# Patient Record
Sex: Female | Born: 1955 | Race: Black or African American | Hispanic: No | Marital: Married | Smoking: Never smoker
Health system: Southern US, Community
[De-identification: ages and names within clinical notes are randomized; demographics above are authoritative.]

## PROBLEM LIST (undated history)

## (undated) DIAGNOSIS — I739 Peripheral vascular disease, unspecified: Secondary | ICD-10-CM

## (undated) DIAGNOSIS — K859 Acute pancreatitis without necrosis or infection, unspecified: Secondary | ICD-10-CM

## (undated) DIAGNOSIS — I10 Essential (primary) hypertension: Secondary | ICD-10-CM

## (undated) DIAGNOSIS — C539 Malignant neoplasm of cervix uteri, unspecified: Secondary | ICD-10-CM

## (undated) DIAGNOSIS — E785 Hyperlipidemia, unspecified: Secondary | ICD-10-CM

## (undated) DIAGNOSIS — R011 Cardiac murmur, unspecified: Secondary | ICD-10-CM

## (undated) DIAGNOSIS — E1169 Type 2 diabetes mellitus with other specified complication: Secondary | ICD-10-CM

## (undated) DIAGNOSIS — D649 Anemia, unspecified: Secondary | ICD-10-CM

## (undated) DIAGNOSIS — N189 Chronic kidney disease, unspecified: Secondary | ICD-10-CM

## (undated) DIAGNOSIS — T4145XA Adverse effect of unspecified anesthetic, initial encounter: Secondary | ICD-10-CM

## (undated) DIAGNOSIS — R87613 High grade squamous intraepithelial lesion on cytologic smear of cervix (HGSIL): Secondary | ICD-10-CM

## (undated) DIAGNOSIS — N186 End stage renal disease: Secondary | ICD-10-CM

## (undated) DIAGNOSIS — E119 Type 2 diabetes mellitus without complications: Secondary | ICD-10-CM

## (undated) DIAGNOSIS — T8859XA Other complications of anesthesia, initial encounter: Secondary | ICD-10-CM

## (undated) HISTORY — PX: CATARACT EXTRACTION: SUR2

## (undated) HISTORY — DX: Malignant neoplasm of cervix uteri, unspecified: C53.9

## (undated) HISTORY — DX: High grade squamous intraepithelial lesion on cytologic smear of cervix (HGSIL): R87.613

## (undated) HISTORY — PX: COLONOSCOPY: SHX174

## (undated) NOTE — *Deleted (*Deleted)
Cisplatin injection What is this medicine? CISPLATIN (SIS pla tin) is a chemotherapy drug. It targets fast dividing cells, like cancer cells, and causes these cells to die. This medicine is used to treat many types of cancer like bladder, ovarian, and testicular cancers. This medicine may be used for other purposes; ask your health care provider or pharmacist if you have questions. COMMON BRAND NAME(S): Platinol, Platinol -AQ What should I tell my health care provider before I take this medicine? They need to know if you have any of these conditions:  eye disease, vision problems  hearing problems  kidney disease  low blood counts, like white cells, platelets, or red blood cells  tingling of the fingers or toes, or other nerve disorder  an unusual or allergic reaction to cisplatin, carboplatin, oxaliplatin, other medicines, foods, dyes, or preservatives  pregnant or trying to get pregnant  breast-feeding How should I use this medicine? This drug is given as an infusion into a vein. It is administered in a hospital or clinic by a specially trained health care professional. Talk to your pediatrician regarding the use of this medicine in children. Special care may be needed. Overdosage: If you think you have taken too much of this medicine contact a poison control center or emergency room at once. NOTE: This medicine is only for you. Do not share this medicine with others. What if I miss a dose? It is important not to miss a dose. Call your doctor or health care professional if you are unable to keep an appointment. What may interact with this medicine? This medicine may interact with the following medications:  foscarnet  certain antibiotics like amikacin, gentamicin, neomycin, polymyxin B, streptomycin, tobramycin, vancomycin This list may not describe all possible interactions. Give your health care provider a list of all the medicines, herbs, non-prescription drugs, or dietary  supplements you use. Also tell them if you smoke, drink alcohol, or use illegal drugs. Some items may interact with your medicine. What should I watch for while using this medicine? Your condition will be monitored carefully while you are receiving this medicine. You will need important blood work done while you are taking this medicine. This drug may make you feel generally unwell. This is not uncommon, as chemotherapy can affect healthy cells as well as cancer cells. Report any side effects. Continue your course of treatment even though you feel ill unless your doctor tells you to stop. This medicine may increase your risk of getting an infection. Call your healthcare professional for advice if you get a fever, chills, or sore throat, or other symptoms of a cold or flu. Do not treat yourself. Try to avoid being around people who are sick. Avoid taking medicines that contain aspirin, acetaminophen, ibuprofen, naproxen, or ketoprofen unless instructed by your healthcare professional. These medicines may hide a fever. This medicine may increase your risk to bruise or bleed. Call your doctor or health care professional if you notice any unusual bleeding. Be careful brushing and flossing your teeth or using a toothpick because you may get an infection or bleed more easily. If you have any dental work done, tell your dentist you are receiving this medicine. Do not become pregnant while taking this medicine or for 14 months after stopping it. Women should inform their healthcare professional if they wish to become pregnant or think they might be pregnant. Men should not father a child while taking this medicine and for 11 months after stopping it. There is potential for   serious side effects to an unborn child. Talk to your healthcare professional for more information. Do not breast-feed an infant while taking this medicine. This medicine has caused ovarian failure in some women. This medicine may make it more  difficult to get pregnant. Talk to your healthcare professional if you are concerned about your fertility. This medicine has caused decreased sperm counts in some men. This may make it more difficult to father a child. Talk to your healthcare professional if you are concerned about your fertility. Drink fluids as directed while you are taking this medicine. This will help protect your kidneys. Call your doctor or health care professional if you get diarrhea. Do not treat yourself. What side effects may I notice from receiving this medicine? Side effects that you should report to your doctor or health care professional as soon as possible:  allergic reactions like skin rash, itching or hives, swelling of the face, lips, or tongue  blurred vision  changes in vision  decreased hearing or ringing of the ears  nausea, vomiting  pain, redness, or irritation at site where injected  pain, tingling, numbness in the hands or feet  signs and symptoms of bleeding such as bloody or black, tarry stools; red or dark brown urine; spitting up blood or brown material that looks like coffee grounds; red spots on the skin; unusual bruising or bleeding from the eyes, gums, or nose  signs and symptoms of infection like fever; chills; cough; sore throat; pain or trouble passing urine  signs and symptoms of kidney injury like trouble passing urine or change in the amount of urine  signs and symptoms of low red blood cells or anemia such as unusually weak or tired; feeling faint or lightheaded; falls; breathing problems Side effects that usually do not require medical attention (report to your doctor or health care professional if they continue or are bothersome):  loss of appetite  mouth sores  muscle cramps This list may not describe all possible side effects. Call your doctor for medical advice about side effects. You may report side effects to FDA at 1-800-FDA-1088. Where should I keep my  medicine? This drug is given in a hospital or clinic and will not be stored at home. NOTE: This sheet is a summary. It may not cover all possible information. If you have questions about this medicine, talk to your doctor, pharmacist, or health care provider.  2020 Elsevier/Gold Standard (2018-08-24 15:59:17)  

---

## 1982-09-12 HISTORY — PX: TUBAL LIGATION: SHX77

## 2004-08-03 ENCOUNTER — Ambulatory Visit: Payer: Self-pay | Admitting: Internal Medicine

## 2004-08-24 ENCOUNTER — Ambulatory Visit: Payer: Self-pay | Admitting: Pain Medicine

## 2004-09-16 ENCOUNTER — Ambulatory Visit: Payer: Self-pay | Admitting: Pain Medicine

## 2004-09-30 ENCOUNTER — Ambulatory Visit: Payer: Self-pay | Admitting: Pain Medicine

## 2004-10-14 ENCOUNTER — Ambulatory Visit: Payer: Self-pay | Admitting: Pain Medicine

## 2005-05-08 ENCOUNTER — Emergency Department: Payer: Self-pay | Admitting: Emergency Medicine

## 2006-02-24 ENCOUNTER — Emergency Department: Payer: Self-pay | Admitting: Emergency Medicine

## 2006-05-25 ENCOUNTER — Emergency Department: Payer: Self-pay | Admitting: Unknown Physician Specialty

## 2006-09-20 ENCOUNTER — Inpatient Hospital Stay: Payer: Self-pay | Admitting: Internal Medicine

## 2007-04-19 ENCOUNTER — Ambulatory Visit: Payer: Self-pay | Admitting: Internal Medicine

## 2007-10-01 ENCOUNTER — Ambulatory Visit: Payer: Self-pay | Admitting: Pain Medicine

## 2007-10-15 ENCOUNTER — Ambulatory Visit: Payer: Self-pay | Admitting: Pain Medicine

## 2007-10-30 ENCOUNTER — Ambulatory Visit: Payer: Self-pay | Admitting: Pain Medicine

## 2008-05-06 ENCOUNTER — Ambulatory Visit: Payer: Self-pay | Admitting: Family Medicine

## 2008-08-29 ENCOUNTER — Emergency Department: Payer: Self-pay | Admitting: Emergency Medicine

## 2008-09-01 ENCOUNTER — Ambulatory Visit: Payer: Self-pay | Admitting: Unknown Physician Specialty

## 2009-05-22 ENCOUNTER — Inpatient Hospital Stay: Payer: Self-pay | Admitting: Internal Medicine

## 2009-07-07 ENCOUNTER — Ambulatory Visit: Payer: Self-pay | Admitting: Family Medicine

## 2009-07-13 ENCOUNTER — Ambulatory Visit: Payer: Self-pay | Admitting: Family Medicine

## 2009-10-06 ENCOUNTER — Emergency Department: Payer: Self-pay | Admitting: Emergency Medicine

## 2010-04-16 ENCOUNTER — Emergency Department: Payer: Self-pay | Admitting: Emergency Medicine

## 2010-12-15 ENCOUNTER — Encounter: Payer: Self-pay | Admitting: Nurse Practitioner

## 2011-02-28 ENCOUNTER — Ambulatory Visit: Payer: Self-pay | Admitting: Internal Medicine

## 2011-03-01 ENCOUNTER — Ambulatory Visit: Payer: Self-pay | Admitting: Internal Medicine

## 2011-09-13 HISTORY — PX: AMPUTATION TOE: SHX6595

## 2011-10-27 ENCOUNTER — Emergency Department: Payer: Self-pay | Admitting: Emergency Medicine

## 2011-10-28 ENCOUNTER — Emergency Department: Payer: Self-pay | Admitting: Emergency Medicine

## 2011-10-28 LAB — URINALYSIS, COMPLETE
Bilirubin,UR: NEGATIVE
Blood: NEGATIVE
Glucose,UR: 50 mg/dL (ref 0–75)
Leukocyte Esterase: NEGATIVE
Nitrite: NEGATIVE
Ph: 5 (ref 4.5–8.0)
Protein: 100
RBC,UR: 2 /HPF (ref 0–5)
Specific Gravity: 1.029 (ref 1.003–1.030)
Squamous Epithelial: 3
WBC UR: 6 /HPF (ref 0–5)

## 2011-10-28 LAB — COMPREHENSIVE METABOLIC PANEL
Albumin: 3.7 g/dL (ref 3.4–5.0)
Alkaline Phosphatase: 37 U/L — ABNORMAL LOW (ref 50–136)
Anion Gap: 10 (ref 7–16)
BUN: 11 mg/dL (ref 7–18)
Bilirubin,Total: 0.5 mg/dL (ref 0.2–1.0)
Calcium, Total: 9.3 mg/dL (ref 8.5–10.1)
Chloride: 101 mmol/L (ref 98–107)
Co2: 28 mmol/L (ref 21–32)
Creatinine: 0.77 mg/dL (ref 0.60–1.30)
EGFR (African American): >60
EGFR (Non-African Amer.): >60
Glucose: 147 mg/dL — ABNORMAL HIGH (ref 65–99)
Osmolality: 280 (ref 275–301)
Potassium: 3.8 mmol/L (ref 3.5–5.1)
SGOT(AST): 14 U/L — ABNORMAL LOW (ref 15–37)
SGPT (ALT): 23 U/L
Sodium: 139 mmol/L (ref 136–145)
Total Protein: 8.2 g/dL (ref 6.4–8.2)

## 2011-10-28 LAB — CBC
HCT: 42.1 % (ref 35.0–47.0)
HGB: 14.2 g/dL (ref 12.0–16.0)
MCH: 30 pg (ref 26.0–34.0)
MCHC: 33.7 g/dL (ref 32.0–36.0)
MCV: 89 fL (ref 80–100)
Platelet: 304 10*3/uL (ref 150–440)
RBC: 4.74 10*6/uL (ref 3.80–5.20)
RDW: 12.8 % (ref 11.5–14.5)
WBC: 6 10*3/uL (ref 3.6–11.0)

## 2011-10-28 LAB — LIPASE, BLOOD: Lipase: 1966 U/L — ABNORMAL HIGH (ref 73–393)

## 2011-10-29 ENCOUNTER — Inpatient Hospital Stay: Payer: Self-pay | Admitting: Student

## 2011-10-29 LAB — CBC WITH DIFFERENTIAL/PLATELET
Basophil #: 0 10*3/uL (ref 0.0–0.1)
Basophil %: 0.4 %
Eosinophil #: 0.1 10*3/uL (ref 0.0–0.7)
Eosinophil %: 1.1 %
HCT: 43 % (ref 35.0–47.0)
HGB: 14.4 g/dL (ref 12.0–16.0)
Lymphocyte #: 1.6 10*3/uL (ref 1.0–3.6)
Lymphocyte %: 22.6 %
MCH: 29.8 pg (ref 26.0–34.0)
MCHC: 33.5 g/dL (ref 32.0–36.0)
MCV: 89 fL (ref 80–100)
Monocyte #: 0.4 10*3/uL (ref 0.0–0.7)
Monocyte %: 6.1 %
Neutrophil #: 4.8 10*3/uL (ref 1.4–6.5)
Neutrophil %: 69.8 %
Platelet: 284 10*3/uL (ref 150–440)
RBC: 4.83 10*6/uL (ref 3.80–5.20)
RDW: 12.6 % (ref 11.5–14.5)
WBC: 6.9 10*3/uL (ref 3.6–11.0)

## 2011-10-29 LAB — URINALYSIS, COMPLETE
Bilirubin,UR: NEGATIVE
Blood: NEGATIVE
Glucose,UR: 50 mg/dL (ref 0–75)
Leukocyte Esterase: NEGATIVE
Nitrite: NEGATIVE
Ph: 5 (ref 4.5–8.0)
Protein: 30
RBC,UR: 1 /HPF (ref 0–5)
Specific Gravity: 1.023 (ref 1.003–1.030)
Squamous Epithelial: 3
WBC UR: 4 /HPF (ref 0–5)

## 2011-10-29 LAB — COMPREHENSIVE METABOLIC PANEL
Albumin: 3.8 g/dL (ref 3.4–5.0)
Alkaline Phosphatase: 36 U/L — ABNORMAL LOW (ref 50–136)
Anion Gap: 12 (ref 7–16)
BUN: 12 mg/dL (ref 7–18)
Bilirubin,Total: 0.6 mg/dL (ref 0.2–1.0)
Calcium, Total: 9.5 mg/dL (ref 8.5–10.1)
Chloride: 100 mmol/L (ref 98–107)
Co2: 27 mmol/L (ref 21–32)
Creatinine: 0.61 mg/dL (ref 0.60–1.30)
EGFR (African American): >60
EGFR (Non-African Amer.): >60
Glucose: 161 mg/dL — ABNORMAL HIGH (ref 65–99)
Osmolality: 281 (ref 275–301)
Potassium: 4.5 mmol/L (ref 3.5–5.1)
SGOT(AST): 29 U/L (ref 15–37)
SGPT (ALT): 23 U/L
Sodium: 139 mmol/L (ref 136–145)
Total Protein: 8.6 g/dL — ABNORMAL HIGH (ref 6.4–8.2)

## 2011-10-29 LAB — LIPASE, BLOOD: Lipase: 1162 U/L — ABNORMAL HIGH (ref 73–393)

## 2011-10-30 LAB — LIPID PANEL
Cholesterol: 133 mg/dL (ref 0–200)
HDL Cholesterol: 55 mg/dL (ref 40–60)
Ldl Cholesterol, Calc: 64 mg/dL (ref 0–100)
Triglycerides: 72 mg/dL (ref 0–200)
VLDL Cholesterol, Calc: 14 mg/dL (ref 5–40)

## 2011-10-30 LAB — COMPREHENSIVE METABOLIC PANEL
Albumin: 3.2 g/dL — ABNORMAL LOW (ref 3.4–5.0)
Alkaline Phosphatase: 31 U/L — ABNORMAL LOW (ref 50–136)
Anion Gap: 12 (ref 7–16)
BUN: 10 mg/dL (ref 7–18)
Bilirubin,Total: 0.3 mg/dL (ref 0.2–1.0)
Calcium, Total: 8.4 mg/dL — ABNORMAL LOW (ref 8.5–10.1)
Chloride: 103 mmol/L (ref 98–107)
Co2: 25 mmol/L (ref 21–32)
Creatinine: 0.75 mg/dL (ref 0.60–1.30)
EGFR (African American): >60
EGFR (Non-African Amer.): >60
Glucose: 135 mg/dL — ABNORMAL HIGH (ref 65–99)
Osmolality: 280 (ref 275–301)
Potassium: 3.7 mmol/L (ref 3.5–5.1)
SGOT(AST): 13 U/L — ABNORMAL LOW (ref 15–37)
SGPT (ALT): 16 U/L
Sodium: 140 mmol/L (ref 136–145)
Total Protein: 7.1 g/dL (ref 6.4–8.2)

## 2011-10-30 LAB — CBC WITH DIFFERENTIAL/PLATELET
Basophil #: 0.1 10*3/uL (ref 0.0–0.1)
Basophil %: 0.9 %
Eosinophil #: 0.1 10*3/uL (ref 0.0–0.7)
Eosinophil %: 1.1 %
HCT: 37.8 % (ref 35.0–47.0)
HGB: 12.5 g/dL (ref 12.0–16.0)
Lymphocyte #: 2.1 10*3/uL (ref 1.0–3.6)
Lymphocyte %: 35.4 %
MCH: 29.5 pg (ref 26.0–34.0)
MCHC: 33 g/dL (ref 32.0–36.0)
MCV: 89 fL (ref 80–100)
Monocyte #: 0.4 10*3/uL (ref 0.0–0.7)
Monocyte %: 6.9 %
Neutrophil #: 3.4 10*3/uL (ref 1.4–6.5)
Neutrophil %: 55.7 %
Platelet: 283 10*3/uL (ref 150–440)
RBC: 4.23 10*6/uL (ref 3.80–5.20)
RDW: 12.3 % (ref 11.5–14.5)
WBC: 6 10*3/uL (ref 3.6–11.0)

## 2011-10-30 LAB — LIPASE, BLOOD: Lipase: 775 U/L — ABNORMAL HIGH (ref 73–393)

## 2011-10-31 LAB — URINE CULTURE

## 2011-10-31 LAB — LIPASE, BLOOD: Lipase: 581 U/L — ABNORMAL HIGH (ref 73–393)

## 2011-11-01 LAB — HEPATIC FUNCTION PANEL A (ARMC)
Albumin: 3.3 g/dL — ABNORMAL LOW (ref 3.4–5.0)
Alkaline Phosphatase: 43 U/L — ABNORMAL LOW (ref 50–136)
Bilirubin, Direct: <0.1 mg/dL (ref 0.00–0.20)
Bilirubin,Total: 0.4 mg/dL (ref 0.2–1.0)
SGOT(AST): 18 U/L (ref 15–37)
SGPT (ALT): 21 U/L
Total Protein: 7.6 g/dL (ref 6.4–8.2)

## 2011-11-01 LAB — LIPASE, BLOOD: Lipase: 369 U/L (ref 73–393)

## 2011-11-03 LAB — PATHOLOGY REPORT

## 2012-01-03 ENCOUNTER — Emergency Department: Payer: Self-pay | Admitting: Emergency Medicine

## 2012-01-03 LAB — CBC
HCT: 41.2 % (ref 35.0–47.0)
HGB: 13.4 g/dL (ref 12.0–16.0)
MCH: 29.3 pg (ref 26.0–34.0)
MCHC: 32.4 g/dL (ref 32.0–36.0)
MCV: 91 fL (ref 80–100)
Platelet: 313 10*3/uL (ref 150–440)
RBC: 4.56 10*6/uL (ref 3.80–5.20)
RDW: 13.2 % (ref 11.5–14.5)
WBC: 4.6 10*3/uL (ref 3.6–11.0)

## 2012-01-03 LAB — COMPREHENSIVE METABOLIC PANEL
Albumin: 3.7 g/dL (ref 3.4–5.0)
Alkaline Phosphatase: 45 U/L — ABNORMAL LOW (ref 50–136)
Anion Gap: 6 — ABNORMAL LOW (ref 7–16)
BUN: 11 mg/dL (ref 7–18)
Bilirubin,Total: 0.3 mg/dL (ref 0.2–1.0)
Calcium, Total: 9.2 mg/dL (ref 8.5–10.1)
Chloride: 105 mmol/L (ref 98–107)
Co2: 29 mmol/L (ref 21–32)
Creatinine: 0.75 mg/dL (ref 0.60–1.30)
EGFR (African American): >60
EGFR (Non-African Amer.): >60
Glucose: 180 mg/dL — ABNORMAL HIGH (ref 65–99)
Osmolality: 283 (ref 275–301)
Potassium: 4 mmol/L (ref 3.5–5.1)
SGOT(AST): 21 U/L (ref 15–37)
SGPT (ALT): 23 U/L
Sodium: 140 mmol/L (ref 136–145)
Total Protein: 7.7 g/dL (ref 6.4–8.2)

## 2012-01-03 LAB — TROPONIN I: Troponin-I: <0.02 ng/mL

## 2012-05-21 ENCOUNTER — Ambulatory Visit: Payer: Self-pay | Admitting: Internal Medicine

## 2012-08-20 ENCOUNTER — Inpatient Hospital Stay: Payer: Self-pay | Admitting: Internal Medicine

## 2012-08-20 LAB — COMPREHENSIVE METABOLIC PANEL
Albumin: 3.3 g/dL — ABNORMAL LOW (ref 3.4–5.0)
Alkaline Phosphatase: 61 U/L (ref 50–136)
Anion Gap: 6 — ABNORMAL LOW (ref 7–16)
BUN: 12 mg/dL (ref 7–18)
Bilirubin,Total: 0.2 mg/dL (ref 0.2–1.0)
Calcium, Total: 9.6 mg/dL (ref 8.5–10.1)
Chloride: 101 mmol/L (ref 98–107)
Co2: 28 mmol/L (ref 21–32)
Creatinine: 0.9 mg/dL (ref 0.60–1.30)
EGFR (African American): >60
EGFR (Non-African Amer.): >60
Glucose: 399 mg/dL — ABNORMAL HIGH (ref 65–99)
Osmolality: 287 (ref 275–301)
Potassium: 4 mmol/L (ref 3.5–5.1)
SGOT(AST): 11 U/L — ABNORMAL LOW (ref 15–37)
SGPT (ALT): 24 U/L (ref 12–78)
Sodium: 135 mmol/L — ABNORMAL LOW (ref 136–145)
Total Protein: 7.8 g/dL (ref 6.4–8.2)

## 2012-08-20 LAB — URINALYSIS, COMPLETE
Bacteria: NONE SEEN
Bilirubin,UR: NEGATIVE
Glucose,UR: >=500 mg/dL (ref 0–75)
Ketone: NEGATIVE
Leukocyte Esterase: NEGATIVE
Nitrite: NEGATIVE
Ph: 6 (ref 4.5–8.0)
Protein: NEGATIVE
RBC,UR: <1 /HPF (ref 0–5)
Specific Gravity: 1.035 (ref 1.003–1.030)
Squamous Epithelial: 1
WBC UR: 1 /HPF (ref 0–5)

## 2012-08-20 LAB — CBC
HCT: 41.9 % (ref 35.0–47.0)
HGB: 13.6 g/dL (ref 12.0–16.0)
MCH: 29.1 pg (ref 26.0–34.0)
MCHC: 32.5 g/dL (ref 32.0–36.0)
MCV: 90 fL (ref 80–100)
Platelet: 293 10*3/uL (ref 150–440)
RBC: 4.69 10*6/uL (ref 3.80–5.20)
RDW: 12.8 % (ref 11.5–14.5)
WBC: 6.8 10*3/uL (ref 3.6–11.0)

## 2012-08-20 LAB — LIPASE, BLOOD: Lipase: 1106 U/L — ABNORMAL HIGH (ref 73–393)

## 2012-08-21 LAB — CBC WITH DIFFERENTIAL/PLATELET
Basophil #: 0.1 10*3/uL (ref 0.0–0.1)
Basophil %: 0.9 %
Eosinophil #: 0.1 10*3/uL (ref 0.0–0.7)
Eosinophil %: 2.2 %
HCT: 36.7 % (ref 35.0–47.0)
HGB: 12.9 g/dL (ref 12.0–16.0)
Lymphocyte #: 2.4 10*3/uL (ref 1.0–3.6)
Lymphocyte %: 39.7 %
MCH: 30.9 pg (ref 26.0–34.0)
MCHC: 35.1 g/dL (ref 32.0–36.0)
MCV: 88 fL (ref 80–100)
Monocyte #: 0.5 x10 3/mm (ref 0.2–0.9)
Monocyte %: 8.2 %
Neutrophil #: 3 10*3/uL (ref 1.4–6.5)
Neutrophil %: 49 %
Platelet: 260 10*3/uL (ref 150–440)
RBC: 4.16 10*6/uL (ref 3.80–5.20)
RDW: 12.4 % (ref 11.5–14.5)
WBC: 6.1 10*3/uL (ref 3.6–11.0)

## 2012-08-21 LAB — BASIC METABOLIC PANEL
Anion Gap: 6 — ABNORMAL LOW (ref 7–16)
BUN: 8 mg/dL (ref 7–18)
Calcium, Total: 8.2 mg/dL — ABNORMAL LOW (ref 8.5–10.1)
Chloride: 110 mmol/L — ABNORMAL HIGH (ref 98–107)
Co2: 25 mmol/L (ref 21–32)
Creatinine: 0.58 mg/dL — ABNORMAL LOW (ref 0.60–1.30)
EGFR (African American): >60
EGFR (Non-African Amer.): >60
Glucose: 224 mg/dL — ABNORMAL HIGH (ref 65–99)
Osmolality: 287 (ref 275–301)
Potassium: 3.8 mmol/L (ref 3.5–5.1)
Sodium: 141 mmol/L (ref 136–145)

## 2012-08-21 LAB — CK TOTAL AND CKMB (NOT AT ARMC)
CK, Total: 56 U/L (ref 21–215)
CK, Total: 42 U/L (ref 21–215)
CK, Total: 41 U/L (ref 21–215)
CK-MB: 1.6 ng/mL (ref 0.5–3.6)
CK-MB: 1.2 ng/mL (ref 0.5–3.6)
CK-MB: 1 ng/mL (ref 0.5–3.6)

## 2012-08-21 LAB — TROPONIN I
Troponin-I: <0.02 ng/mL
Troponin-I: <0.02 ng/mL
Troponin-I: <0.02 ng/mL

## 2012-08-22 LAB — LIPASE, BLOOD: Lipase: 517 U/L — ABNORMAL HIGH (ref 73–393)

## 2012-09-12 HISTORY — PX: CHOLECYSTECTOMY: SHX55

## 2013-08-07 ENCOUNTER — Ambulatory Visit: Payer: Self-pay | Admitting: Internal Medicine

## 2013-09-21 ENCOUNTER — Emergency Department: Payer: Self-pay | Admitting: Internal Medicine

## 2013-12-09 ENCOUNTER — Inpatient Hospital Stay: Payer: Self-pay | Admitting: Student

## 2013-12-09 LAB — URINALYSIS, COMPLETE
Bilirubin,UR: NEGATIVE
Glucose,UR: >=500 mg/dL (ref 0–75)
Leukocyte Esterase: NEGATIVE
Nitrite: NEGATIVE
Ph: 5 (ref 4.5–8.0)
Protein: 100
RBC,UR: 5 /HPF (ref 0–5)
Specific Gravity: 1.031 (ref 1.003–1.030)
Squamous Epithelial: 2
WBC UR: 3 /HPF (ref 0–5)

## 2013-12-09 LAB — COMPREHENSIVE METABOLIC PANEL
Albumin: 3.3 g/dL — ABNORMAL LOW (ref 3.4–5.0)
Alkaline Phosphatase: 58 U/L
Anion Gap: 4 — ABNORMAL LOW (ref 7–16)
BUN: 11 mg/dL (ref 7–18)
Bilirubin,Total: 0.5 mg/dL (ref 0.2–1.0)
Calcium, Total: 9.1 mg/dL (ref 8.5–10.1)
Chloride: 102 mmol/L (ref 98–107)
Co2: 29 mmol/L (ref 21–32)
Creatinine: 0.79 mg/dL (ref 0.60–1.30)
EGFR (African American): >60
EGFR (Non-African Amer.): >60
Glucose: 231 mg/dL — ABNORMAL HIGH (ref 65–99)
Osmolality: 277 (ref 275–301)
Potassium: 4.1 mmol/L (ref 3.5–5.1)
SGOT(AST): 13 U/L — ABNORMAL LOW (ref 15–37)
SGPT (ALT): 20 U/L (ref 12–78)
Sodium: 135 mmol/L — ABNORMAL LOW (ref 136–145)
Total Protein: 8 g/dL (ref 6.4–8.2)

## 2013-12-09 LAB — CBC WITH DIFFERENTIAL/PLATELET
Basophil #: 0.1 10*3/uL (ref 0.0–0.1)
Basophil %: 1.1 %
Eosinophil #: 0.1 10*3/uL (ref 0.0–0.7)
Eosinophil %: 1.5 %
HCT: 44.4 % (ref 35.0–47.0)
HGB: 14.2 g/dL (ref 12.0–16.0)
Lymphocyte #: 1.6 10*3/uL (ref 1.0–3.6)
Lymphocyte %: 24.9 %
MCH: 28.3 pg (ref 26.0–34.0)
MCHC: 32 g/dL (ref 32.0–36.0)
MCV: 89 fL (ref 80–100)
Monocyte #: 0.3 x10 3/mm (ref 0.2–0.9)
Monocyte %: 5.3 %
Neutrophil #: 4.3 10*3/uL (ref 1.4–6.5)
Neutrophil %: 67.2 %
Platelet: 277 10*3/uL (ref 150–440)
RBC: 5.02 10*6/uL (ref 3.80–5.20)
RDW: 12.9 % (ref 11.5–14.5)
WBC: 6.4 10*3/uL (ref 3.6–11.0)

## 2013-12-09 LAB — LIPASE, BLOOD: Lipase: 875 U/L — ABNORMAL HIGH (ref 73–393)

## 2013-12-09 LAB — TROPONIN I: Troponin-I: <0.02 ng/mL

## 2013-12-10 LAB — CBC WITH DIFFERENTIAL/PLATELET
Basophil #: 0 10*3/uL (ref 0.0–0.1)
Basophil %: 0.8 %
Eosinophil #: 0.2 10*3/uL (ref 0.0–0.7)
Eosinophil %: 3.3 %
HCT: 37.4 % (ref 35.0–47.0)
HGB: 12.2 g/dL (ref 12.0–16.0)
Lymphocyte #: 2.6 10*3/uL (ref 1.0–3.6)
Lymphocyte %: 42.4 %
MCH: 29.1 pg (ref 26.0–34.0)
MCHC: 32.7 g/dL (ref 32.0–36.0)
MCV: 89 fL (ref 80–100)
Monocyte #: 0.5 x10 3/mm (ref 0.2–0.9)
Monocyte %: 7.9 %
Neutrophil #: 2.8 10*3/uL (ref 1.4–6.5)
Neutrophil %: 45.6 %
Platelet: 232 10*3/uL (ref 150–440)
RBC: 4.21 10*6/uL (ref 3.80–5.20)
RDW: 13.1 % (ref 11.5–14.5)
WBC: 6 10*3/uL (ref 3.6–11.0)

## 2013-12-10 LAB — BASIC METABOLIC PANEL
Anion Gap: 6 — ABNORMAL LOW (ref 7–16)
BUN: 15 mg/dL (ref 7–18)
Calcium, Total: 7.9 mg/dL — ABNORMAL LOW (ref 8.5–10.1)
Chloride: 107 mmol/L (ref 98–107)
Co2: 26 mmol/L (ref 21–32)
Creatinine: 0.85 mg/dL (ref 0.60–1.30)
EGFR (African American): >60
EGFR (Non-African Amer.): >60
Glucose: 144 mg/dL — ABNORMAL HIGH (ref 65–99)
Osmolality: 281 (ref 275–301)
Potassium: 3.8 mmol/L (ref 3.5–5.1)
Sodium: 139 mmol/L (ref 136–145)

## 2013-12-10 LAB — LIPASE, BLOOD: Lipase: 697 U/L — ABNORMAL HIGH (ref 73–393)

## 2013-12-10 LAB — MAGNESIUM: Magnesium: 1.8 mg/dL

## 2013-12-11 LAB — LIPASE, BLOOD: Lipase: 535 U/L — ABNORMAL HIGH (ref 73–393)

## 2013-12-11 LAB — LIPID PANEL
Cholesterol: 122 mg/dL (ref 0–200)
HDL Cholesterol: 43 mg/dL (ref 40–60)
Ldl Cholesterol, Calc: 58 mg/dL (ref 0–100)
Triglycerides: 104 mg/dL (ref 0–200)
VLDL Cholesterol, Calc: 21 mg/dL (ref 5–40)

## 2014-06-12 ENCOUNTER — Inpatient Hospital Stay: Payer: Self-pay | Admitting: Internal Medicine

## 2014-06-12 LAB — COMPREHENSIVE METABOLIC PANEL
Albumin: 2.9 g/dL — ABNORMAL LOW (ref 3.4–5.0)
Alkaline Phosphatase: 110 U/L
Anion Gap: 8 (ref 7–16)
BUN: 22 mg/dL — ABNORMAL HIGH (ref 7–18)
Bilirubin,Total: 0.4 mg/dL (ref 0.2–1.0)
Calcium, Total: 8.6 mg/dL (ref 8.5–10.1)
Chloride: 98 mmol/L (ref 98–107)
Co2: 28 mmol/L (ref 21–32)
Creatinine: 0.9 mg/dL (ref 0.60–1.30)
EGFR (African American): >60
EGFR (Non-African Amer.): >60
Glucose: 312 mg/dL — ABNORMAL HIGH (ref 65–99)
Osmolality: 283 (ref 275–301)
Potassium: 4.9 mmol/L (ref 3.5–5.1)
SGOT(AST): 66 U/L — ABNORMAL HIGH (ref 15–37)
SGPT (ALT): 68 U/L — ABNORMAL HIGH
Sodium: 134 mmol/L — ABNORMAL LOW (ref 136–145)
Total Protein: 8 g/dL (ref 6.4–8.2)

## 2014-06-12 LAB — CBC WITH DIFFERENTIAL/PLATELET
Basophil #: 0.1 10*3/uL (ref 0.0–0.1)
Basophil %: 0.7 %
Eosinophil #: 0.1 10*3/uL (ref 0.0–0.7)
Eosinophil %: 1.4 %
HCT: 41.8 % (ref 35.0–47.0)
HGB: 13.3 g/dL (ref 12.0–16.0)
Lymphocyte #: 1.3 10*3/uL (ref 1.0–3.6)
Lymphocyte %: 14.5 %
MCH: 28 pg (ref 26.0–34.0)
MCHC: 31.7 g/dL — ABNORMAL LOW (ref 32.0–36.0)
MCV: 88 fL (ref 80–100)
Monocyte #: 0.9 x10 3/mm (ref 0.2–0.9)
Monocyte %: 10 %
Neutrophil #: 6.5 10*3/uL (ref 1.4–6.5)
Neutrophil %: 73.4 %
Platelet: 232 10*3/uL (ref 150–440)
RBC: 4.74 10*6/uL (ref 3.80–5.20)
RDW: 12.6 % (ref 11.5–14.5)
WBC: 8.9 10*3/uL (ref 3.6–11.0)

## 2014-06-13 LAB — CBC WITH DIFFERENTIAL/PLATELET
Basophil #: 0 10*3/uL (ref 0.0–0.1)
Basophil %: 0.4 %
Eosinophil #: 0.1 10*3/uL (ref 0.0–0.7)
Eosinophil %: 1.6 %
HCT: 38.4 % (ref 35.0–47.0)
HGB: 12.4 g/dL (ref 12.0–16.0)
Lymphocyte #: 1.7 10*3/uL (ref 1.0–3.6)
Lymphocyte %: 19.8 %
MCH: 28.5 pg (ref 26.0–34.0)
MCHC: 32.3 g/dL (ref 32.0–36.0)
MCV: 88 fL (ref 80–100)
Monocyte #: 1.1 x10 3/mm — ABNORMAL HIGH (ref 0.2–0.9)
Monocyte %: 13.4 %
Neutrophil #: 5.4 10*3/uL (ref 1.4–6.5)
Neutrophil %: 64.8 %
Platelet: 231 10*3/uL (ref 150–440)
RBC: 4.36 10*6/uL (ref 3.80–5.20)
RDW: 12.6 % (ref 11.5–14.5)
WBC: 8.4 10*3/uL (ref 3.6–11.0)

## 2014-06-13 LAB — BASIC METABOLIC PANEL
Anion Gap: 5 — ABNORMAL LOW (ref 7–16)
BUN: 18 mg/dL (ref 7–18)
Calcium, Total: 8.4 mg/dL — ABNORMAL LOW (ref 8.5–10.1)
Chloride: 98 mmol/L (ref 98–107)
Co2: 30 mmol/L (ref 21–32)
Creatinine: 1.01 mg/dL (ref 0.60–1.30)
EGFR (African American): >60
EGFR (Non-African Amer.): >60
Glucose: 290 mg/dL — ABNORMAL HIGH (ref 65–99)
Osmolality: 279 (ref 275–301)
Potassium: 4 mmol/L (ref 3.5–5.1)
Sodium: 133 mmol/L — ABNORMAL LOW (ref 136–145)

## 2014-06-13 LAB — SEDIMENTATION RATE: Erythrocyte Sed Rate: 57 mm/hr — ABNORMAL HIGH (ref 0–30)

## 2014-06-13 LAB — HEMOGLOBIN A1C: Hemoglobin A1C: 11.2 % — ABNORMAL HIGH (ref 4.2–6.3)

## 2014-06-14 LAB — BASIC METABOLIC PANEL
Anion Gap: 5 — ABNORMAL LOW (ref 7–16)
BUN: 21 mg/dL — ABNORMAL HIGH (ref 7–18)
Calcium, Total: 8.4 mg/dL — ABNORMAL LOW (ref 8.5–10.1)
Chloride: 105 mmol/L (ref 98–107)
Co2: 29 mmol/L (ref 21–32)
Creatinine: 1.04 mg/dL (ref 0.60–1.30)
EGFR (African American): >60
EGFR (Non-African Amer.): 58 — ABNORMAL LOW
Glucose: 152 mg/dL — ABNORMAL HIGH (ref 65–99)
Osmolality: 283 (ref 275–301)
Potassium: 3.6 mmol/L (ref 3.5–5.1)
Sodium: 139 mmol/L (ref 136–145)

## 2014-06-14 LAB — CBC WITH DIFFERENTIAL/PLATELET
Basophil #: 0.1 10*3/uL (ref 0.0–0.1)
Basophil %: 1 %
Eosinophil #: 0.2 10*3/uL (ref 0.0–0.7)
Eosinophil %: 2.4 %
HCT: 36.1 % (ref 35.0–47.0)
HGB: 11.8 g/dL — ABNORMAL LOW (ref 12.0–16.0)
Lymphocyte #: 2.8 10*3/uL (ref 1.0–3.6)
Lymphocyte %: 35.1 %
MCH: 28.7 pg (ref 26.0–34.0)
MCHC: 32.7 g/dL (ref 32.0–36.0)
MCV: 88 fL (ref 80–100)
Monocyte #: 0.9 x10 3/mm (ref 0.2–0.9)
Monocyte %: 11.7 %
Neutrophil #: 3.9 10*3/uL (ref 1.4–6.5)
Neutrophil %: 49.8 %
Platelet: 236 10*3/uL (ref 150–440)
RBC: 4.12 10*6/uL (ref 3.80–5.20)
RDW: 12.7 % (ref 11.5–14.5)
WBC: 7.9 10*3/uL (ref 3.6–11.0)

## 2014-06-17 LAB — CULTURE, BLOOD (SINGLE)

## 2014-06-18 LAB — WOUND CULTURE

## 2014-06-18 LAB — PATHOLOGY REPORT

## 2014-10-06 ENCOUNTER — Ambulatory Visit: Payer: Self-pay | Admitting: Internal Medicine

## 2014-12-30 NOTE — Consult Note (Signed)
PATIENT NAME:  Tracy Jennings, Tracy Jennings MR#:  [id number] DATE OF BIRTH:  1955/10/06  DATE OF CONSULTATION:  08/22/2012  REFERRING PHYSICIAN:  Dr. Fritzi Mandes  CONSULTING PHYSICIAN:  Jill Side, MD  REASON FOR CONSULTATION: Recurrent pancreatitis.   HISTORY OF PRESENT ILLNESS: 59 year old female with history of multiple episodes of pancreatitis in the last three years. Patient underwent cholecystectomy in February 2013 due to gallbladder sludge. Patient presented to the hospital yesterday again with abdominal pain and a serum lipase of 1100. Patient's symptoms started about a week ago with mainly upper abdominal and epigastric pain followed by vomiting. When evaluated yesterday she was feeling better with less abdominal pain and denies any further vomiting.   PAST MEDICAL HISTORY:  1. History of hypertension. 2. Diabetes.  3. At least three episodes of acute pancreatitis since 2010.   PAST SURGICAL HISTORY:  1. Cholecystectomy. 2. Tubal ligation.  3. Cataract surgery.   ALLERGIES: Sulfa.   MEDICINES AT HOME:  1. Losartan. 2. NovoLog. 3. Lantus.   FAMILY HISTORY: Significant for hyperglycemia, hypertension and CVA but no history of pancreatitis in the family.   SOCIAL HISTORY: She denies smoking or drinking alcohol.   REVIEW OF SYSTEMS: Grossly negative except for what is mentioned in the History of Present Illness.   PHYSICAL EXAMINATION:  GENERAL: Fairly well built female, somewhat obese, does not appear to be in any acute distress.   VITAL SIGNS: Temperature 98.1, pulse 87, respirations 18 to 20, blood pressure 140/80.   HEENT: Unremarkable. There is no jaundice.   NECK: Neck veins are flat.   LUNGS: Grossly clear to auscultation bilaterally with fair entry and no added sounds.   CARDIOVASCULAR: Regular rate and rhythm.   ABDOMEN: Quite soft and benign. Bowel sounds are positive. No significant abdominal tenderness was noted. There is no rebound or guarding.    EXTREMITIES: No edema.   NEUROLOGIC: Examination appears to be unremarkable.   LABORATORY, DIAGNOSTIC, AND RADIOLOGICAL DATA: Labs on arrival: Patient's lipase was 1100, is 571 today. Liver enzymes are normal. BUN and creatinine normal. White cell count 4.6, hemoglobin 13.4, hematocrit 41.2, platelet count 313. Ultrasound showed somewhat heterogeneous enlargement of the body and tail of pancreas. CBD measures 4 mm and no other significant abnormalities were noted.   ASSESSMENT AND PLAN: Patient with recurrent pancreatitis. There is no clear etiology for her recurrent pancreatitis. She does not drink and is status post cholecystectomy. On all previous episodes of pancreatitis her liver enzymes have been normal. Patient had normal triglycerides in the past as well. I do not see any medicine on her list of medications that would cause recurrent pancreatitis. Ultrasound showed somewhat enlarged pancreas. Review of her records show that patient has not had any CT scan done in the last several years. I will proceed with a CT scan of the abdomen to rule out etiologies such as autoimmune pancreatitis. Hereditary pancreatitis would be another consideration, although the patient is in her 88s and her episodes of pancreatitis did not start until about three years ago and she has no family history of pancreatitis. Agree with clear liquid diet, advance as tolerated. Further recommendations to follow depending on the results of the CT scan.  ____________________________ Jill Side, MD si:cms D: 08/22/2012 09:03:07 ET T: 08/22/2012 10:03:54 ET JOB#: [id number]  cc: Jill Side, MD, <Dictator> Jill Side MD ELECTRONICALLY SIGNED 08/29/2012 11:37

## 2014-12-30 NOTE — H&P (Signed)
PATIENT NAME:  Tracy Jennings, Tracy Jennings MR#:  [id number] DATE OF BIRTH:  [birth year]  DATE OF ADMISSION:  08/20/2012  PRIMARY CARE PHYSICIAN: Casilda Carls, MD     CODE STATUS: FULL CODE. Surrogate decision maker is her husband, Fernanda Lamm.   CHIEF COMPLAINT: Abdominal pain.   HISTORY OF PRESENT ILLNESS: The patient is a 59 year old female with history of recurrent pancreatitis, status post cholecystectomy in February 2013, presents with abdominal pain, nausea and vomiting. The patient notes that she developed these symptoms about a week and a half ago characterized by progressive but gradually increasing intensity, epigastric abdominal pain that was 6 out of 7 in intensity, waxing and waning in nature, nonradiating. She started becoming significantly nauseated about a week ago and actually would induce emesis because she said that she could not tolerate the constant feeling of being nauseated. She actually, as a result of this over this period of time, placed herself on a sort of clear liquid diet, taking only liquids and popsicles; however, her symptoms persisted so she presented to the Emergency Department.  The patient does report constipation; however, last bowel movement was one day prior to presentation.  She cannot recall any new medications. She does note that around that time she did receive a new batch of insulin from her mail order pharmacy. Otherwise, she cannot identify any inciting factors.   This patient has a history of recurrent pancreatitis and actually had a cholecystectomy in February of 2013, at which time it was presumed that her pancreatitis was due to cholelithiasis. She denies being on any antibiotics, any steroids. Of note, during this evaluation she developed hyperglycemia with blood sugar of 399.   Regarding her diabetes mellitus, she notes that her blood glucose is usually uncontrolled with blood sugars ranging between 200 to 250. Her last A1c, she says, was between nine and  10. She is on insulin.   REVIEW OF SYSTEMS: CONSTITUTIONAL: She denies fevers, chills, weight loss, weight gain, night sweats. She denies chest pain, shortness of breath. HEENT: She had cataract surgery recently but no blurred or double vision. She denies tinnitus, ear pain, seasonal allergies. RESPIRATORY: She denies cough, wheezing, dyspnea. CARDIOVASCULAR: She denies chest pain, orthopnea, edema, or palpitations. GASTROINTESTINAL: She admits to nausea and vomiting as described as well as constipation. She also admits to epigastric abdominal pain. She denies melena or hematochezia. GENITOURINARY: She denies dysuria, frequency. INTEGUMENTARY:  She denies any skin rashes. MUSCULOSKELETAL: She denies any muscle pains, arthralgias or joint swelling. NEUROLOGIC: She denies paresthesias, weakness or tremor. PSYCHIATRIC: She denies history of anxiety or depression.   PAST MEDICAL HISTORY: Notable for: 1. Recurrent pancreatitis.  2. Hypertension.  3. Diabetes mellitus.   PAST SURGICAL HISTORY:  1. Cholecystectomy in February 2013.  2. Tubal ligation.  3. Cataract surgery.   ALLERGIES: Sulfa.   MEDICATIONS:  1. Losartan 50 mg daily.  2. NovoLog 6 units with breakfast, lunch, and dinner.  3. Lantus 16 units at bedtime.  4. She does not take aspirin routinely.   FAMILY HISTORY: Notable for hyperglycemia, hypertension, and cerebrovascular accident.   SOCIAL HISTORY: She denies alcohol, tobacco or illicit drug use. She is married. She works in [employer]. She lives with her husband. She has children that are grown.   PHYSICAL EXAMINATION:  VITALS: Temperature 98.1, pulse 105, respirations 20, blood pressure 156/83, sating 99% on room air.   GENERAL: African American female in mild distress due to pain.   PSYCHIATRIC: Awake, alert, oriented x3. Judgment and insight  are intact.   HEENT: Eyes: Pupils are equally round and reactive to light and accommodation. Anicteric sclerae. Pink conjunctivae. ENT:  Normal external ears and nares. Posterior oropharynx is clear. Mucosa is dry.   CARDIOVASCULAR: S1, S2, tachycardic. No pretibial edema. Pulses are equal bilaterally.   RESPIRATORY: Clear to auscultation bilaterally. No wheezes, rales, or rhonchi appreciated. Normal effort.   ABDOMEN: She has marked epigastric tenderness and mild palpation. Otherwise, abdomen is benign. She also has some tenderness on palpation of the right upper quadrant. No rebound. No guarding. No hepatomegaly appreciated.   MUSCULOSKELETAL: Full range of motion of all extremities. No clubbing, cyanosis, or edema noted.   SKIN: Warm and dry. No rashes appreciated.   LABORATORY, DIAGNOSTIC AND RADIOLOGICAL DATA: Urinalysis was done which shows 1+ blood, otherwise negative. BMP shows glucose 399, BUN 12, creatinine 0.9, sodium 135, potassium 4, chloride 101, bicarbonate 28, calcium 9.6, bilirubin 0.2, alkaline phosphatase 61, ALT 24, AST 11, total protein 7.8, albumin 3.3, osmolality of 287 with an anion gap of 6.0. WBC count 6.8, hemoglobin 13.6, hematocrit 41.6, platelet count 293 with an MCV of 90. Lipase is 1106.   ASSESSMENT AND PLAN: A 59 year old female with a history of recurrent pancreatitis presenting with abdominal pain, nausea, emesis, found to have elevated lipase and hyperglycemia consistent with acute pancreatitis, may be acute on chronic pancreatitis.   1. Pancreatitis: At this point, we will check an abdominal ultrasound to evaluate the pancreas itself for inflammation as the patient is status post cholecystectomy. Studies performed after her cholecystectomy showed that she did not have any stones in the common bile duct, so I doubt she has a retained stone at this point; but we will go ahead and reassess. She will be placed n.p.o. We will manage with antiemetics and pain medicines as needed. I suspect that overall the etiology of her pancreatitis is idiopathic. The patient does take losartan which is a class 1B   medication to induce pancreatitis, so we will discontinue this medication. Given her diabetes she needs ARB, we will choose an appropriate ARB that does not cause pancreatitis.   2. Hypertension: Hypertension is well controlled. IV metoprolol with prn IV hydralazine while NPO. She has been on lisinopril in the past which caused lip swelling so would avoid ACE inhibitors. Diovan was discontinued on her last admission for concern of pancreatitis. Consider irbersartan as ARB although it may still cause pancreatitis, may have lower incidences.  3. Diabetes mellitus. Continue NovoLog with Lantus. 4. Deep vein thrombosis prophylaxis: Lovenox.   CODE STATUS: FULL CODE.   The patient is being admitted to inpatient status as I anticipate that she will require at least two hospital night stays for evaluation and management.  ____________________________ Samson Frederic, DO aeo:cbb D: 08/20/2012 22:25:16 ET T: 08/21/2012 07:02:49 ET JOB#: [id number]  cc: Samson Frederic, DO, <Dictator> Isla Pence, MD Aretha Levi E Desarie Feild DO ELECTRONICALLY SIGNED 08/23/2012 1:18

## 2014-12-30 NOTE — Consult Note (Signed)
Brief Consult Note: Diagnosis: Pancreatitis.   Patient was seen by consultant.   Comments: Recurrent pancreatitis, ? etiology. S/P Cholecystectomy.  Recommendations: Clear liquid diet. May advance to full liquid by tomorrow as she seems to be doing better. Will review records and make further recommendations.  Electronic Signatures: Jill Side (MD)  (Signed 10-Dec-13 17:42)  Authored: Brief Consult Note   Last Updated: 10-Dec-13 17:42 by Jill Side (MD)

## 2015-01-02 NOTE — Discharge Summary (Signed)
PATIENT NAME:  Tracy Jennings, Tracy Jennings MR#:  [id number] DATE OF BIRTH:  Jun 09, 1956  DATE OF ADMISSION:  08/20/2012 DATE OF DISCHARGE:  08/22/2012  PRIMARY CARE PHYSICIAN:  Dr. Rosario Jacks GI PHYSICIAN:  Dr. Dionne Milo   PRESENTING COMPLAINT: Abdominal pain.   DISCHARGE DIAGNOSES:  1. Acute recurrent mild pancreatitis, etiology unclear.  2. Hypertension.  3. Type 2 diabetes.   CODE STATUS: FULL CODE.   CONDITION ON DISCHARGE: Fair.   MEDICATIONS: 1. Losartan 50 mg p.o. daily.  2. Lantus 16 units b.i.d.  3. NovoLog 6 units 3 times a day with meals.   DIET: Low sodium, low fat and cholesterol, carbohydrate controlled diet.   FOLLOWUP:  1. Follow up with Dr. Rosario Jacks in 1 to 2 weeks.  2. Follow up with Dr. Dionne Milo on 12/20 at 9:00 a.m.   LABS AT DISCHARGE: . Lipase is 517. CT of the abdomen shows pancreatitis, minimal thickening of the rectosigmoid wall. This may be from lack of distention. However, mild changes of colitis cannot be excluded. Tiny pleural effusion, mild basilar atelectasis. Troponin 0.02. CBC within normal limits. Glucose 224, BUN 8, creatinine 0.5, sodium 141, potassium 3.8, chloride 110, bicarbonate 25. Urinalysis negative for urinary tract infection. Lipase on admission was 1106.   BRIEF SUMMARY OF HOSPITAL COURSE:  1. Tracy Jennings is a 59 year old African American female with history of pancreatitis in the past, underwent cholecystectomy, came in with abdominal pain, nausea, and emesis, found to have elevated lipase. She was admitted with acute recurrent pancreatitis. The etiology remains unclear. She had normal triglycerides, mildly elevated lipase. IV fluids were continued. She was kept n.p.o., however, started on clear liquid and advanced to soft diet prior to discharge. Gastrointestinal input per Dr. Dionne Milo noted, recommended CT of the abdomen which showed mild pancreatitis. Since the patient was symptomatically better would discharge her. She will follow up closely with Dr.  Dionne Milo as outpatient.  2. Hypertension. Losartan was continued.  3. Type 2 diabetes. Her Lantus and NovoLog were continued.  4. Deep vein thrombosis prophylaxis with Lovenox.  5. Hospital stay otherwise remained stable.  6. CODE STATUS: The patient remained a FULL CODE.   TIME SPENT: 40 minutes.    ____________________________ Hart Rochester Posey Pronto, MD sap:bjt D: 08/24/2012 13:43:05 ET T: 08/24/2012 13:51:40 ET JOB#: [id number]  cc: Oree Hislop A. Posey Pronto, MD, <Dictator> Jill Side, MD Isla Pence, MD Ilda Basset MD ELECTRONICALLY SIGNED 09/12/2012 10:33

## 2015-01-03 NOTE — H&P (Signed)
PATIENT NAME:  Tracy Jennings, Tracy Jennings MR#:  [id number] DATE OF BIRTH:  [birth year]  DATE OF ADMISSION:  06/12/2014  PRIMARY CARE PHYSICIAN:      REFERRING PHYSICIAN:  Samantha A. Bullard, PA-C    CHIEF COMPLAINT: Bleeding from the right second toe.    HISTORY OF PRESENT ILLNESS: Ms. Parreira is a 59 year old female with a known history of diabetes mellitus and hypertension comes to the Emergency Department with right second toe increased swelling for the last 1 month.  Today noticed to have some bloody discharge.  The patient states had similar problem about 2 years back when her podiatrist gave her some powder, which the patient has been applying without much improvement.  Concerning about the bleeding, the patient came to the Emergency Department.  The patient also tells that has been nauseated for the last 2 days with decreased p.o. intake.  The patient denies any fever.  Workup in the Emergency Department, patient has fever of 99.4.  No elevated white blood cell count.  X-ray of the foot shows osteomyelitis involving the second distal tuft with soft tissue gas, the superficial soft tissues of the distal aspect of the second toe associated with a diffuse soft tissue swelling.  Concerning this discussed with the orthopedic surgeon who recommended patient to be admitted and consult podiatry.    PAST MEDICAL HISTORY:   1.  Hypertension.   2.  Diabetes mellitus.   3.  Recurrent episodes of pancreatitis.    PAST SURGICAL HISTORY:  Cholecystectomy.    ALLERGIES:  SULFA.    HOME MEDICATIONS:   1.  NovoLog 6 units 3 times a day.   2.  Losartan 50 mg once a day.   3.  Lantus 22 units 2 times a day.    SOCIAL HISTORY:   No history of smoking, drinking, alcohol, or using illicit drugs.  Married.  Lives with her husband.    FAMILY HISTORY:   Diabetes mellitus.    REVIEW OF SYSTEMS   CONSTITUTIONAL:  Denies any generalized weakness, has no any change in vision.   EARS, NOSE, AND THROAT:  No change in  hearing.   RESPIRATORY:   No cough, shortness of breath.   CARDIOVASCULAR:  No chest pain, palpitations.   GASTROINTESTINAL:  Has mild nausea.  No vomiting.   GENITOURINARY:  No dysuria or hematuria.   HEMATOLOGIC:  No easy bruising or bleeding.   SKIN:   Has right second toe significant swelling.   NEUROLOGICAL:  No weakness or numbness in any part of the body.    PHYSICAL EXAMINATION:   GENERAL:  This is a well-built, well-nourished female laying down in the bed not in distress.   VITAL SIGNS:   Temperature 99.8.  Pulse 95.  Blood pressure 140/80.  Respiratory rate of 18.  Oxygen saturations 97% on room air.   HEENT:  Normocephalic.  Atraumatic.  There is no scleral icterus.  Conjunctivae:  Normal.  Pupils are equal and react to light.  Mucous membranes are moist.  No pharyngeal erythema.   NECK:  Supple.  No lymphadenopathy.  No JVD.  No carotid bruit.   CHEST:  Has no focal tenderness.   LUNGS:  Bilaterally clear to auscultation.   HEART:   S1, S2 regular.  No murmurs are heard.   ABDOMEN:  Bowel sounds present.  Soft.  Nontender.  Nondistended.  No hepatosplenomegaly.   EXTREMITIES:  Right second toe shows significant swelling and necrotic tissue at the plantar aspect.  Pulses  2+.   MUSCULOSKELETAL:  Good range of motion in all the extremities.   NEUROLOGICAL:  The patient is alert, oriented to place, person and time.  Cranial nerves II through XII intact.  Motor 5/5 in upper and lower extremities.    LABORATORY DATA:  CBC is completely within normal limits.  CMP except for a blood glucose ,  are within normal limits.    ASSESSMENT AND PLAN:  Ms. Rehling, a 59 year old female, comes with second toe osteomyelitis.   1.  Osteomyelitis.  Consult podiatry in the morning.  The patient , does not look septic.  We will hold the antibiotics.  We will obtain ABI.   2.  Diabetes mellitus, insulin dependent.  Continue the home dose.  We will obtain hemoglobin A1c.   3.  Hypertension.  Currently  well controlled.  Continue the losartan.   4.  Keep the patient on deep venous thrombosis prophylaxis with Lovenox.    TIME SPENT:  50 minutes.     ____________________________ Monica Becton, MD pv:AT D: 06/12/2014 23:16:05 ET T: 06/12/2014 23:57:42 ET JOB#: [id number]  cc: Monica Becton, MD, <Dictator> Unknown cc Monica Becton MD ELECTRONICALLY SIGNED 06/13/2014 23:00

## 2015-01-03 NOTE — Op Note (Signed)
PATIENT NAME:  Tracy Jennings, Tracy Jennings MR#:  [id number] DATE OF BIRTH:  [birth year]  DATE OF PROCEDURE:  06/13/2014  PREOPERATIVE DIAGNOSIS: Acute osteomyelitis.   POSTOPERATIVE DIAGNOSIS: Acute osteomyelitis.   PROCEDURE: Partial amputation, left 2nd toe.   ESTIMATED BLOOD LOSS: Minimal.   ANESTHESIA: IV sedation with local.   HEMOSTASIS: None.   COMPLICATIONS: None.   SPECIMEN: Osteomyelitis, left 2nd toe.   OPERATIVE INDICATIONS: This is a 59 year old female who was admitted to the hospital with a bleeding, draining left 2nd toe. Upon evaluation she was found to have obvious osteomyelitis of her left 2nd toe with erosive changes on x-ray and purulent drainage. A wound culture was taken and after discussion with the patient consent for partial amputation of her left 2nd toe was given.   OPERATIVE PROCEDURE: The patient was brought into the OR and placed on the operating table in the supine position. IV sedation was administered by the anesthesia team. A local block was placed around the left 2nd toe. The foot was then prepped and draped sterilely. Attention was directed to the PIPJ where a fishmouth incision was made. Full thickness incision was taken down to the joint and the toe was disarticulated at the joint. At this time, further excision of the head of the proximal phalanx was undertaken for final closure of the toe. The wound was then flushed with copious amounts of irrigation. No further infected soft tissue or bone was noted grossly at this time. The wound was then closed with a 3-0 nylon for skin. She was placed in a well compressive sterile dressing. She will be readmitted to the floor. We will see her in the outpatient clinic in 5-7 days.     ____________________________ Pete Glatter Vickki Muff, DPM jaf:lt D: 06/13/2014 17:29:12 ET T: 06/14/2014 06:57:42 ET JOB#: [id number]  cc: Larkin Ina A. Vickki Muff, DPM, <Dictator> Skylee Baird DPM ELECTRONICALLY SIGNED 06/26/2014 12:31

## 2015-01-03 NOTE — Consult Note (Signed)
PATIENT NAME:  Tracy Jennings, Tracy Jennings MR#:  [id number] DATE OF BIRTH:  [birth year]  DATE OF CONSULTATION:  06/13/2014  REFERRING PHYSICIAN:   CONSULTING PHYSICIAN:  Ibrohim Simmers A. Vickki Muff, DPM  REASON FOR CONSULTATION: Left foot second toe infection.   HISTORY OF PRESENT ILLNESS: This is a 59 year old female who was admitted through the ER with a draining left second toe. She has a longstanding history of diabetes. She states the second toe has been swollen for several days and started bleeding and draining within the last couple of days. She does not see a podiatrist on a regular basis. Her primary care physician is Dr. Rosario Jacks. She denies any fever or chills of recent.   PAST MEDICAL HISTORY: Hypertension, diabetes, history of pancreatitis.   HOME MEDICATIONS: NovoLog, losartan, Lantus.    ALLERGIES: SULFA.     REVIEW OF SYSTEMS: Per HPI. No fever or chills. No shortness of breath or chest pains. No pain to her left lower extremity. She does have numbness to the lower extremities bilaterally.   PHYSICAL EXAMINATION:  GENERAL: She is alert and oriented.  VASCULAR: She has a palpable DP and PT pulse to both her left and right feet.    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  DERMATOLOGY: On her left second toe at the distal aspect there is an open ulceration with draining purulence that probes directly to the distal phalanx of the second toe. There is diffuse edema to the second toe to the PIPJ, but not proximal to this area. There is no proximal lymphangitic streaking or severe erythema noted. No ulcerative site seen anywhere on the left or right foot.  MUSCULOSKELETAL: Diffuse edema to the left second toe and mild edema to the left foot. She has mild hammertoe contracture of her left second toe causing a pressure-induced lesion.   LABORATORY DATA:  X-rays reviewed of her left foot show erosive changes on the distal phalanx of the left second toe. No gas in the soft tissues  at this time. Laboratory results show hemoglobin A1c at 11.2, her recent CBC was 8.4. Blood cultures are negative.   ASSESSMENT: Osteomyelitis left second toe.   PLAN: We had a long discussion concerning different treatment options. We discussed continued conservative treatment versus surgical intervention for removal of the infected bone. I discussed the risks and benefits associated with conservative treatment and surgery. She has elected to undergo surgical amputation of the left second toe. Consent will be given, but verbal informed consent was given today. We will plan on performing this this afternoon. She is n.p.o. now and has not eaten since 8:30 this morning. We will plan on surgery at 4:30 this afternoon.    ____________________________ Pete Glatter Vickki Muff, DPM jaf:bu D: 06/13/2014 13:28:00 ET T: 06/13/2014 13:57:59 ET JOB#: [id number]  cc: Larkin Ina A. Vickki Muff, DPM, <Dictator> Maury Bamba DPM ELECTRONICALLY SIGNED 06/26/2014 12:31

## 2015-01-03 NOTE — Consult Note (Signed)
Brief Consult Note: Diagnosis: Osteomyelits left 2nd toe.   Patient was seen by consultant.   Comments: Full note to follow. Osteomyelitis left 2nd toe confirmed on xray. D/W pt amputation 2nd toe.  Will make npo for now.  Will return to d/w pt and make final decision. Wound culture taken.  Electronic Signatures: Samara Deist (MD)  (Signed 02-Oct-15 10:29)  Authored: Brief Consult Note   Last Updated: 02-Oct-15 10:29 by Samara Deist (MD)

## 2015-01-03 NOTE — H&P (Signed)
PATIENT NAME:  Tracy Jennings, Tracy Jennings MR#:  [id number] DATE OF BIRTH:  [birth year]  DATE OF ADMISSION:  12/09/2013  REASON FOR ADMISSION: Acute pancreatitis on chronic pancreatitis.   REFERRING PHYSICIAN: Dr. Jasmine December.   PRIMARY CARE PHYSICIAN: Dr. Rosario Jacks.   HISTORY OF PRESENT ILLNESS: This is a very nice, 59 year old female with history of multiple episodes of pancreatitis in her life. She also has a history of hypertension, diabetes. Her first pancreatitis episode happened 10 years ago and, since then, she has been having it multiple times a year. The patient started with abdominal pain last Saturday in the morning. She decided to stay home and keep herself n.p.o. with only sips of fluids, but the pain did not relieve in intensity. The pain was 6 out of 10, located in the epigastric area without any radiation of the pain. The patient has waxing and waning pain. It never completely went away, just changed in intensity. Denies any fever or chills. The patient has been significantly constipated and feels a little bit distended. She denies any vomiting, but she has been feeling nauseated. The pain is similar to many other episodes of pancreatitis. No alleviating factors. No worsening factors, just goes up and down in intensity by itself. The patient has not been taking any pain medications other than over-the-counter Tylenol when needed. The patient is admitted for treatment of pancreatitis.   REVIEW OF SYSTEMS: A 12-system review of systems done. CONSTITUTIONAL: No fever, fatigue or weakness.  EYES: No blurry vision, double vision.  EARS, NOSE, THROAT: No difficulty swallowing or tinnitus.  RESPIRATORY: No cough, wheezing, or hemoptysis.  CARDIOVASCULAR: No chest pain, orthopnea, or arrhythmia.  GASTROINTESTINAL: No nausea, vomiting, or hematemesis.  GENITOURINARY: No dysuria, hematuria, changes in frequency.  GYNECOLOGIC: No breast mases ENDOCRINE: No polyuria, polydipsia, polyphagia, cold or heat  intolerance. She is diabetic and it has been overall well controlled.  HEMATOLOGIC AND LYMPHATIC: No anemia, easy bruising or bleeding.  MUSCULOSKELETAL: No significant neck pain, back pain or hip pain.  NEUROLOGIC: No numbness, tingling, or ataxia.  PSYCHIATRIC: No insomnia, depression, or anxiety.   PAST MEDICAL HISTORY: 1.  Recurrent pancreatitis.  2.  Hypertension.  3.  Diabetes.    PAST SURGICAL HISTORY: 1.  Cholecystectomy in February 2013. 2.  Tubal ligation.  3.  Cataract surgery.   ALLERGIES: SULFA DRUGS.   MEDICATIONS: The patient takes NovoLog 6 units at breakfast, lunch, and dinner; Lantus 16 units at bedtime and occasionally increased depending on her blood sugars; losartan 50 mg once daily.   FAMILY HISTORY: Negative for myocardial infarction. Negative for CVA. Her sisters have diabetes. No significant history of hypertension or cancer.   SOCIAL HISTORY: The patient denies any alcohol use. No tobacco use or other drugs. She is married and she continues to work at [employer] full-time. She has children that have left the house right now. She lives at home only with her husband.   PHYSICAL EXAMINATION: VITAL SIGNS: Blood pressure 127/78, pulse 92, respirations 18, temperature 98.6, oxygen saturation 98% on room air.  GENERAL: The patient is alert, oriented x3, in no acute distress. No respiratory distress. Hemodynamically stable.  HEENT: Pupils are equal and reactive. Extraocular movements are intact. Normocephalic, atraumatic. Mucosas are dry. Anicteric sclerae. Pink conjunctivae. No oral lesions. No oropharyngeal exudates.  NECK: Supple. No JVD. No thyromegaly. No adenopathy. No carotid bruits.  CARDIOVASCULAR: Regular rate and rhythm. No murmurs, rubs or gallops are appreciated.  LUNGS: Clear. No displacement of PMI. Lungs are clear without  any wheezing or crepitus. No use of accessory muscles.  ABDOMEN: Slightly tender at the level of the epigastric area, slightly  distended and tympanic. The patient is passing gas. No rebound tenderness. No significant guarding. Again, only mild tenderness to deep palpation at the level of the epigastric area. No organomegaly.  GENITAL: Deferred.  EXTREMITIES: No edema, cyanosis or clubbing. Pulses +2. Capillary refill less than 3.  SKIN: No rashes, petechiae, or new lesions.  NEUROLOGIC: Cranial nerves II through XII intact. No focal findings.  PSYCHIATRIC: No significant agitation. The patient is alert, oriented x3.  MUSCULOSKELETAL: No joint effusions or joint swelling.  LYMPHATIC: Negative for lymphadenopathy in neck or supraclavicular areas.   DIAGNOSTIC DATA: Glucose 231, after IV fluids 156, creatinine 0.79, sodium 135. Lipase 375, total protein 8, albumin 3.3. LFTs overall within normal limits. Bilirubin 0.5. Troponin 0.02. White count 6.4, hemoglobin 14, platelet count 227. Urinalysis has 5 red blood cells, 3 white blood cells, negative nitrites or leukocyte esterase. KUB: Diffuse stool throughout the colon, bowel gas pattern overall unremarkable, consistent with constipation   ASSESSMENT AND PLAN: A 59 year old female with history of recurrent pancreatitis, hypertension, and diabetes comes in with new onset abdominal pain for four to five days.   1.  Acute pancreatitis. The patient has acute on chronic pancreatitis. She does not have any treatments with Creon or replacement for pancreatic enzymes. The patient is going to be on nothing by mouth. She is going to be put on IV fluids with isotonic saline solution at 100 mL an hour for now. Monitor lipase in the morning. I believe that most of the reason for this exacerbation could be secondary to significant constipation. The patient is showing significant amounts of stool scattered throughout the whole intestine. The patient is going to be put on stool softeners, MiraLax, and a suppository is going to be given today. Monitor her lipase in the morning. Pain control and  nausea control with IV medications. Since the patient has significant diabetes and she is going to be on nothing by mouth, we are going to put her only on insulin sliding scale. Hold her NovoLog and Levemir. The patient is going to be treated for blood sugars above 200 only, as she is on nothing by mouth, at high risk of hypoglycemia. As far as her high blood pressure, her blood pressure has been well controlled. Continue as-needed medications like hydralazine, hold losartan for now.  2.  Deep vein thrombosis prophylaxis with Lovenox.  3.  Gastrointestinal prophylaxis with Protonix.   CODE STATUS: The patient is a full code.   TIME SPENT: I spent about 45 minutes with this admission.    ____________________________ Gallatin Sink, MD rsg:cg D: 12/09/2013 22:15:01 ET T: 12/09/2013 23:40:47 ET JOB#: [id number]  cc: McDougal Sink, MD, <Dictator> Shaena Parkerson America Brown MD ELECTRONICALLY SIGNED 12/17/2013 21:12

## 2015-01-03 NOTE — Discharge Summary (Signed)
PATIENT NAME:  Tracy Jennings, Tracy Jennings MR#:  [id number] DATE OF BIRTH:  07-Jul-1956  DATE OF ADMISSION:  12/09/2013 DATE OF DISCHARGE:  12/11/2013  PRIMARY CARE PHYSICIAN: Dr. Rosario Jacks.   CHIEF COMPLAINT: Abdominal pain.   DISCHARGE DIAGNOSES:  1.  Acute and recurrent pancreatitis.  2.  Constipation.  3.  Hypertension.  4.  Diabetes.   DISCHARGE MEDICATIONS: Losartan 50 mg once a day, NovoLog Flex pen 6 units 3 times a day with meals and Lantus 22 units 2 times a day.   DIET: Low-sodium, low-fat, low-cholesterol, ADA diet. GI soft for a week then regular consistency.   ACTIVITY: As tolerated.   FOLLOWUP: Please follow with PCP within 1 to 2 weeks. Please follow with GI as scheduled for you with Dr. Vira Agar on May 04 at 8:00.   DISPOSITION: Home.   SIGNIFICANT LABS AND IMAGING: Initial lipase 875, last lipase of 535. Triglycerides of  104. Initial BUN 11, creatinine 0.79 and sodium 135. LFTs on arrival with albumin of 3.3, AST 13, otherwise within normal limits. Initial troponin negative. Initial white count 6.4, hemoglobin 14.2, platelets 277.   HISTORY OF PRESENT ILLNESS AND HOSPITAL COURSE: For full details of H and P, please see the dictation on March 30 by Dr. Laurin Coder, but briefly this is a pleasant 59 year old with history of recurrent and multiple pancreatitis in the past, hypertension, diabetes, who came in with abdominal pain the Saturday before arrival. She stayed home, tried to keep herself n.p.o. with only sips of fluids, but pain persisted and she came into the hospital where she was found to have elevated lipase. She had no alleviating or worsening factors. She was admitted to the hospitalist service with IV fluids, pain medications and initially made n.p.o. She was also noted to have significant constipation based on abdominal films. She was started on aggressive bowel regiment and her lipase came down. The constipation resolved. She already is status post cholecystectomy. She, at this  point, has no significant pain issues and although the lipase is mildly elevated, it is down without significant symptoms. At this point, we have a made an appointment with GI as an outpatient and the patient is very eager to be discharged. As she has tolerated advanced diet, she will be discharged to outpatient followup.   TOTAL TIME SPENT: 35 minutes.   CODE STATUS: The patient is full code.  ____________________________ Vivien Presto, MD sa:aw D: 12/12/2013 08:12:50 ET T: 12/12/2013 08:25:26 ET JOB#: [id number]  cc: Vivien Presto, MD, <Dictator> Isla Pence, MD Vivien Presto MD ELECTRONICALLY SIGNED 12/25/2013 10:16

## 2015-01-03 NOTE — Discharge Summary (Signed)
PATIENT NAME:  Tracy Jennings, Tracy Jennings MR#:  [id number] DATE OF BIRTH:  07-28-1956  DATE OF ADMISSION:  06/12/2014 DATE OF DISCHARGE:  06/16/2014  DISCHARGE DIAGNOSES: 1. Right second toe osteomyelitis with partial amputation.  2. Poorly controlled type 2 diabetes mellitus.  3. Essential hypertension. 4. Type 2 diabetes mellitus with complications.  5. Hypertension.    MEDICATIONS:  1. Lantus 22 units twice a day.  2. Losartan 50 mg p.o. daily.  3. Insulin subcutaneous 5 units t.i.d. before meals.  4. PICC line care as per protocol.  5. Cefazolin 1 gram IV q.8 h.  6. Metoprolol 25 mg 1 tablet p.o. b.i.d.   DIET: Low sodium diet,  We will get CBC and BMP every week while on antibiotics.   CONSULTATIONS: Podiatric consult, Dr. Samara Deist.   HOSPITAL COURSE: A 58 year old female patient with history of type 2 diabetes mellitus, who comes in because of left foot, second toe infection. The patient has a draining left second toe. The patient has a long-standing history of diabetes. Has a swollen left foot with drainage. The patient admitted for the second toe foot infection due to diabetes xray showed osteomyelitis involving the second distal tuft with soft tissue gas in the superficial soft tissues of the distal aspect of the second toe and associated diffuse soft tissue swelling.  1. The patient has osteomyelitis of the left second toe and she was admitted to the medical service, started on antibiotics, seen by Dr. Vickki Muff, and she had amputation of the left second toe, which is a partial amputation. She tolerated the procedure well, and she does have dressings changed by Dr. Vickki Muff, and she has a postoperative boot to the left leg. Cultures from the left foot showed MSSA. The patient is on cefazolin for 2 weeks and Dr. Ola Spurr will see her in the clinic. This week Dr. Ola Spurr is off, so we requested antibiotic selection form to be faxed to his office and we will call him next week, and  then see if he can see her in the follow-up in the office. The patient already has a toe amputation, so I believe these antibiotics for 2 weeks is fine. We arranged advanced home care to go home and give antibiotics and dressing changes for the patient.  2. Type 2 diabetes mellitus, which is uncontrolled. She has hemoglobin A1c of 11, and she says that she works nights and it is very difficult for her to control eating and also checking her sugars. Hemoglobin A1c was 11.2. The patient is on Lantus and sliding scale as dictated in my discharge summary. Advised her to be compliant with medications.  3. Hypertension. The patient's blood pressure is stable. She has blood pressure of 156/92 today. Temperature 98.1, heart rate 92, saturation 98%, so she can continue her losartan and also metoprolol.  4. She was also seen by inpatient diabetic nurse and given education about checking her sugars and follow up with PCP regarding hemoglobin A1c and diabetic management.   TIME SPENT ON DISCHARGE PREPARATION: More than 30 minutes.    ____________________________ Epifanio Lesches, MD sk:JT D: 06/16/2014 12:33:08 ET T: 06/16/2014 14:12:22 ET JOB#: [id number]  cc: Epifanio Lesches, MD, <Dictator> Justin A. Vickki Muff, DPM (Dictation Anomaly)   Epifanio Lesches MD ELECTRONICALLY SIGNED 07/05/2014 18:05

## 2015-01-04 NOTE — Consult Note (Signed)
Brief Consult Note: Diagnosis: acute pancreatitis due to gall stones.   Patient was seen by consultant.   Consult note dictated.   Recommend further assessment or treatment.   Orders entered.   Discussed with Attending MD.   Comments: This patient has acute pancreatitis due to gall stones .This is her 3rd attack and ultrasound showed sludge and stones.will need cholysystectomy after the amylase lipase come down thanks for referal.  Electronic Signatures: Vella Kohler (MD)  (Signed 17-Feb-13 22:54)  Authored: Brief Consult Note   Last Updated: 17-Feb-13 22:54 by Vella Kohler (MD)

## 2015-01-04 NOTE — Consult Note (Signed)
Chief Complaint:   Subjective/Chief Complaint feeling better   VITAL SIGNS/ANCILLARY NOTES: **Vital Signs.:   18-Feb-13 05:45   Vital Signs Type Routine   Temperature Temperature (F) 98.8   Celsius 37.1   Temperature Source oral   Pulse Pulse 90   Pulse source per Dinamap   Respirations Respirations 20   Systolic BP Systolic BP [id number]   Diastolic BP (mmHg) Diastolic BP (mmHg) 83   Mean BP 107   BP Source Dinamap   Pulse Ox % Pulse Ox % 95   Pulse Ox Activity Level  At rest   Oxygen Delivery Room Air/ 21 %    07:22   Vital Signs Type POCT   Nurse Fingerstick (mg/dL) FSBS (fasting range 65-99 mg/dL) 156   Comments/Interventions  Nurse Notified    08:29   Vital Signs Type Routine   Temperature Temperature (F) 98.6   Celsius 37   Temperature Source oral   Pulse Pulse 89   Respirations Respirations 20   Systolic BP Systolic BP [id number]   Diastolic BP (mmHg) Diastolic BP (mmHg) 91   Mean BP 112   Pulse Ox % Pulse Ox % 99   Pulse Ox Activity Level  At rest   Oxygen Delivery Room Air/ 21 %    11:41   Nurse Fingerstick (mg/dL) FSBS (fasting range 65-99 mg/dL) 200   Comments/Interventions  Nurse Notified    13:47   Vital Signs Type Routine   Temperature Temperature (F) 98.2   Celsius 36.7   Temperature Source oral   Pulse Pulse 91   Pulse source per Dinamap   Respirations Respirations 20   Systolic BP Systolic BP [id number]   Diastolic BP (mmHg) Diastolic BP (mmHg) 86   Mean BP 105   Pulse Ox % Pulse Ox % 99   Pulse Ox Activity Level  At rest   Oxygen Delivery Room Air/ 21 %    16:16   Vital Signs Type POCT   Nurse Fingerstick (mg/dL) FSBS (fasting range 65-99 mg/dL) 197   Comments/Interventions  Nurse Notified  *Intake and Output.:   Daily 18-Feb-13 07:00   Grand Totals Intake:  90 Output:      Net:  90 24 Hr.:  90   Oral Intake      In:  90   Length of Stay Totals Intake:  90 Output:      Net:  90    08:00   Grand Totals Intake:  720 Output:      Net:  720 24 Hr.:   720   Oral Intake      In:  720    09:30   Grand Totals Intake:  3888 Output:      Net:  3888 24 Hr.:  [id number]   IV (Primary)      In:  [id number]    11:50   Grand Totals Intake:  760 Output:      Net:  760 24 Hr.:  5368   Oral Intake      In:  760    Shift 15:00   Grand Totals Intake:  5368 Output:      Net:  5368 24 Hr.:  5368   Oral Intake      In:  1480   IV (Primary)      In:  3888   Length of Stay Totals Intake:  5458 Output:      Net:  [id number]   Brief Assessment:   Cardiac Regular    Respiratory  normal resp effort    Gastrointestinal Normal    Gastrointestinal details normal Soft  Nontender  Nondistended  Bowel sounds normal  No rebound tenderness  No gaurding  No rigidity  No organomegaly    Additional Physical Exam pt improving.planing to do laprascopic cholysystectomy on wednesday.lipase coming down.   Assessment/Plan:  Assessment/Plan:   Assessment improving gall stone pancreatitis    Plan lap choly with cholangiogram on wednesday   Electronic Signatures: Vella Kohler (MD)  (Signed [phone] 16:37)  Authored: Chief Complaint, VITAL SIGNS/ANCILLARY NOTES, Brief Assessment, Assessment/Plan   Last Updated: 18-Feb-13 16:37 by Vella Kohler (MD)

## 2015-01-04 NOTE — Consult Note (Signed)
Chief Complaint:   Subjective/Chief Complaint Feels better. No abdominal pain. Lipase is better.  impression; Pancreatitis, clinically mich better.  Recommendations: Full liquid diet. Recommend cholecystectomy. Discussed with Dr. Phylis Bougie. Will sign off. Please call me if needed. Thanks.   VITAL SIGNS/ANCILLARY NOTES: **Vital Signs.:   18-Feb-13 13:47   Vital Signs Type Routine   Temperature Temperature (F) 98.2   Celsius 36.7   Temperature Source oral   Pulse Pulse 91   Pulse source per Dinamap   Respirations Respirations 20   Systolic BP Systolic BP [id number]   Diastolic BP (mmHg) Diastolic BP (mmHg) 86   Mean BP 105   Pulse Ox % Pulse Ox % 99   Pulse Ox Activity Level  At rest   Oxygen Delivery Room Air/ 21 %   Routine Chem:  18-Feb-13 04:59    Lipase 581  Blood Glucose:  18-Feb-13 07:21    POCT Blood Glucose 156    11:39    POCT Blood Glucose 200    16:16    POCT Blood Glucose 197   Electronic Signatures: Jill Side (MD)  (Signed 18-Feb-13 18:43)  Authored: Chief Complaint, VITAL SIGNS/ANCILLARY NOTES, Lab Results   Last Updated: 18-Feb-13 18:43 by Jill Side (MD)

## 2015-01-04 NOTE — Discharge Summary (Signed)
PATIENT NAME:  Tracy Jennings, Tracy Jennings MR#:  [id number] DATE OF BIRTH:  [birth year]  DATE OF ADMISSION:  10/29/2011 DATE OF DISCHARGE:  11/03/2011  CHIEF COMPLAINT: Abdominal pain, nausea, vomiting, pancreatitis.   CONSULTING PHYSICIANS:  1. Dr. Dionne Milo from GI  2. Dr. Phylis Bougie from Surgery    DISCHARGE DIAGNOSES:  1. Pancreatitis possibly in the setting of gallstones status post laparoscopic cholecystectomy. 2. Hypertension. 3. Diabetes. 4. History of pancreatitis in the past.   DISCHARGE MEDICATIONS:  1. Percocet 5/325 one tab every four hours as needed for pain.  2. Zofran 4 mg ODT 1 tab every four hours as needed for nausea and vomiting.  3. Insulin Lantus 16 units in the morning. 4. NovoLog 2 units with every meal. 5. Glyburide/metformin 5/500 mg 2 tabs p.o. b.i.d.  6. Amlodipine 10 mg p.o. daily.  DIET: Low salt, ADA diabetic diet.   FOLLOW-UP:  1. Please follow-up with your PCP within one week.  2. Please follow-up with Dr. Phylis Bougie on Wednesday.   DISCHARGE INSTRUCTIONS:  1. Please keep surgical site clean and dry.  2. May shower.   HISTORY OF PRESENT ILLNESS: Please see the full history and physical dictated on 10/29/2011 by Dr. Doy Hutching. Briefly, this is a 59 year old female with history of pancreatitis, hypertension, and diabetes who presented the second time in two days for similar symptoms of nausea, vomiting, abdominal pain. She had been found with pancreatitis and went home with conservative treatment, however, came back for persistent symptoms and intractable nausea and vomiting. She was admitted to the hospitalist service.  SIGNIFICANT LABS AND IMAGING: On the 15th, creatinine 0.77, sodium 139, potassium 3.8. Initial lipase on the 15th 1966, on the 16th 1162, on the 19th 369. Initial WBC 6, hemoglobin 14.2, platelets 304. Initial bilirubin 0.5, alkaline phosphatase 37, AST 14, ALT 23. Last LFTs on February 19th bilirubin less than 0.1, alkaline phosphatase 43, AST 18, ALT 21.  Urine culture mixed flora. Initial urinalysis on the 15th 100 protein, 6 WBCs, no nitrites or leukocyte esterase. Ultrasound of the abdomen on February 16th showing probable sludge and possible nonshadowing stones in the gallbladder. No evidence of acute cholecystitis or biliary ductal dilation. Cholangiogram normal operative cholangiogram. No retained stone in the common duct. No common duct dilation.   HOSPITAL COURSE: The patient was admitted to the hospitalist service. She was started on IV fluids, pain medications, and Zofran for nausea and vomiting. She was initially kept n.p.o. LFTs did not support acute gallstone pancreatitis as bilirubin was not elevated as well as alkaline phosphatase. However, passed stone is not excluded. Surgery was consulted. She was seen by Dr. Phylis Bougie and the patient successfully underwent laparoscopic cholecystectomy on 11/02/2011. She is to follow-up with Dr. Phylis Bougie as an outpatient. Postop she did well. She has been passing stool. She is tolerating her diet and has no abdominal pain. She is to follow-up with Dr. Phylis Bougie on Wednesday.   For high blood pressure, her lisinopril was discontinued as it could also cause pancreatitis although this is less likely. Currently she is on amlodipine 10 mg which she is tolerating well.   For her diabetes, she was placed on sliding scale and her long-acting Lantus was held. Upon discharge these can be resumed.   At this point given no abdominal pain, tolerating diet, she will be discharged with outpatient follow-up with her PCP and Dr. Phylis Bougie.   DISPOSITION: Home.   CODE STATUS: FULL CODE.     TOTAL TIME SPENT: 35 minutes.   ____________________________  Vivien Presto, MD sa:drc D: 11/03/2011 14:59:52 ET T: 11/04/2011 08:11:03 ET JOB#: [id number]  cc: Vivien Presto, MD, <Dictator> Isla Pence, MD Vivien Presto MD ELECTRONICALLY SIGNED 11/12/2011 8:56

## 2015-01-04 NOTE — H&P (Signed)
PATIENT NAME:  Tracy Jennings, Tracy Jennings MR#:  [id number] DATE OF BIRTH:  09-11-1956  DATE OF ADMISSION:  10/29/2011  REFERRING PHYSICIAN: Dr. Cinda Quest  FAMILY PHYSICIAN: Dr. Rosario Jacks   REASON FOR ADMISSION: Abdominal pain with intractable nausea and vomiting associated with pancreatitis.   HISTORY OF PRESENT ILLNESS: The patient is a 59 year old female who presents for the second time in two days to the Emergency Room with abdominal pain and intractable nausea and vomiting. She was found to have pancreatitis, presumably from diabetic medications according to Dr. Rosario Jacks. In the Emergency Room tonight, the patient was given medicine for pain and nausea but could not keep anything down. She continues to vomit and is now admitted for further evaluation.   PAST MEDICAL HISTORY:  1. Recent onset pancreatitis.  2. Benign hypertension.  3. Type 2 diabetes mellitus.   MEDICATIONS:  1. Zofran 4 mg p.o. every four hours p.r.n. nausea and vomiting.  2. Percocet 7.5/325, 1 p.o. every four hours p.r.n. pain.  3. NovoLog 2 units subcutaneous before meals. 4. Lantus 16 units subcutaneous at bedtime.  5. Zestril 20 mg p.o. daily.   ALLERGIES: Sulfa.   SOCIAL HISTORY: The patient denies alcohol or tobacco abuse.   FAMILY HISTORY:  Positive for diabetes, hypertension, and stroke.   REVIEW OF SYSTEMS: CONSTITUTIONAL: No fever or change in weight. EYES: No blurred or double vision. No glaucoma. ENT: No tinnitus or hearing loss. No nasal discharge or bleeding. No difficulty swallowing. RESPIRATORY: No cough or wheezing. Denies hemoptysis. No painful respiration. CARDIOVASCULAR: No chest pain or orthopnea. No palpitations or syncope. GI: No diarrhea. No change in bowel habits. GU: No dysuria or hematuria. No incontinence. ENDOCRINE: No polyuria or polydipsia. No heat or cold intolerance. HEMATOLOGIC: The patient denies anemia, easy bruising, or bleeding. LYMPHATIC: No swollen glands. MUSCULOSKELETAL: The patient has some  pain in her back but denies pain in her neck, shoulders, knees, or hips. No gout. NEUROLOGIC: No numbness or weakness. Denies migraines, stroke, or seizures. PSYCH: The patient denies anxiety, insomnia, or depression.   PHYSICAL EXAMINATION:  GENERAL: The patient is acutely ill-appearing, in moderate distress.   VITAL SIGNS: Vital signs are remarkable for a blood pressure of 131/69 with a heart rate of 95 and a respiratory rate of 16. She is afebrile.   HEENT: Normocephalic, atraumatic. Pupils equally round and reactive to light and accommodation. Extraocular movements are intact. Sclerae anicteric. Conjunctivae are clear.  Oropharynx is clear.   NECK: Supple without jugular venous distention or bruits. No adenopathy or thyromegaly is noted.   LUNGS: Clear to auscultation and percussion without wheezes, rales, or rhonchi. No dullness.   CARDIAC: Regular rate and rhythm. Normal S1, S2. No significant rubs, murmurs, or gallops. PMI is nondisplaced. Chest wall is nontender.   ABDOMEN: Soft but tender in the epigastrium and upper quadrants bilaterally. Some guarding but no rebound. Hypoactive bowel sounds. No organomegaly or masses were appreciated. No hernias or bruits were noted.   EXTREMITIES: Without clubbing, cyanosis, or edema. Pulses were 2+ bilaterally.   SKIN: Warm and dry without rash or lesions.   NEUROLOGIC: Cranial nerves II through XII grossly intact. Deep tendon reflexes were symmetric. Motor and sensory exam is nonfocal.   PSYCH: The patient was alert and oriented to person, place, and time. She was cooperative and used good judgment.   LABORATORY DATA: Urinalysis revealed 1+ bacteria with four WBCs per high-power field. Lipase was 1162. Glucose 161, with a BUN of 12 and a creatinine of 0.61,  with a sodium of 139 and a potassium of 4.5. Bilirubin was 0.6 with an ALT of 23 and an AST of 29. White count was 6.9 with a hemoglobin of 14.4. Ultrasound of the abdomen revealed sludge  in the gallbladder with no evidence of acute cholecystitis or biliary ductal dilatation.   ASSESSMENT:  1. Acute pancreatitis.  2. Intractable nausea and vomiting.  3. Generalized abdominal pain.  4. Type 2 diabetes mellitus. 5. Benign hypertension.   PLAN: The patient will be admitted to the floor and kept n.p.o. except for ice chips and medications. She will be hydrated with IV fluids. We will begin empiric IV antibiotics and send off a urine culture at this time. We will follow her labs closely including her lipase and her liver function test. GI consult in the morning. We will follow her sugars with Accu-Cheks before meals and at bedtime and add sliding scale insulin. We will hold her baseline insulin at this time while she is n.p.o. Further treatment and evaluation will depend upon the patient's progress.   TOTAL TIME SPENT ON ADMISSION: 50 minutes.    ____________________________ Tracy Douglas Doy Hutching, MD jds:bjt D: 10/29/2011 21:40:36 ET T: 10/30/2011 07:57:53 ET JOB#: [id number]  cc: Tracy Douglas. Doy Hutching, MD, <Dictator> Isla Pence, MD JEFFREY Lennice Sites MD ELECTRONICALLY SIGNED 10/30/2011 10:27

## 2015-01-04 NOTE — Consult Note (Signed)
Brief Consult Note: Diagnosis: Pancreatitis.   Patient was seen by consultant.   Consult note dictated.   Orders entered.   Comments: Recurrent pancreatitis. Etiology is not clear. Patient denies ETOH. ? Sludge or gallstones noted on [place] although LFT's are normal. Total protein is high which can be seen in patients with autoimmune pancreatitis although it is unlikely.   Continue NPO with clear liquids when better. Check lipid profile. Will obtain surgical consultation.  Electronic Signatures: Jill Side (MD)  (Signed 17-Feb-13 14:19)  Authored: Brief Consult Note   Last Updated: 17-Feb-13 14:19 by Jill Side (MD)

## 2015-01-04 NOTE — Consult Note (Signed)
PATIENT NAME:  Tracy Jennings, Tracy Jennings MR#:  [id number] DATE OF BIRTH:  [birth year]  DATE OF CONSULTATION:  10/30/2011  REFERRING PHYSICIAN:   CONSULTING PHYSICIAN:  Jill Side, MD  PRIMARY CARE PHYSICIAN:  Dr. Rosario Jacks    REASON FOR CONSULTATION: Acute pancreatitis.   HISTORY OF PRESENT ILLNESS: This is a 59 year old female who presented to the Emergency Room with acute onset of abdominal pain and vomiting about three days ago. The patient was found to have high lipase and was admitted with a diagnosis of acute pancreatitis. According to the patient this is her third episode of acute pancreatitis; the last one was in 2010. She denies drinking alcohol, and according to her she was told that her pancreatitis is due to her diabetic medicines. The patient denies using hydrochlorothiazide or other diuretics. An ultrasound of the abdomen was done in the Emergency Room which showed gallbladder sludge and small stones. Common bile duct is normal in caliber and liver enzymes were normal. The patient feels somewhat better today with mild epigastric pain, denied any further vomiting.   PAST MEDICAL HISTORY:  Pancreatitis, hypertension, and type 2 diabetes.  HOME MEDICATIONS:  Zofran, Percocet, NovoLog, Lantus, Zestril. She has told me about metformin and lisinopril but I do not see them listed as her medicine, and she may have taken them in the past but does not take them anymore.   ALLERGIES: Sulfa.   SOCIAL HISTORY: She does not smoke or drink.   FAMILY HISTORY: Unremarkable except for diabetes and hypertension.   REVIEW OF SYSTEMS: Negative except for what is mentioned in the History of Present Illness.   PHYSICAL EXAMINATION:  GENERAL: Fairly well built female, somewhat obese, does not appear to be in any acute distress.   VITAL SIGNS: Temperature 99.1, pulse 94, respirations 18, blood pressure 148/80.   HEENT: Unremarkable.   NECK: Veins are flat.   ABDOMEN: Fairly benign abdomen. Mild  epigastric tenderness was noted. No rebound or guarding. Bowel sounds are positive and quite regular.   The rest of the physical examination appears to be unremarkable.   LABORATORY, DIAGNOSTIC, AND RADIOLOGICAL DATA: White cell count is normal at 6, hemoglobin 12.5, hematocrit 37.8, platelet count 283. Serum lipase was 1900 on admission, 775 today. Triglycerides are normal. Liver enzymes are normal. Electrolytes, BUN, and creatinine are unremarkable. Ultrasound as above.   ASSESSMENT AND PLAN: The patient is with recurrent pancreatitis. This appears to be her third episode within the last few years. She denies drinking alcohol. Her triglycerides are normal.  Ultrasound of the gallbladder showed some sludge and small stones in the gallbladder which could very well be the cause of her recurrent pancreatitis. There is no evidence of retained common bile duct stone as her bile duct diameter is normal and her liver enzymes are normal. The patient also has high total protein which can sometimes be seen in patients with autoimmune hepatitis, although this seems to be unlikely here. I would recommend continuing n.p.o. for now and starting clear liquid when she starts to feel better. Continue IV hydration and pain control. We will obtain surgical consultation for possible cholecystectomy after resolution of acute pancreatitis. Further recommendations to follow.    ____________________________ Jill Side, MD si:bjt D: 10/30/2011 14:26:56 ET T: 10/30/2011 15:11:10 ET JOB#: [id number]  cc: Jill Side, MD, <Dictator> Isla Pence, MD Jill Side MD ELECTRONICALLY SIGNED 11/01/2011 12:06

## 2015-01-04 NOTE — Consult Note (Signed)
PATIENT NAME:  Tracy Jennings, Tracy Jennings MR#:  [id number] DATE OF BIRTH:  [birth year]  DATE OF CONSULTATION:  10/30/2011  REFERRING PHYSICIAN:  Dr. Jill Side and Dr. Rosario Jacks  CONSULTING PHYSICIAN:  Eoin Willden S. Phylis Bougie, MD  PRIMARY CARE PHYSICIAN: Dr. Rosario Jacks   HISTORY OF PRESENT ILLNESS: This 59 year old female who presented to the Emergency Room with acute onset abdominal pain, vomiting for about three days' duration. Patient was found to have high lipase and was admitted with a diagnosis of acute pancreatitis. According to patient this is her third episode of pancreatitis and lost one was in 2010. She thought that it was due to her drugs which was causing her pancreatitis. She denies any drinking alcohol and according to her she was told that her pancreatitis is due to her diabetic medication. Patient denies taking hydrochlorothiazide or other diuretics and ultrasound abdomen was done in the Emergency Room which showed gallbladder sludge, small stones, common bile duct was normal and liver enzymes are within normal limits. The patient was admitted with abdominal pain.   PAST MEDICAL HISTORY:  1. She had history of pancreatitis twice before. 2. Hypertension. 3. Type 2 diabetes.   HOME MEDICATIONS:  1. Zofran. 2. Percocet. 3. NovoLog. 4. Lantus. 5. Zestril.  6. Metformin. 7. Lisinopril.    ALLERGIES: Sulfa.   SOCIAL HISTORY: She does not smoke or drink.   FAMILY HISTORY: Unremarkable except for diabetes and hypertension.   REVIEW OF SYSTEMS: Negative except for what is mentioned in the history of present illness, mainly abdominal pain and the pain in the back.   PHYSICAL EXAMINATION:  GENERAL: Fairly well-built female, somewhat obese, does not appear to be in any acute distress, lying comfortably.   VITAL SIGNS: Temperature 99, pulse 94, respirations 18, blood pressure 148/80.   HEENT: Normal.   NECK: Neck veins flat.  ABDOMEN: Fairly benign abdomen. Tenderness in the right upper  quadrant of the abdomen. No mass palpable. Good bowel sounds.   LABORATORY, DIAGNOSTIC AND RADIOLOGICAL DATA: Laboratory shows white cell count was normal. Hemoglobin 12.5, hematocrit 37, platelet count 283, serum lipase 1900 at admission and 775 today, it is going down. Triglycerides are normal. Liver enzymes are normal and also the alkaline phosphatase is normal.   ASSESSMENT: This patient I think has acute pancreatitis due to gallstones. She needs surgery at this time. I am going to repeat her amylase, lipase tomorrow and see where we stand and probably do the surgery on the same admission. Patient is agreeing for it at this time. We need to keep her hydrated and n.p.o. when we decide to do the surgery.   ____________________________ Welford Roche Phylis Bougie, MD msh:cms D: 10/30/2011 22:58:31 ET T: 10/31/2011 06:48:48 ET JOB#: [id number]  cc: Ilyse Tremain S. Phylis Bougie, MD, <Dictator> Jill Side, MD Isla Pence, MD Sharene Butters MD ELECTRONICALLY SIGNED 11/01/2011 13:16

## 2015-01-04 NOTE — Op Note (Signed)
PATIENT NAME:  Tracy Jennings, Tracy Jennings MR#:  [id number] DATE OF BIRTH:  08-01-1956  DATE OF PROCEDURE:  11/02/2011  PREOPERATIVE DIAGNOSIS: Acute cholecystitis with pancreatitis.   POSTOPERATIVE DIAGNOSIS: Acute cholecystitis with pancreatitis.   PROCEDURE: Laparoscopic cholecystectomy with cholangiogram.   INDICATION FOR SURGERY: This patient was seen by me because of multiple attacks of pancreatitis 3 times before. Ultrasound showed lithogenic bile with some small stones in the gallbladder. It was decided that the patient probably had pancreatitis due to these gallstones. The patient was then brought to surgery.   DESCRIPTION OF PROCEDURE:   Under general anesthesia, the abdomen was then prepped and draped. A small incision was made above the umbilicus. After cutting skin and subcutaneous tissue, the fascia was reached. Fascia was then cut under direct vision. The peritoneum was entered. After entering the peritoneum, under direct vision the trocar was inserted. The patient did have some adhesions near the umbilical area.  These were avoided.  CO2 was insufflated. Another trocar was put in the epigastric region, two 5-mm trocar were put in the right upper quadrant of the abdomen. In the beginning it was found that we could not see the gallbladder because of a lot of adhesions on the surface of the gallbladder. These adhesions were then released. The gallbladder was then lifted up. All the adhesions were released all the way down. The patient had a very thickened gallbladder. I had to aspirate it out because it was hard to grasp. After aspirating bile, the Hartmann pouch was then dissected laterally. Dissection was done over the cystic duct/gallbladder junction area. The cystic duct was then dissected out nicely. The cystic artery was then clipped and cut. At that time the [facility] catheter was then put in the lower part of the gallbladder with a needle and a cholangiogram was done. It showed that the patient had  a very small common bile duct and the dye was going in the duodenum. I did not see any obvious stones. After that the gallbladder was then transected and dissected off from the liver without any problem. In the end there was no bleeding from the liver or any biliary leak. The gallbladder was removed through the upper port.  Irrigation of the abdomen was performed and we made sure there was no bleeding from the port site. The umbilical port was then closed with interrupted 0 Vicryl sutures and the skin was closed with staples. Marcaine was injected.     The patient tolerated the procedure well and was sent to the recovery room in satisfactory condition.   ____________________________ Welford Roche Phylis Bougie, MD msh:bjt D: 11/02/2011 12:13:45 ET T: 11/02/2011 12:33:39 ET JOB#: [id number]  cc: Kathleen Tamm S. Phylis Bougie, MD, <Dictator> Jill Side, MD Sharene Butters MD ELECTRONICALLY SIGNED 11/08/2011 14:06

## 2015-01-04 NOTE — Consult Note (Signed)
Chief Complaint:   Subjective/Chief Complaint feeling much better   VITAL SIGNS/ANCILLARY NOTES: **Vital Signs.:   19-Feb-13 05:32   Vital Signs Type Routine   Temperature Temperature (F) 98.1   Celsius 36.7   Temperature Source oral   Pulse Pulse 85   Pulse source per Dinamap   Respirations Respirations 20   Systolic BP Systolic BP 622   Diastolic BP (mmHg) Diastolic BP (mmHg) 84   Mean BP 105   BP Source Dinamap   Pulse Ox % Pulse Ox % 97   Pulse Ox Activity Level  At rest   Oxygen Delivery Room Air/ 21 %    07:21   Vital Signs Type POCT   Nurse Fingerstick (mg/dL) FSBS (fasting range 65-99 mg/dL) 158   Comments/Interventions  Nurse Notified    11:06   Vital Signs Type POCT   Nurse Fingerstick (mg/dL) FSBS (fasting range 65-99 mg/dL) 192   Comments/Interventions  Nurse Notified    13:35   Vital Signs Type Q 4hr   Temperature Temperature (F) 98.4   Celsius 36.8   Temperature Source oral   Pulse Pulse 90   Pulse source per Dinamap   Respirations Respirations 20   Systolic BP Systolic BP 297   Diastolic BP (mmHg) Diastolic BP (mmHg) 81   Mean BP 104   BP Source Dinamap   Pulse Ox % Pulse Ox % 100   Pulse Ox Activity Level  At rest   Oxygen Delivery Room Air/ 21 %  *Intake and Output.:   19-Feb-13 02:45   Grand Totals Intake:   Output:      Net:   59 Hr.:  [id number]   Urinary Method  Void; Up to [facility]    05:33   [facility] Intake:   Output:      Net:   18 Hr.:  [id number]   Urinary Method  Void; Up to [facility]    Daily 07:00   [facility] Intake:  7099 Output:      Net:  7099 24 Hr.:  9892   Oral Intake      In:  2240   IV (Primary)      In:  4859   Length of Stay Totals Intake:  7189 Output:      Net:  1194    08:00   Grand Totals Intake:  600 Output:      Net:  174 08 Hr.:  600   Oral Intake      In:  600    12:47   Grand Totals Intake:  120 Output:      Net:  144 81 Hr.:  720   Oral Intake      In:  120   Percentage of Meal Eaten  55    Shift 15:00    Grand Totals Intake:  720 Output:      Net:  856 31 Hr.:  720   Oral Intake      In:  720   Length of Stay Totals Intake:  7909 Output:      Net:  7909   Brief Assessment:   Cardiac Regular    Respiratory normal resp effort    Gastrointestinal Normal    Gastrointestinal details normal Soft  Nontender  Nondistended  No masses palpable  Bowel sounds normal  No rebound tenderness  No gaurding  No rigidity  No organomegaly   Routine Hem:  16-Feb-13 16:25    WBC (CBC) 6.9   RBC (CBC) 4.83  Hemoglobin (CBC) 14.4   Hematocrit (CBC) 43.0   Platelet Count (CBC) 284   MCV 89   MCH 29.8   MCHC 33.5   RDW 12.6   Neutrophil % 69.8   Lymphocyte % 22.6   Monocyte % 6.1   Eosinophil % 1.1   Basophil % 0.4   Neutrophil # 4.8   Lymphocyte # 1.6   Monocyte # 0.4   Eosinophil # 0.1   Basophil # 0.0  Routine Chem:  16-Feb-13 16:25    Glucose, Serum 161   BUN 12   Creatinine (comp) 0.61   Sodium, Serum 139   Potassium, Serum 4.5   Chloride, Serum 100   CO2, Serum 27   Calcium (Total), Serum 9.5  Hepatic:  16-Feb-13 16:25    Bilirubin, Total 0.6   Alkaline Phosphatase 36   SGPT (ALT) 23   SGOT (AST) 29   Total Protein, Serum 8.6   Albumin, Serum 3.8  Routine Chem:  16-Feb-13 16:25    Osmolality (calc) 281   eGFR (African American) >60   eGFR (Non-African American) >60   Anion Gap 12   Lipase 1162  Routine UA:  16-Feb-13 17:31    Color (UA) Yellow   Clarity (UA) Hazy   Glucose (UA) 50 mg/dL   Bilirubin (UA) Negative   Ketones (UA) 1+   Specific Gravity (UA) 1.023   Blood (UA) Negative   pH (UA) 5.0   Protein (UA) 30 mg/dL   Nitrite (UA) Negative   Leukocyte Esterase (UA) Negative   RBC (UA) 1 /HPF   WBC (UA) 4 /HPF   Bacteria (UA) 1+   Epithelial Cells (UA) 3 /HPF   Mucous (UA) PRESENT  Routine Micro:  16-Feb-13 17:31    Specimen Source CC  Routine Hem:  17-Feb-13 04:17    WBC (CBC) 6.0   RBC (CBC) 4.23   Hemoglobin (CBC) 12.5   Hematocrit (CBC)  37.8   Platelet Count (CBC) 283   MCV 89   MCH 29.5   MCHC 33.0   RDW 12.3   Neutrophil % 55.7   Lymphocyte % 35.4   Monocyte % 6.9   Eosinophil % 1.1   Basophil % 0.9   Neutrophil # 3.4   Lymphocyte # 2.1   Monocyte # 0.4   Eosinophil # 0.1   Basophil # 0.1  Routine Chem:  17-Feb-13 04:17    Glucose, Serum 135   BUN 10   Creatinine (comp) 0.75   Sodium, Serum 140   Potassium, Serum 3.7   Chloride, Serum 103   CO2, Serum 25   Calcium (Total), Serum 8.4  Hepatic:  17-Feb-13 04:17    Bilirubin, Total 0.3   Alkaline Phosphatase 31   SGPT (ALT) 16   SGOT (AST) 13   Total Protein, Serum 7.1   Albumin, Serum 3.2  Routine Chem:  17-Feb-13 04:17    Osmolality (calc) 280   eGFR (African American) >60   eGFR (Non-African American) >60   Anion Gap 12   Lipase 775   Cholesterol, Serum 133   Triglycerides, Serum 72   HDL (INHOUSE) 55   VLDL Cholesterol Calculated 14   LDL Cholesterol Calculated 64  Blood Glucose:  17-Feb-13 07:50    POCT Blood Glucose 127    10:57    POCT Blood Glucose 133    16:33    POCT Blood Glucose 222    20:33    POCT Blood Glucose 140  Routine Chem:  18-Feb-13 04:59  Lipase 581  Blood Glucose:  18-Feb-13 07:21    POCT Blood Glucose 156    11:39    POCT Blood Glucose 200    16:16    POCT Blood Glucose 197    20:05    POCT Blood Glucose 208  19-Feb-13 07:23    POCT Blood Glucose 158    11:09    POCT Blood Glucose 192  Hepatic:  19-Feb-13 12:10    Bilirubin, Total 0.4   Alkaline Phosphatase 43   SGPT (ALT) 21   SGOT (AST) 18   Total Protein, Serum 7.6   Albumin, Serum 3.3  Routine Chem:  19-Feb-13 12:10    Lipase 369  Hepatic:  19-Feb-13 12:10    Bilirubin, Direct < 0.1   Assessment/Plan:  Assessment/Plan:   Assessment pt has resolving pancreatitis    Plan surgery tomorrow at 11.30 AM   Electronic Signatures: Vella Kohler (MD)  (Signed 19-Feb-13 14:58)  Authored: Chief Complaint, VITAL SIGNS/ANCILLARY NOTES,  Brief Assessment, Lab Results, Assessment/Plan   Last Updated: 19-Feb-13 14:58 by Vella Kohler (MD)

## 2015-10-19 ENCOUNTER — Encounter: Payer: Self-pay | Admitting: Emergency Medicine

## 2015-10-19 DIAGNOSIS — E1165 Type 2 diabetes mellitus with hyperglycemia: Secondary | ICD-10-CM | POA: Insufficient documentation

## 2015-10-19 DIAGNOSIS — I10 Essential (primary) hypertension: Secondary | ICD-10-CM | POA: Insufficient documentation

## 2015-10-19 LAB — CBC
HCT: 38.6 % (ref 35.0–47.0)
Hemoglobin: 12.6 g/dL (ref 12.0–16.0)
MCH: 28.7 pg (ref 26.0–34.0)
MCHC: 32.8 g/dL (ref 32.0–36.0)
MCV: 87.6 fL (ref 80.0–100.0)
Platelets: 249 10*3/uL (ref 150–440)
RBC: 4.41 MIL/uL (ref 3.80–5.20)
RDW: 13 % (ref 11.5–14.5)
WBC: 8 10*3/uL (ref 3.6–11.0)

## 2015-10-19 LAB — URINALYSIS COMPLETE WITH MICROSCOPIC (ARMC ONLY)
Bilirubin Urine: NEGATIVE
Glucose, UA: >500 mg/dL — AB
Ketones, ur: NEGATIVE mg/dL
Leukocytes, UA: NEGATIVE
Nitrite: NEGATIVE
Protein, ur: 30 mg/dL — AB
Specific Gravity, Urine: 1.022 (ref 1.005–1.030)
pH: 6 (ref 5.0–8.0)

## 2015-10-19 LAB — BASIC METABOLIC PANEL
Anion gap: 7 (ref 5–15)
BUN: 23 mg/dL — ABNORMAL HIGH (ref 6–20)
CO2: 28 mmol/L (ref 22–32)
Calcium: 9.1 mg/dL (ref 8.9–10.3)
Chloride: 101 mmol/L (ref 101–111)
Creatinine, Ser: 1.12 mg/dL — ABNORMAL HIGH (ref 0.44–1.00)
GFR calc Af Amer: >60 mL/min (ref >60)
GFR calc non Af Amer: 53 mL/min — ABNORMAL LOW (ref >60)
Glucose, Bld: 448 mg/dL — ABNORMAL HIGH (ref 65–99)
Potassium: 4 mmol/L (ref 3.5–5.1)
Sodium: 136 mmol/L (ref 135–145)

## 2015-10-19 LAB — GLUCOSE, CAPILLARY: Glucose-Capillary: 387 mg/dL — ABNORMAL HIGH (ref 65–99)

## 2015-10-19 NOTE — ED Notes (Signed)
Pt ambulatory to triage with c/o high blood sugar, pt states she ran out of insulin needles after breakfast cover at 1100 today, reports taking her Lantus last night.  Pt hx of HTN and DM.  Pt reports normal fasting CBG is 170.    Pt also c/o sinsus pressure (above eyes and ears) with cough.

## 2015-10-20 ENCOUNTER — Emergency Department
Admission: EM | Admit: 2015-10-20 | Discharge: 2015-10-20 | Disposition: A | Discharge status: Home / Self Care | Payer: Managed Care, Other (non HMO) | Attending: Emergency Medicine | Admitting: Emergency Medicine

## 2015-10-20 DIAGNOSIS — R739 Hyperglycemia, unspecified: Secondary | ICD-10-CM

## 2015-10-20 HISTORY — DX: Type 2 diabetes mellitus without complications: E11.9

## 2015-10-20 HISTORY — DX: Acute pancreatitis without necrosis or infection, unspecified: K85.90

## 2015-10-20 HISTORY — DX: Essential (primary) hypertension: I10

## 2015-10-20 LAB — GLUCOSE, CAPILLARY
Glucose-Capillary: 343 mg/dL — ABNORMAL HIGH (ref 65–99)
Glucose-Capillary: 299 mg/dL — ABNORMAL HIGH (ref 65–99)

## 2015-10-20 MED ORDER — IBUPROFEN 600 MG PO TABS
600.0000 mg | ORAL_TABLET | Freq: Once | ORAL | Status: AC
  Administered 2015-10-20: 600 mg via ORAL

## 2015-10-20 MED ORDER — INSULIN ASPART 100 UNIT/ML ~~LOC~~ SOLN
10.0000 [IU] | Freq: Once | SUBCUTANEOUS | Status: AC
  Administered 2015-10-20: 10 [IU] via SUBCUTANEOUS
  Filled 2015-10-20: qty 10

## 2015-10-20 MED ORDER — SODIUM CHLORIDE 0.9 % IV BOLUS (SEPSIS)
1000.0000 mL | Freq: Once | INTRAVENOUS | Status: AC
  Administered 2015-10-20: 1000 mL via INTRAVENOUS

## 2015-10-20 MED ORDER — IBUPROFEN 600 MG PO TABS
ORAL_TABLET | ORAL | Status: AC
  Administered 2015-10-20: 600 mg via ORAL
  Filled 2015-10-20: qty 1

## 2015-10-20 NOTE — ED Notes (Signed)
Pt presents to ED for hyperglycemia, states last time she took insulin was 11am yesterday morning. States she has a headache, had vision changes but vision is better now. States vision was blurred but it is clear now.

## 2015-10-20 NOTE — ED Provider Notes (Signed)
[facility] Emergency Department Provider Note  ____________________________________________  Time seen: 4:30 AM I have reviewed the triage vital signs and the nursing notes.   HISTORY  Chief Complaint Hyperglycemia    HPI Tracy Jennings is a 60 y.o. female presents with hyperglycemia patient states that she ran out of her insulin needles after her breakfast dose at 11 AM yesterday. Patient's glucose on presentation 448    Past Medical History  Diagnosis Date  . Pancreatitis   . Diabetes mellitus without complication ([employer])   . Hypertension     There are no active problems to display for this patient.   Past Surgical History  Procedure Laterality Date  . Cataract extraction    . Cholecystectomy    . Eye surgery      No current outpatient prescriptions on file.  Allergies Hctz and Sulfa antibiotics  History reviewed. No pertinent family history.  Social History Social History  Substance Use Topics  . Smoking status: Never Smoker   . Smokeless tobacco: Never Used  . Alcohol Use: No    Review of Systems  Constitutional: Negative for fever. Eyes: Negative for visual changes. ENT: Negative for sore throat. Cardiovascular: Negative for chest pain. Respiratory: Negative for shortness of breath. Gastrointestinal: Negative for abdominal pain, vomiting and diarrhea. Genitourinary: Negative for dysuria. Musculoskeletal: Negative for back pain. Skin: Negative for rash. Neurological: Negative for headaches, focal weakness or numbness.   10-point ROS otherwise negative.  ____________________________________________   PHYSICAL EXAM:  VITAL SIGNS: ED Triage Vitals  Enc Vitals Group     BP 10/19/15 2212 149/74 mmHg     Pulse Rate 10/19/15 2212 97     Resp 10/19/15 2212 18     Temp 10/19/15 2212 98.3 F (36.8 C)     Temp Source 10/19/15 2212 Oral     SpO2 10/19/15 2212 99 %     Weight 10/19/15 2212 185 lb (83.915 kg)     Height  10/19/15 2212 5\' 3"  (1.6 m)     Head Cir --      Peak Flow --      Pain Score 10/19/15 2230 5     Pain Loc --      Pain Edu? --      Excl. in [place]? --      Constitutional: Alert and oriented. Well appearing and in no distress. Eyes: Conjunctivae are normal. PERRL. Normal extraocular movements. ENT   Head: Normocephalic and atraumatic.   Nose: No congestion/rhinnorhea.   Mouth/Throat: Mucous membranes are moist.   Neck: No stridor. Hematological/Lymphatic/Immunilogical: No cervical lymphadenopathy. Cardiovascular: Normal rate, regular rhythm. Normal and symmetric distal pulses are present in all extremities. No murmurs, rubs, or gallops. Respiratory: Normal respiratory effort without tachypnea nor retractions. Breath sounds are clear and equal bilaterally. No wheezes/rales/rhonchi. Gastrointestinal: Soft and nontender. No distention. There is no CVA tenderness. Genitourinary: deferred Musculoskeletal: Nontender with normal range of motion in all extremities. No joint effusions.  No lower extremity tenderness nor edema. Neurologic:  Normal speech and language. No gross focal neurologic deficits are appreciated. Speech is normal.  Skin:  Skin is warm, dry and intact. No rash noted. Psychiatric: Mood and affect are normal. Speech and behavior are normal. Patient exhibits appropriate insight and judgment.  ____________________________________________    LABS (pertinent positives/negatives)  Labs Reviewed  GLUCOSE, CAPILLARY - Abnormal; Notable for the following:    Glucose-Capillary 387 (*)    All other components within normal limits  BASIC METABOLIC PANEL -  Abnormal; Notable for the following:    Glucose, Bld 448 (*)    BUN 23 (*)    Creatinine, Ser 1.12 (*)    GFR calc non Af Amer 53 (*)    All other components within normal limits  URINALYSIS COMPLETEWITH MICROSCOPIC ([facility] ONLY) - Abnormal; Notable for the following:    Color, Urine STRAW (*)    APPearance CLEAR  (*)    Glucose, UA >500 (*)    Hgb urine dipstick 1+ (*)    Protein, ur 30 (*)    Bacteria, UA RARE (*)    Squamous Epithelial / LPF 0-5 (*)    All other components within normal limits  GLUCOSE, CAPILLARY - Abnormal; Notable for the following:    Glucose-Capillary 343 (*)    All other components within normal limits  GLUCOSE, CAPILLARY - Abnormal; Notable for the following:    Glucose-Capillary 299 (*)    All other components within normal limits  CBC        INITIAL IMPRESSION / ASSESSMENT AND PLAN / ED COURSE  Pertinent labs & imaging results that were available during my care of the patient were reviewed by me and considered in my medical decision making (see chart for details).  Patient received 1 L IV normal saline as well as 10 units subcutaneous insulin based on her home sliding scale dosage. Repeat glucose 299. Patient will be given 10 insulin syringe with needles in the emergency department now and will be prescribed insulin syringes for home.  ____________________________________________   FINAL CLINICAL IMPRESSION(S) / ED DIAGNOSES  Final diagnoses:  Hyperglycemia      Gregor Hams, MD 10/20/15 [phone]

## 2015-10-20 NOTE — Discharge Instructions (Signed)
Hyperglycemia °Hyperglycemia occurs when the glucose (sugar) in your blood is too high. Hyperglycemia can happen for many reasons, but it most often happens to people who do not know they have diabetes or are not managing their diabetes properly.  °CAUSES  °Whether you have diabetes or not, there are other causes of hyperglycemia. Hyperglycemia can occur when you have diabetes, but it can also occur in other situations that you might not be as aware of, such as: °Diabetes °· If you have diabetes and are having problems controlling your blood glucose, hyperglycemia could occur because of some of the following reasons: °¨ Not following your meal plan. °¨ Not taking your diabetes medications or not taking it properly. °¨ Exercising less or doing less activity than you normally do. °¨ Being sick. °Pre-diabetes °· This cannot be ignored. Before people develop Type 2 diabetes, they almost always have "pre-diabetes." This is when your blood glucose levels are higher than normal, but not yet high enough to be diagnosed as diabetes. Research has shown that some long-term damage to the body, especially the heart and circulatory system, may already be occurring during pre-diabetes. If you take action to manage your blood glucose when you have pre-diabetes, you may delay or prevent Type 2 diabetes from developing. °Stress °· If you have diabetes, you may be "diet" controlled or on oral medications or insulin to control your diabetes. However, you may find that your blood glucose is higher than usual in the hospital whether you have diabetes or not. This is often referred to as "stress hyperglycemia." Stress can elevate your blood glucose. This happens because of hormones put out by the body during times of stress. If stress has been the cause of your high blood glucose, it can be followed regularly by your caregiver. That way he/she can make sure your hyperglycemia does not continue to get worse or progress to  diabetes. °Steroids °· Steroids are medications that act on the infection fighting system (immune system) to block inflammation or infection. One side effect can be a rise in blood glucose. Most people can produce enough extra insulin to allow for this rise, but for those who cannot, steroids make blood glucose levels go even higher. It is not unusual for steroid treatments to "uncover" diabetes that is developing. It is not always possible to determine if the hyperglycemia will go away after the steroids are stopped. A special blood test called an A1c is sometimes done to determine if your blood glucose was elevated before the steroids were started. °SYMPTOMS °· Thirsty. °· Frequent urination. °· Dry mouth. °· Blurred vision. °· Tired or fatigue. °· Weakness. °· Sleepy. °· Tingling in feet or leg. °DIAGNOSIS  °Diagnosis is made by monitoring blood glucose in one or all of the following ways: °· A1c test. This is a chemical found in your blood. °· Fingerstick blood glucose monitoring. °· Laboratory results. °TREATMENT  °First, knowing the cause of the hyperglycemia is important before the hyperglycemia can be treated. Treatment may include, but is not be limited to: °· Education. °· Change or adjustment in medications. °· Change or adjustment in meal plan. °· Treatment for an illness, infection, etc. °· More frequent blood glucose monitoring. °· Change in exercise plan. °· Decreasing or stopping steroids. °· Lifestyle changes. °HOME CARE INSTRUCTIONS  °· Test your blood glucose as directed. °· Exercise regularly. Your caregiver will give you instructions about exercise. Pre-diabetes or diabetes which comes on with stress is helped by exercising. °· Eat wholesome,   balanced meals. Eat often and at regular, fixed times. Your caregiver or nutritionist will give you a meal plan to guide your sugar intake. °· Being at an ideal weight is important. If needed, losing as little as 10 to 15 pounds may help improve blood  glucose levels. °SEEK MEDICAL CARE IF:  °· You have questions about medicine, activity, or diet. °· You continue to have symptoms (problems such as increased thirst, urination, or weight gain). °SEEK IMMEDIATE MEDICAL CARE IF:  °· You are vomiting or have diarrhea. °· Your breath smells fruity. °· You are breathing faster or slower. °· You are very sleepy or incoherent. °· You have numbness, tingling, or pain in your feet or hands. °· You have chest pain. °· Your symptoms get worse even though you have been following your caregiver's orders. °· If you have any other questions or concerns. °  °This information is not intended to replace advice given to you by your health care provider. Make sure you discuss any questions you have with your health care provider. °  °Document Released: 02/22/2001 Document Revised: 11/21/2011 Document Reviewed: 05/05/2015 °Elsevier Interactive Patient Education ©2016 [employer] ° °

## 2015-10-20 NOTE — ED Notes (Signed)
IV placed in [facility] due to lack of selection sites for IV Placement.

## 2016-08-07 ENCOUNTER — Emergency Department: Payer: Managed Care, Other (non HMO)

## 2016-08-07 ENCOUNTER — Encounter: Payer: Self-pay | Admitting: Emergency Medicine

## 2016-08-07 ENCOUNTER — Emergency Department
Admission: EM | Admit: 2016-08-07 | Discharge: 2016-08-07 | Disposition: A | Discharge status: Home / Self Care | Payer: Managed Care, Other (non HMO) | Attending: Student in an Organized Health Care Education/Training Program | Admitting: Student in an Organized Health Care Education/Training Program

## 2016-08-07 DIAGNOSIS — R1013 Epigastric pain: Secondary | ICD-10-CM | POA: Diagnosis present

## 2016-08-07 DIAGNOSIS — E119 Type 2 diabetes mellitus without complications: Secondary | ICD-10-CM | POA: Diagnosis not present

## 2016-08-07 DIAGNOSIS — K859 Acute pancreatitis without necrosis or infection, unspecified: Secondary | ICD-10-CM | POA: Insufficient documentation

## 2016-08-07 DIAGNOSIS — Z79899 Other long term (current) drug therapy: Secondary | ICD-10-CM | POA: Insufficient documentation

## 2016-08-07 DIAGNOSIS — I10 Essential (primary) hypertension: Secondary | ICD-10-CM | POA: Insufficient documentation

## 2016-08-07 DIAGNOSIS — Z794 Long term (current) use of insulin: Secondary | ICD-10-CM | POA: Diagnosis not present

## 2016-08-07 DIAGNOSIS — R112 Nausea with vomiting, unspecified: Secondary | ICD-10-CM

## 2016-08-07 LAB — COMPREHENSIVE METABOLIC PANEL
ALT: 14 U/L (ref 14–54)
AST: 18 U/L (ref 15–41)
Albumin: 3.1 g/dL — ABNORMAL LOW (ref 3.5–5.0)
Alkaline Phosphatase: 46 U/L (ref 38–126)
Anion gap: 7 (ref 5–15)
BUN: 14 mg/dL (ref 6–20)
CO2: 30 mmol/L (ref 22–32)
Calcium: 8.5 mg/dL — ABNORMAL LOW (ref 8.9–10.3)
Chloride: 99 mmol/L — ABNORMAL LOW (ref 101–111)
Creatinine, Ser: 1.11 mg/dL — ABNORMAL HIGH (ref 0.44–1.00)
GFR calc Af Amer: >60 mL/min (ref >60)
GFR calc non Af Amer: 54 mL/min — ABNORMAL LOW (ref >60)
Glucose, Bld: 264 mg/dL — ABNORMAL HIGH (ref 65–99)
Potassium: 4 mmol/L (ref 3.5–5.1)
Sodium: 136 mmol/L (ref 135–145)
Total Bilirubin: 0.7 mg/dL (ref 0.3–1.2)
Total Protein: 6.9 g/dL (ref 6.5–8.1)

## 2016-08-07 LAB — URINALYSIS COMPLETE WITH MICROSCOPIC (ARMC ONLY)
Bilirubin Urine: NEGATIVE
Glucose, UA: >500 mg/dL — AB
Hgb urine dipstick: NEGATIVE
Ketones, ur: NEGATIVE mg/dL
Leukocytes, UA: NEGATIVE
Nitrite: NEGATIVE
Protein, ur: >500 mg/dL — AB
Specific Gravity, Urine: 1.024 (ref 1.005–1.030)
pH: 6 (ref 5.0–8.0)

## 2016-08-07 LAB — CBC
HCT: 38.2 % (ref 35.0–47.0)
Hemoglobin: 12.8 g/dL (ref 12.0–16.0)
MCH: 28.9 pg (ref 26.0–34.0)
MCHC: 33.5 g/dL (ref 32.0–36.0)
MCV: 86.1 fL (ref 80.0–100.0)
Platelets: 269 10*3/uL (ref 150–440)
RBC: 4.44 MIL/uL (ref 3.80–5.20)
RDW: 13.2 % (ref 11.5–14.5)
WBC: 6.9 10*3/uL (ref 3.6–11.0)

## 2016-08-07 LAB — TROPONIN I: Troponin I: <0.03 ng/mL (ref <0.03)

## 2016-08-07 LAB — LIPASE, BLOOD: Lipase: 76 U/L — ABNORMAL HIGH (ref 11–51)

## 2016-08-07 MED ORDER — PROMETHAZINE HCL 25 MG/ML IJ SOLN
12.5000 mg | Freq: Once | INTRAMUSCULAR | Status: AC
  Administered 2016-08-07: 12.5 mg via INTRAVENOUS
  Filled 2016-08-07: qty 1

## 2016-08-07 MED ORDER — SODIUM CHLORIDE 0.9 % IV BOLUS (SEPSIS)
1000.0000 mL | Freq: Once | INTRAVENOUS | Status: AC
  Administered 2016-08-07: 1000 mL via INTRAVENOUS

## 2016-08-07 MED ORDER — NALOXONE HCL 2 MG/2ML IJ SOSY
PREFILLED_SYRINGE | INTRAMUSCULAR | Status: AC
  Administered 2016-08-07: 0.4 mg
  Filled 2016-08-07: qty 2

## 2016-08-07 MED ORDER — IOPAMIDOL (ISOVUE-300) INJECTION 61%
100.0000 mL | Freq: Once | INTRAVENOUS | Status: AC | PRN
  Administered 2016-08-07: 100 mL via INTRAVENOUS

## 2016-08-07 MED ORDER — HYDROCODONE-ACETAMINOPHEN 5-325 MG PO TABS
1.0000 | ORAL_TABLET | Freq: Once | ORAL | Status: AC
  Administered 2016-08-07: 1 via ORAL
  Filled 2016-08-07: qty 1

## 2016-08-07 MED ORDER — PROMETHAZINE HCL 12.5 MG PO TABS: 12.5000 mg | ORAL_TABLET | Freq: Four times a day (QID) | ORAL | 0 refills | Status: DC | PRN

## 2016-08-07 MED ORDER — FENTANYL CITRATE (PF) 100 MCG/2ML IJ SOLN
100.0000 ug | INTRAMUSCULAR | Status: DC | PRN
  Administered 2016-08-07: 100 ug via INTRAVENOUS
  Filled 2016-08-07: qty 2

## 2016-08-07 MED ORDER — HYDROCODONE-ACETAMINOPHEN 5-325 MG PO TABS: 1.0000 | ORAL_TABLET | ORAL | 0 refills | Status: DC | PRN

## 2016-08-07 MED ORDER — METOCLOPRAMIDE HCL 5 MG/ML IJ SOLN
10.0000 mg | Freq: Once | INTRAMUSCULAR | Status: AC
  Administered 2016-08-07: 10 mg via INTRAVENOUS
  Filled 2016-08-07: qty 2

## 2016-08-07 NOTE — ED Triage Notes (Signed)
C/o epigastric pain with nausea and vomiting. Denies diarrhea. NAD at triage. Denies fevers.

## 2016-08-07 NOTE — ED Notes (Signed)
Advised pt to call for family to drive her home. Pt stated she did but her family was out shopping the "black Friday" sale. Pt is A/O x4 and ambulatory prior to departure

## 2016-08-07 NOTE — ED Notes (Signed)
Pt transported to CT ?

## 2016-08-07 NOTE — ED Notes (Signed)
MD at bedside post fentanyl administration ; pt with decreased respirations 7-8 , drowsy and unresponsive to voice. Applied 3 L [place] , and 0.4 mg narcan . 5 minutes later pt respirations 12-16 , responsive to voice, maintaining airway.

## 2016-08-07 NOTE — ED Notes (Signed)
Pt tolerated PO challenge

## 2016-08-07 NOTE — ED Provider Notes (Signed)
[facility] Emergency Department Provider Note    None    (approximate)  I have reviewed the triage vital signs and the nursing notes.   HISTORY  Chief Complaint Abdominal Pain    HPI Tracy Jennings is a 60 y.o. female  who presents with acute epigastric pain associated with nausea and vomiting. Patient states it feels similar to her previous episode of pancreatitis. Denies any alcohol abuse recently. Did have Thanksgiving dinner which may have upset her stomach. Denies any diarrhea. No shortness of breath. No chest pain. Medications are viral illnesses.   Past Medical History:  Diagnosis Date  . Diabetes mellitus without complication ([place])   . Hypertension   . Pancreatitis    History reviewed. No pertinent family history. Past Surgical History:  Procedure Laterality Date  . CATARACT EXTRACTION    . CHOLECYSTECTOMY    . EYE SURGERY     There are no active problems to display for this patient.     Prior to Admission medications   Not on File    Allergies Hctz [hydrochlorothiazide] and Sulfa antibiotics    Social History Social History  Substance Use Topics  . Smoking status: Never Smoker  . Smokeless tobacco: Never Used  . Alcohol use No    Review of Systems Patient denies headaches, rhinorrhea, blurry vision, numbness, shortness of breath, chest pain, edema, cough, abdominal pain, nausea, vomiting, diarrhea, dysuria, fevers, rashes or hallucinations unless otherwise stated above in HPI. ____________________________________________   PHYSICAL EXAM:  VITAL SIGNS: Vitals:   08/07/16 1054  BP: (!) 153/75  Pulse: 82  Resp: 16  Temp: 98.1 F (36.7 C)    Constitutional: Alert and oriented. Well appearing and in no acute distress. Eyes: Conjunctivae are normal. PERRL. EOMI. Head: Atraumatic. Nose: No congestion/rhinnorhea. Mouth/Throat: Mucous membranes are moist.  Oropharynx non-erythematous. Neck: No stridor. Painless  ROM. No cervical spine tenderness to palpation Hematological/Lymphatic/Immunilogical: No cervical lymphadenopathy. Cardiovascular: Normal rate, regular rhythm. Grossly normal heart sounds.  Good peripheral circulation. Respiratory: Normal respiratory effort.  No retractions. Lungs CTAB. Gastrointestinal: Soft with mild epigastric ttp. No distention. No abdominal bruits. No CVA tenderness. Musculoskeletal: No lower extremity tenderness nor edema.  No joint effusions. Neurologic:  Normal speech and language. No gross focal neurologic deficits are appreciated. No gait instability. Skin:  Skin is warm, dry and intact. No rash noted. Psychiatric: Mood and affect are normal. Speech and behavior are normal.  ____________________________________________   LABS (all labs ordered are listed, but only abnormal results are displayed)  Results for orders placed or performed during the hospital encounter of 08/07/16 (from the past 24 hour(s))  Lipase, blood     Status: Abnormal   Collection Time: 08/07/16 10:52 AM  Result Value Ref Range   Lipase 76 (H) 11 - 51 U/L  Comprehensive metabolic panel     Status: Abnormal   Collection Time: 08/07/16 10:52 AM  Result Value Ref Range   Sodium 136 135 - 145 mmol/L   Potassium 4.0 3.5 - 5.1 mmol/L   Chloride 99 (L) 101 - 111 mmol/L   CO2 30 22 - 32 mmol/L   Glucose, Bld 264 (H) 65 - 99 mg/dL   BUN 14 6 - 20 mg/dL   Creatinine, Ser 1.11 (H) 0.44 - 1.00 mg/dL   Calcium 8.5 (L) 8.9 - 10.3 mg/dL   Total Protein 6.9 6.5 - 8.1 g/dL   Albumin 3.1 (L) 3.5 - 5.0 g/dL   AST 18 15 - 41 U/L  ALT 14 14 - 54 U/L   Alkaline Phosphatase 46 38 - 126 U/L   Total Bilirubin 0.7 0.3 - 1.2 mg/dL   GFR calc non Af Amer 54 (L) >60 mL/min   GFR calc Af Amer >60 >60 mL/min   Anion gap 7 5 - 15  CBC     Status: None   Collection Time: 08/07/16 10:52 AM  Result Value Ref Range   WBC 6.9 3.6 - 11.0 K/uL   RBC 4.44 3.80 - 5.20 MIL/uL   Hemoglobin 12.8 12.0 - 16.0 g/dL    HCT 38.2 35.0 - 47.0 %   MCV 86.1 80.0 - 100.0 fL   MCH 28.9 26.0 - 34.0 pg   MCHC 33.5 32.0 - 36.0 g/dL   RDW 13.2 11.5 - 14.5 %   Platelets 269 150 - 440 K/uL  Urinalysis complete, with microscopic     Status: Abnormal   Collection Time: 08/07/16 10:52 AM  Result Value Ref Range   Color, Urine YELLOW (A) YELLOW   APPearance CLOUDY (A) CLEAR   Glucose, UA >500 (A) NEGATIVE mg/dL   Bilirubin Urine NEGATIVE NEGATIVE   Ketones, ur NEGATIVE NEGATIVE mg/dL   Specific Gravity, Urine 1.024 1.005 - 1.030   Hgb urine dipstick NEGATIVE NEGATIVE   pH 6.0 5.0 - 8.0   Protein, ur >500 (A) NEGATIVE mg/dL   Nitrite NEGATIVE NEGATIVE   Leukocytes, UA NEGATIVE NEGATIVE   RBC / HPF 6-30 0 - 5 RBC/hpf   WBC, UA 0-5 0 - 5 WBC/hpf   Bacteria, UA RARE (A) NONE SEEN   Squamous Epithelial / LPF 0-5 (A) NONE SEEN   Mucous PRESENT    Hyaline Casts, UA PRESENT   Troponin I     Status: None   Collection Time: 08/07/16 10:52 AM  Result Value Ref Range   Troponin I <0.03 <0.03 ng/mL   ____________________________________________  EKG My review and personal interpretation at Time: 10:52   Indication: epigastric pain  Rate: 80  Rhythm: sinus Axis: normal Other: normal intervals, non specific t wave changes and st cahnges consistent with previous 12/09/13 ____________________________________________  RADIOLOGY  I personally reviewed all radiographic images ordered to evaluate for the above acute complaints and reviewed radiology reports and findings.  These findings were personally discussed with the patient.  Please see medical record for radiology report.  ____________________________________________   PROCEDURES  Procedure(s) performed: none Procedures    Critical Care performed: no ____________________________________________   INITIAL IMPRESSION / ASSESSMENT AND PLAN / ED COURSE  Pertinent labs & imaging results that were available during my care of the patient were reviewed by me and  considered in my medical decision making (see chart for details).  DDX: pancreatitis, gastritis, duodenitis, pna, colitis, diverticulitis  Tracy Jennings is a 60 y.o. who presents to the ED with Epigastric pain for the last 2 days.  Patient is AFVSS in ED. Exam as above. Given current presentation have considered the above differential.  Based on history and presentation I am concerned for pathology such as acute pancreatitis with possible pseudocyst or hemorrhagic pancreatitis. Will order CT imaging to evaluate for acute process. We'll provide IV pain medication as well as IV fluids and IV antinausea.  The patient will be placed on continuous pulse oximetry and telemetry for monitoring.  Laboratory evaluation will be sent to evaluate for the above complaints.      Clinical Course as of Aug 07 1630  Nancy Fetter Aug 07, 2016  1305 Called to bedside  as the patient had significant decreased respirations after fentanyl administration. Patient did not become hypoxic. Patient administered 0.4 mg of Narcan with improvement in her symptoms. We'll continue to monitor.  [PR]  7096 CT imaging with evidence of acute pancreatitis. Patient is in no acute distress.  [PR]  1501 Discussed results of imaging with patient. I recommended admission for IV fluids and symptomatic management. Patient is requesting discharge home. Will provide oral pain medication and by mouth challenge and reassess.  [PR]  1625 Patient remains stable and was able to tolerate oral hydration and food. I reiterated my recommendation for her to be admitted for IV fluids. .  She is currently requesting discharge home. We'll discharge home with pain medication and antinausea medications. Encouraged patient to return to the ER should her symptoms worsen.  [PR]    Clinical Course User Index [PR] Merlyn Lot, MD     ____________________________________________   FINAL CLINICAL IMPRESSION(S) / ED DIAGNOSES  Final diagnoses:  Acute  pancreatitis without infection or necrosis, unspecified pancreatitis type  Non-intractable vomiting with nausea, unspecified vomiting type      NEW MEDICATIONS STARTED DURING THIS VISIT:  New Prescriptions   No medications on file     Note:  This document was prepared using Dragon voice recognition software and may include unintentional dictation errors.    Merlyn Lot, MD 08/07/16 [phone]

## 2016-09-12 HISTORY — PX: EYE SURGERY: SHX253

## 2017-05-02 ENCOUNTER — Other Ambulatory Visit: Payer: Self-pay | Admitting: Family Medicine

## 2017-05-02 DIAGNOSIS — Z1231 Encounter for screening mammogram for malignant neoplasm of breast: Secondary | ICD-10-CM

## 2017-05-22 ENCOUNTER — Ambulatory Visit
Admission: RE | Admit: 2017-05-22 | Discharge: 2017-05-22 | Disposition: A | Discharge status: Home / Self Care | Payer: Managed Care, Other (non HMO) | Source: Ambulatory Visit | Attending: Family Medicine | Admitting: Family Medicine

## 2017-05-22 DIAGNOSIS — Z1231 Encounter for screening mammogram for malignant neoplasm of breast: Secondary | ICD-10-CM | POA: Insufficient documentation

## 2017-08-08 ENCOUNTER — Emergency Department
Admission: EM | Admit: 2017-08-08 | Discharge: 2017-08-08 | Disposition: A | Discharge status: Home / Self Care | Payer: Managed Care, Other (non HMO) | Attending: Emergency Medicine | Admitting: Emergency Medicine

## 2017-08-08 ENCOUNTER — Encounter: Payer: Self-pay | Admitting: Emergency Medicine

## 2017-08-08 ENCOUNTER — Other Ambulatory Visit: Payer: Self-pay

## 2017-08-08 ENCOUNTER — Emergency Department: Payer: Managed Care, Other (non HMO)

## 2017-08-08 DIAGNOSIS — J157 Pneumonia due to Mycoplasma pneumoniae: Secondary | ICD-10-CM | POA: Diagnosis not present

## 2017-08-08 DIAGNOSIS — Z794 Long term (current) use of insulin: Secondary | ICD-10-CM | POA: Diagnosis not present

## 2017-08-08 DIAGNOSIS — I10 Essential (primary) hypertension: Secondary | ICD-10-CM | POA: Diagnosis not present

## 2017-08-08 DIAGNOSIS — Z79899 Other long term (current) drug therapy: Secondary | ICD-10-CM | POA: Insufficient documentation

## 2017-08-08 DIAGNOSIS — Z760 Encounter for issue of repeat prescription: Secondary | ICD-10-CM | POA: Insufficient documentation

## 2017-08-08 DIAGNOSIS — E119 Type 2 diabetes mellitus without complications: Secondary | ICD-10-CM | POA: Insufficient documentation

## 2017-08-08 DIAGNOSIS — J029 Acute pharyngitis, unspecified: Secondary | ICD-10-CM | POA: Diagnosis present

## 2017-08-08 LAB — INFLUENZA PANEL BY PCR (TYPE A & B)
Influenza A By PCR: NEGATIVE
Influenza B By PCR: NEGATIVE

## 2017-08-08 LAB — POCT RAPID STREP A: Streptococcus, Group A Screen (Direct): NEGATIVE

## 2017-08-08 MED ORDER — DILTIAZEM HCL ER COATED BEADS 120 MG PO CP24
120.0000 mg | ORAL_CAPSULE | Freq: Once | ORAL | Status: AC
  Administered 2017-08-08: 120 mg via ORAL
  Filled 2017-08-08: qty 1

## 2017-08-08 MED ORDER — AZITHROMYCIN 250 MG PO TABS: ORAL_TABLET | ORAL | 0 refills | Status: DC

## 2017-08-08 MED ORDER — DILTIAZEM HCL ER COATED BEADS 120 MG PO TB24: 120.0000 mg | ORAL_TABLET | Freq: Every day | ORAL | 1 refills | Status: DC

## 2017-08-08 MED ORDER — PSEUDOEPH-BROMPHEN-DM 30-2-10 MG/5ML PO SYRP: 10.0000 mL | ORAL_SOLUTION | Freq: Four times a day (QID) | ORAL | 0 refills | Status: DC | PRN

## 2017-08-08 MED ORDER — MAGIC MOUTHWASH W/LIDOCAINE: 5.0000 mL | Freq: Four times a day (QID) | ORAL | 0 refills | Status: DC

## 2017-08-08 NOTE — ED Provider Notes (Signed)
[facility] Emergency Department Provider Note  ____________________________________________  Time seen: Approximately 4:11 PM  I have reviewed the triage vital signs and the nursing notes.   HISTORY  Chief Complaint Generalized Body Aches and Sore Throat    HPI Tracy Jennings is a 61 y.o. female is the emergency department complaining of nasal congestion, sore throat, cough times 2-3 weeks.  Patient reports that initially symptoms were minimal but have increased especially over the past several days.  Patient has been taking multiple over-the-counter medications with no significant relief.  Patient denies any headache, visual changes, chest pain, shortness of breath, abdominal pain, nausea vomiting.  Cough is nonproductive.  Patient reports that the sore throat has drastically worsened over the past 2 days but denies any difficulty breathing or swallowing.  Patient denies any changes in her voice.  Patient has nasal congestion but denies any sinus pressure or headaches.  Cough is nonproductive, does not, shortness of breath.  No other complaints at this time.  Past Medical History:  Diagnosis Date  . Diabetes mellitus without complication ([place])   . Hypertension   . Pancreatitis     There are no active problems to display for this patient.   Past Surgical History:  Procedure Laterality Date  . CATARACT EXTRACTION    . CHOLECYSTECTOMY    . EYE SURGERY      Prior to Admission medications   Medication Sig Start Date End Date Taking? Authorizing Provider  amLODipine (NORVASC) 5 MG tablet Take 1 tablet by mouth once daily 01/18/16   [provider]  atorvastatin (LIPITOR) 40 MG tablet Take by mouth. 10/05/15 10/04/16  [provider]  azithromycin (ZITHROMAX Z-PAK) 250 MG tablet Take 2 tablets (500 mg) on  Day 1,  followed by 1 tablet (250 mg) once daily on Days 2 through 5. 08/08/17   Nisreen Guise, Roderic Palau D, PA-C   brompheniramine-pseudoephedrine-DM 30-2-10 MG/5ML syrup Take 10 mLs by mouth 4 (four) times daily as needed. 08/08/17   Geneieve Duell, Charline Bills, PA-C  diltiazem (CARDIZEM LA) 120 MG 24 hr tablet Take 1 tablet (120 mg total) by mouth daily. 08/08/17   Albi Rappaport, Charline Bills, PA-C  HYDROcodone-acetaminophen (NORCO) 5-325 MG tablet Take 1 tablet by mouth every 4 (four) hours as needed for moderate pain. 08/07/16   Merlyn Lot, MD  insulin aspart (NOVOLOG) 100 UNIT/ML FlexPen Inject 10-12 Units into the skin.  12/28/15   [provider]  Insulin Glargine 300 UNIT/ML SOPN Inject 40 Units into the skin daily.  11/30/15   [provider]  losartan (COZAAR) 100 MG tablet Take 1 tablet by mouth once daily 01/18/16   [provider]  magic mouthwash w/lidocaine SOLN Take 5 mLs by mouth 4 (four) times daily. 08/08/17   Branae Crail, Charline Bills, PA-C  metFORMIN (GLUCOPHAGE) 500 MG tablet Take 1,000 mg by mouth. 12/19/14   [provider]  metoprolol (LOPRESSOR) 50 MG tablet Take 50 mg by mouth. 02/20/15   [provider]  promethazine (PHENERGAN) 12.5 MG tablet Take 1 tablet (12.5 mg total) by mouth every 6 (six) hours as needed for nausea or vomiting. 08/07/16   Merlyn Lot, MD    Allergies Hctz [hydrochlorothiazide] and Sulfa antibiotics  No family history on file.  Social History Social History   Tobacco Use  . Smoking status: Never Smoker  . Smokeless tobacco: Never Used  Substance Use Topics  . Alcohol use: No  . Drug use: Not on file     Review  of Systems  Constitutional: Intermittent low-grade fever/chills Eyes: No visual changes. No discharge ENT: Positive for nasal congestion and sore throat. Cardiovascular: no chest pain. Respiratory: Positive cough. No SOB. Gastrointestinal: No abdominal pain.  No nausea, no vomiting.  No diarrhea.  No constipation. Musculoskeletal: Negative for musculoskeletal pain. Skin: Negative for rash, abrasions,  lacerations, ecchymosis. Neurological: Negative for headaches, focal weakness or numbness. 10-point ROS otherwise negative.  ____________________________________________   PHYSICAL EXAM:  VITAL SIGNS: ED Triage Vitals  Enc Vitals Group     BP 08/08/17 1610 (!) 213/96     Pulse Rate 08/08/17 1610 90     Resp 08/08/17 1610 20     Temp 08/08/17 1610 98 F (36.7 C)     Temp Source 08/08/17 1610 Oral     SpO2 08/08/17 1610 97 %     Weight 08/08/17 1611 189 lb (85.7 kg)     Height 08/08/17 1611 5\' 4"  (1.626 m)     Head Circumference --      Peak Flow --      Pain Score 08/08/17 1610 6     Pain Loc --      Pain Edu? --      Excl. in [place]? --      Constitutional: Alert and oriented. Well appearing and in no acute distress. Eyes: Conjunctivae are normal. PERRL. EOMI. Head: Atraumatic. ENT:      Ears: EACs and TMs unremarkable bilaterally.      Nose: Mild congestion/rhinnorhea.  Orbits are erythematous and edematous.  Patient is nontender to percussion over the sinuses.      Mouth/Throat: Mucous membranes are moist.  Oropharynx is erythematous but nonedematous.  Tonsils are mildly erythematous and edematous but no exudates.  Uvula is midline. Neck: No stridor.  Neck is supple full range of motion Hematological/Lymphatic/Immunilogical: No cervical lymphadenopathy. Cardiovascular: Normal rate, regular rhythm. Normal S1 and S2.  Good peripheral circulation. Respiratory: Normal respiratory effort without tachypnea or retractions. Lungs with scattered coarse breath sounds on the left.  No rales or no definitive wheezing.Kermit Balo air entry to the bases with no decreased or absent breath sounds. Musculoskeletal: Full range of motion to all extremities. No gross deformities appreciated. Neurologic:  Normal speech and language. No gross focal neurologic deficits are appreciated.  Skin:  Skin is warm, dry and intact. No rash noted. Psychiatric: Mood and affect are normal. Speech and behavior are  normal. Patient exhibits appropriate insight and judgement.   ____________________________________________   LABS (all labs ordered are listed, but only abnormal results are displayed)  Labs Reviewed  INFLUENZA PANEL BY PCR (TYPE A & B)  POCT RAPID STREP A   ____________________________________________  EKG   ____________________________________________  RADIOLOGY Diamantina Providence Haylen Shelnutt, personally viewed and evaluated these images (plain radiographs) as part of my medical decision making, as well as reviewing the written report by the radiologist.  Dg Chest 2 View  Result Date: 08/08/2017 CLINICAL DATA:  61 year old with cough and body aches.  Sore throat. EXAM: CHEST  2 VIEW COMPARISON:  06/13/2014 FINDINGS: Both lungs are clear. Negative for a pneumothorax. Heart and mediastinum are within normal limits. Trachea is midline. No pleural effusions. No acute bone abnormality. IMPRESSION: No active cardiopulmonary disease. Electronically Signed   By: Markus Daft M.D.   On: 08/08/2017 17:33    ____________________________________________    PROCEDURES  Procedure(s) performed:    Procedures    Medications - No data to display   ____________________________________________   INITIAL IMPRESSION /  ASSESSMENT AND PLAN / ED COURSE  Pertinent labs & imaging results that were available during my care of the patient were reviewed by me and considered in my medical decision making (see chart for details).  Review of the [place] CSRS was performed in accordance of the [place] prior to dispensing any controlled drugs.  Clinical Course as of Aug 09 1819  Tue Aug 08, 2017  1634 Patient presents with 2-3-week history of nasal congestion, sore throat, cough that is worsening.  Multiple over-the-counter medications with no improvement.  Patient reports that the cold and cough medication increases her blood pressure and she is also nervous contributing to elevated blood pressure reading at  this time.  Patient denies any headache, visual changes.  Initial differential includes viral URI, sinusitis, strep, viral pharyngitis, bronchitis, community-acquired pneumonia, influenza.  Patient will be tested with influenza, strep, chest x-ray.  [JC]    Clinical Course User Index [JC] Juandedios Dudash, Charline Bills, PA-C    Patient's diagnosis is consistent with mycoplasma pneumonia.  Patient has had 3 weeks of symptoms.  She was concerned that she may have strep or flu which both returned negative.  Initial differential included URI versus strep versus bronchitis versus community acquired pneumonia versus influenza.  X-ray was reassuring with no acute consolidations.. Patient will be discharged home with prescriptions for Bromfed cough syrup, Magic mouthwash, Z-Pak.  Patient will also have 1 month of diltiazem refilled at this time.. Patient is to follow up with primary care as needed or otherwise directed. Patient is given ED precautions to return to the ED for any worsening or new symptoms.     ____________________________________________  FINAL CLINICAL IMPRESSION(S) / ED DIAGNOSES  Final diagnoses:  Pneumonia due to Mycoplasma pneumoniae, unspecified laterality, unspecified part of lung  Medication refill      NEW MEDICATIONS STARTED DURING THIS VISIT:  ED Discharge Orders        Ordered    azithromycin (ZITHROMAX Z-PAK) 250 MG tablet     08/08/17 1819    brompheniramine-pseudoephedrine-DM 30-2-10 MG/5ML syrup  4 times daily PRN     08/08/17 1819    magic mouthwash w/lidocaine SOLN  4 times daily    Comments:  Dispense in a 1/1/1/1 ratio. Use lidocaine, diphenhydramine, prednisolone, nystatin   08/08/17 1819    diltiazem (CARDIZEM LA) 120 MG 24 hr tablet  Daily     08/08/17 1819          This chart was dictated using voice recognition software/Dragon. Despite best efforts to proofread, errors can occur which can change the meaning. Any change was purely unintentional.     Darletta Moll, PA-C 08/08/17 1820    Eula Listen, MD 08/08/17 1929

## 2017-08-08 NOTE — ED Triage Notes (Signed)
Presents with cough,body aches and sore throat for about 2 weeks. States she has been using OTC meds with min relief. States pain to throat is worse today

## 2017-10-14 ENCOUNTER — Emergency Department
Admission: EM | Admit: 2017-10-14 | Discharge: 2017-10-15 | Disposition: A | Discharge status: Home / Self Care | Payer: Managed Care, Other (non HMO) | Attending: Emergency Medicine | Admitting: Emergency Medicine

## 2017-10-14 ENCOUNTER — Other Ambulatory Visit: Payer: Self-pay

## 2017-10-14 DIAGNOSIS — M7989 Other specified soft tissue disorders: Secondary | ICD-10-CM

## 2017-10-14 DIAGNOSIS — R2243 Localized swelling, mass and lump, lower limb, bilateral: Secondary | ICD-10-CM | POA: Insufficient documentation

## 2017-10-14 DIAGNOSIS — Z79899 Other long term (current) drug therapy: Secondary | ICD-10-CM | POA: Diagnosis not present

## 2017-10-14 DIAGNOSIS — I129 Hypertensive chronic kidney disease with stage 1 through stage 4 chronic kidney disease, or unspecified chronic kidney disease: Secondary | ICD-10-CM | POA: Diagnosis not present

## 2017-10-14 DIAGNOSIS — R42 Dizziness and giddiness: Secondary | ICD-10-CM | POA: Insufficient documentation

## 2017-10-14 DIAGNOSIS — Z794 Long term (current) use of insulin: Secondary | ICD-10-CM | POA: Insufficient documentation

## 2017-10-14 DIAGNOSIS — E119 Type 2 diabetes mellitus without complications: Secondary | ICD-10-CM | POA: Insufficient documentation

## 2017-10-14 DIAGNOSIS — I10 Essential (primary) hypertension: Secondary | ICD-10-CM

## 2017-10-14 DIAGNOSIS — N189 Chronic kidney disease, unspecified: Secondary | ICD-10-CM

## 2017-10-14 LAB — BASIC METABOLIC PANEL
Anion gap: 8 (ref 5–15)
BUN: 52 mg/dL — ABNORMAL HIGH (ref 6–20)
CO2: 20 mmol/L — ABNORMAL LOW (ref 22–32)
Calcium: 8.9 mg/dL (ref 8.9–10.3)
Chloride: 110 mmol/L (ref 101–111)
Creatinine, Ser: 2.59 mg/dL — ABNORMAL HIGH (ref 0.44–1.00)
GFR calc Af Amer: 22 mL/min — ABNORMAL LOW (ref >60)
GFR calc non Af Amer: 19 mL/min — ABNORMAL LOW (ref >60)
Glucose, Bld: 119 mg/dL — ABNORMAL HIGH (ref 65–99)
Potassium: 4.3 mmol/L (ref 3.5–5.1)
Sodium: 138 mmol/L (ref 135–145)

## 2017-10-14 LAB — CBC
HCT: 34.2 % — ABNORMAL LOW (ref 35.0–47.0)
Hemoglobin: 11.4 g/dL — ABNORMAL LOW (ref 12.0–16.0)
MCH: 29.7 pg (ref 26.0–34.0)
MCHC: 33.3 g/dL (ref 32.0–36.0)
MCV: 89.4 fL (ref 80.0–100.0)
Platelets: 255 10*3/uL (ref 150–440)
RBC: 3.82 MIL/uL (ref 3.80–5.20)
RDW: 13.3 % (ref 11.5–14.5)
WBC: 6.3 10*3/uL (ref 3.6–11.0)

## 2017-10-14 NOTE — ED Triage Notes (Signed)
My blood pressure was 201/109.  Reports being dizzy and have lower leg edema.  Reports started new blood pressure medication 3 weeks ago.

## 2017-10-15 ENCOUNTER — Emergency Department: Payer: Managed Care, Other (non HMO)

## 2017-10-15 NOTE — ED Provider Notes (Signed)
[facility] Emergency Department Provider Note  ____________________________________________   First MD Initiated Contact with Patient 10/15/17 0023     (approximate)  I have reviewed the triage vital signs and the nursing notes.   HISTORY  Chief Complaint Hypertension and Dizziness   HPI Tracy Jennings is a 62 y.o. female who self presents to the emergency department with elevated blood pressure throughout the course of today.  The patient is a long-standing history of hypertension for which she takes losartan, metoprolol, and an unknown third medication.  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  She denies chest pain or shortness of breath.  She sleeps on 2 pillows.  She does not awake at night short of breath.  She was recently diagnosed with chronic kidney disease and referred to nephrology.  Her leg swelling has been gradual onset and slowly progressive.  Seems to be worse throughout the day improved at night.  Past Medical History:  Diagnosis Date  . Diabetes mellitus without complication ([place])   . Hypertension   . Pancreatitis     There are no active problems to display for this patient.   Past Surgical History:  Procedure Laterality Date  . CATARACT EXTRACTION    . CHOLECYSTECTOMY    . EYE SURGERY      Prior to Admission medications   Medication Sig Start Date End Date Taking? Authorizing Provider  amLODipine (NORVASC) 5 MG tablet Take 1 tablet by mouth once daily 01/18/16   [provider]  atorvastatin (LIPITOR) 40 MG tablet Take by mouth. 10/05/15 10/04/16  [provider]  azithromycin (ZITHROMAX Z-PAK) 250 MG tablet Take 2 tablets (500 mg) on  Day 1,  followed by 1 tablet (250 mg) once daily on Days 2 through 5. 08/08/17   Cuthriell,  Roderic Palau D, PA-C  brompheniramine-pseudoephedrine-DM 30-2-10 MG/5ML syrup Take 10 mLs by mouth 4 (four) times daily as needed. 08/08/17   Cuthriell, Charline Bills, PA-C  diltiazem (CARDIZEM LA) 120 MG 24 hr tablet Take 1 tablet (120 mg total) by mouth daily. 08/08/17   Cuthriell, Charline Bills, PA-C  HYDROcodone-acetaminophen (NORCO) 5-325 MG tablet Take 1 tablet by mouth every 4 (four) hours as needed for moderate pain. 08/07/16   Merlyn Lot, MD  insulin aspart (NOVOLOG) 100 UNIT/ML FlexPen Inject 10-12 Units into the skin.  12/28/15   [provider]  Insulin Glargine 300 UNIT/ML SOPN Inject 40 Units into the skin daily.  11/30/15   [provider]  losartan (COZAAR) 100 MG tablet Take 1 tablet by mouth once daily 01/18/16   [provider]  magic mouthwash w/lidocaine SOLN Take 5 mLs by mouth 4 (four) times daily. 08/08/17   Cuthriell, Charline Bills, PA-C  metFORMIN (GLUCOPHAGE) 500 MG tablet Take 1,000 mg by mouth. 12/19/14   [provider]  metoprolol (LOPRESSOR) 50 MG tablet Take 50 mg by mouth. 02/20/15   [provider]  promethazine (PHENERGAN) 12.5 MG tablet Take 1 tablet (12.5 mg total) by mouth every 6 (six) hours as needed for nausea or vomiting. 08/07/16   Merlyn Lot, MD    Allergies Hctz [hydrochlorothiazide] and Sulfa antibiotics  No family history on file.  Social History Social History   Tobacco Use  . Smoking status: Never Smoker  .  Smokeless tobacco: Never Used  Substance Use Topics  . Alcohol use: No  . Drug use: Not on file    Review of Systems Constitutional: No fever/chills Eyes: No visual changes. ENT: No sore throat. Cardiovascular: Denies chest pain. Respiratory: Denies shortness of breath. Gastrointestinal: No abdominal pain.  No nausea, no vomiting.  No diarrhea.  No constipation. Genitourinary: Negative for dysuria. Musculoskeletal: Negative for back pain. Skin: Negative for rash. Neurological: Negative  for headaches, focal weakness or numbness.   ____________________________________________   PHYSICAL EXAM:  VITAL SIGNS: ED Triage Vitals [10/14/17 1917]  Enc Vitals Group     BP (!) 208/87     Pulse Rate (!) 101     Resp 20     Temp 98.5 F (36.9 C)     Temp Source Oral     SpO2 98 %     Weight 179 lb (81.2 kg)     Height 5\' 4"  (1.626 m)     Head Circumference      Peak Flow      Pain Score      Pain Loc      Pain Edu?      Excl. in [place]?     Constitutional: Alert and oriented x4 pleasant cooperative speaks in full clear sentences no diaphoresis Eyes: PERRL EOMI. Head: Atraumatic. Nose: No congestion/rhinnorhea. Mouth/Throat: No trismus Neck: No stridor.   Moderate JVD although able to lie completely flat Cardiovascular: Normal rate, regular rhythm. Grossly normal heart sounds.  Good peripheral circulation. Respiratory: Normal respiratory effort.  No retractions.  Clear to auscultation bilaterally Gastrointestinal: Soft nontender Musculoskeletal: Legs are equal in size 2+ pitting edema to upper shins bilaterally Neurologic:  Normal speech and language. No gross focal neurologic deficits are appreciated. Skin:  Skin is warm, dry and intact. No rash noted. Psychiatric: Mood and affect are normal. Speech and behavior are normal.    ____________________________________________   DIFFERENTIAL includes but not limited to  Congestive heart failure, deep vein thrombosis, pulmonary edema, chronic kidney disease, asymptomatic hypertension ____________________________________________   LABS (all labs ordered are listed, but only abnormal results are displayed)  Labs Reviewed  BASIC METABOLIC PANEL - Abnormal; Notable for the following components:      Result Value   CO2 20 (*)    Glucose, Bld 119 (*)    BUN 52 (*)    Creatinine, Ser 2.59 (*)    GFR calc non Af Amer 19 (*)    GFR calc Af Amer 22 (*)    All other components within normal limits  CBC - Abnormal;  Notable for the following components:   Hemoglobin 11.4 (*)    HCT 34.2 (*)    All other components within normal limits  URINALYSIS, COMPLETE (UACMP) WITH MICROSCOPIC    Lab work reviewed by me shows elevated creatinine slightly worse from 1 week ago __________________________________________  EKG  ED ECG REPORT I, Darel Hong, the attending physician, personally viewed and interpreted this ECG.  Date: 10/15/2017 EKG Time:  Rate: 97 Rhythm: normal sinus rhythm QRS Axis: Leftward axis Intervals: normal ST/T Wave abnormalities: normal Narrative Interpretation: no evidence of acute ischemia  ____________________________________________  RADIOLOGY  Chest x-ray reviewed by me with no acute disease ____________________________________________   PROCEDURES  Procedure(s) performed: no  Procedures  Critical Care performed: no  Observation: no ____________________________________________   INITIAL IMPRESSION / ASSESSMENT AND PLAN / ED COURSE  Pertinent labs & imaging results that were available during my care of the patient were  reviewed by me and considered in my medical decision making (see chart for details).  By the time I saw the patient her blood pressure is in the 160s-180s.  She is currently asymptomatic.  She does have some bilateral lower extremity swelling and some JVD although her lungs are clear and chest x-ray is unremarkable.  She does have a creatinine of 2.59 slightly increased from 2.3 a week ago.  She recently did increase her antihypertensives at home and have encouraged her to continue her home regimen.  I will help her establish care with nephrology as an outpatient this week.  Strict return precautions have been given and the patient verbalizes understanding and agreement with the plan.      ____________________________________________   FINAL CLINICAL IMPRESSION(S) / ED DIAGNOSES  Final diagnoses:  Chronic kidney disease, unspecified CKD  stage  Leg swelling  Hypertension, unspecified type      NEW MEDICATIONS STARTED DURING THIS VISIT:  New Prescriptions   No medications on file     Note:  This document was prepared using Dragon voice recognition software and may include unintentional dictation errors.     Darel Hong, MD 10/15/17 [phone]

## 2017-10-15 NOTE — Discharge Instructions (Signed)
Please continue taking all of your high blood pressure medications at home and follow-up with the kidney specialist this week for reevaluation.  Return to the emergency department sooner for any concerns.  It was a pleasure to take care of you today, and thank you for coming to our emergency department.  If you have any questions or concerns before leaving please ask the nurse to grab me and I'm more than happy to go through your aftercare instructions again.  If you were prescribed any opioid pain medication today such as Norco, Vicodin, Percocet, morphine, hydrocodone, or oxycodone please make sure you do not drive when you are taking this medication as it can alter your ability to drive safely.  If you have any concerns once you are home that you are not improving or are in fact getting worse before you can make it to your follow-up appointment, please do not hesitate to call 911 and come back for further evaluation.  Darel Hong, MD  Results for orders placed or performed during the hospital encounter of 85/63/14  Basic metabolic panel  Result Value Ref Range   Sodium 138 135 - 145 mmol/L   Potassium 4.3 3.5 - 5.1 mmol/L   Chloride 110 101 - 111 mmol/L   CO2 20 (L) 22 - 32 mmol/L   Glucose, Bld 119 (H) 65 - 99 mg/dL   BUN 52 (H) 6 - 20 mg/dL   Creatinine, Ser 2.59 (H) 0.44 - 1.00 mg/dL   Calcium 8.9 8.9 - 10.3 mg/dL   GFR calc non Af Amer 19 (L) >60 mL/min   GFR calc Af Amer 22 (L) >60 mL/min   Anion gap 8 5 - 15  CBC  Result Value Ref Range   WBC 6.3 3.6 - 11.0 K/uL   RBC 3.82 3.80 - 5.20 MIL/uL   Hemoglobin 11.4 (L) 12.0 - 16.0 g/dL   HCT 34.2 (L) 35.0 - 47.0 %   MCV 89.4 80.0 - 100.0 fL   MCH 29.7 26.0 - 34.0 pg   MCHC 33.3 32.0 - 36.0 g/dL   RDW 13.3 11.5 - 14.5 %   Platelets 255 150 - 440 K/uL   Dg Chest 2 View  Result Date: 10/15/2017 CLINICAL DATA:  Bilateral lower extremity edema. EXAM: CHEST  2 VIEW COMPARISON:  Radiographs of August 08, 2017. FINDINGS: The  heart size and mediastinal contours are within normal limits. Both lungs are clear. No pneumothorax or pleural effusion is noted. The visualized skeletal structures are unremarkable. IMPRESSION: No active cardiopulmonary disease. Electronically Signed   By: Marijo Conception, M.D.   On: 10/15/2017 01:29

## 2017-10-15 NOTE — ED Notes (Signed)
Pt updated on delay, lights dimmed for comfort. Pt verbalizes understanding.

## 2017-10-31 ENCOUNTER — Encounter: Payer: Self-pay | Admitting: Emergency Medicine

## 2017-10-31 ENCOUNTER — Other Ambulatory Visit: Payer: Self-pay

## 2017-10-31 ENCOUNTER — Observation Stay
Admission: EM | Admit: 2017-10-31 | Discharge: 2017-11-03 | Disposition: A | Discharge status: Home / Self Care | Payer: Managed Care, Other (non HMO) | Attending: Internal Medicine | Admitting: Internal Medicine

## 2017-10-31 DIAGNOSIS — Z888 Allergy status to other drugs, medicaments and biological substances status: Secondary | ICD-10-CM | POA: Insufficient documentation

## 2017-10-31 DIAGNOSIS — E1143 Type 2 diabetes mellitus with diabetic autonomic (poly)neuropathy: Secondary | ICD-10-CM

## 2017-10-31 DIAGNOSIS — Z9049 Acquired absence of other specified parts of digestive tract: Secondary | ICD-10-CM | POA: Insufficient documentation

## 2017-10-31 DIAGNOSIS — Z882 Allergy status to sulfonamides status: Secondary | ICD-10-CM | POA: Insufficient documentation

## 2017-10-31 DIAGNOSIS — E1122 Type 2 diabetes mellitus with diabetic chronic kidney disease: Secondary | ICD-10-CM | POA: Diagnosis not present

## 2017-10-31 DIAGNOSIS — K861 Other chronic pancreatitis: Secondary | ICD-10-CM | POA: Diagnosis present

## 2017-10-31 DIAGNOSIS — N184 Chronic kidney disease, stage 4 (severe): Secondary | ICD-10-CM | POA: Insufficient documentation

## 2017-10-31 DIAGNOSIS — Z9849 Cataract extraction status, unspecified eye: Secondary | ICD-10-CM | POA: Diagnosis not present

## 2017-10-31 DIAGNOSIS — Z79899 Other long term (current) drug therapy: Secondary | ICD-10-CM | POA: Diagnosis not present

## 2017-10-31 DIAGNOSIS — I129 Hypertensive chronic kidney disease with stage 1 through stage 4 chronic kidney disease, or unspecified chronic kidney disease: Secondary | ICD-10-CM | POA: Diagnosis not present

## 2017-10-31 DIAGNOSIS — Z7982 Long term (current) use of aspirin: Secondary | ICD-10-CM | POA: Diagnosis not present

## 2017-10-31 DIAGNOSIS — N186 End stage renal disease: Secondary | ICD-10-CM | POA: Diagnosis present

## 2017-10-31 DIAGNOSIS — IMO0002 Reserved for concepts with insufficient information to code with codable children: Secondary | ICD-10-CM | POA: Diagnosis present

## 2017-10-31 DIAGNOSIS — K859 Acute pancreatitis without necrosis or infection, unspecified: Principal | ICD-10-CM | POA: Insufficient documentation

## 2017-10-31 DIAGNOSIS — Z794 Long term (current) use of insulin: Secondary | ICD-10-CM | POA: Insufficient documentation

## 2017-10-31 DIAGNOSIS — I10 Essential (primary) hypertension: Secondary | ICD-10-CM | POA: Diagnosis present

## 2017-10-31 DIAGNOSIS — E119 Type 2 diabetes mellitus without complications: Secondary | ICD-10-CM

## 2017-10-31 HISTORY — DX: Chronic kidney disease, unspecified: N18.9

## 2017-10-31 LAB — CBC
HCT: 34.8 % — ABNORMAL LOW (ref 35.0–47.0)
Hemoglobin: 11.6 g/dL — ABNORMAL LOW (ref 12.0–16.0)
MCH: 29.6 pg (ref 26.0–34.0)
MCHC: 33.4 g/dL (ref 32.0–36.0)
MCV: 88.6 fL (ref 80.0–100.0)
Platelets: 303 10*3/uL (ref 150–440)
RBC: 3.93 MIL/uL (ref 3.80–5.20)
RDW: 12.7 % (ref 11.5–14.5)
WBC: 5.8 10*3/uL (ref 3.6–11.0)

## 2017-10-31 LAB — URINALYSIS, COMPLETE (UACMP) WITH MICROSCOPIC
Bacteria, UA: NONE SEEN
Bilirubin Urine: NEGATIVE
Glucose, UA: >=500 mg/dL — AB
Hgb urine dipstick: NEGATIVE
Ketones, ur: NEGATIVE mg/dL
Leukocytes, UA: NEGATIVE
Nitrite: NEGATIVE
Protein, ur: >=300 mg/dL — AB
Specific Gravity, Urine: 1.017 (ref 1.005–1.030)
pH: 6 (ref 5.0–8.0)

## 2017-10-31 LAB — COMPREHENSIVE METABOLIC PANEL
ALT: 16 U/L (ref 14–54)
AST: 17 U/L (ref 15–41)
Albumin: 3.1 g/dL — ABNORMAL LOW (ref 3.5–5.0)
Alkaline Phosphatase: 66 U/L (ref 38–126)
Anion gap: 8 (ref 5–15)
BUN: 22 mg/dL — ABNORMAL HIGH (ref 6–20)
CO2: 25 mmol/L (ref 22–32)
Calcium: 8.7 mg/dL — ABNORMAL LOW (ref 8.9–10.3)
Chloride: 104 mmol/L (ref 101–111)
Creatinine, Ser: 2.18 mg/dL — ABNORMAL HIGH (ref 0.44–1.00)
GFR calc Af Amer: 27 mL/min — ABNORMAL LOW (ref >60)
GFR calc non Af Amer: 24 mL/min — ABNORMAL LOW (ref >60)
Glucose, Bld: 241 mg/dL — ABNORMAL HIGH (ref 65–99)
Potassium: 4.3 mmol/L (ref 3.5–5.1)
Sodium: 137 mmol/L (ref 135–145)
Total Bilirubin: 0.5 mg/dL (ref 0.3–1.2)
Total Protein: 6.9 g/dL (ref 6.5–8.1)

## 2017-10-31 LAB — TROPONIN I: Troponin I: <0.03 ng/mL (ref <0.03)

## 2017-10-31 LAB — LIPASE, BLOOD: Lipase: 134 U/L — ABNORMAL HIGH (ref 11–51)

## 2017-10-31 MED ORDER — HYDROMORPHONE HCL 1 MG/ML IJ SOLN
1.0000 mg | Freq: Once | INTRAMUSCULAR | Status: AC
  Administered 2017-10-31: 1 mg via INTRAVENOUS
  Filled 2017-10-31: qty 1

## 2017-10-31 MED ORDER — ONDANSETRON HCL 4 MG/2ML IJ SOLN
4.0000 mg | Freq: Once | INTRAMUSCULAR | Status: AC
Start: 2017-10-31 — End: 2017-10-31
  Administered 2017-10-31: 4 mg via INTRAVENOUS
  Filled 2017-10-31: qty 2

## 2017-10-31 MED ORDER — SODIUM CHLORIDE 0.9 % IV BOLUS (SEPSIS)
1000.0000 mL | Freq: Once | INTRAVENOUS | Status: AC
  Administered 2017-10-31: 1000 mL via INTRAVENOUS

## 2017-10-31 NOTE — ED Triage Notes (Signed)
Patient ambulatory to triage with steady gait, without difficulty or distress noted; pt reports upper abd pain x 3 days with no accomp symptoms; st hx pancreatitis and feels same

## 2017-10-31 NOTE — ED Notes (Signed)
Helped pt change into gown and gave warm blankets for comfort

## 2017-10-31 NOTE — ED Provider Notes (Addendum)
[facility] Emergency Department Provider Note  ____________________________________________   I have reviewed the triage vital signs and the nursing notes. Where available I have reviewed prior notes and, if possible and indicated, outside hospital notes.    HISTORY  Chief Complaint Abdominal Pain    HPI Tracy Jennings is a 62 y.o. female history of recurrent chronic pancreatitis and uncontrolled hypertension, baseline blood pressures during multiple prior visits here are around 200 over 90s.  Patient states that she is having epigastric abdominal pain and nausea for the last 3 or 4 days.  This exactly like multiple different prior visits for pancreatitis.  Patient states that she has not vomited, but she has had decreased p.o.  She denies any change in her stooling.  She denies any chest pain exertional symptoms or shortness of breath.  The pain is an aching discomfort, it is worse when she eats and for this reason she has not eaten much recently.  She denies any other alleviating or aggravating symptoms.  There is no significant radiation, it is very focally in the epigastric region, is worse when she touches it as well.  She has taken nothing for the pain at home.  She is here for pain medication.  She states she has not been able to take her blood pressure medication for the last 3 or 4 days which is not unusual.    Past Medical History:  Diagnosis Date  . Diabetes mellitus without complication ([place])   . Hypertension   . Pancreatitis     There are no active problems to display for this patient.   Past Surgical History:  Procedure Laterality Date  . CATARACT EXTRACTION    . CHOLECYSTECTOMY    . EYE SURGERY      Prior to Admission medications   Medication Sig Start Date End Date Taking? Authorizing Provider  amLODipine (NORVASC) 5 MG tablet Take 1 tablet by mouth once daily 01/18/16   [provider]  atorvastatin (LIPITOR) 40 MG tablet Take  by mouth. 10/05/15 10/04/16  [provider]  azithromycin (ZITHROMAX Z-PAK) 250 MG tablet Take 2 tablets (500 mg) on  Day 1,  followed by 1 tablet (250 mg) once daily on Days 2 through 5. 08/08/17   Cuthriell, Roderic Palau D, PA-C  brompheniramine-pseudoephedrine-DM 30-2-10 MG/5ML syrup Take 10 mLs by mouth 4 (four) times daily as needed. 08/08/17   Cuthriell, Charline Bills, PA-C  diltiazem (CARDIZEM LA) 120 MG 24 hr tablet Take 1 tablet (120 mg total) by mouth daily. 08/08/17   Cuthriell, Charline Bills, PA-C  HYDROcodone-acetaminophen (NORCO) 5-325 MG tablet Take 1 tablet by mouth every 4 (four) hours as needed for moderate pain. 08/07/16   Merlyn Lot, MD  insulin aspart (NOVOLOG) 100 UNIT/ML FlexPen Inject 10-12 Units into the skin.  12/28/15   [provider]  Insulin Glargine 300 UNIT/ML SOPN Inject 40 Units into the skin daily.  11/30/15   [provider]  losartan (COZAAR) 100 MG tablet Take 1 tablet by mouth once daily 01/18/16   [provider]  magic mouthwash w/lidocaine SOLN Take 5 mLs by mouth 4 (four) times daily. 08/08/17   Cuthriell, Charline Bills, PA-C  metFORMIN (GLUCOPHAGE) 500 MG tablet Take 1,000 mg by mouth. 12/19/14   [provider]  metoprolol (LOPRESSOR) 50 MG tablet Take 50 mg by mouth. 02/20/15   [provider]  promethazine (PHENERGAN) 12.5 MG tablet Take 1 tablet (12.5 mg total) by mouth every 6 (six) hours as  needed for nausea or vomiting. 08/07/16   Merlyn Lot, MD    Allergies Hctz [hydrochlorothiazide] and Sulfa antibiotics  No family history on file.  Social History Social History   Tobacco Use  . Smoking status: Never Smoker  . Smokeless tobacco: Never Used  Substance Use Topics  . Alcohol use: No  . Drug use: Not on file    Review of Systems Constitutional: No fever/chills Eyes: No visual changes. ENT: No sore throat. No stiff neck no neck pain Cardiovascular: Denies chest pain. Respiratory: Denies  shortness of breath. Gastrointestinal:   no vomiting.  No diarrhea.  No constipation. Genitourinary: Negative for dysuria. Musculoskeletal: Negative lower extremity swelling Skin: Negative for rash. Neurological: Negative for severe headaches, focal weakness or numbness.   ____________________________________________   PHYSICAL EXAM:  VITAL SIGNS: ED Triage Vitals  Enc Vitals Group     BP 10/31/17 2004 (!) 200/93     Pulse Rate 10/31/17 2004 (!) 105     Resp 10/31/17 2004 20     Temp 10/31/17 2004 98.4 F (36.9 C)     Temp Source 10/31/17 2004 Oral     SpO2 10/31/17 2004 97 %     Weight 10/31/17 2003 179 lb (81.2 kg)     Height 10/31/17 2003 5\' 4"  (1.626 m)     Head Circumference --      Peak Flow --      Pain Score 10/31/17 2003 7     Pain Loc --      Pain Edu? --      Excl. in [place]? --     Constitutional: Alert and oriented. Well appearing and in no acute distress. Eyes: Conjunctivae are normal Head: Atraumatic HEENT: No congestion/rhinnorhea. Mucous membranes are moist.  Oropharynx non-erythematous Neck:   Nontender with no meningismus, no masses, no stridor Cardiovascular: Normal rate, regular rhythm. Grossly normal heart sounds.  Good peripheral circulation. Respiratory: Normal respiratory effort.  No retractions. Lungs CTAB. Abdominal: Soft and positive epigastric nonsurgical abdominal tenderness. No distention. No guarding no rebound Back:  There is no focal tenderness or step off.  there is no midline tenderness there are no lesions noted. there is no CVA tenderness Musculoskeletal: No lower extremity tenderness, no upper extremity tenderness. No joint effusions, no DVT signs strong distal pulses no edema Neurologic:  Normal speech and language. No gross focal neurologic deficits are appreciated.  Skin:  Skin is warm, dry and intact. No rash noted. Psychiatric: Mood and affect are normal. Speech and behavior are  normal.  ____________________________________________   LABS (all labs ordered are listed, but only abnormal results are displayed)  Labs Reviewed  LIPASE, BLOOD - Abnormal; Notable for the following components:      Result Value   Lipase 134 (*)    All other components within normal limits  COMPREHENSIVE METABOLIC PANEL - Abnormal; Notable for the following components:   Glucose, Bld 241 (*)    BUN 22 (*)    Creatinine, Ser 2.18 (*)    Calcium 8.7 (*)    Albumin 3.1 (*)    GFR calc non Af Amer 24 (*)    GFR calc Af Amer 27 (*)    All other components within normal limits  CBC - Abnormal; Notable for the following components:   Hemoglobin 11.6 (*)    HCT 34.8 (*)    All other components within normal limits  URINALYSIS, COMPLETE (UACMP) WITH MICROSCOPIC - Abnormal; Notable for the following components:   Color, Urine  YELLOW (*)    APPearance HAZY (*)    Glucose, UA >=500 (*)    Protein, ur >=300 (*)    Squamous Epithelial / LPF 0-5 (*)    All other components within normal limits  TROPONIN I    Pertinent labs  results that were available during my care of the patient were reviewed by me and considered in my medical decision making (see chart for details). ____________________________________________  EKG  I personally interpreted any EKGs ordered by me or triage Sinus rhythm rate 96 bpm no acute ST elevation or depression, normal axis unremarkable EKG ____________________________________________  RADIOLOGY  Pertinent labs & imaging results that were available during my care of the patient were reviewed by me and considered in my medical decision making (see chart for details). If possible, patient and/or family made aware of any abnormal findings.  No results found. ____________________________________________    PROCEDURES  Procedure(s) performed: None  Procedures  Critical Care performed: None  ____________________________________________   INITIAL  IMPRESSION / ASSESSMENT AND PLAN / ED COURSE  Pertinent labs & imaging results that were available during my care of the patient were reviewed by me and considered in my medical decision making (see chart for details).  Patient here with recurrent chronic epigastric abdominal pain which is reproducible, she has had imaging for this before.  Lipase is marginally elevated which I think is the cause of her symptoms.  Certainly nothing to suggest anything beyond the usual for her.  Her preference would be to go home she feels better we are giving her pain medication, IV fluid and we will reassess.  At this time, there does not appear to be clinical evidence to support the diagnosis of pulmonary embolus, dissection, myocarditis, endocarditis, pericarditis, pericardial tamponade, acute coronary syndrome, pneumothorax, pneumonia, or any other acute intrathoracic pathology that will require admission or acute intervention. Nor is there evidence of any significant intra-abdominal pathology causing this discomfort.  Considering the patient's symptoms, medical history, and physical examination today, I have low suspicion for cholecystitis or biliary pathology, pancreatic pseudocyst, perforation or bowel obstruction, hernia, intra-abdominal abscess, AAA or dissection, volvulus or intussusception, mesenteric ischemia, ischemic gut, pyelonephritis or appendicitis.   ----------------------------------------- 11:52 PM on 10/31/2017 -----------------------------------------  bp trending down with pain. Pt feeling decreased pain, still some nausea no vomiting. Signed out at the end of my shift to Dr. Mable Paris.     ____________________________________________   FINAL CLINICAL IMPRESSION(S) / ED DIAGNOSES  Final diagnoses:  None      This chart was dictated using voice recognition software.  Despite best efforts to proofread,  errors can occur which can change meaning.      Schuyler Amor, MD 10/31/17  2337    Schuyler Amor, MD 10/31/17 2352

## 2017-11-01 ENCOUNTER — Other Ambulatory Visit: Payer: Self-pay

## 2017-11-01 ENCOUNTER — Encounter: Payer: Self-pay | Admitting: Internal Medicine

## 2017-11-01 DIAGNOSIS — K859 Acute pancreatitis without necrosis or infection, unspecified: Secondary | ICD-10-CM | POA: Diagnosis present

## 2017-11-01 DIAGNOSIS — K861 Other chronic pancreatitis: Secondary | ICD-10-CM | POA: Diagnosis present

## 2017-11-01 DIAGNOSIS — N186 End stage renal disease: Secondary | ICD-10-CM | POA: Diagnosis present

## 2017-11-01 DIAGNOSIS — E1143 Type 2 diabetes mellitus with diabetic autonomic (poly)neuropathy: Secondary | ICD-10-CM

## 2017-11-01 DIAGNOSIS — E119 Type 2 diabetes mellitus without complications: Secondary | ICD-10-CM

## 2017-11-01 DIAGNOSIS — I10 Essential (primary) hypertension: Secondary | ICD-10-CM | POA: Diagnosis present

## 2017-11-01 LAB — BASIC METABOLIC PANEL
Anion gap: 9 (ref 5–15)
BUN: 22 mg/dL — ABNORMAL HIGH (ref 6–20)
CO2: 23 mmol/L (ref 22–32)
Calcium: 8.4 mg/dL — ABNORMAL LOW (ref 8.9–10.3)
Chloride: 106 mmol/L (ref 101–111)
Creatinine, Ser: 2.27 mg/dL — ABNORMAL HIGH (ref 0.44–1.00)
GFR calc Af Amer: 26 mL/min — ABNORMAL LOW (ref >60)
GFR calc non Af Amer: 22 mL/min — ABNORMAL LOW (ref >60)
Glucose, Bld: 257 mg/dL — ABNORMAL HIGH (ref 65–99)
Potassium: 4.3 mmol/L (ref 3.5–5.1)
Sodium: 138 mmol/L (ref 135–145)

## 2017-11-01 LAB — CBC
HCT: 32.7 % — ABNORMAL LOW (ref 35.0–47.0)
Hemoglobin: 10.6 g/dL — ABNORMAL LOW (ref 12.0–16.0)
MCH: 28.9 pg (ref 26.0–34.0)
MCHC: 32.4 g/dL (ref 32.0–36.0)
MCV: 89.2 fL (ref 80.0–100.0)
Platelets: 304 10*3/uL (ref 150–440)
RBC: 3.67 MIL/uL — ABNORMAL LOW (ref 3.80–5.20)
RDW: 12.8 % (ref 11.5–14.5)
WBC: 6.7 10*3/uL (ref 3.6–11.0)

## 2017-11-01 LAB — GLUCOSE, CAPILLARY
Glucose-Capillary: 273 mg/dL — ABNORMAL HIGH (ref 65–99)
Glucose-Capillary: 161 mg/dL — ABNORMAL HIGH (ref 65–99)
Glucose-Capillary: 142 mg/dL — ABNORMAL HIGH (ref 65–99)

## 2017-11-01 LAB — LIPASE, BLOOD: Lipase: 99 U/L — ABNORMAL HIGH (ref 11–51)

## 2017-11-01 MED ORDER — ASPIRIN EC 81 MG PO TBEC
81.0000 mg | DELAYED_RELEASE_TABLET | Freq: Every day | ORAL | Status: DC
  Administered 2017-11-02: 81 mg via ORAL
  Filled 2017-11-01: qty 1

## 2017-11-01 MED ORDER — HYDRALAZINE HCL 20 MG/ML IJ SOLN: 10.0000 mg | INTRAMUSCULAR | Status: DC | PRN

## 2017-11-01 MED ORDER — OXYCODONE HCL 5 MG PO TABS
5.0000 mg | ORAL_TABLET | ORAL | Status: DC | PRN
  Administered 2017-11-01 – 2017-11-02 (×2): 5 mg via ORAL
  Filled 2017-11-01 (×2): qty 1

## 2017-11-01 MED ORDER — INSULIN ASPART 100 UNIT/ML ~~LOC~~ SOLN
0.0000 [IU] | Freq: Four times a day (QID) | SUBCUTANEOUS | Status: DC
  Administered 2017-11-01: 1 [IU] via SUBCUTANEOUS
  Administered 2017-11-01: 2 [IU] via SUBCUTANEOUS
  Administered 2017-11-01: 5 [IU] via SUBCUTANEOUS
  Administered 2017-11-02: 1 [IU] via SUBCUTANEOUS
  Administered 2017-11-02: 2 [IU] via SUBCUTANEOUS
  Filled 2017-11-01 (×5): qty 1

## 2017-11-01 MED ORDER — AMLODIPINE BESYLATE 5 MG PO TABS
5.0000 mg | ORAL_TABLET | Freq: Every day | ORAL | Status: DC
  Filled 2017-11-01: qty 1

## 2017-11-01 MED ORDER — LOSARTAN POTASSIUM 50 MG PO TABS
100.0000 mg | ORAL_TABLET | Freq: Every day | ORAL | Status: DC
  Administered 2017-11-02: 100 mg via ORAL
  Filled 2017-11-01: qty 2

## 2017-11-01 MED ORDER — ACETAMINOPHEN 650 MG RE SUPP: 650.0000 mg | Freq: Four times a day (QID) | RECTAL | Status: DC | PRN

## 2017-11-01 MED ORDER — ACETAMINOPHEN 325 MG PO TABS
650.0000 mg | ORAL_TABLET | Freq: Four times a day (QID) | ORAL | Status: DC | PRN
Start: 2017-11-01 — End: 2017-11-03

## 2017-11-01 MED ORDER — ONDANSETRON 4 MG PO TBDP
4.0000 mg | ORAL_TABLET | Freq: Once | ORAL | Status: AC
  Administered 2017-11-01: 4 mg via ORAL
  Filled 2017-11-01: qty 1

## 2017-11-01 MED ORDER — LABETALOL HCL 5 MG/ML IV SOLN
10.0000 mg | INTRAVENOUS | Status: DC | PRN
  Administered 2017-11-01: 10 mg via INTRAVENOUS
  Filled 2017-11-01: qty 4

## 2017-11-01 MED ORDER — METOPROLOL TARTRATE 50 MG PO TABS
50.0000 mg | ORAL_TABLET | Freq: Every day | ORAL | Status: DC
  Administered 2017-11-02: 50 mg via ORAL
  Filled 2017-11-01: qty 1

## 2017-11-01 MED ORDER — DILTIAZEM HCL ER COATED BEADS 120 MG PO CP24
120.0000 mg | ORAL_CAPSULE | Freq: Every day | ORAL | Status: DC
  Administered 2017-11-02: 120 mg via ORAL
  Filled 2017-11-01: qty 1

## 2017-11-01 MED ORDER — ONDANSETRON HCL 4 MG PO TABS
4.0000 mg | ORAL_TABLET | Freq: Four times a day (QID) | ORAL | Status: DC | PRN
  Administered 2017-11-01: 4 mg via ORAL
  Filled 2017-11-01: qty 1

## 2017-11-01 MED ORDER — HYDRALAZINE HCL 50 MG PO TABS
100.0000 mg | ORAL_TABLET | Freq: Two times a day (BID) | ORAL | Status: DC
  Administered 2017-11-01 – 2017-11-02 (×3): 100 mg via ORAL
  Filled 2017-11-01 (×4): qty 2

## 2017-11-01 MED ORDER — ONDANSETRON HCL 4 MG/2ML IJ SOLN
4.0000 mg | Freq: Four times a day (QID) | INTRAMUSCULAR | Status: DC | PRN
  Administered 2017-11-01: 4 mg via INTRAVENOUS
  Filled 2017-11-01: qty 2

## 2017-11-01 MED ORDER — MORPHINE SULFATE (PF) 2 MG/ML IV SOLN
2.0000 mg | INTRAVENOUS | Status: DC | PRN
  Administered 2017-11-01: 2 mg via INTRAVENOUS
  Filled 2017-11-01: qty 1

## 2017-11-01 MED ORDER — SPIRONOLACTONE 25 MG PO TABS
50.0000 mg | ORAL_TABLET | Freq: Every day | ORAL | Status: DC
  Filled 2017-11-01: qty 2

## 2017-11-01 MED ORDER — SODIUM CHLORIDE 0.9 % IV SOLN
INTRAVENOUS | Status: AC
  Administered 2017-11-01: 04:00:00 via INTRAVENOUS

## 2017-11-01 MED ORDER — HEPARIN SODIUM (PORCINE) 5000 UNIT/ML IJ SOLN
5000.0000 [IU] | Freq: Three times a day (TID) | INTRAMUSCULAR | Status: DC
  Administered 2017-11-01 – 2017-11-03 (×7): 5000 [IU] via SUBCUTANEOUS
  Filled 2017-11-01 (×7): qty 1

## 2017-11-01 MED ORDER — HYDROCODONE-ACETAMINOPHEN 5-325 MG PO TABS
2.0000 | ORAL_TABLET | Freq: Once | ORAL | Status: AC
  Administered 2017-11-01: 2 via ORAL
  Filled 2017-11-01: qty 2

## 2017-11-01 NOTE — H&P (Signed)
[facility] [place] NAME: Tracy Jennings    MR#:  [id number]  DATE OF BIRTH:  [birth year]  DATE OF ADMISSION:  10/31/2017  PRIMARY CARE PHYSICIAN: [facility]   REQUESTING/REFERRING PHYSICIAN: Rifenbark, MD  CHIEF COMPLAINT:   Chief Complaint  Patient presents with  . Abdominal Pain    HISTORY OF PRESENT ILLNESS:  Tracy Jennings  is a 62 y.o. female who presents with 4 days of progressive abdominal pain.  Patient states that she has had pancreatitis before.  She states this feels the same.  Here in the ED she is found to have an elevated lipase consistent with acute recurrent pancreatitis.  Hospitalist were called for admission  PAST MEDICAL HISTORY:   Past Medical History:  Diagnosis Date  . CKD (chronic kidney disease)   . Diabetes mellitus without complication ([place])   . Hypertension   . Pancreatitis     PAST SURGICAL HISTORY:   Past Surgical History:  Procedure Laterality Date  . CATARACT EXTRACTION    . CHOLECYSTECTOMY    . EYE SURGERY      SOCIAL HISTORY:   Social History   Tobacco Use  . Smoking status: Never Smoker  . Smokeless tobacco: Never Used  Substance Use Topics  . Alcohol use: No    FAMILY HISTORY:  No family history on file.  DRUG ALLERGIES:   Allergies  Allergen Reactions  . Hctz [Hydrochlorothiazide]     pancreatitis  . Sulfa Antibiotics Rash  . Amlodipine Swelling    MEDICATIONS AT HOME:   Prior to Admission medications   Medication Sig Start Date End Date Taking? Authorizing Provider  aspirin EC 81 MG tablet Take 81 mg by mouth daily. 09/27/17  Yes [provider]  diltiazem (CARDIZEM LA) 120 MG 24 hr tablet Take 1 tablet (120 mg total) by mouth daily. 08/08/17  Yes Cuthriell, Charline Bills, PA-C  hydrALAZINE (APRESOLINE) 100 MG tablet Take 100 mg by mouth 2 (two) times daily. 10/24/17  Yes [provider]  insulin aspart (NOVOLOG) 100 UNIT/ML  FlexPen Inject 10-12 Units into the skin 3 (three) times daily with meals.  12/28/15  Yes [provider]  Insulin Glargine 300 UNIT/ML SOPN Inject 40 Units into the skin daily.  11/30/15  Yes [provider]  losartan (COZAAR) 100 MG tablet Take 1 tablet by mouth once daily 01/18/16  Yes [provider]  metoprolol (LOPRESSOR) 50 MG tablet Take 50 mg by mouth daily.  02/20/15  Yes [provider]  spironolactone (ALDACTONE) 50 MG tablet Take 50 mg by mouth daily. 09/27/17  Yes [provider]  amLODipine (NORVASC) 5 MG tablet Take 1 tablet by mouth once daily 01/18/16   [provider]  atorvastatin (LIPITOR) 40 MG tablet Take 40 mg by mouth daily at 6 PM.  10/05/15 10/04/16  [provider]  metFORMIN (GLUCOPHAGE) 500 MG tablet Take 1,000 mg by mouth. 12/19/14   [provider]    REVIEW OF SYSTEMS:  Review of Systems  Constitutional: Negative for chills, fever, malaise/fatigue and weight loss.  HENT: Negative for ear pain, hearing loss and tinnitus.   Eyes: Negative for blurred vision, double vision, pain and redness.  Respiratory: Negative for cough, hemoptysis and shortness of breath.   Cardiovascular: Negative for chest pain, palpitations, orthopnea and leg swelling.  Gastrointestinal: Positive for abdominal pain, nausea and vomiting. Negative for constipation and diarrhea.  Genitourinary: Negative for dysuria, frequency and hematuria.  Musculoskeletal: Negative for back pain, joint pain and neck pain.  Skin:       No acne, rash, or lesions  Neurological: Negative for dizziness, tremors, focal weakness and weakness.  Endo/Heme/Allergies: Negative for polydipsia. Does not bruise/bleed easily.  Psychiatric/Behavioral: Negative for depression. The patient is not nervous/anxious and does not have insomnia.      VITAL SIGNS:   Vitals:   10/31/17 2141 10/31/17 2200 10/31/17 2300 11/01/17 0000  BP: (!) 213/92 (!) 204/102 (!)  194/88 (!) 184/91  Pulse: 97 96 97 97  Resp: 16  16 16   Temp: 98.6 F (37 C)     TempSrc: Oral     SpO2: 98% 99% 93% 97%  Weight:      Height:       Wt Readings from Last 3 Encounters:  10/31/17 81.2 kg (179 lb)  10/14/17 81.2 kg (179 lb)  08/08/17 85.7 kg (189 lb)    PHYSICAL EXAMINATION:  Physical Exam  Vitals reviewed. Constitutional: She is oriented to person, place, and time. She appears well-developed and well-nourished. No distress.  HENT:  Head: Normocephalic and atraumatic.  Mouth/Throat: Oropharynx is clear and moist.  Eyes: Conjunctivae and EOM are normal. Pupils are equal, round, and reactive to light. No scleral icterus.  Neck: Normal range of motion. Neck supple. No JVD present. No thyromegaly present.  Cardiovascular: Normal rate, regular rhythm and intact distal pulses. Exam reveals no gallop and no friction rub.  No murmur heard. Respiratory: Effort normal and breath sounds normal. No respiratory distress. She has no wheezes. She has no rales.  GI: Soft. Bowel sounds are normal. She exhibits no distension. There is tenderness.  Musculoskeletal: Normal range of motion. She exhibits no edema.  No arthritis, no gout  Lymphadenopathy:    She has no cervical adenopathy.  Neurological: She is alert and oriented to person, place, and time. No cranial nerve deficit.  No dysarthria, no aphasia  Skin: Skin is warm and dry. No rash noted. No erythema.  Psychiatric: She has a normal mood and affect. Her behavior is normal. Judgment and thought content normal.    LABORATORY PANEL:   CBC Recent Labs  Lab 10/31/17 1959  WBC 5.8  HGB 11.6*  HCT 34.8*  PLT 303   ------------------------------------------------------------------------------------------------------------------  Chemistries  Recent Labs  Lab 10/31/17 1959  NA 137  K 4.3  CL 104  CO2 25  GLUCOSE 241*  BUN 22*  CREATININE 2.18*  CALCIUM 8.7*  AST 17  ALT 16  ALKPHOS 66  BILITOT 0.5    ------------------------------------------------------------------------------------------------------------------  Cardiac Enzymes Recent Labs  Lab 10/31/17 1959  TROPONINI <0.03   ------------------------------------------------------------------------------------------------------------------  RADIOLOGY:  No results found.  EKG:   Orders placed or performed during the hospital encounter of 10/31/17  . ED EKG  . ED EKG    IMPRESSION AND PLAN:  Principal Problem:   Pancreatitis, recurrent ([place]) -keep patient n.p.o., gentle IV fluids for now, PRN analgesia and antiemetics Active Problems:   Diabetes ([place]) -sliding scale insulin with corresponding glucose checks   HTN (hypertension) -continue home meds   CKD (chronic kidney disease), stage IV ([facility]) -stable, monitor and avoid nephrotoxins  All the records are reviewed and case discussed with ED provider. Management plans discussed with the patient and/or family.  DVT PROPHYLAXIS: SubQ heparin  GI PROPHYLAXIS: None  ADMISSION STATUS: Observation  CODE STATUS: Full Code Status History    This patient does not have a recorded code status. Please follow your organizational policy  for patients in this situation.      TOTAL TIME TAKING CARE OF THIS PATIENT: 40 minutes.   Jenipher Havel [place] 11/01/2017, 2:07 AM  [facility]  Office  [phone]  CC: Primary care physician; [facility]  Note:  This document was prepared using Dragon voice recognition software and may include unintentional dictation errors.

## 2017-11-01 NOTE — ED Notes (Signed)
Pt vomited right after taking the Vicodin.

## 2017-11-01 NOTE — Progress Notes (Signed)
Inpatient Diabetes Program Recommendations  AACE/ADA: New Consensus Statement on Inpatient Glycemic Control (2015)  Target Ranges:  Prepandial:   less than 140 mg/dL      Peak postprandial:   less than 180 mg/dL (1-2 hours)      Critically ill patients:  140 - 180 mg/dL    Results for Tracy Jennings, Tracy Jennings (MRN [id number]) as of 11/01/2017 10:24  Ref. Range 11/01/2017 05:33  Glucose-Capillary Latest Ref Range: 65 - 99 mg/dL 273 (H)      Home DM Meds: Lantus 40 units daily        Novolog 10-12 units TID        Metformin 1000 mg daily  Current Orders: Novolog Sensitive Correction Scale/ SSI (0-9 units) Q6 hours       MD- Please consider the following in-hospital insulin adjustments:  1. Change Novolog SSI to Q4 hour coverage while NPO (currently ordered Q6 hours)  2. Start Lantus 20 units daily (50% total home dose)     --Will follow patient during hospitalization--  Wyn Quaker RN, MSN, CDE Diabetes Coordinator Inpatient Glycemic Control Team Team Pager: [phone] (8a-5p)

## 2017-11-01 NOTE — ED Provider Notes (Signed)
The patient failed her p.o. challenge.  At this point she requires inpatient admission for IV hydration, IV opiates, and IV antiemetics.  I discussed with the patient who verbalized understanding and agreement the plan.  I then discussed with the hospitalist who is graciously agreed to admit the patient to his service.   Darel Hong, MD 11/01/17 0120

## 2017-11-01 NOTE — Progress Notes (Signed)
RN notified Dr. Jannifer Franklin of patient on NPO and having N/V. Per him is okay for RN to hold on to hydralazine and labetalol.

## 2017-11-01 NOTE — Progress Notes (Signed)
Admitted for acute pancreatitis, lipase coming down slowly, still has abdominal pain, nausea.  #1 recurrent pancreatitis: Continue n.p.o., IV fluids, lipase number coming down so we will start clear liquids as appropriate. 2.  Essential hypertension 3.  Diabetes mellitus type 2 4.  CKD stage IV Time spent 30 minutes

## 2017-11-02 LAB — HIV ANTIBODY (ROUTINE TESTING W REFLEX): HIV Screen 4th Generation wRfx: NONREACTIVE

## 2017-11-02 LAB — GLUCOSE, CAPILLARY
Glucose-Capillary: 153 mg/dL — ABNORMAL HIGH (ref 65–99)
Glucose-Capillary: 144 mg/dL — ABNORMAL HIGH (ref 65–99)
Glucose-Capillary: 219 mg/dL — ABNORMAL HIGH (ref 65–99)
Glucose-Capillary: 204 mg/dL — ABNORMAL HIGH (ref 65–99)
Glucose-Capillary: 171 mg/dL — ABNORMAL HIGH (ref 65–99)

## 2017-11-02 LAB — LIPASE, BLOOD: Lipase: 60 U/L — ABNORMAL HIGH (ref 11–51)

## 2017-11-02 MED ORDER — INSULIN ASPART 100 UNIT/ML ~~LOC~~ SOLN
0.0000 [IU] | Freq: Every day | SUBCUTANEOUS | Status: DC
Start: 2017-11-02 — End: 2017-11-03
  Filled 2017-11-02: qty 1

## 2017-11-02 MED ORDER — INSULIN ASPART 100 UNIT/ML ~~LOC~~ SOLN
3.0000 [IU] | Freq: Once | SUBCUTANEOUS | Status: AC
  Administered 2017-11-02: 3 [IU] via SUBCUTANEOUS
  Filled 2017-11-02: qty 1

## 2017-11-02 MED ORDER — SODIUM CHLORIDE 0.9 % IV SOLN
INTRAVENOUS | Status: DC
  Administered 2017-11-02: 09:00:00 via INTRAVENOUS

## 2017-11-02 MED ORDER — INSULIN ASPART 100 UNIT/ML ~~LOC~~ SOLN
0.0000 [IU] | Freq: Three times a day (TID) | SUBCUTANEOUS | Status: DC
Start: 2017-11-02 — End: 2017-11-03
  Administered 2017-11-02 – 2017-11-03 (×2): 3 [IU] via SUBCUTANEOUS
  Administered 2017-11-03: 2 [IU] via SUBCUTANEOUS
  Filled 2017-11-02 (×3): qty 1

## 2017-11-02 NOTE — Progress Notes (Signed)
[facility] [place] NAME: Tracy Jennings    MR#:  [id number]  DATE OF BIRTH:  Aug 09, 1956  SUBJECTIVE: Admitted for acute pancreatitis: Lipase coming down, patient feeling better better than yesterday.  Less abdominal pain.  Has some nausea but eager to try soft food.  CHIEF COMPLAINT:   Chief Complaint  Patient presents with  . Abdominal Pain    REVIEW OF SYSTEMS:   ROS CONSTITUTIONAL: No fever, fatigue or weakness.  EYES: No blurred or double vision.  EARS, NOSE, AND THROAT: No tinnitus or ear pain.  RESPIRATORY: No cough, shortness of breath, wheezing or hemoptysis.  CARDIOVASCULAR: No chest pain, orthopnea, edema.  GASTROINTESTINAL: nausea, vomiting, diarrhea or abdominal pain.  GENITOURINARY: No dysuria, hematuria.  ENDOCRINE: No polyuria, nocturia,  HEMATOLOGY: No anemia, easy bruising or bleeding SKIN: No rash or lesion. MUSCULOSKELETAL: No joint pain or arthritis.   NEUROLOGIC: No tingling, numbness, weakness.  PSYCHIATRY: No anxiety or depression.   DRUG ALLERGIES:   Allergies  Allergen Reactions  . Hctz [Hydrochlorothiazide]     pancreatitis  . Sulfa Antibiotics Rash  . Amlodipine Swelling  . Amlodipine Besylate Swelling    VITALS:  Blood pressure (!) 166/80, pulse 90, temperature 98.3 F (36.8 C), temperature source Oral, resp. rate 18, height 5\' 4"  (1.626 m), weight 81.2 kg (179 lb), SpO2 97 %.  PHYSICAL EXAMINATION:  GENERAL:  62 y.o.-year-old patient lying in the bed with no acute distress.  EYES: Pupils equal, round, reactive to light . No scleral icterus. Extraocular muscles intact.  HEENT: Head atraumatic, normocephalic. Oropharynx and nasopharynx clear.  NECK:  Supple, no jugular venous distention. No thyroid enlargement, no tenderness.  LUNGS: Normal breath sounds bilaterally, no wheezing, rales,rhonchi or crepitation. No use of accessory muscles of respiration.  CARDIOVASCULAR: S1, S2 normal. No  murmurs, rubs, or gallops.  ABDOMEN: Soft, nontender, nondistended. Bowel sounds present. No organomegaly or mass.  EXTREMITIES: No pedal edema, cyanosis, or clubbing.  NEUROLOGIC: Cranial nerves II through XII are intact. Muscle strength 5/5 in all extremities. Sensation intact. Gait not checked.  PSYCHIATRIC: The patient is alert and oriented x 3.  SKIN: No obvious rash, lesion, or ulcer.    LABORATORY PANEL:   CBC Recent Labs  Lab 11/01/17 0454  WBC 6.7  HGB 10.6*  HCT 32.7*  PLT 304   ------------------------------------------------------------------------------------------------------------------  Chemistries  Recent Labs  Lab 10/31/17 1959 11/01/17 0454  NA 137 138  K 4.3 4.3  CL 104 106  CO2 25 23  GLUCOSE 241* 257*  BUN 22* 22*  CREATININE 2.18* 2.27*  CALCIUM 8.7* 8.4*  AST 17  --   ALT 16  --   ALKPHOS 66  --   BILITOT 0.5  --    ------------------------------------------------------------------------------------------------------------------  Cardiac Enzymes Recent Labs  Lab 10/31/17 1959  TROPONINI <0.03   ------------------------------------------------------------------------------------------------------------------  RADIOLOGY:  No results found.  EKG:   Orders placed or performed during the hospital encounter of 10/31/17  . ED EKG  . ED EKG  . EKG    ASSESSMENT AND PLAN:   Pancreatitis patient ;has a history of recurrent pancreatitis: Lipase is coming down, continue to monitor and advance diet to soft diet, nausea, likely discharge.    2 essential hypertension: Uncontrolled: BP was 200/90 when she came.  Unable to keep medicines down because of nausea at home.  Continue present BP medicines. 3.  Diabetes mellitus type 2:  resume Lantus at a lower dose.  Chronic kidney disease stage IV stable.   All the records are reviewed and case discussed with Care Management/Social Workerr. Management plans discussed with the patient, family  and they are in agreement.  CODE STATUS: full  TOTAL TIME TAKING CARE OF THIS PATIENT: 15minutes.   POSSIBLE D/C IN 1-2 DAYS, DEPENDING ON CLINICAL CONDITION.   Epifanio Lesches M.D on 11/02/2017 at 11:42 AM  Between 7am to 6pm - Pager - [phone]  After 6pm go to [email or web address] - password [email or web address] [facility]  Office  [phone]  CC: Primary care physician; [facility]   Note: This dictation was prepared with Dragon dictation along with smaller phrase technology. Any transcriptional errors that result from this process are unintentional.

## 2017-11-03 LAB — GLUCOSE, CAPILLARY
Glucose-Capillary: 199 mg/dL — ABNORMAL HIGH (ref 65–99)
Glucose-Capillary: 181 mg/dL — ABNORMAL HIGH (ref 65–99)
Glucose-Capillary: 190 mg/dL — ABNORMAL HIGH (ref 65–99)
Glucose-Capillary: 206 mg/dL — ABNORMAL HIGH (ref 65–99)

## 2017-11-03 MED ORDER — OXYCODONE HCL 5 MG PO TABS: 5.0000 mg | ORAL_TABLET | ORAL | 0 refills | Status: DC | PRN

## 2017-11-03 MED ORDER — ONDANSETRON HCL 4 MG PO TABS: 4.0000 mg | ORAL_TABLET | Freq: Four times a day (QID) | ORAL | 0 refills | Status: DC | PRN

## 2017-11-03 NOTE — Discharge Summary (Signed)
Tracy Jennings, is a 62 y.o. female  DOB [birth year]  MRN [id number].  Admission date:  10/31/2017  Admitting Physician  Lance Coon, MD  Discharge Date:  11/03/2017   Primary MD  [facility]  Recommendations for primary care physician for things to follow:   Follow with PCP in 1 week  Admission Diagnosis  Chronic pancreatitis, unspecified pancreatitis type ([place]) [K86.1]   Discharge Diagnosis  Chronic pancreatitis, unspecified pancreatitis type ([place]) [K86.1]    Principal Problem:   Pancreatitis, recurrent ([place]) Active Problems:   Diabetes ([place])   HTN (hypertension)   CKD (chronic kidney disease), stage IV ([place])   Recurrent pancreatitis ([place])      Past Medical History:  Diagnosis Date  . CKD (chronic kidney disease)   . Diabetes mellitus without complication ([place])   . Hypertension   . Pancreatitis     Past Surgical History:  Procedure Laterality Date  . CATARACT EXTRACTION    . CHOLECYSTECTOMY    . EYE SURGERY         History of present illness and  Hospital Course:     Kindly see H&P for history of present illness and admission details, please review complete Labs, Consult reports and Test reports for all details in brief  HPI  from the history and physical done on the day of admission 62 year old female patient with chronic pancreatitis history admitted for acute pancreatitis with abdominal pain, nausea, vomiting.Marland Kitchen   Hospital Course  #1 acute on chronic pancreatitis: lipase  134 on admission decreased to 60 with conservative management.  Received IV fluids, IV pain medicines, IV nausea medicines.  She is feeling better now, no abdominal pain.  Tolerating the diet.  And wants to go home.  We are giving couple of more days of Zofran and also oxycodone just in case if she needs it. 2.   Diabetes mellitus type 2: Patient stopped taking metformin due to kidney disease, continue her home dose Lantus. 3.  CKD stage IV: Baseline creatinine 2.27, patient follows up with PCP.  Avoid nephrotoxic agents. 4.  Essential hypertension: Controlled, continue Norvasc, diltiazem, hydralazine.  Uncontrolled when she came because of nausea, vomiting and not able to take home blood pressure medications.  BP now better, and she came BP was around  200/100.   Discharge Condition: stablke   Follow UP  Follow-up Information    [place], [facility]. Schedule an appointment as soon as possible for a visit in 1 week(s).   Contact information: [place] [address] [phone]             Discharge Instructions  and  Discharge Medications      Allergies as of 11/03/2017      Reactions   Hctz [hydrochlorothiazide]    pancreatitis   Sulfa Antibiotics Rash   Amlodipine Swelling   Amlodipine Besylate Swelling      Medication List    STOP taking these medications   metFORMIN 500 MG tablet Commonly known as:  GLUCOPHAGE     TAKE these medications   amLODipine 5 MG tablet Commonly known as:  NORVASC Take 1 tablet by mouth once daily   aspirin EC 81 MG tablet Take 81 mg by mouth daily.   atorvastatin 40 MG tablet Commonly known as:  LIPITOR Take 40 mg by mouth daily at 6 PM.   diltiazem 120 MG 24 hr tablet Commonly known as:  CARDIZEM LA Take 1 tablet (120 mg total)  by mouth daily.   hydrALAZINE 100 MG tablet Commonly known as:  APRESOLINE Take 100 mg by mouth 2 (two) times daily.   insulin aspart 100 UNIT/ML FlexPen Commonly known as:  NOVOLOG Inject 10-12 Units into the skin 3 (three) times daily with meals.   Insulin Glargine 300 UNIT/ML Sopn Inject 40 Units into the skin daily.   losartan 100 MG tablet Commonly known as:  COZAAR Take 1 tablet by mouth once daily   metoprolol tartrate 50 MG tablet Commonly known as:  LOPRESSOR Take 50  mg by mouth daily.   ondansetron 4 MG tablet Commonly known as:  ZOFRAN Take 1 tablet (4 mg total) by mouth every 6 (six) hours as needed for nausea.   oxyCODONE 5 MG immediate release tablet Commonly known as:  Oxy IR/ROXICODONE Take 1 tablet (5 mg total) by mouth every 4 (four) hours as needed for moderate pain.   spironolactone 50 MG tablet Commonly known as:  ALDACTONE Take 50 mg by mouth daily.         Diet and Activity recommendation: See Discharge Instructions above   Consults obtained -none   Major procedures and Radiology Reports - PLEASE review detailed and final reports for all details, in brief -     Dg Chest 2 View  Result Date: 10/15/2017 CLINICAL DATA:  Bilateral lower extremity edema. EXAM: CHEST  2 VIEW COMPARISON:  Radiographs of August 08, 2017. FINDINGS: The heart size and mediastinal contours are within normal limits. Both lungs are clear. No pneumothorax or pleural effusion is noted. The visualized skeletal structures are unremarkable. IMPRESSION: No active cardiopulmonary disease. Electronically Signed   By: Marijo Conception, M.D.   On: 10/15/2017 01:29    Micro Results    No results found for this or any previous visit (from the past 240 hour(s)).     Today   Subjective:   Tracy Jennings today has no headache,no chest abdominal pain,no new weakness tingling or numbness, feels much better wants to go home today.   Objective:   Blood pressure (!) 155/70, pulse 85, temperature 98.1 F (36.7 C), temperature source Oral, resp. rate 20, height 5\' 4"  (1.626 m), weight 81.2 kg (179 lb), SpO2 97 %.   Intake/Output Summary (Last 24 hours) at 11/03/2017 0937 Last data filed at 11/03/2017 0810 Gross per 24 hour  Intake 600 ml  Output 750 ml  Net -150 ml    Exam Awake Alert, Oriented x 3, No new F.N deficits, Normal affect [place].AT,PERRAL Supple Neck,No JVD, No cervical lymphadenopathy appriciated.  Symmetrical Chest wall movement, Good air  movement bilaterally, CTAB RRR,No Gallops,Rubs or new Murmurs, No Parasternal Heave +ve B.Sounds, Abd Soft, Non tender, No organomegaly appriciated, No rebound -guarding or rigidity. No Cyanosis, Clubbing or edema, No new Rash or bruise  Data Review   CBC w Diff:  Lab Results  Component Value Date   WBC 6.7 11/01/2017   HGB 10.6 (L) 11/01/2017   HGB 11.8 (L) 06/14/2014   HCT 32.7 (L) 11/01/2017   HCT 36.1 06/14/2014   PLT 304 11/01/2017   PLT 236 06/14/2014   LYMPHOPCT 35.1 06/14/2014   MONOPCT 11.7 06/14/2014   EOSPCT 2.4 06/14/2014   BASOPCT 1.0 06/14/2014    CMP:  Lab Results  Component Value Date   NA 138 11/01/2017   NA 139 06/14/2014   K 4.3 11/01/2017   K 3.6 06/14/2014   CL 106 11/01/2017   CL 105 06/14/2014   CO2 23 11/01/2017  CO2 29 06/14/2014   BUN 22 (H) 11/01/2017   BUN 21 (H) 06/14/2014   CREATININE 2.27 (H) 11/01/2017   CREATININE 1.04 06/14/2014   PROT 6.9 10/31/2017   PROT 8.0 06/12/2014   ALBUMIN 3.1 (L) 10/31/2017   ALBUMIN 2.9 (L) 06/12/2014   BILITOT 0.5 10/31/2017   BILITOT 0.4 06/12/2014   ALKPHOS 66 10/31/2017   ALKPHOS 110 06/12/2014   AST 17 10/31/2017   AST 66 (H) 06/12/2014   ALT 16 10/31/2017   ALT 68 (H) 06/12/2014  .   Total Time in preparing paper work, data evaluation and todays exam - 35 minutes  Epifanio Lesches M.D on 11/03/2017 at 9:37 AM    Note: This dictation was prepared with Dragon dictation along with smaller phrase technology. Any transcriptional errors that result from this process are unintentional.

## 2017-11-03 NOTE — Progress Notes (Signed)
Discharge instructions reviewed with patient and husband.  Understanding was verbalized and all questions answered.  Work note and prescription provided.  Patient discharged home via wheelchair in stable condition escorted by volunteer staff.

## 2018-01-02 ENCOUNTER — Ambulatory Visit: Admit: 2018-01-02 | Payer: Managed Care, Other (non HMO) | Admitting: Internal Medicine

## 2018-01-02 SURGERY — COLONOSCOPY WITH PROPOFOL
Anesthesia: General

## 2018-01-09 ENCOUNTER — Encounter: Payer: Self-pay | Admitting: *Deleted

## 2018-01-10 ENCOUNTER — Ambulatory Visit: Payer: Managed Care, Other (non HMO) | Admitting: Anesthesiology

## 2018-01-10 ENCOUNTER — Ambulatory Visit
Admission: RE | Admit: 2018-01-10 | Discharge: 2018-01-10 | Disposition: A | Discharge status: Home / Self Care | Payer: Managed Care, Other (non HMO) | Source: Ambulatory Visit | Attending: Internal Medicine | Admitting: Internal Medicine

## 2018-01-10 ENCOUNTER — Encounter
Admission: RE | Disposition: A | Discharge status: Home / Self Care | Payer: Self-pay | Source: Ambulatory Visit | Attending: Internal Medicine

## 2018-01-10 DIAGNOSIS — Z1211 Encounter for screening for malignant neoplasm of colon: Secondary | ICD-10-CM | POA: Insufficient documentation

## 2018-01-10 DIAGNOSIS — Z800 Family history of malignant neoplasm of digestive organs: Secondary | ICD-10-CM | POA: Diagnosis not present

## 2018-01-10 DIAGNOSIS — Z7982 Long term (current) use of aspirin: Secondary | ICD-10-CM | POA: Insufficient documentation

## 2018-01-10 DIAGNOSIS — Z79899 Other long term (current) drug therapy: Secondary | ICD-10-CM | POA: Diagnosis not present

## 2018-01-10 DIAGNOSIS — K640 First degree hemorrhoids: Secondary | ICD-10-CM | POA: Insufficient documentation

## 2018-01-10 DIAGNOSIS — E1122 Type 2 diabetes mellitus with diabetic chronic kidney disease: Secondary | ICD-10-CM | POA: Insufficient documentation

## 2018-01-10 DIAGNOSIS — Z794 Long term (current) use of insulin: Secondary | ICD-10-CM | POA: Insufficient documentation

## 2018-01-10 DIAGNOSIS — I129 Hypertensive chronic kidney disease with stage 1 through stage 4 chronic kidney disease, or unspecified chronic kidney disease: Secondary | ICD-10-CM | POA: Diagnosis not present

## 2018-01-10 DIAGNOSIS — Z882 Allergy status to sulfonamides status: Secondary | ICD-10-CM | POA: Diagnosis not present

## 2018-01-10 DIAGNOSIS — D122 Benign neoplasm of ascending colon: Secondary | ICD-10-CM | POA: Diagnosis not present

## 2018-01-10 DIAGNOSIS — K5730 Diverticulosis of large intestine without perforation or abscess without bleeding: Secondary | ICD-10-CM | POA: Diagnosis not present

## 2018-01-10 DIAGNOSIS — N184 Chronic kidney disease, stage 4 (severe): Secondary | ICD-10-CM | POA: Insufficient documentation

## 2018-01-10 DIAGNOSIS — Z888 Allergy status to other drugs, medicaments and biological substances status: Secondary | ICD-10-CM | POA: Insufficient documentation

## 2018-01-10 HISTORY — PX: COLONOSCOPY WITH PROPOFOL: SHX5780

## 2018-01-10 LAB — GLUCOSE, CAPILLARY: Glucose-Capillary: 110 mg/dL — ABNORMAL HIGH (ref 65–99)

## 2018-01-10 SURGERY — COLONOSCOPY WITH PROPOFOL
Anesthesia: General

## 2018-01-10 MED ORDER — PROPOFOL 500 MG/50ML IV EMUL
INTRAVENOUS | Status: DC | PRN
  Administered 2018-01-10: 140 ug/kg/min via INTRAVENOUS

## 2018-01-10 MED ORDER — LIDOCAINE HCL (PF) 1 % IJ SOLN
2.0000 mL | Freq: Once | INTRAMUSCULAR | Status: AC
  Administered 2018-01-10: 0.3 mL via INTRADERMAL

## 2018-01-10 MED ORDER — SODIUM CHLORIDE 0.9 % IV SOLN
INTRAVENOUS | Status: DC
  Administered 2018-01-10: 1000 mL via INTRAVENOUS

## 2018-01-10 MED ORDER — PROPOFOL 10 MG/ML IV BOLUS
INTRAVENOUS | Status: DC | PRN
  Administered 2018-01-10: 70 mg via INTRAVENOUS

## 2018-01-10 MED ORDER — LIDOCAINE HCL (PF) 1 % IJ SOLN
INTRAMUSCULAR | Status: AC
  Administered 2018-01-10: 0.3 mL via INTRADERMAL
  Filled 2018-01-10: qty 2

## 2018-01-10 MED ORDER — GLYCOPYRROLATE 0.2 MG/ML IJ SOLN
INTRAMUSCULAR | Status: DC | PRN
  Administered 2018-01-10: .2 mg via INTRAVENOUS

## 2018-01-10 NOTE — Transfer of Care (Signed)
Immediate Anesthesia Transfer of Care Note  Patient: TASSIE POLLETT  Procedure(s) Performed: COLONOSCOPY WITH PROPOFOL (N/A )  Patient Location: PACU  Anesthesia Type:General  Level of Consciousness: awake, alert  and oriented  Airway & Oxygen Therapy: Patient Spontanous Breathing and Patient connected to nasal cannula oxygen  Post-op Assessment: Report given to RN and Post -op Vital signs reviewed and stable  Post vital signs: Reviewed and stable  Last Vitals:  Vitals Value Taken Time  BP 112/64 01/10/2018  8:47 AM  Temp 36.1 C 01/10/2018  8:47 AM  Pulse 95 01/10/2018  8:51 AM  Resp 19 01/10/2018  8:51 AM  SpO2 100 % 01/10/2018  8:51 AM  Vitals shown include unvalidated device data.  Last Pain:  Vitals:   01/10/18 0847  TempSrc: Tympanic  PainSc: 0-No pain         Complications: No apparent anesthesia complications

## 2018-01-10 NOTE — Op Note (Signed)
[facility] Gastroenterology Patient Name: Tracy Jennings Procedure Date: 01/10/2018 8:10 AM MRN: [id number] Account #: [id number] Date of Birth: [birth year] Admit Type: Outpatient Age: 62 Room: [facility] ENDO ROOM 3 Gender: Female Note Status: Finalized Procedure:            Colonoscopy Indications:          Screening patient at increased risk: Family history of                        1st-degree relative with colorectal cancer at age 74                        years (or older) Providers:            Keoni Risinger K. Alice Reichert MD, MD Referring MD:         [facility] (Referring MD) Medicines:            Propofol per Anesthesia Complications:        No immediate complications. Procedure:            Pre-Anesthesia Assessment:                       - The risks and benefits of the procedure and the                        sedation options and risks were discussed with the                        patient. All questions were answered and informed                        consent was obtained.                       - Patient identification and proposed procedure were                        verified prior to the procedure by the nurse. The                        procedure was verified in the procedure room.                       - ASA Grade Assessment: II - A patient with mild                        systemic disease.                       - After reviewing the risks and benefits, the patient                        was deemed in satisfactory condition to undergo the                        procedure.                       After obtaining informed consent, the colonoscope was  passed under direct vision. Throughout the procedure,                        the patient's blood pressure, pulse, and oxygen                        saturations were monitored continuously. The                        Colonoscope was introduced through the anus and   advanced to the the cecum, identified by appendiceal                        orifice and ileocecal valve. The colonoscopy was                        performed without difficulty. The patient tolerated the                        procedure well. The quality of the bowel preparation                        was fair. The ileocecal valve, appendiceal orifice, and                        rectum were photographed. The colonoscopy was performed                        with moderate difficulty due to poor endoscopic                        visualization. Successful completion of the procedure                        was aided by lavage. Findings:      The perianal and digital rectal examinations were normal. Pertinent       negatives include normal sphincter tone and no palpable rectal lesions.      A 4 mm polyp was found in the ascending colon. The polyp was sessile.       The polyp was removed with a jumbo cold forceps. Resection and retrieval       were complete.      A few small-mouthed diverticula were found in the sigmoid colon.      Non-bleeding internal hemorrhoids were found during retroflexion. The       hemorrhoids were Grade I (internal hemorrhoids that do not prolapse).      The exam was otherwise without abnormality. Impression:           - Preparation of the colon was fair.                       - One 4 mm polyp in the ascending colon, removed with a                        jumbo cold forceps. Resected and retrieved.                       - Diverticulosis in the sigmoid colon.                       -  Non-bleeding internal hemorrhoids.                       - The examination was otherwise normal. Recommendation:       - Patient has a contact number available for                        emergencies. The signs and symptoms of potential                        delayed complications were discussed with the patient.                        Return to normal activities tomorrow. Written discharge                         instructions were provided to the patient.                       - Resume previous diet.                       - Continue present medications.                       - Repeat colonoscopy in 3 years for surveillance.                       - The findings and recommendations were discussed with                        the patient and their family. Procedure Code(s):    --- Professional ---                       [phone], Colonoscopy, flexible; with biopsy, single or                        multiple Diagnosis Code(s):    --- Professional ---                       Z80.0, Family history of malignant neoplasm of                        digestive organs                       K64.0, First degree hemorrhoids                       D12.2, Benign neoplasm of ascending colon                       K57.30, Diverticulosis of large intestine without                        perforation or abscess without bleeding CPT copyright 2017 [employer]. All rights reserved. The codes documented in this report are preliminary and upon coder review may  be revised to meet current compliance requirements. Efrain Sella MD, MD 01/10/2018 8:49:22 AM This report has been signed electronically. Number of Addenda: 0 Note Initiated On: 01/10/2018 8:10 AM Scope Withdrawal Time: 0 hours 7 minutes  15 seconds  Total Procedure Duration: 0 hours 18 minutes 47 seconds       [facility]

## 2018-01-10 NOTE — Anesthesia Preprocedure Evaluation (Signed)
Anesthesia Evaluation  Patient identified by MRN, date of birth, ID band Patient awake    Reviewed: Allergy & Precautions, NPO status , Patient's Chart, lab work & pertinent test results  History of Anesthesia Complications Negative for: history of anesthetic complications  Airway Mallampati: II       Dental   Pulmonary neg sleep apnea, neg COPD,           Cardiovascular hypertension, Pt. on medications (-) Past MI and (-) CHF (-) dysrhythmias (-) Valvular Problems/Murmurs     Neuro/Psych neg Seizures    GI/Hepatic Neg liver ROS, neg GERD  ,  Endo/Other  diabetes, Type 2, Insulin Dependent  Renal/GU Renal InsufficiencyRenal disease     Musculoskeletal   Abdominal   Peds  Hematology   Anesthesia Other Findings   Reproductive/Obstetrics                             Anesthesia Physical Anesthesia Plan  ASA: III  Anesthesia Plan: General   Post-op Pain Management:    Induction: Intravenous  PONV Risk Score and Plan: 3 and Propofol infusion, TIVA and Midazolam  Airway Management Planned: Nasal Cannula  Additional Equipment:   Intra-op Plan:   Post-operative Plan:   Informed Consent: I have reviewed the patients History and Physical, chart, labs and discussed the procedure including the risks, benefits and alternatives for the proposed anesthesia with the patient or authorized representative who has indicated his/her understanding and acceptance.     Plan Discussed with:   Anesthesia Plan Comments:         Anesthesia Quick Evaluation

## 2018-01-10 NOTE — Interval H&P Note (Signed)
History and Physical Interval Note:  01/10/2018 8:12 AM  Tracy Jennings  has presented today for surgery, with the diagnosis of [facility]  The various methods of treatment have been discussed with the patient and family. After consideration of risks, benefits and other options for treatment, the patient has consented to  Procedure(s): COLONOSCOPY WITH PROPOFOL (N/A) as a surgical intervention .  The patient's history has been reviewed, patient examined, no change in status, stable for surgery.  I have reviewed the patient's chart and labs.  Questions were answered to the patient's satisfaction.     Prichard, [place]

## 2018-01-10 NOTE — Anesthesia Post-op Follow-up Note (Signed)
Anesthesia QCDR form completed.        

## 2018-01-10 NOTE — Anesthesia Postprocedure Evaluation (Signed)
Anesthesia Post Note  Patient: Tracy Jennings  Procedure(s) Performed: COLONOSCOPY WITH PROPOFOL (N/A )  Patient location during evaluation: Endoscopy Anesthesia Type: General Level of consciousness: awake and alert Pain management: pain level controlled Vital Signs Assessment: post-procedure vital signs reviewed and stable Respiratory status: spontaneous breathing and respiratory function stable Cardiovascular status: stable Anesthetic complications: no     Last Vitals:  Vitals:   01/10/18 0749 01/10/18 0847  BP: (!) 231/107 112/64  Pulse: 100   Resp: 17 18  Temp: (!) 35.9 C (!) 36.1 C  SpO2: 98% 100%    Last Pain:  Vitals:   01/10/18 0853  TempSrc:   PainSc: 0-No pain                 KEPHART,WILLIAM K

## 2018-01-10 NOTE — H&P (Signed)
Outpatient short stay form Pre-procedure 01/10/2018 8:06 AM Tracy Jennings K. Alice Reichert, M.D.  Primary Physician: Ricardo Jericho, NP  Reason for visit:  Family history of colon cancer.  History of present illness:  Patient remarks a family history of colon cancer in her father who passed away at 71. Patient's last colonoscopy was 2009. Denies rectal bleeding or weight loss.     Current Facility-Administered Medications:  .  0.9 %  sodium chloride infusion, , Intravenous, Continuous, Shylo Zamor, [place] K, MD .  lidocaine (PF) (XYLOCAINE) 1 % injection 2 mL, 2 mL, Intradermal, Once, Guy, [place] K, MD .  lidocaine (PF) (XYLOCAINE) 1 % injection, , , ,   Medications Prior to Admission  Medication Sig Dispense Refill Last Dose  . amLODipine (NORVASC) 5 MG tablet Take 1 tablet by mouth once daily   Not Taking at Unknown time  . aspirin EC 81 MG tablet Take 81 mg by mouth daily.   01/03/2018  . atorvastatin (LIPITOR) 40 MG tablet Take 40 mg by mouth daily at 6 PM.      . diltiazem (CARDIZEM LA) 120 MG 24 hr tablet Take 1 tablet (120 mg total) by mouth daily. 30 tablet 1 Past Week at Unknown time  . hydrALAZINE (APRESOLINE) 100 MG tablet Take 100 mg by mouth 2 (two) times daily.   Past Week at Unknown time  . insulin aspart (NOVOLOG) 100 UNIT/ML FlexPen Inject 10-12 Units into the skin 3 (three) times daily with meals.    Past Week at Unknown time  . Insulin Glargine 300 UNIT/ML SOPN Inject 40 Units into the skin daily.    Past Week at Unknown time  . losartan (COZAAR) 100 MG tablet Take 1 tablet by mouth once daily   Past Week at Unknown time  . metoprolol (LOPRESSOR) 50 MG tablet Take 50 mg by mouth daily.    Past Week at Unknown time  . ondansetron (ZOFRAN) 4 MG tablet Take 1 tablet (4 mg total) by mouth every 6 (six) hours as needed for nausea. 20 tablet 0   . oxyCODONE (OXY IR/ROXICODONE) 5 MG immediate release tablet Take 1 tablet (5 mg total) by mouth every 4 (four) hours as needed for  moderate pain. 30 tablet 0   . spironolactone (ALDACTONE) 50 MG tablet Take 50 mg by mouth daily.   Past Week at Unknown time     Allergies  Allergen Reactions  . Hctz [Hydrochlorothiazide]     pancreatitis  . Sulfa Antibiotics Rash  . Amlodipine Swelling  . Amlodipine Besylate Swelling     Past Medical History:  Diagnosis Date  . CKD (chronic kidney disease)    Stage IV  . Diabetes mellitus without complication ([place])    type II  . Hypertension   . Pancreatitis     Review of systems:      Physical Exam  Gen: Alert, oriented. Appears stated age.  HEENT: [place]/AT. PERRLA. Lungs: CTA, no wheezes. CV: RR nl S1, S2. Abd: soft, benign, no masses. BS+ Ext: No edema. Pulses 2+    Planned procedures: Colonoscopy.  Outpatient short stay form Pre-procedure 01/10/2018 8:11 AM Tracy Jennings K. Alice Reichert, M.D.  Primary Physician: Ricardo Jericho, NP  Reason for visit:  Family hx of colon cancer.  History of present illness: Patient is a 62 year old female with a family history of colon cancer in her father who passed away at age 71.  Last colonoscopy was in 2009, seemingly unremarkable.  Patient denies any abdominal pain, rectal bleeding or  weight loss.    Current Facility-Administered Medications:  .  0.9 %  sodium chloride infusion, , Intravenous, Continuous, [place], Benay Pike, MD, Last Rate: 20 mL/hr at 01/10/18 0808, 1,000 mL at 01/10/18 0808  Medications Prior to Admission  Medication Sig Dispense Refill Last Dose  . amLODipine (NORVASC) 5 MG tablet Take 1 tablet by mouth once daily   Not Taking at Unknown time  . aspirin EC 81 MG tablet Take 81 mg by mouth daily.   01/03/2018  . atorvastatin (LIPITOR) 40 MG tablet Take 40 mg by mouth daily at 6 PM.      . diltiazem (CARDIZEM LA) 120 MG 24 hr tablet Take 1 tablet (120 mg total) by mouth daily. 30 tablet 1 Past Week at Unknown time  . hydrALAZINE (APRESOLINE) 100 MG tablet Take 100 mg by mouth 2 (two) times daily.   Past  Week at Unknown time  . insulin aspart (NOVOLOG) 100 UNIT/ML FlexPen Inject 10-12 Units into the skin 3 (three) times daily with meals.    Past Week at Unknown time  . Insulin Glargine 300 UNIT/ML SOPN Inject 40 Units into the skin daily.    Past Week at Unknown time  . losartan (COZAAR) 100 MG tablet Take 1 tablet by mouth once daily   Past Week at Unknown time  . metoprolol (LOPRESSOR) 50 MG tablet Take 50 mg by mouth daily.    Past Week at Unknown time  . ondansetron (ZOFRAN) 4 MG tablet Take 1 tablet (4 mg total) by mouth every 6 (six) hours as needed for nausea. 20 tablet 0   . oxyCODONE (OXY IR/ROXICODONE) 5 MG immediate release tablet Take 1 tablet (5 mg total) by mouth every 4 (four) hours as needed for moderate pain. 30 tablet 0   . spironolactone (ALDACTONE) 50 MG tablet Take 50 mg by mouth daily.   Past Week at Unknown time     Allergies  Allergen Reactions  . Hctz [Hydrochlorothiazide]     pancreatitis  . Sulfa Antibiotics Rash  . Amlodipine Swelling  . Amlodipine Besylate Swelling     Past Medical History:  Diagnosis Date  . CKD (chronic kidney disease)    Stage IV  . Diabetes mellitus without complication ([place])    type II  . Hypertension   . Pancreatitis     Review of systems:   Negative   Physical Exam  Gen: Alert, oriented. Appears stated age.  HEENT: [place]/AT. PERRLA. Lungs: CTA, no wheezes. CV: RR nl S1, S2. Abd: soft, benign, no masses. BS+ Ext: No edema. Pulses 2+    Planned procedures: Colonoscopy. The patient understands the nature of the planned procedure, indications, risks, alternatives and potential complications including but not limited to bleeding, infection, perforation, damage to internal organs and possible oversedation/side effects from anesthesia. The patient agrees and gives consent to proceed.  Please refer to procedure notes for findings, recommendations and patient disposition/instructions.    Keva Darty K. Alice Reichert,  M.D. Gastroenterology 01/10/2018  8:11 AM       Benay Pike. Alice Reichert, M.D. Gastroenterology 01/10/2018  8:06 AM

## 2018-01-11 ENCOUNTER — Encounter: Payer: Self-pay | Admitting: Internal Medicine

## 2018-01-11 LAB — SURGICAL PATHOLOGY

## 2018-04-05 ENCOUNTER — Encounter (INDEPENDENT_AMBULATORY_CARE_PROVIDER_SITE_OTHER): Payer: Self-pay

## 2018-04-09 ENCOUNTER — Telehealth (INDEPENDENT_AMBULATORY_CARE_PROVIDER_SITE_OTHER): Payer: Self-pay

## 2018-04-09 NOTE — Telephone Encounter (Signed)
Patient called to ask if the reason for our call, was that to get her scheduled from a referral that Dr. Percell Miller sent over?  She states that she missed the call and she thinks that it is because of the referral.  I do now see you note on the referral.

## 2018-04-12 ENCOUNTER — Other Ambulatory Visit (INDEPENDENT_AMBULATORY_CARE_PROVIDER_SITE_OTHER): Payer: Self-pay | Admitting: Vascular Surgery

## 2018-04-12 ENCOUNTER — Ambulatory Visit (INDEPENDENT_AMBULATORY_CARE_PROVIDER_SITE_OTHER): Payer: Managed Care, Other (non HMO) | Admitting: Vascular Surgery

## 2018-04-12 ENCOUNTER — Encounter (INDEPENDENT_AMBULATORY_CARE_PROVIDER_SITE_OTHER): Payer: Self-pay | Admitting: Vascular Surgery

## 2018-04-12 ENCOUNTER — Ambulatory Visit (INDEPENDENT_AMBULATORY_CARE_PROVIDER_SITE_OTHER): Payer: Managed Care, Other (non HMO)

## 2018-04-12 VITALS — BP 211/89 | HR 88 | Resp 16 | Ht 64.0 in | Wt 180.0 lb

## 2018-04-12 DIAGNOSIS — N289 Disorder of kidney and ureter, unspecified: Secondary | ICD-10-CM

## 2018-04-12 DIAGNOSIS — I159 Secondary hypertension, unspecified: Secondary | ICD-10-CM

## 2018-04-12 DIAGNOSIS — N184 Chronic kidney disease, stage 4 (severe): Secondary | ICD-10-CM | POA: Diagnosis not present

## 2018-04-12 DIAGNOSIS — E785 Hyperlipidemia, unspecified: Secondary | ICD-10-CM | POA: Diagnosis not present

## 2018-04-12 DIAGNOSIS — E118 Type 2 diabetes mellitus with unspecified complications: Secondary | ICD-10-CM | POA: Diagnosis not present

## 2018-04-12 DIAGNOSIS — D649 Anemia, unspecified: Secondary | ICD-10-CM

## 2018-04-12 HISTORY — DX: Anemia, unspecified: D64.9

## 2018-04-12 NOTE — Progress Notes (Signed)
Subjective:    Patient ID: Tracy Jennings, female    DOB: 1955/10/28, 62 y.o.   MRN: [id number] Chief Complaint  Patient presents with  . New Patient (Initial Visit)    Vein mapping and consult   Presents as a new patient referred by Dr. Marian Sorrow for evaluation of a dialysis access creation.  The patient is currently at 14% of her kidney function and her nephrologist asked she be seen as soon as possible for creation.  The patient's most recent creatinine on Jan 14, 2018 was 2.92.  The patient denies any uremic symptoms however is experiencing bilateral lower extremity edema.  The patient is right-hand dominant.  The patient underwent bilateral upper extremity vein mapping which was notable for veins anatomically too small to create a fistula therefore a left brachial axillary graft creation will be planned.  Patient is currently not on dialysis at this moment however it seems that she will be needing dialysis in the very near future.  The patient understands it will take approximately 4 weeks for her access to mature and if her access is not matured and she needs dialysis she will have to have a PermCath placed.  The patient denies any issues with her left upper extremity.  Patient denies any fever, nausea or vomiting.  Review of Systems  Constitutional: Negative.   HENT: Negative.   Eyes: Negative.   Respiratory: Negative.   Cardiovascular: Negative.   Gastrointestinal: Negative.   Endocrine: Negative.   Genitourinary:       Chronic kidney disease stage V  Musculoskeletal: Negative.   Skin: Negative.   Allergic/Immunologic: Negative.   Neurological: Negative.   Hematological: Negative.   Psychiatric/Behavioral: Negative.       Objective:   Physical Exam  Constitutional: She is oriented to person, place, and time. She appears well-developed and well-nourished. No distress.  HENT:  Head: Normocephalic and atraumatic.  Right Ear: External ear normal.  Left Ear: External ear  normal.  Eyes: Pupils are equal, round, and reactive to light. Conjunctivae and EOM are normal.  Neck: Normal range of motion.  Cardiovascular: Normal rate, regular rhythm, normal heart sounds and intact distal pulses. Exam reveals no friction rub.  No murmur heard. Pulses:      Radial pulses are 2+ on the right side, and 2+ on the left side.  Pulmonary/Chest: Effort normal and breath sounds normal. No stridor. No respiratory distress. She has no wheezes.  Abdominal: Soft. Bowel sounds are normal. She exhibits no distension and no mass. There is no tenderness. There is no guarding.  Musculoskeletal: Normal range of motion. She exhibits no edema.  Neurological: She is alert and oriented to person, place, and time. She displays normal reflexes. No cranial nerve deficit. Coordination normal.  Skin: Skin is warm and dry. She is not diaphoretic. No erythema. No pallor.  Psychiatric: She has a normal mood and affect. Her behavior is normal. Judgment and thought content normal.  Vitals reviewed.  BP (!) 211/89 (BP Location: Right Arm)   Pulse 88   Resp 16   Ht 5\' 4"  (1.626 m)   Wt 180 lb (81.6 kg)   BMI 30.90 kg/m   Past Medical History:  Diagnosis Date  . CKD (chronic kidney disease)    Stage IV  . Diabetes mellitus without complication ([place])    type II  . Hypertension   . Pancreatitis    Social History   Socioeconomic History  . Marital status: Married  Spouse name: Not on file  . Number of children: Not on file  . Years of education: Not on file  . Highest education level: Not on file  Occupational History  . Not on file  Social Needs  . Financial resource strain: Not on file  . Food insecurity:    Worry: Not on file    Inability: Not on file  . Transportation needs:    Medical: Not on file    Non-medical: Not on file  Tobacco Use  . Smoking status: Never Smoker  . Smokeless tobacco: Never Used  Substance and Sexual Activity  . Alcohol use: No  . Drug use: Never    . Sexual activity: Not on file  Lifestyle  . Physical activity:    Days per week: Not on file    Minutes per session: Not on file  . Stress: Not on file  Relationships  . Social connections:    Talks on phone: Not on file    Gets together: Not on file    Attends religious service: Not on file    Active member of club or organization: Not on file    Attends meetings of clubs or organizations: Not on file    Relationship status: Not on file  . Intimate partner violence:    Fear of current or ex partner: Not on file    Emotionally abused: Not on file    Physically abused: Not on file    Forced sexual activity: Not on file  Other Topics Concern  . Not on file  Social History Narrative  . Not on file   Past Surgical History:  Procedure Laterality Date  . 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 CATARACT EXTRACTION    . CHOLECYSTECTOMY    . COLONOSCOPY    . COLONOSCOPY WITH PROPOFOL N/A 01/10/2018   Procedure: COLONOSCOPY WITH PROPOFOL;  Surgeon: Toledo, Benay Pike, MD;  Location: [facility] ENDOSCOPY;  Service: Gastroenterology;  Laterality: N/A;  . EYE SURGERY    . TUBAL LIGATION  1984   Family History  Problem Relation Age of Onset  . Stroke Mother   . Hypertension Mother   . Cancer Father   . Diabetes Sister   . Diabetes Maternal Grandmother   . Diabetes Maternal Grandfather   . Diabetes Paternal Grandmother   . Diabetes Paternal Grandfather    Allergies  Allergen Reactions  . Hctz [Hydrochlorothiazide]     pancreatitis  . Sulfa Antibiotics Rash  . Amlodipine Swelling  . Amlodipine Besylate Swelling      Assessment & Plan:  Presents as a new patient referred by Dr. Marian Sorrow for evaluation of a dialysis access creation.  The patient is currently at 14% of her kidney function and her nephrologist asked she be seen as soon as possible for creation.  The patient's most recent creatinine on Jan 14, 2018 was 2.92.  The patient denies any uremic symptoms however is  experiencing bilateral lower extremity edema.  The patient is right-hand dominant.  The patient underwent bilateral upper extremity vein mapping which was notable for veins anatomically too small to create a fistula therefore a left brachial axillary graft creation will be planned.  Patient is currently not on dialysis at this moment however it seems that she will be needing dialysis in the very near future.  The patient understands it will take approximately 4 weeks for her access to mature and if her access is not matured and she needs dialysis  she will have to have a PermCath placed.  The patient denies any issues with her left upper extremity.  Patient denies any fever, nausea or vomiting.  1. CKD (chronic kidney disease), stage IV ([place]) - New The patient is currently at 14% of her renal function The patient's most recent creatinine on Jan 14, 2018 was 2.92 The patient is not currently experiencing uremic symptoms however is experiencing bilateral lower extremity edema The patient underwent bilateral upper extremity vein mapping which was notable for veins anatomically too small for creation of a fistula. Recommend a left upper extremity brachial axillary graft creation Procedure, risks and benefits explained to the patient All questions answered Patient would like to proceed with the procedure Patient understands her graft will need to mature for approximately 4 weeks. If the patient needs to start dialysis before her graft is matured she will need to have a PermCath placed The patient understands no blood pressure, IV and blood draws to the left upper extremity  2. Type 2 diabetes mellitus with complication, unspecified whether long term insulin use (HCC) - Stable Encouraged good control as its slows the progression of atherosclerotic disease  3. Hyperlipidemia, unspecified hyperlipidemia type - Stable Encouraged good control as its slows the progression of atherosclerotic disease  4.  Secondary hypertension - Stable Encouraged good control as its slows the progression of atherosclerotic disease  Current Outpatient Medications on File Prior to Visit  Medication Sig Dispense Refill  . amLODipine (NORVASC) 5 MG tablet Take 1 tablet by mouth once daily    . aspirin EC 81 MG tablet Take 81 mg by mouth daily.    . bumetanide (BUMEX) 1 MG tablet Take by mouth.    . carvedilol (COREG CR) 40 MG 24 hr capsule Take by mouth.    . diltiazem (CARDIZEM LA) 120 MG 24 hr tablet Take 1 tablet (120 mg total) by mouth daily. 30 tablet 1  . fluticasone (FLONASE) 50 MCG/ACT nasal spray 2 sprays by Each Nare route daily.    . hydrALAZINE (APRESOLINE) 100 MG tablet Take 100 mg by mouth 2 (two) times daily.    . insulin aspart (NOVOLOG) 100 UNIT/ML FlexPen Inject 10-12 Units into the skin 3 (three) times daily with meals.     . Insulin Glargine 300 UNIT/ML SOPN Inject 40 Units into the skin daily.     Marland Kitchen losartan (COZAAR) 100 MG tablet Take 1 tablet by mouth once daily    . metoprolol (LOPRESSOR) 50 MG tablet Take 50 mg by mouth daily.     . ondansetron (ZOFRAN) 4 MG tablet Take 1 tablet (4 mg total) by mouth every 6 (six) hours as needed for nausea. 20 tablet 0  . oxyCODONE (OXY IR/ROXICODONE) 5 MG immediate release tablet Take 1 tablet (5 mg total) by mouth every 4 (four) hours as needed for moderate pain. 30 tablet 0  . spironolactone (ALDACTONE) 50 MG tablet Take 50 mg by mouth daily.    . Vitamin D, Ergocalciferol, (DRISDOL) 50000 units CAPS capsule TAKE ONE CAPSULE BY MOUTH ONE TIME PER WEEK  0  . atorvastatin (LIPITOR) 40 MG tablet Take 40 mg by mouth daily at 6 PM.      No current facility-administered medications on file prior to visit.    There are no Patient Instructions on file for this visit. No follow-ups on file.  Minoru Chap A Kaveri Perras, PA-C

## 2018-04-23 ENCOUNTER — Encounter (INDEPENDENT_AMBULATORY_CARE_PROVIDER_SITE_OTHER): Payer: Self-pay

## 2018-05-03 ENCOUNTER — Other Ambulatory Visit (INDEPENDENT_AMBULATORY_CARE_PROVIDER_SITE_OTHER): Payer: Self-pay | Admitting: Nurse Practitioner

## 2018-05-04 ENCOUNTER — Other Ambulatory Visit: Payer: Self-pay

## 2018-05-04 ENCOUNTER — Encounter
Admission: RE | Admit: 2018-05-04 | Discharge: 2018-05-04 | Disposition: A | Discharge status: Home / Self Care | Payer: Managed Care, Other (non HMO) | Source: Ambulatory Visit | Attending: Vascular Surgery | Admitting: Vascular Surgery

## 2018-05-04 DIAGNOSIS — Z01810 Encounter for preprocedural cardiovascular examination: Secondary | ICD-10-CM | POA: Insufficient documentation

## 2018-05-04 DIAGNOSIS — Z01812 Encounter for preprocedural laboratory examination: Secondary | ICD-10-CM | POA: Diagnosis not present

## 2018-05-04 HISTORY — DX: Anemia, unspecified: D64.9

## 2018-05-04 HISTORY — DX: Hyperlipidemia, unspecified: E78.5

## 2018-05-04 HISTORY — DX: Cardiac murmur, unspecified: R01.1

## 2018-05-04 HISTORY — DX: End stage renal disease: N18.6

## 2018-05-04 HISTORY — DX: Type 2 diabetes mellitus with other specified complication: E11.69

## 2018-05-04 HISTORY — DX: Peripheral vascular disease, unspecified: I73.9

## 2018-05-04 LAB — CBC WITH DIFFERENTIAL/PLATELET
Basophils Absolute: 0.1 10*3/uL (ref 0–0.1)
Basophils Relative: 1 %
Eosinophils Absolute: 0.3 10*3/uL (ref 0–0.7)
Eosinophils Relative: 5 %
HCT: 31.2 % — ABNORMAL LOW (ref 35.0–47.0)
Hemoglobin: 10.4 g/dL — ABNORMAL LOW (ref 12.0–16.0)
Lymphocytes Relative: 26 %
Lymphs Abs: 1.7 10*3/uL (ref 1.0–3.6)
MCH: 30 pg (ref 26.0–34.0)
MCHC: 33.3 g/dL (ref 32.0–36.0)
MCV: 90 fL (ref 80.0–100.0)
Monocytes Absolute: 0.4 10*3/uL (ref 0.2–0.9)
Monocytes Relative: 6 %
Neutro Abs: 4.1 10*3/uL (ref 1.4–6.5)
Neutrophils Relative %: 62 %
Platelets: 270 10*3/uL (ref 150–440)
RBC: 3.46 MIL/uL — ABNORMAL LOW (ref 3.80–5.20)
RDW: 13.9 % (ref 11.5–14.5)
WBC: 6.7 10*3/uL (ref 3.6–11.0)

## 2018-05-04 LAB — BASIC METABOLIC PANEL
Anion gap: 9 (ref 5–15)
BUN: 57 mg/dL — ABNORMAL HIGH (ref 6–20)
CO2: 25 mmol/L (ref 22–32)
Calcium: 8.6 mg/dL — ABNORMAL LOW (ref 8.9–10.3)
Chloride: 107 mmol/L (ref 98–111)
Creatinine, Ser: 5.31 mg/dL — ABNORMAL HIGH (ref 0.44–1.00)
GFR calc Af Amer: 9 mL/min — ABNORMAL LOW (ref >60)
GFR calc non Af Amer: 8 mL/min — ABNORMAL LOW (ref >60)
Glucose, Bld: 154 mg/dL — ABNORMAL HIGH (ref 70–99)
Potassium: 4.6 mmol/L (ref 3.5–5.1)
Sodium: 141 mmol/L (ref 135–145)

## 2018-05-04 LAB — PROTIME-INR
INR: 0.97
Prothrombin Time: 12.8 seconds (ref 11.4–15.2)

## 2018-05-04 LAB — TYPE AND SCREEN
ABO/RH(D): A POS
Antibody Screen: NEGATIVE

## 2018-05-04 LAB — APTT: aPTT: 32 seconds (ref 24–36)

## 2018-05-04 NOTE — Patient Instructions (Signed)
Your procedure is scheduled on: Friday, May 11, 2018  Report to [facility]   DO NOT STOP ON THE FIRST FLOOR TO REGISTER  To find out your arrival time please call [phone] between 1PM - 3PM on Thursday, May 10, 2018  Remember: Instructions that are not followed completely may result in serious medical risk,  up to and including death, or upon the discretion of your surgeon and anesthesiologist your  surgery may need to be rescheduled.     _X__ 1. Do not eat food after midnight the night before your procedure.                 No gum chewing or hard candies. ABSOLUTELY NOTHING SOLID IN YOUR MOUTH AFTER MIDNIGHT                 You may drink clear liquids up to 2 hours before you are scheduled to arrive for your surgery-                  DO not drink clear liquids within 2 hours of the start of your surgery.                  Clear Liquids include:  water, apple juice without pulp, clear carbohydrate                 drink such as Clearfast of Gatorade, Black Coffee or Tea (Do not add                 anything to coffee or tea). DIABETICS SHOULD STICK WITH WATER UNLESS YOU FEEL THAT YOUR                        SUGAR IS DROPPING, THEN APPLE JUICE OR GATORADE WILL BE GOOD.  __X__2.  On the morning of surgery brush your teeth with toothpaste and water,                   You may rinse your mouth with mouthwash if you wish.                      Do not swallow any toothpaste of mouthwash.     _X__ 3.  No Alcohol for 24 hours before or after surgery.   _X__ 4.  Do Not Smoke or use e-cigarettes For 24 Hours Prior to Your Surgery.                 Do not use any chewable tobacco products for at least 6 hours prior to                 surgery.  ____  5.  Bring all medications with you on the day of surgery if instructed.   ____  6.  Notify your doctor if there is any change in your medical condition      (cold, fever, infections).      Do not wear jewelry, make-up, hairpins, clips or nail polish. Do not wear lotions, powders, or perfumes. You may NOT wear deodorant. Do not shave 48 hours prior to surgery. Men may shave face and neck. Do not bring valuables to the hospital.    [facility] is not responsible for any belongings or valuables.  Contacts, dentures or bridgework may not be worn into surgery. Leave your suitcase in the car. After surgery it may be brought  to your room. For patients admitted to the hospital, discharge time is determined by your treatment team.   Patients discharged the day of surgery will not be allowed to drive home.   Please read over the following fact sheets that you were given:   PREPARING FOR SURGERY    ____ Take these medicines the morning of surgery with A SIP OF WATER:    1.HYDRALAZINE  2. ATORVASTATIN  3. CARDIAZEM  4. CARVEDILOL **  5.  6.  ____ Fleet Enema (as directed)   __X__ Use CHG Soap as directed  ____ Use inhalers on the day of surgery  ____ Stop metformin 2 days prior to surgery    _X__ No insulin the morning of surgery.   _X___ Stop ALL ASPIRIN PRODUCTS  __X__ Stop Anti-inflammatories AS OF TODAY   ____ Stop supplements until after surgery.    ____ Bring C-Pap to the hospital.   CONTINUE TAKING ALL OF YOUR REGULAR MEDICINES UP UNTIL THE DAY OF SURGERY    ONLY TAKE THE ONES LISTED ABOVE ON THE MORNING OF SURGERY  WEAR A LOOSE FITTING AND COMFORTABLE SHIRT

## 2018-05-04 NOTE — Pre-Procedure Instructions (Signed)
ECG 12 Lead5/01/2018 [facility] Component Name Value Ref Range  EKG Systolic BP  mmHg  EKG Diastolic BP  mmHg  EKG Ventricular Rate 96 BPM  EKG Atrial Rate 96 BPM  EKG P-R Interval 142 ms  EKG QRS Duration 82 ms  EKG Q-T Interval 354 ms  EKG QTC Calculation 447 ms  EKG Calculated P Axis 68 degrees  EKG Calculated R Axis 9 degrees  EKG Calculated T Axis 33 degrees  QTC Fredericia 414 ms  Result Narrative  NORMAL SINUS RHYTHM NON-SPECIFIC ST/T WAVE CHANGES BORDERLINE ECG WHEN COMPARED WITH ECG OF 12-Feb-2015 16:18, NONSPECIFIC T WAVE ABNORMALITY HAS REPLACED INVERTED T WAVES IN INFERIOR LEADS NONSPECIFIC T WAVE ABNORMALITY NOW EVIDENT IN LATERAL LEADS Confirmed by Chong Sicilian ([id number]) on 01/14/2018 1:55:49 PM  Other Result Information  Interface, Rad Results In - 01/14/2018  1:56 PM EDT NORMAL SINUS RHYTHM NON-SPECIFIC ST/T WAVE CHANGES BORDERLINE ECG WHEN COMPARED WITH ECG OF 12-Feb-2015 16:18, NONSPECIFIC T WAVE ABNORMALITY HAS REPLACED INVERTED T WAVES IN INFERIOR LEADS NONSPECIFIC T WAVE ABNORMALITY NOW EVIDENT IN LATERAL LEADS Confirmed by Chong Sicilian ([id number]) on 01/14/2018 1:55:49 PM

## 2018-05-10 MED ORDER — CEFAZOLIN SODIUM-DEXTROSE 2-4 GM/100ML-% IV SOLN
2.0000 g | INTRAVENOUS | Status: AC
  Administered 2018-05-11: 2 g via INTRAVENOUS

## 2018-05-10 MED ORDER — CLINDAMYCIN PHOSPHATE 900 MG/50ML IV SOLN
900.0000 mg | INTRAVENOUS | Status: AC
  Administered 2018-05-11: 900 mg via INTRAVENOUS

## 2018-05-11 ENCOUNTER — Ambulatory Visit: Payer: Managed Care, Other (non HMO) | Admitting: Certified Registered"

## 2018-05-11 ENCOUNTER — Encounter: Payer: Self-pay | Admitting: *Deleted

## 2018-05-11 ENCOUNTER — Inpatient Hospital Stay
Admission: RE | Admit: 2018-05-11 | Payer: Managed Care, Other (non HMO) | Source: Ambulatory Visit | Admitting: Vascular Surgery

## 2018-05-11 ENCOUNTER — Ambulatory Visit: Payer: Managed Care, Other (non HMO) | Admitting: Anesthesiology

## 2018-05-11 ENCOUNTER — Encounter
Admission: AD | Disposition: A | Discharge status: Home Health | Payer: Self-pay | Source: Ambulatory Visit | Attending: Family Medicine

## 2018-05-11 ENCOUNTER — Inpatient Hospital Stay: Payer: Managed Care, Other (non HMO)

## 2018-05-11 ENCOUNTER — Observation Stay: Payer: Managed Care, Other (non HMO)

## 2018-05-11 ENCOUNTER — Other Ambulatory Visit: Payer: Self-pay

## 2018-05-11 ENCOUNTER — Inpatient Hospital Stay
Admission: AD | Admit: 2018-05-11 | Discharge: 2018-05-25 | DRG: 981 | Disposition: A | Discharge status: Home Health | Payer: Managed Care, Other (non HMO) | Source: Ambulatory Visit | Attending: Internal Medicine | Admitting: Internal Medicine

## 2018-05-11 DIAGNOSIS — R092 Respiratory arrest: Secondary | ICD-10-CM | POA: Diagnosis not present

## 2018-05-11 DIAGNOSIS — J9811 Atelectasis: Secondary | ICD-10-CM | POA: Diagnosis not present

## 2018-05-11 DIAGNOSIS — N2581 Secondary hyperparathyroidism of renal origin: Secondary | ICD-10-CM | POA: Diagnosis present

## 2018-05-11 DIAGNOSIS — N179 Acute kidney failure, unspecified: Secondary | ICD-10-CM | POA: Diagnosis present

## 2018-05-11 DIAGNOSIS — Z79899 Other long term (current) drug therapy: Secondary | ICD-10-CM

## 2018-05-11 DIAGNOSIS — E870 Hyperosmolality and hypernatremia: Secondary | ICD-10-CM | POA: Diagnosis not present

## 2018-05-11 DIAGNOSIS — Z89422 Acquired absence of other left toe(s): Secondary | ICD-10-CM

## 2018-05-11 DIAGNOSIS — J189 Pneumonia, unspecified organism: Secondary | ICD-10-CM | POA: Diagnosis not present

## 2018-05-11 DIAGNOSIS — Z01818 Encounter for other preprocedural examination: Secondary | ICD-10-CM

## 2018-05-11 DIAGNOSIS — T829XXA Unspecified complication of cardiac and vascular prosthetic device, implant and graft, initial encounter: Secondary | ICD-10-CM | POA: Diagnosis present

## 2018-05-11 DIAGNOSIS — Z888 Allergy status to other drugs, medicaments and biological substances status: Secondary | ICD-10-CM

## 2018-05-11 DIAGNOSIS — E1151 Type 2 diabetes mellitus with diabetic peripheral angiopathy without gangrene: Secondary | ICD-10-CM | POA: Diagnosis present

## 2018-05-11 DIAGNOSIS — N186 End stage renal disease: Secondary | ICD-10-CM | POA: Diagnosis present

## 2018-05-11 DIAGNOSIS — J9601 Acute respiratory failure with hypoxia: Secondary | ICD-10-CM | POA: Diagnosis not present

## 2018-05-11 DIAGNOSIS — G9341 Metabolic encephalopathy: Secondary | ICD-10-CM | POA: Diagnosis present

## 2018-05-11 DIAGNOSIS — I509 Heart failure, unspecified: Secondary | ICD-10-CM

## 2018-05-11 DIAGNOSIS — D696 Thrombocytopenia, unspecified: Secondary | ICD-10-CM | POA: Diagnosis not present

## 2018-05-11 DIAGNOSIS — T82868A Thrombosis of vascular prosthetic devices, implants and grafts, initial encounter: Secondary | ICD-10-CM | POA: Diagnosis not present

## 2018-05-11 DIAGNOSIS — R570 Cardiogenic shock: Secondary | ICD-10-CM | POA: Diagnosis not present

## 2018-05-11 DIAGNOSIS — A419 Sepsis, unspecified organism: Secondary | ICD-10-CM | POA: Diagnosis not present

## 2018-05-11 DIAGNOSIS — E785 Hyperlipidemia, unspecified: Secondary | ICD-10-CM | POA: Diagnosis present

## 2018-05-11 DIAGNOSIS — Z882 Allergy status to sulfonamides status: Secondary | ICD-10-CM | POA: Diagnosis not present

## 2018-05-11 DIAGNOSIS — E876 Hypokalemia: Secondary | ICD-10-CM | POA: Diagnosis not present

## 2018-05-11 DIAGNOSIS — E872 Acidosis: Secondary | ICD-10-CM | POA: Diagnosis present

## 2018-05-11 DIAGNOSIS — I469 Cardiac arrest, cause unspecified: Secondary | ICD-10-CM | POA: Diagnosis not present

## 2018-05-11 DIAGNOSIS — Z978 Presence of other specified devices: Secondary | ICD-10-CM

## 2018-05-11 DIAGNOSIS — Z452 Encounter for adjustment and management of vascular access device: Secondary | ICD-10-CM

## 2018-05-11 DIAGNOSIS — Y838 Other surgical procedures as the cause of abnormal reaction of the patient, or of later complication, without mention of misadventure at the time of the procedure: Secondary | ICD-10-CM | POA: Diagnosis not present

## 2018-05-11 DIAGNOSIS — E1122 Type 2 diabetes mellitus with diabetic chronic kidney disease: Secondary | ICD-10-CM | POA: Diagnosis present

## 2018-05-11 DIAGNOSIS — R4182 Altered mental status, unspecified: Secondary | ICD-10-CM | POA: Diagnosis not present

## 2018-05-11 DIAGNOSIS — J95821 Acute postprocedural respiratory failure: Secondary | ICD-10-CM | POA: Diagnosis present

## 2018-05-11 DIAGNOSIS — Z794 Long term (current) use of insulin: Secondary | ICD-10-CM | POA: Diagnosis not present

## 2018-05-11 DIAGNOSIS — D631 Anemia in chronic kidney disease: Secondary | ICD-10-CM | POA: Diagnosis present

## 2018-05-11 DIAGNOSIS — Z4659 Encounter for fitting and adjustment of other gastrointestinal appliance and device: Secondary | ICD-10-CM

## 2018-05-11 DIAGNOSIS — I132 Hypertensive heart and chronic kidney disease with heart failure and with stage 5 chronic kidney disease, or end stage renal disease: Secondary | ICD-10-CM | POA: Diagnosis present

## 2018-05-11 HISTORY — PX: THROMBECTOMY W/ EMBOLECTOMY: SHX2507

## 2018-05-11 HISTORY — PX: AV FISTULA PLACEMENT: SHX1204

## 2018-05-11 LAB — COMPREHENSIVE METABOLIC PANEL
ALT: 32 U/L (ref 0–44)
AST: 39 U/L (ref 15–41)
Albumin: 2.8 g/dL — ABNORMAL LOW (ref 3.5–5.0)
Alkaline Phosphatase: 42 U/L (ref 38–126)
Anion gap: 7 (ref 5–15)
BUN: 59 mg/dL — ABNORMAL HIGH (ref 6–20)
CO2: 21 mmol/L — ABNORMAL LOW (ref 22–32)
Calcium: 8 mg/dL — ABNORMAL LOW (ref 8.9–10.3)
Chloride: 111 mmol/L (ref 98–111)
Creatinine, Ser: 5.73 mg/dL — ABNORMAL HIGH (ref 0.44–1.00)
GFR calc Af Amer: 8 mL/min — ABNORMAL LOW (ref >60)
GFR calc non Af Amer: 7 mL/min — ABNORMAL LOW (ref >60)
Glucose, Bld: 192 mg/dL — ABNORMAL HIGH (ref 70–99)
Potassium: 5.3 mmol/L — ABNORMAL HIGH (ref 3.5–5.1)
Sodium: 139 mmol/L (ref 135–145)
Total Bilirubin: 0.5 mg/dL (ref 0.3–1.2)
Total Protein: 5.8 g/dL — ABNORMAL LOW (ref 6.5–8.1)

## 2018-05-11 LAB — CBC WITH DIFFERENTIAL/PLATELET
Basophils Absolute: 0.1 10*3/uL (ref 0–0.1)
Basophils Relative: 1 %
Eosinophils Absolute: 0 10*3/uL (ref 0–0.7)
Eosinophils Relative: 0 %
HCT: 27.5 % — ABNORMAL LOW (ref 35.0–47.0)
Hemoglobin: 9 g/dL — ABNORMAL LOW (ref 12.0–16.0)
Lymphocytes Relative: 3 %
Lymphs Abs: 0.3 10*3/uL — ABNORMAL LOW (ref 1.0–3.6)
MCH: 29.5 pg (ref 26.0–34.0)
MCHC: 32.6 g/dL (ref 32.0–36.0)
MCV: 90.5 fL (ref 80.0–100.0)
Monocytes Absolute: 0.6 10*3/uL (ref 0.2–0.9)
Monocytes Relative: 5 %
Neutro Abs: 11.2 10*3/uL — ABNORMAL HIGH (ref 1.4–6.5)
Neutrophils Relative %: 91 %
Platelets: 207 10*3/uL (ref 150–440)
RBC: 3.04 MIL/uL — ABNORMAL LOW (ref 3.80–5.20)
RDW: 14.1 % (ref 11.5–14.5)
WBC: 12.3 10*3/uL — ABNORMAL HIGH (ref 3.6–11.0)

## 2018-05-11 LAB — ABO/RH: ABO/RH(D): A POS

## 2018-05-11 LAB — GLUCOSE, CAPILLARY
Glucose-Capillary: 71 mg/dL (ref 70–99)
Glucose-Capillary: 114 mg/dL — ABNORMAL HIGH (ref 70–99)
Glucose-Capillary: 110 mg/dL — ABNORMAL HIGH (ref 70–99)
Glucose-Capillary: 128 mg/dL — ABNORMAL HIGH (ref 70–99)
Glucose-Capillary: 177 mg/dL — ABNORMAL HIGH (ref 70–99)
Glucose-Capillary: 201 mg/dL — ABNORMAL HIGH (ref 70–99)
Glucose-Capillary: 205 mg/dL — ABNORMAL HIGH (ref 70–99)

## 2018-05-11 LAB — PROTIME-INR
INR: 1.07
Prothrombin Time: 13.8 seconds (ref 11.4–15.2)

## 2018-05-11 LAB — BLOOD GAS, ARTERIAL
Acid-base deficit: 6.9 mmol/L — ABNORMAL HIGH (ref 0.0–2.0)
Bicarbonate: 18.5 mmol/L — ABNORMAL LOW (ref 20.0–28.0)
FIO2: 0.5
MECHVT: 500 mL
O2 Saturation: 99.6 %
PEEP: 5 cmH2O
Patient temperature: 37
RATE: 15 resp/min
pCO2 arterial: 36 mmHg (ref 32.0–48.0)
pH, Arterial: 7.32 — ABNORMAL LOW (ref 7.350–7.450)
pO2, Arterial: 193 mmHg — ABNORMAL HIGH (ref 83.0–108.0)

## 2018-05-11 LAB — MRSA PCR SCREENING: MRSA by PCR: NEGATIVE

## 2018-05-11 LAB — BASIC METABOLIC PANEL
Anion gap: 10 (ref 5–15)
BUN: 60 mg/dL — ABNORMAL HIGH (ref 6–20)
CO2: 26 mmol/L (ref 22–32)
Calcium: 8.2 mg/dL — ABNORMAL LOW (ref 8.9–10.3)
Chloride: 109 mmol/L (ref 98–111)
Creatinine, Ser: 5.49 mg/dL — ABNORMAL HIGH (ref 0.44–1.00)
GFR calc Af Amer: 9 mL/min — ABNORMAL LOW (ref >60)
GFR calc non Af Amer: 8 mL/min — ABNORMAL LOW (ref >60)
Glucose, Bld: 221 mg/dL — ABNORMAL HIGH (ref 70–99)
Potassium: 4.9 mmol/L (ref 3.5–5.1)
Sodium: 145 mmol/L (ref 135–145)

## 2018-05-11 LAB — PHOSPHORUS: Phosphorus: 5.9 mg/dL — ABNORMAL HIGH (ref 2.5–4.6)

## 2018-05-11 LAB — APTT: aPTT: 27 seconds (ref 24–36)

## 2018-05-11 LAB — TROPONIN I
Troponin I: 0.03 ng/mL (ref <0.03)
Troponin I: 0.05 ng/mL (ref <0.03)

## 2018-05-11 LAB — MAGNESIUM: Magnesium: 1.7 mg/dL (ref 1.7–2.4)

## 2018-05-11 SURGERY — THROMBECTOMY ARTERIOVENOUS FISTULA
Anesthesia: General

## 2018-05-11 SURGERY — ARTERIOVENOUS (AV) FISTULA CREATION
Anesthesia: General | Laterality: Left

## 2018-05-11 MED ORDER — NEOSTIGMINE METHYLSULFATE 10 MG/10ML IV SOLN
INTRAVENOUS | Status: AC
  Filled 2018-05-11: qty 1

## 2018-05-11 MED ORDER — CISATRACURIUM BOLUS VIA INFUSION
0.1000 mg/kg | Freq: Once | INTRAVENOUS | Status: AC
  Administered 2018-05-12: 8 mg via INTRAVENOUS
  Filled 2018-05-11: qty 8

## 2018-05-11 MED ORDER — SPIRONOLACTONE 25 MG PO TABS: 50.0000 mg | ORAL_TABLET | Freq: Every day | ORAL | Status: DC

## 2018-05-11 MED ORDER — INSULIN GLARGINE 100 UNIT/ML ~~LOC~~ SOLN
40.0000 [IU] | Freq: Every day | SUBCUTANEOUS | Status: DC
  Filled 2018-05-11: qty 0.4

## 2018-05-11 MED ORDER — HYDRALAZINE HCL 50 MG PO TABS: 50.0000 mg | ORAL_TABLET | Freq: Three times a day (TID) | ORAL | Status: DC

## 2018-05-11 MED ORDER — LIDOCAINE HCL (PF) 2 % IJ SOLN
INTRAMUSCULAR | Status: AC
  Filled 2018-05-11: qty 10

## 2018-05-11 MED ORDER — FENTANYL CITRATE (PF) 100 MCG/2ML IJ SOLN
INTRAMUSCULAR | Status: DC | PRN
  Administered 2018-05-11: 50 ug via INTRAVENOUS
  Administered 2018-05-11: 100 ug via INTRAVENOUS

## 2018-05-11 MED ORDER — PROMETHAZINE HCL 25 MG/ML IJ SOLN
6.2500 mg | Freq: Once | INTRAMUSCULAR | Status: AC
  Administered 2018-05-11: 6.25 mg via INTRAVENOUS

## 2018-05-11 MED ORDER — BUPIVACAINE HCL (PF) 0.5 % IJ SOLN
INTRAMUSCULAR | Status: AC
  Filled 2018-05-11: qty 30

## 2018-05-11 MED ORDER — ONDANSETRON HCL 4 MG/2ML IJ SOLN
4.0000 mg | Freq: Four times a day (QID) | INTRAMUSCULAR | Status: DC | PRN
  Administered 2018-05-11: 4 mg via INTRAVENOUS

## 2018-05-11 MED ORDER — HYDROCODONE-ACETAMINOPHEN 5-325 MG PO TABS: 1.0000 | ORAL_TABLET | Freq: Four times a day (QID) | ORAL | 0 refills | Status: DC | PRN

## 2018-05-11 MED ORDER — NEOSTIGMINE METHYLSULFATE 10 MG/10ML IV SOLN
INTRAVENOUS | Status: DC | PRN
  Administered 2018-05-11: 5 mg via INTRAVENOUS

## 2018-05-11 MED ORDER — MORPHINE SULFATE (PF) 4 MG/ML IV SOLN: 2.0000 mg | INTRAVENOUS | Status: DC | PRN

## 2018-05-11 MED ORDER — ASPIRIN EC 81 MG PO TBEC: 81.0000 mg | DELAYED_RELEASE_TABLET | Freq: Every day | ORAL | Status: DC

## 2018-05-11 MED ORDER — ONDANSETRON HCL 4 MG/2ML IJ SOLN: 4.0000 mg | Freq: Once | INTRAMUSCULAR | Status: DC | PRN

## 2018-05-11 MED ORDER — PROPOFOL 10 MG/ML IV BOLUS
INTRAVENOUS | Status: AC
  Filled 2018-05-11: qty 20

## 2018-05-11 MED ORDER — SODIUM CHLORIDE 0.9 % IV SOLN
INTRAVENOUS | Status: DC
  Administered 2018-05-11 – 2018-05-14 (×2): via INTRAVENOUS

## 2018-05-11 MED ORDER — BUMETANIDE 1 MG PO TABS
2.0000 mg | ORAL_TABLET | Freq: Two times a day (BID) | ORAL | Status: DC
  Filled 2018-05-11 (×2): qty 2

## 2018-05-11 MED ORDER — ARTIFICIAL TEARS OPHTHALMIC OINT
1.0000 "application " | TOPICAL_OINTMENT | Freq: Three times a day (TID) | OPHTHALMIC | Status: DC
  Administered 2018-05-11 – 2018-05-15 (×11): 1 via OPHTHALMIC
  Filled 2018-05-11 (×2): qty 3.5

## 2018-05-11 MED ORDER — FAMOTIDINE 20 MG PO TABS
20.0000 mg | ORAL_TABLET | Freq: Once | ORAL | Status: AC
  Administered 2018-05-11: 20 mg via ORAL

## 2018-05-11 MED ORDER — SODIUM CHLORIDE 0.9 % IJ SOLN
INTRAMUSCULAR | Status: AC
  Filled 2018-05-11: qty 10

## 2018-05-11 MED ORDER — FLEET ENEMA 7-19 GM/118ML RE ENEM: 1.0000 | ENEMA | Freq: Once | RECTAL | Status: DC | PRN

## 2018-05-11 MED ORDER — HEPARIN SODIUM (PORCINE) 5000 UNIT/ML IJ SOLN
INTRAMUSCULAR | Status: AC
  Filled 2018-05-11: qty 1

## 2018-05-11 MED ORDER — SORBITOL 70 % SOLN
30.0000 mL | Freq: Every day | Status: DC | PRN
  Filled 2018-05-11: qty 30

## 2018-05-11 MED ORDER — DIPHENHYDRAMINE HCL 50 MG/ML IJ SOLN
INTRAMUSCULAR | Status: AC
  Administered 2018-05-11: 25 mg via INTRAVENOUS
  Filled 2018-05-11: qty 1

## 2018-05-11 MED ORDER — MIDAZOLAM HCL 2 MG/2ML IJ SOLN
INTRAMUSCULAR | Status: DC | PRN
  Administered 2018-05-11: 3 mg via INTRAVENOUS

## 2018-05-11 MED ORDER — ONDANSETRON HCL 4 MG/2ML IJ SOLN: 4.0000 mg | Freq: Four times a day (QID) | INTRAMUSCULAR | Status: DC | PRN

## 2018-05-11 MED ORDER — FENTANYL CITRATE (PF) 100 MCG/2ML IJ SOLN
INTRAMUSCULAR | Status: AC
  Filled 2018-05-11: qty 2

## 2018-05-11 MED ORDER — ONDANSETRON HCL 4 MG/2ML IJ SOLN
INTRAMUSCULAR | Status: AC
  Filled 2018-05-11: qty 2

## 2018-05-11 MED ORDER — ACETAMINOPHEN 650 MG RE SUPP: 650.0000 mg | Freq: Four times a day (QID) | RECTAL | Status: DC | PRN

## 2018-05-11 MED ORDER — HEPARIN SODIUM (PORCINE) 5000 UNIT/ML IJ SOLN: 5000.0000 [IU] | Freq: Three times a day (TID) | INTRAMUSCULAR | Status: DC

## 2018-05-11 MED ORDER — BACITRACIN ZINC 500 UNIT/GM EX OINT
TOPICAL_OINTMENT | CUTANEOUS | Status: DC | PRN
  Administered 2018-05-11: 1 via TOPICAL

## 2018-05-11 MED ORDER — EPINEPHRINE PF 1 MG/10ML IJ SOSY
PREFILLED_SYRINGE | INTRAMUSCULAR | Status: DC | PRN
  Administered 2018-05-11: 1 mg via INTRAVENOUS

## 2018-05-11 MED ORDER — DEXTROSE 50 % IV SOLN
INTRAVENOUS | Status: AC
  Administered 2018-05-11: 25 mL via INTRAVENOUS
  Filled 2018-05-11: qty 50

## 2018-05-11 MED ORDER — SODIUM CHLORIDE 0.9 % IV SOLN
INTRAVENOUS | Status: DC
  Administered 2018-05-11 (×2): via INTRAVENOUS

## 2018-05-11 MED ORDER — HYDROMORPHONE HCL 1 MG/ML IJ SOLN: 1.0000 mg | Freq: Once | INTRAMUSCULAR | Status: DC | PRN

## 2018-05-11 MED ORDER — PROPOFOL 10 MG/ML IV BOLUS
INTRAVENOUS | Status: DC | PRN
  Administered 2018-05-11: 140 mg via INTRAVENOUS

## 2018-05-11 MED ORDER — FENTANYL CITRATE (PF) 100 MCG/2ML IJ SOLN
100.0000 ug | Freq: Once | INTRAMUSCULAR | Status: AC
  Administered 2018-05-11: 100 ug via INTRAVENOUS

## 2018-05-11 MED ORDER — MIDAZOLAM HCL 5 MG/5ML IJ SOLN
INTRAMUSCULAR | Status: AC
  Filled 2018-05-11: qty 5

## 2018-05-11 MED ORDER — ROCURONIUM BROMIDE 100 MG/10ML IV SOLN
INTRAVENOUS | Status: DC | PRN
  Administered 2018-05-11: 30 mg via INTRAVENOUS

## 2018-05-11 MED ORDER — MAGNESIUM HYDROXIDE 400 MG/5ML PO SUSP: 30.0000 mL | Freq: Every day | ORAL | Status: DC | PRN

## 2018-05-11 MED ORDER — CEFAZOLIN SODIUM-DEXTROSE 1-4 GM/50ML-% IV SOLN
1.0000 g | Freq: Three times a day (TID) | INTRAVENOUS | Status: AC
  Administered 2018-05-12: 1 g via INTRAVENOUS
  Filled 2018-05-11 (×2): qty 50

## 2018-05-11 MED ORDER — CLOPIDOGREL BISULFATE 75 MG PO TABS
75.0000 mg | ORAL_TABLET | Freq: Every day | ORAL | Status: DC
  Administered 2018-05-12 – 2018-05-25 (×13): 75 mg via ORAL
  Filled 2018-05-11 (×13): qty 1

## 2018-05-11 MED ORDER — PROPOFOL 1000 MG/100ML IV EMUL
25.0000 ug/kg/min | INTRAVENOUS | Status: DC
  Administered 2018-05-11: 25 ug/kg/min via INTRAVENOUS
  Administered 2018-05-12: 75 ug/kg/min via INTRAVENOUS
  Administered 2018-05-12: 70 ug/kg/min via INTRAVENOUS
  Administered 2018-05-12: 50 ug/kg/min via INTRAVENOUS
  Administered 2018-05-12 (×2): 60 ug/kg/min via INTRAVENOUS
  Administered 2018-05-12 (×2): 75 ug/kg/min via INTRAVENOUS
  Administered 2018-05-13 (×3): 50 ug/kg/min via INTRAVENOUS
  Filled 2018-05-11 (×11): qty 100

## 2018-05-11 MED ORDER — CLINDAMYCIN PHOSPHATE 900 MG/50ML IV SOLN
INTRAVENOUS | Status: AC
  Filled 2018-05-11: qty 50

## 2018-05-11 MED ORDER — CEFAZOLIN SODIUM 1 G IJ SOLR
INTRAMUSCULAR | Status: AC
  Filled 2018-05-11: qty 10

## 2018-05-11 MED ORDER — FENTANYL BOLUS VIA INFUSION
50.0000 ug | INTRAVENOUS | Status: DC | PRN
  Administered 2018-05-14 – 2018-05-15 (×3): 50 ug via INTRAVENOUS
  Filled 2018-05-11: qty 50

## 2018-05-11 MED ORDER — CEFAZOLIN SODIUM-DEXTROSE 2-4 GM/100ML-% IV SOLN
INTRAVENOUS | Status: AC
  Filled 2018-05-11: qty 100

## 2018-05-11 MED ORDER — CARVEDILOL PHOSPHATE ER 40 MG PO CP24
40.0000 mg | ORAL_CAPSULE | Freq: Every day | ORAL | Status: DC
  Filled 2018-05-11 (×3): qty 1

## 2018-05-11 MED ORDER — LIDOCAINE HCL (CARDIAC) PF 100 MG/5ML IV SOSY
PREFILLED_SYRINGE | INTRAVENOUS | Status: DC | PRN
  Administered 2018-05-11: 100 mg via INTRAVENOUS

## 2018-05-11 MED ORDER — FENTANYL CITRATE (PF) 100 MCG/2ML IJ SOLN: 25.0000 ug | INTRAMUSCULAR | Status: DC | PRN

## 2018-05-11 MED ORDER — HYDRALAZINE HCL 20 MG/ML IJ SOLN
10.0000 mg | INTRAMUSCULAR | Status: DC | PRN
  Administered 2018-05-11 – 2018-05-15 (×3): 10 mg via INTRAVENOUS
  Filled 2018-05-11 (×4): qty 1

## 2018-05-11 MED ORDER — DILTIAZEM HCL ER COATED BEADS 120 MG PO TB24
120.0000 mg | ORAL_TABLET | Freq: Every day | ORAL | Status: DC
  Administered 2018-05-13 – 2018-05-25 (×12): 120 mg via ORAL
  Filled 2018-05-11 (×22): qty 1

## 2018-05-11 MED ORDER — ASPIRIN 300 MG RE SUPP: 300.0000 mg | RECTAL | Status: AC

## 2018-05-11 MED ORDER — CLOPIDOGREL BISULFATE 75 MG PO TABS: 300.0000 mg | ORAL_TABLET | ORAL | Status: AC

## 2018-05-11 MED ORDER — ONDANSETRON HCL 4 MG PO TABS: 4.0000 mg | ORAL_TABLET | Freq: Four times a day (QID) | ORAL | Status: DC | PRN

## 2018-05-11 MED ORDER — GLYCOPYRROLATE 0.2 MG/ML IJ SOLN
INTRAMUSCULAR | Status: DC | PRN
  Administered 2018-05-11: .8 mg via INTRAVENOUS

## 2018-05-11 MED ORDER — HYDROCODONE-ACETAMINOPHEN 5-325 MG PO TABS: 1.0000 | ORAL_TABLET | ORAL | Status: DC | PRN

## 2018-05-11 MED ORDER — FENTANYL 2500MCG IN NS 250ML (10MCG/ML) PREMIX INFUSION
INTRAVENOUS | Status: AC
  Administered 2018-05-11: 25 ug/h via INTRAVENOUS
  Filled 2018-05-11: qty 250

## 2018-05-11 MED ORDER — GLYCOPYRROLATE 0.2 MG/ML IJ SOLN
INTRAMUSCULAR | Status: AC
  Filled 2018-05-11: qty 4

## 2018-05-11 MED ORDER — ATORVASTATIN CALCIUM 20 MG PO TABS
40.0000 mg | ORAL_TABLET | Freq: Every day | ORAL | Status: DC
  Administered 2018-05-12 – 2018-05-25 (×13): 40 mg via ORAL
  Filled 2018-05-11 (×13): qty 2

## 2018-05-11 MED ORDER — FAMOTIDINE 20 MG PO TABS
ORAL_TABLET | ORAL | Status: AC
  Administered 2018-05-11: 20 mg via ORAL
  Filled 2018-05-11: qty 1

## 2018-05-11 MED ORDER — PROMETHAZINE HCL 25 MG/ML IJ SOLN
INTRAMUSCULAR | Status: AC
  Administered 2018-05-11: 6.25 mg via INTRAVENOUS
  Filled 2018-05-11: qty 1

## 2018-05-11 MED ORDER — SUCCINYLCHOLINE CHLORIDE 20 MG/ML IJ SOLN
INTRAMUSCULAR | Status: DC | PRN
  Administered 2018-05-11: 100 mg via INTRAVENOUS
  Administered 2018-05-11: 80 mg via INTRAVENOUS

## 2018-05-11 MED ORDER — ONDANSETRON HCL 4 MG/2ML IJ SOLN
INTRAMUSCULAR | Status: DC | PRN
  Administered 2018-05-11: 4 mg via INTRAVENOUS

## 2018-05-11 MED ORDER — PHENYLEPHRINE HCL 10 MG/ML IJ SOLN
INTRAMUSCULAR | Status: DC | PRN
  Administered 2018-05-11: 100 ug via INTRAVENOUS
  Administered 2018-05-11: 50 ug via INTRAVENOUS
  Administered 2018-05-11: 100 ug via INTRAVENOUS
  Administered 2018-05-11 (×2): 50 ug via INTRAVENOUS
  Administered 2018-05-11: 150 ug via INTRAVENOUS
  Administered 2018-05-11 (×2): 100 ug via INTRAVENOUS
  Administered 2018-05-11: 50 ug via INTRAVENOUS

## 2018-05-11 MED ORDER — BACITRACIN ZINC 500 UNIT/GM EX OINT
TOPICAL_OINTMENT | CUTANEOUS | Status: AC
  Filled 2018-05-11: qty 28.35

## 2018-05-11 MED ORDER — FENTANYL CITRATE (PF) 100 MCG/2ML IJ SOLN
INTRAMUSCULAR | Status: DC | PRN
  Administered 2018-05-11: 50 ug via INTRAVENOUS

## 2018-05-11 MED ORDER — ACETAMINOPHEN 325 MG PO TABS
650.0000 mg | ORAL_TABLET | Freq: Four times a day (QID) | ORAL | Status: DC | PRN
  Administered 2018-05-17: 650 mg via ORAL
  Filled 2018-05-11: qty 2

## 2018-05-11 MED ORDER — DOCUSATE SODIUM 100 MG PO CAPS: 100.0000 mg | ORAL_CAPSULE | Freq: Two times a day (BID) | ORAL | Status: DC

## 2018-05-11 MED ORDER — DEXTROSE 50 % IV SOLN
25.0000 mL | Freq: Once | INTRAVENOUS | Status: AC
  Administered 2018-05-11: 25 mL via INTRAVENOUS

## 2018-05-11 MED ORDER — FENTANYL 2500MCG IN NS 250ML (10MCG/ML) PREMIX INFUSION
100.0000 ug/h | INTRAVENOUS | Status: DC
  Administered 2018-05-11: 25 ug/h via INTRAVENOUS
  Administered 2018-05-12 – 2018-05-13 (×3): 250 ug/h via INTRAVENOUS
  Administered 2018-05-13: 125 ug/h via INTRAVENOUS
  Administered 2018-05-14: 300 ug/h via INTRAVENOUS
  Administered 2018-05-14 (×2): 250 ug/h via INTRAVENOUS
  Administered 2018-05-15: 300 ug/h via INTRAVENOUS
  Filled 2018-05-11 (×8): qty 250

## 2018-05-11 MED ORDER — CEFAZOLIN SODIUM-DEXTROSE 1-4 GM/50ML-% IV SOLN
INTRAVENOUS | Status: DC | PRN
  Administered 2018-05-11: 1 g via INTRAVENOUS

## 2018-05-11 MED ORDER — CISATRACURIUM BOLUS VIA INFUSION
0.0500 mg/kg | INTRAVENOUS | Status: DC | PRN
  Filled 2018-05-11: qty 4

## 2018-05-11 MED ORDER — NOREPINEPHRINE 4 MG/250ML-% IV SOLN
0.0000 ug/min | INTRAVENOUS | Status: DC
  Administered 2018-05-11 – 2018-05-13 (×2): 2 ug/min via INTRAVENOUS
  Filled 2018-05-11: qty 250

## 2018-05-11 MED ORDER — SODIUM CHLORIDE 0.9 % IV SOLN
1.0000 ug/kg/min | INTRAVENOUS | Status: DC
  Administered 2018-05-12: 1 ug/kg/min via INTRAVENOUS
  Filled 2018-05-11: qty 20

## 2018-05-11 MED ORDER — FAMOTIDINE IN NACL 20-0.9 MG/50ML-% IV SOLN
20.0000 mg | Freq: Two times a day (BID) | INTRAVENOUS | Status: DC
  Administered 2018-05-11: 20 mg via INTRAVENOUS
  Filled 2018-05-11: qty 50

## 2018-05-11 MED ORDER — SODIUM CHLORIDE FLUSH 0.9 % IV SOLN
INTRAVENOUS | Status: AC
  Filled 2018-05-11: qty 10

## 2018-05-11 MED ORDER — SODIUM BICARBONATE 8.4 % IV SOLN
150.0000 meq | Freq: Once | INTRAVENOUS | Status: AC
  Administered 2018-05-11: 150 meq via INTRAVENOUS
  Filled 2018-05-11: qty 150

## 2018-05-11 MED ORDER — ATROPINE SULFATE 0.4 MG/ML IJ SOLN
INTRAMUSCULAR | Status: DC | PRN
  Administered 2018-05-11: 0.4 mg via INTRAVENOUS

## 2018-05-11 MED ORDER — DIPHENHYDRAMINE HCL 50 MG/ML IJ SOLN
25.0000 mg | Freq: Once | INTRAMUSCULAR | Status: AC
  Administered 2018-05-11: 25 mg via INTRAVENOUS

## 2018-05-11 MED ORDER — DEXTROSE 50 % IV SOLN
INTRAVENOUS | Status: AC
  Filled 2018-05-11: qty 50

## 2018-05-11 SURGICAL SUPPLY — 59 items
APPLIER CLIP 11 MED OPEN (CLIP)
APPLIER CLIP 9.375 SM OPEN (CLIP)
BAG COUNTER SPONGE EZ (MISCELLANEOUS) ×2 IMPLANT
BAG DECANTER FOR FLEXI CONT (MISCELLANEOUS) ×2 IMPLANT
BLADE SURG SZ11 CARB STEEL (BLADE) ×2 IMPLANT
BOOT SUTURE AID YELLOW STND (SUTURE) ×2 IMPLANT
BRUSH SCRUB EZ  4% CHG (MISCELLANEOUS) ×1
BRUSH SCRUB EZ 4% CHG (MISCELLANEOUS) ×1 IMPLANT
CANISTER SUCT 1200ML W/VALVE (MISCELLANEOUS) ×2 IMPLANT
CHLORAPREP W/TINT 26ML (MISCELLANEOUS) ×2 IMPLANT
CLIP APPLIE 11 MED OPEN (CLIP) IMPLANT
CLIP APPLIE 9.375 SM OPEN (CLIP) IMPLANT
DERMABOND ADVANCED (GAUZE/BANDAGES/DRESSINGS) ×1
DERMABOND ADVANCED .7 DNX12 (GAUZE/BANDAGES/DRESSINGS) ×1 IMPLANT
DRESSING SURGICEL FIBRLLR 1X2 (HEMOSTASIS) ×1 IMPLANT
DRSG SURGICEL FIBRILLAR 1X2 (HEMOSTASIS) ×2
ELECT CAUTERY BLADE 6.4 (BLADE) ×2 IMPLANT
ELECT REM PT RETURN 9FT ADLT (ELECTROSURGICAL) ×2
ELECTRODE REM PT RTRN 9FT ADLT (ELECTROSURGICAL) ×1 IMPLANT
GLOVE BIO SURGEON STRL SZ7 (GLOVE) ×2 IMPLANT
GLOVE INDICATOR 7.5 STRL GRN (GLOVE) ×2 IMPLANT
GLOVE SURG SYN 8.0 (GLOVE) ×2 IMPLANT
GLOVE SURG SYN 8.0 PF PI (GLOVE) ×1 IMPLANT
GOWN STRL REUS W/ TWL LRG LVL3 (GOWN DISPOSABLE) ×2 IMPLANT
GOWN STRL REUS W/ TWL XL LVL3 (GOWN DISPOSABLE) ×1 IMPLANT
GOWN STRL REUS W/TWL LRG LVL3 (GOWN DISPOSABLE) ×2
GOWN STRL REUS W/TWL XL LVL3 (GOWN DISPOSABLE) ×1
IV NS 500ML (IV SOLUTION) ×1
IV NS 500ML BAXH (IV SOLUTION) ×1 IMPLANT
KIT TURNOVER KIT A (KITS) ×2 IMPLANT
LABEL OR SOLS (LABEL) ×2 IMPLANT
LOOP RED MAXI  1X406MM (MISCELLANEOUS) ×1
LOOP VESSEL MAXI 1X406 RED (MISCELLANEOUS) ×1 IMPLANT
LOOP VESSEL MINI 0.8X406 BLUE (MISCELLANEOUS) ×2 IMPLANT
LOOPS BLUE MINI 0.8X406MM (MISCELLANEOUS) ×2
NDL FILTER BLUNT 18X1 1/2 (NEEDLE) ×1 IMPLANT
NEEDLE FILTER BLUNT 18X 1/2SAF (NEEDLE) ×1
NEEDLE FILTER BLUNT 18X1 1/2 (NEEDLE) ×1 IMPLANT
NS IRRIG 500ML POUR BTL (IV SOLUTION) ×2 IMPLANT
PACK EXTREMITY ARMC (MISCELLANEOUS) ×2 IMPLANT
PAD PREP 24X41 OB/GYN DISP (PERSONAL CARE ITEMS) ×2 IMPLANT
PUNCH SURGICAL ROTATE 2.7MM (MISCELLANEOUS) IMPLANT
STOCKINETTE 48X4 2 PLY STRL (GAUZE/BANDAGES/DRESSINGS) ×1 IMPLANT
STOCKINETTE STRL 4IN 9604848 (GAUZE/BANDAGES/DRESSINGS) ×2 IMPLANT
SUT GTX CV-6 30 (SUTURE) ×4 IMPLANT
SUT MNCRL+ 5-0 UNDYED PC-3 (SUTURE) ×1 IMPLANT
SUT MONOCRYL 5-0 (SUTURE) ×1
SUT PROLENE 6 0 BV (SUTURE) ×4 IMPLANT
SUT SILK 2 0 (SUTURE) ×1
SUT SILK 2 0 SH (SUTURE) ×2 IMPLANT
SUT SILK 2-0 18XBRD TIE 12 (SUTURE) ×1 IMPLANT
SUT SILK 3 0 (SUTURE) ×1
SUT SILK 3-0 18XBRD TIE 12 (SUTURE) ×1 IMPLANT
SUT SILK 4 0 (SUTURE) ×1
SUT SILK 4-0 18XBRD TIE 12 (SUTURE) ×1 IMPLANT
SUT VIC AB 3-0 SH 27 (SUTURE) ×2
SUT VIC AB 3-0 SH 27X BRD (SUTURE) ×2 IMPLANT
SYR 20CC LL (SYRINGE) ×2 IMPLANT
SYR 3ML LL SCALE MARK (SYRINGE) ×2 IMPLANT

## 2018-05-11 SURGICAL SUPPLY — 59 items
APPLIER CLIP 11 MED OPEN (CLIP)
APPLIER CLIP 9.375 SM OPEN (CLIP)
BAG COUNTER SPONGE EZ (MISCELLANEOUS) IMPLANT
BAG DECANTER FOR FLEXI CONT (MISCELLANEOUS) ×3 IMPLANT
BLADE SURG SZ11 CARB STEEL (BLADE) ×3 IMPLANT
BOOT SUTURE AID YELLOW STND (SUTURE) ×3 IMPLANT
BRUSH SCRUB EZ  4% CHG (MISCELLANEOUS) ×1
BRUSH SCRUB EZ 4% CHG (MISCELLANEOUS) ×2 IMPLANT
CANISTER SUCT 1200ML W/VALVE (MISCELLANEOUS) ×3 IMPLANT
CATH EMBOLECTOMY 2X60 (CATHETERS) ×3 IMPLANT
CHLORAPREP W/TINT 26ML (MISCELLANEOUS) ×3 IMPLANT
CLIP APPLIE 11 MED OPEN (CLIP) IMPLANT
CLIP APPLIE 9.375 SM OPEN (CLIP) IMPLANT
DERMABOND ADVANCED (GAUZE/BANDAGES/DRESSINGS) ×1
DERMABOND ADVANCED .7 DNX12 (GAUZE/BANDAGES/DRESSINGS) ×2 IMPLANT
DRESSING SURGICEL FIBRLLR 1X2 (HEMOSTASIS) ×2 IMPLANT
DRSG SURGICEL FIBRILLAR 1X2 (HEMOSTASIS) ×3
DRSG TEGADERM 4X4.75 (GAUZE/BANDAGES/DRESSINGS) ×3 IMPLANT
DRSG TELFA 4X3 1S NADH ST (GAUZE/BANDAGES/DRESSINGS) ×3 IMPLANT
ELECT CAUTERY BLADE 6.4 (BLADE) ×3 IMPLANT
ELECT REM PT RETURN 9FT ADLT (ELECTROSURGICAL) ×3
ELECTRODE REM PT RTRN 9FT ADLT (ELECTROSURGICAL) ×2 IMPLANT
GLOVE BIO SURGEON STRL SZ7 (GLOVE) ×3 IMPLANT
GLOVE INDICATOR 7.5 STRL GRN (GLOVE) ×3 IMPLANT
GLOVE SURG SYN 8.0 (GLOVE) ×3 IMPLANT
GOWN STRL REUS W/ TWL LRG LVL3 (GOWN DISPOSABLE) ×4 IMPLANT
GOWN STRL REUS W/ TWL XL LVL3 (GOWN DISPOSABLE) ×2 IMPLANT
GOWN STRL REUS W/TWL LRG LVL3 (GOWN DISPOSABLE) ×2
GOWN STRL REUS W/TWL XL LVL3 (GOWN DISPOSABLE) ×1
IV NS 500ML (IV SOLUTION) ×1
IV NS 500ML BAXH (IV SOLUTION) ×2 IMPLANT
KIT TURNOVER KIT A (KITS) ×3 IMPLANT
LABEL OR SOLS (LABEL) IMPLANT
LOOP RED MAXI  1X406MM (MISCELLANEOUS) ×1
LOOP VESSEL MAXI 1X406 RED (MISCELLANEOUS) ×2 IMPLANT
LOOP VESSEL MINI 0.8X406 BLUE (MISCELLANEOUS) ×4 IMPLANT
LOOPS BLUE MINI 0.8X406MM (MISCELLANEOUS) ×2
NEEDLE FILTER BLUNT 18X 1/2SAF (NEEDLE) ×1
NEEDLE FILTER BLUNT 18X1 1/2 (NEEDLE) ×2 IMPLANT
NS IRRIG 500ML POUR BTL (IV SOLUTION) ×3 IMPLANT
PACK EXTREMITY ARMC (MISCELLANEOUS) ×3 IMPLANT
PAD PREP 24X41 OB/GYN DISP (PERSONAL CARE ITEMS) ×3 IMPLANT
PUNCH SURGICAL ROTATE 2.7MM (MISCELLANEOUS) IMPLANT
STOCKINETTE STRL 4IN 9604848 (GAUZE/BANDAGES/DRESSINGS) ×3 IMPLANT
SUT GTX CV-6 30 (SUTURE) ×6 IMPLANT
SUT MNCRL+ 5-0 UNDYED PC-3 (SUTURE) ×2 IMPLANT
SUT MONOCRYL 5-0 (SUTURE) ×1
SUT PROLENE 6 0 BV (SUTURE) ×6 IMPLANT
SUT SILK 2 0 (SUTURE) ×1
SUT SILK 2 0 SH (SUTURE) ×3 IMPLANT
SUT SILK 2-0 18XBRD TIE 12 (SUTURE) ×2 IMPLANT
SUT SILK 3 0 (SUTURE) ×1
SUT SILK 3-0 18XBRD TIE 12 (SUTURE) ×2 IMPLANT
SUT SILK 4 0 (SUTURE) ×1
SUT SILK 4-0 18XBRD TIE 12 (SUTURE) ×2 IMPLANT
SUT VIC AB 3-0 SH 27 (SUTURE) ×2
SUT VIC AB 3-0 SH 27X BRD (SUTURE) ×4 IMPLANT
SYR 20CC LL (SYRINGE) ×3 IMPLANT
SYR 3ML LL SCALE MARK (SYRINGE) IMPLANT

## 2018-05-11 NOTE — Transfer of Care (Signed)
Immediate Anesthesia Transfer of Care Note  Patient: Tracy Jennings  Procedure(s) Performed: ARTERIOVENOUS (AV) FISTULA CREATION (Left )  Patient Location: PACU  Anesthesia Type:General  Level of Consciousness: sedated  Airway & Oxygen Therapy: Patient Spontanous Breathing and Patient connected to face mask oxygen  Post-op Assessment: Report given to RN and Post -op Vital signs reviewed and stable  Post vital signs: Reviewed and stable  Last Vitals:  Vitals Value Taken Time  BP 100/63 05/11/2018 12:27 PM  Temp    Pulse 65 05/11/2018 12:27 PM  Resp 10 05/11/2018 12:27 PM  SpO2 99 % 05/11/2018 12:27 PM  Vitals shown include unvalidated device data.  Last Pain:  Vitals:   05/11/18 0836  TempSrc: Temporal  PainSc: 0-No pain         Complications: No apparent anesthesia complications

## 2018-05-11 NOTE — Anesthesia Procedure Notes (Signed)
Procedure Name: Intubation Performed by: Lance Muss, CRNA Pre-anesthesia Checklist: Patient identified, Patient being monitored, Timeout performed, Emergency Drugs available and Suction available Patient Re-evaluated:Patient Re-evaluated prior to induction Oxygen Delivery Method: Circle system utilized Preoxygenation: Pre-oxygenation with 100% oxygen Induction Type: IV induction Ventilation: Mask ventilation without difficulty Laryngoscope Size: Mac and 3 Grade View: Grade I Tube type: Oral Tube size: 7.0 mm Number of attempts: 1 Airway Equipment and Method: Stylet Placement Confirmation: ETT inserted through vocal cords under direct vision,  positive ETCO2 and breath sounds checked- equal and bilateral Secured at: 21 cm Tube secured with: Tape Dental Injury: Teeth and Oropharynx as per pre-operative assessment

## 2018-05-11 NOTE — Anesthesia Post-op Follow-up Note (Signed)
Anesthesia QCDR form completed.        

## 2018-05-11 NOTE — Progress Notes (Signed)
No bruit or thrill noted  Dr Delana Meyer and has no new orders

## 2018-05-11 NOTE — Consult Note (Addendum)
Name: Tracy Jennings MRN: [id number] DOB: Feb 12, 1956     CONSULTATION DATE: 05/11/2018    HISTORY OF PRESENT ILLNESS:    Patient underwent left arm AV fistula for dialysis access earlier today.  Later she was found there was no thrill and had to go back to the OR for refashioning.  Patient suffered a cardiac arrest after being extubated in the OR, on reported downtime, she was reintubated during ACLS after receiving succinylcholine. All history was obtained from the nursing staff, EMR. Patient arrived to ICU on the ventilator, no distress and unresponsive. Follow with code sheet, H&P and detailed medical history.  PAST MEDICAL HISTORY :   has a past medical history of Anemia (04/2018), CKD (chronic kidney disease), Diabetes mellitus without complication ([place]), ESRD (end stage renal disease) ([place]), Heart murmur, Hyperlipidemia associated with type 2 diabetes mellitus ([place]), Hypertension, Pancreatitis, and Peripheral vascular disease ([place]).  has a past surgical history that includes Cataract extraction; Amputation toe (Left, 2013); Colonoscopy; Colonoscopy with propofol (N/A, 01/10/2018); Eye surgery (Bilateral, 2018); Cholecystectomy (2014); and Tubal ligation (1984). Prior to Admission medications   Medication Sig Start Date End Date Taking? Authorizing Provider  atorvastatin (LIPITOR) 40 MG tablet Take 40 mg by mouth daily at 6 PM.  10/05/15 05/11/18 Yes [provider]  bumetanide (BUMEX) 1 MG tablet Take 2 mg by mouth 2 (two) times daily.  02/28/18 02/28/19 Yes [provider]  carvedilol (COREG CR) 40 MG 24 hr capsule Take 40 mg by mouth daily. Takes in the morning when she gets home from work 11/06/17  Yes [provider]  diltiazem (CARDIZEM LA) 120 MG 24 hr tablet Take 1 tablet (120 mg total) by mouth daily. Patient taking differently: Take 120 mg by mouth daily. Takes in the morning when she gets home from work 08/08/17  Yes Cuthriell, Charline Bills, PA-C    fluticasone (FLONASE) 50 MCG/ACT nasal spray 2 sprays by Each Nare route daily. 01/15/18 01/15/19 Yes [provider]  hydrALAZINE (APRESOLINE) 50 MG tablet Take 50 mg by mouth every 8 (eight) hours.  10/24/17  Yes [provider]  Insulin Glargine 300 UNIT/ML SOPN Inject 40 Units into the skin daily. In the morning.  (toujeo) 11/30/15  Yes [provider]  amLODipine (NORVASC) 5 MG tablet Take 1 tablet by mouth once daily 01/18/16   [provider]  aspirin EC 81 MG tablet Take 81 mg by mouth daily. 09/27/17   [provider]  HYDROcodone-acetaminophen (NORCO) 5-325 MG tablet Take 1-2 tablets by mouth every 6 (six) hours as needed for moderate pain or severe pain. 05/11/18   Schnier, Dolores Lory, MD  insulin aspart (NOVOLOG) 100 UNIT/ML FlexPen Inject 10-12 Units into the skin 3 (three) times daily with meals.  12/28/15   [provider]  losartan (COZAAR) 100 MG tablet Take 1 tablet by mouth once daily 01/18/16   [provider]  metoprolol (LOPRESSOR) 50 MG tablet Take 50 mg by mouth daily.  02/20/15   [provider]  ondansetron (ZOFRAN) 4 MG tablet Take 1 tablet (4 mg total) by mouth every 6 (six) hours as needed for nausea. Patient not taking: Reported on 05/04/2018 11/03/17   Epifanio Lesches, MD  oxyCODONE (OXY IR/ROXICODONE) 5 MG immediate release tablet Take 1 tablet (5 mg total) by mouth every 4 (four) hours as needed for moderate pain. Patient not taking: Reported on 05/04/2018 11/03/17   Epifanio Lesches, MD  spironolactone (ALDACTONE) 50 MG tablet Take 50 mg by  mouth daily. 09/27/17   [provider]  Vitamin D, Ergocalciferol, (DRISDOL) 50000 units CAPS capsule TAKE ONE CAPSULE BY MOUTH ONE TIME PER WEEK 01/06/18   [provider]   Allergies  Allergen Reactions  . Hctz [Hydrochlorothiazide]     pancreatitis  . Amlodipine Swelling    Knees down to ankles  . Amlodipine Besylate Swelling  . Sulfa  Antibiotics Rash  . Dairy Aid [Lactase]     Runny nose    FAMILY HISTORY:  family history includes Cancer in her father; Diabetes in her maternal grandfather, maternal grandmother, paternal grandfather, paternal grandmother, and sister; Hypertension in her mother; Stroke in her mother. SOCIAL HISTORY:  reports that she has never smoked. She has never used smokeless tobacco. She reports that she does not drink alcohol or use drugs.  REVIEW OF SYSTEMS:   Unable to obtain due to critical illness   VITAL SIGNS: Temp:  [97.2 F (36.2 C)-99 F (37.2 C)] 99 F (37.2 C) (08/30 1541) Pulse Rate:  [65-85] 74 (08/30 1804) Resp:  [8-23] 14 (08/30 1551) BP: (100-211)/(53-91) 112/78 (08/30 1804) SpO2:  [94 %-100 %] 100 % (08/30 1804) FiO2 (%):  [50 %] 50 % (08/30 1750) Weight:  [80.4 kg] 80.4 kg (08/30 0836)  Physical Examination:  Unresponsive, no corneal, no doll's, no DTR, no gag, no spontaneous movement and no triggering the ventilator On vent, no distress, bilateral equal air entry and no adventitious sounds S1 & S2 are audible with no murmur Benign abdominal exam with feeble peristalsis Dress left arm newly created AV fistula, no thrill palpable No edema   ASSESSMENT / PLAN:  Acute respiratory failure. 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 -Monitor neuro status, patient received succinylcholine as reported by nursing unit for intubation intubation and ACLS during resuscitation. -CT head without contrast to rule out acute intracranial abnormalities -Consider induced hypothermia  Cardiac arrest in OR after refashioning of left arm AV fistula created for dialysis -Monitor EKG, echocardiogram and cardiac enzymes  CHF with pulmonary congestion on chest x-ray post intubation -Diuresis/RRT as per renal to improve lung  compliance.  End-stage renal disease. S/p placement of Lt. Arm AVF  -Stat renal panel, concern for hyperkalemia as the patient received succinylcholine for intubation with baseline history end-stage renal disease. -Management as per renal  Left arm AV fistula. -Management as per vascular  Diabetes mellitus -Glycemic control  Dyslipidemia -Statin  Anemia -Hemoglobin more than 8 g/dL  Full code  DVT and GI prophylaxis.  Continue with supportive care  Critical care time 40 minutes   21:35  I met with Mr Adamik and the [place]. 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 He agreed to the plan of care.

## 2018-05-11 NOTE — Progress Notes (Addendum)
While talking to the patient's husband regarding the thrombectomy of her fistula I heard an overhead code called for Tracy Jennings.  I immediately returned to the room.  The patient had been transferred to the bed off the OR table and arrested.  When I entered she was undergoing chest compressions and it gotten at least one round of epi.  Reportedly she had become apneic and hypoxic as well as markedly bradycardic into the 20s.  At the time of my arrival checking for pulse she did indeed have a carotid pulse with a sinus tachycardia and a blood pressure of approximately 038 systolic likely secondary to the epinephrine effects.  Unfortunately she did not appear to have respiratory effort that would be adequate and it was decided to reintubate her for appropriate airway control.  She will be transferred to the ICU for supportive care.  I have called the ICU physician on-call I did not receive an answer my call went to voicemail but his voicemail was full and I could not leave a message.

## 2018-05-11 NOTE — H&P (Signed)
[facility] History & Physical Update  The patient was interviewed and re-examined.  The patient's previous History and Physical has been reviewed and is unchanged.  There is no change in the plan of care. We plan to proceed with the scheduled procedure.  Hortencia Pilar, MD  05/11/2018, 10:20 AM

## 2018-05-11 NOTE — Anesthesia Procedure Notes (Addendum)
Procedure Name: Intubation Performed by: Lance Muss, CRNA Pre-anesthesia Checklist: Patient identified, Patient being monitored, Timeout performed, Emergency Drugs available and Suction available Patient Re-evaluated:Patient Re-evaluated prior to induction Oxygen Delivery Method: Circle system utilized Preoxygenation: Pre-oxygenation with 100% oxygen Induction Type: IV induction, Rapid sequence and Cricoid Pressure applied Ventilation: Mask ventilation without difficulty Laryngoscope Size: Mac and 3 Grade View: Grade I Tube type: Oral Tube size: 7.0 mm Number of attempts: 1 Airway Equipment and Method: Stylet Placement Confirmation: ETT inserted through vocal cords under direct vision,  positive ETCO2 and breath sounds checked- equal and bilateral Secured at: 21 cm Tube secured with: Tape Dental Injury: Teeth and Oropharynx as per pre-operative assessment

## 2018-05-11 NOTE — Anesthesia Preprocedure Evaluation (Signed)
Anesthesia Evaluation  Patient identified by MRN, date of birth, ID band Patient awake    Reviewed: Allergy & Precautions, NPO status , Patient's Chart, lab work & pertinent test results  History of Anesthesia Complications Negative for: history of anesthetic complications  Airway Mallampati: III       Dental   Pulmonary neg pulmonary ROS, neg sleep apnea, neg COPD,    Pulmonary exam normal        Cardiovascular hypertension, Pt. on medications + Peripheral Vascular Disease  (-) Past MI and (-) CHF Normal cardiovascular exam(-) dysrhythmias + Valvular Problems/Murmurs      Neuro/Psych neg Seizures negative neurological ROS  negative psych ROS   GI/Hepatic Neg liver ROS, neg GERD  ,  Endo/Other  diabetes, Type 2, Insulin Dependent  Renal/GU Renal InsufficiencyRenal disease     Musculoskeletal negative musculoskeletal ROS (+)   Abdominal Normal abdominal exam  (+)   Peds  Hematology  (+) anemia ,   Anesthesia Other Findings Past Medical History: 04/2018: Anemia     Comment:  low iron. 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 No date: Diabetes mellitus without complication (HCC)     Comment:  type II No date: ESRD (end stage renal disease) (HCC) No date: Heart murmur     Comment:  followed as a child only No date: Hyperlipidemia associated with type 2 diabetes mellitus ([place]) No date: Hypertension No date: Pancreatitis No date: Peripheral vascular disease (HCC)  Reproductive/Obstetrics                             Anesthesia Physical  Anesthesia Plan  ASA: III  Anesthesia Plan: General   Post-op Pain Management:    Induction: Intravenous  PONV Risk Score and Plan: 3  Airway Management Planned: Oral ETT  Additional Equipment:   Intra-op Plan:   Post-operative Plan: Extubation in OR  Informed Consent: I have reviewed the  patients History and Physical, chart, labs and discussed the procedure including the risks, benefits and alternatives for the proposed anesthesia with the patient or authorized representative who has indicated his/her understanding and acceptance.     Plan Discussed with: CRNA and Surgeon  Anesthesia Plan Comments:         Anesthesia Quick Evaluation

## 2018-05-11 NOTE — Progress Notes (Signed)
Husband at bedside.  

## 2018-05-11 NOTE — Progress Notes (Signed)
PHARMACY NOTE -  RENAL DOSE ADJUSTMENT   Pharmacy to assist with medicaiton renal dose adjustment.   Patient has been initiated on famotidine 20mg  IV every 12 hours.   SCr 5.49, estimated CrCl 11.1 ml/min- Patient with PMH of ESRD  Current dosage is not appropriate. The dose has been adjusted to Famotidine 20mg  IV every 24 hours.   Pernell Dupre, PharmD, BCPS Clinical Pharmacist 05/12/2018 12:01 AM

## 2018-05-11 NOTE — Progress Notes (Signed)
To surgery

## 2018-05-11 NOTE — Op Note (Signed)
     OPERATIVE NOTE   PROCEDURE: left brachial cephalic arteriovenous fistula placement  PRE-OPERATIVE DIAGNOSIS: End Stage Renal Disease  POST-OPERATIVE DIAGNOSIS: End Stage Renal Disease  SURGEON: Hortencia Pilar  ASSISTANT(S): Ms. Hezzie Bump  ANESTHESIA: general  ESTIMATED BLOOD LOSS: <50 cc  FINDING(S): 4 mm cephalic vein which was patent all the way to the deltoid by duplex ultrasound preoperatively  SPECIMEN(S):  none  INDICATIONS:   Tracy Jennings is a 62 y.o. female who presents with end stage renal disease.  The patient is scheduled for left brachiocephalic arteriovenous fistula placement.  The patient is aware the risks include but are not limited to: bleeding, infection, steal syndrome, nerve damage, ischemic monomelic neuropathy, failure to mature, and need for additional procedures.  The patient is aware of the risks of the procedure and elects to proceed forward.  DESCRIPTION: After full informed written consent was obtained from the patient, the patient was brought back to the operating room and placed supine upon the operating table.  Prior to induction, the patient received IV antibiotics.   After obtaining adequate anesthesia, the patient was then prepped and draped in the standard fashion for a left arm access procedure.    A curvilinear incision was then created midway between the radial impulse and the cephalic vein. The cephalic vein was then identified and dissected circumferentially. It was marked with a surgical marker.    Attention was then turned to the brachial artery which was exposed through the same incision and looped proximally and distally. Side branches were controlled with 4-0 silk ties.  The distal segment of the vein was ligated with a  2-0 silk, and the vein was transected.  The proximal segment was interrogated with serial dilators.  The vein accepted up to a 4 mm dilator without any difficulty. Heparinized saline was infused into the  vein and clamped it with a small bulldog.  At this point, I reset my exposure of the brachial artery and controlled the artery with vessel loops proximally and distally.  An arteriotomy was then made with a #11 blade, and extended with a Potts scissor.  Heparinized saline was injected proximal and distal into the radial artery.  The vein was then approximated to the artery while the artery was in its native bed and subsequently the vein was beveled using Potts scissors. The vein was then sewn to the artery in an end-to-side configuration with a running stitch of 6-0 Prolene.  Prior to completing this anastomosis Flushing maneuvers were performed and the artery was allowed to forward and back bleed.  There was no evidence of clot from any vessels.  I completed the anastomosis in the usual fashion and then released all vessel loops and clamps.    There was good  thrill in the venous outflow, and there was 1+ palpable radial pulse.  At this point, I irrigated out the surgical wound.  There was no further active bleeding.  The subcutaneous tissue was reapproximated with a running stitch of 3-0 Vicryl.  The skin was then reapproximated with a running subcuticular stitch of 4-0 Vicryl.  The skin was then cleaned, dried, and reinforced with Dermabond.    The patient tolerated this procedure well.   COMPLICATIONS: None  CONDITION: Tracy Jennings [facility]  Office: [phone]   05/11/2018, 12:04 PM

## 2018-05-11 NOTE — Op Note (Signed)
OPERATIVE NOTE   PROCEDURE: Thrombectomy left brachial cephalic arteriovenous fistula placement  PRE-OPERATIVE DIAGNOSIS: Complication of AV access with thrombosis postop; End Stage Renal Disease  POST-OPERATIVE DIAGNOSIS: Same  SURGEON: Hortencia Pilar  ASSISTANT(S): Ms. Hezzie Bump  ANESTHESIA: general  ESTIMATED BLOOD LOSS: <50 cc  FINDING(S): The fistula was occluded.  On opening the fistula a white granular material is found.  No maroon-colored clot is identified  SPECIMEN(S):  none  INDICATIONS:   Tracy Jennings is a 62 y.o. female who presents with end stage renal disease.  Earlier today she underwent creation of brachiocephalic fistula.  Postoperatively there was a palpable thrill noted.  However after approximately 2 hours in the recovery room she was noted to have lost her thrill and bruit.  She is therefore being brought back for intervention and salvage of her fistula.  The patient's husband is aware of the risks of the procedure and elects to proceed forward.  DESCRIPTION: After full informed written consent was obtained from the patient, the patient was brought back to the operating room and placed supine upon the operating table.  Prior to induction, the patient received IV antibiotics.   After obtaining adequate anesthesia, the patient was then prepped and draped in the standard fashion for a left arm access procedure.   The curvilinear incision was then reopened and the fistula exposed.  There was no thrill noted.  Vesseloops were then passed around the brachial artery proximally and distally to the anastomosis.  And a #2 Fogarty was delivered onto the field.  A transverse incision was created several millimeters above the anastomosis within the vein.  Some bleeding was noted.  A #2 Fogarty was then advanced through the cephalic vein to a distance of approximately 40 cm.  On pullback a small amount of white granular material was encountered.  Backbleeding  was then brisk.  The Fogarty was then advanced into the brachial artery proximally and withdrawn.  2 passes were made.  This time a much larger amount of white granular material was encountered and passed off the field as specimen.  2 passes were then made distally into the brachial artery.  At this point we had return of forward bleeding as well as brisk backbleeding.   Given the appearance of this material it did not look like typical thrombus it was much more granular in consistency and lighter in color which raised concerns regarding heparin-induced thrombocytopenia or heparin allergy.  Therefore the heparinized saline was removed from the field and the syringe and marks tip passed off the field.  A new marks tip was delivered onto the field and sterile saline was then poured.  The vein and the brachial artery was then irrigated copiously with plain sterile saline both proximally and distally.  The venotomy was then repaired with 5 interrupted 6-0 Prolene sutures.  Flushing maneuvers were performed flow was then reestablished through the fistula.  There was good  thrill in the venous outflow, and there was 1+ palpable radial pulse.  At this point, I irrigated out the surgical wound.  There was no further active bleeding.  The subcutaneous tissue was reapproximated with a running stitch of 3-0 Vicryl.  The skin was then reapproximated with a interrupted nylon sutures.  The skin was then cleaned, dried.    The patient tolerated this procedure well.   COMPLICATIONS: None  CONDITION: [name] [facility]  Office: [phone]   05/11/2018, 5:12 PM

## 2018-05-11 NOTE — Progress Notes (Signed)
Dr schnier in to check pt   Pt to go back to surgery   Also pt spit up small amt of yellow

## 2018-05-11 NOTE — Anesthesia Postprocedure Evaluation (Signed)
Anesthesia Post Note  Patient: Tracy Jennings  Procedure(s) Performed: ARTERIOVENOUS (AV) FISTULA CREATION (Left )  Patient location during evaluation: PACU Anesthesia Type: General Level of consciousness: awake and alert and oriented Pain management: pain level controlled Vital Signs Assessment: post-procedure vital signs reviewed and stable Respiratory status: spontaneous breathing Cardiovascular status: blood pressure returned to baseline Anesthetic complications: no     Last Vitals:  Vitals:   05/11/18 1541 05/11/18 1551  BP:  (!) 177/82  Pulse: 79 79  Resp: 11 14  Temp: 37.2 C   SpO2: 99% 97%    Last Pain:  Vitals:   05/11/18 1450  TempSrc:   PainSc: Asleep                 Danialle Dement

## 2018-05-11 NOTE — Transfer of Care (Signed)
Immediate Anesthesia Transfer of Care Note  Patient: Tracy Jennings  Procedure(s) Performed: THROMBECTOMY ARTERIOVENOUS FISTULA  Patient Location: PACU  Anesthesia Type:General  Level of Consciousness: sedated  Airway & Oxygen Therapy: Patient placed on Ventilator (see vital sign flow sheet for setting)  Post-op Assessment: Report given to RN and Post -op Vital signs reviewed and stable  Post vital signs: Reviewed and stable  Last Vitals:  Vitals Value Taken Time  BP 112/78 05/11/2018  6:04 PM  Temp    Pulse 75 05/11/2018  6:04 PM  Resp 0 05/11/2018  6:04 PM  SpO2 100 % 05/11/2018  6:04 PM  Vitals shown include unvalidated device data.  Last Pain:  Vitals:   05/11/18 1450  TempSrc:   PainSc: Asleep         Complications: Patient re-intubated, respiratory complications and cardiovascular complications

## 2018-05-11 NOTE — Anesthesia Preprocedure Evaluation (Addendum)
Anesthesia Evaluation  Patient identified by MRN, date of birth, ID band Patient awake    Reviewed: Allergy & Precautions, NPO status , Patient's Chart, lab work & pertinent test results  History of Anesthesia Complications Negative for: history of anesthetic complications  Airway Mallampati: II       Dental   Pulmonary neg pulmonary ROS, neg sleep apnea, neg COPD,    Pulmonary exam normal        Cardiovascular hypertension, Pt. on medications + Peripheral Vascular Disease  (-) Past MI and (-) CHF Normal cardiovascular exam(-) dysrhythmias + Valvular Problems/Murmurs      Neuro/Psych neg Seizures negative neurological ROS  negative psych ROS   GI/Hepatic Neg liver ROS, neg GERD  ,  Endo/Other  diabetes, Type 2, Insulin Dependent  Renal/GU Renal InsufficiencyRenal disease     Musculoskeletal negative musculoskeletal ROS (+)   Abdominal Normal abdominal exam  (+)   Peds  Hematology  (+) anemia ,   Anesthesia Other Findings Past Medical History: 04/2018: Anemia     Comment:  low iron. 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 No date: Diabetes mellitus without complication (HCC)     Comment:  type II No date: ESRD (end stage renal disease) (HCC) No date: Heart murmur     Comment:  followed as a child only No date: Hyperlipidemia associated with type 2 diabetes mellitus ([place]) No date: Hypertension No date: Pancreatitis No date: Peripheral vascular disease (HCC)  Reproductive/Obstetrics                             Anesthesia Physical  Anesthesia Plan  ASA: III  Anesthesia Plan: General   Post-op Pain Management:    Induction: Intravenous  PONV Risk Score and Plan:   Airway Management Planned: Oral ETT  Additional Equipment:   Intra-op Plan:   Post-operative Plan: Extubation in OR  Informed Consent: I have reviewed the  patients History and Physical, chart, labs and discussed the procedure including the risks, benefits and alternatives for the proposed anesthesia with the patient or authorized representative who has indicated his/her understanding and acceptance.     Plan Discussed with:   Anesthesia Plan Comments:        Anesthesia Quick Evaluation

## 2018-05-11 NOTE — Progress Notes (Signed)
Aroused briefly   Asked pt if nausea better   She nodded yes   Sleeping comfortable

## 2018-05-11 NOTE — Progress Notes (Signed)
Patient transferred to CT without incident. Et tube placement verified with NP Arvil Chaco once returned to unit per radiology report and xray.  Placement noted 22cm at lip. bbs noted equal and clear. States leave in place for now. Vent monitored. Vitals stable.

## 2018-05-12 ENCOUNTER — Encounter: Payer: Self-pay | Admitting: Vascular Surgery

## 2018-05-12 ENCOUNTER — Inpatient Hospital Stay: Payer: Managed Care, Other (non HMO)

## 2018-05-12 ENCOUNTER — Inpatient Hospital Stay
Admission: RE | Admit: 2018-05-12 | Discharge: 2018-05-12 | Disposition: A | Discharge status: Home / Self Care | Payer: Managed Care, Other (non HMO) | Source: Ambulatory Visit | Attending: Internal Medicine | Admitting: Internal Medicine

## 2018-05-12 DIAGNOSIS — I469 Cardiac arrest, cause unspecified: Secondary | ICD-10-CM

## 2018-05-12 LAB — PROTIME-INR
INR: 1.04
Prothrombin Time: 13.5 seconds (ref 11.4–15.2)

## 2018-05-12 LAB — BASIC METABOLIC PANEL
Anion gap: 10 (ref 5–15)
Anion gap: 13 (ref 5–15)
Anion gap: 12 (ref 5–15)
Anion gap: 10 (ref 5–15)
Anion gap: 10 (ref 5–15)
Anion gap: 11 (ref 5–15)
BUN: 61 mg/dL — ABNORMAL HIGH (ref 6–20)
BUN: 60 mg/dL — ABNORMAL HIGH (ref 6–20)
BUN: 63 mg/dL — ABNORMAL HIGH (ref 6–20)
BUN: 58 mg/dL — ABNORMAL HIGH (ref 6–20)
BUN: 62 mg/dL — ABNORMAL HIGH (ref 6–20)
BUN: 64 mg/dL — ABNORMAL HIGH (ref 6–20)
CO2: 22 mmol/L (ref 22–32)
CO2: 20 mmol/L — ABNORMAL LOW (ref 22–32)
CO2: 19 mmol/L — ABNORMAL LOW (ref 22–32)
CO2: 24 mmol/L (ref 22–32)
CO2: 22 mmol/L (ref 22–32)
CO2: 20 mmol/L — ABNORMAL LOW (ref 22–32)
Calcium: 7.9 mg/dL — ABNORMAL LOW (ref 8.9–10.3)
Calcium: 7.8 mg/dL — ABNORMAL LOW (ref 8.9–10.3)
Calcium: 8 mg/dL — ABNORMAL LOW (ref 8.9–10.3)
Calcium: 7.9 mg/dL — ABNORMAL LOW (ref 8.9–10.3)
Calcium: 7.8 mg/dL — ABNORMAL LOW (ref 8.9–10.3)
Calcium: 7.7 mg/dL — ABNORMAL LOW (ref 8.9–10.3)
Chloride: 109 mmol/L (ref 98–111)
Chloride: 110 mmol/L (ref 98–111)
Chloride: 112 mmol/L — ABNORMAL HIGH (ref 98–111)
Chloride: 112 mmol/L — ABNORMAL HIGH (ref 98–111)
Chloride: 113 mmol/L — ABNORMAL HIGH (ref 98–111)
Chloride: 113 mmol/L — ABNORMAL HIGH (ref 98–111)
Creatinine, Ser: 5.76 mg/dL — ABNORMAL HIGH (ref 0.44–1.00)
Creatinine, Ser: 5.3 mg/dL — ABNORMAL HIGH (ref 0.44–1.00)
Creatinine, Ser: 5.55 mg/dL — ABNORMAL HIGH (ref 0.44–1.00)
Creatinine, Ser: 5.57 mg/dL — ABNORMAL HIGH (ref 0.44–1.00)
Creatinine, Ser: 5.7 mg/dL — ABNORMAL HIGH (ref 0.44–1.00)
Creatinine, Ser: 5.69 mg/dL — ABNORMAL HIGH (ref 0.44–1.00)
GFR calc Af Amer: 8 mL/min — ABNORMAL LOW (ref >60)
GFR calc Af Amer: 9 mL/min — ABNORMAL LOW (ref >60)
GFR calc Af Amer: 9 mL/min — ABNORMAL LOW (ref >60)
GFR calc Af Amer: 9 mL/min — ABNORMAL LOW (ref >60)
GFR calc Af Amer: 8 mL/min — ABNORMAL LOW (ref >60)
GFR calc Af Amer: 8 mL/min — ABNORMAL LOW (ref >60)
GFR calc non Af Amer: 7 mL/min — ABNORMAL LOW (ref >60)
GFR calc non Af Amer: 8 mL/min — ABNORMAL LOW (ref >60)
GFR calc non Af Amer: 8 mL/min — ABNORMAL LOW (ref >60)
GFR calc non Af Amer: 8 mL/min — ABNORMAL LOW (ref >60)
GFR calc non Af Amer: 7 mL/min — ABNORMAL LOW (ref >60)
GFR calc non Af Amer: 7 mL/min — ABNORMAL LOW (ref >60)
Glucose, Bld: 235 mg/dL — ABNORMAL HIGH (ref 70–99)
Glucose, Bld: 213 mg/dL — ABNORMAL HIGH (ref 70–99)
Glucose, Bld: 185 mg/dL — ABNORMAL HIGH (ref 70–99)
Glucose, Bld: 153 mg/dL — ABNORMAL HIGH (ref 70–99)
Glucose, Bld: 119 mg/dL — ABNORMAL HIGH (ref 70–99)
Glucose, Bld: 116 mg/dL — ABNORMAL HIGH (ref 70–99)
Potassium: 4.1 mmol/L (ref 3.5–5.1)
Potassium: 3.5 mmol/L (ref 3.5–5.1)
Potassium: 3.3 mmol/L — ABNORMAL LOW (ref 3.5–5.1)
Potassium: 3.4 mmol/L — ABNORMAL LOW (ref 3.5–5.1)
Potassium: 3.3 mmol/L — ABNORMAL LOW (ref 3.5–5.1)
Potassium: 3.5 mmol/L (ref 3.5–5.1)
Sodium: 141 mmol/L (ref 135–145)
Sodium: 143 mmol/L (ref 135–145)
Sodium: 143 mmol/L (ref 135–145)
Sodium: 146 mmol/L — ABNORMAL HIGH (ref 135–145)
Sodium: 145 mmol/L (ref 135–145)
Sodium: 144 mmol/L (ref 135–145)

## 2018-05-12 LAB — GLUCOSE, CAPILLARY
Glucose-Capillary: 103 mg/dL — ABNORMAL HIGH (ref 70–99)
Glucose-Capillary: 107 mg/dL — ABNORMAL HIGH (ref 70–99)
Glucose-Capillary: 111 mg/dL — ABNORMAL HIGH (ref 70–99)
Glucose-Capillary: 112 mg/dL — ABNORMAL HIGH (ref 70–99)
Glucose-Capillary: 113 mg/dL — ABNORMAL HIGH (ref 70–99)
Glucose-Capillary: 115 mg/dL — ABNORMAL HIGH (ref 70–99)
Glucose-Capillary: 128 mg/dL — ABNORMAL HIGH (ref 70–99)
Glucose-Capillary: 132 mg/dL — ABNORMAL HIGH (ref 70–99)
Glucose-Capillary: 134 mg/dL — ABNORMAL HIGH (ref 70–99)
Glucose-Capillary: 135 mg/dL — ABNORMAL HIGH (ref 70–99)
Glucose-Capillary: 138 mg/dL — ABNORMAL HIGH (ref 70–99)
Glucose-Capillary: 146 mg/dL — ABNORMAL HIGH (ref 70–99)
Glucose-Capillary: 148 mg/dL — ABNORMAL HIGH (ref 70–99)
Glucose-Capillary: 151 mg/dL — ABNORMAL HIGH (ref 70–99)
Glucose-Capillary: 154 mg/dL — ABNORMAL HIGH (ref 70–99)
Glucose-Capillary: 159 mg/dL — ABNORMAL HIGH (ref 70–99)
Glucose-Capillary: 172 mg/dL — ABNORMAL HIGH (ref 70–99)
Glucose-Capillary: 179 mg/dL — ABNORMAL HIGH (ref 70–99)
Glucose-Capillary: 182 mg/dL — ABNORMAL HIGH (ref 70–99)
Glucose-Capillary: 201 mg/dL — ABNORMAL HIGH (ref 70–99)
Glucose-Capillary: 211 mg/dL — ABNORMAL HIGH (ref 70–99)
Glucose-Capillary: 228 mg/dL — ABNORMAL HIGH (ref 70–99)
Glucose-Capillary: 237 mg/dL — ABNORMAL HIGH (ref 70–99)
Glucose-Capillary: 87 mg/dL (ref 70–99)

## 2018-05-12 LAB — CBC
HCT: 26.4 % — ABNORMAL LOW (ref 35.0–47.0)
Hemoglobin: 8.7 g/dL — ABNORMAL LOW (ref 12.0–16.0)
MCH: 29.5 pg (ref 26.0–34.0)
MCHC: 33 g/dL (ref 32.0–36.0)
MCV: 89.7 fL (ref 80.0–100.0)
Platelets: 143 10*3/uL — ABNORMAL LOW (ref 150–440)
RBC: 2.95 MIL/uL — ABNORMAL LOW (ref 3.80–5.20)
RDW: 13.9 % (ref 11.5–14.5)
WBC: 13.1 10*3/uL — ABNORMAL HIGH (ref 3.6–11.0)

## 2018-05-12 LAB — BLOOD GAS, ARTERIAL
Acid-base deficit: 3.5 mmol/L — ABNORMAL HIGH (ref 0.0–2.0)
Bicarbonate: 23.9 mmol/L (ref 20.0–28.0)
FIO2: 0.5
MECHVT: 500 mL
Mechanical Rate: 15
O2 Saturation: 99.7 %
PEEP: 5 cmH2O
Patient temperature: 37
pCO2 arterial: 52 mmHg — ABNORMAL HIGH (ref 32.0–48.0)
pH, Arterial: 7.27 — ABNORMAL LOW (ref 7.350–7.450)
pO2, Arterial: 227 mmHg — ABNORMAL HIGH (ref 83.0–108.0)

## 2018-05-12 LAB — BLOOD GAS, VENOUS
Acid-Base Excess: 1.1 mmol/L (ref 0.0–2.0)
Acid-Base Excess: 1.6 mmol/L (ref 0.0–2.0)
Bicarbonate: 22.5 mmol/L (ref 20.0–28.0)
Bicarbonate: 22.4 mmol/L (ref 20.0–28.0)
FIO2: 30
FIO2: 0.3
MECHVT: 500 mL
Mechanical Rate: 20
O2 Saturation: 87.4 %
O2 Saturation: 88.1 %
PEEP: 5 cmH2O
PEEP: 5 cmH2O
Patient temperature: 37
Patient temperature: 37
RATE: 20 resp/min
pCO2, Ven: 24 mmHg — ABNORMAL LOW (ref 44.0–60.0)
pCO2, Ven: 25 mmHg — ABNORMAL LOW (ref 44.0–60.0)
pH, Ven: 7.58 — ABNORMAL HIGH (ref 7.250–7.430)
pH, Ven: 7.56 — ABNORMAL HIGH (ref 7.250–7.430)
pO2, Ven: 44 mmHg (ref 32.0–45.0)
pO2, Ven: 46 mmHg — ABNORMAL HIGH (ref 32.0–45.0)

## 2018-05-12 LAB — ECHOCARDIOGRAM COMPLETE
Height: 63.5 in
Weight: 2927.71 oz

## 2018-05-12 LAB — TROPONIN I
Troponin I: 0.08 ng/mL (ref <0.03)
Troponin I: 0.07 ng/mL (ref <0.03)
Troponin I: 0.06 ng/mL (ref <0.03)

## 2018-05-12 LAB — LACTIC ACID, PLASMA
Lactic Acid, Venous: 1.1 mmol/L (ref 0.5–1.9)
Lactic Acid, Venous: 1.2 mmol/L (ref 0.5–1.9)

## 2018-05-12 LAB — APTT: aPTT: 29 seconds (ref 24–36)

## 2018-05-12 LAB — MAGNESIUM: Magnesium: 1.9 mg/dL (ref 1.7–2.4)

## 2018-05-12 MED ORDER — SODIUM CHLORIDE 0.9% FLUSH
10.0000 mL | Freq: Two times a day (BID) | INTRAVENOUS | Status: DC
  Administered 2018-05-12 – 2018-05-16 (×7): 10 mL
  Administered 2018-05-16: 30 mL
  Administered 2018-05-17: 20 mL
  Administered 2018-05-17 – 2018-05-19 (×4): 10 mL
  Administered 2018-05-20: 30 mL
  Administered 2018-05-20 – 2018-05-22 (×5): 10 mL

## 2018-05-12 MED ORDER — SODIUM CHLORIDE 0.9% FLUSH: 10.0000 mL | INTRAVENOUS | Status: DC | PRN

## 2018-05-12 MED ORDER — STERILE WATER FOR INJECTION IJ SOLN
INTRAMUSCULAR | Status: AC
  Administered 2018-05-12: 1 mL
  Filled 2018-05-12: qty 10

## 2018-05-12 MED ORDER — ASPIRIN 81 MG PO CHEW
81.0000 mg | CHEWABLE_TABLET | Freq: Every day | ORAL | Status: DC
  Administered 2018-05-12 – 2018-05-25 (×12): 81 mg
  Filled 2018-05-12 (×13): qty 1

## 2018-05-12 MED ORDER — POTASSIUM CHLORIDE 10 MEQ/100ML IV SOLN
10.0000 meq | INTRAVENOUS | Status: AC
  Administered 2018-05-12 (×2): 10 meq via INTRAVENOUS
  Filled 2018-05-12 (×2): qty 100

## 2018-05-12 MED ORDER — SODIUM BICARBONATE 8.4 % IV SOLN
50.0000 meq | Freq: Once | INTRAVENOUS | Status: AC
  Administered 2018-05-12: 50 meq via INTRAVENOUS
  Filled 2018-05-12: qty 50

## 2018-05-12 MED ORDER — ORAL CARE MOUTH RINSE
15.0000 mL | OROMUCOSAL | Status: DC
  Administered 2018-05-12 – 2018-05-15 (×32): 15 mL via OROMUCOSAL

## 2018-05-12 MED ORDER — POTASSIUM CHLORIDE 10 MEQ/100ML IV SOLN
10.0000 meq | INTRAVENOUS | Status: AC
  Administered 2018-05-12 (×2): 10 meq via INTRAVENOUS
  Filled 2018-05-12 (×4): qty 100

## 2018-05-12 MED ORDER — ALTEPLASE 2 MG IJ SOLR
2.0000 mg | Freq: Once | INTRAMUSCULAR | Status: AC
  Administered 2018-05-12: 2 mg
  Filled 2018-05-12: qty 2

## 2018-05-12 MED ORDER — FAMOTIDINE IN NACL 20-0.9 MG/50ML-% IV SOLN
20.0000 mg | INTRAVENOUS | Status: DC
  Administered 2018-05-12 – 2018-05-14 (×3): 20 mg via INTRAVENOUS
  Filled 2018-05-12 (×3): qty 50

## 2018-05-12 MED ORDER — CHLORHEXIDINE GLUCONATE 0.12% ORAL RINSE (MEDLINE KIT)
15.0000 mL | Freq: Two times a day (BID) | OROMUCOSAL | Status: DC
  Administered 2018-05-12 – 2018-05-15 (×7): 15 mL via OROMUCOSAL

## 2018-05-12 MED ORDER — DEXTROSE 10 % IV SOLN
INTRAVENOUS | Status: DC
  Administered 2018-05-12: 19:00:00 via INTRAVENOUS

## 2018-05-12 NOTE — Consult Note (Signed)
PHARMACY CONSULT NOTE - INITIAL   Pharmacy Consult for Electrolyte Monitoring and Replacement   Labs: Recent Labs    05/11/18 1922 05/11/18 2105  05/12/18 0225 05/12/18 0356 05/12/18 0624 05/12/18 1005  WBC 12.3*  --   --  13.1*  --   --   --   HGB 9.0*  --   --  8.7*  --   --   --   HCT 27.5*  --   --  26.4*  --   --   --   PLT 207  --   --  143*  --   --   --   APTT  --  27  --   --  29  --   --   CREATININE 5.73*  --    < > 5.76*  --  5.30* 5.55*  MG 1.7  --   --  1.9  --   --   --   PHOS 5.9*  --   --   --   --   --   --   ALBUMIN 2.8*  --   --   --   --   --   --   PROT 5.8*  --   --   --   --   --   --   AST 39  --   --   --   --   --   --   ALT 32  --   --   --   --   --   --   ALKPHOS 42  --   --   --   --   --   --   BILITOT 0.5  --   --   --   --   --   --    < > = values in this interval not displayed.   Potassium (mmol/L)  Date Value  05/12/2018 3.3 (L)  06/14/2014 3.6   Magnesium (mg/dL)  Date Value  05/12/2018 1.9  12/10/2013 1.8   Calcium (mg/dL)  Date Value  05/12/2018 8.0 (L)   Calcium, Total (mg/dL)  Date Value  06/14/2014 8.4 (L)   Albumin (g/dL)  Date Value  05/11/2018 2.8 (L)  06/12/2014 2.9 (L)   Phosphorus (mg/dL)  Date Value  05/11/2018 5.9 (H)  ] Correct Calcium: 8.86  Estimated Creatinine Clearance: 11.1 mL/min (A) (by C-G formula based on SCr of 5.55 mg/dL (H)).   Medical History: Past Medical History:  Diagnosis Date  . Anemia 04/2018   low iron. to be started on supplements  . CKD (chronic kidney disease)    Stage IV  . Diabetes mellitus without complication ([place])    type II  . ESRD (end stage renal disease) ([place])   . Heart murmur    followed as a child only  . Hyperlipidemia associated with type 2 diabetes mellitus ([place])   . Hypertension   . Pancreatitis   . Peripheral vascular disease [facility])     Assessment: Pharmacy consulted for electrolyte monitoring and replacement in 62 yo female with PMH of ESRD.    Goal of Therapy:  Electrolytes WNL  Plan:  8/31 10:00 K 3.3 - will give potassium chloride 10 mEq IV Q1H x 2 doses and continue to follow electrolytes as ordered.   Laural Benes, PharmD, BCPS Clinical Pharmacist 05/12/2018 1:39 PM

## 2018-05-12 NOTE — Consult Note (Signed)
Cardiology Consultation Note    Patient ID: Tracy Jennings, MRN: [id number], DOB/AGE: [birth year] 62 y.o. Admit date: 05/11/2018   Date of Consult: 05/12/2018 Primary Physician: [facility] Primary Cardiologist:    Chief Complaint: respiratory arrest Reason for Consultation: arrest Requesting MD: Dr. Jerelyn Charles  HPI: Tracy Jennings is a 62 y.o. female with history of end-stage renal disease on hemodialysis with no apparent cardiac problems.  She presented for revision of her AV fistula.  Immediate postop course was complicated by abrupt closure.  There went revision of this.  Post surgical course was complicated by noted apnea.  She was then noted to be bradycardic.  CODE BLUE CPR was initiated.  Patient was intubated and transferred to the ICU.  No apparent loss of pulse.  She is currently intubated and hemodynamically stable.  She had a trivial troponin elevation at 0.08.  Electrocardiogram showed sinus rhythm with no ischemia.  Echo done today revealed preserved LV function with normal LV function with no regional wall motion Atteberry's normal right-sided pressures.  No significant valvular abnormalities noted.  Patient is currently sedated and intubated unable to give history.  Electrolytes were stable including potassium 3.5.  Opponent 0.08.  Past Medical History:  Diagnosis Date  . Anemia 04/2018   low iron. to be started on supplements  . CKD (chronic kidney disease)    Stage IV  . Diabetes mellitus without complication ([place])    type II  . ESRD (end stage renal disease) ([place])   . Heart murmur    followed as a child only  . Hyperlipidemia associated with type 2 diabetes mellitus ([place])   . Hypertension   . Pancreatitis   . Peripheral vascular disease [facility])       Surgical History:  Past Surgical History:  Procedure Laterality Date  . AMPUTATION TOE Left 2013   2nd toe. tip of toe (toe nail was infected)  . AV FISTULA PLACEMENT Left 05/11/2018   Procedure:  ARTERIOVENOUS (AV) FISTULA CREATION;  Surgeon: Katha Cabal, MD;  Location: [facility] ORS;  Service: Vascular;  Laterality: Left;  . CATARACT EXTRACTION    . CHOLECYSTECTOMY  2014  . COLONOSCOPY    . COLONOSCOPY WITH PROPOFOL N/A 01/10/2018   Procedure: COLONOSCOPY WITH PROPOFOL;  Surgeon: Toledo, Benay Pike, MD;  Location: [facility] ENDOSCOPY;  Service: Gastroenterology;  Laterality: N/A;  . EYE SURGERY Bilateral 2018   cataract extractions  . TUBAL LIGATION  1984     Home Meds: Prior to Admission medications   Medication Sig Start Date End Date Taking? Authorizing Provider  atorvastatin (LIPITOR) 40 MG tablet Take 40 mg by mouth daily at 6 PM.  10/05/15 05/11/18 Yes [provider]  bumetanide (BUMEX) 1 MG tablet Take 2 mg by mouth 2 (two) times daily.  02/28/18 02/28/19 Yes [provider]  carvedilol (COREG CR) 40 MG 24 hr capsule Take 40 mg by mouth daily. Takes in the morning when she gets home from work 11/06/17  Yes [provider]  diltiazem (CARDIZEM LA) 120 MG 24 hr tablet Take 1 tablet (120 mg total) by mouth daily. Patient taking differently: Take 120 mg by mouth daily. Takes in the morning when she gets home from work 08/08/17  Yes Cuthriell, Charline Bills, PA-C  fluticasone (FLONASE) 50 MCG/ACT nasal spray 2 sprays by Each Nare route daily. 01/15/18 01/15/19 Yes [provider]  hydrALAZINE (APRESOLINE) 50 MG tablet Take 50 mg by mouth every 8 (eight) hours.  10/24/17  Yes [provider]  Insulin Glargine 300 UNIT/ML SOPN Inject 40 Units into the skin daily. In the morning.  (toujeo) 11/30/15  Yes [provider]  amLODipine (NORVASC) 5 MG tablet Take 1 tablet by mouth once daily 01/18/16   [provider]  aspirin EC 81 MG tablet Take 81 mg by mouth daily. 09/27/17   [provider]  HYDROcodone-acetaminophen (NORCO) 5-325 MG tablet Take 1-2 tablets by mouth every 6 (six) hours as needed for moderate pain or severe pain. 05/11/18    Schnier, Dolores Lory, MD  insulin aspart (NOVOLOG) 100 UNIT/ML FlexPen Inject 10-12 Units into the skin 3 (three) times daily with meals.  12/28/15   [provider]  losartan (COZAAR) 100 MG tablet Take 1 tablet by mouth once daily 01/18/16   [provider]  metoprolol (LOPRESSOR) 50 MG tablet Take 50 mg by mouth daily.  02/20/15   [provider]  ondansetron (ZOFRAN) 4 MG tablet Take 1 tablet (4 mg total) by mouth every 6 (six) hours as needed for nausea. Patient not taking: Reported on 05/04/2018 11/03/17   Epifanio Lesches, MD  oxyCODONE (OXY IR/ROXICODONE) 5 MG immediate release tablet Take 1 tablet (5 mg total) by mouth every 4 (four) hours as needed for moderate pain. Patient not taking: Reported on 05/04/2018 11/03/17   Epifanio Lesches, MD  spironolactone (ALDACTONE) 50 MG tablet Take 50 mg by mouth daily. 09/27/17   [provider]  Vitamin D, Ergocalciferol, (DRISDOL) 50000 units CAPS capsule TAKE ONE CAPSULE BY MOUTH ONE TIME PER WEEK 01/06/18   [provider]    Inpatient Medications:  . artificial tears  1 application Both Eyes [id number]  . aspirin EC  81 mg Oral Daily  . aspirin  300 mg Rectal STAT  . atorvastatin  40 mg Oral q1800  . bumetanide  2 mg Oral BID  . carvedilol  40 mg Oral Daily  . chlorhexidine gluconate (MEDLINE KIT)  15 mL Mouth Rinse BID  . clopidogrel  300 mg Oral STAT  . clopidogrel  75 mg Oral Daily  . diltiazem  120 mg Oral Daily  . docusate sodium  100 mg Oral BID  . hydrALAZINE  50 mg Oral Q8H  . insulin glargine  40 Units Subcutaneous Daily  . mouth rinse  15 mL Mouth Rinse 10 times per day  . spironolactone  50 mg Oral Daily   . sodium chloride 10 mL/hr at 05/12/18 0800  .  ceFAZolin (ANCEF) IV Stopped (05/12/18 0235)  . cisatracurium (NIMBEX) infusion 1 mcg/kg/min (05/12/18 0800)  . famotidine (PEPCID) IV    . fentaNYL infusion INTRAVENOUS 250 mcg/hr (05/12/18 0800)  . norepinephrine (LEVOPHED) Adult  infusion Stopped (05/12/18 0231)  . propofol (DIPRIVAN) infusion 75 mcg/kg/min (05/12/18 0800)    Allergies:  Allergies  Allergen Reactions  . Hctz [Hydrochlorothiazide]     pancreatitis  . Amlodipine Swelling    Knees down to ankles  . Amlodipine Besylate Swelling  . Sulfa Antibiotics Rash  . Dairy Aid [Lactase]     Runny nose    Social History   Socioeconomic History  . Marital status: Married    Spouse name: clarence  . Number of children: Not on file  . Years of education: Not on file  . Highest education level: Not on file  Occupational History    Comment: works nights  Social Needs  . Financial resource strain: Not on file  . Food insecurity:    Worry: Not on  file    Inability: Not on file  . Transportation needs:    Medical: Not on file    Non-medical: Not on file  Tobacco Use  . Smoking status: Never Smoker  . Smokeless tobacco: Never Used  Substance and Sexual Activity  . Alcohol use: No  . Drug use: Never  . Sexual activity: Not Currently  Lifestyle  . Physical activity:    Days per week: Not on file    Minutes per session: Not on file  . Stress: Not on file  Relationships  . Social connections:    Talks on phone: Not on file    Gets together: Not on file    Attends religious service: Not on file    Active member of club or organization: Not on file    Attends meetings of clubs or organizations: Not on file    Relationship status: Not on file  . Intimate partner violence:    Fear of current or ex partner: Not on file    Emotionally abused: Not on file    Physically abused: Not on file    Forced sexual activity: Not on file  Other Topics Concern  . Not on file  Social History Narrative  . Not on file     Family History  Problem Relation Age of Onset  . Stroke Mother   . Hypertension Mother   . Cancer Father   . Diabetes Sister   . Diabetes Maternal Grandmother   . Diabetes Maternal Grandfather   . Diabetes Paternal Grandmother   .  Diabetes Paternal Grandfather      Review of Systems: A 12-system review of systems was performed and is negative except as noted in the HPI.  Labs: Recent Labs    05/11/18 1922 05/11/18 2105 05/12/18 0225  TROPONINI 0.03* 0.05* 0.08*   Lab Results  Component Value Date   WBC 13.1 (H) 05/12/2018   HGB 8.7 (L) 05/12/2018   HCT 26.4 (L) 05/12/2018   MCV 89.7 05/12/2018   PLT 143 (L) 05/12/2018    Recent Labs  Lab 05/11/18 1922  05/12/18 0624  NA 139   < > 143  K 5.3*   < > 3.5  CL 111   < > 110  CO2 21*   < > 20*  BUN 59*   < > 60*  CREATININE 5.73*   < > 5.30*  CALCIUM 8.0*   < > 7.8*  PROT 5.8*  --   --   BILITOT 0.5  --   --   ALKPHOS 42  --   --   ALT 32  --   --   AST 39  --   --   GLUCOSE 192*   < > 213*   < > = values in this interval not displayed.   Lab Results  Component Value Date   CHOL 122 12/11/2013   HDL 43 12/11/2013   LDLCALC 58 12/11/2013   TRIG 104 12/11/2013   No results found for: DDIMER  Radiology/Studies:  Dg Abd 1 View  Result Date: 05/11/2018 CLINICAL DATA:  Orogastric tube placement. EXAM: ABDOMEN - 1 VIEW COMPARISON:  CT abdomen and pelvis August 07, 2016 FINDINGS: Nasogastric tube tip projects in mid stomach, side port past GE junction region. Included bowel gas pattern is nondilated and nonobstructive. Surgical clips in the included right abdomen compatible with cholecystectomy. Included soft tissue planes and osseous structures are non suspicious. IMPRESSION: Nasogastric tube tip projects mid stomach. Electronically Signed  By: Elon Alas M.D.   On: 05/11/2018 22:37   Ct Head Wo Contrast  Result Date: 05/11/2018 CLINICAL DATA:  Cardiac arrest. EXAM: CT HEAD WITHOUT CONTRAST TECHNIQUE: Contiguous axial images were obtained from the base of the skull through the vertex without intravenous contrast. COMPARISON:  None. FINDINGS: Brain: No evidence of acute infarction, hemorrhage, hydrocephalus, extra-axial collection or mass  lesion/mass effect. Mild generalized cerebral atrophy. Vascular: No hyperdense vessel or unexpected calcification. Skull: Normal. Negative for fracture or focal lesion. Sinuses/Orbits: Scattered ethmoid air cell mucosal thickening with small air-fluid levels. Small air-fluid level in the right sphenoid sinus. Mastoid air cells are clear. Other: None. IMPRESSION: 1.  No acute intracranial abnormality. Electronically Signed   By: Titus Dubin M.D.   On: 05/11/2018 20:02   Dg Chest Port 1 View  Result Date: 05/11/2018 CLINICAL DATA:  Central line placement. EXAM: PORTABLE CHEST 1 VIEW COMPARISON:  Chest radiograph May 11, 2018 at 1808 hours FINDINGS: No central line identified. Endotracheal tube tip projects 4.6 cm above the carina. Nasogastric tube past proximal stomach, out of field of view. Stable cardiomegaly with interstitial prominence and patchy RIGHT upper lobe airspace opacities. No pleural effusion. No pneumothorax. Soft tissue planes and included osseous structures are unchanged. IMPRESSION: 1. No central line identified. Endotracheal tube tip projects 4.6 cm above the carina. Nasogastric tube past proximal stomach. 2. Stable cardiomegaly. Increasing interstitial and RIGHT alveolar airspace opacities, this could reflect pulmonary edema and/or pneumonia. Electronically Signed   By: Elon Alas M.D.   On: 05/11/2018 22:36   Dg Chest Port 1 View  Result Date: 05/11/2018 CLINICAL DATA:  Intubation. EXAM: PORTABLE CHEST 1 VIEW COMPARISON:  Chest x-ray dated October 15, 2017. FINDINGS: Interval placement of an endotracheal tube with the tip approximately 1 cm above the carina. Cardiomegaly. Bilateral perihilar hazy opacities. No focal consolidation, pleural effusion, or pneumothorax. Gaseous distention of the stomach. No acute osseous abnormality. IMPRESSION: 1. Endotracheal tube tip 1 cm above the carina. Consider retraction 2-3 cm. 2. Cardiomegaly with hazy perihilar opacities, suspicious for  mild pulmonary edema. Electronically Signed   By: Titus Dubin M.D.   On: 05/11/2018 18:26    Wt Readings from Last 3 Encounters:  05/12/18 83 kg  05/04/18 80.4 kg  04/12/18 81.6 kg    EKG: nsr with no ischemia.  Physical Exam: Intubated and sedated. Blood pressure (!) 126/58, pulse 63, temperature (!) 91.6 F (33.1 C), resp. rate 13, height 5' 3.5" (1.613 m), weight 83 kg, SpO2 100 %. Body mass index is 31.91 kg/m. General: Acutely ill intubated and sedated.  Hemodynamically stable. Head: Normocephalic, atraumatic, sclera non-icteric, no xanthomas, nares are without discharge.  Neck: Negative for carotid bruits. JVD not elevated. Lungs: Breathing with ventilator. Heart: RRR with S1 S2. No murmurs, rubs, or gallops appreciated. Abdomen: Soft, non-tender, non-distended with normoactive bowel sounds. No hepatomegaly. No rebound/guarding. No obvious abdominal masses. Msk:  Strength and tone appear normal for age. Extremities: No clubbing or cyanosis. No edema.  Distal pedal pulses are 2+ and equal bilaterally. Neuro: Intubated and sedated.     Assessment and Plan  62 year old female with end-stage renal disease currently status post what appeared to be a respiratory arrest.  Electric cardiogram shows no injury current.  Echo shows no regional wall motion abnormalities.  Troponin mildly elevated however patient was significantly hypoxic and did receive CPR.  Does not appear to have had an acute coronary event.  Would continue to follow neuro status as well as  wean vent as tolerated.  No further invasive invasive cardiac evaluation indicated at present.  Signed, Teodoro Spray MD 05/12/2018, 9:07 AM Pager: [phone]

## 2018-05-12 NOTE — Progress Notes (Signed)
Bite block placed due to RN witnessing pt biting ETT

## 2018-05-12 NOTE — Progress Notes (Signed)
Name: Tracy Jennings MRN: [id number] DOB: 1956/08/13     CONSULTATION DATE: 05/11/2018  Subjective & Objectives: S/p cardiac arrest on hypothermia.  PAST MEDICAL HISTORY :   has a past medical history of Anemia (04/2018), CKD (chronic kidney disease), Diabetes mellitus without complication ([place]), ESRD (end stage renal disease) ([place]), Heart murmur, Hyperlipidemia associated with type 2 diabetes mellitus ([place]), Hypertension, Pancreatitis, and Peripheral vascular disease ([place]).  has a past surgical history that includes Cataract extraction; Amputation toe (Left, 2013); Colonoscopy; Colonoscopy with propofol (N/A, 01/10/2018); Eye surgery (Bilateral, 2018); Cholecystectomy (2014); Tubal ligation (1984); AV fistula placement (Left, 05/11/2018); and Thrombectomy w/ embolectomy (05/11/2018). Prior to Admission medications   Medication Sig Start Date End Date Taking? Authorizing Provider  atorvastatin (LIPITOR) 40 MG tablet Take 40 mg by mouth daily at 6 PM.  10/05/15 05/11/18 Yes [provider]  bumetanide (BUMEX) 1 MG tablet Take 2 mg by mouth 2 (two) times daily.  02/28/18 02/28/19 Yes [provider]  carvedilol (COREG CR) 40 MG 24 hr capsule Take 40 mg by mouth daily. Takes in the morning when she gets home from work 11/06/17  Yes [provider]  diltiazem (CARDIZEM LA) 120 MG 24 hr tablet Take 1 tablet (120 mg total) by mouth daily. Patient taking differently: Take 120 mg by mouth daily. Takes in the morning when she gets home from work 08/08/17  Yes Cuthriell, Charline Bills, PA-C  fluticasone (FLONASE) 50 MCG/ACT nasal spray 2 sprays by Each Nare route daily. 01/15/18 01/15/19 Yes [provider]  hydrALAZINE (APRESOLINE) 50 MG tablet Take 50 mg by mouth every 8 (eight) hours.  10/24/17  Yes [provider]  Insulin Glargine 300 UNIT/ML SOPN Inject 40 Units into the skin daily. In the morning.  (toujeo) 11/30/15  Yes [provider]  amLODipine (NORVASC)  5 MG tablet Take 1 tablet by mouth once daily 01/18/16   [provider]  aspirin EC 81 MG tablet Take 81 mg by mouth daily. 09/27/17   [provider]  HYDROcodone-acetaminophen (NORCO) 5-325 MG tablet Take 1-2 tablets by mouth every 6 (six) hours as needed for moderate pain or severe pain. 05/11/18   Schnier, Dolores Lory, MD  insulin aspart (NOVOLOG) 100 UNIT/ML FlexPen Inject 10-12 Units into the skin 3 (three) times daily with meals.  12/28/15   [provider]  losartan (COZAAR) 100 MG tablet Take 1 tablet by mouth once daily 01/18/16   [provider]  metoprolol (LOPRESSOR) 50 MG tablet Take 50 mg by mouth daily.  02/20/15   [provider]  ondansetron (ZOFRAN) 4 MG tablet Take 1 tablet (4 mg total) by mouth every 6 (six) hours as needed for nausea. Patient not taking: Reported on 05/04/2018 11/03/17   Epifanio Lesches, MD  oxyCODONE (OXY IR/ROXICODONE) 5 MG immediate release tablet Take 1 tablet (5 mg total) by mouth every 4 (four) hours as needed for moderate pain. Patient not taking: Reported on 05/04/2018 11/03/17   Epifanio Lesches, MD  spironolactone (ALDACTONE) 50 MG tablet Take 50 mg by mouth daily. 09/27/17   [provider]  Vitamin D, Ergocalciferol, (DRISDOL) 50000 units CAPS capsule TAKE ONE CAPSULE BY MOUTH ONE TIME PER WEEK 01/06/18   [provider]   Allergies  Allergen Reactions  . Hctz [Hydrochlorothiazide]     pancreatitis  . Amlodipine Swelling    Knees down to ankles  . Amlodipine Besylate Swelling  . Sulfa Antibiotics Rash  . Dairy Aid [Lactase]  Runny nose    FAMILY HISTORY:  family history includes Cancer in her father; Diabetes in her maternal grandfather, maternal grandmother, paternal grandfather, paternal grandmother, and sister; Hypertension in her mother; Stroke in her mother. SOCIAL HISTORY:  reports that she has never smoked. She has never used smokeless tobacco. She reports that she does  not drink alcohol or use drugs.  REVIEW OF SYSTEMS:   Unable to obtain due to critical illness   VITAL SIGNS: Temp:  [90.3 F (32.4 C)-99 F (37.2 C)] 91.6 F (33.1 C) (08/31 0930) Pulse Rate:  [59-96] 61 (08/31 1030) Resp:  [0-24] 20 (08/31 1030) BP: (100-211)/(53-91) 143/64 (08/31 1030) SpO2:  [94 %-100 %] 100 % (08/31 1030) FiO2 (%):  [30 %-50 %] 30 % (08/31 0742) Weight:  [81.6 kg-83 kg] 83 kg (08/31 0500)  Physical Examination:  On sedatives and paralytics. Induced hypothermia protocol On vent, no distress, bilateral equal air entry and no adventitious sounds S1 & S2 are audible with no murmur Benign abdominal exam with feeble peristalsis Dress left arm newly created AV fistula, no thrill palpable No edema   ASSESSMENT / PLAN:  Acute respiratory failure. On hypothermia protocol. -Monitor ABG, optimize ventilator settings -Optimize ETT postion, monitor CXR.  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 On acute intracranial abnormality on CT head. On induced hypothermia. -Monitor neuro status -EEG and neurology evaluation after rewarming.  Cardiac arrest in OR after refashioning of left arm AV fistula created for dialysis. ECHO LVEF 18-29%, diastolic dysfunction grade I. Elevated Troponin, no ischemic changes on EKG. -Induced hypothermia and no further recommendation by cardiology. -Hemodynamic monitoring.  CHF with pulmonary congestion on chest x-ray post intubation -Diuresis/RRT as per renal to improve lung compliance.  HTN -Optimize antihypertensives and monitor hemodynamics.  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 S/p placement of Lt. Arm AVF  -Management as per renal. Not on RRT.  Left arm AV fistula with questionable malfunctioning -Management as per vascular  Diabetes mellitus -Glycemic  control  Dyslipidemia -Statin  Anemia -Hemoglobin more than 8 g/dL  Thrombocytopenia -Monitor Plat  Full code  DVT and GI prophylaxis.  Continue with supportive care  Critical care time 40 minutes

## 2018-05-12 NOTE — Consult Note (Signed)
PHARMACY CONSULT NOTE - INITIAL   Pharmacy Consult for Electrolyte Monitoring and Replacement   Labs: Recent Labs    05/11/18 1922 05/11/18 2105  05/12/18 0225 05/12/18 0356 05/12/18 0624 05/12/18 1005 05/12/18 1413  WBC 12.3*  --   --  13.1*  --   --   --   --   HGB 9.0*  --   --  8.7*  --   --   --   --   HCT 27.5*  --   --  26.4*  --   --   --   --   PLT 207  --   --  143*  --   --   --   --   APTT  --  27  --   --  29  --   --   --   CREATININE 5.73*  --    < > 5.76*  --  5.30* 5.55* 5.57*  MG 1.7  --   --  1.9  --   --   --   --   PHOS 5.9*  --   --   --   --   --   --   --   ALBUMIN 2.8*  --   --   --   --   --   --   --   PROT 5.8*  --   --   --   --   --   --   --   AST 39  --   --   --   --   --   --   --   ALT 32  --   --   --   --   --   --   --   ALKPHOS 42  --   --   --   --   --   --   --   BILITOT 0.5  --   --   --   --   --   --   --    < > = values in this interval not displayed.   Potassium (mmol/L)  Date Value  05/12/2018 3.4 (L)  06/14/2014 3.6   Magnesium (mg/dL)  Date Value  05/12/2018 1.9  12/10/2013 1.8   Calcium (mg/dL)  Date Value  05/12/2018 7.9 (L)   Calcium, Total (mg/dL)  Date Value  06/14/2014 8.4 (L)   Albumin (g/dL)  Date Value  05/11/2018 2.8 (L)  06/12/2014 2.9 (L)   Phosphorus (mg/dL)  Date Value  05/11/2018 5.9 (H)  ] Correct Calcium: 8.86  Estimated Creatinine Clearance: 11.1 mL/min (A) (by C-G formula based on SCr of 5.57 mg/dL (H)).   Medical History: Past Medical History:  Diagnosis Date  . Anemia 04/2018   low iron. to be started on supplements  . CKD (chronic kidney disease)    Stage IV  . Diabetes mellitus without complication ([place])    type II  . ESRD (end stage renal disease) ([place])   . Heart murmur    followed as a child only  . Hyperlipidemia associated with type 2 diabetes mellitus ([place])   . Hypertension   . Pancreatitis   . Peripheral vascular disease [facility])     Assessment: Pharmacy  consulted for electrolyte monitoring and replacement in 62 yo female with PMH of ESRD.   Goal of Therapy:  Electrolytes WNL  Plan:  8/31 14:00 K 3.4 - not all of the previously ordered KCl has  been administered. Finish the runs as ordered and will follow up at 18:00.    Laural Benes, PharmD, BCPS Clinical Pharmacist 05/12/2018 3:28 PM

## 2018-05-12 NOTE — Progress Notes (Signed)
MEDICATION RELATED CONSULT NOTE - INITIAL   Pharmacy Consult for Electrolytes  Indication:  ESRD  Allergies  Allergen Reactions  . Hctz [Hydrochlorothiazide]     pancreatitis  . Amlodipine Swelling    Knees down to ankles  . Amlodipine Besylate Swelling  . Sulfa Antibiotics Rash  . Dairy Aid [Lactase]     Runny nose    Patient Measurements: Height: 5' 3.5" (161.3 cm) Weight: 182 lb 15.7 oz (83 kg) IBW/kg (Calculated) : 53.55 Adjusted Body Weight:   Vital Signs: Temp: 91.4 F (33 C) (08/31 2200) Temp Source: Core (08/31 2200) BP: 132/64 (08/31 2200) Pulse Rate: 57 (08/31 2200) Intake/Output from previous day: 08/30 0701 - 08/31 0700 In: 1127.3 [I.V.:1027.3; IV Piggyback:100] Out: 910 [Urine:900; Blood:10] Intake/Output from this shift: Total I/O In: 279 [I.V.:179.1; IV Piggyback:99.9] Out: 109 [Urine:58; Emesis/NG output:25]  Labs: Recent Labs    05/11/18 1922 05/11/18 2105  05/12/18 0225 05/12/18 0356  05/12/18 1413 05/12/18 1806 05/12/18 2204  WBC 12.3*  --   --  13.1*  --   --   --   --   --   HGB 9.0*  --   --  8.7*  --   --   --   --   --   HCT 27.5*  --   --  26.4*  --   --   --   --   --   PLT 207  --   --  143*  --   --   --   --   --   APTT  --  27  --   --  29  --   --   --   --   CREATININE 5.73*  --    < > 5.76*  --    < > 5.57* 5.70* 5.69*  MG 1.7  --   --  1.9  --   --   --   --   --   PHOS 5.9*  --   --   --   --   --   --   --   --   ALBUMIN 2.8*  --   --   --   --   --   --   --   --   PROT 5.8*  --   --   --   --   --   --   --   --   AST 39  --   --   --   --   --   --   --   --   ALT 32  --   --   --   --   --   --   --   --   ALKPHOS 42  --   --   --   --   --   --   --   --   BILITOT 0.5  --   --   --   --   --   --   --   --    < > = values in this interval not displayed.   Estimated Creatinine Clearance: 10.9 mL/min (A) (by C-G formula based on SCr of 5.69 mg/dL (H)).   Microbiology: Recent Results (from the past 720 hour(s))   MRSA PCR Screening     Status: None   Collection Time: 05/11/18  7:56 PM  Result Value Ref Range Status   MRSA by PCR NEGATIVE NEGATIVE Final  Comment:        The GeneXpert MRSA Assay (FDA approved for NASAL specimens only), is one component of a comprehensive MRSA colonization surveillance program. It is not intended to diagnose MRSA infection nor to guide or monitor treatment for MRSA infections. Performed at [facility], [address] [place], [place] [address]     Medical History: Past Medical History:  Diagnosis Date  . Anemia 04/2018   low iron. to be started on supplements  . CKD (chronic kidney disease)    Stage IV  . Diabetes mellitus without complication ([place])    type II  . ESRD (end stage renal disease) ([place])   . Heart murmur    followed as a child only  . Hyperlipidemia associated with type 2 diabetes mellitus ([place])   . Hypertension   . Pancreatitis   . Peripheral vascular disease (HCC)     Medications:  Medications Prior to Admission  Medication Sig Dispense Refill Last Dose  . atorvastatin (LIPITOR) 40 MG tablet Take 40 mg by mouth daily at 6 PM.    05/10/2018 at Unknown time  . bumetanide (BUMEX) 1 MG tablet Take 2 mg by mouth 2 (two) times daily.    05/10/2018 at Unknown time  . carvedilol (COREG CR) 40 MG 24 hr capsule Take 40 mg by mouth daily. Takes in the morning when she gets home from work   05/11/2018 at 0730  . diltiazem (CARDIZEM LA) 120 MG 24 hr tablet Take 1 tablet (120 mg total) by mouth daily. (Patient taking differently: Take 120 mg by mouth daily. Takes in the morning when she gets home from work) 30 tablet 1 05/11/2018 at Unknown time  . fluticasone (FLONASE) 50 MCG/ACT nasal spray 2 sprays by Each Nare route daily.   Past Month at Unknown time  . hydrALAZINE (APRESOLINE) 50 MG tablet Take 50 mg by mouth every 8 (eight) hours.    05/11/2018 at Unknown time  . Insulin Glargine 300 UNIT/ML SOPN Inject 40 Units into the skin daily.  In the morning.  (toujeo)   05/10/2018 at Unknown time  . amLODipine (NORVASC) 5 MG tablet Take 1 tablet by mouth once daily   Not Taking at Unknown time  . aspirin EC 81 MG tablet Take 81 mg by mouth daily.   Not Taking at Unknown time  . insulin aspart (NOVOLOG) 100 UNIT/ML FlexPen Inject 10-12 Units into the skin 3 (three) times daily with meals.    Not Taking at Unknown time  . losartan (COZAAR) 100 MG tablet Take 1 tablet by mouth once daily   Not Taking at Unknown time  . metoprolol (LOPRESSOR) 50 MG tablet Take 50 mg by mouth daily.    Taking  . ondansetron (ZOFRAN) 4 MG tablet Take 1 tablet (4 mg total) by mouth every 6 (six) hours as needed for nausea. (Patient not taking: Reported on 05/04/2018) 20 tablet 0 Not Taking at Unknown time  . oxyCODONE (OXY IR/ROXICODONE) 5 MG immediate release tablet Take 1 tablet (5 mg total) by mouth every 4 (four) hours as needed for moderate pain. (Patient not taking: Reported on 05/04/2018) 30 tablet 0 Not Taking at Unknown time  . spironolactone (ALDACTONE) 50 MG tablet Take 50 mg by mouth daily.   Not Taking at Unknown time  . Vitamin D, Ergocalciferol, (DRISDOL) 50000 units CAPS capsule TAKE ONE CAPSULE BY MOUTH ONE TIME PER WEEK  0 Not Taking at Unknown time    Assessment: Pharmacy consulted for electrolyte  monitoring and replacement in 62 yo female with PMH of ESRD.   Goal of Therapy:  Electrolytes WNL  Plan:  8/31 14:00 K 3.4 - not all of the previously ordered KCl has been administered. Finish the runs as ordered and will follow up at 18:00.    8/31:  K @ 18:00 = 3.3  Will order KCl 10 mEq IV X 4 and recheck K on 8/31 @ 22:00.   8/31:  K @ 22:00 = 3.5 No additional K supplementation needed.   Will recheck electrolytes in 4 hrs on 9/1 @ 0200.   Magdalena Skilton D 05/12/2018,10:20 PM

## 2018-05-12 NOTE — Consult Note (Signed)
PHARMACY CONSULT NOTE - INITIAL   Pharmacy Consult for Electrolyte Monitoring and Replacement   Labs: Recent Labs    05/11/18 1922 05/11/18 2105 05/11/18 2242 05/12/18 0225 05/12/18 0356  WBC 12.3*  --   --  13.1*  --   HGB 9.0*  --   --  8.7*  --   HCT 27.5*  --   --  26.4*  --   PLT 207  --   --  143*  --   APTT  --  27  --   --  29  CREATININE 5.73*  --  5.49* 5.76*  --   MG 1.7  --   --  1.9  --   PHOS 5.9*  --   --   --   --   ALBUMIN 2.8*  --   --   --   --   PROT 5.8*  --   --   --   --   AST 39  --   --   --   --   ALT 32  --   --   --   --   ALKPHOS 42  --   --   --   --   BILITOT 0.5  --   --   --   --    Potassium (mmol/L)  Date Value  05/12/2018 4.1  06/14/2014 3.6   Magnesium (mg/dL)  Date Value  05/12/2018 1.9  12/10/2013 1.8   Calcium (mg/dL)  Date Value  05/12/2018 7.9 (L)   Calcium, Total (mg/dL)  Date Value  06/14/2014 8.4 (L)   Albumin (g/dL)  Date Value  05/11/2018 2.8 (L)  06/12/2014 2.9 (L)   Phosphorus (mg/dL)  Date Value  05/11/2018 5.9 (H)  ] Correct Calcium: 8.86  Estimated Creatinine Clearance: 10.6 mL/min (A) (by C-G formula based on SCr of 5.76 mg/dL (H)).   Medical History: Past Medical History:  Diagnosis Date  . Anemia 04/2018   low iron. to be started on supplements  . CKD (chronic kidney disease)    Stage IV  . Diabetes mellitus without complication ([place])    type II  . ESRD (end stage renal disease) ([place])   . Heart murmur    followed as a child only  . Hyperlipidemia associated with type 2 diabetes mellitus ([place])   . Hypertension   . Pancreatitis   . Peripheral vascular disease [facility])     Assessment: Pharmacy consulted for electrolyte monitoring and replacement in 62 yo female with PMH of ESRD.   Goal of Therapy:  Electrolytes WNL  Plan:  8/31 AM: No replacement needed at this time.  Pharmacy will continue to follow labs and replace electrolyte as needed.  Pernell Dupre, PharmD, BCPS Clinical  Pharmacist 05/12/2018 4:28 AM

## 2018-05-12 NOTE — Progress Notes (Signed)
    Subjective  - POD #1  Remains on cooling protocol   Physical Exam:  Left arm fistula has thrombosed       Assessment/Plan:  POD #1  Continue supportive care Fistula has occluded.  Will re-evaluate for HD access once she has recovered  Tracy Jennings 05/12/2018 3:59 PM --  Vitals:   05/12/18 1500 05/12/18 1530  BP: 123/64 133/63  Pulse: 61 60  Resp: 20 20  Temp: (!) 91.2 F (32.9 C)   SpO2: 100% 100%    Intake/Output Summary (Last 24 hours) at 05/12/2018 1559 Last data filed at 05/12/2018 1500 Gross per 24 hour  Intake 1542.19 ml  Output 1363 ml  Net 179.19 ml     Laboratory CBC    Component Value Date/Time   WBC 13.1 (H) 05/12/2018 0225   HGB 8.7 (L) 05/12/2018 0225   HGB 11.8 (L) 06/14/2014 0543   HCT 26.4 (L) 05/12/2018 0225   HCT 36.1 06/14/2014 0543   PLT 143 (L) 05/12/2018 0225   PLT 236 06/14/2014 0543    BMET    Component Value Date/Time   NA 146 (H) 05/12/2018 1413   NA 139 06/14/2014 0543   K 3.4 (L) 05/12/2018 1413   K 3.6 06/14/2014 0543   CL 112 (H) 05/12/2018 1413   CL 105 06/14/2014 0543   CO2 24 05/12/2018 1413   CO2 29 06/14/2014 0543   GLUCOSE 153 (H) 05/12/2018 1413   GLUCOSE 152 (H) 06/14/2014 0543   BUN 58 (H) 05/12/2018 1413   BUN 21 (H) 06/14/2014 0543   CREATININE 5.57 (H) 05/12/2018 1413   CREATININE 1.04 06/14/2014 0543   CALCIUM 7.9 (L) 05/12/2018 1413   CALCIUM 8.4 (L) 06/14/2014 0543   GFRNONAA 8 (L) 05/12/2018 1413   GFRNONAA 58 (L) 06/14/2014 0543   GFRNONAA >60 12/10/2013 0407   GFRAA 9 (L) 05/12/2018 1413   GFRAA >60 06/14/2014 0543   GFRAA >60 12/10/2013 0407    COAG Lab Results  Component Value Date   INR 1.04 05/12/2018   INR 1.07 05/11/2018   INR 0.97 05/04/2018   No results found for: PTT  Antibiotics Anti-infectives (From admission, onward)   Start     Dose/Rate Route Frequency Ordered Stop   05/11/18 1830  ceFAZolin (ANCEF) IVPB 1 g/50 mL premix     1 g 100 mL/hr over 30 Minutes  Intravenous Every 8 hours 05/11/18 1711 05/12/18 0959   05/11/18 0817  clindamycin (CLEOCIN) 900 MG/50ML IVPB    Note to Pharmacy:  Ronnell Freshwater   : cabinet override      05/11/18 0817 05/11/18 1043   05/11/18 0817  ceFAZolin (ANCEF) 2-4 GM/100ML-% IVPB    Note to Pharmacy:  Ronnell Freshwater   : cabinet override      05/11/18 0817 05/11/18 1051   05/11/18 0600  ceFAZolin (ANCEF) IVPB 2g/100 mL premix     2 g 200 mL/hr over 30 Minutes Intravenous On call to O.R. 05/10/18 2147 05/11/18 1121   05/11/18 0600  clindamycin (CLEOCIN) IVPB 900 mg     900 mg 100 mL/hr over 30 Minutes Intravenous On call to O.R. 05/10/18 2147 05/11/18 1113       V. Leia Alf, M.D. [facility] Office: [phone] Pager:  [phone]

## 2018-05-12 NOTE — Progress Notes (Signed)
MEDICATION RELATED CONSULT NOTE - INITIAL   Pharmacy Consult for Electrolytes  Indication:  ESRD  Allergies  Allergen Reactions  . Hctz [Hydrochlorothiazide]     pancreatitis  . Amlodipine Swelling    Knees down to ankles  . Amlodipine Besylate Swelling  . Sulfa Antibiotics Rash  . Dairy Aid [Lactase]     Runny nose    Patient Measurements: Height: 5' 3.5" (161.3 cm) Weight: 182 lb 15.7 oz (83 kg) IBW/kg (Calculated) : 53.55 Adjusted Body Weight:   Vital Signs: Temp: 91.4 F (33 C) (08/31 1900) Temp Source: Bladder (08/31 1900) BP: 127/61 (08/31 1900) Pulse Rate: 57 (08/31 1900) Intake/Output from previous day: 08/30 0701 - 08/31 0700 In: 1127.3 [I.V.:1027.3; IV Piggyback:100] Out: 910 [Urine:900; Blood:10] Intake/Output from this shift: No intake/output data recorded.  Labs: Recent Labs    05/11/18 1922 05/11/18 2105  05/12/18 0225 05/12/18 0356  05/12/18 1005 05/12/18 1413 05/12/18 1806  WBC 12.3*  --   --  13.1*  --   --   --   --   --   HGB 9.0*  --   --  8.7*  --   --   --   --   --   HCT 27.5*  --   --  26.4*  --   --   --   --   --   PLT 207  --   --  143*  --   --   --   --   --   APTT  --  27  --   --  29  --   --   --   --   CREATININE 5.73*  --    < > 5.76*  --    < > 5.55* 5.57* 5.70*  MG 1.7  --   --  1.9  --   --   --   --   --   PHOS 5.9*  --   --   --   --   --   --   --   --   ALBUMIN 2.8*  --   --   --   --   --   --   --   --   PROT 5.8*  --   --   --   --   --   --   --   --   AST 39  --   --   --   --   --   --   --   --   ALT 32  --   --   --   --   --   --   --   --   ALKPHOS 42  --   --   --   --   --   --   --   --   BILITOT 0.5  --   --   --   --   --   --   --   --    < > = values in this interval not displayed.   Estimated Creatinine Clearance: 10.8 mL/min (A) (by C-G formula based on SCr of 5.7 mg/dL (H)).   Microbiology: Recent Results (from the past 720 hour(s))  MRSA PCR Screening     Status: None   Collection Time:  05/11/18  7:56 PM  Result Value Ref Range Status   MRSA by PCR NEGATIVE NEGATIVE Final    Comment:  The GeneXpert MRSA Assay (FDA approved for NASAL specimens only), is one component of a comprehensive MRSA colonization surveillance program. It is not intended to diagnose MRSA infection nor to guide or monitor treatment for MRSA infections. Performed at [facility], [address], [place], [place] [address]     Medical History: Past Medical History:  Diagnosis Date  . Anemia 04/2018   low iron. to be started on supplements  . CKD (chronic kidney disease)    Stage IV  . Diabetes mellitus without complication ([place])    type II  . ESRD (end stage renal disease) ([place])   . Heart murmur    followed as a child only  . Hyperlipidemia associated with type 2 diabetes mellitus ([place])   . Hypertension   . Pancreatitis   . Peripheral vascular disease (HCC)     Medications:  Medications Prior to Admission  Medication Sig Dispense Refill Last Dose  . atorvastatin (LIPITOR) 40 MG tablet Take 40 mg by mouth daily at 6 PM.    05/10/2018 at Unknown time  . bumetanide (BUMEX) 1 MG tablet Take 2 mg by mouth 2 (two) times daily.    05/10/2018 at Unknown time  . carvedilol (COREG CR) 40 MG 24 hr capsule Take 40 mg by mouth daily. Takes in the morning when she gets home from work   05/11/2018 at 0730  . diltiazem (CARDIZEM LA) 120 MG 24 hr tablet Take 1 tablet (120 mg total) by mouth daily. (Patient taking differently: Take 120 mg by mouth daily. Takes in the morning when she gets home from work) 30 tablet 1 05/11/2018 at Unknown time  . fluticasone (FLONASE) 50 MCG/ACT nasal spray 2 sprays by Each Nare route daily.   Past Month at Unknown time  . hydrALAZINE (APRESOLINE) 50 MG tablet Take 50 mg by mouth every 8 (eight) hours.    05/11/2018 at Unknown time  . Insulin Glargine 300 UNIT/ML SOPN Inject 40 Units into the skin daily. In the morning.  (toujeo)   05/10/2018 at Unknown time  .  amLODipine (NORVASC) 5 MG tablet Take 1 tablet by mouth once daily   Not Taking at Unknown time  . aspirin EC 81 MG tablet Take 81 mg by mouth daily.   Not Taking at Unknown time  . insulin aspart (NOVOLOG) 100 UNIT/ML FlexPen Inject 10-12 Units into the skin 3 (three) times daily with meals.    Not Taking at Unknown time  . losartan (COZAAR) 100 MG tablet Take 1 tablet by mouth once daily   Not Taking at Unknown time  . metoprolol (LOPRESSOR) 50 MG tablet Take 50 mg by mouth daily.    Taking  . ondansetron (ZOFRAN) 4 MG tablet Take 1 tablet (4 mg total) by mouth every 6 (six) hours as needed for nausea. (Patient not taking: Reported on 05/04/2018) 20 tablet 0 Not Taking at Unknown time  . oxyCODONE (OXY IR/ROXICODONE) 5 MG immediate release tablet Take 1 tablet (5 mg total) by mouth every 4 (four) hours as needed for moderate pain. (Patient not taking: Reported on 05/04/2018) 30 tablet 0 Not Taking at Unknown time  . spironolactone (ALDACTONE) 50 MG tablet Take 50 mg by mouth daily.   Not Taking at Unknown time  . Vitamin D, Ergocalciferol, (DRISDOL) 50000 units CAPS capsule TAKE ONE CAPSULE BY MOUTH ONE TIME PER WEEK  0 Not Taking at Unknown time    Assessment: Pharmacy consulted for electrolyte monitoring and replacement in 62 yo female with  PMH of ESRD.   Goal of Therapy:  Electrolytes WNL  Plan:  8/31 14:00 K 3.4 - not all of the previously ordered KCl has been administered. Finish the runs as ordered and will follow up at 18:00.    8/31:  K @ 18:00 = 3.3  Will order KCl 10 mEq IV X 4 and recheck K on 8/31 @ 22:00.   Larance Ratledge D 05/12/2018,7:04 PM

## 2018-05-12 NOTE — Progress Notes (Signed)
[facility]  ROUNDING NOTE   Subjective:   [name] admitted to Tracy Jennings. Patient came for elective AVF placement by Dr. Delana Meyer on 05/11/2018  Unfortunately after the procedure, patient was found to have not have thrill or bruit. Patient was then taken back to the OR where patient's fistula was occluded. Procedure went well.   Code blue called yesterday evening. Placed on hypothermic protocol. Troponin mildly elevated.   Patient received succinyl choline x 2. Potassium was 5.3 after cardiac arrest.   Objective:  Vital signs in last 24 hours:  Temp:  [90.3 F (32.4 C)-99 F (37.2 C)] 91.6 F (33.1 C) (08/31 0930) Pulse Rate:  [63-96] 63 (08/31 0930) Resp:  [0-24] 18 (08/31 0930) BP: (100-211)/(53-91) 147/65 (08/31 0930) SpO2:  [94 %-100 %] 100 % (08/31 0930) FiO2 (%):  [30 %-50 %] 30 % (08/31 0742) Weight:  [81.6 kg-83 kg] 83 kg (08/31 0500)  Weight change:  Filed Weights   05/11/18 0836 05/11/18 2013 05/12/18 0500  Weight: 80.4 kg 81.6 kg 83 kg    Intake/Output: I/O last 3 completed shifts: In: 1127.3 [I.V.:1027.3; IV Piggyback:100] Out: 910 [Urine:900; Blood:10]   Intake/Output this shift:  Total I/O In: 228.1 [I.V.:228.1] Out: 190 [Urine:190]  Physical Exam: General: Critically ill  Head: Normocephalic, atraumatic. ETT  Eyes: Anicteric, PERRL  Neck: Supple, trachea midline  Lungs:  Clear to auscultation  Heart: Regular rate and rhythm  Abdomen:  Soft, nontender,   Extremities: no peripheral edema.  Neurologic: Intubated and sedate  Skin: No lesions  Access: Left AVF - no thrill or bruit appreciate.     Basic Metabolic Panel: Recent Labs  Lab 05/11/18 1922 05/11/18 2242 05/12/18 0225 05/12/18 0624  NA 139 145 141 143  K 5.3* 4.9 4.1 3.5  CL 111 109 109 110  CO2 21* 26 22 20*  GLUCOSE 192* 221* 235* 213*  BUN 59* 60* 61* 60*  CREATININE 5.73* 5.49* 5.76* 5.30*  CALCIUM 8.0* 8.2* 7.9* 7.8*  MG 1.7  --  1.9  --   PHOS 5.9*   --   --   --     Liver Function Tests: Recent Labs  Lab 05/11/18 1922  AST 39  ALT 32  ALKPHOS 42  BILITOT 0.5  PROT 5.8*  ALBUMIN 2.8*   No results for input(s): LIPASE, AMYLASE in the last 168 hours. No results for input(s): AMMONIA in the last 168 hours.  CBC: Recent Labs  Lab 05/11/18 1922 05/12/18 0225  WBC 12.3* 13.1*  NEUTROABS 11.2*  --   HGB 9.0* 8.7*  HCT 27.5* 26.4*  MCV 90.5 89.7  PLT 207 143*    Cardiac Enzymes: Recent Labs  Lab 05/11/18 1922 05/11/18 2105 05/12/18 0225 05/12/18 0818  TROPONINI 0.03* 0.05* 0.08* 0.07*    BNP: Invalid input(s): POCBNP  CBG: Recent Labs  Lab 05/12/18 0312 05/12/18 0408 05/12/18 0505 05/12/18 0606 05/12/18 0804  GLUCAP 237* 28* 59* 201* 132*    Microbiology: Results for orders placed or performed during the hospital encounter of 05/11/18  MRSA PCR Screening     Status: None   Collection Time: 05/11/18  7:56 PM  Result Value Ref Range Status   MRSA by PCR NEGATIVE NEGATIVE Final    Comment:        The GeneXpert MRSA Assay (FDA approved for NASAL specimens only), is one component of a comprehensive MRSA colonization surveillance program. It is not intended to diagnose MRSA infection nor to guide or  monitor treatment for MRSA infections. Performed at [facility], [address] [place], [place] [address]     Coagulation Studies: Recent Labs    05/11/18 Oct 16, 2103 05/12/18 0356  LABPROT 13.8 13.5  INR 1.07 1.04    Urinalysis: No results for input(s): COLORURINE, LABSPEC, PHURINE, GLUCOSEU, HGBUR, BILIRUBINUR, KETONESUR, PROTEINUR, UROBILINOGEN, NITRITE, LEUKOCYTESUR in the last 72 hours.  Invalid input(s): APPERANCEUR    Imaging: Dg Abd 1 View  Result Date: 05/11/2018 CLINICAL DATA:  Orogastric tube placement. EXAM: ABDOMEN - 1 VIEW COMPARISON:  CT abdomen and pelvis August 07, 2016 FINDINGS: Nasogastric tube tip projects in mid stomach, side port past GE junction region.  Included bowel gas pattern is nondilated and nonobstructive. Surgical clips in the included right abdomen compatible with cholecystectomy. Included soft tissue planes and osseous structures are non suspicious. IMPRESSION: Nasogastric tube tip projects mid stomach. Electronically Signed   By: Elon Alas M.D.   On: 05/11/2018 22:37   Ct Head Wo Contrast  Result Date: 05/11/2018 CLINICAL DATA:  Cardiac arrest. EXAM: CT HEAD WITHOUT CONTRAST TECHNIQUE: Contiguous axial images were obtained from the base of the skull through the vertex without intravenous contrast. COMPARISON:  None. FINDINGS: Brain: No evidence of acute infarction, hemorrhage, hydrocephalus, extra-axial collection or mass lesion/mass effect. Mild generalized cerebral atrophy. Vascular: No hyperdense vessel or unexpected calcification. Skull: Normal. Negative for fracture or focal lesion. Sinuses/Orbits: Scattered ethmoid air cell mucosal thickening with small air-fluid levels. Small air-fluid level in the right sphenoid sinus. Mastoid air cells are clear. Other: None. IMPRESSION: 1.  No acute intracranial abnormality. Electronically Signed   By: Titus Dubin M.D.   On: 05/11/2018 20:02   Dg Chest Port 1 View  Result Date: 05/11/2018 CLINICAL DATA:  Central line placement. EXAM: PORTABLE CHEST 1 VIEW COMPARISON:  Chest radiograph May 11, 2018 at 1808 hours FINDINGS: No central line identified. Endotracheal tube tip projects 4.6 cm above the carina. Nasogastric tube past proximal stomach, out of field of view. Stable cardiomegaly with interstitial prominence and patchy RIGHT upper lobe airspace opacities. No pleural effusion. No pneumothorax. Soft tissue planes and included osseous structures are unchanged. IMPRESSION: 1. No central line identified. Endotracheal tube tip projects 4.6 cm above the carina. Nasogastric tube past proximal stomach. 2. Stable cardiomegaly. Increasing interstitial and RIGHT alveolar airspace opacities, this  could reflect pulmonary edema and/or pneumonia. Electronically Signed   By: Elon Alas M.D.   On: 05/11/2018 22:36   Dg Chest Port 1 View  Result Date: 05/11/2018 CLINICAL DATA:  Intubation. EXAM: PORTABLE CHEST 1 VIEW COMPARISON:  Chest x-ray dated October 15, 2017. FINDINGS: Interval placement of an endotracheal tube with the tip approximately 1 cm above the carina. Cardiomegaly. Bilateral perihilar hazy opacities. No focal consolidation, pleural effusion, or pneumothorax. Gaseous distention of the stomach. No acute osseous abnormality. IMPRESSION: 1. Endotracheal tube tip 1 cm above the carina. Consider retraction 2-3 cm. 2. Cardiomegaly with hazy perihilar opacities, suspicious for mild pulmonary edema. Electronically Signed   By: Titus Dubin M.D.   On: 05/11/2018 18:26     Medications:   . sodium chloride 10 mL/hr at 05/12/18 0900  .  ceFAZolin (ANCEF) IV Stopped (05/12/18 0235)  . cisatracurium (NIMBEX) infusion 1 mcg/kg/min (05/12/18 0900)  . famotidine (PEPCID) IV    . fentaNYL infusion INTRAVENOUS 250 mcg/hr (05/12/18 0900)  . norepinephrine (LEVOPHED) Adult infusion Stopped (05/12/18 0231)  . propofol (DIPRIVAN) infusion 75 mcg/kg/min (05/12/18 0900)   . artificial tears  1 application Both  Eyes Q8H  . aspirin EC  81 mg Oral Daily  . aspirin  300 mg Rectal STAT  . atorvastatin  40 mg Oral q1800  . chlorhexidine gluconate (MEDLINE KIT)  15 mL Mouth Rinse BID  . clopidogrel  300 mg Oral STAT  . clopidogrel  75 mg Oral Daily  . diltiazem  120 mg Oral Daily  . docusate sodium  100 mg Oral BID  . insulin glargine  40 Units Subcutaneous Daily  . mouth rinse  15 mL Mouth Rinse 10 times per day   acetaminophen **OR** acetaminophen, [COMPLETED] cisatracurium **AND** cisatracurium (NIMBEX) infusion **AND** cisatracurium, fentaNYL, hydrALAZINE, magnesium hydroxide, sorbitol  Assessment/ Plan:  Tracy Jennings is a 62 y.o. black female with dialysis mellitus type II  insulin dependent, hypertension, hyperlipidemia, peripheral vascular disease, chronic pancreatitis, anemia of chronic kidney disease admitted on 05/11/2018 for CHRONIC KIDNEY DISEASE  1. Chronic kidney disease stage V: with metabolic acidosis. With history of nephrotic range proteinuria.  Baseline creatinine of 4.68, GFR of 11.  Chronic kidney disease secondary to diabetic nephropathy.  No acute indication for dialysis. However with hypoxic, hypotensive event. Low threshold for initiating dialysis.  Discussed case with Husband who wants aggressive measures.  - Appreciate vascular and critical care input.   2. Hypertension: echocardiogram pending. Home regimen of bumetanide, carvedilol, diltiazem, hydralazine, losartan, metoprolol, and spironolactone.  - Discontinue bumex, spironolactone, hydralazine and carvedilol for now - Norepinephrine ordered in case of hypotension.   3. Cardiac arrest: placed on hypothermic protocol.   4. Anemia of chronic kidney disease: normocytic. Hemoglobin 8.7 Hold ESA due to ischemia  5. Secondary Hyperparathyroidism with hypocalcemia. PTH 365 on 04/04/18. Not currently on binders or vitamin D agent - Check serum phosphorus tomorrow.   6. Diabetes mellitus type II with chronic kidney disease: insulin dependent. History of poor control. Hemoglobin A1c 6.6% on 02/26/18.     LOS: 1   8/31/20199:46 AM

## 2018-05-12 NOTE — Anesthesia Postprocedure Evaluation (Signed)
Anesthesia Post Note  Patient: Tracy Jennings  Procedure(s) Performed: THROMBECTOMY ARTERIOVENOUS FISTULA  Patient location during evaluation: SICU Anesthesia Type: General Level of consciousness: sedated Pain management: pain level controlled Vital Signs Assessment: post-procedure vital signs reviewed and stable Respiratory status: patient remains intubated per anesthesia plan Cardiovascular status: stable     Last Vitals:  Vitals:   05/12/18 0730 05/12/18 0800  BP: (!) 128/59 130/60  Pulse: 64 65  Resp: (!) 0 (!) 0  Temp:    SpO2: 100% 100%    Last Pain:  Vitals:   05/12/18 0630  TempSrc: Core  PainSc:                  Tracy Jennings

## 2018-05-12 NOTE — Progress Notes (Signed)
Chaplain received PG for code blue. Chaplain went to OR waiting room with patient husband Braulio Conte). Doctor came in a told Braulio Conte patient was moving to ICU. Chaplain escorted Braulio Conte to ICU waiting room where family began to arrive. Chaplain stayed with family until everyone was able to see patient. Chaplain offered emotional, spiritual support. The patient pastor came and offered the prayer with family as Chaplain continued to assist family visit with patient.

## 2018-05-13 LAB — PHOSPHORUS: Phosphorus: 4.2 mg/dL (ref 2.5–4.6)

## 2018-05-13 LAB — BLOOD GAS, VENOUS
Acid-Base Excess: 0.4 mmol/L (ref 0.0–2.0)
Acid-base deficit: 4.1 mmol/L — ABNORMAL HIGH (ref 0.0–2.0)
Acid-base deficit: 5.7 mmol/L — ABNORMAL HIGH (ref 0.0–2.0)
Bicarbonate: 21.7 mmol/L (ref 20.0–28.0)
Bicarbonate: 22.1 mmol/L (ref 20.0–28.0)
Bicarbonate: 20.7 mmol/L (ref 20.0–28.0)
FIO2: 0.3
FIO2: 0.3
FIO2: 0.3
MECHVT: 500 mL
MECHVT: 500 mL
MECHVT: 500 mL
Mechanical Rate: 20
Mechanical Rate: 12
O2 Saturation: 82.9 %
O2 Saturation: 72.8 %
O2 Saturation: 73.4 %
PEEP: 5 cmH2O
PEEP: 5 cmH2O
PEEP: 5 cmH2O
Patient temperature: 37
Patient temperature: 37
Patient temperature: 37
RATE: 12 resp/min
pCO2, Ven: 26 mmHg — ABNORMAL LOW (ref 44.0–60.0)
pCO2, Ven: 45 mmHg (ref 44.0–60.0)
pCO2, Ven: 43 mmHg — ABNORMAL LOW (ref 44.0–60.0)
pH, Ven: 7.53 — ABNORMAL HIGH (ref 7.250–7.430)
pH, Ven: 7.3 (ref 7.250–7.430)
pH, Ven: 7.29 (ref 7.250–7.430)
pO2, Ven: 41 mmHg (ref 32.0–45.0)
pO2, Ven: 43 mmHg (ref 32.0–45.0)
pO2, Ven: 44 mmHg (ref 32.0–45.0)

## 2018-05-13 LAB — LACTIC ACID, PLASMA
Lactic Acid, Venous: 0.9 mmol/L (ref 0.5–1.9)
Lactic Acid, Venous: 0.6 mmol/L (ref 0.5–1.9)

## 2018-05-13 LAB — BLOOD GAS, ARTERIAL
Acid-Base Excess: 1.2 mmol/L (ref 0.0–2.0)
Bicarbonate: 21.1 mmol/L (ref 20.0–28.0)
FIO2: 0.3
MECHVT: 500 mL
Mechanical Rate: 20
O2 Saturation: 99.2 %
PEEP: 5 cmH2O
Patient temperature: 37
pCO2 arterial: 21 mmHg — ABNORMAL LOW (ref 32.0–48.0)
pH, Arterial: 7.61 (ref 7.350–7.450)
pO2, Arterial: 118 mmHg — ABNORMAL HIGH (ref 83.0–108.0)

## 2018-05-13 LAB — BASIC METABOLIC PANEL
Anion gap: 10 (ref 5–15)
Anion gap: 10 (ref 5–15)
Anion gap: 9 (ref 5–15)
Anion gap: 8 (ref 5–15)
Anion gap: 8 (ref 5–15)
Anion gap: 7 (ref 5–15)
BUN: 64 mg/dL — ABNORMAL HIGH (ref 6–20)
BUN: 61 mg/dL — ABNORMAL HIGH (ref 6–20)
BUN: 60 mg/dL — ABNORMAL HIGH (ref 6–20)
BUN: 61 mg/dL — ABNORMAL HIGH (ref 6–20)
BUN: 58 mg/dL — ABNORMAL HIGH (ref 6–20)
BUN: 59 mg/dL — ABNORMAL HIGH (ref 6–20)
CO2: 21 mmol/L — ABNORMAL LOW (ref 22–32)
CO2: 22 mmol/L (ref 22–32)
CO2: 22 mmol/L (ref 22–32)
CO2: 23 mmol/L (ref 22–32)
CO2: 21 mmol/L — ABNORMAL LOW (ref 22–32)
CO2: 21 mmol/L — ABNORMAL LOW (ref 22–32)
Calcium: 7.6 mg/dL — ABNORMAL LOW (ref 8.9–10.3)
Calcium: 7.5 mg/dL — ABNORMAL LOW (ref 8.9–10.3)
Calcium: 7.7 mg/dL — ABNORMAL LOW (ref 8.9–10.3)
Calcium: 7.6 mg/dL — ABNORMAL LOW (ref 8.9–10.3)
Calcium: 7.6 mg/dL — ABNORMAL LOW (ref 8.9–10.3)
Calcium: 7.2 mg/dL — ABNORMAL LOW (ref 8.9–10.3)
Chloride: 112 mmol/L — ABNORMAL HIGH (ref 98–111)
Chloride: 111 mmol/L (ref 98–111)
Chloride: 111 mmol/L (ref 98–111)
Chloride: 111 mmol/L (ref 98–111)
Chloride: 111 mmol/L (ref 98–111)
Chloride: 113 mmol/L — ABNORMAL HIGH (ref 98–111)
Creatinine, Ser: 5.59 mg/dL — ABNORMAL HIGH (ref 0.44–1.00)
Creatinine, Ser: 5.7 mg/dL — ABNORMAL HIGH (ref 0.44–1.00)
Creatinine, Ser: 5.7 mg/dL — ABNORMAL HIGH (ref 0.44–1.00)
Creatinine, Ser: 5.69 mg/dL — ABNORMAL HIGH (ref 0.44–1.00)
Creatinine, Ser: 5.63 mg/dL — ABNORMAL HIGH (ref 0.44–1.00)
Creatinine, Ser: 5.45 mg/dL — ABNORMAL HIGH (ref 0.44–1.00)
GFR calc Af Amer: 9 mL/min — ABNORMAL LOW (ref >60)
GFR calc Af Amer: 8 mL/min — ABNORMAL LOW (ref >60)
GFR calc Af Amer: 8 mL/min — ABNORMAL LOW (ref >60)
GFR calc Af Amer: 8 mL/min — ABNORMAL LOW (ref >60)
GFR calc Af Amer: 9 mL/min — ABNORMAL LOW (ref >60)
GFR calc Af Amer: 9 mL/min — ABNORMAL LOW (ref >60)
GFR calc non Af Amer: 7 mL/min — ABNORMAL LOW (ref >60)
GFR calc non Af Amer: 7 mL/min — ABNORMAL LOW (ref >60)
GFR calc non Af Amer: 7 mL/min — ABNORMAL LOW (ref >60)
GFR calc non Af Amer: 7 mL/min — ABNORMAL LOW (ref >60)
GFR calc non Af Amer: 7 mL/min — ABNORMAL LOW (ref >60)
GFR calc non Af Amer: 8 mL/min — ABNORMAL LOW (ref >60)
Glucose, Bld: 106 mg/dL — ABNORMAL HIGH (ref 70–99)
Glucose, Bld: 100 mg/dL — ABNORMAL HIGH (ref 70–99)
Glucose, Bld: 142 mg/dL — ABNORMAL HIGH (ref 70–99)
Glucose, Bld: 163 mg/dL — ABNORMAL HIGH (ref 70–99)
Glucose, Bld: 199 mg/dL — ABNORMAL HIGH (ref 70–99)
Glucose, Bld: 193 mg/dL — ABNORMAL HIGH (ref 70–99)
Potassium: 3.2 mmol/L — ABNORMAL LOW (ref 3.5–5.1)
Potassium: 3.6 mmol/L (ref 3.5–5.1)
Potassium: 4.1 mmol/L (ref 3.5–5.1)
Potassium: 4.5 mmol/L (ref 3.5–5.1)
Potassium: 4.6 mmol/L (ref 3.5–5.1)
Potassium: 4.3 mmol/L (ref 3.5–5.1)
Sodium: 143 mmol/L (ref 135–145)
Sodium: 143 mmol/L (ref 135–145)
Sodium: 142 mmol/L (ref 135–145)
Sodium: 142 mmol/L (ref 135–145)
Sodium: 140 mmol/L (ref 135–145)
Sodium: 141 mmol/L (ref 135–145)

## 2018-05-13 LAB — CBC WITH DIFFERENTIAL/PLATELET
Basophils Absolute: 0 10*3/uL (ref 0–0.1)
Basophils Relative: 1 %
Eosinophils Absolute: 0.1 10*3/uL (ref 0–0.7)
Eosinophils Relative: 2 %
HCT: 23.1 % — ABNORMAL LOW (ref 35.0–47.0)
Hemoglobin: 7.8 g/dL — ABNORMAL LOW (ref 12.0–16.0)
Lymphocytes Relative: 26 %
Lymphs Abs: 1.4 10*3/uL (ref 1.0–3.6)
MCH: 29.9 pg (ref 26.0–34.0)
MCHC: 34 g/dL (ref 32.0–36.0)
MCV: 87.8 fL (ref 80.0–100.0)
Monocytes Absolute: 0.4 10*3/uL (ref 0.2–0.9)
Monocytes Relative: 6 %
Neutro Abs: 3.7 10*3/uL (ref 1.4–6.5)
Neutrophils Relative %: 65 %
Platelets: 174 10*3/uL (ref 150–440)
RBC: 2.63 MIL/uL — ABNORMAL LOW (ref 3.80–5.20)
RDW: 14.2 % (ref 11.5–14.5)
WBC: 5.7 10*3/uL (ref 3.6–11.0)

## 2018-05-13 LAB — GLUCOSE, CAPILLARY
Glucose-Capillary: 102 mg/dL — ABNORMAL HIGH (ref 70–99)
Glucose-Capillary: 106 mg/dL — ABNORMAL HIGH (ref 70–99)
Glucose-Capillary: 125 mg/dL — ABNORMAL HIGH (ref 70–99)
Glucose-Capillary: 127 mg/dL — ABNORMAL HIGH (ref 70–99)
Glucose-Capillary: 128 mg/dL — ABNORMAL HIGH (ref 70–99)
Glucose-Capillary: 134 mg/dL — ABNORMAL HIGH (ref 70–99)
Glucose-Capillary: 141 mg/dL — ABNORMAL HIGH (ref 70–99)
Glucose-Capillary: 188 mg/dL — ABNORMAL HIGH (ref 70–99)
Glucose-Capillary: 90 mg/dL (ref 70–99)
Glucose-Capillary: 92 mg/dL (ref 70–99)
Glucose-Capillary: 95 mg/dL (ref 70–99)
Glucose-Capillary: 96 mg/dL (ref 70–99)
Glucose-Capillary: 99 mg/dL (ref 70–99)
Glucose-Capillary: 99 mg/dL (ref 70–99)

## 2018-05-13 LAB — COMPREHENSIVE METABOLIC PANEL
ALT: 11 U/L (ref 0–44)
AST: 19 U/L (ref 15–41)
Albumin: 2.4 g/dL — ABNORMAL LOW (ref 3.5–5.0)
Alkaline Phosphatase: 37 U/L — ABNORMAL LOW (ref 38–126)
Anion gap: 10 (ref 5–15)
BUN: 62 mg/dL — ABNORMAL HIGH (ref 6–20)
CO2: 21 mmol/L — ABNORMAL LOW (ref 22–32)
Calcium: 7.5 mg/dL — ABNORMAL LOW (ref 8.9–10.3)
Chloride: 112 mmol/L — ABNORMAL HIGH (ref 98–111)
Creatinine, Ser: 5.58 mg/dL — ABNORMAL HIGH (ref 0.44–1.00)
GFR calc Af Amer: 9 mL/min — ABNORMAL LOW (ref >60)
GFR calc non Af Amer: 8 mL/min — ABNORMAL LOW (ref >60)
Glucose, Bld: 106 mg/dL — ABNORMAL HIGH (ref 70–99)
Potassium: 3.4 mmol/L — ABNORMAL LOW (ref 3.5–5.1)
Sodium: 143 mmol/L (ref 135–145)
Total Bilirubin: 0.5 mg/dL (ref 0.3–1.2)
Total Protein: 4.7 g/dL — ABNORMAL LOW (ref 6.5–8.1)

## 2018-05-13 LAB — PROCALCITONIN: Procalcitonin: 0.34 ng/mL

## 2018-05-13 LAB — MAGNESIUM: Magnesium: 1.8 mg/dL (ref 1.7–2.4)

## 2018-05-13 MED ORDER — METRONIDAZOLE IN NACL 5-0.79 MG/ML-% IV SOLN
500.0000 mg | Freq: Three times a day (TID) | INTRAVENOUS | Status: AC
  Administered 2018-05-13 – 2018-05-18 (×14): 500 mg via INTRAVENOUS
  Filled 2018-05-13 (×16): qty 100

## 2018-05-13 MED ORDER — DEXMEDETOMIDINE HCL IN NACL 400 MCG/100ML IV SOLN
0.4000 ug/kg/h | INTRAVENOUS | Status: DC
  Administered 2018-05-13 – 2018-05-14 (×4): 1.2 ug/kg/h via INTRAVENOUS
  Administered 2018-05-14: 0.6 ug/kg/h via INTRAVENOUS
  Administered 2018-05-14: 1 ug/kg/h via INTRAVENOUS
  Administered 2018-05-14 – 2018-05-15 (×3): 1.2 ug/kg/h via INTRAVENOUS
  Filled 2018-05-13 (×11): qty 100

## 2018-05-13 MED ORDER — POTASSIUM CHLORIDE CRYS ER 20 MEQ PO TBCR: 20.0000 meq | EXTENDED_RELEASE_TABLET | Freq: Once | ORAL | Status: DC

## 2018-05-13 MED ORDER — POTASSIUM CHLORIDE 10 MEQ/100ML IV SOLN
10.0000 meq | INTRAVENOUS | Status: DC
  Administered 2018-05-13: 10 meq via INTRAVENOUS
  Filled 2018-05-13 (×2): qty 100

## 2018-05-13 MED ORDER — SODIUM CHLORIDE 0.9 % IV SOLN
2.0000 g | INTRAVENOUS | Status: DC
  Administered 2018-05-13: 2 g via INTRAVENOUS
  Filled 2018-05-13: qty 2

## 2018-05-13 MED ORDER — SODIUM CHLORIDE 0.9 % IV SOLN
1.0000 g | INTRAVENOUS | Status: AC
  Administered 2018-05-14 – 2018-05-20 (×7): 1 g via INTRAVENOUS
  Filled 2018-05-13 (×8): qty 1

## 2018-05-13 MED ORDER — DOCUSATE SODIUM 50 MG/5ML PO LIQD
100.0000 mg | Freq: Two times a day (BID) | ORAL | Status: DC
  Administered 2018-05-13 – 2018-05-24 (×18): 100 mg
  Filled 2018-05-13 (×25): qty 10

## 2018-05-13 MED ORDER — VASOPRESSIN 20 UNIT/ML IV SOLN
0.0300 [IU]/min | INTRAVENOUS | Status: DC
  Administered 2018-05-13: 0.03 [IU]/min via INTRAVENOUS
  Filled 2018-05-13: qty 2

## 2018-05-13 NOTE — Progress Notes (Addendum)
MEDICATION RELATED CONSULT NOTE - INITIAL   Pharmacy Consult for Electrolytes   Allergies  Allergen Reactions  . Hctz [Hydrochlorothiazide]     pancreatitis  . Amlodipine Swelling    Knees down to ankles  . Amlodipine Besylate Swelling  . Sulfa Antibiotics Rash  . Dairy Aid [Lactase]     Runny nose    Patient Measurements: Height: 5' 3.5" (161.3 cm) Weight: 182 lb 15.7 oz (83 kg) IBW/kg (Calculated) : 53.55 Adjusted Body Weight:   Vital Signs: Temp: 92.7 F (33.7 C) (09/01 0200) Temp Source: Core (09/01 0200) BP: 140/66 (09/01 0245) Pulse Rate: 61 (09/01 0245) Intake/Output from previous day: 08/31 0701 - 09/01 0700 In: 1964.5 [I.V.:1546.8; NG/GT:60; IV Piggyback:357.8] Out: 795 [Urine:770; Emesis/NG output:25] Intake/Output from this shift: Total I/O In: 777.3 [I.V.:627.3; IV Piggyback:150] Out: 221 [Urine:196; Emesis/NG output:25]  Labs: Recent Labs    05/11/18 1922 05/11/18 2105  05/12/18 0225 05/12/18 0356  05/12/18 1806 05/12/18 2204 05/13/18 0211  WBC 12.3*  --   --  13.1*  --   --   --   --   --   HGB 9.0*  --   --  8.7*  --   --   --   --   --   HCT 27.5*  --   --  26.4*  --   --   --   --   --   PLT 207  --   --  143*  --   --   --   --   --   APTT  --  27  --   --  29  --   --   --   --   CREATININE 5.73*  --    < > 5.76*  --    < > 5.70* 5.69* 5.59*  MG 1.7  --   --  1.9  --   --   --   --   --   PHOS 5.9*  --   --   --   --   --   --   --   --   ALBUMIN 2.8*  --   --   --   --   --   --   --   --   PROT 5.8*  --   --   --   --   --   --   --   --   AST 39  --   --   --   --   --   --   --   --   ALT 32  --   --   --   --   --   --   --   --   ALKPHOS 42  --   --   --   --   --   --   --   --   BILITOT 0.5  --   --   --   --   --   --   --   --    < > = values in this interval not displayed.   Estimated Creatinine Clearance: 11 mL/min (A) (by C-G formula based on SCr of 5.59 mg/dL (H)).   Microbiology: Recent Results (from the past 720  hour(s))  MRSA PCR Screening     Status: None   Collection Time: 05/11/18  7:56 PM  Result Value Ref Range Status   MRSA by PCR NEGATIVE NEGATIVE Final  Comment:        The GeneXpert MRSA Assay (FDA approved for NASAL specimens only), is one component of a comprehensive MRSA colonization surveillance program. It is not intended to diagnose MRSA infection nor to guide or monitor treatment for MRSA infections. Performed at [facility], [address], [place], [place] [address]     Medical History: Past Medical History:  Diagnosis Date  . Anemia 04/2018   low iron. to be started on supplements  . CKD (chronic kidney disease)    Stage IV  . Diabetes mellitus without complication ([place])    type II  . ESRD (end stage renal disease) ([place])   . Heart murmur    followed as a child only  . Hyperlipidemia associated with type 2 diabetes mellitus ([place])   . Hypertension   . Pancreatitis   . Peripheral vascular disease ([facility])     Medications:  Medications Prior to Admission  Medication Sig Dispense Refill Last Dose  . atorvastatin (LIPITOR) 40 MG tablet Take 40 mg by mouth daily at 6 PM.    05/10/2018 at Unknown time  . bumetanide (BUMEX) 1 MG tablet Take 2 mg by mouth 2 (two) times daily.    05/10/2018 at Unknown time  . carvedilol (COREG CR) 40 MG 24 hr capsule Take 40 mg by mouth daily. Takes in the morning when she gets home from work   05/11/2018 at 0730  . diltiazem (CARDIZEM LA) 120 MG 24 hr tablet Take 1 tablet (120 mg total) by mouth daily. (Patient taking differently: Take 120 mg by mouth daily. Takes in the morning when she gets home from work) 30 tablet 1 05/11/2018 at Unknown time  . fluticasone (FLONASE) 50 MCG/ACT nasal spray 2 sprays by Each Nare route daily.   Past Month at Unknown time  . hydrALAZINE (APRESOLINE) 50 MG tablet Take 50 mg by mouth every 8 (eight) hours.    05/11/2018 at Unknown time  . Insulin Glargine 300 UNIT/ML SOPN Inject 40 Units into the  skin daily. In the morning.  (toujeo)   05/10/2018 at Unknown time  . amLODipine (NORVASC) 5 MG tablet Take 1 tablet by mouth once daily   Not Taking at Unknown time  . aspirin EC 81 MG tablet Take 81 mg by mouth daily.   Not Taking at Unknown time  . insulin aspart (NOVOLOG) 100 UNIT/ML FlexPen Inject 10-12 Units into the skin 3 (three) times daily with meals.    Not Taking at Unknown time  . losartan (COZAAR) 100 MG tablet Take 1 tablet by mouth once daily   Not Taking at Unknown time  . metoprolol (LOPRESSOR) 50 MG tablet Take 50 mg by mouth daily.    Taking  . ondansetron (ZOFRAN) 4 MG tablet Take 1 tablet (4 mg total) by mouth every 6 (six) hours as needed for nausea. (Patient not taking: Reported on 05/04/2018) 20 tablet 0 Not Taking at Unknown time  . oxyCODONE (OXY IR/ROXICODONE) 5 MG immediate release tablet Take 1 tablet (5 mg total) by mouth every 4 (four) hours as needed for moderate pain. (Patient not taking: Reported on 05/04/2018) 30 tablet 0 Not Taking at Unknown time  . spironolactone (ALDACTONE) 50 MG tablet Take 50 mg by mouth daily.   Not Taking at Unknown time  . Vitamin D, Ergocalciferol, (DRISDOL) 50000 units CAPS capsule TAKE ONE CAPSULE BY MOUTH ONE TIME PER WEEK  0 Not Taking at Unknown time    Assessment: Pharmacy consulted for electrolyte  monitoring and replacement in 62 yo female with PMH of ESRD. Patient is S/P Cardiac arrest in OR. Induced hypothermia 8/31.   8/31:  K @ 18:00 = 3.3  Will order KCl 10 mEq IV X 4 and recheck K on 8/31 @ 22:00.   8/31:  K @ 22:00 = 3.5  No additional K supplementation needed.    Goal of Therapy:  Electrolytes WNL  Plan:  9/1 @ 0200 =K: 3.2  Rewarming process started @ 2300 on 8/31, however K+ dropped from 3.5 to 3.2. Would Expect K+ to increase.  Will order KCl 88mEq IV x 1. Recheck BMP @ 0600.  Continue to conservatively replace K+.    Pernell Dupre, PharmD, BCPS Clinical Pharmacist 05/13/2018 2:52 AM

## 2018-05-13 NOTE — Progress Notes (Addendum)
Patient received on charted drips, paralyzed and 35.7 degrees Celsius. No reaction to any stimuli. No withdrawing to pain or pupil reactions noted. DR.Samaan in with instructions to keep MAP goal at 80-100. Orders placed.

## 2018-05-13 NOTE — Progress Notes (Addendum)
1030 Discussed fluctuation of blood pressure as patient is warmed. Order for Vasopressin was given. Late note 1000 Nimbex stopped per protocol. Late note 0900 [facility] called to check opn patient status

## 2018-05-13 NOTE — Progress Notes (Signed)
MEDICATION RELATED CONSULT NOTE - INITIAL   Pharmacy Consult for Electrolytes    Labs: Recent Labs    05/11/18 1922 05/11/18 2105  05/12/18 0225 05/12/18 0356  05/13/18 0450  05/13/18 1518 05/13/18 1849 05/13/18 2206  WBC 12.3*  --   --  13.1*  --   --  5.7  --   --   --   --   HGB 9.0*  --   --  8.7*  --   --  7.8*  --   --   --   --   HCT 27.5*  --   --  26.4*  --   --  23.1*  --   --   --   --   PLT 207  --   --  143*  --   --  174  --   --   --   --   APTT  --  27  --   --  29  --   --   --   --   --   --   CREATININE 5.73*  --    < > 5.76*  --    < > 5.58*   < > 5.69* 5.63* 5.45*  MG 1.7  --   --  1.9  --   --  1.8  --   --   --   --   PHOS 5.9*  --   --   --   --   --  4.2  --   --   --   --   ALBUMIN 2.8*  --   --   --   --   --  2.4*  --   --   --   --   PROT 5.8*  --   --   --   --   --  4.7*  --   --   --   --   AST 39  --   --   --   --   --  19  --   --   --   --   ALT 32  --   --   --   --   --  11  --   --   --   --   ALKPHOS 42  --   --   --   --   --  37*  --   --   --   --   BILITOT 0.5  --   --   --   --   --  0.5  --   --   --   --    < > = values in this interval not displayed.   Potassium (mmol/L)  Date Value  05/13/2018 4.3  06/14/2014 3.6   Magnesium (mg/dL)  Date Value  05/13/2018 1.8  12/10/2013 1.8   Calcium (mg/dL)  Date Value  05/13/2018 7.2 (L)   Calcium, Total (mg/dL)  Date Value  06/14/2014 8.4 (L)   Albumin (g/dL)  Date Value  05/13/2018 2.4 (L)  06/12/2014 2.9 (L)   Phosphorus (mg/dL)  Date Value  05/13/2018 4.2  ]  Estimated Creatinine Clearance: 11.6 mL/min (A) (by C-G formula based on SCr of 5.45 mg/dL (H)).   Assessment: Pharmacy consulted for electrolyte monitoring and replacement in 62 yo female with PMH of ESRD. Patient is S/P Cardiac arrest in OR. Induced hypothermia 8/31.   Rewarming process started @ 2300 on 8/31.  Expect K+ to increase. Labs ordered every 4 hours while rewarming.   Goal of Therapy:   Electrolytes WNL  Plan:  9/1 @ 2206.  No additional replacement needed at this time. Labs ordered every 4 hours. Pharmacy will continue to monitor and replace electrolytes as needed.   Pernell Dupre, PharmD, BCPS Clinical Pharmacist 05/13/2018 10:58 PM

## 2018-05-13 NOTE — Progress Notes (Signed)
1330 coughing against ventilator. Eyes open but does not focus or turn head towards sound.Raised arms bilaterally from elbows.Does not withdraw from pain.

## 2018-05-13 NOTE — Progress Notes (Signed)
Rewarming complete. Patient off of Levophed and sedation changed to Precedex and Fentanyl. Opens eyes with stimulation. Does not focus or turn head to sound.Also raises arms bilaterally to stimulation.Does not follow commands. Wiggles toes bilaterally to pain. No purposeful movement to stimulation or pain. Urine output has increased today. O/G to low intermittant wall suction. Faint bowel sounds heard briefly. Family in and supportive.

## 2018-05-13 NOTE — Progress Notes (Signed)
Initial Nutrition Assessment  DOCUMENTATION CODES:   Obesity unspecified  INTERVENTION:    If tube feeds initiated recommend Vital HP @ trickle rate of 52ml/hr + Prostat liquid protein 30 ml TID via tube  Free water flushes 43ml q 4 hrs  Dextrose 10% @ 3ml/hr- provides 245kcal/day   Propofol: 24.1 ml/hr- provides 636kcal/day  Regimen provides 1421kcal/day, 66g/day protein, 322ml/day free water  Liquid MVI daily via tube   NUTRITION DIAGNOSIS:   Inadequate oral intake related to acute illness(pt sedated and ventilated) as evidenced by NPO status.  GOAL:   Provide needs based on ASPEN/SCCM guidelines  MONITOR:   Vent status, Labs, Weight trends, Skin, I & O's  REASON FOR ASSESSMENT:   Ventilator    ASSESSMENT:   62 y.o. black female with dialysis mellitus type II insulin dependent, hypertension, hyperlipidemia, peripheral vascular disease, chronic pancreatitis, anemia of chronic kidney disease admitted on 05/11/2018 for AVF placement w/ complications requiring thrombectomy and with post op cardiac arrest    Pt sedated and ventilated. OGT in place. No plans for tube feeds today. Hypothermic protocol; pt warming today. Per chart, pt appears fairly weight stable pta. If tube feeds initiated recommend trickle rate and consult RD as pt on high rate of 10% dextrose and propofol.   Per nephrology note, pt not on binders or vitamin D at baseline   Medications reviewed and include: aspirin, plavix, colace, NaCl @10ml /hr, cefepime, Dextrose 10% @ 61ml/hr, pepcid, fentanyl, metronidazole, levophed, propofol, vasopressin  Labs reviewed: iPTH 365 on 04/04/18  Patient is currently intubated on ventilator support MV: 5.8 L/min Temp (24hrs), Avg:92.8 F (33.8 C), Min:91.2 F (32.9 C), Max:97.5 F (36.4 C)  Propofol: 24.1 ml/hr- provides 636kcal/day   MAP- >21mmHg  UOP- 963ml x 24 hrs  NUTRITION - FOCUSED PHYSICAL EXAM:    Most Recent Value  Orbital Region  No depletion   Upper Arm Region  No depletion  Thoracic and Lumbar Region  No depletion  Buccal Region  No depletion  Temple Region  No depletion  Clavicle Bone Region  No depletion  Clavicle and Acromion Bone Region  No depletion  Scapular Bone Region  No depletion  Dorsal Hand  No depletion  Patellar Region  No depletion  Anterior Thigh Region  No depletion  Posterior Calf Region  No depletion  Edema (RD Assessment)  Mild  Hair  Reviewed  Eyes  Reviewed  Mouth  Reviewed  Skin  Reviewed  Nails  Reviewed     Diet Order:   Diet Order            Diet NPO time specified  Diet effective now             EDUCATION NEEDS:   Not appropriate for education at this time  Skin:  Skin Assessment: Reviewed RN Assessment(incision L arm )  Last BM:  pta  Height:   Ht Readings from Last 1 Encounters:  05/11/18 5' 3.5" (1.613 m)    Weight:   Wt Readings from Last 1 Encounters:  05/13/18 80.4 kg    Ideal Body Weight:  53.4 kg  BMI:  Body mass index is 30.91 kg/m.  Estimated Nutritional Needs:   Kcal:  885-1126kcal/day   Protein:  >107g/day   Fluid:  >1.6L/day   Koleen Distance MS, RD, LDN Pager #- [phone] Office#- [phone] After Hours Pager: [phone]

## 2018-05-13 NOTE — Progress Notes (Signed)
MEDICATION RELATED CONSULT NOTE - INITIAL   Pharmacy Consult for Electrolytes    Labs: Recent Labs    05/11/18 1922 05/11/18 2105  05/12/18 0225 05/12/18 0356  05/13/18 0450 05/13/18 0706 05/13/18 1058 05/13/18 1518  WBC 12.3*  --   --  13.1*  --   --  5.7  --   --   --   HGB 9.0*  --   --  8.7*  --   --  7.8*  --   --   --   HCT 27.5*  --   --  26.4*  --   --  23.1*  --   --   --   PLT 207  --   --  143*  --   --  174  --   --   --   APTT  --  27  --   --  29  --   --   --   --   --   CREATININE 5.73*  --    < > 5.76*  --    < > 5.58* 5.70* 5.70* 5.69*  MG 1.7  --   --  1.9  --   --  1.8  --   --   --   PHOS 5.9*  --   --   --   --   --  4.2  --   --   --   ALBUMIN 2.8*  --   --   --   --   --  2.4*  --   --   --   PROT 5.8*  --   --   --   --   --  4.7*  --   --   --   AST 39  --   --   --   --   --  19  --   --   --   ALT 32  --   --   --   --   --  11  --   --   --   ALKPHOS 42  --   --   --   --   --  37*  --   --   --   BILITOT 0.5  --   --   --   --   --  0.5  --   --   --    < > = values in this interval not displayed.   Potassium (mmol/L)  Date Value  05/13/2018 4.5  06/14/2014 3.6   Magnesium (mg/dL)  Date Value  05/13/2018 1.8  12/10/2013 1.8   Calcium (mg/dL)  Date Value  05/13/2018 7.6 (L)   Calcium, Total (mg/dL)  Date Value  06/14/2014 8.4 (L)   Albumin (g/dL)  Date Value  05/13/2018 2.4 (L)  06/12/2014 2.9 (L)   Phosphorus (mg/dL)  Date Value  05/13/2018 4.2  ]  Estimated Creatinine Clearance: 10.7 mL/min (A) (by C-G formula based on SCr of 5.69 mg/dL (H)).   Assessment: Pharmacy consulted for electrolyte monitoring and replacement in 62 yo female with PMH of ESRD. Patient is S/P Cardiac arrest in OR. Induced hypothermia 8/31.   Goal of Therapy:  Electrolytes WNL  Plan:  9/1 @ 1058 K: 4.1  Rewarming process started @ 2300 on 8/31. Expect K+ to increase.  No replacement needed at this time.  Continue to conservatively replace K+  as needed.   Labs ordered every 4 hours while rewarming.  9/1 @ 1500:  K = 4.5   No additional K needed at this time.  Will recheck electrolytes around 1900.   Orene Desanctis, PharmD Clinical Pharmacist 05/13/2018 4:18 PM

## 2018-05-13 NOTE — Progress Notes (Signed)
MEDICATION RELATED CONSULT NOTE - INITIAL   Pharmacy Consult for Electrolytes    Labs: Recent Labs    05/11/18 1922 05/11/18 2105  05/12/18 0225 05/12/18 0356  05/13/18 0450  05/13/18 1058 05/13/18 1518 05/13/18 1849  WBC 12.3*  --   --  13.1*  --   --  5.7  --   --   --   --   HGB 9.0*  --   --  8.7*  --   --  7.8*  --   --   --   --   HCT 27.5*  --   --  26.4*  --   --  23.1*  --   --   --   --   PLT 207  --   --  143*  --   --  174  --   --   --   --   APTT  --  27  --   --  29  --   --   --   --   --   --   CREATININE 5.73*  --    < > 5.76*  --    < > 5.58*   < > 5.70* 5.69* 5.63*  MG 1.7  --   --  1.9  --   --  1.8  --   --   --   --   PHOS 5.9*  --   --   --   --   --  4.2  --   --   --   --   ALBUMIN 2.8*  --   --   --   --   --  2.4*  --   --   --   --   PROT 5.8*  --   --   --   --   --  4.7*  --   --   --   --   AST 39  --   --   --   --   --  19  --   --   --   --   ALT 32  --   --   --   --   --  11  --   --   --   --   ALKPHOS 42  --   --   --   --   --  37*  --   --   --   --   BILITOT 0.5  --   --   --   --   --  0.5  --   --   --   --    < > = values in this interval not displayed.   Potassium (mmol/L)  Date Value  05/13/2018 4.6  06/14/2014 3.6   Magnesium (mg/dL)  Date Value  05/13/2018 1.8  12/10/2013 1.8   Calcium (mg/dL)  Date Value  05/13/2018 7.6 (L)   Calcium, Total (mg/dL)  Date Value  06/14/2014 8.4 (L)   Albumin (g/dL)  Date Value  05/13/2018 2.4 (L)  06/12/2014 2.9 (L)   Phosphorus (mg/dL)  Date Value  05/13/2018 4.2  ]  Estimated Creatinine Clearance: 11.3 mL/min (A) (by C-G formula based on SCr of 5.63 mg/dL (H)).   Assessment: Pharmacy consulted for electrolyte monitoring and replacement in 62 yo female with PMH of ESRD. Patient is S/P Cardiac arrest in OR. Induced hypothermia 8/31.   Goal of Therapy:  Electrolytes WNL  Plan:  9/1 @ 1058 K: 4.1  Rewarming process started @ 2300 on 8/31. Expect K+ to increase.  No  replacement needed at this time.  Continue to conservatively replace K+ as needed.   Labs ordered every 4 hours while rewarming.   9/1 @ 1500:  K = 4.5   No additional K needed at this time.  Will recheck electrolytes around 1900.   9/1 @ 1900:  K = 4.6  No additional K needed at this time.  Will recheck electrolytes around 2300.   Orene Desanctis, PharmD Clinical Pharmacist 05/13/2018 7:43 PM

## 2018-05-13 NOTE — Progress Notes (Signed)
Name: Tracy Jennings MRN: [id number] DOB: 1956-07-09     CONSULTATION DATE: 05/11/2018  Subjective & Objective: On induced hypothermia and pressers. PAST MEDICAL HISTORY :   has a past medical history of Anemia (04/2018), CKD (chronic kidney disease), Diabetes mellitus without complication ([place]), ESRD (end stage renal disease) ([place]), Heart murmur, Hyperlipidemia associated with type 2 diabetes mellitus ([place]), Hypertension, Pancreatitis, and Peripheral vascular disease ([place]).  has a past surgical history that includes Cataract extraction; Amputation toe (Left, 2013); Colonoscopy; Colonoscopy with propofol (N/A, 01/10/2018); Eye surgery (Bilateral, 2018); Cholecystectomy (2014); Tubal ligation (1984); AV fistula placement (Left, 05/11/2018); and Thrombectomy w/ embolectomy (05/11/2018). Prior to Admission medications   Medication Sig Start Date End Date Taking? Authorizing Provider  atorvastatin (LIPITOR) 40 MG tablet Take 40 mg by mouth daily at 6 PM.  10/05/15 05/11/18 Yes [provider]  bumetanide (BUMEX) 1 MG tablet Take 2 mg by mouth 2 (two) times daily.  02/28/18 02/28/19 Yes [provider]  carvedilol (COREG CR) 40 MG 24 hr capsule Take 40 mg by mouth daily. Takes in the morning when she gets home from work 11/06/17  Yes [provider]  diltiazem (CARDIZEM LA) 120 MG 24 hr tablet Take 1 tablet (120 mg total) by mouth daily. Patient taking differently: Take 120 mg by mouth daily. Takes in the morning when she gets home from work 08/08/17  Yes Cuthriell, Charline Bills, PA-C  fluticasone (FLONASE) 50 MCG/ACT nasal spray 2 sprays by Each Nare route daily. 01/15/18 01/15/19 Yes [provider]  hydrALAZINE (APRESOLINE) 50 MG tablet Take 50 mg by mouth every 8 (eight) hours.  10/24/17  Yes [provider]  Insulin Glargine 300 UNIT/ML SOPN Inject 40 Units into the skin daily. In the morning.  (toujeo) 11/30/15  Yes [provider]  amLODipine (NORVASC)  5 MG tablet Take 1 tablet by mouth once daily 01/18/16   [provider]  aspirin EC 81 MG tablet Take 81 mg by mouth daily. 09/27/17   [provider]  HYDROcodone-acetaminophen (NORCO) 5-325 MG tablet Take 1-2 tablets by mouth every 6 (six) hours as needed for moderate pain or severe pain. 05/11/18   Schnier, Dolores Lory, MD  insulin aspart (NOVOLOG) 100 UNIT/ML FlexPen Inject 10-12 Units into the skin 3 (three) times daily with meals.  12/28/15   [provider]  losartan (COZAAR) 100 MG tablet Take 1 tablet by mouth once daily 01/18/16   [provider]  metoprolol (LOPRESSOR) 50 MG tablet Take 50 mg by mouth daily.  02/20/15   [provider]  ondansetron (ZOFRAN) 4 MG tablet Take 1 tablet (4 mg total) by mouth every 6 (six) hours as needed for nausea. Patient not taking: Reported on 05/04/2018 11/03/17   Epifanio Lesches, MD  oxyCODONE (OXY IR/ROXICODONE) 5 MG immediate release tablet Take 1 tablet (5 mg total) by mouth every 4 (four) hours as needed for moderate pain. Patient not taking: Reported on 05/04/2018 11/03/17   Epifanio Lesches, MD  spironolactone (ALDACTONE) 50 MG tablet Take 50 mg by mouth daily. 09/27/17   [provider]  Vitamin D, Ergocalciferol, (DRISDOL) 50000 units CAPS capsule TAKE ONE CAPSULE BY MOUTH ONE TIME PER WEEK 01/06/18   [provider]   Allergies  Allergen Reactions  . Hctz [Hydrochlorothiazide]     pancreatitis  . Amlodipine Swelling    Knees down to ankles  . Amlodipine Besylate Swelling  . Sulfa Antibiotics Rash  . Dairy Aid [Lactase]  Runny nose    FAMILY HISTORY:  family history includes Cancer in her father; Diabetes in her maternal grandfather, maternal grandmother, paternal grandfather, paternal grandmother, and sister; Hypertension in her mother; Stroke in her mother. SOCIAL HISTORY:  reports that she has never smoked. She has never used smokeless tobacco. She reports that she does  not drink alcohol or use drugs.  REVIEW OF SYSTEMS:   Unable to obtain due to critical illness   VITAL SIGNS: Temp:  [91.2 F (32.9 C)-98.4 F (36.9 C)] 98.4 F (36.9 C) (09/01 1300) Pulse Rate:  [55-84] 84 (09/01 1345) Resp:  [0-20] 16 (09/01 1345) BP: (93-210)/(50-85) 161/77 (09/01 1345) SpO2:  [100 %] 100 % (09/01 1345) FiO2 (%):  [30 %] 30 % (09/01 1328) Weight:  [80.4 kg-87 kg] 80.4 kg (09/01 0800)  Physical Examination: On sedatives and paralytics. Induced hypothermia protocol On vent, no distress, bilateral equal air entry and no adventitious sounds S1 & S2 are audible with no murmur Benign abdominal exam with feeble peristalsis Dress left arm newly created AV fistula, no thrill palpable No edema   ASSESSMENT / PLAN:  Acute respiratory failure. On hypothermia protocol. -Monitor ABG, optimize ventilator settings.  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 No acute intracranial abnormality on CT head. On induced hypothermia. -Monitor neuro status -EEG and neurology evaluation after rewarming.  Cardiac arrest in OR after refashioning of left arm AV fistula created for dialysis. ECHO LVEF 20-25%, diastolic dysfunction grade I. Elevated Troponin, no ischemic changes on EKG. ASA + Plavix -Induced hypothermia and no further recommendation by cardiology. -Hemodynamic monitoring.  Atelectasis and pneumonia with possible aspiration s/p cardiac arrest -Empiric Cefep + Flagyl. Monitor CXR + CBC + FIO2.  CHF with pulmonary congestion on chest x-ray post intubation -Diuresis/RRT as per renal to improve lung compliance.  Hypotension with meds / sepsis -Monitor hemodynamics, procal., Lactic acid and optimize meds.  HTN -Optimize antihypertensives and monitor hemodynamics.  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 S/p placement of Lt. Arm AVF  -Management  as per renal. Not on RRT.  Left arm AV fistula with questionable malfunctioning -Management as per vascular  Diabetes mellitus -Glycemic control  Dyslipidemia -Statin  Anemia -Hemoglobin more than 7 g/dL  Thrombocytopenia -Monitor Plat  Full code  DVT and GI prophylaxis. Continue with supportive care  Critical care time 40 minutes

## 2018-05-13 NOTE — Progress Notes (Signed)
[facility]  ROUNDING NOTE   Subjective:   Hypothermic protocol.  Husband at bedside.   Norepinephrine gtt  UOP 898  Creatinine 5.7 (5.58)  hgb 7.8  K 3.6  Objective:  Vital signs in last 24 hours:  Temp:  [90.1 F (32.3 C)-96.4 F (35.8 C)] 96.4 F (35.8 C) (09/01 0900) Pulse Rate:  [55-76] 76 (09/01 0930) Resp:  [0-20] 10 (09/01 0930) BP: (93-201)/(51-78) 157/66 (09/01 0930) SpO2:  [100 %] 100 % (09/01 0930) FiO2 (%):  [30 %] 30 % (09/01 0800) Weight:  [80.4 kg-87 kg] 80.4 kg (09/01 0800)  Weight change: 6.628 kg Filed Weights   05/12/18 0500 05/13/18 0500 05/13/18 0800  Weight: 83 kg 87 kg 80.4 kg    Intake/Output: I/O last 3 completed shifts: In: 3214.1 [I.V.:2596.4; NG/GT:60; IV Piggyback:557.7] Out: 1841 [Urine:1816; Emesis/NG output:25]   Intake/Output this shift:  Total I/O In: 93.9 [I.V.:93.9] Out: 15 [Urine:15]  Physical Exam: General: Critically ill  Head: Normocephalic, atraumatic. ETT OGT  Eyes: Anicteric   Neck: Supple, trachea midline  Lungs:  Clear to auscultation, PRVC FiO 30%  Heart: Regular rate and rhythm  Abdomen:  Soft, nontender,   Extremities: Trace - 1+  peripheral edema.  Neurologic: Intubated and sedated  Skin: No lesions  Access: Left AVF - no thrill or bruit appreciated.     Basic Metabolic Panel: Recent Labs  Lab 05/11/18 1922  05/12/18 0225  05/12/18 1806 05/12/18 2204 05/13/18 0211 05/13/18 0450 05/13/18 0706  NA 139   < > 141   < > 145 144 143 143 143  K 5.3*   < > 4.1   < > 3.3* 3.5 3.2* 3.4* 3.6  CL 111   < > 109   < > 113* 113* 112* 112* 111  CO2 21*   < > 22   < > 22 20* 21* 21* 22  GLUCOSE 192*   < > 235*   < > 119* 116* 106* 106* 100*  BUN 59*   < > 61*   < > 62* 64* 64* 62* 61*  CREATININE 5.73*   < > 5.76*   < > 5.70* 5.69* 5.59* 5.58* 5.70*  CALCIUM 8.0*   < > 7.9*   < > 7.8* 7.7* 7.6* 7.5* 7.5*  MG 1.7  --  1.9  --   --   --   --  1.8  --   PHOS 5.9*  --   --   --   --   --   --   4.2  --    < > = values in this interval not displayed.    Liver Function Tests: Recent Labs  Lab 05/11/18 1922 05/13/18 0450  AST 39 19  ALT 32 11  ALKPHOS 42 37*  BILITOT 0.5 0.5  PROT 5.8* 4.7*  ALBUMIN 2.8* 2.4*   No results for input(s): LIPASE, AMYLASE in the last 168 hours. No results for input(s): AMMONIA in the last 168 hours.  CBC: Recent Labs  Lab 05/11/18 1922 05/12/18 0225 05/13/18 0450  WBC 12.3* 13.1* 5.7  NEUTROABS 11.2*  --  3.7  HGB 9.0* 8.7* 7.8*  HCT 27.5* 26.4* 23.1*  MCV 90.5 89.7 87.8  PLT 207 143* 174    Cardiac Enzymes: Recent Labs  Lab 05/11/18 1922 05/11/18 2105 05/12/18 0225 05/12/18 0818 05/12/18 1413  TROPONINI 0.03* 0.05* 0.08* 0.07* 0.06*    BNP: Invalid input(s): POCBNP  CBG: Recent Labs  Lab 05/13/18 0406 05/13/18 0459  05/13/18 0611 05/13/18 0704 05/13/18 0857  GLUCAP 99 102* 90 92 106*    Microbiology: Results for orders placed or performed during the hospital encounter of 05/11/18  MRSA PCR Screening     Status: None   Collection Time: 05/11/18  7:56 PM  Result Value Ref Range Status   MRSA by PCR NEGATIVE NEGATIVE Final    Comment:        The GeneXpert MRSA Assay (FDA approved for NASAL specimens only), is one component of a comprehensive MRSA colonization surveillance program. It is not intended to diagnose MRSA infection nor to guide or monitor treatment for MRSA infections. Performed at [facility], [address] [place], [place] [address]     Coagulation Studies: Recent Labs    05/11/18 10-Nov-2103 05/12/18 0356  LABPROT 13.8 13.5  INR 1.07 1.04    Urinalysis: No results for input(s): COLORURINE, LABSPEC, PHURINE, GLUCOSEU, HGBUR, BILIRUBINUR, KETONESUR, PROTEINUR, UROBILINOGEN, NITRITE, LEUKOCYTESUR in the last 72 hours.  Invalid input(s): APPERANCEUR    Imaging: Dg Abd 1 View  Result Date: 05/11/2018 CLINICAL DATA:  Orogastric tube placement. EXAM: ABDOMEN - 1 VIEW  COMPARISON:  CT abdomen and pelvis August 07, 2016 FINDINGS: Nasogastric tube tip projects in mid stomach, side port past GE junction region. Included bowel gas pattern is nondilated and nonobstructive. Surgical clips in the included right abdomen compatible with cholecystectomy. Included soft tissue planes and osseous structures are non suspicious. IMPRESSION: Nasogastric tube tip projects mid stomach. Electronically Signed   By: Elon Alas M.D.   On: 05/11/2018 22:37   Ct Head Wo Contrast  Result Date: 05/11/2018 CLINICAL DATA:  Cardiac arrest. EXAM: CT HEAD WITHOUT CONTRAST TECHNIQUE: Contiguous axial images were obtained from the base of the skull through the vertex without intravenous contrast. COMPARISON:  None. FINDINGS: Brain: No evidence of acute infarction, hemorrhage, hydrocephalus, extra-axial collection or mass lesion/mass effect. Mild generalized cerebral atrophy. Vascular: No hyperdense vessel or unexpected calcification. Skull: Normal. Negative for fracture or focal lesion. Sinuses/Orbits: Scattered ethmoid air cell mucosal thickening with small air-fluid levels. Small air-fluid level in the right sphenoid sinus. Mastoid air cells are clear. Other: None. IMPRESSION: 1.  No acute intracranial abnormality. Electronically Signed   By: Titus Dubin M.D.   On: 05/11/2018 20:02   Dg Chest Port 1 View  Result Date: 05/12/2018 CLINICAL DATA:  Intubated patient.  Hospitalized for cardiac arrest. EXAM: PORTABLE CHEST 1 VIEW COMPARISON:  05/11/2018 and older exams. FINDINGS: Endotracheal tube projects 3.2 cm above the Carina. Nasal/orogastric tube passes below the diaphragm well into the stomach. There is patchy airspace opacity in the right upper lobe. This is similar to the previous day's study allowing for differences in technique. Remainder of the lungs is clear with no convincing pulmonary edema. No visible pleural effusion. No pneumothorax. Heart is normal in size and configuration. No  mediastinal or hilar masses. IMPRESSION: 1. Endotracheal tube and nasal/orogastric tube are well positioned. 2. Patchy airspace opacity in the right upper lobe. Consider pneumonia or aspiration pneumonitis. There is no evidence of pulmonary edema. 3. No pneumothorax. Electronically Signed   By: Lajean Manes M.D.   On: 05/12/2018 11:50   Dg Chest Port 1 View  Result Date: 05/11/2018 CLINICAL DATA:  Central line placement. EXAM: PORTABLE CHEST 1 VIEW COMPARISON:  Chest radiograph May 11, 2018 at 1808 hours FINDINGS: No central line identified. Endotracheal tube tip projects 4.6 cm above the carina. Nasogastric tube past proximal stomach, out of field of view. Stable cardiomegaly  with interstitial prominence and patchy RIGHT upper lobe airspace opacities. No pleural effusion. No pneumothorax. Soft tissue planes and included osseous structures are unchanged. IMPRESSION: 1. No central line identified. Endotracheal tube tip projects 4.6 cm above the carina. Nasogastric tube past proximal stomach. 2. Stable cardiomegaly. Increasing interstitial and RIGHT alveolar airspace opacities, this could reflect pulmonary edema and/or pneumonia. Electronically Signed   By: Elon Alas M.D.   On: 05/11/2018 22:36   Dg Chest Port 1 View  Result Date: 05/11/2018 CLINICAL DATA:  Intubation. EXAM: PORTABLE CHEST 1 VIEW COMPARISON:  Chest x-ray dated October 15, 2017. FINDINGS: Interval placement of an endotracheal tube with the tip approximately 1 cm above the carina. Cardiomegaly. Bilateral perihilar hazy opacities. No focal consolidation, pleural effusion, or pneumothorax. Gaseous distention of the stomach. No acute osseous abnormality. IMPRESSION: 1. Endotracheal tube tip 1 cm above the carina. Consider retraction 2-3 cm. 2. Cardiomegaly with hazy perihilar opacities, suspicious for mild pulmonary edema. Electronically Signed   By: Titus Dubin M.D.   On: 05/11/2018 18:26     Medications:   . sodium chloride  10 mL/hr at 05/13/18 0700  . ceFEPime (MAXIPIME) IV    . cisatracurium (NIMBEX) infusion 1 mcg/kg/min (05/13/18 0700)  . dextrose 30 mL/hr at 05/13/18 0700  . famotidine (PEPCID) IV Stopped (05/12/18 2308)  . fentaNYL infusion INTRAVENOUS 250 mcg/hr (05/13/18 0700)  . metronidazole 500 mg (05/13/18 0916)  . norepinephrine (LEVOPHED) Adult infusion 6 mcg/min (05/13/18 0756)  . propofol (DIPRIVAN) infusion 50 mcg/kg/min (05/13/18 0840)   . artificial tears  1 application Both Eyes H2D  . aspirin  81 mg Per Tube Daily  . atorvastatin  40 mg Oral q1800  . chlorhexidine gluconate (MEDLINE KIT)  15 mL Mouth Rinse BID  . clopidogrel  75 mg Oral Daily  . diltiazem  120 mg Oral Daily  . docusate  100 mg Per Tube BID  . mouth rinse  15 mL Mouth Rinse 10 times per day  . sodium chloride flush  10-40 mL Intracatheter Q12H   acetaminophen **OR** acetaminophen, [COMPLETED] cisatracurium **AND** cisatracurium (NIMBEX) infusion **AND** cisatracurium, fentaNYL, hydrALAZINE, sodium chloride flush, sorbitol  Assessment/ Plan:  [name] is a 62 y.o. black female with dialysis mellitus type II insulin dependent, hypertension, hyperlipidemia, peripheral vascular disease, chronic pancreatitis, anemia of chronic kidney disease admitted on 05/11/2018 for CHRONIC KIDNEY DISEASE  1. Acute renal failure on Chronic kidney disease stage V: with metabolic acidosis. With history of nephrotic range proteinuria.  Baseline creatinine of 4.68, GFR of 11.  Chronic kidney disease secondary to diabetic nephropathy.  No acute indication for dialysis. However with low threshold for initiating dialysis.  Discussed case with Husband who wants aggressive measures.  - Appreciate vascular and critical care input.  - If no improvement, will consider starting hemodialysis this admission.   2. Hypotension/cardiogenic shock: echocardiogram with diastolic dysfunction. Home regimen of bumetanide, carvedilol, diltiazem,  hydralazine, losartan, metoprolol, and spironolactone.  - Discontinued bumex, spironolactone, hydralazine and carvedilol due to hypotension/cardiogenic shock - Norepinephrine .   3. Cardiac arrest: placed on hypothermic protocol.  - Appreciate cardiology input  4. Anemia of chronic kidney disease: normocytic. Hemoglobin dropped to 7.8 Hold ESA due to ischemia  5. Secondary Hyperparathyroidism with hypocalcemia. PTH 365 on 04/04/18. Corrected calcium of 8.9, at goal. Phosphorus at goal.  Not currently on binders or vitamin D agent   6. Diabetes mellitus type II with chronic kidney disease: insulin dependent. History of poor control. Hemoglobin A1c  6.6% on 02/26/18.     LOS: 2 Kendyl Festa 9/1/201910:01 AM

## 2018-05-13 NOTE — Progress Notes (Addendum)
Pharmacy Antibiotic Note  Tracy Jennings is a 62 y.o. female admitted on 05/11/2018.  Pharmacy has been consulted for Cefepime dosing for aspiration.  Plan: Cefepime 1 g IV every 24 hours.  Height: 5' 3.5" (161.3 cm) Weight: 191 lb 12.8 oz (87 kg) IBW/kg (Calculated) : 53.55  Temp (24hrs), Avg:92.2 F (33.4 C), Min:90.1 F (32.3 C), Max:96.3 F (35.7 C)  Recent Labs  Lab 05/11/18 1922  05/12/18 0225  05/12/18 1310  05/12/18 1605 05/12/18 1806 05/12/18 2204 05/13/18 0211 05/13/18 0450 05/13/18 0706  WBC 12.3*  --  13.1*  --   --   --   --   --   --   --  5.7  --   CREATININE 5.73*   < > 5.76*   < >  --    < >  --  5.70* 5.69* 5.59* 5.58* 5.70*  LATICACIDVEN  --   --   --   --  1.1  --  1.2  --   --   --   --   --    < > = values in this interval not displayed.    Estimated Creatinine Clearance: 11.1 mL/min (A) (by C-G formula based on SCr of 5.7 mg/dL (H)).    Allergies  Allergen Reactions  . Hctz [Hydrochlorothiazide]     pancreatitis  . Amlodipine Swelling    Knees down to ankles  . Amlodipine Besylate Swelling  . Sulfa Antibiotics Rash  . Dairy Aid [Lactase]     Runny nose    Antimicrobials this admission: Cefazolin 8/30 >>8/31  Metronidazole 9/1 >> Cefepime 9/1 >>   Dose adjustments this admission:   Microbiology results: 8/30 MRSA PCR: negative  Thank you for allowing pharmacy to be a part of this patient's care.  Laural Benes, [facility] 05/13/2018 8:45 AM

## 2018-05-13 NOTE — Progress Notes (Signed)
MEDICATION RELATED CONSULT NOTE - INITIAL   Pharmacy Consult for Electrolytes    Labs: Recent Labs    05/11/18 1922 05/11/18 2105  05/12/18 0225 05/12/18 0356  05/12/18 2204 05/13/18 0211 05/13/18 0450  WBC 12.3*  --   --  13.1*  --   --   --   --  5.7  HGB 9.0*  --   --  8.7*  --   --   --   --  7.8*  HCT 27.5*  --   --  26.4*  --   --   --   --  23.1*  PLT 207  --   --  143*  --   --   --   --  174  APTT  --  27  --   --  29  --   --   --   --   CREATININE 5.73*  --    < > 5.76*  --    < > 5.69* 5.59* 5.58*  MG 1.7  --   --  1.9  --   --   --   --  1.8  PHOS 5.9*  --   --   --   --   --   --   --  4.2  ALBUMIN 2.8*  --   --   --   --   --   --   --  2.4*  PROT 5.8*  --   --   --   --   --   --   --  4.7*  AST 39  --   --   --   --   --   --   --  19  ALT 32  --   --   --   --   --   --   --  11  ALKPHOS 42  --   --   --   --   --   --   --  37*  BILITOT 0.5  --   --   --   --   --   --   --  0.5   < > = values in this interval not displayed.   Potassium (mmol/L)  Date Value  05/13/2018 3.4 (L)  06/14/2014 3.6   Magnesium (mg/dL)  Date Value  05/13/2018 1.8  12/10/2013 1.8   Calcium (mg/dL)  Date Value  05/13/2018 7.5 (L)   Calcium, Total (mg/dL)  Date Value  06/14/2014 8.4 (L)   Albumin (g/dL)  Date Value  05/13/2018 2.4 (L)  06/12/2014 2.9 (L)   Phosphorus (mg/dL)  Date Value  05/13/2018 4.2  ]  Estimated Creatinine Clearance: 11.3 mL/min (A) (by C-G formula based on SCr of 5.58 mg/dL (H)).   Assessment: Pharmacy consulted for electrolyte monitoring and replacement in 62 yo female with PMH of ESRD. Patient is S/P Cardiac arrest in OR. Induced hypothermia 8/31.   Goal of Therapy:  Electrolytes WNL  Plan:  9/1 @ 0530 K: 3.4  Rewarming process started @ 2300 on 8/31. Expect K+ to increase.  No replacement needed at this time.  Continue to conservatively replace K+ as needed.   Labs ordered every 4 hours while rewarming.   Pernell Dupre,  PharmD, BCPS Clinical Pharmacist 05/13/2018 6:15 AM

## 2018-05-13 NOTE — Progress Notes (Signed)
MEDICATION RELATED CONSULT NOTE - INITIAL   Pharmacy Consult for Electrolytes    Labs: Recent Labs    05/11/18 1922 05/11/18 2105  05/12/18 0225 05/12/18 0356  05/13/18 0450 05/13/18 0706 05/13/18 1058  WBC 12.3*  --   --  13.1*  --   --  5.7  --   --   HGB 9.0*  --   --  8.7*  --   --  7.8*  --   --   HCT 27.5*  --   --  26.4*  --   --  23.1*  --   --   PLT 207  --   --  143*  --   --  174  --   --   APTT  --  27  --   --  29  --   --   --   --   CREATININE 5.73*  --    < > 5.76*  --    < > 5.58* 5.70* 5.70*  MG 1.7  --   --  1.9  --   --  1.8  --   --   PHOS 5.9*  --   --   --   --   --  4.2  --   --   ALBUMIN 2.8*  --   --   --   --   --  2.4*  --   --   PROT 5.8*  --   --   --   --   --  4.7*  --   --   AST 39  --   --   --   --   --  19  --   --   ALT 32  --   --   --   --   --  11  --   --   ALKPHOS 42  --   --   --   --   --  37*  --   --   BILITOT 0.5  --   --   --   --   --  0.5  --   --    < > = values in this interval not displayed.   Potassium (mmol/L)  Date Value  05/13/2018 4.1  06/14/2014 3.6   Magnesium (mg/dL)  Date Value  05/13/2018 1.8  12/10/2013 1.8   Calcium (mg/dL)  Date Value  05/13/2018 7.7 (L)   Calcium, Total (mg/dL)  Date Value  06/14/2014 8.4 (L)   Albumin (g/dL)  Date Value  05/13/2018 2.4 (L)  06/12/2014 2.9 (L)   Phosphorus (mg/dL)  Date Value  05/13/2018 4.2  ]  Estimated Creatinine Clearance: 10.7 mL/min (A) (by C-G formula based on SCr of 5.7 mg/dL (H)).   Assessment: Pharmacy consulted for electrolyte monitoring and replacement in 62 yo female with PMH of ESRD. Patient is S/P Cardiac arrest in OR. Induced hypothermia 8/31.   Goal of Therapy:  Electrolytes WNL  Plan:  9/1 @ 1058 K: 4.1  Rewarming process started @ 2300 on 8/31. Expect K+ to increase.  No replacement needed at this time.  Continue to conservatively replace K+ as needed.   Labs ordered every 4 hours while rewarming.   Laural Benes, PharmD,  BCPS Clinical Pharmacist 05/13/2018 11:56 AM

## 2018-05-13 NOTE — Progress Notes (Signed)
MEDICATION RELATED CONSULT NOTE - INITIAL   Pharmacy Consult for Electrolytes    Labs: Recent Labs    05/11/18 1922 05/11/18 2105  05/12/18 0225 05/12/18 0356  05/13/18 0211 05/13/18 0450 05/13/18 0706  WBC 12.3*  --   --  13.1*  --   --   --  5.7  --   HGB 9.0*  --   --  8.7*  --   --   --  7.8*  --   HCT 27.5*  --   --  26.4*  --   --   --  23.1*  --   PLT 207  --   --  143*  --   --   --  174  --   APTT  --  27  --   --  29  --   --   --   --   CREATININE 5.73*  --    < > 5.76*  --    < > 5.59* 5.58* 5.70*  MG 1.7  --   --  1.9  --   --   --  1.8  --   PHOS 5.9*  --   --   --   --   --   --  4.2  --   ALBUMIN 2.8*  --   --   --   --   --   --  2.4*  --   PROT 5.8*  --   --   --   --   --   --  4.7*  --   AST 39  --   --   --   --   --   --  19  --   ALT 32  --   --   --   --   --   --  11  --   ALKPHOS 42  --   --   --   --   --   --  37*  --   BILITOT 0.5  --   --   --   --   --   --  0.5  --    < > = values in this interval not displayed.   Potassium (mmol/L)  Date Value  05/13/2018 3.6  06/14/2014 3.6   Magnesium (mg/dL)  Date Value  05/13/2018 1.8  12/10/2013 1.8   Calcium (mg/dL)  Date Value  05/13/2018 7.5 (L)   Calcium, Total (mg/dL)  Date Value  06/14/2014 8.4 (L)   Albumin (g/dL)  Date Value  05/13/2018 2.4 (L)  06/12/2014 2.9 (L)   Phosphorus (mg/dL)  Date Value  05/13/2018 4.2  ]  Estimated Creatinine Clearance: 11.1 mL/min (A) (by C-G formula based on SCr of 5.7 mg/dL (H)).   Assessment: Pharmacy consulted for electrolyte monitoring and replacement in 62 yo female with PMH of ESRD. Patient is S/P Cardiac arrest in OR. Induced hypothermia 8/31.   Goal of Therapy:  Electrolytes WNL  Plan:  9/1 @ 0706 K: 3.6  Rewarming process started @ 2300 on 8/31. Expect K+ to increase.  No replacement needed at this time.  Continue to conservatively replace K+ as needed.   Labs ordered every 4 hours while rewarming.   Laural Benes, PharmD,  BCPS Clinical Pharmacist 05/13/2018 8:10 AM

## 2018-05-13 NOTE — Progress Notes (Signed)
Notified Dr. Juleen China via telephone regarding patient's declining urine output for this shift so far. Dr. Juleen China acknowleged and will assess urine output in morning. See flow sheets for hourly urine output.

## 2018-05-14 ENCOUNTER — Inpatient Hospital Stay: Payer: Managed Care, Other (non HMO)

## 2018-05-14 LAB — BLOOD GAS, VENOUS
Acid-base deficit: 4.4 mmol/L — ABNORMAL HIGH (ref 0.0–2.0)
Acid-base deficit: 4.9 mmol/L — ABNORMAL HIGH (ref 0.0–2.0)
Bicarbonate: 21 mmol/L (ref 20.0–28.0)
Bicarbonate: 20.6 mmol/L (ref 20.0–28.0)
FIO2: 0.3
FIO2: 0.28
MECHVT: 500 mL
MECHVT: 500 mL
Mechanical Rate: 12
O2 Saturation: 71.2 %
O2 Saturation: 80.1 %
PEEP: 5 cmH2O
PEEP: 5 cmH2O
Patient temperature: 37
Patient temperature: 37
RATE: 12 resp/min
pCO2, Ven: 39 mmHg — ABNORMAL LOW (ref 44.0–60.0)
pCO2, Ven: 39 mmHg — ABNORMAL LOW (ref 44.0–60.0)
pH, Ven: 7.34 (ref 7.250–7.430)
pH, Ven: 7.33 (ref 7.250–7.430)
pO2, Ven: 40 mmHg (ref 32.0–45.0)
pO2, Ven: 48 mmHg — ABNORMAL HIGH (ref 32.0–45.0)

## 2018-05-14 LAB — BASIC METABOLIC PANEL
Anion gap: 7 (ref 5–15)
Anion gap: 12 (ref 5–15)
Anion gap: 9 (ref 5–15)
Anion gap: 10 (ref 5–15)
Anion gap: 5 (ref 5–15)
Anion gap: 7 (ref 5–15)
BUN: 58 mg/dL — ABNORMAL HIGH (ref 6–20)
BUN: 59 mg/dL — ABNORMAL HIGH (ref 6–20)
BUN: 59 mg/dL — ABNORMAL HIGH (ref 6–20)
BUN: 60 mg/dL — ABNORMAL HIGH (ref 6–20)
BUN: 47 mg/dL — ABNORMAL HIGH (ref 6–20)
BUN: 59 mg/dL — ABNORMAL HIGH (ref 6–20)
CO2: 22 mmol/L (ref 22–32)
CO2: 21 mmol/L — ABNORMAL LOW (ref 22–32)
CO2: 21 mmol/L — ABNORMAL LOW (ref 22–32)
CO2: 21 mmol/L — ABNORMAL LOW (ref 22–32)
CO2: 16 mmol/L — ABNORMAL LOW (ref 22–32)
CO2: 21 mmol/L — ABNORMAL LOW (ref 22–32)
Calcium: 7.5 mg/dL — ABNORMAL LOW (ref 8.9–10.3)
Calcium: 7.9 mg/dL — ABNORMAL LOW (ref 8.9–10.3)
Calcium: 7.8 mg/dL — ABNORMAL LOW (ref 8.9–10.3)
Calcium: 7.7 mg/dL — ABNORMAL LOW (ref 8.9–10.3)
Calcium: 5.9 mg/dL — CL (ref 8.9–10.3)
Calcium: 7.8 mg/dL — ABNORMAL LOW (ref 8.9–10.3)
Chloride: 112 mmol/L — ABNORMAL HIGH (ref 98–111)
Chloride: 110 mmol/L (ref 98–111)
Chloride: 113 mmol/L — ABNORMAL HIGH (ref 98–111)
Chloride: 112 mmol/L — ABNORMAL HIGH (ref 98–111)
Chloride: 124 mmol/L — ABNORMAL HIGH (ref 98–111)
Chloride: 116 mmol/L — ABNORMAL HIGH (ref 98–111)
Creatinine, Ser: 5.97 mg/dL — ABNORMAL HIGH (ref 0.44–1.00)
Creatinine, Ser: 5.68 mg/dL — ABNORMAL HIGH (ref 0.44–1.00)
Creatinine, Ser: 5.93 mg/dL — ABNORMAL HIGH (ref 0.44–1.00)
Creatinine, Ser: 5.86 mg/dL — ABNORMAL HIGH (ref 0.44–1.00)
Creatinine, Ser: 4.54 mg/dL — ABNORMAL HIGH (ref 0.44–1.00)
Creatinine, Ser: 5.71 mg/dL — ABNORMAL HIGH (ref 0.44–1.00)
GFR calc Af Amer: 8 mL/min — ABNORMAL LOW (ref >60)
GFR calc Af Amer: 9 mL/min — ABNORMAL LOW (ref >60)
GFR calc Af Amer: 8 mL/min — ABNORMAL LOW (ref >60)
GFR calc Af Amer: 8 mL/min — ABNORMAL LOW (ref >60)
GFR calc Af Amer: 11 mL/min — ABNORMAL LOW (ref >60)
GFR calc Af Amer: 8 mL/min — ABNORMAL LOW (ref >60)
GFR calc non Af Amer: 7 mL/min — ABNORMAL LOW (ref >60)
GFR calc non Af Amer: 7 mL/min — ABNORMAL LOW (ref >60)
GFR calc non Af Amer: 7 mL/min — ABNORMAL LOW (ref >60)
GFR calc non Af Amer: 7 mL/min — ABNORMAL LOW (ref >60)
GFR calc non Af Amer: 10 mL/min — ABNORMAL LOW (ref >60)
GFR calc non Af Amer: 7 mL/min — ABNORMAL LOW (ref >60)
Glucose, Bld: 170 mg/dL — ABNORMAL HIGH (ref 70–99)
Glucose, Bld: 152 mg/dL — ABNORMAL HIGH (ref 70–99)
Glucose, Bld: 188 mg/dL — ABNORMAL HIGH (ref 70–99)
Glucose, Bld: 199 mg/dL — ABNORMAL HIGH (ref 70–99)
Glucose, Bld: 154 mg/dL — ABNORMAL HIGH (ref 70–99)
Glucose, Bld: 194 mg/dL — ABNORMAL HIGH (ref 70–99)
Potassium: 4.3 mmol/L (ref 3.5–5.1)
Potassium: 4.2 mmol/L (ref 3.5–5.1)
Potassium: 4.3 mmol/L (ref 3.5–5.1)
Potassium: 4.4 mmol/L (ref 3.5–5.1)
Potassium: 3.2 mmol/L — ABNORMAL LOW (ref 3.5–5.1)
Potassium: 4.1 mmol/L (ref 3.5–5.1)
Sodium: 141 mmol/L (ref 135–145)
Sodium: 143 mmol/L (ref 135–145)
Sodium: 143 mmol/L (ref 135–145)
Sodium: 143 mmol/L (ref 135–145)
Sodium: 145 mmol/L (ref 135–145)
Sodium: 144 mmol/L (ref 135–145)

## 2018-05-14 LAB — BLOOD GAS, ARTERIAL
Acid-base deficit: 3.6 mmol/L — ABNORMAL HIGH (ref 0.0–2.0)
Bicarbonate: 20.6 mmol/L (ref 20.0–28.0)
FIO2: 0.3
MECHVT: 500 mL
Mechanical Rate: 12
O2 Saturation: 99.3 %
PEEP: 5 cmH2O
Patient temperature: 37
pCO2 arterial: 34 mmHg (ref 32.0–48.0)
pH, Arterial: 7.39 (ref 7.350–7.450)
pO2, Arterial: 152 mmHg — ABNORMAL HIGH (ref 83.0–108.0)

## 2018-05-14 LAB — CBC WITH DIFFERENTIAL/PLATELET
Basophils Absolute: 0 10*3/uL (ref 0–0.1)
Basophils Relative: 0 %
Eosinophils Absolute: 0.1 10*3/uL (ref 0–0.7)
Eosinophils Relative: 1 %
HCT: 25.1 % — ABNORMAL LOW (ref 35.0–47.0)
Hemoglobin: 8.6 g/dL — ABNORMAL LOW (ref 12.0–16.0)
Lymphocytes Relative: 18 %
Lymphs Abs: 1.3 10*3/uL (ref 1.0–3.6)
MCH: 30.8 pg (ref 26.0–34.0)
MCHC: 34.2 g/dL (ref 32.0–36.0)
MCV: 90.1 fL (ref 80.0–100.0)
Monocytes Absolute: 0.7 10*3/uL (ref 0.2–0.9)
Monocytes Relative: 9 %
Neutro Abs: 5.2 10*3/uL (ref 1.4–6.5)
Neutrophils Relative %: 72 %
Platelets: 170 10*3/uL (ref 150–440)
RBC: 2.79 MIL/uL — ABNORMAL LOW (ref 3.80–5.20)
RDW: 14.2 % (ref 11.5–14.5)
WBC: 7.2 10*3/uL (ref 3.6–11.0)

## 2018-05-14 LAB — GLUCOSE, CAPILLARY
Glucose-Capillary: 139 mg/dL — ABNORMAL HIGH (ref 70–99)
Glucose-Capillary: 77 mg/dL (ref 70–99)
Glucose-Capillary: 166 mg/dL — ABNORMAL HIGH (ref 70–99)

## 2018-05-14 LAB — ALBUMIN: Albumin: 1.7 g/dL — ABNORMAL LOW (ref 3.5–5.0)

## 2018-05-14 MED ORDER — IPRATROPIUM-ALBUTEROL 0.5-2.5 (3) MG/3ML IN SOLN
3.0000 mL | Freq: Four times a day (QID) | RESPIRATORY_TRACT | Status: DC
  Administered 2018-05-14 – 2018-05-19 (×15): 3 mL via RESPIRATORY_TRACT
  Filled 2018-05-14 (×15): qty 3

## 2018-05-14 MED ORDER — MIDAZOLAM HCL 2 MG/2ML IJ SOLN
2.0000 mg | INTRAMUSCULAR | Status: DC | PRN
  Administered 2018-05-15: 2 mg via INTRAVENOUS
  Filled 2018-05-14: qty 2

## 2018-05-14 MED ORDER — MIDAZOLAM HCL 2 MG/2ML IJ SOLN
INTRAMUSCULAR | Status: AC
  Administered 2018-05-14: 2 mg via INTRAVENOUS
  Filled 2018-05-14: qty 2

## 2018-05-14 MED ORDER — POTASSIUM CHLORIDE 20 MEQ PO PACK
20.0000 meq | PACK | Freq: Once | ORAL | Status: DC
  Filled 2018-05-14: qty 1

## 2018-05-14 MED ORDER — MIDAZOLAM HCL 2 MG/2ML IJ SOLN
2.0000 mg | INTRAMUSCULAR | Status: DC | PRN
  Administered 2018-05-14: 2 mg via INTRAVENOUS

## 2018-05-14 MED ORDER — ACETYLCYSTEINE 20 % IN SOLN
3.0000 mL | Freq: Two times a day (BID) | RESPIRATORY_TRACT | Status: DC
  Administered 2018-05-14 – 2018-05-19 (×8): 3 mL via RESPIRATORY_TRACT
  Filled 2018-05-14 (×10): qty 4

## 2018-05-14 NOTE — Progress Notes (Signed)
Name: Tracy Jennings MRN: [id number] DOB: 1956/07/09     CONSULTATION DATE: 05/11/2018 Subjective & objectives: Status post induced hypothermia, off paralytics, sedated with fentanyl and Precedex.  Off pressors.  Moving all extremities however does not follow commands   PAST MEDICAL HISTORY :   has a past medical history of Anemia (04/2018), CKD (chronic kidney disease), Diabetes mellitus without complication ([place]), ESRD (end stage renal disease) ([place]), Heart murmur, Hyperlipidemia associated with type 2 diabetes mellitus ([place]), Hypertension, Pancreatitis, and Peripheral vascular disease ([place]).  has a past surgical history that includes Cataract extraction; Amputation toe (Left, 2013); Colonoscopy; Colonoscopy with propofol (N/A, 01/10/2018); Eye surgery (Bilateral, 2018); Cholecystectomy (2014); Tubal ligation (1984); AV fistula placement (Left, 05/11/2018); and Thrombectomy w/ embolectomy (05/11/2018). Prior to Admission medications   Medication Sig Start Date End Date Taking? Authorizing Provider  atorvastatin (LIPITOR) 40 MG tablet Take 40 mg by mouth daily at 6 PM.  10/05/15 05/11/18 Yes [provider]  bumetanide (BUMEX) 1 MG tablet Take 2 mg by mouth 2 (two) times daily.  02/28/18 02/28/19 Yes [provider]  carvedilol (COREG CR) 40 MG 24 hr capsule Take 40 mg by mouth daily. Takes in the morning when she gets home from work 11/06/17  Yes [provider]  diltiazem (CARDIZEM LA) 120 MG 24 hr tablet Take 1 tablet (120 mg total) by mouth daily. Patient taking differently: Take 120 mg by mouth daily. Takes in the morning when she gets home from work 08/08/17  Yes Cuthriell, Charline Bills, PA-C  fluticasone (FLONASE) 50 MCG/ACT nasal spray 2 sprays by Each Nare route daily. 01/15/18 01/15/19 Yes [provider]  hydrALAZINE (APRESOLINE) 50 MG tablet Take 50 mg by mouth every 8 (eight) hours.  10/24/17  Yes [provider]  Insulin Glargine 300 UNIT/ML SOPN  Inject 40 Units into the skin daily. In the morning.  (toujeo) 11/30/15  Yes [provider]  amLODipine (NORVASC) 5 MG tablet Take 1 tablet by mouth once daily 01/18/16   [provider]  aspirin EC 81 MG tablet Take 81 mg by mouth daily. 09/27/17   [provider]  HYDROcodone-acetaminophen (NORCO) 5-325 MG tablet Take 1-2 tablets by mouth every 6 (six) hours as needed for moderate pain or severe pain. 05/11/18   Schnier, Dolores Lory, MD  insulin aspart (NOVOLOG) 100 UNIT/ML FlexPen Inject 10-12 Units into the skin 3 (three) times daily with meals.  12/28/15   [provider]  losartan (COZAAR) 100 MG tablet Take 1 tablet by mouth once daily 01/18/16   [provider]  metoprolol (LOPRESSOR) 50 MG tablet Take 50 mg by mouth daily.  02/20/15   [provider]  ondansetron (ZOFRAN) 4 MG tablet Take 1 tablet (4 mg total) by mouth every 6 (six) hours as needed for nausea. Patient not taking: Reported on 05/04/2018 11/03/17   Epifanio Lesches, MD  oxyCODONE (OXY IR/ROXICODONE) 5 MG immediate release tablet Take 1 tablet (5 mg total) by mouth every 4 (four) hours as needed for moderate pain. Patient not taking: Reported on 05/04/2018 11/03/17   Epifanio Lesches, MD  spironolactone (ALDACTONE) 50 MG tablet Take 50 mg by mouth daily. 09/27/17   [provider]  Vitamin D, Ergocalciferol, (DRISDOL) 50000 units CAPS capsule TAKE ONE CAPSULE BY MOUTH ONE TIME PER WEEK 01/06/18   [provider]   Allergies  Allergen Reactions  . Hctz [Hydrochlorothiazide]     pancreatitis  . Amlodipine Swelling    Knees down to  ankles  . Amlodipine Besylate Swelling  . Sulfa Antibiotics Rash  . Dairy Aid [Lactase]     Runny nose    FAMILY HISTORY:  family history includes Cancer in her father; Diabetes in her maternal grandfather, maternal grandmother, paternal grandfather, paternal grandmother, and sister; Hypertension in her mother; Stroke in her  mother. SOCIAL HISTORY:  reports that she has never smoked. She has never used smokeless tobacco. She reports that she does not drink alcohol or use drugs.  REVIEW OF SYSTEMS:   Unable to obtain due to critical illness   VITAL SIGNS: Temp:  [98.1 F (36.7 C)-99.9 F (37.7 C)] 98.6 F (37 C) (09/02 1300) Pulse Rate:  [67-75] 67 (09/02 1500) Resp:  [12-24] 12 (09/02 1500) BP: (119-195)/(52-80) 170/68 (09/02 1500) SpO2:  [100 %] 100 % (09/02 1500) FiO2 (%):  [28 %-30 %] 28 % (09/02 1414) Weight:  [87.7 kg-88.1 kg] 88.1 kg (09/02 0410)   Physical Examination: RASS of -2. S/p Induced hypothermia protocol, moves extremities and not following commands. Detailed neuro exam as per neurology On vent, no distress, bilateral equal air entry and no adventitious sounds S1 & S2 are audible with no murmur Benign abdominal exam with feeble peristalsis Dress left arm newly created AV fistula, no thrill palpable No edema   ASSESSMENT / PLAN:  Acute respiratory failure.On hypothermia protocol. -Monitor ABG, optimize ventilator settings.  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  Status post induced hypothermia. -Monitor neuro status and management as per neurology  Cardiac arrest in OR after refashioning of left arm AV fistula created for dialysis. ECHO LVEF 00-92%, diastolic dysfunction grade I. Elevated Troponin, no ischemic changes on EKG. ASA + Plavix -Status post induced hypothermia -Follow with cardiology for further work-up and recommendation. -Hemodynamic monitoring.  Atelectasis and pneumonia with possible aspiration s/p cardiac arrest.  Improved bibasilar airspace disease.  MRSA PCR negative -Empiric Cefep + Flagyl. Monitor CXR + CBC + FIO2.  CHF with pulmonary congestion on chest x-ray post intubation -Diuresis/RRT as per renal to improve lung  compliance.  Hypotension with meds / sepsis (improved) -Monitor hemodynamics, procal., Lactic acid (0.6) and optimize meds.  HTN -Optimize antihypertensives and monitor hemodynamics.  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 S/p placement of Lt. Arm AVF.  Needs dialysis access -Management as per renal. Not on RRT.  Left arm AV fistulawith questionable malfunctioning -Management as per vascular  Diabetes mellitus -Glycemic control  Dyslipidemia -Statin  Anemia -Hemoglobin more than 7 g/dL  Thrombocytopenia -Monitor Plat  Full code  DVT and GI prophylaxis. Continue with supportive care  Critical care time 40 minutes

## 2018-05-14 NOTE — Progress Notes (Signed)
Continuing Normothermic phase of [place]. Noted to have spontaneous purposeful movement of upper extremities, head and trying to sit up. Remains in normal sinus rhythm without issues. Husband at bedside.

## 2018-05-14 NOTE — Progress Notes (Signed)
[place] NOTE   Pharmacy Consult for Electrolytes    Labs: Recent Labs    05/11/18 1922 05/11/18 2105  05/12/18 0225 05/12/18 0356  05/13/18 0450  05/14/18 0323 05/14/18 0755 05/14/18 1154  WBC 12.3*  --   --  13.1*  --   --  5.7  --  7.2  --   --   HGB 9.0*  --   --  8.7*  --   --  7.8*  --  8.6*  --   --   HCT 27.5*  --   --  26.4*  --   --  23.1*  --  25.1*  --   --   PLT 207  --   --  143*  --   --  174  --  170  --   --   APTT  --  27  --   --  29  --   --   --   --   --   --   CREATININE 5.73*  --    < > 5.76*  --    < > 5.58*   < > 5.97* 5.68* 5.93*  MG 1.7  --   --  1.9  --   --  1.8  --   --   --   --   PHOS 5.9*  --   --   --   --   --  4.2  --   --   --   --   ALBUMIN 2.8*  --   --   --   --   --  2.4*  --   --   --   --   PROT 5.8*  --   --   --   --   --  4.7*  --   --   --   --   AST 39  --   --   --   --   --  19  --   --   --   --   ALT 32  --   --   --   --   --  11  --   --   --   --   ALKPHOS 42  --   --   --   --   --  37*  --   --   --   --   BILITOT 0.5  --   --   --   --   --  0.5  --   --   --   --    < > = values in this interval not displayed.   Potassium (mmol/L)  Date Value  05/14/2018 4.3  06/14/2014 3.6   Magnesium (mg/dL)  Date Value  05/13/2018 1.8  12/10/2013 1.8   Calcium (mg/dL)  Date Value  05/14/2018 7.8 (L)   Calcium, Total (mg/dL)  Date Value  06/14/2014 8.4 (L)   Albumin (g/dL)  Date Value  05/13/2018 2.4 (L)  06/12/2014 2.9 (L)   Phosphorus (mg/dL)  Date Value  05/13/2018 4.2  ]  Estimated Creatinine Clearance: 10.7 mL/min (A) (by C-G formula based on SCr of 5.93 mg/dL (H)).   Assessment: Pharmacy consulted for electrolyte monitoring and replacement in 62 yo female with PMH of ESRD. Patient is S/P Cardiac arrest in OR. Induced hypothermia 8/31.   Rewarming process started @ 2300 on 8/31. Expect K+ to increase. Labs ordered every 4 hours while  rewarming.   Goal of Therapy:  Electrolytes  WNL  Plan:  9/2 1154  K 4.3.  No additional replacement needed at this time. Labs ordered every 4 hours. Pharmacy will continue to monitor and replace electrolytes as needed.   Noralee Space, PharmD, BCPS Clinical Pharmacist 05/14/2018 12:57 PM

## 2018-05-14 NOTE — Progress Notes (Signed)
[facility]  ROUNDING NOTE   Subjective:   Hypothermic protocol.  Off vasopressors this morning.   Creatinine 5.97  UOP 1045m.   Objective:  Vital signs in last 24 hours:  Temp:  [97.9 F (36.6 C)-99.3 F (37.4 C)] 99.3 F (37.4 C) (09/02 0630) Pulse Rate:  [70-84] 73 (09/02 0800) Resp:  [0-16] 12 (09/02 0800) BP: (102-210)/(50-88) 179/70 (09/02 0800) SpO2:  [100 %] 100 % (09/02 0831) FiO2 (%):  [28 %-30 %] 28 % (09/02 0831) Weight:  [87.7 kg-88.1 kg] 88.1 kg (09/02 0410)  Weight change: -6.6 kg Filed Weights   05/13/18 0800 05/13/18 1600 05/14/18 0410  Weight: 80.4 kg 87.7 kg 88.1 kg    Intake/Output: I/O last 3 completed shifts: In: 4339.1 [I.V.:3379.2; NG/GT:160; IV Piggyback:799.9] Out: 2100 [Urine:1425; Emesis/NG output:675]   Intake/Output this shift:  Total I/O In: 85 [I.V.:85] Out: 100 [Urine:100]  Physical Exam: General: Critically ill  Head: Normocephalic, atraumatic. ETT OGT  Eyes: Anicteric   Neck: Supple, trachea midline  Lungs:  Clear to auscultation, PRVC FiO 30%  Heart: Regular rate and rhythm  Abdomen:  Soft, nontender,   Extremities: Trace peripheral edema.  Neurologic: Intubated and sedated  Skin: No lesions  Access: Left AVF - no thrill or bruit appreciated.     Basic Metabolic Panel: Recent Labs  Lab 05/11/18 1922  05/12/18 0225  05/13/18 0450  05/13/18 1518 05/13/18 1849 05/13/18 2206 05/14/18 0323 05/14/18 0755  NA 139   < > 141   < > 143   < > 142 140 141 141 143  K 5.3*   < > 4.1   < > 3.4*   < > 4.5 4.6 4.3 4.3 4.2  CL 111   < > 109   < > 112*   < > 111 111 113* 112* 110  CO2 21*   < > 22   < > 21*   < > 23 21* 21* 22 21*  GLUCOSE 192*   < > 235*   < > 106*   < > 163* 199* 193* 170* 152*  BUN 59*   < > 61*   < > 62*   < > 61* 58* 59* 58* 59*  CREATININE 5.73*   < > 5.76*   < > 5.58*   < > 5.69* 5.63* 5.45* 5.97* 5.68*  CALCIUM 8.0*   < > 7.9*   < > 7.5*   < > 7.6* 7.6* 7.2* 7.5* 7.9*  MG 1.7  --  1.9   --  1.8  --   --   --   --   --   --   PHOS 5.9*  --   --   --  4.2  --   --   --   --   --   --    < > = values in this interval not displayed.    Liver Function Tests: Recent Labs  Lab 05/11/18 1922 05/13/18 0450  AST 39 19  ALT 32 11  ALKPHOS 42 37*  BILITOT 0.5 0.5  PROT 5.8* 4.7*  ALBUMIN 2.8* 2.4*   No results for input(s): LIPASE, AMYLASE in the last 168 hours. No results for input(s): AMMONIA in the last 168 hours.  CBC: Recent Labs  Lab 05/11/18 1922 05/12/18 0225 05/13/18 0450 05/14/18 0323  WBC 12.3* 13.1* 5.7 7.2  NEUTROABS 11.2*  --  3.7 5.2  HGB 9.0* 8.7* 7.8* 8.6*  HCT 27.5* 26.4* 23.1* 25.1*  MCV  90.5 89.7 87.8 90.1  PLT 207 143* 174 170    Cardiac Enzymes: Recent Labs  Lab 05/11/18 1922 05/11/18 2105 05/12/18 0225 05/12/18 0818 05/12/18 1413  TROPONINI 0.03* 0.05* 0.08* 0.07* 0.06*    BNP: Invalid input(s): POCBNP  CBG: Recent Labs  Lab 05/13/18 1256 05/13/18 1434 05/13/18 1536 05/13/18 11/26/2051 05/14/18 0336  GLUCAP 125* 128* 141* 188* 139*    Microbiology: Results for orders placed or performed during the hospital encounter of 05/11/18  MRSA PCR Screening     Status: None   Collection Time: 05/11/18  7:56 PM  Result Value Ref Range Status   MRSA by PCR NEGATIVE NEGATIVE Final    Comment:        The GeneXpert MRSA Assay (FDA approved for NASAL specimens only), is one component of a comprehensive MRSA colonization surveillance program. It is not intended to diagnose MRSA infection nor to guide or monitor treatment for MRSA infections. Performed at [facility], [address] [place], [place] [address]   CULTURE, BLOOD (ROUTINE X 2) w Reflex to ID Panel     Status: None (Preliminary result)   Collection Time: 05/13/18  3:18 PM  Result Value Ref Range Status   Specimen Description BLOOD BFOA  Final   Special Requests   Final    BOTTLES DRAWN AEROBIC AND ANAEROBIC Blood Culture adequate volume   Culture   Final     NO GROWTH < 24 HOURS Performed at [facility], [address], [place], [place] [address]    Report Status PENDING  Incomplete  CULTURE, BLOOD (ROUTINE X 2) w Reflex to ID Panel     Status: None (Preliminary result)   Collection Time: 05/13/18  4:33 PM  Result Value Ref Range Status   Specimen Description BLOOD L HAND  Final   Special Requests   Final    BOTTLES DRAWN AEROBIC AND ANAEROBIC Blood Culture adequate volume   Culture   Final    NO GROWTH < 24 HOURS Performed at [facility], [address] [place], [place] [address]    Report Status PENDING  Incomplete    Coagulation Studies: Recent Labs    05/11/18 2103-11-26 05/12/18 0356  LABPROT 13.8 13.5  INR 1.07 1.04    Urinalysis: No results for input(s): COLORURINE, LABSPEC, PHURINE, GLUCOSEU, HGBUR, BILIRUBINUR, KETONESUR, PROTEINUR, UROBILINOGEN, NITRITE, LEUKOCYTESUR in the last 72 hours.  Invalid input(s): APPERANCEUR    Imaging: Dg Chest Port 1 View  Result Date: 05/14/2018 CLINICAL DATA:  CHF, intubated EXAM: PORTABLE CHEST 1 VIEW COMPARISON:  05/12/2018 FINDINGS: Stable support apparatus. Low lung volumes persist with worsening left lower lobe collapse/consolidation obscuring the hemidiaphragm. Interval improvement in the right upper lobe patchy airspace disease. No enlarging effusion or pneumothorax. Trachea is midline. Monitor leads overlie the chest. Remote cholecystectomy. IMPRESSION: Improving right upper lobe aeration Worsening left lower lobe collapse/consolidation Electronically Signed   By: Jerilynn Mages.  Shick M.D.   On: 05/14/2018 08:40   Dg Chest Port 1 View  Result Date: 05/12/2018 CLINICAL DATA:  Intubated patient.  Hospitalized for cardiac arrest. EXAM: PORTABLE CHEST 1 VIEW COMPARISON:  05/11/2018 and older exams. FINDINGS: Endotracheal tube projects 3.2 cm above the Carina. Nasal/orogastric tube passes below the diaphragm well into the stomach. There is patchy airspace opacity in the right  upper lobe. This is similar to the previous day's study allowing for differences in technique. Remainder of the lungs is clear with no convincing pulmonary edema. No visible pleural effusion. No pneumothorax.  Heart is normal in size and configuration. No mediastinal or hilar masses. IMPRESSION: 1. Endotracheal tube and nasal/orogastric tube are well positioned. 2. Patchy airspace opacity in the right upper lobe. Consider pneumonia or aspiration pneumonitis. There is no evidence of pulmonary edema. 3. No pneumothorax. Electronically Signed   By: Lajean Manes M.D.   On: 05/12/2018 11:50     Medications:   . sodium chloride 10 mL/hr at 05/14/18 0911  . ceFEPime (MAXIPIME) IV    . dexmedetomidine (PRECEDEX) IV infusion 0.6 mcg/kg/hr (05/14/18 0600)  . dextrose Stopped (05/13/18 1835)  . famotidine (PEPCID) IV Stopped (05/13/18 2246)  . fentaNYL infusion INTRAVENOUS 250 mcg/hr (05/14/18 0600)  . metronidazole 500 mg (05/14/18 0910)  . norepinephrine (LEVOPHED) Adult infusion Stopped (05/13/18 1516)  . vasopressin (PITRESSIN) infusion - *FOR SHOCK* Stopped (05/13/18 2052)   . artificial tears  1 application Both Eyes K4Y  . aspirin  81 mg Per Tube Daily  . atorvastatin  40 mg Oral q1800  . chlorhexidine gluconate (MEDLINE KIT)  15 mL Mouth Rinse BID  . clopidogrel  75 mg Oral Daily  . diltiazem  120 mg Oral Daily  . docusate  100 mg Per Tube BID  . mouth rinse  15 mL Mouth Rinse 10 times per day  . sodium chloride flush  10-40 mL Intracatheter Q12H   acetaminophen **OR** acetaminophen, fentaNYL, hydrALAZINE, sodium chloride flush, sorbitol  Assessment/ Plan:  Ms. ANASTASYA JEWELL is a 62 y.o. black female with dialysis mellitus type II insulin dependent, hypertension, hyperlipidemia, peripheral vascular disease, chronic pancreatitis, anemia of chronic kidney disease admitted on 05/11/2018 for AVF creation by Dr. Delana Meyer. Post operative complication of cir  1. Acute renal failure on Chronic  kidney disease stage V: with metabolic acidosis. With history of nephrotic range proteinuria.  Baseline creatinine of 4.68, GFR of 11.  Chronic kidney disease secondary to diabetic nephropathy.  No acute indication for dialysis. However with low threshold for initiating dialysis. Currently without access, would recommend tunneled catheter be placed. Outpatient planning would be [facility], [name].  Discussed case with Husband who wants aggressive measures.  - Appreciate vascular and critical care input.  - If no improvement, will consider starting hemodialysis this admission.   2. Hypotension/cardiogenic shock: echocardiogram with diastolic dysfunction. Home regimen of bumetanide, carvedilol, diltiazem, hydralazine, losartan, metoprolol, and spironolactone.  - Discontinued bumex, spironolactone, hydralazine and carvedilol due to hypotension/cardiogenic shock - Norepinephrine and vasopressin now weaned. .   3. Cardiac arrest: placed on hypothermic protocol.  - Appreciate cardiology input  4. Anemia of chronic kidney disease: normocytic. Hemoglobin dropped to 8.6 Hold ESA due to ischemia  5. Secondary Hyperparathyroidism with hypocalcemia. PTH 365 on 04/04/18. Corrected calcium of 8.9, at goal. Phosphorus at goal.  Not currently on binders or vitamin D agent   6. Diabetes mellitus type II with chronic kidney disease: insulin dependent. History of poor control. Hemoglobin A1c 6.6% on 02/26/18.     LOS: 3 Jamiah Homeyer 9/2/20199:37 AM

## 2018-05-14 NOTE — Progress Notes (Addendum)
When awake moving more purposefully. Trying to sit up and pull out ETT. Mitts placed on hands bilaterally. Also trying to sling her left leg out of bed and sit up. B/P labile- not treated with Apresoline. Remains sedated  on Precedex and Fentanyl.

## 2018-05-14 NOTE — Progress Notes (Signed)
Patient Name: Tracy Jennings Date of Encounter: 05/14/2018  Hospital Problem List     Active Problems:   Complication of vascular access for dialysis   Respiratory arrest [facility])    Patient Profile     62 year old female suffering respiratory arrest post AV fistula revision.  Currently intubated on hypothermia protocol of started on on May 12, 2018.  Currently on rewarming phase.  Subjective   Intubated  Inpatient Medications    . artificial tears  1 application Both Eyes K4Y  . aspirin  81 mg Per Tube Daily  . atorvastatin  40 mg Oral q1800  . chlorhexidine gluconate (MEDLINE KIT)  15 mL Mouth Rinse BID  . clopidogrel  75 mg Oral Daily  . diltiazem  120 mg Oral Daily  . docusate  100 mg Per Tube BID  . mouth rinse  15 mL Mouth Rinse 10 times per day  . sodium chloride flush  10-40 mL Intracatheter Q12H    Vital Signs    Vitals:   05/14/18 0700 05/14/18 0758 05/14/18 0800 05/14/18 0831  BP: (!) 143/60 (!) 194/72 (!) 179/70   Pulse: 70  73   Resp: 12  12   Temp:      TempSrc:      SpO2: 100%  100% 100%  Weight:      Height:        Intake/Output Summary (Last 24 hours) at 05/14/2018 0859 Last data filed at 05/14/2018 0800 Gross per 24 hour  Intake 2950.6 ml  Output 1818 ml  Net 1132.6 ml   Filed Weights   05/13/18 0800 05/13/18 1600 05/14/18 0410  Weight: 80.4 kg 87.7 kg 88.1 kg    Physical Exam    GEN: Intubated.  HEENT: normal.  Neck: Supple, no JVD, carotid bruits, or masses. Cardiac: RRR, no murmurs, rubs, or gallops. No clubbing, cyanosis, edema.  Radials/DP/PT 2+ and equal bilaterally.  Respiratory: Thin with ventilator. GI: Soft, nontender, nondistended, BS + x 4. MS: no deformity or atrophy. Skin: warm and dry, no rash. Neuro: Intubated.  Labs    CBC Recent Labs    05/13/18 0450 05/14/18 0323  WBC 5.7 7.2  NEUTROABS 3.7 5.2  HGB 7.8* 8.6*  HCT 23.1* 25.1*  MCV 87.8 90.1  PLT 174 185   Basic Metabolic Panel Recent Labs     05/11/18 1922  05/12/18 0225  05/13/18 0450  05/14/18 0323 05/14/18 0755  NA 139   < > 141   < > 143   < > 141 143  K 5.3*   < > 4.1   < > 3.4*   < > 4.3 4.2  CL 111   < > 109   < > 112*   < > 112* 110  CO2 21*   < > 22   < > 21*   < > 22 21*  GLUCOSE 192*   < > 235*   < > 106*   < > 170* 152*  BUN 59*   < > 61*   < > 62*   < > 58* 59*  CREATININE 5.73*   < > 5.76*   < > 5.58*   < > 5.97* 5.68*  CALCIUM 8.0*   < > 7.9*   < > 7.5*   < > 7.5* 7.9*  MG 1.7  --  1.9  --  1.8  --   --   --   PHOS 5.9*  --   --   --  4.2  --   --   --    < > = values in this interval not displayed.   Liver Function Tests Recent Labs    05/11/18 1922 05/13/18 0450  AST 39 19  ALT 32 11  ALKPHOS 42 37*  BILITOT 0.5 0.5  PROT 5.8* 4.7*  ALBUMIN 2.8* 2.4*   No results for input(s): LIPASE, AMYLASE in the last 72 hours. Cardiac Enzymes Recent Labs    05/12/18 0225 05/12/18 0818 05/12/18 1413  TROPONINI 0.08* 0.07* 0.06*   BNP No results for input(s): BNP in the last 72 hours. D-Dimer No results for input(s): DDIMER in the last 72 hours. Hemoglobin A1C No results for input(s): HGBA1C in the last 72 hours. Fasting Lipid Panel No results for input(s): CHOL, HDL, LDLCALC, TRIG, CHOLHDL, LDLDIRECT in the last 72 hours. Thyroid Function Tests No results for input(s): TSH, T4TOTAL, T3FREE, THYROIDAB in the last 72 hours.  Invalid input(s): FREET3  Telemetry    Normal sinus rhythm  ECG    Sinus rhythm with prolonged QT.  Radiology    Dg Abd 1 View  Result Date: 05/11/2018 CLINICAL DATA:  Orogastric tube placement. EXAM: ABDOMEN - 1 VIEW COMPARISON:  CT abdomen and pelvis August 07, 2016 FINDINGS: Nasogastric tube tip projects in mid stomach, side port past GE junction region. Included bowel gas pattern is nondilated and nonobstructive. Surgical clips in the included right abdomen compatible with cholecystectomy. Included soft tissue planes and osseous structures are non suspicious.  IMPRESSION: Nasogastric tube tip projects mid stomach. Electronically Signed   By: Elon Alas M.D.   On: 05/11/2018 22:37   Ct Head Wo Contrast  Result Date: 05/11/2018 CLINICAL DATA:  Cardiac arrest. EXAM: CT HEAD WITHOUT CONTRAST TECHNIQUE: Contiguous axial images were obtained from the base of the skull through the vertex without intravenous contrast. COMPARISON:  None. FINDINGS: Brain: No evidence of acute infarction, hemorrhage, hydrocephalus, extra-axial collection or mass lesion/mass effect. Mild generalized cerebral atrophy. Vascular: No hyperdense vessel or unexpected calcification. Skull: Normal. Negative for fracture or focal lesion. Sinuses/Orbits: Scattered ethmoid air cell mucosal thickening with small air-fluid levels. Small air-fluid level in the right sphenoid sinus. Mastoid air cells are clear. Other: None. IMPRESSION: 1.  No acute intracranial abnormality. Electronically Signed   By: Titus Dubin M.D.   On: 05/11/2018 20:02   Dg Chest Port 1 View  Result Date: 05/14/2018 CLINICAL DATA:  CHF, intubated EXAM: PORTABLE CHEST 1 VIEW COMPARISON:  05/12/2018 FINDINGS: Stable support apparatus. Low lung volumes persist with worsening left lower lobe collapse/consolidation obscuring the hemidiaphragm. Interval improvement in the right upper lobe patchy airspace disease. No enlarging effusion or pneumothorax. Trachea is midline. Monitor leads overlie the chest. Remote cholecystectomy. IMPRESSION: Improving right upper lobe aeration Worsening left lower lobe collapse/consolidation Electronically Signed   By: Jerilynn Mages.  Shick M.D.   On: 05/14/2018 08:40   Dg Chest Port 1 View  Result Date: 05/12/2018 CLINICAL DATA:  Intubated patient.  Hospitalized for cardiac arrest. EXAM: PORTABLE CHEST 1 VIEW COMPARISON:  05/11/2018 and older exams. FINDINGS: Endotracheal tube projects 3.2 cm above the Carina. Nasal/orogastric tube passes below the diaphragm well into the stomach. There is patchy airspace  opacity in the right upper lobe. This is similar to the previous day's study allowing for differences in technique. Remainder of the lungs is clear with no convincing pulmonary edema. No visible pleural effusion. No pneumothorax. Heart is normal in size and configuration. No mediastinal or hilar masses. IMPRESSION: 1. Endotracheal tube  and nasal/orogastric tube are well positioned. 2. Patchy airspace opacity in the right upper lobe. Consider pneumonia or aspiration pneumonitis. There is no evidence of pulmonary edema. 3. No pneumothorax. Electronically Signed   By: Lajean Manes M.D.   On: 05/12/2018 11:50   Dg Chest Port 1 View  Result Date: 05/11/2018 CLINICAL DATA:  Central line placement. EXAM: PORTABLE CHEST 1 VIEW COMPARISON:  Chest radiograph May 11, 2018 at 1808 hours FINDINGS: No central line identified. Endotracheal tube tip projects 4.6 cm above the carina. Nasogastric tube past proximal stomach, out of field of view. Stable cardiomegaly with interstitial prominence and patchy RIGHT upper lobe airspace opacities. No pleural effusion. No pneumothorax. Soft tissue planes and included osseous structures are unchanged. IMPRESSION: 1. No central line identified. Endotracheal tube tip projects 4.6 cm above the carina. Nasogastric tube past proximal stomach. 2. Stable cardiomegaly. Increasing interstitial and RIGHT alveolar airspace opacities, this could reflect pulmonary edema and/or pneumonia. Electronically Signed   By: Elon Alas M.D.   On: 05/11/2018 22:36   Dg Chest Port 1 View  Result Date: 05/11/2018 CLINICAL DATA:  Intubation. EXAM: PORTABLE CHEST 1 VIEW COMPARISON:  Chest x-ray dated October 15, 2017. FINDINGS: Interval placement of an endotracheal tube with the tip approximately 1 cm above the carina. Cardiomegaly. Bilateral perihilar hazy opacities. No focal consolidation, pleural effusion, or pneumothorax. Gaseous distention of the stomach. No acute osseous abnormality. IMPRESSION:  1. Endotracheal tube tip 1 cm above the carina. Consider retraction 2-3 cm. 2. Cardiomegaly with hazy perihilar opacities, suspicious for mild pulmonary edema. Electronically Signed   By: Titus Dubin M.D.   On: 05/11/2018 18:26    Assessment & Plan    62 year old female status post respiratory arrest post AV fistula revision.  On hypothermia protocol.  Minimal troponin elevation.  Event appear to be respiratory arrest.  EKG showed no ischemic changes.  Patient developed bradycardia but never lost pulse during hypoxic event.  We will continue to follow with rewarming.  When stable further noninvasive versus invasive cardiac evaluation may be considered.  Signed, Javier Docker Demontrez Rindfleisch MD 05/14/2018, 8:59 AM  Pager: [phone]

## 2018-05-14 NOTE — Progress Notes (Signed)
Pharmacy Antibiotic Note  Tracy Jennings is a 62 y.o. female admitted on 05/11/2018.  Pharmacy has been consulted for Cefepime dosing for aspiration.  Plan: Cefepime 1 g IV every 24 hours.  Patient borderline for adjustment. Possibility of Hemodialysis.  Follow-up renal fxn/status  Height: 5' 3.5" (161.3 cm) Weight: 194 lb 3.6 oz (88.1 kg) IBW/kg (Calculated) : 53.55  Temp (24hrs), Avg:98.8 F (37.1 C), Min:98.1 F (36.7 C), Max:99.9 F (37.7 C)  Recent Labs  Lab 05/11/18 1922  05/12/18 0225  05/12/18 1310  05/12/18 1605  05/13/18 0450  05/13/18 1518 05/13/18 1849 05/13/18 2206 05/14/18 0323 05/14/18 0755 05/14/18 1154  WBC 12.3*  --  13.1*  --   --   --   --   --  5.7  --   --   --   --  7.2  --   --   CREATININE 5.73*   < > 5.76*   < >  --    < >  --    < > 5.58*   < > 5.69* 5.63* 5.45* 5.97* 5.68* 5.93*  LATICACIDVEN  --   --   --   --  1.1  --  1.2  --   --   --  0.9 0.6  --   --   --   --    < > = values in this interval not displayed.    Estimated Creatinine Clearance: 10.7 mL/min (A) (by C-G formula based on SCr of 5.93 mg/dL (H)).    Allergies  Allergen Reactions  . Hctz [Hydrochlorothiazide]     pancreatitis  . Amlodipine Swelling    Knees down to ankles  . Amlodipine Besylate Swelling  . Sulfa Antibiotics Rash  . Dairy Aid [Lactase]     Runny nose    Antimicrobials this admission: Cefazolin 8/30 >>8/31  Metronidazole 9/1 >> Cefepime 9/1 >>   Dose adjustments this admission:   Microbiology results: 8/30 MRSA PCR: negative  Thank you for allowing pharmacy to be a part of this patient's care.  Erbie Arment A, [place] 05/14/2018 1:00 PM

## 2018-05-14 NOTE — Progress Notes (Signed)
[place] NOTE   Pharmacy Consult for Electrolytes    Labs: Recent Labs    05/11/18 1922 05/11/18 2105  05/12/18 0225 05/12/18 0356  05/13/18 0450  05/13/18 1849 05/13/18 2206 05/14/18 0323  WBC 12.3*  --   --  13.1*  --   --  5.7  --   --   --  7.2  HGB 9.0*  --   --  8.7*  --   --  7.8*  --   --   --  8.6*  HCT 27.5*  --   --  26.4*  --   --  23.1*  --   --   --  25.1*  PLT 207  --   --  143*  --   --  174  --   --   --  170  APTT  --  27  --   --  29  --   --   --   --   --   --   CREATININE 5.73*  --    < > 5.76*  --    < > 5.58*   < > 5.63* 5.45* 5.97*  MG 1.7  --   --  1.9  --   --  1.8  --   --   --   --   PHOS 5.9*  --   --   --   --   --  4.2  --   --   --   --   ALBUMIN 2.8*  --   --   --   --   --  2.4*  --   --   --   --   PROT 5.8*  --   --   --   --   --  4.7*  --   --   --   --   AST 39  --   --   --   --   --  19  --   --   --   --   ALT 32  --   --   --   --   --  11  --   --   --   --   ALKPHOS 42  --   --   --   --   --  37*  --   --   --   --   BILITOT 0.5  --   --   --   --   --  0.5  --   --   --   --    < > = values in this interval not displayed.   Potassium (mmol/L)  Date Value  05/14/2018 4.3  06/14/2014 3.6   Magnesium (mg/dL)  Date Value  05/13/2018 1.8  12/10/2013 1.8   Calcium (mg/dL)  Date Value  05/14/2018 7.5 (L)   Calcium, Total (mg/dL)  Date Value  06/14/2014 8.4 (L)   Albumin (g/dL)  Date Value  05/13/2018 2.4 (L)  06/12/2014 2.9 (L)   Phosphorus (mg/dL)  Date Value  05/13/2018 4.2  ]  Estimated Creatinine Clearance: 10.7 mL/min (A) (by C-G formula based on SCr of 5.97 mg/dL (H)).   Assessment: Pharmacy consulted for electrolyte monitoring and replacement in 62 yo female with PMH of ESRD. Patient is S/P Cardiac arrest in OR. Induced hypothermia 8/31.   Rewarming process started @ 2300 on 8/31. Expect K+ to increase. Labs ordered every 4 hours while  rewarming.   Goal of Therapy:  Electrolytes  WNL  Plan:  9/2 0323  K 4.3.  No additional replacement needed at this time. Labs ordered every 4 hours. Pharmacy will continue to monitor and replace electrolytes as needed.   Noralee Space, PharmD, BCPS Clinical Pharmacist 05/14/2018 8:41 AM

## 2018-05-14 NOTE — Progress Notes (Signed)
Treated for hypertension 202/81.

## 2018-05-14 NOTE — Consult Note (Signed)
HPI: Tracy Jennings is an 62 y.o. female with multiple medical problems s/p respiratory failure post AV fistula revision on 8/31 and possibly brief cardiac arrest.  Currently on sedation.    Past Medical History:  Diagnosis Date  . Anemia 04/2018   low iron. to be started on supplements  . CKD (chronic kidney disease)    Stage IV  . Diabetes mellitus without complication ([place])    type II  . ESRD (end stage renal disease) ([place])   . Heart murmur    followed as a child only  . Hyperlipidemia associated with type 2 diabetes mellitus ([place])   . Hypertension   . Pancreatitis   . Peripheral vascular disease [facility])     Past Surgical History:  Procedure Laterality Date  . AMPUTATION TOE Left 2013   2nd toe. tip of toe (toe nail was infected)  . AV FISTULA PLACEMENT Left 05/11/2018   Procedure: ARTERIOVENOUS (AV) FISTULA CREATION;  Surgeon: Katha Cabal, MD;  Location: [facility] ORS;  Service: Vascular;  Laterality: Left;  . CATARACT EXTRACTION    . CHOLECYSTECTOMY  2014  . COLONOSCOPY    . COLONOSCOPY WITH PROPOFOL N/A 01/10/2018   Procedure: COLONOSCOPY WITH PROPOFOL;  Surgeon: Toledo, Benay Pike, MD;  Location: [facility] ENDOSCOPY;  Service: Gastroenterology;  Laterality: N/A;  . EYE SURGERY Bilateral 2018   cataract extractions  . THROMBECTOMY W/ EMBOLECTOMY  05/11/2018   Procedure: THROMBECTOMY ARTERIOVENOUS FISTULA;  Surgeon: Katha Cabal, MD;  Location: [facility] ORS;  Service: Vascular;;  . TUBAL LIGATION  1984    Family History  Problem Relation Age of Onset  . Stroke Mother   . Hypertension Mother   . Cancer Father   . Diabetes Sister   . Diabetes Maternal Grandmother   . Diabetes Maternal Grandfather   . Diabetes Paternal Grandmother   . Diabetes Paternal Grandfather     Social History:  reports that she has never smoked. She has never used smokeless tobacco. She reports that she does not drink alcohol or use drugs.  Allergies  Allergen Reactions  . Hctz  [Hydrochlorothiazide]     pancreatitis  . Amlodipine Swelling    Knees down to ankles  . Amlodipine Besylate Swelling  . Sulfa Antibiotics Rash  . Dairy Aid [Lactase]     Runny nose    Medications: I have reviewed the patient's current medications.  ROS: Unable to obtain   Physical Examination: Blood pressure (!) 179/70, pulse 73, temperature 99.3 F (37.4 C), resp. rate 12, height 5' 3.5" (1.613 m), weight 88.1 kg, SpO2 100 %.  Not following commands When sedation is held she is seen moving her extremities symmetrically Pupils, corneal's present.    Laboratory Studies:   Basic Metabolic Panel: Recent Labs  Lab 05/11/18 1922  05/12/18 0225  05/13/18 0450  05/13/18 1518 05/13/18 1849 05/13/18 2206 05/14/18 0323 05/14/18 0755  NA 139   < > 141   < > 143   < > 142 140 141 141 143  K 5.3*   < > 4.1   < > 3.4*   < > 4.5 4.6 4.3 4.3 4.2  CL 111   < > 109   < > 112*   < > 111 111 113* 112* 110  CO2 21*   < > 22   < > 21*   < > 23 21* 21* 22 21*  GLUCOSE 192*   < > 235*   < > 106*   < >  163* 199* 193* 170* 152*  BUN 59*   < > 61*   < > 62*   < > 61* 58* 59* 58* 59*  CREATININE 5.73*   < > 5.76*   < > 5.58*   < > 5.69* 5.63* 5.45* 5.97* 5.68*  CALCIUM 8.0*   < > 7.9*   < > 7.5*   < > 7.6* 7.6* 7.2* 7.5* 7.9*  MG 1.7  --  1.9  --  1.8  --   --   --   --   --   --   PHOS 5.9*  --   --   --  4.2  --   --   --   --   --   --    < > = values in this interval not displayed.    Liver Function Tests: Recent Labs  Lab 05/11/18 1922 05/13/18 0450  AST 39 19  ALT 32 11  ALKPHOS 42 37*  BILITOT 0.5 0.5  PROT 5.8* 4.7*  ALBUMIN 2.8* 2.4*   No results for input(s): LIPASE, AMYLASE in the last 168 hours. No results for input(s): AMMONIA in the last 168 hours.  CBC: Recent Labs  Lab 05/11/18 1922 05/12/18 0225 05/13/18 0450 05/14/18 0323  WBC 12.3* 13.1* 5.7 7.2  NEUTROABS 11.2*  --  3.7 5.2  HGB 9.0* 8.7* 7.8* 8.6*  HCT 27.5* 26.4* 23.1* 25.1*  MCV 90.5 89.7 87.8  90.1  PLT 207 143* 174 170    Cardiac Enzymes: Recent Labs  Lab 05/11/18 1922 05/11/18 2105 05/12/18 0225 05/12/18 0818 05/12/18 1413  TROPONINI 0.03* 0.05* 0.08* 0.07* 0.06*    BNP: Invalid input(s): POCBNP  CBG: Recent Labs  Lab 05/13/18 1256 05/13/18 1434 05/13/18 1536 05/13/18 2053 05/14/18 0336  GLUCAP 125* 128* 141* 188* 139*    Microbiology: Results for orders placed or performed during the hospital encounter of 05/11/18  MRSA PCR Screening     Status: None   Collection Time: 05/11/18  7:56 PM  Result Value Ref Range Status   MRSA by PCR NEGATIVE NEGATIVE Final    Comment:        The GeneXpert MRSA Assay (FDA approved for NASAL specimens only), is one component of a comprehensive MRSA colonization surveillance program. It is not intended to diagnose MRSA infection nor to guide or monitor treatment for MRSA infections. Performed at [facility], [address] [place], [place] [address]   CULTURE, BLOOD (ROUTINE X 2) w Reflex to ID Panel     Status: None (Preliminary result)   Collection Time: 05/13/18  3:18 PM  Result Value Ref Range Status   Specimen Description BLOOD BFOA  Final   Special Requests   Final    BOTTLES DRAWN AEROBIC AND ANAEROBIC Blood Culture adequate volume   Culture   Final    NO GROWTH < 24 HOURS Performed at [facility], [address] [place], [place] [address]    Report Status PENDING  Incomplete  CULTURE, BLOOD (ROUTINE X 2) w Reflex to ID Panel     Status: None (Preliminary result)   Collection Time: 05/13/18  4:33 PM  Result Value Ref Range Status   Specimen Description BLOOD L HAND  Final   Special Requests   Final    BOTTLES DRAWN AEROBIC AND ANAEROBIC Blood Culture adequate volume   Culture   Final    NO GROWTH < 24 HOURS Performed at [facility], [place],  [place] [address]    Report Status PENDING  Incomplete    Coagulation Studies: Recent Labs     05/11/18 2105 05/12/18 0356  LABPROT 13.8 13.5  INR 1.07 1.04    Urinalysis: No results for input(s): COLORURINE, LABSPEC, PHURINE, GLUCOSEU, HGBUR, BILIRUBINUR, KETONESUR, PROTEINUR, UROBILINOGEN, NITRITE, LEUKOCYTESUR in the last 168 hours.  Invalid input(s): APPERANCEUR  Lipid Panel:     Component Value Date/Time   CHOL 122 12/11/2013 0405   TRIG 104 12/11/2013 0405   HDL 43 12/11/2013 0405   VLDL 21 12/11/2013 0405   LDLCALC 58 12/11/2013 0405    HgbA1C:  Lab Results  Component Value Date   HGBA1C 11.2 (H) 06/13/2014    Urine Drug Screen:  No results found for: LABOPIA, COCAINSCRNUR, LABBENZ, AMPHETMU, THCU, LABBARB  Alcohol Level: No results for input(s): ETH in the last 168 hours.   Imaging: Dg Chest Port 1 View  Result Date: 05/14/2018 CLINICAL DATA:  CHF, intubated EXAM: PORTABLE CHEST 1 VIEW COMPARISON:  05/12/2018 FINDINGS: Stable support apparatus. Low lung volumes persist with worsening left lower lobe collapse/consolidation obscuring the hemidiaphragm. Interval improvement in the right upper lobe patchy airspace disease. No enlarging effusion or pneumothorax. Trachea is midline. Monitor leads overlie the chest. Remote cholecystectomy. IMPRESSION: Improving right upper lobe aeration Worsening left lower lobe collapse/consolidation Electronically Signed   By: Jerilynn Mages.  Shick M.D.   On: 05/14/2018 08:40   Dg Chest Port 1 View  Result Date: 05/12/2018 CLINICAL DATA:  Intubated patient.  Hospitalized for cardiac arrest. EXAM: PORTABLE CHEST 1 VIEW COMPARISON:  05/11/2018 and older exams. FINDINGS: Endotracheal tube projects 3.2 cm above the Carina. Nasal/orogastric tube passes below the diaphragm well into the stomach. There is patchy airspace opacity in the right upper lobe. This is similar to the previous day's study allowing for differences in technique. Remainder of the lungs is clear with no convincing pulmonary edema. No visible pleural effusion. No pneumothorax. Heart is  normal in size and configuration. No mediastinal or hilar masses. IMPRESSION: 1. Endotracheal tube and nasal/orogastric tube are well positioned. 2. Patchy airspace opacity in the right upper lobe. Consider pneumonia or aspiration pneumonitis. There is no evidence of pulmonary edema. 3. No pneumothorax. Electronically Signed   By: Lajean Manes M.D.   On: 05/12/2018 11:50     Assessment/Plan:   62 y.o. female with multiple medical problems s/p respiratory failure post AV fistula revision on 8/31 and possibly brief cardiac arrest.  Currently on sedation. Initial CTH done on presentation without any acute abnormalities  - Pt is currently being rewarmed.  - On precedex and fentanyl - She is moving her extremities with precedex is held symmetrically - Would hold off further imaging at this time and titrate of fentanyl/precedex when rewarmed to look for exam - If unable to extubate or no improvement then would need to have repeat imaging  Tracy Jennings  05/14/2018, 11:30 AM

## 2018-05-14 NOTE — Progress Notes (Signed)
Tracy Jennings NOTE   Pharmacy Consult for Electrolytes    Labs: Recent Labs    05/12/18 0225 05/12/18 0356  05/13/18 0450  05/14/18 0323  05/14/18 1154 05/14/18 1558 05/14/18 1912 05/14/18 2000  WBC 13.1*  --   --  5.7  --  7.2  --   --   --   --   --   HGB 8.7*  --   --  7.8*  --  8.6*  --   --   --   --   --   HCT 26.4*  --   --  23.1*  --  25.1*  --   --   --   --   --   PLT 143*  --   --  174  --  170  --   --   --   --   --   APTT  --  29  --   --   --   --   --   --   --   --   --   CREATININE 5.76*  --    < > 5.58*   < > 5.97*   < > 5.93* 5.86* 4.54*  --   MG 1.9  --   --  1.8  --   --   --   --   --   --   --   PHOS  --   --   --  4.2  --   --   --   --   --   --   --   ALBUMIN  --   --   --  2.4*  --   --   --   --   --   --  1.7*  PROT  --   --   --  4.7*  --   --   --   --   --   --   --   AST  --   --   --  19  --   --   --   --   --   --   --   ALT  --   --   --  11  --   --   --   --   --   --   --   ALKPHOS  --   --   --  37*  --   --   --   --   --   --   --   BILITOT  --   --   --  0.5  --   --   --   --   --   --   --    < > = values in this interval not displayed.   Potassium (mmol/L)  Date Value  05/14/2018 3.2 (L)  06/14/2014 3.6   Magnesium (mg/dL)  Date Value  05/13/2018 1.8  12/10/2013 1.8   Calcium (mg/dL)  Date Value  05/14/2018 5.9 (LL)   Calcium, Total (mg/dL)  Date Value  06/14/2014 8.4 (L)   Albumin (g/dL)  Date Value  05/14/2018 1.7 (L)  06/12/2014 2.9 (L)   Phosphorus (mg/dL)  Date Value  05/13/2018 4.2  ]  Estimated Creatinine Clearance: 14 mL/min (A) (by C-G formula based on SCr of 4.54 mg/dL (H)).   Assessment: Pharmacy consulted for electrolyte monitoring and replacement in 62 yo female with PMH of ESRD. Patient is S/P Cardiac  arrest in OR. Induced hypothermia 8/31.   Rewarming process started @ 2300 on 8/31. Expect K+ to increase. Labs ordered every 4 hours while rewarming.   Goal of Therapy:   Electrolytes WNL  Plan:  9/2 15:58  K 4.4.  No additional replacement needed. 19:12 K 3.2, will order KCl 45mEq PO times one dose. Labs ordered every 4 hours. Pharmacy will continue to monitor and replace electrolytes as needed.   Paulina Fusi, PharmD, BCPS 05/14/2018 9:31 PM

## 2018-05-14 NOTE — Progress Notes (Signed)
Spoke to husband, who is at the bedside.  Patient scheduled for extubation tomorrow.  He understands the fistula has occluded again.  Permanent access plans will be determined based on the patient's clinical status.  Annamarie Major

## 2018-05-15 ENCOUNTER — Inpatient Hospital Stay: Payer: Managed Care, Other (non HMO)

## 2018-05-15 DIAGNOSIS — J9601 Acute respiratory failure with hypoxia: Secondary | ICD-10-CM

## 2018-05-15 DIAGNOSIS — R092 Respiratory arrest: Secondary | ICD-10-CM

## 2018-05-15 LAB — BLOOD GAS, ARTERIAL
Acid-base deficit: 5.7 mmol/L — ABNORMAL HIGH (ref 0.0–2.0)
Bicarbonate: 19.3 mmol/L — ABNORMAL LOW (ref 20.0–28.0)
FIO2: 0.28
MECHVT: 500 mL
O2 Saturation: 99.1 %
PEEP: 5 cmH2O
Patient temperature: 37
RATE: 12 resp/min
pCO2 arterial: 35 mmHg (ref 32.0–48.0)
pH, Arterial: 7.35 (ref 7.350–7.450)
pO2, Arterial: 141 mmHg — ABNORMAL HIGH (ref 83.0–108.0)

## 2018-05-15 LAB — BASIC METABOLIC PANEL
Anion gap: 3 — ABNORMAL LOW (ref 5–15)
BUN: 48 mg/dL — ABNORMAL HIGH (ref 6–20)
CO2: 18 mmol/L — ABNORMAL LOW (ref 22–32)
Calcium: 6.1 mg/dL — CL (ref 8.9–10.3)
Chloride: 125 mmol/L — ABNORMAL HIGH (ref 98–111)
Creatinine, Ser: 4.37 mg/dL — ABNORMAL HIGH (ref 0.44–1.00)
GFR calc Af Amer: 12 mL/min — ABNORMAL LOW (ref >60)
GFR calc non Af Amer: 10 mL/min — ABNORMAL LOW (ref >60)
Glucose, Bld: 151 mg/dL — ABNORMAL HIGH (ref 70–99)
Potassium: 3.2 mmol/L — ABNORMAL LOW (ref 3.5–5.1)
Sodium: 146 mmol/L — ABNORMAL HIGH (ref 135–145)

## 2018-05-15 LAB — GLUCOSE, CAPILLARY
Glucose-Capillary: 181 mg/dL — ABNORMAL HIGH (ref 70–99)
Glucose-Capillary: 156 mg/dL — ABNORMAL HIGH (ref 70–99)
Glucose-Capillary: 165 mg/dL — ABNORMAL HIGH (ref 70–99)
Glucose-Capillary: 175 mg/dL — ABNORMAL HIGH (ref 70–99)

## 2018-05-15 LAB — CALCIUM, IONIZED
Calcium, Ionized, Serum: 4.4 mg/dL — ABNORMAL LOW (ref 4.5–5.6)
Calcium, Ionized, Serum: 4.7 mg/dL (ref 4.5–5.6)

## 2018-05-15 LAB — POTASSIUM: Potassium: 4.5 mmol/L (ref 3.5–5.1)

## 2018-05-15 MED ORDER — TUBERCULIN PPD 5 UNIT/0.1ML ID SOLN
5.0000 [IU] | Freq: Once | INTRADERMAL | Status: AC
  Administered 2018-05-15: 5 [IU] via INTRADERMAL
  Filled 2018-05-15: qty 0.1

## 2018-05-15 MED ORDER — FUROSEMIDE 10 MG/ML IJ SOLN
40.0000 mg | Freq: Once | INTRAMUSCULAR | Status: AC
  Administered 2018-05-15: 40 mg via INTRAVENOUS
  Filled 2018-05-15: qty 4

## 2018-05-15 MED ORDER — HYDRALAZINE HCL 20 MG/ML IJ SOLN
10.0000 mg | INTRAMUSCULAR | Status: DC | PRN
  Administered 2018-05-15 (×2): 5 mg via INTRAVENOUS
  Administered 2018-05-15: 10 mg via INTRAVENOUS
  Administered 2018-05-16 – 2018-05-18 (×3): 20 mg via INTRAVENOUS
  Filled 2018-05-15 (×7): qty 1

## 2018-05-15 MED ORDER — FAMOTIDINE 20 MG PO TABS
20.0000 mg | ORAL_TABLET | Freq: Every day | ORAL | Status: DC
  Administered 2018-05-16 – 2018-05-24 (×8): 20 mg
  Filled 2018-05-15 (×9): qty 1

## 2018-05-15 MED ORDER — POTASSIUM CHLORIDE 10 MEQ/100ML IV SOLN
10.0000 meq | INTRAVENOUS | Status: AC
  Administered 2018-05-15: 10 meq via INTRAVENOUS
  Filled 2018-05-15 (×4): qty 100

## 2018-05-15 MED ORDER — POTASSIUM CHLORIDE 20 MEQ PO PACK
20.0000 meq | PACK | Freq: Once | ORAL | Status: DC
  Filled 2018-05-15: qty 1

## 2018-05-15 NOTE — Progress Notes (Signed)
IS education done with pt and family. Pt and family understands technique and reason for use. Pt inspire 1121ml.

## 2018-05-15 NOTE — Progress Notes (Signed)
Chaplain went to check on patient and she was sitting up in bed. Braulio Conte (husband) and son was by her bedside. Chaplain spoke with patient and told her it was good to see her up. Patient smile at [facility]. The son of patient talked with Chaplain about other family member and Chaplain offered prayer to him for family.

## 2018-05-15 NOTE — Progress Notes (Signed)
PHARMACIST - PHYSICIAN COMMUNICATION  CONCERNING: IV to Oral Route Change Policy  RECOMMENDATION: This patient is receiving famotidine by the intravenous route.  Based on criteria approved by the Pharmacy and Therapeutics Committee, the intravenous medication(s) is/are being converted to the equivalent oral dose form(s).   DESCRIPTION: These criteria include:  The patient is eating (either orally or via tube) and/or has been taking other orally administered medications for a least 24 hours  The patient has no evidence of active gastrointestinal bleeding or impaired GI absorption (gastrectomy, short bowel, patient on TNA or NPO).  If you have questions about this conversion, please contact the Pharmacy Department  []   [phone] )  [facility] [x]   [phone] )  [facility] []   [phone] )  [facility] []   [phone] )  [facility] []   [phone] )  [place], [facility] 05/15/2018 10:12 AM

## 2018-05-15 NOTE — Progress Notes (Signed)
Subjective: Patient improved today.  Remains intubated but sedation decreased.  Hypothermic as well.    Objective: Current vital signs: BP (!) 146/73   Pulse 69   Temp (!) 96.3 F (35.7 C)   Resp 13   Ht 5' 3.5" (1.613 m)   Wt 87.8 kg   SpO2 100%   BMI 33.75 kg/m  Vital signs in last 24 hours: Temp:  [94.5 F (34.7 C)-98.6 F (37 C)] 96.3 F (35.7 C) (09/03 1100) Pulse Rate:  [63-87] 69 (09/03 1100) Resp:  [10-24] 13 (09/03 1100) BP: (136-212)/(67-97) 146/73 (09/03 1100) SpO2:  [100 %] 100 % (09/03 0727) FiO2 (%):  [28 %] 28 % (09/03 0905) Weight:  [87.8 kg] 87.8 kg (09/03 0349)  Intake/Output from previous day: 09/02 0701 - 09/03 0700 In: 1986.2 [I.V.:1449.5; IV Piggyback:536.7] Out: 2030 [Urine:830; Emesis/NG output:1200] Intake/Output this shift: No intake/output data recorded. Nutritional status:  Diet Order            Diet NPO time specified  Diet effective now              Neurologic Exam: Mental Status: Alert.  Attempts to mouth words in response to questioning.  Follows simple commands.   Cranial Nerves: II: Does not blink to bilateral confrontation.  Does not fix with gaze III,IV, VI: Extra-ocular motions conjugate.  V,VII: Corneals intact VIII: hearing grossly normal bilaterally IX,X: unable to test XI: unable to test XII: unable to test Motor: Moves all extremities spontaneously    Lab Results: Basic Metabolic Panel: Recent Labs  Lab 05/11/18 1922  05/12/18 0225  05/13/18 0450  05/14/18 1154 05/14/18 1558 05/14/18 1912 05/14/18 2304 05/15/18 0300  NA 139   < > 141   < > 143   < > 143 143 145 144 146*  K 5.3*   < > 4.1   < > 3.4*   < > 4.3 4.4 3.2* 4.1 3.2*  CL 111   < > 109   < > 112*   < > 113* 112* 124* 116* 125*  CO2 21*   < > 22   < > 21*   < > 21* 21* 16* 21* 18*  GLUCOSE 192*   < > 235*   < > 106*   < > 188* 199* 154* 194* 151*  BUN 59*   < > 61*   < > 62*   < > 59* 60* 47* 59* 48*  CREATININE 5.73*   < > 5.76*   < > 5.58*    < > 5.93* 5.86* 4.54* 5.71* 4.37*  CALCIUM 8.0*   < > 7.9*   < > 7.5*   < > 7.8* 7.7* 5.9* 7.8* 6.1*  MG 1.7  --  1.9  --  1.8  --   --   --   --   --   --   PHOS 5.9*  --   --   --  4.2  --   --   --   --   --   --    < > = values in this interval not displayed.    Liver Function Tests: Recent Labs  Lab 05/11/18 1922 05/13/18 0450 05/14/18 2000  AST 39 19  --   ALT 32 11  --   ALKPHOS 42 37*  --   BILITOT 0.5 0.5  --   PROT 5.8* 4.7*  --   ALBUMIN 2.8* 2.4* 1.7*   No results for input(s): LIPASE, AMYLASE in  the last 168 hours. No results for input(s): AMMONIA in the last 168 hours.  CBC: Recent Labs  Lab 05/11/18 1922 05/12/18 0225 05/13/18 0450 05/14/18 0323  WBC 12.3* 13.1* 5.7 7.2  NEUTROABS 11.2*  --  3.7 5.2  HGB 9.0* 8.7* 7.8* 8.6*  HCT 27.5* 26.4* 23.1* 25.1*  MCV 90.5 89.7 87.8 90.1  PLT 207 143* 174 170    Cardiac Enzymes: Recent Labs  Lab 05/11/18 1922 05/11/18 2105 05/12/18 0225 05/12/18 0818 05/12/18 1413  TROPONINI 0.03* 0.05* 0.08* 0.07* 0.06*    Lipid Panel: No results for input(s): CHOL, TRIG, HDL, CHOLHDL, VLDL, LDLCALC in the last 168 hours.  CBG: Recent Labs  Lab 05/14/18 0336 05/14/18 1914 05/14/18 2327 05/15/18 0351 05/15/18 0758  GLUCAP 139* 38 166* 156* 165*    Microbiology: Results for orders placed or performed during the hospital encounter of 05/11/18  MRSA PCR Screening     Status: None   Collection Time: 05/11/18  7:56 PM  Result Value Ref Range Status   MRSA by PCR NEGATIVE NEGATIVE Final    Comment:        The GeneXpert MRSA Assay (FDA approved for NASAL specimens only), is one component of a comprehensive MRSA colonization surveillance program. It is not intended to diagnose MRSA infection nor to guide or monitor treatment for MRSA infections. Performed at [facility], [address] [place], [place] [address]   CULTURE, BLOOD (ROUTINE X 2) w Reflex to ID Panel     Status: None (Preliminary  result)   Collection Time: 05/13/18  3:18 PM  Result Value Ref Range Status   Specimen Description BLOOD BFOA  Final   Special Requests   Final    BOTTLES DRAWN AEROBIC AND ANAEROBIC Blood Culture adequate volume   Culture   Final    NO GROWTH 2 DAYS Performed at [facility], [address], [place], [place] [address]    Report Status PENDING  Incomplete  CULTURE, BLOOD (ROUTINE X 2) w Reflex to ID Panel     Status: None (Preliminary result)   Collection Time: 05/13/18  4:33 PM  Result Value Ref Range Status   Specimen Description BLOOD L HAND  Final   Special Requests   Final    BOTTLES DRAWN AEROBIC AND ANAEROBIC Blood Culture adequate volume   Culture   Final    NO GROWTH 2 DAYS Performed at [facility], [address] [place], [place] [address]    Report Status PENDING  Incomplete    Coagulation Studies: No results for input(s): LABPROT, INR in the last 72 hours.  Imaging: Dg Chest Port 1 View  Result Date: 05/15/2018 CLINICAL DATA:  Pneumonia. EXAM: PORTABLE CHEST 1 VIEW COMPARISON:  05/14/2018. FINDINGS: Endotracheal tube, NG tube in stable position. Persistent cardiomegaly. Progressive bibasilar and bibasilar infiltrates/edema. Tiny pleural effusions cannot be excluded. No pneumothorax. IMPRESSION: 1.  Lines and tubes in stable position. 2. Progressive bibasilar atelectasis. Progressive bibasilar infiltrates/edema. Small bilateral pleural effusions cannot be excluded. 3.  Stable cardiomegaly. Electronically Signed   By: Marcello Moores  Register   On: 05/15/2018 06:55   Dg Chest Port 1 View  Result Date: 05/14/2018 CLINICAL DATA:  CHF, intubated EXAM: PORTABLE CHEST 1 VIEW COMPARISON:  05/12/2018 FINDINGS: Stable support apparatus. Low lung volumes persist with worsening left lower lobe collapse/consolidation obscuring the hemidiaphragm. Interval improvement in the right upper lobe patchy airspace disease. No enlarging effusion or pneumothorax. Trachea is midline.  Monitor leads overlie the chest. Remote  cholecystectomy. IMPRESSION: Improving right upper lobe aeration Worsening left lower lobe collapse/consolidation Electronically Signed   By: Jerilynn Mages.  Shick M.D.   On: 05/14/2018 08:40    Medications:  I have reviewed the patient's current medications. Scheduled: . acetylcysteine  3 mL Nebulization BID  . artificial tears  1 application Both Eyes O1Y  . aspirin  81 mg Per Tube Daily  . atorvastatin  40 mg Oral q1800  . chlorhexidine gluconate (MEDLINE KIT)  15 mL Mouth Rinse BID  . clopidogrel  75 mg Oral Daily  . diltiazem  120 mg Oral Daily  . docusate  100 mg Per Tube BID  . famotidine  20 mg Per Tube QHS  . ipratropium-albuterol  3 mL Nebulization QID  . mouth rinse  15 mL Mouth Rinse 10 times per day  . potassium chloride  20 mEq Per Tube Once  . sodium chloride flush  10-40 mL Intracatheter Q12H    Assessment/Plan: Patient improved today.  Following commands.  Attempting to speak.  Remains on some sedation which may be affecting the remainder of the exam.  To be extubated soon likely.  Will continue to follow with you and do further imaging if patient does not continue to improve.     LOS: 4 days   Alexis Goodell, MD Neurology [phone] 05/15/2018  12:18 PM

## 2018-05-15 NOTE — Progress Notes (Signed)
Pt tolerating decrease in sedation well. Fentanyl turned off and precedex turned down to 0.4 mcg. Pt resting and hydralazine given for blood pressure. Will continue to monitor.

## 2018-05-15 NOTE — Progress Notes (Signed)
[place] for Electrolytes    Labs: Recent Labs    05/13/18 0450  05/14/18 0323  05/14/18 1912 05/14/18 2000 05/14/18 2304 05/15/18 0300  WBC 5.7  --  7.2  --   --   --   --   --   HGB 7.8*  --  8.6*  --   --   --   --   --   HCT 23.1*  --  25.1*  --   --   --   --   --   PLT 174  --  170  --   --   --   --   --   CREATININE 5.58*   < > 5.97*   < > 4.54*  --  5.71* 4.37*  MG 1.8  --   --   --   --   --   --   --   PHOS 4.2  --   --   --   --   --   --   --   ALBUMIN 2.4*  --   --   --   --  1.7*  --   --   PROT 4.7*  --   --   --   --   --   --   --   AST 19  --   --   --   --   --   --   --   ALT 11  --   --   --   --   --   --   --   ALKPHOS 37*  --   --   --   --   --   --   --   BILITOT 0.5  --   --   --   --   --   --   --    < > = values in this interval not displayed.   Potassium (mmol/L)  Date Value  05/15/2018 3.2 (L)  06/14/2014 3.6   Magnesium (mg/dL)  Date Value  05/13/2018 1.8  12/10/2013 1.8   Calcium (mg/dL)  Date Value  05/15/2018 6.1 (LL)   Calcium, Total (mg/dL)  Date Value  06/14/2014 8.4 (L)   Albumin (g/dL)  Date Value  05/14/2018 1.7 (L)  06/12/2014 2.9 (L)   Phosphorus (mg/dL)  Date Value  05/13/2018 4.2  ]  Estimated Creatinine Clearance: 14.5 mL/min (A) (by C-G formula based on SCr of 4.37 mg/dL (H)).   Assessment: Pharmacy consulted for electrolyte monitoring and replacement in 62 yo female with PMH of ESRD. Patient is S/P Cardiac arrest in OR. Induced hypothermia 8/31.   Rewarming process started @ 2300 on 8/31. Expect K+ to increase. Labs ordered every 4 hours while rewarming.   Goal of Therapy:  Electrolytes WNL  Plan:  9/2 15:58  K 4.4.  No additional replacement needed. 19:12 K 3.2, will order KCl 52mEq PO times one dose. Labs ordered every 4 hours. Pharmacy will continue to monitor and replace electrolytes as needed.    9/3 0300 K+ 3.2. 20 mEq KCl per tube x1 ordered.  Replacing cautiously due to ESRD.   Sim Boast, PharmD, BCPS  05/15/18 4:28 AM

## 2018-05-15 NOTE — Progress Notes (Signed)
Pt was suctioned prior to extubation for no secretions. Per Dr. Artist [place] order, she was extubated without incident. She is voicing and is without stridor. She was place on a 2 L nasal cannula with a Sa02 of 100%.

## 2018-05-15 NOTE — Progress Notes (Signed)
Chaplain seen patient family in waiting room in ICU. Chaplain ask how were they and family share the change in patient. Chaplain was assisting another family. Chaplain told family she would check on them shortly. Family was appreciative for Chaplain concern for the patient.

## 2018-05-15 NOTE — Progress Notes (Signed)
Pharmacy Electrolyte Monitoring Consult:  Pharmacy consulted to assist in monitoring and replacing electrolytes in this 62 y.o. female admitted on 05/11/2018 with cardiac arrest in the OR. S/P rewarming.   Labs:  Sodium (mmol/L)  Date Value  05/15/2018 146 (H)  06/14/2014 139   Potassium (mmol/L)  Date Value  05/15/2018 4.5  06/14/2014 3.6   Magnesium (mg/dL)  Date Value  05/13/2018 1.8  12/10/2013 1.8   Phosphorus (mg/dL)  Date Value  05/13/2018 4.2   Calcium (mg/dL)  Date Value  05/15/2018 6.1 (LL)   Calcium, Total (mg/dL)  Date Value  06/14/2014 8.4 (L)   Albumin (g/dL)  Date Value  05/14/2018 1.7 (L)  06/12/2014 2.9 (L)    Assessment/Plan: Patient received potassium 38mEq IV x 1 this am. Potassium afternoon check is 4.5; up from 3.2. No further replacement warranted.   Will check electrolytes with am labs.   Pharmacy will continue to monitor and adjust per consult.   Koral Thaden L 05/15/2018 4:59 PM

## 2018-05-15 NOTE — Progress Notes (Signed)
Name: Tracy Jennings MRN: [id number] DOB: [birth year]     CONSULTATION DATE: 05/11/2018 Subjective & objective: Improved mental status, awake following commands on light sedation.  PAST MEDICAL HISTORY :   has a past medical history of Anemia (04/2018), CKD (chronic kidney disease), Diabetes mellitus without complication ([place]), ESRD (end stage renal disease) ([place]), Heart murmur, Hyperlipidemia associated with type 2 diabetes mellitus ([place]), Hypertension, Pancreatitis, and Peripheral vascular disease ([place]).  has a past surgical history that includes Cataract extraction; Amputation toe (Left, 2013); Colonoscopy; Colonoscopy with propofol (N/A, 01/10/2018); Eye surgery (Bilateral, 2018); Cholecystectomy (2014); Tubal ligation (1984); AV fistula placement (Left, 05/11/2018); and Thrombectomy w/ embolectomy (05/11/2018). Prior to Admission medications   Medication Sig Start Date End Date Taking? Authorizing Provider  atorvastatin (LIPITOR) 40 MG tablet Take 40 mg by mouth daily at 6 PM.  10/05/15 05/11/18 Yes [provider]  bumetanide (BUMEX) 1 MG tablet Take 2 mg by mouth 2 (two) times daily.  02/28/18 02/28/19 Yes [provider]  carvedilol (COREG CR) 40 MG 24 hr capsule Take 40 mg by mouth daily. Takes in the morning when she gets home from work 11/06/17  Yes [provider]  diltiazem (CARDIZEM LA) 120 MG 24 hr tablet Take 1 tablet (120 mg total) by mouth daily. Patient taking differently: Take 120 mg by mouth daily. Takes in the morning when she gets home from work 08/08/17  Yes Cuthriell, Charline Bills, PA-C  fluticasone (FLONASE) 50 MCG/ACT nasal spray 2 sprays by Each Nare route daily. 01/15/18 01/15/19 Yes [provider]  hydrALAZINE (APRESOLINE) 50 MG tablet Take 50 mg by mouth every 8 (eight) hours.  10/24/17  Yes [provider]  Insulin Glargine 300 UNIT/ML SOPN Inject 40 Units into the skin daily. In the morning.  (toujeo) 11/30/15  Yes [provider]  amLODipine (NORVASC) 5 MG tablet Take 1 tablet by mouth once daily 01/18/16   [provider]  aspirin EC 81 MG tablet Take 81 mg by mouth daily. 09/27/17   [provider]  HYDROcodone-acetaminophen (NORCO) 5-325 MG tablet Take 1-2 tablets by mouth every 6 (six) hours as needed for moderate pain or severe pain. 05/11/18   Schnier, Dolores Lory, MD  insulin aspart (NOVOLOG) 100 UNIT/ML FlexPen Inject 10-12 Units into the skin 3 (three) times daily with meals.  12/28/15   [provider]  losartan (COZAAR) 100 MG tablet Take 1 tablet by mouth once daily 01/18/16   [provider]  metoprolol (LOPRESSOR) 50 MG tablet Take 50 mg by mouth daily.  02/20/15   [provider]  ondansetron (ZOFRAN) 4 MG tablet Take 1 tablet (4 mg total) by mouth every 6 (six) hours as needed for nausea. Patient not taking: Reported on 05/04/2018 11/03/17   Epifanio Lesches, MD  oxyCODONE (OXY IR/ROXICODONE) 5 MG immediate release tablet Take 1 tablet (5 mg total) by mouth every 4 (four) hours as needed for moderate pain. Patient not taking: Reported on 05/04/2018 11/03/17   Epifanio Lesches, MD  spironolactone (ALDACTONE) 50 MG tablet Take 50 mg by mouth daily. 09/27/17   [provider]  Vitamin D, Ergocalciferol, (DRISDOL) 50000 units CAPS capsule TAKE ONE CAPSULE BY MOUTH ONE TIME PER WEEK 01/06/18   [provider]   Allergies  Allergen Reactions  . Hctz [Hydrochlorothiazide]     pancreatitis  . Amlodipine Swelling    Knees down to ankles  . Amlodipine Besylate Swelling  . Sulfa Antibiotics Rash  . Dairy Aid [  Lactase]     Runny nose    FAMILY HISTORY:  family history includes Cancer in her father; Diabetes in her maternal grandfather, maternal grandmother, paternal grandfather, paternal grandmother, and sister; Hypertension in her mother; Stroke in her mother. SOCIAL HISTORY:  reports that she has never smoked. She has never used smokeless  tobacco. She reports that she does not drink alcohol or use drugs.  REVIEW OF SYSTEMS:   Unable to obtain due to critical illness   VITAL SIGNS: Temp:  [94.5 F (34.7 C)-97.3 F (36.3 C)] 97.3 F (36.3 C) (09/03 1600) Pulse Rate:  [63-102] 102 (09/03 1600) Resp:  [8-17] 8 (09/03 1600) BP: (136-212)/(65-97) 153/77 (09/03 1600) SpO2:  [96 %-100 %] 97 % (09/03 1500) FiO2 (%):  [28 %] 28 % (09/03 0905) Weight:  [87.8 kg] 87.8 kg (09/03 0349)  Physical Examination: Light sedation awake and following commands. S/p Induced hypothermia protocol, moves extremities and not following commands. Detailed neuro exam as per neurology On vent, no distress, bilateral equal air entry and no adventitious sounds S1 & S2 are audible with no murmur Benign abdominal exam with feeble peristalsis Dress left arm newly created AV fistula, no thrill palpable No edema   ASSESSMENT / PLAN:  Acute respiratory failure.  Status post hypothermia protocol. -SAT + SBT and follow with weaning parameters.  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  Status post induced hypothermia. -Monitor neuro status and management as per neurology  Cardiac arrest in OR after refashioning of left arm AV fistula created for dialysis. ECHO LVEF 38-10%, diastolic dysfunction grade I. Elevated Troponin, no ischemic changes on EKG.ASA + Plavix -Status post induced hypothermia -Follow with cardiology for further work-up and recommendation. -Hemodynamic monitoring.  Atelectasis and pneumonia with possible aspiration s/p cardiac arrest.  Improved bibasilar airspace disease.  MRSA PCR negative -Empiric Cefep + Flagyl. Monitor CXR + CBC + FIO2. -Gentle diuresis to improve lung compliance  CHF with pulmonary congestion on chest x-ray post  intubation -Diuresis/RRT as per renal to improve lung compliance.  Hypotension with meds / sepsis (improved) -Monitor hemodynamics, procal., Lactic acid (0.6) and optimize meds.  HTN -Optimize antihypertensives and monitor hemodynamics.  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 S/p placement of Lt. Arm AVF.  Needs dialysis access -Management as per renal. Not on RRT.  Left arm AV fistulawith questionable malfunctioning -Management as per vascular  Diabetes mellitus -Glycemic control  Dyslipidemia -Statin  Anemia -Hemoglobin more than7g/dL  Thrombocytopenia -Monitor Plat  Full code  DVT and GI prophylaxis. Continue with supportive care  Critical care time 35 minutes

## 2018-05-15 NOTE — Care Management (Signed)
PPD requested for outpatient hemodialysis placement; Estill Bamberg with [facility] updated.

## 2018-05-15 NOTE — Progress Notes (Signed)
[facility]  ROUNDING NOTE   Subjective:   Doing fair Remains intubated. Able to follow simple commands Husband at bedside    Objective:  Vital signs in last 24 hours:  Temp:  [94.5 F (34.7 C)-99.3 F (37.4 C)] 95.4 F (35.2 C) (09/03 0847) Pulse Rate:  [63-87] 83 (09/03 0847) Resp:  [10-24] 12 (09/03 0847) BP: (136-212)/(67-97) 173/77 (09/03 0847) SpO2:  [100 %] 100 % (09/03 0727) FiO2 (%):  [28 %] 28 % (09/03 0727) Weight:  [87.8 kg] 87.8 kg (09/03 0349)  Weight change: 7.4 kg Filed Weights   05/13/18 1600 05/14/18 0410 05/15/18 0349  Weight: 87.7 kg 88.1 kg 87.8 kg    Intake/Output: I/O last 3 completed shifts: In: 2771.6 [I.V.:2084.9; IV Piggyback:686.7] Out: 3825 [Urine:1280; Emesis/NG KNLZJQ:7341]   Intake/Output this shift:  No intake/output data recorded.  Physical Exam: General: Critically ill  Head: ETT OGT  Eyes: Anicteric   Neck: Supple,    Lungs:  Coarse at bases, vent assisted FiO 30%  Heart: Regular rate and rhythm  Abdomen:  Soft, nontender,   Extremities: + dependent peripheral edema.  Neurologic:  able to follow simple cammands  Skin: No lesions  Access: Left AVF - no thrill or bruit appreciated.     Basic Metabolic Panel: Recent Labs  Lab 05/11/18 1922  05/12/18 0225  05/13/18 0450  05/14/18 1154 05/14/18 1558 05/14/18 1912 05/14/18 2304 05/15/18 0300  NA 139   < > 141   < > 143   < > 143 143 145 144 146*  K 5.3*   < > 4.1   < > 3.4*   < > 4.3 4.4 3.2* 4.1 3.2*  CL 111   < > 109   < > 112*   < > 113* 112* 124* 116* 125*  CO2 21*   < > 22   < > 21*   < > 21* 21* 16* 21* 18*  GLUCOSE 192*   < > 235*   < > 106*   < > 188* 199* 154* 194* 151*  BUN 59*   < > 61*   < > 62*   < > 59* 60* 47* 59* 48*  CREATININE 5.73*   < > 5.76*   < > 5.58*   < > 5.93* 5.86* 4.54* 5.71* 4.37*  CALCIUM 8.0*   < > 7.9*   < > 7.5*   < > 7.8* 7.7* 5.9* 7.8* 6.1*  MG 1.7  --  1.9  --  1.8  --   --   --   --   --   --   PHOS 5.9*  --   --    --  4.2  --   --   --   --   --   --    < > = values in this interval not displayed.    Liver Function Tests: Recent Labs  Lab 05/11/18 1922 05/13/18 0450 05/14/18 2000  AST 39 19  --   ALT 32 11  --   ALKPHOS 42 37*  --   BILITOT 0.5 0.5  --   PROT 5.8* 4.7*  --   ALBUMIN 2.8* 2.4* 1.7*   No results for input(s): LIPASE, AMYLASE in the last 168 hours. No results for input(s): AMMONIA in the last 168 hours.  CBC: Recent Labs  Lab 05/11/18 1922 05/12/18 0225 05/13/18 0450 05/14/18 0323  WBC 12.3* 13.1* 5.7 7.2  NEUTROABS 11.2*  --  3.7 5.2  HGB 9.0* 8.7* 7.8* 8.6*  HCT 27.5* 26.4* 23.1* 25.1*  MCV 90.5 89.7 87.8 90.1  PLT 207 143* 174 170    Cardiac Enzymes: Recent Labs  Lab 05/11/18 1922 05/11/18 2105 05/12/18 0225 05/12/18 0818 05/12/18 1413  TROPONINI 0.03* 0.05* 0.08* 0.07* 0.06*    BNP: Invalid input(s): POCBNP  CBG: Recent Labs  Lab 05/14/18 0336 05/14/18 1914 05/14/18 2327 05/15/18 0351 05/15/18 0758  GLUCAP 139* 41 166* 156* 165*    Microbiology: Results for orders placed or performed during the hospital encounter of 05/11/18  MRSA PCR Screening     Status: None   Collection Time: 05/11/18  7:56 PM  Result Value Ref Range Status   MRSA by PCR NEGATIVE NEGATIVE Final    Comment:        The GeneXpert MRSA Assay (FDA approved for NASAL specimens only), is one component of a comprehensive MRSA colonization surveillance program. It is not intended to diagnose MRSA infection nor to guide or monitor treatment for MRSA infections. Performed at [facility], [address] [place], [place] [address]   CULTURE, BLOOD (ROUTINE X 2) w Reflex to ID Panel     Status: None (Preliminary result)   Collection Time: 05/13/18  3:18 PM  Result Value Ref Range Status   Specimen Description BLOOD BFOA  Final   Special Requests   Final    BOTTLES DRAWN AEROBIC AND ANAEROBIC Blood Culture adequate volume   Culture   Final    NO GROWTH 2  DAYS Performed at [facility], [address] [place], [place] [address]    Report Status PENDING  Incomplete  CULTURE, BLOOD (ROUTINE X 2) w Reflex to ID Panel     Status: None (Preliminary result)   Collection Time: 05/13/18  4:33 PM  Result Value Ref Range Status   Specimen Description BLOOD L HAND  Final   Special Requests   Final    BOTTLES DRAWN AEROBIC AND ANAEROBIC Blood Culture adequate volume   Culture   Final    NO GROWTH 2 DAYS Performed at [facility], [address], [place], [place] [address]    Report Status PENDING  Incomplete    Coagulation Studies: No results for input(s): LABPROT, INR in the last 72 hours.  Urinalysis: No results for input(s): COLORURINE, LABSPEC, PHURINE, GLUCOSEU, HGBUR, BILIRUBINUR, KETONESUR, PROTEINUR, UROBILINOGEN, NITRITE, LEUKOCYTESUR in the last 72 hours.  Invalid input(s): APPERANCEUR    Imaging: Dg Chest Port 1 View  Result Date: 05/15/2018 CLINICAL DATA:  Pneumonia. EXAM: PORTABLE CHEST 1 VIEW COMPARISON:  05/14/2018. FINDINGS: Endotracheal tube, NG tube in stable position. Persistent cardiomegaly. Progressive bibasilar and bibasilar infiltrates/edema. Tiny pleural effusions cannot be excluded. No pneumothorax. IMPRESSION: 1.  Lines and tubes in stable position. 2. Progressive bibasilar atelectasis. Progressive bibasilar infiltrates/edema. Small bilateral pleural effusions cannot be excluded. 3.  Stable cardiomegaly. Electronically Signed   By: Marcello Moores  Register   On: 05/15/2018 06:55   Dg Chest Port 1 View  Result Date: 05/14/2018 CLINICAL DATA:  CHF, intubated EXAM: PORTABLE CHEST 1 VIEW COMPARISON:  05/12/2018 FINDINGS: Stable support apparatus. Low lung volumes persist with worsening left lower lobe collapse/consolidation obscuring the hemidiaphragm. Interval improvement in the right upper lobe patchy airspace disease. No enlarging effusion or pneumothorax. Trachea is midline. Monitor leads overlie the chest.  Remote cholecystectomy. IMPRESSION: Improving right upper lobe aeration Worsening left lower lobe collapse/consolidation Electronically Signed   By: Jerilynn Mages.  Shick M.D.   On: 05/14/2018 08:40  Medications:   . sodium chloride 10 mL/hr at 05/15/18 0600  . ceFEPime (MAXIPIME) IV 1 g (05/14/18 1043)  . dexmedetomidine (PRECEDEX) IV infusion 0.4 mcg/kg/hr (05/15/18 0818)  . famotidine (PEPCID) IV Stopped (05/14/18 2137)  . fentaNYL infusion INTRAVENOUS Stopped (05/15/18 0820)  . metronidazole 500 mg (05/15/18 0824)   . acetylcysteine  3 mL Nebulization BID  . artificial tears  1 application Both Eyes D0N  . aspirin  81 mg Per Tube Daily  . atorvastatin  40 mg Oral q1800  . chlorhexidine gluconate (MEDLINE KIT)  15 mL Mouth Rinse BID  . clopidogrel  75 mg Oral Daily  . diltiazem  120 mg Oral Daily  . docusate  100 mg Per Tube BID  . furosemide  40 mg Intravenous Once  . ipratropium-albuterol  3 mL Nebulization QID  . mouth rinse  15 mL Mouth Rinse 10 times per day  . potassium chloride  20 mEq Oral Once  . potassium chloride  20 mEq Per Tube Once  . sodium chloride flush  10-40 mL Intracatheter Q12H   acetaminophen **OR** acetaminophen, fentaNYL, hydrALAZINE, midazolam, midazolam, sodium chloride flush, sorbitol  Assessment/ Plan:  Ms. COLLINS DIMARIA is a 62 y.o. black female with dialysis mellitus type II insulin dependent, hypertension, hyperlipidemia, peripheral vascular disease, chronic pancreatitis, anemia of chronic kidney disease admitted on 05/11/2018 for AVF creation by Dr. Delana Meyer. Post operative complication of cardiac arrest  1. Acute renal failure on Chronic kidney disease stage V: with metabolic acidosis. With history of nephrotic range proteinuria.  Baseline creatinine of 4.68, GFR of 11.  Chronic kidney disease secondary to diabetic nephropathy.  No acute indication for dialysis. However with low threshold for initiating dialysis. Currently without access,  At present  UOP ~ 1200 cc; Potassium low at 3.2,  - Appreciate vascular and critical care input.  - no acute indication for HD at present Continue to monitor closely  2.  Cardiac arrest: placed on hypothermic protocol.  - Appreciate cardiology input  3. Acute resp failure with volume overload - iv lasix as per icu team - Kcl supplements for hypokalemia  4. Anemia of chronic kidney disease: normocytic. Hemoglobin dropped to 8.6 Hold ESA due to ischemia  5. Diabetes mellitus type II with chronic kidney disease: insulin dependent. History of poor control. Hemoglobin A1c 6.6% on 02/26/18.   6. Hypokalemia - see above   LOS: 4 Orvill Coulthard 9/3/20199:20 AM

## 2018-05-15 NOTE — Progress Notes (Signed)
Pharmacy Antibiotic Note  Tracy Jennings is a 62 y.o. female admitted on 05/11/2018. Patient s/p cardiac arrest.  Pharmacy consulted 9/1 for cefepime dosing with concern for aspiration PNA. Patient is also receiving Flagyl. Patient is on day 3 of therapy.  Plan: Continue cefepime 1 g IV q24h and Flagyl 500 mg IV q8h  Height: 5' 3.5" (161.3 cm) Weight: 193 lb 9 oz (87.8 kg) IBW/kg (Calculated) : 53.55  Temp (24hrs), Avg:95.4 F (35.2 C), Min:94.5 F (34.7 C), Max:97 F (36.1 C)  Recent Labs  Lab 05/11/18 1922  05/12/18 0225  05/12/18 1310  05/12/18 1605  05/13/18 0450  05/13/18 1518 05/13/18 1849  05/14/18 0323  05/14/18 1154 05/14/18 1558 05/14/18 1912 05/14/18 2304 05/15/18 0300  WBC 12.3*  --  13.1*  --   --   --   --   --  5.7  --   --   --   --  7.2  --   --   --   --   --   --   CREATININE 5.73*   < > 5.76*   < >  --    < >  --    < > 5.58*   < > 5.69* 5.63*   < > 5.97*   < > 5.93* 5.86* 4.54* 5.71* 4.37*  LATICACIDVEN  --   --   --   --  1.1  --  1.2  --   --   --  0.9 0.6  --   --   --   --   --   --   --   --    < > = values in this interval not displayed.    Estimated Creatinine Clearance: 14.5 mL/min (A) (by C-G formula based on SCr of 4.37 mg/dL (H)).    Allergies  Allergen Reactions  . Hctz [Hydrochlorothiazide]     pancreatitis  . Amlodipine Swelling    Knees down to ankles  . Amlodipine Besylate Swelling  . Sulfa Antibiotics Rash  . Dairy Aid [Lactase]     Runny nose    Antimicrobials this admission: Cefepime 9/1 >> Flagyl 9/1 >>  Microbiology results: 9/1 BCx: NGTD 8/30 MRSA PCR: negative  Thank you for allowing pharmacy to be a part of this patient's care.  Tawnya Crook, PharmD Pharmacy Resident  05/15/2018 3:58 PM

## 2018-05-15 NOTE — Progress Notes (Signed)
Chaplain walk by room and Braulio Conte (husband) spoke to her. Chaplain stop in room to speak and Braulio Conte gave Chaplain update on patient . Braulio Conte was very happy of her report. He was thinking of going home for a little while. Chaplain encourage Braulio Conte to get some rest. Chaplain offered Braulio Conte to have her page if needed.

## 2018-05-15 NOTE — Progress Notes (Addendum)
Late Note 1230 Trying to get out of bed. States she wants to go home.Explained she has just been extubated and must stay with [place] in ICU for 24 hours for airway assessment. Then she will move to the floor and stay a few more days. PT and OT are ordered to help her with moving around. Patient was amazed she had been here 4 days. States she only remembers the breathing tube.1430 appears more normal. Laughs at jokes and is more interactive than when first extubated. On room air and using incentive spirometry. Husband in and out. 1600 Still moving around a lot in bed. Confused at times. Hot then cold. Sat patient up on side of bed. Unable to sit up without being supported. Does not know her right from her left. When ask to roll right rolls left. Has not attempted out of bed since we sat her up. Gaze is upward unless she is asked to look at person speaking.

## 2018-05-16 ENCOUNTER — Inpatient Hospital Stay: Payer: Managed Care, Other (non HMO)

## 2018-05-16 DIAGNOSIS — R4182 Altered mental status, unspecified: Secondary | ICD-10-CM

## 2018-05-16 LAB — GLUCOSE, CAPILLARY
Glucose-Capillary: 131 mg/dL — ABNORMAL HIGH (ref 70–99)
Glucose-Capillary: 113 mg/dL — ABNORMAL HIGH (ref 70–99)
Glucose-Capillary: 121 mg/dL — ABNORMAL HIGH (ref 70–99)
Glucose-Capillary: 140 mg/dL — ABNORMAL HIGH (ref 70–99)
Glucose-Capillary: 136 mg/dL — ABNORMAL HIGH (ref 70–99)
Glucose-Capillary: 156 mg/dL — ABNORMAL HIGH (ref 70–99)

## 2018-05-16 LAB — BASIC METABOLIC PANEL
Anion gap: 11 (ref 5–15)
BUN: 62 mg/dL — ABNORMAL HIGH (ref 6–20)
CO2: 21 mmol/L — ABNORMAL LOW (ref 22–32)
Calcium: 8.5 mg/dL — ABNORMAL LOW (ref 8.9–10.3)
Chloride: 118 mmol/L — ABNORMAL HIGH (ref 98–111)
Creatinine, Ser: 5.78 mg/dL — ABNORMAL HIGH (ref 0.44–1.00)
GFR calc Af Amer: 8 mL/min — ABNORMAL LOW (ref >60)
GFR calc non Af Amer: 7 mL/min — ABNORMAL LOW (ref >60)
Glucose, Bld: 123 mg/dL — ABNORMAL HIGH (ref 70–99)
Potassium: 4 mmol/L (ref 3.5–5.1)
Sodium: 150 mmol/L — ABNORMAL HIGH (ref 135–145)

## 2018-05-16 LAB — MAGNESIUM: Magnesium: 2.1 mg/dL (ref 1.7–2.4)

## 2018-05-16 LAB — SURGICAL PATHOLOGY

## 2018-05-16 NOTE — Progress Notes (Signed)
HIT lab results of not being able to use the coagulated sample, MD Schnier notified and no new orders at this time.

## 2018-05-16 NOTE — Progress Notes (Signed)
Nutrition Follow-up  DOCUMENTATION CODES:   Obesity unspecified  INTERVENTION:  Provide Hormel Shake (Vital Cuisine) po BID with lunch and dinner, each supplement provides 520 kcal and 22 grams of protein.  NUTRITION DIAGNOSIS:   Inadequate oral intake related to acute illness(pt sedated and ventilated) as evidenced by NPO status.  Resolving as diet being advanced today.  GOAL:   Patient will meet greater than or equal to 90% of their needs  Progressing.  MONITOR:   PO intake, Supplement acceptance, Labs, Weight trends, I & O's  REASON FOR ASSESSMENT:   Ventilator    ASSESSMENT:   62 y.o. black female with dialysis mellitus type II insulin dependent, hypertension, hyperlipidemia, peripheral vascular disease, chronic pancreatitis, anemia of chronic kidney disease admitted on 05/11/2018 for AVF placement w/ complications requiring thrombectomy and with post op cardiac arrest    -Patient was 36C TTM protocol. Was rewarmed on 9/1 at 1300. -Tube feeds were never initiated. -Patient was extubated on 9/3. -Following SLP evaluation diet advanced to dysphagia 1 with nectar-thick liquids today.  Met with patient at bedside. No family members present at time of RD assessment. Patient awake but was unable to answer any questions. Unable to get nutrition or weight history PTA.  Medications reviewed and include: Colace, famotidine, cefepime, Flagyl.  Labs reviewed: CBG 113-140, Sodium 150, Chloride 118, CO2 21, BUN 62, Creatinine 5.78.  I/O: 875 mL UOP yesterday (0.4 mL/kg/hr)  Discussed with RN and on rounds.  Diet Order:   Diet Order            DIET - DYS 1 Room service appropriate? Yes; Fluid consistency: Nectar Thick  Diet effective now              EDUCATION NEEDS:   Not appropriate for education at this time  Skin:  Skin Assessment: Skin Integrity Issues:(closed incision left arm)  Last BM:  Unknown/PTA  Height:   Ht Readings from Last 1 Encounters:   05/11/18 5' 3.5" (1.613 m)    Weight:   Wt Readings from Last 1 Encounters:  05/16/18 85.7 kg    Ideal Body Weight:  53.4 kg  BMI:  Body mass index is 32.94 kg/m.  Estimated Nutritional Needs:   Kcal:  1640-1915 (MSJ x 1.2-1.4)  Protein:  80-95 grams (1-1.2 grams/kg)  Fluid:  1.6-1.9 L/day  Willey Blade, MS, RD, LDN Office: [phone] Pager: [phone] After Hours/Weekend Pager: [phone]

## 2018-05-16 NOTE — Evaluation (Addendum)
Physical Therapy Evaluation Patient Details Name: Tracy Jennings MRN: [id number] DOB: 08-Mar-1956 Today's Date: 05/16/2018   History of Present Illness  Pt admitted for complication for dialysis access. Pt initially admitted on 8/30 for placement for AV fistula and then had complication for thrombosis post op, went back for thrombectomy and had code blue requiring CPR requiring intubation. Pt extubated on 9/3. Per chart review, looks that pt's fistula is now occluded. HIstory includes CKD, DM, ESRD, HTN, PVD, and pancreatitis.   Clinical Impression  Pt is a pleasant 62 year old female who was admitted for AV fistula placement and has had medical complications s/p Sx. Pt currently in CCU and confused, occasionally can follow commands, however inconsistently. She constantly repeats "thank you Jesus" and rubs abdomen. Doesn't appear in pain. Pt performs bed mobility with mod assist +2 and able to tolerate seated EOB and work on unsupported sitting balance and seated there-ex (with support). Appears to have R side weakness compared to L side. Pt demonstrates deficits with strength/cognition/mobility. Per husband, very active and independent at baseline, currently not at baseline level. Would benefit from skilled PT to address above deficits and promote optimal return to PLOF. Recommending CIR at this time as pt would be good candidate.      Follow Up Recommendations CIR    Equipment Recommendations  Rolling walker with 5" wheels    Recommendations for Other Services Rehab consult     Precautions / Restrictions Precautions Precautions: Fall Restrictions Weight Bearing Restrictions: No      Mobility  Bed Mobility Overal bed mobility: Needs Assistance Bed Mobility: Supine to Sit     Supine to sit: Mod assist;+2 for physical assistance     General bed mobility comments: needs cues for sequencing, heavy post leaning noted. Able to sit at EOB with +2 assist. R side leaning with limited  ability to self correct. Placed R hand on bed for Wbing. Able to tolerate seated at EOB for approx 5 minutes prior to fatigue.  Transfers                 General transfer comment: unsafe to attempt due to cognition  Ambulation/Gait                Stairs            Wheelchair Mobility    Modified Rankin (Stroke Patients Only)       Balance Overall balance assessment: Needs assistance Sitting-balance support: Feet unsupported Sitting balance-Leahy Scale: Fair Sitting balance - Comments: able to maintain unsupported sitting for brief time periods 10-30 seconds at time then with R side leaning.                                      Pertinent Vitals/Pain Pain Assessment: No/denies pain    Home Living Family/patient expects to be discharged to:: Private residence Living Arrangements: Spouse/significant other Available Help at Discharge: Family Type of Home: House Home Access: Stairs to enter Entrance Stairs-Rails: Right Entrance Stairs-Number of Steps: 6 Home Layout: One level Home Equipment: None      Prior Function Level of Independence: Independent         Comments: was working full time and very active, going out in yard etc. Reports no history of falls.      Hand Dominance        Extremity/Trunk Assessment   Upper Extremity Assessment Upper Extremity  Assessment: Generalized weakness(R UE grossly 3+/5; L UE grossly 4/5)    Lower Extremity Assessment Lower Extremity Assessment: Generalized weakness(R LE grossly 3/5; L LE grossly 4/5)       Communication   Communication: No difficulties  Cognition Arousal/Alertness: Awake/alert Behavior During Therapy: Restless Overall Cognitive Status: Impaired/Different from baseline Area of Impairment: Orientation;Memory;Following commands                                      General Comments      Exercises Other Exercises Other Exercises: Pt completed seated  ther-ex including reaching outside of BOS x 5 reps with B UEs. Pt also performed seated LAQ x 5 reps with cga. Safe technique performed.   Assessment/Plan    PT Assessment Patient needs continued PT services  PT Problem List Decreased strength;Decreased activity tolerance;Decreased balance;Decreased mobility;Decreased cognition       PT Treatment Interventions Gait training;Therapeutic exercise;Balance training;Cognitive remediation;Neuromuscular re-education    PT Goals (Current goals can be found in the Care Plan section)  Acute Rehab PT Goals Patient Stated Goal: to get better PT Goal Formulation: With family Time For Goal Achievement: 05/30/18 Potential to Achieve Goals: Good    Frequency 7X/week   Barriers to discharge        Co-evaluation               AM-PAC PT "6 Clicks" Daily Activity  Outcome Measure Difficulty turning over in bed (including adjusting bedclothes, sheets and blankets)?: Unable Difficulty moving from lying on back to sitting on the side of the bed? : Unable Difficulty sitting down on and standing up from a chair with arms (e.g., wheelchair, bedside commode, etc,.)?: Unable Help needed moving to and from a bed to chair (including a wheelchair)?: Total Help needed walking in hospital room?: Total Help needed climbing 3-5 steps with a railing? : Total 6 Click Score: 6    End of Session   Activity Tolerance: Patient tolerated treatment well Patient left: in bed;with bed alarm set Nurse Communication: Mobility status PT Visit Diagnosis: Muscle weakness (generalized) (M62.81);Difficulty in walking, not elsewhere classified (R26.2);Other symptoms and signs involving the nervous system (R29.898)    Time: 1000-1029 PT Time Calculation (min) (ACUTE ONLY): 29 min   Charges:   PT Evaluation $PT Eval Moderate Complexity: 1 Mod PT Treatments $Therapeutic Exercise: 8-22 mins        Greggory Stallion, PT,  DPT [phone]   Jennife Zaucha 05/16/2018, 3:08 PM

## 2018-05-16 NOTE — Progress Notes (Signed)
Pharmacy Antibiotic Note  Tracy Jennings is a 62 y.o. female admitted on 05/11/2018. Patient s/p cardiac arrest.  Pharmacy consulted 9/1 for cefepime dosing with concern for aspiration PNA. Patient is also receiving Flagyl. Patient is on day 4 of therapy.  Plan: Continue cefepime 1 g IV q24h and Flagyl 500 mg IV q8h Consider stopping after 5 days of treatment  Height: 5' 3.5" (161.3 cm) Weight: 188 lb 15 oz (85.7 kg) IBW/kg (Calculated) : 53.55  Temp (24hrs), Avg:97.7 F (36.5 C), Min:96.6 F (35.9 C), Max:98.4 F (36.9 C)  Recent Labs  Lab 05/11/18 1922  05/12/18 0225  05/12/18 1310  05/12/18 1605  05/13/18 0450  05/13/18 1518 05/13/18 1849  05/14/18 0323  05/14/18 1558 05/14/18 1912 05/14/18 2304 05/15/18 0300 05/16/18 0502  WBC 12.3*  --  13.1*  --   --   --   --   --  5.7  --   --   --   --  7.2  --   --   --   --   --   --   CREATININE 5.73*   < > 5.76*   < >  --    < >  --    < > 5.58*   < > 5.69* 5.63*   < > 5.97*   < > 5.86* 4.54* 5.71* 4.37* 5.78*  LATICACIDVEN  --   --   --   --  1.1  --  1.2  --   --   --  0.9 0.6  --   --   --   --   --   --   --   --    < > = values in this interval not displayed.    Estimated Creatinine Clearance: 10.8 mL/min (A) (by C-G formula based on SCr of 5.78 mg/dL (H)).    Allergies  Allergen Reactions  . Hctz [Hydrochlorothiazide]     pancreatitis  . Amlodipine Swelling    Knees down to ankles  . Amlodipine Besylate Swelling  . Sulfa Antibiotics Rash  . Dairy Aid [Lactase]     Runny nose    Antimicrobials this admission: Cefepime 9/1 >> Flagyl 9/1 >>  Microbiology results: 9/1 BCx: NGTD 8/30 MRSA PCR: negative  Thank you for allowing pharmacy to be a part of this patient's care.  Tawnya Crook, PharmD Pharmacy Resident  05/16/2018 1:22 PM

## 2018-05-16 NOTE — Care Management (Signed)
Work note provided on [employer] per patient's husband request; MD signature requested; copy to be placed on chart.

## 2018-05-16 NOTE — Progress Notes (Signed)
Name: Tracy Jennings MRN: [id number] DOB: Aug 09, 1956     CONSULTATION DATE: 05/11/2018  Subjective & objective: Patient was extubated yesterday has been tolerating nasal cannula and no respiratory distress.  PAST MEDICAL HISTORY :   has a past medical history of Anemia (04/2018), CKD (chronic kidney disease), Diabetes mellitus without complication ([place]), ESRD (end stage renal disease) ([place]), Heart murmur, Hyperlipidemia associated with type 2 diabetes mellitus ([place]), Hypertension, Pancreatitis, and Peripheral vascular disease ([place]).  has a past surgical history that includes Cataract extraction; Amputation toe (Left, 2013); Colonoscopy; Colonoscopy with propofol (N/A, 01/10/2018); Eye surgery (Bilateral, 2018); Cholecystectomy (2014); Tubal ligation (1984); AV fistula placement (Left, 05/11/2018); and Thrombectomy w/ embolectomy (05/11/2018). Prior to Admission medications   Medication Sig Start Date End Date Taking? Authorizing Provider  atorvastatin (LIPITOR) 40 MG tablet Take 40 mg by mouth daily at 6 PM.  10/05/15 05/11/18 Yes [provider]  bumetanide (BUMEX) 1 MG tablet Take 2 mg by mouth 2 (two) times daily.  02/28/18 02/28/19 Yes [provider]  carvedilol (COREG CR) 40 MG 24 hr capsule Take 40 mg by mouth daily. Takes in the morning when she gets home from work 11/06/17  Yes [provider]  diltiazem (CARDIZEM LA) 120 MG 24 hr tablet Take 1 tablet (120 mg total) by mouth daily. Patient taking differently: Take 120 mg by mouth daily. Takes in the morning when she gets home from work 08/08/17  Yes Cuthriell, Charline Bills, PA-C  fluticasone (FLONASE) 50 MCG/ACT nasal spray 2 sprays by Each Nare route daily. 01/15/18 01/15/19 Yes [provider]  hydrALAZINE (APRESOLINE) 50 MG tablet Take 50 mg by mouth every 8 (eight) hours.  10/24/17  Yes [provider]  Insulin Glargine 300 UNIT/ML SOPN Inject 40 Units into the skin daily. In the morning.  (toujeo)  11/30/15  Yes [provider]  amLODipine (NORVASC) 5 MG tablet Take 1 tablet by mouth once daily 01/18/16   [provider]  aspirin EC 81 MG tablet Take 81 mg by mouth daily. 09/27/17   [provider]  HYDROcodone-acetaminophen (NORCO) 5-325 MG tablet Take 1-2 tablets by mouth every 6 (six) hours as needed for moderate pain or severe pain. 05/11/18   Schnier, Dolores Lory, MD  insulin aspart (NOVOLOG) 100 UNIT/ML FlexPen Inject 10-12 Units into the skin 3 (three) times daily with meals.  12/28/15   [provider]  losartan (COZAAR) 100 MG tablet Take 1 tablet by mouth once daily 01/18/16   [provider]  metoprolol (LOPRESSOR) 50 MG tablet Take 50 mg by mouth daily.  02/20/15   [provider]  ondansetron (ZOFRAN) 4 MG tablet Take 1 tablet (4 mg total) by mouth every 6 (six) hours as needed for nausea. Patient not taking: Reported on 05/04/2018 11/03/17   Epifanio Lesches, MD  oxyCODONE (OXY IR/ROXICODONE) 5 MG immediate release tablet Take 1 tablet (5 mg total) by mouth every 4 (four) hours as needed for moderate pain. Patient not taking: Reported on 05/04/2018 11/03/17   Epifanio Lesches, MD  spironolactone (ALDACTONE) 50 MG tablet Take 50 mg by mouth daily. 09/27/17   [provider]  Vitamin D, Ergocalciferol, (DRISDOL) 50000 units CAPS capsule TAKE ONE CAPSULE BY MOUTH ONE TIME PER WEEK 01/06/18   [provider]   Allergies  Allergen Reactions  . Hctz [Hydrochlorothiazide]     pancreatitis  . Amlodipine Swelling    Knees down to ankles  . Amlodipine Besylate Swelling  . Sulfa Antibiotics  Rash  . Dairy Aid [Lactase]     Runny nose    FAMILY HISTORY:  family history includes Cancer in her father; Diabetes in her maternal grandfather, maternal grandmother, paternal grandfather, paternal grandmother, and sister; Hypertension in her mother; Stroke in her mother. SOCIAL HISTORY:  reports that she has never smoked. She  has never used smokeless tobacco. She reports that she does not drink alcohol or use drugs.  REVIEW OF SYSTEMS:   Unable to obtain due to critical illness   VITAL SIGNS: Temp:  [96.1 F (35.6 C)-98.4 F (36.9 C)] 97.7 F (36.5 C) (09/04 0800) Pulse Rate:  [69-114] 100 (09/04 0800) Resp:  [6-25] 22 (09/04 0800) BP: (141-195)/(65-94) 174/75 (09/04 0800) SpO2:  [96 %-100 %] 100 % (09/04 0800) Weight:  [85.7 kg] 85.7 kg (09/04 0500)  Physical Examination: Awake and oriented moving left side more than the right following simple command however noticed to be slow to respond and does not keep eye contact or focusing during conversation.  Status post induced hypothermia. detailed neuro exam as per neurology [place], no distress, able to talk,  bilateral equal air entry and no adventitious sounds S1 & S2 are audible with no murmur Benign abdominal exam with feeble peristalsis Dress left arm newly created AV fistula, no thrill palpable No edema   ASSESSMENT / PLAN:  Acute respiratory failure.  Status post hypothermia protocol. Extubated -Monitor work of breathing and O2 sat.  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 -Monitor neuro statusand management as per neurology -Consider MRI if [facility] with neurology  Cardiac arrest in OR after refashioning of left arm AV fistula created for dialysis. ECHO LVEF 77-11%, diastolic dysfunction grade I. Elevated Troponin, no ischemic changes on EKG.ASA + Plavix -Status post induced hypothermia -Follow with cardiology for further work-up andrecommendation. -Hemodynamic monitoring.  Atelectasis and pneumonia with possible aspiration s/p cardiac arrest.Improved bibasilar airspace disease.MRSA PCR negative -Empiric Cefep + Flagyl. Monitor CXR + CBC + FIO2.  CHF with pulmonary congestion on chest  x-ray post intubation -Diuresis/RRT as per renal to improve lung compliance.  Hypotension with meds / sepsis(improved) -Monitor hemodynamics, procal., Lactic acid(0.6) and optimize meds.  HTN -Optimize antihypertensives and monitor hemodynamics.  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 S/p placement of Lt. Arm AVF.Needs dialysis access -Management as per renal. Not on RRT.  Left arm AV fistulawith questionable malfunctioning -Management as per vascular  Diabetes mellitus -Glycemic control  Dyslipidemia -Statin  Anemia -Hemoglobin more than7g/dL  Thrombocytopenia (improved) -Monitor Plat  Full code. Patient doesn't have capacity to make decisions.  DVT and GI prophylaxis. Continue with supportive care  Critical care time 35 minutes

## 2018-05-16 NOTE — Evaluation (Signed)
Clinical/Bedside Swallow Evaluation Patient Details  Name: Tracy Jennings MRN: [id number] Date of Birth: 1956-01-31  Today's Date: 05/16/2018 Time: SLP Start Time (ACUTE ONLY): 18 SLP Stop Time (ACUTE ONLY): 1455 SLP Time Calculation (min) (ACUTE ONLY): 40 min  Past Medical History:  Past Medical History:  Diagnosis Date  . Anemia 04/2018   low iron. to be started on supplements  . CKD (chronic kidney disease)    Stage IV  . Diabetes mellitus without complication ([place])    type II  . ESRD (end stage renal disease) ([place])   . Heart murmur    followed as a child only  . Hyperlipidemia associated with type 2 diabetes mellitus ([place])   . Hypertension   . Pancreatitis   . Peripheral vascular disease [facility])    Past Surgical History:  Past Surgical History:  Procedure Laterality Date  . AMPUTATION TOE Left 2013   2nd toe. tip of toe (toe nail was infected)  . AV FISTULA PLACEMENT Left 05/11/2018   Procedure: ARTERIOVENOUS (AV) FISTULA CREATION;  Surgeon: Katha Cabal, MD;  Location: [facility] ORS;  Service: Vascular;  Laterality: Left;  . CATARACT EXTRACTION    . CHOLECYSTECTOMY  2014  . COLONOSCOPY    . COLONOSCOPY WITH PROPOFOL N/A 01/10/2018   Procedure: COLONOSCOPY WITH PROPOFOL;  Surgeon: Toledo, Benay Pike, MD;  Location: [facility] ENDOSCOPY;  Service: Gastroenterology;  Laterality: N/A;  . EYE SURGERY Bilateral 2018   cataract extractions  . THROMBECTOMY W/ EMBOLECTOMY  05/11/2018   Procedure: THROMBECTOMY ARTERIOVENOUS FISTULA;  Surgeon: Katha Cabal, MD;  Location: [facility] ORS;  Service: Vascular;;  . TUBAL LIGATION  1984   HPI:  Patient is a 62 y.o. female with PMHx of Anemia (04/2018), CKD (chronic kidney disease), Diabetes mellitus without complication ([place]), ESRD (end stage renal disease) ([place]), Heart murmur, Hyperlipidemia associated with type 2 diabetes mellitus ([place]), Hypertension, Pancreatitis, and Peripheral vascular disease ([place]). Post operative complication of cardiac  arrest with physician report stating "Atelectasis and pneumonia with possible aspiration s/p cardiac arrest.  Improved bibasilar airspace disease.  MRSA PCR negative". Altered mental status however "No acute infarct on CT head 05/16/2018". Patient extubated on 05/15/18.   Assessment / Plan / Recommendation Clinical Impression  Patient presents with moderate oral (possibly oropharyngeal dysphagia) c/b cognitive feeding deficits, decreased labial closure, weak volitional cough, dysphonia, decreased hyolaryngeal elevation upon palpation resutling in anterior oral spillage of thin liquids, delayed AP transit, and observed immediate and delayed coughing on thin liquids via spoon and cup. Patient unable to self-feed even with max cues, requiring 1:1 feeding assistance from SLP. Pt able to follow simple 1 step directions during evaluation, however required verbal cues to swallow and increase awareness to bolus in oral cavity. She perseverated on phrases such as "Thank you, Jesus" and "yes ma'am" frequently during evaluation and unable to provide appropriate responses to most wh-questions. Of note, patient observed with labored breathing (however denied pain and/or difficulty breathing), resulting in incoordation with inhaling/exhaling during intake. Patient tolerated 5-10 small bites of puree wihtout anterior oral spillage and small sips of nectar-thick liquids via cup wihtout overt s/s of aspiraiton. Recommendations include puree and nectar thick liquid diet (cup only, no straws) given 1:1 feeding assistance with use of aspiration precautions to decrease risk of aspiration/choking - precautions posted in patients room. Oral care recommended before/after PO intake to decrease risk of aspiration PNA. Ice chips may be provided in between meals following oral care. NSG verbalized understanding and agreement to recommendations. Will  continue to monitor and upgrade as appropriate. SLP Visit Diagnosis: Dysphagia, oropharyngeal  phase (R13.12)    Aspiration Risk  Moderate aspiration risk    Diet Recommendation Dysphagia 1 (Puree);Nectar-thick liquid ; Aspiration precautions: Requires 1:1 feeding assistance, Aspiration precautions (I.e. Small bites/sips, slow rate, upright for all PO intake, minimize distractions during PO intake), Oral care before/after PO intake, May have ice chips in between meals following oral care.   Liquid Administration via: Cup;No straw Medication Administration: Crushed with puree Supervision: Staff to assist with self feeding;Full supervision/cueing for compensatory strategies Compensations: Minimize environmental distractions;Slow rate;Small sips/bites;Monitor for anterior loss Postural Changes: Seated upright at 90 degrees    Other  Recommendations Recommended Consults: (Consider dietician consultation) Oral Care Recommendations: Oral care before and after PO;Staff/trained caregiver to provide oral care;Oral care prior to ice chip/H20   Follow up Recommendations        Frequency and Duration min 3x week  2 weeks       Prognosis Prognosis for Safe Diet Advancement: Fair Barriers to Reach Goals: Cognitive deficits;Severity of deficits Barriers/Prognosis Comment: medical diagnoses, need for 1:1 feeding assistance      Swallow Study   General HPI: Patient is a 62 y.o. female with PMHx of Anemia (04/2018), CKD (chronic kidney disease), Diabetes mellitus without complication ([place]), ESRD (end stage renal disease) ([place]), Heart murmur, Hyperlipidemia associated with type 2 diabetes mellitus ([place]), Hypertension, Pancreatitis, and Peripheral vascular disease ([place]). Post operative complication of cardiac arrest with physician report stating "Atelectasis and pneumonia with possible aspiration s/p cardiac arrest.  Improved bibasilar airspace disease.  MRSA PCR negative". Altered mental status however "No acute infarct on CT head 05/16/2018". Patient extubated on 05/15/18. Type of Study: Bedside  Swallow Evaluation Diet Prior to this Study: NPO Respiratory Status: Room air History of Recent Intubation: Yes Length of Intubations (days): 4 days Date extubated: 05/15/18 Behavior/Cognition: Lethargic/Drowsy;Requires cueing;Doesn't follow directions Oral Cavity Assessment: Dry Oral Care Completed by SLP: Yes Oral Cavity - Dentition: Missing dentition Self-Feeding Abilities: Total assist Patient Positioning: Upright in bed Baseline Vocal Quality: Breathy;Low vocal intensity Volitional Cough: Weak Volitional Swallow: Able to elicit    Oral/Motor/Sensory Function Overall Oral Motor/Sensory Function: Within functional limits(Oral Motor exam grossly WNL, however constant verbal/verbal/tactile cues required during PO intake) Facial ROM: Within Functional Limits Facial Symmetry: Within Functional Limits Lingual ROM: Within Functional Limits Lingual Symmetry: Within Functional Limits Lingual Sensation: Within Functional Limits Mandible: Within Functional Limits   Ice Chips Ice chips: Within functional limits Presentation: Spoon Other Comments: Anterior oral spillage noted in x2 observations, possibly d/t cogntive deficits and decreased attention to task - requiring constant cues during evaluation. however, no coughing/choking noted with ice chips.    Thin Liquid Thin Liquid: Impaired Presentation: Cup;Spoon Oral Phase Impairments: Poor awareness of bolus;Reduced labial seal Oral Phase Functional Implications: Right anterior spillage;Oral holding;Prolonged oral transit Pharyngeal  Phase Impairments: Suspected delayed Swallow;Cough - Immediate;Cough - Delayed    Nectar Thick Nectar Thick Liquid: Within functional limits Presentation: Cup Other Comments: No obseved coughing/choking with nectar thick liquids via cup sips.   Honey Thick Honey Thick Liquid: Not tested   Puree Puree: Impaired Presentation: Spoon Oral Phase Impairments: Poor awareness of bolus Other Comments: Patient  required verbal/visual/tactile cues given 1:1 feeding assistance with small bites of puree without observed coughing/choking.   Solid     Solid: Not tested Other Comments: D/t severity of cognitive deficits, decreased awareness, need for constant cues during session, and need for feeding assistance, solid consistencies were not  tested during todays evaluation.       [name], M.S. CCC-SLP Speech-Language Pathologist  [name] 05/16/2018,4:04 PM

## 2018-05-16 NOTE — Evaluation (Signed)
Occupational Therapy Evaluation Patient Details Name: Tracy Jennings MRN: [id number] DOB: 05/11/1956 Today's Date: 05/16/2018    History of Present Illness Pt admitted for complication for dialysis access. Pt initially admitted on 8/30 for placement for AV fistula and then had complication for thrombosis post op, went back for thrombectomy and had code blue requiring CPR requiring intubation. Pt extubated on 9/3. Per chart review, looks that pt's fistula is now occluded. HIstory includes CKD, DM, ESRD, HTN, PVD, and pancreatitis.    Clinical Impression   Pt seen for OT evaluation this date. Per husband and chart review, prior to hospital admission, pt was independent, active, working 2 jobs, and with no falls history. Pt lives with her spouse in 1 story home with 2 steps and no rail in front and 6 steps and R handrail in back. Pt sleeping upon arrival, wakes easily to OT's voice. Pleasant, responds to questions, although inconsistently. Pt follows simple commands inconsistently, repeatedly whispers "thank you Jesus" and "praise Jesus" t/o evaluation. Pt demo's impairments in strength grossly (R side slightly weaker than L side), mild swelling noted in RUE (repositioned on pillow), confusion and oriented to self and place, and requires significant assist for bed mobility. Given current impairments, pt requires mod-max assist for ADL.  Pt would benefit from skilled OT to address noted impairments and functional limitations (see below for any additional details) in order to maximize safety and independence while minimizing falls risk and caregiver burden.  Upon hospital discharge, recommend pt discharge to acute, high-intensity acute rehab to maximize return to PLOF.    Follow Up Recommendations  CIR    Equipment Recommendations  Other (comment)(TBD)    Recommendations for Other Services       Precautions / Restrictions Precautions Precautions: Fall Restrictions Weight Bearing Restrictions: No       Mobility Bed Mobility Overal bed mobility: Needs Assistance     General bed mobility comments: pt unsafe to attempt bed mobility this pm 2/2 lethargy, will re-attempt next session  Transfers                    Balance                                   ADL either performed or assessed with clinical judgement   ADL Overall ADL's : Needs assistance/impaired Eating/Feeding: NPO   Grooming: Bed level;Minimal assistance;Moderate assistance   Upper Body Bathing: Bed level;Moderate assistance   Lower Body Bathing: Bed level;Maximal assistance   Upper Body Dressing : Bed level;Moderate assistance   Lower Body Dressing: Bed level;Maximal assistance                       Vision Baseline Vision/History: Wears glasses Wears Glasses: Reading only Vision Assessment?: Yes Eye Alignment: Within Functional Limits Alignment/Gaze Preference: Within Defined Limits Tracking/Visual Pursuits: Impaired - to be further tested in functional context;Requires cues, head turns, or add eye shifts to track(difficulty with tracking, requiring max verbal cues - will continue to assess, pt lethargic this pm) Visual Fields: No apparent deficits     Perception     Praxis      Pertinent Vitals/Pain Pain Assessment: Faces Faces Pain Scale: No hurt Pain Intervention(s): Monitored during session     Hand Dominance     Extremity/Trunk Assessment Upper Extremity Assessment Upper Extremity Assessment: Generalized weakness;RUE deficits/detail;LUE deficits/detail RUE Deficits / Details: R UE  grossly 3/5, pt denies sensory deficits; possible swelling noted in RUE, repositioned on pillows RUE Coordination: decreased fine motor;decreased gross motor LUE Deficits / Details: L UE grossly 4/5; LUE fistula   Lower Extremity Assessment Lower Extremity Assessment: Defer to PT evaluation;Generalized weakness(R LE grossly 3/5; L LE grossly 4/5)       Communication  Communication Communication: No difficulties   Cognition Arousal/Alertness: Lethargic Behavior During Therapy: Flat affect Overall Cognitive Status: Impaired/Different from baseline Area of Impairment: Orientation;Memory;Following commands                 Orientation Level: Disoriented to;Time;Situation   Memory: Decreased short-term memory Following Commands: Follows one step commands inconsistently           General Comments       Exercises    Shoulder Instructions      Home Living Family/patient expects to be discharged to:: Private residence Living Arrangements: Spouse/significant other Available Help at Discharge: Family Type of Home: House Home Access: Stairs to enter [employer] of Steps: 2 w/ no rails in front, 6 steps with R rail in back Entrance Stairs-Rails: Right Home Layout: One level     Bathroom Shower/Tub: Teacher, early years/pre: [place]: None          Prior Functioning/Environment Level of Independence: Independent        Comments: was working full time (2 jobs) and very active, going out in yard etc. Reports no history of falls.         OT Problem List: Decreased strength;Decreased knowledge of use of DME or AE;Increased edema;Impaired UE functional use;Decreased cognition;Decreased activity tolerance;Impaired balance (sitting and/or standing)      OT Treatment/Interventions: Self-care/ADL training;Balance training;Therapeutic exercise;Therapeutic activities;Neuromuscular education;DME and/or AE instruction;Cognitive remediation/compensation;Patient/family education    OT Goals(Current goals can be found in the care plan section) Acute Rehab OT Goals Patient Stated Goal: pt unable to verbalize - spouse wants pt to return to PLOF/independence OT Goal Formulation: With patient/family Time For Goal Achievement: 05/30/18 Potential to Achieve Goals: Good  OT Frequency: Min 3X/week    Barriers to D/C:            Co-evaluation              AM-PAC PT "6 Clicks" Daily Activity     Outcome Measure Help from another person eating meals?: Total(NPO) Help from another person taking care of personal grooming?: A Lot Help from another person toileting, which includes using toliet, bedpan, or urinal?: A Lot Help from another person bathing (including washing, rinsing, drying)?: A Lot Help from another person to put on and taking off regular upper body clothing?: A Lot Help from another person to put on and taking off regular lower body clothing?: A Lot 6 Click Score: 11   End of Session    Activity Tolerance: Patient limited by lethargy Patient left: in bed;with call bell/phone within reach;with bed alarm set;with family/visitor present  OT Visit Diagnosis: Other abnormalities of gait and mobility (R26.89);Muscle weakness (generalized) (M62.81);Other symptoms and signs involving cognitive function                Time: 1510-1523 OT Time Calculation (min): 13 min Charges:  OT General Charges $OT Visit: 1 Visit OT Evaluation $OT Eval Moderate Complexity: 1 Mod  [name], MPH, MS, OTR/L ascom [phone] 05/16/18, 3:47 PM

## 2018-05-16 NOTE — Progress Notes (Signed)
Pharmacy Electrolyte Monitoring Consult:  Pharmacy consulted to assist in monitoring and replacing electrolytes in this 62 y.o. female admitted on 05/11/2018 with cardiac arrest in the OR. S/P rewarming.   Labs:  Sodium (mmol/L)  Date Value  05/16/2018 150 (H)  06/14/2014 139   Potassium (mmol/L)  Date Value  05/16/2018 4.0  06/14/2014 3.6   Magnesium (mg/dL)  Date Value  05/16/2018 2.1  12/10/2013 1.8   Phosphorus (mg/dL)  Date Value  05/13/2018 4.2   Calcium (mg/dL)  Date Value  05/16/2018 8.5 (L)   Calcium, Total (mg/dL)  Date Value  06/14/2014 8.4 (L)   Albumin (g/dL)  Date Value  05/14/2018 1.7 (L)  06/12/2014 2.9 (L)    Assessment/Plan: Electrolytes WNL. BMP ordered with am labs.   Will defer electrolyte replacement to nephrology at this time.  Pharmacy to sign off on this consult.  Tawnya Crook, PharmD Pharmacy Resident  05/16/2018 1:33 PM

## 2018-05-16 NOTE — Progress Notes (Signed)
[facility]  ROUNDING NOTE   Subjective:   Doing fair Extubated Following simple commands but not able to answer questions   Objective:  Vital signs in last 24 hours:  Temp:  [95.9 F (35.5 C)-98.4 F (36.9 C)] 97.7 F (36.5 C) (09/04 0800) Pulse Rate:  [69-114] 100 (09/04 0800) Resp:  [6-25] 22 (09/04 0800) BP: (141-195)/(65-94) 174/75 (09/04 0800) SpO2:  [96 %-100 %] 100 % (09/04 0800) Weight:  [85.7 kg] 85.7 kg (09/04 0500)  Weight change: -2.1 kg Filed Weights   05/14/18 0410 05/15/18 0349 05/16/18 0500  Weight: 88.1 kg 87.8 kg 85.7 kg    Intake/Output: I/O last 3 completed shifts: In: 1337.6 [I.V.:891.5; IV Piggyback:446.1] Out: 1975 [Urine:1275; Emesis/NG output:700]   Intake/Output this shift:  No intake/output data recorded.  Physical Exam: General: Critically ill  Head: Dry oral mucus membranes  Eyes: Anicteric   Neck: Supple,    Lungs:  Coarse at bases,    Heart: Regular rate and rhythm, tachycardic  Abdomen:  Soft, nontender,   Extremities: + dependent peripheral edema.  Neurologic:  able to follow simple cammands  Skin: No lesions  Access: Left AVF - no thrill or bruit appreciated.     Basic Metabolic Panel: Recent Labs  Lab 05/11/18 1922  05/12/18 0225  05/13/18 0450  05/14/18 1558 05/14/18 1912 05/14/18 2304 05/15/18 0300 05/15/18 1601 05/16/18 0502  NA 139   < > 141   < > 143   < > 143 145 144 146*  --  150*  K 5.3*   < > 4.1   < > 3.4*   < > 4.4 3.2* 4.1 3.2* 4.5 4.0  CL 111   < > 109   < > 112*   < > 112* 124* 116* 125*  --  118*  CO2 21*   < > 22   < > 21*   < > 21* 16* 21* 18*  --  21*  GLUCOSE 192*   < > 235*   < > 106*   < > 199* 154* 194* 151*  --  123*  BUN 59*   < > 61*   < > 62*   < > 60* 47* 59* 48*  --  62*  CREATININE 5.73*   < > 5.76*   < > 5.58*   < > 5.86* 4.54* 5.71* 4.37*  --  5.78*  CALCIUM 8.0*   < > 7.9*   < > 7.5*   < > 7.7* 5.9* 7.8* 6.1*  --  8.5*  MG 1.7  --  1.9  --  1.8  --   --   --   --    --   --  2.1  PHOS 5.9*  --   --   --  4.2  --   --   --   --   --   --   --    < > = values in this interval not displayed.    Liver Function Tests: Recent Labs  Lab 05/11/18 1922 05/13/18 0450 05/14/18 2000  AST 39 19  --   ALT 32 11  --   ALKPHOS 42 37*  --   BILITOT 0.5 0.5  --   PROT 5.8* 4.7*  --   ALBUMIN 2.8* 2.4* 1.7*   No results for input(s): LIPASE, AMYLASE in the last 168 hours. No results for input(s): AMMONIA in the last 168 hours.  CBC: Recent Labs  Lab 05/11/18 1922 05/12/18  0225 05/13/18 0450 05/14/18 0323  WBC 12.3* 13.1* 5.7 7.2  NEUTROABS 11.2*  --  3.7 5.2  HGB 9.0* 8.7* 7.8* 8.6*  HCT 27.5* 26.4* 23.1* 25.1*  MCV 90.5 89.7 87.8 90.1  PLT 207 143* 174 170    Cardiac Enzymes: Recent Labs  Lab 05/11/18 1922 05/11/18 2105 05/12/18 0225 05/12/18 0818 05/12/18 1413  TROPONINI 0.03* 0.05* 0.08* 0.07* 0.06*    BNP: Invalid input(s): POCBNP  CBG: Recent Labs  Lab 05/15/18 0758 05/15/18 1937 05/16/18 0101 05/16/18 0333 05/16/18 0717  GLUCAP 165* 175* 131* 113* 121*    Microbiology: Results for orders placed or performed during the hospital encounter of 05/11/18  MRSA PCR Screening     Status: None   Collection Time: 05/11/18  7:56 PM  Result Value Ref Range Status   MRSA by PCR NEGATIVE NEGATIVE Final    Comment:        The GeneXpert MRSA Assay (FDA approved for NASAL specimens only), is one component of a comprehensive MRSA colonization surveillance program. It is not intended to diagnose MRSA infection nor to guide or monitor treatment for MRSA infections. Performed at [facility], [address] [place], [place] [address]   CULTURE, BLOOD (ROUTINE X 2) w Reflex to ID Panel     Status: None (Preliminary result)   Collection Time: 05/13/18  3:18 PM  Result Value Ref Range Status   Specimen Description BLOOD BFOA  Final   Special Requests   Final    BOTTLES DRAWN AEROBIC AND ANAEROBIC Blood Culture adequate  volume   Culture   Final    NO GROWTH 3 DAYS Performed at [facility], [address] [place], [place] [address]    Report Status PENDING  Incomplete  CULTURE, BLOOD (ROUTINE X 2) w Reflex to ID Panel     Status: None (Preliminary result)   Collection Time: 05/13/18  4:33 PM  Result Value Ref Range Status   Specimen Description BLOOD L HAND  Final   Special Requests   Final    BOTTLES DRAWN AEROBIC AND ANAEROBIC Blood Culture adequate volume   Culture   Final    NO GROWTH 3 DAYS Performed at [facility], [address] [place], [place] [address]    Report Status PENDING  Incomplete    Coagulation Studies: No results for input(s): LABPROT, INR in the last 72 hours.  Urinalysis: No results for input(s): COLORURINE, LABSPEC, PHURINE, GLUCOSEU, HGBUR, BILIRUBINUR, KETONESUR, PROTEINUR, UROBILINOGEN, NITRITE, LEUKOCYTESUR in the last 72 hours.  Invalid input(s): APPERANCEUR    Imaging: Ct Head Wo Contrast  Result Date: 05/16/2018 CLINICAL DATA:  Altered mental status.  Recent cardiac arrest EXAM: CT HEAD WITHOUT CONTRAST TECHNIQUE: Contiguous axial images were obtained from the base of the skull through the vertex without intravenous contrast. COMPARISON:  May 11, 2018 FINDINGS: Brain: Ventricles are normal in size and configuration. There is no appreciable intracranial mass hemorrhage, extra-axial fluid collection, or midline shift. There is preservation gray-white differentiation. There is slight small vessel disease in the centra semiovale bilaterally, stable. There is no new gray-white compartment lesion. No acute infarct is demonstrable on this study. Vascular: No hyperdense vessel evident. No appreciable vascular calcification. Skull: Bony calvarium appears intact. Sinuses/Orbits: There is mucosal thickening in several ethmoid air cells. There is also mild mucosal thickening in the sphenoid sinus regions. Other paranasal sinuses which are visualized are  clear. Visualized orbits appear symmetric bilaterally. Other: Mastoid air cells are clear. IMPRESSION: Stable slight  periventricular small vessel disease. No acute infarct evident. There is preservation of gray-white differentiation. No mass or hemorrhage. Mild paranasal sinus disease. Electronically Signed   By: Lowella Grip III M.D.   On: 05/16/2018 07:49   Dg Chest Port 1 View  Result Date: 05/16/2018 CLINICAL DATA:  Airspace consolidation EXAM: PORTABLE CHEST 1 VIEW COMPARISON:  May 15, 2018 FINDINGS: There is persistent consolidation in the left lower lobe. There is atelectatic change in the medial right base. Lungs elsewhere are clear. Heart is mildly enlarged with pulmonary vascularity normal. No adenopathy. No bone lesions. Note that nasogastric tube and endotracheal tube have been removed. No pneumothorax. IMPRESSION: Airspace consolidation left lower lobe, likely due to pneumonia. Medial right base atelectasis. Lungs elsewhere clear. No pneumothorax. Stable cardiac prominence. Electronically Signed   By: Lowella Grip III M.D.   On: 05/16/2018 07:50   Dg Chest Port 1 View  Result Date: 05/15/2018 CLINICAL DATA:  Pneumonia. EXAM: PORTABLE CHEST 1 VIEW COMPARISON:  05/14/2018. FINDINGS: Endotracheal tube, NG tube in stable position. Persistent cardiomegaly. Progressive bibasilar and bibasilar infiltrates/edema. Tiny pleural effusions cannot be excluded. No pneumothorax. IMPRESSION: 1.  Lines and tubes in stable position. 2. Progressive bibasilar atelectasis. Progressive bibasilar infiltrates/edema. Small bilateral pleural effusions cannot be excluded. 3.  Stable cardiomegaly. Electronically Signed   By: Marcello Moores  Register   On: 05/15/2018 06:55     Medications:   . sodium chloride 10 mL/hr at 05/15/18 0600  . ceFEPime (MAXIPIME) IV 1 g (05/16/18 0904)  . metronidazole 500 mg (05/16/18 0130)   . acetylcysteine  3 mL Nebulization BID  . aspirin  81 mg Per Tube Daily  .  atorvastatin  40 mg Oral q1800  . clopidogrel  75 mg Oral Daily  . diltiazem  120 mg Oral Daily  . docusate  100 mg Per Tube BID  . famotidine  20 mg Per Tube QHS  . ipratropium-albuterol  3 mL Nebulization QID  . potassium chloride  20 mEq Per Tube Once  . sodium chloride flush  10-40 mL Intracatheter Q12H  . tuberculin  5 Units Intradermal Once   acetaminophen **OR** acetaminophen, hydrALAZINE, sodium chloride flush, sorbitol  Assessment/ Plan:  Ms. NATANE HEWARD is a 62 y.o. black female with dialysis mellitus type II insulin dependent, hypertension, hyperlipidemia, peripheral vascular disease, chronic pancreatitis, anemia of chronic kidney disease admitted on 05/11/2018 for AVF creation by Dr. Delana Meyer. Post operative complication of cardiac arrest  1. Acute renal failure on Chronic kidney disease stage V: with metabolic acidosis.  With history of nephrotic range proteinuria.  Baseline creatinine of 4.68, GFR of 11.  Chronic kidney disease secondary to diabetic nephropathy.  No acute indication for dialysis. However with low threshold for initiating dialysis. Currently without access,  At present UOP ~ 875 cc;  - Appreciate vascular and critical care input.  - no acute indication for HD at present but will discuss with family to start this admission Continue to monitor closely  2.  Cardiac arrest: placed on hypothermic protocol.  - Appreciate cardiology input  3. Acute resp failure with volume overload - extubated now.  4. Anemia of chronic kidney disease: normocytic. Hemoglobin dropped to 8.6 Hold ESA due to ischemia  5. Diabetes mellitus type II with chronic kidney disease: insulin dependent. History of poor control. Hemoglobin A1c 6.6% on 02/26/18.   6. Hypokalemia - see above   LOS: 5 Cintya Daughety 9/4/20199:22 AM

## 2018-05-17 ENCOUNTER — Encounter: Payer: Self-pay | Admitting: Internal Medicine

## 2018-05-17 ENCOUNTER — Inpatient Hospital Stay: Payer: Managed Care, Other (non HMO)

## 2018-05-17 DIAGNOSIS — N179 Acute kidney failure, unspecified: Secondary | ICD-10-CM

## 2018-05-17 LAB — CBC WITH DIFFERENTIAL/PLATELET
Basophils Absolute: 0 10*3/uL (ref 0–0.1)
Basophils Relative: 0 %
Eosinophils Absolute: 0 10*3/uL (ref 0–0.7)
Eosinophils Relative: 0 %
HCT: 25.7 % — ABNORMAL LOW (ref 35.0–47.0)
Hemoglobin: 8.3 g/dL — ABNORMAL LOW (ref 12.0–16.0)
Lymphocytes Relative: 5 %
Lymphs Abs: 0.5 10*3/uL — ABNORMAL LOW (ref 1.0–3.6)
MCH: 29.3 pg (ref 26.0–34.0)
MCHC: 32.4 g/dL (ref 32.0–36.0)
MCV: 90.5 fL (ref 80.0–100.0)
Monocytes Absolute: 1.1 10*3/uL — ABNORMAL HIGH (ref 0.2–0.9)
Monocytes Relative: 10 %
Neutro Abs: 9.4 10*3/uL — ABNORMAL HIGH (ref 1.4–6.5)
Neutrophils Relative %: 85 %
Platelets: 248 10*3/uL (ref 150–440)
RBC: 2.84 MIL/uL — ABNORMAL LOW (ref 3.80–5.20)
RDW: 14.5 % (ref 11.5–14.5)
WBC: 11.1 10*3/uL — ABNORMAL HIGH (ref 3.6–11.0)

## 2018-05-17 LAB — BASIC METABOLIC PANEL
Anion gap: 12 (ref 5–15)
BUN: 74 mg/dL — ABNORMAL HIGH (ref 6–20)
CO2: 18 mmol/L — ABNORMAL LOW (ref 22–32)
Calcium: 8.2 mg/dL — ABNORMAL LOW (ref 8.9–10.3)
Chloride: 118 mmol/L — ABNORMAL HIGH (ref 98–111)
Creatinine, Ser: 6.5 mg/dL — ABNORMAL HIGH (ref 0.44–1.00)
GFR calc Af Amer: 7 mL/min — ABNORMAL LOW (ref >60)
GFR calc non Af Amer: 6 mL/min — ABNORMAL LOW (ref >60)
Glucose, Bld: 153 mg/dL — ABNORMAL HIGH (ref 70–99)
Potassium: 4.5 mmol/L (ref 3.5–5.1)
Sodium: 148 mmol/L — ABNORMAL HIGH (ref 135–145)

## 2018-05-17 LAB — GLUCOSE, CAPILLARY
Glucose-Capillary: 121 mg/dL — ABNORMAL HIGH (ref 70–99)
Glucose-Capillary: 121 mg/dL — ABNORMAL HIGH (ref 70–99)
Glucose-Capillary: 140 mg/dL — ABNORMAL HIGH (ref 70–99)
Glucose-Capillary: 150 mg/dL — ABNORMAL HIGH (ref 70–99)
Glucose-Capillary: 154 mg/dL — ABNORMAL HIGH (ref 70–99)
Glucose-Capillary: 156 mg/dL — ABNORMAL HIGH (ref 70–99)
Glucose-Capillary: 157 mg/dL — ABNORMAL HIGH (ref 70–99)

## 2018-05-17 LAB — PHOSPHORUS: Phosphorus: 7.3 mg/dL — ABNORMAL HIGH (ref 2.5–4.6)

## 2018-05-17 MED ORDER — CHLORHEXIDINE GLUCONATE CLOTH 2 % EX PADS
6.0000 | MEDICATED_PAD | Freq: Every day | CUTANEOUS | Status: DC
  Administered 2018-05-17 – 2018-05-25 (×7): 6 via TOPICAL

## 2018-05-17 MED ORDER — HEPARIN SODIUM (PORCINE) 1000 UNIT/ML DIALYSIS
20.0000 [IU]/kg | INTRAMUSCULAR | Status: DC | PRN
  Filled 2018-05-17: qty 2

## 2018-05-17 MED ORDER — ANTICOAGULANT SODIUM CITRATE 4% (200MG/5ML) IV SOLN
5.0000 mL | Freq: Once | Status: AC
  Administered 2018-05-17: 2.8 mL via INTRAVENOUS
  Filled 2018-05-17: qty 5

## 2018-05-17 MED ORDER — PENTAFLUOROPROP-TETRAFLUOROETH EX AERO
1.0000 "application " | INHALATION_SPRAY | CUTANEOUS | Status: DC | PRN
  Filled 2018-05-17: qty 30

## 2018-05-17 NOTE — Progress Notes (Signed)
HD tx end    05/17/18 1328  Vital Signs  Pulse Rate (!) 101  Pulse Rate Source Monitor  Resp 20  BP (!) 198/87  BP Location Right Arm  BP Method Automatic  Patient Position (if appropriate) Lying  Oxygen Therapy  SpO2 99 %  O2 Device Room Air  During Hemodialysis Assessment  Dialysis Fluid Bolus Normal Saline  Bolus Amount (mL) 250 mL  Intra-Hemodialysis Comments Tx completed

## 2018-05-17 NOTE — Progress Notes (Signed)
OT Cancellation Note  Patient Details Name: MYA SUELL MRN: [id number] DOB: 05/03/1956   Cancelled Treatment:    Reason Eval/Treat Not Completed: Patient at procedure or test/ unavailable. Upon attempt to treat, pt receiving dialysis. Will re-attempt at later date/time as pt is available and medically appropriate. Noted to also have new temporary femoral catheter for HD.   [name], MPH, MS, OTR/L ascom [phone] 05/17/18, 2:16 PM

## 2018-05-17 NOTE — Progress Notes (Signed)
1st attempt to get report prior to HD tx   05/17/18 1045  Hand-Off documentation  Report given to (Full Name) Stark Bray  Report received from (Full Name) Harless Nakayama

## 2018-05-17 NOTE — Care Management (Signed)
PPD documented give at 9/3 at 1428.  Bedside RN notified to read.

## 2018-05-17 NOTE — Progress Notes (Signed)
To MRI via bed with orderly.

## 2018-05-17 NOTE — Progress Notes (Signed)
Pre HD assessment    05/17/18 1116  Neurological  Level of Consciousness Alert  Orientation Level Oriented to person;Oriented to place  Respiratory  Respiratory Pattern Unlabored  Chest Assessment Chest expansion symmetrical  Cardiac  ECG Monitor Yes  Cardiac Rhythm NSR;ST  Vascular  R Radial Pulse +2  L Radial Pulse +2  Edema Generalized  Integumentary  Integumentary (WDL) X  Skin Color Appropriate for ethnicity  Musculoskeletal  Musculoskeletal (WDL) X  Generalized Weakness Yes  Assistive Device None  GU Assessment  Genitourinary (WDL) X  Genitourinary Symptoms  (HD)  Psychosocial  Psychosocial (WDL) X  Patient Behaviors Cooperative;Calm;Flat affect  Needs Expressed Denies  Emotional support given Given to patient

## 2018-05-17 NOTE — Progress Notes (Signed)
Received call from MRI that patient was refusing MRI; also temperature probe still in foley from ICU, can't do MRI with it in; Barbaraann Faster, RN9:47 PM 05/17/2018

## 2018-05-17 NOTE — Progress Notes (Signed)
[facility]  ROUNDING NOTE   Subjective:   Doing fair Now moved to regular floor More alert today and able to follow simple commands  Appetite remains poor  Objective:  Vital signs in last 24 hours:  Temp:  [97.3 F (36.3 C)-99.5 F (37.5 C)] 99.5 F (37.5 C) (09/05 1115) Pulse Rate:  [92-101] 98 (09/05 1145) Resp:  [6-37] 15 (09/05 1145) BP: (143-202)/(65-94) 202/86 (09/05 1145) SpO2:  [97 %-100 %] 99 % (09/05 1145) Weight:  [82.7 kg-86.9 kg] 86.9 kg (09/05 1115)  Weight change: -3 kg Filed Weights   05/16/18 0500 05/17/18 0500 05/17/18 1115  Weight: 85.7 kg 82.7 kg 86.9 kg    Intake/Output: I/O last 3 completed shifts: In: 886.4 [I.V.:76.5; IV Piggyback:809.9] Out: 626 [Urine:875]   Intake/Output this shift:  No intake/output data recorded.  Physical Exam: General: Critically ill  Head: Dry oral mucus membranes  Eyes: Anicteric   Neck: Supple,    Lungs:  Coarse at bases,    Heart: Regular rate and rhythm, tachycardic  Abdomen:  Soft, nontender,   Extremities: + dependent peripheral edema.  Neurologic:  able to follow few basic simple cammands  Skin: No lesions  Access: Left AVF - no thrill or bruit appreciated. Left groin temp cath  Foley in place  Basic Metabolic Panel: Recent Labs  Lab 05/11/18 1922  05/12/18 0225  05/13/18 0450  05/14/18 1912 05/14/18 2304 05/15/18 0300 05/15/18 1601 05/16/18 0502 05/17/18 0530  NA 139   < > 141   < > 143   < > 145 144 146*  --  150* 148*  K 5.3*   < > 4.1   < > 3.4*   < > 3.2* 4.1 3.2* 4.5 4.0 4.5  CL 111   < > 109   < > 112*   < > 124* 116* 125*  --  118* 118*  CO2 21*   < > 22   < > 21*   < > 16* 21* 18*  --  21* 18*  GLUCOSE 192*   < > 235*   < > 106*   < > 154* 194* 151*  --  123* 153*  BUN 59*   < > 61*   < > 62*   < > 47* 59* 48*  --  62* 74*  CREATININE 5.73*   < > 5.76*   < > 5.58*   < > 4.54* 5.71* 4.37*  --  5.78* 6.50*  CALCIUM 8.0*   < > 7.9*   < > 7.5*   < > 5.9* 7.8* 6.1*  --  8.5*  8.2*  MG 1.7  --  1.9  --  1.8  --   --   --   --   --  2.1  --   PHOS 5.9*  --   --   --  4.2  --   --   --   --   --   --   --    < > = values in this interval not displayed.    Liver Function Tests: Recent Labs  Lab 05/11/18 1922 05/13/18 0450 05/14/18 2000  AST 39 19  --   ALT 32 11  --   ALKPHOS 42 37*  --   BILITOT 0.5 0.5  --   PROT 5.8* 4.7*  --   ALBUMIN 2.8* 2.4* 1.7*   No results for input(s): LIPASE, AMYLASE in the last 168 hours. No results for input(s): AMMONIA in  the last 168 hours.  CBC: Recent Labs  Lab 05/11/18 1922 05/12/18 0225 05/13/18 0450 05/14/18 0323 05/17/18 0530  WBC 12.3* 13.1* 5.7 7.2 11.1*  NEUTROABS 11.2*  --  3.7 5.2 9.4*  HGB 9.0* 8.7* 7.8* 8.6* 8.3*  HCT 27.5* 26.4* 23.1* 25.1* 25.7*  MCV 90.5 89.7 87.8 90.1 90.5  PLT 207 143* 174 170 248    Cardiac Enzymes: Recent Labs  Lab 05/11/18 1922 05/11/18 2105 05/12/18 0225 05/12/18 0818 05/12/18 1413  TROPONINI 0.03* 0.05* 0.08* 0.07* 0.06*    BNP: Invalid input(s): POCBNP  CBG: Recent Labs  Lab 05/16/18 1603 05/16/18 1926 05/16/18 2358 05/17/18 0453 05/17/18 0746  GLUCAP 136* 156* 157* 156* 150*    Microbiology: Results for orders placed or performed during the hospital encounter of 05/11/18  MRSA PCR Screening     Status: None   Collection Time: 05/11/18  7:56 PM  Result Value Ref Range Status   MRSA by PCR NEGATIVE NEGATIVE Final    Comment:        The GeneXpert MRSA Assay (FDA approved for NASAL specimens only), is one component of a comprehensive MRSA colonization surveillance program. It is not intended to diagnose MRSA infection nor to guide or monitor treatment for MRSA infections. Performed at [facility], [address] [place], [place] [address]   CULTURE, BLOOD (ROUTINE X 2) w Reflex to ID Panel     Status: None (Preliminary result)   Collection Time: 05/13/18  3:18 PM  Result Value Ref Range Status   Specimen Description BLOOD BFOA   Final   Special Requests   Final    BOTTLES DRAWN AEROBIC AND ANAEROBIC Blood Culture adequate volume   Culture   Final    NO GROWTH 4 DAYS Performed at [facility], [address] [place], [place] [address]    Report Status PENDING  Incomplete  CULTURE, BLOOD (ROUTINE X 2) w Reflex to ID Panel     Status: None (Preliminary result)   Collection Time: 05/13/18  4:33 PM  Result Value Ref Range Status   Specimen Description BLOOD L HAND  Final   Special Requests   Final    BOTTLES DRAWN AEROBIC AND ANAEROBIC Blood Culture adequate volume   Culture   Final    NO GROWTH 4 DAYS Performed at [facility], [address] [place],  [address]    Report Status PENDING  Incomplete    Coagulation Studies: No results for input(s): LABPROT, INR in the last 72 hours.  Urinalysis: No results for input(s): COLORURINE, LABSPEC, PHURINE, GLUCOSEU, HGBUR, BILIRUBINUR, KETONESUR, PROTEINUR, UROBILINOGEN, NITRITE, LEUKOCYTESUR in the last 72 hours.  Invalid input(s): APPERANCEUR    Imaging: Ct Head Wo Contrast  Result Date: 05/16/2018 CLINICAL DATA:  Altered mental status.  Recent cardiac arrest EXAM: CT HEAD WITHOUT CONTRAST TECHNIQUE: Contiguous axial images were obtained from the base of the skull through the vertex without intravenous contrast. COMPARISON:  May 11, 2018 FINDINGS: Brain: Ventricles are normal in size and configuration. There is no appreciable intracranial mass hemorrhage, extra-axial fluid collection, or midline shift. There is preservation gray-white differentiation. There is slight small vessel disease in the centra semiovale bilaterally, stable. There is no new gray-white compartment lesion. No acute infarct is demonstrable on this study. Vascular: No hyperdense vessel evident. No appreciable vascular calcification. Skull: Bony calvarium appears intact. Sinuses/Orbits: There is mucosal thickening in several ethmoid air cells. There is also mild mucosal  thickening in the sphenoid sinus regions. Other  paranasal sinuses which are visualized are clear. Visualized orbits appear symmetric bilaterally. Other: Mastoid air cells are clear. IMPRESSION: Stable slight periventricular small vessel disease. No acute infarct evident. There is preservation of gray-white differentiation. No mass or hemorrhage. Mild paranasal sinus disease. Electronically Signed   By: Lowella Grip III M.D.   On: 05/16/2018 07:49   Dg Chest Port 1 View  Result Date: 05/16/2018 CLINICAL DATA:  Airspace consolidation EXAM: PORTABLE CHEST 1 VIEW COMPARISON:  May 15, 2018 FINDINGS: There is persistent consolidation in the left lower lobe. There is atelectatic change in the medial right base. Lungs elsewhere are clear. Heart is mildly enlarged with pulmonary vascularity normal. No adenopathy. No bone lesions. Note that nasogastric tube and endotracheal tube have been removed. No pneumothorax. IMPRESSION: Airspace consolidation left lower lobe, likely due to pneumonia. Medial right base atelectasis. Lungs elsewhere clear. No pneumothorax. Stable cardiac prominence. Electronically Signed   By: Lowella Grip III M.D.   On: 05/16/2018 07:50     Medications:   . sodium chloride Stopped (05/17/18 0200)  . ceFEPime (MAXIPIME) IV Stopped (05/16/18 0934)  . metronidazole 500 mg (05/17/18 0132)   . acetylcysteine  3 mL Nebulization BID  . aspirin  81 mg Per Tube Daily  . atorvastatin  40 mg Oral q1800  . Chlorhexidine Gluconate Cloth  6 each Topical Q0600  . clopidogrel  75 mg Oral Daily  . diltiazem  120 mg Oral Daily  . docusate  100 mg Per Tube BID  . famotidine  20 mg Per Tube QHS  . ipratropium-albuterol  3 mL Nebulization QID  . sodium chloride flush  10-40 mL Intracatheter Q12H  . tuberculin  5 Units Intradermal Once   acetaminophen **OR** acetaminophen, heparin, hydrALAZINE, pentafluoroprop-tetrafluoroeth, sodium chloride flush, sorbitol  Assessment/ Plan:  Ms.  NOELL LORENSEN is a 62 y.o. black female with dialysis mellitus type II insulin dependent, hypertension, hyperlipidemia, peripheral vascular disease, chronic pancreatitis, anemia of chronic kidney disease admitted on 05/11/2018 for AVF creation by Dr. Delana Meyer. Post operative complication of cardiac arrest  1. Acute renal failure on Chronic kidney disease stage V: with metabolic acidosis. With history of nephrotic range proteinuria.  Baseline creatinine of 4.68, GFR of 11.  Chronic kidney disease secondary to diabetic nephropathy. 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 Appetite remains poor.  Discussed with patient has been that she may have underlying some uremic symptoms Discussed risks and benefits and he agreed to proceed with dialysis  Currently she has a temporary dialysis catheter.  We plan to dialyze her for a short time today and then daily progressively increasing her time with each dialysis treatment.  PermCath likely next week.  2.  Cardiac arrest: placed on hypothermic protocol.  - Appreciate cardiology input  3. Acute resp failure with volume overload - extubated now.  4. Anemia of chronic kidney disease: normocytic. Hemoglobin dropped to 8.6 Hold ESA due to ischemia  5. Diabetes mellitus type II with chronic kidney disease: insulin dependent. History of poor control. Hemoglobin A1c 6.6% on 02/26/18.   6. Hypernatremia Expected to improve once she is able to have normal oral fluid intake.   LOS: 6 Dominic Mahaney 9/5/201911:57 AM

## 2018-05-17 NOTE — Progress Notes (Addendum)
Inpatient Rehabilitation  Per PT and OT request, patient was screened by Gunnar Fusi for appropriateness for an Inpatient Acute Rehab consult.  At this time we are recommending an Inpatient Rehab consult and I have placed an order.  We are also recommending a SLP cognitive-linguistic evaluation.  Given current, clinicals anticipate that patient will need 24/7 care after a short, intensive inpatient rehab stay with [place].  Plan to for timing of medical readiness, patient/family decision, insurance approval, and IP Rehab bed availability.  Call if questions.    Carmelia Roller., CCC/SLP Admission Coordinator  [facility]  Cell [phone]

## 2018-05-17 NOTE — Progress Notes (Signed)
Patient ID: Tracy Jennings, female   DOB: Jan 14, 1956, 62 y.o.   MRN: [id number]  ACP note  Patient and husband at the bedside  Diagnosis: Acute encephalopathy which could be a combination of anoxic encephalopathy, uremia and pneumonia.  Essential hypertension, type 2 diabetes, peripheral vascular disease, hypernatremia  CODE STATUS discussed.  Patient is a full code.  Plan.  MRI of the brain to rule out stroke.  Patient on aspirin.  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  For uremia, dialysis should help.  For pneumonia antibiotics will help.  Continue to watch mental status.  Time spent on ACP discussion 17 minutes Dr. Loletha Grayer

## 2018-05-17 NOTE — H&P (Signed)
[place] [facility] [place] NAME: Laura Caldas    MR#:  [id number]  DATE OF BIRTH:  06-13-1956  DATE OF ADMISSION:  05/11/2018  PRIMARY CARE PHYSICIAN: Johny Drilling  REQUESTING/REFERRING PHYSICIAN: Dr. Murlean Iba  CHIEF COMPLAINT:  Asked to take over care after ICU hospital stay.  HISTORY OF PRESENT ILLNESS:  Tracy Jennings  is a 62 y.o. female with a known history of chronic kidney disease, peripheral vascular disease, diabetes, anemia and history of pancreatitis.  On 05/11/2018 she had an elective fistula placement to get ready for dialysis.  After this event they did CPR briefly and needed to be intubated and was in the ICU.  She was followed by the critical care team, neurology, nephrology and cardiology while in the ICU.  She was transferred out of the ICU and hospitalist were consulted to take over care.  As per the nursing staff and husband at the bedside the patient is not her usual self.  She is very lethargic.  Her speech is off and her swallowing is off.  She is slow with following commands.  PAST MEDICAL HISTORY:   Past Medical History:  Diagnosis Date  . Anemia 04/2018   low iron. to be started on supplements  . CKD (chronic kidney disease)    Stage IV  . Diabetes mellitus without complication ([place])    type II  . ESRD (end stage renal disease) ([place])   . Heart murmur    followed as a child only  . Hyperlipidemia associated with type 2 diabetes mellitus ([place])   . Hypertension   . Pancreatitis   . Peripheral vascular disease ([place])     PAST SURGICAL HISTORY:   Past Surgical History:  Procedure Laterality Date  . AMPUTATION TOE Left 2013   2nd toe. tip of toe (toe nail was infected)  . AV FISTULA PLACEMENT Left 05/11/2018   Procedure: ARTERIOVENOUS (AV) FISTULA CREATION;  Surgeon: Katha Cabal, MD;  Location: [facility] ORS;  Service: Vascular;  Laterality: Left;  . CATARACT EXTRACTION    . CHOLECYSTECTOMY  2014  .  COLONOSCOPY    . COLONOSCOPY WITH PROPOFOL N/A 01/10/2018   Procedure: COLONOSCOPY WITH PROPOFOL;  Surgeon: Toledo, Benay Pike, MD;  Location: [facility] ENDOSCOPY;  Service: Gastroenterology;  Laterality: N/A;  . EYE SURGERY Bilateral 2018   cataract extractions  . THROMBECTOMY W/ EMBOLECTOMY  05/11/2018   Procedure: THROMBECTOMY ARTERIOVENOUS FISTULA;  Surgeon: Katha Cabal, MD;  Location: [facility] ORS;  Service: Vascular;;  . TUBAL LIGATION  1984    SOCIAL HISTORY:   Social History   Tobacco Use  . Smoking status: Never Smoker  . Smokeless tobacco: Never Used  Substance Use Topics  . Alcohol use: No    FAMILY HISTORY:   Family History  Problem Relation Age of Onset  . Stroke Mother   . Hypertension Mother   . Gout Mother   . Cancer Father   . Diabetes Sister   . Diabetes Maternal Grandmother   . Diabetes Maternal Grandfather   . Diabetes Paternal Grandmother   . Diabetes Paternal Grandfather     DRUG ALLERGIES:   Allergies  Allergen Reactions  . Hctz [Hydrochlorothiazide]     pancreatitis  . Amlodipine Swelling    Knees down to ankles  . Amlodipine Besylate Swelling  . Sulfa Antibiotics Rash  . Dairy Aid [Lactase]     Runny nose    REVIEW OF SYSTEMS:  CONSTITUTIONAL: No fever, chills  or sweats.  Positive for fatigue.  EYES: No blurred or double vision.  EARS, NOSE, AND THROAT: No tinnitus or ear pain. No sore throat RESPIRATORY: No cough, shortness of breath, wheezing or hemoptysis.  CARDIOVASCULAR: No chest pain, orthopnea, edema.  GASTROINTESTINAL: No nausea, vomiting, diarrhea or abdominal pain. No blood in bowel movements GENITOURINARY: No dysuria, hematuria.  ENDOCRINE: No polyuria, nocturia,  HEMATOLOGY: No anemia, easy bruising or bleeding SKIN: No rash or lesion. MUSCULOSKELETAL: No joint pain or arthritis.   NEUROLOGIC: No tingling, numbness, weakness.  PSYCHIATRY: No anxiety or depression.   MEDICATIONS AT HOME:   Prior to Admission medications    Medication Sig Start Date End Date Taking? Authorizing Provider  atorvastatin (LIPITOR) 40 MG tablet Take 40 mg by mouth daily at 6 PM.  10/05/15 05/11/18 Yes [provider]  bumetanide (BUMEX) 1 MG tablet Take 2 mg by mouth 2 (two) times daily.  02/28/18 02/28/19 Yes [provider]  carvedilol (COREG CR) 40 MG 24 hr capsule Take 40 mg by mouth daily. Takes in the morning when she gets home from work 11/06/17  Yes [provider]  diltiazem (CARDIZEM LA) 120 MG 24 hr tablet Take 1 tablet (120 mg total) by mouth daily. Patient taking differently: Take 120 mg by mouth daily. Takes in the morning when she gets home from work 08/08/17  Yes Cuthriell, Charline Bills, PA-C  fluticasone (FLONASE) 50 MCG/ACT nasal spray 2 sprays by Each Nare route daily. 01/15/18 01/15/19 Yes [provider]  hydrALAZINE (APRESOLINE) 50 MG tablet Take 50 mg by mouth every 8 (eight) hours.  10/24/17  Yes [provider]  Insulin Glargine 300 UNIT/ML SOPN Inject 40 Units into the skin daily. In the morning.  (toujeo) 11/30/15  Yes [provider]  amLODipine (NORVASC) 5 MG tablet Take 1 tablet by mouth once daily 01/18/16   [provider]  aspirin EC 81 MG tablet Take 81 mg by mouth daily. 09/27/17   [provider]  HYDROcodone-acetaminophen (NORCO) 5-325 MG tablet Take 1-2 tablets by mouth every 6 (six) hours as needed for moderate pain or severe pain. 05/11/18   Schnier, Dolores Lory, MD  insulin aspart (NOVOLOG) 100 UNIT/ML FlexPen Inject 10-12 Units into the skin 3 (three) times daily with meals.  12/28/15   [provider]  losartan (COZAAR) 100 MG tablet Take 1 tablet by mouth once daily 01/18/16   [provider]  metoprolol (LOPRESSOR) 50 MG tablet Take 50 mg by mouth daily.  02/20/15   [provider]  ondansetron (ZOFRAN) 4 MG tablet Take 1 tablet (4 mg total) by mouth every 6 (six) hours as needed for nausea. Patient not taking: Reported  on 05/04/2018 11/03/17   Epifanio Lesches, MD  oxyCODONE (OXY IR/ROXICODONE) 5 MG immediate release tablet Take 1 tablet (5 mg total) by mouth every 4 (four) hours as needed for moderate pain. Patient not taking: Reported on 05/04/2018 11/03/17   Epifanio Lesches, MD  spironolactone (ALDACTONE) 50 MG tablet Take 50 mg by mouth daily. 09/27/17   [provider]  Vitamin D, Ergocalciferol, (DRISDOL) 50000 units CAPS capsule TAKE ONE CAPSULE BY MOUTH ONE TIME PER WEEK 01/06/18   [provider]      VITAL SIGNS:  Blood pressure (!) 143/65, pulse (!) 101, temperature 99 F (37.2 C), temperature source Oral, resp. rate 20, height 5' 3.5" (1.613 m), weight 82.7 kg, SpO2 98 %.  PHYSICAL EXAMINATION:  GENERAL:  62 y.o.-year-old patient lying in the  bed with no acute distress.  EYES: Pupils equal, round, reactive to light and accommodation. No scleral icterus. Extraocular muscles intact.  HEENT: Head atraumatic, normocephalic. Oropharynx and nasopharynx clear.  NECK:  Supple, no jugular venous distention. No thyroid enlargement, no tenderness.  LUNGS: Decreased breath sounds bilateral bases, no wheezing, rales,rhonchi or crepitation. No use of accessory muscles of respiration.  CARDIOVASCULAR: S1, S2 tachycardic. No murmurs, rubs, or gallops.  ABDOMEN: Soft, nontender, nondistended. Bowel sounds present. No organomegaly or mass.  EXTREMITIES: Trace pedal edema, no cyanosis, or clubbing.  NEUROLOGIC: Cranial nerves II through XII are intact. Muscle strength 4/5 in all extremities. Sensation intact. Gait not checked.  PSYCHIATRIC: The patient is alert and slow to respond and falls asleep easily.Marland Kitchen  SKIN: No rash, lesion, or ulcer.   LABORATORY PANEL:   CBC Recent Labs  Lab 05/17/18 0530  WBC 11.1*  HGB 8.3*  HCT 25.7*  PLT 248   ------------------------------------------------------------------------------------------------------------------  Chemistries  Recent Labs   Lab 05/13/18 0450  05/16/18 0502 05/17/18 0530  NA 143   < > 150* 148*  K 3.4*   < > 4.0 4.5  CL 112*   < > 118* 118*  CO2 21*   < > 21* 18*  GLUCOSE 106*   < > 123* 153*  BUN 62*   < > 62* 74*  CREATININE 5.58*   < > 5.78* 6.50*  CALCIUM 7.5*   < > 8.5* 8.2*  MG 1.8  --  2.1  --   AST 19  --   --   --   ALT 11  --   --   --   ALKPHOS 37*  --   --   --   BILITOT 0.5  --   --   --    < > = values in this interval not displayed.   ------------------------------------------------------------------------------------------------------------------  Cardiac Enzymes Recent Labs  Lab 05/12/18 1413  TROPONINI 0.06*   ------------------------------------------------------------------------------------------------------------------  RADIOLOGY:  Ct Head Wo Contrast  Result Date: 05/16/2018 CLINICAL DATA:  Altered mental status.  Recent cardiac arrest EXAM: CT HEAD WITHOUT CONTRAST TECHNIQUE: Contiguous axial images were obtained from the base of the skull through the vertex without intravenous contrast. COMPARISON:  May 11, 2018 FINDINGS: Brain: Ventricles are normal in size and configuration. There is no appreciable intracranial mass hemorrhage, extra-axial fluid collection, or midline shift. There is preservation gray-white differentiation. There is slight small vessel disease in the centra semiovale bilaterally, stable. There is no new gray-white compartment lesion. No acute infarct is demonstrable on this study. Vascular: No hyperdense vessel evident. No appreciable vascular calcification. Skull: Bony calvarium appears intact. Sinuses/Orbits: There is mucosal thickening in several ethmoid air cells. There is also mild mucosal thickening in the sphenoid sinus regions. Other paranasal sinuses which are visualized are clear. Visualized orbits appear symmetric bilaterally. Other: Mastoid air cells are clear. IMPRESSION: Stable slight periventricular small vessel disease. No acute infarct  evident. There is preservation of gray-white differentiation. No mass or hemorrhage. Mild paranasal sinus disease. Electronically Signed   By: Lowella Grip III M.D.   On: 05/16/2018 07:49   Dg Chest Port 1 View  Result Date: 05/16/2018 CLINICAL DATA:  Airspace consolidation EXAM: PORTABLE CHEST 1 VIEW COMPARISON:  May 15, 2018 FINDINGS: There is persistent consolidation in the left lower lobe. There is atelectatic change in the medial right base. Lungs elsewhere are clear. Heart is mildly enlarged with pulmonary vascularity normal. No adenopathy. No bone lesions. Note that nasogastric tube  and endotracheal tube have been removed. No pneumothorax. IMPRESSION: Airspace consolidation left lower lobe, likely due to pneumonia. Medial right base atelectasis. Lungs elsewhere clear. No pneumothorax. Stable cardiac prominence. Electronically Signed   By: Lowella Grip III M.D.   On: 05/16/2018 07:50    EKG:   Last EKG showed normal sinus rhythm 69 bpm, QTC 512  IMPRESSION AND PLAN:   1.  Acute encephalopathy.  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  We will get an MRI of the brain.  Patient on aspirin.  This could be uremia from worsening chronic kidney disease.  Dialysis should help.  This could be from acute infectious process with pneumonia.  Patient on antibiotics. 2.  Uremia, with chronic kidney disease stage V with metabolic acidosis..  Nephrology to start dialysis.  Will need a PermCath prior to disposition.  Because of her event after a surgery, the PermCath likely will not be done until next week. 3.  Pneumonia on cefepime and Flagyl 4.  Essential hypertension.  Patient on quite a few medications for blood pressure. 5.  Type 2 diabetes mellitus.  Sugars have been controlled on current regimen 6.  Peripheral vascular disease on aspirin 7.  Hypernatremia.  Likely due to not eating very well over the past few days.  Speech pathology  upgraded diet to dysphagia diet with nectar thick liquids.  Dialysis can also handle the electrolyte abnormalities.    All the records are reviewed and case discussed with nephrology provider. Management plans discussed with the patient, family and they are in agreement.  CODE STATUS: Full code  TOTAL TIME TAKING CARE OF THIS PATIENT: 50 minutes, including acp time.    Loletha Grayer M.D on 05/17/2018 at 10:45 AM  Between 7am to 6pm - Pager - [phone]  After 6pm call admission pager [phone]  [facility]  [phone]  CC: Primary care physician; Dr. Johny Drilling

## 2018-05-17 NOTE — Progress Notes (Signed)
Report given to [place], Therapist, sports. Hemodialysis cath placed before transport. IV team consulted to obtain another PIV. Per Yevonne Aline, NP may remove central line after another PIV obtained. Marcella, RN made aware in report.  Wilnette Kales

## 2018-05-17 NOTE — Progress Notes (Signed)
HD tx start    05/17/18 1125  Vital Signs  Pulse Rate 96  Pulse Rate Source Monitor  Resp 11  BP (!) 198/86  BP Location Right Arm  BP Method Automatic  Patient Position (if appropriate) Lying  Oxygen Therapy  SpO2 97 %  O2 Device Room Air  During Hemodialysis Assessment  Blood Flow Rate (mL/min) 200 mL/min  Arterial Pressure (mmHg) -50 mmHg  Venous Pressure (mmHg) 50 mmHg  Transmembrane Pressure (mmHg) 40 mmHg  Ultrafiltration Rate (mL/min) 250 mL/min  Dialysate Flow Rate (mL/min) 300 ml/min  Conductivity: Machine  13.8  HD Safety Checks Performed Yes  Dialysis Fluid Bolus Normal Saline  Bolus Amount (mL) 250 mL  Intra-Hemodialysis Comments Tx initiated  Hemodialysis Catheter Left Femoral vein Triple-lumen  Placement Date/Time: 05/17/18 0300   Time Out: Correct patient;Correct procedure;Correct site  Maximum sterile barrier precautions: Hand hygiene;Sterile gown;Cap;Sterile gloves;Mask;Large sterile sheet  Site Prep: Chlorhexidine;Skin Prep Completely Dr...  Blue Lumen Status Infusing  Red Lumen Status Infusing

## 2018-05-17 NOTE — Progress Notes (Signed)
Post HD assessment    05/17/18 1334  Neurological  Level of Consciousness Alert  Orientation Level Oriented to person;Oriented to place  Respiratory  Respiratory Pattern Unlabored  Chest Assessment Chest expansion symmetrical  Cardiac  ECG Monitor Yes  Cardiac Rhythm NSR;ST  Vascular  R Radial Pulse +2  L Radial Pulse +2  Edema Generalized  Integumentary  Integumentary (WDL) X  Skin Color Appropriate for ethnicity  Musculoskeletal  Musculoskeletal (WDL) X  Generalized Weakness Yes  Assistive Device None  GU Assessment  Genitourinary (WDL) X  Genitourinary Symptoms  (HD)  Psychosocial  Psychosocial (WDL) X  Patient Behaviors Cooperative;Calm;Flat affect  Needs Expressed Denies  Emotional support given Given to patient

## 2018-05-17 NOTE — Procedures (Signed)
Hemodialysis Catheter Insertion Procedure Note Tracy Jennings [id number] [birth year]  Procedure: Insertion of Hemodialysis Catheter Indications: Hemodialysis  Procedure Details Consent: Risks of procedure as well as the alternatives and risks of each were explained to the (patient/caregiver).  Consent for procedure obtained.  Time Out: Verified patient identification, verified procedure, site/side was marked, verified correct patient position, special equipment/implants available, medications/allergies/relevent history reviewed, required imaging and test results available.  Performed  Maximum sterile technique was used including antiseptics, cap, gloves, gown, hand hygiene, mask and sheet.  Skin prep: Chlorhexidine; local anesthetic administered  A Trialysis HD catheter was placed in the left femoral vein due to patient being a dialysis patient using the Seldinger technique.  Evaluation Blood flow good Complications: No apparent complications Patient did tolerate procedure well. Chest X-ray ordered to verify placement.  CXR: Not applicabe, placed in right femoral vein.   Procedure performed under direct supervision of Marda Stalker NP and with ultrasound guidance for real time vessel cannulation.     Tracy Jennings, AGACNP-BC [facility] Pager: [phone]  05/17/2018, 2:44 AM

## 2018-05-17 NOTE — Progress Notes (Signed)
Physical Therapy Treatment Patient Details Name: Tracy Jennings MRN: [id number] DOB: [birth year] Today's Date: 05/17/2018    History of Present Illness Pt admitted for complication for dialysis access. Pt initially admitted on 8/30 for placement for AV fistula and then had complication for thrombosis post op, went back for thrombectomy and had code blue requiring CPR requiring intubation. Pt extubated on 9/3. Per chart review, looks that pt's fistula is now occluded. HIstory includes CKD, DM, ESRD, HTN, PVD, and pancreatitis.     PT Comments    Pt with new temp dialysis cath RLE. Participated in exercises as described below.   Session short as transportation to dialysis in room.   Follow Up Recommendations  CIR     Equipment Recommendations  Rolling walker with 5" wheels    Recommendations for Other Services Rehab consult     Precautions / Restrictions Precautions Precautions: Fall Restrictions Weight Bearing Restrictions: No Other Position/Activity Restrictions: Temp dialysis cath R LE    Mobility  Bed Mobility               General bed mobility comments: deferred mobility due to temp dialysis cath RLE   Transfers                    Ambulation/Gait                 Stairs             Wheelchair Mobility    Modified Rankin (Stroke Patients Only)       Balance                                            Cognition Arousal/Alertness: Lethargic   Overall Cognitive Status: Impaired/Different from baseline Area of Impairment: Orientation;Memory;Following commands                 Orientation Level: Disoriented to;Time;Situation   Memory: Decreased short-term memory Following Commands: Follows one step commands inconsistently              Exercises Other Exercises Other Exercises: BLE ankle pumps and quad sets, LLE only for heel slides, ab/add, SLR, SAQ  2 x 10    General Comments         Pertinent Vitals/Pain Pain Assessment: No/denies pain    Home Living                      Prior Function            PT Goals (current goals can now be found in the care plan section) Progress towards PT goals: Progressing toward goals    Frequency    7X/week      PT Plan Current plan remains appropriate    Co-evaluation              AM-PAC PT "6 Clicks" Daily Activity  Outcome Measure  Difficulty turning over in bed (including adjusting bedclothes, sheets and blankets)?: Unable Difficulty moving from lying on back to sitting on the side of the bed? : Unable   Help needed moving to and from a bed to chair (including a wheelchair)?: Total Help needed walking in hospital room?: Total Help needed climbing 3-5 steps with a railing? : Total 6 Click Score: 5    End of Session Equipment Utilized During Treatment: Gait belt Activity Tolerance: Patient  tolerated treatment well Patient left: in bed;with bed alarm set;with nursing/sitter in room;with call bell/phone within reach;with family/visitor present         Time: 4417-1278 PT Time Calculation (min) (ACUTE ONLY): 19 min  Charges:  $Therapeutic Exercise: 8-22 mins                    [name], PTA 05/17/18, 11:22 AM

## 2018-05-17 NOTE — Progress Notes (Signed)
Subjective: Patient has continued to improve but has not returned to baseline  Objective: Current vital signs: BP (!) 202/79 (BP Location: Right Arm)   Pulse (!) 103   Temp 99 F (37.2 C) (Oral)   Resp 14   Ht 5' 3.5" (1.613 m)   Wt 86.9 kg   SpO2 99%   BMI 33.40 kg/m  Vital signs in last 24 hours: Temp:  [97.3 F (36.3 C)-99.5 F (37.5 C)] 99 F (37.2 C) (09/05 1356) Pulse Rate:  [92-103] 103 (09/05 1356) Resp:  [6-21] 14 (09/05 1356) BP: (143-207)/(65-88) 202/79 (09/05 1356) SpO2:  [97 %-100 %] 99 % (09/05 1356) Weight:  [82.7 kg-86.9 kg] 86.9 kg (09/05 1335)  Intake/Output from previous day: 09/04 0701 - 09/05 0700 In: 614.3 [IV Piggyback:614.3] Out: 450 [Urine:450] Intake/Output this shift: Total I/O In: -  Out: 7 [Other:7] Nutritional status:  Diet Order            DIET - DYS 1 Room service appropriate? Yes; Fluid consistency: Nectar Thick  Diet effective now              Neurologic Exam: Mental Status: Alert.  Unable to tell me where she is but when given choices is able to recall she is in the hospital.  Follows simple commands.   Cranial Nerves: II: Visual fields grossly normal III,IV, VI: extra-ocular motions intact bilaterally V,VII: smile symmetric VIII: hearing normal bilaterally IX,X: gag reflex present XI: bilateral shoulder shrug XII: midline tongue extension Motor: Moves all extremities against gravity.  Twitching noted in the LUE   Lab Results: Basic Metabolic Panel: Recent Labs  Lab 05/11/18 1922  05/12/18 0225  05/13/18 0450  05/14/18 1912 05/14/18 2304 05/15/18 0300 05/15/18 1601 05/16/18 0502 05/17/18 0530 05/17/18 1144  NA 139   < > 141   < > 143   < > 145 144 146*  --  150* 148*  --   K 5.3*   < > 4.1   < > 3.4*   < > 3.2* 4.1 3.2* 4.5 4.0 4.5  --   CL 111   < > 109   < > 112*   < > 124* 116* 125*  --  118* 118*  --   CO2 21*   < > 22   < > 21*   < > 16* 21* 18*  --  21* 18*  --   GLUCOSE 192*   < > 235*   < > 106*    < > 154* 194* 151*  --  123* 153*  --   BUN 59*   < > 61*   < > 62*   < > 47* 59* 48*  --  62* 74*  --   CREATININE 5.73*   < > 5.76*   < > 5.58*   < > 4.54* 5.71* 4.37*  --  5.78* 6.50*  --   CALCIUM 8.0*   < > 7.9*   < > 7.5*   < > 5.9* 7.8* 6.1*  --  8.5* 8.2*  --   MG 1.7  --  1.9  --  1.8  --   --   --   --   --  2.1  --   --   PHOS 5.9*  --   --   --  4.2  --   --   --   --   --   --   --  7.3*   < > = values  in this interval not displayed.    Liver Function Tests: Recent Labs  Lab 05/11/18 1922 05/13/18 0450 05/14/18 2000  AST 39 19  --   ALT 32 11  --   ALKPHOS 42 37*  --   BILITOT 0.5 0.5  --   PROT 5.8* 4.7*  --   ALBUMIN 2.8* 2.4* 1.7*   No results for input(s): LIPASE, AMYLASE in the last 168 hours. No results for input(s): AMMONIA in the last 168 hours.  CBC: Recent Labs  Lab 05/11/18 1922 05/12/18 0225 05/13/18 0450 05/14/18 0323 05/17/18 0530  WBC 12.3* 13.1* 5.7 7.2 11.1*  NEUTROABS 11.2*  --  3.7 5.2 9.4*  HGB 9.0* 8.7* 7.8* 8.6* 8.3*  HCT 27.5* 26.4* 23.1* 25.1* 25.7*  MCV 90.5 89.7 87.8 90.1 90.5  PLT 207 143* 174 170 248    Cardiac Enzymes: Recent Labs  Lab 05/11/18 1922 05/11/18 2105 05/12/18 0225 05/12/18 0818 05/12/18 1413  TROPONINI 0.03* 0.05* 0.08* 0.07* 0.06*    Lipid Panel: No results for input(s): CHOL, TRIG, HDL, CHOLHDL, VLDL, LDLCALC in the last 168 hours.  CBG: Recent Labs  Lab 05/16/18 2358 05/17/18 0453 05/17/18 0746 05/17/18 1424 05/17/18 1617  GLUCAP 157* 156* 150* 121* 121*    Microbiology: Results for orders placed or performed during the hospital encounter of 05/11/18  MRSA PCR Screening     Status: None   Collection Time: 05/11/18  7:56 PM  Result Value Ref Range Status   MRSA by PCR NEGATIVE NEGATIVE Final    Comment:        The GeneXpert MRSA Assay (FDA approved for NASAL specimens only), is one component of a comprehensive MRSA colonization surveillance program. It is not intended to diagnose  MRSA infection nor to guide or monitor treatment for MRSA infections. Performed at [facility], [address] [place], [place] [address]   CULTURE, BLOOD (ROUTINE X 2) w Reflex to ID Panel     Status: None (Preliminary result)   Collection Time: 05/13/18  3:18 PM  Result Value Ref Range Status   Specimen Description BLOOD BFOA  Final   Special Requests   Final    BOTTLES DRAWN AEROBIC AND ANAEROBIC Blood Culture adequate volume   Culture   Final    NO GROWTH 4 DAYS Performed at [facility], [address] [place], [place] [address]    Report Status PENDING  Incomplete  CULTURE, BLOOD (ROUTINE X 2) w Reflex to ID Panel     Status: None (Preliminary result)   Collection Time: 05/13/18  4:33 PM  Result Value Ref Range Status   Specimen Description BLOOD L HAND  Final   Special Requests   Final    BOTTLES DRAWN AEROBIC AND ANAEROBIC Blood Culture adequate volume   Culture   Final    NO GROWTH 4 DAYS Performed at [facility], [address], [address] [place], [place] [address]    Report Status PENDING  Incomplete    Coagulation Studies: No results for input(s): LABPROT, INR in the last 72 hours.  Imaging: Ct Head Wo Contrast  Result Date: 05/16/2018 CLINICAL DATA:  Altered mental status.  Recent cardiac arrest EXAM: CT HEAD WITHOUT CONTRAST TECHNIQUE: Contiguous axial images were obtained from the base of the skull through the vertex without intravenous contrast. COMPARISON:  May 11, 2018 FINDINGS: Brain: Ventricles are normal in size and configuration. There is no appreciable intracranial mass hemorrhage, extra-axial fluid collection, or midline shift. There is preservation gray-white  differentiation. There is slight small vessel disease in the centra semiovale bilaterally, stable. There is no new gray-white compartment lesion. No acute infarct is demonstrable on this study. Vascular: No hyperdense vessel evident. No appreciable vascular calcification.  Skull: Bony calvarium appears intact. Sinuses/Orbits: There is mucosal thickening in several ethmoid air cells. There is also mild mucosal thickening in the sphenoid sinus regions. Other paranasal sinuses which are visualized are clear. Visualized orbits appear symmetric bilaterally. Other: Mastoid air cells are clear. IMPRESSION: Stable slight periventricular small vessel disease. No acute infarct evident. There is preservation of gray-white differentiation. No mass or hemorrhage. Mild paranasal sinus disease. Electronically Signed   By: Lowella Grip III M.D.   On: 05/16/2018 07:49   Dg Chest Port 1 View  Result Date: 05/16/2018 CLINICAL DATA:  Airspace consolidation EXAM: PORTABLE CHEST 1 VIEW COMPARISON:  May 15, 2018 FINDINGS: There is persistent consolidation in the left lower lobe. There is atelectatic change in the medial right base. Lungs elsewhere are clear. Heart is mildly enlarged with pulmonary vascularity normal. No adenopathy. No bone lesions. Note that nasogastric tube and endotracheal tube have been removed. No pneumothorax. IMPRESSION: Airspace consolidation left lower lobe, likely due to pneumonia. Medial right base atelectasis. Lungs elsewhere clear. No pneumothorax. Stable cardiac prominence. Electronically Signed   By: Lowella Grip III M.D.   On: 05/16/2018 07:50    Medications:  I have reviewed the patient's current medications. Scheduled: . acetylcysteine  3 mL Nebulization BID  . aspirin  81 mg Per Tube Daily  . atorvastatin  40 mg Oral q1800  . Chlorhexidine Gluconate Cloth  6 each Topical Q0600  . clopidogrel  75 mg Oral Daily  . diltiazem  120 mg Oral Daily  . docusate  100 mg Per Tube BID  . famotidine  20 mg Per Tube QHS  . ipratropium-albuterol  3 mL Nebulization QID  . sodium chloride flush  10-40 mL Intracatheter Q12H    Assessment/Plan: Patient improved but not at baseline. Husband reports that she continues to improve.  Head CT repeated and shows  no acute changes.  MRI of the brain pending.  Multiple metabolic abnormalities noted including hypernatremia and worsening renal function.    Will continue to follow with you   LOS: 6 days   Alexis Goodell, MD Neurology [phone] 05/17/2018  5:42 PM

## 2018-05-17 NOTE — Progress Notes (Signed)
Pre HD assessment   05/17/18 1115  Vital Signs  Temp 99.5 F (37.5 C)  Temp Source Oral  Pulse Rate 98  Pulse Rate Source Monitor  Resp 14  BP (!) 186/80  BP Location Right Arm  BP Method Automatic  Patient Position (if appropriate) Lying  Oxygen Therapy  SpO2 98 %  O2 Device Room Air  Pain Assessment  Pain Scale 0-10  Pain Score 0  PAINAD (Pain Assessment in Advanced Dementia)  Breathing 0  Negative Vocalization 0  Facial Expression 0  Body Language 0  Consolability 0  PAINAD Score 0  POSS Scale ([place] Opioid Sedation Scale)  POSS *See Group Information* 1-Acceptable,Awake and alert  Dialysis Weight  Weight 86.9 kg  Type of Weight Pre-Dialysis  Time-Out for Hemodialysis  What Procedure? HD  Pt Identifiers(min of two) First/Last Name;MRN/Account#  Correct Site? Yes  Correct Side? Yes  Correct Procedure? Yes  Consents Verified? Yes  Rad Studies Available? N/A  Safety Precautions Reviewed? Yes  Research scientist (physical sciences)  (3A)  Station Number 1  UF/Alarm Test Passed  Conductivity: Meter 13.8  Conductivity: Machine  13.8  pH 7.6  Reverse Osmosis main  Normal Saline Lot Number [id number]  Dialyzer Lot Number [id number]  Disposable Set Lot Number [id number]  Machine Temperature 98.6 F (37 C)  Musician and Audible Yes  Blood Lines Intact and Secured Yes  Pre Treatment Patient Checks  Vascular access used during treatment Catheter  Hepatitis B Surface Antigen Results  (unk)  Hepatitis B Surface Antibody  (unk )  Date Hepatitis B Surface Antibody Drawn 05/17/18  Hemodialysis Consent Verified Yes  Hemodialysis Standing Orders Initiated Yes  ECG (Telemetry) Monitor On Yes  Prime Ordered Normal Saline  Length of  DialysisTreatment -hour(s) 2 Hour(s)  Dialyzer Elisio 17H NR  Dialysate 2K, 2.5 Ca  Dialysis Anticoagulant None  Dialysate Flow Ordered 300  Blood Flow Rate Ordered 200 mL/min  Ultrafiltration Goal 0 Liters  Pre Treatment Labs  Phosphorus;Hepatitis B Surface Antigen (HBSAB, HBCAB, HCSAB )  Dialysis Blood Pressure Support Ordered Normal Saline  Education / Care Plan  Dialysis Education Provided Yes  Documented Education in Care Plan Yes  Hemodialysis Catheter Left Femoral vein Triple-lumen  Placement Date/Time: 05/17/18 0300   Time Out: Correct patient;Correct procedure;Correct site  Maximum sterile barrier precautions: Hand hygiene;Sterile gown;Cap;Sterile gloves;Mask;Large sterile sheet  Site Prep: Chlorhexidine;Skin Prep Completely Dr...  Site Condition No complications  Blue Lumen Status Heparin locked  Red Lumen Status Heparin locked  Purple Lumen Status N/A  Dressing Type Biopatch  Dressing Status Clean;Dry;Intact  Drainage Description None

## 2018-05-17 NOTE — Progress Notes (Signed)
  Speech Language Pathology Treatment: Dysphagia  Patient Details Name: Tracy Jennings MRN: [id number] DOB: Mar 12, 1956 Today's Date: 05/17/2018 Time: 0932-1020 SLP Time Calculation (min) (ACUTE ONLY): 48 min  Assessment / Plan / Recommendation Clinical Impression  Patient observed lying in bed with fluctuating alertness. Husband present at bedside. Thorough education provided to husband re: rationale for patient's current diet, risk factors for aspiration, purpose of skilled ST services, and recommended safe swallowing strategies (posted in patient's room). Husband verbalized understanding. Per husbands report, she consumed about 50% of puree diet and entire nectar-thick milk last night with x1 coughing episode, however seemed to tolerate well. SLP provided verbal instruction for safe feeding when providing 1:1 assistance during meals. Patient's alertness increased as session progressed and agreed to PO trials. Given postural modifications in bed for upright positioning and oral care , SLP provided PO trials of Magic Cup and cup sips of nectar thick liquid with adequate oral acceptance, x1 anterior oral spillage with liquids, mild residue of magic cup, mild labored breathing (however reported no SOB and/or difficulty breathing), and no observed coughing/choking. Oral care provided again before given ice chips (at patients request). Pt did require verbal/visual/tactile cues to maintain alertness during session with cognitive feeding deficits still present. Observations of perseveration still present (I.e. Stating "Thank you, Jesus" and "yes ma'am" occasionally at inappropriate times). D/t lethargy, PO trials ceased. SLP recommended to patient and husband re: assisting feeding/PO intake during times of alertness (to decrease risk of aspiration when lethargic) and smaller meals/snacks throughout the day when alert in order to enhance safe nutrition/hydration. Husband verbalized agreement and asked appropriate  questions to SLP. NSG present towards end of session and education provided to her as well re: remaining on puree/nectar thick liquid diet with previously recommend aspiration precautions. NSG verbalized understanding. Will continue to monitor and upgrade as appropriate.   HPI HPI: Patient is a 62 y.o. female with PMHx of Anemia (04/2018), CKD (chronic kidney disease), Diabetes mellitus without complication ([place]), ESRD (end stage renal disease) ([place]), Heart murmur, Hyperlipidemia associated with type 2 diabetes mellitus ([place]), Hypertension, Pancreatitis, and Peripheral vascular disease ([place]). Post operative complication of cardiac arrest with physician report stating "Atelectasis and pneumonia with possible aspiration s/p cardiac arrest.  Improved bibasilar airspace disease.  MRSA PCR negative". Altered mental status however "No acute infarct on CT head 05/16/2018". Patient extubated on 05/15/18.      SLP Plan  Continue with current plan of care       Recommendations  Diet recommendations: Dysphagia 1 (puree);Nectar-thick liquid Liquids provided via: Cup Medication Administration: Crushed with puree Supervision: Staff to assist with self feeding;Full supervision/cueing for compensatory strategies;Trained caregiver to feed patient Compensations: Minimize environmental distractions;Slow rate;Small sips/bites;Monitor for anterior loss;Follow solids with liquid Postural Changes and/or Swallow Maneuvers: Seated upright 90 degrees                Oral Care Recommendations: Oral care before and after PO;Staff/trained caregiver to provide oral care;Oral care prior to ice chip/H20 SLP Visit Diagnosis: Dysphagia, oropharyngeal phase (R13.12) Plan: Continue with current plan of care                     [name], M.S. CCC-SLP Speech-Language Pathologist  [name] 05/17/2018, 10:39 AM

## 2018-05-17 NOTE — Progress Notes (Signed)
Post HD assessment. Pt tolerated tx well without c/o or complication. Net UF 26m, goal met.    05/17/18 1335  Vital Signs  Temp 99.5 F (37.5 C)  Temp Source Oral  Pulse Rate (!) 103  Pulse Rate Source Monitor  Resp 19  BP (!) 193/74  BP Location Right Arm  BP Method Automatic  Patient Position (if appropriate) Lying  Oxygen Therapy  SpO2 99 %  O2 Device Room Air  Dialysis Weight  Weight 86.9 kg  Type of Weight Post-Dialysis  Post-Hemodialysis Assessment  Rinseback Volume (mL) 250 mL  KECN 24 V  Dialyzer Clearance Lightly streaked  Duration of HD Treatment -hour(s) 2 hour(s)  Hemodialysis Intake (mL) 500 mL  UF Total -Machine (mL) 507 mL  Net UF (mL) 7 mL  Tolerated HD Treatment Yes  Education / Care Plan  Dialysis Education Provided Yes  Documented Education in Care Plan Yes  Hemodialysis Catheter Left Femoral vein Triple-lumen  Placement Date/Time: 05/17/18 0300   Time Out: Correct patient;Correct procedure;Correct site  Maximum sterile barrier precautions: Hand hygiene;Sterile gown;Cap;Sterile gloves;Mask;Large sterile sheet  Site Prep: Chlorhexidine;Skin Prep Completely Dr...  Site Condition No complications  Blue Lumen Status Heparin locked  Red Lumen Status Heparin locked  Purple Lumen Status N/A  Catheter fill solution Heparin 1000 units/ml  Catheter fill volume (Arterial) 1.4 cc  Catheter fill volume (Venous) 1.4  Dressing Type Biopatch  Dressing Status Clean;Dry;Intact  Drainage Description None  Post treatment catheter status Capped and Clamped

## 2018-05-18 LAB — HEPATITIS C ANTIBODY: HCV Ab: <0.1 s/co ratio (ref 0.0–0.9)

## 2018-05-18 LAB — HEPATITIS B SURFACE ANTIBODY,QUALITATIVE: Hep B S Ab: NONREACTIVE

## 2018-05-18 LAB — GLUCOSE, CAPILLARY
Glucose-Capillary: 130 mg/dL — ABNORMAL HIGH (ref 70–99)
Glucose-Capillary: 136 mg/dL — ABNORMAL HIGH (ref 70–99)
Glucose-Capillary: 149 mg/dL — ABNORMAL HIGH (ref 70–99)
Glucose-Capillary: 93 mg/dL (ref 70–99)
Glucose-Capillary: 99 mg/dL (ref 70–99)

## 2018-05-18 LAB — HEPATITIS B SURFACE ANTIGEN: Hepatitis B Surface Ag: NEGATIVE

## 2018-05-18 LAB — HEPARIN INDUCED PLATELET AB (HIT ANTIBODY): Heparin Induced Plt Ab: 0.472 OD — ABNORMAL HIGH (ref 0.000–0.400)

## 2018-05-18 LAB — CULTURE, BLOOD (ROUTINE X 2)
Culture: NO GROWTH
Culture: NO GROWTH
Special Requests: ADEQUATE
Special Requests: ADEQUATE

## 2018-05-18 LAB — HEPATITIS B CORE ANTIBODY, TOTAL: Hep B Core Total Ab: NEGATIVE

## 2018-05-18 MED ORDER — HYDRALAZINE HCL 20 MG/ML IJ SOLN
10.0000 mg | Freq: Four times a day (QID) | INTRAMUSCULAR | Status: DC | PRN
  Administered 2018-05-18 – 2018-05-23 (×7): 10 mg via INTRAVENOUS
  Filled 2018-05-18 (×8): qty 1

## 2018-05-18 MED ORDER — INSULIN ASPART 100 UNIT/ML ~~LOC~~ SOLN
0.0000 [IU] | Freq: Three times a day (TID) | SUBCUTANEOUS | Status: DC
  Administered 2018-05-18 – 2018-05-20 (×3): 1 [IU] via SUBCUTANEOUS
  Administered 2018-05-22 (×2): 2 [IU] via SUBCUTANEOUS
  Administered 2018-05-22 – 2018-05-23 (×2): 1 [IU] via SUBCUTANEOUS
  Administered 2018-05-23: 3 [IU] via SUBCUTANEOUS
  Administered 2018-05-24: 1 [IU] via SUBCUTANEOUS
  Administered 2018-05-24: 2 [IU] via SUBCUTANEOUS
  Administered 2018-05-25: 1 [IU] via SUBCUTANEOUS
  Administered 2018-05-25: 2 [IU] via SUBCUTANEOUS
  Filled 2018-05-18 (×12): qty 1

## 2018-05-18 NOTE — Progress Notes (Signed)
Pharmacy Antibiotic Note  Tracy Jennings is a 62 y.o. female admitted on 05/11/2018. Patient s/p cardiac arrest.  Pharmacy consulted 9/1 for cefepime dosing with concern for aspiration PNA. Patient is also receiving Flagyl. Patient is on day 6 of therapy.  9/6 Flagyl 500 mg IV q8h discontinued.   Plan: Continue cefepime 1 g IV q24h.  Consider stopping IV abx since patient has had 6 days of treatment  Height: 5' 3.5" (161.3 cm) Weight: 182 lb 8.7 oz (82.8 kg) IBW/kg (Calculated) : 53.55  Temp (24hrs), Avg:99.3 F (37.4 C), Min:98.8 F (37.1 C), Max:99.5 F (37.5 C)  Recent Labs  Lab 05/11/18 1922  05/12/18 0225  05/12/18 1310  05/12/18 1605  05/13/18 0450  05/13/18 1518 05/13/18 1849  05/14/18 0323  05/14/18 1912 05/14/18 2304 05/15/18 0300 05/16/18 0502 05/17/18 0530  WBC 12.3*  --  13.1*  --   --   --   --   --  5.7  --   --   --   --  7.2  --   --   --   --   --  11.1*  CREATININE 5.73*   < > 5.76*   < >  --    < >  --    < > 5.58*   < > 5.69* 5.63*   < > 5.97*   < > 4.54* 5.71* 4.37* 5.78* 6.50*  LATICACIDVEN  --   --   --   --  1.1  --  1.2  --   --   --  0.9 0.6  --   --   --   --   --   --   --   --    < > = values in this interval not displayed.    Estimated Creatinine Clearance: 9.5 mL/min (A) (by C-G formula based on SCr of 6.5 mg/dL (H)).    Allergies  Allergen Reactions  . Hctz [Hydrochlorothiazide]     pancreatitis  . Amlodipine Swelling    Knees down to ankles  . Amlodipine Besylate Swelling  . Sulfa Antibiotics Rash  . D[employer] Aid [Lactase]     Runny nose    Antimicrobials this admission: Cefepime 9/1 >> Flagyl 9/1 >>9/6  Microbiology results: 9/1 BCx: NGTD 8/30 MRSA PCR: negative  Thank you for allowing pharmacy to be a part of this patient's care.  Pernell Dupre, PharmD, BCPS Clinical Pharmacist 05/18/2018 11:02 AM

## 2018-05-18 NOTE — Progress Notes (Signed)
Occupational Therapy Treatment Patient Details Name: AEON KOORS MRN: [id number] DOB: 23-Jul-1956 Today's Date: 05/18/2018    History of present illness Pt admitted for complication for dialysis access. Pt initially admitted on 8/30 for placement for AV fistula and then had complication for thrombosis post op, went back for thrombectomy and had code blue requiring CPR requiring intubation. Pt extubated on 9/3. Per chart review, looks that pt's fistula is now occluded. HIstory includes CKD, DM, ESRD, HTN, PVD, and pancreatitis.    OT comments  Pt. education was provided about BUE functioning, ROM, positioning, and engaging the UEs during basic ADL tasks. Pt. continues to present with limited activity tolerance, weakness, impaired coordination, limited UE functioning, and decreased functional mobility which continue to hinder her ability to complete basic ADL, and IADL functioning. Pt. continues to benefit from OT services for ADL training, A/E training, and pt. education about energy conservation, work simplification, home modification, and DME. Pt. would benefit from CIR level of care, with follow-up OT services.   Follow Up Recommendations  CIR    Equipment Recommendations  Other (comment)    Recommendations for Other Services      Precautions / Restrictions Precautions Precautions: Fall Restrictions Weight Bearing Restrictions: No Other Position/Activity Restrictions: Temp dialysis cath R LE       Mobility Bed Mobility    Pt. seen at bed level                                                                               ADL either performed or assessed with clinical judgement   ADL Overall ADL's : Needs assistance/impaired     Grooming: Bed level;Minimal assistance;Moderate assistance   Upper Body Bathing: Bed level;Moderate assistance   Lower Body Bathing: Bed level;Maximal assistance   Upper Body Dressing : Bed level;Moderate  assistance   Lower Body Dressing: Bed level;Maximal assistance                       Vision Baseline Vision/History: Wears glasses Wears Glasses: Reading only Vision Assessment?: Yes Eye Alignment: Within Functional Limits Alignment/Gaze Preference: Within Defined Limits Tracking/Visual Pursuits: Impaired - to be further tested in functional context;Requires cues, head turns, or add eye shifts to track Visual Fields: No apparent deficits   Perception     Praxis      Cognition Arousal/Alertness: Awake/alert Behavior During Therapy: Flat affect Overall Cognitive Status: Impaired/Different from baseline Area of Impairment: Orientation;Memory;Following commands                 Orientation Level: Disoriented to;Time;Situation   Memory: Decreased short-term memory Following Commands: Follows one step commands with increased time                Exercises     Shoulder Instructions       General Comments      Pertinent Vitals/ Pain       Pain Assessment: No/denies pain Pain Score: 0-No pain  Home Living     Available Help at Discharge: Family Type of Home: House  Prior Functioning/Environment              Frequency  Min 3X/week        Progress Toward Goals  OT Goals(current goals can now be found in the care plan section)     Acute Rehab OT Goals Patient Stated Goal: pt unable to verbalize - spouse wants pt to return to PLOF/independence OT Goal Formulation: With patient/family Potential to Achieve Goals: Good  Plan      Co-evaluation            SLP goals addressed during session: Cognition;Swallowing    AM-PAC PT "6 Clicks" Daily Activity     Outcome Measure   Help from another person eating meals?: Total Help from another person taking care of personal grooming?: A Lot Help from another person toileting, which includes using toliet, bedpan, or urinal?: A Lot Help from  another person bathing (including washing, rinsing, drying)?: A Lot Help from another person to put on and taking off regular upper body clothing?: A Lot Help from another person to put on and taking off regular lower body clothing?: A Lot 6 Click Score: 11    End of Session    OT Visit Diagnosis: Other abnormalities of gait and mobility (R26.89);Muscle weakness (generalized) (M62.81);Other symptoms and signs involving cognitive function   Activity Tolerance Patient limited by lethargy   Patient Left in bed;with call bell/phone within reach;with bed alarm set;with family/visitor present   Nurse Communication          Time: 4765-4650 OT Time Calculation (min): 21 min  Charges: OT General Charges $OT Visit: 1 Visit OT Treatments $Self Care/Home Management : 8-22 mins  Harrel Carina, MS, OTR/L   Harrel Carina 05/18/2018, 11:44 AM

## 2018-05-18 NOTE — Progress Notes (Signed)
[facility] [place] NAME: Taisa Deloria    MR#:  [id number]  DATE OF BIRTH:  03-15-1956  SUBJECTIVE:   Patient confused  REVIEW OF SYSTEMS:  Unable to obtain patient is confused   Tolerating Diet: yes      DRUG ALLERGIES:   Allergies  Allergen Reactions  . Hctz [Hydrochlorothiazide]     pancreatitis  . Amlodipine Swelling    Knees down to ankles  . Amlodipine Besylate Swelling  . Sulfa Antibiotics Rash  . Dairy Aid [Lactase]     Runny nose    VITALS:  Blood pressure (!) 179/59, pulse 94, temperature 98.8 F (37.1 C), resp. rate 16, height 5' 3.5" (1.613 m), weight 82.8 kg, SpO2 95 %.  PHYSICAL EXAMINATION:  Constitutional: Appears well-developed and well-nourished. No distress. HENT: Normocephalic. Marland Kitchen Oropharynx is clear and moist.  Eyes: Conjunctivae and EOM are normal. PERRLA, no scleral icterus.  Neck: Normal ROM. Neck supple. No JVD. No tracheal deviation. CVS: RRR, S1/S2 +, no murmurs, no gallops, no carotid bruit.  Pulmonary: Effort and breath sounds normal, no stridor, rhonchi, wheezes, rales.  Abdominal: Soft. BS +,  no distension, tenderness, rebound or guarding.  Musculoskeletal: Normal range of motion. No edema and no tenderness.  Neuro: Alert. CN 2-12 grossly intact. No focal deficits.  Oriented to name and place not time she displays confusion Skin: Skin is warm and dry. No rash noted. Psychiatric: Confused     LABORATORY PANEL:   CBC Recent Labs  Lab 05/17/18 0530  WBC 11.1*  HGB 8.3*  HCT 25.7*  PLT 248   ------------------------------------------------------------------------------------------------------------------  Chemistries  Recent Labs  Lab 05/13/18 0450  05/16/18 0502 05/17/18 0530  NA 143   < > 150* 148*  K 3.4*   < > 4.0 4.5  CL 112*   < > 118* 118*  CO2 21*   < > 21* 18*  GLUCOSE 106*   < > 123* 153*  BUN 62*   < > 62* 74*  CREATININE 5.58*   < > 5.78* 6.50*  CALCIUM  7.5*   < > 8.5* 8.2*  MG 1.8  --  2.1  --   AST 19  --   --   --   ALT 11  --   --   --   ALKPHOS 37*  --   --   --   BILITOT 0.5  --   --   --    < > = values in this interval not displayed.   ------------------------------------------------------------------------------------------------------------------  Cardiac Enzymes Recent Labs  Lab 05/12/18 0225 05/12/18 0818 05/12/18 1413  TROPONINI 0.08* 0.07* 0.06*   ------------------------------------------------------------------------------------------------------------------  RADIOLOGY:  No results found.   ASSESSMENT AND PLAN:   62 year old female of peripheral vascular disease, diabetes and end-stage renal disease who presented initially on August 30 for an elective fistula placement however suffered cardiac arrest.  1.  Acute encephalopathy likely due to anoxic brain injury from cardiorespiratory arrest versus hyponatremia versus uremia Follow-up in MRI Neurology following in consultation is appreciated  2.  Chronic kidney disease which has progressed to end-stage renal disease: Due to diabetic nephropathy/hypertension nephrology is initiating dialysis PermCath next week 3.  Pneumonia: Continue cefepime and Flagyl for 2 more days  4.  Essential hypertension: Continue diltiazem PRN hydralazine  5.  Diabetes: Start sliding scale 6.  Peripheral vascular disease: Continue statin, aspirin and Plavix  7.  Hypernatremia: Obtain labs for this morning.  8.  Status post cardiac arrest: Patient was on hypothermic protocol  9.  Anemia chronic kidney disease with stable hemoglobin  PT evaluation is recommended CIR D/w nursing Husband making decisions  CODE STATUS: FULL  TOTAL TIME TAKING CARE OF THIS PATIENT: 21 minutes.     POSSIBLE D/C ??, DEPENDING ON CLINICAL CONDITION.   Kennadi Albany M.D on 05/18/2018 at 10:58 AM  Between 7am to 6pm - Pager - [phone] After 6pm go to [email or web address] - password [email or web address]  [facility]  Office  [phone]  CC: Primary care physician; No primary care provider on file.  Note: This dictation was prepared with Dragon dictation along with smaller phrase technology. Any transcriptional errors that result from this process are unintentional.

## 2018-05-18 NOTE — Plan of Care (Signed)
Patient will demonstrate safety awareness in current environment with use of call button to request NSG staff for basic wants/needs without reported falls in next 2-3 sessions.

## 2018-05-18 NOTE — Progress Notes (Signed)
Patient more alert and oriented today. When asked why she refused to have the MRI done, she stated that she is claustrophobic. I then asked if she would be willing to do it if we gave her something to help relax her. She said no because she is scared. Her husband, Tracy Jennings, respects Tracy Jennings wishes and does not want to put her through the stress of having an MRI.

## 2018-05-18 NOTE — Progress Notes (Signed)
This note also relates to the following rows which could not be included: Pulse Rate - Cannot attach notes to unvalidated device data BP - Cannot attach notes to unvalidated device data  Hd started

## 2018-05-18 NOTE — Progress Notes (Signed)
Requested prn Hydralazine for elevated blood pressures from pharmacy.

## 2018-05-18 NOTE — Progress Notes (Signed)
   05/17/18 1600  PPD Results  Does patient have an induration at the injection site? No

## 2018-05-18 NOTE — Progress Notes (Addendum)
PT Cancellation Note  Patient Details Name: Tracy Jennings MRN: [id number] DOB: December 01, 1955   Cancelled Treatment:    Reason Eval/Treat Not Completed: Patient at procedure or test/unavailable.  Pt currently off unit at dialysis.  Will re-attempt PT treatment session at a later date/time.  Leitha Bleak, PT 05/18/18, 2:28 PM [phone]  Addendum:  Pt currently with L temporary femoral dialysis catheter (pt restricted to bed rest per protocol); d/t pt limited to bed rest level activities with L temporary fem dialysis cath restrictions, will change pt's frequency to min 2x/week.  Once temporary fem dialysis cath is removed, will re-assess pt's mobility and update frequency as appropriate.  Leitha Bleak, PT 05/18/18, 4:10 PM [phone]

## 2018-05-18 NOTE — Progress Notes (Signed)
  Speech Language Pathology Treatment: Dysphagia  Patient Details Name: Tracy Jennings MRN: [id number] DOB: [birth year] Today's Date: 05/18/2018 Time: 2671-2458 SLP Time Calculation (min) (ACUTE ONLY): 14 min  Assessment / Plan / Recommendation Clinical Impression  Patient observed sitting upright in bed following postural modifications for upright positioning for PO intake. Noted vocal volume has improved since yesterday. Patient independently stated that she was thirsty and wanted water. SLP provided set-up assistance for oral care prior to PO intake. D/t potential ROM deficits with arm/hands, SLP provided feeding assistance and set-up assistance for PO trials. SLP provided x5 spoonfuls of thin liquids, which patient consumed without coughing/choking episodes. Given verbal cues to take small sips by cup, pt consumed x7-10 sips of thin liquids via cup without anterior oral spillage and occasional belching, however no coughing noted. SLP assisted with consumption of softened cracker in applesauce, which patient consumed in x4 trials with mild residue and pocketing in bilateral sulci in oral cavity. She also consumed x3 regular cracker not softened in applesauce without coughing/choking, however oral residue noted in bilateral sulci. Given cues to perform lingual sweep and alternate with sips of thin liquids, pt able to clear residue in oral cavity. Pt requested additional sips of thin liquids via cup, however took multiple sips in a row without ceasing, requiring verbal cues from SLP to cease intake. At that time, following 1-2 minutes, pt did demonstrate belching following with x1 delayed coughing episode; however potentially d/t large multiple sips in a row despite cues from SLP. D/t potential impulsivity and residue in oral cavity with soft cracker consistency with continued cognitive deficits, SLP to recommend mech soft and thin liquid diet with no straws and supervision from staff/family to assist with  feeding and monitor impulsive rate of consumption. SLP provided recommendations to [place] - she verbalized understanding. Will continue to monitor and upgrade as appropriate.    HPI HPI: Patient is a 62 y.o. female with PMHx of Anemia (04/2018), CKD (chronic kidney disease), Diabetes mellitus without complication ([place]), ESRD (end stage renal disease) ([place]), Heart murmur, Hyperlipidemia associated with type 2 diabetes mellitus ([place]), Hypertension, Pancreatitis, and Peripheral vascular disease ([place]). Post operative complication of cardiac arrest with physician report stating "Atelectasis and pneumonia with possible aspiration s/p cardiac arrest.  Improved bibasilar airspace disease.  MRSA PCR negative". Altered mental status however "No acute infarct on CT head 05/16/2018". Patient extubated on 05/15/18.      SLP Plan  Continue with current plan of care  Patient needs continued Speech Lanaguage Pathology Services    Recommendations  Diet recommendations: Dysphagia 3 (mechanical soft);Thin liquid Liquids provided via: Cup;No straw Medication Administration: Crushed with puree Supervision: Staff to assist with self feeding;Full supervision/cueing for compensatory strategies Compensations: Minimize environmental distractions;Slow rate;Small sips/bites;Lingual sweep for clearance of pocketing;Follow solids with liquid Postural Changes and/or Swallow Maneuvers: Seated upright 90 degrees                Oral Care Recommendations: Oral care before and after PO;Staff/trained caregiver to provide oral care SLP Visit Diagnosis: Dysphagia, oropharyngeal phase (R13.12) Plan: Continue with current plan of care       Tracy Jennings, M.S. CCC-SLP Speech-Language Pathologist               Tracy Jennings 05/18/2018, 11:06 AM

## 2018-05-18 NOTE — Evaluation (Signed)
Speech Language Pathology Evaluation Patient Details Name: FELICIA BLOOMQUIST MRN: [id number] DOB: 25-Aug-1956 Today's Date: 05/18/2018 Time: 8182-9937 SLP Time Calculation (min) (ACUTE ONLY): 44 min  Problem List:  Patient Active Problem List   Diagnosis Date Noted  . Complication of vascular access for dialysis 05/11/2018  . Respiratory arrest ([place]) 05/11/2018  . Hyperlipidemia 04/12/2018  . Pancreatitis, recurrent 11/01/2017  . Diabetes ([place]) 11/01/2017  . HTN (hypertension) 11/01/2017  . CKD (chronic kidney disease), stage IV ([place]) 11/01/2017  . Recurrent pancreatitis 11/01/2017   Past Medical History:  Past Medical History:  Diagnosis Date  . Anemia 04/2018   low iron. to be started on supplements  . CKD (chronic kidney disease)    Stage IV  . Diabetes mellitus without complication ([place])    type II  . ESRD (end stage renal disease) ([place])   . Heart murmur    followed as a child only  . Hyperlipidemia associated with type 2 diabetes mellitus ([place])   . Hypertension   . Pancreatitis   . Peripheral vascular disease [facility])    Past Surgical History:  Past Surgical History:  Procedure Laterality Date  . AMPUTATION TOE Left 2013   2nd toe. tip of toe (toe nail was infected)  . AV FISTULA PLACEMENT Left 05/11/2018   Procedure: ARTERIOVENOUS (AV) FISTULA CREATION;  Surgeon: Katha Cabal, MD;  Location: [facility] ORS;  Service: Vascular;  Laterality: Left;  . CATARACT EXTRACTION    . CHOLECYSTECTOMY  2014  . COLONOSCOPY    . COLONOSCOPY WITH PROPOFOL N/A 01/10/2018   Procedure: COLONOSCOPY WITH PROPOFOL;  Surgeon: Toledo, Benay Pike, MD;  Location: [facility] ENDOSCOPY;  Service: Gastroenterology;  Laterality: N/A;  . EYE SURGERY Bilateral 2018   cataract extractions  . THROMBECTOMY W/ EMBOLECTOMY  05/11/2018   Procedure: THROMBECTOMY ARTERIOVENOUS FISTULA;  Surgeon: Katha Cabal, MD;  Location: [facility] ORS;  Service: Vascular;;  . TUBAL LIGATION  1984   HPI:  Patient is a 62 y.o.  female with PMHx of Anemia (04/2018), CKD (chronic kidney disease), Diabetes mellitus without complication ([place]), ESRD (end stage renal disease) ([place]), Heart murmur, Hyperlipidemia associated with type 2 diabetes mellitus ([place]), Hypertension, Pancreatitis, and Peripheral vascular disease ([place]). Post operative complication of cardiac arrest with physician report stating "Atelectasis and pneumonia with possible aspiration s/p cardiac arrest.  Improved bibasilar airspace disease.  MRSA PCR negative". Altered mental status however "No acute infarct on CT head 05/16/2018". Patient extubated on 05/15/18.   Assessment / Plan / Recommendation Clinical Impression  Patient scored a 10/30 on the [facility] indicating moderate to severe cognitive communication deficits. HOWEVER, difficulty with writing and visual abilites may have impacted patients score on the visualspatial section of the [place]. Patient demonstrates increased alertness since yesterday with decreased perseveration during conversationl speech, following 2 step directions with at least 75% accuracy, and x3 observations of requesting items verbally and independently (i.e. can i have a drink of water and can i use the restroom). NSG agreed that patient does demonsrate increased awareness since yesterday and requesting basic wants/need with increased frequency and at appropriate times. D/t clinical observations from SLP and [facility] staff, [place] score may not reflect patients true cognitve abilites at this time. However, pt continues to demonstrate disorienation to date/time and decrease recall of recent events. SLP provided education re: use of call button for safety and alerting NSG staff for basic wants/needs in order to increase problem solving skills and reduce risk of falls. Given min verbal/visual cues, pt able to  demonsrate use of call button to request NSG at appropriate time during the session. Will continue to monitor.    SLP Assessment  SLP  Recommendation/Assessment: Patient needs continued Speech Lanaguage Pathology Services SLP Visit Diagnosis: Cognitive communication deficit (R41.841)    Follow Up Recommendations       Frequency and Duration min 3x week  2 weeks      SLP Evaluation Cognition  Overall Cognitive Status: Impaired/Different from baseline Arousal/Alertness: Awake/alert Orientation Level: Oriented to person;Oriented to place Memory: Impaired Memory Impairment: Decreased recall of new information;Decreased short term memory Awareness: Impaired Behaviors: (Perseveration not observed during today's session.)       Comprehension  Auditory Comprehension Overall Auditory Comprehension: Impaired Yes/No Questions: Within Functional Limits Commands: Impaired Two Step Basic Commands: 50-74% accurate Conversation: Simple Other Conversation Comments: Patient utliized y/n responses most often during conversation.  Interfering Components: Attention EffectiveTechniques: Extra processing time;Pausing;Repetition Visual Recognition/Discrimination Discrimination: Exceptions to WFL(Deficits on visualspatial section of clock drawing test, however potentially d/t visual impairment and/or difficulty with writing.)    Expression Expression Primary Mode of Expression: Verbal Verbal Expression Overall Verbal Expression: Appears within functional limits for tasks assessed Initiation: No impairment Automatic Speech: Name;Month of year;Social Response Level of Generative/Spontaneous Verbalization: Phrase;Word;Sentence Repetition: No impairment Naming: No impairment Interfering Components: Attention Written Expression Dominant Hand: Right Written Expression: (Writing potentially impaired d/t ROM deficits in right and left arm/hand.)   Oral / Motor      [name], M.S. CCC-SLP Speech-Language Pathologist                   [name] 05/18/2018, 10:56 AM

## 2018-05-18 NOTE — Progress Notes (Signed)
Patient continues to whisper with occasional stronger voice; continues to be slow in response to questions; able to ask for water at times; moving around bed and turning self; tolerating medications in puree; low grade fever overnight, resolved; Barbaraann Faster, RN 6:24 AM 05/18/2018

## 2018-05-18 NOTE — Progress Notes (Signed)
[facility]  ROUNDING NOTE   Subjective:   Patient is more alert today Able to answer few questions appropriately Oriented to place and person  Objective:  Vital signs in last 24 hours:  Temp:  [98.7 F (37.1 C)-99.5 F (37.5 C)] 98.7 F (37.1 C) (09/06 1400) Pulse Rate:  [90-95] 90 (09/06 1415) Resp:  [16-20] 16 (09/06 1415) BP: (166-188)/(59-81) 188/81 (09/06 1415) SpO2:  [95 %-99 %] 99 % (09/06 1201) FiO2 (%):  [21 %] 21 % (09/06 0733) Weight:  [82.8 kg] 82.8 kg (09/06 0355)  Weight change: 4.2 kg Filed Weights   05/17/18 1115 05/17/18 1335 05/18/18 0355  Weight: 86.9 kg 86.9 kg 82.8 kg    Intake/Output: I/O last 3 completed shifts: In: 3038.9 [P.O.:2575; I.V.:10; IV Piggyback:453.9] Out: 907 [Urine:900; Other:7]   Intake/Output this shift:  Total I/O In: 120 [P.O.:120] Out: -   Physical Exam: General: Critically ill  Head: Dry oral mucus membranes  Eyes: Anicteric   Neck: Supple,    Lungs:  Coarse at bases,    Heart: Regular rate and rhythm,  Abdomen:  Soft, nontender,   Extremities: + dependent peripheral edema.  Neurologic:  able to follow commands and answer questions today  Skin: No lesions  Access: Left AVF - no thrill or bruit appreciated. Left groin temp cath  Foley in place  Basic Metabolic Panel: Recent Labs  Lab 05/11/18 1922  05/12/18 0225  05/13/18 0450  05/14/18 1912 05/14/18 2304 05/15/18 0300 05/15/18 1601 05/16/18 0502 05/17/18 0530 05/17/18 1144  NA 139   < > 141   < > 143   < > 145 144 146*  --  150* 148*  --   K 5.3*   < > 4.1   < > 3.4*   < > 3.2* 4.1 3.2* 4.5 4.0 4.5  --   CL 111   < > 109   < > 112*   < > 124* 116* 125*  --  118* 118*  --   CO2 21*   < > 22   < > 21*   < > 16* 21* 18*  --  21* 18*  --   GLUCOSE 192*   < > 235*   < > 106*   < > 154* 194* 151*  --  123* 153*  --   BUN 59*   < > 61*   < > 62*   < > 47* 59* 48*  --  62* 74*  --   CREATININE 5.73*   < > 5.76*   < > 5.58*   < > 4.54* 5.71* 4.37*   --  5.78* 6.50*  --   CALCIUM 8.0*   < > 7.9*   < > 7.5*   < > 5.9* 7.8* 6.1*  --  8.5* 8.2*  --   MG 1.7  --  1.9  --  1.8  --   --   --   --   --  2.1  --   --   PHOS 5.9*  --   --   --  4.2  --   --   --   --   --   --   --  7.3*   < > = values in this interval not displayed.    Liver Function Tests: Recent Labs  Lab 05/11/18 1922 05/13/18 0450 05/14/18 2000  AST 39 19  --   ALT 32 11  --   ALKPHOS 42 37*  --  BILITOT 0.5 0.5  --   PROT 5.8* 4.7*  --   ALBUMIN 2.8* 2.4* 1.7*   No results for input(s): LIPASE, AMYLASE in the last 168 hours. No results for input(s): AMMONIA in the last 168 hours.  CBC: Recent Labs  Lab 05/11/18 1922 05/12/18 0225 05/13/18 0450 05/14/18 0323 05/17/18 0530  WBC 12.3* 13.1* 5.7 7.2 11.1*  NEUTROABS 11.2*  --  3.7 5.2 9.4*  HGB 9.0* 8.7* 7.8* 8.6* 8.3*  HCT 27.5* 26.4* 23.1* 25.1* 25.7*  MCV 90.5 89.7 87.8 90.1 90.5  PLT 207 143* 174 170 248    Cardiac Enzymes: Recent Labs  Lab 05/11/18 1922 05/11/18 2105 05/12/18 0225 05/12/18 0818 05/12/18 1413  TROPONINI 0.03* 0.05* 0.08* 0.07* 0.06*    BNP: Invalid input(s): POCBNP  CBG: Recent Labs  Lab 05/17/18 2018 05/17/18 2356 05/18/18 0354 05/18/18 0730 05/18/18 1201  GLUCAP 140* 154* 130* 136* 149*    Microbiology: Results for orders placed or performed during the hospital encounter of 05/11/18  MRSA PCR Screening     Status: None   Collection Time: 05/11/18  7:56 PM  Result Value Ref Range Status   MRSA by PCR NEGATIVE NEGATIVE Final    Comment:        The GeneXpert MRSA Assay (FDA approved for NASAL specimens only), is one component of a comprehensive MRSA colonization surveillance program. It is not intended to diagnose MRSA infection nor to guide or monitor treatment for MRSA infections. Performed at [facility], [address] [place], [place] [address]   CULTURE, BLOOD (ROUTINE X 2) w Reflex to ID Panel     Status: None   Collection Time:  05/13/18  3:18 PM  Result Value Ref Range Status   Specimen Description BLOOD BFOA  Final   Special Requests   Final    BOTTLES DRAWN AEROBIC AND ANAEROBIC Blood Culture adequate volume   Culture   Final    NO GROWTH 5 DAYS Performed at [facility], [address] [place], [place] [address]    Report Status 05/18/2018 FINAL  Final  CULTURE, BLOOD (ROUTINE X 2) w Reflex to ID Panel     Status: None   Collection Time: 05/13/18  4:33 PM  Result Value Ref Range Status   Specimen Description BLOOD L HAND  Final   Special Requests   Final    BOTTLES DRAWN AEROBIC AND ANAEROBIC Blood Culture adequate volume   Culture   Final    NO GROWTH 5 DAYS Performed at [facility], [address] [place], [place] [address]    Report Status 05/18/2018 FINAL  Final    Coagulation Studies: No results for input(s): LABPROT, INR in the last 72 hours.  Urinalysis: No results for input(s): COLORURINE, LABSPEC, PHURINE, GLUCOSEU, HGBUR, BILIRUBINUR, KETONESUR, PROTEINUR, UROBILINOGEN, NITRITE, LEUKOCYTESUR in the last 72 hours.  Invalid input(s): APPERANCEUR    Imaging: No results found.   Medications:   . ceFEPime (MAXIPIME) IV 1 g (05/18/18 1228)   . acetylcysteine  3 mL Nebulization BID  . aspirin  81 mg Per Tube Daily  . atorvastatin  40 mg Oral q1800  . Chlorhexidine Gluconate Cloth  6 each Topical Q0600  . clopidogrel  75 mg Oral Daily  . diltiazem  120 mg Oral Daily  . docusate  100 mg Per Tube BID  . famotidine  20 mg Per Tube QHS  . insulin aspart  0-9 Units Subcutaneous TID WC  . ipratropium-albuterol  3 mL  Nebulization QID  . sodium chloride flush  10-40 mL Intracatheter Q12H   acetaminophen **OR** acetaminophen, hydrALAZINE, sodium chloride flush, sorbitol  Assessment/ Plan:  Tracy Jennings is a 62 y.o. black female with dialysis mellitus type II insulin dependent, hypertension, hyperlipidemia, peripheral vascular disease, chronic pancreatitis,  anemia of chronic kidney disease admitted on 05/11/2018 for AVF creation by Dr. Delana Meyer. Post operative complication of cardiac arrest  1. 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 With history of nephrotic range proteinuria.  Baseline creatinine of 4.68, GFR of 11.  Chronic kidney disease secondary to diabetic nephropathy.   Patient tolerated hemodialysis treatment well yesterday Slightly longer treatment today Expect another treatment tomorrow - permcath next week  2.  Cardiac arrest: placed on hypothermic protocol.  - Appreciate cardiology input  3. Acute resp failure with volume overload - extubated now. -Doing better  4. Anemia of chronic kidney disease: normocytic. Hemoglobin dropped to 8.3 Hold ESA due to ischemia  5. Diabetes mellitus type II with chronic kidney disease: insulin dependent. History of poor control. Hemoglobin A1c 6.6% on 02/26/18.   6. Hypernatremia Expected to improve once she is able to have normal oral fluid intake.   LOS: 7 Myan Locatelli 9/6/20192:27 PM

## 2018-05-19 LAB — CBC
HCT: 25.5 % — ABNORMAL LOW (ref 35.0–47.0)
Hemoglobin: 8.8 g/dL — ABNORMAL LOW (ref 12.0–16.0)
MCH: 30.5 pg (ref 26.0–34.0)
MCHC: 34.4 g/dL (ref 32.0–36.0)
MCV: 88.6 fL (ref 80.0–100.0)
Platelets: 186 10*3/uL (ref 150–440)
RBC: 2.88 MIL/uL — ABNORMAL LOW (ref 3.80–5.20)
RDW: 13.7 % (ref 11.5–14.5)
WBC: 9.9 10*3/uL (ref 3.6–11.0)

## 2018-05-19 LAB — GLUCOSE, CAPILLARY
Glucose-Capillary: 97 mg/dL (ref 70–99)
Glucose-Capillary: 125 mg/dL — ABNORMAL HIGH (ref 70–99)
Glucose-Capillary: 87 mg/dL (ref 70–99)
Glucose-Capillary: 89 mg/dL (ref 70–99)

## 2018-05-19 LAB — BASIC METABOLIC PANEL
Anion gap: 10 (ref 5–15)
BUN: 32 mg/dL — ABNORMAL HIGH (ref 6–20)
CO2: 28 mmol/L (ref 22–32)
Calcium: 7.5 mg/dL — ABNORMAL LOW (ref 8.9–10.3)
Chloride: 105 mmol/L (ref 98–111)
Creatinine, Ser: 3.55 mg/dL — ABNORMAL HIGH (ref 0.44–1.00)
GFR calc Af Amer: 15 mL/min — ABNORMAL LOW (ref >60)
GFR calc non Af Amer: 13 mL/min — ABNORMAL LOW (ref >60)
Glucose, Bld: 98 mg/dL (ref 70–99)
Potassium: 3.5 mmol/L (ref 3.5–5.1)
Sodium: 143 mmol/L (ref 135–145)

## 2018-05-19 LAB — HEPARIN INDUCED PLATELET AB (HIT ANTIBODY): Heparin Induced Plt Ab: 0.514 OD — ABNORMAL HIGH (ref 0.000–0.400)

## 2018-05-19 MED ORDER — ANTICOAGULANT SODIUM CITRATE 4% (200MG/5ML) IV SOLN
5.0000 mL | Status: DC
  Administered 2018-05-21: 5 mL via INTRAVENOUS
  Filled 2018-05-19 (×6): qty 5

## 2018-05-19 MED ORDER — IPRATROPIUM-ALBUTEROL 0.5-2.5 (3) MG/3ML IN SOLN
3.0000 mL | Freq: Two times a day (BID) | RESPIRATORY_TRACT | Status: DC
  Filled 2018-05-19: qty 3

## 2018-05-19 NOTE — Plan of Care (Signed)
Resting quietly in bed,no complaints voiced,callbell within reach

## 2018-05-19 NOTE — Progress Notes (Signed)
[facility] [place] NAME: Tracy Jennings    MR#:  [id number]  DATE OF BIRTH:  1956-04-01  SUBJECTIVE:  CHIEF COMPLAINT:  No chief complaint on file.  -Complains of weakness and tiredness.  Alert and oriented today.  Husband at bedside.  REVIEW OF SYSTEMS:  Review of Systems  Constitutional: Positive for malaise/fatigue. Negative for chills and fever.  HENT: Negative for congestion, hearing loss, nosebleeds and sinus pain.   Eyes: Negative for blurred vision.  Respiratory: Negative for cough, shortness of breath and wheezing.   Cardiovascular: Negative for chest pain and palpitations.  Gastrointestinal: Negative for abdominal pain, constipation, diarrhea, nausea and vomiting.  Genitourinary: Negative for dysuria.  Musculoskeletal: Negative for myalgias.  Neurological: Negative for dizziness, focal weakness, seizures, weakness and headaches.  Psychiatric/Behavioral: Negative for depression.    DRUG ALLERGIES:   Allergies  Allergen Reactions  . Hctz [Hydrochlorothiazide]     pancreatitis  . Amlodipine Swelling    Knees down to ankles  . Amlodipine Besylate Swelling  . Sulfa Antibiotics Rash  . Dairy Aid [Lactase]     Runny nose    VITALS:  Blood pressure (!) 162/75, pulse 91, temperature 99.4 F (37.4 C), temperature source Oral, resp. rate 16, height 5' 3.5" (1.613 m), weight 81.6 kg, SpO2 100 %.  PHYSICAL EXAMINATION:  Physical Exam   GENERAL:  62 y.o.-year-old patient lying in the bed with no acute distress.  EYES: Pupils equal, round, reactive to light and accommodation. No scleral icterus. Extraocular muscles intact.  HEENT: Head atraumatic, normocephalic. Oropharynx and nasopharynx clear.  NECK:  Supple, no jugular venous distention. No thyroid enlargement, no tenderness.  LUNGS: Normal breath sounds bilaterally, no wheezing, rales,rhonchi or crepitation. No use of accessory muscles of respiration.  Decreased  bibasilar breath sounds. CARDIOVASCULAR: S1, S2 normal. No murmurs, rubs, or gallops.  ABDOMEN: Soft, nontender, nondistended. Bowel sounds present. No organomegaly or mass.  EXTREMITIES: No pedal edema, cyanosis, or clubbing. Left groin temporary dialysis catheter seen NEUROLOGIC: Cranial nerves II through XII are intact. Muscle strength 5/5 in all extremities. Sensation intact. Gait not checked. Global weakness noted. PSYCHIATRIC: The patient is alert and oriented x 3.  SKIN: No obvious rash, lesion, or ulcer.    LABORATORY PANEL:   CBC Recent Labs  Lab 05/19/18 0748  WBC 9.9  HGB 8.8*  HCT 25.5*  PLT 186   ------------------------------------------------------------------------------------------------------------------  Chemistries  Recent Labs  Lab 05/13/18 0450  05/16/18 0502  05/19/18 0748  NA 143   < > 150*   < > 143  K 3.4*   < > 4.0   < > 3.5  CL 112*   < > 118*   < > 105  CO2 21*   < > 21*   < > 28  GLUCOSE 106*   < > 123*   < > 98  BUN 62*   < > 62*   < > 32*  CREATININE 5.58*   < > 5.78*   < > 3.55*  CALCIUM 7.5*   < > 8.5*   < > 7.5*  MG 1.8  --  2.1  --   --   AST 19  --   --   --   --   ALT 11  --   --   --   --   ALKPHOS 37*  --   --   --   --   BILITOT 0.5  --   --   --   --    < > =  values in this interval not displayed.   ------------------------------------------------------------------------------------------------------------------  Cardiac Enzymes Recent Labs  Lab 05/12/18 1413  TROPONINI 0.06*   ------------------------------------------------------------------------------------------------------------------  RADIOLOGY:  No results found.  EKG:   Orders placed or performed during the hospital encounter of 05/11/18  . EKG 12-Lead  . EKG 12-Lead  . EKG 12-Lead  . EKG 12-Lead  . EKG 12-Lead  . 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  1.  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 -Improving.  More alert and oriented at this time. -Appreciate neurology consultation.  2.  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  Started on hemodialysis in the hospital. -Has a left groin temporary dialysis catheter.  Permacath scheduled for next week.  3.  Hypertension-on Cardizem  4.  Anemia of chronic disease-EPO with dialysis recommended  5.  Pneumonia while in the ICU-on cefepime.  6.  Peripheral vascular disease-on Plavix and statin  7.  DVT prophylaxis-teds and SCDs.  Heparin antibodies positive on blood work.  Physical therapy consult after the left groin catheter is removed Updated husband at bedside.   All the records are reviewed and case discussed with Care Management/Social Workerr. Management plans discussed with the patient, family and they are in agreement.  CODE STATUS: Full code  TOTAL TIME TAKING CARE OF THIS PATIENT: 38 minutes.   POSSIBLE D/C IN 3-4 DAYS, DEPENDING ON CLINICAL CONDITION.   Gladstone Lighter M.D on 05/19/2018 at 12:43 PM  Between 7am to 6pm - Pager - [phone]  After 6pm go to [email or web address] - password [email or web address] [facility]  Office  [phone]  CC: Primary care physician; No primary care provider on file.

## 2018-05-19 NOTE — Progress Notes (Signed)
Post HD Treatment  HD treatment completed. Patient tolerated treatment well. Net UF was 55 and BVP was 55.5. Report called to primary nurse who was informed of patient's elevated BP. No complaints noted by patient at this time.     05/19/18 1749  Vital Signs  Temp 99.3 F (37.4 C)  Temp Source Oral  Pulse Rate Source Monitor  Resp 20  BP (!) 186/76  BP Location Right Arm  BP Method Automatic  Patient Position (if appropriate) Lying  Oxygen Therapy  O2 Device Room Air  Pain Assessment  Pain Scale 0-10  Pain Score 0  Dialysis Weight  Weight 82.8 kg  Type of Weight Post-Dialysis  During Hemodialysis Assessment  Intra-Hemodialysis Comments Tolerated well;Tx completed  Post-Hemodialysis Assessment  Rinseback Volume (mL) 250 mL  KECN 55.5 V  Dialyzer Clearance Lightly streaked  Duration of HD Treatment -hour(s) 3 hour(s)  Hemodialysis Intake (mL) 500 mL  UF Total -Machine (mL) 555 mL  Net UF (mL) 55 mL  Tolerated HD Treatment Yes  Hemodialysis Catheter Left Femoral vein Triple-lumen  Placement Date/Time: 05/17/18 0300   Time Out: Correct patient;Correct procedure;Correct site  Maximum sterile barrier precautions: Hand hygiene;Sterile gown;Cap;Sterile gloves;Mask;Large sterile sheet  Site Prep: Chlorhexidine;Skin Prep Completely Dr...  Site Condition No complications  Blue Lumen Status Heparin locked  Red Lumen Status Heparin locked  Purple Lumen Status Saline locked  Catheter fill solution Heparin 1000 units/ml  Catheter fill volume (Arterial) 1.4 cc  Catheter fill volume (Venous) 1.4  Dressing Type Biopatch;Occlusive  Dressing Status Clean;Dry;Intact  Post treatment catheter status Capped and Clamped

## 2018-05-19 NOTE — Progress Notes (Signed)
HD Treatment Initiated    05/19/18 1403  Vital Signs  Pulse Rate 87  Pulse Rate Source Monitor  Resp 18  Oxygen Therapy  SpO2 99 %  O2 Device Room Air  During Hemodialysis Assessment  Blood Flow Rate (mL/min) 300 mL/min  Arterial Pressure (mmHg) -80 mmHg  Venous Pressure (mmHg) 100 mmHg  Transmembrane Pressure (mmHg) 50 mmHg  Ultrafiltration Rate (mL/min) 160 mL/min  Dialysate Flow Rate (mL/min) 600 ml/min  Conductivity: Machine  13.8  HD Safety Checks Performed Yes  Dialysis Fluid Bolus Normal Saline  Bolus Amount (mL) 250 mL  Intra-Hemodialysis Comments Tx initiated  Hemodialysis Catheter Left Femoral vein Triple-lumen  Placement Date/Time: 05/17/18 0300   Time Out: Correct patient;Correct procedure;Correct site  Maximum sterile barrier precautions: Hand hygiene;Sterile gown;Cap;Sterile gloves;Mask;Large sterile sheet  Site Prep: Chlorhexidine;Skin Prep Completely Dr...  Blue Lumen Status Infusing;Flushed;Blood return noted;Other (Comment)  Red Lumen Status Infusing;Flushed;Blood return noted;Other (Comment)  Purple Lumen Status Saline locked  Dressing Type Biopatch;Occlusive  Dressing Status Clean;Dry;Intact  Drainage Description None

## 2018-05-19 NOTE — Progress Notes (Signed)
Post HD Assessment    05/19/18 1755  Neurological  Level of Consciousness Alert  Orientation Level Oriented X4  Respiratory  Respiratory Pattern Regular;Unlabored;Symmetrical  Chest Assessment Chest expansion symmetrical  Bilateral Breath Sounds Clear  Cardiac  Pulse Regular  Heart Sounds S1, S2  ECG Monitor Yes  Cardiac Rhythm NSR  Vascular  R Radial Pulse +2  L Radial Pulse +2  R Dorsalis Pedis Pulse +2  L Dorsalis Pedis Pulse +2  Integumentary  Integumentary (WDL) X  Skin Color Appropriate for ethnicity  Skin Condition Dry  Musculoskeletal  Musculoskeletal (WDL) X  Generalized Weakness Yes  Gastrointestinal  Bowel Sounds Assessment Active  GU Assessment  Genitourinary (WDL) X (HD pt)  Psychosocial  Psychosocial (WDL) WDL

## 2018-05-19 NOTE — Progress Notes (Signed)
[facility]  ROUNDING NOTE   Subjective:   Patient is more alert today Able to answer few questions appropriately Oriented to place and person Tolerated hemodialysis well yesterday Cannot undergo physical therapy due to femoral dialysis catheter  Objective:  Vital signs in last 24 hours:  Temp:  [98.7 F (37.1 C)-100 F (37.8 C)] 99.4 F (37.4 C) (09/07 0436) Pulse Rate:  [90-97] 91 (09/07 0943) Resp:  [15-28] 16 (09/07 0436) BP: (101-192)/(62-90) 162/75 (09/07 0943) SpO2:  [93 %-100 %] 100 % (09/07 0436) Weight:  [81.6 kg] 81.6 kg (09/07 0500)  Weight change: -5.298 kg Filed Weights   05/17/18 1335 05/18/18 0355 05/19/18 0500  Weight: 86.9 kg 82.8 kg 81.6 kg    Intake/Output: I/O last 3 completed shifts: In: 3158.9 [P.O.:2695; I.V.:10; IV Piggyback:453.9] Out: 850 [Urine:850]   Intake/Output this shift:  Total I/O In: 100.1 [IV Piggyback:100.1] Out: -   Physical Exam: General:  No acute distress, laying in the bed  Head:  Moist oral mucus membranes  Eyes: Anicteric   Neck: Supple,    Lungs:  Coarse at bases, normal breathing effort  Heart: Regular rate and rhythm,  Abdomen:  Soft, nontender,   Extremities: + dependent peripheral edema.  Neurologic:  able to follow commands and answer questions today  Skin: No lesions  Access: Left AVF - no thrill or bruit appreciated. Left groin temp cath    Basic Metabolic Panel: Recent Labs  Lab 05/13/18 0450  05/14/18 2304 05/15/18 0300 05/15/18 1601 05/16/18 0502 05/17/18 0530 05/17/18 1144 05/19/18 0748  NA 143   < > 144 146*  --  150* 148*  --  143  K 3.4*   < > 4.1 3.2* 4.5 4.0 4.5  --  3.5  CL 112*   < > 116* 125*  --  118* 118*  --  105  CO2 21*   < > 21* 18*  --  21* 18*  --  28  GLUCOSE 106*   < > 194* 151*  --  123* 153*  --  98  BUN 62*   < > 59* 48*  --  62* 74*  --  32*  CREATININE 5.58*   < > 5.71* 4.37*  --  5.78* 6.50*  --  3.55*  CALCIUM 7.5*   < > 7.8* 6.1*  --  8.5* 8.2*  --   7.5*  MG 1.8  --   --   --   --  2.1  --   --   --   PHOS 4.2  --   --   --   --   --   --  7.3*  --    < > = values in this interval not displayed.    Liver Function Tests: Recent Labs  Lab 05/13/18 0450 05/14/18 2000  AST 19  --   ALT 11  --   ALKPHOS 37*  --   BILITOT 0.5  --   PROT 4.7*  --   ALBUMIN 2.4* 1.7*   No results for input(s): LIPASE, AMYLASE in the last 168 hours. No results for input(s): AMMONIA in the last 168 hours.  CBC: Recent Labs  Lab 05/13/18 0450 05/14/18 0323 05/17/18 0530 05/19/18 0748  WBC 5.7 7.2 11.1* 9.9  NEUTROABS 3.7 5.2 9.4*  --   HGB 7.8* 8.6* 8.3* 8.8*  HCT 23.1* 25.1* 25.7* 25.5*  MCV 87.8 90.1 90.5 88.6  PLT 174 170 248 186    Cardiac Enzymes: Recent  Labs  Lab 05/12/18 1413  TROPONINI 0.06*    BNP: Invalid input(s): POCBNP  CBG: Recent Labs  Lab 05/18/18 1201 05/18/18 1711 05/18/18 2122 05/19/18 0729 05/19/18 1159  GLUCAP 149* 93 99 60 125*    Microbiology: Results for orders placed or performed during the hospital encounter of 05/11/18  MRSA PCR Screening     Status: None   Collection Time: 05/11/18  7:56 PM  Result Value Ref Range Status   MRSA by PCR NEGATIVE NEGATIVE Final    Comment:        The GeneXpert MRSA Assay (FDA approved for NASAL specimens only), is one component of a comprehensive MRSA colonization surveillance program. It is not intended to diagnose MRSA infection nor to guide or monitor treatment for MRSA infections. Performed at [facility], [address] [place], [place] [address]   CULTURE, BLOOD (ROUTINE X 2) w Reflex to ID Panel     Status: None   Collection Time: 05/13/18  3:18 PM  Result Value Ref Range Status   Specimen Description BLOOD BFOA  Final   Special Requests   Final    BOTTLES DRAWN AEROBIC AND ANAEROBIC Blood Culture adequate volume   Culture   Final    NO GROWTH 5 DAYS Performed at [facility], [address] [place], [place] [address]     Report Status 05/18/2018 FINAL  Final  CULTURE, BLOOD (ROUTINE X 2) w Reflex to ID Panel     Status: None   Collection Time: 05/13/18  4:33 PM  Result Value Ref Range Status   Specimen Description BLOOD L HAND  Final   Special Requests   Final    BOTTLES DRAWN AEROBIC AND ANAEROBIC Blood Culture adequate volume   Culture   Final    NO GROWTH 5 DAYS Performed at [facility], [address], [place], [place] [address]    Report Status 05/18/2018 FINAL  Final    Coagulation Studies: No results for input(s): LABPROT, INR in the last 72 hours.  Urinalysis: No results for input(s): COLORURINE, LABSPEC, PHURINE, GLUCOSEU, HGBUR, BILIRUBINUR, KETONESUR, PROTEINUR, UROBILINOGEN, NITRITE, LEUKOCYTESUR in the last 72 hours.  Invalid input(s): APPERANCEUR    Imaging: No results found.   Medications:   . ceFEPime (MAXIPIME) IV 1 g (05/19/18 0951)   . aspirin  81 mg Per Tube Daily  . atorvastatin  40 mg Oral q1800  . Chlorhexidine Gluconate Cloth  6 each Topical Q0600  . clopidogrel  75 mg Oral Daily  . diltiazem  120 mg Oral Daily  . docusate  100 mg Per Tube BID  . famotidine  20 mg Per Tube QHS  . insulin aspart  0-9 Units Subcutaneous TID WC  . ipratropium-albuterol  3 mL Nebulization BID  . sodium chloride flush  10-40 mL Intracatheter Q12H   acetaminophen **OR** acetaminophen, hydrALAZINE, sodium chloride flush, sorbitol  Assessment/ Plan:  [name] is a 62 y.o. black female with dialysis mellitus type II insulin dependent, hypertension, hyperlipidemia, peripheral vascular disease, chronic pancreatitis, anemia of chronic kidney disease admitted on 05/11/2018 for AVF creation by Dr. Delana Meyer. Post operative complication of cardiac arrest  1. 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 With history of nephrotic range proteinuria.  Baseline creatinine of 4.68, GFR of 11.  Chronic kidney  disease secondary to diabetic nephropathy.   Patient tolerated hemodialysis treatment well yesterday Slightly longer treatment today - permcath next week -  outpatient d/c planing; PPD placed 9/3 Results for ASHLYND, MICHNA (MRN [id number])   Ref. Range 05/17/2018 11:44  Hepatitis B Surface Ag Latest Ref Range: Negative  Negative  Hep B S Ab Unknown Non Reactive  Hep B Core Ab, Tot Latest Ref Range: Negative  Negative  HCV Ab Latest Ref Range: 0.0 - 0.9 s/co ratio <0.1    2.  Cardiac arrest: placed on hypothermic protocol.  - Appreciate cardiology input  3. Acute resp failure with volume overload - extubated now. -Doing better  4. Anemia of chronic kidney disease: normocytic. Hemoglobin dropped to 8.8 Hold ESA due to ischemia  5. Diabetes mellitus type II with chronic kidney disease: insulin dependent. History of poor control. Hemoglobin A1c 6.6% on 02/26/18.   6. Hypernatremia Improved   LOS: 8 Esten Dollar 9/7/201912:42 PM

## 2018-05-19 NOTE — Progress Notes (Signed)
RT to bedside for 2000 scheduled breathing treatment. Patient stated she did not want her treatment and would wait until the next scheduled treatment at 0800. Patient resting comfortably in bed on room air with family member at bedside.

## 2018-05-19 NOTE — Progress Notes (Signed)
Pre HD Assessment    05/19/18 1345  Neurological  Level of Consciousness Alert  Orientation Level Oriented X4  Respiratory  Respiratory Pattern Regular;Unlabored;Symmetrical  Chest Assessment Chest expansion symmetrical  Bilateral Breath Sounds Clear  Cardiac  Pulse Regular  Heart Sounds S1, S2  ECG Monitor Yes  Cardiac Rhythm NSR  Vascular  R Radial Pulse +2  L Radial Pulse +2  R Dorsalis Pedis Pulse +2  L Dorsalis Pedis Pulse +2  Integumentary  Integumentary (WDL) X  Skin Color Appropriate for ethnicity  Skin Condition Dry  Musculoskeletal  Musculoskeletal (WDL) X  Generalized Weakness Yes  Gastrointestinal  Bowel Sounds Assessment Active  GU Assessment  Genitourinary (WDL) X (HD pt)  Psychosocial  Psychosocial (WDL) WDL

## 2018-05-19 NOTE — Progress Notes (Signed)
Pre HD Treatment    05/19/18 1345  Vital Signs  Temp 99.3 F (37.4 C)  Temp Source Oral  Pulse Rate 90  Pulse Rate Source Monitor  Resp 18  BP (!) 191/78  BP Location Right Arm  BP Method Automatic  Patient Position (if appropriate) Lying  Oxygen Therapy  SpO2 100 %  O2 Device Room Air  Pain Assessment  Pain Scale 0-10  Pain Score 0  Dialysis Weight  Weight 82.9 kg  Type of Weight Pre-Dialysis  Time-Out for Hemodialysis  What Procedure? HD  Pt Identifiers(min of two) First/Last Name;MRN/Account#;Pt's DOB(use if MRN/Acct# not available  Correct Site? Yes  Correct Side? Yes  Correct Procedure? Yes  Consents Verified? Yes  Rad Studies Available? N/A  Safety Precautions Reviewed? Yes  Engineer, civil (consulting) Number [phone]  Station Number 4  UF/Alarm Test Passed  Conductivity: Meter 14  Conductivity: Machine  14  pH 7.6  Reverse Osmosis Main  Normal Saline Lot Number [id number]  Dialyzer Lot Number [id number]  Disposable Set Lot Number [id number]  Machine Temperature 98.6 F (37 C)  Musician and Audible Yes  Blood Lines Intact and Secured Yes  Pre Treatment Patient Checks  Vascular access used during treatment Catheter  Hepatitis B Surface Antigen Results Negative  Date Hepatitis B Surface Antigen Drawn 05/17/18  Hepatitis B Surface Antibody  (Negative)  Date Hepatitis B Surface Antibody Drawn 05/17/18  Hemodialysis Consent Verified Yes  Hemodialysis Standing Orders Initiated Yes  ECG (Telemetry) Monitor On Yes  Prime Ordered Normal Saline  Length of  DialysisTreatment -hour(s) 3 Hour(s)  Dialyzer Elisio 17H NR  Dialysate 2K, 2.5 Ca  Dialysis Anticoagulant None  Dialysate Flow Ordered 600  Blood Flow Rate Ordered 300 mL/min  Ultrafiltration Goal 0 Liters  Pre Treatment Labs Other (Comment)  Dialysis Blood Pressure Support Ordered Normal Saline  Education / Care Plan  Dialysis Education Provided Yes  Documented Education in Care Plan Yes  Hemodialysis  Catheter Left Femoral vein Triple-lumen  Placement Date/Time: 05/17/18 0300   Time Out: Correct patient;Correct procedure;Correct site  Maximum sterile barrier precautions: Hand hygiene;Sterile gown;Cap;Sterile gloves;Mask;Large sterile sheet  Site Prep: Chlorhexidine;Skin Prep Completely Dr...  Site Condition No complications  Blue Lumen Status Capped (Central line)  Red Lumen Status Capped (Central line)  Purple Lumen Status Saline locked  Dressing Status Clean;Dry;Intact

## 2018-05-19 NOTE — Progress Notes (Signed)
OT Cancellation Note  Patient Details Name: Tracy Jennings MRN: [id number] DOB: 10/26/1955   Cancelled Treatment:    Reason Eval/Treat Not Completed: Patient at procedure or test/ unavailable. Pt unavailable for OT tx, receiving dialysis. Will re-attempt at later date/time as pt is available and medically appropriate.   [name], MPH, MS, OTR/L ascom [phone] 05/19/18, 2:36 PM

## 2018-05-20 ENCOUNTER — Inpatient Hospital Stay: Payer: Managed Care, Other (non HMO)

## 2018-05-20 LAB — BASIC METABOLIC PANEL
Anion gap: 11 (ref 5–15)
BUN: 20 mg/dL (ref 6–20)
CO2: 29 mmol/L (ref 22–32)
Calcium: 7.7 mg/dL — ABNORMAL LOW (ref 8.9–10.3)
Chloride: 103 mmol/L (ref 98–111)
Creatinine, Ser: 2.94 mg/dL — ABNORMAL HIGH (ref 0.44–1.00)
GFR calc Af Amer: 19 mL/min — ABNORMAL LOW (ref >60)
GFR calc non Af Amer: 16 mL/min — ABNORMAL LOW (ref >60)
Glucose, Bld: 90 mg/dL (ref 70–99)
Potassium: 3.3 mmol/L — ABNORMAL LOW (ref 3.5–5.1)
Sodium: 143 mmol/L (ref 135–145)

## 2018-05-20 LAB — GLUCOSE, CAPILLARY
Glucose-Capillary: 84 mg/dL (ref 70–99)
Glucose-Capillary: 111 mg/dL — ABNORMAL HIGH (ref 70–99)
Glucose-Capillary: 143 mg/dL — ABNORMAL HIGH (ref 70–99)
Glucose-Capillary: 123 mg/dL — ABNORMAL HIGH (ref 70–99)

## 2018-05-20 MED ORDER — CEFAZOLIN SODIUM-DEXTROSE 2-4 GM/100ML-% IV SOLN
2.0000 g | INTRAVENOUS | Status: AC
  Administered 2018-05-21: 1 g via INTRAVENOUS
  Filled 2018-05-20: qty 100

## 2018-05-20 MED ORDER — NEPRO/CARBSTEADY PO LIQD
237.0000 mL | Freq: Two times a day (BID) | ORAL | Status: DC
  Administered 2018-05-20 – 2018-05-24 (×5): 237 mL via ORAL

## 2018-05-20 MED ORDER — IPRATROPIUM-ALBUTEROL 0.5-2.5 (3) MG/3ML IN SOLN: 3.0000 mL | Freq: Four times a day (QID) | RESPIRATORY_TRACT | Status: DC | PRN

## 2018-05-20 MED ORDER — TORSEMIDE 20 MG PO TABS
40.0000 mg | ORAL_TABLET | Freq: Every day | ORAL | Status: DC
  Administered 2018-05-20 – 2018-05-24 (×5): 40 mg via ORAL
  Administered 2018-05-25: 20 mg via ORAL
  Filled 2018-05-20 (×8): qty 2

## 2018-05-20 NOTE — Progress Notes (Signed)
[facility]  ROUNDING NOTE   Subjective:   Patient is more alert today Tracy feels back to normal. Cannot remember events from 2 days ago Able to answer few questions appropriately Oriented to place Tracy person Tolerated hemodialysis well yesterday Cannot undergo physical therapy due to femoral dialysis catheter Appetite still poor. Added nepro  Objective:  Vital signs in last 24 hours:  Temp:  [98.8 F (37.1 C)-99.5 F (37.5 C)] 99.1 F (37.3 C) (09/08 0540) Pulse Rate:  [87-97] 92 (09/08 0540) Resp:  [11-26] 18 (09/08 0540) BP: (176-207)/(69-148) 183/69 (09/08 0540) SpO2:  [96 %-100 %] 97 % (09/08 0540) Weight:  [82.8 kg-82.9 kg] 82.8 kg (09/07 1749)  Weight change: 1.298 kg Filed Weights   05/19/18 0500 05/19/18 1345 05/19/18 1749  Weight: 81.6 kg 82.9 kg 82.8 kg    Intake/Output: I/O last 3 completed shifts: In: 100.1 [IV Piggyback:100.1] Out: 355 [Urine:300; Other:55]   Intake/Output this shift:  No intake/output data recorded.  Physical Exam: General:  No acute distress, laying in the bed  Head:  Moist oral mucus membranes  Eyes: Anicteric   Neck: Supple,    Lungs:  normal breathing effort, clear  Heart: Regular rate Tracy rhythm,  Abdomen:  Soft, nontender,   Extremities: + dependent peripheral edema.  Neurologic:  Alert Tracy oreinted  Skin: No lesions  Access: Left AVF - no thrill or bruit appreciated. Left femoral temp cath    Basic Metabolic Panel: Recent Labs  Lab 05/15/18 0300 05/15/18 1601 05/16/18 0502 05/17/18 0530 05/17/18 1144 05/19/18 0748 05/20/18 0729  NA 146*  --  150* 148*  --  143 143  K 3.2* 4.5 4.0 4.5  --  3.5 3.3*  CL 125*  --  118* 118*  --  105 103  CO2 18*  --  21* 18*  --  28 29  GLUCOSE 151*  --  123* 153*  --  98 90  BUN 48*  --  62* 74*  --  32* 20  CREATININE 4.37*  --  5.78* 6.50*  --  3.55* 2.94*  CALCIUM 6.1*  --  8.5* 8.2*  --  7.5* 7.7*  MG  --   --  2.1  --   --   --   --   PHOS  --   --   --   --   7.3*  --   --     Liver Function Tests: Recent Labs  Lab 05/14/18 2000  ALBUMIN 1.7*   No results for input(s): LIPASE, AMYLASE in the last 168 hours. No results for input(s): AMMONIA in the last 168 hours.  CBC: Recent Labs  Lab 05/14/18 0323 05/17/18 0530 05/19/18 0748  WBC 7.2 11.1* 9.9  NEUTROABS 5.2 9.4*  --   HGB 8.6* 8.3* 8.8*  HCT 25.1* 25.7* 25.5*  MCV 90.1 90.5 88.6  PLT 170 248 186    Cardiac Enzymes: No results for input(s): CKTOTAL, CKMB, CKMBINDEX, TROPONINI in the last 168 hours.  BNP: Invalid input(s): POCBNP  CBG: Recent Labs  Lab 05/19/18 0729 05/19/18 1159 05/19/18 1814 05/19/18 2109 05/20/18 0731  GLUCAP 97 125* 87 89 84    Microbiology: Results for orders placed or performed during the hospital encounter of 05/11/18  MRSA PCR Screening     Status: None   Collection Time: 05/11/18  7:56 PM  Result Value Ref Range Status   MRSA by PCR NEGATIVE NEGATIVE Final    Comment:  The GeneXpert MRSA Assay (FDA approved for NASAL specimens only), is one component of a comprehensive MRSA colonization surveillance program. It is not intended to diagnose MRSA infection nor to guide or monitor treatment for MRSA infections. Performed at [facility], [address] [place], [place] [address]   CULTURE, BLOOD (ROUTINE X 2) w Reflex to ID Panel     Status: None   Collection Time: 05/13/18  3:18 PM  Result Value Ref Range Status   Specimen Description BLOOD BFOA  Final   Special Requests   Final    BOTTLES DRAWN AEROBIC Tracy ANAEROBIC Blood Culture adequate volume   Culture   Final    NO GROWTH 5 DAYS Performed at [facility], [address] [place], [place] [address]    Report Status 05/18/2018 FINAL  Final  CULTURE, BLOOD (ROUTINE X 2) w Reflex to ID Panel     Status: None   Collection Time: 05/13/18  4:33 PM  Result Value Ref Range Status   Specimen Description BLOOD L HAND  Final   Special Requests   Final     BOTTLES DRAWN AEROBIC Tracy ANAEROBIC Blood Culture adequate volume   Culture   Final    NO GROWTH 5 DAYS Performed at [facility], [address] [place], [place] [address]    Report Status 05/18/2018 FINAL  Final    Coagulation Studies: No results for input(s): LABPROT, INR in the last 72 hours.  Urinalysis: No results for input(s): COLORURINE, LABSPEC, PHURINE, GLUCOSEU, HGBUR, BILIRUBINUR, KETONESUR, PROTEINUR, UROBILINOGEN, NITRITE, LEUKOCYTESUR in the last 72 hours.  Invalid input(s): APPERANCEUR    Imaging: No results found.   Medications:   . [START ON 05/21/2018] anticoagulant sodium citrate    . ceFEPime (MAXIPIME) IV 1 g (05/20/18 1023)   . aspirin  81 mg Per Tube Daily  . atorvastatin  40 mg Oral q1800  . Chlorhexidine Gluconate Cloth  6 each Topical Q0600  . clopidogrel  75 mg Oral Daily  . diltiazem  120 mg Oral Daily  . docusate  100 mg Per Tube BID  . famotidine  20 mg Per Tube QHS  . feeding supplement (NEPRO CARB STEADY)  237 mL Oral BID BM  . insulin aspart  0-9 Units Subcutaneous TID WC  . sodium chloride flush  10-40 mL Intracatheter Q12H  . torsemide  40 mg Oral Daily   acetaminophen **OR** acetaminophen, hydrALAZINE, ipratropium-albuterol, sodium chloride flush, sorbitol  Assessment/ Plan:  [name] is a 62 y.o. black female with dialysis mellitus type II insulin dependent, hypertension, hyperlipidemia, peripheral vascular disease, chronic pancreatitis, anemia of chronic kidney disease admitted on 05/11/2018 for AVF creation by Dr. Delana Jennings. Post operative complication of cardiac arrest  1. 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 With history of nephrotic range proteinuria.  Baseline creatinine of 4.68, GFR of 11.  Chronic kidney disease secondary to diabetic nephropathy. Discussed various modalities of RRT with patient including HD Tracy PD. Patient chose HD at  present. Transplant referral as outpatient  Patient tolerated hemodialysis treatment well yesterday Next HD treatment on monday - permcath next week after antibiotics have been stopped. CXR results pending - outpatient d/c planing; PPD placed 9/3 Results for Tracy, Jennings (MRN [id number])   Ref. Range 05/17/2018 11:44  Hepatitis B Surface Ag Latest Ref Range: Negative  Negative  Hep B S Ab Unknown Non Reactive  Hep B Core Ab, Tot  Latest Ref Range: Negative  Negative  HCV Ab Latest Ref Range: 0.0 - 0.9 s/co ratio <0.1    2.  Cardiac arrest: placed on hypothermic protocol.  - Appreciate cardiology input  3. Acute resp failure with volume overload - extubated now. -Doing better  4. Anemia of chronic kidney disease: normocytic. Hemoglobin dropped to 8.8 Hold ESA due to ischemia  5. Diabetes mellitus type II with chronic kidney disease: insulin dependent. History of poor control. Hemoglobin A1c 6.6% on 02/26/18.   6. Hypernatremia Improved   LOS: 9 Fredrich Cory 9/8/201911:19 AM

## 2018-05-20 NOTE — Progress Notes (Signed)
[facility] [place] NAME: Tracy Jennings    MR#:  [id number]  DATE OF BIRTH:  1956/05/15  SUBJECTIVE:  CHIEF COMPLAINT:  No chief complaint on file.  -mental status much improved.  Doing well today.  Plan for permacath early next week.  REVIEW OF SYSTEMS:  Review of Systems  Constitutional: Negative for chills, fever and malaise/fatigue.  HENT: Negative for congestion, hearing loss, nosebleeds and sinus pain.   Eyes: Negative for blurred vision.  Respiratory: Negative for cough, shortness of breath and wheezing.   Cardiovascular: Negative for chest pain and palpitations.  Gastrointestinal: Negative for abdominal pain, constipation, diarrhea, nausea and vomiting.  Genitourinary: Negative for dysuria.  Musculoskeletal: Negative for myalgias.  Neurological: Negative for dizziness, focal weakness, seizures, weakness and headaches.  Psychiatric/Behavioral: Negative for depression.    DRUG ALLERGIES:   Allergies  Allergen Reactions  . Hctz [Hydrochlorothiazide]     pancreatitis  . Amlodipine Swelling    Knees down to ankles  . Amlodipine Besylate Swelling  . Sulfa Antibiotics Rash  . Dairy Aid [Lactase]     Runny nose    VITALS:  Blood pressure (!) 183/69, pulse 92, temperature 99.1 F (37.3 C), temperature source Oral, resp. rate 18, height 5' 3.5" (1.613 m), weight 82.8 kg, SpO2 97 %.  PHYSICAL EXAMINATION:  Physical Exam   GENERAL:  62 y.o.-year-old patient lying in the bed with no acute distress.  EYES: Pupils equal, round, reactive to light and accommodation. No scleral icterus. Extraocular muscles intact.  HEENT: Head atraumatic, normocephalic. Oropharynx and nasopharynx clear.  NECK:  Supple, no jugular venous distention. No thyroid enlargement, no tenderness.  LUNGS: Normal breath sounds bilaterally, no wheezing, rales,rhonchi or crepitation. No use of accessory muscles of respiration.  Decreased bibasilar breath  sounds. CARDIOVASCULAR: S1, S2 normal. No murmurs, rubs, or gallops.  ABDOMEN: Soft, nontender, nondistended. Bowel sounds present. No organomegaly or mass.  EXTREMITIES: No pedal edema, cyanosis, or clubbing. Left groin temporary dialysis catheter seen NEUROLOGIC: Cranial nerves II through XII are intact. Muscle strength 5/5 in all extremities. Sensation intact. Gait not checked. Global weakness noted. PSYCHIATRIC: The patient is alert and oriented x 3.  SKIN: No obvious rash, lesion, or ulcer.    LABORATORY PANEL:   CBC Recent Labs  Lab 05/19/18 0748  WBC 9.9  HGB 8.8*  HCT 25.5*  PLT 186   ------------------------------------------------------------------------------------------------------------------  Chemistries  Recent Labs  Lab 05/16/18 0502  05/20/18 0729  NA 150*   < > 143  K 4.0   < > 3.3*  CL 118*   < > 103  CO2 21*   < > 29  GLUCOSE 123*   < > 90  BUN 62*   < > 20  CREATININE 5.78*   < > 2.94*  CALCIUM 8.5*   < > 7.7*  MG 2.1  --   --    < > = values in this interval not displayed.   ------------------------------------------------------------------------------------------------------------------  Cardiac Enzymes No results for input(s): TROPONINI in the last 168 hours. ------------------------------------------------------------------------------------------------------------------  RADIOLOGY:  No results found.  EKG:   Orders placed or performed during the hospital encounter of 05/11/18  . EKG 12-Lead  . EKG 12-Lead  . EKG 12-Lead  . EKG 12-Lead  . EKG 12-Lead  . EKG 12-Lead    ASSESSMENT AND PLAN:   62 year old female with past medical history significant for peripheral vascular disease, diabetes, CKD stage V progressing to end-stage renal disease  presented after cardiac arrest.  1.  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 -Improved.  More alert and oriented x 3  now -Appreciate neurology consultation.  2.  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  Started on hemodialysis this admission -Has a left groin temporary dialysis catheter.  Permacath scheduled for next week. - need to work on setting up outpatient dialysis  3.  Hypertension-on Cardizem  4.  Anemia of chronic disease-EPO with dialysis recommended  5.  Pneumonia while in the ICU-on cefepime. Off ABX now  6.  Peripheral vascular disease-on Plavix and statin  7.  DVT prophylaxis-teds and SCDs.  Heparin antibodies positive on blood work. So avoid heparin flushes as well  Physical therapy consult after the left groin catheter is removed Updated husband at bedside.   All the records are reviewed and case discussed with Care Management/Social Workerr. Management plans discussed with the patient, family and they are in agreement.  CODE STATUS: Full code  TOTAL TIME TAKING CARE OF THIS PATIENT: 34 minutes.   POSSIBLE D/C IN 3-4 DAYS, DEPENDING ON CLINICAL CONDITION.   Gladstone Lighter M.D on 05/20/2018 at 8:50 AM  Between 7am to 6pm - Pager - [phone]  After 6pm go to [email or web address] - password [email or web address] [facility]  Office  [phone]  CC: Primary care physician; No primary care provider on file.

## 2018-05-20 NOTE — Progress Notes (Signed)
Patient wants vascular surgeon to speak with her about perm-cath placement prior to signing consent form, she stated " I don't remember what they said, yea let me talk to them before I sign that". Consent form is prepared in the chart just needs patient signature and witness signature.

## 2018-05-21 ENCOUNTER — Inpatient Hospital Stay (HOSPITAL_COMMUNITY): Payer: Managed Care, Other (non HMO) | Admitting: Occupational Therapy

## 2018-05-21 ENCOUNTER — Inpatient Hospital Stay
Admission: AD | Disposition: A | Discharge status: Home Health | Payer: Self-pay | Source: Ambulatory Visit | Attending: Family Medicine

## 2018-05-21 ENCOUNTER — Encounter: Payer: Self-pay | Admitting: Emergency Medicine

## 2018-05-21 ENCOUNTER — Ambulatory Visit (INDEPENDENT_AMBULATORY_CARE_PROVIDER_SITE_OTHER): Payer: Managed Care, Other (non HMO) | Admitting: Vascular Surgery

## 2018-05-21 DIAGNOSIS — N186 End stage renal disease: Secondary | ICD-10-CM

## 2018-05-21 HISTORY — PX: DIALYSIS/PERMA CATHETER INSERTION: CATH118288

## 2018-05-21 LAB — BASIC METABOLIC PANEL
Anion gap: 9 (ref 5–15)
BUN: 29 mg/dL — ABNORMAL HIGH (ref 6–20)
CO2: 32 mmol/L (ref 22–32)
Calcium: 8 mg/dL — ABNORMAL LOW (ref 8.9–10.3)
Chloride: 102 mmol/L (ref 98–111)
Creatinine, Ser: 3.81 mg/dL — ABNORMAL HIGH (ref 0.44–1.00)
GFR calc Af Amer: 14 mL/min — ABNORMAL LOW (ref >60)
GFR calc non Af Amer: 12 mL/min — ABNORMAL LOW (ref >60)
Glucose, Bld: 113 mg/dL — ABNORMAL HIGH (ref 70–99)
Potassium: 3 mmol/L — ABNORMAL LOW (ref 3.5–5.1)
Sodium: 143 mmol/L (ref 135–145)

## 2018-05-21 LAB — GLUCOSE, CAPILLARY
Glucose-Capillary: 112 mg/dL — ABNORMAL HIGH (ref 70–99)
Glucose-Capillary: 84 mg/dL (ref 70–99)
Glucose-Capillary: 131 mg/dL — ABNORMAL HIGH (ref 70–99)

## 2018-05-21 LAB — CBC
HCT: 24.7 % — ABNORMAL LOW (ref 35.0–47.0)
Hemoglobin: 8.5 g/dL — ABNORMAL LOW (ref 12.0–16.0)
MCH: 30.7 pg (ref 26.0–34.0)
MCHC: 34.6 g/dL (ref 32.0–36.0)
MCV: 88.8 fL (ref 80.0–100.0)
Platelets: 175 10*3/uL (ref 150–440)
RBC: 2.78 MIL/uL — ABNORMAL LOW (ref 3.80–5.20)
RDW: 13.3 % (ref 11.5–14.5)
WBC: 9.2 10*3/uL (ref 3.6–11.0)

## 2018-05-21 SURGERY — DIALYSIS/PERMA CATHETER INSERTION
Anesthesia: Moderate Sedation

## 2018-05-21 MED ORDER — MIDAZOLAM HCL 2 MG/2ML IJ SOLN
INTRAMUSCULAR | Status: DC | PRN
  Administered 2018-05-21 (×2): 1 mg via INTRAVENOUS

## 2018-05-21 MED ORDER — HEPARIN (PORCINE) IN NACL 1000-0.9 UT/500ML-% IV SOLN
INTRAVENOUS | Status: AC
  Filled 2018-05-21: qty 500

## 2018-05-21 MED ORDER — MIDAZOLAM HCL 5 MG/5ML IJ SOLN
INTRAMUSCULAR | Status: AC
  Filled 2018-05-21: qty 5

## 2018-05-21 MED ORDER — OXYCODONE-ACETAMINOPHEN 5-325 MG PO TABS
1.0000 | ORAL_TABLET | Freq: Four times a day (QID) | ORAL | Status: DC | PRN
  Administered 2018-05-21 – 2018-05-24 (×5): 1 via ORAL
  Filled 2018-05-21 (×5): qty 1

## 2018-05-21 MED ORDER — FENTANYL CITRATE (PF) 100 MCG/2ML IJ SOLN
INTRAMUSCULAR | Status: DC | PRN
  Administered 2018-05-21 (×2): 25 ug via INTRAVENOUS

## 2018-05-21 MED ORDER — LABETALOL HCL 5 MG/ML IV SOLN
10.0000 mg | Freq: Once | INTRAVENOUS | Status: AC
  Administered 2018-05-21: 10 mg via INTRAVENOUS
  Filled 2018-05-21: qty 4

## 2018-05-21 MED ORDER — LIDOCAINE-EPINEPHRINE (PF) 1 %-1:200000 IJ SOLN
INTRAMUSCULAR | Status: AC
  Filled 2018-05-21: qty 30

## 2018-05-21 MED ORDER — ALTEPLASE 2 MG IJ SOLR
INTRAMUSCULAR | Status: AC
  Filled 2018-05-21: qty 4

## 2018-05-21 MED ORDER — HEPARIN SODIUM (PORCINE) 10000 UNIT/ML IJ SOLN
INTRAMUSCULAR | Status: AC
  Filled 2018-05-21: qty 1

## 2018-05-21 MED ORDER — FENTANYL CITRATE (PF) 100 MCG/2ML IJ SOLN
INTRAMUSCULAR | Status: AC
  Filled 2018-05-21: qty 2

## 2018-05-21 SURGICAL SUPPLY — 6 items
CATH PALINDROME RT-P 15FX19CM (CATHETERS) ×2 IMPLANT
DERMABOND ADVANCED (GAUZE/BANDAGES/DRESSINGS) ×1
DERMABOND ADVANCED .7 DNX12 (GAUZE/BANDAGES/DRESSINGS) ×1 IMPLANT
PACK ANGIOGRAPHY (CUSTOM PROCEDURE TRAY) ×2 IMPLANT
SUT MNCRL AB 4-0 PS2 18 (SUTURE) ×2 IMPLANT
SUT PROLENE 0 CT 1 30 (SUTURE) ×2 IMPLANT

## 2018-05-21 NOTE — Progress Notes (Signed)
PT Cancellation Note  Patient Details Name: Tracy Jennings MRN: [id number] DOB: 1956-04-11   Cancelled Treatment:    Reason Eval/Treat Not Completed: Patient at procedure or test/unavailable.  Pt currently off unit at dialysis.  Will re-attempt PT treatment at a later date/time.  Leitha Bleak, PT 05/21/18, 10:19 AM [phone]

## 2018-05-21 NOTE — Op Note (Signed)
OPERATIVE NOTE    PRE-OPERATIVE DIAGNOSIS: 1. ESRD   POST-OPERATIVE DIAGNOSIS: same as above  PROCEDURE: 1. Ultrasound guidance for vascular access to the right internal jugular vein 2. Fluoroscopic guidance for placement of catheter 3. Placement of a 19 cm tip to cuff tunneled hemodialysis catheter via the right internal jugular vein  SURGEON: Leotis Pain, MD  ANESTHESIA:  Local with Moderate conscious sedation for approximately 20 minutes using 2 mg of Versed and 50 mcg of Fentanyl  ESTIMATED BLOOD LOSS: 5 cc  FLUORO TIME: less than one minute  CONTRAST: none  FINDING(S): 1.  Patent right internal jugular vein  SPECIMEN(S):  None  INDICATIONS:   Tracy Jennings is a 62 y.o.female who presents with renal failure.  The patient needs long term dialysis access for their ESRD, and a Permcath is necessary.  Risks and benefits are discussed and informed consent is obtained.    DESCRIPTION: After obtaining full informed written consent, the patient was brought back to the vascular suited. The patient's right neck and chest were sterilely prepped and draped in a sterile surgical field was created. Moderate conscious sedation was administered during a face to face encounter with the patient throughout the procedure with my supervision of the RN administering medicines and monitoring the patient's vital signs, pulse oximetry, telemetry and mental status throughout from the start of the procedure until the patient was taken to the recovery room.  The right internal jugular vein was visualized with ultrasound and found to be patent. It was then accessed under direct ultrasound guidance and a permanent image was recorded. A wire was placed. After skin nick and dilatation, the peel-away sheath was placed over the wire. I then turned my attention to an area under the clavicle. Approximately 1-2 fingerbreadths below the clavicle a small counterincision was created and tunneled from the  subclavicular incision to the access site. Using fluoroscopic guidance, a 19 centimeter tip to cuff tunneled hemodialysis catheter was selected, and tunneled from the subclavicular incision to the access site. It was then placed through the peel-away sheath and the peel-away sheath was removed. Using fluoroscopic guidance the catheter tips were parked in the right atrium. The appropriate distal connectors were placed. It withdrew blood well and flushed easily with tpa (as there was a clinical concern for heparin antibody) and a concentrated heparin solution was then placed. It was secured to the chest wall with 2 Prolene sutures. The access incision was closed single 4-0 Monocryl. A 4-0 Monocryl pursestring suture was placed around the exit site. Sterile dressings were placed. The patient tolerated the procedure well and was taken to the recovery room in stable condition.  COMPLICATIONS: None  CONDITION: Stable  Leotis Pain, MD 05/21/2018 5:02 PM   This note was created with Dragon Medical transcription system. Any errors in dictation are purely unintentional.

## 2018-05-21 NOTE — Progress Notes (Signed)
Pre HD Assessment    05/21/18 0930  Neurological  Level of Consciousness Alert  Orientation Level Oriented X4  Respiratory  Respiratory Pattern Regular;Unlabored;Symmetrical  Chest Assessment Chest expansion symmetrical  Bilateral Breath Sounds Diminished  Cough Non-productive;Strong  Cardiac  Pulse Regular  Heart Sounds S1, S2  Jugular Venous Distention (JVD) No  ECG Monitor Yes  Cardiac Rhythm NSR  Vascular  R Radial Pulse +2  L Radial Pulse +2  R Dorsalis Pedis Pulse +2  L Dorsalis Pedis Pulse +2  Integumentary  Integumentary (WDL) X  Skin Color Appropriate for ethnicity  Skin Condition Dry  Skin Integrity Abrasion (On right thigh)  Musculoskeletal  Musculoskeletal (WDL) X  Generalized Weakness Yes  Gastrointestinal  Bowel Sounds Assessment Active  GU Assessment  Genitourinary (WDL) X (HD pt)  Psychosocial  Psychosocial (WDL) WDL  Patient Behaviors Appropriate for age;Appropriate for situation;Cooperative;Calm  Needs Expressed Physical  Emotional support given Given to patient

## 2018-05-21 NOTE — H&P (Signed)
[facility] History & Physical Update  The patient was interviewed and re-examined.  The patient's previous History and Physical has been reviewed and is unchanged.  She needs a permcath for long term access here and at discharge. There is no change in the plan of care. We plan to proceed with the scheduled procedure.  Leotis Pain, MD  05/21/2018, 3:52 PM

## 2018-05-21 NOTE — Progress Notes (Signed)
Inpatient Rehabilitation  Attempted to meet with patient; however, she was out of the room at a procedure.  I met with patient's spouse and discussed team's recommendation for IP Rehab.  We discussed admission criteria, our estimated length of stay, and anticipated need for supervision upon discharge.  Shared booklets, verbally reviewed insurance, and answered initial questions.  He reports that he will discuss it with his wife and they will call if question, but are likely in favor once she is cleared to get up and going with therapy.  Plan to follow for timing of patient/family decision, medical readiness, therapy tolerance, and insurance approval.  I will be off starting tomorrow so please plan for Danne Baxter to cover.  Call if questions, my phone will be forwarded.    Carmelia Roller., CCC/SLP Admission Coordinator  [facility]  Cell [phone]

## 2018-05-21 NOTE — Plan of Care (Signed)
  Problem: Education: Goal: Knowledge of General Education information will improve Description Including pain rating scale, medication(s)/side effects and non-pharmacologic comfort measures Outcome: Progressing   Problem: Health Behavior/Discharge Planning: Goal: Ability to manage health-related needs will improve Outcome: Progressing   Problem: Clinical Measurements: Goal: Ability to maintain clinical measurements within normal limits will improve Outcome: Progressing Goal: Will remain free from infection Outcome: Progressing Goal: Diagnostic test results will improve Outcome: Progressing Goal: Respiratory complications will improve Outcome: Progressing Goal: Cardiovascular complication will be avoided Outcome: Progressing   Problem: Activity: Goal: Risk for activity intolerance will decrease Outcome: Progressing   Problem: Nutrition: Goal: Adequate nutrition will be maintained Outcome: Progressing   Problem: Coping: Goal: Level of anxiety will decrease Outcome: Progressing   Problem: Elimination: Goal: Will not experience complications related to bowel motility Outcome: Progressing Goal: Will not experience complications related to urinary retention Outcome: Progressing   Problem: Pain Managment: Goal: General experience of comfort will improve Outcome: Progressing   Problem: Safety: Goal: Ability to remain free from injury will improve Outcome: Progressing   Problem: Skin Integrity: Goal: Risk for impaired skin integrity will decrease Outcome: Progressing   Problem: Education: Goal: Ability to manage disease process will improve Outcome: Progressing   Problem: Cardiac: Goal: Ability to achieve and maintain adequate cardiopulmonary perfusion will improve Outcome: Progressing   Problem: Neurologic: Goal: Promote progressive neurologic recovery Outcome: Progressing   Problem: Skin Integrity: Goal: Risk for impaired skin integrity will be  minimized. Outcome: Progressing   

## 2018-05-21 NOTE — Progress Notes (Signed)
PT Cancellation Note  Patient Details Name: MAKYNA NIEHOFF MRN: [id number] DOB: 1956-04-27   Cancelled Treatment:    Reason Eval/Treat Not Completed: Patient at procedure or test/unavailable.  Pt currently off unit at procedure.  Will re-attempt PT treatment at a later date/time.  Leitha Bleak, PT 05/21/18, 3:59 PM [phone]

## 2018-05-21 NOTE — Care Management (Signed)
Message left for Gunnar Fusi, Admission Coordinator, [place]

## 2018-05-21 NOTE — Progress Notes (Addendum)
HD Treatment End and Post HD Treatment  Pt tolerated treatment well. Her net UF was 25 and her BVP was 68.2. No complaints noted from pt. Report called and given to primary RN. Notified Cory Roughen that patient's blood pressure was elevated during dialysis. MD aware.    05/21/18 1300  Vital Signs  Temp 99.2 F (37.3 C)  Temp Source Oral  Pulse Rate 91  Pulse Rate Source Monitor  Resp 16  BP (!) 190/77  BP Location Right Arm  BP Method Automatic  Patient Position (if appropriate) Lying  Oxygen Therapy  SpO2 100 %  O2 Device Room Air  Pain Assessment  Pain Scale 0-10  Pain Score 0  Dialysis Weight  Weight 79 kg  Type of Weight Post-Dialysis  During Hemodialysis Assessment  Blood Flow Rate (mL/min) 400 mL/min  Arterial Pressure (mmHg) -160 mmHg  Venous Pressure (mmHg) 140 mmHg  Transmembrane Pressure (mmHg) 50 mmHg  Ultrafiltration Rate (mL/min) 140 mL/min  Dialysate Flow Rate (mL/min) 800 ml/min  Conductivity: Machine  13.8  HD Safety Checks Performed Yes  Intra-Hemodialysis Comments Tolerated well;Tx completed  Post-Hemodialysis Assessment  Rinseback Volume (mL) 250 mL  KECN 68.2 V  Dialyzer Clearance Lightly streaked  Duration of HD Treatment -hour(s) 3 hour(s)  Hemodialysis Intake (mL) 500 mL  UF Total -Machine (mL) 525 mL  Net UF (mL) 25 mL  Tolerated HD Treatment Yes  Fistula / Graft Left Other (Comment) Arteriovenous fistula  Placement Date/Time: 05/11/18 1153   Placed prior to admission: No  Orientation: Left  Access Location: (c) Other (Comment)  Access Type: Arteriovenous fistula  Site Condition Other (Comment) (covered with occlusive dressing)  Fistula / Graft Assessment Absent ;Thrill  Drainage Description None  Hemodialysis Catheter Left Femoral vein Triple-lumen  Placement Date/Time: 05/17/18 0300   Time Out: Correct patient;Correct procedure;Correct site  Maximum sterile barrier precautions: Hand hygiene;Sterile gown;Cap;Sterile gloves;Mask;Large sterile sheet   Site Prep: Chlorhexidine;Skin Prep Completely Dr...  Site Condition No complications  Blue Lumen Status Other (Comment)  Red Lumen Status Other (Comment)  Catheter fill solution 4% Sodium Citrate  Catheter fill volume (Arterial) 1.4 cc  Catheter fill volume (Venous) 1.4  Dressing Type Biopatch;Occlusive;Other (Comment) (lumens wrapped in gauze)  Dressing Status Clean;Dry;Intact  Post treatment catheter status Capped and Clamped

## 2018-05-21 NOTE — Progress Notes (Signed)
Post HD Assessment    05/21/18 1300  Neurological  Level of Consciousness Alert  Orientation Level Oriented X4  Respiratory  Respiratory Pattern Regular;Unlabored;Symmetrical  Chest Assessment Chest expansion symmetrical  Bilateral Breath Sounds Diminished  Cough Non-productive  Cardiac  Pulse Regular  Heart Sounds S1, S2  Jugular Venous Distention (JVD) No  ECG Monitor Yes  Cardiac Rhythm NSR  Vascular  R Radial Pulse +2  L Radial Pulse +2  R Dorsalis Pedis Pulse +2  L Dorsalis Pedis Pulse +2  Integumentary  Integumentary (WDL) X  Skin Color Appropriate for ethnicity  Skin Condition Dry  Skin Integrity Abrasion  Musculoskeletal  Musculoskeletal (WDL) X  Generalized Weakness Yes  Gastrointestinal  Bowel Sounds Assessment Active  Psychosocial  Psychosocial (WDL) WDL

## 2018-05-21 NOTE — Progress Notes (Signed)
[facility] [place] NAME: Tracy Jennings    MR#:  [id number]  DATE OF BIRTH:  08/09/1956  SUBJECTIVE:  Patient seen in dialysis she has no symptoms her mental status is improved  REVIEW OF SYSTEMS:    Review of Systems  Constitutional: Negative for fever, chills weight loss HENT: Negative for ear pain, nosebleeds, congestion, facial swelling, rhinorrhea, neck pain, neck stiffness and ear discharge.   Respiratory: Negative for cough, shortness of breath, wheezing  Cardiovascular: Negative for chest pain, palpitations and leg swelling.  Gastrointestinal: Negative for heartburn, abdominal pain, vomiting, diarrhea or consitpation Genitourinary: Negative for dysuria, urgency, frequency, hematuria Musculoskeletal: Negative for back pain or joint pain Neurological: Negative for dizziness, seizures, syncope, focal weakness,  numbness and headaches.  Hematological: Does not bruise/bleed easily.  Psychiatric/Behavioral: Negative for hallucinations, confusion, dysphoric mood    Tolerating Diet: npo      DRUG ALLERGIES:   Allergies  Allergen Reactions  . Hctz [Hydrochlorothiazide]     pancreatitis  . Amlodipine Swelling    Knees down to ankles  . Amlodipine Besylate Swelling  . Sulfa Antibiotics Rash  . Dairy Aid [Lactase]     Runny nose    VITALS:  Blood pressure (!) 195/71, pulse 90, temperature 99.4 F (37.4 C), temperature source Oral, resp. rate 20, height 5' 3.5" (1.613 m), weight 79.4 kg, SpO2 99 %.  PHYSICAL EXAMINATION:  Constitutional: Appears well-developed and well-nourished. No distress. HENT: Normocephalic. Marland Kitchen Oropharynx is clear and moist.  Eyes: Conjunctivae and EOM are normal. PERRLA, no scleral icterus.  Neck: Normal ROM. Neck supple. No JVD. No tracheal deviation. CVS: RRR, S1/S2 +, no murmurs, no gallops, no carotid bruit.  Pulmonary: Effort and breath sounds normal, no stridor, rhonchi, wheezes, rales.   Abdominal: Soft. BS +,  no distension, tenderness, rebound or guarding.  Musculoskeletal: Normal range of motion. No edema and no tenderness.  Neuro: Alert. CN 2-12 grossly intact. No focal deficits. Skin: Skin is warm and dry. No rash noted. Psychiatric: Normal mood and affect.      LABORATORY PANEL:   CBC Recent Labs  Lab 05/21/18 0445  WBC 9.2  HGB 8.5*  HCT 24.7*  PLT 175   ------------------------------------------------------------------------------------------------------------------  Chemistries  Recent Labs  Lab 05/16/18 0502  05/21/18 0445  NA 150*   < > 143  K 4.0   < > 3.0*  CL 118*   < > 102  CO2 21*   < > 32  GLUCOSE 123*   < > 113*  BUN 62*   < > 29*  CREATININE 5.78*   < > 3.81*  CALCIUM 8.5*   < > 8.0*  MG 2.1  --   --    < > = values in this interval not displayed.   ------------------------------------------------------------------------------------------------------------------  Cardiac Enzymes No results for input(s): TROPONINI in the last 168 hours. ------------------------------------------------------------------------------------------------------------------  RADIOLOGY:  Dg Chest 2 View  Result Date: 05/20/2018 CLINICAL DATA:  62 year old female with a history of shortness of breath EXAM: CHEST - 2 VIEW COMPARISON:  05/16/2018, 05/15/2018 FINDINGS: Cardiomediastinal silhouette within normal limits in size and contour. No pneumothorax or pleural effusion. No confluent airspace disease. No evidence of central vascular congestion. No interlobular septal thickening. No displaced fracture.  Degenerative changes of the spine IMPRESSION: Negative for acute cardiopulmonary disease Electronically Signed   By: Corrie Mckusick D.O.   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  1.  Acute metabolic encephalopathy due to hypoxic/anoxic brain injury and uremia: Patient's mental status is much improved. Patient was evaluated by neurology  2.  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  Essential hypertension: Continue Cardizem  4.  Anemia of chronic kidney disease: Continue Epogen as recommended by nephrology  5.  Pneumonia while in the ICU: This has been treated with cefepime.  6.  Peripheral vascular disease: Continue Plavix and statin   Awaiting PT consultation for discharge planning   Management plans discussed with the patient and she is in agreement.  CODE STATUS: Full  TOTAL TIME TAKING CARE OF THIS PATIENT:23 minutes.     POSSIBLE D/C tomorrow, DEPENDING ON CLINICAL CONDITION.   Jagar Lua M.D on 05/21/2018 at 12:31 PM  Between 7am to 6pm - Pager - [phone] After 6pm go to [email or web address] - password [email or web address] [facility]  Office  [phone]  CC: Primary care physician; No primary care provider on file.  Note: This dictation was prepared with Dragon dictation along with smaller phrase technology. Any transcriptional errors that result from this process are unintentional.

## 2018-05-21 NOTE — Care Management (Signed)
Patient admitted with Acute metabolic encephalopathy due to hypoxic/anoxic brain injury and uremia.  Mental Status improving.   Patient sitting up in chair with her husband at bedside.  Patient lives at home with her husband.  PCP Eulogio Bear.  [facility], denies any issues obtaining medications.  At baseline patient is independent and had no assistive devices.  Outpatient HD arrangements pending.  PT has evaluated patient and recommends CIR.  Patient and husband would like to purse at this time.  Gunnar Fusi, Admission Coordinator, [place] to meet with patient today.  RNCM following

## 2018-05-21 NOTE — Progress Notes (Signed)
Pre HD Treatment    05/21/18 0933  Vital Signs  Temp 99.4 F (37.4 C)  Temp Source Oral  Pulse Rate 94  Pulse Rate Source Monitor  Resp (!) 22  BP (!) 206/77  BP Location Right Arm  BP Method Automatic  Patient Position (if appropriate) Lying  Oxygen Therapy  SpO2 97 %  O2 Device Room Air  Pain Assessment  Pain Scale 0-10  Pain Score 0  POSS Scale ([place]ero Opioid Sedation Scale)  POSS *See Group Information* 1-Acceptable,Awake and alert  Dialysis Weight  Weight 79.4 kg  Type of Weight Pre-Dialysis  Time-Out for Hemodialysis  What Procedure? HD  Pt Identifiers(min of two) First/Last Name;MRN/Account#;Pt's DOB(use if MRN/Acct# not available  Correct Site? Yes  Correct Side? Yes  Correct Procedure? Yes  Consents Verified? Yes  Rad Studies Available? N/A  Safety Precautions Reviewed? Yes  Engineer, civil (consulting) Number [id number]  Station Number 1  UF/Alarm Test Passed  Conductivity: Meter 14  Conductivity: Machine  14  pH 7.4  Reverse Osmosis Main  Normal Saline Lot Number [id number]  Dialyzer Lot Number [id number]  Disposable Set Lot Number [id number]  Machine Temperature 98.6 F (37 C)  Musician and Audible Yes  Blood Lines Intact and Secured Yes  Pre Treatment Patient Checks  Vascular access used during treatment Catheter  Hepatitis B Surface Antigen Results Negative  Date Hepatitis B Surface Antigen Drawn 05/17/18  Hepatitis B Surface Antibody  (< 10)  Date Hepatitis B Surface Antibody Drawn 05/17/18  Hemodialysis Consent Verified Yes  Hemodialysis Standing Orders Initiated Yes  ECG (Telemetry) Monitor On Yes  Prime Ordered Normal Saline  Length of  DialysisTreatment -hour(s) 3 Hour(s)  Dialyzer Elisio 17H NR  Dialysate 3K, 2.5 Ca  Dialysis Anticoagulant None  Dialysate Flow Ordered 800  Blood Flow Rate Ordered 400 mL/min  Ultrafiltration Goal 0 Liters  Pre Treatment Labs Other (Comment)  Dialysis Blood Pressure Support Ordered Normal Saline   Education / Care Plan  Dialysis Education Provided Yes  Documented Education in Care Plan Yes  Fistula / Graft Left Other (Comment) Arteriovenous fistula  Placement Date/Time: 05/11/18 1153   Placed prior to admission: No  Orientation: Left  Access Location: (c) Other (Comment)  Access Type: Arteriovenous fistula  Site Condition Other (Comment)  Fistula / Graft Assessment Absent ;Thrill  Hemodialysis Catheter Left Femoral vein Triple-lumen  Placement Date/Time: 05/17/18 0300   Time Out: Correct patient;Correct procedure;Correct site  Maximum sterile barrier precautions: Hand hygiene;Sterile gown;Cap;Sterile gloves;Mask;Large sterile sheet  Site Prep: Chlorhexidine;Skin Prep Completely Dr...  Site Condition No complications  Blue Lumen Status Heparin locked  Red Lumen Status Heparin locked  Catheter fill solution Heparin 1000 units/ml  Catheter fill volume (Arterial) 1.4 cc  Catheter fill volume (Venous) 1.4  Dressing Type Biopatch;Occlusive  Dressing Status Clean;Dry;Intact

## 2018-05-21 NOTE — Progress Notes (Signed)
HD Treatment Initiated    05/21/18 0945  Vital Signs  Pulse Rate 92  Pulse Rate Source Monitor  Resp 18  BP (!) 209/78  BP Location Right Arm  BP Method Automatic  Patient Position (if appropriate) Lying  Oxygen Therapy  SpO2 99 %  O2 Device Room Air  During Hemodialysis Assessment  Blood Flow Rate (mL/min) 350 mL/min  Arterial Pressure (mmHg) -140 mmHg  Venous Pressure (mmHg) 120 mmHg  Transmembrane Pressure (mmHg) 50 mmHg  Ultrafiltration Rate (mL/min) 170 mL/min  Dialysate Flow Rate (mL/min) 800 ml/min  Conductivity: Machine  14  HD Safety Checks Performed Yes  Dialysis Fluid Bolus Normal Saline  Bolus Amount (mL) 250 mL  Intra-Hemodialysis Comments Tx initiated;Progressing as prescribed  Hemodialysis Catheter Left Femoral vein Triple-lumen  Placement Date/Time: 05/17/18 0300   Time Out: Correct patient;Correct procedure;Correct site  Maximum sterile barrier precautions: Hand hygiene;Sterile gown;Cap;Sterile gloves;Mask;Large sterile sheet  Site Prep: Chlorhexidine;Skin Prep Completely Dr...  Blue Lumen Status Infusing  Red Lumen Status Infusing

## 2018-05-21 NOTE — Progress Notes (Signed)
OT Cancellation Note  Patient Details Name: Tracy Jennings MRN: [id number] DOB: February 17, 1956   Cancelled Treatment:    Reason Eval/Treat Not Completed: Patient at procedure or test/ unavailable. Pt preparing for transport for permcath placement, per RN. Pt unavailable for OT tx this date, will re-attempt next date as pt is medically appropriate.   [name], MPH, MS, OTR/L ascom [phone] 05/21/18, 3:25 PM

## 2018-05-21 NOTE — Progress Notes (Signed)
[facility]  ROUNDING NOTE   Subjective:  Patient seen and evaluated during hemodialysis. Tolerating well. Outpatient dialysis center placement still pending.   Objective:  Vital signs in last 24 hours:  Temp:  [98.1 F (36.7 C)-99.4 F (37.4 C)] 99.4 F (37.4 C) (09/09 0933) Pulse Rate:  [89-94] 89 (09/09 1130) Resp:  [16-22] 16 (09/09 1130) BP: (166-209)/(67-87) 191/74 (09/09 1130) SpO2:  [93 %-100 %] 100 % (09/09 1130) Weight:  [79.4 kg-79.7 kg] 79.4 kg (09/09 0933)  Weight change: -3.2 kg Filed Weights   05/19/18 1749 05/21/18 0500 05/21/18 0933  Weight: 82.8 kg 79.7 kg 79.4 kg    Intake/Output: I/O last 3 completed shifts: In: 270 [P.O.:240; I.V.:30] Out: 800 [Urine:800]   Intake/Output this shift:  Total I/O In: -  Out: 500 [Urine:500]  Physical Exam: General:  No acute distress, laying in the bed  Head:  Moist oral mucus membranes  Eyes: Anicteric   Neck: Supple  Lungs:  normal breathing effort, clear  Heart: Regular rate and rhythm  Abdomen:  Soft, nontender  Extremities: + dependent peripheral edema.  Neurologic:  Alert and oreinted  Skin: No lesions  Access: Left AVF - no thrill or bruit appreciated. Left femoral temp cath    Basic Metabolic Panel: Recent Labs  Lab 05/16/18 0502 05/17/18 0530 05/17/18 1144 05/19/18 0748 05/20/18 0729 05/21/18 0445  NA 150* 148*  --  143 143 143  K 4.0 4.5  --  3.5 3.3* 3.0*  CL 118* 118*  --  105 103 102  CO2 21* 18*  --  28 29 32  GLUCOSE 123* 153*  --  98 90 113*  BUN 62* 74*  --  32* 20 29*  CREATININE 5.78* 6.50*  --  3.55* 2.94* 3.81*  CALCIUM 8.5* 8.2*  --  7.5* 7.7* 8.0*  MG 2.1  --   --   --   --   --   PHOS  --   --  7.3*  --   --   --     Liver Function Tests: Recent Labs  Lab 05/14/18 2000  ALBUMIN 1.7*   No results for input(s): LIPASE, AMYLASE in the last 168 hours. No results for input(s): AMMONIA in the last 168 hours.  CBC: Recent Labs  Lab 05/17/18 0530  05/19/18 0748 05/21/18 0445  WBC 11.1* 9.9 9.2  NEUTROABS 9.4*  --   --   HGB 8.3* 8.8* 8.5*  HCT 25.7* 25.5* 24.7*  MCV 90.5 88.6 88.8  PLT 248 186 175    Cardiac Enzymes: No results for input(s): CKTOTAL, CKMB, CKMBINDEX, TROPONINI in the last 168 hours.  BNP: Invalid input(s): POCBNP  CBG: Recent Labs  Lab 05/20/18 0731 05/20/18 1213 05/20/18 1651 05/20/18 2142 05/21/18 0825  GLUCAP 44 111* 143* 52* 112*    Microbiology: Results for orders placed or performed during the hospital encounter of 05/11/18  MRSA PCR Screening     Status: None   Collection Time: 05/11/18  7:56 PM  Result Value Ref Range Status   MRSA by PCR NEGATIVE NEGATIVE Final    Comment:        The GeneXpert MRSA Assay (FDA approved for NASAL specimens only), is one component of a comprehensive MRSA colonization surveillance program. It is not intended to diagnose MRSA infection nor to guide or monitor treatment for MRSA infections. Performed at [facility], [address] [place], [place] [address]   CULTURE, BLOOD (ROUTINE X 2) w Reflex to  ID Panel     Status: None   Collection Time: 05/13/18  3:18 PM  Result Value Ref Range Status   Specimen Description BLOOD BFOA  Final   Special Requests   Final    BOTTLES DRAWN AEROBIC AND ANAEROBIC Blood Culture adequate volume   Culture   Final    NO GROWTH 5 DAYS Performed at [facility], [address] [place], [place] [address]    Report Status 05/18/2018 FINAL  Final  CULTURE, BLOOD (ROUTINE X 2) w Reflex to ID Panel     Status: None   Collection Time: 05/13/18  4:33 PM  Result Value Ref Range Status   Specimen Description BLOOD L HAND  Final   Special Requests   Final    BOTTLES DRAWN AEROBIC AND ANAEROBIC Blood Culture adequate volume   Culture   Final    NO GROWTH 5 DAYS Performed at [facility], [address] [place], [place] [address]    Report Status 05/18/2018 FINAL  Final    Coagulation  Studies: No results for input(s): LABPROT, INR in the last 72 hours.  Urinalysis: No results for input(s): COLORURINE, LABSPEC, PHURINE, GLUCOSEU, HGBUR, BILIRUBINUR, KETONESUR, PROTEINUR, UROBILINOGEN, NITRITE, LEUKOCYTESUR in the last 72 hours.  Invalid input(s): APPERANCEUR    Imaging: Dg Chest 2 View  Result Date: 05/20/2018 CLINICAL DATA:  62 year old female with a history of shortness of breath EXAM: CHEST - 2 VIEW COMPARISON:  05/16/2018, 05/15/2018 FINDINGS: Cardiomediastinal silhouette within normal limits in size and contour. No pneumothorax or pleural effusion. No confluent airspace disease. No evidence of central vascular congestion. No interlobular septal thickening. No displaced fracture.  Degenerative changes of the spine IMPRESSION: Negative for acute cardiopulmonary disease Electronically Signed   By: Corrie Mckusick D.O.   On: 05/20/2018 11:29     Medications:   . anticoagulant sodium citrate    .  ceFAZolin (ANCEF) IV     . aspirin  81 mg Per Tube Daily  . atorvastatin  40 mg Oral q1800  . Chlorhexidine Gluconate Cloth  6 each Topical Q0600  . clopidogrel  75 mg Oral Daily  . diltiazem  120 mg Oral Daily  . docusate  100 mg Per Tube BID  . famotidine  20 mg Per Tube QHS  . feeding supplement (NEPRO CARB STEADY)  237 mL Oral BID BM  . insulin aspart  0-9 Units Subcutaneous TID WC  . sodium chloride flush  10-40 mL Intracatheter Q12H  . torsemide  40 mg Oral Daily   acetaminophen **OR** acetaminophen, hydrALAZINE, ipratropium-albuterol, sodium chloride flush, sorbitol  Assessment/ Plan:  [name] is a 62 y.o. black female with dialysis mellitus type II insulin dependent, hypertension, hyperlipidemia, peripheral vascular disease, chronic pancreatitis, anemia of chronic kidney disease admitted on 05/11/2018 for AVF creation by Dr. Delana Meyer. Post operative complication of cardiac arrest  1. Likely progressed to ESRD Acute renal failure on Chronic kidney  disease stage V: with metabolic acidosis. With history of nephrotic range proteinuria.  Baseline creatinine of 4.68, GFR of 11.  Chronic kidney disease secondary to diabetic nephropathy. Discussed various modalities of RRT with patient including HD and PD. Patient chose HD at present. Transplant referral as outpatient -Patient seen and evaluated during hemodialysis.  Tolerating well.  We will need to obtain PermCath once antibiotics have been stopped.   2.  Cardiac arrest: placed on hypothermic protocol.  - Appreciate cardiology input  3. Acute resp failure with volume overload -Reading comfortably  at the moment.  Continue to monitor respiratory status over the course of hospitalization.  4. Anemia of chronic kidney disease: normocytic.  Sitter starting Epogen as an outpatient.  Holding now secondary to cardiac event.  5. Diabetes mellitus type II with chronic kidney disease: insulin dependent. History of poor control. Hemoglobin A1c 6.6% on 02/26/18.   6. Hypernatremia Improved   LOS: 10 Tracy Jennings 9/9/201911:37 AM

## 2018-05-21 NOTE — Progress Notes (Signed)
Patient clinically stable post perm cath placement per Dr Lucky Cowboy. Drinking po's without difficulty, denies complaints. Dr Lucky Cowboy out to speak with patient with questions answered regarding procedure. Report called to [facility], care nurse with questions answered.

## 2018-05-21 NOTE — Progress Notes (Signed)
Inpatient Rehabilitation  Have been following along for medical stability and therapy tolerance as well as patient/family decision.  Discussed case with case Freight forwarder, Colletta Maryland.  Plan to visit with patient later today for assessment.  Call if questions.   Carmelia Roller., CCC/SLP Admission Coordinator  [place]  Cell [phone]

## 2018-05-21 NOTE — Progress Notes (Signed)
OT Cancellation Note  Patient Details Name: Tracy Jennings MRN: [id number] DOB: October 12, 1955   Cancelled Treatment:    Reason Eval/Treat Not Completed: Patient at procedure or test/ unavailable. Pt out of the room for dialysis. Will re-attempt OT tx at later date/time as pt is available and medically appropriate.   [name], MPH, MS, OTR/L ascom [phone] 05/21/18, 10:52 AM

## 2018-05-22 ENCOUNTER — Inpatient Hospital Stay (HOSPITAL_COMMUNITY): Payer: Managed Care, Other (non HMO) | Admitting: Physical Therapy

## 2018-05-22 ENCOUNTER — Encounter: Payer: Self-pay | Admitting: Vascular Surgery

## 2018-05-22 LAB — GLUCOSE, CAPILLARY
Glucose-Capillary: 155 mg/dL — ABNORMAL HIGH (ref 70–99)
Glucose-Capillary: 152 mg/dL — ABNORMAL HIGH (ref 70–99)
Glucose-Capillary: 142 mg/dL — ABNORMAL HIGH (ref 70–99)
Glucose-Capillary: 128 mg/dL — ABNORMAL HIGH (ref 70–99)

## 2018-05-22 LAB — HEPARIN INDUCED PLATELET AB (HIT ANTIBODY): Heparin Induced Plt Ab: 0.587 OD — ABNORMAL HIGH (ref 0.000–0.400)

## 2018-05-22 MED ORDER — RENA-VITE PO TABS
1.0000 | ORAL_TABLET | Freq: Every day | ORAL | Status: DC
  Administered 2018-05-22 – 2018-05-24 (×3): 1 via ORAL
  Filled 2018-05-22 (×3): qty 1

## 2018-05-22 MED ORDER — ADULT MULTIVITAMIN W/MINERALS CH
1.0000 | ORAL_TABLET | Freq: Every day | ORAL | Status: DC
  Administered 2018-05-22 – 2018-05-25 (×4): 1 via ORAL
  Filled 2018-05-22 (×5): qty 1

## 2018-05-22 NOTE — Progress Notes (Signed)
Tracy Jennings at [place] NAME: Tracy Jennings    MR#:  [id number]  DATE OF BIRTH:  [birth year]  SUBJECTIVE:  Patient had a little bleeding from PermCath site early this morning.  She is doing well otherwise  REVIEW OF SYSTEMS:    Review of Systems  Constitutional: Negative for fever, chills weight loss HENT: Negative for ear pain, nosebleeds, congestion, facial swelling, rhinorrhea, neck pain, neck stiffness and ear discharge.   Respiratory: Negative for cough, shortness of breath, wheezing  Cardiovascular: Negative for chest pain, palpitations and leg swelling.  Gastrointestinal: Negative for heartburn, abdominal pain, vomiting, diarrhea or consitpation Genitourinary: Negative for dysuria, urgency, frequency, hematuria Musculoskeletal: Negative for back pain or joint pain Neurological: Negative for dizziness, seizures, syncope, focal weakness,  numbness and headaches.  Hematological: Does not bruise/bleed easily.  Psychiatric/Behavioral: Negative for hallucinations, confusion, dysphoric mood    Tolerating Diet: yes      DRUG ALLERGIES:   Allergies  Allergen Reactions  . Hctz [Hydrochlorothiazide]     pancreatitis  . Amlodipine Swelling    Knees down to ankles  . Amlodipine Besylate Swelling  . Sulfa Antibiotics Rash  . Heparin   . Dairy Aid [Lactase]     Runny nose    VITALS:  Blood pressure (!) 161/84, pulse 87, temperature 98.5 F (36.9 C), temperature source Oral, resp. rate 20, height 5' 3.5" (1.613 m), weight 79 kg, SpO2 100 %.  PHYSICAL EXAMINATION:  Constitutional: Appears well-developed and well-nourished. No distress. HENT: Normocephalic. Marland Kitchen Oropharynx is clear and moist.  Eyes: Conjunctivae and EOM are normal. PERRLA, no scleral icterus.  Neck: Normal ROM. Neck supple. No JVD. No tracheal deviation. CVS: RRR, S1/S2 +, no murmurs, no gallops, no carotid bruit.  Pulmonary: Effort and breath sounds normal, no stridor,  rhonchi, wheezes, rales.  Abdominal: Soft. BS +,  no distension, tenderness, rebound or guarding.  Musculoskeletal: Normal range of motion. No edema and no tenderness.  Neuro: Alert. CN 2-12 grossly intact. No focal deficits. Skin: Skin is warm and dry. No rash noted. Psychiatric: Normal mood and affect.      LABORATORY PANEL:   CBC Recent Labs  Lab 05/21/18 0445  WBC 9.2  HGB 8.5*  HCT 24.7*  PLT 175   ------------------------------------------------------------------------------------------------------------------  Chemistries  Recent Labs  Lab 05/16/18 0502  05/21/18 0445  NA 150*   < > 143  K 4.0   < > 3.0*  CL 118*   < > 102  CO2 21*   < > 32  GLUCOSE 123*   < > 113*  BUN 62*   < > 29*  CREATININE 5.78*   < > 3.81*  CALCIUM 8.5*   < > 8.0*  MG 2.1  --   --    < > = values in this interval not displayed.   ------------------------------------------------------------------------------------------------------------------  Cardiac Enzymes No results for input(s): TROPONINI in the last 168 hours. ------------------------------------------------------------------------------------------------------------------  RADIOLOGY:  No results found.   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  1.  Acute metabolic encephalopathy due to hypoxic/anoxic brain injury and uremia: Patient's mental status is much improved and appears at baseline. Patient was evaluated by neurology  2.  Chronic kidney disease stage IV which is progressed to end-stage renal disease: Patient will continue dialysis as per nephrology and she has  PermCath placed. We will need to await outpatient dialysis schedule   3.  Essential hypertension: Continue Cardizem  4.  Anemia of chronic kidney disease: Continue  Epogen as recommended by nephrology  5.  Pneumonia while in the ICU: This has been treated with cefepime.  6.  Peripheral vascular disease: Continue Plavix and statin   She will require insurance approval for CIR once we have dialysis outpatient scheduled   Management plans discussed with the patient and she is in agreement.  CODE STATUS: Full  TOTAL TIME TAKING CARE OF THIS PATIENT:22 minutes.     POSSIBLE D/C tomorrow, DEPENDING ON CLINICAL CONDITION.   Tracy Jennings M.D on 05/22/2018 at 11:09 AM  Between 7am to 6pm - Pager - [phone] After 6pm go to [email or web address] - password [email or web address] [facility]  Office  [phone]  CC: Primary care physician; No primary care provider on file.  Note: This dictation was prepared with Dragon dictation along with smaller phrase technology. Any transcriptional errors that result from this process are unintentional.

## 2018-05-22 NOTE — Progress Notes (Signed)
Chaplain followed up with patient. Patient husband was at bedside. Chaplain talked a few with patient and husband, then offered prayer for God continued blessing upon patient and family.

## 2018-05-22 NOTE — Progress Notes (Signed)
Physical Therapy Treatment Patient Details Name: Tracy Jennings MRN: [id number] DOB: 1956-07-22 Today's Date: 05/22/2018    History of Present Illness Pt admitted for complication for dialysis access. Pt initially admitted on 8/30 for placement for AV fistula and then had complication for thrombosis post op, went back for thrombectomy and had code blue requiring CPR requiring intubation. Pt extubated on 9/3. Per chart review, looks that pt's fistula is now occluded. HIstory includes CKD, DM, ESRD, HTN, PVD, and pancreatitis.     PT Comments    Pt agreeable to PT; denies pain. Reports BLE weakness. Pt participates in supine bed exercises only due to L temporary femoral catheter. Participates in range and strengthening exercises avoiding L hip flexion or manual resistance. Pt notes she has gotten to the chair and to the bathroom with the help of her spouse. Pt/spouse educated regarding femoral catheter restrictions and encouraged pt to call nursing for any mobility needs. Continue PT to progress strength as able.    Follow Up Recommendations  CIR     Equipment Recommendations       Recommendations for Other Services       Precautions / Restrictions Precautions Precautions: Fall Restrictions Weight Bearing Restrictions: No    Mobility  Bed Mobility               General bed mobility comments: Not tested; L temporary femoral catheter  Transfers                    Ambulation/Gait                 Stairs             Wheelchair Mobility    Modified Rankin (Stroke Patients Only)       Balance                                            Cognition Arousal/Alertness: Awake/alert Behavior During Therapy: WFL for tasks assessed/performed Overall Cognitive Status: Within Functional Limits for tasks assessed                                        Exercises General Exercises - Lower Extremity Ankle  Circles/Pumps: AROM;Both;20 reps;Supine Quad Sets: Strengthening;Both;20 reps;Supine Gluteal Sets: Strengthening;Both;20 reps;Supine Short Arc Quad: AROM;Right;20 reps;Supine Heel Slides: AROM;Right;20 reps;Supine(resisted extension) Hip ABduction/ADduction: Strengthening;Right;20 reps;Supine(L AROM; leg kept low) Straight Leg Raises: Strengthening;Right;10 reps;Supine(2 sets)    General Comments        Pertinent Vitals/Pain Pain Assessment: No/denies pain    Home Living                      Prior Function            PT Goals (current goals can now be found in the care plan section) Progress towards PT goals: Progressing toward goals    Frequency    Min 2X/week      PT Plan Current plan remains appropriate    Co-evaluation              AM-PAC PT "6 Clicks" Daily Activity  Outcome Measure  Difficulty turning over in bed (including adjusting bedclothes, sheets and blankets)?: A Little Difficulty moving from lying on back to sitting on the side  of the bed? : Unable Difficulty sitting down on and standing up from a chair with arms (e.g., wheelchair, bedside commode, etc,.)?: Unable Help needed moving to and from a bed to chair (including a wheelchair)?: Total Help needed walking in hospital room?: Total Help needed climbing 3-5 steps with a railing? : Total 6 Click Score: 8    End of Session   Activity Tolerance: Patient tolerated treatment well Patient left: in bed;with call bell/phone within reach;with bed alarm set;with family/visitor present   PT Visit Diagnosis: Muscle weakness (generalized) (M62.81);Difficulty in walking, not elsewhere classified (R26.2);Other symptoms and signs involving the nervous system (R29.898)     Time: 3475-8307 PT Time Calculation (min) (ACUTE ONLY): 16 min  Charges:  $Therapeutic Exercise: 8-22 mins                      Larae Grooms, PTA 05/22/2018, 4:13 PM

## 2018-05-22 NOTE — Plan of Care (Addendum)
The patient has been stable today. No more bleeding has occur over the new permanent HD cath placed yesterday on the right side of the chest this morning at 3 am . The patient has not complained about pain. Currently tolerating a regular diet.  Problem: Education: Goal: Knowledge of General Education information will improve Description Including pain rating scale, medication(s)/side effects and non-pharmacologic comfort measures Outcome: Progressing   Problem: Health Behavior/Discharge Planning: Goal: Ability to manage health-related needs will improve Outcome: Progressing   Problem: Clinical Measurements: Goal: Ability to maintain clinical measurements within normal limits will improve Outcome: Progressing Goal: Will remain free from infection Outcome: Progressing Goal: Diagnostic test results will improve Outcome: Progressing Goal: Respiratory complications will improve Outcome: Progressing Goal: Cardiovascular complication will be avoided Outcome: Progressing   Problem: Activity: Goal: Risk for activity intolerance will decrease Outcome: Progressing   Problem: Nutrition: Goal: Adequate nutrition will be maintained Outcome: Progressing   Problem: Coping: Goal: Level of anxiety will decrease Outcome: Progressing   Problem: Elimination: Goal: Will not experience complications related to bowel motility Outcome: Progressing Goal: Will not experience complications related to urinary retention Outcome: Progressing   Problem: Pain Managment: Goal: General experience of comfort will improve Outcome: Progressing   Problem: Safety: Goal: Ability to remain free from injury will improve Outcome: Progressing   Problem: Skin Integrity: Goal: Risk for impaired skin integrity will decrease Outcome: Progressing   Problem: Education: Goal: Ability to manage disease process will improve Outcome: Progressing   Problem: Cardiac: Goal: Ability to achieve and maintain adequate  cardiopulmonary perfusion will improve Outcome: Progressing   Problem: Neurologic: Goal: Promote progressive neurologic recovery Outcome: Progressing   Problem: Skin Integrity: Goal: Risk for impaired skin integrity will be minimized. Outcome: Progressing

## 2018-05-22 NOTE — Progress Notes (Signed)
[facility]  ROUNDING NOTE   Subjective:  Right IJ PermCath has been placed.  there is some mild oozing noted from this. Asian still a bit weak.   Objective:  Vital signs in last 24 hours:  Temp:  [98.4 F (36.9 C)-99.1 F (37.3 C)] 98.7 F (37.1 C) (09/10 1218) Pulse Rate:  [87-104] 90 (09/10 1218) Resp:  [16-20] 16 (09/10 1218) BP: (125-204)/(52-93) 125/52 (09/10 1218) SpO2:  [95 %-100 %] 95 % (09/10 1218) Weight:  [79 kg] 79 kg (09/09 1537)  Weight change: -0.3 kg Filed Weights   05/21/18 0933 05/21/18 1300 05/21/18 1537  Weight: 79.4 kg 79 kg 79 kg    Intake/Output: I/O last 3 completed shifts: In: 400 [P.O.:400] Out: 4818 [Urine:1750; Other:25]   Intake/Output this shift:  Total I/O In: 350 [P.O.:340; I.V.:10] Out: -   Physical Exam: General: No acute distress, laying in the bed  Head:  Moist oral mucus membranes  Eyes: Anicteric   Neck: Supple  Lungs:  normal breathing effort, clear  Heart: Regular rate and rhythm  Abdomen:  Soft, nontender  Extremities: + dependent peripheral edema.  Neurologic: Alert and oriented x 3  Skin: No lesions  Access: Left AVF - no thrill or bruit appreciated. Left femoral temp cath, R IJ PC    Basic Metabolic Panel: Recent Labs  Lab 05/16/18 0502 05/17/18 0530 05/17/18 1144 05/19/18 0748 05/20/18 0729 05/21/18 0445  NA 150* 148*  --  143 143 143  K 4.0 4.5  --  3.5 3.3* 3.0*  CL 118* 118*  --  105 103 102  CO2 21* 18*  --  28 29 32  GLUCOSE 123* 153*  --  98 90 113*  BUN 62* 74*  --  32* 20 29*  CREATININE 5.78* 6.50*  --  3.55* 2.94* 3.81*  CALCIUM 8.5* 8.2*  --  7.5* 7.7* 8.0*  MG 2.1  --   --   --   --   --   PHOS  --   --  7.3*  --   --   --     Liver Function Tests: No results for input(s): AST, ALT, ALKPHOS, BILITOT, PROT, ALBUMIN in the last 168 hours. No results for input(s): LIPASE, AMYLASE in the last 168 hours. No results for input(s): AMMONIA in the last 168 hours.  CBC: Recent  Labs  Lab 05/17/18 0530 05/19/18 0748 05/21/18 0445  WBC 11.1* 9.9 9.2  NEUTROABS 9.4*  --   --   HGB 8.3* 8.8* 8.5*  HCT 25.7* 25.5* 24.7*  MCV 90.5 88.6 88.8  PLT 248 186 175    Cardiac Enzymes: No results for input(s): CKTOTAL, CKMB, CKMBINDEX, TROPONINI in the last 168 hours.  BNP: Invalid input(s): POCBNP  CBG: Recent Labs  Lab 05/21/18 0825 05/21/18 1348 05/21/18 2045 05/22/18 0801 05/22/18 1145  GLUCAP 112* 41 131* 155* 152*    Microbiology: Results for orders placed or performed during the hospital encounter of 05/11/18  MRSA PCR Screening     Status: None   Collection Time: 05/11/18  7:56 PM  Result Value Ref Range Status   MRSA by PCR NEGATIVE NEGATIVE Final    Comment:        The GeneXpert MRSA Assay (FDA approved for NASAL specimens only), is one component of a comprehensive MRSA colonization surveillance program. It is not intended to diagnose MRSA infection nor to guide or monitor treatment for MRSA infections. Performed at [facility], [address],  [place], [place] [address]   CULTURE, BLOOD (ROUTINE X 2) w Reflex to ID Panel     Status: None   Collection Time: 05/13/18  3:18 PM  Result Value Ref Range Status   Specimen Description BLOOD BFOA  Final   Special Requests   Final    BOTTLES DRAWN AEROBIC AND ANAEROBIC Blood Culture adequate volume   Culture   Final    NO GROWTH 5 DAYS Performed at [facility], [address] [place], [place] [address]    Report Status 05/18/2018 FINAL  Final  CULTURE, BLOOD (ROUTINE X 2) w Reflex to ID Panel     Status: None   Collection Time: 05/13/18  4:33 PM  Result Value Ref Range Status   Specimen Description BLOOD L HAND  Final   Special Requests   Final    BOTTLES DRAWN AEROBIC AND ANAEROBIC Blood Culture adequate volume   Culture   Final    NO GROWTH 5 DAYS Performed at [facility], [address], [place], [place] [address]    Report Status 05/18/2018 FINAL   Final    Coagulation Studies: No results for input(s): LABPROT, INR in the last 72 hours.  Urinalysis: No results for input(s): COLORURINE, LABSPEC, PHURINE, GLUCOSEU, HGBUR, BILIRUBINUR, KETONESUR, PROTEINUR, UROBILINOGEN, NITRITE, LEUKOCYTESUR in the last 72 hours.  Invalid input(s): APPERANCEUR    Imaging: No results found.   Medications:   . anticoagulant sodium citrate     . aspirin  81 mg Per Tube Daily  . atorvastatin  40 mg Oral q1800  . Chlorhexidine Gluconate Cloth  6 each Topical Q0600  . clopidogrel  75 mg Oral Daily  . diltiazem  120 mg Oral Daily  . docusate  100 mg Per Tube BID  . famotidine  20 mg Per Tube QHS  . feeding supplement (NEPRO CARB STEADY)  237 mL Oral BID BM  . insulin aspart  0-9 Units Subcutaneous TID WC  . multivitamin  1 tablet Oral QHS  . multivitamin with minerals  1 tablet Oral Daily  . sodium chloride flush  10-40 mL Intracatheter Q12H  . torsemide  40 mg Oral Daily   acetaminophen **OR** acetaminophen, hydrALAZINE, ipratropium-albuterol, oxyCODONE-acetaminophen, sodium chloride flush, sorbitol  Assessment/ Plan:  Tracy Jennings is a 62 y.o. black female with dialysis mellitus type II insulin dependent, hypertension, hyperlipidemia, peripheral vascular disease, chronic pancreatitis, anemia of chronic kidney disease admitted on 05/11/2018 for AVF creation by Dr. Delana Meyer. Post operative complication of cardiac arrest  1. 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 With history of nephrotic range proteinuria.  Baseline creatinine of 4.68, GFR of 11.  Chronic kidney disease secondary to diabetic nephropathy. Discussed various modalities of RRT with patient including HD and PD. Patient chose HD at present. Transplant referral as outpatient - outpatient hemodialysis has been set up for [facility] dialysis unit. She will be going to dialysis on Tuesday, Thursday, Saturday  as an outpatient.  We will plan for dialysis again tomorrow and then she can start her normal outpatient dialysis sessions.  2.  Cardiac arrest: placed on hypothermic protocol.  - Appreciate cardiology input  3. Acute resp failure with volume overload -breathing comfortably on room air at the moment.  4. Anemia of chronic kidney disease: normocytic. Consider starting Epogen as an outpatient.  5. Diabetes mellitus type II with chronic kidney disease: insulin dependent. History of poor control. Hemoglobin A1c 6.6% on 02/26/18.  6. Hypernatremia Resolved, serum sodium 143.   LOS: 11 Mylz Yuan 9/10/20192:42 PM

## 2018-05-22 NOTE — Progress Notes (Signed)
Chaplain was rounded and visited the patient. Patient was glad to see Chaplain. Chaplain explained how she was with patient family through her process. Patient thank the Chaplain and she did not remember her. Chaplain encourage her that it was ok. Chaplain told patient she was here all day and would return to visit her. Patient was happy to hear that and she would like that. Chaplain told patient if she need her before she return she could have her page.

## 2018-05-22 NOTE — Care Management (Signed)
Prior to discharge. Patient will need to -Tolerate HD sitting - have temp cath removed - work with PT and OT after temp cath removed - Inpatient rehab will have to review notes, and submit for insurance auth - and insurance auth would have to be obtained prior to discharge to inpatient rehab  If inpatient rehab not approved by insurance, auth would have to be initiated for SNF, or patient would have to return home with home health  Patient outpatient HD confirmed.  Can Start Thursday 9/12 at [facility] at 11:40.  Per Tracy Jennings if patient does not discharge prior to Thursday, she will not be able to start at outpatient clinic over the weekend.

## 2018-05-22 NOTE — Progress Notes (Signed)
Nutrition Follow-up  DOCUMENTATION CODES:   Obesity unspecified  INTERVENTION:   Nepro Shake po BID, each supplement provides 425 kcal and 19 grams protein  Rena-vite daily   MVI daily   Liberal diet with phosphorus restriction   NUTRITION DIAGNOSIS:   Inadequate oral intake related to acute illness as evidenced by meal completion < 50%.  GOAL:   Patient will meet greater than or equal to 90% of their needs  MONITOR:   PO intake, Supplement acceptance, Weight trends, Labs, Skin, I & O's   ASSESSMENT:   62 y.o. black female with dialysis mellitus type II insulin dependent, hypertension, hyperlipidemia, peripheral vascular disease, chronic pancreatitis, anemia of chronic kidney disease admitted on 05/11/2018 for AVF placement w/ complications requiring thrombectomy and with post op cardiac arrest    Pt s/p right internal jugular cath placement 9/9   Pt with poor appetite and oral intake; eating <50% of meals and drinking small amounts of Nepro. Per chart, pt is weight stable since admit. Pt initiated on HD this admit. RD will liberalize diet to try and encourage po intake; will keep phosphorus restriction. RD will also add Rena-vite to replace losses from HD. Continue Nepro.   Medications reviewed and include: aspirin, plavix, colace, pepcid, insulin, torsemide   Labs reviewed: K 3.0(L), BUN 29(H), creat 3.81(H), Ca 8.0(L)- 9/9 P 7.3(H) Hgb 8.5(L), Hct 24.7(L) cbgs- 112, 84, 131, 155 x 24 hrs  Diet Order:   Diet Order            Diet renal/carb modified with fluid restriction Diet-HS Snack? Nothing; Fluid restriction: 1200 mL Fluid; Room service appropriate? Yes; Fluid consistency: Thin  Diet effective now             EDUCATION NEEDS:   No education needs have been identified at this time  Skin:  Skin Assessment: Skin Integrity Issues:(closed incision left arm)  Last BM:  9/9- type 3  Height:   Ht Readings from Last 1 Encounters:  05/21/18 5' 3.5" (1.613 m)     Weight:   Wt Readings from Last 1 Encounters:  05/21/18 79 kg    Ideal Body Weight:  53.4 kg  BMI:  Body mass index is 30.37 kg/m.  Estimated Nutritional Needs:   Kcal:  1640-1915 (MSJ x 1.2-1.4)  Protein:  80-95 grams (1-1.2 grams/kg)  Fluid:  1.6-1.9 L/day  Koleen Distance MS, RD, LDN Pager #- [phone] Office#- [phone] After Hours Pager: [phone]

## 2018-05-22 NOTE — Progress Notes (Signed)
Inpatient Rehabilitation Admissions Coordinator  I await PT and OT evaluations once patient able to mobilize out of bed before proceeding with insurance authorization for a possible inpt rehab admit at [facility] in [place]. I will follow.  Danne Baxter, RN, MSN Rehab Admissions Coordinator [phone] 05/22/2018 1:33 PM

## 2018-05-22 NOTE — Progress Notes (Signed)
OT Cancellation Note  Patient Details Name: Tracy Jennings MRN: [id number] DOB: 1956/01/02   Cancelled Treatment:    Reason Eval/Treat Not Completed: Other (comment). Upon attempt to treat, pt seated in recliner starting to eat breakfast. Pt noting IJ permcath location is "sore" as well as the temporary femoral cath location but denies significant pain. Pt agreeable to OT tx at later time this date. Of note, per therapy guidelines, pt restricted to bed rest level activity while temporary femoral cath is still in place. Will re-attempt as schedule permits and as pt is medically appropriate.   [name], MPH, MS, OTR/L ascom [phone] 05/22/18, 9:26 AM

## 2018-05-23 ENCOUNTER — Inpatient Hospital Stay (HOSPITAL_COMMUNITY): Payer: Managed Care, Other (non HMO) | Admitting: Physical Therapy

## 2018-05-23 LAB — GLUCOSE, CAPILLARY
Glucose-Capillary: 128 mg/dL — ABNORMAL HIGH (ref 70–99)
Glucose-Capillary: 247 mg/dL — ABNORMAL HIGH (ref 70–99)
Glucose-Capillary: 130 mg/dL — ABNORMAL HIGH (ref 70–99)

## 2018-05-23 LAB — PHOSPHORUS: Phosphorus: 3.4 mg/dL (ref 2.5–4.6)

## 2018-05-23 MED ORDER — EPOETIN ALFA 10000 UNIT/ML IJ SOLN
10000.0000 [IU] | INTRAMUSCULAR | Status: DC
  Administered 2018-05-23 – 2018-05-24 (×2): 10000 [IU] via INTRAVENOUS

## 2018-05-23 MED ORDER — TRAMADOL HCL 50 MG PO TABS: 50.0000 mg | ORAL_TABLET | Freq: Four times a day (QID) | ORAL | Status: DC | PRN

## 2018-05-23 MED ORDER — HYDRALAZINE HCL 50 MG PO TABS
50.0000 mg | ORAL_TABLET | Freq: Three times a day (TID) | ORAL | Status: DC
  Administered 2018-05-23 – 2018-05-25 (×7): 50 mg via ORAL
  Filled 2018-05-23 (×8): qty 1

## 2018-05-23 NOTE — Progress Notes (Signed)
[facility]  ROUNDING NOTE   Subjective:  Patient due for hemodialysis today.  Orders have been prepared.  She did work with physical therapy yesterday.   Objective:  Vital signs in last 24 hours:  Temp:  [98.4 F (36.9 C)-99.3 F (37.4 C)] 99.3 F (37.4 C) (09/11 1000) Pulse Rate:  [80-100] 95 (09/11 1000) Resp:  [16-18] 18 (09/11 0433) BP: (125-192)/(52-82) 171/82 (09/11 1000) SpO2:  [95 %-100 %] 99 % (09/11 1000) Weight:  [76 kg-79.5 kg] 78.3 kg (09/11 1000)  Weight change: 0.1 kg Filed Weights   05/23/18 0433 05/23/18 0526 05/23/18 1000  Weight: 79.5 kg 76 kg 78.3 kg    Intake/Output: I/O last 3 completed shifts: In: 81 [P.O.:640; I.V.:10] Out: 1300 [Urine:1300]   Intake/Output this shift:  Total I/O In: -  Out: 500 [Urine:500]  Physical Exam: General: No acute distress, laying in the bed  Head: Moist oral mucus membranes  Eyes: Anicteric   Neck: Supple  Lungs:  normal breathing effort, clear bilateral  Heart: S1S2 no rubs  Abdomen:  Soft, nontender  Extremities: + dependent peripheral edema.  Neurologic: Alert and oriented x 3  Skin: No lesions  Access: Left AVF - no thrill or bruit appreciated. Left femoral temp cath, R IJ PC    Basic Metabolic Panel: Recent Labs  Lab 05/17/18 0530 05/17/18 1144 05/19/18 0748 05/20/18 0729 05/21/18 0445  NA 148*  --  143 143 143  K 4.5  --  3.5 3.3* 3.0*  CL 118*  --  105 103 102  CO2 18*  --  28 29 32  GLUCOSE 153*  --  98 90 113*  BUN 74*  --  32* 20 29*  CREATININE 6.50*  --  3.55* 2.94* 3.81*  CALCIUM 8.2*  --  7.5* 7.7* 8.0*  PHOS  --  7.3*  --   --   --     Liver Function Tests: No results for input(s): AST, ALT, ALKPHOS, BILITOT, PROT, ALBUMIN in the last 168 hours. No results for input(s): LIPASE, AMYLASE in the last 168 hours. No results for input(s): AMMONIA in the last 168 hours.  CBC: Recent Labs  Lab 05/17/18 0530 05/19/18 0748 05/21/18 0445  WBC 11.1* 9.9 9.2  NEUTROABS  9.4*  --   --   HGB 8.3* 8.8* 8.5*  HCT 25.7* 25.5* 24.7*  MCV 90.5 88.6 88.8  PLT 248 186 175    Cardiac Enzymes: No results for input(s): CKTOTAL, CKMB, CKMBINDEX, TROPONINI in the last 168 hours.  BNP: Invalid input(s): POCBNP  CBG: Recent Labs  Lab 05/22/18 0801 05/22/18 1145 05/22/18 1648 05/22/18 2108 05/23/18 0719  GLUCAP 155* 152* 142* 128* 128*    Microbiology: Results for orders placed or performed during the hospital encounter of 05/11/18  MRSA PCR Screening     Status: None   Collection Time: 05/11/18  7:56 PM  Result Value Ref Range Status   MRSA by PCR NEGATIVE NEGATIVE Final    Comment:        The GeneXpert MRSA Assay (FDA approved for NASAL specimens only), is one component of a comprehensive MRSA colonization surveillance program. It is not intended to diagnose MRSA infection nor to guide or monitor treatment for MRSA infections. Performed at [facility], [address] [place], [place] [address]   CULTURE, BLOOD (ROUTINE X 2) w Reflex to ID Panel     Status: None   Collection Time: 05/13/18  3:18 PM  Result Value Ref Range  Status   Specimen Description BLOOD BFOA  Final   Special Requests   Final    BOTTLES DRAWN AEROBIC AND ANAEROBIC Blood Culture adequate volume   Culture   Final    NO GROWTH 5 DAYS Performed at [facility], [address] [place], [place] [address]    Report Status 05/18/2018 FINAL  Final  CULTURE, BLOOD (ROUTINE X 2) w Reflex to ID Panel     Status: None   Collection Time: 05/13/18  4:33 PM  Result Value Ref Range Status   Specimen Description BLOOD L HAND  Final   Special Requests   Final    BOTTLES DRAWN AEROBIC AND ANAEROBIC Blood Culture adequate volume   Culture   Final    NO GROWTH 5 DAYS Performed at [facility], [address], [place], [place] [address]    Report Status 05/18/2018 FINAL  Final    Coagulation Studies: No results for input(s): LABPROT, INR in the last 72  hours.  Urinalysis: No results for input(s): COLORURINE, LABSPEC, PHURINE, GLUCOSEU, HGBUR, BILIRUBINUR, KETONESUR, PROTEINUR, UROBILINOGEN, NITRITE, LEUKOCYTESUR in the last 72 hours.  Invalid input(s): APPERANCEUR    Imaging: No results found.   Medications:   . anticoagulant sodium citrate     . aspirin  81 mg Per Tube Daily  . atorvastatin  40 mg Oral q1800  . Chlorhexidine Gluconate Cloth  6 each Topical Q0600  . clopidogrel  75 mg Oral Daily  . diltiazem  120 mg Oral Daily  . docusate  100 mg Per Tube BID  . famotidine  20 mg Per Tube QHS  . feeding supplement (NEPRO CARB STEADY)  237 mL Oral BID BM  . insulin aspart  0-9 Units Subcutaneous TID WC  . multivitamin  1 tablet Oral QHS  . multivitamin with minerals  1 tablet Oral Daily  . sodium chloride flush  10-40 mL Intracatheter Q12H  . torsemide  40 mg Oral Daily   acetaminophen **OR** acetaminophen, hydrALAZINE, ipratropium-albuterol, oxyCODONE-acetaminophen, sodium chloride flush, sorbitol  Assessment/ Plan:  Tracy Jennings is a 62 y.o. black female with dialysis mellitus type II insulin dependent, hypertension, hyperlipidemia, peripheral vascular disease, chronic pancreatitis, anemia of chronic kidney disease admitted on 05/11/2018 for AVF creation by Dr. Delana Meyer. Post operative complication of cardiac arrest  1. 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 With history of nephrotic range proteinuria.  Baseline creatinine of 4.68, GFR of 11.  Chronic kidney disease secondary to diabetic nephropathy. Discussed various modalities of RRT with patient including HD and PD. Patient chose HD at present. Transplant referral as outpatient -We will use the patient's right internal jugular PermCath today for dialysis.  Outpatient dialysis center placement pending.  2.  Cardiac arrest: placed on hypothermic protocol.  - Appreciate cardiology input  3. Acute  resp failure with volume overload -Plan to continue ultrafiltration with dialysis today.  Ultrafiltration target 1.5 kg.  4. Anemia of chronic kidney disease: normocytic.  IMA globin currently 8.5.  We will order Epogen 10,000 units IV with dialysis.  5. Diabetes mellitus type II with chronic kidney disease: insulin dependent. History of poor control. Hemoglobin A1c 6.6% on 02/26/18.   6. Hypernatremia Resolved, most recent serum sodium was 143.   LOS: 12 Atsushi Yom 9/11/201910:22 AM

## 2018-05-23 NOTE — Progress Notes (Signed)
Pt tolerated sitting on chair for 3.5 hrs. No complaints.

## 2018-05-23 NOTE — Progress Notes (Signed)
HD Treatment Complete    05/23/18 1415  Vital Signs  Pulse Rate 96  Pulse Rate Source Monitor  Resp 15  BP (!) 192/73  BP Location Right Arm  BP Method Automatic  Patient Position (if appropriate) Lying  Oxygen Therapy  SpO2 100 %  O2 Device Room Air  During Hemodialysis Assessment  Blood Flow Rate (mL/min) 400 mL/min  Arterial Pressure (mmHg) -150 mmHg  Venous Pressure (mmHg) 130 mmHg  Transmembrane Pressure (mmHg) 50 mmHg  Ultrafiltration Rate (mL/min) 430 mL/min  Dialysate Flow Rate (mL/min) 800 ml/min  Conductivity: Machine  13.6  HD Safety Checks Performed Yes  Intra-Hemodialysis Comments Progressing as prescribed;Tolerated well;Tx completed (UF 1521)

## 2018-05-23 NOTE — Progress Notes (Signed)
[place] [facility] [place] NAME: Tracy Jennings    MR#:  [id number]  DATE OF BIRTH:  Dec 24, 1955  SUBJECTIVE:  Patient seen in dialysis She is not sitting up She voices no complaints this morning. REVIEW OF SYSTEMS:    Review of Systems  Constitutional: Negative for fever, chills weight loss HENT: Negative for ear pain, nosebleeds, congestion, facial swelling, rhinorrhea, neck pain, neck stiffness and ear discharge.   Respiratory: Negative for cough, shortness of breath, wheezing  Cardiovascular: Negative for chest pain, palpitations and leg swelling.  Gastrointestinal: Negative for heartburn, abdominal pain, vomiting, diarrhea or consitpation Genitourinary: Negative for dysuria, urgency, frequency, hematuria Musculoskeletal: Negative for back pain or joint pain Neurological: Negative for dizziness, seizures, syncope, focal weakness,  numbness and headaches.  Hematological: Does not bruise/bleed easily.  Psychiatric/Behavioral: Negative for hallucinations, confusion, dysphoric mood    Tolerating Diet: yes      DRUG ALLERGIES:   Allergies  Allergen Reactions  . Hctz [Hydrochlorothiazide]     pancreatitis  . Amlodipine Swelling    Knees down to ankles  . Amlodipine Besylate Swelling  . Sulfa Antibiotics Rash  . Heparin   . Dairy Aid [Lactase]     Runny nose    VITALS:  Blood pressure (!) 174/75, pulse 93, temperature 99.3 F (37.4 C), temperature source Oral, resp. rate 20, height 5' 3.5" (1.613 m), weight 78.3 kg, SpO2 99 %.  PHYSICAL EXAMINATION:  Constitutional: Appears well-developed and well-nourished. No distress. HENT: Normocephalic. Marland Kitchen Oropharynx is clear and moist.  Eyes: Conjunctivae and EOM are normal. PERRLA, no scleral icterus.  Neck: Normal ROM. Neck supple. No JVD. No tracheal deviation. CVS: RRR, S1/S2 +, no murmurs, no gallops, no carotid bruit.  Pulmonary: Effort and breath sounds normal, no stridor, rhonchi,  wheezes, rales.  Abdominal: Soft. BS +,  no distension, tenderness, rebound or guarding.  Musculoskeletal: Normal range of motion. No edema and no tenderness.  Neuro: Alert. CN 2-12 grossly intact. No focal deficits. Skin: Skin is warm and dry. No rash noted. Psychiatric: Normal mood and affect.      LABORATORY PANEL:   CBC Recent Labs  Lab 05/21/18 0445  WBC 9.2  HGB 8.5*  HCT 24.7*  PLT 175   ------------------------------------------------------------------------------------------------------------------  Chemistries  Recent Labs  Lab 05/21/18 0445  NA 143  K 3.0*  CL 102  CO2 32  GLUCOSE 113*  BUN 29*  CREATININE 3.81*  CALCIUM 8.0*   ------------------------------------------------------------------------------------------------------------------  Cardiac Enzymes No results for input(s): TROPONINI in the last 168 hours. ------------------------------------------------------------------------------------------------------------------  RADIOLOGY:  No results found.   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  1.  Acute metabolic encephalopathy due to hypoxic/anoxic brain injury and uremia: Patient's mental status is much improved and appears at baseline. Patient was evaluated by neurology  2.  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 Outpatient hemodialysis is confirmed and she is to start on Thursday   3.  Essential hypertension: Continue Cardizem  4.  Anemia of chronic kidney disease: Continue Epogen as recommended by nephrology  5.  Pneumonia while in the ICU: This has been treated with cefepime.  6.  Peripheral vascular disease: Continue Plavix  and statin   Awaiting insurance approval for  CIR Management plans discussed with the patient and she is in agreement.  CODE STATUS: Full  TOTAL TIME TAKING CARE OF THIS PATIENT:22 minutes.     POSSIBLE D/C hopefully soon DEPENDING ON CLINICAL CONDITION.   Dhalia Zingaro M.D on 05/23/2018 at 12:27 PM  Between 7am to 6pm - Pager - [phone] After 6pm go to [email or web address] - password [email or web address] [facility]  Office  [phone]  CC: Primary care physician; No primary care provider on file.  Note: This dictation was prepared with Dragon dictation along with smaller phrase technology. Any transcriptional errors that result from this process are unintentional.

## 2018-05-23 NOTE — Progress Notes (Signed)
HD femoral catheter removed. No complications. Attempted to change dressing on RIJ permcath, but dry blood noted around sutures. Cleaned around the area, so as not to dislodge sutures and cath. Hardened,bulky dressing consisted of layers of 4x4s noted and removed. Replaced with new 4x4s, stabilized with tegaderm.

## 2018-05-23 NOTE — Progress Notes (Signed)
PT Cancellation Note  Patient Details Name: SAFA DERNER MRN: [id number] DOB: 1956/09/07   Cancelled Treatment:    Reason Eval/Treat Not Completed: Patient at procedure or test/unavailable; pt at hemodialysis. Re attempt at a later time/date, as the schedule allows.    Larae Grooms, PTA 05/23/2018, 12:10 PM

## 2018-05-23 NOTE — Progress Notes (Signed)
HD Treatment Initiated    05/23/18 1030  Vital Signs  Pulse Rate (!) 101  Pulse Rate Source Monitor  Resp (!) 23 (pt coughing)  BP (!) 198/79  BP Location Right Arm  BP Method Automatic  Patient Position (if appropriate) Lying  Oxygen Therapy  SpO2 99 %  O2 Device Room Air  During Hemodialysis Assessment  Blood Flow Rate (mL/min) 400 mL/min  Arterial Pressure (mmHg) -130 mmHg  Venous Pressure (mmHg) 150 mmHg  Transmembrane Pressure (mmHg) 60 mmHg  Ultrafiltration Rate (mL/min) 430 mL/min  Dialysate Flow Rate (mL/min) 800 ml/min  Conductivity: Machine  13.6  HD Safety Checks Performed Yes  Dialysis Fluid Bolus Normal Saline  Bolus Amount (mL) 250 mL  Intra-Hemodialysis Comments Tx initiated  Hemodialysis Catheter Right Internal jugular   Placement Date/Time: 05/21/18 1700   Placed prior to admission: (c)   Person Inserting Catheter: Dr Leotis Pain  Orientation: Right  Access Location: Internal jugular  Hemodialysis Catheter Type: (c)   Blue Lumen Status Infusing  Red Lumen Status Infusing

## 2018-05-23 NOTE — Progress Notes (Signed)
SLP Cancellation Note  Patient Details Name: DARNICE COMRIE MRN: [id number] DOB: [birth year]   Cancelled treatment:       Reason Eval/Treat Not Completed: Patient at procedure or test/unavailable(chart reviewed; pt currently in HD. Will f/u tomorrow. ).   Orinda Kenner, MS, CCC-SLP Aviyah Swetz 05/23/2018, 2:18 PM

## 2018-05-23 NOTE — Progress Notes (Signed)
Post HD Assessment    05/23/18 1420  Neurological  Level of Consciousness Alert  Orientation Level Oriented X4  Respiratory  Respiratory Pattern Regular;Unlabored;Symmetrical  Chest Assessment Chest expansion symmetrical  Bilateral Breath Sounds Clear;Diminished  Cough Non-productive  Cardiac  Pulse Regular  Heart Sounds S1, S2  Jugular Venous Distention (JVD) No  ECG Monitor Yes  Cardiac Rhythm NSR  Vascular  R Radial Pulse +2  L Radial Pulse +2  R Dorsalis Pedis Pulse +2  L Dorsalis Pedis Pulse +2  Edema Generalized  Integumentary  Integumentary (WDL) X  Skin Color Appropriate for ethnicity  Skin Condition Dry  Skin Integrity Blister  Musculoskeletal  Musculoskeletal (WDL) X  Generalized Weakness Yes  GU Assessment  Genitourinary (WDL) X (HD pt)  Psychosocial  Psychosocial (WDL) WDL

## 2018-05-23 NOTE — Progress Notes (Signed)
OT Cancellation Note  Patient Details Name: Tracy Jennings MRN: [id number] DOB: 29-Nov-1955   Cancelled Treatment:    Reason Eval/Treat Not Completed: Other (comment). Pt recently back from HD. Still has temporary femoral HD catheter. Will hold OT tx this date and re-attempt once femoral catheter has been removed and pt is able to participate more fully in functional ADL and mobility tasks.    [name], MPH, MS, OTR/L ascom [phone] 05/23/18, 3:51 PM

## 2018-05-23 NOTE — Progress Notes (Signed)
Pre HD Assessment    05/23/18 1020  Neurological  Level of Consciousness Alert  Orientation Level Oriented X4  Respiratory  Respiratory Pattern Regular;Unlabored;Symmetrical  Chest Assessment Chest expansion symmetrical  Bilateral Breath Sounds Clear;Diminished  Cough Non-productive  Cardiac  Pulse Regular  Heart Sounds S1, S2  Jugular Venous Distention (JVD) No  ECG Monitor Yes  Cardiac Rhythm NSR  Vascular  R Radial Pulse +2  L Radial Pulse +2  R Dorsalis Pedis Pulse +2  L Dorsalis Pedis Pulse +2  Edema Generalized  Integumentary  Integumentary (WDL) X  Skin Color Appropriate for ethnicity  Skin Condition Dry  Skin Integrity Blister  Musculoskeletal  Musculoskeletal (WDL) X  Generalized Weakness Yes  GU Assessment  Genitourinary (WDL) X (HD pt)  Psychosocial  Psychosocial (WDL) WDL

## 2018-05-23 NOTE — NC FL2 (Signed)
[place] LEVEL OF CARE SCREENING TOOL     IDENTIFICATION  Patient Name: Tracy Jennings Birthdate: 1956-04-27 Sex: female Admission Date (Current Location): 05/11/2018  [place] and [place] Number:  Engineering geologist and Address:  [facility], [address] [place],  [address]      Provider Number: [id number]  Attending Physician Name and Address:  Bettey Costa, MD  Relative Name and Phone Number:       Current Level of Care: Hospital Recommended Level of Care: [place] Prior Approval Number:    Date Approved/Denied:   PASRR Number: [id number])  Discharge Plan: SNF    Current Diagnoses: Patient Active Problem List   Diagnosis Date Noted  . Complication of vascular access for dialysis 05/11/2018  . Respiratory arrest ([place]) 05/11/2018  . Hyperlipidemia 04/12/2018  . Pancreatitis, recurrent 11/01/2017  . Diabetes ([place]) 11/01/2017  . HTN (hypertension) 11/01/2017  . CKD (chronic kidney disease), stage IV ([place]) 11/01/2017  . Recurrent pancreatitis 11/01/2017    Orientation RESPIRATION BLADDER Height & Weight     Self, Time, Situation, Place  Normal Continent Weight: 172 lb 9.9 oz (78.3 kg) Height:  5' 3.5" (161.3 cm)  BEHAVIORAL SYMPTOMS/MOOD NEUROLOGICAL BOWEL NUTRITION STATUS      Continent Diet(Diet: Regular )  AMBULATORY STATUS COMMUNICATION OF NEEDS Skin   Extensive Assist Verbally Normal                       Personal Care Assistance Level of Assistance  Bathing, Feeding, Dressing Bathing Assistance: Limited assistance Feeding assistance: Independent Dressing Assistance: Limited assistance     Functional Limitations Info  Sight, Hearing, Speech Sight Info: Adequate Hearing Info: Adequate Speech Info: Adequate    SPECIAL CARE FACTORS FREQUENCY  PT (By licensed PT), OT (By licensed OT)(Dialysis: [facility] TTS 11:40 am )     PT Frequency: (5) OT Frequency: (5)             Contractures      Additional Factors Info  Code Status, Allergies Code Status Info: (Full Code. ) Allergies Info: (Hctz Hydrochlorothiazide, Amlodipine, Amlodipine Besylate, Sulfa Antibiotics, Heparin, Dairy Aid Lactase)           Current Medications (05/23/2018):  This is the current hospital active medication list Current Facility-Administered Medications  Medication Dose Route Frequency Provider Last Rate Last Dose  . acetaminophen (TYLENOL) tablet 650 mg  650 mg Oral Q6H PRN Algernon Huxley, MD   650 mg at 05/17/18 1818   Or  . acetaminophen (TYLENOL) suppository 650 mg  650 mg Rectal Q6H PRN Algernon Huxley, MD      . anticoagulant sodium citrate solution 5 mL  5 mL Intravenous Once per day on Mon Wed Fri Dew, Jason S, MD   5 mL at 05/21/18 1310  . aspirin chewable tablet 81 mg  81 mg Per Tube Daily Algernon Huxley, MD   81 mg at 05/23/18 0829  . atorvastatin (LIPITOR) tablet 40 mg  40 mg Oral q1800 Algernon Huxley, MD   40 mg at 05/22/18 1717  . Chlorhexidine Gluconate Cloth 2 % PADS 6 each  6 each Topical Q0600 Algernon Huxley, MD   6 each at 05/23/18 0501  . clopidogrel (PLAVIX) tablet 75 mg  75 mg Oral Daily Algernon Huxley, MD   75 mg at 05/23/18 0829  . diltiazem (CARDIZEM LA) 24 hr tablet 120 mg  120 mg  Oral Daily Algernon Huxley, MD   120 mg at 05/22/18 1055  . docusate (COLACE) 50 MG/5ML liquid 100 mg  100 mg Per Tube BID Algernon Huxley, MD   100 mg at 05/23/18 0828  . epoetin alfa (EPOGEN,PROCRIT) injection 10,000 Units  10,000 Units Intravenous Q T,Th,Sa-HD Holley Raring, [place], MD   10,000 Units at 05/23/18 1350  . famotidine (PEPCID) tablet 20 mg  20 mg Per Tube QHS Algernon Huxley, MD   20 mg at 05/22/18 2135  . feeding supplement (NEPRO CARB STEADY) liquid 237 mL  237 mL Oral BID BM Algernon Huxley, MD   237 mL at 05/22/18 1336  . hydrALAZINE (APRESOLINE) injection 10 mg  10 mg Intravenous Q6H PRN Algernon Huxley, MD   10 mg at 05/23/18 0515  . hydrALAZINE (APRESOLINE) tablet 50 mg  50 mg Oral  Q8H Mody, Sital, MD      . insulin aspart (novoLOG) injection 0-9 Units  0-9 Units Subcutaneous TID WC Algernon Huxley, MD   1 Units at 05/23/18 0827  . ipratropium-albuterol (DUONEB) 0.5-2.5 (3) MG/3ML nebulizer solution 3 mL  3 mL Nebulization Q6H PRN Algernon Huxley, MD      . multivitamin (RENA-VIT) tablet 1 tablet  1 tablet Oral QHS Bettey Costa, MD   1 tablet at 05/22/18 2135  . multivitamin with minerals tablet 1 tablet  1 tablet Oral Daily Bettey Costa, MD   1 tablet at 05/22/18 1336  . oxyCODONE-acetaminophen (PERCOCET/ROXICET) 5-325 MG per tablet 1 tablet  1 tablet Oral Q6H PRN Vaughan Basta, MD   1 tablet at 05/22/18 0305  . sodium chloride flush (NS) 0.9 % injection 10-40 mL  10-40 mL Intracatheter Q12H Algernon Huxley, MD   10 mL at 05/22/18 2137  . sodium chloride flush (NS) 0.9 % injection 10-40 mL  10-40 mL Intracatheter PRN Algernon Huxley, MD      . sorbitol 70 % solution 30 mL  30 mL Oral Daily PRN Algernon Huxley, MD      . torsemide (DEMADEX) tablet 40 mg  40 mg Oral Daily Algernon Huxley, MD   40 mg at 05/22/18 1056  . traMADol (ULTRAM) tablet 50 mg  50 mg Oral Q6H PRN Bettey Costa, MD         Discharge Medications: Please see discharge summary for a list of discharge medications.  Relevant Imaging Results:  Relevant Lab Results:   Additional Information (SSN: [id number])  Naod Sweetland, Veronia Beets, LCSW

## 2018-05-23 NOTE — Clinical Social Work Note (Signed)
Clinical Social Work Assessment  Patient Details  Name: Tracy Jennings MRN: [id number] Date of Birth: [birth year]  Date of referral:  05/23/18               Reason for consult:  Facility Placement                Permission sought to share information with:  Chartered certified accountant granted to share information::  Yes, Verbal Permission Granted  Name::      [place]::   [place]   Relationship::     Contact Information:     Housing/Transportation Living arrangements for the past 2 months:  [place] of Information:  Spouse Patient Interpreter Needed:  None Criminal Activity/Legal Involvement Pertinent to Current Situation/Hospitalization:  No - Comment as needed Significant Relationships:  Spouse Lives with:  Spouse Do you feel safe going back to the place where you live?  Yes Need for family participation in patient care:  Yes (Comment)  Care giving concerns:  Patient lives in [place] with her husband Tracy Jennings.    Social Worker assessment / plan:  Holiday representative ([place]) reviewed chart and noted that PT is recommending CIR. Per RN case manager CIR is being pursued however patient will have to sit up for dialysis. CSW attempted to meet with patient however she was off the floor for dialysis. CSW contacted patient's husband Tracy Jennings to complete assessment. Per husband him and his wife live in [place] and patient will start outpatient dialysis at [facility] on [address] TTS 11:40 am. CSW explained that CIR is recommendation and they will have to get [place] approval. CSW explained that SNF search can be started as a back up plan to CIR. CSW explained SNF process and that Christella Scheuermann would have approve SNF. Husband is agreeable to SNF search in [facility] as a back up plan to CIR. FL2 complete and faxed out. CSW will continue to follow and assist as needed.       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 SNF )  Patient/Family's Response to care:  Patient's husband is agreeable to SNF search as a back up plan to CIR.   Patient/Family's Understanding of and Emotional Response to Diagnosis, Current Treatment, and Prognosis:  Patient's husband was very pleasant and thanked CSW for assistance.   Emotional Assessment Appearance:  Appears stated age Attitude/Demeanor/Rapport:  Unable to Assess Affect (typically observed):  Unable to Assess Orientation:  Oriented to Self, Oriented to Place, Oriented to  Time, Oriented to Situation Alcohol / Substance use:  Not Applicable Psych involvement (Current and /or in the community):  No (Comment)  Discharge Needs  Concerns to be addressed:  Discharge Planning Concerns Readmission within the last 30 days:  No Current discharge risk:  Dependent with Mobility Barriers to Discharge:  Continued Medical Work up   [employer], Veronia Beets, LCSW 05/23/2018, 2:05 PM

## 2018-05-23 NOTE — Clinical Social Work Placement (Signed)
   CLINICAL SOCIAL WORK PLACEMENT  NOTE  Date:  05/23/2018  Patient Details  Name: Tracy Jennings MRN: [id number] Date of Birth: Jan 14, 1956  Clinical Social Work is seeking post-discharge placement for this patient at the [place] level of care (*CSW will initial, date and re-position this form in  chart as items are completed):  Yes   Patient/family provided with [place] Work Department's list of facilities offering this level of care within the geographic area requested by the patient (or if unable, by the patient's family).  Yes   Patient/family informed of their freedom to choose among providers that offer the needed level of care, that participate in Medicare, Medicaid or managed care program needed by the patient, have an available bed and are willing to accept the patient.  Yes   Patient/family informed of [facility]'s ownership interest in [facility] and [facility], as well as of the fact that they are under no obligation to receive care at these facilities.  PASRR submitted to EDS on 05/23/18     PASRR number received on 05/23/18     Existing PASRR number confirmed on       FL2 transmitted to all facilities in geographic area requested by pt/family on 05/23/18     FL2 transmitted to all facilities within larger geographic area on       Patient informed that his/her managed care company has contracts with or will negotiate with certain facilities, including the following:            Patient/family informed of bed offers received.  Patient chooses bed at       Physician recommends and patient chooses bed at      Patient to be transferred to   on  .  Patient to be transferred to facility by       Patient family notified on   of transfer.  Name of family member notified:        PHYSICIAN       Additional Comment:    _______________________________________________ Lorana Maffeo, Veronia Beets, LCSW 05/23/2018, 2:04 PM

## 2018-05-23 NOTE — Progress Notes (Signed)
Post HD Treatment  Pt tolerated treatment well. Her BVP was 80.6. Net UF removed was 1021. All blood was returned back to patient upon ending treatment. Education provided to patient. All questions answered. Notified Mai, Primary RN about dressing change to perm cath and she states she will get the dressing changed. Report called and given.     05/23/18 1430  Vital Signs  Temp 98.9 F (37.2 C)  Temp Source Oral  Pulse Rate 98  Pulse Rate Source Monitor  Resp 15  BP (!) 173/84  BP Location Right Arm  BP Method Automatic  Patient Position (if appropriate) Lying  Oxygen Therapy  SpO2 100 %  O2 Device Room Air  Pain Assessment  Pain Scale 0-10  Pain Score 0  Dialysis Weight  Weight 77.1 kg  Type of Weight Post-Dialysis  Post-Hemodialysis Assessment  Rinseback Volume (mL) 250 mL  KECN 80.6 V  Dialyzer Clearance Lightly streaked  Duration of HD Treatment -hour(s) 3.5 hour(s) (3.5 Hours)  Hemodialysis Intake (mL) 500 mL  UF Total -Machine (mL) 1521 mL  Net UF (mL) 1021 mL  Tolerated HD Treatment Yes  Post-Hemodialysis Comments Tolerated tx well  Hemodialysis Catheter Left Femoral vein Triple-lumen  Placement Date/Time: 05/17/18 0300   Time Out: Correct patient;Correct procedure;Correct site  Maximum sterile barrier precautions: Hand hygiene;Sterile gown;Cap;Sterile gloves;Mask;Large sterile sheet  Site Prep: Chlorhexidine;Skin Prep Completely Dr...  Site Condition No complications  Blue Lumen Status Capped (Central line)  Red Lumen Status Capped (Central line)  Hemodialysis Catheter Right Internal jugular   Placement Date/Time: 05/21/18 1700   Placed prior to admission: (c)   Person Inserting Catheter: Dr Leotis Pain  Orientation: Right  Access Location: Internal jugular  Hemodialysis Catheter Type: (c)   Site Condition No complications  Blue Lumen Status Other (Comment)  Red Lumen Status Other (Comment)  Purple Lumen Status N/A  Catheter fill solution 4% Sodium Citrate   Dressing Type Other (Comment) (original surgical dressing, primary RN to change)  Dressing Status Clean;Dry;Intact  Post treatment catheter status Capped and Clamped

## 2018-05-23 NOTE — Progress Notes (Signed)
Inpatient Rehabilitation Admissions Coordinator  I await therapy assessment after pt has femoral dialysis catheter removed. Noted by PTA yesterday that patient reported that she has gotten to chair and to the bathroom with the help of her spouse prior to femoral catheter removal. I have not initiated insurance approval yet as I await out of bed assessment with therapy.  Danne Baxter, RN, MSN Rehab Admissions Coordinator [phone] 05/23/2018 2:26 PM

## 2018-05-24 LAB — GLUCOSE, CAPILLARY
Glucose-Capillary: 132 mg/dL — ABNORMAL HIGH (ref 70–99)
Glucose-Capillary: 108 mg/dL — ABNORMAL HIGH (ref 70–99)
Glucose-Capillary: 195 mg/dL — ABNORMAL HIGH (ref 70–99)
Glucose-Capillary: 149 mg/dL — ABNORMAL HIGH (ref 70–99)

## 2018-05-24 LAB — BASIC METABOLIC PANEL
Anion gap: 5 (ref 5–15)
BUN: 12 mg/dL (ref 6–20)
CO2: 35 mmol/L — ABNORMAL HIGH (ref 22–32)
Calcium: 7.7 mg/dL — ABNORMAL LOW (ref 8.9–10.3)
Chloride: 100 mmol/L (ref 98–111)
Creatinine, Ser: 3.16 mg/dL — ABNORMAL HIGH (ref 0.44–1.00)
GFR calc Af Amer: 17 mL/min — ABNORMAL LOW (ref >60)
GFR calc non Af Amer: 15 mL/min — ABNORMAL LOW (ref >60)
Glucose, Bld: 128 mg/dL — ABNORMAL HIGH (ref 70–99)
Potassium: 2.9 mmol/L — ABNORMAL LOW (ref 3.5–5.1)
Sodium: 140 mmol/L (ref 135–145)

## 2018-05-24 LAB — PHOSPHORUS: Phosphorus: 2.5 mg/dL (ref 2.5–4.6)

## 2018-05-24 LAB — SEROTONIN RELEASE ASSAY (SRA)
SRA .2 IU/mL UFH Ser-aCnc: 1 % (ref 0–20)
SRA 100IU/mL UFH Ser-aCnc: 1 % (ref 0–20)

## 2018-05-24 MED ORDER — POTASSIUM CHLORIDE CRYS ER 20 MEQ PO TBCR
40.0000 meq | EXTENDED_RELEASE_TABLET | Freq: Once | ORAL | Status: AC
  Administered 2018-05-24: 40 meq via ORAL
  Filled 2018-05-24: qty 2

## 2018-05-24 MED ORDER — SODIUM CHLORIDE 0.9% FLUSH
3.0000 mL | Freq: Two times a day (BID) | INTRAVENOUS | Status: DC
  Administered 2018-05-24 (×2): 3 mL via INTRAVENOUS

## 2018-05-24 MED ORDER — LOSARTAN POTASSIUM 25 MG PO TABS
25.0000 mg | ORAL_TABLET | Freq: Every day | ORAL | Status: DC
  Administered 2018-05-24 – 2018-05-25 (×2): 25 mg via ORAL
  Filled 2018-05-24 (×3): qty 1

## 2018-05-24 NOTE — Progress Notes (Signed)
HD tx end    05/24/18 1207  Vital Signs  Pulse Rate 91  Pulse Rate Source Monitor  Resp 13  BP (!) 183/70  BP Location Right Arm  BP Method Automatic  Patient Position (if appropriate) Sitting  Oxygen Therapy  SpO2 100 %  O2 Device Room Air  During Hemodialysis Assessment  Dialysis Fluid Bolus Normal Saline  Bolus Amount (mL) 250 mL  Intra-Hemodialysis Comments Tx completed

## 2018-05-24 NOTE — Progress Notes (Signed)
SLP Cancellation Note  Patient Details Name: Tracy Jennings MRN: [id number] DOB: 04/26/1956   Cancelled treatment:       Reason Eval/Treat Not Completed: Patient at procedure or test/unavailable. Pt is currently at HD. Will reattempt as schedule allows. Pt would best be served by Cognitive-linguistic therapy when she has discharged to a more structured schedule and routine in her day.    Orinda Kenner, MS, CCC-SLP [name],[name] 05/24/2018, 11:41 AM

## 2018-05-24 NOTE — Progress Notes (Signed)
[facility]  ROUNDING NOTE   Subjective:  Patient seen and evaluated during hemodialysis. Tolerating well. Blood flow rate currently 400.   Objective:  Vital signs in last 24 hours:  Temp:  [98.8 F (37.1 C)-99.3 F (37.4 C)] 98.9 F (37.2 C) (09/12 0825) Pulse Rate:  [89-101] 89 (09/12 0945) Resp:  [10-23] 13 (09/12 0945) BP: (141-198)/(66-92) 165/80 (09/12 0945) SpO2:  [97 %-100 %] 100 % (09/12 0945) Weight:  [76.3 kg-78.3 kg] 76.3 kg (09/12 0825)  Weight change: -1.2 kg Filed Weights   05/23/18 1430 05/24/18 0554 05/24/18 0825  Weight: 77.1 kg 76.3 kg 76.3 kg    Intake/Output: I/O last 3 completed shifts: In: 390 [P.O.:390] Out: 3071 [Urine:2050; Other:1021]   Intake/Output this shift:  No intake/output data recorded.  Physical Exam: General: No acute distress, laying in the bed  Head: Moist oral mucus membranes  Eyes: Anicteric   Neck: Supple  Lungs:  normal breathing effort, clear bilateral  Heart: S1S2 no rubs  Abdomen:  Soft, nontender  Extremities: + dependent peripheral edema.  Neurologic: Alert and oriented x 3  Skin: No lesions  Access: Left AVF, R IJ PC    Basic Metabolic Panel: Recent Labs  Lab 05/17/18 1144  05/19/18 0748 05/20/18 0729 05/21/18 0445 05/23/18 1050 05/24/18 0322  NA  --   --  143 143 143  --  140  K  --   --  3.5 3.3* 3.0*  --  2.9*  CL  --   --  105 103 102  --  100  CO2  --   --  28 29 32  --  35*  GLUCOSE  --   --  98 90 113*  --  128*  BUN  --   --  32* 20 29*  --  12  CREATININE  --   --  3.55* 2.94* 3.81*  --  3.16*  CALCIUM  --    < > 7.5* 7.7* 8.0*  --  7.7*  PHOS 7.3*  --   --   --   --  3.4 2.5   < > = values in this interval not displayed.    Liver Function Tests: No results for input(s): AST, ALT, ALKPHOS, BILITOT, PROT, ALBUMIN in the last 168 hours. No results for input(s): LIPASE, AMYLASE in the last 168 hours. No results for input(s): AMMONIA in the last 168 hours.  CBC: Recent Labs   Lab 05/19/18 0748 05/21/18 0445  WBC 9.9 9.2  HGB 8.8* 8.5*  HCT 25.5* 24.7*  MCV 88.6 88.8  PLT 186 175    Cardiac Enzymes: No results for input(s): CKTOTAL, CKMB, CKMBINDEX, TROPONINI in the last 168 hours.  BNP: Invalid input(s): POCBNP  CBG: Recent Labs  Lab 05/22/18 2108 05/23/18 0719 05/23/18 1706 05/23/18 2118 05/24/18 0747  GLUCAP 128* 128* 247* 130* 132*    Microbiology: Results for orders placed or performed during the hospital encounter of 05/11/18  MRSA PCR Screening     Status: None   Collection Time: 05/11/18  7:56 PM  Result Value Ref Range Status   MRSA by PCR NEGATIVE NEGATIVE Final    Comment:        The GeneXpert MRSA Assay (FDA approved for NASAL specimens only), is one component of a comprehensive MRSA colonization surveillance program. It is not intended to diagnose MRSA infection nor to guide or monitor treatment for MRSA infections. Performed at [facility], [address], [place], [place] [address]  CULTURE, BLOOD (ROUTINE X 2) w Reflex to ID Panel     Status: None   Collection Time: 05/13/18  3:18 PM  Result Value Ref Range Status   Specimen Description BLOOD BFOA  Final   Special Requests   Final    BOTTLES DRAWN AEROBIC AND ANAEROBIC Blood Culture adequate volume   Culture   Final    NO GROWTH 5 DAYS Performed at [facility], [address] [place], [place] [address]    Report Status 05/18/2018 FINAL  Final  CULTURE, BLOOD (ROUTINE X 2) w Reflex to ID Panel     Status: None   Collection Time: 05/13/18  4:33 PM  Result Value Ref Range Status   Specimen Description BLOOD L HAND  Final   Special Requests   Final    BOTTLES DRAWN AEROBIC AND ANAEROBIC Blood Culture adequate volume   Culture   Final    NO GROWTH 5 DAYS Performed at [facility], [address] [place], [place] [address]    Report Status 05/18/2018 FINAL  Final    Coagulation Studies: No results for input(s): LABPROT, INR  in the last 72 hours.  Urinalysis: No results for input(s): COLORURINE, LABSPEC, PHURINE, GLUCOSEU, HGBUR, BILIRUBINUR, KETONESUR, PROTEINUR, UROBILINOGEN, NITRITE, LEUKOCYTESUR in the last 72 hours.  Invalid input(s): APPERANCEUR    Imaging: No results found.   Medications:   . anticoagulant sodium citrate     . aspirin  81 mg Per Tube Daily  . atorvastatin  40 mg Oral q1800  . Chlorhexidine Gluconate Cloth  6 each Topical Q0600  . clopidogrel  75 mg Oral Daily  . diltiazem  120 mg Oral Daily  . docusate  100 mg Per Tube BID  . epoetin (EPOGEN/PROCRIT) injection  10,000 Units Intravenous Q T,Th,Sa-HD  . famotidine  20 mg Per Tube QHS  . feeding supplement (NEPRO CARB STEADY)  237 mL Oral BID BM  . hydrALAZINE  50 mg Oral Q8H  . insulin aspart  0-9 Units Subcutaneous TID WC  . multivitamin  1 tablet Oral QHS  . multivitamin with minerals  1 tablet Oral Daily  . potassium chloride  40 mEq Oral Once  . sodium chloride flush  10-40 mL Intracatheter Q12H  . torsemide  40 mg Oral Daily   acetaminophen **OR** acetaminophen, hydrALAZINE, ipratropium-albuterol, oxyCODONE-acetaminophen, sodium chloride flush, sorbitol, traMADol  Assessment/ Plan:  Tracy Jennings is a 62 y.o. black female with dialysis mellitus type II insulin dependent, hypertension, hyperlipidemia, peripheral vascular disease, chronic pancreatitis, anemia of chronic kidney disease admitted on 05/11/2018 for AVF creation by Dr. Delana Meyer. Post operative complication of cardiac arrest  1. 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 With history of nephrotic range proteinuria.  Baseline creatinine of 4.68, GFR of 11.  Chronic kidney disease secondary to diabetic nephropathy. Discussed various modalities of RRT with patient including HD and PD. Patient chose HD at present. Transplant referral as outpatient -Left femoral dialysis catheter removed.  Right  internal jugular PermCath being used today.  Patient seen during dialysis.  Blood flow rate 400.  2.  Cardiac arrest: placed on hypothermic protocol.  - Appreciate cardiology input  3. Acute resp failure with volume overload -Continue to perform ultrafiltration with dialysis to avoid volume overload.  4. Anemia of chronic kidney disease: normocytic.  Continue Epogen 10,000 units IV with dialysis.  5. Diabetes mellitus type II with chronic kidney disease: insulin dependent. History  of poor control. Hemoglobin A1c 6.6% on 02/26/18.   6. Hypernatremia Resolved, serum sodium was 140 today.   LOS: 13 Chasitty Hehl 9/12/20199:50 AM

## 2018-05-24 NOTE — Progress Notes (Signed)
PT is recommending outpatient PT. RN case manager aware of above. Please reconsult if future social work needs arise. CSW signing off.   [employer], LCSW [phone]

## 2018-05-24 NOTE — Progress Notes (Signed)
Post HD assessment. Pt tolerated tx well without c/o or complication. Net UF 1013, goal met. Pt in chair for HD tx.    05/24/18 1223  Vital Signs  Temp 98.8 F (37.1 C)  Temp Source Oral  Pulse Rate 95  Pulse Rate Source Monitor  Resp 12  BP (!) 161/72  BP Location Right Arm  BP Method Automatic  Patient Position (if appropriate) Sitting  Oxygen Therapy  SpO2 100 %  O2 Device Room Air  Dialysis Weight  Weight 75.7 kg  Type of Weight Post-Dialysis  Post-Hemodialysis Assessment  Rinseback Volume (mL) 250 mL  KECN 77.3 V  Dialyzer Clearance Lightly streaked  Duration of HD Treatment -hour(s) 3.5 hour(s)  Hemodialysis Intake (mL) 500 mL  UF Total -Machine (mL) 1513 mL  Net UF (mL) 1013 mL  Tolerated HD Treatment Yes  Education / Care Plan  Dialysis Education Provided Yes  Documented Education in Care Plan Yes  Hemodialysis Catheter Right Internal jugular   Placement Date/Time: 05/21/18 1700   Placed prior to admission: (c)   Person Inserting Catheter: Dr Leotis Pain  Orientation: Right  Access Location: Internal jugular  Hemodialysis Catheter Type: (c)   Site Condition No complications  Blue Lumen Status Other (Comment) (sodium citrate locked )  Red Lumen Status Other (Comment) (sodium citrate locked )  Purple Lumen Status N/A  Catheter fill solution 4% Sodium Citrate  Catheter fill volume (Arterial) 1.5 cc  Catheter fill volume (Venous) 1.5  Dressing Type Biopatch  Dressing Status Clean;Dry;Intact;Dressing changed  Interventions New dressing  Drainage Description None  Dressing Change Due 05/31/18  Post treatment catheter status Capped and Clamped

## 2018-05-24 NOTE — Progress Notes (Signed)
Occupational Therapy Treatment Patient Details Name: Tracy Jennings MRN: [id number] DOB: [birth year] Today's Date: 05/24/2018    History of present illness Pt admitted for complication for dialysis access. Pt initially admitted on 8/30 for placement for AV fistula and then had complication for thrombosis post op, went back for thrombectomy and had code blue requiring CPR requiring intubation. Pt extubated on 9/3. HIstory includes CKD, DM, ESRD, HTN, PVD, and pancreatitis. Now with R IJ perm cath as of 05/21/18. HD T/T/S.    OT comments  Pt seen for OT tx after cleared by nursing to participate. Pt up in recliner after PT session. Pt denies pain, except mild discomfort at the R IJ perm cath incision site. Pt progressing very well towards goals. Pt significantly improved, with cognition WFL and supervision to CGA required for transitional movements during LB ADL tasks. Pt educated in energy conservation strategies to maximize safety and independence with return to PLOF at home. Handout provided. Pt verbalized understanding. Pt continues to benefit from OT while in the hospital. Discharge recommendations updated. Pt no longer in need of CIR-level of rehab. In fact, not recommending further OT after discharge from hospital. Will continue to progress while admitted.    Follow Up Recommendations  No OT follow up    Equipment Recommendations  None recommended by OT    Recommendations for Other Services      Precautions / Restrictions Precautions Precautions: Fall Restrictions Weight Bearing Restrictions: No       Mobility Bed Mobility               General bed mobility comments: deferred, pt up in recliner  Transfers Overall transfer level: Needs assistance Equipment used: Rolling walker (2 wheeled) Transfers: Sit to/from Stand Sit to Stand: Supervision;Min guard              Balance Overall balance assessment: Needs assistance Sitting-balance support: Feet unsupported;No  upper extremity supported Sitting balance-Leahy Scale: Good     Standing balance support: Bilateral upper extremity supported Standing balance-Leahy Scale: Good                             ADL either performed or assessed with clinical judgement   ADL Overall ADL's : Needs assistance/impaired Eating/Feeding: Independent   Grooming: Independent   Upper Body Bathing: Sitting;Modified independent Upper Body Bathing Details (indicate cue type and reason): pt educated in compensatory strategies in order to perform without getting R IJ permcath wet Lower Body Bathing: Sit to/from stand;Min guard Lower Body Bathing Details (indicate cue type and reason): remote supervision for seated LB bathing, CGA for transitional movement Upper Body Dressing : Sitting;Supervision/safety   Lower Body Dressing: Sit to/from stand;Min guard Lower Body Dressing Details (indicate cue type and reason): CGA for transitional movements Toilet Transfer: RW;Min guard;Ambulation;Regular Toilet           Functional mobility during ADLs: Min guard;Rolling walker       Vision Baseline Vision/History: Wears glasses Wears Glasses: Reading only Patient Visual Report: No change from baseline Vision Assessment?: No apparent visual deficits   Perception     Praxis      Cognition Arousal/Alertness: Awake/alert Behavior During Therapy: WFL for tasks assessed/performed Overall Cognitive Status: Within Functional Limits for tasks assessed                                 General Comments:  Pt alert and oriented. Follows all commands. Demonstrates good insight, problem solving, and safety awareness during session when discussing transition back home.        Exercises Other Exercises Other Exercises: Pt educated in energy conservation strategies including pursed lip breathing, activity pacing, work simplification, home/routines modifications, and falls prevention strategies. Handout  provided. Other Exercises: Pt educated in adaptive techniques for gardening/yardwork to support safe participation while minimizing risk of falls, injury, and over exertion   Shoulder Instructions       General Comments      Pertinent Vitals/ Pain       Pain Assessment: No/denies pain  Home Living                                          Prior Functioning/Environment              Frequency  Min 1X/week        Progress Toward Goals  OT Goals(current goals can now be found in the care plan section)  Progress towards OT goals: Progressing toward goals     Plan Discharge plan needs to be updated;Frequency needs to be updated    Co-evaluation                 AM-PAC PT "6 Clicks" Daily Activity     Outcome Measure   Help from another person eating meals?: None Help from another person taking care of personal grooming?: None Help from another person toileting, which includes using toliet, bedpan, or urinal?: A Little Help from another person bathing (including washing, rinsing, drying)?: A Little Help from another person to put on and taking off regular upper body clothing?: None Help from another person to put on and taking off regular lower body clothing?: A Little 6 Click Score: 21    End of Session    OT Visit Diagnosis: Other abnormalities of gait and mobility (R26.89)   Activity Tolerance Patient tolerated treatment well   Patient Left in chair;with call bell/phone within reach;with chair alarm set   Nurse Communication          Time: 5456-2563 OT Time Calculation (min): 17 min  Charges: OT General Charges $OT Visit: 1 Visit OT Treatments $Self Care/Home Management : 8-22 mins   [name], MPH, MS, OTR/L ascom [phone] 05/24/18, 4:06 PM

## 2018-05-24 NOTE — Progress Notes (Signed)
Physical Therapy Treatment Patient Details Name: Tracy Jennings MRN: [id number] DOB: [birth year] Today's Date: 05/24/2018    History of Present Illness Pt admitted for complication for dialysis access. Pt initially admitted on 8/30 for placement for AV fistula and then had complication for thrombosis post op, went back for thrombectomy and had code blue requiring CPR requiring intubation. Pt extubated on 9/3.  L temporary fem dialysis cath placed 05/17/18 and removed 05/23/18. History includes CKD, DM, ESRD, HTN, PVD, and pancreatitis. Now with R IJ perm cath as of 05/21/18. HD T/T/S.     PT Comments    Pt able to ambulate 200 feet with RW CGA (pt feeling unsteady and reaching for objects for stability when trying to walk without UE support but did well with RW use).  Overall tolerated mobility well and demonstrates strong steady transfers.  Mild soreness at R IJ dialysis permcath site noted.  Recommend OP PT upon hospital discharge (CM and nurse notified).    Follow Up Recommendations  Outpatient PT     Equipment Recommendations  Rolling walker with 5" wheels    Recommendations for Other Services       Precautions / Restrictions Precautions Precautions: Fall Precaution Comments: R IJ dialysis permcath Restrictions Weight Bearing Restrictions: No    Mobility  Bed Mobility               General bed mobility comments: Deferred (pt up in recliner beginning/end of session)  Transfers Overall transfer level: Needs assistance Equipment used: Rolling walker (2 wheeled) Transfers: Sit to/from Stand Sit to Stand: Supervision;Min guard         General transfer comment: steady strong stands  Ambulation/Gait Ambulation/Gait assistance: Min guard Gait Distance (Feet): (200 feet RW; 40 feet no AD) Assistive device: Rolling walker (2 wheeled);None   Gait velocity: decreased   General Gait Details: steady step through gait pattern with RW use x200 feet;  attempted ambulation  with no UE support but pt reporting feeling unsteady and intermittently reaching for objects for security (although only CGA required from therapist) and pt demonstrating decreased B step length and decreased cadence   Stairs             Wheelchair Mobility    Modified Rankin (Stroke Patients Only)       Balance Overall balance assessment: Needs assistance Sitting-balance support: No upper extremity supported;Feet supported Sitting balance-Leahy Scale: Normal Sitting balance - Comments: steady sitting reaching outside BOS   Standing balance support: No upper extremity supported Standing balance-Leahy Scale: Good Standing balance comment: steady standing reaching within BOS                            Cognition Arousal/Alertness: Awake/alert Behavior During Therapy: WFL for tasks assessed/performed Overall Cognitive Status: Within Functional Limits for tasks assessed                                 General Comments: A&O x4      Exercises     General Comments   Nursing cleared pt for participation in physical therapy (nurse reports pt's potassium had been addressed).  Pt agreeable to PT session.      Pertinent Vitals/Pain Pain Assessment: 0-10 Pain Score: 2  Pain Location: R IJ permcath site Pain Descriptors / Indicators: Sore Pain Intervention(s): Limited activity within patient's tolerance;Monitored during session;Repositioned  Vitals (HR and O2 on room  air) stable and WFL throughout treatment session.    Home Living                      Prior Function            PT Goals (current goals can now be found in the care plan section) Acute Rehab PT Goals Patient Stated Goal: return home  PT Goal Formulation: With patient Time For Goal Achievement: 05/30/18 Potential to Achieve Goals: Good Progress towards PT goals: Progressing toward goals    Frequency    Min 2X/week      PT Plan Discharge plan needs to be updated     Co-evaluation              AM-PAC PT "6 Clicks" Daily Activity  Outcome Measure  Difficulty turning over in bed (including adjusting bedclothes, sheets and blankets)?: None Difficulty moving from lying on back to sitting on the side of the bed? : None Difficulty sitting down on and standing up from a chair with arms (e.g., wheelchair, bedside commode, etc,.)?: Unable Help needed moving to and from a bed to chair (including a wheelchair)?: A Little Help needed walking in hospital room?: A Little Help needed climbing 3-5 steps with a railing? : A Little 6 Click Score: 18    End of Session Equipment Utilized During Treatment: Gait belt Activity Tolerance: Patient tolerated treatment well Patient left: in chair;with call bell/phone within reach;Other (comment)(Nurse cleared pt to not have chair alarm on) Nurse Communication: Mobility status;Precautions PT Visit Diagnosis: Muscle weakness (generalized) (M62.81);Difficulty in walking, not elsewhere classified (R26.2);Unsteadiness on feet (R26.81)     Time: 8937-3428 PT Time Calculation (min) (ACUTE ONLY): 24 min  Charges:  $Gait Training: 23-37 mins                    Leitha Bleak, PT 05/24/18, 4:22 PM [phone]

## 2018-05-24 NOTE — Progress Notes (Signed)
Post HD assessment    05/24/18 1222  Neurological  Level of Consciousness Alert  Orientation Level Oriented X4  Respiratory  Respiratory Pattern Regular;Unlabored  Chest Assessment Chest expansion symmetrical  Cardiac  Pulse Regular  ECG Monitor Yes  Cardiac Rhythm NSR  Vascular  R Radial Pulse +2  L Radial Pulse +2  Edema Generalized  Integumentary  Integumentary (WDL) X  Skin Color Appropriate for ethnicity  Musculoskeletal  Musculoskeletal (WDL) X  Generalized Weakness Yes  Assistive Device None  GU Assessment  Genitourinary (WDL) X  Genitourinary Symptoms  (HD)  Psychosocial  Psychosocial (WDL) WDL  Patient Behaviors Cooperative;Calm  Needs Expressed Physical  Emotional support given Given to patient

## 2018-05-24 NOTE — Progress Notes (Signed)
Called Dr. Jodell Cipro regarding patient's potassium level of 2.9.  Appropriate orders were placed.  Christene Slates  05/24/2018  6:52 AM

## 2018-05-24 NOTE — Progress Notes (Deleted)
Pre HD assessment   05/24/18 0825  Vital Signs  Temp 98.9 F (37.2 C)  Temp Source Oral  Pulse Rate 89  Pulse Rate Source Monitor  Resp 14  BP (!) 176/70  BP Location Right Arm  BP Method Automatic  Patient Position (if appropriate) Sitting  Oxygen Therapy  SpO2 100 %  O2 Device Room Air  Pain Assessment  Pain Scale 0-10  Pain Score 0  Dialysis Weight  Weight 76.3 kg  Type of Weight Pre-Dialysis  Time-Out for Hemodialysis  What Procedure? HD  Pt Identifiers(min of two) First/Last Name;MRN/Account#  Correct Site? Yes  Correct Side? Yes  Correct Procedure? Yes  Consents Verified? Yes  Rad Studies Available? N/A  Safety Precautions Reviewed? Yes  Engineer, civil (consulting) Number  (7A)  Station Number 4  UF/Alarm Test Passed  Conductivity: Meter 14  Conductivity: Machine  14.1  pH 7.6  Reverse Osmosis main  Normal Saline Lot Number [id number]  Dialyzer Lot Number [id number]  Disposable Set Lot Number [id number]  Machine Temperature 98.6 F (37 C)  Musician and Audible Yes  Blood Lines Intact and Secured Yes  Pre Treatment Patient Checks  Vascular access used during treatment Catheter  Patient is receiving dialysis in a chair Yes  Hepatitis B Surface Antigen Results Negative  Date Hepatitis B Surface Antigen Drawn 05/17/18  Hepatitis B Surface Antibody  (<10)  Date Hepatitis B Surface Antibody Drawn 05/17/18  Hemodialysis Consent Verified Yes  Hemodialysis Standing Orders Initiated Yes  ECG (Telemetry) Monitor On Yes  Prime Ordered Normal Saline  Length of  DialysisTreatment -hour(s) 3.5 Hour(s)  Dialyzer Elisio 17H NR  Dialysis Anticoagulant None  Dialysate Flow Ordered 600  Blood Flow Rate Ordered 400 mL/min  Ultrafiltration Goal 1 Liters  Pre Treatment Labs Phosphorus  Dialysis Blood Pressure Support Ordered Normal Saline  Education / Care Plan  Dialysis Education Provided Yes  Documented Education in Care Plan Yes  Hemodialysis Catheter Right Internal  jugular   Placement Date/Time: 05/21/18 1700   Placed prior to admission: (c)   Person Inserting Catheter: Dr Leotis Pain  Orientation: Right  Access Location: Internal jugular  Hemodialysis Catheter Type: (c)   Site Condition No complications  Dressing Type Gauze/Drain sponge  Dressing Status Old drainage;Dry;Intact  Interventions New dressing  Drainage Description Serous  Dressing Change Due 05/24/18

## 2018-05-24 NOTE — Progress Notes (Addendum)
[place] [facility] [place] NAME: Tracy Jennings    MR#:  [id number]  DATE OF BIRTH:  October 24, 1955  SUBJECTIVE:  Patient seen after dialysis Sitting in the chair tolerating diet well No new complaints REVIEW OF SYSTEMS:    Review of Systems  Constitutional: Negative for fever, chills weight loss HENT: Negative for ear pain, nosebleeds, congestion, facial swelling, rhinorrhea, neck pain, neck stiffness and ear discharge.   Respiratory: Negative for cough, shortness of breath, wheezing  Cardiovascular: Negative for chest pain, palpitations and leg swelling.  Gastrointestinal: Negative for heartburn, abdominal pain, vomiting, diarrhea or consitpation Genitourinary: Negative for dysuria, urgency, frequency, hematuria Musculoskeletal: Negative for back pain or joint pain Neurological: Negative for dizziness, seizures, syncope, focal weakness,  numbness and headaches.  Hematological: Does not bruise/bleed easily.  Psychiatric/Behavioral: Negative for hallucinations, confusion, dysphoric mood   Tolerating Diet: yes   DRUG ALLERGIES:   Allergies  Allergen Reactions  . Hctz [Hydrochlorothiazide]     pancreatitis  . Amlodipine Swelling    Knees down to ankles  . Amlodipine Besylate Swelling  . Sulfa Antibiotics Rash  . Heparin   . Dairy Aid [Lactase]     Runny nose    VITALS:  Blood pressure (!) 178/71, pulse 99, temperature 98.6 F (37 C), temperature source Oral, resp. rate 16, height 5' 3.5" (1.613 m), weight 75.7 kg, SpO2 96 %.  PHYSICAL EXAMINATION:  Constitutional: Appears well-developed and well-nourished. No distress. HENT: Normocephalic. Marland Kitchen Oropharynx is clear and moist.  Eyes: Conjunctivae and EOM are normal. PERRLA, no scleral icterus.  Neck: Normal ROM. Neck supple. No JVD. No tracheal deviation. CVS: RRR, S1/S2 +, no murmurs, no gallops, no carotid bruit.  Pulmonary: Effort and breath sounds normal, no stridor, rhonchi, wheezes,  rales.  Abdominal: Soft. BS +,  no distension, tenderness, rebound or guarding.  Musculoskeletal: Normal range of motion. No edema and no tenderness.  Neuro: Alert. CN 2-12 grossly intact. No focal deficits. Skin: Skin is warm and dry. No rash noted. Psychiatric: Normal mood and affect.      LABORATORY PANEL:   CBC Recent Labs  Lab 05/21/18 0445  WBC 9.2  HGB 8.5*  HCT 24.7*  PLT 175   ------------------------------------------------------------------------------------------------------------------  Chemistries  Recent Labs  Lab 05/24/18 0322  NA 140  K 2.9*  CL 100  CO2 35*  GLUCOSE 128*  BUN 12  CREATININE 3.16*  CALCIUM 7.7*   ------------------------------------------------------------------------------------------------------------------  Cardiac Enzymes No results for input(s): TROPONINI in the last 168 hours. ------------------------------------------------------------------------------------------------------------------  RADIOLOGY:  No results found.   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  1.  Acute metabolic encephalopathy due to hypoxic/anoxic brain injury and uremia improved: Patient's mental status  appears at baseline. Patient was evaluated by neurology during the hospitalization  2.  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 Patient will be scheduled for Tuesday Thursday Saturday dialysis days  3.  Essential hypertension: Continue Cardizem  4.  Anemia of chronic kidney disease: Continue Epogen as recommended by nephrology  5.  Pneumonia while in the ICU: This has been treated with cefepime.  6.  Peripheral vascular disease: Continue  Plavix and statin  7.  Acute hypokalemia Replace potassium orally  8.  Disposition  Home with home health services once physical therapy evaluated.   Awaiting insurance approval for CIR Management plans discussed with the patient and she is in agreement.  CODE STATUS: Full  TOTAL TIME TAKING CARE OF THIS PATIENT:23 minutes.    POSSIBLE D/C hopefully soon DEPENDING ON CLINICAL CONDITION.   Saundra Shelling M.D on 05/24/2018 at 2:43 PM  Between 7am to 6pm - Pager - [phone] After 6pm go to [email or web address] - password [email or web address] [facility]  Office  [phone]  CC: Primary care physician; No primary care provider on file.  Note: This dictation was prepared with Dragon dictation along with smaller phrase technology. Any transcriptional errors that result from this process are unintentional.

## 2018-05-24 NOTE — Progress Notes (Signed)
OT Cancellation Note  Patient Details Name: Tracy Jennings MRN: [id number] DOB: 28-Oct-1955   Cancelled Treatment:    Reason Eval/Treat Not Completed: Patient at procedure or test/ unavailable. Pt out of room for dialysis. Will re-attempt OT tx at later date/time as pt is available and medically appropriate.  [name], MPH, MS, OTR/L ascom [phone] 05/24/18, 9:38 AM

## 2018-05-24 NOTE — Progress Notes (Signed)
Inpatient Rehabilitation Admissions Coordinator  Notified by RN CM, Colletta Maryland, that PT is rec outpt therapy at this time. Assessmnet not avialalbe in Epic at this time. We will sign off.  Danne Baxter, RN, MSN Rehab Admissions Coordinator [phone] 05/24/2018 3:18 PM

## 2018-05-24 NOTE — Progress Notes (Signed)
PT Cancellation Note  Patient Details Name: Tracy Jennings MRN: [id number] DOB: [birth year]   Cancelled Treatment:    Reason Eval/Treat Not Completed: Patient at procedure or test/unavailable   Pt at dialysis during multiple attempts this am.  Will try to reschedule for this pm as schedule allows.   [name] 05/24/2018, 11:34 AM

## 2018-05-24 NOTE — Progress Notes (Signed)
HD tx start    05/24/18 0832  Vital Signs  Pulse Rate 89  Pulse Rate Source Monitor  Resp 16  BP (!) 163/69  BP Location Right Arm  BP Method Automatic  Patient Position (if appropriate) Lying  Oxygen Therapy  SpO2 98 %  O2 Device Room Air  During Hemodialysis Assessment  Blood Flow Rate (mL/min) 400 mL/min  Arterial Pressure (mmHg) -140 mmHg  Venous Pressure (mmHg) 100 mmHg  Transmembrane Pressure (mmHg) 60 mmHg  Ultrafiltration Rate (mL/min) 430 mL/min  Dialysate Flow Rate (mL/min) 600 ml/min  Conductivity: Machine  14.2  HD Safety Checks Performed Yes  Dialysis Fluid Bolus Normal Saline  Bolus Amount (mL) 250 mL  Intra-Hemodialysis Comments Tx initiated  Hemodialysis Catheter Right Internal jugular   Placement Date/Time: 05/21/18 1700   Placed prior to admission: (c)   Person Inserting Catheter: Dr Leotis Pain  Orientation: Right  Access Location: Internal jugular  Hemodialysis Catheter Type: (c)   Blue Lumen Status Infusing  Red Lumen Status Infusing

## 2018-05-24 NOTE — Progress Notes (Signed)
Pre HD assessment    05/24/18 0826  Neurological  Level of Consciousness Alert  Orientation Level Oriented X4  Respiratory  Respiratory Pattern Regular;Unlabored  Chest Assessment Chest expansion symmetrical  Cardiac  Pulse Regular  ECG Monitor Yes  Cardiac Rhythm NSR  Vascular  R Radial Pulse +2  L Radial Pulse +2  Edema Generalized  Integumentary  Integumentary (WDL) X  Skin Color Appropriate for ethnicity  Musculoskeletal  Musculoskeletal (WDL) X  Generalized Weakness Yes  Assistive Device None  GU Assessment  Genitourinary (WDL) X  Genitourinary Symptoms  (HD)  Psychosocial  Psychosocial (WDL) WDL  Patient Behaviors Cooperative;Calm  Needs Expressed Physical  Emotional support given Given to patient

## 2018-05-24 NOTE — Progress Notes (Signed)
Pre HD assessment   05/24/18 0825  Vital Signs  Temp 98.9 F (37.2 C)  Temp Source Oral  Pulse Rate 89  Pulse Rate Source Monitor  Resp 14  BP (!) 176/70  BP Location Right Arm  BP Method Automatic  Patient Position (if appropriate) Sitting  Oxygen Therapy  SpO2 100 %  O2 Device Room Air  Pain Assessment  Pain Scale 0-10  Pain Score 0  Dialysis Weight  Weight 76.3 kg  Type of Weight Pre-Dialysis  Time-Out for Hemodialysis  What Procedure? HD  Pt Identifiers(min of two) First/Last Name;MRN/Account#  Correct Site? Yes  Correct Side? Yes  Correct Procedure? Yes  Consents Verified? Yes  Rad Studies Available? N/A  Safety Precautions Reviewed? Yes  Engineer, civil (consulting) Number  (7A)  Station Number 4  UF/Alarm Test Passed  Conductivity: Meter 14  Conductivity: Machine  14.1  pH 7.6  Reverse Osmosis main  Normal Saline Lot Number [id number]  Dialyzer Lot Number [id number]  Disposable Set Lot Number [id number]  Machine Temperature 98.6 F (37 C)  Musician and Audible Yes  Blood Lines Intact and Secured Yes  Pre Treatment Patient Checks  Vascular access used during treatment Catheter  Patient is receiving dialysis in a chair Yes  Hepatitis B Surface Antigen Results Negative  Date Hepatitis B Surface Antigen Drawn 05/17/18  Hepatitis B Surface Antibody  (<10)  Date Hepatitis B Surface Antibody Drawn 05/17/18  Hemodialysis Consent Verified Yes  Hemodialysis Standing Orders Initiated Yes  ECG (Telemetry) Monitor On Yes  Prime Ordered Normal Saline  Length of  DialysisTreatment -hour(s) 3.5 Hour(s)  Dialyzer Elisio 17H NR  Dialysate 3K, 2.5 Ca  Dialysis Anticoagulant None  Dialysate Flow Ordered 600  Blood Flow Rate Ordered 400 mL/min  Ultrafiltration Goal 1 Liters  Pre Treatment Labs Phosphorus  Dialysis Blood Pressure Support Ordered Normal Saline  Education / Care Plan  Dialysis Education Provided Yes  Documented Education in Care Plan Yes  Hemodialysis  Catheter Right Internal jugular   Placement Date/Time: 05/21/18 1700   Placed prior to admission: (c)   Person Inserting Catheter: Dr Leotis Pain  Orientation: Right  Access Location: Internal jugular  Hemodialysis Catheter Type: (c)   Site Condition No complications  Dressing Type Gauze/Drain sponge  Dressing Status Old drainage;Dry;Intact  Interventions New dressing  Drainage Description Serous  Dressing Change Due 05/24/18

## 2018-05-25 LAB — BASIC METABOLIC PANEL
Anion gap: 5 (ref 5–15)
BUN: 11 mg/dL (ref 6–20)
CO2: 32 mmol/L (ref 22–32)
Calcium: 7.9 mg/dL — ABNORMAL LOW (ref 8.9–10.3)
Chloride: 102 mmol/L (ref 98–111)
Creatinine, Ser: 3.29 mg/dL — ABNORMAL HIGH (ref 0.44–1.00)
GFR calc Af Amer: 16 mL/min — ABNORMAL LOW (ref >60)
GFR calc non Af Amer: 14 mL/min — ABNORMAL LOW (ref >60)
Glucose, Bld: 176 mg/dL — ABNORMAL HIGH (ref 70–99)
Potassium: 3.6 mmol/L (ref 3.5–5.1)
Sodium: 139 mmol/L (ref 135–145)

## 2018-05-25 LAB — PHOSPHORUS: Phosphorus: 1.5 mg/dL — ABNORMAL LOW (ref 2.5–4.6)

## 2018-05-25 LAB — GLUCOSE, CAPILLARY
Glucose-Capillary: 166 mg/dL — ABNORMAL HIGH (ref 70–99)
Glucose-Capillary: 141 mg/dL — ABNORMAL HIGH (ref 70–99)
Glucose-Capillary: 97 mg/dL (ref 70–99)

## 2018-05-25 MED ORDER — CLOPIDOGREL BISULFATE 75 MG PO TABS: 75.0000 mg | ORAL_TABLET | Freq: Every day | ORAL | 0 refills | Status: AC

## 2018-05-25 MED ORDER — LOSARTAN POTASSIUM 25 MG PO TABS: 25.0000 mg | ORAL_TABLET | Freq: Every day | ORAL | 0 refills | Status: DC

## 2018-05-25 MED ORDER — TORSEMIDE 20 MG PO TABS: 40.0000 mg | ORAL_TABLET | Freq: Every day | ORAL | 0 refills | Status: DC

## 2018-05-25 NOTE — Progress Notes (Signed)
Patient discharged to home. Instructions given. IV removed. All questions answered.

## 2018-05-25 NOTE — Discharge Summary (Signed)
[facility] NAME: Tracy Jennings    MR#:  [id number]  DATE OF BIRTH:  June 26, 1956  DATE OF ADMISSION:  05/11/2018 ADMITTING PHYSICIAN: Katha Cabal, MD  DATE OF DISCHARGE: 05/25/2018  PRIMARY CARE PHYSICIAN: Valera Castle, MD   ADMISSION DIAGNOSIS:  CHRONIC KIDNEY DISEASE Acute encephalopathy Uremia CKD stage IV Metabolic acidosis  pneumonia Hypertension Type 2 diabetes mellitus Peripheral vascular disease DISCHARGE DIAGNOSIS:  Active Problems:   Complication of vascular access for dialysis   Respiratory arrest ([place]) Acute metabolic encephalopathy Cardiac arrest Hypernatremia Chronic kidney disease stage IV which progressed to end-stage renal disease ESRD on dialysis Essential hypertension Pneumonia Peripheral vascular disease Hypokalemia  SECONDARY DIAGNOSIS:   Past Medical History:  Diagnosis Date  . Anemia 04/2018   low iron. to be started on supplements  . CKD (chronic kidney disease)    Stage IV  . Diabetes mellitus without complication ([place])    type II  . ESRD (end stage renal disease) ([place])   . Heart murmur    followed as a child only  . Hyperlipidemia associated with type 2 diabetes mellitus ([place])   . Hypertension   . Pancreatitis   . Peripheral vascular disease ([place])      ADMITTING HISTORY Tracy Jennings  is a 62 y.o. female with a known history of chronic kidney disease, peripheral vascular disease, diabetes, anemia and history of pancreatitis.  On 05/11/2018 she had an elective fistula placement to get ready for dialysis.  After this event they did CPR briefly and needed to be intubated and was in the ICU.  She was followed by the critical care team, neurology, nephrology and cardiology while in the ICU.  She was transferred out of the ICU and hospitalist were consulted to take over care.  As per the nursing staff and husband at the bedside the patient is not her usual self.  She is very  lethargic.  Her speech is off and her swallowing is off.  She is slow with following commands.  HOSPITAL COURSE:  Patient was followed up with nephrology.  Her metabolic encephalopathy secondary to uremia.  She had chronic kidney disease stage IV which progressed to end-stage renal disease needing dialysis.  Permacath placement was done during hospitalization.  She also received Epogen shots as recommended by nephrology. multiple dialysis sessions were continued her metabolic encephalopathy improved.  She also completed course of antibiotics for pneumonia during the stay in the hospital.  Blood cultures did not reveal any growth MRSA PCR was negative. Patient mental status completely back to baseline.  She was monitored on telemetry.  She is scheduled for dialysis Tuesday Thursday Saturday.  She had dialysis today.  She will get outpatient physical therapy and home health services.  CONSULTS OBTAINED:  Treatment Team:  Leotis Pain, MD  DRUG ALLERGIES:   Allergies  Allergen Reactions  . Hctz [Hydrochlorothiazide]     pancreatitis  . Amlodipine Swelling    Knees down to ankles  . Amlodipine Besylate Swelling  . Sulfa Antibiotics Rash  . Dairy Aid [Lactase]     Runny nose    DISCHARGE MEDICATIONS:   Allergies as of 05/25/2018      Reactions   Hctz [hydrochlorothiazide]    pancreatitis   Amlodipine Swelling   Knees down to ankles   Amlodipine Besylate Swelling   Sulfa Antibiotics Rash   Dairy Aid [lactase]    Runny nose      Medication List  STOP taking these medications   metoprolol tartrate 50 MG tablet Commonly known as:  LOPRESSOR   oxyCODONE 5 MG immediate release tablet Commonly known as:  Oxy IR/ROXICODONE   spironolactone 50 MG tablet Commonly known as:  ALDACTONE     TAKE these medications   amLODipine 5 MG tablet Commonly known as:  NORVASC Take 1 tablet by mouth once daily   aspirin EC 81 MG tablet Take 81 mg by mouth daily.   atorvastatin 40 MG  tablet Commonly known as:  LIPITOR Take 40 mg by mouth daily at 6 PM.   bumetanide 1 MG tablet Commonly known as:  BUMEX Take 2 mg by mouth 2 (two) times daily.   carvedilol 40 MG 24 hr capsule Commonly known as:  COREG CR Take 40 mg by mouth daily. Takes in the morning when she gets home from work   clopidogrel 75 MG tablet Commonly known as:  PLAVIX Take 1 tablet (75 mg total) by mouth daily.   diltiazem 120 MG 24 hr tablet Commonly known as:  CARDIZEM LA Take 1 tablet (120 mg total) by mouth daily. What changed:  additional instructions   fluticasone 50 MCG/ACT nasal spray Commonly known as:  FLONASE 2 sprays by Each Nare route daily.   hydrALAZINE 50 MG tablet Commonly known as:  APRESOLINE Take 50 mg by mouth every 8 (eight) hours.   HYDROcodone-acetaminophen 5-325 MG tablet Commonly known as:  NORCO/VICODIN Take 1-2 tablets by mouth every 6 (six) hours as needed for moderate pain or severe pain.   insulin aspart 100 UNIT/ML FlexPen Commonly known as:  NOVOLOG Inject 10-12 Units into the skin 3 (three) times daily with meals.   Insulin Glargine 300 UNIT/ML Sopn Inject 40 Units into the skin daily. In the morning.  (toujeo)   losartan 25 MG tablet Commonly known as:  COZAAR Take 1 tablet (25 mg total) by mouth daily. What changed:    medication strength  how much to take   ondansetron 4 MG tablet Commonly known as:  ZOFRAN Take 1 tablet (4 mg total) by mouth every 6 (six) hours as needed for nausea.   torsemide 20 MG tablet Commonly known as:  DEMADEX Take 2 tablets (40 mg total) by mouth daily.   Vitamin D (Ergocalciferol) 50000 units Caps capsule Commonly known as:  DRISDOL TAKE ONE CAPSULE BY MOUTH ONE TIME PER WEEK       Today  Patient seen today 1 more dialysis session today She will be fixed for Tuesday Thursday Saturday dialysis Tolerating diet well Outpatient home physical therapy  VITAL SIGNS:  Blood pressure (!) 204/99, pulse 96,  temperature 98.4 F (36.9 C), temperature source Oral, resp. rate 17, height 5' 3.5" (1.613 m), weight 76.3 kg, SpO2 100 %.  I/O:    Intake/Output Summary (Last 24 hours) at 05/25/2018 1554 Last data filed at 05/25/2018 0508 Gross per 24 hour  Intake 3 ml  Output 300 ml  Net -297 ml    PHYSICAL EXAMINATION:  Physical Exam  GENERAL:  62 y.o.-year-old patient lying in the bed with no acute distress.  LUNGS: Normal breath sounds bilaterally, no wheezing, rales,rhonchi or crepitation. No use of accessory muscles of respiration.  CARDIOVASCULAR: S1, S2 normal. No murmurs, rubs, or gallops.  ABDOMEN: Soft, non-tender, non-distended. Bowel sounds present. No organomegaly or mass.  NEUROLOGIC: Moves all 4 extremities. PSYCHIATRIC: The patient is alert and oriented x 3.  SKIN: No obvious rash, lesion, or ulcer.   DATA REVIEW:  CBC Recent Labs  Lab 05/21/18 0445  WBC 9.2  HGB 8.5*  HCT 24.7*  PLT 175    Chemistries  Recent Labs  Lab 05/25/18 0420  NA 139  K 3.6  CL 102  CO2 32  GLUCOSE 176*  BUN 11  CREATININE 3.29*  CALCIUM 7.9*    Cardiac Enzymes No results for input(s): TROPONINI in the last 168 hours.  Microbiology Results  Results for orders placed or performed during the hospital encounter of 05/11/18  MRSA PCR Screening     Status: None   Collection Time: 05/11/18  7:56 PM  Result Value Ref Range Status   MRSA by PCR NEGATIVE NEGATIVE Final    Comment:        The GeneXpert MRSA Assay (FDA approved for NASAL specimens only), is one component of a comprehensive MRSA colonization surveillance program. It is not intended to diagnose MRSA infection nor to guide or monitor treatment for MRSA infections. Performed at [facility], [address] [place], [place] [address]   CULTURE, BLOOD (ROUTINE X 2) w Reflex to ID Panel     Status: None   Collection Time: 05/13/18  3:18 PM  Result Value Ref Range Status   Specimen Description BLOOD BFOA   Final   Special Requests   Final    BOTTLES DRAWN AEROBIC AND ANAEROBIC Blood Culture adequate volume   Culture   Final    NO GROWTH 5 DAYS Performed at [facility], [address] [place], [place] [address]    Report Status 05/18/2018 FINAL  Final  CULTURE, BLOOD (ROUTINE X 2) w Reflex to ID Panel     Status: None   Collection Time: 05/13/18  4:33 PM  Result Value Ref Range Status   Specimen Description BLOOD L HAND  Final   Special Requests   Final    BOTTLES DRAWN AEROBIC AND ANAEROBIC Blood Culture adequate volume   Culture   Final    NO GROWTH 5 DAYS Performed at [facility], [address], [place], [place] [address]    Report Status 05/18/2018 FINAL  Final    RADIOLOGY:  No results found.  Follow up with PCP in 1 week.  Management plans discussed with the patient, family and they are in agreement.  CODE STATUS: Full code    Code Status Orders  (From admission, onward)         Start     Ordered   05/11/18 2008  Full code  Continuous     05/11/18 2017        Code Status History    Date Active Date Inactive Code Status Order ID Comments User Context   05/11/2018 1711 05/11/2018 2017 Full Code [id number]  Katha Cabal, MD Inpatient   11/01/2017 0345 11/03/2017 1655 Full Code [id number]  Lance Coon, MD Inpatient      TOTAL TIME TAKING CARE OF THIS PATIENT ON DAY OF DISCHARGE: more than 34 minutes.   Saundra Shelling M.D on 05/25/2018 at 3:54 PM  Between 7am to 6pm - Pager - [phone]  After 6pm go to [email or web address] - password [email or web address] [place]  Office  [phone]  CC: Primary care physician; Valera Castle, MD  Note: This dictation was prepared with Dragon dictation along with smaller phrase technology. Any transcriptional errors that result from this process are unintentional.

## 2018-05-25 NOTE — Progress Notes (Signed)
Hd started  

## 2018-05-25 NOTE — Discharge Instructions (Signed)
AMBULATORY SURGERY  DISCHARGE INSTRUCTIONS   1) The drugs that you were given will stay in your system until tomorrow so for the next 24 hours you should not:  A) Drive an automobile B) Make any legal decisions C) Drink any alcoholic beverage   2) You may resume regular meals tomorrow.  Today it is better to start with liquids and gradually work up to solid foods.  You may eat anything you prefer, but it is better to start with liquids, then soup and crackers, and gradually work up to solid foods.   3) Please notify your doctor immediately if you have any unusual bleeding, trouble breathing, redness and pain at the surgery site, drainage, fever, or pain not relieved by medication. 4)   5) Your post-operative visit with Dr.                                     is: Date:                        Time:    Please call to schedule your post-operative visit.  6) Additional Instructions:    [facility] to call patient to arrange outpatient PT

## 2018-05-25 NOTE — Care Management Note (Signed)
Case Management Note  Patient Details  Name: Tracy Jennings MRN: [id number] Date of Birth: 09-19-1955   Patient to discharge home today with husband.  PT has assessed patient and is not recommending outpatient PT.  [facility] outpatient PT was selected by patient and husband.  Referral faxed to [phone].  RNCM spoke with outpatient PT and confirmed they are in network with [facility].  RW was delivered to room by [facility] with [facility].  Patient to start outpatient HD on Tuesday.  Elvera Bicker dialysis liaison notified of discharge.  Estill Bamberg has already discussed with outpatient HD with patient and family.  Husband to transport post discharge.  RNCM signing off.   Subjective/Objective:                    Action/Plan:   Expected Discharge Date:  05/25/18               Expected Discharge Plan:  (outpatient PT)  In-House Referral:     Discharge planning Services  CM Consult  Post Acute Care Choice:  Durable Medical Equipment Choice offered to:  Patient  DME Arranged:  [facility] rolling DME Agency:  [place]:    [place]:     Status of Service:  Completed, signed off  If discussed at [place] of Stay Meetings, dates discussed:    Additional Comments:  Beverly Sessions, RN 05/25/2018, 3:05 PM

## 2018-05-25 NOTE — Progress Notes (Signed)
[facility]  ROUNDING NOTE   Subjective:  We plan for hemodialysis today as the patient will not have outpatient dialysis again until Tuesday. She also had hemodialysis yesterday. Patient appears to be in good spirits this a.m.   Objective:  Vital signs in last 24 hours:  Temp:  [98.3 F (36.8 C)-98.8 F (37.1 C)] 98.3 F (36.8 C) (09/13 0508) Pulse Rate:  [88-99] 91 (09/13 0508) Resp:  [12-29] 18 (09/13 0508) BP: (158-196)/(65-95) 160/65 (09/13 0508) SpO2:  [95 %-100 %] 96 % (09/13 0508) Weight:  [75.7 kg-76.3 kg] 76.3 kg (09/13 0508)  Weight change: -2 kg Filed Weights   05/24/18 0825 05/24/18 1223 05/25/18 0508  Weight: 76.3 kg 75.7 kg 76.3 kg    Intake/Output: I/O last 3 completed shifts: In: 3 [I.V.:3] Out: 2363 [Urine:1350; LKGMW:1027]   Intake/Output this shift:  No intake/output data recorded.  Physical Exam: General: No acute distress, laying in the bed  Head: Moist oral mucus membranes  Eyes: Anicteric   Neck: Supple  Lungs:  normal breathing effort, clear bilateral  Heart: S1S2 no rubs  Abdomen:  Soft, nontender  Extremities: 1+ dependent peripheral edema.  Neurologic: Alert and oriented x 3  Skin: No lesions  Access: Left AVF, R IJ PC    Basic Metabolic Panel: Recent Labs  Lab 05/19/18 0748 05/20/18 0729 05/21/18 0445 05/23/18 1050 05/24/18 0322 05/25/18 0420  NA 143 143 143  --  140 139  K 3.5 3.3* 3.0*  --  2.9* 3.6  CL 105 103 102  --  100 102  CO2 28 29 32  --  35* 32  GLUCOSE 98 90 113*  --  128* 176*  BUN 32* 20 29*  --  12 11  CREATININE 3.55* 2.94* 3.81*  --  3.16* 3.29*  CALCIUM 7.5* 7.7* 8.0*  --  7.7* 7.9*  PHOS  --   --   --  3.4 2.5  --     Liver Function Tests: No results for input(s): AST, ALT, ALKPHOS, BILITOT, PROT, ALBUMIN in the last 168 hours. No results for input(s): LIPASE, AMYLASE in the last 168 hours. No results for input(s): AMMONIA in the last 168 hours.  CBC: Recent Labs  Lab  05/19/18 0748 05/21/18 0445  WBC 9.9 9.2  HGB 8.8* 8.5*  HCT 25.5* 24.7*  MCV 88.6 88.8  PLT 186 175    Cardiac Enzymes: No results for input(s): CKTOTAL, CKMB, CKMBINDEX, TROPONINI in the last 168 hours.  BNP: Invalid input(s): POCBNP  CBG: Recent Labs  Lab 05/24/18 0747 05/24/18 1318 05/24/18 1645 05/24/18 2052 05/25/18 0735  GLUCAP 132* 108* 195* 149* 166*    Microbiology: Results for orders placed or performed during the hospital encounter of 05/11/18  MRSA PCR Screening     Status: None   Collection Time: 05/11/18  7:56 PM  Result Value Ref Range Status   MRSA by PCR NEGATIVE NEGATIVE Final    Comment:        The GeneXpert MRSA Assay (FDA approved for NASAL specimens only), is one component of a comprehensive MRSA colonization surveillance program. It is not intended to diagnose MRSA infection nor to guide or monitor treatment for MRSA infections. Performed at [facility], [address] [place], [place] [address]   CULTURE, BLOOD (ROUTINE X 2) w Reflex to ID Panel     Status: None   Collection Time: 05/13/18  3:18 PM  Result Value Ref Range Status   Specimen Description BLOOD BFOA  Final   Special Requests   Final    BOTTLES DRAWN AEROBIC AND ANAEROBIC Blood Culture adequate volume   Culture   Final    NO GROWTH 5 DAYS Performed at [facility], [address] [place], [place] [address]    Report Status 05/18/2018 FINAL  Final  CULTURE, BLOOD (ROUTINE X 2) w Reflex to ID Panel     Status: None   Collection Time: 05/13/18  4:33 PM  Result Value Ref Range Status   Specimen Description BLOOD L HAND  Final   Special Requests   Final    BOTTLES DRAWN AEROBIC AND ANAEROBIC Blood Culture adequate volume   Culture   Final    NO GROWTH 5 DAYS Performed at [facility], [address] [place], [place] [address]    Report Status 05/18/2018 FINAL  Final    Coagulation Studies: No results for input(s): LABPROT, INR in  the last 72 hours.  Urinalysis: No results for input(s): COLORURINE, LABSPEC, PHURINE, GLUCOSEU, HGBUR, BILIRUBINUR, KETONESUR, PROTEINUR, UROBILINOGEN, NITRITE, LEUKOCYTESUR in the last 72 hours.  Invalid input(s): APPERANCEUR    Imaging: No results found.   Medications:   . anticoagulant sodium citrate     . aspirin  81 mg Per Tube Daily  . atorvastatin  40 mg Oral q1800  . Chlorhexidine Gluconate Cloth  6 each Topical Q0600  . clopidogrel  75 mg Oral Daily  . diltiazem  120 mg Oral Daily  . docusate  100 mg Per Tube BID  . epoetin (EPOGEN/PROCRIT) injection  10,000 Units Intravenous Q T,Th,Sa-HD  . famotidine  20 mg Per Tube QHS  . feeding supplement (NEPRO CARB STEADY)  237 mL Oral BID BM  . hydrALAZINE  50 mg Oral Q8H  . insulin aspart  0-9 Units Subcutaneous TID WC  . losartan  25 mg Oral Daily  . multivitamin  1 tablet Oral QHS  . multivitamin with minerals  1 tablet Oral Daily  . sodium chloride flush  3 mL Intravenous Q12H  . torsemide  40 mg Oral Daily   acetaminophen **OR** acetaminophen, hydrALAZINE, ipratropium-albuterol, oxyCODONE-acetaminophen, sorbitol, traMADol  Assessment/ Plan:  [name] is a 61 y.o. black female with dialysis mellitus type II insulin dependent, hypertension, hyperlipidemia, peripheral vascular disease, chronic pancreatitis, anemia of chronic kidney disease admitted on 05/11/2018 for AVF creation by Dr. Delana Meyer. Post operative complication of cardiac arrest  1. 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 With history of nephrotic range proteinuria.  Baseline creatinine of 4.68, GFR of 11.  Chronic kidney disease secondary to diabetic nephropathy. Discussed various modalities of RRT with patient including HD and PD. Patient chose HD at present. Transplant referral as outpatient -We plan for hemodialysis again today as the patient will not have outpatient dialysis  until Tuesday.  Patient also had hemodialysis yesterday and tolerated well.  2.  Cardiac arrest: placed on hypothermic protocol.  - Appreciate cardiology input  3. Acute resp failure with volume overload -Patient breathing comfortably currently on room air.  Continue to monitor respiratory status.  4. Anemia of chronic kidney disease: normocytic.  Hemoglobin 8.5 at last check.  Maintain the patient on Epogen with dialysis.  5. Diabetes mellitus type II with chronic kidney disease: insulin dependent. History of poor control. Hemoglobin A1c 6.6% on 02/26/18.  Blood glucose 176 this a.m.  Management as per hospitalist.  6. Hypernatremia Resolved, serum sodium was 139 today.  LOS: 14 Tracy Jennings 9/13/20199:34 AM

## 2018-05-25 NOTE — Plan of Care (Signed)
Patient is ambulating in hallway and tolerating regular diet.  Tracy Jennings

## 2018-05-25 NOTE — Progress Notes (Signed)
This note also relates to the following rows which could not be included: Pulse Rate - Cannot attach notes to unvalidated device data BP - Cannot attach notes to unvalidated device data  Hd completed

## 2018-05-29 DIAGNOSIS — Z0289 Encounter for other administrative examinations: Secondary | ICD-10-CM

## 2018-06-01 ENCOUNTER — Telehealth: Payer: Self-pay

## 2018-06-01 NOTE — Telephone Encounter (Signed)
Flagged on EMMI report for not reading discharge papers, not having a follow up scheduled, and for having other questions or problems.  First attempt to reach patient made on 06/01/18 at 2:12pm, however unable to reach or leave message due to mailbox being full.  Will attempt at later time.

## 2018-06-04 ENCOUNTER — Telehealth (INDEPENDENT_AMBULATORY_CARE_PROVIDER_SITE_OTHER): Payer: Self-pay | Admitting: Vascular Surgery

## 2018-06-04 NOTE — Telephone Encounter (Signed)
Patient will need to get new FMLA  paperwork for her PCP due to the fact that the majority of the paperwork has been filled out. This paperwork was dropped off on 06/01/18 and it was explained that it is a week turnaround time for it to be completed.

## 2018-07-02 ENCOUNTER — Encounter (INDEPENDENT_AMBULATORY_CARE_PROVIDER_SITE_OTHER): Payer: Self-pay | Admitting: Vascular Surgery

## 2018-07-02 ENCOUNTER — Other Ambulatory Visit (INDEPENDENT_AMBULATORY_CARE_PROVIDER_SITE_OTHER): Payer: Self-pay | Admitting: Vascular Surgery

## 2018-07-02 ENCOUNTER — Ambulatory Visit (INDEPENDENT_AMBULATORY_CARE_PROVIDER_SITE_OTHER): Payer: Managed Care, Other (non HMO) | Admitting: Vascular Surgery

## 2018-07-02 ENCOUNTER — Ambulatory Visit (INDEPENDENT_AMBULATORY_CARE_PROVIDER_SITE_OTHER): Payer: Managed Care, Other (non HMO)

## 2018-07-02 ENCOUNTER — Telehealth (INDEPENDENT_AMBULATORY_CARE_PROVIDER_SITE_OTHER): Payer: Self-pay | Admitting: Vascular Surgery

## 2018-07-02 VITALS — BP 214/94

## 2018-07-02 VITALS — BP 229/103 | HR 86 | Resp 16 | Ht 63.5 in | Wt 161.4 lb

## 2018-07-02 DIAGNOSIS — E1159 Type 2 diabetes mellitus with other circulatory complications: Secondary | ICD-10-CM

## 2018-07-02 DIAGNOSIS — T829XXD Unspecified complication of cardiac and vascular prosthetic device, implant and graft, subsequent encounter: Secondary | ICD-10-CM

## 2018-07-02 DIAGNOSIS — T829XXS Unspecified complication of cardiac and vascular prosthetic device, implant and graft, sequela: Secondary | ICD-10-CM

## 2018-07-02 DIAGNOSIS — Z992 Dependence on renal dialysis: Secondary | ICD-10-CM

## 2018-07-02 DIAGNOSIS — D75829 Heparin-induced thrombocytopenia, unspecified: Secondary | ICD-10-CM

## 2018-07-02 DIAGNOSIS — N186 End stage renal disease: Secondary | ICD-10-CM

## 2018-07-02 DIAGNOSIS — D7582 Heparin induced thrombocytopenia (HIT): Secondary | ICD-10-CM

## 2018-07-02 DIAGNOSIS — I159 Secondary hypertension, unspecified: Secondary | ICD-10-CM

## 2018-07-02 NOTE — Progress Notes (Signed)
MRN : [id number]  Tracy Jennings is a 62 y.o. (1956-02-17) female who presents with chief complaint of  Chief Complaint  Patient presents with  . Follow-up    ref singh discuss fistula placement  .  History of Present Illness:   The patient returns to the office for evaluation for dialysis access.  She is status post attempted creation of a left brachiocephalic fistula on May 11, 2018.  Her course was complicated with thrombosis immediately postop and then cardiac arrest after the return to the OR for thrombectomy.  Both the specimen retrieved as well as the initial platelet antibody assay were positive for HIT.  However subsequent testing was negative.  Current access is via a catheter which is functioning poorly.  There have been several episodes of catheter infection.  The patient denies amaurosis fugax or recent TIA symptoms. There are no recent neurological changes noted. The patient denies claudication symptoms or rest pain symptoms. The patient denies history of DVT, PE or superficial thrombophlebitis. The patient denies recent episodes of angina or shortness of breath.    Current Meds  Medication Sig  . aspirin EC 81 MG tablet Take 81 mg by mouth daily.  Marland Kitchen atorvastatin (LIPITOR) 40 MG tablet Take 40 mg by mouth daily at 6 PM.   . bumetanide (BUMEX) 1 MG tablet Take 2 mg by mouth 2 (two) times daily.   . carvedilol (COREG CR) 40 MG 24 hr capsule Take 40 mg by mouth daily. Takes in the morning when she gets home from work  . clopidogrel (PLAVIX) 75 MG tablet Take 75 mg by mouth daily.  Marland Kitchen diltiazem (CARDIZEM LA) 120 MG 24 hr tablet Take 1 tablet (120 mg total) by mouth daily. (Patient taking differently: Take 120 mg by mouth daily. Takes in the morning when she gets home from work)  . hydrALAZINE (APRESOLINE) 50 MG tablet Take 50 mg by mouth every 8 (eight) hours.   . Insulin Glargine 300 UNIT/ML SOPN Inject 40 Units into the skin daily. In the morning.  (toujeo)  .  losartan (COZAAR) 25 MG tablet Take 1 tablet (25 mg total) by mouth daily.  . Vitamin D, Ergocalciferol, (DRISDOL) 50000 units CAPS capsule TAKE ONE CAPSULE BY MOUTH ONE TIME PER WEEK    Past Medical History:  Diagnosis Date  . Anemia 04/2018   low iron. to be started on supplements  . CKD (chronic kidney disease)    Stage IV  . Diabetes mellitus without complication ([place])    type II  . ESRD (end stage renal disease) ([place])   . Heart murmur    followed as a child only  . Hyperlipidemia associated with type 2 diabetes mellitus ([place])   . Hypertension   . Pancreatitis   . Peripheral vascular disease [facility])     Past Surgical History:  Procedure Laterality Date  . AMPUTATION TOE Left 2013   2nd toe. tip of toe (toe nail was infected)  . AV FISTULA PLACEMENT Left 05/11/2018   Procedure: ARTERIOVENOUS (AV) FISTULA CREATION;  Surgeon: Katha Cabal, MD;  Location: [facility] ORS;  Service: Vascular;  Laterality: Left;  . CATARACT EXTRACTION    . CHOLECYSTECTOMY  2014  . COLONOSCOPY    . COLONOSCOPY WITH PROPOFOL N/A 01/10/2018   Procedure: COLONOSCOPY WITH PROPOFOL;  Surgeon: Toledo, Benay Pike, MD;  Location: [facility] ENDOSCOPY;  Service: Gastroenterology;  Laterality: N/A;  . DIALYSIS/PERMA CATHETER INSERTION N/A 05/21/2018   Procedure: DIALYSIS/PERMA CATHETER INSERTION;  Surgeon: Lucky Cowboy,  Erskine Squibb, MD;  Location: [place] [facility];  Service: Cardiovascular;  Laterality: N/A;  . EYE SURGERY Bilateral 2018   cataract extractions  . THROMBECTOMY W/ EMBOLECTOMY  05/11/2018   Procedure: THROMBECTOMY ARTERIOVENOUS FISTULA;  Surgeon: Katha Cabal, MD;  Location: [facility] ORS;  Service: Vascular;;  . TUBAL LIGATION  1984    Social History Social History   Tobacco Use  . Smoking status: Never Smoker  . Smokeless tobacco: Never Used  Substance Use Topics  . Alcohol use: No  . Drug use: Never    Family History Family History  Problem Relation Age of Onset  . Stroke Mother   . Hypertension  Mother   . Gout Mother   . Cancer Father   . Diabetes Sister   . Diabetes Maternal Grandmother   . Diabetes Maternal Grandfather   . Diabetes Paternal Grandmother   . Diabetes Paternal Grandfather     Allergies  Allergen Reactions  . Hctz [Hydrochlorothiazide]     pancreatitis  . Amlodipine Swelling    Knees down to ankles  . Amlodipine Besylate Swelling  . Sulfa Antibiotics Rash  . Dairy Aid [Lactase]     Runny nose     REVIEW OF SYSTEMS (Negative unless checked)  Constitutional: [] Weight loss  [] Fever  [] Chills Cardiac: [] Chest pain   [] Chest pressure   [] Palpitations   [] Shortness of breath when laying flat   [] Shortness of breath with exertion. Vascular:  [] Pain in legs with walking   [] Pain in legs at rest  [] History of DVT   [] Phlebitis   [] Swelling in legs   [] Varicose veins   [] Non-healing ulcers Pulmonary:   [] Uses home oxygen   [] Productive cough   [] Hemoptysis   [] Wheeze  [] COPD   [] Asthma Neurologic:  [] Dizziness   [] Seizures   [] History of stroke   [] History of TIA  [] Aphasia   [] Vissual changes   [] Weakness or numbness in arm   [] Weakness or numbness in leg Musculoskeletal:   [] Joint swelling   [] Joint pain   [] Low back pain Hematologic:  [] Easy bruising  [] Easy bleeding   [] Hypercoagulable state   [] Anemic Gastrointestinal:  [] Diarrhea   [] Vomiting  [] Gastroesophageal reflux/heartburn   [] Difficulty swallowing. Genitourinary:  [x] Chronic kidney disease   [] Difficult urination  [] Frequent urination   [] Blood in urine Skin:  [] Rashes   [] Ulcers  Psychological:  [] History of anxiety   []  History of major depression.  Physical Examination  Vitals:   07/02/18 1024  BP: (!) 229/103  Pulse: 86  Resp: 16  Weight: 161 lb 6.4 oz (73.2 kg)  Height: 5' 3.5" (1.613 m)   Body mass index is 28.14 kg/m. Gen: WD/WN, NAD Head: [place]/AT, No temporalis wasting.  Ear/Nose/Throat: Hearing grossly intact, nares w/o erythema or drainage Eyes: PER, EOMI, sclera nonicteric.    Neck: Supple, no large masses.   Pulmonary:  Good air movement, no audible wheezing bilaterally, no use of accessory muscles.  Cardiac: RRR, no JVD Vascular: left antecubital fossa no thrill no bruit palpable cord Vessel Right Left  Radial Palpable Palpable  Ulnar Palpable Palpable  Brachial Palpable Palpable  Carotid Palpable Palpable  Gastrointestinal: Non-distended. No guarding/no peritoneal signs.  Musculoskeletal: M/S 5/5 throughout.  No deformity or atrophy.  Neurologic: CN 2-12 intact. Symmetrical.  Speech is fluent. Motor exam as listed above. Psychiatric: Judgment intact, Mood & affect appropriate for pt's clinical situation. Dermatologic: No rashes or ulcers noted.  No changes consistent with cellulitis. Lymph : No lichenification or skin changes  of chronic lymphedema.  CBC Lab Results  Component Value Date   WBC 9.2 05/21/2018   HGB 8.5 (L) 05/21/2018   HCT 24.7 (L) 05/21/2018   MCV 88.8 05/21/2018   PLT 175 05/21/2018    BMET    Component Value Date/Time   NA 139 05/25/2018 0420   NA 139 06/14/2014 0543   K 3.6 05/25/2018 0420   K 3.6 06/14/2014 0543   CL 102 05/25/2018 0420   CL 105 06/14/2014 0543   CO2 32 05/25/2018 0420   CO2 29 06/14/2014 0543   GLUCOSE 176 (H) 05/25/2018 0420   GLUCOSE 152 (H) 06/14/2014 0543   BUN 11 05/25/2018 0420   BUN 21 (H) 06/14/2014 0543   CREATININE 3.29 (H) 05/25/2018 0420   CREATININE 1.04 06/14/2014 0543   CALCIUM 7.9 (L) 05/25/2018 0420   CALCIUM 8.4 (L) 06/14/2014 0543   GFRNONAA 14 (L) 05/25/2018 0420   GFRNONAA 58 (L) 06/14/2014 0543   GFRNONAA >60 12/10/2013 0407   GFRAA 16 (L) 05/25/2018 0420   GFRAA >60 06/14/2014 0543   GFRAA >60 12/10/2013 0407   CrCl cannot be calculated (Patient's most recent lab result is older than the maximum 21 days allowed.).  COAG Lab Results  Component Value Date   INR 1.04 05/12/2018   INR 1.07 05/11/2018   INR 0.97 05/04/2018    Radiology No results  found.   Assessment/Plan 1. Complication of vascular access for dialysis, sequela Recommend:  The patient currently has catheter based access.  Patient should have vein mapping of his arms to plan for upper extremity access creation.  The goal is to improve the quality of dialysis therapy.  The risks, benefits and alternative therapies with respect to to the different kinds of dialysis accesee were reviewed in detail with the patient.  All questions were answered.  The patient agrees to proceed with mapping.     A total of 30 minutes was spent with this patient and greater than 50% was spent in counseling and coordination of care with the patient.  Discussion included the treatment options for vascular access crreation including indications for surgery and intervention.  Also discussed is the appropriate timing of treatment.  In addition medical therapy was discussed.  - VAS [place] UPPER EXT VEIN MAPPING (PRE-OP AVF); Future  2. End stage renal disease ([place]) Continue dialysis without interuption  3. Secondary hypertension Continue antihypertensive medications as already ordered, these medications have been reviewed and there are no changes at this time.   4. Type 2 diabetes mellitus with other circulatory complication, unspecified whether long term insulin use (HCC) Continue hypoglycemic medications as already ordered, these medications have been reviewed and there are no changes at this time.  Hgb A1C to be monitored as already arranged by primary service   5. HIT (heparin-induced thrombocytopenia) (HCC) No heparin Will plan for a Artegraft if needed   Hortencia Pilar, MD  07/02/2018 10:31 AM

## 2018-07-02 NOTE — Telephone Encounter (Signed)
Called the patient to inform her that I spoke with Dr. Delana Meyer and that we will be moving forward with a left upper extremity fistulogram with possible intervention in an attempt to restore function to her dialysis access.  The patient's mailbox was full and I was unable to leave a message.

## 2018-07-03 ENCOUNTER — Encounter (INDEPENDENT_AMBULATORY_CARE_PROVIDER_SITE_OTHER): Payer: Self-pay | Admitting: Vascular Surgery

## 2018-07-03 NOTE — Progress Notes (Signed)
Subjective:    Patient ID: Tracy Jennings, female    DOB: Jan 09, 1956, 62 y.o.   MRN: [id number] Chief Complaint  Patient presents with  . Follow-up    HDA follow up   Patient presents to review vascular studies.  The patient was seen earlier this morning by Dr. Delana Meyer and evaluation of no bruit or thrill to the left upper extremity brachiocephalic AV fistula.  The patient is currently being maintained by a right PermCath without issue.  The patient denies any left upper extremity pain or ulceration to the hand.  The patient did have a complicated inpatient postoperative course consisting of a respiratory arrest and chest compressions in the operating room and the possibility of a positive HIT assay.  The patient underwent a left upper extremity dialysis access duplex which was notable for an occluded left brachiocephalic AV fistula with biphasic blood flow noted in the brachial inflow artery.  The patient was also asking for refills of her Plavix and losartan as she stated she tried to contact her primary care doctor who "would not refill them until she was seen".  The patient's blood pressure was 214/94 today.  The patient denies any fever, nausea vomiting.  Review of Systems  Constitutional: Negative.   HENT: Negative.   Eyes: Negative.   Respiratory: Negative.   Cardiovascular: Negative.   Gastrointestinal: Negative.   Endocrine: Negative.   Genitourinary:       ESRD  Musculoskeletal: Negative.   Skin: Negative.   Allergic/Immunologic: Negative.   Neurological: Negative.   Hematological: Negative.   Psychiatric/Behavioral: Negative.       Objective:   Physical Exam  Constitutional: She is oriented to person, place, and time. She appears well-developed and well-nourished. No distress.  HENT:  Head: Normocephalic and atraumatic.  Right Ear: External ear normal.  Left Ear: External ear normal.  Eyes: Pupils are equal, round, and reactive to light. Conjunctivae and EOM are  normal.  Neck: Normal range of motion.  Cardiovascular: Normal rate, regular rhythm, normal heart sounds and intact distal pulses.  Pulses:      Radial pulses are 2+ on the right side, and 2+ on the left side.  Left upper extremity brachiocephalic: Incision is healed.  No bruit or thrill noted.  No acute vascular compromise to the left upper extremities noted. Right PermCath intact clean and dry.  No signs of infection.  Pulmonary/Chest: Effort normal and breath sounds normal.  Musculoskeletal: Normal range of motion. She exhibits no edema.  Neurological: She is alert and oriented to person, place, and time.  Skin: Skin is warm and dry. She is not diaphoretic.  Psychiatric: She has a normal mood and affect. Her behavior is normal. Judgment and thought content normal.  Vitals reviewed.  BP (!) 214/94 (BP Location: Right Arm, Patient Position: Sitting)   Past Medical History:  Diagnosis Date  . Anemia 04/2018   low iron. to be started on supplements  . CKD (chronic kidney disease)    Stage IV  . Diabetes mellitus without complication ([place])    type II  . ESRD (end stage renal disease) ([place])   . Heart murmur    followed as a child only  . Hyperlipidemia associated with type 2 diabetes mellitus ([place])   . Hypertension   . Pancreatitis   . Peripheral vascular disease ([place])    Social History   Socioeconomic History  . Marital status: Married    Spouse name: clarence  . Number of children: Not  on file  . Years of education: Not on file  . Highest education level: Not on file  Occupational History    Comment: works nights  Social Needs  . Financial resource strain: Not on file  . Food insecurity:    Worry: Not on file    Inability: Not on file  . Transportation needs:    Medical: Not on file    Non-medical: Not on file  Tobacco Use  . Smoking status: Never Smoker  . Smokeless tobacco: Never Used  Substance and Sexual Activity  . Alcohol use: No  . Drug use: Never  .  Sexual activity: Not Currently  Lifestyle  . Physical activity:    Days per week: Not on file    Minutes per session: Not on file  . Stress: Not on file  Relationships  . Social connections:    Talks on phone: Not on file    Gets together: Not on file    Attends religious service: Not on file    Active member of club or organization: Not on file    Attends meetings of clubs or organizations: Not on file    Relationship status: Not on file  . Intimate partner violence:    Fear of current or ex partner: Not on file    Emotionally abused: Not on file    Physically abused: Not on file    Forced sexual activity: Not on file  Other Topics Concern  . Not on file  Social History Narrative  . Not on file   Past Surgical History:  Procedure Laterality Date  . AMPUTATION TOE Left 2013   2nd toe. tip of toe (toe nail was infected)  . AV FISTULA PLACEMENT Left 05/11/2018   Procedure: ARTERIOVENOUS (AV) FISTULA CREATION;  Surgeon: Katha Cabal, MD;  Location: [facility] ORS;  Service: Vascular;  Laterality: Left;  . CATARACT EXTRACTION    . CHOLECYSTECTOMY  2014  . COLONOSCOPY    . COLONOSCOPY WITH PROPOFOL N/A 01/10/2018   Procedure: COLONOSCOPY WITH PROPOFOL;  Surgeon: Toledo, Benay Pike, MD;  Location: [facility] ENDOSCOPY;  Service: Gastroenterology;  Laterality: N/A;  . DIALYSIS/PERMA CATHETER INSERTION N/A 05/21/2018   Procedure: DIALYSIS/PERMA CATHETER INSERTION;  Surgeon: Algernon Huxley, MD;  Location: [place] [facility];  Service: Cardiovascular;  Laterality: N/A;  . EYE SURGERY Bilateral 2018   cataract extractions  . THROMBECTOMY W/ EMBOLECTOMY  05/11/2018   Procedure: THROMBECTOMY ARTERIOVENOUS FISTULA;  Surgeon: Katha Cabal, MD;  Location: [facility] ORS;  Service: Vascular;;  . TUBAL LIGATION  1984   Family History  Problem Relation Age of Onset  . Stroke Mother   . Hypertension Mother   . Gout Mother   . Cancer Father   . Diabetes Sister   . Diabetes Maternal Grandmother   .  Diabetes Maternal Grandfather   . Diabetes Paternal Grandmother   . Diabetes Paternal Grandfather    Allergies  Allergen Reactions  . Hctz [Hydrochlorothiazide]     pancreatitis  . Amlodipine Swelling    Knees down to ankles  . Amlodipine Besylate Swelling  . Sulfa Antibiotics Rash  . Dairy Aid [Lactase]     Runny nose      Assessment & Plan:  Patient presents to review vascular studies.  The patient was seen earlier this morning by Dr. Delana Meyer and evaluation of no bruit or thrill to the left upper extremity brachiocephalic AV fistula.  The patient is currently being maintained by a right PermCath without issue.  The patient denies any left upper extremity pain or ulceration to the hand.  The patient did have a complicated inpatient postoperative course consisting of a respiratory arrest and chest compressions in the operating room and the possibility of a positive HIT assay.  The patient underwent a left upper extremity dialysis access duplex which was notable for an occluded left brachiocephalic AV fistula with biphasic blood flow noted in the brachial inflow artery.  The patient was also asking for refills of her Plavix and losartan as she stated she tried to contact her primary care doctor who "would not refill them until she was seen".  The patient's blood pressure was 214/94 today.  The patient denies any fever, nausea vomiting.  1. End stage renal disease ([place]) - Stable Patient presents with an occluded left brachiocephalic AV fistula The patient is currently being maintained by a right PermCath however this is not a permanent access for dialysis The patient did have a complicated postoperative course requiring chest compressions in the operating room and the possibility of a positive HIT assay After discussion with Dr. Delana Meyer, recommend moving forward with a left upper extremity fistulogram with possible intervention to assess the patient's anatomy and restore function to her access  if possible. Heparin or heparin coated equipment cannot be used. Procedure, risks and benefits explained to the patient Questions answered The patient wishes to proceed  2. Complication of vascular access for dialysis, sequela - Stable As above  Current Outpatient Medications on File Prior to Visit  Medication Sig Dispense Refill  . amLODipine (NORVASC) 5 MG tablet Take 1 tablet by mouth once daily    . aspirin EC 81 MG tablet Take 81 mg by mouth daily.    Marland Kitchen atorvastatin (LIPITOR) 40 MG tablet Take 40 mg by mouth daily at 6 PM.     . bumetanide (BUMEX) 1 MG tablet Take 2 mg by mouth 2 (two) times daily.     . carvedilol (COREG CR) 40 MG 24 hr capsule Take 40 mg by mouth daily. Takes in the morning when she gets home from work    . clopidogrel (PLAVIX) 75 MG tablet Take 75 mg by mouth daily.    Marland Kitchen diltiazem (CARDIZEM LA) 120 MG 24 hr tablet Take 1 tablet (120 mg total) by mouth daily. (Patient taking differently: Take 120 mg by mouth daily. Takes in the morning when she gets home from work) 30 tablet 1  . diltiazem (TIAZAC) 120 MG 24 hr capsule TK 1 C PO QD  2  . fluticasone (FLONASE) 50 MCG/ACT nasal spray 2 sprays by Each Nare route daily.    Marland Kitchen glucose blood (PRECISION QID TEST) test strip Use once daily One Touch Ultra Blue    . hydrALAZINE (APRESOLINE) 50 MG tablet Take 50 mg by mouth every 8 (eight) hours.     Marland Kitchen HYDROcodone-acetaminophen (NORCO) 5-325 MG tablet Take 1-2 tablets by mouth every 6 (six) hours as needed for moderate pain or severe pain. 40 tablet 0  . insulin aspart (NOVOLOG) 100 UNIT/ML FlexPen Inject 10-12 Units into the skin 3 (three) times daily with meals.     . Insulin Glargine 300 UNIT/ML SOPN Inject 40 Units into the skin daily. In the morning.  (toujeo)    . losartan (COZAAR) 25 MG tablet Take 1 tablet (25 mg total) by mouth daily. 30 tablet 0  . ondansetron (ZOFRAN) 4 MG tablet Take 1 tablet (4 mg total) by mouth every 6 (six) hours as needed for nausea.  20 tablet  0  . Vitamin D, Ergocalciferol, (DRISDOL) 50000 units CAPS capsule TAKE ONE CAPSULE BY MOUTH ONE TIME PER WEEK  0  . torsemide (DEMADEX) 20 MG tablet Take 2 tablets (40 mg total) by mouth daily. 60 tablet 0   No current facility-administered medications on file prior to visit.    There are no Patient Instructions on file for this visit. No follow-ups on file.  Deklen Popelka A Quintessa Simmerman, PA-C

## 2018-07-04 NOTE — Telephone Encounter (Signed)
I attempted to contact the patient to schedule her fistulagram and her voice mail box was full so I was unable to leave a message. I then called the patient's husband and was told he was trying to call her as well and that he would have her call when she got home later today.

## 2018-07-06 ENCOUNTER — Telehealth (INDEPENDENT_AMBULATORY_CARE_PROVIDER_SITE_OTHER): Payer: Self-pay

## 2018-07-06 NOTE — Telephone Encounter (Signed)
Patient called with concerns regarding being put to sleep and what a fistulagram was. She was also wanting an answer as to why she coded after her surgery. I explained that we can make an appt for her to come in and speak with either the doctor or Hezzie Bump PA again, she is also concerned that she is out of work and having to pay a co-pay as well. I let her know that I would send her message to [facility] PA and we will return a call to her to let her know the recommendation.

## 2018-07-08 ENCOUNTER — Encounter (INDEPENDENT_AMBULATORY_CARE_PROVIDER_SITE_OTHER): Payer: Self-pay | Admitting: Vascular Surgery

## 2018-07-08 DIAGNOSIS — D75829 Heparin-induced thrombocytopenia, unspecified: Secondary | ICD-10-CM | POA: Insufficient documentation

## 2018-07-08 DIAGNOSIS — D7582 Heparin induced thrombocytopenia (HIT): Secondary | ICD-10-CM | POA: Insufficient documentation

## 2018-07-09 NOTE — Telephone Encounter (Signed)
The patient did walk-in to our office today stating she wanted to schedule her fistulagram, unfortunately at the time I received the message I was on the phone.I returned a call to the patient and gave the patient all of the recommendations and information that was written. This is a return call from Friday, The patient stated she went to her PCP and will be set-op with either [facility] or [facility] for further work up.

## 2018-07-09 NOTE — Telephone Encounter (Signed)
I have a long discussion with the patient in regard to what a fistulogram was (including the procedure itself, risks and benefits). We also discussed the kind of sedation that was going to be used and how it differed from general anesthesia. I also had a discussion about her HIT panel and the possible causes of her post-operative issues. Im not sure I can answer her co-pay questions / her portion of the procedure cost. I think at this point, she may want to come back and speak with Dr. Delana Meyer. It also may be helpful to have a family member or friend with her during the discussion.

## 2018-10-11 MED ORDER — POLYETHYLENE GLYCOL 3350 17 G PO PACK
17.00 | PACK | ORAL | Status: DC
Start: ? — End: 2018-10-11

## 2018-10-11 MED ORDER — INSULIN LISPRO 100 UNIT/ML ~~LOC~~ SOLN
0.00 | SUBCUTANEOUS | Status: DC
Start: 2018-10-11 — End: 2018-10-11

## 2018-10-11 MED ORDER — MELATONIN 3 MG PO TABS
3.00 | ORAL_TABLET | ORAL | Status: DC
Start: ? — End: 2018-10-11

## 2018-10-11 MED ORDER — ATORVASTATIN CALCIUM 40 MG PO TABS
40.00 | ORAL_TABLET | ORAL | Status: DC
Start: 2018-10-11 — End: 2018-10-11

## 2018-10-11 MED ORDER — EPOETIN ALFA-EPBX 2000 UNIT/ML IJ SOLN
2000.00 | INTRAMUSCULAR | Status: DC
Start: 2018-10-12 — End: 2018-10-11

## 2018-10-11 MED ORDER — PARICALCITOL 2 MCG/ML IV SOLN
4.00 | INTRAVENOUS | Status: DC
Start: 2018-10-12 — End: 2018-10-11

## 2018-10-11 MED ORDER — LOSARTAN POTASSIUM 50 MG PO TABS
50.00 | ORAL_TABLET | ORAL | Status: DC
Start: 2018-10-12 — End: 2018-10-11

## 2018-10-11 MED ORDER — PANCRELIPASE (LIP-PROT-AMYL) 3000-9500 UNITS PO CPEP
1.00 | ORAL_CAPSULE | ORAL | Status: DC
Start: 2018-10-11 — End: 2018-10-11

## 2018-10-11 MED ORDER — CALCIUM ACETATE (PHOS BINDER) 667 MG PO CAPS
1334.00 | ORAL_CAPSULE | ORAL | Status: DC
Start: 2018-10-11 — End: 2018-10-11

## 2018-10-11 MED ORDER — GENERIC EXTERNAL MEDICATION
1.50 | Status: DC
Start: ? — End: 2018-10-11

## 2018-10-11 MED ORDER — CARVEDILOL 12.5 MG PO TABS
12.50 | ORAL_TABLET | ORAL | Status: DC
Start: 2018-10-11 — End: 2018-10-11

## 2018-10-11 MED ORDER — LABETALOL HCL 5 MG/ML IV SOLN
20.00 | INTRAVENOUS | Status: DC
Start: ? — End: 2018-10-11

## 2018-10-11 MED ORDER — GENERIC EXTERNAL MEDICATION
12.50 | Status: DC
Start: ? — End: 2018-10-11

## 2018-10-11 MED ORDER — GENERIC EXTERNAL MEDICATION
5.00 | Status: DC
Start: ? — End: 2018-10-11

## 2018-10-11 MED ORDER — DEXTROSE 50 % IV SOLN
12.50 | INTRAVENOUS | Status: DC
Start: ? — End: 2018-10-11

## 2018-10-11 MED ORDER — FUROSEMIDE 80 MG PO TABS
80.00 | ORAL_TABLET | ORAL | Status: DC
Start: 2018-10-12 — End: 2018-10-11

## 2018-10-11 MED ORDER — DILTIAZEM HCL ER COATED BEADS 120 MG PO CP24
120.00 | ORAL_CAPSULE | ORAL | Status: DC
Start: 2018-10-12 — End: 2018-10-11

## 2018-10-11 MED ORDER — FLUTICASONE PROPIONATE 50 MCG/ACT NA SUSP
2.00 | NASAL | Status: DC
Start: 2018-10-12 — End: 2018-10-11

## 2018-11-08 ENCOUNTER — Encounter: Payer: Self-pay | Admitting: *Deleted

## 2019-04-08 ENCOUNTER — Telehealth (INDEPENDENT_AMBULATORY_CARE_PROVIDER_SITE_OTHER): Payer: Self-pay

## 2019-04-08 ENCOUNTER — Other Ambulatory Visit (INDEPENDENT_AMBULATORY_CARE_PROVIDER_SITE_OTHER): Payer: Self-pay | Admitting: Nurse Practitioner

## 2019-04-08 NOTE — Telephone Encounter (Signed)
A fax was received from Tracy Jennings for a permcath removal. Patient was scheduled with Dr. Lucky Cowboy for 04/11/2019 with a 10:30 am arrival time to the [facility]. Patient will do Covid testing on 04/09/2019 between 12:30-2:30 pm at the [facility]. Patient does PD dialysis at home. The pre-procedure instructions along with dates and times were faxed back to [facility].

## 2019-04-09 ENCOUNTER — Other Ambulatory Visit: Payer: Self-pay

## 2019-04-09 ENCOUNTER — Other Ambulatory Visit
Admission: RE | Admit: 2019-04-09 | Discharge: 2019-04-09 | Disposition: A | Discharge status: Home / Self Care | Payer: 59 | Source: Ambulatory Visit | Attending: Vascular Surgery | Admitting: Vascular Surgery

## 2019-04-09 DIAGNOSIS — Z20828 Contact with and (suspected) exposure to other viral communicable diseases: Secondary | ICD-10-CM | POA: Insufficient documentation

## 2019-04-09 DIAGNOSIS — Z20822 Contact with and (suspected) exposure to covid-19: Secondary | ICD-10-CM

## 2019-04-10 LAB — SARS CORONAVIRUS 2 (TAT 6-24 HRS): SARS Coronavirus 2: NEGATIVE

## 2019-04-11 ENCOUNTER — Ambulatory Visit
Admission: RE | Admit: 2019-04-11 | Discharge: 2019-04-11 | Disposition: A | Discharge status: Home / Self Care | Payer: 59 | Source: Ambulatory Visit | Attending: Vascular Surgery | Admitting: Vascular Surgery

## 2019-04-11 ENCOUNTER — Encounter
Admission: RE | Disposition: A | Discharge status: Home / Self Care | Payer: Self-pay | Source: Ambulatory Visit | Attending: Vascular Surgery

## 2019-04-11 ENCOUNTER — Encounter: Payer: Self-pay | Admitting: Vascular Surgery

## 2019-04-11 DIAGNOSIS — E11649 Type 2 diabetes mellitus with hypoglycemia without coma: Secondary | ICD-10-CM | POA: Diagnosis not present

## 2019-04-11 DIAGNOSIS — Z833 Family history of diabetes mellitus: Secondary | ICD-10-CM | POA: Insufficient documentation

## 2019-04-11 DIAGNOSIS — E785 Hyperlipidemia, unspecified: Secondary | ICD-10-CM | POA: Insufficient documentation

## 2019-04-11 DIAGNOSIS — E1122 Type 2 diabetes mellitus with diabetic chronic kidney disease: Secondary | ICD-10-CM | POA: Insufficient documentation

## 2019-04-11 DIAGNOSIS — Z882 Allergy status to sulfonamides status: Secondary | ICD-10-CM | POA: Diagnosis not present

## 2019-04-11 DIAGNOSIS — Z452 Encounter for adjustment and management of vascular access device: Secondary | ICD-10-CM | POA: Insufficient documentation

## 2019-04-11 DIAGNOSIS — Z992 Dependence on renal dialysis: Secondary | ICD-10-CM | POA: Diagnosis not present

## 2019-04-11 DIAGNOSIS — E1136 Type 2 diabetes mellitus with diabetic cataract: Secondary | ICD-10-CM | POA: Diagnosis not present

## 2019-04-11 DIAGNOSIS — Z9851 Tubal ligation status: Secondary | ICD-10-CM | POA: Insufficient documentation

## 2019-04-11 DIAGNOSIS — E1151 Type 2 diabetes mellitus with diabetic peripheral angiopathy without gangrene: Secondary | ICD-10-CM | POA: Insufficient documentation

## 2019-04-11 DIAGNOSIS — Z888 Allergy status to other drugs, medicaments and biological substances status: Secondary | ICD-10-CM | POA: Insufficient documentation

## 2019-04-11 DIAGNOSIS — Z823 Family history of stroke: Secondary | ICD-10-CM | POA: Insufficient documentation

## 2019-04-11 DIAGNOSIS — I120 Hypertensive chronic kidney disease with stage 5 chronic kidney disease or end stage renal disease: Secondary | ICD-10-CM | POA: Diagnosis not present

## 2019-04-11 DIAGNOSIS — N186 End stage renal disease: Secondary | ICD-10-CM | POA: Insufficient documentation

## 2019-04-11 DIAGNOSIS — Z8249 Family history of ischemic heart disease and other diseases of the circulatory system: Secondary | ICD-10-CM | POA: Insufficient documentation

## 2019-04-11 DIAGNOSIS — Z89422 Acquired absence of other left toe(s): Secondary | ICD-10-CM | POA: Insufficient documentation

## 2019-04-11 HISTORY — PX: DIALYSIS/PERMA CATHETER REMOVAL: CATH118289

## 2019-04-11 SURGERY — DIALYSIS/PERMA CATHETER REMOVAL
Anesthesia: LOCAL

## 2019-04-11 MED ORDER — CEFAZOLIN SODIUM-DEXTROSE 1-4 GM/50ML-% IV SOLN: 1.0000 g | Freq: Once | INTRAVENOUS | Status: DC

## 2019-04-11 MED ORDER — LIDOCAINE-EPINEPHRINE (PF) 1 %-1:200000 IJ SOLN
INTRAMUSCULAR | Status: DC | PRN
  Administered 2019-04-11: 20 mL via INTRADERMAL

## 2019-04-11 SURGICAL SUPPLY — 2 items
FORCEPS HALSTEAD CVD 5IN STRL (INSTRUMENTS) ×1 IMPLANT
TRAY LACERAT/PLASTIC (MISCELLANEOUS) ×1 IMPLANT

## 2019-04-11 NOTE — Discharge Instructions (Signed)
Incision Care, Adult °An incision is a cut that a doctor makes in your skin for surgery (for a procedure). Most times, these cuts are closed after surgery. Your cut from surgery may be closed with stitches (sutures), staples, skin glue, or skin tape (adhesive strips). You may need to return to your doctor to have stitches or staples taken out. This may happen many days or many weeks after your surgery. The cut needs to be well cared for so it does not get infected. °How to care for your cut °Cut care ° °· Follow instructions from your doctor about how to take care of your cut. Make sure you: °? Wash your hands with soap and water before you change your bandage (dressing). If you cannot use soap and water, use hand sanitizer. °? Change your bandage as told by your doctor. °? Leave stitches, skin glue, or skin tape in place. They may need to stay in place for 2 weeks or longer. If tape strips get loose and curl up, you may trim the loose edges. Do not remove tape strips completely unless your doctor says it is okay. °· Check your cut area every day for signs of infection. Check for: °? More redness, swelling, or pain. °? More fluid or blood. °? Warmth. °? Pus or a bad smell. °· Ask your doctor how to clean the cut. This may include: °? Using mild soap and water. °? Using a clean towel to pat the cut dry after you clean it. °? Putting a cream or ointment on the cut. Do this only as told by your doctor. °? Covering the cut with a clean bandage. °· Ask your doctor when you can leave the cut uncovered. °· Do not take baths, swim, or use a hot tub until your doctor says it is okay. Ask your doctor if you can take showers. You may only be allowed to take sponge baths for bathing. °Medicines °· If you were prescribed an antibiotic medicine, cream, or ointment, take the antibiotic or put it on the cut as told by your doctor. Do not stop taking or putting on the antibiotic even if your condition gets better. °· Take  over-the-counter and prescription medicines only as told by your doctor. °General instructions °· Limit movement around your cut. This helps healing. °? Avoid straining, lifting, or exercise for the first month, or for as long as told by your doctor. °? Follow instructions from your doctor about going back to your normal activities. °? Ask your doctor what activities are safe. °· Protect your cut from the sun when you are outside for the first 6 months, or for as long as told by your doctor. Put on sunscreen around the scar or cover up the scar. °· Keep all follow-up visits as told by your doctor. This is important. °Contact a doctor if: °· Your have more redness, swelling, or pain around the cut. °· You have more fluid or blood coming from the cut. °· Your cut feels warm to the touch. °· You have pus or a bad smell coming from the cut. °· You have a fever or shaking chills. °· You feel sick to your stomach (nauseous) or you throw up (vomit). °· You are dizzy. °· Your stitches or staples come undone. °Get help right away if: °· You have a red streak coming from your cut. °· Your cut bleeds through the bandage and the bleeding does not stop with gentle pressure. °· The edges of your cut   open up and separate. °· You have very bad (severe) pain. °· You have a rash. °· You are confused. °· You pass out (faint). °· You have trouble breathing and you have a fast heartbeat. °This information is not intended to replace advice given to you by your health care provider. Make sure you discuss any questions you have with your health care provider. °Document Released: 11/21/2011 Document Revised: 01/16/2017 Document Reviewed: 05/06/2016 °Elsevier Patient Education © 2020 [employer] ° °

## 2019-04-11 NOTE — H&P (Signed)
[facility] Admission History & Physical  MRN : [id number]  Tracy Jennings is a 63 y.o. (11-08-1955) female who presents with chief complaint of No chief complaint on file. Marland Kitchen  History of Present Illness: I am asked to evaluate the patient by the dialysis center. The patient was sent here because they have a nonfunctioning tunneled catheter and a functioning access.  The patient reports they're not been any problems with any of their dialysis runs. They are reporting good flows with good parameters at dialysis.  Patient denies pain or tenderness overlying the access.  There is no pain with dialysis.  The patient denies hand pain or finger pain consistent with steal syndrome.  No fevers or chills while on dialysis.   Current Facility-Administered Medications  Medication Dose Route Frequency Provider Last Rate Last Dose  . ceFAZolin (ANCEF) IVPB 1 g/50 mL premix  1 g Intravenous Once Kris Hartmann, NP        Past Medical History:  Diagnosis Date  . Anemia 04/2018   low iron. to be started on supplements  . CKD (chronic kidney disease)    Stage IV  . Diabetes mellitus without complication ([place])    type II  . ESRD (end stage renal disease) ([place])   . Heart murmur    followed as a child only  . Hyperlipidemia associated with type 2 diabetes mellitus ([place])   . Hypertension   . Pancreatitis   . Peripheral vascular disease [facility])     Past Surgical History:  Procedure Laterality Date  . AMPUTATION TOE Left 2013   2nd toe. tip of toe (toe nail was infected)  . AV FISTULA PLACEMENT Left 05/11/2018   Procedure: ARTERIOVENOUS (AV) FISTULA CREATION;  Surgeon: Katha Cabal, MD;  Location: [facility];  Service: Vascular;  Laterality: Left;  . CATARACT EXTRACTION    . CHOLECYSTECTOMY  2014  . COLONOSCOPY    . COLONOSCOPY WITH PROPOFOL N/A 01/10/2018   Procedure: COLONOSCOPY WITH PROPOFOL;  Surgeon: Toledo, Benay Pike, MD;  Location: [facility] END[facility]COPY;  Service:  Gastroenterology;  Laterality: N/A;  . DIALYSIS/PERMA CATHETER INSERTION N/A 05/21/2018   Procedure: DIALYSIS/PERMA CATHETER INSERTION;  Surgeon: Algernon Huxley, MD;  Location: [facility];  Service: Cardiovascular;  Laterality: N/A;  . EYE SURGERY Bilateral 2018   cataract extractions  . THROMBECTOMY W/ EMBOLECTOMY  05/11/2018   Procedure: THROMBECTOMY ARTERIOVENOUS FISTULA;  Surgeon: Katha Cabal, MD;  Location: [facility];  Service: Vascular;;  . TUBAL LIGATION  1984    Social History Social History   Tobacco Use  . Smoking status: Never Smoker  . Smokeless tobacco: Never Used  Substance Use Topics  . Alcohol use: No  . Drug use: Never    Family History Family History  Problem Relation Age of Onset  . Stroke Mother   . Hypertension Mother   . Gout Mother   . Cancer Father   . Diabetes Sister   . Diabetes Maternal Grandmother   . Diabetes Maternal Grandfather   . Diabetes Paternal Grandmother   . Diabetes Paternal Grandfather     No family history of bleeding or clotting disorders, autoimmune disease or porphyria  Allergies  Allergen Reactions  . Hctz [Hydrochlorothiazide]     pancreatitis  . Amlodipine Swelling    Knees down to ankles  . Amlodipine Besylate Swelling  . Sulfa Antibiotics Rash  . Dairy Aid [Lactase]     Runny nose     REVIEW OF  SYSTEMS (Negative unless checked)  Constitutional: [] Weight loss  [] Fever  [] Chills Cardiac: [] Chest pain   [] Chest pressure   [] Palpitations   [] Shortness of breath when laying flat   [] Shortness of breath at rest   [x] Shortness of breath with exertion. Vascular:  [] Pain in legs with walking   [] Pain in legs at rest   [] Pain in legs when laying flat   [] Claudication   [] Pain in feet when walking  [] Pain in feet at rest  [] Pain in feet when laying flat   [] History of DVT   [] Phlebitis   [] Swelling in legs   [] Varicose veins   [] Non-healing ulcers Pulmonary:   [] Uses home oxygen   [] Productive cough   [] Hemoptysis    [] Wheeze  [] COPD   [] Asthma Neurologic:  [] Dizziness  [] Blackouts   [] Seizures   [] History of stroke   [] History of TIA  [] Aphasia   [] Temporary blindness   [] Dysphagia   [] Weakness or numbness in arms   [] Weakness or numbness in legs Musculoskeletal:  [] Arthritis   [] Joint swelling   [] Joint pain   [] Low back pain Hematologic:  [] Easy bruising  [] Easy bleeding   [] Hypercoagulable state   [x] Anemic  [] Hepatitis Gastrointestinal:  [] Blood in stool   [] Vomiting blood  [] Gastroesophageal reflux/heartburn   [] Difficulty swallowing. Genitourinary:  [x] Chronic kidney disease   [] Difficult urination  [] Frequent urination  [] Burning with urination   [] Blood in urine Skin:  [] Rashes   [] Ulcers   [] Wounds Psychological:  [] History of anxiety   []  History of major depression.  Physical Examination  Vitals:   04/11/19 1107  Temp: 98.2 F (36.8 C)  TempSrc: Oral  SpO2: 100%   There is no height or weight on file to calculate BMI. Gen: WD/WN, NAD Head: [place]/AT, No temporalis wasting.  Ear/Nose/Throat: Hearing grossly intact, nares w/o erythema or drainage, oropharynx w/o Erythema/Exudate,  Eyes: Conjunctiva clear, sclera non-icteric Neck: Trachea midline.  No JVD.  Pulmonary:  Good air movement, respirations not labored, no use of accessory muscles.  Cardiac: RRR, normal S1, S2. Vascular:  Vessel Right Left  Radial Palpable Palpable   Musculoskeletal: M/S 5/5 throughout.  Extremities without ischemic changes.  No deformity or atrophy.  Neurologic: Sensation grossly intact in extremities.  Symmetrical.  Speech is fluent. Motor exam as listed above. Psychiatric: Judgment intact, Mood & affect appropriate for pt's clinical situation. Dermatologic: No rashes or ulcers noted.  No cellulitis or open wounds.    CBC Lab Results  Component Value Date   WBC 9.2 05/21/2018   HGB 8.5 (L) 05/21/2018   HCT 24.7 (L) 05/21/2018   MCV 88.8 05/21/2018   PLT 175 05/21/2018    BMET    Component Value  Date/Time   NA 139 05/25/2018 0420   NA 139 06/14/2014 0543   K 3.6 05/25/2018 0420   K 3.6 06/14/2014 0543   CL 102 05/25/2018 0420   CL 105 06/14/2014 0543   CO2 32 05/25/2018 0420   CO2 29 06/14/2014 0543   GLUCOSE 176 (H) 05/25/2018 0420   GLUCOSE 152 (H) 06/14/2014 0543   BUN 11 05/25/2018 0420   BUN 21 (H) 06/14/2014 0543   CREATININE 3.29 (H) 05/25/2018 0420   CREATININE 1.04 06/14/2014 0543   CALCIUM 7.9 (L) 05/25/2018 0420   CALCIUM 8.4 (L) 06/14/2014 0543   GFRNONAA 14 (L) 05/25/2018 0420   GFRNONAA 58 (L) 06/14/2014 0543   GFRNONAA >60 12/10/2013 0407   GFRAA 16 (L) 05/25/2018 0420   GFRAA >60 06/14/2014 0543  GFRAA >60 12/10/2013 0407   CrCl cannot be calculated (Patient's most recent lab result is older than the maximum 21 days allowed.).  COAG Lab Results  Component Value Date   INR 1.04 05/12/2018   INR 1.07 05/11/2018   INR 0.97 05/04/2018    Radiology No results found.  Assessment/Plan 1.  Complication dialysis device with non-functional permcath:  Patient's Tunneled catheter is not being used. The patient has an extremity access that is functioning well. Therefore, the patient will undergo removal of the tunneled catheter under local anesthesia.  The risks and benefits were described to the patient.  All questions were answered.  The patient agrees to proceed with angiography and intervention. Potassium will be drawn to ensure that it is an appropriate level prior to performing intervention. 2.  End-stage renal disease requiring hemodialysis:  Patient will continue dialysis therapy without further interruption  3.  Hypertension:  Patient will continue medical management; nephrology is following no changes in oral medications. 4. Diabetes mellitus:  Glucose will be monitored and oral medications been held this morning once the patient has undergone the patient's procedure po intake will be reinitiated and again Accu-Cheks will be used to assess the blood  glucose level and treat as needed. The patient will be restarted on the patient's usual hypoglycemic regime     Leotis Pain, MD  04/11/2019 11:08 AM

## 2019-04-11 NOTE — Op Note (Signed)
Operative Note     Preoperative diagnosis:   1. ESRD with functional permanent access  Postoperative diagnosis:  1. ESRD with functional permanent access  Procedure:  Removal of right jugular Permcath  Surgeon:  Leotis Pain, MD  Anesthesia:  Local  EBL:  Minimal  Indication for the Procedure:  The patient has a functional permanent dialysis access and no longer needs their permcath.  This can be removed.  Risks and benefits are discussed and informed consent is obtained.  Description of the Procedure:  The patient's right neck, chest and existing catheter were sterilely prepped and draped. The area around the catheter was anesthetized copiously with 1% lidocaine. The catheter was dissected out with curved hemostats until the cuff was freed from the surrounding fibrous sheath. The fiber sheath was transected, and the catheter was then removed in its entirety using gentle traction. Pressure was held and sterile dressings were placed. The patient tolerated the procedure well and was taken to the recovery room in stable condition.     Leotis Pain  04/11/2019, 12:27 PM This note was created with Dragon Medical transcription system. Any errors in dictation are purely unintentional.

## 2019-05-07 ENCOUNTER — Other Ambulatory Visit: Payer: Self-pay | Admitting: Family Medicine

## 2019-05-07 DIAGNOSIS — Z1231 Encounter for screening mammogram for malignant neoplasm of breast: Secondary | ICD-10-CM

## 2019-06-11 ENCOUNTER — Ambulatory Visit
Admission: RE | Admit: 2019-06-11 | Discharge: 2019-06-11 | Disposition: A | Discharge status: Home / Self Care | Payer: 59 | Source: Ambulatory Visit | Attending: Family Medicine | Admitting: Family Medicine

## 2019-06-11 DIAGNOSIS — Z1231 Encounter for screening mammogram for malignant neoplasm of breast: Secondary | ICD-10-CM | POA: Insufficient documentation

## 2019-07-01 ENCOUNTER — Other Ambulatory Visit: Payer: Self-pay | Admitting: Nephrology

## 2019-07-01 ENCOUNTER — Encounter: Payer: Self-pay | Admitting: Nephrology

## 2019-07-01 ENCOUNTER — Other Ambulatory Visit
Admission: RE | Admit: 2019-07-01 | Discharge: 2019-07-01 | Disposition: A | Discharge status: Home / Self Care | Payer: 59 | Source: Ambulatory Visit | Attending: Nephrology | Admitting: Nephrology

## 2019-07-01 DIAGNOSIS — N186 End stage renal disease: Secondary | ICD-10-CM | POA: Insufficient documentation

## 2019-07-01 DIAGNOSIS — E875 Hyperkalemia: Secondary | ICD-10-CM

## 2019-07-01 LAB — POTASSIUM: Potassium: 7.1 mmol/L (ref 3.5–5.1)

## 2019-07-01 NOTE — Progress Notes (Signed)
Notified by nurse that Outpatient lab results from 10/12 show potassium 7.0 Repeat stat lab at [facility] in [place] 16.8 gm daily for patient to he [place]  HTN BP 225/84 Start doxazosin 2 mg daily. If tolerates ok, will increase to 4 mg next week

## 2019-10-28 ENCOUNTER — Other Ambulatory Visit: Payer: Self-pay

## 2019-10-28 ENCOUNTER — Encounter: Payer: Self-pay | Admitting: Emergency Medicine

## 2019-10-28 ENCOUNTER — Emergency Department: Payer: BC Managed Care – PPO

## 2019-10-28 ENCOUNTER — Inpatient Hospital Stay
Admission: EM | Admit: 2019-10-28 | Discharge: 2019-11-01 | DRG: 177 | Disposition: A | Discharge status: Home Health | Payer: BC Managed Care – PPO | Attending: Internal Medicine | Admitting: Internal Medicine

## 2019-10-28 DIAGNOSIS — Z833 Family history of diabetes mellitus: Secondary | ICD-10-CM

## 2019-10-28 DIAGNOSIS — E1151 Type 2 diabetes mellitus with diabetic peripheral angiopathy without gangrene: Secondary | ICD-10-CM | POA: Diagnosis present

## 2019-10-28 DIAGNOSIS — Z9851 Tubal ligation status: Secondary | ICD-10-CM

## 2019-10-28 DIAGNOSIS — Z823 Family history of stroke: Secondary | ICD-10-CM

## 2019-10-28 DIAGNOSIS — U071 COVID-19: Secondary | ICD-10-CM | POA: Diagnosis not present

## 2019-10-28 DIAGNOSIS — Z8269 Family history of other diseases of the musculoskeletal system and connective tissue: Secondary | ICD-10-CM

## 2019-10-28 DIAGNOSIS — N186 End stage renal disease: Secondary | ICD-10-CM

## 2019-10-28 DIAGNOSIS — E871 Hypo-osmolality and hyponatremia: Secondary | ICD-10-CM | POA: Diagnosis not present

## 2019-10-28 DIAGNOSIS — E8729 Other acidosis: Secondary | ICD-10-CM

## 2019-10-28 DIAGNOSIS — R55 Syncope and collapse: Secondary | ICD-10-CM | POA: Diagnosis present

## 2019-10-28 DIAGNOSIS — J1282 Pneumonia due to coronavirus disease 2019: Secondary | ICD-10-CM | POA: Diagnosis present

## 2019-10-28 DIAGNOSIS — E1165 Type 2 diabetes mellitus with hyperglycemia: Secondary | ICD-10-CM | POA: Diagnosis not present

## 2019-10-28 DIAGNOSIS — K5900 Constipation, unspecified: Secondary | ICD-10-CM | POA: Diagnosis not present

## 2019-10-28 DIAGNOSIS — M47812 Spondylosis without myelopathy or radiculopathy, cervical region: Secondary | ICD-10-CM | POA: Diagnosis present

## 2019-10-28 DIAGNOSIS — Z9049 Acquired absence of other specified parts of digestive tract: Secondary | ICD-10-CM

## 2019-10-28 DIAGNOSIS — E1169 Type 2 diabetes mellitus with other specified complication: Secondary | ICD-10-CM | POA: Diagnosis present

## 2019-10-28 DIAGNOSIS — I4581 Long QT syndrome: Secondary | ICD-10-CM | POA: Diagnosis present

## 2019-10-28 DIAGNOSIS — Z9841 Cataract extraction status, right eye: Secondary | ICD-10-CM

## 2019-10-28 DIAGNOSIS — E785 Hyperlipidemia, unspecified: Secondary | ICD-10-CM | POA: Diagnosis present

## 2019-10-28 DIAGNOSIS — Z992 Dependence on renal dialysis: Secondary | ICD-10-CM

## 2019-10-28 DIAGNOSIS — Z79891 Long term (current) use of opiate analgesic: Secondary | ICD-10-CM

## 2019-10-28 DIAGNOSIS — E1122 Type 2 diabetes mellitus with diabetic chronic kidney disease: Secondary | ICD-10-CM | POA: Diagnosis present

## 2019-10-28 DIAGNOSIS — Z89422 Acquired absence of other left toe(s): Secondary | ICD-10-CM

## 2019-10-28 DIAGNOSIS — D631 Anemia in chronic kidney disease: Secondary | ICD-10-CM | POA: Diagnosis present

## 2019-10-28 DIAGNOSIS — Z7902 Long term (current) use of antithrombotics/antiplatelets: Secondary | ICD-10-CM

## 2019-10-28 DIAGNOSIS — E1143 Type 2 diabetes mellitus with diabetic autonomic (poly)neuropathy: Secondary | ICD-10-CM | POA: Diagnosis present

## 2019-10-28 DIAGNOSIS — Z79899 Other long term (current) drug therapy: Secondary | ICD-10-CM

## 2019-10-28 DIAGNOSIS — E872 Acidosis: Secondary | ICD-10-CM | POA: Diagnosis present

## 2019-10-28 DIAGNOSIS — I1311 Hypertensive heart and chronic kidney disease without heart failure, with stage 5 chronic kidney disease, or end stage renal disease: Secondary | ICD-10-CM | POA: Diagnosis present

## 2019-10-28 DIAGNOSIS — Z794 Long term (current) use of insulin: Secondary | ICD-10-CM

## 2019-10-28 DIAGNOSIS — I160 Hypertensive urgency: Secondary | ICD-10-CM | POA: Diagnosis present

## 2019-10-28 DIAGNOSIS — I159 Secondary hypertension, unspecified: Secondary | ICD-10-CM

## 2019-10-28 DIAGNOSIS — E11649 Type 2 diabetes mellitus with hypoglycemia without coma: Secondary | ICD-10-CM | POA: Diagnosis not present

## 2019-10-28 DIAGNOSIS — I951 Orthostatic hypotension: Secondary | ICD-10-CM | POA: Diagnosis not present

## 2019-10-28 DIAGNOSIS — Z9842 Cataract extraction status, left eye: Secondary | ICD-10-CM

## 2019-10-28 DIAGNOSIS — Z882 Allergy status to sulfonamides status: Secondary | ICD-10-CM

## 2019-10-28 DIAGNOSIS — Z888 Allergy status to other drugs, medicaments and biological substances status: Secondary | ICD-10-CM

## 2019-10-28 DIAGNOSIS — N2581 Secondary hyperparathyroidism of renal origin: Secondary | ICD-10-CM | POA: Diagnosis present

## 2019-10-28 DIAGNOSIS — Z7982 Long term (current) use of aspirin: Secondary | ICD-10-CM

## 2019-10-28 DIAGNOSIS — Z8249 Family history of ischemic heart disease and other diseases of the circulatory system: Secondary | ICD-10-CM

## 2019-10-28 DIAGNOSIS — R531 Weakness: Secondary | ICD-10-CM

## 2019-10-28 HISTORY — DX: COVID-19: U07.1

## 2019-10-28 LAB — CBC WITH DIFFERENTIAL/PLATELET
Abs Immature Granulocytes: 0.09 10*3/uL — ABNORMAL HIGH (ref 0.00–0.07)
Basophils Absolute: 0 10*3/uL (ref 0.0–0.1)
Basophils Relative: 0 %
Eosinophils Absolute: 0 10*3/uL (ref 0.0–0.5)
Eosinophils Relative: 0 %
HCT: 32.1 % — ABNORMAL LOW (ref 36.0–46.0)
Hemoglobin: 10.4 g/dL — ABNORMAL LOW (ref 12.0–15.0)
Immature Granulocytes: 1 %
Lymphocytes Relative: 5 %
Lymphs Abs: 0.4 10*3/uL — ABNORMAL LOW (ref 0.7–4.0)
MCH: 29.1 pg (ref 26.0–34.0)
MCHC: 32.4 g/dL (ref 30.0–36.0)
MCV: 89.9 fL (ref 80.0–100.0)
Monocytes Absolute: 0.2 10*3/uL (ref 0.1–1.0)
Monocytes Relative: 2 %
Neutro Abs: 8.8 10*3/uL — ABNORMAL HIGH (ref 1.7–7.7)
Neutrophils Relative %: 92 %
Platelets: 252 10*3/uL (ref 150–400)
RBC: 3.57 MIL/uL — ABNORMAL LOW (ref 3.87–5.11)
RDW: 14.5 % (ref 11.5–15.5)
WBC: 9.6 10*3/uL (ref 4.0–10.5)
nRBC: 0.2 % (ref 0.0–0.2)

## 2019-10-28 LAB — TROPONIN I (HIGH SENSITIVITY): Troponin I (High Sensitivity): 44 ng/L — ABNORMAL HIGH (ref <18)

## 2019-10-28 LAB — COMPREHENSIVE METABOLIC PANEL
ALT: 16 U/L (ref 0–44)
AST: 26 U/L (ref 15–41)
Albumin: 2.6 g/dL — ABNORMAL LOW (ref 3.5–5.0)
Alkaline Phosphatase: 35 U/L — ABNORMAL LOW (ref 38–126)
Anion gap: 26 — ABNORMAL HIGH (ref 5–15)
BUN: 68 mg/dL — ABNORMAL HIGH (ref 8–23)
CO2: 20 mmol/L — ABNORMAL LOW (ref 22–32)
Calcium: 6.6 mg/dL — ABNORMAL LOW (ref 8.9–10.3)
Chloride: 90 mmol/L — ABNORMAL LOW (ref 98–111)
Creatinine, Ser: 20.43 mg/dL — ABNORMAL HIGH (ref 0.44–1.00)
GFR calc Af Amer: 2 mL/min — ABNORMAL LOW (ref >60)
GFR calc non Af Amer: 2 mL/min — ABNORMAL LOW (ref >60)
Glucose, Bld: 108 mg/dL — ABNORMAL HIGH (ref 70–99)
Potassium: 4.7 mmol/L (ref 3.5–5.1)
Sodium: 136 mmol/L (ref 135–145)
Total Bilirubin: 0.9 mg/dL (ref 0.3–1.2)
Total Protein: 6.6 g/dL (ref 6.5–8.1)

## 2019-10-28 LAB — ETHANOL: Alcohol, Ethyl (B): <10 mg/dL (ref <10)

## 2019-10-28 LAB — RESPIRATORY PANEL BY RT PCR (FLU A&B, COVID)
Influenza A by PCR: NEGATIVE
Influenza B by PCR: NEGATIVE
SARS Coronavirus 2 by RT PCR: POSITIVE — AB

## 2019-10-28 MED ORDER — LABETALOL HCL 5 MG/ML IV SOLN
5.0000 mg | Freq: Once | INTRAVENOUS | Status: AC
  Administered 2019-10-28: 5 mg via INTRAVENOUS
  Filled 2019-10-28: qty 4

## 2019-10-28 NOTE — H&P (Signed)
[facility] NAME: Tracy Jennings    MR#:  [id number]  DATE OF BIRTH:  1955/11/28  DATE OF ADMISSION:  10/28/2019  PRIMARY CARE PHYSICIAN: Ricardo Jericho, NP   REQUESTING/REFERRING PHYSICIAN: Marjean Donna, MD CHIEF COMPLAINT:   Chief Complaint  Patient presents with  . Hypertension    HISTORY OF PRESENT ILLNESS:  Tracy Jennings  is a 63 y.o. female with a known history of end-stage renal disease on peritoneal dialysis, hypertension, type diabetes mellitus and peripheral vascular disease, who presented to the emergency room with acute onset of syncope.  She was last seen around 7 PM on 2/15 by her husband who later went to the store and when he came back he found her on the ground.  The patient did not remember how long she was down.  She admits to intermittent diarrhea over the last couple weeks no headache however she has been having dizziness.  She admits to cough occasionally productive of clear sputum with no dyspnea or wheezing.  No fever or chills.  She denied any loss of taste or smell.  She has been feeling mildly fatigued and admitted to mild body aches.  She had a positive exposure to Covid 19 through her sister last week.  Upon presentation to the emergency room, blood pressure was was elevated 218/103 with a pulse of 90 temperature 97.9 and respiratory rate of 18.  Labs revealed a BUN of 68 and creatinine of 20.43 with anion gap of 26 and high-sensitivity troponin I 44 and later 47 with anemia hemoglobin of 10.4 hematocrit 32.1 better than previous levels.  Her influenza antigens came back negative.  COVID-19 PCR came back positive.  Alcohol levels less than 10.EKG showed sinus rhythm with rate of 90 with suspected biatrial enlargement and LVH by voltage criteria with prolonged QT interval with QTC of 541 MS. C-spine CT showed multilevel cervical spondylosis with no acute fractures.  Noncontrasted head CT scan revealed no acute intracranial  normalities.   The patient was given 5 mg of IV labetalol and 10 mg of IV hydralazine as well as 12.5 mg of IV Phenergan.  She will be admitted to an observation medical monitored bed for further evaluation and management. PAST MEDICAL HISTORY:   Past Medical History:  Diagnosis Date  . Anemia 04/2018   low iron. to be started on supplements  . CKD (chronic kidney disease)    Stage IV  . Diabetes mellitus without complication ([place])    type II  . ESRD (end stage renal disease) ([place])   . Heart murmur    followed as a child only  . Hyperlipidemia associated with type 2 diabetes mellitus ([place])   . Hypertension   . Pancreatitis   . Peripheral vascular disease ([place])     PAST SURGICAL HISTORY:   Past Surgical History:  Procedure Laterality Date  . AMPUTATION TOE Left 2013   2nd toe. tip of toe (toe nail was infected)  . AV FISTULA PLACEMENT Left 05/11/2018   Procedure: ARTERIOVENOUS (AV) FISTULA CREATION;  Surgeon: Katha Cabal, MD;  Location: [facility];  Service: Vascular;  Laterality: Left;  . CATARACT EXTRACTION    . CHOLECYSTECTOMY  2014  . COLONOSCOPY    . COLONOSCOPY WITH PROPOFOL N/A 01/10/2018   Procedure: COLONOSCOPY WITH PROPOFOL;  Surgeon: Toledo, Benay Pike, MD;  Location: [facility];  Service: Gastroenterology;  Laterality: N/A;  . DIALYSIS/PERMA CATHETER INSERTION N/A 05/21/2018   Procedure: DIALYSIS/PERMA CATHETER  INSERTION;  Surgeon: Algernon Huxley, MD;  Location: [facility];  Service: Cardiovascular;  Laterality: N/A;  . DIALYSIS/PERMA CATHETER REMOVAL N/A 04/11/2019   Procedure: DIALYSIS/PERMA CATHETER REMOVAL;  Surgeon: Algernon Huxley, MD;  Location: [facility];  Service: Cardiovascular;  Laterality: N/A;  . EYE SURGERY Bilateral 2018   cataract extractions  . THROMBECTOMY W/ EMBOLECTOMY  05/11/2018   Procedure: THROMBECTOMY ARTERIOVENOUS FISTULA;  Surgeon: Katha Cabal, MD;  Location: [facility];  Service: Vascular;;  . TUBAL LIGATION  1984     SOCIAL HISTORY:   Social History   Tobacco Use  . Smoking status: Never Smoker  . Smokeless tobacco: Never Used  Substance Use Topics  . Alcohol use: No    FAMILY HISTORY:   Family History  Problem Relation Age of Onset  . Stroke Mother   . Hypertension Mother   . Gout Mother   . Cancer Father   . Diabetes Sister   . Diabetes Maternal Grandmother   . Diabetes Maternal Grandfather   . Diabetes Paternal Grandmother   . Diabetes Paternal Grandfather     DRUG ALLERGIES:   Allergies  Allergen Reactions  . Hctz [Hydrochlorothiazide]     pancreatitis  . Amlodipine Swelling    Knees down to ankles  . Amlodipine Besylate Swelling  . Sulfa Antibiotics Rash  . Dairy Aid [Lactase]     Runny nose    REVIEW OF SYSTEMS:   ROS As per history of present illness. All pertinent systems were reviewed above. Constitutional,  HEENT, cardiovascular, respiratory, GI, GU, musculoskeletal, neuro, psychiatric, endocrine,  integumentary and hematologic systems were reviewed and are otherwise  negative/unremarkable except for positive findings mentioned above in the HPI.   MEDICATIONS AT HOME:   Prior to Admission medications   Medication Sig Start Date End Date Taking? Authorizing Provider  amLODipine (NORVASC) 5 MG tablet Take 1 tablet by mouth once daily 01/18/16   [provider]  aspirin EC 81 MG tablet Take 81 mg by mouth daily. 09/27/17   [provider]  atorvastatin (LIPITOR) 40 MG tablet Take 40 mg by mouth daily at 6 PM.  10/05/15 07/02/18  [provider]  bumetanide (BUMEX) 1 MG tablet Take 2 mg by mouth 2 (two) times daily.  02/28/18 02/28/19  [provider]  carvedilol (COREG CR) 40 MG 24 hr capsule Take 40 mg by mouth daily. Takes in the morning when she gets home from work 11/06/17   [provider]  clopidogrel (PLAVIX) 75 MG tablet Take 75 mg by mouth daily.    [provider]  diltiazem (CARDIZEM LA) 120 MG 24 hr  tablet Take 1 tablet (120 mg total) by mouth daily. Patient taking differently: Take 120 mg by mouth daily. Takes in the morning when she gets home from work 08/08/17   Cuthriell, Charline Bills, PA-C  diltiazem (TIAZAC) 120 MG 24 hr capsule TK 1 C PO QD 06/22/18   [provider]  fluticasone (FLONASE) 50 MCG/ACT nasal spray 2 sprays by Each Nare route daily. 01/15/18 01/15/19  [provider]  glucose blood (PRECISION QID TEST) test strip Use once daily One Touch Ultra Blue 09/27/17   [provider]  hydrALAZINE (APRESOLINE) 50 MG tablet Take 50 mg by mouth every 8 (eight) hours.  10/24/17   [provider]  HYDROcodone-acetaminophen (NORCO) 5-325 MG tablet Take 1-2 tablets by mouth every 6 (six) hours as needed for moderate pain or severe pain. 05/11/18  Schnier, Dolores Lory, MD  insulin aspart (NOVOLOG) 100 UNIT/ML FlexPen Inject 10-12 Units into the skin 3 (three) times daily with meals.  12/28/15   [provider]  Insulin Glargine 300 UNIT/ML SOPN Inject 40 Units into the skin daily. In the morning.  Nelva Nay) 11/30/15   [provider]  losartan (COZAAR) 25 MG tablet Take 1 tablet (25 mg total) by mouth daily. 05/25/18 07/02/18  Saundra Shelling, MD  ondansetron (ZOFRAN) 4 MG tablet Take 1 tablet (4 mg total) by mouth every 6 (six) hours as needed for nausea. 11/03/17   Epifanio Lesches, MD  torsemide (DEMADEX) 20 MG tablet Take 2 tablets (40 mg total) by mouth daily. 05/25/18 06/24/18  Saundra Shelling, MD  Vitamin D, Ergocalciferol, (DRISDOL) 50000 units CAPS capsule TAKE ONE CAPSULE BY MOUTH ONE TIME PER WEEK 01/06/18   [provider]      VITAL SIGNS:  Blood pressure (!) 207/85, pulse 85, temperature 97.9 F (36.6 C), temperature source Oral, resp. rate (!) 21, height 5\' 3"  (1.6 m), weight 68 kg, SpO2 96 %.  PHYSICAL EXAMINATION:  Physical Exam  GENERAL:  64 y.o.-year-old African-American female patient lying in the bed with no acute  distress.  EYES: Pupils equal, round, reactive to light and accommodation. No scleral icterus. Extraocular muscles intact.  HEENT: Head atraumatic, normocephalic. Oropharynx and nasopharynx clear.  NECK:  Supple, no jugular venous distention. No thyroid enlargement, no tenderness.  LUNGS: Minimally diminished bibasal breath sounds with minimal bibasal crackles. CARDIOVASCULAR: Regular rate and rhythm, S1, S2 normal. No murmurs, rubs, or gallops.  ABDOMEN: Soft, nondistended, nontender. Bowel sounds present. No organomegaly or mass.  EXTREMITIES: No pedal edema, cyanosis, or clubbing.  NEUROLOGIC: Cranial nerves II through XII are intact. Muscle strength 5/5 in all extremities. Sensation intact. Gait not checked.  PSYCHIATRIC: The patient is alert and oriented x 3.  Normal affect and good eye contact. SKIN: No obvious rash, lesion, or ulcer.   LABORATORY PANEL:   CBC Recent Labs  Lab 10/28/19 2055  WBC 9.6  HGB 10.4*  HCT 32.1*  PLT 252   ------------------------------------------------------------------------------------------------------------------  Chemistries  Recent Labs  Lab 10/28/19 2055  NA 136  K 4.7  CL 90*  CO2 20*  GLUCOSE 108*  BUN 68*  CREATININE 20.43*  CALCIUM 6.6*  AST 26  ALT 16  ALKPHOS 35*  BILITOT 0.9   ------------------------------------------------------------------------------------------------------------------  Cardiac Enzymes No results for input(s): TROPONINI in the last 168 hours. ------------------------------------------------------------------------------------------------------------------  RADIOLOGY:  CT Head Wo Contrast  Result Date: 10/28/2019 CLINICAL DATA:  [place], altered level of consciousness, headache EXAM: CT HEAD WITHOUT CONTRAST TECHNIQUE: Contiguous axial images were obtained from the base of the skull through the vertex without intravenous contrast. COMPARISON:  05/16/2018 FINDINGS: Brain: No acute infarct or hemorrhage.  Lateral ventricles and midline structures appear unremarkable. No acute extra-axial fluid collections. No mass effect. Vascular: No hyperdense vessel or unexpected calcification. Skull: Normal. Negative for fracture or focal lesion. Sinuses/Orbits: No acute finding. Other: None IMPRESSION: 1. Stable head CT, no acute process. Electronically Signed   By: Randa Ngo M.D.   On: 10/28/2019 21:26   CT Cervical Spine Wo Contrast  Result Date: 10/28/2019 CLINICAL DATA:  [place], altered level of consciousness EXAM: CT CERVICAL SPINE WITHOUT CONTRAST TECHNIQUE: Multidetector CT imaging of the cervical spine was performed without intravenous contrast. Multiplanar CT image reconstructions were also generated. COMPARISON:  None. FINDINGS: Alignment: Cervical vertebral bodies are in grossly anatomic alignment. Skull base and vertebrae: There are  no acute displaced fractures. Soft tissues and spinal canal: No prevertebral fluid or swelling. No visible canal hematoma. Disc levels: Multilevel cervical spondylosis is identified, most pronounced at the C5-6, C6-7, and C7/T1 levels. There is disc space narrowing, circumferential disc osteophyte complex, and uncovertebral hypertrophy. Mild symmetrical neural foraminal encroachment at C5/C6. Upper chest: Central airways patent. Scattered pleural and parenchymal scarring at the lung apices. Other: Reconstructed images demonstrate no additional findings. IMPRESSION: 1. Multilevel cervical spondylosis.  No acute fractures. Electronically Signed   By: Randa Ngo M.D.   On: 10/28/2019 21:28      IMPRESSION AND PLAN:   1.  Syncope. The patient will be admitted to an observation medically monitored bed.  Will check orthostatics q 12 hours.  Will obtain a bilateral carotid Doppler and 2D echo.  The patient will be gently hydrated with IV normal saline and monitored for arrhythmias. Differential diagnosis would include neurally mediated syncope, cardiogenic, arrhythmias  related,  orthostatic hypotension and less likely hypoglycemia.  2.  Hypertensive urgency.  The patient will be placed on as needed IV labetalol and continued on her antihypertensives.  3.  End-stage renal disease on peritoneal dialysis.  Nephrology consultation will be obtained for follow-up.  Dr. Anthonette Legato was notified.  4.  COVID-19.  She currently has normal pulse oximetry.  She has been having symptoms including weakness, fatigue, body aches and diarrhea as well as cough.  We will place her on IV remdesivir.  Will obtain inflammatory markers to consider steroid therapy.  5.  Type 2 diabetes mellitus.  She will be placed on supplement coverage with NovoLog and will continue basal coverage.  6.  DVT prophylaxis.  Subtenons heparin.   All the records are reviewed and case discussed with ED provider. The plan of care was discussed in details with the patient (and family). I answered all questions. The patient agreed to proceed with the above mentioned plan. Further management will depend upon hospital course.   CODE STATUS: Full code  TOTAL TIME TAKING CARE OF THIS PATIENT: 55 minutes.    Christel Mormon M.D on 10/28/2019 at 11:54 PM  [facility]   From 7 PM-7 AM, contact night-coverage [email or web address]  CC: Primary care physician; White, Orlene Och, NP   Note: This dictation was prepared with Dragon dictation along with smaller phrase technology. Any transcriptional errors that result from this process are unintentional.

## 2019-10-28 NOTE — ED Notes (Signed)
Pt Bp at 2142 was 195/87, MD Jari Pigg notified via secure chat.

## 2019-10-28 NOTE — ED Notes (Signed)
Pt is in CT, will draw labs when pt returns

## 2019-10-28 NOTE — ED Triage Notes (Signed)
Pt arrived via [facility] from home, pt husband reported that pt experienced a fall along with altered mental status. EMS reports that pt was oriented to person however disoriented to time, place, and situation. EMS reports increased BP en route. Upon arrival pt was alert and oriented x4, and BP was 218/103, MD made aware.

## 2019-10-28 NOTE — ED Provider Notes (Signed)
[facility] Emergency Department Provider Note  ____________________________________________   First MD Initiated Contact with Patient 10/28/19 2053     (approximate)  I have reviewed the triage vital signs and the nursing notes.   HISTORY  Chief Complaint Hypertension    HPI Tracy Jennings is a 64 y.o. female with CKD, diabetes, ESRD on peritoneal dialysis, hypertension, hyperlipidemia who comes in for fall and hypertension.  Patient was last seen 2 hours ago around 7 PM on 2/15 before husband went to the store.  When he got back she was on the ground.  Patient states she does not remember what happened.  Patient noted be hypertensive by EMS.  Patient denies any symptoms at this time.  Patient denies any chest pain, shortness of breath, abdominal pain.  Patient did have a recent Covid contact.  Initially with EMS she was also very confused and unable to have a conversation.  According to husband this was very different from baseline.  EMS was concerned that she was very altered but upon my assessment she seems to be improving.          Past Medical History:  Diagnosis Date  . Anemia 04/2018   low iron. to be started on supplements  . CKD (chronic kidney disease)    Stage IV  . Diabetes mellitus without complication ([place])    type II  . ESRD (end stage renal disease) ([place])   . Heart murmur    followed as a child only  . Hyperlipidemia associated with type 2 diabetes mellitus ([place])   . Hypertension   . Pancreatitis   . Peripheral vascular disease [facility])     Patient Active Problem List   Diagnosis Date Noted  . 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 Complication of vascular access for dialysis 05/11/2018  . Respiratory arrest ([place]) 05/11/2018  . Hyperlipidemia 04/12/2018  . Pancreatitis, recurrent 11/01/2017  . Diabetes ([place]) 11/01/2017  . HTN (hypertension) 11/01/2017  . End stage renal disease ([place]) 11/01/2017  .  Recurrent pancreatitis 11/01/2017    Past Surgical History:  Procedure Laterality Date  . AMPUTATION TOE Left 2013   2nd toe. tip of toe (toe nail was infected)  . AV FISTULA PLACEMENT Left 05/11/2018   Procedure: ARTERIOVENOUS (AV) FISTULA CREATION;  Surgeon: Katha Cabal, MD;  Location: [facility] ORS;  Service: Vascular;  Laterality: Left;  . CATARACT EXTRACTION    . CHOLECYSTECTOMY  2014  . COLONOSCOPY    . COLONOSCOPY WITH PROPOFOL N/A 01/10/2018   Procedure: COLONOSCOPY WITH PROPOFOL;  Surgeon: Toledo, Benay Pike, MD;  Location: [facility] ENDOSCOPY;  Service: Gastroenterology;  Laterality: N/A;  . DIALYSIS/PERMA CATHETER INSERTION N/A 05/21/2018   Procedure: DIALYSIS/PERMA CATHETER INSERTION;  Surgeon: Algernon Huxley, MD;  Location: [place] CV LAB;  Service: Cardiovascular;  Laterality: N/A;  . DIALYSIS/PERMA CATHETER REMOVAL N/A 04/11/2019   Procedure: DIALYSIS/PERMA CATHETER REMOVAL;  Surgeon: Algernon Huxley, MD;  Location: [place] CV LAB;  Service: Cardiovascular;  Laterality: N/A;  . EYE SURGERY Bilateral 2018   cataract extractions  . THROMBECTOMY W/ EMBOLECTOMY  05/11/2018   Procedure: THROMBECTOMY ARTERIOVENOUS FISTULA;  Surgeon: Katha Cabal, MD;  Location: [facility] ORS;  Service: Vascular;;  . TUBAL LIGATION  1984    Prior to Admission medications   Medication Sig Start Date End Date Taking? Authorizing Provider  amLODipine (NORVASC) 5 MG tablet Take 1 tablet by mouth once daily 01/18/16   [provider]  aspirin EC 81  MG tablet Take 81 mg by mouth daily. 09/27/17   [provider]  atorvastatin (LIPITOR) 40 MG tablet Take 40 mg by mouth daily at 6 PM.  10/05/15 07/02/18  [provider]  bumetanide (BUMEX) 1 MG tablet Take 2 mg by mouth 2 (two) times daily.  02/28/18 02/28/19  [provider]  carvedilol (COREG CR) 40 MG 24 hr capsule Take 40 mg by mouth daily. Takes in the morning when she gets home from work 11/06/17   [provider]  clopidogrel (PLAVIX) 75 MG tablet Take 75 mg by mouth daily.    [provider]  diltiazem (CARDIZEM LA) 120 MG 24 hr tablet Take 1 tablet (120 mg total) by mouth daily. Patient taking differently: Take 120 mg by mouth daily. Takes in the morning when she gets home from work 08/08/17   Cuthriell, Charline Bills, PA-C  diltiazem (TIAZAC) 120 MG 24 hr capsule TK 1 C PO QD 06/22/18   [provider]  fluticasone (FLONASE) 50 MCG/ACT nasal spray 2 sprays by Each Nare route daily. 01/15/18 01/15/19  [provider]  glucose blood (PRECISION QID TEST) test strip Use once daily One Touch Ultra Blue 09/27/17   [provider]  hydrALAZINE (APRESOLINE) 50 MG tablet Take 50 mg by mouth every 8 (eight) hours.  10/24/17   [provider]  HYDROcodone-acetaminophen (NORCO) 5-325 MG tablet Take 1-2 tablets by mouth every 6 (six) hours as needed for moderate pain or severe pain. 05/11/18   Schnier, Dolores Lory, MD  insulin aspart (NOVOLOG) 100 UNIT/ML FlexPen Inject 10-12 Units into the skin 3 (three) times daily with meals.  12/28/15   [provider]  Insulin Glargine 300 UNIT/ML SOPN Inject 40 Units into the skin daily. In the morning.  Nelva Nay) 11/30/15   [provider]  losartan (COZAAR) 25 MG tablet Take 1 tablet (25 mg total) by mouth daily. 05/25/18 07/02/18  Saundra Shelling, MD  ondansetron (ZOFRAN) 4 MG tablet Take 1 tablet (4 mg total) by mouth every 6 (six) hours as needed for nausea. 11/03/17   Epifanio Lesches, MD  torsemide (DEMADEX) 20 MG tablet Take 2 tablets (40 mg total) by mouth daily. 05/25/18 06/24/18  Saundra Shelling, MD  Vitamin D, Ergocalciferol, (DRISDOL) 50000 units CAPS capsule TAKE ONE CAPSULE BY MOUTH ONE TIME PER WEEK 01/06/18   [provider]    Allergies Hctz [hydrochlorothiazide], Amlodipine, Amlodipine besylate, Sulfa antibiotics, and Dairy aid [lactase]  Family History  Problem Relation Age of Onset  . Stroke  Mother   . Hypertension Mother   . Gout Mother   . Cancer Father   . Diabetes Sister   . Diabetes Maternal Grandmother   . Diabetes Maternal Grandfather   . Diabetes Paternal Grandmother   . Diabetes Paternal Grandfather     Social History Social History   Tobacco Use  . Smoking status: Never Smoker  . Smokeless tobacco: Never Used  Substance Use Topics  . Alcohol use: No  . Drug use: Never      Review of Systems Constitutional: No fever/chills Eyes: No visual changes. ENT: No sore throat. Cardiovascular: Denies chest pain. Respiratory: Denies shortness of breath. Gastrointestinal: No abdominal pain.  No nausea, no vomiting.  No diarrhea.  No constipation. Genitourinary: Negative for dysuria. Musculoskeletal: Negative for back pain. Skin: Negative for rash. Neurological: Possible syncope, altered mental status, hypertension All other ROS negative ____________________________________________   PHYSICAL EXAM:  VITAL SIGNS: Blood pressure (!) 218/103, pulse 90,  temperature 97.9 F (36.6 C), temperature source Oral, resp. rate 18, height 5\' 3"  (1.6 m), weight 68 kg, SpO2 97 %.   Constitutional: Alert and oriented x3. Eyes: Conjunctivae are normal. EOMI. Head: Atraumatic. Nose: No congestion/rhinnorhea. Mouth/Throat: Mucous membranes are moist.   Neck: No stridor. Trachea Midline. FROM Cardiovascular: Normal rate, regular rhythm. Grossly normal heart sounds.  Good peripheral circulation. Respiratory: Normal respiratory effort.  No retractions. Lungs CTAB. Gastrointestinal: Soft and nontender. No distention. No abdominal bruits.  Peritoneal catheter on her stomach Musculoskeletal: No lower extremity tenderness nor edema.  No joint effusions. Neurologic:  Normal speech and language. No gross focal neurologic deficits are appreciated.  Patient is moving all of her extremities.  No focal deficits. Skin:  Skin is warm, dry and intact. No rash noted. Psychiatric: Mood  and affect are normal. Speech and behavior are normal. GU: Deferred   ____________________________________________   LABS (all labs ordered are listed, but only abnormal results are displayed)  Labs Reviewed  RESPIRATORY PANEL BY RT PCR (FLU A&B, COVID) - Abnormal; Notable for the following components:      Result Value   SARS Coronavirus 2 by RT PCR POSITIVE (*)    All other components within normal limits  CBC WITH DIFFERENTIAL/PLATELET - Abnormal; Notable for the following components:   RBC 3.57 (*)    Hemoglobin 10.4 (*)    HCT 32.1 (*)    Neutro Abs 8.8 (*)    Lymphs Abs 0.4 (*)    Abs Immature Granulocytes 0.09 (*)    All other components within normal limits  COMPREHENSIVE METABOLIC PANEL - Abnormal; Notable for the following components:   Chloride 90 (*)    CO2 20 (*)    Glucose, Bld 108 (*)    BUN 68 (*)    Creatinine, Ser 20.43 (*)    Calcium 6.6 (*)    Albumin 2.6 (*)    Alkaline Phosphatase 35 (*)    GFR calc non Af Amer 2 (*)    GFR calc Af Amer 2 (*)    Anion gap 26 (*)    All other components within normal limits  TROPONIN I (HIGH SENSITIVITY) - Abnormal; Notable for the following components:   Troponin I (High Sensitivity) 44 (*)    All other components within normal limits  ETHANOL  URINE DRUG SCREEN, QUALITATIVE ([facility] ONLY)  URINALYSIS, ROUTINE W REFLEX MICROSCOPIC  TROPONIN I (HIGH SENSITIVITY)   ____________________________________________   ED ECG REPORT I, Vanessa Raymond, the attending physician, personally viewed and interpreted this ECG.  EKG is normal sinus rate of 90, no ST elevation, T wave inversion in lead III, QTC of 541 ____________________________________________  RADIOLOGY   Official radiology report(s): CT Head Wo Contrast  Result Date: 10/28/2019 CLINICAL DATA:  [place], altered level of consciousness, headache EXAM: CT HEAD WITHOUT CONTRAST TECHNIQUE: Contiguous axial images were obtained from the base of the skull through the  vertex without intravenous contrast. COMPARISON:  05/16/2018 FINDINGS: Brain: No acute infarct or hemorrhage. Lateral ventricles and midline structures appear unremarkable. No acute extra-axial fluid collections. No mass effect. Vascular: No hyperdense vessel or unexpected calcification. Skull: Normal. Negative for fracture or focal lesion. Sinuses/Orbits: No acute finding. Other: None IMPRESSION: 1. Stable head CT, no acute process. Electronically Signed   By: Randa Ngo M.D.   On: 10/28/2019 21:26   CT Cervical Spine Wo Contrast  Result Date: 10/28/2019 CLINICAL DATA:  [place], altered level of consciousness EXAM: CT CERVICAL SPINE WITHOUT CONTRAST TECHNIQUE:  Multidetector CT imaging of the cervical spine was performed without intravenous contrast. Multiplanar CT image reconstructions were also generated. COMPARISON:  None. FINDINGS: Alignment: Cervical vertebral bodies are in grossly anatomic alignment. Skull base and vertebrae: There are no acute displaced fractures. Soft tissues and spinal canal: No prevertebral fluid or swelling. No visible canal hematoma. Disc levels: Multilevel cervical spondylosis is identified, most pronounced at the C5-6, C6-7, and C7/T1 levels. There is disc space narrowing, circumferential disc osteophyte complex, and uncovertebral hypertrophy. Mild symmetrical neural foraminal encroachment at C5/C6. Upper chest: Central airways patent. Scattered pleural and parenchymal scarring at the lung apices. Other: Reconstructed images demonstrate no additional findings. IMPRESSION: 1. Multilevel cervical spondylosis.  No acute fractures. Electronically Signed   By: Randa Ngo M.D.   On: 10/28/2019 21:28    ____________________________________________   PROCEDURES  Procedure(s) performed (including Critical Care):  Procedures   ____________________________________________   INITIAL IMPRESSION / ASSESSMENT AND PLAN / ED COURSE  Tracy Jennings was evaluated in Emergency  Department on 10/28/2019 for the symptoms described in the history of present illness. She was evaluated in the context of the global COVID-19 pandemic, which necessitated consideration that the patient might be at risk for infection with the SARS-CoV-2 virus that causes COVID-19. Institutional protocols and algorithms that pertain to the evaluation of patients at risk for COVID-19 are in a state of rapid change based on information released by regulatory bodies including the CDC and federal and state organizations. These policies and algorithms were followed during the patient's care in the ED.    Patient is a 64 year old who presents with possible syncope versus fall and concussion.  Patient denies any symptoms at this time.  Will get CT head to evaluate for intracranial hemorrhage.  Will get CT cervical evaluate for cervical fracture.  Initially with EMS patient was not very responsive but now patient is alert and oriented x3 moving all extremities without any obvious cranial nerve deficits.  Will get stat CT head.  Will get labs to evaluate for electrolyte abnormalities, AKI, UTI.  CT imaging was negative.  Troponin slightly elevated.  Dialysis labs are elevated with anion gap elevation.  Patient will need peritoneal dialysis.  Discussed with Dr. Holley Raring he will give dialysis tomorrow.  Reevaluated patient.  Cranial nerves still appear intact and moving all extremities.  Discussed with patient admission for syncopal work-up.  Patient is Covid positive but given she is on peritoneal dialysis she will have to stay here.   ____________________________________________   FINAL CLINICAL IMPRESSION(S) / ED DIAGNOSES   Final diagnoses:  Secondary hypertension  Metabolic acidosis, increased anion gap (IAG)  Syncope, unspecified syncope type  ESRD (end stage renal disease) ([place])  COVID-19      MEDICATIONS GIVEN DURING THIS VISIT:  Medications  labetalol (NORMODYNE) injection 5 mg (5 mg  Intravenous Given 10/28/19 2314)     ED Discharge Orders    None       Note:  This document was prepared using Dragon voice recognition software and may include unintentional dictation errors.   Vanessa Ledyard, MD 10/28/19 2337

## 2019-10-28 NOTE — ED Notes (Signed)
Date and time results received: 10/28/19 2248 (use smartphrase ".now" to insert current time)  Test: Coronavirus   Critical Value: Positive   Name of Provider Notified: Jari Pigg  Orders Received? Or Actions Taken?: Actions Taken: No new orders given at this time

## 2019-10-29 ENCOUNTER — Inpatient Hospital Stay: Payer: BC Managed Care – PPO

## 2019-10-29 ENCOUNTER — Inpatient Hospital Stay
Admit: 2019-10-29 | Discharge: 2019-10-29 | Disposition: A | Discharge status: Home / Self Care | Payer: BC Managed Care – PPO | Attending: Family Medicine | Admitting: Family Medicine

## 2019-10-29 DIAGNOSIS — G903 Multi-system degeneration of the autonomic nervous system: Secondary | ICD-10-CM | POA: Diagnosis not present

## 2019-10-29 DIAGNOSIS — Z89422 Acquired absence of other left toe(s): Secondary | ICD-10-CM | POA: Diagnosis not present

## 2019-10-29 DIAGNOSIS — R55 Syncope and collapse: Secondary | ICD-10-CM

## 2019-10-29 DIAGNOSIS — E872 Acidosis: Secondary | ICD-10-CM | POA: Diagnosis present

## 2019-10-29 DIAGNOSIS — R531 Weakness: Secondary | ICD-10-CM | POA: Diagnosis not present

## 2019-10-29 DIAGNOSIS — I160 Hypertensive urgency: Secondary | ICD-10-CM | POA: Diagnosis present

## 2019-10-29 DIAGNOSIS — M47812 Spondylosis without myelopathy or radiculopathy, cervical region: Secondary | ICD-10-CM | POA: Diagnosis present

## 2019-10-29 DIAGNOSIS — K5900 Constipation, unspecified: Secondary | ICD-10-CM | POA: Diagnosis not present

## 2019-10-29 DIAGNOSIS — E1143 Type 2 diabetes mellitus with diabetic autonomic (poly)neuropathy: Secondary | ICD-10-CM | POA: Diagnosis present

## 2019-10-29 DIAGNOSIS — E1165 Type 2 diabetes mellitus with hyperglycemia: Secondary | ICD-10-CM | POA: Diagnosis not present

## 2019-10-29 DIAGNOSIS — D631 Anemia in chronic kidney disease: Secondary | ICD-10-CM | POA: Diagnosis present

## 2019-10-29 DIAGNOSIS — Z992 Dependence on renal dialysis: Secondary | ICD-10-CM

## 2019-10-29 DIAGNOSIS — U071 COVID-19: Secondary | ICD-10-CM | POA: Diagnosis present

## 2019-10-29 DIAGNOSIS — I953 Hypotension of hemodialysis: Secondary | ICD-10-CM | POA: Diagnosis not present

## 2019-10-29 DIAGNOSIS — E1169 Type 2 diabetes mellitus with other specified complication: Secondary | ICD-10-CM | POA: Diagnosis present

## 2019-10-29 DIAGNOSIS — I739 Peripheral vascular disease, unspecified: Secondary | ICD-10-CM | POA: Diagnosis not present

## 2019-10-29 DIAGNOSIS — E1122 Type 2 diabetes mellitus with diabetic chronic kidney disease: Secondary | ICD-10-CM | POA: Diagnosis present

## 2019-10-29 DIAGNOSIS — E1151 Type 2 diabetes mellitus with diabetic peripheral angiopathy without gangrene: Secondary | ICD-10-CM | POA: Diagnosis present

## 2019-10-29 DIAGNOSIS — I951 Orthostatic hypotension: Secondary | ICD-10-CM | POA: Diagnosis not present

## 2019-10-29 DIAGNOSIS — E11649 Type 2 diabetes mellitus with hypoglycemia without coma: Secondary | ICD-10-CM | POA: Diagnosis not present

## 2019-10-29 DIAGNOSIS — E785 Hyperlipidemia, unspecified: Secondary | ICD-10-CM | POA: Diagnosis present

## 2019-10-29 DIAGNOSIS — N186 End stage renal disease: Secondary | ICD-10-CM | POA: Diagnosis present

## 2019-10-29 DIAGNOSIS — J1282 Pneumonia due to coronavirus disease 2019: Secondary | ICD-10-CM | POA: Diagnosis present

## 2019-10-29 DIAGNOSIS — I159 Secondary hypertension, unspecified: Secondary | ICD-10-CM | POA: Diagnosis not present

## 2019-10-29 DIAGNOSIS — I10 Essential (primary) hypertension: Secondary | ICD-10-CM | POA: Diagnosis not present

## 2019-10-29 DIAGNOSIS — I1311 Hypertensive heart and chronic kidney disease without heart failure, with stage 5 chronic kidney disease, or end stage renal disease: Secondary | ICD-10-CM | POA: Diagnosis present

## 2019-10-29 DIAGNOSIS — E871 Hypo-osmolality and hyponatremia: Secondary | ICD-10-CM | POA: Diagnosis not present

## 2019-10-29 DIAGNOSIS — Z789 Other specified health status: Secondary | ICD-10-CM | POA: Diagnosis not present

## 2019-10-29 DIAGNOSIS — I4581 Long QT syndrome: Secondary | ICD-10-CM | POA: Diagnosis present

## 2019-10-29 DIAGNOSIS — N2581 Secondary hyperparathyroidism of renal origin: Secondary | ICD-10-CM | POA: Diagnosis present

## 2019-10-29 LAB — GLUCOSE, CAPILLARY
Glucose-Capillary: 50 mg/dL — ABNORMAL LOW (ref 70–99)
Glucose-Capillary: 70 mg/dL (ref 70–99)
Glucose-Capillary: 109 mg/dL — ABNORMAL HIGH (ref 70–99)
Glucose-Capillary: 142 mg/dL — ABNORMAL HIGH (ref 70–99)
Glucose-Capillary: 66 mg/dL — ABNORMAL LOW (ref 70–99)
Glucose-Capillary: 66 mg/dL — ABNORMAL LOW (ref 70–99)

## 2019-10-29 LAB — CBC
HCT: 30.2 % — ABNORMAL LOW (ref 36.0–46.0)
Hemoglobin: 9.9 g/dL — ABNORMAL LOW (ref 12.0–15.0)
MCH: 29.3 pg (ref 26.0–34.0)
MCHC: 32.8 g/dL (ref 30.0–36.0)
MCV: 89.3 fL (ref 80.0–100.0)
Platelets: 267 10*3/uL (ref 150–400)
RBC: 3.38 MIL/uL — ABNORMAL LOW (ref 3.87–5.11)
RDW: 14.5 % (ref 11.5–15.5)
WBC: 8.1 10*3/uL (ref 4.0–10.5)
nRBC: 0.4 % — ABNORMAL HIGH (ref 0.0–0.2)

## 2019-10-29 LAB — COMPREHENSIVE METABOLIC PANEL
ALT: 13 U/L (ref 0–44)
AST: 24 U/L (ref 15–41)
Albumin: 2.7 g/dL — ABNORMAL LOW (ref 3.5–5.0)
Alkaline Phosphatase: 31 U/L — ABNORMAL LOW (ref 38–126)
Anion gap: 29 — ABNORMAL HIGH (ref 5–15)
BUN: 75 mg/dL — ABNORMAL HIGH (ref 8–23)
CO2: 18 mmol/L — ABNORMAL LOW (ref 22–32)
Calcium: 6.3 mg/dL — CL (ref 8.9–10.3)
Chloride: 88 mmol/L — ABNORMAL LOW (ref 98–111)
Creatinine, Ser: 20.72 mg/dL — ABNORMAL HIGH (ref 0.44–1.00)
GFR calc Af Amer: 2 mL/min — ABNORMAL LOW (ref >60)
GFR calc non Af Amer: 2 mL/min — ABNORMAL LOW (ref >60)
Glucose, Bld: 58 mg/dL — ABNORMAL LOW (ref 70–99)
Potassium: 4.2 mmol/L (ref 3.5–5.1)
Sodium: 135 mmol/L (ref 135–145)
Total Bilirubin: 0.9 mg/dL (ref 0.3–1.2)
Total Protein: 6.6 g/dL (ref 6.5–8.1)

## 2019-10-29 LAB — HEMOGLOBIN A1C
Hgb A1c MFr Bld: 5.9 % — ABNORMAL HIGH (ref 4.8–5.6)
Mean Plasma Glucose: 122.63 mg/dL

## 2019-10-29 LAB — ECHOCARDIOGRAM COMPLETE
Height: 63 in
Weight: 2400 oz

## 2019-10-29 LAB — FIBRIN DERIVATIVES D-DIMER (ARMC ONLY): Fibrin derivatives D-dimer (ARMC): 2248.77 ng/mL (FEU) — ABNORMAL HIGH (ref 0.00–499.00)

## 2019-10-29 LAB — HIV ANTIBODY (ROUTINE TESTING W REFLEX): HIV Screen 4th Generation wRfx: NONREACTIVE

## 2019-10-29 LAB — FERRITIN: Ferritin: 760 ng/mL — ABNORMAL HIGH (ref 11–307)

## 2019-10-29 LAB — LACTATE DEHYDROGENASE: LDH: 450 U/L — ABNORMAL HIGH (ref 98–192)

## 2019-10-29 LAB — TROPONIN I (HIGH SENSITIVITY): Troponin I (High Sensitivity): 47 ng/L — ABNORMAL HIGH (ref <18)

## 2019-10-29 LAB — C-REACTIVE PROTEIN: CRP: 5.8 mg/dL — ABNORMAL HIGH (ref <1.0)

## 2019-10-29 MED ORDER — LABETALOL HCL 5 MG/ML IV SOLN
20.0000 mg | INTRAVENOUS | Status: DC | PRN
  Administered 2019-10-29 – 2019-10-31 (×3): 20 mg via INTRAVENOUS
  Filled 2019-10-29 (×3): qty 4

## 2019-10-29 MED ORDER — ONDANSETRON HCL 4 MG PO TABS: 4.0000 mg | ORAL_TABLET | Freq: Four times a day (QID) | ORAL | Status: DC | PRN

## 2019-10-29 MED ORDER — LABETALOL HCL 5 MG/ML IV SOLN
20.0000 mg | INTRAVENOUS | Status: DC | PRN
  Administered 2019-10-29 (×2): 20 mg via INTRAVENOUS
  Filled 2019-10-29 (×2): qty 4

## 2019-10-29 MED ORDER — LOSARTAN POTASSIUM 50 MG PO TABS
25.0000 mg | ORAL_TABLET | Freq: Every day | ORAL | Status: DC
  Administered 2019-10-29: 25 mg via ORAL
  Filled 2019-10-29: qty 1

## 2019-10-29 MED ORDER — FLUTICASONE PROPIONATE 50 MCG/ACT NA SUSP
2.0000 | Freq: Every day | NASAL | Status: DC
  Administered 2019-10-29 – 2019-10-30 (×2): 2 via NASAL
  Filled 2019-10-29: qty 16

## 2019-10-29 MED ORDER — VITAMIN D (ERGOCALCIFEROL) 1.25 MG (50000 UNIT) PO CAPS
50000.0000 [IU] | ORAL_CAPSULE | ORAL | Status: DC
  Administered 2019-10-29: 09:00:00 50000 [IU] via ORAL
  Filled 2019-10-29: qty 1

## 2019-10-29 MED ORDER — SODIUM CHLORIDE 0.9 % IV SOLN
100.0000 mg | Freq: Every day | INTRAVENOUS | Status: DC
  Administered 2019-10-30 – 2019-11-01 (×3): 100 mg via INTRAVENOUS
  Filled 2019-10-29 (×3): qty 20

## 2019-10-29 MED ORDER — CALCITRIOL 0.25 MCG PO CAPS
0.2500 ug | ORAL_CAPSULE | Freq: Every day | ORAL | Status: DC
  Administered 2019-10-29 – 2019-11-01 (×4): 0.25 ug via ORAL
  Filled 2019-10-29 (×5): qty 1

## 2019-10-29 MED ORDER — CLOPIDOGREL BISULFATE 75 MG PO TABS
75.0000 mg | ORAL_TABLET | Freq: Every day | ORAL | Status: DC
  Administered 2019-10-29 – 2019-11-01 (×4): 75 mg via ORAL
  Filled 2019-10-29 (×4): qty 1

## 2019-10-29 MED ORDER — CALCIUM ACETATE (PHOS BINDER) 667 MG PO CAPS
1334.0000 mg | ORAL_CAPSULE | Freq: Three times a day (TID) | ORAL | Status: DC
  Administered 2019-10-29 – 2019-11-01 (×9): 1334 mg via ORAL
  Filled 2019-10-29 (×12): qty 2

## 2019-10-29 MED ORDER — ONDANSETRON HCL 4 MG/2ML IJ SOLN: 4.0000 mg | Freq: Four times a day (QID) | INTRAMUSCULAR | Status: DC | PRN

## 2019-10-29 MED ORDER — INSULIN GLARGINE 100 UNIT/ML ~~LOC~~ SOLN
20.0000 [IU] | Freq: Every day | SUBCUTANEOUS | Status: DC
  Administered 2019-10-31 – 2019-11-01 (×2): 20 [IU] via SUBCUTANEOUS
  Filled 2019-10-29 (×4): qty 0.2

## 2019-10-29 MED ORDER — HEPARIN SODIUM (PORCINE) 5000 UNIT/ML IJ SOLN
5000.0000 [IU] | Freq: Two times a day (BID) | INTRAMUSCULAR | Status: DC
  Administered 2019-10-29 – 2019-10-30 (×3): 5000 [IU] via SUBCUTANEOUS
  Filled 2019-10-29 (×4): qty 1

## 2019-10-29 MED ORDER — ATORVASTATIN CALCIUM 20 MG PO TABS
40.0000 mg | ORAL_TABLET | Freq: Every day | ORAL | Status: DC
  Administered 2019-10-29 – 2019-10-31 (×3): 40 mg via ORAL
  Filled 2019-10-29 (×3): qty 2

## 2019-10-29 MED ORDER — PRAZOSIN HCL 1 MG PO CAPS
1.0000 mg | ORAL_CAPSULE | Freq: Every day | ORAL | Status: DC
  Filled 2019-10-29: qty 1

## 2019-10-29 MED ORDER — FUROSEMIDE 40 MG PO TABS
80.0000 mg | ORAL_TABLET | Freq: Every day | ORAL | Status: DC
  Administered 2019-10-29 – 2019-10-30 (×2): 80 mg via ORAL
  Filled 2019-10-29 (×2): qty 2

## 2019-10-29 MED ORDER — ENOXAPARIN SODIUM 40 MG/0.4ML ~~LOC~~ SOLN: 40.0000 mg | SUBCUTANEOUS | Status: DC

## 2019-10-29 MED ORDER — TRAZODONE HCL 50 MG PO TABS
25.0000 mg | ORAL_TABLET | Freq: Every evening | ORAL | Status: DC | PRN
  Administered 2019-10-30: 25 mg via ORAL
  Filled 2019-10-29: qty 1

## 2019-10-29 MED ORDER — CARVEDILOL 25 MG PO TABS
25.0000 mg | ORAL_TABLET | Freq: Two times a day (BID) | ORAL | Status: DC
  Administered 2019-10-29 – 2019-11-01 (×7): 25 mg via ORAL
  Filled 2019-10-29 (×7): qty 1

## 2019-10-29 MED ORDER — BUMETANIDE 1 MG PO TABS: 2.0000 mg | ORAL_TABLET | Freq: Two times a day (BID) | ORAL | Status: DC

## 2019-10-29 MED ORDER — IRBESARTAN 75 MG PO TABS
37.5000 mg | ORAL_TABLET | Freq: Every day | ORAL | Status: DC
  Administered 2019-10-30: 09:00:00 37.5 mg via ORAL
  Filled 2019-10-29 (×2): qty 0.5

## 2019-10-29 MED ORDER — INSULIN ASPART 100 UNIT/ML ~~LOC~~ SOLN
0.0000 [IU] | Freq: Three times a day (TID) | SUBCUTANEOUS | Status: DC
  Administered 2019-10-29: 18:00:00 1 [IU] via SUBCUTANEOUS
  Administered 2019-10-30: 18:00:00 2 [IU] via SUBCUTANEOUS
  Administered 2019-10-31 (×3): 3 [IU] via SUBCUTANEOUS
  Administered 2019-10-31: 17:00:00 1 [IU] via SUBCUTANEOUS
  Administered 2019-11-01: 2 [IU] via SUBCUTANEOUS
  Administered 2019-11-01: 1 [IU] via SUBCUTANEOUS
  Filled 2019-10-29 (×9): qty 1

## 2019-10-29 MED ORDER — SODIUM CHLORIDE 0.9 % IV SOLN
200.0000 mg | Freq: Once | INTRAVENOUS | Status: AC
  Administered 2019-10-29: 200 mg via INTRAVENOUS
  Filled 2019-10-29: qty 200

## 2019-10-29 MED ORDER — MAGNESIUM HYDROXIDE 400 MG/5ML PO SUSP
30.0000 mL | Freq: Every day | ORAL | Status: DC | PRN
  Filled 2019-10-29: qty 30

## 2019-10-29 MED ORDER — ACETAMINOPHEN 650 MG RE SUPP: 650.0000 mg | Freq: Four times a day (QID) | RECTAL | Status: DC | PRN

## 2019-10-29 MED ORDER — ACETAMINOPHEN 325 MG PO TABS
650.0000 mg | ORAL_TABLET | Freq: Four times a day (QID) | ORAL | Status: DC | PRN
  Administered 2019-10-29: 22:00:00 650 mg via ORAL
  Filled 2019-10-29: qty 2

## 2019-10-29 MED ORDER — HYDRALAZINE HCL 20 MG/ML IJ SOLN
10.0000 mg | Freq: Once | INTRAMUSCULAR | Status: AC
  Administered 2019-10-29: 10 mg via INTRAVENOUS
  Filled 2019-10-29: qty 1

## 2019-10-29 MED ORDER — PROMETHAZINE HCL 25 MG/ML IJ SOLN
12.5000 mg | Freq: Once | INTRAMUSCULAR | Status: AC
  Administered 2019-10-29: 07:00:00 12.5 mg via INTRAVENOUS
  Filled 2019-10-29: qty 1

## 2019-10-29 MED ORDER — CLONIDINE HCL 0.1 MG PO TABS
0.1000 mg | ORAL_TABLET | Freq: Two times a day (BID) | ORAL | Status: DC
  Administered 2019-10-29 – 2019-10-30 (×3): 0.1 mg via ORAL
  Filled 2019-10-29 (×3): qty 1

## 2019-10-29 MED ORDER — DEXAMETHASONE 4 MG PO TABS
6.0000 mg | ORAL_TABLET | Freq: Every day | ORAL | Status: DC
  Administered 2019-10-30 – 2019-11-01 (×4): 6 mg via ORAL
  Filled 2019-10-29 (×4): qty 2

## 2019-10-29 MED ORDER — DOXAZOSIN MESYLATE 2 MG PO TABS
2.0000 mg | ORAL_TABLET | Freq: Every day | ORAL | Status: DC
  Administered 2019-10-30 (×2): 2 mg via ORAL
  Filled 2019-10-29 (×4): qty 1

## 2019-10-29 MED ORDER — GENTAMICIN SULFATE 0.1 % EX CREA
1.0000 "application " | TOPICAL_CREAM | Freq: Every day | CUTANEOUS | Status: DC
  Administered 2019-10-29 – 2019-11-01 (×4): 1 via TOPICAL
  Filled 2019-10-29: qty 15

## 2019-10-29 MED ORDER — INSULIN GLARGINE 100 UNIT/ML ~~LOC~~ SOLN
40.0000 [IU] | Freq: Every day | SUBCUTANEOUS | Status: DC
  Filled 2019-10-29: qty 0.4

## 2019-10-29 MED ORDER — SODIUM CHLORIDE 0.9 % IV SOLN: INTRAVENOUS | Status: DC

## 2019-10-29 MED ORDER — ASPIRIN EC 81 MG PO TBEC
81.0000 mg | DELAYED_RELEASE_TABLET | Freq: Every day | ORAL | Status: DC
  Administered 2019-10-29 – 2019-10-30 (×2): 81 mg via ORAL
  Filled 2019-10-29 (×2): qty 1

## 2019-10-29 MED ORDER — DELFLEX-LC/1.5% DEXTROSE 344 MOSM/L IP SOLN
INTRAPERITONEAL | Status: DC
  Administered 2019-10-29: 10:00:00 10000 mL via INTRAPERITONEAL
  Filled 2019-10-29: qty 3000

## 2019-10-29 NOTE — Progress Notes (Signed)
[facility]  ROUNDING NOTE   Subjective:  Patient well-known to Tracy Jennings. We follow her for outpatient peritoneal dialysis. Her fill volume is 2500 cc with 4 refills for a total of 10 L. She is now diagnosed with COVID-19 infection. She had syncopal event at home prior to coming in. Blood pressure quite high at 218/103 upon presentation.  Objective:  Vital signs in last 24 hours:  Temp:  [97.9 F (36.6 C)-99.9 F (37.7 C)] 99.9 F (37.7 C) (02/16 0903) Pulse Rate:  [58-93] 82 (02/16 1042) Resp:  [11-33] 18 (02/16 1030) BP: (151-261)/(55-209) 188/71 (02/16 1030) SpO2:  [92 %-100 %] 99 % (02/16 1030) Weight:  [68 kg] 68 kg (02/15 2104)  Weight change:  Filed Weights   10/28/19 2104  Weight: 68 kg    Intake/Output: I/O last 3 completed shifts: In: 35.3 [I.V.:35.3] Out: -    Intake/Output this shift:  Total I/O In: 555.6 [P.O.:120; I.V.:193.3; IV Piggyback:242.3] Out: -   Physical Exam: General: No acute distress  Head: Normocephalic, atraumatic. Moist oral mucosal membranes  Eyes: Anicteric  Neck: Supple, trachea midline  Lungs:  Clear to auscultation, normal effort  Heart: S1S2 no rubs  Abdomen:  Soft, nontender, bowel sounds present  Extremities: 1+ peripheral edema.  Neurologic: Awake, alert, following commands  Skin: No lesions  Access: Peritoneal dialysis catheter in place    Basic Metabolic Panel: Recent Labs  Lab 10/28/19 2055 10/29/19 0629  NA 136 135  K 4.7 4.2  CL 90* 88*  CO2 20* 18*  GLUCOSE 108* 58*  BUN 68* 75*  CREATININE 20.43* 20.72*  CALCIUM 6.6* 6.3*    Liver Function Tests: Recent Labs  Lab 10/28/19 2055 10/29/19 0629  AST 26 24  ALT 16 13  ALKPHOS 35* 31*  BILITOT 0.9 0.9  PROT 6.6 6.6  ALBUMIN 2.6* 2.7*   No results for input(s): LIPASE, AMYLASE in the last 168 hours. No results for input(s): AMMONIA in the last 168 hours.  CBC: Recent Labs  Lab 10/28/19 2055 10/29/19 0629  WBC 9.6 8.1  NEUTROABS 8.8*   --   HGB 10.4* 9.9*  HCT 32.1* 30.2*  MCV 89.9 89.3  PLT 252 267    Cardiac Enzymes: No results for input(s): CKTOTAL, CKMB, CKMBINDEX, TROPONINI in the last 168 hours.  BNP: Invalid input(s): POCBNP  CBG: Recent Labs  Lab 10/29/19 0832 10/29/19 0903 10/29/19 1147  GLUCAP 50* 70 109*    Microbiology: Results for orders placed or performed during the hospital encounter of 10/28/19  Respiratory Panel by RT PCR (Flu A&B, Covid) - Nasopharyngeal Swab     Status: Abnormal   Collection Time: 10/28/19  9:38 PM   Specimen: Nasopharyngeal Swab  Result Value Ref Range Status   SARS Coronavirus 2 by RT PCR POSITIVE (A) NEGATIVE Final    Comment: RESULT CALLED TO, READ BACK BY AND VERIFIED WITH: Charolotte Capuchin RN [id number] 10/28/19 HNM (NOTE) SARS-CoV-2 target nucleic acids are DETECTED. SARS-CoV-2 RNA is generally detectable in upper respiratory specimens  during the acute phase of infection. Positive results are indicative of the presence of the identified virus, but do not rule out bacterial infection or co-infection with other pathogens not detected by the test. Clinical correlation with patient history and other diagnostic information is necessary to determine patient infection status. The expected result is Negative. Fact Sheet for Patients:  PinkCheek.be Fact Sheet for Healthcare Providers: GravelBags.it This test is not yet approved or cleared by the [place] and  has been authorized for detection and/or diagnosis of SARS-CoV-2 by FDA under an Emergency Use Authorization (EUA).  This EUA will remain in effect (meaning this test can be used) f or the duration of  the COVID-19 declaration under Section 564(b)(1) of the Act, 21 U.S.C. section 360bbb-3(b)(1), unless the authorization is terminated or revoked sooner.    Influenza A by PCR NEGATIVE NEGATIVE Final   Influenza B by PCR NEGATIVE NEGATIVE Final     Comment: (NOTE) The Xpert Xpress SARS-CoV-2/FLU/RSV assay is intended as an aid in  the diagnosis of influenza from Nasopharyngeal swab specimens and  should not be used as a sole basis for treatment. Nasal washings and  aspirates are unacceptable for Xpert Xpress SARS-CoV-2/FLU/RSV  testing. Fact Sheet for Patients: PinkCheek.be Fact Sheet for Healthcare Providers: GravelBags.it This test is not yet approved or cleared by the [place] FDA and  has been authorized for detection and/or diagnosis of SARS-CoV-2 by  FDA under an Emergency Use Authorization (EUA). This EUA will remain  in effect (meaning this test can be used) for the duration of the  Covid-19 declaration under Section 564(b)(1) of the Act, 21  U.S.C. section 360bbb-3(b)(1), unless the authorization is  terminated or revoked. Performed at [facility], [address] [place], [place] [address]     Coagulation Studies: No results for input(s): LABPROT, INR in the last 72 hours.  Urinalysis: No results for input(s): COLORURINE, LABSPEC, PHURINE, GLUCOSEU, HGBUR, BILIRUBINUR, KETONESUR, PROTEINUR, UROBILINOGEN, NITRITE, LEUKOCYTESUR in the last 72 hours.  Invalid input(s): APPERANCEUR    Imaging: CT Head Wo Contrast  Result Date: 10/28/2019 CLINICAL DATA:  [place], altered level of consciousness, headache EXAM: CT HEAD WITHOUT CONTRAST TECHNIQUE: Contiguous axial images were obtained from the base of the skull through the vertex without intravenous contrast. COMPARISON:  05/16/2018 FINDINGS: Brain: No acute infarct or hemorrhage. Lateral ventricles and midline structures appear unremarkable. No acute extra-axial fluid collections. No mass effect. Vascular: No hyperdense vessel or unexpected calcification. Skull: Normal. Negative for fracture or focal lesion. Sinuses/Orbits: No acute finding. Other: None IMPRESSION: 1. Stable head CT, no acute process.  Electronically Signed   By: Randa Ngo M.D.   On: 10/28/2019 21:26   CT Cervical Spine Wo Contrast  Result Date: 10/28/2019 CLINICAL DATA:  [place], altered level of consciousness EXAM: CT CERVICAL SPINE WITHOUT CONTRAST TECHNIQUE: Multidetector CT imaging of the cervical spine was performed without intravenous contrast. Multiplanar CT image reconstructions were also generated. COMPARISON:  None. FINDINGS: Alignment: Cervical vertebral bodies are in grossly anatomic alignment. Skull base and vertebrae: There are no acute displaced fractures. Soft tissues and spinal canal: No prevertebral fluid or swelling. No visible canal hematoma. Disc levels: Multilevel cervical spondylosis is identified, most pronounced at the C5-6, C6-7, and C7/T1 levels. There is disc space narrowing, circumferential disc osteophyte complex, and uncovertebral hypertrophy. Mild symmetrical neural foraminal encroachment at C5/C6. Upper chest: Central airways patent. Scattered pleural and parenchymal scarring at the lung apices. Other: Reconstructed images demonstrate no additional findings. IMPRESSION: 1. Multilevel cervical spondylosis.  No acute fractures. Electronically Signed   By: Randa Ngo M.D.   On: 10/28/2019 21:28     Medications:   . sodium chloride Stopped (10/29/19 0919)  . dialysis solution 1.5% low-MG/low-CA    . [START ON 10/30/2019] remdesivir 100 mg in NS 100 mL     . aspirin EC  81 mg Oral Daily  . atorvastatin  40 mg Oral q1800  . bumetanide  2 mg Oral  BID  . calcitRIOL  0.25 mcg Oral Daily  . calcium acetate  1,334 mg Oral TID WC  . carvedilol  25 mg Oral BID  . cloNIDine  0.1 mg Oral BID  . clopidogrel  75 mg Oral Daily  . doxazosin  2 mg Oral QHS  . fluticasone  2 spray Each Nare Daily  . furosemide  80 mg Oral Daily  . gentamicin cream  1 application Topical Daily  . heparin injection (subcutaneous)  5,000 Units Subcutaneous Q12H  . insulin aspart  0-9 Units Subcutaneous TID PC & HS  .  insulin glargine  40 Units Subcutaneous Daily  . irbesartan  37.5 mg Oral Daily  . losartan  25 mg Oral Daily  . prazosin  1 mg Oral QHS  . Vitamin D (Ergocalciferol)  50,000 Units Oral Q7 days   acetaminophen **OR** acetaminophen, labetalol, labetalol, magnesium hydroxide, ondansetron **OR** ondansetron (ZOFRAN) IV, traZODone  Assessment/ Plan:  64 y.o. female with past medical history of ESRD on PD, diabetes mellitus type 2, hyperlipidemia, hypertension, peripheral vascular disease, anemia of chronic kidney disease, secondary hyperparathyroidism who was admitted with syncope and COVID-19 infection.  1.  ESRD on peritoneal dialysis.  Patient has been started on peritoneal dialysis this a.m.  She will continue this through the daytime and we will stop later this evening.  Tomorrow we plan to start the patient back in the evening.  We will use 2.5 L fill volume with 4 exchanges for 8 hours today.  2.  Hypertension.  Blood pressure noted to be quite high.  Maintain the patient on carvedilol, clonidine.  Patient noted to be on irbesartan and losartan.  We will stop losartan as patient does not need two ARB's.  3.  Anemia of chronic kidney disease.  Hemoglobin 9.9.  Patient receives Epogen as an outpatient.  4.  COVID-19 infection.  Treatment as per primary team.   LOS: 0 Loney Domingo 2/16/202112:00 PM

## 2019-10-29 NOTE — Progress Notes (Signed)
Pt CBG 66 at 2000, lemon lime soda 4oz given, Temp increase to 102.3 Tylenol given. BP increase PM BP meds given.

## 2019-10-29 NOTE — Progress Notes (Addendum)
[facility] [place] NAME: Tracy Jennings    MR#:  [id number]  DATE OF BIRTH:  07-19-1956  SUBJECTIVE:  patient appears very tired and sleepy. Had one diarrheal stools. Does not want to eat. Denies any fever. Getting peritoneal dialysis  Low grade fever REVIEW OF SYSTEMS:   Review of Systems  Constitutional: Positive for fever and malaise/fatigue. Negative for chills and weight loss.  HENT: Negative for ear discharge, ear pain and nosebleeds.   Eyes: Negative for blurred vision, pain and discharge.  Respiratory: Positive for cough. Negative for sputum production, shortness of breath, wheezing and stridor.   Cardiovascular: Negative for chest pain, palpitations, orthopnea and PND.  Gastrointestinal: Negative for abdominal pain, diarrhea, nausea and vomiting.  Genitourinary: Negative for frequency and urgency.  Musculoskeletal: Negative for back pain and joint pain.  Neurological: Positive for weakness. Negative for sensory change, speech change and focal weakness.  Psychiatric/Behavioral: Negative for depression and hallucinations. The patient is not nervous/anxious.    Tolerating Diet:no Tolerating PT: not needed  DRUG ALLERGIES:   Allergies  Allergen Reactions  . Amlodipine Swelling    Knees down to ankles Knees down to ankles  . Hctz [Hydrochlorothiazide]     pancreatitis  . Other Other (See Comments)    Reports cardiac arrest when given during surgery Reports cardiac arrest when given during surgery Reports cardiac arrest when given during surgery   . Amlodipine Besylate Swelling  . Sulfa Antibiotics Rash  . Dairy Aid [Lactase]     Runny nose  . Hydralazine Nausea Only    VITALS:  Blood pressure (!) 188/71, pulse 82, temperature 99.9 F (37.7 C), temperature source Oral, resp. rate 18, height 5\' 3"  (1.6 m), weight 68 kg, SpO2 99 %.  PHYSICAL EXAMINATION:   Physical Exam  GENERAL:  64 y.o.-year-old patient lying in  the bed with no acute distress.  EYES: Pupils equal, round, reactive to light and accommodation. No scleral icterus.   HEENT: Head atraumatic, normocephalic. Oropharynx and nasopharynx clear.  NECK:  Supple, no jugular venous distention. No thyroid enlargement, no tenderness.  LUNGS: Normal breath sounds bilaterally, no wheezing, rales, rhonchi. No use of accessory muscles of respiration.  CARDIOVASCULAR: S1, S2 normal. No murmurs, rubs, or gallops.  ABDOMEN: Soft, nontender, nondistended. Bowel sounds present. No organomegaly or mass. PD cath + EXTREMITIES: No cyanosis, clubbing or edema b/l.    NEUROLOGIC: Cranial nerves II through XII are intact. No focal Motor or sensory deficits b/l.   PSYCHIATRIC:  patient is alert and oriented x 3.  SKIN: No obvious rash, lesion, or ulcer.   LABORATORY PANEL:  CBC Recent Labs  Lab 10/29/19 0629  WBC 8.1  HGB 9.9*  HCT 30.2*  PLT 267    Chemistries  Recent Labs  Lab 10/29/19 0629  NA 135  K 4.2  CL 88*  CO2 18*  GLUCOSE 58*  BUN 75*  CREATININE 20.72*  CALCIUM 6.3*  AST 24  ALT 13  ALKPHOS 31*  BILITOT 0.9   Cardiac Enzymes No results for input(s): TROPONINI in the last 168 hours. RADIOLOGY:  CT Head Wo Contrast  Result Date: 10/28/2019 CLINICAL DATA:  [place], altered level of consciousness, headache EXAM: CT HEAD WITHOUT CONTRAST TECHNIQUE: Contiguous axial images were obtained from the base of the skull through the vertex without intravenous contrast. COMPARISON:  05/16/2018 FINDINGS: Brain: No acute infarct or hemorrhage. Lateral ventricles and midline structures appear unremarkable. No acute extra-axial fluid collections. No  mass effect. Vascular: No hyperdense vessel or unexpected calcification. Skull: Normal. Negative for fracture or focal lesion. Sinuses/Orbits: No acute finding. Other: None IMPRESSION: 1. Stable head CT, no acute process. Electronically Signed   By: Randa Ngo M.D.   On: 10/28/2019 21:26   CT Cervical  Spine Wo Contrast  Result Date: 10/28/2019 CLINICAL DATA:  [place], altered level of consciousness EXAM: CT CERVICAL SPINE WITHOUT CONTRAST TECHNIQUE: Multidetector CT imaging of the cervical spine was performed without intravenous contrast. Multiplanar CT image reconstructions were also generated. COMPARISON:  None. FINDINGS: Alignment: Cervical vertebral bodies are in grossly anatomic alignment. Skull base and vertebrae: There are no acute displaced fractures. Soft tissues and spinal canal: No prevertebral fluid or swelling. No visible canal hematoma. Disc levels: Multilevel cervical spondylosis is identified, most pronounced at the C5-6, C6-7, and C7/T1 levels. There is disc space narrowing, circumferential disc osteophyte complex, and uncovertebral hypertrophy. Mild symmetrical neural foraminal encroachment at C5/C6. Upper chest: Central airways patent. Scattered pleural and parenchymal scarring at the lung apices. Other: Reconstructed images demonstrate no additional findings. IMPRESSION: 1. Multilevel cervical spondylosis.  No acute fractures. Electronically Signed   By: Randa Ngo M.D.   On: 10/28/2019 21:28   US Carotid Bilateral  Result Date: 10/29/2019 CLINICAL DATA:  Syncope EXAM: BILATERAL CAROTID DUPLEX ULTRASOUND TECHNIQUE: Pearline Cables scale imaging, color Doppler and duplex ultrasound were performed of bilateral carotid and vertebral arteries in the neck. Technologist describes technically difficult study secondary to lack of patient cooperation, and vessel movement with respirations. COMPARISON:  None. FINDINGS: Criteria: Quantification of carotid stenosis is based on velocity parameters that correlate the residual internal carotid diameter with NASCET-based stenosis levels, using the diameter of the distal internal carotid lumen as the denominator for stenosis measurement. The following velocity measurements were obtained: RIGHT ICA: 161/29 cm/sec CCA: 82/99 cm/sec SYSTOLIC ICA/CCA RATIO:  1.7 ECA:  150 cm/sec LEFT ICA: 90/29 cm/sec CCA: 371/69 cm/sec SYSTOLIC ICA/CCA RATIO:  0.8 ECA: 112 cm/sec RIGHT CAROTID ARTERY: Smooth partially calcified plaque in the bulb and proximal ICA. No high-grade stenosis. Normal waveforms and color Doppler signal. Mild tortuosity. The measured elevated peak systolic velocities are probably overestimated due to poor angle correction. RIGHT VERTEBRAL ARTERY:  Normal flow direction and waveform. LEFT CAROTID ARTERY: Mild tortuosity. Calcified plaque at the carotid bifurcation and proximal ICA. No high-grade stenosis. Normal waveforms and color Doppler signal. LEFT VERTEBRAL ARTERY:  Normal flow direction and waveform. IMPRESSION: 1. Bilateral carotid bifurcation plaque resulting in less than 50% diameter ICA stenosis. 2. Antegrade bilateral vertebral arterial flow. Electronically Signed   By: Lucrezia Europe M.D.   On: 10/29/2019 13:43   ECHOCARDIOGRAM COMPLETE  Result Date: 10/29/2019    ECHOCARDIOGRAM REPORT   Patient Name:   Tracy Jennings Date of Exam: 10/29/2019 Medical Rec #:  [id number]        Height:       63.0 in Accession #:    [id number]       Weight:       150.0 lb Date of Birth:  07-Jun-1956        BSA:          1.71 m Patient Age:    16 years         BP:           188/71 mmHg Patient Gender: F                HR:  82 bpm. Exam Location:  [facility] Procedure: 2D Echo, Cardiac Doppler and Color Doppler Indications:     Syncope 780.2  History:         Patient has prior history of Echocardiogram examinations, most                  recent 05/12/2018. Signs/Symptoms:Murmur; Risk                  Factors:Hypertension and Diabetes. PVD.  Sonographer:     Sherrie Sport RDCS (AE) Referring Phys:  [id number] [place] Diagnosing Phys: Neoma Laming MD IMPRESSIONS  1. Left ventricular ejection fraction, by estimation, is 60 to 65%. The left ventricle has normal function. The left ventricle has no regional wall motion abnormalities. There is moderate concentric left ventricular  hypertrophy. Left ventricular diastolic parameters are consistent with Grade I diastolic dysfunction (impaired relaxation).  2. Right ventricular systolic function is normal. The right ventricular size is normal. There is normal pulmonary artery systolic pressure.  3. The mitral valve is normal in structure and function. Trivial mitral valve regurgitation. No evidence of mitral stenosis.  4. The aortic valve is normal in structure and function. Aortic valve regurgitation is not visualized. No aortic stenosis is present.  5. The inferior vena cava is normal in size with greater than 50% respiratory variability, suggesting right atrial pressure of 3 mmHg. FINDINGS  Left Ventricle: Left ventricular ejection fraction, by estimation, is 60 to 65%. The left ventricle has normal function. The left ventricle has no regional wall motion abnormalities. The left ventricular internal cavity size was normal in size. There is  moderate concentric left ventricular hypertrophy. Left ventricular diastolic parameters are consistent with Grade I diastolic dysfunction (impaired relaxation). Right Ventricle: The right ventricular size is normal. No increase in right ventricular wall thickness. Right ventricular systolic function is normal. There is normal pulmonary artery systolic pressure. The tricuspid regurgitant velocity is 1.75 m/s, and  with an assumed right atrial pressure of 10 mmHg, the estimated right ventricular systolic pressure is 39.0 mmHg. Left Atrium: Left atrial size was normal in size. Right Atrium: Right atrial size was normal in size. Pericardium: There is no evidence of pericardial effusion. Mitral Valve: The mitral valve is normal in structure and function. Normal mobility of the mitral valve leaflets. Trivial mitral valve regurgitation. No evidence of mitral valve stenosis. Tricuspid Valve: The tricuspid valve is normal in structure. Tricuspid valve regurgitation is trivial. No evidence of tricuspid stenosis.  Aortic Valve: The aortic valve is normal in structure and function. Aortic valve regurgitation is not visualized. No aortic stenosis is present. Aortic valve mean gradient measures 4.3 mmHg. Aortic valve peak gradient measures 7.8 mmHg. Aortic valve area, by VTI measures 1.48 cm. Pulmonic Valve: The pulmonic valve was normal in structure. Pulmonic valve regurgitation is not visualized. No evidence of pulmonic stenosis. Aorta: The aortic root is normal in size and structure. Venous: The inferior vena cava is normal in size with greater than 50% respiratory variability, suggesting right atrial pressure of 3 mmHg. IAS/Shunts: No atrial level shunt detected by color flow Doppler.  LEFT VENTRICLE PLAX 2D LVIDd:         4.58 cm LVIDs:         2.91 cm LV PW:         1.33 cm LV IVS:        0.97 cm LVOT diam:     2.00 cm LV SV:  43.67 ml LV SV Index:   36.38 LVOT Area:     3.14 cm  LEFT ATRIUM         Index LA diam:    4.30 cm 2.51 cm/m  AORTIC VALVE                   PULMONIC VALVE AV Area (Vmax):    1.60 cm    PV Vmax:        0.90 m/s AV Area (Vmean):   1.24 cm    PV Peak grad:   3.3 mmHg AV Area (VTI):     1.48 cm    RVOT Peak grad: 3 mmHg AV Vmax:           139.33 cm/s AV Vmean:          96.633 cm/s AV VTI:            0.295 m AV Peak Grad:      7.8 mmHg AV Mean Grad:      4.3 mmHg LVOT Vmax:         70.90 cm/s LVOT Vmean:        38.100 cm/s LVOT VTI:          0.139 m LVOT/AV VTI ratio: 0.47  AORTA Ao Root diam: 2.50 cm MITRAL VALVE               TRICUSPID VALVE MV Area (PHT): 3.60 cm    TR Peak grad:   12.2 mmHg MV Decel Time: 211 msec    TR Vmax:        175.00 cm/s MV E velocity: 72.30 cm/s MV A velocity: 91.20 cm/s  SHUNTS MV E/A ratio:  0.79        Systemic VTI:  0.14 m                            Systemic Diam: 2.00 cm Neoma Laming MD Electronically signed by Neoma Laming MD Signature Date/Time: 10/29/2019/2:19:33 PM    Final    ASSESSMENT AND PLAN:  Tracy Jennings  is a 64 y.o. female with a known  history of end-stage renal disease on peritoneal dialysis, hypertension, type diabetes mellitus and peripheral vascular disease, who presented to the emergency room with acute onset of syncope.  She was last seen around 7 PM on 2/15 by her husband who later went to the store and when he came back he found her on the ground. She admits to intermittent diarrhea over the last couple weeks   1. Syncope. T -etiology unclear  CT head negative CT cervical spine--multilevel DJD - carotid Doppler negative for high grade stenosis -2D echo EF of 60 to 65%. -gentle hydrated with IV normal saline remains in sinus rhythm  2.  Hypertensive urgency.  The patient will be placed on as needed IV labetalol and continued on her antihypertensives. -bp improved after PD this am  3.End-stage renal disease on peritoneal dialysis. -  Nephrology consultation with Dr. Anthonette Legato   4. COVID-19 Pneumonia.  She currently has normal pulse oximetry.  She has been having symptoms including weakness, fatigue, body aches and diarrhea as well as cough.  -on IV Remdesivir (2/5).   -CRP 5.8 -sats >92 %on RA -CXR ordered--not done in ER--shows right side infiltrate -po decadron  5.Type 2 diabetes mellitus.  She will be placed on supplement coverage with NovoLog and will continue basal coverage. -Insulin Lantus 20 units qd--ad just  according to sugars  6.DVT prophylaxis.  Subcut  Heparin.  Procedures:none Family communication :pt Consults :nephrology Discharge Disposition :home CODE STATUS: full DVT Prophylaxis :heparin Barriers to discharge:none  TOTAL TIME TAKING CARE OF THIS PATIENT: 25 minutes.  >50% time spent on counselling and coordination of care  Note: This dictation was prepared with Dragon dictation along with smaller phrase technology. Any transcriptional errors that result from this process are unintentional.  Fritzi Mandes M.D    [facility]   CC: Primary care physician; Ricardo Jericho, NPPatient ID: LALA BEEN, female   DOB: 05-17-1956, 64 y.o.   MRN: [id number]

## 2019-10-29 NOTE — ED Notes (Signed)
This RN notified NP Sharion Settler of pt BP of 231/86 at 0000.

## 2019-10-29 NOTE — Progress Notes (Signed)
Peritoneal dialysis patient known at [facility]. Patient normally drives self and does own treatments. Since patient does home treatments there will be no change to schedule due to being COVID positive, unless there is a change in patient status. Please contact me with any dialysis placement concerns.

## 2019-10-29 NOTE — Progress Notes (Signed)
Inpatient Diabetes Program Recommendations  AACE/ADA: New Consensus Statement on Inpatient Glycemic Control (2015)  Target Ranges:  Prepandial:   less than 140 mg/dL      Peak postprandial:   less than 180 mg/dL (1-2 hours)      Critically ill patients:  140 - 180 mg/dL   Lab Results  Component Value Date   GLUCAP 109 (H) 10/29/2019   HGBA1C 11.2 (H) 06/13/2014    Review of Glycemic Control Results for ELAYSHA, BEVARD (MRN [id number]) as of 10/29/2019 12:56  Ref. Range 10/29/2019 08:32 10/29/2019 09:03 10/29/2019 11:47  Glucose-Capillary Latest Ref Range: 70 - 99 mg/dL 50 (L) 70 109 (H)   Diabetes history: DM2/ ESRD Outpatient Diabetes medications:  Lantus 40 units daily, Novolog 10-12 units tid with meals Current orders for Inpatient glycemic control:  Lantus 40 units daily, Novolog sensitive tid with meals  Inpatient Diabetes Program Recommendations:   A1C pending. Note low blood sugar this AM.  Lantus held per MD orders this AM.  Consider reducing Lantus to 20 units daily starting 2/17.   Thanks,  Adah Perl, RN, BC-ADM Inpatient Diabetes Coordinator Pager [phone] (8a-5p)

## 2019-10-29 NOTE — Progress Notes (Signed)
*  PRELIMINARY RESULTS* Echocardiogram 2D Echocardiogram has been performed.  Sherrie Sport 10/29/2019, 1:00 PM

## 2019-10-29 NOTE — Progress Notes (Signed)
Resistant hypertension. No results from use of IV beta blocker. Noted nausea reaction to hydralazine. Ordered hydralazine with phenergan

## 2019-10-29 NOTE — Progress Notes (Signed)
Remdesivir - Pharmacy Brief Note   O: 96 ALT: 16 CXR: evidence of infection SpO2: 96% on RA   A/P:  Remdesivir 200 mg IVPB once followed by 100 mg IVPB daily x 4 days.   Hart Robinsons, PharmD Clinical Pharmacist   10/29/2019 3:04 AM

## 2019-10-29 NOTE — Plan of Care (Signed)
  Problem: Education: Goal: Knowledge of General Education information will improve Description Including pain rating scale, medication(s)/side effects and non-pharmacologic comfort measures Outcome: Progressing   

## 2019-10-29 NOTE — ED Notes (Signed)
This RN spoke with pt husband Charliee Krenz about pt plan of care and admission. Pt husband states understanding and denies any further questions

## 2019-10-29 NOTE — Progress Notes (Signed)
Pt tolerated PD tx and UF is 133ml with dwell time of 1.5hrs.

## 2019-10-30 DIAGNOSIS — I10 Essential (primary) hypertension: Secondary | ICD-10-CM

## 2019-10-30 LAB — GLUCOSE, CAPILLARY
Glucose-Capillary: 116 mg/dL — ABNORMAL HIGH (ref 70–99)
Glucose-Capillary: 163 mg/dL — ABNORMAL HIGH (ref 70–99)
Glucose-Capillary: 186 mg/dL — ABNORMAL HIGH (ref 70–99)
Glucose-Capillary: 83 mg/dL (ref 70–99)
Glucose-Capillary: 88 mg/dL (ref 70–99)

## 2019-10-30 MED ORDER — CLONIDINE HCL 0.1 MG PO TABS
0.2000 mg | ORAL_TABLET | Freq: Two times a day (BID) | ORAL | Status: DC
  Administered 2019-10-31: 0.2 mg via ORAL
  Filled 2019-10-30 (×5): qty 2

## 2019-10-30 NOTE — Progress Notes (Signed)
[facility]  ROUNDING NOTE   Subjective:  Patient remains admitted to the [facility] ward. Due for peritoneal dialysis again tonight. Does not report any worsening shortness of breath.  Objective:  Vital signs in last 24 hours:  Temp:  [99.9 F (37.7 C)-102.3 F (39.1 C)] 99.9 F (37.7 C) (02/17 0550) Pulse Rate:  [74-89] 74 (02/17 0858) Resp:  [16-24] 16 (02/17 0550) BP: (146-197)/(54-123) 166/54 (02/17 0858) SpO2:  [89 %-100 %] 98 % (02/17 0858) Weight:  [65.9 kg] 65.9 kg (02/17 0500)  Weight change: -2.14 kg Filed Weights   10/28/19 2104 10/30/19 0500  Weight: 68 kg 65.9 kg    Intake/Output: I/O last 3 completed shifts: In: 063 [P.O.:120; I.V.:581.7; IV Piggyback:242.3] Out: -    Intake/Output this shift:  Total I/O In: 695.4 [P.O.:240; I.V.:355.4; IV Piggyback:100] Out: -   Physical Exam: General: No acute distress  Head: Normocephalic, atraumatic. Moist oral mucosal membranes  Eyes: Anicteric  Neck: Supple, trachea midline  Lungs:  Nonlabored breathing  Heart: No rubs  Abdomen:  Soft, nontender  Extremities: trace peripheral edema.  Neurologic: Awake, alert, following commands  Skin: No lesions  Access: Peritoneal dialysis catheter in place    Basic Metabolic Panel: Recent Labs  Lab 10/28/19 2055 10/29/19 0629  NA 136 135  K 4.7 4.2  CL 90* 88*  CO2 20* 18*  GLUCOSE 108* 58*  BUN 68* 75*  CREATININE 20.43* 20.72*  CALCIUM 6.6* 6.3*    Liver Function Tests: Recent Labs  Lab 10/28/19 2055 10/29/19 0629  AST 26 24  ALT 16 13  ALKPHOS 35* 31*  BILITOT 0.9 0.9  PROT 6.6 6.6  ALBUMIN 2.6* 2.7*   No results for input(s): LIPASE, AMYLASE in the last 168 hours. No results for input(s): AMMONIA in the last 168 hours.  CBC: Recent Labs  Lab 10/28/19 2055 10/29/19 0629  WBC 9.6 8.1  NEUTROABS 8.8*  --   HGB 10.4* 9.9*  HCT 32.1* 30.2*  MCV 89.9 89.3  PLT 252 267    Cardiac Enzymes: No results for input(s): CKTOTAL, CKMB,  CKMBINDEX, TROPONINI in the last 168 hours.  BNP: Invalid input(s): POCBNP  CBG: Recent Labs  Lab 10/29/19 2127 10/29/19 2315 10/30/19 0013 10/30/19 0742 10/30/19 1129  GLUCAP 66* 66* 83 88 116*    Microbiology: Results for orders placed or performed during the hospital encounter of 10/28/19  Respiratory Panel by RT PCR (Flu A&B, Covid) - Nasopharyngeal Swab     Status: Abnormal   Collection Time: 10/28/19  9:38 PM   Specimen: Nasopharyngeal Swab  Result Value Ref Range Status   SARS Coronavirus 2 by RT PCR POSITIVE (A) NEGATIVE Final    Comment: RESULT CALLED TO, READ BACK BY AND VERIFIED WITH: Charolotte Capuchin RN 0160 10/28/19 HNM (NOTE) SARS-CoV-2 target nucleic acids are DETECTED. SARS-CoV-2 RNA is generally detectable in upper respiratory specimens  during the acute phase of infection. Positive results are indicative of the presence of the identified virus, but do not rule out bacterial infection or co-infection with other pathogens not detected by the test. Clinical correlation with patient history and other diagnostic information is necessary to determine patient infection status. The expected result is Negative. Fact Sheet for Patients:  PinkCheek.be Fact Sheet for Healthcare Providers: GravelBags.it This test is not yet approved or cleared by the [place] FDA and  has been authorized for detection and/or diagnosis of SARS-CoV-2 by FDA under an Emergency Use Authorization (EUA).  This EUA will remain in  effect (meaning this test can be used) f or the duration of  the COVID-19 declaration under Section 564(b)(1) of the Act, 21 U.S.C. section 360bbb-3(b)(1), unless the authorization is terminated or revoked sooner.    Influenza A by PCR NEGATIVE NEGATIVE Final   Influenza B by PCR NEGATIVE NEGATIVE Final    Comment: (NOTE) The Xpert Xpress SARS-CoV-2/FLU/RSV assay is intended as an aid in  the  diagnosis of influenza from Nasopharyngeal swab specimens and  should not be used as a sole basis for treatment. Nasal washings and  aspirates are unacceptable for Xpert Xpress SARS-CoV-2/FLU/RSV  testing. Fact Sheet for Patients: PinkCheek.be Fact Sheet for Healthcare Providers: GravelBags.it This test is not yet approved or cleared by the [place] FDA and  has been authorized for detection and/or diagnosis of SARS-CoV-2 by  FDA under an Emergency Use Authorization (EUA). This EUA will remain  in effect (meaning this test can be used) for the duration of the  Covid-19 declaration under Section 564(b)(1) of the Act, 21  U.S.C. section 360bbb-3(b)(1), unless the authorization is  terminated or revoked. Performed at [facility], [address] [place], [place] [address]     Coagulation Studies: No results for input(s): LABPROT, INR in the last 72 hours.  Urinalysis: No results for input(s): COLORURINE, LABSPEC, PHURINE, GLUCOSEU, HGBUR, BILIRUBINUR, KETONESUR, PROTEINUR, UROBILINOGEN, NITRITE, LEUKOCYTESUR in the last 72 hours.  Invalid input(s): APPERANCEUR    Imaging: CT Head Wo Contrast  Result Date: 10/28/2019 CLINICAL DATA:  [place], altered level of consciousness, headache EXAM: CT HEAD WITHOUT CONTRAST TECHNIQUE: Contiguous axial images were obtained from the base of the skull through the vertex without intravenous contrast. COMPARISON:  05/16/2018 FINDINGS: Brain: No acute infarct or hemorrhage. Lateral ventricles and midline structures appear unremarkable. No acute extra-axial fluid collections. No mass effect. Vascular: No hyperdense vessel or unexpected calcification. Skull: Normal. Negative for fracture or focal lesion. Sinuses/Orbits: No acute finding. Other: None IMPRESSION: 1. Stable head CT, no acute process. Electronically Signed   By: Randa Ngo M.D.   On: 10/28/2019 21:26   CT Cervical Spine  Wo Contrast  Result Date: 10/28/2019 CLINICAL DATA:  [place], altered level of consciousness EXAM: CT CERVICAL SPINE WITHOUT CONTRAST TECHNIQUE: Multidetector CT imaging of the cervical spine was performed without intravenous contrast. Multiplanar CT image reconstructions were also generated. COMPARISON:  None. FINDINGS: Alignment: Cervical vertebral bodies are in grossly anatomic alignment. Skull base and vertebrae: There are no acute displaced fractures. Soft tissues and spinal canal: No prevertebral fluid or swelling. No visible canal hematoma. Disc levels: Multilevel cervical spondylosis is identified, most pronounced at the C5-6, C6-7, and C7/T1 levels. There is disc space narrowing, circumferential disc osteophyte complex, and uncovertebral hypertrophy. Mild symmetrical neural foraminal encroachment at C5/C6. Upper chest: Central airways patent. Scattered pleural and parenchymal scarring at the lung apices. Other: Reconstructed images demonstrate no additional findings. IMPRESSION: 1. Multilevel cervical spondylosis.  No acute fractures. Electronically Signed   By: Randa Ngo M.D.   On: 10/28/2019 21:28   US Carotid Bilateral  Result Date: 10/29/2019 CLINICAL DATA:  Syncope EXAM: BILATERAL CAROTID DUPLEX ULTRASOUND TECHNIQUE: Pearline Cables scale imaging, color Doppler and duplex ultrasound were performed of bilateral carotid and vertebral arteries in the neck. Technologist describes technically difficult study secondary to lack of patient cooperation, and vessel movement with respirations. COMPARISON:  None. FINDINGS: Criteria: Quantification of carotid stenosis is based on velocity parameters that correlate the residual internal carotid diameter with NASCET-based stenosis levels, using the diameter of  the distal internal carotid lumen as the denominator for stenosis measurement. The following velocity measurements were obtained: RIGHT ICA: 161/29 cm/sec CCA: 18/84 cm/sec SYSTOLIC ICA/CCA RATIO:  1.7 ECA: 150  cm/sec LEFT ICA: 90/29 cm/sec CCA: 166/06 cm/sec SYSTOLIC ICA/CCA RATIO:  0.8 ECA: 112 cm/sec RIGHT CAROTID ARTERY: Smooth partially calcified plaque in the bulb and proximal ICA. No high-grade stenosis. Normal waveforms and color Doppler signal. Mild tortuosity. The measured elevated peak systolic velocities are probably overestimated due to poor angle correction. RIGHT VERTEBRAL ARTERY:  Normal flow direction and waveform. LEFT CAROTID ARTERY: Mild tortuosity. Calcified plaque at the carotid bifurcation and proximal ICA. No high-grade stenosis. Normal waveforms and color Doppler signal. LEFT VERTEBRAL ARTERY:  Normal flow direction and waveform. IMPRESSION: 1. Bilateral carotid bifurcation plaque resulting in less than 50% diameter ICA stenosis. 2. Antegrade bilateral vertebral arterial flow. Electronically Signed   By: Lucrezia Europe M.D.   On: 10/29/2019 13:43   DG Chest Port 1 View  Result Date: 10/29/2019 CLINICAL DATA:  COVID positive. EXAM: PORTABLE CHEST 1 VIEW COMPARISON:  05/20/2018 FINDINGS: 3:22 p.m. Patchy bilateral airspace disease noted, right greater than left. The cardiopericardial silhouette is within normal limits for size. The visualized bony structures of the thorax are intact. Telemetry leads overlie the chest. IMPRESSION: Patchy bilateral airspace opacity consistent with multifocal pneumonia. Electronically Signed   By: Misty Stanley M.D.   On: 10/29/2019 17:22   ECHOCARDIOGRAM COMPLETE  Result Date: 10/29/2019    ECHOCARDIOGRAM REPORT   Patient Name:   KAYTLYN DIN Date of Exam: 10/29/2019 Medical Rec #:  [id number]        Height:       63.0 in Accession #:    [id number]       Weight:       150.0 lb Date of Birth:  June 30, 1956        BSA:          1.71 m Patient Age:    57 years         BP:           188/71 mmHg Patient Gender: F                HR:           82 bpm. Exam Location:  [facility] Procedure: 2D Echo, Cardiac Doppler and Color Doppler Indications:     Syncope 780.2  History:          Patient has prior history of Echocardiogram examinations, most                  recent 05/12/2018. Signs/Symptoms:Murmur; Risk                  Factors:Hypertension and Diabetes. PVD.  Sonographer:     Sherrie Sport RDCS (AE) Referring Phys:  [id number] [place] Diagnosing Phys: Neoma Laming MD IMPRESSIONS  1. Left ventricular ejection fraction, by estimation, is 60 to 65%. The left ventricle has normal function. The left ventricle has no regional wall motion abnormalities. There is moderate concentric left ventricular hypertrophy. Left ventricular diastolic parameters are consistent with Grade I diastolic dysfunction (impaired relaxation).  2. Right ventricular systolic function is normal. The right ventricular size is normal. There is normal pulmonary artery systolic pressure.  3. The mitral valve is normal in structure and function. Trivial mitral valve regurgitation. No evidence of mitral stenosis.  4. The aortic valve is normal in structure and function. Aortic valve regurgitation is  not visualized. No aortic stenosis is present.  5. The inferior vena cava is normal in size with greater than 50% respiratory variability, suggesting right atrial pressure of 3 mmHg. FINDINGS  Left Ventricle: Left ventricular ejection fraction, by estimation, is 60 to 65%. The left ventricle has normal function. The left ventricle has no regional wall motion abnormalities. The left ventricular internal cavity size was normal in size. There is  moderate concentric left ventricular hypertrophy. Left ventricular diastolic parameters are consistent with Grade I diastolic dysfunction (impaired relaxation). Right Ventricle: The right ventricular size is normal. No increase in right ventricular wall thickness. Right ventricular systolic function is normal. There is normal pulmonary artery systolic pressure. The tricuspid regurgitant velocity is 1.75 m/s, and  with an assumed right atrial pressure of 10 mmHg, the estimated right ventricular  systolic pressure is 48.1 mmHg. Left Atrium: Left atrial size was normal in size. Right Atrium: Right atrial size was normal in size. Pericardium: There is no evidence of pericardial effusion. Mitral Valve: The mitral valve is normal in structure and function. Normal mobility of the mitral valve leaflets. Trivial mitral valve regurgitation. No evidence of mitral valve stenosis. Tricuspid Valve: The tricuspid valve is normal in structure. Tricuspid valve regurgitation is trivial. No evidence of tricuspid stenosis. Aortic Valve: The aortic valve is normal in structure and function. Aortic valve regurgitation is not visualized. No aortic stenosis is present. Aortic valve mean gradient measures 4.3 mmHg. Aortic valve peak gradient measures 7.8 mmHg. Aortic valve area, by VTI measures 1.48 cm. Pulmonic Valve: The pulmonic valve was normal in structure. Pulmonic valve regurgitation is not visualized. No evidence of pulmonic stenosis. Aorta: The aortic root is normal in size and structure. Venous: The inferior vena cava is normal in size with greater than 50% respiratory variability, suggesting right atrial pressure of 3 mmHg. IAS/Shunts: No atrial level shunt detected by color flow Doppler.  LEFT VENTRICLE PLAX 2D LVIDd:         4.58 cm LVIDs:         2.91 cm LV PW:         1.33 cm LV IVS:        0.97 cm LVOT diam:     2.00 cm LV SV:         43.67 ml LV SV Index:   36.38 LVOT Area:     3.14 cm  LEFT ATRIUM         Index LA diam:    4.30 cm 2.51 cm/m  AORTIC VALVE                   PULMONIC VALVE AV Area (Vmax):    1.60 cm    PV Vmax:        0.90 m/s AV Area (Vmean):   1.24 cm    PV Peak grad:   3.3 mmHg AV Area (VTI):     1.48 cm    RVOT Peak grad: 3 mmHg AV Vmax:           139.33 cm/s AV Vmean:          96.633 cm/s AV VTI:            0.295 m AV Peak Grad:      7.8 mmHg AV Mean Grad:      4.3 mmHg LVOT Vmax:         70.90 cm/s LVOT Vmean:        38.100 cm/s LVOT VTI:  0.139 m LVOT/AV VTI ratio: 0.47  AORTA  Ao Root diam: 2.50 cm MITRAL VALVE               TRICUSPID VALVE MV Area (PHT): 3.60 cm    TR Peak grad:   12.2 mmHg MV Decel Time: 211 msec    TR Vmax:        175.00 cm/s MV E velocity: 72.30 cm/s MV A velocity: 91.20 cm/s  SHUNTS MV E/A ratio:  0.79        Systemic VTI:  0.14 m                            Systemic Diam: 2.00 cm Neoma Laming MD Electronically signed by Neoma Laming MD Signature Date/Time: 10/29/2019/2:19:33 PM    Final      Medications:   . sodium chloride 50 mL/hr at 10/30/19 1050  . dialysis solution 1.5% low-MG/low-CA    . remdesivir 100 mg in NS 100 mL Stopped (10/30/19 0929)   . aspirin EC  81 mg Oral Daily  . atorvastatin  40 mg Oral q1800  . calcitRIOL  0.25 mcg Oral Daily  . calcium acetate  1,334 mg Oral TID WC  . carvedilol  25 mg Oral BID  . cloNIDine  0.1 mg Oral BID  . clopidogrel  75 mg Oral Daily  . dexamethasone  6 mg Oral Daily  . doxazosin  2 mg Oral QHS  . fluticasone  2 spray Each Nare Daily  . furosemide  80 mg Oral Daily  . gentamicin cream  1 application Topical Daily  . heparin injection (subcutaneous)  5,000 Units Subcutaneous Q12H  . insulin aspart  0-9 Units Subcutaneous TID PC & HS  . insulin glargine  20 Units Subcutaneous Daily  . irbesartan  37.5 mg Oral Daily  . Vitamin D (Ergocalciferol)  50,000 Units Oral Q7 days   acetaminophen **OR** acetaminophen, labetalol, magnesium hydroxide, ondansetron **OR** ondansetron (ZOFRAN) IV, traZODone  Assessment/ Plan:  64 y.o. female with past medical history of ESRD on PD, diabetes mellitus type 2, hyperlipidemia, hypertension, peripheral vascular disease, anemia of chronic kidney disease, secondary hyperparathyroidism who was admitted with syncope and COVID-19 infection.  1.  ESRD on peritoneal dialysis.  We plan to resume peritoneal dialysis tonight.  Orders have been prepared.  Continue 2.5 L fill volume with 4 exchanges.  2.  Hypertension.  Continue carvedilol, clonidine, doxazosin, and  irbesartan.  3.  Anemia of chronic kidney disease.  Patient receives Epogen as an outpatient.  Continue to monitor CBC.  4.  COVID-19 infection.  Treatment as per primary team.   LOS: 1 Marycarmen Hagey 2/17/20213:26 PM

## 2019-10-30 NOTE — Progress Notes (Signed)
[facility] [place] NAME: Tracy Jennings    MR#:  [id number]  DATE OF BIRTH:  [birth year]  SUBJECTIVE:  No new issues. another peritoneal dialysis session planned for tonight per nephrology  Low grade fever, T-max 99.9.  Blood pressure up REVIEW OF SYSTEMS:   Review of Systems  Constitutional: Positive for fever and malaise/fatigue. Negative for chills and weight loss.  HENT: Negative for ear discharge, ear pain and nosebleeds.   Eyes: Negative for blurred vision, pain and discharge.  Respiratory: Positive for cough. Negative for sputum production, shortness of breath, wheezing and stridor.   Cardiovascular: Negative for chest pain, palpitations, orthopnea and PND.  Gastrointestinal: Negative for abdominal pain, diarrhea, nausea and vomiting.  Genitourinary: Negative for frequency and urgency.  Musculoskeletal: Negative for back pain and joint pain.  Neurological: Positive for weakness. Negative for sensory change, speech change and focal weakness.  Psychiatric/Behavioral: Negative for depression and hallucinations. The patient is not nervous/anxious.    Tolerating Diet:no Tolerating PT: not needed  DRUG ALLERGIES:   Allergies  Allergen Reactions  . Amlodipine Swelling    Knees down to ankles Knees down to ankles  . Hctz [Hydrochlorothiazide]     pancreatitis  . Other Other (See Comments)    Reports cardiac arrest when given during surgery Reports cardiac arrest when given during surgery Reports cardiac arrest when given during surgery   . Amlodipine Besylate Swelling  . Sulfa Antibiotics Rash  . Dairy Aid [Lactase]     Runny nose  . Hydralazine Nausea Only    VITALS:  Blood pressure (!) 166/54, pulse 74, temperature 99.9 F (37.7 C), resp. rate 16, height 5\' 3"  (1.6 m), weight 65.9 kg, SpO2 98 %.  PHYSICAL EXAMINATION:   Physical Exam  GENERAL:  64 y.o.-year-old patient lying in the bed with no acute distress.  EYES:  Pupils equal, round, reactive to light and accommodation. No scleral icterus.   HEENT: Head atraumatic, normocephalic. Oropharynx and nasopharynx clear.  NECK:  Supple, no jugular venous distention. No thyroid enlargement, no tenderness.  LUNGS: Normal breath sounds bilaterally, no wheezing, rales, rhonchi. No use of accessory muscles of respiration.  CARDIOVASCULAR: S1, S2 normal. No murmurs, rubs, or gallops.  ABDOMEN: Soft, nontender, nondistended. Bowel sounds present. No organomegaly or mass. PD cath + EXTREMITIES: No cyanosis, clubbing or edema b/l.    NEUROLOGIC: Cranial nerves II through XII are intact. No focal Motor or sensory deficits b/l.   PSYCHIATRIC:  patient is alert and oriented x 3.  SKIN: No obvious rash, lesion, or ulcer.   LABORATORY PANEL:  CBC Recent Labs  Lab 10/29/19 0629  WBC 8.1  HGB 9.9*  HCT 30.2*  PLT 267    Chemistries  Recent Labs  Lab 10/29/19 0629  NA 135  K 4.2  CL 88*  CO2 18*  GLUCOSE 58*  BUN 75*  CREATININE 20.72*  CALCIUM 6.3*  AST 24  ALT 13  ALKPHOS 31*  BILITOT 0.9   Cardiac Enzymes No results for input(s): TROPONINI in the last 168 hours. RADIOLOGY:  CT Head Wo Contrast  Result Date: 10/28/2019 CLINICAL DATA:  [place], altered level of consciousness, headache EXAM: CT HEAD WITHOUT CONTRAST TECHNIQUE: Contiguous axial images were obtained from the base of the skull through the vertex without intravenous contrast. COMPARISON:  05/16/2018 FINDINGS: Brain: No acute infarct or hemorrhage. Lateral ventricles and midline structures appear unremarkable. No acute extra-axial fluid collections. No mass effect. Vascular: No hyperdense vessel  or unexpected calcification. Skull: Normal. Negative for fracture or focal lesion. Sinuses/Orbits: No acute finding. Other: None IMPRESSION: 1. Stable head CT, no acute process. Electronically Signed   By: Randa Ngo M.D.   On: 10/28/2019 21:26   CT Cervical Spine Wo Contrast  Result Date:  10/28/2019 CLINICAL DATA:  [place], altered level of consciousness EXAM: CT CERVICAL SPINE WITHOUT CONTRAST TECHNIQUE: Multidetector CT imaging of the cervical spine was performed without intravenous contrast. Multiplanar CT image reconstructions were also generated. COMPARISON:  None. FINDINGS: Alignment: Cervical vertebral bodies are in grossly anatomic alignment. Skull base and vertebrae: There are no acute displaced fractures. Soft tissues and spinal canal: No prevertebral fluid or swelling. No visible canal hematoma. Disc levels: Multilevel cervical spondylosis is identified, most pronounced at the C5-6, C6-7, and C7/T1 levels. There is disc space narrowing, circumferential disc osteophyte complex, and uncovertebral hypertrophy. Mild symmetrical neural foraminal encroachment at C5/C6. Upper chest: Central airways patent. Scattered pleural and parenchymal scarring at the lung apices. Other: Reconstructed images demonstrate no additional findings. IMPRESSION: 1. Multilevel cervical spondylosis.  No acute fractures. Electronically Signed   By: Randa Ngo M.D.   On: 10/28/2019 21:28   US Carotid Bilateral  Result Date: 10/29/2019 CLINICAL DATA:  Syncope EXAM: BILATERAL CAROTID DUPLEX ULTRASOUND TECHNIQUE: Pearline Cables scale imaging, color Doppler and duplex ultrasound were performed of bilateral carotid and vertebral arteries in the neck. Technologist describes technically difficult study secondary to lack of patient cooperation, and vessel movement with respirations. COMPARISON:  None. FINDINGS: Criteria: Quantification of carotid stenosis is based on velocity parameters that correlate the residual internal carotid diameter with NASCET-based stenosis levels, using the diameter of the distal internal carotid lumen as the denominator for stenosis measurement. The following velocity measurements were obtained: RIGHT ICA: 161/29 cm/sec CCA: 71/24 cm/sec SYSTOLIC ICA/CCA RATIO:  1.7 ECA: 150 cm/sec LEFT ICA: 90/29 cm/sec  CCA: 580/99 cm/sec SYSTOLIC ICA/CCA RATIO:  0.8 ECA: 112 cm/sec RIGHT CAROTID ARTERY: Smooth partially calcified plaque in the bulb and proximal ICA. No high-grade stenosis. Normal waveforms and color Doppler signal. Mild tortuosity. The measured elevated peak systolic velocities are probably overestimated due to poor angle correction. RIGHT VERTEBRAL ARTERY:  Normal flow direction and waveform. LEFT CAROTID ARTERY: Mild tortuosity. Calcified plaque at the carotid bifurcation and proximal ICA. No high-grade stenosis. Normal waveforms and color Doppler signal. LEFT VERTEBRAL ARTERY:  Normal flow direction and waveform. IMPRESSION: 1. Bilateral carotid bifurcation plaque resulting in less than 50% diameter ICA stenosis. 2. Antegrade bilateral vertebral arterial flow. Electronically Signed   By: Lucrezia Europe M.D.   On: 10/29/2019 13:43   DG Chest Port 1 View  Result Date: 10/29/2019 CLINICAL DATA:  COVID positive. EXAM: PORTABLE CHEST 1 VIEW COMPARISON:  05/20/2018 FINDINGS: 3:22 p.m. Patchy bilateral airspace disease noted, right greater than left. The cardiopericardial silhouette is within normal limits for size. The visualized bony structures of the thorax are intact. Telemetry leads overlie the chest. IMPRESSION: Patchy bilateral airspace opacity consistent with multifocal pneumonia. Electronically Signed   By: Misty Stanley M.D.   On: 10/29/2019 17:22   ECHOCARDIOGRAM COMPLETE  Result Date: 10/29/2019    ECHOCARDIOGRAM REPORT   Patient Name:   Tracy Jennings Date of Exam: 10/29/2019 Medical Rec #:  [id number]        Height:       63.0 in Accession #:    [id number]       Weight:       150.0 lb Date of Birth:  September 14, 1955  BSA:          1.71 m Patient Age:    70 years         BP:           188/71 mmHg Patient Gender: F                HR:           82 bpm. Exam Location:  [facility] Procedure: 2D Echo, Cardiac Doppler and Color Doppler Indications:     Syncope 780.2  History:         Patient has prior history of  Echocardiogram examinations, most                  recent 05/12/2018. Signs/Symptoms:Murmur; Risk                  Factors:Hypertension and Diabetes. PVD.  Sonographer:     Sherrie Sport RDCS (AE) Referring Phys:  [id number] [place] Diagnosing Phys: Neoma Laming MD IMPRESSIONS  1. Left ventricular ejection fraction, by estimation, is 60 to 65%. The left ventricle has normal function. The left ventricle has no regional wall motion abnormalities. There is moderate concentric left ventricular hypertrophy. Left ventricular diastolic parameters are consistent with Grade I diastolic dysfunction (impaired relaxation).  2. Right ventricular systolic function is normal. The right ventricular size is normal. There is normal pulmonary artery systolic pressure.  3. The mitral valve is normal in structure and function. Trivial mitral valve regurgitation. No evidence of mitral stenosis.  4. The aortic valve is normal in structure and function. Aortic valve regurgitation is not visualized. No aortic stenosis is present.  5. The inferior vena cava is normal in size with greater than 50% respiratory variability, suggesting right atrial pressure of 3 mmHg. FINDINGS  Left Ventricle: Left ventricular ejection fraction, by estimation, is 60 to 65%. The left ventricle has normal function. The left ventricle has no regional wall motion abnormalities. The left ventricular internal cavity size was normal in size. There is  moderate concentric left ventricular hypertrophy. Left ventricular diastolic parameters are consistent with Grade I diastolic dysfunction (impaired relaxation). Right Ventricle: The right ventricular size is normal. No increase in right ventricular wall thickness. Right ventricular systolic function is normal. There is normal pulmonary artery systolic pressure. The tricuspid regurgitant velocity is 1.75 m/s, and  with an assumed right atrial pressure of 10 mmHg, the estimated right ventricular systolic pressure is 54.0 mmHg.  Left Atrium: Left atrial size was normal in size. Right Atrium: Right atrial size was normal in size. Pericardium: There is no evidence of pericardial effusion. Mitral Valve: The mitral valve is normal in structure and function. Normal mobility of the mitral valve leaflets. Trivial mitral valve regurgitation. No evidence of mitral valve stenosis. Tricuspid Valve: The tricuspid valve is normal in structure. Tricuspid valve regurgitation is trivial. No evidence of tricuspid stenosis. Aortic Valve: The aortic valve is normal in structure and function. Aortic valve regurgitation is not visualized. No aortic stenosis is present. Aortic valve mean gradient measures 4.3 mmHg. Aortic valve peak gradient measures 7.8 mmHg. Aortic valve area, by VTI measures 1.48 cm. Pulmonic Valve: The pulmonic valve was normal in structure. Pulmonic valve regurgitation is not visualized. No evidence of pulmonic stenosis. Aorta: The aortic root is normal in size and structure. Venous: The inferior vena cava is normal in size with greater than 50% respiratory variability, suggesting right atrial pressure of 3 mmHg. IAS/Shunts: No atrial level shunt detected by color  flow Doppler.  LEFT VENTRICLE PLAX 2D LVIDd:         4.58 cm LVIDs:         2.91 cm LV PW:         1.33 cm LV IVS:        0.97 cm LVOT diam:     2.00 cm LV SV:         43.67 ml LV SV Index:   36.38 LVOT Area:     3.14 cm  LEFT ATRIUM         Index LA diam:    4.30 cm 2.51 cm/m  AORTIC VALVE                   PULMONIC VALVE AV Area (Vmax):    1.60 cm    PV Vmax:        0.90 m/s AV Area (Vmean):   1.24 cm    PV Peak grad:   3.3 mmHg AV Area (VTI):     1.48 cm    RVOT Peak grad: 3 mmHg AV Vmax:           139.33 cm/s AV Vmean:          96.633 cm/s AV VTI:            0.295 m AV Peak Grad:      7.8 mmHg AV Mean Grad:      4.3 mmHg LVOT Vmax:         70.90 cm/s LVOT Vmean:        38.100 cm/s LVOT VTI:          0.139 m LVOT/AV VTI ratio: 0.47  AORTA Ao Root diam: 2.50 cm MITRAL  VALVE               TRICUSPID VALVE MV Area (PHT): 3.60 cm    TR Peak grad:   12.2 mmHg MV Decel Time: 211 msec    TR Vmax:        175.00 cm/s MV E velocity: 72.30 cm/s MV A velocity: 91.20 cm/s  SHUNTS MV E/A ratio:  0.79        Systemic VTI:  0.14 m                            Systemic Diam: 2.00 cm Neoma Laming MD Electronically signed by Neoma Laming MD Signature Date/Time: 10/29/2019/2:19:33 PM    Final    ASSESSMENT AND PLAN:  Tracy Jennings  is a 65 y.o. female with a known history of end-stage renal disease on peritoneal dialysis, hypertension, type diabetes mellitus and peripheral vascular disease, who presented to the emergency room with acute onset of syncope.  She was last seen around 7 PM on 2/15 by her husband who later went to the store and when he came back he found her on the ground. She admits to intermittent diarrhea over the last couple weeks   1. Syncope. -etiology unclear  CT head negative CT cervical spine--multilevel DJD - carotid Doppler negative for high grade stenosis -2D echo EF of 60 to 65%. -gentle hydrated with IV normal saline remains in sinus rhythm  2.  Hypertensive urgency.  -Continue Coreg, Cardura, Lasix, Avapro, as needed labetalol -Increase clonidine dose to 0.2 mg today  3.End-stage renal disease on peritoneal dialysis. -  Nephrology Dr. Anthonette Legato following for dialysis needs  4. COVID-19 Pneumonia.  She currently has normal pulse oximetry.  She  has been having symptoms including weakness, fatigue, body aches and diarrhea as well as cough.  -on IV Remdesivir (3/5).   -CRP 5.8 -sats >92 %on RA -CXR ordered--not done in ER--shows right side infiltrate -po decadron  5.Type 2 diabetes mellitus.  She will be placed on supplement coverage with NovoLog and will continue basal coverage. -Insulin Lantus 20 units qd--ad just according to sugars  6.DVT prophylaxis.  Subcut  Heparin.  Procedures:none Family communication : Talked with patient  directly  consults :nephrology Discharge Disposition :home once remdesivir is completed possibly on Friday on 2/19 CODE STATUS: full DVT Prophylaxis :heparin Barriers to discharge:none  TOTAL TIME TAKING CARE OF THIS PATIENT: 25 minutes.  >50% time spent on counselling and coordination of care  Note: This dictation was prepared with Dragon dictation along with smaller phrase technology. Any transcriptional errors that result from this process are unintentional.  Max Sane M.D    [facility]   CC: Primary care physician; Ricardo Jericho, NPPatient ID: Tracy Jennings, female   DOB: 1955/09/18, 64 y.o.   MRN: [id number]

## 2019-10-31 DIAGNOSIS — I160 Hypertensive urgency: Secondary | ICD-10-CM

## 2019-10-31 DIAGNOSIS — I953 Hypotension of hemodialysis: Secondary | ICD-10-CM

## 2019-10-31 LAB — GLUCOSE, CAPILLARY
Glucose-Capillary: 277 mg/dL — ABNORMAL HIGH (ref 70–99)
Glucose-Capillary: 228 mg/dL — ABNORMAL HIGH (ref 70–99)
Glucose-Capillary: 214 mg/dL — ABNORMAL HIGH (ref 70–99)
Glucose-Capillary: 128 mg/dL — ABNORMAL HIGH (ref 70–99)
Glucose-Capillary: 207 mg/dL — ABNORMAL HIGH (ref 70–99)

## 2019-10-31 MED ORDER — FUROSEMIDE 40 MG PO TABS
80.0000 mg | ORAL_TABLET | Freq: Two times a day (BID) | ORAL | Status: DC
  Administered 2019-10-31 – 2019-11-01 (×3): 80 mg via ORAL
  Filled 2019-10-31 (×3): qty 2

## 2019-10-31 MED ORDER — IRBESARTAN 150 MG PO TABS
75.0000 mg | ORAL_TABLET | Freq: Every day | ORAL | Status: DC
  Administered 2019-10-31 – 2019-11-01 (×2): 75 mg via ORAL
  Filled 2019-10-31 (×2): qty 1

## 2019-10-31 MED ORDER — LABETALOL HCL 5 MG/ML IV SOLN: 20.0000 mg | INTRAVENOUS | Status: DC | PRN

## 2019-10-31 MED ORDER — ASPIRIN 81 MG PO CHEW
81.0000 mg | CHEWABLE_TABLET | Freq: Every day | ORAL | Status: DC
  Administered 2019-10-31 – 2019-11-01 (×2): 81 mg via ORAL
  Filled 2019-10-31 (×2): qty 1

## 2019-10-31 NOTE — Progress Notes (Signed)
Pd completed 

## 2019-10-31 NOTE — Evaluation (Addendum)
Physical Therapy Evaluation Patient Details Name: Tracy Jennings MRN: [id number] DOB: June 05, 1956 Today's Date: 10/31/2019   History of Present Illness  Tracy  Jennings is a 46yoF who comes to [facility] 2/15 s/p episode of syncope adn collapse to floor. Pt (+) COVID 19. PMH: end-stage renal disease on peritoneal dialysis, hypertension, type diabetes mellitus and peripheral vascular disease.  Clinical Impression  Pt admitted with above diagnosis. Pt currently with functional limitations due to the deficits listed below (see "PT Problem List"). Per [facility]: last available Ca++ lab: 6.3 from 2 days ago, below cutoff for PT, cleared by attending for PT services. Upon entry, pt seated EOB, awake and agreeable to participate. The pt is alert and oriented x4, pleasant, conversational, and generally a good historian. Pt moving generally well, no assist needed, but tolerates less than 30sec standing prior to onset concerning dizziness. BP 230s SPB supine, 160s SPB seated, 100s SBP standing. HR flat in low 70s. Functional mobility assessment demonstrates increased effort/time requirements, poor tolerance, and need for physical assistance, whereas the patient performed these at a higher level of independence PTA. Additional mobility deferred until BP more stable. Pt will benefit from skilled PT intervention to increase independence and safety with basic mobility in preparation for discharge to the venue listed below.       Follow Up Recommendations Home health PT    Equipment Recommendations  None recommended by PT    Recommendations for Other Services       Precautions / Restrictions Precautions Precautions: Fall Restrictions Weight Bearing Restrictions: No      Mobility  Bed Mobility Overal bed mobility: Independent                Transfers Overall transfer level: Needs assistance Equipment used: None;1 person hand held assist Transfers: Sit to/from Stand;Stand Pivot Transfers Sit to Stand:  Independent Stand pivot transfers: Min guard          Ambulation/Gait Ambulation/Gait assistance: (deferred d/t orthostatic BP)              Stairs            Wheelchair Mobility    Modified Rankin (Stroke Patients Only)       Balance Overall balance assessment: Modified Independent                                           Pertinent Vitals/Pain Pain Assessment: No/denies pain    Home Living Family/patient expects to be discharged to:: Private residence Living Arrangements: Spouse/significant other Available Help at Discharge: Family(husband works, but can be available 24/7 at [place].) Type of Home: House Home Access: Stairs to enter Entrance Stairs-Rails: Can reach both;Left;Right Entrance Stairs-Number of Steps: 6 Home Layout: One level Home Equipment: Walker - 2 wheels;Shower seat;Hand held shower head      Prior Function Level of Independence: Independent               Hand Dominance        Extremity/Trunk Assessment   Upper Extremity Assessment Upper Extremity Assessment: Overall WFL for tasks assessed    Lower Extremity Assessment Lower Extremity Assessment: Overall WFL for tasks assessed       Communication      Cognition Arousal/Alertness: Awake/alert Behavior During Therapy: WFL for tasks assessed/performed Overall Cognitive Status: Within Functional Limits for tasks assessed  General Comments      Exercises     Assessment/Plan    PT Assessment Patient needs continued PT services  PT Problem List Decreased activity tolerance;Decreased balance;Decreased mobility       PT Treatment Interventions DME instruction;Gait training;Functional mobility training;Therapeutic activities;Therapeutic exercise;Patient/family education    PT Goals (Current goals can be found in the Care Plan section)  Acute Rehab PT Goals Patient Stated Goal: abate  dizziness PT Goal Formulation: With patient Time For Goal Achievement: 11/14/19 Potential to Achieve Goals: Good    Frequency Min 2X/week   Barriers to discharge        Co-evaluation               AM-PAC PT "6 Clicks" Mobility  Outcome Measure Help needed turning from your back to your side while in a flat bed without using bedrails?: None Help needed moving from lying on your back to sitting on the side of a flat bed without using bedrails?: None Help needed moving to and from a bed to a chair (including a wheelchair)?: A Little Help needed standing up from a chair using your arms (e.g., wheelchair or bedside chair)?: A Little Help needed to walk in hospital room?: A Little Help needed climbing 3-5 steps with a railing? : A Little 6 Click Score: 20    End of Session   Activity Tolerance: Treatment limited secondary to medical complications (Comment) Patient left: in chair;with call bell/phone within reach Nurse Communication: Mobility status PT Visit Diagnosis: Unsteadiness on feet (R26.81);Other abnormalities of gait and mobility (R26.89);Other symptoms and signs involving the nervous system (R29.898);Dizziness and giddiness (R42)    Time: 4580-9983 PT Time Calculation (min) (ACUTE ONLY): 28 min   Charges:   PT Evaluation $PT Eval Moderate Complexity: 1 Mod         4:03 PM, 10/31/19 Etta Grandchild, PT, DPT Physical Therapist - [facility]  [phone] ([place])   Paia C 10/31/2019, 3:58 PM

## 2019-10-31 NOTE — Progress Notes (Signed)
Pt appears to have tolerated clonidine well and reports no adverse effects. Other medications provided to patient.   Pt asking when she will be disconnected from PD machine as cycle is now complete. Pt reports cycle finished overnight. Call to dialysis unit finds no RN available to disconnect patient at this time. Dialysis RN to report to bedside when available.

## 2019-10-31 NOTE — Progress Notes (Signed)
[facility]  ROUNDING NOTE   Subjective:  Pt seen at bedside. Having high BPs in supine position but otherwise orthostatic.  Tolerating PD well.   Objective:  Vital signs in last 24 hours:  Temp:  [99.9 F (37.7 C)] 99.9 F (37.7 C) (02/17 2100) Pulse Rate:  [69-76] 72 (02/18 1533) Resp:  [8-18] 17 (02/18 1533) BP: (152-236)/(72-110) 152/76 (02/18 1527) SpO2:  [97 %-100 %] 100 % (02/18 1533)  Weight change:  Filed Weights   10/28/19 2104 10/30/19 0500  Weight: 68 kg 65.9 kg    Intake/Output: I/O last 3 completed shifts: In: 1277 [P.O.:480; I.V.:697; IV Piggyback:100] Out: -    Intake/Output this shift:  Total I/O In: 0  Out: 15 [Other:15]  Physical Exam: General: No acute distress  Head: Normocephalic, atraumatic. Moist oral mucosal membranes  Eyes: Anicteric  Neck: Supple, trachea midline  Lungs:  Nonlabored breathing  Heart: No rubs  Abdomen:  Soft, nontender  Extremities: trace peripheral edema.  Neurologic: Awake, alert, following commands  Skin: No lesions  Access: Peritoneal dialysis catheter in place    Basic Metabolic Panel: Recent Labs  Lab 10/28/19 2055 10/29/19 0629  NA 136 135  K 4.7 4.2  CL 90* 88*  CO2 20* 18*  GLUCOSE 108* 58*  BUN 68* 75*  CREATININE 20.43* 20.72*  CALCIUM 6.6* 6.3*    Liver Function Tests: Recent Labs  Lab 10/28/19 2055 10/29/19 0629  AST 26 24  ALT 16 13  ALKPHOS 35* 31*  BILITOT 0.9 0.9  PROT 6.6 6.6  ALBUMIN 2.6* 2.7*   No results for input(s): LIPASE, AMYLASE in the last 168 hours. No results for input(s): AMMONIA in the last 168 hours.  CBC: Recent Labs  Lab 10/28/19 2055 10/29/19 0629  WBC 9.6 8.1  NEUTROABS 8.8*  --   HGB 10.4* 9.9*  HCT 32.1* 30.2*  MCV 89.9 89.3  PLT 252 267    Cardiac Enzymes: No results for input(s): CKTOTAL, CKMB, CKMBINDEX, TROPONINI in the last 168 hours.  BNP: Invalid input(s): POCBNP  CBG: Recent Labs  Lab 10/30/19 1637 10/30/19 2055  10/31/19 0328 10/31/19 0757 10/31/19 1101  GLUCAP 163* 186* 277* 228* 214*    Microbiology: Results for orders placed or performed during the hospital encounter of 10/28/19  Respiratory Panel by RT PCR (Flu A&B, Covid) - Nasopharyngeal Swab     Status: Abnormal   Collection Time: 10/28/19  9:38 PM   Specimen: Nasopharyngeal Swab  Result Value Ref Range Status   SARS Coronavirus 2 by RT PCR POSITIVE (A) NEGATIVE Final    Comment: RESULT CALLED TO, READ BACK BY AND VERIFIED WITH: Charolotte Capuchin RN [id number] 10/28/19 HNM (NOTE) SARS-CoV-2 target nucleic acids are DETECTED. SARS-CoV-2 RNA is generally detectable in upper respiratory specimens  during the acute phase of infection. Positive results are indicative of the presence of the identified virus, but do not rule out bacterial infection or co-infection with other pathogens not detected by the test. Clinical correlation with patient history and other diagnostic information is necessary to determine patient infection status. The expected result is Negative. Fact Sheet for Patients:  PinkCheek.be Fact Sheet for Healthcare Providers: GravelBags.it This test is not yet approved or cleared by the [place] FDA and  has been authorized for detection and/or diagnosis of SARS-CoV-2 by FDA under an Emergency Use Authorization (EUA).  This EUA will remain in effect (meaning this test can be used) f or the duration of  the COVID-19 declaration under Section  564(b)(1) of the Act, 21 U.S.C. section 360bbb-3(b)(1), unless the authorization is terminated or revoked sooner.    Influenza A by PCR NEGATIVE NEGATIVE Final   Influenza B by PCR NEGATIVE NEGATIVE Final    Comment: (NOTE) The Xpert Xpress SARS-CoV-2/FLU/RSV assay is intended as an aid in  the diagnosis of influenza from Nasopharyngeal swab specimens and  should not be used as a sole basis for treatment. Nasal washings and   aspirates are unacceptable for Xpert Xpress SARS-CoV-2/FLU/RSV  testing. Fact Sheet for Patients: PinkCheek.be Fact Sheet for Healthcare Providers: GravelBags.it This test is not yet approved or cleared by the [place] FDA and  has been authorized for detection and/or diagnosis of SARS-CoV-2 by  FDA under an Emergency Use Authorization (EUA). This EUA will remain  in effect (meaning this test can be used) for the duration of the  Covid-19 declaration under Section 564(b)(1) of the Act, 21  U.S.C. section 360bbb-3(b)(1), unless the authorization is  terminated or revoked. Performed at [facility], [address] [place], [place] [address]     Coagulation Studies: No results for input(s): LABPROT, INR in the last 72 hours.  Urinalysis: No results for input(s): COLORURINE, LABSPEC, PHURINE, GLUCOSEU, HGBUR, BILIRUBINUR, KETONESUR, PROTEINUR, UROBILINOGEN, NITRITE, LEUKOCYTESUR in the last 72 hours.  Invalid input(s): APPERANCEUR    Imaging: No results found.   Medications:   . sodium chloride 50 mL/hr at 10/30/19 1050  . dialysis solution 1.5% low-MG/low-CA    . remdesivir 100 mg in NS 100 mL 100 mg (10/31/19 0927)   . aspirin  81 mg Oral Daily  . atorvastatin  40 mg Oral q1800  . calcitRIOL  0.25 mcg Oral Daily  . calcium acetate  1,334 mg Oral TID WC  . carvedilol  25 mg Oral BID  . cloNIDine  0.2 mg Oral BID  . clopidogrel  75 mg Oral Daily  . dexamethasone  6 mg Oral Daily  . doxazosin  2 mg Oral QHS  . fluticasone  2 spray Each Nare Daily  . furosemide  80 mg Oral BID  . gentamicin cream  1 application Topical Daily  . insulin aspart  0-9 Units Subcutaneous TID PC & HS  . insulin glargine  20 Units Subcutaneous Daily  . irbesartan  75 mg Oral Daily  . Vitamin D (Ergocalciferol)  50,000 Units Oral Q7 days   acetaminophen **OR** acetaminophen, labetalol, magnesium hydroxide, ondansetron  **OR** ondansetron (ZOFRAN) IV, traZODone  Assessment/ Plan:  64 y.o. female with past medical history of ESRD on PD, diabetes mellitus type 2, hyperlipidemia, hypertension, peripheral vascular disease, anemia of chronic kidney disease, secondary hyperparathyroidism who was admitted with syncope and COVID-19 infection.  1.  ESRD on peritoneal dialysis.  Continue CCPD with current orders.  2.  Hypertension.  Challenging to manage as the patient was orthostatic when she stands.  We will likely need to tolerate the highs so that she is not symptomatic when she stands.  Continue carvedilol, clonidine, doxazosin, and irbesartan.  3.  Anemia of chronic kidney disease.  Hemoglobin 9.9.  Continue to monitor.  4.  COVID-19 infection.  Patient being treated with remdesivir as well as dexamethasone.   LOS: 2 Xitlally Mooneyham 2/18/20213:53 PM

## 2019-10-31 NOTE — Progress Notes (Signed)
[facility] NAME: Tracy Jennings    MR#:  [id number]  DATE OF BIRTH:  1955-09-14  SUBJECTIVE:  Low grade fever, T-max 99.9.  Blood pressure very high from 190s to 200 this morning (highest 232/88), very orthostatic as well REVIEW OF SYSTEMS:   Review of Systems  Constitutional: Positive for malaise/fatigue. Negative for chills and weight loss.  HENT: Negative for ear discharge, ear pain and nosebleeds.   Eyes: Negative for blurred vision, pain and discharge.  Respiratory: Negative for sputum production, shortness of breath, wheezing and stridor.   Cardiovascular: Negative for chest pain, palpitations, orthopnea and PND.  Gastrointestinal: Negative for abdominal pain, diarrhea, nausea and vomiting.  Genitourinary: Negative for frequency and urgency.  Musculoskeletal: Negative for back pain and joint pain.  Neurological: Positive for weakness. Negative for sensory change, speech change and focal weakness.  Psychiatric/Behavioral: Negative for depression and hallucinations. The patient is not nervous/anxious.    Tolerating Diet:no Tolerating PT: not needed DRUG ALLERGIES:   Allergies  Allergen Reactions  . Amlodipine Swelling    Knees down to ankles Knees down to ankles  . Hctz [Hydrochlorothiazide]     pancreatitis  . Heparin Other (See Comments)    Pt reports cardiac arrest when given heparin  . Lmw Heparin     Reports cardiac arrest when given heparin  . Other Other (See Comments)    Reports cardiac arrest when given during surgery Reports cardiac arrest when given during surgery Reports cardiac arrest when given during surgery   . Amlodipine Besylate Swelling  . Sulfa Antibiotics Rash  . Dairy Aid [Lactase]     Runny nose  . Hydralazine Nausea Only    VITALS:  Blood pressure (!) 177/86, pulse 69, temperature 99.9 F (37.7 C), temperature source Oral, resp. rate 16, height 5\' 3"  (1.6 m), weight 65.9 kg, SpO2 100  %.  PHYSICAL EXAMINATION:   Physical Exam  GENERAL:  64 y.o.-year-old patient lying in the bed with no acute distress.  EYES: Pupils equal, round, reactive to light and accommodation. No scleral icterus.   HEENT: Head atraumatic, normocephalic. Oropharynx and nasopharynx clear.  NECK:  Supple, no jugular venous distention. No thyroid enlargement, no tenderness.  LUNGS: Normal breath sounds bilaterally, no wheezing, rales, rhonchi. No use of accessory muscles of respiration.  CARDIOVASCULAR: S1, S2 normal. No murmurs, rubs, or gallops.  ABDOMEN: Soft, nontender, nondistended. Bowel sounds present. No organomegaly or mass. PD cath + EXTREMITIES: No cyanosis, clubbing or edema b/l.    NEUROLOGIC: Cranial nerves II through XII are intact. No focal Motor or sensory deficits b/l.   PSYCHIATRIC:  patient is alert and oriented x 3.  SKIN: No obvious rash, lesion, or ulcer.   LABORATORY PANEL:  CBC Recent Labs  Lab 10/29/19 0629  WBC 8.1  HGB 9.9*  HCT 30.2*  PLT 267    Chemistries  Recent Labs  Lab 10/29/19 0629  NA 135  K 4.2  CL 88*  CO2 18*  GLUCOSE 58*  BUN 75*  CREATININE 20.72*  CALCIUM 6.3*  AST 24  ALT 13  ALKPHOS 31*  BILITOT 0.9   Cardiac Enzymes No results for input(s): TROPONINI in the last 168 hours. RADIOLOGY:  US Carotid Bilateral  Result Date: 10/29/2019 CLINICAL DATA:  Syncope EXAM: BILATERAL CAROTID DUPLEX ULTRASOUND TECHNIQUE: Pearline Cables scale imaging, color Doppler and duplex ultrasound were performed of bilateral carotid and vertebral arteries in the neck. Technologist describes technically difficult study secondary  to lack of patient cooperation, and vessel movement with respirations. COMPARISON:  None. FINDINGS: Criteria: Quantification of carotid stenosis is based on velocity parameters that correlate the residual internal carotid diameter with NASCET-based stenosis levels, using the diameter of the distal internal carotid lumen as the denominator for  stenosis measurement. The following velocity measurements were obtained: RIGHT ICA: 161/29 cm/sec CCA: 32/35 cm/sec SYSTOLIC ICA/CCA RATIO:  1.7 ECA: 150 cm/sec LEFT ICA: 90/29 cm/sec CCA: 573/22 cm/sec SYSTOLIC ICA/CCA RATIO:  0.8 ECA: 112 cm/sec RIGHT CAROTID ARTERY: Smooth partially calcified plaque in the bulb and proximal ICA. No high-grade stenosis. Normal waveforms and color Doppler signal. Mild tortuosity. The measured elevated peak systolic velocities are probably overestimated due to poor angle correction. RIGHT VERTEBRAL ARTERY:  Normal flow direction and waveform. LEFT CAROTID ARTERY: Mild tortuosity. Calcified plaque at the carotid bifurcation and proximal ICA. No high-grade stenosis. Normal waveforms and color Doppler signal. LEFT VERTEBRAL ARTERY:  Normal flow direction and waveform. IMPRESSION: 1. Bilateral carotid bifurcation plaque resulting in less than 50% diameter ICA stenosis. 2. Antegrade bilateral vertebral arterial flow. Electronically Signed   By: Lucrezia Europe M.D.   On: 10/29/2019 13:43   DG Chest Port 1 View  Result Date: 10/29/2019 CLINICAL DATA:  COVID positive. EXAM: PORTABLE CHEST 1 VIEW COMPARISON:  05/20/2018 FINDINGS: 3:22 p.m. Patchy bilateral airspace disease noted, right greater than left. The cardiopericardial silhouette is within normal limits for size. The visualized bony structures of the thorax are intact. Telemetry leads overlie the chest. IMPRESSION: Patchy bilateral airspace opacity consistent with multifocal pneumonia. Electronically Signed   By: Misty Stanley M.D.   On: 10/29/2019 17:22   ECHOCARDIOGRAM COMPLETE  Result Date: 10/29/2019    ECHOCARDIOGRAM REPORT   Patient Name:   Tracy Jennings Date of Exam: 10/29/2019 Medical Rec #:  [id number]        Height:       63.0 in Accession #:    [id number]       Weight:       150.0 lb Date of Birth:  April 27, 1956        BSA:          1.71 m Patient Age:    64 years         BP:           188/71 mmHg Patient Gender: F                 HR:           82 bpm. Exam Location:  [facility] Procedure: 2D Echo, Cardiac Doppler and Color Doppler Indications:     Syncope 780.2  History:         Patient has prior history of Echocardiogram examinations, most                  recent 05/12/2018. Signs/Symptoms:Murmur; Risk                  Factors:Hypertension and Diabetes. PVD.  Sonographer:     Sherrie Sport RDCS (AE) Referring Phys:  [id number] [place] Diagnosing Phys: Neoma Laming MD IMPRESSIONS  1. Left ventricular ejection fraction, by estimation, is 60 to 65%. The left ventricle has normal function. The left ventricle has no regional wall motion abnormalities. There is moderate concentric left ventricular hypertrophy. Left ventricular diastolic parameters are consistent with Grade I diastolic dysfunction (impaired relaxation).  2. Right ventricular systolic function is normal. The right ventricular size is normal. There is normal  pulmonary artery systolic pressure.  3. The mitral valve is normal in structure and function. Trivial mitral valve regurgitation. No evidence of mitral stenosis.  4. The aortic valve is normal in structure and function. Aortic valve regurgitation is not visualized. No aortic stenosis is present.  5. The inferior vena cava is normal in size with greater than 50% respiratory variability, suggesting right atrial pressure of 3 mmHg. FINDINGS  Left Ventricle: Left ventricular ejection fraction, by estimation, is 60 to 65%. The left ventricle has normal function. The left ventricle has no regional wall motion abnormalities. The left ventricular internal cavity size was normal in size. There is  moderate concentric left ventricular hypertrophy. Left ventricular diastolic parameters are consistent with Grade I diastolic dysfunction (impaired relaxation). Right Ventricle: The right ventricular size is normal. No increase in right ventricular wall thickness. Right ventricular systolic function is normal. There is normal pulmonary artery  systolic pressure. The tricuspid regurgitant velocity is 1.75 m/s, and  with an assumed right atrial pressure of 10 mmHg, the estimated right ventricular systolic pressure is 83.4 mmHg. Left Atrium: Left atrial size was normal in size. Right Atrium: Right atrial size was normal in size. Pericardium: There is no evidence of pericardial effusion. Mitral Valve: The mitral valve is normal in structure and function. Normal mobility of the mitral valve leaflets. Trivial mitral valve regurgitation. No evidence of mitral valve stenosis. Tricuspid Valve: The tricuspid valve is normal in structure. Tricuspid valve regurgitation is trivial. No evidence of tricuspid stenosis. Aortic Valve: The aortic valve is normal in structure and function. Aortic valve regurgitation is not visualized. No aortic stenosis is present. Aortic valve mean gradient measures 4.3 mmHg. Aortic valve peak gradient measures 7.8 mmHg. Aortic valve area, by VTI measures 1.48 cm. Pulmonic Valve: The pulmonic valve was normal in structure. Pulmonic valve regurgitation is not visualized. No evidence of pulmonic stenosis. Aorta: The aortic root is normal in size and structure. Venous: The inferior vena cava is normal in size with greater than 50% respiratory variability, suggesting right atrial pressure of 3 mmHg. IAS/Shunts: No atrial level shunt detected by color flow Doppler.  LEFT VENTRICLE PLAX 2D LVIDd:         4.58 cm LVIDs:         2.91 cm LV PW:         1.33 cm LV IVS:        0.97 cm LVOT diam:     2.00 cm LV SV:         43.67 ml LV SV Index:   36.38 LVOT Area:     3.14 cm  LEFT ATRIUM         Index LA diam:    4.30 cm 2.51 cm/m  AORTIC VALVE                   PULMONIC VALVE AV Area (Vmax):    1.60 cm    PV Vmax:        0.90 m/s AV Area (Vmean):   1.24 cm    PV Peak grad:   3.3 mmHg AV Area (VTI):     1.48 cm    RVOT Peak grad: 3 mmHg AV Vmax:           139.33 cm/s AV Vmean:          96.633 cm/s AV VTI:            0.295 m AV Peak Grad:      7.8  mmHg AV Mean Grad:      4.3 mmHg LVOT Vmax:         70.90 cm/s LVOT Vmean:        38.100 cm/s LVOT VTI:          0.139 m LVOT/AV VTI ratio: 0.47  AORTA Ao Root diam: 2.50 cm MITRAL VALVE               TRICUSPID VALVE MV Area (PHT): 3.60 cm    TR Peak grad:   12.2 mmHg MV Decel Time: 211 msec    TR Vmax:        175.00 cm/s MV E velocity: 72.30 cm/s MV A velocity: 91.20 cm/s  SHUNTS MV E/A ratio:  0.79        Systemic VTI:  0.14 m                            Systemic Diam: 2.00 cm Neoma Laming MD Electronically signed by Neoma Laming MD Signature Date/Time: 10/29/2019/2:19:33 PM    Final    ASSESSMENT AND PLAN:  Tracy Jennings  is a 64 y.o. female with a known history of end-stage renal disease on peritoneal dialysis, hypertension, type diabetes mellitus and peripheral vascular disease, who presented to the emergency room with acute onset of syncope.  She was last seen around 7 PM on 2/15 by her husband who later went to the store and when he came back he found her on the ground. She admits to intermittent diarrhea over the last couple weeks   1. Syncope. -Likely from orthostatic hypotension and uncontrolled blood pressure CT head negative CT cervical spine--multilevel DJD - carotid Doppler negative for high grade stenosis -2D echo EF of 60 to 65%.  2.  Hypertensive urgency.  -Continue clonidine at 0.2 mg p.o. twice daily, Cardura and Coreg.  As needed labetalol Increase the dose of Lasix, Avapro for better blood pressure control  3.End-stage renal disease on peritoneal dialysis. -  Nephrology Dr. Anthonette Legato following for catatonia dialysis needs  4. COVID-19 Pneumonia.  She currently has normal pulse oximetry.  She has been having symptoms including weakness, fatigue, body aches and diarrhea as well as cough.  -on IV Remdesivir (4/5).   -CRP 5.8 -sats >92 %on RA -CXR showed right side infiltrate -po decadron  5.Type 2 diabetes mellitus.  She will be placed on supplement coverage with  NovoLog and will continue basal coverage. -Insulin Lantus 20 units qd--ad just according to sugars  6.DVT prophylaxis.  Patient was on Subcut  Heparin.  But per nursing patient refused saying she is allergic to it.  I have discussed with patient and she was agreeable to take it.  For now we will hold off-she is at low risk for DVT.  Procedures:none Family communication : Talked with patient directly  consults :nephrology Discharge Disposition :home once clinically better CODE STATUS: full DVT Prophylaxis : None.  Patient refused heparin Barriers to discharge: Uncontrolled hypertension and orthostatic hypotension  TOTAL TIME TAKING CARE OF THIS PATIENT: 25 minutes.  >50% time spent on counselling and coordination of care  Note: This dictation was prepared with Dragon dictation along with smaller phrase technology. Any transcriptional errors that result from this process are unintentional.  Max Sane M.D    [facility]   CC: Primary care physician; Ricardo Jericho, NPPatient ID: Tracy Jennings, female   DOB: 07-Jun-1956, 64 y.o.   MRN: [id number]

## 2019-10-31 NOTE — Progress Notes (Signed)
Following message sent to MD:  pt had heparin listed as an allergy, says cardiac arrest is her "reaction" please DC medication for patient safety. Also noted increase in clonadine to 0.2mg . thank you. BP was excessively high overnight. Perhaps hydralazine would also be helpful or labetalol oral? Neither are on her allergy list.

## 2019-10-31 NOTE — Progress Notes (Signed)
Pt expressed reluctance for taking clonadine stating medication is too strong for her. Pt educated on effects of persistent hypertension on end organs and potential for stroke and told BP remained in 200s for most of the shift last night. Pt agreed to take clonadine alone without other medications to evaluate the effectiveness  And potential adverse effects of specific medication. Med given. Will CTM patient. Will recheck BP PRN.

## 2019-11-01 DIAGNOSIS — E872 Acidosis: Secondary | ICD-10-CM

## 2019-11-01 DIAGNOSIS — E8729 Other acidosis: Secondary | ICD-10-CM

## 2019-11-01 DIAGNOSIS — N186 End stage renal disease: Secondary | ICD-10-CM

## 2019-11-01 LAB — CBC
HCT: 33.2 % — ABNORMAL LOW (ref 36.0–46.0)
Hemoglobin: 10.6 g/dL — ABNORMAL LOW (ref 12.0–15.0)
MCH: 29 pg (ref 26.0–34.0)
MCHC: 31.9 g/dL (ref 30.0–36.0)
MCV: 90.7 fL (ref 80.0–100.0)
Platelets: 331 10*3/uL (ref 150–400)
RBC: 3.66 MIL/uL — ABNORMAL LOW (ref 3.87–5.11)
RDW: 15.2 % (ref 11.5–15.5)
WBC: 11.3 10*3/uL — ABNORMAL HIGH (ref 4.0–10.5)
nRBC: 0.4 % — ABNORMAL HIGH (ref 0.0–0.2)

## 2019-11-01 LAB — BASIC METABOLIC PANEL
Anion gap: 22 — ABNORMAL HIGH (ref 5–15)
BUN: 76 mg/dL — ABNORMAL HIGH (ref 8–23)
CO2: 19 mmol/L — ABNORMAL LOW (ref 22–32)
Calcium: 7.1 mg/dL — ABNORMAL LOW (ref 8.9–10.3)
Chloride: 92 mmol/L — ABNORMAL LOW (ref 98–111)
Creatinine, Ser: 15.3 mg/dL — ABNORMAL HIGH (ref 0.44–1.00)
GFR calc Af Amer: 3 mL/min — ABNORMAL LOW (ref >60)
GFR calc non Af Amer: 2 mL/min — ABNORMAL LOW (ref >60)
Glucose, Bld: 232 mg/dL — ABNORMAL HIGH (ref 70–99)
Potassium: 3.7 mmol/L (ref 3.5–5.1)
Sodium: 133 mmol/L — ABNORMAL LOW (ref 135–145)

## 2019-11-01 LAB — GLUCOSE, CAPILLARY
Glucose-Capillary: 227 mg/dL — ABNORMAL HIGH (ref 70–99)
Glucose-Capillary: 191 mg/dL — ABNORMAL HIGH (ref 70–99)
Glucose-Capillary: 142 mg/dL — ABNORMAL HIGH (ref 70–99)

## 2019-11-01 MED ORDER — CLONIDINE HCL 0.2 MG PO TABS: 0.2000 mg | ORAL_TABLET | Freq: Two times a day (BID) | ORAL | 11 refills | Status: DC

## 2019-11-01 MED ORDER — PREDNISONE 10 MG (21) PO TBPK: ORAL_TABLET | ORAL | 0 refills | Status: DC

## 2019-11-01 NOTE — Progress Notes (Signed)
[facility]  ROUNDING NOTE   Subjective:  Patient still with some orthostasis at times. Nursing instructed to check blood pressure in a seated position. Did well with peritoneal dialysis overnight.  Objective:  Vital signs in last 24 hours:  Temp:  [98.6 F (37 C)] 98.6 F (37 C) (02/19 0800) Pulse Rate:  [71-75] 75 (02/19 1208) Resp:  [16-22] 16 (02/19 0800) BP: (99-219)/(52-93) 130/76 (02/19 1208) SpO2:  [98 %-100 %] 98 % (02/19 1208) Weight:  [68.6 kg] 68.6 kg (02/19 0726)  Weight change:  Filed Weights   10/28/19 2104 10/30/19 0500 11/01/19 0726  Weight: 68 kg 65.9 kg 68.6 kg    Intake/Output: I/O last 3 completed shifts: In: 694.1 [P.O.:480; I.V.:114.1; IV Piggyback:100] Out: 15 [Other:15]   Intake/Output this shift:  No intake/output data recorded.  Physical Exam: General: No acute distress  Head: Normocephalic, atraumatic. Moist oral mucosal membranes  Eyes: Anicteric  Neck: Supple, trachea midline  Lungs:  Nonlabored breathing  Heart: No rubs  Abdomen:  Soft, nontender  Extremities: trace peripheral edema.  Neurologic: Awake, alert, following commands  Skin: No lesions  Access: Peritoneal dialysis catheter in place    Basic Metabolic Panel: Recent Labs  Lab 10/28/19 2055 10/29/19 0629 11/01/19 0400  NA 136 135 133*  K 4.7 4.2 3.7  CL 90* 88* 92*  CO2 20* 18* 19*  GLUCOSE 108* 58* 232*  BUN 68* 75* 76*  CREATININE 20.43* 20.72* 15.30*  CALCIUM 6.6* 6.3* 7.1*    Liver Function Tests: Recent Labs  Lab 10/28/19 2055 10/29/19 0629  AST 26 24  ALT 16 13  ALKPHOS 35* 31*  BILITOT 0.9 0.9  PROT 6.6 6.6  ALBUMIN 2.6* 2.7*   No results for input(s): LIPASE, AMYLASE in the last 168 hours. No results for input(s): AMMONIA in the last 168 hours.  CBC: Recent Labs  Lab 10/28/19 2055 10/29/19 0629 11/01/19 0400  WBC 9.6 8.1 11.3*  NEUTROABS 8.8*  --   --   HGB 10.4* 9.9* 10.6*  HCT 32.1* 30.2* 33.2*  MCV 89.9 89.3 90.7   PLT 252 267 331    Cardiac Enzymes: No results for input(s): CKTOTAL, CKMB, CKMBINDEX, TROPONINI in the last 168 hours.  BNP: Invalid input(s): POCBNP  CBG: Recent Labs  Lab 10/31/19 1604 10/31/19 2017 11/01/19 0344 11/01/19 0728 11/01/19 1112  GLUCAP 128* 207* 227* 191* 142*    Microbiology: Results for orders placed or performed during the hospital encounter of 10/28/19  Respiratory Panel by RT PCR (Flu A&B, Covid) - Nasopharyngeal Swab     Status: Abnormal   Collection Time: 10/28/19  9:38 PM   Specimen: Nasopharyngeal Swab  Result Value Ref Range Status   SARS Coronavirus 2 by RT PCR POSITIVE (A) NEGATIVE Final    Comment: RESULT CALLED TO, READ BACK BY AND VERIFIED WITH: Charolotte Capuchin RN [id number] 10/28/19 HNM (NOTE) SARS-CoV-2 target nucleic acids are DETECTED. SARS-CoV-2 RNA is generally detectable in upper respiratory specimens  during the acute phase of infection. Positive results are indicative of the presence of the identified virus, but do not rule out bacterial infection or co-infection with other pathogens not detected by the test. Clinical correlation with patient history and other diagnostic information is necessary to determine patient infection status. The expected result is Negative. Fact Sheet for Patients:  PinkCheek.be Fact Sheet for Healthcare Providers: GravelBags.it This test is not yet approved or cleared by the [place] FDA and  has been authorized for detection and/or diagnosis of  SARS-CoV-2 by FDA under an Emergency Use Authorization (EUA).  This EUA will remain in effect (meaning this test can be used) f or the duration of  the COVID-19 declaration under Section 564(b)(1) of the Act, 21 U.S.C. section 360bbb-3(b)(1), unless the authorization is terminated or revoked sooner.    Influenza A by PCR NEGATIVE NEGATIVE Final   Influenza B by PCR NEGATIVE NEGATIVE Final    Comment:  (NOTE) The Xpert Xpress SARS-CoV-2/FLU/RSV assay is intended as an aid in  the diagnosis of influenza from Nasopharyngeal swab specimens and  should not be used as a sole basis for treatment. Nasal washings and  aspirates are unacceptable for Xpert Xpress SARS-CoV-2/FLU/RSV  testing. Fact Sheet for Patients: PinkCheek.be Fact Sheet for Healthcare Providers: GravelBags.it This test is not yet approved or cleared by the [place] FDA and  has been authorized for detection and/or diagnosis of SARS-CoV-2 by  FDA under an Emergency Use Authorization (EUA). This EUA will remain  in effect (meaning this test can be used) for the duration of the  Covid-19 declaration under Section 564(b)(1) of the Act, 21  U.S.C. section 360bbb-3(b)(1), unless the authorization is  terminated or revoked. Performed at [facility], [address] [place], [place] [address]     Coagulation Studies: No results for input(s): LABPROT, INR in the last 72 hours.  Urinalysis: No results for input(s): COLORURINE, LABSPEC, PHURINE, GLUCOSEU, HGBUR, BILIRUBINUR, KETONESUR, PROTEINUR, UROBILINOGEN, NITRITE, LEUKOCYTESUR in the last 72 hours.  Invalid input(s): APPERANCEUR    Imaging: No results found.   Medications:   . sodium chloride 50 mL/hr at 10/30/19 1050  . dialysis solution 1.5% low-MG/low-CA    . remdesivir 100 mg in NS 100 mL 100 mg (11/01/19 0817)   . aspirin  81 mg Oral Daily  . atorvastatin  40 mg Oral q1800  . calcitRIOL  0.25 mcg Oral Daily  . calcium acetate  1,334 mg Oral TID WC  . carvedilol  25 mg Oral BID  . cloNIDine  0.2 mg Oral BID  . clopidogrel  75 mg Oral Daily  . dexamethasone  6 mg Oral Daily  . doxazosin  2 mg Oral QHS  . fluticasone  2 spray Each Nare Daily  . furosemide  80 mg Oral BID  . gentamicin cream  1 application Topical Daily  . insulin aspart  0-9 Units Subcutaneous TID PC & HS  . insulin  glargine  20 Units Subcutaneous Daily  . irbesartan  75 mg Oral Daily  . Vitamin D (Ergocalciferol)  50,000 Units Oral Q7 days   acetaminophen **OR** acetaminophen, labetalol, magnesium hydroxide, ondansetron **OR** ondansetron (ZOFRAN) IV, traZODone  Assessment/ Plan:  64 y.o. female with past medical history of ESRD on PD, diabetes mellitus type 2, hyperlipidemia, hypertension, peripheral vascular disease, anemia of chronic kidney disease, secondary hyperparathyroidism who was admitted with syncope and COVID-19 infection.  1.  ESRD on peritoneal dialysis.  Patient will resume CCPD at home tonight.  Tolerated peritoneal dialysis well while admitted.  2.  Hypertension.  As before patient has significant orthostasis likely owing to autonomic neuropathy from her underlying diabetes mellitus type 2.  However treatment will pose significant challenges.  Would not recommend to do cortisone given ESRD and potential for salt retention.  In addition would not recommend midodrine at this time as this will lead to severe supine hypertension.  Recommend TED hose for now.  Also recommend checking blood pressure manually.  3.  Anemia of chronic kidney disease.  Hemoglobin 10.6.  Continue to monitor CBC as an outpatient.  4.  COVID-19 infection.  Patient treated with remdesivir as well as dexamethasone.  Overall has done well from this perspective.  It appears that she will be receiving outpatient dexamethasone as well.   LOS: 3 Valin Massie 2/19/20213:41 PM

## 2019-11-01 NOTE — Discharge Instructions (Addendum)
You are scheduled for an outpatient infusion of Remdesivir at 11:30 AM on Saturday 2/20.  Please report to [facility] at [address].  Drive to the security guard and tell them you are here for an infusion. They will direct you to the front entrance where we will come and get you.  For questions call [phone].  Thanks    COVID-19 COVID-19 is a respiratory infection that is caused by a virus called severe acute respiratory syndrome coronavirus 2 (SARS-CoV-2). The disease is also known as coronavirus disease or novel coronavirus. In some people, the virus may not cause any symptoms. In others, it may cause a serious infection. The infection can get worse quickly and can lead to complications, such as:  Pneumonia, or infection of the lungs.  Acute respiratory distress syndrome or ARDS. This is a condition in which fluid build-up in the lungs prevents the lungs from filling with air and passing oxygen into the blood.  Acute respiratory failure. This is a condition in which there is not enough oxygen passing from the lungs to the body or when carbon dioxide is not passing from the lungs out of the body.  Sepsis or septic shock. This is a serious bodily reaction to an infection.  Blood clotting problems.  Secondary infections due to bacteria or fungus.  Organ failure. This is when your body's organs stop working. The virus that causes COVID-19 is contagious. This means that it can spread from person to person through droplets from coughs and sneezes (respiratory secretions). What are the causes? This illness is caused by a virus. You may catch the virus by:  Breathing in droplets from an infected person. Droplets can be spread by a person breathing, speaking, singing, coughing, or sneezing. Touching something, like a table or a doorknob, that was exposed to the virus (contaminated) and then touching your mouth, nose, or eyes. What increases the risk? Risk for infection You  are more likely to be infected with this virus if you:  Are within 6 feet (2 meters) of a person with COVID-19.  Provide care for or live with a person who is infected with COVID-19.  Spend time in crowded indoor spaces or live in shared housing. Risk for serious illness You are more likely to become seriously ill from the virus if you:  Are 62 years of age or older. The higher your age, the more you are at risk for serious illness.  Live in a nursing home or long-term care facility.  Have cancer.  Have a long-term (chronic) disease such as: ? Chronic lung disease, including chronic obstructive pulmonary disease or asthma. ? A long-term disease that lowers your body's ability to fight infection (immunocompromised). ? Heart disease, including heart failure, a condition in which the arteries that lead to the heart become narrow or blocked (coronary artery disease), a disease which makes the heart muscle thick, weak, or stiff (cardiomyopathy). ? Diabetes. ? Chronic kidney disease. ? Sickle cell disease, a condition in which red blood cells have an abnormal "sickle" shape. ? Liver disease.  Are obese. What are the signs or symptoms? Symptoms of this condition can range from mild to severe. Symptoms may appear any time from 2 to 14 days after being exposed to the virus. They include:  A fever or chills.  A cough.  Difficulty breathing.  Headaches, body aches, or muscle aches.  Runny or stuffy (congested) nose.  A sore throat.  New loss of taste or smell.  Some people may also have stomach problems, such as nausea, vomiting, or diarrhea. Other people may not have any symptoms of COVID-19. How is this diagnosed? This condition may be diagnosed based on:  Your signs and symptoms, especially if: ? You live in an area with a COVID-19 outbreak. ? You recently traveled to or from an area where the virus is common. ? You provide care for or live with a person who was diagnosed  with COVID-19. ? You were exposed to a person who was diagnosed with COVID-19.  A physical exam.  Lab tests, which may include: ? Taking a sample of fluid from the back of your nose and throat (nasopharyngeal fluid), your nose, or your throat using a swab. ? A sample of mucus from your lungs (sputum). ? Blood tests.  Imaging tests, which may include, X-rays, CT scan, or ultrasound. How is this treated? At present, there is no medicine to treat COVID-19. Medicines that treat other diseases are being used on a trial basis to see if they are effective against COVID-19. Your health care provider will talk with you about ways to treat your symptoms. For most people, the infection is mild and can be managed at home with rest, fluids, and over-the-counter medicines. Treatment for a serious infection usually takes places in a hospital intensive care unit (ICU). It may include one or more of the following treatments. These treatments are given until your symptoms improve.  Receiving fluids and medicines through an IV.  Supplemental oxygen. Extra oxygen is given through a tube in the nose, a face mask, or a hood.  Positioning you to lie on your stomach (prone position). This makes it easier for oxygen to get into the lungs.  Continuous positive airway pressure (CPAP) or bi-level positive airway pressure (BPAP) machine. This treatment uses mild air pressure to keep the airways open. A tube that is connected to a motor delivers oxygen to the body.  Ventilator. This treatment moves air into and out of the lungs by using a tube that is placed in your windpipe.  Tracheostomy. This is a procedure to create a hole in the neck so that a breathing tube can be inserted.  Extracorporeal membrane oxygenation (ECMO). This procedure gives the lungs a chance to recover by taking over the functions of the heart and lungs. It supplies oxygen to the body and removes carbon dioxide. Follow these instructions at  home: Lifestyle  If you are sick, stay home except to get medical care. Your health care provider will tell you how long to stay home. Call your health care provider before you go for medical care.  Rest at home as told by your health care provider.  Do not use any products that contain nicotine or tobacco, such as cigarettes, e-cigarettes, and chewing tobacco. If you need help quitting, ask your health care provider.  Return to your normal activities as told by your health care provider. Ask your health care provider what activities are safe for you. General instructions  Take over-the-counter and prescription medicines only as told by your health care provider.  Drink enough fluid to keep your urine pale yellow.  Keep all follow-up visits as told by your health care provider. This is important. How is this prevented?  There is no vaccine to help prevent COVID-19 infection. However, there are steps you can take to protect yourself and others from this virus. To protect yourself:   Do not travel to areas where COVID-19 is a risk.  The areas where COVID-19 is reported change often. To identify high-risk areas and travel restrictions, check the CDC travel website: FatFares.com.br  If you live in, or must travel to, an area where COVID-19 is a risk, take precautions to avoid infection. ? Stay away from people who are sick. ? Wash your hands often with soap and water for 20 seconds. If soap and water are not available, use an alcohol-based hand sanitizer. ? Avoid touching your mouth, face, eyes, or nose. ? Avoid going out in public, follow guidance from your state and local health authorities. ? If you must go out in public, wear a cloth face covering or face mask. Make sure your mask covers your nose and mouth. ? Avoid crowded indoor spaces. Stay at least 6 feet (2 meters) away from others. ? Disinfect objects and surfaces that are frequently touched every day. This may  include:  Counters and tables.  Doorknobs and light switches.  Sinks and faucets.  Electronics, such as phones, remote controls, keyboards, computers, and tablets. To protect others: If you have symptoms of COVID-19, take steps to prevent the virus from spreading to others.  If you think you have a COVID-19 infection, contact your health care provider right away. Tell your health care team that you think you may have a COVID-19 infection.  Stay home. Leave your house only to seek medical care. Do not use public transport.  Do not travel while you are sick.  Wash your hands often with soap and water for 20 seconds. If soap and water are not available, use alcohol-based hand sanitizer.  Stay away from other members of your household. Let healthy household members care for children and pets, if possible. If you have to care for children or pets, wash your hands often and wear a mask. If possible, stay in your own room, separate from others. Use a different bathroom.  Make sure that all people in your household wash their hands well and often.  Cough or sneeze into a tissue or your sleeve or elbow. Do not cough or sneeze into your hand or into the air.  Wear a cloth face covering or face mask. Make sure your mask covers your nose and mouth. Where to find more information  Centers for Disease Control and Prevention: PurpleGadgets.be  World Health Organization: https://www.castaneda.info/ Contact a health care provider if:  You live in or have traveled to an area where COVID-19 is a risk and you have symptoms of the infection.  You have had contact with someone who has COVID-19 and you have symptoms of the infection. Get help right away if:  You have trouble breathing.  You have pain or pressure in your chest.  You have confusion.  You have bluish lips and fingernails.  You have difficulty waking from sleep.  You have symptoms that get  worse. These symptoms may represent a serious problem that is an emergency. Do not wait to see if the symptoms will go away. Get medical help right away. Call your local emergency services (911 in the [place] Do not drive yourself to the hospital. Let the emergency medical personnel know if you think you have COVID-19. Summary  COVID-19 is a respiratory infection that is caused by a virus. It is also known as coronavirus disease or novel coronavirus. It can cause serious infections, such as pneumonia, acute respiratory distress syndrome, acute respiratory failure, or sepsis.  The virus that causes COVID-19 is contagious. This means that it can spread from person  to person through droplets from breathing, speaking, singing, coughing, or sneezing.  You are more likely to develop a serious illness if you are 48 years of age or older, have a weak immune system, live in a nursing home, or have chronic disease.  There is no medicine to treat COVID-19. Your health care provider will talk with you about ways to treat your symptoms.  Take steps to protect yourself and others from infection. Wash your hands often and disinfect objects and surfaces that are frequently touched every day. Stay away from people who are sick and wear a mask if you are sick. This information is not intended to replace advice given to you by your health care provider. Make sure you discuss any questions you have with your health care provider. Document Revised: 06/28/2019 Document Reviewed: 10/04/2018 Elsevier Patient Education  2020 [place].  COVID-19: How to Protect Yourself and Others Know how it spreads  There is currently no vaccine to prevent coronavirus disease 2019 (COVID-19).  The best way to prevent illness is to avoid being exposed to this virus.  The virus is thought to spread mainly from person-to-person. ? Between people who are in close contact with one another (within about 6 feet). ? Through  respiratory droplets produced when an infected person coughs, sneezes or talks. ? These droplets can land in the mouths or noses of people who are nearby or possibly be inhaled into the lungs. ? COVID-19 may be spread by people who are not showing symptoms. Everyone should Clean your hands often  Wash your hands often with soap and water for at least 20 seconds especially after you have been in a public place, or after blowing your nose, coughing, or sneezing.  If soap and water are not readily available, use a hand sanitizer that contains at least 60% alcohol. Cover all surfaces of your hands and rub them together until they feel dry.  Avoid touching your eyes, nose, and mouth with unwashed hands. Avoid close contact  Limit contact with others as much as possible.  Avoid close contact with people who are sick.  Put distance between yourself and other people. ? Remember that some people without symptoms may be able to spread virus. ? This is especially important for people who are at higher risk of getting very GainPain.com.cy Cover your mouth and nose with a mask when around others  You could spread COVID-19 to others even if you do not feel sick.  Everyone should wear a mask in public settings and when around people not living in their household, especially when social distancing is difficult to maintain. ? Masks should not be placed on young children under age 11, anyone who has trouble breathing, or is unconscious, incapacitated or otherwise unable to remove the mask without assistance.  The mask is meant to protect other people in case you are infected.  Do NOT use a facemask meant for a Dietitian.  Continue to keep about 6 feet between yourself and others. The mask is not a substitute for social distancing. Cover coughs and sneezes  Always cover your mouth and nose with a tissue when you cough or  sneeze or use the inside of your elbow.  Throw used tissues in the trash.  Immediately wash your hands with soap and water for at least 20 seconds. If soap and water are not readily available, clean your hands with a hand sanitizer that contains at least 60% alcohol. Clean and disinfect  Clean AND  disinfect frequently touched surfaces daily. This includes tables, doorknobs, light switches, countertops, handles, desks, phones, keyboards, toilets, faucets, and sinks. RackRewards.fr  If surfaces are dirty, clean them: Use detergent or soap and water prior to disinfection.  Then, use a household disinfectant. You can see a list of EPA-registered household disinfectants here. [email or web address] 05/15/2019 This information is not intended to replace advice given to you by your health care provider. Make sure you discuss any questions you have with your health care provider. Document Revised: 05/23/2019 Document Reviewed: 03/21/2019 Elsevier Patient Education  [place] Can Do to Manage Your COVID-19 Symptoms at Home If you have possible or confirmed COVID-19: 1. Stay home from work and school. And stay away from other public places. If you must go out, avoid using any kind of public transportation, ridesharing, or taxis. 2. Monitor your symptoms carefully. If your symptoms get worse, call your healthcare provider immediately. 3. Get rest and stay hydrated. 4. If you have a medical appointment, call the healthcare provider ahead of time and tell them that you have or may have COVID-19. 5. For medical emergencies, call 911 and notify the dispatch personnel that you have or may have COVID-19. 6. Cover your cough and sneezes with a tissue or use the inside of your elbow. 7. Wash your hands often with soap and water for at least 20 seconds or clean your hands with an alcohol-based hand sanitizer that contains  at least 60% alcohol. 8. As much as possible, stay in a specific room and away from other people in your home. Also, you should use a separate bathroom, if available. If you need to be around other people in or outside of the home, wear a mask. 9. Avoid sharing personal items with other people in your household, like dishes, towels, and bedding. 10. Clean all surfaces that are touched often, like counters, tabletops, and doorknobs. Use household cleaning sprays or wipes according to the label instructions. [email or web address] 03/13/2019 This information is not intended to replace advice given to you by your health care provider. Make sure you discuss any questions you have with your health care provider. Document Revised: 08/15/2019 Document Reviewed: 08/15/2019 Elsevier Patient Education  [place].   COVID-19 Frequently Asked Questions COVID-19 (coronavirus disease) is an infection that is caused by a large family of viruses. Some viruses cause illness in people and others cause illness in animals like camels, cats, and bats. In some cases, the viruses that cause illness in animals can spread to humans. Where did the coronavirus come from? In December 2019, [place] told the [facility]) of several cases of lung disease (human respiratory illness). These cases were linked to an open seafood and livestock market in the city of [place]. The link to the seafood and livestock market suggests that the virus may have spread from animals to humans. However, since that first outbreak in December, the virus has also been shown to spread from person to person. What is the name of the disease and the virus? Disease name Early on, this disease was called novel coronavirus. This is because scientists determined that the disease was caused by a new (novel) respiratory virus. The World Health Organization [facility]) has now named the disease COVID-19, or coronavirus disease. Virus name The  virus that causes the disease is called severe acute respiratory syndrome coronavirus 2 (SARS-CoV-2). More information on disease and virus naming World Health Organization [facility]): www.who.int/emergencies/diseases/novel-coronavirus-2019/technical-guidance/naming-the-coronavirus-disease-(covid-2019)-and-the-virus-that-causes-it Who is at risk for complications  from coronavirus disease? Some people may be at higher risk for complications from coronavirus disease. This includes older adults and people who have chronic diseases, such as heart disease, diabetes, and lung disease. If you are at higher risk for complications, take these extra precautions:  Stay home as much as possible.  Avoid social gatherings and travel.  Avoid close contact with others. Stay at least 6 ft (2 m) away from others, if possible.  Wash your hands often with soap and water for at least 20 seconds.  Avoid touching your face, mouth, nose, or eyes.  Keep supplies on hand at home, such as food, medicine, and cleaning supplies.  If you must go out in public, wear a cloth face covering or face mask. Make sure your mask covers your nose and mouth. How does coronavirus disease spread? The virus that causes coronavirus disease spreads easily from person to person (is contagious). You may catch the virus by:  Breathing in droplets from an infected person. Droplets can be spread by a person breathing, speaking, singing, coughing, or sneezing.  Touching something, like a table or a doorknob, that was exposed to the virus (contaminated) and then touching your mouth, nose, or eyes. Can I get the virus from touching surfaces or objects? There is still a lot that we do not know about the virus that causes coronavirus disease. Scientists are basing a lot of information on what they know about similar viruses, such as:  Viruses cannot generally survive on surfaces for long. They need a human body (host) to survive.  It is more  likely that the virus is spread by close contact with people who are sick (direct contact), such as through: ? Shaking hands or hugging. ? Breathing in respiratory droplets that travel through the air. Droplets can be spread by a person breathing, speaking, singing, coughing, or sneezing.  It is less likely that the virus is spread when a person touches a surface or object that has the virus on it (indirect contact). The virus may be able to enter the body if the person touches a surface or object and then touches his or her face, eyes, nose, or mouth. Can a person spread the virus without having symptoms of the disease? It may be possible for the virus to spread before a person has symptoms of the disease, but this is most likely not the main way the virus is spreading. It is more likely for the virus to spread by being in close contact with people who are sick and breathing in the respiratory droplets spread by a person breathing, speaking, singing, coughing, or sneezing. What are the symptoms of coronavirus disease? Symptoms vary from person to person and can range from mild to severe. Symptoms may include:  Fever or chills.  Cough.  Difficulty breathing or feeling short of breath.  Headaches, body aches, or muscle aches.  Runny or stuffy (congested) nose.  Sore throat.  New loss of taste or smell.  Nausea, vomiting, or diarrhea. These symptoms can appear anywhere from 2 to 14 days after you have been exposed to the virus. Some people may not have any symptoms. If you develop symptoms, call your health care provider. People with severe symptoms may need hospital care. Should I be tested for this virus? Your health care provider will decide whether to test you based on your symptoms, history of exposure, and your risk factors. How does a health care provider test for this virus? Health care providers will  collect samples to send for testing. Samples may include:  Taking a swab of  fluid from the back of your nose and throat, your nose, or your throat.  Taking fluid from the lungs by having you cough up mucus (sputum) into a sterile cup.  Taking a blood sample. Is there a treatment or vaccine for this virus? Currently, there is no vaccine to prevent coronavirus disease. Also, there are no medicines like antibiotics or antivirals to treat the virus. A person who becomes sick is given supportive care, which means rest and fluids. A person may also relieve his or her symptoms by using over-the-counter medicines that treat sneezing, coughing, and runny nose. These are the same medicines that a person takes for the common cold. If you develop symptoms, call your health care provider. People with severe symptoms may need hospital care. What can I do to protect myself and my family from this virus?     You can protect yourself and your family by taking the same actions that you would take to prevent the spread of other viruses. Take the following actions:  Wash your hands often with soap and water for at least 20 seconds. If soap and water are not available, use alcohol-based hand sanitizer.  Avoid touching your face, mouth, nose, or eyes.  Cough or sneeze into a tissue, sleeve, or elbow. Do not cough or sneeze into your hand or the air. ? If you cough or sneeze into a tissue, throw it away immediately and wash your hands.  Disinfect objects and surfaces that you frequently touch every day.  Stay away from people who are sick.  Avoid going out in public, follow guidance from your state and local health authorities.  Avoid crowded indoor spaces. Stay at least 6 ft (2 m) away from others.  If you must go out in public, wear a cloth face covering or face mask. Make sure your mask covers your nose and mouth.  Stay home if you are sick, except to get medical care. Call your health care provider before you get medical care. Your health care provider will tell you how long to  stay home.  Make sure your vaccines are up to date. Ask your health care provider what vaccines you need. What should I do if I need to travel? Follow travel recommendations from your local health authority, the CDC, and WHO. Travel information and advice  Centers for Disease Control and Prevention (CDC): BodyEditor.hu  World Health Organization [facility]): ThirdIncome.ca Know the risks and take action to protect your health  You are at higher risk of getting coronavirus disease if you are traveling to areas with an outbreak or if you are exposed to travelers from areas with an outbreak.  Wash your hands often and practice good hygiene to lower the risk of catching or spreading the virus. What should I do if I am sick? General instructions to stop the spread of infection  Wash your hands often with soap and water for at least 20 seconds. If soap and water are not available, use alcohol-based hand sanitizer.  Cough or sneeze into a tissue, sleeve, or elbow. Do not cough or sneeze into your hand or the air.  If you cough or sneeze into a tissue, throw it away immediately and wash your hands.  Stay home unless you must get medical care. Call your health care provider or local health authority before you get medical care.  Avoid public areas. Do not take public transportation,  if possible.  If you can, wear a mask if you must go out of the house or if you are in close contact with someone who is not sick. Make sure your mask covers your nose and mouth. Keep your home clean  Disinfect objects and surfaces that are frequently touched every day. This may include: ? Counters and tables. ? Doorknobs and light switches. ? Sinks and faucets. ? Electronics such as phones, remote controls, keyboards, computers, and tablets.  Wash dishes in hot, soapy water or use a dishwasher. Air-dry your  dishes.  Wash laundry in hot water. Prevent infecting other household members  Let healthy household members care for children and pets, if possible. If you have to care for children or pets, wash your hands often and wear a mask.  Sleep in a different bedroom or bed, if possible.  Do not share personal items, such as razors, toothbrushes, deodorant, combs, brushes, towels, and washcloths. Where to find more information Centers for Disease Control and Prevention (CDC)  Information and news updates: https://www.butler-gonzalez.com/ World Health Organization [facility])  Information and news updates: MissExecutive.com.ee  Coronavirus health topic: https://www.castaneda.info/  Questions and answers on COVID-19: OpportunityDebt.at  Global tracker: who.sprinklr.com American Academy of Pediatrics (AAP)  Information for families: www.healthychildren.org/English/health-issues/conditions/chest-lungs/Pages/2019-Novel-Coronavirus.aspx The coronavirus situation is changing rapidly. Check your local health authority website or the CDC and [facility] websites for updates and news. When should I contact a health care provider?  Contact your health care provider if you have symptoms of an infection, such as fever or cough, and you: ? Have been near anyone who is known to have coronavirus disease. ? Have come into contact with a person who is suspected to have coronavirus disease. ? Have traveled to an area where there is an outbreak of COVID-19. When should I get emergency medical care?  Get help right away by calling your local emergency services (911 in the U.S.) if you have: ? Trouble breathing. ? Pain or pressure in your chest. ? Confusion. ? Blue-tinged lips and fingernails. ? Difficulty waking from sleep. ? Symptoms that get worse. Let the emergency medical personnel know if you think you have coronavirus  disease. Summary  A new respiratory virus is spreading from person to person and causing COVID-19 (coronavirus disease).  The virus that causes COVID-19 appears to spread easily. It spreads from one person to another through droplets from breathing, speaking, singing, coughing, or sneezing.  Older adults and those with chronic diseases are at higher risk of disease. If you are at higher risk for complications, take extra precautions.  There is currently no vaccine to prevent coronavirus disease. There are no medicines, such as antibiotics or antivirals, to treat the virus.  You can protect yourself and your family by washing your hands often, avoiding touching your face, and covering your coughs and sneezes. This information is not intended to replace advice given to you by your health care provider. Make sure you discuss any questions you have with your health care provider. Document Revised: 06/28/2019 Document Reviewed: 12/25/2018 Elsevier Patient Education  [place]: Quarantine vs. Isolation QUARANTINE keeps someone who was in close contact with someone who has COVID-19 away from others. If you had close contact with a person who has COVID-19  Stay home until 14 days after your last contact.  Check your temperature twice a day and watch for symptoms of COVID-19.  If possible, stay away from people who are at higher-risk for getting very sick from COVID-19.  ISOLATION keeps someone who is sick or tested positive for COVID-19 without symptoms away from others, even in their own home. If you are sick and think or know you have COVID-19  Stay home until after ? At least 10 days since symptoms first appeared and ? At least 24 hours with no fever without fever-reducing medication and ? Symptoms have improved If you tested positive for COVID-19 but do not have symptoms  Stay home until after ? 10 days have passed since your positive test If you live with others,  stay in a specific "sick room" or area and away from other people or animals, including pets. Use a separate bathroom, if available. [email or web address].com 04/01/2019 This information is not intended to replace advice given to you by your health care provider. Make sure you discuss any questions you have with your health care provider. Document Revised: 08/15/2019 Document Reviewed: 08/15/2019 Elsevier Patient Education  [place].   Orthostatic Hypotension Blood pressure is a measurement of how strongly, or weakly, your blood is pressing against the walls of your arteries. Orthostatic hypotension is a sudden drop in blood pressure that happens when you quickly change positions, such as when you get up from sitting or lying down. Arteries are blood vessels that carry blood from your heart throughout your body. When blood pressure is too low, you may not get enough blood to your brain or to the rest of your organs. This can cause weakness, light-headedness, rapid heartbeat, and fainting. This can last for just a few seconds or for up to a few minutes. Orthostatic hypotension is usually not a serious problem. However, if it happens frequently or gets worse, it may be a sign of something more serious. What are the causes? This condition may be caused by:  Sudden changes in posture, such as standing up quickly after you have been sitting or lying down.  Blood loss.  Loss of body fluids (dehydration).  Heart problems.  Hormone (endocrine) problems.  Pregnancy.  Severe infection.  Lack of certain nutrients.  Severe allergic reactions (anaphylaxis).  Certain medicines, such as blood pressure medicine or medicines that make the body lose excess fluids (diuretics). Sometimes, this condition can be caused by not taking medicine as directed, such as taking too much of a certain medicine. What increases the risk? The following factors may make you more likely to develop this  condition:  Age. Risk increases as you get older.  Conditions that affect the heart or the central nervous system.  Taking certain medicines, such as blood pressure medicine or diuretics.  Being pregnant. What are the signs or symptoms? Symptoms of this condition may include:  Weakness.  Light-headedness.  Dizziness.  Blurred vision.  Fatigue.  Rapid heartbeat.  Fainting, in severe cases. How is this diagnosed? This condition is diagnosed based on:  Your medical history.  Your symptoms.  Your blood pressure measurement. Your health care provider will check your blood pressure when you are: ? Lying down. ? Sitting. ? Standing. A blood pressure reading is recorded as two numbers, such as "120 over 80" (or 120/80). The first ("top") number is called the systolic pressure. It is a measure of the pressure in your arteries as your heart beats. The second ("bottom") number is called the diastolic pressure. It is a measure of the pressure in your arteries when your heart relaxes between beats. Blood pressure is measured in a unit called mm Hg. Healthy blood pressure for most adults is 120/80. If your  blood pressure is below 90/60, you may be diagnosed with hypotension. Other information or tests that may be used to diagnose orthostatic hypotension include:  Your other vital signs, such as your heart rate and temperature.  Blood tests.  Tilt table test. For this test, you will be safely secured to a table that moves you from a lying position to an upright position. Your heart rhythm and blood pressure will be monitored during the test. How is this treated? This condition may be treated by:  Changing your diet. This may involve eating more salt (sodium) or drinking more water.  Taking medicines to raise your blood pressure.  Changing the dosage of certain medicines you are taking that might be lowering your blood pressure.  Wearing compression stockings. These stockings  help to prevent blood clots and reduce swelling in your legs. In some cases, you may need to go to the hospital for:  Fluid replacement. This means you will receive fluids through an IV.  Blood replacement. This means you will receive donated blood through an IV (transfusion).  Treating an infection or heart problems, if this applies.  Monitoring. You may need to be monitored while medicines that you are taking wear off. Follow these instructions at home: Eating and drinking   Drink enough fluid to keep your urine pale yellow.  Eat a healthy diet, and follow instructions from your health care provider about eating or drinking restrictions. A healthy diet includes: ? Fresh fruits and vegetables. ? Whole grains. ? Lean meats. ? Low-fat dairy products.  Eat extra salt only as directed. Do not add extra salt to your diet unless your health care provider told you to do that.  Eat frequent, small meals.  Avoid standing up suddenly after eating. Medicines  Take over-the-counter and prescription medicines only as told by your health care provider. ? Follow instructions from your health care provider about changing the dosage of your current medicines, if this applies. ? Do not stop or adjust any of your medicines on your own. General instructions   Wear compression stockings as told by your health care provider.  Get up slowly from lying down or sitting positions. This gives your blood pressure a chance to adjust.  Avoid hot showers and excessive heat as directed by your health care provider.  Return to your normal activities as told by your health care provider. Ask your health care provider what activities are safe for you.  Do not use any products that contain nicotine or tobacco, such as cigarettes, e-cigarettes, and chewing tobacco. If you need help quitting, ask your health care provider.  Keep all follow-up visits as told by your health care provider. This is  important. Contact a health care provider if you:  Vomit.  Have diarrhea.  Have a fever for more than 2-3 days.  Feel more thirsty than usual.  Feel weak and tired. Get help right away if you:  Have chest pain.  Have a fast or irregular heartbeat.  Develop numbness in any part of your body.  Cannot move your arms or your legs.  Have trouble speaking.  Become sweaty or feel light-headed.  Faint.  Feel short of breath.  Have trouble staying awake.  Feel confused. Summary  Orthostatic hypotension is a sudden drop in blood pressure that happens when you quickly change positions.  Orthostatic hypotension is usually not a serious problem.  It is diagnosed by having your blood pressure taken lying down, sitting, and then standing.  It  may be treated by changing your diet or adjusting your medicines. This information is not intended to replace advice given to you by your health care provider. Make sure you discuss any questions you have with your health care provider. Document Revised: 02/22/2018 Document Reviewed: 02/22/2018 Elsevier Patient Education  [employer].  .

## 2019-11-01 NOTE — Progress Notes (Signed)
Pt tolerated PD tx well. UF is negative 306 with dwell time of 1:26.

## 2019-11-01 NOTE — TOC Initial Note (Signed)
Transition of Care [facility]) - Initial/Assessment Note    Patient Details  Name: Tracy Jennings MRN: [id number] Date of Birth: 04-19-1956  Transition of Care [facility]) CM/SW Contact:    Shelbie Ammons, RN Phone Number: 11/01/2019, 11:56 AM  Clinical Narrative:   RNCM assessed patient by telephone due to +Covid diagnosis. Patient was admitted to hospital initially due to fall at home and AMS along with +Covid diagnosis. Introduced self to patient and role of CM, she was agreeable to answer some questions. Patient reports that she lives in home with her husband and is normally independent. She still drives and does her own peritoneal dialysis. Her MD is Dr. Malvin Johns in [place] and she uses [facility] in [facility], denies any problems obtaining her medicines. Discussed with patient that the therapist is recommending she have therapy at home and discussed if this is something she has ever had or would be interested in. She reports she has never had home health but she would be open to it. She reports no preferences as to the agency just someone that will accept her insurance.  RNCM contacted Jason with [facility], they will be able to see patient.  RNCM called patient back to inform her of same as well as that they will contact her Sunday evening or Monday morning to see her that day. Patient verbalizes understanding.      Expected Discharge Plan: [place] Barriers to Discharge: Barriers Resolved   Patient Goals and CMS Choice     Choice offered to / list presented to : Patient  Expected Discharge Plan and Services Expected Discharge Plan: [place]   Discharge Planning Services: CM Consult Post Acute Care Choice: [place] arrangements for the past 2 months: Single Family Home Expected Discharge Date: 11/01/19                         HH Arranged: PT, OT, Nurse's Aide HH Agency: [place] ([place]) Date HH Agency Contacted: 11/01/19 Time [place]: 29 Representative spoke with at [place]: Corene Cornea  Prior Living Arrangements/Services Living arrangements for the past 2 months: [place] Lives with:: Spouse Patient language and need for interpreter reviewed:: Yes        Need for Family Participation in Patient Care: Yes (Comment) Care giver support system in place?: Yes (comment)   Criminal Activity/Legal Involvement Pertinent to Current Situation/Hospitalization: No - Comment as needed  Activities of Daily Living Home Assistive Devices/Equipment: None ADL Screening (condition at time of admission) Patient's cognitive ability adequate to safely complete daily activities?: Yes Is the patient deaf or have difficulty hearing?: No Does the patient have difficulty seeing, even when wearing glasses/contacts?: No Does the patient have difficulty concentrating, remembering, or making decisions?: No Patient able to express need for assistance with ADLs?: Yes Does the patient have difficulty dressing or bathing?: No Independently performs ADLs?: Yes (appropriate for developmental age) Does the patient have difficulty walking or climbing stairs?: No Weakness of Legs: None Weakness of Arms/Hands: None  Permission Sought/Granted                  Emotional Assessment Appearance:: (Assessed by phone due to +Covid diagnosis) Attitude/Demeanor/Rapport: Other (comment)(sleepy)   Orientation: : Oriented to Self, Oriented to Place, Oriented to  Time, Oriented to Situation Alcohol / Substance Use: Not Applicable Psych Involvement: No (comment)  Admission diagnosis:  Metabolic acidosis, increased anion gap (IAG) [  E87.2] Syncope [R55] ESRD (end stage renal disease) ([place]) [N18.6] Secondary hypertension [I15.9] Syncope, unspecified syncope type [R55] COVID-19 [U07.1] Patient Active Problem List   Diagnosis Date Noted  . Metabolic acidosis, increased anion gap (IAG)   . COVID-19   . Weakness   . Syncope 10/28/2019   . 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 Complication of vascular access for dialysis 05/11/2018  . Respiratory arrest ([place]) 05/11/2018  . Hyperlipidemia 04/12/2018  . Pancreatitis, recurrent 11/01/2017  . Diabetes ([place]) 11/01/2017  . HTN (hypertension) 11/01/2017  . ESRD (end stage renal disease) ([place]) 11/01/2017  . Recurrent pancreatitis 11/01/2017   PCP:  Henrietta Hoover, MD Pharmacy:   [facility] [id number], [place] [address] Phone: [phone] Fax: [phone]     Social Determinants of Health (SDOH) Interventions    Readmission Risk Interventions No flowsheet data found.

## 2019-11-01 NOTE — Evaluation (Signed)
Occupational Therapy Evaluation Patient Details Name: Tracy Jennings MRN: [id number] DOB: [birth year] Today's Date: 11/01/2019    History of Present Illness Tracy  Jennings is a 71yoF who comes to [facility] 2/15 s/p episode of syncope adn collapse to floor. Pt (+) COVID 19. PMH: end-stage renal disease on peritoneal dialysis, hypertension, type diabetes mellitus and peripheral vascular disease.   Clinical Impression   Patient seen this AM for occupational therapy evaluation.  Patient noted to be very lethargic with complaints that she was unable to sleep last night.  BP remained high during session, noted at 156/92 (notified nurse).  Patient agreeable to sit at EOB with extra time to transition with CGA.  Tolerated for ~2 minutes before complaining of dizziness and needing to return supine.  Patient states she wants to get better but was not feeling well.  Educated patient on positioning to relieve dizziness and reduce BP.  Educated patient on goals of occupational therapy; patient verbalized understanding.  Patient would benefit from skilled OT to address activity tolerance, safety awareness, ADL training, and general endurance strengthening.  Based on today's performance, if patient is able to control BP with medications, recommending [place] OT upon discharge.        Follow Up Recommendations  Home health OT    Equipment Recommendations       Recommendations for Other Services       Precautions / Restrictions Precautions Precautions: Fall Restrictions Weight Bearing Restrictions: No      Mobility Bed Mobility Overal bed mobility: Independent                Transfers Overall transfer level: Needs assistance Equipment used: None;1 person hand held assist Transfers: Sit to/from Stand Sit to Stand: Independent         General transfer comment: Requires extra time due to dizziness    Balance                                           ADL either performed or  assessed with clinical judgement   ADL Overall ADL's : Needs assistance/impaired                                       General ADL Comments: Patient requires extra time for all tasks due to dizziness and high BP.     Vision         Perception     Praxis      Pertinent Vitals/Pain Pain Assessment: No/denies pain     Hand Dominance Right   Extremity/Trunk Assessment Upper Extremity Assessment Upper Extremity Assessment: Overall WFL for tasks assessed   Lower Extremity Assessment Lower Extremity Assessment: Defer to PT evaluation   Cervical / Trunk Assessment Cervical / Trunk Assessment: Normal   Communication Communication Communication: No difficulties   Cognition Arousal/Alertness: Awake/alert Behavior During Therapy: WFL for tasks assessed/performed Overall Cognitive Status: Within Functional Limits for tasks assessed                                 General Comments: A&Ox4   General Comments       Exercises     Shoulder Instructions      Home Living Family/patient expects to be discharged to::  Private residence Living Arrangements: Spouse/significant other Available Help at Discharge: Family Type of Home: House Home Access: Stairs to enter Technical brewer of Steps: 6 Entrance Stairs-Rails: Can reach both;Left;Right Home Layout: One level     Bathroom Shower/Tub: Tub/shower unit(hand held shower head)         Home Equipment: Environmental consultant - 2 wheels;Shower seat;Hand held shower head          Prior Functioning/Environment Level of Independence: Independent                 OT Problem List: Decreased activity tolerance;Decreased knowledge of precautions;Other (comment)(knowledge of precautions regarding vitals)      OT Treatment/Interventions: Therapeutic activities;Therapeutic exercise;Self-care/ADL training;Patient/family education;Energy conservation    OT Goals(Current goals can be found in the care  plan section) Acute Rehab OT Goals Patient Stated Goal: I want to feel normal again OT Goal Formulation: With patient Time For Goal Achievement: 11/15/19 Potential to Achieve Goals: Good  OT Frequency: Min 1X/week   Barriers to D/C:            Co-evaluation              AM-PAC OT "6 Clicks" Daily Activity     Outcome Measure Help from another person eating meals?: None(set up) Help from another person taking care of personal grooming?: None(set up) Help from another person toileting, which includes using toliet, bedpan, or urinal?: A Little Help from another person bathing (including washing, rinsing, drying)?: A Little Help from another person to put on and taking off regular upper body clothing?: None Help from another person to put on and taking off regular lower body clothing?: A Little 6 Click Score: 21   End of Session Nurse Communication: Other (comment)(Discussed vital signs pre and post session)  Activity Tolerance: Other (comment);Patient limited by lethargy(Limited by dizziness) Patient left: in bed;with call bell/phone within reach;with bed alarm set  OT Visit Diagnosis: History of falling (Z91.81)                Time: 6010-9323 OT Time Calculation (min): 21 min Charges:  OT General Charges $OT Visit: 1 Visit OT Evaluation $OT Eval Low Complexity: 1 Low OT Treatments $Therapeutic Activity: 8-22 mins  Baldomero Lamy, MS, OTR/L 11/01/19, 10:13 AM

## 2019-11-01 NOTE — Progress Notes (Signed)
Patient scheduled for outpatient Remdesivir infusion at 11:30 AM on Saturday 2/20.  Please advise them to report to [facility] at [address]  Drive to the security guard and tell them you are here for an infusion. They will direct you to the front entrance where we will come and get you.  For questions call [phone].  Thanks

## 2019-11-01 NOTE — Progress Notes (Signed)
Ch spoke with Pt over the phone. Pt sounded clear; reported that she has been here for three day, and has been taken care of pretty good. Pt requested prayer for health, and family. Ch prayed with Pt. Pt was appreciative of call, and prayer.   11/01/19 1000  Clinical Encounter Type  Visited With Patient  Visit Type Initial;Spiritual support  Referral From Other (Comment)  Consult/Referral To Other (Comment)  Spiritual Encounters  Spiritual Needs Prayer  Stress Factors  Patient Stress Factors Health changes

## 2019-11-02 ENCOUNTER — Ambulatory Visit (HOSPITAL_COMMUNITY)
Admission: RE | Admit: 2019-11-02 | Discharge: 2019-11-02 | Disposition: A | Discharge status: Home / Self Care | Payer: BC Managed Care – PPO | Source: Ambulatory Visit | Attending: Pulmonary Disease | Admitting: Pulmonary Disease

## 2019-11-02 DIAGNOSIS — U071 COVID-19: Secondary | ICD-10-CM | POA: Diagnosis not present

## 2019-11-02 DIAGNOSIS — J1282 Pneumonia due to coronavirus disease 2019: Secondary | ICD-10-CM | POA: Insufficient documentation

## 2019-11-02 MED ORDER — DIPHENHYDRAMINE HCL 50 MG/ML IJ SOLN: 50.0000 mg | Freq: Once | INTRAMUSCULAR | Status: DC | PRN

## 2019-11-02 MED ORDER — METHYLPREDNISOLONE SODIUM SUCC 125 MG IJ SOLR: 125.0000 mg | Freq: Once | INTRAMUSCULAR | Status: DC | PRN

## 2019-11-02 MED ORDER — SODIUM CHLORIDE 0.9 % IV SOLN: INTRAVENOUS | Status: DC | PRN

## 2019-11-02 MED ORDER — EPINEPHRINE 0.3 MG/0.3ML IJ SOAJ: 0.3000 mg | Freq: Once | INTRAMUSCULAR | Status: DC | PRN

## 2019-11-02 MED ORDER — FAMOTIDINE IN NACL 20-0.9 MG/50ML-% IV SOLN: 20.0000 mg | Freq: Once | INTRAVENOUS | Status: DC | PRN

## 2019-11-02 MED ORDER — ALBUTEROL SULFATE HFA 108 (90 BASE) MCG/ACT IN AERS: 2.0000 | INHALATION_SPRAY | Freq: Once | RESPIRATORY_TRACT | Status: DC | PRN

## 2019-11-02 MED ORDER — SODIUM CHLORIDE 0.9 % IV SOLN
100.0000 mg | Freq: Once | INTRAVENOUS | Status: AC
  Administered 2019-11-02: 100 mg via INTRAVENOUS
  Filled 2019-11-02: qty 20

## 2019-11-02 NOTE — Progress Notes (Signed)
Patient ID: Tracy Jennings, female   DOB: 10-Dec-1955, 64 y.o.   MRN: [id number]  Diagnosis: [id number]  Physician:dr wright  Procedure: [facility] Infusion [facility] Med: remdesivir infusion.  Complications: No immediate complications noted.  Discharge: Discharged home   [place] 11/02/2019

## 2019-11-02 NOTE — Discharge Instructions (Signed)
COVID-19 COVID-19 is a respiratory infection that is caused by a virus called severe acute respiratory syndrome coronavirus 2 (SARS-CoV-2). The disease is also known as coronavirus disease or novel coronavirus. In some people, the virus may not cause any symptoms. In others, it may cause a serious infection. The infection can get worse quickly and can lead to complications, such as:  Pneumonia, or infection of the lungs.  Acute respiratory distress syndrome or ARDS. This is a condition in which fluid build-up in the lungs prevents the lungs from filling with air and passing oxygen into the blood.  Acute respiratory failure. This is a condition in which there is not enough oxygen passing from the lungs to the body or when carbon dioxide is not passing from the lungs out of the body.  Sepsis or septic shock. This is a serious bodily reaction to an infection.  Blood clotting problems.  Secondary infections due to bacteria or fungus.  Organ failure. This is when your body's organs stop working. The virus that causes COVID-19 is contagious. This means that it can spread from person to person through droplets from coughs and sneezes (respiratory secretions). What are the causes? This illness is caused by a virus. You may catch the virus by:  Breathing in droplets from an infected person. Droplets can be spread by a person breathing, speaking, singing, coughing, or sneezing.  Touching something, like a table or a doorknob, that was exposed to the virus (contaminated) and then touching your mouth, nose, or eyes. What increases the risk? Risk for infection You are more likely to be infected with this virus if you:  Are within 6 feet (2 meters) of a person with COVID-19.  Provide care for or live with a person who is infected with COVID-19.  Spend time in crowded indoor spaces or live in shared housing. Risk for serious illness You are more likely to become seriously ill from the virus if you:   Are 50 years of age or older. The higher your age, the more you are at risk for serious illness.  Live in a nursing home or long-term care facility.  Have cancer.  Have a long-term (chronic) disease such as: ? Chronic lung disease, including chronic obstructive pulmonary disease or asthma. ? A long-term disease that lowers your body's ability to fight infection (immunocompromised). ? Heart disease, including heart failure, a condition in which the arteries that lead to the heart become narrow or blocked (coronary artery disease), a disease which makes the heart muscle thick, weak, or stiff (cardiomyopathy). ? Diabetes. ? Chronic kidney disease. ? Sickle cell disease, a condition in which red blood cells have an abnormal "sickle" shape. ? Liver disease.  Are obese. What are the signs or symptoms? Symptoms of this condition can range from mild to severe. Symptoms may appear any time from 2 to 14 days after being exposed to the virus. They include:  A fever or chills.  A cough.  Difficulty breathing.  Headaches, body aches, or muscle aches.  Runny or stuffy (congested) nose.  A sore throat.  New loss of taste or smell. Some people may also have stomach problems, such as nausea, vomiting, or diarrhea. Other people may not have any symptoms of COVID-19. How is this diagnosed? This condition may be diagnosed based on:  Your signs and symptoms, especially if: ? You live in an area with a COVID-19 outbreak. ? You recently traveled to or from an area where the virus is common. ? You   provide care for or live with a person who was diagnosed with COVID-19. ? You were exposed to a person who was diagnosed with COVID-19.  A physical exam.  Lab tests, which may include: ? Taking a sample of fluid from the back of your nose and throat (nasopharyngeal fluid), your nose, or your throat using a swab. ? A sample of mucus from your lungs (sputum). ? Blood tests.  Imaging tests, which  may include, X-rays, CT scan, or ultrasound. How is this treated? At present, there is no medicine to treat COVID-19. Medicines that treat other diseases are being used on a trial basis to see if they are effective against COVID-19. Your health care provider will talk with you about ways to treat your symptoms. For most people, the infection is mild and can be managed at home with rest, fluids, and over-the-counter medicines. Treatment for a serious infection usually takes places in a hospital intensive care unit (ICU). It may include one or more of the following treatments. These treatments are given until your symptoms improve.  Receiving fluids and medicines through an IV.  Supplemental oxygen. Extra oxygen is given through a tube in the nose, a face mask, or a hood.  Positioning you to lie on your stomach (prone position). This makes it easier for oxygen to get into the lungs.  Continuous positive airway pressure (CPAP) or bi-level positive airway pressure (BPAP) machine. This treatment uses mild air pressure to keep the airways open. A tube that is connected to a motor delivers oxygen to the body.  Ventilator. This treatment moves air into and out of the lungs by using a tube that is placed in your windpipe.  Tracheostomy. This is a procedure to create a hole in the neck so that a breathing tube can be inserted.  Extracorporeal membrane oxygenation (ECMO). This procedure gives the lungs a chance to recover by taking over the functions of the heart and lungs. It supplies oxygen to the body and removes carbon dioxide. Follow these instructions at home: Lifestyle  If you are sick, stay home except to get medical care. Your health care provider will tell you how long to stay home. Call your health care provider before you go for medical care.  Rest at home as told by your health care provider.  Do not use any products that contain nicotine or tobacco, such as cigarettes, e-cigarettes, and  chewing tobacco. If you need help quitting, ask your health care provider.  Return to your normal activities as told by your health care provider. Ask your health care provider what activities are safe for you. General instructions  Take over-the-counter and prescription medicines only as told by your health care provider.  Drink enough fluid to keep your urine pale yellow.  Keep all follow-up visits as told by your health care provider. This is important. How is this prevented?  There is no vaccine to help prevent COVID-19 infection. However, there are steps you can take to protect yourself and others from this virus. To protect yourself:   Do not travel to areas where COVID-19 is a risk. The areas where COVID-19 is reported change often. To identify high-risk areas and travel restrictions, check the CDC travel website: wwwnc.cdc.gov/travel/notices  If you live in, or must travel to, an area where COVID-19 is a risk, take precautions to avoid infection. ? Stay away from people who are sick. ? Wash your hands often with soap and water for 20 seconds. If soap and water   are not available, use an alcohol-based hand sanitizer. ? Avoid touching your mouth, face, eyes, or nose. ? Avoid going out in public, follow guidance from your state and local health authorities. ? If you must go out in public, wear a cloth face covering or face mask. Make sure your mask covers your nose and mouth. ? Avoid crowded indoor spaces. Stay at least 6 feet (2 meters) away from others. ? Disinfect objects and surfaces that are frequently touched every day. This may include:  Counters and tables.  Doorknobs and light switches.  Sinks and faucets.  Electronics, such as phones, remote controls, keyboards, computers, and tablets. To protect others: If you have symptoms of COVID-19, take steps to prevent the virus from spreading to others.  If you think you have a COVID-19 infection, contact your health care  provider right away. Tell your health care team that you think you may have a COVID-19 infection.  Stay home. Leave your house only to seek medical care. Do not use public transport.  Do not travel while you are sick.  Wash your hands often with soap and water for 20 seconds. If soap and water are not available, use alcohol-based hand sanitizer.  Stay away from other members of your household. Let healthy household members care for children and pets, if possible. If you have to care for children or pets, wash your hands often and wear a mask. If possible, stay in your own room, separate from others. Use a different bathroom.  Make sure that all people in your household wash their hands well and often.  Cough or sneeze into a tissue or your sleeve or elbow. Do not cough or sneeze into your hand or into the air.  Wear a cloth face covering or face mask. Make sure your mask covers your nose and mouth. Where to find more information  Centers for Disease Control and Prevention: www.cdc.gov/coronavirus/2019-ncov/index.html  World Health Organization: www.who.int/health-topics/coronavirus Contact a health care provider if:  You live in or have traveled to an area where COVID-19 is a risk and you have symptoms of the infection.  You have had contact with someone who has COVID-19 and you have symptoms of the infection. Get help right away if:  You have trouble breathing.  You have pain or pressure in your chest.  You have confusion.  You have bluish lips and fingernails.  You have difficulty waking from sleep.  You have symptoms that get worse. These symptoms may represent a serious problem that is an emergency. Do not wait to see if the symptoms will go away. Get medical help right away. Call your local emergency services (911 in the [place] Do not drive yourself to the hospital. Let the emergency medical personnel know if you think you have COVID-19. Summary  COVID-19 is a  respiratory infection that is caused by a virus. It is also known as coronavirus disease or novel coronavirus. It can cause serious infections, such as pneumonia, acute respiratory distress syndrome, acute respiratory failure, or sepsis.  The virus that causes COVID-19 is contagious. This means that it can spread from person to person through droplets from breathing, speaking, singing, coughing, or sneezing.  You are more likely to develop a serious illness if you are 50 years of age or older, have a weak immune system, live in a nursing home, or have chronic disease.  There is no medicine to treat COVID-19. Your health care provider will talk with you about ways to treat your symptoms.    Take steps to protect yourself and others from infection. Wash your hands often and disinfect objects and surfaces that are frequently touched every day. Stay away from people who are sick and wear a mask if you are sick. This information is not intended to replace advice given to you by your health care provider. Make sure you discuss any questions you have with your health care provider. Document Revised: 06/28/2019 Document Reviewed: 10/04/2018 Elsevier Patient Education  2020 Elsevier Inc.  

## 2019-11-02 NOTE — Discharge Summary (Addendum)
[facility] NAME: Tracy Jennings    MR#:  [id number]  DATE OF BIRTH:  07/20/1956  DATE OF ADMISSION:  10/28/2019   ADMITTING PHYSICIAN: Fritzi Mandes, MD  DATE OF DISCHARGE: 11/01/2019  3:22 PM  PRIMARY CARE PHYSICIAN: Bynum, Gershon Crane, MD   ADMISSION DIAGNOSIS:  Metabolic acidosis, increased anion gap (IAG) [E87.2] Syncope [R55] ESRD (end stage renal disease) ([place]) [N18.6] Secondary hypertension [I15.9] Syncope, unspecified syncope type [R55] COVID-19 [U07.1] DISCHARGE DIAGNOSIS:  Active Problems:   ESRD (end stage renal disease) ([facility])   Syncope   COVID-19   Weakness   Metabolic acidosis, increased anion gap (IAG)  SECONDARY DIAGNOSIS:   Past Medical History:  Diagnosis Date  . Anemia 04/2018   low iron. to be started on supplements  . CKD (chronic kidney disease)    Stage IV  . Diabetes mellitus without complication ([place])    type II  . ESRD (end stage renal disease) ([place])   . Heart murmur    followed as a child only  . Hyperlipidemia associated with type 2 diabetes mellitus ([place])   . Hypertension   . Pancreatitis   . Peripheral vascular disease [facility])    HOSPITAL COURSE:  AltheaJamisonis a63 y.o.femalewith a known history of end-stage renal disease on peritoneal dialysis, hypertension, type diabetes mellitus and peripheral vascular disease, who presented to the emergency room with acute onset of syncope. She was last seen around 7 PM on 2/15 by her husband who later went to the store and when he came back he found her on the ground. She admits to intermittent diarrhea over the last couple weeks   1. Syncope. -Likely from orthostatic hypotension (likely autonomic dysfunction from longstanding diabetes) and uncontrolled blood pressure CT head negative, CT cervical spine--multilevel DJD - carotid Doppler negative for high grade stenosis -2D echo EF of 60 to 65%. - Compression stocking advised and prescribed  at discharge. - To avoid recurrent syncope and dangerous falls, patient is cautioned to be extremely careful if they must arise at night, since orthostatic symptoms can be exacerbated. -Patient has significant orthostasis likely due to to autonomic neuropathy from her underlying diabetes mellitus type 2. However treatment will pose significant challenges.  Nephrology do not recommend cortisone given ESRD and potential for salt retention.   The also do not recommend midodrine at this time as this will lead to severe supine hypertension. -I have requested patient to have outpatient cardiology and neurology evaluation.  She may need tertiary care outpatient follow-up for her orthostasis evaluation and management  2. Hypertensive urgency. -Increase the dose of clonidine at 0.2 mg p.o. twice daily, continue  other home medications  3.End-stage renal disease on peritoneal dialysis.  4.COVID-19 Pneumonia.  Completed remdesivir 4/5 doses.  Fifth dose is set up for outpatient remdesivir clinic in [place].  All instructions have been given to patient and family by remdesivir outpatient infusion clinic staff  5.Type 2 diabetes mellitus Continue home regimen  PT and OT evaluated the patient and recommended home health which were set up by [facility] team DISCHARGE CONDITIONS:  Stable CONSULTS OBTAINED:  Treatment Team:  Anthonette Legato, MD DRUG ALLERGIES:   Allergies  Allergen Reactions  . Amlodipine Swelling    Knees down to ankles Knees down to ankles  . Hctz [Hydrochlorothiazide]     pancreatitis  . Heparin Other (See Comments)    Pt reports cardiac arrest when given heparin  .  Lmw Heparin     Reports cardiac arrest when given heparin  . Other Other (See Comments)    Reports cardiac arrest when given during surgery Reports cardiac arrest when given during surgery Reports cardiac arrest when given during surgery   . Amlodipine Besylate Swelling  . Sulfa Antibiotics Rash  . Dairy  Aid [Lactase]     Runny nose  . Hydralazine Nausea Only   DISCHARGE MEDICATIONS:   Allergies as of 11/01/2019      Reactions   Amlodipine Swelling   Knees down to ankles Knees down to ankles   Hctz [hydrochlorothiazide]    pancreatitis   Heparin Other (See Comments)   Pt reports cardiac arrest when given heparin   Lmw Heparin    Reports cardiac arrest when given heparin   Other Other (See Comments)   Reports cardiac arrest when given during surgery Reports cardiac arrest when given during surgery Reports cardiac arrest when given during surgery   Amlodipine Besylate Swelling   Sulfa Antibiotics Rash   Dairy Aid [lactase]    Runny nose   Hydralazine Nausea Only      Medication List    STOP taking these medications   HYDROcodone-acetaminophen 5-325 MG tablet Commonly known as: Norco   ondansetron 4 MG tablet Commonly known as: ZOFRAN     TAKE these medications   aspirin EC 81 MG tablet Take 81 mg by mouth daily.   atorvastatin 40 MG tablet Commonly known as: LIPITOR Take 40 mg by mouth daily at 6 PM.   bumetanide 1 MG tablet Commonly known as: BUMEX Take 2 mg by mouth 2 (two) times daily.   calcitRIOL 0.25 MCG capsule Commonly known as: ROCALTROL Take 0.25 mcg by mouth daily.   calcium acetate 667 MG capsule Commonly known as: PHOSLO Take 1,334 mg by mouth 3 (three) times daily.   carvedilol 25 MG tablet Commonly known as: COREG Take 25 mg by mouth 2 (two) times daily.   cloNIDine 0.2 MG tablet Commonly known as: CATAPRES Take 1 tablet (0.2 mg total) by mouth 2 (two) times daily. What changed:  medication strength how much to take   doxazosin 4 MG tablet Commonly known as: CARDURA Take 2 mg by mouth at bedtime.   fluticasone 50 MCG/ACT nasal spray Commonly known as: FLONASE 2 sprays by Each Nare route daily.   furosemide 80 MG tablet Commonly known as: LASIX Take 80 mg by mouth daily.   insulin aspart 100 UNIT/ML FlexPen Commonly known  as: NOVOLOG Inject 10-12 Units into the skin 3 (three) times daily with meals.   Insulin Glargine 300 UNIT/ML Sopn Inject 40 Units into the skin daily. In the morning.  (toujeo)   losartan 25 MG tablet Commonly known as: COZAAR Take 1 tablet (25 mg total) by mouth daily.   Plavix 75 MG tablet Generic drug: clopidogrel Take 75 mg by mouth daily.   prazosin 1 MG capsule Commonly known as: MINIPRESS Take 1 mg by mouth at bedtime.   Precision QID Test test strip Generic drug: glucose blood Use once daily One Touch Ultra Blue   predniSONE 10 MG (21) Tbpk tablet Commonly known as: STERAPRED UNI-PAK 21 TAB Start 60 mg p.o. daily, taper 10 mg daily until done   valsartan 160 MG tablet Commonly known as: DIOVAN Take 160 mg by mouth daily.   Vitamin D (Ergocalciferol) 1.25 MG (50000 UNIT) Caps capsule Commonly known as: DRISDOL TAKE ONE CAPSULE BY MOUTH ONE TIME PER WEEK  DISCHARGE INSTRUCTIONS:  She will need fifth dose of her remdesivir at outpatient remdesivir clinic in [place] Has been provided wheelchair/DME as recommended by physical therapy DIET:  Cardiac diet DISCHARGE CONDITION:  Stable ACTIVITY:  Activity as tolerated OXYGEN:  Home Oxygen: No.  Oxygen Delivery: room air DISCHARGE LOCATION:  home with home health PT/OT/nursing aide  If you experience worsening of your admission symptoms, develop shortness of breath, life threatening emergency, suicidal or homicidal thoughts you must seek medical attention immediately by calling 911 or calling your MD immediately  if symptoms less severe.  You Must read complete instructions/literature along with all the possible adverse reactions/side effects for all the Medicines you take and that have been prescribed to you. Take any new Medicines after you have completely understood and accpet all the possible adverse reactions/side effects.   Please note  You were cared for by a hospitalist during your hospital stay.  If you have any questions about your discharge medications or the care you received while you were in the hospital after you are discharged, you can call the unit and asked to speak with the hospitalist on call if the hospitalist that took care of you is not available. Once you are discharged, your primary care physician will handle any further medical issues. Please note that NO REFILLS for any discharge medications will be authorized once you are discharged, as it is imperative that you return to your primary care physician (or establish a relationship with a primary care physician if you do not have one) for your aftercare needs so that they can reassess your need for medications and monitor your lab values.    On the day of Discharge:  VITAL SIGNS:  Blood pressure 130/76, pulse 75, temperature 98.6 F (37 C), temperature source Oral, resp. rate 16, height 5\' 3"  (1.6 m), weight 68.6 kg, SpO2 98 %. PHYSICAL EXAMINATION:  GENERAL:  64 y.o.-year-old patient lying in the bed with no acute distress.  EYES: Pupils equal, round, reactive to light and accommodation. No scleral icterus. Extraocular muscles intact.  HEENT: Head atraumatic, normocephalic. Oropharynx and nasopharynx clear.  NECK:  Supple, no jugular venous distention. No thyroid enlargement, no tenderness.  LUNGS: Normal breath sounds bilaterally, no wheezing, rales,rhonchi or crepitation. No use of accessory muscles of respiration.  CARDIOVASCULAR: S1, S2 normal. No murmurs, rubs, or gallops.  ABDOMEN: Soft, non-tender, non-distended. Bowel sounds present. No organomegaly or mass.  EXTREMITIES: No pedal edema, cyanosis, or clubbing.  NEUROLOGIC: Cranial nerves II through XII are intact. Muscle strength 5/5 in all extremities. Sensation intact. Gait not checked.  PSYCHIATRIC: The patient is alert and oriented x 3.  SKIN: No obvious rash, lesion, or ulcer.  DATA REVIEW:   CBC Recent Labs  Lab 11/01/19 0400  WBC 11.3*  HGB 10.6*   HCT 33.2*  PLT 331    Chemistries  Recent Labs  Lab 10/29/19 0629 10/29/19 0629 11/01/19 0400  NA 135   < > 133*  K 4.2   < > 3.7  CL 88*   < > 92*  CO2 18*   < > 19*  GLUCOSE 58*   < > 232*  BUN 75*   < > 76*  CREATININE 20.72*   < > 15.30*  CALCIUM 6.3*   < > 7.1*  AST 24  --   --   ALT 13  --   --   ALKPHOS 31*  --   --   BILITOT 0.9  --   --    < > =  values in this interval not displayed.     Outpatient follow-up Follow-up Information    Shelly Bombard Shirline Frees, MD. Call on 11/08/2019.   Specialty: Family Medicine Why: @2 :53 PM Telephone Visit  Contact information: [place] [address] [phone]        Dionisio David, MD. Go on 11/04/2019.   Specialty: Cardiology Why: At 9 AM Contact information: [place] [address] [phone]        Vladimir Crofts, MD. Schedule an appointment as soon as possible for a visit in 2 week(s).   Specialty: Neurology Why: severe orthostatic hypotension and autonomic/DM neuropathy Contact information: [facility] [place] [address] [phone]            She is at very high risk for readmission, it is unlikely much we can offer her to fix her orthostatics here.  Management plans discussed with the patient, family and they are in agreement.  CODE STATUS: Prior   TOTAL TIME TAKING CARE OF THIS PATIENT: 45 minutes.    Max Sane M.D on 11/02/2019 at 3:34 PM  [facility]   CC: Primary care physician; Bynum, Gershon Crane, MD   Note: This dictation was prepared with Dragon dictation along with smaller phrase technology. Any transcriptional errors that result from this process are unintentional.

## 2019-11-03 ENCOUNTER — Other Ambulatory Visit: Payer: Self-pay

## 2019-11-03 ENCOUNTER — Inpatient Hospital Stay (HOSPITAL_COMMUNITY)
Admission: EM | Admit: 2019-11-03 | Discharge: 2019-11-09 | Disposition: A | Discharge status: Home / Self Care | Payer: BC Managed Care – PPO | Source: Home / Self Care | Attending: Family Medicine | Admitting: Family Medicine

## 2019-11-03 DIAGNOSIS — E1169 Type 2 diabetes mellitus with other specified complication: Secondary | ICD-10-CM

## 2019-11-03 DIAGNOSIS — N186 End stage renal disease: Secondary | ICD-10-CM

## 2019-11-03 DIAGNOSIS — I10 Essential (primary) hypertension: Secondary | ICD-10-CM

## 2019-11-03 DIAGNOSIS — R55 Syncope and collapse: Secondary | ICD-10-CM

## 2019-11-03 DIAGNOSIS — R531 Weakness: Secondary | ICD-10-CM

## 2019-11-03 DIAGNOSIS — E1143 Type 2 diabetes mellitus with diabetic autonomic (poly)neuropathy: Secondary | ICD-10-CM

## 2019-11-03 DIAGNOSIS — U071 COVID-19: Secondary | ICD-10-CM | POA: Diagnosis present

## 2019-11-03 DIAGNOSIS — Z992 Dependence on renal dialysis: Secondary | ICD-10-CM

## 2019-11-03 DIAGNOSIS — Z789 Other specified health status: Secondary | ICD-10-CM

## 2019-11-03 DIAGNOSIS — I951 Orthostatic hypotension: Secondary | ICD-10-CM

## 2019-11-03 DIAGNOSIS — G903 Multi-system degeneration of the autonomic nervous system: Secondary | ICD-10-CM

## 2019-11-03 LAB — COMPREHENSIVE METABOLIC PANEL
ALT: 17 U/L (ref 0–44)
AST: 21 U/L (ref 15–41)
Albumin: 2.5 g/dL — ABNORMAL LOW (ref 3.5–5.0)
Alkaline Phosphatase: 44 U/L (ref 38–126)
Anion gap: 20 — ABNORMAL HIGH (ref 5–15)
BUN: 100 mg/dL — ABNORMAL HIGH (ref 8–23)
CO2: 23 mmol/L (ref 22–32)
Calcium: 6.8 mg/dL — ABNORMAL LOW (ref 8.9–10.3)
Chloride: 92 mmol/L — ABNORMAL LOW (ref 98–111)
Creatinine, Ser: 15.21 mg/dL — ABNORMAL HIGH (ref 0.44–1.00)
GFR calc Af Amer: 3 mL/min — ABNORMAL LOW (ref >60)
GFR calc non Af Amer: 2 mL/min — ABNORMAL LOW (ref >60)
Glucose, Bld: 68 mg/dL — ABNORMAL LOW (ref 70–99)
Potassium: 4.3 mmol/L (ref 3.5–5.1)
Sodium: 135 mmol/L (ref 135–145)
Total Bilirubin: 0.8 mg/dL (ref 0.3–1.2)
Total Protein: 6 g/dL — ABNORMAL LOW (ref 6.5–8.1)

## 2019-11-03 LAB — CBC WITH DIFFERENTIAL/PLATELET
Abs Immature Granulocytes: 0.45 10*3/uL — ABNORMAL HIGH (ref 0.00–0.07)
Basophils Absolute: 0 10*3/uL (ref 0.0–0.1)
Basophils Relative: 0 %
Eosinophils Absolute: 0.1 10*3/uL (ref 0.0–0.5)
Eosinophils Relative: 1 %
HCT: 37.5 % (ref 36.0–46.0)
Hemoglobin: 11.9 g/dL — ABNORMAL LOW (ref 12.0–15.0)
Immature Granulocytes: 4 %
Lymphocytes Relative: 10 %
Lymphs Abs: 1.2 10*3/uL (ref 0.7–4.0)
MCH: 29.1 pg (ref 26.0–34.0)
MCHC: 31.7 g/dL (ref 30.0–36.0)
MCV: 91.7 fL (ref 80.0–100.0)
Monocytes Absolute: 0.8 10*3/uL (ref 0.1–1.0)
Monocytes Relative: 7 %
Neutro Abs: 9.1 10*3/uL — ABNORMAL HIGH (ref 1.7–7.7)
Neutrophils Relative %: 78 %
Platelets: 285 10*3/uL (ref 150–400)
RBC: 4.09 MIL/uL (ref 3.87–5.11)
RDW: 15.4 % (ref 11.5–15.5)
WBC: 11.6 10*3/uL — ABNORMAL HIGH (ref 4.0–10.5)
nRBC: 0.2 % (ref 0.0–0.2)

## 2019-11-03 LAB — LACTIC ACID, PLASMA: Lactic Acid, Venous: 0.7 mmol/L (ref 0.5–1.9)

## 2019-11-03 LAB — PROTIME-INR
INR: 1.1 (ref 0.8–1.2)
Prothrombin Time: 13.8 s (ref 11.4–15.2)

## 2019-11-03 LAB — GLUCOSE, CAPILLARY: Glucose-Capillary: 87 mg/dL (ref 70–99)

## 2019-11-03 MED ORDER — ONDANSETRON HCL 4 MG/2ML IJ SOLN
4.0000 mg | Freq: Once | INTRAMUSCULAR | Status: AC
  Administered 2019-11-03: 4 mg via INTRAVENOUS
  Filled 2019-11-03: qty 2

## 2019-11-03 MED ORDER — PREDNISONE 10 MG (21) PO TBPK
ORAL_TABLET | Freq: Every day | ORAL | Status: DC
  Filled 2019-11-03: qty 21

## 2019-11-03 MED ORDER — INSULIN ASPART 100 UNIT/ML ~~LOC~~ SOLN
0.0000 [IU] | Freq: Three times a day (TID) | SUBCUTANEOUS | Status: DC
  Administered 2019-11-04: 1 [IU] via SUBCUTANEOUS
  Administered 2019-11-05: 2 [IU] via SUBCUTANEOUS
  Administered 2019-11-05: 3 [IU] via SUBCUTANEOUS
  Administered 2019-11-05 – 2019-11-06 (×2): 4 [IU] via SUBCUTANEOUS
  Filled 2019-11-03 (×4): qty 1

## 2019-11-03 MED ORDER — INSULIN ASPART 100 UNIT/ML ~~LOC~~ SOLN
0.0000 [IU] | Freq: Every day | SUBCUTANEOUS | Status: DC
  Administered 2019-11-04: 3 [IU] via SUBCUTANEOUS
  Administered 2019-11-05: 22:00:00 4 [IU] via SUBCUTANEOUS
  Filled 2019-11-03: qty 1

## 2019-11-03 MED ORDER — ATORVASTATIN CALCIUM 20 MG PO TABS
40.0000 mg | ORAL_TABLET | Freq: Every day | ORAL | Status: DC
  Administered 2019-11-04 – 2019-11-05 (×2): 40 mg via ORAL
  Filled 2019-11-03 (×2): qty 2

## 2019-11-03 MED ORDER — LACTATED RINGERS IV BOLUS
500.0000 mL | Freq: Once | INTRAVENOUS | Status: AC
  Administered 2019-11-03: 500 mL via INTRAVENOUS

## 2019-11-03 MED ORDER — CLOPIDOGREL BISULFATE 75 MG PO TABS
75.0000 mg | ORAL_TABLET | Freq: Every day | ORAL | Status: DC
  Administered 2019-11-03 – 2019-11-09 (×7): 75 mg via ORAL
  Filled 2019-11-03 (×7): qty 1

## 2019-11-03 MED ORDER — ASPIRIN EC 81 MG PO TBEC
81.0000 mg | DELAYED_RELEASE_TABLET | Freq: Every day | ORAL | Status: DC
  Administered 2019-11-03 – 2019-11-06 (×4): 81 mg via ORAL
  Filled 2019-11-03 (×4): qty 1

## 2019-11-03 MED ORDER — ONDANSETRON HCL 4 MG PO TABS: 4.0000 mg | ORAL_TABLET | Freq: Four times a day (QID) | ORAL | Status: DC | PRN

## 2019-11-03 MED ORDER — ZINC SULFATE 220 (50 ZN) MG PO CAPS
220.0000 mg | ORAL_CAPSULE | Freq: Every day | ORAL | Status: DC
  Administered 2019-11-03 – 2019-11-05 (×3): 220 mg via ORAL
  Filled 2019-11-03 (×3): qty 1

## 2019-11-03 MED ORDER — ACETAMINOPHEN 500 MG PO TABS
1000.0000 mg | ORAL_TABLET | Freq: Once | ORAL | Status: AC
  Administered 2019-11-03: 1000 mg via ORAL
  Filled 2019-11-03: qty 2

## 2019-11-03 MED ORDER — SENNOSIDES-DOCUSATE SODIUM 8.6-50 MG PO TABS: 1.0000 | ORAL_TABLET | Freq: Every evening | ORAL | Status: DC | PRN

## 2019-11-03 MED ORDER — ASCORBIC ACID 500 MG PO TABS
500.0000 mg | ORAL_TABLET | Freq: Every day | ORAL | Status: DC
  Administered 2019-11-03 – 2019-11-05 (×3): 500 mg via ORAL
  Filled 2019-11-03 (×3): qty 1

## 2019-11-03 MED ORDER — ACETAMINOPHEN 650 MG RE SUPP: 650.0000 mg | Freq: Four times a day (QID) | RECTAL | Status: DC | PRN

## 2019-11-03 MED ORDER — CALCIUM ACETATE (PHOS BINDER) 667 MG PO CAPS
1334.0000 mg | ORAL_CAPSULE | Freq: Three times a day (TID) | ORAL | Status: DC
  Administered 2019-11-04 – 2019-11-07 (×10): 1334 mg via ORAL
  Filled 2019-11-03 (×12): qty 2

## 2019-11-03 MED ORDER — CARVEDILOL 25 MG PO TABS
25.0000 mg | ORAL_TABLET | Freq: Two times a day (BID) | ORAL | Status: DC
  Administered 2019-11-03 – 2019-11-05 (×5): 25 mg via ORAL
  Filled 2019-11-03 (×5): qty 1

## 2019-11-03 MED ORDER — ADULT MULTIVITAMIN W/MINERALS CH
1.0000 | ORAL_TABLET | Freq: Every day | ORAL | Status: DC
  Administered 2019-11-03 – 2019-11-05 (×3): 1 via ORAL
  Filled 2019-11-03 (×3): qty 1

## 2019-11-03 MED ORDER — ONDANSETRON HCL 4 MG/2ML IJ SOLN
4.0000 mg | Freq: Four times a day (QID) | INTRAMUSCULAR | Status: DC | PRN
  Administered 2019-11-06 – 2019-11-07 (×2): 4 mg via INTRAVENOUS
  Filled 2019-11-03 (×2): qty 2

## 2019-11-03 MED ORDER — GUAIFENESIN-DM 100-10 MG/5ML PO SYRP
10.0000 mL | ORAL_SOLUTION | ORAL | Status: DC | PRN
  Administered 2019-11-07 – 2019-11-08 (×2): 10 mL via ORAL
  Filled 2019-11-03 (×3): qty 10

## 2019-11-03 MED ORDER — CALCITRIOL 0.25 MCG PO CAPS
0.2500 ug | ORAL_CAPSULE | Freq: Every day | ORAL | Status: DC
  Administered 2019-11-04 – 2019-11-07 (×4): 0.25 ug via ORAL
  Filled 2019-11-03 (×4): qty 1

## 2019-11-03 MED ORDER — ACETAMINOPHEN 325 MG PO TABS
650.0000 mg | ORAL_TABLET | Freq: Four times a day (QID) | ORAL | Status: DC | PRN
  Administered 2019-11-04 – 2019-11-08 (×2): 650 mg via ORAL
  Filled 2019-11-03 (×2): qty 2

## 2019-11-03 NOTE — ED Triage Notes (Signed)
Patient arrived via EMS from home, patient is AOx4 and ambulatory. Patient chief complaint is generalized weakness. Patient stated she had syncopal episode which lasted about 1 minute patient assumed. Patient was recently released from [facility] yesterday after being treated for COVID.   Patient is Dyalisis patient and Type 2 Diabetic.   Patient in no apparent physical distress at this time.

## 2019-11-03 NOTE — ED Provider Notes (Signed)
[facility] Emergency Department Provider Note   ____________________________________________   First MD Initiated Contact with Patient 11/03/19 1811     (approximate)  I have reviewed the triage vital signs and the nursing notes.   HISTORY  Chief Complaint Generalized Weakness    HPI Tracy Jennings is a 64 y.o. female with past history of ESRD on PD, diabetes, hypertension, and hyperlipidemia who presents to the ED complaining of generalized weakness.  Patient states that she was discharged from the hospital yesterday and has had difficulty caring for herself at home.  She states she continues to feel very weak and is dizzy when she attempts to stand up and walk.  She denies any fevers, cough, chest pain, or shortness of breath.  She completed treatment for COVID-19 with remdesivir while admitted to the hospital and states her breathing has been doing well since then.  She feels like she is getting dehydrated because she is also been feeling nauseous with a very poor appetite.  She states she has been performing her peritoneal dialysis as usual and denies any abdominal pain.        Past Medical History:  Diagnosis Date  . Anemia 04/2018   low iron. to be started on supplements  . CKD (chronic kidney disease)    Stage IV  . COVID-19 virus detected 10/28/2019  . Diabetes mellitus without complication ([place])    type II  . ESRD (end stage renal disease) ([place])   . Heart murmur    followed as a child only  . Hyperlipidemia associated with type 2 diabetes mellitus ([place])   . Hypertension   . Pancreatitis   . Peripheral vascular disease [facility])     Patient Active Problem List   Diagnosis Date Noted  . 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 Type 2 diabetes mellitus with other specified complication ([place]) 98/33/8250  . Peritoneal dialysis status ([place]) 11/03/2019  . Unable to care for self 11/03/2019  . Supine hypertension  11/03/2019  . Metabolic acidosis, increased anion gap (IAG)   . COVID-19   . Generalized weakness   . Recurrent syncope 10/28/2019  . 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 Complication of vascular access for dialysis 05/11/2018  . Respiratory arrest ([place]) 05/11/2018  . Hyperlipidemia 04/12/2018  . Pancreatitis, recurrent 11/01/2017  . HTN (hypertension) 11/01/2017  . ESRD (end stage renal disease) ([place]) 11/01/2017  . Recurrent pancreatitis 11/01/2017    Past Surgical History:  Procedure Laterality Date  . AMPUTATION TOE Left 2013   2nd toe. tip of toe (toe nail was infected)  . AV FISTULA PLACEMENT Left 05/11/2018   Procedure: ARTERIOVENOUS (AV) FISTULA CREATION;  Surgeon: Katha Cabal, MD;  Location: [facility] ORS;  Service: Vascular;  Laterality: Left;  . CATARACT EXTRACTION    . CHOLECYSTECTOMY  2014  . COLONOSCOPY    . COLONOSCOPY WITH PROPOFOL N/A 01/10/2018   Procedure: COLONOSCOPY WITH PROPOFOL;  Surgeon: Toledo, Benay Pike, MD;  Location: [facility] ENDOSCOPY;  Service: Gastroenterology;  Laterality: N/A;  . DIALYSIS/PERMA CATHETER INSERTION N/A 05/21/2018   Procedure: DIALYSIS/PERMA CATHETER INSERTION;  Surgeon: Algernon Huxley, MD;  Location: [place] CV LAB;  Service: Cardiovascular;  Laterality: N/A;  . DIALYSIS/PERMA CATHETER REMOVAL N/A 04/11/2019   Procedure: DIALYSIS/PERMA CATHETER REMOVAL;  Surgeon: Algernon Huxley, MD;  Location: [place] CV LAB;  Service: Cardiovascular;  Laterality: N/A;  . EYE SURGERY Bilateral 2018   cataract extractions  . THROMBECTOMY W/ EMBOLECTOMY  05/11/2018  Procedure: THROMBECTOMY ARTERIOVENOUS FISTULA;  Surgeon: Katha Cabal, MD;  Location: [facility] ORS;  Service: Vascular;;  . TUBAL LIGATION  1984    Prior to Admission medications   Medication Sig Start Date End Date Taking? Authorizing Provider  aspirin EC 81 MG tablet Take 81 mg by mouth daily. 09/27/17   [provider]  atorvastatin (LIPITOR) 40 MG  tablet Take 40 mg by mouth daily at 6 PM.  10/05/15 07/02/18  [provider]  bumetanide (BUMEX) 1 MG tablet Take 2 mg by mouth 2 (two) times daily.  02/28/18 02/28/19  [provider]  calcitRIOL (ROCALTROL) 0.25 MCG capsule Take 0.25 mcg by mouth daily. 09/26/19   [provider]  calcium acetate (PHOSLO) 667 MG capsule Take 1,334 mg by mouth 3 (three) times daily. 08/20/19   [provider]  carvedilol (COREG) 25 MG tablet Take 25 mg by mouth 2 (two) times daily. 06/15/19   [provider]  cloNIDine (CATAPRES) 0.2 MG tablet Take 1 tablet (0.2 mg total) by mouth 2 (two) times daily. 11/01/19   Max Sane, MD  clopidogrel (PLAVIX) 75 MG tablet Take 75 mg by mouth daily.    [provider]  doxazosin (CARDURA) 4 MG tablet Take 2 mg by mouth at bedtime. 09/26/19   [provider]  fluticasone (FLONASE) 50 MCG/ACT nasal spray 2 sprays by Each Nare route daily. 01/15/18 01/15/19  [provider]  furosemide (LASIX) 80 MG tablet Take 80 mg by mouth daily. 07/28/19   [provider]  glucose blood (PRECISION QID TEST) test strip Use once daily One Touch Ultra Blue 09/27/17   [provider]  insulin aspart (NOVOLOG) 100 UNIT/ML FlexPen Inject 10-12 Units into the skin 3 (three) times daily with meals.  12/28/15   [provider]  Insulin Glargine 300 UNIT/ML SOPN Inject 40 Units into the skin daily. In the morning.  Nelva Nay) 11/30/15   [provider]  losartan (COZAAR) 25 MG tablet Take 1 tablet (25 mg total) by mouth daily. 05/25/18 07/02/18  Saundra Shelling, MD  prazosin (MINIPRESS) 1 MG capsule Take 1 mg by mouth at bedtime. 10/23/19   [provider]  predniSONE (STERAPRED UNI-PAK 21 TAB) 10 MG (21) TBPK tablet Start 60 mg p.o. daily, taper 10 mg daily until done 11/01/19   Max Sane, MD  valsartan (DIOVAN) 160 MG tablet Take 160 mg by mouth daily. 07/28/19   [provider]  Vitamin D,  Ergocalciferol, (DRISDOL) 50000 units CAPS capsule TAKE ONE CAPSULE BY MOUTH ONE TIME PER WEEK 01/06/18   [provider]  diltiazem (CARDIZEM CD) 120 MG 24 hr capsule Take 120 mg by mouth daily. 09/26/19 10/29/19  [provider]    Allergies Amlodipine, Hctz [hydrochlorothiazide], Heparin, Lmw heparin, Other, Amlodipine besylate, Sulfa antibiotics, Dairy aid [lactase], and Hydralazine  Family History  Problem Relation Age of Onset  . Stroke Mother   . Hypertension Mother   . Gout Mother   . Cancer Father   . Diabetes Sister   . Diabetes Maternal Grandmother   . Diabetes Maternal Grandfather   . Diabetes Paternal Grandmother   . Diabetes Paternal Grandfather     Social History Social History   Tobacco Use  . Smoking status: Never Smoker  . Smokeless tobacco: Never Used  Substance Use Topics  . Alcohol use: No  . Drug use: Never    Review of Systems  Constitutional: No fever/chills.  Positive for generalized weakness and lightheadedness. Eyes:  No visual changes. ENT: No sore throat. Cardiovascular: Denies chest pain. Respiratory: Denies shortness of breath. Gastrointestinal: No abdominal pain.  Positive for nausea, no vomiting.  No diarrhea.  No constipation. Genitourinary: Negative for dysuria. Musculoskeletal: Negative for back pain. Skin: Negative for rash. Neurological: Negative for headaches, focal weakness or numbness.  ____________________________________________   PHYSICAL EXAM:  VITAL SIGNS: ED Triage Vitals  Enc Vitals Group     BP 11/03/19 1738 (!) 161/83     Pulse Rate 11/03/19 1738 73     Resp 11/03/19 1738 12     Temp 11/03/19 1738 98.3 F (36.8 C)     Temp Source 11/03/19 1738 Oral     SpO2 11/03/19 1726 100 %     Weight 11/03/19 1731 151 lb 3.8 oz (68.6 kg)     Height 11/03/19 1731 5\' 3"  (1.6 m)     Head Circumference --      Peak Flow --      Pain Score 11/03/19 1730 3     Pain Loc --      Pain Edu? --      Excl. in [place]?  --     Constitutional: Alert and oriented. Eyes: Conjunctivae are normal. Head: Atraumatic. Nose: No congestion/rhinnorhea. Mouth/Throat: Mucous membranes are moist. Neck: Normal ROM Cardiovascular: Normal rate, regular rhythm. Grossly normal heart sounds. Respiratory: Normal respiratory effort.  No retractions. Lungs CTAB. Gastrointestinal: Soft and nontender. No distention.  PD catheter in place with no associated tenderness. Genitourinary: deferred Musculoskeletal: No lower extremity tenderness nor edema. Neurologic:  Normal speech and language. No gross focal neurologic deficits are appreciated. Skin:  Skin is warm, dry and intact. No rash noted. Psychiatric: Mood and affect are normal. Speech and behavior are normal.  ____________________________________________   LABS (all labs ordered are listed, but only abnormal results are displayed)  Labs Reviewed  COMPREHENSIVE METABOLIC PANEL - Abnormal; Notable for the following components:      Result Value   Chloride 92 (*)    Glucose, Bld 68 (*)    BUN 100 (*)    Creatinine, Ser 15.21 (*)    Calcium 6.8 (*)    Total Protein 6.0 (*)    Albumin 2.5 (*)    GFR calc non Af Amer 2 (*)    GFR calc Af Amer 3 (*)    Anion gap 20 (*)    All other components within normal limits  CBC WITH DIFFERENTIAL/PLATELET - Abnormal; Notable for the following components:   WBC 11.6 (*)    Hemoglobin 11.9 (*)    Neutro Abs 9.1 (*)    Abs Immature Granulocytes 0.45 (*)    All other components within normal limits  PROTIME-INR  LACTIC ACID, PLASMA  GLUCOSE, CAPILLARY  URINALYSIS, COMPLETE (UACMP) WITH MICROSCOPIC  BASIC METABOLIC PANEL  CBC  HEMOGLOBIN A1C   ____________________________________________  EKG  ED ECG REPORT I, Blake Divine, the attending physician, personally viewed and interpreted this ECG.   Date: 11/04/2019  EKG Time: 17:29  Rate: 73  Rhythm: normal sinus rhythm  Axis: Normal  Intervals:none  ST&T Change:  None   PROCEDURES  Procedure(s) performed (including Critical Care):  Procedures   ____________________________________________   INITIAL IMPRESSION / ASSESSMENT AND PLAN / ED COURSE       64 year old female with history of ESRD on PD presents to the ED for ongoing weakness and nausea following recent discharge from the hospital following diagnosis with COVID-19.  She is asymptomatic from a respiratory standpoint, but  feels very weak and gets lightheaded chest raising to a seated position.  She has dealt with significant orthostatic hypotension in the past, difficult to manage given her ESRD.  Lab work here in the ED is unremarkable, however given her weakness and COVID-19 diagnosis, case was discussed with hospitalist, who accepts patient for admission.      ____________________________________________   FINAL CLINICAL IMPRESSION(S) / ED DIAGNOSES  Final diagnoses:  Generalized weakness  Orthostatic hypotension  ESRD (end stage renal disease) ([place])  COVID-19     ED Discharge Orders    None       Note:  This document was prepared using Dragon voice recognition software and may include unintentional dictation errors.   Blake Divine, MD 11/04/19 [phone]

## 2019-11-03 NOTE — ED Notes (Signed)
Patient given apple juice at this time

## 2019-11-03 NOTE — H&P (Signed)
History and Physical    [name]:[id number] DOB: September 21, 1955 DOA: 11/03/2019  PCP: Ricardo Jericho, NP   Patient coming from: home I have personally briefly reviewed patient's old medical records in [place]  Chief Complaint: weakness, syncope, inability to care for self  HPI: Tracy Jennings is a 64 y.o. female with medical history significant for ESRD on peritoneal dialysis, hypertension, diabetes, hospitalized from 2/15 to 11/01/2019 following a syncopal episode, testing positive for Covid completing 5 days of remdesivir, discharged with  prednisone taper pack, who returns to the emergency room with a complaint of another syncopal episode. She also has persistent weakness, decreased appetite and feels unable to care for herself in the home.  During her recent hospitalization, her syncope was thought secondary to autonomic dysfunction related to her diabetes. compressive stockings were recommended. Florinef and midodrine were considered but not stated because of concern for severe supine hypertension for which antihypertensives including Bumex, clonidine, Coreg, doxazosin Lasix and losartan were continued.  Patient denies cough, shortness of breath, fever chills chest pain, nausea vomiting or diarrhea.  States she has mild, diffuse, nonradiating abdominal pain that was there prior to her previous hospitalization. ED Course: On arrival in the emergency room she was awake and alert.  While ambulatory on arrival, she was unable to stand efficiently for orthostatic vital signs.  Supine blood pressure was 159/85, HR 69, RR 12 with O2 sat 100% on room air.  Blood work as expected for dialysis patient, with normal serum bicarb and potassium.  Her white cell count was slightly elevated at 11.6.  Lactic acid was 0.7.  EKG showed normal sinus rhythm with no acute ST-T wave changes.  Review of Systems: As per HPI otherwise 10 point review of systems negative.    Past Medical  History:  Diagnosis Date  . Anemia 04/2018   low iron. to be started on supplements  . CKD (chronic kidney disease)    Stage IV  . COVID-19 virus detected 10/28/2019  . Diabetes mellitus without complication ([name])    type II  . ESRD (end stage renal disease) ([place])   . Heart murmur    followed as a child only  . Hyperlipidemia associated with type 2 diabetes mellitus ([name])   . Hypertension   . Pancreatitis   . Peripheral vascular disease [facility])     Past Surgical History:  Procedure Laterality Date  . AMPUTATION TOE Left 2013   2nd toe. tip of toe (toe nail was infected)  . AV FISTULA PLACEMENT Left 05/11/2018   Procedure: ARTERIOVENOUS (AV) FISTULA CREATION;  Surgeon: Katha Cabal, MD;  Location: [facility] ORS;  Service: Vascular;  Laterality: Left;  . CATARACT EXTRACTION    . CHOLECYSTECTOMY  2014  . COLONOSCOPY    . COLONOSCOPY WITH PROPOFOL N/A 01/10/2018   Procedure: COLONOSCOPY WITH PROPOFOL;  Surgeon: [place], Benay Pike, MD;  Location: [facility] ENDOSCOPY;  Service: Gastroenterology;  Laterality: N/A;  . DIALYSIS/PERMA CATHETER INSERTION N/A 05/21/2018   Procedure: DIALYSIS/PERMA CATHETER INSERTION;  Surgeon: Algernon Huxley, MD;  Location: [place] CV LAB;  Service: Cardiovascular;  Laterality: N/A;  . DIALYSIS/PERMA CATHETER REMOVAL N/A 04/11/2019   Procedure: DIALYSIS/PERMA CATHETER REMOVAL;  Surgeon: Algernon Huxley, MD;  Location: [place] CV LAB;  Service: Cardiovascular;  Laterality: N/A;  . EYE SURGERY Bilateral 2018   cataract extractions  . THROMBECTOMY W/ EMBOLECTOMY  05/11/2018   Procedure: THROMBECTOMY ARTERIOVENOUS FISTULA;  Surgeon: Katha Cabal, MD;  Location[facility]  ORS;  Service: Vascular;;  . TUBAL LIGATION  1984     reports that she has never smoked. She has never used smokeless tobacco. She reports that she does not drink alcohol or use drugs.  Allergies  Allergen Reactions  . Amlodipine Swelling    Knees down to ankles Knees down to ankles  . Hctz  [Hydrochlorothiazide]     pancreatitis  . Heparin Other (See Comments)    Pt reports cardiac arrest when given heparin  . Lmw Heparin     Reports cardiac arrest when given heparin  . Other Other (See Comments)    Reports cardiac arrest when given during surgery Reports cardiac arrest when given during surgery Reports cardiac arrest when given during surgery   . Amlodipine Besylate Swelling  . Sulfa Antibiotics Rash  . Dairy Aid [Lactase]     Runny nose  . Hydralazine Nausea Only    Family History  Problem Relation Age of Onset  . Stroke Mother   . Hypertension Mother   . Gout Mother   . Cancer Father   . Diabetes Sister   . Diabetes Maternal Grandmother   . Diabetes Maternal Grandfather   . Diabetes Paternal Grandmother   . Diabetes Paternal Grandfather      Prior to Admission medications   Medication Sig Start Date End Date Taking? Authorizing Provider  aspirin EC 81 MG tablet Take 81 mg by mouth daily. 09/27/17   [provider]  atorvastatin (LIPITOR) 40 MG tablet Take 40 mg by mouth daily at 6 PM.  10/05/15 07/02/18  [provider]  bumetanide (BUMEX) 1 MG tablet Take 2 mg by mouth 2 (two) times daily.  02/28/18 02/28/19  [provider]  calcitRIOL (ROCALTROL) 0.25 MCG capsule Take 0.25 mcg by mouth daily. 09/26/19   [provider]  calcium acetate (PHOSLO) 667 MG capsule Take 1,334 mg by mouth 3 (three) times daily. 08/20/19   [provider]  carvedilol (COREG) 25 MG tablet Take 25 mg by mouth 2 (two) times daily. 06/15/19   [provider]  cloNIDine (CATAPRES) 0.2 MG tablet Take 1 tablet (0.2 mg total) by mouth 2 (two) times daily. 11/01/19   Max Sane, MD  clopidogrel (PLAVIX) 75 MG tablet Take 75 mg by mouth daily.    [provider]  doxazosin (CARDURA) 4 MG tablet Take 2 mg by mouth at bedtime. 09/26/19   [provider]  fluticasone (FLONASE) 50 MCG/ACT nasal spray 2 sprays by Each Nare route  daily. 01/15/18 01/15/19  [provider]  furosemide (LASIX) 80 MG tablet Take 80 mg by mouth daily. 07/28/19   [provider]  glucose blood (PRECISION QID TEST) test strip Use once daily One Touch Ultra Blue 09/27/17   [provider]  insulin aspart (NOVOLOG) 100 UNIT/ML FlexPen Inject 10-12 Units into the skin 3 (three) times daily with meals.  12/28/15   [provider]  Insulin Glargine 300 UNIT/ML SOPN Inject 40 Units into the skin daily. In the morning.  Nelva Nay) 11/30/15   [provider]  losartan (COZAAR) 25 MG tablet Take 1 tablet (25 mg total) by mouth daily. 05/25/18 07/02/18  Saundra Shelling, MD  prazosin (MINIPRESS) 1 MG capsule Take 1 mg by mouth at bedtime. 10/23/19   [provider]  predniSONE (STERAPRED UNI-PAK 21 TAB) 10 MG (21) TBPK tablet Start 60 mg p.o. daily, taper 10 mg daily until done 11/01/19   Max Sane, MD  valsartan (DIOVAN) 160 MG  tablet Take 160 mg by mouth daily. 07/28/19   [provider]  Vitamin D, Ergocalciferol, (DRISDOL) 50000 units CAPS capsule TAKE ONE CAPSULE BY MOUTH ONE TIME PER WEEK 01/06/18   [provider]  diltiazem (CARDIZEM CD) 120 MG 24 hr capsule Take 120 mg by mouth daily. 09/26/19 10/29/19  [provider]    Physical Exam: Vitals:   11/03/19 1940 11/03/19 1943 11/03/19 2000 11/03/19 2030  BP: 137/75 116/70 (!) 159/85 (!) 146/73  Pulse: 74  64   Resp: 16 16 (!) 22   Temp:      TempSrc:      SpO2: 96%  97%   Weight:      Height:         Vitals:   11/03/19 1940 11/03/19 1943 11/03/19 2000 11/03/19 2030  BP: 137/75 116/70 (!) 159/85 (!) 146/73  Pulse: 74  64   Resp: 16 16 (!) 22   Temp:      TempSrc:      SpO2: 96%  97%   Weight:      Height:        Constitutional: NAD, alert and oriented x 3 Eyes: PERRL, lids and conjunctivae normal ENMT: Mucous membranes are moist.  Neck: normal, supple, no masses, no thyromegaly Respiratory: clear to auscultation  bilaterally, no wheezing, no crackles. Normal respiratory effort. No accessory muscle use.  Cardiovascular: Regular rate and rhythm, no murmurs / rubs / gallops. No extremity edema. 2+ pedal pulses. No carotid bruits.  Abdomen: no tenderness, no masses palpated. No hepatosplenomegaly. Bowel sounds positive. PD site covered in dressing but not tender on palpation Musculoskeletal: no clubbing / cyanosis. No joint deformity upper and lower extremities.  Skin: no rashes, lesions, ulcers.  Neurologic: No gross focal neurologic deficit. Psychiatric: Normal mood and affect.   Labs on Admission: I have personally reviewed following labs and imaging studies  CBC: Recent Labs  Lab 10/28/19 2055 10/29/19 0629 11/01/19 0400 11/03/19 1736  WBC 9.6 8.1 11.3* 11.6*  NEUTROABS 8.8*  --   --  9.1*  HGB 10.4* 9.9* 10.6* 11.9*  HCT 32.1* 30.2* 33.2* 37.5  MCV 89.9 89.3 90.7 91.7  PLT 252 267 331 993   Basic Metabolic Panel: Recent Labs  Lab 10/28/19 2055 10/29/19 0629 11/01/19 0400 11/03/19 1736  NA 136 135 133* 135  K 4.7 4.2 3.7 4.3  CL 90* 88* 92* 92*  CO2 20* 18* 19* 23  GLUCOSE 108* 58* 232* 68*  BUN 68* 75* 76* 100*  CREATININE 20.43* 20.72* 15.30* 15.21*  CALCIUM 6.6* 6.3* 7.1* 6.8*   GFR: Estimated Creatinine Clearance: 3.6 mL/min (A) (by C-G formula based on SCr of 15.21 mg/dL (H)). Liver Function Tests: Recent Labs  Lab 10/28/19 2055 10/29/19 0629 11/03/19 1736  AST 26 24 21   ALT 16 13 17   ALKPHOS 35* 31* 44  BILITOT 0.9 0.9 0.8  PROT 6.6 6.6 6.0*  ALBUMIN 2.6* 2.7* 2.5*   No results for input(s): LIPASE, AMYLASE in the last 168 hours. No results for input(s): AMMONIA in the last 168 hours. Coagulation Profile: Recent Labs  Lab 11/03/19 1736  INR 1.1   Cardiac Enzymes: No results for input(s): CKTOTAL, CKMB, CKMBINDEX, TROPONINI in the last 168 hours. BNP (last 3 results) No results for input(s): PROBNP in the last 8760 hours. HbA1C: No results for  input(s): HGBA1C in the last 72 hours. CBG: Recent Labs  Lab 10/31/19 1604 10/31/19 2017 11/01/19 0344 11/01/19 0728 11/01/19 1112  GLUCAP 128*  207* 227* 191* 142*   Lipid Profile: No results for input(s): CHOL, HDL, LDLCALC, TRIG, CHOLHDL, LDLDIRECT in the last 72 hours. Thyroid Function Tests: No results for input(s): TSH, T4TOTAL, FREET4, T3FREE, THYROIDAB in the last 72 hours. Anemia Panel: No results for input(s): VITAMINB12, FOLATE, FERRITIN, TIBC, IRON, RETICCTPCT in the last 72 hours. Urine analysis:    Component Value Date/Time   COLORURINE YELLOW (A) 10/31/2017 1959   APPEARANCEUR HAZY (A) 10/31/2017 1959   APPEARANCEUR Hazy 12/09/2013 1046   LABSPEC 1.017 10/31/2017 1959   LABSPEC 1.031 12/09/2013 1046   PHURINE 6.0 10/31/2017 1959   GLUCOSEU >=500 (A) 10/31/2017 1959   GLUCOSEU >=500 12/09/2013 1046   [place] 10/31/2017 [place] NEGATIVE 10/31/2017 1959   BILIRUBINUR Negative 12/09/2013 1046   [place] 10/31/2017 1959   PROTEINUR >=300 (A) 10/31/2017 1959   NITRITE NEGATIVE 10/31/2017 1959   LEUKOCYTESUR NEGATIVE 10/31/2017 1959   LEUKOCYTESUR Negative 12/09/2013 1046    Radiological Exams on Admission: No results found.  EKG: Independently reviewed.   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  Counseled on the importance of getting up slowly having something to hold on when getting up, moving off slowly. Advised on asking for assistance when getting out of bed -Hold all antihypertensives including Coreg, losartan, furosemide, Cardura and clonidine -Continue to weigh risk of recurrent syncope versus risk of severe supine  hypertension. -Might be safe to maintain a systolic blood pressure above 180, going forward, albeit with its inherent increased risk for cardiovascular and cerebrovascular events --reconsider sodium restriction in diet  Generalized weakness  Unable to care for self Decreased appetite in the setting of recent Covid 19 pneumonia -Likely secondary to severe orthostatic hypotension in association with recent COVID-19 infection -Physical therapy, nutritionist and transitional care input    COVID-19 positive on 10/28/2019 -Patient completed 4 5 remdesivir treatment on 2/19/2 -Has residual weakness but no shortness of breath -Continue vitamin C, zinc and multivitamin -Prednisone taper pack resumed    Type 2 diabetes mellitus with complications -Hemoglobin A1c on 10/29/2019 was 5.9 consistent with aggressive glycemic control -In the setting of decreased appetite, will hold home basal insulin and hypoglycemics -Insulin sliding scale coverage    Peritoneal dialysis status [facility]) -Nephrology, dr. Juleen China consulted for continuation of peritoneal dialysis.     DVT prophylaxis: TEDS. patient has severe allergy to heparin products Code Status: full code  Family Communication: none  Disposition Plan: Back to previous home environment Consults called: Nephrology, Dr. Mervyn Gay MD [facility]     11/03/2019, 9:06 PM

## 2019-11-03 NOTE — ED Notes (Signed)
Patient unable to stand for orthostatic vital signs.

## 2019-11-03 NOTE — ED Notes (Signed)
Patient's husband given an update on patient.

## 2019-11-03 NOTE — ED Notes (Signed)
Dr. Jessup at the bedside 

## 2019-11-04 DIAGNOSIS — U071 COVID-19: Principal | ICD-10-CM

## 2019-11-04 DIAGNOSIS — R55 Syncope and collapse: Secondary | ICD-10-CM | POA: Diagnosis present

## 2019-11-04 LAB — CBC
HCT: 34.4 % — ABNORMAL LOW (ref 36.0–46.0)
Hemoglobin: 11.1 g/dL — ABNORMAL LOW (ref 12.0–15.0)
MCH: 29.1 pg (ref 26.0–34.0)
MCHC: 32.3 g/dL (ref 30.0–36.0)
MCV: 90.3 fL (ref 80.0–100.0)
Platelets: 252 10*3/uL (ref 150–400)
RBC: 3.81 MIL/uL — ABNORMAL LOW (ref 3.87–5.11)
RDW: 15.6 % — ABNORMAL HIGH (ref 11.5–15.5)
WBC: 10.3 10*3/uL (ref 4.0–10.5)
nRBC: 0.2 % (ref 0.0–0.2)

## 2019-11-04 LAB — BASIC METABOLIC PANEL
Anion gap: 21 — ABNORMAL HIGH (ref 5–15)
BUN: 98 mg/dL — ABNORMAL HIGH (ref 8–23)
CO2: 20 mmol/L — ABNORMAL LOW (ref 22–32)
Calcium: 6 mg/dL — CL (ref 8.9–10.3)
Chloride: 93 mmol/L — ABNORMAL LOW (ref 98–111)
Creatinine, Ser: 15.24 mg/dL — ABNORMAL HIGH (ref 0.44–1.00)
GFR calc Af Amer: 3 mL/min — ABNORMAL LOW (ref >60)
GFR calc non Af Amer: 2 mL/min — ABNORMAL LOW (ref >60)
Glucose, Bld: 102 mg/dL — ABNORMAL HIGH (ref 70–99)
Potassium: 4.5 mmol/L (ref 3.5–5.1)
Sodium: 134 mmol/L — ABNORMAL LOW (ref 135–145)

## 2019-11-04 LAB — GLUCOSE, CAPILLARY
Glucose-Capillary: 125 mg/dL — ABNORMAL HIGH (ref 70–99)
Glucose-Capillary: 159 mg/dL — ABNORMAL HIGH (ref 70–99)
Glucose-Capillary: 115 mg/dL — ABNORMAL HIGH (ref 70–99)
Glucose-Capillary: 298 mg/dL — ABNORMAL HIGH (ref 70–99)

## 2019-11-04 LAB — TSH: TSH: 1.524 u[IU]/mL (ref 0.350–4.500)

## 2019-11-04 LAB — HEMOGLOBIN A1C
Hgb A1c MFr Bld: 6.3 % — ABNORMAL HIGH (ref 4.8–5.6)
Mean Plasma Glucose: 134.11 mg/dL

## 2019-11-04 MED ORDER — MIDODRINE HCL 5 MG PO TABS
5.0000 mg | ORAL_TABLET | Freq: Two times a day (BID) | ORAL | Status: DC
  Administered 2019-11-04: 20:00:00 5 mg via ORAL
  Filled 2019-11-04: qty 1

## 2019-11-04 MED ORDER — PREDNISONE 10 MG PO TABS
30.0000 mg | ORAL_TABLET | Freq: Once | ORAL | Status: AC
  Administered 2019-11-07: 09:00:00 30 mg via ORAL
  Filled 2019-11-04: qty 3

## 2019-11-04 MED ORDER — PREDNISONE 10 MG PO TABS
10.0000 mg | ORAL_TABLET | Freq: Once | ORAL | Status: AC
  Administered 2019-11-09: 08:00:00 10 mg via ORAL
  Filled 2019-11-04: qty 1

## 2019-11-04 MED ORDER — PREDNISONE 20 MG PO TABS
40.0000 mg | ORAL_TABLET | Freq: Once | ORAL | Status: AC
  Administered 2019-11-06: 09:00:00 40 mg via ORAL
  Filled 2019-11-04: qty 2

## 2019-11-04 MED ORDER — DELFLEX-LC/1.5% DEXTROSE 344 MOSM/L IP SOLN
INTRAPERITONEAL | Status: DC
  Filled 2019-11-04: qty 3000

## 2019-11-04 MED ORDER — IRBESARTAN 150 MG PO TABS
150.0000 mg | ORAL_TABLET | Freq: Every day | ORAL | Status: DC
  Administered 2019-11-04 – 2019-11-05 (×2): 150 mg via ORAL
  Filled 2019-11-04 (×2): qty 1

## 2019-11-04 MED ORDER — PREDNISONE 20 MG PO TABS
20.0000 mg | ORAL_TABLET | Freq: Once | ORAL | Status: AC
  Administered 2019-11-08: 09:00:00 20 mg via ORAL
  Filled 2019-11-04: qty 1

## 2019-11-04 MED ORDER — PREDNISONE 20 MG PO TABS
60.0000 mg | ORAL_TABLET | Freq: Once | ORAL | Status: AC
  Administered 2019-11-04: 10:00:00 60 mg via ORAL
  Filled 2019-11-04: qty 3

## 2019-11-04 MED ORDER — PREDNISONE 50 MG PO TABS
50.0000 mg | ORAL_TABLET | Freq: Once | ORAL | Status: AC
  Administered 2019-11-05: 50 mg via ORAL
  Filled 2019-11-04: qty 1

## 2019-11-04 MED ORDER — GENTAMICIN SULFATE 0.1 % EX CREA
1.0000 "application " | TOPICAL_CREAM | Freq: Every day | CUTANEOUS | Status: DC
  Administered 2019-11-05 – 2019-11-08 (×6): 1 via TOPICAL
  Filled 2019-11-04 (×2): qty 15

## 2019-11-04 MED ORDER — SENNOSIDES-DOCUSATE SODIUM 8.6-50 MG PO TABS
2.0000 | ORAL_TABLET | Freq: Two times a day (BID) | ORAL | Status: DC
  Administered 2019-11-04 – 2019-11-07 (×6): 2 via ORAL
  Filled 2019-11-04 (×9): qty 2

## 2019-11-04 NOTE — Progress Notes (Signed)
Peritoneal dialysis patient known at [facility]. Patient normally drives self and does own treatments. Since patient does home treatments there will be no change to schedule due to being COVID positive, unless there is a change in patient status. Please contact me with any dialysis placement concerns. Readmission noted.

## 2019-11-04 NOTE — Plan of Care (Signed)
  Problem: Education: Goal: Knowledge of risk factors and measures for prevention of condition will improve 11/04/2019 0107 by Clovis Riley, RN Outcome: Progressing 11/04/2019 0107 by Clovis Riley, RN Outcome: Progressing

## 2019-11-04 NOTE — Progress Notes (Signed)
[facility]  ROUNDING NOTE   Subjective:   [name] admitted to [facility] on 11/03/2019 for Orthostatic hypotension [I95.1] ESRD (end stage renal disease) ([place]) [N18.6] Generalized weakness [R53.1] Recurrent syncope [R55] COVID-19 [U07.1]  Patient was just discharged from [facility] from 2/15 to 2/19 for COVID 19 infection and she was treated with remdesirivr infusion.   She continues to be weak and tired from her infection.  She complains of constipation.   Objective:  Vital signs in last 24 hours:  Temp:  [97.3 F (36.3 C)-98.7 F (37.1 C)] 98.7 F (37.1 C) (02/22 0745) Pulse Rate:  [62-82] 82 (02/22 0745) Resp:  [12-23] 14 (02/22 0745) BP: (116-174)/(66-130) 174/74 (02/22 0745) SpO2:  [93 %-100 %] 97 % (02/22 0745) Weight:  [68.6 kg] 68.6 kg (02/21 1731)  Weight change:  Filed Weights   11/03/19 1731  Weight: 68.6 kg    Intake/Output: No intake/output data recorded.   Intake/Output this shift:  Total I/O In: 240 [P.O.:240] Out: -   Physical Exam: General: NAD,   Head: Normocephalic, atraumatic. Moist oral mucosal membranes  Eyes: Anicteric, PERRL  Neck: Supple, trachea midline  Lungs:  Clear to auscultation  Heart: Regular rate and rhythm  Abdomen:  Mild tenderness  Extremities:  no peripheral edema.  Neurologic: Nonfocal, moving all four extremities  Skin: No lesions  Access: PD catheter    Basic Metabolic Panel: Recent Labs  Lab 10/28/19 2055 10/28/19 2055 10/29/19 0629 10/29/19 0629 11/01/19 0400 11/03/19 1736 11/04/19 0617  NA 136  --  135  --  133* 135 134*  K 4.7  --  4.2  --  3.7 4.3 4.5  CL 90*  --  88*  --  92* 92* 93*  CO2 20*  --  18*  --  19* 23 20*  GLUCOSE 108*  --  58*  --  232* 68* 102*  BUN 68*  --  75*  --  76* 100* 98*  CREATININE 20.43*  --  20.72*  --  15.30* 15.21* 15.24*  CALCIUM 6.6*   < > 6.3*   < > 7.1* 6.8* 6.0*   < > = values in this interval not displayed.    Liver Function Tests: Recent Labs   Lab 10/28/19 2055 10/29/19 0629 11/03/19 1736  AST 26 24 21   ALT 16 13 17   ALKPHOS 35* 31* 44  BILITOT 0.9 0.9 0.8  PROT 6.6 6.6 6.0*  ALBUMIN 2.6* 2.7* 2.5*   No results for input(s): LIPASE, AMYLASE in the last 168 hours. No results for input(s): AMMONIA in the last 168 hours.  CBC: Recent Labs  Lab 10/28/19 2055 10/29/19 0629 11/01/19 0400 11/03/19 1736 11/04/19 0617  WBC 9.6 8.1 11.3* 11.6* 10.3  NEUTROABS 8.8*  --   --  9.1*  --   HGB 10.4* 9.9* 10.6* 11.9* 11.1*  HCT 32.1* 30.2* 33.2* 37.5 34.4*  MCV 89.9 89.3 90.7 91.7 90.3  PLT 252 267 331 285 252    Cardiac Enzymes: No results for input(s): CKTOTAL, CKMB, CKMBINDEX, TROPONINI in the last 168 hours.  BNP: Invalid input(s): POCBNP  CBG: Recent Labs  Lab 11/01/19 0344 11/01/19 0728 11/01/19 1112 11/03/19 2244 11/04/19 0745  GLUCAP 227* 191* 142* 87 125*    Microbiology: Results for orders placed or performed during the hospital encounter of 10/28/19  Respiratory Panel by RT PCR (Flu A&B, Covid) - Nasopharyngeal Swab     Status: Abnormal   Collection Time: 10/28/19  9:38 PM  Specimen: Nasopharyngeal Swab  Result Value Ref Range Status   SARS Coronavirus 2 by RT PCR POSITIVE (A) NEGATIVE Final    Comment: RESULT CALLED TO, READ BACK BY AND VERIFIED WITH: Charolotte Capuchin RN [id number] 10/28/19 HNM (NOTE) SARS-CoV-2 target nucleic acids are DETECTED. SARS-CoV-2 RNA is generally detectable in upper respiratory specimens  during the acute phase of infection. Positive results are indicative of the presence of the identified virus, but do not rule out bacterial infection or co-infection with other pathogens not detected by the test. Clinical correlation with patient history and other diagnostic information is necessary to determine patient infection status. The expected result is Negative. Fact Sheet for Patients:  PinkCheek.be Fact Sheet for Healthcare  Providers: GravelBags.it This test is not yet approved or cleared by the [place] FDA and  has been authorized for detection and/or diagnosis of SARS-CoV-2 by FDA under an Emergency Use Authorization (EUA).  This EUA will remain in effect (meaning this test can be used) f or the duration of  the COVID-19 declaration under Section 564(b)(1) of the Act, 21 U.S.C. section 360bbb-3(b)(1), unless the authorization is terminated or revoked sooner.    Influenza A by PCR NEGATIVE NEGATIVE Final   Influenza B by PCR NEGATIVE NEGATIVE Final    Comment: (NOTE) The Xpert Xpress SARS-CoV-2/FLU/RSV assay is intended as an aid in  the diagnosis of influenza from Nasopharyngeal swab specimens and  should not be used as a sole basis for treatment. Nasal washings and  aspirates are unacceptable for Xpert Xpress SARS-CoV-2/FLU/RSV  testing. Fact Sheet for Patients: PinkCheek.be Fact Sheet for Healthcare Providers: GravelBags.it This test is not yet approved or cleared by the [place] FDA and  has been authorized for detection and/or diagnosis of SARS-CoV-2 by  FDA under an Emergency Use Authorization (EUA). This EUA will remain  in effect (meaning this test can be used) for the duration of the  Covid-19 declaration under Section 564(b)(1) of the Act, 21  U.S.C. section 360bbb-3(b)(1), unless the authorization is  terminated or revoked. Performed at [facility], [address] [place], [place] [address]     Coagulation Studies: Recent Labs    11/03/19 1736  LABPROT 13.8  INR 1.1    Urinalysis: No results for input(s): COLORURINE, LABSPEC, PHURINE, GLUCOSEU, HGBUR, BILIRUBINUR, KETONESUR, PROTEINUR, UROBILINOGEN, NITRITE, LEUKOCYTESUR in the last 72 hours.  Invalid input(s): APPERANCEUR    Imaging: No results found.   Medications:    . vitamin C  500 mg Oral Daily  . aspirin  EC  81 mg Oral Daily  . atorvastatin  40 mg Oral q1800  . calcitRIOL  0.25 mcg Oral Daily  . calcium acetate  1,334 mg Oral TID  . carvedilol  25 mg Oral BID  . clopidogrel  75 mg Oral Daily  . insulin aspart  0-5 Units Subcutaneous QHS  . insulin aspart  0-6 Units Subcutaneous TID WC  . multivitamin with minerals  1 tablet Oral Daily  . predniSONE  60 mg Oral Once   And  . [START ON 11/05/2019] predniSONE  50 mg Oral Once   And  . [START ON 11/06/2019] predniSONE  40 mg Oral Once   And  . [START ON 11/07/2019] predniSONE  30 mg Oral Once   And  . [START ON 11/08/2019] predniSONE  20 mg Oral Once   And  . [START ON 11/09/2019] predniSONE  10 mg Oral Once  . senna-docusate  2 tablet Oral BID  . zinc sulfate  220 mg Oral Daily   acetaminophen **OR** acetaminophen, guaiFENesin-dextromethorphan, ondansetron **OR** ondansetron (ZOFRAN) IV  Assessment/ Plan:  [name] is a 64 y.o. black female with end stage renal disease on peritoneal dialysis, hypertension, peripheral vascular disease, hyperlipidemia, diabetes mellitus type II who has been readmitted to [facility] on 11/03/2019 for Orthostatic hypotension [I95.1] ESRD (end stage renal disease) ([place]) [N18.6] Generalized weakness [R53.1] Recurrent syncope [R55] COVID-19 [U07.1]  [facility] 66kg CCPD 9 hours 4 exchanges 2.5 liter fills.   1. End Stage Renal Disease: missed peritoneal dialysis last night. Now with constipation.  - Address constipation  - CCPD for tonight. Orders prepared.   2. Hypertension: 174/74. Home regimen of carvedilol, clonidine, furosemide, losartan, prazosin.  However with orthostatic hypotension - autonomic neuropathy.  Now holding agents due to syncope.   3. Anemia of chronic kidney disease: hemoglobin 11.1. Normocytic. EPO as outpatient.   4. Secondary Hyperparathyroidism: with hyperphosphatemia and hypocalcemia. Corrected calcium of 7.2 - Recommend restarting calcium if  GI symptoms allow.    LOS: 0 Harleen Fineberg 2/22/20219:54 AM

## 2019-11-04 NOTE — Consult Note (Signed)
Cardiology Consultation:   Patient ID: Tracy Jennings MRN: [id number]; DOB: [birth year]  Admit date: 11/03/2019 Date of Consult: 11/04/2019  Primary Care Provider: Ricardo Jericho, NP Primary Cardiologist: Iverson Alamin, Dr. Fletcher Anon Primary Electrophysiologist:  None    Patient Profile:   Tracy Jennings is a 64 y.o. female with a hx of  ESRD on peritoneal dialysis, HTN, DM2, orthostatic hypotension, and recent syncopal episode who is being seen today for the evaluation of syncope and orthostatic hypotension at the request of Dr. Manuella Ghazi.  History of Present Illness:   Tracy Jennings is a 64 yo female with PMH as above and recently tested positive for COVID-19. She is on DAPT per vascular surgery. She was admitted 10/28/19-11/01/19 for syncope thought likely 2/2 orthostatic hypotension. For her COVID-19, she completed 5 days of remdesivir. Echo was completed and showed nl EF as below. It was thought orthostasis was secondary to autonomic dysfunction. Florinef and midodrine were considered but not started because of concern for severe supine HTN.  Compression stockings were recommended. She was discharged on a prednisone taper. Yesterday, 11/03/19, she presented to [facility] again with another syncopal episode and persistent weakness, decreased appetite, and inability to care for herself at home. No recent CP, cough, SOB/DOE.She has reported mild diffuse and non-radiating abdominal pain. IN the ED, supine BP 159/85, HR 69, SpO2 100%. Cardiology consulted for recurrent syncope in the setting of COVID-19.   Heart Pathway Score:     Past Medical History:  Diagnosis Date  . Anemia 04/2018   low iron. to be started on supplements  . CKD (chronic kidney disease)    Stage IV  . COVID-19 virus detected 10/28/2019  . Diabetes mellitus without complication ([place])    type II  . ESRD (end stage renal disease) ([place])   . Heart murmur    followed as a child only  . Hyperlipidemia associated with type 2 diabetes  mellitus ([place])   . Hypertension   . Pancreatitis   . Peripheral vascular disease [facility])     Past Surgical History:  Procedure Laterality Date  . AMPUTATION TOE Left 2013   2nd toe. tip of toe (toe nail was infected)  . AV FISTULA PLACEMENT Left 05/11/2018   Procedure: ARTERIOVENOUS (AV) FISTULA CREATION;  Surgeon: Katha Cabal, MD;  Location: [facility] ORS;  Service: Vascular;  Laterality: Left;  . CATARACT EXTRACTION    . CHOLECYSTECTOMY  2014  . COLONOSCOPY    . COLONOSCOPY WITH PROPOFOL N/A 01/10/2018   Procedure: COLONOSCOPY WITH PROPOFOL;  Surgeon: Toledo, Benay Pike, MD;  Location: [facility] ENDOSCOPY;  Service: Gastroenterology;  Laterality: N/A;  . DIALYSIS/PERMA CATHETER INSERTION N/A 05/21/2018   Procedure: DIALYSIS/PERMA CATHETER INSERTION;  Surgeon: Algernon Huxley, MD;  Location: [place] CV LAB;  Service: Cardiovascular;  Laterality: N/A;  . DIALYSIS/PERMA CATHETER REMOVAL N/A 04/11/2019   Procedure: DIALYSIS/PERMA CATHETER REMOVAL;  Surgeon: Algernon Huxley, MD;  Location: [place] CV LAB;  Service: Cardiovascular;  Laterality: N/A;  . EYE SURGERY Bilateral 2018   cataract extractions  . THROMBECTOMY W/ EMBOLECTOMY  05/11/2018   Procedure: THROMBECTOMY ARTERIOVENOUS FISTULA;  Surgeon: Katha Cabal, MD;  Location: [facility] ORS;  Service: Vascular;;  . TUBAL LIGATION  1984     Home Medications:  Prior to Admission medications   Medication Sig Start Date End Date Taking? Authorizing Provider  aspirin EC 81 MG tablet Take 81 mg by mouth daily. 09/27/17   [provider]  atorvastatin (LIPITOR) 40 MG tablet  Take 40 mg by mouth daily at 6 PM.  10/05/15 07/02/18  [provider]  bumetanide (BUMEX) 1 MG tablet Take 2 mg by mouth 2 (two) times daily.  02/28/18 02/28/19  [provider]  calcitRIOL (ROCALTROL) 0.25 MCG capsule Take 0.25 mcg by mouth daily. 09/26/19   [provider]  calcium acetate (PHOSLO) 667 MG capsule Take 1,334 mg by mouth 3  (three) times daily. 08/20/19   [provider]  carvedilol (COREG) 25 MG tablet Take 25 mg by mouth 2 (two) times daily. 06/15/19   [provider]  cloNIDine (CATAPRES) 0.2 MG tablet Take 1 tablet (0.2 mg total) by mouth 2 (two) times daily. 11/01/19   Max Sane, MD  clopidogrel (PLAVIX) 75 MG tablet Take 75 mg by mouth daily.    [provider]  doxazosin (CARDURA) 4 MG tablet Take 2 mg by mouth at bedtime. 09/26/19   [provider]  fluticasone (FLONASE) 50 MCG/ACT nasal spray 2 sprays by Each Tracy route daily. 01/15/18 01/15/19  [provider]  furosemide (LASIX) 80 MG tablet Take 80 mg by mouth daily. 07/28/19   [provider]  glucose blood (PRECISION QID TEST) test strip Use once daily One Touch Ultra Blue 09/27/17   [provider]  insulin aspart (NOVOLOG) 100 UNIT/ML FlexPen Inject 10-12 Units into the skin 3 (three) times daily with meals.  12/28/15   [provider]  Insulin Glargine 300 UNIT/ML SOPN Inject 40 Units into the skin daily. In the morning.  Nelva Nay) 11/30/15   [provider]  losartan (COZAAR) 25 MG tablet Take 1 tablet (25 mg total) by mouth daily. 05/25/18 07/02/18  Saundra Shelling, MD  prazosin (MINIPRESS) 1 MG capsule Take 1 mg by mouth at bedtime. 10/23/19   [provider]  predniSONE (STERAPRED UNI-PAK 21 TAB) 10 MG (21) TBPK tablet Start 60 mg p.o. daily, taper 10 mg daily until done 11/01/19   Max Sane, MD  valsartan (DIOVAN) 160 MG tablet Take 160 mg by mouth daily. 07/28/19   [provider]  Vitamin D, Ergocalciferol, (DRISDOL) 50000 units CAPS capsule TAKE ONE CAPSULE BY MOUTH ONE TIME PER WEEK 01/06/18   [provider]  diltiazem (CARDIZEM CD) 120 MG 24 hr capsule Take 120 mg by mouth daily. 09/26/19 10/29/19  [provider]    Inpatient Medications: Scheduled Meds: . vitamin C  500 mg Oral Daily  . aspirin EC  81 mg Oral Daily  . atorvastatin  40  mg Oral q1800  . calcitRIOL  0.25 mcg Oral Daily  . calcium acetate  1,334 mg Oral TID  . carvedilol  25 mg Oral BID  . clopidogrel  75 mg Oral Daily  . insulin aspart  0-5 Units Subcutaneous QHS  . insulin aspart  0-6 Units Subcutaneous TID WC  . multivitamin with minerals  1 tablet Oral Daily  . [START ON 11/05/2019] predniSONE  50 mg Oral Once   And  . [START ON 11/06/2019] predniSONE  40 mg Oral Once   And  . [START ON 11/07/2019] predniSONE  30 mg Oral Once   And  . [START ON 11/08/2019] predniSONE  20 mg Oral Once   And  . [START ON 11/09/2019] predniSONE  10 mg Oral Once  . senna-docusate  2 tablet Oral BID  . zinc sulfate  220 mg Oral Daily   Continuous Infusions:  PRN Meds: acetaminophen **OR** acetaminophen, guaiFENesin-dextromethorphan, ondansetron **OR** ondansetron (ZOFRAN) IV  Allergies:  Allergies  Allergen Reactions  . Amlodipine Swelling    Knees down to ankles Knees down to ankles  . Hctz [Hydrochlorothiazide]     pancreatitis  . Heparin Other (See Comments)    Pt reports cardiac arrest when given heparin  . Lmw Heparin     Reports cardiac arrest when given heparin  . Other Other (See Comments)    Reports cardiac arrest when given during surgery Reports cardiac arrest when given during surgery Reports cardiac arrest when given during surgery   . Amlodipine Besylate Swelling  . Sulfa Antibiotics Rash  . Dairy Aid [Lactase]     Runny nose  . Hydralazine Nausea Only    Social History:   Social History   Socioeconomic History  . Marital status: Married    Spouse name: clarence  . Number of children: Not on file  . Years of education: Not on file  . Highest education level: Not on file  Occupational History    Comment: works nights  Tobacco Use  . Smoking status: Never Smoker  . Smokeless tobacco: Never Used  Substance and Sexual Activity  . Alcohol use: No  . Drug use: Never  . Sexual activity: Not Currently  Other Topics Concern  . Not  on file  Social History Narrative  . Not on file   Social Determinants of Health   Financial Resource Strain:   . Difficulty of Paying Living Expenses: Not on file  Food Insecurity:   . Worried About Charity fundraiser in the Last Year: Not on file  . Ran Out of Food in the Last Year: Not on file  Transportation Needs:   . Lack of Transportation (Medical): Not on file  . Lack of Transportation (Non-Medical): Not on file  Physical Activity:   . Days of Exercise per Week: Not on file  . Minutes of Exercise per Session: Not on file  Stress:   . Feeling of Stress : Not on file  Social Connections:   . Frequency of Communication with Friends and Family: Not on file  . Frequency of Social Gatherings with Friends and Family: Not on file  . Attends Religious Services: Not on file  . Active Member of Clubs or Organizations: Not on file  . Attends Archivist Meetings: Not on file  . Marital Status: Not on file  Intimate Partner Violence:   . Fear of Current or Ex-Partner: Not on file  . Emotionally Abused: Not on file  . Physically Abused: Not on file  . Sexually Abused: Not on file    Family History:    Family History  Problem Relation Age of Onset  . Stroke Mother   . Hypertension Mother   . Gout Mother   . Cancer Father   . Diabetes Sister   . Diabetes Maternal Grandmother   . Diabetes Maternal Grandfather   . Diabetes Paternal Grandmother   . Diabetes Paternal Grandfather      ROS:  Please see the history of present illness.  She denies chest pain, palpitations, dyspnea, pnd, orthopnea, n, v, edema, weight gain, or early satiety.  She reports syncope and weakness. All other ROS reviewed and negative.     Physical Exam/Data:   Vitals:   11/04/19 0515 11/04/19 0516 11/04/19 0530 11/04/19 0745  BP: (!) 133/109 (!) 150/71  (!) 174/74  Pulse: 79 78 78 82  Resp: 16 16  14   Temp:  98 F (36.7 C)  98.7 F (37.1 C)  TempSrc:  Oral  Oral  SpO2: 100%  97% 97%    Weight:      Height:        Intake/Output Summary (Last 24 hours) at 11/04/2019 1306 Last data filed at 11/04/2019 0837 Gross per 24 hour  Intake 240 ml  Output --  Net 240 ml   Last 3 Weights 11/03/2019 11/01/2019 10/30/2019  Weight (lbs) 151 lb 3.8 oz 151 lb 3.8 oz 145 lb 4.5 oz  Weight (kg) 68.6 kg 68.6 kg 65.9 kg     Body mass index is 26.79 kg/m.  Physical exam deferred to reduce contact during COVID-19 with patient with active COVID 19 diagnosis.  EKG:  The EKG was personally reviewed and demonstrates:  NSR, 90bpm, LVH, prolonged QT, baseline wander, artifact.  Relevant CV Studies: 10/29/19 Echo 1. Left ventricular ejection fraction, by estimation, is 60 to 65%. The  left ventricle has normal function. The left ventricle has no regional  wall motion abnormalities. There is moderate concentric left ventricular  hypertrophy. Left ventricular  diastolic parameters are consistent with Grade I diastolic dysfunction  (impaired relaxation).  2. Right ventricular systolic function is normal. The right ventricular  size is normal. There is normal pulmonary artery systolic pressure.  3. The mitral valve is normal in structure and function. Trivial mitral  valve regurgitation. No evidence of mitral stenosis.  4. The aortic valve is normal in structure and function. Aortic valve  regurgitation is not visualized. No aortic stenosis is present.  5. The inferior vena cava is normal in size with greater than 50%  respiratory variability, suggesting right atrial pressure of 3 mmHg.   10/29/19 B/l carotids IMPRESSION: 1. Bilateral carotid bifurcation plaque resulting in less than 50% diameter ICA stenosis. 2. Antegrade bilateral vertebral arterial flow.   Laboratory Data:  High Sensitivity Troponin:   Recent Labs  Lab 10/28/19 2055 10/28/19 2311  TROPONINIHS 44* 47*     Cardiac EnzymesNo results for input(s): TROPONINI in the last 168 hours. No results for input(s):  TROPIPOC in the last 168 hours.  Chemistry Recent Labs  Lab 11/01/19 0400 11/03/19 1736 11/04/19 0617  NA 133* 135 134*  K 3.7 4.3 4.5  CL 92* 92* 93*  CO2 19* 23 20*  GLUCOSE 232* 68* 102*  BUN 76* 100* 98*  CREATININE 15.30* 15.21* 15.24*  CALCIUM 7.1* 6.8* 6.0*  GFRNONAA 2* 2* 2*  GFRAA 3* 3* 3*  ANIONGAP 22* 20* 21*    Recent Labs  Lab 10/28/19 2055 10/29/19 0629 11/03/19 1736  PROT 6.6 6.6 6.0*  ALBUMIN 2.6* 2.7* 2.5*  AST 26 24 21   ALT 16 13 17   ALKPHOS 35* 31* 44  BILITOT 0.9 0.9 0.8   Hematology Recent Labs  Lab 11/01/19 0400 11/03/19 1736 11/04/19 0617  WBC 11.3* 11.6* 10.3  RBC 3.66* 4.09 3.81*  HGB 10.6* 11.9* 11.1*  HCT 33.2* 37.5 34.4*  MCV 90.7 91.7 90.3  MCH 29.0 29.1 29.1  MCHC 31.9 31.7 32.3  RDW 15.2 15.4 15.6*  PLT 331 285 252   BNPNo results for input(s): BNP, PROBNP in the last 168 hours.  DDimer No results for input(s): DDIMER in the last 168 hours.   Radiology/Studies:  No results found.  Assessment and Plan:   Recurrent syncope in setting of COVID-19 History of orthostatic hypotension History of supine HTN --Recent syncope with known history of both orthostatic hypotension and supine hypertension. Most recent echo as above with EF nl. Most recent carotid study above. Continue  to monitor on telemetry. Continue to recommend compression stockings and abdominal binder. Caution against Florinef and midodrine 2/2 supine hypertension. All antihypertensives are currently held per IM. Recommend review PTA medications closely given both Bumex and Lasix listed. On review of EMR, she appears to be on DAPT 2/2 vascular surgery (see 06/2018 note). Most recent BP not well controlled and would recommend restart of antihypertensives as tolerated. Recommend IV hydration and nutrition consult given poor nutrition, as well as treatment of underlying COVID-19.  Generalized weakness --Per IM. Recommend IV hydration and nutrition consult.  COVID-19  positive --Per IM.  DM2 --SSI, per IM.  HLD --Continue statin.  ESRD on peritoneal HD Anemia of chronic dz --Per nephrology. EPO as outpatient.   For questions or updates, please contact [place] Please consult [email or web address] for contact info under     Signed, Arvil Chaco, PA-C  11/04/2019 1:06 PM

## 2019-11-04 NOTE — Evaluation (Signed)
Physical Therapy Evaluation Patient Details Name: Tracy Jennings MRN: [id number] DOB: Aug 29, 1956 Today's Date: 11/04/2019   History of Present Illness  Tracy Jennings is a 72yoF who comes to [facility] s/p episode of syncope and collapse to floor. Pt (+) COVID 19. PMH: end-stage renal disease on peritoneal dialysis, HTN, type diabetes mellitus and peripheral vascular disease. Pt was here last week for same.  Clinical Impression  Pt admitted with above diagnosis. Pt currently with functional limitations due to the deficits listed below (see "PT Problem List"). Per hcart review Ca++ in 6s, but per nephrology corrected Ca++: 7.2. Of note CA++ is chronically low 2/2 hypoparathyroidism. Upon entry, pt in bed, awake and agreeable to participate. The pt is alert and oriented x4, pleasant, conversational, and generally a good historian.Pt reports significant ABD pain from constipation. Pt recently finishing with toileting at [facility] with RN. Orthostatic vitals taken, showing 158mmHg drop from supine to sitting, pt symptomatic presyncopal, does not tolerate standing >20 seconds. Pt able to perform STS and SPT Jennings hand held assist, but repeated STS transfers limited by fatigue and weakness in legs. Per attending orthostatic BP is chronically low and unlikely to be resolved prior to DC. Similar to prior admission, pt is nonambulatory due to chronic syncope, uncontrolled hypotension. Pt absolutely needs a WC to manage her mobility lest she continue to sustain falls at home as a result. Unfortunately, efforts to set patient up with a WC at prior admission last week were not fruitful. Functional mobility assessment demonstrates increased effort/time requirements, poor tolerance, and need for physical assistance, whereas the patient performed these at a higher level of independence PTA. Pt will benefit from skilled PT intervention to increase independence and safety with basic mobility in preparation for discharge to the venue  listed below.       Follow Up Recommendations Home health PT;Supervision - Intermittent;Supervision for mobility/OOB    Equipment Recommendations  Wheelchair (measurements PT);Wheelchair cushion (measurements PT)(Was set up for home delivery at prior DC, but never happened per pt.)    Recommendations for Other Services       Precautions / Restrictions Precautions Precautions: Fall Restrictions Weight Bearing Restrictions: No      Mobility  Bed Mobility Overal bed mobility: Modified Independent             General bed mobility comments: labored effort, additional time needed.  Transfers Overall transfer level: Needs assistance Equipment used: 1 person hand held assist Transfers: Sit to/from [facility] Sit to Stand: Min guard Stand pivot transfers: Min guard       General transfer comment: unable to perfrom 5xSTS when cued, progressively weaker and has arresting cough between efforts.  Ambulation/Gait Ambulation/Gait assistance: (Pt nonambulatory d/t recurrent syncope.)              Stairs            Wheelchair Mobility    Modified Rankin (Stroke Patients Only)       Balance Overall balance assessment: History of Falls;Mild deficits observed, not formally tested                                           Pertinent Vitals/Pain Pain Assessment: Faces Faces Pain Scale: Hurts even more Pain Location: Stomach Pain Descriptors / Indicators: Aching Pain Intervention(s): Limited activity within patient's tolerance;Monitored during session    Home Living Family/patient expects to be  discharged to:: Private residence Living Arrangements: Spouse/significant other Available Help at Discharge: Family Type of Home: House Home Access: Stairs to enter Entrance Stairs-Rails: Can reach both;Left;Right Entrance Stairs-Number of Steps: [place]: One level Home Equipment: [place] held shower head Additional  Comments: was supposed to have a WC or transport chair delivered at DC last week which never happened, unclear as to why.    Prior Function Level of Independence: Independent;Needs assistance   Gait / Transfers Assistance Needed: Pt nonambulatory due to chronic severe orthostatic BP and recurrent syncope.  ADL's / Homemaking Assistance Needed: some assist needed; report shusband is not especially helpful.        Hand Dominance   Dominant Hand: Right    Extremity/Trunk Assessment   Upper Extremity Assessment Upper Extremity Assessment: Generalized weakness    Lower Extremity Assessment Lower Extremity Assessment: Generalized weakness    Cervical / Trunk Assessment Cervical / Trunk Assessment: Normal  Communication   Communication: No difficulties  Cognition Arousal/Alertness: Awake/alert Behavior During Therapy: WFL for tasks assessed/performed Overall Cognitive Status: Within Functional Limits for tasks assessed                                 General Comments: A&Ox4      General Comments      Exercises     Assessment/Plan    PT Assessment Patient needs continued PT services  PT Problem List Decreased activity tolerance;Decreased balance;Decreased mobility       PT Treatment Interventions DME instruction;Gait training;Functional mobility training;Therapeutic activities;Therapeutic exercise;Patient/family education    PT Goals (Current goals can be found in the Care Plan section)  Acute Rehab PT Goals Patient Stated Goal: regain PLOF ion mobility independence PT Goal Formulation: With patient Time For Goal Achievement: 11/18/19 Potential to Achieve Goals: Good    Frequency Min 2X/week   Barriers to discharge Inaccessible home environment      Co-evaluation               AM-PAC PT "6 Clicks" Mobility  Outcome Measure Help needed turning from your back to your side while in a flat bed without using bedrails?: None Help needed  moving from lying on your back to sitting on the side of a flat bed without using bedrails?: None Help needed moving to and from a bed to a chair (including a wheelchair)?: A Little Help needed standing up from a chair using your arms (e.g., wheelchair or bedside chair)?: A Little Help needed to walk in hospital room?: A Little Help needed climbing 3-5 steps with a railing? : A Little 6 Click Score: 20    End of Session   Activity Tolerance: Treatment limited secondary to medical complications (Comment);Patient tolerated treatment well Patient left: with call bell/phone within reach;in bed Nurse Communication: Mobility status PT Visit Diagnosis: Unsteadiness on feet (R26.81);Other abnormalities of gait and mobility (R26.89);Other symptoms and signs involving the nervous system (R29.898);Dizziness and giddiness (R42)    Time: 7824-2353 PT Time Calculation (min) (ACUTE ONLY): 27 min   Charges:   PT Evaluation $PT Eval Moderate Complexity: 1 Mod          4:07 PM, 11/04/19 Etta Grandchild, PT, DPT Physical Therapist - [facility]  [phone] ([place])    Tracy Jennings 11/04/2019, 3:57 PM

## 2019-11-04 NOTE — Progress Notes (Signed)
[place] [facility] [place] NAME: Tracy Jennings    MR#:  [id number]  [place]:  1955/12/13  SUBJECTIVE:  Readmitted as she was unable to be managed at home, had another syncopal spell likely due to orthostasis REVIEW OF SYSTEMS:   Review of Systems  Constitutional: Positive for malaise/fatigue. Negative for chills and weight loss.  HENT: Negative for ear discharge, ear pain and nosebleeds.   Eyes: Negative for blurred vision, pain and discharge.  Respiratory: Negative for sputum production, shortness of breath, wheezing and stridor.   Cardiovascular: Negative for chest pain, palpitations, orthopnea and PND.  Gastrointestinal: Negative for abdominal pain, diarrhea, nausea and vomiting.  Genitourinary: Negative for frequency and urgency.  Musculoskeletal: Negative for back pain and joint pain.  Neurological: Positive for weakness. Negative for sensory change, speech change and focal weakness.  Psychiatric/Behavioral: Negative for depression and hallucinations. The patient is not nervous/anxious.    Tolerating Diet: yes Tolerating PT: no DRUG ALLERGIES:   Allergies  Allergen Reactions  . Amlodipine Swelling    Knees down to ankles Knees down to ankles  . Hctz [Hydrochlorothiazide]     pancreatitis  . Heparin Other (See Comments)    Pt reports cardiac arrest when given heparin  . Lmw Heparin     Reports cardiac arrest when given heparin  . Other Other (See Comments)    Reports cardiac arrest when given during surgery Reports cardiac arrest when given during surgery Reports cardiac arrest when given during surgery   . Amlodipine Besylate Swelling  . Sulfa Antibiotics Rash  . Dairy Aid [Lactase]     Runny nose  . Hydralazine Nausea Only    VITALS:  Blood pressure (!) 162/80, pulse 82, temperature 98.7 F (37.1 C), temperature source Oral, resp. rate 14, height 5\' 3"  (1.6 m), weight 68.6 kg, SpO2 97 %.  PHYSICAL EXAMINATION:    Physical Exam  GENERAL:  64 y.o.-year-old patient lying in the bed with no acute distress.  EYES: Pupils equal, round, reactive to light and accommodation. No scleral icterus.   HEENT: Head atraumatic, normocephalic. Oropharynx and nasopharynx clear.  NECK:  Supple, no jugular venous distention. No thyroid enlargement, no tenderness.  LUNGS: Normal breath sounds bilaterally, no wheezing, rales, rhonchi. No use of accessory muscles of respiration.  CARDIOVASCULAR: S1, S2 normal. No murmurs, rubs, or gallops.  ABDOMEN: Soft, nontender, nondistended. Bowel sounds present. No organomegaly or mass. PD cath + EXTREMITIES: No cyanosis, clubbing or edema b/l.    NEUROLOGIC: Cranial nerves II through XII are intact. No focal Motor or sensory deficits b/l.   PSYCHIATRIC:  patient is alert and oriented x 3.  SKIN: No obvious rash, lesion, or ulcer.  LABORATORY PANEL:  CBC Recent Labs  Lab 11/04/19 0617  WBC 10.3  HGB 11.1*  HCT 34.4*  PLT 252    Chemistries  Recent Labs  Lab 11/03/19 1736 11/03/19 1736 11/04/19 0617  NA 135   < > 134*  K 4.3   < > 4.5  CL 92*   < > 93*  CO2 23   < > 20*  GLUCOSE 68*   < > 102*  BUN 100*   < > 98*  CREATININE 15.21*   < > 15.24*  CALCIUM 6.8*   < > 6.0*  AST 21  --   --   ALT 17  --   --   ALKPHOS 44  --   --   BILITOT 0.8  --   --    < > =  values in this interval not displayed.   Cardiac Enzymes No results for input(s): TROPONINI in the last 168 hours. RADIOLOGY:  No results found. ASSESSMENT AND PLAN:  Delissa Silba  is a 64 y.o. female with a known history of end-stage renal disease on peritoneal dialysis, hypertension, type diabetes mellitus and peripheral vascular disease, readmitted for syncope.    Recurrent syncope   Orthostatic hypotension dysautonomic syndrome (HCC)   Supine hypertension -Recurrent syncope secondary to orthostatic hypotension likely related to diabetic autonomic neuropathy -compressive stockings, thigh high if  available -Bedrest with assistance when getting out of bed.   She has been counseled on the importance of getting up slowly having something to hold on when getting up, moving off slowly. Advised on asking for assistance when getting out of bed -Holding all antihypertensives including Coreg, losartan, furosemide, Cardura and clonidine for now.  As her blood pressure is running somewhat high, I have requested pharmacist to review all her blood pressure medicine with a consideration for orthostatic hypotension and will resume selected few as appropriate. -Continue to weigh risk of recurrent syncope versus risk of severe supine hypertension. -Might be safe to maintain a systolic blood pressure above 180, going forward, albeit with its inherent increased risk for cardiovascular and cerebrovascular events --reconsider sodium restriction in diet -I have requested nutritionist consult -Appreciate cardiology input -On last admission, CT head was negative, CT cervical spine--multilevel DJD, carotid Doppler negative for high grade stenosis, 2D echo EF of 60 to 65%.  Generalized weakness  Unable to care for self Decreased appetite in the setting of recent Covid 19 pneumonia -Likely secondary to severe orthostatic hypotension in association with recent COVID-19 infection -Physical therapy, nutritionist and transitional care input -I have requested cushioned wheelchair.  TOC team aware    COVID-19 positive on 10/28/2019 -Patient completed remdesivir treatment on 2/19/2 -Has residual weakness but no shortness of breath -Continue vitamin C, zinc and multivitamin -Prednisone taper pack resumed    Type 2 diabetes mellitus with complications -Hemoglobin A1c on 10/29/2019 was 5.9 consistent with aggressive glycemic control -In the setting of decreased appetite, will hold home basal insulin and hypoglycemics -Insulin sliding scale coverage for now    Peritoneal dialysis status [facility]) -Nephrology, dr.  Juleen China consulted for continuation of peritoneal dialysis  Procedures:none Family communication : Talked with patient directly  consults :nephrology, cardiology Discharge Disposition :home once clinically better CODE STATUS: full DVT Prophylaxis : None.  Patient refused heparin Barriers to discharge: Uncontrolled hypertension and orthostatic hypotension  TOTAL TIME TAKING CARE OF THIS PATIENT: 25 minutes.  >50% time spent on counselling and coordination of care  Note: This dictation was prepared with Dragon dictation along with smaller phrase technology. Any transcriptional errors that result from this process are unintentional.  Max Sane M.D    [facility]   CC: Primary care physician; Ricardo Jericho, NPPatient ID: BECKETT HICKMON, female   DOB: 10/08/1955, 64 y.o.   MRN: [id number]

## 2019-11-04 NOTE — Progress Notes (Signed)
Patient suffers from recurrent syncope which impairs their ability to perform daily activities like walking room to room in the home.  A walker alone will not resolve the issues with performing activities of daily living. A wheelchair will allow patient to safely perform daily activities.  The patient can self propel in the home or has a caregiver who can provide assistance.      4:11 PM, 11/04/19 Etta Grandchild, PT, DPT Physical Therapist - [facility]  [phone] [facility])

## 2019-11-04 NOTE — TOC Initial Note (Signed)
Transition of Care [facility]) - Initial/Assessment Note    Patient Details  Name: QUINCY PRISCO MRN: [id number] Date of Birth: [birth year]  Transition of Care [facility]) CM/SW Contact:    Shelbie Hutching, RN Phone Number: 11/04/2019, 1:04 PM  Clinical Narrative:                 Patient placed under observation for orthostatic hypotension, patient recently discharged from the hospital for hypertension and COVID.   Patient is still experiencing dizziness, cardiology consult is pending.  Patient referred to [place] last admission, [facility] has not had a chance to open the patient for services because of readmission but Corene Cornea with [facility] is aware of patient admission. RNCM will cont to follow and assist with discharge planning.   Expected Discharge Plan: [place] Barriers to Discharge: Continued Medical Work up   Patient Goals and CMS Choice   CMS Medicare.gov Compare Post Acute Care list provided to:: Patient Choice offered to / list presented to : Patient  Expected Discharge Plan and Services Expected Discharge Plan: [place]   Discharge Planning Services: CM Consult Post Acute Care Choice: [place], Resumption of Svcs/PTA Provider Living arrangements for the past 2 months: [place]: [place] ([place]) Date [place]: 11/04/19 Time [place]: 24 Representative spoke with at [place]: [place] Arrangements/Services Living arrangements for the past 2 months: [place] with:: Spouse Patient language and need for interpreter reviewed:: Yes        Need for Family Participation in Patient Care: Yes (Comment)(COVID orthostatic hypotension) Care giver support system in place?: Yes (comment)(husband) Current home services: Home PT, Home OT, Homehealth aide Criminal Activity/Legal Involvement Pertinent to Current  Situation/Hospitalization: No - Comment as needed  Activities of Daily Living Home Assistive Devices/Equipment: None ADL Screening (condition at time of admission) Patient's cognitive ability adequate to safely complete daily activities?: Yes Is the patient deaf or have difficulty hearing?: No Does the patient have difficulty seeing, even when wearing glasses/contacts?: No Does the patient have difficulty concentrating, remembering, or making decisions?: No Patient able to express need for assistance with ADLs?: Yes Does the patient have difficulty dressing or bathing?: No Independently performs ADLs?: Yes (appropriate for developmental age) Does the patient have difficulty walking or climbing stairs?: No Weakness of Legs: None Weakness of Arms/Hands: None  Permission Sought/Granted Permission sought to share information with : Other (comment), Family Supports, Case Manager Permission granted to share information with : Yes, Verbal Permission Granted     Permission granted to share info w AGENCY: [place]        Emotional Assessment         Alcohol / Substance Use: Not Applicable Psych Involvement: No (comment)  Admission diagnosis:  Orthostatic hypotension [I95.1] ESRD (end stage renal disease) ([place]) [N18.6] Generalized weakness [R53.1] Recurrent syncope [R55] COVID-19 [U07.1] Patient Active Problem List   Diagnosis Date Noted  . Orthostatic hypotension dysautonomic syndrome ([employer]) 11/03/2019  . Type 2 diabetes mellitus with other specified complication ([place]) 22/29/7989  . Peritoneal dialysis status ([place]) 11/03/2019  . Unable to care for self 11/03/2019  . Supine hypertension 11/03/2019  . Metabolic acidosis, increased anion gap ([place])   . COVID-19   . Generalized  weakness   . Recurrent syncope 10/28/2019  . 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 Complication of vascular access for dialysis 05/11/2018  . Respiratory arrest ([place])  05/11/2018  . Hyperlipidemia 04/12/2018  . Pancreatitis, recurrent 11/01/2017  . HTN (hypertension) 11/01/2017  . ESRD (end stage renal disease) ([place]) 11/01/2017  . Recurrent pancreatitis 11/01/2017   PCP:  Ricardo Jericho, NP Pharmacy:   [id number] [address], [place] [address] Phone: [phone] Fax: [phone]     Social Determinants of Health (SDOH) Interventions    Readmission Risk Interventions No flowsheet data found.

## 2019-11-04 NOTE — Progress Notes (Signed)
Arrived to patient room to place on PD, patient ask to go to bathroom before initiating PD, medium stool produced patient states that she doesn't feel well, vss, after toeileting PD initiated without any problems

## 2019-11-04 NOTE — Progress Notes (Signed)
Orthostatics taken during PT session. Pt unable to stand for entirety of standing assessment d/t worsening dizziness and presyncope.     11/04/19 1511  Therapy Vitals  BP (!) 162/80 (seated recovery x2 minutes)  Patient Position (if appropriate) Orthostatic Vitals  Orthostatic Lying   BP- Lying 196/88  Pulse- Lying 83  Orthostatic Sitting  BP- Sitting (!) 142/95  Pulse- Sitting 82  Orthostatic Standing at 0 minutes  BP- Standing at 0 minutes 97/72 (unable to stand for entirety, ~20sec max)  Pulse- Standing at 0 minutes 75  Oxygen Therapy  O2 Device Room Air    3:33 PM, 11/04/19 Etta Grandchild, PT, DPT Physical Therapist - [facility]  [phone] [facility])

## 2019-11-04 NOTE — TOC Progression Note (Signed)
Transition of Care [facility]) - Progression Note    Patient Details  Name: Tracy Jennings MRN: [id number] Date of Birth: [birth year]  Transition of Care [facility]) CM/SW Contact  Shelbie Hutching, RN Phone Number: 11/04/2019, 4:27 PM  Clinical Narrative:    Patient will need a wheelchair at discharge.  Brad with [employer] notified of wheelchair order and it will be delivered to the patient's home once she is ready for discharge.    Expected Discharge Plan: [place] Barriers to Discharge: Continued Medical Work up  Expected Discharge Plan and Services Expected Discharge Plan: [place]   Discharge Planning Services: CM Consult Post Acute Care Choice: Durable Medical Equipment, Home Health Living arrangements for the past 2 months: Single Family Home                 DME Arranged: Youth worker wheelchair with seat cushion DME Agency: [employer] Date DME Agency Contacted: 11/04/19 Time DME Agency Contacted: 1627 Representative spoke with at DME Agency: [place]   [place]: [place] ([place] Date [place]: 11/04/19 Time [place]: 1247 Representative spoke with at [place]: [place] Determinants of Health (SDOH) Interventions    Readmission Risk Interventions No flowsheet data found.

## 2019-11-05 DIAGNOSIS — G903 Multi-system degeneration of the autonomic nervous system: Secondary | ICD-10-CM

## 2019-11-05 DIAGNOSIS — N186 End stage renal disease: Secondary | ICD-10-CM

## 2019-11-05 DIAGNOSIS — I10 Essential (primary) hypertension: Secondary | ICD-10-CM

## 2019-11-05 LAB — BASIC METABOLIC PANEL
Anion gap: 24 — ABNORMAL HIGH (ref 5–15)
BUN: 108 mg/dL — ABNORMAL HIGH (ref 8–23)
CO2: 20 mmol/L — ABNORMAL LOW (ref 22–32)
Calcium: 6.6 mg/dL — ABNORMAL LOW (ref 8.9–10.3)
Chloride: 90 mmol/L — ABNORMAL LOW (ref 98–111)
Creatinine, Ser: 14.5 mg/dL — ABNORMAL HIGH (ref 0.44–1.00)
GFR calc Af Amer: 3 mL/min — ABNORMAL LOW (ref >60)
GFR calc non Af Amer: 2 mL/min — ABNORMAL LOW (ref >60)
Glucose, Bld: 344 mg/dL — ABNORMAL HIGH (ref 70–99)
Potassium: 4.6 mmol/L (ref 3.5–5.1)
Sodium: 134 mmol/L — ABNORMAL LOW (ref 135–145)

## 2019-11-05 LAB — CBC
HCT: 35.7 % — ABNORMAL LOW (ref 36.0–46.0)
Hemoglobin: 11.8 g/dL — ABNORMAL LOW (ref 12.0–15.0)
MCH: 29.4 pg (ref 26.0–34.0)
MCHC: 33.1 g/dL (ref 30.0–36.0)
MCV: 89 fL (ref 80.0–100.0)
Platelets: 264 10*3/uL (ref 150–400)
RBC: 4.01 MIL/uL (ref 3.87–5.11)
RDW: 15.5 % (ref 11.5–15.5)
WBC: 10.6 10*3/uL — ABNORMAL HIGH (ref 4.0–10.5)
nRBC: 0 % (ref 0.0–0.2)

## 2019-11-05 LAB — GLUCOSE, CAPILLARY
Glucose-Capillary: 306 mg/dL — ABNORMAL HIGH (ref 70–99)
Glucose-Capillary: 244 mg/dL — ABNORMAL HIGH (ref 70–99)
Glucose-Capillary: 290 mg/dL — ABNORMAL HIGH (ref 70–99)
Glucose-Capillary: 335 mg/dL — ABNORMAL HIGH (ref 70–99)

## 2019-11-05 MED ORDER — SODIUM CHLORIDE 0.9 % IV SOLN: INTRAVENOUS | Status: DC

## 2019-11-05 MED ORDER — RENA-VITE PO TABS
1.0000 | ORAL_TABLET | Freq: Every day | ORAL | Status: DC
  Administered 2019-11-05 – 2019-11-08 (×4): 1 via ORAL
  Filled 2019-11-05 (×5): qty 1

## 2019-11-05 MED ORDER — INSULIN DETEMIR 100 UNIT/ML ~~LOC~~ SOLN
7.0000 [IU] | Freq: Every day | SUBCUTANEOUS | Status: DC
  Administered 2019-11-05 – 2019-11-06 (×2): 7 [IU] via SUBCUTANEOUS
  Filled 2019-11-05 (×2): qty 1

## 2019-11-05 MED ORDER — MIDODRINE HCL 5 MG PO TABS
5.0000 mg | ORAL_TABLET | Freq: Three times a day (TID) | ORAL | Status: DC
  Filled 2019-11-05: qty 1

## 2019-11-05 MED ORDER — LINAGLIPTIN 5 MG PO TABS
5.0000 mg | ORAL_TABLET | Freq: Every day | ORAL | Status: DC
  Administered 2019-11-05 – 2019-11-07 (×3): 5 mg via ORAL
  Filled 2019-11-05 (×3): qty 1

## 2019-11-05 NOTE — Progress Notes (Addendum)
Pt BP 209/97. All scheduled BP meds given. No PRN medications available. MD notified. Awaiting orders.   Edit: MD said that pt has brittle BP that fluctuates frequently between high and low. Cardiology consulted and will see her today.

## 2019-11-05 NOTE — Progress Notes (Signed)
Progress Note  Patient Name: Tracy Jennings Date of Encounter: 11/05/2019  Primary Cardiologist: Fletcher Anon  Subjective   Patient continues to complain of orthostatic lightheadedness to the point of being afraid to stand up.  She is not lightheaded/dizzy when seated or lying down.  She feels like recent addition of prazosin for blood pressure control worsened her symptoms.  No chest pain, shortness of breath, or palpitations.  Inpatient Medications    Scheduled Meds: . vitamin C  500 mg Oral Daily  . aspirin EC  81 mg Oral Daily  . atorvastatin  40 mg Oral q1800  . calcitRIOL  0.25 mcg Oral Daily  . calcium acetate  1,334 mg Oral TID  . carvedilol  25 mg Oral BID  . clopidogrel  75 mg Oral Daily  . gentamicin cream  1 application Topical Daily  . insulin aspart  0-5 Units Subcutaneous QHS  . insulin aspart  0-6 Units Subcutaneous TID WC  . insulin detemir  7 Units Subcutaneous Daily  . irbesartan  150 mg Oral Daily  . linagliptin  5 mg Oral Daily  . midodrine  5 mg Oral BID WC  . multivitamin  1 tablet Oral QHS  . [START ON 11/06/2019] predniSONE  40 mg Oral Once   And  . [START ON 11/07/2019] predniSONE  30 mg Oral Once   And  . [START ON 11/08/2019] predniSONE  20 mg Oral Once   And  . [START ON 11/09/2019] predniSONE  10 mg Oral Once  . senna-docusate  2 tablet Oral BID  . zinc sulfate  220 mg Oral Daily   Continuous Infusions: . dialysis solution 1.5% low-MG/low-CA     PRN Meds: acetaminophen **OR** acetaminophen, guaiFENesin-dextromethorphan, ondansetron **OR** ondansetron (ZOFRAN) IV   Vital Signs    Vitals:   11/05/19 0900 11/05/19 1000 11/05/19 1001 11/05/19 1100  BP: (!) 209/97  (!) 178/78 (!) 148/71  Pulse: 85 84 83   Resp: 19 11 19 17   Temp:   97.9 F (36.6 C)   TempSrc:   Oral   SpO2: 94% 99% 95%   Weight:      Height:        Intake/Output Summary (Last 24 hours) at 11/05/2019 1434 Last data filed at 11/05/2019 0830 Gross per 24 hour  Intake 360 ml   Output --  Net 360 ml   Last 3 Weights 11/03/2019 11/01/2019 10/30/2019  Weight (lbs) 151 lb 3.8 oz 151 lb 3.8 oz 145 lb 4.5 oz  Weight (kg) 68.6 kg 68.6 kg 65.9 kg      Telemetry    Sinus rhythm - Personally Reviewed  ECG    No new tracing.  Physical Exam   GEN: No acute distress.   Neck: No JVD Cardiac: Distant heart sounds.  Regular rate and rhythm without murmurs.  Respiratory: Clear to auscultation bilaterally. GI: Soft, nontender. MS: No edema; No deformity. Neuro:  Nonfocal  Psych: Normal affect   Labs    High Sensitivity Troponin:   Recent Labs  Lab 10/28/19 2055 10/28/19 2311  TROPONINIHS 44* 47*      Chemistry Recent Labs  Lab 11/03/19 1736 11/04/19 0617 11/05/19 0415  NA 135 134* 134*  K 4.3 4.5 4.6  CL 92* 93* 90*  CO2 23 20* 20*  GLUCOSE 68* 102* 344*  BUN 100* 98* 108*  CREATININE 15.21* 15.24* 14.50*  CALCIUM 6.8* 6.0* 6.6*  PROT 6.0*  --   --   ALBUMIN 2.5*  --   --  AST 21  --   --   ALT 17  --   --   ALKPHOS 44  --   --   BILITOT 0.8  --   --   GFRNONAA 2* 2* 2*  GFRAA 3* 3* 3*  ANIONGAP 20* 21* 24*     Hematology Recent Labs  Lab 11/03/19 1736 11/04/19 0617 11/05/19 0415  WBC 11.6* 10.3 10.6*  RBC 4.09 3.81* 4.01  HGB 11.9* 11.1* 11.8*  HCT 37.5 34.4* 35.7*  MCV 91.7 90.3 89.0  MCH 29.1 29.1 29.4  MCHC 31.7 32.3 33.1  RDW 15.4 15.6* 15.5  PLT 285 252 264    BNPNo results for input(s): BNP, PROBNP in the last 168 hours.   DDimer No results for input(s): DDIMER in the last 168 hours.   Radiology    No results found.  Cardiac Studies   TTE (10/29/19): 1. Left ventricular ejection fraction, by estimation, is 60 to 65%. The  left ventricle has normal function. The left ventricle has no regional  wall motion abnormalities. There is moderate concentric left ventricular  hypertrophy. Left ventricular  diastolic parameters are consistent with Grade I diastolic dysfunction  (impaired relaxation).  2. Right  ventricular systolic function is normal. The right ventricular  size is normal. There is normal pulmonary artery systolic pressure.  3. The mitral valve is normal in structure and function. Trivial mitral  valve regurgitation. No evidence of mitral stenosis.  4. The aortic valve is normal in structure and function. Aortic valve  regurgitation is not visualized. No aortic stenosis is present.  5. The inferior vena cava is normal in size with greater than 50%  respiratory variability, suggesting right atrial pressure of 3 mmHg.  Patient Profile     64 y.o. female with history of ESRD on peritoneal dialysis, severe essential hypertension complicated by orthostatic hypotension leading to recurrent syncope, and type 2 diabetes, admitted with recurrent syncope in the setting of orthostatic hypotension and COVID-19 infection.  Assessment & Plan    Orthostatic hypotension and severe systemic hypertension: This will be challenging to control, given significant fluctuations in blood pressure.  Currently, Ms. Fass is on carvedilol, irbesartan, and midodrine.  At home, she was also taking clonidine, diltiazem, furosemide, and prazosin.  Current blood pressure appears to have stabilized some.  Recommend use of compression stockings (ideally thigh-high) as well as abdominal binder.  The patient appears euvolemic to dry and may benefit from liberalization of fluid intake, particularly in the setting of likely increased insensible losses due to COVID-19 infection.  Some recent blood pressure spikes may have been due to rebound from clonidine discontinuation.  If significantly elevated supine blood pressures persists (>200/120), it might be reasonable to restart low-dose clonidine (0.1 mg BID) with subsequent weaning.  Continue midodrine 5 mg TID with meals.  Agree with holding diltiazem and prazosin at this time.  COVID-19 infection:  Ongoing treatment per IM.  ESRD on peritoneal  dialysis:  As above, consider liberalization of fluid intake.  Ongoing management per nephrology.  For questions or updates, please contact [place] Please consult [email or web address] for contact info under [facility].     Signed, Nelva Bush, MD  11/05/2019, 2:34 PM

## 2019-11-05 NOTE — Progress Notes (Signed)
[facility]  ROUNDING NOTE   Subjective:   Sleeping this morning.  Peritoneal dialysis last night. Patient tolerated treatment well.   Complains of hunger  Objective:  Vital signs in last 24 hours:  Temp:  [98 F (36.7 C)] 98 F (36.7 C) (02/23 0000) Pulse Rate:  [85-90] 85 (02/23 0900) Resp:  [10-19] 19 (02/23 0900) BP: (124-209)/(58-97) 209/97 (02/23 0900) SpO2:  [94 %-100 %] 94 % (02/23 0900)  Weight change:  Filed Weights   11/03/19 1731  Weight: 68.6 kg    Intake/Output: I/O last 3 completed shifts: In: 480 [P.O.:480] Out: -    Intake/Output this shift:  No intake/output data recorded.  Physical Exam: General: NAD,   Head: Normocephalic, atraumatic. Moist oral mucosal membranes  Eyes: Anicteric, PERRL  Neck: Supple, trachea midline  Lungs:  Clear to auscultation  Heart: Regular rate and rhythm  Abdomen:  Mild tenderness  Extremities:  no peripheral edema.  Neurologic: Nonfocal, moving all four extremities  Skin: No lesions  Access: PD catheter    Basic Metabolic Panel: Recent Labs  Lab 11/01/19 0400 11/01/19 0400 11/03/19 1736 11/04/19 0617 11/05/19 0415  NA 133*  --  135 134* 134*  K 3.7  --  4.3 4.5 4.6  CL 92*  --  92* 93* 90*  CO2 19*  --  23 20* 20*  GLUCOSE 232*  --  68* 102* 344*  BUN 76*  --  100* 98* 108*  CREATININE 15.30*  --  15.21* 15.24* 14.50*  CALCIUM 7.1*   < > 6.8* 6.0* 6.6*   < > = values in this interval not displayed.    Liver Function Tests: Recent Labs  Lab 11/03/19 1736  AST 21  ALT 17  ALKPHOS 44  BILITOT 0.8  PROT 6.0*  ALBUMIN 2.5*   No results for input(s): LIPASE, AMYLASE in the last 168 hours. No results for input(s): AMMONIA in the last 168 hours.  CBC: Recent Labs  Lab 11/01/19 0400 11/03/19 1736 11/04/19 0617 11/05/19 0415  WBC 11.3* 11.6* 10.3 10.6*  NEUTROABS  --  9.1*  --   --   HGB 10.6* 11.9* 11.1* 11.8*  HCT 33.2* 37.5 34.4* 35.7*  MCV 90.7 91.7 90.3 89.0  PLT 331 285  252 264    Cardiac Enzymes: No results for input(s): CKTOTAL, CKMB, CKMBINDEX, TROPONINI in the last 168 hours.  BNP: Invalid input(s): POCBNP  CBG: Recent Labs  Lab 11/04/19 0745 11/04/19 1109 11/04/19 1632 11/04/19 2134 11/05/19 0743  GLUCAP 125* 159* 115* 298* 306*    Microbiology: Results for orders placed or performed during the hospital encounter of 10/28/19  Respiratory Panel by RT PCR (Flu A&B, Covid) - Nasopharyngeal Swab     Status: Abnormal   Collection Time: 10/28/19  9:38 PM   Specimen: Nasopharyngeal Swab  Result Value Ref Range Status   SARS Coronavirus 2 by RT PCR POSITIVE (A) NEGATIVE Final    Comment: RESULT CALLED TO, READ BACK BY AND VERIFIED WITH: Tracy Capuchin RN 0960 10/28/19 HNM (NOTE) SARS-CoV-2 target nucleic acids are DETECTED. SARS-CoV-2 RNA is generally detectable in upper respiratory specimens  during the acute phase of infection. Positive results are indicative of the presence of the identified virus, but do not rule out bacterial infection or co-infection with other pathogens not detected by the test. Clinical correlation with patient history and other diagnostic information is necessary to determine patient infection status. The expected result is Negative. Fact Sheet for Patients:  PinkCheek.be Fact  Sheet for Healthcare Providers: GravelBags.it This test is not yet approved or cleared by the [place] and  has been authorized for detection and/or diagnosis of SARS-CoV-2 by FDA under an Emergency Use Authorization (EUA).  This EUA will remain in effect (meaning this test can be used) f or the duration of  the COVID-19 declaration under Section 564(b)(1) of the Act, 21 U.S.C. section 360bbb-3(b)(1), unless the authorization is terminated or revoked sooner.    Influenza A by PCR NEGATIVE NEGATIVE Final   Influenza B by PCR NEGATIVE NEGATIVE Final    Comment: (NOTE) The  Xpert Xpress SARS-CoV-2/FLU/RSV assay is intended as an aid in  the diagnosis of influenza from Nasopharyngeal swab specimens and  should not be used as a sole basis for treatment. Nasal washings and  aspirates are unacceptable for Xpert Xpress SARS-CoV-2/FLU/RSV  testing. Fact Sheet for Patients: PinkCheek.be Fact Sheet for Healthcare Providers: GravelBags.it This test is not yet approved or cleared by the [place] FDA and  has been authorized for detection and/or diagnosis of SARS-CoV-2 by  FDA under an Emergency Use Authorization (EUA). This EUA will remain  in effect (meaning this test can be used) for the duration of the  Covid-19 declaration under Section 564(b)(1) of the Act, 21  U.S.C. section 360bbb-3(b)(1), unless the authorization is  terminated or revoked. Performed at [facility], [address] [place], [place] [address]     Coagulation Studies: Recent Labs    11/03/19 1736  LABPROT 13.8  INR 1.1    Urinalysis: No results for input(s): COLORURINE, LABSPEC, PHURINE, GLUCOSEU, HGBUR, BILIRUBINUR, KETONESUR, PROTEINUR, UROBILINOGEN, NITRITE, LEUKOCYTESUR in the last 72 hours.  Invalid input(s): APPERANCEUR    Imaging: No results found.   Medications:   . dialysis solution 1.5% low-MG/low-CA     . vitamin C  500 mg Oral Daily  . aspirin EC  81 mg Oral Daily  . atorvastatin  40 mg Oral q1800  . calcitRIOL  0.25 mcg Oral Daily  . calcium acetate  1,334 mg Oral TID  . carvedilol  25 mg Oral BID  . clopidogrel  75 mg Oral Daily  . gentamicin cream  1 application Topical Daily  . insulin aspart  0-5 Units Subcutaneous QHS  . insulin aspart  0-6 Units Subcutaneous TID WC  . irbesartan  150 mg Oral Daily  . midodrine  5 mg Oral BID WC  . multivitamin with minerals  1 tablet Oral Daily  . [START ON 11/06/2019] predniSONE  40 mg Oral Once   And  . [START ON 11/07/2019] predniSONE  30 mg  Oral Once   And  . [START ON 11/08/2019] predniSONE  20 mg Oral Once   And  . [START ON 11/09/2019] predniSONE  10 mg Oral Once  . senna-docusate  2 tablet Oral BID  . zinc sulfate  220 mg Oral Daily   acetaminophen **OR** acetaminophen, guaiFENesin-dextromethorphan, ondansetron **OR** ondansetron (ZOFRAN) IV  Assessment/ Plan:  [name] is a 64 y.o. black female with end stage renal disease on peritoneal dialysis, hypertension, peripheral vascular disease, hyperlipidemia, diabetes mellitus type II who has been readmitted to [facility] on 11/03/2019 for Orthostatic hypotension [I95.1] ESRD (end stage renal disease) ([place]) [N18.6] Generalized weakness [R53.1] Recurrent syncope [R55] COVID-19 [U07.1] Syncope [R55]  [facility] 66kg CCPD 9 hours 4 exchanges 2.5 liter fills.   1. End Stage Renal Disease: tolerated peritoneal dialysis last night.  - CCPD for tonight. Orders prepared.  2. Hypertension: 209/97 - elevated. Home regimen of carvedilol, clonidine, furosemide, losartan, prazosin.  However with orthostatic hypotension - autonomic neuropathy.  - restarted carvedilol and irbesartan - Would recommend holding prazosin has this can cause orthostatics.   3. Anemia of chronic kidney disease: hemoglobin 11.8. Normocytic. EPO as outpatient.   4. Secondary Hyperparathyroidism: with hyperphosphatemia and hypocalcemia. Corrected calcium of   - calcitriol - calcium acetate   LOS: 1 Ansen Sayegh 2/23/20219:52 AM

## 2019-11-05 NOTE — Progress Notes (Signed)
[place] [facility] [place] NAME: Tracy Jennings    MR#:  [id number]  DATE OF BIRTH:  Nov 27, 1955  SUBJECTIVE:  Feels some better but still weak and dizzi at times, abd binder and stockings not put on and I've encouraged her to use them REVIEW OF SYSTEMS:   Review of Systems  Constitutional: Positive for malaise/fatigue. Negative for chills and weight loss.  HENT: Negative for ear discharge, ear pain and nosebleeds.   Eyes: Negative for blurred vision, pain and discharge.  Respiratory: Negative for sputum production, shortness of breath, wheezing and stridor.   Cardiovascular: Negative for chest pain, palpitations, orthopnea and PND.  Gastrointestinal: Negative for abdominal pain, diarrhea, nausea and vomiting.  Genitourinary: Negative for frequency and urgency.  Musculoskeletal: Negative for back pain and joint pain.  Neurological: Positive for weakness. Negative for sensory change, speech change and focal weakness.  Psychiatric/Behavioral: Negative for depression and hallucinations. The patient is not nervous/anxious.    Tolerating Diet: yes Tolerating PT: no DRUG ALLERGIES:   Allergies  Allergen Reactions  . Amlodipine Swelling    Knees down to ankles Knees down to ankles  . Hctz [Hydrochlorothiazide]     pancreatitis  . Heparin Other (See Comments)    Pt reports cardiac arrest when given heparin  . Lmw Heparin     Reports cardiac arrest when given heparin  . Other Other (See Comments)    Reports cardiac arrest when given during surgery Reports cardiac arrest when given during surgery Reports cardiac arrest when given during surgery   . Amlodipine Besylate Swelling  . Sulfa Antibiotics Rash  . Dairy Aid [Lactase]     Runny nose  . Hydralazine Nausea Only    VITALS:  Blood pressure (!) 148/71, pulse 83, temperature 97.9 F (36.6 C), temperature source Oral, resp. rate 17, height 5\' 3"  (1.6 m), weight 68.6 kg, SpO2 95  %.  PHYSICAL EXAMINATION:   Physical Exam  GENERAL:  64 y.o.-year-old patient lying in the bed with no acute distress.  EYES: Pupils equal, round, reactive to light and accommodation. No scleral icterus.   HEENT: Head atraumatic, normocephalic. Oropharynx and nasopharynx clear.  NECK:  Supple, no jugular venous distention. No thyroid enlargement, no tenderness.  LUNGS: Normal breath sounds bilaterally, no wheezing, rales, rhonchi. No use of accessory muscles of respiration.  CARDIOVASCULAR: S1, S2 normal. No murmurs, rubs, or gallops.  ABDOMEN: Soft, nontender, nondistended. Bowel sounds present. No organomegaly or mass. PD cath + EXTREMITIES: No cyanosis, clubbing or edema b/l.    NEUROLOGIC: Cranial nerves II through XII are intact. No focal Motor or sensory deficits b/l.   PSYCHIATRIC:  patient is alert and oriented x 3.  SKIN: No obvious rash, lesion, or ulcer.  LABORATORY PANEL:  CBC Recent Labs  Lab 11/05/19 0415  WBC 10.6*  HGB 11.8*  HCT 35.7*  PLT 264    Chemistries  Recent Labs  Lab 11/03/19 1736 11/04/19 0617 11/05/19 0415  NA 135   < > 134*  K 4.3   < > 4.6  CL 92*   < > 90*  CO2 23   < > 20*  GLUCOSE 68*   < > 344*  BUN 100*   < > 108*  CREATININE 15.21*   < > 14.50*  CALCIUM 6.8*   < > 6.6*  AST 21  --   --   ALT 17  --   --   ALKPHOS 44  --   --  BILITOT 0.8  --   --    < > = values in this interval not displayed.   Cardiac Enzymes No results for input(s): TROPONINI in the last 168 hours. RADIOLOGY:  No results found. ASSESSMENT AND PLAN:  Tracy Jennings  is a 64 y.o. female with a known history of end-stage renal disease on peritoneal dialysis, hypertension, type diabetes mellitus and peripheral vascular disease, readmitted for syncope.    Recurrent syncope   Orthostatic hypotension dysautonomic syndrome (HCC)   Supine hypertension -Recurrent syncope secondary to orthostatic hypotension likely related to diabetic autonomic  neuropathy -compressive stockings, thigh high if available -Bedrest with assistance when getting out of bed.   She has been counseled on the importance of getting up slowly having something to hold on when getting up, moving off slowly. Advised on asking for assistance when getting out of bed -resume Coreg, avapro, hold furosemide, Cardura and clonidine for now.  Can consider restarting clonidine 0.1 mg bid if need. Will start gentle IVFs per cardio  - Midodrine 5 mg po TID trial per cardio -Continue to weigh risk of recurrent syncope versus risk of severe supine hypertension. -Might be safe to maintain a systolic blood pressure above 180, going forward, albeit with its inherent increased risk for cardiovascular and cerebrovascular events --reconsider sodium restriction in diet -I have requested nutritionist consult -Appreciate cardiology input, abd binder and high thigh compression stockings ordered - they're not in use yet when I saw patient. I've encouraged patient to use  -On last admission, CT head was negative, CT cervical spine--multilevel DJD, carotid Doppler negative for high grade stenosis, 2D echo EF of 60 to 65%.  Generalized weakness  Unable to care for self Decreased appetite in the setting of recent Covid 19 pneumonia -Likely secondary to severe orthostatic hypotension in association with recent COVID-19 infection -Physical therapy, nutritionist and transitional care input -I have requested cushioned wheelchair.  TOC team aware    COVID-19 positive on 10/28/2019 -Patient completed remdesivir treatment on 2/19/2 -Has residual weakness but no shortness of breath -Continue vitamin C, zinc and multivitamin -Prednisone taper     Type 2 diabetes mellitus with complications -Hemoglobin A1c on 10/29/2019 was 5.9 consistent with aggressive glycemic control -In the setting of decreased appetite, will hold home basal insulin and hypoglycemics -Insulin sliding scale coverage for  now    Peritoneal dialysis status [facility] -Nephrology, dr. Juleen China following for continuation of peritoneal dialysis  Procedures:none Family communication : Talked with patient directly  consults :nephrology, cardiology Discharge Disposition :home once clinically better CODE STATUS: full DVT Prophylaxis : None.  Patient refused heparin Barriers to discharge: Uncontrolled hypertension and orthostatic hypotension, still symptomatic  TOTAL TIME TAKING CARE OF THIS PATIENT: 25 minutes.  >50% time spent on counselling and coordination of care  Note: This dictation was prepared with Dragon dictation along with smaller phrase technology. Any transcriptional errors that result from this process are unintentional.  Max Sane M.D    [facility]   CC: Primary care physician; Ricardo Jericho, NPPatient ID: JYRA LAGARES, female   DOB: [birth year], 64 y.o.   MRN: [id number]

## 2019-11-05 NOTE — Progress Notes (Signed)
Pt refusing ABD binder and TED hose. Will continue to encourage.

## 2019-11-05 NOTE — Progress Notes (Signed)
Initial Nutrition Assessment  RD working remotely.  DOCUMENTATION CODES:   Not applicable  INTERVENTION:  Recommend liberalizing diet to carbohydrate modified.  RD recommended oral nutrition supplements to help meet elevated calorie/protein needs but patient does not want to drink any at this time. Encouraged adequate intake of calories and protein at meals.  Will discontinue MVI. Provide Rena-vite daily.  NUTRITION DIAGNOSIS:   Increased nutrient needs related to catabolic illness(ESRD on PD, COVID-19) as evidenced by estimated needs.  GOAL:   Patient will meet greater than or equal to 90% of their needs  MONITOR:   PO intake, Supplement acceptance, Labs, Weight trends, I & O's  REASON FOR ASSESSMENT:   Consult Assessment of nutrition requirement/status, Diet education, Other (Comment)(Recurrent syncope, orthostatic hypotension)  ASSESSMENT:   64 year old female with PMHx of HTN, DM, pancreatitis, ESRD on PD, HLD admitted with recurrent syncope, generalized weakness, who was found to be COVID-19 positive on 10/28/2019.   Spoke with patient over the phone. She reports she has had a decreased appetite and intake for 2 weeks related to COVID. She is unable to describe typical intake or how she has been eating the last few weeks but only reports she is eating less than usual. She reports her appetite has increased now and she is eating almost all of her meals. However, according to chart she is only eating 25-50% of her meals. Discussed catabolic nature of DGLOV-56 and increased calorie/protein needs. Encouraged patient to drink an ONS between meals to help meet calorie/protein needs and prevent loss of lean body mass or development of malnutrition. Patient does not want any ONS and reports they cause gas. Discussed different options available but patient does not want any ONS at this time.  Patient reports she is weight-stable. Dry weight according to Nephrology note is 66 kg.  Patient was 68.6 kg (151.24 lbs) on 11/03/2019 but unclear in chart if she had PD fill in (she receives 2.5 liter fills).  Medications reviewed and include: vitamin C 500 mg daily, Phoslo 1334 mg TID, carvedilol, Novolog 0-6 units TID, Novolog 0-5 units QHS, Levemir 7 units daily, MVI daily, senna-docusate 2 tablets BID, zinc sulfate 220 mg daily.  Labs reviewed: CBG 115-306, Sodium 134, Chloride 90, CO2 20, BUN 108, Creatinine 14.5. Potassium WNL. No Phosphorus from this admission.  Unable to determine if patient meets criteria for malnutrition at this time.  NUTRITION - FOCUSED PHYSICAL EXAM:  Unable to complete at this time.  Diet Order:   Diet Order            Diet renal/carb modified with fluid restriction Diet-HS Snack? Nothing; Fluid restriction: 1200 mL Fluid; Room service appropriate? Yes; Fluid consistency: Thin  Diet effective now             EDUCATION NEEDS:   Education needs have been addressed  Skin:  Skin Assessment: Reviewed RN Assessment  Last BM:  11/04/2019 large type 3  Height:   Ht Readings from Last 1 Encounters:  11/03/19 5\' 3"  (1.6 m)   Weight:   Wt Readings from Last 1 Encounters:  11/03/19 68.6 kg   BMI:  Body mass index is 26.79 kg/m.  Estimated Nutritional Needs:   Kcal:  2000-2200  Protein:  90-100 grams  Fluid:  per MD  Jacklynn Barnacle, MS, RD, LDN Pager number available on Amion

## 2019-11-05 NOTE — Progress Notes (Addendum)
Inpatient Diabetes Program Recommendations  AACE/ADA: New Consensus Statement on Inpatient Glycemic Control (2015)  Target Ranges:  Prepandial:   less than 140 mg/dL      Peak postprandial:   less than 180 mg/dL (1-2 hours)      Critically ill patients:  140 - 180 mg/dL   Lab Results  Component Value Date   GLUCAP 244 (H) 11/05/2019   HGBA1C 6.3 (H) 11/03/2019    Review of Glycemic Control Results for SHAKURA, COWING (MRN [id number]) as of 11/05/2019 12:46  Ref. Range 11/04/2019 07:45 11/04/2019 11:09 11/04/2019 16:32 11/04/2019 21:34 11/05/2019 07:43 11/05/2019 11:38  Glucose-Capillary Latest Ref Range: 70 - 99 mg/dL 125 (H) 159 (H) 115 (H) 298 (H) 306 (H) 244 (H)   Diabetes history: DM2 Outpatient Diabetes medications: None Current orders for Inpatient glycemic control: Novolog 0-6 units tid + 0-5 units hs  Inpatient Diabetes Program Recommendations:   While on steroids, consider: -Levemir 7 units basal qd and titrate insulin down as steroids titrated off -Trajenta 5 mg qd  Thank you, Nani Gasser. Lewis Keats, RN, MSN, CDE  Diabetes Coordinator Inpatient Glycemic Control Team Team Pager [phone] (8am-5pm) 11/05/2019 12:55 PM

## 2019-11-05 NOTE — Evaluation (Signed)
Occupational Therapy Evaluation Patient Details Name: Tracy Jennings MRN: [id number] DOB: 17-Oct-1955 Today's Date: 11/05/2019    History of Present Illness Tracy Jennings is a 63yoF who comes to [facility] s/p episode of syncope and collapse to floor. Pt (+) COVID 19. PMH: end-stage renal disease on peritoneal dialysis, HTN, type diabetes mellitus and peripheral vascular disease. Pt was here last week for same.   Clinical Impression   Pt was seen for OT evaluation this date. Prior to hospital admission, pt was requiring some assist for LB ADLs from spouse d/t dizziness. Pt lives with spouse in [facility] with 6 STE. Currently pt demonstrates impairments as described below (See OT problem list) which functionally limit her ability to perform ADL/self-care tasks. Pt currently requires MIN/MOD A for UB bathing, MAX A for LB bathing while seated d/t decreased dynamic seated balance 2/2 dizziness. Pt however, at bed level, able to don/doff socks with setup. ADL transfers unable to be assessed on OT evaluation as pt with some reported nausea having just eaten prior to session and with dizziness with any mobilization.  Pt would benefit from skilled OT to address noted impairments and functional limitations (see below for any additional details) in order to maximize safety and independence while minimizing falls risk and caregiver burden. Upon hospital discharge, recommend HHOT to maximize pt safety and return to functional independence during meaningful occupations of daily life.     Follow Up Recommendations  Home health OT;Supervision - Intermittent(supv for OOB/OOC activity)    Equipment Recommendations  3 in 1 bedside commode;Other (comment)(wheelchair)    Recommendations for Other Services       Precautions / Restrictions Precautions Precautions: Fall Restrictions Weight Bearing Restrictions: No Other Position/Activity Restrictions: watch BP, pt has been orthostatic      Mobility Bed  Mobility Overal bed mobility: Modified Independent             General bed mobility comments: labored effort, additional time needed.  Transfers                 General transfer comment: Transfers deferred on OT evaluation, but per PT note, CGA with 1 person HHA.    Balance Overall balance assessment: History of Falls;Mild deficits observed, not formally tested                                         ADL either performed or assessed with clinical judgement   ADL Overall ADL's : Needs assistance/impaired                                       General ADL Comments: Pt requires extended time for all aspects of self care. Seated EOB, pt with some c/o dizziness, but tolerable for seated tasks. Pt requires MIN/MOD A for UB bathing, MAX A for LB bathing in EOB sitting. In bed, pt able to don/doff socks with setup only. ADL transfers not assessed on OT Evaluation as pt c/o dizziness in addition to having just eaten lunch was upsetting her stomach. Pt able to perform UB grooming/hygeine tasks with setup only seated EOB from BST. Anticipate pt would need extended time and CGA-MIN assist for standing clothing mgt/peri care for toileting at this time based on clinical observation.     Vision Patient Visual Report: No change from baseline  Additional Comments: pt reports occasional blurriness or white spots when dizzy, does not report experiencing this at time of OT evaluation. States she has been dealing with this for some time. No change from baseline. Pt appears to track appropraitely.     Perception     Praxis      Pertinent Vitals/Pain Pain Assessment: No/denies pain     Hand Dominance Right   Extremity/Trunk Assessment Upper Extremity Assessment Upper Extremity Assessment: Generalized weakness   Lower Extremity Assessment Lower Extremity Assessment: Generalized weakness   Cervical / Trunk Assessment Cervical / Trunk Assessment: Normal    Communication Communication Communication: No difficulties   Cognition Arousal/Alertness: Awake/alert Behavior During Therapy: WFL for tasks assessed/performed Overall Cognitive Status: Within Functional Limits for tasks assessed                                 General Comments: A&Ox4, some impulsivity noted, for example, pt pulls off oxisensor from finger prior to eating with apparent disregard for the lead's purpose.   General Comments       Exercises Other Exercises Other Exercises: OT facilitates education re: role of OT in acute setting as well as potential d/c recommendations. Pt verbalized understanding. Other Exercises: OT facilitates education re: fall prevention considerations-call light use and notifying pt that bed alarm activated. Pt verbalized understanding.   Shoulder Instructions      Home Living Family/patient expects to be discharged to:: Private residence Living Arrangements: Spouse/significant other Available Help at Discharge: Family Type of Home: House Home Access: Stairs to enter Technical brewer of Steps: 6 Entrance Stairs-Rails: Can reach both;Left;Right Home Layout: One level     Bathroom Shower/Tub: Teacher, early years/pre: Standard     Home Equipment: Presho held shower head   Additional Comments: was supposed to have a WC or transport chair delivered at DC last week which never happened, unclear as to why.      Prior Functioning/Environment Level of Independence: Independent;Needs assistance  Gait / Transfers Assistance Needed: Pt nonambulatory due to chronic severe orthostatic BP and recurrent syncope. ADL's / Homemaking Assistance Needed: some assist needed for bathing/dressing (specifically lower body d/t dizziness with low reaching), per PT note, pt reports husband is not especially helpful   Comments: according to previous therapy evaluation, prior to pt's admission in 2019, pt was working full  time between two jobs and active including yard work.        OT Problem List: Decreased activity tolerance;Decreased knowledge of precautions;Other (comment)      OT Treatment/Interventions: Therapeutic activities;Therapeutic exercise;Self-care/ADL training;Patient/family education;Energy conservation    OT Goals(Current goals can be found in the care plan section) Acute Rehab OT Goals Patient Stated Goal: "get to where I can stand to do more for myself" OT Goal Formulation: With patient Time For Goal Achievement: 11/19/19 Potential to Achieve Goals: Good  OT Frequency: Min 1X/week   Barriers to D/C:            Co-evaluation              AM-PAC OT "6 Clicks" Daily Activity     Outcome Measure Help from another person eating meals?: None Help from another person taking care of personal grooming?: A Little Help from another person toileting, which includes using toliet, bedpan, or urinal?: A Little Help from another person bathing (including washing, rinsing, drying)?: A Lot Help from another person to put on  and taking off regular upper body clothing?: A Little Help from another person to put on and taking off regular lower body clothing?: A Little 6 Click Score: 18   End of Session Nurse Communication: Mobility status;Other (comment)(reported to RN, pt's dizziness seated during session and that pt with removal of oxisensor. RN verfied understanding.)  Activity Tolerance: Other (comment);Patient limited by lethargy Patient left: in bed;with call bell/phone within reach;with bed alarm set  OT Visit Diagnosis: History of falling (Z91.81)                Time: 1188-6773 OT Time Calculation (min): 65 min Charges:  OT General Charges $OT Visit: 1 Visit OT Evaluation $OT Eval Moderate Complexity: 1 Mod OT Treatments $Self Care/Home Management : 38-52 mins $Therapeutic Activity: 8-22 mins  Gerrianne Scale, Powell, OTR/L ascom [phone] 11/05/19, 5:45 PM

## 2019-11-06 DIAGNOSIS — I951 Orthostatic hypotension: Secondary | ICD-10-CM

## 2019-11-06 LAB — GLUCOSE, CAPILLARY
Glucose-Capillary: 305 mg/dL — ABNORMAL HIGH (ref 70–99)
Glucose-Capillary: 231 mg/dL — ABNORMAL HIGH (ref 70–99)
Glucose-Capillary: 195 mg/dL — ABNORMAL HIGH (ref 70–99)
Glucose-Capillary: 234 mg/dL — ABNORMAL HIGH (ref 70–99)

## 2019-11-06 MED ORDER — INSULIN ASPART 100 UNIT/ML ~~LOC~~ SOLN
0.0000 [IU] | Freq: Three times a day (TID) | SUBCUTANEOUS | Status: DC
  Administered 2019-11-06: 5 [IU] via SUBCUTANEOUS
  Administered 2019-11-06: 3 [IU] via SUBCUTANEOUS
  Administered 2019-11-07: 12:00:00 2 [IU] via SUBCUTANEOUS
  Administered 2019-11-07: 09:00:00 5 [IU] via SUBCUTANEOUS
  Administered 2019-11-08: 09:00:00 3 [IU] via SUBCUTANEOUS
  Administered 2019-11-08: 12:00:00 2 [IU] via SUBCUTANEOUS
  Administered 2019-11-09: 3 [IU] via SUBCUTANEOUS
  Filled 2019-11-06 (×7): qty 1

## 2019-11-06 MED ORDER — MELATONIN 5 MG PO TABS
5.0000 mg | ORAL_TABLET | Freq: Every day | ORAL | Status: DC
  Administered 2019-11-06 – 2019-11-08 (×3): 5 mg via ORAL
  Filled 2019-11-06 (×4): qty 1

## 2019-11-06 MED ORDER — INSULIN ASPART 100 UNIT/ML ~~LOC~~ SOLN
0.0000 [IU] | Freq: Every day | SUBCUTANEOUS | Status: DC
  Administered 2019-11-06: 21:00:00 2 [IU] via SUBCUTANEOUS
  Filled 2019-11-06 (×2): qty 1

## 2019-11-06 MED ORDER — NON FORMULARY: 5.0000 mg | Freq: Every day | Status: DC

## 2019-11-06 MED ORDER — IRBESARTAN 150 MG PO TABS
300.0000 mg | ORAL_TABLET | Freq: Every day | ORAL | Status: DC
  Administered 2019-11-06 – 2019-11-09 (×4): 300 mg via ORAL
  Filled 2019-11-06 (×3): qty 2

## 2019-11-06 NOTE — Progress Notes (Signed)
Inpatient Diabetes Program Recommendations  AACE/ADA: New Consensus Statement on Inpatient Glycemic Control (2015)  Target Ranges:  Prepandial:   less than 140 mg/dL      Peak postprandial:   less than 180 mg/dL (1-2 hours)      Critically ill patients:  140 - 180 mg/dL   Lab Results  Component Value Date   GLUCAP 305 (H) 11/06/2019   HGBA1C 6.3 (H) 11/03/2019    Review of Glycemic Control  Inpatient Diabetes Program Recommendations:   -Increase Novolog correction to moderate tid + hs  Thank you, Nani Gasser. Waymon Laser, RN, MSN, CDE  Diabetes Coordinator Inpatient Glycemic Control Team Team Pager [phone] (8am-5pm) 11/06/2019 9:10 AM

## 2019-11-06 NOTE — Progress Notes (Signed)
PT Cancellation Note  Patient Details Name: Tracy Jennings MRN: [id number] DOB: 01-03-1956   Cancelled Treatment:    Reason Eval/Treat Not Completed: Fatigue/lethargy limiting ability to participate(Chart reviewed, treatment attempted. Pt in bed with cover drawn overhead. C/o extreme sleeplessness, pleas for any opportunity to achieve uninterupted sleep. Author makes room dark. Will attempt again next date.)  11:19 AM, 11/06/19 Etta Grandchild, PT, DPT Physical Therapist - [facility]  [phone] ([place])   Dania Marsan C 11/06/2019, 11:19 AM

## 2019-11-06 NOTE — Progress Notes (Signed)
[facility]  ROUNDING NOTE   Subjective:   Peritoneal dialysis last night. Patient tolerated treatment well.   Patient states she is unable to get up.   Cardiology has increased irbesartan to 300mg  daily.   Holding midodrine.   Objective:  Vital signs in last 24 hours:  Temp:  [98.5 F (36.9 C)-98.7 F (37.1 C)] 98.6 F (37 C) (02/24 0800) Pulse Rate:  [86-103] 91 (02/24 1029) Resp:  [12-22] 12 (02/24 0800) BP: (118-188)/(49-100) 181/77 (02/24 1029) SpO2:  [96 %-100 %] 100 % (02/24 0800)  Weight change:  Filed Weights   11/03/19 1731  Weight: 68.6 kg    Intake/Output: I/O last 3 completed shifts: In: 748.8 [P.O.:720; I.V.:28.8] Out: -    Intake/Output this shift:  Total I/O In: 240 [P.O.:240] Out: -   Physical Exam: General: NAD,   Head: Normocephalic, atraumatic. Moist oral mucosal membranes  Eyes: Anicteric, PERRL  Neck: Supple, trachea midline  Lungs:  Clear to auscultation  Heart: Regular rate and rhythm  Abdomen:  soft  Extremities:  no peripheral edema.  Neurologic: Nonfocal, moving all four extremities  Skin: No lesions  Access: PD catheter    Basic Metabolic Panel: Recent Labs  Lab 11/01/19 0400 11/01/19 0400 11/03/19 1736 11/04/19 0617 11/05/19 0415  NA 133*  --  135 134* 134*  K 3.7  --  4.3 4.5 4.6  CL 92*  --  92* 93* 90*  CO2 19*  --  23 20* 20*  GLUCOSE 232*  --  68* 102* 344*  BUN 76*  --  100* 98* 108*  CREATININE 15.30*  --  15.21* 15.24* 14.50*  CALCIUM 7.1*   < > 6.8* 6.0* 6.6*   < > = values in this interval not displayed.    Liver Function Tests: Recent Labs  Lab 11/03/19 1736  AST 21  ALT 17  ALKPHOS 44  BILITOT 0.8  PROT 6.0*  ALBUMIN 2.5*   No results for input(s): LIPASE, AMYLASE in the last 168 hours. No results for input(s): AMMONIA in the last 168 hours.  CBC: Recent Labs  Lab 11/01/19 0400 11/03/19 1736 11/04/19 0617 11/05/19 0415  WBC 11.3* 11.6* 10.3 10.6*  NEUTROABS  --  9.1*   --   --   HGB 10.6* 11.9* 11.1* 11.8*  HCT 33.2* 37.5 34.4* 35.7*  MCV 90.7 91.7 90.3 89.0  PLT 331 285 252 264    Cardiac Enzymes: No results for input(s): CKTOTAL, CKMB, CKMBINDEX, TROPONINI in the last 168 hours.  BNP: Invalid input(s): POCBNP  CBG: Recent Labs  Lab 11/05/19 1138 11/05/19 1622 11/05/19 2036 11/06/19 0712 11/06/19 1112  GLUCAP 244* 290* 335* 305* 231*    Microbiology: Results for orders placed or performed during the hospital encounter of 10/28/19  Respiratory Panel by RT PCR (Flu A&B, Covid) - Nasopharyngeal Swab     Status: Abnormal   Collection Time: 10/28/19  9:38 PM   Specimen: Nasopharyngeal Swab  Result Value Ref Range Status   SARS Coronavirus 2 by RT PCR POSITIVE (A) NEGATIVE Final    Comment: RESULT CALLED TO, READ BACK BY AND VERIFIED WITH: Charolotte Capuchin RN [id number] 10/28/19 HNM (NOTE) SARS-CoV-2 target nucleic acids are DETECTED. SARS-CoV-2 RNA is generally detectable in upper respiratory specimens  during the acute phase of infection. Positive results are indicative of the presence of the identified virus, but do not rule out bacterial infection or co-infection with other pathogens not detected by the test. Clinical correlation with patient history and  other diagnostic information is necessary to determine patient infection status. The expected result is Negative. Fact Sheet for Patients:  PinkCheek.be Fact Sheet for Healthcare Providers: GravelBags.it This test is not yet approved or cleared by the [place] FDA and  has been authorized for detection and/or diagnosis of SARS-CoV-2 by FDA under an Emergency Use Authorization (EUA).  This EUA will remain in effect (meaning this test can be used) f or the duration of  the COVID-19 declaration under Section 564(b)(1) of the Act, 21 U.S.C. section 360bbb-3(b)(1), unless the authorization is terminated or revoked sooner.     Influenza A by PCR NEGATIVE NEGATIVE Final   Influenza B by PCR NEGATIVE NEGATIVE Final    Comment: (NOTE) The Xpert Xpress SARS-CoV-2/FLU/RSV assay is intended as an aid in  the diagnosis of influenza from Nasopharyngeal swab specimens and  should not be used as a sole basis for treatment. Nasal washings and  aspirates are unacceptable for Xpert Xpress SARS-CoV-2/FLU/RSV  testing. Fact Sheet for Patients: PinkCheek.be Fact Sheet for Healthcare Providers: GravelBags.it This test is not yet approved or cleared by the [place] FDA and  has been authorized for detection and/or diagnosis of SARS-CoV-2 by  FDA under an Emergency Use Authorization (EUA). This EUA will remain  in effect (meaning this test can be used) for the duration of the  Covid-19 declaration under Section 564(b)(1) of the Act, 21  U.S.C. section 360bbb-3(b)(1), unless the authorization is  terminated or revoked. Performed at [facility], [address] [place],  [address]     Coagulation Studies: Recent Labs    11/03/19 1736  LABPROT 13.8  INR 1.1    Urinalysis: No results for input(s): COLORURINE, LABSPEC, PHURINE, GLUCOSEU, HGBUR, BILIRUBINUR, KETONESUR, PROTEINUR, UROBILINOGEN, NITRITE, LEUKOCYTESUR in the last 72 hours.  Invalid input(s): APPERANCEUR    Imaging: No results found.   Medications:   . dialysis solution 1.5% low-MG/low-CA     . aspirin EC  81 mg Oral Daily  . atorvastatin  40 mg Oral q1800  . calcitRIOL  0.25 mcg Oral Daily  . calcium acetate  1,334 mg Oral TID  . clopidogrel  75 mg Oral Daily  . gentamicin cream  1 application Topical Daily  . insulin aspart  0-15 Units Subcutaneous TID WC  . insulin aspart  0-5 Units Subcutaneous QHS  . insulin detemir  7 Units Subcutaneous Daily  . irbesartan  300 mg Oral Daily  . linagliptin  5 mg Oral Daily  . multivitamin  1 tablet Oral QHS  . [START ON  11/07/2019] predniSONE  30 mg Oral Once   And  . [START ON 11/08/2019] predniSONE  20 mg Oral Once   And  . [START ON 11/09/2019] predniSONE  10 mg Oral Once  . senna-docusate  2 tablet Oral BID   acetaminophen **OR** acetaminophen, guaiFENesin-dextromethorphan, ondansetron **OR** ondansetron (ZOFRAN) IV  Assessment/ Plan:  Tracy Jennings is a 64 y.o. black female with end stage renal disease on peritoneal dialysis, hypertension, peripheral vascular disease, hyperlipidemia, diabetes mellitus type II who has been readmitted to [facility] on 11/03/2019 for Orthostatic hypotension [I95.1] ESRD (end stage renal disease) ([place]) [N18.6] Generalized weakness [R53.1] Recurrent syncope [R55] COVID-19 [U07.1] Syncope [R55]  [facility] 66kg CCPD 9 hours 4 exchanges 2.5 liter fills.   1. End Stage Renal Disease: tolerated peritoneal dialysis last night.  - CCPD for tonight. Orders prepared.   2. Hypertension:. Home regimen of carvedilol, clonidine, furosemide, losartan, prazosin.  However with orthostatic hypotension - autonomic neuropathy.  - restarted carvedilol and irbesartan - Would recommend holding prazosin has this can cause orthostatics.   3. Anemia of chronic kidney disease:  EPO as outpatient.   4. Secondary Hyperparathyroidism: with hyperphosphatemia and hypocalcemia.  - calcitriol - calcium acetate with meals.    LOS: 2 Nyle Limb 2/24/202111:42 AM

## 2019-11-06 NOTE — Progress Notes (Signed)
Pt refused ted hose, scd and abdominal binder. Pt refused to get up as well. Md aware

## 2019-11-06 NOTE — Progress Notes (Signed)
71.4kg after dialysis

## 2019-11-06 NOTE — Progress Notes (Signed)
Progress Note  Patient Name: Tracy Jennings Date of Encounter: 11/06/2019  Primary Cardiologist: Dr. Fletcher Anon  Subjective   Patient states feeling better in terms of her cough.  She still feels dizzy when trying to sit up on the bed or sitting the chair.  She has not walked much.  Inpatient Medications    Scheduled Meds: . vitamin C  500 mg Oral Daily  . aspirin EC  81 mg Oral Daily  . atorvastatin  40 mg Oral q1800  . calcitRIOL  0.25 mcg Oral Daily  . calcium acetate  1,334 mg Oral TID  . carvedilol  25 mg Oral BID  . clopidogrel  75 mg Oral Daily  . gentamicin cream  1 application Topical Daily  . insulin aspart  0-5 Units Subcutaneous QHS  . insulin aspart  0-6 Units Subcutaneous TID WC  . insulin detemir  7 Units Subcutaneous Daily  . irbesartan  150 mg Oral Daily  . linagliptin  5 mg Oral Daily  . midodrine  5 mg Oral TID WC  . multivitamin  1 tablet Oral QHS  . predniSONE  40 mg Oral Once   And  . [START ON 11/07/2019] predniSONE  30 mg Oral Once   And  . [START ON 11/08/2019] predniSONE  20 mg Oral Once   And  . [START ON 11/09/2019] predniSONE  10 mg Oral Once  . senna-docusate  2 tablet Oral BID  . zinc sulfate  220 mg Oral Daily   Continuous Infusions: . sodium chloride 75 mL/hr at 11/05/19 1750  . dialysis solution 1.5% low-MG/low-CA     PRN Meds: acetaminophen **OR** acetaminophen, guaiFENesin-dextromethorphan, ondansetron **OR** ondansetron (ZOFRAN) IV   Vital Signs    Vitals:   11/05/19 2046 11/05/19 2047 11/05/19 2215 11/06/19 0800  BP: 118/72 (!) 121/100 (!) 164/49 (!) 188/84  Pulse: 97 (!) 103  86  Resp: (!) 22 (!) 22 14 12   Temp:   98.7 F (37.1 C) 98.6 F (37 C)  TempSrc:   Oral   SpO2: 97% 96% 98% 100%  Weight:      Height:        Intake/Output Summary (Last 24 hours) at 11/06/2019 0843 Last data filed at 11/05/2019 1813 Gross per 24 hour  Intake 508.75 ml  Output --  Net 508.75 ml   Last 3 Weights 11/03/2019 11/01/2019 10/30/2019   Weight (lbs) 151 lb 3.8 oz 151 lb 3.8 oz 145 lb 4.5 oz  Weight (kg) 68.6 kg 68.6 kg 65.9 kg      Telemetry    Telemetry not reviewed today.  ECG    No new ECG performed  Physical Exam   GEN: No acute distress.  Eating breakfast Neck: No JVD Cardiac: RRR, no murmurs, rubs, or gallops.  Respiratory:  Poor inspiratory effort GI: Soft, nontender, non-distended  MS: No edema; No deformity. Neuro:  Nonfocal  Psych: Normal affect   Labs    High Sensitivity Troponin:   Recent Labs  Lab 10/28/19 2055 10/28/19 2311  TROPONINIHS 44* 47*      Chemistry Recent Labs  Lab 11/03/19 1736 11/04/19 0617 11/05/19 0415  NA 135 134* 134*  K 4.3 4.5 4.6  CL 92* 93* 90*  CO2 23 20* 20*  GLUCOSE 68* 102* 344*  BUN 100* 98* 108*  CREATININE 15.21* 15.24* 14.50*  CALCIUM 6.8* 6.0* 6.6*  PROT 6.0*  --   --   ALBUMIN 2.5*  --   --   AST 21  --   --  ALT 17  --   --   ALKPHOS 44  --   --   BILITOT 0.8  --   --   GFRNONAA 2* 2* 2*  GFRAA 3* 3* 3*  ANIONGAP 20* 21* 24*     Hematology Recent Labs  Lab 11/03/19 1736 11/04/19 0617 11/05/19 0415  WBC 11.6* 10.3 10.6*  RBC 4.09 3.81* 4.01  HGB 11.9* 11.1* 11.8*  HCT 37.5 34.4* 35.7*  MCV 91.7 90.3 89.0  MCH 29.1 29.1 29.4  MCHC 31.7 32.3 33.1  RDW 15.4 15.6* 15.5  PLT 285 252 264    BNPNo results for input(s): BNP, PROBNP in the last 168 hours.   DDimer No results for input(s): DDIMER in the last 168 hours.   Radiology    No results found.  Cardiac Studies   TTE (10/29/19): 1. Left ventricular ejection fraction, by estimation, is 60 to 65%. The  left ventricle has normal function. The left ventricle has no regional  wall motion abnormalities. There is moderate concentric left ventricular  hypertrophy. Left ventricular  diastolic parameters are consistent with Grade I diastolic dysfunction  (impaired relaxation).  2. Right ventricular systolic function is normal. The right ventricular  size is normal. There  is normal pulmonary artery systolic pressure.  3. The mitral valve is normal in structure and function. Trivial mitral  valve regurgitation. No evidence of mitral stenosis.  4. The aortic valve is normal in structure and function. Aortic valve  regurgitation is not visualized. No aortic stenosis is present.  5. The inferior vena cava is normal in size with greater than 50%  respiratory variability, suggesting right atrial pressure of 3 mmHg.  Patient Profile     64 y.o. female  with history of ESRD on peritoneal dialysis, severe essential hypertension complicated by orthostatic hypotension leading to recurrent syncope, and type 2 diabetes, admitted with recurrent syncope in the setting of orthostatic hypotension and COVID-19 infection.  Assessment & Plan    1.  Orthostatic hypotension -Agree with holding midodrine due to severe hypotension -Nonpharmacologic measures (avoiding sympathetic blockers, alpha adrenergic antagonist, patient education on modification of daily activities) should be implemented -Will hold Coreg due to it being both an alpha and beta antagonist.  However, Coreg it may need to be restarted if blood pressures remain elevated since patient has allergies to amlodipine, hydralazine.  Also her orthostasis prevents/ limits use of diuretics, alpha-1 antagonist. -A trial of Florinef 0.1 mg daily could also be implemented if patient still symptomatic after above therapeutic changes.  2.  Systemic hypertension -Blood pressure currently 188/84 -Increase ibesartan to 300 mg daily -Hold Coreg due to reasons stated above.  Coreg May need to be restarted if blood pressure remains elevated sayings patient's allergies limits hypertensive therapeutic options.   3.  COVID-19 pneumonia -Regimen as per primary team      Signed, Kate Sable, MD  11/06/2019, 8:43 AM

## 2019-11-06 NOTE — Progress Notes (Signed)
PROGRESS NOTE    Tracy Jennings  ZSW:[id number] DOB: December 18, 1955 DOA: 11/03/2019 PCP: Ricardo Jericho, NP      Brief Narrative:  [name] is a 64 y.o. F with ESRD on PD, severe range HTN, DM, recent COVID infection and recurrent syncope due to orthostasis who presented with syncope.  In the ER, found to have COVID.  No orthostatics done in the ER.  Cardiology and Nephrology consulted.       Assessment & Plan:   COVID, recovering Patient admitted for COVID 2/15 to 2/19.  Treated with 5 days remdesivir and steroids, minimal respiratory symptoms.  Discharged on steroid taper.  Now day 9 of isolation.  Needs isolation until Mar 8 -Completing prednisone taper Saturday  Syncope Chronic orthostatic hypotension Admitted.  Nephrology recommended avoiding Fluorinef and midodrine.  Cardiology recommended cautious trial of midodrine.   2/22 afternoon: systolic pressures dropped 196 mmHg > 97 mmHg supine to standing 3/22 morning: systolic pressure 025 supine > 119 standing > 162 standing at 3 minutes Midodrine given only once, around 21:00 on 2/22  -Hold midodrine -Hold prazosin -Defer BB and irbesartan to Cardiology -Continue thigh and abdominal binders (latter if okay'd by Nephrology) -Hold IVF   ESRD On PD -Consult nephrology, appreciate cares  Hypertension Peripheral vascular disease, secondary prevention -Continue aspirin, atorvastatin, Plavix -Hold carvedilol, prazosin, amlodipine per Cardiology -Continue irbesartan -Hold midodrine  Diabetes Glucoses elevated today but hypoglycemic 2 days ago. -Continue DPPIV inhib -Continue Levemir no change -Increase SS correction scale -Continue QHS correction  Anemia of CKD Stable relative to baseline            Disposition: The patient was admitted with severe orthostatic hypotension.   I will discharge when she is able to walk, eat, which she is not yet able to do at all.        MDM: The  below labs and imaging reports were reviewed and summarized above.  Medication management as above.   DVT prophylaxis: SCDs Code Status: FULL Family Communication: HUsband by phone    Consultants:   Cardiology  Nephrology  Procedures:     Antimicrobials:      Culture data:              Subjective: Feeling miserable.  No sleep last night.  No fever, confusion.  Very nauseated this morning, throwing up.  No abdominal pain.  Objective: Vitals:   11/05/19 2215 11/06/19 0800 11/06/19 1029 11/06/19 1600  BP: (!) 164/49 (!) 188/84 (!) 181/77 (!) 139/50  Pulse:  86 91 86  Resp: 14 12  19   Temp: 98.7 F (37.1 C) 98.6 F (37 C)  98.5 F (36.9 C)  TempSrc: Oral     SpO2: 98% 100%  99%  Weight:      Height:        Intake/Output Summary (Last 24 hours) at 11/06/2019 1655 Last data filed at 11/06/2019 1200 Gross per 24 hour  Intake 748.75 ml  Output 158 ml  Net 590.75 ml   Filed Weights   11/03/19 1731  Weight: 68.6 kg    Examination: General appearance:  adult female, alert and in moderate distress from nausea.   HEENT: Anicteric, conjunctiva pink, lids and lashes normal. No nasal deformity, discharge, epistaxis.  Lips dry, OP dry, hearing normal.   Skin: Warm and dry.  No jaundice.  No suspicious rashes or lesions. Cardiac: RRR, nl S1-S2, no murmurs appreciated.  Capillary refill is brisk.  JVPnot visible.  No LE edema.  Radial pulses 2+ and symmetric. Respiratory: Normal respiratory rate and rhythm.  CTAB without rales or wheezes. Abdomen: Abdomen soft.  Mild nonfocal TTP with voluntary guarding. No ascites, distension, hepatosplenomegaly.   MSK: No deformities or effusions. Neuro: Awake and alert.  EOMI, moves all extremities. Speech fluent.    Psych: Sensorium intact and responding to questions, attention normal. Affect normal.  Judgment and insight appear normal.    Data Reviewed: I have personally reviewed following labs and imaging  studies:  CBC: Recent Labs  Lab 11/01/19 0400 11/03/19 1736 11/04/19 0617 11/05/19 0415  WBC 11.3* 11.6* 10.3 10.6*  NEUTROABS  --  9.1*  --   --   HGB 10.6* 11.9* 11.1* 11.8*  HCT 33.2* 37.5 34.4* 35.7*  MCV 90.7 91.7 90.3 89.0  PLT 331 285 252 734   Basic Metabolic Panel: Recent Labs  Lab 11/01/19 0400 11/03/19 1736 11/04/19 0617 11/05/19 0415  NA 133* 135 134* 134*  K 3.7 4.3 4.5 4.6  CL 92* 92* 93* 90*  CO2 19* 23 20* 20*  GLUCOSE 232* 68* 102* 344*  BUN 76* 100* 98* 108*  CREATININE 15.30* 15.21* 15.24* 14.50*  CALCIUM 7.1* 6.8* 6.0* 6.6*   GFR: Estimated Creatinine Clearance: 3.8 mL/min (A) (by C-G formula based on SCr of 14.5 mg/dL (H)). Liver Function Tests: Recent Labs  Lab 11/03/19 1736  AST 21  ALT 17  ALKPHOS 44  BILITOT 0.8  PROT 6.0*  ALBUMIN 2.5*   No results for input(s): LIPASE, AMYLASE in the last 168 hours. No results for input(s): AMMONIA in the last 168 hours. Coagulation Profile: Recent Labs  Lab 11/03/19 1736  INR 1.1   Cardiac Enzymes: No results for input(s): CKTOTAL, CKMB, CKMBINDEX, TROPONINI in the last 168 hours. BNP (last 3 results) No results for input(s): PROBNP in the last 8760 hours. HbA1C: Recent Labs    11/03/19 1736  HGBA1C 6.3*   CBG: Recent Labs  Lab 11/05/19 1622 11/05/19 2036 11/06/19 0712 11/06/19 1112 11/06/19 1620  GLUCAP 290* 335* 305* 231* 195*   Lipid Profile: No results for input(s): CHOL, HDL, LDLCALC, TRIG, CHOLHDL, LDLDIRECT in the last 72 hours. Thyroid Function Tests: Recent Labs    11/04/19 0617  TSH 1.524   Anemia Panel: No results for input(s): VITAMINB12, FOLATE, FERRITIN, TIBC, IRON, RETICCTPCT in the last 72 hours. Urine analysis:    Component Value Date/Time   COLORURINE YELLOW (A) 10/31/2017 1959   APPEARANCEUR HAZY (A) 10/31/2017 1959   APPEARANCEUR Hazy 12/09/2013 1046   LABSPEC 1.017 10/31/2017 1959   LABSPEC 1.031 12/09/2013 1046   PHURINE 6.0 10/31/2017 1959    GLUCOSEU >=500 (A) 10/31/2017 1959   GLUCOSEU >=500 12/09/2013 1046   HGBUR NEGATIVE 10/31/2017 [place] Gu[place] NEGATIVE 10/31/2017 1959   BILIRUBINUR Negative 12/09/2013 1046   [place] NEGATIVE 10/31/2017 1959   PROTEINUR >=300 (A) 10/31/2017 1959   NITRITE NEGATIVE 10/31/2017 1959   LEUKOCYTESUR NEGATIVE 10/31/2017 1959   LEUKOCYTESUR Negative 12/09/2013 1046   Sepsis Labs: @LABRCNTIP (procalcitonin:4,lacticacidven:4)  ) Recent Results (from the past 240 hour(s))  Respiratory Panel by RT PCR (Flu A&B, Covid) - Nasopharyngeal Swab     Status: Abnormal   Collection Time: 10/28/19  9:38 PM   Specimen: Nasopharyngeal Swab  Result Value Ref Range Status   SARS Coronavirus 2 by RT PCR POSITIVE (A) NEGATIVE Final    Comment: RESULT CALLED TO, READ BACK BY AND VERIFIED WITH: Charolotte Capuchin RN 1937 10/28/19 HNM (NOTE) SARS-CoV-2 target nucleic acids are DETECTED.  SARS-CoV-2 RNA is generally detectable in upper respiratory specimens  during the acute phase of infection. Positive results are indicative of the presence of the identified virus, but do not rule out bacterial infection or co-infection with other pathogens not detected by the test. Clinical correlation with patient history and other diagnostic information is necessary to determine patient infection status. The expected result is Negative. Fact Sheet for Patients:  PinkCheek.be Fact Sheet for Healthcare Providers: GravelBags.it This test is not yet approved or cleared by the [place] FDA and  has been authorized for detection and/or diagnosis of SARS-CoV-2 by FDA under an Emergency Use Authorization (EUA).  This EUA will remain in effect (meaning this test can be used) f or the duration of  the COVID-19 declaration under Section 564(b)(1) of the Act, 21 U.S.C. section 360bbb-3(b)(1), unless the authorization is terminated or revoked sooner.    Influenza A  by PCR NEGATIVE NEGATIVE Final   Influenza B by PCR NEGATIVE NEGATIVE Final    Comment: (NOTE) The Xpert Xpress SARS-CoV-2/FLU/RSV assay is intended as an aid in  the diagnosis of influenza from Nasopharyngeal swab specimens and  should not be used as a sole basis for treatment. Nasal washings and  aspirates are unacceptable for Xpert Xpress SARS-CoV-2/FLU/RSV  testing. Fact Sheet for Patients: PinkCheek.be Fact Sheet for Healthcare Providers: GravelBags.it This test is not yet approved or cleared by the [place] FDA and  has been authorized for detection and/or diagnosis of SARS-CoV-2 by  FDA under an Emergency Use Authorization (EUA). This EUA will remain  in effect (meaning this test can be used) for the duration of the  Covid-19 declaration under Section 564(b)(1) of the Act, 21  U.S.C. section 360bbb-3(b)(1), unless the authorization is  terminated or revoked. Performed at [facility], [address], [place], [place] [address]          Radiology Studies: No results found.      Scheduled Meds: . calcitRIOL  0.25 mcg Oral Daily  . calcium acetate  1,334 mg Oral TID  . clopidogrel  75 mg Oral Daily  . gentamicin cream  1 application Topical Daily  . insulin aspart  0-15 Units Subcutaneous TID WC  . insulin aspart  0-5 Units Subcutaneous QHS  . insulin detemir  7 Units Subcutaneous Daily  . irbesartan  300 mg Oral Daily  . linagliptin  5 mg Oral Daily  . multivitamin  1 tablet Oral QHS  . [START ON 11/07/2019] predniSONE  30 mg Oral Once   And  . [START ON 11/08/2019] predniSONE  20 mg Oral Once   And  . [START ON 11/09/2019] predniSONE  10 mg Oral Once  . senna-docusate  2 tablet Oral BID   Continuous Infusions: . dialysis solution 1.5% low-MG/low-CA       LOS: 2 days    Time spent: 25 minutes    Edwin Dada, MD [facility] 11/06/2019, 4:55 PM     Please page  though [place] or Epic secure chat:  For [employer], Adult nurse

## 2019-11-06 NOTE — Progress Notes (Signed)
Post CCPD Tx  Pt tolerated   11/06/19 1000  Peritoneal Catheter Right lower abdomen  Placement Date/Time: (c) (c)   Catheter Location: Right lower abdomen  Site Assessment Clean;Dry;Intact  Drainage Description None  Catheter status Accessed  Dressing Gauze/Drain sponge  Dressing Status Clean;Dry;Intact  Completion  Effluent Appearance Clear;Yellow  Treatment Status Complete  Fluid Balance - CCPD  Total Output for Exchanges (mL) 158 ml  Procedure Comments  Tolerated treatment well? Yes  Peritoneal Dialysis Comments  (Net UF 123mL, pt self disconnected )  Education / Care Plan  Dialysis Education Provided Yes  Documented Education in [place] well, net UF 172mL

## 2019-11-07 DIAGNOSIS — E1143 Type 2 diabetes mellitus with diabetic autonomic (poly)neuropathy: Secondary | ICD-10-CM

## 2019-11-07 DIAGNOSIS — R531 Weakness: Secondary | ICD-10-CM

## 2019-11-07 DIAGNOSIS — I739 Peripheral vascular disease, unspecified: Secondary | ICD-10-CM

## 2019-11-07 DIAGNOSIS — I951 Orthostatic hypotension: Secondary | ICD-10-CM

## 2019-11-07 LAB — CBC
HCT: 31.8 % — ABNORMAL LOW (ref 36.0–46.0)
Hemoglobin: 10.1 g/dL — ABNORMAL LOW (ref 12.0–15.0)
MCH: 29 pg (ref 26.0–34.0)
MCHC: 31.8 g/dL (ref 30.0–36.0)
MCV: 91.4 fL (ref 80.0–100.0)
Platelets: 253 10*3/uL (ref 150–400)
RBC: 3.48 MIL/uL — ABNORMAL LOW (ref 3.87–5.11)
RDW: 15.8 % — ABNORMAL HIGH (ref 11.5–15.5)
WBC: 16.5 10*3/uL — ABNORMAL HIGH (ref 4.0–10.5)
nRBC: 0 % (ref 0.0–0.2)

## 2019-11-07 LAB — GLUCOSE, CAPILLARY
Glucose-Capillary: 223 mg/dL — ABNORMAL HIGH (ref 70–99)
Glucose-Capillary: 147 mg/dL — ABNORMAL HIGH (ref 70–99)
Glucose-Capillary: 83 mg/dL (ref 70–99)
Glucose-Capillary: 140 mg/dL — ABNORMAL HIGH (ref 70–99)

## 2019-11-07 LAB — RENAL FUNCTION PANEL
Albumin: 2.1 g/dL — ABNORMAL LOW (ref 3.5–5.0)
Anion gap: 17 — ABNORMAL HIGH (ref 5–15)
BUN: 106 mg/dL — ABNORMAL HIGH (ref 8–23)
CO2: 21 mmol/L — ABNORMAL LOW (ref 22–32)
Calcium: 6.8 mg/dL — ABNORMAL LOW (ref 8.9–10.3)
Chloride: 94 mmol/L — ABNORMAL LOW (ref 98–111)
Creatinine, Ser: 12.97 mg/dL — ABNORMAL HIGH (ref 0.44–1.00)
GFR calc Af Amer: 3 mL/min — ABNORMAL LOW (ref >60)
GFR calc non Af Amer: 3 mL/min — ABNORMAL LOW (ref >60)
Glucose, Bld: 275 mg/dL — ABNORMAL HIGH (ref 70–99)
Phosphorus: 6 mg/dL — ABNORMAL HIGH (ref 2.5–4.6)
Potassium: 3.9 mmol/L (ref 3.5–5.1)
Sodium: 132 mmol/L — ABNORMAL LOW (ref 135–145)

## 2019-11-07 MED ORDER — INSULIN DETEMIR 100 UNIT/ML ~~LOC~~ SOLN
10.0000 [IU] | Freq: Every day | SUBCUTANEOUS | Status: DC
  Administered 2019-11-07 – 2019-11-09 (×3): 10 [IU] via SUBCUTANEOUS
  Filled 2019-11-07 (×3): qty 1

## 2019-11-07 MED ORDER — CARVEDILOL 3.125 MG PO TABS
6.2500 mg | ORAL_TABLET | Freq: Two times a day (BID) | ORAL | Status: DC
  Administered 2019-11-07 – 2019-11-09 (×4): 6.25 mg via ORAL
  Filled 2019-11-07 (×3): qty 2

## 2019-11-07 MED ORDER — CARVEDILOL 12.5 MG PO TABS
12.5000 mg | ORAL_TABLET | Freq: Two times a day (BID) | ORAL | Status: DC
  Administered 2019-11-07: 12:00:00 12.5 mg via ORAL
  Filled 2019-11-07 (×2): qty 1

## 2019-11-07 MED ORDER — INSULIN ASPART 100 UNIT/ML ~~LOC~~ SOLN
3.0000 [IU] | Freq: Three times a day (TID) | SUBCUTANEOUS | Status: DC
  Administered 2019-11-07 – 2019-11-09 (×7): 3 [IU] via SUBCUTANEOUS
  Filled 2019-11-07 (×5): qty 1

## 2019-11-07 NOTE — Progress Notes (Addendum)
Progress Note  Patient Name: Tracy Jennings Date of Encounter: 11/07/2019  Primary Cardiologist: New to [place] this morning, Overall no complaints Peritoneal dialysis yesterday Denies shortness of breath, chest pain Review of blood pressures 132/77 supine last night otherwise trending in the 150 range yesterday -On irbesartan Other medications appear to have been held including clonidine, prazosin, Coreg and Lasix, diltiazem  --Carvedilol was held yesterday and today prior to that was 25 twice daily --Irbesartan increased from 150 up to 300 mg starting yesterday and today --Midodrine last given February 22 late p.m. dose Inpatient Medications    Scheduled Meds: . calcitRIOL  0.25 mcg Oral Daily  . calcium acetate  1,334 mg Oral TID  . clopidogrel  75 mg Oral Daily  . gentamicin cream  1 application Topical Daily  . insulin aspart  0-15 Units Subcutaneous TID WC  . insulin aspart  0-5 Units Subcutaneous QHS  . insulin aspart  3 Units Subcutaneous TID WC  . insulin detemir  10 Units Subcutaneous Daily  . irbesartan  300 mg Oral Daily  . linagliptin  5 mg Oral Daily  . Melatonin  5 mg Oral QHS  . multivitamin  1 tablet Oral QHS  . [START ON 11/08/2019] predniSONE  20 mg Oral Once   And  . [START ON 11/09/2019] predniSONE  10 mg Oral Once  . senna-docusate  2 tablet Oral BID   Continuous Infusions: . dialysis solution 1.5% low-MG/low-CA     PRN Meds: acetaminophen **OR** acetaminophen, guaiFENesin-dextromethorphan, ondansetron **OR** ondansetron (ZOFRAN) IV   Vital Signs    Vitals:   11/06/19 0800 11/06/19 1029 11/06/19 1600 11/06/19 2328  BP: (!) 188/84 (!) 181/77 (!) 139/50 132/77  Pulse: 86 91 86 93  Resp: 12  19 18   Temp: 98.6 F (37 C)  98.5 F (36.9 C) 98 F (36.7 C)  TempSrc:    Oral  SpO2: 100%  99% 99%  Weight:      Height:        Intake/Output Summary (Last 24 hours) at 11/07/2019 1041 Last data filed at 11/07/2019  0844 Gross per 24 hour  Intake 600 ml  Output 261 ml  Net 339 ml   Last 3 Weights 11/03/2019 11/01/2019 10/30/2019  Weight (lbs) 151 lb 3.8 oz 151 lb 3.8 oz 145 lb 4.5 oz  Weight (kg) 68.6 kg 68.6 kg 65.9 kg      Telemetry    Normal sinus rhythm- Personally Reviewed  ECG     - Personally Reviewed  Physical Exam   GEN: No acute distress.  Sleepy Neck: No JVD Cardiac: RRR, no murmurs, rubs, or gallops.  Respiratory: Clear to auscultation bilaterally. GI: Soft, nontender, non-distended  MS: No edema; No deformity. Neuro:  Nonfocal  Psych: Normal affect   Labs    High Sensitivity Troponin:   Recent Labs  Lab 10/28/19 2055 10/28/19 2311  TROPONINIHS 44* 47*      Chemistry Recent Labs  Lab 11/03/19 1736 11/03/19 1736 11/04/19 0617 11/05/19 0415 11/07/19 0422  NA 135   < > 134* 134* 132*  K 4.3   < > 4.5 4.6 3.9  CL 92*   < > 93* 90* 94*  CO2 23   < > 20* 20* 21*  GLUCOSE 68*   < > 102* 344* 275*  BUN 100*   < > 98* 108* 106*  CREATININE 15.21*   < > 15.24* 14.50* 12.97*  CALCIUM 6.8*   < >  6.0* 6.6* 6.8*  PROT 6.0*  --   --   --   --   ALBUMIN 2.5*  --   --   --  2.1*  AST 21  --   --   --   --   ALT 17  --   --   --   --   ALKPHOS 44  --   --   --   --   BILITOT 0.8  --   --   --   --   GFRNONAA 2*   < > 2* 2* 3*  GFRAA 3*   < > 3* 3* 3*  ANIONGAP 20*   < > 21* 24* 17*   < > = values in this interval not displayed.     Hematology Recent Labs  Lab 11/04/19 0617 11/05/19 0415 11/07/19 0422  WBC 10.3 10.6* 16.5*  RBC 3.81* 4.01 3.48*  HGB 11.1* 11.8* 10.1*  HCT 34.4* 35.7* 31.8*  MCV 90.3 89.0 91.4  MCH 29.1 29.4 29.0  MCHC 32.3 33.1 31.8  RDW 15.6* 15.5 15.8*  PLT 252 264 253    BNPNo results for input(s): BNP, PROBNP in the last 168 hours.   DDimer No results for input(s): DDIMER in the last 168 hours.   Radiology    No results found.  Cardiac Studies   Echo 1. Left ventricular ejection fraction, by estimation, is 60 to 65%. The   left ventricle has normal function. The left ventricle has no regional  wall motion abnormalities. There is moderate concentric left ventricular  hypertrophy. Left ventricular  diastolic parameters are consistent with Grade I diastolic dysfunction  (impaired relaxation).  2. Right ventricular systolic function is normal. The right ventricular  size is normal. There is normal pulmonary artery systolic pressure.  3. The mitral valve is normal in structure and function. Trivial mitral  valve regurgitation. No evidence of mitral stenosis.  4. The aortic valve is normal in structure and function. Aortic valve  regurgitation is not visualized. No aortic stenosis is present.  5. The inferior vena cava is normal in size with greater than 50%  respiratory variability, suggesting right atrial pressure of 3 mmHg.   Patient Profile     64 y.o. female with history of ESRD on peritoneal dialysis, severe essential hypertension complicated by orthostatic hypotension leading to recurrent syncope, and type 2 diabetes, admitted with recurrent syncope in the setting of orthostatic hypotension and COVID-19 infection.  Assessment & Plan    COVID-19 infection Recovering, treated with remdesivir and steroids, denies respiratory issues Prior admission October 28, 2019 at that time with general malaise, diarrhea and orthostasis Orthostasis and weakness has persisted with readmission February 21  Orthostasis Likely exacerbated by COVID-19 infection Most of her medications have been held as detailed above Medications held including clonidine, prazosin, Coreg and Lasix, diltiazem Not on midodrine Tolerating irbesartan 300 daily --Peritoneal dialysis last night, tolerated well --Recommend we restart carvedilol today though at lower dose 12.5 twice daily given  pressures 160 up to 180s overnight heart rate 90-100 --- No recent orthostatic numbers in the chart Would recommend orthostatics every 6 -8 hours   Stage renal disease on hemodialysis Peritoneal dialysis yesterday, nephrology following  PAD On aspirin, Lipitor Carotid ultrasound February 2021 Less than 50% disease bilaterally, vertebral vessels antegrade flow  Syncope Secondary to orthostasis in the setting of COVID-19 infection, diarrhea, prerenal state/anorexia, polypharmacy --Blood pressures improving  Extensive review of chart, numerous medications used for malignant hypertension started  and stopped through the past 2 hospital admissions, details reviewed  Total encounter time more than 35 minutes  Greater than 50% was spent in counseling and coordination of care with the patient   For questions or updates, please contact [place] Please consult [email or web address] for contact info under        Signed, Ida Rogue, MD  11/07/2019, 10:41 AM

## 2019-11-07 NOTE — Progress Notes (Signed)
Pt vomiting. Offerent antiemetic. Pt refused.

## 2019-11-07 NOTE — Progress Notes (Signed)
PROGRESS NOTE    Tracy Jennings  DJT:[id number] DOB: [birth year] DOA: 11/03/2019 PCP: Tracy Jericho, NP      Brief Narrative:  Tracy Jennings is a 64 y.o. F with ESRD on PD, severe range HTN, DM, recent COVID infection and recurrent syncope due to orthostasis who presented with syncope.  In the ER, found to have COVID.  No orthostatics done in the ER.  Cardiology and Nephrology consulted.       Assessment & Plan:   COVID, recovering Patient admitted for COVID 2/15 to 2/19.  Treated with 5 days remdesivir and steroids, minimal respiratory symptoms.  Discharged on steroid taper.  Now day 9 of isolation.  Needs isolation until Mar 8 No new respiratory symptoms -Continue prednisone taper, finished on Saturday    Syncope Chronic orthostatic hypotension Admitted.  Nephrology recommended avoiding Fluorinef and midodrine.  Cardiology recommended cautious trial of midodrine.   2/22 afternoon: systolic pressures dropped 196 mmHg > 97 mmHg supine to standing 3/00 morning: systolic pressure 923 supine > 119 standing > 162 standing at 3 minutes Midodrine given only once, around 21:00 on 2/22  Symptoms severe yesterday, today unchanged.  Vomits and is severely dizzy and weak with standing.  No vertigo. -Hold midodrine -Hold prazosin -Defer BB and irbesartan to Cardiology -Continue thigh and abdominal binders (latter if okay'd by Nephrology) -Hold IVF   ESRD On PD -Consult nephrology, appreciate cares  Hypertension Peripheral vascular disease, secondary prevention -Continue aspirin, atorvastatin, Plavix -Defer irbesartan, carvedilol, prazosin, amlodipine per Cardiology/Nephrology -Hold midodrine  Diabetes Glucoses elevated -Continue DPPIV inhib -Continue Levemir, increase dose -Continue SS correction scale -Continue QHS correction -Start mealtime insulin, hold if <50% meal eaten  Anemia of CKD Stable relative to baseline             Disposition: The patient was admitted with syncope, inability to stand.  She has essentially unchanged symptoms, vomits anytime she stands, and is unable to walk more than a few feet.    I will discharge when she is able to walk, eat, which she is not yet able to do at all.        MDM: The below labs and imaging reports reviewed and summarized above.  Medication management as above.     DVT prophylaxis: SCDs Code Status: FULL Family Communication: husband by phone    Consultants:   Cardiology  Nephrology  Procedures:     Antimicrobials:      Culture data:              Subjective: Uncomfortable.   No fever, confusion, abdominal pain.  She still very nauseated.  Objective: Vitals:   11/06/19 2328 11/07/19 1051 11/07/19 1109 11/07/19 1410  BP: 132/77 (!) 181/99 (!) 162/83 138/67  Pulse: 93 100  81  Resp: 18     Temp: 98 F (36.7 C)   97.9 F (36.6 C)  TempSrc: Oral     SpO2: 99% 100%  97%  Weight:    69.9 kg  Height:        Intake/Output Summary (Last 24 hours) at 11/07/2019 1612 Last data filed at 11/07/2019 0844 Gross per 24 hour  Intake 360 ml  Output 261 ml  Net 99 ml   Filed Weights   11/03/19 1731 11/07/19 1410  Weight: 68.6 kg 69.9 kg    Examination: General appearance:  adult female, alert and in moderate distress from nausea, lying on her side.   HEENT: Anicteric, conjunctiva pink, lids and lashes normal. No  nasal deformity, discharge, epistaxis.  Lips dry, teeth normal. OP tacky dry, no oral lesions.   Skin: Warm and dry.  No suspicious rashes or lesions. Cardiac: RRR, no murmurs appreciated.  No LE edema.    Respiratory: Normal respiratory rate and rhythm.  CTAB without rales or wheezes. Abdomen: Abdomen soft.  No tenderness palpation or guarding. No ascites, distension, hepatosplenomegaly.   MSK: No deformities or effusions of the large joints of the upper or lower extremities bilaterally. Neuro: Awake and alert.  Naming is grossly intact, and the patient's recall, recent and remote, as well as general fund of knowledge seem within normal limits.  Muscle tone diminished, without fasciculations.  Moves all extremities equally and with normal coordination but severe generalized weakness.  Speech fluent.    Psych: Sensorium intact and responding to questions, attention normal. Affect blunted.  Judgment and insight appear normal.      Data Reviewed: I have personally reviewed following labs and imaging studies:  CBC: Recent Labs  Lab 11/01/19 0400 11/03/19 1736 11/04/19 0617 11/05/19 0415 11/07/19 0422  WBC 11.3* 11.6* 10.3 10.6* 16.5*  NEUTROABS  --  9.1*  --   --   --   HGB 10.6* 11.9* 11.1* 11.8* 10.1*  HCT 33.2* 37.5 34.4* 35.7* 31.8*  MCV 90.7 91.7 90.3 89.0 91.4  PLT 331 285 252 264 161   Basic Metabolic Panel: Recent Labs  Lab 11/01/19 0400 11/03/19 1736 11/04/19 0617 11/05/19 0415 11/07/19 0422  NA 133* 135 134* 134* 132*  K 3.7 4.3 4.5 4.6 3.9  CL 92* 92* 93* 90* 94*  CO2 19* 23 20* 20* 21*  GLUCOSE 232* 68* 102* 344* 275*  BUN 76* 100* 98* 108* 106*  CREATININE 15.30* 15.21* 15.24* 14.50* 12.97*  CALCIUM 7.1* 6.8* 6.0* 6.6* 6.8*  PHOS  --   --   --   --  6.0*   GFR: Estimated Creatinine Clearance: 4.3 mL/min (A) (by C-G formula based on SCr of 12.97 mg/dL (H)). Liver Function Tests: Recent Labs  Lab 11/03/19 1736 11/07/19 0422  AST 21  --   ALT 17  --   ALKPHOS 44  --   BILITOT 0.8  --   PROT 6.0*  --   ALBUMIN 2.5* 2.1*   No results for input(s): LIPASE, AMYLASE in the last 168 hours. No results for input(s): AMMONIA in the last 168 hours. Coagulation Profile: Recent Labs  Lab 11/03/19 1736  INR 1.1   Cardiac Enzymes: No results for input(s): CKTOTAL, CKMB, CKMBINDEX, TROPONINI in the last 168 hours. BNP (last 3 results) No results for input(s): PROBNP in the last 8760 hours. HbA1C: No results for input(s): HGBA1C in the last 72 hours. CBG: Recent  Labs  Lab 11/06/19 1112 11/06/19 1620 11/06/19 2115 11/07/19 0753 11/07/19 1140  GLUCAP 231* 195* 234* 223* 147*   Lipid Profile: No results for input(s): CHOL, HDL, LDLCALC, TRIG, CHOLHDL, LDLDIRECT in the last 72 hours. Thyroid Function Tests: No results for input(s): TSH, T4TOTAL, FREET4, T3FREE, THYROIDAB in the last 72 hours. Anemia Panel: No results for input(s): VITAMINB12, FOLATE, FERRITIN, TIBC, IRON, RETICCTPCT in the last 72 hours. Urine analysis:    Component Value Date/Time   COLORURINE YELLOW (A) 10/31/2017 1959   APPEARANCEUR HAZY (A) 10/31/2017 1959   APPEARANCEUR Hazy 12/09/2013 1046   LABSPEC 1.017 10/31/2017 1959   LABSPEC 1.031 12/09/2013 1046   PHURINE 6.0 10/31/2017 1959   GLUCOSEU >=500 (A) 10/31/2017 1959   GLUCOSEU >=500 12/09/2013 1046  HGBUR NEGATIVE 10/31/2017 [place] NEGATIVE 10/31/2017 1959   BILIRUBINUR Negative 12/09/2013 1046   [place] 10/31/2017 1959   PROTEINUR >=300 (A) 10/31/2017 1959   NITRITE NEGATIVE 10/31/2017 1959   LEUKOCYTESUR NEGATIVE 10/31/2017 1959   LEUKOCYTESUR Negative 12/09/2013 1046   Sepsis Labs: @LABRCNTIP (procalcitonin:4,lacticacidven:4)  ) Recent Results (from the past 240 hour(s))  Respiratory Panel by RT PCR (Flu A&B, Covid) - Nasopharyngeal Swab     Status: Abnormal   Collection Time: 10/28/19  9:38 PM   Specimen: Nasopharyngeal Swab  Result Value Ref Range Status   SARS Coronavirus 2 by RT PCR POSITIVE (A) NEGATIVE Final    Comment: RESULT CALLED TO, READ BACK BY AND VERIFIED WITH: Charolotte Capuchin RN [id number] 10/28/19 HNM (NOTE) SARS-CoV-2 target nucleic acids are DETECTED. SARS-CoV-2 RNA is generally detectable in upper respiratory specimens  during the acute phase of infection. Positive results are indicative of the presence of the identified virus, but do not rule out bacterial infection or co-infection with other pathogens not detected by the test. Clinical correlation with patient  history and other diagnostic information is necessary to determine patient infection status. The expected result is Negative. Fact Sheet for Patients:  PinkCheek.be Fact Sheet for Healthcare Providers: GravelBags.it This test is not yet approved or cleared by the [place] FDA and  has been authorized for detection and/or diagnosis of SARS-CoV-2 by FDA under an Emergency Use Authorization (EUA).  This EUA will remain in effect (meaning this test can be used) f or the duration of  the COVID-19 declaration under Section 564(b)(1) of the Act, 21 U.S.C. section 360bbb-3(b)(1), unless the authorization is terminated or revoked sooner.    Influenza A by PCR NEGATIVE NEGATIVE Final   Influenza B by PCR NEGATIVE NEGATIVE Final    Comment: (NOTE) The Xpert Xpress SARS-CoV-2/FLU/RSV assay is intended as an aid in  the diagnosis of influenza from Nasopharyngeal swab specimens and  should not be used as a sole basis for treatment. Nasal washings and  aspirates are unacceptable for Xpert Xpress SARS-CoV-2/FLU/RSV  testing. Fact Sheet for Patients: PinkCheek.be Fact Sheet for Healthcare Providers: GravelBags.it This test is not yet approved or cleared by the [place] FDA and  has been authorized for detection and/or diagnosis of SARS-CoV-2 by  FDA under an Emergency Use Authorization (EUA). This EUA will remain  in effect (meaning this test can be used) for the duration of the  Covid-19 declaration under Section 564(b)(1) of the Act, 21  U.S.C. section 360bbb-3(b)(1), unless the authorization is  terminated or revoked. Performed at [facility], [address] [place], [place] [address]          Radiology Studies: No results found.      Scheduled Meds: . carvedilol  6.25 mg Oral BID WC  . clopidogrel  75 mg Oral Daily  . gentamicin cream  1  application Topical Daily  . insulin aspart  0-15 Units Subcutaneous TID WC  . insulin aspart  0-5 Units Subcutaneous QHS  . insulin aspart  3 Units Subcutaneous TID WC  . insulin detemir  10 Units Subcutaneous Daily  . irbesartan  300 mg Oral Daily  . Melatonin  5 mg Oral QHS  . multivitamin  1 tablet Oral QHS  . [START ON 11/08/2019] predniSONE  20 mg Oral Once   And  . [START ON 11/09/2019] predniSONE  10 mg Oral Once  . senna-docusate  2 tablet Oral BID   Continuous Infusions: . dialysis solution  1.5% low-MG/low-CA       LOS: 3 days    Time spent: 25 minutes    Edwin Dada, MD [facility] 11/07/2019, 4:12 PM     Please page though [place] or Epic secure chat:  For [employer], Adult nurse

## 2019-11-07 NOTE — Progress Notes (Signed)
[facility]  ROUNDING NOTE   Subjective:   Peritoneal dialysis last night. Patient tolerated treatment well.   Supine blood pressures are better this morning.   Objective:  Vital signs in last 24 hours:  Temp:  [98 F (36.7 C)-98.5 F (36.9 C)] 98 F (36.7 C) (02/24 2328) Pulse Rate:  [86-93] 93 (02/24 2328) Resp:  [18-19] 18 (02/24 2328) BP: (132-181)/(50-77) 132/77 (02/24 2328) SpO2:  [99 %] 99 % (02/24 2328)  Weight change:  Filed Weights   11/03/19 1731  Weight: 68.6 kg    Intake/Output: I/O last 3 completed shifts: In: 840 [P.O.:840] Out: 158 [Other:158]   Intake/Output this shift:  Total I/O In: -  Out: 261 [Other:261]  Physical Exam: General: NAD,   Head: Normocephalic, atraumatic. Moist oral mucosal membranes  Eyes: Anicteric, PERRL  Neck: Supple, trachea midline  Lungs:  Clear to auscultation  Heart: Regular rate and rhythm  Abdomen:  soft  Extremities:  no peripheral edema.  Neurologic: Nonfocal, moving all four extremities  Skin: No lesions  Access: PD catheter    Basic Metabolic Panel: Recent Labs  Lab 11/01/19 0400 11/01/19 0400 11/03/19 1736 11/03/19 1736 11/04/19 0617 11/05/19 0415 11/07/19 0422  NA 133*  --  135  --  134* 134* 132*  K 3.7  --  4.3  --  4.5 4.6 3.9  CL 92*  --  92*  --  93* 90* 94*  CO2 19*  --  23  --  20* 20* 21*  GLUCOSE 232*  --  68*  --  102* 344* 275*  BUN 76*  --  100*  --  98* 108* 106*  CREATININE 15.30*  --  15.21*  --  15.24* 14.50* 12.97*  CALCIUM 7.1*   < > 6.8*   < > 6.0* 6.6* 6.8*  PHOS  --   --   --   --   --   --  6.0*   < > = values in this interval not displayed.    Liver Function Tests: Recent Labs  Lab 11/03/19 1736 11/07/19 0422  AST 21  --   ALT 17  --   ALKPHOS 44  --   BILITOT 0.8  --   PROT 6.0*  --   ALBUMIN 2.5* 2.1*   No results for input(s): LIPASE, AMYLASE in the last 168 hours. No results for input(s): AMMONIA in the last 168 hours.  CBC: Recent Labs   Lab 11/01/19 0400 11/03/19 1736 11/04/19 0617 11/05/19 0415 11/07/19 0422  WBC 11.3* 11.6* 10.3 10.6* 16.5*  NEUTROABS  --  9.1*  --   --   --   HGB 10.6* 11.9* 11.1* 11.8* 10.1*  HCT 33.2* 37.5 34.4* 35.7* 31.8*  MCV 90.7 91.7 90.3 89.0 91.4  PLT 331 285 252 264 253    Cardiac Enzymes: No results for input(s): CKTOTAL, CKMB, CKMBINDEX, TROPONINI in the last 168 hours.  BNP: Invalid input(s): POCBNP  CBG: Recent Labs  Lab 11/06/19 0712 11/06/19 1112 11/06/19 1620 11/06/19 2115 11/07/19 0753  GLUCAP 305* 231* 195* 234* 223*    Microbiology: Results for orders placed or performed during the hospital encounter of 10/28/19  Respiratory Panel by RT PCR (Flu A&B, Covid) - Nasopharyngeal Swab     Status: Abnormal   Collection Time: 10/28/19  9:38 PM   Specimen: Nasopharyngeal Swab  Result Value Ref Range Status   SARS Coronavirus 2 by RT PCR POSITIVE (A) NEGATIVE Final    Comment: RESULT CALLED  TO, READ BACK BY AND VERIFIED WITH: Charolotte Capuchin RN [id number] 10/28/19 HNM (NOTE) SARS-CoV-2 target nucleic acids are DETECTED. SARS-CoV-2 RNA is generally detectable in upper respiratory specimens  during the acute phase of infection. Positive results are indicative of the presence of the identified virus, but do not rule out bacterial infection or co-infection with other pathogens not detected by the test. Clinical correlation with patient history and other diagnostic information is necessary to determine patient infection status. The expected result is Negative. Fact Sheet for Patients:  PinkCheek.be Fact Sheet for Healthcare Providers: GravelBags.it This test is not yet approved or cleared by the [place] FDA and  has been authorized for detection and/or diagnosis of SARS-CoV-2 by FDA under an Emergency Use Authorization (EUA).  This EUA will remain in effect (meaning this test can be used) f or the duration of   the COVID-19 declaration under Section 564(b)(1) of the Act, 21 U.S.C. section 360bbb-3(b)(1), unless the authorization is terminated or revoked sooner.    Influenza A by PCR NEGATIVE NEGATIVE Final   Influenza B by PCR NEGATIVE NEGATIVE Final    Comment: (NOTE) The Xpert Xpress SARS-CoV-2/FLU/RSV assay is intended as an aid in  the diagnosis of influenza from Nasopharyngeal swab specimens and  should not be used as a sole basis for treatment. Nasal washings and  aspirates are unacceptable for Xpert Xpress SARS-CoV-2/FLU/RSV  testing. Fact Sheet for Patients: PinkCheek.be Fact Sheet for Healthcare Providers: GravelBags.it This test is not yet approved or cleared by the [place] FDA and  has been authorized for detection and/or diagnosis of SARS-CoV-2 by  FDA under an Emergency Use Authorization (EUA). This EUA will remain  in effect (meaning this test can be used) for the duration of the  Covid-19 declaration under Section 564(b)(1) of the Act, 21  U.S.C. section 360bbb-3(b)(1), unless the authorization is  terminated or revoked. Performed at [facility], [address] [place], [place] [address]     Coagulation Studies: No results for input(s): LABPROT, INR in the last 72 hours.  Urinalysis: No results for input(s): COLORURINE, LABSPEC, PHURINE, GLUCOSEU, HGBUR, BILIRUBINUR, KETONESUR, PROTEINUR, UROBILINOGEN, NITRITE, LEUKOCYTESUR in the last 72 hours.  Invalid input(s): APPERANCEUR    Imaging: No results found.   Medications:   . dialysis solution 1.5% low-MG/low-CA     . calcitRIOL  0.25 mcg Oral Daily  . calcium acetate  1,334 mg Oral TID  . clopidogrel  75 mg Oral Daily  . gentamicin cream  1 application Topical Daily  . insulin aspart  0-15 Units Subcutaneous TID WC  . insulin aspart  0-5 Units Subcutaneous QHS  . insulin aspart  3 Units Subcutaneous TID WC  . insulin detemir  10  Units Subcutaneous Daily  . irbesartan  300 mg Oral Daily  . linagliptin  5 mg Oral Daily  . Melatonin  5 mg Oral QHS  . multivitamin  1 tablet Oral QHS  . [START ON 11/08/2019] predniSONE  20 mg Oral Once   And  . [START ON 11/09/2019] predniSONE  10 mg Oral Once  . senna-docusate  2 tablet Oral BID   acetaminophen **OR** acetaminophen, guaiFENesin-dextromethorphan, ondansetron **OR** ondansetron (ZOFRAN) IV  Assessment/ Plan:  [name] is a 64 y.o. black female with end stage renal disease on peritoneal dialysis, hypertension, peripheral vascular disease, hyperlipidemia, diabetes mellitus type II who has been readmitted to [facility] on 11/03/2019 for Orthostatic hypotension [I95.1] ESRD (end stage renal disease) ([place]) [N18.6] Generalized weakness [A45.1]  Recurrent syncope [R55] COVID-19 [U07.1] Syncope [R55]  [facility] 66kg CCPD 9 hours 4 exchanges 2.5 liter fills.   1. End Stage Renal Disease: tolerated peritoneal dialysis last night.  - CCPD for tonight. Orders prepared. Dextrose 1.5%.   2. Hypertension:. Home regimen of carvedilol, clonidine, furosemide, losartan, prazosin.  However with orthostatic hypotension secondary to autonomic neuropathy.  - restarted carvedilol and irbesartan - Would recommend holding prazosin has this can cause orthostatics.   3. Anemia of chronic kidney disease: hemoglobin 10.1 EPO as outpatient.   4. Secondary Hyperparathyroidism: with hyperphosphatemia and hypocalcemia.  - calcitriol - calcium acetate with meals.    LOS: 3 Sekai [place] 2/25/202110:14 AM

## 2019-11-07 NOTE — Progress Notes (Signed)
Assumed care of pt. Report received from [place], [place].

## 2019-11-07 NOTE — Progress Notes (Signed)
Pt felt dizzy and was falling when walking from bsc. Pt held and placed on ground. Pt does not report any injuries or pain. Pt assisted to the bed with other help. VS obtained. BP 138/67, HR 81, O2 sat 97, RR 18. Pt currently resting in bed. MD notified.

## 2019-11-07 NOTE — Progress Notes (Signed)
Pd complrted, patient disconnected self, reports catheter secured. Exit site care will be performed at set up.

## 2019-11-07 NOTE — Progress Notes (Addendum)
PT Cancellation Note  Patient Details Name: Tracy Jennings MRN: [id number] DOB: 28-Dec-1955   Cancelled Treatment:    Reason Eval/Treat Not Completed: Medical issues which prohibited therapy(Chart reviewed, treatment attempted. Pt asleep upon entry, easily made awake. Pt and author converse for a few minutes, then pt has cough without relent, eventually moving to sitting quickly for emesis. Author offers improvised vessel of greatest proximity. Pt remains at EOB nauseated for 5 minutes, several emetic episodes, small in volume, transparent and colorless. RN made aware. PT session deferred at this time. Will reattempt at later date/time.   RUE BP taken twice seated 181/17mm Hg, RN made awake.   12:12 PM, 11/07/19 Etta Grandchild, PT, DPT Physical Therapist - [facility]  [phone] ([place])    Simpson C 11/07/2019, 12:11 PM

## 2019-11-08 DIAGNOSIS — E1169 Type 2 diabetes mellitus with other specified complication: Secondary | ICD-10-CM

## 2019-11-08 DIAGNOSIS — Z789 Other specified health status: Secondary | ICD-10-CM

## 2019-11-08 LAB — CBC
HCT: 31.7 % — ABNORMAL LOW (ref 36.0–46.0)
Hemoglobin: 10.1 g/dL — ABNORMAL LOW (ref 12.0–15.0)
MCH: 29.1 pg (ref 26.0–34.0)
MCHC: 31.9 g/dL (ref 30.0–36.0)
MCV: 91.4 fL (ref 80.0–100.0)
Platelets: 227 10*3/uL (ref 150–400)
RBC: 3.47 MIL/uL — ABNORMAL LOW (ref 3.87–5.11)
RDW: 15.4 % (ref 11.5–15.5)
WBC: 12.3 10*3/uL — ABNORMAL HIGH (ref 4.0–10.5)
nRBC: 0 % (ref 0.0–0.2)

## 2019-11-08 LAB — GLUCOSE, CAPILLARY
Glucose-Capillary: 190 mg/dL — ABNORMAL HIGH (ref 70–99)
Glucose-Capillary: 131 mg/dL — ABNORMAL HIGH (ref 70–99)
Glucose-Capillary: 96 mg/dL (ref 70–99)
Glucose-Capillary: 138 mg/dL — ABNORMAL HIGH (ref 70–99)

## 2019-11-08 LAB — RENAL FUNCTION PANEL
Albumin: 2.1 g/dL — ABNORMAL LOW (ref 3.5–5.0)
Anion gap: 19 — ABNORMAL HIGH (ref 5–15)
BUN: 114 mg/dL — ABNORMAL HIGH (ref 8–23)
CO2: 21 mmol/L — ABNORMAL LOW (ref 22–32)
Calcium: 6.8 mg/dL — ABNORMAL LOW (ref 8.9–10.3)
Chloride: 93 mmol/L — ABNORMAL LOW (ref 98–111)
Creatinine, Ser: 13.84 mg/dL — ABNORMAL HIGH (ref 0.44–1.00)
GFR calc Af Amer: 3 mL/min — ABNORMAL LOW (ref >60)
GFR calc non Af Amer: 3 mL/min — ABNORMAL LOW (ref >60)
Glucose, Bld: 210 mg/dL — ABNORMAL HIGH (ref 70–99)
Phosphorus: 7.1 mg/dL — ABNORMAL HIGH (ref 2.5–4.6)
Potassium: 3.8 mmol/L (ref 3.5–5.1)
Sodium: 133 mmol/L — ABNORMAL LOW (ref 135–145)

## 2019-11-08 NOTE — Progress Notes (Signed)
Progress Note  Patient Name: Tracy Jennings Date of Encounter: 11/08/2019  Primary Cardiologist: New to [place] this morning, Overall no complaints Peritoneal dialysis yesterday On further discussion concerning any symptoms, reports that she does not feel well overall but is nonspecific -Spent most of the time in bed, have not seen her out of bed in a chair Inpatient Medications    Scheduled Meds: . carvedilol  6.25 mg Oral BID WC  . clopidogrel  75 mg Oral Daily  . gentamicin cream  1 application Topical Daily  . insulin aspart  0-15 Units Subcutaneous TID WC  . insulin aspart  0-5 Units Subcutaneous QHS  . insulin aspart  3 Units Subcutaneous TID WC  . insulin detemir  10 Units Subcutaneous Daily  . irbesartan  300 mg Oral Daily  . Melatonin  5 mg Oral QHS  . multivitamin  1 tablet Oral QHS  . [START ON 11/09/2019] predniSONE  10 mg Oral Once  . senna-docusate  2 tablet Oral BID   Continuous Infusions: . dialysis solution 1.5% low-MG/low-CA     PRN Meds: acetaminophen **OR** acetaminophen, guaiFENesin-dextromethorphan, ondansetron **OR** ondansetron (ZOFRAN) IV   Vital Signs    Vitals:   11/07/19 1410 11/08/19 0105 11/08/19 0500 11/08/19 0735  BP: 138/67 (!) 133/56  (!) 149/78  Pulse: 81 88  86  Resp:  18  18  Temp: 97.9 F (36.6 C) 98.2 F (36.8 C)  97.8 F (36.6 C)  TempSrc:  Oral  Oral  SpO2: 97% 97%  100%  Weight: 69.9 kg  75.7 kg   Height:       No intake or output data in the 24 hours ending 11/08/19 1552 Last 3 Weights 11/08/2019 11/07/2019 11/03/2019  Weight (lbs) 166 lb 14.2 oz 154 lb 1.6 oz 151 lb 3.8 oz  Weight (kg) 75.7 kg 69.9 kg 68.6 kg      Telemetry    Normal sinus rhythm- Personally Reviewed  ECG     - Personally Reviewed  Physical Exam   Constitutional:  oriented to person, place, and time. No distress.  HENT:  Head: Grossly normal Eyes:  no discharge. No scleral icterus.  Neck: No JVD, no  carotid bruits  Cardiovascular: Regular rate and rhythm, no murmurs appreciated Pulmonary/Chest: Clear to auscultation bilaterally, no wheezes or rails Abdominal: Soft.  no distension.  no tenderness.  Musculoskeletal: Normal range of motion Neurological:  normal muscle tone. Coordination normal. No atrophy Skin: Skin warm and dry Psychiatric: normal affect, pleasant  Labs    High Sensitivity Troponin:   Recent Labs  Lab 10/28/19 2055 10/28/19 2311  TROPONINIHS 44* 47*      Chemistry Recent Labs  Lab 11/03/19 1736 11/04/19 0617 11/05/19 0415 11/07/19 0422 11/08/19 0334  NA 135   < > 134* 132* 133*  K 4.3   < > 4.6 3.9 3.8  CL 92*   < > 90* 94* 93*  CO2 23   < > 20* 21* 21*  GLUCOSE 68*   < > 344* 275* 210*  BUN 100*   < > 108* 106* 114*  CREATININE 15.21*   < > 14.50* 12.97* 13.84*  CALCIUM 6.8*   < > 6.6* 6.8* 6.8*  PROT 6.0*  --   --   --   --   ALBUMIN 2.5*  --   --  2.1* 2.1*  AST 21  --   --   --   --  ALT 17  --   --   --   --   ALKPHOS 44  --   --   --   --   BILITOT 0.8  --   --   --   --   GFRNONAA 2*   < > 2* 3* 3*  GFRAA 3*   < > 3* 3* 3*  ANIONGAP 20*   < > 24* 17* 19*   < > = values in this interval not displayed.     Hematology Recent Labs  Lab 11/05/19 0415 11/07/19 0422 11/08/19 0334  WBC 10.6* 16.5* 12.3*  RBC 4.01 3.48* 3.47*  HGB 11.8* 10.1* 10.1*  HCT 35.7* 31.8* 31.7*  MCV 89.0 91.4 91.4  MCH 29.4 29.0 29.1  MCHC 33.1 31.8 31.9  RDW 15.5 15.8* 15.4  PLT 264 253 227    BNPNo results for input(s): BNP, PROBNP in the last 168 hours.   DDimer No results for input(s): DDIMER in the last 168 hours.   Radiology    No results found.  Cardiac Studies   Echo 1. Left ventricular ejection fraction, by estimation, is 60 to 65%. The  left ventricle has normal function. The left ventricle has no regional  wall motion abnormalities. There is moderate concentric left ventricular  hypertrophy. Left ventricular  diastolic parameters  are consistent with Grade I diastolic dysfunction  (impaired relaxation).  2. Right ventricular systolic function is normal. The right ventricular  size is normal. There is normal pulmonary artery systolic pressure.  3. The mitral valve is normal in structure and function. Trivial mitral  valve regurgitation. No evidence of mitral stenosis.  4. The aortic valve is normal in structure and function. Aortic valve  regurgitation is not visualized. No aortic stenosis is present.  5. The inferior vena cava is normal in size with greater than 50%  respiratory variability, suggesting right atrial pressure of 3 mmHg.   Patient Profile     64 y.o. female with history of ESRD on peritoneal dialysis, severe essential hypertension complicated by orthostatic hypotension leading to recurrent syncope, and type 2 diabetes, admitted with recurrent syncope in the setting of orthostatic hypotension and COVID-19 infection.  Assessment & Plan    COVID-19 infection  treated with remdesivir and steroids, does not appear to have any respiratory distress Prior admission October 28, 2019 at that time with general malaise, diarrhea and orthostasis Orthostasis and weakness on admission  Orthostasis Likely exacerbated by COVID-19 infection, and polypharmacy Most of her medications have been held Medications held including clonidine, prazosin, Coreg and Lasix, diltiazem Tolerating irbesartan 300 daily and low-dose carvedilol --Tolerating peritoneal dialysis Recommend she spend more time out of bed, check orthostatics  Stage renal disease on hemodialysis Peritoneal dialysis yesterday, nephrology following Case discussed with nephrology  PAD On aspirin, Lipitor Carotid ultrasound February 2021 Less than 50% disease bilaterally, vertebral vessels antegrade flow  Syncope Secondary to orthostasis in the setting of COVID-19 infection, diarrhea, prerenal state/anorexia, polypharmacy -Blood pressures appear  stable, if not elevated Further adjustments may be needed such as restarting some of her outpatient medications as she recovers from Covid   Total encounter time more than 25 minutes  Greater than 50% was spent in counseling and coordination of care with the patient   For questions or updates, please contact [facility] Please consult [email or web address] for contact info under        Signed, Ida Rogue, MD  11/08/2019, 3:52 PM

## 2019-11-08 NOTE — Progress Notes (Signed)
Physical Therapy Treatment Patient Details Name: Tracy Jennings MRN: [id number] DOB: October 22, 1955 Today's Date: 11/08/2019    History of Present Illness Tracy Jennings is a 33yoF who comes to [facility] s/p episode of syncope and collapse to floor. Pt (+) COVID 19. PMH: end-stage renal disease on peritoneal dialysis, HTN, type diabetes mellitus and peripheral vascular disease. Pt was here last week for same.    PT Comments    Pt was supine in bed upon arriving. Her lunch tray is in front of her but only a few bites gone. She reports having stomach pain (3/10) but is agreeable to PT session. Recalls having fall yesterday and is A and O x 4. BP 141/62 prior to sitting up EOB. Sat up EOB without requiring any assistance or cues. BP after sitting x 2 minutes 128/68. No symptoms or complains with going from supine to sit. Upon standing with HHA + 1 for ~ 10 sec, pt c/o feeling lightheaded and requested to sit. BP in standing 115/73. On 2nd trial, BP dropped to 103/53 and pt reports dizziness and increased nausea. Discontinued transfers. Pt returned to supine in bed and performed bed exercises (list below). HR and O2 levels WFL on room air. She tolerated session well but was fatigued afterwards and reports she feels "tired from medicine." PT will continue to follow pt per POC and progress as able. Continue to recommend pt D/C to home when medically stable with w/c for safety. PT will continue to follow her per POC.      Follow Up Recommendations  Home health PT;Supervision for mobility/OOB     Equipment Recommendations  Wheelchair (measurements PT);Wheelchair cushion (measurements PT)    Recommendations for Other Services       Precautions / Restrictions Precautions Precautions: Fall Restrictions Weight Bearing Restrictions: No    Mobility  Bed Mobility Overal bed mobility: Modified Independent             General bed mobility comments: Additional time required however no assistance or  vcs  Transfers Overall transfer level: Needs assistance Equipment used: 1 person hand held assist Transfers: Sit to/from Stand Sit to Stand: Min guard         General transfer comment: Pt performed STS 3 x EOB   Ambulation/Gait             General Gait Details: unsafe/unable to move away from EOB 2/2 to symptoms of dizziness/orthostatic hypotension   Stairs             Wheelchair Mobility    Modified Rankin (Stroke Patients Only)       Balance                                            Cognition Arousal/Alertness: Awake/alert Behavior During Therapy: WFL for tasks assessed/performed Overall Cognitive Status: Within Functional Limits for tasks assessed                                 General Comments: Pt is A and O and very pleasant. She agrees to PT session and able to follow commands throughout.      Exercises General Exercises - Lower Extremity Ankle Circles/Pumps: AROM;20 reps;Supine;Both Quad Sets: AROM;Both;10 reps;Supine Gluteal Sets: AROM;Both;10 reps;Supine Long Arc Quad: AROM;Both;15 reps;Seated Straight Leg Raises: AROM;Both;10 reps;Supine    General  Comments        Pertinent Vitals/Pain Pain Assessment: 0-10 Pain Score: 3  Pain Location: Stomach Pain Descriptors / Indicators: Aching Pain Intervention(s): Limited activity within patient's tolerance;Monitored during session;Other (comment)(pt's lunch tray in room barely touched. C/O stomach pain)    Home Living                      Prior Function            PT Goals (current goals can now be found in the care plan section) Acute Rehab PT Goals Patient Stated Goal: " I just want to feel better and not sleepy all the time" Progress towards PT goals: Not progressing toward goals - comment(orthostatic hypotension limiting progression)    Frequency    Min 2X/week      PT Plan Current plan remains appropriate    Co-evaluation               AM-PAC PT "6 Clicks" Mobility   Outcome Measure  Help needed turning from your back to your side while in a flat bed without using bedrails?: None Help needed moving from lying on your back to sitting on the side of a flat bed without using bedrails?: None Help needed moving to and from a bed to a chair (including a wheelchair)?: A Little Help needed standing up from a chair using your arms (e.g., wheelchair or bedside chair)?: A Little Help needed to walk in hospital room?: A Little Help needed climbing 3-5 steps with a railing? : A Little 6 Click Score: 20    End of Session   Activity Tolerance: Treatment limited secondary to medical complications (Comment);Patient tolerated treatment well Patient left: in bed;with bed alarm set;with call bell/phone within reach Nurse Communication: Mobility status PT Visit Diagnosis: Unsteadiness on feet (R26.81);Other abnormalities of gait and mobility (R26.89);Other symptoms and signs involving the nervous system (R29.898);Dizziness and giddiness (R42)     Time: 1315-1340 PT Time Calculation (min) (ACUTE ONLY): 25 min  Charges:  $Therapeutic Exercise: 8-22 mins $Therapeutic Activity: 8-22 mins                     Julaine Fusi PTA 11/08/19, 2:04 PM

## 2019-11-08 NOTE — Progress Notes (Signed)
PROGRESS NOTE    Tracy Jennings  QJJ:[id number] DOB: 06/26/1956 DOA: 11/03/2019 PCP: Tracy Jericho, NP      Brief Narrative:  Tracy Jennings is a 64 y.o. F with ESRD on PD, severe range HTN, DM, recent COVID infection and recurrent syncope due to orthostasis who presented with syncope.  In the ER, found to have COVID.  No orthostatics done in the ER.  Cardiology and Nephrology consulted.       Assessment & Plan:   Intractable nausea and dizziness Patient's nausea and dizziness are multifactorial.  Does not describe vertigo.  Lightheadedness was initially attributed to orthostasis (see below) but midodrine didn't help, and her BP has gradually stabilized on its own and her symptoms have only mildly improved.  In the last 48 hours, she has mostly attributed the symptoms to "pills/medicines" and her food.  I suspect there is also a large component to her symptoms that are just fatigue from COVID, compounded by being bed-ridden for now >1 week. Criss Rosales diet -All non-essential medicines have been stopped. -Supportive care   COVID, recovering Patient admitted for COVID 2/15 to 2/19.  Treated with 5 days remdesivir and steroids, minimal respiratory symptoms.  Discharged on steroid taper.  Now day 10 of isolation.  Needs isolation until Mar 8 Still no new respiratory symptoms -Continue prednisone taper, finish tomorrow   Syncope Chronic orthostatic hypotension Essential hyppertension Admitted.  Severely orthostatic at first (systolic pressures dropped 196 mmHg > 97 mmHg supine to standing). Nephrology recommended avoiding Fluorinef and midodrine.  Cardiology recommended cautious trial of midodrine, which failed.  At this point, patient's BP has stabilized, near her baseline.  She consistently refuses thigh and abdominal binders. -Hold midodrine  -Continue coreg, avapro -Prazosin and amlodipine have been held  ESRD Able to perform her own PD every night. -Consult  nephrology, appreciate cares  Peripheral vascular disease, secondary prevention -Continue aspirin, atorvastatin, Plavix  Diabetes GLucoses normal now -Continue Levemir -Continue SS correction insulin -Continue mealtime insulin, hold if <50% meal eaten  Anemia of CKD stable relative to baseline            Disposition: The patient was admitted with syncope and fatigue, inability to walk.  She is still unable to eat more than a few bites of food, is severely nauseated with any ambulation or exertion.  I will discharge when she is able to walk, tolerate oral intake consistently, which she is unable to yet        MDM: The below labs and imaging reports reviewed and summarized above.  Medication management as above.     DVT prophylaxis: SCDs Code Status: FULL Family Communication: husband by phone    Consultants:   Cardiology  Nephrology  Procedures:     Antimicrobials:      Culture data:              Subjective: Severely nauseated.  Fell yesterday afternoon trying to go from the bedside commode to the bed.  No vertigo.  No focal weakness or numbness, no speech difficulties.  No hematemesis, melena, no vomiting.  No fever.  No abdominal tenderness.  Objective: Vitals:   11/07/19 1410 11/08/19 0105 11/08/19 0500 11/08/19 0735  BP: 138/67 (!) 133/56  (!) 149/78  Pulse: 81 88  86  Resp:  18  18  Temp: 97.9 F (36.6 C) 98.2 F (36.8 C)  97.8 F (36.6 C)  TempSrc:  Oral  Oral  SpO2: 97% 97%  100%  Weight: 69.9 kg  75.7 kg   Height:       No intake or output data in the 24 hours ending 11/08/19 1455 Filed Weights   11/03/19 1731 11/07/19 1410 11/08/19 0500  Weight: 68.6 kg 69.9 kg 75.7 kg    Examination: General appearance: Well-nourished adult female, alert and in moderate distress from nausea, blanket over her head.   HEENT: Anicteric, conjunctiva pink, lids and lashes normal. No nasal deformity, discharge, epistaxis.  Lips  moist, OP moist, no oral lesions.   Skin: Warm and dry.  No suspicious rashes or lesions. Cardiac: RRR, no murmurs appreciated.  No LE edema.    Respiratory: Normal respiratory rate and rhythm.  CTAB without rales or wheezes. Abdomen: Abdomen soft.  No tenderness palpation or guarding. No ascites, distension, hepatosplenomegaly.   MSK: No deformities or effusions of the large joints of the upper or lower extremities bilaterally. Neuro: Awake and alert. Naming is grossly intact, and the patient's recall, recent and remote, as well as general fund of knowledge seem within normal limits.  Muscle tone diminished, without fasciculations.  Moves all extremities with generalized strength, normal coordination.  Speech fluent.    Psych: Sensorium intact and responding to questions, attention normal. Affect flat.  Judgment and insight appear normal.        Data Reviewed: I have personally reviewed following labs and imaging studies:  CBC: Recent Labs  Lab 11/03/19 1736 11/04/19 0617 11/05/19 0415 11/07/19 0422 11/08/19 0334  WBC 11.6* 10.3 10.6* 16.5* 12.3*  NEUTROABS 9.1*  --   --   --   --   HGB 11.9* 11.1* 11.8* 10.1* 10.1*  HCT 37.5 34.4* 35.7* 31.8* 31.7*  MCV 91.7 90.3 89.0 91.4 91.4  PLT 285 252 264 253 093   Basic Metabolic Panel: Recent Labs  Lab 11/03/19 1736 11/04/19 0617 11/05/19 0415 11/07/19 0422 11/08/19 0334  NA 135 134* 134* 132* 133*  K 4.3 4.5 4.6 3.9 3.8  CL 92* 93* 90* 94* 93*  CO2 23 20* 20* 21* 21*  GLUCOSE 68* 102* 344* 275* 210*  BUN 100* 98* 108* 106* 114*  CREATININE 15.21* 15.24* 14.50* 12.97* 13.84*  CALCIUM 6.8* 6.0* 6.6* 6.8* 6.8*  PHOS  --   --   --  6.0* 7.1*   GFR: Estimated Creatinine Clearance: 4.2 mL/min (A) (by C-G formula based on SCr of 13.84 mg/dL (H)). Liver Function Tests: Recent Labs  Lab 11/03/19 1736 11/07/19 0422 11/08/19 0334  AST 21  --   --   ALT 17  --   --   ALKPHOS 44  --   --   BILITOT 0.8  --   --   PROT 6.0*   --   --   ALBUMIN 2.5* 2.1* 2.1*   No results for input(s): LIPASE, AMYLASE in the last 168 hours. No results for input(s): AMMONIA in the last 168 hours. Coagulation Profile: Recent Labs  Lab 11/03/19 1736  INR 1.1   Cardiac Enzymes: No results for input(s): CKTOTAL, CKMB, CKMBINDEX, TROPONINI in the last 168 hours. BNP (last 3 results) No results for input(s): PROBNP in the last 8760 hours. HbA1C: No results for input(s): HGBA1C in the last 72 hours. CBG: Recent Labs  Lab 11/07/19 1140 11/07/19 1621 11/07/19 2100 11/08/19 0739 11/08/19 1104  GLUCAP 147* 83 140* 190* 131*   Lipid Profile: No results for input(s): CHOL, HDL, LDLCALC, TRIG, CHOLHDL, LDLDIRECT in the last 72 hours. Thyroid Function Tests: No results for input(s): TSH, T4TOTAL, FREET4, T3FREE, THYROIDAB  in the last 72 hours. Anemia Panel: No results for input(s): VITAMINB12, FOLATE, FERRITIN, TIBC, IRON, RETICCTPCT in the last 72 hours. Urine analysis:    Component Value Date/Time   COLORURINE YELLOW (A) 10/31/2017 1959   APPEARANCEUR HAZY (A) 10/31/2017 1959   APPEARANCEUR Hazy 12/09/2013 1046   LABSPEC 1.017 10/31/2017 1959   LABSPEC 1.031 12/09/2013 1046   PHURINE 6.0 10/31/2017 1959   GLUCOSEU >=500 (A) 10/31/2017 1959   GLUCOSEU >=500 12/09/2013 1046   HGBUR NEGATIVE 10/31/2017 [place] NEGATIVE 10/31/2017 1959   BILIRUBINUR Negative 12/09/2013 1046   [place] 10/31/2017 1959   PROTEINUR >=300 (A) 10/31/2017 1959   NITRITE NEGATIVE 10/31/2017 1959   LEUKOCYTESUR NEGATIVE 10/31/2017 1959   LEUKOCYTESUR Negative 12/09/2013 1046   Sepsis Labs: @LABRCNTIP (procalcitonin:4,lacticacidven:4)  ) No results found for this or any previous visit (from the past 240 hour(s)).       Radiology Studies: No results found.      Scheduled Meds: . carvedilol  6.25 mg Oral BID WC  . clopidogrel  75 mg Oral Daily  . gentamicin cream  1 application Topical Daily  . insulin  aspart  0-15 Units Subcutaneous TID WC  . insulin aspart  0-5 Units Subcutaneous QHS  . insulin aspart  3 Units Subcutaneous TID WC  . insulin detemir  10 Units Subcutaneous Daily  . irbesartan  300 mg Oral Daily  . Melatonin  5 mg Oral QHS  . multivitamin  1 tablet Oral QHS  . [START ON 11/09/2019] predniSONE  10 mg Oral Once  . senna-docusate  2 tablet Oral BID   Continuous Infusions: . dialysis solution 1.5% low-MG/low-CA       LOS: 4 days    Time spent: 25 minutes    Edwin Dada, MD [facility] 11/08/2019, 2:55 PM     Please page though [place] or Epic secure chat:  For [employer], Adult nurse

## 2019-11-08 NOTE — Progress Notes (Signed)
Patient suffers from orthostatic hypotension and dizziness which impairs their ability to perform daily activities like walking, dressing, bathing  in the home.  A walker will not resolve  issue with performing activities of daily living. A wheelchair will allow patient to safely perform daily activities. Patient is not able to propel themselves in the home using a standard weight wheelchair due to weakness. Patient can self propel in the lightweight wheelchair. Length of need lifetime. Accessories: elevating leg rests (ELRs), wheel locks, extensions and anti-tippers, and seat and back cushion.

## 2019-11-08 NOTE — Progress Notes (Signed)
[facility]  ROUNDING NOTE   Subjective:   Peritoneal dialysis last night. Patient tolerated treatment well. UF of 324mL.   Objective:  Vital signs in last 24 hours:  Temp:  [97.8 F (36.6 C)-98.2 F (36.8 C)] 97.8 F (36.6 C) (02/26 0735) Pulse Rate:  [81-88] 86 (02/26 0735) Resp:  [18] 18 (02/26 0735) BP: (133-162)/(56-83) 149/78 (02/26 0735) SpO2:  [97 %-100 %] 100 % (02/26 0735) Weight:  [69.9 kg-75.7 kg] 75.7 kg (02/26 0500)  Weight change:  Filed Weights   11/03/19 1731 11/07/19 1410 11/08/19 0500  Weight: 68.6 kg 69.9 kg 75.7 kg    Intake/Output: I/O last 3 completed shifts: In: 120 [P.O.:120] Out: 261 [Other:261]   Intake/Output this shift:  No intake/output data recorded.  Physical Exam: General: NAD, laying in bed  Head: Normocephalic, atraumatic. Moist oral mucosal membranes  Eyes: Anicteric, PERRL  Neck: Supple, trachea midline  Lungs:  Clear to auscultation  Heart: Regular rate and rhythm  Abdomen:  soft  Extremities:  no peripheral edema.  Neurologic: Nonfocal, moving all four extremities  Skin: No lesions  Access: PD catheter    Basic Metabolic Panel: Recent Labs  Lab 11/03/19 1736 11/03/19 1736 11/04/19 0617 11/04/19 0617 11/05/19 0415 11/07/19 0422 11/08/19 0334  NA 135  --  134*  --  134* 132* 133*  K 4.3  --  4.5  --  4.6 3.9 3.8  CL 92*  --  93*  --  90* 94* 93*  CO2 23  --  20*  --  20* 21* 21*  GLUCOSE 68*  --  102*  --  344* 275* 210*  BUN 100*  --  98*  --  108* 106* 114*  CREATININE 15.21*  --  15.24*  --  14.50* 12.97* 13.84*  CALCIUM 6.8*   < > 6.0*   < > 6.6* 6.8* 6.8*  PHOS  --   --   --   --   --  6.0* 7.1*   < > = values in this interval not displayed.    Liver Function Tests: Recent Labs  Lab 11/03/19 1736 11/07/19 0422 11/08/19 0334  AST 21  --   --   ALT 17  --   --   ALKPHOS 44  --   --   BILITOT 0.8  --   --   PROT 6.0*  --   --   ALBUMIN 2.5* 2.1* 2.1*   No results for input(s):  LIPASE, AMYLASE in the last 168 hours. No results for input(s): AMMONIA in the last 168 hours.  CBC: Recent Labs  Lab 11/03/19 1736 11/04/19 0617 11/05/19 0415 11/07/19 0422 11/08/19 0334  WBC 11.6* 10.3 10.6* 16.5* 12.3*  NEUTROABS 9.1*  --   --   --   --   HGB 11.9* 11.1* 11.8* 10.1* 10.1*  HCT 37.5 34.4* 35.7* 31.8* 31.7*  MCV 91.7 90.3 89.0 91.4 91.4  PLT 285 252 264 253 227    Cardiac Enzymes: No results for input(s): CKTOTAL, CKMB, CKMBINDEX, TROPONINI in the last 168 hours.  BNP: Invalid input(s): POCBNP  CBG: Recent Labs  Lab 11/07/19 0753 11/07/19 1140 11/07/19 1621 11/07/19 2100 11/08/19 0739  GLUCAP 223* 147* 83 140* 190*    Microbiology: Results for orders placed or performed during the hospital encounter of 10/28/19  Respiratory Panel by RT PCR (Flu A&B, Covid) - Nasopharyngeal Swab     Status: Abnormal   Collection Time: 10/28/19  9:38 PM   Specimen:  Nasopharyngeal Swab  Result Value Ref Range Status   SARS Coronavirus 2 by RT PCR POSITIVE (A) NEGATIVE Final    Comment: RESULT CALLED TO, READ BACK BY AND VERIFIED WITH: Charolotte Capuchin RN [id number] 10/28/19 HNM (NOTE) SARS-CoV-2 target nucleic acids are DETECTED. SARS-CoV-2 RNA is generally detectable in upper respiratory specimens  during the acute phase of infection. Positive results are indicative of the presence of the identified virus, but do not rule out bacterial infection or co-infection with other pathogens not detected by the test. Clinical correlation with patient history and other diagnostic information is necessary to determine patient infection status. The expected result is Negative. Fact Sheet for Patients:  PinkCheek.be Fact Sheet for Healthcare Providers: GravelBags.it This test is not yet approved or cleared by the [place] FDA and  has been authorized for detection and/or diagnosis of SARS-CoV-2 by FDA under an  Emergency Use Authorization (EUA).  This EUA will remain in effect (meaning this test can be used) f or the duration of  the COVID-19 declaration under Section 564(b)(1) of the Act, 21 U.S.C. section 360bbb-3(b)(1), unless the authorization is terminated or revoked sooner.    Influenza A by PCR NEGATIVE NEGATIVE Final   Influenza B by PCR NEGATIVE NEGATIVE Final    Comment: (NOTE) The Xpert Xpress SARS-CoV-2/FLU/RSV assay is intended as an aid in  the diagnosis of influenza from Nasopharyngeal swab specimens and  should not be used as a sole basis for treatment. Nasal washings and  aspirates are unacceptable for Xpert Xpress SARS-CoV-2/FLU/RSV  testing. Fact Sheet for Patients: PinkCheek.be Fact Sheet for Healthcare Providers: GravelBags.it This test is not yet approved or cleared by the [place] FDA and  has been authorized for detection and/or diagnosis of SARS-CoV-2 by  FDA under an Emergency Use Authorization (EUA). This EUA will remain  in effect (meaning this test can be used) for the duration of the  Covid-19 declaration under Section 564(b)(1) of the Act, 21  U.S.C. section 360bbb-3(b)(1), unless the authorization is  terminated or revoked. Performed at [facility], [address] [place], [place] [address]     Coagulation Studies: No results for input(s): LABPROT, INR in the last 72 hours.  Urinalysis: No results for input(s): COLORURINE, LABSPEC, PHURINE, GLUCOSEU, HGBUR, BILIRUBINUR, KETONESUR, PROTEINUR, UROBILINOGEN, NITRITE, LEUKOCYTESUR in the last 72 hours.  Invalid input(s): APPERANCEUR    Imaging: No results found.   Medications:   . dialysis solution 1.5% low-MG/low-CA     . carvedilol  6.25 mg Oral BID WC  . clopidogrel  75 mg Oral Daily  . gentamicin cream  1 application Topical Daily  . insulin aspart  0-15 Units Subcutaneous TID WC  . insulin aspart  0-5 Units  Subcutaneous QHS  . insulin aspart  3 Units Subcutaneous TID WC  . insulin detemir  10 Units Subcutaneous Daily  . irbesartan  300 mg Oral Daily  . Melatonin  5 mg Oral QHS  . multivitamin  1 tablet Oral QHS  . [START ON 11/09/2019] predniSONE  10 mg Oral Once  . senna-docusate  2 tablet Oral BID   acetaminophen **OR** acetaminophen, guaiFENesin-dextromethorphan, ondansetron **OR** ondansetron (ZOFRAN) IV  Assessment/ Plan:  Tracy Jennings is a 64 y.o. black female with end stage renal disease on peritoneal dialysis, hypertension, peripheral vascular disease, hyperlipidemia, diabetes mellitus type II who has been readmitted to [facility] on 11/03/2019 for Orthostatic hypotension [I95.1] ESRD (end stage renal disease) ([place]) [N18.6] Generalized weakness [R53.1] Recurrent syncope [R55] COVID-19 [  U07.1] Syncope [R55]  [facility] 66kg CCPD 9 hours 4 exchanges 2.5 liter fills.   1. End Stage Renal Disease: tolerated peritoneal dialysis last night.  - CCPD for tonight. Orders prepared. Dextrose 1.5%.   2. Hypertension:. Home regimen of carvedilol, clonidine, furosemide, losartan, prazosin.  However with orthostatic hypotension secondary to autonomic neuropathy.  - restarted carvedilol and irbesartan - Would recommend holding prazosin has this can cause orthostatics.   3. Anemia of chronic kidney disease: hemoglobin 10.1 EPO as outpatient.   4. Secondary Hyperparathyroidism: with hyperphosphatemia and hypocalcemia.  - calcitriol - calcium acetate with meals.    LOS: 4 Sharnise Blough 2/26/202110:56 AM

## 2019-11-08 NOTE — Progress Notes (Signed)
Pt tolerated PD tx with UF of 324ml and dwell time of 2.:02 hrx

## 2019-11-08 NOTE — Progress Notes (Signed)
Occupational Therapy Treatment Patient Details Name: Tracy Jennings MRN: [id number] DOB: 24-Dec-1955 Today's Date: 11/08/2019    History of present illness Abrish Erny is a 89yoF who comes to [facility] s/p episode of syncope and collapse to floor. Pt (+) COVID 19. PMH: end-stage renal disease on peritoneal dialysis, HTN, type diabetes mellitus and peripheral vascular disease. Pt was here last week for same.   OT comments  Patient seen this afternoon for OT session.  OT targeting ADL retraining this session.  Patient noted to be very lethargic and agreeable only to simple grooming tasks at bed level.  Patient able to complete these tasks at set up with extra time this date.  Provided education on importance of attempting to sit up as able to improve overall health, how to position self in bed for safety, and use of call light when needing assistance.  Patient verbalized understanding of education.  Patient would benefit from continued skilled OT to address activity tolerance, strength, functional transfers, ADL retraining, energy conservation techniques, and safety awareness.  Based on today's performance, discharge recommendations remain appropriate.      Follow Up Recommendations  Home health OT;Supervision - Intermittent(SPV still recommended for OOB activities)    Equipment Recommendations  3 in 1 bedside commode;Other (comment)(wheelchair)    Recommendations for Other Services      Precautions / Restrictions Precautions Precautions: Fall Precaution Comments: monitor blood pressure Restrictions Weight Bearing Restrictions: No Other Position/Activity Restrictions: Orthostatic hypotension       Mobility Bed Mobility Overal bed mobility: Modified Independent             General bed mobility comments: Able to reposition self in bed with MOD I with use of bed railing.  Transfers          General transfer comment: Did not attempt transfers during this session per patient  request.    Balance Overall balance assessment: History of Falls                                         ADL either performed or assessed with clinical judgement   ADL Overall ADL's : Needs assistance/impaired     Grooming: Wash/dry hands;Wash/dry face;Set up;Bed level Grooming Details (indicate cue type and reason): Declined brushing teeth                               General ADL Comments: Patient very lethargic this date.  Agreeable to only simple grooming at bed level.  Able to complete with set up and exta time.     Vision Patient Visual Report: No change from baseline     Perception     Praxis      Cognition Arousal/Alertness: Awake/alert Behavior During Therapy: WFL for tasks assessed/performed;Agitated(Patient with concerns about blood pressure) Overall Cognitive Status: Within Functional Limits for tasks assessed                                 General Comments: Noted to be more lethargic this date and agreeable to only simple grooming.        Exercises  Other Exercises Other Exercises: Educated patient on importance of attempting to sit up to improve overall health Other Exercises: Educated patient on positioning in bed to reduce risk of skin  integrity issues Other Exercises: Educated on use of call light when needing assistance   Shoulder Instructions       General Comments Patient very lethargic this date. No note of pain during session.  Agreed only to simple grooming.    Pertinent Vitals/ Pain       Pain Assessment: No/denies pain(No complaints of pain while supine in bed) Pain Score: 3  Pain Location: Stomach Pain Descriptors / Indicators: Aching Pain Intervention(s): Limited activity within patient's tolerance;Monitored during session;Other (comment)(pt's lunch tray in room barely touched. C/O stomach pain)  Home Living                                          Prior  Functioning/Environment              Frequency  Min 1X/week        Progress Toward Goals  OT Goals(current goals can now be found in the care plan section)  Progress towards OT goals: OT to reassess next treatment  Acute Rehab OT Goals Patient Stated Goal: " I just want to feel better and not sleepy all the time"  Plan Discharge plan remains appropriate    Co-evaluation                 AM-PAC OT "6 Clicks" Daily Activity     Outcome Measure   Help from another person eating meals?: None Help from another person taking care of personal grooming?: None Help from another person toileting, which includes using toliet, bedpan, or urinal?: A Little Help from another person bathing (including washing, rinsing, drying)?: A Lot Help from another person to put on and taking off regular upper body clothing?: A Little Help from another person to put on and taking off regular lower body clothing?: A Little 6 Click Score: 19    End of Session    OT Visit Diagnosis: History of falling (Z91.81)   Activity Tolerance Patient limited by lethargy   Patient Left in bed;with call bell/phone within reach;with bed alarm set;with nursing/sitter in room   Nurse Communication Other (comment)(Discussed patient's lab values and BP prior to treatment)        Time: 1550-1604 OT Time Calculation (min): 14 min  Charges: OT General Charges $OT Visit: 1 Visit OT Treatments $Self Care/Home Management : 8-22 mins  Baldomero Lamy, MS, OTR/L 11/08/19, 4:27 PM

## 2019-11-08 NOTE — TOC Progression Note (Signed)
Transition of Care [facility]) - Progression Note    Patient Details  Name: Tracy Jennings MRN: [id number] Date of Birth: Oct 14, 1955  [facility]) CM/SW Contact  Shelbie Hutching, RN Phone Number: 11/08/2019, 2:36 PM  Clinical Narrative:    Plan for probable discharge home tomorrow with home health services.  Patient needs a wheelchair, it has been ordered and [employer] [facility] will deliver to the home.  [facility] will provide home health services.  Floydene Flock with [facility] aware of potential discharge for tomorrow.    Expected Discharge Plan: [place] Barriers to Discharge: Continued Medical Work up  Expected Discharge Plan and Services Expected Discharge Plan: [place]   Discharge Planning Services: CM Consult Post Acute Care Choice: Durable Medical Equipment, Home Health Living arrangements for the past 2 months: Single Family Home                 DME Arranged: Youth worker wheelchair with seat cushion DME Agency: [employer] Date DME Agency Contacted: 11/04/19 Time DME Agency Contacted: 1627 Representative spoke with at DME Agency: [place]   [place]: [place] ([place]) Date [place]: 11/04/19 Time [place]: 1247 Representative spoke with at [place]: [place] Determinants of Health (SDOH) Interventions    Readmission Risk Interventions No flowsheet data found.

## 2019-11-09 LAB — GLUCOSE, CAPILLARY
Glucose-Capillary: 155 mg/dL — ABNORMAL HIGH (ref 70–99)
Glucose-Capillary: 140 mg/dL — ABNORMAL HIGH (ref 70–99)

## 2019-11-09 MED ORDER — ONDANSETRON HCL 4 MG PO TABS: 4.0000 mg | ORAL_TABLET | Freq: Four times a day (QID) | ORAL | 1 refills | Status: DC | PRN

## 2019-11-09 MED ORDER — FUROSEMIDE 80 MG PO TABS: 80.0000 mg | ORAL_TABLET | Freq: Every day | ORAL | 3 refills | Status: DC

## 2019-11-09 NOTE — TOC Transition Note (Signed)
Transition of Care [facility]) - CM/SW Discharge Note   Patient Details  Name: Tracy Jennings MRN: [id number] Date of Birth: [birth year]  [facility]) CM/SW Contact:  Boris Sharper, LCSW Phone Number: [phone] 11/09/2019, 12:16 PM   Clinical Narrative:    Patent medically stable for discharge. CSW notified pt Spouse who will be transporting her home. CSW notified Corene Cornea with [facility] of discharge.   Final next level of care: [place] Barriers to Discharge: No Barriers Identified   Patient Goals and CMS Choice Patient states their goals for this hospitalization and ongoing recovery are:: to get better CMS Medicare.gov Compare Post Acute Care list provided to:: Patient Choice offered to / list presented to : Patient  Discharge Placement                Patient to be transferred to facility by: Spouse Name of family member notified: Erich Montane Patient and family notified of of transfer: 11/09/19  Discharge Plan and Services   Discharge Planning Services: CM Consult Post Acute Care Choice: Durable Medical Equipment, Home Health          DME Arranged: Lightweight manual wheelchair with seat cushion DME Agency: [employer] Date DME Agency Contacted: 11/04/19 Time DME Agency Contacted: 1627 Representative spoke with at DME Agency: [place]: PT, OT [facility] Agency: [place] ([place]) Date HH Agency Contacted: 11/09/19 Time [place]: 1215 Representative spoke with at [place]: [place] ([place]) Interventions     Readmission Risk Interventions No flowsheet data found.

## 2019-11-09 NOTE — Progress Notes (Signed)
Pd completed, exit site dreesing clean and secure changed last night per patient. Patient reports no problems with last nights therapy.  Hoping to go home today.

## 2019-11-09 NOTE — Discharge Summary (Signed)
Physician Discharge Summary  [name] ELF:[id number] DOB: [birth year] DOA: 11/03/2019  PCP: Ricardo Jericho, NP  Admit date: 11/03/2019 Discharge date: 11/09/2019  Admitted From: Home  Disposition:  Home   Recommendations for Outpatient Follow-up:  1. Follow up with Nephrology in 1-2 weeks after [place]: PT and OT given severe balance difficulties making it impossible to elave home safely  Equipment/Devices: 3-in-1, walker  Discharge Condition: Fair  CODE STATUS: FULL Diet recommendation: Renal  Brief/Interim Summary: Tracy Jennings is a 64 y.o. F with ESRD on PD, severe range HTN, DM, recent COVID infection and recurrent syncope due to orthostasis who presented with syncope.  In the ER, found to have COVID.  No orthostatics done in the ER.  Cardiology and Nephrology consulted.     PRINCIPAL HOSPITAL DIAGNOSIS: Intractable nausea and dizziness and vomiting due to COVID and acute on chronic orthostatic hypotension    Discharge Diagnoses:   Intractable nausea and dizziness Patient presented after collapsing at home due to dizziness/lightheadedness.  In the hospital, it appeared that her nausea and dizziness were multifactorial.  Did not describe vertigo.  Lightheadedness was initially attributed to orthostasis (see below) but midodrine didn't help, her BP gradually stabilized on its own without substantial improvement in symptoms.     She subsequently described severe nausea and anorexia attributable to "pills/medicines" and her food.  I suspect there is also a large component to her symptoms that are just fatigue, nausea/anorexia and malaise from COVID, compounded by being bed-ridden for now >1 week.  At discharge, patient counseled extensively on bland diet, holding some of her blood pressure medicines for now, and slowly restarting.     Syncope Chronic orthostatic hypotension Essential hyppertension Has chronic  orthostasis.  Severely orthostatic at first (systolic pressures dropped 196 mmHg --> 97 mmHg supine to standing). Cautious trial of midodrine at first, which failed to improve symptoms. Patient consistently refuses thigh and abdominal binders.  At discharge, continue Coreg, ARB Hold prazosin and clonidine and furosemide.  Restart cautiously as able.     COVID, recovering Patient admitted for COVID 2/15 to 2/19.  Treated with 5 days remdesivir and steroids, minimal respiratory symptoms.  Discharged on steroid taper.  Now day 10 of isolation.  Needs isolation until Mar 8  ESRD Able to perform her own PD every night.  Peripheral vascular disease, secondary prevention Continue aspirin, atorvastatin, Plavix  Diabetes  Anemia of CKD Stable relative to baseline               Discharge Instructions  Discharge Instructions    Discharge instructions   Complete by: As directed    You were admitted for weakness and fatigue and passing out, which were all likely caused by residual COVID symptoms, in addition to your previous blood pressure dropping problem (orthostatic hypotension)  For now: Take your carvedilol and Valsartan and furosemide Do NOT take your clonidine, diltiazem and prazosin  Call Dr. Keturah Barre office for a follow up appoinjtment after Mar 8  Do your normal dialysis  If you have nausea, take ondansetron 4 mg up to every 6 hours, this is an anti-nausea medicine  For the next few weeks: Eat a bland diet (no meat, only soft foods, light foods like bananas, rice, toast, crackers, pretzels, applesauce, fruit, things that are easy on the stomach)  Also, you may hold a few of your medicines for now: Hold off on your aspirin and atorvastatin/Lipitor Hold off on any vitamins Go back to  taking these in a few weeks when you are felnig better  When you stand up -- MAKE SURE you get up slowly.  Wait 1-2 minutes before you walk anywhere.  Do not walk anywhere  without someone by your side for the next week or so, until you feel stronger  Hold off on your calcitriol and Phoslo     HOW LONG TO REMAIN IN QUARANTINE: There is no absolutely correct answer to this and so our best answer is to be on the cautious side.  Based on what we know of the virus, you should isolate strictly until 21 days from your first symptoms, in your case Mar 8  Until you end your quarantine: If you have anyone in the home who has NOT had coronavirus:    -do not be in the same room with them until your self isolation is over    -if you MUST be in the same room, make sure you wear a mask and have them wear a mask and safety glasses (if available)    -clean all hard surfaces (counters, doors, tables) twice a day    -use a separate bathroom at all times   Increase activity slowly   Complete by: As directed      Allergies as of 11/09/2019      Reactions   Amlodipine Swelling   Knees down to ankles Knees down to ankles   Hctz [hydrochlorothiazide]    pancreatitis   Heparin Other (See Comments)   Pt reports cardiac arrest when given heparin   Lmw Heparin    Reports cardiac arrest when given heparin   Other Other (See Comments)   Reports cardiac arrest when given during surgery Reports cardiac arrest when given during surgery Reports cardiac arrest when given during surgery   Amlodipine Besylate Swelling   Sulfa Antibiotics Rash   Dairy Aid [lactase]    Runny nose   Hydralazine Nausea Only      Medication List    STOP taking these medications   cloNIDine 0.1 MG tablet Commonly known as: CATAPRES   cloNIDine 0.2 MG tablet Commonly known as: CATAPRES   diltiazem 120 MG 24 hr capsule Commonly known as: DILACOR XR   prazosin 2 MG capsule Commonly known as: MINIPRESS   predniSONE 10 MG (21) Tbpk tablet Commonly known as: STERAPRED UNI-PAK 21 TAB     TAKE these medications   aspirin EC 81 MG tablet Take 81 mg by mouth daily.   atorvastatin 40 MG  tablet Commonly known as: LIPITOR Take 40 mg by mouth daily at 6 PM.   calcitRIOL 0.25 MCG capsule Commonly known as: ROCALTROL Take 0.25 mcg by mouth daily.   calcium acetate 667 MG capsule Commonly known as: PHOSLO Take 1,334 mg by mouth 3 (three) times daily.   carvedilol 25 MG tablet Commonly known as: COREG Take 25 mg by mouth 2 (two) times daily with a meal.   fluticasone 50 MCG/ACT nasal spray Commonly known as: FLONASE 2 sprays by Each Nare route daily.   furosemide 80 MG tablet Commonly known as: LASIX Take 1 tablet (80 mg total) by mouth daily.   gentamicin cream 0.1 % Commonly known as: GARAMYCIN Apply 1 application topically 3 (three) times daily.   insulin aspart 100 UNIT/ML FlexPen Commonly known as: NOVOLOG Inject 10-12 Units into the skin 3 (three) times daily with meals.   Insulin Glargine 300 UNIT/ML Sopn Inject 40 Units into the skin daily. In the morning.  (toujeo)  ondansetron 4 MG tablet Commonly known as: ZOFRAN Take 1 tablet (4 mg total) by mouth every 6 (six) hours as needed for nausea.   Plavix 75 MG tablet Generic drug: clopidogrel Take 75 mg by mouth daily.   Precision QID Test test strip Generic drug: glucose blood Use once daily One Touch Ultra Blue   valsartan 160 MG tablet Commonly known as: DIOVAN Take 160 mg by mouth daily.   Vitamin D (Ergocalciferol) 1.25 MG (50000 UNIT) Caps capsule Commonly known as: DRISDOL TAKE ONE CAPSULE BY MOUTH ONE TIME PER WEEK            Durable Medical Equipment  (From admission, onward)         Start     Ordered   11/08/19 1604  For home use only DME lightweight manual wheelchair with seat cushion  Once    Comments: Patient suffers from orthostatic hypotension and dizziness which impairs their ability to perform daily activities like walking, dressing, bathing  in the home.  A walker will not resolve  issue with performing activities of daily living. A wheelchair will allow patient to  safely perform daily activities. Patient is not able to propel themselves in the home using a standard weight wheelchair due to weakness. Patient can self propel in the lightweight wheelchair. Length of need lifetime. Accessories: elevating leg rests (ELRs), wheel locks, extensions and anti-tippers, and seat and back cushion.   11/08/19 1605   11/04/19 1624  For home use only DME wheelchair cushion (seat and back)  Once     11/04/19 1623         Follow-up Information    White, Orlene Och, NP. Schedule an appointment as soon as possible for a visit in 2 week(s).   Specialty: Family Medicine Contact information: [place] [address] [phone]        Murlean Iba, MD. Schedule an appointment as soon as possible for a visit in 1 month(s).   Specialty: Nephrology Contact information: [place] [address] [phone]          Allergies  Allergen Reactions  . Amlodipine Swelling    Knees down to ankles Knees down to ankles  . Hctz [Hydrochlorothiazide]     pancreatitis  . Heparin Other (See Comments)    Pt reports cardiac arrest when given heparin  . Lmw Heparin     Reports cardiac arrest when given heparin  . Other Other (See Comments)    Reports cardiac arrest when given during surgery Reports cardiac arrest when given during surgery Reports cardiac arrest when given during surgery   . Amlodipine Besylate Swelling  . Sulfa Antibiotics Rash  . Dairy Aid [Lactase]     Runny nose  . Hydralazine Nausea Only    Consultations:  Nephrology   Cardiology   Procedures/Studies: CT Head Wo Contrast  Result Date: 10/28/2019 CLINICAL DATA:  [place], altered level of consciousness, headache EXAM: CT HEAD WITHOUT CONTRAST TECHNIQUE: Contiguous axial images were obtained from the base of the skull through the vertex without intravenous contrast. COMPARISON:  05/16/2018 FINDINGS: Brain: No acute infarct or hemorrhage.  Lateral ventricles and midline structures appear unremarkable. No acute extra-axial fluid collections. No mass effect. Vascular: No hyperdense vessel or unexpected calcification. Skull: Normal. Negative for fracture or focal lesion. Sinuses/Orbits: No acute finding. Other: None IMPRESSION: 1. Stable head CT, no acute process. Electronically Signed   By: Randa Ngo M.D.   On: 10/28/2019 21:26  CT Cervical Spine Wo Contrast  Result Date: 10/28/2019 CLINICAL DATA:  [place], altered level of consciousness EXAM: CT CERVICAL SPINE WITHOUT CONTRAST TECHNIQUE: Multidetector CT imaging of the cervical spine was performed without intravenous contrast. Multiplanar CT image reconstructions were also generated. COMPARISON:  None. FINDINGS: Alignment: Cervical vertebral bodies are in grossly anatomic alignment. Skull base and vertebrae: There are no acute displaced fractures. Soft tissues and spinal canal: No prevertebral fluid or swelling. No visible canal hematoma. Disc levels: Multilevel cervical spondylosis is identified, most pronounced at the C5-6, C6-7, and C7/T1 levels. There is disc space narrowing, circumferential disc osteophyte complex, and uncovertebral hypertrophy. Mild symmetrical neural foraminal encroachment at C5/C6. Upper chest: Central airways patent. Scattered pleural and parenchymal scarring at the lung apices. Other: Reconstructed images demonstrate no additional findings. IMPRESSION: 1. Multilevel cervical spondylosis.  No acute fractures. Electronically Signed   By: Randa Ngo M.D.   On: 10/28/2019 21:28   US Carotid Bilateral  Result Date: 10/29/2019 CLINICAL DATA:  Syncope EXAM: BILATERAL CAROTID DUPLEX ULTRASOUND TECHNIQUE: Pearline Cables scale imaging, color Doppler and duplex ultrasound were performed of bilateral carotid and vertebral arteries in the neck. Technologist describes technically difficult study secondary to lack of patient cooperation, and vessel movement with respirations.  COMPARISON:  None. FINDINGS: Criteria: Quantification of carotid stenosis is based on velocity parameters that correlate the residual internal carotid diameter with NASCET-based stenosis levels, using the diameter of the distal internal carotid lumen as the denominator for stenosis measurement. The following velocity measurements were obtained: RIGHT ICA: 161/29 cm/sec CCA: 16/10 cm/sec SYSTOLIC ICA/CCA RATIO:  1.7 ECA: 150 cm/sec LEFT ICA: 90/29 cm/sec CCA: 960/45 cm/sec SYSTOLIC ICA/CCA RATIO:  0.8 ECA: 112 cm/sec RIGHT CAROTID ARTERY: Smooth partially calcified plaque in the bulb and proximal ICA. No high-grade stenosis. Normal waveforms and color Doppler signal. Mild tortuosity. The measured elevated peak systolic velocities are probably overestimated due to poor angle correction. RIGHT VERTEBRAL ARTERY:  Normal flow direction and waveform. LEFT CAROTID ARTERY: Mild tortuosity. Calcified plaque at the carotid bifurcation and proximal ICA. No high-grade stenosis. Normal waveforms and color Doppler signal. LEFT VERTEBRAL ARTERY:  Normal flow direction and waveform. IMPRESSION: 1. Bilateral carotid bifurcation plaque resulting in less than 50% diameter ICA stenosis. 2. Antegrade bilateral vertebral arterial flow. Electronically Signed   By: Lucrezia Europe M.D.   On: 10/29/2019 13:43   DG Chest Port 1 View  Result Date: 10/29/2019 CLINICAL DATA:  COVID positive. EXAM: PORTABLE CHEST 1 VIEW COMPARISON:  05/20/2018 FINDINGS: 3:22 p.m. Patchy bilateral airspace disease noted, right greater than left. The cardiopericardial silhouette is within normal limits for size. The visualized bony structures of the thorax are intact. Telemetry leads overlie the chest. IMPRESSION: Patchy bilateral airspace opacity consistent with multifocal pneumonia. Electronically Signed   By: Misty Stanley M.D.   On: 10/29/2019 17:22   ECHOCARDIOGRAM COMPLETE  Result Date: 10/29/2019    ECHOCARDIOGRAM REPORT   Patient Name:   Tracy Jennings Date of Exam: 10/29/2019 Medical Rec #:  [id number]        Height:       63.0 in Accession #:    [id number]       Weight:       150.0 lb Date of Birth:  12-11-1955        BSA:          1.71 m Patient Age:    33 years         BP:  188/71 mmHg Patient Gender: F                HR:           82 bpm. Exam Location:  [facility] Procedure: 2D Echo, Cardiac Doppler and Color Doppler Indications:     Syncope 780.2  History:         Patient has prior history of Echocardiogram examinations, most                  recent 05/12/2018. Signs/Symptoms:Murmur; Risk                  Factors:Hypertension and Diabetes. PVD.  Sonographer:     Sherrie Sport RDCS (AE) Referring Phys:  [id number] [place] Diagnosing Phys: Neoma Laming MD IMPRESSIONS  1. Left ventricular ejection fraction, by estimation, is 60 to 65%. The left ventricle has normal function. The left ventricle has no regional wall motion abnormalities. There is moderate concentric left ventricular hypertrophy. Left ventricular diastolic parameters are consistent with Grade I diastolic dysfunction (impaired relaxation).  2. Right ventricular systolic function is normal. The right ventricular size is normal. There is normal pulmonary artery systolic pressure.  3. The mitral valve is normal in structure and function. Trivial mitral valve regurgitation. No evidence of mitral stenosis.  4. The aortic valve is normal in structure and function. Aortic valve regurgitation is not visualized. No aortic stenosis is present.  5. The inferior vena cava is normal in size with greater than 50% respiratory variability, suggesting right atrial pressure of 3 mmHg. FINDINGS  Left Ventricle: Left ventricular ejection fraction, by estimation, is 60 to 65%. The left ventricle has normal function. The left ventricle has no regional wall motion abnormalities. The left ventricular internal cavity size was normal in size. There is  moderate concentric left ventricular hypertrophy. Left  ventricular diastolic parameters are consistent with Grade I diastolic dysfunction (impaired relaxation). Right Ventricle: The right ventricular size is normal. No increase in right ventricular wall thickness. Right ventricular systolic function is normal. There is normal pulmonary artery systolic pressure. The tricuspid regurgitant velocity is 1.75 m/s, and  with an assumed right atrial pressure of 10 mmHg, the estimated right ventricular systolic pressure is 43.3 mmHg. Left Atrium: Left atrial size was normal in size. Right Atrium: Right atrial size was normal in size. Pericardium: There is no evidence of pericardial effusion. Mitral Valve: The mitral valve is normal in structure and function. Normal mobility of the mitral valve leaflets. Trivial mitral valve regurgitation. No evidence of mitral valve stenosis. Tricuspid Valve: The tricuspid valve is normal in structure. Tricuspid valve regurgitation is trivial. No evidence of tricuspid stenosis. Aortic Valve: The aortic valve is normal in structure and function. Aortic valve regurgitation is not visualized. No aortic stenosis is present. Aortic valve mean gradient measures 4.3 mmHg. Aortic valve peak gradient measures 7.8 mmHg. Aortic valve area, by VTI measures 1.48 cm. Pulmonic Valve: The pulmonic valve was normal in structure. Pulmonic valve regurgitation is not visualized. No evidence of pulmonic stenosis. Aorta: The aortic root is normal in size and structure. Venous: The inferior vena cava is normal in size with greater than 50% respiratory variability, suggesting right atrial pressure of 3 mmHg. IAS/Shunts: No atrial level shunt detected by color flow Doppler.  LEFT VENTRICLE PLAX 2D LVIDd:         4.58 cm LVIDs:         2.91 cm LV PW:  1.33 cm LV IVS:        0.97 cm LVOT diam:     2.00 cm LV SV:         43.67 ml LV SV Index:   36.38 LVOT Area:     3.14 cm  LEFT ATRIUM         Index LA diam:    4.30 cm 2.51 cm/m  AORTIC VALVE                    PULMONIC VALVE AV Area (Vmax):    1.60 cm    PV Vmax:        0.90 m/s AV Area (Vmean):   1.24 cm    PV Peak grad:   3.3 mmHg AV Area (VTI):     1.48 cm    RVOT Peak grad: 3 mmHg AV Vmax:           139.33 cm/s AV Vmean:          96.633 cm/s AV VTI:            0.295 m AV Peak Grad:      7.8 mmHg AV Mean Grad:      4.3 mmHg LVOT Vmax:         70.90 cm/s LVOT Vmean:        38.100 cm/s LVOT VTI:          0.139 m LVOT/AV VTI ratio: 0.47  AORTA Ao Root diam: 2.50 cm MITRAL VALVE               TRICUSPID VALVE MV Area (PHT): 3.60 cm    TR Peak grad:   12.2 mmHg MV Decel Time: 211 msec    TR Vmax:        175.00 cm/s MV E velocity: 72.30 cm/s MV A velocity: 91.20 cm/s  SHUNTS MV E/A ratio:  0.79        Systemic VTI:  0.14 m                            Systemic Diam: 2.00 cm Neoma Laming MD Electronically signed by Neoma Laming MD Signature Date/Time: 10/29/2019/2:19:33 PM    Final        Subjective: Still nauseated, threw up this morning, but energy improving.  Less dizzy.      Discharge Exam: Vitals:   11/09/19 0429 11/09/19 0800  BP: (!) 162/70 (!) 169/75  Pulse: 85 90  Resp: 16 18  Temp: 98.4 F (36.9 C)   SpO2: 100%    Vitals:   11/08/19 1556 11/08/19 2039 11/09/19 0429 11/09/19 0800  BP: (!) 117/56 (!) 156/75 (!) 162/70 (!) 169/75  Pulse: 83 90 85 90  Resp: 18 14 16 18   Temp: 97.9 F (36.6 C) 98.2 F (36.8 C) 98.4 F (36.9 C)   TempSrc: Oral Oral Oral   SpO2: 99%  100%   Weight:      Height:        General: Pt is alert, awake, not in acute distress, lying in bed Cardiovascular: RRR, nl S1-S2, no murmurs appreciated.   No LE edema.   Respiratory: Normal respiratory rate and rhythm.  CTAB without rales or wheezes. Abdominal: Abdomen soft and non-tender.  No distension or HSM.   Neuro/Psych: Strength symmetric in upper and lower extremities.  Judgment and insight appear normal.   The results of significant diagnostics from this hospitalization (including imaging, microbiology,  ancillary and laboratory) are listed below for reference.     Microbiology: No results found for this or any previous visit (from the past 240 hour(s)).   Labs: BNP (last 3 results) No results for input(s): BNP in the last 8760 hours. Basic Metabolic Panel: Recent Labs  Lab 11/03/19 1736 11/04/19 0617 11/05/19 0415 11/07/19 0422 11/08/19 0334  NA 135 134* 134* 132* 133*  K 4.3 4.5 4.6 3.9 3.8  CL 92* 93* 90* 94* 93*  CO2 23 20* 20* 21* 21*  GLUCOSE 68* 102* 344* 275* 210*  BUN 100* 98* 108* 106* 114*  CREATININE 15.21* 15.24* 14.50* 12.97* 13.84*  CALCIUM 6.8* 6.0* 6.6* 6.8* 6.8*  PHOS  --   --   --  6.0* 7.1*   Liver Function Tests: Recent Labs  Lab 11/03/19 1736 11/07/19 0422 11/08/19 0334  AST 21  --   --   ALT 17  --   --   ALKPHOS 44  --   --   BILITOT 0.8  --   --   PROT 6.0*  --   --   ALBUMIN 2.5* 2.1* 2.1*   No results for input(s): LIPASE, AMYLASE in the last 168 hours. No results for input(s): AMMONIA in the last 168 hours. CBC: Recent Labs  Lab 11/03/19 1736 11/04/19 0617 11/05/19 0415 11/07/19 0422 11/08/19 0334  WBC 11.6* 10.3 10.6* 16.5* 12.3*  NEUTROABS 9.1*  --   --   --   --   HGB 11.9* 11.1* 11.8* 10.1* 10.1*  HCT 37.5 34.4* 35.7* 31.8* 31.7*  MCV 91.7 90.3 89.0 91.4 91.4  PLT 285 252 264 253 227   Cardiac Enzymes: No results for input(s): CKTOTAL, CKMB, CKMBINDEX, TROPONINI in the last 168 hours. BNP: Invalid input(s): POCBNP CBG: Recent Labs  Lab 11/08/19 1104 11/08/19 1639 11/08/19 2056 11/09/19 0735 11/09/19 1111  GLUCAP 131* 96 138* 155* 140*   D-Dimer No results for input(s): DDIMER in the last 72 hours. Hgb A1c No results for input(s): HGBA1C in the last 72 hours. Lipid Profile No results for input(s): CHOL, HDL, LDLCALC, TRIG, CHOLHDL, LDLDIRECT in the last 72 hours. Thyroid function studies No results for input(s): TSH, T4TOTAL, T3FREE, THYROIDAB in the last 72 hours.  Invalid input(s): FREET3 Anemia work  up No results for input(s): VITAMINB12, FOLATE, FERRITIN, TIBC, IRON, RETICCTPCT in the last 72 hours. Urinalysis    Component Value Date/Time   COLORURINE YELLOW (A) 10/31/2017 1959   APPEARANCEUR HAZY (A) 10/31/2017 1959   APPEARANCEUR Hazy 12/09/2013 1046   LABSPEC 1.017 10/31/2017 1959   LABSPEC 1.031 12/09/2013 1046   PHURINE 6.0 10/31/2017 1959   GLUCOSEU >=500 (A) 10/31/2017 1959   GLUCOSEU >=500 12/09/2013 1046   HGBUR NEGATIVE 10/31/2017 [place] NEGATIVE 10/31/2017 1959   BILIRUBINUR Negative 12/09/2013 1046   [place] II 10/31/2017 1959   PROTEINUR >=300 (A) 10/31/2017 1959   NITRITE NEGATIVE 10/31/2017 1959   LEUKOCYTESUR NEGATIVE 10/31/2017 1959   LEUKOCYTESUR Negative 12/09/2013 1046   Sepsis Labs Invalid input(s): PROCALCITONIN,  WBC,  LACTICIDVEN Microbiology No results found for this or any previous visit (from the past 240 hour(s)).   Time coordinating discharge: 25 minutes     SIGNED:   Edwin Dada, MD  [facility] 11/09/2019, 3:00 PM

## 2019-11-09 NOTE — Progress Notes (Signed)
Tracy Jennings  MRN: [id number]  DOB/AGE: 1956/04/11 64 y.o.  Primary Care Physician:White, Orlene Och, NP  Admit date: 11/03/2019  Chief Complaint:  Chief Complaint  Patient presents with  . Generalized Weakness    S-Pt presented on  11/03/2019 with  Chief Complaint  Patient presents with  . Generalized Weakness  .    Pt today feels better.  Patient offers no new specific complaints  Medications . carvedilol  6.25 mg Oral BID WC  . clopidogrel  75 mg Oral Daily  . gentamicin cream  1 application Topical Daily  . insulin aspart  0-15 Units Subcutaneous TID WC  . insulin aspart  0-5 Units Subcutaneous QHS  . insulin aspart  3 Units Subcutaneous TID WC  . insulin detemir  10 Units Subcutaneous Daily  . irbesartan  300 mg Oral Daily  . Melatonin  5 mg Oral QHS  . multivitamin  1 tablet Oral QHS  . senna-docusate  2 tablet Oral BID         [id number] from the symptoms mentioned above,there are no other symptoms referable to all systems reviewed.  Physical Exam: Vital signs in last 24 hours: Temp:  [97.9 F (36.6 C)-98.4 F (36.9 C)] 98.4 F (36.9 C) (02/27 0429) Pulse Rate:  [83-90] 90 (02/27 0800) Resp:  [14-18] 18 (02/27 0800) BP: (117-169)/(56-75) 169/75 (02/27 0800) SpO2:  [99 %-100 %] 100 % (02/27 0429) Weight change:  Last BM Date: 11/07/19  Intake/Output from previous day: No intake/output data recorded. Total I/O In: 120 [P.O.:120] Out: 248 [Other:248]   Physical Exam: General- pt is awake,alert, oriented to time place and person Resp- No acute REsp distress, CTA B/L NO Rhonchi CVS- S1S2 regular in rate and rhythm GIT- BS+, soft, NT, ND EXT- NO LE Edema, Cyanosis Access patient has PD catheter in situ  Lab Results: CBC Recent Labs    11/07/19 0422 11/08/19 0334  WBC 16.5* 12.3*  HGB 10.1* 10.1*  HCT 31.8* 31.7*  PLT 253 227    BMET Recent Labs    11/07/19 0422 11/08/19 0334  NA 132* 133*  K 3.9 3.8  CL 94* 93*  CO2 21*  21*  GLUCOSE 275* 210*  BUN 106* 114*  CREATININE 12.97* 13.84*  CALCIUM 6.8* 6.8*    MICRO No results found for this or any previous visit (from the past 240 hour(s)).    Lab Results  Component Value Date   CALCIUM 6.8 (L) 11/08/2019   PHOS 7.1 (H) 11/08/2019               Impression:  Tracy Jennings is a 64 year old African-American female  with end stage renal disease on peritoneal dialysis, hypertension, peripheral vascular disease, hyperlipidemia, diabetes mellitus type II who has been readmitted to [facility] on 11/03/2019 for Orthostatic hypotension , ESRD (end stage renal disease),Generalized weakness,Recurrent syncope and COVID-19.   1)Renal ESRD Patient is on peritoneal dialysis Patient has a fill volume of 2.5 L of 1.5% Patient undergoes 4 cycles of CCPD Patient total dwell time was 10 hours. Patient was dialyzed last night. Patient tolerated treatment well   2)HTN Patient blood pressure is currently at goal Patient hypertension is complicated with orthostatic hypotension Patient medications have been changed-clonidine/prazosin/furosemide/losartan are currently on hold  Patient is currently on Coreg 6.25 mg p.o. twice daily Irbesartan 300 mg p.o. daily   3)Anemia of chronic disease  HGb at goal (9--11)   4) secondary hyperparathyroidism -CKD Mineral-Bone Disorder   Secondary Hyperparathyroidism  Present  Phosphorus not at goal. We will start patient back on binders  5) COVID-19 Patient is clinically better  6) electrolytes   sodium Hyponatremic Most likely secondary to ESRD Stable   potassium Normokalemic    7)Acid base Co2 just at goal     Plan:   We will continue current treatment plan If patient is still inpatient will keep patient on peritoneal dialysis    Tracy Jennings s [facility] 11/09/2019, 9:16 AM

## 2019-11-09 NOTE — Discharge Instructions (Signed)
COVID-19 COVID-19 is a respiratory infection that is caused by a virus called severe acute respiratory syndrome coronavirus 2 (SARS-CoV-2). The disease is also known as coronavirus disease or novel coronavirus. In some people, the virus may not cause any symptoms. In others, it may cause a serious infection. The infection can get worse quickly and can lead to complications, such as:  Pneumonia, or infection of the lungs.  Acute respiratory distress syndrome or ARDS. This is a condition in which fluid build-up in the lungs prevents the lungs from filling with air and passing oxygen into the blood.  Acute respiratory failure. This is a condition in which there is not enough oxygen passing from the lungs to the body or when carbon dioxide is not passing from the lungs out of the body.  Sepsis or septic shock. This is a serious bodily reaction to an infection.  Blood clotting problems.  Secondary infections due to bacteria or fungus.  Organ failure. This is when your body's organs stop working. The virus that causes COVID-19 is contagious. This means that it can spread from person to person through droplets from coughs and sneezes (respiratory secretions). What are the causes? This illness is caused by a virus. You may catch the virus by:  Breathing in droplets from an infected person. Droplets can be spread by a person breathing, speaking, singing, coughing, or sneezing.  Touching something, like a table or a doorknob, that was exposed to the virus (contaminated) and then touching your mouth, nose, or eyes. What increases the risk? Risk for infection You are more likely to be infected with this virus if you:  Are within 6 feet (2 meters) of a person with COVID-19.  Provide care for or live with a person who is infected with COVID-19.  Spend time in crowded indoor spaces or live in shared housing. Risk for serious illness You are more likely to become seriously ill from the virus if  you:  Are 50 years of age or older. The higher your age, the more you are at risk for serious illness.  Live in a nursing home or long-term care facility.  Have cancer.  Have a long-term (chronic) disease such as: ? Chronic lung disease, including chronic obstructive pulmonary disease or asthma. ? A long-term disease that lowers your body's ability to fight infection (immunocompromised). ? Heart disease, including heart failure, a condition in which the arteries that lead to the heart become narrow or blocked (coronary artery disease), a disease which makes the heart muscle thick, weak, or stiff (cardiomyopathy). ? Diabetes. ? Chronic kidney disease. ? Sickle cell disease, a condition in which red blood cells have an abnormal "sickle" shape. ? Liver disease.  Are obese. What are the signs or symptoms? Symptoms of this condition can range from mild to severe. Symptoms may appear any time from 2 to 14 days after being exposed to the virus. They include:  A fever or chills.  A cough.  Difficulty breathing.  Headaches, body aches, or muscle aches.  Runny or stuffy (congested) nose.  A sore throat.  New loss of taste or smell. Some people may also have stomach problems, such as nausea, vomiting, or diarrhea. Other people may not have any symptoms of COVID-19. How is this diagnosed? This condition may be diagnosed based on:  Your signs and symptoms, especially if: ? You live in an area with a COVID-19 outbreak. ? You recently traveled to or from an area where the virus is common. ? You   provide care for or live with a person who was diagnosed with COVID-19. ? You were exposed to a person who was diagnosed with COVID-19.  A physical exam.  Lab tests, which may include: ? Taking a sample of fluid from the back of your nose and throat (nasopharyngeal fluid), your nose, or your throat using a swab. ? A sample of mucus from your lungs (sputum). ? Blood tests.  Imaging tests,  which may include, X-rays, CT scan, or ultrasound. How is this treated? At present, there is no medicine to treat COVID-19. Medicines that treat other diseases are being used on a trial basis to see if they are effective against COVID-19. Your health care provider will talk with you about ways to treat your symptoms. For most people, the infection is mild and can be managed at home with rest, fluids, and over-the-counter medicines. Treatment for a serious infection usually takes places in a hospital intensive care unit (ICU). It may include one or more of the following treatments. These treatments are given until your symptoms improve.  Receiving fluids and medicines through an IV.  Supplemental oxygen. Extra oxygen is given through a tube in the nose, a face mask, or a hood.  Positioning you to lie on your stomach (prone position). This makes it easier for oxygen to get into the lungs.  Continuous positive airway pressure (CPAP) or bi-level positive airway pressure (BPAP) machine. This treatment uses mild air pressure to keep the airways open. A tube that is connected to a motor delivers oxygen to the body.  Ventilator. This treatment moves air into and out of the lungs by using a tube that is placed in your windpipe.  Tracheostomy. This is a procedure to create a hole in the neck so that a breathing tube can be inserted.  Extracorporeal membrane oxygenation (ECMO). This procedure gives the lungs a chance to recover by taking over the functions of the heart and lungs. It supplies oxygen to the body and removes carbon dioxide. Follow these instructions at home: Lifestyle  If you are sick, stay home except to get medical care. Your health care provider will tell you how long to stay home. Call your health care provider before you go for medical care.  Rest at home as told by your health care provider.  Do not use any products that contain nicotine or tobacco, such as cigarettes,  e-cigarettes, and chewing tobacco. If you need help quitting, ask your health care provider.  Return to your normal activities as told by your health care provider. Ask your health care provider what activities are safe for you. General instructions  Take over-the-counter and prescription medicines only as told by your health care provider.  Drink enough fluid to keep your urine pale yellow.  Keep all follow-up visits as told by your health care provider. This is important. How is this prevented?  There is no vaccine to help prevent COVID-19 infection. However, there are steps you can take to protect yourself and others from this virus. To protect yourself:   Do not travel to areas where COVID-19 is a risk. The areas where COVID-19 is reported change often. To identify high-risk areas and travel restrictions, check the CDC travel website: wwwnc.cdc.gov/travel/notices  If you live in, or must travel to, an area where COVID-19 is a risk, take precautions to avoid infection. ? Stay away from people who are sick. ? Wash your hands often with soap and water for 20 seconds. If soap and water   are not available, use an alcohol-based hand sanitizer. ? Avoid touching your mouth, face, eyes, or nose. ? Avoid going out in public, follow guidance from your state and local health authorities. ? If you must go out in public, wear a cloth face covering or face mask. Make sure your mask covers your nose and mouth. ? Avoid crowded indoor spaces. Stay at least 6 feet (2 meters) away from others. ? Disinfect objects and surfaces that are frequently touched every day. This may include:  Counters and tables.  Doorknobs and light switches.  Sinks and faucets.  Electronics, such as phones, remote controls, keyboards, computers, and tablets. To protect others: If you have symptoms of COVID-19, take steps to prevent the virus from spreading to others.  If you think you have a COVID-19 infection, contact  your health care provider right away. Tell your health care team that you think you may have a COVID-19 infection.  Stay home. Leave your house only to seek medical care. Do not use public transport.  Do not travel while you are sick.  Wash your hands often with soap and water for 20 seconds. If soap and water are not available, use alcohol-based hand sanitizer.  Stay away from other members of your household. Let healthy household members care for children and pets, if possible. If you have to care for children or pets, wash your hands often and wear a mask. If possible, stay in your own room, separate from others. Use a different bathroom.  Make sure that all people in your household wash their hands well and often.  Cough or sneeze into a tissue or your sleeve or elbow. Do not cough or sneeze into your hand or into the air.  Wear a cloth face covering or face mask. Make sure your mask covers your nose and mouth. Where to find more information  Centers for Disease Control and Prevention: www.cdc.gov/coronavirus/2019-ncov/index.html  World Health Organization: www.who.int/health-topics/coronavirus Contact a health care provider if:  You live in or have traveled to an area where COVID-19 is a risk and you have symptoms of the infection.  You have had contact with someone who has COVID-19 and you have symptoms of the infection. Get help right away if:  You have trouble breathing.  You have pain or pressure in your chest.  You have confusion.  You have bluish lips and fingernails.  You have difficulty waking from sleep.  You have symptoms that get worse. These symptoms may represent a serious problem that is an emergency. Do not wait to see if the symptoms will go away. Get medical help right away. Call your local emergency services (911 in the [place] Do not drive yourself to the hospital. Let the emergency medical personnel know if you think you have  COVID-19. Summary  COVID-19 is a respiratory infection that is caused by a virus. It is also known as coronavirus disease or novel coronavirus. It can cause serious infections, such as pneumonia, acute respiratory distress syndrome, acute respiratory failure, or sepsis.  The virus that causes COVID-19 is contagious. This means that it can spread from person to person through droplets from breathing, speaking, singing, coughing, or sneezing.  You are more likely to develop a serious illness if you are 50 years of age or older, have a weak immune system, live in a nursing home, or have chronic disease.  There is no medicine to treat COVID-19. Your health care provider will talk with you about ways to treat your symptoms.    Take steps to protect yourself and others from infection. Wash your hands often and disinfect objects and surfaces that are frequently touched every day. Stay away from people who are sick and wear a mask if you are sick. This information is not intended to replace advice given to you by your health care provider. Make sure you discuss any questions you have with your health care provider. Document Revised: 06/28/2019 Document Reviewed: 10/04/2018 Elsevier Patient Education  2020 [employer] 10 Things You Can Do to Manage Your COVID-19 Symptoms at Home If you have possible or confirmed COVID-19: 1. Stay home from work and school. And stay away from other public places. If you must go out, avoid using any kind of public transportation, ridesharing, or taxis. 2. Monitor your symptoms carefully. If your symptoms get worse, call your healthcare provider immediately. 3. Get rest and stay hydrated. 4. If you have a medical appointment, call the healthcare provider ahead of time and tell them that you have or may have COVID-19. 5. For medical emergencies, call 911 and notify the dispatch personnel that you have or may have COVID-19. 6. Cover your cough and sneezes with a tissue or use the inside of your elbow. 7.  Wash your hands often with soap and water for at least 20 seconds or clean your hands with an alcohol-based hand sanitizer that contains at least 60% alcohol. 8. As much as possible, stay in a specific room and away from other people in your home. Also, you should use a separate bathroom, if available. If you need to be around other people in or outside of the home, wear a mask. 9. Avoid sharing personal items with other people in your household, like dishes, towels, and bedding. 10. Clean all surfaces that are touched often, like counters, tabletops, and doorknobs. Use household cleaning sprays or wipes according to the label instructions. cdc.gov/coronavirus 03/13/2019 This information is not intended to replace advice given to you by your health care provider. Make sure you discuss any questions you have with your health care provider. Document Revised: 08/15/2019 Document Reviewed: 08/15/2019 Elsevier Patient Education  2020 [employer] Inc[employer]  

## 2019-11-11 ENCOUNTER — Other Ambulatory Visit: Payer: Self-pay | Admitting: Internal Medicine

## 2019-11-11 DIAGNOSIS — U071 COVID-19: Secondary | ICD-10-CM

## 2019-11-27 ENCOUNTER — Other Ambulatory Visit: Payer: Self-pay | Admitting: Internal Medicine

## 2020-03-10 ENCOUNTER — Encounter: Payer: Self-pay | Admitting: *Deleted

## 2020-03-10 ENCOUNTER — Telehealth: Payer: Self-pay | Admitting: *Deleted

## 2020-03-10 NOTE — Telephone Encounter (Signed)
Spoke with Dr. Leonides Schanz- GYN regarding this patient's referral to Dr. Fransisca Connors for cervical cancer. Options for apt dates given to Dr. Leonides Schanz for patient. We can see the patient at 9 am tomorrow 03/11/2020 or 830 am on 03/18/2020. Dr. Leonides Schanz will call the patient to see her preference and inform her the referral.

## 2020-03-11 ENCOUNTER — Inpatient Hospital Stay: Payer: BC Managed Care – PPO

## 2020-03-11 ENCOUNTER — Inpatient Hospital Stay: Payer: BC Managed Care – PPO | Attending: Obstetrics and Gynecology | Admitting: Obstetrics and Gynecology

## 2020-03-11 ENCOUNTER — Other Ambulatory Visit: Payer: Self-pay

## 2020-03-11 DIAGNOSIS — C530 Malignant neoplasm of endocervix: Secondary | ICD-10-CM

## 2020-03-11 DIAGNOSIS — C539 Malignant neoplasm of cervix uteri, unspecified: Secondary | ICD-10-CM

## 2020-03-11 DIAGNOSIS — Z8616 Personal history of COVID-19: Secondary | ICD-10-CM | POA: Diagnosis not present

## 2020-03-11 DIAGNOSIS — Z7902 Long term (current) use of antithrombotics/antiplatelets: Secondary | ICD-10-CM | POA: Insufficient documentation

## 2020-03-11 DIAGNOSIS — E1136 Type 2 diabetes mellitus with diabetic cataract: Secondary | ICD-10-CM | POA: Insufficient documentation

## 2020-03-11 DIAGNOSIS — E1122 Type 2 diabetes mellitus with diabetic chronic kidney disease: Secondary | ICD-10-CM | POA: Insufficient documentation

## 2020-03-11 DIAGNOSIS — Z79899 Other long term (current) drug therapy: Secondary | ICD-10-CM | POA: Insufficient documentation

## 2020-03-11 DIAGNOSIS — N186 End stage renal disease: Secondary | ICD-10-CM | POA: Insufficient documentation

## 2020-03-11 DIAGNOSIS — I120 Hypertensive chronic kidney disease with stage 5 chronic kidney disease or end stage renal disease: Secondary | ICD-10-CM | POA: Diagnosis not present

## 2020-03-11 DIAGNOSIS — D631 Anemia in chronic kidney disease: Secondary | ICD-10-CM | POA: Insufficient documentation

## 2020-03-11 DIAGNOSIS — Z992 Dependence on renal dialysis: Secondary | ICD-10-CM | POA: Diagnosis not present

## 2020-03-11 DIAGNOSIS — Z7982 Long term (current) use of aspirin: Secondary | ICD-10-CM | POA: Insufficient documentation

## 2020-03-11 LAB — COMPREHENSIVE METABOLIC PANEL
ALT: 16 U/L (ref 0–44)
AST: 19 U/L (ref 15–41)
Albumin: 3.5 g/dL (ref 3.5–5.0)
Alkaline Phosphatase: 69 U/L (ref 38–126)
Anion gap: 16 — ABNORMAL HIGH (ref 5–15)
BUN: 48 mg/dL — ABNORMAL HIGH (ref 8–23)
CO2: 27 mmol/L (ref 22–32)
Calcium: 8.6 mg/dL — ABNORMAL LOW (ref 8.9–10.3)
Chloride: 91 mmol/L — ABNORMAL LOW (ref 98–111)
Creatinine, Ser: 10.57 mg/dL — ABNORMAL HIGH (ref 0.44–1.00)
GFR calc Af Amer: 4 mL/min — ABNORMAL LOW (ref >60)
GFR calc non Af Amer: 3 mL/min — ABNORMAL LOW (ref >60)
Glucose, Bld: 110 mg/dL — ABNORMAL HIGH (ref 70–99)
Potassium: 3.2 mmol/L — ABNORMAL LOW (ref 3.5–5.1)
Sodium: 134 mmol/L — ABNORMAL LOW (ref 135–145)
Total Bilirubin: 0.6 mg/dL (ref 0.3–1.2)
Total Protein: 8.4 g/dL — ABNORMAL HIGH (ref 6.5–8.1)

## 2020-03-11 LAB — CBC WITH DIFFERENTIAL/PLATELET
Abs Immature Granulocytes: 0.04 10*3/uL (ref 0.00–0.07)
Basophils Absolute: 0.1 10*3/uL (ref 0.0–0.1)
Basophils Relative: 2 %
Eosinophils Absolute: 0.3 10*3/uL (ref 0.0–0.5)
Eosinophils Relative: 6 %
HCT: 41.1 % (ref 36.0–46.0)
Hemoglobin: 12.9 g/dL (ref 12.0–15.0)
Immature Granulocytes: 1 %
Lymphocytes Relative: 32 %
Lymphs Abs: 1.8 10*3/uL (ref 0.7–4.0)
MCH: 28.6 pg (ref 26.0–34.0)
MCHC: 31.4 g/dL (ref 30.0–36.0)
MCV: 91.1 fL (ref 80.0–100.0)
Monocytes Absolute: 0.5 10*3/uL (ref 0.1–1.0)
Monocytes Relative: 9 %
Neutro Abs: 2.8 10*3/uL (ref 1.7–7.7)
Neutrophils Relative %: 50 %
Platelets: 304 10*3/uL (ref 150–400)
RBC: 4.51 MIL/uL (ref 3.87–5.11)
RDW: 19.9 % — ABNORMAL HIGH (ref 11.5–15.5)
WBC: 5.5 10*3/uL (ref 4.0–10.5)
nRBC: 0.5 % — ABNORMAL HIGH (ref 0.0–0.2)

## 2020-03-11 NOTE — Progress Notes (Signed)
Patient here today for new evaluation regarding cervical cancer.

## 2020-03-11 NOTE — Progress Notes (Addendum)
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 female with ESRD on peritoneal dialysis who is seen in consultation from Dr Leonides Schanz for cervical adenocarcinoma.  64 y.o. [id number] female seen by Dr Leonides Schanz 6/29 for colposcopy due to ASC-H on pap 12/26/2019.  PAP: Atypical squamous cells cannot exclude HSIL (ASC-H) by: Tracy Holmes, MD.    Prior treatment for cervical/vaginal vulvar dysplasia: no (last PAP normal 4 years ago per patient) HIV positive: no Smoker: no Contraception: postmenopausal   On exam Vagina: Normal, well-estrogenized vaginal mucosa, no evidence of vaginal neoplasia Cervix: No overt carcinoma, well-visualized   Colposcopic exam: TZ not seen entirely, AWE not seen. No lesions seen ECC was done, random cervical biopsies were taken  Pathology 10:00 - suspicious for adenocarcinoma 6:00, ECC - adenocarcinoma  Menopause age 57.  No bleeding since.  No pelvic pain.   UTI a month ago and some vaginal discharge.   On dialysis for about a year and stopped working. Takes iron shots for anemia.  Patient hospitalized three days for mild COVID19 pneumonia in 2/21 2/21 Carotid US 1. Bilateral carotid bifurcation plaque resulting in less than 50% diameter ICA stenosis. 2. Antegrade bilateral vertebral arterial flow.  Cardiac echo EF 60-65%  Problem List: Patient Active Problem List   Diagnosis Date Noted  . Orthostatic hypotension   . Syncope 11/04/2019  . 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 Type 2 diabetes mellitus with other specified complication ([place]) 37/90/2409  . Peritoneal dialysis status ([place]) 11/03/2019  . Unable to care for self 11/03/2019  . Supine hypertension 11/03/2019  . Metabolic acidosis, increased anion gap (IAG)   . COVID-19   . Generalized weakness   . Recurrent syncope 10/28/2019  . 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 Complication of vascular access for dialysis 05/11/2018  . Respiratory arrest ([place]) 05/11/2018  . Hyperlipidemia 04/12/2018  . Pancreatitis, recurrent 11/01/2017  . HTN (hypertension) 11/01/2017  . ESRD (end stage renal disease) ([place]) 11/01/2017  . Recurrent pancreatitis 11/01/2017    Past Medical History: Past Medical History:  Diagnosis Date  . Anemia 04/2018   low iron. to be started on supplements  . Cervical cancer ([place])   . CKD (chronic kidney disease)    Stage IV  . COVID-19 virus detected 10/28/2019  . Diabetes mellitus without complication ([place])    type II  . ESRD (end stage renal disease) ([place])   . Heart murmur    followed as a child only  . HSIL (high grade squamous intraepithelial lesion) on Pap smear of cervix   . Hyperlipidemia associated with type 2 diabetes mellitus ([place])   . Hypertension   . Pancreatitis   . Peripheral vascular disease [facility])     Past Surgical History: Past Surgical History:  Procedure Laterality Date  . AMPUTATION TOE Left 2013   2nd toe. tip of toe (toe nail was infected)  . AV FISTULA PLACEMENT Left 05/11/2018   Procedure: ARTERIOVENOUS (AV) FISTULA CREATION;  Surgeon: Katha Cabal, MD;  Location: [facility] ORS;  Service: Vascular;  Laterality: Left;  . CATARACT EXTRACTION    . CHOLECYSTECTOMY  2014  . COLONOSCOPY    . COLONOSCOPY WITH PROPOFOL N/A 01/10/2018   Procedure: COLONOSCOPY WITH PROPOFOL;  Surgeon: Toledo, Benay Pike, MD;  Location: [facility] ENDOSCOPY;  Service: Gastroenterology;  Laterality: N/A;  . DIALYSIS/PERMA CATHETER INSERTION N/A 05/21/2018   Procedure: DIALYSIS/PERMA CATHETER INSERTION;  Surgeon: Algernon Huxley, MD;  Location: [place] CV LAB;  Service: Cardiovascular;  Laterality: N/A;  . DIALYSIS/PERMA CATHETER REMOVAL N/A 04/11/2019   Procedure: DIALYSIS/PERMA CATHETER REMOVAL;  Surgeon: Algernon Huxley, MD;  Location: [place] CV LAB;  Service: Cardiovascular;  Laterality: N/A;   . EYE SURGERY Bilateral 2018   cataract extractions  . THROMBECTOMY W/ EMBOLECTOMY  05/11/2018   Procedure: THROMBECTOMY ARTERIOVENOUS FISTULA;  Surgeon: Katha Cabal, MD;  Location: [facility] ORS;  Service: Vascular;;  . TUBAL LIGATION  1984   Family History: Family History  Problem Relation Age of Onset  . Stroke Mother   . Hypertension Mother   . Gout Mother   . Cancer Father   . Diabetes Sister   . Diabetes Maternal Grandmother   . Diabetes Maternal Grandfather   . Diabetes Paternal Grandmother   . Diabetes Paternal Grandfather     Social History: Social History   Socioeconomic History  . Marital status: Married    Spouse name: clarence  . Number of children: Not on file  . Years of education: Not on file  . Highest education level: Not on file  Occupational History    Comment: works nights  Tobacco Use  . Smoking status: Never Smoker  . Smokeless tobacco: Never Used  Vaping Use  . Vaping Use: Never used  Substance and Sexual Activity  . Alcohol use: No  . Drug use: Never  . Sexual activity: Not Currently  Other Topics Concern  . Not on file  Social History Narrative  . Not on file   Social Determinants of Health   Financial Resource Strain:   . Difficulty of Paying Living Expenses:   Food Insecurity:   . Worried About Charity fundraiser in the Last Year:   . Arboriculturist in the Last Year:   Transportation Needs:   . Film/video editor (Medical):   Marland Kitchen Lack of Transportation (Non-Medical):   Physical Activity:   . Days of Exercise per Week:   . Minutes of Exercise per Session:   Stress:   . Feeling of Stress :   Social Connections:   . Frequency of Communication with Friends and Family:   . Frequency of Social Gatherings with Friends and Family:   . Attends Religious Services:   . Active Member of Clubs or Organizations:   . Attends Archivist Meetings:   Marland Kitchen Marital Status:   Intimate Partner Violence:   . Fear of Current or  Ex-Partner:   . Emotionally Abused:   Marland Kitchen Physically Abused:   . Sexually Abused:     Allergies: Allergies  Allergen Reactions  . Amlodipine Swelling    Knees down to ankles Knees down to ankles  . Hctz [Hydrochlorothiazide]     pancreatitis  . Heparin Other (See Comments)    Pt reports cardiac arrest when given heparin  . Lmw Heparin     Reports cardiac arrest when given heparin  . Other Other (See Comments)    Reports cardiac arrest when given during surgery Reports cardiac arrest when given during surgery Reports cardiac arrest when given during surgery   . Amlodipine Besylate Swelling  . Sulfa Antibiotics Rash  . Dairy Aid [Lactase]     Runny nose  . Hydralazine Nausea Only    Current Medications: Current Outpatient Medications  Medication Sig Dispense Refill  . aspirin EC 81 MG tablet Take 81 mg by mouth daily.    . calcitRIOL (ROCALTROL) 0.25  MCG capsule Take 0.25 mcg by mouth daily.    . calcium acetate (PHOSLO) 667 MG capsule Take 1,334 mg by mouth 3 (three) times daily.    . carvedilol (COREG) 25 MG tablet Take 25 mg by mouth 2 (two) times daily with a meal.    . clopidogrel (PLAVIX) 75 MG tablet Take 75 mg by mouth daily.    . furosemide (LASIX) 80 MG tablet Take 1 tablet (80 mg total) by mouth daily. 30 tablet 3  . gentamicin cream (GARAMYCIN) 0.1 % Apply 1 application topically 3 (three) times daily.    . ondansetron (ZOFRAN) 4 MG tablet Take 1 tablet (4 mg total) by mouth every 6 (six) hours as needed for nausea. 30 tablet 1  . Vitamin D, Ergocalciferol, (DRISDOL) 50000 units CAPS capsule TAKE ONE CAPSULE BY MOUTH ONE TIME PER WEEK  0  . atorvastatin (LIPITOR) 40 MG tablet Take 40 mg by mouth daily at 6 PM.     . fluticasone (FLONASE) 50 MCG/ACT nasal spray 2 sprays by Each Nare route daily.     No current facility-administered medications for this visit.    Review of Systems General: negative for, fevers, chills, changes in sleep, changes in weight or  appetite Skin: negative for changes in color, texture, moles or lesions Eyes: negative for, changes in vision, pain, diplopia HEENT: negative for, change in hearing, tinnitus, vertigo, voice changes, sore throat, neck masses Pulmonary: negative for, productive cough Cardiac: negative for, palpitations, syncope, pain, discomfort Gastrointestinal: negative for, dysphagia, nausea, vomiting, jaundice, pain, constipation, diarrhea, hematemesis, hematochezia Genitourinary/Sexual: negative for, dysuria, discharge, hesitancy, nocturia, retention, stones, infections, STD's, incontinence Musculoskeletal: negative for, pain Hematology: negative for, easy bruising, bleeding Neurologic/Psych: negative for, seizures, paralysis, tremor, change in sensation, mood swings, depression, anxiety, change in memory  Objective:  Physical Examination:  BP (!) 223/85   Pulse 82   Temp (!) 96.9 F (36.1 C) (Tympanic)   Resp 20   Ht 5\' 3"  (1.6 m)   Wt 150 lb 8 oz (68.3 kg)   BMI 26.66 kg/m   Repeat BP 200/80   ECOG Performance Status: 2 - Symptomatic, <50% confined to bed  General appearance: alert, cooperative and appears older than stated age HEENT:extra ocular movement intact, sclera clear, anicteric, neck supple with midline trachea and thyroid without masses Lymph node survey: non-palpable, axillary, inguinal, supraclavicular Cardiovascular: regular rate and rhythm, systolic murmur, no clear gallop Respiratory: normal air entry, lungs clear to auscultation and no rales, rhonchi or wheezing Abdomen: soft, non-tender, without masses or organomegaly and no hernias Back: inspection of back is normal Extremities: extremities normal, atraumatic, no cyanosis or edema Skin exam - normal coloration and turgor, no rashes, no suspicious skin lesions noted. Neurological exam reveals alert, oriented, normal speech, no focal findings or movement disorder noted.  Pelvic: exam chaperoned by nurse;  Vulva: normal  appearing vulva with no masses, tenderness or lesions; Vagina: normal vagina; Adnexa: normal adnexa in size, nontender and no masses; Uterus: uterus is normal size, shape, consistency and nontender; Cervix: anteverted, no lesions or nodularity; Rectal: normal rectal, no masses  Lab Review  CMP Latest Ref Rng & Units 11/08/2019 11/07/2019 11/05/2019  Glucose 70 - 99 mg/dL 210(H) 275(H) 344(H)  BUN 8 - 23 mg/dL 114(H) 106(H) 108(H)  Creatinine 0.44 - 1.00 mg/dL 13.84(H) 12.97(H) 14.50(H)  Sodium 135 - 145 mmol/L 133(L) 132(L) 134(L)  Potassium 3.5 - 5.1 mmol/L 3.8 3.9 4.6  Chloride 98 - 111 mmol/L 93(L) 94(L) 90(L)  CO2  22 - 32 mmol/L 21(L) 21(L) 20(L)  Calcium 8.9 - 10.3 mg/dL 6.8(L) 6.8(L) 6.6(L)  Total Protein 6.5 - 8.1 g/dL - - -  Total Bilirubin 0.3 - 1.2 mg/dL - - -  Alkaline Phos 38 - 126 U/L - - -  AST 15 - 41 U/L - - -  ALT 0 - 44 U/L - - -      Chemistry      Component Value Date/Time   NA 133 (L) 11/08/2019 0334   NA 139 06/14/2014 0543   K 3.8 11/08/2019 0334   K 3.6 06/14/2014 0543   CL 93 (L) 11/08/2019 0334   CL 105 06/14/2014 0543   CO2 21 (L) 11/08/2019 0334   CO2 29 06/14/2014 0543   BUN 114 (H) 11/08/2019 0334   BUN 21 (H) 06/14/2014 0543   CREATININE 13.84 (H) 11/08/2019 0334   CREATININE 1.04 06/14/2014 0543      Component Value Date/Time   CALCIUM 6.8 (L) 11/08/2019 0334   CALCIUM 8.4 (L) 06/14/2014 0543   ALKPHOS 44 11/03/2019 1736   ALKPHOS 110 06/12/2014 2134   AST 21 11/03/2019 1736   AST 66 (H) 06/12/2014 2134   ALT 17 11/03/2019 1736   ALT 68 (H) 06/12/2014 2134   BILITOT 0.8 11/03/2019 1736   BILITOT 0.4 06/12/2014 2134     Lab Results  Component Value Date   WBC 12.3 (H) 11/08/2019   HGB 10.1 (L) 11/08/2019   HCT 31.7 (L) 11/08/2019   MCV 91.4 11/08/2019   PLT 227 11/08/2019       Assessment:  Tracy Jennings is a 64 y.o. female diagnosed with adenocarcinoma (no grade assigned) involving the cervix, unclear if cervical or uterine  primary, but suspect cervical as this was detected by PAP and she is thin and has not had any bleeding.  ESRD on peritoneal dialysis for a year and being considered for transplant. Renal failure associated anemia getting IV iron.  Systolic murmer likely due to anemia with normal ECHO.   Medical co-morbidities complicating care: AODM (not on meds now, but BS high 6/31), HTN (systolic high today), ESRD on dialysis, carotid stenosis on Plavix and ASA  Plan:   Problem List Items Addressed This Visit      Genitourinary   Cervical cancer ([place])     We discussed options for management including PET/CT to assess extent of disease and this also will help determine cervical versus uterine origin.  She is a poor radical hysterectomy candidate due to ESRD, so if cervical primary would recommend chemoradiation.  If endometrial primary could consider surgery with simple hysterectomy and pelvic radiation.   Will check CMP and CBC.  Not on any medications for diabetes presently, and glucose levels were often over 200 when she was in house in 2/21 for [place].  If BS too high will not be able to have PET scan.   Will have her follow up with PCP regarding high BP. She said she did not take her medications this morning.    The patient's diagnosis, an outline of the further diagnostic and laboratory studies which will be required, the recommendation, and alternatives were discussed.  All questions were answered to the patient's satisfaction.  A total of 60 minutes were spent with the patient/family today; 60% was spent in education, counseling and coordination of care for cervical cancer.    Mellody Drown, MD  CC:  Ricardo Jericho, [place] [address] [place],  [place] [address] [phone]

## 2020-03-13 ENCOUNTER — Inpatient Hospital Stay: Payer: BC Managed Care – PPO | Attending: Oncology

## 2020-03-13 ENCOUNTER — Other Ambulatory Visit: Payer: Self-pay

## 2020-03-13 ENCOUNTER — Ambulatory Visit
Admission: RE | Admit: 2020-03-13 | Discharge: 2020-03-13 | Disposition: A | Discharge status: Home / Self Care | Payer: BC Managed Care – PPO | Source: Ambulatory Visit | Attending: Obstetrics and Gynecology | Admitting: Obstetrics and Gynecology

## 2020-03-13 DIAGNOSIS — N186 End stage renal disease: Secondary | ICD-10-CM | POA: Insufficient documentation

## 2020-03-13 DIAGNOSIS — E1122 Type 2 diabetes mellitus with diabetic chronic kidney disease: Secondary | ICD-10-CM | POA: Diagnosis not present

## 2020-03-13 DIAGNOSIS — C530 Malignant neoplasm of endocervix: Secondary | ICD-10-CM

## 2020-03-13 DIAGNOSIS — Z992 Dependence on renal dialysis: Secondary | ICD-10-CM | POA: Insufficient documentation

## 2020-03-13 DIAGNOSIS — C539 Malignant neoplasm of cervix uteri, unspecified: Secondary | ICD-10-CM | POA: Diagnosis present

## 2020-03-13 DIAGNOSIS — I120 Hypertensive chronic kidney disease with stage 5 chronic kidney disease or end stage renal disease: Secondary | ICD-10-CM | POA: Insufficient documentation

## 2020-03-13 DIAGNOSIS — R590 Localized enlarged lymph nodes: Secondary | ICD-10-CM | POA: Insufficient documentation

## 2020-03-13 DIAGNOSIS — Z8659 Personal history of other mental and behavioral disorders: Secondary | ICD-10-CM

## 2020-03-13 LAB — GLUCOSE, CAPILLARY
Glucose-Capillary: 104 mg/dL — ABNORMAL HIGH (ref 70–99)
Glucose-Capillary: 82 mg/dL (ref 70–99)

## 2020-03-13 MED ORDER — DIAZEPAM 5 MG PO TABS
5.0000 mg | ORAL_TABLET | Freq: Once | ORAL | Status: AC
  Administered 2020-03-13: 5 mg via ORAL

## 2020-03-13 MED ORDER — FLUDEOXYGLUCOSE F - 18 (FDG) INJECTION
8.1090 | Freq: Once | INTRAVENOUS | Status: AC | PRN
  Administered 2020-03-13: 8.109 via INTRAVENOUS

## 2020-03-13 NOTE — Progress Notes (Signed)
Per Sonia Baller, NP. Patient needs valium 5 mg now prior to her pet scan. Sonia Baller spoke with Judson Roch in PET/CT. Pt given the valium and then escorted back to PET/CT.

## 2020-03-18 ENCOUNTER — Other Ambulatory Visit: Payer: Self-pay | Admitting: *Deleted

## 2020-03-18 ENCOUNTER — Telehealth: Payer: Self-pay | Admitting: *Deleted

## 2020-03-18 DIAGNOSIS — C530 Malignant neoplasm of endocervix: Secondary | ICD-10-CM

## 2020-03-18 NOTE — Telephone Encounter (Signed)
Call placed to patient to inform her of new appointments for this week. Patient scheduled to for Friday 7/9 at 9:00 to see Dr. Baruch Gouty, 9:45 to see Dr. Grayland Ormond. Patient verbalized understanding of all appointment details.

## 2020-03-19 ENCOUNTER — Encounter: Payer: Self-pay | Admitting: Oncology

## 2020-03-20 ENCOUNTER — Ambulatory Visit
Admission: RE | Admit: 2020-03-20 | Discharge: 2020-03-20 | Disposition: A | Discharge status: Home / Self Care | Payer: BC Managed Care – PPO | Source: Ambulatory Visit | Attending: Radiation Oncology | Admitting: Radiation Oncology

## 2020-03-20 ENCOUNTER — Other Ambulatory Visit: Payer: Self-pay

## 2020-03-20 ENCOUNTER — Inpatient Hospital Stay (HOSPITAL_BASED_OUTPATIENT_CLINIC_OR_DEPARTMENT_OTHER): Payer: BC Managed Care – PPO | Admitting: Oncology

## 2020-03-20 VITALS — BP 209/87 | HR 81 | Temp 97.8°F | Resp 20 | Wt 154.9 lb

## 2020-03-20 VITALS — BP 204/102 | HR 83 | Temp 97.0°F | Resp 16 | Wt 154.1 lb

## 2020-03-20 DIAGNOSIS — I120 Hypertensive chronic kidney disease with stage 5 chronic kidney disease or end stage renal disease: Secondary | ICD-10-CM

## 2020-03-20 DIAGNOSIS — Z992 Dependence on renal dialysis: Secondary | ICD-10-CM

## 2020-03-20 DIAGNOSIS — E1122 Type 2 diabetes mellitus with diabetic chronic kidney disease: Secondary | ICD-10-CM

## 2020-03-20 DIAGNOSIS — N186 End stage renal disease: Secondary | ICD-10-CM | POA: Insufficient documentation

## 2020-03-20 DIAGNOSIS — I129 Hypertensive chronic kidney disease with stage 1 through stage 4 chronic kidney disease, or unspecified chronic kidney disease: Secondary | ICD-10-CM | POA: Insufficient documentation

## 2020-03-20 DIAGNOSIS — R011 Cardiac murmur, unspecified: Secondary | ICD-10-CM | POA: Diagnosis not present

## 2020-03-20 DIAGNOSIS — R590 Localized enlarged lymph nodes: Secondary | ICD-10-CM

## 2020-03-20 DIAGNOSIS — E785 Hyperlipidemia, unspecified: Secondary | ICD-10-CM | POA: Diagnosis not present

## 2020-03-20 DIAGNOSIS — C539 Malignant neoplasm of cervix uteri, unspecified: Secondary | ICD-10-CM

## 2020-03-20 DIAGNOSIS — C530 Malignant neoplasm of endocervix: Secondary | ICD-10-CM

## 2020-03-20 DIAGNOSIS — Z79899 Other long term (current) drug therapy: Secondary | ICD-10-CM | POA: Diagnosis not present

## 2020-03-20 DIAGNOSIS — E1136 Type 2 diabetes mellitus with diabetic cataract: Secondary | ICD-10-CM | POA: Insufficient documentation

## 2020-03-20 DIAGNOSIS — Z8616 Personal history of COVID-19: Secondary | ICD-10-CM | POA: Diagnosis not present

## 2020-03-20 DIAGNOSIS — Z7189 Other specified counseling: Secondary | ICD-10-CM

## 2020-03-20 DIAGNOSIS — Z7982 Long term (current) use of aspirin: Secondary | ICD-10-CM | POA: Diagnosis not present

## 2020-03-20 MED ORDER — ONDANSETRON HCL 8 MG PO TABS: 8.0000 mg | ORAL_TABLET | Freq: Two times a day (BID) | ORAL | 1 refills | Status: DC | PRN

## 2020-03-20 MED ORDER — PROCHLORPERAZINE MALEATE 10 MG PO TABS: 10.0000 mg | ORAL_TABLET | Freq: Four times a day (QID) | ORAL | 1 refills | Status: DC | PRN

## 2020-03-20 NOTE — Progress Notes (Signed)
START OFF PATHWAY REGIMEN - Other   [id number]:Cisplatin 40 mg/m2 IV D1 q7 Days + RT:   A cycle is every 7 days:     Cisplatin   **Always confirm dose/schedule in your pharmacy ordering system**  Patient Characteristics: Intent of Therapy: Curative Intent, Discussed with Patient 

## 2020-03-20 NOTE — Progress Notes (Signed)
[place]  [phone] [phone]  ID: CLINT STRUPP OB: May 04, 1956  MR#: [id number]  CSN#:[id number]  Patient Care Team: Sharyne Peach, MD as PCP - General (Family Medicine) Noreene Filbert, MD as Radiation Oncologist (Radiation Oncology)  CHIEF COMPLAINT: Cervical cancer  INTERVAL HISTORY: Patient is a 64 year old female who was recently diagnosed with cervical cancer who is on home peritoneal dialysis for end-stage renal disease.  She is anxious, but otherwise feels well.  She has no neurologic complaints.  She denies any recent fevers or illnesses.  She has a good appetite and denies weight loss.  She has no chest pain, shortness of breath, cough, or hemoptysis.  She denies any nausea, vomiting, constipation, or diarrhea.  She has no urinary complaints.  Patient offers no further specific complaints today.  REVIEW OF SYSTEMS:   Review of Systems  Constitutional: Negative.  Negative for fever, malaise/fatigue and weight loss.  Respiratory: Negative.  Negative for cough, hemoptysis and shortness of breath.   Cardiovascular: Negative.  Negative for chest pain and leg swelling.  Gastrointestinal: Negative.  Negative for abdominal pain.  Genitourinary: Negative.  Negative for dysuria.  Musculoskeletal: Negative.  Negative for back pain.  Skin: Negative.  Negative for rash.  Neurological: Negative.  Negative for dizziness, focal weakness, weakness and headaches.  Psychiatric/Behavioral: The patient is nervous/anxious.     As per HPI. Otherwise, a complete review of systems is negative.  PAST MEDICAL HISTORY: Past Medical History:  Diagnosis Date  . Anemia 04/2018   low iron. to be started on supplements  . Cervical cancer ([place])   . CKD (chronic kidney disease)    Stage IV  . COVID-19 virus detected 10/28/2019  . Diabetes mellitus without complication ([place])    type II  . ESRD (end stage renal disease) ([place])   . Heart murmur     followed as a child only  . HSIL (high grade squamous intraepithelial lesion) on Pap smear of cervix   . Hyperlipidemia associated with type 2 diabetes mellitus ([place])   . Hypertension   . Pancreatitis   . Peripheral vascular disease ([place])     PAST SURGICAL HISTORY: Past Surgical History:  Procedure Laterality Date  . AMPUTATION TOE Left 2013   2nd toe. tip of toe (toe nail was infected)  . AV FISTULA PLACEMENT Left 05/11/2018   Procedure: ARTERIOVENOUS (AV) FISTULA CREATION;  Surgeon: Katha Cabal, MD;  Location: [facility];  Service: Vascular;  Laterality: Left;  . CATARACT EXTRACTION    . CHOLECYSTECTOMY  2014  . COLONOSCOPY    . COLONOSCOPY WITH PROPOFOL N/A 01/10/2018   Procedure: COLONOSCOPY WITH PROPOFOL;  Surgeon: Toledo, Benay Pike, MD;  Location: [facility];  Service: Gastroenterology;  Laterality: N/A;  . DIALYSIS/PERMA CATHETER INSERTION N/A 05/21/2018   Procedure: DIALYSIS/PERMA CATHETER INSERTION;  Surgeon: Algernon Huxley, MD;  Location: [place] [facility];  Service: Cardiovascular;  Laterality: N/A;  . DIALYSIS/PERMA CATHETER REMOVAL N/A 04/11/2019   Procedure: DIALYSIS/PERMA CATHETER REMOVAL;  Surgeon: Algernon Huxley, MD;  Location: [place] [facility];  Service: Cardiovascular;  Laterality: N/A;  . EYE SURGERY Bilateral 2018   cataract extractions  . THROMBECTOMY W/ EMBOLECTOMY  05/11/2018   Procedure: THROMBECTOMY ARTERIOVENOUS FISTULA;  Surgeon: Katha Cabal, MD;  Location: [facility];  Service: Vascular;;  . TUBAL LIGATION  1984    FAMILY HISTORY: Family History  Problem Relation Age of Onset  . Stroke Mother   . Hypertension Mother   .  Gout Mother   . Cancer Father   . Diabetes Sister   . Diabetes Maternal Grandmother   . Diabetes Maternal Grandfather   . Diabetes Paternal Grandmother   . Diabetes Paternal Grandfather     ADVANCED DIRECTIVES (Y/N):  N  HEALTH MAINTENANCE: Social History   Tobacco Use  . Smoking status: Never Smoker  .  Smokeless tobacco: Never Used  Vaping Use  . Vaping Use: Never used  Substance Use Topics  . Alcohol use: No  . Drug use: Never     Colonoscopy:  PAP:  Bone density:  Lipid panel:  Allergies  Allergen Reactions  . Amlodipine Swelling    Knees down to ankles Knees down to ankles  . Hctz [Hydrochlorothiazide]     pancreatitis  . Heparin Other (See Comments)    Pt reports cardiac arrest when given heparin  . Lmw Heparin     Reports cardiac arrest when given heparin  . Other Other (See Comments)    Reports cardiac arrest when given during surgery Reports cardiac arrest when given during surgery Reports cardiac arrest when given during surgery   . Amlodipine Besylate Swelling  . Sulfa Antibiotics Rash  . Dairy Aid [Lactase]     Runny nose  . Hydralazine Nausea Only    Current Outpatient Medications  Medication Sig Dispense Refill  . aspirin EC 81 MG tablet Take 81 mg by mouth daily.    . calcitRIOL (ROCALTROL) 0.25 MCG capsule Take 0.25 mcg by mouth daily.    . calcium acetate (PHOSLO) 667 MG capsule Take 1,334 mg by mouth 3 (three) times daily.    . carvedilol (COREG) 25 MG tablet Take 25 mg by mouth 2 (two) times daily with a meal.    . clopidogrel (PLAVIX) 75 MG tablet Take 75 mg by mouth daily.    . furosemide (LASIX) 80 MG tablet Take 1 tablet (80 mg total) by mouth daily. 30 tablet 3  . gentamicin cream (GARAMYCIN) 0.1 % Apply 1 application topically 3 (three) times daily.    . ondansetron (ZOFRAN) 4 MG tablet Take 1 tablet (4 mg total) by mouth every 6 (six) hours as needed for nausea. 30 tablet 1  . Vitamin D, Ergocalciferol, (DRISDOL) 50000 units CAPS capsule TAKE ONE CAPSULE BY MOUTH ONE TIME PER WEEK  0  . ondansetron (ZOFRAN) 8 MG tablet Take 1 tablet (8 mg total) by mouth 2 (two) times daily as needed. 60 tablet 1  . prochlorperazine (COMPAZINE) 10 MG tablet Take 1 tablet (10 mg total) by mouth every 6 (six) hours as needed (Nausea or vomiting). 60 tablet 1    No current facility-administered medications for this visit.    OBJECTIVE: Vitals:   03/19/20 1452  BP: (!) 209/87  Pulse: 81  Resp: 20  Temp: 97.8 F (36.6 C)  SpO2: 100%     Body mass index is 27.44 kg/m.    ECOG FS:0 - Asymptomatic  General: Well-developed, well-nourished, no acute distress. Eyes: Pink conjunctiva, anicteric sclera. HEENT: Normocephalic, moist mucous membranes. Lungs: No audible wheezing or coughing. Heart: Regular rate and rhythm. Abdomen: Soft, nontender, no obvious distention. Musculoskeletal: No edema, cyanosis, or clubbing. Neuro: Alert, answering all questions appropriately. Cranial nerves grossly intact. Skin: No rashes or petechiae noted. Psych: Normal affect. Lymphatics: No cervical, calvicular, axillary or inguinal LAD.   LAB RESULTS:  Lab Results  Component Value Date   NA 134 (L) 03/11/2020   K 3.2 (L) 03/11/2020   CL 91 (L) 03/11/2020  CO2 27 03/11/2020   GLUCOSE 110 (H) 03/11/2020   BUN 48 (H) 03/11/2020   CREATININE 10.57 (H) 03/11/2020   CALCIUM 8.6 (L) 03/11/2020   PROT 8.4 (H) 03/11/2020   ALBUMIN 3.5 03/11/2020   AST 19 03/11/2020   ALT 16 03/11/2020   ALKPHOS 69 03/11/2020   BILITOT 0.6 03/11/2020   GFRNONAA 3 (L) 03/11/2020   GFRAA 4 (L) 03/11/2020    Lab Results  Component Value Date   WBC 5.5 03/11/2020   NEUTROABS 2.8 03/11/2020   HGB 12.9 03/11/2020   HCT 41.1 03/11/2020   MCV 91.1 03/11/2020   PLT 304 03/11/2020     STUDIES: NM PET Image Initial (PI) Skull Base To Thigh  Result Date: 03/15/2020 CLINICAL DATA:  Initial treatment strategy for uterine/cervical cancer staging. EXAM: NUCLEAR MEDICINE PET SKULL BASE TO THIGH TECHNIQUE: 8.109 mCi F-18 FDG was injected intravenously. Full-ring PET imaging was performed from the skull base to thigh after the radiotracer. CT data was obtained and used for attenuation correction and anatomic localization. Fasting blood glucose: 82 mg/dl COMPARISON:  August 07, 2016 FINDINGS: Mediastinal blood pool activity: SUV max 1.94 Liver activity: SUV max NA NECK: No hypermetabolic lymph nodes in the neck. Incidental CT findings: none CHEST: No hypermetabolic mediastinal or hilar nodes. No suspicious pulmonary nodules on the CT scan. Incidental CT findings: Calcified atheromatous plaque of the thoracic aorta. No aneurysmal dilation. Heart size mildly enlarged. No pericardial effusion. Esophagus mildly patulous. Basilar atelectasis. Airways are patent. No dense consolidation, suspicious pulmonary nodule though parenchymal assessment in the chest is limited by respiratory motion ABDOMEN/PELVIS: No abnormal hypermetabolic activity within the liver, pancreas, adrenal glands, or spleen. Mild increased metabolism about the cervix and mild fullness of the cervix (SUVmax = 3.0) Mild pelvic nodal prominence (image 206, series 3) 7 mm RIGHT external iliac lymph node (SUVmax = 2.4) Scattered similar appearing pelvic lymph nodes along with mildly enlarged inguinal lymph nodes which show activity less than mediastinal blood pool rather than only slightly above mediastinal blood pool as with other lymph nodes in the pelvis. Incidental CT findings: Peritoneal dialysis catheter in place with resultant ascites. Mild fullness of the LEFT adrenal gland with low-density compatible with benign adenoma unchanged. Renal cortical scarring and parenchymal loss compatible with end-stage renal disease. Bowel floats in ascites. Atherosclerotic changes in the abdominal aorta without aneurysmal dilation. Calcifications extend into visceral branches. SKELETON: No focal hypermetabolic activity to suggest skeletal metastasis. Mildly increased metabolic activity adjacent to the LEFT acetabulum within musculature in this location likely tendinopathy. Incidental CT findings: Spinal degenerative changes. IMPRESSION: 1. Low level activity in the region of the cervix may correspond to primary neoplasm in this location. 2.  Low level activity, near mediastinal blood pool, in bilateral unenlarged lymph nodes is nonspecific, potentially related to reactive lymph nodes. Size is only minimally increased within the index node in the RIGHT external iliac chain as compared to a study from 2017, perhaps 2 mm larger than it was at that time. 3. Signs of peritoneal dialysis with ascitic fluid throughout. Electronically Signed   By: Zetta Bills M.D.   On: 03/15/2020 15:12    ASSESSMENT: Cervical cancer.  PLAN:    1.  Cervical cancer: PET scan results from March 15, 2020 reviewed independently and report as above with low-level activity in the region of cervix corresponding to primary neoplasm.  Patient has some low activity in bilateral lymph nodes, but unclear if these are reactive or metastatic lesions.  She  had consultation with radiation oncology earlier today.  Patient will benefit from concurrent radiation and chemotherapy.  Will dose reduce cisplatin 50% secondary to her end-stage renal disease.  She will receive weekly treatments along with her XRT.  Return to clinic in approximately 2 weeks to initiate cycle 1 of weekly cisplatin. 2.  End-stage renal disease: Continue nightly peritoneal dialysis.  Patient reports that she is on the kidney transplant list.  This diagnosis or treatment should not change that status.    I spent a total of 60 minutes reviewing chart data, face-to-face evaluation with the patient, counseling and coordination of care as detailed above.   Patient expressed understanding and was in agreement with this plan. She also understands that She can call clinic at any time with any questions, concerns, or complaints.   Cancer Staging No matching staging information was found for the patient.  Lloyd Huger, MD   03/20/2020 1:37 PM

## 2020-03-20 NOTE — Consult Note (Signed)
NEW PATIENT EVALUATION  Name: Tracy Jennings  MRN: [id number]  Date:   03/20/2020     DOB: Feb 12, 1956   This 64 y.o. 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  REFERRING PHYSICIAN: Sharyne Peach, MD  CHIEF COMPLAINT:  Chief Complaint  Patient presents with  . Cervical Cancer    DIAGNOSIS: The encounter diagnosis was 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[place]).   PREVIOUS INVESTIGATIONS:  Clinical notes reviewed PET CT scan reviewed Pathology report reviewed  HPI: Patient is a 64 year old female with multiple medical comorbidities including end-stage renal disease who is on peritoneal dialysis at home.  She was seen for evaluation by Dr. Leonides Schanz when her Pap smear showed atypical squamous cells cannot exclude HSIL.  She is previously had treatment for cervical vaginal vulvar dysplasia with no repeat Paps smear since 4 years prior.  Pelvic exam by Dr. Fransisca Connors last week was unremarkable.  She underwent biopsy with ECC positive for adenocarcinoma.  She has bilateral carotid bifurcation plaque with 50% diameter and antegrade bilateral vertebral arterial flow.  She has a cardiac echo showing an ejection fraction of 60 to 65%.  PET CT scan was performed showing low-level hyper hypermetabolic activity around 3.0 SUV in the region of the cervix most likely corresponding to her primary neoplasm.  She does have some mildly enlarged pelvic lymph nodes not hypermetabolic on PET/CT.  Dr. Hamilton Capri believe she is a poor surgical candidate for radical hysterectomy due to her end-stage renal disease.  We are treating this is a primary cervical adenocarcinoma with the intention of delivering concurrent chemoradiation.  She is seen today she is doing well she specifically denies any vaginal bleed she produces very little urine outflow.  She is having no  specific bowel problems.  PLANNED TREATMENT REGIMEN: Concurrent chemoradiation therapy radiation would be both external beam treatment as well as vaginal brachytherapy  PAST MEDICAL HISTORY:  has a past medical history of Anemia (04/2018), Cervical cancer ([place]), CKD (chronic kidney disease), COVID-19 virus detected (10/28/2019), Diabetes mellitus without complication ([place]), ESRD (end stage renal disease) ([place]), Heart murmur, HSIL (high grade squamous intraepithelial lesion) on Pap smear of cervix, Hyperlipidemia associated with type 2 diabetes mellitus ([place]), Hypertension, Pancreatitis, and Peripheral vascular disease ([place]).    PAST SURGICAL HISTORY:  Past Surgical History:  Procedure Laterality Date  . AMPUTATION TOE Left 2013   2nd toe. tip of toe (toe nail was infected)  . AV FISTULA PLACEMENT Left 05/11/2018   Procedure: ARTERIOVENOUS (AV) FISTULA CREATION;  Surgeon: Katha Cabal, MD;  Location: [facility];  Service: Vascular;  Laterality: Left;  . CATARACT EXTRACTION    . CHOLECYSTECTOMY  2014  . COLONOSCOPY    . COLONOSCOPY WITH PROPOFOL N/A 01/10/2018   Procedure: COLONOSCOPY WITH PROPOFOL;  Surgeon: Toledo, Benay Pike, MD;  Location: [facility];  Service: Gastroenterology;  Laterality: N/A;  . DIALYSIS/PERMA CATHETER INSERTION N/A 05/21/2018   Procedure: DIALYSIS/PERMA CATHETER INSERTION;  Surgeon: Algernon Huxley, MD;  Location: [place] [facility];  Service: Cardiovascular;  Laterality: N/A;  . DIALYSIS/PERMA CATHETER REMOVAL N/A 04/11/2019   Procedure: DIALYSIS/PERMA CATHETER REMOVAL;  Surgeon: Algernon Huxley, MD;  Location: [place] [facility];  Service: Cardiovascular;  Laterality: N/A;  . EYE SURGERY Bilateral 2018   cataract extractions  . THROMBECTOMY W/ EMBOLECTOMY  05/11/2018   Procedure: THROMBECTOMY ARTERIOVENOUS FISTULA;  Surgeon: Katha Cabal,  MD;  Location: [facility];  Service: Vascular;;  . TUBAL LIGATION  1984    FAMILY HISTORY: family history includes Cancer  in her father; Diabetes in her maternal grandfather, maternal grandmother, paternal grandfather, paternal grandmother, and sister; Gout in her mother; Hypertension in her mother; Stroke in her mother.  SOCIAL HISTORY:  reports that she has never smoked. She has never used smokeless tobacco. She reports that she does not drink alcohol and does not use drugs.  ALLERGIES: Amlodipine, Hctz [hydrochlorothiazide], Heparin, Lmw heparin, Other, Amlodipine besylate, Sulfa antibiotics, Dairy aid [lactase], and Hydralazine  MEDICATIONS:  Current Outpatient Medications  Medication Sig Dispense Refill  . aspirin EC 81 MG tablet Take 81 mg by mouth daily.    . calcitRIOL (ROCALTROL) 0.25 MCG capsule Take 0.25 mcg by mouth daily.    . calcium acetate (PHOSLO) 667 MG capsule Take 1,334 mg by mouth 3 (three) times daily.    . carvedilol (COREG) 25 MG tablet Take 25 mg by mouth 2 (two) times daily with a meal.    . clopidogrel (PLAVIX) 75 MG tablet Take 75 mg by mouth daily.    . furosemide (LASIX) 80 MG tablet Take 1 tablet (80 mg total) by mouth daily. 30 tablet 3  . gentamicin cream (GARAMYCIN) 0.1 % Apply 1 application topically 3 (three) times daily.    . ondansetron (ZOFRAN) 4 MG tablet Take 1 tablet (4 mg total) by mouth every 6 (six) hours as needed for nausea. 30 tablet 1  . Vitamin D, Ergocalciferol, (DRISDOL) 50000 units CAPS capsule TAKE ONE CAPSULE BY MOUTH ONE TIME PER WEEK  0   No current facility-administered medications for this encounter.    ECOG PERFORMANCE STATUS:  0 - Asymptomatic  REVIEW OF SYSTEMS: Patient does have end-stage renal disease as well as anemia secondary to low iron.  She had COVID-19 back in February she is has adult onset diabetes peripheral vascular disease Patient denies any weight loss, fatigue, weakness, fever, chills or night sweats. Patient denies any loss of vision, blurred vision. Patient denies any ringing  of the ears or hearing loss. No irregular heartbeat.  Patient denies heart murmur or history of fainting. Patient denies any chest pain or pain radiating to her upper extremities. Patient denies any shortness of breath, difficulty breathing at night, cough or hemoptysis. Patient denies any swelling in the lower legs. Patient denies any nausea vomiting, vomiting of blood, or coffee ground material in the vomitus. Patient denies any stomach pain. Patient states has had normal bowel movements no significant constipation or diarrhea. Patient denies any dysuria, hematuria or significant nocturia. Patient denies any problems walking, swelling in the joints or loss of balance. Patient denies any skin changes, loss of hair or loss of weight. Patient denies any excessive worrying or anxiety or significant depression. Patient denies any problems with insomnia. Patient denies excessive thirst, polyuria, polydipsia. Patient denies any swollen glands, patient denies easy bruising or easy bleeding. Patient denies any recent infections, allergies or URI. Patient "s visual fields have not changed significantly in recent time.   PHYSICAL EXAM: BP (!) 204/102 (BP Location: Left Arm, Patient Position: Sitting, Cuff Size: Normal)   Pulse 83   Temp (!) 97 F (36.1 C) (Tympanic)   Resp 16   Wt 154 lb 1.6 oz (69.9 kg)   BMI 27.30 kg/m  Well-developed well-nourished patient in NAD. HEENT reveals PERLA, EOMI, discs not visualized.  Oral cavity is clear. No oral mucosal lesions are identified. Neck is clear  without evidence of cervical or supraclavicular adenopathy. Lungs are clear to A&P. Cardiac examination is essentially unremarkable with regular rate and rhythm without murmur rub or thrill. Abdomen is benign with no organomegaly or masses noted. Motor sensory and DTR levels are equal and symmetric in the upper and lower extremities. Cranial nerves II through XII are grossly intact. Proprioception is intact. No peripheral adenopathy or edema is identified. No motor or sensory  levels are noted. Crude visual fields are within normal range.  LABORATORY DATA: Pathology report reviewed    RADIOLOGY RESULTS: PET CT scan reviewed compatible with above-stated findings   IMPRESSION: Adenocarcinoma of the cervix in 64 year old female with end-stage renal disease on peritoneal dialysis  PLAN: At this time I I would recommend whole pelvic radiation therapy.  To 4500 cGy over 5 weeks.  I would also recommend and refer her to Dr. Christel Mormon at [facility] for possible vaginal brachytherapy with curative intent.  Risks and benefits of external beam radiation therapy including diarrhea fatigue alteration of blood counts possible increased urinary frequency although her urinary outflow is minimal as well as possible vaginal stenosis all were described in detail to the patient and her husband.  They both seem to comprehend my treatment plan well.  She will have a referral to Dr. Grayland Ormond after my appointment today.  I have personally set up and ordered CT simulation for next week.  I would like to take this opportunity to thank you for allowing me to participate in the care of your patient.Noreene Filbert, MD

## 2020-03-23 ENCOUNTER — Ambulatory Visit
Admission: RE | Admit: 2020-03-23 | Discharge: 2020-03-23 | Disposition: A | Discharge status: Home / Self Care | Payer: BC Managed Care – PPO | Source: Ambulatory Visit | Attending: Radiation Oncology | Admitting: Radiation Oncology

## 2020-03-23 DIAGNOSIS — C539 Malignant neoplasm of cervix uteri, unspecified: Secondary | ICD-10-CM | POA: Insufficient documentation

## 2020-03-23 NOTE — Progress Notes (Signed)
Pharmacist Chemotherapy Monitoring - Initial Assessment    Anticipated start date:   Regimen:  . Are orders appropriate based on the patient's diagnosis, regimen, and cycle? Yes . Does the plan date match the patient's scheduled date? Yes . Is the sequencing of drugs appropriate? Yes . Are the premedications appropriate for the patient's regimen? Yes . Prior Authorization for treatment is: Pending o If applicable, is the correct biosimilar selected based on the patient's insurance? not applicable  Organ Function and Labs: Marland Kitchen Are dose adjustments needed based on the patient's renal function, hepatic function, or hematologic function? No . Are appropriate labs ordered prior to the start of patient's treatment? Yes . Other organ system assessment, if indicated: cisplatin: baseline audiogram . The following baseline labs, if indicated, have been ordered: N/A  Dose Assessment: . Are the drug doses appropriate? Yes . Are the following correct: o Drug concentrations Yes o IV fluid compatible with drug Yes o Administration routes Yes o Timing of therapy Yes . If applicable, does the patient have documented access for treatment and/or plans for port-a-cath placement? not applicable . If applicable, have lifetime cumulative doses been properly documented and assessed? not applicable Lifetime Dose Tracking  No doses have been documented on this patient for the following tracked chemicals: Doxorubicin, Epirubicin, Idarubicin, Daunorubicin, Mitoxantrone, Bleomycin, Oxaliplatin, Carboplatin, Liposomal Doxorubicin  o   Toxicity Monitoring/Prevention: . The patient has the following take home antiemetics prescribed: N/A . The patient has the following take home medications prescribed: N/A . Medication allergies and previous infusion related reactions, if applicable, have been reviewed and addressed. Yes . The patient's current medication list has been assessed for drug-drug interactions with their  chemotherapy regimen. no significant drug-drug interactions were identified on review.  Order Review: . Are the treatment plan orders signed? Yes . Is the patient scheduled to see a provider prior to their treatment? Yes  I verify that I have reviewed each item in the above checklist and answered each question accordingly.  Tracy Jennings K 03/23/2020 1:39 PM

## 2020-03-23 NOTE — Patient Instructions (Signed)

## 2020-03-24 ENCOUNTER — Inpatient Hospital Stay: Payer: BC Managed Care – PPO

## 2020-03-24 ENCOUNTER — Other Ambulatory Visit: Payer: Self-pay

## 2020-03-24 ENCOUNTER — Inpatient Hospital Stay (HOSPITAL_BASED_OUTPATIENT_CLINIC_OR_DEPARTMENT_OTHER): Payer: BC Managed Care – PPO | Admitting: Hospice and Palliative Medicine

## 2020-03-24 DIAGNOSIS — C539 Malignant neoplasm of cervix uteri, unspecified: Secondary | ICD-10-CM | POA: Diagnosis not present

## 2020-03-24 DIAGNOSIS — C530 Malignant neoplasm of endocervix: Secondary | ICD-10-CM | POA: Diagnosis not present

## 2020-03-24 NOTE — Progress Notes (Signed)
[address]  Telephone:([phone] [phone]  Patient Care Team: Sharyne Peach, MD as PCP - General (Family Medicine) Noreene Filbert, MD as Radiation Oncologist (Radiation Oncology)   Name of the patient: Tracy Jennings  [id number]  [birth year]   Date of visit: 03/24/20  Diagnosis- cervical cancer  Chief complaint/Reason for visit- Initial Meeting for [facility], preparing for starting chemotherapy  Heme/Onc history:  Oncology History  Cervical cancer ([place])  03/11/2020 Initial Diagnosis   Cervical cancer ([place])   03/30/2020 -  Chemotherapy   The patient had palonosetron (ALOXI) injection 0.25 mg, 0.25 mg, Intravenous,  Once, 0 of 7 cycles CISplatin (PLATINOL) 35 mg in sodium chloride 0.9 % 250 mL chemo infusion, 20 mg/m2 = 35 mg (100 % of original dose 20 mg/m2), Intravenous,  Once, 0 of 7 cycles Dose modification: 20 mg/m2 (original dose 20 mg/m2, Cycle 1, Reason: Other (see comments)) fosaprepitant (EMEND) 150 mg in sodium chloride 0.9 % 145 mL IVPB, 150 mg, Intravenous,  Once, 0 of 7 cycles  for chemotherapy treatment.      Interval history-  Patient presents to chemo care clinic today for initial meeting in preparation for starting chemotherapy. I introduced the chemo care clinic and we discussed that the role of the clinic is to assist those who are at an increased risk of emergency room visits and/or complications during the course of chemotherapy treatment. We discussed that the increased risk takes into account factors such as age, performance status, and co-morbidities. We also discussed that for some, this might include barriers to care such as not having a primary care provider, lack of insurance/transportation, or not being able to afford medications. We discussed that the goal of the program is to help prevent unplanned ER visits and help reduce complications during chemotherapy. We do this by  discussing specific risk factors to each individual and identifying ways that we can help improve these risk factors and reduce barriers to care.   Allergies  Allergen Reactions  . Amlodipine Swelling    Knees down to ankles Knees down to ankles  . Hctz [Hydrochlorothiazide]     pancreatitis  . Heparin Other (See Comments)    Pt reports cardiac arrest when given heparin  . Lmw Heparin     Reports cardiac arrest when given heparin  . Other Other (See Comments)    Reports cardiac arrest when given during surgery Reports cardiac arrest when given during surgery Reports cardiac arrest when given during surgery   . Amlodipine Besylate Swelling  . Sulfa Antibiotics Rash  . Dairy Aid [Lactase]     Runny nose  . Hydralazine Nausea Only    Past Medical History:  Diagnosis Date  . Anemia 04/2018   low iron. to be started on supplements  . Cervical cancer ([place])   . CKD (chronic kidney disease)    Stage IV  . COVID-19 virus detected 10/28/2019  . Diabetes mellitus without complication ([place])    type II  . ESRD (end stage renal disease) ([place])   . Heart murmur    followed as a child only  . HSIL (high grade squamous intraepithelial lesion) on Pap smear of cervix   . Hyperlipidemia associated with type 2 diabetes mellitus ([place])   . Hypertension   . Pancreatitis   . Peripheral vascular disease [facility])     Past Surgical History:  Procedure Laterality Date  . AMPUTATION TOE Left 2013   2nd toe.  tip of toe (toe nail was infected)  . AV FISTULA PLACEMENT Left 05/11/2018   Procedure: ARTERIOVENOUS (AV) FISTULA CREATION;  Surgeon: Katha Cabal, MD;  Location: [facility] ORS;  Service: Vascular;  Laterality: Left;  . CATARACT EXTRACTION    . CHOLECYSTECTOMY  2014  . COLONOSCOPY    . COLONOSCOPY WITH PROPOFOL N/A 01/10/2018   Procedure: COLONOSCOPY WITH PROPOFOL;  Surgeon: Toledo, Benay Pike, MD;  Location: [facility] ENDOSCOPY;  Service: Gastroenterology;  Laterality: N/A;  . DIALYSIS/PERMA  CATHETER INSERTION N/A 05/21/2018   Procedure: DIALYSIS/PERMA CATHETER INSERTION;  Surgeon: Algernon Huxley, MD;  Location: [place] CV LAB;  Service: Cardiovascular;  Laterality: N/A;  . DIALYSIS/PERMA CATHETER REMOVAL N/A 04/11/2019   Procedure: DIALYSIS/PERMA CATHETER REMOVAL;  Surgeon: Algernon Huxley, MD;  Location: [place] CV LAB;  Service: Cardiovascular;  Laterality: N/A;  . EYE SURGERY Bilateral 2018   cataract extractions  . THROMBECTOMY W/ EMBOLECTOMY  05/11/2018   Procedure: THROMBECTOMY ARTERIOVENOUS FISTULA;  Surgeon: Katha Cabal, MD;  Location: [facility] ORS;  Service: Vascular;;  . TUBAL LIGATION  1984    Social History   Socioeconomic History  . Marital status: Married    Spouse name: clarence  . Number of children: Not on file  . Years of education: Not on file  . Highest education level: Not on file  Occupational History    Comment: works nights  Tobacco Use  . Smoking status: Never Smoker  . Smokeless tobacco: Never Used  Vaping Use  . Vaping Use: Never used  Substance and Sexual Activity  . Alcohol use: No  . Drug use: Never  . Sexual activity: Not Currently  Other Topics Concern  . Not on file  Social History Narrative  . Not on file   Social Determinants of Health   Financial Resource Strain:   . Difficulty of Paying Living Expenses:   Food Insecurity:   . Worried About Charity fundraiser in the Last Year:   . Arboriculturist in the Last Year:   Transportation Needs:   . Film/video editor (Medical):   Marland Kitchen Lack of Transportation (Non-Medical):   Physical Activity:   . Days of Exercise per Week:   . Minutes of Exercise per Session:   Stress:   . Feeling of Stress :   Social Connections:   . Frequency of Communication with Friends and Family:   . Frequency of Social Gatherings with Friends and Family:   . Attends Religious Services:   . Active Member of Clubs or Organizations:   . Attends Archivist Meetings:   Marland Kitchen Marital  Status:   Intimate Partner Violence:   . Fear of Current or Ex-Partner:   . Emotionally Abused:   Marland Kitchen Physically Abused:   . Sexually Abused:     Family History  Problem Relation Age of Onset  . Stroke Mother   . Hypertension Mother   . Gout Mother   . Cancer Father   . Diabetes Sister   . Diabetes Maternal Grandmother   . Diabetes Maternal Grandfather   . Diabetes Paternal Grandmother   . Diabetes Paternal Grandfather      Current Outpatient Medications:  .  aspirin EC 81 MG tablet, Take 81 mg by mouth daily., Disp: , Rfl:  .  calcitRIOL (ROCALTROL) 0.25 MCG capsule, Take 0.25 mcg by mouth daily., Disp: , Rfl:  .  calcium acetate (PHOSLO) 667 MG capsule, Take 1,334 mg by mouth 3 (three) times daily.,  Disp: , Rfl:  .  carvedilol (COREG) 25 MG tablet, Take 25 mg by mouth 2 (two) times daily with a meal., Disp: , Rfl:  .  clopidogrel (PLAVIX) 75 MG tablet, Take 75 mg by mouth daily., Disp: , Rfl:  .  furosemide (LASIX) 80 MG tablet, Take 1 tablet (80 mg total) by mouth daily., Disp: 30 tablet, Rfl: 3 .  gentamicin cream (GARAMYCIN) 0.1 %, Apply 1 application topically 3 (three) times daily., Disp: , Rfl:  .  ondansetron (ZOFRAN) 4 MG tablet, Take 1 tablet (4 mg total) by mouth every 6 (six) hours as needed for nausea., Disp: 30 tablet, Rfl: 1 .  ondansetron (ZOFRAN) 8 MG tablet, Take 1 tablet (8 mg total) by mouth 2 (two) times daily as needed., Disp: 60 tablet, Rfl: 1 .  prochlorperazine (COMPAZINE) 10 MG tablet, Take 1 tablet (10 mg total) by mouth every 6 (six) hours as needed (Nausea or vomiting)., Disp: 60 tablet, Rfl: 1 .  Vitamin D, Ergocalciferol, (DRISDOL) 50000 units CAPS capsule, TAKE ONE CAPSULE BY MOUTH ONE TIME PER WEEK, Disp: , Rfl: 0  CMP Latest Ref Rng & Units 03/11/2020  Glucose 70 - 99 mg/dL 110(H)  BUN 8 - 23 mg/dL 48(H)  Creatinine 0.44 - 1.00 mg/dL 10.57(H)  Sodium 135 - 145 mmol/L 134(L)  Potassium 3.5 - 5.1 mmol/L 3.2(L)  Chloride 98 - 111 mmol/L 91(L)   CO2 22 - 32 mmol/L 27  Calcium 8.9 - 10.3 mg/dL 8.6(L)  Total Protein 6.5 - 8.1 g/dL 8.4(H)  Total Bilirubin 0.3 - 1.2 mg/dL 0.6  Alkaline Phos 38 - 126 U/L 69  AST 15 - 41 U/L 19  ALT 0 - 44 U/L 16   CBC Latest Ref Rng & Units 03/11/2020  WBC 4.0 - 10.5 K/uL 5.5  Hemoglobin 12.0 - 15.0 g/dL 12.9  Hematocrit 36 - 46 % 41.1  Platelets 150 - 400 K/uL 304    No images are attached to the encounter.  NM PET Image Initial (PI) Skull Base To Thigh  Result Date: 03/15/2020 CLINICAL DATA:  Initial treatment strategy for uterine/cervical cancer staging. EXAM: NUCLEAR MEDICINE PET SKULL BASE TO THIGH TECHNIQUE: 8.109 mCi F-18 FDG was injected intravenously. Full-ring PET imaging was performed from the skull base to thigh after the radiotracer. CT data was obtained and used for attenuation correction and anatomic localization. Fasting blood glucose: 82 mg/dl COMPARISON:  August 07, 2016 FINDINGS: Mediastinal blood pool activity: SUV max 1.94 Liver activity: SUV max NA NECK: No hypermetabolic lymph nodes in the neck. Incidental CT findings: none CHEST: No hypermetabolic mediastinal or hilar nodes. No suspicious pulmonary nodules on the CT scan. Incidental CT findings: Calcified atheromatous plaque of the thoracic aorta. No aneurysmal dilation. Heart size mildly enlarged. No pericardial effusion. Esophagus mildly patulous. Basilar atelectasis. Airways are patent. No dense consolidation, suspicious pulmonary nodule though parenchymal assessment in the chest is limited by respiratory motion ABDOMEN/PELVIS: No abnormal hypermetabolic activity within the liver, pancreas, adrenal glands, or spleen. Mild increased metabolism about the cervix and mild fullness of the cervix (SUVmax = 3.0) Mild pelvic nodal prominence (image 206, series 3) 7 mm RIGHT external iliac lymph node (SUVmax = 2.4) Scattered similar appearing pelvic lymph nodes along with mildly enlarged inguinal lymph nodes which show activity less than  mediastinal blood pool rather than only slightly above mediastinal blood pool as with other lymph nodes in the pelvis. Incidental CT findings: Peritoneal dialysis catheter in place with resultant ascites. Mild fullness of the  LEFT adrenal gland with low-density compatible with benign adenoma unchanged. Renal cortical scarring and parenchymal loss compatible with end-stage renal disease. Bowel floats in ascites. Atherosclerotic changes in the abdominal aorta without aneurysmal dilation. Calcifications extend into visceral branches. SKELETON: No focal hypermetabolic activity to suggest skeletal metastasis. Mildly increased metabolic activity adjacent to the LEFT acetabulum within musculature in this location likely tendinopathy. Incidental CT findings: Spinal degenerative changes. IMPRESSION: 1. Low level activity in the region of the cervix may correspond to primary neoplasm in this location. 2. Low level activity, near mediastinal blood pool, in bilateral unenlarged lymph nodes is nonspecific, potentially related to reactive lymph nodes. Size is only minimally increased within the index node in the RIGHT external iliac chain as compared to a study from 2017, perhaps 2 mm larger than it was at that time. 3. Signs of peritoneal dialysis with ascitic fluid throughout. Electronically Signed   By: Zetta Bills M.D.   On: 03/15/2020 15:12     Assessment and plan- Patient is a 64 y.o. female who presents to [facility] for initial meeting in preparation for starting chemotherapy for the treatment of cervical cancer.   Chemo Care Clinic/High Risk for ER/Hospitalization during chemotherapy- We discussed the role of the chemo care clinic and identified patient specific risk factors. I discussed that patient was identified as high risk primarily based on:  Patient has past medical history positive for: Past Medical History:  Diagnosis Date  . Anemia 04/2018   low iron. to be started on supplements  .  Cervical cancer ([place])   . CKD (chronic kidney disease)    Stage IV  . COVID-19 virus detected 10/28/2019  . Diabetes mellitus without complication ([place])    type II  . ESRD (end stage renal disease) ([place])   . Heart murmur    followed as a child only  . HSIL (high grade squamous intraepithelial lesion) on Pap smear of cervix   . Hyperlipidemia associated with type 2 diabetes mellitus ([place])   . Hypertension   . Pancreatitis   . Peripheral vascular disease ([place])     Patient has past surgical history positive for: Past Surgical History:  Procedure Laterality Date  . AMPUTATION TOE Left 2013   2nd toe. tip of toe (toe nail was infected)  . AV FISTULA PLACEMENT Left 05/11/2018   Procedure: ARTERIOVENOUS (AV) FISTULA CREATION;  Surgeon: Katha Cabal, MD;  Location: [facility] ORS;  Service: Vascular;  Laterality: Left;  . CATARACT EXTRACTION    . CHOLECYSTECTOMY  2014  . COLONOSCOPY    . COLONOSCOPY WITH PROPOFOL N/A 01/10/2018   Procedure: COLONOSCOPY WITH PROPOFOL;  Surgeon: Toledo, Benay Pike, MD;  Location: [facility] ENDOSCOPY;  Service: Gastroenterology;  Laterality: N/A;  . DIALYSIS/PERMA CATHETER INSERTION N/A 05/21/2018   Procedure: DIALYSIS/PERMA CATHETER INSERTION;  Surgeon: Algernon Huxley, MD;  Location: [place] CV LAB;  Service: Cardiovascular;  Laterality: N/A;  . DIALYSIS/PERMA CATHETER REMOVAL N/A 04/11/2019   Procedure: DIALYSIS/PERMA CATHETER REMOVAL;  Surgeon: Algernon Huxley, MD;  Location: [place] CV LAB;  Service: Cardiovascular;  Laterality: N/A;  . EYE SURGERY Bilateral 2018   cataract extractions  . THROMBECTOMY W/ EMBOLECTOMY  05/11/2018   Procedure: THROMBECTOMY ARTERIOVENOUS FISTULA;  Surgeon: Katha Cabal, MD;  Location: [facility] ORS;  Service: Vascular;;  . TUBAL LIGATION  1984     We discussed that social determinants of health may have significant impacts on health and outcomes for cancer patients.  Today we discussed specific social  determinants of  performance status, alcohol use, depression, financial needs, food insecurity, housing, interpersonal violence, social connections, stress, tobacco use, and transportation.    After lengthy discussion the following were identified as areas of need:   Outpatient services: We discussed options including home based and outpatient services, DME and care program. We discusssed that patients who participate in regular physical activity report fewer negative impacts of cancer and treatments and report less fatigue.   Financial Concerns: We discussed that living with cancer can create tremendous financial burden.  We discussed options for assistance. I asked that if assistance is needed in affording medications or paying bills to please let us know so that we can provide assistance. We discussed options for food including social services, [employer] ($50 every 2 weeks) and onsite food pantry.  We will also notify [facility]ister [facility] to see if cancer center can provide additional support.  Referral to Social work: Introduced Education officer, museum Elease Etienne and the services he can provide such as support with utility bill, cell phone and gas vouchers.   Support groups: We discussed options for support groups at the cancer center. If interested, please notify nurse navigator to enroll. We discussed options for managing stress including healthy eating, exercise as well as participating in no charge counseling services at the cancer center and support groups.  If these are of interest, patient can notify either myself or primary nursing team.We discussed options for management including medications and referral to quit Smart program  Transportation: We discussed options for transportation including [employer], paratransit, bus routes, link transit, taxi/uber/lyft, and cancer center van.  I have notified primary oncology team who will help assist with arranging Lucianne Lei transportation for appointments when/if needed. We also  discussed options for transportation on short notice/acute visits.  Palliative care services: We have palliative care services available in the cancer center to discuss goals of care and advanced care planning.  Please let us know if you have any questions or would like to speak to our palliative nurse practitioner.  Symptom Management Clinic: We discussed our symptom management clinic which is available for acute concerns while receiving treatment such as nausea, vomiting or diarrhea.  We can be reached via telephone at [phone] or through my chart.  We are available for virtual or in person visits on the same day from 830 to 4 PM Monday through Friday. She denies needing specific assistance at this time and She will be followed by Dr. Gary Fleet clinical team.  Plan: Discussed symptom management clinic. Discussed palliative care services. Discussed resources that are available here at the cancer center.  Disposition: RTC on  7/19 to see Dr. Grayland Ormond  Visit Diagnosis 1. Malignant neoplasm of endocervix [facility])     Patient expressed understanding and was in agreement with this plan. She also understands that She can call clinic at any time with any questions, concerns, or complaints.   I provided 10 minutes of face to face time during this encounter, and > 50% was spent counseling as documented under my assessment & plan.   Altha Harm, PhD, NP-C [place] [facility] [phone]

## 2020-03-25 ENCOUNTER — Telehealth: Payer: Self-pay

## 2020-03-25 ENCOUNTER — Telehealth: Payer: Self-pay | Admitting: Obstetrics and Gynecology

## 2020-03-25 DIAGNOSIS — C539 Malignant neoplasm of cervix uteri, unspecified: Secondary | ICD-10-CM | POA: Diagnosis not present

## 2020-03-25 NOTE — Telephone Encounter (Signed)
Voicemail left with Tracy Jennings to return call regarding having a  CONE vs. Chemoradiation.

## 2020-03-25 NOTE — Telephone Encounter (Signed)
I have reviewed the PET scan and the cervical biopsy report with pathology:   PET scan report shows some FDG activity in the cervix (none in uterus), but no prominent cervical mass.  Some PET activity in pelvic nodes, but not above blood pool and no other primary or metastatic disease noted.    I spoke with the pathologist at [employer] [facility] and asked her to review the cervical biopsies done by Dr Leonides Schanz.  She said that the cancer is well differentiated and that she could not confirm that there was more than 3 mm of invasion on the biopsies.  She will amend the report. We had started the process of preparing her for chemoradiation for treatment of cervical adenocarcinoma. However, based on the above imaging and path review, she really needs a cone biopsy (and D&C) to confirm that there is more than 3 mm of invasive cancer in the cervix and no endometrial primary.  If cancer is confined to the cervix and invasion is < 49mm, this could be treated with a large cone biopsy with negative margins.  This would be ideal since she has significant medical problems, most notably vascular disease with end stage renal disease on peritoneal dialysis and carotid stenosis.  She is on the [facility].  She is taking Plavix/ASA for thrombosis prophylaxis.  She had a thrombosis of her AVF fistula in 2019 after this was placed and had cardiac arrest post thrombectomy.    Will see her back in the next week with the goal of scheduling cone biopsy/D&C on 04/08/20.  Will contact her vascular team regarding perioperative management of her antiplatelet therapy, as there is a significant bleeding risk both at surgery and postoperatively.   If she is proven to have a cervical cancer with > 3 mm of invasion or LVSI will move ahead with plan for chemoradiation.  If she has a lesser extent of disease (stage IA1), cone biopsy with clear margins may be sufficient, and it would be better to avoid the potential toxicity of chemoradiation,  especially in view of her existing vascular disease.   Mellody Drown, MD

## 2020-03-27 ENCOUNTER — Telehealth: Payer: Self-pay

## 2020-03-27 NOTE — Telephone Encounter (Signed)
Spoke with Tracy Jennings. Office visit for cardiac clearance has been arranged for 7/19 at 1100 with Christell Faith, PA. Directions given to his office. Pre-op visit arranged for 7/21 at 0845 at the cancer center. Received permission from Dr. Nino Parsley office to hold Plavix. Will educate regarding this at her pre-op appointment.

## 2020-03-27 NOTE — Telephone Encounter (Signed)
Spoke with [name]. Notified of the need for CONE biopsy instead of chemo/radiation at this time. Educated further on CONE biopsy. Dr. Baruch Gouty notified of this change. Awaiting response from physicians regarding Plavix hold. Will arrange CONE on 7/28 with office follow up.

## 2020-03-30 ENCOUNTER — Other Ambulatory Visit: Payer: Self-pay

## 2020-03-30 ENCOUNTER — Ambulatory Visit (INDEPENDENT_AMBULATORY_CARE_PROVIDER_SITE_OTHER): Payer: BC Managed Care – PPO | Admitting: Physician Assistant

## 2020-03-30 ENCOUNTER — Inpatient Hospital Stay: Payer: BC Managed Care – PPO

## 2020-03-30 ENCOUNTER — Inpatient Hospital Stay: Payer: BC Managed Care – PPO | Admitting: Oncology

## 2020-03-30 ENCOUNTER — Encounter: Payer: Self-pay | Admitting: Physician Assistant

## 2020-03-30 ENCOUNTER — Ambulatory Visit: Payer: BC Managed Care – PPO

## 2020-03-30 VITALS — BP 182/84 | HR 77 | Ht 63.0 in | Wt 159.0 lb

## 2020-03-30 DIAGNOSIS — I951 Orthostatic hypotension: Secondary | ICD-10-CM

## 2020-03-30 DIAGNOSIS — I10 Essential (primary) hypertension: Secondary | ICD-10-CM | POA: Diagnosis not present

## 2020-03-30 DIAGNOSIS — N186 End stage renal disease: Secondary | ICD-10-CM

## 2020-03-30 DIAGNOSIS — U071 COVID-19: Secondary | ICD-10-CM

## 2020-03-30 DIAGNOSIS — Z01810 Encounter for preprocedural cardiovascular examination: Secondary | ICD-10-CM | POA: Diagnosis not present

## 2020-03-30 DIAGNOSIS — I739 Peripheral vascular disease, unspecified: Secondary | ICD-10-CM

## 2020-03-30 NOTE — Patient Instructions (Signed)
Medication Instructions:  - Your physician recommends that you continue on your current medications as directed. Please refer to the Current Medication list given to you today.  *If you need a refill on your cardiac medications before your next appointment, please call your pharmacy*   Lab Work: - none ordered  If you have labs (blood work) drawn today and your tests are completely normal, you will receive your results only by: . MyChart Message (if you have MyChart) OR . A paper copy in the mail If you have any lab test that is abnormal or we need to change your treatment, we will call you to review the results.   Testing/Procedures: - none ordered   Follow-Up: At [facility], you and your health needs are our priority.  As part of our continuing mission to provide you with exceptional heart care, we have created designated Provider Care Teams.  These Care Teams include your primary Cardiologist (physician) and Advanced Practice Providers (APPs -  Physician Assistants and Nurse Practitioners) who all work together to provide you with the care you need, when you need it.  We recommend signing up for the patient portal called "MyChart".  Sign up information is provided on this After Visit Summary.  MyChart is used to connect with patients for Virtual Visits (Telemedicine).  Patients are able to view lab/test results, encounter notes, upcoming appointments, etc.  Non-urgent messages can be sent to your provider as well.   To learn more about what you can do with MyChart, go to [email or web address]://[email or web address].com.    Your next appointment:   6 month(s)  The format for your next appointment:   In Person  Provider:    You may see Muhammad Arida, MD or one of the following Advanced Practice Providers on your designated Care Team:    Christopher Berge, NP  Ryan Dunn, PA-C  Jacquelyn Visser, PA-C    Other Instructions - n/a  

## 2020-03-30 NOTE — Progress Notes (Signed)
Cardiology Office Note    Date:  03/30/2020   ID:  Tracy Jennings, DOB 06/02/1956, MRN [id number]  PCP:  Sharyne Peach, MD  Cardiologist:  Kathlyn Sacramento, MD  Electrophysiologist:  None   Chief Complaint: Preoperative cardiac evaluation   History of Present Illness:   Tracy Jennings is a 64 y.o. female with history of ESRD on peritoneal dialysis, severe symptomatic orthostatic hypotension, COVID-19 in 10/2019, recently diagnosed cervical cancer, DM2, and HTN who presents for preoperative cardiac evaluation.  She was previously seen by [facility] in 2019 for respiratory arrest in the postoperative setting following a revision of her AV fistula.  She required CPR and mechanical ventilation.  Note indicates there was no loss of pulse.  Trivial troponin elevation at 0.08.  Consult note at that time indicates EKG showed sinus rhythm with no evidence of ischemia.  Echo showed preserved LV SF with an EF of 60 to 86%, grade 1 diastolic dysfunction, systolic bowing of the mitral valve without prolapse, and mildly dilated left atrium.  Further ischemic evaluation was deferred at that time.  More recently, she was admitted in 10/2019 for syncope felt to be related to orthostatic hypotension in the setting of COVID-19 infection.  Echo showed an EF of 60 to 65%, no regional wall motion abnormalities, grade 1 diastolic dysfunction, moderate concentric LVH, normal RV systolic function and RV cavity size, normal PASP, trivial mitral regurgitation.  Bilateral carotid arteries showed less than 50% ICA stenosis with antegrade bilateral vertebral artery flow.  She was readmitted in 10/2019 with recurrent syncope with persistent weakness, decreased appetite, and inability to care for herself at home.  Symptoms were felt to be related to orthostatic hypotension with severe systemic hypertension exacerbated by COVID-19 infection, diarrhea, dehydration/anorexia, and polypharmacy.  This was overall a  difficult situation given significant fluctuations in her BP.    She subsequently underwent dobutamine stress echo in 12/2019 through [facility] cardiology as part of her prekidney transplant work-up which was normal, without evidence of ischemia with stress, as outlined below.  More recently, she was diagnosed with cervical cancer with plans to undergo cervical conization with biopsy and chemoradiation.  She comes in doing well from a cardiac perspective.  She denies any symptoms of angina or dyspnea palpitations, dizziness, presyncope, syncope.  No lower extremity swelling, abdominal distention, orthopnea, PND, or early satiety.  Her blood pressures have been much more stable at home with readings typically in the 578I to 696E systolic.  She is tolerating carvedilol and diltiazem without issues.  D[facility] Activity Status Index: >4 METs Revised Cardiac Index: Low risk for noncardiac surgery  Labs independently reviewed: 02/2020 - HGB 12.9, PLT 304, potassium 3.2, BUN 48, SCr 10.57, albumin 3.5, AST/ALT normal 10/2019 - TSH normal, A1c 6.3 08/2019 - TC 265, TG 135, HDL 74, LDL 164  Past Medical History:  Diagnosis Date  . Anemia 04/2018   low iron. to be started on supplements  . Cervical cancer ([place])   . CKD (chronic kidney disease)    Stage IV  . COVID-19 virus detected 10/28/2019  . Diabetes mellitus without complication ([place])    type II  . ESRD (end stage renal disease) ([place])   . Heart murmur    followed as a child only  . HSIL (high grade squamous intraepithelial lesion) on Pap smear of cervix   . Hyperlipidemia associated with type 2 diabetes mellitus ([place])   . Hypertension   . Pancreatitis   . Peripheral vascular  disease [facility])     Past Surgical History:  Procedure Laterality Date  . AMPUTATION TOE Left 2013   2nd toe. tip of toe (toe nail was infected)  . AV FISTULA PLACEMENT Left 05/11/2018   Procedure: ARTERIOVENOUS (AV) FISTULA CREATION;  Surgeon: Katha Cabal, MD;   Location: [facility] ORS;  Service: Vascular;  Laterality: Left;  . CATARACT EXTRACTION    . CHOLECYSTECTOMY  2014  . COLONOSCOPY    . COLONOSCOPY WITH PROPOFOL N/A 01/10/2018   Procedure: COLONOSCOPY WITH PROPOFOL;  Surgeon: Toledo, Benay Pike, MD;  Location: [facility] ENDOSCOPY;  Service: Gastroenterology;  Laterality: N/A;  . DIALYSIS/PERMA CATHETER INSERTION N/A 05/21/2018   Procedure: DIALYSIS/PERMA CATHETER INSERTION;  Surgeon: Algernon Huxley, MD;  Location: [place] CV LAB;  Service: Cardiovascular;  Laterality: N/A;  . DIALYSIS/PERMA CATHETER REMOVAL N/A 04/11/2019   Procedure: DIALYSIS/PERMA CATHETER REMOVAL;  Surgeon: Algernon Huxley, MD;  Location: [place] CV LAB;  Service: Cardiovascular;  Laterality: N/A;  . EYE SURGERY Bilateral 2018   cataract extractions  . THROMBECTOMY W/ EMBOLECTOMY  05/11/2018   Procedure: THROMBECTOMY ARTERIOVENOUS FISTULA;  Surgeon: Katha Cabal, MD;  Location: [facility] ORS;  Service: Vascular;;  . TUBAL LIGATION  1984    Current Medications: Current Meds  Medication Sig  . calcitRIOL (ROCALTROL) 0.25 MCG capsule Take 0.25 mcg by mouth daily.  . calcium acetate (PHOSLO) 667 MG capsule Take 1,334 mg by mouth 2 (two) times daily with a meal.   . carvedilol (COREG) 25 MG tablet Take 25 mg by mouth 2 (two) times daily with a meal.  . clopidogrel (PLAVIX) 75 MG tablet Take 75 mg by mouth daily.  Marland Kitchen diltiazem (DILACOR XR) 120 MG 24 hr capsule Take 120 mg by mouth daily.  . prochlorperazine (COMPAZINE) 10 MG tablet Take 1 tablet (10 mg total) by mouth every 6 (six) hours as needed (Nausea or vomiting).    Allergies:   Amlodipine, Hctz [hydrochlorothiazide], Heparin, Lmw heparin, Other, Sulfa antibiotics, Dairy aid [lactase], and Hydralazine   Social History   Socioeconomic History  . Marital status: Married    Spouse name: clarence  . Number of children: Not on file  . Years of education: Not on file  . Highest education level: Not on file  Occupational  History    Comment: works nights  Tobacco Use  . Smoking status: Never Smoker  . Smokeless tobacco: Never Used  Vaping Use  . Vaping Use: Never used  Substance and Sexual Activity  . Alcohol use: No  . Drug use: Never  . Sexual activity: Not Currently  Other Topics Concern  . Not on file  Social History Narrative  . Not on file   Social Determinants of Health   Financial Resource Strain:   . Difficulty of Paying Living Expenses:   Food Insecurity:   . Worried About Charity fundraiser in the Last Year:   . Arboriculturist in the Last Year:   Transportation Needs:   . Film/video editor (Medical):   Marland Kitchen Lack of Transportation (Non-Medical):   Physical Activity:   . Days of Exercise per Week:   . Minutes of Exercise per Session:   Stress:   . Feeling of Stress :   Social Connections:   . Frequency of Communication with Friends and Family:   . Frequency of Social Gatherings with Friends and Family:   . Attends Religious Services:   . Active Member of Clubs or Organizations:   .  Attends Archivist Meetings:   Marland Kitchen Marital Status:      Family History:  The patient's family history includes Cancer in her father; Diabetes in her maternal grandfather, maternal grandmother, paternal grandfather, paternal grandmother, and sister; Gout in her mother; Hypertension in her mother; Stroke in her mother.  ROS:   Review of Systems  Constitutional: Negative for chills, diaphoresis, fever, malaise/fatigue and weight loss.  HENT: Negative for congestion.   Eyes: Negative for discharge and redness.  Respiratory: Negative for cough, sputum production, shortness of breath and wheezing.   Cardiovascular: Negative for chest pain, palpitations, orthopnea, claudication, leg swelling and PND.  Gastrointestinal: Negative for abdominal pain, blood in stool, heartburn, melena, nausea and vomiting.  Musculoskeletal: Negative for falls and myalgias.  Skin: Negative for rash.    Neurological: Negative for dizziness, tingling, tremors, sensory change, speech change, focal weakness, loss of consciousness and weakness.  Endo/Heme/Allergies: Does not bruise/bleed easily.  Psychiatric/Behavioral: Negative for substance abuse. The patient is not nervous/anxious.   All other systems reviewed and are negative.    EKGs/Labs/Other Studies Reviewed:    Studies reviewed were summarized above. The additional studies were reviewed today:  Dobutamine stress echo 12/2019 [facility]): Summary   1. Pharmacologic stress echocardiogram is normal.   Stress Echo Findings  Left Ventricle   Normal left venticular systolic function with no regional wall motion  abnormalities noted at rest. Resting LVEF = 50-55%.   No regional wall motion abnormalities noted post stress.   Normal augmentation of all wall segments without evidence of ischemia with  stress. Peak stress LVEF = 65-70%.  __________  2D echo 10/2019: 1. Left ventricular ejection fraction, by estimation, is 60 to 65%. The  left ventricle has normal function. The left ventricle has no regional  wall motion abnormalities. There is moderate concentric left ventricular  hypertrophy. Left ventricular  diastolic parameters are consistent with Grade I diastolic dysfunction  (impaired relaxation).  2. Right ventricular systolic function is normal. The right ventricular  size is normal. There is normal pulmonary artery systolic pressure.  3. The mitral valve is normal in structure and function. Trivial mitral  valve regurgitation. No evidence of mitral stenosis.  4. The aortic valve is normal in structure and function. Aortic valve  regurgitation is not visualized. No aortic stenosis is present.  5. The inferior vena cava is normal in size with greater than 50%  respiratory variability, suggesting right atrial pressure of 3 mmHg.  EKG:  EKG is ordered today.  The EKG ordered today demonstrates NSR, 77 bpm, rare PVC, LVH,  poor R wave progression along the precordial leads, no acute st/t changes  Recent Labs: 11/04/2019: TSH 1.524 03/11/2020: ALT 16; BUN 48; Creatinine, Ser 10.57; Hemoglobin 12.9; Platelets 304; Potassium 3.2; Sodium 134  Recent Lipid Panel    Component Value Date/Time   CHOL 122 12/11/2013 0405   TRIG 104 12/11/2013 0405   HDL 43 12/11/2013 0405   VLDL 21 12/11/2013 0405   LDLCALC 58 12/11/2013 0405    PHYSICAL EXAM:    VS:  BP (!) 182/84 (BP Location: Left Arm, Patient Position: Sitting, Cuff Size: Normal)   Pulse 77   Ht 5\' 3"  (1.6 m)   Wt 159 lb (72.1 kg)   BMI 28.17 kg/m   BMI: Body mass index is 28.17 kg/m.  Physical Exam Constitutional:      Appearance: She is well-developed.  HENT:     Head: Normocephalic and atraumatic.  Eyes:  General:        Right eye: No discharge.        Left eye: No discharge.  Neck:     Vascular: No JVD.  Cardiovascular:     Rate and Rhythm: Normal rate and regular rhythm.     Pulses: No midsystolic click and no opening snap.          Posterior tibial pulses are 1+ on the right side and 1+ on the left side.     Heart sounds: Normal heart sounds, S1 normal and S2 normal. Heart sounds not distant. No murmur heard.  No friction rub.  Pulmonary:     Effort: Pulmonary effort is normal. No respiratory distress.     Breath sounds: Normal breath sounds. No decreased breath sounds, wheezing or rales.  Chest:     Chest wall: No tenderness.  Abdominal:     General: There is no distension.     Palpations: Abdomen is soft.     Tenderness: There is no abdominal tenderness.  Musculoskeletal:     Cervical back: Normal range of motion.  Skin:    General: Skin is warm and dry.     Nails: There is no clubbing.  Neurological:     Mental Status: She is alert and oriented to person, place, and time.  Psychiatric:        Speech: Speech normal.        Behavior: Behavior normal.        Thought Content: Thought content normal.        Judgment:  Judgment normal.     Wt Readings from Last 3 Encounters:  03/30/20 159 lb (72.1 kg)  03/20/20 154 lb 1.6 oz (69.9 kg)  03/19/20 154 lb 14.4 oz (70.3 kg)     ASSESSMENT & PLAN:   1. Preoperative cardiac evaluation: She is scheduled to undergo conization of the cervix on 04/08/2020.  Echo from 10/2019 showed preserved LVSF as outlined above.  Recent dobutamine stress echo in 12/2019 [facility]) showed preserved LVSF with no evidence of ischemia, and was overall a normal study.  Per Revised Cardiac Index, she is low risk for noncardiac surgery.  She is able to achieve >4 METs per the [facility] Activity Status Index without cardiac limitation.  Recommend maintaining potassium at goal of 4.0 in the perioperative timeframe.  No further cardiac testing is needed at this time if needed proceed with noncardiac surgery at an overall low risk.  2. Hypertension with orthostatic hypotension: Blood pressure is mildly elevated in the office today though at home BP has been well controlled.  Continue current dose carvedilol and diltiazem.  Close monitoring of her BP in the perioperative time frame will be needed.    3. PAD: Vascular surgery has previously provided recommendations regarding clopidogrel in the perioperative timeframe.  Further management deferred to them.  4. ESRD with hyperkalemia: Dialysis and management per nephrology.  5. COVID-19: Appears to have recovered well.  Subsequent echo in 12/2019 did not demonstrate a cardiomyopathy.    Disposition: F/u with Dr. Fletcher Anon or an APP in 6 months.   Medication Adjustments/Labs and Tests Ordered: Current medicines are reviewed at length with the patient today.  Concerns regarding medicines are outlined above. Medication changes, Labs and Tests ordered today are summarized above and listed in the Patient Instructions accessible in Encounters.   Signed, Christell Faith, PA-C 03/30/2020 11:22 AM     [address] [place], [place] [address] [phone] 

## 2020-03-31 ENCOUNTER — Ambulatory Visit: Payer: BC Managed Care – PPO

## 2020-03-31 DIAGNOSIS — C530 Malignant neoplasm of endocervix: Secondary | ICD-10-CM | POA: Insufficient documentation

## 2020-03-31 NOTE — Progress Notes (Signed)
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 female with ESRD on peritoneal dialysis who is seen in consultation from Tracy Jennings for cervical adenocarcinoma. She returns to clinic today for discussion of surgery. \  12/26/19- ASC-H pap Tracy Holmes, MD.   03/10/20- Colposcopy  Pathology:   Specimen A_ Cervical Biopsy 10:00 Chronic cervicitis with squamous metaplasia and atypical glands suspicious for adenocarcinoma  Specimen B- Cervical biopsy, 6:00: invasive adenocarcinoma  Specimen C- Endocervical Curettings: Invasive adenocarcinoma  Comment: Definitive origin of tumor is not clear. Clinical correlation recommended.   Tracy. Fransisca Jennings discussed with pathologist at [facility] who felt cancer is well differentiated and could not confirm that there was more than 3 mm of invasion on biopsies. Report amended.  PET PET scan report shows some FDG activity in the cervix (none in uterus), but no prominent cervical mass.  Some PET activity in pelvic nodes, but not above blood pool and no other primary or metastatic disease noted.   Tracy. Fransisca Jennings discussed with patient. Based on the above imaging and path review, cone biopsy (and D&C) to confirm that there is more than 3 mm of invasive cancer in the cervix and no endometrial primary was recommended. If cancer is confined to the cervix and invasion is < 46mm, this could be treated with a large cone biopsy  with negative margins. If she is proven to have a cervical cancer with > 3 mm of invasion or LVSI will move ahead with plan for chemoradiation.  If she has a lesser extent of disease (stage IA1), cone biopsy with clear margins may be sufficient, and it would be better to avoid the potential toxicity of chemoradiation, especially in view of her existing vascular disease.   History significant for significant vascular disease with end stage renal disease, on peritoneal dialysis and carotid stenosis. She is on transplant list at [facility]. On Plavix/asa for thrombosis prophylaxis. She had a thrombosis of her AVF fistula in 2019 after this was placed and had cardiac arrest post thrombectomy.    She returns to the clinic today to discuss plan for surgery. She was seen by cardiology on 03/30/2020 and felt to be doing well from cardiac perspective and given clearance for surgery. Echo in 10/2019 revealed EF 60-65%. EKG 03/30/20 demonstrated NSR, 77 bpm, rare PVC, lVH, poor R wave progression along precordial leads, no acute st/t changes. Blood pressure is therapeutically elevated due to history of orthostatic hypotension. She is felt to be low risk for noncardiac surgery. Vascular surgery recommended holding clopidogrel for 5 days prior to surgery and for 2 days post operatively. She continues peritoneal dialysis.   HIV 10/29/19- nonreactive   Gynecology Oncology History Tracy Jennings is a pleasant female with ESRD on peritoneal dialysis who is seen in consultation from Tracy Jennings for cervical adenocarcinoma. She was initially seen on 03/11/2020 at the [facility] and plans were made to get a PET/CT to evaluate origin of the cancer (uterine vs. cervical origin).  PAP: Atypical squamous cells cannot exclude HSIL (ASC-H) by:   Prior treatment for cervical/vaginal vulvar dysplasia: no (last PAP normal 4 years ago per patient) HIV positive: no Smoker: no Contraception: postmenopausal   On exam Vagina: Normal,  well-estrogenized vaginal mucosa, no evidence of vaginal neoplasia Cervix: No overt carcinoma, well-visualized   Colposcopic exam: TZ not seen entirely, AWE not seen. No lesions seen ECC was done, random cervical biopsies were taken  Pathology 10:00 - suspicious for adenocarcinoma 6:00, ECC - adenocarcinoma  Menopause age 72.  No bleeding since.  No pelvic pain.   UTI a month ago and some vaginal discharge.   On peritoneal dialysis for about a year and stopped working. Takes iron shots for anemia.  Patient hospitalized three days for mild COVID19 pneumonia in February 2021 2/21 Carotid US 1. Bilateral carotid bifurcation plaque resulting in less than 50% diameter ICA stenosis. 2. Antegrade bilateral vertebral arterial flow.  Cardiac Echo EF 60-65%    Oncology History  Cervical cancer ([place])  03/11/2020 Initial Diagnosis   Cervical cancer ([place])   03/30/2020 -  Chemotherapy   The patient had palonosetron (ALOXI) injection 0.25 mg, 0.25 mg, Intravenous,  Once, 0 of 7 cycles CISplatin (PLATINOL) 35 mg in sodium chloride 0.9 % 250 mL chemo infusion, 20 mg/m2 = 35 mg (100 % of original dose 20 mg/m2), Intravenous,  Once, 0 of 7 cycles Dose modification: 20 mg/m2 (original dose 20 mg/m2, Cycle 1, Reason: Other (see comments)) fosaprepitant (EMEND) 150 mg in sodium chloride 0.9 % 145 mL IVPB, 150 mg, Intravenous,  Once, 0 of 7 cycles  for chemotherapy treatment.      Problem List: Patient Active Problem List   Diagnosis Date Noted  . Goals of care, counseling/discussion 03/20/2020  . Cervical cancer ([place]) 03/11/2020  . Orthostatic hypotension   . Syncope 11/04/2019  . 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 Type 2 diabetes mellitus with other specified complication ([place]) 50/27/7412  . Peritoneal dialysis status ([place]) 11/03/2019  . Unable to care for self 11/03/2019  . Supine hypertension 11/03/2019  . Metabolic acidosis, increased anion gap (IAG)   .  COVID-19   . Generalized weakness   . Recurrent syncope 10/28/2019  . 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 Complication of vascular access for dialysis 05/11/2018  . Respiratory arrest ([place]) 05/11/2018  . Hyperlipidemia 04/12/2018  . Pancreatitis, recurrent 11/01/2017  . HTN (hypertension) 11/01/2017  . ESRD (end stage renal disease) ([place]) 11/01/2017  . Recurrent pancreatitis 11/01/2017    Past Medical History: Past Medical History:  Diagnosis Date  . Anemia 04/2018   low iron. to be started on supplements  . Cervical cancer ([place])   . CKD (chronic kidney disease)    Stage IV  . COVID-19 virus detected 10/28/2019  . Diabetes mellitus without complication ([place])    type II  . ESRD (end stage renal disease) ([place])   . Heart murmur    followed as a child only  . HSIL (high grade squamous intraepithelial lesion) on Pap smear of cervix   . Hyperlipidemia associated with type 2 diabetes mellitus ([place])   . Hypertension   . Pancreatitis   . Peripheral vascular disease [facility])     Past Surgical History: Past Surgical History:  Procedure Laterality Date  . AMPUTATION TOE Left 2013   2nd toe. tip of toe (toe nail was infected)  . AV FISTULA PLACEMENT Left 05/11/2018   Procedure: ARTERIOVENOUS (AV) FISTULA CREATION;  Surgeon: Katha Cabal,  MD;  Location: [facility] ORS;  Service: Vascular;  Laterality: Left;  . CATARACT EXTRACTION    . CHOLECYSTECTOMY  2014  . COLONOSCOPY    . COLONOSCOPY WITH PROPOFOL N/A 01/10/2018   Procedure: COLONOSCOPY WITH PROPOFOL;  Surgeon: Toledo, Benay Pike, MD;  Location: [facility] ENDOSCOPY;  Service: Gastroenterology;  Laterality: N/A;  . DIALYSIS/PERMA CATHETER INSERTION N/A 05/21/2018   Procedure: DIALYSIS/PERMA CATHETER INSERTION;  Surgeon: Algernon Huxley, MD;  Location: [place] CV LAB;  Service: Cardiovascular;  Laterality: N/A;  . DIALYSIS/PERMA CATHETER REMOVAL N/A 04/11/2019   Procedure: DIALYSIS/PERMA CATHETER REMOVAL;  Surgeon:  Algernon Huxley, MD;  Location: [place] [facility] LAB;  Service: Cardiovascular;  Laterality: N/A;  . EYE SURGERY Bilateral 2018   cataract extractions  . THROMBECTOMY W/ EMBOLECTOMY  05/11/2018   Procedure: THROMBECTOMY ARTERIOVENOUS FISTULA;  Surgeon: Katha Cabal, MD;  Location: [facility] ORS;  Service: Vascular;;  . TUBAL LIGATION  1984   Family History: Family History  Problem Relation Age of Onset  . Stroke Mother   . Hypertension Mother   . Gout Mother   . Cancer Father   . Diabetes Sister   . Diabetes Maternal Grandmother   . Diabetes Maternal Grandfather   . Diabetes Paternal Grandmother   . Diabetes Paternal Grandfather     Social History: Social History   Socioeconomic History  . Marital status: Married    Spouse name: clarence  . Number of children: Not on file  . Years of education: Not on file  . Highest education level: Not on file  Occupational History    Comment: works nights  Tobacco Use  . Smoking status: Never Smoker  . Smokeless tobacco: Never Used  Vaping Use  . Vaping Use: Never used  Substance and Sexual Activity  . Alcohol use: No  . Drug use: Never  . Sexual activity: Not Currently  Other Topics Concern  . Not on file  Social History Narrative  . Not on file   Social Determinants of Health   Financial Resource Strain:   . Difficulty of Paying Living Expenses:   Food Insecurity:   . Worried About Charity fundraiser in the Last Year:   . Arboriculturist in the Last Year:   Transportation Needs:   . Film/video editor (Medical):   Marland Kitchen Lack of Transportation (Non-Medical):   Physical Activity:   . Days of Exercise per Week:   . Minutes of Exercise per Session:   Stress:   . Feeling of Stress :   Social Connections:   . Frequency of Communication with Friends and Family:   . Frequency of Social Gatherings with Friends and Family:   . Attends Religious Services:   . Active Member of Clubs or Organizations:   . Attends Theatre manager Meetings:   Marland Kitchen Marital Status:   Intimate Partner Violence:   . Fear of Current or Ex-Partner:   . Emotionally Abused:   Marland Kitchen Physically Abused:   . Sexually Abused:     Allergies: Allergies  Allergen Reactions  . Amlodipine Swelling    Knees down to ankles Knees down to ankles  . Hctz [Hydrochlorothiazide]     pancreatitis  . Heparin Other (See Comments)    Pt reports cardiac arrest when given heparin  . Lmw Heparin     Reports cardiac arrest when given heparin  . Other Other (See Comments)    Reports cardiac arrest when given during surgery Reports cardiac arrest when  given during surgery Reports cardiac arrest when given during surgery   . Sulfa Antibiotics Rash  . Dairy Aid [Lactase]     Runny nose  . Hydralazine Nausea Only    Current Medications: Current Outpatient Medications  Medication Sig Dispense Refill  . calcitRIOL (ROCALTROL) 0.25 MCG capsule Take 0.25 mcg by mouth daily.    . calcium acetate (PHOSLO) 667 MG capsule Take 1,334 mg by mouth 2 (two) times daily with a meal.     . carvedilol (COREG) 25 MG tablet Take 25 mg by mouth 2 (two) times daily with a meal.    . clopidogrel (PLAVIX) 75 MG tablet Take 75 mg by mouth daily.    Marland Kitchen diltiazem (DILACOR XR) 120 MG 24 hr capsule Take 120 mg by mouth daily.    . prochlorperazine (COMPAZINE) 10 MG tablet Take 1 tablet (10 mg total) by mouth every 6 (six) hours as needed (Nausea or vomiting). 60 tablet 1   No current facility-administered medications for this visit.    Review of Systems General:  no complaints Skin: no complaints Eyes: no complaints HEENT: allergies Breasts: no complaints Pulmonary: no complaints Cardiac: chronic bilateral lower extremity edema.  Gastrointestinal: no complaints Genitourinary/Sexual: no complaints Ob/Gyn: no complaints Musculoskeletal: no complaints Hematology: no complaints Neurologic/Psych: no complaints   Objective:  Physical Examination:  BP (!) 178/82    Pulse 76   Temp 98.7 F (37.1 C)   Resp 20   Wt 158 lb 1.6 oz (71.7 kg)   SpO2 100%   BMI 28.01 kg/m     ECOG Performance Status: 2 - Symptomatic, <50% confined to bed  GENERAL: Patient is a well appearing female in no acute distress HEENT:  Sclera clear. Anicteric NODES:  Negative axillary, supraclavicular, inguinal lymph node survery LUNGS:  Clear to auscultation bilaterally HEART:  Regular rate and rhythm. Systolic murmur.  ABDOMEN:  Soft, nontender.  Peritoneal dialysis catheter in place.  EXTREMITIES:  1+ bilateral lower extremity edema. Atraumatic. No cyanosis SKIN:  Clear with no obvious rashes or skin changes.  NEURO:  Nonfocal. Well oriented.  Appropriate affect.  Pelvic exam deferred.   Lab Review CMP Latest Ref Rng & Units 03/11/2020 11/08/2019 11/07/2019  Glucose 70 - 99 mg/dL 110(H) 210(H) 275(H)  BUN 8 - 23 mg/dL 48(H) 114(H) 106(H)  Creatinine 0.44 - 1.00 mg/dL 10.57(H) 13.84(H) 12.97(H)  Sodium 135 - 145 mmol/L 134(L) 133(L) 132(L)  Potassium 3.5 - 5.1 mmol/L 3.2(L) 3.8 3.9  Chloride 98 - 111 mmol/L 91(L) 93(L) 94(L)  CO2 22 - 32 mmol/L 27 21(L) 21(L)  Calcium 8.9 - 10.3 mg/dL 8.6(L) 6.8(L) 6.8(L)  Total Protein 6.5 - 8.1 g/dL 8.4(H) - -  Total Bilirubin 0.3 - 1.2 mg/dL 0.6 - -  Alkaline Phos 38 - 126 U/L 69 - -  AST 15 - 41 U/L 19 - -  ALT 0 - 44 U/L 16 - -      Chemistry      Component Value Date/Time   NA 134 (L) 03/11/2020 1109   NA 139 06/14/2014 0543   K 3.2 (L) 03/11/2020 1109   K 3.6 06/14/2014 0543   CL 91 (L) 03/11/2020 1109   CL 105 06/14/2014 0543   CO2 27 03/11/2020 1109   CO2 29 06/14/2014 0543   BUN 48 (H) 03/11/2020 1109   BUN 21 (H) 06/14/2014 0543   CREATININE 10.57 (H) 03/11/2020 1109   CREATININE 1.04 06/14/2014 0543      Component Value Date/Time  CALCIUM 8.6 (L) 03/11/2020 1109   CALCIUM 8.4 (L) 06/14/2014 0543   ALKPHOS 69 03/11/2020 1109   ALKPHOS 110 06/12/2014 2134   AST 19 03/11/2020 1109   AST 66 (H) 06/12/2014  2134   ALT 16 03/11/2020 1109   ALT 68 (H) 06/12/2014 2134   BILITOT 0.6 03/11/2020 1109   BILITOT 0.4 06/12/2014 2134     Lab Results  Component Value Date   WBC 5.5 03/11/2020   HGB 12.9 03/11/2020   HCT 41.1 03/11/2020   MCV 91.1 03/11/2020   PLT 304 03/11/2020      Assessment:  Yadira D Koogler is a 64 y.o. female diagnosed with adenocarcinoma (no grade assigned) involving the cervix, unclear if cervical or uterine primary, but suspect cervical as this was detected by PAP and PET scan.  Cleared by cardiology for surgery.   ESRD on peritoneal dialysis for a year and being considered for transplant. Renal failure associated anemia getting IV iron.  Systolic murmer likely due to anemia with normal ECHO.   Medical co-morbidities complicating care: AODM (not on meds now, but BS high 8/11), HTN (systolic high today), ESRD on dialysis, carotid stenosis on Plavix and ASA  Plan:   Problem List Items Addressed This Visit      Genitourinary   Malignant neoplasm of endocervix [facility]) - Primary     Tracy. Fransisca Jennings review her PET and additional evaluation results. She has been cleared by cardiology for surgery. He has recommended CKC with D&C. We reviewed the consent form with her today.   Risks were discussed in detail. These include infection, anesthesia, bleeding, transfusion, wound separation, medical issues (blood clots, stroke, heart attack, fluid in the lungs, pneumonia, abnormal heart rhythm, death), allergic reaction, injury to adjacent organs (bowel, bladder, blood vessels,  Uterus (uterine perforation)). Possible need to laparoscopy.   Preop anesthesia to evaluate and obtain preop labs.   Gyn VTE Prophylaxis Algorithm  Risk factors for VTE (calculator):  Point value Risk factors  1 point each BMI 25-30 acute medical illness lung disease  2 points each age 48-74  3 points each personal/family history of VTE  5 points each  none in this category   Minor surgery (< 30 min).   Risk assessment: 3+ points; Moderate risk - SCDs. She is allergic to heparin.   Follow up in clinic in 4-6 weeks after surgery.   The patient's diagnosis, an outline of the further diagnostic and laboratory studies which will be required, the recommendation, and alternatives were discussed.  All questions were answered to the patient's satisfaction.  A total of 60 minutes were spent with the patient/family today; 60% was spent in education, counseling and coordination of care for cervical cancer.    Franki Monte, NP student  Beckey Rutter, NP  I personally had a face to face interaction and evaluated the patient jointly with the NP, Ms. Beckey Rutter and Benedetto Goad, NP student.  I have reviewed her history and available records and have performed the key portions of the physical exam including lymph node survey, abdominal exam, pelvic exam with my findings confirming those documented above by the APP.  I have discussed the case with the APP and the patient.  I agree with the above documentation, assessment and plan which was fully formulated by me.  Counseling was completed by me.   I personally saw the patient and performed a substantive portion of this encounter in conjunction with the listed APP as documented above.  [place]  Gaetana Michaelis, MD

## 2020-03-31 NOTE — Progress Notes (Signed)
Kris Hartmann, NP  Mariea Clonts D, RN She should hold for 5 days prior and restart 48 hours after       Previous Messages   ----- Message -----  From: Clent Jacks, RN  Sent: 03/27/2020  9:58 AM EDT  To: Kris Hartmann, NP  Subject: FW: Plavix                     ----- Message -----  From: Clent Jacks, RN  Sent: 03/25/2020  2:41 PM EDT  To: Katha Cabal, MD  Subject: Plavix                      Hi Dr. Delana Meyer, Dr. Fransisca Connors is wanting to perform a CONE on this patient with newly dx cervical carcinoma. It comes with a pretty high risk for bleeding during and after. I am looking for guidance on holding her Plavix? Thank you in advance

## 2020-03-31 NOTE — Patient Instructions (Signed)
Plavix (Clopidogrel)-Take your Plavix on July 22nd (Thursday), then hold it. Restart your Plavix 48 hours following your procedure       Cervical Conization Cervical conization (cone biopsy) is a procedure in which a cone-shaped portion of the cervix is cut out so that it can be looked at under a microscope. The cervix is the lowest part of the uterus. This procedure is done to check for cancer cells or cells that might turn into cancer (precancerous cells). You may have this procedure if: You have abnormal bleeding from your cervix. You had an abnormal Pap test. Something abnormal was seen on your cervix during an exam. This procedure is performed in either a health care provider's office or in an operating room. Tell a health care provider about: Any allergies you have. All medicines you are taking, including vitamins, herbs, eye drops, creams, and over-the-counter medicines. Any problems you or family members have had with anesthetic medicines. Any blood disorders you have. Any surgeries you have had. Any medical conditions you have. Your use of nicotine or tobacco products. When you normally have your menstrual period. Whether you are pregnant or may be pregnant. What are the risks? Generally, this is a safe procedure. However, problems may occur, including: Heavy bleeding for several days or weeks after the procedure. Allergic reactions to medicines or to dyes or other solutions. Increased risk of early (preterm) labor in future pregnancies. Infection. This is rare. Damage to the cervix or nearby structures or organs. This is rare. What happens before the procedure? Staying hydrated Follow instructions from your health care provider about hydration, which may include: Up to 2 hours before the procedure - you may continue to drink clear liquids, such as water, clear fruit juice, black coffee, and plain tea. Eating and drinking restrictions Follow instructions from your health  care provider about eating and drinking, which may include: 8 hours before the procedure - stop eating heavy meals or foods, such as meat, fried foods, or fatty foods. 6 hours before the procedure - stop eating light meals or foods, such as toast or cereal. 6 hours before the procedure - stop drinking milk or drinks that contain milk. 2 hours before the procedure - stop drinking clear liquids. Medicines Ask your health care provider about: Changing or stopping your regular medicines. This is especially important if you are taking diabetes medicines or blood thinners. Taking medicines such as aspirin and ibuprofen. These medicines can thin your blood. Do not take these medicines unless your health care provider tells you to take them. Taking over-the-counter medicines, vitamins, herbs, and supplements. General instructions If told by your health care provider, do not put anything in the vagina before the procedure. Do not douche, have sex, use tampons, or use any vaginal medicines before the procedure. Ask your health care provider: How your surgery site will be marked. What steps will be taken to help prevent infection. These may include: Removing hair at the surgery site. Washing skin with a germ-killing soap. Taking antibiotic medicine. Plan to have someone take you home from the hospital or clinic. If you will be going home right after the procedure, plan to have someone with you for 24 hours. You may be asked to empty your bladder and bowel right before the procedure. What happens during the procedure?     You will lie on an examining table and put your feet in footrests (stirrups). An IV will be inserted into one of your veins. You will be given  one or more of the following: A medicine to help you relax (sedative). A medicine to numb the area (local anesthetic). A medicine to make you fall asleep (general anesthetic). A medicine that numbs the cervix (cervical block). A  lubricated device called a speculum will be inserted into your vagina. It will be used to spread open the walls of the vagina so your health care provider can better see the inside of the vagina and cervix. An instrument that has a magnifying lens and a light (colposcope) will let your health care provider examine the cervix more closely. Your health care provider will apply a solution to your cervix. This turns abnormal areas a pale color. A tissue sample will be removed from the cervix using one of the following methods: The cold knife method. The tissue is cut out with a knife (scalpel). The loop electrosurgical excision procedure (LEEP) method. The tissue is cut out with a thin wire that can burn (cauterize) the tissue with an electrical current. Laser treatment method. The tissue is cut out and then cauterized with a laser beam to prevent bleeding. Your health care provider will apply a paste over the biopsy areas to help control bleeding. The tissue sample will be checked under a microscope. The procedure may vary among health care providers and hospitals. What happens after the procedure? Your blood pressure, heart rate, breathing rate, and blood oxygen level will be monitored until you leave the hospital or clinic. If you were given a local anesthetic, you will rest at the clinic or hospital until you are stable and ready to go home. If you were given a general anesthetic, you may be monitored for a longer period of time. You may have some cramping. You may have bloody discharge or light to moderate bleeding. You may have dark discharge coming from your vagina. This is from the paste used on the cervix to prevent bleeding. Summary Cervical conization is a procedure in which a cone-shaped portion of the cervix is cut out so that it can be examined under a microscope. This procedure is done to check for cancer cells or cells that might turn into cancer (precancerous cells). After the  procedure, you may have bloody discharge or light to moderate bleeding. You may also have dark discharge from the paste that was used on the cervix to prevent bleeding. This information is not intended to replace advice given to you by your health care provider. Make sure you discuss any questions you have with your health care provider. Document Revised: 02/25/2019 Document Reviewed: 02/25/2019 Elsevier Patient Education  [place].   Cervical Conization, Care After This sheet gives you information about how to care for yourself after your procedure. Your doctor may also give you more specific instructions. If you have problems or questions, contact your doctor. What can I expect after the procedure? After the procedure, it is common to have:  A groggy feeling, if you were given medicine to make you fall asleep (general anesthetic).  Cramps that feel like menstrual cramps.  Bloody discharge or light to moderate bleeding.  Dark fluid from the vagina (vaginal discharge). This fluid may look similar to coffee grounds. Follow these instructions at home: Medicines  Take over-the-counter and prescription medicines only as told by your doctor.  Do not take aspirin until your doctor says it is okay. Activity   Rest as told by your doctor.  For 7-14 days after your procedure, avoid: ? Being very active. ? Exercising. ?  Heavy lifting.  Do not lift anything that is heavier than 10 lb (4.5 kg), or the limit that you are told, until your doctor says that it is safe.  Return to your normal activities as told by your doctor. Ask your doctor what activities are safe for you. General instructions   You may eat your normal diet unless your doctor tells you not to do so.  Drink enough fluid to keep your pee (urine) pale yellow.  Do not take baths, swim, or use a hot tub until your doctor approves. Ask your doctor if you may take showers. You may only be allowed to take sponge  baths.  Do not douche, have sex, or put anything in the vagina, including tampons, until your doctor says it is okay.  Keep all follow-up visits as told by your doctor. This is important. Contact a doctor if:  You get a rash.  You are dizzy or light-headed.  You feel like you may vomit (nauseous).  You vomit.  You have fluid from your vagina that smells bad. Get help right away if:  There are bloody clumps (clots) coming from your vagina.  You have more bleeding than you would have in a normal menstrual period. For example, you soak a pad in less than 1 hour.  You have a fever.  You have more and more cramps.  You pass out (faint).  You have pain when peeing.  You have very bad pain, or your pain gets worse. Medicine does not help your pain.  You have blood in your pee. Summary  After the procedure, it is common to have cramps, some bleeding, and dark or bloody fluid from your vagina.  Do not douche, have sex, or use tampons until your doctor says it is okay.  For about 7-14 days after your procedure, try not to exercise or lift heavy objects. This information is not intended to replace advice given to you by your health care provider. Make sure you discuss any questions you have with your health care provider. Document Revised: 02/25/2019 Document Reviewed: 02/25/2019 Elsevier Patient Education  [place].   Dilation and Curettage or Vacuum Curettage  Dilation and curettage (D&C) and vacuum curettage are minor procedures. A D&C involves stretching (dilation) the cervix and scraping (curettage) the inside lining of the uterus (endometrium). During a D&C, tissue is gently scraped from the endometrium, starting from the top portion of the uterus down to the lowest part of the uterus (cervix). During a vacuum curettage, the lining and tissue in the uterus are removed with the use of gentle suction. Curettage may be performed to either diagnose or treat a problem.  As a diagnostic procedure, curettage is performed to examine tissues from the uterus. A diagnostic curettage may be done if you have:  Irregular bleeding in the uterus.  Bleeding with the development of clots.  Spotting between menstrual periods.  Prolonged menstrual periods or other abnormal bleeding.  Bleeding after menopause.  No menstrual period (amenorrhea).  A change in size and shape of the uterus.  Abnormal endometrial cells discovered during a Pap test. As a treatment procedure, curettage may be performed for the following reasons:  Removal of an IUD (intrauterine device).  Removal of retained placenta after giving birth.  Abortion.  Miscarriage.  Removal of endometrial polyps.  Removal of uncommon types of noncancerous lumps (fibroids). Tell a health care provider about:  Any allergies you have, including allergies to prescribed medicine or latex.  All  medicines you are taking, including vitamins, herbs, eye drops, creams, and over-the-counter medicines. This is especially important if you take any blood-thinning medicine. Bring a list of all of your medicines to your appointment.  Any problems you or family members have had with anesthetic medicines.  Any blood disorders you have.  Any surgeries you have had.  Your medical history and any medical conditions you have.  Whether you are pregnant or may be pregnant.  Recent vaginal infections you have had.  Recent menstrual periods, bleeding problems you have had, and what form of birth control (contraception) you use. What are the risks? Generally, this is a safe procedure. However, problems may occur, including:  Infection.  Heavy vaginal bleeding.  Allergic reactions to medicines.  Damage to the cervix or other structures or organs.  Development of scar tissue (adhesions) inside the uterus, which can cause abnormal amounts of menstrual bleeding. This may make it harder to get pregnant in the  future.  A hole (perforation) or puncture in the uterine wall. This is rare. What happens before the procedure? Staying hydrated Follow instructions from your health care provider about hydration, which may include:  Up to 2 hours before the procedure - you may continue to drink clear liquids, such as water, clear fruit juice, black coffee, and plain tea. Eating and drinking restrictions Follow instructions from your health care provider about eating and drinking, which may include:  8 hours before the procedure - stop eating heavy meals or foods such as meat, fried foods, or fatty foods.  6 hours before the procedure - stop eating light meals or foods, such as toast or cereal.  6 hours before the procedure - stop drinking milk or drinks that contain milk.  2 hours before the procedure - stop drinking clear liquids. If your health care provider told you to take your medicine(s) on the day of your procedure, take them with only a sip of water. Medicines  Ask your health care provider about: ? Changing or stopping your regular medicines. This is especially important if you are taking diabetes medicines or blood thinners. ? Taking medicines such as aspirin and ibuprofen. These medicines can thin your blood. Do not take these medicines before your procedure if your health care provider instructs you not to.  You may be given antibiotic medicine to help prevent infection. General instructions  For 24 hours before your procedure, do not: ? Douche. ? Use tampons. ? Use medicines, creams, or suppositories in the vagina. ? Have sexual intercourse.  You may be given a pregnancy test on the day of the procedure.  Plan to have someone take you home from the hospital or clinic.  You may have a blood or urine sample taken.  If you will be going home right after the procedure, plan to have someone with you for 24 hours. What happens during the procedure?  To reduce your risk of  infection: ? Your health care team will wash or sanitize their hands. ? Your skin will be washed with soap.  An IV tube will be inserted into one of your veins.  You will be given one of the following: ? A medicine that numbs the area in and around the cervix (local anesthetic). ? A medicine to make you fall asleep (general anesthetic).  You will lie down on your back, with your feet in foot rests (stirrups).  The size and position of your uterus will be checked.  A lubricated instrument (speculum or Sims  retractor) will be inserted into the back side of your vagina. The speculum will be used to hold apart the walls of your vagina so your health care provider can see your cervix.  A tool (tenaculum) will be attached to the lip of the cervix to stabilize it.  Your cervix will be softened and dilated. This may be done by: ? Taking a medicine. ? Having tapered dilators or thin rods (laminaria) or gradual widening instruments (tapered dilators) inserted into your cervix.  A small, sharp, curved instrument (curette) will be used to scrape a small amount of tissue or cells from the endometrium or cervical canal. In some cases, gentle suction is applied with the curette. The curette will then be removed. The cells will be taken to a lab for testing. The procedure may vary among health care providers and hospitals. What happens after the procedure?  You may have mild cramping, backache, pain, and light bleeding or spotting. You may pass small blood clots from your vagina.  You may have to wear compression stockings. These stockings help to prevent blood clots and reduce swelling in your legs.  Your blood pressure, heart rate, breathing rate, and blood oxygen level will be monitored until the medicines you were given have worn off. Summary  Dilation and curettage (D&C) involves stretching (dilation) the cervix and scraping (curettage) the inside lining of the uterus (endometrium).  After  the procedure, you may have mild cramping, backache, pain, and light bleeding or spotting. You may pass small blood clots from your vagina.  Plan to have someone take you home from the hospital or clinic. This information is not intended to replace advice given to you by your health care provider. Make sure you discuss any questions you have with your health care provider. Document Revised: 08/11/2017 Document Reviewed: 05/15/2016 Elsevier Patient Education  2020 [employer].

## 2020-04-01 ENCOUNTER — Encounter: Payer: Self-pay | Admitting: Obstetrics and Gynecology

## 2020-04-01 ENCOUNTER — Inpatient Hospital Stay (HOSPITAL_BASED_OUTPATIENT_CLINIC_OR_DEPARTMENT_OTHER): Payer: BC Managed Care – PPO | Admitting: Obstetrics and Gynecology

## 2020-04-01 ENCOUNTER — Other Ambulatory Visit: Payer: Self-pay

## 2020-04-01 ENCOUNTER — Ambulatory Visit: Payer: BC Managed Care – PPO

## 2020-04-01 VITALS — BP 178/82 | HR 76 | Temp 98.7°F | Resp 20 | Wt 158.1 lb

## 2020-04-01 DIAGNOSIS — C539 Malignant neoplasm of cervix uteri, unspecified: Secondary | ICD-10-CM | POA: Diagnosis not present

## 2020-04-01 DIAGNOSIS — C530 Malignant neoplasm of endocervix: Secondary | ICD-10-CM | POA: Diagnosis not present

## 2020-04-01 NOTE — H&P (Signed)
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 female with ESRD on peritoneal dialysis who is seen in consultation from Dr Leonides Schanz for cervical adenocarcinoma. She returns to clinic today for discussion of surgery. \  12/26/19- ASC-H pap [name], MD.   03/10/20- Colposcopy  Pathology:   Specimen A_ Cervical Biopsy 10:00 Chronic cervicitis with squamous metaplasia and atypical glands suspicious for adenocarcinoma  Specimen B- Cervical biopsy, 6:00: invasive adenocarcinoma  Specimen C- Endocervical Curettings: Invasive adenocarcinoma  Comment: Definitive origin of tumor is not clear. Clinical correlation recommended.   Dr. Fransisca Connors discussed with pathologist at [facility] who felt cancer is well differentiated and could not confirm that there was more than 3 mm of invasion on biopsies. Report amended.  PET PET scan report shows some FDG activity in the cervix (none in uterus), but no prominent cervical mass.  Some PET activity in pelvic nodes, but not above blood pool and no other primary or metastatic disease noted.   Dr. Fransisca Connors discussed with patient. Based on the above imaging and path review, cone biopsy (and D&C) to confirm that there is more than 3 mm of invasive cancer in the cervix and no endometrial primary was recommended. If cancer is confined to the cervix and invasion is < 11mm, this could be treated with a large cone  biopsy with negative margins. If she is proven to have a cervical cancer with > 3 mm of invasion or LVSI will move ahead with plan for chemoradiation.  If she has a lesser extent of disease (stage IA1), cone biopsy with clear margins may be sufficient, and it would be better to avoid the potential toxicity of chemoradiation, especially in view of her existing vascular disease.   History significant for significant vascular disease with end stage renal disease, on peritoneal dialysis and carotid stenosis. She is on transplant list at [facility]. On Plavix/asa for thrombosis prophylaxis. She had a thrombosis of her AVF fistula in 2019 after this was placed and had cardiac arrest post thrombectomy.    She returns to the clinic today to discuss plan for surgery. She was seen by cardiology on 03/30/2020 and felt to be doing well from cardiac perspective and given clearance for surgery. Echo in 10/2019 revealed EF 60-65%. EKG 03/30/20 demonstrated NSR, 77 bpm, rare PVC, lVH, poor R wave progression along precordial leads, no acute st/t changes. Blood pressure is therapeutically elevated due to history of orthostatic hypotension. She is felt to be low risk for noncardiac surgery. Vascular surgery recommended holding clopidogrel for 5 days prior to surgery and for 2 days post operatively. She continues peritoneal dialysis.   HIV 10/29/19- nonreactive   Gynecology Oncology History Tracy Jennings is a pleasant female with ESRD on peritoneal dialysis who is seen in consultation from Dr Leonides Schanz for cervical adenocarcinoma. She was initially seen on 03/11/2020 at [facility] and plans were made to get a PET/CT to evaluate origin of the cancer (uterine vs. cervical  origin).   PAP: Atypical squamous cells cannot exclude HSIL (ASC-H) by:   Prior treatment for cervical/vaginal vulvar dysplasia: no (last PAP normal 4 years ago per patient) HIV positive: no Smoker: no Contraception: postmenopausal   On exam Vagina:  Normal, well-estrogenized vaginal mucosa, no evidence of vaginal neoplasia Cervix: No overt carcinoma, well-visualized   Colposcopic exam: TZ not seen entirely, AWE not seen. No lesions seen ECC was done, random cervical biopsies were taken  Pathology 10:00 - suspicious for adenocarcinoma 6:00, ECC - adenocarcinoma  Menopause age 50.  No bleeding since.  No pelvic pain.   UTI a month ago and some vaginal discharge.   On peritoneal dialysis for about a year and stopped working. Takes iron shots for anemia.  Patient hospitalized three days for mild COVID19 pneumonia in February 2021 2/21 Carotid US 1. Bilateral carotid bifurcation plaque resulting in less than 50% diameter ICA stenosis. 2. Antegrade bilateral vertebral arterial flow.  Cardiac Echo EF 60-65%    Oncology History  Cervical cancer ([place])  03/11/2020 Initial Diagnosis   Cervical cancer ([place])   03/30/2020 -  Chemotherapy   The patient had palonosetron (ALOXI) injection 0.25 mg, 0.25 mg, Intravenous,  Once, 0 of 7 cycles CISplatin (PLATINOL) 35 mg in sodium chloride 0.9 % 250 mL chemo infusion, 20 mg/m2 = 35 mg (100 % of original dose 20 mg/m2), Intravenous,  Once, 0 of 7 cycles Dose modification: 20 mg/m2 (original dose 20 mg/m2, Cycle 1, Reason: Other (see comments)) fosaprepitant (EMEND) 150 mg in sodium chloride 0.9 % 145 mL IVPB, 150 mg, Intravenous,  Once, 0 of 7 cycles  for chemotherapy treatment.      Problem List: Patient Active Problem List   Diagnosis Date Noted  . Goals of care, counseling/discussion 03/20/2020  . Cervical cancer ([place]) 03/11/2020  . Orthostatic hypotension   . Syncope 11/04/2019  . 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 Type 2 diabetes mellitus with other specified complication ([place]) 63/87/5643  . Peritoneal dialysis status ([place]) 11/03/2019  . Unable to care for self 11/03/2019  . Supine hypertension 11/03/2019  . Metabolic acidosis, increased anion gap  (IAG)   . COVID-19   . Generalized weakness   . Recurrent syncope 10/28/2019  . 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 Complication of vascular access for dialysis 05/11/2018  . Respiratory arrest ([place]) 05/11/2018  . Hyperlipidemia 04/12/2018  . Pancreatitis, recurrent 11/01/2017  . HTN (hypertension) 11/01/2017  . ESRD (end stage renal disease) ([place]) 11/01/2017  . Recurrent pancreatitis 11/01/2017    Past Medical History: Past Medical History:  Diagnosis Date  . Anemia 04/2018   low iron. to be started on supplements  . Cervical cancer ([place])   . CKD (chronic kidney disease)    Stage IV  . COVID-19 virus detected 10/28/2019  . Diabetes mellitus without complication ([place])    type II  . ESRD (end stage renal disease) ([place])   . Heart murmur    followed as a child only  . HSIL (high grade squamous intraepithelial lesion) on Pap smear of cervix   . Hyperlipidemia associated with type 2 diabetes mellitus ([place])   . Hypertension   . Pancreatitis   . Peripheral vascular disease [facility])     Past Surgical History: Past Surgical History:  Procedure Laterality Date  . AMPUTATION TOE Left 2013   2nd toe. tip of toe (toe nail was infected)  . AV FISTULA PLACEMENT Left 05/11/2018   Procedure: ARTERIOVENOUS (AV) FISTULA CREATION;  Surgeon:  Schnier, Dolores Lory, MD;  Location: [facility] ORS;  Service: Vascular;  Laterality: Left;  . CATARACT EXTRACTION    . CHOLECYSTECTOMY  2014  . COLONOSCOPY    . COLONOSCOPY WITH PROPOFOL N/A 01/10/2018   Procedure: COLONOSCOPY WITH PROPOFOL;  Surgeon: Toledo, Benay Pike, MD;  Location: [facility] ENDOSCOPY;  Service: Gastroenterology;  Laterality: N/A;  . DIALYSIS/PERMA CATHETER INSERTION N/A 05/21/2018   Procedure: DIALYSIS/PERMA CATHETER INSERTION;  Surgeon: Algernon Huxley, MD;  Location: [place] [facility];  Service: Cardiovascular;  Laterality: N/A;  . DIALYSIS/PERMA CATHETER REMOVAL N/A 04/11/2019   Procedure: DIALYSIS/PERMA CATHETER REMOVAL;   Surgeon: Algernon Huxley, MD;  Location: [place] [facility];  Service: Cardiovascular;  Laterality: N/A;  . EYE SURGERY Bilateral 2018   cataract extractions  . THROMBECTOMY W/ EMBOLECTOMY  05/11/2018   Procedure: THROMBECTOMY ARTERIOVENOUS FISTULA;  Surgeon: Katha Cabal, MD;  Location: [facility] ORS;  Service: Vascular;;  . TUBAL LIGATION  1984   Family History: Family History  Problem Relation Age of Onset  . Stroke Mother   . Hypertension Mother   . Gout Mother   . Cancer Father   . Diabetes Sister   . Diabetes Maternal Grandmother   . Diabetes Maternal Grandfather   . Diabetes Paternal Grandmother   . Diabetes Paternal Grandfather     Social History: Social History   Socioeconomic History  . Marital status: Married    Spouse name: clarence  . Number of children: Not on file  . Years of education: Not on file  . Highest education level: Not on file  Occupational History    Comment: works nights  Tobacco Use  . Smoking status: Never Smoker  . Smokeless tobacco: Never Used  Vaping Use  . Vaping Use: Never used  Substance and Sexual Activity  . Alcohol use: No  . Drug use: Never  . Sexual activity: Not Currently  Other Topics Concern  . Not on file  Social History Narrative  . Not on file   Social Determinants of Health   Financial Resource Strain:   . Difficulty of Paying Living Expenses:   Food Insecurity:   . Worried About Charity fundraiser in the Last Year:   . Arboriculturist in the Last Year:   Transportation Needs:   . Film/video editor (Medical):   Marland Kitchen Lack of Transportation (Non-Medical):   Physical Activity:   . Days of Exercise per Week:   . Minutes of Exercise per Session:   Stress:   . Feeling of Stress :   Social Connections:   . Frequency of Communication with Friends and Family:   . Frequency of Social Gatherings with Friends and Family:   . Attends Religious Services:   . Active Member of Clubs or Organizations:   . Attends English as a second language teacher Meetings:   Marland Kitchen Marital Status:   Intimate Partner Violence:   . Fear of Current or Ex-Partner:   . Emotionally Abused:   Marland Kitchen Physically Abused:   . Sexually Abused:     Allergies: Allergies  Allergen Reactions  . Amlodipine Swelling    Knees down to ankles Knees down to ankles  . Hctz [Hydrochlorothiazide]     pancreatitis  . Heparin Other (See Comments)    Pt reports cardiac arrest when given heparin  . Lmw Heparin     Reports cardiac arrest when given heparin  . Other Other (See Comments)    Reports cardiac arrest when given during surgery Reports  cardiac arrest when given during surgery Reports cardiac arrest when given during surgery   . Sulfa Antibiotics Rash  . Dairy Aid [Lactase]     Runny nose  . Hydralazine Nausea Only    Current Medications: Current Outpatient Medications  Medication Sig Dispense Refill  . calcitRIOL (ROCALTROL) 0.25 MCG capsule Take 0.25 mcg by mouth daily.    . calcium acetate (PHOSLO) 667 MG capsule Take 1,334 mg by mouth 2 (two) times daily with a meal.     . carvedilol (COREG) 25 MG tablet Take 25 mg by mouth 2 (two) times daily with a meal.    . clopidogrel (PLAVIX) 75 MG tablet Take 75 mg by mouth daily.    Marland Kitchen diltiazem (DILACOR XR) 120 MG 24 hr capsule Take 120 mg by mouth daily.    . prochlorperazine (COMPAZINE) 10 MG tablet Take 1 tablet (10 mg total) by mouth every 6 (six) hours as needed (Nausea or vomiting). 60 tablet 1   No current facility-administered medications for this visit.    Review of Systems General:  no complaints Skin: no complaints Eyes: no complaints HEENT: allergies Breasts: no complaints Pulmonary: no complaints Cardiac: chronic bilateral lower extremity edema.  Gastrointestinal: no complaints Genitourinary/Sexual: no complaints Ob/Gyn: no complaints Musculoskeletal: no complaints Hematology: no complaints Neurologic/Psych: no complaints   Objective:  Physical Examination:  BP (!)  178/82   Pulse 76   Temp 98.7 F (37.1 C)   Resp 20   Wt 158 lb 1.6 oz (71.7 kg)   SpO2 100%   BMI 28.01 kg/m     ECOG Performance Status: 2 - Symptomatic, <50% confined to bed  GENERAL: Patient is a well appearing female in no acute distress HEENT:  Sclera clear. Anicteric NODES:  Negative axillary, supraclavicular, inguinal lymph node survery LUNGS:  Clear to auscultation bilaterally HEART:  Regular rate and rhythm. Systolic murmur.  ABDOMEN:  Soft, nontender.  Peritoneal dialysis catheter in place.  EXTREMITIES:  1+ bilateral lower extremity edema. Atraumatic. No cyanosis SKIN:  Clear with no obvious rashes or skin changes.  NEURO:  Nonfocal. Well oriented.  Appropriate affect.  Pelvic exam deferred.   Lab Review CMP Latest Ref Rng & Units 03/11/2020 11/08/2019 11/07/2019  Glucose 70 - 99 mg/dL 110(H) 210(H) 275(H)  BUN 8 - 23 mg/dL 48(H) 114(H) 106(H)  Creatinine 0.44 - 1.00 mg/dL 10.57(H) 13.84(H) 12.97(H)  Sodium 135 - 145 mmol/L 134(L) 133(L) 132(L)  Potassium 3.5 - 5.1 mmol/L 3.2(L) 3.8 3.9  Chloride 98 - 111 mmol/L 91(L) 93(L) 94(L)  CO2 22 - 32 mmol/L 27 21(L) 21(L)  Calcium 8.9 - 10.3 mg/dL 8.6(L) 6.8(L) 6.8(L)  Total Protein 6.5 - 8.1 g/dL 8.4(H) - -  Total Bilirubin 0.3 - 1.2 mg/dL 0.6 - -  Alkaline Phos 38 - 126 U/L 69 - -  AST 15 - 41 U/L 19 - -  ALT 0 - 44 U/L 16 - -      Chemistry      Component Value Date/Time   NA 134 (L) 03/11/2020 1109   NA 139 06/14/2014 0543   K 3.2 (L) 03/11/2020 1109   K 3.6 06/14/2014 0543   CL 91 (L) 03/11/2020 1109   CL 105 06/14/2014 0543   CO2 27 03/11/2020 1109   CO2 29 06/14/2014 0543   BUN 48 (H) 03/11/2020 1109   BUN 21 (H) 06/14/2014 0543   CREATININE 10.57 (H) 03/11/2020 1109   CREATININE 1.04 06/14/2014 0543      Component Value  Date/Time   CALCIUM 8.6 (L) 03/11/2020 1109   CALCIUM 8.4 (L) 06/14/2014 0543   ALKPHOS 69 03/11/2020 1109   ALKPHOS 110 06/12/2014 2134   AST 19 03/11/2020 1109   AST 66 (H)  06/12/2014 2134   ALT 16 03/11/2020 1109   ALT 68 (H) 06/12/2014 2134   BILITOT 0.6 03/11/2020 1109   BILITOT 0.4 06/12/2014 2134     Lab Results  Component Value Date   WBC 5.5 03/11/2020   HGB 12.9 03/11/2020   HCT 41.1 03/11/2020   MCV 91.1 03/11/2020   PLT 304 03/11/2020      Assessment:  Jordis D Folds is a 64 y.o. female diagnosed with adenocarcinoma (no grade assigned) involving the cervix, unclear if cervical or uterine primary, but suspect cervical as this was detected by PAP and PET scan.  Cleared by cardiology for surgery.   ESRD on peritoneal dialysis for a year and being considered for transplant. Renal failure associated anemia getting IV iron.  Systolic murmer likely due to anemia with normal ECHO.   Medical co-morbidities complicating care: AODM (not on meds now, but BS high 4/23), HTN (systolic high today), ESRD on dialysis, carotid stenosis on Plavix and ASA  Plan:   Problem List Items Addressed This Visit      Genitourinary   Malignant neoplasm of endocervix [facility]) - Primary     Dr. Fransisca Connors review her PET and additional evaluation results. She has been cleared by cardiology for surgery. He has recommended CKC with D&C. We reviewed the consent form with her today.   Risks were discussed in detail. These include infection, anesthesia, bleeding, transfusion, wound separation, medical issues (blood clots, stroke, heart attack, fluid in the lungs, pneumonia, abnormal heart rhythm, death), allergic reaction, injury to adjacent organs (bowel, bladder, blood vessels,  Uterus (uterine perforation)). Possible need to laparoscopy.   Preop anesthesia to evaluate and obtain preop labs.   Gyn VTE Prophylaxis Algorithm  Risk factors for VTE (calculator):  Point value Risk factors  1 point each BMI 25-30 acute medical illness lung disease  2 points each age 93-74  3 points each personal/family history of VTE  5 points each  none in this category   Minor surgery  (< 30 min).  Risk assessment: 3+ points; Moderate risk - SCDs. She is allergic to heparin.   Follow up in clinic in 4-6 weeks after surgery.   The patient's diagnosis, an outline of the further diagnostic and laboratory studies which will be required, the recommendation, and alternatives were discussed.  All questions were answered to the patient's satisfaction.  A total of 60 minutes were spent with the patient/family today; 60% was spent in education, counseling and coordination of care for cervical cancer.    Franki Monte, NP student  Beckey Rutter, NP  I personally had a face to face interaction and evaluated the patient jointly with the NP, Ms. Beckey Rutter and Benedetto Goad, NP student.  I have reviewed her history and available records and have performed the key portions of the physical exam including lymph node survey, abdominal exam, pelvic exam with my findings confirming those documented above by the APP.  I have discussed the case with the APP and the patient.  I agree with the above documentation, assessment and plan which was fully formulated by me.  Counseling was completed by me.   I personally saw the patient and performed a substantive portion of this encounter in conjunction with the listed APP as documented  above.  Sheza Strickland Gaetana Michaelis, MD

## 2020-04-01 NOTE — H&P (View-Only) (Signed)
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 female with ESRD on peritoneal dialysis who is seen in consultation from Dr Leonides Schanz for cervical adenocarcinoma. She returns to clinic today for discussion of surgery. \  12/26/19- ASC-H pap Tracy Holmes, MD.   03/10/20- Colposcopy  Pathology:   Specimen A_ Cervical Biopsy 10:00 Chronic cervicitis with squamous metaplasia and atypical glands suspicious for adenocarcinoma  Specimen B- Cervical biopsy, 6:00: invasive adenocarcinoma  Specimen C- Endocervical Curettings: Invasive adenocarcinoma  Comment: Definitive origin of tumor is not clear. Clinical correlation recommended.   Dr. Fransisca Connors discussed with pathologist at [facility] who felt cancer is well differentiated and could not confirm that there was more than 3 mm of invasion on biopsies. Report amended.  PET PET scan report shows some FDG activity in the cervix (none in uterus), but no prominent cervical mass.  Some PET activity in pelvic nodes, but not above blood pool and no other primary or metastatic disease noted.   Dr. Fransisca Connors discussed with patient. Based on the above imaging and path review, cone biopsy (and D&C) to confirm that there is more than 3 mm of invasive cancer in the cervix and no endometrial primary was recommended. If cancer is confined to the cervix and invasion is < 55mm, this could be treated with a large cone  biopsy with negative margins. If she is proven to have a cervical cancer with > 3 mm of invasion or LVSI will move ahead with plan for chemoradiation.  If she has a lesser extent of disease (stage IA1), cone biopsy with clear margins may be sufficient, and it would be better to avoid the potential toxicity of chemoradiation, especially in view of her existing vascular disease.   History significant for significant vascular disease with end stage renal disease, on peritoneal dialysis and carotid stenosis. She is on transplant list at [facility]. On Plavix/asa for thrombosis prophylaxis. She had a thrombosis of her AVF fistula in 2019 after this was placed and had cardiac arrest post thrombectomy.    She returns to the clinic today to discuss plan for surgery. She was seen by cardiology on 03/30/2020 and felt to be doing well from cardiac perspective and given clearance for surgery. Echo in 10/2019 revealed EF 60-65%. EKG 03/30/20 demonstrated NSR, 77 bpm, rare PVC, lVH, poor R wave progression along precordial leads, no acute st/t changes. Blood pressure is therapeutically elevated due to history of orthostatic hypotension. She is felt to be low risk for noncardiac surgery. Vascular surgery recommended holding clopidogrel for 5 days prior to surgery and for 2 days post operatively. She continues peritoneal dialysis.   HIV 10/29/19- nonreactive   Gynecology Oncology History Tracy Jennings is a pleasant female with ESRD on peritoneal dialysis who is seen in consultation from Dr Leonides Schanz for cervical adenocarcinoma. She was initially seen on 03/11/2020 at the [facility] and plans were made to get a PET/CT to evaluate origin of the cancer (uterine vs. cervical  origin).   PAP: Atypical squamous cells cannot exclude HSIL (ASC-H) by:   Prior treatment for cervical/vaginal vulvar dysplasia: no (last PAP normal 4 years ago per patient) HIV positive: no Smoker: no Contraception: postmenopausal   On exam Vagina:  Normal, well-estrogenized vaginal mucosa, no evidence of vaginal neoplasia Cervix: No overt carcinoma, well-visualized   Colposcopic exam: TZ not seen entirely, AWE not seen. No lesions seen ECC was done, random cervical biopsies were taken  Pathology 10:00 - suspicious for adenocarcinoma 6:00, ECC - adenocarcinoma  Menopause age 75.  No bleeding since.  No pelvic pain.   UTI a month ago and some vaginal discharge.   On peritoneal dialysis for about a year and stopped working. Takes iron shots for anemia.  Patient hospitalized three days for mild COVID19 pneumonia in February 2021 2/21 Carotid US 1. Bilateral carotid bifurcation plaque resulting in less than 50% diameter ICA stenosis. 2. Antegrade bilateral vertebral arterial flow.  Cardiac Echo EF 60-65%    Oncology History  Cervical cancer ([place])  03/11/2020 Initial Diagnosis   Cervical cancer ([place])   03/30/2020 -  Chemotherapy   The patient had palonosetron (ALOXI) injection 0.25 mg, 0.25 mg, Intravenous,  Once, 0 of 7 cycles CISplatin (PLATINOL) 35 mg in sodium chloride 0.9 % 250 mL chemo infusion, 20 mg/m2 = 35 mg (100 % of original dose 20 mg/m2), Intravenous,  Once, 0 of 7 cycles Dose modification: 20 mg/m2 (original dose 20 mg/m2, Cycle 1, Reason: Other (see comments)) fosaprepitant (EMEND) 150 mg in sodium chloride 0.9 % 145 mL IVPB, 150 mg, Intravenous,  Once, 0 of 7 cycles  for chemotherapy treatment.      Problem List: Patient Active Problem List   Diagnosis Date Noted  . Goals of care, counseling/discussion 03/20/2020  . Cervical cancer ([place]) 03/11/2020  . Orthostatic hypotension   . Syncope 11/04/2019  . 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 Type 2 diabetes mellitus with other specified complication ([place]) 23/55/7322  . Peritoneal dialysis status ([place]) 11/03/2019  . Unable to care for self 11/03/2019  . Supine hypertension 11/03/2019  . Metabolic acidosis, increased anion gap  (IAG)   . COVID-19   . Generalized weakness   . Recurrent syncope 10/28/2019  . 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 Complication of vascular access for dialysis 05/11/2018  . Respiratory arrest ([place]) 05/11/2018  . Hyperlipidemia 04/12/2018  . Pancreatitis, recurrent 11/01/2017  . HTN (hypertension) 11/01/2017  . ESRD (end stage renal disease) ([place]) 11/01/2017  . Recurrent pancreatitis 11/01/2017    Past Medical History: Past Medical History:  Diagnosis Date  . Anemia 04/2018   low iron. to be started on supplements  . Cervical cancer ([place])   . CKD (chronic kidney disease)    Stage IV  . COVID-19 virus detected 10/28/2019  . Diabetes mellitus without complication ([place])    type II  . ESRD (end stage renal disease) ([place])   . Heart murmur    followed as a child only  . HSIL (high grade squamous intraepithelial lesion) on Pap smear of cervix   . Hyperlipidemia associated with type 2 diabetes mellitus ([place])   . Hypertension   . Pancreatitis   . Peripheral vascular disease [facility])     Past Surgical History: Past Surgical History:  Procedure Laterality Date  . AMPUTATION TOE Left 2013   2nd toe. tip of toe (toe nail was infected)  . AV FISTULA PLACEMENT Left 05/11/2018   Procedure: ARTERIOVENOUS (AV) FISTULA CREATION;  Surgeon:  Schnier, Dolores Lory, MD;  Location: [facility] ORS;  Service: Vascular;  Laterality: Left;  . CATARACT EXTRACTION    . CHOLECYSTECTOMY  2014  . COLONOSCOPY    . COLONOSCOPY WITH PROPOFOL N/A 01/10/2018   Procedure: COLONOSCOPY WITH PROPOFOL;  Surgeon: Toledo, Benay Pike, MD;  Location: [facility] ENDOSCOPY;  Service: Gastroenterology;  Laterality: N/A;  . DIALYSIS/PERMA CATHETER INSERTION N/A 05/21/2018   Procedure: DIALYSIS/PERMA CATHETER INSERTION;  Surgeon: Algernon Huxley, MD;  Location: [place] CV LAB;  Service: Cardiovascular;  Laterality: N/A;  . DIALYSIS/PERMA CATHETER REMOVAL N/A 04/11/2019   Procedure: DIALYSIS/PERMA CATHETER REMOVAL;   Surgeon: Algernon Huxley, MD;  Location: [place] CV LAB;  Service: Cardiovascular;  Laterality: N/A;  . EYE SURGERY Bilateral 2018   cataract extractions  . THROMBECTOMY W/ EMBOLECTOMY  05/11/2018   Procedure: THROMBECTOMY ARTERIOVENOUS FISTULA;  Surgeon: Katha Cabal, MD;  Location: [facility] ORS;  Service: Vascular;;  . TUBAL LIGATION  1984   Family History: Family History  Problem Relation Age of Onset  . Stroke Mother   . Hypertension Mother   . Gout Mother   . Cancer Father   . Diabetes Sister   . Diabetes Maternal Grandmother   . Diabetes Maternal Grandfather   . Diabetes Paternal Grandmother   . Diabetes Paternal Grandfather     Social History: Social History   Socioeconomic History  . Marital status: Married    Spouse name: clarence  . Number of children: Not on file  . Years of education: Not on file  . Highest education level: Not on file  Occupational History    Comment: works nights  Tobacco Use  . Smoking status: Never Smoker  . Smokeless tobacco: Never Used  Vaping Use  . Vaping Use: Never used  Substance and Sexual Activity  . Alcohol use: No  . Drug use: Never  . Sexual activity: Not Currently  Other Topics Concern  . Not on file  Social History Narrative  . Not on file   Social Determinants of Health   Financial Resource Strain:   . Difficulty of Paying Living Expenses:   Food Insecurity:   . Worried About Charity fundraiser in the Last Year:   . Arboriculturist in the Last Year:   Transportation Needs:   . Film/video editor (Medical):   Marland Kitchen Lack of Transportation (Non-Medical):   Physical Activity:   . Days of Exercise per Week:   . Minutes of Exercise per Session:   Stress:   . Feeling of Stress :   Social Connections:   . Frequency of Communication with Friends and Family:   . Frequency of Social Gatherings with Friends and Family:   . Attends Religious Services:   . Active Member of Clubs or Organizations:   . Attends English as a second language teacher Meetings:   Marland Kitchen Marital Status:   Intimate Partner Violence:   . Fear of Current or Ex-Partner:   . Emotionally Abused:   Marland Kitchen Physically Abused:   . Sexually Abused:     Allergies: Allergies  Allergen Reactions  . Amlodipine Swelling    Knees down to ankles Knees down to ankles  . Hctz [Hydrochlorothiazide]     pancreatitis  . Heparin Other (See Comments)    Pt reports cardiac arrest when given heparin  . Lmw Heparin     Reports cardiac arrest when given heparin  . Other Other (See Comments)    Reports cardiac arrest when given during surgery Reports  cardiac arrest when given during surgery Reports cardiac arrest when given during surgery   . Sulfa Antibiotics Rash  . Dairy Aid [Lactase]     Runny nose  . Hydralazine Nausea Only    Current Medications: Current Outpatient Medications  Medication Sig Dispense Refill  . calcitRIOL (ROCALTROL) 0.25 MCG capsule Take 0.25 mcg by mouth daily.    . calcium acetate (PHOSLO) 667 MG capsule Take 1,334 mg by mouth 2 (two) times daily with a meal.     . carvedilol (COREG) 25 MG tablet Take 25 mg by mouth 2 (two) times daily with a meal.    . clopidogrel (PLAVIX) 75 MG tablet Take 75 mg by mouth daily.    Marland Kitchen diltiazem (DILACOR XR) 120 MG 24 hr capsule Take 120 mg by mouth daily.    . prochlorperazine (COMPAZINE) 10 MG tablet Take 1 tablet (10 mg total) by mouth every 6 (six) hours as needed (Nausea or vomiting). 60 tablet 1   No current facility-administered medications for this visit.    Review of Systems General:  no complaints Skin: no complaints Eyes: no complaints HEENT: allergies Breasts: no complaints Pulmonary: no complaints Cardiac: chronic bilateral lower extremity edema.  Gastrointestinal: no complaints Genitourinary/Sexual: no complaints Ob/Gyn: no complaints Musculoskeletal: no complaints Hematology: no complaints Neurologic/Psych: no complaints   Objective:  Physical Examination:  BP (!)  178/82   Pulse 76   Temp 98.7 F (37.1 C)   Resp 20   Wt 158 lb 1.6 oz (71.7 kg)   SpO2 100%   BMI 28.01 kg/m     ECOG Performance Status: 2 - Symptomatic, <50% confined to bed  GENERAL: Patient is a well appearing female in no acute distress HEENT:  Sclera clear. Anicteric NODES:  Negative axillary, supraclavicular, inguinal lymph node survery LUNGS:  Clear to auscultation bilaterally HEART:  Regular rate and rhythm. Systolic murmur.  ABDOMEN:  Soft, nontender.  Peritoneal dialysis catheter in place.  EXTREMITIES:  1+ bilateral lower extremity edema. Atraumatic. No cyanosis SKIN:  Clear with no obvious rashes or skin changes.  NEURO:  Nonfocal. Well oriented.  Appropriate affect.  Pelvic exam deferred.   Lab Review CMP Latest Ref Rng & Units 03/11/2020 11/08/2019 11/07/2019  Glucose 70 - 99 mg/dL 110(H) 210(H) 275(H)  BUN 8 - 23 mg/dL 48(H) 114(H) 106(H)  Creatinine 0.44 - 1.00 mg/dL 10.57(H) 13.84(H) 12.97(H)  Sodium 135 - 145 mmol/L 134(L) 133(L) 132(L)  Potassium 3.5 - 5.1 mmol/L 3.2(L) 3.8 3.9  Chloride 98 - 111 mmol/L 91(L) 93(L) 94(L)  CO2 22 - 32 mmol/L 27 21(L) 21(L)  Calcium 8.9 - 10.3 mg/dL 8.6(L) 6.8(L) 6.8(L)  Total Protein 6.5 - 8.1 g/dL 8.4(H) - -  Total Bilirubin 0.3 - 1.2 mg/dL 0.6 - -  Alkaline Phos 38 - 126 U/L 69 - -  AST 15 - 41 U/L 19 - -  ALT 0 - 44 U/L 16 - -      Chemistry      Component Value Date/Time   NA 134 (L) 03/11/2020 1109   NA 139 06/14/2014 0543   K 3.2 (L) 03/11/2020 1109   K 3.6 06/14/2014 0543   CL 91 (L) 03/11/2020 1109   CL 105 06/14/2014 0543   CO2 27 03/11/2020 1109   CO2 29 06/14/2014 0543   BUN 48 (H) 03/11/2020 1109   BUN 21 (H) 06/14/2014 0543   CREATININE 10.57 (H) 03/11/2020 1109   CREATININE 1.04 06/14/2014 0543      Component Value  Date/Time   CALCIUM 8.6 (L) 03/11/2020 1109   CALCIUM 8.4 (L) 06/14/2014 0543   ALKPHOS 69 03/11/2020 1109   ALKPHOS 110 06/12/2014 2134   AST 19 03/11/2020 1109   AST 66 (H)  06/12/2014 2134   ALT 16 03/11/2020 1109   ALT 68 (H) 06/12/2014 2134   BILITOT 0.6 03/11/2020 1109   BILITOT 0.4 06/12/2014 2134     Lab Results  Component Value Date   WBC 5.5 03/11/2020   HGB 12.9 03/11/2020   HCT 41.1 03/11/2020   MCV 91.1 03/11/2020   PLT 304 03/11/2020      Assessment:  Kimbria D Eugene is a 64 y.o. female diagnosed with adenocarcinoma (no grade assigned) involving the cervix, unclear if cervical or uterine primary, but suspect cervical as this was detected by PAP and PET scan.  Cleared by cardiology for surgery.   ESRD on peritoneal dialysis for a year and being considered for transplant. Renal failure associated anemia getting IV iron.  Systolic murmer likely due to anemia with normal ECHO.   Medical co-morbidities complicating care: AODM (not on meds now, but BS high 7/42), HTN (systolic high today), ESRD on dialysis, carotid stenosis on Plavix and ASA  Plan:   Problem List Items Addressed This Visit      Genitourinary   Malignant neoplasm of endocervix [facility]) - Primary     Dr. Fransisca Connors review her PET and additional evaluation results. She has been cleared by cardiology for surgery. He has recommended CKC with D&C. We reviewed the consent form with her today.   Risks were discussed in detail. These include infection, anesthesia, bleeding, transfusion, wound separation, medical issues (blood clots, stroke, heart attack, fluid in the lungs, pneumonia, abnormal heart rhythm, death), allergic reaction, injury to adjacent organs (bowel, bladder, blood vessels,  Uterus (uterine perforation)). Possible need to laparoscopy.   Preop anesthesia to evaluate and obtain preop labs.   Gyn VTE Prophylaxis Algorithm  Risk factors for VTE (calculator):  Point value Risk factors  1 point each BMI 25-30 acute medical illness lung disease  2 points each age 35-74  3 points each personal/family history of VTE  5 points each  none in this category   Minor surgery  (< 30 min).  Risk assessment: 3+ points; Moderate risk - SCDs. She is allergic to heparin.   Follow up in clinic in 4-6 weeks after surgery.   The patient's diagnosis, an outline of the further diagnostic and laboratory studies which will be required, the recommendation, and alternatives were discussed.  All questions were answered to the patient's satisfaction.  A total of 60 minutes were spent with the patient/family today; 60% was spent in education, counseling and coordination of care for cervical cancer.    Franki Monte, NP student  Beckey Rutter, NP  I personally had a face to face interaction and evaluated the patient jointly with the NP, Ms. Beckey Rutter and Benedetto Goad, NP student.  I have reviewed her history and available records and have performed the key portions of the physical exam including lymph node survey, abdominal exam, pelvic exam with my findings confirming those documented above by the APP.  I have discussed the case with the APP and the patient.  I agree with the above documentation, assessment and plan which was fully formulated by me.  Counseling was completed by me.   I personally saw the patient and performed a substantive portion of this encounter in conjunction with the listed APP as documented  above.  Trecia Maring Gaetana Michaelis, MD

## 2020-04-02 ENCOUNTER — Ambulatory Visit: Payer: BC Managed Care – PPO

## 2020-04-03 ENCOUNTER — Other Ambulatory Visit: Payer: Self-pay

## 2020-04-03 ENCOUNTER — Ambulatory Visit: Payer: BC Managed Care – PPO

## 2020-04-03 ENCOUNTER — Encounter
Admission: RE | Admit: 2020-04-03 | Discharge: 2020-04-03 | Disposition: A | Discharge status: Home / Self Care | Payer: BC Managed Care – PPO | Source: Ambulatory Visit | Attending: Obstetrics and Gynecology | Admitting: Obstetrics and Gynecology

## 2020-04-03 HISTORY — DX: Other complications of anesthesia, initial encounter: T88.59XA

## 2020-04-03 NOTE — Patient Instructions (Signed)
Your procedure is scheduled on: Wednesday April 08, 2020. Report to Day Surgery inside [facility] 2nd floor. To find out your arrival time please call [phone] between 1PM - 3PM on Tuesday April 07, 2020.  Remember: Instructions that are not followed completely may result in serious medical risk,  up to and including death, or upon the discretion of your surgeon and anesthesiologist your  surgery may need to be rescheduled.     _X__ 1. Do not eat food after midnight the night before your procedure.                 No gum chewing or hard candies. You may drink clear liquids up to 2 hours                 before you are scheduled to arrive for your surgery- DO not drink clear                 liquids within 2 hours of the start of your surgery.                 Clear Liquids include:  water, apple juice without pulp, clear Gatorade, G2 or                  Gatorade Zero (avoid Red/Purple/Blue), Black Coffee or Tea (Do not add                 anything to coffee or tea).  __X__ 2. Gatorade G2 Finish Drink 2 hours prior to your arrival Time at the hospital the day of your surgery  __X__3.  On the morning of surgery brush your teeth with toothpaste and water, you                may rinse your mouth with mouthwash if you wish.  Do not swallow any toothpaste of mouthwash.     _X__ 4.  No Alcohol for 24 hours before or after surgery.   _X__ 5.  Do Not Smoke or use e-cigarettes For 24 Hours Prior to Your Surgery.                 Do not use any chewable tobacco products for at least 6 hours prior to                 Surgery.  _X__  6.  Do not use any recreational drugs (marijuana, cocaine, heroin, ecstacy, MDMA or other)                For at least one week prior to your surgery.  Combination of these drugs with anesthesia                May have life threatening results.  __X__ 7.  Notify your doctor if there is any change in your medical condition      (cold, fever,  infections).     Do not wear jewelry, make-up, hairpins, clips or nail polish. Do not wear lotions, powders, or perfumes. You may wear deodorant. Do not shave 48 hours prior to surgery. Men may shave face and neck. Do not bring valuables to the hospital.    [facility] is not responsible for any belongings or valuables.  Contacts, dentures or bridgework may not be worn into surgery. Leave your suitcase in the car. After surgery it may be brought to your room. For patients admitted to the hospital, discharge time is determined by your treatment team.  Patients discharged the day of surgery will not be allowed to drive home.   Make arrangements for someone to be with you for the first 24 hours of your Same Day Discharge.   ____ Take these medicines the morning of surgery with A SIP OF WATER:    1. None   __X__ Stop clopidogrel (PLAVIX) 75 MG as instructed by your doctor.  __X__ Stop Anti-inflammatories such as Ibuprofen, Aleve, Advil, naproxen, aspirin and or BC powder.   __X__ Stop supplements until after surgery.    __X__ Do not start any herbal supplements before your surgery.

## 2020-04-06 ENCOUNTER — Other Ambulatory Visit: Payer: Self-pay

## 2020-04-06 ENCOUNTER — Other Ambulatory Visit
Admission: RE | Admit: 2020-04-06 | Discharge: 2020-04-06 | Disposition: A | Discharge status: Home / Self Care | Payer: BC Managed Care – PPO | Source: Ambulatory Visit | Attending: Obstetrics and Gynecology | Admitting: Obstetrics and Gynecology

## 2020-04-06 ENCOUNTER — Ambulatory Visit: Payer: BC Managed Care – PPO

## 2020-04-06 DIAGNOSIS — Z20822 Contact with and (suspected) exposure to covid-19: Secondary | ICD-10-CM | POA: Insufficient documentation

## 2020-04-06 DIAGNOSIS — Z01812 Encounter for preprocedural laboratory examination: Secondary | ICD-10-CM | POA: Insufficient documentation

## 2020-04-06 LAB — CBC
HCT: 31.7 % — ABNORMAL LOW (ref 36.0–46.0)
Hemoglobin: 10.5 g/dL — ABNORMAL LOW (ref 12.0–15.0)
MCH: 29.7 pg (ref 26.0–34.0)
MCHC: 33.1 g/dL (ref 30.0–36.0)
MCV: 89.8 fL (ref 80.0–100.0)
Platelets: 264 10*3/uL (ref 150–400)
RBC: 3.53 MIL/uL — ABNORMAL LOW (ref 3.87–5.11)
RDW: 17.2 % — ABNORMAL HIGH (ref 11.5–15.5)
WBC: 5.7 10*3/uL (ref 4.0–10.5)
nRBC: 0 % (ref 0.0–0.2)

## 2020-04-06 LAB — COMPREHENSIVE METABOLIC PANEL
ALT: 11 U/L (ref 0–44)
AST: 12 U/L — ABNORMAL LOW (ref 15–41)
Albumin: 2.7 g/dL — ABNORMAL LOW (ref 3.5–5.0)
Alkaline Phosphatase: 47 U/L (ref 38–126)
Anion gap: 13 (ref 5–15)
BUN: 36 mg/dL — ABNORMAL HIGH (ref 8–23)
CO2: 29 mmol/L (ref 22–32)
Calcium: 7.6 mg/dL — ABNORMAL LOW (ref 8.9–10.3)
Chloride: 91 mmol/L — ABNORMAL LOW (ref 98–111)
Creatinine, Ser: 8.5 mg/dL — ABNORMAL HIGH (ref 0.44–1.00)
GFR calc Af Amer: 5 mL/min — ABNORMAL LOW (ref >60)
GFR calc non Af Amer: 5 mL/min — ABNORMAL LOW (ref >60)
Glucose, Bld: 113 mg/dL — ABNORMAL HIGH (ref 70–99)
Potassium: 2.9 mmol/L — ABNORMAL LOW (ref 3.5–5.1)
Sodium: 133 mmol/L — ABNORMAL LOW (ref 135–145)
Total Bilirubin: 0.6 mg/dL (ref 0.3–1.2)
Total Protein: 6.8 g/dL (ref 6.5–8.1)

## 2020-04-06 NOTE — Progress Notes (Signed)
[facility] Perioperative Services  Pre-Admission/Anesthesia Testing Clinical Review  Date: 04/06/20  Patient Demographics:  Name: Tracy Jennings DOB:   12-03-1955 MRN:   [id number]  Planned Surgical Procedure(s):    Case: [id number] Date/Time: 04/08/20 0715   Procedure: CONIZATION CERVIX WITH BIOPSY (N/A )   Anesthesia type: General   Pre-op diagnosis: Malignant neoplasm of the endocervix   Location: [facility] OR ROOM 05 / [place] ORS FOR ANESTHESIA GROUP   Surgeons: Mellody Drown, MD     NOTE: Available PAT nursing documentation and vital signs have been reviewed. Clinical nursing staff has updated patient's PMH/PSHx, current medication list, and drug allergies/intolerances to ensure comprehensive history available to assist in medical decision making as it pertains to the aforementioned surgical procedure and anticipated anesthetic course.   Clinical Discussion:  Tracy Jennings is a 64 y.o. female who is submitted for pre-surgical anesthesia review and clearance prior to her undergoing the above procedure. Patient has never been a smoker. 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   She reports previous intra-operative complications with anesthesia. Patient reports that she "received too much anesthesia" causing her to be "in a coma for days". Further review of her EMR reveals an episode of pulmonary arrest (05/11/2018) during an elective fistula placement. CPR was performed and mechanical ventilation was required. EKG and TTE were normal. Cardiology felt event to be respiratory in nature. Last anesthetic course was at [facility] ([facility] III) on 02/13/2019 with no documented complications.   Patient is followed by cardiology Fletcher Anon, MD). She was last seen in the cardiology  clinic on 03/30/2020; notes reviewed. Patient doing well from a cardiac perspective at that time. She denied angina, dyspnea, palpitations. Patient did endorse syncope back in 10/2019 for which she was admitted to the hospital (10/28/2019 - 11/01/2019). In review of admission notes, it was determined that syncope was related to orthostatic changes in the setting of SARS-CoV-2 (novel coronavirus) infection. TTE revealed an LVEF of 60-65%. Patient was discharged home on 11/01/2019 only to return on 11/03/2019 when she was re-admitted until 11/09/2019 for dehydration and inability to care for herself at home due to the negative sequela related to her SARS-CoV-2 infection.  Patient underwent stress echocardiogram in 12/2019 through [facility] cardiology, which revealed no evidence of ischemia. DASI document as > 4 METS. Per cardiology, "no further cardiac testing is needed at this time if needed proceed with noncardiac surgery at an overall LOW risk".  This patient is on daily anticoagulation therapy. She has been instructed on recommendations for holding her clopidogrel for 5 days prior to her procedure and 2 days post operatively. The patient has been instructed that her last dose of her anticoagulant will be on 04/03/2020.  Vitals with BMI 04/03/2020 04/01/2020 03/30/2020  Height '5\' 3"'  - '5\' 3"'   Weight 155 lbs 158 lbs 2 oz 159 lbs  BMI 27.46 91.47 82.95  Systolic - 621 308  Diastolic - 82 84  Pulse - 76 77    Providers/Specialists:   PROVIDER ROLE LAST Charolotte Capuchin, MD GYN Oncology (Surgeon) 04/01/2020  Sharyne Peach, MD Primary Care Provider 12/26/2019  Kathlyn [place], MD Cardiology 03/30/2020   Allergies:  Amlodipine, Hctz [hydrochlorothiazide], Heparin, Lmw heparin, Other, Sulfa antibiotics, Dairy aid [lactase], and Hydralazine  Current Home Medications:   No current facility-administered medications for this encounter.   Marland Kitchen  calcitRIOL (ROCALTROL) 0.25 MCG capsule  . calcium acetate  (PHOSLO) 667 MG capsule  . carvedilol (COREG) 25 MG tablet  . diltiazem (DILACOR XR) 120 MG 24 hr capsule  . clopidogrel (PLAVIX) 75 MG tablet  . prochlorperazine (COMPAZINE) 10 MG tablet   History:   Past Medical History:  Diagnosis Date  . Anemia 04/2018   low iron. to be started on supplements  . Cervical cancer ([place])   . CKD (chronic kidney disease)    Stage IV  . Complication of anesthesia    receceived too much anesthesia, that she was in coma for a couple days   . COVID-19 virus detected 10/28/2019  . Diabetes mellitus without complication ([place])    type II  . ESRD (end stage renal disease) ([place])   . Heart murmur    followed as a child only  . HSIL (high grade squamous intraepithelial lesion) on Pap smear of cervix   . Hyperlipidemia associated with type 2 diabetes mellitus ([place])   . Hypertension   . Pancreatitis   . Peripheral vascular disease [facility])    Past Surgical History:  Procedure Laterality Date  . AMPUTATION TOE Left 2013   2nd toe. tip of toe (toe nail was infected)  . AV FISTULA PLACEMENT Left 05/11/2018   Procedure: ARTERIOVENOUS (AV) FISTULA CREATION;  Surgeon: Katha Cabal, MD;  Location: [facility] ORS;  Service: Vascular;  Laterality: Left;  . CATARACT EXTRACTION    . CHOLECYSTECTOMY  2014  . COLONOSCOPY    . COLONOSCOPY WITH PROPOFOL N/A 01/10/2018   Procedure: COLONOSCOPY WITH PROPOFOL;  Surgeon: Toledo, Benay Pike, MD;  Location: [facility] ENDOSCOPY;  Service: Gastroenterology;  Laterality: N/A;  . DIALYSIS/PERMA CATHETER INSERTION N/A 05/21/2018   Procedure: DIALYSIS/PERMA CATHETER INSERTION;  Surgeon: Algernon Huxley, MD;  Location: [place] CV LAB;  Service: Cardiovascular;  Laterality: N/A;  . DIALYSIS/PERMA CATHETER REMOVAL N/A 04/11/2019   Procedure: DIALYSIS/PERMA CATHETER REMOVAL;  Surgeon: Algernon Huxley, MD;  Location: [place] CV LAB;  Service: Cardiovascular;  Laterality: N/A;  . EYE SURGERY Bilateral 2018   cataract extractions  .  THROMBECTOMY W/ EMBOLECTOMY  05/11/2018   Procedure: THROMBECTOMY ARTERIOVENOUS FISTULA;  Surgeon: Katha Cabal, MD;  Location: [facility] ORS;  Service: Vascular;;  . TUBAL LIGATION  1984   Family History  Problem Relation Age of Onset  . Stroke Mother   . Hypertension Mother   . Gout Mother   . Cancer Father   . Diabetes Sister   . Diabetes Maternal Grandmother   . Diabetes Maternal Grandfather   . Diabetes Paternal Grandmother   . Diabetes Paternal Grandfather    Social History   Tobacco Use  . Smoking status: Never Smoker  . Smokeless tobacco: Never Used  Vaping Use  . Vaping Use: Never used  Substance Use Topics  . Alcohol use: No  . Drug use: Never    Pertinent Clinical Results:  LABS: Labs reviewed: Acceptable for surgery. and Labs reviewed: Repeat    Patient is on home peritoneal dialysis. Creatinine values have improved (previously 10.57 mg/dL in 02/2020). PMH (+) for ESR with hyperkalemia. I have sent results to surgeon Fransisca Connors, MD) and his APP Zenia Resides, NP-C) for review. K+ will be repeated on the morning of surgery.   Hospital Outpatient Visit on 04/06/2020  Component Date Value Ref Range Status  . WBC 04/06/2020 5.7  4.0 - 10.5 K/uL Final  . RBC 04/06/2020 3.53* 3.87 - 5.11 MIL/uL Final  . Hemoglobin 04/06/2020 10.5*  12.0 - 15.0 g/dL Final  . HCT 04/06/2020 31.7* 36 - 46 % Final  . MCV 04/06/2020 89.8  80.0 - 100.0 fL Final  . MCH 04/06/2020 29.7  26.0 - 34.0 pg Final  . MCHC 04/06/2020 33.1  30.0 - 36.0 g/dL Final  . RDW 04/06/2020 17.2* 11.5 - 15.5 % Final  . Platelets 04/06/2020 264  150 - 400 K/uL Final  . nRBC 04/06/2020 0.0  0.0 - 0.2 % Final   Performed at [facility], [address] [place], [place] [address]  . Sodium 04/06/2020 133* 135 - 145 mmol/L Final  . Potassium 04/06/2020 2.9* 3.5 - 5.1 mmol/L Final  . Chloride 04/06/2020 91* 98 - 111 mmol/L Final  . CO2 04/06/2020 29  22 - 32 mmol/L Final  . Glucose, Bld 04/06/2020 113* 70 -  99 mg/dL Final   Glucose reference range applies only to samples taken after fasting for at least 8 hours.  . BUN 04/06/2020 36* 8 - 23 mg/dL Final  . Creatinine, Ser 04/06/2020 8.50* 0.44 - 1.00 mg/dL Final  . Calcium 04/06/2020 7.6* 8.9 - 10.3 mg/dL Final  . Total Protein 04/06/2020 6.8  6.5 - 8.1 g/dL Final  . Albumin 04/06/2020 2.7* 3.5 - 5.0 g/dL Final  . AST 04/06/2020 12* 15 - 41 U/L Final  . ALT 04/06/2020 11  0 - 44 U/L Final  . Alkaline Phosphatase 04/06/2020 47  38 - 126 U/L Final  . Total Bilirubin 04/06/2020 0.6  0.3 - 1.2 mg/dL Final  . GFR calc non Af Amer 04/06/2020 5* >60 mL/min Final  . GFR calc Af Amer 04/06/2020 5* >60 mL/min Final  . Anion gap 04/06/2020 13  5 - 15 Final   Performed at [facility], [address], [place], [place] [address]  . ABO/RH(D) 04/06/2020 PENDING   Incomplete  . Antibody Screen 04/06/2020 PENDING   Incomplete  . Sample Expiration 04/06/2020    Final                   Value:04/09/2020,2359 Performed at [facility], [address] [place], [place] [address]     ECG: Date: 07/19/201 Time ECG obtained: 1118 AM Rate: 77 bpm Rhythm: SR with occassional PVCs Axis (leads I and aVF): Normal Intervals: PR 140 ms. QTc 484 ms. ST segment and T wave changes: No evidence of ST segment elevation or depression Comparison: Similar to previous tracing obtained on 11/03/2019.   IMAGING / PROCEDURES: DOBUTAMINE STRESS ECHOCARDIOGRAM done on 01/08/2020 1. Pharmacologic stress echocardiogram is normal.  2. Normal left venticular systolic function with no regional wall motion abnormalities noted at rest.  3. Resting LVEF = 50-55%.  4. No regional wall motion abnormalities noted post stress. 5. Normal augmentation of all wall segments without evidence of ischemia with stress.  6. Peak stress LVEF = 65-70%.   EXERCISE STRESS TEST done on 12/02/2019 1. Inconclusive exercise treadmill test due to very poor exercise tolerance and  inability to walk on treadmill.  2. Consider alternative ischemic evaluation such as dobutamine stress echocardiogram or pharmacologic nuclear stress test.   3. Exercise duration: 1:23 on Bruce protocol.  4. METS: 4.6  5. Resting measurements: BP = 161/106, HR = 99 bpm  6. Peak measurements: BP = 161/106, HR = 115 bpm (72% PMHR)    US CAROTID DOPPLER done on 10/29/2019 1. Bilateral carotid bifurcation plaque resulting in less than 50% diameter ICA stenosis. 2. Antegrade bilateral vertebral arterial flow.  Impression and Plan:  Tracy Jennings has been referred for pre-anesthesia review and clearance prior her undergoing the planned anesthetic and procedural courses. Available labs, pertinent testing, and imaging results were personally reviewed by me. This patient has been appropriately cleared by cardiology.   Based on clinical review performed today (04/06/20), barring and significant acute changes in the patient's overall condition, it is anticipated that she will be able to proceed with the planned surgical intervention. Any acute changes in clinical condition may necessitate her procedure being postponed and/or cancelled. Pre-surgical instructions were reviewed with the patient during her PAT appointment and questions were fielded by PAT clinical staff.  Honor Loh, MSN, APRN, FNP-C, CEN [facility]  Peri-operative Services Nurse Practitioner Phone: [phone] 04/06/20 10:55 AM  NOTE: This note has been prepared using Dragon dictation software. Despite my best ability to proofread, there is always the potential that unintentional transcriptional errors may still occur from this process.

## 2020-04-06 NOTE — Progress Notes (Signed)
  [facility] Perioperative Services: Pre-Admission/Anesthesia Testing  Abnormal Lab Notification    Date: 04/06/20  Name: JULIE-ANNE TORAIN MRN:   [id number]  Re: Abnormal labs noted during PAT appointment   Provider(s) Notified: Mellody Drown, MD and Beckey Rutter, FNP-C Notification mode: Routed and/or faxed via [facility]   ABNORMAL LAB VALUE(S): Lab Results  Component Value Date   NA 133 (L) 04/06/2020   K 2.9 (L) 04/06/2020   CALCIUM 7.6 (L) 04/06/2020   BUN 36 (H) 04/06/2020   CREATININE 8.50 (H) 04/06/2020  Estimated Creatinine Clearance: 6.5 mL/min (A) (by C-G formula based on SCr of 8.5 mg/dL (H)).  Notes: Previous cardiology notes indicate ESRD with hyperkalemia. His is on peritoneal dialysis at home. Scheduled for CONE, D&C, and possible laparoscopy on 04/08/2020.  Honor Loh, MSN, APRN, FNP-C, CEN [facility]  Peri-operative Services Nurse Practitioner Phone: [phone] 04/06/20 10:37 AM

## 2020-04-07 ENCOUNTER — Telehealth: Payer: Self-pay | Admitting: Nurse Practitioner

## 2020-04-07 ENCOUNTER — Ambulatory Visit: Payer: BC Managed Care – PPO

## 2020-04-07 LAB — SARS CORONAVIRUS 2 (TAT 6-24 HRS): SARS Coronavirus 2: NEGATIVE

## 2020-04-07 NOTE — Telephone Encounter (Signed)
Discussed abnormal labs with patient. Likely secondary to dialysis. I reached out to Dr. Keturah Barre office and spoke to his nurse. She will notify Dr. Candiss Norse who can adjust dialysis and medications if he feels necessary.

## 2020-04-08 ENCOUNTER — Other Ambulatory Visit: Payer: Self-pay

## 2020-04-08 ENCOUNTER — Encounter
Admission: RE | Disposition: A | Discharge status: Home / Self Care | Payer: Self-pay | Source: Home / Self Care | Attending: Obstetrics and Gynecology

## 2020-04-08 ENCOUNTER — Ambulatory Visit
Admission: RE | Admit: 2020-04-08 | Discharge: 2020-04-08 | Disposition: A | Discharge status: Home / Self Care | Payer: BC Managed Care – PPO | Attending: Obstetrics and Gynecology | Admitting: Obstetrics and Gynecology

## 2020-04-08 ENCOUNTER — Ambulatory Visit: Payer: BC Managed Care – PPO

## 2020-04-08 ENCOUNTER — Ambulatory Visit: Payer: BC Managed Care – PPO | Admitting: Urgent Care

## 2020-04-08 DIAGNOSIS — I120 Hypertensive chronic kidney disease with stage 5 chronic kidney disease or end stage renal disease: Secondary | ICD-10-CM | POA: Insufficient documentation

## 2020-04-08 DIAGNOSIS — Z7902 Long term (current) use of antithrombotics/antiplatelets: Secondary | ICD-10-CM | POA: Diagnosis not present

## 2020-04-08 DIAGNOSIS — I6529 Occlusion and stenosis of unspecified carotid artery: Secondary | ICD-10-CM | POA: Diagnosis not present

## 2020-04-08 DIAGNOSIS — E1151 Type 2 diabetes mellitus with diabetic peripheral angiopathy without gangrene: Secondary | ICD-10-CM | POA: Diagnosis not present

## 2020-04-08 DIAGNOSIS — D631 Anemia in chronic kidney disease: Secondary | ICD-10-CM | POA: Diagnosis not present

## 2020-04-08 DIAGNOSIS — Z79899 Other long term (current) drug therapy: Secondary | ICD-10-CM | POA: Diagnosis not present

## 2020-04-08 DIAGNOSIS — E1122 Type 2 diabetes mellitus with diabetic chronic kidney disease: Secondary | ICD-10-CM | POA: Insufficient documentation

## 2020-04-08 DIAGNOSIS — C541 Malignant neoplasm of endometrium: Secondary | ICD-10-CM | POA: Insufficient documentation

## 2020-04-08 DIAGNOSIS — C530 Malignant neoplasm of endocervix: Secondary | ICD-10-CM | POA: Diagnosis present

## 2020-04-08 DIAGNOSIS — Z89422 Acquired absence of other left toe(s): Secondary | ICD-10-CM | POA: Insufficient documentation

## 2020-04-08 DIAGNOSIS — Z9221 Personal history of antineoplastic chemotherapy: Secondary | ICD-10-CM | POA: Diagnosis not present

## 2020-04-08 DIAGNOSIS — C538 Malignant neoplasm of overlapping sites of cervix uteri: Secondary | ICD-10-CM | POA: Insufficient documentation

## 2020-04-08 DIAGNOSIS — Z8616 Personal history of COVID-19: Secondary | ICD-10-CM | POA: Insufficient documentation

## 2020-04-08 DIAGNOSIS — Z992 Dependence on renal dialysis: Secondary | ICD-10-CM | POA: Insufficient documentation

## 2020-04-08 DIAGNOSIS — N186 End stage renal disease: Secondary | ICD-10-CM | POA: Diagnosis not present

## 2020-04-08 HISTORY — PX: CERVICAL CONIZATION W/BX: SHX1330

## 2020-04-08 LAB — GLUCOSE, CAPILLARY
Glucose-Capillary: 90 mg/dL (ref 70–99)
Glucose-Capillary: 89 mg/dL (ref 70–99)

## 2020-04-08 LAB — TYPE AND SCREEN
ABO/RH(D): A POS
Antibody Screen: NEGATIVE

## 2020-04-08 LAB — POTASSIUM: Potassium: 3.2 mmol/L — ABNORMAL LOW (ref 3.5–5.1)

## 2020-04-08 SURGERY — CONE BIOPSY, CERVIX
Anesthesia: General

## 2020-04-08 MED ORDER — ACETAMINOPHEN 500 MG PO TABS: 1000.0000 mg | ORAL_TABLET | ORAL | Status: AC

## 2020-04-08 MED ORDER — IODINE STRONG (LUGOLS) 5 % PO SOLN
ORAL | Status: DC | PRN
  Administered 2020-04-08: 0.2 mL

## 2020-04-08 MED ORDER — OXYCODONE HCL 5 MG PO TABS
5.0000 mg | ORAL_TABLET | Freq: Once | ORAL | Status: AC
  Administered 2020-04-08: 5 mg via ORAL

## 2020-04-08 MED ORDER — FENTANYL CITRATE (PF) 100 MCG/2ML IJ SOLN
INTRAMUSCULAR | Status: AC
  Filled 2020-04-08: qty 2

## 2020-04-08 MED ORDER — CHLORHEXIDINE GLUCONATE 0.12 % MT SOLN: 15.0000 mL | Freq: Once | OROMUCOSAL | Status: AC

## 2020-04-08 MED ORDER — OXYCODONE HCL 5 MG PO TABS
ORAL_TABLET | ORAL | Status: AC
  Filled 2020-04-08: qty 1

## 2020-04-08 MED ORDER — FAMOTIDINE 20 MG PO TABS
ORAL_TABLET | ORAL | Status: AC
  Administered 2020-04-08: 20 mg via ORAL
  Filled 2020-04-08: qty 1

## 2020-04-08 MED ORDER — SOD CITRATE-CITRIC ACID 500-334 MG/5ML PO SOLN
30.0000 mL | ORAL | Status: DC
  Filled 2020-04-08: qty 30

## 2020-04-08 MED ORDER — MIDAZOLAM HCL 2 MG/2ML IJ SOLN
INTRAMUSCULAR | Status: AC
  Filled 2020-04-08: qty 2

## 2020-04-08 MED ORDER — SILVER NITRATE-POT NITRATE 75-25 % EX MISC
CUTANEOUS | Status: AC
  Filled 2020-04-08: qty 10

## 2020-04-08 MED ORDER — GLYCOPYRROLATE 0.2 MG/ML IJ SOLN
INTRAMUSCULAR | Status: DC | PRN
  Administered 2020-04-08: .2 mg via INTRAVENOUS

## 2020-04-08 MED ORDER — PROPOFOL 10 MG/ML IV BOLUS
INTRAVENOUS | Status: DC | PRN
  Administered 2020-04-08: 100 mg via INTRAVENOUS
  Administered 2020-04-08 (×2): 50 mg via INTRAVENOUS
  Administered 2020-04-08: 100 mg via INTRAVENOUS

## 2020-04-08 MED ORDER — ESTROGENS, CONJUGATED 0.625 MG/GM VA CREA
TOPICAL_CREAM | VAGINAL | Status: AC
  Filled 2020-04-08: qty 30

## 2020-04-08 MED ORDER — IODINE STRONG (LUGOLS) 5 % PO SOLN
ORAL | Status: AC
  Filled 2020-04-08: qty 1

## 2020-04-08 MED ORDER — FENTANYL CITRATE (PF) 100 MCG/2ML IJ SOLN
INTRAMUSCULAR | Status: DC | PRN
  Administered 2020-04-08: 25 ug via INTRAVENOUS
  Administered 2020-04-08: 50 ug via INTRAVENOUS
  Administered 2020-04-08: 25 ug via INTRAVENOUS

## 2020-04-08 MED ORDER — LIDOCAINE HCL (PF) 2 % IJ SOLN
INTRAMUSCULAR | Status: AC
  Filled 2020-04-08: qty 5

## 2020-04-08 MED ORDER — PROPOFOL 10 MG/ML IV BOLUS
INTRAVENOUS | Status: AC
  Filled 2020-04-08: qty 20

## 2020-04-08 MED ORDER — GLYCOPYRROLATE 0.2 MG/ML IJ SOLN
INTRAMUSCULAR | Status: AC
  Filled 2020-04-08: qty 1

## 2020-04-08 MED ORDER — FERRIC SUBSULFATE 259 MG/GM EX SOLN
CUTANEOUS | Status: AC
  Filled 2020-04-08: qty 8

## 2020-04-08 MED ORDER — ACETAMINOPHEN 500 MG PO TABS
ORAL_TABLET | ORAL | Status: AC
  Administered 2020-04-08: 1000 mg via ORAL
  Filled 2020-04-08: qty 2

## 2020-04-08 MED ORDER — LIDOCAINE-EPINEPHRINE 1 %-1:100000 IJ SOLN
INTRAMUSCULAR | Status: AC
  Filled 2020-04-08: qty 1

## 2020-04-08 MED ORDER — CEFAZOLIN SODIUM-DEXTROSE 2-4 GM/100ML-% IV SOLN
2.0000 g | INTRAVENOUS | Status: AC
  Administered 2020-04-08: 2 g via INTRAVENOUS

## 2020-04-08 MED ORDER — CEFAZOLIN SODIUM-DEXTROSE 2-4 GM/100ML-% IV SOLN
INTRAVENOUS | Status: AC
  Filled 2020-04-08: qty 100

## 2020-04-08 MED ORDER — ONDANSETRON HCL 4 MG/2ML IJ SOLN
INTRAMUSCULAR | Status: AC
  Filled 2020-04-08: qty 2

## 2020-04-08 MED ORDER — FERRIC SUBSULFATE SOLN
Status: DC | PRN
  Administered 2020-04-08: 1

## 2020-04-08 MED ORDER — FENTANYL CITRATE (PF) 100 MCG/2ML IJ SOLN
25.0000 ug | INTRAMUSCULAR | Status: DC | PRN
  Administered 2020-04-08 (×5): 25 ug via INTRAVENOUS

## 2020-04-08 MED ORDER — METOCLOPRAMIDE HCL 5 MG/ML IJ SOLN
INTRAMUSCULAR | Status: AC
  Filled 2020-04-08: qty 2

## 2020-04-08 MED ORDER — ORAL CARE MOUTH RINSE: 15.0000 mL | Freq: Once | OROMUCOSAL | Status: AC

## 2020-04-08 MED ORDER — CHLORHEXIDINE GLUCONATE 0.12 % MT SOLN
OROMUCOSAL | Status: AC
  Administered 2020-04-08: 15 mL via OROMUCOSAL
  Filled 2020-04-08: qty 15

## 2020-04-08 MED ORDER — METOCLOPRAMIDE HCL 5 MG/ML IJ SOLN
10.0000 mg | Freq: Once | INTRAMUSCULAR | Status: AC
  Administered 2020-04-08: 10 mg via INTRAVENOUS

## 2020-04-08 MED ORDER — ONDANSETRON HCL 4 MG/2ML IJ SOLN
INTRAMUSCULAR | Status: DC | PRN
  Administered 2020-04-08: 4 mg via INTRAVENOUS

## 2020-04-08 MED ORDER — PHENYLEPHRINE HCL (PRESSORS) 10 MG/ML IV SOLN
INTRAVENOUS | Status: DC | PRN
  Administered 2020-04-08 (×3): 100 ug via INTRAVENOUS
  Administered 2020-04-08: 50 ug via INTRAVENOUS

## 2020-04-08 MED ORDER — SODIUM CHLORIDE 0.9 % IV SOLN: INTRAVENOUS | Status: DC

## 2020-04-08 MED ORDER — LIDOCAINE HCL (CARDIAC) PF 100 MG/5ML IV SOSY
PREFILLED_SYRINGE | INTRAVENOUS | Status: DC | PRN
  Administered 2020-04-08: 40 mg via INTRAVENOUS

## 2020-04-08 MED ORDER — FAMOTIDINE 20 MG PO TABS: 20.0000 mg | ORAL_TABLET | Freq: Once | ORAL | Status: AC

## 2020-04-08 MED ORDER — LIDOCAINE HCL (PF) 1 % IJ SOLN
INTRAMUSCULAR | Status: AC
  Filled 2020-04-08: qty 30

## 2020-04-08 MED ORDER — ONDANSETRON HCL 4 MG/2ML IJ SOLN
4.0000 mg | Freq: Once | INTRAMUSCULAR | Status: AC | PRN
  Administered 2020-04-08: 4 mg via INTRAVENOUS

## 2020-04-08 MED ORDER — GELATIN ABSORBABLE 100 CM EX MISC
CUTANEOUS | Status: AC
  Filled 2020-04-08: qty 1

## 2020-04-08 SURGICAL SUPPLY — 38 items
APL SWBSTK 6 STRL LF DISP (MISCELLANEOUS) ×6
APPLICATOR COTTON TIP 6 STRL (MISCELLANEOUS) ×6 IMPLANT
APPLICATOR COTTON TIP 6IN STRL (MISCELLANEOUS) ×12
BLADE SURG SZ10 CARB STEEL (BLADE) ×2 IMPLANT
BLADE SURG SZ11 CARB STEEL (BLADE) ×2 IMPLANT
CANISTER SUCT 1200ML W/VALVE (MISCELLANEOUS) ×2 IMPLANT
CATH ROBINSON RED A/P 16FR (CATHETERS) ×2 IMPLANT
COVER WAND RF STERILE (DRAPES) IMPLANT
DRAPE PERI LITHO V/GYN (MISCELLANEOUS) ×2 IMPLANT
DRAPE UNDER BUTTOCK W/FLU (DRAPES) ×2 IMPLANT
DRSG TELFA 3X8 NADH (GAUZE/BANDAGES/DRESSINGS) ×2 IMPLANT
DRSG TELFA 4X3 1S NADH ST (GAUZE/BANDAGES/DRESSINGS) ×2 IMPLANT
ELECT BLADE 6 FLAT ULTRCLN (ELECTRODE) ×2 IMPLANT
ELECT REM PT RETURN 9FT ADLT (ELECTROSURGICAL) ×2
ELECTRODE REM PT RTRN 9FT ADLT (ELECTROSURGICAL) ×1 IMPLANT
GAUZE 4X4 16PLY RFD (DISPOSABLE) ×2 IMPLANT
GAUZE PACK 2X3YD (GAUZE/BANDAGES/DRESSINGS) ×2 IMPLANT
GLOVE BIO SURGEON STRL SZ7 (GLOVE) ×2 IMPLANT
GLOVE INDICATOR 7.5 STRL GRN (GLOVE) ×2 IMPLANT
GOWN STRL REUS W/ TWL LRG LVL3 (GOWN DISPOSABLE) ×2 IMPLANT
GOWN STRL REUS W/TWL LRG LVL3 (GOWN DISPOSABLE) ×4
HEMOSTAT SURGICEL 2X14 (HEMOSTASIS) ×4 IMPLANT
JELLY LUB 2OZ STRL (MISCELLANEOUS)
JELLY LUBE 2OZ STRL (MISCELLANEOUS) IMPLANT
LABEL OR SOLS (LABEL) ×2 IMPLANT
NEEDLE HYPO 25GX1 SAFETY (NEEDLE) IMPLANT
NS IRRIG 500ML POUR BTL (IV SOLUTION) ×2 IMPLANT
PACK BASIN MINOR (MISCELLANEOUS) ×2 IMPLANT
PAD OB MATERNITY 4.3X12.25 (PERSONAL CARE ITEMS) ×2 IMPLANT
PAD PREP 24X41 OB/GYN DISP (PERSONAL CARE ITEMS) ×2 IMPLANT
STRAP SAFETY 5IN WIDE (MISCELLANEOUS) ×2 IMPLANT
SUT VIC AB 0 CT1 27 (SUTURE) ×2
SUT VIC AB 0 CT1 27XCR 8 STRN (SUTURE) ×1 IMPLANT
SUT VIC AB 2-0 SH 27 (SUTURE) ×2
SUT VIC AB 2-0 SH 27XBRD (SUTURE) ×1 IMPLANT
SUT VICRYL 2-0 SH 8X27 (SUTURE) ×2 IMPLANT
SYR 10ML LL (SYRINGE) IMPLANT
SYR CONTROL 10ML LL (SYRINGE) ×2 IMPLANT

## 2020-04-08 NOTE — Anesthesia Postprocedure Evaluation (Signed)
Anesthesia Post Note  Patient: Tracy Jennings  Procedure(s) Performed: CONIZATION CERVIX WITH BIOPSY (N/A )  Patient location during evaluation: PACU Anesthesia Type: General Level of consciousness: awake and alert Pain management: pain level controlled Vital Signs Assessment: post-procedure vital signs reviewed and stable Respiratory status: spontaneous breathing and respiratory function stable Cardiovascular status: stable Anesthetic complications: no   No complications documented.   Last Vitals:  Vitals:   04/08/20 0908 04/08/20 0915  BP:  (!) 170/108  Pulse: 70 70  Resp: 14 18  Temp:    SpO2: 95% 99%    Last Pain:  Vitals:   04/08/20 0915  TempSrc:   PainSc: 7                  France Noyce K

## 2020-04-08 NOTE — Transfer of Care (Signed)
Immediate Anesthesia Transfer of Care Note  Patient: Tracy Jennings  Procedure(s) Performed: CONIZATION CERVIX WITH BIOPSY (N/A )  Patient Location: PACU  Anesthesia Type:General  Level of Consciousness: drowsy  Airway & Oxygen Therapy: Patient Spontanous Breathing and Patient connected to nasal cannula oxygen  Post-op Assessment: Report given to RN  Post vital signs: stable  Last Vitals:  Vitals Value Taken Time  BP 163/76 04/08/20 0843  Temp    Pulse 66 04/08/20 0845  Resp 14 04/08/20 0845  SpO2 100 % 04/08/20 0845  Vitals shown include unvalidated device data.  Last Pain:  Vitals:   04/08/20 0619  TempSrc: Temporal  PainSc: 0-No pain         Complications: No complications documented.

## 2020-04-08 NOTE — Anesthesia Procedure Notes (Signed)
Procedure Name: LMA Insertion Date/Time: 04/08/2020 7:39 AM Performed by: Lerry Liner, CRNA Pre-anesthesia Checklist: Emergency Drugs available, Patient identified, Suction available, Patient being monitored and Timeout performed Patient Re-evaluated:Patient Re-evaluated prior to induction Oxygen Delivery Method: Circle system utilized Preoxygenation: Pre-oxygenation with 100% oxygen Induction Type: IV induction Ventilation: Mask ventilation without difficulty LMA: LMA inserted LMA Size: 4.0 Number of attempts: 1 Tube secured with: Tape Dental Injury: Teeth and Oropharynx as per pre-operative assessment

## 2020-04-08 NOTE — Op Note (Signed)
  Operative Note  04/08/2020 8:54 AM  PRE-OP DIAGNOSIS: Malignant adenocarcinoma of the endocervix    POST-OP DIAGNOSIS: same  SURGEON: Surgeon(s) and Role:    Mellody Drown, MD - Primary  ANESTHESIA: General LMA  PROCEDURE: CONIZATION CERVIX WITH BIOPSY   ESTIMATED BLOOD LOSS: 20 ml  Findings:  Cervix large, but smooth, no lesions or nodularity noted. Uterus small and normal bimanual and rectovaginal exam.  Procedure: Patient was placed in lithotomy position in [facility] stirrups after induction of anesthesia and prepped and draped in the usual fashion.  Straight cath was done, but no urine in view of ESRD.  Weighted speculum placed in the vagina and deaver anteriorly.  Luglos place on cervix and no nonstaining areas.  Stay sutures placed at 3 and 9 for hemostasis with 0 Vicryl.  The large, broad, deep cone was incised with the bovie cutting current and scissors and tagged with a suture at 12 o'clock.  An ECC and endometrial biopsy were done and minimal tissue obtained.  A row of interrupted 0 Vicryl mattress sutures was placed posteriorly for hemostasis, which was excellent. The bovie and monsels was also used and then Surgicel was placed in the bed and tied in place.  There was no bleeding at all by the end of the case.     She tolerated the procedure well and was awakened and taken to the recovery room in stable condition.  Sponge, needle and instrument counts were correct.  Antibiotics: 2 gm Ancef  VTE prophylaxis: not indicated.  She stopped Plavix 5 days ago and will resume in 3 days per cardiology.  Mellody Drown, MD

## 2020-04-08 NOTE — Anesthesia Preprocedure Evaluation (Signed)
Anesthesia Evaluation  Patient identified by MRN, date of birth, ID band Patient awake    Reviewed: Allergy & Precautions, NPO status , Patient's Chart, lab work & pertinent test results  History of Anesthesia Complications (+) history of anesthetic complications ("in a coma fior three days post-op")  Airway Mallampati: II       Dental   Pulmonary neg sleep apnea, neg COPD, Not current smoker,           Cardiovascular hypertension, Pt. on medications (-) Past MI and (-) CHF (-) dysrhythmias + Valvular Problems/Murmurs (murmur since childhood)      Neuro/Psych neg Seizures    GI/Hepatic Neg liver ROS, neg GERD  ,  Endo/Other  diabetes, Type 2, Oral Hypoglycemic Agents  Renal/GU ESRF and DialysisRenal disease     Musculoskeletal   Abdominal   Peds  Hematology  (+) anemia ,   Anesthesia Other Findings   Reproductive/Obstetrics                             Anesthesia Physical Anesthesia Plan  ASA: IV  Anesthesia Plan: General   Post-op Pain Management:    Induction: Intravenous  PONV Risk Score and Plan: 3 and Ondansetron, Dexamethasone and Treatment may vary due to age or medical condition  Airway Management Planned: LMA  Additional Equipment:   Intra-op Plan:   Post-operative Plan:   Informed Consent: I have reviewed the patients History and Physical, chart, labs and discussed the procedure including the risks, benefits and alternatives for the proposed anesthesia with the patient or authorized representative who has indicated his/her understanding and acceptance.       Plan Discussed with:   Anesthesia Plan Comments:         Anesthesia Quick Evaluation

## 2020-04-08 NOTE — OR Nursing (Signed)
Per Dr. Fransisca Connors, verbal, patient may resume plavix on Saturday, April 11, 2020.  Added to discharge instructions/med section.

## 2020-04-08 NOTE — Interval H&P Note (Signed)
History and Physical Interval Note:  04/08/2020 7:21 AM  Tracy Jennings  has presented today for surgery, with the diagnosis of Malignant neoplasm of the endocervix.  The various methods of treatment have been discussed with the patient and family. After consideration of risks, benefits and other options for treatment, the patient has consented to  Procedure(s): CONIZATION CERVIX WITH BIOPSY (N/A) and D&C as a surgical intervention.  The patient's history has been reviewed, patient examined.  I have reviewed the patient's chart and labs.  Questions were answered to the patient's satisfaction.    She was seen by cardiology on 03/30/2020 and felt to be doing well from cardiac perspective and given clearance for surgery. Echo in 10/2019 revealed EF 60-65%. EKG 03/30/20 demonstrated NSR, 77 bpm, rare PVC, lVH, poor R wave progression along precordial leads, no acute st/t changes. Blood pressure is therapeutically elevated due to history of orthostatic hypotension. She is felt to be low risk for noncardiac surgery. Vascular surgery recommended holding clopidogrel for 5 days prior to surgery and for 2 days post operatively.    Patient confirms that she stopped Plavix 5 days ago, no bleeding.  Continues with nightly peritoneal dialysis.  She reports being in a coma for three days previously after general anesthesia.    I discussed surgery with Dr Ronelle Nigh from Anesthesia and his plan was for propofol anesthesia with LMA since paralysis not needed and that this should wear off quickly.  Pre op labs 2 days ago showed potassium of 2.9.  She had taken potassium supplements in the past, but had hyperkalemia and stopped.  Potassium being checked this morning and must be at least 3.0 to proceed per Dr Ronelle Nigh.  Mellody Drown

## 2020-04-08 NOTE — Discharge Instructions (Addendum)
Patient instructed to call for heavy bleeding, pain, fever or other concerning symptoms.  Tylenol and NSAIDs prn for pain, cramping.   Will contact her with path report when available next week to determine need for further treatment of cervical cancer.  Mellody Drown, MD    AMBULATORY SURGERY  DISCHARGE INSTRUCTIONS   1) The drugs that you were given will stay in your system until tomorrow so for the next 24 hours you should not:  A) Drive an automobile B) Make any legal decisions C) Drink any alcoholic beverage   2) You may resume regular meals tomorrow.  Today it is better to start with liquids and gradually work up to solid foods.  You may eat anything you prefer, but it is better to start with liquids, then soup and crackers, and gradually work up to solid foods.   3) Please notify your doctor immediately if you have any unusual bleeding, trouble breathing, redness and pain at the surgery site, drainage, fever, or pain not relieved by medication.    4) Additional Instructions:        Please contact your physician with any problems or Same Day Surgery at [phone], Monday through Friday 6 am to 4 pm, or [place] [facility] number at [phone].

## 2020-04-09 ENCOUNTER — Encounter: Payer: Self-pay | Admitting: Obstetrics and Gynecology

## 2020-04-09 ENCOUNTER — Ambulatory Visit: Payer: BC Managed Care – PPO

## 2020-04-10 ENCOUNTER — Ambulatory Visit: Payer: BC Managed Care – PPO

## 2020-04-13 ENCOUNTER — Ambulatory Visit: Payer: BC Managed Care – PPO

## 2020-04-14 ENCOUNTER — Ambulatory Visit: Payer: BC Managed Care – PPO

## 2020-04-15 ENCOUNTER — Ambulatory Visit: Payer: BC Managed Care – PPO

## 2020-04-15 ENCOUNTER — Telehealth: Payer: Self-pay | Admitting: Nurse Practitioner

## 2020-04-15 NOTE — Telephone Encounter (Signed)
Dr. Fransisca Connors reviewed preliminary pathology from cone biopsy and feels patient will benefit from radiation possibly concurrent chemotherapy and radiation.  Awaiting final pathology.  Scheduled appointment for her with Dr. Baruch Gouty on 04/24/2020 at 9 AM.  Discussed with patient who is agreeable.

## 2020-04-16 ENCOUNTER — Ambulatory Visit: Payer: BC Managed Care – PPO

## 2020-04-17 ENCOUNTER — Ambulatory Visit: Payer: BC Managed Care – PPO

## 2020-04-20 ENCOUNTER — Ambulatory Visit: Payer: BC Managed Care – PPO

## 2020-04-21 ENCOUNTER — Ambulatory Visit: Payer: BC Managed Care – PPO

## 2020-04-22 ENCOUNTER — Ambulatory Visit: Payer: BC Managed Care – PPO

## 2020-04-23 ENCOUNTER — Ambulatory Visit: Payer: BC Managed Care – PPO

## 2020-04-24 ENCOUNTER — Telehealth: Payer: Self-pay

## 2020-04-24 ENCOUNTER — Other Ambulatory Visit: Payer: Self-pay

## 2020-04-24 ENCOUNTER — Encounter: Payer: Self-pay | Admitting: Radiation Oncology

## 2020-04-24 ENCOUNTER — Ambulatory Visit
Admission: RE | Admit: 2020-04-24 | Discharge: 2020-04-24 | Disposition: A | Discharge status: Home / Self Care | Payer: BC Managed Care – PPO | Source: Ambulatory Visit | Attending: Radiation Oncology | Admitting: Radiation Oncology

## 2020-04-24 ENCOUNTER — Ambulatory Visit: Payer: BC Managed Care – PPO

## 2020-04-24 VITALS — Wt 137.0 lb

## 2020-04-24 DIAGNOSIS — C530 Malignant neoplasm of endocervix: Secondary | ICD-10-CM | POA: Diagnosis present

## 2020-04-24 DIAGNOSIS — N186 End stage renal disease: Secondary | ICD-10-CM | POA: Diagnosis not present

## 2020-04-24 DIAGNOSIS — Z992 Dependence on renal dialysis: Secondary | ICD-10-CM | POA: Insufficient documentation

## 2020-04-24 NOTE — Telephone Encounter (Signed)
Pathology consult on CONE biopsy pending from [place]. Will continue to monitor for final pathology.

## 2020-04-24 NOTE — Progress Notes (Signed)
Radiation Oncology Follow up Note  Name: Tracy Jennings   Date:   04/24/2020 MRN:  [id number] DOB: 06-25-1956    This 64 y.o. female presents to the clinic today for reevaluation after cone biopsy of her cervix.  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  She has end-stage renal disease.  At that time we recommended whole pelvic radiation therapy and possible brachytherapy.  Dr. Fransisca Jennings took her to surgery for.  Conization of the cervix.  No lesions were noted grossly.  Cervix showed invasive adenocarcinoma with involvement of endocervical and deep margins.  Endocervical Tracy [facility] also suspicious for malignancy and again one area showed invasive adenocarcinoma.  She is doing well at the present time still have some occasional spotting.  Patient is on peritoneal dialysis.  She is seen today to be reevaluated for radiation therapy.  COMPLICATIONS OF TREATMENT: none  FOLLOW UP COMPLIANCE: keeps appointments   PHYSICAL EXAM:  Wt 137 lb (62.1 kg)   BMI 24.27 kg/m  On speculum examination there is still some granulation tissue in the cervix present.  Well-developed well-nourished patient in NAD. HEENT reveals PERLA, EOMI, discs not visualized.  Oral cavity is clear. No oral mucosal lesions are identified. Neck is clear without evidence of cervical or supraclavicular adenopathy. Lungs are clear to A&P. Cardiac examination is essentially unremarkable with regular rate and rhythm without murmur rub or thrill. Abdomen is benign with no organomegaly or masses noted. Motor sensory and DTR levels are equal and symmetric in the upper and lower extremities. Cranial nerves II through XII are grossly intact. Proprioception is intact. No peripheral adenopathy or edema is identified. No motor or sensory levels are noted. Crude visual fields are within normal range.  RADIOLOGY RESULTS: PET/CT reviewed again showing  low-level activity in the area of the cervix.  PLAN: Present time like to go ahead with whole pelvic radiation therapy 4500 centigrade over 5 weeks also would repeat make a referral to Dr. Christel Jennings at [facility] for possible intracavitary l brachytherapy.  Risks and benefits of treatment including possible increased lower urinary tract symptoms diarrhea fatigue alteration of blood counts and skin reaction all again were reviewed I would like to resimulate the patient again and to set that up for about a week's time.  Patient also will see Dr. Grayland Jennings again for possible systemic chemotherapy.  Patient comprehends her recommendations well.  I would like to take this opportunity to thank you for allowing me to participate in the care of your patient.Tracy Filbert, MD

## 2020-04-27 ENCOUNTER — Ambulatory Visit: Payer: BC Managed Care – PPO

## 2020-04-28 ENCOUNTER — Ambulatory Visit: Payer: BC Managed Care – PPO

## 2020-04-29 ENCOUNTER — Ambulatory Visit: Payer: BC Managed Care – PPO

## 2020-04-30 ENCOUNTER — Ambulatory Visit: Payer: BC Managed Care – PPO

## 2020-05-01 ENCOUNTER — Ambulatory Visit: Payer: BC Managed Care – PPO

## 2020-05-04 ENCOUNTER — Ambulatory Visit: Payer: BC Managed Care – PPO

## 2020-05-05 ENCOUNTER — Telehealth: Payer: Self-pay

## 2020-05-05 ENCOUNTER — Ambulatory Visit
Admission: RE | Admit: 2020-05-05 | Discharge: 2020-05-05 | Disposition: A | Discharge status: Home / Self Care | Payer: BC Managed Care – PPO | Source: Ambulatory Visit | Attending: Radiation Oncology | Admitting: Radiation Oncology

## 2020-05-05 DIAGNOSIS — C539 Malignant neoplasm of cervix uteri, unspecified: Secondary | ICD-10-CM | POA: Diagnosis not present

## 2020-05-05 NOTE — Telephone Encounter (Signed)
Dr. Fransisca Connors has reviewed pathology consult from [facility]. Plan for pelvic radiation and then he would like to repeat PET/CT scan to decide on whether to do hysterectomy or finish with brachytherapy. Involved team members notified.

## 2020-05-05 NOTE — Telephone Encounter (Signed)
Notified Dr. Fransisca Connors that [place] has signed out pathology from 04/08/20 cervix conization.

## 2020-05-06 LAB — SURGICAL PATHOLOGY

## 2020-05-07 DIAGNOSIS — C539 Malignant neoplasm of cervix uteri, unspecified: Secondary | ICD-10-CM | POA: Diagnosis not present

## 2020-05-11 ENCOUNTER — Other Ambulatory Visit: Payer: Self-pay | Admitting: *Deleted

## 2020-05-11 DIAGNOSIS — C530 Malignant neoplasm of endocervix: Secondary | ICD-10-CM

## 2020-05-12 ENCOUNTER — Ambulatory Visit: Admission: RE | Admit: 2020-05-12 | Payer: BC Managed Care – PPO | Source: Ambulatory Visit

## 2020-05-12 DIAGNOSIS — C539 Malignant neoplasm of cervix uteri, unspecified: Secondary | ICD-10-CM | POA: Diagnosis not present

## 2020-05-13 ENCOUNTER — Ambulatory Visit
Admission: RE | Admit: 2020-05-13 | Discharge: 2020-05-13 | Disposition: A | Discharge status: Home / Self Care | Payer: BC Managed Care – PPO | Source: Ambulatory Visit | Attending: Radiation Oncology | Admitting: Radiation Oncology

## 2020-05-13 ENCOUNTER — Other Ambulatory Visit: Payer: Self-pay | Admitting: Oncology

## 2020-05-13 ENCOUNTER — Ambulatory Visit: Payer: BC Managed Care – PPO | Admitting: Oncology

## 2020-05-13 ENCOUNTER — Inpatient Hospital Stay: Payer: BC Managed Care – PPO | Attending: Radiation Oncology | Admitting: Obstetrics and Gynecology

## 2020-05-13 ENCOUNTER — Other Ambulatory Visit: Payer: BC Managed Care – PPO

## 2020-05-13 ENCOUNTER — Ambulatory Visit: Payer: BC Managed Care – PPO

## 2020-05-13 ENCOUNTER — Other Ambulatory Visit: Payer: Self-pay

## 2020-05-13 VITALS — BP 163/87 | HR 101 | Temp 98.5°F | Resp 20 | Wt 133.1 lb

## 2020-05-13 DIAGNOSIS — E1136 Type 2 diabetes mellitus with diabetic cataract: Secondary | ICD-10-CM | POA: Insufficient documentation

## 2020-05-13 DIAGNOSIS — R5383 Other fatigue: Secondary | ICD-10-CM | POA: Diagnosis not present

## 2020-05-13 DIAGNOSIS — R011 Cardiac murmur, unspecified: Secondary | ICD-10-CM | POA: Diagnosis not present

## 2020-05-13 DIAGNOSIS — N186 End stage renal disease: Secondary | ICD-10-CM | POA: Insufficient documentation

## 2020-05-13 DIAGNOSIS — E876 Hypokalemia: Secondary | ICD-10-CM | POA: Insufficient documentation

## 2020-05-13 DIAGNOSIS — R600 Localized edema: Secondary | ICD-10-CM | POA: Insufficient documentation

## 2020-05-13 DIAGNOSIS — C539 Malignant neoplasm of cervix uteri, unspecified: Secondary | ICD-10-CM

## 2020-05-13 DIAGNOSIS — C530 Malignant neoplasm of endocervix: Secondary | ICD-10-CM

## 2020-05-13 DIAGNOSIS — E785 Hyperlipidemia, unspecified: Secondary | ICD-10-CM | POA: Insufficient documentation

## 2020-05-13 DIAGNOSIS — D649 Anemia, unspecified: Secondary | ICD-10-CM | POA: Insufficient documentation

## 2020-05-13 DIAGNOSIS — Z7901 Long term (current) use of anticoagulants: Secondary | ICD-10-CM | POA: Insufficient documentation

## 2020-05-13 DIAGNOSIS — I120 Hypertensive chronic kidney disease with stage 5 chronic kidney disease or end stage renal disease: Secondary | ICD-10-CM | POA: Diagnosis not present

## 2020-05-13 DIAGNOSIS — D631 Anemia in chronic kidney disease: Secondary | ICD-10-CM | POA: Diagnosis not present

## 2020-05-13 DIAGNOSIS — I951 Orthostatic hypotension: Secondary | ICD-10-CM | POA: Diagnosis not present

## 2020-05-13 DIAGNOSIS — Z992 Dependence on renal dialysis: Secondary | ICD-10-CM | POA: Insufficient documentation

## 2020-05-13 DIAGNOSIS — Z79899 Other long term (current) drug therapy: Secondary | ICD-10-CM | POA: Insufficient documentation

## 2020-05-13 DIAGNOSIS — Z5111 Encounter for antineoplastic chemotherapy: Secondary | ICD-10-CM | POA: Insufficient documentation

## 2020-05-13 DIAGNOSIS — R197 Diarrhea, unspecified: Secondary | ICD-10-CM | POA: Diagnosis not present

## 2020-05-13 DIAGNOSIS — Z8616 Personal history of COVID-19: Secondary | ICD-10-CM | POA: Insufficient documentation

## 2020-05-13 DIAGNOSIS — I6529 Occlusion and stenosis of unspecified carotid artery: Secondary | ICD-10-CM | POA: Insufficient documentation

## 2020-05-13 DIAGNOSIS — E1122 Type 2 diabetes mellitus with diabetic chronic kidney disease: Secondary | ICD-10-CM | POA: Insufficient documentation

## 2020-05-13 NOTE — Progress Notes (Signed)
Gynecologic Oncology History and Physical [facility]  Telephone:([phone] [phone]  Patient Care Team: Sharyne Peach, MD as PCP - General (Family Medicine) Wellington Hampshire, MD as PCP - Cardiology (Cardiology) Noreene Filbert, MD as Radiation Oncologist (Radiation Oncology) Clent Jacks, RN as Oncology Nurse Navigator   Name of the patient: Tracy Jennings  [id number]  June 01, 1956   Date of visit: 04/01/2020  Gynecologic Oncology Consult Visit   Referring Provider: Dr Leonides Schanz  Chief Concern: Cervical adenocarcinoma  Subjective:  Tracy Jennings is a 64 y.o. female with ESRD on peritoneal dialysis who is seen in consultation from Dr Leonides Schanz for adenocarcinoma of the cervix.   She returns to clinic today for post op visit after cone biopsy. Started pelvic radiation yesterday with Dr Baruch Gouty at [facility].  Has some vaginal discharge.    Gynecology Oncology History PAP: Atypical squamous cells cannot exclude HSIL (ASC-H)  Seen by Dr Larey Days 03/10/20 On exam: Vagina normal, well-estrogenized vaginal mucosa, no evidence of vaginal neoplasia, Cervix: No overt carcinoma, well-visualized  Colposcopic exam: TZ not seen entirely, AWE not seen. No lesions seen.  ECC was done, random cervical biopsies were taken Pathology 10:00 - suspicious for adenocarcinoma, 6:00 bx and ECC - adenocarcinoma  She was initially seen on 03/11/2020 in the [facility] and cervix looked and felt normal.  PET/CT to evaluate origin of the cancer (uterine vs. cervical origin) and assess for extent of disease.  PET scan 03/13/20 shows some low level FDG activity in the cervix (none in uterus), but no prominent cervical mass.  Some PET activity in pelvic nodes, but not above blood pool and no other primary or metastatic disease noted.  Initial plan was to proceed with chemoradiation for cervical cancer.  However, without any knowledge of the size or depth of invasion of  the cancer in the cervix based on exam and imaging it was decided that a cone biopsy and D&C was needed to confirm that there is more than 3 mm of invasive cancer in the cervix and that there was no endometrial primary.  If cancer was confined to the cervix and invasion and < 82m invasion, this could be sufficiently treated with a large cone biopsy with negative margins, and it would be better to avoid the potential toxicity of chemoradiation in view of her existing vascular disease and dialysis.  If she is proven to have a cervical cancer with > 3 mm of invasion or LVSI would proceed with chemoradiation.   History significant for significant vascular disease with end stage renal disease, on peritoneal dialysis and carotid stenosis. [facility] for thrombosis prophylaxis. She had a thrombosis of her AVF fistula in 2019 after this was placed and had cardiac arrest post thrombectomy.  She was seen by cardiology on 03/30/2020 and felt to be doing well from cardiac perspective and given clearance for surgery. Echo in 10/2019 revealed EF 60-65%. EKG 03/30/20 demonstrated NSR, 77 bpm, rare PVC, lVH, poor R wave progression along precordial leads, no acute st/t changes. Blood pressure is therapeutically elevated due to history of orthostatic hypotension. She is felt to be low risk for noncardiac surgery. Vascular surgery recommended holding clopidogrel for 5 days prior to surgery and for 2 days post operatively. She continues peritoneal dialysis.   Cone biopsy 04/08/20  A. CERVIX, CONIZATION BIOPSY:  - INVASIVE ADENOCARCINOMA WITH INVOLVEMENT OF ENDOCERVICAL AND DEEP MARGINS.   B. ENDOCERVIX,  CURETTAGE:  - SCANT DETACHED AND DEGENERATED CELLS, SUSPICIOUS FOR MALIGNANCY.   C. ENDOMETRIUM, CURETTAGE: - INVASIVE ADENOCARCINOMA.   Comment:  Sections demonstrate an infiltrative adenocarcinoma with areas of marked nuclear pleomorphism. This high-grade adenocarcinoma shows diffuse    positive staining for p16. Staining for p53 appears to demonstrate a null (mutated) pattern, which would rule out an HPV-associated endocervical adenocarcinoma and would favor the diagnosis of high-grade serous carcinoma. However, there is not an internal positive control, and therefore the stain cannot be interpreted as null with complete certainty. MMR stains were performed to possibly aid in diagnosing an endometrioid carcinoma, with no loss of MMR proteins.  The case has been sent for expert consultation and second opinion, with an addendum to follow.   ([facility] review below) A. 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 The ectocervical margin appears uninvolved. No lymphovascular invasion is identified. Chromogenic in situ hybridization for high risk HPV E6/7 mRNA (types 16, 18, 26, 31, 33, 35, 39, 45, 51, 52, 53, 56, 58, 59, 66, 68, 73 and 82) is negative in the cells of interest.  Immunohistochemistry was performed on blocks A2 and A5 at [facility] to characterize the pathologic process and demonstrates the following immunophenotype in the cells of interest: POSITIVE: HNF1B, AMACR, Napsin A, p16 (diffuse), Ki-67 elevated, NEGATIVE: ER, PR. This staining profile supports the above diagnosis.  B. Endocervix, curettage: Scant detached atypical cells with degenerative changes, suspicious for adenocarcinoma.  C. Endometrium, curettage: Clear cell adenocarcinoma, present in a minute fragment of stroma, see COMMENT. COMMENT: The sections demonstrate an infiltrative glandular proliferation of malignant cells with marked nuclear pleomorphism, undermining endocervical and ectocervical epithelium at the squamocolumnar junction. The lesion measures at least 8 mm in lateral extent and invades 1 mm of cervical stroma, but extension to the endocervical and deep margins precludes definitive measurement. Although the bulk of the lesion  appears in the cone biopsy, an endometrial primary cannot be excluded.  In view of pathology report and PET scan believe this is most likely a primary cervical clear cell adenocarcinoma and she started chemoradiation 05/11/20.  Medical history Menopause age 50.  No bleeding since.  No pelvic pain.   UTI a month ago and some vaginal discharge.   On peritoneal dialysis for about a year and stopped working. Takes iron shots for anemia.  Patient hospitalized three days for mild COVID19 pneumonia in February 2021 2/21 Carotid US 1. Bilateral carotid bifurcation plaque resulting in less than 50% diameter ICA stenosis. 2. Antegrade bilateral vertebral arterial flow. Cardiac Echo EF 60-65%  HIV 10/29/19- nonreactive  Oncology History  Cervical cancer ([place])  03/11/2020 Initial Diagnosis   Cervical cancer ([place])   03/30/2020 -  Chemotherapy   The patient had palonosetron (ALOXI) injection 0.25 mg, 0.25 mg, Intravenous,  Once, 0 of 7 cycles CISplatin (PLATINOL) 35 mg in sodium chloride 0.9 % 250 mL chemo infusion, 20 mg/m2 = 35 mg (100 % of original dose 20 mg/m2), Intravenous,  Once, 0 of 7 cycles Dose modification: 20 mg/m2 (original dose 20 mg/m2, Cycle 1, Reason: Other (see comments)) fosaprepitant (EMEND) 150 mg in sodium chloride 0.9 % 145 mL IVPB, 150 mg, Intravenous,  Once, 0 of 7 cycles  for chemotherapy treatment.      Problem List: Patient Active Problem List   Diagnosis Date Noted  . Goals of care, counseling/discussion 03/20/2020  . Cervical cancer ([place]) 03/11/2020  . Orthostatic hypotension   . Syncope 11/04/2019  .  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 Type 2 diabetes mellitus with other specified complication ([place]) 62/83/6629  . Peritoneal dialysis status ([place]) 11/03/2019  . Unable to care for self 11/03/2019  . Supine hypertension 11/03/2019  . Metabolic acidosis, increased anion gap (IAG)   . COVID-19   . Generalized weakness   . Recurrent  syncope 10/28/2019  . 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 Complication of vascular access for dialysis 05/11/2018  . Respiratory arrest ([place]) 05/11/2018  . Hyperlipidemia 04/12/2018  . Pancreatitis, recurrent 11/01/2017  . HTN (hypertension) 11/01/2017  . ESRD (end stage renal disease) ([place]) 11/01/2017  . Recurrent pancreatitis 11/01/2017    Past Medical History: Past Medical History:  Diagnosis Date  . Anemia 04/2018   low iron. to be started on supplements  . Cervical cancer ([place])   . CKD (chronic kidney disease)    Stage IV  . COVID-19 virus detected 10/28/2019  . Diabetes mellitus without complication ([place])    type II  . ESRD (end stage renal disease) ([place])   . Heart murmur    followed as a child only  . HSIL (high grade squamous intraepithelial lesion) on Pap smear of cervix   . Hyperlipidemia associated with type 2 diabetes mellitus ([place])   . Hypertension   . Pancreatitis   . Peripheral vascular disease [facility])     Past Surgical History: Past Surgical History:  Procedure Laterality Date  . AMPUTATION TOE Left 2013   2nd toe. tip of toe (toe nail was infected)  . AV FISTULA PLACEMENT Left 05/11/2018   Procedure: ARTERIOVENOUS (AV) FISTULA CREATION;  Surgeon: Katha Cabal, MD;  Location: [facility] ORS;  Service: Vascular;  Laterality: Left;  . CATARACT EXTRACTION    . CHOLECYSTECTOMY  2014  . COLONOSCOPY    . COLONOSCOPY WITH PROPOFOL N/A 01/10/2018   Procedure: COLONOSCOPY WITH PROPOFOL;  Surgeon: Toledo, Benay Pike, MD;  Location: [facility] ENDOSCOPY;  Service: Gastroenterology;  Laterality: N/A;  . DIALYSIS/PERMA CATHETER INSERTION N/A 05/21/2018   Procedure: DIALYSIS/PERMA CATHETER INSERTION;  Surgeon: Algernon Huxley, MD;  Location: [facility] CV LAB;  Service: Cardiovascular;  Laterality: N/A;  . DIALYSIS/PERMA CATHETER REMOVAL N/A 04/11/2019   Procedure: DIALYSIS/PERMA CATHETER REMOVAL;  Surgeon: Algernon Huxley, MD;  Location: [place] CV LAB;   Service: Cardiovascular;  Laterality: N/A;  . EYE SURGERY Bilateral 2018   cataract extractions  . THROMBECTOMY W/ EMBOLECTOMY  05/11/2018   Procedure: THROMBECTOMY ARTERIOVENOUS FISTULA;  Surgeon: Katha Cabal, MD;  Location: [facility] ORS;  Service: Vascular;;  . TUBAL LIGATION  1984   Family History: Family History  Problem Relation Age of Onset  . Stroke Mother   . Hypertension Mother   . Gout Mother   . Cancer Father   . Diabetes Sister   . Diabetes Maternal Grandmother   . Diabetes Maternal Grandfather   . Diabetes Paternal Grandmother   . Diabetes Paternal Grandfather     Social History: Social History   Socioeconomic History  . Marital status: Married    Spouse name: clarence  . Number of children: Not on file  . Years of education: Not on file  . Highest education level: Not on file  Occupational History    Comment: works nights  Tobacco Use  . Smoking status: Never Smoker  . Smokeless tobacco: Never Used  Vaping Use  . Vaping Use: Never used  Substance and Sexual Activity  . Alcohol use: No  . Drug use: Never  .  Sexual activity: Not Currently  Other Topics Concern  . Not on file  Social History Narrative  . Not on file   Social Determinants of Health   Financial Resource Strain:   . Difficulty of Paying Living Expenses:   Food Insecurity:   . Worried About Charity fundraiser in the Last Year:   . Arboriculturist in the Last Year:   Transportation Needs:   . Film/video editor (Medical):   Marland Kitchen Lack of Transportation (Non-Medical):   Physical Activity:   . Days of Exercise per Week:   . Minutes of Exercise per Session:   Stress:   . Feeling of Stress :   Social Connections:   . Frequency of Communication with Friends and Family:   . Frequency of Social Gatherings with Friends and Family:   . Attends Religious Services:   . Active Member of Clubs or Organizations:   . Attends Archivist Meetings:   Marland Kitchen Marital Status:   Intimate  Partner Violence:   . Fear of Current or Ex-Partner:   . Emotionally Abused:   Marland Kitchen Physically Abused:   . Sexually Abused:     Allergies: Allergies  Allergen Reactions  . Amlodipine Swelling    Knees down to ankles Knees down to ankles  . Hctz [Hydrochlorothiazide]     pancreatitis  . Heparin Other (See Comments)    Pt reports cardiac arrest when given heparin  . Lmw Heparin     Reports cardiac arrest when given heparin  . Other Other (See Comments)    Reports cardiac arrest when given during surgery Reports cardiac arrest when given during surgery Reports cardiac arrest when given during surgery   . Sulfa Antibiotics Rash  . Dairy Aid [Lactase]     Runny nose  . Hydralazine Nausea Only    Current Medications: Current Outpatient Medications  Medication Sig Dispense Refill  . calcitRIOL (ROCALTROL) 0.25 MCG capsule Take 0.25 mcg by mouth daily.    . calcium acetate (PHOSLO) 667 MG capsule Take 1,334 mg by mouth 2 (two) times daily with a meal.     . carvedilol (COREG) 25 MG tablet Take 25 mg by mouth 2 (two) times daily with a meal.    . clopidogrel (PLAVIX) 75 MG tablet Take 75 mg by mouth daily.    Marland Kitchen diltiazem (DILACOR XR) 120 MG 24 hr capsule Take 120 mg by mouth daily.    . prochlorperazine (COMPAZINE) 10 MG tablet Take 1 tablet (10 mg total) by mouth every 6 (six) hours as needed (Nausea or vomiting). 60 tablet 1   No current facility-administered medications for this visit.    Review of Systems General:  no complaints Skin: no complaints Eyes: no complaints HEENT: allergies Breasts: no complaints Pulmonary: no complaints Cardiac: chronic bilateral lower extremity edema.  Gastrointestinal: no complaints Genitourinary/Sexual: no complaints Ob/Gyn: no complaints Musculoskeletal: no complaints Hematology: no complaints Neurologic/Psych: no complaints   Objective:  Physical Examination:  BP (!) 178/82   Pulse 76   Temp 98.7 F (37.1 C)   Resp 20   Wt  158 lb 1.6 oz (71.7 kg)   SpO2 100%   BMI 28.01 kg/m     ECOG Performance Status: 2 - Symptomatic, <50% confined to bed  GENERAL: Patient is a well appearing female in no acute distress HEENT:  Sclera clear. Anicteric NODES:  Negative axillary, supraclavicular, inguinal lymph node survery LUNGS:  Clear to auscultation bilaterally HEART:  Regular rate and rhythm.  Systolic murmur.  ABDOMEN:  Soft, nontender.  Peritoneal dialysis catheter in place.  EXTREMITIES:  1+ bilateral lower extremity edema. Atraumatic. No cyanosis SKIN:  Clear with no obvious rashes or skin changes.  NEURO:  Nonfocal. Well oriented.  Appropriate affect.  Pelvic exam: EGBUS: normal.  Vagina normal: Cervix: Necrotic debris over cone bed and this was removed with swab.  Granulation tissue underneath. Bimanual: normal uterus. No masses.    Lab Review CMP Latest Ref Rng & Units 03/11/2020 11/08/2019 11/07/2019  Glucose 70 - 99 mg/dL 110(H) 210(H) 275(H)  BUN 8 - 23 mg/dL 48(H) 114(H) 106(H)  Creatinine 0.44 - 1.00 mg/dL 10.57(H) 13.84(H) 12.97(H)  Sodium 135 - 145 mmol/L 134(L) 133(L) 132(L)  Potassium 3.5 - 5.1 mmol/L 3.2(L) 3.8 3.9  Chloride 98 - 111 mmol/L 91(L) 93(L) 94(L)  CO2 22 - 32 mmol/L 27 21(L) 21(L)  Calcium 8.9 - 10.3 mg/dL 8.6(L) 6.8(L) 6.8(L)  Total Protein 6.5 - 8.1 g/dL 8.4(H) - -  Total Bilirubin 0.3 - 1.2 mg/dL 0.6 - -  Alkaline Phos 38 - 126 U/L 69 - -  AST 15 - 41 U/L 19 - -  ALT 0 - 44 U/L 16 - -      Chemistry      Component Value Date/Time   NA 134 (L) 03/11/2020 1109   NA 139 06/14/2014 0543   K 3.2 (L) 03/11/2020 1109   K 3.6 06/14/2014 0543   CL 91 (L) 03/11/2020 1109   CL 105 06/14/2014 0543   CO2 27 03/11/2020 1109   CO2 29 06/14/2014 0543   BUN 48 (H) 03/11/2020 1109   BUN 21 (H) 06/14/2014 0543   CREATININE 10.57 (H) 03/11/2020 1109   CREATININE 1.04 06/14/2014 0543      Component Value Date/Time   CALCIUM 8.6 (L) 03/11/2020 1109   CALCIUM 8.4 (L) 06/14/2014 0543    ALKPHOS 69 03/11/2020 1109   ALKPHOS 110 06/12/2014 2134   AST 19 03/11/2020 1109   AST 66 (H) 06/12/2014 2134   ALT 16 03/11/2020 1109   ALT 68 (H) 06/12/2014 2134   BILITOT 0.6 03/11/2020 1109   BILITOT 0.4 06/12/2014 2134     Lab Results  Component Value Date   WBC 5.5 03/11/2020   HGB 12.9 03/11/2020   HCT 41.1 03/11/2020   MCV 91.1 03/11/2020   PLT 304 03/11/2020      Assessment:  Atia D Hottel is a 64 y.o. female diagnosed with clear cell adenocarcinoma involving the cervix, unclear if cervical or uterine primary, but strongly favor cervical as this was detected by PAP/cervical biopsy with no bleeding and the bulk of the cancer is in the cervix based on PET scan and cone/D&C.  Clinically she has stage I disease, but difficult to assign substage.  The PET/CT did not really show a tumor mass in the cervix despite some FDG positivity there.  It could also be a primary endometrial cancer, and this cannot be ruled out completely.  No evidence of metastatic disease on PET. She has now started chemoradiation therapy for presumed cervical primary.    ESRD on peritoneal dialysis for a year and being considered for transplant. Renal failure associated anemia getting IV iron.  Medical co-morbidities complicating care: AODM (not on meds now, but BS high 6/22), HTN (systolic high today), ESRD on dialysis, carotid stenosis on Plavix and ASA  Plan:   Problem List Items Addressed This Visit      Genitourinary   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  [place]) - Primary     Cone biopsy bed healing slowly, and this is not surprising in view of her ESRD on dialysis.  She has started radiation this week and we will arrange for her to get first cycle of cisplatin radiosensitizing chemotherapy with Dr Grayland Ormond next week.  She will complete external radiation and weekly cisplatin Oct 6th and I will see her then for pelvic exam and possible reimaging.  Hope to proceed with brachytherapy at that point to  complete treatment of cervical adenocarcinoma, but if view of the uncertainty about the primary site (cervical vs uterine) want to consider all options at that point including surgery and chemotherapy.  The patient's diagnosis, an outline of the further diagnostic and laboratory studies which will be required, the recommendation, and alternatives were discussed.  All questions were answered to the patient's satisfaction.   Mellody Drown, MD

## 2020-05-14 ENCOUNTER — Ambulatory Visit
Admission: RE | Admit: 2020-05-14 | Discharge: 2020-05-14 | Disposition: A | Discharge status: Home / Self Care | Payer: BC Managed Care – PPO | Source: Ambulatory Visit | Attending: Radiation Oncology | Admitting: Radiation Oncology

## 2020-05-14 DIAGNOSIS — C539 Malignant neoplasm of cervix uteri, unspecified: Secondary | ICD-10-CM | POA: Diagnosis not present

## 2020-05-15 ENCOUNTER — Ambulatory Visit
Admission: RE | Admit: 2020-05-15 | Discharge: 2020-05-15 | Disposition: A | Discharge status: Home / Self Care | Payer: BC Managed Care – PPO | Source: Ambulatory Visit | Attending: Radiation Oncology | Admitting: Radiation Oncology

## 2020-05-15 DIAGNOSIS — C539 Malignant neoplasm of cervix uteri, unspecified: Secondary | ICD-10-CM | POA: Diagnosis not present

## 2020-05-16 NOTE — Progress Notes (Deleted)
[place]  [phone] Fax:(336) [phone]  ID: [name] OB: 1955/11/28  MR#: [id number]  CSN#:[id number]  Patient Care Team: Sharyne Peach, MD as PCP - General (Family Medicine) Wellington Hampshire, MD as PCP - Cardiology (Cardiology) Noreene Filbert, MD as Radiation Oncologist (Radiation Oncology) Clent Jacks, RN as Oncology Nurse Navigator  CHIEF COMPLAINT: Cervical cancer  INTERVAL HISTORY: Patient is a 64 year old female who was recently diagnosed with cervical cancer who is on home peritoneal dialysis for end-stage renal disease.  She is anxious, but otherwise feels well.  She has no neurologic complaints.  She denies any recent fevers or illnesses.  She has a good appetite and denies weight loss.  She has no chest pain, shortness of breath, cough, or hemoptysis.  She denies any nausea, vomiting, constipation, or diarrhea.  She has no urinary complaints.  Patient offers no further specific complaints today.  REVIEW OF SYSTEMS:   Review of Systems  Constitutional: Negative.  Negative for fever, malaise/fatigue and weight loss.  Respiratory: Negative.  Negative for cough, hemoptysis and shortness of breath.   Cardiovascular: Negative.  Negative for chest pain and leg swelling.  Gastrointestinal: Negative.  Negative for abdominal pain.  Genitourinary: Negative.  Negative for dysuria.  Musculoskeletal: Negative.  Negative for back pain.  Skin: Negative.  Negative for rash.  Neurological: Negative.  Negative for dizziness, focal weakness, weakness and headaches.  Psychiatric/Behavioral: The patient is nervous/anxious.     As per HPI. Otherwise, a complete review of systems is negative.  PAST MEDICAL HISTORY: Past Medical History:  Diagnosis Date  . Anemia 04/2018   low iron. to be started on supplements  . Cervical cancer ([place])   . CKD (chronic kidney disease)    Stage IV  . Complication of anesthesia    receceived too much  anesthesia, that she was in coma for a couple days   . COVID-19 virus detected 10/28/2019  . Diabetes mellitus without complication ([place])    type II  . ESRD (end stage renal disease) ([place])   . Heart murmur    followed as a child only  . HSIL (high grade squamous intraepithelial lesion) on Pap smear of cervix   . Hyperlipidemia associated with type 2 diabetes mellitus ([place])   . Hypertension   . Pancreatitis   . Peripheral vascular disease ([place])     PAST SURGICAL HISTORY: Past Surgical History:  Procedure Laterality Date  . AMPUTATION TOE Left 2013   2nd toe. tip of toe (toe nail was infected)  . AV FISTULA PLACEMENT Left 05/11/2018   Procedure: ARTERIOVENOUS (AV) FISTULA CREATION;  Surgeon: Katha Cabal, MD;  Location: [facility];  Service: Vascular;  Laterality: Left;  . CATARACT EXTRACTION    . CERVICAL CONIZATION W/BX N/A 04/08/2020   Procedure: CONIZATION CERVIX WITH BIOPSY;  Surgeon: Mellody Drown, MD;  Location: [facility];  Service: Gynecology;  Laterality: N/A;  . CHOLECYSTECTOMY  2014  . COLONOSCOPY    . COLONOSCOPY WITH PROPOFOL N/A 01/10/2018   Procedure: COLONOSCOPY WITH PROPOFOL;  Surgeon: Toledo, Benay Pike, MD;  Location: [facility];  Service: Gastroenterology;  Laterality: N/A;  . DIALYSIS/PERMA CATHETER INSERTION N/A 05/21/2018   Procedure: DIALYSIS/PERMA CATHETER INSERTION;  Surgeon: Algernon Huxley, MD;  Location: [place] [facility];  Service: Cardiovascular;  Laterality: N/A;  . DIALYSIS/PERMA CATHETER REMOVAL N/A 04/11/2019   Procedure: DIALYSIS/PERMA CATHETER REMOVAL;  Surgeon: Algernon Huxley, MD;  Location: [place] [facility];  Service: Cardiovascular;  Laterality: N/A;  . EYE SURGERY Bilateral 2018   cataract extractions  . THROMBECTOMY W/ EMBOLECTOMY  05/11/2018   Procedure: THROMBECTOMY ARTERIOVENOUS FISTULA;  Surgeon: Katha Cabal, MD;  Location: [facility];  Service: Vascular;;  . TUBAL LIGATION  1984    FAMILY HISTORY: Family History  Problem  Relation Age of Onset  . Stroke Mother   . Hypertension Mother   . Gout Mother   . Cancer Father   . Diabetes Sister   . Diabetes Maternal Grandmother   . Diabetes Maternal Grandfather   . Diabetes Paternal Grandmother   . Diabetes Paternal Grandfather     ADVANCED DIRECTIVES (Y/N):  N  HEALTH MAINTENANCE: Social History   Tobacco Use  . Smoking status: Never Smoker  . Smokeless tobacco: Never Used  Vaping Use  . Vaping Use: Never used  Substance Use Topics  . Alcohol use: No  . Drug use: Never     Colonoscopy:  PAP:  Bone density:  Lipid panel:  Allergies  Allergen Reactions  . Amlodipine Swelling    Knees down to ankles Knees down to ankles  . Hctz [Hydrochlorothiazide]     pancreatitis  . Heparin Other (See Comments)    Pt reports cardiac arrest when given heparin  . Lmw Heparin     Reports cardiac arrest when given heparin  . Other Other (See Comments)    Reports cardiac arrest when given during surgery Reports cardiac arrest when given during surgery Reports cardiac arrest when given during surgery   . Sulfa Antibiotics Rash  . Dairy Aid [Lactase]     Runny nose  . Hydralazine Nausea Only    Current Outpatient Medications  Medication Sig Dispense Refill  . calcitRIOL (ROCALTROL) 0.25 MCG capsule Take 0.25 mcg by mouth daily.    . calcium acetate (PHOSLO) 667 MG capsule Take 1,334 mg by mouth 2 (two) times daily with a meal.     . carvedilol (COREG) 25 MG tablet Take 25 mg by mouth 2 (two) times daily with a meal.    . clopidogrel (PLAVIX) 75 MG tablet Take 75 mg by mouth daily.     Marland Kitchen DILT-XR 180 MG 24 hr capsule Take 180 mg by mouth daily.    Marland Kitchen diltiazem (DILACOR XR) 120 MG 24 hr capsule Take 120 mg by mouth daily.    . potassium chloride SA (KLOR-CON) 20 MEQ tablet Take by mouth.    . prochlorperazine (COMPAZINE) 10 MG tablet Take 1 tablet (10 mg total) by mouth every 6 (six) hours as needed (Nausea or vomiting). (Patient not taking: Reported on  04/01/2020) 60 tablet 1   No current facility-administered medications for this visit.    OBJECTIVE: There were no vitals filed for this visit.   There is no height or weight on file to calculate BMI.    ECOG FS:0 - Asymptomatic  General: Well-developed, well-nourished, no acute distress. Eyes: Pink conjunctiva, anicteric sclera. HEENT: Normocephalic, moist mucous membranes. Lungs: No audible wheezing or coughing. Heart: Regular rate and rhythm. Abdomen: Soft, nontender, no obvious distention. Musculoskeletal: No edema, cyanosis, or clubbing. Neuro: Alert, answering all questions appropriately. Cranial nerves grossly intact. Skin: No rashes or petechiae noted. Psych: Normal affect. Lymphatics: No cervical, calvicular, axillary or inguinal LAD.   LAB RESULTS:  Lab Results  Component Value Date   NA 133 (L) 04/06/2020   K 3.2 (L) 04/08/2020   CL 91 (L) 04/06/2020   CO2 29 04/06/2020   GLUCOSE 113 (H) 04/06/2020  BUN 36 (H) 04/06/2020   CREATININE 8.50 (H) 04/06/2020   CALCIUM 7.6 (L) 04/06/2020   PROT 6.8 04/06/2020   ALBUMIN 2.7 (L) 04/06/2020   AST 12 (L) 04/06/2020   ALT 11 04/06/2020   ALKPHOS 47 04/06/2020   BILITOT 0.6 04/06/2020   GFRNONAA 5 (L) 04/06/2020   GFRAA 5 (L) 04/06/2020    Lab Results  Component Value Date   WBC 5.7 04/06/2020   NEUTROABS 2.8 03/11/2020   HGB 10.5 (L) 04/06/2020   HCT 31.7 (L) 04/06/2020   MCV 89.8 04/06/2020   PLT 264 04/06/2020     STUDIES: No results found.  ASSESSMENT: Cervical cancer.  PLAN:    1.  Cervical cancer: PET scan results from March 15, 2020 reviewed independently and report as above with low-level activity in the region of cervix corresponding to primary neoplasm.  Patient has some low activity in bilateral lymph nodes, but unclear if these are reactive or metastatic lesions.  She had consultation with radiation oncology earlier today.  Patient will benefit from concurrent radiation and chemotherapy.  Will  dose reduce cisplatin 50% secondary to her end-stage renal disease.  She will receive weekly treatments along with her XRT.  Return to clinic in approximately 2 weeks to initiate cycle 1 of weekly cisplatin. 2.  End-stage renal disease: Continue nightly peritoneal dialysis.  Patient reports that she is on the kidney transplant list.  This diagnosis or treatment should not change that status.    I spent a total of 60 minutes reviewing chart data, face-to-face evaluation with the patient, counseling and coordination of care as detailed above.   Patient expressed understanding and was in agreement with this plan. She also understands that She can call clinic at any time with any questions, concerns, or complaints.   Cancer Staging No matching staging information was found for the patient.  Lloyd Huger, MD   05/16/2020 10:25 AM

## 2020-05-19 ENCOUNTER — Inpatient Hospital Stay: Payer: BC Managed Care – PPO

## 2020-05-19 ENCOUNTER — Ambulatory Visit: Payer: BC Managed Care – PPO

## 2020-05-19 ENCOUNTER — Inpatient Hospital Stay: Payer: BC Managed Care – PPO | Admitting: Oncology

## 2020-05-19 ENCOUNTER — Telehealth: Payer: Self-pay | Admitting: Licensed Clinical Social Worker

## 2020-05-19 NOTE — Telephone Encounter (Signed)
Patient's husband called stating that she was not feeling well and had diarrhea. She stated that she could not make it for radiation this morning. Advised husband to have the patient call if she continues to have diarrhea. Patient was resting and he said he would check with her to see how she felt. Husband reported that patient took something for diarrhea but did not know what it was called.

## 2020-05-20 ENCOUNTER — Inpatient Hospital Stay: Payer: BC Managed Care – PPO

## 2020-05-20 ENCOUNTER — Ambulatory Visit
Admission: RE | Admit: 2020-05-20 | Discharge: 2020-05-20 | Disposition: A | Discharge status: Home / Self Care | Payer: BC Managed Care – PPO | Source: Ambulatory Visit | Attending: Radiation Oncology | Admitting: Radiation Oncology

## 2020-05-20 ENCOUNTER — Encounter: Payer: Self-pay | Admitting: Oncology

## 2020-05-20 DIAGNOSIS — C539 Malignant neoplasm of cervix uteri, unspecified: Secondary | ICD-10-CM | POA: Diagnosis not present

## 2020-05-21 ENCOUNTER — Inpatient Hospital Stay: Payer: BC Managed Care – PPO | Admitting: Oncology

## 2020-05-21 ENCOUNTER — Encounter: Payer: Self-pay | Admitting: Oncology

## 2020-05-21 ENCOUNTER — Inpatient Hospital Stay: Payer: BC Managed Care – PPO

## 2020-05-21 ENCOUNTER — Other Ambulatory Visit: Payer: Self-pay

## 2020-05-21 ENCOUNTER — Ambulatory Visit
Admission: RE | Admit: 2020-05-21 | Discharge: 2020-05-21 | Disposition: A | Discharge status: Home / Self Care | Payer: BC Managed Care – PPO | Source: Ambulatory Visit | Attending: Radiation Oncology | Admitting: Radiation Oncology

## 2020-05-21 ENCOUNTER — Inpatient Hospital Stay (HOSPITAL_BASED_OUTPATIENT_CLINIC_OR_DEPARTMENT_OTHER): Payer: BC Managed Care – PPO | Admitting: Oncology

## 2020-05-21 ENCOUNTER — Other Ambulatory Visit: Payer: BC Managed Care – PPO

## 2020-05-21 VITALS — BP 145/79 | HR 96 | Temp 97.0°F | Resp 16 | Wt 129.0 lb

## 2020-05-21 VITALS — BP 177/78

## 2020-05-21 DIAGNOSIS — C539 Malignant neoplasm of cervix uteri, unspecified: Secondary | ICD-10-CM | POA: Diagnosis not present

## 2020-05-21 DIAGNOSIS — C530 Malignant neoplasm of endocervix: Secondary | ICD-10-CM

## 2020-05-21 DIAGNOSIS — Z5111 Encounter for antineoplastic chemotherapy: Secondary | ICD-10-CM | POA: Diagnosis not present

## 2020-05-21 LAB — CBC WITH DIFFERENTIAL/PLATELET
Abs Immature Granulocytes: 0.02 10*3/uL (ref 0.00–0.07)
Basophils Absolute: 0.1 10*3/uL (ref 0.0–0.1)
Basophils Relative: 2 %
Eosinophils Absolute: 0.1 10*3/uL (ref 0.0–0.5)
Eosinophils Relative: 3 %
HCT: 27.3 % — ABNORMAL LOW (ref 36.0–46.0)
Hemoglobin: 9.2 g/dL — ABNORMAL LOW (ref 12.0–15.0)
Immature Granulocytes: 1 %
Lymphocytes Relative: 21 %
Lymphs Abs: 0.9 10*3/uL (ref 0.7–4.0)
MCH: 30.8 pg (ref 26.0–34.0)
MCHC: 33.7 g/dL (ref 30.0–36.0)
MCV: 91.3 fL (ref 80.0–100.0)
Monocytes Absolute: 0.4 10*3/uL (ref 0.1–1.0)
Monocytes Relative: 9 %
Neutro Abs: 2.6 10*3/uL (ref 1.7–7.7)
Neutrophils Relative %: 64 %
Platelets: 235 10*3/uL (ref 150–400)
RBC: 2.99 MIL/uL — ABNORMAL LOW (ref 3.87–5.11)
RDW: 14.1 % (ref 11.5–15.5)
WBC: 4 10*3/uL (ref 4.0–10.5)
nRBC: 0 % (ref 0.0–0.2)

## 2020-05-21 LAB — BASIC METABOLIC PANEL
Anion gap: 15 (ref 5–15)
BUN: 25 mg/dL — ABNORMAL HIGH (ref 8–23)
CO2: 28 mmol/L (ref 22–32)
Calcium: 7.8 mg/dL — ABNORMAL LOW (ref 8.9–10.3)
Chloride: 89 mmol/L — ABNORMAL LOW (ref 98–111)
Creatinine, Ser: 9.33 mg/dL — ABNORMAL HIGH (ref 0.44–1.00)
GFR calc Af Amer: 5 mL/min — ABNORMAL LOW (ref >60)
GFR calc non Af Amer: 4 mL/min — ABNORMAL LOW (ref >60)
Glucose, Bld: 110 mg/dL — ABNORMAL HIGH (ref 70–99)
Potassium: 2.4 mmol/L — CL (ref 3.5–5.1)
Sodium: 132 mmol/L — ABNORMAL LOW (ref 135–145)

## 2020-05-21 MED ORDER — POTASSIUM CHLORIDE 2 MEQ/ML IV SOLN
Freq: Once | INTRAVENOUS | Status: AC
  Filled 2020-05-21: qty 1000

## 2020-05-21 MED ORDER — PALONOSETRON HCL INJECTION 0.25 MG/5ML
0.2500 mg | Freq: Once | INTRAVENOUS | Status: AC
  Administered 2020-05-21: 0.25 mg via INTRAVENOUS
  Filled 2020-05-21: qty 5

## 2020-05-21 MED ORDER — SODIUM CHLORIDE 0.9 % IV SOLN
Freq: Once | INTRAVENOUS | Status: AC
  Filled 2020-05-21: qty 250

## 2020-05-21 MED ORDER — SODIUM CHLORIDE 0.9 % IV SOLN
150.0000 mg | Freq: Once | INTRAVENOUS | Status: AC
  Administered 2020-05-21: 150 mg via INTRAVENOUS
  Filled 2020-05-21: qty 150

## 2020-05-21 MED ORDER — SODIUM CHLORIDE 0.9 % IV SOLN
20.0000 mg/m2 | Freq: Once | INTRAVENOUS | Status: AC
  Administered 2020-05-21: 35 mg via INTRAVENOUS
  Filled 2020-05-21: qty 35

## 2020-05-21 MED ORDER — SODIUM CHLORIDE 0.9 % IV SOLN
10.0000 mg | Freq: Once | INTRAVENOUS | Status: AC
  Administered 2020-05-21: 10 mg via INTRAVENOUS
  Filled 2020-05-21: qty 10

## 2020-05-21 NOTE — Progress Notes (Signed)
Pt in for follow up, anxious about first chemo treatment today. Pt states she has not been taking BP meds and potassium because BP has been low and she has been dizzy.

## 2020-05-21 NOTE — Progress Notes (Signed)
[place]  Telephone:(336) [phone] Fax:(336) [phone]  ID: Tracy Jennings OB: Jul 04, 1956  MR#: [id number]  JSH#:[id number]  Patient Care Team: Sharyne Peach, MD as PCP - General (Family Medicine) Wellington Hampshire, MD as PCP - Cardiology (Cardiology) Noreene Filbert, MD as Radiation Oncologist (Radiation Oncology) Clent Jacks, RN as Oncology Nurse Navigator  CHIEF COMPLAINT: Cervical cancer  INTERVAL HISTORY: Patient was last seen on March 20, 2020.  After multiple evaluations by gynecology oncology as well as review of her pathology, it was determined that concurrent chemotherapy and XRT is in fact needed and she has been referred back for further evaluation and initiation of cycle 1 of weekly cisplatin.  She is on home peritoneal dialysis for end-stage renal disease.  She is anxious, but otherwise feels well.  She has no neurologic complaints.  She denies any recent fevers or illnesses.  She has a good appetite and denies weight loss.  She has no chest pain, shortness of breath, cough, or hemoptysis.  She denies any nausea, vomiting, constipation, or diarrhea.  She does not make urine.  Patient offers no specific complaints today.  REVIEW OF SYSTEMS:   Review of Systems  Constitutional: Negative.  Negative for fever, malaise/fatigue and weight loss.  Respiratory: Negative.  Negative for cough, hemoptysis and shortness of breath.   Cardiovascular: Negative.  Negative for chest pain and leg swelling.  Gastrointestinal: Negative.  Negative for abdominal pain.  Genitourinary: Negative.  Negative for dysuria.  Musculoskeletal: Negative.  Negative for back pain.  Skin: Negative.  Negative for rash.  Neurological: Negative.  Negative for dizziness, focal weakness, weakness and headaches.  Psychiatric/Behavioral: The patient is nervous/anxious.     As per HPI. Otherwise, a complete review of systems is negative.  PAST MEDICAL HISTORY: Past Medical History:    Diagnosis Date  . Anemia 04/2018   low iron. to be started on supplements  . Cervical cancer ([place])   . CKD (chronic kidney disease)    Stage IV  . Complication of anesthesia    receceived too much anesthesia, that she was in coma for a couple days   . COVID-19 virus detected 10/28/2019  . Diabetes mellitus without complication ([place])    type II  . ESRD (end stage renal disease) ([place])   . Heart murmur    followed as a child only  . HSIL (high grade squamous intraepithelial lesion) on Pap smear of cervix   . Hyperlipidemia associated with type 2 diabetes mellitus ([place])   . Hypertension   . Pancreatitis   . Peripheral vascular disease ([place])     PAST SURGICAL HISTORY: Past Surgical History:  Procedure Laterality Date  . AMPUTATION TOE Left 2013   2nd toe. tip of toe (toe nail was infected)  . AV FISTULA PLACEMENT Left 05/11/2018   Procedure: ARTERIOVENOUS (AV) FISTULA CREATION;  Surgeon: Katha Cabal, MD;  Location: [facility];  Service: Vascular;  Laterality: Left;  . CATARACT EXTRACTION    . CERVICAL CONIZATION W/BX N/A 04/08/2020   Procedure: CONIZATION CERVIX WITH BIOPSY;  Surgeon: Mellody Drown, MD;  Location: [facility];  Service: Gynecology;  Laterality: N/A;  . CHOLECYSTECTOMY  2014  . COLONOSCOPY    . COLONOSCOPY WITH PROPOFOL N/A 01/10/2018   Procedure: COLONOSCOPY WITH PROPOFOL;  Surgeon: Toledo, Benay Pike, MD;  Location: [facility];  Service: Gastroenterology;  Laterality: N/A;  . DIALYSIS/PERMA CATHETER INSERTION N/A 05/21/2018   Procedure: DIALYSIS/PERMA CATHETER INSERTION;  Surgeon: Algernon Huxley, MD;  Location: [place] [facility];  Service: Cardiovascular;  Laterality: N/A;  . DIALYSIS/PERMA CATHETER REMOVAL N/A 04/11/2019   Procedure: DIALYSIS/PERMA CATHETER REMOVAL;  Surgeon: Algernon Huxley, MD;  Location: [place] [facility];  Service: Cardiovascular;  Laterality: N/A;  . EYE SURGERY Bilateral 2018   cataract extractions  . THROMBECTOMY W/ EMBOLECTOMY   05/11/2018   Procedure: THROMBECTOMY ARTERIOVENOUS FISTULA;  Surgeon: Katha Cabal, MD;  Location: [facility];  Service: Vascular;;  . TUBAL LIGATION  1984    FAMILY HISTORY: Family History  Problem Relation Age of Onset  . Stroke Mother   . Hypertension Mother   . Gout Mother   . Cancer Father   . Diabetes Sister   . Diabetes Maternal Grandmother   . Diabetes Maternal Grandfather   . Diabetes Paternal Grandmother   . Diabetes Paternal Grandfather     ADVANCED DIRECTIVES (Y/N):  N  HEALTH MAINTENANCE: Social History   Tobacco Use  . Smoking status: Never Smoker  . Smokeless tobacco: Never Used  Vaping Use  . Vaping Use: Never used  Substance Use Topics  . Alcohol use: No  . Drug use: Never     Colonoscopy:  PAP:  Bone density:  Lipid panel:  Allergies  Allergen Reactions  . Amlodipine Swelling    Knees down to ankles Knees down to ankles  . Hctz [Hydrochlorothiazide]     pancreatitis  . Heparin Other (See Comments)    Pt reports cardiac arrest when given heparin  . Lmw Heparin     Reports cardiac arrest when given heparin  . Other Other (See Comments)    Reports cardiac arrest when given during surgery Reports cardiac arrest when given during surgery Reports cardiac arrest when given during surgery   . Sulfa Antibiotics Rash  . Dairy Aid [Lactase]     Runny nose  . Hydralazine Nausea Only    Current Outpatient Medications  Medication Sig Dispense Refill  . calcitRIOL (ROCALTROL) 0.25 MCG capsule Take 0.25 mcg by mouth daily.    . calcium acetate (PHOSLO) 667 MG capsule Take 1,334 mg by mouth 2 (two) times daily with a meal.     . DILT-XR 180 MG 24 hr capsule Take 180 mg by mouth daily.    . carvedilol (COREG) 25 MG tablet Take 25 mg by mouth 2 (two) times daily with a meal. (Patient not taking: Reported on 05/21/2020)    . clopidogrel (PLAVIX) 75 MG tablet Take 75 mg by mouth daily.  (Patient not taking: Reported on 05/21/2020)    . diltiazem  (DILACOR XR) 120 MG 24 hr capsule Take 120 mg by mouth daily. (Patient not taking: Reported on 05/21/2020)    . Potassium Chloride ER 20 MEQ TBCR Take 1 tablet by mouth daily. (Patient not taking: Reported on 05/21/2020)    . prochlorperazine (COMPAZINE) 10 MG tablet Take 1 tablet (10 mg total) by mouth every 6 (six) hours as needed (Nausea or vomiting). (Patient not taking: Reported on 05/21/2020) 60 tablet 1   No current facility-administered medications for this visit.    OBJECTIVE: Vitals:   05/21/20 0934  BP: (!) 145/79  Pulse: 96  Resp: 16  Temp: (!) 97 F (36.1 C)     Body mass index is 22.85 kg/m.    ECOG FS:0 - Asymptomatic  General: Well-developed, well-nourished, no acute distress. Eyes: Pink conjunctiva, anicteric sclera. HEENT: Normocephalic, moist mucous membranes. Lungs: No audible wheezing or coughing. Heart: Regular rate and rhythm. Abdomen: Soft, nontender,  no obvious distention. Musculoskeletal: No edema, cyanosis, or clubbing. Neuro: Alert, answering all questions appropriately. Cranial nerves grossly intact. Skin: No rashes or petechiae noted. Psych: Normal affect.    LAB RESULTS:  Lab Results  Component Value Date   NA 132 (L) 05/21/2020   K 2.4 (LL) 05/21/2020   CL 89 (L) 05/21/2020   CO2 28 05/21/2020   GLUCOSE 110 (H) 05/21/2020   BUN 25 (H) 05/21/2020   CREATININE 9.33 (H) 05/21/2020   CALCIUM 7.8 (L) 05/21/2020   PROT 6.8 04/06/2020   ALBUMIN 2.7 (L) 04/06/2020   AST 12 (L) 04/06/2020   ALT 11 04/06/2020   ALKPHOS 47 04/06/2020   BILITOT 0.6 04/06/2020   GFRNONAA 4 (L) 05/21/2020   GFRAA 5 (L) 05/21/2020    Lab Results  Component Value Date   WBC 4.0 05/21/2020   NEUTROABS 2.6 05/21/2020   HGB 9.2 (L) 05/21/2020   HCT 27.3 (L) 05/21/2020   MCV 91.3 05/21/2020   PLT 235 05/21/2020     STUDIES: No results found.  ASSESSMENT: Cervical cancer.  PLAN:    1.  Cervical cancer: PET scan results from March 15, 2020 reviewed  independently with low-level activity in the region of cervix corresponding to primary neoplasm.  Patient has some low activity in bilateral lymph nodes, but unclear if these are reactive or metastatic lesions.  She will benefit from concurrent radiation and chemotherapy.  Will dose reduce cisplatin 50% secondary to her end-stage renal disease.  Continue daily XRT.  Proceed with cycle 1 of weekly cisplatin.  Return to clinic in 1 week for further evaluation and consideration of cycle 2. 2.  End-stage renal disease: Continue nightly peritoneal dialysis.  Patient reports that she is on the kidney transplant list.  This diagnosis or treatment should not change that status.  Case has been discussed with nephrology. 3.  Hypokalemia: Patient received 40 mEq IV potassium today.  Continue oral potassium supplementation as prescribed. 4.  Anemia: Chronic and unchanged.  Patient receives Procrit with dialysis.  I spent a total of 30 minutes reviewing chart data, face-to-face evaluation with the patient, counseling and coordination of care as detailed above.   Patient expressed understanding and was in agreement with this plan. She also understands that She can call clinic at any time with any questions, concerns, or complaints.   Cancer Staging No matching staging information was found for the patient.  Lloyd Huger, MD   05/21/2020 6:51 PM

## 2020-05-21 NOTE — Progress Notes (Signed)
Per Dr. Grayland Ormond, okay to proceed with treatment despite elevated Creatinine level and patient being unable to produce urine output. Potassium 40 mEq to be added to pre/post fluids as well for low potassium level.    Patient's final BP post treatment, 177/78. This is patient's baseline. Patient denies any s/s at this time and is stable. Patient educated as to when to contact emergency services. Patient verbalizes understanding and denies any further questions or concerns. Patient discharged home.

## 2020-05-21 NOTE — Progress Notes (Signed)
Give total of 37meq potassium in pre/post fluids today per MD.  Do not wait on UOP for cisplatin since patient is on dialysis per MD>

## 2020-05-22 ENCOUNTER — Telehealth: Payer: Self-pay

## 2020-05-22 ENCOUNTER — Ambulatory Visit
Admission: RE | Admit: 2020-05-22 | Discharge: 2020-05-22 | Disposition: A | Discharge status: Home / Self Care | Payer: BC Managed Care – PPO | Source: Ambulatory Visit | Attending: Radiation Oncology | Admitting: Radiation Oncology

## 2020-05-22 DIAGNOSIS — C539 Malignant neoplasm of cervix uteri, unspecified: Secondary | ICD-10-CM | POA: Diagnosis not present

## 2020-05-22 NOTE — Telephone Encounter (Signed)
Referral sent to Dr. Christel Mormon, radiation oncology.

## 2020-05-22 NOTE — Telephone Encounter (Signed)
T/C to pt for follow up after receiving first chemo yesterday.  No answer but left message letting her know we were calling to check on her.  Encouraged pt to call for nay questions or concerns.

## 2020-05-24 NOTE — Progress Notes (Signed)
[place]  [phone]) [phone] Fax:(336) [phone]  ID: [name] OB: 1955-11-17  MR#: [id number]  UVO#:[id number]  Patient Care Team: Sharyne Peach, MD as PCP - General (Family Medicine) Wellington Hampshire, MD as PCP - Cardiology (Cardiology) Noreene Filbert, MD as Radiation Oncologist (Radiation Oncology) Clent Jacks, RN as Oncology Nurse Navigator  CHIEF COMPLAINT: Cervical cancer  INTERVAL HISTORY: Patient returns to clinic today for further evaluation and consideration of cycle 2 of weekly cisplatin.  She tolerated her first infusion well without significant side effects.  She is tolerating daily XRT. She is on home peritoneal dialysis for end-stage renal disease.  She feels mildly lightheaded today.  She has no other neurologic complaints.  She denies any recent fevers or illnesses.  She has a good appetite and denies weight loss.  She has no chest pain, shortness of breath, cough, or hemoptysis.  She denies any nausea, vomiting, constipation, or diarrhea.  She does not make urine.  Patient offers no further specific complaints today.  REVIEW OF SYSTEMS:   Review of Systems  Constitutional: Positive for malaise/fatigue. Negative for fever and weight loss.  Respiratory: Negative.  Negative for cough, hemoptysis and shortness of breath.   Cardiovascular: Negative.  Negative for chest pain and leg swelling.  Gastrointestinal: Negative.  Negative for abdominal pain.  Genitourinary: Negative.  Negative for dysuria.  Musculoskeletal: Negative.  Negative for back pain.  Skin: Negative.  Negative for rash.  Neurological: Negative.  Negative for dizziness, focal weakness, weakness and headaches.  Psychiatric/Behavioral: Negative.  The patient is not nervous/anxious.     As per HPI. Otherwise, a complete review of systems is negative.  PAST MEDICAL HISTORY: Past Medical History:  Diagnosis Date  . Anemia 04/2018   low iron. to be started on  supplements  . Cervical cancer ([place])   . CKD (chronic kidney disease)    Stage IV  . Complication of anesthesia    receceived too much anesthesia, that she was in coma for a couple days   . COVID-19 virus detected 10/28/2019  . Diabetes mellitus without complication ([place])    type II  . ESRD (end stage renal disease) ([place])   . Heart murmur    followed as a child only  . HSIL (high grade squamous intraepithelial lesion) on Pap smear of cervix   . Hyperlipidemia associated with type 2 diabetes mellitus ([place])   . Hypertension   . Pancreatitis   . Peripheral vascular disease ([place])     PAST SURGICAL HISTORY: Past Surgical History:  Procedure Laterality Date  . AMPUTATION TOE Left 2013   2nd toe. tip of toe (toe nail was infected)  . AV FISTULA PLACEMENT Left 05/11/2018   Procedure: ARTERIOVENOUS (AV) FISTULA CREATION;  Surgeon: Katha Cabal, MD;  Location: [facility] ORS;  Service: Vascular;  Laterality: Left;  . CATARACT EXTRACTION    . CERVICAL CONIZATION W/BX N/A 04/08/2020   Procedure: CONIZATION CERVIX WITH BIOPSY;  Surgeon: Mellody Drown, MD;  Location: [facility] ORS;  Service: Gynecology;  Laterality: N/A;  . CHOLECYSTECTOMY  2014  . COLONOSCOPY    . COLONOSCOPY WITH PROPOFOL N/A 01/10/2018   Procedure: COLONOSCOPY WITH PROPOFOL;  Surgeon: Toledo, Benay Pike, MD;  Location: [facility] ENDOSCOPY;  Service: Gastroenterology;  Laterality: N/A;  . DIALYSIS/PERMA CATHETER INSERTION N/A 05/21/2018   Procedure: DIALYSIS/PERMA CATHETER INSERTION;  Surgeon: Algernon Huxley, MD;  Location: [place] [facility];  Service: Cardiovascular;  Laterality: N/A;  . DIALYSIS/PERMA CATHETER REMOVAL N/A  04/11/2019   Procedure: DIALYSIS/PERMA CATHETER REMOVAL;  Surgeon: Algernon Huxley, MD;  Location: [place] [facility];  Service: Cardiovascular;  Laterality: N/A;  . EYE SURGERY Bilateral 2018   cataract extractions  . THROMBECTOMY W/ EMBOLECTOMY  05/11/2018   Procedure: THROMBECTOMY ARTERIOVENOUS FISTULA;  Surgeon:  Katha Cabal, MD;  Location: [facility] ORS;  Service: Vascular;;  . TUBAL LIGATION  1984    FAMILY HISTORY: Family History  Problem Relation Age of Onset  . Stroke Mother   . Hypertension Mother   . Gout Mother   . Cancer Father   . Diabetes Sister   . Diabetes Maternal Grandmother   . Diabetes Maternal Grandfather   . Diabetes Paternal Grandmother   . Diabetes Paternal Grandfather     ADVANCED DIRECTIVES (Y/N):  N  HEALTH MAINTENANCE: Social History   Tobacco Use  . Smoking status: Never Smoker  . Smokeless tobacco: Never Used  Vaping Use  . Vaping Use: Never used  Substance Use Topics  . Alcohol use: No  . Drug use: Never     Colonoscopy:  PAP:  Bone density:  Lipid panel:  Allergies  Allergen Reactions  . Amlodipine Swelling    Knees down to ankles Knees down to ankles  . Hctz [Hydrochlorothiazide]     pancreatitis  . Heparin Other (See Comments)    Pt reports cardiac arrest when given heparin  . Lmw Heparin     Reports cardiac arrest when given heparin  . Other Other (See Comments)    Reports cardiac arrest when given during surgery Reports cardiac arrest when given during surgery Reports cardiac arrest when given during surgery   . Sulfa Antibiotics Rash  . Dairy Aid [Lactase]     Runny nose  . Hydralazine Nausea Only    Current Outpatient Medications  Medication Sig Dispense Refill  . calcitRIOL (ROCALTROL) 0.25 MCG capsule Take 0.25 mcg by mouth daily.    . calcium acetate (PHOSLO) 667 MG capsule Take 1,334 mg by mouth 2 (two) times daily with a meal.     . DILT-XR 180 MG 24 hr capsule Take 180 mg by mouth daily.    . carvedilol (COREG) 25 MG tablet Take 25 mg by mouth 2 (two) times daily with a meal. (Patient not taking: Reported on 05/21/2020)    . clopidogrel (PLAVIX) 75 MG tablet Take 75 mg by mouth daily.  (Patient not taking: Reported on 05/21/2020)    . diltiazem (DILACOR XR) 120 MG 24 hr capsule Take 120 mg by mouth daily. (Patient not  taking: Reported on 05/21/2020)    . Potassium Chloride ER 20 MEQ TBCR Take 1 tablet by mouth daily. (Patient not taking: Reported on 05/21/2020)    . prochlorperazine (COMPAZINE) 10 MG tablet Take 1 tablet (10 mg total) by mouth every 6 (six) hours as needed (Nausea or vomiting). (Patient not taking: Reported on 05/21/2020) 60 tablet 1   No current facility-administered medications for this visit.   Facility-Administered Medications Ordered in Other Visits  Medication Dose Route Frequency Provider Last Rate Last Admin  . CISplatin (PLATINOL) 35 mg in sodium chloride 0.9 % 250 mL chemo infusion  20 mg/m2 (Treatment Plan Recorded) Intravenous Once Lloyd Huger, MD      . dexamethasone (DECADRON) 10 mg in sodium chloride 0.9 % 50 mL IVPB  10 mg Intravenous Once Lloyd Huger, MD      . dextrose 5 % and 0.45% NaCl 1,000 mL with potassium chloride 40 mEq,  magnesium sulfate 12 mEq infusion   Intravenous Once Chrystal, Eulas Post, MD      . fosaprepitant (EMEND) 150 mg in sodium chloride 0.9 % 145 mL IVPB  150 mg Intravenous Once Lloyd Huger, MD      . palonosetron (ALOXI) injection 0.25 mg  0.25 mg Intravenous Once Lloyd Huger, MD        OBJECTIVE: Vitals:   05/28/20 0908  BP: (!) 159/83  Pulse: 93  Resp: 20  Temp: (!) 97.4 F (36.3 C)  SpO2: 100%     Body mass index is 23.4 kg/m.    ECOG FS:0 - Asymptomatic  General: Well-developed, well-nourished, no acute distress. Eyes: Pink conjunctiva, anicteric sclera. HEENT: Normocephalic, moist mucous membranes. Lungs: No audible wheezing or coughing. Heart: Regular rate and rhythm. Abdomen: Soft, nontender, no obvious distention. Musculoskeletal: No edema, cyanosis, or clubbing. Neuro: Alert, answering all questions appropriately. Cranial nerves grossly intact. Skin: No rashes or petechiae noted. Psych: Normal affect.   LAB RESULTS:  Lab Results  Component Value Date   NA 128 (L) 05/28/2020   K 2.6 (LL) 05/28/2020    CL 88 (L) 05/28/2020   CO2 28 05/28/2020   GLUCOSE 121 (H) 05/28/2020   BUN 29 (H) 05/28/2020   CREATININE 8.45 (H) 05/28/2020   CALCIUM 7.4 (L) 05/28/2020   PROT 6.8 04/06/2020   ALBUMIN 2.7 (L) 04/06/2020   AST 12 (L) 04/06/2020   ALT 11 04/06/2020   ALKPHOS 47 04/06/2020   BILITOT 0.6 04/06/2020   GFRNONAA 5 (L) 05/28/2020   GFRAA 5 (L) 05/28/2020    Lab Results  Component Value Date   WBC 4.7 05/28/2020   NEUTROABS 3.8 05/28/2020   HGB 8.1 (L) 05/28/2020   HCT 23.7 (L) 05/28/2020   MCV 90.1 05/28/2020   PLT 187 05/28/2020     STUDIES: No results found.  ASSESSMENT: Cervical cancer.  PLAN:    1.  Cervical cancer: PET scan results from March 15, 2020 reviewed independently with low-level activity in the region of cervix corresponding to primary neoplasm.  Patient has some low activity in bilateral lymph nodes, but unclear if these are reactive or metastatic lesions.  She will benefit from concurrent radiation and chemotherapy.  Will dose reduce cisplatin 50% secondary to her end-stage renal disease.  Continue daily XRT.  Proceed with cycle 2 of weekly cisplatin.  Return to clinic in 1 week for further evaluation and consideration of cycle 3.   2.  End-stage renal disease: Continue nightly peritoneal dialysis.  Patient reports that she is on the kidney transplant list.  This diagnosis or treatment should not change that status.  Case has been discussed with nephrology. 3.  Hypokalemia: Potassium remains significantly decreased, but improved.  Proceed with 40 mEq IV potassium today.  Continue oral potassium supplementation as prescribed. 4.  Anemia: Hemoglobin is trended down to 8.1.  Monitor.  Patient receives Procrit with her dialysis.  I spent a total of 30 minutes reviewing chart data, face-to-face evaluation with the patient, counseling and coordination of care as detailed above.   Patient expressed understanding and was in agreement with this plan. She also understands  that She can call clinic at any time with any questions, concerns, or complaints.   Cancer Staging No matching staging information was found for the patient.  Lloyd Huger, MD   05/28/2020 10:10 AM

## 2020-05-25 ENCOUNTER — Ambulatory Visit
Admission: RE | Admit: 2020-05-25 | Discharge: 2020-05-25 | Disposition: A | Discharge status: Home / Self Care | Payer: BC Managed Care – PPO | Source: Ambulatory Visit | Attending: Radiation Oncology | Admitting: Radiation Oncology

## 2020-05-25 DIAGNOSIS — C539 Malignant neoplasm of cervix uteri, unspecified: Secondary | ICD-10-CM | POA: Diagnosis not present

## 2020-05-26 ENCOUNTER — Ambulatory Visit
Admission: RE | Admit: 2020-05-26 | Discharge: 2020-05-26 | Disposition: A | Discharge status: Home / Self Care | Payer: BC Managed Care – PPO | Source: Ambulatory Visit | Attending: Radiation Oncology | Admitting: Radiation Oncology

## 2020-05-26 ENCOUNTER — Telehealth: Payer: Self-pay | Admitting: *Deleted

## 2020-05-26 DIAGNOSIS — C539 Malignant neoplasm of cervix uteri, unspecified: Secondary | ICD-10-CM | POA: Diagnosis not present

## 2020-05-26 NOTE — Telephone Encounter (Signed)
Patient having severe watery diarrhea yesterday and still some diarrhea today.  Tried to have patient see [facility] both yesterday and today.   She was not able to stay either day.  She said her diarrhea has improved some with imodium use.

## 2020-05-27 ENCOUNTER — Ambulatory Visit
Admission: RE | Admit: 2020-05-27 | Discharge: 2020-05-27 | Disposition: A | Discharge status: Home / Self Care | Payer: BC Managed Care – PPO | Source: Ambulatory Visit | Attending: Radiation Oncology | Admitting: Radiation Oncology

## 2020-05-27 DIAGNOSIS — C539 Malignant neoplasm of cervix uteri, unspecified: Secondary | ICD-10-CM | POA: Diagnosis not present

## 2020-05-28 ENCOUNTER — Encounter: Payer: Self-pay | Admitting: Oncology

## 2020-05-28 ENCOUNTER — Inpatient Hospital Stay (HOSPITAL_BASED_OUTPATIENT_CLINIC_OR_DEPARTMENT_OTHER): Payer: BC Managed Care – PPO | Admitting: Oncology

## 2020-05-28 ENCOUNTER — Inpatient Hospital Stay: Payer: BC Managed Care – PPO

## 2020-05-28 ENCOUNTER — Other Ambulatory Visit: Payer: Self-pay

## 2020-05-28 ENCOUNTER — Other Ambulatory Visit: Payer: Self-pay | Admitting: *Deleted

## 2020-05-28 ENCOUNTER — Other Ambulatory Visit: Payer: BC Managed Care – PPO

## 2020-05-28 ENCOUNTER — Ambulatory Visit
Admission: RE | Admit: 2020-05-28 | Discharge: 2020-05-28 | Disposition: A | Discharge status: Home / Self Care | Payer: BC Managed Care – PPO | Source: Ambulatory Visit | Attending: Radiation Oncology | Admitting: Radiation Oncology

## 2020-05-28 VITALS — BP 159/83 | HR 93 | Temp 97.4°F | Resp 20 | Wt 132.1 lb

## 2020-05-28 DIAGNOSIS — C530 Malignant neoplasm of endocervix: Secondary | ICD-10-CM

## 2020-05-28 DIAGNOSIS — C539 Malignant neoplasm of cervix uteri, unspecified: Secondary | ICD-10-CM | POA: Diagnosis not present

## 2020-05-28 DIAGNOSIS — Z5111 Encounter for antineoplastic chemotherapy: Secondary | ICD-10-CM | POA: Diagnosis not present

## 2020-05-28 LAB — BASIC METABOLIC PANEL
Anion gap: 12 (ref 5–15)
BUN: 29 mg/dL — ABNORMAL HIGH (ref 8–23)
CO2: 28 mmol/L (ref 22–32)
Calcium: 7.4 mg/dL — ABNORMAL LOW (ref 8.9–10.3)
Chloride: 88 mmol/L — ABNORMAL LOW (ref 98–111)
Creatinine, Ser: 8.45 mg/dL — ABNORMAL HIGH (ref 0.44–1.00)
GFR calc Af Amer: 5 mL/min — ABNORMAL LOW (ref >60)
GFR calc non Af Amer: 5 mL/min — ABNORMAL LOW (ref >60)
Glucose, Bld: 121 mg/dL — ABNORMAL HIGH (ref 70–99)
Potassium: 2.6 mmol/L — CL (ref 3.5–5.1)
Sodium: 128 mmol/L — ABNORMAL LOW (ref 135–145)

## 2020-05-28 LAB — CBC WITH DIFFERENTIAL/PLATELET
Abs Immature Granulocytes: 0.02 10*3/uL (ref 0.00–0.07)
Basophils Absolute: 0 10*3/uL (ref 0.0–0.1)
Basophils Relative: 1 %
Eosinophils Absolute: 0.1 10*3/uL (ref 0.0–0.5)
Eosinophils Relative: 2 %
HCT: 23.7 % — ABNORMAL LOW (ref 36.0–46.0)
Hemoglobin: 8.1 g/dL — ABNORMAL LOW (ref 12.0–15.0)
Immature Granulocytes: 0 %
Lymphocytes Relative: 7 %
Lymphs Abs: 0.3 10*3/uL — ABNORMAL LOW (ref 0.7–4.0)
MCH: 30.8 pg (ref 26.0–34.0)
MCHC: 34.2 g/dL (ref 30.0–36.0)
MCV: 90.1 fL (ref 80.0–100.0)
Monocytes Absolute: 0.5 10*3/uL (ref 0.1–1.0)
Monocytes Relative: 10 %
Neutro Abs: 3.8 10*3/uL (ref 1.7–7.7)
Neutrophils Relative %: 80 %
Platelets: 187 10*3/uL (ref 150–400)
RBC: 2.63 MIL/uL — ABNORMAL LOW (ref 3.87–5.11)
RDW: 14 % (ref 11.5–15.5)
WBC: 4.7 10*3/uL (ref 4.0–10.5)
nRBC: 0 % (ref 0.0–0.2)

## 2020-05-28 MED ORDER — SODIUM CHLORIDE 0.9 % IV SOLN
20.0000 mg/m2 | Freq: Once | INTRAVENOUS | Status: AC
  Administered 2020-05-28: 35 mg via INTRAVENOUS
  Filled 2020-05-28: qty 35

## 2020-05-28 MED ORDER — SODIUM CHLORIDE 0.9 % IV SOLN
150.0000 mg | Freq: Once | INTRAVENOUS | Status: AC
  Administered 2020-05-28: 150 mg via INTRAVENOUS
  Filled 2020-05-28: qty 150

## 2020-05-28 MED ORDER — PALONOSETRON HCL INJECTION 0.25 MG/5ML
0.2500 mg | Freq: Once | INTRAVENOUS | Status: AC
  Administered 2020-05-28: 0.25 mg via INTRAVENOUS
  Filled 2020-05-28: qty 5

## 2020-05-28 MED ORDER — SODIUM CHLORIDE 0.9 % IV SOLN
10.0000 mg | Freq: Once | INTRAVENOUS | Status: AC
  Administered 2020-05-28: 10 mg via INTRAVENOUS
  Filled 2020-05-28: qty 10

## 2020-05-28 MED ORDER — POTASSIUM CHLORIDE 2 MEQ/ML IV SOLN
Freq: Once | INTRAVENOUS | Status: AC
  Filled 2020-05-28: qty 1000

## 2020-05-28 MED ORDER — SODIUM CHLORIDE 0.9 % IV SOLN
Freq: Once | INTRAVENOUS | Status: AC
  Filled 2020-05-28: qty 250

## 2020-05-28 NOTE — Progress Notes (Signed)
Patient denies any pain at this time. Just states she has been having issues with low blood pressure. Today BP was not low at 159/83. No other questions at this time.

## 2020-05-29 ENCOUNTER — Telehealth: Payer: Self-pay | Admitting: *Deleted

## 2020-05-29 ENCOUNTER — Ambulatory Visit
Admission: RE | Admit: 2020-05-29 | Discharge: 2020-05-29 | Disposition: A | Discharge status: Home / Self Care | Payer: BC Managed Care – PPO | Source: Ambulatory Visit | Attending: Radiation Oncology | Admitting: Radiation Oncology

## 2020-05-29 DIAGNOSIS — C539 Malignant neoplasm of cervix uteri, unspecified: Secondary | ICD-10-CM | POA: Diagnosis not present

## 2020-05-29 NOTE — Telephone Encounter (Signed)
Call placed to patient to discuss appointment for port placement. Patient is scheduled for IR port placement on Friday 9/24. Patient is scheduled for radiation at 9:45 and will go over for port placement directly after treatment, tentatively scheduled to begin at 11:00. Patient verbalized understanding of plan.

## 2020-05-31 NOTE — Progress Notes (Signed)
  [place]  Telephone:(336) [phone] Fax:(336) [phone]  ID: [name] OB: 1956/07/12  MR#: [id number]  ZFP#:[id number]  Patient Care Team: Sharyne Peach, MD as PCP - General (Family Medicine) Wellington Hampshire, MD as PCP - Cardiology (Cardiology) Noreene Filbert, MD as Radiation Oncologist (Radiation Oncology) Clent Jacks, RN as Oncology Nurse Navigator   Lloyd Huger, MD   06/05/2020 12:06 PM     This encounter was created in error - please disregard.

## 2020-06-01 ENCOUNTER — Ambulatory Visit
Admission: RE | Admit: 2020-06-01 | Discharge: 2020-06-01 | Disposition: A | Discharge status: Home / Self Care | Payer: BC Managed Care – PPO | Source: Ambulatory Visit | Attending: Radiation Oncology | Admitting: Radiation Oncology

## 2020-06-01 DIAGNOSIS — C539 Malignant neoplasm of cervix uteri, unspecified: Secondary | ICD-10-CM | POA: Diagnosis not present

## 2020-06-02 ENCOUNTER — Ambulatory Visit
Admission: RE | Admit: 2020-06-02 | Discharge: 2020-06-02 | Disposition: A | Discharge status: Home / Self Care | Payer: BC Managed Care – PPO | Source: Ambulatory Visit | Attending: Radiation Oncology | Admitting: Radiation Oncology

## 2020-06-02 DIAGNOSIS — C539 Malignant neoplasm of cervix uteri, unspecified: Secondary | ICD-10-CM | POA: Diagnosis not present

## 2020-06-03 ENCOUNTER — Ambulatory Visit
Admission: RE | Admit: 2020-06-03 | Discharge: 2020-06-03 | Disposition: A | Discharge status: Home / Self Care | Payer: BC Managed Care – PPO | Source: Ambulatory Visit | Attending: Radiation Oncology | Admitting: Radiation Oncology

## 2020-06-03 DIAGNOSIS — C539 Malignant neoplasm of cervix uteri, unspecified: Secondary | ICD-10-CM | POA: Diagnosis not present

## 2020-06-04 ENCOUNTER — Other Ambulatory Visit: Payer: Self-pay | Admitting: Radiology

## 2020-06-04 ENCOUNTER — Ambulatory Visit
Admission: RE | Admit: 2020-06-04 | Discharge: 2020-06-04 | Disposition: A | Discharge status: Home / Self Care | Payer: BC Managed Care – PPO | Source: Ambulatory Visit | Attending: Radiation Oncology | Admitting: Radiation Oncology

## 2020-06-04 ENCOUNTER — Other Ambulatory Visit: Payer: BC Managed Care – PPO

## 2020-06-04 ENCOUNTER — Inpatient Hospital Stay: Payer: BC Managed Care – PPO | Admitting: Oncology

## 2020-06-04 ENCOUNTER — Inpatient Hospital Stay: Payer: BC Managed Care – PPO

## 2020-06-04 DIAGNOSIS — C539 Malignant neoplasm of cervix uteri, unspecified: Secondary | ICD-10-CM | POA: Diagnosis not present

## 2020-06-05 ENCOUNTER — Other Ambulatory Visit: Payer: Self-pay | Admitting: Oncology

## 2020-06-05 ENCOUNTER — Ambulatory Visit
Admission: RE | Admit: 2020-06-05 | Discharge: 2020-06-05 | Disposition: A | Discharge status: Home / Self Care | Payer: BC Managed Care – PPO | Source: Ambulatory Visit | Attending: Radiation Oncology | Admitting: Radiation Oncology

## 2020-06-05 ENCOUNTER — Other Ambulatory Visit: Payer: Self-pay | Admitting: *Deleted

## 2020-06-05 ENCOUNTER — Ambulatory Visit
Admission: RE | Admit: 2020-06-05 | Discharge: 2020-06-05 | Disposition: A | Discharge status: Home / Self Care | Payer: BC Managed Care – PPO | Source: Ambulatory Visit | Attending: Oncology | Admitting: Oncology

## 2020-06-05 ENCOUNTER — Other Ambulatory Visit: Payer: Self-pay

## 2020-06-05 DIAGNOSIS — Z79899 Other long term (current) drug therapy: Secondary | ICD-10-CM | POA: Insufficient documentation

## 2020-06-05 DIAGNOSIS — Z882 Allergy status to sulfonamides status: Secondary | ICD-10-CM | POA: Diagnosis not present

## 2020-06-05 DIAGNOSIS — E785 Hyperlipidemia, unspecified: Secondary | ICD-10-CM | POA: Insufficient documentation

## 2020-06-05 DIAGNOSIS — Z888 Allergy status to other drugs, medicaments and biological substances status: Secondary | ICD-10-CM | POA: Insufficient documentation

## 2020-06-05 DIAGNOSIS — Z7902 Long term (current) use of antithrombotics/antiplatelets: Secondary | ICD-10-CM | POA: Diagnosis not present

## 2020-06-05 DIAGNOSIS — Z9221 Personal history of antineoplastic chemotherapy: Secondary | ICD-10-CM | POA: Insufficient documentation

## 2020-06-05 DIAGNOSIS — E1151 Type 2 diabetes mellitus with diabetic peripheral angiopathy without gangrene: Secondary | ICD-10-CM | POA: Diagnosis not present

## 2020-06-05 DIAGNOSIS — Z8616 Personal history of COVID-19: Secondary | ICD-10-CM | POA: Diagnosis not present

## 2020-06-05 DIAGNOSIS — D631 Anemia in chronic kidney disease: Secondary | ICD-10-CM | POA: Diagnosis not present

## 2020-06-05 DIAGNOSIS — C539 Malignant neoplasm of cervix uteri, unspecified: Secondary | ICD-10-CM | POA: Insufficient documentation

## 2020-06-05 DIAGNOSIS — I120 Hypertensive chronic kidney disease with stage 5 chronic kidney disease or end stage renal disease: Secondary | ICD-10-CM | POA: Insufficient documentation

## 2020-06-05 DIAGNOSIS — N186 End stage renal disease: Secondary | ICD-10-CM | POA: Insufficient documentation

## 2020-06-05 DIAGNOSIS — E1122 Type 2 diabetes mellitus with diabetic chronic kidney disease: Secondary | ICD-10-CM | POA: Diagnosis not present

## 2020-06-05 DIAGNOSIS — C530 Malignant neoplasm of endocervix: Secondary | ICD-10-CM

## 2020-06-05 HISTORY — PX: IR IMAGING GUIDED PORT INSERTION: IMG5740

## 2020-06-05 LAB — GLUCOSE, CAPILLARY: Glucose-Capillary: 105 mg/dL — ABNORMAL HIGH (ref 70–99)

## 2020-06-05 LAB — POTASSIUM: Potassium: 2.6 mmol/L — CL (ref 3.5–5.1)

## 2020-06-05 MED ORDER — ONDANSETRON HCL 4 MG/2ML IJ SOLN
4.0000 mg | Freq: Once | INTRAMUSCULAR | Status: AC
  Administered 2020-06-05: 12:00:00 4 mg via INTRAVENOUS
  Filled 2020-06-05: qty 2

## 2020-06-05 MED ORDER — ONDANSETRON HCL 4 MG/2ML IJ SOLN
INTRAMUSCULAR | Status: AC
  Filled 2020-06-05: qty 2

## 2020-06-05 MED ORDER — POTASSIUM CHLORIDE CRYS ER 20 MEQ PO TBCR
EXTENDED_RELEASE_TABLET | ORAL | Status: AC
  Administered 2020-06-05: 14:00:00 40 meq via ORAL
  Filled 2020-06-05: qty 2

## 2020-06-05 MED ORDER — FENTANYL CITRATE (PF) 100 MCG/2ML IJ SOLN
INTRAMUSCULAR | Status: DC
Start: 2020-06-05 — End: 2020-06-06
  Filled 2020-06-05: qty 2

## 2020-06-05 MED ORDER — MIDAZOLAM HCL 2 MG/2ML IJ SOLN
INTRAMUSCULAR | Status: AC
  Filled 2020-06-05: qty 2

## 2020-06-05 MED ORDER — POTASSIUM CHLORIDE CRYS ER 20 MEQ PO TBCR
40.0000 meq | EXTENDED_RELEASE_TABLET | Freq: Once | ORAL | Status: AC
  Filled 2020-06-05: qty 2

## 2020-06-05 MED ORDER — CHLORHEXIDINE GLUCONATE CLOTH 2 % EX PADS
6.0000 | MEDICATED_PAD | Freq: Every day | CUTANEOUS | Status: DC
  Administered 2020-06-05: 6 via TOPICAL

## 2020-06-05 MED ORDER — CEFAZOLIN SODIUM-DEXTROSE 2-4 GM/100ML-% IV SOLN
2.0000 g | INTRAVENOUS | Status: AC
  Administered 2020-06-05: 2 g via INTRAVENOUS

## 2020-06-05 MED ORDER — CEFAZOLIN SODIUM-DEXTROSE 2-4 GM/100ML-% IV SOLN
INTRAVENOUS | Status: AC
  Filled 2020-06-05: qty 100

## 2020-06-05 MED ORDER — FENTANYL CITRATE (PF) 100 MCG/2ML IJ SOLN
INTRAMUSCULAR | Status: AC | PRN
  Administered 2020-06-05: 50 ug via INTRAVENOUS

## 2020-06-05 MED ORDER — MIDAZOLAM HCL 2 MG/2ML IJ SOLN
INTRAMUSCULAR | Status: AC | PRN
  Administered 2020-06-05: 1 mg via INTRAVENOUS

## 2020-06-05 MED ORDER — SODIUM CHLORIDE 0.9 % IV SOLN: INTRAVENOUS | Status: DC

## 2020-06-05 MED ORDER — LIDOCAINE-PRILOCAINE 2.5-2.5 % EX CREA: 1.0000 "application " | TOPICAL_CREAM | CUTANEOUS | 2 refills | Status: DC | PRN

## 2020-06-05 NOTE — H&P (Signed)
Chief Complaint: Patient was seen in consultation today for port placement  Referring Physician(s): Finnegan,Timothy J  Supervising Physician: Markus Daft  Patient Status: [facility] - Out-pt  History of Present Illness: Tracy Jennings is a 64 y.o. female with cervical cancer. She is currently receiving chemotherapy but having progressive trouble with PIV access. She is referred for port placement. PMHx, meds, labs, imaging, allergies reviewed. Hx of ESRD on PD. Also on Plavix for PAD, this has been stopped for 1 week. Feels well, no recent fevers, chills, illness. Has been NPO today as directed.    Past Medical History:  Diagnosis Date  . Anemia 04/2018   low iron. to be started on supplements  . Cervical cancer ([place])   . CKD (chronic kidney disease)    Stage IV  . Complication of anesthesia    receceived too much anesthesia, that she was in coma for a couple days   . COVID-19 virus detected 10/28/2019  . Diabetes mellitus without complication ([place])    type II  . ESRD (end stage renal disease) ([place])   . Heart murmur    followed as a child only  . HSIL (high grade squamous intraepithelial lesion) on Pap smear of cervix   . Hyperlipidemia associated with type 2 diabetes mellitus ([place])   . Hypertension   . Pancreatitis   . Peripheral vascular disease [facility])     Past Surgical History:  Procedure Laterality Date  . AMPUTATION TOE Left 2013   2nd toe. tip of toe (toe nail was infected)  . AV FISTULA PLACEMENT Left 05/11/2018   Procedure: ARTERIOVENOUS (AV) FISTULA CREATION;  Surgeon: Katha Cabal, MD;  Location: [facility] ORS;  Service: Vascular;  Laterality: Left;  . CATARACT EXTRACTION    . CERVICAL CONIZATION W/BX N/A 04/08/2020   Procedure: CONIZATION CERVIX WITH BIOPSY;  Surgeon: Mellody Drown, MD;  Location: [facility] ORS;  Service: Gynecology;  Laterality: N/A;  . CHOLECYSTECTOMY  2014  . COLONOSCOPY    . COLONOSCOPY WITH PROPOFOL N/A 01/10/2018   Procedure:  COLONOSCOPY WITH PROPOFOL;  Surgeon: Toledo, Benay Pike, MD;  Location: [facility] ENDOSCOPY;  Service: Gastroenterology;  Laterality: N/A;  . DIALYSIS/PERMA CATHETER INSERTION N/A 05/21/2018   Procedure: DIALYSIS/PERMA CATHETER INSERTION;  Surgeon: Algernon Huxley, MD;  Location: [place] CV LAB;  Service: Cardiovascular;  Laterality: N/A;  . DIALYSIS/PERMA CATHETER REMOVAL N/A 04/11/2019   Procedure: DIALYSIS/PERMA CATHETER REMOVAL;  Surgeon: Algernon Huxley, MD;  Location: [place] CV LAB;  Service: Cardiovascular;  Laterality: N/A;  . EYE SURGERY Bilateral 2018   cataract extractions  . THROMBECTOMY W/ EMBOLECTOMY  05/11/2018   Procedure: THROMBECTOMY ARTERIOVENOUS FISTULA;  Surgeon: Katha Cabal, MD;  Location: [facility] ORS;  Service: Vascular;;  . TUBAL LIGATION  1984    Allergies: Amlodipine, Hctz [hydrochlorothiazide], Heparin, Lmw heparin, Other, Sulfa antibiotics, Dairy aid [lactase], and Hydralazine  Medications: Prior to Admission medications   Medication Sig Start Date End Date Taking? Authorizing Provider  calcitRIOL (ROCALTROL) 0.25 MCG capsule Take 0.25 mcg by mouth daily. 09/26/19  Yes [provider]  calcium acetate (PHOSLO) 667 MG capsule Take 1,334 mg by mouth 2 (two) times daily with a meal.  08/20/19  Yes [provider]  carvedilol (COREG) 25 MG tablet Take 25 mg by mouth 2 (two) times daily with a meal.    Yes [provider]  clopidogrel (PLAVIX) 75 MG tablet Take 75 mg by mouth daily.    Yes [provider]  DILT-XR 180  MG 24 hr capsule Take 180 mg by mouth daily. 05/01/20  Yes [provider]  Potassium Chloride ER 20 MEQ TBCR Take 1 tablet by mouth daily.  05/12/20  Yes [provider]  prochlorperazine (COMPAZINE) 10 MG tablet Take 1 tablet (10 mg total) by mouth every 6 (six) hours as needed (Nausea or vomiting). 03/20/20  Yes Lloyd Huger, MD  diltiazem (DILACOR XR) 120 MG 24 hr capsule Take 120 mg by mouth  daily. Patient not taking: Reported on 05/21/2020    [provider]  lidocaine-prilocaine (EMLA) cream Apply 1 application topically as needed. Apply to port 1-2 hours prior to chemotherapy, cover with plastic wrap. 06/05/20   Lloyd Huger, MD  diltiazem (CARDIZEM CD) 120 MG 24 hr capsule Take 120 mg by mouth daily. 09/26/19 10/29/19  [provider]     Family History  Problem Relation Age of Onset  . Stroke Mother   . Hypertension Mother   . Gout Mother   . Cancer Father   . Diabetes Sister   . Diabetes Maternal Grandmother   . Diabetes Maternal Grandfather   . Diabetes Paternal Grandmother   . Diabetes Paternal Grandfather     Social History   Socioeconomic History  . Marital status: Married    Spouse name: clarence  . Number of children: 2  . Years of education: Not on file  . Highest education level: Not on file  Occupational History    Comment: works nights  Tobacco Use  . Smoking status: Never Smoker  . Smokeless tobacco: Never Used  Vaping Use  . Vaping Use: Never used  Substance and Sexual Activity  . Alcohol use: No  . Drug use: Never  . Sexual activity: Not Currently  Other Topics Concern  . Not on file  Social History Narrative  . Not on file   Social Determinants of Health   Financial Resource Strain:   . Difficulty of Paying Living Expenses: Not on file  Food Insecurity:   . Worried About Charity fundraiser in the Last Year: Not on file  . Ran Out of Food in the Last Year: Not on file  Transportation Needs:   . Lack of Transportation (Medical): Not on file  . Lack of Transportation (Non-Medical): Not on file  Physical Activity:   . Days of Exercise per Week: Not on file  . Minutes of Exercise per Session: Not on file  Stress:   . Feeling of Stress : Not on file  Social Connections:   . Frequency of Communication with Friends and Family: Not on file  . Frequency of Social Gatherings with Friends and Family: Not on file  .  Attends Religious Services: Not on file  . Active Member of Clubs or Organizations: Not on file  . Attends Archivist Meetings: Not on file  . Marital Status: Not on file    Review of Systems: A 12 point ROS discussed and pertinent positives are indicated in the HPI above.  All other systems are negative.  Review of Systems  Vital Signs: BP (!) 167/81   Pulse 97   Temp 98.1 F (36.7 C) (Oral)   Resp 18   Ht 5\' 3"  (1.6 m)   Wt 59 kg   SpO2 99%   BMI 23.04 kg/m   Physical Exam Constitutional:      Appearance: Normal appearance.  HENT:     Mouth/Throat:     Mouth: Mucous membranes are moist.  Cardiovascular:     Rate and Rhythm: Normal rate and regular rhythm.     Heart sounds: Normal heart sounds.  Pulmonary:     Effort: Pulmonary effort is normal. No respiratory distress.     Breath sounds: Normal breath sounds.  Abdominal:     General: Abdomen is flat.  Skin:    General: Skin is warm and dry.  Neurological:     General: No focal deficit present.     Mental Status: She is alert and oriented to person, place, and time.  Psychiatric:        Mood and Affect: Mood normal.        Thought Content: Thought content normal.        Judgment: Judgment normal.     Imaging: No results found.  Labs:  CBC: Recent Labs    03/11/20 1109 04/06/20 0851 05/21/20 0907 05/28/20 0845  WBC 5.5 5.7 4.0 4.7  HGB 12.9 10.5* 9.2* 8.1*  HCT 41.1 31.7* 27.3* 23.7*  PLT 304 264 235 187    COAGS: Recent Labs    11/03/19 1736  INR 1.1    BMP: Recent Labs    03/11/20 1109 03/11/20 1109 04/06/20 0851 04/08/20 0640 05/21/20 0907 05/28/20 0845  NA 134*  --  133*  --  132* 128*  K 3.2*   < > 2.9* 3.2* 2.4* 2.6*  CL 91*  --  91*  --  89* 88*  CO2 27  --  29  --  28 28  GLUCOSE 110*  --  113*  --  110* 121*  BUN 48*  --  36*  --  25* 29*  CALCIUM 8.6*  --  7.6*  --  7.8* 7.4*  CREATININE 10.57*  --  8.50*  --  9.33* 8.45*  GFRNONAA 3*  --  5*  --  4* 5*    GFRAA 4*  --  5*  --  5* 5*   < > = values in this interval not displayed.    LIVER FUNCTION TESTS: Recent Labs    10/29/19 0629 10/29/19 0629 11/03/19 1736 11/03/19 1736 11/07/19 0422 11/08/19 0334 03/11/20 1109 04/06/20 0851  BILITOT 0.9  --  0.8  --   --   --  0.6 0.6  AST 24  --  21  --   --   --  19 12*  ALT 13  --  17  --   --   --  16 11  ALKPHOS 31*  --  44  --   --   --  69 47  PROT 6.6  --  6.0*  --   --   --  8.4* 6.8  ALBUMIN 2.7*   < > 2.5*   < > 2.1* 2.1* 3.5 2.7*   < > = values in this interval not displayed.    TUMOR MARKERS: No results for input(s): AFPTM, CEA, CA199, CHROMGRNA in the last 8760 hours.  Assessment and Plan: Cervical cancer For port placement Labs reviewed. Risks and benefits of image guided port-a-catheter placement was discussed with the patient including, but not limited to bleeding, infection, pneumothorax, or fibrin sheath development and need for additional procedures.  All of the patient's questions were answered, patient is agreeable to proceed. Consent signed and in chart.    Thank you for this interesting consult.  I greatly enjoyed meeting EVELINA LORE and look forward to participating in their care.  A copy of this report was sent to  the requesting provider on this date.  Electronically Signed: Ascencion Dike, PA-C 06/05/2020, 11:36 AM   I spent a total of 20 minutes in face to face in clinical consultation, greater than 50% of which was counseling/coordinating care for port placement

## 2020-06-05 NOTE — Procedures (Signed)
Interventional Radiology Procedure:   Indications: Cervical cancer  Procedure: Port placement  Findings: Right jugular port, tip at SVC/RA junction  Complications: None     EBL: Minimal, less than 10 ml  Plan: Discharge in one hour.  Keep port site and incisions dry for at least 24 hours.     Fahad Cisse R. Ardene Remley, MD  Pager: [phone]    

## 2020-06-05 NOTE — Progress Notes (Signed)
Tracy Jennings  [phone] [phone]) [phone]  ID: Tracy Jennings OB: 18-Apr-1956  MR#: [id number]  NTZ#:[id number]  Patient Care Team: Sharyne Peach, MD as PCP - General (Family Medicine) Wellington Hampshire, MD as PCP - Cardiology (Cardiology) Noreene Filbert, MD as Radiation Oncologist (Radiation Oncology) Clent Jacks, RN as Oncology Nurse Navigator  CHIEF COMPLAINT: Cervical cancer  INTERVAL HISTORY: Patient returns to clinic today for further evaluation and consideration of cycle 3 of weekly cisplatin.  She missed last week's appointment secondary to diarrhea and increased weakness and fatigue.  She otherwise is tolerating her treatments well.  She is on home peritoneal dialysis for end-stage renal disease.  She has no neurologic complaints.  She denies any recent fevers or illnesses.  She has a good appetite and denies weight loss.  She has no chest pain, shortness of breath, cough, or hemoptysis.  She denies any nausea, vomiting, or constipation.  She does not make urine.  Patient offers no further specific complaints today.  REVIEW OF SYSTEMS:   Review of Systems  Constitutional: Positive for malaise/fatigue. Negative for fever and weight loss.  Respiratory: Negative.  Negative for cough, hemoptysis and shortness of breath.   Cardiovascular: Negative.  Negative for chest pain and leg swelling.  Gastrointestinal: Positive for diarrhea. Negative for abdominal pain.  Genitourinary: Negative.  Negative for dysuria.  Musculoskeletal: Negative.  Negative for back pain.  Skin: Negative.  Negative for rash.  Neurological: Positive for weakness. Negative for dizziness, focal weakness and headaches.  Psychiatric/Behavioral: Negative.  The patient is not nervous/anxious.     As per HPI. Otherwise, a complete review of systems is negative.  PAST MEDICAL HISTORY: Past Medical History:  Diagnosis Date  . Anemia 04/2018   low iron. to be started on  supplements  . Cervical cancer ([place])   . CKD (chronic kidney disease)    Stage IV  . Complication of anesthesia    receceived too much anesthesia, that she was in coma for a couple days   . COVID-19 virus detected 10/28/2019  . Diabetes mellitus without complication ([place])    type II  . ESRD (end stage renal disease) ([place])   . Heart murmur    followed as a child only  . HSIL (high grade squamous intraepithelial lesion) on Pap smear of cervix   . Hyperlipidemia associated with type 2 diabetes mellitus ([place])   . Hypertension   . Pancreatitis   . Peripheral vascular disease ([place])     PAST SURGICAL HISTORY: Past Surgical History:  Procedure Laterality Date  . AMPUTATION TOE Left 2013   2nd toe. tip of toe (toe nail was infected)  . AV FISTULA PLACEMENT Left 05/11/2018   Procedure: ARTERIOVENOUS (AV) FISTULA CREATION;  Surgeon: Katha Cabal, MD;  Location: [facility] OR[facility];  Service: Vascular;  Laterality: Left;  . CATARACT EXTRACTION    . CERVICAL CONIZATION W/BX N/A 04/08/2020   Procedure: CONIZATION CERVIX WITH BIOPSY;  Surgeon: Mellody Drown, MD;  Location: [facility];  Service: Gynecology;  Laterality: N/A;  . CHOLECYSTECTOMY  2014  . COLONOSCOPY    . COLONOSCOPY WITH PROPOFOL N/A 01/10/2018   Procedure: COLONOSCOPY WITH PROPOFOL;  Surgeon: Toledo, Benay Pike, MD;  Location: [facility];  Service: Gastroenterology;  Laterality: N/A;  . DIALYSIS/PERMA CATHETER INSERTION N/A 05/21/2018   Procedure: DIALYSIS/PERMA CATHETER INSERTION;  Surgeon: Algernon Huxley, MD;  Location: [place] [facility];  Service: Cardiovascular;  Laterality: N/A;  . DIALYSIS/PERMA CATHETER REMOVAL N/A 04/11/2019  Procedure: DIALYSIS/PERMA CATHETER REMOVAL;  Surgeon: Algernon Huxley, MD;  Location: [place] [facility];  Service: Cardiovascular;  Laterality: N/A;  . EYE SURGERY Bilateral 2018   cataract extractions  . IR IMAGING GUIDED PORT INSERTION  06/05/2020  . THROMBECTOMY W/ EMBOLECTOMY  05/11/2018   Procedure:  THROMBECTOMY ARTERIOVENOUS FISTULA;  Surgeon: Katha Cabal, MD;  Location: [facility];  Service: Vascular;;  . TUBAL LIGATION  1984    FAMILY HISTORY: Family History  Problem Relation Age of Onset  . Stroke Mother   . Hypertension Mother   . Gout Mother   . Cancer Father   . Diabetes Sister   . Diabetes Maternal Grandmother   . Diabetes Maternal Grandfather   . Diabetes Paternal Grandmother   . Diabetes Paternal Grandfather     ADVANCED DIRECTIVES (Y/N):  N  HEALTH MAINTENANCE: Social History   Tobacco Use  . Smoking status: Never Smoker  . Smokeless tobacco: Never Used  Vaping Use  . Vaping Use: Never used  Substance Use Topics  . Alcohol use: No  . Drug use: Never     Colonoscopy:  PAP:  Bone density:  Lipid panel:  Allergies  Allergen Reactions  . Amlodipine Swelling    Knees down to ankles Knees down to ankles  . Hctz [Hydrochlorothiazide]     pancreatitis  . Heparin Other (See Comments)    Pt reports cardiac arrest when given heparin  . Lmw Heparin     Reports cardiac arrest when given heparin  . Other Other (See Comments)    Reports cardiac arrest when given during surgery Reports cardiac arrest when given during surgery Reports cardiac arrest when given during surgery   . Sulfa Antibiotics Rash  . Dairy Aid [Lactase]     Runny nose  . Hydralazine Nausea Only    Current Outpatient Medications  Medication Sig Dispense Refill  . calcitRIOL (ROCALTROL) 0.25 MCG capsule Take 0.25 mcg by mouth daily.    . calcium acetate (PHOSLO) 667 MG capsule Take 1,334 mg by mouth 2 (two) times daily with a meal.     . carvedilol (COREG) 25 MG tablet Take 25 mg by mouth 2 (two) times daily with a meal.     . clopidogrel (PLAVIX) 75 MG tablet Take 75 mg by mouth daily.     Marland Kitchen DILT-XR 180 MG 24 hr capsule Take 180 mg by mouth daily.    Marland Kitchen diltiazem (DILACOR XR) 120 MG 24 hr capsule Take 120 mg by mouth daily.     Marland Kitchen lidocaine-prilocaine (EMLA) cream Apply 1  application topically as needed. Apply to port 1-2 hours prior to chemotherapy, cover with plastic wrap. 30 g 2  . loperamide (IMODIUM A-D) 2 MG tablet Take 2 mg by mouth 4 (four) times daily as needed for diarrhea or loose stools.    . Potassium Chloride ER 20 MEQ TBCR Take 1 tablet by mouth daily.     . prochlorperazine (COMPAZINE) 10 MG tablet Take 1 tablet (10 mg total) by mouth every 6 (six) hours as needed (Nausea or vomiting). 60 tablet 1  . diphenoxylate-atropine (LOMOTIL) 2.5-0.025 MG tablet Take 1 tablet by mouth 4 (four) times daily as needed for diarrhea or loose stools. 30 tablet 0   No current facility-administered medications for this visit.   Facility-Administered Medications Ordered in Other Visits  Medication Dose Route Frequency Provider Last Rate Last Admin  . 0.9 %  sodium chloride infusion   Intravenous Once Lloyd Huger, MD      .  calcium gluconate 2 g in sodium chloride 0.9 % 100 mL IVPB  2 g Intravenous Once Lloyd Huger, MD      . CISplatin (PLATINOL) 35 mg in sodium chloride 0.9 % 250 mL chemo infusion  20 mg/m2 (Treatment Plan Recorded) Intravenous Once Lloyd Huger, MD      . dexamethasone (DECADRON) 10 mg in sodium chloride 0.9 % 50 mL IVPB  10 mg Intravenous Once Lloyd Huger, MD      . dextrose 5 % and 0.45% NaCl 1,000 mL with potassium chloride 40 mEq, magnesium sulfate 12 mEq infusion   Intravenous Once Lloyd Huger, MD      . fosaprepitant (EMEND) 150 mg in sodium chloride 0.9 % 145 mL IVPB  150 mg Intravenous Once Lloyd Huger, MD      . heparin lock flush 100 unit/mL  500 Units Intracatheter Once PRN Lloyd Huger, MD      . palonosetron (ALOXI) injection 0.25 mg  0.25 mg Intravenous Once Lloyd Huger, MD        OBJECTIVE: Vitals:   06/08/20 0853  BP: 130/71  Pulse: 99  Resp: 18  Temp: (!) 97.2 F (36.2 C)  SpO2: 100%     Body mass index is 22.51 kg/m.    ECOG FS:1 - Symptomatic but completely  ambulatory  General: Well-developed, well-nourished, no acute distress. Eyes: Pink conjunctiva, anicteric sclera. HEENT: Normocephalic, moist mucous membranes. Lungs: No audible wheezing or coughing. Heart: Regular rate and rhythm. Abdomen: Soft, nontender, no obvious distention. Musculoskeletal: No edema, cyanosis, or clubbing. Neuro: Alert, answering all questions appropriately. Cranial nerves grossly intact. Skin: No rashes or petechiae noted. Psych: Normal affect.   LAB RESULTS:  Lab Results  Component Value Date   NA 130 (L) 06/08/2020   K 2.3 (LL) 06/08/2020   CL 91 (L) 06/08/2020   CO2 26 06/08/2020   GLUCOSE 97 06/08/2020   BUN 31 (H) 06/08/2020   CREATININE 11.21 (H) 06/08/2020   CALCIUM 6.5 (L) 06/08/2020   PROT 6.8 04/06/2020   ALBUMIN 2.7 (L) 04/06/2020   AST 12 (L) 04/06/2020   ALT 11 04/06/2020   ALKPHOS 47 04/06/2020   BILITOT 0.6 04/06/2020   GFRNONAA 3 (L) 06/08/2020   GFRAA 4 (L) 06/08/2020    Lab Results  Component Value Date   WBC 6.2 06/08/2020   NEUTROABS 5.5 06/08/2020   HGB 7.3 (L) 06/08/2020   HCT 21.2 (L) 06/08/2020   MCV 91.0 06/08/2020   PLT 161 06/08/2020     STUDIES: IR IMAGING GUIDED PORT INSERTION  Result Date: 06/05/2020 INDICATION: 64 year old with cervical cancer.  Port-A-Cath needed for therapy. EXAM: FLUOROSCOPIC AND ULTRASOUND GUIDED PLACEMENT OF A SUBCUTANEOUS PORT COMPARISON:  None. MEDICATIONS: Ancef 2 g; The antibiotic was administered within an appropriate time interval prior to skin puncture. ANESTHESIA/SEDATION: Versed 1.0 mg IV; Fentanyl 50 mcg IV; Moderate Sedation Time:  42 minutes The patient was continuously monitored during the procedure by the interventional radiology nurse under my direct supervision. FLUOROSCOPY TIME:  2 minutes, 6 seconds, 4 mGy COMPLICATIONS: None immediate. PROCEDURE: The procedure, risks, benefits, and alternatives were explained to the patient. Questions regarding the procedure were  encouraged and answered. The patient understands and consents to the procedure. Patient was placed supine on the interventional table. Ultrasound confirmed a patent right internal jugular vein. Ultrasound image was saved for documentation. The right chest and neck were cleaned with a skin antiseptic and a sterile drape was placed. Maximal  barrier sterile technique was utilized including caps, mask, sterile gowns, sterile gloves, sterile drape, hand hygiene and skin antiseptic. The right neck was anesthetized with 1% lidocaine. Small incision was made in the right neck with a blade. Micropuncture set was placed in the right internal jugular vein with ultrasound guidance. The micropuncture wire was used for measurement purposes. The right chest was anesthetized with 1% lidocaine. #15 blade was used to make an incision and a subcutaneous port pocket was formed. [place] was assembled. Subcutaneous tunnel was formed with a stiff tunneling device. The port catheter was brought through the subcutaneous tunnel. The port was placed in the subcutaneous pocket and sutured in place. The micropuncture set was exchanged for a peel-away sheath. The catheter was placed through the peel-away sheath and the tip was positioned at the superior cavoatrial junction. Catheter placement was confirmed with fluoroscopy. The port was accessed and flushed with saline (due to heparin allergy). The port pocket was closed using two layers of absorbable sutures and Dermabond. The vein skin site was closed using a single layer of absorbable suture and Dermabond. Sterile dressings were applied. Patient tolerated the procedure well without an immediate complication. Ultrasound and fluoroscopic images were taken and saved for this procedure. IMPRESSION: Placement of a subcutaneous port device. Catheter tip at the superior cavoatrial junction. Electronically Signed   By: Markus Daft M.D.   On: 06/05/2020 15:05    ASSESSMENT: Cervical  cancer.  PLAN:    1.  Cervical cancer: PET scan results from March 15, 2020 reviewed independently with low-level activity in the region of cervix corresponding to primary neoplasm.  Patient has some low activity in bilateral lymph nodes, but unclear if these are reactive or metastatic lesions.  She will benefit from concurrent radiation and chemotherapy.  Will dose reduce cisplatin 50% secondary to her end-stage renal disease.  Continue daily XRT.  Proceed with cycle 3 of weekly cisplatin today.  Return to clinic in 1 week for further evaluation and consideration of cycle 4.   2.  End-stage renal disease: Continue nightly peritoneal dialysis.  Patient reports that she is on the kidney transplant list.  This diagnosis or treatment should not change that status.  Case has been discussed with nephrology.  Laboratory results have been forwarded to nephrology to see any adjustments are needed in her dialysate. 3.  Hypokalemia: Chronic and unchanged.  Proceed with 40 mEq IV potassium today.  Continue oral potassium supplementation as prescribed.  Labs forwarded to nephrology as above. 4.  Anemia: Hemoglobin continues to trend down and is now 7.3.  Will plan for 1 unit of red blood cells next week.  Patient receives Procrit with her dialysis. 5.  Hypocalcemia: Patient received 2 g IV calcium today.  Adjust dialysate per nephrology as above. 6.  Diarrhea: Secondary to XRT.  Patient was given a prescription for Lomotil today.   Patient expressed understanding and was in agreement with this plan. She also understands that She can call clinic at any time with any questions, concerns, or complaints.   Cancer Staging No matching staging information was found for the patient.  Lloyd Huger, MD   06/08/2020 9:44 AM

## 2020-06-05 NOTE — Progress Notes (Signed)
Spoke with K. Bruning, PA-C re: K+ result of 2.7 and pt. C/o moderate nausea. Orders received. Pt. Med. With Zofran 4 mg IV now. TO give K+ p.o. after port placed for pt. Per PA-C orders.

## 2020-06-08 ENCOUNTER — Inpatient Hospital Stay: Payer: BC Managed Care – PPO

## 2020-06-08 ENCOUNTER — Ambulatory Visit
Admission: RE | Admit: 2020-06-08 | Discharge: 2020-06-08 | Disposition: A | Discharge status: Home / Self Care | Payer: BC Managed Care – PPO | Source: Ambulatory Visit | Attending: Radiation Oncology | Admitting: Radiation Oncology

## 2020-06-08 ENCOUNTER — Other Ambulatory Visit: Payer: Self-pay

## 2020-06-08 ENCOUNTER — Inpatient Hospital Stay (HOSPITAL_BASED_OUTPATIENT_CLINIC_OR_DEPARTMENT_OTHER): Payer: BC Managed Care – PPO | Admitting: Oncology

## 2020-06-08 ENCOUNTER — Encounter: Payer: Self-pay | Admitting: Oncology

## 2020-06-08 ENCOUNTER — Other Ambulatory Visit: Payer: Self-pay | Admitting: *Deleted

## 2020-06-08 VITALS — BP 130/71 | HR 99 | Temp 97.2°F | Resp 18 | Wt 127.1 lb

## 2020-06-08 DIAGNOSIS — C530 Malignant neoplasm of endocervix: Secondary | ICD-10-CM | POA: Diagnosis not present

## 2020-06-08 DIAGNOSIS — C539 Malignant neoplasm of cervix uteri, unspecified: Secondary | ICD-10-CM | POA: Diagnosis not present

## 2020-06-08 DIAGNOSIS — Z5111 Encounter for antineoplastic chemotherapy: Secondary | ICD-10-CM | POA: Diagnosis not present

## 2020-06-08 LAB — BASIC METABOLIC PANEL
Anion gap: 13 (ref 5–15)
BUN: 31 mg/dL — ABNORMAL HIGH (ref 8–23)
CO2: 26 mmol/L (ref 22–32)
Calcium: 6.5 mg/dL — ABNORMAL LOW (ref 8.9–10.3)
Chloride: 91 mmol/L — ABNORMAL LOW (ref 98–111)
Creatinine, Ser: 11.21 mg/dL — ABNORMAL HIGH (ref 0.44–1.00)
GFR calc Af Amer: 4 mL/min — ABNORMAL LOW (ref >60)
GFR calc non Af Amer: 3 mL/min — ABNORMAL LOW (ref >60)
Glucose, Bld: 97 mg/dL (ref 70–99)
Potassium: 2.3 mmol/L — CL (ref 3.5–5.1)
Sodium: 130 mmol/L — ABNORMAL LOW (ref 135–145)

## 2020-06-08 LAB — CBC WITH DIFFERENTIAL/PLATELET
Abs Immature Granulocytes: 0.03 10*3/uL (ref 0.00–0.07)
Basophils Absolute: 0 10*3/uL (ref 0.0–0.1)
Basophils Relative: 1 %
Eosinophils Absolute: 0.1 10*3/uL (ref 0.0–0.5)
Eosinophils Relative: 1 %
HCT: 21.2 % — ABNORMAL LOW (ref 36.0–46.0)
Hemoglobin: 7.3 g/dL — ABNORMAL LOW (ref 12.0–15.0)
Immature Granulocytes: 1 %
Lymphocytes Relative: 2 %
Lymphs Abs: 0.2 10*3/uL — ABNORMAL LOW (ref 0.7–4.0)
MCH: 31.3 pg (ref 26.0–34.0)
MCHC: 34.4 g/dL (ref 30.0–36.0)
MCV: 91 fL (ref 80.0–100.0)
Monocytes Absolute: 0.4 10*3/uL (ref 0.1–1.0)
Monocytes Relative: 7 %
Neutro Abs: 5.5 10*3/uL (ref 1.7–7.7)
Neutrophils Relative %: 88 %
Platelets: 161 10*3/uL (ref 150–400)
RBC: 2.33 MIL/uL — ABNORMAL LOW (ref 3.87–5.11)
RDW: 15.8 % — ABNORMAL HIGH (ref 11.5–15.5)
WBC: 6.2 10*3/uL (ref 4.0–10.5)
nRBC: 0 % (ref 0.0–0.2)

## 2020-06-08 MED ORDER — POTASSIUM CHLORIDE 2 MEQ/ML IV SOLN
Freq: Once | INTRAVENOUS | Status: AC
  Filled 2020-06-08: qty 1000

## 2020-06-08 MED ORDER — SODIUM CHLORIDE 0.9 % IV SOLN
Freq: Once | INTRAVENOUS | Status: AC
  Filled 2020-06-08: qty 250

## 2020-06-08 MED ORDER — SODIUM CHLORIDE 0.9 % IV SOLN
2.0000 g | Freq: Once | INTRAVENOUS | Status: AC
  Administered 2020-06-08: 2 g via INTRAVENOUS
  Filled 2020-06-08: qty 20

## 2020-06-08 MED ORDER — HEPARIN SOD (PORK) LOCK FLUSH 100 UNIT/ML IV SOLN
500.0000 [IU] | Freq: Once | INTRAVENOUS | Status: AC | PRN
  Administered 2020-06-08: 500 [IU]
  Filled 2020-06-08: qty 5

## 2020-06-08 MED ORDER — PALONOSETRON HCL INJECTION 0.25 MG/5ML
0.2500 mg | Freq: Once | INTRAVENOUS | Status: AC
  Administered 2020-06-08: 0.25 mg via INTRAVENOUS
  Filled 2020-06-08: qty 5

## 2020-06-08 MED ORDER — SODIUM CHLORIDE 0.9 % IV SOLN
150.0000 mg | Freq: Once | INTRAVENOUS | Status: AC
  Administered 2020-06-08: 150 mg via INTRAVENOUS
  Filled 2020-06-08: qty 150

## 2020-06-08 MED ORDER — SODIUM CHLORIDE 0.9% FLUSH
10.0000 mL | Freq: Once | INTRAVENOUS | Status: AC
  Administered 2020-06-08: 10 mL via INTRAVENOUS
  Filled 2020-06-08: qty 10

## 2020-06-08 MED ORDER — DIPHENOXYLATE-ATROPINE 2.5-0.025 MG PO TABS: 1.0000 | ORAL_TABLET | Freq: Four times a day (QID) | ORAL | 0 refills | Status: DC | PRN

## 2020-06-08 MED ORDER — SODIUM CHLORIDE 0.9 % IV SOLN
20.0000 mg/m2 | Freq: Once | INTRAVENOUS | Status: AC
  Administered 2020-06-08: 35 mg via INTRAVENOUS
  Filled 2020-06-08: qty 35

## 2020-06-08 MED ORDER — SODIUM CHLORIDE 0.9 % IV SOLN
10.0000 mg | Freq: Once | INTRAVENOUS | Status: AC
  Administered 2020-06-08: 10 mg via INTRAVENOUS
  Filled 2020-06-08: qty 10

## 2020-06-08 NOTE — Progress Notes (Signed)
Hemoglobin 7.3, Potassium 2.3, Calcium 6.5. Per Tillie Rung RN per Dr. Grayland Ormond proceed with treatment as scheduled. Pt to receive a total of 40 mEqs of potassium with Cisplatin hydration fluids and pt to receive 2 g IV Calcium ( according to [employer] and [facility] Else in pharmacy Calcium is compatible and can run with Cisplatin hydration fluids).  Per MD treatment parameters, no need to measure urine output, proceed with treatment regardless of urine output and creatinine (todays Creatinine 11.21). No additional IVFs at this time per [place] per Dr. Grayland Ormond.

## 2020-06-08 NOTE — Progress Notes (Signed)
Ok to proceed with treatment today per MD.

## 2020-06-08 NOTE — Progress Notes (Signed)
Patient reports no appetite today, extreme diarrhea, some vomitting, and dizziness off and on symptoms. States meds for diarrhea and nausea do not seem to be helping. Denies any pain today.

## 2020-06-09 ENCOUNTER — Ambulatory Visit
Admission: RE | Admit: 2020-06-09 | Discharge: 2020-06-09 | Disposition: A | Discharge status: Home / Self Care | Payer: BC Managed Care – PPO | Source: Ambulatory Visit | Attending: Radiation Oncology | Admitting: Radiation Oncology

## 2020-06-09 DIAGNOSIS — C539 Malignant neoplasm of cervix uteri, unspecified: Secondary | ICD-10-CM | POA: Diagnosis not present

## 2020-06-10 ENCOUNTER — Ambulatory Visit
Admission: RE | Admit: 2020-06-10 | Discharge: 2020-06-10 | Disposition: A | Discharge status: Home / Self Care | Payer: BC Managed Care – PPO | Source: Ambulatory Visit | Attending: Radiation Oncology | Admitting: Radiation Oncology

## 2020-06-10 DIAGNOSIS — C539 Malignant neoplasm of cervix uteri, unspecified: Secondary | ICD-10-CM | POA: Diagnosis not present

## 2020-06-11 ENCOUNTER — Telehealth: Payer: Self-pay

## 2020-06-11 ENCOUNTER — Ambulatory Visit: Payer: BC Managed Care – PPO | Admitting: Oncology

## 2020-06-11 ENCOUNTER — Other Ambulatory Visit: Payer: BC Managed Care – PPO

## 2020-06-11 ENCOUNTER — Ambulatory Visit
Admission: RE | Admit: 2020-06-11 | Discharge: 2020-06-11 | Disposition: A | Discharge status: Home / Self Care | Payer: BC Managed Care – PPO | Source: Ambulatory Visit | Attending: Radiation Oncology | Admitting: Radiation Oncology

## 2020-06-11 ENCOUNTER — Ambulatory Visit: Payer: BC Managed Care – PPO

## 2020-06-11 DIAGNOSIS — C539 Malignant neoplasm of cervix uteri, unspecified: Secondary | ICD-10-CM | POA: Diagnosis not present

## 2020-06-11 NOTE — Progress Notes (Signed)
[name]  [phone] [phone]  ID: Tracy Jennings OB: 1955-12-24  MR#: [id number]  ERD#:[id number]  Patient Care Team: Sharyne Peach, MD as PCP - General (Family Medicine) Wellington Hampshire, MD as PCP - Cardiology (Cardiology) Noreene Filbert, MD as Radiation Oncologist (Radiation Oncology) Clent Jacks, RN as Oncology Nurse Navigator Murlean Iba, MD (Nephrology)  CHIEF COMPLAINT: Cervical cancer  INTERVAL HISTORY: Patient returns to clinic today for further evaluation and consideration of cycle 4 of weekly cisplatin.  She has increasing weakness and fatigue as well as nausea.  She also admits to a poor appetite.  She is on home peritoneal dialysis for end-stage renal disease.  She has no neurologic complaints.  She denies any recent fevers or illnesses. She has no chest pain, shortness of breath, cough, or hemoptysis.  She denies any further diarrhea.  She has no vomiting or constipation.  She does not make urine.  Patient offers no further specific complaints today.    REVIEW OF SYSTEMS:   Review of Systems  Constitutional: Positive for malaise/fatigue. Negative for fever and weight loss.  Respiratory: Negative.  Negative for cough, hemoptysis and shortness of breath.   Cardiovascular: Negative.  Negative for chest pain and leg swelling.  Gastrointestinal: Positive for nausea. Negative for abdominal pain and diarrhea.  Genitourinary: Negative.  Negative for dysuria.  Musculoskeletal: Negative.  Negative for back pain.  Skin: Negative.  Negative for rash.  Neurological: Positive for weakness. Negative for dizziness, focal weakness and headaches.  Psychiatric/Behavioral: Negative.  The patient is not nervous/anxious.     As per HPI. Otherwise, a complete review of systems is negative.  PAST MEDICAL HISTORY: Past Medical History:  Diagnosis Date  . Anemia 04/2018   low iron. to be started on supplements  . Cervical cancer  ([place])   . CKD (chronic kidney disease)    Stage IV  . Complication of anesthesia    receceived too much anesthesia, that she was in coma for a couple days   . COVID-19 virus detected 10/28/2019  . Diabetes mellitus without complication ([place])    type II  . ESRD (end stage renal disease) ([place])   . Heart murmur    followed as a child only  . HSIL (high grade squamous intraepithelial lesion) on Pap smear of cervix   . Hyperlipidemia associated with type 2 diabetes mellitus ([place])   . Hypertension   . Pancreatitis   . Peripheral vascular disease ([place])     PAST SURGICAL HISTORY: Past Surgical History:  Procedure Laterality Date  . AMPUTATION TOE Left 2013   2nd toe. tip of toe (toe nail was infected)  . AV FISTULA PLACEMENT Left 05/11/2018   Procedure: ARTERIOVENOUS (AV) FISTULA CREATION;  Surgeon: Katha Cabal, MD;  Location: [facility] ORS;  Service: Vascular;  Laterality: Left;  . CATARACT EXTRACTION    . CERVICAL CONIZATION W/BX N/A 04/08/2020   Procedure: CONIZATION CERVIX WITH BIOPSY;  Surgeon: Mellody Drown, MD;  Location: [facility] ORS;  Service: Gynecology;  Laterality: N/A;  . CHOLECYSTECTOMY  2014  . COLONOSCOPY    . COLONOSCOPY WITH PROPOFOL N/A 01/10/2018   Procedure: COLONOSCOPY WITH PROPOFOL;  Surgeon: Toledo, Benay Pike, MD;  Location: [facility] ENDOSCOPY;  Service: Gastroenterology;  Laterality: N/A;  . DIALYSIS/PERMA CATHETER INSERTION N/A 05/21/2018   Procedure: DIALYSIS/PERMA CATHETER INSERTION;  Surgeon: Algernon Huxley, MD;  Location: [facility];  Service: Cardiovascular;  Laterality: N/A;  . DIALYSIS/PERMA CATHETER REMOVAL N/A 04/11/2019  Procedure: DIALYSIS/PERMA CATHETER REMOVAL;  Surgeon: Algernon Huxley, MD;  Location: [place] [facility];  Service: Cardiovascular;  Laterality: N/A;  . EYE SURGERY Bilateral 2018   cataract extractions  . IR IMAGING GUIDED PORT INSERTION  06/05/2020  . THROMBECTOMY W/ EMBOLECTOMY  05/11/2018   Procedure: THROMBECTOMY ARTERIOVENOUS  FISTULA;  Surgeon: Katha Cabal, MD;  Location: [facility] ORS;  Service: Vascular;;  . TUBAL LIGATION  1984    FAMILY HISTORY: Family History  Problem Relation Age of Onset  . Stroke Mother   . Hypertension Mother   . Gout Mother   . Cancer Father   . Diabetes Sister   . Diabetes Maternal Grandmother   . Diabetes Maternal Grandfather   . Diabetes Paternal Grandmother   . Diabetes Paternal Grandfather     ADVANCED DIRECTIVES (Y/N):  N  HEALTH MAINTENANCE: Social History   Tobacco Use  . Smoking status: Never Smoker  . Smokeless tobacco: Never Used  Vaping Use  . Vaping Use: Never used  Substance Use Topics  . Alcohol use: No  . Drug use: Never     Colonoscopy:  PAP:  Bone density:  Lipid panel:  Allergies  Allergen Reactions  . Amlodipine Swelling    Knees down to ankles Knees down to ankles  . Hctz [Hydrochlorothiazide]     pancreatitis  . Heparin Other (See Comments)    Pt reports cardiac arrest when given heparin  . Lmw Heparin     Reports cardiac arrest when given heparin  . Other Other (See Comments)    Reports cardiac arrest when given during surgery Reports cardiac arrest when given during surgery Reports cardiac arrest when given during surgery   . Sulfa Antibiotics Rash  . Dairy Aid [Lactase]     Runny nose  . Hydralazine Nausea Only    Current Outpatient Medications  Medication Sig Dispense Refill  . calcitRIOL (ROCALTROL) 0.25 MCG capsule Take 0.25 mcg by mouth daily.    . calcium acetate (PHOSLO) 667 MG capsule Take 1,334 mg by mouth 2 (two) times daily with a meal.     . carvedilol (COREG) 25 MG tablet Take 25 mg by mouth 2 (two) times daily with a meal.     . clopidogrel (PLAVIX) 75 MG tablet Take 75 mg by mouth daily.     Marland Kitchen DILT-XR 180 MG 24 hr capsule Take 180 mg by mouth daily.    Marland Kitchen diltiazem (DILACOR XR) 120 MG 24 hr capsule Take 120 mg by mouth daily.     . diphenoxylate-atropine (LOMOTIL) 2.5-0.025 MG tablet Take 1 tablet by  mouth 4 (four) times daily as needed for diarrhea or loose stools. 30 tablet 0  . lidocaine-prilocaine (EMLA) cream Apply 1 application topically as needed. Apply to port 1-2 hours prior to chemotherapy, cover with plastic wrap. 30 g 2  . loperamide (IMODIUM A-D) 2 MG tablet Take 2 mg by mouth 4 (four) times daily as needed for diarrhea or loose stools.    . Potassium Chloride ER 20 MEQ TBCR Take 1 tablet by mouth daily.     . prochlorperazine (COMPAZINE) 10 MG tablet Take 1 tablet (10 mg total) by mouth every 6 (six) hours as needed (Nausea or vomiting). 60 tablet 1   No current facility-administered medications for this visit.   Facility-Administered Medications Ordered in Other Visits  Medication Dose Route Frequency Provider Last Rate Last Admin  . 0.9 %  sodium chloride infusion (Manually program via Guardrails IV Fluids)  250  mL Intravenous Once Lloyd Huger, MD      . acetaminophen (TYLENOL) tablet 650 mg  650 mg Oral Once Lloyd Huger, MD      . calcium gluconate 2 g in sodium chloride 0.9 % 100 mL IVPB  2 g Intravenous Once Lloyd Huger, MD 60 mL/hr at 06/15/20 1033 2 g at 06/15/20 1033  . diphenhydrAMINE (BENADRYL) injection 25 mg  25 mg Intravenous Once Lloyd Huger, MD      . heparin lock flush 100 unit/mL  500 Units Intracatheter Daily PRN Lloyd Huger, MD        OBJECTIVE: Vitals:   06/15/20 0907  BP: (!) 156/68  Pulse: 92  Resp: 18  Temp: 97.8 F (36.6 C)  SpO2: 96%     Body mass index is 22.2 kg/m.    ECOG FS:1 - Symptomatic but completely ambulatory  General: Well-developed, well-nourished, no acute distress.  Sitting in a wheelchair. Eyes: Pink conjunctiva, anicteric sclera. HEENT: Normocephalic, moist mucous membranes. Lungs: No audible wheezing or coughing. Heart: Regular rate and rhythm. Abdomen: Soft, nontender, no obvious distention. Musculoskeletal: No edema, cyanosis, or clubbing. Neuro: Alert, answering all questions  appropriately. Cranial nerves grossly intact. Skin: No rashes or petechiae noted. Psych: Normal affect.    LAB RESULTS:  Lab Results  Component Value Date   NA 124 (L) 06/15/2020   K 2.3 (LL) 06/15/2020   CL 86 (L) 06/15/2020   CO2 24 06/15/2020   GLUCOSE 83 06/15/2020   BUN 49 (H) 06/15/2020   CREATININE 13.59 (H) 06/15/2020   CALCIUM 5.9 (LL) 06/15/2020   PROT 5.9 (L) 06/15/2020   ALBUMIN 2.1 (L) 06/15/2020   AST 11 (L) 06/15/2020   ALT 5 06/15/2020   ALKPHOS 53 06/15/2020   BILITOT 0.7 06/15/2020   GFRNONAA 3 (L) 06/15/2020   GFRAA 3 (L) 06/15/2020    Lab Results  Component Value Date   WBC 4.8 06/15/2020   NEUTROABS 4.2 06/15/2020   HGB 6.6 (L) 06/15/2020   HCT 18.9 (L) 06/15/2020   MCV 89.6 06/15/2020   PLT 108 (L) 06/15/2020     STUDIES: IR IMAGING GUIDED PORT INSERTION  Result Date: 06/05/2020 INDICATION: 64 year old with cervical cancer.  Port-A-Cath needed for therapy. EXAM: FLUOROSCOPIC AND ULTRASOUND GUIDED PLACEMENT OF A SUBCUTANEOUS PORT COMPARISON:  None. MEDICATIONS: Ancef 2 g; The antibiotic was administered within an appropriate time interval prior to skin puncture. ANESTHESIA/SEDATION: Versed 1.0 mg IV; Fentanyl 50 mcg IV; Moderate Sedation Time:  42 minutes The patient was continuously monitored during the procedure by the interventional radiology nurse under my direct supervision. FLUOROSCOPY TIME:  2 minutes, 6 seconds, 4 mGy COMPLICATIONS: None immediate. PROCEDURE: The procedure, risks, benefits, and alternatives were explained to the patient. Questions regarding the procedure were encouraged and answered. The patient understands and consents to the procedure. Patient was placed supine on the interventional table. Ultrasound confirmed a patent right internal jugular vein. Ultrasound image was saved for documentation. The right chest and neck were cleaned with a skin antiseptic and a sterile drape was placed. Maximal barrier sterile technique was  utilized including caps, mask, sterile gowns, sterile gloves, sterile drape, hand hygiene and skin antiseptic. The right neck was anesthetized with 1% lidocaine. Small incision was made in the right neck with a blade. Micropuncture set was placed in the right internal jugular vein with ultrasound guidance. The micropuncture wire was used for measurement purposes. The right chest was anesthetized with 1% lidocaine. #15 blade was  used to make an incision and a subcutaneous port pocket was formed. [place] was assembled. Subcutaneous tunnel was formed with a stiff tunneling device. The port catheter was brought through the subcutaneous tunnel. The port was placed in the subcutaneous pocket and sutured in place. The micropuncture set was exchanged for a peel-away sheath. The catheter was placed through the peel-away sheath and the tip was positioned at the superior cavoatrial junction. Catheter placement was confirmed with fluoroscopy. The port was accessed and flushed with saline (due to heparin allergy). The port pocket was closed using two layers of absorbable sutures and Dermabond. The vein skin site was closed using a single layer of absorbable suture and Dermabond. Sterile dressings were applied. Patient tolerated the procedure well without an immediate complication. Ultrasound and fluoroscopic images were taken and saved for this procedure. IMPRESSION: Placement of a subcutaneous port device. Catheter tip at the superior cavoatrial junction. Electronically Signed   By: Markus Daft M.D.   On: 06/05/2020 15:05    ASSESSMENT: Cervical cancer.  PLAN:    1.  Cervical cancer: PET scan results from March 15, 2020 reviewed independently with low-level activity in the region of cervix corresponding to primary neoplasm.  Patient has some low activity in bilateral lymph nodes, but unclear if these are reactive or metastatic lesions.  She will benefit from concurrent radiation and chemotherapy.  Will dose  reduce cisplatin 50% secondary to her end-stage renal disease.  Continue daily XRT.  Rather than treatment today, patient will receive 1 unit of packed red blood cells.  Return to clinic on Thursday for repeat laboratory work and consideration of cycle 4. 2.  End-stage renal disease: Continue nightly peritoneal dialysis.  Patient reports that she is on the kidney transplant list.  This diagnosis or treatment should not change that status.  Case has been discussed with nephrology.  Laboratory results have been forwarded to nephrology to see any adjustments are needed in her dialysate. 3.  Hypokalemia: Chronic and unchanged.  Continue oral potassium supplementation as prescribed.  Labs forwarded to nephrology as above. 4.  Anemia: Hemoglobin has trended down to 6.6.  Proceed with 1 unit packed red blood cells.  Patient receives Procrit with her dialysis. 5.  Hypocalcemia: Proceed with 2 g IV calcium today.  Adjust dialysate per nephrology as above. 6.  Diarrhea: Patient does not complain of this today.  Secondary to XRT.  Continue Lomotil as needed. 7.  Nausea: Continue current medications as prescribed.  Patient expressed understanding and was in agreement with this plan. She also understands that She can call clinic at any time with any questions, concerns, or complaints.   Cancer Staging No matching staging information was found for the patient.  Lloyd Huger, MD   06/15/2020 12:22 PM

## 2020-06-11 NOTE — Telephone Encounter (Signed)
Nutrition  Patient identified on Malnutrition Screening report for poor appetite and weight loss.  Chart reviewed.   Reached out to patient via phone but no answer or option to leave voicemail as mail box is full.    Karizma Cheek B. Zenia Resides, [place], [place] Registered Dietitian [phone] (mobile)

## 2020-06-12 ENCOUNTER — Ambulatory Visit
Admission: RE | Admit: 2020-06-12 | Discharge: 2020-06-12 | Disposition: A | Discharge status: Home / Self Care | Payer: BC Managed Care – PPO | Source: Ambulatory Visit | Attending: Radiation Oncology | Admitting: Radiation Oncology

## 2020-06-12 ENCOUNTER — Telehealth: Payer: Self-pay

## 2020-06-12 DIAGNOSIS — C539 Malignant neoplasm of cervix uteri, unspecified: Secondary | ICD-10-CM | POA: Diagnosis present

## 2020-06-12 NOTE — Telephone Encounter (Signed)
Tracy Jennings was scheduled to see Dr. Christel Mormon on 06/03/20. She did not show for this appointment. Met with her today in radiation. She stated she forgot about the appointment and has not rescheduled it. She is scheduled to finish radiation 06/18/20. Contacted Dr. Boneta Lucks office to get her rescheduled. They are going to speak with Dr. Christel Mormon and return my call. He is booked until 06/24/20. I told Tracy Jennings that we would contact her as soon as we hear back.

## 2020-06-15 ENCOUNTER — Inpatient Hospital Stay: Payer: BC Managed Care – PPO

## 2020-06-15 ENCOUNTER — Encounter: Payer: Self-pay | Admitting: Oncology

## 2020-06-15 ENCOUNTER — Inpatient Hospital Stay (HOSPITAL_BASED_OUTPATIENT_CLINIC_OR_DEPARTMENT_OTHER): Payer: BC Managed Care – PPO | Admitting: Oncology

## 2020-06-15 ENCOUNTER — Other Ambulatory Visit: Payer: Self-pay

## 2020-06-15 ENCOUNTER — Ambulatory Visit
Admission: RE | Admit: 2020-06-15 | Discharge: 2020-06-15 | Disposition: A | Discharge status: Home / Self Care | Payer: BC Managed Care – PPO | Source: Ambulatory Visit | Attending: Radiation Oncology | Admitting: Radiation Oncology

## 2020-06-15 ENCOUNTER — Telehealth: Payer: Self-pay

## 2020-06-15 ENCOUNTER — Inpatient Hospital Stay: Payer: BC Managed Care – PPO | Attending: Radiation Oncology

## 2020-06-15 VITALS — BP 156/68 | HR 92 | Temp 97.8°F | Resp 18 | Wt 125.3 lb

## 2020-06-15 VITALS — BP 183/79 | HR 90 | Temp 98.3°F | Resp 16

## 2020-06-15 DIAGNOSIS — R0602 Shortness of breath: Secondary | ICD-10-CM | POA: Diagnosis not present

## 2020-06-15 DIAGNOSIS — E785 Hyperlipidemia, unspecified: Secondary | ICD-10-CM | POA: Diagnosis not present

## 2020-06-15 DIAGNOSIS — R011 Cardiac murmur, unspecified: Secondary | ICD-10-CM | POA: Insufficient documentation

## 2020-06-15 DIAGNOSIS — D649 Anemia, unspecified: Secondary | ICD-10-CM | POA: Insufficient documentation

## 2020-06-15 DIAGNOSIS — R5383 Other fatigue: Secondary | ICD-10-CM | POA: Insufficient documentation

## 2020-06-15 DIAGNOSIS — C530 Malignant neoplasm of endocervix: Secondary | ICD-10-CM

## 2020-06-15 DIAGNOSIS — Z992 Dependence on renal dialysis: Secondary | ICD-10-CM | POA: Diagnosis not present

## 2020-06-15 DIAGNOSIS — Z8616 Personal history of COVID-19: Secondary | ICD-10-CM | POA: Diagnosis not present

## 2020-06-15 DIAGNOSIS — E1122 Type 2 diabetes mellitus with diabetic chronic kidney disease: Secondary | ICD-10-CM | POA: Insufficient documentation

## 2020-06-15 DIAGNOSIS — Z79899 Other long term (current) drug therapy: Secondary | ICD-10-CM | POA: Insufficient documentation

## 2020-06-15 DIAGNOSIS — N186 End stage renal disease: Secondary | ICD-10-CM | POA: Diagnosis not present

## 2020-06-15 DIAGNOSIS — Z95828 Presence of other vascular implants and grafts: Secondary | ICD-10-CM

## 2020-06-15 DIAGNOSIS — Z7901 Long term (current) use of anticoagulants: Secondary | ICD-10-CM | POA: Diagnosis not present

## 2020-06-15 DIAGNOSIS — R197 Diarrhea, unspecified: Secondary | ICD-10-CM | POA: Diagnosis not present

## 2020-06-15 DIAGNOSIS — E876 Hypokalemia: Secondary | ICD-10-CM | POA: Diagnosis not present

## 2020-06-15 DIAGNOSIS — R112 Nausea with vomiting, unspecified: Secondary | ICD-10-CM | POA: Diagnosis not present

## 2020-06-15 DIAGNOSIS — R109 Unspecified abdominal pain: Secondary | ICD-10-CM | POA: Insufficient documentation

## 2020-06-15 DIAGNOSIS — E1136 Type 2 diabetes mellitus with diabetic cataract: Secondary | ICD-10-CM | POA: Diagnosis not present

## 2020-06-15 DIAGNOSIS — R531 Weakness: Secondary | ICD-10-CM | POA: Insufficient documentation

## 2020-06-15 DIAGNOSIS — I120 Hypertensive chronic kidney disease with stage 5 chronic kidney disease or end stage renal disease: Secondary | ICD-10-CM | POA: Insufficient documentation

## 2020-06-15 DIAGNOSIS — R630 Anorexia: Secondary | ICD-10-CM | POA: Insufficient documentation

## 2020-06-15 DIAGNOSIS — R600 Localized edema: Secondary | ICD-10-CM | POA: Insufficient documentation

## 2020-06-15 DIAGNOSIS — R110 Nausea: Secondary | ICD-10-CM | POA: Diagnosis not present

## 2020-06-15 DIAGNOSIS — C539 Malignant neoplasm of cervix uteri, unspecified: Secondary | ICD-10-CM | POA: Diagnosis not present

## 2020-06-15 LAB — CBC WITH DIFFERENTIAL/PLATELET
Abs Immature Granulocytes: 0.04 10*3/uL (ref 0.00–0.07)
Basophils Absolute: 0 10*3/uL (ref 0.0–0.1)
Basophils Relative: 0 %
Eosinophils Absolute: 0 10*3/uL (ref 0.0–0.5)
Eosinophils Relative: 1 %
HCT: 18.9 % — ABNORMAL LOW (ref 36.0–46.0)
Hemoglobin: 6.6 g/dL — ABNORMAL LOW (ref 12.0–15.0)
Immature Granulocytes: 1 %
Lymphocytes Relative: 2 %
Lymphs Abs: 0.1 10*3/uL — ABNORMAL LOW (ref 0.7–4.0)
MCH: 31.3 pg (ref 26.0–34.0)
MCHC: 34.9 g/dL (ref 30.0–36.0)
MCV: 89.6 fL (ref 80.0–100.0)
Monocytes Absolute: 0.5 10*3/uL (ref 0.1–1.0)
Monocytes Relative: 10 %
Neutro Abs: 4.2 10*3/uL (ref 1.7–7.7)
Neutrophils Relative %: 86 %
Platelets: 108 10*3/uL — ABNORMAL LOW (ref 150–400)
RBC: 2.11 MIL/uL — ABNORMAL LOW (ref 3.87–5.11)
RDW: 15 % (ref 11.5–15.5)
WBC: 4.8 10*3/uL (ref 4.0–10.5)
nRBC: 0 % (ref 0.0–0.2)

## 2020-06-15 LAB — COMPREHENSIVE METABOLIC PANEL
ALT: 5 U/L (ref 0–44)
AST: 11 U/L — ABNORMAL LOW (ref 15–41)
Albumin: 2.1 g/dL — ABNORMAL LOW (ref 3.5–5.0)
Alkaline Phosphatase: 53 U/L (ref 38–126)
Anion gap: 14 (ref 5–15)
BUN: 49 mg/dL — ABNORMAL HIGH (ref 8–23)
CO2: 24 mmol/L (ref 22–32)
Calcium: 5.9 mg/dL — CL (ref 8.9–10.3)
Chloride: 86 mmol/L — ABNORMAL LOW (ref 98–111)
Creatinine, Ser: 13.59 mg/dL — ABNORMAL HIGH (ref 0.44–1.00)
GFR calc Af Amer: 3 mL/min — ABNORMAL LOW (ref >60)
GFR calc non Af Amer: 3 mL/min — ABNORMAL LOW (ref >60)
Glucose, Bld: 83 mg/dL (ref 70–99)
Potassium: 2.3 mmol/L — CL (ref 3.5–5.1)
Sodium: 124 mmol/L — ABNORMAL LOW (ref 135–145)
Total Bilirubin: 0.7 mg/dL (ref 0.3–1.2)
Total Protein: 5.9 g/dL — ABNORMAL LOW (ref 6.5–8.1)

## 2020-06-15 LAB — PREPARE RBC (CROSSMATCH)

## 2020-06-15 LAB — SAMPLE TO BLOOD BANK

## 2020-06-15 MED ORDER — SODIUM CHLORIDE 0.9% IV SOLUTION
250.0000 mL | Freq: Once | INTRAVENOUS | Status: AC
  Administered 2020-06-15: 250 mL via INTRAVENOUS
  Filled 2020-06-15: qty 250

## 2020-06-15 MED ORDER — ACETAMINOPHEN 325 MG PO TABS
650.0000 mg | ORAL_TABLET | Freq: Once | ORAL | Status: AC
  Administered 2020-06-15: 650 mg via ORAL
  Filled 2020-06-15: qty 2

## 2020-06-15 MED ORDER — HEPARIN SOD (PORK) LOCK FLUSH 100 UNIT/ML IV SOLN
500.0000 [IU] | Freq: Every day | INTRAVENOUS | Status: AC | PRN
  Administered 2020-06-15: 500 [IU]
  Filled 2020-06-15: qty 5

## 2020-06-15 MED ORDER — SODIUM CHLORIDE 0.9 % IV SOLN
2.0000 g | Freq: Once | INTRAVENOUS | Status: AC
  Administered 2020-06-15: 2 g via INTRAVENOUS
  Filled 2020-06-15: qty 20

## 2020-06-15 MED ORDER — DIPHENHYDRAMINE HCL 50 MG/ML IJ SOLN
25.0000 mg | Freq: Once | INTRAMUSCULAR | Status: AC
  Administered 2020-06-15: 25 mg via INTRAVENOUS
  Filled 2020-06-15: qty 1

## 2020-06-15 MED ORDER — SODIUM CHLORIDE 0.9% FLUSH
10.0000 mL | INTRAVENOUS | Status: AC | PRN
  Administered 2020-06-15: 10 mL
  Filled 2020-06-15: qty 10

## 2020-06-15 MED ORDER — HEPARIN SOD (PORK) LOCK FLUSH 100 UNIT/ML IV SOLN
INTRAVENOUS | Status: AC
  Filled 2020-06-15: qty 5

## 2020-06-15 MED ORDER — SODIUM CHLORIDE 0.9% FLUSH
10.0000 mL | Freq: Once | INTRAVENOUS | Status: AC
  Administered 2020-06-15: 10 mL via INTRAVENOUS
  Filled 2020-06-15: qty 10

## 2020-06-15 MED ORDER — SODIUM CHLORIDE 0.9 % IV SOLN
Freq: Once | INTRAVENOUS | Status: AC
  Filled 2020-06-15: qty 250

## 2020-06-15 NOTE — Progress Notes (Signed)
Per Argentina Donovan, RN, per Dr. Grayland Ormond, patient will not receive treatment today. She will get 1 unit PRBC and 2 gm Calcium. RN questioned Potassium level and Per Tillie Rung, RN patient will not need Potassium due to Potassium being baseline for patient with dialysis.

## 2020-06-15 NOTE — Progress Notes (Signed)
Patient reports today she is feeling very nauseous. She states she has not had an appetite and had some feelings of dizziness. She denies any pain today.

## 2020-06-15 NOTE — Telephone Encounter (Signed)
Dr. Boneta Lucks office has rescheduled appointment for 10/6 at 1200. I have provided this information to Ms. Koopman while she is in infusion today. Provided printed appointment and address. We have rescheduled her appointments here on 10/6 to accommodate her appointment at [facility]. Provided written details for these changes as well. All questions answered.

## 2020-06-16 ENCOUNTER — Ambulatory Visit
Admission: RE | Admit: 2020-06-16 | Discharge: 2020-06-16 | Disposition: A | Discharge status: Home / Self Care | Payer: BC Managed Care – PPO | Source: Ambulatory Visit | Attending: Radiation Oncology | Admitting: Radiation Oncology

## 2020-06-16 DIAGNOSIS — C539 Malignant neoplasm of cervix uteri, unspecified: Secondary | ICD-10-CM | POA: Diagnosis not present

## 2020-06-16 LAB — TYPE AND SCREEN
ABO/RH(D): A POS
Antibody Screen: NEGATIVE
Unit division: 0

## 2020-06-16 LAB — BPAM RBC
Blood Product Expiration Date: 202110302359
ISSUE DATE / TIME: 202110041325
Unit Type and Rh: 6200

## 2020-06-17 ENCOUNTER — Inpatient Hospital Stay: Payer: BC Managed Care – PPO

## 2020-06-17 ENCOUNTER — Ambulatory Visit: Payer: BC Managed Care – PPO | Admitting: Oncology

## 2020-06-17 ENCOUNTER — Encounter: Payer: Self-pay | Admitting: Obstetrics and Gynecology

## 2020-06-17 ENCOUNTER — Ambulatory Visit: Payer: BC Managed Care – PPO

## 2020-06-17 ENCOUNTER — Ambulatory Visit
Admission: RE | Admit: 2020-06-17 | Discharge: 2020-06-17 | Disposition: A | Discharge status: Home / Self Care | Payer: BC Managed Care – PPO | Source: Ambulatory Visit | Attending: Radiation Oncology | Admitting: Radiation Oncology

## 2020-06-17 ENCOUNTER — Other Ambulatory Visit: Payer: Self-pay

## 2020-06-17 ENCOUNTER — Inpatient Hospital Stay (HOSPITAL_BASED_OUTPATIENT_CLINIC_OR_DEPARTMENT_OTHER): Payer: BC Managed Care – PPO | Admitting: Obstetrics and Gynecology

## 2020-06-17 ENCOUNTER — Other Ambulatory Visit: Payer: BC Managed Care – PPO

## 2020-06-17 VITALS — BP 186/86 | HR 98

## 2020-06-17 DIAGNOSIS — R197 Diarrhea, unspecified: Secondary | ICD-10-CM

## 2020-06-17 DIAGNOSIS — C530 Malignant neoplasm of endocervix: Secondary | ICD-10-CM

## 2020-06-17 DIAGNOSIS — C539 Malignant neoplasm of cervix uteri, unspecified: Secondary | ICD-10-CM | POA: Diagnosis not present

## 2020-06-17 DIAGNOSIS — R110 Nausea: Secondary | ICD-10-CM

## 2020-06-17 MED ORDER — DIPHENOXYLATE-ATROPINE 2.5-0.025 MG PO TABS
1.0000 | ORAL_TABLET | Freq: Once | ORAL | Status: AC
  Administered 2020-06-17: 1 via ORAL
  Filled 2020-06-17: qty 1

## 2020-06-17 MED ORDER — PROCHLORPERAZINE MALEATE 10 MG PO TABS
10.0000 mg | ORAL_TABLET | Freq: Once | ORAL | Status: AC
  Administered 2020-06-17: 10 mg via ORAL
  Filled 2020-06-17: qty 1

## 2020-06-17 NOTE — Progress Notes (Signed)
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 She is seeing Dr. Baruch Gouty and Dr. Christel Mormon today as well.   Treatment has been complicated by electrolyte abnormalities, diarrhea, nausea and general malaise. No bleeding or leg swelling. Anemic with Hct 19% earlier this week and got blood transfusion.  Gynecology Oncology History PAP: Atypical squamous cells cannot exclude HSIL (ASC-H)  Seen by Dr Larey Days 03/10/20 On exam: Vagina normal, well-estrogenized vaginal mucosa, no evidence of vaginal neoplasia, Cervix: No overt carcinoma, well-visualized  Colposcopic exam: TZ not seen entirely, AWE not seen. No lesions seen.  ECC was done, random cervical biopsies were taken Pathology 10:00 - suspicious for adenocarcinoma, 6:00 bx and ECC - adenocarcinoma  She was initially seen on 03/11/2020 in the [facility] and cervix looked and felt normal.  PET/CT to evaluate origin of the cancer (uterine vs. cervical origin) and assess for extent of disease.  PET scan 03/13/20 shows some low level FDG activity in the cervix (none in uterus), but no prominent cervical mass.  Some PET activity in  pelvic nodes, but not above blood pool and no other primary or metastatic disease noted.  Initial plan was to proceed with chemoradiation for cervical cancer.  However, without any knowledge of the size or depth of invasion of the cancer in the cervix based on exam and imaging it was decided that a cone biopsy and D&C was needed to confirm that there is more than 3 mm of invasive cancer in the cervix and that there was no endometrial primary.  If cancer was confined to the cervix and invasion and < 82m invasion, this could be sufficiently treated with a large cone biopsy with negative margins, and it would be better to avoid the potential toxicity of chemoradiation in view of her existing vascular disease and dialysis.  If she is proven to have a cervical cancer with > 3 mm of invasion or LVSI would proceed with chemoradiation.   History significant for significant vascular disease with end stage renal disease, on peritoneal dialysis and carotid stenosis. She is on [facility] list [facility] On Plavix/asa for thrombosis prophylaxis. She had a thrombosis of her AVF fistula in 2019 after this was placed and had cardiac arrest post thrombectomy.  She was seen by cardiology on 03/30/2020 and felt to be doing well from cardiac perspective and given clearance for surgery. Echo in 10/2019 revealed EF 60-65%. EKG 03/30/20 demonstrated NSR, 77 bpm, rare PVC, lVH, poor R wave progression along precordial leads, no acute st/t changes. Blood pressure is therapeutically elevated due to history of orthostatic hypotension. She is felt to be low risk for noncardiac surgery. Vascular surgery recommended holding clopidogrel for 5 days prior to surgery and for 2 days post  operatively. She continues peritoneal dialysis.   Cone biopsy 04/08/20  A. CERVIX, CONIZATION BIOPSY:  - INVASIVE ADENOCARCINOMA WITH INVOLVEMENT OF ENDOCERVICAL AND DEEP MARGINS.   B. ENDOCERVIX, CURETTAGE:  - SCANT DETACHED AND DEGENERATED CELLS, SUSPICIOUS FOR  MALIGNANCY.   C. ENDOMETRIUM, CURETTAGE: - INVASIVE ADENOCARCINOMA.   Comment:  Sections demonstrate an infiltrative adenocarcinoma with areas of marked nuclear pleomorphism. This high-grade adenocarcinoma shows diffuse  positive staining for p16. Staining for p53 appears to demonstrate a null (mutated) pattern, which would rule out an HPV-associated endocervical adenocarcinoma and would favor the diagnosis of high-grade serous carcinoma. However, there is not an internal positive control, and therefore the stain cannot be interpreted as null with complete certainty. MMR stains were performed to possibly aid in diagnosing an endometrioid carcinoma, with no loss of MMR proteins.  The case has been sent for expert consultation and second opinion, with an addendum to follow.   ([facility] review below) A. 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 The ectocervical margin appears uninvolved. No lymphovascular invasion is identified. Chromogenic in situ hybridization for high risk HPV E6/7 mRNA (types 16, 18, 26, 31, 33, 35, 39, 45, 51, 52, 53, 56, 58, 59, 66, 68, 73 and 82) is negative in the cells of interest.  Immunohistochemistry was performed on blocks A2 and A5 at [facility] to characterize the pathologic process and demonstrates the following immunophenotype in the cells of interest: POSITIVE: HNF1B, AMACR, Napsin A, p16 (diffuse), Ki-67 elevated, NEGATIVE: ER, PR. This staining profile supports the above diagnosis.  B. Endocervix, curettage: Scant detached atypical cells with degenerative changes, suspicious for adenocarcinoma.  C. Endometrium, curettage: Clear cell adenocarcinoma, present in a minute fragment of stroma, see COMMENT. COMMENT: The sections demonstrate an infiltrative glandular proliferation of malignant cells with marked nuclear pleomorphism, undermining endocervical and ectocervical epithelium at the  squamocolumnar junction. The lesion measures at least 8 mm in lateral extent and invades 1 mm of cervical stroma, but extension to the endocervical and deep margins precludes definitive measurement. Although the bulk of the lesion appears in the cone biopsy, an endometrial primary cannot be excluded.  In view of pathology report and PET scan believe this is most likely a primary cervical clear cell adenocarcinoma and she started chemoradiation 05/11/20. Exam at that time showed Necrotic debris over cone bed and this was removed with swab.  Granulation tissue underneath.  Medical history Menopause age 15.  No bleeding since.  No pelvic pain.   UTI a month ago and some vaginal discharge.   On peritoneal dialysis for about a year and stopped working. Takes iron shots for anemia.  Patient hospitalized three days for mild COVID19 pneumonia in February 2021 2/21 Carotid US 1. Bilateral carotid bifurcation plaque resulting in less than 50% diameter ICA stenosis. 2. Antegrade bilateral vertebral arterial flow. Cardiac Echo EF 60-65%  HIV 10/29/19- nonreactive  Oncology History  Cervical cancer ([place])  03/11/2020 Initial Diagnosis   Cervical cancer ([place])   05/21/2020 -  Chemotherapy   The patient had palonosetron (ALOXI) injection 0.25 mg, 0.25 mg, Intravenous,  Once, 3 of 7 cycles Administration: 0.25 mg (05/21/2020), 0.25 mg (05/28/2020), 0.25 mg (06/08/2020) CISplatin (PLATINOL) 35 mg in sodium chloride 0.9 % 250 mL chemo infusion, 20 mg/m2 = 35 mg (100 % of original dose 20 mg/m2), Intravenous,  Once, 3 of 7 cycles Dose modification: 20 mg/m2 (original dose 20 mg/m2, Cycle 1, Reason: Other (see comments)) Administration: 35 mg (05/21/2020),  35 mg (05/28/2020), 35 mg (06/08/2020) fosaprepitant (EMEND) 150 mg in sodium chloride 0.9 % 145 mL IVPB, 150 mg, Intravenous,  Once, 3 of 7 cycles Administration: 150 mg (05/21/2020), 150 mg (05/28/2020), 150 mg (06/08/2020)  for chemotherapy treatment.     Patient  Active Problem List   Diagnosis Date Noted  . 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[place]) 03/31/2020  . Goals of care, counseling/discussion 03/20/2020  . Cervical cancer ([place]) 03/11/2020  . Orthostatic hypotension   . Syncope 11/04/2019  . 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 Type 2 diabetes mellitus with other specified complication ([place]) 16/06/9603  . Peritoneal dialysis status ([place]) 11/03/2019  . Unable to care for self 11/03/2019  . Supine hypertension 11/03/2019  . Metabolic acidosis, increased anion gap (IAG)   . COVID-19   . Generalized weakness   . Recurrent syncope 10/28/2019  . 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 Complication of vascular access for dialysis 05/11/2018  . Respiratory arrest ([place]) 05/11/2018  . Hyperlipidemia 04/12/2018  . Pancreatitis, recurrent 11/01/2017  . HTN (hypertension) 11/01/2017  . ESRD (end stage renal disease) ([place]) 11/01/2017  . Recurrent pancreatitis 11/01/2017   Past Medical History:  Diagnosis Date  . Anemia 04/2018   low iron. to be started on supplements  . Cervical cancer ([place])   . CKD (chronic kidney disease)    Stage IV  . Complication of anesthesia    receceived too much anesthesia, that she was in coma for a couple days   . COVID-19 virus detected 10/28/2019  . Diabetes mellitus without complication ([place])    type II  . ESRD (end stage renal disease) ([place])   . Heart murmur    followed as a child only  . HSIL (high grade squamous intraepithelial lesion) on Pap smear of cervix   . Hyperlipidemia associated with type 2 diabetes mellitus ([place])   . Hypertension   . Pancreatitis   . Peripheral vascular disease [facility])    Past Surgical History:  Procedure Laterality Date  . AMPUTATION TOE Left 2013   2nd toe. tip of toe (toe nail was infected)  . AV FISTULA PLACEMENT Left 05/11/2018   Procedure: ARTERIOVENOUS (AV) FISTULA CREATION;  Surgeon: Katha Cabal, MD;  Location: [facility] ORS;   Service: Vascular;  Laterality: Left;  . CATARACT EXTRACTION    . CERVICAL CONIZATION W/BX N/A 04/08/2020   Procedure: CONIZATION CERVIX WITH BIOPSY;  Surgeon: Mellody Drown, MD;  Location: [facility] ORS;  Service: Gynecology;  Laterality: N/A;  . CHOLECYSTECTOMY  2014  . COLONOSCOPY    . COLONOSCOPY WITH PROPOFOL N/A 01/10/2018   Procedure: COLONOSCOPY WITH PROPOFOL;  Surgeon: Toledo, Benay Pike, MD;  Location: [facility] ENDOSCOPY;  Service: Gastroenterology;  Laterality: N/A;  . DIALYSIS/PERMA CATHETER INSERTION N/A 05/21/2018   Procedure: DIALYSIS/PERMA CATHETER INSERTION;  Surgeon: Algernon Huxley, MD;  Location: [place] CV LAB;  Service: Cardiovascular;  Laterality: N/A;  . DIALYSIS/PERMA CATHETER REMOVAL N/A 04/11/2019   Procedure: DIALYSIS/PERMA CATHETER REMOVAL;  Surgeon: Algernon Huxley, MD;  Location: [place] [facility] LAB;  Service: Cardiovascular;  Laterality: N/A;  . EYE SURGERY Bilateral 2018   cataract extractions  . IR IMAGING GUIDED PORT INSERTION  06/05/2020  . THROMBECTOMY W/ EMBOLECTOMY  05/11/2018   Procedure: THROMBECTOMY ARTERIOVENOUS FISTULA;  Surgeon: Katha Cabal, MD;  Location: [facility] ORS;  Service: Vascular;;  . TUBAL LIGATION  1984   Family History  Problem Relation Age of Onset  . Stroke Mother   . Hypertension Mother   .  Gout Mother   . Cancer Father   . Diabetes Sister   . Diabetes Maternal Grandmother   . Diabetes Maternal Grandfather   . Diabetes Paternal Grandmother   . Diabetes Paternal Grandfather    Social History   Socioeconomic History  . Marital status: Married    Spouse name: clarence  . Number of children: 2  . Years of education: Not on file  . Highest education level: Not on file  Occupational History    Comment: works nights  Tobacco Use  . Smoking status: Never Smoker  . Smokeless tobacco: Never Used  Vaping Use  . Vaping Use: Never used  Substance and Sexual Activity  . Alcohol use: No  . Drug use: Never  . Sexual activity: Not  Currently  Other Topics Concern  . Not on file  Social History Narrative  . Not on file   Social Determinants of Health   Financial Resource Strain:   . Difficulty of Paying Living Expenses: Not on file  Food Insecurity:   . Worried About Charity fundraiser in the Last Year: Not on file  . Ran Out of Food in the Last Year: Not on file  Transportation Needs:   . Lack of Transportation (Medical): Not on file  . Lack of Transportation (Non-Medical): Not on file  Physical Activity:   . Days of Exercise per Week: Not on file  . Minutes of Exercise per Session: Not on file  Stress:   . Feeling of Stress : Not on file  Social Connections:   . Frequency of Communication with Friends and Family: Not on file  . Frequency of Social Gatherings with Friends and Family: Not on file  . Attends Religious Services: Not on file  . Active Member of Clubs or Organizations: Not on file  . Attends Archivist Meetings: Not on file  . Marital Status: Not on file   Allergies  Allergen Reactions  . Amlodipine Swelling    Knees down to ankles Knees down to ankles  . Hctz [Hydrochlorothiazide]     pancreatitis  . Heparin Other (See Comments)    Pt reports cardiac arrest when given heparin  . Lmw Heparin     Reports cardiac arrest when given heparin  . Other Other (See Comments)    Reports cardiac arrest when given during surgery Reports cardiac arrest when given during surgery Reports cardiac arrest when given during surgery   . Sulfa Antibiotics Rash  . Dairy Aid [Lactase]     Runny nose  . Hydralazine Nausea Only   Current Outpatient Medications on File Prior to Visit  Medication Sig Dispense Refill  . calcitRIOL (ROCALTROL) 0.25 MCG capsule Take 0.25 mcg by mouth daily.    . calcium acetate (PHOSLO) 667 MG capsule Take 1,334 mg by mouth 2 (two) times daily with a meal.     . carvedilol (COREG) 25 MG tablet Take 25 mg by mouth 2 (two) times daily with a meal.     . clopidogrel  (PLAVIX) 75 MG tablet Take 75 mg by mouth daily.     Marland Kitchen DILT-XR 180 MG 24 hr capsule Take 180 mg by mouth daily.    Marland Kitchen diltiazem (DILACOR XR) 120 MG 24 hr capsule Take 120 mg by mouth daily.     . diphenoxylate-atropine (LOMOTIL) 2.5-0.025 MG tablet Take 1 tablet by mouth 4 (four) times daily as needed for diarrhea or loose stools. 30 tablet 0  . lidocaine-prilocaine (EMLA) cream Apply 1  application topically as needed. Apply to port 1-2 hours prior to chemotherapy, cover with plastic wrap. 30 g 2  . loperamide (IMODIUM A-D) 2 MG tablet Take 2 mg by mouth 4 (four) times daily as needed for diarrhea or loose stools.    . Potassium Chloride ER 20 MEQ TBCR Take 1 tablet by mouth daily.     . prochlorperazine (COMPAZINE) 10 MG tablet Take 1 tablet (10 mg total) by mouth every 6 (six) hours as needed (Nausea or vomiting). 60 tablet 1  . [DISCONTINUED] diltiazem (CARDIZEM CD) 120 MG 24 hr capsule Take 120 mg by mouth daily.     No current facility-administered medications on file prior to visit.   Review of Systems General:  fatigue Skin: no complaints Eyes: no complaints HEENT: no complaints Breasts: no complaints Pulmonary: shortness of breath Cardiac: leg swelling Gastrointestinal: nausea, dry heaves, diarrhea Genitourinary/Sexual: no complaints Ob/Gyn: no complaints Musculoskeletal: no complaints Hematology: no complaints Neurologic/Psych: no complaints  Objective:  Physical Examination:  Today's Vitals   06/17/20 0924  BP: (!) 186/86  Pulse: 98   There is no height or weight on file to calculate BMI.  ECOG Performance Status: 2 - Symptomatic, <50% confined to bed  GENERAL: dry heaving. Ill appearing. In wheelchair. Thin built.  HEENT:  PERRL, neck supple with midline trachea. Thyroid without masses.  NODES:  No cervical, supraclavicular, axillary, or inguinal lymphadenopathy palpated.  LUNGS:  Clear to auscultation bilaterally.  No wheezes or rhonchi. HEART:  Regular rate and  rhythm. No murmur appreciated. ABDOMEN:  Soft, nontender.  Positive, normoactive bowel sounds.  MSK:  No focal spinal tenderness to palpation. Full range of motion bilaterally in the upper extremities. EXTREMITIES: 1+ bilateral peripheral edema  SKIN:  Clear with no obvious rashes or skin changes. No nail dyscrasia. NEURO:  Nonfocal. Well oriented.    Pelvic exam: exam chaperoned by nursing: EGBUS: normal.  Vagina normal: Cervix: no obvious tumor, soft and a bit irregular. Bimanual: normal uterus. No masses. Parametria normal.     Lab Review CMP Latest Ref Rng & Units 06/15/2020 06/08/2020 06/05/2020  Glucose 70 - 99 mg/dL 83 97 -  BUN 8 - 23 mg/dL 49(H) 31(H) -  Creatinine 0.44 - 1.00 mg/dL 13.59(H) 11.21(H) -  Sodium 135 - 145 mmol/L 124(L) 130(L) -  Potassium 3.5 - 5.1 mmol/L 2.3(LL) 2.3(LL) 2.6(LL)  Chloride 98 - 111 mmol/L 86(L) 91(L) -  CO2 22 - 32 mmol/L 24 26 -  Calcium 8.9 - 10.3 mg/dL 5.9(LL) 6.5(L) -  Total Protein 6.5 - 8.1 g/dL 5.9(L) - -  Total Bilirubin 0.3 - 1.2 mg/dL 0.7 - -  Alkaline Phos 38 - 126 U/L 53 - -  AST 15 - 41 U/L 11(L) - -  ALT 0 - 44 U/L 5 - -   Lab Results  Component Value Date   WBC 4.8 06/15/2020   HGB 6.6 (L) 06/15/2020   HCT 18.9 (L) 06/15/2020   MCV 89.6 06/15/2020   PLT 108 (L) 06/15/2020      Assessment:  TERRENCE PIZANA is a 64 y.o. female diagnosed with clear cell adenocarcinoma involving the cervix, unclear if cervical or uterine primary, but strongly favor cervical as this was detected by PAP/cervical biopsy with no bleeding and the bulk of the cancer is in the cervix based on PET scan and cone/D&C.  Clinically she has stage I disease, but difficult to assign substage.  The PET/CT did not really show a tumor mass in the cervix  despite some FDG positivity there.  It could also be a primary endometrial cancer, and this cannot be ruled out completely.  No evidence of metastatic disease on PET.   She is now s/p 3 cycles of cisplatin and  concurrent radiation. Treatment complicated by anemia requiring transfusion, nausea, diarrhea, and electrolyte abnormalities. Got blood transfusion earlier this week  Exam of cervix today much improved.   ESRD on peritoneal dialysis for a year and being considered for transplant. Renal failure associated anemia getting IV iron.  Medical co-morbidities complicating care: AODM (not on meds now, but BS high 6/12), HTN (systolic high today), ESRD on dialysis, carotid stenosis on Plavix and ASA  Plan:   Problem List Items Addressed This Visit      Genitourinary   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[place]) - Primary     Gave antiemetic/compazine in clinic along with lomotil for nausea and diarrhea. Goal to control nausea so that she is able to take her BP medication.   She also saw Dr Christel Mormon today and we agree that he will proceed with brachytherapy to complete treatment of assumed cervical primary cancer.  PET/CT can be repeated afterwards.  Based on results may want to consider hysterectomy if still concerned about possible uterine primary or may need systemic therapy if evidence of metastatic disease.  That said, her performance status is marginal due to CRF on peritoneal dialysis.   The patient's diagnosis, an outline of the further diagnostic and laboratory studies which will be required, the recommendation, and alternatives were discussed.  All questions were answered to the patient's satisfaction.   Verlon Au, NP  I personally interviewed and examined the patient. Agreed with the above/below plan of care. I have directly contributed to assessment and plan of care of this patient and educated and discussed with patient and family.  Mellody Drown, MD

## 2020-06-18 ENCOUNTER — Encounter: Payer: Self-pay | Admitting: Oncology

## 2020-06-18 ENCOUNTER — Inpatient Hospital Stay: Payer: BC Managed Care – PPO

## 2020-06-18 ENCOUNTER — Ambulatory Visit
Admission: RE | Admit: 2020-06-18 | Discharge: 2020-06-18 | Disposition: A | Discharge status: Home / Self Care | Payer: BC Managed Care – PPO | Source: Ambulatory Visit | Attending: Radiation Oncology | Admitting: Radiation Oncology

## 2020-06-18 ENCOUNTER — Inpatient Hospital Stay (HOSPITAL_BASED_OUTPATIENT_CLINIC_OR_DEPARTMENT_OTHER): Payer: BC Managed Care – PPO | Admitting: Oncology

## 2020-06-18 VITALS — BP 196/94 | HR 98 | Temp 97.6°F | Resp 20 | Wt 124.3 lb

## 2020-06-18 DIAGNOSIS — K652 Spontaneous bacterial peritonitis: Secondary | ICD-10-CM | POA: Diagnosis not present

## 2020-06-18 DIAGNOSIS — Z95828 Presence of other vascular implants and grafts: Secondary | ICD-10-CM

## 2020-06-18 DIAGNOSIS — C530 Malignant neoplasm of endocervix: Secondary | ICD-10-CM

## 2020-06-18 DIAGNOSIS — C539 Malignant neoplasm of cervix uteri, unspecified: Secondary | ICD-10-CM | POA: Diagnosis not present

## 2020-06-18 DIAGNOSIS — R110 Nausea: Secondary | ICD-10-CM

## 2020-06-18 LAB — CBC WITH DIFFERENTIAL/PLATELET
Abs Immature Granulocytes: 0.04 10*3/uL (ref 0.00–0.07)
Basophils Absolute: 0 10*3/uL (ref 0.0–0.1)
Basophils Relative: 1 %
Eosinophils Absolute: 0 10*3/uL (ref 0.0–0.5)
Eosinophils Relative: 1 %
HCT: 23.8 % — ABNORMAL LOW (ref 36.0–46.0)
Hemoglobin: 8.6 g/dL — ABNORMAL LOW (ref 12.0–15.0)
Immature Granulocytes: 1 %
Lymphocytes Relative: 1 %
Lymphs Abs: 0.1 10*3/uL — ABNORMAL LOW (ref 0.7–4.0)
MCH: 31.9 pg (ref 26.0–34.0)
MCHC: 36.1 g/dL — ABNORMAL HIGH (ref 30.0–36.0)
MCV: 88.1 fL (ref 80.0–100.0)
Monocytes Absolute: 0.4 10*3/uL (ref 0.1–1.0)
Monocytes Relative: 6 %
Neutro Abs: 5.5 10*3/uL (ref 1.7–7.7)
Neutrophils Relative %: 90 %
Platelets: 131 10*3/uL — ABNORMAL LOW (ref 150–400)
RBC: 2.7 MIL/uL — ABNORMAL LOW (ref 3.87–5.11)
RDW: 14.5 % (ref 11.5–15.5)
WBC: 6 10*3/uL (ref 4.0–10.5)
nRBC: 0 % (ref 0.0–0.2)

## 2020-06-18 LAB — COMPREHENSIVE METABOLIC PANEL
ALT: <5 U/L (ref 0–44)
AST: 10 U/L — ABNORMAL LOW (ref 15–41)
Albumin: 2.3 g/dL — ABNORMAL LOW (ref 3.5–5.0)
Alkaline Phosphatase: 51 U/L (ref 38–126)
Anion gap: 18 — ABNORMAL HIGH (ref 5–15)
BUN: 67 mg/dL — ABNORMAL HIGH (ref 8–23)
CO2: 21 mmol/L — ABNORMAL LOW (ref 22–32)
Calcium: 6.1 mg/dL — CL (ref 8.9–10.3)
Chloride: 86 mmol/L — ABNORMAL LOW (ref 98–111)
Creatinine, Ser: 16.79 mg/dL — ABNORMAL HIGH (ref 0.44–1.00)
GFR calc non Af Amer: 2 mL/min — ABNORMAL LOW (ref >60)
Glucose, Bld: 69 mg/dL — ABNORMAL LOW (ref 70–99)
Potassium: 2.6 mmol/L — CL (ref 3.5–5.1)
Sodium: 125 mmol/L — ABNORMAL LOW (ref 135–145)
Total Bilirubin: 0.9 mg/dL (ref 0.3–1.2)
Total Protein: 6.2 g/dL — ABNORMAL LOW (ref 6.5–8.1)

## 2020-06-18 LAB — SAMPLE TO BLOOD BANK

## 2020-06-18 MED ORDER — SODIUM CHLORIDE 0.9 % IV SOLN
2.0000 g | Freq: Once | INTRAVENOUS | Status: AC
  Administered 2020-06-18: 2 g via INTRAVENOUS
  Filled 2020-06-18: qty 20

## 2020-06-18 MED ORDER — HEPARIN SOD (PORK) LOCK FLUSH 100 UNIT/ML IV SOLN
500.0000 [IU] | Freq: Once | INTRAVENOUS | Status: AC
  Administered 2020-06-18: 500 [IU] via INTRAVENOUS
  Filled 2020-06-18: qty 5

## 2020-06-18 MED ORDER — HEPARIN SOD (PORK) LOCK FLUSH 100 UNIT/ML IV SOLN
INTRAVENOUS | Status: AC
  Filled 2020-06-18: qty 5

## 2020-06-18 MED ORDER — SODIUM CHLORIDE 0.9 % IV SOLN
150.0000 mg | Freq: Once | INTRAVENOUS | Status: AC
  Administered 2020-06-18: 150 mg via INTRAVENOUS
  Filled 2020-06-18: qty 5

## 2020-06-18 MED ORDER — SODIUM CHLORIDE 0.9 % IV SOLN
Freq: Once | INTRAVENOUS | Status: AC
  Filled 2020-06-18: qty 250

## 2020-06-18 MED ORDER — SODIUM CHLORIDE 0.9 % IV SOLN
10.0000 mg | Freq: Once | INTRAVENOUS | Status: AC
  Administered 2020-06-18: 10 mg via INTRAVENOUS
  Filled 2020-06-18: qty 1

## 2020-06-18 MED ORDER — PALONOSETRON HCL INJECTION 0.25 MG/5ML
0.2500 mg | Freq: Once | INTRAVENOUS | Status: AC
  Administered 2020-06-18: 0.25 mg via INTRAVENOUS
  Filled 2020-06-18: qty 5

## 2020-06-18 MED ORDER — SODIUM CHLORIDE 0.9% FLUSH
10.0000 mL | Freq: Once | INTRAVENOUS | Status: AC
  Administered 2020-06-18: 10 mL via INTRAVENOUS
  Filled 2020-06-18: qty 10

## 2020-06-18 NOTE — Progress Notes (Signed)
[place]  [phone] [phone]  ID: Tracy Jennings OB: 09/29/1955  MR#: [id number]  XHB#:[id number]  Patient Care Team: Sharyne Peach, MD as PCP - General (Family Medicine) Wellington Hampshire, MD as PCP - Cardiology (Cardiology) Noreene Filbert, MD as Radiation Oncologist (Radiation Oncology) Clent Jacks, RN as Oncology Nurse Navigator Murlean Iba, MD (Nephrology)  CHIEF COMPLAINT: Cervical cancer  INTERVAL HISTORY: Patient returns to clinic today for further evaluation and reconsideration of cycle 4 of weekly cisplatin.  She has worsening nausea and vomiting today.  She continues to have significant weakness and fatigue.  She has a poor appetite.  She has chronic abdominal pain. She is on home peritoneal dialysis for end-stage renal disease.  She has no neurologic complaints.  She denies any recent fevers or illnesses. She has no chest pain, shortness of breath, cough, or hemoptysis.  She does not make urine.  Patient feels generally terrible, but offers no further specific complaints today.  REVIEW OF SYSTEMS:   Review of Systems  Constitutional: Positive for malaise/fatigue. Negative for fever and weight loss.  Respiratory: Negative.  Negative for cough, hemoptysis and shortness of breath.   Cardiovascular: Negative.  Negative for chest pain and leg swelling.  Gastrointestinal: Positive for abdominal pain, nausea and vomiting. Negative for diarrhea.  Genitourinary: Negative.  Negative for dysuria.  Musculoskeletal: Negative.  Negative for back pain.  Skin: Negative.  Negative for rash.  Neurological: Positive for weakness. Negative for dizziness, focal weakness and headaches.  Psychiatric/Behavioral: Negative.  The patient is not nervous/anxious.     As per HPI. Otherwise, a complete review of systems is negative.  PAST MEDICAL HISTORY: Past Medical History:  Diagnosis Date  . Anemia 04/2018   low iron. to be started on  supplements  . Cervical cancer ([place])   . CKD (chronic kidney disease)    Stage IV  . Complication of anesthesia    receceived too much anesthesia, that she was in coma for a couple days   . COVID-19 virus detected 10/28/2019  . Diabetes mellitus without complication ([place])    type II  . ESRD (end stage renal disease) ([place])   . Heart murmur    followed as a child only  . HSIL (high grade squamous intraepithelial lesion) on Pap smear of cervix   . Hyperlipidemia associated with type 2 diabetes mellitus ([place])   . Hypertension   . Pancreatitis   . Peripheral vascular disease ([place])     PAST SURGICAL HISTORY: Past Surgical History:  Procedure Laterality Date  . AMPUTATION TOE Left 2013   2nd toe. tip of toe (toe nail was infected)  . AV FISTULA PLACEMENT Left 05/11/2018   Procedure: ARTERIOVENOUS (AV) FISTULA CREATION;  Surgeon: Katha Cabal, MD;  Location: [facility] ORS;  Service: Vascular;  Laterality: Left;  . CATARACT EXTRACTION    . CERVICAL CONIZATION W/BX N/A 04/08/2020   Procedure: CONIZATION CERVIX WITH BIOPSY;  Surgeon: Mellody Drown, MD;  Location: [facility] OR[facility];  Service: Gynecology;  Laterality: N/A;  . CHOLECYSTECTOMY  2014  . COLONOSCOPY    . COLONOSCOPY WITH PROPOFOL N/A 01/10/2018   Procedure: COLONOSCOPY WITH PROPOFOL;  Surgeon: Toledo, Benay Pike, MD;  Location: [facility];  Service: Gastroenterology;  Laterality: N/A;  . DIALYSIS/PERMA CATHETER INSERTION N/A 05/21/2018   Procedure: DIALYSIS/PERMA CATHETER INSERTION;  Surgeon: Algernon Huxley, MD;  Location: [place] [facility];  Service: Cardiovascular;  Laterality: N/A;  . DIALYSIS/PERMA CATHETER REMOVAL N/A 04/11/2019  Procedure: DIALYSIS/PERMA CATHETER REMOVAL;  Surgeon: Algernon Huxley, MD;  Location: [place] [facility];  Service: Cardiovascular;  Laterality: N/A;  . EYE SURGERY Bilateral 2018   cataract extractions  . IR IMAGING GUIDED PORT INSERTION  06/05/2020  . THROMBECTOMY W/ EMBOLECTOMY  05/11/2018   Procedure:  THROMBECTOMY ARTERIOVENOUS FISTULA;  Surgeon: Katha Cabal, MD;  Location: [facility];  Service: Vascular;;  . TUBAL LIGATION  1984    FAMILY HISTORY: Family History  Problem Relation Age of Onset  . Stroke Mother   . Hypertension Mother   . Gout Mother   . Cancer Father   . Diabetes Sister   . Diabetes Maternal Grandmother   . Diabetes Maternal Grandfather   . Diabetes Paternal Grandmother   . Diabetes Paternal Grandfather     ADVANCED DIRECTIVES (Y/N):  N  HEALTH MAINTENANCE: Social History   Tobacco Use  . Smoking status: Never Smoker  . Smokeless tobacco: Never Used  Vaping Use  . Vaping Use: Never used  Substance Use Topics  . Alcohol use: No  . Drug use: Never     Colonoscopy:  PAP:  Bone density:  Lipid panel:  Allergies  Allergen Reactions  . Amlodipine Swelling    Knees down to ankles Knees down to ankles  . Hctz [Hydrochlorothiazide]     pancreatitis  . Heparin Other (See Comments)    Pt reports cardiac arrest when given heparin  . Lmw Heparin     Reports cardiac arrest when given heparin  . Other Other (See Comments)    Reports cardiac arrest when given during surgery Reports cardiac arrest when given during surgery Reports cardiac arrest when given during surgery   . Sulfa Antibiotics Rash  . Dairy Aid [Lactase]     Runny nose  . Hydralazine Nausea Only    Current Outpatient Medications  Medication Sig Dispense Refill  . calcitRIOL (ROCALTROL) 0.25 MCG capsule Take 0.25 mcg by mouth daily.    . calcium acetate (PHOSLO) 667 MG capsule Take 1,334 mg by mouth 2 (two) times daily with a meal.     . carvedilol (COREG) 25 MG tablet Take 25 mg by mouth 2 (two) times daily with a meal.     . clopidogrel (PLAVIX) 75 MG tablet Take 75 mg by mouth daily.     Marland Kitchen DILT-XR 180 MG 24 hr capsule Take 180 mg by mouth daily.    Marland Kitchen diltiazem (DILACOR XR) 120 MG 24 hr capsule Take 120 mg by mouth daily.     . diphenoxylate-atropine (LOMOTIL) 2.5-0.025 MG  tablet Take 1 tablet by mouth 4 (four) times daily as needed for diarrhea or loose stools. 30 tablet 0  . lidocaine-prilocaine (EMLA) cream Apply 1 application topically as needed. Apply to port 1-2 hours prior to chemotherapy, cover with plastic wrap. 30 g 2  . loperamide (IMODIUM A-D) 2 MG tablet Take 2 mg by mouth 4 (four) times daily as needed for diarrhea or loose stools.    . Potassium Chloride ER 20 MEQ TBCR Take 1 tablet by mouth daily.     . prochlorperazine (COMPAZINE) 10 MG tablet Take 1 tablet (10 mg total) by mouth every 6 (six) hours as needed (Nausea or vomiting). 60 tablet 1   No current facility-administered medications for this visit.   Facility-Administered Medications Ordered in Other Visits  Medication Dose Route Frequency Provider Last Rate Last Admin  . 0.9 %  sodium chloride infusion   Intravenous Once Lloyd Huger, MD      .  dexamethasone (DECADRON) 10 mg in sodium chloride 0.9 % 50 mL IVPB  10 mg Intravenous Once Lloyd Huger, MD      . fosaprepitant (EMEND) 150 mg in sodium chloride 0.9 % 145 mL IVPB  150 mg Intravenous Once Lloyd Huger, MD      . palonosetron (ALOXI) injection 0.25 mg  0.25 mg Intravenous Once Lloyd Huger, MD        OBJECTIVE: Vitals:   06/18/20 1007  BP: (!) 196/94  Pulse: 98  Resp: 20  Temp: 97.6 F (36.4 C)  SpO2: 100%     Body mass index is 22.02 kg/m.    ECOG FS:1 - Symptomatic but completely ambulatory  General: Ill-appearing, no acute distress.  Sitting in a wheelchair. Eyes: Pink conjunctiva, anicteric sclera. HEENT: Normocephalic, moist mucous membranes. Lungs: No audible wheezing or coughing. Heart: Regular rate and rhythm. Abdomen: Soft, nontender, no obvious distention. Musculoskeletal: No edema, cyanosis, or clubbing. Neuro: Alert, answering all questions appropriately. Cranial nerves grossly intact. Skin: No rashes or petechiae noted. Psych: Normal affect.    LAB RESULTS:  Lab Results    Component Value Date   NA 124 (L) 06/15/2020   K 2.3 (LL) 06/15/2020   CL 86 (L) 06/15/2020   CO2 24 06/15/2020   GLUCOSE 83 06/15/2020   BUN 49 (H) 06/15/2020   CREATININE 13.59 (H) 06/15/2020   CALCIUM 5.9 (LL) 06/15/2020   PROT 5.9 (L) 06/15/2020   ALBUMIN 2.1 (L) 06/15/2020   AST 11 (L) 06/15/2020   ALT 5 06/15/2020   ALKPHOS 53 06/15/2020   BILITOT 0.7 06/15/2020   GFRNONAA 3 (L) 06/15/2020   GFRAA 3 (L) 06/15/2020    Lab Results  Component Value Date   WBC 6.0 06/18/2020   NEUTROABS 5.5 06/18/2020   HGB 8.6 (L) 06/18/2020   HCT 23.8 (L) 06/18/2020   MCV 88.1 06/18/2020   PLT 131 (L) 06/18/2020     STUDIES: IR IMAGING GUIDED PORT INSERTION  Result Date: 06/05/2020 INDICATION: 63 year old with cervical cancer.  Port-A-Cath needed for therapy. EXAM: FLUOROSCOPIC AND ULTRASOUND GUIDED PLACEMENT OF A SUBCUTANEOUS PORT COMPARISON:  None. MEDICATIONS: Ancef 2 g; The antibiotic was administered within an appropriate time interval prior to skin puncture. ANESTHESIA/SEDATION: Versed 1.0 mg IV; Fentanyl 50 mcg IV; Moderate Sedation Time:  42 minutes The patient was continuously monitored during the procedure by the interventional radiology nurse under my direct supervision. FLUOROSCOPY TIME:  2 minutes, 6 seconds, 4 mGy COMPLICATIONS: None immediate. PROCEDURE: The procedure, risks, benefits, and alternatives were explained to the patient. Questions regarding the procedure were encouraged and answered. The patient understands and consents to the procedure. Patient was placed supine on the interventional table. Ultrasound confirmed a patent right internal jugular vein. Ultrasound image was saved for documentation. The right chest and neck were cleaned with a skin antiseptic and a sterile drape was placed. Maximal barrier sterile technique was utilized including caps, mask, sterile gowns, sterile gloves, sterile drape, hand hygiene and skin antiseptic. The right neck was anesthetized with  1% lidocaine. Small incision was made in the right neck with a blade. Micropuncture set was placed in the right internal jugular vein with ultrasound guidance. The micropuncture wire was used for measurement purposes. The right chest was anesthetized with 1% lidocaine. #15 blade was used to make an incision and a subcutaneous port pocket was formed. [place] was assembled. Subcutaneous tunnel was formed with a stiff tunneling device. The port catheter was brought through  the subcutaneous tunnel. The port was placed in the subcutaneous pocket and sutured in place. The micropuncture set was exchanged for a peel-away sheath. The catheter was placed through the peel-away sheath and the tip was positioned at the superior cavoatrial junction. Catheter placement was confirmed with fluoroscopy. The port was accessed and flushed with saline (due to heparin allergy). The port pocket was closed using two layers of absorbable sutures and Dermabond. The vein skin site was closed using a single layer of absorbable suture and Dermabond. Sterile dressings were applied. Patient tolerated the procedure well without an immediate complication. Ultrasound and fluoroscopic images were taken and saved for this procedure. IMPRESSION: Placement of a subcutaneous port device. Catheter tip at the superior cavoatrial junction. Electronically Signed   By: Markus Daft M.D.   On: 06/05/2020 15:05    ASSESSMENT: Cervical cancer.  PLAN:    1.  Cervical cancer: PET scan results from March 15, 2020 reviewed independently with low-level activity in the region of cervix corresponding to primary neoplasm.  Patient has some low activity in bilateral lymph nodes, but unclear if these are reactive or metastatic lesions.  She will benefit from concurrent radiation and chemotherapy.  Will dose reduce cisplatin 50% secondary to her end-stage renal disease.  Patient received cycle 3 on June 08, 2020.  We will discontinue chemotherapy  altogether.  She will have her final treatment of XRT today.  Patient will now pursue brachytherapy at [facility].  She will follow up at the conclusion of those treatments.   2.  End-stage renal disease: Continue nightly peritoneal dialysis.  Patient reports that she is on the kidney transplant list.  This diagnosis or treatment should not change that status.  Case has been discussed with nephrology.  Laboratory results have been forwarded to nephrology to see any adjustments are needed in her dialysate. 3.  Hypokalemia: Chronic and unchanged.  Continue oral potassium supplementation as prescribed.  Labs forwarded to nephrology as above. 4.  Anemia: Hemoglobin improved to 8.6 with 1 unit packed red blood cells earlier this week.  Return to clinic in 1 week for repeat laboratory work and consideration of additional blood.  Patient receives Procrit with her dialysis. 5.  Hypocalcemia: Chronic and unchanged.  Adjust dialysate per nephrology as above. 6.  Diarrhea: Patient does not complain of this today.  Secondary to XRT.  Continue Lomotil as needed. 7.  Nausea/vomiting: Discontinue cisplatin as above.  Patient instead will receive IV fluids and IV antiemetics.  Patient expressed understanding and was in agreement with this plan. She also understands that She can call clinic at any time with any questions, concerns, or complaints.   Cancer Staging No matching staging information was found for the patient.  Lloyd Huger, MD   06/18/2020 10:29 AM

## 2020-06-18 NOTE — Progress Notes (Signed)
No cisplatin today. Fluids/antiemetics only, Ca 6.1, given 2g Ca supp. K 2.6, pt is on oral K supp. Pt on nightly peritoneal dialysis. Dr. Grayland Ormond has notified nephrology of patient's lab results per today's notes. Pt reports feeling much better following fluids and antiemetics today. Transported to rad onc via wheelchair.

## 2020-06-18 NOTE — Progress Notes (Signed)
Patient states today at follow up she is feeling very nauseous and having stomach pain. She rates pain at 5. She states she has been feeling this way since last night. She reports she has not taken any medications today due to nausea. BP was elevated 196/96. Also states she had tried taking something for nausea last night but did not help

## 2020-06-20 ENCOUNTER — Encounter: Payer: Self-pay | Admitting: Emergency Medicine

## 2020-06-20 ENCOUNTER — Other Ambulatory Visit: Payer: Self-pay

## 2020-06-20 DIAGNOSIS — D696 Thrombocytopenia, unspecified: Secondary | ICD-10-CM | POA: Diagnosis not present

## 2020-06-20 DIAGNOSIS — G8929 Other chronic pain: Secondary | ICD-10-CM | POA: Diagnosis present

## 2020-06-20 DIAGNOSIS — E1169 Type 2 diabetes mellitus with other specified complication: Secondary | ICD-10-CM | POA: Diagnosis present

## 2020-06-20 DIAGNOSIS — K209 Esophagitis, unspecified without bleeding: Secondary | ICD-10-CM | POA: Diagnosis present

## 2020-06-20 DIAGNOSIS — E1151 Type 2 diabetes mellitus with diabetic peripheral angiopathy without gangrene: Secondary | ICD-10-CM | POA: Diagnosis present

## 2020-06-20 DIAGNOSIS — Z20822 Contact with and (suspected) exposure to covid-19: Secondary | ICD-10-CM | POA: Diagnosis present

## 2020-06-20 DIAGNOSIS — E860 Dehydration: Secondary | ICD-10-CM | POA: Diagnosis present

## 2020-06-20 DIAGNOSIS — R112 Nausea with vomiting, unspecified: Secondary | ICD-10-CM | POA: Diagnosis present

## 2020-06-20 DIAGNOSIS — Z5329 Procedure and treatment not carried out because of patient's decision for other reasons: Secondary | ICD-10-CM | POA: Diagnosis not present

## 2020-06-20 DIAGNOSIS — Z7902 Long term (current) use of antithrombotics/antiplatelets: Secondary | ICD-10-CM

## 2020-06-20 DIAGNOSIS — D62 Acute posthemorrhagic anemia: Secondary | ICD-10-CM | POA: Diagnosis present

## 2020-06-20 DIAGNOSIS — L89152 Pressure ulcer of sacral region, stage 2: Secondary | ICD-10-CM | POA: Diagnosis present

## 2020-06-20 DIAGNOSIS — I120 Hypertensive chronic kidney disease with stage 5 chronic kidney disease or end stage renal disease: Secondary | ICD-10-CM | POA: Diagnosis present

## 2020-06-20 DIAGNOSIS — T451X5A Adverse effect of antineoplastic and immunosuppressive drugs, initial encounter: Secondary | ICD-10-CM | POA: Diagnosis present

## 2020-06-20 DIAGNOSIS — Z923 Personal history of irradiation: Secondary | ICD-10-CM

## 2020-06-20 DIAGNOSIS — E871 Hypo-osmolality and hyponatremia: Secondary | ICD-10-CM | POA: Diagnosis present

## 2020-06-20 DIAGNOSIS — Z9221 Personal history of antineoplastic chemotherapy: Secondary | ICD-10-CM

## 2020-06-20 DIAGNOSIS — B370 Candidal stomatitis: Secondary | ICD-10-CM | POA: Diagnosis present

## 2020-06-20 DIAGNOSIS — Z79899 Other long term (current) drug therapy: Secondary | ICD-10-CM

## 2020-06-20 DIAGNOSIS — I493 Ventricular premature depolarization: Secondary | ICD-10-CM | POA: Diagnosis present

## 2020-06-20 DIAGNOSIS — R109 Unspecified abdominal pain: Secondary | ICD-10-CM | POA: Diagnosis present

## 2020-06-20 DIAGNOSIS — Z8616 Personal history of COVID-19: Secondary | ICD-10-CM

## 2020-06-20 DIAGNOSIS — Z6826 Body mass index (BMI) 26.0-26.9, adult: Secondary | ICD-10-CM

## 2020-06-20 DIAGNOSIS — C539 Malignant neoplasm of cervix uteri, unspecified: Secondary | ICD-10-CM | POA: Diagnosis present

## 2020-06-20 DIAGNOSIS — K529 Noninfective gastroenteritis and colitis, unspecified: Secondary | ICD-10-CM | POA: Diagnosis present

## 2020-06-20 DIAGNOSIS — R627 Adult failure to thrive: Secondary | ICD-10-CM | POA: Diagnosis present

## 2020-06-20 DIAGNOSIS — D631 Anemia in chronic kidney disease: Secondary | ICD-10-CM | POA: Diagnosis present

## 2020-06-20 DIAGNOSIS — Y929 Unspecified place or not applicable: Secondary | ICD-10-CM

## 2020-06-20 DIAGNOSIS — Z8249 Family history of ischemic heart disease and other diseases of the circulatory system: Secondary | ICD-10-CM

## 2020-06-20 DIAGNOSIS — N186 End stage renal disease: Secondary | ICD-10-CM | POA: Diagnosis present

## 2020-06-20 DIAGNOSIS — E876 Hypokalemia: Secondary | ICD-10-CM | POA: Diagnosis present

## 2020-06-20 DIAGNOSIS — K652 Spontaneous bacterial peritonitis: Principal | ICD-10-CM | POA: Diagnosis present

## 2020-06-20 DIAGNOSIS — E861 Hypovolemia: Secondary | ICD-10-CM | POA: Diagnosis present

## 2020-06-20 DIAGNOSIS — Z823 Family history of stroke: Secondary | ICD-10-CM

## 2020-06-20 DIAGNOSIS — D630 Anemia in neoplastic disease: Secondary | ICD-10-CM | POA: Diagnosis present

## 2020-06-20 DIAGNOSIS — Z809 Family history of malignant neoplasm, unspecified: Secondary | ICD-10-CM

## 2020-06-20 DIAGNOSIS — Z992 Dependence on renal dialysis: Secondary | ICD-10-CM

## 2020-06-20 DIAGNOSIS — Z9049 Acquired absence of other specified parts of digestive tract: Secondary | ICD-10-CM

## 2020-06-20 DIAGNOSIS — Z888 Allergy status to other drugs, medicaments and biological substances status: Secondary | ICD-10-CM

## 2020-06-20 DIAGNOSIS — N2581 Secondary hyperparathyroidism of renal origin: Secondary | ICD-10-CM | POA: Diagnosis present

## 2020-06-20 DIAGNOSIS — Z833 Family history of diabetes mellitus: Secondary | ICD-10-CM

## 2020-06-20 MED ORDER — OXYCODONE-ACETAMINOPHEN 5-325 MG PO TABS
1.0000 | ORAL_TABLET | Freq: Once | ORAL | Status: AC
  Administered 2020-06-20: 1 via ORAL
  Filled 2020-06-20: qty 1

## 2020-06-20 MED ORDER — ONDANSETRON 4 MG PO TBDP
4.0000 mg | ORAL_TABLET | Freq: Once | ORAL | Status: AC
  Administered 2020-06-20: 4 mg via ORAL
  Filled 2020-06-20: qty 1

## 2020-06-20 NOTE — ED Triage Notes (Signed)
Pt arrives POV to triage with c/o "pain all over". Pt is a cancer survivor just recently out of treatment. Pt states that she has had diarrhea x 1 month and that she is having some abdominal pain. Pt appears in NAD at this time.

## 2020-06-21 ENCOUNTER — Inpatient Hospital Stay
Admission: EM | Admit: 2020-06-21 | Discharge: 2020-07-01 | DRG: 371 | Disposition: A | Discharge status: Home / Self Care | Payer: BC Managed Care – PPO | Attending: Internal Medicine | Admitting: Internal Medicine

## 2020-06-21 ENCOUNTER — Emergency Department: Payer: BC Managed Care – PPO

## 2020-06-21 ENCOUNTER — Other Ambulatory Visit: Payer: Self-pay

## 2020-06-21 DIAGNOSIS — R531 Weakness: Secondary | ICD-10-CM

## 2020-06-21 DIAGNOSIS — C530 Malignant neoplasm of endocervix: Secondary | ICD-10-CM

## 2020-06-21 DIAGNOSIS — Z992 Dependence on renal dialysis: Secondary | ICD-10-CM | POA: Diagnosis not present

## 2020-06-21 DIAGNOSIS — Z8616 Personal history of COVID-19: Secondary | ICD-10-CM | POA: Diagnosis not present

## 2020-06-21 DIAGNOSIS — N186 End stage renal disease: Secondary | ICD-10-CM | POA: Diagnosis present

## 2020-06-21 DIAGNOSIS — K921 Melena: Secondary | ICD-10-CM | POA: Diagnosis not present

## 2020-06-21 DIAGNOSIS — I10 Essential (primary) hypertension: Secondary | ICD-10-CM

## 2020-06-21 DIAGNOSIS — L89152 Pressure ulcer of sacral region, stage 2: Secondary | ICD-10-CM | POA: Diagnosis present

## 2020-06-21 DIAGNOSIS — K209 Esophagitis, unspecified without bleeding: Secondary | ICD-10-CM | POA: Diagnosis present

## 2020-06-21 DIAGNOSIS — Z7189 Other specified counseling: Secondary | ICD-10-CM | POA: Diagnosis not present

## 2020-06-21 DIAGNOSIS — C539 Malignant neoplasm of cervix uteri, unspecified: Secondary | ICD-10-CM | POA: Diagnosis present

## 2020-06-21 DIAGNOSIS — R627 Adult failure to thrive: Secondary | ICD-10-CM

## 2020-06-21 DIAGNOSIS — E871 Hypo-osmolality and hyponatremia: Secondary | ICD-10-CM | POA: Diagnosis present

## 2020-06-21 DIAGNOSIS — K529 Noninfective gastroenteritis and colitis, unspecified: Secondary | ICD-10-CM | POA: Diagnosis present

## 2020-06-21 DIAGNOSIS — E1169 Type 2 diabetes mellitus with other specified complication: Secondary | ICD-10-CM | POA: Diagnosis present

## 2020-06-21 DIAGNOSIS — R112 Nausea with vomiting, unspecified: Secondary | ICD-10-CM

## 2020-06-21 DIAGNOSIS — D631 Anemia in chronic kidney disease: Secondary | ICD-10-CM | POA: Diagnosis present

## 2020-06-21 DIAGNOSIS — R1084 Generalized abdominal pain: Secondary | ICD-10-CM | POA: Diagnosis not present

## 2020-06-21 DIAGNOSIS — D62 Acute posthemorrhagic anemia: Secondary | ICD-10-CM | POA: Diagnosis present

## 2020-06-21 DIAGNOSIS — Z923 Personal history of irradiation: Secondary | ICD-10-CM | POA: Diagnosis not present

## 2020-06-21 DIAGNOSIS — D630 Anemia in neoplastic disease: Secondary | ICD-10-CM | POA: Diagnosis present

## 2020-06-21 DIAGNOSIS — G8929 Other chronic pain: Secondary | ICD-10-CM

## 2020-06-21 DIAGNOSIS — E1151 Type 2 diabetes mellitus with diabetic peripheral angiopathy without gangrene: Secondary | ICD-10-CM | POA: Diagnosis present

## 2020-06-21 DIAGNOSIS — L8990 Pressure ulcer of unspecified site, unspecified stage: Secondary | ICD-10-CM | POA: Insufficient documentation

## 2020-06-21 DIAGNOSIS — B370 Candidal stomatitis: Secondary | ICD-10-CM

## 2020-06-21 DIAGNOSIS — Z515 Encounter for palliative care: Secondary | ICD-10-CM | POA: Diagnosis not present

## 2020-06-21 DIAGNOSIS — R1110 Vomiting, unspecified: Secondary | ICD-10-CM | POA: Diagnosis present

## 2020-06-21 DIAGNOSIS — K652 Spontaneous bacterial peritonitis: Secondary | ICD-10-CM | POA: Diagnosis present

## 2020-06-21 DIAGNOSIS — Z9221 Personal history of antineoplastic chemotherapy: Secondary | ICD-10-CM | POA: Diagnosis not present

## 2020-06-21 DIAGNOSIS — D638 Anemia in other chronic diseases classified elsewhere: Secondary | ICD-10-CM | POA: Diagnosis not present

## 2020-06-21 DIAGNOSIS — E876 Hypokalemia: Secondary | ICD-10-CM | POA: Diagnosis present

## 2020-06-21 DIAGNOSIS — D649 Anemia, unspecified: Secondary | ICD-10-CM | POA: Diagnosis not present

## 2020-06-21 DIAGNOSIS — Z20822 Contact with and (suspected) exposure to covid-19: Secondary | ICD-10-CM | POA: Diagnosis present

## 2020-06-21 DIAGNOSIS — Y929 Unspecified place or not applicable: Secondary | ICD-10-CM | POA: Diagnosis not present

## 2020-06-21 DIAGNOSIS — I120 Hypertensive chronic kidney disease with stage 5 chronic kidney disease or end stage renal disease: Secondary | ICD-10-CM | POA: Diagnosis present

## 2020-06-21 DIAGNOSIS — Z6826 Body mass index (BMI) 26.0-26.9, adult: Secondary | ICD-10-CM | POA: Diagnosis not present

## 2020-06-21 DIAGNOSIS — N2581 Secondary hyperparathyroidism of renal origin: Secondary | ICD-10-CM | POA: Diagnosis present

## 2020-06-21 DIAGNOSIS — R197 Diarrhea, unspecified: Secondary | ICD-10-CM

## 2020-06-21 LAB — BASIC METABOLIC PANEL
Anion gap: 19 — ABNORMAL HIGH (ref 5–15)
BUN: 88 mg/dL — ABNORMAL HIGH (ref 8–23)
CO2: 20 mmol/L — ABNORMAL LOW (ref 22–32)
Calcium: 6.6 mg/dL — ABNORMAL LOW (ref 8.9–10.3)
Chloride: 90 mmol/L — ABNORMAL LOW (ref 98–111)
Creatinine, Ser: 18.12 mg/dL — ABNORMAL HIGH (ref 0.44–1.00)
GFR, Estimated: 2 mL/min — ABNORMAL LOW (ref >60)
Glucose, Bld: 74 mg/dL (ref 70–99)
Potassium: 2.9 mmol/L — ABNORMAL LOW (ref 3.5–5.1)
Sodium: 129 mmol/L — ABNORMAL LOW (ref 135–145)

## 2020-06-21 LAB — CBC WITH DIFFERENTIAL/PLATELET
Abs Immature Granulocytes: 0.04 10*3/uL (ref 0.00–0.07)
Basophils Absolute: 0 10*3/uL (ref 0.0–0.1)
Basophils Relative: 0 %
Eosinophils Absolute: 0 10*3/uL (ref 0.0–0.5)
Eosinophils Relative: 0 %
HCT: 26.6 % — ABNORMAL LOW (ref 36.0–46.0)
Hemoglobin: 9.7 g/dL — ABNORMAL LOW (ref 12.0–15.0)
Immature Granulocytes: 1 %
Lymphocytes Relative: 1 %
Lymphs Abs: 0.1 10*3/uL — ABNORMAL LOW (ref 0.7–4.0)
MCH: 33 pg (ref 26.0–34.0)
MCHC: 36.5 g/dL — ABNORMAL HIGH (ref 30.0–36.0)
MCV: 90.5 fL (ref 80.0–100.0)
Monocytes Absolute: 0.3 10*3/uL (ref 0.1–1.0)
Monocytes Relative: 4 %
Neutro Abs: 6.8 10*3/uL (ref 1.7–7.7)
Neutrophils Relative %: 94 %
Platelets: 164 10*3/uL (ref 150–400)
RBC: 2.94 MIL/uL — ABNORMAL LOW (ref 3.87–5.11)
RDW: 14.6 % (ref 11.5–15.5)
WBC: 7.2 10*3/uL (ref 4.0–10.5)
nRBC: 0 % (ref 0.0–0.2)

## 2020-06-21 LAB — RESPIRATORY PANEL BY RT PCR (FLU A&B, COVID)
Influenza A by PCR: NEGATIVE
Influenza B by PCR: NEGATIVE
SARS Coronavirus 2 by RT PCR: NEGATIVE

## 2020-06-21 LAB — MAGNESIUM: Magnesium: 1.6 mg/dL — ABNORMAL LOW (ref 1.7–2.4)

## 2020-06-21 LAB — GLUCOSE, CAPILLARY: Glucose-Capillary: 88 mg/dL (ref 70–99)

## 2020-06-21 LAB — LIPASE, BLOOD: Lipase: 46 U/L (ref 11–51)

## 2020-06-21 MED ORDER — FENTANYL CITRATE (PF) 100 MCG/2ML IJ SOLN
50.0000 ug | Freq: Once | INTRAMUSCULAR | Status: AC
  Administered 2020-06-21: 50 ug via INTRAVENOUS
  Filled 2020-06-21: qty 2

## 2020-06-21 MED ORDER — SODIUM CHLORIDE 0.9 % IV BOLUS
500.0000 mL | Freq: Once | INTRAVENOUS | Status: AC
  Administered 2020-06-21: 500 mL via INTRAVENOUS

## 2020-06-21 MED ORDER — KCL IN DEXTROSE-NACL 40-5-0.9 MEQ/L-%-% IV SOLN
INTRAVENOUS | Status: DC
  Filled 2020-06-21: qty 1000

## 2020-06-21 MED ORDER — DILTIAZEM HCL ER COATED BEADS 180 MG PO CP24
180.0000 mg | ORAL_CAPSULE | Freq: Every day | ORAL | Status: DC
  Administered 2020-06-21 – 2020-07-01 (×5): 180 mg via ORAL
  Filled 2020-06-21 (×11): qty 1

## 2020-06-21 MED ORDER — POTASSIUM CHLORIDE 10 MEQ/100ML IV SOLN
10.0000 meq | Freq: Once | INTRAVENOUS | Status: AC
  Administered 2020-06-21: 10 meq via INTRAVENOUS
  Filled 2020-06-21: qty 100

## 2020-06-21 MED ORDER — DIPHENOXYLATE-ATROPINE 2.5-0.025 MG PO TABS
1.0000 | ORAL_TABLET | Freq: Four times a day (QID) | ORAL | Status: DC | PRN
  Administered 2020-06-25 – 2020-06-26 (×3): 1 via ORAL
  Filled 2020-06-21 (×4): qty 1

## 2020-06-21 MED ORDER — MAGNESIUM SULFATE 2 GM/50ML IV SOLN
2.0000 g | Freq: Once | INTRAVENOUS | Status: AC
  Administered 2020-06-21: 2 g via INTRAVENOUS
  Filled 2020-06-21: qty 50

## 2020-06-21 MED ORDER — CARVEDILOL 25 MG PO TABS
25.0000 mg | ORAL_TABLET | Freq: Two times a day (BID) | ORAL | Status: DC
  Administered 2020-06-21 – 2020-07-01 (×9): 25 mg via ORAL
  Filled 2020-06-21 (×15): qty 1

## 2020-06-21 MED ORDER — CLOPIDOGREL BISULFATE 75 MG PO TABS
75.0000 mg | ORAL_TABLET | Freq: Every day | ORAL | Status: DC
  Administered 2020-06-21: 75 mg via ORAL
  Filled 2020-06-21 (×2): qty 1

## 2020-06-21 MED ORDER — ONDANSETRON HCL 4 MG PO TABS: 4.0000 mg | ORAL_TABLET | Freq: Four times a day (QID) | ORAL | Status: DC | PRN

## 2020-06-21 MED ORDER — LABETALOL HCL 5 MG/ML IV SOLN
5.0000 mg | INTRAVENOUS | Status: DC | PRN
  Administered 2020-06-23 (×2): 5 mg via INTRAVENOUS
  Filled 2020-06-21 (×2): qty 4

## 2020-06-21 MED ORDER — POTASSIUM CHLORIDE IN NACL 20-0.9 MEQ/L-% IV SOLN
INTRAVENOUS | Status: AC
  Filled 2020-06-21: qty 1000

## 2020-06-21 MED ORDER — ACETAMINOPHEN 325 MG PO TABS
650.0000 mg | ORAL_TABLET | Freq: Four times a day (QID) | ORAL | Status: DC | PRN
  Administered 2020-06-22: 650 mg via ORAL
  Filled 2020-06-21 (×3): qty 2

## 2020-06-21 MED ORDER — ACETAMINOPHEN 650 MG RE SUPP: 650.0000 mg | Freq: Four times a day (QID) | RECTAL | Status: DC | PRN

## 2020-06-21 MED ORDER — HYDRALAZINE HCL 20 MG/ML IJ SOLN
10.0000 mg | Freq: Four times a day (QID) | INTRAMUSCULAR | Status: DC | PRN
  Administered 2020-06-23 – 2020-06-27 (×8): 10 mg via INTRAVENOUS
  Filled 2020-06-21 (×8): qty 1

## 2020-06-21 MED ORDER — SODIUM CHLORIDE 0.9 % IV BOLUS: 1000.0000 mL | Freq: Once | INTRAVENOUS | Status: DC

## 2020-06-21 MED ORDER — CALCITRIOL 0.25 MCG PO CAPS
0.2500 ug | ORAL_CAPSULE | Freq: Every day | ORAL | Status: DC
  Administered 2020-06-21 – 2020-06-29 (×2): 0.25 ug via ORAL
  Filled 2020-06-21 (×9): qty 1

## 2020-06-21 MED ORDER — HALOPERIDOL LACTATE 5 MG/ML IJ SOLN
1.0000 mg | Freq: Once | INTRAMUSCULAR | Status: AC
  Administered 2020-06-21: 1 mg via INTRAVENOUS
  Filled 2020-06-21: qty 1

## 2020-06-21 MED ORDER — ONDANSETRON HCL 4 MG/2ML IJ SOLN
4.0000 mg | Freq: Four times a day (QID) | INTRAMUSCULAR | Status: DC | PRN
  Administered 2020-06-21 – 2020-06-29 (×18): 4 mg via INTRAVENOUS
  Filled 2020-06-21 (×19): qty 2

## 2020-06-21 MED ORDER — CALCIUM GLUCONATE-NACL 2-0.675 GM/100ML-% IV SOLN
2.0000 g | Freq: Once | INTRAVENOUS | Status: AC
  Administered 2020-06-21: 2000 mg via INTRAVENOUS
  Filled 2020-06-21: qty 100

## 2020-06-21 MED ORDER — CALCIUM ACETATE (PHOS BINDER) 667 MG PO CAPS
1334.0000 mg | ORAL_CAPSULE | Freq: Two times a day (BID) | ORAL | Status: DC
  Administered 2020-06-21 – 2020-07-01 (×5): 1334 mg via ORAL
  Filled 2020-06-21 (×13): qty 2

## 2020-06-21 MED ORDER — ONDANSETRON 4 MG PO TBDP
4.0000 mg | ORAL_TABLET | Freq: Once | ORAL | Status: AC
  Administered 2020-06-21: 4 mg via ORAL
  Filled 2020-06-21: qty 1

## 2020-06-21 NOTE — H&P (Signed)
History and Physical    Tracy Jennings WHQ:[id number] DOB: 1956/02/23 DOA: 06/21/2020  PCP: Tracy Peach, MD   Patient coming from: Home  I have personally briefly reviewed patient's old medical records in [place]  Chief Complaint: Nausea/vomiting                                Diarrhea  HPI: Tracy Jennings is a 64 y.o. female with medical history significant for end-stage renal disease on peritoneal dialysis, history of cervical cancer (and has completed chemo and radiation therapy), patient is scheduled to follow-up at [facility] for brachytherapy due to persistent disease and hypertension who presents to the emergency room for 1 week history of nausea, vomiting and diarrhea and generalized weakness.  Patient states she has been unable to tolerate any oral intake for over a week and also complains of abdominal pain which is generalized and a dull ache.  She states that her abdominal pain is chronic. She denies having any fever/chills, no cough, no shortness of breath, no chest pain, no dizziness or lightheadedness. She also states she has not been compliant with her peritoneal dialysis and her last treatment was 2 days prior to admission. Labs show sodium of 129, potassium of 2.9, chloride of 90, bicarb 20, glucose 75, BUN 88, creatinine 18.12, calcium 6.6, magnesium 1.6, white count 7.2, hemoglobin 9.7, hematocrit 26.6, MCV 90.5, RDW 14.6, platelet count 164 Twelve-lead EKG reviewed by me shows sinus rhythm with occasional PVCs and LVH    ED Course: Patient is a 64 year old African-American female with a history of end-stage renal disease on peritoneal dialysis, history of cervical cancer who has recently completed her chemoradiation therapy and is scheduled to follow-up at [facility] for brachytherapy who presents to the ER for 1 week history of nausea, vomiting and diarrhea.  She has electrolyte abnormalities which include hypokalemia, hyponatremia and hypomagnesemia.  She will be  referred to observation status for further evaluation.  Review of Systems: As per HPI otherwise 10 point review of systems negative.    Past Medical History:  Diagnosis Date  . Anemia 04/2018   low iron. to be started on supplements  . Cervical cancer ([place])   . CKD (chronic kidney disease)    Stage IV  . Complication of anesthesia    receceived too much anesthesia, that she was in coma for a couple days   . COVID-19 virus detected 10/28/2019  . Diabetes mellitus without complication ([place])    type II  . ESRD (end stage renal disease) ([place])   . Heart murmur    followed as a child only  . HSIL (high grade squamous intraepithelial lesion) on Pap smear of cervix   . Hyperlipidemia associated with type 2 diabetes mellitus ([place])   . Hypertension   . Pancreatitis   . Peripheral vascular disease [facility])     Past Surgical History:  Procedure Laterality Date  . AMPUTATION TOE Left 2013   2nd toe. tip of toe (toe nail was infected)  . AV FISTULA PLACEMENT Left 05/11/2018   Procedure: ARTERIOVENOUS (AV) FISTULA CREATION;  Surgeon: Katha Cabal, MD;  Location: [facility] ORS;  Service: Vascular;  Laterality: Left;  . CATARACT EXTRACTION    . CERVICAL CONIZATION W/BX N/A 04/08/2020   Procedure: CONIZATION CERVIX WITH BIOPSY;  Surgeon: Mellody Drown, MD;  Location: [facility] ORS;  Service: Gynecology;  Laterality: N/A;  . CHOLECYSTECTOMY  2014  .  COLONOSCOPY    . COLONOSCOPY WITH PROPOFOL N/A 01/10/2018   Procedure: COLONOSCOPY WITH PROPOFOL;  Surgeon: Toledo, Benay Pike, MD;  Location: [facility] ENDOSCOPY;  Service: Gastroenterology;  Laterality: N/A;  . DIALYSIS/PERMA CATHETER INSERTION N/A 05/21/2018   Procedure: DIALYSIS/PERMA CATHETER INSERTION;  Surgeon: Algernon Huxley, MD;  Location: [place] CV LAB;  Service: Cardiovascular;  Laterality: N/A;  . DIALYSIS/PERMA CATHETER REMOVAL N/A 04/11/2019   Procedure: DIALYSIS/PERMA CATHETER REMOVAL;  Surgeon: Algernon Huxley, MD;  Location: [place] [facility]  LAB;  Service: Cardiovascular;  Laterality: N/A;  . EYE SURGERY Bilateral 2018   cataract extractions  . IR IMAGING GUIDED PORT INSERTION  06/05/2020  . THROMBECTOMY W/ EMBOLECTOMY  05/11/2018   Procedure: THROMBECTOMY ARTERIOVENOUS FISTULA;  Surgeon: Katha Cabal, MD;  Location: [facility] ORS;  Service: Vascular;;  . [place]     reports that she has never smoked. She has never used smokeless tobacco. She reports that she does not drink alcohol and does not use drugs.  Allergies  Allergen Reactions  . Amlodipine Swelling    Knees down to ankles Knees down to ankles  . Hctz [Hydrochlorothiazide]     pancreatitis  . Heparin Other (See Comments)    Pt reports cardiac arrest when given heparin  . Lmw Heparin     Reports cardiac arrest when given heparin  . Other Other (See Comments)    Reports cardiac arrest when given during surgery Reports cardiac arrest when given during surgery Reports cardiac arrest when given during surgery   . Sulfa Antibiotics Rash  . Dairy Aid [Lactase]     Runny nose  . Hydralazine Nausea Only    Family History  Problem Relation Age of Onset  . Stroke Mother   . Hypertension Mother   . Gout Mother   . Cancer Father   . Diabetes Sister   . Diabetes Maternal Grandmother   . Diabetes Maternal Grandfather   . Diabetes Paternal Grandmother   . Diabetes Paternal Grandfather      Prior to Admission medications   Medication Sig Start Date End Date Taking? Authorizing Provider  calcitRIOL (ROCALTROL) 0.25 MCG capsule Take 0.25 mcg by mouth daily. 09/26/19  Yes [provider]  calcium acetate (PHOSLO) 667 MG capsule Take 1,334 mg by mouth 2 (two) times daily with a meal.  08/20/19  Yes [provider]  carvedilol (COREG) 25 MG tablet Take 25 mg by mouth 2 (two) times daily with a meal.    Yes [provider]  clopidogrel (PLAVIX) 75 MG tablet Take 75 mg by mouth daily.    Yes [provider]  DILT-XR 180  MG 24 hr capsule Take 180 mg by mouth daily.  05/01/20  Yes [provider]  diltiazem (DILACOR XR) 120 MG 24 hr capsule Take 120 mg by mouth daily.    Yes [provider]  diphenoxylate-atropine (LOMOTIL) 2.5-0.025 MG tablet Take 1 tablet by mouth 4 (four) times daily as needed for diarrhea or loose stools. 06/08/20  Yes Lloyd Huger, MD  lidocaine-prilocaine (EMLA) cream Apply 1 application topically as needed. Apply to port 1-2 hours prior to chemotherapy, cover with plastic wrap. 06/05/20  Yes Lloyd Huger, MD  loperamide (IMODIUM A-D) 2 MG tablet Take 2 mg by mouth 4 (four) times daily as needed for diarrhea or loose stools.   Yes [provider]  ondansetron (ZOFRAN) 4 MG tablet Take 4 mg by mouth 2 (two) times daily.  06/10/20  Yes [provider]  Potassium Chloride ER 20 MEQ TBCR Take 1 tablet by mouth daily.  05/12/20  Yes [provider]  prochlorperazine (COMPAZINE) 10 MG tablet Take 1 tablet (10 mg total) by mouth every 6 (six) hours as needed (Nausea or vomiting). 03/20/20  Yes Lloyd Huger, MD  diltiazem (CARDIZEM CD) 120 MG 24 hr capsule Take 120 mg by mouth daily. 09/26/19 10/29/19  [provider]    Physical Exam: Vitals:   06/20/20 2342 06/20/20 2343 06/21/20 0419 06/21/20 0647  BP: (!) 177/90  (!) 180/92 (!) 193/97  Pulse: (!) 101  (!) 103 100  Resp: 20  20 20   Temp: 98.6 F (37 C)     TempSrc: Oral     SpO2: 99%  98% 100%  Weight:  56.7 kg    Height:  5\' 3"  (1.6 m)       Vitals:   06/20/20 2342 06/20/20 2343 06/21/20 0419 06/21/20 0647  BP: (!) 177/90  (!) 180/92 (!) 193/97  Pulse: (!) 101  (!) 103 100  Resp: 20  20 20   Temp: 98.6 F (37 C)     TempSrc: Oral     SpO2: 99%  98% 100%  Weight:  56.7 kg    Height:  5\' 3"  (1.6 m)      Constitutional: NAD, alert and oriented x 3.  Chronically ill-appearing Eyes: PERRL, lids and conjunctivae pallor ENMT: Mucous membranes are dry.  Neck: normal,  supple, no masses, no thyromegaly Respiratory: clear to auscultation bilaterally, no wheezing, no crackles. Normal respiratory effort. No accessory muscle use.  Cardiovascular: Regular rate and rhythm, no murmurs / rubs / gallops. No extremity edema. 2+ pedal pulses. No carotid bruits.  Abdomen: no tenderness, no masses palpated. No hepatosplenomegaly. Bowel sounds positive.  PD catheter in place Musculoskeletal: no clubbing / cyanosis. No joint deformity upper and lower extremities.  Skin: no rashes, lesions, ulcers.  Neurologic: No gross focal neurologic deficit.  Generalized weakness Psychiatric: Normal mood and affect.   Labs on Admission: I have personally reviewed following labs and imaging studies  CBC: Recent Labs  Lab 06/15/20 0841 06/18/20 0943 06/21/20 0000  WBC 4.8 6.0 7.2  NEUTROABS 4.2 5.5 6.8  HGB 6.6* 8.6* 9.7*  HCT 18.9* 23.8* 26.6*  MCV 89.6 88.1 90.5  PLT 108* 131* 929   Basic Metabolic Panel: Recent Labs  Lab 06/15/20 0841 06/18/20 0945 06/21/20 0000 06/21/20 0502  NA 124* 125* 129*  --   K 2.3* 2.6* 2.9*  --   CL 86* 86* 90*  --   CO2 24 21* 20*  --   GLUCOSE 83 69* 74  --   BUN 49* 67* 88*  --   CREATININE 13.59* 16.79* 18.12*  --   CALCIUM 5.9* 6.1* 6.6*  --   MG  --   --   --  1.6*   GFR: Estimated Creatinine Clearance: 2.7 mL/min (A) (by C-G formula based on SCr of 18.12 mg/dL (H)). Liver Function Tests: Recent Labs  Lab 06/15/20 0841 06/18/20 0945  AST 11* 10*  ALT 5 <5  ALKPHOS 53 51  BILITOT 0.7 0.9  PROT 5.9* 6.2*  ALBUMIN 2.1* 2.3*   Recent Labs  Lab 06/21/20 0000  LIPASE 46   No results for input(s): AMMONIA in the last 168 hours. Coagulation Profile: No results for input(s): INR, PROTIME in the last 168 hours. Cardiac Enzymes: No results for input(s): CKTOTAL, CKMB, CKMBINDEX, TROPONINI in the last 168 hours.  BNP (last 3 results) No results for input(s): PROBNP in the last 8760 hours. HbA1C: No results for input(s):  HGBA1C in the last 72 hours. CBG: No results for input(s): GLUCAP in the last 168 hours. Lipid Profile: No results for input(s): CHOL, HDL, LDLCALC, TRIG, CHOLHDL, LDLDIRECT in the last 72 hours. Thyroid Function Tests: No results for input(s): TSH, T4TOTAL, FREET4, T3FREE, THYROIDAB in the last 72 hours. Anemia Panel: No results for input(s): VITAMINB12, FOLATE, FERRITIN, TIBC, IRON, RETICCTPCT in the last 72 hours. Urine analysis:    Component Value Date/Time   COLORURINE YELLOW (A) 10/31/2017 1959   APPEARANCEUR HAZY (A) 10/31/2017 1959   APPEARANCEUR Hazy 12/09/2013 1046   LABSPEC 1.017 10/31/2017 1959   LABSPEC 1.031 12/09/2013 1046   PHURINE 6.0 10/31/2017 1959   GLUCOSEU >=500 (A) 10/31/2017 1959   GLUCOSEU >=500 12/09/2013 1046   [place] 10/31/2017 [place] NEGATIVE 10/31/2017 1959   BILIRUBINUR Negative 12/09/2013 1046   [place] 10/31/2017 1959   PROTEINUR >=300 (A) 10/31/2017 1959   NITRITE NEGATIVE 10/31/2017 1959   LEUKOCYTESUR NEGATIVE 10/31/2017 1959   LEUKOCYTESUR Negative 12/09/2013 1046    Radiological Exams on Admission: No results found.  EKG: Independently reviewed.  Sinus rhythm with occasional PVCs and LVH  Assessment/Plan Principal Problem:   Intractable vomiting Active Problems:   ESRD (end stage renal disease) (HCC)   Generalized weakness   Cervical cancer (HCC)   Hypokalemia   Hyponatremia   Hypomagnesemia      Intractable nausea and vomiting Most likely secondary to recent chemotherapy that patient received (Cisplatin) She has completed chemotherapy for cervical cancer Supportive care with antiemetics, IV fluid hydration and IV PPI   End-stage renal disease Patient on peritoneal dialysis We will request nephrology consult   Multiple electrolyte abnormalities Patient noted to have hypokalemia, hypomagnesemia and hyponatremia secondary to GI losses from nausea, vomiting and diarrhea Supplement  electrolytes   Cervical cancer Patient is status post chemoradiation therapy and will be referred to [facility] for brachytherapy Follow-up with oncology as an outpatient   Hypertension Uncontrolled because patient is unable to take her medications due to persistent nausea and vomiting Resume carvedilol and diltiazem IV hydralazine for systolic blood pressure greater than 158mmHg    Generalized weakness Secondary to multiple electrolyte abnormalities as well as refractory nausea and vomiting Patient may need physical therapy evaluation   Anemia of chronic kidney disease Continue Epogen    DVT prophylaxis: SCD.  Patient is allergic to heparin Code Status: Full code Family Communication: Greater than 50% of time was spent discussing plan of care with patient and her husband at the bedside.  All questions and concerns have been addressed.  They verbalized understanding and agree with the plan. Disposition Plan: Back to previous home environment Consults called: Nephrology    Abigayl Hor MD [facility]     06/21/2020, 8:14 AM

## 2020-06-21 NOTE — ED Notes (Signed)
Pt states coming in for nausea, vomiting and diarrhea. Pt states some nausea right now with a "gurgling stomach" tech helped pt to bedside commode.

## 2020-06-21 NOTE — ED Notes (Signed)
Patient transported to CT 

## 2020-06-21 NOTE — ED Notes (Addendum)
Family at bedside Pt sleeping - No vomiting since this nurse arrival at 7am

## 2020-06-21 NOTE — ED Notes (Signed)
Report given to [facility]

## 2020-06-21 NOTE — ED Provider Notes (Signed)
[facility] Emergency Department Provider Note  ____________________________________________   First MD Initiated Contact with Patient 06/21/20 [phone]     (approximate)  I have reviewed the triage vital signs and the nursing notes.   HISTORY  Chief Complaint Generalized Body Aches    HPI Tracy Jennings is a 64 y.o. female with medical history as listed below which notably includes end-stage renal disease with peritoneal dialysis and cervical cancer currently undergoing chemotherapy and radiation treatment.  She presents tonight for generalized weakness, vomiting, and diarrhea.  She states that since she had radiation on Friday she has not been able to stop vomiting or having diarrhea.  She has not been able to eat or drink anything and feels generalized weakness to the point that she has not been able to get up or do very much of anything including draining her peritoneal fluid.  She has some generalized aching abdominal pain but that is normal for her.  She denies fever/chills, sore throat, chest pain, shortness of breath.  The vomiting and the diarrhea are severe and nothing in particular makes it better and both symptoms are worse if she tries to eat or drink anything.         Past Medical History:  Diagnosis Date  . Anemia 04/2018   low iron. to be started on supplements  . Cervical cancer ([place])   . CKD (chronic kidney disease)    Stage IV  . Complication of anesthesia    receceived too much anesthesia, that she was in coma for a couple days   . COVID-19 virus detected 10/28/2019  . Diabetes mellitus without complication ([place])    type II  . ESRD (end stage renal disease) ([place])   . Heart murmur    followed as a child only  . HSIL (high grade squamous intraepithelial lesion) on Pap smear of cervix   . Hyperlipidemia associated with type 2 diabetes mellitus ([place])   . Hypertension   . Pancreatitis   . Peripheral vascular disease [facility])      Patient Active Problem List   Diagnosis Date Noted  . Intractable vomiting 06/21/2020  . 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 Goals of care, counseling/discussion 03/20/2020  . Cervical cancer ([place]) 03/11/2020  . Orthostatic hypotension   . Syncope 11/04/2019  . 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 Type 2 diabetes mellitus with other specified complication ([place]) 70/26/3785  . Peritoneal dialysis status ([place]) 11/03/2019  . Unable to care for self 11/03/2019  . Supine hypertension 11/03/2019  . Metabolic acidosis, increased anion gap (I[facility])   . COVID-19   . Generalized weakness   . Recurrent syncope 10/28/2019  . 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 Complication of vascular access for dialysis 05/11/2018  . Respiratory arrest ([place]) 05/11/2018  . Hyperlipidemia 04/12/2018  . Pancreatitis, recurrent 11/01/2017  . HTN (hypertension) 11/01/2017  . ESRD (end stage renal disease) ([place]) 11/01/2017  . Recurrent pancreatitis 11/01/2017    Past Surgical History:  Procedure Laterality Date  . AMPUTATION TOE Left 2013   2nd toe. tip of toe (toe nail was infected)  . AV FISTULA PLACEMENT Left 05/11/2018   Procedure: ARTERIOVENOUS (AV) FISTULA CREATION;  Surgeon: Katha Cabal, MD;  Location: [facility] ORS;  Service: Vascular;  Laterality: Left;  . CATARACT EXTRACTION    . CERVICAL CONIZATION W/BX N/A 04/08/2020   Procedure: CONIZATION CERVIX WITH BIOPSY;  Surgeon: Mellody Drown, MD;  Location: [facility] ORS;  Service: Gynecology;  Laterality: N/A;  . CHOLECYSTECTOMY  2014  . COLONOSCOPY    . COLONOSCOPY WITH PROPOFOL N/A 01/10/2018   Procedure: COLONOSCOPY WITH PROPOFOL;  Surgeon: Toledo, Benay Pike, MD;  Location: [facility] ENDOSCOPY;  Service: Gastroenterology;  Laterality: N/A;  . DIALYSIS/PERMA CATHETER INSERTION N/A 05/21/2018   Procedure: DIALYSIS/PERMA CATHETER INSERTION;  Surgeon: Algernon Huxley, MD;  Location: [place] CV LAB;   Service: Cardiovascular;  Laterality: N/A;  . DIALYSIS/PERMA CATHETER REMOVAL N/A 04/11/2019   Procedure: DIALYSIS/PERMA CATHETER REMOVAL;  Surgeon: Algernon Huxley, MD;  Location: [place] [facility];  Service: Cardiovascular;  Laterality: N/A;  . EYE SURGERY Bilateral 2018   cataract extractions  . IR IMAGING GUIDED PORT INSERTION  06/05/2020  . THROMBECTOMY W/ EMBOLECTOMY  05/11/2018   Procedure: THROMBECTOMY ARTERIOVENOUS FISTULA;  Surgeon: Katha Cabal, MD;  Location: [facility] ORS;  Service: Vascular;;  . TUBAL LIGATION  1984    Prior to Admission medications   Medication Sig Start Date End Date Taking? Authorizing Provider  calcitRIOL (ROCALTROL) 0.25 MCG capsule Take 0.25 mcg by mouth daily. 09/26/19   [provider]  calcium acetate (PHOSLO) 667 MG capsule Take 1,334 mg by mouth 2 (two) times daily with a meal.  08/20/19   [provider]  carvedilol (COREG) 25 MG tablet Take 25 mg by mouth 2 (two) times daily with a meal.     [provider]  clopidogrel (PLAVIX) 75 MG tablet Take 75 mg by mouth daily.     [provider]  DILT-XR 180 MG 24 hr capsule Take 180 mg by mouth daily. 05/01/20   [provider]  diltiazem (DILACOR XR) 120 MG 24 hr capsule Take 120 mg by mouth daily.     [provider]  diphenoxylate-atropine (LOMOTIL) 2.5-0.025 MG tablet Take 1 tablet by mouth 4 (four) times daily as needed for diarrhea or loose stools. 06/08/20   Lloyd Huger, MD  lidocaine-prilocaine (EMLA) cream Apply 1 application topically as needed. Apply to port 1-2 hours prior to chemotherapy, cover with plastic wrap. 06/05/20   Lloyd Huger, MD  loperamide (IMODIUM A-D) 2 MG tablet Take 2 mg by mouth 4 (four) times daily as needed for diarrhea or loose stools.    [provider]  Potassium Chloride ER 20 MEQ TBCR Take 1 tablet by mouth daily.  05/12/20   [provider]  prochlorperazine (COMPAZINE) 10 MG tablet Take 1  tablet (10 mg total) by mouth every 6 (six) hours as needed (Nausea or vomiting). 03/20/20   Lloyd Huger, MD  diltiazem (CARDIZEM CD) 120 MG 24 hr capsule Take 120 mg by mouth daily. 09/26/19 10/29/19  [provider]    Allergies Amlodipine, Hctz [hydrochlorothiazide], Heparin, Lmw heparin, Other, Sulfa antibiotics, Dairy aid [lactase], and Hydralazine  Family History  Problem Relation Age of Onset  . Stroke Mother   . Hypertension Mother   . Gout Mother   . Cancer Father   . Diabetes Sister   . Diabetes Maternal Grandmother   . Diabetes Maternal Grandfather   . Diabetes Paternal Grandmother   . Diabetes Paternal Grandfather     Social History Social History   Tobacco Use  . Smoking status: Never Smoker  . Smokeless tobacco: Never Used  Vaping Use  . Vaping Use: Never used  Substance Use Topics  . Alcohol use: No  . Drug use: Never    Review of Systems Constitutional: No fever/chills.  Generalized weakness and general malaise. Eyes: No  visual changes. ENT: No sore throat. Cardiovascular: Denies chest pain. Respiratory: Denies shortness of breath. Gastrointestinal: Mild generalized abdominal pain.  Severe nausea/vomiting and diarrhea for several days. Genitourinary: Negative for dysuria. Musculoskeletal: Negative for neck pain.  Negative for back pain. Integumentary: Negative for rash. Neurological: Negative for headaches, focal weakness or numbness.   ____________________________________________   PHYSICAL EXAM:  VITAL SIGNS: ED Triage Vitals  Enc Vitals Group     BP 06/20/20 2342 (!) 177/90     Pulse Rate 06/20/20 2342 (!) 101     Resp 06/20/20 2342 20     Temp 06/20/20 2342 98.6 F (37 C)     Temp Source 06/20/20 2342 Oral     SpO2 06/20/20 2342 99 %     Weight 06/20/20 2343 56.7 kg (125 lb)     Height 06/20/20 2343 1.6 m (5\' 3" )     Head Circumference --      Peak Flow --      Pain Score 06/20/20 2343 8     Pain Loc --      Pain Edu?  --      Excl. in [place]? --     Constitutional: Alert and oriented.  Appears chronically ill and uncomfortable at this time. Eyes: Conjunctivae are normal.  Head: Atraumatic. Nose: No congestion/rhinnorhea. Mouth/Throat: Patient is wearing a mask. Neck: No stridor.  No meningeal signs.   Cardiovascular: Tachycardia, regular rhythm. Good peripheral circulation. Grossly normal heart sounds. Respiratory: Normal respiratory effort.  No retractions. Gastrointestinal: Peritoneal dialysis catheter is present.  Abdomen is minimally distended.  She has no tenderness to palpation of the abdomen but pressing on her abdomen makes her have severe bowel urgency. Musculoskeletal: No lower extremity tenderness nor edema. No gross deformities of extremities. Neurologic:  Normal speech and language. No gross focal neurologic deficits are appreciated.  Skin:  Skin is warm, dry and intact. Psychiatric: Mood and affect are normal. Speech and behavior are normal.  ____________________________________________   LABS (all labs ordered are listed, but only abnormal results are displayed)  Labs Reviewed  CBC WITH DIFFERENTIAL/PLATELET - Abnormal; Notable for the following components:      Result Value   RBC 2.94 (*)    Hemoglobin 9.7 (*)    HCT 26.6 (*)    MCHC 36.5 (*)    Lymphs Abs 0.1 (*)    All other components within normal limits  BASIC METABOLIC PANEL - Abnormal; Notable for the following components:   Sodium 129 (*)    Potassium 2.9 (*)    Chloride 90 (*)    CO2 20 (*)    BUN 88 (*)    Creatinine, Ser 18.12 (*)    Calcium 6.6 (*)    GFR, Estimated 2 (*)    Anion gap 19 (*)    All other components within normal limits  MAGNESIUM - Abnormal; Notable for the following components:   Magnesium 1.6 (*)    All other components within normal limits  RESPIRATORY PANEL BY RT PCR (FLU A&B, COVID)  LIPASE, BLOOD   ____________________________________________  EKG  ED ECG REPORT I, Hinda Kehr,  the attending physician, personally viewed and interpreted this ECG.  Date: 06/21/2020 EKG Time: 6:12 AM Rate: 97 Rhythm: normal sinus rhythm with occasional PVC, LVH QRS Axis: Left axis deviation Intervals: Slightly prolonged QTC at 502 ms ST/T Wave abnormalities: Non-specific ST segment / T-wave changes, but no clear evidence of acute ischemia. Narrative Interpretation: no definitive evidence of acute ischemia; does not  meet STEMI criteria.   ____________________________________________  RADIOLOGY I, Hinda Kehr, personally viewed and evaluated these images (plain radiographs) as part of my medical decision making, as well as reviewing the written report by the radiologist.  ED MD interpretation: CT of the abdomen and pelvis without contrast is pending at the time of admission.  Official radiology report(s): No results found.  ____________________________________________   PROCEDURES   Procedure(s) performed (including Critical Care):  Procedures   ____________________________________________   INITIAL IMPRESSION / MDM / [place] / ED COURSE  As part of my medical decision making, I reviewed the following data within the [place] notes reviewed and incorporated, Labs reviewed , EKG interpreted , Old EKG reviewed, Old chart reviewed, Discussed with admitting physician (Dr. Damita Dunnings) and Notes from prior ED visits   Differential diagnosis includes, but is not limited to, vomiting and diarrhea associated with radiation treatments, electrolyte or metabolic abnormality, SBP, other nonspecific infection, SBO/ileus.  The patient's main complaint is diarrhea followed closely by intractable nausea and vomiting.  She received 2 doses of Zofran while she awaited a room for ED evaluation and it is not helping.  I am awaiting an EKG to make sure she does not have a prolonged QTc interval and then I will try a dose of droperidol 2.5 mg IV to see if  this helps with her intractable nausea vomiting.    Her labs are notable for no leukocytosis, normal lipase, chronically decreased potassium of 2.9, hypomagnesemia 1.6, and chronically low calcium at 6.6.  I looked in her medical record and she had an infusion of 2 g of calcium gluconate as well as some Decadron few days ago as opposed to cisplatin and just prior to her radiation treatment.  Given her inability to tolerate p.o., her generalized weakness to the point of being unable even to manage her peritoneal dialysis, her intractable nausea and vomiting as well as the diarrhea, I am obtaining a CT of the abdomen pelvis without contrast to look for any gross abnormalities, evidence of acute infection, or evidence of obstruction, but I anticipate admission for further management of her electrolyte abnormalities and GI symptoms.  The patient is on the cardiac monitor to evaluate for evidence of arrhythmia and/or significant heart rate changes.     Clinical Course as of Jun 21 648  Sun Jun 21, 2020  3536 For her volume depletion I am also giving her 500 mL of normal saline, but given all the other fluid she will get as result of magnesium 2 g IV, calcium gluconate 2 g IV, and potassium 10 mEq IV, I will hold off on additional crystalloid   [CF]  0609 For patient's abdominal pain (and diarrhea), I am giving fentanyl 50 mcg IV (better than morphine while on peritoneal dialysis).  Will give haloperidol 1 mg IV for intractable vomiting   [CF]  [phone] Discussed case by phone with Dr. Damita Dunnings of the hospitalist service who will admit   [CF]    Clinical Course User Index [CF] Hinda Kehr, MD     ____________________________________________  FINAL CLINICAL IMPRESSION(S) / ED DIAGNOSES  Final diagnoses:  Intractable nausea and vomiting  Intractable diarrhea  Generalized weakness  ESRD on peritoneal dialysis (HCC)  Chronic abdominal pain  Hypocalcemia  Hypokalemia  Hypomagnesemia      MEDICATIONS GIVEN DURING THIS VISIT:  Medications  magnesium sulfate IVPB 2 g 50 mL (has no administration in time range)  potassium chloride 10 mEq in 100  mL IVPB (has no administration in time range)  calcium gluconate 2 g/ 100 mL sodium chloride IVPB (has no administration in time range)  oxyCODONE-acetaminophen (PERCOCET/ROXICET) 5-325 MG per tablet 1 tablet (1 tablet Oral Given 06/20/20 2351)  ondansetron (ZOFRAN-ODT) disintegrating tablet 4 mg (4 mg Oral Given 06/20/20 2350)  ondansetron (ZOFRAN-ODT) disintegrating tablet 4 mg (4 mg Oral Given 06/21/20 0419)  sodium chloride 0.9 % bolus 500 mL (500 mLs Intravenous New Bag/Given 06/21/20 0641)  haloperidol lactate (HALDOL) injection 1 mg (1 mg Intravenous Given 06/21/20 0642)  fentaNYL (SUBLIMAZE) injection 50 mcg (50 mcg Intravenous Given 06/21/20 0644)     ED Discharge Orders    None      *Please note:  RUDIE RIKARD was evaluated in Emergency Department on 06/21/2020 for the symptoms described in the history of present illness. She was evaluated in the context of the global COVID-19 pandemic, which necessitated consideration that the patient might be at risk for infection with the SARS-CoV-2 virus that causes COVID-19. Institutional protocols and algorithms that pertain to the evaluation of patients at risk for COVID-19 are in a state of rapid change based on information released by regulatory bodies including the CDC and federal and state organizations. These policies and algorithms were followed during the patient's care in the ED.  Some ED evaluations and interventions may be delayed as a result of limited staffing during and after the pandemic.*  Note:  This document was prepared using Dragon voice recognition software and may include unintentional dictation errors.   Hinda Kehr, MD 06/21/20 [phone]

## 2020-06-21 NOTE — Progress Notes (Signed)
[facility]  ROUNDING NOTE   Subjective:   Tracy Jennings was admitted to [facility] on 06/21/2020 for Intractable vomiting [R11.10] Intractable nausea and vomiting [R11.2]  Family at bedside.   Patient did not get peritoneal dialysis last night.     Objective:  Vital signs in last 24 hours:  Temp:  [98.6 F (37 C)] 98.6 F (37 C) (10/09 2342) Pulse Rate:  [100-103] 100 (10/10 0647) Resp:  [20] 20 (10/10 0647) BP: (177-193)/(90-97) 193/97 (10/10 0647) SpO2:  [98 %-100 %] 100 % (10/10 0647) Weight:  [56.7 kg] 56.7 kg (10/09 2343)  Weight change:  Filed Weights   06/20/20 2343  Weight: 56.7 kg    Intake/Output: No intake/output data recorded.   Intake/Output this shift:  Total I/O In: 600 [IV Piggyback:600] Out: -   Physical Exam: General: NAD,   Head: Normocephalic, atraumatic. Moist oral mucosal membranes  Eyes: Anicteric, PERRL  Neck: Supple, trachea midline  Lungs:  Clear to auscultation  Heart: Regular rate and rhythm  Abdomen:  Soft, nontender,   Extremities: no peripheral edema.  Neurologic: Nonfocal, moving all four extremities  Skin: No lesions  Access: PD catheter    Basic Metabolic Panel: Recent Labs  Lab 06/15/20 0841 06/18/20 0945 06/21/20 0000 06/21/20 0502  NA 124* 125* 129*  --   K 2.3* 2.6* 2.9*  --   CL 86* 86* 90*  --   CO2 24 21* 20*  --   GLUCOSE 83 69* 74  --   BUN 49* 67* 88*  --   CREATININE 13.59* 16.79* 18.12*  --   CALCIUM 5.9* 6.1* 6.6*  --   MG  --   --   --  1.6*    Liver Function Tests: Recent Labs  Lab 06/15/20 0841 06/18/20 0945  AST 11* 10*  ALT 5 <5  ALKPHOS 53 51  BILITOT 0.7 0.9  PROT 5.9* 6.2*  ALBUMIN 2.1* 2.3*   Recent Labs  Lab 06/21/20 0000  LIPASE 46   No results for input(s): AMMONIA in the last 168 hours.  CBC: Recent Labs  Lab 06/15/20 0841 06/18/20 0943 06/21/20 0000  WBC 4.8 6.0 7.2  NEUTROABS 4.2 5.5 6.8  HGB 6.6* 8.6* 9.7*  HCT 18.9* 23.8* 26.6*  MCV 89.6  88.1 90.5  PLT 108* 131* 164    Cardiac Enzymes: No results for input(s): CKTOTAL, CKMB, CKMBINDEX, TROPONINI in the last 168 hours.  BNP: Invalid input(s): POCBNP  CBG: No results for input(s): GLUCAP in the last 168 hours.  Microbiology: Results for orders placed or performed during the hospital encounter of 04/06/20  SARS CORONAVIRUS 2 (TAT 6-24 HRS) Nasopharyngeal Nasopharyngeal Swab     Status: None   Collection Time: 04/06/20  8:51 AM   Specimen: Nasopharyngeal Swab  Result Value Ref Range Status   SARS Coronavirus 2 NEGATIVE NEGATIVE Final    Comment: (NOTE) SARS-CoV-2 target nucleic acids are NOT DETECTED.  The SARS-CoV-2 RNA is generally detectable in upper and lower respiratory specimens during the acute phase of infection. Negative results do not preclude SARS-CoV-2 infection, do not rule out co-infections with other pathogens, and should not be used as the sole basis for treatment or other patient management decisions. Negative results must be combined with clinical observations, patient history, and epidemiological information. The expected result is Negative.  Fact Sheet for Patients: SugarRoll.be  Fact Sheet for Healthcare Providers: https://www.woods-mathews.com/  This test is not yet approved or cleared by the [place] FDA and  has been authorized for detection and/or diagnosis of SARS-CoV-2 by FDA under an Emergency Use Authorization (EUA). This EUA will remain  in effect (meaning this test can be used) for the duration of the COVID-19 declaration under Se ction 564(b)(1) of the Act, 21 U.S.C. section 360bbb-3(b)(1), unless the authorization is terminated or revoked sooner.  Performed at [facility], [place] [address] [place], [place] [address]     Coagulation Studies: No results for input(s): LABPROT, INR in the last 72 hours.  Urinalysis: No results for input(s): COLORURINE, LABSPEC, PHURINE,  GLUCOSEU, HGBUR, BILIRUBINUR, KETONESUR, PROTEINUR, UROBILINOGEN, NITRITE, LEUKOCYTESUR in the last 72 hours.  Invalid input(s): APPERANCEUR    Imaging: No results found.   Medications:   . calcium gluconate    . dextrose 5 % and 0.9 % NaCl with KCl 40 mEq/L    . magnesium sulfate bolus IVPB 2 g (06/21/20 0815)   . calcitRIOL  0.25 mcg Oral Daily  . calcium acetate  1,334 mg Oral BID WC  . carvedilol  25 mg Oral BID WC  . clopidogrel  75 mg Oral Daily  . diltiazem  180 mg Oral Daily   acetaminophen **OR** acetaminophen, diphenoxylate-atropine, ondansetron **OR** ondansetron (ZOFRAN) IV  Assessment/ Plan:  Tracy Jennings is a 64 y.o. black female with end stage renal disease on peritoneal dialysis, cervical cancer, hypertension, diabetes mellitus type II, peripheral vascular disease, hyperlipidemia, who is [facility] to [facility] on 06/21/2020 for Intractable vomiting [R11.10] Intractable nausea and vomiting [R11.2]  [facility] Shanon Payor 66kg CCPD 9 hours, 4 exchanges, 2592mL fills with last fill of 2028mL with icodextran.   1. End Stage Renal Disease on peritoneal dialysis. Missed treatment last night  - Schedule peritoneal dialysis for tonight as long as patient gets a inpatient room.   2. Hypertension: elevated. Home regimen of carvedilol and diltiazem. Restart PO medications if patient is able to tolerate PO.  - Start IV labetalol PRN. Patient has an allergy to hydralazine and amlodipine.   3. Hypokalemia: with GI losses. On oral potassium supplementation at home.  - IV Potassium chloride.    4. Hyponatremia: secondary to GI losses and kidney failure. Hypovolemic on examination.  - IV saline infusion  5. Anemia with chronic kidney disease: hemoglobin 9.7. EPO as outpatient.   7. Secondary Hyperparathyroidism with hypocalcemia:  - calcitriol and calcium acetate.    LOS: 0 Dacen Frayre 10/10/20218:21 AM

## 2020-06-22 DIAGNOSIS — N186 End stage renal disease: Secondary | ICD-10-CM | POA: Diagnosis not present

## 2020-06-22 DIAGNOSIS — C539 Malignant neoplasm of cervix uteri, unspecified: Secondary | ICD-10-CM | POA: Diagnosis not present

## 2020-06-22 DIAGNOSIS — E876 Hypokalemia: Secondary | ICD-10-CM

## 2020-06-22 DIAGNOSIS — R112 Nausea with vomiting, unspecified: Secondary | ICD-10-CM | POA: Diagnosis not present

## 2020-06-22 LAB — BASIC METABOLIC PANEL
Anion gap: 12 (ref 5–15)
BUN: 90 mg/dL — ABNORMAL HIGH (ref 8–23)
CO2: 25 mmol/L (ref 22–32)
Calcium: 7 mg/dL — ABNORMAL LOW (ref 8.9–10.3)
Chloride: 93 mmol/L — ABNORMAL LOW (ref 98–111)
Creatinine, Ser: 17.15 mg/dL — ABNORMAL HIGH (ref 0.44–1.00)
GFR, Estimated: 2 mL/min — ABNORMAL LOW (ref >60)
Glucose, Bld: 117 mg/dL — ABNORMAL HIGH (ref 70–99)
Potassium: 2.8 mmol/L — ABNORMAL LOW (ref 3.5–5.1)
Sodium: 130 mmol/L — ABNORMAL LOW (ref 135–145)

## 2020-06-22 LAB — HEMOGLOBIN AND HEMATOCRIT, BLOOD
HCT: 18.5 % — ABNORMAL LOW (ref 36.0–46.0)
Hemoglobin: 6.5 g/dL — ABNORMAL LOW (ref 12.0–15.0)

## 2020-06-22 LAB — GLUCOSE, CAPILLARY
Glucose-Capillary: 100 mg/dL — ABNORMAL HIGH (ref 70–99)
Glucose-Capillary: 121 mg/dL — ABNORMAL HIGH (ref 70–99)
Glucose-Capillary: 114 mg/dL — ABNORMAL HIGH (ref 70–99)

## 2020-06-22 LAB — C DIFFICILE QUICK SCREEN W PCR REFLEX
C Diff antigen: NEGATIVE
C Diff interpretation: NOT DETECTED
C Diff toxin: NEGATIVE

## 2020-06-22 LAB — MAGNESIUM: Magnesium: 2 mg/dL (ref 1.7–2.4)

## 2020-06-22 LAB — CBC
HCT: 20.3 % — ABNORMAL LOW (ref 36.0–46.0)
Hemoglobin: 7.2 g/dL — ABNORMAL LOW (ref 12.0–15.0)
MCH: 31.9 pg (ref 26.0–34.0)
MCHC: 35.5 g/dL (ref 30.0–36.0)
MCV: 89.8 fL (ref 80.0–100.0)
Platelets: 130 10*3/uL — ABNORMAL LOW (ref 150–400)
RBC: 2.26 MIL/uL — ABNORMAL LOW (ref 3.87–5.11)
RDW: 14.3 % (ref 11.5–15.5)
WBC: 7 10*3/uL (ref 4.0–10.5)
nRBC: 0 % (ref 0.0–0.2)

## 2020-06-22 LAB — OCCULT BLOOD X 1 CARD TO LAB, STOOL: Fecal Occult Bld: POSITIVE — AB

## 2020-06-22 MED ORDER — PROMETHAZINE HCL 25 MG/ML IJ SOLN
12.5000 mg | Freq: Once | INTRAMUSCULAR | Status: AC
  Administered 2020-06-22: 12.5 mg via INTRAVENOUS
  Filled 2020-06-22: qty 1

## 2020-06-22 MED ORDER — RENA-VITE PO TABS
1.0000 | ORAL_TABLET | Freq: Every day | ORAL | Status: DC
  Administered 2020-06-29 – 2020-07-01 (×3): 1 via ORAL
  Filled 2020-06-22 (×6): qty 1

## 2020-06-22 MED ORDER — POTASSIUM CHLORIDE IN NACL 40-0.9 MEQ/L-% IV SOLN
INTRAVENOUS | Status: DC
  Filled 2020-06-22 (×2): qty 1000

## 2020-06-22 MED ORDER — CHLORHEXIDINE GLUCONATE CLOTH 2 % EX PADS
6.0000 | MEDICATED_PAD | Freq: Every day | CUTANEOUS | Status: DC
  Administered 2020-06-23 – 2020-07-01 (×6): 6 via TOPICAL

## 2020-06-22 MED ORDER — PANTOPRAZOLE SODIUM 40 MG IV SOLR
40.0000 mg | Freq: Every day | INTRAVENOUS | Status: DC
  Administered 2020-06-22: 40 mg via INTRAVENOUS
  Filled 2020-06-22: qty 40

## 2020-06-22 MED ORDER — PANTOPRAZOLE SODIUM 40 MG IV SOLR
40.0000 mg | Freq: Two times a day (BID) | INTRAVENOUS | Status: DC
  Administered 2020-06-22 – 2020-07-01 (×17): 40 mg via INTRAVENOUS
  Filled 2020-06-22 (×17): qty 40

## 2020-06-22 MED ORDER — SODIUM CHLORIDE 0.9% IV SOLUTION: Freq: Once | INTRAVENOUS | Status: AC

## 2020-06-22 MED ORDER — POTASSIUM CHLORIDE CRYS ER 20 MEQ PO TBCR
40.0000 meq | EXTENDED_RELEASE_TABLET | Freq: Once | ORAL | Status: DC
  Filled 2020-06-22: qty 2

## 2020-06-22 MED ORDER — POTASSIUM CHLORIDE 10 MEQ/100ML IV SOLN
10.0000 meq | INTRAVENOUS | Status: AC
  Administered 2020-06-22 (×4): 10 meq via INTRAVENOUS
  Filled 2020-06-22 (×4): qty 100

## 2020-06-22 MED ORDER — BOOST / RESOURCE BREEZE PO LIQD CUSTOM
1.0000 | Freq: Three times a day (TID) | ORAL | Status: DC
  Administered 2020-06-24 – 2020-06-25 (×2): 1 via ORAL

## 2020-06-22 MED ORDER — DELFLEX-LC/2.5% DEXTROSE 394 MOSM/L IP SOLN
INTRAPERITONEAL | Status: DC
  Filled 2020-06-22 (×10): qty 3000

## 2020-06-22 MED ORDER — GENTAMICIN SULFATE 0.1 % EX CREA
1.0000 "application " | TOPICAL_CREAM | Freq: Every day | CUTANEOUS | Status: DC
  Administered 2020-06-23 – 2020-06-30 (×5): 1 via TOPICAL
  Filled 2020-06-22: qty 15

## 2020-06-22 NOTE — Progress Notes (Addendum)
2041- Attempted to troubleshoot peritoneal dialysis machine by calling baxter 1800 number on the machine. Representative walked me through a number of things to troubleshoot the machine and fixing the issue. After multiple unsuccessful attempts, the representative informed me to clamp the tubing closest to the pt, shut off the machine and call the on call dialysis nurse.    2102Roland Earl, RN to inform her of the problem I encountered with the machine saying " check patient line." Kristi informed me she will contact Dr. Juleen China and kept the machine off for tonight.   2106- Kristi called back to inform writer she spoke with Dr. Juleen China. Dr Juleen China agreed with the interventions by clamping the tubing and turning off the machine. And told her she will see the pt in the am. Will continue to monitor.

## 2020-06-22 NOTE — Progress Notes (Signed)
PROGRESS NOTE    Tracy Jennings   KGM:[id number]  DOB: [birth year]  PCP: Sharyne Peach, MD    DOA: 06/21/2020 LOS: 1   Brief Narrative   Tracy Jennings is a 64 y.o. female with medical history significant ESRD on peritoneal dialysis, history of cervical cancer (completed chemo and radiation therapy) and is scheduled to follow-up at [facility] for brachytherapy due to persistent disease and hypertension.  She presented to the ED on evening of 06/20/20 with a 1 week history of nausea, vomiting and diarrhea, inability to tolerate oral intake, generalized abdominal pain (pt states is chronic) and generalized weakness.    In the ED, sodium of 129, potassium 2.9, chloride of 90, bicarb 20, glucose 75, BUN 88, creatinine 18.12, calcium 6.6, magnesium 1.6, white count 7.2, hemoglobin 9.7, hematocrit 26.6, MCV 90.5, RDW 14.6, platelet count 164.       Assessment & Plan   Principal Problem:   Intractable vomiting Active Problems:   ESRD (end stage renal disease) (HCC)   Generalized weakness   Cervical cancer (HCC)   Hypokalemia   Hyponatremia   Hypomagnesemia   Intractable nausea and vomiting   Refractory nausea and vomiting   Intractable nausea and vomiting - most likely due to recent chemo (Cisplatin) for cervical cancer.   --Continue IV hydration --PRN IV antiemetics --IV Protonix daily --Clear liquid diet for now, advance as tolerated --Monitor electrolytes and replace as needed   Diarrhea - present on admission, may also be due to chemo.   --Check C diff --FOBT given drop in Hgb   ESRD on peritoneal dialysis - nephrology consulted for PD.  Overnight 10/10-11 PD malfunction and had to stop treatment early.     Hypokalemia / Hypomagnesemia / Hyponatremia - secondary to GI losses from nausea, vomiting and diarrhea.  --Monitor BMP and Mg --IV fluids, replace K and Mg as needed   Cervical cancer - no acute issues.  Pt is s/p chemoradiation therapy and will be  referred to [facility] for brachytherapy.  --Follow-up with oncology as an outpatient   Hypertension - chronic, uncontrolled on admission, suspect due to pain and unable to take her meds with N/V. --Continue home Coreg, Cardizem --PRN IV hydralazine    Generalized weakness - multifactorial due to electrolyte derangements, poor oral intake, malignancy. Will consider PT/OT evaluations once N/V improving and pt able to participate.  Independent at baseline.   Acute anemia superimposed on anemia of chronic disease - Epogen per nephrology.   10/11: Hbg dropped from 9/7 to 7.2 on AM labs today.  No evidence of bleeding.  Platelets down also, possibly due to chemo. --FOBT pending --Monitor CBC   Thrombocytopenia - Plts 164k >>130 today.  Suspect due to recent chemo. --Hold Plavix   --Monitor CBC --SCD's for VTE prophylaxis.    DVT prophylaxis: SCDs Start: 06/21/20 6440   Diet:  Diet Orders (From admission, onward)    Start     Ordered   06/21/20 0819  Diet clear liquid Room service appropriate? Yes; Fluid consistency: Thin  Diet effective now       Question Answer Comment  Room service appropriate? Yes   Fluid consistency: Thin      06/21/20 0819            Code Status: Full Code    Subjective 06/22/20    Pt sleeping comfortably but responds to voice.  Reports ongoing nausea but currently controlled okay.  No fever/chills.  No acute events.  PD malfunction  overnight.  Discussed case with nephrology.   Disposition Plan & Communication   Status is: Inpatient  Remains inpatient appropriate because:IV treatments appropriate due to intensity of illness or inability to take PO   Dispo: The patient is from: Home              Anticipated d/c is to: Home              Anticipated d/c date is: 2 days              Patient currently is not medically stable to d/c.        Family Communication: none at bedside, will attempt to call    Consults, Procedures, Significant  Events   Consultants:   Nephrology  Procedures:   Peritoneal dialysis  Antimicrobials:  Anti-infectives (From admission, onward)   None        Objective   Vitals:   06/21/20 2336 06/22/20 0356 06/22/20 0821 06/22/20 1110  BP: (!) 154/72 (!) 151/68 (!) 159/69 (!) 122/58  Pulse: 80 72 73 70  Resp: 18 15 12 17   Temp: 98.4 F (36.9 C) 98.6 F (37 C)  97.7 F (36.5 C)  TempSrc: Oral Oral  Oral  SpO2: 100% 100% 100% 100%  Weight:      Height:        Intake/Output Summary (Last 24 hours) at 06/22/2020 1415 Last data filed at 06/21/2020 2100 Gross per 24 hour  Intake 100 ml  Output --  Net 100 ml   Filed Weights   06/20/20 2343  Weight: 56.7 kg    Physical Exam:  General exam: sleeping on side, responds to voice, minimally verbal, no acute distress Respiratory system: CTAB, no wheezes, normal respiratory effort. Cardiovascular system: normal Z0/S9, RRR, 2/6 systolic murmur, no pedal edema.   Gastrointestinal system: soft, mildly tender Skin: dry, intact, normal temperature    Labs   Data Reviewed: I have personally reviewed following labs and imaging studies  CBC: Recent Labs  Lab 06/18/20 0943 06/21/20 0000 06/22/20 0445  WBC 6.0 7.2 7.0  NEUTROABS 5.5 6.8  --   HGB 8.6* 9.7* 7.2*  HCT 23.8* 26.6* 20.3*  MCV 88.1 90.5 89.8  PLT 131* 164 233*   Basic Metabolic Panel: Recent Labs  Lab 06/18/20 0945 06/21/20 0000 06/21/20 0502 06/22/20 0445  NA 125* 129*  --  130*  K 2.6* 2.9*  --  2.8*  CL 86* 90*  --  93*  CO2 21* 20*  --  25  GLUCOSE 69* 74  --  117*  BUN 67* 88*  --  90*  CREATININE 16.79* 18.12*  --  17.15*  CALCIUM 6.1* 6.6*  --  7.0*  MG  --   --  1.6*  --    GFR: Estimated Creatinine Clearance: 2.8 mL/min (A) (by C-G formula based on SCr of 17.15 mg/dL (H)). Liver Function Tests: Recent Labs  Lab 06/18/20 0945  AST 10*  ALT <5  ALKPHOS 51  BILITOT 0.9  PROT 6.2*  ALBUMIN 2.3*   Recent Labs  Lab 06/21/20 0000   LIPASE 46   No results for input(s): AMMONIA in the last 168 hours. Coagulation Profile: No results for input(s): INR, PROTIME in the last 168 hours. Cardiac Enzymes: No results for input(s): CKTOTAL, CKMB, CKMBINDEX, TROPONINI in the last 168 hours. BNP (last 3 results) No results for input(s): PROBNP in the last 8760 hours. HbA1C: No results for input(s): HGBA1C in the last 72 hours. CBG:  Recent Labs  Lab 06/21/20 1602 06/22/20 0803 06/22/20 1108  GLUCAP 88 100* 121*   Lipid Profile: No results for input(s): CHOL, HDL, LDLCALC, TRIG, CHOLHDL, LDLDIRECT in the last 72 hours. Thyroid Function Tests: No results for input(s): TSH, T4TOTAL, FREET4, T3FREE, THYROIDAB in the last 72 hours. Anemia Panel: No results for input(s): VITAMINB12, FOLATE, FERRITIN, TIBC, IRON, RETICCTPCT in the last 72 hours. Sepsis Labs: No results for input(s): PROCALCITON, LATICACIDVEN in the last 168 hours.  Recent Results (from the past 240 hour(s))  Respiratory Panel by RT PCR (Flu A&B, Covid) - Nasopharyngeal Swab     Status: None   Collection Time: 06/21/20 12:05 PM   Specimen: Nasopharyngeal Swab  Result Value Ref Range Status   SARS Coronavirus 2 by RT PCR NEGATIVE NEGATIVE Final    Comment: (NOTE) SARS-CoV-2 target nucleic acids are NOT DETECTED.  The SARS-CoV-2 RNA is generally detectable in upper respiratoy specimens during the acute phase of infection. The lowest concentration of SARS-CoV-2 viral copies this assay can detect is 131 copies/mL. A negative result does not preclude SARS-Cov-2 infection and should not be used as the sole basis for treatment or other patient management decisions. A negative result may occur with  improper specimen collection/handling, submission of specimen other than nasopharyngeal swab, presence of viral mutation(s) within the areas targeted by this assay, and inadequate number of viral copies (<131 copies/mL). A negative result must be combined with  clinical observations, patient history, and epidemiological information. The expected result is Negative.  Fact Sheet for Patients:  PinkCheek.be  Fact Sheet for Healthcare Providers:  GravelBags.it  This test is no t yet approved or cleared by the [place] FDA and  has been authorized for detection and/or diagnosis of SARS-CoV-2 by FDA under an Emergency Use Authorization (EUA). This EUA will remain  in effect (meaning this test can be used) for the duration of the COVID-19 declaration under Section 564(b)(1) of the Act, 21 U.S.C. section 360bbb-3(b)(1), unless the authorization is terminated or revoked sooner.     Influenza A by PCR NEGATIVE NEGATIVE Final   Influenza B by PCR NEGATIVE NEGATIVE Final    Comment: (NOTE) The Xpert Xpress SARS-CoV-2/FLU/RSV assay is intended as an aid in  the diagnosis of influenza from Nasopharyngeal swab specimens and  should not be used as a sole basis for treatment. Nasal washings and  aspirates are unacceptable for Xpert Xpress SARS-CoV-2/FLU/RSV  testing.  Fact Sheet for Patients: PinkCheek.be  Fact Sheet for Healthcare Providers: GravelBags.it  This test is not yet approved or cleared by the [place] FDA and  has been authorized for detection and/or diagnosis of SARS-CoV-2 by  FDA under an Emergency Use Authorization (EUA). This EUA will remain  in effect (meaning this test can be used) for the duration of the  Covid-19 declaration under Section 564(b)(1) of the Act, 21  U.S.C. section 360bbb-3(b)(1), unless the authorization is  terminated or revoked. Performed at [facility], [address] [place], [place] [address]       Imaging Studies   CT ABDOMEN PELVIS WO CONTRAST  Result Date: 06/21/2020 CLINICAL DATA:  Pain. Diarrhea for 1 month. End-stage renal disease with peritoneal dialysis.  Cervical cancer undergoing chemotherapy and radiation therapy. EXAM: CT ABDOMEN AND PELVIS WITHOUT CONTRAST TECHNIQUE: Multidetector CT imaging of the abdomen and pelvis was performed following the standard protocol without IV contrast. COMPARISON:  PET 03/13/2020 FINDINGS: Lower chest: Clear lung bases. Normal heart size without pericardial or pleural effusion. Lad  coronary artery calcification. Hepatobiliary: Normal liver. Cholecystectomy, without biliary ductal dilatation. Pancreas: Normal, without mass or ductal dilatation. Spleen: Normal in size, new hypoattenuation within the superior anterior spleen on 15/2. Adrenals/Urinary Tract: Left greater than right adrenal enlargement. The right adrenal is somewhat nodular but maintains its adreniform shape, similar. The left adrenal measures maximally 2.6 x 1.7 cm and low-density, favoring an adenoma. Bilateral renal vascular calcifications. No hydronephrosis. Decompressed urinary bladder. Subtle pericystic edema. Stomach/Bowel: Normal stomach, without wall thickening. The colon is relatively diffusely decompressed, but thick walled. Most apparent within the transverse and ascending segments on 41/2. Normal terminal ileum. Appendix favored to be identified on 58/2, without evidence of appendicitis. Normal small bowel. Vascular/Lymphatic: Advanced aortic and branch vessel atherosclerosis. No abdominopelvic adenopathy. Reproductive: Normal uterus. No adnexal mass. Suspect a tampon in place. Other: Decrease in trace abdominopelvic fluid. Peritoneal dialysis catheter terminating in the right hemipelvis. No free intraperitoneal air. Diffuse anasarca. Musculoskeletal: Lumbosacral spondylosis with trace L4-5 anterolisthesis. IMPRESSION: 1. Relatively decompressed, but thick walled colon, consistent with colitis. Favor infection. 2. Decrease in trace abdominopelvic fluid with peritoneal dialysis catheter in place. 3. New hypoattenuation within the superior anterior spleen,  suspicious for infarct. 4. Subtle pericystic edema, possibly radiation induced. Recommend clinical exclusion of infectious cystitis. At 5. Coronary artery atherosclerosis. Aortic Atherosclerosis (ICD10-I70.0). 6. Left adrenal nodule, favored to represent an adenoma. Electronically Signed   By: Abigail Miyamoto M.D.   On: 06/21/2020 08:54     Medications   Scheduled Meds: . calcitRIOL  0.25 mcg Oral Daily  . calcium acetate  1,334 mg Oral BID WC  . carvedilol  25 mg Oral BID WC  . diltiazem  180 mg Oral Daily  . gentamicin cream  1 application Topical Daily  . pantoprazole (PROTONIX) IV  40 mg Intravenous Daily  . potassium chloride  40 mEq Oral Once   Continuous Infusions: . 0.9 % NaCl with KCl 40 mEq / L 75 mL/hr at 06/22/20 1254  . dialysis solution 2.5% low-MG/low-CA         LOS: 1 day    Time spent: 25 minutes with > 50% spent in coordination of care and direct patient contact.    Ezekiel Slocumb, DO [facility]  06/22/2020, 2:15 PM    If 7PM-7AM, please contact night-coverage. How to contact the [facility] Attending or Consulting provider [place] or covering provider during after hours [place], for this patient?    1. Check the care team in [facility] and look for a) attending/consulting [facility] provider listed and b) the [facility] team listed 2. Log into [email or web address] and use [place]'s universal password to access. If you do not have the password, please contact the hospital operator. 3. Locate the [facility] provider you are looking for under [facility] and page to a number that you can be directly reached. 4. If you still have difficulty reaching the provider, please page the [facility] (Director on Call) for the Hospitalists listed on amion for assistance.

## 2020-06-22 NOTE — Progress Notes (Signed)
Cross Cover Note  Nurse reports HGB 6.5 FOBT noted + earlier today Discussed risks and benefits of blood transfusion Patient agreed to PRBC transfusion and 1 unit ordered. Protonix increased to 40mg  BID

## 2020-06-22 NOTE — Progress Notes (Signed)
Initial Nutrition Assessment  DOCUMENTATION CODES:   Not applicable  INTERVENTION:  Boost Breeze po TID, each supplement provides 250 kcal and 9 grams of protein  Renavite po daily in the morning   NUTRITION DIAGNOSIS:   Inadequate oral intake related to nausea, vomiting as evidenced by per patient/family report.    GOAL:   Patient will meet greater than or equal to 90% of their needs    MONITOR:   Diet advancement, PO intake, Labs, Weight trends, I & O's, Supplement acceptance  REASON FOR ASSESSMENT:   Malnutrition Screening Tool    ASSESSMENT:  64 year old female with history significant of ESRD on PD, history of cervical cancer s/p chemo/rxt therapy, scheduled for follow-up at [facility] for brachytherapy d/t persistent disease and HTN who presents with 1 week of nausea, vomiting, and diarrhea, generalized abdominal pain, and body aches.  Pt admitted with hypocalcemia.  Pt did not tolerate PD overnight, low drain volumes, plans for PD this evening after IVF. Pt curled onto side with blankets pulled over head at RD visit. Observed untouched clear liquid lunch at bedside. She reports ongoing nausea, limited history obtained. Pt endorsed significant nausea with vomiting that started around 4 days prior to admission, normally appetite and intake is "alright". Pt requested RD to come back at different time.   Per chart, weights have trended down 5 lbs (3.9%) in the past 3 weeks; significant. Per chart, pt is 20 lbs under EDW of 66 kg. Given trends and very poor po intake suspect degree of acute malnutrition, however unable to identify at this time. Will order Boost Breeze supplement while on clears to aid with needs.   Medications reviewed and include: Calcitriol, Phoslo, Coreg, Protonix, Klor-con NaCl with KCl 40 mEq Labs: CBGs 114,121,100, Na 130 (L), K 2.8 (L), BUN 90 (H), Cr 17.15 (H), Hgb 7.2 (L), HCT 20.3 (L)  NUTRITION - FOCUSED PHYSICAL EXAM: Unable to complete at this  time, pt completely covered with blankets and reports nausea.    Diet Order:   Diet Order            Diet clear liquid Room service appropriate? Yes; Fluid consistency: Thin  Diet effective now                 EDUCATION NEEDS:   No education needs have been identified at this time  Skin:  Skin Assessment: Reviewed RN Assessment  Last BM:  10/11 type 7  Height:   Ht Readings from Last 1 Encounters:  06/20/20 5\' 3"  (1.6 m)    Weight:   Wt Readings from Last 1 Encounters:  06/20/20 56.7 kg    BMI:  Body mass index is 22.14 kg/m.  Estimated Nutritional Needs:   Kcal:  1800-2000  Protein:  90-100  Fluid:  1000 ml + UOP   Lajuan Lines, RD, LDN Clinical Nutrition After Hours/Weekend Pager # in [place]

## 2020-06-22 NOTE — Progress Notes (Signed)
[facility]  ROUNDING NOTE   Subjective:   [name] was admitted to [facility] on 06/21/2020 for Hypocalcemia [E83.51] Hypokalemia [E87.6] Hypomagnesemia [E83.42] Chronic abdominal pain [R10.9, G89.29] Intractable vomiting [R11.10] Generalized weakness [R53.1] ESRD on peritoneal dialysis ([place]) [N18.6, Z99.2] Intractable nausea and vomiting [R11.2] Refractory nausea and vomiting [R11.2] Intractable diarrhea [R19.7]  Patient found lying in bed, lethargic,sleepy,but arousable.    Objective:  Vital signs in last 24 hours:  Temp:  [97.7 F (36.5 C)-98.6 F (37 C)] 97.7 F (36.5 C) (10/11 1110) Pulse Rate:  [70-80] 70 (10/11 1110) Resp:  [12-20] 17 (10/11 1110) BP: (122-159)/(58-72) 122/58 (10/11 1110) SpO2:  [100 %] 100 % (10/11 1110)  Weight change:  Filed Weights   06/20/20 2343  Weight: 56.7 kg    Intake/Output: I/O last 3 completed shifts: In: 750 [P.O.:100; IV Piggyback:650] Out: -    Intake/Output this shift:  No intake/output data recorded.  Physical Exam: General: No acute distress ,lethargic  Head: Normocephalic, atraumatic. Moist oral mucosal membranes  Eyes: Anicteric  Neck: Supple, trachea at midline  Lungs:  Clear to auscultation  Heart: S1S2, Regular rate and rhythm  Abdomen:  Soft, nontender,PD catheter site with dressing clean,dry and intact   Extremities: No peripheral edema.  Neurologic: Sleeping,awake to call,oriented, moving all four extremities  Skin: No acute lesions or rashes  Access: PD catheter    Basic Metabolic Panel: Recent Labs  Lab 06/18/20 0945 06/21/20 0000 06/21/20 0502 06/22/20 0445  NA 125* 129*  --  130*  K 2.6* 2.9*  --  2.8*  CL 86* 90*  --  93*  CO2 21* 20*  --  25  GLUCOSE 69* 74  --  117*  BUN 67* 88*  --  90*  CREATININE 16.79* 18.12*  --  17.15*  CALCIUM 6.1* 6.6*  --  7.0*  MG  --   --  1.6*  --     Liver Function Tests: Recent Labs  Lab 06/18/20 0945  AST 10*  ALT <5  ALKPHOS  51  BILITOT 0.9  PROT 6.2*  ALBUMIN 2.3*   Recent Labs  Lab 06/21/20 0000  LIPASE 46   No results for input(s): AMMONIA in the last 168 hours.  CBC: Recent Labs  Lab 06/18/20 0943 06/21/20 0000 06/22/20 0445  WBC 6.0 7.2 7.0  NEUTROABS 5.5 6.8  --   HGB 8.6* 9.7* 7.2*  HCT 23.8* 26.6* 20.3*  MCV 88.1 90.5 89.8  PLT 131* 164 130*    Cardiac Enzymes: No results for input(s): CKTOTAL, CKMB, CKMBINDEX, TROPONINI in the last 168 hours.  BNP: Invalid input(s): POCBNP  CBG: Recent Labs  Lab 06/21/20 1602 06/22/20 0803 06/22/20 1108  GLUCAP 88 100* 121*    Microbiology: Results for orders placed or performed during the hospital encounter of 06/21/20  Respiratory Panel by RT PCR (Flu A&B, Covid) - Nasopharyngeal Swab     Status: None   Collection Time: 06/21/20 12:05 PM   Specimen: Nasopharyngeal Swab  Result Value Ref Range Status   SARS Coronavirus 2 by RT PCR NEGATIVE NEGATIVE Final    Comment: (NOTE) SARS-CoV-2 target nucleic acids are NOT DETECTED.  The SARS-CoV-2 RNA is generally detectable in upper respiratoy specimens during the acute phase of infection. The lowest concentration of SARS-CoV-2 viral copies this assay can detect is 131 copies/mL. A negative result does not preclude SARS-Cov-2 infection and should not be used as the sole basis for treatment or other patient management decisions. A  negative result may occur with  improper specimen collection/handling, submission of specimen other than nasopharyngeal swab, presence of viral mutation(s) within the areas targeted by this assay, and inadequate number of viral copies (<131 copies/mL). A negative result must be combined with clinical observations, patient history, and epidemiological information. The expected result is Negative.  Fact Sheet for Patients:  PinkCheek.be  Fact Sheet for Healthcare Providers:  GravelBags.it  This test is  no t yet approved or cleared by the [place] FDA and  has been authorized for detection and/or diagnosis of SARS-CoV-2 by FDA under an Emergency Use Authorization (EUA). This EUA will remain  in effect (meaning this test can be used) for the duration of the COVID-19 declaration under Section 564(b)(1) of the Act, 21 U.S.C. section 360bbb-3(b)(1), unless the authorization is terminated or revoked sooner.     Influenza A by PCR NEGATIVE NEGATIVE Final   Influenza B by PCR NEGATIVE NEGATIVE Final    Comment: (NOTE) The Xpert Xpress SARS-CoV-2/FLU/RSV assay is intended as an aid in  the diagnosis of influenza from Nasopharyngeal swab specimens and  should not be used as a sole basis for treatment. Nasal washings and  aspirates are unacceptable for Xpert Xpress SARS-CoV-2/FLU/RSV  testing.  Fact Sheet for Patients: PinkCheek.be  Fact Sheet for Healthcare Providers: GravelBags.it  This test is not yet approved or cleared by the [place] FDA and  has been authorized for detection and/or diagnosis of SARS-CoV-2 by  FDA under an Emergency Use Authorization (EUA). This EUA will remain  in effect (meaning this test can be used) for the duration of the  Covid-19 declaration under Section 564(b)(1) of the Act, 21  U.S.C. section 360bbb-3(b)(1), unless the authorization is  terminated or revoked. Performed at [facility], [address] [place], [place] [address]     Coagulation Studies: No results for input(s): LABPROT, INR in the last 72 hours.  Urinalysis: No results for input(s): COLORURINE, LABSPEC, PHURINE, GLUCOSEU, HGBUR, BILIRUBINUR, KETONESUR, PROTEINUR, UROBILINOGEN, NITRITE, LEUKOCYTESUR in the last 72 hours.  Invalid input(s): APPERANCEUR    Imaging: CT ABDOMEN PELVIS WO CONTRAST  Result Date: 06/21/2020 CLINICAL DATA:  Pain. Diarrhea for 1 month. End-stage renal disease with peritoneal  dialysis. Cervical cancer undergoing chemotherapy and radiation therapy. EXAM: CT ABDOMEN AND PELVIS WITHOUT CONTRAST TECHNIQUE: Multidetector CT imaging of the abdomen and pelvis was performed following the standard protocol without IV contrast. COMPARISON:  PET 03/13/2020 FINDINGS: Lower chest: Clear lung bases. Normal heart size without pericardial or pleural effusion. Lad coronary artery calcification. Hepatobiliary: Normal liver. Cholecystectomy, without biliary ductal dilatation. Pancreas: Normal, without mass or ductal dilatation. Spleen: Normal in size, new hypoattenuation within the superior anterior spleen on 15/2. Adrenals/Urinary Tract: Left greater than right adrenal enlargement. The right adrenal is somewhat nodular but maintains its adreniform shape, similar. The left adrenal measures maximally 2.6 x 1.7 cm and low-density, favoring an adenoma. Bilateral renal vascular calcifications. No hydronephrosis. Decompressed urinary bladder. Subtle pericystic edema. Stomach/Bowel: Normal stomach, without wall thickening. The colon is relatively diffusely decompressed, but thick walled. Most apparent within the transverse and ascending segments on 41/2. Normal terminal ileum. Appendix favored to be identified on 58/2, without evidence of appendicitis. Normal small bowel. Vascular/Lymphatic: Advanced aortic and branch vessel atherosclerosis. No abdominopelvic adenopathy. Reproductive: Normal uterus. No adnexal mass. Suspect a tampon in place. Other: Decrease in trace abdominopelvic fluid. Peritoneal dialysis catheter terminating in the right hemipelvis. No free intraperitoneal air. Diffuse anasarca. Musculoskeletal: Lumbosacral spondylosis with trace L4-5 anterolisthesis.  IMPRESSION: 1. Relatively decompressed, but thick walled colon, consistent with colitis. Favor infection. 2. Decrease in trace abdominopelvic fluid with peritoneal dialysis catheter in place. 3. New hypoattenuation within the superior anterior  spleen, suspicious for infarct. 4. Subtle pericystic edema, possibly radiation induced. Recommend clinical exclusion of infectious cystitis. At 5. Coronary artery atherosclerosis. Aortic Atherosclerosis (ICD10-I70.0). 6. Left adrenal nodule, favored to represent an adenoma. Electronically Signed   By: Abigail Miyamoto M.D.   On: 06/21/2020 08:54     Medications:   . 0.9 % NaCl with KCl 40 mEq / L     . calcitRIOL  0.25 mcg Oral Daily  . calcium acetate  1,334 mg Oral BID WC  . carvedilol  25 mg Oral BID WC  . clopidogrel  75 mg Oral Daily  . diltiazem  180 mg Oral Daily  . pantoprazole (PROTONIX) IV  40 mg Intravenous Daily  . potassium chloride  40 mEq Oral Once   acetaminophen **OR** acetaminophen, diphenoxylate-atropine, hydrALAZINE, labetalol, ondansetron **OR** ondansetron (ZOFRAN) IV  Assessment/ Plan:  [name] is a 63 y.o. black female with end stage renal disease on peritoneal dialysis, cervical cancer, hypertension, diabetes mellitus type II, peripheral vascular disease, hyperlipidemia, who is admitted to [facility] on 06/21/2020 for Hypocalcemia [E83.51] Hypokalemia [E87.6] Hypomagnesemia [E83.42] Chronic abdominal pain [R10.9, G89.29] Intractable vomiting [R11.10] Generalized weakness [R53.1] ESRD on peritoneal dialysis ([place]) [N18.6, Z99.2] Intractable nausea and vomiting [R11.2] Refractory nausea and vomiting [R11.2] Intractable diarrhea [R19.7]  [facility] 66kg CCPD 9 hours, 4 exchanges, 2570mL fills with last fill of 2032mL with icodextran.   1. End Stage Renal Disease on peritoneal dialysis.  Patient received treatment last night Patient appears dehydrated Will change the dialysate order today  2. Hypertension: elevated. Home regimen of carvedilol and diltiazem.  Blood pressure readings within acceptable range today Continue current antihypertensive regimen of Carvedilol, Diltiazem., PRN Hydralazine and PRN Labetalol  3.  Hypokalemia: with GI losses. On oral potassium supplementation at home.  - K+2.8 - Receiving IV Potassium chloride.   -Will continue monitoring closely  4. Hyponatremia: secondary to GI losses and kidney failure. _Na+ 130  _ Hypovolemic on examination.  - IV NS with KCL 75 ml/hr started  5. Anemia with chronic kidney disease:  Hemoglobin 7.2 today  EPO as outpatient.  Will check stool for occult blood  7. Secondary Hyperparathyroidism with hypocalcemia: Calcium 7.0 today  - calcitriol and calcium acetate.    LOS: 1 Tracy Jennings 10/11/202111:36 AM

## 2020-06-22 NOTE — Progress Notes (Signed)
Peritoneal Dialysis stopped due to machine/system not functioning and not able to troubleshoot problem after calling the baxter and on call dialysis nurse. Informed by on call nurse to turn off machine and someone will be in the am to fix the problem. Pt educated and informed and verbalized understanding. Will continue to monitor.

## 2020-06-22 NOTE — Hospital Course (Signed)
CAROLEANN CASLER is a 64 y.o. female with medical history significant ESRD on peritoneal dialysis, history of cervical cancer (completed chemo and radiation therapy) and is scheduled to follow-up at [facility] for brachytherapy due to persistent disease and hypertension.  She presented to the ED on evening of 06/20/20 with a 1 week history of nausea, vomiting and diarrhea, inability to tolerate oral intake, generalized abdominal pain (pt states is chronic) and generalized weakness.    In the ED, sodium of 129, potassium 2.9, chloride of 90, bicarb 20, glucose 75, BUN 88, creatinine 18.12, calcium 6.6, magnesium 1.6, white count 7.2, hemoglobin 9.7, hematocrit 26.6, MCV 90.5, RDW 14.6, platelet count 164.

## 2020-06-23 DIAGNOSIS — K921 Melena: Secondary | ICD-10-CM | POA: Diagnosis not present

## 2020-06-23 DIAGNOSIS — D649 Anemia, unspecified: Secondary | ICD-10-CM | POA: Diagnosis not present

## 2020-06-23 DIAGNOSIS — N186 End stage renal disease: Secondary | ICD-10-CM | POA: Diagnosis not present

## 2020-06-23 DIAGNOSIS — R112 Nausea with vomiting, unspecified: Secondary | ICD-10-CM | POA: Diagnosis not present

## 2020-06-23 DIAGNOSIS — E876 Hypokalemia: Secondary | ICD-10-CM | POA: Diagnosis not present

## 2020-06-23 LAB — CBC
HCT: 25.5 % — ABNORMAL LOW (ref 36.0–46.0)
HCT: 25.6 % — ABNORMAL LOW (ref 36.0–46.0)
HCT: 28.7 % — ABNORMAL LOW (ref 36.0–46.0)
Hemoglobin: 9.1 g/dL — ABNORMAL LOW (ref 12.0–15.0)
Hemoglobin: 9.2 g/dL — ABNORMAL LOW (ref 12.0–15.0)
Hemoglobin: 10.2 g/dL — ABNORMAL LOW (ref 12.0–15.0)
MCH: 31.6 pg (ref 26.0–34.0)
MCH: 31.8 pg (ref 26.0–34.0)
MCH: 31.6 pg (ref 26.0–34.0)
MCHC: 35.7 g/dL (ref 30.0–36.0)
MCHC: 35.9 g/dL (ref 30.0–36.0)
MCHC: 35.5 g/dL (ref 30.0–36.0)
MCV: 88.5 fL (ref 80.0–100.0)
MCV: 88.6 fL (ref 80.0–100.0)
MCV: 88.9 fL (ref 80.0–100.0)
Platelets: 124 10*3/uL — ABNORMAL LOW (ref 150–400)
Platelets: 133 10*3/uL — ABNORMAL LOW (ref 150–400)
Platelets: 156 10*3/uL (ref 150–400)
RBC: 2.88 MIL/uL — ABNORMAL LOW (ref 3.87–5.11)
RBC: 2.89 MIL/uL — ABNORMAL LOW (ref 3.87–5.11)
RBC: 3.23 MIL/uL — ABNORMAL LOW (ref 3.87–5.11)
RDW: 14.7 % (ref 11.5–15.5)
RDW: 15.2 % (ref 11.5–15.5)
RDW: 15.6 % — ABNORMAL HIGH (ref 11.5–15.5)
WBC: 7.7 10*3/uL (ref 4.0–10.5)
WBC: 7.9 10*3/uL (ref 4.0–10.5)
WBC: 8.8 10*3/uL (ref 4.0–10.5)
nRBC: 0 % (ref 0.0–0.2)
nRBC: 0 % (ref 0.0–0.2)
nRBC: 0 % (ref 0.0–0.2)

## 2020-06-23 LAB — BASIC METABOLIC PANEL
Anion gap: 15 (ref 5–15)
BUN: 82 mg/dL — ABNORMAL HIGH (ref 8–23)
CO2: 19 mmol/L — ABNORMAL LOW (ref 22–32)
Calcium: 6.9 mg/dL — ABNORMAL LOW (ref 8.9–10.3)
Chloride: 96 mmol/L — ABNORMAL LOW (ref 98–111)
Creatinine, Ser: 15.18 mg/dL — ABNORMAL HIGH (ref 0.44–1.00)
GFR, Estimated: 2 mL/min — ABNORMAL LOW (ref >60)
Glucose, Bld: 101 mg/dL — ABNORMAL HIGH (ref 70–99)
Potassium: 3.3 mmol/L — ABNORMAL LOW (ref 3.5–5.1)
Sodium: 130 mmol/L — ABNORMAL LOW (ref 135–145)

## 2020-06-23 LAB — HEPATIC FUNCTION PANEL
ALT: <5 U/L (ref 0–44)
AST: 13 U/L — ABNORMAL LOW (ref 15–41)
Albumin: 2.1 g/dL — ABNORMAL LOW (ref 3.5–5.0)
Alkaline Phosphatase: 42 U/L (ref 38–126)
Bilirubin, Direct: <0.1 mg/dL (ref 0.0–0.2)
Total Bilirubin: 0.7 mg/dL (ref 0.3–1.2)
Total Protein: 5.4 g/dL — ABNORMAL LOW (ref 6.5–8.1)

## 2020-06-23 LAB — IRON AND TIBC: Iron: 89 ug/dL (ref 28–170)

## 2020-06-23 LAB — GLUCOSE, CAPILLARY
Glucose-Capillary: 84 mg/dL (ref 70–99)
Glucose-Capillary: 79 mg/dL (ref 70–99)
Glucose-Capillary: 76 mg/dL (ref 70–99)
Glucose-Capillary: 79 mg/dL (ref 70–99)

## 2020-06-23 LAB — PREPARE RBC (CROSSMATCH)

## 2020-06-23 LAB — VITAMIN B12: Vitamin B-12: 791 pg/mL (ref 180–914)

## 2020-06-23 LAB — FOLATE: Folate: 5.1 ng/mL — ABNORMAL LOW (ref >5.9)

## 2020-06-23 LAB — RETICULOCYTES
Immature Retic Fract: 9.7 % (ref 2.3–15.9)
RBC.: 2.96 MIL/uL — ABNORMAL LOW (ref 3.87–5.11)
Retic Count, Absolute: 43.2 10*3/uL (ref 19.0–186.0)
Retic Ct Pct: 1.5 % (ref 0.4–3.1)

## 2020-06-23 LAB — FERRITIN: Ferritin: 1482 ng/mL — ABNORMAL HIGH (ref 11–307)

## 2020-06-23 LAB — MAGNESIUM: Magnesium: 1.9 mg/dL (ref 1.7–2.4)

## 2020-06-23 MED ORDER — MORPHINE SULFATE (PF) 2 MG/ML IV SOLN
2.0000 mg | Freq: Once | INTRAVENOUS | Status: AC
  Administered 2020-06-23: 2 mg via INTRAVENOUS
  Filled 2020-06-23: qty 1

## 2020-06-23 MED ORDER — LABETALOL HCL 5 MG/ML IV SOLN
10.0000 mg | INTRAVENOUS | Status: DC | PRN
  Administered 2020-06-24 – 2020-06-26 (×2): 10 mg via INTRAVENOUS
  Filled 2020-06-23 (×3): qty 4

## 2020-06-23 MED ORDER — EPOETIN ALFA 10000 UNIT/ML IJ SOLN: 10000.0000 [IU] | INTRAMUSCULAR | Status: DC

## 2020-06-23 MED ORDER — POTASSIUM CHLORIDE IN NACL 20-0.9 MEQ/L-% IV SOLN
INTRAVENOUS | Status: DC
  Filled 2020-06-23 (×4): qty 1000

## 2020-06-23 MED ORDER — PROMETHAZINE HCL 25 MG/ML IJ SOLN
12.5000 mg | Freq: Once | INTRAMUSCULAR | Status: AC
  Administered 2020-06-23: 12.5 mg via INTRAVENOUS
  Filled 2020-06-23: qty 1

## 2020-06-23 MED ORDER — PROMETHAZINE HCL 25 MG/ML IJ SOLN
12.5000 mg | Freq: Four times a day (QID) | INTRAMUSCULAR | Status: DC | PRN
  Administered 2020-06-23 – 2020-06-28 (×13): 12.5 mg via INTRAVENOUS
  Filled 2020-06-23 (×13): qty 1

## 2020-06-23 NOTE — Progress Notes (Addendum)
PROGRESS NOTE    Tracy Jennings   HWE:[id number]  DOB: Aug 14, 1956  PCP: Sharyne Peach, MD    DOA: 06/21/2020 LOS: 2   Brief Narrative   Tracy Jennings is a 64 y.o. female with medical history significant ESRD on peritoneal dialysis, history of cervical cancer (completed chemo and radiation therapy) and is scheduled to follow-up at [facility] for brachytherapy due to persistent disease and hypertension.  She presented to the ED on evening of 06/20/20 with a 1 week history of nausea, vomiting and diarrhea, inability to tolerate oral intake, generalized abdominal pain (pt states is chronic) and generalized weakness.    In the ED, sodium of 129, potassium 2.9, chloride of 90, bicarb 20, glucose 75, BUN 88, creatinine 18.12, calcium 6.6, magnesium 1.6, white count 7.2, hemoglobin 9.7, hematocrit 26.6, MCV 90.5, RDW 14.6, platelet count 164.       Assessment & Plan   Principal Problem:   Intractable vomiting Active Problems:   ESRD (end stage renal disease) (HCC)   Generalized weakness   Cervical cancer (HCC)   Hypokalemia   Hyponatremia   Hypomagnesemia   Intractable nausea and vomiting   Refractory nausea and vomiting   Intractable nausea and vomiting - most likely due to recent chemo (Cisplatin) for cervical cancer.   --Continue IV hydration, per nephrology --PRN IV antiemetics --IV Protonix daily --Clear liquid diet for now, advance as tolerated --Monitor electrolytes and replace as needed   Acute anemia superimposed on anemia of chronic disease - Epogen per nephrology.   10/11: Hbg dropped from 9/7 to 7.2 on AM labs 10/11.  Platelets down also, possibly due to chemo.  FOBT was positive however. Later on 10/11 Hbg down to 6.5, was transfused 1 unit pRBC's with improved Hbg to 9.1 this AM. --GI consulted --Monitor CBC --Transfuse if Hbg < 7.0 --Follow up anemia panel   Diarrhea - present on admission, may also be due to chemo.  C diff negative.   ESRD on  peritoneal dialysis - nephrology consulted for PD.   Overnight 10/10-11 and again 10/11-12 PD malfunction and had to stop treatment early.   --started on subcutaneous EPO   Hypokalemia / Hypomagnesemia / Hyponatremia - secondary to GI losses from nausea, vomiting and diarrhea.  --Monitor BMP and Mg --IV fluids, replace K and Mg as needed   Cervical cancer - no acute issues.  Pt is s/p chemoradiation therapy and will be referred to [facility] for brachytherapy.  --Follow-up with oncology as an outpatient   Hypertension - chronic, uncontrolled on admission, suspect due to pain and unable to take her meds with N/V. --Continue home Coreg, Cardizem --PRN IV hydralazine    Generalized weakness - multifactorial due to electrolyte derangements, poor oral intake, malignancy. Will consider PT/OT evaluations once N/V improving and pt able to participate.  Independent at baseline.   Thrombocytopenia - Plts 164k >>130 today.  Suspect due to recent chemo. --Hold Plavix   --Monitor CBC --SCD's for VTE prophylaxis.    DVT prophylaxis: SCDs Start: 06/21/20 8938   Diet:  Diet Orders (From admission, onward)    Start     Ordered   06/21/20 0819  Diet clear liquid Room service appropriate? Yes; Fluid consistency: Thin  Diet effective now       Question Answer Comment  Room service appropriate? Yes   Fluid consistency: Thin      06/21/20 0819            Code Status: Full Code  Subjective 06/23/20    Pt sleeping comfortably and asks to be allowed to rest, declines to answer my questions.  Nursing and nephrologist both reported patient not engaging with them either, also not taking any PO meds or food/drinks.  PD malfunction again last night.     Disposition Plan & Communication   Status is: Inpatient  Remains inpatient appropriate because:IV treatments appropriate due to intensity of illness or inability to take PO.  PD malfunctions, patient needing renal replacement, and has  no tolerance for oral intake yet.   Dispo: The patient is from: Home              Anticipated d/c is to: Home              Anticipated d/c date is: 2-3 days              Patient currently is not medically stable to d/c.   Family Communication: none at bedside, will attempt to call    Consults, Procedures, Significant Events   Consultants:   Nephrology  Procedures:   Peritoneal dialysis  Antimicrobials:  Anti-infectives (From admission, onward)   None        Objective   Vitals:   06/23/20 0420 06/23/20 1045 06/23/20 1154 06/23/20 1156  BP: (!) 165/65 (!) 192/77 (!) 203/89 (!) 194/72  Pulse: 87 88 87 84  Resp: 17 16 16    Temp: 98 F (36.7 C) 98.5 F (36.9 C) 98.4 F (36.9 C)   TempSrc: Oral  Oral   SpO2: 100% 100% 100%   Weight:      Height:        Intake/Output Summary (Last 24 hours) at 06/23/2020 1314 Last data filed at 06/23/2020 0420 Gross per 24 hour  Intake 890.28 ml  Output --  Net 890.28 ml   Filed Weights   06/20/20 2343  Weight: 56.7 kg    Physical Exam:  General exam: sleeping on side, responsive but minimally verbal, no acute distress Respiratory system: CTAB, no wheezes, normal respiratory effort. Cardiovascular system: normal F8/H8, RRR, 2/6 systolic murmur, no pedal edema.   Gastrointestinal system: soft, not distended, no guarding or rebound tenderness, PD catheter in place    Labs   Data Reviewed: I have personally reviewed following labs and imaging studies  CBC: Recent Labs  Lab 06/18/20 0943 06/21/20 0000 06/22/20 0445 06/22/20 1805 06/23/20 0534  WBC 6.0 7.2 7.0  --  7.7  NEUTROABS 5.5 6.8  --   --   --   HGB 8.6* 9.7* 7.2* 6.5* 9.1*  HCT 23.8* 26.6* 20.3* 18.5* 25.5*  MCV 88.1 90.5 89.8  --  88.5  PLT 131* 164 130*  --  299*   Basic Metabolic Panel: Recent Labs  Lab 06/18/20 0945 06/21/20 0000 06/21/20 0502 06/22/20 0445 06/23/20 0534  NA 125* 129*  --  130* 130*  K 2.6* 2.9*  --  2.8* 3.3*  CL 86* 90*   --  93* 96*  CO2 21* 20*  --  25 19*  GLUCOSE 69* 74  --  117* 101*  BUN 67* 88*  --  90* 82*  CREATININE 16.79* 18.12*  --  17.15* 15.18*  CALCIUM 6.1* 6.6*  --  7.0* 6.9*  MG  --   --  1.6* 2.0 1.9   GFR: Estimated Creatinine Clearance: 3.2 mL/min (A) (by C-G formula based on SCr of 15.18 mg/dL (H)). Liver Function Tests: Recent Labs  Lab 06/18/20 0945 06/23/20 0534  AST 10*  13*  ALT <5 <5  ALKPHOS 51 42  BILITOT 0.9 0.7  PROT 6.2* 5.4*  ALBUMIN 2.3* 2.1*   Recent Labs  Lab 06/21/20 0000  LIPASE 46   No results for input(s): AMMONIA in the last 168 hours. Coagulation Profile: No results for input(s): INR, PROTIME in the last 168 hours. Cardiac Enzymes: No results for input(s): CKTOTAL, CKMB, CKMBINDEX, TROPONINI in the last 168 hours. BNP (last 3 results) No results for input(s): PROBNP in the last 8760 hours. HbA1C: No results for input(s): HGBA1C in the last 72 hours. CBG: Recent Labs  Lab 06/22/20 0803 06/22/20 1108 06/22/20 1634 06/23/20 0749 06/23/20 1154  GLUCAP 100* 121* 114* 84 79   Lipid Profile: No results for input(s): CHOL, HDL, LDLCALC, TRIG, CHOLHDL, LDLDIRECT in the last 72 hours. Thyroid Function Tests: No results for input(s): TSH, T4TOTAL, FREET4, T3FREE, THYROIDAB in the last 72 hours. Anemia Panel: Recent Labs    06/23/20 1154  RETICCTPCT 1.5   Sepsis Labs: No results for input(s): PROCALCITON, LATICACIDVEN in the last 168 hours.  Recent Results (from the past 240 hour(s))  Respiratory Panel by RT PCR (Flu A&B, Covid) - Nasopharyngeal Swab     Status: None   Collection Time: 06/21/20 12:05 PM   Specimen: Nasopharyngeal Swab  Result Value Ref Range Status   SARS Coronavirus 2 by RT PCR NEGATIVE NEGATIVE Final    Comment: (NOTE) SARS-CoV-2 target nucleic acids are NOT DETECTED.  The SARS-CoV-2 RNA is generally detectable in upper respiratoy specimens during the acute phase of infection. The lowest concentration of SARS-CoV-2  viral copies this assay can detect is 131 copies/mL. A negative result does not preclude SARS-Cov-2 infection and should not be used as the sole basis for treatment or other patient management decisions. A negative result may occur with  improper specimen collection/handling, submission of specimen other than nasopharyngeal swab, presence of viral mutation(s) within the areas targeted by this assay, and inadequate number of viral copies (<131 copies/mL). A negative result must be combined with clinical observations, patient history, and epidemiological information. The expected result is Negative.  Fact Sheet for Patients:  PinkCheek.be  Fact Sheet for Healthcare Providers:  GravelBags.it  This test is no t yet approved or cleared by the [place] FDA and  has been authorized for detection and/or diagnosis of SARS-CoV-2 by FDA under an Emergency Use Authorization (EUA). This EUA will remain  in effect (meaning this test can be used) for the duration of the COVID-19 declaration under Section 564(b)(1) of the Act, 21 U.S.C. section 360bbb-3(b)(1), unless the authorization is terminated or revoked sooner.     Influenza A by PCR NEGATIVE NEGATIVE Final   Influenza B by PCR NEGATIVE NEGATIVE Final    Comment: (NOTE) The Xpert Xpress SARS-CoV-2/FLU/RSV assay is intended as an aid in  the diagnosis of influenza from Nasopharyngeal swab specimens and  should not be used as a sole basis for treatment. Nasal washings and  aspirates are unacceptable for Xpert Xpress SARS-CoV-2/FLU/RSV  testing.  Fact Sheet for Patients: PinkCheek.be  Fact Sheet for Healthcare Providers: GravelBags.it  This test is not yet approved or cleared by the [place] FDA and  has been authorized for detection and/or diagnosis of SARS-CoV-2 by  FDA under an Emergency Use Authorization (EUA).  This EUA will remain  in effect (meaning this test can be used) for the duration of the  Covid-19 declaration under Section 564(b)(1) of the Act, 21  U.S.C. section 360bbb-3(b)(1), unless the authorization  is  terminated or revoked. Performed at [facility], [address] [place], [place] [address]   C Difficile Quick Screen w PCR reflex     Status: None   Collection Time: 06/22/20 12:35 PM   Specimen: STOOL  Result Value Ref Range Status   C Diff antigen NEGATIVE NEGATIVE Final   C Diff toxin NEGATIVE NEGATIVE Final   C Diff interpretation No C. difficile detected.  Final    Comment: Performed at [facility], [address] [place], [place] [address]      Imaging Studies   No results found.   Medications   Scheduled Meds: . calcitRIOL  0.25 mcg Oral Daily  . calcium acetate  1,334 mg Oral BID WC  . carvedilol  25 mg Oral BID WC  . Chlorhexidine Gluconate Cloth  6 each Topical Daily  . diltiazem  180 mg Oral Daily  . [START ON 06/24/2020] epoetin (EPOGEN/PROCRIT) injection  10,000 Units Subcutaneous Weekly  . feeding supplement  1 Container Oral TID BM  . gentamicin cream  1 application Topical Daily  . multivitamin  1 tablet Oral QAC breakfast  . pantoprazole (PROTONIX) IV  40 mg Intravenous Q12H   Continuous Infusions: . 0.9 % NaCl with KCl 20 mEq / L 100 mL/hr at 06/23/20 1200  . dialysis solution 2.5% low-MG/low-CA         LOS: 2 days    Time spent: 25 minutes with > 50% spent in coordination of care and direct patient contact.    Ezekiel Slocumb, DO [facility]  06/23/2020, 1:14 PM    If 7PM-7AM, please contact night-coverage. How to contact the [facility] Attending or Consulting provider [place] or covering provider during after hours [place], for this patient?    1. Check the care team in [facility] and look for a) attending/consulting [facility] provider listed and b) the [facility] team listed 2. Log into [email or web address] and use [place]  universal password to access. If you do not have the password, please contact the hospital operator. 3. Locate the [facility] provider you are looking for under [facility] and page to a number that you can be directly reached. 4. If you still have difficulty reaching the provider, please page the [facility] (Director on Call) for the Hospitalists listed on amion for assistance.

## 2020-06-23 NOTE — Progress Notes (Signed)
[facility]  ROUNDING NOTE   Subjective:   Tracy Jennings was admitted to [facility] on 06/21/2020 for Hypocalcemia [E83.51] Hypokalemia [E87.6] Hypomagnesemia [E83.42] Chronic abdominal pain [R10.9, G89.29] Intractable vomiting [R11.10] Generalized weakness [R53.1] ESRD on peritoneal dialysis ([place]) [N18.6, Z99.2] Intractable nausea and vomiting [R11.2] Refractory nausea and vomiting [R11.2] Intractable diarrhea [R19.7]  Patient appears lethargic, lying in bed with eyes closed,arousable to call, but withdrawn.Per nursing report, oral intake stays poor, despite treating her nausea with PRN Zofran.Patient received peritoneal dialysis yesterday,but  again had low drain volume.      Objective:  Vital signs in last 24 hours:  Temp:  [97.8 F (36.6 C)-98.7 F (37.1 C)] 98.5 F (36.9 C) (10/12 1045) Pulse Rate:  [75-88] 88 (10/12 1045) Resp:  [16-20] 16 (10/12 1045) BP: (127-192)/(38-85) 192/77 (10/12 1045) SpO2:  [100 %] 100 % (10/12 1045)  Weight change:  Filed Weights   06/20/20 2343  Weight: 56.7 kg    Intake/Output: I/O last 3 completed shifts: In: 990.3 [P.O.:100; I.V.:132.8; Blood:380; IV Piggyback:377.5] Out: -    Intake/Output this shift:  No intake/output data recorded.  Physical Exam: General: Lethargic, withdrawn  Head: Normocephalic, atraumatic. Moist oral mucosal membranes  Eyes: Anicteric  Neck: Supple, trachea at midline  Lungs:  Respiration even,unlabored,Lungs clear to auscultation  Heart: S1S2, Regular rate and rhythm  Abdomen:  Soft, nontender,PD catheter site with dressing clean,dry and intact   Extremities: Trace peripheral edema.  Neurologic: Sleeping,arousable,lethargic,moving all four extremities  Skin: No acute lesions or rashes  Access: PD catheter    Basic Metabolic Panel: Recent Labs  Lab 06/18/20 0945 06/18/20 0945 06/21/20 0000 06/21/20 0502 06/22/20 0445 06/23/20 0534  NA 125*  --  129*  --  130* 130*  K 2.6*   --  2.9*  --  2.8* 3.3*  CL 86*  --  90*  --  93* 96*  CO2 21*  --  20*  --  25 19*  GLUCOSE 69*  --  74  --  117* 101*  BUN 67*  --  88*  --  90* 82*  CREATININE 16.79*  --  18.12*  --  17.15* 15.18*  CALCIUM 6.1*   < > 6.6*  --  7.0* 6.9*  MG  --   --   --  1.6* 2.0 1.9   < > = values in this interval not displayed.    Liver Function Tests: Recent Labs  Lab 06/18/20 0945 06/23/20 0534  AST 10* 13*  ALT <5 <5  ALKPHOS 51 42  BILITOT 0.9 0.7  PROT 6.2* 5.4*  ALBUMIN 2.3* 2.1*   Recent Labs  Lab 06/21/20 0000  LIPASE 46   No results for input(s): AMMONIA in the last 168 hours.  CBC: Recent Labs  Lab 06/18/20 0943 06/21/20 0000 06/22/20 0445 06/22/20 1805 06/23/20 0534  WBC 6.0 7.2 7.0  --  7.7  NEUTROABS 5.5 6.8  --   --   --   HGB 8.6* 9.7* 7.2* 6.5* 9.1*  HCT 23.8* 26.6* 20.3* 18.5* 25.5*  MCV 88.1 90.5 89.8  --  88.5  PLT 131* 164 130*  --  124*    Cardiac Enzymes: No results for input(s): CKTOTAL, CKMB, CKMBINDEX, TROPONINI in the last 168 hours.  BNP: Invalid input(s): POCBNP  CBG: Recent Labs  Lab 06/21/20 1602 06/22/20 0803 06/22/20 1108 06/22/20 1634 06/23/20 0749  GLUCAP 88 100* 121* 114* 58    Microbiology: Results for orders placed or performed during  the hospital encounter of 06/21/20  Respiratory Panel by RT PCR (Flu A&B, Covid) - Nasopharyngeal Swab     Status: None   Collection Time: 06/21/20 12:05 PM   Specimen: Nasopharyngeal Swab  Result Value Ref Range Status   SARS Coronavirus 2 by RT PCR NEGATIVE NEGATIVE Final    Comment: (NOTE) SARS-CoV-2 target nucleic acids are NOT DETECTED.  The SARS-CoV-2 RNA is generally detectable in upper respiratoy specimens during the acute phase of infection. The lowest concentration of SARS-CoV-2 viral copies this assay can detect is 131 copies/mL. A negative result does not preclude SARS-Cov-2 infection and should not be used as the sole basis for treatment or other patient management  decisions. A negative result may occur with  improper specimen collection/handling, submission of specimen other than nasopharyngeal swab, presence of viral mutation(s) within the areas targeted by this assay, and inadequate number of viral copies (<131 copies/mL). A negative result must be combined with clinical observations, patient history, and epidemiological information. The expected result is Negative.  Fact Sheet for Patients:  PinkCheek.be  Fact Sheet for Healthcare Providers:  GravelBags.it  This test is no t yet approved or cleared by the [place] FDA and  has been authorized for detection and/or diagnosis of SARS-CoV-2 by FDA under an Emergency Use Authorization (EUA). This EUA will remain  in effect (meaning this test can be used) for the duration of the COVID-19 declaration under Section 564(b)(1) of the Act, 21 U.S.C. section 360bbb-3(b)(1), unless the authorization is terminated or revoked sooner.     Influenza A by PCR NEGATIVE NEGATIVE Final   Influenza B by PCR NEGATIVE NEGATIVE Final    Comment: (NOTE) The Xpert Xpress SARS-CoV-2/FLU/RSV assay is intended as an aid in  the diagnosis of influenza from Nasopharyngeal swab specimens and  should not be used as a sole basis for treatment. Nasal washings and  aspirates are unacceptable for Xpert Xpress SARS-CoV-2/FLU/RSV  testing.  Fact Sheet for Patients: PinkCheek.be  Fact Sheet for Healthcare Providers: GravelBags.it  This test is not yet approved or cleared by the [place] FDA and  has been authorized for detection and/or diagnosis of SARS-CoV-2 by  FDA under an Emergency Use Authorization (EUA). This EUA will remain  in effect (meaning this test can be used) for the duration of the  Covid-19 declaration under Section 564(b)(1) of the Act, 21  U.S.C. section 360bbb-3(b)(1), unless the  authorization is  terminated or revoked. Performed at [facility], [address] [place], [place] [address]   C Difficile Quick Screen w PCR reflex     Status: None   Collection Time: 06/22/20 12:35 PM   Specimen: STOOL  Result Value Ref Range Status   C Diff antigen NEGATIVE NEGATIVE Final   C Diff toxin NEGATIVE NEGATIVE Final   C Diff interpretation No C. difficile detected.  Final    Comment: Performed at [facility], [address] [place], [place] [address]    Coagulation Studies: No results for input(s): LABPROT, INR in the last 72 hours.  Urinalysis: No results for input(s): COLORURINE, LABSPEC, PHURINE, GLUCOSEU, HGBUR, BILIRUBINUR, KETONESUR, PROTEINUR, UROBILINOGEN, NITRITE, LEUKOCYTESUR in the last 72 hours.  Invalid input(s): APPERANCEUR    Imaging: No results found.   Medications:   . 0.9 % NaCl with KCl 20 mEq / L    . dialysis solution 2.5% low-MG/low-CA     . calcitRIOL  0.25 mcg Oral Daily  . calcium acetate  1,334 mg Oral BID WC  .  carvedilol  25 mg Oral BID WC  . Chlorhexidine Gluconate Cloth  6 each Topical Daily  . diltiazem  180 mg Oral Daily  . [START ON 06/24/2020] epoetin (EPOGEN/PROCRIT) injection  10,000 Units Subcutaneous Weekly  . feeding supplement  1 Container Oral TID BM  . gentamicin cream  1 application Topical Daily  . multivitamin  1 tablet Oral QAC breakfast  . pantoprazole (PROTONIX) IV  40 mg Intravenous Q12H   acetaminophen **OR** acetaminophen, diphenoxylate-atropine, hydrALAZINE, labetalol, ondansetron **OR** ondansetron (ZOFRAN) IV  Assessment/ Plan:  [name] is a 63 y.o. black female with end stage renal disease on peritoneal dialysis, cervical cancer, hypertension, diabetes mellitus type II, peripheral vascular disease, hyperlipidemia, who is admitted to [facility] on 06/21/2020 for Hypocalcemia [E83.51] Hypokalemia [E87.6] Hypomagnesemia [E83.42] Chronic abdominal pain [R10.9,  G89.29] Intractable vomiting [R11.10] Generalized weakness [R53.1] ESRD on peritoneal dialysis ([place]) [N18.6, Z99.2] Intractable nausea and vomiting [R11.2] Refractory nausea and vomiting [R11.2] Intractable diarrhea [R19.7]  [facility] 66kg CCPD 9 hours, 4 exchanges, 2572mL fills with last fill of 2027mL with icodextran.   1. End Stage Renal Disease on peritoneal dialysis.  Patient received treatment again last night,but with low drain volume Patient appears hypovolemic Will hold peritoneal dialysis today IV fluid changed to normal saline with 20 mEq per potassium 100 mL/h  2. Hypertension: elevated. Home regimen of carvedilol and diltiazem.  Blood pressure readings still stays above goal Continue current antihypertensive regimen of Carvedilol, Diltiazem., PRN Hydralazine and PRN Labetalol  3. Hypokalemia: with GI losses. On oral potassium supplementation at home.  -Potassium better to 3.3 today - Receiving IV fluid with potassium chloride 20 mEq  4. Hyponatremia: secondary to GI losses and kidney failure. _Na+ remains at 130  _ Hypovolemic on examination.  - IV NS increased to 100 mL/h   5. Anemia with chronic kidney disease:  Hemoglobin 9.1 today Epogen restarted today, 10,000 units subcu weekly Stool positive for occult blood, plan to transfuse PRBC  7. Secondary Hyperparathyroidism with hypocalcemia: Calcium 6.9 today  Continue calcitriol and calcium acetate.    LOS: 2 Deena Shaub 10/12/202111:18 AM

## 2020-06-23 NOTE — Consult Note (Signed)
Tracy Antigua, MD [address], [place], [place], [place], [address] [address] [place], [place], [place], [address] Phone: [phone]  Fax: [phone]  Consultation  Referring Provider:     Dr. Arbutus Ped Primary Care Physician:  Sharyne Peach, MD Reason for Consultation:    Anemia, heme positive stool  Date of Admission:  06/21/2020 Date of Consultation:  06/23/2020         HPI:   Tracy Jennings is a 64 y.o. female with history of ESRD on peritoneal dialysis, cervical cancer status post chemo and radiation therapy, scheduled for brachytherapy at [facility] due to persistent disease, admitted with nausea vomiting diarrhea, poor oral intake, generalized pain which patient states is chronic and generalized weakness. GI consulted for anemia with heme positive stool. Patient does report dark stools at home. No hematemesis  Last colonoscopy 2019 with Dr. Alice Reichert. Please see procedure report for details. No recent upper endoscopy.  Patient states she is tired and wants to be left alone today and does not want any procedures today.  Past Medical History:  Diagnosis Date  . Anemia 04/2018   low iron. to be started on supplements  . Cervical cancer ([place])   . CKD (chronic kidney disease)    Stage IV  . Complication of anesthesia    receceived too much anesthesia, that she was in coma for a couple days   . COVID-19 virus detected 10/28/2019  . Diabetes mellitus without complication ([place])    type II  . ESRD (end stage renal disease) ([place])   . Heart murmur    followed as a child only  . HSIL (high grade squamous intraepithelial lesion) on Pap smear of cervix   . Hyperlipidemia associated with type 2 diabetes mellitus ([place])   . Hypertension   . Pancreatitis   . Peripheral vascular disease [place])     Past Surgical History:  Procedure Laterality Date  . AMPUTATION TOE Left 2013   2nd toe. tip of toe (toe nail was infected)  . AV FISTULA PLACEMENT Left 05/11/2018    Procedure: ARTERIOVENOUS (AV) FISTULA CREATION;  Surgeon: Katha Cabal, MD;  Location: [facility] ORS;  Service: Vascular;  Laterality: Left;  . CATARACT EXTRACTION    . CERVICAL CONIZATION W/BX N/A 04/08/2020   Procedure: CONIZATION CERVIX WITH BIOPSY;  Surgeon: Mellody Drown, MD;  Location: [facility] ORS;  Service: Gynecology;  Laterality: N/A;  . CHOLECYSTECTOMY  2014  . COLONOSCOPY    . COLONOSCOPY WITH PROPOFOL N/A 01/10/2018   Procedure: COLONOSCOPY WITH PROPOFOL;  Surgeon: Toledo, Benay Pike, MD;  Location: [facility] ENDOSCOPY;  Service: Gastroenterology;  Laterality: N/A;  . DIALYSIS/PERMA CATHETER INSERTION N/A 05/21/2018   Procedure: DIALYSIS/PERMA CATHETER INSERTION;  Surgeon: Algernon Huxley, MD;  Location: [facility];  Service: Cardiovascular;  Laterality: N/A;  . DIALYSIS/PERMA CATHETER REMOVAL N/A 04/11/2019   Procedure: DIALYSIS/PERMA CATHETER REMOVAL;  Surgeon: Algernon Huxley, MD;  Location: [facility];  Service: Cardiovascular;  Laterality: N/A;  . EYE SURGERY Bilateral 2018   cataract extractions  . IR IMAGING GUIDED PORT INSERTION  06/05/2020  . THROMBECTOMY W/ EMBOLECTOMY  05/11/2018   Procedure: THROMBECTOMY ARTERIOVENOUS FISTULA;  Surgeon: Katha Cabal, MD;  Location: [facility] ORS;  Service: Vascular;;  . TUBAL LIGATION  1984    Prior to Admission medications   Medication Sig Start Date End Date Taking? Authorizing Provider  calcitRIOL (ROCALTROL) 0.25 MCG capsule Take 0.25 mcg by mouth daily. 09/26/19  Yes [provider]  calcium acetate (PHOSLO) 667 MG capsule Take 1,334 mg by mouth 2 (two) times daily with a meal.  08/20/19  Yes [provider]  carvedilol (COREG) 25 MG tablet Take 25 mg by mouth 2 (two) times daily with a meal.    Yes [provider]  clopidogrel (PLAVIX) 75 MG tablet Take 75 mg by mouth daily.    Yes [provider]  DILT-XR 180 MG 24 hr capsule Take 180 mg by mouth daily.  05/01/20  Yes [provider]   diphenoxylate-atropine (LOMOTIL) 2.5-0.025 MG tablet Take 1 tablet by mouth 4 (four) times daily as needed for diarrhea or loose stools. 06/08/20  Yes Lloyd Huger, MD  lidocaine-prilocaine (EMLA) cream Apply 1 application topically as needed. Apply to port 1-2 hours prior to chemotherapy, cover with plastic wrap. 06/05/20  Yes Lloyd Huger, MD  loperamide (IMODIUM A-D) 2 MG tablet Take 2 mg by mouth 4 (four) times daily as needed for diarrhea or loose stools.   Yes [provider]  ondansetron (ZOFRAN) 4 MG tablet Take 4 mg by mouth 2 (two) times daily.  06/10/20  Yes [provider]  Potassium Chloride ER 20 MEQ TBCR Take 1 tablet by mouth daily.  05/12/20  Yes [provider]  prochlorperazine (COMPAZINE) 10 MG tablet Take 1 tablet (10 mg total) by mouth every 6 (six) hours as needed (Nausea or vomiting). 03/20/20  Yes Lloyd Huger, MD  diltiazem (DILACOR XR) 120 MG 24 hr capsule Take 120 mg by mouth daily.  Patient not taking: Reported on 06/23/2020    [provider]  diltiazem (CARDIZEM CD) 120 MG 24 hr capsule Take 120 mg by mouth daily. 09/26/19 10/29/19  [provider]    Family History  Problem Relation Age of Onset  . Stroke Mother   . Hypertension Mother   . Gout Mother   . Cancer Father   . Diabetes Sister   . Diabetes Maternal Grandmother   . Diabetes Maternal Grandfather   . Diabetes Paternal Grandmother   . Diabetes Paternal Grandfather      Social History   Tobacco Use  . Smoking status: Never Smoker  . Smokeless tobacco: Never Used  Vaping Use  . Vaping Use: Never used  Substance Use Topics  . Alcohol use: No  . Drug use: Never    Allergies as of 06/20/2020 - Review Complete 06/20/2020  Allergen Reaction Noted  . Amlodipine Swelling 04/24/2017  . Hctz [hydrochlorothiazide]  10/19/2015  . Heparin Other (See Comments) 05/21/2018  . Lmw heparin  10/30/2019  . Other Other (See Comments) 09/03/2018  .  Sulfa antibiotics Rash 10/19/2015  . Dairy aid [lactase]  05/04/2018  . Hydralazine Nausea Only 10/09/2018    Review of Systems:    All systems reviewed and negative except where noted in HPI.   Physical Exam:  Vital signs in last 24 hours: Vitals:   06/23/20 1045 06/23/20 1154 06/23/20 1156 06/23/20 1621  BP: (!) 192/77 (!) 203/89 (!) 194/72 (!) 181/70  Pulse: 88 87 84 90  Resp: 16 16  16   Temp: 98.5 F (36.9 C) 98.4 F (36.9 C)  98.3 F (36.8 C)  TempSrc:  Oral  Oral  SpO2: 100% 100%  99%  Weight:      Height:       Last BM Date: 06/23/20 General:   Pleasant, cooperative in NAD Head:  Normocephalic and atraumatic. Eyes:   No icterus.   Conjunctiva pink.  PERRLA. Ears:  Normal auditory acuity. Neck:  Supple; no masses or thyroidomegaly Lungs: Respirations even and unlabored. Lungs clear to auscultation bilaterally.   No wheezes, crackles, or rhonchi.  Abdomen:  Soft, nondistended, nontender. Normal bowel sounds. No appreciable masses or hepatomegaly.  No rebound or guarding.  Neurologic:  Alert and oriented x3;  grossly normal neurologically. Skin:  Intact without significant lesions or rashes. Cervical Nodes:  No significant cervical adenopathy. Psych:  Alert and cooperative. Normal affect.  LAB RESULTS: Recent Labs    06/22/20 0445 06/22/20 0445 06/22/20 1805 06/23/20 0534 06/23/20 1401  WBC 7.0  --   --  7.7 7.9  HGB 7.2*   < > 6.5* 9.1* 9.2*  HCT 20.3*   < > 18.5* 25.5* 25.6*  PLT 130*  --   --  124* 133*   < > = values in this interval not displayed.   BMET Recent Labs    06/21/20 0000 06/22/20 0445 06/23/20 0534  NA 129* 130* 130*  K 2.9* 2.8* 3.3*  CL 90* 93* 96*  CO2 20* 25 19*  GLUCOSE 74 117* 101*  BUN 88* 90* 82*  CREATININE 18.12* 17.15* 15.18*  CALCIUM 6.6* 7.0* 6.9*   LFT Recent Labs    06/23/20 0534  PROT 5.4*  ALBUMIN 2.1*  AST 13*  ALT <5  ALKPHOS 42  BILITOT 0.7  BILIDIR <0.1  IBILI NOT CALCULATED   PT/INR No results  for input(s): LABPROT, INR in the last 72 hours.  STUDIES: No results found.    Impression / Plan:   Tracy Jennings is a 64 y.o. y/o female with ESRD on peritoneal dialysis, history of cervical cancer status post chemo and radiation therapy and pending brachytherapy at [facility], with GI consulted for acute on chronic anemia with heme positive stool and patient reporting dark stools at home  Patient refused any procedures to be done today as she was tired  We will plan on upper endoscopy tomorrow for evaluation of above symptoms  PPI IV twice daily  Continue serial CBCs and transfuse PRN Avoid NSAIDs Maintain 2 large-bore IV lines Please page GI with any acute hemodynamic changes, or signs of active GI bleeding   Thank you for involving me in the care of this patient.      LOS: 2 days   Virgel Manifold, MD  06/23/2020, 6:09 PM

## 2020-06-24 ENCOUNTER — Encounter: Payer: Self-pay | Admitting: Internal Medicine

## 2020-06-24 ENCOUNTER — Inpatient Hospital Stay: Payer: BC Managed Care – PPO

## 2020-06-24 ENCOUNTER — Ambulatory Visit: Payer: BC Managed Care – PPO | Admitting: Oncology

## 2020-06-24 ENCOUNTER — Encounter: Payer: Self-pay | Admitting: Anesthesiology

## 2020-06-24 ENCOUNTER — Encounter
Admission: EM | Disposition: A | Discharge status: Home / Self Care | Payer: Self-pay | Source: Home / Self Care | Attending: Internal Medicine

## 2020-06-24 ENCOUNTER — Ambulatory Visit: Payer: BC Managed Care – PPO

## 2020-06-24 DIAGNOSIS — D62 Acute posthemorrhagic anemia: Secondary | ICD-10-CM | POA: Diagnosis not present

## 2020-06-24 DIAGNOSIS — R197 Diarrhea, unspecified: Secondary | ICD-10-CM

## 2020-06-24 DIAGNOSIS — R112 Nausea with vomiting, unspecified: Secondary | ICD-10-CM | POA: Diagnosis not present

## 2020-06-24 DIAGNOSIS — R1084 Generalized abdominal pain: Secondary | ICD-10-CM

## 2020-06-24 DIAGNOSIS — I10 Essential (primary) hypertension: Secondary | ICD-10-CM | POA: Diagnosis not present

## 2020-06-24 DIAGNOSIS — Z992 Dependence on renal dialysis: Secondary | ICD-10-CM

## 2020-06-24 LAB — CBC
HCT: 26.7 % — ABNORMAL LOW (ref 36.0–46.0)
HCT: 26.8 % — ABNORMAL LOW (ref 36.0–46.0)
Hemoglobin: 9.2 g/dL — ABNORMAL LOW (ref 12.0–15.0)
Hemoglobin: 9.2 g/dL — ABNORMAL LOW (ref 12.0–15.0)
MCH: 31.1 pg (ref 26.0–34.0)
MCH: 31.2 pg (ref 26.0–34.0)
MCHC: 34.3 g/dL (ref 30.0–36.0)
MCHC: 34.5 g/dL (ref 30.0–36.0)
MCV: 90.5 fL (ref 80.0–100.0)
MCV: 90.5 fL (ref 80.0–100.0)
Platelets: 139 10*3/uL — ABNORMAL LOW (ref 150–400)
Platelets: 144 10*3/uL — ABNORMAL LOW (ref 150–400)
RBC: 2.95 MIL/uL — ABNORMAL LOW (ref 3.87–5.11)
RBC: 2.96 MIL/uL — ABNORMAL LOW (ref 3.87–5.11)
RDW: 15.7 % — ABNORMAL HIGH (ref 11.5–15.5)
RDW: 15.9 % — ABNORMAL HIGH (ref 11.5–15.5)
WBC: 7.2 10*3/uL (ref 4.0–10.5)
WBC: 8.1 10*3/uL (ref 4.0–10.5)
nRBC: 0 % (ref 0.0–0.2)
nRBC: 0 % (ref 0.0–0.2)

## 2020-06-24 LAB — GLUCOSE, CAPILLARY
Glucose-Capillary: 73 mg/dL (ref 70–99)
Glucose-Capillary: 73 mg/dL (ref 70–99)
Glucose-Capillary: 74 mg/dL (ref 70–99)

## 2020-06-24 LAB — BASIC METABOLIC PANEL
Anion gap: 13 (ref 5–15)
BUN: 83 mg/dL — ABNORMAL HIGH (ref 8–23)
CO2: 18 mmol/L — ABNORMAL LOW (ref 22–32)
Calcium: 6.6 mg/dL — ABNORMAL LOW (ref 8.9–10.3)
Chloride: 102 mmol/L (ref 98–111)
Creatinine, Ser: 15.45 mg/dL — ABNORMAL HIGH (ref 0.44–1.00)
GFR, Estimated: 2 mL/min — ABNORMAL LOW (ref >60)
Glucose, Bld: 77 mg/dL (ref 70–99)
Potassium: 4.1 mmol/L (ref 3.5–5.1)
Sodium: 133 mmol/L — ABNORMAL LOW (ref 135–145)

## 2020-06-24 LAB — BPAM RBC
Blood Product Expiration Date: 202111062359
ISSUE DATE / TIME: 202110120106
Unit Type and Rh: 6200

## 2020-06-24 LAB — TYPE AND SCREEN
ABO/RH(D): A POS
Antibody Screen: NEGATIVE
Unit division: 0

## 2020-06-24 LAB — PHOSPHORUS: Phosphorus: 6.1 mg/dL — ABNORMAL HIGH (ref 2.5–4.6)

## 2020-06-24 SURGERY — EGD (ESOPHAGOGASTRODUODENOSCOPY)
Anesthesia: General

## 2020-06-24 MED ORDER — SODIUM CHLORIDE 0.9 % IV SOLN
150.0000 mg | Freq: Once | INTRAVENOUS | Status: AC
  Administered 2020-06-24: 150 mg via INTRAVENOUS
  Filled 2020-06-24: qty 5

## 2020-06-24 MED ORDER — CLONIDINE HCL 0.1 MG/24HR TD PTWK
0.1000 mg | MEDICATED_PATCH | TRANSDERMAL | Status: DC
  Filled 2020-06-24 (×2): qty 1

## 2020-06-24 NOTE — Progress Notes (Signed)
Tracy Antigua, MD [address] [place], [place], [place], [address] [address], [place], [place], [place], [address] Phone: [phone]  Fax: [phone]   Subjective: Pt refused EGD.  Denies any bleeding   Objective: Exam: Vital signs in last 24 hours: Vitals:   06/24/20 0521 06/24/20 0620 06/24/20 0752 06/24/20 1212  BP: (!) 197/78 (!) 160/74 (!) 189/73 (!) 179/71  Pulse: 99  92 96  Resp:    18  Temp:   98.6 F (37 C) 98 F (36.7 C)  TempSrc:      SpO2:   100% 100%  Weight:      Height:       Weight change:   Intake/Output Summary (Last 24 hours) at 06/24/2020 1228 Last data filed at 06/23/2020 2307 Gross per 24 hour  Intake 487.06 ml  Output --  Net 487.06 ml    General: No acute distress, AAO x3 Patient refuses physical exam   Lab Results: Lab Results  Component Value Date   WBC 7.2 06/24/2020   HGB 9.2 (L) 06/24/2020   HCT 26.8 (L) 06/24/2020   MCV 90.5 06/24/2020   PLT 139 (L) 06/24/2020   Micro Results: Recent Results (from the past 240 hour(s))  Respiratory Panel by RT PCR (Flu A&B, Covid) - Nasopharyngeal Swab     Status: None   Collection Time: 06/21/20 12:05 PM   Specimen: Nasopharyngeal Swab  Result Value Ref Range Status   SARS Coronavirus 2 by RT PCR NEGATIVE NEGATIVE Final    Comment: (NOTE) SARS-CoV-2 target nucleic acids are NOT DETECTED.  The SARS-CoV-2 RNA is generally detectable in upper respiratoy specimens during the acute phase of infection. The lowest concentration of SARS-CoV-2 viral copies this assay can detect is 131 copies/mL. A negative result does not preclude SARS-Cov-2 infection and should not be used as the sole basis for treatment or other patient management decisions. A negative result may occur with  improper specimen collection/handling, submission of specimen other than nasopharyngeal swab, presence of viral mutation(s) within the areas targeted by this assay, and inadequate number of viral  copies (<131 copies/mL). A negative result must be combined with clinical observations, patient history, and epidemiological information. The expected result is Negative.  Fact Sheet for Patients:  PinkCheek.be  Fact Sheet for Healthcare Providers:  GravelBags.it  This test is no t yet approved or cleared by the [place] FDA and  has been authorized for detection and/or diagnosis of SARS-CoV-2 by FDA under an Emergency Use Authorization (EUA). This EUA will remain  in effect (meaning this test can be used) for the duration of the COVID-19 declaration under Section 564(b)(1) of the Act, 21 U.S.C. section 360bbb-3(b)(1), unless the authorization is terminated or revoked sooner.     Influenza A by PCR NEGATIVE NEGATIVE Final   Influenza B by PCR NEGATIVE NEGATIVE Final    Comment: (NOTE) The Xpert Xpress SARS-CoV-2/FLU/RSV assay is intended as an aid in  the diagnosis of influenza from Nasopharyngeal swab specimens and  should not be used as a sole basis for treatment. Nasal washings and  aspirates are unacceptable for Xpert Xpress SARS-CoV-2/FLU/RSV  testing.  Fact Sheet for Patients: PinkCheek.be  Fact Sheet for Healthcare Providers: GravelBags.it  This test is not yet approved or cleared by the [place] FDA and  has been authorized for detection and/or diagnosis of SARS-CoV-2 by  FDA under an Emergency Use Authorization (EUA). This EUA will remain  in effect (meaning this test can be used) for  the duration of the  Covid-19 declaration under Section 564(b)(1) of the Act, 21  U.S.C. section 360bbb-3(b)(1), unless the authorization is  terminated or revoked. Performed at [facility], [address] [place], [place] [address]   C Difficile Quick Screen w PCR reflex     Status: None   Collection Time: 06/22/20 12:35 PM   Specimen: STOOL   Result Value Ref Range Status   C Diff antigen NEGATIVE NEGATIVE Final   C Diff toxin NEGATIVE NEGATIVE Final   C Diff interpretation No C. difficile detected.  Final    Comment: Performed at [facility], [address] [place], [place] [address]   Studies/Results: No results found. Medications:  Scheduled Meds: . calcitRIOL  0.25 mcg Oral Daily  . calcium acetate  1,334 mg Oral BID WC  . carvedilol  25 mg Oral BID WC  . Chlorhexidine Gluconate Cloth  6 each Topical Daily  . diltiazem  180 mg Oral Daily  . epoetin (EPOGEN/PROCRIT) injection  10,000 Units Subcutaneous Weekly  . feeding supplement  1 Container Oral TID BM  . gentamicin cream  1 application Topical Daily  . multivitamin  1 tablet Oral QAC breakfast  . pantoprazole (PROTONIX) IV  40 mg Intravenous Q12H   Continuous Infusions: . 0.9 % NaCl with KCl 20 mEq / L 100 mL/hr at 06/24/20 0754  . dialysis solution 2.5% low-MG/low-CA    . fosaprepitant (EMEND) IV infusion 150 mg 150 mg (06/24/20 1223)   PRN Meds:.acetaminophen **OR** acetaminophen, diphenoxylate-atropine, hydrALAZINE, labetalol, ondansetron **OR** ondansetron (ZOFRAN) IV, promethazine   Assessment: Principal Problem:   Intractable vomiting Active Problems:   ESRD (end stage renal disease) ([facility])   Generalized weakness   Cervical cancer ([facility])   Hypokalemia   Hyponatremia   Hypomagnesemia   Intractable nausea and vomiting   Refractory nausea and vomiting    Plan: Patient refused endoscopy this morning I discussed this with her and tried to answer her questions.  Patient is refusing physical exam, she specifically states she does not want endoscopy on this admission  I will sign off at this time, please page with any questions or concerns   LOS: 3 days   Tracy Antigua, MD 06/24/2020, 12:28 PM

## 2020-06-24 NOTE — Progress Notes (Signed)
RN attempted to give patient morning medication. Patient taking pills one at a time saying she cannot take all at once. Patient stated she would finish taking medications in room. RN spent 30 minutes in room waiting on pt to take and patient stated she would take them but she needs time. MD notified.  Thresa Ross, RN

## 2020-06-24 NOTE — Progress Notes (Signed)
Patient ID: Tracy Jennings, female   DOB: May 22, 1956, 64 y.o.   MRN: [id number] [facility] PROGRESS NOTE  GARY BULTMAN DGL:[id number] DOB: 16-Jan-1956 DOA: 06/21/2020 PCP: Sharyne Peach, MD  HPI/Subjective: Patient still having abdominal pain nausea and vomiting and some diarrhea.  Patient still nauseous despite nausea medications.  She has received chemotherapy recently.  Refused endoscopy today.  As per nursing staff very slow to take meds and very flat.  Patient deferred me calling any family to give an update.  Objective: Vitals:   06/24/20 0752 06/24/20 1212  BP: (!) 189/73 (!) 179/71  Pulse: 92 96  Resp:  18  Temp: 98.6 F (37 C) 98 F (36.7 C)  SpO2: 100% 100%    Intake/Output Summary (Last 24 hours) at 06/24/2020 1456 Last data filed at 06/23/2020 2307 Gross per 24 hour  Intake 487.06 ml  Output --  Net 487.06 ml   Filed Weights   06/20/20 2343  Weight: 56.7 kg    ROS: Review of Systems  Respiratory: Negative for cough and shortness of breath.   Cardiovascular: Negative for chest pain.  Gastrointestinal: Positive for abdominal pain, diarrhea, nausea and vomiting.   Exam: Physical Exam HENT:     Mouth/Throat:     Pharynx: No uvula swelling.  Eyes:     General: Lids are normal.     Conjunctiva/sclera: Conjunctivae normal.     Pupils: Pupils are equal, round, and reactive to light.  Cardiovascular:     Rate and Rhythm: Normal rate and regular rhythm.     Heart sounds: Normal heart sounds, S1 normal and S2 normal.  Pulmonary:     Breath sounds: Examination of the right-lower field reveals decreased breath sounds. Examination of the left-lower field reveals decreased breath sounds. Decreased breath sounds present. No wheezing, rhonchi or rales.  Abdominal:     Palpations: Abdomen is soft.     Tenderness: There is abdominal tenderness.  Musculoskeletal:     Right lower leg: No edema.     Left lower leg: No edema.  Skin:    General: Skin is  warm.     Findings: No rash.  Neurological:     Mental Status: She is alert and oriented to person, place, and time.       Data Reviewed: Basic Metabolic Panel: Recent Labs  Lab 06/18/20 0945 06/21/20 0000 06/21/20 0502 06/22/20 0445 06/23/20 0534 06/24/20 0634  NA 125* 129*  --  130* 130* 133*  K 2.6* 2.9*  --  2.8* 3.3* 4.1  CL 86* 90*  --  93* 96* 102  CO2 21* 20*  --  25 19* 18*  GLUCOSE 69* 74  --  117* 101* 77  BUN 67* 88*  --  90* 82* 83*  CREATININE 16.79* 18.12*  --  17.15* 15.18* 15.45*  CALCIUM 6.1* 6.6*  --  7.0* 6.9* 6.6*  MG  --   --  1.6* 2.0 1.9  --   PHOS  --   --   --   --   --  6.1*   Liver Function Tests: Recent Labs  Lab 06/18/20 0945 06/23/20 0534  AST 10* 13*  ALT <5 <5  ALKPHOS 51 42  BILITOT 0.9 0.7  PROT 6.2* 5.4*  ALBUMIN 2.3* 2.1*   Recent Labs  Lab 06/21/20 0000  LIPASE 46   CBC: Recent Labs  Lab 06/18/20 0943 06/18/20 0943 06/21/20 0000 06/22/20 0445 06/23/20 0534 06/23/20 1401 06/23/20 1850 06/24/20 0034 06/24/20 5188  WBC 6.0   < > 7.2   < > 7.7 7.9 8.8 8.1 7.2  NEUTROABS 5.5  --  6.8  --   --   --   --   --   --   HGB 8.6*   < > 9.7*   < > 9.1* 9.2* 10.2* 9.2* 9.2*  HCT 23.8*   < > 26.6*   < > 25.5* 25.6* 28.7* 26.7* 26.8*  MCV 88.1   < > 90.5   < > 88.5 88.6 88.9 90.5 90.5  PLT 131*   < > 164   < > 124* 133* 156 144* 139*   < > = values in this interval not displayed.    CBG: Recent Labs  Lab 06/23/20 1154 06/23/20 1619 06/23/20 2034 06/24/20 0752 06/24/20 1210  GLUCAP 79 76 79 73 73    Recent Results (from the past 240 hour(s))  Respiratory Panel by RT PCR (Flu A&B, Covid) - Nasopharyngeal Swab     Status: None   Collection Time: 06/21/20 12:05 PM   Specimen: Nasopharyngeal Swab  Result Value Ref Range Status   SARS Coronavirus 2 by RT PCR NEGATIVE NEGATIVE Final    Comment: (NOTE) SARS-CoV-2 target nucleic acids are NOT DETECTED.  The SARS-CoV-2 RNA is generally detectable in upper  respiratoy specimens during the acute phase of infection. The lowest concentration of SARS-CoV-2 viral copies this assay can detect is 131 copies/mL. A negative result does not preclude SARS-Cov-2 infection and should not be used as the sole basis for treatment or other patient management decisions. A negative result may occur with  improper specimen collection/handling, submission of specimen other than nasopharyngeal swab, presence of viral mutation(s) within the areas targeted by this assay, and inadequate number of viral copies (<131 copies/mL). A negative result must be combined with clinical observations, patient history, and epidemiological information. The expected result is Negative.  Fact Sheet for Patients:  PinkCheek.be  Fact Sheet for Healthcare Providers:  GravelBags.it  This test is no t yet approved or cleared by the [place] FDA and  has been authorized for detection and/or diagnosis of SARS-CoV-2 by FDA under an Emergency Use Authorization (EUA). This EUA will remain  in effect (meaning this test can be used) for the duration of the COVID-19 declaration under Section 564(b)(1) of the Act, 21 U.S.C. section 360bbb-3(b)(1), unless the authorization is terminated or revoked sooner.     Influenza A by PCR NEGATIVE NEGATIVE Final   Influenza B by PCR NEGATIVE NEGATIVE Final    Comment: (NOTE) The Xpert Xpress SARS-CoV-2/FLU/RSV assay is intended as an aid in  the diagnosis of influenza from Nasopharyngeal swab specimens and  should not be used as a sole basis for treatment. Nasal washings and  aspirates are unacceptable for Xpert Xpress SARS-CoV-2/FLU/RSV  testing.  Fact Sheet for Patients: PinkCheek.be  Fact Sheet for Healthcare Providers: GravelBags.it  This test is not yet approved or cleared by the [place] FDA and  has been  authorized for detection and/or diagnosis of SARS-CoV-2 by  FDA under an Emergency Use Authorization (EUA). This EUA will remain  in effect (meaning this test can be used) for the duration of the  Covid-19 declaration under Section 564(b)(1) of the Act, 21  U.S.C. section 360bbb-3(b)(1), unless the authorization is  terminated or revoked. Performed at [facility], [address], [place], [place] [address]   C Difficile Quick Screen w PCR reflex     Status: None   Collection  Time: 06/22/20 12:35 PM   Specimen: STOOL  Result Value Ref Range Status   C Diff antigen NEGATIVE NEGATIVE Final   C Diff toxin NEGATIVE NEGATIVE Final   C Diff interpretation No C. difficile detected.  Final    Comment: Performed at [facility], [address] [place], [place] [address]      Scheduled Meds: . calcitRIOL  0.25 mcg Oral Daily  . calcium acetate  1,334 mg Oral BID WC  . carvedilol  25 mg Oral BID WC  . Chlorhexidine Gluconate Cloth  6 each Topical Daily  . cloNIDine  0.1 mg Transdermal Weekly  . diltiazem  180 mg Oral Daily  . epoetin (EPOGEN/PROCRIT) injection  10,000 Units Subcutaneous Weekly  . feeding supplement  1 Container Oral TID BM  . gentamicin cream  1 application Topical Daily  . multivitamin  1 tablet Oral QAC breakfast  . pantoprazole (PROTONIX) IV  40 mg Intravenous Q12H   Continuous Infusions: . dialysis solution 2.5% low-MG/low-CA      Assessment/Plan:  1. Persistent nausea vomiting diarrhea and abdominal pain.  Could be secondary to chemotherapy.  Asked nephrology team to send off fluid from the abdomen to rule out SBP.  Spoke with pharmacist to give a dose of nausea medication one-time dose and continue Zofran as needed.  Patient refused endoscopy.  Continue empiric Protonix.  C. difficile negative. 2. Accelerated hypertension.  Slow with medications.  Will add clonidine patch and as needed IV medications. 3. Acute blood loss anemia.  Continue to  monitor closely.  Today's hemoglobin 9.2. 4. End-stage renal disease on peritoneal dialysis.  Continue dialysis as per nephrology. 5. Hypokalemia, hypomagnesemia and hyponatremia secondary to GI losses.  Potassium in the normal range will stop IV fluids at this point. 6. Cervical cancer follow-up as outpatient 7. Nursing staff noted the patient was flat in affect.  Since a meeting of patient for the first time today I will reassess tomorrow.  Spoke with nephrology team and one of the dialysis nurse knows her well and will speak with her today.        Code Status:     Code Status Orders  (From admission, onward)         Start     Ordered   06/21/20 0818  Full code  Continuous        06/21/20 0819        Code Status History    Date Active Date Inactive Code Status Order ID Comments User Context   04/08/2020 0959 04/08/2020 1627 Full Code [id number]  Mellody Drown, MD Inpatient   11/03/2019 2104 11/09/2019 2032 Full Code [id number]  Athena Masse, MD ED   10/29/2019 0243 11/01/2019 2028 Full Code [id number]  Mansy, Arvella Merles, MD ED   05/11/2018 2017 05/25/2018 2211 Full Code [id number]  Erlene Quan, NP Inpatient   05/11/2018 1711 05/11/2018 2017 Full Code [id number]  Delana Meyer Dolores Lory, MD Inpatient   11/01/2017 0345 11/03/2017 1655 Full Code [id number]  Lance Coon, MD Inpatient   Advance Care Planning Activity     Family Communication: Deferred me calling family Disposition Plan: Status is: Inpatient  Dispo:  Patient From: Home  Planned Disposition: Home  Expected discharge date: 06/26/20 at the earliest.  Will need to tolerate solid food prior to disposition.  Medically stable for discharge: No  Consultants:  Nephrology   Time spent: 28 minutes  [place]

## 2020-06-24 NOTE — Progress Notes (Signed)
[facility]  ROUNDING NOTE   Subjective:   [name] was admitted to [facility] on 06/21/2020 for Hypocalcemia [E83.51] Hypokalemia [E87.6] Hypomagnesemia [E83.42] Chronic abdominal pain [R10.9, G89.29] Intractable vomiting [R11.10] Generalized weakness [R53.1] ESRD on peritoneal dialysis ([place]) [N18.6, Z99.2] Intractable nausea and vomiting [R11.2] Refractory nausea and vomiting [R11.2] Intractable diarrhea [R19.7]  Patient awake,more alert, sitting up. She reports nausea,diarrhea and abdominal discomfort.Appetite and oral intake stays poor.     Objective:  Vital signs in last 24 hours:  Temp:  [97.7 F (36.5 C)-98.6 F (37 C)] 98.6 F (37 C) (10/13 0752) Pulse Rate:  [84-100] 92 (10/13 0752) Resp:  [16-18] 18 (10/13 0515) BP: (138-209)/(67-89) 189/73 (10/13 0752) SpO2:  [96 %-100 %] 100 % (10/13 0752)  Weight change:  Filed Weights   06/20/20 2343  Weight: 56.7 kg    Intake/Output: I/O last 3 completed shifts: In: 867.1 [I.V.:487.1; Blood:380] Out: -    Intake/Output this shift:  No intake/output data recorded.  Physical Exam: General: In no acute distress  Head: Normocephalic, atraumatic. Moist oral mucosal membranes  Eyes:  Sclera and conjunctiva clear  Neck: Supple, trachea at midline  Lungs:  Lungs clear to auscultation  Heart: S1S2, Regular rate and rhythm  Abdomen:  Soft,PD catheter site with dressing clean,dry and intact   Extremities:  No peripheral edema.  Neurologic: Oriented x 3  Skin: No acute lesions or rashes  Access: PD catheter    Basic Metabolic Panel: Recent Labs  Lab 06/18/20 0945 06/18/20 0945 06/21/20 0000 06/21/20 0000 06/21/20 0502 06/22/20 0445 06/23/20 0534 06/24/20 0634  NA 125*  --  129*  --   --  130* 130* 133*  K 2.6*  --  2.9*  --   --  2.8* 3.3* 4.1  CL 86*  --  90*  --   --  93* 96* 102  CO2 21*  --  20*  --   --  25 19* 18*  GLUCOSE 69*  --  74  --   --  117* 101* 77  BUN 67*  --  88*  --    --  90* 82* 83*  CREATININE 16.79*  --  18.12*  --   --  17.15* 15.18* 15.45*  CALCIUM 6.1*   < > 6.6*   < >  --  7.0* 6.9* 6.6*  MG  --   --   --   --  1.6* 2.0 1.9  --   PHOS  --   --   --   --   --   --   --  6.1*   < > = values in this interval not displayed.    Liver Function Tests: Recent Labs  Lab 06/18/20 0945 06/23/20 0534  AST 10* 13*  ALT <5 <5  ALKPHOS 51 42  BILITOT 0.9 0.7  PROT 6.2* 5.4*  ALBUMIN 2.3* 2.1*   Recent Labs  Lab 06/21/20 0000  LIPASE 46   No results for input(s): AMMONIA in the last 168 hours.  CBC: Recent Labs  Lab 06/18/20 0943 06/18/20 0943 06/21/20 0000 06/22/20 0445 06/23/20 0534 06/23/20 1401 06/23/20 1850 06/24/20 0034 06/24/20 0634  WBC 6.0   < > 7.2   < > 7.7 7.9 8.8 8.1 7.2  NEUTROABS 5.5  --  6.8  --   --   --   --   --   --   HGB 8.6*   < > 9.7*   < > 9.1*  9.2* 10.2* 9.2* 9.2*  HCT 23.8*   < > 26.6*   < > 25.5* 25.6* 28.7* 26.7* 26.8*  MCV 88.1   < > 90.5   < > 88.5 88.6 88.9 90.5 90.5  PLT 131*   < > 164   < > 124* 133* 156 144* 139*   < > = values in this interval not displayed.    Cardiac Enzymes: No results for input(s): CKTOTAL, CKMB, CKMBINDEX, TROPONINI in the last 168 hours.  BNP: Invalid input(s): POCBNP  CBG: Recent Labs  Lab 06/23/20 0749 06/23/20 1154 06/23/20 1619 06/23/20 2034 06/24/20 0752  GLUCAP 84 79 76 79 73    Microbiology: Results for orders placed or performed during the hospital encounter of 06/21/20  Respiratory Panel by RT PCR (Flu A&B, Covid) - Nasopharyngeal Swab     Status: None   Collection Time: 06/21/20 12:05 PM   Specimen: Nasopharyngeal Swab  Result Value Ref Range Status   SARS Coronavirus 2 by RT PCR NEGATIVE NEGATIVE Final    Comment: (NOTE) SARS-CoV-2 target nucleic acids are NOT DETECTED.  The SARS-CoV-2 RNA is generally detectable in upper respiratoy specimens during the acute phase of infection. The lowest concentration of SARS-CoV-2 viral copies this assay can  detect is 131 copies/mL. A negative result does not preclude SARS-Cov-2 infection and should not be used as the sole basis for treatment or other patient management decisions. A negative result may occur with  improper specimen collection/handling, submission of specimen other than nasopharyngeal swab, presence of viral mutation(s) within the areas targeted by this assay, and inadequate number of viral copies (<131 copies/mL). A negative result must be combined with clinical observations, patient history, and epidemiological information. The expected result is Negative.  Fact Sheet for Patients:  PinkCheek.be  Fact Sheet for Healthcare Providers:  GravelBags.it  This test is no t yet approved or cleared by the [place] FDA and  has been authorized for detection and/or diagnosis of SARS-CoV-2 by FDA under an Emergency Use Authorization (EUA). This EUA will remain  in effect (meaning this test can be used) for the duration of the COVID-19 declaration under Section 564(b)(1) of the Act, 21 U.S.C. section 360bbb-3(b)(1), unless the authorization is terminated or revoked sooner.     Influenza A by PCR NEGATIVE NEGATIVE Final   Influenza B by PCR NEGATIVE NEGATIVE Final    Comment: (NOTE) The Xpert Xpress SARS-CoV-2/FLU/RSV assay is intended as an aid in  the diagnosis of influenza from Nasopharyngeal swab specimens and  should not be used as a sole basis for treatment. Nasal washings and  aspirates are unacceptable for Xpert Xpress SARS-CoV-2/FLU/RSV  testing.  Fact Sheet for Patients: PinkCheek.be  Fact Sheet for Healthcare Providers: GravelBags.it  This test is not yet approved or cleared by the [place] FDA and  has been authorized for detection and/or diagnosis of SARS-CoV-2 by  FDA under an Emergency Use Authorization (EUA). This EUA will remain  in  effect (meaning this test can be used) for the duration of the  Covid-19 declaration under Section 564(b)(1) of the Act, 21  U.S.C. section 360bbb-3(b)(1), unless the authorization is  terminated or revoked. Performed at [facility], [address] [place], [place] [address]   C Difficile Quick Screen w PCR reflex     Status: None   Collection Time: 06/22/20 12:35 PM   Specimen: STOOL  Result Value Ref Range Status   C Diff antigen NEGATIVE NEGATIVE Final   C Diff toxin  NEGATIVE NEGATIVE Final   C Diff interpretation No C. difficile detected.  Final    Comment: Performed at [facility], [address] [place], [place] [address]    Coagulation Studies: No results for input(s): LABPROT, INR in the last 72 hours.  Urinalysis: No results for input(s): COLORURINE, LABSPEC, PHURINE, GLUCOSEU, HGBUR, BILIRUBINUR, KETONESUR, PROTEINUR, UROBILINOGEN, NITRITE, LEUKOCYTESUR in the last 72 hours.  Invalid input(s): APPERANCEUR    Imaging: No results found.   Medications:   . 0.9 % NaCl with KCl 20 mEq / L 100 mL/hr at 06/24/20 0754  . dialysis solution 2.5% low-MG/low-CA    . fosaprepitant (EMEND) IV infusion 150 mg     . calcitRIOL  0.25 mcg Oral Daily  . calcium acetate  1,334 mg Oral BID WC  . carvedilol  25 mg Oral BID WC  . Chlorhexidine Gluconate Cloth  6 each Topical Daily  . diltiazem  180 mg Oral Daily  . epoetin (EPOGEN/PROCRIT) injection  10,000 Units Subcutaneous Weekly  . feeding supplement  1 Container Oral TID BM  . gentamicin cream  1 application Topical Daily  . multivitamin  1 tablet Oral QAC breakfast  . pantoprazole (PROTONIX) IV  40 mg Intravenous Q12H   acetaminophen **OR** acetaminophen, diphenoxylate-atropine, hydrALAZINE, labetalol, ondansetron **OR** ondansetron (ZOFRAN) IV, promethazine  Assessment/ Plan:  [name] is a 64 y.o. black female with end stage renal disease on peritoneal dialysis, cervical cancer,  hypertension, diabetes mellitus type II, peripheral vascular disease, hyperlipidemia, who is admitted to [facility] on 06/21/2020 for Hypocalcemia [E83.51] Hypokalemia [E87.6] Hypomagnesemia [E83.42] Chronic abdominal pain [R10.9, G89.29] Intractable vomiting [R11.10] Generalized weakness [R53.1] ESRD on peritoneal dialysis ([place]) [N18.6, Z99.2] Intractable nausea and vomiting [R11.2] Refractory nausea and vomiting [R11.2] Intractable diarrhea [R19.7]  [facility] 66kg CCPD 9 hours, 4 exchanges, 2549mL fills with last fill of 2030mL with icodextran.   1. End Stage Renal Disease on peritoneal dialysis.  Peritoneal dialysis held last night. Patient reports feeling better. Volume and electrolyte status better today Will hold off dialysis again tonight IVF Normal saline with 20 mEq per potassium 100 mL/h on flow  2. Hypertension:  Home regimen of carvedilol and diltiazem.  Blood pressure readings elevated Patient reports nausea, encourage taking PO antihypertensive medications Continue PRN Hydralazine and PRN Labetalol  3. Hypokalemia: with GI losses. On oral potassium supplementation at home.  -Potassium normalized to 4.1 today   4. Hyponatremia: secondary to GI losses and kidney failure. _ Sodium getting better, 133 today -Continue  IVF  NS   5. Anemia with chronic kidney disease:  Hemoglobin 9.2 today Epogen restarted today, 10,000 units subcu weekly Stool positive for occult blood Patient received PRBC transfusion yesterday Gastroenterology team planning for endoscopy today  7. Secondary Hyperparathyroidism with hypocalcemia: Calcium 6.6 Phosphorus 6.1 Continue calcitriol and calcium acetate.    LOS: 3 Miki Labuda 10/13/202110:25 AM

## 2020-06-24 NOTE — Progress Notes (Signed)
PT Cancellation Note  Patient Details Name: Tracy Jennings MRN: [id number] DOB: 04-16-1956   Cancelled Treatment:    Reason Eval/Treat Not Completed: Patient declined, no reason specified   Patient refused stating, "Come back tomorrow, I am not doing anything today." Patient is scheduled for EGD today but has not signed consent forms. Will re-attempt tomorrow when patient ready and appropriate.  Thank you for this referral.    Ahsan Esterline PT, DPT 06/24/2020, 10:39 AM

## 2020-06-24 NOTE — Progress Notes (Signed)
Patient refused EGD procedure. Dr. Bonna Gains and Dr. Earleen Newport made aware.   Thresa Ross, RN

## 2020-06-25 ENCOUNTER — Ambulatory Visit: Payer: BC Managed Care – PPO

## 2020-06-25 ENCOUNTER — Telehealth: Payer: Self-pay

## 2020-06-25 ENCOUNTER — Ambulatory Visit: Payer: BC Managed Care – PPO | Admitting: Oncology

## 2020-06-25 DIAGNOSIS — Z515 Encounter for palliative care: Secondary | ICD-10-CM

## 2020-06-25 DIAGNOSIS — Z992 Dependence on renal dialysis: Secondary | ICD-10-CM | POA: Diagnosis not present

## 2020-06-25 DIAGNOSIS — Z7189 Other specified counseling: Secondary | ICD-10-CM

## 2020-06-25 DIAGNOSIS — R1084 Generalized abdominal pain: Secondary | ICD-10-CM | POA: Diagnosis not present

## 2020-06-25 DIAGNOSIS — I10 Essential (primary) hypertension: Secondary | ICD-10-CM | POA: Diagnosis not present

## 2020-06-25 DIAGNOSIS — R112 Nausea with vomiting, unspecified: Secondary | ICD-10-CM | POA: Diagnosis not present

## 2020-06-25 DIAGNOSIS — D62 Acute posthemorrhagic anemia: Secondary | ICD-10-CM | POA: Diagnosis not present

## 2020-06-25 DIAGNOSIS — N186 End stage renal disease: Secondary | ICD-10-CM | POA: Diagnosis not present

## 2020-06-25 LAB — GLUCOSE, CAPILLARY
Glucose-Capillary: 71 mg/dL (ref 70–99)
Glucose-Capillary: 64 mg/dL — ABNORMAL LOW (ref 70–99)

## 2020-06-25 MED ORDER — SODIUM CHLORIDE 0.9 % IV SOLN
1.0000 g | INTRAVENOUS | Status: AC
  Administered 2020-06-25 – 2020-06-29 (×5): 1 g via INTRAVENOUS
  Filled 2020-06-25 (×5): qty 1

## 2020-06-25 MED ORDER — METRONIDAZOLE IN NACL 5-0.79 MG/ML-% IV SOLN
500.0000 mg | Freq: Three times a day (TID) | INTRAVENOUS | Status: AC
  Administered 2020-06-25 – 2020-06-30 (×15): 500 mg via INTRAVENOUS
  Filled 2020-06-25 (×15): qty 100

## 2020-06-25 NOTE — Progress Notes (Signed)
PT Cancellation Note  Patient Details Name: Tracy Jennings MRN: [id number] DOB: 12-Dec-1955   Cancelled Treatment:    Reason Eval/Treat Not Completed: Other (comment). Pt sleeping upon entrance, wakes easily to voice. PT explained role and goals of therapy, pt declined any mobility attempts at this time. When asked, pt reported she has been moving with nursing staff to bathroom and to chair in room. Despite education and gentle encouragement pt continued to decline PT. Per RN pt now pending palliative consult. PT to re-attempt per depending on pt appropriateness and/or participation with rehab services.  Lieutenant Diego PT, DPT 10:02 AM,06/25/20

## 2020-06-25 NOTE — Progress Notes (Signed)
[facility]  ROUNDING NOTE   Subjective:   Tracy Jennings was admitted to [facility] on 06/21/2020 for Hypocalcemia [E83.51] Hypokalemia [E87.6] Hypomagnesemia [E83.42] Chronic abdominal pain [R10.9, G89.29] Intractable vomiting [R11.10] Generalized weakness [R53.1] ESRD on peritoneal dialysis ([place]) [N18.6, Z99.2] Intractable nausea and vomiting [R11.2] Refractory nausea and vomiting [R11.2] Intractable diarrhea [R19.7]  Patient  resting in bed, with her husband at the bedside,still appears volume depleted. Oral intake stays poor,reports nausea and anorexia.She agreed for a short session of peritoneal dialysis today.     Objective:  Vital signs in last 24 hours:  Temp:  [98 F (36.7 C)-98.6 F (37 C)] 98.1 F (36.7 C) (10/14 0601) Pulse Rate:  [96-102] 96 (10/14 0601) Resp:  [15-18] 15 (10/14 0601) BP: (159-179)/(65-74) 166/68 (10/14 0601) SpO2:  [99 %-100 %] 100 % (10/14 0601)  Weight change:  Filed Weights   06/20/20 2343  Weight: 56.7 kg    Intake/Output: I/O last 3 completed shifts: In: 360 [P.O.:360] Out: -    Intake/Output this shift:  No intake/output data recorded.  Physical Exam: General: In no acute distress,flat affect  Head:  Oral mucosal membranes appears dry  Eyes:  Sclerae and conjunctivae clear  Neck: Supple, trachea at midline  Lungs:  Respirations even,unlabored,Lungs clear to auscultation  Heart: Regular rate and rhythm  Abdomen:  Soft,non distended, tenderness+  Extremities:  No peripheral edema.  Neurologic:  Awake, alert,oriented  Skin: No acute lesions or rashes  Access: PD catheter    Basic Metabolic Panel: Recent Labs  Lab 06/21/20 0000 06/21/20 0000 06/21/20 0502 06/22/20 0445 06/23/20 0534 06/24/20 0634  NA 129*  --   --  130* 130* 133*  K 2.9*  --   --  2.8* 3.3* 4.1  CL 90*  --   --  93* 96* 102  CO2 20*  --   --  25 19* 18*  GLUCOSE 74  --   --  117* 101* 77  BUN 88*  --   --  90* 82* 83*  CREATININE  18.12*  --   --  17.15* 15.18* 15.45*  CALCIUM 6.6*   < >  --  7.0* 6.9* 6.6*  MG  --   --  1.6* 2.0 1.9  --   PHOS  --   --   --   --   --  6.1*   < > = values in this interval not displayed.    Liver Function Tests: Recent Labs  Lab 06/23/20 0534  AST 13*  ALT <5  ALKPHOS 42  BILITOT 0.7  PROT 5.4*  ALBUMIN 2.1*   Recent Labs  Lab 06/21/20 0000  LIPASE 46   No results for input(s): AMMONIA in the last 168 hours.  CBC: Recent Labs  Lab 06/21/20 0000 06/22/20 0445 06/23/20 0534 06/23/20 1401 06/23/20 1850 06/24/20 0034 06/24/20 0634  WBC 7.2   < > 7.7 7.9 8.8 8.1 7.2  NEUTROABS 6.8  --   --   --   --   --   --   HGB 9.7*   < > 9.1* 9.2* 10.2* 9.2* 9.2*  HCT 26.6*   < > 25.5* 25.6* 28.7* 26.7* 26.8*  MCV 90.5   < > 88.5 88.6 88.9 90.5 90.5  PLT 164   < > 124* 133* 156 144* 139*   < > = values in this interval not displayed.    Cardiac Enzymes: No results for input(s): CKTOTAL, CKMB, CKMBINDEX, TROPONINI in the last  168 hours.  BNP: Invalid input(s): POCBNP  CBG: Recent Labs  Lab 06/23/20 1619 06/23/20 2034 06/24/20 0752 06/24/20 1210 06/24/20 1635  GLUCAP 76 79 73 73 74    Microbiology: Results for orders placed or performed during the hospital encounter of 06/21/20  Respiratory Panel by RT PCR (Flu A&B, Covid) - Nasopharyngeal Swab     Status: None   Collection Time: 06/21/20 12:05 PM   Specimen: Nasopharyngeal Swab  Result Value Ref Range Status   SARS Coronavirus 2 by RT PCR NEGATIVE NEGATIVE Final    Comment: (NOTE) SARS-CoV-2 target nucleic acids are NOT DETECTED.  The SARS-CoV-2 RNA is generally detectable in upper respiratoy specimens during the acute phase of infection. The lowest concentration of SARS-CoV-2 viral copies this assay can detect is 131 copies/mL. A negative result does not preclude SARS-Cov-2 infection and should not be used as the sole basis for treatment or other patient management decisions. A negative result may  occur with  improper specimen collection/handling, submission of specimen other than nasopharyngeal swab, presence of viral mutation(s) within the areas targeted by this assay, and inadequate number of viral copies (<131 copies/mL). A negative result must be combined with clinical observations, patient history, and epidemiological information. The expected result is Negative.  Fact Sheet for Patients:  PinkCheek.be  Fact Sheet for Healthcare Providers:  GravelBags.it  This test is no t yet approved or cleared by the [place] FDA and  has been authorized for detection and/or diagnosis of SARS-CoV-2 by FDA under an Emergency Use Authorization (EUA). This EUA will remain  in effect (meaning this test can be used) for the duration of the COVID-19 declaration under Section 564(b)(1) of the Act, 21 U.S.C. section 360bbb-3(b)(1), unless the authorization is terminated or revoked sooner.     Influenza A by PCR NEGATIVE NEGATIVE Final   Influenza B by PCR NEGATIVE NEGATIVE Final    Comment: (NOTE) The Xpert Xpress SARS-CoV-2/FLU/RSV assay is intended as an aid in  the diagnosis of influenza from Nasopharyngeal swab specimens and  should not be used as a sole basis for treatment. Nasal washings and  aspirates are unacceptable for Xpert Xpress SARS-CoV-2/FLU/RSV  testing.  Fact Sheet for Patients: PinkCheek.be  Fact Sheet for Healthcare Providers: GravelBags.it  This test is not yet approved or cleared by the [place] FDA and  has been authorized for detection and/or diagnosis of SARS-CoV-2 by  FDA under an Emergency Use Authorization (EUA). This EUA will remain  in effect (meaning this test can be used) for the duration of the  Covid-19 declaration under Section 564(b)(1) of the Act, 21  U.S.C. section 360bbb-3(b)(1), unless the authorization is  terminated or  revoked. Performed at [facility], [address] [place], [place] [address]   C Difficile Quick Screen w PCR reflex     Status: None   Collection Time: 06/22/20 12:35 PM   Specimen: STOOL  Result Value Ref Range Status   C Diff antigen NEGATIVE NEGATIVE Final   C Diff toxin NEGATIVE NEGATIVE Final   C Diff interpretation No C. difficile detected.  Final    Comment: Performed at [facility], [address] [place], [place] [address]    Coagulation Studies: No results for input(s): LABPROT, INR in the last 72 hours.  Urinalysis: No results for input(s): COLORURINE, LABSPEC, PHURINE, GLUCOSEU, HGBUR, BILIRUBINUR, KETONESUR, PROTEINUR, UROBILINOGEN, NITRITE, LEUKOCYTESUR in the last 72 hours.  Invalid input(s): APPERANCEUR    Imaging: No results found.   Medications:   .  dialysis solution 2.5% low-MG/low-CA     . calcitRIOL  0.25 mcg Oral Daily  . calcium acetate  1,334 mg Oral BID WC  . carvedilol  25 mg Oral BID WC  . Chlorhexidine Gluconate Cloth  6 each Topical Daily  . cloNIDine  0.1 mg Transdermal Weekly  . diltiazem  180 mg Oral Daily  . epoetin (EPOGEN/PROCRIT) injection  10,000 Units Subcutaneous Weekly  . feeding supplement  1 Container Oral TID BM  . gentamicin cream  1 application Topical Daily  . multivitamin  1 tablet Oral QAC breakfast  . pantoprazole (PROTONIX) IV  40 mg Intravenous Q12H   acetaminophen **OR** acetaminophen, diphenoxylate-atropine, hydrALAZINE, labetalol, ondansetron **OR** ondansetron (ZOFRAN) IV, promethazine  Assessment/ Plan:  Tracy Jennings is a 64 y.o. black female with end stage renal disease on peritoneal dialysis, cervical cancer, hypertension, diabetes mellitus type II, peripheral vascular disease, hyperlipidemia, who is admitted to [facility] on 06/21/2020 for Hypocalcemia [E83.51] Hypokalemia [E87.6] Hypomagnesemia [E83.42] Chronic abdominal pain [R10.9, G89.29] Intractable vomiting  [R11.10] Generalized weakness [R53.1] ESRD on peritoneal dialysis ([place]) [N18.6, Z99.2] Intractable nausea and vomiting [R11.2] Refractory nausea and vomiting [R11.2] Intractable diarrhea [R19.7]  [facility] 66kg CCPD 9 hours, 4 exchanges, 2527mL fills with last fill of 209mL with icodextran.   1. End Stage Renal Disease on peritoneal dialysis.  Patient agreed for a short session of peritoneal dialysis today Will check peritoneal cell count and culture to evaluate for peritonitis  2. Hypertension:  Home regimen of carvedilol and diltiazem.  Blood pressure readings still stays slightly above the goal Patient slow to take her PO medicines Continue PRN Hydralazine and PRN Labetalol  3. Hypokalemia: with GI losses. On oral potassium supplementation at home.  -Potassium normalized to 4.1 on 06/24/20 --Patient refused lab draws today   4. Hyponatremia: secondary to GI losses and kidney failure. _ Sodium 133 on 06/24/20 -Patient refused labs today   5. Anemia with chronic kidney disease:  Hemoglobin 9.2 yesterday Epogen restarted today, 10,000 units subcu weekly Stool positive for occult blood Patient received PRBC transfusion on 06/23/20 Gastroenterology team was planning for endoscopy,but patient refused.  7. Secondary Hyperparathyroidism with hypocalcemia: Calcium 6.6 Phosphorus 6.1  from 06/24/20  Continue calcitriol and calcium acetate.    LOS: 4 Tracy Jennings 10/14/202111:12 AM

## 2020-06-25 NOTE — Telephone Encounter (Signed)
Message sent to Dr. Christel Mormon and Dr. Fransisca Connors to notify that Tracy Jennings has been in the hospital since 10/10. She is scheduled to start HDR at [facility] with Dr. Christel Mormon, 10/15. Discharge not anticipated today per notes.

## 2020-06-25 NOTE — Progress Notes (Signed)
PT Cancellation Note  Patient Details Name: Tracy Jennings MRN: [id number] DOB: 19-Nov-1955   Cancelled Treatment:    Reason Eval/Treat Not Completed: Patient declined, no reason specified On arrival to room pt laying in bed with covers pulled over to her head; introduced my self as a PT and encouraged some activity (offered many different scenarios and levels of activity) to no avail.  She simple repeated some version of "I just don't feel good today, I can't."  At one point PT asked her to at least pull covers down enough to make eye contact during this conversation - she responded by not responding, leaving head covered.  Pt "agreed" to consider working with PT tomorrow if she feels better...  Kreg Shropshire, DPT 06/25/2020, 2:39 PM

## 2020-06-25 NOTE — Progress Notes (Signed)
Pharmacy Antibiotic Note  LYNISE PORR is a 64 y.o. female admitted on 06/21/2020 with possible abdominal infection.  Pharmacy has been consulted for Cefepime dosing. -Peritoneal Dialysis patient  Plan: Start Cefepime 1 gram IV q24h (PD dosing)   Height: 5\' 3"  (160 cm) Weight: 56.7 kg (125 lb) IBW/kg (Calculated) : 52.4  Temp (24hrs), Avg:98.2 F (36.8 C), Min:97.4 F (36.3 C), Max:98.6 F (37 C)  Recent Labs  Lab 06/21/20 0000 06/21/20 0000 06/22/20 0445 06/22/20 0445 06/23/20 0534 06/23/20 1401 06/23/20 1850 06/24/20 0034 06/24/20 0634  WBC 7.2   < > 7.0   < > 7.7 7.9 8.8 8.1 7.2  CREATININE 18.12*  --  17.15*  --  15.18*  --   --   --  15.45*   < > = values in this interval not displayed.    Estimated Creatinine Clearance: 3.1 mL/min (A) (by C-G formula based on SCr of 15.45 mg/dL (H)).    Allergies  Allergen Reactions  . Amlodipine Swelling    Knees down to ankles Knees down to ankles  . Hctz [Hydrochlorothiazide]     pancreatitis  . Heparin Other (See Comments)    Pt reports cardiac arrest when given heparin  . Lmw Heparin     Reports cardiac arrest when given heparin  . Other Other (See Comments)    Reports cardiac arrest when given during surgery Reports cardiac arrest when given during surgery Reports cardiac arrest when given during surgery   . Sulfa Antibiotics Rash  . Dairy Aid [Lactase]     Runny nose  . Hydralazine Nausea Only    Antimicrobials this admission: Cefepime 10/14 >>       >>    Dose adjustments this admission:    Microbiology results:   BCx:     UCx:      Sputum:      MRSA PCR:   Body fluid cx: GI panel 10/11 Cdiff neg   Thank you for allowing pharmacy to be a part of this patient's care.  Leda Bellefeuille A 06/25/2020 1:46 PM

## 2020-06-25 NOTE — Consult Note (Signed)
Consultation Note Date: 06/25/2020   Patient Name: Tracy Jennings  DOB: 12-07-1955  MRN: [id number]  Age / Sex: 64 y.o., female  PCP: Sharyne Peach, MD Referring Physician: Loletha Grayer, MD  Reason for Consultation: Establishing goals of care  HPI/Patient Profile: 64 y.o. female  with past medical history of ESRD on peritoneal dialysis, cervical cancer (chemo and radiation), and HTN admitted on 06/21/2020 with nausea, vomiting, diarrhea, and abdominal pain. Possible symptoms are r/t chemotherapy vs infectious process. She has been started on antibiotics and protonix. Had been refusing some sessions of dialysis and refusing to take medications - however as of 10/14 has agreed to proceed with dialysis and did take "important" medications. PMT asked to discuss [place].    Clinical Assessment and Goals of Care: I have reviewed medical records including EPIC notes, labs and imaging, received report from RN and Dr. Juleen China, assessed the patient and then met with patient to discuss diagnosis prognosis, GOC, EOL wishes, disposition and options.  Patient was somewhat reluctant to speak with me; therefore, I attempted to keep conversation brief. She told me she was tired.  I introduced Palliative Medicine as specialized medical care for people living with serious illness. It focuses on providing relief from the symptoms and stress of a serious illness. The goal is to improve quality of life for both the patient and the family.  She tells me at home she was doing "excellent". She tells me of great functional status. She does tell me of poor appetite. She tells me she has tolerated cancer treatment well.   I attempted to elicit values and goals of care important to the patient.  She shares she is interested in continued aggressive medical interventions, including dialysis. I tried to explore why she had not agreed to some interventions recommended in  previous days but she did not elaborate - only tells me she wants dialysis now.   We did discuss code status and she would like to continue full code status.   She does share she would want her husband to be her decision maker if she were unable.   Did not discuss further as patient indicated she did not want to continue conversation. Outpatient palliative may be appropriate but we did not discuss this.   Primary Decision Maker PATIENT    SUMMARY OF RECOMMENDATIONS   -patient expresses interest in full code/full scope interventions - husband as decision maker if patient unable - she is not interested in further discussion, did not offer outpatient palliative d/t this  Code Status/Advance Care Planning:  Full code  Prognosis:   Unable to determine  Discharge Planning: To Be Determined      Primary Diagnoses: Present on Admission: . Intractable vomiting . Cervical cancer ([place]) . ESRD (end stage renal disease) ([place]) . Hypokalemia . Hyponatremia . Hypomagnesemia . Intractable nausea and vomiting   I have reviewed the medical record, interviewed the patient and family, and examined the patient. The following aspects are pertinent.  Past Medical History:  Diagnosis Date  . Anemia 04/2018   low iron. to be started on supplements  . Cervical cancer ([place])   . CKD (chronic kidney disease)    Stage IV  . Complication of anesthesia    receceived too much anesthesia, that she was in coma for a couple days   . COVID-19 virus detected 10/28/2019  . Diabetes mellitus without complication ([place])    type II  . ESRD (end stage renal disease) ([place])   .  Heart murmur    followed as a child only  . HSIL (high grade squamous intraepithelial lesion) on Pap smear of cervix   . Hyperlipidemia associated with type 2 diabetes mellitus ([place])   . Hypertension   . Pancreatitis   . Peripheral vascular disease ([place])    Social History   Socioeconomic History  . Marital status: Married     Spouse name: clarence  . Number of children: 2  . Years of education: Not on file  . Highest education level: Not on file  Occupational History    Comment: works nights  Tobacco Use  . Smoking status: Never Smoker  . Smokeless tobacco: Never Used  Vaping Use  . Vaping Use: Never used  Substance and Sexual Activity  . Alcohol use: No  . Drug use: Never  . Sexual activity: Not Currently  Other Topics Concern  . Not on file  Social History Narrative  . Not on file   Social Determinants of Health   Financial Resource Strain:   . Difficulty of Paying Living Expenses: Not on file  Food Insecurity:   . Worried About Charity fundraiser in the Last Year: Not on file  . Ran Out of Food in the Last Year: Not on file  Transportation Needs:   . Lack of Transportation (Medical): Not on file  . Lack of Transportation (Non-Medical): Not on file  Physical Activity:   . Days of Exercise per Week: Not on file  . Minutes of Exercise per Session: Not on file  Stress:   . Feeling of Stress : Not on file  Social Connections:   . Frequency of Communication with Friends and Family: Not on file  . Frequency of Social Gatherings with Friends and Family: Not on file  . Attends Religious Services: Not on file  . Active Member of Clubs or Organizations: Not on file  . Attends Archivist Meetings: Not on file  . Marital Status: Not on file   Family History  Problem Relation Age of Onset  . Stroke Mother   . Hypertension Mother   . Gout Mother   . Cancer Father   . Diabetes Sister   . Diabetes Maternal Grandmother   . Diabetes Maternal Grandfather   . Diabetes Paternal Grandmother   . Diabetes Paternal Grandfather    Scheduled Meds: . calcitRIOL  0.25 mcg Oral Daily  . calcium acetate  1,334 mg Oral BID WC  . carvedilol  25 mg Oral BID WC  . Chlorhexidine Gluconate Cloth  6 each Topical Daily  . cloNIDine  0.1 mg Transdermal Weekly  . diltiazem  180 mg Oral Daily  .  epoetin (EPOGEN/PROCRIT) injection  10,000 Units Subcutaneous Weekly  . feeding supplement  1 Container Oral TID BM  . gentamicin cream  1 application Topical Daily  . multivitamin  1 tablet Oral QAC breakfast  . pantoprazole (PROTONIX) IV  40 mg Intravenous Q12H   Continuous Infusions: . ceFEPime (MAXIPIME) IV    . dialysis solution 2.5% low-MG/low-CA    . metronidazole     PRN Meds:.acetaminophen **OR** acetaminophen, diphenoxylate-atropine, hydrALAZINE, labetalol, ondansetron **OR** ondansetron (ZOFRAN) IV, promethazine Allergies  Allergen Reactions  . Amlodipine Swelling    Knees down to ankles Knees down to ankles  . Hctz [Hydrochlorothiazide]     pancreatitis  . Heparin Other (See Comments)    Pt reports cardiac arrest when given heparin  . Lmw Heparin     Reports cardiac arrest when given  heparin  . Other Other (See Comments)    Reports cardiac arrest when given during surgery Reports cardiac arrest when given during surgery Reports cardiac arrest when given during surgery   . Sulfa Antibiotics Rash  . Dairy Aid [Lactase]     Runny nose  . Hydralazine Nausea Only   Review of Systems  Constitutional: Positive for activity change, appetite change and fatigue.  Gastrointestinal: Positive for nausea and vomiting.  Neurological: Positive for weakness.    Physical Exam Constitutional:      General: She is not in acute distress. Pulmonary:     Effort: Pulmonary effort is normal. No respiratory distress.  Musculoskeletal:     Right lower leg: No edema.     Left lower leg: No edema.  Skin:    General: Skin is warm and dry.  Neurological:     Mental Status: She is alert and oriented to person, place, and time.  Psychiatric:        Mood and Affect: Affect is flat.        Behavior: Behavior is withdrawn.     Vital Signs: BP (!) 148/63 (BP Location: Left Arm)   Pulse 91   Temp 98.5 F (36.9 C) (Oral)   Resp 16   Ht '5\' 3"'  (1.6 m)   Wt 56.7 kg   LMP  (LMP  Unknown)   SpO2 100%   BMI 22.14 kg/m  Pain Scale: 0-10   Pain Score: 0-No pain   SpO2: SpO2: 100 % O2 Device:SpO2: 100 % O2 Flow Rate: .   IO: Intake/output summary:   Intake/Output Summary (Last 24 hours) at 06/25/2020 1347 Last data filed at 06/25/2020 0231 Gross per 24 hour  Intake 360 ml  Output --  Net 360 ml    LBM: Last BM Date: 06/25/20 Baseline Weight: Weight: 56.7 kg Most recent weight: Weight: 56.7 kg     Palliative Assessment/Data: PPS 40%    Time Total: 45 minutes Greater than 50%  of this time was spent counseling and coordinating care related to the above assessment and plan.  Juel Burrow, DNP, AGNP-C Palliative Medicine Team [phone] Pager: [phone]

## 2020-06-25 NOTE — Progress Notes (Signed)
Patient ID: Tracy Jennings, female   DOB: 1956-04-12, 64 y.o.   MRN: [id number] [facility] PROGRESS NOTE  Tracy Jennings [id number] DOB: [birth year] DOA: 06/21/2020 PCP: Sharyne Peach, MD  HPI/Subjective: Patient still having nausea vomiting diarrhea and does not feel well.  Still having some abdominal pain.  Could not eat very much.  Husband was in the room and I did have permission to speak in front of him.  Patient states that she is now agreeable to do dialysis.  Objective: Vitals:   06/25/20 0914 06/25/20 1148  BP: (!) 142/78 (!) 148/63  Pulse: 89 91  Resp: 20 16  Temp: (!) 97.4 F (36.3 C) 98.5 F (36.9 C)  SpO2: 98% 100%    Intake/Output Summary (Last 24 hours) at 06/25/2020 1247 Last data filed at 06/25/2020 0231 Gross per 24 hour  Intake 360 ml  Output --  Net 360 ml   Filed Weights   06/20/20 2343  Weight: 56.7 kg    ROS: Review of Systems  Respiratory: Negative for shortness of breath.   Cardiovascular: Negative for chest pain.  Gastrointestinal: Positive for abdominal pain, diarrhea, nausea and vomiting.   Exam: Physical Exam HENT:     Head: Normocephalic.     Nose: No mucosal edema.     Mouth/Throat:     Pharynx: No oropharyngeal exudate.  Eyes:     General: Lids are normal.     Conjunctiva/sclera: Conjunctivae normal.     Pupils: Pupils are equal, round, and reactive to light.  Cardiovascular:     Rate and Rhythm: Normal rate and regular rhythm.     Heart sounds: Normal heart sounds, S1 normal and S2 normal.  Pulmonary:     Breath sounds: No decreased breath sounds, wheezing, rhonchi or rales.  Abdominal:     Palpations: Abdomen is soft.     Tenderness: There is abdominal tenderness.  Musculoskeletal:     Right lower leg: No swelling.     Left lower leg: No swelling.  Skin:    General: Skin is warm.     Findings: No rash.  Neurological:     Mental Status: She is alert and oriented to person, place, and time.       Data  Reviewed: Basic Metabolic Panel: Recent Labs  Lab 06/21/20 0000 06/21/20 0502 06/22/20 0445 06/23/20 0534 06/24/20 0634  NA 129*  --  130* 130* 133*  K 2.9*  --  2.8* 3.3* 4.1  CL 90*  --  93* 96* 102  CO2 20*  --  25 19* 18*  GLUCOSE 74  --  117* 101* 77  BUN 88*  --  90* 82* 83*  CREATININE 18.12*  --  17.15* 15.18* 15.45*  CALCIUM 6.6*  --  7.0* 6.9* 6.6*  MG  --  1.6* 2.0 1.9  --   PHOS  --   --   --   --  6.1*   Liver Function Tests: Recent Labs  Lab 06/23/20 0534  AST 13*  ALT <5  ALKPHOS 42  BILITOT 0.7  PROT 5.4*  ALBUMIN 2.1*   Recent Labs  Lab 06/21/20 0000  LIPASE 46   CBC: Recent Labs  Lab 06/21/20 0000 06/22/20 0445 06/23/20 0534 06/23/20 1401 06/23/20 1850 06/24/20 0034 06/24/20 0634  WBC 7.2   < > 7.7 7.9 8.8 8.1 7.2  NEUTROABS 6.8  --   --   --   --   --   --   HGB  9.7*   < > 9.1* 9.2* 10.2* 9.2* 9.2*  HCT 26.6*   < > 25.5* 25.6* 28.7* 26.7* 26.8*  MCV 90.5   < > 88.5 88.6 88.9 90.5 90.5  PLT 164   < > 124* 133* 156 144* 139*   < > = values in this interval not displayed.    CBG: Recent Labs  Lab 06/23/20 2034 06/24/20 0752 06/24/20 1210 06/24/20 1635 06/25/20 1139  GLUCAP 79 73 73 74 71    Recent Results (from the past 240 hour(s))  Respiratory Panel by RT PCR (Flu A&B, Covid) - Nasopharyngeal Swab     Status: None   Collection Time: 06/21/20 12:05 PM   Specimen: Nasopharyngeal Swab  Result Value Ref Range Status   SARS Coronavirus 2 by RT PCR NEGATIVE NEGATIVE Final    Comment: (NOTE) SARS-CoV-2 target nucleic acids are NOT DETECTED.  The SARS-CoV-2 RNA is generally detectable in upper respiratoy specimens during the acute phase of infection. The lowest concentration of SARS-CoV-2 viral copies this assay can detect is 131 copies/mL. A negative result does not preclude SARS-Cov-2 infection and should not be used as the sole basis for treatment or other patient management decisions. A negative result may occur with   improper specimen collection/handling, submission of specimen other than nasopharyngeal swab, presence of viral mutation(s) within the areas targeted by this assay, and inadequate number of viral copies (<131 copies/mL). A negative result must be combined with clinical observations, patient history, and epidemiological information. The expected result is Negative.  Fact Sheet for Patients:  PinkCheek.be  Fact Sheet for Healthcare Providers:  GravelBags.it  This test is no t yet approved or cleared by the [place] FDA and  has been authorized for detection and/or diagnosis of SARS-CoV-2 by FDA under an Emergency Use Authorization (EUA). This EUA will remain  in effect (meaning this test can be used) for the duration of the COVID-19 declaration under Section 564(b)(1) of the Act, 21 U.S.C. section 360bbb-3(b)(1), unless the authorization is terminated or revoked sooner.     Influenza A by PCR NEGATIVE NEGATIVE Final   Influenza B by PCR NEGATIVE NEGATIVE Final    Comment: (NOTE) The Xpert Xpress SARS-CoV-2/FLU/RSV assay is intended as an aid in  the diagnosis of influenza from Nasopharyngeal swab specimens and  should not be used as a sole basis for treatment. Nasal washings and  aspirates are unacceptable for Xpert Xpress SARS-CoV-2/FLU/RSV  testing.  Fact Sheet for Patients: PinkCheek.be  Fact Sheet for Healthcare Providers: GravelBags.it  This test is not yet approved or cleared by the [place] FDA and  has been authorized for detection and/or diagnosis of SARS-CoV-2 by  FDA under an Emergency Use Authorization (EUA). This EUA will remain  in effect (meaning this test can be used) for the duration of the  Covid-19 declaration under Section 564(b)(1) of the Act, 21  U.S.C. section 360bbb-3(b)(1), unless the authorization is  terminated or  revoked. Performed at [facility], [address] [place], [place] [address]   C Difficile Quick Screen w PCR reflex     Status: None   Collection Time: 06/22/20 12:35 PM   Specimen: STOOL  Result Value Ref Range Status   C Diff antigen NEGATIVE NEGATIVE Final   C Diff toxin NEGATIVE NEGATIVE Final   C Diff interpretation No C. difficile detected.  Final    Comment: Performed at [facility], [address], [place], [place] [address]     Scheduled Meds: .  calcitRIOL  0.25 mcg Oral Daily  . calcium acetate  1,334 mg Oral BID WC  . carvedilol  25 mg Oral BID WC  . Chlorhexidine Gluconate Cloth  6 each Topical Daily  . cloNIDine  0.1 mg Transdermal Weekly  . diltiazem  180 mg Oral Daily  . epoetin (EPOGEN/PROCRIT) injection  10,000 Units Subcutaneous Weekly  . feeding supplement  1 Container Oral TID BM  . gentamicin cream  1 application Topical Daily  . multivitamin  1 tablet Oral QAC breakfast  . pantoprazole (PROTONIX) IV  40 mg Intravenous Q12H   Continuous Infusions: . dialysis solution 2.5% low-MG/low-CA      Assessment/Plan:  1. Nausea, vomiting, diarrhea and abdominal pain.  Could be secondary to chemotherapy versus colitis or SBP.  Will start empiric antibiotics since not any better with Maxipime and Flagyl.  Stool for C. difficile is negative but will send off stool comprehensive panel and stool for ova and parasites.  On empiric Protonix.  As needed nausea medications and pain medications. 2. Accelerated hypertension.  Blood pressure better today with addition of clonidine patch. 3. Acute blood loss anemia.  Will check hemoglobin again tomorrow. 4. End-stage renal disease.  Patient agreeable to peritoneal dialysis.  As per nephrology note that she agreed to a short session today. 5. Hypokalemia, hypomagnesemia and hyponatremia secondary to GI losses. 6. Cervical cancer.  Follow-up with oncology as outpatient      Code Status:     Code  Status Orders  (From admission, onward)         Start     Ordered   06/21/20 0818  Full code  Continuous        06/21/20 0819        Code Status History    Date Active Date Inactive Code Status Order ID Comments User Context   04/08/2020 0959 04/08/2020 1627 Full Code [id number]  Mellody Drown, MD Inpatient   11/03/2019 2104 11/09/2019 2032 Full Code [id number]  Athena Masse, MD ED   10/29/2019 0243 11/01/2019 2028 Full Code [id number]  Mansy, Arvella Merles, MD ED   05/11/2018 2017 05/25/2018 2211 Full Code [id number]  Erlene Quan, NP Inpatient   05/11/2018 1711 05/11/2018 2017 Full Code [id number]  Delana Meyer, Dolores Lory, MD Inpatient   11/01/2017 0345 11/03/2017 1655 Full Code [id number]  Lance Coon, MD Inpatient   Advance Care Planning Activity     Family Communication: Husband at the bedside Disposition Plan: Status is: Inpatient  Dispo:  Patient From: Home  Planned Disposition: Home  Expected discharge date: Likely will need a few more days here in the hospital  Medically stable for discharge: No.  Patient will have to tolerate solid food diet prior to disposition.  Consultants:  Nephrology  Palliative care  Antibiotics:  Start empiric antibiotics of Maxipime and Flagyl  Time spent: 28 minutes  [place]

## 2020-06-26 DIAGNOSIS — I10 Essential (primary) hypertension: Secondary | ICD-10-CM | POA: Diagnosis not present

## 2020-06-26 DIAGNOSIS — N186 End stage renal disease: Secondary | ICD-10-CM | POA: Diagnosis not present

## 2020-06-26 DIAGNOSIS — R112 Nausea with vomiting, unspecified: Secondary | ICD-10-CM | POA: Diagnosis not present

## 2020-06-26 DIAGNOSIS — E876 Hypokalemia: Secondary | ICD-10-CM | POA: Diagnosis not present

## 2020-06-26 LAB — GASTROINTESTINAL PANEL BY PCR, STOOL (REPLACES STOOL CULTURE)

## 2020-06-26 MED ORDER — LORAZEPAM 2 MG/ML IJ SOLN
0.5000 mg | Freq: Once | INTRAMUSCULAR | Status: AC
  Administered 2020-06-26: 0.5 mg via INTRAVENOUS
  Filled 2020-06-26: qty 1

## 2020-06-26 MED ORDER — LORAZEPAM 2 MG/ML IJ SOLN
0.5000 mg | Freq: Four times a day (QID) | INTRAMUSCULAR | Status: DC | PRN
  Filled 2020-06-26: qty 1

## 2020-06-26 MED ORDER — MICONAZOLE NITRATE 2 % EX CREA
TOPICAL_CREAM | Freq: Two times a day (BID) | CUTANEOUS | Status: DC
  Administered 2020-06-26: 1 via TOPICAL
  Filled 2020-06-26 (×2): qty 14

## 2020-06-26 MED ORDER — SODIUM CHLORIDE 0.9 % IV SOLN
INTRAVENOUS | Status: DC | PRN
  Administered 2020-06-28 – 2020-07-01 (×4): 250 mL via INTRAVENOUS

## 2020-06-26 MED ORDER — DIPHENHYDRAMINE HCL 25 MG PO CAPS
25.0000 mg | ORAL_CAPSULE | ORAL | Status: DC | PRN
  Administered 2020-06-26: 25 mg via ORAL
  Filled 2020-06-26: qty 1

## 2020-06-26 NOTE — Progress Notes (Signed)
Patient ID: MAKALYN LENNOX, female   DOB: Jan 03, 1956, 64 y.o.   MRN: [id number] [facility] PROGRESS NOTE  HAROLD MATTES [id number]:[id number] DOB: 16-Mar-1956 DOA: 06/21/2020 PCP: Sharyne Peach, MD  HPI/Subjective: Patient still having nausea vomiting and diarrhea.  Still does not feel well.  Has abdominal pain when I press but not when I am not pressing.  Came in with the above symptoms also.  Objective: Vitals:   06/26/20 0753 06/26/20 1124  BP: (!) 154/68 (!) 189/82  Pulse: 91 97  Resp: 18   Temp: 98.6 F (37 C) 97.9 F (36.6 C)  SpO2:  100%    Intake/Output Summary (Last 24 hours) at 06/26/2020 1229 Last data filed at 06/26/2020 0500 Gross per 24 hour  Intake 460 ml  Output --  Net 460 ml   Filed Weights   06/20/20 2343 06/26/20 0500  Weight: 56.7 kg 64.2 kg    ROS: Review of Systems  Respiratory: Negative for cough and shortness of breath.   Cardiovascular: Negative for chest pain.  Gastrointestinal: Positive for abdominal pain, diarrhea, nausea and vomiting.   Exam: Physical Exam HENT:     Head: Normocephalic.     Mouth/Throat:     Pharynx: No oropharyngeal exudate.  Eyes:     General: Lids are normal.     Conjunctiva/sclera: Conjunctivae normal.     Pupils: Pupils are equal, round, and reactive to light.  Cardiovascular:     Rate and Rhythm: Normal rate and regular rhythm.     Heart sounds: Normal heart sounds, S1 normal and S2 normal.  Pulmonary:     Breath sounds: No decreased breath sounds, wheezing, rhonchi or rales.  Abdominal:     Palpations: Abdomen is soft.     Tenderness: There is generalized abdominal tenderness.  Musculoskeletal:     Right lower leg: No swelling.     Left lower leg: No swelling.  Skin:    General: Skin is warm.     Findings: No rash.  Neurological:     Mental Status: She is alert and oriented to person, place, and time.       Data Reviewed: Basic Metabolic Panel: Recent Labs  Lab 06/21/20 0000  06/21/20 0502 06/22/20 0445 06/23/20 0534 06/24/20 0634  NA 129*  --  130* 130* 133*  K 2.9*  --  2.8* 3.3* 4.1  CL 90*  --  93* 96* 102  CO2 20*  --  25 19* 18*  GLUCOSE 74  --  117* 101* 77  BUN 88*  --  90* 82* 83*  CREATININE 18.12*  --  17.15* 15.18* 15.45*  CALCIUM 6.6*  --  7.0* 6.9* 6.6*  MG  --  1.6* 2.0 1.9  --   PHOS  --   --   --   --  6.1*   Liver Function Tests: Recent Labs  Lab 06/23/20 0534  AST 13*  ALT <5  ALKPHOS 42  BILITOT 0.7  PROT 5.4*  ALBUMIN 2.1*   Recent Labs  Lab 06/21/20 0000  LIPASE 46   CBC: Recent Labs  Lab 06/21/20 0000 06/22/20 0445 06/23/20 0534 06/23/20 1401 06/23/20 1850 06/24/20 0034 06/24/20 0634  WBC 7.2   < > 7.7 7.9 8.8 8.1 7.2  NEUTROABS 6.8  --   --   --   --   --   --   HGB 9.7*   < > 9.1* 9.2* 10.2* 9.2* 9.2*  HCT 26.6*   < > 25.5*  25.6* 28.7* 26.7* 26.8*  MCV 90.5   < > 88.5 88.6 88.9 90.5 90.5  PLT 164   < > 124* 133* 156 144* 139*   < > = values in this interval not displayed.    CBG: Recent Labs  Lab 06/24/20 0752 06/24/20 1210 06/24/20 1635 06/25/20 1139 06/25/20 1609  GLUCAP 73 73 74 71 64*    Recent Results (from the past 240 hour(s))  Respiratory Panel by RT PCR (Flu A&B, Covid) - Nasopharyngeal Swab     Status: None   Collection Time: 06/21/20 12:05 PM   Specimen: Nasopharyngeal Swab  Result Value Ref Range Status   SARS Coronavirus 2 by RT PCR NEGATIVE NEGATIVE Final    Comment: (NOTE) SARS-CoV-2 target nucleic acids are NOT DETECTED.  The SARS-CoV-2 RNA is generally detectable in upper respiratoy specimens during the acute phase of infection. The lowest concentration of SARS-CoV-2 viral copies this assay can detect is 131 copies/mL. A negative result does not preclude SARS-Cov-2 infection and should not be used as the sole basis for treatment or other patient management decisions. A negative result may occur with  improper specimen collection/handling, submission of specimen  other than nasopharyngeal swab, presence of viral mutation(s) within the areas targeted by this assay, and inadequate number of viral copies (<131 copies/mL). A negative result must be combined with clinical observations, patient history, and epidemiological information. The expected result is Negative.  Fact Sheet for Patients:  PinkCheek.be  Fact Sheet for Healthcare Providers:  GravelBags.it  This test is no t yet approved or cleared by the [place] FDA and  has been authorized for detection and/or diagnosis of SARS-CoV-2 by FDA under an Emergency Use Authorization (EUA). This EUA will remain  in effect (meaning this test can be used) for the duration of the COVID-19 declaration under Section 564(b)(1) of the Act, 21 U.S.C. section 360bbb-3(b)(1), unless the authorization is terminated or revoked sooner.     Influenza A by PCR NEGATIVE NEGATIVE Final   Influenza B by PCR NEGATIVE NEGATIVE Final    Comment: (NOTE) The Xpert Xpress SARS-CoV-2/FLU/RSV assay is intended as an aid in  the diagnosis of influenza from Nasopharyngeal swab specimens and  should not be used as a sole basis for treatment. Nasal washings and  aspirates are unacceptable for Xpert Xpress SARS-CoV-2/FLU/RSV  testing.  Fact Sheet for Patients: PinkCheek.be  Fact Sheet for Healthcare Providers: GravelBags.it  This test is not yet approved or cleared by the [place] FDA and  has been authorized for detection and/or diagnosis of SARS-CoV-2 by  FDA under an Emergency Use Authorization (EUA). This EUA will remain  in effect (meaning this test can be used) for the duration of the  Covid-19 declaration under Section 564(b)(1) of the Act, 21  U.S.C. section 360bbb-3(b)(1), unless the authorization is  terminated or revoked. Performed at [facility], [address]  [place], [place] [address]   C Difficile Quick Screen w PCR reflex     Status: None   Collection Time: 06/22/20 12:35 PM   Specimen: STOOL  Result Value Ref Range Status   C Diff antigen NEGATIVE NEGATIVE Final   C Diff toxin NEGATIVE NEGATIVE Final   C Diff interpretation No C. difficile detected.  Final    Comment: Performed at [facility], [address] [place], [place] [address]  Gastrointestinal Panel by PCR , Stool     Status: None   Collection Time: 06/26/20  9:17 AM   Specimen:  Stool  Result Value Ref Range Status   Campylobacter species NOT DETECTED NOT DETECTED Final   Plesimonas shigelloides NOT DETECTED NOT DETECTED Final   Salmonella species NOT DETECTED NOT DETECTED Final   Yersinia enterocolitica NOT DETECTED NOT DETECTED Final   Vibrio species NOT DETECTED NOT DETECTED Final   Vibrio cholerae NOT DETECTED NOT DETECTED Final   Enteroaggregative E coli (EAEC) NOT DETECTED NOT DETECTED Final   Enteropathogenic E coli (EPEC) NOT DETECTED NOT DETECTED Final   Enterotoxigenic E coli (ETEC) NOT DETECTED NOT DETECTED Final   Shiga like toxin producing E coli (STEC) NOT DETECTED NOT DETECTED Final   Shigella/Enteroinvasive E coli (EIEC) NOT DETECTED NOT DETECTED Final   Cryptosporidium NOT DETECTED NOT DETECTED Final   Cyclospora cayetanensis NOT DETECTED NOT DETECTED Final   Entamoeba histolytica NOT DETECTED NOT DETECTED Final   Giardia lamblia NOT DETECTED NOT DETECTED Final   Adenovirus F40/41 NOT DETECTED NOT DETECTED Final   Astrovirus NOT DETECTED NOT DETECTED Final   Norovirus GI/GII NOT DETECTED NOT DETECTED Final   Rotavirus A NOT DETECTED NOT DETECTED Final   Sapovirus (I, II, IV, and V) NOT DETECTED NOT DETECTED Final    Comment: Performed at [facility], [address] [place], [place] [address]      Scheduled Meds:  calcitRIOL  0.25 mcg Oral Daily   calcium acetate  1,334 mg Oral BID WC   carvedilol  25 mg Oral BID WC    Chlorhexidine Gluconate Cloth  6 each Topical Daily   cloNIDine  0.1 mg Transdermal Weekly   diltiazem  180 mg Oral Daily   epoetin (EPOGEN/PROCRIT) injection  10,000 Units Subcutaneous Weekly   feeding supplement  1 Container Oral TID BM   gentamicin cream  1 application Topical Daily   LORazepam  0.5 mg Intravenous Once   multivitamin  1 tablet Oral QAC breakfast   pantoprazole (PROTONIX) IV  40 mg Intravenous Q12H   Continuous Infusions:  ceFEPime (MAXIPIME) IV 1 g (06/25/20 1531)   dialysis solution 2.5% low-MG/low-CA     metronidazole 500 mg (06/26/20 0456)    Assessment/Plan:  1. Nausea, vomiting, diarrhea and abdominal pain.  This is still prolonged.  Recent chemotherapy, possible colitis or SBP.  Empiric antibiotic started yesterday with Maxipime and Flagyl.  Stool for C. difficile negative.  Stool comprehensive panel negative.  Stool ova and parasites pending.  Empiric Protonix.  As needed nausea medications and pain medications.  Will give 1 dose of Ativan and see if that helps.  Patient will have to eat and tolerate food prior to disposition.  Patient refused endoscopy.  If patient unable to keep any food down will have to consider NG tube for TPN feeding. 2. Essential hypertension.  Continue clonidine patch and Coreg and diltiazem. 3. End-stage renal disease.  Patient agreeable to peritoneal dialysis as per nephrology. 4. Hypokalemia, hypomagnesemia and hyponatremia secondary to GI losses.  Recheck electrolytes tomorrow. 5. Cervical cancer.  Follow-up with oncology as outpatient.     Code Status:     Code Status Orders  (From admission, onward)         Start     Ordered   06/21/20 0818  Full code  Continuous        06/21/20 0819        Code Status History    Date Active Date Inactive Code Status Order ID Comments User Context   04/08/2020 0959 04/08/2020 1627 Full Code [id number]  Mellody Drown, MD  Inpatient   11/03/2019 2104 11/09/2019 2032 Full Code  [id number]  Athena Masse, MD ED   10/29/2019 0243 11/01/2019 2028 Full Code [id number]  Mansy, Arvella Merles, MD ED   05/11/2018 2017 05/25/2018 2211 Full Code [id number]  Erlene Quan, NP Inpatient   05/11/2018 1711 05/11/2018 2017 Full Code [id number]  Katha Cabal, MD Inpatient   11/01/2017 0345 11/03/2017 1655 Full Code [id number]  Lance Coon, MD Inpatient   Advance Care Planning Activity     Family Communication: Spoke with husband on the phone Disposition Plan: Status is: Inpatient  Dispo:  Patient From: Home  Planned Disposition: Home  Expected discharge date: Patient will have to eat solid food and keep it down prior to disposition.  Medically stable for discharge: No  Consultants:  Palliative care  Nephrology  Gastroenterology initially.  Antibiotics:  Maxipime  Flagyl  Time spent: 28 minutes  [place]

## 2020-06-26 NOTE — Evaluation (Signed)
Physical Therapy Evaluation Patient Details Name: Tracy Jennings MRN: [id number] DOB: Jan 14, 1956 Today's Date: 06/26/2020   History of Present Illness  64 y.o. female with medical history significant for end-stage renal disease on peritoneal dialysis, history of cervical cancer (and has completed chemo and radiation therapy), patient is scheduled to follow-up at [facility] for brachytherapy due to persistent disease and hypertension who presents with c/o nausea, vomiting and diarrhea and generalized weakness  Clinical Impression  Pt again with covers pulled over her head and little interest in working with PT.  I was able to convince her that we needed to do some activity and assess her safety in returning home.  She was able to do most tasks w/o assist but had nausea and dry heaving t/o most of session and was clearly uncomfortable the entire time.  We were able to do some mobility in the room with a ~30 ft walk (using walker, does not typically need AD).  Suggested HHPT, however pt showed no interest.   Follow Up Recommendations No PT follow up;Home health PT (pt would benefit but does not want HHPT)    Equipment Recommendations   (pt reports she does have walker at home)    Recommendations for Other Services       Precautions / Restrictions Precautions Precautions: Fall Restrictions Weight Bearing Restrictions: No      Mobility  Bed Mobility Overal bed mobility: Independent             General bed mobility comments: Pt able to get herself up to sitting slowly, but w/o assist  Transfers Overall transfer level: Modified independent Equipment used: Rolling walker (2 wheeled)             General transfer comment: Pt was able to rise to standing w/o phyiscal assist, despite not normally needing RW she endorses liking and essentially needing it for stability todat  Ambulation/Gait Ambulation/Gait assistance: Modified independent (Device/Increase time) Gait Distance (Feet):  30 Feet Assistive device: Rolling walker (2 wheeled)       General Gait Details: Pt with slow but safe ambulation in room with walker.  She was nauseated the entire time and frankly did not want to do any more than she had to, but again showed safety for in-home distances using walker  Stairs            Wheelchair Mobility    Modified Rankin (Stroke Patients Only)       Balance Overall balance assessment: Modified Independent (lightly reliant on walker with standing tasks)                                           Pertinent Vitals/Pain Pain Assessment:  (nauseated t/o the session)    Home Living Family/patient expects to be discharged to:: Private residence Living Arrangements: Spouse/significant other Available Help at Discharge: Family Type of Home: House Home Access: Ramped entrance     Home Layout: One level Home Equipment: [place] held shower head      Prior Function Level of Independence: Independent (pt reports she does some driving, does not need AD)               Hand Dominance        Extremity/Trunk Assessment   Upper Extremity Assessment Upper Extremity Assessment: Overall WFL for tasks assessed;Generalized weakness    Lower Extremity Assessment Lower Extremity Assessment: Overall  WFL for tasks assessed;Generalized weakness       Communication   Communication: No difficulties  Cognition Arousal/Alertness: Awake/alert Behavior During Therapy: Flat affect Overall Cognitive Status: Within Functional Limits for tasks assessed                                        General Comments      Exercises     Assessment/Plan    PT Assessment Patient needs continued PT services  PT Problem List Decreased strength;Decreased range of motion;Decreased activity tolerance;Decreased balance;Decreased mobility;Decreased coordination;Decreased knowledge of use of DME;Decreased cognition;Decreased safety  awareness       PT Treatment Interventions DME instruction;Gait training;Functional mobility training;Therapeutic activities;Therapeutic exercise;Balance training;Neuromuscular re-education;Patient/family education    PT Goals (Current goals can be found in the Care Plan section)  Acute Rehab PT Goals Patient Stated Goal: go home PT Goal Formulation: With patient Time For Goal Achievement: 07/10/20 Potential to Achieve Goals: Good    Frequency Min 2X/week   Barriers to discharge        Co-evaluation               AM-PAC PT "6 Clicks" Mobility  Outcome Measure Help needed turning from your back to your side while in a flat bed without using bedrails?: None Help needed moving from lying on your back to sitting on the side of a flat bed without using bedrails?: None Help needed moving to and from a bed to a chair (including a wheelchair)?: None Help needed standing up from a chair using your arms (e.g., wheelchair or bedside chair)?: None Help needed to walk in hospital room?: A Little Help needed climbing 3-5 steps with a railing? : A Little 6 Click Score: 22    End of Session Equipment Utilized During Treatment: Gait belt Activity Tolerance: Patient limited by lethargy Patient left: with bed alarm set;with call bell/phone within reach Nurse Communication: Mobility status PT Visit Diagnosis: Muscle weakness (generalized) (M62.81);Difficulty in walking, not elsewhere classified (R26.2);Unsteadiness on feet (R26.81)    Time: 5621-3086 PT Time Calculation (min) (ACUTE ONLY): 23 min   Charges:   PT Evaluation $PT Eval Low Complexity: 1 Low          Kreg Shropshire, DPT 06/26/2020, 2:08 PM

## 2020-06-26 NOTE — Progress Notes (Signed)
[facility]  ROUNDING NOTE   Subjective:   [name] was admitted to [facility] on 06/21/2020 for Hypocalcemia [E83.51] Hypokalemia [E87.6] Hypomagnesemia [E83.42] Chronic abdominal pain [R10.9, G89.29] Intractable vomiting [R11.10] Generalized weakness [R53.1] ESRD on peritoneal dialysis ([place]) [N18.6, Z99.2] Intractable nausea and vomiting [R11.2] Refractory nausea and vomiting [R11.2] Intractable diarrhea [R19.7]  Patient resting in bed ,covered in blankets, appears withdrawn.  She  still has complaints of abdominal discomfort, nausea, reports appetite is poor. Patient tolerated peritoneal dialysis well last night.    Objective:  Vital signs in last 24 hours:  Temp:  [97.9 F (36.6 C)-98.9 F (37.2 C)] 97.9 F (36.6 C) (10/15 1124) Pulse Rate:  [88-97] 97 (10/15 1124) Resp:  [16-18] 18 (10/15 0753) BP: (137-189)/(63-82) 189/82 (10/15 1124) SpO2:  [98 %-100 %] 100 % (10/15 1124) Weight:  [64.2 kg] 64.2 kg (10/15 0500)  Weight change:  Filed Weights   06/20/20 2343 06/26/20 0500  Weight: 56.7 kg 64.2 kg    Intake/Output: I/O last 3 completed shifts: In: 100 [P.O.:720; IV Piggyback:100] Out: -    Intake/Output this shift:  No intake/output data recorded.  Physical Exam: General:  Resting in bed with eyes closed, arousable to call  Head:  Normocephalic, atraumatic, oral mucosal membranes appears dry  Eyes:  Sclerae and conjunctivae clear  Neck: Supple, trachea at midline  Lungs:  Respirations even,unlabored,Lungs clear to auscultation  Heart:  S1S2, no rubs or gallops  Abdomen:  Soft,non distended, tenderness+  Extremities:  No peripheral edema.  Neurologic:  Sleeping, arousable to call  Skin: No acute lesions or rashes  Access: PD catheter site with dressing clean dry and intact    Basic Metabolic Panel: Recent Labs  Lab 06/21/20 0000 06/21/20 0000 06/21/20 0502 06/22/20 0445 06/23/20 0534 06/24/20 0634  NA 129*  --   --  130* 130*  133*  K 2.9*  --   --  2.8* 3.3* 4.1  CL 90*  --   --  93* 96* 102  CO2 20*  --   --  25 19* 18*  GLUCOSE 74  --   --  117* 101* 77  BUN 88*  --   --  90* 82* 83*  CREATININE 18.12*  --   --  17.15* 15.18* 15.45*  CALCIUM 6.6*   < >  --  7.0* 6.9* 6.6*  MG  --   --  1.6* 2.0 1.9  --   PHOS  --   --   --   --   --  6.1*   < > = values in this interval not displayed.    Liver Function Tests: Recent Labs  Lab 06/23/20 0534  AST 13*  ALT <5  ALKPHOS 42  BILITOT 0.7  PROT 5.4*  ALBUMIN 2.1*   Recent Labs  Lab 06/21/20 0000  LIPASE 46   No results for input(s): AMMONIA in the last 168 hours.  CBC: Recent Labs  Lab 06/21/20 0000 06/22/20 0445 06/23/20 0534 06/23/20 1401 06/23/20 1850 06/24/20 0034 06/24/20 0634  WBC 7.2   < > 7.7 7.9 8.8 8.1 7.2  NEUTROABS 6.8  --   --   --   --   --   --   HGB 9.7*   < > 9.1* 9.2* 10.2* 9.2* 9.2*  HCT 26.6*   < > 25.5* 25.6* 28.7* 26.7* 26.8*  MCV 90.5   < > 88.5 88.6 88.9 90.5 90.5  PLT 164   < > 124*  133* 156 144* 139*   < > = values in this interval not displayed.    Cardiac Enzymes: No results for input(s): CKTOTAL, CKMB, CKMBINDEX, TROPONINI in the last 168 hours.  BNP: Invalid input(s): POCBNP  CBG: Recent Labs  Lab 06/24/20 0752 06/24/20 1210 06/24/20 1635 06/25/20 1139 06/25/20 1609  GLUCAP 73 73 74 71 81*    Microbiology: Results for orders placed or performed during the hospital encounter of 06/21/20  Respiratory Panel by RT PCR (Flu A&B, Covid) - Nasopharyngeal Swab     Status: None   Collection Time: 06/21/20 12:05 PM   Specimen: Nasopharyngeal Swab  Result Value Ref Range Status   SARS Coronavirus 2 by RT PCR NEGATIVE NEGATIVE Final    Comment: (NOTE) SARS-CoV-2 target nucleic acids are NOT DETECTED.  The SARS-CoV-2 RNA is generally detectable in upper respiratoy specimens during the acute phase of infection. The lowest concentration of SARS-CoV-2 viral copies this assay can detect is 131  copies/mL. A negative result does not preclude SARS-Cov-2 infection and should not be used as the sole basis for treatment or other patient management decisions. A negative result may occur with  improper specimen collection/handling, submission of specimen other than nasopharyngeal swab, presence of viral mutation(s) within the areas targeted by this assay, and inadequate number of viral copies (<131 copies/mL). A negative result must be combined with clinical observations, patient history, and epidemiological information. The expected result is Negative.  Fact Sheet for Patients:  PinkCheek.be  Fact Sheet for Healthcare Providers:  GravelBags.it  This test is no t yet approved or cleared by the [place] FDA and  has been authorized for detection and/or diagnosis of SARS-CoV-2 by FDA under an Emergency Use Authorization (EUA). This EUA will remain  in effect (meaning this test can be used) for the duration of the COVID-19 declaration under Section 564(b)(1) of the Act, 21 U.S.C. section 360bbb-3(b)(1), unless the authorization is terminated or revoked sooner.     Influenza A by PCR NEGATIVE NEGATIVE Final   Influenza B by PCR NEGATIVE NEGATIVE Final    Comment: (NOTE) The Xpert Xpress SARS-CoV-2/FLU/RSV assay is intended as an aid in  the diagnosis of influenza from Nasopharyngeal swab specimens and  should not be used as a sole basis for treatment. Nasal washings and  aspirates are unacceptable for Xpert Xpress SARS-CoV-2/FLU/RSV  testing.  Fact Sheet for Patients: PinkCheek.be  Fact Sheet for Healthcare Providers: GravelBags.it  This test is not yet approved or cleared by the [place] FDA and  has been authorized for detection and/or diagnosis of SARS-CoV-2 by  FDA under an Emergency Use Authorization (EUA). This EUA will remain  in effect (meaning  this test can be used) for the duration of the  Covid-19 declaration under Section 564(b)(1) of the Act, 21  U.S.C. section 360bbb-3(b)(1), unless the authorization is  terminated or revoked. Performed at [facility], [address] [place], [place] [address]   C Difficile Quick Screen w PCR reflex     Status: None   Collection Time: 06/22/20 12:35 PM   Specimen: STOOL  Result Value Ref Range Status   C Diff antigen NEGATIVE NEGATIVE Final   C Diff toxin NEGATIVE NEGATIVE Final   C Diff interpretation No C. difficile detected.  Final    Comment: Performed at [facility], [address] [place], [place] [address]  Gastrointestinal Panel by PCR , Stool     Status: None   Collection Time: 06/26/20  9:17 AM  Specimen: Stool  Result Value Ref Range Status   Campylobacter species NOT DETECTED NOT DETECTED Final   Plesimonas shigelloides NOT DETECTED NOT DETECTED Final   Salmonella species NOT DETECTED NOT DETECTED Final   Yersinia enterocolitica NOT DETECTED NOT DETECTED Final   Vibrio species NOT DETECTED NOT DETECTED Final   Vibrio cholerae NOT DETECTED NOT DETECTED Final   Enteroaggregative E coli (EAEC) NOT DETECTED NOT DETECTED Final   Enteropathogenic E coli (EPEC) NOT DETECTED NOT DETECTED Final   Enterotoxigenic E coli (ETEC) NOT DETECTED NOT DETECTED Final   Shiga like toxin producing E coli (STEC) NOT DETECTED NOT DETECTED Final   Shigella/Enteroinvasive E coli (EIEC) NOT DETECTED NOT DETECTED Final   Cryptosporidium NOT DETECTED NOT DETECTED Final   Cyclospora cayetanensis NOT DETECTED NOT DETECTED Final   Entamoeba histolytica NOT DETECTED NOT DETECTED Final   Giardia lamblia NOT DETECTED NOT DETECTED Final   Adenovirus F40/41 NOT DETECTED NOT DETECTED Final   Astrovirus NOT DETECTED NOT DETECTED Final   Norovirus GI/GII NOT DETECTED NOT DETECTED Final   Rotavirus A NOT DETECTED NOT DETECTED Final   Sapovirus (I, II, IV, and V) NOT DETECTED NOT  DETECTED Final    Comment: Performed at [facility], [address] [place], [place] [address]    Coagulation Studies: No results for input(s): LABPROT, INR in the last 72 hours.  Urinalysis: No results for input(s): COLORURINE, LABSPEC, PHURINE, GLUCOSEU, HGBUR, BILIRUBINUR, KETONESUR, PROTEINUR, UROBILINOGEN, NITRITE, LEUKOCYTESUR in the last 72 hours.  Invalid input(s): APPERANCEUR    Imaging: No results found.   Medications:   . ceFEPime (MAXIPIME) IV 1 g (06/25/20 1531)  . dialysis solution 2.5% low-MG/low-CA    . metronidazole 500 mg (06/26/20 0456)   . calcitRIOL  0.25 mcg Oral Daily  . calcium acetate  1,334 mg Oral BID WC  . carvedilol  25 mg Oral BID WC  . Chlorhexidine Gluconate Cloth  6 each Topical Daily  . cloNIDine  0.1 mg Transdermal Weekly  . diltiazem  180 mg Oral Daily  . epoetin (EPOGEN/PROCRIT) injection  10,000 Units Subcutaneous Weekly  . feeding supplement  1 Container Oral TID BM  . gentamicin cream  1 application Topical Daily  . LORazepam  0.5 mg Intravenous Once  . multivitamin  1 tablet Oral QAC breakfast  . pantoprazole (PROTONIX) IV  40 mg Intravenous Q12H   acetaminophen **OR** acetaminophen, diphenoxylate-atropine, hydrALAZINE, labetalol, LORazepam, ondansetron **OR** ondansetron (ZOFRAN) IV, promethazine  Assessment/ Plan:  [name] is a 64 y.o. black female with end stage renal disease on peritoneal dialysis, cervical cancer, hypertension, diabetes mellitus type II, peripheral vascular disease, hyperlipidemia, who is admitted to [facility] on 06/21/2020 for Hypocalcemia [E83.51] Hypokalemia [E87.6] Hypomagnesemia [E83.42] Chronic abdominal pain [R10.9, G89.29] Intractable vomiting [R11.10] Generalized weakness [R53.1] ESRD on peritoneal dialysis ([place]) [N18.6, Z99.2] Intractable nausea and vomiting [R11.2] Refractory nausea and vomiting [R11.2] Intractable diarrhea [R19.7]  [facility]  66kg CCPD 9 hours, 4 exchanges, 2558mL fills with last fill of 2030mL with icodextran.   1. End Stage Renal Disease on peritoneal dialysis.  Patient tolerated peritoneal dialysis well last night We will plan for PD again tonight  2. Hypertension:  Home regimen of carvedilol and diltiazem.  Blood pressure readings stays elevated Patient slow to take her PO medicines Clonidine patch started on 06/24/20 On PRN Hydralazine and PRN Labetalol  3. Hypokalemia: with GI losses. On oral potassium supplementation at home.  -Potassium normalized to 4.1 on 06/24/20 --  Patient refusing lab draws   4. Hyponatremia: secondary to GI losses and kidney failure. _ Sodium 133 on 06/24/20 -Patient refusing lab draws   5. Anemia with chronic kidney disease:  Hemoglobin 9.2 on 06/24/20 Will continue Epogen 10,000 units subcu weekly Stool positive for occult blood Patient received PRBC transfusion on 06/23/20 Will continue monitoring  7. Secondary Hyperparathyroidism with hypocalcemia: Patient is on calcitriol and calcium acetate.    LOS: 5 Kaelynne Christley 10/15/202112:57 PM

## 2020-06-27 DIAGNOSIS — B370 Candidal stomatitis: Secondary | ICD-10-CM | POA: Diagnosis not present

## 2020-06-27 DIAGNOSIS — N186 End stage renal disease: Secondary | ICD-10-CM | POA: Diagnosis not present

## 2020-06-27 DIAGNOSIS — L8990 Pressure ulcer of unspecified site, unspecified stage: Secondary | ICD-10-CM | POA: Insufficient documentation

## 2020-06-27 DIAGNOSIS — L89152 Pressure ulcer of sacral region, stage 2: Secondary | ICD-10-CM

## 2020-06-27 DIAGNOSIS — R112 Nausea with vomiting, unspecified: Secondary | ICD-10-CM | POA: Diagnosis not present

## 2020-06-27 DIAGNOSIS — E871 Hypo-osmolality and hyponatremia: Secondary | ICD-10-CM

## 2020-06-27 DIAGNOSIS — D638 Anemia in other chronic diseases classified elsewhere: Secondary | ICD-10-CM

## 2020-06-27 DIAGNOSIS — I10 Essential (primary) hypertension: Secondary | ICD-10-CM | POA: Diagnosis not present

## 2020-06-27 LAB — COMPREHENSIVE METABOLIC PANEL
ALT: <5 U/L (ref 0–44)
AST: 11 U/L — ABNORMAL LOW (ref 15–41)
Albumin: 1.9 g/dL — ABNORMAL LOW (ref 3.5–5.0)
Alkaline Phosphatase: 37 U/L — ABNORMAL LOW (ref 38–126)
Anion gap: 12 (ref 5–15)
BUN: 78 mg/dL — ABNORMAL HIGH (ref 8–23)
CO2: 19 mmol/L — ABNORMAL LOW (ref 22–32)
Calcium: 6.9 mg/dL — ABNORMAL LOW (ref 8.9–10.3)
Chloride: 103 mmol/L (ref 98–111)
Creatinine, Ser: 15.03 mg/dL — ABNORMAL HIGH (ref 0.44–1.00)
GFR, Estimated: 2 mL/min — ABNORMAL LOW (ref >60)
Glucose, Bld: 97 mg/dL (ref 70–99)
Potassium: 3.6 mmol/L (ref 3.5–5.1)
Sodium: 134 mmol/L — ABNORMAL LOW (ref 135–145)
Total Bilirubin: 0.6 mg/dL (ref 0.3–1.2)
Total Protein: 4.9 g/dL — ABNORMAL LOW (ref 6.5–8.1)

## 2020-06-27 LAB — CBC
HCT: 24.9 % — ABNORMAL LOW (ref 36.0–46.0)
Hemoglobin: 8.5 g/dL — ABNORMAL LOW (ref 12.0–15.0)
MCH: 31.5 pg (ref 26.0–34.0)
MCHC: 34.1 g/dL (ref 30.0–36.0)
MCV: 92.2 fL (ref 80.0–100.0)
Platelets: 137 10*3/uL — ABNORMAL LOW (ref 150–400)
RBC: 2.7 MIL/uL — ABNORMAL LOW (ref 3.87–5.11)
RDW: 15.9 % — ABNORMAL HIGH (ref 11.5–15.5)
WBC: 5.6 10*3/uL (ref 4.0–10.5)
nRBC: 0 % (ref 0.0–0.2)

## 2020-06-27 MED ORDER — FLUCONAZOLE 100MG IVPB
100.0000 mg | INTRAVENOUS | Status: DC
  Filled 2020-06-27: qty 50

## 2020-06-27 MED ORDER — FLUCONAZOLE 100MG IVPB
100.0000 mg | INTRAVENOUS | Status: DC
  Administered 2020-06-27 – 2020-06-30 (×4): 100 mg via INTRAVENOUS
  Filled 2020-06-27 (×5): qty 50

## 2020-06-27 NOTE — Progress Notes (Signed)
   06/27/20 0921  Assess: MEWS Score  Temp 98.9 F (37.2 C)  BP (!) 216/85  Pulse Rate (!) 103  Resp 16  Level of Consciousness Alert  SpO2 99 %  O2 Device Room Air  Assess: MEWS Score  MEWS Temp 0  MEWS Systolic 2  MEWS Pulse 1  MEWS RR 0  MEWS LOC 0  MEWS Score 3  MEWS Score Color Yellow  Assess: if the MEWS score is Yellow or Red  Were vital signs taken at a resting state? Yes  Focused Assessment No change from prior assessment  Early Detection of Sepsis Score *See Row Information* Low  MEWS guidelines implemented *See Row Information* Yes  Treat  MEWS Interventions Administered prn meds/treatments  Pain Scale 0-10  Pain Score 0  Take Vital Signs  Increase Vital Sign Frequency  Yellow: Q 2hr X 2 then Q 4hr X 2, if remains yellow, continue Q 4hrs  Escalate  MEWS: Escalate Yellow: discuss with charge nurse/RN and consider discussing with provider and RRT  Notify: Charge Nurse/RN  Name of Charge Nurse/RN Notified Jericha Bryden   Date Charge Nurse/RN Notified 06/27/20  Time Charge Nurse/RN Notified [phone]

## 2020-06-27 NOTE — Progress Notes (Signed)
Patient ID: Tracy Jennings, female   DOB: 12/21/1955, 64 y.o.   MRN: [id number] [facility] PROGRESS NOTE  ELARA COCKE JJH:[id number] DOB: 1955-10-26 DOA: 06/21/2020 PCP: Sharyne Peach, MD  HPI/Subjective: Patient still does not feel great only feels a little bit better.  Did not have diarrhea yet today.  No abdominal pain.  Still feels nauseous.  The smell of food just makes her nauseous.  Unable to eat much at all.  Objective: Vitals:   06/27/20 1110 06/27/20 1141  BP: (!) 164/64 (!) 170/66  Pulse: 74 100  Resp: 16 16  Temp: 98.9 F (37.2 C) 98.8 F (37.1 C)  SpO2: (!) 82% 100%    Intake/Output Summary (Last 24 hours) at 06/27/2020 1231 Last data filed at 06/27/2020 0600 Gross per 24 hour  Intake 700 ml  Output 0 ml  Net 700 ml   Filed Weights   06/20/20 2343 06/26/20 0500 06/27/20 0500  Weight: 56.7 kg 64.2 kg 66.6 kg    ROS: Review of Systems  Respiratory: Negative for shortness of breath.   Cardiovascular: Negative for chest pain.  Gastrointestinal: Positive for diarrhea and nausea. Negative for abdominal pain and vomiting.   Exam: Physical Exam HENT:     Head: Normocephalic.     Mouth/Throat:     Pharynx: No oropharyngeal exudate.  Eyes:     General: Lids are normal.     Conjunctiva/sclera: Conjunctivae normal.     Pupils: Pupils are equal, round, and reactive to light.  Cardiovascular:     Rate and Rhythm: Normal rate and regular rhythm.     Heart sounds: Normal heart sounds, S1 normal and S2 normal.  Pulmonary:     Breath sounds: No decreased breath sounds, wheezing, rhonchi or rales.  Abdominal:     Palpations: Abdomen is soft.     Tenderness: There is no abdominal tenderness.  Musculoskeletal:     Right ankle: No swelling.     Left ankle: No swelling.  Skin:    General: Skin is warm.     Comments: Stage II decubitus ulcer sacral area  Neurological:     Mental Status: She is alert and oriented to person, place, and time.        Data Reviewed: Basic Metabolic Panel: Recent Labs  Lab 06/21/20 0000 06/21/20 0502 06/22/20 0445 06/23/20 0534 06/24/20 0634 06/27/20 0503  NA 129*  --  130* 130* 133* 134*  K 2.9*  --  2.8* 3.3* 4.1 3.6  CL 90*  --  93* 96* 102 103  CO2 20*  --  25 19* 18* 19*  GLUCOSE 74  --  117* 101* 77 97  BUN 88*  --  90* 82* 83* 78*  CREATININE 18.12*  --  17.15* 15.18* 15.45* 15.03*  CALCIUM 6.6*  --  7.0* 6.9* 6.6* 6.9*  MG  --  1.6* 2.0 1.9  --   --   PHOS  --   --   --   --  6.1*  --    Liver Function Tests: Recent Labs  Lab 06/23/20 0534 06/27/20 0503  AST 13* 11*  ALT <5 <5  ALKPHOS 42 37*  BILITOT 0.7 0.6  PROT 5.4* 4.9*  ALBUMIN 2.1* 1.9*   Recent Labs  Lab 06/21/20 0000  LIPASE 46   CBC: Recent Labs  Lab 06/21/20 0000 06/22/20 0445 06/23/20 1401 06/23/20 1850 06/24/20 0034 06/24/20 0634 06/27/20 0503  WBC 7.2   < > 7.9 8.8 8.1 7.2 5.6  NEUTROABS 6.8  --   --   --   --   --   --   HGB 9.7*   < > 9.2* 10.2* 9.2* 9.2* 8.5*  HCT 26.6*   < > 25.6* 28.7* 26.7* 26.8* 24.9*  MCV 90.5   < > 88.6 88.9 90.5 90.5 92.2  PLT 164   < > 133* 156 144* 139* 137*   < > = values in this interval not displayed.    CBG: Recent Labs  Lab 06/24/20 0752 06/24/20 1210 06/24/20 1635 06/25/20 1139 06/25/20 1609  GLUCAP 73 73 74 71 64*    Recent Results (from the past 240 hour(s))  Respiratory Panel by RT PCR (Flu A&B, Covid) - Nasopharyngeal Swab     Status: None   Collection Time: 06/21/20 12:05 PM   Specimen: Nasopharyngeal Swab  Result Value Ref Range Status   SARS Coronavirus 2 by RT PCR NEGATIVE NEGATIVE Final    Comment: (NOTE) SARS-CoV-2 target nucleic acids are NOT DETECTED.  The SARS-CoV-2 RNA is generally detectable in upper respiratoy specimens during the acute phase of infection. The lowest concentration of SARS-CoV-2 viral copies this assay can detect is 131 copies/mL. A negative result does not preclude SARS-Cov-2 infection and should not  be used as the sole basis for treatment or other patient management decisions. A negative result may occur with  improper specimen collection/handling, submission of specimen other than nasopharyngeal swab, presence of viral mutation(s) within the areas targeted by this assay, and inadequate number of viral copies (<131 copies/mL). A negative result must be combined with clinical observations, patient history, and epidemiological information. The expected result is Negative.  Fact Sheet for Patients:  PinkCheek.be  Fact Sheet for Healthcare Providers:  GravelBags.it  This test is no t yet approved or cleared by the [place] FDA and  has been authorized for detection and/or diagnosis of SARS-CoV-2 by FDA under an Emergency Use Authorization (EUA). This EUA will remain  in effect (meaning this test can be used) for the duration of the COVID-19 declaration under Section 564(b)(1) of the Act, 21 U.S.C. section 360bbb-3(b)(1), unless the authorization is terminated or revoked sooner.     Influenza A by PCR NEGATIVE NEGATIVE Final   Influenza B by PCR NEGATIVE NEGATIVE Final    Comment: (NOTE) The Xpert Xpress SARS-CoV-2/FLU/RSV assay is intended as an aid in  the diagnosis of influenza from Nasopharyngeal swab specimens and  should not be used as a sole basis for treatment. Nasal washings and  aspirates are unacceptable for Xpert Xpress SARS-CoV-2/FLU/RSV  testing.  Fact Sheet for Patients: PinkCheek.be  Fact Sheet for Healthcare Providers: GravelBags.it  This test is not yet approved or cleared by the [place] FDA and  has been authorized for detection and/or diagnosis of SARS-CoV-2 by  FDA under an Emergency Use Authorization (EUA). This EUA will remain  in effect (meaning this test can be used) for the duration of the  Covid-19 declaration under Section  564(b)(1) of the Act, 21  U.S.C. section 360bbb-3(b)(1), unless the authorization is  terminated or revoked. Performed at [facility], [address] [place], [place] [address]   C Difficile Quick Screen w PCR reflex     Status: None   Collection Time: 06/22/20 12:35 PM   Specimen: STOOL  Result Value Ref Range Status   C Diff antigen NEGATIVE NEGATIVE Final   C Diff toxin NEGATIVE NEGATIVE Final   C Diff interpretation No C. difficile detected.  Final  Comment: Performed at [facility], [address] [place], [place] [address]  Gastrointestinal Panel by PCR , Stool     Status: None   Collection Time: 06/26/20  9:17 AM   Specimen: Stool  Result Value Ref Range Status   Campylobacter species NOT DETECTED NOT DETECTED Final   Plesimonas shigelloides NOT DETECTED NOT DETECTED Final   Salmonella species NOT DETECTED NOT DETECTED Final   Yersinia enterocolitica NOT DETECTED NOT DETECTED Final   Vibrio species NOT DETECTED NOT DETECTED Final   Vibrio cholerae NOT DETECTED NOT DETECTED Final   Enteroaggregative E coli (EAEC) NOT DETECTED NOT DETECTED Final   Enteropathogenic E coli (EPEC) NOT DETECTED NOT DETECTED Final   Enterotoxigenic E coli (ETEC) NOT DETECTED NOT DETECTED Final   Shiga like toxin producing E coli (STEC) NOT DETECTED NOT DETECTED Final   Shigella/Enteroinvasive E coli (EIEC) NOT DETECTED NOT DETECTED Final   Cryptosporidium NOT DETECTED NOT DETECTED Final   Cyclospora cayetanensis NOT DETECTED NOT DETECTED Final   Entamoeba histolytica NOT DETECTED NOT DETECTED Final   Giardia lamblia NOT DETECTED NOT DETECTED Final   Adenovirus F40/41 NOT DETECTED NOT DETECTED Final   Astrovirus NOT DETECTED NOT DETECTED Final   Norovirus GI/GII NOT DETECTED NOT DETECTED Final   Rotavirus A NOT DETECTED NOT DETECTED Final   Sapovirus (I, II, IV, and V) NOT DETECTED NOT DETECTED Final    Comment: Performed at [facility], [address]  [place], [place] [address]     Scheduled Meds: . calcitRIOL  0.25 mcg Oral Daily  . calcium acetate  1,334 mg Oral BID WC  . carvedilol  25 mg Oral BID WC  . Chlorhexidine Gluconate Cloth  6 each Topical Daily  . cloNIDine  0.1 mg Transdermal Weekly  . diltiazem  180 mg Oral Daily  . epoetin (EPOGEN/PROCRIT) injection  10,000 Units Subcutaneous Weekly  . feeding supplement  1 Container Oral TID BM  . gentamicin cream  1 application Topical Daily  . miconazole   Topical BID  . multivitamin  1 tablet Oral QAC breakfast  . pantoprazole (PROTONIX) IV  40 mg Intravenous Q12H   Continuous Infusions: . sodium chloride    . ceFEPime (MAXIPIME) IV Stopped (06/26/20 1415)  . dialysis solution 2.5% low-MG/low-CA    . fluconazole (DIFLUCAN) IV    . metronidazole 500 mg (06/27/20 0512)    Assessment/Plan:  1. Nausea, vomiting, diarrhea and abdominal pain.  Patient still not feeling well but not having abdominal pain today.  Still not able to eat much.  Empiric antibiotics with Maxipime and Flagyl.  Stool for C. difficile and comprehensive stool panel negative.  Ova and parasite still pending.  Empiric Protonix.  As needed nausea medications.  Ativan given yesterday which seemed to help a little bit.  As needed Ativan.  Patient does not want a NG tube for eating at this point. 2. Essential hypertension.  Continue clonidine patch, Coreg and diltiazem. 3. End-stage renal disease on peritoneal dialysis 4. Ritta Slot will start Diflucan IV 5. Electrolyte abnormalities with hypokalemia hypomagnesemia and hyponatremia.  Sodium one-point less than the normal range.  Potassium in the normal range. 6. Cervical cancer.  Follow-up with oncology as outpatient 7. Stage II sacral decubiti.  Developed during the hospital stay.  Encouraged changing positions.  Patient states that is from so much diarrhea and wiping.  Advised her to keep changing positions and ambulating with physical therapy.  Patient refused me taking a  picture of the  sacral wound for the medical record. 8. Anemia of chronic disease continue to monitor hemoglobin.  Pressure Injury 06/26/20 Sacrum Mid Stage 2 -  Partial thickness loss of dermis presenting as a shallow open injury with a red, pink wound bed without slough. open pink area (Active)  06/26/20 1900  Location: Sacrum  Location Orientation: Mid  Staging: Stage 2 -  Partial thickness loss of dermis presenting as a shallow open injury with a red, pink wound bed without slough.  Wound Description (Comments): open pink area  Present on Admission:        Code Status:     Code Status Orders  (From admission, onward)         Start     Ordered   06/21/20 0818  Full code  Continuous        06/21/20 0819        Code Status History    Date Active Date Inactive Code Status Order ID Comments User Context   04/08/2020 0959 04/08/2020 1627 Full Code [id number]  Mellody Drown, MD Inpatient   11/03/2019 2104 11/09/2019 2032 Full Code [id number]  Athena Masse, MD ED   10/29/2019 0243 11/01/2019 2028 Full Code [id number]  Mansy, Arvella Merles, MD ED   05/11/2018 2017 05/25/2018 2211 Full Code [id number]  Erlene Quan, NP Inpatient   05/11/2018 1711 05/11/2018 2017 Full Code [id number]  Delana Meyer, Dolores Lory, MD Inpatient   11/01/2017 0345 11/03/2017 1655 Full Code [id number]  Lance Coon, MD Inpatient   Advance Care Planning Activity     Family Communication: Husband at bedside Disposition Plan: Status is: Inpatient  Dispo:  Patient From: Home  Planned Disposition: Home  Expected discharge date: Unclear at this point since the patient still has not eaten very much since being here.  Medically stable for discharge: No, still unable to tolerate diet.  Consultants:  Nephrology  Antibiotics:  Maxipime  Flagyl  Diflucan  Time spent: 27 minutes  Early

## 2020-06-27 NOTE — Progress Notes (Signed)
[facility]  ROUNDING NOTE   Subjective:   [name] was admitted to [facility] on 06/21/2020 for Hypocalcemia [E83.51] Hypokalemia [E87.6] Hypomagnesemia [E83.42] Chronic abdominal pain [R10.9, G89.29] Intractable vomiting [R11.10] Generalized weakness [R53.1] ESRD on peritoneal dialysis ([place]) [N18.6, Z99.2] Intractable nausea and vomiting [R11.2] Refractory nausea and vomiting [R11.2] Intractable diarrhea [R19.7]   Patient received peritoneal dialysis treatment last night, tolerated well.  She still has complaints of nausea and anorexia.  No episodes of vomiting.  Objective:  Vital signs in last 24 hours:  Temp:  [97.3 F (36.3 C)-99.1 F (37.3 C)] 98.8 F (37.1 C) (10/16 1141) Pulse Rate:  [74-103] 100 (10/16 1141) Resp:  [16-20] 16 (10/16 1141) BP: (146-216)/(64-93) 170/66 (10/16 1141) SpO2:  [82 %-100 %] 100 % (10/16 1141) Weight:  [66.6 kg] 66.6 kg (10/16 0500)  Weight change: 2.4 kg Filed Weights   06/20/20 2343 06/26/20 0500 06/27/20 0500  Weight: 56.7 kg 64.2 kg 66.6 kg    Intake/Output: I/O last 3 completed shifts: In: 1160 [P.O.:360; IV Piggyback:800] Out: 0    Intake/Output this shift:  No intake/output data recorded.  Physical Exam: General:  Awake, alert, in no acute distress  Head:  oral mucosal membranes appears dry  Eyes:  Anicteric  Neck: Supple  Lungs:   Lungs clear, respirations symmetrical, normal effort  Heart:  Regular rate and rhythm   Abdomen:  Soft,non distended  Extremities:  Trace peripheral edema.  Neurologic:  Oriented x3  Skin: No acute lesions or rashes  Access:  Peritoneal catheter    Basic Metabolic Panel: Recent Labs  Lab 06/21/20 0000 06/21/20 0000 06/21/20 0502 06/22/20 0445 06/22/20 0445 06/23/20 0534 06/24/20 0634 06/27/20 0503  NA 129*  --   --  130*  --  130* 133* 134*  K 2.9*  --   --  2.8*  --  3.3* 4.1 3.6  CL 90*  --   --  93*  --  96* 102 103  CO2 20*  --   --  25  --  19* 18* 19*   GLUCOSE 74  --   --  117*  --  101* 77 97  BUN 88*  --   --  90*  --  82* 83* 78*  CREATININE 18.12*  --   --  17.15*  --  15.18* 15.45* 15.03*  CALCIUM 6.6*   < >  --  7.0*   < > 6.9* 6.6* 6.9*  MG  --   --  1.6* 2.0  --  1.9  --   --   PHOS  --   --   --   --   --   --  6.1*  --    < > = values in this interval not displayed.    Liver Function Tests: Recent Labs  Lab 06/23/20 0534 06/27/20 0503  AST 13* 11*  ALT <5 <5  ALKPHOS 42 37*  BILITOT 0.7 0.6  PROT 5.4* 4.9*  ALBUMIN 2.1* 1.9*   Recent Labs  Lab 06/21/20 0000  LIPASE 46   No results for input(s): AMMONIA in the last 168 hours.  CBC: Recent Labs  Lab 06/21/20 0000 06/22/20 0445 06/23/20 1401 06/23/20 1850 06/24/20 0034 06/24/20 0634 06/27/20 0503  WBC 7.2   < > 7.9 8.8 8.1 7.2 5.6  NEUTROABS 6.8  --   --   --   --   --   --   HGB 9.7*   < > 9.2*  10.2* 9.2* 9.2* 8.5*  HCT 26.6*   < > 25.6* 28.7* 26.7* 26.8* 24.9*  MCV 90.5   < > 88.6 88.9 90.5 90.5 92.2  PLT 164   < > 133* 156 144* 139* 137*   < > = values in this interval not displayed.    Cardiac Enzymes: No results for input(s): CKTOTAL, CKMB, CKMBINDEX, TROPONINI in the last 168 hours.  BNP: Invalid input(s): POCBNP  CBG: Recent Labs  Lab 06/24/20 0752 06/24/20 1210 06/24/20 1635 06/25/20 1139 06/25/20 1609  GLUCAP 73 73 74 71 53*    Microbiology: Results for orders placed or performed during the hospital encounter of 06/21/20  Respiratory Panel by RT PCR (Flu A&B, Covid) - Nasopharyngeal Swab     Status: None   Collection Time: 06/21/20 12:05 PM   Specimen: Nasopharyngeal Swab  Result Value Ref Range Status   SARS Coronavirus 2 by RT PCR NEGATIVE NEGATIVE Final    Comment: (NOTE) SARS-CoV-2 target nucleic acids are NOT DETECTED.  The SARS-CoV-2 RNA is generally detectable in upper respiratoy specimens during the acute phase of infection. The lowest concentration of SARS-CoV-2 viral copies this assay can detect is 131  copies/mL. A negative result does not preclude SARS-Cov-2 infection and should not be used as the sole basis for treatment or other patient management decisions. A negative result may occur with  improper specimen collection/handling, submission of specimen other than nasopharyngeal swab, presence of viral mutation(s) within the areas targeted by this assay, and inadequate number of viral copies (<131 copies/mL). A negative result must be combined with clinical observations, patient history, and epidemiological information. The expected result is Negative.  Fact Sheet for Patients:  PinkCheek.be  Fact Sheet for Healthcare Providers:  GravelBags.it  This test is no t yet approved or cleared by the [place] FDA and  has been authorized for detection and/or diagnosis of SARS-CoV-2 by FDA under an Emergency Use Authorization (EUA). This EUA will remain  in effect (meaning this test can be used) for the duration of the COVID-19 declaration under Section 564(b)(1) of the Act, 21 U.S.C. section 360bbb-3(b)(1), unless the authorization is terminated or revoked sooner.     Influenza A by PCR NEGATIVE NEGATIVE Final   Influenza B by PCR NEGATIVE NEGATIVE Final    Comment: (NOTE) The Xpert Xpress SARS-CoV-2/FLU/RSV assay is intended as an aid in  the diagnosis of influenza from Nasopharyngeal swab specimens and  should not be used as a sole basis for treatment. Nasal washings and  aspirates are unacceptable for Xpert Xpress SARS-CoV-2/FLU/RSV  testing.  Fact Sheet for Patients: PinkCheek.be  Fact Sheet for Healthcare Providers: GravelBags.it  This test is not yet approved or cleared by the [place] FDA and  has been authorized for detection and/or diagnosis of SARS-CoV-2 by  FDA under an Emergency Use Authorization (EUA). This EUA will remain  in effect (meaning  this test can be used) for the duration of the  Covid-19 declaration under Section 564(b)(1) of the Act, 21  U.S.C. section 360bbb-3(b)(1), unless the authorization is  terminated or revoked. Performed at [facility], [address] [place], [place] [address]   C Difficile Quick Screen w PCR reflex     Status: None   Collection Time: 06/22/20 12:35 PM   Specimen: STOOL  Result Value Ref Range Status   C Diff antigen NEGATIVE NEGATIVE Final   C Diff toxin NEGATIVE NEGATIVE Final   C Diff interpretation No C. difficile detected.  Final  Comment: Performed at [facility], [address] [place], [place] [address]  Gastrointestinal Panel by PCR , Stool     Status: None   Collection Time: 06/26/20  9:17 AM   Specimen: Stool  Result Value Ref Range Status   Campylobacter species NOT DETECTED NOT DETECTED Final   Plesimonas shigelloides NOT DETECTED NOT DETECTED Final   Salmonella species NOT DETECTED NOT DETECTED Final   Yersinia enterocolitica NOT DETECTED NOT DETECTED Final   Vibrio species NOT DETECTED NOT DETECTED Final   Vibrio cholerae NOT DETECTED NOT DETECTED Final   Enteroaggregative E coli (EAEC) NOT DETECTED NOT DETECTED Final   Enteropathogenic E coli (EPEC) NOT DETECTED NOT DETECTED Final   Enterotoxigenic E coli (ETEC) NOT DETECTED NOT DETECTED Final   Shiga like toxin producing E coli (STEC) NOT DETECTED NOT DETECTED Final   Shigella/Enteroinvasive E coli (EIEC) NOT DETECTED NOT DETECTED Final   Cryptosporidium NOT DETECTED NOT DETECTED Final   Cyclospora cayetanensis NOT DETECTED NOT DETECTED Final   Entamoeba histolytica NOT DETECTED NOT DETECTED Final   Giardia lamblia NOT DETECTED NOT DETECTED Final   Adenovirus F40/41 NOT DETECTED NOT DETECTED Final   Astrovirus NOT DETECTED NOT DETECTED Final   Norovirus GI/GII NOT DETECTED NOT DETECTED Final   Rotavirus A NOT DETECTED NOT DETECTED Final   Sapovirus (I, II, IV, and V) NOT DETECTED NOT  DETECTED Final    Comment: Performed at [facility], [address] [place], [place] [address]    Coagulation Studies: No results for input(s): LABPROT, INR in the last 72 hours.  Urinalysis: No results for input(s): COLORURINE, LABSPEC, PHURINE, GLUCOSEU, HGBUR, BILIRUBINUR, KETONESUR, PROTEINUR, UROBILINOGEN, NITRITE, LEUKOCYTESUR in the last 72 hours.  Invalid input(s): APPERANCEUR    Imaging: No results found.   Medications:   . sodium chloride    . ceFEPime (MAXIPIME) IV Stopped (06/26/20 1415)  . dialysis solution 2.5% low-MG/low-CA    . fluconazole (DIFLUCAN) IV    . metronidazole 500 mg (06/27/20 0512)   . calcitRIOL  0.25 mcg Oral Daily  . calcium acetate  1,334 mg Oral BID WC  . carvedilol  25 mg Oral BID WC  . Chlorhexidine Gluconate Cloth  6 each Topical Daily  . cloNIDine  0.1 mg Transdermal Weekly  . diltiazem  180 mg Oral Daily  . epoetin (EPOGEN/PROCRIT) injection  10,000 Units Subcutaneous Weekly  . feeding supplement  1 Container Oral TID BM  . gentamicin cream  1 application Topical Daily  . miconazole   Topical BID  . multivitamin  1 tablet Oral QAC breakfast  . pantoprazole (PROTONIX) IV  40 mg Intravenous Q12H   sodium chloride, acetaminophen **OR** acetaminophen, diphenhydrAMINE, diphenoxylate-atropine, hydrALAZINE, labetalol, LORazepam, ondansetron **OR** ondansetron (ZOFRAN) IV, promethazine  Assessment/ Plan:  [name] is a 64 y.o. black female with end stage renal disease on peritoneal dialysis, cervical cancer, hypertension, diabetes mellitus type II, peripheral vascular disease, hyperlipidemia, who is admitted to [facility] on 06/21/2020 for Hypocalcemia [E83.51] Hypokalemia [E87.6] Hypomagnesemia [E83.42] Chronic abdominal pain [R10.9, G89.29] Intractable vomiting [R11.10] Generalized weakness [R53.1] ESRD on peritoneal dialysis ([place]) [N18.6, Z99.2] Intractable nausea and vomiting [R11.2] Refractory nausea and vomiting  [R11.2] Intractable diarrhea [R19.7]  [facility] 66kg CCPD 9 hours, 4 exchanges, 2566mL fills with last fill of 2036mL with icodextran.   # End Stage Renal Disease on peritoneal dialysis.  Patient tolerated peritoneal dialysis well last night We will continue PD treatment again  tonight  #  Hypertension:  Home regimen of carvedilol and diltiazem.  Blood pressure readings better today, still stays slightly above the lower -On Clonidine patch, and as needed antihypertensives  # Hypokalemia: with GI losses. On oral potassium supplementation at home.  -Potassium stays stable , 3.6  # Hyponatremia: secondary to GI losses and kidney failure. _ Sodium 134 today # Anemia with chronic kidney disease:  Required PRBC transfusion during this admission Hemoglobin 8.5 Will continue Epogen 10,000 units subcu weekly  #Secondary Hyperparathyroidism with hypocalcemia: Patient is on calcitriol and PhosLo   LOS: 6 Tracy Jennings 10/16/202111:48 AM

## 2020-06-27 NOTE — Progress Notes (Signed)
The patient had Peritoneal dialysis over the night. The patient had nausea over the night no episodes of vomiting yet dry heaving episodes. Stage II pressure injury to sacrum found. WOCN consulted. The patient is Aox4. Flat affect facial expressions.

## 2020-06-28 DIAGNOSIS — B370 Candidal stomatitis: Secondary | ICD-10-CM | POA: Diagnosis not present

## 2020-06-28 DIAGNOSIS — N186 End stage renal disease: Secondary | ICD-10-CM | POA: Diagnosis not present

## 2020-06-28 DIAGNOSIS — I10 Essential (primary) hypertension: Secondary | ICD-10-CM | POA: Diagnosis not present

## 2020-06-28 DIAGNOSIS — R112 Nausea with vomiting, unspecified: Secondary | ICD-10-CM | POA: Diagnosis not present

## 2020-06-28 MED ORDER — CALCIUM CARBONATE ANTACID 500 MG PO CHEW
1.0000 | CHEWABLE_TABLET | Freq: Three times a day (TID) | ORAL | Status: DC
  Administered 2020-06-28 – 2020-07-01 (×9): 200 mg via ORAL
  Filled 2020-06-28 (×9): qty 1

## 2020-06-28 NOTE — Consult Note (Signed)
WOC Nurse Consult Note: Reason for Consult:Partial thickness skin loss at sarum Wound type:Stage 2 pressure injury. Pressure Injury POA: Yes.  (Patient reports this area has been present for approximately 7-10 days) Measurement: 3cm x 1cm x 0.1cm Wound MZT:AEWY, moist Drainage (amount, consistency, odor) None (dry) Periwound: intact, dry Dressing procedure/placement/frequency: Currently a silicone foam dressing is in place. I will add covering the lesion with an antimicrobial nonadherent wound contact layer (xeroform) prior to placement of the silicone foam.  Turning from side to side is in place. I will provide a pressure redistribution chair pad for her use during periods of extended sitting. Note: Patient declined Dr. Leslye Peer obtaining a photograph to document this area yesterday on 10/16.  [facility] nursing team will not follow, but will remain available to this patient, the nursing and medical teams.  Please re-consult if needed. Thanks, Maudie Flakes, MSN, RN, [place], [facility]  Pager# [phone]

## 2020-06-28 NOTE — Progress Notes (Signed)
Patient ID: Tracy Jennings, female   DOB: 1956/06/11, 64 y.o.   MRN: [id number] [facility] PROGRESS NOTE  Tracy Jennings QZR:[id number] DOB: 1955-10-10 DOA: 06/21/2020 PCP: Sharyne Peach, MD  HPI/Subjective: Patient sitting up and felt better this morning than she has. She ate a little breakfast. As she was talking to me then she started dry heaving. I asked the nurse to get her something for nausea. Came in with persistent nausea vomiting and diarrhea.  Objective: Vitals:   06/28/20 0900 06/28/20 1136  BP: (!) 183/78 (!) 187/80  Pulse: 97 (!) 102  Resp: 16 16  Temp: 98.5 F (36.9 C) 98.7 F (37.1 C)  SpO2: 100% 100%    Intake/Output Summary (Last 24 hours) at 06/28/2020 1228 Last data filed at 06/28/2020 1107 Gross per 24 hour  Intake 381.43 ml  Output --  Net 381.43 ml   Filed Weights   06/20/20 2343 06/26/20 0500 06/27/20 0500  Weight: 56.7 kg 64.2 kg 66.6 kg    ROS: Review of Systems  Respiratory: Negative for shortness of breath.   Cardiovascular: Negative for chest pain.  Gastrointestinal: Positive for diarrhea and nausea. Negative for abdominal pain and vomiting.   Exam: Physical Exam HENT:     Head: Normocephalic.     Mouth/Throat:     Pharynx: No oropharyngeal exudate.     Comments: Thrush still present. Eyes:     General: Lids are normal.     Conjunctiva/sclera: Conjunctivae normal.     Pupils: Pupils are equal, round, and reactive to light.  Cardiovascular:     Rate and Rhythm: Normal rate and regular rhythm.     Heart sounds: Normal heart sounds, S1 normal and S2 normal.  Pulmonary:     Breath sounds: No decreased breath sounds, wheezing, rhonchi or rales.  Abdominal:     Palpations: Abdomen is soft.     Tenderness: There is no abdominal tenderness.  Musculoskeletal:     Right lower leg: No swelling.     Left lower leg: No swelling.  Skin:    General: Skin is warm.     Comments: Stage II sacral decubiti  Neurological:     Mental  Status: She is alert and oriented to person, place, and time.       Data Reviewed: Basic Metabolic Panel: Recent Labs  Lab 06/22/20 0445 06/23/20 0534 06/24/20 0634 06/27/20 0503  NA 130* 130* 133* 134*  K 2.8* 3.3* 4.1 3.6  CL 93* 96* 102 103  CO2 25 19* 18* 19*  GLUCOSE 117* 101* 77 97  BUN 90* 82* 83* 78*  CREATININE 17.15* 15.18* 15.45* 15.03*  CALCIUM 7.0* 6.9* 6.6* 6.9*  MG 2.0 1.9  --   --   PHOS  --   --  6.1*  --    Liver Function Tests: Recent Labs  Lab 06/23/20 0534 06/27/20 0503  AST 13* 11*  ALT <5 <5  ALKPHOS 42 37*  BILITOT 0.7 0.6  PROT 5.4* 4.9*  ALBUMIN 2.1* 1.9*   CBC: Recent Labs  Lab 06/23/20 1401 06/23/20 1850 06/24/20 0034 06/24/20 0634 06/27/20 0503  WBC 7.9 8.8 8.1 7.2 5.6  HGB 9.2* 10.2* 9.2* 9.2* 8.5*  HCT 25.6* 28.7* 26.7* 26.8* 24.9*  MCV 88.6 88.9 90.5 90.5 92.2  PLT 133* 156 144* 139* 137*    CBG: Recent Labs  Lab 06/24/20 0752 06/24/20 1210 06/24/20 1635 06/25/20 1139 06/25/20 1609  GLUCAP 73 73 74 71 64*    Recent Results (  from the past 240 hour(s))  Respiratory Panel by RT PCR (Flu A&B, Covid) - Nasopharyngeal Swab     Status: None   Collection Time: 06/21/20 12:05 PM   Specimen: Nasopharyngeal Swab  Result Value Ref Range Status   SARS Coronavirus 2 by RT PCR NEGATIVE NEGATIVE Final    Comment: (NOTE) SARS-CoV-2 target nucleic acids are NOT DETECTED.  The SARS-CoV-2 RNA is generally detectable in upper respiratoy specimens during the acute phase of infection. The lowest concentration of SARS-CoV-2 viral copies this assay can detect is 131 copies/mL. A negative result does not preclude SARS-Cov-2 infection and should not be used as the sole basis for treatment or other patient management decisions. A negative result may occur with  improper specimen collection/handling, submission of specimen other than nasopharyngeal swab, presence of viral mutation(s) within the areas targeted by this assay, and  inadequate number of viral copies (<131 copies/mL). A negative result must be combined with clinical observations, patient history, and epidemiological information. The expected result is Negative.  Fact Sheet for Patients:  PinkCheek.be  Fact Sheet for Healthcare Providers:  GravelBags.it  This test is no t yet approved or cleared by the [place] FDA and  has been authorized for detection and/or diagnosis of SARS-CoV-2 by FDA under an Emergency Use Authorization (EUA). This EUA will remain  in effect (meaning this test can be used) for the duration of the COVID-19 declaration under Section 564(b)(1) of the Act, 21 U.S.C. section 360bbb-3(b)(1), unless the authorization is terminated or revoked sooner.     Influenza A by PCR NEGATIVE NEGATIVE Final   Influenza B by PCR NEGATIVE NEGATIVE Final    Comment: (NOTE) The Xpert Xpress SARS-CoV-2/FLU/RSV assay is intended as an aid in  the diagnosis of influenza from Nasopharyngeal swab specimens and  should not be used as a sole basis for treatment. Nasal washings and  aspirates are unacceptable for Xpert Xpress SARS-CoV-2/FLU/RSV  testing.  Fact Sheet for Patients: PinkCheek.be  Fact Sheet for Healthcare Providers: GravelBags.it  This test is not yet approved or cleared by the [place] FDA and  has been authorized for detection and/or diagnosis of SARS-CoV-2 by  FDA under an Emergency Use Authorization (EUA). This EUA will remain  in effect (meaning this test can be used) for the duration of the  Covid-19 declaration under Section 564(b)(1) of the Act, 21  U.S.C. section 360bbb-3(b)(1), unless the authorization is  terminated or revoked. Performed at [facility], [address] [place], [place] [address]   C Difficile Quick Screen w PCR reflex     Status: None   Collection Time: 06/22/20 12:35  PM   Specimen: STOOL  Result Value Ref Range Status   C Diff antigen NEGATIVE NEGATIVE Final   C Diff toxin NEGATIVE NEGATIVE Final   C Diff interpretation No C. difficile detected.  Final    Comment: Performed at [facility], [address] [place], [place] [address]  Gastrointestinal Panel by PCR , Stool     Status: None   Collection Time: 06/26/20  9:17 AM   Specimen: Stool  Result Value Ref Range Status   Campylobacter species NOT DETECTED NOT DETECTED Final   Plesimonas shigelloides NOT DETECTED NOT DETECTED Final   Salmonella species NOT DETECTED NOT DETECTED Final   Yersinia enterocolitica NOT DETECTED NOT DETECTED Final   Vibrio species NOT DETECTED NOT DETECTED Final   Vibrio cholerae NOT DETECTED NOT DETECTED Final   Enteroaggregative E coli (EAEC) NOT DETECTED  NOT DETECTED Final   Enteropathogenic E coli (EPEC) NOT DETECTED NOT DETECTED Final   Enterotoxigenic E coli (ETEC) NOT DETECTED NOT DETECTED Final   Shiga like toxin producing E coli (STEC) NOT DETECTED NOT DETECTED Final   Shigella/Enteroinvasive E coli (EIEC) NOT DETECTED NOT DETECTED Final   Cryptosporidium NOT DETECTED NOT DETECTED Final   Cyclospora cayetanensis NOT DETECTED NOT DETECTED Final   Entamoeba histolytica NOT DETECTED NOT DETECTED Final   Giardia lamblia NOT DETECTED NOT DETECTED Final   Adenovirus F40/41 NOT DETECTED NOT DETECTED Final   Astrovirus NOT DETECTED NOT DETECTED Final   Norovirus GI/GII NOT DETECTED NOT DETECTED Final   Rotavirus A NOT DETECTED NOT DETECTED Final   Sapovirus (I, II, IV, and V) NOT DETECTED NOT DETECTED Final    Comment: Performed at [facility], [address] [place], [place] [address]     Scheduled Meds: . calcitRIOL  0.25 mcg Oral Daily  . calcium acetate  1,334 mg Oral BID WC  . calcium carbonate  1 tablet Oral TID  . carvedilol  25 mg Oral BID WC  . Chlorhexidine Gluconate Cloth  6 each Topical Daily  . cloNIDine  0.1 mg Transdermal  Weekly  . diltiazem  180 mg Oral Daily  . epoetin (EPOGEN/PROCRIT) injection  10,000 Units Subcutaneous Weekly  . feeding supplement  1 Container Oral TID BM  . gentamicin cream  1 application Topical Daily  . miconazole   Topical BID  . multivitamin  1 tablet Oral QAC breakfast  . pantoprazole (PROTONIX) IV  40 mg Intravenous Q12H   Continuous Infusions: . sodium chloride    . ceFEPime (MAXIPIME) IV Stopped (06/27/20 1633)  . dialysis solution 2.5% low-MG/low-CA    . fluconazole (DIFLUCAN) IV Stopped (06/27/20 1703)  . metronidazole Stopped (06/28/20 0604)    Assessment/Plan:  1. Nausea, vomiting, diarrhea and abdominal pain. Patient feeling a little bit better today but started dry heaving when I was in the room. Asked nursing staff to give some IV medications for nausea. Empiric antibiotics of Maxipime and Flagyl. Ova and parasite negative. Stool for C. difficile and comprehensive panel negative. Patient needs to tolerate diet prior to disposition. 2. Thrush on IV Diflucan. IV Diflucan would cover thrush esophagitis if this were to be the case but the patient did refuse endoscopy early on the hospital stay. 3. Essential hypertension. On clonidine patch, Coreg and diltiazem 4. End-stage renal disease on peritoneal dialysis 5. Electrolyte abnormalities including hypokalemia, hypomagnesemia and hyponatremia. Monitor electrolytes intermittently. 6. Stage II sacral decubiti. See description below. Local wound care and changing positions needed. Explained she needs to get up and move around more. 7. Anemia of chronic disease. Continue to monitor hemoglobin 8. Cervical cancer. Follow-up with oncology as outpatient  Pressure Injury 06/26/20 Sacrum Mid Stage 2 -  Partial thickness loss of dermis presenting as a shallow open injury with a red, pink wound bed without slough. open pink area (Active)  06/26/20 1900  Location: Sacrum  Location Orientation: Mid  Staging: Stage 2 -  Partial  thickness loss of dermis presenting as a shallow open injury with a red, pink wound bed without slough.  Wound Description (Comments): open pink area  Present on Admission:        Code Status:     Code Status Orders  (From admission, onward)         Start     Ordered   06/21/20 0818  Full code  Continuous  06/21/20 0819        Code Status History    Date Active Date Inactive Code Status Order ID Comments User Context   04/08/2020 0959 04/08/2020 1627 Full Code [id number]  Mellody Drown, MD Inpatient   11/03/2019 2104 11/09/2019 2032 Full Code [id number]  Athena Masse, MD ED   10/29/2019 0243 11/01/2019 2028 Full Code [id number]  Mansy, Arvella Merles, MD ED   05/11/2018 2017 05/25/2018 2211 Full Code [id number]  Erlene Quan, NP Inpatient   05/11/2018 1711 05/11/2018 2017 Full Code [id number]  Delana [place] Dolores Lory, MD Inpatient   11/01/2017 0345 11/03/2017 1655 Full Code [id number]  Lance Coon, MD Inpatient   Advance Care Planning Activity     Family Communication: Tried to reach husband on the phone but mailbox is full. Disposition Plan: Status is: Inpatient  Dispo:  Patient From: Home  Planned Disposition: Home  Expected discharge date: 07/02/20  Medically stable for discharge: No, patient unable to tolerate diet at this point.  Consultants:  Nephrology  Antibiotics:  Diflucan  Maxipime  Flagyl  Time spent: 28 minutes  [place]

## 2020-06-28 NOTE — Progress Notes (Signed)
[facility]  ROUNDING NOTE   Subjective:   [name] was admitted to [facility] on 06/21/2020 for Hypocalcemia [E83.51] Hypokalemia [E87.6] Hypomagnesemia [E83.42] Chronic abdominal pain [R10.9, G89.29] Intractable vomiting [R11.10] Generalized weakness [R53.1] ESRD on peritoneal dialysis ([place]) [N18.6, Z99.2] Intractable nausea and vomiting [R11.2] Refractory nausea and vomiting [R11.2] Intractable diarrhea [R19.7]  Patient appears more comfortable today, reports tolerating peritoneal dialysis last night.  Nausea and vomiting are getting better.  She still has occasional diarrhea, last episode was yesterday.  She was able to consume part of the breakfast this morning.  Objective:  Vital signs in last 24 hours:  Temp:  [97.8 F (36.6 C)-98.7 F (37.1 C)] 98.7 F (37.1 C) (10/17 1136) Pulse Rate:  [97-106] 102 (10/17 1136) Resp:  [16-20] 16 (10/17 1136) BP: (169-199)/(65-99) 187/80 (10/17 1136) SpO2:  [100 %] 100 % (10/17 1136)  Weight change:  Filed Weights   06/20/20 2343 06/26/20 0500 06/27/20 0500  Weight: 56.7 kg 64.2 kg 66.6 kg    Intake/Output: I/O last 3 completed shifts: In: 144 [IV RXVQMGQQP:619] Out: 0    Intake/Output this shift:  Total I/O In: 148.4 [IV Piggyback:148.4] Out: -   Physical Exam: General:  Lying in bed, in no acute distress  Head:  Normocephalic, atraumatic, oral mucosal membranes appears dry  Eyes:  Sclerae and conjunctivae clear  Neck: Supple  Lungs:   Respirations even and unlabored, lungs clear  Heart:  S1S2, no rubs or gallops ,regular rate and rhythm   Abdomen:  Soft,non distended  Extremities:  Trace peripheral edema.  Neurologic:  Alert, awake, oriented x3  Skin: No acute lesions or rashes  Access:  Peritoneal catheter    Basic Metabolic Panel: Recent Labs  Lab 06/22/20 0445 06/22/20 0445 06/23/20 0534 06/24/20 0634 06/27/20 0503  NA 130*  --  130* 133* 134*  K 2.8*  --  3.3* 4.1 3.6  CL 93*  --   96* 102 103  CO2 25  --  19* 18* 19*  GLUCOSE 117*  --  101* 77 97  BUN 90*  --  82* 83* 78*  CREATININE 17.15*  --  15.18* 15.45* 15.03*  CALCIUM 7.0*   < > 6.9* 6.6* 6.9*  MG 2.0  --  1.9  --   --   PHOS  --   --   --  6.1*  --    < > = values in this interval not displayed.    Liver Function Tests: Recent Labs  Lab 06/23/20 0534 06/27/20 0503  AST 13* 11*  ALT <5 <5  ALKPHOS 42 37*  BILITOT 0.7 0.6  PROT 5.4* 4.9*  ALBUMIN 2.1* 1.9*   No results for input(s): LIPASE, AMYLASE in the last 168 hours. No results for input(s): AMMONIA in the last 168 hours.  CBC: Recent Labs  Lab 06/23/20 1401 06/23/20 1850 06/24/20 0034 06/24/20 0634 06/27/20 0503  WBC 7.9 8.8 8.1 7.2 5.6  HGB 9.2* 10.2* 9.2* 9.2* 8.5*  HCT 25.6* 28.7* 26.7* 26.8* 24.9*  MCV 88.6 88.9 90.5 90.5 92.2  PLT 133* 156 144* 139* 137*    Cardiac Enzymes: No results for input(s): CKTOTAL, CKMB, CKMBINDEX, TROPONINI in the last 168 hours.  BNP: Invalid input(s): POCBNP  CBG: Recent Labs  Lab 06/24/20 0752 06/24/20 1210 06/24/20 1635 06/25/20 1139 06/25/20 1609  GLUCAP 73 73 74 71 18*    Microbiology: Results for orders placed or performed during the hospital encounter of 06/21/20  Respiratory Panel by  RT PCR (Flu A&B, Covid) - Nasopharyngeal Swab     Status: None   Collection Time: 06/21/20 12:05 PM   Specimen: Nasopharyngeal Swab  Result Value Ref Range Status   SARS Coronavirus 2 by RT PCR NEGATIVE NEGATIVE Final    Comment: (NOTE) SARS-CoV-2 target nucleic acids are NOT DETECTED.  The SARS-CoV-2 RNA is generally detectable in upper respiratoy specimens during the acute phase of infection. The lowest concentration of SARS-CoV-2 viral copies this assay can detect is 131 copies/mL. A negative result does not preclude SARS-Cov-2 infection and should not be used as the sole basis for treatment or other patient management decisions. A negative result may occur with  improper specimen  collection/handling, submission of specimen other than nasopharyngeal swab, presence of viral mutation(s) within the areas targeted by this assay, and inadequate number of viral copies (<131 copies/mL). A negative result must be combined with clinical observations, patient history, and epidemiological information. The expected result is Negative.  Fact Sheet for Patients:  PinkCheek.be  Fact Sheet for Healthcare Providers:  GravelBags.it  This test is no t yet approved or cleared by the [place] FDA and  has been authorized for detection and/or diagnosis of SARS-CoV-2 by FDA under an Emergency Use Authorization (EUA). This EUA will remain  in effect (meaning this test can be used) for the duration of the COVID-19 declaration under Section 564(b)(1) of the Act, 21 U.S.C. section 360bbb-3(b)(1), unless the authorization is terminated or revoked sooner.     Influenza A by PCR NEGATIVE NEGATIVE Final   Influenza B by PCR NEGATIVE NEGATIVE Final    Comment: (NOTE) The Xpert Xpress SARS-CoV-2/FLU/RSV assay is intended as an aid in  the diagnosis of influenza from Nasopharyngeal swab specimens and  should not be used as a sole basis for treatment. Nasal washings and  aspirates are unacceptable for Xpert Xpress SARS-CoV-2/FLU/RSV  testing.  Fact Sheet for Patients: PinkCheek.be  Fact Sheet for Healthcare Providers: GravelBags.it  This test is not yet approved or cleared by the [place] FDA and  has been authorized for detection and/or diagnosis of SARS-CoV-2 by  FDA under an Emergency Use Authorization (EUA). This EUA will remain  in effect (meaning this test can be used) for the duration of the  Covid-19 declaration under Section 564(b)(1) of the Act, 21  U.S.C. section 360bbb-3(b)(1), unless the authorization is  terminated or revoked. Performed at [facility], [address] [place], [place] [address]   C Difficile Quick Screen w PCR reflex     Status: None   Collection Time: 06/22/20 12:35 PM   Specimen: STOOL  Result Value Ref Range Status   C Diff antigen NEGATIVE NEGATIVE Final   C Diff toxin NEGATIVE NEGATIVE Final   C Diff interpretation No C. difficile detected.  Final    Comment: Performed at [facility], [address] [place], [place] [address]  Gastrointestinal Panel by PCR , Stool     Status: None   Collection Time: 06/26/20  9:17 AM   Specimen: Stool  Result Value Ref Range Status   Campylobacter species NOT DETECTED NOT DETECTED Final   Plesimonas shigelloides NOT DETECTED NOT DETECTED Final   Salmonella species NOT DETECTED NOT DETECTED Final   Yersinia enterocolitica NOT DETECTED NOT DETECTED Final   Vibrio species NOT DETECTED NOT DETECTED Final   Vibrio cholerae NOT DETECTED NOT DETECTED Final   Enteroaggregative E coli (EAEC) NOT DETECTED NOT DETECTED Final   Enteropathogenic E coli (EPEC)  NOT DETECTED NOT DETECTED Final   Enterotoxigenic E coli (ETEC) NOT DETECTED NOT DETECTED Final   Shiga like toxin producing E coli (STEC) NOT DETECTED NOT DETECTED Final   Shigella/Enteroinvasive E coli (EIEC) NOT DETECTED NOT DETECTED Final   Cryptosporidium NOT DETECTED NOT DETECTED Final   Cyclospora cayetanensis NOT DETECTED NOT DETECTED Final   Entamoeba histolytica NOT DETECTED NOT DETECTED Final   Giardia lamblia NOT DETECTED NOT DETECTED Final   Adenovirus F40/41 NOT DETECTED NOT DETECTED Final   Astrovirus NOT DETECTED NOT DETECTED Final   Norovirus GI/GII NOT DETECTED NOT DETECTED Final   Rotavirus A NOT DETECTED NOT DETECTED Final   Sapovirus (I, II, IV, and V) NOT DETECTED NOT DETECTED Final    Comment: Performed at [facility], [address] [place], [place] [address]    Coagulation Studies: No results for input(s): LABPROT, INR in the last 72 hours.  Urinalysis: No  results for input(s): COLORURINE, LABSPEC, PHURINE, GLUCOSEU, HGBUR, BILIRUBINUR, KETONESUR, PROTEINUR, UROBILINOGEN, NITRITE, LEUKOCYTESUR in the last 72 hours.  Invalid input(s): APPERANCEUR    Imaging: No results found.   Medications:   . sodium chloride    . ceFEPime (MAXIPIME) IV Stopped (06/27/20 1633)  . dialysis solution 2.5% low-MG/low-CA    . fluconazole (DIFLUCAN) IV 100 mg (06/28/20 1341)  . metronidazole Stopped (06/28/20 0604)   . calcitRIOL  0.25 mcg Oral Daily  . calcium acetate  1,334 mg Oral BID WC  . calcium carbonate  1 tablet Oral TID  . carvedilol  25 mg Oral BID WC  . Chlorhexidine Gluconate Cloth  6 each Topical Daily  . cloNIDine  0.1 mg Transdermal Weekly  . diltiazem  180 mg Oral Daily  . epoetin (EPOGEN/PROCRIT) injection  10,000 Units Subcutaneous Weekly  . feeding supplement  1 Container Oral TID BM  . gentamicin cream  1 application Topical Daily  . miconazole   Topical BID  . multivitamin  1 tablet Oral QAC breakfast  . pantoprazole (PROTONIX) IV  40 mg Intravenous Q12H   sodium chloride, acetaminophen **OR** acetaminophen, diphenhydrAMINE, diphenoxylate-atropine, hydrALAZINE, labetalol, LORazepam, ondansetron **OR** ondansetron (ZOFRAN) IV, promethazine  Assessment/ Plan:  [name] is a 64 y.o. black female with end stage renal disease on peritoneal dialysis, cervical cancer, hypertension, diabetes mellitus type II, peripheral vascular disease, hyperlipidemia, who is admitted to [facility] on 06/21/2020 for Hypocalcemia [E83.51] Hypokalemia [E87.6] Hypomagnesemia [E83.42] Chronic abdominal pain [R10.9, G89.29] Intractable vomiting [R11.10] Generalized weakness [R53.1] ESRD on peritoneal dialysis ([place]) [N18.6, Z99.2] Intractable nausea and vomiting [R11.2] Refractory nausea and vomiting [R11.2] Intractable diarrhea [R19.7]  [facility] 66kg CCPD 9 hours, 4 exchanges, 2552mL fills with last fill of 2092mL  with icodextran.   # End Stage Renal Disease on peritoneal dialysis.  Patient got peritoneal dialysis treatment last night We will plan for PD again tonight  #  Hypertension:  Home regimen of carvedilol and diltiazem.  Blood pressure readings above the goal -On Clonidine patch and carvedilol PO -As needed antihypertensives hydralazine and labetalol  # Hypokalemia: with GI losses. On oral potassium supplementation at home.  -Potassium stays stable , 3.6  # Hyponatremia: secondary to GI losses and kidney failure. _ Sodium 134 on 06/27/20  # Anemia with chronic kidney disease:  Required PRBC transfusion during this admission Hemoglobin 8.5 Will continue Epogen 10,000 units subcu weekly  #Secondary Hyperparathyroidism with hypocalcemia: Patient is on calcitriol and PhosLo Calcium 6.9 on 06/27/20   LOS: 7 Margaretann Abate 10/17/20213:08  PM

## 2020-06-28 NOTE — TOC Initial Note (Signed)
Transition of Care [facility]) - Initial/Assessment Note    Patient Details  Name: [place] MRN: [id number] Date of Birth: [birth year]  Transition of [facility]) CM/SW Contact:    Harriet Masson, RN Phone [phone] 06/28/2020, 3:42 PM  Clinical Narrative:                 RN spoke with pt and spouse at bedside. Introduced [employer] services and the purpose for today's visit. High risk assessment completed and pt verified agency of choice for the recommended PT services. Pt has utilized [facility] for PT services in the past and has requested them once again for services. Spoke with Corene Cornea at [facility] for pending HHPT in the home.   Husband provided additional information as pt very sleepy. States support system is good with friends and family who is able to provide food when needed, spouse able to transport pt to all her medical appointments and pt is able to afford her medications. No other needs at this times.   TOC will continue to follow.   Expected Discharge Plan: [place] Barriers to Discharge: Continued Medical Work up   Patient Goals and CMS Choice   CMS Medicare.gov Compare Post Acute Care list provided to:: Patient Choice offered to / list presented to : Patient  Expected Discharge Plan and Services Expected Discharge Plan: [place] Choice: [place] arrangements for the past 2 months: [place]: PT University: [place] ([place] Date [place]: 06/28/20   Representative spoke with at [place]: Corene Cornea  Prior Living Arrangements/Services Living arrangements for the past 2 months: [place] Lives with:: Spouse Patient language and need for interpreter reviewed:: Yes Do you feel safe going back to the place where you live?: Yes      Need for Family Participation in Patient Care: Yes (Comment) Care  giver support system in place?: Yes (comment) Current home services: Home PT Criminal Activity/Legal Involvement Pertinent to Current Situation/Hospitalization: No - Comment as needed  Activities of Daily Living Home Assistive Devices/Equipment: Cane (specify quad or straight) ADL Screening (condition at time of admission) Patient's cognitive ability adequate to safely complete daily activities?: Yes Is the patient deaf or have difficulty hearing?: No Does the patient have difficulty seeing, even when wearing glasses/contacts?: No Does the patient have difficulty concentrating, remembering, or making decisions?: No Patient able to express need for assistance with ADLs?: Yes Does the patient have difficulty dressing or bathing?: No Independently performs ADLs?: Yes (appropriate for developmental age) Does the patient have difficulty walking or climbing stairs?: Yes Weakness of Legs: Both Weakness of Arms/Hands: None  Permission Sought/Granted Permission sought to share information with : Case Manager Permission granted to share information with : Yes, Verbal Permission Granted  Share Information with NAME: Corene Cornea  Permission granted to share info w AGENCY: [facility]        Emotional Assessment Appearance:: Appears stated age Attitude/Demeanor/Rapport: Engaged Affect (typically observed): Accepting, Stable Orientation: : Oriented to Self, Oriented to Place, Oriented to  Time, Oriented to Situation   Psych Involvement: No (comment)  Admission diagnosis:  Hypocalcemia [E83.51] Hypokalemia [E87.6] Hypomagnesemia [E83.42] Chronic abdominal pain [R10.9, G89.29] Intractable vomiting [R11.10] Generalized weakness [R53.1] ESRD on peritoneal dialysis (  [place]) [N18.6, Z99.2] Intractable nausea and vomiting [R11.2] Refractory nausea and vomiting [R11.2] Intractable diarrhea [R19.7] Patient Active Problem List   Diagnosis Date Noted  . Pressure injury of skin 06/27/2020  .  Thrush   . Anemia of chronic disease   . Palliative care by specialist   . Generalized abdominal pain   . Acute blood loss anemia   . Intractable vomiting 06/21/2020  . Hypokalemia 06/21/2020  . Hyponatremia 06/21/2020  . Hypomagnesemia 06/21/2020  . Intractable nausea and vomiting 06/21/2020  . Nausea vomiting and diarrhea 06/21/2020  . 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 Goals of care, counseling/discussion 03/20/2020  . Cervical cancer ([place]) 03/11/2020  . Orthostatic hypotension   . Syncope 11/04/2019  . 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 Type 2 diabetes mellitus with other specified complication ([place]) 17/91/5056  . ESRD on peritoneal dialysis ([place]) 11/03/2019  . Unable to care for self 11/03/2019  . Accelerated hypertension 11/03/2019  . Metabolic acidosis, increased anion gap (IAG)   . COVID-19   . Generalized weakness   . Recurrent syncope 10/28/2019  . 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 Complication of vascular access for dialysis 05/11/2018  . Respiratory arrest ([place]) 05/11/2018  . Hyperlipidemia 04/12/2018  . Pancreatitis, recurrent 11/01/2017  . Essential hypertension 11/01/2017  . ESRD (end stage renal disease) ([place]) 11/01/2017  . Recurrent pancreatitis 11/01/2017   PCP:  Sharyne Peach, MD Pharmacy:   [id number] [place], [address] [place] [address] Phone: [phone] Fax: [phone]     Social Determinants of Health (SDOH) Interventions    Readmission Risk Interventions Readmission Risk Prevention Plan 06/28/2020 06/28/2020  Transportation Screening - Complete  Medication Review Press photographer) - Complete  PCP or Specialist appointment within 3-5 days of discharge - Complete  HRI or [place] - Complete  Palliative Care Screening Complete Not [place] Not Applicable Not Applicable  Some recent  data might be hidden

## 2020-06-29 DIAGNOSIS — R627 Adult failure to thrive: Secondary | ICD-10-CM

## 2020-06-29 DIAGNOSIS — I10 Essential (primary) hypertension: Secondary | ICD-10-CM | POA: Diagnosis not present

## 2020-06-29 DIAGNOSIS — B370 Candidal stomatitis: Secondary | ICD-10-CM | POA: Diagnosis not present

## 2020-06-29 DIAGNOSIS — R112 Nausea with vomiting, unspecified: Secondary | ICD-10-CM | POA: Diagnosis not present

## 2020-06-29 LAB — CBC
HCT: 25.8 % — ABNORMAL LOW (ref 36.0–46.0)
Hemoglobin: 8.9 g/dL — ABNORMAL LOW (ref 12.0–15.0)
MCH: 31.7 pg (ref 26.0–34.0)
MCHC: 34.5 g/dL (ref 30.0–36.0)
MCV: 91.8 fL (ref 80.0–100.0)
Platelets: 127 10*3/uL — ABNORMAL LOW (ref 150–400)
RBC: 2.81 MIL/uL — ABNORMAL LOW (ref 3.87–5.11)
RDW: 15.4 % (ref 11.5–15.5)
WBC: 4.9 10*3/uL (ref 4.0–10.5)
nRBC: 0 % (ref 0.0–0.2)

## 2020-06-29 LAB — COMPREHENSIVE METABOLIC PANEL
ALT: <5 U/L (ref 0–44)
AST: 14 U/L — ABNORMAL LOW (ref 15–41)
Albumin: 1.8 g/dL — ABNORMAL LOW (ref 3.5–5.0)
Alkaline Phosphatase: 35 U/L — ABNORMAL LOW (ref 38–126)
Anion gap: 13 (ref 5–15)
BUN: 61 mg/dL — ABNORMAL HIGH (ref 8–23)
CO2: 21 mmol/L — ABNORMAL LOW (ref 22–32)
Calcium: 6.8 mg/dL — ABNORMAL LOW (ref 8.9–10.3)
Chloride: 101 mmol/L (ref 98–111)
Creatinine, Ser: 12.95 mg/dL — ABNORMAL HIGH (ref 0.44–1.00)
GFR, Estimated: 3 mL/min — ABNORMAL LOW (ref >60)
Glucose, Bld: 99 mg/dL (ref 70–99)
Potassium: 2.9 mmol/L — ABNORMAL LOW (ref 3.5–5.1)
Sodium: 135 mmol/L (ref 135–145)
Total Bilirubin: 0.6 mg/dL (ref 0.3–1.2)
Total Protein: 5 g/dL — ABNORMAL LOW (ref 6.5–8.1)

## 2020-06-29 LAB — MAGNESIUM: Magnesium: 1.5 mg/dL — ABNORMAL LOW (ref 1.7–2.4)

## 2020-06-29 MED ORDER — CALCITRIOL 0.25 MCG PO CAPS
0.5000 ug | ORAL_CAPSULE | Freq: Every day | ORAL | Status: DC
  Administered 2020-06-30 – 2020-07-01 (×2): 0.5 ug via ORAL
  Filled 2020-06-29 (×2): qty 2

## 2020-06-29 MED ORDER — MAGNESIUM SULFATE 2 GM/50ML IV SOLN
2.0000 g | Freq: Once | INTRAVENOUS | Status: AC
  Administered 2020-06-29: 2 g via INTRAVENOUS
  Filled 2020-06-29: qty 50

## 2020-06-29 MED ORDER — ASCORBIC ACID 500 MG PO TABS
250.0000 mg | ORAL_TABLET | Freq: Two times a day (BID) | ORAL | Status: DC
  Administered 2020-06-30 – 2020-07-01 (×2): 250 mg via ORAL
  Filled 2020-06-29 (×2): qty 1

## 2020-06-29 MED ORDER — NEPRO/CARBSTEADY PO LIQD
237.0000 mL | Freq: Two times a day (BID) | ORAL | Status: DC
  Administered 2020-06-29 – 2020-06-30 (×2): 237 mL via ORAL

## 2020-06-29 MED ORDER — POTASSIUM CHLORIDE 10 MEQ/100ML IV SOLN
10.0000 meq | INTRAVENOUS | Status: AC
  Administered 2020-06-29 (×2): 10 meq via INTRAVENOUS
  Filled 2020-06-29 (×2): qty 100

## 2020-06-29 NOTE — Progress Notes (Signed)
[facility]  ROUNDING NOTE   Subjective:   [place] was admitted to [facility] on 06/21/2020 for Hypocalcemia [E83.51] Hypokalemia [E87.6] Hypomagnesemia [E83.42] Chronic abdominal pain [R10.9, G89.29] Intractable vomiting [R11.10] Generalized weakness [R53.1] ESRD on peritoneal dialysis ([place]) [N18.6, Z99.2] Intractable nausea and vomiting [R11.2] Refractory nausea and vomiting [R11.2] Intractable diarrhea [R19.7]  Patient is awake, alert, in no acute distress.  She reports her abdominal symptoms getting better.  She is able to consume about 25 % of her breakfast today. She received peritoneal dialysis last night, tolerated well.  Objective:  Vital signs in last 24 hours:  Temp:  [98.5 F (36.9 C)-99.1 F (37.3 C)] 99.1 F (37.3 C) (10/18 0738) Pulse Rate:  [85-100] 89 (10/18 0738) Resp:  [15-18] 15 (10/18 0738) BP: (168-188)/(65-94) 168/65 (10/18 0738) SpO2:  [100 %] 100 % (10/18 0738) Weight:  [68.4 kg] 68.4 kg (10/18 0500)  Weight change:  Filed Weights   06/26/20 0500 06/27/20 0500 06/29/20 0500  Weight: 64.2 kg 66.6 kg 68.4 kg    Intake/Output: I/O last 3 completed shifts: In: 203.2 [IV Piggyback:203.2] Out: 0    Intake/Output this shift:  No intake/output data recorded.  Physical Exam: General:  In no acute distress  Head:  oral mucosal membranes appears moist  Eyes:  Anicteric  Neck: Supple  Lungs:   Lungs clear bilaterally, respirations even and unlabored  Heart:  S1S2, no rubs or gallops ,regular rate and rhythm   Abdomen:  Soft,non distended, diffuse tenderness +  Extremities:  1+ peripheral edema.  Neurologic:  oriented x3  Skin: No acute lesions or rashes  Access:  Peritoneal catheter    Basic Metabolic Panel: Recent Labs  Lab 06/23/20 0534 06/23/20 0534 06/24/20 0634 06/27/20 0503 06/29/20 0528  NA 130*  --  133* 134* 135  K 3.3*  --  4.1 3.6 2.9*  CL 96*  --  102 103 101  CO2 19*  --  18* 19* 21*  GLUCOSE 101*  --   77 97 99  BUN 82*  --  83* 78* 61*  CREATININE 15.18*  --  15.45* 15.03* 12.95*  CALCIUM 6.9*   < > 6.6* 6.9* 6.8*  MG 1.9  --   --   --  1.5*  PHOS  --   --  6.1*  --   --    < > = values in this interval not displayed.    Liver Function Tests: Recent Labs  Lab 06/23/20 0534 06/27/20 0503 06/29/20 0528  AST 13* 11* 14*  ALT <5 <5 <5  ALKPHOS 42 37* 35*  BILITOT 0.7 0.6 0.6  PROT 5.4* 4.9* 5.0*  ALBUMIN 2.1* 1.9* 1.8*   No results for input(s): LIPASE, AMYLASE in the last 168 hours. No results for input(s): AMMONIA in the last 168 hours.  CBC: Recent Labs  Lab 06/23/20 1850 06/24/20 0034 06/24/20 0634 06/27/20 0503 06/29/20 0528  WBC 8.8 8.1 7.2 5.6 4.9  HGB 10.2* 9.2* 9.2* 8.5* 8.9*  HCT 28.7* 26.7* 26.8* 24.9* 25.8*  MCV 88.9 90.5 90.5 92.2 91.8  PLT 156 144* 139* 137* 127*    Cardiac Enzymes: No results for input(s): CKTOTAL, CKMB, CKMBINDEX, TROPONINI in the last 168 hours.  BNP: Invalid input(s): POCBNP  CBG: Recent Labs  Lab 06/24/20 0752 06/24/20 1210 06/24/20 1635 06/25/20 1139 06/25/20 1609  GLUCAP 73 73 74 71 40*    Microbiology: Results for orders placed or performed during the hospital encounter of 06/21/20  Respiratory  Panel by RT PCR (Flu A&B, Covid) - Nasopharyngeal Swab     Status: None   Collection Time: 06/21/20 12:05 PM   Specimen: Nasopharyngeal Swab  Result Value Ref Range Status   SARS Coronavirus 2 by RT PCR NEGATIVE NEGATIVE Final    Comment: (NOTE) SARS-CoV-2 target nucleic acids are NOT DETECTED.  The SARS-CoV-2 RNA is generally detectable in upper respiratoy specimens during the acute phase of infection. The lowest concentration of SARS-CoV-2 viral copies this assay can detect is 131 copies/mL. A negative result does not preclude SARS-Cov-2 infection and should not be used as the sole basis for treatment or other patient management decisions. A negative result may occur with  improper specimen collection/handling,  submission of specimen other than nasopharyngeal swab, presence of viral mutation(s) within the areas targeted by this assay, and inadequate number of viral copies (<131 copies/mL). A negative result must be combined with clinical observations, patient history, and epidemiological information. The expected result is Negative.  Fact Sheet for Patients:  PinkCheek.be  Fact Sheet for Healthcare Providers:  GravelBags.it  This test is no t yet approved or cleared by the [place] FDA and  has been authorized for detection and/or diagnosis of SARS-CoV-2 by FDA under an Emergency Use Authorization (EUA). This EUA will remain  in effect (meaning this test can be used) for the duration of the COVID-19 declaration under Section 564(b)(1) of the Act, 21 U.S.C. section 360bbb-3(b)(1), unless the authorization is terminated or revoked sooner.     Influenza A by PCR NEGATIVE NEGATIVE Final   Influenza B by PCR NEGATIVE NEGATIVE Final    Comment: (NOTE) The Xpert Xpress SARS-CoV-2/FLU/RSV assay is intended as an aid in  the diagnosis of influenza from Nasopharyngeal swab specimens and  should not be used as a sole basis for treatment. Nasal washings and  aspirates are unacceptable for Xpert Xpress SARS-CoV-2/FLU/RSV  testing.  Fact Sheet for Patients: PinkCheek.be  Fact Sheet for Healthcare Providers: GravelBags.it  This test is not yet approved or cleared by the [place] FDA and  has been authorized for detection and/or diagnosis of SARS-CoV-2 by  FDA under an Emergency Use Authorization (EUA). This EUA will remain  in effect (meaning this test can be used) for the duration of the  Covid-19 declaration under Section 564(b)(1) of the Act, 21  U.S.C. section 360bbb-3(b)(1), unless the authorization is  terminated or revoked. Performed at [facility], [address] [place], [place] [address]   C Difficile Quick Screen w PCR reflex     Status: None   Collection Time: 06/22/20 12:35 PM   Specimen: STOOL  Result Value Ref Range Status   C Diff antigen NEGATIVE NEGATIVE Final   C Diff toxin NEGATIVE NEGATIVE Final   C Diff interpretation No C. difficile detected.  Final    Comment: Performed at [facility], [address] [place], [place] [address]  Gastrointestinal Panel by PCR , Stool     Status: None   Collection Time: 06/26/20  9:17 AM   Specimen: Stool  Result Value Ref Range Status   Campylobacter species NOT DETECTED NOT DETECTED Final   Plesimonas shigelloides NOT DETECTED NOT DETECTED Final   Salmonella species NOT DETECTED NOT DETECTED Final   Yersinia enterocolitica NOT DETECTED NOT DETECTED Final   Vibrio species NOT DETECTED NOT DETECTED Final   Vibrio cholerae NOT DETECTED NOT DETECTED Final   Enteroaggregative E coli (EAEC) NOT DETECTED NOT DETECTED Final   Enteropathogenic E  coli (EPEC) NOT DETECTED NOT DETECTED Final   Enterotoxigenic E coli (ETEC) NOT DETECTED NOT DETECTED Final   Shiga like toxin producing E coli (STEC) NOT DETECTED NOT DETECTED Final   Shigella/Enteroinvasive E coli (EIEC) NOT DETECTED NOT DETECTED Final   Cryptosporidium NOT DETECTED NOT DETECTED Final   Cyclospora cayetanensis NOT DETECTED NOT DETECTED Final   Entamoeba histolytica NOT DETECTED NOT DETECTED Final   Giardia lamblia NOT DETECTED NOT DETECTED Final   Adenovirus F40/41 NOT DETECTED NOT DETECTED Final   Astrovirus NOT DETECTED NOT DETECTED Final   Norovirus GI/GII NOT DETECTED NOT DETECTED Final   Rotavirus A NOT DETECTED NOT DETECTED Final   Sapovirus (I, II, IV, and V) NOT DETECTED NOT DETECTED Final    Comment: Performed at [facility], [address] [place], [place] [address]    Coagulation Studies: No results for input(s): LABPROT, INR in the last 72 hours.  Urinalysis: No results for input(s):  COLORURINE, LABSPEC, PHURINE, GLUCOSEU, HGBUR, BILIRUBINUR, KETONESUR, PROTEINUR, UROBILINOGEN, NITRITE, LEUKOCYTESUR in the last 72 hours.  Invalid input(s): APPERANCEUR    Imaging: No results found.   Medications:   . sodium chloride 250 mL (06/29/20 0650)  . ceFEPime (MAXIPIME) IV 1 g (06/28/20 1559)  . dialysis solution 2.5% low-MG/low-CA    . fluconazole (DIFLUCAN) IV 100 mg (06/29/20 1226)  . metronidazole 500 mg (06/29/20 0651)   . [START ON 06/30/2020] vitamin C  250 mg Oral BID  . calcitRIOL  0.25 mcg Oral Daily  . calcium acetate  1,334 mg Oral BID WC  . calcium carbonate  1 tablet Oral TID  . carvedilol  25 mg Oral BID WC  . Chlorhexidine Gluconate Cloth  6 each Topical Daily  . cloNIDine  0.1 mg Transdermal Weekly  . diltiazem  180 mg Oral Daily  . epoetin (EPOGEN/PROCRIT) injection  10,000 Units Subcutaneous Weekly  . feeding supplement (NEPRO CARB STEADY)  237 mL Oral BID BM  . gentamicin cream  1 application Topical Daily  . miconazole   Topical BID  . multivitamin  1 tablet Oral QAC breakfast  . pantoprazole (PROTONIX) IV  40 mg Intravenous Q12H   sodium chloride, acetaminophen **OR** acetaminophen, diphenhydrAMINE, diphenoxylate-atropine, hydrALAZINE, labetalol, LORazepam, ondansetron **OR** ondansetron (ZOFRAN) IV, promethazine  Assessment/ Plan:  Tracy Jennings is a 64 y.o. black female with end stage renal disease on peritoneal dialysis, cervical cancer, hypertension, diabetes mellitus type II, peripheral vascular disease, hyperlipidemia, who is admitted to [facility] on 06/21/2020 for Hypocalcemia [E83.51] Hypokalemia [E87.6] Hypomagnesemia [E83.42] Chronic abdominal pain [R10.9, G89.29] Intractable vomiting [R11.10] Generalized weakness [R53.1] ESRD on peritoneal dialysis ([place]) [N18.6, Z99.2] Intractable nausea and vomiting [R11.2] Refractory nausea and vomiting [R11.2] Intractable diarrhea [R19.7]  [facility] 66kg CCPD  9 hours, 4 exchanges, 2524mL fills with last fill of 2059mL with icodextran.   # End Stage Renal Disease on peritoneal dialysis.  Patient tolerated peritoneal dialysis well last night We will plan for peritoneal dialysis again tonight  #  Hypertension:  Home regimen of carvedilol and diltiazem.  Blood pressure readings getting better, still stays above the goal -On Clonidine patch and carvedilol PO -Continue PRN antihypertensives hydralazine and labetalol  # Hypokalemia: with GI losses. On oral potassium supplementation at home.  -Potassium low today, 2.9 -Getting replaced with IV KCl  # Hyponatremia: secondary to GI losses and kidney failure. _ Sodium 135 -We will continue monitoring  # Anemia with chronic kidney disease:  Required PRBC transfusion during this  admission Hemoglobin 8.9 Will continue Epogen 10,000 units subcu weekly  #Secondary Hyperparathyroidism with hypocalcemia: Continue calcitriol and PhosLo Calcium 6.8 today   LOS: 8 [place] 10/18/202112:44 PM

## 2020-06-29 NOTE — Progress Notes (Addendum)
Nutrition Follow-up  DOCUMENTATION CODES:   Not applicable  INTERVENTION:   Nepro Shake po BID, each supplement provides 425 kcal and 19 grams protein  Rena-vite daily   Vitamin C 244m po BID  Liberalize diet   Pt at high refeed risk; recommend monitor potassium, magnesium and phosphorus labs daily as oral intake improves.   NUTRITION DIAGNOSIS:   Inadequate oral intake related to nausea, vomiting as evidenced by per patient/family report.  GOAL:   Patient will meet greater than or equal to 90% of their needs  MONITOR:   Diet advancement, PO intake, Labs, Weight trends, I & O's, Supplement acceptance  REASON FOR ASSESSMENT:   Malnutrition Screening Tool    ASSESSMENT:   64y.o. female  with past medical history of ESRD on peritoneal dialysis, cervical cancer (chemo and radiation), and HTN admitted on 06/21/2020 with nausea, vomiting, diarrhea and abdominal pain.   Met with pt in room today. Pt reports continued nausea and dry heaving. Pt has continued to have poor appetite and oral intake since admit; pt eating <25% of meals. Pt ate 1/2 of a boiled egg this morning and a bite of toast. Pt reports that she is willing to drink supplements in hospital; however, she does report that she has had diarrhea with supplements in the past. RD will add supplements and vitamins to support wound healing and replace losses from PD. RD will also liberalize pt's diet. Pt is refeeding. May need to consider post-pyloric nasogastric feeding tube if pt's oral intake does not improve. Per chart, pt up ~25lbs from her UBW of 125-127lbs.   Medications reviewed and include: vitamin C, calcitriol, phoslo, tums, epoetin, miconazole, rena-vite, protonix, cefepime, diflucan, metronidazole   Labs reviewed: K 2.9(L), BUN 61(H), creat 12.95(H), Ca 6.8(L) adj. 8.56(L), alb 1.8(L), Mg 1.5(L) P 6.1(H)- 10/13 Folate 5.1(L)- 10/12 Hgb 8.9(L), Hct 25.8(L)  NUTRITION - FOCUSED PHYSICAL EXAM:    Most  Recent Value  Orbital Region No depletion  Upper Arm Region No depletion  Thoracic and Lumbar Region No depletion  Buccal Region No depletion  Temple Region Mild depletion  Clavicle Bone Region Moderate depletion  Clavicle and Acromion Bone Region Moderate depletion  Scapular Bone Region No depletion  Dorsal Hand No depletion  Patellar Region Moderate depletion  Anterior Thigh Region Moderate depletion  Posterior Calf Region Severe depletion  Edema (RD Assessment) None  Hair Reviewed  Eyes Reviewed  Mouth Reviewed  Skin Reviewed  Nails Reviewed     Diet Order:   Diet Order            Diet regular Room service appropriate? Yes; Fluid consistency: Thin  Diet effective now                EDUCATION NEEDS:   No education needs have been identified at this time  Skin:  Skin Assessment: Reviewed RN Assessment (Stage II sacrum 3cm x 1cm x 0.1cm)  Last BM:  10/17- type 6  Height:   Ht Readings from Last 1 Encounters:  06/20/20 _0  (1.6 m)    Weight:   Wt Readings from Last 1 Encounters:  06/29/20 68.4 kg    Ideal Body Weight:  52.3 kg  BMI:  Body mass index is 26.71 kg/m.  Estimated Nutritional Needs:   Kcal:  1500-1700kcal/day  Protein:  75-85g/day  Fluid:  UOP +1L  CKoleen DistanceMS, RD, LDN Please refer to [facility]for RD and/or RD on-call/weekend/after hours pager

## 2020-06-29 NOTE — Progress Notes (Signed)
Patient ID: JOWANDA HEEG, female   DOB: 1955/10/31, 64 y.o.   MRN: [id number] [facility] PROGRESS NOTE  KESHAWNA DIX RJJ:[id number] DOB: 1956/08/14 DOA: 06/21/2020 PCP: Sharyne Peach, MD  HPI/Subjective: Patient only able little bit of toast and a little bit of an egg this morning.  Still not feeling great but feels better than she did when she came in.  Still having some diarrhea.  Objective: Vitals:   06/29/20 0536 06/29/20 0738  BP: (!) 188/80 (!) 168/65  Pulse: 85 89  Resp: 16 15  Temp: 99 F (37.2 C) 99.1 F (37.3 C)  SpO2: 100% 100%    Filed Weights   06/26/20 0500 06/27/20 0500 06/29/20 0500  Weight: 64.2 kg 66.6 kg 68.4 kg    ROS: Review of Systems  Respiratory: Negative for shortness of breath.   Cardiovascular: Positive for chest pain.  Gastrointestinal: Positive for diarrhea and nausea. Negative for abdominal pain and vomiting.   Exam: Physical Exam HENT:     Head: Normocephalic.     Mouth/Throat:     Pharynx: No oropharyngeal exudate.  Eyes:     General: Lids are normal.     Conjunctiva/sclera: Conjunctivae normal.     Pupils: Pupils are equal, round, and reactive to light.  Cardiovascular:     Rate and Rhythm: Normal rate and regular rhythm.     Heart sounds: Normal heart sounds, S1 normal and S2 normal.  Pulmonary:     Breath sounds: No decreased breath sounds, wheezing, rhonchi or rales.  Abdominal:     Palpations: Abdomen is soft.     Tenderness: There is no abdominal tenderness.  Musculoskeletal:     Right lower leg: No swelling.     Left lower leg: No swelling.  Skin:    General: Skin is warm.     Findings: No rash.  Neurological:     Mental Status: She is alert and oriented to person, place, and time.       Data Reviewed: Basic Metabolic Panel: Recent Labs  Lab 06/23/20 0534 06/24/20 0634 06/27/20 0503 06/29/20 0528  NA 130* 133* 134* 135  K 3.3* 4.1 3.6 2.9*  CL 96* 102 103 101  CO2 19* 18* 19* 21*  GLUCOSE  101* 77 97 99  BUN 82* 83* 78* 61*  CREATININE 15.18* 15.45* 15.03* 12.95*  CALCIUM 6.9* 6.6* 6.9* 6.8*  MG 1.9  --   --  1.5*  PHOS  --  6.1*  --   --    Liver Function Tests: Recent Labs  Lab 06/23/20 0534 06/27/20 0503 06/29/20 0528  AST 13* 11* 14*  ALT <5 <5 <5  ALKPHOS 42 37* 35*  BILITOT 0.7 0.6 0.6  PROT 5.4* 4.9* 5.0*  ALBUMIN 2.1* 1.9* 1.8*   CBC: Recent Labs  Lab 06/23/20 1850 06/24/20 0034 06/24/20 0634 06/27/20 0503 06/29/20 0528  WBC 8.8 8.1 7.2 5.6 4.9  HGB 10.2* 9.2* 9.2* 8.5* 8.9*  HCT 28.7* 26.7* 26.8* 24.9* 25.8*  MCV 88.9 90.5 90.5 92.2 91.8  PLT 156 144* 139* 137* 127*    CBG: Recent Labs  Lab 06/24/20 0752 06/24/20 1210 06/24/20 1635 06/25/20 1139 06/25/20 1609  GLUCAP 73 73 74 71 64*    Recent Results (from the past 240 hour(s))  Respiratory Panel by RT PCR (Flu A&B, Covid) - Nasopharyngeal Swab     Status: None   Collection Time: 06/21/20 12:05 PM   Specimen: Nasopharyngeal Swab  Result Value Ref Range Status  SARS Coronavirus 2 by RT PCR NEGATIVE NEGATIVE Final    Comment: (NOTE) SARS-CoV-2 target nucleic acids are NOT DETECTED.  The SARS-CoV-2 RNA is generally detectable in upper respiratoy specimens during the acute phase of infection. The lowest concentration of SARS-CoV-2 viral copies this assay can detect is 131 copies/mL. A negative result does not preclude SARS-Cov-2 infection and should not be used as the sole basis for treatment or other patient management decisions. A negative result may occur with  improper specimen collection/handling, submission of specimen other than nasopharyngeal swab, presence of viral mutation(s) within the areas targeted by this assay, and inadequate number of viral copies (<131 copies/mL). A negative result must be combined with clinical observations, patient history, and epidemiological information. The expected result is Negative.  Fact Sheet for Patients:   PinkCheek.be  Fact Sheet for Healthcare Providers:  GravelBags.it  This test is no t yet approved or cleared by the [place] FDA and  has been authorized for detection and/or diagnosis of SARS-CoV-2 by FDA under an Emergency Use Authorization (EUA). This EUA will remain  in effect (meaning this test can be used) for the duration of the COVID-19 declaration under Section 564(b)(1) of the Act, 21 U.S.C. section 360bbb-3(b)(1), unless the authorization is terminated or revoked sooner.     Influenza A by PCR NEGATIVE NEGATIVE Final   Influenza B by PCR NEGATIVE NEGATIVE Final    Comment: (NOTE) The Xpert Xpress SARS-CoV-2/FLU/RSV assay is intended as an aid in  the diagnosis of influenza from Nasopharyngeal swab specimens and  should not be used as a sole basis for treatment. Nasal washings and  aspirates are unacceptable for Xpert Xpress SARS-CoV-2/FLU/RSV  testing.  Fact Sheet for Patients: PinkCheek.be  Fact Sheet for Healthcare Providers: GravelBags.it  This test is not yet approved or cleared by the [place] FDA and  has been authorized for detection and/or diagnosis of SARS-CoV-2 by  FDA under an Emergency Use Authorization (EUA). This EUA will remain  in effect (meaning this test can be used) for the duration of the  Covid-19 declaration under Section 564(b)(1) of the Act, 21  U.S.C. section 360bbb-3(b)(1), unless the authorization is  terminated or revoked. Performed at [facility], [address] [place], [place] [address]   C Difficile Quick Screen w PCR reflex     Status: None   Collection Time: 06/22/20 12:35 PM   Specimen: STOOL  Result Value Ref Range Status   C Diff antigen NEGATIVE NEGATIVE Final   C Diff toxin NEGATIVE NEGATIVE Final   C Diff interpretation No C. difficile detected.  Final    Comment: Performed at [facility], [address] [place], [place] [address]  Gastrointestinal Panel by PCR , Stool     Status: None   Collection Time: 06/26/20  9:17 AM   Specimen: Stool  Result Value Ref Range Status   Campylobacter species NOT DETECTED NOT DETECTED Final   Plesimonas shigelloides NOT DETECTED NOT DETECTED Final   Salmonella species NOT DETECTED NOT DETECTED Final   Yersinia enterocolitica NOT DETECTED NOT DETECTED Final   Vibrio species NOT DETECTED NOT DETECTED Final   Vibrio cholerae NOT DETECTED NOT DETECTED Final   Enteroaggregative E coli (EAEC) NOT DETECTED NOT DETECTED Final   Enteropathogenic E coli (EPEC) NOT DETECTED NOT DETECTED Final   Enterotoxigenic E coli (ETEC) NOT DETECTED NOT DETECTED Final   Shiga like toxin producing E coli (STEC) NOT DETECTED NOT DETECTED Final   Shigella/Enteroinvasive E  coli (EIEC) NOT DETECTED NOT DETECTED Final   Cryptosporidium NOT DETECTED NOT DETECTED Final   Cyclospora cayetanensis NOT DETECTED NOT DETECTED Final   Entamoeba histolytica NOT DETECTED NOT DETECTED Final   Giardia lamblia NOT DETECTED NOT DETECTED Final   Adenovirus F40/41 NOT DETECTED NOT DETECTED Final   Astrovirus NOT DETECTED NOT DETECTED Final   Norovirus GI/GII NOT DETECTED NOT DETECTED Final   Rotavirus A NOT DETECTED NOT DETECTED Final   Sapovirus (I, II, IV, and V) NOT DETECTED NOT DETECTED Final    Comment: Performed at [facility], [address] [place], [place] [address]      Scheduled Meds: . [START ON 06/30/2020] vitamin C  250 mg Oral BID  . calcitRIOL  0.25 mcg Oral Daily  . calcium acetate  1,334 mg Oral BID WC  . calcium carbonate  1 tablet Oral TID  . carvedilol  25 mg Oral BID WC  . Chlorhexidine Gluconate Cloth  6 each Topical Daily  . cloNIDine  0.1 mg Transdermal Weekly  . diltiazem  180 mg Oral Daily  . epoetin (EPOGEN/PROCRIT) injection  10,000 Units Subcutaneous Weekly  . feeding supplement (NEPRO CARB STEADY)  237 mL Oral BID BM   . gentamicin cream  1 application Topical Daily  . miconazole   Topical BID  . multivitamin  1 tablet Oral QAC breakfast  . pantoprazole (PROTONIX) IV  40 mg Intravenous Q12H   Continuous Infusions: . sodium chloride 250 mL (06/29/20 0650)  . ceFEPime (MAXIPIME) IV 1 g (06/29/20 1351)  . dialysis solution 2.5% low-MG/low-CA    . fluconazole (DIFLUCAN) IV 100 mg (06/29/20 1226)  . metronidazole 500 mg (06/29/20 1348)    Assessment/Plan:  1. Nausea, vomiting, diarrhea and abdominal pain.  Patient starting to eat just a little bit.  Dietitian added Nepro.  Still needs to eat more in order to go home.  Complete 5 days of empiric antibiotics of Maxipime, Flagyl just in case SBP/colitis causing her symptoms.  Empiric Diflucan just in case thrush or esophagitis causing symptoms. 2. Thrush on IV Diflucan.  IV Diflucan would also cover thrush esophagitis.  Patient refused endoscopy during the hospital stay. 3. Failure to thrive and poor oral intake.  Appreciate dietitian consultation.  Continue to encourage eating even though not hungry and cannot tasted very well. 4. Essential hypertension on clonidine patch, Coreg and diltiazem 5. End-stage renal disease.  Continue peritoneal dialysis as per nephrology. 6. Hypokalemia and hypo-Magness anemia.  Replace magnesium IV and 2 rounds of IV potassium today. 7. Stage II sacral decubiti, present on admission as per wound care nurse.  See description below. 8. Anemia of chronic disease continue to monitor hemoglobin.  Today's hemoglobin up at 8.9. 9. Cervical cancer.  Follow-up as outpatient  Pressure Injury 06/26/20 Sacrum Mid Stage 2 -  Partial thickness loss of dermis presenting as a shallow open injury with a red, pink wound bed without slough. open pink area (Active)  06/26/20 1900  Location: Sacrum  Location Orientation: Mid  Staging: Stage 2 -  Partial thickness loss of dermis presenting as a shallow open injury with a red, pink wound bed without  slough.  Wound Description (Comments): open pink area  Present on Admission:        Code Status:     Code Status Orders  (From admission, onward)         Start     Ordered   06/21/20 0818  Full code  Continuous  06/21/20 0819        Code Status History    Date Active Date Inactive Code Status Order ID Comments User Context   04/08/2020 0959 04/08/2020 1627 Full Code [id number]  Mellody Drown, MD Inpatient   11/03/2019 2104 11/09/2019 2032 Full Code [id number]  Athena Masse, MD ED   10/29/2019 0243 11/01/2019 2028 Full Code [id number]  Mansy, Arvella Merles, MD ED   05/11/2018 2017 05/25/2018 2211 Full Code [id number]  Erlene Quan, NP Inpatient   05/11/2018 1711 05/11/2018 2017 Full Code [id number]  Katha Cabal, MD Inpatient   11/01/2017 0345 11/03/2017 1655 Full Code [id number]  Lance Coon, MD Inpatient   Advance Care Planning Activity     Family Communication: Spoke with husband on the phone Disposition Plan: Status is: Inpatient  Dispo:  Patient From: Home  Planned Disposition: Home  Expected discharge date: 07/02/20  Medically stable for discharge: No.  Needs to eat and tolerate diet prior to disposition.  Time spent: 27 minutes  [place]

## 2020-06-30 DIAGNOSIS — R627 Adult failure to thrive: Secondary | ICD-10-CM | POA: Diagnosis not present

## 2020-06-30 DIAGNOSIS — B370 Candidal stomatitis: Secondary | ICD-10-CM | POA: Diagnosis not present

## 2020-06-30 DIAGNOSIS — I10 Essential (primary) hypertension: Secondary | ICD-10-CM | POA: Diagnosis not present

## 2020-06-30 DIAGNOSIS — R112 Nausea with vomiting, unspecified: Secondary | ICD-10-CM | POA: Diagnosis not present

## 2020-06-30 LAB — MAGNESIUM: Magnesium: 1.8 mg/dL (ref 1.7–2.4)

## 2020-06-30 LAB — BASIC METABOLIC PANEL
Anion gap: 13 (ref 5–15)
BUN: 59 mg/dL — ABNORMAL HIGH (ref 8–23)
CO2: 20 mmol/L — ABNORMAL LOW (ref 22–32)
Calcium: 7.1 mg/dL — ABNORMAL LOW (ref 8.9–10.3)
Chloride: 101 mmol/L (ref 98–111)
Creatinine, Ser: 12.27 mg/dL — ABNORMAL HIGH (ref 0.44–1.00)
GFR, Estimated: 3 mL/min — ABNORMAL LOW (ref >60)
Glucose, Bld: 83 mg/dL (ref 70–99)
Potassium: 2.9 mmol/L — ABNORMAL LOW (ref 3.5–5.1)
Sodium: 134 mmol/L — ABNORMAL LOW (ref 135–145)

## 2020-06-30 LAB — PHOSPHORUS: Phosphorus: 4.7 mg/dL — ABNORMAL HIGH (ref 2.5–4.6)

## 2020-06-30 MED ORDER — POTASSIUM CHLORIDE 10 MEQ/100ML IV SOLN
10.0000 meq | INTRAVENOUS | Status: AC
  Administered 2020-06-30 (×2): 10 meq via INTRAVENOUS
  Filled 2020-06-30 (×2): qty 100

## 2020-06-30 MED ORDER — DELFLEX-LC/2.5% DEXTROSE 394 MOSM/L IP SOLN
INTRAPERITONEAL | Status: DC
  Filled 2020-06-30 (×2): qty 3000

## 2020-06-30 NOTE — Progress Notes (Signed)
PT Cancellation Note  Patient Details Name: Tracy Jennings MRN: [id number] DOB: Jan 11, 1956   Cancelled Treatment:    Reason Eval/Treat Not Completed: Patient declined to participate with PT services this date secondary to upset stomach.  Offered bed level therex with patient again declining.  Will attempt to see pt at a future date/time as medically appropriate.     Linus Salmons PT, DPT 06/30/20, 4:37 PM

## 2020-06-30 NOTE — TOC Progression Note (Signed)
Transition of Care [facility]) - Progression Note    Patient Details  Name: Tracy Jennings MRN: [id number] Date of Birth: Jun 09, 1956  Transition of Care [facility]) CM/SW [place], LCSW Phone Number: 06/30/2020, 12:20 PM  Clinical Narrative: [place] representative said patient's insurance was pulling up as [employer] primary and Medicare secondary with no [employer]. CSW called patient's husband. He said that [place] is primary, Medicare is secondary, and she has not had [facility] for a year or more. [facility] representative will notify the office and have them check into this.    Expected Discharge Plan: [place] Barriers to Discharge: Continued Medical Work up  Expected Discharge Plan and Services Expected Discharge Plan: [place] arrangements for the past 2 months: [place]: PT [place]: [place] ([place]) Date [place]: 06/28/20   Representative spoke with at [place]: [place] ([place]) Interventions    Readmission Risk Interventions Readmission Risk Prevention Plan 06/28/2020 06/28/2020  Transportation Screening - Complete  Medication Review Press photographer) - Complete  PCP or Specialist appointment within 3-5 days of discharge - Complete  HRI or [place] - Complete  Palliative Care Screening Complete Not [place] Not Applicable Not Applicable  Some recent data might be hidden

## 2020-06-30 NOTE — Care Management Important Message (Signed)
Important Message  Patient Details  Name: Tracy Jennings MRN: [id number] Date of Birth: 1956-03-13   Medicare Important Message Given:  Yes     Dannette Barbara 06/30/2020, 11:07 AM

## 2020-06-30 NOTE — Progress Notes (Signed)
[facility]  ROUNDING NOTE   Subjective:   [name] was admitted to [facility] on 06/21/2020 for Hypocalcemia [E83.51] Hypokalemia [E87.6] Hypomagnesemia [E83.42] Chronic abdominal pain [R10.9, G89.29] Intractable vomiting [R11.10] Generalized weakness [R53.1] ESRD on peritoneal dialysis ([place]) [N18.6, Z99.2] Intractable nausea and vomiting [R11.2] Refractory nausea and vomiting [R11.2] Intractable diarrhea [R19.7]  Patient received peritoneal dialysis last night, tolerated well. Her appetite and oral intake are improving.She still has intermittent nausea and abdominal discomfort.  Objective:  Vital signs in last 24 hours:  Temp:  [97.9 F (36.6 C)-98.6 F (37 C)] 98.6 F (37 C) (10/19 1123) Pulse Rate:  [69-82] 82 (10/19 1123) Resp:  [16-20] 16 (10/19 0839) BP: (109-173)/(60-71) 144/69 (10/19 1123) SpO2:  [98 %-100 %] 100 % (10/19 1123) Weight:  [66.4 kg] 66.4 kg (10/19 0426)  Weight change: -2.039 kg Filed Weights   06/27/20 0500 06/29/20 0500 06/30/20 0426  Weight: 66.6 kg 68.4 kg 66.4 kg    Intake/Output: I/O last 3 completed shifts: In: 610.6 [I.V.:110.6; IV Piggyback:500] Out: 0    Intake/Output this shift:  No intake/output data recorded.  Physical Exam: General:  In no acute distress  Head:  oral mucosal membranes appears moist  Eyes:  Anicteric  Neck: Supple  Lungs:   Lungs clear bilaterally, respirations even and unlabored  Heart:  regular rate and rhythm   Abdomen:  Soft,non distended, diffuse tenderness +  Extremities:  1+ Upper and lower extremity edema  Neurologic:  Awake,alert, oriented x3  Skin: No acute lesions or rashes  Access:  Peritoneal catheter    Basic Metabolic Panel: Recent Labs  Lab 06/24/20 0634 06/24/20 0634 06/27/20 0503 06/29/20 0528 06/30/20 0928  NA 133*  --  134* 135 134*  K 4.1  --  3.6 2.9* 2.9*  CL 102  --  103 101 101  CO2 18*  --  19* 21* 20*  GLUCOSE 77  --  97 99 83  BUN 83*  --  78* 61*  59*  CREATININE 15.45*  --  15.03* 12.95* 12.27*  CALCIUM 6.6*   < > 6.9* 6.8* 7.1*  MG  --   --   --  1.5* 1.8  PHOS 6.1*  --   --   --  4.7*   < > = values in this interval not displayed.    Liver Function Tests: Recent Labs  Lab 06/27/20 0503 06/29/20 0528  AST 11* 14*  ALT <5 <5  ALKPHOS 37* 35*  BILITOT 0.6 0.6  PROT 4.9* 5.0*  ALBUMIN 1.9* 1.8*   No results for input(s): LIPASE, AMYLASE in the last 168 hours. No results for input(s): AMMONIA in the last 168 hours.  CBC: Recent Labs  Lab 06/23/20 1850 06/24/20 0034 06/24/20 0634 06/27/20 0503 06/29/20 0528  WBC 8.8 8.1 7.2 5.6 4.9  HGB 10.2* 9.2* 9.2* 8.5* 8.9*  HCT 28.7* 26.7* 26.8* 24.9* 25.8*  MCV 88.9 90.5 90.5 92.2 91.8  PLT 156 144* 139* 137* 127*    Cardiac Enzymes: No results for input(s): CKTOTAL, CKMB, CKMBINDEX, TROPONINI in the last 168 hours.  BNP: Invalid input(s): POCBNP  CBG: Recent Labs  Lab 06/24/20 0752 06/24/20 1210 06/24/20 1635 06/25/20 1139 06/25/20 1609  GLUCAP 73 73 74 71 64*    Microbiology: Results for orders placed or performed during the hospital encounter of 06/21/20  Respiratory Panel by RT PCR (Flu A&B, Covid) - Nasopharyngeal Swab     Status: None   Collection Time: 06/21/20 12:05 PM  Specimen: Nasopharyngeal Swab  Result Value Ref Range Status   SARS Coronavirus 2 by RT PCR NEGATIVE NEGATIVE Final    Comment: (NOTE) SARS-CoV-2 target nucleic acids are NOT DETECTED.  The SARS-CoV-2 RNA is generally detectable in upper respiratoy specimens during the acute phase of infection. The lowest concentration of SARS-CoV-2 viral copies this assay can detect is 131 copies/mL. A negative result does not preclude SARS-Cov-2 infection and should not be used as the sole basis for treatment or other patient management decisions. A negative result may occur with  improper specimen collection/handling, submission of specimen other than nasopharyngeal swab, presence of viral  mutation(s) within the areas targeted by this assay, and inadequate number of viral copies (<131 copies/mL). A negative result must be combined with clinical observations, patient history, and epidemiological information. The expected result is Negative.  Fact Sheet for Patients:  PinkCheek.be  Fact Sheet for Healthcare Providers:  GravelBags.it  This test is no t yet approved or cleared by the [place] FDA and  has been authorized for detection and/or diagnosis of SARS-CoV-2 by FDA under an Emergency Use Authorization (EUA). This EUA will remain  in effect (meaning this test can be used) for the duration of the COVID-19 declaration under Section 564(b)(1) of the Act, 21 U.S.C. section 360bbb-3(b)(1), unless the authorization is terminated or revoked sooner.     Influenza A by PCR NEGATIVE NEGATIVE Final   Influenza B by PCR NEGATIVE NEGATIVE Final    Comment: (NOTE) The Xpert Xpress SARS-CoV-2/FLU/RSV assay is intended as an aid in  the diagnosis of influenza from Nasopharyngeal swab specimens and  should not be used as a sole basis for treatment. Nasal washings and  aspirates are unacceptable for Xpert Xpress SARS-CoV-2/FLU/RSV  testing.  Fact Sheet for Patients: PinkCheek.be  Fact Sheet for Healthcare Providers: GravelBags.it  This test is not yet approved or cleared by the [place] FDA and  has been authorized for detection and/or diagnosis of SARS-CoV-2 by  FDA under an Emergency Use Authorization (EUA). This EUA will remain  in effect (meaning this test can be used) for the duration of the  Covid-19 declaration under Section 564(b)(1) of the Act, 21  U.S.C. section 360bbb-3(b)(1), unless the authorization is  terminated or revoked. Performed at [facility], [address] [place], [place] [address]   C Difficile Quick Screen w PCR  reflex     Status: None   Collection Time: 06/22/20 12:35 PM   Specimen: STOOL  Result Value Ref Range Status   C Diff antigen NEGATIVE NEGATIVE Final   C Diff toxin NEGATIVE NEGATIVE Final   C Diff interpretation No C. difficile detected.  Final    Comment: Performed at [facility], [address] [place], [place] [address]  Gastrointestinal Panel by PCR , Stool     Status: None   Collection Time: 06/26/20  9:17 AM   Specimen: Stool  Result Value Ref Range Status   Campylobacter species NOT DETECTED NOT DETECTED Final   Plesimonas shigelloides NOT DETECTED NOT DETECTED Final   Salmonella species NOT DETECTED NOT DETECTED Final   Yersinia enterocolitica NOT DETECTED NOT DETECTED Final   Vibrio species NOT DETECTED NOT DETECTED Final   Vibrio cholerae NOT DETECTED NOT DETECTED Final   Enteroaggregative E coli (EAEC) NOT DETECTED NOT DETECTED Final   Enteropathogenic E coli (EPEC) NOT DETECTED NOT DETECTED Final   Enterotoxigenic E coli (ETEC) NOT DETECTED NOT DETECTED Final   Shiga like toxin producing E  coli (STEC) NOT DETECTED NOT DETECTED Final   Shigella/Enteroinvasive E coli (EIEC) NOT DETECTED NOT DETECTED Final   Cryptosporidium NOT DETECTED NOT DETECTED Final   Cyclospora cayetanensis NOT DETECTED NOT DETECTED Final   Entamoeba histolytica NOT DETECTED NOT DETECTED Final   Giardia lamblia NOT DETECTED NOT DETECTED Final   Adenovirus F40/41 NOT DETECTED NOT DETECTED Final   Astrovirus NOT DETECTED NOT DETECTED Final   Norovirus GI/GII NOT DETECTED NOT DETECTED Final   Rotavirus A NOT DETECTED NOT DETECTED Final   Sapovirus (I, II, IV, and V) NOT DETECTED NOT DETECTED Final    Comment: Performed at [facility], [address] [place], [place] [address]  Giardia, EIA; Ova/Parasite     Status: None (Preliminary result)   Collection Time: 06/26/20  9:17 AM   Specimen: Stool  Result Value Ref Range Status   Ova + Parasite Exam PENDING  Incomplete    Giardia Ag, Stl Negative Negative Final    Comment: (NOTE) Performed At: [facility] [place], [place] [id number] Rush Farmer MD MC:[id number]     Coagulation Studies: No results for input(s): LABPROT, INR in the last 72 hours.  Urinalysis: No results for input(s): COLORURINE, LABSPEC, PHURINE, GLUCOSEU, HGBUR, BILIRUBINUR, KETONESUR, PROTEINUR, UROBILINOGEN, NITRITE, LEUKOCYTESUR in the last 72 hours.  Invalid input(s): APPERANCEUR    Imaging: No results found.   Medications:   . sodium chloride 250 mL (06/30/20 1255)  . dialysis solution 2.5% low-MG/low-CA    . fluconazole (DIFLUCAN) IV 100 mg (06/30/20 1255)  . potassium chloride 10 mEq (06/30/20 1432)   . vitamin C  250 mg Oral BID  . calcitRIOL  0.5 mcg Oral Daily  . calcium acetate  1,334 mg Oral BID WC  . calcium carbonate  1 tablet Oral TID  . carvedilol  25 mg Oral BID WC  . Chlorhexidine Gluconate Cloth  6 each Topical Daily  . cloNIDine  0.1 mg Transdermal Weekly  . diltiazem  180 mg Oral Daily  . epoetin (EPOGEN/PROCRIT) injection  10,000 Units Subcutaneous Weekly  . feeding supplement (NEPRO CARB STEADY)  237 mL Oral BID BM  . gentamicin cream  1 application Topical Daily  . miconazole   Topical BID  . multivitamin  1 tablet Oral QAC breakfast  . pantoprazole (PROTONIX) IV  40 mg Intravenous Q12H   sodium chloride, acetaminophen **OR** acetaminophen, diphenhydrAMINE, diphenoxylate-atropine, hydrALAZINE, labetalol, LORazepam, ondansetron **OR** ondansetron (ZOFRAN) IV, promethazine  Assessment/ Plan:  [name] is a 64 y.o. black female with end stage renal disease on peritoneal dialysis, cervical cancer, hypertension, diabetes mellitus type II, peripheral vascular disease, hyperlipidemia, who is admitted to [facility] on 06/21/2020 for Hypocalcemia [E83.51] Hypokalemia [E87.6] Hypomagnesemia [E83.42] Chronic abdominal pain [R10.9, G89.29] Intractable vomiting  [R11.10] Generalized weakness [R53.1] ESRD on peritoneal dialysis ([place]) [N18.6, Z99.2] Intractable nausea and vomiting [R11.2] Refractory nausea and vomiting [R11.2] Intractable diarrhea [R19.7]  [facility] 66kg CCPD 9 hours, 4 exchanges, 2531mL fills with last fill of 204mL with icodextran.   # End Stage Renal Disease on peritoneal dialysis.  Patient tolerating peritoneal dialysis sessions We will continue PD treatments  #  Hypertension:  Home regimen of carvedilol and diltiazem.  Blood pressure readings getting better, still stays above the goal -On Clonidine patch and carvedilol PO -Continue PRN antihypertensives hydralazine and labetalol  # Hypokalemia: with GI losses. On oral potassium supplementation at home.  -Potassium stays low 2.9 - IV KCL for replacement  # Hyponatremia:  secondary to GI losses and kidney failure. _ Sodium 134 -We will continue close monitoring  # Anemia with chronic kidney disease:  Required PRBC transfusion during this admission Hemoglobin 8.9 on 06/29/2020 Will continue Epogen 10,000 units subcu weekly  #Secondary Hyperparathyroidism with hypocalcemia: Calcitriol  Dose increased starting today Continue PhosLo Calcium better, 7.1  Today Phosphorus  4.7    LOS: 9 Farmer Mccahill 10/19/20213:22 PM

## 2020-06-30 NOTE — Progress Notes (Signed)
Patient ID: CHERYL CHAY, female   DOB: [birth year], 64 y.o.   MRN: [id number] [facility] PROGRESS NOTE  BRITTNY SPANGLE INO:[id number] DOB: [birth year] DOA: 06/21/2020 PCP: Sharyne Peach, MD  HPI/Subjective: Patient ate a half a grilled cheese sandwich this morning.  She was starting to drink the Nepro drink.  He stated she ate a little piece of the chicken last night.  This was the most the patient has eaten the entire hospital course.  No nausea or vomiting or retching.  No diarrhea as of yesterday.  Objective: Vitals:   06/30/20 0839 06/30/20 1123  BP: (!) 173/66 (!) 144/69  Pulse: 75 82  Resp: 16   Temp: 97.9 F (36.6 C) 98.6 F (37 C)  SpO2: 100% 100%    Intake/Output Summary (Last 24 hours) at 06/30/2020 1349 Last data filed at 06/30/2020 0426 Gross per 24 hour  Intake 610.63 ml  Output --  Net 610.63 ml   Filed Weights   06/27/20 0500 06/29/20 0500 06/30/20 0426  Weight: 66.6 kg 68.4 kg 66.4 kg    ROS: Review of Systems  Respiratory: Negative for shortness of breath.   Cardiovascular: Negative for chest pain.  Gastrointestinal: Negative for abdominal pain, nausea and vomiting.   Exam: Physical Exam HENT:     Head: Normocephalic.     Mouth/Throat:     Pharynx: No oropharyngeal exudate.     Comments: Thrush on tongue Eyes:     General: Lids are normal.     Conjunctiva/sclera: Conjunctivae normal.     Pupils: Pupils are equal, round, and reactive to light.  Cardiovascular:     Rate and Rhythm: Normal rate and regular rhythm.     Heart sounds: Normal heart sounds, S1 normal and S2 normal.  Pulmonary:     Breath sounds: No decreased breath sounds, wheezing, rhonchi or rales.  Abdominal:     Palpations: Abdomen is soft.     Tenderness: There is no abdominal tenderness.  Musculoskeletal:     Right lower leg: No swelling.     Left lower leg: No swelling.  Skin:    General: Skin is warm.     Findings: No lesion.  Neurological:     Mental  Status: She is alert and oriented to person, place, and time.       Data Reviewed: Basic Metabolic Panel: Recent Labs  Lab 06/24/20 0634 06/27/20 0503 06/29/20 0528 06/30/20 0928  NA 133* 134* 135 134*  K 4.1 3.6 2.9* 2.9*  CL 102 103 101 101  CO2 18* 19* 21* 20*  GLUCOSE 77 97 99 83  BUN 83* 78* 61* 59*  CREATININE 15.45* 15.03* 12.95* 12.27*  CALCIUM 6.6* 6.9* 6.8* 7.1*  MG  --   --  1.5* 1.8  PHOS 6.1*  --   --  4.7*   Liver Function Tests: Recent Labs  Lab 06/27/20 0503 06/29/20 0528  AST 11* 14*  ALT <5 <5  ALKPHOS 37* 35*  BILITOT 0.6 0.6  PROT 4.9* 5.0*  ALBUMIN 1.9* 1.8*   CBC: Recent Labs  Lab 06/23/20 1850 06/24/20 0034 06/24/20 0634 06/27/20 0503 06/29/20 0528  WBC 8.8 8.1 7.2 5.6 4.9  HGB 10.2* 9.2* 9.2* 8.5* 8.9*  HCT 28.7* 26.7* 26.8* 24.9* 25.8*  MCV 88.9 90.5 90.5 92.2 91.8  PLT 156 144* 139* 137* 127*    CBG: Recent Labs  Lab 06/24/20 0752 06/24/20 1210 06/24/20 1635 06/25/20 1139 06/25/20 1609  GLUCAP 73 73 74 71 64*  Recent Results (from the past 240 hour(s))  Respiratory Panel by RT PCR (Flu A&B, Covid) - Nasopharyngeal Swab     Status: None   Collection Time: 06/21/20 12:05 PM   Specimen: Nasopharyngeal Swab  Result Value Ref Range Status   SARS Coronavirus 2 by RT PCR NEGATIVE NEGATIVE Final    Comment: (NOTE) SARS-CoV-2 target nucleic acids are NOT DETECTED.  The SARS-CoV-2 RNA is generally detectable in upper respiratoy specimens during the acute phase of infection. The lowest concentration of SARS-CoV-2 viral copies this assay can detect is 131 copies/mL. A negative result does not preclude SARS-Cov-2 infection and should not be used as the sole basis for treatment or other patient management decisions. A negative result may occur with  improper specimen collection/handling, submission of specimen other than nasopharyngeal swab, presence of viral mutation(s) within the areas targeted by this assay, and  inadequate number of viral copies (<131 copies/mL). A negative result must be combined with clinical observations, patient history, and epidemiological information. The expected result is Negative.  Fact Sheet for Patients:  PinkCheek.be  Fact Sheet for Healthcare Providers:  GravelBags.it  This test is no t yet approved or cleared by the [place] FDA and  has been authorized for detection and/or diagnosis of SARS-CoV-2 by FDA under an Emergency Use Authorization (EUA). This EUA will remain  in effect (meaning this test can be used) for the duration of the COVID-19 declaration under Section 564(b)(1) of the Act, 21 U.S.C. section 360bbb-3(b)(1), unless the authorization is terminated or revoked sooner.     Influenza A by PCR NEGATIVE NEGATIVE Final   Influenza B by PCR NEGATIVE NEGATIVE Final    Comment: (NOTE) The Xpert Xpress SARS-CoV-2/FLU/RSV assay is intended as an aid in  the diagnosis of influenza from Nasopharyngeal swab specimens and  should not be used as a sole basis for treatment. Nasal washings and  aspirates are unacceptable for Xpert Xpress SARS-CoV-2/FLU/RSV  testing.  Fact Sheet for Patients: PinkCheek.be  Fact Sheet for Healthcare Providers: GravelBags.it  This test is not yet approved or cleared by the [place] FDA and  has been authorized for detection and/or diagnosis of SARS-CoV-2 by  FDA under an Emergency Use Authorization (EUA). This EUA will remain  in effect (meaning this test can be used) for the duration of the  Covid-19 declaration under Section 564(b)(1) of the Act, 21  U.S.C. section 360bbb-3(b)(1), unless the authorization is  terminated or revoked. Performed at [facility], [address] [place], [place] [address]   C Difficile Quick Screen w PCR reflex     Status: None   Collection Time: 06/22/20 12:35  PM   Specimen: STOOL  Result Value Ref Range Status   C Diff antigen NEGATIVE NEGATIVE Final   C Diff toxin NEGATIVE NEGATIVE Final   C Diff interpretation No C. difficile detected.  Final    Comment: Performed at [facility], [address] [place], [place] [address]  Gastrointestinal Panel by PCR , Stool     Status: None   Collection Time: 06/26/20  9:17 AM   Specimen: Stool  Result Value Ref Range Status   Campylobacter species NOT DETECTED NOT DETECTED Final   Plesimonas shigelloides NOT DETECTED NOT DETECTED Final   Salmonella species NOT DETECTED NOT DETECTED Final   Yersinia enterocolitica NOT DETECTED NOT DETECTED Final   Vibrio species NOT DETECTED NOT DETECTED Final   Vibrio cholerae NOT DETECTED NOT DETECTED Final   Enteroaggregative E coli (EAEC)  NOT DETECTED NOT DETECTED Final   Enteropathogenic E coli (EPEC) NOT DETECTED NOT DETECTED Final   Enterotoxigenic E coli (ETEC) NOT DETECTED NOT DETECTED Final   Shiga like toxin producing E coli (STEC) NOT DETECTED NOT DETECTED Final   Shigella/Enteroinvasive E coli (EIEC) NOT DETECTED NOT DETECTED Final   Cryptosporidium NOT DETECTED NOT DETECTED Final   Cyclospora cayetanensis NOT DETECTED NOT DETECTED Final   Entamoeba histolytica NOT DETECTED NOT DETECTED Final   Giardia lamblia NOT DETECTED NOT DETECTED Final   Adenovirus F40/41 NOT DETECTED NOT DETECTED Final   Astrovirus NOT DETECTED NOT DETECTED Final   Norovirus GI/GII NOT DETECTED NOT DETECTED Final   Rotavirus A NOT DETECTED NOT DETECTED Final   Sapovirus (I, II, IV, and V) NOT DETECTED NOT DETECTED Final    Comment: Performed at [facility], [address] [place], [place] [address]  Giardia, EIA; Ova/Parasite     Status: None (Preliminary result)   Collection Time: 06/26/20  9:17 AM   Specimen: Stool  Result Value Ref Range Status   Ova + Parasite Exam PENDING  Incomplete   Giardia Ag, Stl Negative Negative Final    Comment:  (NOTE) Performed At: [facility] [place], [place] [id number] Rush Farmer MD UU:[id number]       Scheduled Meds:  vitamin C  250 mg Oral BID   calcitRIOL  0.5 mcg Oral Daily   calcium acetate  1,334 mg Oral BID WC   calcium carbonate  1 tablet Oral TID   carvedilol  25 mg Oral BID WC   Chlorhexidine Gluconate Cloth  6 each Topical Daily   cloNIDine  0.1 mg Transdermal Weekly   diltiazem  180 mg Oral Daily   epoetin (EPOGEN/PROCRIT) injection  10,000 Units Subcutaneous Weekly   feeding supplement (NEPRO CARB STEADY)  237 mL Oral BID BM   gentamicin cream  1 application Topical Daily   miconazole   Topical BID   multivitamin  1 tablet Oral QAC breakfast   pantoprazole (PROTONIX) IV  40 mg Intravenous Q12H   Continuous Infusions:  sodium chloride 250 mL (06/30/20 1255)   dialysis solution 2.5% low-MG/low-CA     fluconazole (DIFLUCAN) IV 100 mg (06/30/20 1255)   potassium chloride 10 mEq (06/30/20 1255)    Assessment/Plan:  1. Nausea, vomiting, diarrhea and abdominal pain.  Patient started eating more.  Since today is the first day that she is actually eaten we will watch today and reevaluate tomorrow.  Continue Nepro drinks.  Patient completed empiric 5 days of Maxipime and Flagyl just in case SBP/colitis causing her symptoms.  Empiric Diflucan just in case thrush esophagitis causing symptoms.  Could be secondary to recent chemotherapy. 2. Thrush on IV Diflucan.  Would give a longer course of Diflucan upon going home just to cover thrush esophagitis.  Patient did refuse endoscopy during this hospital stay. 3. Failure to thrive and poor oral intake.  Today the patient has started eating a little bit.  Encourage eating in order to potentially go home tomorrow 4. Essential hypertension.  Continue clonidine patch, Coreg and diltiazem 5. End-stage renal disease.  Continue peritoneal dialysis as per nephrology 6. Hypokalemia give another 2  rounds of potassium today. 7. Hypomagnesemia.  Magnesium low normal.  Would rather not give oral magnesium secondary to this potentially causing diarrhea. 8. Stage II sacral decubiti, present on admission as per wound care nurse.  See description below 9. Anemia of chronic disease.  Continue to monitor hemoglobin.  Check hemoglobin tomorrow. 10. Cervical cancer.  Follow-up with outpatient oncology  Pressure Injury 06/26/20 Sacrum Mid Stage 2 -  Partial thickness loss of dermis presenting as a shallow open injury with a red, pink wound bed without slough. open pink area (Active)  06/26/20 1900  Location: Sacrum  Location Orientation: Mid  Staging: Stage 2 -  Partial thickness loss of dermis presenting as a shallow open injury with a red, pink wound bed without slough.  Wound Description (Comments): open pink area  Present on Admission:        Code Status:     Code Status Orders  (From admission, onward)         Start     Ordered   06/21/20 0818  Full code  Continuous        06/21/20 0819        Code Status History    Date Active Date Inactive Code Status Order ID Comments User Context   04/08/2020 0959 04/08/2020 1627 Full Code [id number]  Mellody Drown, MD Inpatient   11/03/2019 2104 11/09/2019 2032 Full Code [id number]  Athena Masse, MD ED   10/29/2019 0243 11/01/2019 2028 Full Code [id number]  Mansy, Arvella Merles, MD ED   05/11/2018 2017 05/25/2018 2211 Full Code [id number]  Erlene Quan, NP Inpatient   05/11/2018 1711 05/11/2018 2017 Full Code [id number]  Delana Meyer, Dolores Lory, MD Inpatient   11/01/2017 0345 11/03/2017 1655 Full Code [id number]  Lance Coon, MD Inpatient   [facility] Care Planning Activity     Family Communication: Tried reaching the patient's husband on the phone but his mailbox was full. Disposition Plan: Status is: Inpatient  Dispo:  Patient From: Home  Planned Disposition: Home  Expected discharge date: 07/01/20  Medically stable for discharge: Today is  the first day that she started eating we will watch today and potential dispo tomorrow if continues to eat.  Antibiotics:  IV Diflucan  Time spent: 27 minutes  [place]

## 2020-07-01 LAB — BASIC METABOLIC PANEL
Anion gap: 12 (ref 5–15)
BUN: 52 mg/dL — ABNORMAL HIGH (ref 8–23)
CO2: 23 mmol/L (ref 22–32)
Calcium: 7.2 mg/dL — ABNORMAL LOW (ref 8.9–10.3)
Chloride: 100 mmol/L (ref 98–111)
Creatinine, Ser: 11.25 mg/dL — ABNORMAL HIGH (ref 0.44–1.00)
GFR, Estimated: 3 mL/min — ABNORMAL LOW (ref >60)
Glucose, Bld: 159 mg/dL — ABNORMAL HIGH (ref 70–99)
Potassium: 2.7 mmol/L — CL (ref 3.5–5.1)
Sodium: 135 mmol/L (ref 135–145)

## 2020-07-01 LAB — HEMOGLOBIN: Hemoglobin: 8 g/dL — ABNORMAL LOW (ref 12.0–15.0)

## 2020-07-01 LAB — MAGNESIUM: Magnesium: 1.7 mg/dL (ref 1.7–2.4)

## 2020-07-01 LAB — POTASSIUM: Potassium: 3.5 mmol/L (ref 3.5–5.1)

## 2020-07-01 MED ORDER — NEPRO/CARBSTEADY PO LIQD: 237.0000 mL | Freq: Two times a day (BID) | ORAL | 0 refills | Status: DC

## 2020-07-01 MED ORDER — FLUCONAZOLE 100 MG PO TABS: 100.0000 mg | ORAL_TABLET | Freq: Every day | ORAL | 0 refills | Status: DC

## 2020-07-01 MED ORDER — RENA-VITE PO TABS
1.0000 | ORAL_TABLET | Freq: Every day | ORAL | 0 refills | Status: DC
Start: 2020-07-02 — End: 2022-03-27

## 2020-07-01 MED ORDER — FLUCONAZOLE 100 MG PO TABS
100.0000 mg | ORAL_TABLET | Freq: Every day | ORAL | Status: DC
  Administered 2020-07-01: 100 mg via ORAL
  Filled 2020-07-01: qty 1

## 2020-07-01 MED ORDER — CLONIDINE 0.1 MG/24HR TD PTWK: 0.1000 mg | MEDICATED_PATCH | TRANSDERMAL | 12 refills | Status: DC

## 2020-07-01 MED ORDER — POTASSIUM CHLORIDE 10 MEQ/100ML IV SOLN
10.0000 meq | INTRAVENOUS | Status: AC
  Administered 2020-07-01 (×4): 10 meq via INTRAVENOUS
  Filled 2020-07-01 (×4): qty 100

## 2020-07-01 MED ORDER — MAGNESIUM SULFATE 2 GM/50ML IV SOLN
2.0000 g | Freq: Once | INTRAVENOUS | Status: AC
  Administered 2020-07-01: 2 g via INTRAVENOUS
  Filled 2020-07-01: qty 50

## 2020-07-01 MED ORDER — POTASSIUM CHLORIDE CRYS ER 20 MEQ PO TBCR
40.0000 meq | EXTENDED_RELEASE_TABLET | Freq: Once | ORAL | Status: AC
  Administered 2020-07-01: 40 meq via ORAL
  Filled 2020-07-01: qty 2

## 2020-07-01 NOTE — Progress Notes (Signed)
PT Cancellation Note  Patient Details Name: Tracy Jennings MRN: [id number] DOB: Nov 12, 1955   Cancelled Treatment:    Reason Eval/Treat Not Completed: Medical issues which prohibited therapy (Chart reviewed, RN consulted. K: 2.7 this date, outside of appropriate range for PT. Will continue to follow, resume services once appropriate.)   10:09 AM, 07/01/20 Etta Grandchild, PT, DPT Physical Therapist - [facility]  [phone] ([place])    [place] C 07/01/2020, 10:09 AM

## 2020-07-01 NOTE — TOC Progression Note (Addendum)
Transition of Care [facility]) - Progression Note    Patient Details  Name: Tracy Jennings MRN: [id number] Date of Birth: 1956/07/28  Transition of Care [facility]) CM/SW [place], LCSW Phone Number: 07/01/2020, 12:23 PM  Clinical Narrative:  [facility] representative call patient to let her know her copay per visit would be around $75. Patient told him she would have to discuss with her husband. [facility] representative tried calling him but no answer. CSW calling around to see if anyone else can accept and if so, see what the copay would be. [facility], [facility], and [facility] unable to accept. Left message for [facility] representative. [facility] is checking to see if they can accept what copay may be.  3:26 pm: [facility] said copay is likely the same for them. [facility] representative has not received a call back from husband yet. TOC team member tried calling him as well. Voicemail is full.  4:23 pm: CSW met with patient and her husband at bedside and discussed copay for home health. They would like to return home without home health services at this time. If they change their mind once they return home, they will reach out to patient's PCP. RN and MD are aware. RN will send some wound care supplies home with patient. Patient aware she can get more supplies at a pharmacy.   Expected Discharge Plan: [place] Barriers to Discharge: Continued Medical Work up  Expected Discharge Plan and Services Expected Discharge Plan: [place] arrangements for the past 2 months: [place]: PT [place]: [place] ([place]) Date [place]: 06/28/20   Representative spoke with at [place]: [place] ([place]) Interventions    Readmission Risk Interventions Readmission Risk Prevention Plan  06/28/2020 06/28/2020  Transportation Screening - Complete  Medication Review Press photographer) - Complete  PCP or Specialist appointment within 3-5 days of discharge - Complete  HRI or [place] - Complete  Palliative Care Screening Complete Not [place] Not Applicable Not Applicable  Some recent data might be hidden

## 2020-07-01 NOTE — Discharge Instructions (Signed)
Continue peritoneal dialysis as before and follow-up PD clinic with Dr. Candiss Norse

## 2020-07-01 NOTE — Progress Notes (Signed)
PHARMACY CONSULT NOTE - FOLLOW UP  Pharmacy Consult for Electrolyte Monitoring and Replacement   Recent Labs: Potassium (mmol/L)  Date Value  07/01/2020 2.7 (LL)  06/14/2014 3.6   Magnesium (mg/dL)  Date Value  07/01/2020 1.7  12/10/2013 1.8   Calcium (mg/dL)  Date Value  07/01/2020 7.2 (L)   Calcium, Total (mg/dL)  Date Value  06/14/2014 8.4 (L)   Albumin (g/dL)  Date Value  06/29/2020 1.8 (L)  06/12/2014 2.9 (L)   Phosphorus (mg/dL)  Date Value  06/30/2020 4.7 (H)   Sodium (mmol/L)  Date Value  07/01/2020 135  06/14/2014 139   Corrected Ca: 8.96 mg/dL  Assessment: 64 y.o. female with medical history significant for end-stage renal disease on peritoneal dialysis, history of cervical cancer (and has completed chemo and radiation therapy), patient is scheduled to follow-up at [facility] for brachytherapy due to persistent disease and hypertension who presents to the emergency room for 1 week history of nausea, vomiting and diarrhea and generalized weakness.  She has had refractory hypokalemia over several days  Goal of Therapy:  Electrolytes WNL  Plan:   2 grams IV magnesium sulfate x 1  10 mEq IV KCl x 4  40 mEq oral KCl x 1  Re-check potassium 1 hour after repletion   Dallie Piles ,PharmD Clinical Pharmacist 07/01/2020 9:10 AM

## 2020-07-01 NOTE — Progress Notes (Signed)
[facility]  ROUNDING NOTE   Subjective:   [name] was admitted to [facility] on 06/21/2020 for Hypocalcemia [E83.51] Hypokalemia [E87.6] Hypomagnesemia [E83.42] Chronic abdominal pain [R10.9, G89.29] Intractable vomiting [R11.10] Generalized weakness [R53.1] ESRD on peritoneal dialysis ([place]) [N18.6, Z99.2] Intractable nausea and vomiting [R11.2] Refractory nausea and vomiting [R11.2] Intractable diarrhea [R19.7]  Patient received peritoneal dialysis last night, tolerated well. Her appetite and oral intake are improving. GI symptoms are better Edema also appears to be better. Possible discharge today  Objective:  Vital signs in last 24 hours:  Temp:  [98.2 F (36.8 C)-98.6 F (37 C)] 98.6 F (37 C) (10/20 1302) Pulse Rate:  [79-85] 81 (10/20 1302) Resp:  [15-16] 15 (10/20 1302) BP: (120-180)/(63-81) 161/77 (10/20 1302) SpO2:  [100 %] 100 % (10/20 1302)  Weight change:  Filed Weights   06/27/20 0500 06/29/20 0500 06/30/20 0426  Weight: 66.6 kg 68.4 kg 66.4 kg    Intake/Output: I/O last 3 completed shifts: In: 882 [I.V.:132; IV Piggyback:750] Out: -    Intake/Output this shift:  No intake/output data recorded.  Physical Exam: General:  In no acute distress  Head:  oral mucosal membranes appears moist  Eyes:  Anicteric  Neck: Supple  Lungs:   Lungs clear bilaterally, respirations even and unlabored  Heart:  regular rate and rhythm   Abdomen:  Soft,non distended, diffuse tenderness +  Extremities:  1+ Upper and lower extremity edema  Neurologic:  Awake,alert, oriented x3  Skin: No acute lesions or rashes  Access:  Peritoneal catheter    Basic Metabolic Panel: Recent Labs  Lab 06/27/20 0503 06/27/20 0503 06/29/20 0528 06/30/20 0928 07/01/20 0755  NA 134*  --  135 134* 135  K 3.6  --  2.9* 2.9* 2.7*  CL 103  --  101 101 100  CO2 19*  --  21* 20* 23  GLUCOSE 97  --  99 83 159*  BUN 78*  --  61* 59* 52*  CREATININE 15.03*  --   12.95* 12.27* 11.25*  CALCIUM 6.9*   < > 6.8* 7.1* 7.2*  MG  --   --  1.5* 1.8 1.7  PHOS  --   --   --  4.7*  --    < > = values in this interval not displayed.    Liver Function Tests: Recent Labs  Lab 06/27/20 0503 06/29/20 0528  AST 11* 14*  ALT <5 <5  ALKPHOS 37* 35*  BILITOT 0.6 0.6  PROT 4.9* 5.0*  ALBUMIN 1.9* 1.8*   No results for input(s): LIPASE, AMYLASE in the last 168 hours. No results for input(s): AMMONIA in the last 168 hours.  CBC: Recent Labs  Lab 06/27/20 0503 06/29/20 0528 07/01/20 0755  WBC 5.6 4.9  --   HGB 8.5* 8.9* 8.0*  HCT 24.9* 25.8*  --   MCV 92.2 91.8  --   PLT 137* 127*  --     Cardiac Enzymes: No results for input(s): CKTOTAL, CKMB, CKMBINDEX, TROPONINI in the last 168 hours.  BNP: Invalid input(s): POCBNP  CBG: Recent Labs  Lab 06/24/20 1635 06/25/20 1139 06/25/20 1609  GLUCAP 74 71 64*    Microbiology: Results for orders placed or performed during the hospital encounter of 06/21/20  Respiratory Panel by RT PCR (Flu A&B, Covid) - Nasopharyngeal Swab     Status: None   Collection Time: 06/21/20 12:05 PM   Specimen: Nasopharyngeal Swab  Result Value Ref Range Status   SARS Coronavirus 2  by RT PCR NEGATIVE NEGATIVE Final    Comment: (NOTE) SARS-CoV-2 target nucleic acids are NOT DETECTED.  The SARS-CoV-2 RNA is generally detectable in upper respiratoy specimens during the acute phase of infection. The lowest concentration of SARS-CoV-2 viral copies this assay can detect is 131 copies/mL. A negative result does not preclude SARS-Cov-2 infection and should not be used as the sole basis for treatment or other patient management decisions. A negative result may occur with  improper specimen collection/handling, submission of specimen other than nasopharyngeal swab, presence of viral mutation(s) within the areas targeted by this assay, and inadequate number of viral copies (<131 copies/mL). A negative result must be combined  with clinical observations, patient history, and epidemiological information. The expected result is Negative.  Fact Sheet for Patients:  PinkCheek.be  Fact Sheet for Healthcare Providers:  GravelBags.it  This test is no t yet approved or cleared by the [place] FDA and  has been authorized for detection and/or diagnosis of SARS-CoV-2 by FDA under an Emergency Use Authorization (EUA). This EUA will remain  in effect (meaning this test can be used) for the duration of the COVID-19 declaration under Section 564(b)(1) of the Act, 21 U.S.C. section 360bbb-3(b)(1), unless the authorization is terminated or revoked sooner.     Influenza A by PCR NEGATIVE NEGATIVE Final   Influenza B by PCR NEGATIVE NEGATIVE Final    Comment: (NOTE) The Xpert Xpress SARS-CoV-2/FLU/RSV assay is intended as an aid in  the diagnosis of influenza from Nasopharyngeal swab specimens and  should not be used as a sole basis for treatment. Nasal washings and  aspirates are unacceptable for Xpert Xpress SARS-CoV-2/FLU/RSV  testing.  Fact Sheet for Patients: PinkCheek.be  Fact Sheet for Healthcare Providers: GravelBags.it  This test is not yet approved or cleared by the [place] FDA and  has been authorized for detection and/or diagnosis of SARS-CoV-2 by  FDA under an Emergency Use Authorization (EUA). This EUA will remain  in effect (meaning this test can be used) for the duration of the  Covid-19 declaration under Section 564(b)(1) of the Act, 21  U.S.C. section 360bbb-3(b)(1), unless the authorization is  terminated or revoked. Performed at [facility], [address] [place], [place] [address]   C Difficile Quick Screen w PCR reflex     Status: None   Collection Time: 06/22/20 12:35 PM   Specimen: STOOL  Result Value Ref Range Status   C Diff antigen NEGATIVE  NEGATIVE Final   C Diff toxin NEGATIVE NEGATIVE Final   C Diff interpretation No C. difficile detected.  Final    Comment: Performed at [facility], [address] [place], [place] [address]  Gastrointestinal Panel by PCR , Stool     Status: None   Collection Time: 06/26/20  9:17 AM   Specimen: Stool  Result Value Ref Range Status   Campylobacter species NOT DETECTED NOT DETECTED Final   Plesimonas shigelloides NOT DETECTED NOT DETECTED Final   Salmonella species NOT DETECTED NOT DETECTED Final   Yersinia enterocolitica NOT DETECTED NOT DETECTED Final   Vibrio species NOT DETECTED NOT DETECTED Final   Vibrio cholerae NOT DETECTED NOT DETECTED Final   Enteroaggregative E coli (EAEC) NOT DETECTED NOT DETECTED Final   Enteropathogenic E coli (EPEC) NOT DETECTED NOT DETECTED Final   Enterotoxigenic E coli (ETEC) NOT DETECTED NOT DETECTED Final   Shiga like toxin producing E coli (STEC) NOT DETECTED NOT DETECTED Final   Shigella/Enteroinvasive E coli (EIEC) NOT  DETECTED NOT DETECTED Final   Cryptosporidium NOT DETECTED NOT DETECTED Final   Cyclospora cayetanensis NOT DETECTED NOT DETECTED Final   Entamoeba histolytica NOT DETECTED NOT DETECTED Final   Giardia lamblia NOT DETECTED NOT DETECTED Final   Adenovirus F40/41 NOT DETECTED NOT DETECTED Final   Astrovirus NOT DETECTED NOT DETECTED Final   Norovirus GI/GII NOT DETECTED NOT DETECTED Final   Rotavirus A NOT DETECTED NOT DETECTED Final   Sapovirus (I, II, IV, and V) NOT DETECTED NOT DETECTED Final    Comment: Performed at [facility], [address] [place], [place] [address]  Giardia, EIA; Ova/Parasite     Status: None (Preliminary result)   Collection Time: 06/26/20  9:17 AM   Specimen: Stool  Result Value Ref Range Status   Ova + Parasite Exam PENDING  Incomplete   Giardia Ag, Stl Negative Negative Final    Comment: (NOTE) Performed At: [facility] [place], [place]  [id number] Rush Farmer MD EH:[id number]     Coagulation Studies: No results for input(s): LABPROT, INR in the last 72 hours.  Urinalysis: No results for input(s): COLORURINE, LABSPEC, PHURINE, GLUCOSEU, HGBUR, BILIRUBINUR, KETONESUR, PROTEINUR, UROBILINOGEN, NITRITE, LEUKOCYTESUR in the last 72 hours.  Invalid input(s): APPERANCEUR    Imaging: No results found.   Medications:   . sodium chloride 250 mL (07/01/20 0953)  . dialysis solution 2.5% low-MG/low-CA     . vitamin C  250 mg Oral BID  . calcitRIOL  0.5 mcg Oral Daily  . calcium acetate  1,334 mg Oral BID WC  . calcium carbonate  1 tablet Oral TID  . carvedilol  25 mg Oral BID WC  . Chlorhexidine Gluconate Cloth  6 each Topical Daily  . cloNIDine  0.1 mg Transdermal Weekly  . diltiazem  180 mg Oral Daily  . epoetin (EPOGEN/PROCRIT) injection  10,000 Units Subcutaneous Weekly  . feeding supplement (NEPRO CARB STEADY)  237 mL Oral BID BM  . fluconazole  100 mg Oral Daily  . gentamicin cream  1 application Topical Daily  . miconazole   Topical BID  . multivitamin  1 tablet Oral QAC breakfast  . pantoprazole (PROTONIX) IV  40 mg Intravenous Q12H   sodium chloride, acetaminophen **OR** acetaminophen, diphenhydrAMINE, diphenoxylate-atropine, hydrALAZINE, labetalol, LORazepam, ondansetron **OR** ondansetron (ZOFRAN) IV, promethazine  Assessment/ Plan:  [name] is a 64 y.o. black female with end stage renal disease on peritoneal dialysis, cervical cancer, hypertension, diabetes mellitus type II, peripheral vascular disease, hyperlipidemia, who is admitted to [facility] on 06/21/2020 for Hypocalcemia [E83.51] Hypokalemia [E87.6] Hypomagnesemia [E83.42] Chronic abdominal pain [R10.9, G89.29] Intractable vomiting [R11.10] Generalized weakness [R53.1] ESRD on peritoneal dialysis ([place]) [N18.6, Z99.2] Intractable nausea and vomiting [R11.2] Refractory nausea and vomiting [R11.2] Intractable diarrhea [R19.7]  [facility]  Peritoneal Dialysis [facility] 66kg CCPD 9 hours, 4 exchanges, 25103mL fills with last fill of 2020mL with icodextran.   # End Stage Renal Disease on peritoneal dialysis.  #Generalized edema Patient tolerating peritoneal dialysis sessions We will continue PD treatments Recommended to patient to use 4.25% and 2.5% bags at home  #  Hypertension:  Home regimen of carvedilol and diltiazem.  Blood pressure readings getting better, still stays above the goal -On Clonidine patch and carvedilol PO -Continue PRN antihypertensives hydralazine and labetalol  # Hypokalemia: with GI losses. On oral potassium supplementation at home.  -Potassium stays low 2.9 - IV KCL for replacement  # Hyponatremia: secondary to GI losses and kidney failure. _ Sodium 134 -  We will continue close monitoring  # Anemia with chronic kidney disease:  Required PRBC transfusion during this admission Hemoglobin 8.9 on 06/29/2020 Will continue Epogen 10,000 units subcu weekly  #Secondary Hyperparathyroidism with hypocalcemia: Calcitriol  Dose increased starting today Continue PhosLo Calcium better, 7.1  Today Phosphorus  4.7   # Generalized weakness PT notes reviewed   LOS: [place] 10/20/20214:10 PM

## 2020-07-01 NOTE — Discharge Summary (Signed)
[facility] [place] NAME: Tracy Jennings    MR#:  [id number]  DATE OF BIRTH:  1956/03/03  DATE OF ADMISSION:  06/21/2020 ADMITTING PHYSICIAN: Collier Bullock, MD  DATE OF DISCHARGE: 07/01/2020  PRIMARY CARE PHYSICIAN: Sharyne Peach, MD    ADMISSION DIAGNOSIS:  Hypocalcemia [E83.51] Hypokalemia [E87.6] Hypomagnesemia [E83.42] Chronic abdominal pain [R10.9, G89.29] Intractable vomiting [R11.10] Generalized weakness [R53.1] ESRD on peritoneal dialysis ([place]) [N18.6, Z99.2] Intractable nausea and vomiting [R11.2] Refractory nausea and vomiting [R11.2] Intractable diarrhea [R19.7]  DISCHARGE DIAGNOSIS:  nausea vomiting diarrhea with poor PO intake-- improving oral thrush/suspected esophagitis failure to thrive uncontrolled hypertension electrolyte abnormality history of cervical cancer ongoing chemotherapy SECONDARY DIAGNOSIS:   Past Medical History:  Diagnosis Date  . Anemia 04/2018   low iron. to be started on supplements  . Cervical cancer ([place])   . CKD (chronic kidney disease)    Stage IV  . Complication of anesthesia    receceived too much anesthesia, that she was in coma for a couple days   . COVID-19 virus detected 10/28/2019  . Diabetes mellitus without complication ([place])    type II  . ESRD (end stage renal disease) ([place])   . Heart murmur    followed as a child only  . HSIL (high grade squamous intraepithelial lesion) on Pap smear of cervix   . Hyperlipidemia associated with type 2 diabetes mellitus ([place])   . Hypertension   . Pancreatitis   . Peripheral vascular disease [facility])     HOSPITAL COURSE:  Tracy Jennings is a 64 y.o. female with medical history significant for end-stage renal disease on peritoneal dialysis, history of cervical cancer (and has completed chemo and radiation therapy), patient is scheduled to follow-up at [facility] for brachytherapy due to persistent disease and hypertension who presents to the  emergency room for 1 week history of nausea, vomiting and diarrhea and generalized weakness.  Patient states she has been unable to tolerate any oral intake for over a week  #Nausea, vomiting, diarrhea and abdominal pain.  Patient started eating more.   -tolerating soft diet -  Continue Nepro drinks. -  Patient completed empiric 5 days of Maxipime and Flagyl just in case SBP/colitis causing her symptoms.   -Empiric Diflucan just in case thrush esophagitis causing symptoms.  Could be secondary to recent chemotherapy-- will give total of two weeks  #Thrush on IV Diflucan-- change to oral Diflucan for total two weeks  Would give a longer course of Diflucan upon going home just to cover thrush esophagitis.  Patient did refuse endoscopy during this hospital stay.  #Failure to thrive and poor oral intake. Eating much better. No nausea vomiting or diarrhea.   #Essential hypertension.  Continue clonidine patch, Coreg and diltiazem  #End-stage renal disease.  Continue peritoneal dialysis as per nephrology  #Hypokalemia give another 4 rounds of potassium today.K 3.5.  -Continue oral potassium at home  #Hypomagnesemia.  Magnesium low normal.  Would rather not give oral magnesium secondary to this potentially causing diarrhea. Received magnesium  #Stage II sacral decubiti, present on admission as per wound care nurse.  See description below  #Anemia of chronic disease.  Continue to monitor hemoglobin.   #Cervical cancer.  Follow-up with outpatient oncology for ongoing chemotherapy. Patient has a implanted port.  Pressure Injury 06/26/20 Sacrum Mid Stage 2 -  Partial thickness loss of dermis presenting as a shallow open injury with a red, pink wound bed without slough. open pink  area (Active)  06/26/20 1900  Location: Sacrum  Location Orientation: Mid  Staging: Stage 2 -  Partial thickness loss of dermis presenting as a shallow open injury with a red, pink wound bed without slough.  Wound  Description (Comments): open pink area  Present on Admission:    TOC for discharge planning. Per TOC patient declined home health services due to cost. Patient's husband's voicemail is full was not able to leave a message.  Charge planning discussed with patient she is in agreement to go home today CONSULTS OBTAINED:    DRUG ALLERGIES:   Allergies  Allergen Reactions  . Amlodipine Swelling    Knees down to ankles Knees down to ankles  . Hctz [Hydrochlorothiazide]     pancreatitis  . Heparin Other (See Comments)    Pt reports cardiac arrest when given heparin  . Lmw Heparin     Reports cardiac arrest when given heparin  . Other Other (See Comments)    Reports cardiac arrest when given during surgery Reports cardiac arrest when given during surgery Reports cardiac arrest when given during surgery   . Sulfa Antibiotics Rash  . Dairy Aid [Lactase]     Runny nose  . Hydralazine Nausea Only    DISCHARGE MEDICATIONS:   Allergies as of 07/01/2020      Reactions   Amlodipine Swelling   Knees down to ankles Knees down to ankles   Hctz [hydrochlorothiazide]    pancreatitis   Heparin Other (See Comments)   Pt reports cardiac arrest when given heparin   Lmw Heparin    Reports cardiac arrest when given heparin   Other Other (See Comments)   Reports cardiac arrest when given during surgery Reports cardiac arrest when given during surgery Reports cardiac arrest when given during surgery   Sulfa Antibiotics Rash   Dairy Aid [lactase]    Runny nose   Hydralazine Nausea Only      Medication List    STOP taking these medications   loperamide 2 MG tablet Commonly known as: IMODIUM A-D     TAKE these medications   calcitRIOL 0.25 MCG capsule Commonly known as: ROCALTROL Take 0.25 mcg by mouth daily.   calcium acetate 667 MG capsule Commonly known as: PHOSLO Take 1,334 mg by mouth 2 (two) times daily with a meal.   carvedilol 25 MG tablet Commonly known as:  COREG Take 25 mg by mouth 2 (two) times daily with a meal.   cloNIDine 0.1 mg/24hr patch Commonly known as: CATAPRES - Dosed in mg/24 hr Place 1 patch (0.1 mg total) onto the skin once a week.   Dilt-XR 180 MG 24 hr capsule Generic drug: diltiazem Take 180 mg by mouth daily. What changed: Another medication with the same name was removed. Continue taking this medication, and follow the directions you see here.   diphenoxylate-atropine 2.5-0.025 MG tablet Commonly known as: LOMOTIL Take 1 tablet by mouth 4 (four) times daily as needed for diarrhea or loose stools.   feeding supplement (NEPRO CARB STEADY) Liqd Take 237 mLs by mouth 2 (two) times daily between meals. Start taking on: July 02, 2020   fluconazole 100 MG tablet Commonly known as: DIFLUCAN Take 1 tablet (100 mg total) by mouth daily. Start taking on: July 02, 2020   lidocaine-prilocaine cream Commonly known as: EMLA Apply 1 application topically as needed. Apply to port 1-2 hours prior to chemotherapy, cover with plastic wrap.   multivitamin Tabs tablet Take 1 tablet by mouth daily before  breakfast. Start taking on: July 02, 2020   ondansetron 4 MG tablet Commonly known as: ZOFRAN Take 4 mg by mouth 2 (two) times daily.   Plavix 75 MG tablet Generic drug: clopidogrel Take 75 mg by mouth daily.   Potassium Chloride ER 20 MEQ Tbcr Take 1 tablet by mouth daily.   prochlorperazine 10 MG tablet Commonly known as: COMPAZINE Take 1 tablet (10 mg total) by mouth every 6 (six) hours as needed (Nausea or vomiting).            Discharge Care Instructions  (From admission, onward)         Start     Ordered   07/01/20 0000  Discharge wound care:       Comments: Dressing procedure/placement/frequency: Currently a silicone foam dressing is in place. I will add covering the lesion with an antimicrobial nonadherent wound contact layer (xeroform) prior to placement of the silicone foam.  Turning from side  to side is in place. I will provide a pressure redistribution chair pad for her use during periods of extended sitting.   07/01/20 1705          If you experience worsening of your admission symptoms, develop shortness of breath, life threatening emergency, suicidal or homicidal thoughts you must seek medical attention immediately by calling 911 or calling your MD immediately  if symptoms less severe.  You Must read complete instructions/literature along with all the possible adverse reactions/side effects for all the Medicines you take and that have been prescribed to you. Take any new Medicines after you have completely understood and accept all the possible adverse reactions/side effects.   Please note  You were cared for by a hospitalist during your hospital stay. If you have any questions about your discharge medications or the care you received while you were in the hospital after you are discharged, you can call the unit and asked to speak with the hospitalist on call if the hospitalist that took care of you is not available. Once you are discharged, your primary care physician will handle any further medical issues. Please note that NO REFILLS for any discharge medications will be authorized once you are discharged, as it is imperative that you return to your primary care physician (or establish a relationship with a primary care physician if you do not have one) for your aftercare needs so that they can reassess your need for medications and monitor your lab values. Today   SUBJECTIVE   No new complaints. Tolerating PO diet well.  VITAL SIGNS:  Blood pressure (!) 161/76, pulse 84, temperature 98.4 F (36.9 C), resp. rate 18, height 5\' 3"  (1.6 m), weight 66.4 kg, SpO2 100 %.  I/O:    Intake/Output Summary (Last 24 hours) at 07/01/2020 1706 Last data filed at 07/01/2020 0428 Gross per 24 hour  Intake 271.39 ml  Output --  Net 271.39 ml    PHYSICAL EXAMINATION:  GENERAL:  64  y.o.-year-old patient lying in the bed with no acute distress. Appears chronically ill   LUNGS: Normal breath sounds bilaterally, no wheezing, rales,rhonchi or crepitation. No use of accessory muscles of respiration. Port + CARDIOVASCULAR: S1, S2 normal. No murmurs, rubs, or gallops.  ABDOMEN: Soft, non-tender, non-distended. Bowel sounds present. No organomegaly or mass.  EXTREMITIES: No pedal edema, cyanosis, or clubbing.  NEUROLOGIC: Cranial nerves II through XII are intact. Muscle strength 5/5 in all extremities. Sensation intact. Gait not checked. weak PSYCHIATRIC: The patient is alert and oriented x 3.  SKIN: No obvious rash, lesion, or ulcer.   DATA REVIEW:   CBC  Recent Labs  Lab 06/29/20 0528 06/29/20 0528 07/01/20 0755  WBC 4.9  --   --   HGB 8.9*   < > 8.0*  HCT 25.8*  --   --   PLT 127*  --   --    < > = values in this interval not displayed.    Chemistries  Recent Labs  Lab 06/29/20 0528 06/30/20 0928 07/01/20 0755 07/01/20 0755 07/01/20 1559  NA 135   < > 135  --   --   K 2.9*   < > 2.7*   < > 3.5  CL 101   < > 100  --   --   CO2 21*   < > 23  --   --   GLUCOSE 99   < > 159*  --   --   BUN 61*   < > 52*  --   --   CREATININE 12.95*   < > 11.25*  --   --   CALCIUM 6.8*   < > 7.2*  --   --   MG 1.5*   < > 1.7  --   --   AST 14*  --   --   --   --   ALT <5  --   --   --   --   ALKPHOS 35*  --   --   --   --   BILITOT 0.6  --   --   --   --    < > = values in this interval not displayed.    Microbiology Results   Recent Results (from the past 240 hour(s))  C Difficile Quick Screen w PCR reflex     Status: None   Collection Time: 06/22/20 12:35 PM   Specimen: STOOL  Result Value Ref Range Status   C Diff antigen NEGATIVE NEGATIVE Final   C Diff toxin NEGATIVE NEGATIVE Final   C Diff interpretation No C. difficile detected.  Final    Comment: Performed at [facility], [address] [place], [place] [address]  Gastrointestinal Panel by  PCR , Stool     Status: None   Collection Time: 06/26/20  9:17 AM   Specimen: Stool  Result Value Ref Range Status   Campylobacter species NOT DETECTED NOT DETECTED Final   Plesimonas shigelloides NOT DETECTED NOT DETECTED Final   Salmonella species NOT DETECTED NOT DETECTED Final   Yersinia enterocolitica NOT DETECTED NOT DETECTED Final   Vibrio species NOT DETECTED NOT DETECTED Final   Vibrio cholerae NOT DETECTED NOT DETECTED Final   Enteroaggregative E coli (EAEC) NOT DETECTED NOT DETECTED Final   Enteropathogenic E coli (EPEC) NOT DETECTED NOT DETECTED Final   Enterotoxigenic E coli (ETEC) NOT DETECTED NOT DETECTED Final   Shiga like toxin producing E coli (STEC) NOT DETECTED NOT DETECTED Final   Shigella/Enteroinvasive E coli (EIEC) NOT DETECTED NOT DETECTED Final   Cryptosporidium NOT DETECTED NOT DETECTED Final   Cyclospora cayetanensis NOT DETECTED NOT DETECTED Final   Entamoeba histolytica NOT DETECTED NOT DETECTED Final   Giardia lamblia NOT DETECTED NOT DETECTED Final   Adenovirus F40/41 NOT DETECTED NOT DETECTED Final   Astrovirus NOT DETECTED NOT DETECTED Final   Norovirus GI/GII NOT DETECTED NOT DETECTED Final   Rotavirus A NOT DETECTED NOT DETECTED Final   Sapovirus (I, II, IV, and V) NOT DETECTED NOT DETECTED  Final    Comment: Performed at [facility], [address] [place], [place] [address]  Giardia, EIA; Ova/Parasite     Status: None (Preliminary result)   Collection Time: 06/26/20  9:17 AM   Specimen: Stool  Result Value Ref Range Status   Ova + Parasite Exam PENDING  Incomplete   Giardia Ag, Stl Negative Negative Final    Comment: (NOTE) Performed At: [facility] [place], [place] [id number] Rush Farmer MD YQ:[id number]     RADIOLOGY:  No results found.   CODE STATUS:     Code Status Orders  (From admission, onward)         Start     Ordered   06/21/20 0818  Full code  Continuous        06/21/20 0819         Code Status History    Date Active Date Inactive Code Status Order ID Comments User Context   04/08/2020 0959 04/08/2020 1627 Full Code [id number]  Mellody Drown, MD Inpatient   11/03/2019 2104 11/09/2019 2032 Full Code [id number]  Athena Masse, MD ED   10/29/2019 0243 11/01/2019 2028 Full Code [id number]  Mansy, Arvella Merles, MD ED   05/11/2018 2017 05/25/2018 2211 Full Code [id number]  Erlene Quan, NP Inpatient   05/11/2018 1711 05/11/2018 2017 Full Code [id number]  Delana Meyer Dolores Lory, MD Inpatient   11/01/2017 0345 11/03/2017 1655 Full Code [id number]  Lance Coon, MD Inpatient   Advance Care Planning Activity       TOTAL TIME TAKING CARE OF THIS PATIENT: *35* minutes.    Fritzi Mandes M.D  [facility]    CC: Primary care physician; Sharyne Peach, MD

## 2020-07-01 NOTE — Progress Notes (Addendum)
   07/01/20 1100  Clinical Encounter Type  Visited With Patient  Visit Type Initial  Referral From Chaplain  Consult/Referral To Chaplain  Chaplain stopped in to check on page as she sat on side of bed. Chaplain asked if she could pray with her and she said yes. Chaplain prayed with patient and left.

## 2020-07-01 NOTE — Progress Notes (Signed)
[facility]  ROUNDING NOTE   Subjective:   Ms. GENELDA ROARK was admitted to [facility] on 06/21/2020 for Hypocalcemia [E83.51] Hypokalemia [E87.6] Hypomagnesemia [E83.42] Chronic abdominal pain [R10.9, G89.29] Intractable vomiting [R11.10] Generalized weakness [R53.1] ESRD on peritoneal dialysis ([facility]) [N18.6, Z99.2] Intractable nausea and vomiting [R11.2] Refractory nausea and vomiting [R11.2] Intractable diarrhea [R19.7]  Patient improving clinically.Oral intake progressing,denies nausea and vomiting today.She is sitting up with a rolling walker, expressed plans to ambulate in the hallway.  Objective:  Vital signs in last 24 hours:  Temp:  [98.2 F (36.8 C)-98.6 F (37 C)] 98.6 F (37 C) (10/20 1302) Pulse Rate:  [79-85] 81 (10/20 1302) Resp:  [15-16] 15 (10/20 1302) BP: (120-180)/(63-81) 161/77 (10/20 1302) SpO2:  [100 %] 100 % (10/20 1302)  Weight change:  Filed Weights   06/27/20 0500 06/29/20 0500 06/30/20 0426  Weight: 66.6 kg 68.4 kg 66.4 kg    Intake/Output: I/O last 3 completed shifts: In: 882 [I.V.:132; IV Piggyback:750] Out: -    Intake/Output this shift:  No intake/output data recorded.  Physical Exam: General:  Sitting up in bed, appears pleasant   Head:  Normocephalic, atraumatic, oral mucosal membranesmoist  Eyes:  Anicteric  Neck: Supple  Lungs:   Lungs clear bilaterally, respirations even and unlabored  Heart:  regular rate and rhythm   Abdomen:  Soft,non distended, diffuse tenderness +  Extremities:  1+ Upper and lower extremity edema  Neurologic:  Awake,alert, oriented x3  Skin: No acute lesions or rashes  Access:  Peritoneal catheter    Basic Metabolic Panel: Recent Labs  Lab 06/27/20 0503 06/27/20 0503 06/29/20 0528 06/30/20 0928 07/01/20 0755  NA 134*  --  135 134* 135  K 3.6  --  2.9* 2.9* 2.7*  CL 103  --  101 101 100  CO2 19*  --  21* 20* 23  GLUCOSE 97  --  99 83 159*  BUN 78*  --  61* 59* 52*  CREATININE  15.03*  --  12.95* 12.27* 11.25*  CALCIUM 6.9*   < > 6.8* 7.1* 7.2*  MG  --   --  1.5* 1.8 1.7  PHOS  --   --   --  4.7*  --    < > = values in this interval not displayed.    Liver Function Tests: Recent Labs  Lab 06/27/20 0503 06/29/20 0528  AST 11* 14*  ALT <5 <5  ALKPHOS 37* 35*  BILITOT 0.6 0.6  PROT 4.9* 5.0*  ALBUMIN 1.9* 1.8*   No results for input(s): LIPASE, AMYLASE in the last 168 hours. No results for input(s): AMMONIA in the last 168 hours.  CBC: Recent Labs  Lab 06/27/20 0503 06/29/20 0528 07/01/20 0755  WBC 5.6 4.9  --   HGB 8.5* 8.9* 8.0*  HCT 24.9* 25.8*  --   MCV 92.2 91.8  --   PLT 137* 127*  --     Cardiac Enzymes: No results for input(s): CKTOTAL, CKMB, CKMBINDEX, TROPONINI in the last 168 hours.  BNP: Invalid input(s): POCBNP  CBG: Recent Labs  Lab 06/24/20 1635 06/25/20 1139 06/25/20 1609  GLUCAP 74 71 64*    Microbiology: Results for orders placed or performed during the hospital encounter of 06/21/20  Respiratory Panel by RT PCR (Flu A&B, Covid) - Nasopharyngeal Swab     Status: None   Collection Time: 06/21/20 12:05 PM   Specimen: Nasopharyngeal Swab  Result Value Ref Range Status   SARS Coronavirus 2 by RT  PCR NEGATIVE NEGATIVE Final    Comment: (NOTE) SARS-CoV-2 target nucleic acids are NOT DETECTED.  The SARS-CoV-2 RNA is generally detectable in upper respiratoy specimens during the acute phase of infection. The lowest concentration of SARS-CoV-2 viral copies this assay can detect is 131 copies/mL. A negative result does not preclude SARS-Cov-2 infection and should not be used as the sole basis for treatment or other patient management decisions. A negative result may occur with  improper specimen collection/handling, submission of specimen other than nasopharyngeal swab, presence of viral mutation(s) within the areas targeted by this assay, and inadequate number of viral copies (<131 copies/mL). A negative result must  be combined with clinical observations, patient history, and epidemiological information. The expected result is Negative.  Fact Sheet for Patients:  PinkCheek.be  Fact Sheet for Healthcare Providers:  GravelBags.it  This test is no t yet approved or cleared by the [place] FDA and  has been authorized for detection and/or diagnosis of SARS-CoV-2 by FDA under an Emergency Use Authorization (EUA). This EUA will remain  in effect (meaning this test can be used) for the duration of the COVID-19 declaration under Section 564(b)(1) of the Act, 21 U.S.C. section 360bbb-3(b)(1), unless the authorization is terminated or revoked sooner.     Influenza A by PCR NEGATIVE NEGATIVE Final   Influenza B by PCR NEGATIVE NEGATIVE Final    Comment: (NOTE) The Xpert Xpress SARS-CoV-2/FLU/RSV assay is intended as an aid in  the diagnosis of influenza from Nasopharyngeal swab specimens and  should not be used as a sole basis for treatment. Nasal washings and  aspirates are unacceptable for Xpert Xpress SARS-CoV-2/FLU/RSV  testing.  Fact Sheet for Patients: PinkCheek.be  Fact Sheet for Healthcare Providers: GravelBags.it  This test is not yet approved or cleared by the [place] FDA and  has been authorized for detection and/or diagnosis of SARS-CoV-2 by  FDA under an Emergency Use Authorization (EUA). This EUA will remain  in effect (meaning this test can be used) for the duration of the  Covid-19 declaration under Section 564(b)(1) of the Act, 21  U.S.C. section 360bbb-3(b)(1), unless the authorization is  terminated or revoked. Performed at [facility], [address] [place], [place] [address]   C Difficile Quick Screen w PCR reflex     Status: None   Collection Time: 06/22/20 12:35 PM   Specimen: STOOL  Result Value Ref Range Status   C Diff antigen  NEGATIVE NEGATIVE Final   C Diff toxin NEGATIVE NEGATIVE Final   C Diff interpretation No C. difficile detected.  Final    Comment: Performed at [facility], [address] [place], [place] [address]  Gastrointestinal Panel by PCR , Stool     Status: None   Collection Time: 06/26/20  9:17 AM   Specimen: Stool  Result Value Ref Range Status   Campylobacter species NOT DETECTED NOT DETECTED Final   Plesimonas shigelloides NOT DETECTED NOT DETECTED Final   Salmonella species NOT DETECTED NOT DETECTED Final   Yersinia enterocolitica NOT DETECTED NOT DETECTED Final   Vibrio species NOT DETECTED NOT DETECTED Final   Vibrio cholerae NOT DETECTED NOT DETECTED Final   Enteroaggregative E coli (EAEC) NOT DETECTED NOT DETECTED Final   Enteropathogenic E coli (EPEC) NOT DETECTED NOT DETECTED Final   Enterotoxigenic E coli (ETEC) NOT DETECTED NOT DETECTED Final   Shiga like toxin producing E coli (STEC) NOT DETECTED NOT DETECTED Final   Shigella/Enteroinvasive E coli (EIEC) NOT DETECTED NOT  DETECTED Final   Cryptosporidium NOT DETECTED NOT DETECTED Final   Cyclospora cayetanensis NOT DETECTED NOT DETECTED Final   Entamoeba histolytica NOT DETECTED NOT DETECTED Final   Giardia lamblia NOT DETECTED NOT DETECTED Final   Adenovirus F40/41 NOT DETECTED NOT DETECTED Final   Astrovirus NOT DETECTED NOT DETECTED Final   Norovirus GI/GII NOT DETECTED NOT DETECTED Final   Rotavirus A NOT DETECTED NOT DETECTED Final   Sapovirus (I, II, IV, and V) NOT DETECTED NOT DETECTED Final    Comment: Performed at [facility], [address] [place], [place] [address]  Giardia, EIA; Ova/Parasite     Status: None (Preliminary result)   Collection Time: 06/26/20  9:17 AM   Specimen: Stool  Result Value Ref Range Status   Ova + Parasite Exam PENDING  Incomplete   Giardia Ag, Stl Negative Negative Final    Comment: (NOTE) Performed At: [facility] [place], [place]  [id number] Rush Farmer MD NT:[id number]     Coagulation Studies: No results for input(s): LABPROT, INR in the last 72 hours.  Urinalysis: No results for input(s): COLORURINE, LABSPEC, PHURINE, GLUCOSEU, HGBUR, BILIRUBINUR, KETONESUR, PROTEINUR, UROBILINOGEN, NITRITE, LEUKOCYTESUR in the last 72 hours.  Invalid input(s): APPERANCEUR    Imaging: No results found.   Medications:   . sodium chloride 250 mL (07/01/20 0953)  . dialysis solution 2.5% low-MG/low-CA     . vitamin C  250 mg Oral BID  . calcitRIOL  0.5 mcg Oral Daily  . calcium acetate  1,334 mg Oral BID WC  . calcium carbonate  1 tablet Oral TID  . carvedilol  25 mg Oral BID WC  . Chlorhexidine Gluconate Cloth  6 each Topical Daily  . cloNIDine  0.1 mg Transdermal Weekly  . diltiazem  180 mg Oral Daily  . epoetin (EPOGEN/PROCRIT) injection  10,000 Units Subcutaneous Weekly  . feeding supplement (NEPRO CARB STEADY)  237 mL Oral BID BM  . fluconazole  100 mg Oral Daily  . gentamicin cream  1 application Topical Daily  . miconazole   Topical BID  . multivitamin  1 tablet Oral QAC breakfast  . pantoprazole (PROTONIX) IV  40 mg Intravenous Q12H   sodium chloride, acetaminophen **OR** acetaminophen, diphenhydrAMINE, diphenoxylate-atropine, hydrALAZINE, labetalol, LORazepam, ondansetron **OR** ondansetron (ZOFRAN) IV, promethazine  Assessment/ Plan:  Ms. KMARI BRIAN is a 64 y.o. black female with end stage renal disease on peritoneal dialysis, cervical cancer, hypertension, diabetes mellitus type II, peripheral vascular disease, hyperlipidemia, who is admitted to [facility] on 06/21/2020 for Hypocalcemia [E83.51] Hypokalemia [E87.6] Hypomagnesemia [E83.42] Chronic abdominal pain [R10.9, G89.29] Intractable vomiting [R11.10] Generalized weakness [R53.1] ESRD on peritoneal dialysis ([facility]) [N18.6, Z99.2] Intractable nausea and vomiting [R11.2] Refractory nausea and vomiting [R11.2] Intractable diarrhea [R19.7]  [facility]  Peritoneal Dialysis [facility] 66kg CCPD 9 hours, 4 exchanges, 2582mL fills with last fill of 2041mL with icodextran.   # End Stage Renal Disease on peritoneal dialysis.  Continues to be on peritoneal dialysis Tolerating treatments well  #  Hypertension:  Home regimen of carvedilol and diltiazem.  Blood pressure readings in acceptable range -On Clonidine patch and carvedilol PO -Continue PRN antihypertensives hydralazine and labetalol  # Hypokalemia: with GI losses. On oral potassium supplementation at home.  -Potassium stays low 2.7 - IV KCL for replacement  # Hyponatremia: secondary to GI losses and kidney failure. _ Sodium normal, 134 -We will continue close monitoring  # Anemia with chronic kidney disease:  Required PRBC transfusion during this admission  Hemoglobin 8.0 Will continue Epogen 10,000 units subcu weekly  #Secondary Hyperparathyroidism with hypocalcemia: Calcitriol  Dose increased starting today Continue PhosLo Calcium better, 7.2  Today Phosphorus  4.7    LOS: 10 Keisy Strickler 10/20/20214:14 PM

## 2020-07-02 LAB — GIARDIA, EIA; OVA/PARASITE: Giardia Ag, Stl: NEGATIVE

## 2020-07-02 LAB — O&P RESULT

## 2020-07-06 DIAGNOSIS — Z87898 Personal history of other specified conditions: Secondary | ICD-10-CM | POA: Insufficient documentation

## 2020-07-13 ENCOUNTER — Telehealth (INDEPENDENT_AMBULATORY_CARE_PROVIDER_SITE_OTHER): Payer: Self-pay

## 2020-07-13 NOTE — Telephone Encounter (Signed)
Butch Penny from [facility] called ad left a VM on the nurses line wanting to know should the pt continue Plavix long term. The pt had a peripheral vascular cath placed on 04/11/2019. Please advise.

## 2020-07-13 NOTE — Telephone Encounter (Signed)
I called Tracy Jennings at [facility] an left a VM making her aware of the Np's instructions.

## 2020-07-13 NOTE — Telephone Encounter (Signed)
She can stop from a vascular standpoint

## 2020-07-14 ENCOUNTER — Ambulatory Visit
Admission: RE | Admit: 2020-07-14 | Discharge: 2020-07-14 | Disposition: A | Discharge status: Home / Self Care | Payer: BC Managed Care – PPO | Attending: Vascular Surgery | Admitting: Vascular Surgery

## 2020-07-14 ENCOUNTER — Other Ambulatory Visit
Admission: RE | Admit: 2020-07-14 | Discharge: 2020-07-14 | Disposition: A | Discharge status: Home / Self Care | Payer: BC Managed Care – PPO | Source: Ambulatory Visit | Attending: Vascular Surgery | Admitting: Vascular Surgery

## 2020-07-14 ENCOUNTER — Other Ambulatory Visit (INDEPENDENT_AMBULATORY_CARE_PROVIDER_SITE_OTHER): Payer: Self-pay | Admitting: Nurse Practitioner

## 2020-07-14 ENCOUNTER — Other Ambulatory Visit: Payer: Self-pay

## 2020-07-14 ENCOUNTER — Encounter
Admission: RE | Disposition: A | Discharge status: Home / Self Care | Payer: Self-pay | Source: Home / Self Care | Attending: Vascular Surgery

## 2020-07-14 DIAGNOSIS — E1122 Type 2 diabetes mellitus with diabetic chronic kidney disease: Secondary | ICD-10-CM | POA: Diagnosis not present

## 2020-07-14 DIAGNOSIS — N186 End stage renal disease: Secondary | ICD-10-CM | POA: Insufficient documentation

## 2020-07-14 DIAGNOSIS — I120 Hypertensive chronic kidney disease with stage 5 chronic kidney disease or end stage renal disease: Secondary | ICD-10-CM | POA: Insufficient documentation

## 2020-07-14 DIAGNOSIS — N185 Chronic kidney disease, stage 5: Secondary | ICD-10-CM | POA: Diagnosis not present

## 2020-07-14 DIAGNOSIS — Z992 Dependence on renal dialysis: Secondary | ICD-10-CM | POA: Diagnosis not present

## 2020-07-14 DIAGNOSIS — Z888 Allergy status to other drugs, medicaments and biological substances status: Secondary | ICD-10-CM | POA: Diagnosis not present

## 2020-07-14 DIAGNOSIS — Z20822 Contact with and (suspected) exposure to covid-19: Secondary | ICD-10-CM | POA: Insufficient documentation

## 2020-07-14 DIAGNOSIS — Z882 Allergy status to sulfonamides status: Secondary | ICD-10-CM | POA: Diagnosis not present

## 2020-07-14 HISTORY — PX: DIALYSIS/PERMA CATHETER INSERTION: CATH118288

## 2020-07-14 LAB — SARS CORONAVIRUS 2 BY RT PCR (HOSPITAL ORDER, PERFORMED IN ~~LOC~~ HOSPITAL LAB): SARS Coronavirus 2: NEGATIVE

## 2020-07-14 LAB — POTASSIUM (ARMC VASCULAR LAB ONLY): Potassium (ARMC vascular lab): 4.5 (ref 3.5–5.1)

## 2020-07-14 SURGERY — DIALYSIS/PERMA CATHETER INSERTION
Anesthesia: Moderate Sedation

## 2020-07-14 MED ORDER — CEFAZOLIN SODIUM-DEXTROSE 1-4 GM/50ML-% IV SOLN
INTRAVENOUS | Status: AC
  Administered 2020-07-14: 1 g via INTRAVENOUS
  Filled 2020-07-14: qty 50

## 2020-07-14 MED ORDER — ONDANSETRON 4 MG PO TBDP
4.0000 mg | ORAL_TABLET | Freq: Once | ORAL | Status: AC
  Administered 2020-07-14: 4 mg via ORAL

## 2020-07-14 MED ORDER — MIDAZOLAM HCL 2 MG/ML PO SYRP: 8.0000 mg | ORAL_SOLUTION | Freq: Once | ORAL | Status: DC | PRN

## 2020-07-14 MED ORDER — SODIUM CHLORIDE 0.9 % IV SOLN: INTRAVENOUS | Status: DC

## 2020-07-14 MED ORDER — ONDANSETRON 4 MG PO TBDP
ORAL_TABLET | ORAL | Status: AC
  Filled 2020-07-14: qty 1

## 2020-07-14 MED ORDER — HYDROMORPHONE HCL 1 MG/ML IJ SOLN: 1.0000 mg | Freq: Once | INTRAMUSCULAR | Status: DC | PRN

## 2020-07-14 MED ORDER — CEFAZOLIN SODIUM-DEXTROSE 1-4 GM/50ML-% IV SOLN: 1.0000 g | Freq: Once | INTRAVENOUS | Status: AC

## 2020-07-14 MED ORDER — FENTANYL CITRATE (PF) 100 MCG/2ML IJ SOLN
INTRAMUSCULAR | Status: DC | PRN
  Administered 2020-07-14: 25 ug via INTRAVENOUS
  Administered 2020-07-14: 50 ug via INTRAVENOUS

## 2020-07-14 MED ORDER — MIDAZOLAM HCL 2 MG/2ML IJ SOLN
INTRAMUSCULAR | Status: AC
  Filled 2020-07-14: qty 2

## 2020-07-14 MED ORDER — FAMOTIDINE 20 MG PO TABS: 40.0000 mg | ORAL_TABLET | Freq: Once | ORAL | Status: DC | PRN

## 2020-07-14 MED ORDER — ALTEPLASE 2 MG IJ SOLR
INTRAMUSCULAR | Status: AC
  Filled 2020-07-14: qty 4

## 2020-07-14 MED ORDER — FENTANYL CITRATE (PF) 100 MCG/2ML IJ SOLN
INTRAMUSCULAR | Status: AC
  Filled 2020-07-14: qty 2

## 2020-07-14 MED ORDER — DIPHENHYDRAMINE HCL 50 MG/ML IJ SOLN: 50.0000 mg | Freq: Once | INTRAMUSCULAR | Status: DC | PRN

## 2020-07-14 MED ORDER — ONDANSETRON HCL 4 MG/2ML IJ SOLN: 4.0000 mg | Freq: Four times a day (QID) | INTRAMUSCULAR | Status: DC | PRN

## 2020-07-14 MED ORDER — METHYLPREDNISOLONE SODIUM SUCC 125 MG IJ SOLR: 125.0000 mg | Freq: Once | INTRAMUSCULAR | Status: DC | PRN

## 2020-07-14 MED ORDER — MIDAZOLAM HCL 2 MG/2ML IJ SOLN
INTRAMUSCULAR | Status: DC | PRN
  Administered 2020-07-14 (×2): 1 mg via INTRAVENOUS

## 2020-07-14 SURGICAL SUPPLY — 3 items
CATH CANNON HEMO 15FR 23CM (HEMODIALYSIS SUPPLIES) ×2 IMPLANT
PACK ANGIOGRAPHY (CUSTOM PROCEDURE TRAY) ×2 IMPLANT
TOWEL OR 17X26 4PK STRL BLUE (TOWEL DISPOSABLE) ×2 IMPLANT

## 2020-07-14 NOTE — Op Note (Addendum)
OPERATIVE NOTE    PRE-OPERATIVE DIAGNOSIS: 1. ESRD   POST-OPERATIVE DIAGNOSIS: same as above  PROCEDURE: 1. Ultrasound guidance for vascular access to the left internal jugular vein 2. Fluoroscopic guidance for placement of catheter 3. Placement of a 23 cm tip to cuff tunneled hemodialysis catheter via the left internal jugular vein  SURGEON: Leotis Pain, MD  ANESTHESIA:  Local with Moderate conscious sedation for approximately 30 minutes using 2 mg of Versed and 75 mcg of Fentanyl  ESTIMATED BLOOD LOSS: 5 cc  FLUORO TIME: less than one minute  CONTRAST: none  FINDING(S): 1.  Patent left internal jugular vein  SPECIMEN(S):  None  INDICATIONS:   Tracy Jennings is a 64 y.o. female who presents with renal failure and needs to transition back to HD from PD.  The patient needs long term dialysis access for their ESRD, and a Permcath is necessary.  Risks and benefits are discussed and informed consent is obtained.    DESCRIPTION: After obtaining full informed written consent, the patient was brought back to the vascular suited. The patient's left neck and chest were sterilely prepped and draped in a sterile surgical field was created. Moderate conscious sedation was administered during a face to face encounter with the patient throughout the procedure with my supervision of the RN administering medicines and monitoring the patient's vital signs, pulse oximetry, telemetry and mental status throughout from the start of the procedure until the patient was taken to the recovery room.  The left internal jugular vein was visualized with ultrasound and found to be patent. It was then accessed under direct ultrasound guidance and a permanent image was recorded. A wire was placed. After skin nick and dilatation, the peel-away sheath was placed over the wire. I then turned my attention to an area under the clavicle. Approximately 1-2 fingerbreadths below the clavicle a small counterincision was  created and tunneled from the subclavicular incision to the access site. Using fluoroscopic guidance, a 23 cm centimeter tip to cuff tunneled hemodialysis catheter was selected, and tunneled from the subclavicular incision to the access site. It was then placed through the peel-away sheath and the peel-away sheath was removed. Using fluoroscopic guidance the catheter tips were parked in the right atrium. The appropriate distal connectors were placed. It withdrew blood well and flushed easily with saline and a concentrated tpa solution was then placed for a dwell due to her reported heparin allergy. It was secured to the chest wall with 2 Prolene sutures. The access incision was closed single 4-0 Monocryl. A 4-0 Monocryl pursestring suture was placed around the exit site. Sterile dressings were placed. The patient tolerated the procedure well and was taken to the recovery room in stable condition.  COMPLICATIONS: None  CONDITION: Stable  Leotis Pain  07/14/2020, 4:40 PM   This note was created with Dragon Medical transcription system. Any errors in dictation are purely unintentional.

## 2020-07-14 NOTE — Progress Notes (Signed)
Per patient, "no nausea, I feel much better" Zofran ODT effective. Patient verbalizes understanding of discharge instructions.

## 2020-07-14 NOTE — Discharge Instructions (Signed)
Please call [facility] in am for follow up appointment in 2-3 weeks, tell them you need vein mapping

## 2020-07-14 NOTE — Progress Notes (Signed)
Right chest wall portacath de-accessed per protocol by Fransico Michael RN BSN. Patient tolerated procedure without event.  Sandwich provided/juice, tolerated

## 2020-07-14 NOTE — Progress Notes (Signed)
Patient states "I ate to fast"   Small emesis, vague c/o's nausea.  Adminstered 4mg  zofran ODT. Will monitor effects.

## 2020-07-14 NOTE — H&P (Signed)
[facility] Admission History & Physical  MRN : [id number]  Tracy Jennings is a 64 y.o. (Nov 24, 1955) female who presents with chief complaint of scheduled PermCath insertion.  History of Present Illness:  The patient is a 64 year old female with multiple medical issues including known end-stage renal disease currently maintained by peritoneal dialysis.  Patient reports issues with her dialysis catheter not allowing her to complete full dialysis sessions.  Patient presents for a scheduled perm cath placement to allow her to dialyze.  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  Current Facility-Administered Medications  Medication Dose Route Frequency Provider Last Rate Last Admin  . 0.9 %  sodium chloride infusion   Intravenous Continuous Eulogio Ditch E, NP      . ceFAZolin (ANCEF) IVPB 1 g/50 mL premix  1 g Intravenous Once Eulogio Ditch E, NP 100 mL/hr at 07/14/20 1606 1 g at 07/14/20 1606  . diphenhydrAMINE (BENADRYL) injection 50 mg  50 mg Intravenous Once PRN Kris Hartmann, NP      . famotidine (PEPCID) tablet 40 mg  40 mg Oral Once PRN Kris Hartmann, NP      . fentaNYL (SUBLIMAZE) 100 MCG/2ML injection           . fentaNYL (SUBLIMAZE) injection    PRN Algernon Huxley, MD   50 mcg at 07/14/20 1606  . HYDROmorphone (DILAUDID) injection 1 mg  1 mg Intravenous Once PRN Eulogio Ditch E, NP      . methylPREDNISolone sodium succinate (SOLU-MEDROL) 125 mg/2 mL injection 125 mg  125 mg Intravenous Once PRN Kris Hartmann, NP      . midazolam (VERSED) 2 MG/2ML injection           . midazolam (VERSED) 2 MG/ML syrup 8 mg  8 mg Oral Once PRN Kris Hartmann, NP      . midazolam (VERSED) injection    PRN Algernon Huxley, MD   1 mg at 07/14/20 1606  . ondansetron (ZOFRAN) injection 4 mg  4 mg Intravenous Q6H PRN Kris Hartmann, NP       Past Medical History:  Diagnosis Date  . Anemia 04/2018   low iron. to be started on supplements  . Cervical cancer ([place])   . CKD  (chronic kidney disease)    Stage IV  . Complication of anesthesia    receceived too much anesthesia, that she was in coma for a couple days   . COVID-19 virus detected 10/28/2019  . Diabetes mellitus without complication ([place])    type II  . ESRD (end stage renal disease) ([place])   . Heart murmur    followed as a child only  . HSIL (high grade squamous intraepithelial lesion) on Pap smear of cervix   . Hyperlipidemia associated with type 2 diabetes mellitus ([place])   . Hypertension   . Pancreatitis   . Peripheral vascular disease [facility])    Past Surgical History:  Procedure Laterality Date  . AMPUTATION TOE Left 2013   2nd toe. tip of toe (toe nail was infected)  . AV FISTULA PLACEMENT Left 05/11/2018   Procedure: ARTERIOVENOUS (AV) FISTULA CREATION;  Surgeon: Katha Cabal, MD;  Location: [facility] ORS;  Service: Vascular;  Laterality: Left;  . CATARACT EXTRACTION    . CERVICAL CONIZATION W/BX N/A 04/08/2020   Procedure: CONIZATION CERVIX WITH BIOPSY;  Surgeon: Mellody Drown, MD;  Location: [facility] ORS;  Service: Gynecology;  Laterality: N/A;  . CHOLECYSTECTOMY  2014  .  COLONOSCOPY    . COLONOSCOPY WITH PROPOFOL N/A 01/10/2018   Procedure: COLONOSCOPY WITH PROPOFOL;  Surgeon: Toledo, Benay Pike, MD;  Location: [facility] ENDOSCOPY;  Service: Gastroenterology;  Laterality: N/A;  . DIALYSIS/PERMA CATHETER INSERTION N/A 05/21/2018   Procedure: DIALYSIS/PERMA CATHETER INSERTION;  Surgeon: Algernon Huxley, MD;  Location: [place] [facility];  Service: Cardiovascular;  Laterality: N/A;  . DIALYSIS/PERMA CATHETER REMOVAL N/A 04/11/2019   Procedure: DIALYSIS/PERMA CATHETER REMOVAL;  Surgeon: Algernon Huxley, MD;  Location: [place] [facility];  Service: Cardiovascular;  Laterality: N/A;  . EYE SURGERY Bilateral 2018   cataract extractions  . IR IMAGING GUIDED PORT INSERTION  06/05/2020  . THROMBECTOMY W/ EMBOLECTOMY  05/11/2018   Procedure: THROMBECTOMY ARTERIOVENOUS FISTULA;  Surgeon: Katha Cabal, MD;   Location: [facility] ORS;  Service: Vascular;;  . TUBAL LIGATION  1984   Social History Social History   Tobacco Use  . Smoking status: Never Smoker  . Smokeless tobacco: Never Used  Vaping Use  . Vaping Use: Never used  Substance Use Topics  . Alcohol use: No  . Drug use: Never   Family History Family History  Problem Relation Age of Onset  . Stroke Mother   . Hypertension Mother   . Gout Mother   . Cancer Father   . Diabetes Sister   . Diabetes Maternal Grandmother   . Diabetes Maternal Grandfather   . Diabetes Paternal Grandmother   . Diabetes Paternal Grandfather   No family history of bleeding or clotting disorders, autoimmune disease or porphyria.  Allergies  Allergen Reactions  . Amlodipine Swelling    Knees down to ankles Knees down to ankles  . Hctz [Hydrochlorothiazide]     pancreatitis  . Heparin Other (See Comments)    Pt reports cardiac arrest when given heparin  . Lmw Heparin     Reports cardiac arrest when given heparin  . Other Other (See Comments)    Reports cardiac arrest when given during surgery Reports cardiac arrest when given during surgery Reports cardiac arrest when given during surgery   . Sulfa Antibiotics Rash  . D[employer] [Lactase]     Runny nose  . Hydralazine Nausea Only   REVIEW OF SYSTEMS (Negative unless checked)  Constitutional: [] Weight loss  [] Fever  [] Chills Cardiac: [] Chest pain   [] Chest pressure   [] Palpitations   [] Shortness of breath when laying flat   [] Shortness of breath at rest   [x] Shortness of breath with exertion. Vascular:  [] Pain in legs with walking   [] Pain in legs at rest   [] Pain in legs when laying flat   [] Claudication   [] Pain in feet when walking  [] Pain in feet at rest  [] Pain in feet when laying flat   [] History of DVT   [] Phlebitis   [x] Swelling in legs   [] Varicose veins   [] Non-healing ulcers Pulmonary:   [] Uses home oxygen   [] Productive cough   [] Hemoptysis   [] Wheeze  [] COPD   [] Asthma Neurologic:   [] Dizziness  [] Blackouts   [] Seizures   [] History of stroke   [] History of TIA  [] Aphasia   [] Temporary blindness   [] Dysphagia   [] Weakness or numbness in arms   [] Weakness or numbness in legs Musculoskeletal:  [] Arthritis   [] Joint swelling   [] Joint pain   [] Low back pain Hematologic:  [] Easy bruising  [] Easy bleeding   [] Hypercoagulable state   [] Anemic  [] Hepatitis Gastrointestinal:  [] Blood in stool   [] Vomiting blood  [] Gastroesophageal reflux/heartburn   [] Difficulty  swallowing. Genitourinary:  [x] Chronic kidney disease   [] Difficult urination  [] Frequent urination  [] Burning with urination   [] Blood in urine Skin:  [] Rashes   [] Ulcers   [] Wounds Psychological:  [] History of anxiety   []  History of major depression.  Physical Examination  Vitals:   07/14/20 1606 07/14/20 1611  BP: (!) 154/95 136/87  Resp: 14 11  SpO2: 99% 100%   There is no height or weight on file to calculate BMI. Gen: WD/WN, NAD Head: [place]/AT, No temporalis wasting. Prominent temp pulse not noted. Ear/Nose/Throat: Hearing grossly intact, nares w/o erythema or drainage, oropharynx w/o Erythema/Exudate,  Eyes: Conjunctiva clear, sclera non-icteric Neck: Trachea midline.  No JVD.  Pulmonary:  Good air movement, respirations not labored, no use of accessory muscles.  Cardiac: RRR, normal S1, S2. Vascular: Preserved Vessel Right Left  Radial Palpable Palpable  Ulnar Not Palpable Not Palpable  Brachial Palpable Palpable  Carotid Palpable, without bruit Palpable, without bruit  Gastrointestinal: soft, non-tender/non-distended. No guarding/reflex.  Musculoskeletal: M/S 5/5 throughout.  Extremities without ischemic changes.  No deformity or atrophy.  Neurologic: Sensation grossly intact in extremities.  Symmetrical.  Speech is fluent. Motor exam as listed above. Psychiatric: Judgment intact, Mood & affect appropriate for pt's clinical situation. Dermatologic: No rashes or ulcers noted.  No cellulitis or open  wounds. Lymph : No Cervical, Axillary, or Inguinal lymphadenopathy.  CBC Lab Results  Component Value Date   WBC 4.9 06/29/2020   HGB 8.0 (L) 07/01/2020   HCT 25.8 (L) 06/29/2020   MCV 91.8 06/29/2020   PLT 127 (L) 06/29/2020   BMET    Component Value Date/Time   NA 135 07/01/2020 0755   NA 139 06/14/2014 0543   K 3.5 07/01/2020 1559   K 3.6 06/14/2014 0543   CL 100 07/01/2020 0755   CL 105 06/14/2014 0543   CO2 23 07/01/2020 0755   CO2 29 06/14/2014 0543   GLUCOSE 159 (H) 07/01/2020 0755   GLUCOSE 152 (H) 06/14/2014 0543   BUN 52 (H) 07/01/2020 0755   BUN 21 (H) 06/14/2014 0543   CREATININE 11.25 (H) 07/01/2020 0755   CREATININE 1.04 06/14/2014 0543   CALCIUM 7.2 (L) 07/01/2020 0755   CALCIUM 8.4 (L) 06/14/2014 0543   GFRNONAA 3 (L) 07/01/2020 0755   GFRNONAA 58 (L) 06/14/2014 0543   GFRNONAA >60 12/10/2013 0407   GFRAA 3 (L) 06/15/2020 0841   GFRAA >60 06/14/2014 0543   GFRAA >60 12/10/2013 0407   CrCl cannot be calculated (Unknown ideal weight.).  COAG Lab Results  Component Value Date   INR 1.1 11/03/2019   INR 1.04 05/12/2018   INR 1.07 05/11/2018   Radiology CT ABDOMEN PELVIS WO CONTRAST  Result Date: 06/21/2020 CLINICAL DATA:  Pain. Diarrhea for 1 month. End-stage renal disease with peritoneal dialysis. Cervical cancer undergoing chemotherapy and radiation therapy. EXAM: CT ABDOMEN AND PELVIS WITHOUT CONTRAST TECHNIQUE: Multidetector CT imaging of the abdomen and pelvis was performed following the standard protocol without IV contrast. COMPARISON:  PET 03/13/2020 FINDINGS: Lower chest: Clear lung bases. Normal heart size without pericardial or pleural effusion. Lad coronary artery calcification. Hepatobiliary: Normal liver. Cholecystectomy, without biliary ductal dilatation. Pancreas: Normal, without mass or ductal dilatation. Spleen: Normal in size, new hypoattenuation within the superior anterior spleen on 15/2. Adrenals/Urinary Tract: Left greater than  right adrenal enlargement. The right adrenal is somewhat nodular but maintains its adreniform shape, similar. The left adrenal measures maximally 2.6 x 1.7 cm and low-density, favoring an adenoma. Bilateral renal vascular  calcifications. No hydronephrosis. Decompressed urinary bladder. Subtle pericystic edema. Stomach/Bowel: Normal stomach, without wall thickening. The colon is relatively diffusely decompressed, but thick walled. Most apparent within the transverse and ascending segments on 41/2. Normal terminal ileum. Appendix favored to be identified on 58/2, without evidence of appendicitis. Normal small bowel. Vascular/Lymphatic: Advanced aortic and branch vessel atherosclerosis. No abdominopelvic adenopathy. Reproductive: Normal uterus. No adnexal mass. Suspect a tampon in place. Other: Decrease in trace abdominopelvic fluid. Peritoneal dialysis catheter terminating in the right hemipelvis. No free intraperitoneal air. Diffuse anasarca. Musculoskeletal: Lumbosacral spondylosis with trace L4-5 anterolisthesis. IMPRESSION: 1. Relatively decompressed, but thick walled colon, consistent with colitis. Favor infection. 2. Decrease in trace abdominopelvic fluid with peritoneal dialysis catheter in place. 3. New hypoattenuation within the superior anterior spleen, suspicious for infarct. 4. Subtle pericystic edema, possibly radiation induced. Recommend clinical exclusion of infectious cystitis. At 5. Coronary artery atherosclerosis. Aortic Atherosclerosis (ICD10-I70.0). 6. Left adrenal nodule, favored to represent an adenoma. Electronically Signed   By: Abigail Miyamoto M.D.   On: 06/21/2020 08:54   Assessment/Plan The patient is a 64 year old female with multiple medical issues including known end-stage renal disease currently maintained by peritoneal dialysis. Patient reports issues with her dialysis catheter not allowing her to complete full dialysis sessions.   1.  End-stage renal disease requiring hemodialysis:    The patient has been maintained by a peritoneal dialysis catheter.  Unfortunately, this has been giving her issues and she is not able to finish full runs.  Also experienced some nausea during treatments.  Patient presents today to undergo a PermCath insertion to allow her to switch to hemodialysis.  Procedure, risks benefits were explained to the patient.  All questions were answered.  Patient wished to proceed.  2.  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 Avoid use of heparin.  3. Diabetes mellitus:  Glucose will be monitored and oral medications been held this morning once the patient has undergone the patient's procedure po intake will be reinitiated and again Accu-Cheks will be used to assess the blood glucose level and treat as needed. The patient will be restarted on the patient's usual hypoglycemic regime  Discussed with Dr. Mayme Genta, PA-C  07/14/2020 4:18 PM

## 2020-07-16 ENCOUNTER — Encounter: Payer: Self-pay | Admitting: Vascular Surgery

## 2020-07-29 ENCOUNTER — Ambulatory Visit: Admission: RE | Admit: 2020-07-29 | Payer: Medicare Other | Source: Ambulatory Visit | Admitting: Radiation Oncology

## 2020-07-29 DIAGNOSIS — C530 Malignant neoplasm of endocervix: Secondary | ICD-10-CM

## 2020-08-18 ENCOUNTER — Telehealth: Payer: Self-pay

## 2020-08-18 NOTE — Telephone Encounter (Signed)
Received message from Tracy Haver, NP with Dr. Christel Jennings. Case discussed at [facility] tumor board and they would like for Tracy Jennings to see Tracy Jennings in 5-6 weeks. Tracy Jennings voicemail is full. Called and spoke with Tracy Jennings and notified of appointment change. Appointment also mailed to home address.

## 2020-09-30 ENCOUNTER — Ambulatory Visit: Payer: Medicare Other | Attending: Radiation Oncology | Admitting: Radiation Oncology

## 2020-09-30 ENCOUNTER — Ambulatory Visit: Payer: Medicare Other

## 2020-10-06 ENCOUNTER — Ambulatory Visit: Payer: BC Managed Care – PPO | Admitting: Cardiovascular Disease

## 2020-10-06 ENCOUNTER — Emergency Department
Admission: EM | Admit: 2020-10-06 | Discharge: 2020-10-06 | Disposition: A | Discharge status: Home / Self Care | Payer: BC Managed Care – PPO | Attending: Emergency Medicine | Admitting: Emergency Medicine

## 2020-10-06 ENCOUNTER — Other Ambulatory Visit: Payer: Self-pay

## 2020-10-06 DIAGNOSIS — Z992 Dependence on renal dialysis: Secondary | ICD-10-CM | POA: Diagnosis not present

## 2020-10-06 DIAGNOSIS — R109 Unspecified abdominal pain: Secondary | ICD-10-CM | POA: Insufficient documentation

## 2020-10-06 DIAGNOSIS — Z5321 Procedure and treatment not carried out due to patient leaving prior to being seen by health care provider: Secondary | ICD-10-CM | POA: Insufficient documentation

## 2020-10-06 NOTE — ED Notes (Signed)
Pt sitting in lobby in w/c with no distress noted, accomp by husband; pt st that has had diarrhea recently and believes she "just had gas" when she came in; st now having no c/o at all and wants to go home; also reports that she did not take any of her BP meds today until arrival here and still has 1 more pill to take when she gets home; STRONGLY recommended pt wait to be evaluated by the provider and also informed that she has had no labwork performed and should really stay for such; pt cont to st she feels fine and wants to leave without further evaluation; pt & husband appear A&Ox3 and understand recommendation that she stay and be evaluated

## 2020-10-06 NOTE — ED Triage Notes (Signed)
Pt comes via POV from home with c/o abdominal pain. Pt states this started after dialysis. Pt states it is gas. Pt states BM today. Pt states belly is distended.

## 2020-10-06 NOTE — ED Notes (Signed)
Lab called to come and draw blood

## 2020-10-07 ENCOUNTER — Encounter: Payer: Self-pay | Admitting: Cardiovascular Disease

## 2020-10-14 ENCOUNTER — Inpatient Hospital Stay: Payer: BC Managed Care – PPO | Attending: Obstetrics and Gynecology | Admitting: Obstetrics and Gynecology

## 2020-10-14 VITALS — BP 215/88 | HR 70 | Temp 98.7°F | Resp 20 | Wt 150.0 lb

## 2020-10-14 DIAGNOSIS — E119 Type 2 diabetes mellitus without complications: Secondary | ICD-10-CM | POA: Insufficient documentation

## 2020-10-14 DIAGNOSIS — Z923 Personal history of irradiation: Secondary | ICD-10-CM | POA: Insufficient documentation

## 2020-10-14 DIAGNOSIS — C530 Malignant neoplasm of endocervix: Secondary | ICD-10-CM

## 2020-10-14 DIAGNOSIS — Z9221 Personal history of antineoplastic chemotherapy: Secondary | ICD-10-CM | POA: Diagnosis not present

## 2020-10-14 DIAGNOSIS — N186 End stage renal disease: Secondary | ICD-10-CM | POA: Insufficient documentation

## 2020-10-14 DIAGNOSIS — G901 Familial dysautonomia [Riley-Day]: Secondary | ICD-10-CM | POA: Diagnosis not present

## 2020-10-14 DIAGNOSIS — Z992 Dependence on renal dialysis: Secondary | ICD-10-CM | POA: Diagnosis not present

## 2020-10-14 DIAGNOSIS — R011 Cardiac murmur, unspecified: Secondary | ICD-10-CM | POA: Insufficient documentation

## 2020-10-14 DIAGNOSIS — Z79899 Other long term (current) drug therapy: Secondary | ICD-10-CM | POA: Insufficient documentation

## 2020-10-14 DIAGNOSIS — I129 Hypertensive chronic kidney disease with stage 1 through stage 4 chronic kidney disease, or unspecified chronic kidney disease: Secondary | ICD-10-CM | POA: Insufficient documentation

## 2020-10-14 DIAGNOSIS — Z7901 Long term (current) use of anticoagulants: Secondary | ICD-10-CM | POA: Insufficient documentation

## 2020-10-14 DIAGNOSIS — Z8616 Personal history of COVID-19: Secondary | ICD-10-CM | POA: Insufficient documentation

## 2020-10-14 DIAGNOSIS — I6529 Occlusion and stenosis of unspecified carotid artery: Secondary | ICD-10-CM | POA: Diagnosis not present

## 2020-10-14 NOTE — Progress Notes (Addendum)
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   Completed brachytherapy at [facility] 2 months ago and also got blood transfusion.  Treatment complicated by electrolyte abnormalities, diarrhea, nausea, anemia requiring multiple transfusions. Still having diarrhea, but just once or twice a day. Still somewhat weak and came in wheel chair.  Walks around at home.   History of ESRD, previously considered for kidney transplant, more recently note states, appears patient has 2-3 year wait following completion of treatment before she could consider her for listing. Case was closed and recommended she continue treatment with current dialysis unit, [facility].  She was on peritoneal dialysis, but now doing hemodialysis 3x per week. Her BP was low so they stopped one of her antihypertensive drugs.    Gynecology Oncology History PAP: Atypical squamous cells cannot exclude HSIL (ASC-H)  Seen by Dr Larey Days 03/10/20 On exam: Vagina normal, well-estrogenized vaginal mucosa, no evidence of vaginal neoplasia, Cervix: No overt carcinoma, well-visualized  Colposcopic exam: TZ not seen entirely, AWE not seen. No lesions seen.  ECC was  done, random cervical biopsies were taken Pathology 10:00 - suspicious for adenocarcinoma, 6:00 bx and ECC - adenocarcinoma  She was initially seen on 03/11/2020 in the [facility] and cervix looked and felt normal.  PET/CT to evaluate origin of the cancer (uterine vs. cervical origin) and assess for extent of disease.  PET scan 03/13/20 shows some low level FDG activity in the cervix (none in uterus), but no prominent cervical mass.  Some PET activity in pelvic nodes, but not above blood pool and no other primary or metastatic disease noted.  Initial plan was to proceed with chemoradiation for cervical cancer.  However, without any knowledge of the size or depth of invasion of the cancer in the cervix based on exam and imaging it was decided that a cone biopsy and D&C was needed to confirm that there is more than 3 mm of invasive cancer in the cervix and that there was no endometrial primary.  If cancer was confined to the cervix and invasion and < 19m invasion, this could be sufficiently treated with a large cone biopsy with negative margins, and it would be better to avoid the potential toxicity of chemoradiation in view of her existing vascular disease and dialysis.  If she is proven to have a cervical cancer with > 3 mm of invasion or LVSI would proceed with chemoradiation.   History significant for significant vascular disease with end stage renal disease, on peritoneal dialysis and carotid stenosis. [facility] for thrombosis prophylaxis. She had a thrombosis of her AVF fistula in 2019 after this was placed and had cardiac arrest post thrombectomy.  She was seen by cardiology on 03/30/2020 and  felt to be doing well from cardiac perspective and given clearance for surgery. Echo in 10/2019 revealed EF 60-65%. EKG 03/30/20 demonstrated NSR, 77 bpm, rare PVC, lVH, poor R wave progression along precordial leads, no acute st/t changes. Blood pressure is therapeutically  elevated due to history of orthostatic hypotension. She is felt to be low risk for noncardiac surgery. Vascular surgery recommended holding clopidogrel for 5 days prior to surgery and for 2 days post operatively. She continues peritoneal dialysis.   Cone biopsy 04/08/20  A. CERVIX, CONIZATION BIOPSY:  - INVASIVE ADENOCARCINOMA WITH INVOLVEMENT OF ENDOCERVICAL AND DEEP MARGINS.   B. ENDOCERVIX, CURETTAGE:  - SCANT DETACHED AND DEGENERATED CELLS, SUSPICIOUS FOR MALIGNANCY.   C. ENDOMETRIUM, CURETTAGE: - INVASIVE ADENOCARCINOMA.   Comment:  Sections demonstrate an infiltrative adenocarcinoma with areas of marked nuclear pleomorphism. This high-grade adenocarcinoma shows diffuse  positive staining for p16. Staining for p53 appears to demonstrate a null (mutated) pattern, which would rule out an HPV-associated endocervical adenocarcinoma and would favor the diagnosis of high-grade serous carcinoma. However, there is not an internal positive control, and therefore the stain cannot be interpreted as null with complete certainty. MMR stains were performed to possibly aid in diagnosing an endometrioid carcinoma, with no loss of MMR proteins.  The case has been sent for expert consultation and second opinion, with an addendum to follow.   ([facility] review below) A. 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 The ectocervical margin appears uninvolved. No lymphovascular invasion is identified. Chromogenic in situ hybridization for high risk HPV E6/7 mRNA (types 16, 18, 26, 31, 33, 35, 39, 45, 51, 52, 53, 56, 58, 59, 66, 68, 73 and 82) is negative in the cells of interest.  Immunohistochemistry was performed on blocks A2 and A5 at [facility] to characterize the pathologic process and demonstrates the following immunophenotype in the cells of interest: POSITIVE: HNF1B, AMACR, Napsin A, p16 (diffuse), Ki-67 elevated, NEGATIVE: ER, PR. This  staining profile supports the above diagnosis.  B. Endocervix, curettage: Scant detached atypical cells with degenerative changes, suspicious for adenocarcinoma.  C. Endometrium, curettage: Clear cell adenocarcinoma, present in a minute fragment of stroma, see COMMENT. COMMENT: The sections demonstrate an infiltrative glandular proliferation of malignant cells with marked nuclear pleomorphism, undermining endocervical and ectocervical epithelium at the squamocolumnar junction. The lesion measures at least 8 mm in lateral extent and invades 1 mm of cervical stroma, but extension to the endocervical and deep margins precludes definitive measurement. Although the bulk of the lesion appears in the cone biopsy, an endometrial primary cannot be excluded.  In view of pathology report and PET scan believe this is most likely a primary cervical clear cell adenocarcinoma and she started chemoradiation 05/11/20. Exam at that time showed Necrotic debris over cone bed and this was removed with swab.  Granulation tissue underneath.   05/21/20-06/08/20- 3 cycles cisplatin 05/13/20-10/7-  EBRT w/ Dr. Baruch Gouty at [facility] 06/21/20 admitted to [facility] for nausea/vomiting/diarrhea due to colotis from radiation 07/06/20-08/13/20- HDR w/ Dr. Christel Mormon at [place] scan 10/21 IMPRESSION: 1. Relatively decompressed, but thick walled colon, consistent with colitis. Favor infection. 2. Decrease in trace abdominopelvic fluid with peritoneal dialysis catheter in place. 3. New hypoattenuation within the superior anterior spleen, suspicious for infarct. 4. Subtle pericystic edema, possibly radiation induced. Recommend clinical exclusion of infectious cystitis.  5. Coronary artery atherosclerosis. Aortic Atherosclerosis 6. Left adrenal nodule, favored to represent an adenoma.  Medical history Menopause age 53.  No bleeding since.  No pelvic pain.   UTI a month ago and some vaginal discharge.   On peritoneal dialysis for about a year and  stopped working. Takes iron shots for anemia.  Patient hospitalized three days for mild COVID19 pneumonia in February 2021 2/21 Carotid US 1. Bilateral carotid bifurcation plaque resulting in less than 50% diameter ICA stenosis. 2. Antegrade bilateral vertebral arterial flow. Cardiac Echo EF 60-65%  HIV 10/29/19- nonreactive  Oncology History  Cervical cancer ([place])  03/11/2020 Initial Diagnosis   Cervical cancer ([place])   05/21/2020 -  Chemotherapy   The patient had palonosetron (ALOXI) injection 0.25 mg, 0.25 mg, Intravenous,  Once, 3 of 7 cycles Administration: 0.25 mg (05/21/2020), 0.25 mg (05/28/2020), 0.25 mg (06/08/2020) CISplatin (PLATINOL) 35 mg in sodium chloride 0.9 % 250 mL chemo infusion, 20 mg/m2 = 35 mg (100 % of original dose 20 mg/m2), Intravenous,  Once, 3 of 7 cycles Dose modification: 20 mg/m2 (original dose 20 mg/m2, Cycle 1, Reason: Other (see comments)) Administration: 35 mg (05/21/2020), 35 mg (05/28/2020), 35 mg (06/08/2020) fosaprepitant (EMEND) 150 mg in sodium chloride 0.9 % 145 mL IVPB, 150 mg, Intravenous,  Once, 3 of 7 cycles Administration: 150 mg (05/21/2020), 150 mg (05/28/2020), 150 mg (06/08/2020)  for chemotherapy treatment.     Patient Active Problem List   Diagnosis Date Noted  . Failure to thrive in adult   . Pressure injury of skin 06/27/2020  . Thrush   . Anemia of chronic disease   . Palliative care by specialist   . Generalized abdominal pain   . Acute blood loss anemia   . Intractable vomiting 06/21/2020  . Hypokalemia 06/21/2020  . Hyponatremia 06/21/2020  . Hypomagnesemia 06/21/2020  . Intractable nausea and vomiting 06/21/2020  . Nausea vomiting and diarrhea 06/21/2020  . 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[place]) 03/31/2020  . Goals of care, counseling/discussion 03/20/2020  . Cervical cancer ([place]) 03/11/2020  . Orthostatic hypotension   . Syncope 11/04/2019  . 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 Type 2 diabetes  mellitus with other specified complication ([place]) 82/99/3716  . ESRD on peritoneal dialysis ([place]) 11/03/2019  . Unable to care for self 11/03/2019  . Accelerated hypertension 11/03/2019  . Metabolic acidosis, increased anion gap (IAG)   . COVID-19   . Generalized weakness   . Recurrent syncope 10/28/2019  . 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 Complication of vascular access for dialysis 05/11/2018  . Respiratory arrest ([place]) 05/11/2018  . Hyperlipidemia 04/12/2018  . Pancreatitis, recurrent 11/01/2017  . Essential hypertension 11/01/2017  . ESRD (end stage renal disease) ([place]) 11/01/2017  . Recurrent pancreatitis 11/01/2017   Past Medical History:  Diagnosis Date  . Anemia 04/2018   low iron. to be started on supplements  . Cervical cancer ([place])   . CKD (chronic kidney disease)    Stage IV  . Complication of anesthesia    receceived too much anesthesia, that she was in coma for a couple days   . COVID-19 virus detected 10/28/2019  . Diabetes mellitus without complication ([place])    type II  . ESRD (end stage renal disease) ([place])   . Heart murmur    followed as a child only  . HSIL (high grade squamous intraepithelial lesion) on Pap smear of cervix   . Hyperlipidemia associated with type 2 diabetes mellitus ([place])   . Hypertension   . Pancreatitis   . Peripheral vascular disease [facility])    Past Surgical History:  Procedure Laterality  Date  . AMPUTATION TOE Left 2013   2nd toe. tip of toe (toe nail was infected)  . AV FISTULA PLACEMENT Left 05/11/2018   Procedure: ARTERIOVENOUS (AV) FISTULA CREATION;  Surgeon: Katha Cabal, MD;  Location: [facility] ORS;  Service: Vascular;  Laterality: Left;  . CATARACT EXTRACTION    . CERVICAL CONIZATION W/BX N/A 04/08/2020   Procedure: CONIZATION CERVIX WITH BIOPSY;  Surgeon: Mellody Drown, MD;  Location: [facility] ORS;  Service: Gynecology;  Laterality: N/A;  . CHOLECYSTECTOMY  2014  . COLONOSCOPY    . COLONOSCOPY WITH  PROPOFOL N/A 01/10/2018   Procedure: COLONOSCOPY WITH PROPOFOL;  Surgeon: Toledo, Benay Pike, MD;  Location: [facility] ENDOSCOPY;  Service: Gastroenterology;  Laterality: N/A;  . DIALYSIS/PERMA CATHETER INSERTION N/A 05/21/2018   Procedure: DIALYSIS/PERMA CATHETER INSERTION;  Surgeon: Algernon Huxley, MD;  Location: [place] CV LAB;  Service: Cardiovascular;  Laterality: N/A;  . DIALYSIS/PERMA CATHETER INSERTION N/A 07/14/2020   Procedure: DIALYSIS/PERMA CATHETER INSERTION;  Surgeon: Algernon Huxley, MD;  Location: [place] CV LAB;  Service: Cardiovascular;  Laterality: N/A;  . DIALYSIS/PERMA CATHETER REMOVAL N/A 04/11/2019   Procedure: DIALYSIS/PERMA CATHETER REMOVAL;  Surgeon: Algernon Huxley, MD;  Location: [place] CV LAB;  Service: Cardiovascular;  Laterality: N/A;  . EYE SURGERY Bilateral 2018   cataract extractions  . IR IMAGING GUIDED PORT INSERTION  06/05/2020  . THROMBECTOMY W/ EMBOLECTOMY  05/11/2018   Procedure: THROMBECTOMY ARTERIOVENOUS FISTULA;  Surgeon: Katha Cabal, MD;  Location: [facility] ORS;  Service: Vascular;;  . TUBAL LIGATION  1984   Family History  Problem Relation Age of Onset  . Stroke Mother   . Hypertension Mother   . Gout Mother   . Cancer Father   . Diabetes Sister   . Diabetes Maternal Grandmother   . Diabetes Maternal Grandfather   . Diabetes Paternal Grandmother   . Diabetes Paternal Grandfather    Social History   Socioeconomic History  . Marital status: Married    Spouse name: clarence  . Number of children: 2  . Years of education: Not on file  . Highest education level: Not on file  Occupational History    Comment: works nights  Tobacco Use  . Smoking status: Never Smoker  . Smokeless tobacco: Never Used  Vaping Use  . Vaping Use: Never used  Substance and Sexual Activity  . Alcohol use: No  . Drug use: Never  . Sexual activity: Not Currently  Other Topics Concern  . Not on file  Social History Narrative  . Not on file   Social  Determinants of Health   Financial Resource Strain: Not on file  Food Insecurity: Not on file  Transportation Needs: Not on file  Physical Activity: Not on file  Stress: Not on file  Social Connections: Not on file   Immunization History  Administered Date(s) Administered  . Hepb-cpg 05/02/2019, 07/01/2019  . Influenza-Unspecified 07/10/2018, 07/10/2018, 06/24/2019, 06/24/2019  . PPD Test 06/09/2017, 06/23/2017, 05/15/2018, 05/31/2018, 05/31/2018, 03/11/2019, 03/11/2019, 06/24/2019, 06/24/2019, 08/20/2019, 08/20/2019  . Pneumococcal Conjugate-13 10/05/2015, 07/11/2018  . Pneumococcal Polysaccharide-23 10/04/2013, 07/11/2018, 07/11/2018  . Tdap 10/19/2015   Allergies  Allergen Reactions  . Amlodipine Swelling    Knees down to ankles Knees down to ankles  . Hctz [Hydrochlorothiazide]     pancreatitis  . Heparin Other (See Comments)    Pt reports cardiac arrest when given heparin  . Lmw Heparin     Reports cardiac arrest when given heparin  . Other Other (See  Comments)    Reports cardiac arrest when given during surgery Reports cardiac arrest when given during surgery Reports cardiac arrest when given during surgery   . Sulfa Antibiotics Rash  . Dairy Aid [Lactase]     Runny nose  . Hydralazine Nausea Only   Current Outpatient Medications on File Prior to Visit  Medication Sig Dispense Refill  . calcitRIOL (ROCALTROL) 0.25 MCG capsule Take 0.25 mcg by mouth daily.    . calcium acetate (PHOSLO) 667 MG capsule Take 1,334 mg by mouth 2 (two) times daily with a meal.     . carvedilol (COREG) 25 MG tablet Take 25 mg by mouth 2 (two) times daily with a meal.     . cloNIDine (CATAPRES - DOSED IN MG/24 HR) 0.1 mg/24hr patch Place 1 patch (0.1 mg total) onto the skin once a week. 4 patch 12  . clopidogrel (PLAVIX) 75 MG tablet Take 75 mg by mouth daily.     Marland Kitchen DILT-XR 180 MG 24 hr capsule Take 180 mg by mouth daily.     . diphenoxylate-atropine (LOMOTIL) 2.5-0.025 MG tablet Take 1  tablet by mouth 4 (four) times daily as needed for diarrhea or loose stools. 30 tablet 0  . fluconazole (DIFLUCAN) 100 MG tablet Take 1 tablet (100 mg total) by mouth daily. 10 tablet 0  . lidocaine-prilocaine (EMLA) cream Apply 1 application topically as needed. Apply to port 1-2 hours prior to chemotherapy, cover with plastic wrap. 30 g 2  . multivitamin (RENA-VIT) TABS tablet Take 1 tablet by mouth daily before breakfast. 30 tablet 0  . Nutritional Supplements (FEEDING SUPPLEMENT, NEPRO CARB STEADY,) LIQD Take 237 mLs by mouth 2 (two) times daily between meals. 237 mL 0  . ondansetron (ZOFRAN) 4 MG tablet Take 4 mg by mouth 2 (two) times daily.     . Potassium Chloride ER 20 MEQ TBCR Take 1 tablet by mouth daily.     . prochlorperazine (COMPAZINE) 10 MG tablet Take 1 tablet (10 mg total) by mouth every 6 (six) hours as needed (Nausea or vomiting). 60 tablet 1  . [DISCONTINUED] diltiazem (CARDIZEM CD) 120 MG 24 hr capsule Take 120 mg by mouth daily.     No current facility-administered medications on file prior to visit.   Review of Systems General:  weak Skin: no complaints Eyes: no complaints HEENT: no complaints Breasts: no complaints Pulmonary: no complaints Cardiac: no complaints Genitourinary/Sexual: no complaints Musculoskeletal: no complaints Hematology: no complaints Neurologic/Psych: no complaints  Objective:  Physical Examination:  Today's Vitals   10/14/20 1114  Weight: 150 lb (68 kg)   Body mass index is 25.75 kg/m.  Vitals:   10/14/20 1114  BP: (!) 215/88  Pulse: 70  Resp: 20  Temp: 98.7 F (37.1 C)  SpO2: 100%    ECOG Performance Status: 2 - Symptomatic, <50% confined to bed  GENERAL: Patient is a chronically ill appearing female in no acute distress HEENT:  PERRL, neck supple with midline trachea. Thyroid without masses.  NODES:  No cervical, supraclavicular, axillary, or inguinal lymphadenopathy palpated.  LUNGS:  Clear to auscultation bilaterally.   No wheezes or rhonchi. HEART:  Regular rate and rhythm. No murmur appreciated. ABDOMEN:  Soft, nontender.  Positive, normoactive bowel sounds. No masses.  MSK:  No focal spinal tenderness to palpation. Full range of motion bilaterally in the upper extremities. EXTREMITIES:  No peripheral edema.   SKIN:  Clear with no obvious rashes or skin changes. No nail dyscrasia. NEURO:  Nonfocal. Well  oriented.  Appropriate affect.  Pelvic exam: exam chaperoned by nursing: EGBUS: normal.  Vagina: normal atrophic mucosa with some thin discharge. Cervix: no obvious tumor, smooth Bimanual: normal uterus. No masses. Parametria normal.     Lab Review CMP Latest Ref Rng & Units 07/01/2020 07/01/2020 06/30/2020  Glucose 70 - 99 mg/dL - 159(H) 83  BUN 8 - 23 mg/dL - 52(H) 59(H)  Creatinine 0.44 - 1.00 mg/dL - 11.25(H) 12.27(H)  Sodium 135 - 145 mmol/L - 135 134(L)  Potassium 3.5 - 5.1 mmol/L 3.5 2.7(LL) 2.9(L)  Chloride 98 - 111 mmol/L - 100 101  CO2 22 - 32 mmol/L - 23 20(L)  Calcium 8.9 - 10.3 mg/dL - 7.2(L) 7.1(L)  Total Protein 6.5 - 8.1 g/dL - - -  Total Bilirubin 0.3 - 1.2 mg/dL - - -  Alkaline Phos 38 - 126 U/L - - -  AST 15 - 41 U/L - - -  ALT 0 - 44 U/L - - -   Lab Results  Component Value Date   WBC 4.9 06/29/2020   HGB 8.0 (L) 07/01/2020   HCT 25.8 (L) 06/29/2020   MCV 91.8 06/29/2020   PLT 127 (L) 06/29/2020      Assessment:  Tracy Jennings is a 65 y.o. female diagnosed with clear cell adenocarcinoma involving the cervix, unclear if cervical or uterine primary, but strongly favor cervical as this was detected by PAP/cervical biopsy with no bleeding and the bulk of the cancer is in the cervix based on PET scan and cone/D&C.  Clinically she has stage I disease, but difficult to assign substage.  The PET/CT did not really show a tumor mass in the cervix despite some FDG positivity there.  It could also be a primary endometrial cancer, and this cannot be ruled out completely.  No evidence  of metastatic disease on PET.   She is s/p 3 cycles of cisplatin and concurrent radiation completed 06/08/20. Treatment complicated by anemia requiring transfusion, nausea, diarrhea, and electrolyte abnormalities due to colitis. Completed brachytherapy with Dr. Christel Mormon 08/14/20 and got blood transfusion for anemia, No evidence of disease on exam today.  ESRD now on hemodialysis. Renal failure associated anemia. Her BP was low so they stopped one of her antihypertensive drugs, but BP very high today.  No headache or other symptoms.   Medical co-morbidities complicating care: AODM (not on meds now, but BS high 9/47), HTN (systolic high today), ESRD on dialysis, carotid stenosis on Plavix and ASA  Plan:   Problem List Items Addressed This Visit      Genitourinary   Malignant neoplasm of endocervix [facility]) - Primary     She has follow up planned with Dr Christel Mormon and will have PET/CT 11/18/20.  Will also order ABC and CMP next week.  She will call nephrology today about restarting the BP med she discontinued in consultation with her hemodialysis team.    RTC in 3 months for follow up exam in [place].  The patient's diagnosis, an outline of the further diagnostic and laboratory studies which will be required, the recommendation, and alternatives were discussed.  All questions were answered to the patient's satisfaction.  Verlon Au, NP  I personally interviewed and examined the patient. Agreed with the above/below plan of care. I have directly contributed to assessment and plan of care of this patient and educated and discussed with patient and family.  Mellody Drown, MD

## 2020-10-15 ENCOUNTER — Telehealth: Payer: Self-pay | Admitting: Family Medicine

## 2020-10-15 ENCOUNTER — Telehealth: Payer: Self-pay

## 2020-10-15 DIAGNOSIS — C530 Malignant neoplasm of endocervix: Secondary | ICD-10-CM

## 2020-10-15 NOTE — Telephone Encounter (Signed)
Pt reports she got the moderna vaccine at the local [facility].  She has not gotten the booster. Pt states it is convenient for [facility] to [facility] to [facility], and she plans to get the booster.

## 2020-10-15 NOTE — Telephone Encounter (Signed)
Called and spoke with Tracy Jennings regarding the need to have update labs performed. She states she can come this Friday. She also reports she has a port and does not recall having this flushed since completing chemotherapy. Port flush and labs arranged. I will reach out to scheduling to arrange having routine port flushes set up.

## 2020-10-16 ENCOUNTER — Telehealth: Payer: Self-pay

## 2020-10-16 ENCOUNTER — Inpatient Hospital Stay: Payer: BC Managed Care – PPO

## 2020-10-16 DIAGNOSIS — C530 Malignant neoplasm of endocervix: Secondary | ICD-10-CM

## 2020-10-16 LAB — CBC WITH DIFFERENTIAL/PLATELET
Abs Immature Granulocytes: 0.01 10*3/uL (ref 0.00–0.07)
Basophils Absolute: 0 10*3/uL (ref 0.0–0.1)
Basophils Relative: 1 %
Eosinophils Absolute: 1 10*3/uL — ABNORMAL HIGH (ref 0.0–0.5)
Eosinophils Relative: 17 %
HCT: 27.7 % — ABNORMAL LOW (ref 36.0–46.0)
Hemoglobin: 8.3 g/dL — ABNORMAL LOW (ref 12.0–15.0)
Immature Granulocytes: 0 %
Lymphocytes Relative: 5 %
Lymphs Abs: 0.3 10*3/uL — ABNORMAL LOW (ref 0.7–4.0)
MCH: 29.3 pg (ref 26.0–34.0)
MCHC: 30 g/dL (ref 30.0–36.0)
MCV: 97.9 fL (ref 80.0–100.0)
Monocytes Absolute: 0.5 10*3/uL (ref 0.1–1.0)
Monocytes Relative: 9 %
Neutro Abs: 3.9 10*3/uL (ref 1.7–7.7)
Neutrophils Relative %: 68 %
Platelets: 239 10*3/uL (ref 150–400)
RBC: 2.83 MIL/uL — ABNORMAL LOW (ref 3.87–5.11)
RDW: 16.7 % — ABNORMAL HIGH (ref 11.5–15.5)
WBC: 5.7 10*3/uL (ref 4.0–10.5)
nRBC: 0 % (ref 0.0–0.2)

## 2020-10-16 LAB — COMPREHENSIVE METABOLIC PANEL
ALT: 6 U/L (ref 0–44)
AST: 10 U/L — ABNORMAL LOW (ref 15–41)
Albumin: 3.1 g/dL — ABNORMAL LOW (ref 3.5–5.0)
Alkaline Phosphatase: 42 U/L (ref 38–126)
Anion gap: 14 (ref 5–15)
BUN: 44 mg/dL — ABNORMAL HIGH (ref 8–23)
CO2: 22 mmol/L (ref 22–32)
Calcium: 7.6 mg/dL — ABNORMAL LOW (ref 8.9–10.3)
Chloride: 105 mmol/L (ref 98–111)
Creatinine, Ser: 6.31 mg/dL — ABNORMAL HIGH (ref 0.44–1.00)
GFR, Estimated: 7 mL/min — ABNORMAL LOW (ref >60)
Glucose, Bld: 94 mg/dL (ref 70–99)
Potassium: 4.2 mmol/L (ref 3.5–5.1)
Sodium: 141 mmol/L (ref 135–145)
Total Bilirubin: 0.4 mg/dL (ref 0.3–1.2)
Total Protein: 7.8 g/dL (ref 6.5–8.1)

## 2020-10-16 MED ORDER — HEPARIN SOD (PORK) LOCK FLUSH 100 UNIT/ML IV SOLN
500.0000 [IU] | Freq: Once | INTRAVENOUS | Status: AC
  Administered 2020-10-16: 500 [IU] via INTRAVENOUS
  Filled 2020-10-16: qty 5

## 2020-10-16 MED ORDER — SODIUM CHLORIDE 0.9% FLUSH
10.0000 mL | Freq: Once | INTRAVENOUS | Status: AC
  Administered 2020-10-16: 10 mL via INTRAVENOUS
  Filled 2020-10-16: qty 10

## 2020-10-16 NOTE — Telephone Encounter (Signed)
Labs noted. Call placed to [name] to relay results. Received voicemail. Left her a message that lab results were baseline for her. If she has any questions she can call our office.

## 2020-11-25 ENCOUNTER — Ambulatory Visit: Payer: Medicare Other

## 2020-11-26 ENCOUNTER — Inpatient Hospital Stay: Payer: Medicare Other | Attending: Oncology

## 2021-01-13 ENCOUNTER — Inpatient Hospital Stay: Payer: Medicare Other | Attending: Obstetrics and Gynecology

## 2021-01-14 ENCOUNTER — Inpatient Hospital Stay: Payer: Medicare Other

## 2021-01-20 ENCOUNTER — Telehealth: Payer: Self-pay

## 2021-01-20 NOTE — Telephone Encounter (Signed)
Did not show for her 01/13/21 gyn onc appointment with Dr. Fransisca Connors. Scheduling message sent to reschedule.

## 2021-01-27 ENCOUNTER — Inpatient Hospital Stay: Payer: BC Managed Care – PPO | Attending: Obstetrics and Gynecology | Admitting: Obstetrics and Gynecology

## 2021-01-27 ENCOUNTER — Other Ambulatory Visit: Payer: Self-pay

## 2021-01-27 VITALS — BP 212/71 | HR 97 | Temp 95.0°F | Resp 20 | Wt 141.9 lb

## 2021-01-27 DIAGNOSIS — Z79899 Other long term (current) drug therapy: Secondary | ICD-10-CM | POA: Diagnosis not present

## 2021-01-27 DIAGNOSIS — I120 Hypertensive chronic kidney disease with stage 5 chronic kidney disease or end stage renal disease: Secondary | ICD-10-CM | POA: Insufficient documentation

## 2021-01-27 DIAGNOSIS — Z923 Personal history of irradiation: Secondary | ICD-10-CM | POA: Insufficient documentation

## 2021-01-27 DIAGNOSIS — E1136 Type 2 diabetes mellitus with diabetic cataract: Secondary | ICD-10-CM | POA: Insufficient documentation

## 2021-01-27 DIAGNOSIS — Z9221 Personal history of antineoplastic chemotherapy: Secondary | ICD-10-CM | POA: Diagnosis not present

## 2021-01-27 DIAGNOSIS — E1122 Type 2 diabetes mellitus with diabetic chronic kidney disease: Secondary | ICD-10-CM | POA: Insufficient documentation

## 2021-01-27 DIAGNOSIS — C539 Malignant neoplasm of cervix uteri, unspecified: Secondary | ICD-10-CM | POA: Diagnosis present

## 2021-01-27 DIAGNOSIS — C530 Malignant neoplasm of endocervix: Secondary | ICD-10-CM

## 2021-01-27 DIAGNOSIS — Z7902 Long term (current) use of antithrombotics/antiplatelets: Secondary | ICD-10-CM | POA: Diagnosis not present

## 2021-01-27 DIAGNOSIS — Z8616 Personal history of COVID-19: Secondary | ICD-10-CM | POA: Diagnosis not present

## 2021-01-27 DIAGNOSIS — Z992 Dependence on renal dialysis: Secondary | ICD-10-CM | POA: Insufficient documentation

## 2021-01-27 DIAGNOSIS — N186 End stage renal disease: Secondary | ICD-10-CM | POA: Insufficient documentation

## 2021-01-27 NOTE — Progress Notes (Signed)
Gynecologic Oncology History and Physical [facility]  [phone] [phone]  Patient Care Team: Sharyne Peach, MD as PCP - General (Family Medicine) Wellington Hampshire, MD as PCP - Cardiology (Cardiology) Noreene Filbert, MD as Radiation Oncologist (Radiation Oncology) Clent Jacks, RN as Oncology Nurse Navigator   Name of the patient: Tracy Jennings  [id number]  [birth year]   Date of visit: 04/01/2020  Gynecologic Oncology Interval Visit   Referring Provider: Dr Leonides Schanz  Chief Concern: Cervical adenocarcinoma  Subjective:  Tracy Jennings is a 65 y.o. female with ESRD on dialysis who is seen in consultation from Dr Leonides Schanz for adenocarcinoma of the cervix diagnosed 6/21 s/p 3 cycles of cisplatin with pelvic radiation followed by brachytherapy who returns to clinic for surveillance.   Completed brachytherapy at [facility] 5 months ago and got blood transfusion.  Treatment complicated by electrolyte abnormalities, diarrhea, nausea, anemia requiring multiple transfusions. Continues on dialysis and regaining strength.   Saw Dr Christel Mormon 3/22 and PET CT was without disease.  Walks around at home and trying to be more active.  Eating well now and tolerating diet.    History of ESRD, previously considered for kidney transplant, more recently note states, appears patient has 2-3 year wait following completion of treatment before she could consider her for listing. Case was closed and recommended she continue treatment with current dialysis unit, [facility].  She was on peritoneal dialysis, but now doing hemodialysis 3x per week.   Gynecology Oncology History PAP: Atypical squamous cells cannot exclude HSIL (ASC-H)  Seen by Dr Larey Days 03/10/20 On exam: Vagina normal, well-estrogenized vaginal mucosa, no evidence of vaginal neoplasia, Cervix: No overt carcinoma, well-visualized  Colposcopic exam: TZ not seen entirely, AWE not seen. No lesions  seen.  ECC was done, random cervical biopsies were taken Pathology 10:00 - suspicious for adenocarcinoma, 6:00 bx and ECC - adenocarcinoma  She was initially seen on 03/11/2020 in the [facility] and cervix looked and felt normal.  PET/CT to evaluate origin of the cancer (uterine vs. cervical origin) and assess for extent of disease.  PET scan 03/13/20 shows some low level FDG activity in the cervix (none in uterus), but no prominent cervical mass.  Some PET activity in pelvic nodes, but not above blood pool and no other primary or metastatic disease noted.  Initial plan was to proceed with chemoradiation for cervical cancer.  However, without any knowledge of the size or depth of invasion of the cancer in the cervix based on exam and imaging it was decided that a cone biopsy and D&C was needed to confirm that there is more than 3 mm of invasive cancer in the cervix and that there was no endometrial primary.  If cancer was confined to the cervix and invasion and < 16m invasion, this could be sufficiently treated with a large cone biopsy with negative margins, and it would be better to avoid the potential toxicity of chemoradiation in view of her existing vascular disease and dialysis.  If she is proven to have a cervical cancer with > 3 mm of invasion or LVSI would proceed with chemoradiation.   History significant for significant vascular disease with end stage renal disease, on peritoneal dialysis and carotid stenosis. [facility] for thrombosis prophylaxis. She had a thrombosis of her AVF fistula in 2019 after this was placed and had cardiac arrest post thrombectomy.  She was seen by cardiology on  03/30/2020 and felt to be doing well from cardiac perspective and given clearance for surgery. Echo in 10/2019 revealed EF 60-65%. EKG 03/30/20 demonstrated NSR, 77 bpm, rare PVC, lVH, poor R wave progression along precordial leads, no acute st/t changes. Blood pressure is  therapeutically elevated due to history of orthostatic hypotension. She is felt to be low risk for noncardiac surgery. Vascular surgery recommended holding clopidogrel for 5 days prior to surgery and for 2 days post operatively. She continues peritoneal dialysis.   Cone biopsy 04/08/20  A. CERVIX, CONIZATION BIOPSY:  - INVASIVE ADENOCARCINOMA WITH INVOLVEMENT OF ENDOCERVICAL AND DEEP MARGINS.  B. ENDOCERVIX, CURETTAGE:  - SCANT DETACHED AND DEGENERATED CELLS, SUSPICIOUS FOR MALIGNANCY.  C. ENDOMETRIUM, CURETTAGE: - INVASIVE ADENOCARCINOMA.   Comment:  Sections demonstrate an infiltrative adenocarcinoma with areas of marked nuclear pleomorphism. This high-grade adenocarcinoma shows diffuse  positive staining for p16. Staining for p53 appears to demonstrate a null (mutated) pattern, which would rule out an HPV-associated endocervical adenocarcinoma and would favor the diagnosis of high-grade serous carcinoma. However, there is not an internal positive control, and therefore the stain cannot be interpreted as null with complete certainty. MMR stains were performed to possibly aid in diagnosing an endometrioid carcinoma, with no loss of MMR proteins.  The case has been sent for expert consultation and second opinion, with an addendum to follow.   ([facility] path review below) A. 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 The ectocervical margin appears uninvolved. No lymphovascular invasion is identified. Chromogenic in situ hybridization for high risk HPV E6/7 mRNA (types 16, 18, 26, 31, 33, 35, 39, 45, 51, 52, 53, 56, 58, 59, 66, 68, 73 and 82) is negative in the cells of interest.  Immunohistochemistry was performed on blocks A2 and A5 at [facility] to characterize the pathologic process and demonstrates the following immunophenotype in the cells of interest: POSITIVE: HNF1B, AMACR, Napsin A, p16 (diffuse), Ki-67 elevated, NEGATIVE:  ER, PR. This staining profile supports the above diagnosis.  B. Endocervix, curettage: Scant detached atypical cells with degenerative changes, suspicious for adenocarcinoma.  C. Endometrium, curettage: Clear cell adenocarcinoma, present in a minute fragment of stroma, see COMMENT. COMMENT: The sections demonstrate an infiltrative glandular proliferation of malignant cells with marked nuclear pleomorphism, undermining endocervical and ectocervical epithelium at the squamocolumnar junction. The lesion measures at least 8 mm in lateral extent and invades 1 mm of cervical stroma, but extension to the endocervical and deep margins precludes definitive measurement. Although the bulk of the lesion appears in the cone biopsy, an endometrial primary cannot be excluded.  In view of pathology report and PET scan believe this is most likely a primary cervical clear cell adenocarcinoma and she started chemoradiation 05/11/20. Exam at that time showed Necrotic debris over cone bed and this was removed with swab.  Granulation tissue underneath.   05/21/20-06/08/20- 3 cycles cisplatin 05/13/20-10/7-  EBRT w/ Dr. Baruch Gouty at [facility] 06/21/20 admitted to [facility] for nausea/vomiting/diarrhea due to colitis from radiation 07/06/20-08/13/20- HDR w/ Dr. Christel Mormon at [place] scan 10/21 IMPRESSION: 1. Relatively decompressed, but thick walled colon, consistent with colitis. Favor infection. 2. Decrease in trace abdominopelvic fluid with peritoneal dialysis catheter in place. 3. New hypoattenuation within the superior anterior spleen, suspicious for infarct. 4. Subtle pericystic edema, possibly radiation induced. Recommend clinical exclusion of infectious cystitis.  5. Coronary artery atherosclerosis. Aortic Atherosclerosis 6. Left adrenal nodule, favored to represent an adenoma.  Medical history Menopause age 61.  No bleeding since.  No pelvic pain.   UTI a month ago and some vaginal discharge.   On peritoneal dialysis for  about a year and stopped working. Takes iron shots for anemia.  Patient hospitalized three days for mild COVID19 pneumonia in February 2021 2/21 Carotid US 1. Bilateral carotid bifurcation plaque resulting in less than 50% diameter ICA stenosis. 2. Antegrade bilateral vertebral arterial flow. Cardiac Echo EF 60-65%  HIV 10/29/19- nonreactive  Oncology History  Cervical cancer ([place])  03/11/2020 Initial Diagnosis   Cervical cancer ([place])   05/21/2020 -  Chemotherapy   The patient had palonosetron (ALOXI) injection 0.25 mg, 0.25 mg, Intravenous,  Once, 3 of 7 cycles Administration: 0.25 mg (05/21/2020), 0.25 mg (05/28/2020), 0.25 mg (06/08/2020) CISplatin (PLATINOL) 35 mg in sodium chloride 0.9 % 250 mL chemo infusion, 20 mg/m2 = 35 mg (100 % of original dose 20 mg/m2), Intravenous,  Once, 3 of 7 cycles Dose modification: 20 mg/m2 (original dose 20 mg/m2, Cycle 1, Reason: Other (see comments)) Administration: 35 mg (05/21/2020), 35 mg (05/28/2020), 35 mg (06/08/2020) fosaprepitant (EMEND) 150 mg in sodium chloride 0.9 % 145 mL IVPB, 150 mg, Intravenous,  Once, 3 of 7 cycles Administration: 150 mg (05/21/2020), 150 mg (05/28/2020), 150 mg (06/08/2020)  for chemotherapy treatment.     Patient Active Problem List   Diagnosis Date Noted  . Failure to thrive in adult   . Pressure injury of skin 06/27/2020  . Thrush   . Anemia of chronic disease   . Palliative care by specialist   . Generalized abdominal pain   . Acute blood loss anemia   . Intractable vomiting 06/21/2020  . Hypokalemia 06/21/2020  . Hyponatremia 06/21/2020  . Hypomagnesemia 06/21/2020  . Intractable nausea and vomiting 06/21/2020  . Nausea vomiting and diarrhea 06/21/2020  . 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[place]) 03/31/2020  . Goals of care, counseling/discussion 03/20/2020  . Cervical cancer ([place]) 03/11/2020  . Orthostatic hypotension   . Syncope 11/04/2019  . 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 Type  2 diabetes mellitus with other specified complication ([place]) 16/06/9603  . ESRD on peritoneal dialysis ([place]) 11/03/2019  . Unable to care for self 11/03/2019  . Accelerated hypertension 11/03/2019  . Metabolic acidosis, increased anion gap (IAG)   . COVID-19   . Generalized weakness   . Recurrent syncope 10/28/2019  . 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 Complication of vascular access for dialysis 05/11/2018  . Respiratory arrest ([place]) 05/11/2018  . Hyperlipidemia 04/12/2018  . Pancreatitis, recurrent 11/01/2017  . Essential hypertension 11/01/2017  . ESRD (end stage renal disease) ([place]) 11/01/2017  . Recurrent pancreatitis 11/01/2017   Past Medical History:  Diagnosis Date  . Anemia 04/2018   low iron. to be started on supplements  . Cervical cancer ([place])   . CKD (chronic kidney disease)    Stage IV  . Complication of anesthesia    receceived too much anesthesia, that she was in coma for a couple days   . COVID-19 virus detected 10/28/2019  . Diabetes mellitus without complication ([place])    type II  . ESRD (end stage renal disease) ([place])   . Heart murmur    followed as a child only  . HSIL (high grade squamous intraepithelial lesion) on Pap smear of cervix   . Hyperlipidemia associated with type 2 diabetes mellitus ([place])   . Hypertension   . Pancreatitis   . Peripheral vascular disease [facility])    Past Surgical History:  Procedure Laterality  Date  . AMPUTATION TOE Left 2013   2nd toe. tip of toe (toe nail was infected)  . AV FISTULA PLACEMENT Left 05/11/2018   Procedure: ARTERIOVENOUS (AV) FISTULA CREATION;  Surgeon: Katha Cabal, MD;  Location: [facility] ORS;  Service: Vascular;  Laterality: Left;  . CATARACT EXTRACTION    . CERVICAL CONIZATION W/BX N/A 04/08/2020   Procedure: CONIZATION CERVIX WITH BIOPSY;  Surgeon: Mellody Drown, MD;  Location: [facility] ORS;  Service: Gynecology;  Laterality: N/A;  . CHOLECYSTECTOMY  2014  . COLONOSCOPY    .  COLONOSCOPY WITH PROPOFOL N/A 01/10/2018   Procedure: COLONOSCOPY WITH PROPOFOL;  Surgeon: Toledo, Benay Pike, MD;  Location: [facility] ENDOSCOPY;  Service: Gastroenterology;  Laterality: N/A;  . DIALYSIS/PERMA CATHETER INSERTION N/A 05/21/2018   Procedure: DIALYSIS/PERMA CATHETER INSERTION;  Surgeon: Algernon Huxley, MD;  Location: [place] CV LAB;  Service: Cardiovascular;  Laterality: N/A;  . DIALYSIS/PERMA CATHETER INSERTION N/A 07/14/2020   Procedure: DIALYSIS/PERMA CATHETER INSERTION;  Surgeon: Algernon Huxley, MD;  Location: [place] CV LAB;  Service: Cardiovascular;  Laterality: N/A;  . DIALYSIS/PERMA CATHETER REMOVAL N/A 04/11/2019   Procedure: DIALYSIS/PERMA CATHETER REMOVAL;  Surgeon: Algernon Huxley, MD;  Location: [place] CV LAB;  Service: Cardiovascular;  Laterality: N/A;  . EYE SURGERY Bilateral 2018   cataract extractions  . IR IMAGING GUIDED PORT INSERTION  06/05/2020  . THROMBECTOMY W/ EMBOLECTOMY  05/11/2018   Procedure: THROMBECTOMY ARTERIOVENOUS FISTULA;  Surgeon: Katha Cabal, MD;  Location: [facility] ORS;  Service: Vascular;;  . TUBAL LIGATION  1984   Family History  Problem Relation Age of Onset  . Stroke Mother   . Hypertension Mother   . Gout Mother   . Cancer Father   . Diabetes Sister   . Diabetes Maternal Grandmother   . Diabetes Maternal Grandfather   . Diabetes Paternal Grandmother   . Diabetes Paternal Grandfather    Social History   Socioeconomic History  . Marital status: Married    Spouse name: clarence  . Number of children: 2  . Years of education: Not on file  . Highest education level: Not on file  Occupational History    Comment: works nights  Tobacco Use  . Smoking status: Never Smoker  . Smokeless tobacco: Never Used  Vaping Use  . Vaping Use: Never used  Substance and Sexual Activity  . Alcohol use: No  . Drug use: Never  . Sexual activity: Not Currently  Other Topics Concern  . Not on file  Social History Narrative  . Not on file    Social Determinants of Health   Financial Resource Strain: Not on file  Food Insecurity: Not on file  Transportation Needs: Not on file  Physical Activity: Not on file  Stress: Not on file  Social Connections: Not on file   Immunization History  Administered Date(s) Administered  . Hepb-cpg 05/02/2019, 07/01/2019  . Influenza-Unspecified 07/10/2018, 07/10/2018, 06/24/2019, 06/24/2019  . PPD Test 06/09/2017, 06/23/2017, 05/15/2018, 05/31/2018, 05/31/2018, 03/11/2019, 03/11/2019, 06/24/2019, 06/24/2019, 08/20/2019, 08/20/2019  . Pneumococcal Conjugate-13 10/05/2015, 07/11/2018  . Pneumococcal Polysaccharide-23 10/04/2013, 07/11/2018, 07/11/2018  . Tdap 10/19/2015   Allergies  Allergen Reactions  . Amlodipine Swelling    Knees down to ankles Knees down to ankles  . Hctz [Hydrochlorothiazide]     pancreatitis  . Heparin Other (See Comments)    Pt reports cardiac arrest when given heparin  . Lmw Heparin     Reports cardiac arrest when given heparin  . Other Other (See  Comments)    Reports cardiac arrest when given during surgery Reports cardiac arrest when given during surgery Reports cardiac arrest when given during surgery   . Sulfa Antibiotics Rash  . Dairy Aid [Lactase]     Runny nose  . Hydralazine Nausea Only   Current Outpatient Medications on File Prior to Visit  Medication Sig Dispense Refill  . amitriptyline (ELAVIL) 10 MG tablet daily as needed.    Marland Kitchen amLODipine (NORVASC) 10 MG tablet Take by mouth.    . calcitRIOL (ROCALTROL) 0.25 MCG capsule Take 0.25 mcg by mouth daily.    . calcium acetate (PHOSLO) 667 MG capsule Take 1,334 mg by mouth 2 (two) times daily with a meal.     . carvedilol (COREG CR) 40 MG 24 hr capsule Take by mouth.    . carvedilol (COREG) 25 MG tablet Take 25 mg by mouth 2 (two) times daily with a meal.     . cloNIDine (CATAPRES - DOSED IN MG/24 HR) 0.1 mg/24hr patch Place 1 patch (0.1 mg total) onto the skin once a week. 4 patch 12  .  clopidogrel (PLAVIX) 75 MG tablet Take 75 mg by mouth daily.     Marland Kitchen DILT-XR 180 MG 24 hr capsule Take 180 mg by mouth daily.     . diphenoxylate-atropine (LOMOTIL) 2.5-0.025 MG tablet Take 1 tablet by mouth 4 (four) times daily as needed for diarrhea or loose stools. 30 tablet 0  . fluconazole (DIFLUCAN) 100 MG tablet Take 1 tablet (100 mg total) by mouth daily. 10 tablet 0  . lidocaine-prilocaine (EMLA) cream Apply 1 application topically as needed. Apply to port 1-2 hours prior to chemotherapy, cover with plastic wrap. 30 g 2  . multivitamin (RENA-VIT) TABS tablet Take 1 tablet by mouth daily before breakfast. 30 tablet 0  . Nutritional Supplements (FEEDING SUPPLEMENT, NEPRO CARB STEADY,) LIQD Take 237 mLs by mouth 2 (two) times daily between meals. 237 mL 0  . ondansetron (ZOFRAN) 4 MG tablet Take 4 mg by mouth 2 (two) times daily.     . Potassium Chloride ER 20 MEQ TBCR Take 1 tablet by mouth daily.     . prochlorperazine (COMPAZINE) 10 MG tablet Take 1 tablet (10 mg total) by mouth every 6 (six) hours as needed (Nausea or vomiting). 60 tablet 1  . [DISCONTINUED] diltiazem (CARDIZEM CD) 120 MG 24 hr capsule Take 120 mg by mouth daily.     No current facility-administered medications on file prior to visit.   Review of Systems General:  no complaints Skin: no complaints Eyes: no complaints HEENT: no complaints Breasts: no complaints Pulmonary: no complaints Cardiac: no complaints Gastrointestinal: no complaints Genitourinary/Sexual: no complaints Ob/Gyn: no complaints Musculoskeletal: no complaints Hematology: no complaints Neurologic/Psych: no complaints  Objective:  Physical Examination:  Today's Vitals   01/27/21 1055 01/27/21 1059  BP: (!) 212/71   Pulse: 97   Resp: 20   Temp: (!) 95 F (35 C)   SpO2: 100%   Weight: 141 lb 14.4 oz (64.4 kg)   PainSc:  0-No pain   Body mass index is 24.36 kg/m.  ECOG Performance Status: 2 - Symptomatic, <50% confined to  bed  GENERAL: Patient is a well appearing female in no acute distress. Thin build HEENT:  Sclera clear. Anicteric NODES:  Negative axillary, supraclavicular, inguinal lymph node survery LUNGS:  Clear to auscultation bilaterally.   HEART:  Regular rate and rhythm.  ABDOMEN:  Soft, nontender.  No hernias, incisions well healed. No masses  or ascites EXTREMITIES:  No peripheral edema. Atraumatic. No cyanosis SKIN:  Clear with no obvious rashes or skin changes.  NEURO:  Nonfocal. Well oriented.  Appropriate affect.  Pelvic exam: exam chaperoned by nursing: EGBUS: normal.  Vagina: normal atrophic mucosa with some thin discharge. Cervix: no obvious tumor, smooth Bimanual: normal uterus. No masses. Parametria normal.     Lab Review CMP Latest Ref Rng & Units 10/16/2020 07/01/2020 07/01/2020  Glucose 70 - 99 mg/dL 94 - 159(H)  BUN 8 - 23 mg/dL 44(H) - 52(H)  Creatinine 0.44 - 1.00 mg/dL 6.31(H) - 11.25(H)  Sodium 135 - 145 mmol/L 141 - 135  Potassium 3.5 - 5.1 mmol/L 4.2 3.5 2.7(LL)  Chloride 98 - 111 mmol/L 105 - 100  CO2 22 - 32 mmol/L 22 - 23  Calcium 8.9 - 10.3 mg/dL 7.6(L) - 7.2(L)  Total Protein 6.5 - 8.1 g/dL 7.8 - -  Total Bilirubin 0.3 - 1.2 mg/dL 0.4 - -  Alkaline Phos 38 - 126 U/L 42 - -  AST 15 - 41 U/L 10(L) - -  ALT 0 - 44 U/L 6 - -   Lab Results  Component Value Date   WBC 5.7 10/16/2020   HGB 8.3 (L) 10/16/2020   HCT 27.7 (L) 10/16/2020   MCV 97.9 10/16/2020   PLT 239 10/16/2020     Assessment:  Tyreshia D Micale is a 65 y.o. female diagnosed with clear cell adenocarcinoma involving the cervix, unclear if cervical or uterine primary, but strongly favor cervical as this was detected by PAP/cervical biopsy with no bleeding and the bulk of the cancer is in the cervix based on PET scan and cone/D&C.  Clinically she has stage I disease, but difficult to assign substage.  The PET/CT did not really show a tumor mass in the cervix despite some FDG positivity there.  It could also  be a primary endometrial cancer, and this cannot be ruled out completely.  No evidence of metastatic disease on PET.   She is s/p 3 cycles of cisplatin and concurrent radiation completed 06/08/20. Treatment complicated by anemia requiring transfusion, nausea, diarrhea, and electrolyte abnormalities due to colitis. Completed brachytherapy with Dr. Christel Mormon 08/14/20 and got blood transfusion for anemia. Saw Dr Christel Mormon 3/22 and PET CT was without disease.  No evidence of disease on exam today.  ESRD now on hemodialysis. Renal failure associated anemia. Her BP was low so they stopped one of her antihypertensive drugs, but BP very high today.  She threw up her BP meds this morning so instructed her to take them again when she gets home.  No headache or other symptoms.   Medical co-morbidities complicating care: AODM (not on meds now, but BS high 9/56), HTN (systolic high today), ESRD on dialysis, carotid stenosis on Plavix and ASA.  Plan:   Problem List Items Addressed This Visit      Genitourinary   Malignant neoplasm of endocervix [facility]) - Primary     She has follow up planned with Dr Christel Mormon in 9/22.    BP high today because threw up BP meds this AM on empty stomach.  Will take these again later.   RTC in 4 months for follow up exam in [place] and 6 months in [place].  The patient's diagnosis, an outline of the further diagnostic and laboratory studies which will be required, the recommendation, and alternatives were discussed.  All questions were answered to the patient's satisfaction.  Verlon Au, NP  I personally  interviewed and examined the patient. Agreed with the above/below plan of care. I have directly contributed to assessment and plan of care of this patient and educated and discussed with patient and family.  Mellody Drown, MD

## 2021-01-30 LAB — IGP, APTIMA HPV: HPV Aptima: NEGATIVE

## 2021-02-01 ENCOUNTER — Telehealth: Payer: Self-pay

## 2021-02-01 NOTE — Telephone Encounter (Signed)
Called results of pap smear to Tracy Jennings. NILM/HPV negative.

## 2021-03-04 ENCOUNTER — Inpatient Hospital Stay: Payer: Medicare Other | Attending: Radiation Oncology

## 2021-03-09 ENCOUNTER — Emergency Department
Admission: EM | Admit: 2021-03-09 | Discharge: 2021-03-09 | Disposition: A | Discharge status: Home / Self Care | Payer: Medicare Other | Attending: Emergency Medicine | Admitting: Emergency Medicine

## 2021-03-09 ENCOUNTER — Other Ambulatory Visit: Payer: Self-pay

## 2021-03-09 ENCOUNTER — Encounter: Payer: Self-pay | Admitting: Oncology

## 2021-03-09 DIAGNOSIS — T82590A Other mechanical complication of surgically created arteriovenous fistula, initial encounter: Secondary | ICD-10-CM | POA: Diagnosis present

## 2021-03-09 DIAGNOSIS — Z79899 Other long term (current) drug therapy: Secondary | ICD-10-CM | POA: Insufficient documentation

## 2021-03-09 DIAGNOSIS — Y733 Surgical instruments, materials and gastroenterology and urology devices (including sutures) associated with adverse incidents: Secondary | ICD-10-CM | POA: Diagnosis not present

## 2021-03-09 DIAGNOSIS — Z992 Dependence on renal dialysis: Secondary | ICD-10-CM | POA: Diagnosis not present

## 2021-03-09 DIAGNOSIS — T829XXA Unspecified complication of cardiac and vascular prosthetic device, implant and graft, initial encounter: Secondary | ICD-10-CM

## 2021-03-09 DIAGNOSIS — N186 End stage renal disease: Secondary | ICD-10-CM | POA: Insufficient documentation

## 2021-03-09 DIAGNOSIS — Z8541 Personal history of malignant neoplasm of cervix uteri: Secondary | ICD-10-CM | POA: Insufficient documentation

## 2021-03-09 DIAGNOSIS — D631 Anemia in chronic kidney disease: Secondary | ICD-10-CM | POA: Insufficient documentation

## 2021-03-09 DIAGNOSIS — I120 Hypertensive chronic kidney disease with stage 5 chronic kidney disease or end stage renal disease: Secondary | ICD-10-CM | POA: Diagnosis not present

## 2021-03-09 DIAGNOSIS — Z8616 Personal history of COVID-19: Secondary | ICD-10-CM | POA: Insufficient documentation

## 2021-03-09 NOTE — ED Provider Notes (Signed)
[facility] Emergency Department Provider Note  ____________________________________________  Time seen: Approximately 7:40 PM  I have reviewed the triage vital signs and the nursing notes.   HISTORY  Chief Complaint Vascular Access Problem    HPI Tracy Jennings is a 65 y.o. female who presents the emergency department for evaluation of bleeding from her dialysis graft.  Patient states that she has been using a port up to today for her dialysis.  Patient states that they had accessed the fistula with 2 needles today.  She got home and it started bleeding.  She put direct pressure on it and called EMS.  Upon EMSs arrival there was no active bleeding.  They put a dressing over the area and I evaluated the patient roughly 2-1/2 hours after the initial bleeding.  There is no blood on the dressing.  No return of bleeding.  No other complaints at this time.  Medical history as described below.       Past Medical History:  Diagnosis Date   Anemia 04/2018   low iron. to be started on supplements   Cervical cancer ([place])    CKD (chronic kidney disease)    Stage IV   Complication of anesthesia    receceived too much anesthesia, that she was in coma for a couple days    COVID-19 virus detected 10/28/2019   Diabetes mellitus without complication ([place])    type II   ESRD (end stage renal disease) ([place])    Heart murmur    followed as a child only   HSIL (high grade squamous intraepithelial lesion) on Pap smear of cervix    Hyperlipidemia associated with type 2 diabetes mellitus ([place])    Hypertension    Pancreatitis    Peripheral vascular disease ([place])     Patient Active Problem List   Diagnosis Date Noted   Failure to thrive in adult    Pressure injury of skin 06/27/2020   Thrush    Anemia of chronic disease    Palliative care by specialist    Generalized abdominal pain    Acute blood loss anemia    Intractable vomiting 06/21/2020   Hypokalemia 06/21/2020    Hyponatremia 06/21/2020   Hypomagnesemia 06/21/2020   Intractable nausea and vomiting 06/21/2020   Nausea vomiting and diarrhea 06/21/2020   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) 53/97/6734   Complication of vascular access for dialysis 05/11/2018   Respiratory arrest ([place]) 05/11/2018   Hyperlipidemia 04/12/2018   Pancreatitis, recurrent 11/01/2017   Essential hypertension 11/01/2017   ESRD (end stage renal disease) ([place]) 11/01/2017   Recurrent pancreatitis 11/01/2017    Past Surgical History:  Procedure Laterality Date   AMPUTATION TOE Left 2013   2nd toe. tip of toe (toe nail was infected)   AV FISTULA PLACEMENT Left 05/11/2018   Procedure: ARTERIOVENOUS (AV) FISTULA CREATION;  Surgeon: Katha Cabal, MD;  Location: [facility] ORS;  Service: Vascular;  Laterality: Left;   CATARACT  EXTRACTION     CERVICAL CONIZATION W/BX N/A 04/08/2020   Procedure: CONIZATION CERVIX WITH BIOPSY;  Surgeon: Mellody Drown, MD;  Location: [facility] ORS;  Service: Gynecology;  Laterality: N/A;   CHOLECYSTECTOMY  2014   COLONOSCOPY     COLONOSCOPY WITH PROPOFOL N/A 01/10/2018   Procedure: COLONOSCOPY WITH PROPOFOL;  Surgeon: Toledo, Benay Pike, MD;  Location: [facility] ENDOSCOPY;  Service: Gastroenterology;  Laterality: N/A;   DIALYSIS/PERMA CATHETER INSERTION N/A 05/21/2018   Procedure: DIALYSIS/PERMA CATHETER INSERTION;  Surgeon: Algernon Huxley, MD;   Location: [facility];  Service: Cardiovascular;  Laterality: N/A;   DIALYSIS/PERMA CATHETER INSERTION N/A 07/14/2020   Procedure: DIALYSIS/PERMA CATHETER INSERTION;  Surgeon: Algernon Huxley, MD;  Location: [facility];  Service: Cardiovascular;  Laterality: N/A;   DIALYSIS/PERMA CATHETER REMOVAL N/A 04/11/2019   Procedure: DIALYSIS/PERMA CATHETER REMOVAL;  Surgeon: Algernon Huxley, MD;  Location: [facility];  Service: Cardiovascular;  Laterality: N/A;   EYE SURGERY Bilateral 2018   cataract extractions   IR IMAGING GUIDED PORT INSERTION  06/05/2020   THROMBECTOMY W/ EMBOLECTOMY  05/11/2018   Procedure: THROMBECTOMY ARTERIOVENOUS FISTULA;  Surgeon: Katha Cabal, MD;  Location: [facility] ORS;  Service: Vascular;;   TUBAL LIGATION  1984    Prior to Admission medications   Medication Sig Start Date End Date Taking? Authorizing Provider  amitriptyline (ELAVIL) 10 MG tablet daily as needed. 11/23/20   [provider]  amLODipine (NORVASC) 10 MG tablet Take by mouth. 12/01/20   [provider]  calcitRIOL (ROCALTROL) 0.25 MCG capsule Take 0.25 mcg by mouth daily. 09/26/19   [provider]  calcium acetate (PHOSLO) 667 MG capsule Take 1,334 mg by mouth 2 (two) times daily with a meal.  08/20/19   [provider]  carvedilol (COREG CR) 40 MG 24 hr capsule Take by mouth. 02/26/19   [provider]  carvedilol (COREG) 25 MG tablet Take 25 mg by mouth 2 (two) times daily with a meal.     [provider]  cloNIDine (CATAPRES - DOSED IN MG/24 HR) 0.1 mg/24hr patch Place 1 patch (0.1 mg total) onto the skin once a week. 07/01/20   Fritzi Mandes, MD  clopidogrel (PLAVIX) 75 MG tablet Take 75 mg by mouth daily.     [provider]  DILT-XR 180 MG 24 hr capsule Take 180 mg by mouth daily.  05/01/20   [provider]  diphenoxylate-atropine (LOMOTIL) 2.5-0.025 MG tablet Take 1 tablet by mouth 4 (four) times daily as needed for  diarrhea or loose stools. 06/08/20   Lloyd Huger, MD  fluconazole (DIFLUCAN) 100 MG tablet Take 1 tablet (100 mg total) by mouth daily. 07/02/20   Fritzi Mandes, MD  lidocaine-prilocaine (EMLA) cream Apply 1 application topically as needed. Apply to port 1-2 hours prior to chemotherapy, cover with plastic wrap. 06/05/20   Lloyd Huger, MD  multivitamin (RENA-VIT) TABS tablet Take 1 tablet by mouth daily before breakfast. 07/02/20   Fritzi Mandes, MD  Nutritional Supplements (FEEDING SUPPLEMENT, NEPRO CARB STEADY,) LIQD Take 237 mLs by mouth 2 (two) times daily between meals. 07/02/20   Fritzi Mandes, MD  ondansetron (ZOFRAN) 4 MG tablet Take 4 mg by mouth 2 (two) times daily.  06/10/20   [provider]  Potassium Chloride ER 20 MEQ TBCR Take 1 tablet by mouth daily.  05/12/20   [provider]  prochlorperazine (COMPAZINE) 10 MG tablet Take 1 tablet (10 mg total) by mouth  every 6 (six) hours as needed (Nausea or vomiting). 03/20/20   Lloyd Huger, MD  diltiazem (CARDIZEM CD) 120 MG 24 hr capsule Take 120 mg by mouth daily. 09/26/19 10/29/19  [provider]    Allergies Amlodipine, Hctz [hydrochlorothiazide], Heparin, Lmw heparin, Other, Sulfa antibiotics, Dairy aid [lactase], and Hydralazine  Family History  Problem Relation Age of Onset   Stroke Mother    Hypertension Mother    Gout Mother    Cancer Father    Diabetes Sister    Diabetes Maternal Grandmother    Diabetes Maternal Grandfather    Diabetes Paternal Grandmother    Diabetes Paternal Grandfather     Social History Social History   Tobacco Use   Smoking status: Never   Smokeless tobacco: Never  Vaping Use   Vaping Use: Never used  Substance Use Topics   Alcohol use: No   Drug use: Never     Review of Systems  Constitutional: No fever/chills Eyes: No visual changes. No discharge ENT: No upper respiratory complaints. Cardiovascular: no chest pain.  Bleeding from dialysis  fistula Respiratory: no cough. No SOB. Gastrointestinal: No abdominal pain.  No nausea, no vomiting.  No diarrhea.  No constipation. Musculoskeletal: Negative for musculoskeletal pain. Skin: Negative for rash, abrasions, lacerations, ecchymosis. Neurological: Negative for headaches, focal weakness or numbness.  10 System ROS otherwise negative.  ____________________________________________   PHYSICAL EXAM:  VITAL SIGNS: ED Triage Vitals  Enc Vitals Group     BP 03/09/21 1747 (!) 192/69     Pulse Rate 03/09/21 1747 72     Resp 03/09/21 1746 20     Temp 03/09/21 1746 98.2 F (36.8 C)     Temp Source 03/09/21 1746 Oral     SpO2 03/09/21 1747 100 %     Weight 03/09/21 1746 139 lb (63 kg)     Height 03/09/21 1746 5\' 3"  (1.6 m)     Head Circumference --      Peak Flow --      Pain Score 03/09/21 1746 0     Pain Loc --      Pain Edu? --      Excl. in [place]? --      Constitutional: Alert and oriented. Well appearing and in no acute distress. Eyes: Conjunctivae are normal. PERRL. EOMI. Head: Atraumatic. ENT:      Ears:       Nose: No congestion/rhinnorhea.      Mouth/Throat: Mucous membranes are moist.  Neck: No stridor.    Cardiovascular: Normal rate, regular rhythm. Normal S1 and S2.  Good peripheral circulation. Respiratory: Normal respiratory effort without tachypnea or retractions. Lungs CTAB. Good air entry to the bases with no decreased or absent breath sounds. Musculoskeletal: Full range of motion to all extremities. No gross deformities appreciated.  Visualization of the left upper extremity reveals evidence consistent with dialysis fistula.  He can see access from today with needle mark, however there is no active bleeding.  There is no evidence of bleeding on the patient's dressing.  She does have blood on her shirt from when this first began.  However at this time there is no bleeding and patient has not had any resumption of bleeding in the last 2-1/2 hours.  Pulses  intact distally.  Sensation intact distally. Neurologic:  Normal speech and language. No gross focal neurologic deficits are appreciated.  Skin:  Skin is warm, dry and intact. No rash noted. Psychiatric: Mood and affect are normal. Speech and  behavior are normal. Patient exhibits appropriate insight and judgement.   ____________________________________________   LABS (all labs ordered are listed, but only abnormal results are displayed)  Labs Reviewed - No data to display ____________________________________________  EKG   ____________________________________________  RADIOLOGY   No results found.  ____________________________________________    PROCEDURES  Procedure(s) performed:    Procedures    Medications - No data to display   ____________________________________________   INITIAL IMPRESSION / ASSESSMENT AND PLAN / ED COURSE  Pertinent labs & imaging results that were available during my care of the patient were reviewed by me and considered in my medical decision making (see chart for details).  Review of the [place] CSRS was performed in accordance of the [place] prior to dispensing any controlled drugs.           Patient's diagnosis is consistent with complication of dialysis fistula.  Patient had bleeding from her dialysis site today.  This was the first time it been accessed and she states that the access to needles.  Patient had some bleeding at home but applied direct pressure and on EMS arrival bleeding had stopped.  They placed a fresh dressing on the patient's arm and there is no blood on the dressing when I removed.  Patient had no return of bleeding.  At this time there is no evidence of hematoma or ecchymosis surrounding this access point.  I placed another dressing over the area and feel that patient is stable for discharge at this time.  Return precautions discussed with the patient.  I advised the patient to inform the dialysis center of this  occurrence on at her next appointment..  No medications, treatments or patient is given ED precautions to return to the ED for any worsening or new symptoms.     ____________________________________________  FINAL CLINICAL IMPRESSION(S) / ED DIAGNOSES  Final diagnoses:  Complication of arteriovenous dialysis fistula, initial encounter      NEW MEDICATIONS STARTED DURING THIS VISIT:  ED Discharge Orders     None           This chart was dictated using voice recognition software/Dragon. Despite best efforts to proofread, errors can occur which can change the meaning. Any change was purely unintentional.    Darletta Moll, PA-C 03/09/21 1944    Vladimir Crofts, MD 03/09/21 2318

## 2021-03-09 NOTE — ED Triage Notes (Signed)
Pt to ED [facility] from home for fistula to right arm bleeding after dialysis today. EMS reports was not bleeding on arrival. No bleeding at this time.  Pt alert and oriented.  Hx HTN, has not taken meds yet

## 2021-04-22 ENCOUNTER — Inpatient Hospital Stay: Payer: BC Managed Care – PPO | Attending: Oncology

## 2021-05-27 ENCOUNTER — Emergency Department
Admission: EM | Admit: 2021-05-27 | Discharge: 2021-05-27 | Disposition: A | Discharge status: Home / Self Care | Payer: Medicare Other | Attending: Emergency Medicine | Admitting: Emergency Medicine

## 2021-05-27 ENCOUNTER — Other Ambulatory Visit: Payer: Self-pay

## 2021-05-27 ENCOUNTER — Encounter: Payer: Self-pay | Admitting: Emergency Medicine

## 2021-05-27 DIAGNOSIS — Z5321 Procedure and treatment not carried out due to patient leaving prior to being seen by health care provider: Secondary | ICD-10-CM | POA: Diagnosis not present

## 2021-05-27 DIAGNOSIS — I10 Essential (primary) hypertension: Secondary | ICD-10-CM | POA: Insufficient documentation

## 2021-05-27 DIAGNOSIS — R030 Elevated blood-pressure reading, without diagnosis of hypertension: Secondary | ICD-10-CM | POA: Diagnosis present

## 2021-05-27 LAB — BASIC METABOLIC PANEL
Anion gap: 17 — ABNORMAL HIGH (ref 5–15)
BUN: 62 mg/dL — ABNORMAL HIGH (ref 8–23)
CO2: 21 mmol/L — ABNORMAL LOW (ref 22–32)
Calcium: 8.5 mg/dL — ABNORMAL LOW (ref 8.9–10.3)
Chloride: 97 mmol/L — ABNORMAL LOW (ref 98–111)
Creatinine, Ser: 7.4 mg/dL — ABNORMAL HIGH (ref 0.44–1.00)
GFR, Estimated: 6 mL/min — ABNORMAL LOW (ref >60)
Glucose, Bld: 86 mg/dL (ref 70–99)
Potassium: 4.1 mmol/L (ref 3.5–5.1)
Sodium: 135 mmol/L (ref 135–145)

## 2021-05-27 LAB — TROPONIN I (HIGH SENSITIVITY): Troponin I (High Sensitivity): 56 ng/L — ABNORMAL HIGH (ref <18)

## 2021-05-27 LAB — CBC
HCT: 45 % (ref 36.0–46.0)
Hemoglobin: 14.3 g/dL (ref 12.0–15.0)
MCH: 30 pg (ref 26.0–34.0)
MCHC: 31.8 g/dL (ref 30.0–36.0)
MCV: 94.3 fL (ref 80.0–100.0)
Platelets: 209 10*3/uL (ref 150–400)
RBC: 4.77 MIL/uL (ref 3.87–5.11)
RDW: 17 % — ABNORMAL HIGH (ref 11.5–15.5)
WBC: 4.3 10*3/uL (ref 4.0–10.5)
nRBC: 0 % (ref 0.0–0.2)

## 2021-05-27 NOTE — ED Triage Notes (Signed)
Pt comes into the ED via [facility] from dialysis center for HTN.  Pt had an episode of "staring off in space" per the center.  PT had 2500 taken off at dialysis.  No complaints by patient other than HTN and headache.  Pt denies any CP, SHOB, or dizziness.

## 2021-05-27 NOTE — ED Triage Notes (Signed)
Pt brought in by [facility] she had a dialysis treatment, staff seen her stare off in space and she had hypertension. Took off 2500 fluid. No complaints.   EMS states that she  was 239/105

## 2021-06-02 ENCOUNTER — Telehealth: Payer: Self-pay | Admitting: Emergency Medicine

## 2021-06-02 NOTE — Telephone Encounter (Signed)
Called patient due to left emergency department before provider exam to inquire about condition and follow up plans. Says she has seen her doctor at [facility] and they have adjusted her meds.

## 2021-06-06 IMAGING — CT CT ABD-PELV W/O CM
2 of 4 series · 16 of 46 positions shown, 18 images · non-contrast
Comparison: PET 03/13/2020

CLINICAL DATA: Pain. Diarrhea for 1 month. End-stage renal disease
with peritoneal dialysis. Cervical cancer undergoing chemotherapy
and radiation therapy.

EXAM:
CT ABDOMEN AND PELVIS WITHOUT CONTRAST
TECHNIQUE: Multidetector CT imaging of the abdomen and pelvis was performed
following the standard protocol without IV contrast.

[Series 2: routine abd/pel wo · axial · 0.79mm/px · z∈[-1024,-594]mm · 13 of 94 slices shown, 15 images]
[im 4/94  soft-tissue]
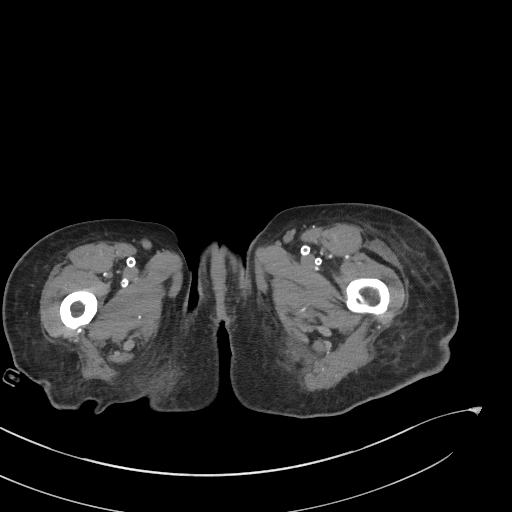
[im 4/94  bone]
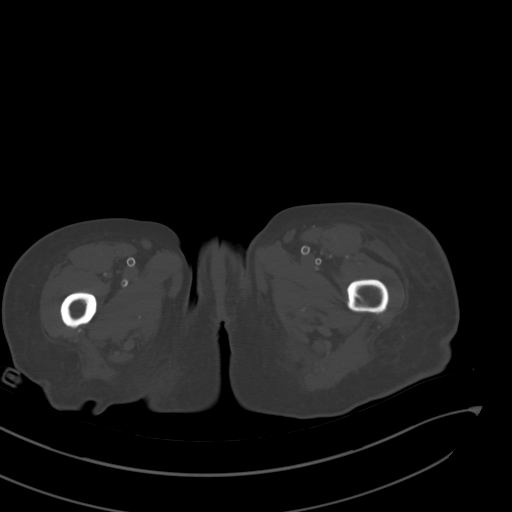
[im 11/94  soft-tissue]
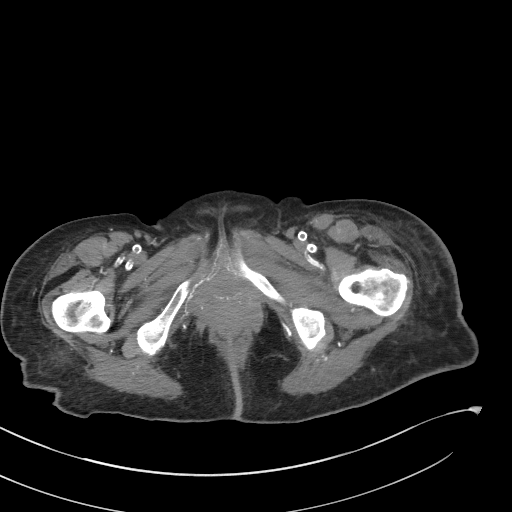
[im 18/94  soft-tissue]
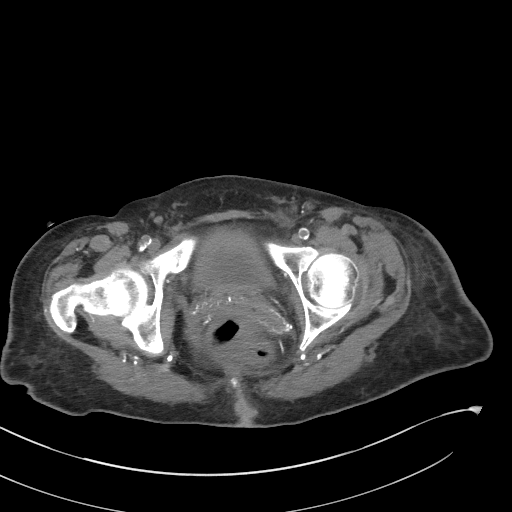
[im 26/94  soft-tissue]
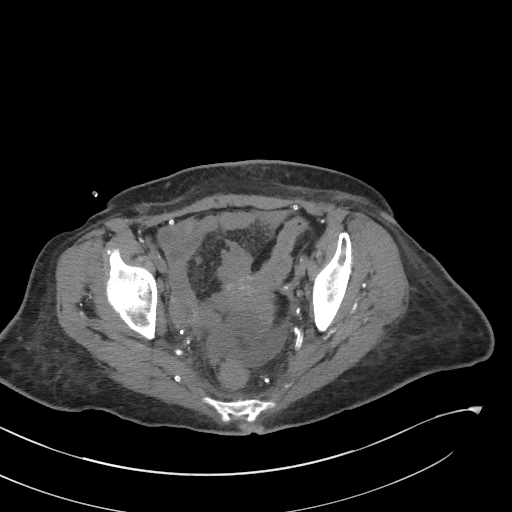
[im 33/94  soft-tissue]
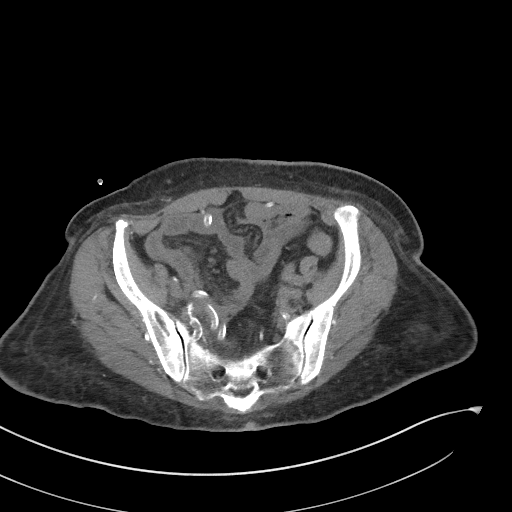
[im 40/94  soft-tissue]
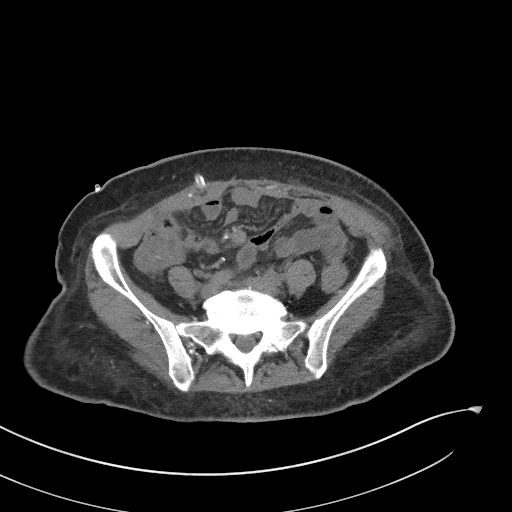
[im 47/94  soft-tissue]
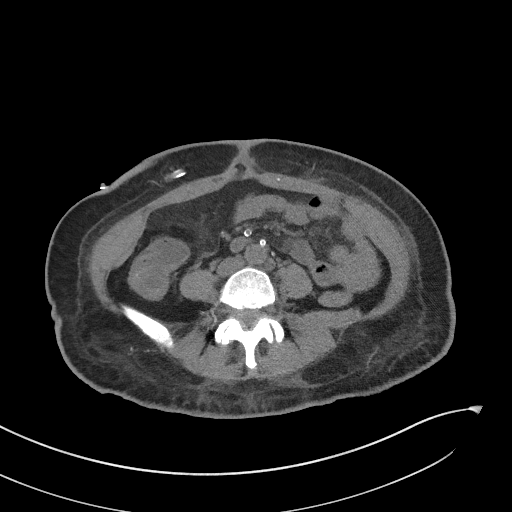
[im 54/94  soft-tissue]
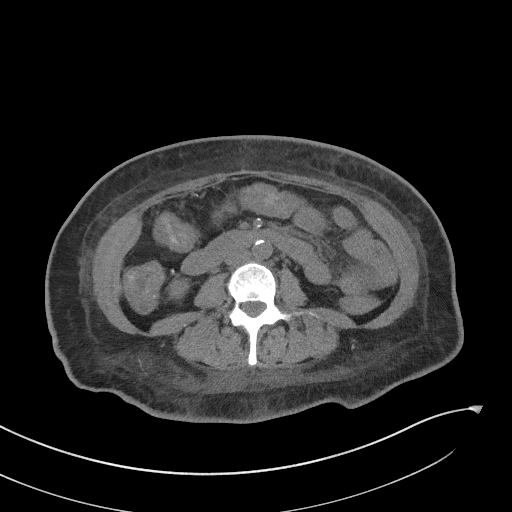
[im 61/94  soft-tissue]
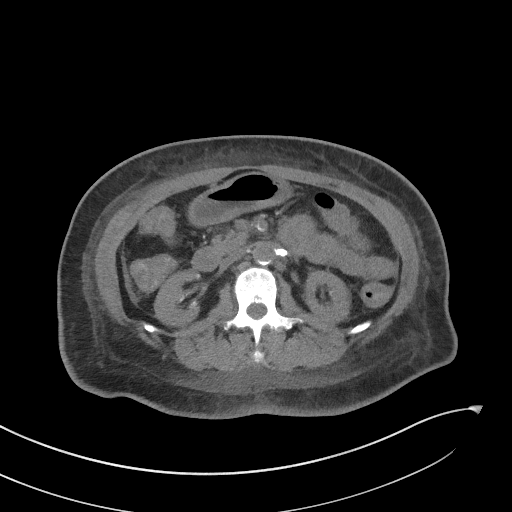
[im 61/94  bone]
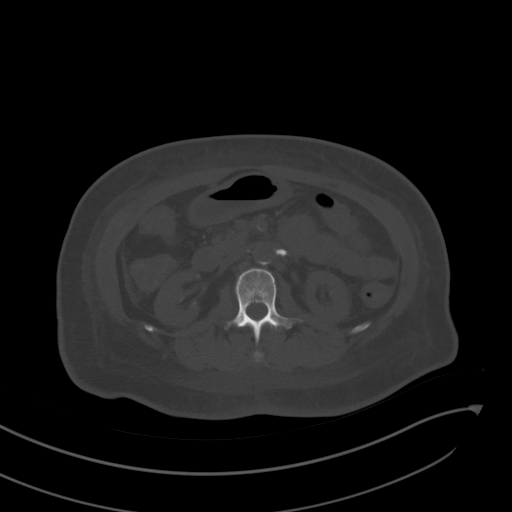
[im 68/94  soft-tissue]
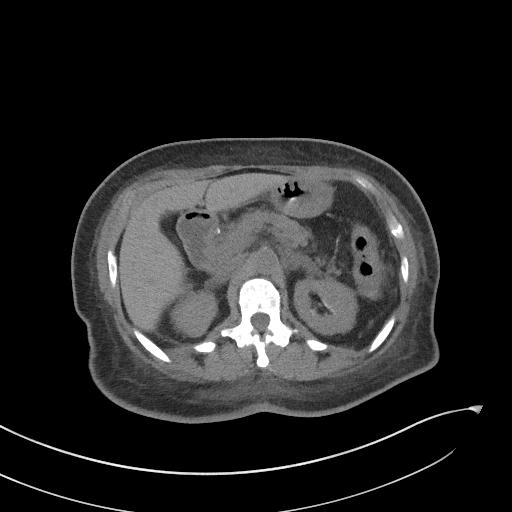
[im 76/94  soft-tissue]
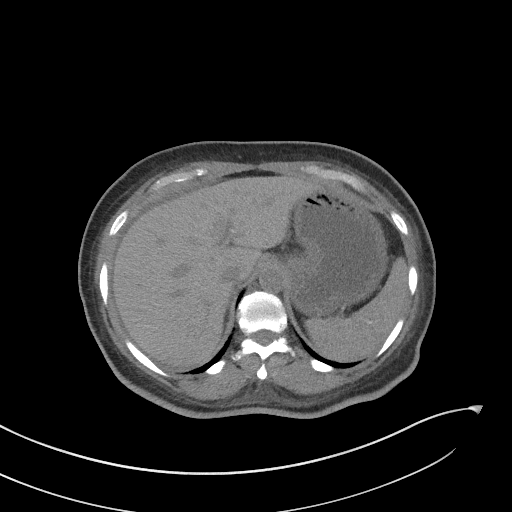
[im 83/94  soft-tissue]
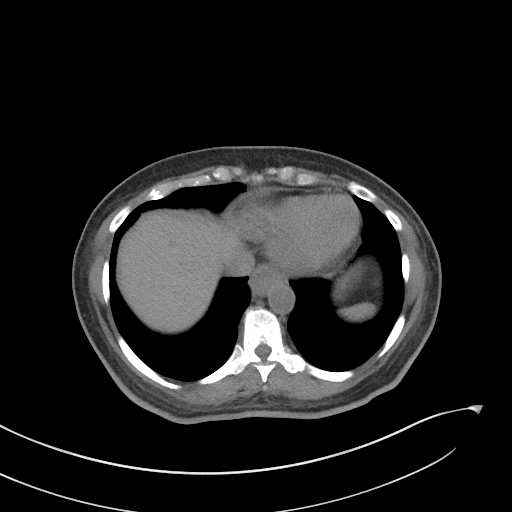
[im 90/94  soft-tissue]
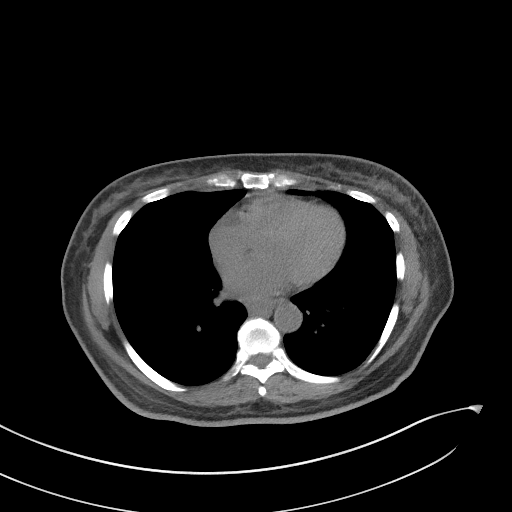

[Series 5: coronal st · coronal · 0.79mm/px · 3 of 80 slices shown]
[im 27/80  soft-tissue]
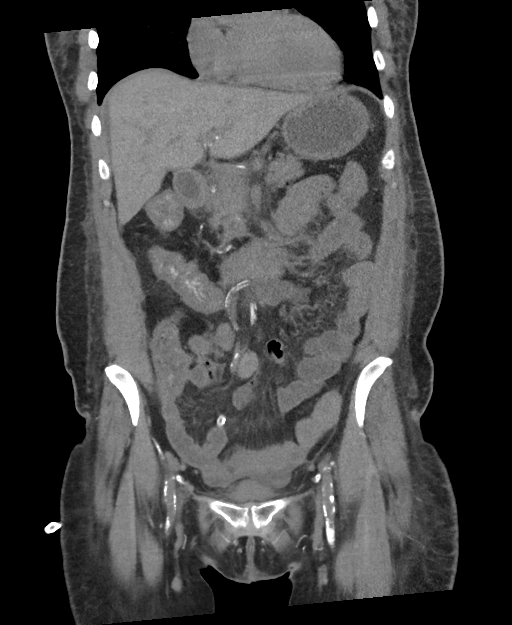
[im 36/80  soft-tissue]
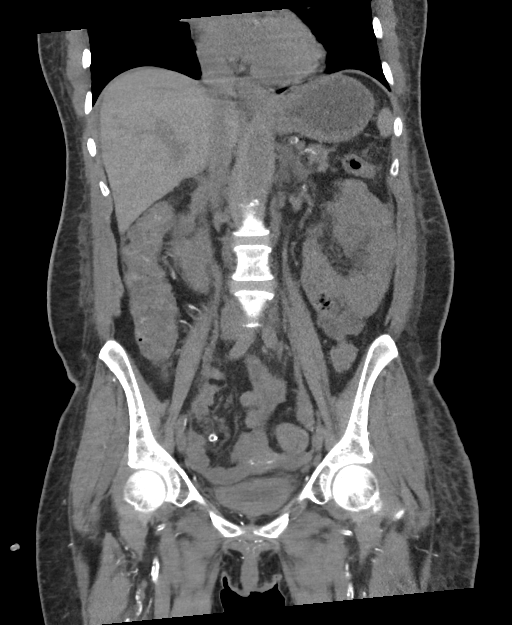
[im 44/80  soft-tissue]
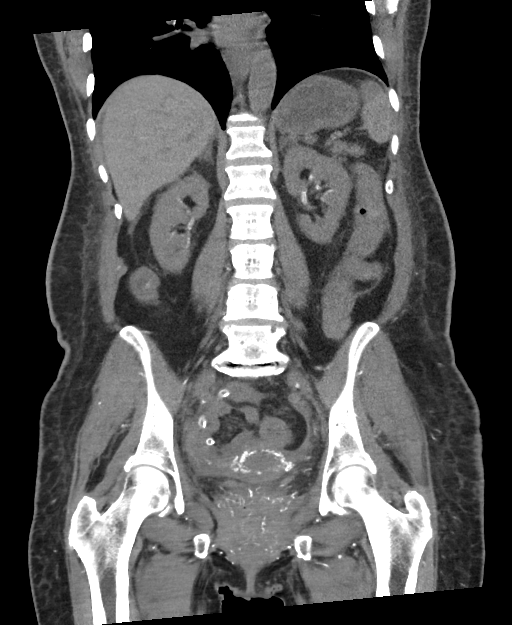

[16 of 46 positions shown; findings below may reference images not displayed]

FINDINGS: Lower chest: Clear lung bases. Normal heart size without pericardial
or pleural effusion. Lad coronary artery calcification.

Hepatobiliary: Normal liver. Cholecystectomy, without biliary ductal
dilatation.

Pancreas: Normal, without mass or ductal dilatation.

Spleen: Normal in size, new hypoattenuation within the superior
anterior spleen on [DATE].

Adrenals/Urinary Tract: Left greater than right adrenal enlargement.
The right adrenal is somewhat nodular but maintains its adreniform
shape, similar. The left adrenal measures maximally 2.6 x 1.7 cm and
low-density, favoring an adenoma.

Bilateral renal vascular calcifications. No hydronephrosis.
Decompressed urinary bladder. Subtle pericystic edema.

Stomach/Bowel: Normal stomach, without wall thickening. The colon is
relatively diffusely decompressed, but thick walled. Most apparent
within the transverse and ascending segments on 41/2. Normal
terminal ileum. Appendix favored to be identified on 58/2, without
evidence of appendicitis.

Normal small bowel.

Vascular/Lymphatic: Advanced aortic and branch vessel
atherosclerosis. No abdominopelvic adenopathy.

Reproductive: Normal uterus. No adnexal mass. Suspect a tampon in
place.

Other: Decrease in trace abdominopelvic fluid. Peritoneal dialysis
catheter terminating in the right hemipelvis. No free
intraperitoneal air. Diffuse anasarca.

Musculoskeletal: Lumbosacral spondylosis with trace L4-5
anterolisthesis.
IMPRESSION: 1. Relatively decompressed, but thick walled colon, consistent with
colitis. Favor infection.
2. Decrease in trace abdominopelvic fluid with peritoneal dialysis
catheter in place.
3. New hypoattenuation within the superior anterior spleen,
suspicious for infarct.
4. Subtle pericystic edema, possibly radiation induced. Recommend
clinical exclusion of infectious cystitis. At
5. Coronary artery atherosclerosis. Aortic Atherosclerosis
([id number]).
6. Left adrenal nodule, favored to represent an adenoma.

## 2021-06-09 ENCOUNTER — Inpatient Hospital Stay: Payer: Medicare Other | Attending: Radiation Oncology

## 2021-06-17 ENCOUNTER — Telehealth: Payer: Self-pay

## 2021-06-17 NOTE — Telephone Encounter (Signed)
Received message from Dr. Boneta Lucks office that she was seen 10/4 and we can move our appointment out 3 months. Called and left Tracy Jennings her new appointment on her voicemail. She has also missed multiple port flushes and these need to be reschedule. I have asked her to call regarding setting these up.

## 2021-06-22 ENCOUNTER — Telehealth: Payer: Self-pay

## 2021-06-22 NOTE — Telephone Encounter (Signed)
Second voicemail left with Tracy Jennings to return call to schedule port flushes.

## 2021-07-09 ENCOUNTER — Telehealth: Payer: Self-pay

## 2021-07-09 NOTE — Telephone Encounter (Signed)
Third call placed to [name] to arrange port flushes. No answer and voicemail is full. Attempted to call spouse, Tracy Jennings. His voicemail is full. Will mail gyn oncology appointment and add instructions to call and schedule port flushes.

## 2021-07-30 ENCOUNTER — Other Ambulatory Visit: Payer: Self-pay

## 2021-07-30 ENCOUNTER — Inpatient Hospital Stay: Payer: Medicare Other | Attending: Oncology

## 2021-07-30 DIAGNOSIS — Z95828 Presence of other vascular implants and grafts: Secondary | ICD-10-CM

## 2021-07-30 DIAGNOSIS — C530 Malignant neoplasm of endocervix: Secondary | ICD-10-CM | POA: Insufficient documentation

## 2021-07-30 DIAGNOSIS — Z452 Encounter for adjustment and management of vascular access device: Secondary | ICD-10-CM | POA: Diagnosis not present

## 2021-07-30 MED ORDER — SODIUM CHLORIDE 0.9% FLUSH
10.0000 mL | Freq: Once | INTRAVENOUS | Status: AC
  Administered 2021-07-30: 10 mL via INTRAVENOUS
  Filled 2021-07-30: qty 10

## 2021-07-30 MED ORDER — HEPARIN SOD (PORK) LOCK FLUSH 100 UNIT/ML IV SOLN
500.0000 [IU] | Freq: Once | INTRAVENOUS | Status: AC
  Administered 2021-07-30: 500 [IU] via INTRAVENOUS
  Filled 2021-07-30: qty 5

## 2021-08-04 ENCOUNTER — Inpatient Hospital Stay: Payer: Medicare Other

## 2021-08-04 ENCOUNTER — Ambulatory Visit: Payer: Medicare Other

## 2021-09-15 ENCOUNTER — Other Ambulatory Visit: Payer: Self-pay

## 2021-09-15 ENCOUNTER — Inpatient Hospital Stay: Payer: Medicare Other | Attending: Obstetrics and Gynecology | Admitting: Obstetrics and Gynecology

## 2021-09-15 ENCOUNTER — Inpatient Hospital Stay: Payer: Medicare Other

## 2021-09-15 ENCOUNTER — Encounter: Payer: Self-pay | Admitting: Nurse Practitioner

## 2021-09-15 VITALS — BP 158/66 | HR 70 | Temp 97.8°F | Resp 20 | Wt 133.9 lb

## 2021-09-15 DIAGNOSIS — D631 Anemia in chronic kidney disease: Secondary | ICD-10-CM | POA: Diagnosis not present

## 2021-09-15 DIAGNOSIS — N186 End stage renal disease: Secondary | ICD-10-CM | POA: Insufficient documentation

## 2021-09-15 DIAGNOSIS — Z08 Encounter for follow-up examination after completed treatment for malignant neoplasm: Secondary | ICD-10-CM

## 2021-09-15 DIAGNOSIS — R197 Diarrhea, unspecified: Secondary | ICD-10-CM | POA: Insufficient documentation

## 2021-09-15 DIAGNOSIS — Z992 Dependence on renal dialysis: Secondary | ICD-10-CM | POA: Insufficient documentation

## 2021-09-15 DIAGNOSIS — Z95828 Presence of other vascular implants and grafts: Secondary | ICD-10-CM

## 2021-09-15 DIAGNOSIS — C539 Malignant neoplasm of cervix uteri, unspecified: Secondary | ICD-10-CM | POA: Insufficient documentation

## 2021-09-15 DIAGNOSIS — Z8541 Personal history of malignant neoplasm of cervix uteri: Secondary | ICD-10-CM

## 2021-09-15 MED ORDER — SODIUM CHLORIDE 0.9% FLUSH
10.0000 mL | Freq: Once | INTRAVENOUS | Status: AC
  Administered 2021-09-15: 10 mL via INTRAVENOUS
  Filled 2021-09-15: qty 10

## 2021-09-15 NOTE — Progress Notes (Signed)
Gynecologic Oncology Consult Note [facility]  [phone] [phone]  Patient Care Team: Sharyne Peach, MD as PCP - General (Family Medicine) Wellington Hampshire, MD as PCP - Cardiology (Cardiology) Noreene Filbert, MD as Radiation Oncologist (Radiation Oncology) Clent Jacks, RN as Oncology Nurse Navigator   Name of the patient: Tracy Jennings  [id number]  07/07/1956   Date of visit: 09/15/21  Referring Provider: Dr Leonides Schanz  Chief Concern: Cervical adenocarcinoma  Subjective:  Tracy Jennings is a 66 y.o. female with ESRD on dialysis who is seen in consultation from Dr Leonides Schanz for adenocarcinoma of the cervix diagnosed 6/21 s/p 3 cycles of cisplatin with pelvic radiation followed by brachytherapy who returns to clinic for surveillance.   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  She completed 3 cycles of cisplatin and pelvic radiation on 10/21 followed by vaginal brachytherapy with Dr. Christel Mormon at [facility] from 07/10/2020-08/14/2020.  4 total fractions.  Difficulty with scheduling due to transportation and conflict with her dialysis schedule.  She continues hemodialysis 3 times a week.  She saw Dr. Christel Mormon at [facility] on 06/15/21.   History of ESRD, previously considered for kidney transplant, more recently note states, appears patient has 2-3 year wait following completion of treatment before she could consider her for listing. Case was closed and recommended she continue treatment with current dialysis unit, [facility].    She reports feeling well and denies complaints. Just some diarrhea since radiation.   Gynecology Oncology History PAP: Atypical squamous cells cannot exclude HSIL (ASC-H)  Seen by Dr Larey Days 03/10/20 On exam: Vagina normal, well-estrogenized vaginal mucosa, no evidence of vaginal neoplasia, Cervix: No overt carcinoma, well-visualized   Colposcopic exam: TZ not seen entirely, AWE not seen. No lesions seen.  ECC was done, random cervical biopsies were taken Pathology 10:00 - suspicious for adenocarcinoma, 6:00 bx and ECC - adenocarcinoma  She was initially seen on 03/11/2020 in the [facility] and cervix looked and felt normal.  PET/CT to evaluate origin of the cancer (uterine vs. cervical origin) and assess for extent of disease.  PET scan 03/13/20 shows some low level FDG activity in the cervix (none in uterus), but no prominent cervical mass.  Some PET activity in pelvic nodes, but not above blood pool and no other primary or metastatic disease noted.  Initial plan was to proceed with chemoradiation for cervical cancer.  However, without any knowledge of the size or depth of invasion of the cancer in the cervix based on exam and imaging it was decided that a cone biopsy and D&C was needed to confirm that there is more than 3 mm of invasive cancer in the cervix and that there was no endometrial primary.  If cancer was confined to the cervix and invasion and < 66m invasion, this could be sufficiently treated with a large cone biopsy with negative margins, and it would be better to avoid the potential toxicity of chemoradiation in view of her existing vascular disease and dialysis.  If she is proven to have a cervical cancer with > 3 mm of invasion or LVSI would proceed with chemoradiation.   History significant for significant vascular disease with end stage renal disease, on peritoneal dialysis and carotid stenosis. She is [facility] for thrombosis prophylaxis. She had a thrombosis of her AVF fistula in 2019 after this was placed  and had cardiac arrest post thrombectomy.  She was seen by cardiology on 03/30/2020 and felt to be doing well from cardiac perspective and given clearance for surgery. Echo in 10/2019 revealed EF 60-65%. EKG 03/30/20 demonstrated NSR, 77 bpm, rare PVC, lVH, poor R wave progression  along precordial leads, no acute st/t changes. Blood pressure is therapeutically elevated due to history of orthostatic hypotension. She is felt to be low risk for noncardiac surgery. Vascular surgery recommended holding clopidogrel for 5 days prior to surgery and for 2 days post operatively. She continues peritoneal dialysis.   Cone biopsy 04/08/20  A. CERVIX, CONIZATION BIOPSY:  - INVASIVE ADENOCARCINOMA WITH INVOLVEMENT OF ENDOCERVICAL AND DEEP MARGINS.  B. ENDOCERVIX, CURETTAGE:  - SCANT DETACHED AND DEGENERATED CELLS, SUSPICIOUS FOR MALIGNANCY.  C. ENDOMETRIUM, CURETTAGE: - INVASIVE ADENOCARCINOMA.   Comment:  Sections demonstrate an infiltrative adenocarcinoma with areas of marked nuclear pleomorphism.  This high-grade adenocarcinoma shows diffuse  positive staining for p16.  Staining for p53 appears to demonstrate a null (mutated) pattern, which would rule out an HPV-associated endocervical adenocarcinoma and would favor the diagnosis of high-grade serous carcinoma.  However, there is not an internal positive control, and therefore the stain cannot be interpreted as null with complete certainty.  MMR stains were performed to possibly aid in diagnosing an endometrioid carcinoma, with no loss of MMR proteins.  The case has been sent for expert consultation and second opinion, with an addendum to follow.   ([facility] path review below) A. 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 The ectocervical margin appears uninvolved. No lymphovascular invasion is identified. Chromogenic in situ hybridization for high risk HPV E6/7 mRNA (types 16, 18, 26, 31, 33, 35, 39, 45, 51, 52, 53, 56, 58, 59, 66, 68, 73 and 82) is negative in the cells of interest.  Immunohistochemistry was performed on blocks A2 and A5 at [facility] to characterize the pathologic process and demonstrates the following immunophenotype in the cells of interest: POSITIVE:  HNF1B, AMACR, Napsin A, p16 (diffuse), Ki-67 elevated, NEGATIVE: ER, PR. This staining profile supports the above diagnosis.  B. Endocervix, curettage: Scant detached atypical cells with degenerative changes, suspicious for adenocarcinoma.  C. Endometrium, curettage: Clear cell adenocarcinoma, present in a minute fragment of stroma, see COMMENT. COMMENT: The sections demonstrate an infiltrative glandular proliferation of malignant cells with marked nuclear pleomorphism, undermining endocervical and ectocervical epithelium at the squamocolumnar junction. The lesion measures at least 8 mm in lateral extent and invades 1 mm of cervical stroma, but extension to the endocervical and deep margins precludes definitive measurement. Although the bulk of the lesion appears in the cone biopsy, an endometrial primary cannot be excluded.  In view of pathology report and PET scan believe this is most likely a primary cervical clear cell adenocarcinoma and she started chemoradiation 05/11/20. Exam at that time showed Necrotic debris over cone bed and this was removed with swab.  Granulation tissue underneath.  05/21/20-06/08/20- 3 cycles cisplatin 05/13/20-10/7-  EBRT w/ Dr. Baruch Gouty at [facility] 06/21/20 admitted to [facility] for nausea/vomiting/diarrhea due to colitis from radiation 07/06/20-08/13/20- HDR w/ Dr. Christel Mormon at [place] scan 10/21 IMPRESSION: 1. Relatively decompressed, but thick walled colon, consistent with colitis. Favor infection. 2. Decrease in trace abdominopelvic fluid with peritoneal dialysis catheter in place. 3. New hypoattenuation within the superior anterior spleen, suspicious for infarct. 4. Subtle pericystic edema, possibly radiation induced. Recommend clinical exclusion of infectious cystitis.  5. Coronary artery atherosclerosis. Aortic Atherosclerosis  6. Left adrenal nodule, favored to represent an adenoma.  Medical history Menopause age 62.  No bleeding since.  No pelvic pain.   UTI a month  ago and some vaginal discharge.   On peritoneal dialysis for about a year and stopped working. Takes iron shots for anemia.  Patient hospitalized three days for mild COVID19 pneumonia in February 2021 2/21 Carotid US 1. Bilateral carotid bifurcation plaque resulting in less than 50% diameter ICA stenosis. 2. Antegrade bilateral vertebral arterial flow. Cardiac Echo EF 60-65%  HIV 10/29/19- nonreactive  Oncology History  Cervical cancer ([place])  03/11/2020 Initial Diagnosis   Cervical cancer ([place])   05/21/2020 -  Chemotherapy   The patient had palonosetron (ALOXI) injection 0.25 mg, 0.25 mg, Intravenous,  Once, 3 of 7 cycles Administration: 0.25 mg (05/21/2020), 0.25 mg (05/28/2020), 0.25 mg (06/08/2020) CISplatin (PLATINOL) 35 mg in sodium chloride 0.9 % 250 mL chemo infusion, 20 mg/m2 = 35 mg (100 % of original dose 20 mg/m2), Intravenous,  Once, 3 of 7 cycles Dose modification: 20 mg/m2 (original dose 20 mg/m2, Cycle 1, Reason: Other (see comments)) Administration: 35 mg (05/21/2020), 35 mg (05/28/2020), 35 mg (06/08/2020) fosaprepitant (EMEND) 150 mg in sodium chloride 0.9 % 145 mL IVPB, 150 mg, Intravenous,  Once, 3 of 7 cycles Administration: 150 mg (05/21/2020), 150 mg (05/28/2020), 150 mg (06/08/2020)   for chemotherapy treatment.      Patient Active Problem List   Diagnosis Date Noted   Failure to thrive in adult    Pressure injury of skin 06/27/2020   Thrush    Anemia of chronic disease    Palliative care by specialist    Generalized abdominal pain    Acute blood loss anemia    Intractable vomiting 06/21/2020   Hypokalemia 06/21/2020   Hyponatremia 06/21/2020   Hypomagnesemia 06/21/2020   Intractable nausea and vomiting 06/21/2020   Nausea vomiting and diarrhea 06/21/2020   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[place]) 03/31/2020   Goals of care, counseling/discussion 03/20/2020   Cervical cancer ([place]) 03/11/2020   Orthostatic hypotension    Syncope 11/04/2019   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   Type 2 diabetes mellitus with other specified complication ([place]) 21/30/8657   ESRD on peritoneal dialysis ([place]) 11/03/2019   Unable to care for self 11/03/2019   Accelerated hypertension 84/69/6295   Metabolic acidosis, increased anion gap (IAG)    COVID-19    Generalized weakness    Recurrent syncope 10/28/2019   HIT (heparin-induced thrombocytopenia) 28/41/3244   Complication of vascular access for dialysis 05/11/2018   Respiratory arrest ([place]) 05/11/2018   Hyperlipidemia 04/12/2018   Pancreatitis, recurrent 11/01/2017   Essential hypertension 11/01/2017   ESRD (end stage renal disease) ([place]) 11/01/2017   Recurrent pancreatitis 11/01/2017   Past Medical History:  Diagnosis Date   Anemia 04/2018   low iron. to be started on supplements   Cervical cancer (HCC)    CKD (chronic kidney disease)    Stage IV   Complication of anesthesia    receceived too much anesthesia, that she was in coma for a couple days    COVID-19 virus detected 10/28/2019   Diabetes mellitus without complication (HCC)    type II   ESRD (end stage renal disease) ([place])    Heart murmur    followed as a child only   HSIL (high grade squamous intraepithelial lesion) on Pap smear of cervix    Hyperlipidemia associated with type 2 diabetes mellitus ([place])    Hypertension  Pancreatitis    Peripheral vascular disease [facility])    Past Surgical History:  Procedure Laterality Date   AMPUTATION TOE Left 2013   2nd toe. tip of toe (toe nail was infected)   AV FISTULA PLACEMENT Left 05/11/2018   Procedure: ARTERIOVENOUS (AV) FISTULA CREATION;  Surgeon: Katha Cabal, MD;  Location: [facility] ORS;  Service: Vascular;  Laterality: Left;   CATARACT EXTRACTION     CERVICAL CONIZATION W/BX N/A 04/08/2020   Procedure: CONIZATION CERVIX WITH BIOPSY;  Surgeon: Mellody Drown, MD;  Location: [facility] ORS;  Service: Gynecology;  Laterality: N/A;   CHOLECYSTECTOMY  2014   COLONOSCOPY      COLONOSCOPY WITH PROPOFOL N/A 01/10/2018   Procedure: COLONOSCOPY WITH PROPOFOL;  Surgeon: Toledo, Benay Pike, MD;  Location: [facility] ENDOSCOPY;  Service: Gastroenterology;  Laterality: N/A;   DIALYSIS/PERMA CATHETER INSERTION N/A 05/21/2018   Procedure: DIALYSIS/PERMA CATHETER INSERTION;  Surgeon: Algernon Huxley, MD;  Location: [place] CV LAB;  Service: Cardiovascular;  Laterality: N/A;   DIALYSIS/PERMA CATHETER INSERTION N/A 07/14/2020   Procedure: DIALYSIS/PERMA CATHETER INSERTION;  Surgeon: Algernon Huxley, MD;  Location: [place] CV LAB;  Service: Cardiovascular;  Laterality: N/A;   DIALYSIS/PERMA CATHETER REMOVAL N/A 04/11/2019   Procedure: DIALYSIS/PERMA CATHETER REMOVAL;  Surgeon: Algernon Huxley, MD;  Location: [place] [facility] LAB;  Service: Cardiovascular;  Laterality: N/A;   EYE SURGERY Bilateral 2018   cataract extractions   IR IMAGING GUIDED PORT INSERTION  06/05/2020   THROMBECTOMY W/ EMBOLECTOMY  05/11/2018   Procedure: THROMBECTOMY ARTERIOVENOUS FISTULA;  Surgeon: Katha Cabal, MD;  Location: [facility] ORS;  Service: Vascular;;   TUBAL LIGATION  1984   Family History  Problem Relation Age of Onset   Stroke Mother    Hypertension Mother    Gout Mother    Cancer Father    Diabetes Sister    Diabetes Maternal Grandmother    Diabetes Maternal Grandfather    Diabetes Paternal Grandmother    Diabetes Paternal Grandfather    Social History   Socioeconomic History   Marital status: Married    Spouse name: clarence   Number of children: 2   Years of education: Not on file   Highest education level: Not on file  Occupational History    Comment: works nights  Tobacco Use   Smoking status: Never   Smokeless tobacco: Never  Vaping Use   Vaping Use: Never used  Substance and Sexual Activity   Alcohol use: No   Drug use: Never   Sexual activity: Not Currently  Other Topics Concern   Not on file  Social History Narrative   Not on file   Social Determinants of Health    Financial Resource Strain: Not on file  Food Insecurity: Not on file  Transportation Needs: Not on file  Physical Activity: Not on file  Stress: Not on file  Social Connections: Not on file   Immunization History  Administered Date(s) Administered   Hepb-cpg 05/02/2019, 07/01/2019   Influenza-Unspecified 07/10/2018, 07/10/2018, 06/24/2019, 06/24/2019   PPD Test 06/09/2017, 06/23/2017, 05/15/2018, 05/31/2018, 05/31/2018, 03/11/2019, 03/11/2019, 06/24/2019, 06/24/2019, 08/20/2019, 08/20/2019   Pneumococcal Conjugate-13 10/05/2015, 07/11/2018   Pneumococcal Polysaccharide-23 10/04/2013, 07/11/2018, 07/11/2018   Tdap 10/19/2015   Allergies  Allergen Reactions   Amlodipine Swelling    Knees down to ankles Knees down to ankles   Hctz [Hydrochlorothiazide]     pancreatitis   Heparin Other (See Comments)    Pt reports cardiac arrest when given heparin   Lmw Heparin  Reports cardiac arrest when given heparin   Other Other (See Comments)    Reports cardiac arrest when given during surgery Reports cardiac arrest when given during surgery Reports cardiac arrest when given during surgery    Sulfa Antibiotics Rash   Dairy Aid [Tilactase]     Runny nose   Hydralazine Nausea Only   Current Outpatient Medications on File Prior to Visit  Medication Sig Dispense Refill   amitriptyline (ELAVIL) 10 MG tablet daily as needed.     amLODipine (NORVASC) 10 MG tablet Take by mouth.     calcitRIOL (ROCALTROL) 0.25 MCG capsule Take 0.25 mcg by mouth daily.     calcium acetate (PHOSLO) 667 MG capsule Take 1,334 mg by mouth 2 (two) times daily with a meal.      carvedilol (COREG CR) 40 MG 24 hr capsule Take by mouth.     carvedilol (COREG) 25 MG tablet Take 25 mg by mouth 2 (two) times daily with a meal.      diltiazem (TIAZAC) 120 MG 24 hr capsule Take 1 capsule by mouth daily.     doxazosin (CARDURA) 4 MG tablet Take by mouth.     ergocalciferol (VITAMIN D2) 1.25 MG (50000 UT) capsule Take  by mouth.     Nutritional Supplements (FEEDING SUPPLEMENT, NEPRO CARB STEADY,) LIQD Take 237 mLs by mouth 2 (two) times daily between meals. 237 mL 0   cloNIDine (CATAPRES - DOSED IN MG/24 HR) 0.1 mg/24hr patch Place 1 patch (0.1 mg total) onto the skin once a week. (Patient not taking: Reported on 09/15/2021) 4 patch 12   clopidogrel (PLAVIX) 75 MG tablet Take 75 mg by mouth daily.  (Patient not taking: Reported on 09/15/2021)     DILT-XR 180 MG 24 hr capsule Take 180 mg by mouth daily.  (Patient not taking: Reported on 09/15/2021)     diphenoxylate-atropine (LOMOTIL) 2.5-0.025 MG tablet Take 1 tablet by mouth 4 (four) times daily as needed for diarrhea or loose stools. (Patient not taking: Reported on 09/15/2021) 30 tablet 0   fluconazole (DIFLUCAN) 100 MG tablet Take 1 tablet (100 mg total) by mouth daily. (Patient not taking: Reported on 09/15/2021) 10 tablet 0   lidocaine-prilocaine (EMLA) cream Apply 1 application topically as needed. Apply to port 1-2 hours prior to chemotherapy, cover with plastic wrap. (Patient not taking: Reported on 09/15/2021) 30 g 2   multivitamin (RENA-VIT) TABS tablet Take 1 tablet by mouth daily before breakfast. (Patient not taking: Reported on 09/15/2021) 30 tablet 0   ondansetron (ZOFRAN) 4 MG tablet Take 4 mg by mouth 2 (two) times daily.  (Patient not taking: Reported on 09/15/2021)     Potassium Chloride ER 20 MEQ TBCR Take 1 tablet by mouth daily.  (Patient not taking: Reported on 09/15/2021)     prochlorperazine (COMPAZINE) 10 MG tablet Take 1 tablet (10 mg total) by mouth every 6 (six) hours as needed (Nausea or vomiting). (Patient not taking: Reported on 09/15/2021) 60 tablet 1   [DISCONTINUED] diltiazem (CARDIZEM CD) 120 MG 24 hr capsule Take 120 mg by mouth daily.     No current facility-administered medications on file prior to visit.   Review of Systems General:  no complaints Skin: no complaints Eyes: no complaints HEENT: no complaints Breasts: no  complaints Pulmonary: no complaints Cardiac: no complaints Gastrointestinal: no complaints Genitourinary/Sexual: no complaints Ob/Gyn: no complaints Musculoskeletal: no complaints Hematology: no complaints Neurologic/Psych: no complaints   Objective:  Physical Examination:  Today's Vitals   09/15/21  1111  BP: (!) 158/66  Pulse: 70  Resp: 20  Temp: 97.8 F (36.6 C)  SpO2: 100%  Weight: 133 lb 14.4 oz (60.7 kg)   Body mass index is 23.72 kg/m.  ECOG Performance Status: 2 - Symptomatic, <50% confined to bed  GENERAL: Patient is a well appearing female in no acute distress HEENT:  Sclera clear. Anicteric NODES:  Negative axillary, supraclavicular, inguinal lymph node survery LUNGS:  Clear to auscultation bilaterally.   HEART:  Regular rate and rhythm.  ABDOMEN:  Soft, nontender.  No hernias, incisions well healed. No masses or ascites EXTREMITIES:  No peripheral edema. Atraumatic. No cyanosis SKIN:  Clear with no obvious rashes or skin changes.  NEURO:  Nonfocal. Well oriented.  Appropriate affect.  Pelvic exam: exam chaperoned by CMA: EGBUS: normal.  Vagina: normal atrophic mucosa with some thin discharge. Cervix: no obvious tumor, smooth Bimanual: normal uterus. No masses. Parametria normal.   RV confirms.  No labs on site today    Assessment:  Tracy Jennings is a 66 y.o. female diagnosed with clear cell adenocarcinoma involving the cervix, unclear if cervical or uterine primary, but strongly favor cervical as this was detected by PAP/cervical biopsy with no bleeding and the bulk of the cancer is in the cervix based on PET scan and cone/D&C.  Clinically she had stage I disease, but difficult to assign substage.  The PET/CT did not really show a tumor mass in the cervix despite some FDG positivity there.  It could also be a primary endometrial cancer, and this cannot be ruled out completely.  No evidence of metastatic disease on PET.   She is s/p 3 cycles of cisplatin and  concurrent radiation completed 06/08/20. Treatment complicated by anemia requiring transfusion, nausea, diarrhea, and electrolyte abnormalities due to colitis. Completed brachytherapy with Dr. Christel Mormon 08/14/20 and got blood transfusion for anemia. Saw Dr Christel Mormon 3/22 and PET CT was without disease.  No evidence of disease on exam today.  ESRD on hemodialysis. Renal failure associated anemia.   Medical co-morbidities complicating care: AODM (not on meds now, but BS high 6/96), HTN (systolic high today), ESRD on dialysis, carotid stenosis on Plavix and ASA.  Plan:   Encounter for follow-up surveillance of cervical cancer  She has follow up planned with Dr Christel Mormon in April 2023. Will continue alternating visits with Dr. Christel Mormon every 3 months.   RTC in 6 months in [place].  She will potentially be a candidate for renal transplant in 2024.  The patient's diagnosis, an outline of the further diagnostic and laboratory studies which will be required, the recommendation, and alternatives were discussed.  All questions were answered to the patient's satisfaction.  Verlon Au, NP  I personally interviewed and examined the patient. Agreed with the above/below plan of care. I have directly contributed to assessment and plan of care of this patient and educated and discussed with patient and family.  Mellody Drown, MD

## 2021-09-15 NOTE — Progress Notes (Signed)
Patient has an allergy to heparin. Port flushed with NS as ordered by Dr.Finnegan.

## 2021-11-10 ENCOUNTER — Inpatient Hospital Stay: Payer: Medicare Other | Attending: Obstetrics and Gynecology

## 2021-12-15 ENCOUNTER — Other Ambulatory Visit: Payer: Self-pay | Admitting: Family Medicine

## 2022-01-05 ENCOUNTER — Inpatient Hospital Stay: Payer: Medicare Other | Attending: Obstetrics and Gynecology

## 2022-01-05 DIAGNOSIS — Z452 Encounter for adjustment and management of vascular access device: Secondary | ICD-10-CM | POA: Insufficient documentation

## 2022-01-05 DIAGNOSIS — Z95828 Presence of other vascular implants and grafts: Secondary | ICD-10-CM

## 2022-01-05 DIAGNOSIS — C539 Malignant neoplasm of cervix uteri, unspecified: Secondary | ICD-10-CM | POA: Diagnosis present

## 2022-01-05 MED ORDER — SODIUM CHLORIDE 0.9% FLUSH
10.0000 mL | INTRAVENOUS | Status: DC | PRN
  Administered 2022-01-05: 10 mL via INTRAVENOUS
  Filled 2022-01-05: qty 10

## 2022-01-05 MED ORDER — HEPARIN SOD (PORK) LOCK FLUSH 100 UNIT/ML IV SOLN
500.0000 [IU] | Freq: Once | INTRAVENOUS | Status: AC
  Administered 2022-01-05: 500 [IU] via INTRAVENOUS
  Filled 2022-01-05: qty 5

## 2022-01-05 NOTE — Progress Notes (Signed)
Patient reports she is not allergic to Heparin. States she gets it with dialysis and has no concerns. Patient also had Heparin with portflush in 11/22. Reports Heparin allergy that was added to her chart is incorrect.  ?

## 2022-02-17 ENCOUNTER — Other Ambulatory Visit: Payer: Self-pay | Admitting: Family Medicine

## 2022-02-17 DIAGNOSIS — Z1231 Encounter for screening mammogram for malignant neoplasm of breast: Secondary | ICD-10-CM

## 2022-03-11 ENCOUNTER — Ambulatory Visit
Admission: RE | Admit: 2022-03-11 | Discharge: 2022-03-11 | Disposition: A | Discharge status: Home / Self Care | Payer: Medicare Other | Source: Ambulatory Visit | Attending: Family Medicine | Admitting: Family Medicine

## 2022-03-11 DIAGNOSIS — Z1231 Encounter for screening mammogram for malignant neoplasm of breast: Secondary | ICD-10-CM | POA: Insufficient documentation

## 2022-03-16 ENCOUNTER — Inpatient Hospital Stay: Payer: Medicare Other

## 2022-03-16 NOTE — Progress Notes (Deleted)
Gynecologic Oncology Consult Note [facility]  [phone] [phone]  Patient Care Team: Sharyne Peach, MD as PCP - General (Family Medicine) Wellington Hampshire, MD as PCP - Cardiology (Cardiology) Noreene Filbert, MD as Radiation Oncologist (Radiation Oncology) Clent Jacks, RN as Oncology Nurse Navigator   Name of the patient: Tracy Jennings  [id number]  Dec 08, 1955   Date of visit: 03/16/22  Referring Provider: Dr Leonides Schanz  Chief Concern: Cervical adenocarcinoma  Subjective:  Tracy Jennings is a 66 y.o. female with ESRD on dialysis who is seen in consultation from Dr Leonides Schanz for adenocarcinoma of the cervix diagnosed 6/21 s/p 3 cycles of cisplatin with pelvic radiation followed by brachytherapy who returns to clinic for surveillance.   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  She completed 3 cycles of cisplatin and pelvic radiation on 10/21 followed by vaginal brachytherapy with Dr. Christel Mormon at [facility] from 07/10/2020-08/14/2020.  4 total fractions.  Difficulty with scheduling due to transportation and conflict with her dialysis schedule.  She continues hemodialysis 3 times a week.  She saw Dr. Christel Mormon at [facility] on 06/15/21.   History of ESRD, previously considered for kidney transplant, more recently note states, appears patient has 2-3 year wait following completion of treatment before she could consider her for listing. Case was closed and recommended she continue treatment with current dialysis unit, [facility].    She reports feeling well and denies complaints. Just some diarrhea since radiation.   Gynecology Oncology History PAP: Atypical squamous cells cannot exclude HSIL (ASC-H)  Seen by Dr Larey Days 03/10/20 On exam: Vagina normal, well-estrogenized vaginal mucosa, no evidence of vaginal neoplasia, Cervix: No overt carcinoma, well-visualized   Colposcopic exam: TZ not seen entirely, AWE not seen. No lesions seen.  ECC was done, random cervical biopsies were taken Pathology 10:00 - suspicious for adenocarcinoma, 6:00 bx and ECC - adenocarcinoma  She was initially seen on 03/11/2020 in the [facility] and cervix looked and felt normal.  PET/CT to evaluate origin of the cancer (uterine vs. cervical origin) and assess for extent of disease.  PET scan 03/13/20 shows some low level FDG activity in the cervix (none in uterus), but no prominent cervical mass.  Some PET activity in pelvic nodes, but not above blood pool and no other primary or metastatic disease noted.  Initial plan was to proceed with chemoradiation for cervical cancer.  However, without any knowledge of the size or depth of invasion of the cancer in the cervix based on exam and imaging it was decided that a cone biopsy and D&C was needed to confirm that there is more than 3 mm of invasive cancer in the cervix and that there was no endometrial primary.  If cancer was confined to the cervix and invasion and < 61m invasion, this could be sufficiently treated with a large cone biopsy with negative margins, and it would be better to avoid the potential toxicity of chemoradiation in view of her existing vascular disease and dialysis.  If she is proven to have a cervical cancer with > 3 mm of invasion or LVSI would proceed with chemoradiation.   History significant for significant vascular disease with end stage renal disease, on peritoneal dialysis and carotid stenosis. She is on transplant list at [facility] On Plavix/asa for thrombosis prophylaxis. She had a thrombosis of her AVF fistula in 2019 after this was placed  and had cardiac arrest post thrombectomy.  She was seen by cardiology on 03/30/2020 and felt to be doing well from cardiac perspective and given clearance for surgery. Echo in 10/2019 revealed EF 60-65%. EKG 03/30/20 demonstrated NSR, 77 bpm, rare PVC, lVH, poor R wave progression  along precordial leads, no acute st/t changes. Blood pressure is therapeutically elevated due to history of orthostatic hypotension. She is felt to be low risk for noncardiac surgery. Vascular surgery recommended holding clopidogrel for 5 days prior to surgery and for 2 days post operatively. She continues peritoneal dialysis.   Cone biopsy 04/08/20  A. CERVIX, CONIZATION BIOPSY:  - INVASIVE ADENOCARCINOMA WITH INVOLVEMENT OF ENDOCERVICAL AND DEEP MARGINS.  B. ENDOCERVIX, CURETTAGE:  - SCANT DETACHED AND DEGENERATED CELLS, SUSPICIOUS FOR MALIGNANCY.  C. ENDOMETRIUM, CURETTAGE: - INVASIVE ADENOCARCINOMA.   Comment:  Sections demonstrate an infiltrative adenocarcinoma with areas of marked nuclear pleomorphism.  This high-grade adenocarcinoma shows diffuse  positive staining for p16.  Staining for p53 appears to demonstrate a null (mutated) pattern, which would rule out an HPV-associated endocervical adenocarcinoma and would favor the diagnosis of high-grade serous carcinoma.  However, there is not an internal positive control, and therefore the stain cannot be interpreted as null with complete certainty.  MMR stains were performed to possibly aid in diagnosing an endometrioid carcinoma, with no loss of MMR proteins.  The case has been sent for expert consultation and second opinion, with an addendum to follow.   ([facility] review below) A. 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 The ectocervical margin appears uninvolved. No lymphovascular invasion is identified. Chromogenic in situ hybridization for high risk HPV E6/7 mRNA (types 16, 18, 26, 31, 33, 35, 39, 45, 51, 52, 53, 56, 58, 59, 66, 68, 73 and 82) is negative in the cells of interest.  Immunohistochemistry was performed on blocks A2 and A5 at [facility] to characterize the pathologic process and demonstrates the following immunophenotype in the cells of interest: POSITIVE:  HNF1B, AMACR, Napsin A, p16 (diffuse), Ki-67 elevated, NEGATIVE: ER, PR. This staining profile supports the above diagnosis.  B. Endocervix, curettage: Scant detached atypical cells with degenerative changes, suspicious for adenocarcinoma.  C. Endometrium, curettage: Clear cell adenocarcinoma, present in a minute fragment of stroma, see COMMENT. COMMENT: The sections demonstrate an infiltrative glandular proliferation of malignant cells with marked nuclear pleomorphism, undermining endocervical and ectocervical epithelium at the squamocolumnar junction. The lesion measures at least 8 mm in lateral extent and invades 1 mm of cervical stroma, but extension to the endocervical and deep margins precludes definitive measurement. Although the bulk of the lesion appears in the cone biopsy, an endometrial primary cannot be excluded.  In view of pathology report and PET scan believe this is most likely a primary cervical clear cell adenocarcinoma and she started chemoradiation 05/11/20. Exam at that time showed Necrotic debris over cone bed and this was removed with swab.  Granulation tissue underneath.  05/21/20-06/08/20- 3 cycles cisplatin 05/13/20-10/7-  EBRT w/ Dr. Baruch Gouty at [facility] 06/21/20 admitted to [facility] for nausea/vomiting/diarrhea due to colitis from radiation 07/06/20-08/13/20- HDR w/ Dr. Christel Mormon at [place] scan 10/21 IMPRESSION: 1. Relatively decompressed, but thick walled colon, consistent with colitis. Favor infection. 2. Decrease in trace abdominopelvic fluid with peritoneal dialysis catheter in place. 3. New hypoattenuation within the superior anterior spleen, suspicious for infarct. 4. Subtle pericystic edema, possibly radiation induced. Recommend clinical exclusion of infectious cystitis.  5. Coronary artery atherosclerosis. Aortic Atherosclerosis  6. Left adrenal nodule, favored to represent an adenoma.  Medical history Menopause age 51.  No bleeding since.  No pelvic pain.   UTI a month  ago and some vaginal discharge.   On peritoneal dialysis for about a year and stopped working. Takes iron shots for anemia.  Patient hospitalized three days for mild COVID19 pneumonia in February 2021 2/21 Carotid US 1. Bilateral carotid bifurcation plaque resulting in less than 50% diameter ICA stenosis. 2. Antegrade bilateral vertebral arterial flow. Cardiac Echo EF 60-65%  HIV 10/29/19- nonreactive  Oncology History  Cervical cancer ([place])  03/11/2020 Initial Diagnosis   Cervical cancer ([place])   05/21/2020 - 06/08/2020 Chemotherapy   Patient is on Treatment Plan : CERVICAL Cisplatin q7d (dose reduced secondary to ESRD)      Patient Active Problem List   Diagnosis Date Noted   Failure to thrive in adult    Pressure injury of skin 06/27/2020   Thrush    Anemia of chronic disease    Palliative care by specialist    Generalized abdominal pain    Acute blood loss anemia    Intractable vomiting 06/21/2020   Hypokalemia 06/21/2020   Hyponatremia 06/21/2020   Hypomagnesemia 06/21/2020   Intractable nausea and vomiting 06/21/2020   Nausea vomiting and diarrhea 06/21/2020   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[place]) 03/31/2020   Goals of care, counseling/discussion 03/20/2020   Cervical cancer ([place]) 03/11/2020   Orthostatic hypotension    Syncope 11/04/2019   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   Type 2 diabetes mellitus with other specified complication ([place]) 86/57/8469   ESRD on peritoneal dialysis ([place]) 11/03/2019   Unable to care for self 11/03/2019   Accelerated hypertension 62/95/2841   Metabolic acidosis, increased anion gap (IAG)    COVID-19    Generalized weakness    Recurrent syncope 10/28/2019   HIT (heparin-induced thrombocytopenia) ([place]) 32/44/0102   Complication of vascular access for dialysis 05/11/2018   Respiratory arrest ([place]) 05/11/2018   Hyperlipidemia 04/12/2018   Pancreatitis, recurrent 11/01/2017   Essential hypertension 11/01/2017    ESRD (end stage renal disease) ([place]) 11/01/2017   Recurrent pancreatitis 11/01/2017   Past Medical History:  Diagnosis Date   Anemia 04/2018   low iron. to be started on supplements   Cervical cancer ([place])    CKD (chronic kidney disease)    Stage IV   Complication of anesthesia    receceived too much anesthesia, that she was in coma for a couple days    COVID-19 virus detected 10/28/2019   Diabetes mellitus without complication (HCC)    type II   ESRD (end stage renal disease) ([place])    Heart murmur    followed as a child only   HSIL (high grade squamous intraepithelial lesion) on Pap smear of cervix    Hyperlipidemia associated with type 2 diabetes mellitus ([place])    Hypertension    Pancreatitis    Peripheral vascular disease ([place])    Past Surgical History:  Procedure Laterality Date   AMPUTATION TOE Left 2013   2nd toe. tip of toe (toe nail was infected)   AV FISTULA PLACEMENT Left 05/11/2018   Procedure: ARTERIOVENOUS (AV) FISTULA CREATION;  Surgeon: Katha Cabal, MD;  Location: [facility] ORS;  Service: Vascular;  Laterality: Left;   CATARACT EXTRACTION     CERVICAL CONIZATION W/BX N/A 04/08/2020   Procedure: CONIZATION CERVIX WITH BIOPSY;  Surgeon: Mellody Drown, MD;  Location: [facility] ORS;  Service: Gynecology;  Laterality: N/A;   CHOLECYSTECTOMY  2014   COLONOSCOPY     COLONOSCOPY WITH PROPOFOL N/A 01/10/2018   Procedure: COLONOSCOPY WITH PROPOFOL;  Surgeon: Toledo, Benay Pike, MD;  Location: [facility] ENDOSCOPY;  Service: Gastroenterology;  Laterality: N/A;   DIALYSIS/PERMA CATHETER INSERTION N/A 05/21/2018   Procedure: DIALYSIS/PERMA CATHETER INSERTION;  Surgeon: Algernon Huxley, MD;  Location: [place] CV LAB;  Service: Cardiovascular;  Laterality: N/A;   DIALYSIS/PERMA CATHETER INSERTION N/A 07/14/2020   Procedure: DIALYSIS/PERMA CATHETER INSERTION;  Surgeon: Algernon Huxley, MD;  Location: [place] CV LAB;  Service: Cardiovascular;  Laterality: N/A;   DIALYSIS/PERMA CATHETER  REMOVAL N/A 04/11/2019   Procedure: DIALYSIS/PERMA CATHETER REMOVAL;  Surgeon: Algernon Huxley, MD;  Location: [place] CV LAB;  Service: Cardiovascular;  Laterality: N/A;   EYE SURGERY Bilateral 2018   cataract extractions   IR IMAGING GUIDED PORT INSERTION  06/05/2020   THROMBECTOMY W/ EMBOLECTOMY  05/11/2018   Procedure: THROMBECTOMY ARTERIOVENOUS FISTULA;  Surgeon: Katha Cabal, MD;  Location: [facility] ORS;  Service: Vascular;;   TUBAL LIGATION  1984   Family History  Problem Relation Age of Onset   Stroke Mother    Hypertension Mother    Gout Mother    Cancer Father    Diabetes Sister    Diabetes Maternal Grandmother    Diabetes Maternal Grandfather    Diabetes Paternal Grandmother    Diabetes Paternal Grandfather    Social History   Socioeconomic History   Marital status: Married    Spouse name: clarence   Number of children: 2   Years of education: Not on file   Highest education level: Not on file  Occupational History    Comment: works nights  Tobacco Use   Smoking status: Never   Smokeless tobacco: Never  Vaping Use   Vaping Use: Never used  Substance and Sexual Activity   Alcohol use: No   Drug use: Never   Sexual activity: Not Currently  Other Topics Concern   Not on file  Social History Narrative   Not on file   Social Determinants of Health   Financial Resource Strain: Not on file  Food Insecurity: Not on file  Transportation Needs: Not on file  Physical Activity: Not on file  Stress: Not on file  Social Connections: Not on file   Immunization History  Administered Date(s) Administered   Hepb-cpg 05/02/2019, 07/01/2019   Influenza-Unspecified 07/10/2018, 07/10/2018, 06/24/2019, 06/24/2019   PPD Test 06/09/2017, 06/23/2017, 05/15/2018, 05/31/2018, 05/31/2018, 03/11/2019, 03/11/2019, 06/24/2019, 06/24/2019, 08/20/2019, 08/20/2019   Pneumococcal Conjugate-13 10/05/2015, 07/11/2018   Pneumococcal Polysaccharide-23 10/04/2013, 07/11/2018,  07/11/2018   Tdap 10/19/2015   Allergies  Allergen Reactions   Amlodipine Swelling    Knees down to ankles Knees down to ankles   Hctz [Hydrochlorothiazide]     pancreatitis   Heparin Other (See Comments)    Pt reports cardiac arrest when given heparin   Lmw Heparin     Reports cardiac arrest when given heparin   Other Other (See Comments)    Reports cardiac arrest when given during surgery Reports cardiac arrest when given during surgery Reports cardiac arrest when given during surgery    Sulfa Antibiotics Rash   Dairy Aid [Tilactase]     Runny nose   Hydralazine Nausea Only   Current Outpatient Medications on File Prior to Visit  Medication Sig Dispense Refill   amitriptyline (ELAVIL) 10 MG tablet daily as needed.     amLODipine (NORVASC) 10 MG tablet Take by mouth.     calcitRIOL (  ROCALTROL) 0.25 MCG capsule Take 0.25 mcg by mouth daily.     calcium acetate (PHOSLO) 667 MG capsule Take 1,334 mg by mouth 2 (two) times daily with a meal.      carvedilol (COREG CR) 40 MG 24 hr capsule Take by mouth.     carvedilol (COREG) 25 MG tablet Take 25 mg by mouth 2 (two) times daily with a meal.      cloNIDine (CATAPRES - DOSED IN MG/24 HR) 0.1 mg/24hr patch Place 1 patch (0.1 mg total) onto the skin once a week. (Patient not taking: Reported on 09/15/2021) 4 patch 12   clopidogrel (PLAVIX) 75 MG tablet Take 75 mg by mouth daily.  (Patient not taking: Reported on 09/15/2021)     DILT-XR 180 MG 24 hr capsule Take 180 mg by mouth daily.  (Patient not taking: Reported on 09/15/2021)     diltiazem (TIAZAC) 120 MG 24 hr capsule Take 1 capsule by mouth daily.     diphenoxylate-atropine (LOMOTIL) 2.5-0.025 MG tablet Take 1 tablet by mouth 4 (four) times daily as needed for diarrhea or loose stools. (Patient not taking: Reported on 09/15/2021) 30 tablet 0   doxazosin (CARDURA) 4 MG tablet Take by mouth.     ergocalciferol (VITAMIN D2) 1.25 MG (50000 UT) capsule Take by mouth.     fluconazole (DIFLUCAN)  100 MG tablet Take 1 tablet (100 mg total) by mouth daily. (Patient not taking: Reported on 09/15/2021) 10 tablet 0   lidocaine-prilocaine (EMLA) cream Apply 1 application topically as needed. Apply to port 1-2 hours prior to chemotherapy, cover with plastic wrap. (Patient not taking: Reported on 09/15/2021) 30 g 2   multivitamin (RENA-VIT) TABS tablet Take 1 tablet by mouth daily before breakfast. (Patient not taking: Reported on 09/15/2021) 30 tablet 0   Nutritional Supplements (FEEDING SUPPLEMENT, NEPRO CARB STEADY,) LIQD Take 237 mLs by mouth 2 (two) times daily between meals. 237 mL 0   ondansetron (ZOFRAN) 4 MG tablet Take 4 mg by mouth 2 (two) times daily.  (Patient not taking: Reported on 09/15/2021)     Potassium Chloride ER 20 MEQ TBCR Take 1 tablet by mouth daily.  (Patient not taking: Reported on 09/15/2021)     prochlorperazine (COMPAZINE) 10 MG tablet Take 1 tablet (10 mg total) by mouth every 6 (six) hours as needed (Nausea or vomiting). (Patient not taking: Reported on 09/15/2021) 60 tablet 1   [DISCONTINUED] diltiazem (CARDIZEM CD) 120 MG 24 hr capsule Take 120 mg by mouth daily.     No current facility-administered medications on file prior to visit.   Review of Systems General:  no complaints Skin: no complaints Eyes: no complaints HEENT: no complaints Breasts: no complaints Pulmonary: no complaints Cardiac: no complaints Gastrointestinal: no complaints Genitourinary/Sexual: no complaints Ob/Gyn: no complaints Musculoskeletal: no complaints Hematology: no complaints Neurologic/Psych: no complaints   Objective:  Physical Examination:  There were no vitals filed for this visit.  There is no height or weight on file to calculate BMI.  ECOG Performance Status: 2 - Symptomatic, <50% confined to bed  GENERAL: Patient is a well appearing female in no acute distress HEENT:  Sclera clear. Anicteric NODES:  Negative axillary, supraclavicular, inguinal lymph node survery LUNGS:   Clear to auscultation bilaterally.   HEART:  Regular rate and rhythm.  ABDOMEN:  Soft, nontender.  No hernias, incisions well healed. No masses or ascites EXTREMITIES:  No peripheral edema. Atraumatic. No cyanosis SKIN:  Clear with no obvious rashes or skin changes.  NEURO:  Nonfocal. Well oriented.  Appropriate affect.  Pelvic exam: exam chaperoned by CMA: EGBUS: normal.  Vagina: normal atrophic mucosa with some thin discharge. Cervix: no obvious tumor, smooth Bimanual: normal uterus. No masses. Parametria normal.   RV confirms.  No labs on site today    Assessment:  Tracy Jennings is a 66 y.o. female diagnosed with clear cell adenocarcinoma involving the cervix, unclear if cervical or uterine primary, but strongly favor cervical as this was detected by PAP/cervical biopsy with no bleeding and the bulk of the cancer is in the cervix based on PET scan and cone/D&C.  Clinically she had stage I disease, but difficult to assign substage.  The PET/CT did not really show a tumor mass in the cervix despite some FDG positivity there.  It could also be a primary endometrial cancer, and this cannot be ruled out completely.  No evidence of metastatic disease on PET.   She is s/p 3 cycles of cisplatin and concurrent radiation completed 06/08/20. Treatment complicated by anemia requiring transfusion, nausea, diarrhea, and electrolyte abnormalities due to colitis. Completed brachytherapy with Dr. Christel Mormon 08/14/20 and got blood transfusion for anemia. Saw Dr Christel Mormon 3/22 and PET CT was without disease.  No evidence of disease on exam today.  ESRD on hemodialysis. Renal failure associated anemia.   Medical co-morbidities complicating care: AODM (not on meds now, but BS high 0/63), HTN (systolic high today), ESRD on dialysis, carotid stenosis on Plavix and ASA.  Plan:   Malignant neoplasm of endocervix [facility])  She has follow up planned with Dr Christel Mormon in April 2023. Will continue alternating visits with Dr. Christel Mormon  every 3 months.   RTC in 6 months in [place].  She will potentially be a candidate for renal transplant in 2024.  The patient's diagnosis, an outline of the further diagnostic and laboratory studies which will be required, the recommendation, and alternatives were discussed.  All questions were answered to the patient's satisfaction.  Mellody Drown, MD  I personally interviewed and examined the patient. Agreed with the above/below plan of care. I have directly contributed to assessment and plan of care of this patient and educated and discussed with patient and family.  Mellody Drown, MD

## 2022-03-17 ENCOUNTER — Telehealth: Payer: Self-pay | Admitting: Obstetrics and Gynecology

## 2022-03-17 NOTE — Telephone Encounter (Signed)
pt N/S to appts called in to r/s.Marland KitchenKJ

## 2022-03-27 ENCOUNTER — Other Ambulatory Visit: Payer: Self-pay

## 2022-03-27 ENCOUNTER — Emergency Department: Payer: Medicare Other

## 2022-03-27 ENCOUNTER — Inpatient Hospital Stay
Admission: EM | Admit: 2022-03-27 | Discharge: 2022-04-01 | DRG: 304 | Disposition: A | Discharge status: Home / Self Care | Payer: Medicare Other | Attending: Internal Medicine | Admitting: Internal Medicine

## 2022-03-27 DIAGNOSIS — E1169 Type 2 diabetes mellitus with other specified complication: Secondary | ICD-10-CM | POA: Diagnosis present

## 2022-03-27 DIAGNOSIS — Z9841 Cataract extraction status, right eye: Secondary | ICD-10-CM

## 2022-03-27 DIAGNOSIS — R778 Other specified abnormalities of plasma proteins: Secondary | ICD-10-CM | POA: Diagnosis present

## 2022-03-27 DIAGNOSIS — F40240 Claustrophobia: Secondary | ICD-10-CM | POA: Diagnosis present

## 2022-03-27 DIAGNOSIS — F03918 Unspecified dementia, unspecified severity, with other behavioral disturbance: Secondary | ICD-10-CM | POA: Diagnosis present

## 2022-03-27 DIAGNOSIS — R9431 Abnormal electrocardiogram [ECG] [EKG]: Secondary | ICD-10-CM | POA: Diagnosis present

## 2022-03-27 DIAGNOSIS — Z89422 Acquired absence of other left toe(s): Secondary | ICD-10-CM

## 2022-03-27 DIAGNOSIS — Z8249 Family history of ischemic heart disease and other diseases of the circulatory system: Secondary | ICD-10-CM

## 2022-03-27 DIAGNOSIS — Z8673 Personal history of transient ischemic attack (TIA), and cerebral infarction without residual deficits: Secondary | ICD-10-CM

## 2022-03-27 DIAGNOSIS — Z8616 Personal history of COVID-19: Secondary | ICD-10-CM

## 2022-03-27 DIAGNOSIS — I161 Hypertensive emergency: Secondary | ICD-10-CM | POA: Diagnosis not present

## 2022-03-27 DIAGNOSIS — Z9221 Personal history of antineoplastic chemotherapy: Secondary | ICD-10-CM

## 2022-03-27 DIAGNOSIS — I120 Hypertensive chronic kidney disease with stage 5 chronic kidney disease or end stage renal disease: Secondary | ICD-10-CM | POA: Diagnosis present

## 2022-03-27 DIAGNOSIS — Z888 Allergy status to other drugs, medicaments and biological substances status: Secondary | ICD-10-CM

## 2022-03-27 DIAGNOSIS — R1084 Generalized abdominal pain: Secondary | ICD-10-CM | POA: Diagnosis present

## 2022-03-27 DIAGNOSIS — M62838 Other muscle spasm: Secondary | ICD-10-CM | POA: Diagnosis present

## 2022-03-27 DIAGNOSIS — Z992 Dependence on renal dialysis: Secondary | ICD-10-CM

## 2022-03-27 DIAGNOSIS — Z79899 Other long term (current) drug therapy: Secondary | ICD-10-CM

## 2022-03-27 DIAGNOSIS — Z5329 Procedure and treatment not carried out because of patient's decision for other reasons: Secondary | ICD-10-CM | POA: Diagnosis present

## 2022-03-27 DIAGNOSIS — N186 End stage renal disease: Secondary | ICD-10-CM | POA: Diagnosis present

## 2022-03-27 DIAGNOSIS — I10 Essential (primary) hypertension: Principal | ICD-10-CM

## 2022-03-27 DIAGNOSIS — Z9049 Acquired absence of other specified parts of digestive tract: Secondary | ICD-10-CM

## 2022-03-27 DIAGNOSIS — N2581 Secondary hyperparathyroidism of renal origin: Secondary | ICD-10-CM | POA: Diagnosis present

## 2022-03-27 DIAGNOSIS — Z823 Family history of stroke: Secondary | ICD-10-CM

## 2022-03-27 DIAGNOSIS — E1122 Type 2 diabetes mellitus with diabetic chronic kidney disease: Secondary | ICD-10-CM | POA: Diagnosis present

## 2022-03-27 DIAGNOSIS — Z833 Family history of diabetes mellitus: Secondary | ICD-10-CM

## 2022-03-27 DIAGNOSIS — Z8541 Personal history of malignant neoplasm of cervix uteri: Secondary | ICD-10-CM

## 2022-03-27 DIAGNOSIS — E1151 Type 2 diabetes mellitus with diabetic peripheral angiopathy without gangrene: Secondary | ICD-10-CM | POA: Diagnosis present

## 2022-03-27 DIAGNOSIS — R519 Headache, unspecified: Secondary | ICD-10-CM | POA: Diagnosis present

## 2022-03-27 DIAGNOSIS — K861 Other chronic pancreatitis: Secondary | ICD-10-CM | POA: Diagnosis present

## 2022-03-27 DIAGNOSIS — Z923 Personal history of irradiation: Secondary | ICD-10-CM

## 2022-03-27 DIAGNOSIS — J811 Chronic pulmonary edema: Secondary | ICD-10-CM | POA: Diagnosis present

## 2022-03-27 DIAGNOSIS — F0392 Unspecified dementia, unspecified severity, with psychotic disturbance: Secondary | ICD-10-CM | POA: Diagnosis present

## 2022-03-27 DIAGNOSIS — E785 Hyperlipidemia, unspecified: Secondary | ICD-10-CM | POA: Diagnosis present

## 2022-03-27 DIAGNOSIS — D631 Anemia in chronic kidney disease: Secondary | ICD-10-CM | POA: Diagnosis present

## 2022-03-27 DIAGNOSIS — D638 Anemia in other chronic diseases classified elsewhere: Secondary | ICD-10-CM | POA: Diagnosis present

## 2022-03-27 DIAGNOSIS — E8770 Fluid overload, unspecified: Secondary | ICD-10-CM | POA: Diagnosis present

## 2022-03-27 DIAGNOSIS — Z9842 Cataract extraction status, left eye: Secondary | ICD-10-CM

## 2022-03-27 DIAGNOSIS — I160 Hypertensive urgency: Secondary | ICD-10-CM | POA: Diagnosis present

## 2022-03-27 DIAGNOSIS — H532 Diplopia: Secondary | ICD-10-CM | POA: Diagnosis present

## 2022-03-27 DIAGNOSIS — Z881 Allergy status to other antibiotic agents status: Secondary | ICD-10-CM

## 2022-03-27 LAB — COMPREHENSIVE METABOLIC PANEL
ALT: 13 U/L (ref 0–44)
AST: 18 U/L (ref 15–41)
Albumin: 4 g/dL (ref 3.5–5.0)
Alkaline Phosphatase: 60 U/L (ref 38–126)
Anion gap: 17 — ABNORMAL HIGH (ref 5–15)
BUN: 62 mg/dL — ABNORMAL HIGH (ref 8–23)
CO2: 22 mmol/L (ref 22–32)
Calcium: 8.8 mg/dL — ABNORMAL LOW (ref 8.9–10.3)
Chloride: 99 mmol/L (ref 98–111)
Creatinine, Ser: 8.36 mg/dL — ABNORMAL HIGH (ref 0.44–1.00)
GFR, Estimated: 5 mL/min — ABNORMAL LOW (ref >60)
Glucose, Bld: 125 mg/dL — ABNORMAL HIGH (ref 70–99)
Potassium: 4.8 mmol/L (ref 3.5–5.1)
Sodium: 138 mmol/L (ref 135–145)
Total Bilirubin: 0.8 mg/dL (ref 0.3–1.2)
Total Protein: 7.6 g/dL (ref 6.5–8.1)

## 2022-03-27 LAB — LIPASE, BLOOD: Lipase: 31 U/L (ref 11–51)

## 2022-03-27 LAB — CBC
HCT: 37.2 % (ref 36.0–46.0)
Hemoglobin: 11.4 g/dL — ABNORMAL LOW (ref 12.0–15.0)
MCH: 30.9 pg (ref 26.0–34.0)
MCHC: 30.6 g/dL (ref 30.0–36.0)
MCV: 100.8 fL — ABNORMAL HIGH (ref 80.0–100.0)
Platelets: 240 10*3/uL (ref 150–400)
RBC: 3.69 MIL/uL — ABNORMAL LOW (ref 3.87–5.11)
RDW: 16.2 % — ABNORMAL HIGH (ref 11.5–15.5)
WBC: 8.6 10*3/uL (ref 4.0–10.5)
nRBC: 0 % (ref 0.0–0.2)

## 2022-03-27 LAB — TROPONIN I (HIGH SENSITIVITY): Troponin I (High Sensitivity): 30 ng/L — ABNORMAL HIGH (ref <18)

## 2022-03-27 MED ORDER — CHLORHEXIDINE GLUCONATE CLOTH 2 % EX PADS
6.0000 | MEDICATED_PAD | Freq: Every day | CUTANEOUS | Status: DC
  Administered 2022-03-29 – 2022-04-01 (×4): 6 via TOPICAL
  Filled 2022-03-27: qty 6

## 2022-03-27 MED ORDER — LABETALOL HCL 5 MG/ML IV SOLN
10.0000 mg | Freq: Once | INTRAVENOUS | Status: AC
  Administered 2022-03-27: 10 mg via INTRAVENOUS
  Filled 2022-03-27: qty 4

## 2022-03-27 MED ORDER — LORAZEPAM 2 MG/ML IJ SOLN
1.0000 mg | Freq: Once | INTRAMUSCULAR | Status: AC
  Administered 2022-03-27: 1 mg via INTRAVENOUS
  Filled 2022-03-27: qty 1

## 2022-03-27 MED ORDER — NITROGLYCERIN 2 % TD OINT
1.0000 [in_us] | TOPICAL_OINTMENT | Freq: Once | TRANSDERMAL | Status: AC
  Administered 2022-03-27: 1 [in_us] via TOPICAL
  Filled 2022-03-27: qty 1

## 2022-03-27 MED ORDER — MORPHINE SULFATE (PF) 4 MG/ML IV SOLN
4.0000 mg | Freq: Once | INTRAVENOUS | Status: AC
  Administered 2022-03-27: 4 mg via INTRAVENOUS
  Filled 2022-03-27: qty 1

## 2022-03-27 MED ORDER — FENTANYL CITRATE PF 50 MCG/ML IJ SOSY
100.0000 ug | PREFILLED_SYRINGE | Freq: Once | INTRAMUSCULAR | Status: AC
  Administered 2022-03-27: 100 ug via INTRAVENOUS
  Filled 2022-03-27: qty 2

## 2022-03-27 MED ORDER — LABETALOL HCL 5 MG/ML IV SOLN
20.0000 mg | Freq: Once | INTRAVENOUS | Status: AC
  Administered 2022-03-27: 20 mg via INTRAVENOUS
  Filled 2022-03-27: qty 4

## 2022-03-27 NOTE — ED Notes (Signed)
Pt states fentanyl helped but has gone now. MD aware.

## 2022-03-27 NOTE — ED Notes (Signed)
Pt lives at home alone and takes care of her ADLs but does not drive. She is stating she cannot get out of recliner in bed. Slumped over and tried to slide down when staff getting her in bed.

## 2022-03-27 NOTE — ED Triage Notes (Signed)
Pt comes with c/o belly pain for last two hours and syncopal episode. Pt states dizziness. Pt does have swelling in ankles and states generalized weakness.  BP-247/91  EMS reports peaked T waves and muscle spasms to left side. Pt has MWF dialysis and has not missed any treatments.

## 2022-03-27 NOTE — H&P (Incomplete)
History and Physical    Tracy Jennings VFI:[id number] DOB: 12/14/1955 DOA: 03/27/2022  PCP: Sharyne Peach, MD  Patient coming from: home  I have personally briefly reviewed patient's old medical records in [place]  Chief Complaint: HA/stomach cramping and leg cramping x1 days HPI: Tracy Jennings is a 66 y.o. female with medical history significant of  ESRDMWT, DM, HTN ,cervical cancer s/p surgery chemo /rads, pancreatitis, pvd who presents to ed with complaint of  HA and belly pain/leg pain due to muscle spasms/cramping.. Patient also noted nausea, but denies sob, chest pain, fever or chills. She does sinus drainage with her presenting symptoms. Patient notes compliance with her medications.  Due to persistent symptoms over the last day patient presented to ED.   ED Course:  In Ed patient was found to have uncontrolled blood pressure  Tx with iv medication with noted improvement and resolution of presenting complaints. Patient still with elevated bp is admitted for further treatment  as well as HD for better blood pressure control. Dr Candiss Norse was contacted.  Plan for HD in am .  Afeb, bp 216/69, hr 91, sat 100% rr 16 EKG: nsr , LAD, LVH, t wave inversion in inferior leads Labs:  Wbc 8.6, hgb 11.4 mcv 100.8 Lipase 31 NA 138, K 4.8, glu 125, cr 9.36, ag 17 CE 30 Tx fenatnyl, labetolol, ativan , nitro paste  Cxr : pulmonary edema Review of Systems: As per HPI otherwise 10 point review of systems negative.   Past Medical History:  Diagnosis Date   Anemia 04/2018   low iron. to be started on supplements   Cervical cancer ([place])    CKD (chronic kidney disease)    Stage IV   Complication of anesthesia    receceived too much anesthesia, that she was in coma for a couple days    COVID-19 virus detected 10/28/2019   Diabetes mellitus without complication (HCC)    type II   ESRD (end stage renal disease) ([place])    Heart murmur    followed as a child only   HSIL (high  grade squamous intraepithelial lesion) on Pap smear of cervix    Hyperlipidemia associated with type 2 diabetes mellitus ([place])    Hypertension    Pancreatitis    Peripheral vascular disease ([place])     Past Surgical History:  Procedure Laterality Date   AMPUTATION TOE Left 2013   2nd toe. tip of toe (toe nail was infected)   AV FISTULA PLACEMENT Left 05/11/2018   Procedure: ARTERIOVENOUS (AV) FISTULA CREATION;  Surgeon: Katha Cabal, MD;  Location: [facility];  Service: Vascular;  Laterality: Left;   CATARACT EXTRACTION     CERVICAL CONIZATION W/BX N/A 04/08/2020   Procedure: CONIZATION CERVIX WITH BIOPSY;  Surgeon: Mellody Drown, MD;  Location: [facility];  Service: Gynecology;  Laterality: N/A;   CHOLECYSTECTOMY  2014   COLONOSCOPY     COLONOSCOPY WITH PROPOFOL N/A 01/10/2018   Procedure: COLONOSCOPY WITH PROPOFOL;  Surgeon: Toledo, Benay Pike, MD;  Location: [facility];  Service: Gastroenterology;  Laterality: N/A;   DIALYSIS/PERMA CATHETER INSERTION N/A 05/21/2018   Procedure: DIALYSIS/PERMA CATHETER INSERTION;  Surgeon: Algernon Huxley, MD;  Location: [facility];  Service: Cardiovascular;  Laterality: N/A;   DIALYSIS/PERMA CATHETER INSERTION N/A 07/14/2020   Procedure: DIALYSIS/PERMA CATHETER INSERTION;  Surgeon: Algernon Huxley, MD;  Location: [facility];  Service: Cardiovascular;  Laterality: N/A;   DIALYSIS/PERMA CATHETER REMOVAL N/A 04/11/2019  Procedure: DIALYSIS/PERMA CATHETER REMOVAL;  Surgeon: Algernon Huxley, MD;  Location: [facility];  Service: Cardiovascular;  Laterality: N/A;   EYE SURGERY Bilateral 2018   cataract extractions   IR IMAGING GUIDED PORT INSERTION  06/05/2020   THROMBECTOMY W/ EMBOLECTOMY  05/11/2018   Procedure: THROMBECTOMY ARTERIOVENOUS FISTULA;  Surgeon: Katha Cabal, MD;  Location: [facility];  Service: Vascular;;   TUBAL LIGATION  1984     reports that she has never smoked. She has never used smokeless tobacco. She reports  that she does not drink alcohol and does not use drugs.  Allergies  Allergen Reactions   Amlodipine Swelling    Knees down to ankles Knees down to ankles   Hctz [Hydrochlorothiazide]     pancreatitis   Heparin Other (See Comments)    Pt reports cardiac arrest when given heparin   Lmw Heparin     Reports cardiac arrest when given heparin   Other Other (See Comments)    Reports cardiac arrest when given during surgery Reports cardiac arrest when given during surgery Reports cardiac arrest when given during surgery    Sulfa Antibiotics Rash   Dairy Aid [Tilactase]     Runny nose   Hydralazine Nausea Only    Family History  Problem Relation Age of Onset   Stroke Mother    Hypertension Mother    Gout Mother    Cancer Father    Diabetes Sister    Diabetes Maternal Grandmother    Diabetes Maternal Grandfather    Diabetes Paternal Grandmother    Diabetes Paternal Grandfather     Prior to Admission medications   Medication Sig Start Date End Date Taking? Authorizing Provider  amLODipine (NORVASC) 10 MG tablet Take 10 mg by mouth daily. 12/01/20  Yes [provider]  doxazosin (CARDURA) 4 MG tablet Take 4 mg by mouth daily. 07/01/19  Yes [provider]  losartan (COZAAR) 100 MG tablet SMARTSIG:1 Tablet(s) By Mouth Every Evening 02/03/22  Yes [provider]  promethazine (PHENERGAN) 25 MG tablet Take 25 mg by mouth every 6 (six) hours as needed for nausea or vomiting.   Yes [provider]  diltiazem (CARDIZEM CD) 120 MG 24 hr capsule Take 120 mg by mouth daily. 09/26/19 10/29/19  [provider]    Physical Exam: Vitals:   03/27/22 2100 03/27/22 2130 03/27/22 2200 03/27/22 2320  BP: (!) 213/85 (!) 208/74 (!) 224/85 (!) 203/95  Pulse: 86 87  86  Resp: 12 20    Temp:      SpO2: 99% (!) 21%  93%  Weight:      Height:         Vitals:   03/27/22 2100 03/27/22 2130 03/27/22 2200 03/27/22 2320  BP: (!) 213/85 (!) 208/74 (!) 224/85  (!) 203/95  Pulse: 86 87  86  Resp: 12 20    Temp:      SpO2: 99% (!) 21%  93%  Weight:      Height:      Constitutional: NAD, calm, comfortable, noted to be fatigued Eyes: PERRL, lids and conjunctivae normal ENMT: Mucous membranes are moist. Posterior pharynx clear of any exudate or lesions.Normal dentition.  Neck: normal, supple, no masses, no thyromegaly Respiratory: clear to auscultation bilaterally, no wheezing, no crackles. Normal respiratory effort. No accessory muscle use.  Cardiovascular: Regular rate and rhythm, + murmurs / rubs / gallops. Trace +1 extremity edema. 2+ pedal pulses. Abdomen: no tenderness, no masses palpated. No hepatosplenomegaly.  Bowel sounds positive.  Musculoskeletal: no clubbing / cyanosis. No joint deformity upper and lower extremities. Good ROM, no contractures. Normal muscle tone.  Skin: no rashes, lesions, ulcers. No induration Neurologic: CN 2-12 grossly intact. Sensation intact, Strength 5/5 in all 4.  Psychiatric: Normal judgment and insight. Alert and oriented x 3. Normal mood.    Labs on Admission: I have personally reviewed following labs and imaging studies  CBC: Recent Labs  Lab 03/27/22 2030  WBC 8.6  HGB 11.4*  HCT 37.2  MCV 100.8*  PLT 941   Basic Metabolic Panel: Recent Labs  Lab 03/27/22 2030  NA 138  K 4.8  CL 99  CO2 22  GLUCOSE 125*  BUN 62*  CREATININE 8.36*  CALCIUM 8.8*   GFR: Estimated Creatinine Clearance: 5.6 mL/min (A) (by C-G formula based on SCr of 8.36 mg/dL (H)). Liver Function Tests: Recent Labs  Lab 03/27/22 2030  AST 18  ALT 13  ALKPHOS 60  BILITOT 0.8  PROT 7.6  ALBUMIN 4.0   Recent Labs  Lab 03/27/22 2030  LIPASE 31   No results for input(s): "AMMONIA" in the last 168 hours. Coagulation Profile: No results for input(s): "INR", "PROTIME" in the last 168 hours. Cardiac Enzymes: No results for input(s): "CKTOTAL", "CKMB", "CKMBINDEX", "TROPONINI" in the last 168 hours. BNP (last 3  results) No results for input(s): "PROBNP" in the last 8760 hours. HbA1C: No results for input(s): "HGBA1C" in the last 72 hours. CBG: No results for input(s): "GLUCAP" in the last 168 hours. Lipid Profile: No results for input(s): "CHOL", "HDL", "LDLCALC", "TRIG", "CHOLHDL", "LDLDIRECT" in the last 72 hours. Thyroid Function Tests: No results for input(s): "TSH", "T4TOTAL", "FREET4", "T3FREE", "THYROIDAB" in the last 72 hours. Anemia Panel: No results for input(s): "VITAMINB12", "FOLATE", "FERRITIN", "TIBC", "IRON", "RETICCTPCT" in the last 72 hours. Urine analysis:    Component Value Date/Time   COLORURINE YELLOW (A) 10/31/2017 1959   APPEARANCEUR HAZY (A) 10/31/2017 1959   APPEARANCEUR Hazy 12/09/2013 1046   LABSPEC 1.017 10/31/2017 1959   LABSPEC 1.031 12/09/2013 1046   PHURINE 6.0 10/31/2017 1959   GLUCOSEU >=500 (A) 10/31/2017 1959   GLUCOSEU >=500 12/09/2013 1046   HGBUR NEGATIVE 10/31/2017 [place] NEGATIVE 10/31/2017 1959   BILIRUBINUR Negative 12/09/2013 1046   [place] 10/31/2017 1959   PROTEINUR >=300 (A) 10/31/2017 1959   NITRITE NEGATIVE 10/31/2017 1959   LEUKOCYTESUR NEGATIVE 10/31/2017 1959   LEUKOCYTESUR Negative 12/09/2013 1046    Radiological Exams on Admission: DG Chest Portable 1 View  Result Date: 03/27/2022 CLINICAL DATA:  Shortness of breath.  End-stage renal disease EXAM: PORTABLE CHEST 1 VIEW COMPARISON:  October 29, 2019 FINDINGS: A right Port-A-Cath terminates in the central SVC. No pneumothorax. Bilateral pulmonary opacities are largely interstitial and favored to represent edema given history. The cardiomediastinal silhouette is normal. No pneumothorax. No nodules or masses. IMPRESSION: Probable pulmonary edema. Electronically Signed   By: Dorise Bullion III M.D.   On: 03/27/2022 20:38    EKG: Independently reviewed. See above  Assessment/Plan  HTN Urgency  -admit to progressive care  -resume oral medication -noted  improvement with nitropaste, labetolol -prn nitro paste , labetolol  -plan for HD in am , renal Dr Candiss Norse consulted    Pulmonary Edema  -patient due to for HD/ -in background of HTN urgency -patient no complaints of sob  -no grossly overloaded on exam  -HD in am    HA/Abdominal pain  -presumed to be related to uncontrolled HTN -  resolved with improvement in bp -supportive care   EKG changes -due to uncontrolled blood pressure -cycle ce , repeat EKG in am   ESRDMWT -noted compliance    DM -diet controlled -fs iss   Cervical cancer -followed by oncology /gyn  PVD -no active issues  -resume home regimen   DVT prophylaxis: scd Code Status:full Family Communication: none at bedsdie Disposition Plan: patient  expected to be admitted greater than 2 midnights  Consults called: renal Singh Admission status: inpatient    Clance Boll MD [facility]   If 7PM-7AM, please contact night-coverage [email or web address] Password [facility]  03/27/2022, 11:38 PM

## 2022-03-27 NOTE — ED Triage Notes (Signed)
Patient reports severe headache, stomach feet and left leg spasm. Reports severe head pain, uncoordinated with left arm, mild left arm drift. Clear speech no facial droop.

## 2022-03-27 NOTE — ED Provider Notes (Addendum)
[facility] Provider Note    Event Date/Time   First MD Initiated Contact with Patient 03/27/22 2006     (approximate)  History   Chief Complaint: Abdominal Pain and Headache  HPI  Tracy Jennings is a 66 y.o. female with a past medical history of anemia, ESRD on HD, hypertension, hyperlipidemia, presents emergency department for abdominal pain headache and weakness.  According to the patient since lunchtime today she has been experiencing pain around the abdomen and she states a possible syncopal event earlier.  Patient states she has been somewhat dizzy with a fairly significant headache.  Patient states she had last had hemodialysis this past Thursday.  Patient's blood pressure on arrival 247/91.  Patient states she is purely dialysis dependent and does not make any urine.  Physical Exam   Triage Vital Signs: ED Triage Vitals  Enc Vitals Group     BP 03/27/22 1917 (!) 216/69     Pulse Rate 03/27/22 1917 91     Resp 03/27/22 1917 16     Temp 03/27/22 1917 98.2 F (36.8 C)     Temp src --      SpO2 03/27/22 1917 100 %     Weight 03/27/22 1918 137 lb (62.1 kg)     Height 03/27/22 1918 '5\' 3"'$  (1.6 m)     Head Circumference --      Peak Flow --      Pain Score 03/27/22 1840 10     Pain Loc --      Pain Edu? --      Excl. in [place]? --     Most recent vital signs: Vitals:   03/27/22 1917  BP: (!) 216/69  Pulse: 91  Resp: 16  Temp: 98.2 F (36.8 C)  SpO2: 100%    General: Awake, no distress.  CV:  Good peripheral perfusion.  Regular rate and rhythm  Resp:  Normal effort.  Equal breath sounds bilaterally.  Abd:  No distention.  Soft, nontender.  No rebound or guarding. Other:  Appears to have equal grip strength bilaterally.  No obvious cranial nerve deficit.  Patient does have somewhat slow responses throughout my evaluation.   ED Results / Procedures / Treatments   EKG  EKG viewed and interpreted by myself shows a normal sinus rhythm at 94  bpm with a narrow QRS, left axis deviation, largely normal intervals nonspecific but no concerning ST changes.  RADIOLOGY  I reviewed the chest x-ray findings on my interpretation of the images no significant acute abnormality patient does appear to have a port on the right chest. Radiology is read pulmonary edema   MEDICATIONS ORDERED IN ED: Medications  fentaNYL (SUBLIMAZE) injection 100 mcg (has no administration in time range)  labetalol (NORMODYNE) injection 20 mg (has no administration in time range)     IMPRESSION / MDM / ASSESSMENT AND PLAN / ED COURSE  I reviewed the triage vital signs and the nursing notes.  Patient's presentation is most consistent with acute presentation with potential threat to life or bodily function.  Patient presents emergency department with multiple complaints including dizziness, possible syncopal episode earlier, headache, abdominal pain.  Patient found to be significantly hypertensive initially 947 systolic.  Given the patient's headache and significant hypertension I discussed with the patient the need to obtain CT imaging of the head to rule out any bleed.  Patient is adamantly against going into a CT scanner.  I discussed with the patient that the CT  scanner is only seconds that she has to hold still.  Patient understands but still is adamantly against CT imaging.  States she has severe claustrophobia and cannot go on the tube even for 1 second.  Patient does have mild epigastric tenderness to palpation otherwise benign abdomen.  We will check labs, we will treat the patient's significant hypertension with IV labetalol we will obtain a chest x-ray to evaluate for fluid.  We will treat the patient's pain with 100 mcg of fentanyl and reassess once more pain controlled.  I did stressed to the patient my concerns regarding her significant hypertension and headache but she again is adamantly against CT imaging.  I discussed with the patient that I could not  rule out a bleed in the head or significant intra-abdominal finding without CT imaging, patient understands but still wishes to avoid CT imaging.  Differential would include hypertension, fluid overload, ICH, press, CVA, intra-abdominal pathology, gastritis, pancreatitis, biliary pathology.  Lab work is pending.  Reassuringly patient is satting 100% on room air with a normal respiratory rate.  Patient continues to complain of pain.  Patient given pain medication.  Patient's chemistry shows a normal potassium no significant finding given the patient's end-stage renal disease.  Lipase is normal.  CBC is unchanged.  Mild troponin elevation likely related to the patient's end-stage renal disease.  Patient's chest x-ray does show signs of pulmonary edema.  Patient remains significantly hypertensive despite IV labetalol and nitroglycerin ointment.  Currently 213/85.  Given the patient's persistent hypertension pain and signs of fluid overload on chest x-ray we will speak with nephrology regarding dialysis.  Patient will likely require admission to the hospitalist team for ongoing work-up and treatment.  I spoke with Dr. Candiss Norse of nephrology regarding the patient's continued hypertension and chest x-ray showing fluid overload and continued headache and pain.  We will admit to the hospital service with plan to dialyze in the morning.  FINAL CLINICAL IMPRESSION(S) / ED DIAGNOSES   Abdominal pain Headache Hypertension Fluid overload   Note:  This document was prepared using Dragon voice recognition software and may include unintentional dictation errors.   Harvest Dark, MD 03/27/22 2250    Harvest Dark, MD 03/27/22 2310

## 2022-03-28 ENCOUNTER — Encounter: Payer: Self-pay | Admitting: Internal Medicine

## 2022-03-28 DIAGNOSIS — N186 End stage renal disease: Secondary | ICD-10-CM

## 2022-03-28 DIAGNOSIS — R9431 Abnormal electrocardiogram [ECG] [EKG]: Secondary | ICD-10-CM

## 2022-03-28 DIAGNOSIS — I161 Hypertensive emergency: Secondary | ICD-10-CM

## 2022-03-28 DIAGNOSIS — D638 Anemia in other chronic diseases classified elsewhere: Secondary | ICD-10-CM | POA: Diagnosis not present

## 2022-03-28 LAB — HIV ANTIBODY (ROUTINE TESTING W REFLEX): HIV Screen 4th Generation wRfx: NONREACTIVE

## 2022-03-28 LAB — COMPREHENSIVE METABOLIC PANEL
ALT: 26 U/L (ref 0–44)
AST: 37 U/L (ref 15–41)
Albumin: 3.9 g/dL (ref 3.5–5.0)
Alkaline Phosphatase: 63 U/L (ref 38–126)
Anion gap: 15 (ref 5–15)
BUN: 66 mg/dL — ABNORMAL HIGH (ref 8–23)
CO2: 23 mmol/L (ref 22–32)
Calcium: 8.5 mg/dL — ABNORMAL LOW (ref 8.9–10.3)
Chloride: 101 mmol/L (ref 98–111)
Creatinine, Ser: 9.06 mg/dL — ABNORMAL HIGH (ref 0.44–1.00)
GFR, Estimated: 4 mL/min — ABNORMAL LOW (ref >60)
Glucose, Bld: 93 mg/dL (ref 70–99)
Potassium: 4.6 mmol/L (ref 3.5–5.1)
Sodium: 139 mmol/L (ref 135–145)
Total Bilirubin: 0.6 mg/dL (ref 0.3–1.2)
Total Protein: 7.4 g/dL (ref 6.5–8.1)

## 2022-03-28 LAB — TROPONIN I (HIGH SENSITIVITY): Troponin I (High Sensitivity): 35 ng/L — ABNORMAL HIGH (ref <18)

## 2022-03-28 LAB — HEPATITIS B CORE ANTIBODY, IGM: Hep B C IgM: NONREACTIVE

## 2022-03-28 LAB — CBC
HCT: 35.1 % — ABNORMAL LOW (ref 36.0–46.0)
Hemoglobin: 10.7 g/dL — ABNORMAL LOW (ref 12.0–15.0)
MCH: 30.8 pg (ref 26.0–34.0)
MCHC: 30.5 g/dL (ref 30.0–36.0)
MCV: 101.2 fL — ABNORMAL HIGH (ref 80.0–100.0)
Platelets: 234 10*3/uL (ref 150–400)
RBC: 3.47 MIL/uL — ABNORMAL LOW (ref 3.87–5.11)
RDW: 16.4 % — ABNORMAL HIGH (ref 11.5–15.5)
WBC: 7.6 10*3/uL (ref 4.0–10.5)
nRBC: 0 % (ref 0.0–0.2)

## 2022-03-28 LAB — HEPATITIS B SURFACE ANTIGEN
Hepatitis B Surface Ag: NONREACTIVE
Hepatitis B Surface Ag: NONREACTIVE

## 2022-03-28 LAB — HEPATITIS B CORE ANTIBODY, TOTAL: Hep B Core Total Ab: NONREACTIVE

## 2022-03-28 MED ORDER — DOXAZOSIN MESYLATE 4 MG PO TABS
4.0000 mg | ORAL_TABLET | Freq: Every day | ORAL | Status: DC
  Administered 2022-03-28 – 2022-04-01 (×5): 4 mg via ORAL
  Filled 2022-03-28 (×5): qty 1

## 2022-03-28 MED ORDER — HEPARIN SODIUM (PORCINE) 1000 UNIT/ML DIALYSIS: 1000.0000 [IU] | INTRAMUSCULAR | Status: DC | PRN

## 2022-03-28 MED ORDER — LIDOCAINE-PRILOCAINE 2.5-2.5 % EX CREA: 1.0000 | TOPICAL_CREAM | CUTANEOUS | Status: DC | PRN

## 2022-03-28 MED ORDER — ACETAMINOPHEN 325 MG PO TABS: 650.0000 mg | ORAL_TABLET | Freq: Four times a day (QID) | ORAL | Status: DC | PRN

## 2022-03-28 MED ORDER — LIDOCAINE HCL (PF) 1 % IJ SOLN: 5.0000 mL | INTRAMUSCULAR | Status: DC | PRN

## 2022-03-28 MED ORDER — ALTEPLASE 2 MG IJ SOLR
2.0000 mg | Freq: Once | INTRAMUSCULAR | Status: DC | PRN
Start: 2022-03-28 — End: 2022-04-01

## 2022-03-28 MED ORDER — PENTAFLUOROPROP-TETRAFLUOROETH EX AERO: 1.0000 | INHALATION_SPRAY | CUTANEOUS | Status: DC | PRN

## 2022-03-28 MED ORDER — AMLODIPINE BESYLATE 10 MG PO TABS
10.0000 mg | ORAL_TABLET | Freq: Every day | ORAL | Status: DC
  Administered 2022-03-28 – 2022-04-01 (×5): 10 mg via ORAL
  Filled 2022-03-28 (×5): qty 1

## 2022-03-28 MED ORDER — LOSARTAN POTASSIUM 50 MG PO TABS
100.0000 mg | ORAL_TABLET | Freq: Every day | ORAL | Status: DC
  Administered 2022-03-28 – 2022-03-30 (×3): 100 mg via ORAL
  Filled 2022-03-28 (×3): qty 2

## 2022-03-28 MED ORDER — HEPARIN SODIUM (PORCINE) 1000 UNIT/ML DIALYSIS: 20.0000 [IU]/kg | INTRAMUSCULAR | Status: DC | PRN

## 2022-03-28 MED ORDER — ANTICOAGULANT SODIUM CITRATE 4% (200MG/5ML) IV SOLN: 5.0000 mL | Status: DC | PRN

## 2022-03-28 MED ORDER — MORPHINE SULFATE (PF) 2 MG/ML IV SOLN
2.0000 mg | INTRAVENOUS | Status: DC | PRN
  Administered 2022-03-28: 2 mg via INTRAVENOUS
  Filled 2022-03-28 (×2): qty 1

## 2022-03-28 MED ORDER — LABETALOL HCL 5 MG/ML IV SOLN
10.0000 mg | INTRAVENOUS | Status: DC | PRN
Start: 2022-03-28 — End: 2022-04-01
  Administered 2022-03-29 – 2022-03-30 (×4): 10 mg via INTRAVENOUS
  Filled 2022-03-28 (×4): qty 4

## 2022-03-28 MED ORDER — ACETAMINOPHEN 650 MG RE SUPP: 650.0000 mg | Freq: Four times a day (QID) | RECTAL | Status: DC | PRN

## 2022-03-28 MED ORDER — ONDANSETRON HCL 4 MG/2ML IJ SOLN
4.0000 mg | Freq: Four times a day (QID) | INTRAMUSCULAR | Status: DC | PRN
  Administered 2022-03-28 – 2022-03-29 (×2): 4 mg via INTRAVENOUS
  Filled 2022-03-28 (×3): qty 2

## 2022-03-28 MED ORDER — ONDANSETRON HCL 4 MG PO TABS: 4.0000 mg | ORAL_TABLET | Freq: Four times a day (QID) | ORAL | Status: DC | PRN

## 2022-03-28 MED ORDER — ALBUTEROL SULFATE (2.5 MG/3ML) 0.083% IN NEBU: 2.5000 mg | INHALATION_SOLUTION | RESPIRATORY_TRACT | Status: DC | PRN

## 2022-03-28 NOTE — Progress Notes (Signed)
Received patient in bed, alert and oriented. Informed consent signed and in chart.  Time tx completed: 1415  HD treatment completed. Patient tolerated well. Fistula/Graft/HD catheter without signs and symptoms of complications. Patient transported back to the room, alert and orient and in no acute distress. Report given to bedside RN.  Total UF removed: 0.8L  Medication given: none   Post HD VS:224/76 (104), HR-86 Hr-86 [id number]   Post HD weight: 63.7kg

## 2022-03-28 NOTE — Progress Notes (Signed)
[facility]  ROUNDING NOTE   Subjective:   Tracy Jennings is a 66 year old female with past medical history including hypertension, diabetes, pancreatitis, cervical cancer, and end-stage renal disease on hemodialysis.  Patient presents to the emergency department from home with complaints of headache and generalized pain.  Patient is admitted under observation for Hypertensive urgency [I16.0] Nonintractable headache, unspecified chronicity pattern, unspecified headache type [R51.9] Hypervolemia, unspecified hypervolemia type [E87.70] Hypertension, unspecified type [I10]  Patient is known to our practice and receives outpatient dialysis treatments at [facility] on a MWF schedule, supervised by Dr. Candiss Norse.  Patient did receive a full treatment this past Friday with no complications.  Patient is currently confused at times.  Chart review shows patient reportedly found naked, repeatedly asking where she is, why she is here.  Patiently currently seen standing at bedside.  Repeatedly states she is ready for discharge.  Denies pain or discomfort at this time.  States she feels much better than when arriving.  Reports nausea and vomiting however states that has improved.  Denies missing any recent treatments.  Labs on arrival include sodium 138, potassium 4.8, glucose 125, BUN 62, creatinine 8.36 with GFR 5, calcium 8.7, troponin 30, and hemoglobin 11.4.  Chest x-ray shows probable pulmonary edema.  We have been consulted to evaluate dialysis needs.   Objective:  Vital signs in last 24 hours:  Temp:  [98 F (36.7 C)-98.8 F (37.1 C)] 98.6 F (37 C) (07/17 1525) Pulse Rate:  [72-91] 88 (07/17 1525) Resp:  [12-20] 18 (07/17 1525) BP: (157-224)/(69-95) 157/84 (07/17 1525) SpO2:  [21 %-100 %] 94 % (07/17 1525) Weight:  [62.1 kg-68 kg] 63.7 kg (07/17 1448)  Weight change:  Filed Weights   03/27/22 1918 03/28/22 1103 03/28/22 1448  Weight: 62.1 kg 68 kg 63.7 kg     Intake/Output: No intake/output data recorded.   Intake/Output this shift:  Total I/O In: -  Out: 800 [Other:800]  Physical Exam: General: NAD  Head: Normocephalic, atraumatic. Moist oral mucosal membranes  Eyes: Anicteric  Lungs:  Clear to auscultation normal effort  Heart: Regular rate and rhythm  Abdomen:  Soft, nontender  Extremities:  No peripheral edema.  Neurologic: Nonfocal, moving all four extremities  Skin: No lesions  Access: Lt Permcath    Basic Metabolic Panel: Recent Labs  Lab 03/27/22 2030 03/28/22 0516  NA 138 139  K 4.8 4.6  CL 99 101  CO2 22 23  GLUCOSE 125* 93  BUN 62* 66*  CREATININE 8.36* 9.06*  CALCIUM 8.8* 8.5*    Liver Function Tests: Recent Labs  Lab 03/27/22 2030 03/28/22 0516  AST 18 37  ALT 13 26  ALKPHOS 60 63  BILITOT 0.8 0.6  PROT 7.6 7.4  ALBUMIN 4.0 3.9   Recent Labs  Lab 03/27/22 2030  LIPASE 31   No results for input(s): "AMMONIA" in the last 168 hours.  CBC: Recent Labs  Lab 03/27/22 2030 03/28/22 0516  WBC 8.6 7.6  HGB 11.4* 10.7*  HCT 37.2 35.1*  MCV 100.8* 101.2*  PLT 240 234    Cardiac Enzymes: No results for input(s): "CKTOTAL", "CKMB", "CKMBINDEX", "TROPONINI" in the last 168 hours.  BNP: Invalid input(s): "POCBNP"  CBG: No results for input(s): "GLUCAP" in the last 168 hours.  Microbiology: Results for orders placed or performed during the hospital encounter of 07/14/20  SARS Coronavirus 2 by RT PCR (hospital order, performed in [facility] hospital lab) Nasopharyngeal Nasopharyngeal Swab     Status:  None   Collection Time: 07/14/20 11:15 AM   Specimen: Nasopharyngeal Swab  Result Value Ref Range Status   SARS Coronavirus 2 NEGATIVE NEGATIVE Final    Comment: (NOTE) SARS-CoV-2 target nucleic acids are NOT DETECTED.  The SARS-CoV-2 RNA is generally detectable in upper and lower respiratory specimens during the acute phase of infection. The lowest concentration of SARS-CoV-2 viral  copies this assay can detect is 250 copies / mL. A negative result does not preclude SARS-CoV-2 infection and should not be used as the sole basis for treatment or other patient management decisions.  A negative result may occur with improper specimen collection / handling, submission of specimen other than nasopharyngeal swab, presence of viral mutation(s) within the areas targeted by this assay, and inadequate number of viral copies (<250 copies / mL). A negative result must be combined with clinical observations, patient history, and epidemiological information.  Fact Sheet for Patients:   StrictlyIdeas.no  Fact Sheet for Healthcare Providers: BankingDealers.co.za  This test is not yet approved or  cleared by the [place] FDA and has been authorized for detection and/or diagnosis of SARS-CoV-2 by FDA under an Emergency Use Authorization (EUA).  This EUA will remain in effect (meaning this test can be used) for the duration of the COVID-19 declaration under Section 564(b)(1) of the Act, 21 U.S.C. section 360bbb-3(b)(1), unless the authorization is terminated or revoked sooner.  Performed at [facility], [address] [place], [place] [address]     Coagulation Studies: No results for input(s): "LABPROT", "INR" in the last 72 hours.  Urinalysis: No results for input(s): "COLORURINE", "LABSPEC", "PHURINE", "GLUCOSEU", "HGBUR", "BILIRUBINUR", "KETONESUR", "PROTEINUR", "UROBILINOGEN", "NITRITE", "LEUKOCYTESUR" in the last 72 hours.  Invalid input(s): "APPERANCEUR"    Imaging: DG Chest Portable 1 View  Result Date: 03/27/2022 CLINICAL DATA:  Shortness of breath.  End-stage renal disease EXAM: PORTABLE CHEST 1 VIEW COMPARISON:  October 29, 2019 FINDINGS: A right Port-A-Cath terminates in the central SVC. No pneumothorax. Bilateral pulmonary opacities are largely interstitial and favored to represent edema given  history. The cardiomediastinal silhouette is normal. No pneumothorax. No nodules or masses. IMPRESSION: Probable pulmonary edema. Electronically Signed   By: Dorise Bullion III M.D.   On: 03/27/2022 20:38     Medications:    anticoagulant sodium citrate      Chlorhexidine Gluconate Cloth  6 each Topical Q0600   acetaminophen **OR** acetaminophen, albuterol, alteplase, anticoagulant sodium citrate, heparin, heparin, labetalol, lidocaine (PF), lidocaine-prilocaine, morphine injection, ondansetron **OR** ondansetron (ZOFRAN) IV, pentafluoroprop-tetrafluoroeth  Assessment/ Plan:  [name] is a 66 y.o.  female with past medical history including hypertension, diabetes, pancreatitis, cervical cancer, and end-stage renal disease on hemodialysis.  Patient presents to the emergency department from home with complaints of headache and generalized pain.  Patient is admitted under observation for Hypertensive urgency [I16.0] Nonintractable headache, unspecified chronicity pattern, unspecified headache type [R51.9] Hypervolemia, unspecified hypervolemia type [E87.70] Hypertension, unspecified type [I10]   Anemia of chronic kidney disease Lab Results  Component Value Date   HGB 10.7 (L) 03/28/2022   Hemoglobin remains within acceptable range.  We will continue to monitor.  Patient receives [place] outpatient  2.  End-stage renal disease on hemodialysis.  Will maintain outpatient schedule as possible.  Patient received scheduled dialysis treatment today however treatment terminated early due to patient agitation and tablet malfunction.  We will assess morning labs to determine if additional treatment is needed tomorrow.  3. Secondary Hyperparathyroidism:  Lab Results  Component Value Date  CALCIUM 8.5 (L) 03/28/2022   PHOS 4.7 (H) 06/30/2020    Calcium and phosphorus remain within acceptable range.  We will continue to monitor.  4.  Hypertension with chronic kidney disease.  Home  regimen includes amlodipine, doxazosin, and losartan.  All currently held.  Patient received labetalol IV 10 mg as needed for systolic BP greater than 470.  Blood pressure elevated throughout dialysis, 224/76 at dialysis termination.  Patient however agitated throughout treatment.  Blood pressure 157/84 once return to unit.   LOS: 0   7/17/20233:43 PM

## 2022-03-28 NOTE — Progress Notes (Signed)
Knocking heard from inside room. This RN entered room to find patient naked with leads off and blood pressure cuff cord wrapped around footboard and tangled in blankets. Patient stated "I'm stuck, why aren't you helping me? Are you going to get me out of here?"   Patient helped back into bed, new gown and linen placed on patient, leads replaced, and family member now at bedside. Breakfast tray provided at this time.

## 2022-03-28 NOTE — Assessment & Plan Note (Signed)
Stable

## 2022-03-28 NOTE — Hospital Course (Addendum)
66 year old female with past medical history of end-stage renal disease on hemodialysis, hypertension, diabetes mellitus, peripheral vascular disease and pancreatitis who presented to the emergency room on 7/16 with complaints of headache and abdominal and leg pain/cramping for the past 1 to 2 days.  In the emergency room, patient found to have markedly elevated blood pressure of 468 systolic and an EKG noting T wave inversions in inferior leads.  Chest x-ray consistent with pulmonary edema from volume overload.  Nephrology consulted for hemodialysis which was started on 7/17, however this session was terminated early due to patient agitation as well as tablet malfunction.  Over the next 24 hours, patient continued to have malignant hypertension with blood pressures ranging from the 032Z to 224M systolic, along with complaining of intermittent headaches along with some blurry vision.  On 7/19, patient having more having some audio and visual hallucinations.  She also made indirect threats of harm saying that she would stab someone although she did not make any physical attempts.  Psychiatry consulted.  Following dialysis 7/19, blood pressures have been somewhat better ranging from 90s to 140s, with blood pressures 180s to 200s before dialysis.  With addition of medications, blood pressure much improved.  On day of discharge, patient did not receive her blood pressure medication before going to dialysis and blood pressures stayed elevated.  Patient felt to be stable for discharge and will be important for her to make sure that she takes her blood pressure medications, even on dialysis today.
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
## 2022-03-28 NOTE — Assessment & Plan Note (Addendum)
Patient states that she has been compliant with her dialysis.  Post dialysis, blood pressures have somewhat improved although still quite elevated.  Have restarted her home medications and may need additional medication support.  Patient declined being on hydralazine due to rash.  Showing signs of endorgan damage given headaches and episodes of worsening confusion.  Added Coreg and Imdur and this plus dialysis seems to have improved her blood pressure.  Plan moving forward will be for patient to make sure that she takes her blood pressure medications, even on dialysis days.

## 2022-03-28 NOTE — Assessment & Plan Note (Addendum)
Appreciate nephrology help.  Status post hemodialysis 7/17 (although had to be terminated early due to elevated blood pressure) and 7/19 and 7/21.  Patient will keep a Monday Wednesday Friday schedule.  Adding PhosLo to her discharge medication regimen.

## 2022-03-28 NOTE — Progress Notes (Signed)
Tablo error, requiring rinse back and lines to be changed. Patient is Alert and stable at this time, lines were changed and patient was hooked back up with out issue. Will continue to treat as prescribed,

## 2022-03-28 NOTE — Progress Notes (Signed)
Patient is stating she is upset and stressed, she has be reassured her lunch is coming and that we are doing everything we can to make her comfortable as possible and that everything will be okay. She has stated she may want to come off AMA soon. Attempts are being made to make her happy and comfortable as possible.

## 2022-03-28 NOTE — ED Notes (Signed)
Pt currently resting.

## 2022-03-28 NOTE — Assessment & Plan Note (Signed)
Appears to be improved as blood pressure improves.  No signs of obstruction.  Does not appear to be related to her chronic pancreatitis.

## 2022-03-28 NOTE — Assessment & Plan Note (Signed)
Recheck in morning.

## 2022-03-28 NOTE — Progress Notes (Addendum)
[facility] Progress Note  Patient: Tracy Jennings    YWV:[id number]  DOA: 03/27/2022    Date of Service: the patient was seen and examined on 03/28/2022  Brief hospital course: 66 year old female with past medical history of end-stage renal disease on hemodialysis, hypertension, diabetes mellitus, peripheral vascular disease and pancreatitis who presented to the emergency room on 7/16 with complaints of headache and abdominal and leg pain/cramping for the past 1 to 2 days.  In the emergency room, patient found to have markedly elevated blood pressure of 854 systolic and an EKG noting T wave inversions in inferior leads.  Chest x-ray consistent with pulmonary edema from volume overload.  Nephrology consulted for hemodialysis which was started on 7/17, however this session was terminated early due to patient agitation as well as tablet malfunction.  Assessment and Plan: Assessment and Plan: * Hypertensive emergency Patient states that she has been compliant with her dialysis.  Post dialysis, blood pressures have somewhat improved although still quite elevated.  Have restarted her home medications and may need additional medication support.  ESRD (end stage renal disease) [facility]) Appreciate nephrology help.  Status post hemodialysis 7/17  EKG, abnormal Recheck in morning.  Generalized abdominal pain Appears to be improved as blood pressure improves.  No signs of obstruction.  Does not appear to be related to her chronic pancreatitis.  Anemia of chronic disease Stable.       Body mass index is 24.88 kg/m.    Pressure Injury 06/26/20 Sacrum Mid Stage 2 -  Partial thickness loss of dermis presenting as a shallow open injury with a red, pink wound bed without slough. open pink area (Active)  06/26/20 1900  Location: Sacrum  Location Orientation: Mid  Staging: Stage 2 -  Partial thickness loss of dermis presenting as a shallow open injury with a red, pink wound bed without slough.   Wound Description (Comments): open pink area  Present on Admission:      Consultants: Nephrology  Procedures: Hemodialysis 7/17  Antimicrobials: None  Code Status: Full code   Subjective: Patient tired, currently denies pain  Objective: Vital signs were reviewed and unremarkable. Vitals:   03/28/22 1415 03/28/22 1525  BP: (!) 224/76 (!) 157/84  Pulse: 86 88  Resp:  18  Temp:  98.6 F (37 C)  SpO2: 98% 94%    Intake/Output Summary (Last 24 hours) at 03/28/2022 1721 Last data filed at 03/28/2022 1415 Gross per 24 hour  Intake --  Output 800 ml  Net -800 ml   Filed Weights   03/27/22 1918 03/28/22 1103 03/28/22 1448  Weight: 62.1 kg 68 kg 63.7 kg   Body mass index is 24.88 kg/m.  Exam:  General: Tired, oriented x2 HEENT: Normocephalic, atraumatic, mucous membranes appear slightly dry Cardiovascular: Regular rate and rhythm, S1-S2 Respiratory: Clear to auscultation bilaterally Abdomen: Soft, nontender, nondistended, positive bowel sounds Musculoskeletal: No clubbing or cyanosis, trace pitting edema Skin: No skin breaks, tears or lesions Psychiatry: Currently calm, but has done easily agitated Neurology: No focal deficits  Data Reviewed: Morning labs reviewed.  Hemoglobin at 10.7  Disposition:  Status is: Observation     Anticipated discharge date: 7/18  Remaining issues to be resolved so that patient can be discharged: Decision for additional dialysis, control of blood pressure   Family Communication: Left message for family DVT Prophylaxis: SCDs Start: 03/28/22 0224    Author: Annita Brod ,MD 03/28/2022 5:21 PM  To reach On-call, see care teams to locate the attending and  reach out via [email or web address]. Between 7PM-7AM, please contact night-coverage If you still have difficulty reaching the attending provider, please page the [facility] (Director on Call) for [facility] on amion for assistance.

## 2022-03-28 NOTE — Progress Notes (Signed)
Patient arrived from ED after dialysis. Stating she wants to leave and asking for phone to call her mom to pick her up.

## 2022-03-28 NOTE — Progress Notes (Signed)
Admission profile updated. ?

## 2022-03-28 NOTE — ED Notes (Signed)
Rn went in to update patient that we are waiting on orders. Pt's gown was off and blankets tossed on floor. RN placed new gown on her and gave her fresh blankets. She states she would rather get her dialysis at her place since MD there will get her pain under control. Rn asked what she gets and what works. She states she is unsure but gets at dialysis.

## 2022-03-28 NOTE — ED Notes (Signed)
Informed RN bed assigned 

## 2022-03-29 ENCOUNTER — Observation Stay: Payer: Medicare Other

## 2022-03-29 DIAGNOSIS — Z89422 Acquired absence of other left toe(s): Secondary | ICD-10-CM | POA: Diagnosis not present

## 2022-03-29 DIAGNOSIS — I160 Hypertensive urgency: Secondary | ICD-10-CM | POA: Diagnosis present

## 2022-03-29 DIAGNOSIS — Z9842 Cataract extraction status, left eye: Secondary | ICD-10-CM | POA: Diagnosis not present

## 2022-03-29 DIAGNOSIS — I161 Hypertensive emergency: Secondary | ICD-10-CM | POA: Diagnosis present

## 2022-03-29 DIAGNOSIS — F03918 Unspecified dementia, unspecified severity, with other behavioral disturbance: Secondary | ICD-10-CM | POA: Diagnosis present

## 2022-03-29 DIAGNOSIS — D631 Anemia in chronic kidney disease: Secondary | ICD-10-CM | POA: Diagnosis present

## 2022-03-29 DIAGNOSIS — E1169 Type 2 diabetes mellitus with other specified complication: Secondary | ICD-10-CM | POA: Diagnosis present

## 2022-03-29 DIAGNOSIS — Z8673 Personal history of transient ischemic attack (TIA), and cerebral infarction without residual deficits: Secondary | ICD-10-CM | POA: Diagnosis not present

## 2022-03-29 DIAGNOSIS — Z8541 Personal history of malignant neoplasm of cervix uteri: Secondary | ICD-10-CM | POA: Diagnosis not present

## 2022-03-29 DIAGNOSIS — R9431 Abnormal electrocardiogram [ECG] [EKG]: Secondary | ICD-10-CM | POA: Diagnosis present

## 2022-03-29 DIAGNOSIS — J811 Chronic pulmonary edema: Secondary | ICD-10-CM | POA: Diagnosis present

## 2022-03-29 DIAGNOSIS — E785 Hyperlipidemia, unspecified: Secondary | ICD-10-CM | POA: Diagnosis present

## 2022-03-29 DIAGNOSIS — I120 Hypertensive chronic kidney disease with stage 5 chronic kidney disease or end stage renal disease: Secondary | ICD-10-CM | POA: Diagnosis present

## 2022-03-29 DIAGNOSIS — Z9049 Acquired absence of other specified parts of digestive tract: Secondary | ICD-10-CM | POA: Diagnosis not present

## 2022-03-29 DIAGNOSIS — Z8616 Personal history of COVID-19: Secondary | ICD-10-CM | POA: Diagnosis not present

## 2022-03-29 DIAGNOSIS — E1122 Type 2 diabetes mellitus with diabetic chronic kidney disease: Secondary | ICD-10-CM | POA: Diagnosis present

## 2022-03-29 DIAGNOSIS — K861 Other chronic pancreatitis: Secondary | ICD-10-CM | POA: Diagnosis present

## 2022-03-29 DIAGNOSIS — E1151 Type 2 diabetes mellitus with diabetic peripheral angiopathy without gangrene: Secondary | ICD-10-CM | POA: Diagnosis present

## 2022-03-29 DIAGNOSIS — Z992 Dependence on renal dialysis: Secondary | ICD-10-CM | POA: Diagnosis not present

## 2022-03-29 DIAGNOSIS — N2581 Secondary hyperparathyroidism of renal origin: Secondary | ICD-10-CM | POA: Diagnosis present

## 2022-03-29 DIAGNOSIS — E8770 Fluid overload, unspecified: Secondary | ICD-10-CM | POA: Diagnosis present

## 2022-03-29 DIAGNOSIS — R519 Headache, unspecified: Secondary | ICD-10-CM | POA: Diagnosis present

## 2022-03-29 DIAGNOSIS — N186 End stage renal disease: Secondary | ICD-10-CM | POA: Diagnosis present

## 2022-03-29 DIAGNOSIS — Z9221 Personal history of antineoplastic chemotherapy: Secondary | ICD-10-CM | POA: Diagnosis not present

## 2022-03-29 DIAGNOSIS — D638 Anemia in other chronic diseases classified elsewhere: Secondary | ICD-10-CM | POA: Diagnosis not present

## 2022-03-29 DIAGNOSIS — Z923 Personal history of irradiation: Secondary | ICD-10-CM | POA: Diagnosis not present

## 2022-03-29 DIAGNOSIS — F0392 Unspecified dementia, unspecified severity, with psychotic disturbance: Secondary | ICD-10-CM | POA: Diagnosis present

## 2022-03-29 LAB — RENAL FUNCTION PANEL
Albumin: 3.5 g/dL (ref 3.5–5.0)
Anion gap: 9 (ref 5–15)
BUN: 54 mg/dL — ABNORMAL HIGH (ref 8–23)
CO2: 30 mmol/L (ref 22–32)
Calcium: 7.8 mg/dL — ABNORMAL LOW (ref 8.9–10.3)
Chloride: 104 mmol/L (ref 98–111)
Creatinine, Ser: 7.73 mg/dL — ABNORMAL HIGH (ref 0.44–1.00)
GFR, Estimated: 5 mL/min — ABNORMAL LOW (ref >60)
Glucose, Bld: 102 mg/dL — ABNORMAL HIGH (ref 70–99)
Phosphorus: 7.2 mg/dL — ABNORMAL HIGH (ref 2.5–4.6)
Potassium: 4.3 mmol/L (ref 3.5–5.1)
Sodium: 143 mmol/L (ref 135–145)

## 2022-03-29 LAB — HEPATITIS C ANTIBODY: HCV Ab: NONREACTIVE

## 2022-03-29 LAB — HEPATITIS B SURFACE ANTIBODY,QUALITATIVE: Hep B S Ab: REACTIVE — AB

## 2022-03-29 LAB — HEPATITIS B SURFACE ANTIBODY, QUANTITATIVE: Hep B S AB Quant (Post): >1000 m[IU]/mL (ref >9.9)

## 2022-03-29 LAB — HEPATITIS B CORE ANTIBODY, TOTAL: Hep B Core Total Ab: NONREACTIVE

## 2022-03-29 MED ORDER — LORAZEPAM 2 MG/ML IJ SOLN: 1.0000 mg | INTRAMUSCULAR | Status: DC

## 2022-03-29 MED ORDER — CALCIUM ACETATE (PHOS BINDER) 667 MG PO CAPS
667.0000 mg | ORAL_CAPSULE | Freq: Three times a day (TID) | ORAL | Status: DC
  Administered 2022-03-29 – 2022-04-01 (×7): 667 mg via ORAL
  Filled 2022-03-29 (×7): qty 1

## 2022-03-29 MED ORDER — LORAZEPAM 2 MG/ML IJ SOLN: 1.0000 mg | INTRAMUSCULAR | Status: DC | PRN

## 2022-03-29 MED ORDER — ISOSORBIDE MONONITRATE ER 30 MG PO TB24
30.0000 mg | ORAL_TABLET | Freq: Every day | ORAL | Status: DC
  Administered 2022-03-29: 30 mg via ORAL
  Filled 2022-03-29: qty 1

## 2022-03-29 MED ORDER — ISOSORBIDE MONONITRATE ER 30 MG PO TB24
30.0000 mg | ORAL_TABLET | Freq: Once | ORAL | Status: DC
  Filled 2022-03-29: qty 1

## 2022-03-29 MED ORDER — CARVEDILOL 25 MG PO TABS
25.0000 mg | ORAL_TABLET | Freq: Two times a day (BID) | ORAL | Status: DC
Start: 2022-03-29 — End: 2022-04-01
  Administered 2022-03-29 – 2022-04-01 (×6): 25 mg via ORAL
  Filled 2022-03-29 (×6): qty 1

## 2022-03-29 MED ORDER — LORAZEPAM 2 MG/ML IJ SOLN
1.0000 mg | Freq: Once | INTRAMUSCULAR | Status: AC | PRN
  Administered 2022-03-29: 1 mg via INTRAVENOUS
  Filled 2022-03-29: qty 1

## 2022-03-29 MED ORDER — HYDRALAZINE HCL 50 MG PO TABS
100.0000 mg | ORAL_TABLET | Freq: Three times a day (TID) | ORAL | Status: DC
  Filled 2022-03-29: qty 2

## 2022-03-29 MED ORDER — ISOSORBIDE MONONITRATE ER 60 MG PO TB24
60.0000 mg | ORAL_TABLET | Freq: Every day | ORAL | Status: DC
Start: 2022-03-30 — End: 2022-04-01
  Administered 2022-03-30 – 2022-04-01 (×3): 60 mg via ORAL
  Filled 2022-03-29 (×3): qty 1

## 2022-03-29 NOTE — Progress Notes (Signed)
[facility]  ROUNDING NOTE   Subjective:   Tracy Jennings is a 66 year old female with past medical history including hypertension, diabetes, pancreatitis, cervical cancer, and end-stage renal disease on hemodialysis.  Patient presents to the emergency department from home with complaints of headache and generalized pain.  Patient is admitted under observation for Hypertensive urgency [I16.0] Nonintractable headache, unspecified chronicity pattern, unspecified headache type [R51.9] Hypervolemia, unspecified hypervolemia type [E87.70] Hypertension, unspecified type [I10]  Patient is known to our practice and receives outpatient dialysis treatments at [facility] on a MWF schedule, supervised by Dr. Candiss Norse.    Update Patient sitting at side of bed Son at bedside Alert and oriented to self and place Son voices concerns of mother living alone, but states she will not entertain conversation about living will and living somewhere that provides more supervision.  Patient complaining of double vision   Objective:  Vital signs in last 24 hours:  Temp:  [98 F (36.7 C)-98.9 F (37.2 C)] 98.9 F (37.2 C) (07/18 0749) Pulse Rate:  [72-88] 82 (07/18 1300) Resp:  [16-18] 16 (07/18 0749) BP: (116-224)/(40-84) 162/65 (07/18 1300) SpO2:  [94 %-100 %] 97 % (07/18 1059) Weight:  [63.7 kg-68.8 kg] 64.8 kg (07/18 0451)  Weight change: 5.857 kg Filed Weights   03/28/22 1448 03/29/22 0424 03/29/22 0451  Weight: 63.7 kg 68.8 kg 64.8 kg    Intake/Output: I/O last 3 completed shifts: In: -  Out: 800 [Other:800]   Intake/Output this shift:  No intake/output data recorded.  Physical Exam: General: NAD  Head: Normocephalic, atraumatic. Moist oral mucosal membranes  Eyes: Anicteric  Lungs:  Clear to auscultation normal effort  Heart: Regular rate and rhythm  Abdomen:  Soft, nontender  Extremities:  No peripheral edema.  Neurologic: Nonfocal, moving all four  extremities  Skin: No lesions  Access: Lt Permcath    Basic Metabolic Panel: Recent Labs  Lab 03/27/22 2030 03/28/22 0516 03/29/22 0448  NA 138 139 143  K 4.8 4.6 4.3  CL 99 101 104  CO2 '22 23 30  '$ GLUCOSE 125* 93 102*  BUN 62* 66* 54*  CREATININE 8.36* 9.06* 7.73*  CALCIUM 8.8* 8.5* 7.8*  PHOS  --   --  7.2*     Liver Function Tests: Recent Labs  Lab 03/27/22 2030 03/28/22 0516 03/29/22 0448  AST 18 37  --   ALT 13 26  --   ALKPHOS 60 63  --   BILITOT 0.8 0.6  --   PROT 7.6 7.4  --   ALBUMIN 4.0 3.9 3.5    Recent Labs  Lab 03/27/22 2030  LIPASE 31    No results for input(s): "AMMONIA" in the last 168 hours.  CBC: Recent Labs  Lab 03/27/22 2030 03/28/22 0516  WBC 8.6 7.6  HGB 11.4* 10.7*  HCT 37.2 35.1*  MCV 100.8* 101.2*  PLT 240 234     Cardiac Enzymes: No results for input(s): "CKTOTAL", "CKMB", "CKMBINDEX", "TROPONINI" in the last 168 hours.  BNP: Invalid input(s): "POCBNP"  CBG: No results for input(s): "GLUCAP" in the last 168 hours.  Microbiology: Results for orders placed or performed during the hospital encounter of 07/14/20  SARS Coronavirus 2 by RT PCR (hospital order, performed in [facility] hospital lab) Nasopharyngeal Nasopharyngeal Swab     Status: None   Collection Time: 07/14/20 11:15 AM   Specimen: Nasopharyngeal Swab  Result Value Ref Range Status   SARS Coronavirus 2 NEGATIVE NEGATIVE Final    Comment: (  NOTE) SARS-CoV-2 target nucleic acids are NOT DETECTED.  The SARS-CoV-2 RNA is generally detectable in upper and lower respiratory specimens during the acute phase of infection. The lowest concentration of SARS-CoV-2 viral copies this assay can detect is 250 copies / mL. A negative result does not preclude SARS-CoV-2 infection and should not be used as the sole basis for treatment or other patient management decisions.  A negative result may occur with improper specimen collection / handling, submission of specimen  other than nasopharyngeal swab, presence of viral mutation(s) within the areas targeted by this assay, and inadequate number of viral copies (<250 copies / mL). A negative result must be combined with clinical observations, patient history, and epidemiological information.  Fact Sheet for Patients:   StrictlyIdeas.no  Fact Sheet for Healthcare Providers: BankingDealers.co.za  This test is not yet approved or  cleared by the [place] FDA and has been authorized for detection and/or diagnosis of SARS-CoV-2 by FDA under an Emergency Use Authorization (EUA).  This EUA will remain in effect (meaning this test can be used) for the duration of the COVID-19 declaration under Section 564(b)(1) of the Act, 21 U.S.C. section 360bbb-3(b)(1), unless the authorization is terminated or revoked sooner.  Performed at [facility], [address] [place], [place] [address]     Coagulation Studies: No results for input(s): "LABPROT", "INR" in the last 72 hours.  Urinalysis: No results for input(s): "COLORURINE", "LABSPEC", "PHURINE", "GLUCOSEU", "HGBUR", "BILIRUBINUR", "KETONESUR", "PROTEINUR", "UROBILINOGEN", "NITRITE", "LEUKOCYTESUR" in the last 72 hours.  Invalid input(s): "APPERANCEUR"    Imaging: DG Chest Portable 1 View  Result Date: 03/27/2022 CLINICAL DATA:  Shortness of breath.  End-stage renal disease EXAM: PORTABLE CHEST 1 VIEW COMPARISON:  October 29, 2019 FINDINGS: A right Port-A-Cath terminates in the central SVC. No pneumothorax. Bilateral pulmonary opacities are largely interstitial and favored to represent edema given history. The cardiomediastinal silhouette is normal. No pneumothorax. No nodules or masses. IMPRESSION: Probable pulmonary edema. Electronically Signed   By: Dorise Bullion III M.D.   On: 03/27/2022 20:38     Medications:    anticoagulant sodium citrate      amLODipine  10 mg Oral Daily    carvedilol  25 mg Oral BID WC   Chlorhexidine Gluconate Cloth  6 each Topical Q0600   doxazosin  4 mg Oral Daily   isosorbide mononitrate  30 mg Oral Once   [START ON 03/30/2022] isosorbide mononitrate  60 mg Oral Daily   losartan  100 mg Oral QPC supper   acetaminophen **OR** acetaminophen, albuterol, alteplase, anticoagulant sodium citrate, heparin, heparin, labetalol, lidocaine (PF), lidocaine-prilocaine, LORazepam, LORazepam, morphine injection, ondansetron **OR** ondansetron (ZOFRAN) IV, pentafluoroprop-tetrafluoroeth  Assessment/ Plan:  [name] is a 66 y.o.  female with past medical history including hypertension, diabetes, pancreatitis, cervical cancer, and end-stage renal disease on hemodialysis.  Patient presents to the emergency department from home with complaints of headache and generalized pain.  Patient is admitted under observation for Hypertensive urgency [I16.0] Nonintractable headache, unspecified chronicity pattern, unspecified headache type [R51.9] Hypervolemia, unspecified hypervolemia type [E87.70] Hypertension, unspecified type [I10]   Hypertension with chronic kidney disease.  Home regimen includes amlodipine, doxazosin, and losartan.  Restarted on all home medications. Improved control of blood pressure. Currently 162/65  Anemia of chronic kidney disease Lab Results  Component Value Date   HGB 10.7 (L) 03/28/2022   We will continue to monitor.  Patient receives [place] outpatient  3.  End-stage renal disease on hemodialysis.  Will maintain  outpatient schedule as possible.    Next treatment scheduled for Wednesday. Son encouraged to continue to discuss living will with mother and family as her mentation continues to decline.   4. Secondary Hyperparathyroidism:  Lab Results  Component Value Date   CALCIUM 7.8 (L) 03/29/2022   PHOS 7.2 (H) 03/29/2022    Calcium and phosphorus outside of desired target. Will order calcium acetate '667mg'$  with meals.       LOS: 0   7/18/20232:05 PM

## 2022-03-29 NOTE — Progress Notes (Signed)
  Chaplain On-Call responded to Spiritual Care Consult Order from RN Darryl Lent.  The request was for Advance Directives information for the patient.  Chaplain provided the AD documents and brief education for the patient and her son. The patient stated her understanding.  Chaplain Pollyann Samples M.Div., [facility]

## 2022-03-29 NOTE — Progress Notes (Signed)
[facility] Progress Note  Patient: Tracy Jennings    JKK:[id number]  DOA: 03/27/2022    Date of Service: the patient was seen and examined on 03/29/2022  Brief hospital course: 66 year old female with past medical history of end-stage renal disease on hemodialysis, hypertension, diabetes mellitus, peripheral vascular disease and pancreatitis who presented to the emergency room on 7/16 with complaints of headache and abdominal and leg pain/cramping for the past 1 to 2 days.  In the emergency room, patient found to have markedly elevated blood pressure of 716 systolic and an EKG noting T wave inversions in inferior leads.  Chest x-ray consistent with pulmonary edema from volume overload.  Nephrology consulted for hemodialysis which was started on 7/17, however this session was terminated early due to patient agitation as well as tablet malfunction.  Over the next 24 hours, patient continues to have malignant hypertension with blood pressures ranging from the 967E to 938B systolic.  She continues to complain of intermittent headaches along with some blurry vision.  Patient continues to have a slow decline in her overall mentation.    Assessment and Plan: Assessment and Plan: * Hypertensive emergency Patient states that she has been compliant with her dialysis.  Post dialysis, blood pressures have somewhat improved although still quite elevated.  Have restarted her home medications and may need additional medication support.  Patient declined being on hydralazine due to rash.  Have started Imdur and Coreg.  Showing signs of endorgan damage given headaches and episodes of worsening confusion  ESRD (end stage renal disease) ([place]) Appreciate nephrology help.  Status post hemodialysis 7/17  EKG, abnormal Little change from 1 on admission although T wave inversion in the inferior leads appears to be mostly resolved.  Generalized abdominal pain Appears to be improved as blood pressure improves.   No signs of obstruction.  Does not appear to be related to her chronic pancreatitis.  Anemia of chronic disease Stable.  Hyperlipidemia Continue statin  Headache Intermittent, secondary to uncontrolled hypertension.  No neurological findings of CVA, however patient has been hesitant to get CT scan of head, but was willing after as needed Ativan ordered.  Dementia with behavioral disturbance [facility]) Patient continues to have a slow decline in her mentation.  Likely vascular secondary to uncontrolled hypertensive episodes.  Nephrology has had discussions with the patient's son about a living will status.  At this point, his borderline with capacity but likely this could change within the next 6 months.       Body mass index is 25.31 kg/m.    Pressure Injury 06/26/20 Sacrum Mid Stage 2 -  Partial thickness loss of dermis presenting as a shallow open injury with a red, pink wound bed without slough. open pink area (Active)  06/26/20 1900  Location: Sacrum  Location Orientation: Mid  Staging: Stage 2 -  Partial thickness loss of dermis presenting as a shallow open injury with a red, pink wound bed without slough.  Wound Description (Comments): open pink area  Present on Admission:      Consultants: Nephrology  Procedures: Hemodialysis 7/17  Antimicrobials: None  Code Status: Full code   Subjective: Patient complains of headache and blurry vision  Objective: Vital signs were reviewed and unremarkable. Vitals:   03/29/22 1300 03/29/22 1409  BP: (!) 162/65 (!) 165/66  Pulse: 82 80  Resp:    Temp:    SpO2:     No intake or output data in the 24 hours ending 03/29/22 [place]  03/28/22 1448 03/29/22 0424 03/29/22 0451  Weight: 63.7 kg 68.8 kg 64.8 kg   Body mass index is 25.31 kg/m.  Exam:  General: Tired, oriented x2 HEENT: Normocephalic, atraumatic, mucous membranes appear slightly dry Cardiovascular: Regular rate and rhythm, S1-S2 Respiratory:  Clear to auscultation bilaterally Abdomen: Soft, nontender, nondistended, positive bowel sounds Musculoskeletal: No clubbing or cyanosis, trace pitting edema Skin: No skin breaks, tears or lesions Psychiatry: Currently calm, but has done easily agitated Neurology: No focal deficits  Data Reviewed: Morning labs reviewed.  Labs consistent with end-stage renal disease.  Head CT reordered and is pending  Disposition:  Status is: Observation     Anticipated discharge date: 7/18  Remaining issues to be resolved so that patient can be discharged: Control of malignant blood pressure  Family Communication: Left message for family DVT Prophylaxis: SCDs Start: 03/28/22 4650    Author: Annita Brod ,MD 03/29/2022 4:06 PM  To reach On-call, see care teams to locate the attending and reach out via [email or web address]. Between 7PM-7AM, please contact night-coverage If you still have difficulty reaching the attending provider, please page the [facility] (Director on Call) for [facility] on amion for assistance.

## 2022-03-29 NOTE — Assessment & Plan Note (Signed)
Continue statin. 

## 2022-03-29 NOTE — Progress Notes (Signed)
Pt is hallucinating and seeing spirits in her room. She also said move from back of me or I will stab you, to the spirits. She seems very agitated, nervous, and non trusting. Repeatedly ask question a her routine medication after explain the medication to her several time. She is wanting to know if her medication will make her sick.

## 2022-03-29 NOTE — Assessment & Plan Note (Addendum)
Patient continues to have a slow decline in her mentation.  Likely vascular secondary to uncontrolled hypertensive episodes.  Nephrology has had discussions with the patient's son about a living will status.  Psychiatry to evaluate.

## 2022-03-29 NOTE — Assessment & Plan Note (Addendum)
Intermittent, secondary to uncontrolled hypertension.  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

## 2022-03-29 NOTE — Progress Notes (Signed)
Patient was admitted with a hypertensive emergency. Her bp this morning was 205/72. Gave prn labetolol and bp went down to 197/68. Patient complained of blurry vision and had an episode of vomiting. Gave zofran. She ia alert and oriented. Completed a stroke scale on her and it was a 2. Saw that patient has been adamantly refusing a head CT. Asked patient again would she be willing to do a CT but she stated that she is scared. Educated her and family on it's importance and informed her that there are meds that can be given to calm her. Stroke scale is a 2 mostly related to visual issues due to bp. No weakness in extremeties and is alert/oriented x3. Contacted MD Mansy @ 0630.

## 2022-03-29 NOTE — Care Management Obs Status (Signed)
[facility] NOTIFICATION   Patient Details  Name: Tracy Jennings MRN: [id number] Date of Birth: 1956/05/12   Medicare Observation Status Notification Given:  Yes    Candie Chroman, LCSW 03/29/2022, 12:19 PM

## 2022-03-29 NOTE — Progress Notes (Signed)
Established hemodialysis patient known at [facility] MWF 11:20am. Patient rides ACT[employer] and stated no dialysis concerns.

## 2022-03-30 ENCOUNTER — Ambulatory Visit: Payer: Medicare Other

## 2022-03-30 DIAGNOSIS — R9431 Abnormal electrocardiogram [ECG] [EKG]: Secondary | ICD-10-CM | POA: Diagnosis not present

## 2022-03-30 DIAGNOSIS — D638 Anemia in other chronic diseases classified elsewhere: Secondary | ICD-10-CM | POA: Diagnosis not present

## 2022-03-30 DIAGNOSIS — I161 Hypertensive emergency: Secondary | ICD-10-CM | POA: Diagnosis not present

## 2022-03-30 DIAGNOSIS — N186 End stage renal disease: Secondary | ICD-10-CM | POA: Diagnosis not present

## 2022-03-30 LAB — GLUCOSE, CAPILLARY: Glucose-Capillary: 103 mg/dL — ABNORMAL HIGH (ref 70–99)

## 2022-03-30 LAB — HEPATITIS B SURFACE ANTIBODY, QUANTITATIVE: Hep B S AB Quant (Post): >1000 m[IU]/mL (ref >9.9)

## 2022-03-30 NOTE — Progress Notes (Signed)
[facility] Progress Note  Patient: Tracy Jennings    WGN:[id number]  DOA: 03/27/2022    Date of Service: the patient was seen and examined on 03/30/2022  Brief hospital course: 66 year old female with past medical history of end-stage renal disease on hemodialysis, hypertension, diabetes mellitus, peripheral vascular disease and pancreatitis who presented to the emergency room on 7/16 with complaints of headache and abdominal and leg pain/cramping for the past 1 to 2 days.  In the emergency room, patient found to have markedly elevated blood pressure of 784 systolic and an EKG noting T wave inversions in inferior leads.  Chest x-ray consistent with pulmonary edema from volume overload.  Nephrology consulted for hemodialysis which was started on 7/17, however this session was terminated early due to patient agitation as well as tablet malfunction.  Over the next 24 hours, patient continued to have malignant hypertension with blood pressures ranging from the 696E to 952W systolic, along with complaining of intermittent headaches along with some blurry vision.  On 7/19, patient having more having some audio and visual hallucinations.  She also made indirect threats of harm saying that she would stab someone although she did not make any physical attempts.  Psychiatry consulted.  Following dialysis 7/19, blood pressures have been somewhat better ranging from 90s to 140s, with blood pressures 180s to 200s before dialysis.   Assessment and Plan: Assessment and Plan: * Hypertensive emergency Patient states that she has been compliant with her dialysis.  Post dialysis, blood pressures have somewhat improved although still quite elevated.  Have restarted her home medications and may need additional medication support.  Patient declined being on hydralazine due to rash.  Showing signs of endorgan damage given headaches and episodes of worsening confusion.  Added Coreg and Imdur and this plus dialysis  seems to have improved her blood pressure.  ESRD (end stage renal disease) [facility]) Appreciate nephrology help.  Status post hemodialysis 7/17 (although had to be terminated early due to elevated blood pressure) and on 7/19.  EKG, abnormal Little change from 1 on admission although T wave inversion in the inferior leads appears to be mostly resolved.  Generalized abdominal pain Appears to be improved as blood pressure improves.  No signs of obstruction.  Does not appear to be related to her chronic pancreatitis.  Anemia of chronic disease Stable.  Hyperlipidemia Continue statin  Headache Intermittent, secondary to uncontrolled hypertension.  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  Dementia with behavioral disturbance [facility]) Patient continues to have a slow decline in her mentation.  Likely vascular secondary to uncontrolled hypertensive episodes.  Nephrology has had discussions with the patient's son about a living will status.  Psychiatry to evaluate.       Body mass index is 23.98 kg/m.    Pressure Injury 06/26/20 Sacrum Mid Stage 2 -  Partial thickness loss of dermis presenting as a shallow open injury with a red, pink wound bed without slough. open pink area (Active)  06/26/20 1900  Location: Sacrum  Location Orientation: Mid  Staging: Stage 2 -  Partial thickness loss of dermis presenting as a shallow open injury with a red, pink wound bed without slough.  Wound Description (Comments): open pink area  Present on Admission:      Consultants: Nephrology Psychiatry  Procedures: Hemodialysis 7/17, 7/19  Antimicrobials: None  Code Status: Full code   Subjective: Patient states headache is a little better  Objective: Vital signs were reviewed  and unremarkable. Vitals:   03/30/22 1530 03/30/22 1540  BP: (!) 116/58 (!) 148/58  Pulse: 72 73  Resp:  18  Temp:  98.4 F (36.9 C)  SpO2: 96% 97%    Intake/Output  Summary (Last 24 hours) at 03/30/2022 1732 Last data filed at 03/30/2022 1507 Gross per 24 hour  Intake 120 ml  Output 1500 ml  Net -1380 ml    Filed Weights   03/30/22 0424 03/30/22 0900 03/30/22 1259  Weight: 66.8 kg 62.9 kg 61.4 kg   Body mass index is 23.98 kg/m.  Exam:  General: Tired, oriented x2 HEENT: Normocephalic, atraumatic, mucous membranes appear slightly dry Cardiovascular: Regular rate and rhythm, S1-S2 Respiratory: Clear to auscultation bilaterally Abdomen: Soft, nontender, nondistended, positive bowel sounds Musculoskeletal: No clubbing or cyanosis, trace pitting edema Skin: No skin breaks, tears or lesions Psychiatry: Currently calm, but has done easily agitated Neurology: No focal deficits  Data Reviewed: Refused labs today  Disposition:  Status is: Inpatient    Anticipated discharge date: 7/20  Remaining issues to be resolved so that patient can be discharged: Control of malignant blood pressure  Family Communication: Left message for son DVT Prophylaxis: SCDs Start: 03/28/22 1103    Author: Annita Brod ,MD 03/30/2022 5:32 PM  To reach On-call, see care teams to locate the attending and reach out via [email or web address]. Between 7PM-7AM, please contact night-coverage If you still have difficulty reaching the attending provider, please page the [facility] (Director on Call) for [facility] on amion for assistance.

## 2022-03-30 NOTE — Progress Notes (Signed)
[facility]  ROUNDING NOTE   Subjective:   Tracy Jennings is a 66 year old female with past medical history including hypertension, diabetes, pancreatitis, cervical cancer, and end-stage renal disease on hemodialysis.  Patient presents to the emergency department from home with complaints of headache and generalized pain.  Patient is admitted under observation for Hypertensive urgency [I16.0] Nonintractable headache, unspecified chronicity pattern, unspecified headache type [R51.9] Hypervolemia, unspecified hypervolemia type [E87.70] Hypertension, unspecified type [I10]  Patient is known to our practice and receives outpatient dialysis treatments at [facility] on a MWF schedule, supervised by Dr. Candiss Norse.    Update Patient seen and evaluated during dialysis   HEMODIALYSIS FLOWSHEET:  Blood Flow Rate (mL/min): 400 mL/min Arterial Pressure (mmHg): -170 mmHg Venous Pressure (mmHg): 330 mmHg TMP (mmHg): 6 mmHg Ultrafiltration Rate (mL/min): 772 mL/min Dialysate Flow Rate (mL/min): 300 ml/min Dialysis Fluid Bolus: Normal Saline Bolus Amount (mL): 300 mL  No complaints at this time Blood pressure remains elevated during treatment   Objective:  Vital signs in last 24 hours:  Temp:  [97.8 F (36.6 C)-98.5 F (36.9 C)] 98.1 F (36.7 C) (07/19 0916) Pulse Rate:  [67-82] 73 (07/19 1130) Resp:  [12-22] 19 (07/19 1130) BP: (162-214)/(63-99) 214/78 (07/19 1130) SpO2:  [96 %-100 %] 100 % (07/19 1130) Weight:  [62.9 kg-66.8 kg] 62.9 kg (07/19 0900)  Weight change: -1.2 kg Filed Weights   03/29/22 0451 03/30/22 0424 03/30/22 0900  Weight: 64.8 kg 66.8 kg 62.9 kg    Intake/Output: No intake/output data recorded.   Intake/Output this shift:  No intake/output data recorded.  Physical Exam: General: NAD  Head: Normocephalic, atraumatic. Moist oral mucosal membranes  Eyes: Anicteric  Lungs:  Clear to auscultation normal effort  Heart: Regular rate and  rhythm  Abdomen:  Soft, nontender  Extremities:  No peripheral edema.  Neurologic: Nonfocal, moving all four extremities  Skin: No lesions  Access: Lt Permcath    Basic Metabolic Panel: Recent Labs  Lab 03/27/22 2030 03/28/22 0516 03/29/22 0448  NA 138 139 143  K 4.8 4.6 4.3  CL 99 101 104  CO2 '22 23 30  '$ GLUCOSE 125* 93 102*  BUN 62* 66* 54*  CREATININE 8.36* 9.06* 7.73*  CALCIUM 8.8* 8.5* 7.8*  PHOS  --   --  7.2*     Liver Function Tests: Recent Labs  Lab 03/27/22 2030 03/28/22 0516 03/29/22 0448  AST 18 37  --   ALT 13 26  --   ALKPHOS 60 63  --   BILITOT 0.8 0.6  --   PROT 7.6 7.4  --   ALBUMIN 4.0 3.9 3.5    Recent Labs  Lab 03/27/22 2030  LIPASE 31    No results for input(s): "AMMONIA" in the last 168 hours.  CBC: Recent Labs  Lab 03/27/22 2030 03/28/22 0516  WBC 8.6 7.6  HGB 11.4* 10.7*  HCT 37.2 35.1*  MCV 100.8* 101.2*  PLT 240 234     Cardiac Enzymes: No results for input(s): "CKTOTAL", "CKMB", "CKMBINDEX", "TROPONINI" in the last 168 hours.  BNP: Invalid input(s): "POCBNP"  CBG: Recent Labs  Lab 03/30/22 0757  GLUCAP 103*    Microbiology: Results for orders placed or performed during the hospital encounter of 07/14/20  SARS Coronavirus 2 by RT PCR (hospital order, performed in [facility] hospital lab) Nasopharyngeal Nasopharyngeal Swab     Status: None   Collection Time: 07/14/20 11:15 AM   Specimen: Nasopharyngeal Swab  Result Value Ref Range Status  SARS Coronavirus 2 NEGATIVE NEGATIVE Final    Comment: (NOTE) SARS-CoV-2 target nucleic acids are NOT DETECTED.  The SARS-CoV-2 RNA is generally detectable in upper and lower respiratory specimens during the acute phase of infection. The lowest concentration of SARS-CoV-2 viral copies this assay can detect is 250 copies / mL. A negative result does not preclude SARS-CoV-2 infection and should not be used as the sole basis for treatment or other patient management  decisions.  A negative result may occur with improper specimen collection / handling, submission of specimen other than nasopharyngeal swab, presence of viral mutation(s) within the areas targeted by this assay, and inadequate number of viral copies (<250 copies / mL). A negative result must be combined with clinical observations, patient history, and epidemiological information.  Fact Sheet for Patients:   StrictlyIdeas.no  Fact Sheet for Healthcare Providers: BankingDealers.co.za  This test is not yet approved or  cleared by the [place] FDA and has been authorized for detection and/or diagnosis of SARS-CoV-2 by FDA under an Emergency Use Authorization (EUA).  This EUA will remain in effect (meaning this test can be used) for the duration of the COVID-19 declaration under Section 564(b)(1) of the Act, 21 U.S.C. section 360bbb-3(b)(1), unless the authorization is terminated or revoked sooner.  Performed at [facility], [address] [place], [place] [address]     Coagulation Studies: No results for input(s): "LABPROT", "INR" in the last 72 hours.  Urinalysis: No results for input(s): "COLORURINE", "LABSPEC", "PHURINE", "GLUCOSEU", "HGBUR", "BILIRUBINUR", "KETONESUR", "PROTEINUR", "UROBILINOGEN", "NITRITE", "LEUKOCYTESUR" in the last 72 hours.  Invalid input(s): "APPERANCEUR"    Imaging: CT HEAD WO CONTRAST (5MM)  Result Date: 03/29/2022 CLINICAL DATA:  Hypertensive emergency EXAM: CT HEAD WITHOUT CONTRAST TECHNIQUE: Contiguous axial images were obtained from the base of the skull through the vertex without intravenous contrast. RADIATION DOSE REDUCTION: This exam was performed according to the departmental dose-optimization program which includes automated exposure control, adjustment of the mA and/or kV according to patient size and/or use of iterative reconstruction technique. COMPARISON:  CT head October 28, 2019. 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 No evidence of acute large vascular territory infarct, mass lesion, midline shift, acute hemorrhage or hydrocephalus. Patchy white matter hypodensities, nonspecific but compatible with chronic microvascular disease. Partially empty sella. Vascular: Calcific intracranial atherosclerosis. No hyperdense vessel identified. Skull: No acute fracture. Sinuses/Orbits: Clear visualized sinuses. No acute orbital findings. Other: No mastoid effusions. IMPRESSION: 1. No evidence of acute intracranial abnormality. 2. 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 MRI could provide more sensitive evaluation for acute infarct if clinically warranted. Electronically Signed   By: Margaretha Sheffield M.D.   On: 03/29/2022 16:55     Medications:    anticoagulant sodium citrate      amLODipine  10 mg Oral Daily   calcium acetate  667 mg Oral TID WC   carvedilol  25 mg Oral BID WC   Chlorhexidine Gluconate Cloth  6 each Topical Q0600   doxazosin  4 mg Oral Daily   isosorbide mononitrate  30 mg Oral Once   isosorbide mononitrate  60 mg Oral Daily   losartan  100 mg Oral QPC supper   acetaminophen **OR** acetaminophen, albuterol, alteplase, anticoagulant sodium citrate, heparin, heparin, labetalol, lidocaine (PF), lidocaine-prilocaine, LORazepam, morphine injection, ondansetron **OR** ondansetron (ZOFRAN) IV, pentafluoroprop-tetrafluoroeth  Assessment/ Plan:  [name] is a 66 y.o.  female with past medical history including hypertension, diabetes, pancreatitis, cervical cancer, and end-stage renal disease on hemodialysis.  Patient presents to the emergency department from home with complaints of headache and generalized pain.  Patient is admitted under observation for Hypertensive urgency  [I16.0] Nonintractable headache, unspecified chronicity pattern, unspecified headache type [R51.9] Hypervolemia, unspecified hypervolemia type [E87.70] Hypertension, unspecified type [I10]   Hypertension with chronic kidney disease.  Home regimen includes amlodipine, doxazosin, and losartan.  Restarted on all home medications.   Blood pressure currently 218/74 during dialysis.  Patient given as needed labetalol 10 mg for elevated blood pressure. Will continue to monitor.   Anemia of chronic kidney disease Lab Results  Component Value Date   HGB 10.7 (L) 03/28/2022   Patient receives [place] outpatient. Hgb at goal  3.  End-stage renal disease on hemodialysis.  Will maintain outpatient schedule as possible.    Receiving scheduled dialysis treatment today, UF goal 1.5 to 2 L as tolerated.  Next treatment scheduled for Friday.  4. Secondary Hyperparathyroidism:  Lab Results  Component Value Date   CALCIUM 7.8 (L) 03/29/2022   PHOS 7.2 (H) 03/29/2022    We will continue to monitor bone minerals during this admission.  Patient started on calcium acetate yesterday with meals.     LOS: 1   7/19/202311:58 AM

## 2022-03-30 NOTE — Consult Note (Signed)
1315: Attempted consult. Patient at dialysis. Patient's son was in her room and states that when patient misses dialysis that she "doesn't think right and hallucinates."  Will attempt consult at later time  Sherlon Handing, PMHNP

## 2022-03-30 NOTE — Progress Notes (Signed)
Urinalysis not collected, as patient is unable to void. Did not void for the duration of this shift

## 2022-03-30 NOTE — Progress Notes (Signed)
Patient requested to use the restroom but then started complaining about "seeing and hearing" things. She became impulsive and slightly combative. She tried a second time but did not void. Patient reoriented and taken to her bed.

## 2022-03-30 NOTE — Progress Notes (Signed)
Hd of 3hrs50mns completed per pt request. SSherlyn Hay NP aware. 72.7L total vol processed. No complications noted. Report given to ngumabih ngu, rn. Total uf removed: 15069mPost hd weight: 61.4kg Post hd v/s: 98.1 218/69(111) 75 18 100%

## 2022-03-30 NOTE — Progress Notes (Signed)
B/P 195/63. Will administer PRN Labetalol for SBP greater than 185

## 2022-03-31 DIAGNOSIS — N186 End stage renal disease: Secondary | ICD-10-CM | POA: Diagnosis not present

## 2022-03-31 DIAGNOSIS — R9431 Abnormal electrocardiogram [ECG] [EKG]: Secondary | ICD-10-CM | POA: Diagnosis not present

## 2022-03-31 DIAGNOSIS — D638 Anemia in other chronic diseases classified elsewhere: Secondary | ICD-10-CM | POA: Diagnosis not present

## 2022-03-31 DIAGNOSIS — I161 Hypertensive emergency: Secondary | ICD-10-CM | POA: Diagnosis not present

## 2022-03-31 LAB — GLUCOSE, CAPILLARY: Glucose-Capillary: 91 mg/dL (ref 70–99)

## 2022-03-31 MED ORDER — IRBESARTAN 150 MG PO TABS
300.0000 mg | ORAL_TABLET | Freq: Every day | ORAL | Status: DC
  Administered 2022-03-31: 300 mg via ORAL
  Filled 2022-03-31 (×2): qty 2

## 2022-03-31 NOTE — Care Management Important Message (Signed)
Important Message  Patient Details  Name: Tracy Jennings MRN: [id number] Date of Birth: Jun 20, 1956   Medicare Important Message Given:  N/A - LOS <3 / Initial given by admissions     Dannette Barbara 03/31/2022, 2:27 PM

## 2022-03-31 NOTE — Progress Notes (Signed)
[facility]  ROUNDING NOTE   Subjective:   Tracy Jennings is a 66 year old female with past medical history including hypertension, diabetes, pancreatitis, cervical cancer, and end-stage renal disease on hemodialysis.  Patient presents to the emergency department from home with complaints of headache and generalized pain.  Patient is admitted under observation for Hypertensive urgency [I16.0] Nonintractable headache, unspecified chronicity pattern, unspecified headache type [R51.9] Hypervolemia, unspecified hypervolemia type [E87.70] Hypertension, unspecified type [I10]  Patient is known to our practice and receives outpatient dialysis treatments at [facility] on a MWF schedule, supervised by Dr. Candiss Norse.    Update  Patient seen resting in bed, alert, oriented, calm Son at bedside Patient states she feels well today Tolerated dialysis well yesterday No lower extremity edema   Objective:  Vital signs in last 24 hours:  Temp:  [98.1 F (36.7 C)-99.2 F (37.3 C)] 98.5 F (36.9 C) (07/20 1121) Pulse Rate:  [64-77] 66 (07/20 1121) Resp:  [16-23] 16 (07/20 1121) BP: (95-218)/(58-75) 104/60 (07/20 1121) SpO2:  [96 %-100 %] 99 % (07/20 1121) Weight:  [61.4 kg-63.7 kg] 63.7 kg (07/20 0435)  Weight change: -3.9 kg Filed Weights   03/30/22 0900 03/30/22 1259 03/31/22 0435  Weight: 62.9 kg 61.4 kg 63.7 kg    Intake/Output: I/O last 3 completed shifts: In: 360 [P.O.:360] Out: 1500 [Other:1500]   Intake/Output this shift:  Total I/O In: 240 [P.O.:240] Out: -   Physical Exam: General: NAD  Head: Normocephalic, atraumatic. Moist oral mucosal membranes  Eyes: Anicteric  Lungs:  Clear to auscultation normal effort  Heart: Regular rate and rhythm  Abdomen:  Soft, nontender  Extremities:  No peripheral edema.  Neurologic: Nonfocal, moving all four extremities  Skin: No lesions  Access: Lt Permcath    Basic Metabolic Panel: Recent Labs  Lab  03/27/22 2030 03/28/22 0516 03/29/22 0448  NA 138 139 143  K 4.8 4.6 4.3  CL 99 101 104  CO2 '22 23 30  '$ GLUCOSE 125* 93 102*  BUN 62* 66* 54*  CREATININE 8.36* 9.06* 7.73*  CALCIUM 8.8* 8.5* 7.8*  PHOS  --   --  7.2*     Liver Function Tests: Recent Labs  Lab 03/27/22 2030 03/28/22 0516 03/29/22 0448  AST 18 37  --   ALT 13 26  --   ALKPHOS 60 63  --   BILITOT 0.8 0.6  --   PROT 7.6 7.4  --   ALBUMIN 4.0 3.9 3.5    Recent Labs  Lab 03/27/22 2030  LIPASE 31    No results for input(s): "AMMONIA" in the last 168 hours.  CBC: Recent Labs  Lab 03/27/22 2030 03/28/22 0516  WBC 8.6 7.6  HGB 11.4* 10.7*  HCT 37.2 35.1*  MCV 100.8* 101.2*  PLT 240 234     Cardiac Enzymes: No results for input(s): "CKTOTAL", "CKMB", "CKMBINDEX", "TROPONINI" in the last 168 hours.  BNP: Invalid input(s): "POCBNP"  CBG: Recent Labs  Lab 03/30/22 0757 03/31/22 0734  GLUCAP 103* 91     Microbiology: Results for orders placed or performed during the hospital encounter of 07/14/20  SARS Coronavirus 2 by RT PCR (hospital order, performed in [facility] hospital lab) Nasopharyngeal Nasopharyngeal Swab     Status: None   Collection Time: 07/14/20 11:15 AM   Specimen: Nasopharyngeal Swab  Result Value Ref Range Status   SARS Coronavirus 2 NEGATIVE NEGATIVE Final    Comment: (NOTE) SARS-CoV-2 target nucleic acids are NOT DETECTED.  The SARS-CoV-2  RNA is generally detectable in upper and lower respiratory specimens during the acute phase of infection. The lowest concentration of SARS-CoV-2 viral copies this assay can detect is 250 copies / mL. A negative result does not preclude SARS-CoV-2 infection and should not be used as the sole basis for treatment or other patient management decisions.  A negative result may occur with improper specimen collection / handling, submission of specimen other than nasopharyngeal swab, presence of viral mutation(s) within the areas  targeted by this assay, and inadequate number of viral copies (<250 copies / mL). A negative result must be combined with clinical observations, patient history, and epidemiological information.  Fact Sheet for Patients:   StrictlyIdeas.no  Fact Sheet for Healthcare Providers: BankingDealers.co.za  This test is not yet approved or  cleared by the [place] FDA and has been authorized for detection and/or diagnosis of SARS-CoV-2 by FDA under an Emergency Use Authorization (EUA).  This EUA will remain in effect (meaning this test can be used) for the duration of the COVID-19 declaration under Section 564(b)(1) of the Act, 21 U.S.C. section 360bbb-3(b)(1), unless the authorization is terminated or revoked sooner.  Performed at [facility], [address] [place], [place] [address]     Coagulation Studies: No results for input(s): "LABPROT", "INR" in the last 72 hours.  Urinalysis: No results for input(s): "COLORURINE", "LABSPEC", "PHURINE", "GLUCOSEU", "HGBUR", "BILIRUBINUR", "KETONESUR", "PROTEINUR", "UROBILINOGEN", "NITRITE", "LEUKOCYTESUR" in the last 72 hours.  Invalid input(s): "APPERANCEUR"    Imaging: CT HEAD WO CONTRAST (5MM)  Result Date: 03/29/2022 CLINICAL DATA:  Hypertensive emergency EXAM: CT HEAD WITHOUT CONTRAST TECHNIQUE: Contiguous axial images were obtained from the base of the skull through the vertex without intravenous contrast. RADIATION DOSE REDUCTION: This exam was performed according to the departmental dose-optimization program which includes automated exposure control, adjustment of the mA and/or kV according to patient size and/or use of iterative reconstruction technique. COMPARISON:  CT head October 28, 2019. 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 No evidence of acute large vascular territory infarct, mass  lesion, midline shift, acute hemorrhage or hydrocephalus. Patchy white matter hypodensities, nonspecific but compatible with chronic microvascular disease. Partially empty sella. Vascular: Calcific intracranial atherosclerosis. No hyperdense vessel identified. Skull: No acute fracture. Sinuses/Orbits: Clear visualized sinuses. No acute orbital findings. Other: No mastoid effusions. IMPRESSION: 1. No evidence of acute intracranial abnormality. 2. 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 MRI could provide more sensitive evaluation for acute infarct if clinically warranted. Electronically Signed   By: Margaretha Sheffield M.D.   On: 03/29/2022 16:55     Medications:    anticoagulant sodium citrate      amLODipine  10 mg Oral Daily   calcium acetate  667 mg Oral TID WC   carvedilol  25 mg Oral BID WC   Chlorhexidine Gluconate Cloth  6 each Topical Q0600   doxazosin  4 mg Oral Daily   irbesartan  300 mg Oral QPC supper   isosorbide mononitrate  30 mg Oral Once   isosorbide mononitrate  60 mg Oral Daily   acetaminophen **OR** acetaminophen, albuterol, alteplase, anticoagulant sodium citrate, heparin, heparin, labetalol, lidocaine (PF), lidocaine-prilocaine, LORazepam, morphine injection, ondansetron **OR** ondansetron (ZOFRAN) IV, pentafluoroprop-tetrafluoroeth  Assessment/ Plan:  [name] is a 66 y.o.  female with past medical history including hypertension, diabetes, pancreatitis, cervical cancer, and  end-stage renal disease on hemodialysis.  Patient presents to the emergency department from home with complaints of headache and generalized pain.  Patient is admitted under observation for Hypertensive urgency [I16.0] Nonintractable headache, unspecified chronicity pattern, unspecified headache type [R51.9] Hypervolemia, unspecified hypervolemia type [E87.70] Hypertension, unspecified type  [I10]   Hypertension with chronic kidney disease.  Home regimen includes amlodipine, doxazosin, and losartan.  Restarted on all home medications.   Blood pressure currently 104/60.  Primary team currently monitoring medications to achieve therapeutic, consistent readings.  Anemia of chronic kidney disease Lab Results  Component Value Date   HGB 10.7 (L) 03/28/2022   Patient receives [place] outpatient.  3.  End-stage renal disease on hemodialysis.  Will maintain outpatient schedule as possible.    Patient received dialysis yesterday, UF goal.  1.5 L achieved.  Patient did become agitated during last 20 minutes of treatment, therefore treatment was terminated early.  Next treatment scheduled for Friday.  4. Secondary Hyperparathyroidism:  Lab Results  Component Value Date   CALCIUM 7.8 (L) 03/29/2022   PHOS 7.2 (H) 03/29/2022    We will continue to monitor bone minerals during this admission.  We will obtain updated labs with dialysis tomorrow    LOS: 2 [place] 7/20/202312:50 PM

## 2022-03-31 NOTE — Consult Note (Addendum)
[facility] Consult   Reason for Consult:  hallucinations/aggression Referring Physician:  Lyman Speller Patient Identification: Tracy Jennings MRN:  [id number] Principal Diagnosis: Dementia with behavioral disturbance ([place]) Diagnosis:  Principal Problem:   Dementia with behavioral disturbance ([place]) Active Problems:   Chronic recurrent pancreatitis ([place])   ESRD (end stage renal disease) ([place])   Hyperlipidemia   Generalized abdominal pain   Anemia of chronic disease   Hypertensive emergency   EKG, abnormal   Headache   Hypertensive urgency   Total Time spent with patient: 30 minutes  Subjective:   Tracy Jennings is a 66 y.o. female patient admitted with hypertensive crisis.  HPI:  Patient on medical unit for hypertensive urgency. Apparently she had some episodes of visual/auditory hallucinations at some point earlier in her stay and a psych consult was ordered. Writer attempted to consult on patient yesterday, but patient was in HD at the time.  Chart reviewed, and patient seen today. She is very calm, alert, watching basketball with her son on approach. Patient makes good eye contact, does not appear to be responding to internal stimuli. Patient denies any hallucinations and reports that she was hearing and seeing things when she received morphine, and has not had any incidents since. Patient speaks in clear, linear sentences. She denies any thoughts of self-harm/suicide. Denies thoughts of harming others. Patient denies any psychiatric history or diagnoses. Per chart review, patient is reported to have a diagnosis of dementia with behavioral disturbance with decline in mentation which is likely vascular secondary to uncontrolled hypertensive episodes.  At this time, medication for hallucinations is not warranted. Recommend that patient/family refer to outpatient providers to manage any medication for hallucinations if symptoms return.  Patient does not meet criteria  for inpatient psychiatric hospitalization.   Past Psychiatric History: denies  Risk to Self:   Risk to Others:   Prior Inpatient Therapy:   Prior Outpatient Therapy:    Past Medical History:  Past Medical History:  Diagnosis Date   Anemia 04/2018   low iron. to be started on supplements   Cervical cancer ([place])    CKD (chronic kidney disease)    Stage IV   Complication of anesthesia    receceived too much anesthesia, that she was in coma for a couple days    COVID-19 virus detected 10/28/2019   Diabetes mellitus without complication (HCC)    type II   ESRD (end stage renal disease) ([place])    Heart murmur    followed as a child only   HSIL (high grade squamous intraepithelial lesion) on Pap smear of cervix    Hyperlipidemia associated with type 2 diabetes mellitus ([place])    Hypertension    Pancreatitis    Peripheral vascular disease ([place])     Past Surgical History:  Procedure Laterality Date   AMPUTATION TOE Left 2013   2nd toe. tip of toe (toe nail was infected)   AV FISTULA PLACEMENT Left 05/11/2018   Procedure: ARTERIOVENOUS (AV) FISTULA CREATION;  Surgeon: Katha Cabal, MD;  Location: [facility] OR[facility];  Service: Vascular;  Laterality: Left;   CATARACT EXTRACTION     CERVICAL CONIZATION W/BX N/A 04/08/2020   Procedure: CONIZATION CERVIX WITH BIOPSY;  Surgeon: Mellody Drown, MD;  Location: [facility];  Service: Gynecology;  Laterality: N/A;   CHOLECYSTECTOMY  2014   COLONOSCOPY     COLONOSCOPY WITH PROPOFOL N/A 01/10/2018   Procedure: COLONOSCOPY WITH PROPOFOL;  Surgeon: Toledo, Benay Pike, MD;  Location: [facility];  Service: Gastroenterology;  Laterality: N/A;   DIALYSIS/PERMA CATHETER INSERTION N/A 05/21/2018   Procedure: DIALYSIS/PERMA CATHETER INSERTION;  Surgeon: Algernon Huxley, MD;  Location: [facility];  Service: Cardiovascular;  Laterality: N/A;   DIALYSIS/PERMA CATHETER INSERTION N/A 07/14/2020   Procedure: DIALYSIS/PERMA CATHETER INSERTION;  Surgeon: Algernon Huxley, MD;  Location: [facility];  Service: Cardiovascular;  Laterality: N/A;   DIALYSIS/PERMA CATHETER REMOVAL N/A 04/11/2019   Procedure: DIALYSIS/PERMA CATHETER REMOVAL;  Surgeon: Algernon Huxley, MD;  Location: [facility];  Service: Cardiovascular;  Laterality: N/A;   EYE SURGERY Bilateral 2018   cataract extractions   IR IMAGING GUIDED PORT INSERTION  06/05/2020   THROMBECTOMY W/ EMBOLECTOMY  05/11/2018   Procedure: THROMBECTOMY ARTERIOVENOUS FISTULA;  Surgeon: Katha Cabal, MD;  Location: [facility];  Service: Vascular;;   TUBAL LIGATION  1984   Family History:  Family History  Problem Relation Age of Onset   Stroke Mother    Hypertension Mother    Gout Mother    Cancer Father    Diabetes Sister    Diabetes Maternal Grandmother    Diabetes Maternal Grandfather    Diabetes Paternal Grandmother    Diabetes Paternal Grandfather    Family Psychiatric  History: none reported Social History:  Social History   Substance and Sexual Activity  Alcohol Use No     Social History   Substance and Sexual Activity  Drug Use Never    Social History   Socioeconomic History   Marital status: Married    Spouse name: clarence   Number of children: 2   Years of education: Not on file   Highest education level: Not on file  Occupational History    Comment: works nights  Tobacco Use   Smoking status: Never   Smokeless tobacco: Never  Vaping Use   Vaping Use: Never used  Substance and Sexual Activity   Alcohol use: No   Drug use: Never   Sexual activity: Not Currently  Other Topics Concern   Not on file  Social History Narrative   Not on file   Social Determinants of Health   Financial Resource Strain: Not on file  Food Insecurity: Not on file  Transportation Needs: Not on file  Physical Activity: Not on file  Stress: Not on file  Social Connections: Not on file   Additional Social History:    Allergies:   Allergies  Allergen Reactions    Amlodipine Swelling    Knees down to ankles Knees down to ankles   Hctz [Hydrochlorothiazide]     pancreatitis   Heparin Other (See Comments)    Pt reports cardiac arrest when given heparin   Lmw Heparin     Reports cardiac arrest when given heparin   Other Other (See Comments)    Reports cardiac arrest when given during surgery Reports cardiac arrest when given during surgery Reports cardiac arrest when given during surgery    Sulfa Antibiotics Rash   Dairy Aid [Tilactase]     Runny nose   Hydralazine     Nausea and rash    Labs:  Results for orders placed or performed during the hospital encounter of 03/27/22 (from the past 48 hour(s))  Glucose, capillary     Status: Abnormal   Collection Time: 03/30/22  7:57 AM  Result Value Ref Range   Glucose-Capillary 103 (H) 70 - 99 mg/dL    Comment: Glucose reference range applies only to samples taken after fasting  for at least 8 hours.  Glucose, capillary     Status: None   Collection Time: 03/31/22  7:34 AM  Result Value Ref Range   Glucose-Capillary 91 70 - 99 mg/dL    Comment: Glucose reference range applies only to samples taken after fasting for at least 8 hours.    Current Facility-Administered Medications  Medication Dose Route Frequency Provider Last Rate Last Admin   acetaminophen (TYLENOL) tablet 650 mg  650 mg Oral Q6H PRN Clance Boll, MD       Or   acetaminophen (TYLENOL) suppository 650 mg  650 mg Rectal Q6H PRN Clance Boll, MD       albuterol (PROVENTIL) (2.5 MG/3ML) 0.083% nebulizer solution 2.5 mg  2.5 mg Nebulization Q2H PRN Myles Rosenthal A, MD       alteplase (CATHFLO ACTIVASE) injection 2 mg  2 mg Intracatheter Once PRN Murlean Iba, MD       amLODipine (NORVASC) tablet 10 mg  10 mg Oral Daily Annita Brod, MD   10 mg at 03/31/22 0831   anticoagulant sodium citrate solution 5 mL  5 mL Intracatheter PRN Murlean Iba, MD       calcium acetate (PHOSLO) capsule 667 mg  667 mg Oral TID  WC Breeze, Benancio Deeds, NP   667 mg at 03/31/22 1212   carvedilol (COREG) tablet 25 mg  25 mg Oral BID WC Annita Brod, MD   25 mg at 03/31/22 0831   Chlorhexidine Gluconate Cloth 2 % PADS 6 each  6 each Topical Q0600 Murlean Iba, MD   6 each at 03/31/22 0440   doxazosin (CARDURA) tablet 4 mg  4 mg Oral Daily Annita Brod, MD   4 mg at 03/31/22 0831   heparin injection 1,000 Units  1,000 Units Intracatheter PRN Murlean Iba, MD       heparin injection 1,200 Units  20 Units/kg Dialysis PRN Murlean Iba, MD       irbesartan (AVAPRO) tablet 300 mg  300 mg Oral QPC supper Annita Brod, MD       isosorbide mononitrate (IMDUR) 24 hr tablet 30 mg  30 mg Oral Once Annita Brod, MD       isosorbide mononitrate (IMDUR) 24 hr tablet 60 mg  60 mg Oral Daily Annita Brod, MD   60 mg at 03/31/22 0831   labetalol (NORMODYNE) injection 10 mg  10 mg Intravenous Q2H PRN Myles Rosenthal A, MD   10 mg at 03/30/22 1350   lidocaine (PF) (XYLOCAINE) 1 % injection 5 mL  5 mL Intradermal PRN Murlean Iba, MD       lidocaine-prilocaine (EMLA) cream 1 Application  1 Application Topical PRN Murlean Iba, MD       LORazepam (ATIVAN) injection 1 mg  1 mg Intravenous Q4H PRN Annita Brod, MD       morphine (PF) 2 MG/ML injection 2 mg  2 mg Intravenous Q4H PRN Mansy, Jan A, MD   2 mg at 03/28/22 0253   ondansetron (ZOFRAN) tablet 4 mg  4 mg Oral Q6H PRN Clance Boll, MD       Or   ondansetron [facility]) injection 4 mg  4 mg Intravenous Q6H PRN Clance Boll, MD   4 mg at 03/29/22 0515   pentafluoroprop-tetrafluoroeth (GEBAUERS) aerosol 1 Application  1 Application Topical PRN Murlean Iba, MD        Musculoskeletal: Strength & Muscle Tone:  did not assess Gait &  Station:  did not observe Patient leans: N/A  Psychiatric Specialty Exam:  Presentation  General Appearance: Appropriate for Environment Eye Contact:Good Speech:Clear and Coherent Speech  Volume:Normal Handedness:No data recorded  Mood and Affect  Mood:Euthymic Affect:Congruent  Thought Process  Thought Processes:Coherent; Goal Directed Descriptions of Associations:Intact  Orientation:Full (Time, Place and Person)  Thought Content:Logical  History of Schizophrenia/Schizoaffective disorder:No data recorded Duration of Psychotic Symptoms:No data recorded Hallucinations:Hallucinations: None  Ideas of Reference:None  Suicidal Thoughts:Suicidal Thoughts: No  Homicidal Thoughts:Homicidal Thoughts: No   Sensorium  Memory:Immediate Good Judgment:Good Insight:Good  Executive Functions  Concentration:Good Attention Span:Good [place] of Knowledge:Good Language:Good  Psychomotor Activity  Psychomotor Activity:Psychomotor Activity: Normal  Assets  Assets:Communication Skills; Financial Resources/Insurance; Housing; Social Support; Resilience  Sleep  Sleep:Sleep: Good  Physical Exam: Physical Exam Vitals and nursing note reviewed.  HENT:     Head: Normocephalic.     Nose: No congestion or rhinorrhea.  Eyes:     General:        Right eye: No discharge.        Left eye: No discharge.  Cardiovascular:     Rate and Rhythm: Normal rate.  Pulmonary:     Effort: Pulmonary effort is normal.  Musculoskeletal:     Cervical back: Normal range of motion.  Skin:    General: Skin is dry.  Neurological:     Mental Status: She is alert and oriented to person, place, and time.  Psychiatric:        Attention and Perception: Attention normal.        Mood and Affect: Mood normal.        Speech: Speech normal.        Behavior: Behavior normal.        Thought Content: Thought content normal.        Cognition and Memory: Cognition normal.        Judgment: Judgment normal.    Review of Systems  Eyes: Negative.   Respiratory: Negative.    Neurological:  Negative for speech change and focal weakness.  Psychiatric/Behavioral: Negative.  Negative for  depression, hallucinations, memory loss, substance abuse and suicidal ideas. The patient is not nervous/anxious and does not have insomnia.        Patient denies any psychiatric history    Blood pressure 104/60, pulse 66, temperature 98.5 F (36.9 C), temperature source Oral, resp. rate 16, height '5\' 3"'$  (1.6 m), weight 63.7 kg, SpO2 99 %. Body mass index is 24.88 kg/m.  Treatment Plan Summary: Plan Patient had what appears to be transient episode of visual hallucination after being given morphine ,  according to patient. There is no indication that patient is experiencing any hallucinations on this writer's evaluation. Bedside RN has not seen any indication, either. Patient denies any psychiatric history, although there is some notation of some decreased mentation, dementia with behavioral disturbance.  Patient/family can follow with outpatient providers if hallucinations/aggression returns for their recommendations as far as medications. Reviewed with Dr. [place] Pink via secure chat.   Disposition: No evidence of imminent risk to self or others at present.   Patient does not meet criteria for psychiatric inpatient admission.  Sherlon Handing, NP 03/31/2022 2:59 PM

## 2022-03-31 NOTE — Progress Notes (Signed)
[facility] Progress Note  Patient: Tracy Jennings    BZM:[id number]  DOA: 03/27/2022    Date of Service: the patient was seen and examined on 03/31/2022  Brief hospital course: 66 year old female with past medical history of end-stage renal disease on hemodialysis, hypertension, diabetes mellitus, peripheral vascular disease and pancreatitis who presented to the emergency room on 7/16 with complaints of headache and abdominal and leg pain/cramping for the past 1 to 2 days.  In the emergency room, patient found to have markedly elevated blood pressure of 224 systolic and an EKG noting T wave inversions in inferior leads.  Chest x-ray consistent with pulmonary edema from volume overload.  Nephrology consulted for hemodialysis which was started on 7/17, however this session was terminated early due to patient agitation as well as tablet malfunction.  Over the next 24 hours, patient continued to have malignant hypertension with blood pressures ranging from the 497N to 300F systolic, along with complaining of intermittent headaches along with some blurry vision.  On 7/19, patient having more having some audio and visual hallucinations.  She also made indirect threats of harm saying that she would stab someone although she did not make any physical attempts.  Psychiatry consulted.  Following dialysis 7/19, blood pressures have been somewhat better ranging from 90s to 140s, with blood pressures 180s to 200s before dialysis.  With addition of medications, blood pressure still markedly elevated before medication administered and improved after.   Assessment and Plan: Assessment and Plan: * Hypertensive emergency Patient states that she has been compliant with her dialysis.  Post dialysis, blood pressures have somewhat improved although still quite elevated.  Have restarted her home medications and may need additional medication support.  Patient declined being on hydralazine due to rash.  Showing signs  of endorgan damage given headaches and episodes of worsening confusion.  Added Coreg and Imdur and this plus dialysis seems to have improved her blood pressure, but continues to spike in the early morning.  Woke up this morning with blood pressure in the 200s, but came down once she take oral medications to the point where it is quite controlled.  We will monitor through the rest of today and tomorrow and plan to discharge tomorrow after dialysis if blood pressure overall stable.  ESRD (end stage renal disease) [facility]) Appreciate nephrology help.  Status post hemodialysis 7/17 (although had to be terminated early due to elevated blood pressure) and on 7/19.  EKG, abnormal Little change from 1 on admission although T wave inversion in the inferior leads appears to be mostly resolved.  Generalized abdominal pain Appears to be improved as blood pressure improves.  No signs of obstruction.  Does not appear to be related to her chronic pancreatitis.  Anemia of chronic disease Stable.  Hyperlipidemia Continue statin  Headache Intermittent, secondary to uncontrolled hypertension.  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  Dementia with behavioral disturbance [facility]) Patient continues to have a slow decline in her mentation.  Likely vascular secondary to uncontrolled hypertensive episodes.  Nephrology has had discussions with the patient's son about a living will status.  Psychiatry to evaluate.       Body mass index is 24.88 kg/m.    Pressure Injury 06/26/20 Sacrum Mid Stage 2 -  Partial thickness loss of dermis presenting as a shallow open injury with a red, pink wound bed without slough. open pink area (Active)  06/26/20 1900  Location: Sacrum  Location Orientation:  Mid  Staging: Stage 2 -  Partial thickness loss of dermis presenting as a shallow open injury with a red, pink wound bed without slough.  Wound Description (Comments): open pink  area  Present on Admission:      Consultants: Nephrology Psychiatry  Procedures: Hemodialysis 7/17, 7/19  Antimicrobials: None  Code Status: Full code   Subjective: Patient states that she is tired  Objective: Vital signs were reviewed and unremarkable. Vitals:   03/31/22 0943 03/31/22 1121  BP: 135/62 104/60  Pulse: 64 66  Resp:  16  Temp:  98.5 F (36.9 C)  SpO2:  99%    Intake/Output Summary (Last 24 hours) at 03/31/2022 1353 Last data filed at 03/31/2022 0940 Gross per 24 hour  Intake 600 ml  Output --  Net 600 ml     Filed Weights   03/30/22 0900 03/30/22 1259 03/31/22 0435  Weight: 62.9 kg 61.4 kg 63.7 kg   Body mass index is 24.88 kg/m.  Exam:  General: Oriented x2 HEENT: Normocephalic, atraumatic, mucous membranes appear slightly dry Cardiovascular: Regular rate and rhythm, S1-S2 Respiratory: Clear to auscultation bilaterally Abdomen: Soft, nontender, nondistended, positive bowel sounds Musculoskeletal: No clubbing or cyanosis, trace pitting edema Skin: No skin breaks, tears or lesions Psychiatry: Currently calm, but has done easily agitated Neurology: No focal deficits  Data Reviewed: No labs today Disposition:  Status is: Inpatient    Anticipated discharge date: 7/21  Remaining issues to be resolved so that patient can be discharged: Control of malignant blood pressure  Family Communication: Left message for son DVT Prophylaxis: SCDs Start: 03/28/22 5974    Author: Annita Brod ,MD 03/31/2022 1:53 PM  To reach On-call, see care teams to locate the attending and reach out via [email or web address]. Between 7PM-7AM, please contact night-coverage If you still have difficulty reaching the attending provider, please page the [facility] (Director on Call) for [facility] on amion for assistance.

## 2022-04-01 DIAGNOSIS — K861 Other chronic pancreatitis: Secondary | ICD-10-CM

## 2022-04-01 DIAGNOSIS — N186 End stage renal disease: Secondary | ICD-10-CM | POA: Diagnosis not present

## 2022-04-01 DIAGNOSIS — F03918 Unspecified dementia, unspecified severity, with other behavioral disturbance: Secondary | ICD-10-CM

## 2022-04-01 DIAGNOSIS — I161 Hypertensive emergency: Secondary | ICD-10-CM | POA: Diagnosis not present

## 2022-04-01 LAB — RENAL FUNCTION PANEL
Albumin: 3.7 g/dL (ref 3.5–5.0)
Anion gap: 14 (ref 5–15)
BUN: 67 mg/dL — ABNORMAL HIGH (ref 8–23)
CO2: 25 mmol/L (ref 22–32)
Calcium: 8.1 mg/dL — ABNORMAL LOW (ref 8.9–10.3)
Chloride: 101 mmol/L (ref 98–111)
Creatinine, Ser: 8.96 mg/dL — ABNORMAL HIGH (ref 0.44–1.00)
GFR, Estimated: 5 mL/min — ABNORMAL LOW (ref >60)
Glucose, Bld: 99 mg/dL (ref 70–99)
Phosphorus: 7.1 mg/dL — ABNORMAL HIGH (ref 2.5–4.6)
Potassium: 4.4 mmol/L (ref 3.5–5.1)
Sodium: 140 mmol/L (ref 135–145)

## 2022-04-01 LAB — GLUCOSE, CAPILLARY: Glucose-Capillary: 94 mg/dL (ref 70–99)

## 2022-04-01 LAB — CBC
HCT: 34.8 % — ABNORMAL LOW (ref 36.0–46.0)
Hemoglobin: 11 g/dL — ABNORMAL LOW (ref 12.0–15.0)
MCH: 31.6 pg (ref 26.0–34.0)
MCHC: 31.6 g/dL (ref 30.0–36.0)
MCV: 100 fL (ref 80.0–100.0)
Platelets: 206 10*3/uL (ref 150–400)
RBC: 3.48 MIL/uL — ABNORMAL LOW (ref 3.87–5.11)
RDW: 15.9 % — ABNORMAL HIGH (ref 11.5–15.5)
WBC: 4.9 10*3/uL (ref 4.0–10.5)
nRBC: 0 % (ref 0.0–0.2)

## 2022-04-01 MED ORDER — CARVEDILOL 25 MG PO TABS: 25.0000 mg | ORAL_TABLET | Freq: Two times a day (BID) | ORAL | 3 refills | Status: DC

## 2022-04-01 MED ORDER — SODIUM CHLORIDE 0.9 % IV SOLN: INTRAVENOUS | Status: DC

## 2022-04-01 MED ORDER — ISOSORBIDE MONONITRATE ER 60 MG PO TB24: 60.0000 mg | ORAL_TABLET | Freq: Every day | ORAL | 2 refills | Status: DC

## 2022-04-01 MED ORDER — HEPARIN SOD (PORK) LOCK FLUSH 100 UNIT/ML IV SOLN
500.0000 [IU] | Freq: Once | INTRAVENOUS | Status: DC
  Filled 2022-04-01: qty 5

## 2022-04-01 MED ORDER — CALCIUM ACETATE (PHOS BINDER) 667 MG PO CAPS: 667.0000 mg | ORAL_CAPSULE | Freq: Three times a day (TID) | ORAL | 3 refills | Status: DC

## 2022-04-01 NOTE — Progress Notes (Signed)
[facility]  ROUNDING NOTE   Subjective:   Tracy Jennings is a 66 year old female with past medical history including hypertension, diabetes, pancreatitis, cervical cancer, and end-stage renal disease on hemodialysis.  Patient presents to the emergency department from home with complaints of headache and generalized pain.  Patient is admitted under observation for Hypertensive urgency [I16.0] Nonintractable headache, unspecified chronicity pattern, unspecified headache type [R51.9] Hypervolemia, unspecified hypervolemia type [E87.70] Hypertension, unspecified type [I10]  Patient is known to our practice and receives outpatient dialysis treatments at [facility] on a MWF schedule, supervised by Dr. Candiss Norse.    Update  Patient seen and evaluated during dialysis   HEMODIALYSIS FLOWSHEET:  Blood Flow Rate (mL/min): 400 mL/min Arterial Pressure (mmHg): -160 mmHg Venous Pressure (mmHg): 300 mmHg TMP (mmHg): 4 mmHg Ultrafiltration Rate (mL/min): 623 mL/min Dialysate Flow Rate (mL/min): 300 ml/min Dialysis Fluid Bolus: Normal Saline Bolus Amount (mL): 300 mL  No complaints at this time   Objective:  Vital signs in last 24 hours:  Temp:  [97.8 F (36.6 C)-99.2 F (37.3 C)] 98.2 F (36.8 C) (07/21 0815) Pulse Rate:  [66-72] 72 (07/21 1130) Resp:  [12-21] 17 (07/21 1130) BP: (124-190)/(57-101) 163/72 (07/21 1130) SpO2:  [99 %-100 %] 100 % (07/21 1217) Weight:  [61.1 kg-63 kg] 61.1 kg (07/21 1217)  Weight change: 0.1 kg Filed Weights   04/01/22 0429 04/01/22 0837 04/01/22 1217  Weight: 63 kg 62.5 kg 61.1 kg    Intake/Output: I/O last 3 completed shifts: In: 240 [P.O.:240] Out: -    Intake/Output this shift:  Total I/O In: -  Out: 1.4 [Other:1.4]  Physical Exam: General: NAD  Head: Normocephalic, atraumatic. Moist oral mucosal membranes  Eyes: Anicteric  Lungs:  Clear to auscultation normal effort  Heart: Regular rate and rhythm  Abdomen:   Soft, nontender  Extremities:  No peripheral edema.  Neurologic: Nonfocal, moving all four extremities  Skin: No lesions  Access: Lt Permcath    Basic Metabolic Panel: Recent Labs  Lab 03/27/22 2030 03/28/22 0516 03/29/22 0448 04/01/22 0440  NA 138 139 143 140  K 4.8 4.6 4.3 4.4  CL 99 101 104 101  CO2 '22 23 30 25  '$ GLUCOSE 125* 93 102* 99  BUN 62* 66* 54* 67*  CREATININE 8.36* 9.06* 7.73* 8.96*  CALCIUM 8.8* 8.5* 7.8* 8.1*  PHOS  --   --  7.2* 7.1*     Liver Function Tests: Recent Labs  Lab 03/27/22 2030 03/28/22 0516 03/29/22 0448 04/01/22 0440  AST 18 37  --   --   ALT 13 26  --   --   ALKPHOS 60 63  --   --   BILITOT 0.8 0.6  --   --   PROT 7.6 7.4  --   --   ALBUMIN 4.0 3.9 3.5 3.7    Recent Labs  Lab 03/27/22 2030  LIPASE 31    No results for input(s): "AMMONIA" in the last 168 hours.  CBC: Recent Labs  Lab 03/27/22 2030 03/28/22 0516 04/01/22 0440  WBC 8.6 7.6 4.9  HGB 11.4* 10.7* 11.0*  HCT 37.2 35.1* 34.8*  MCV 100.8* 101.2* 100.0  PLT 240 234 206     Cardiac Enzymes: No results for input(s): "CKTOTAL", "CKMB", "CKMBINDEX", "TROPONINI" in the last 168 hours.  BNP: Invalid input(s): "POCBNP"  CBG: Recent Labs  Lab 03/30/22 0757 03/31/22 0734 04/01/22 0803  GLUCAP 103* 91 94     Microbiology: Results for orders placed or  performed during the hospital encounter of 07/14/20  SARS Coronavirus 2 by RT PCR (hospital order, performed in [facility] hospital lab) Nasopharyngeal Nasopharyngeal Swab     Status: None   Collection Time: 07/14/20 11:15 AM   Specimen: Nasopharyngeal Swab  Result Value Ref Range Status   SARS Coronavirus 2 NEGATIVE NEGATIVE Final    Comment: (NOTE) SARS-CoV-2 target nucleic acids are NOT DETECTED.  The SARS-CoV-2 RNA is generally detectable in upper and lower respiratory specimens during the acute phase of infection. The lowest concentration of SARS-CoV-2 viral copies this assay can detect is  250 copies / mL. A negative result does not preclude SARS-CoV-2 infection and should not be used as the sole basis for treatment or other patient management decisions.  A negative result may occur with improper specimen collection / handling, submission of specimen other than nasopharyngeal swab, presence of viral mutation(s) within the areas targeted by this assay, and inadequate number of viral copies (<250 copies / mL). A negative result must be combined with clinical observations, patient history, and epidemiological information.  Fact Sheet for Patients:   StrictlyIdeas.no  Fact Sheet for Healthcare Providers: BankingDealers.co.za  This test is not yet approved or  cleared by the [place] FDA and has been authorized for detection and/or diagnosis of SARS-CoV-2 by FDA under an Emergency Use Authorization (EUA).  This EUA will remain in effect (meaning this test can be used) for the duration of the COVID-19 declaration under Section 564(b)(1) of the Act, 21 U.S.C. section 360bbb-3(b)(1), unless the authorization is terminated or revoked sooner.  Performed at [facility], [address] [place], [place] [address]     Coagulation Studies: No results for input(s): "LABPROT", "INR" in the last 72 hours.  Urinalysis: No results for input(s): "COLORURINE", "LABSPEC", "PHURINE", "GLUCOSEU", "HGBUR", "BILIRUBINUR", "KETONESUR", "PROTEINUR", "UROBILINOGEN", "NITRITE", "LEUKOCYTESUR" in the last 72 hours.  Invalid input(s): "APPERANCEUR"    Imaging: No results found.   Medications:    anticoagulant sodium citrate      amLODipine  10 mg Oral Daily   calcium acetate  667 mg Oral TID WC   carvedilol  25 mg Oral BID WC   Chlorhexidine Gluconate Cloth  6 each Topical Q0600   doxazosin  4 mg Oral Daily   irbesartan  300 mg Oral QPC supper   isosorbide mononitrate  30 mg Oral Once   isosorbide mononitrate  60 mg  Oral Daily   acetaminophen **OR** acetaminophen, albuterol, alteplase, anticoagulant sodium citrate, heparin, heparin, labetalol, lidocaine (PF), lidocaine-prilocaine, LORazepam, morphine injection, ondansetron **OR** ondansetron (ZOFRAN) IV, pentafluoroprop-tetrafluoroeth  Assessment/ Plan:  [name] is a 66 y.o.  female with past medical history including hypertension, diabetes, pancreatitis, cervical cancer, and end-stage renal disease on hemodialysis.  Patient presents to the emergency department from home with complaints of headache and generalized pain.  Patient is admitted under observation for Hypertensive urgency [I16.0] Nonintractable headache, unspecified chronicity pattern, unspecified headache type [R51.9] Hypervolemia, unspecified hypervolemia type [E87.70] Hypertension, unspecified type [I10]   End-stage renal disease on hemodialysis.  Will maintain outpatient schedule as possible.   Receiving treatment today, UF goal 1-1.5L as tolerated. Next treatment scheduled for Monday.  Hypertension with chronic kidney disease.  Home regimen includes amlodipine, doxazosin, and losartan.  Restarted on all home medications.   Blood pressure 163/72 during dialysis.   Anemia of chronic kidney disease Lab Results  Component Value Date   HGB 11.0 (L) 04/01/2022   Patient receives [facility] outpatient.Hgb remains at acceptable  levels  4. Secondary Hyperparathyroidism:  Lab Results  Component Value Date   CALCIUM 8.1 (L) 04/01/2022   PHOS 7.1 (H) 04/01/2022    Calcium acceptable, phosphorus elevated. Continue Calcium acetate with meals. May consider increase to binders.     LOS: 3   7/21/202312:45 PM

## 2022-04-01 NOTE — Progress Notes (Signed)
Received patient in bed, alert and oriented. Informed consent signed and in chart.  Time tx completed: 1220  HD treatment completed.  Patient tolerated well.  Fistula without signs and symptoms of complications.  Patient transported back to the room, alert and orient and in no acute distress.  Report given to bedside RN. Stacie Glaze  Total UF removed: 1.4 L  Medication given: None  Post HD VS: bp 202/68   Hr 74   Rr 19   Temp   Post HD weight: 61.1 kg

## 2022-04-01 NOTE — Plan of Care (Signed)

## 2022-04-01 NOTE — Discharge Instructions (Signed)
Make sure that you take your blood pressure medicines every morning, even on dialysis days.

## 2022-04-01 NOTE — Progress Notes (Addendum)
Tracy Jennings to be D/C'd Home per MD order.  Discussed prescriptions and follow up appointments with the patient and son. Prescriptions given to patient, medication list explained in detail. Pt verbalized understanding.  Vitals:   04/01/22 1459 04/01/22 1601  BP: (!) 99/54 (!) 102/59  Pulse:    Resp: 18   Temp:    SpO2:      Tele box removed and returned. Skin clean, dry and intact without evidence of skin break down, no evidence of skin tears noted. Port cath heparin flushed and deaccessed. Site without signs and symptoms of complications. Dressing and pressure applied. Pt denies pain at this time. No complaints noted.  An After Visit Summary was printed and given to the patient. Patient escorted via [place], and D/C home via private auto.  [name]

## 2022-04-01 NOTE — Progress Notes (Signed)
Walked in patient's room to go over discharge instructions and patient complained of being "lightheaded, dizzy" BP 97/54. MD notified, per MD will order some fluids. Will recheck bp in a hour

## 2022-04-01 NOTE — Discharge Summary (Signed)
Physician Discharge Summary   Patient: Tracy Jennings MRN: [id number] DOB: March 02, 1956  Admit date:     03/27/2022  Discharge date: 04/01/22  Discharge Physician: Annita Brod   PCP: Sharyne Peach, MD   Recommendations at discharge:   New medication: PhosLo 667 mg p.o. 3 times daily New medication: Coreg 25 p.o. twice daily New medication: Imdur 60 mg p.o. daily Patient is advised to take her blood pressure medications every morning, even on dialysis days.  Discharge Diagnoses: Principal Problem:   Dementia with behavioral disturbance ([place]) Active Problems:   Hypertensive emergency   ESRD (end stage renal disease) ([place])   EKG, abnormal   Generalized abdominal pain   Chronic recurrent pancreatitis ([facility])   Anemia of chronic disease   Hyperlipidemia   Headache   Hypertensive urgency  Resolved Problems:   * No resolved hospital problems. *  Hospital Course: 66 year old female with past medical history of end-stage renal disease on hemodialysis, hypertension, diabetes mellitus, peripheral vascular disease and pancreatitis who presented to the emergency room on 7/16 with complaints of headache and abdominal and leg pain/cramping for the past 1 to 2 days.  In the emergency room, patient found to have markedly elevated blood pressure of 295 systolic and an EKG noting T wave inversions in inferior leads.  Chest x-ray consistent with pulmonary edema from volume overload.  Nephrology consulted for hemodialysis which was started on 7/17, however this session was terminated early due to patient agitation as well as tablet malfunction.  Over the next 24 hours, patient continued to have malignant hypertension with blood pressures ranging from the 188C to 166A systolic, along with complaining of intermittent headaches along with some blurry vision.  On 7/19, patient having more having some audio and visual hallucinations.  She also made indirect threats of harm saying that she would stab  someone although she did not make any physical attempts.  Psychiatry consulted.  Following dialysis 7/19, blood pressures have been somewhat better ranging from 90s to 140s, with blood pressures 180s to 200s before dialysis.  With addition of medications, blood pressure much improved.  On day of discharge, patient did not receive her blood pressure medication before going to dialysis and blood pressures stayed elevated.  Patient felt to be stable for discharge and will be important for her to make sure that she takes her blood pressure medications, even on dialysis today.  Assessment and Plan: * Dementia with behavioral disturbance [facility]) Patient continues to have a slow decline in her mentation.  Likely vascular secondary to uncontrolled hypertensive episodes.  Nephrology has had discussions with the patient's son about a living will status.  Psychiatry to evaluate.  Hypertensive emergency Patient states that she has been compliant with her dialysis.  Post dialysis, blood pressures have somewhat improved although still quite elevated.  Have restarted her home medications and may need additional medication support.  Patient declined being on hydralazine due to rash.  Showing signs of endorgan damage given headaches and episodes of worsening confusion.  Added Coreg and Imdur and this plus dialysis seems to have improved her blood pressure.  Plan moving forward will be for patient to make sure that she takes her blood pressure medications, even on dialysis days.  ESRD (end stage renal disease) [facility]) Appreciate nephrology help.  Status post hemodialysis 7/17 (although had to be terminated early due to elevated blood pressure) and 7/19 and 7/21.  Patient will keep a Monday Wednesday Friday schedule.  Adding PhosLo to her discharge  medication regimen.  EKG, abnormal Little change from 1 on admission although T wave inversion in the inferior leads appears to be mostly resolved.  Generalized abdominal  pain Appears to be improved as blood pressure improves.  No signs of obstruction.  Does not appear to be related to her chronic pancreatitis.  Anemia of chronic disease Stable.  Hyperlipidemia Continue statin  Headache Intermittent, secondary to uncontrolled hypertension.  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        Consultants: Nephrology Psychiatry   Procedures: Hemodialysis 7/17, 7/19, 7/21  Disposition: Home Diet recommendation:  Discharge Diet Orders (From admission, onward)     Start     Ordered   04/01/22 0000  Diet renal with fluid restriction        04/01/22 1335           Renal diet DISCHARGE MEDICATION: Allergies as of 04/01/2022       Reactions   Amlodipine Swelling   Knees down to ankles Knees down to ankles   Hctz [hydrochlorothiazide]    pancreatitis   Heparin Other (See Comments)   Pt reports cardiac arrest when given heparin   Lmw Heparin    Reports cardiac arrest when given heparin   Other Other (See Comments)   Reports cardiac arrest when given during surgery Reports cardiac arrest when given during surgery Reports cardiac arrest when given during surgery   Sulfa Antibiotics Rash   Dairy Aid [tilactase]    Runny nose   Hydralazine    Nausea and rash        Medication List     TAKE these medications    amLODipine 10 MG tablet Commonly known as: NORVASC Take 10 mg by mouth daily.   calcium acetate 667 MG capsule Commonly known as: PHOSLO Take 1 capsule (667 mg total) by mouth 3 (three) times daily with meals.   carvedilol 25 MG tablet Commonly known as: COREG Take 1 tablet (25 mg total) by mouth 2 (two) times daily with a meal.   doxazosin 4 MG tablet Commonly known as: CARDURA Take 4 mg by mouth daily.   isosorbide mononitrate 60 MG 24 hr tablet Commonly known as: IMDUR Take 1 tablet (60 mg total) by mouth daily. Start taking on: April 02, 2022   losartan 100 MG  tablet Commonly known as: COZAAR SMARTSIG:1 Tablet(s) By Mouth Every Evening   promethazine 25 MG tablet Commonly known as: PHENERGAN Take 25 mg by mouth every 6 (six) hours as needed for nausea or vomiting.        Discharge Exam: Filed Weights   04/01/22 0429 04/01/22 0837 04/01/22 1217  Weight: 63 kg 62.5 kg 61.1 kg   General: Alert and oriented x3, no acute distress Cardiovascular: Regular rate and rhythm, S1-S2 Lungs: Clear to auscultation bilaterally  Condition at discharge: good  The results of significant diagnostics from this hospitalization (including imaging, microbiology, ancillary and laboratory) are listed below for reference.   Imaging Studies: CT HEAD WO CONTRAST (5MM)  Result Date: 03/29/2022 CLINICAL DATA:  Hypertensive emergency EXAM: CT HEAD WITHOUT CONTRAST TECHNIQUE: Contiguous axial images were obtained from the base of the skull through the vertex without intravenous contrast. RADIATION DOSE REDUCTION: This exam was performed according to the departmental dose-optimization program which includes automated exposure control, adjustment of the mA and/or kV according to patient size and/or use of iterative reconstruction technique. COMPARISON:  CT head October 28, 2019. FINDINGS: Brain: Remote appearing  infarct in the right parietal lobe with some areas of cortical mineralization in this region. No evidence of acute large vascular territory infarct, mass lesion, midline shift, acute hemorrhage or hydrocephalus. Patchy white matter hypodensities, nonspecific but compatible with chronic microvascular disease. Partially empty sella. Vascular: Calcific intracranial atherosclerosis. No hyperdense vessel identified. Skull: No acute fracture. Sinuses/Orbits: Clear visualized sinuses. No acute orbital findings. Other: No mastoid effusions. IMPRESSION: 1. No evidence of acute intracranial abnormality. 2. 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 MRI could provide more sensitive evaluation for acute infarct if clinically warranted. Electronically Signed   By: Margaretha Sheffield M.D.   On: 03/29/2022 16:55   DG Chest Portable 1 View  Result Date: 03/27/2022 CLINICAL DATA:  Shortness of breath.  End-stage renal disease EXAM: PORTABLE CHEST 1 VIEW COMPARISON:  October 29, 2019 FINDINGS: A right Port-A-Cath terminates in the central SVC. No pneumothorax. Bilateral pulmonary opacities are largely interstitial and favored to represent edema given history. The cardiomediastinal silhouette is normal. No pneumothorax. No nodules or masses. IMPRESSION: Probable pulmonary edema. Electronically Signed   By: Dorise Bullion III M.D.   On: 03/27/2022 20:38   MM 3D SCREEN BREAST BILATERAL  Result Date: 03/14/2022 CLINICAL DATA:  Screening. EXAM: DIGITAL SCREENING BILATERAL MAMMOGRAM WITH TOMOSYNTHESIS AND CAD TECHNIQUE: Bilateral screening digital craniocaudal and mediolateral oblique mammograms were obtained. Bilateral screening digital breast tomosynthesis was performed. The images were evaluated with computer-aided detection. COMPARISON:  Previous exam(s). ACR Breast Density Category d: The breast tissue is extremely dense, which lowers the sensitivity of mammography FINDINGS: There are no findings suspicious for malignancy. IMPRESSION: No mammographic evidence of malignancy. A result letter of this screening mammogram will be mailed directly to the patient. RECOMMENDATION: Screening mammogram in one year. (Code:[id number]-B-01Y) BI-RADS CATEGORY  1: Negative. Electronically Signed   By: Claudie Revering M.D.   On: 03/14/2022 17:16    Microbiology: Results for orders placed or performed during the hospital encounter of 07/14/20  SARS Coronavirus 2 by RT PCR (hospital order, performed in [facility] hospital lab) Nasopharyngeal Nasopharyngeal Swab     Status: None   Collection Time: 07/14/20 11:15 AM   Specimen:  Nasopharyngeal Swab  Result Value Ref Range Status   SARS Coronavirus 2 NEGATIVE NEGATIVE Final    Comment: (NOTE) SARS-CoV-2 target nucleic acids are NOT DETECTED.  The SARS-CoV-2 RNA is generally detectable in upper and lower respiratory specimens during the acute phase of infection. The lowest concentration of SARS-CoV-2 viral copies this assay can detect is 250 copies / mL. A negative result does not preclude SARS-CoV-2 infection and should not be used as the sole basis for treatment or other patient management decisions.  A negative result may occur with improper specimen collection / handling, submission of specimen other than nasopharyngeal swab, presence of viral mutation(s) within the areas targeted by this assay, and inadequate number of viral copies (<250 copies / mL). A negative result must be combined with clinical observations, patient history, and epidemiological information.  Fact Sheet for Patients:   StrictlyIdeas.no  Fact Sheet for Healthcare Providers: BankingDealers.co.za  This test is not yet approved or  cleared by the [place] FDA and has been authorized for detection and/or diagnosis of SARS-CoV-2 by FDA under an Emergency Use Authorization (EUA).  This EUA will remain in effect (meaning this test can be used) for the duration of the COVID-19 declaration under Section 564(b)(1) of the Act,  21 U.S.C. section 360bbb-3(b)(1), unless the authorization is terminated or revoked sooner.  Performed at [facility], [address] [place], [place] [address]     Labs: CBC: Recent Labs  Lab 03/27/22 2030 03/28/22 0516 04/01/22 0440  WBC 8.6 7.6 4.9  HGB 11.4* 10.7* 11.0*  HCT 37.2 35.1* 34.8*  MCV 100.8* 101.2* 100.0  PLT 240 234 174   Basic Metabolic Panel: Recent Labs  Lab 03/27/22 2030 03/28/22 0516 03/29/22 0448 04/01/22 0440  NA 138 139 143 140  K 4.8 4.6 4.3 4.4  CL 99 101 104  101  CO2 '22 23 30 25  '$ GLUCOSE 125* 93 102* 99  BUN 62* 66* 54* 67*  CREATININE 8.36* 9.06* 7.73* 8.96*  CALCIUM 8.8* 8.5* 7.8* 8.1*  PHOS  --   --  7.2* 7.1*   Liver Function Tests: Recent Labs  Lab 03/27/22 2030 03/28/22 0516 03/29/22 0448 04/01/22 0440  AST 18 37  --   --   ALT 13 26  --   --   ALKPHOS 60 63  --   --   BILITOT 0.8 0.6  --   --   PROT 7.6 7.4  --   --   ALBUMIN 4.0 3.9 3.5 3.7   CBG: Recent Labs  Lab 03/30/22 0757 03/31/22 0734 04/01/22 0803  GLUCAP 103* 91 94    Discharge time spent: less than 30 minutes.  Signed: Annita Brod, MD [facility] 04/01/2022

## 2022-04-13 ENCOUNTER — Inpatient Hospital Stay: Payer: Medicare Other | Attending: Obstetrics and Gynecology

## 2022-04-13 ENCOUNTER — Inpatient Hospital Stay (HOSPITAL_BASED_OUTPATIENT_CLINIC_OR_DEPARTMENT_OTHER): Payer: Medicare Other | Admitting: Obstetrics and Gynecology

## 2022-04-13 VITALS — BP 177/55 | Resp 18 | Ht 63.0 in | Wt 145.0 lb

## 2022-04-13 DIAGNOSIS — N186 End stage renal disease: Secondary | ICD-10-CM | POA: Diagnosis not present

## 2022-04-13 DIAGNOSIS — Z992 Dependence on renal dialysis: Secondary | ICD-10-CM | POA: Diagnosis not present

## 2022-04-13 DIAGNOSIS — E1136 Type 2 diabetes mellitus with diabetic cataract: Secondary | ICD-10-CM | POA: Insufficient documentation

## 2022-04-13 DIAGNOSIS — Z8541 Personal history of malignant neoplasm of cervix uteri: Secondary | ICD-10-CM | POA: Diagnosis not present

## 2022-04-13 DIAGNOSIS — Z923 Personal history of irradiation: Secondary | ICD-10-CM | POA: Insufficient documentation

## 2022-04-13 DIAGNOSIS — C530 Malignant neoplasm of endocervix: Secondary | ICD-10-CM

## 2022-04-13 DIAGNOSIS — E1122 Type 2 diabetes mellitus with diabetic chronic kidney disease: Secondary | ICD-10-CM | POA: Diagnosis not present

## 2022-04-13 DIAGNOSIS — R34 Anuria and oliguria: Secondary | ICD-10-CM | POA: Insufficient documentation

## 2022-04-13 DIAGNOSIS — R011 Cardiac murmur, unspecified: Secondary | ICD-10-CM

## 2022-04-13 DIAGNOSIS — Z9049 Acquired absence of other specified parts of digestive tract: Secondary | ICD-10-CM | POA: Insufficient documentation

## 2022-04-13 DIAGNOSIS — I120 Hypertensive chronic kidney disease with stage 5 chronic kidney disease or end stage renal disease: Secondary | ICD-10-CM | POA: Insufficient documentation

## 2022-04-13 DIAGNOSIS — C539 Malignant neoplasm of cervix uteri, unspecified: Secondary | ICD-10-CM | POA: Diagnosis present

## 2022-04-13 DIAGNOSIS — Z08 Encounter for follow-up examination after completed treatment for malignant neoplasm: Secondary | ICD-10-CM | POA: Diagnosis not present

## 2022-04-13 NOTE — Progress Notes (Signed)
Gynecologic Oncology Consult Note [facility]  Telephone:([phone] [phone]  Patient Care Team: Sharyne Peach, MD as PCP - General (Family Medicine) Wellington Hampshire, MD as PCP - Cardiology (Cardiology) Noreene Filbert, MD as Radiation Oncologist (Radiation Oncology) Clent Jacks, RN as Oncology Nurse Navigator   Name of the patient: Tracy Jennings  [id number]  13-Oct-1955   Date of visit: 04/13/22  Referring Provider: Dr Leonides Schanz  Chief Concern: Cervical adenocarcinoma  Subjective:  Tracy Jennings is a 66 y.o. female with ESRD on dialysis who is seen in consultation from Dr Leonides Schanz for adenocarcinoma of the cervix diagnosed 6/21 s/p 3 cycles of cisplatin with pelvic radiation followed by brachytherapy 07/10/2020-08/14/2020 who returns to clinic for surveillance.    She completed 3 cycles of cisplatin and pelvic radiation on 10/21 followed by vaginal brachytherapy with Dr. Christel Mormon at [facility] from 07/10/2020-08/14/2020.    She continues to feel well. She has started hemodialysis M-W-F. Has chronic diarrhea since radiation. She is anuric. Feels well otherwise and denies complaints.     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 See prior notes for complete detail.    03/10/20 Colposcopic exam: TZ not seen entirely, AWE not seen. No lesions seen.  ECC was done, random cervical biopsies were taken Pathology 10:00 - suspicious for adenocarcinoma, 6:00 bx and ECC - adenocarcinoma  PET scan 03/13/20 shows some low level FDG activity in the cervix (none in uterus), but no prominent cervical mass.    Cone biopsy 04/08/20  A. CERVIX, CONIZATION BIOPSY:  - INVASIVE ADENOCARCINOMA WITH INVOLVEMENT OF ENDOCERVICAL AND DEEP MARGINS.  B. ENDOCERVIX, CURETTAGE:  - SCANT DETACHED AND DEGENERATED CELLS, SUSPICIOUS FOR MALIGNANCY.  C. ENDOMETRIUM,  CURETTAGE: - INVASIVE ADENOCARCINOMA.   In view of pathology report and PET scan believe this is most likely a primary cervical clear cell adenocarcinoma and she started chemoradiation 05/11/20.   05/21/20-06/08/20- 3 cycles cisplatin 05/13/20-10/7-  EBRT w/ Dr. Baruch Gouty at [facility] 06/21/20 admitted to [facility] for nausea/vomiting/diarrhea due to colitis from radiation 07/06/20-08/13/20- HDR w/ Dr. Christel Mormon at [place] scan 10/21 IMPRESSION: 1. Relatively decompressed, but thick walled colon, consistent with colitis. Favor infection. 2. Decrease in trace abdominopelvic fluid with peritoneal dialysis catheter in place. 3. New hypoattenuation within the superior anterior spleen, suspicious for infarct. 4. Subtle pericystic edema, possibly radiation induced. Recommend clinical exclusion of infectious cystitis.  5. Coronary artery atherosclerosis. Aortic Atherosclerosis 6. Left adrenal nodule, favored to represent an adenoma.  She saw Dr. Christel Mormon at [facility] on 06/15/21.      Medical History History significant for significant vascular disease with end stage renal disease, on peritoneal dialysis and carotid stenosis. She is on transplant list at [facility]. On Plavix/asa for thrombosis prophylaxis. She had a thrombosis of her AVF fistula in 2019 after this was placed and had cardiac arrest post thrombectomy.  She was seen by cardiology on 03/30/2020 and felt to be doing well from cardiac perspective and given clearance for surgery. Echo in 10/2019 revealed EF 60-65%. EKG 03/30/20 demonstrated NSR, 77 bpm, rare PVC, lVH, poor R wave progression along precordial leads, no acute st/t changes. Blood pressure is therapeutically elevated due to history of orthostatic hypotension. She is felt to be low risk for noncardiac surgery. Vascular surgery recommended holding clopidogrel for 5 days prior to surgery and for 2 days post operatively. She continues peritoneal dialysis.  HIV 10/29/19- nonreactive  Oncology History  Cervical  cancer ([place])  03/11/2020 Initial Diagnosis   Cervical cancer ([place])   05/21/2020 - 06/08/2020 Chemotherapy   Patient is on Treatment Plan : CERVICAL Cisplatin q7d (dose reduced secondary to ESRD)      Patient Active Problem List   Diagnosis Date Noted   Heart murmur, systolic 93/81/0175   Dementia with behavioral disturbance ([place]) 03/29/2022   Headache 03/29/2022   Hypertensive urgency 03/29/2022   Hypertensive emergency 03/28/2022   EKG, abnormal 03/28/2022   History of anesthesia reaction 07/06/2020   Failure to thrive in adult    Pressure injury of skin 06/27/2020   Thrush    Anemia of chronic disease    Palliative care by specialist    Generalized abdominal pain    Acute blood loss anemia    Intractable vomiting 06/21/2020   Hypokalemia 06/21/2020   Hyponatremia 06/21/2020   Hypomagnesemia 06/21/2020   Intractable nausea and vomiting 06/21/2020   Nausea vomiting and diarrhea 06/21/2020   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[place]) 03/31/2020   Goals of care, counseling/discussion 03/20/2020   Cervical cancer ([place]) 03/11/2020   Orthostatic hypotension    Syncope 11/04/2019   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   Type 2 diabetes mellitus with other specified complication ([place]) 07/06/8526   Unable to care for self 11/03/2019   Accelerated hypertension 78/24/2353   Metabolic acidosis, increased anion gap (IAG)    Generalized weakness    Recurrent syncope 10/28/2019   HIT (heparin-induced thrombocytopenia) ([place]) 61/44/3154   Complication of vascular access for dialysis 05/11/2018   Hyperlipidemia 04/12/2018   Chronic recurrent pancreatitis ([place]) 11/01/2017   Essential hypertension 11/01/2017   ESRD (end stage renal disease) ([place]) 11/01/2017   Recurrent pancreatitis 11/01/2017   Past Medical History:  Diagnosis Date   Anemia 04/2018   low iron. to be started on supplements   Cervical cancer ([place])    CKD (chronic kidney disease)    Stage IV    Complication of anesthesia    receceived too much anesthesia, that she was in coma for a couple days    COVID-19 virus detected 10/28/2019   Diabetes mellitus without complication (HCC)    type II   ESRD (end stage renal disease) ([place])    Heart murmur    followed as a child only   HSIL (high grade squamous intraepithelial lesion) on Pap smear of cervix    Hyperlipidemia associated with type 2 diabetes mellitus ([employer])    Hypertension    Pancreatitis    Peripheral vascular disease ([place])    Past Surgical History:  Procedure Laterality Date   AMPUTATION TOE Left 2013   2nd toe. tip of toe (toe nail was infected)   AV FISTULA PLACEMENT Left 05/11/2018   Procedure: ARTERIOVENOUS (AV) FISTULA CREATION;  Surgeon: Katha Cabal, MD;  Location: [facility] ORS;  Service: Vascular;  Laterality: Left;   CATARACT EXTRACTION     CERVICAL CONIZATION W/BX N/A 04/08/2020   Procedure: CONIZATION CERVIX WITH BIOPSY;  Surgeon: Mellody Drown, MD;  Location: [facility] ORS;  Service: Gynecology;  Laterality: N/A;   CHOLECYSTECTOMY  2014   COLONOSCOPY     COLONOSCOPY WITH PROPOFOL N/A 01/10/2018   Procedure: COLONOSCOPY WITH PROPOFOL;  Surgeon: Toledo, Benay Pike, MD;  Location: [facility] ENDOSCOPY;  Service: Gastroenterology;  Laterality: N/A;   DIALYSIS/PERMA CATHETER INSERTION N/A 05/21/2018   Procedure: DIALYSIS/PERMA CATHETER INSERTION;  Surgeon: Algernon Huxley, MD;  Location: [place] [facility];  Service: Cardiovascular;  Laterality:  N/A;   DIALYSIS/PERMA CATHETER INSERTION N/A 07/14/2020   Procedure: DIALYSIS/PERMA CATHETER INSERTION;  Surgeon: Algernon Huxley, MD;  Location: [place] [facility];  Service: Cardiovascular;  Laterality: N/A;   DIALYSIS/PERMA CATHETER REMOVAL N/A 04/11/2019   Procedure: DIALYSIS/PERMA CATHETER REMOVAL;  Surgeon: Algernon Huxley, MD;  Location: [place] [facility];  Service: Cardiovascular;  Laterality: N/A;   EYE SURGERY Bilateral 2018   cataract extractions   IR IMAGING GUIDED PORT  INSERTION  06/05/2020   THROMBECTOMY W/ EMBOLECTOMY  05/11/2018   Procedure: THROMBECTOMY ARTERIOVENOUS FISTULA;  Surgeon: Katha Cabal, MD;  Location: [facility] ORS;  Service: Vascular;;   TUBAL LIGATION  1984   Family History  Problem Relation Age of Onset   Stroke Mother    Hypertension Mother    Gout Mother    Cancer Father    Diabetes Sister    Diabetes Maternal Grandmother    Diabetes Maternal Grandfather    Diabetes Paternal Grandmother    Diabetes Paternal Grandfather    Social History   Socioeconomic History   Marital status: Married    Spouse name: clarence   Number of children: 2   Years of education: Not on file   Highest education level: Not on file  Occupational History    Comment: works nights  Tobacco Use   Smoking status: Never   Smokeless tobacco: Never  Vaping Use   Vaping Use: Never used  Substance and Sexual Activity   Alcohol use: No   Drug use: Never   Sexual activity: Not Currently  Other Topics Concern   Not on file  Social History Narrative   Not on file   Social Determinants of Health   Financial Resource Strain: Not on file  Food Insecurity: Not on file  Transportation Needs: Not on file  Physical Activity: Not on file  Stress: Not on file  Social Connections: Not on file   Immunization History  Administered Date(s) Administered   Hepb-cpg 05/02/2019, 07/01/2019   Influenza-Unspecified 07/10/2018, 07/10/2018, 06/24/2019, 06/24/2019, 07/01/2021   PPD Test 06/09/2017, 06/23/2017, 05/15/2018, 05/31/2018, 05/31/2018, 03/11/2019, 03/11/2019, 06/24/2019, 06/24/2019, 08/20/2019, 08/20/2019, 05/13/2021   Pneumococcal Conjugate-13 10/05/2015, 07/11/2018   Pneumococcal Polysaccharide-23 10/04/2013, 07/11/2018, 07/11/2018   Tdap 10/19/2015   Allergies  Allergen Reactions   Amlodipine Swelling    Knees down to ankles Knees down to ankles   Hctz [Hydrochlorothiazide]     pancreatitis   Heparin Other (See Comments)    Pt reports cardiac  arrest when given heparin   Lmw Heparin     Reports cardiac arrest when given heparin   Other Other (See Comments)    Reports cardiac arrest when given during surgery Reports cardiac arrest when given during surgery Reports cardiac arrest when given during surgery    Sulfa Antibiotics Rash   Lactose    Dairy Aid [Tilactase]     Runny nose   Hydralazine     Nausea and rash   Current Outpatient Medications on File Prior to Visit  Medication Sig Dispense Refill   amLODipine (NORVASC) 10 MG tablet Take 10 mg by mouth daily.     calcium acetate (PHOSLO) 667 MG capsule Take 1 capsule (667 mg total) by mouth 3 (three) times daily with meals. 90 capsule 3   carvedilol (COREG) 25 MG tablet Take 1 tablet (25 mg total) by mouth 2 (two) times daily with a meal. 60 tablet 3   cinacalcet (SENSIPAR) 30 MG tablet Take 30 mg by mouth daily.  doxazosin (CARDURA) 4 MG tablet Take 4 mg by mouth daily.     isosorbide mononitrate (IMDUR) 60 MG 24 hr tablet Take 1 tablet (60 mg total) by mouth daily. 30 tablet 2   losartan (COZAAR) 100 MG tablet SMARTSIG:1 Tablet(s) By Mouth Every Evening     promethazine (PHENERGAN) 25 MG tablet Take 25 mg by mouth every 6 (six) hours as needed for nausea or vomiting.     [DISCONTINUED] diltiazem (CARDIZEM CD) 120 MG 24 hr capsule Take 120 mg by mouth daily.     No current facility-administered medications on file prior to visit.   Review of Systems General:  no complaints Skin: no complaints Eyes: no complaints HEENT: no complaints Breasts: no complaints Pulmonary: no complaints Cardiac: no complaints Gastrointestinal: no complaints Genitourinary/Sexual: anuric Ob/Gyn: no complaints Musculoskeletal: no complaints Hematology: no complaints Neurologic/Psych: no complaints   Objective:  Physical Examination:  Today's Vitals   04/13/22 1540  BP: (!) 177/55  Resp: 18  Weight: 145 lb (65.8 kg)  Height: '5\' 3"'$  (1.6 m)  PainSc: 0-No pain   Body mass index  is 25.69 kg/m.  ECOG Performance Status: 1-2  GENERAL: Patient is a well appearing female in no acute distress. Accompanied.  HEENT:  Sclera clear. Anicteric NODES:  Negative axillary, supraclavicular, inguinal lymph node survery LUNGS:  Clear to auscultation bilaterally.   HEART:  Regular rate and rhythm. Systolic murmur, grade 3.  ABDOMEN:  Soft, nontender.  No hernias, incisions well healed. No masses or ascites EXTREMITIES:  BLE edema 2+  SKIN:  Clear with no obvious rashes or skin changes.  NEURO:  Nonfocal. Well oriented.  Appropriate affect.  Pelvic exam: exam chaperoned by CMA: EGBUS: normal.  Vagina: normal atrophic mucosa and agglutinated at the apex. Cervix: no obvious tumor but could only see a small portion of the anterior aspect, smooth Bimanual: normal uterus. No masses. Parametria normal.  RV confirms.  No labs on site today    Assessment:  Tracy Jennings is a 66 y.o. female diagnosed with clear cell adenocarcinoma involving the cervix, unclear if cervical or uterine primary, but strongly favor cervical as this was detected by PAP/cervical biopsy with no bleeding and the bulk of the cancer is in the cervix based on PET scan and cone/D&C.  Clinically she had stage I disease, but difficult to assign substage.  The PET/CT did not really show a tumor mass in the cervix despite some FDG positivity there.  It could also be a primary endometrial cancer, and this cannot be ruled out completely.  No evidence of metastatic disease on PET.   She is s/p 3 cycles of cisplatin and concurrent radiation completed 06/08/20. Treatment complicated by anemia requiring transfusion, nausea, diarrhea, and electrolyte abnormalities due to colitis. Completed brachytherapy with Dr. Christel Mormon 08/14/20 and got blood transfusion for anemia. Saw Dr Christel Mormon 3/22 and PET CT was without disease.  No evidence of disease on exam today.  ESRD on hemodialysis. Renal failure associated anemia.   Medical co-morbidities  complicating care: AODM (not on meds now, but BS high 1/61), HTN (systolic high today), ESRD on dialysis, carotid stenosis on Plavix and ASA, heart murmur (chronic) Plan:   Heart murmur, systolic  RTC in 3 months in Gyn Oncology. She does not have follow up appt with Dr. Christel Mormon and we can do her surveillance visit here.   She will potentially be a candidate for renal transplant in 2024.  The patient's diagnosis, an outline of the further diagnostic and  laboratory studies which will be required, the recommendation, and alternatives were discussed.  All questions were answered to the patient's satisfaction.  Verlon Au, NP  I personally had a face to face interaction and evaluated the patient jointly with the NP, Ms. Beckey Rutter.  I have reviewed her history and available records and have performed the key portions of the physical exam including general, abdominal exam, pelvic exam with my findings confirming those documented above by the APP.  I have discussed the case with the APP and the patient.  I agree with the above documentation, assessment and plan which was fully formulated by me.  Counseling was completed by me.   I personally saw the patient and performed a substantive portion of this encounter in conjunction with the listed APP as documented above.  Konner Saiz Gaetana Michaelis, MD

## 2022-04-13 NOTE — Progress Notes (Signed)
Pt states she just had port flushed last week when she was in the hospital.  Will not flush today and advised to schedule next appt, verbalized understanding

## 2022-04-27 ENCOUNTER — Other Ambulatory Visit
Admission: RE | Admit: 2022-04-27 | Discharge: 2022-04-27 | Disposition: A | Discharge status: Home / Self Care | Payer: Medicare Other | Source: Ambulatory Visit | Attending: Internal Medicine | Admitting: Internal Medicine

## 2022-04-27 DIAGNOSIS — E875 Hyperkalemia: Secondary | ICD-10-CM | POA: Insufficient documentation

## 2022-04-27 LAB — POTASSIUM: Potassium: 6.8 mmol/L (ref 3.5–5.1)

## 2022-07-04 ENCOUNTER — Emergency Department
Admission: EM | Admit: 2022-07-04 | Discharge: 2022-07-04 | Discharge status: Against Medical Advice | Payer: BC Managed Care – PPO | Attending: Emergency Medicine | Admitting: Emergency Medicine

## 2022-07-04 ENCOUNTER — Encounter: Payer: Self-pay | Admitting: Emergency Medicine

## 2022-07-04 ENCOUNTER — Encounter: Payer: Self-pay | Admitting: Oncology

## 2022-07-04 DIAGNOSIS — H5340 Unspecified visual field defects: Secondary | ICD-10-CM | POA: Diagnosis present

## 2022-07-04 DIAGNOSIS — Z5321 Procedure and treatment not carried out due to patient leaving prior to being seen by health care provider: Secondary | ICD-10-CM | POA: Insufficient documentation

## 2022-07-04 NOTE — ED Notes (Signed)
Spoke with Dr. Starleen Blue in regards to patient, recommendation for CT head and basic blood work, pt refused CT states she had one 4 months ago.

## 2022-07-04 NOTE — ED Triage Notes (Signed)
Pt via POV from home. Pt has intermittent episodes of double vision since this AM. Denies any double vision at this time, states that she see and optometrist and they give her shots in her eyes. Pt states that she also been having some redness around her eye. No redness noted by this RN. Pt has not seen her eye doctor. Pt is A&Ox4 and NAD

## 2022-07-04 NOTE — ED Notes (Signed)
Called pts cell number, looking for patient to take to treatment room. Pt reports she called her eye doctor and will be seen there in the morning and does not wish to be seen in ED.

## 2022-07-13 ENCOUNTER — Inpatient Hospital Stay: Payer: Medicare HMO

## 2022-07-13 ENCOUNTER — Inpatient Hospital Stay: Payer: Medicare HMO | Attending: Obstetrics and Gynecology

## 2022-07-15 ENCOUNTER — Emergency Department
Admission: EM | Admit: 2022-07-15 | Discharge: 2022-07-16 | Disposition: A | Discharge status: Home / Self Care | Payer: Medicare HMO | Attending: Emergency Medicine | Admitting: Emergency Medicine

## 2022-07-15 ENCOUNTER — Emergency Department: Payer: Medicare HMO

## 2022-07-15 ENCOUNTER — Other Ambulatory Visit: Payer: Self-pay

## 2022-07-15 DIAGNOSIS — N186 End stage renal disease: Secondary | ICD-10-CM

## 2022-07-15 DIAGNOSIS — F0390 Unspecified dementia without behavioral disturbance: Secondary | ICD-10-CM | POA: Insufficient documentation

## 2022-07-15 DIAGNOSIS — I120 Hypertensive chronic kidney disease with stage 5 chronic kidney disease or end stage renal disease: Secondary | ICD-10-CM | POA: Diagnosis not present

## 2022-07-15 DIAGNOSIS — M546 Pain in thoracic spine: Secondary | ICD-10-CM

## 2022-07-15 DIAGNOSIS — E1122 Type 2 diabetes mellitus with diabetic chronic kidney disease: Secondary | ICD-10-CM | POA: Insufficient documentation

## 2022-07-15 DIAGNOSIS — Z992 Dependence on renal dialysis: Secondary | ICD-10-CM

## 2022-07-15 LAB — COMPREHENSIVE METABOLIC PANEL
ALT: 10 U/L (ref 0–44)
AST: 16 U/L (ref 15–41)
Albumin: 3.6 g/dL (ref 3.5–5.0)
Alkaline Phosphatase: 52 U/L (ref 38–126)
Anion gap: 16 — ABNORMAL HIGH (ref 5–15)
BUN: 45 mg/dL — ABNORMAL HIGH (ref 8–23)
CO2: 23 mmol/L (ref 22–32)
Calcium: 8.3 mg/dL — ABNORMAL LOW (ref 8.9–10.3)
Chloride: 101 mmol/L (ref 98–111)
Creatinine, Ser: 8.74 mg/dL — ABNORMAL HIGH (ref 0.44–1.00)
GFR, Estimated: 5 mL/min — ABNORMAL LOW (ref >60)
Glucose, Bld: 106 mg/dL — ABNORMAL HIGH (ref 70–99)
Potassium: 3.3 mmol/L — ABNORMAL LOW (ref 3.5–5.1)
Sodium: 140 mmol/L (ref 135–145)
Total Bilirubin: 0.8 mg/dL (ref 0.3–1.2)
Total Protein: 8 g/dL (ref 6.5–8.1)

## 2022-07-15 LAB — CBC WITH DIFFERENTIAL/PLATELET
Abs Immature Granulocytes: 0.01 10*3/uL (ref 0.00–0.07)
Basophils Absolute: 0 10*3/uL (ref 0.0–0.1)
Basophils Relative: 1 %
Eosinophils Absolute: 0.1 10*3/uL (ref 0.0–0.5)
Eosinophils Relative: 1 %
HCT: 33.2 % — ABNORMAL LOW (ref 36.0–46.0)
Hemoglobin: 10.3 g/dL — ABNORMAL LOW (ref 12.0–15.0)
Immature Granulocytes: 0 %
Lymphocytes Relative: 8 %
Lymphs Abs: 0.4 10*3/uL — ABNORMAL LOW (ref 0.7–4.0)
MCH: 30 pg (ref 26.0–34.0)
MCHC: 31 g/dL (ref 30.0–36.0)
MCV: 96.8 fL (ref 80.0–100.0)
Monocytes Absolute: 0.4 10*3/uL (ref 0.1–1.0)
Monocytes Relative: 7 %
Neutro Abs: 4 10*3/uL (ref 1.7–7.7)
Neutrophils Relative %: 83 %
Platelets: 222 10*3/uL (ref 150–400)
RBC: 3.43 MIL/uL — ABNORMAL LOW (ref 3.87–5.11)
RDW: 15.5 % (ref 11.5–15.5)
WBC: 4.9 10*3/uL (ref 4.0–10.5)
nRBC: 0 % (ref 0.0–0.2)

## 2022-07-15 LAB — LIPASE, BLOOD: Lipase: 37 U/L (ref 11–51)

## 2022-07-15 MED ORDER — OXYCODONE-ACETAMINOPHEN 5-325 MG PO TABS
1.0000 | ORAL_TABLET | Freq: Once | ORAL | Status: AC
  Administered 2022-07-15: 1 via ORAL
  Filled 2022-07-15: qty 1

## 2022-07-15 MED ORDER — ONDANSETRON 4 MG PO TBDP: 4.0000 mg | ORAL_TABLET | Freq: Three times a day (TID) | ORAL | 0 refills | Status: DC | PRN

## 2022-07-15 MED ORDER — DIAZEPAM 5 MG/ML IJ SOLN
5.0000 mg | Freq: Once | INTRAMUSCULAR | Status: AC
Start: 2022-07-15 — End: 2022-07-15
  Administered 2022-07-15: 5 mg via INTRAVENOUS
  Filled 2022-07-15: qty 2

## 2022-07-15 MED ORDER — OXYCODONE HCL 5 MG PO TABS: 5.0000 mg | ORAL_TABLET | Freq: Four times a day (QID) | ORAL | 0 refills | Status: DC | PRN

## 2022-07-15 MED ORDER — ONDANSETRON 4 MG PO TBDP
4.0000 mg | ORAL_TABLET | Freq: Once | ORAL | Status: AC
  Administered 2022-07-15: 4 mg via ORAL
  Filled 2022-07-15: qty 1

## 2022-07-15 MED ORDER — MORPHINE SULFATE (PF) 4 MG/ML IV SOLN
4.0000 mg | Freq: Once | INTRAVENOUS | Status: AC
  Administered 2022-07-15: 4 mg via INTRAVENOUS
  Filled 2022-07-15: qty 1

## 2022-07-15 MED ORDER — ONDANSETRON HCL 4 MG/2ML IJ SOLN
4.0000 mg | Freq: Once | INTRAMUSCULAR | Status: AC
  Administered 2022-07-15: 4 mg via INTRAVENOUS
  Filled 2022-07-15: qty 2

## 2022-07-15 MED ORDER — IOHEXOL 350 MG/ML SOLN
100.0000 mL | Freq: Once | INTRAVENOUS | Status: AC | PRN
  Administered 2022-07-15: 100 mL via INTRAVENOUS

## 2022-07-15 NOTE — ED Triage Notes (Signed)
Pt c/o back pain and sts the pain started 2 days ago and its been intermittent. Sts that she has a hx of pancreatitis and is concerned this is that. PA Manuela Schwartz in triage room.

## 2022-07-15 NOTE — ED Notes (Signed)
Patient returned from [place]. Blanket provided

## 2022-07-15 NOTE — ED Provider Notes (Incomplete)
-----------------------------------------   11:57 PM on 07/15/2022 -----------------------------------------   CT interpreted per Dr. Jodi Mourning:  1. No acute aortic syndrome. 2. Increased soft tissue thickening about the cervix and rectum since 06/21/2020. This may be radiation induced though recurrent or progressive malignancy is not excluded. There is increased size of a few central mesenteric nodules since 06/21/2020 concerning for metastases. Further evaluation with nonemergent PET-CT is recommended. 3. Increased hypoattenuating areas in the spleen consistent with splenic infarcts since 06/21/2020. 4. Advanced spondylosis in the midthoracic and lower lumbar spine. 5. Aortic Atherosclerosis (ICD10-I70.0).   The patient and family members.  She prefers I prescribed just Oxycodone as needed for pain.  Also prescribed Zofran as she states sometimes Oxycodone will make her nauseous.  Strict return precautions given.  Patient and family members verbalized understanding and agree with plan of care.

## 2022-07-15 NOTE — ED Provider Notes (Signed)
[facility] Provider Note    Event Date/Time   First MD Initiated Contact with Patient 07/15/22 2021     (approximate)   History   Chief Complaint: Back Pain   HPI  Tracy Jennings is a 66 y.o. female with a history of dementia, hypertension, ESRD on hemodialysis, diabetes who comes ED complaining of left upper back pain that started 4 days ago, constant, no aggravating or alleviating factors.  Denies abdominal pain chest pain shortness of breath cough or vomiting.  No fever.  She has secondary anuria due to ESRD.     Physical Exam   Triage Vital Signs: ED Triage Vitals  Enc Vitals Group     BP 07/15/22 1945 (!) 206/76     Pulse Rate 07/15/22 1945 86     Resp 07/15/22 1945 (!) 22     Temp 07/15/22 1945 98.2 F (36.8 C)     Temp Source 07/15/22 1945 Oral     SpO2 07/15/22 1945 100 %     Weight 07/15/22 1943 147 lb 11.3 oz (67 kg)     Height 07/15/22 1943 '5\' 3"'$  (1.6 m)     Head Circumference --      Peak Flow --      Pain Score 07/15/22 1943 7     Pain Loc --      Pain Edu? --      Excl. in [place]? --     Most recent vital signs: Vitals:   07/15/22 2105 07/15/22 2222  BP: (!) 164/84 (!) 198/69  Pulse: 84 78  Resp: 18 14  Temp:    SpO2: 99% 97%    General: Awake, no distress.  CV:  Good peripheral perfusion.  Regular rate and rhythm.  Symmetric distal pulses Resp:  Normal effort.  Clear to auscultation bilaterally Abd:  No distention.  Soft with left upper quadrant tenderness Other:  Back pain not reproducible with palpation of the area.  No musculoskeletal tenderness.  Full range of motion all extremities.   ED Results / Procedures / Treatments   Labs (all labs ordered are listed, but only abnormal results are displayed) Labs Reviewed  COMPREHENSIVE METABOLIC PANEL - Abnormal; Notable for the following components:      Result Value   Potassium 3.3 (*)    Glucose, Bld 106 (*)    BUN 45 (*)    Creatinine, Ser 8.74 (*)     Calcium 8.3 (*)    GFR, Estimated 5 (*)    Anion gap 16 (*)    All other components within normal limits  CBC WITH DIFFERENTIAL/PLATELET - Abnormal; Notable for the following components:   RBC 3.43 (*)    Hemoglobin 10.3 (*)    HCT 33.2 (*)    Lymphs Abs 0.4 (*)    All other components within normal limits  LIPASE, BLOOD     EKG    RADIOLOGY CT angiogram interpreted by me chest abdomen pelvis negative for dissection, pneumothorax, or pericardial effusion., no free air.  Radiology report reviewed   PROCEDURES:  Procedures   MEDICATIONS ORDERED IN ED: Medications  oxyCODONE-acetaminophen (PERCOCET/ROXICET) 5-325 MG per tablet 1 tablet (1 tablet Oral Given 07/15/22 2052)  ondansetron (ZOFRAN-ODT) disintegrating tablet 4 mg (4 mg Oral Given 07/15/22 2009)  morphine (PF) 4 MG/ML injection 4 mg (4 mg Intravenous Given 07/15/22 2228)  diazepam (VALIUM) injection 5 mg (5 mg Intravenous Given 07/15/22 2228)  ondansetron (ZOFRAN) injection 4 mg (4 mg Intravenous Given  07/15/22 2227)  iohexol (OMNIPAQUE) 350 MG/ML injection 100 mL (100 mLs Intravenous Contrast Given 07/15/22 2247)     IMPRESSION / MDM / ASSESSMENT AND PLAN / ED COURSE  I reviewed the triage vital signs and the nursing notes.                              Differential diagnosis includes, but is not limited to, pneumothorax, pneumonia, pleural effusion, pericardial effusion, pancreatitis, gastritis, electrolyte abnormality  Patient's presentation is most consistent with acute presentation with potential threat to life or bodily function.  Patient with diabetes, end-stage renal disease on hemodialysis complains of of persistent upper back pain.  Exam suggestive of perhaps pancreatitis or gastritis.  Serum labs are unremarkable.  Due to her comorbidities, CT angiogram chest abdomen pelvis ordered to evaluate for dissection, occult pulmonary process, or abdominal pathology.       FINAL CLINICAL IMPRESSION(S) / ED  DIAGNOSES   Final diagnoses:  Acute left-sided thoracic back pain  ESRD on hemodialysis ([place])     Rx / DC Orders   ED Discharge Orders     None        Note:  This document was prepared using Dragon voice recognition software and may include unintentional dictation errors.   Carrie Mew, MD 07/15/22 2337

## 2022-07-15 NOTE — ED Provider Triage Note (Signed)
Emergency Medicine Provider Triage Evaluation Note  Tracy Jennings , a 66 y.o. female  was evaluated in triage.  Pt complains of back pain, thinks it is her pancreatitis.  Patient is dialysis patient and did not go today because she was in pain..  Review of Systems  Positive:  Negative:   Physical Exam  BP (!) 206/76 (BP Location: Right Arm)   Pulse 86   Temp 98.2 F (36.8 C) (Oral)   Resp 18   Ht '5\' 3"'$  (1.6 m)   Wt 67 kg   LMP  (LMP Unknown)   SpO2 100%   BMI 26.17 kg/m  Gen:   Awake, no distress   Resp:  Normal effort  MSK:   Moves extremities without difficulty  Other:    Medical Decision Making  Medically screening exam initiated at 7:47 PM.  Appropriate orders placed.  Tracy Jennings was informed that the remainder of the evaluation will be completed by another provider, this initial triage assessment does not replace that evaluation, and the importance of remaining in the ED until their evaluation is complete.     Versie Starks, PA-C 07/15/22 1947

## 2022-07-15 NOTE — ED Notes (Signed)
Pt was supposed to go to dialysis today but sts she was in too much pain so she didn't go.

## 2022-07-15 NOTE — ED Provider Notes (Signed)
-----------------------------------------   11:57 PM on 07/15/2022 -----------------------------------------   CT interpreted per Dr. Jodi Mourning:  1. No acute aortic syndrome. 2. Increased soft tissue thickening about the cervix and rectum since 06/21/2020. This may be radiation induced though recurrent or progressive malignancy is not excluded. There is increased size of a few central mesenteric nodules since 06/21/2020 concerning for metastases. Further evaluation with nonemergent PET-CT is recommended. 3. Increased hypoattenuating areas in the spleen consistent with splenic infarcts since 06/21/2020. 4. Advanced spondylosis in the midthoracic and lower lumbar spine. 5. Aortic Atherosclerosis (ICD10-I70.0).   The patient and family members.  She prefers I prescribed just Oxycodone as needed for pain.  Also prescribed Zofran as she states sometimes Oxycodone will make her nauseous.  Strict return precautions given.  Patient and family members verbalized understanding and agree with plan of care.   Paulette Blanch, MD 07/16/22 [id number]

## 2022-07-15 NOTE — Discharge Instructions (Addendum)
You may take medicines as needed for pain and nausea (Oxycodone/Zofran #15).  Apply moist heat to affected area several times daily.  Return to the ER for worsening symptoms, persistent vomiting, difficulty breathing or other concerns.

## 2022-07-15 NOTE — ED Notes (Signed)
Pt is dry heaving, message sent to provider for nausea meds prior to giving percocet.

## 2022-07-18 ENCOUNTER — Telehealth: Payer: Self-pay

## 2022-07-18 NOTE — Telephone Encounter (Signed)
Ms. Carlin was a no show for her gyn oncology appointment on 07/13/22. Called and left voicemail to return call for reschedule. Recent CT results noted from the ED.

## 2022-08-10 ENCOUNTER — Inpatient Hospital Stay: Payer: Medicare HMO

## 2022-08-10 ENCOUNTER — Telehealth: Payer: Self-pay

## 2022-08-10 NOTE — Telephone Encounter (Signed)
Call placed to Tracy Jennings regarding missed gyn oncology appointment and port flush today. No answer. Left voicemail that she can still be seen today or to call for reschedule.

## 2022-08-10 NOTE — Progress Notes (Deleted)
Patient Care Team: Sharyne Peach, MD as PCP - General (Family Medicine) Wellington Hampshire, MD as PCP - Cardiology (Cardiology) Noreene Filbert, MD as Radiation Oncologist (Radiation Oncology) Clent Jacks, RN as Oncology Nurse Navigator   Name of the patient: Tracy Jennings  [id number]  Feb 04, 1956   Date of visit: 08/10/22  Referring Provider: Dr Leonides Schanz  Chief Concern: Cervical adenocarcinoma  Subjective:  Tracy Jennings is a 66 y.o. female with ESRD on dialysis who is seen in consultation from Dr Leonides Schanz for adenocarcinoma of the cervix diagnosed 6/21 s/p 3 cycles of cisplatin with pelvic radiation followed by brachytherapy 07/10/2020-08/14/2020 who returns to clinic for surveillance.     ***She continues to feel well. She has started hemodialysis M-W-F. Has chronic diarrhea since radiation. She is anuric. Feels well otherwise and denies complaints.   07/15/2022 CT C/A/P IMPRESSION: 1. No acute aortic syndrome. 2. Increased soft tissue thickening about the cervix and rectum since 06/21/2020. This may be radiation induced though recurrent or progressive malignancy is not excluded. There is increased size of a few central mesenteric nodules since 06/21/2020 concerning for metastases. Further evaluation with nonemergent PET-CT is recommended. 3. Increased hypoattenuating areas in the spleen consistent with splenic infarcts since 06/21/2020. 4. Advanced spondylosis in the midthoracic and lower lumbar spine. 5.  Aortic Atherosclerosis (ICD10-I70.0).   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 See prior notes for complete detail.    03/10/20 Colposcopic exam: TZ not seen entirely, AWE not seen. No lesions seen.  ECC was done, random cervical biopsies were taken Pathology 10:00 - suspicious for adenocarcinoma, 6:00 bx and ECC - adenocarcinoma  PET scan 03/13/20 shows  some low level FDG activity in the cervix (none in uterus), but no prominent cervical mass.    Cone biopsy 04/08/20  A. CERVIX, CONIZATION BIOPSY:  - INVASIVE ADENOCARCINOMA WITH INVOLVEMENT OF ENDOCERVICAL AND DEEP MARGINS.  B. ENDOCERVIX, CURETTAGE:  - SCANT DETACHED AND DEGENERATED CELLS, SUSPICIOUS FOR MALIGNANCY.  C. ENDOMETRIUM, CURETTAGE: - INVASIVE ADENOCARCINOMA.   In view of pathology report and PET scan believe this is most likely a primary cervical clear cell adenocarcinoma and she started chemoradiation 05/11/20.   05/21/20-06/08/20- 3 cycles cisplatin 05/13/20-10/7-  EBRT w/ Dr. Baruch Gouty at [facility] 06/21/20 admitted to [facility] for nausea/vomiting/diarrhea due to colitis from radiation 07/06/20-08/13/20- HDR w/ Dr. Christel Mormon at [facility] scan 10/21 IMPRESSION: 1. Relatively decompressed, but thick walled colon, consistent with colitis. Favor infection. 2. Decrease in trace abdominopelvic fluid with peritoneal dialysis catheter in place. 3. New hypoattenuation within the superior anterior spleen, suspicious for infarct. 4. Subtle pericystic edema, possibly radiation induced. Recommend clinical exclusion of infectious cystitis.  5. Coronary artery atherosclerosis. Aortic Atherosclerosis 6. Left adrenal nodule, favored to represent an adenoma.  She saw Dr. Christel Mormon at [facility] on 06/15/21.   07/10/2020-08/14/2020 She completed 3 cycles of cisplatin and pelvic radiation on 10/21 followed by vaginal brachytherapy with Dr. Christel Mormon at [facility].     Medical History History significant for significant vascular disease with end stage renal disease, on peritoneal dialysis and carotid stenosis. She is on transplant list at [facility]. On Plavix/asa for thrombosis prophylaxis. She had a thrombosis of her AVF fistula in 2019 after this was placed and had cardiac arrest post thrombectomy.  She was seen by cardiology on 03/30/2020 and felt to be doing well from cardiac perspective and given clearance for surgery.  Echo in 10/2019  revealed EF 60-65%. EKG 03/30/20 demonstrated NSR, 77 bpm, rare PVC, lVH, poor R wave progression along precordial leads, no acute st/t changes. Blood pressure is therapeutically elevated due to history of orthostatic hypotension. She is felt to be low risk for noncardiac surgery. Vascular surgery recommended holding clopidogrel for 5 days prior to surgery and for 2 days post operatively. She continues peritoneal dialysis.  HIV 10/29/19- nonreactive  Oncology History  Cervical cancer ([place])  03/11/2020 Initial Diagnosis   Cervical cancer ([place])   05/21/2020 - 06/08/2020 Chemotherapy   Patient is on Treatment Plan : CERVICAL Cisplatin q7d (dose reduced secondary to ESRD)      Patient Active Problem List   Diagnosis Date Noted   Heart murmur, systolic 30/05/2329   Dementia with behavioral disturbance ([place]) 03/29/2022   Headache 03/29/2022   Hypertensive urgency 03/29/2022   Hypertensive emergency 03/28/2022   EKG, abnormal 03/28/2022   History of anesthesia reaction 07/06/2020   Failure to thrive in adult    Pressure injury of skin 06/27/2020   Thrush    Anemia of chronic disease    Palliative care by specialist    Generalized abdominal pain    Acute blood loss anemia    Intractable vomiting 06/21/2020   Hypokalemia 06/21/2020   Hyponatremia 06/21/2020   Hypomagnesemia 06/21/2020   Intractable nausea and vomiting 06/21/2020   Nausea vomiting and diarrhea 06/21/2020   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[place]) 03/31/2020   Goals of care, counseling/discussion 03/20/2020   Cervical cancer ([place]) 03/11/2020   Orthostatic hypotension    Syncope 11/04/2019   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   Type 2 diabetes mellitus with other specified complication ([place]) 07/62/2633   Unable to care for self 11/03/2019   Accelerated hypertension 35/45/6256   Metabolic acidosis, increased anion gap (IAG)    Generalized weakness    Recurrent syncope 10/28/2019   HIT (heparin-induced  thrombocytopenia) ([place]) 38/93/7342   Complication of vascular access for dialysis 05/11/2018   Hyperlipidemia 04/12/2018   Chronic recurrent pancreatitis ([place]) 11/01/2017   Essential hypertension 11/01/2017   ESRD (end stage renal disease) ([place]) 11/01/2017   Recurrent pancreatitis 11/01/2017   Past Medical History:  Diagnosis Date   Anemia 04/2018   low iron. to be started on supplements   Cervical cancer ([place])    CKD (chronic kidney disease)    Stage IV   Complication of anesthesia    receceived too much anesthesia, that she was in coma for a couple days    COVID-19 virus detected 10/28/2019   Diabetes mellitus without complication (HCC)    type II   ESRD (end stage renal disease) ([place])    Heart murmur    followed as a child only   HSIL (high grade squamous intraepithelial lesion) on Pap smear of cervix    Hyperlipidemia associated with type 2 diabetes mellitus ([place])    Hypertension    Pancreatitis    Peripheral vascular disease ([place])    Past Surgical History:  Procedure Laterality Date   AMPUTATION TOE Left 2013   2nd toe. tip of toe (toe nail was infected)   AV FISTULA PLACEMENT Left 05/11/2018   Procedure: ARTERIOVENOUS (AV) FISTULA CREATION;  Surgeon: Katha Cabal, MD;  Location: [facility] ORS;  Service: Vascular;  Laterality: Left;   CATARACT EXTRACTION     CERVICAL CONIZATION W/BX N/A 04/08/2020   Procedure: CONIZATION CERVIX WITH BIOPSY;  Surgeon: Mellody Drown, MD;  Location: [facility] ORS;  Service: Gynecology;  Laterality: N/A;  CHOLECYSTECTOMY  2014   COLONOSCOPY     COLONOSCOPY WITH PROPOFOL N/A 01/10/2018   Procedure: COLONOSCOPY WITH PROPOFOL;  Surgeon: Toledo, Benay Pike, MD;  Location: [facility] ENDOSCOPY;  Service: Gastroenterology;  Laterality: N/A;   DIALYSIS/PERMA CATHETER INSERTION N/A 05/21/2018   Procedure: DIALYSIS/PERMA CATHETER INSERTION;  Surgeon: Algernon Huxley, MD;  Location: [place] CV LAB;  Service: Cardiovascular;  Laterality: N/A;   DIALYSIS/PERMA  CATHETER INSERTION N/A 07/14/2020   Procedure: DIALYSIS/PERMA CATHETER INSERTION;  Surgeon: Algernon Huxley, MD;  Location: [place] CV LAB;  Service: Cardiovascular;  Laterality: N/A;   DIALYSIS/PERMA CATHETER REMOVAL N/A 04/11/2019   Procedure: DIALYSIS/PERMA CATHETER REMOVAL;  Surgeon: Algernon Huxley, MD;  Location: [place] [facility];  Service: Cardiovascular;  Laterality: N/A;   EYE SURGERY Bilateral 2018   cataract extractions   IR IMAGING GUIDED PORT INSERTION  06/05/2020   THROMBECTOMY W/ EMBOLECTOMY  05/11/2018   Procedure: THROMBECTOMY ARTERIOVENOUS FISTULA;  Surgeon: Katha Cabal, MD;  Location: [facility] ORS;  Service: Vascular;;   TUBAL LIGATION  1984   Family History  Problem Relation Age of Onset   Stroke Mother    Hypertension Mother    Gout Mother    Cancer Father    Diabetes Sister    Diabetes Maternal Grandmother    Diabetes Maternal Grandfather    Diabetes Paternal Grandmother    Diabetes Paternal Grandfather    Social History   Socioeconomic History   Marital status: Married    Spouse name: clarence   Number of children: 2   Years of education: Not on file   Highest education level: Not on file  Occupational History    Comment: works nights  Tobacco Use   Smoking status: Never   Smokeless tobacco: Never  Vaping Use   Vaping Use: Never used  Substance and Sexual Activity   Alcohol use: No   Drug use: Never   Sexual activity: Not Currently  Other Topics Concern   Not on file  Social History Narrative   Not on file   Social Determinants of Health   Financial Resource Strain: Not on file  Food Insecurity: Not on file  Transportation Needs: Not on file  Physical Activity: Not on file  Stress: Not on file  Social Connections: Not on file   Immunization History  Administered Date(s) Administered   Hepb-cpg 05/02/2019, 07/01/2019   Influenza-Unspecified 07/10/2018, 07/10/2018, 06/24/2019, 06/24/2019, 07/01/2021   PPD Test 06/09/2017, 06/23/2017,  05/15/2018, 05/31/2018, 05/31/2018, 03/11/2019, 03/11/2019, 06/24/2019, 06/24/2019, 08/20/2019, 08/20/2019, 05/13/2021   Pneumococcal Conjugate-13 10/05/2015, 07/11/2018   Pneumococcal Polysaccharide-23 10/04/2013, 07/11/2018, 07/11/2018   Tdap 10/19/2015   Allergies  Allergen Reactions   Amlodipine Swelling    Knees down to ankles Knees down to ankles   Hctz [Hydrochlorothiazide]     pancreatitis   Heparin Other (See Comments)    Pt reports cardiac arrest when given heparin   Lmw Heparin     Reports cardiac arrest when given heparin   Other Other (See Comments)    Reports cardiac arrest when given during surgery Reports cardiac arrest when given during surgery Reports cardiac arrest when given during surgery    Sulfa Antibiotics Rash   Lactose    Dairy Aid [Tilactase]     Runny nose   Hydralazine     Nausea and rash   Current Outpatient Medications on File Prior to Visit  Medication Sig Dispense Refill   amLODipine (NORVASC) 10 MG tablet Take 10 mg by mouth daily.  calcium acetate (PHOSLO) 667 MG capsule Take 1 capsule (667 mg total) by mouth 3 (three) times daily with meals. 90 capsule 3   carvedilol (COREG) 25 MG tablet Take 1 tablet (25 mg total) by mouth 2 (two) times daily with a meal. 60 tablet 3   cinacalcet (SENSIPAR) 30 MG tablet Take 30 mg by mouth daily.     doxazosin (CARDURA) 4 MG tablet Take 4 mg by mouth daily.     isosorbide mononitrate (IMDUR) 60 MG 24 hr tablet Take 1 tablet (60 mg total) by mouth daily. 30 tablet 2   losartan (COZAAR) 100 MG tablet SMARTSIG:1 Tablet(s) By Mouth Every Evening     ondansetron (ZOFRAN-ODT) 4 MG disintegrating tablet Take 1 tablet (4 mg total) by mouth every 8 (eight) hours as needed for nausea or vomiting. 15 tablet 0   oxyCODONE (ROXICODONE) 5 MG immediate release tablet Take 1 tablet (5 mg total) by mouth every 6 (six) hours as needed for up to 15 doses for moderate pain. 15 tablet 0   promethazine (PHENERGAN) 25 MG tablet  Take 25 mg by mouth every 6 (six) hours as needed for nausea or vomiting. (Patient not taking: Reported on 07/15/2022)     [DISCONTINUED] diltiazem (CARDIZEM CD) 120 MG 24 hr capsule Take 120 mg by mouth daily.     No current facility-administered medications on file prior to visit.   Review of Systems General: no complaints  HEENT: no complaints  Lungs: no complaints  Cardiac: no complaints  GI: no complaints  GU: no complaints  Musculoskeletal: no complaints  Extremities: no complaints  Skin: no complaints  Neuro: no complaints  Endocrine: no complaints  Psych: no complaints       Objective:  Physical Examination:  There were no vitals filed for this visit.  There is no height or weight on file to calculate BMI.  ECOG Performance Status: ***  ***GENERAL: Patient is a well appearing female in no acute distress HEENT:  PERRL, neck supple with midline trachea. Thyroid without masses.  NODES:  No cervical, supraclavicular, axillary, or inguinal lymphadenopathy palpated.  LUNGS:  Clear to auscultation bilaterally.  No wheezes or rhonchi. HEART:  Regular rate and rhythm. No murmur appreciated. ABDOMEN:  Soft, nontender.  Positive, normoactive bowel sounds.  MSK:  No focal spinal tenderness to palpation. Full range of motion bilaterally in the upper extremities. EXTREMITIES:  No peripheral edema.   SKIN:  Clear with no obvious rashes or skin changes. No nail dyscrasia. NEURO:  Nonfocal. Well oriented.  Appropriate affect.  ***Pelvic: EGBUS: no lesions Cervix: no lesions, nontender, mobile Vagina: no lesions, no discharge or bleeding Uterus: normal size, nontender, mobile Adnexa: no palpable masses Rectovaginal: confirmatory   GENERAL: Patient is a well appearing female in no acute distress. Accompanied.  HEENT:  Sclera clear. Anicteric NODES:  Negative axillary, supraclavicular, inguinal lymph node survery LUNGS:  Clear to auscultation bilaterally.   HEART:  Regular  rate and rhythm. Systolic murmur, grade 3.  ABDOMEN:  Soft, nontender.  No hernias, incisions well healed. No masses or ascites EXTREMITIES:  BLE edema 2+  SKIN:  Clear with no obvious rashes or skin changes.  NEURO:  Nonfocal. Well oriented.  Appropriate affect.  Pelvic exam: exam chaperoned by CMA: EGBUS: normal.  Vagina: normal atrophic mucosa and agglutinated at the apex. Cervix: no obvious tumor but could only see a small portion of the anterior aspect, smooth Bimanual: normal uterus. No masses. Parametria normal.  RV confirms.  No  labs on site today    Assessment:  Tracy Jennings is a 66 y.o. female diagnosed with clear cell adenocarcinoma involving the cervix, unclear if cervical or uterine primary, but strongly favor cervical as this was detected by PAP/cervical biopsy with no bleeding and the bulk of the cancer is in the cervix based on PET scan and cone/D&C.  Clinically she had stage I disease, but difficult to assign substage.  The PET/CT did not really show a tumor mass in the cervix despite some FDG positivity there.  It could also be a primary endometrial cancer, and this cannot be ruled out completely. She is s/p 3 cycles of cisplatin and concurrent radiation completed 06/08/20. Treatment complicated by anemia requiring transfusion, nausea, diarrhea, and electrolyte abnormalities due to colitis. Completed brachytherapy with Dr. Christel Mormon 08/14/20 and got blood transfusion for anemia.  CT scan concerning for recurrent disease.  ESRD on hemodialysis. Renal failure associated anemia.   Medical co-morbidities complicating care: AODM (not on meds now, but BS high 1/16), HTN (systolic high today), ESRD on dialysis, carotid stenosis on Plavix and ASA, heart murmur (chronic) Plan:   No diagnosis found.  Obtain PET scan. If negative RTC in 3 months in [place]. She does her surveillance visit at [facility].  She will potentially be a candidate for renal transplant in 2024 if she is NED.  The  patient's diagnosis, an outline of the further diagnostic and laboratory studies which will be required, the recommendation, and alternatives were discussed.  All questions were answered to the patient's satisfaction.  Lorn Butcher Gaetana Michaelis, MD  ***I personally had a face to face interaction and evaluated the patient jointly with the NP, Ms. Beckey Rutter.  I have reviewed her history and available records and have performed the key portions of the physical exam including general, abdominal exam, pelvic exam with my findings confirming those documented above by the APP.  I have discussed the case with the APP and the patient.  I agree with the above documentation, assessment and plan which was fully formulated by me.  Counseling was completed by me.   I personally saw the patient and performed a substantive portion of this encounter in conjunction with the listed APP as documented above.  Garan Frappier Gaetana Michaelis, MD

## 2022-08-31 ENCOUNTER — Inpatient Hospital Stay: Payer: Medicare HMO

## 2022-08-31 ENCOUNTER — Inpatient Hospital Stay: Payer: Medicare HMO | Attending: Obstetrics and Gynecology | Admitting: Obstetrics and Gynecology

## 2022-08-31 ENCOUNTER — Encounter: Payer: Self-pay | Admitting: Obstetrics and Gynecology

## 2022-08-31 ENCOUNTER — Other Ambulatory Visit: Payer: Self-pay

## 2022-08-31 VITALS — BP 179/67 | HR 70 | Temp 98.1°F | Resp 18 | Ht 63.0 in | Wt 147.0 lb

## 2022-08-31 DIAGNOSIS — Z923 Personal history of irradiation: Secondary | ICD-10-CM | POA: Diagnosis not present

## 2022-08-31 DIAGNOSIS — Z95828 Presence of other vascular implants and grafts: Secondary | ICD-10-CM

## 2022-08-31 DIAGNOSIS — Z08 Encounter for follow-up examination after completed treatment for malignant neoplasm: Secondary | ICD-10-CM

## 2022-08-31 DIAGNOSIS — M5440 Lumbago with sciatica, unspecified side: Secondary | ICD-10-CM

## 2022-08-31 DIAGNOSIS — Z452 Encounter for adjustment and management of vascular access device: Secondary | ICD-10-CM | POA: Insufficient documentation

## 2022-08-31 DIAGNOSIS — Z9221 Personal history of antineoplastic chemotherapy: Secondary | ICD-10-CM | POA: Diagnosis not present

## 2022-08-31 DIAGNOSIS — Z8541 Personal history of malignant neoplasm of cervix uteri: Secondary | ICD-10-CM

## 2022-08-31 MED ORDER — HEPARIN SOD (PORK) LOCK FLUSH 100 UNIT/ML IV SOLN
500.0000 [IU] | Freq: Once | INTRAVENOUS | Status: AC
  Administered 2022-08-31: 500 [IU] via INTRAVENOUS
  Filled 2022-08-31: qty 5

## 2022-08-31 MED ORDER — SODIUM CHLORIDE 0.9% FLUSH
10.0000 mL | Freq: Once | INTRAVENOUS | Status: AC
  Administered 2022-08-31: 10 mL via INTRAVENOUS
  Filled 2022-08-31: qty 10

## 2022-08-31 NOTE — Progress Notes (Signed)
Gynecologic Oncology Consult Note [facility]  [phone] [phone]  Patient Care Team: Sharyne Peach, MD as PCP - General (Family Medicine) Wellington Hampshire, MD as PCP - Cardiology (Cardiology) Noreene Filbert, MD as Radiation Oncologist (Radiation Oncology) Clent Jacks, RN as Oncology Nurse Navigator   Name of the patient: Tracy Jennings  [id number]  [birth year]   Date of visit: 08/31/22  Referring Provider: Dr Leonides Schanz  Chief Concern: Cervical adenocarcinoma  Subjective:  SHLEY DOLBY is a 66 y.o. female with ESRD on dialysis, initially seen in consultation from Dr Leonides Schanz for adenocarcinoma of the cervix, diagnosed 6/21 s/p 3 cycles of cisplatin with pelvic radiation followed by brachytherapy 07/10/2020-08/14/2020 who returns to clinic for surveillance.    She completed 3 cycles of cisplatin and pelvic radiation on 10/21 followed by vaginal brachytherapy with Dr. Christel Mormon at [facility] from 07/10/2020-08/14/2020.    She reports severe back pain that radiates down her left left. Also has swelling of left leg.   Seen in ER for this on 07/15/22. CTA performed: 1. No acute aortic syndrome. 2. Increased soft tissue thickening about the cervix and rectum since 06/21/2020. This may be radiation induced though recurrent or progressive malignancy is not excluded. There is increased size of a few central mesenteric nodules since 06/21/2020 concerning for metastases. Further evaluation with nonemergent PET-CT is recommended. 3. Increased hypoattenuating areas in the spleen consistent with splenic infarcts since 06/21/2020. 4. Advanced spondylosis in the midthoracic and lower lumbar spine. 5. Aortic Atherosclerosis (ICD10-I70.0).   Re-evaluated in ER on 07/29/22 for back pain again. She saw her pcp for pain who thought it may be secondary to sciatica and prednisone, lidocaine patches, and physical therapy were prescribed.      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 See prior notes for complete detail.    03/10/20 Colposcopic exam: TZ not seen entirely, AWE not seen. No lesions seen.  ECC was done, random cervical biopsies were taken Pathology 10:00 - suspicious for adenocarcinoma, 6:00 bx and ECC - adenocarcinoma  PET scan 03/13/20 shows some low level FDG activity in the cervix (none in uterus), but no prominent cervical mass.    Cone biopsy 04/08/20  A. CERVIX, CONIZATION BIOPSY:  - INVASIVE ADENOCARCINOMA WITH INVOLVEMENT OF ENDOCERVICAL AND DEEP MARGINS.  B. ENDOCERVIX, CURETTAGE:  - SCANT DETACHED AND DEGENERATED CELLS, SUSPICIOUS FOR MALIGNANCY.  C. ENDOMETRIUM, CURETTAGE: - INVASIVE ADENOCARCINOMA.   In view of pathology report and PET scan believe this is most likely a primary cervical clear cell adenocarcinoma and she started chemoradiation 05/11/20.   05/21/20-06/08/20- 3 cycles cisplatin 05/13/20-10/7-  EBRT w/ Dr. Baruch Gouty at [facility] 06/21/20 admitted to [facility] for nausea/vomiting/diarrhea due to colitis from radiation 07/06/20-08/13/20- HDR w/ Dr. Christel Mormon at [facility] scan 10/21 IMPRESSION: 1. Relatively decompressed, but thick walled colon, consistent with colitis. Favor infection. 2. Decrease in trace abdominopelvic fluid with peritoneal dialysis catheter in place. 3. New hypoattenuation within the superior anterior spleen, suspicious for infarct. 4. Subtle pericystic edema, possibly radiation induced. Recommend clinical exclusion of infectious cystitis.  5. Coronary artery atherosclerosis. Aortic Atherosclerosis 6. Left adrenal nodule, favored to represent an adenoma.  She saw Dr. Christel Mormon at [facility] on 06/15/21.    Medical History History significant for significant vascular disease with end stage renal disease, on peritoneal dialysis and carotid stenosis. She is on transplant list at [facility]. On Plavix/asa  for thrombosis  prophylaxis. She had a thrombosis of her AVF fistula in 2019 after this was placed and had cardiac arrest post thrombectomy.  She was seen by cardiology on 03/30/2020 and felt to be doing well from cardiac perspective and given clearance for surgery. Echo in 10/2019 revealed EF 60-65%. EKG 03/30/20 demonstrated NSR, 77 bpm, rare PVC, lVH, poor R wave progression along precordial leads, no acute st/t changes. Blood pressure is therapeutically elevated due to history of orthostatic hypotension. She is felt to be low risk for noncardiac surgery. Vascular surgery recommended holding clopidogrel for 5 days prior to surgery and for 2 days post operatively. She continues peritoneal dialysis.  HIV 10/29/19- nonreactive  Oncology History  Cervical cancer ([place])  03/11/2020 Initial Diagnosis   Cervical cancer ([place])   05/21/2020 - 06/08/2020 Chemotherapy   Patient is on Treatment Plan : CERVICAL Cisplatin q7d (dose reduced secondary to ESRD)      Patient Active Problem List   Diagnosis Date Noted   Heart murmur, systolic 44/09/270   Dementia with behavioral disturbance ([place]) 03/29/2022   Headache 03/29/2022   Hypertensive urgency 03/29/2022   Hypertensive emergency 03/28/2022   EKG, abnormal 03/28/2022   History of anesthesia reaction 07/06/2020   Failure to thrive in adult    Pressure injury of skin 06/27/2020   Thrush    Anemia of chronic disease    Palliative care by specialist    Generalized abdominal pain    Acute blood loss anemia    Intractable vomiting 06/21/2020   Hypokalemia 06/21/2020   Hyponatremia 06/21/2020   Hypomagnesemia 06/21/2020   Intractable nausea and vomiting 06/21/2020   Nausea vomiting and diarrhea 06/21/2020   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[place]) 03/31/2020   Goals of care, counseling/discussion 03/20/2020   Cervical cancer ([place]) 03/11/2020   Orthostatic hypotension    Syncope 11/04/2019   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   Type 2 diabetes  mellitus with other specified complication ([place]) 53/66/4403   Unable to care for self 11/03/2019   Accelerated hypertension 47/42/5956   Metabolic acidosis, increased anion gap (IAG)    Generalized weakness    Recurrent syncope 10/28/2019   HIT (heparin-induced thrombocytopenia) ([place]) 38/75/6433   Complication of vascular access for dialysis 05/11/2018   Hyperlipidemia 04/12/2018   Chronic recurrent pancreatitis ([place]) 11/01/2017   Essential hypertension 11/01/2017   ESRD (end stage renal disease) ([place]) 11/01/2017   Recurrent pancreatitis 11/01/2017   Past Medical History:  Diagnosis Date   Anemia 04/2018   low iron. to be started on supplements   Cervical cancer ([place])    CKD (chronic kidney disease)    Stage IV   Complication of anesthesia    receceived too much anesthesia, that she was in coma for a couple days    COVID-19 virus detected 10/28/2019   Diabetes mellitus without complication (HCC)    type II   ESRD (end stage renal disease) ([place])    Heart murmur    followed as a child only   HSIL (high grade squamous intraepithelial lesion) on Pap smear of cervix    Hyperlipidemia associated with type 2 diabetes mellitus ([place])    Hypertension    Pancreatitis    Peripheral vascular disease ([place])    Past Surgical History:  Procedure Laterality Date   AMPUTATION TOE Left 2013   2nd toe. tip of toe (toe nail was infected)   AV FISTULA PLACEMENT Left 05/11/2018   Procedure: ARTERIOVENOUS (AV) FISTULA CREATION;  Surgeon: Katha Cabal, MD;  Location: [facility] ORS;  Service: Vascular;  Laterality: Left;   CATARACT EXTRACTION     CERVICAL CONIZATION W/BX N/A 04/08/2020   Procedure: CONIZATION CERVIX WITH BIOPSY;  Surgeon: Mellody Drown, MD;  Location: [facility] ORS;  Service: Gynecology;  Laterality: N/A;   CHOLECYSTECTOMY  2014   COLONOSCOPY     COLONOSCOPY WITH PROPOFOL N/A 01/10/2018   Procedure: COLONOSCOPY WITH PROPOFOL;  Surgeon: Tol[place], Benay Pike, MD;  Location: [facility] ENDOSCOPY;   Service: Gastroenterology;  Laterality: N/A;   DIALYSIS/PERMA CATHETER INSERTION N/A 05/21/2018   Procedure: DIALYSIS/PERMA CATHETER INSERTION;  Surgeon: Algernon Huxley, MD;  Location: [place] CV LAB;  Service: Cardiovascular;  Laterality: N/A;   DIALYSIS/PERMA CATHETER INSERTION N/A 07/14/2020   Procedure: DIALYSIS/PERMA CATHETER INSERTION;  Surgeon: Algernon Huxley, MD;  Location: [place] CV LAB;  Service: Cardiovascular;  Laterality: N/A;   DIALYSIS/PERMA CATHETER REMOVAL N/A 04/11/2019   Procedure: DIALYSIS/PERMA CATHETER REMOVAL;  Surgeon: Algernon Huxley, MD;  Location: [place] [facility];  Service: Cardiovascular;  Laterality: N/A;   EYE SURGERY Bilateral 2018   cataract extractions   IR IMAGING GUIDED PORT INSERTION  06/05/2020   THROMBECTOMY W/ EMBOLECTOMY  05/11/2018   Procedure: THROMBECTOMY ARTERIOVENOUS FISTULA;  Surgeon: Katha Cabal, MD;  Location: [facility] ORS;  Service: Vascular;;   TUBAL LIGATION  1984   Family History  Problem Relation Age of Onset   Stroke Mother    Hypertension Mother    Gout Mother    Cancer Father    Diabetes Sister    Diabetes Maternal Grandmother    Diabetes Maternal Grandfather    Diabetes Paternal Grandmother    Diabetes Paternal Grandfather    Social History   Socioeconomic History   Marital status: Married    Spouse name: clarence   Number of children: 2   Years of education: Not on file   Highest education level: Not on file  Occupational History    Comment: works nights  Tobacco Use   Smoking status: Never   Smokeless tobacco: Never  Vaping Use   Vaping Use: Never used  Substance and Sexual Activity   Alcohol use: No   Drug use: Never   Sexual activity: Not Currently  Other Topics Concern   Not on file  Social History Narrative   Not on file   Social Determinants of Health   Financial Resource Strain: Not on file  Food Insecurity: Not on file  Transportation Needs: Not on file  Physical Activity: Not on file   Stress: Not on file  Social Connections: Not on file   Immunization History  Administered Date(s) Administered   Hepb-cpg 05/02/2019, 07/01/2019   Influenza-Unspecified 07/10/2018, 07/10/2018, 06/24/2019, 06/24/2019, 07/01/2021   Moderna Sars-Covid-2 Vaccination 02/05/2020, 03/04/2020   PPD Test 06/09/2017, 06/23/2017, 05/15/2018, 05/31/2018, 05/31/2018, 03/11/2019, 03/11/2019, 06/24/2019, 06/24/2019, 08/20/2019, 08/20/2019, 05/13/2021   Pneumococcal Conjugate-13 10/05/2015, 07/11/2018   Pneumococcal Polysaccharide-23 10/04/2013, 07/11/2018, 07/11/2018   Tdap 10/19/2015   Allergies  Allergen Reactions   Amlodipine Swelling    Knees down to ankles Knees down to ankles   Hctz [Hydrochlorothiazide]     pancreatitis   Heparin Other (See Comments)    Pt reports cardiac arrest when given heparin   Lmw Heparin     Reports cardiac arrest when given heparin   Other Other (See Comments)    Reports cardiac arrest when given during surgery Reports cardiac arrest when given during surgery Reports cardiac arrest when given during surgery    Sulfa Antibiotics Rash  Lactose    Dairy Aid [Tilactase]     Runny nose   Hydralazine     Nausea and rash   Current Outpatient Medications on File Prior to Visit  Medication Sig Dispense Refill   amLODipine (NORVASC) 10 MG tablet Take 10 mg by mouth daily.     calcium acetate (PHOSLO) 667 MG capsule Take 1 capsule (667 mg total) by mouth 3 (three) times daily with meals. 90 capsule 3   carvedilol (COREG) 25 MG tablet Take 1 tablet (25 mg total) by mouth 2 (two) times daily with a meal. 60 tablet 3   cinacalcet (SENSIPAR) 30 MG tablet Take 30 mg by mouth daily.     doxazosin (CARDURA) 4 MG tablet Take 4 mg by mouth daily.     isosorbide mononitrate (IMDUR) 60 MG 24 hr tablet Take 1 tablet (60 mg total) by mouth daily. 30 tablet 2   loperamide (IMODIUM) 2 MG capsule Take 2 mg by mouth as needed for diarrhea or loose stools.     losartan (COZAAR)  100 MG tablet SMARTSIG:1 Tablet(s) By Mouth Every Evening     oxyCODONE (ROXICODONE) 5 MG immediate release tablet Take 1 tablet (5 mg total) by mouth every 6 (six) hours as needed for up to 15 doses for moderate pain. (Patient not taking: Reported on 08/31/2022) 15 tablet 0   [DISCONTINUED] diltiazem (CARDIZEM CD) 120 MG 24 hr capsule Take 120 mg by mouth daily.     No current facility-administered medications on file prior to visit.   Review of Systems General:  no complaints Skin: no complaints Eyes: no complaints HEENT: no complaints Breasts: no complaints Pulmonary: no complaints Cardiac: left leg swelling Gastrointestinal: chronic diarrhea Genitourinary/Sexual: no complaints Ob/Gyn: no complaints Musculoskeletal: back pain Hematology: no complaints Neurologic/Psych: peripheral neuropathy   Objective:  Physical Examination:  Today's Vitals   08/31/22 1545 08/31/22 1553  BP: (!) 179/67   Pulse: 70   Resp: 18   Temp: 98.1 F (36.7 C)   TempSrc: Tympanic   Weight: 147 lb (66.7 kg)   Height: '5\' 3"'$  (1.6 m)   PainSc: 0-No pain 0-No pain   Body mass index is 26.04 kg/m.  ECOG Performance Status: 1-2  GENERAL: uncomfortable appearing. Unaccompanied. Used wheelchair.  HEENT:  Sclera clear. Anicteric NODES:  Negative axillary, supraclavicular, inguinal lymph node survery LUNGS:  Clear to auscultation bilaterally.   HEART:  Regular rate and rhythm. Systolic murmur, grade 3.   ABDOMEN:  Soft, nontender.  No hernias, incisions well healed. No masses or ascites. Unable to lay back for exam.  EXTREMITIES:  left leg edematous 2+. No edema in RLE.  SKIN:  Clear with no obvious rashes or skin changes.  NEURO:  Nonfocal. Well oriented.  Appropriate affect. MSK: bony tenderness to palpation mid back ~ T6-T8  Pelvic exam: exam chaperoned by CMA: EGBUS: normal.  She can not lay on her back and speculum exam can not be performed.   Bimanual: smooth vaginal walls, cervix can not be  palpated. No concerning parametrial involvement. RV deferred.   No labs on site today    Assessment:  Lorra Freeman Benison is a 66 y.o. female diagnosed with clear cell adenocarcinoma involving the cervix, unclear if cervical or uterine primary, but strongly favor cervical as this was detected by PAP/cervical biopsy with no bleeding and the bulk of the cancer is in the cervix based on PET scan and cone/D&C.  Clinically she had stage I disease, but difficult to assign substage.  The PET/CT did not really show a tumor mass in the cervix despite some FDG positivity there.  It could also be a primary endometrial cancer, and this cannot be ruled out completely.  No evidence of metastatic disease on PET.  s/p 3 cycles of cisplatin and concurrent radiation completed 06/08/20. Treatment complicated by anemia requiring transfusion, nausea, diarrhea, and electrolyte abnormalities due to colitis. Completed brachytherapy with Dr. Christel Mormon 08/14/20 and got blood transfusion for anemia. Saw Dr Christel Mormon 3/22 and PET CT was without disease.    Symptoms concerning for recurrence.   ESRD on hemodialysis. Renal failure associated anemia.   Medical co-morbidities complicating care: AODM (not on meds now, but BS high 3/30), HTN (systolic high today), ESRD on dialysis, carotid stenosis on Plavix and ASA, heart murmur (chronic) Plan:   Encounter for follow-up surveillance of cervical cancer - Plan: NM PET Image Restag (PS) Skull Base To Thigh  Port-A-Cath in place  Acute midline low back pain with sciatica, sciatica laterality unspecified - Plan: NM PET Image Restag (PS) Skull Base To Thigh  Follow-up PET scan.  If negative recommended MRI of the lumbar sacral spine.  If all imaging negative she will follow-up in clinic in 3 months for routine surveillance.  She does not have follow up appt with Dr. Christel Mormon and we can do her surveillance visit here.   She will potentially be a candidate for renal transplant in 2024.  The  patient's diagnosis, an outline of the further diagnostic and laboratory studies which will be required, the recommendation, and alternatives were discussed.  All questions were answered to the patient's satisfaction.  Beckey Rutter, NP  I personally had a face to face interaction and evaluated the patient jointly with the NP, Ms. Beckey Rutter.  I have reviewed her history and available records and have performed the key portions of the physical exam including general, abdominal exam, pelvic exam with my findings confirming those documented above by the APP.  I have discussed the case with the APP and the patient.  I agree with the above documentation, assessment and plan which was fully formulated by me.  Counseling was completed by me.   I personally saw the patient and performed a substantive portion of this encounter in conjunction with the listed APP as documented above.  Yanni Ruberg Gaetana Michaelis, MD

## 2022-09-13 ENCOUNTER — Encounter: Payer: Self-pay | Admitting: Oncology

## 2022-09-14 ENCOUNTER — Emergency Department
Admission: EM | Admit: 2022-09-14 | Discharge: 2022-09-14 | Disposition: A | Discharge status: Home / Self Care | Payer: Medicare Other | Attending: Emergency Medicine | Admitting: Emergency Medicine

## 2022-09-14 ENCOUNTER — Encounter: Payer: Self-pay | Admitting: Emergency Medicine

## 2022-09-14 ENCOUNTER — Encounter: Payer: Self-pay | Admitting: Oncology

## 2022-09-14 ENCOUNTER — Other Ambulatory Visit: Payer: Self-pay

## 2022-09-14 ENCOUNTER — Emergency Department: Payer: Medicare Other

## 2022-09-14 DIAGNOSIS — Z20822 Contact with and (suspected) exposure to covid-19: Secondary | ICD-10-CM | POA: Diagnosis not present

## 2022-09-14 DIAGNOSIS — N186 End stage renal disease: Secondary | ICD-10-CM | POA: Insufficient documentation

## 2022-09-14 DIAGNOSIS — R0602 Shortness of breath: Secondary | ICD-10-CM | POA: Diagnosis present

## 2022-09-14 DIAGNOSIS — Z992 Dependence on renal dialysis: Secondary | ICD-10-CM | POA: Diagnosis not present

## 2022-09-14 DIAGNOSIS — M5441 Lumbago with sciatica, right side: Secondary | ICD-10-CM

## 2022-09-14 LAB — CBC WITH DIFFERENTIAL/PLATELET
Abs Immature Granulocytes: 0.01 10*3/uL (ref 0.00–0.07)
Basophils Absolute: 0 10*3/uL (ref 0.0–0.1)
Basophils Relative: 1 %
Eosinophils Absolute: 0.2 10*3/uL (ref 0.0–0.5)
Eosinophils Relative: 4 %
HCT: 31.7 % — ABNORMAL LOW (ref 36.0–46.0)
Hemoglobin: 9.8 g/dL — ABNORMAL LOW (ref 12.0–15.0)
Immature Granulocytes: 0 %
Lymphocytes Relative: 6 %
Lymphs Abs: 0.3 10*3/uL — ABNORMAL LOW (ref 0.7–4.0)
MCH: 30.9 pg (ref 26.0–34.0)
MCHC: 30.9 g/dL (ref 30.0–36.0)
MCV: 100 fL (ref 80.0–100.0)
Monocytes Absolute: 0.3 10*3/uL (ref 0.1–1.0)
Monocytes Relative: 5 %
Neutro Abs: 4.6 10*3/uL (ref 1.7–7.7)
Neutrophils Relative %: 84 %
Platelets: 241 10*3/uL (ref 150–400)
RBC: 3.17 MIL/uL — ABNORMAL LOW (ref 3.87–5.11)
RDW: 15.7 % — ABNORMAL HIGH (ref 11.5–15.5)
WBC: 5.4 10*3/uL (ref 4.0–10.5)
nRBC: 0 % (ref 0.0–0.2)

## 2022-09-14 LAB — COMPREHENSIVE METABOLIC PANEL
ALT: 13 U/L (ref 0–44)
AST: 16 U/L (ref 15–41)
Albumin: 4 g/dL (ref 3.5–5.0)
Alkaline Phosphatase: 40 U/L (ref 38–126)
Anion gap: 22 — ABNORMAL HIGH (ref 5–15)
BUN: 92 mg/dL — ABNORMAL HIGH (ref 8–23)
CO2: 19 mmol/L — ABNORMAL LOW (ref 22–32)
Calcium: 7.7 mg/dL — ABNORMAL LOW (ref 8.9–10.3)
Chloride: 94 mmol/L — ABNORMAL LOW (ref 98–111)
Creatinine, Ser: 13.57 mg/dL — ABNORMAL HIGH (ref 0.44–1.00)
GFR, Estimated: 3 mL/min — ABNORMAL LOW (ref >60)
Glucose, Bld: 95 mg/dL (ref 70–99)
Potassium: 6.2 mmol/L — ABNORMAL HIGH (ref 3.5–5.1)
Sodium: 135 mmol/L (ref 135–145)
Total Bilirubin: 1.9 mg/dL — ABNORMAL HIGH (ref 0.3–1.2)
Total Protein: 8 g/dL (ref 6.5–8.1)

## 2022-09-14 LAB — TROPONIN I (HIGH SENSITIVITY)
Troponin I (High Sensitivity): 62 ng/L — ABNORMAL HIGH (ref <18)
Troponin I (High Sensitivity): 58 ng/L — ABNORMAL HIGH (ref <18)

## 2022-09-14 LAB — RESP PANEL BY RT-PCR (RSV, FLU A&B, COVID)  RVPGX2
Influenza A by PCR: NEGATIVE
Influenza B by PCR: NEGATIVE
Resp Syncytial Virus by PCR: NEGATIVE
SARS Coronavirus 2 by RT PCR: NEGATIVE

## 2022-09-14 LAB — BRAIN NATRIURETIC PEPTIDE: B Natriuretic Peptide: 1655.1 pg/mL — ABNORMAL HIGH (ref 0.0–100.0)

## 2022-09-14 MED ORDER — ACETAMINOPHEN 325 MG PO TABS
650.0000 mg | ORAL_TABLET | Freq: Once | ORAL | Status: AC
  Administered 2022-09-14: 650 mg via ORAL
  Filled 2022-09-14: qty 2

## 2022-09-14 MED ORDER — ANTICOAGULANT SODIUM CITRATE 4% (200MG/5ML) IV SOLN: 5.0000 mL | Status: DC | PRN

## 2022-09-14 MED ORDER — ALTEPLASE 2 MG IJ SOLR: 2.0000 mg | Freq: Once | INTRAMUSCULAR | Status: DC | PRN

## 2022-09-14 MED ORDER — HEPARIN SODIUM (PORCINE) 1000 UNIT/ML DIALYSIS: 1000.0000 [IU] | INTRAMUSCULAR | Status: DC | PRN

## 2022-09-14 MED ORDER — LIDOCAINE HCL (PF) 1 % IJ SOLN: 5.0000 mL | INTRAMUSCULAR | Status: DC | PRN

## 2022-09-14 MED ORDER — PENTAFLUOROPROP-TETRAFLUOROETH EX AERO: 1.0000 | INHALATION_SPRAY | CUTANEOUS | Status: DC | PRN

## 2022-09-14 MED ORDER — CYCLOBENZAPRINE HCL 10 MG PO TABS
5.0000 mg | ORAL_TABLET | Freq: Once | ORAL | Status: AC
  Administered 2022-09-14: 5 mg via ORAL
  Filled 2022-09-14: qty 1

## 2022-09-14 MED ORDER — LIDOCAINE-PRILOCAINE 2.5-2.5 % EX CREA: 1.0000 | TOPICAL_CREAM | CUTANEOUS | Status: DC | PRN

## 2022-09-14 MED ORDER — ONDANSETRON 4 MG PO TBDP: 4.0000 mg | ORAL_TABLET | Freq: Once | ORAL | Status: AC

## 2022-09-14 MED ORDER — ONDANSETRON 4 MG PO TBDP
ORAL_TABLET | ORAL | Status: AC
  Administered 2022-09-14: 4 mg via ORAL
  Filled 2022-09-14: qty 1

## 2022-09-14 MED ORDER — CYCLOBENZAPRINE HCL 10 MG PO TABS: 10.0000 mg | ORAL_TABLET | Freq: Three times a day (TID) | ORAL | 0 refills | Status: DC | PRN

## 2022-09-14 MED ORDER — CHLORHEXIDINE GLUCONATE CLOTH 2 % EX PADS
6.0000 | MEDICATED_PAD | Freq: Every day | CUTANEOUS | Status: DC
  Filled 2022-09-14: qty 6

## 2022-09-14 NOTE — ED Provider Notes (Signed)
[facility] Provider Note   Event Date/Time   First MD Initiated Contact with Patient 09/14/22 [phone]     (approximate) History  Shortness of Breath  HPI Tracy D Sponsel is a 67 y.o. female with stated past medical history of end-stage renal disease on dialysis and/W/F who presents for right-sided back pain that has been present over the last few weeks the patient is concerned that she may have sciatica.  Patient states that she has had a right-sided low back and hip pain that radiates down to her right ankle intermittently.  Patient states that movement exacerbates this pain and she denies any relieving factors ROS: Patient currently denies any vision changes, tinnitus, difficulty speaking, facial droop, sore throat, chest pain, shortness of breath, abdominal pain, nausea/vomiting/diarrhea, dysuria, or weakness/numbness/paresthesias in any extremity   Physical Exam  Triage Vital Signs: ED Triage Vitals  Enc Vitals Group     BP 09/14/22 0232 (!) 197/98     Pulse Rate 09/14/22 0232 65     Resp 09/14/22 0232 16     Temp 09/14/22 0232 98.2 F (36.8 C)     Temp Source 09/14/22 0232 Oral     SpO2 09/14/22 0157 98 %     Weight 09/14/22 0235 140 lb (63.5 kg)     Height 09/14/22 0235 '5\' 3"'$  (1.6 m)     Head Circumference --      Peak Flow --      Pain Score 09/14/22 0233 8     Pain Loc --      Pain Edu? --      Excl. in [place]? --    Most recent vital signs: Vitals:   09/14/22 1000 09/14/22 1117  BP: (!) 210/66 (!) 148/64  Pulse:  76  Resp:  16  Temp:  98.3 F (36.8 C)  SpO2:  97%   General: Awake, oriented x4. CV:  Good peripheral perfusion.  Resp:  Normal effort.  Abd:  No distention.  Other:  Elderly overweight African-American female laying in bed in no acute distress.  Positive straight leg test on the right ED Results / Procedures / Treatments  Labs (all labs ordered are listed, but only abnormal results are displayed) Labs Reviewed  CBC WITH  DIFFERENTIAL/PLATELET - Abnormal; Notable for the following components:      Result Value   RBC 3.17 (*)    Hemoglobin 9.8 (*)    HCT 31.7 (*)    RDW 15.7 (*)    Lymphs Abs 0.3 (*)    All other components within normal limits  COMPREHENSIVE METABOLIC PANEL - Abnormal; Notable for the following components:   Potassium 6.2 (*)    Chloride 94 (*)    CO2 19 (*)    BUN 92 (*)    Creatinine, Ser 13.57 (*)    Calcium 7.7 (*)    Total Bilirubin 1.9 (*)    GFR, Estimated 3 (*)    Anion gap 22 (*)    All other components within normal limits  BRAIN NATRIURETIC PEPTIDE - Abnormal; Notable for the following components:   B Natriuretic Peptide 1,655.1 (*)    All other components within normal limits  TROPONIN I (HIGH SENSITIVITY) - Abnormal; Notable for the following components:   Troponin I (High Sensitivity) 62 (*)    All other components within normal limits  TROPONIN I (HIGH SENSITIVITY) - Abnormal; Notable for the following components:   Troponin I (High Sensitivity) 58 (*)    All  other components within normal limits  RESP PANEL BY RT-PCR (RSV, FLU A&B, COVID)  RVPGX2  HEPATITIS B SURFACE ANTIGEN  HEPATITIS B SURFACE ANTIBODY, QUANTITATIVE   EKG ED ECG REPORT I, Naaman Plummer, the attending physician, personally viewed and interpreted this ECG. Date: 09/14/2022 EKG Time: 0244 Rate: 67 Rhythm: normal sinus rhythm QRS Axis: normal Intervals: normal ST/T Wave abnormalities: normal Narrative Interpretation: no evidence of acute ischemia RADIOLOGY ED MD interpretation: Single view portable chest x-ray interpreted by me and shows increased vascular congestion with pulmonary edema and left pleural effusion -Agree with radiology assessment Official radiology report(s): DG Chest Port 1 View  Result Date: 09/14/2022 CLINICAL DATA:  Shortness of breath and productive cough EXAM: PORTABLE CHEST 1 VIEW COMPARISON:  07/15/2022 CT FINDINGS: Cardiac shadow is within normal limits. Increased  central vascular congestion is noted with left-sided pleural effusion. Left basilar infiltrate is noted. Right chest wall port is noted in satisfactory position. No bony abnormality is noted. IMPRESSION: Increased vascular congestion with pulmonary edema and left pleural effusion. Likely basilar atelectasis on the left is present Electronically Signed   By: Inez Catalina M.D.   On: 09/14/2022 03:10   PROCEDURES: Critical Care performed: No Procedures MEDICATIONS ORDERED IN ED: Medications  Chlorhexidine Gluconate Cloth 2 % PADS 6 each (6 each Topical Not Given 09/14/22 1012)  pentafluoroprop-tetrafluoroeth (GEBAUERS) aerosol 1 Application (has no administration in time range)  lidocaine (PF) (XYLOCAINE) 1 % injection 5 mL (has no administration in time range)  lidocaine-prilocaine (EMLA) cream 1 Application (has no administration in time range)  heparin injection 1,000 Units (has no administration in time range)  anticoagulant sodium citrate solution 5 mL (has no administration in time range)  alteplase (CATHFLO ACTIVASE) injection 2 mg (has no administration in time range)  ondansetron (ZOFRAN-ODT) disintegrating tablet 4 mg (4 mg Oral Given 09/14/22 0256)  acetaminophen (TYLENOL) tablet 650 mg (650 mg Oral Given 09/14/22 1019)  cyclobenzaprine (FLEXERIL) tablet 5 mg (5 mg Oral Given 09/14/22 1019)   IMPRESSION / MDM / ASSESSMENT AND PLAN / ED COURSE  I reviewed the triage vital signs and the nursing notes.                             Patient's presentation is most consistent with acute presentation with potential threat to life or bodily function. Endorses dyspnea, endorses LE edema Endorses Non adherence to dialysis regimen  Workup: ECG, CBC, BMP, Troponin, BNP, CXR Findings: EKG: No STEMI and no evidence of Brugadas sign, delta wave, epsilon wave, significantly prolonged QTc, or malignant arrhythmia. BNP: 1655 CXR: Pulmonary edema and pleural effusions Based on history, exam and findings,  presentation most consistent with acute on chronic heart failure. Low suspicion for PNA, ACS, tamponade, aortic dissection. Interventions: Oxygen, Diuresis  Reassessment: Symptoms improved in ED with oxygen and diuresis  Disposition (ESRD, missed HD): Plan admission to medicine with renal consult for expedited HD. Clinical Course as of 09/14/22 1604  Wed Sep 14, 2022  0912 DG Chest [place] 1 View [EB]    Clinical Course User Index [EB] Naaman Plummer, MD   FINAL CLINICAL IMPRESSION(S) / ED DIAGNOSES   Final diagnoses:  SOB (shortness of breath)  Acute right-sided low back pain with right-sided sciatica   Rx / DC Orders   ED Discharge Orders          Ordered    cyclobenzaprine (FLEXERIL) 10 MG tablet  3 times daily PRN  09/14/22 1045           Note:  This document was prepared using Dragon voice recognition software and may include unintentional dictation errors.   Naaman Plummer, MD 09/14/22 [phone]

## 2022-09-14 NOTE — ED Triage Notes (Signed)
EMS brings pt in from home for c/o [facility]; dialysis Mon-Wed-Fri

## 2022-09-14 NOTE — ED Provider Triage Note (Signed)
Emergency Medicine Provider Triage Evaluation Note  Tracy Jennings , a 67 y.o. female  was evaluated in triage.  Pt complains of shortness of breath.  Review of Systems  Positive: Shortness of breath Negative: Vomiting  Physical Exam  LMP  (LMP Unknown)   SpO2 98%  Gen:   Awake, mild distress   Resp:  Increased effort  MSK:   Moves extremities without difficulty  Other:  No pedal edema  Medical Decision Making  Medically screening exam initiated at 2:31 AM.  Appropriate orders placed.  Tracy Jennings was informed that the remainder of the evaluation will be completed by another provider, this initial triage assessment does not replace that evaluation, and the importance of remaining in the ED until their evaluation is complete.  67 year old female presenting with shortness of breath.  Will obtain cardiac and respiratory panel as well as chest x-ray while patient awaits treatment room.   Paulette Blanch, MD 09/14/22 [phone]

## 2022-09-14 NOTE — ED Triage Notes (Signed)
Pt to ED via EMS from home c/o SOB.  Productive cough.  Also c/o hx of sciatic nerve pain causing pt to not be able to walk which she normally can.  Pt is also dialysis on MWF but missed Monday d/t holiday.  Fistula in left arm.  Pt was placed on 3L [place] with EMS, pt not normally on oxygen, pt remaining 95% RA in triage.  Dr. Beather Arbour in triage for [facility]SE.

## 2022-09-14 NOTE — ED Notes (Signed)
See triage note. Pt to ED with SOB and sciatic pain. Pt missed dyialysis yesterday. L arm restricted.

## 2022-09-14 NOTE — ED Notes (Signed)
Pt will be discharged and safe transport provided for pt to get outpatient dialysis at 12pm.

## 2022-09-14 NOTE — Discharge Summary (Signed)
Patient has been set up at [facility] for an extra treatment today, provided patient can arrive before 12pm. Contacted Jeanna CM to arrange transportation to clinic, clinic stated they will be able to get patient home. Patient is agreeable to this plan.

## 2022-09-16 ENCOUNTER — Telehealth: Payer: Self-pay | Admitting: Obstetrics and Gynecology

## 2022-09-16 NOTE — Telephone Encounter (Signed)
Called patient to let her know PET had to be cancelled due to no auth. Left a voicemail-

## 2022-09-19 ENCOUNTER — Other Ambulatory Visit: Payer: Self-pay | Admitting: Nurse Practitioner

## 2022-09-19 ENCOUNTER — Telehealth: Payer: Self-pay | Admitting: Nurse Practitioner

## 2022-09-19 DIAGNOSIS — M5440 Lumbago with sciatica, unspecified side: Secondary | ICD-10-CM

## 2022-09-19 DIAGNOSIS — C539 Malignant neoplasm of cervix uteri, unspecified: Secondary | ICD-10-CM

## 2022-09-19 MED ORDER — OXYCODONE HCL 5 MG PO TABS: 5.0000 mg | ORAL_TABLET | Freq: Four times a day (QID) | ORAL | 0 refills | Status: DC | PRN

## 2022-09-19 NOTE — Telephone Encounter (Signed)
Peer to peer for PET scan performed and authroized. I reached out to patient to reschedule PET but provided number is not working. Called and spoke to her sister who will reach out to patient. Patient has not returned call. I've asked scheduling to schedule patient at first available and notify her. Unfortunately, patient has worsening symptoms. I also spoke with MD at dialysis today re: patient. She is now in wheelchair and was unable to cmplete dialysis session today d/t pain. I would like to speak to patient re: possible pain medication as well. Awaiting her return call.

## 2022-09-19 NOTE — Progress Notes (Signed)
Spoke to patient and her son. Plan for pet tomorrow at [facility] at 12:00. NPO except water after midnight. I will send prescription for pain medication to her pharmacy for acutely worsening pain. Currently patient is unable to lay flat d/t pain. Advised on use of pain medication for imaging.

## 2022-09-20 ENCOUNTER — Ambulatory Visit: Payer: Medicare Other

## 2022-09-20 ENCOUNTER — Inpatient Hospital Stay: Payer: Medicare Other

## 2022-09-20 ENCOUNTER — Inpatient Hospital Stay: Payer: Medicare Other | Admitting: Nurse Practitioner

## 2022-09-26 ENCOUNTER — Other Ambulatory Visit: Payer: Self-pay | Admitting: Nurse Practitioner

## 2022-09-26 ENCOUNTER — Telehealth: Payer: Self-pay

## 2022-09-26 MED ORDER — DIAZEPAM 5 MG PO TABS: ORAL_TABLET | ORAL | 0 refills | Status: DC

## 2022-09-26 NOTE — Telephone Encounter (Signed)
Spoke with Tracy Jennings regarding PET scan scheduled for tomorrow. Multiple questions answered regarding PET. Transportation has been arranged with the cancer center. She does not have any other transportation. She has requested something to help her relax and get through the scan. NP Lauren has been notified and will send to her pharmacy.

## 2022-09-26 NOTE — Progress Notes (Signed)
Patient requests anti-anxiety medication for PET scan. Prescription sent.

## 2022-09-27 ENCOUNTER — Telehealth: Payer: Self-pay | Admitting: Obstetrics and Gynecology

## 2022-09-27 ENCOUNTER — Inpatient Hospital Stay: Payer: Medicare Other

## 2022-09-27 ENCOUNTER — Ambulatory Visit: Payer: Medicare Other

## 2022-09-27 NOTE — Telephone Encounter (Signed)
Patient called to cancel PET due to having stomach bug. Message sent to team to let them know. Patient states she will call to reschedule when she feels better.

## 2022-09-30 NOTE — Progress Notes (Signed)
Letter sent to Ms. Knee at the request of Dr. Theora Gianotti regarding patient cancelling PET scan that was recommended.

## 2022-10-05 ENCOUNTER — Inpatient Hospital Stay: Payer: Medicare Other | Attending: Obstetrics and Gynecology

## 2022-10-12 ENCOUNTER — Telehealth: Payer: Self-pay

## 2022-10-12 NOTE — Telephone Encounter (Signed)
Letter mailed 09/30/22 regarding concern for missed appointments.

## 2022-10-19 ENCOUNTER — Emergency Department: Payer: Medicare Other

## 2022-10-19 ENCOUNTER — Encounter: Payer: Self-pay | Admitting: Emergency Medicine

## 2022-10-19 ENCOUNTER — Other Ambulatory Visit: Payer: Self-pay

## 2022-10-19 ENCOUNTER — Emergency Department
Admission: EM | Admit: 2022-10-19 | Discharge: 2022-10-19 | Disposition: A | Discharge status: Home / Self Care | Payer: Medicare Other | Attending: Emergency Medicine | Admitting: Emergency Medicine

## 2022-10-19 DIAGNOSIS — N186 End stage renal disease: Secondary | ICD-10-CM | POA: Diagnosis not present

## 2022-10-19 DIAGNOSIS — I10 Essential (primary) hypertension: Secondary | ICD-10-CM

## 2022-10-19 DIAGNOSIS — M7989 Other specified soft tissue disorders: Secondary | ICD-10-CM | POA: Diagnosis not present

## 2022-10-19 DIAGNOSIS — E876 Hypokalemia: Secondary | ICD-10-CM | POA: Diagnosis not present

## 2022-10-19 DIAGNOSIS — Z992 Dependence on renal dialysis: Secondary | ICD-10-CM | POA: Insufficient documentation

## 2022-10-19 DIAGNOSIS — Z8616 Personal history of COVID-19: Secondary | ICD-10-CM | POA: Insufficient documentation

## 2022-10-19 DIAGNOSIS — I120 Hypertensive chronic kidney disease with stage 5 chronic kidney disease or end stage renal disease: Secondary | ICD-10-CM | POA: Diagnosis not present

## 2022-10-19 DIAGNOSIS — E1122 Type 2 diabetes mellitus with diabetic chronic kidney disease: Secondary | ICD-10-CM | POA: Diagnosis not present

## 2022-10-19 DIAGNOSIS — M549 Dorsalgia, unspecified: Secondary | ICD-10-CM | POA: Diagnosis present

## 2022-10-19 LAB — BASIC METABOLIC PANEL
Anion gap: 11 (ref 5–15)
BUN: 8 mg/dL (ref 8–23)
CO2: 26 mmol/L (ref 22–32)
Calcium: 8.3 mg/dL — ABNORMAL LOW (ref 8.9–10.3)
Chloride: 99 mmol/L (ref 98–111)
Creatinine, Ser: 3.76 mg/dL — ABNORMAL HIGH (ref 0.44–1.00)
GFR, Estimated: 13 mL/min — ABNORMAL LOW (ref >60)
Glucose, Bld: 72 mg/dL (ref 70–99)
Potassium: 2.8 mmol/L — ABNORMAL LOW (ref 3.5–5.1)
Sodium: 136 mmol/L (ref 135–145)

## 2022-10-19 LAB — CBC WITH DIFFERENTIAL/PLATELET
Abs Immature Granulocytes: 0.01 10*3/uL (ref 0.00–0.07)
Basophils Absolute: 0.1 10*3/uL (ref 0.0–0.1)
Basophils Relative: 2 %
Eosinophils Absolute: 0.1 10*3/uL (ref 0.0–0.5)
Eosinophils Relative: 3 %
HCT: 32.5 % — ABNORMAL LOW (ref 36.0–46.0)
Hemoglobin: 10.3 g/dL — ABNORMAL LOW (ref 12.0–15.0)
Immature Granulocytes: 0 %
Lymphocytes Relative: 14 %
Lymphs Abs: 0.6 10*3/uL — ABNORMAL LOW (ref 0.7–4.0)
MCH: 30.9 pg (ref 26.0–34.0)
MCHC: 31.7 g/dL (ref 30.0–36.0)
MCV: 97.6 fL (ref 80.0–100.0)
Monocytes Absolute: 0.4 10*3/uL (ref 0.1–1.0)
Monocytes Relative: 8 %
Neutro Abs: 3.1 10*3/uL (ref 1.7–7.7)
Neutrophils Relative %: 73 %
Platelets: 157 10*3/uL (ref 150–400)
RBC: 3.33 MIL/uL — ABNORMAL LOW (ref 3.87–5.11)
RDW: 15.1 % (ref 11.5–15.5)
WBC: 4.2 10*3/uL (ref 4.0–10.5)
nRBC: 0 % (ref 0.0–0.2)

## 2022-10-19 MED ORDER — OXYCODONE-ACETAMINOPHEN 5-325 MG PO TABS
1.0000 | ORAL_TABLET | Freq: Once | ORAL | Status: AC
  Administered 2022-10-19: 1 via ORAL
  Filled 2022-10-19: qty 1

## 2022-10-19 MED ORDER — HEPARIN SOD (PORK) LOCK FLUSH 10 UNIT/ML IV SOLN
10.0000 [IU] | Freq: Once | INTRAVENOUS | Status: AC
  Administered 2022-10-19: 10 [IU]
  Filled 2022-10-19: qty 5

## 2022-10-19 MED ORDER — LABETALOL HCL 5 MG/ML IV SOLN
10.0000 mg | Freq: Once | INTRAVENOUS | Status: AC
  Administered 2022-10-19: 10 mg via INTRAVENOUS
  Filled 2022-10-19: qty 4

## 2022-10-19 MED ORDER — ISOSORBIDE MONONITRATE ER 60 MG PO TB24: 30.0000 mg | ORAL_TABLET | Freq: Every day | ORAL | 0 refills | Status: DC

## 2022-10-19 MED ORDER — ISOSORBIDE MONONITRATE ER 60 MG PO TB24
60.0000 mg | ORAL_TABLET | Freq: Every day | ORAL | Status: DC
  Administered 2022-10-19: 60 mg via ORAL
  Filled 2022-10-19: qty 1

## 2022-10-19 MED ORDER — ONDANSETRON 4 MG PO TBDP
4.0000 mg | ORAL_TABLET | Freq: Once | ORAL | Status: AC
  Administered 2022-10-19: 4 mg via ORAL
  Filled 2022-10-19: qty 1

## 2022-10-19 MED ORDER — CARVEDILOL 25 MG PO TABS
25.0000 mg | ORAL_TABLET | Freq: Once | ORAL | Status: AC
  Administered 2022-10-19: 25 mg via ORAL
  Filled 2022-10-19: qty 1

## 2022-10-19 MED ORDER — ENALAPRILAT 1.25 MG/ML IV SOLN
0.6250 mg | Freq: Once | INTRAVENOUS | Status: AC
  Administered 2022-10-19: 0.625 mg via INTRAVENOUS
  Filled 2022-10-19: qty 2

## 2022-10-19 NOTE — ED Triage Notes (Signed)
Arrives from dialysis via [facility].  Full treatment completed, no fluid pulled.  Patient was HTN prior to dialysis and remained that way after dialysis.  Per report BP:  242/90.  99% HR:  70s  AAOx3.  Skin warm and dry.

## 2022-10-19 NOTE — ED Notes (Signed)
Pt's sister called and informed pt is discharged. Pt's sister informed this RN that she just got home and is unable to come back at the moment, so we would need to keep her until the morning. This RN informed pt that we would not just be able to keep her until the morning. Pt's mother and other sister are unable to come get her d/t not being able to drive at night. Pt's son does not have a valid license. Pt agreed to wait in lobby until sister can pick her up in the AM.

## 2022-10-19 NOTE — Discharge Instructions (Addendum)
Please continue to take your blood pressure medications as prescribed. In addition to the blood pressure medications that you have been prescribed please start taking the Imdur 60 mg daily. If your blood pressures consistently elevated you may need to have your blood pressure medication adjusted.    Your left arm was swollen and we wanted to get an ultrasound to rule out a blood clot but you did not want [place] to do this.  Please follow-up with your nephrologist for further evaluation.

## 2022-10-19 NOTE — ED Provider Notes (Addendum)
[facility] Provider Note    Event Date/Time   First MD Initiated Contact with Patient 10/19/22 1654     (approximate)   History   Hypertension   HPI  Tracy Jennings is a 67 y.o. female past medical history of end-stage renal disease on dialysis, adenocarcinoma of the cervix presents because of elevated blood pressure.  Patient tells me she went to dialysis today.  Blood pressure was elevated in the 250s.  Typically runs around the 160s.  She tells me she takes losartan and amlodipine carvedilol for blood pressure.  Has been out of the losartan but otherwise been taking all of her medications.  She denies any chest pain dyspnea vision change headache abdominal pain.  Does have some chronic mid back pain but this is unchanged.  She tells me that her son thought that she was not acting right today but she feels fine.  Patient tells me she is at her dry weight.  That she has not had fluid taken off with dialysis for some time.     Past Medical History:  Diagnosis Date   Anemia 04/2018   low iron. to be started on supplements   Cervical cancer ([place])    CKD (chronic kidney disease)    Stage IV   Complication of anesthesia    receceived too much anesthesia, that she was in coma for a couple days    COVID-19 virus detected 10/28/2019   Diabetes mellitus without complication ([place])    type II   ESRD (end stage renal disease) ([place])    Heart murmur    followed as a child only   HSIL (high grade squamous intraepithelial lesion) on Pap smear of cervix    Hyperlipidemia associated with type 2 diabetes mellitus ([place])    Hypertension    Pancreatitis    Peripheral vascular disease ([place])     Patient Active Problem List   Diagnosis Date Noted   Heart murmur, systolic 26/71/2458   Dementia with behavioral disturbance ([place]) 03/29/2022   Headache 03/29/2022   Hypertensive urgency 03/29/2022   Hypertensive emergency 03/28/2022   EKG, abnormal 03/28/2022    History of anesthesia reaction 07/06/2020   Failure to thrive in adult    Pressure injury of skin 06/27/2020   Thrush    Anemia of chronic disease    Palliative care by specialist    Generalized abdominal pain    Acute blood loss anemia    Intractable vomiting 06/21/2020   Hypokalemia 06/21/2020   Hyponatremia 06/21/2020   Hypomagnesemia 06/21/2020   Intractable nausea and vomiting 06/21/2020   Nausea vomiting and diarrhea 06/21/2020   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[place]) 03/31/2020   Goals of care, counseling/discussion 03/20/2020   Cervical cancer ([place]) 03/11/2020   Orthostatic hypotension    Syncope 11/04/2019   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   Type 2 diabetes mellitus with other specified complication ([place]) 09/98/3382   Unable to care for self 11/03/2019   Accelerated hypertension 50/53/9767   Metabolic acidosis, increased anion gap ([facility])    Generalized weakness    Recurrent syncope 10/28/2019   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      Excl. in [place]?     Most recent vital signs: Vitals:   10/19/22 2102 10/19/22 2200  BP: (!) 187/68 (!) 158/118  Pulse: 74   Resp: 17   Temp:    SpO2: 96%      General: Awake, no distress.  CV:  Good peripheral perfusion.  Loud systolic murmur Resp:  Normal effort. Lungs are clear Abd:  No distention.  Neuro:              Awake, Alert, Oriented x 3  Other:  Aox3, nml speech  PERRL, EOMI, face symmetric, nml tongue movement  5/5 strength in the BL upper and lower extremities  Sensation grossly intact in the BL upper and lower extremities  Finger-nose-finger intact BL Left upper extremity fistula with thrill Significant swelling of the left forearm, no overlying erythema, elbow is nontender and able to range okay, compartment is soft    ED Results / Procedures / Treatments  Labs (all labs ordered are listed, but only abnormal results are displayed) Labs Reviewed  CBC WITH DIFFERENTIAL/PLATELET - Abnormal; Notable for the following components:      Result Value   RBC 3.33 (*)    Hemoglobin 10.3 (*)    HCT 32.5 (*)    Lymphs Abs 0.6 (*)    All other components within normal limits  BASIC METABOLIC PANEL - Abnormal; Notable for the following components:   Potassium 2.8 (*)    Creatinine, Ser 3.76 (*)    Calcium 8.3 (*)    GFR, Estimated 13 (*)    All other components within normal limits     EKG  EKG reviewed interpreted myself shows sinus rhythm with LVH there is baseline artifact, no acute ischemic changes   RADIOLOGY    PROCEDURES:  Critical Care performed: No  .1-3 Lead EKG Interpretation  Performed by: Rada Hay, MD Authorized by: Rada Hay, MD     Interpretation: normal     ECG rate assessment: normal     Rhythm: sinus rhythm     Ectopy: none     Conduction: normal     The patient is on the cardiac monitor to evaluate for evidence of arrhythmia and/or significant heart rate changes.   MEDICATIONS ORDERED IN ED: Medications  isosorbide mononitrate (IMDUR) 24 hr tablet 60 mg (60 mg Oral Given 10/19/22 1931)  oxyCODONE-acetaminophen (PERCOCET/ROXICET) 5-325 MG per tablet 1 tablet (has no administration in time range)  labetalol (NORMODYNE) injection 10 mg (10 mg Intravenous Given 10/19/22 1829)  enalaprilat (VASOTEC) injection 0.625 mg (0.625 mg  Intravenous Given 10/19/22 1931)  carvedilol (COREG) tablet 25 mg (25 mg Oral Given 10/19/22 2003)     IMPRESSION / MDM / ASSESSMENT AND PLAN / ED COURSE  I reviewed the triage vital signs and the nursing notes.  Patient's presentation is most consistent with acute presentation with potential threat to life or bodily function.  Differential diagnosis includes, but is not limited to, hypertensive emergency, inadequate dialysis, asymptomatic hypertension, medication noncompliance, intoxication   The patient is a 67 year old female presents because of elevated blood pressure.  She is end-stage renal disease and was at dialysis today.  Tells me she does not get fluid taken off for some time because she is at her dry weight.  Blood pressure was over 200 at dialysis.  Tells me she typically runs in the 160s.  She takes amlodipine losartan carvedilol for blood pressure which she has been compliant with.  She is denying symptoms currently denies chest pain headache vision change shortness of breath.  Patient is quite hypertensive blood pressure 240/90.  On exam she looks well she is alert and oriented with nonfocal neurologic exam.  Lungs are clear she has no increased work of breathing.  She does have isolated swelling of the left forearm.  Her fistula has good thrill and apparently there was no issues with the access.  She said that she was told she needed to have the access evaluated.  The forearm is nontender it is not erythematous.  Question DVT versus lymphedema.  's are reassuring she is hypokalemic with potassium 2.8 but will not supplement given she has end-stage renal disease.  EKG not suggestive of acute MI.  Overall I think that this is asymptomatic hypertension but given the magnitude of the elevated blood pressure did give a dose of labetalol which she did not really respond to and then Vasotec, and gave her her home carvedilol and she is prescribed Imdur during last  admission which she has been not been taking.  On recheck in with a new blood pressure cuff she is 180/68.  Patient has not been taking Imdur which she was prescribed at her last admission.  I had ordered a DVT study of the left upper extremity given the swelling.  The ultrasound tech notified me that the patient refused the imaging because she was not able to lay in a position where her arm was accessible because she was having exacerbation of her sciatica.  I explained to the patient why we wanted ultrasound and said that I would give her some pain medication to get her more comfortable but ultimately she wanted to defer the ultrasound.  Recommended she follow-up with her nephrologist for further evaluation.      FINAL CLINICAL IMPRESSION(S) / ED DIAGNOSES   Final diagnoses:  Left arm swelling  Asymptomatic hypertension     Rx / DC Orders   ED Discharge Orders          Ordered    isosorbide mononitrate (IMDUR) 60 MG 24 hr tablet  Daily        10/19/22 2221             Note:  This document was prepared using Dragon voice recognition software and may include unintentional dictation errors.   Rada Hay, MD 10/19/22 2221    Rada Hay, MD 10/19/22 2221

## 2022-10-19 NOTE — ED Notes (Signed)
Lab called to perform blood drawn on pt due to pt being a hard stick. This tech attempted 1 time but the blood hemolyzed before a sufficient specimen could be collected. Lad said "it might be awhlie" but they would come down to perform the blood draw. Pt sitting inn triage middle awaiting lab to come perform blood draw.

## 2022-10-19 NOTE — ED Triage Notes (Signed)
Patient to ED via [facility] from dialysis for hypertension. States she had a full treatment but no fluid pulled off. Given BP med at dialysis. Denies headache.

## 2022-10-20 ENCOUNTER — Ambulatory Visit: Admission: RE | Admit: 2022-10-20 | Payer: Medicare Other | Source: Ambulatory Visit

## 2022-10-28 ENCOUNTER — Ambulatory Visit: Payer: Medicare Other | Attending: Nurse Practitioner

## 2022-11-01 ENCOUNTER — Encounter: Payer: Self-pay | Admitting: Oncology

## 2022-11-23 ENCOUNTER — Other Ambulatory Visit (INDEPENDENT_AMBULATORY_CARE_PROVIDER_SITE_OTHER): Payer: Self-pay | Admitting: Vascular Surgery

## 2022-11-23 DIAGNOSIS — N186 End stage renal disease: Secondary | ICD-10-CM

## 2022-11-23 DIAGNOSIS — T829XXS Unspecified complication of cardiac and vascular prosthetic device, implant and graft, sequela: Secondary | ICD-10-CM

## 2022-11-25 NOTE — Progress Notes (Deleted)
MRN : [id number]  Tracy Jennings is a 67 y.o. (1955/09/30) female who presents with chief complaint of check access.  History of Present Illness:   The patient returns to the office for follow up regarding a problem with their dialysis access.   The patient notes a significant increase in bleeding time after decannulation.  The patient has also been informed that there is increased recirculation.    The patient denies hand pain or other symptoms consistent with steal phenomena.  No significant arm swelling.  The patient denies redness or swelling at the access site. The patient denies fever or chills at home or while on dialysis.  No recent shortening of the patient's walking distance or new symptoms consistent with claudication.  No history of rest pain symptoms. No new ulcers or wounds of the lower extremities have occurred.  The patient denies amaurosis fugax or recent TIA symptoms. There are no recent neurological changes noted. There is no history of DVT, PE or superficial thrombophlebitis. No recent episodes of angina or shortness of breath documented.   Duplex ultrasound of the AV access shows a patent access.  The previously noted stenosis is significantly increased compared to last study.  Flow volume today is *** cc/min (previous flow volume was *** cc/min)  No outpatient medications have been marked as taking for the 11/28/22 encounter (Appointment) with Delana Meyer, Dolores Lory, MD.    Past Medical History:  Diagnosis Date   Anemia 04/2018   low iron. to be started on supplements   Cervical cancer ([place])    CKD (chronic kidney disease)    Stage IV   Complication of anesthesia    receceived too much anesthesia, that she was in coma for a couple days    COVID-19 virus detected 10/28/2019   Diabetes mellitus without complication (HCC)    type II   ESRD (end stage renal disease) ([place])    Heart murmur    followed as a child only   HSIL (high grade  squamous intraepithelial lesion) on Pap smear of cervix    Hyperlipidemia associated with type 2 diabetes mellitus ([place])    Hypertension    Pancreatitis    Peripheral vascular disease ([place])     Past Surgical History:  Procedure Laterality Date   AMPUTATION TOE Left 2013   2nd toe. tip of toe (toe nail was infected)   AV FISTULA PLACEMENT Left 05/11/2018   Procedure: ARTERIOVENOUS (AV) FISTULA CREATION;  Surgeon: Katha Cabal, MD;  Location: [facility] ORS;  Service: Vascular;  Laterality: Left;   CATARACT EXTRACTION     CERVICAL CONIZATION W/BX N/A 04/08/2020   Procedure: CONIZATION CERVIX WITH BIOPSY;  Surgeon: Mellody Drown, MD;  Location: [facility] ORS;  Service: Gynecology;  Laterality: N/A;   CHOLECYSTECTOMY  2014   COLONOSCOPY     COLONOSCOPY WITH PROPOFOL N/A 01/10/2018   Procedure: COLONOSCOPY WITH PROPOFOL;  Surgeon: Toledo, Benay Pike, MD;  Location: [facility] ENDOSCOPY;  Service: Gastroenterology;  Laterality: N/A;   DIALYSIS/PERMA CATHETER INSERTION N/A 05/21/2018   Procedure: DIALYSIS/PERMA CATHETER INSERTION;  Surgeon: Algernon Huxley, MD;  Location: [place] [facility];  Service: Cardiovascular;  Laterality: N/A;   DIALYSIS/PERMA CATHETER INSERTION N/A 07/14/2020   Procedure: DIALYSIS/PERMA CATHETER INSERTION;  Surgeon: Algernon Huxley, MD;  Location: [place] [facility];  Service: Cardiovascular;  Laterality: N/A;   DIALYSIS/PERMA CATHETER REMOVAL  N/A 04/11/2019   Procedure: DIALYSIS/PERMA CATHETER REMOVAL;  Surgeon: Algernon Huxley, MD;  Location: [place] [facility];  Service: Cardiovascular;  Laterality: N/A;   EYE SURGERY Bilateral 2018   cataract extractions   IR IMAGING GUIDED PORT INSERTION  06/05/2020   THROMBECTOMY W/ EMBOLECTOMY  05/11/2018   Procedure: THROMBECTOMY ARTERIOVENOUS FISTULA;  Surgeon: Katha Cabal, MD;  Location: [facility] ORS;  Service: Vascular;;   TUBAL LIGATION  1984    Social History Social History   Tobacco Use   Smoking status: Never   Smokeless tobacco:  Never  Vaping Use   Vaping Use: Never used  Substance Use Topics   Alcohol use: No   Drug use: Never    Family History Family History  Problem Relation Age of Onset   Stroke Mother    Hypertension Mother    Gout Mother    Cancer Father    Diabetes Sister    Diabetes Maternal Grandmother    Diabetes Maternal Grandfather    Diabetes Paternal Grandmother    Diabetes Paternal Grandfather     Allergies  Allergen Reactions   Amlodipine Swelling    Knees down to ankles Knees down to ankles   Hctz [Hydrochlorothiazide]     pancreatitis   Heparin Other (See Comments)    Pt reports cardiac arrest when given heparin   Lmw Heparin     Reports cardiac arrest when given heparin   Other Other (See Comments)    Reports cardiac arrest when given during surgery Reports cardiac arrest when given during surgery Reports cardiac arrest when given during surgery    Sulfa Antibiotics Rash   Lactose    Dairy Aid [Tilactase]     Runny nose   Hydralazine     Nausea and rash     REVIEW OF SYSTEMS (Negative unless checked)  Constitutional: [] Weight loss  [] Fever  [] Chills Cardiac: [] Chest pain   [] Chest pressure   [] Palpitations   [] Shortness of breath when laying flat   [] Shortness of breath with exertion. Vascular:  [] Pain in legs with walking   [] Pain in legs at rest  [] History of DVT   [] Phlebitis   [] Swelling in legs   [] Varicose veins   [] Non-healing ulcers Pulmonary:   [] Uses home oxygen   [] Productive cough   [] Hemoptysis   [] Wheeze  [] COPD   [] Asthma Neurologic:  [] Dizziness   [] Seizures   [] History of stroke   [] History of TIA  [] Aphasia   [] Vissual changes   [] Weakness or numbness in arm   [] Weakness or numbness in leg Musculoskeletal:   [] Joint swelling   [] Joint pain   [] Low back pain Hematologic:  [] Easy bruising  [] Easy bleeding   [] Hypercoagulable state   [] Anemic Gastrointestinal:  [] Diarrhea   [] Vomiting  [] Gastroesophageal reflux/heartburn   [] Difficulty  swallowing. Genitourinary:  [x] Chronic kidney disease   [] Difficult urination  [] Frequent urination   [] Blood in urine Skin:  [] Rashes   [] Ulcers  Psychological:  [] History of anxiety   []  History of major depression.  Physical Examination  There were no vitals filed for this visit. There is no height or weight on file to calculate BMI. Gen: WD/WN, NAD Head: [place]/AT, No temporalis wasting.  Ear/Nose/Throat: Hearing grossly intact, nares w/o erythema or drainage Eyes: PER, EOMI, sclera nonicteric.  Neck: Supple, no gross masses or lesions.  No JVD.  Pulmonary:  Good air movement, no audible wheezing, no use of accessory muscles.  Cardiac: RRR, precordium non-hyperdynamic. Vascular:   *** Vessel Right  Left  Radial Palpable Palpable  Brachial Palpable Palpable  Gastrointestinal: soft, non-distended. No guarding/no peritoneal signs.  Musculoskeletal: M/S 5/5 throughout.  No deformity.  Neurologic: CN 2-12 intact. Pain and light touch intact in extremities.  Symmetrical.  Speech is fluent. Motor exam as listed above. Psychiatric: Judgment intact, Mood & affect appropriate for pt's clinical situation. Dermatologic: No rashes or ulcers noted.  No changes consistent with cellulitis.   CBC Lab Results  Component Value Date   WBC 4.2 10/19/2022   HGB 10.3 (L) 10/19/2022   HCT 32.5 (L) 10/19/2022   MCV 97.6 10/19/2022   PLT 157 10/19/2022    BMET    Component Value Date/Time   NA 136 10/19/2022 1716   NA 139 06/14/2014 0543   K 2.8 (L) 10/19/2022 1716   K 3.6 06/14/2014 0543   CL 99 10/19/2022 1716   CL 105 06/14/2014 0543   CO2 26 10/19/2022 1716   CO2 29 06/14/2014 0543   GLUCOSE 72 10/19/2022 1716   GLUCOSE 152 (H) 06/14/2014 0543   BUN 8 10/19/2022 1716   BUN 21 (H) 06/14/2014 0543   CREATININE 3.76 (H) 10/19/2022 1716   CREATININE 1.04 06/14/2014 0543   CALCIUM 8.3 (L) 10/19/2022 1716   CALCIUM 8.4 (L) 06/14/2014 0543   GFRNONAA 13 (L) 10/19/2022 1716   GFRNONAA 58  (L) 06/14/2014 0543   GFRNONAA >60 12/10/2013 0407   GFRAA 3 (L) 06/15/2020 0841   GFRAA >60 06/14/2014 0543   GFRAA >60 12/10/2013 0407   CrCl cannot be calculated (Patient's most recent lab result is older than the maximum 21 days allowed.).  COAG Lab Results  Component Value Date   INR 1.1 11/03/2019   INR 1.04 05/12/2018   INR 1.07 05/11/2018    Radiology No results found.   Assessment/Plan There are no diagnoses linked to this encounter.   Hortencia Pilar, MD  11/25/2022 12:56 PM

## 2022-11-28 ENCOUNTER — Encounter (INDEPENDENT_AMBULATORY_CARE_PROVIDER_SITE_OTHER): Payer: Medicare Other

## 2022-11-28 ENCOUNTER — Encounter (INDEPENDENT_AMBULATORY_CARE_PROVIDER_SITE_OTHER): Payer: Medicare Other | Admitting: Vascular Surgery

## 2022-11-28 DIAGNOSIS — E1169 Type 2 diabetes mellitus with other specified complication: Secondary | ICD-10-CM

## 2022-11-28 DIAGNOSIS — D75829 Heparin-induced thrombocytopenia, unspecified: Secondary | ICD-10-CM

## 2022-11-28 DIAGNOSIS — I10 Essential (primary) hypertension: Secondary | ICD-10-CM

## 2022-11-28 DIAGNOSIS — N186 End stage renal disease: Secondary | ICD-10-CM

## 2022-11-28 DIAGNOSIS — E782 Mixed hyperlipidemia: Secondary | ICD-10-CM

## 2023-01-26 ENCOUNTER — Encounter (INDEPENDENT_AMBULATORY_CARE_PROVIDER_SITE_OTHER): Payer: Medicare Other

## 2023-01-26 ENCOUNTER — Encounter (INDEPENDENT_AMBULATORY_CARE_PROVIDER_SITE_OTHER): Payer: Medicare Other | Admitting: Vascular Surgery

## 2023-01-26 NOTE — Progress Notes (Deleted)
MRN : [id number]  Tracy Jennings is a 67 y.o. (04/16/1956) female who presents with chief complaint of check access.  History of Present Illness:   The patient returns to the office for follow up regarding a problem with their dialysis access.   The patient notes a significant increase in bleeding time after decannulation.  The patient has also been informed that there is increased recirculation.    The patient denies hand pain or other symptoms consistent with steal phenomena.  No significant arm swelling.  The patient denies redness or swelling at the access site. The patient denies fever or chills at home or while on dialysis.  No recent shortening of the patient's walking distance or new symptoms consistent with claudication.  No history of rest pain symptoms. No new ulcers or wounds of the lower extremities have occurred.  The patient denies amaurosis fugax or recent TIA symptoms. There are no recent neurological changes noted. There is no history of DVT, PE or superficial thrombophlebitis. No recent episodes of angina or shortness of breath documented.   Duplex ultrasound of the AV access shows a patent access.  The previously noted stenosis is significantly increased compared to last study.  Flow volume today is *** cc/min (previous flow volume was *** cc/min)  No outpatient medications have been marked as taking for the 01/26/23 encounter (Appointment) with Gilda Crease, Latina Craver, MD.    Past Medical History:  Diagnosis Date   Anemia 04/2018   low iron. to be started on supplements   Cervical cancer (HCC)    CKD (chronic kidney disease)    Stage IV   Complication of anesthesia    receceived too much anesthesia, that she was in coma for a couple days    COVID-19 virus detected 10/28/2019   Diabetes mellitus without complication (HCC)    type II   ESRD (end stage renal disease) (HCC)    Heart murmur    followed as a child only   HSIL (high grade  squamous intraepithelial lesion) on Pap smear of cervix    Hyperlipidemia associated with type 2 diabetes mellitus (HCC)    Hypertension    Pancreatitis    Peripheral vascular disease (HCC)     Past Surgical History:  Procedure Laterality Date   AMPUTATION TOE Left 2013   2nd toe. tip of toe (toe nail was infected)   AV FISTULA PLACEMENT Left 05/11/2018   Procedure: ARTERIOVENOUS (AV) FISTULA CREATION;  Surgeon: Renford Dills, MD;  Location: [facility];  Service: Vascular;  Laterality: Left;   CATARACT EXTRACTION     CERVICAL CONIZATION W/BX N/A 04/08/2020   Procedure: CONIZATION CERVIX WITH BIOPSY;  Surgeon: Leida Lauth, MD;  Location: [facility];  Service: Gynecology;  Laterality: N/A;   CHOLECYSTECTOMY  2014   COLONOSCOPY     COLONOSCOPY WITH PROPOFOL N/A 01/10/2018   Procedure: COLONOSCOPY WITH PROPOFOL;  Surgeon: Toledo, Boykin Nearing, MD;  Location: [facility];  Service: Gastroenterology;  Laterality: N/A;   DIALYSIS/PERMA CATHETER INSERTION N/A 05/21/2018   Procedure: DIALYSIS/PERMA CATHETER INSERTION;  Surgeon: Annice Needy, MD;  Location: [facility];  Service: Cardiovascular;  Laterality: N/A;   DIALYSIS/PERMA CATHETER INSERTION N/A 07/14/2020   Procedure: DIALYSIS/PERMA CATHETER INSERTION;  Surgeon: Annice Needy, MD;  Location: [facility];  Service: Cardiovascular;  Laterality: N/A;   DIALYSIS/PERMA CATHETER REMOVAL N/A 04/11/2019  Procedure: DIALYSIS/PERMA CATHETER REMOVAL;  Surgeon: Annice Needy, MD;  Location: [facility];  Service: Cardiovascular;  Laterality: N/A;   EYE SURGERY Bilateral 2018   cataract extractions   IR IMAGING GUIDED PORT INSERTION  06/05/2020   THROMBECTOMY W/ EMBOLECTOMY  05/11/2018   Procedure: THROMBECTOMY ARTERIOVENOUS FISTULA;  Surgeon: Renford Dills, MD;  Location: [facility];  Service: Vascular;;   TUBAL LIGATION  1984    Social History Social History   Tobacco Use   Smoking status: Never   Smokeless tobacco:  Never  Vaping Use   Vaping Use: Never used  Substance Use Topics   Alcohol use: No   Drug use: Never    Family History Family History  Problem Relation Age of Onset   Stroke Mother    Hypertension Mother    Gout Mother    Cancer Father    Diabetes Sister    Diabetes Maternal Grandmother    Diabetes Maternal Grandfather    Diabetes Paternal Grandmother    Diabetes Paternal Grandfather     Allergies  Allergen Reactions   Amlodipine Swelling    Knees down to ankles Knees down to ankles   Hctz [Hydrochlorothiazide]     pancreatitis   Heparin Other (See Comments)    Pt reports cardiac arrest when given heparin   Lmw Heparin     Reports cardiac arrest when given heparin   Other Other (See Comments)    Reports cardiac arrest when given during surgery Reports cardiac arrest when given during surgery Reports cardiac arrest when given during surgery    Sulfa Antibiotics Rash   Lactose    Dairy Aid [Tilactase]     Runny nose   Hydralazine     Nausea and rash     REVIEW OF SYSTEMS (Negative unless checked)  Constitutional: [] Weight loss  [] Fever  [] Chills Cardiac: [] Chest pain   [] Chest pressure   [] Palpitations   [] Shortness of breath when laying flat   [] Shortness of breath with exertion. Vascular:  [] Pain in legs with walking   [] Pain in legs at rest  [] History of DVT   [] Phlebitis   [] Swelling in legs   [] Varicose veins   [] Non-healing ulcers Pulmonary:   [] Uses home oxygen   [] Productive cough   [] Hemoptysis   [] Wheeze  [] COPD   [] Asthma Neurologic:  [] Dizziness   [] Seizures   [] History of stroke   [] History of TIA  [] Aphasia   [] Vissual changes   [] Weakness or numbness in arm   [] Weakness or numbness in leg Musculoskeletal:   [] Joint swelling   [] Joint pain   [] Low back pain Hematologic:  [] Easy bruising  [] Easy bleeding   [] Hypercoagulable state   [] Anemic Gastrointestinal:  [] Diarrhea   [] Vomiting  [] Gastroesophageal reflux/heartburn   [] Difficulty  swallowing. Genitourinary:  [x] Chronic kidney disease   [] Difficult urination  [] Frequent urination   [] Blood in urine Skin:  [] Rashes   [] Ulcers  Psychological:  [] History of anxiety   []  History of major depression.  Physical Examination  There were no vitals filed for this visit. There is no height or weight on file to calculate BMI. Gen: WD/WN, NAD Head: [place]/AT, No temporalis wasting.  Ear/Nose/Throat: Hearing grossly intact, nares w/o erythema or drainage Eyes: PER, EOMI, sclera nonicteric.  Neck: Supple, no gross masses or lesions.  No JVD.  Pulmonary:  Good air movement, no audible wheezing, no use of accessory muscles.  Cardiac: RRR, precordium non-hyperdynamic. Vascular:   *** Vessel Right Left  Radial Palpable  Palpable  Brachial Palpable Palpable  Gastrointestinal: soft, non-distended. No guarding/no peritoneal signs.  Musculoskeletal: M/S 5/5 throughout.  No deformity.  Neurologic: CN 2-12 intact. Pain and light touch intact in extremities.  Symmetrical.  Speech is fluent. Motor exam as listed above. Psychiatric: Judgment intact, Mood & affect appropriate for pt's clinical situation. Dermatologic: No rashes or ulcers noted.  No changes consistent with cellulitis.   CBC Lab Results  Component Value Date   WBC 4.2 10/19/2022   HGB 10.3 (L) 10/19/2022   HCT 32.5 (L) 10/19/2022   MCV 97.6 10/19/2022   PLT 157 10/19/2022    BMET    Component Value Date/Time   NA 136 10/19/2022 1716   NA 139 06/14/2014 0543   K 2.8 (L) 10/19/2022 1716   K 3.6 06/14/2014 0543   CL 99 10/19/2022 1716   CL 105 06/14/2014 0543   CO2 26 10/19/2022 1716   CO2 29 06/14/2014 0543   GLUCOSE 72 10/19/2022 1716   GLUCOSE 152 (H) 06/14/2014 0543   BUN 8 10/19/2022 1716   BUN 21 (H) 06/14/2014 0543   CREATININE 3.76 (H) 10/19/2022 1716   CREATININE 1.04 06/14/2014 0543   CALCIUM 8.3 (L) 10/19/2022 1716   CALCIUM 8.4 (L) 06/14/2014 0543   GFRNONAA 13 (L) 10/19/2022 1716   GFRNONAA 58  (L) 06/14/2014 0543   GFRNONAA >60 12/10/2013 0407   GFRAA 3 (L) 06/15/2020 0841   GFRAA >60 06/14/2014 0543   GFRAA >60 12/10/2013 0407   CrCl cannot be calculated (Patient's most recent lab result is older than the maximum 21 days allowed.).  COAG Lab Results  Component Value Date   INR 1.1 11/03/2019   INR 1.04 05/12/2018   INR 1.07 05/11/2018    Radiology No results found.   Assessment/Plan There are no diagnoses linked to this encounter.   Levora Dredge, MD  01/26/2023 8:45 AM

## 2023-05-04 ENCOUNTER — Other Ambulatory Visit: Payer: Self-pay | Admitting: Family Medicine

## 2023-05-04 DIAGNOSIS — Z1231 Encounter for screening mammogram for malignant neoplasm of breast: Secondary | ICD-10-CM

## 2023-05-23 ENCOUNTER — Ambulatory Visit
Admission: RE | Admit: 2023-05-23 | Discharge: 2023-05-23 | Disposition: A | Discharge status: Home / Self Care | Payer: Medicare Other | Source: Ambulatory Visit | Attending: Family Medicine | Admitting: Family Medicine

## 2023-05-23 ENCOUNTER — Ambulatory Visit: Payer: Medicare Other

## 2023-05-23 DIAGNOSIS — Z1231 Encounter for screening mammogram for malignant neoplasm of breast: Secondary | ICD-10-CM | POA: Insufficient documentation

## 2024-03-04 ENCOUNTER — Emergency Department

## 2024-03-04 ENCOUNTER — Emergency Department
Admission: EM | Admit: 2024-03-04 | Discharge: 2024-03-04 | Disposition: A | Discharge status: Home / Self Care | Source: Home / Self Care | Attending: Emergency Medicine | Admitting: Emergency Medicine

## 2024-03-04 ENCOUNTER — Other Ambulatory Visit: Payer: Self-pay

## 2024-03-04 DIAGNOSIS — N186 End stage renal disease: Secondary | ICD-10-CM | POA: Insufficient documentation

## 2024-03-04 DIAGNOSIS — J9601 Acute respiratory failure with hypoxia: Secondary | ICD-10-CM | POA: Insufficient documentation

## 2024-03-04 DIAGNOSIS — E1122 Type 2 diabetes mellitus with diabetic chronic kidney disease: Secondary | ICD-10-CM | POA: Insufficient documentation

## 2024-03-04 DIAGNOSIS — R55 Syncope and collapse: Secondary | ICD-10-CM | POA: Insufficient documentation

## 2024-03-04 DIAGNOSIS — E875 Hyperkalemia: Secondary | ICD-10-CM | POA: Insufficient documentation

## 2024-03-04 DIAGNOSIS — I120 Hypertensive chronic kidney disease with stage 5 chronic kidney disease or end stage renal disease: Secondary | ICD-10-CM | POA: Insufficient documentation

## 2024-03-04 DIAGNOSIS — G459 Transient cerebral ischemic attack, unspecified: Secondary | ICD-10-CM | POA: Diagnosis not present

## 2024-03-04 DIAGNOSIS — R4182 Altered mental status, unspecified: Secondary | ICD-10-CM | POA: Insufficient documentation

## 2024-03-04 DIAGNOSIS — G3183 Dementia with Lewy bodies: Secondary | ICD-10-CM | POA: Diagnosis not present

## 2024-03-04 DIAGNOSIS — Z992 Dependence on renal dialysis: Secondary | ICD-10-CM | POA: Insufficient documentation

## 2024-03-04 LAB — CBC WITH DIFFERENTIAL/PLATELET
Abs Immature Granulocytes: 0.01 10*3/uL (ref 0.00–0.07)
Basophils Absolute: 0.1 10*3/uL (ref 0.0–0.1)
Basophils Relative: 1 %
Eosinophils Absolute: 0.1 10*3/uL (ref 0.0–0.5)
Eosinophils Relative: 2 %
HCT: 34 % — ABNORMAL LOW (ref 36.0–46.0)
Hemoglobin: 10.8 g/dL — ABNORMAL LOW (ref 12.0–15.0)
Immature Granulocytes: 0 %
Lymphocytes Relative: 9 %
Lymphs Abs: 0.5 10*3/uL — ABNORMAL LOW (ref 0.7–4.0)
MCH: 29.9 pg (ref 26.0–34.0)
MCHC: 31.8 g/dL (ref 30.0–36.0)
MCV: 94.2 fL (ref 80.0–100.0)
Monocytes Absolute: 0.5 10*3/uL (ref 0.1–1.0)
Monocytes Relative: 9 %
Neutro Abs: 4.4 10*3/uL (ref 1.7–7.7)
Neutrophils Relative %: 79 %
Platelets: 129 10*3/uL — ABNORMAL LOW (ref 150–400)
RBC: 3.61 MIL/uL — ABNORMAL LOW (ref 3.87–5.11)
RDW: 15.3 % (ref 11.5–15.5)
WBC: 5.6 10*3/uL (ref 4.0–10.5)
nRBC: 0 % (ref 0.0–0.2)

## 2024-03-04 LAB — COMPREHENSIVE METABOLIC PANEL WITH GFR
ALT: 16 U/L (ref 0–44)
AST: 26 U/L (ref 15–41)
Albumin: 3.7 g/dL (ref 3.5–5.0)
Alkaline Phosphatase: 30 U/L — ABNORMAL LOW (ref 38–126)
Anion gap: 14 (ref 5–15)
BUN: 72 mg/dL — ABNORMAL HIGH (ref 8–23)
CO2: 23 mmol/L (ref 22–32)
Calcium: 8.7 mg/dL — ABNORMAL LOW (ref 8.9–10.3)
Chloride: 100 mmol/L (ref 98–111)
Creatinine, Ser: 9.67 mg/dL — ABNORMAL HIGH (ref 0.44–1.00)
GFR, Estimated: 4 mL/min — ABNORMAL LOW (ref >60)
Glucose, Bld: 80 mg/dL (ref 70–99)
Potassium: 6.2 mmol/L — ABNORMAL HIGH (ref 3.5–5.1)
Sodium: 137 mmol/L (ref 135–145)
Total Bilirubin: 0.9 mg/dL (ref 0.0–1.2)
Total Protein: 7.2 g/dL (ref 6.5–8.1)

## 2024-03-04 LAB — HEPATITIS B SURFACE ANTIGEN: Hepatitis B Surface Ag: NONREACTIVE

## 2024-03-04 LAB — TROPONIN I (HIGH SENSITIVITY): Troponin I (High Sensitivity): 43 ng/L — ABNORMAL HIGH (ref <18)

## 2024-03-04 MED ORDER — CARVEDILOL 25 MG PO TABS
25.0000 mg | ORAL_TABLET | Freq: Once | ORAL | Status: AC
  Administered 2024-03-04: 25 mg via ORAL
  Filled 2024-03-04: qty 1

## 2024-03-04 MED ORDER — LORAZEPAM 1 MG PO TABS
1.0000 mg | ORAL_TABLET | Freq: Once | ORAL | Status: AC
  Administered 2024-03-04: 1 mg via ORAL
  Filled 2024-03-04: qty 1

## 2024-03-04 MED ORDER — AMLODIPINE BESYLATE 5 MG PO TABS
10.0000 mg | ORAL_TABLET | Freq: Once | ORAL | Status: AC
  Administered 2024-03-04: 10 mg via ORAL
  Filled 2024-03-04: qty 2

## 2024-03-04 MED ORDER — CHLORHEXIDINE GLUCONATE CLOTH 2 % EX PADS: 6.0000 | MEDICATED_PAD | Freq: Every day | CUTANEOUS | Status: DC

## 2024-03-04 MED ORDER — ONDANSETRON HCL 4 MG/2ML IJ SOLN
4.0000 mg | Freq: Once | INTRAMUSCULAR | Status: AC
  Administered 2024-03-04: 4 mg via INTRAVENOUS
  Filled 2024-03-04: qty 2

## 2024-03-04 NOTE — ED Notes (Signed)
 Pt. back from dialysis

## 2024-03-04 NOTE — ED Notes (Signed)
 Electronic consent signed for hemodialysis.
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
## 2024-03-04 NOTE — Progress Notes (Addendum)
 Hemodialysis Note:  Received patient in bed to unit. Alert.  Informed consent singed and in chart.  Treatment initiated: 1538 Treatment completed: 1915  Access used: Left AVF Access issues: None  Patient tolerated well. Transported back to room, alert without acute distress. Report given to patient's RN.  Total UF removed: 2.5 Liters Medications given: None  Post HD weight: unable to get weight, bed scale not working  [facility]
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
## 2024-03-04 NOTE — ED Provider Notes (Signed)
 Patient received in signout from Dr. Willo.  After hemodialysis, removing 2.5 L, patient is off of oxygen and feeling much better.  Asymptomatic.  BP improving though still elevated.  She has received oral doses of her antihypertensives.  She is suitable for outpatient management.  I considered admission for this patient.  Clinical Course as of 03/04/24 2105  Mon Mar 04, 2024  1954 Reassessed after dialysis, she reports feeling better.  Took her off supplemental oxygen.  Family apparently on the way [DS]  2104 Reassessed again.  Niece is at the bedside.  Patient reports feeling much better she is off of oxygen.  We discussed PCP follow-up, adherence to dialysis as well as her aunt hypertensive regimen.  Answered questions. [DS]    Clinical Course User Index [DS] Claudene Rover, MD     Claudene Rover, MD 03/04/24 [phone]

## 2024-03-05 ENCOUNTER — Encounter: Payer: Self-pay | Admitting: Pharmacy Technician

## 2024-03-05 ENCOUNTER — Emergency Department

## 2024-03-05 ENCOUNTER — Other Ambulatory Visit: Payer: Self-pay

## 2024-03-05 ENCOUNTER — Inpatient Hospital Stay
Admission: EM | Admit: 2024-03-05 | Discharge: 2024-04-02 | DRG: 056 | Disposition: A | Discharge status: Skilled Nursing Facility | Attending: Internal Medicine | Admitting: Internal Medicine

## 2024-03-05 DIAGNOSIS — I120 Hypertensive chronic kidney disease with stage 5 chronic kidney disease or end stage renal disease: Secondary | ICD-10-CM | POA: Diagnosis present

## 2024-03-05 DIAGNOSIS — I651 Occlusion and stenosis of basilar artery: Secondary | ICD-10-CM | POA: Diagnosis present

## 2024-03-05 DIAGNOSIS — I160 Hypertensive urgency: Secondary | ICD-10-CM | POA: Diagnosis present

## 2024-03-05 DIAGNOSIS — Z8616 Personal history of COVID-19: Secondary | ICD-10-CM

## 2024-03-05 DIAGNOSIS — F02818 Dementia in other diseases classified elsewhere, unspecified severity, with other behavioral disturbance: Secondary | ICD-10-CM | POA: Diagnosis present

## 2024-03-05 DIAGNOSIS — Z882 Allergy status to sulfonamides status: Secondary | ICD-10-CM

## 2024-03-05 DIAGNOSIS — G9389 Other specified disorders of brain: Secondary | ICD-10-CM | POA: Diagnosis present

## 2024-03-05 DIAGNOSIS — Z9842 Cataract extraction status, left eye: Secondary | ICD-10-CM

## 2024-03-05 DIAGNOSIS — I2721 Secondary pulmonary arterial hypertension: Secondary | ICD-10-CM | POA: Diagnosis present

## 2024-03-05 DIAGNOSIS — K30 Functional dyspepsia: Secondary | ICD-10-CM | POA: Diagnosis not present

## 2024-03-05 DIAGNOSIS — E1122 Type 2 diabetes mellitus with diabetic chronic kidney disease: Secondary | ICD-10-CM | POA: Diagnosis present

## 2024-03-05 DIAGNOSIS — E1129 Type 2 diabetes mellitus with other diabetic kidney complication: Secondary | ICD-10-CM | POA: Diagnosis present

## 2024-03-05 DIAGNOSIS — D75829 Heparin-induced thrombocytopenia, unspecified: Secondary | ICD-10-CM | POA: Diagnosis present

## 2024-03-05 DIAGNOSIS — F05 Delirium due to known physiological condition: Secondary | ICD-10-CM | POA: Diagnosis not present

## 2024-03-05 DIAGNOSIS — Z7982 Long term (current) use of aspirin: Secondary | ICD-10-CM

## 2024-03-05 DIAGNOSIS — Z8249 Family history of ischemic heart disease and other diseases of the circulatory system: Secondary | ICD-10-CM

## 2024-03-05 DIAGNOSIS — N2581 Secondary hyperparathyroidism of renal origin: Secondary | ICD-10-CM | POA: Diagnosis present

## 2024-03-05 DIAGNOSIS — E785 Hyperlipidemia, unspecified: Secondary | ICD-10-CM | POA: Diagnosis present

## 2024-03-05 DIAGNOSIS — E871 Hypo-osmolality and hyponatremia: Secondary | ICD-10-CM | POA: Diagnosis not present

## 2024-03-05 DIAGNOSIS — G9341 Metabolic encephalopathy: Secondary | ICD-10-CM | POA: Diagnosis present

## 2024-03-05 DIAGNOSIS — J9601 Acute respiratory failure with hypoxia: Secondary | ICD-10-CM | POA: Diagnosis present

## 2024-03-05 DIAGNOSIS — T45515A Adverse effect of anticoagulants, initial encounter: Secondary | ICD-10-CM | POA: Diagnosis present

## 2024-03-05 DIAGNOSIS — Z888 Allergy status to other drugs, medicaments and biological substances status: Secondary | ICD-10-CM

## 2024-03-05 DIAGNOSIS — Z8673 Personal history of transient ischemic attack (TIA), and cerebral infarction without residual deficits: Secondary | ICD-10-CM

## 2024-03-05 DIAGNOSIS — Z8674 Personal history of sudden cardiac arrest: Secondary | ICD-10-CM

## 2024-03-05 DIAGNOSIS — Z833 Family history of diabetes mellitus: Secondary | ICD-10-CM

## 2024-03-05 DIAGNOSIS — G3183 Dementia with Lewy bodies: Principal | ICD-10-CM | POA: Diagnosis present

## 2024-03-05 DIAGNOSIS — I2489 Other forms of acute ischemic heart disease: Secondary | ICD-10-CM | POA: Diagnosis present

## 2024-03-05 DIAGNOSIS — I161 Hypertensive emergency: Secondary | ICD-10-CM

## 2024-03-05 DIAGNOSIS — E875 Hyperkalemia: Secondary | ICD-10-CM | POA: Diagnosis present

## 2024-03-05 DIAGNOSIS — Z9049 Acquired absence of other specified parts of digestive tract: Secondary | ICD-10-CM

## 2024-03-05 DIAGNOSIS — K5900 Constipation, unspecified: Secondary | ICD-10-CM | POA: Diagnosis not present

## 2024-03-05 DIAGNOSIS — Z91158 Patient's noncompliance with renal dialysis for other reason: Secondary | ICD-10-CM

## 2024-03-05 DIAGNOSIS — N186 End stage renal disease: Principal | ICD-10-CM | POA: Diagnosis present

## 2024-03-05 DIAGNOSIS — D631 Anemia in chronic kidney disease: Secondary | ICD-10-CM | POA: Diagnosis present

## 2024-03-05 DIAGNOSIS — R410 Disorientation, unspecified: Secondary | ICD-10-CM

## 2024-03-05 DIAGNOSIS — I10 Essential (primary) hypertension: Secondary | ICD-10-CM | POA: Diagnosis present

## 2024-03-05 DIAGNOSIS — I6521 Occlusion and stenosis of right carotid artery: Secondary | ICD-10-CM | POA: Diagnosis present

## 2024-03-05 DIAGNOSIS — Z681 Body mass index (BMI) 19 or less, adult: Secondary | ICD-10-CM

## 2024-03-05 DIAGNOSIS — Z9841 Cataract extraction status, right eye: Secondary | ICD-10-CM

## 2024-03-05 DIAGNOSIS — I358 Other nonrheumatic aortic valve disorders: Secondary | ICD-10-CM | POA: Diagnosis present

## 2024-03-05 DIAGNOSIS — E43 Unspecified severe protein-calorie malnutrition: Secondary | ICD-10-CM | POA: Diagnosis present

## 2024-03-05 DIAGNOSIS — W19XXXA Unspecified fall, initial encounter: Secondary | ICD-10-CM | POA: Diagnosis present

## 2024-03-05 DIAGNOSIS — Z751 Person awaiting admission to adequate facility elsewhere: Secondary | ICD-10-CM

## 2024-03-05 DIAGNOSIS — F03918 Unspecified dementia, unspecified severity, with other behavioral disturbance: Secondary | ICD-10-CM | POA: Diagnosis present

## 2024-03-05 DIAGNOSIS — G40209 Localization-related (focal) (partial) symptomatic epilepsy and epileptic syndromes with complex partial seizures, not intractable, without status epilepticus: Secondary | ICD-10-CM

## 2024-03-05 DIAGNOSIS — Z823 Family history of stroke: Secondary | ICD-10-CM

## 2024-03-05 DIAGNOSIS — Z8541 Personal history of malignant neoplasm of cervix uteri: Secondary | ICD-10-CM

## 2024-03-05 DIAGNOSIS — R443 Hallucinations, unspecified: Secondary | ICD-10-CM | POA: Diagnosis present

## 2024-03-05 DIAGNOSIS — G459 Transient cerebral ischemic attack, unspecified: Secondary | ICD-10-CM | POA: Diagnosis not present

## 2024-03-05 DIAGNOSIS — I5A Non-ischemic myocardial injury (non-traumatic): Secondary | ICD-10-CM | POA: Diagnosis not present

## 2024-03-05 DIAGNOSIS — I493 Ventricular premature depolarization: Secondary | ICD-10-CM | POA: Diagnosis present

## 2024-03-05 DIAGNOSIS — N390 Urinary tract infection, site not specified: Secondary | ICD-10-CM | POA: Diagnosis not present

## 2024-03-05 DIAGNOSIS — R9082 White matter disease, unspecified: Secondary | ICD-10-CM | POA: Diagnosis present

## 2024-03-05 DIAGNOSIS — F0284 Dementia in other diseases classified elsewhere, unspecified severity, with anxiety: Secondary | ICD-10-CM | POA: Diagnosis present

## 2024-03-05 DIAGNOSIS — R112 Nausea with vomiting, unspecified: Secondary | ICD-10-CM | POA: Diagnosis not present

## 2024-03-05 DIAGNOSIS — Z79899 Other long term (current) drug therapy: Secondary | ICD-10-CM

## 2024-03-05 DIAGNOSIS — R001 Bradycardia, unspecified: Secondary | ICD-10-CM | POA: Diagnosis not present

## 2024-03-05 DIAGNOSIS — Z992 Dependence on renal dialysis: Secondary | ICD-10-CM

## 2024-03-05 DIAGNOSIS — J811 Chronic pulmonary edema: Secondary | ICD-10-CM | POA: Diagnosis present

## 2024-03-05 DIAGNOSIS — Z89422 Acquired absence of other left toe(s): Secondary | ICD-10-CM

## 2024-03-05 DIAGNOSIS — E1151 Type 2 diabetes mellitus with diabetic peripheral angiopathy without gangrene: Secondary | ICD-10-CM | POA: Diagnosis present

## 2024-03-05 LAB — CBC WITH DIFFERENTIAL/PLATELET
Abs Immature Granulocytes: 0.01 10*3/uL (ref 0.00–0.07)
Basophils Absolute: 0.1 10*3/uL (ref 0.0–0.1)
Basophils Relative: 2 %
Eosinophils Absolute: 0.2 10*3/uL (ref 0.0–0.5)
Eosinophils Relative: 4 %
HCT: 33.9 % — ABNORMAL LOW (ref 36.0–46.0)
Hemoglobin: 10.9 g/dL — ABNORMAL LOW (ref 12.0–15.0)
Immature Granulocytes: 0 %
Lymphocytes Relative: 15 %
Lymphs Abs: 0.7 10*3/uL (ref 0.7–4.0)
MCH: 30.3 pg (ref 26.0–34.0)
MCHC: 32.2 g/dL (ref 30.0–36.0)
MCV: 94.2 fL (ref 80.0–100.0)
Monocytes Absolute: 0.6 10*3/uL (ref 0.1–1.0)
Monocytes Relative: 14 %
Neutro Abs: 2.8 10*3/uL (ref 1.7–7.7)
Neutrophils Relative %: 65 %
Platelets: 150 10*3/uL (ref 150–400)
RBC: 3.6 MIL/uL — ABNORMAL LOW (ref 3.87–5.11)
RDW: 15.1 % (ref 11.5–15.5)
WBC: 4.3 10*3/uL (ref 4.0–10.5)
nRBC: 0 % (ref 0.0–0.2)

## 2024-03-05 LAB — COMPREHENSIVE METABOLIC PANEL WITH GFR
ALT: 14 U/L (ref 0–44)
AST: 23 U/L (ref 15–41)
Albumin: 3.5 g/dL (ref 3.5–5.0)
Alkaline Phosphatase: 29 U/L — ABNORMAL LOW (ref 38–126)
Anion gap: 14 (ref 5–15)
BUN: 47 mg/dL — ABNORMAL HIGH (ref 8–23)
CO2: 26 mmol/L (ref 22–32)
Calcium: 9.2 mg/dL (ref 8.9–10.3)
Chloride: 95 mmol/L — ABNORMAL LOW (ref 98–111)
Creatinine, Ser: 7.33 mg/dL — ABNORMAL HIGH (ref 0.44–1.00)
GFR, Estimated: 6 mL/min — ABNORMAL LOW (ref >60)
Glucose, Bld: 100 mg/dL — ABNORMAL HIGH (ref 70–99)
Potassium: 4.6 mmol/L (ref 3.5–5.1)
Sodium: 135 mmol/L (ref 135–145)
Total Bilirubin: 0.7 mg/dL (ref 0.0–1.2)
Total Protein: 6.9 g/dL (ref 6.5–8.1)

## 2024-03-05 LAB — PROTIME-INR
INR: 1.3 — ABNORMAL HIGH (ref 0.8–1.2)
Prothrombin Time: 16 s — ABNORMAL HIGH (ref 11.4–15.2)

## 2024-03-05 LAB — HEPATITIS B SURFACE ANTIBODY, QUANTITATIVE: Hep B S AB Quant (Post): 2018 m[IU]/mL

## 2024-03-05 LAB — APTT: aPTT: 36 s (ref 24–36)

## 2024-03-05 LAB — ETHANOL: Alcohol, Ethyl (B): <15 mg/dL (ref <15)

## 2024-03-05 MED ORDER — ACETAMINOPHEN 325 MG PO TABS
650.0000 mg | ORAL_TABLET | ORAL | Status: DC | PRN
  Administered 2024-03-08 – 2024-03-31 (×13): 650 mg via ORAL
  Filled 2024-03-05 (×13): qty 2

## 2024-03-05 MED ORDER — STROKE: EARLY STAGES OF RECOVERY BOOK: Freq: Once | Status: AC

## 2024-03-05 MED ORDER — ONDANSETRON HCL 4 MG/2ML IJ SOLN
4.0000 mg | Freq: Three times a day (TID) | INTRAMUSCULAR | Status: DC | PRN
  Administered 2024-03-06 – 2024-03-25 (×7): 4 mg via INTRAVENOUS
  Filled 2024-03-05 (×7): qty 2

## 2024-03-05 MED ORDER — ASPIRIN 325 MG PO TABS
325.0000 mg | ORAL_TABLET | Freq: Every day | ORAL | Status: DC
  Administered 2024-03-06 – 2024-04-02 (×25): 325 mg via ORAL
  Filled 2024-03-05 (×29): qty 1

## 2024-03-05 MED ORDER — SENNOSIDES-DOCUSATE SODIUM 8.6-50 MG PO TABS
1.0000 | ORAL_TABLET | Freq: Every evening | ORAL | Status: DC | PRN
Start: 2024-03-05 — End: 2024-03-13

## 2024-03-05 MED ORDER — ATORVASTATIN CALCIUM 20 MG PO TABS
40.0000 mg | ORAL_TABLET | Freq: Every day | ORAL | Status: DC
  Administered 2024-03-06 – 2024-04-02 (×25): 40 mg via ORAL
  Filled 2024-03-05 (×26): qty 2

## 2024-03-05 MED ORDER — ENALAPRILAT 1.25 MG/ML IV SOLN: 0.6250 mg | INTRAVENOUS | Status: DC | PRN

## 2024-03-05 MED ORDER — ASPIRIN 300 MG RE SUPP
300.0000 mg | Freq: Every day | RECTAL | Status: DC
  Filled 2024-03-05 (×7): qty 1

## 2024-03-05 MED ORDER — ASPIRIN 325 MG PO TBEC
325.0000 mg | DELAYED_RELEASE_TABLET | Freq: Once | ORAL | Status: DC
  Filled 2024-03-05: qty 1

## 2024-03-05 MED ORDER — LORAZEPAM 2 MG/ML IJ SOLN
2.0000 mg | INTRAMUSCULAR | Status: DC | PRN
  Administered 2024-03-16: 2 mg via INTRAVENOUS
  Filled 2024-03-05 (×2): qty 1

## 2024-03-05 MED ORDER — ACETAMINOPHEN 160 MG/5ML PO SOLN: 650.0000 mg | ORAL | Status: DC | PRN

## 2024-03-05 MED ORDER — ACETAMINOPHEN 650 MG RE SUPP: 650.0000 mg | RECTAL | Status: DC | PRN

## 2024-03-05 MED ORDER — ASPIRIN 81 MG PO TBEC: 81.0000 mg | DELAYED_RELEASE_TABLET | Freq: Every day | ORAL | Status: DC

## 2024-03-05 NOTE — H&P (Signed)
 History and Physical    Tracy Jennings FMW:[id number] DOB: 1956-06-02 DOA: 03/05/2024  Referring MD/NP/PA:   PCP: George, Sionne A, MD   Patient coming from:  The patient is coming from home.     Chief Complaint: Slurred speech, left facial spasm, left-sided weakness  HPI: Tracy Jennings is a 68 y.o. 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  Per her sister at the bedside, patient had syncope last night (6/23), was seen in ED, found to have oxygen desaturation to 80% due to pulmonary edema by chest x-ray, hyperkalemia with potassium 6.2 and negative CT of head for acute issues, had hemodialysis, then discharged home.  LKW at 17:00 PM today (6/24). Her sister noted sudden onset of right facial spasm versus and slurred speech at home that lasted for about 2-3 minutes and then resolved. Per her sister, pt seems to have left-sided weakness. Initially pt had confusion which has resolved. When I saw patient in ED, patient is alert, oriented x 3.  Her symptoms have resolved. No slurred speech, facial droop, vision change, hearing loss, no unilateral weakness or numbness in the extremities.  No seizure activity.  Denies chest pain, cough, SOB.  No nausea, vomiting, diarrhea or abdominal pain.  Denies symptoms of UTI.    Data reviewed independently and ED Course: pt was found to have WBC 4.3, troponin 43, INR 1.3, PTT 36, potassium of 4.6, bicarbonate of 26, creatinine 7.33, BUN 47, temperature normal, blood pressure 252/79--> 138/69, heart rate 87, RR 24, oxygen saturation 97% on room air. Patient is placed in telemetry bed for observation. Code stroke was called, Dr. Merrianne of neurology evaluated patient.  CT of head: 1. No CT evidence of acute intracranial abnormality. 2.  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 Extensive chronic microvascular ischemic changes with remote lacunar infarcts in the right caudate and right thalamus. 3. ASPECTS is 10    EKG: I have personally reviewed.  Sinus rhythm, QTc 489, LAE, poor R wave progression, LVH.   Review of Systems:   General: no fevers, chills, no body weight gain, fatigue HEENT: no blurry vision, hearing changes or sore throat Respiratory: no dyspnea, coughing, wheezing CV: no chest pain, no palpitations GI: no nausea, vomiting, abdominal pain, diarrhea, constipation GU: no dysuria, burning on urination, increased urinary frequency, hematuria  Ext: no leg edema Neuro: has slurred speech, left facial spasm, left-sided weakness Skin: no rash, no skin tear. MSK: No muscle spasm, no deformity, no limitation of range of movement in spin Heme: No easy bruising.  Travel history: No recent long distant travel.   Allergy:  Allergies  Allergen Reactions   Amlodipine  Swelling    Knees down to ankles Knees down to ankles   Hctz [Hydrochlorothiazide]     pancreatitis   Heparin  Other (See Comments)    Pt reports cardiac arrest when given heparin    Lmw Heparin      Reports cardiac arrest when given heparin    Other Other (See Comments)    Reports cardiac arrest when given during surgery Reports cardiac arrest when given during surgery Reports cardiac arrest when given during surgery    Sulfa Antibiotics Rash   Lactose    Dairy Aid [Tilactase]     Runny nose   Hydralazine      Nausea and rash    Past  Medical History:  Diagnosis Date   Anemia 04/2018   low iron. to be started on supplements   Cervical cancer (HCC)    CKD (chronic kidney disease)    Stage IV   Complication of anesthesia    receceived too much anesthesia, that she was in coma for a couple days    COVID-19 virus detected 10/28/2019   Diabetes mellitus without complication (HCC)    type II   ESRD (end stage renal disease)  (HCC)    Heart murmur    followed as a child only   HSIL (high grade squamous intraepithelial lesion) on Pap smear of cervix    Hyperlipidemia associated with type 2 diabetes mellitus (HCC)    Hypertension    Pancreatitis    Peripheral vascular disease (HCC)     Past Surgical History:  Procedure Laterality Date   AMPUTATION TOE Left 2013   2nd toe. tip of toe (toe nail was infected)   AV FISTULA PLACEMENT Left 05/11/2018   Procedure: ARTERIOVENOUS (AV) FISTULA CREATION;  Surgeon: Jama Cordella MATSU, MD;  Location: [facility];  Service: Vascular;  Laterality: Left;   CATARACT EXTRACTION     CERVICAL CONIZATION W/BX N/A 04/08/2020   Procedure: CONIZATION CERVIX WITH BIOPSY;  Surgeon: Mancil Barter, MD;  Location: [facility];  Service: Gynecology;  Laterality: N/A;   CHOLECYSTECTOMY  2014   COLONOSCOPY     COLONOSCOPY WITH PROPOFOL  N/A 01/10/2018   Procedure: COLONOSCOPY WITH PROPOFOL ;  Surgeon: Toledo, Ladell POUR, MD;  Location: [facility];  Service: Gastroenterology;  Laterality: N/A;   DIALYSIS/PERMA CATHETER INSERTION N/A 05/21/2018   Procedure: DIALYSIS/PERMA CATHETER INSERTION;  Surgeon: Marea Selinda RAMAN, MD;  Location: [facility];  Service: Cardiovascular;  Laterality: N/A;   DIALYSIS/PERMA CATHETER INSERTION N/A 07/14/2020   Procedure: DIALYSIS/PERMA CATHETER INSERTION;  Surgeon: Marea Selinda RAMAN, MD;  Location: [facility];  Service: Cardiovascular;  Laterality: N/A;   DIALYSIS/PERMA CATHETER REMOVAL N/A 04/11/2019   Procedure: DIALYSIS/PERMA CATHETER REMOVAL;  Surgeon: Marea Selinda RAMAN, MD;  Location: [facility];  Service: Cardiovascular;  Laterality: N/A;   EYE SURGERY Bilateral 2018   cataract extractions   IR IMAGING GUIDED PORT INSERTION  06/05/2020   THROMBECTOMY W/ EMBOLECTOMY  05/11/2018   Procedure: THROMBECTOMY ARTERIOVENOUS FISTULA;  Surgeon: Jama Cordella MATSU, MD;  Location: [facility];  Service: Vascular;;   TUBAL LIGATION  1984    Social History:   reports that she has never smoked. She has never used smokeless tobacco. She reports that she does not drink alcohol and does not use drugs.  Family History:  Family History  Problem Relation Age of Onset   Stroke Mother    Hypertension Mother    Gout Mother    Cancer Father    Diabetes Sister    Diabetes Maternal Grandmother    Diabetes Maternal Grandfather    Diabetes Paternal Grandmother    Diabetes Paternal Grandfather      Prior to Admission medications   Medication Sig Start Date End Date Taking? Authorizing Provider  amLODipine  (NORVASC ) 10 MG tablet Take 10 mg by mouth daily. 12/01/20   [provider]  calcium  acetate (PHOSLO ) 667 MG capsule Take 1 capsule (667 mg total) by mouth 3 (three) times daily with meals. 04/01/22   Krishnan, Sendil K, MD  carvedilol  (COREG ) 25 MG tablet Take 1 tablet (25 mg total) by mouth 2 (two) times daily with a meal. 04/01/22   Krishnan, Sendil K, MD  cinacalcet (  SENSIPAR) 30 MG tablet Take 30 mg by mouth daily. 12/15/21   [provider]  cyclobenzaprine  (FLEXERIL ) 10 MG tablet Take 1 tablet (10 mg total) by mouth 3 (three) times daily as needed for muscle spasms. 09/14/22   Bradler, Evan K, MD  diazepam  (VALIUM ) 5 MG tablet Take 1 tablet (5 mg) 30-60 minutes prior to PET. If needed take second tablet (5 mg) 15 minutes prior to PET. 09/26/22   Dasie Tinnie MATSU, NP  doxazosin  (CARDURA ) 4 MG tablet Take 4 mg by mouth daily. 07/01/19   [provider]  isosorbide  mononitrate (IMDUR ) 60 MG 24 hr tablet Take 1 tablet (60 mg total) by mouth daily. 04/02/22   Krishnan, Sendil K, MD  isosorbide  mononitrate (IMDUR ) 60 MG 24 hr tablet Take 0.5 tablets (30 mg total) by mouth daily. 10/19/22 11/18/22  Clide Burnard Ee, MD  loperamide (IMODIUM) 2 MG capsule Take 2 mg by mouth as needed for diarrhea or loose stools.    [provider]  losartan  (COZAAR ) 100 MG tablet SMARTSIG:1 Tablet(s) By Mouth Every Evening 02/03/22   [provider]  oxyCODONE  (ROXICODONE ) 5 MG immediate release tablet Take 1-2 tablets (5-10 mg total) by mouth every 6 (six) hours as needed for severe pain. 09/19/22   Dasie Tinnie MATSU, NP  diltiazem  (CARDIZEM  CD) 120 MG 24 hr capsule Take 120 mg by mouth daily. 09/26/19 10/29/19  [provider]    Physical Exam: Vitals:   03/05/24 1824 03/05/24 1900 03/05/24 2351  BP: (!) 170/76 138/69 (!) 183/83  Pulse: 75 72 76  Resp: (!) 24 10 11   Temp: 98.8 F (37.1 C)  98.9 F (37.2 C)  TempSrc:   Oral  SpO2: 97% 96% 97%   General: Not in acute distress HEENT:       Eyes: PERRL, EOMI, no jaundice       ENT: No discharge from the ears and nose, no pharynx injection, no tonsillar enlargement.        Neck: No JVD, no bruit, no mass felt. Heme: No neck lymph node enlargement. Cardiac: S1/S2, RRR, No murmurs, No gallops or rubs. Respiratory: No rales, wheezing, rhonchi or rubs. GI: Soft, nondistended, nontender, no rebound pain, no organomegaly, BS present. GU: No hematuria Ext: No pitting leg edema bilaterally. 1+DP/PT pulse bilaterally. Musculoskeletal: No joint deformities, No joint redness or warmth, no limitation of ROM in spin. Skin: No rashes.  Neuro: Alert, oriented X3, cranial nerves II-XII grossly intact, moves all extremities normally. Muscle strength 5/5 in all extremities, sensation to light touch intact. Brachial reflex 2+ bilaterally. Knee reflex 1+ bilaterally. Negative Babinski's sign. Normal finger to nose test. Psych: Patient is not psychotic, no suicidal or hemocidal ideation.  Labs on Admission: I have personally reviewed following labs and imaging studies  CBC: Recent Labs  Lab 03/04/24 1315 03/05/24 1810  WBC 5.6 4.3  NEUTROABS 4.4 2.8  HGB 10.8* 10.9*  HCT 34.0* 33.9*  MCV 94.2 94.2  PLT 129* 150   Basic Metabolic Panel: Recent Labs  Lab 03/04/24 1239 03/05/24 1810  NA 137 135  K 6.2* 4.6  CL 100 95*  CO2 23 26  GLUCOSE 80 100*  BUN 72* 47*   CREATININE 9.67* 7.33*  CALCIUM  8.7* 9.2   GFR: Estimated Creatinine Clearance: 6.8 mL/min (A) (by C-G formula based on SCr of 7.33 mg/dL (H)). Liver Function Tests: Recent Labs  Lab 03/04/24 1239 03/05/24 1810  AST 26 23  ALT 16 14  ALKPHOS 30*  29*  BILITOT 0.9 0.7  PROT 7.2 6.9  ALBUMIN 3.7 3.5   No results for input(s): LIPASE, AMYLASE in the last 168 hours. No results for input(s): AMMONIA in the last 168 hours. Coagulation Profile: Recent Labs  Lab 03/05/24 1810  INR 1.3*   Cardiac Enzymes: No results for input(s): CKTOTAL, CKMB, CKMBINDEX, TROPONINI in the last 168 hours. BNP (last 3 results) No results for input(s): PROBNP in the last 8760 hours. HbA1C: No results for input(s): HGBA1C in the last 72 hours. CBG: No results for input(s): GLUCAP in the last 168 hours. Lipid Profile: No results for input(s): CHOL, HDL, LDLCALC, TRIG, CHOLHDL, LDLDIRECT in the last 72 hours. Thyroid Function Tests: No results for input(s): TSH, T4TOTAL, FREET4, T3FREE, THYROIDAB in the last 72 hours. Anemia Panel: No results for input(s): VITAMINB12, FOLATE, FERRITIN, TIBC, IRON, RETICCTPCT in the last 72 hours. Urine analysis:    Component Value Date/Time   COLORURINE YELLOW (A) 10/31/2017 1959   APPEARANCEUR HAZY (A) 10/31/2017 1959   APPEARANCEUR Hazy 12/09/2013 1046   LABSPEC 1.017 10/31/2017 1959   LABSPEC 1.031 12/09/2013 1046   PHURINE 6.0 10/31/2017 1959   GLUCOSEU >=500 (A) 10/31/2017 1959   GLUCOSEU >=500 12/09/2013 1046   HGBUR NEGATIVE 10/31/2017 1959   BILIRUBINUR NEGATIVE 10/31/2017 1959   BILIRUBINUR Negative 12/09/2013 1046   KETONESUR NEGATIVE 10/31/2017 1959   PROTEINUR >=300 (A) 10/31/2017 1959   NITRITE NEGATIVE 10/31/2017 1959   LEUKOCYTESUR NEGATIVE 10/31/2017 1959   LEUKOCYTESUR Negative 12/09/2013 1046   Sepsis Labs: @LABRCNTIP (procalcitonin:4,lacticidven:4) )No results found for this or any  previous visit (from the past 240 hours).   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 complex partial seizure.  Patient had dialysis last night in ED (6/23). Potassium 4.6, bicarbonate 26, creatinine 7.73, BUN 47.  No urgent need for dialysis now.  TIA (transient ischemic attack): Symptoms have resolved.  Dr. Merrianne of neurology evaluated patient.  Per Dr. Lindzen, differential diagnosis include TIA versus a partial complex seizure.  Patient did not pass swallowing screen, stroke is also possible differential diagnosis.   - Placed on tele bed for observation - Keep NPO and get SLP - ASA per rectum now - Obtain MRI-brain  - Check carotid dopplers and MRA of head without contrast - will hold oral Bp meds to allow permissive HTN - Statin: start lipitor 40 mg daily - fasting lipid panel and HbA1c  - 2D transthoracic echocardiography  - Check UDS  - Seizure precaution - As needed Ativan  for seizure - EEG - PT/OT consult  ESRD (end stage renal disease) (MWF): potassium of 4.6, bicarbonate of 26, creatinine 7.33, BUN 47. Pt just had HD last night (6/23). -consulted Dr. Marcelino of renal for HD.  Essential hypertension -- prn IV enalaprilat  for SBP>220 or dBP>110 -Hold all blood pressure medications: Amlodipine , Coreg , Cardura , Cozaar , Imdur   Myocardial injury: Troponin 43, no chest pain, likely due to demand ischemia versus decreased  clearance in the setting of ESRD. -Patient  on aspirin  and Lipitor as above - Trend troponin  Diet controlled type II diabetes mellitus with renal manifestations [facility]): Recent A1c 6.3, well-controlled.  Patient is not taking medications currently.  Blood sugar 100 today. 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      Disposition Plan:  Anticipate discharge back to previous environment  Consults called: Dr. Merrianne of neurology  Admission status and Level of care: Telemetry Medical:    for obs    Dispo: The patient is from: Home              Anticipated d/c is to: Home              Anticipated d/c date is: 1 day              Patient currently is not medically stable to d/c.    Severity of Illness:  The appropriate patient status for this patient is OBSERVATION. Observation status is judged to be reasonable and necessary in order to provide the required intensity of service to ensure the patient's safety. The patient's presenting symptoms, physical exam findings, and initial radiographic and laboratory data in the context of their medical condition is felt to place them at decreased risk for further clinical deterioration. Furthermore, it is anticipated that the patient will be medically stable for discharge from the hospital within 2 midnights of admission.        Date of Service 03/06/2024    Caleb Exon [facility]   If 7PM-7AM, please contact night-coverage [email or web address] 03/06/2024, 12:15 AM

## 2024-03-05 NOTE — Progress Notes (Signed)
 CODE STROKE- PHARMACY COMMUNICATION   Time CODE STROKE called/page received: 1724  Time response to CODE STROKE was made (in person or via phone): Immediately  Time Stroke Kit retrieved from Pyxis (only if needed): N/A, too mild to treat (risk > benefit)  Name of Provider/Nurse contacted: Dr. Merrianne Marolyn KATHEE Clair 03/05/2024  6:10 PM

## 2024-03-05 NOTE — ED Notes (Signed)
 This nurse assumed care of pt at this time. Family member remains at bedside. Warm blanket provided per request. Call light within reach. Side rails up x 2. Bed in lowest position with wheels locked.

## 2024-03-05 NOTE — ED Triage Notes (Signed)
 Pt bib ems from home with ams. L weakness and slurred speech. Pt activated as a code stroke en route.

## 2024-03-05 NOTE — Progress Notes (Signed)
 Code stroke cart activated at 1730- EMS pre alert. Teleneurology paged at 1732. Pt arrived with EMS at 1742-Dr. Lindzen at bedside.  Pt to CT at 1751.  Hunter Galt,, Tele Stroke RN

## 2024-03-05 NOTE — ED Provider Notes (Signed)
 [facility] Provider Note    Event Date/Time   First MD Initiated Contact with Patient 03/05/24 1751     (approximate)   History   Code Stroke  Pt bib ems from home with ams. L weakness and slurred speech. Pt activated as a code stroke en route.    HPI Tracy Jennings is a 68 y.o. female PMH ESRD on dialysis (MWF), hyperlipidemia, diabetes, dementia, chronic pancreatitis presents for evaluation of altered mental status, possible left-sided weakness and slurring of speech - Code stroke activated prior to arrival - Patient is a limited historian, does not respond to many questions appropriately though unclear baseline - Symptoms reportedly started around 5 PM    Per chart review, patient was seen in our emergency department yesterday after a reported loss of consciousness episode.  Found to have hyperkalemia to 6.2 and hypoxic to the mid 80s on room air, improved with 2 L nasal cannula here.  Chest x-ray with pulmonary edema.  Nephrology consulted, plan for emergent dialysis.  Dialysis performed, felt to be stable for discharge home from nephrology perspective, discharge.      Physical Exam   Triage Vital Signs: BP 138/69   Pulse 72   Temp 98.8 F (37.1 C)   Resp 10   LMP  (LMP Unknown)   SpO2 96%     Most recent vital signs: Vitals:   03/05/24 1824 03/05/24 1900  BP: (!) 170/76 138/69  Pulse: 75 72  Resp: (!) 24 10  Temp: 98.8 F (37.1 C)   SpO2: 97% 96%     General: Awake, no distress.  CV:  Good peripheral perfusion. RRR, RP 2+ Resp:  Normal effort. CTAB Abd:  No distention. Nontender to deep palpation throughout Neuro:  Alert, unclear orientation, face symmetric, moving all extremities spontaneously, limited interaction with neurologic exam.  No obvious aphasia or dysarthria.     ED Results / Procedures / Treatments   Labs (all labs ordered are listed, but only abnormal results are displayed) Labs Reviewed  PROTIME-INR -  Abnormal; Notable for the following components:      Result Value   Prothrombin Time 16.0 (*)    INR 1.3 (*)    All other components within normal limits  COMPREHENSIVE METABOLIC PANEL WITH GFR - Abnormal; Notable for the following components:   Chloride 95 (*)    Glucose, Bld 100 (*)    BUN 47 (*)    Creatinine, Ser 7.33 (*)    Alkaline Phosphatase 29 (*)    GFR, Estimated 6 (*)    All other components within normal limits  CBC WITH DIFFERENTIAL/PLATELET - Abnormal; Notable for the following components:   RBC 3.60 (*)    Hemoglobin 10.9 (*)    HCT 33.9 (*)    All other components within normal limits  ETHANOL  APTT  CBC  DIFFERENTIAL  URINE DRUG SCREEN, QUALITATIVE ([facility] ONLY)  URINALYSIS, ROUTINE W REFLEX MICROSCOPIC     EKG  Ecg = sinus rhythm, rate 74, no gross ST elevation or depression with apparent early repolarization in V2-V4.  Left axis deviation.  Normal intervals.  No clear evidence of ischemia or arrhythmia on my interpretation.   RADIOLOGY Radiology interpreted by myself and radiology report reviewed.  No intracranial hemorrhage.  Radiology notes chronic infarct of right parietal lobe and remote infarcts.    PROCEDURES:  Critical Care performed: {CriticalCareYesNo:19197::Yes, see critical care procedure note(s),No}  Procedures   MEDICATIONS ORDERED IN ED: Medications -  No data to display   IMPRESSION / MDM / ASSESSMENT AND PLAN / ED COURSE  I reviewed the triage vital signs and the nursing notes.                              DDX/MDM/AP: Differential diagnosis includes, but is not limited to, possible CVA/TIA, consider metabolic abnormality, doubt underlying infection based on initial eval.  Consider substance use.  Plan: - Code stroke activated prior to arrival - Emergent neurology consult - CT head - Labs - EKG -cardiac monitor  Patient's presentation is most consistent with acute presentation with potential threat to life or bodily  function.  The patient is on the cardiac monitor to evaluate for evidence of arrhythmia and/or significant heart rate changes.  ED course below. ***  Clinical Course as of 03/05/24 1914  Tue Mar 05, 2024  1826 [facility]: IMPRESSION: 1. No CT evidence of acute intracranial abnormality. 2. 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 Extensive chronic microvascular ischemic changes with remote lacunar infarcts in the right caudate and right thalamus. 3. ASPECTS is 10   [MM]  1912 CMP reviewed, unremarkable  CBC unremarkable [MM]    Clinical Course User Index [MM] Clarine Ozell LABOR, MD     FINAL CLINICAL IMPRESSION(S) / ED DIAGNOSES   Final diagnoses:  None     Rx / DC Orders   ED Discharge Orders     None        Note:  This document was prepared using Dragon voice recognition software and may include unintentional dictation errors.

## 2024-03-05 NOTE — H&P (Incomplete)
 History and Physical    Tracy Jennings FMW:[id number] DOB: [birth year] DOA: 03/05/2024  Referring MD/NP/PA:   PCP: George, Sionne A, MD   Patient coming from:  The patient is coming from home.     Chief Complaint: Slurred speech, left facial spasm, left-sided weakness  HPI: Tracy Jennings is a 68 y.o. 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  Per her sister at the bedside, patient had syncope last night (6/23), was seen in ED, found to have oxygen desaturation to 80% due to pulmonary edema by chest x-ray, hyperkalemia with potassium 6.2 and negative CT of head for acute issues, had hemodialysis, then discharged home.  LKW at 17:00 PM today (6/24). Her sister noted sudden onset of right facial spasm versus and slurred speech at home that lasted for about 2-3 minutes and then resolved. Per her sister, pt seems to have left-sided weakness. Initially pt had confusion which has resolved. When I saw patient in ED, patient is alert, oriented x 3.  No slurred speech, facial droop, vision change, hearing loss, no unilateral weakness or numbness in the extremities.  No seizure activity.  Denies chest pain, cough, SOB.  No nausea, vomiting, diarrhea or abdominal pain.  Denies symptoms of UTI.       Data reviewed independently and ED Course: pt was found to have WBC 4.3, troponin 43, INR 1.3, PTT 36, potassium of 4.6, bicarbonate of 26, creatinine 7.33, BUN 47, temperature normal, blood pressure 252/79--> 138/69, heart rate 87, RR 24, oxygen saturation 97% on room air.  Chest x-ray showed vascular congestion and a moderate left pleural effusion.  Patient is placed in telemetry bed for observation. Code stroke was called, Dr. Merrianne of neurology evaluated patient.  CT of  head: 1. No CT evidence of acute intracranial abnormality. 2. 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 Extensive chronic microvascular ischemic changes with remote lacunar infarcts in the right caudate and right thalamus. 3. ASPECTS is 10      EKG: I have personally reviewed.  Not done in ED, will get one.   ***   Review of Systems:   General: no fevers, chills, no body weight gain, has poor appetite, has fatigue HEENT: no blurry vision, hearing changes or sore throat Respiratory: no dyspnea, coughing, wheezing CV: no chest pain, no palpitations GI: no nausea, vomiting, abdominal pain, diarrhea, constipation GU: no dysuria, burning on urination, increased urinary frequency, hematuria  Ext: no leg edema Neuro: no unilateral weakness, numbness, or tingling, no vision change or hearing loss Skin: no rash, no skin tear. MSK: No muscle spasm, no deformity, no limitation of range of movement in spin Heme: No easy bruising.  Travel history: No recent long distant travel.   Allergy:  Allergies  Allergen Reactions  . Amlodipine  Swelling    Knees down to ankles Knees down to ankles  . Hctz [Hydrochlorothiazide]     pancreatitis  . Heparin  Other (See Comments)    Pt reports cardiac arrest when given heparin   . Lmw Heparin      Reports cardiac arrest when given heparin   . Other Other (See Comments)    Reports cardiac arrest when given during surgery Reports cardiac arrest when given during surgery Reports cardiac arrest when given during surgery   . Sulfa Antibiotics Rash  . Lactose   . Dairy  Aid [Tilactase]     Runny nose  . Hydralazine      Nausea and rash    Past Medical History:  Diagnosis Date  . Anemia 04/2018   low iron. to be started on supplements  . Cervical cancer (HCC)   . CKD (chronic kidney disease)    Stage IV  . Complication of anesthesia    receceived too much anesthesia, that she was in coma for a couple days   . COVID-19 virus detected  10/28/2019  . Diabetes mellitus without complication (HCC)    type II  . ESRD (end stage renal disease) (HCC)   . Heart murmur    followed as a child only  . HSIL (high grade squamous intraepithelial lesion) on Pap smear of cervix   . Hyperlipidemia associated with type 2 diabetes mellitus (HCC)   . Hypertension   . Pancreatitis   . Peripheral vascular disease [facility])     Past Surgical History:  Procedure Laterality Date  . AMPUTATION TOE Left 2013   2nd toe. tip of toe (toe nail was infected)  . AV FISTULA PLACEMENT Left 05/11/2018   Procedure: ARTERIOVENOUS (AV) FISTULA CREATION;  Surgeon: Jama Cordella MATSU, MD;  Location: [facility] ORS;  Service: Vascular;  Laterality: Left;  . CATARACT EXTRACTION    . CERVICAL CONIZATION W/BX N/A 04/08/2020   Procedure: CONIZATION CERVIX WITH BIOPSY;  Surgeon: Mancil Barter, MD;  Location: [facility] ORS;  Service: Gynecology;  Laterality: N/A;  . CHOLECYSTECTOMY  2014  . COLONOSCOPY    . COLONOSCOPY WITH PROPOFOL  N/A 01/10/2018   Procedure: COLONOSCOPY WITH PROPOFOL ;  Surgeon: Toledo, Ladell POUR, MD;  Location: [facility] ENDOSCOPY;  Service: Gastroenterology;  Laterality: N/A;  . DIALYSIS/PERMA CATHETER INSERTION N/A 05/21/2018   Procedure: DIALYSIS/PERMA CATHETER INSERTION;  Surgeon: Marea Selinda RAMAN, MD;  Location: [facility] INVASIVE CV LAB;  Service: Cardiovascular;  Laterality: N/A;  . DIALYSIS/PERMA CATHETER INSERTION N/A 07/14/2020   Procedure: DIALYSIS/PERMA CATHETER INSERTION;  Surgeon: Marea Selinda RAMAN, MD;  Location: [facility] INVASIVE CV LAB;  Service: Cardiovascular;  Laterality: N/A;  . DIALYSIS/PERMA CATHETER REMOVAL N/A 04/11/2019   Procedure: DIALYSIS/PERMA CATHETER REMOVAL;  Surgeon: Marea Selinda RAMAN, MD;  Location: [facility] INVASIVE CV LAB;  Service: Cardiovascular;  Laterality: N/A;  . EYE SURGERY Bilateral 2018   cataract extractions  . IR IMAGING GUIDED PORT INSERTION  06/05/2020  . THROMBECTOMY W/ EMBOLECTOMY  05/11/2018   Procedure: THROMBECTOMY ARTERIOVENOUS FISTULA;   Surgeon: Jama Cordella MATSU, MD;  Location: [facility] ORS;  Service: Vascular;;  . TUBAL LIGATION  1984    Social History:  reports that she has never smoked. She has never used smokeless tobacco. She reports that she does not drink alcohol and does not use drugs.  Family History:  Family History  Problem Relation Age of Onset  . Stroke Mother   . Hypertension Mother   . Gout Mother   . Cancer Father   . Diabetes Sister   . Diabetes Maternal Grandmother   . Diabetes Maternal Grandfather   . Diabetes Paternal Grandmother   . Diabetes Paternal Grandfather      Prior to Admission medications   Medication Sig Start Date End Date Taking? Authorizing Provider  amLODipine  (NORVASC ) 10 MG tablet Take 10 mg by mouth daily. 12/01/20   [provider]  calcium  acetate (PHOSLO ) 667 MG capsule Take 1 capsule (667 mg total) by mouth 3 (three) times daily with meals. 04/01/22   Krishnan, Sendil K, MD  carvedilol  (COREG ) 25 MG tablet Take 1  tablet (25 mg total) by mouth 2 (two) times daily with a meal. 04/01/22   Krishnan, Sendil K, MD  cinacalcet (SENSIPAR) 30 MG tablet Take 30 mg by mouth daily. 12/15/21   [provider]  cyclobenzaprine  (FLEXERIL ) 10 MG tablet Take 1 tablet (10 mg total) by mouth 3 (three) times daily as needed for muscle spasms. 09/14/22   Bradler, Evan K, MD  diazepam  (VALIUM ) 5 MG tablet Take 1 tablet (5 mg) 30-60 minutes prior to PET. If needed take second tablet (5 mg) 15 minutes prior to PET. 09/26/22   Dasie Tinnie MATSU, NP  doxazosin  (CARDURA ) 4 MG tablet Take 4 mg by mouth daily. 07/01/19   [provider]  isosorbide  mononitrate (IMDUR ) 60 MG 24 hr tablet Take 1 tablet (60 mg total) by mouth daily. 04/02/22   Krishnan, Sendil K, MD  isosorbide  mononitrate (IMDUR ) 60 MG 24 hr tablet Take 0.5 tablets (30 mg total) by mouth daily. 10/19/22 11/18/22  Clide Burnard Ee, MD  loperamide (IMODIUM) 2 MG capsule Take 2 mg by mouth as needed for diarrhea or loose stools.     [provider]  losartan  (COZAAR ) 100 MG tablet SMARTSIG:1 Tablet(s) By Mouth Every Evening 02/03/22   [provider]  oxyCODONE  (ROXICODONE ) 5 MG immediate release tablet Take 1-2 tablets (5-10 mg total) by mouth every 6 (six) hours as needed for severe pain. 09/19/22   Dasie Tinnie MATSU, NP  diltiazem  (CARDIZEM  CD) 120 MG 24 hr capsule Take 120 mg by mouth daily. 09/26/19 10/29/19  [provider]    Physical Exam: Vitals:   03/05/24 1824 03/05/24 1900  BP: (!) 170/76 138/69  Pulse: 75 72  Resp: (!) 24 10  Temp: 98.8 F (37.1 C)   SpO2: 97% 96%   General: Not in acute distress HEENT:       Eyes: PERRL, EOMI, no jaundice       ENT: No discharge from the ears and nose, no pharynx injection, no tonsillar enlargement.        Neck: No JVD, no bruit, no mass felt. Heme: No neck lymph node enlargement. Cardiac: S1/S2, RRR, No murmurs, No gallops or rubs. Respiratory: No rales, wheezing, rhonchi or rubs. GI: Soft, nondistended, nontender, no rebound pain, no organomegaly, BS present. GU: No hematuria Ext: No pitting leg edema bilaterally. 1+DP/PT pulse bilaterally. Musculoskeletal: No joint deformities, No joint redness or warmth, no limitation of ROM in spin. Skin: No rashes.  Neuro: Alert, oriented X3, cranial nerves II-XII grossly intact, moves all extremities normally. Muscle strength 5/5 in all extremities, sensation to light touch intact. Brachial reflex 2+ bilaterally. Knee reflex 1+ bilaterally. Negative Babinski's sign. Normal finger to nose test. Psych: Patient is not psychotic, no suicidal or hemocidal ideation.  Labs on Admission: I have personally reviewed following labs and imaging studies  CBC: Recent Labs  Lab 03/04/24 1315 03/05/24 1810  WBC 5.6 4.3  NEUTROABS 4.4 2.8  HGB 10.8* 10.9*  HCT 34.0* 33.9*  MCV 94.2 94.2  PLT 129* 150   Basic Metabolic Panel: Recent Labs  Lab 03/04/24 1239 03/05/24 1810  NA 137 135  K 6.2* 4.6  CL 100  95*  CO2 23 26  GLUCOSE 80 100*  BUN 72* 47*  CREATININE 9.67* 7.33*  CALCIUM  8.7* 9.2   GFR: Estimated Creatinine Clearance: 6.8 mL/min (A) (by C-G formula based on SCr of 7.33 mg/dL (H)). Liver Function Tests: Recent Labs  Lab 03/04/24 1239 03/05/24 1810  AST 26 23  ALT 16 14  ALKPHOS 30* 29*  BILITOT 0.9 0.7  PROT 7.2 6.9  ALBUMIN 3.7 3.5   No results for input(s): LIPASE, AMYLASE in the last 168 hours. No results for input(s): AMMONIA in the last 168 hours. Coagulation Profile: Recent Labs  Lab 03/05/24 1810  INR 1.3*   Cardiac Enzymes: No results for input(s): CKTOTAL, CKMB, CKMBINDEX, TROPONINI in the last 168 hours. BNP (last 3 results) No results for input(s): PROBNP in the last 8760 hours. HbA1C: No results for input(s): HGBA1C in the last 72 hours. CBG: No results for input(s): GLUCAP in the last 168 hours. Lipid Profile: No results for input(s): CHOL, HDL, LDLCALC, TRIG, CHOLHDL, LDLDIRECT in the last 72 hours. Thyroid Function Tests: No results for input(s): TSH, T4TOTAL, FREET4, T3FREE, THYROIDAB in the last 72 hours. Anemia Panel: No results for input(s): VITAMINB12, FOLATE, FERRITIN, TIBC, IRON, RETICCTPCT in the last 72 hours. Urine analysis:    Component Value Date/Time   COLORURINE YELLOW (A) 10/31/2017 1959   APPEARANCEUR HAZY (A) 10/31/2017 1959   APPEARANCEUR Hazy 12/09/2013 1046   LABSPEC 1.017 10/31/2017 1959   LABSPEC 1.031 12/09/2013 1046   PHURINE 6.0 10/31/2017 1959   GLUCOSEU >=500 (A) 10/31/2017 1959   GLUCOSEU >=500 12/09/2013 1046   HGBUR NEGATIVE 10/31/2017 1959   BILIRUBINUR NEGATIVE 10/31/2017 1959   BILIRUBINUR Negative 12/09/2013 1046   KETONESUR NEGATIVE 10/31/2017 1959   PROTEINUR >=300 (A) 10/31/2017 1959   NITRITE NEGATIVE 10/31/2017 1959   LEUKOCYTESUR NEGATIVE 10/31/2017 1959   LEUKOCYTESUR Negative 12/09/2013 1046   Sepsis  Labs: @LABRCNTIP (procalcitonin:4,lacticidven:4) )No results found for this or any previous visit (from the past 240 hours).   Radiological Exams on Admission:   Assessment/Plan Active Problems:   * No active hospital problems. *   Assessment and Plan: No notes have been filed under this hospital service. Service: Hospitalist      Active Problems:   * No active hospital problems. *    DVT ppx: SQ Heparin          SQ Lovenox   Code Status: Full code   ***  Family Communication:     not done, no family member is at bed side.              Yes, patient's    at bed side.       by phone   ***  Disposition Plan:  Anticipate discharge back to previous environment  Consults called:    Admission status and Level of care: :    for obs as inpt        Dispo: The patient is from: {From:23814}              Anticipated d/c is to: {To:23815}              Anticipated d/c date is: {Days:23816}              Patient currently {Medically stable:23817}    Severity of Illness:  {Observation/Inpatient:21159}       Date of Service 03/05/2024    Caleb Exon [facility]   If 7PM-7AM, please contact night-coverage [email or web address] 03/05/2024, 11:14 PM

## 2024-03-05 NOTE — Consult Note (Signed)
 NEUROLOGY CONSULT NOTE   Date of service: March 05, 2024 Patient Name: Tracy Jennings MRN:  [id number] DOB:  17-Jul-1956 Chief Complaint: Left sided weakness and confusion Requesting Provider: Clarine Ozell LABOR, MD  History of Present Illness  Nyhla Mountjoy Jennings is a 68 y.o. female with a PMHx of prior right hemispheric CVA, anemia, ESRD on HD, cervical cancer, DM2, HLD, HTN, PVD and pancreatitis who presents to the ED from home via EMS as a Code Stroke. LKN was 1700. Her sister noted sudden onset of right facial spasm versus and slurred speech at home that lasted for about 2-3 minutes and then resolved, prompting the call to EMS. Sister also felt that the patient was weak on the left. On EMS arrival, CBG was 110. She was afebrile with BP 121/54. Cardiac monitor revealed NSR with PVCs and a rate of 76. On EMS assessment, she confused with inability to follow commands and left sided weakness. Of note, she was seen yesterday in the [facility] ED for AMS and was noted to be hyperkalemic at that time.   BP in the ED is 170/76.   LKW: 1700 Modified rankin score: 0 IV Thrombolysis: No: Acute symptoms resolved.  EVT: No: Presentation not consistent with LVO  NIHSS components Score: Comment  1a Level of Conscious 0[x]  1[]  2[]  3[]      1b LOC Questions 0[x]  1[]  2[]      Some hesitancy with answers, but answers correctly. Gives the county instead of the city when asked. Knows that she is in [place]. Knows the day of the week. States the year is 1965Poor insight.   1c LOC Commands 0[x]  1[]  2[]       2 Best Gaze 0[]  1[x]  2[]       Right sided gaze preference, but can cross fully to the left  3 Visual 0[x]  1[]  2[]  3[]      4 Facial Palsy 0[x]  1[]  2[]  3[]      5a Motor Arm - left 0[x]  1[]  2[]  3[]  4[]  UN[]    5b Motor Arm - Right 0[x]  1[]  2[]  3[]  4[]  UN[]    6a Motor Leg - Left 0[x]  1[]  2[]  3[]  4[]  UN[]    6b Motor Leg - Right 0[x]  1[]  2[]  3[]  4[]  UN[]    7 Limb Ataxia 0[x]  1[]  2[]  UN[]      8 Sensory 0[]  1[x]  2[]  UN[]      Increased latencies of responses to left sided tactile stimuli, but states that both sides feel the same  9 Best Language 0[x]  1[]  2[]  3[]      10 Dysarthria 0[x]  1[]  2[]  UN[]      11 Extinct. and Inattention 0[]  1[x]  2[]      Intermittent left sided extinction to DSS  TOTAL:   3      ROS  In the context of patient having poor insight and being a poor historian, she denies any symptoms currently.   Past History   Past Medical History:  Diagnosis Date   Anemia 04/2018   low iron. to be started on supplements   Cervical cancer (HCC)    CKD (chronic kidney disease)    Stage IV   Complication of anesthesia    receceived too much anesthesia, that she was in coma for a couple days    COVID-19 virus detected 10/28/2019   Diabetes mellitus without complication (HCC)    type II   ESRD (end stage renal disease) (HCC)    Heart murmur    followed as a child only   HSIL (high grade squamous  intraepithelial lesion) on Pap smear of cervix    Hyperlipidemia associated with type 2 diabetes mellitus (HCC)    Hypertension    Pancreatitis    Peripheral vascular disease (HCC)    Past Surgical History:  Procedure Laterality Date   AMPUTATION TOE Left 2013   2nd toe. tip of toe (toe nail was infected)   AV FISTULA PLACEMENT Left 05/11/2018   Procedure: ARTERIOVENOUS (AV) FISTULA CREATION;  Surgeon: Jama Cordella MATSU, MD;  Location: [facility] ORS;  Service: Vascular;  Laterality: Left;   CATARACT EXTRACTION     CERVICAL CONIZATION W/BX N/A 04/08/2020   Procedure: CONIZATION CERVIX WITH BIOPSY;  Surgeon: Mancil Barter, MD;  Location: [facility] ORS;  Service: Gynecology;  Laterality: N/A;   CHOLECYSTECTOMY  2014   COLONOSCOPY     COLONOSCOPY WITH PROPOFOL  N/A 01/10/2018   Procedure: COLONOSCOPY WITH PROPOFOL ;  Surgeon: Toledo, Ladell POUR, MD;  Location: [facility] ENDOS[facility];  Service: Gastroenterology;  Laterality: N/A;   DIALYSIS/PERMA CATHETER INSERTION N/A 05/21/2018   Procedure: DIALYSIS/PERMA CATHETER INSERTION;   Surgeon: Marea Selinda RAMAN, MD;  Location: [facility];  Service: Cardiovascular;  Laterality: N/A;   DIALYSIS/PERMA CATHETER INSERTION N/A 07/14/2020   Procedure: DIALYSIS/PERMA CATHETER INSERTION;  Surgeon: Marea Selinda RAMAN, MD;  Location: [facility];  Service: Cardiovascular;  Laterality: N/A;   DIALYSIS/PERMA CATHETER REMOVAL N/A 04/11/2019   Procedure: DIALYSIS/PERMA CATHETER REMOVAL;  Surgeon: Marea Selinda RAMAN, MD;  Location: [facility];  Service: Cardiovascular;  Laterality: N/A;   EYE SURGERY Bilateral 2018   cataract extractions   IR IMAGING GUIDED PORT INSERTION  06/05/2020   THROMBECTOMY W/ EMBOLECTOMY  05/11/2018   Procedure: THROMBECTOMY ARTERIOVENOUS FISTULA;  Surgeon: Jama Cordella MATSU, MD;  Location: [facility] ORS;  Service: Vascular;;   TUBAL LIGATION  1984    Family History: Family History  Problem Relation Age of Onset   Stroke Mother    Hypertension Mother    Gout Mother    Cancer Father    Diabetes Sister    Diabetes Maternal Grandmother    Diabetes Maternal Grandfather    Diabetes Paternal Grandmother    Diabetes Paternal Grandfather     Social History  reports that she has never smoked. She has never used smokeless tobacco. She reports that she does not drink alcohol and does not use drugs.  Allergies  Allergen Reactions   Amlodipine  Swelling    Knees down to ankles Knees down to ankles   Hctz [Hydrochlorothiazide]     pancreatitis   Heparin  Other (See Comments)    Pt reports cardiac arrest when given heparin    Lmw Heparin      Reports cardiac arrest when given heparin    Other Other (See Comments)    Reports cardiac arrest when given during surgery Reports cardiac arrest when given during surgery Reports cardiac arrest when given during surgery    Sulfa Antibiotics Rash   Lactose    Dairy Aid [Tilactase]     Runny nose   Hydralazine      Nausea and rash    Medications  No current facility-administered medications for this  encounter.  Current Outpatient Medications:    amLODipine  (NORVASC ) 10 MG tablet, Take 10 mg by mouth daily., Disp: , Rfl:    calcium  acetate (PHOSLO ) 667 MG capsule, Take 1 capsule (667 mg total) by mouth 3 (three) times daily with meals., Disp: 90 capsule, Rfl: 3   carvedilol  (COREG ) 25 MG tablet, Take 1 tablet (25 mg total) by mouth 2 (  two) times daily with a meal., Disp: 60 tablet, Rfl: 3   cinacalcet (SENSIPAR) 30 MG tablet, Take 30 mg by mouth daily., Disp: , Rfl:    cyclobenzaprine  (FLEXERIL ) 10 MG tablet, Take 1 tablet (10 mg total) by mouth 3 (three) times daily as needed for muscle spasms., Disp: 30 tablet, Rfl: 0   diazepam  (VALIUM ) 5 MG tablet, Take 1 tablet (5 mg) 30-60 minutes prior to PET. If needed take second tablet (5 mg) 15 minutes prior to PET., Disp: 3 tablet, Rfl: 0   doxazosin  (CARDURA ) 4 MG tablet, Take 4 mg by mouth daily., Disp: , Rfl:    isosorbide  mononitrate (IMDUR ) 60 MG 24 hr tablet, Take 1 tablet (60 mg total) by mouth daily., Disp: 30 tablet, Rfl: 2   isosorbide  mononitrate (IMDUR ) 60 MG 24 hr tablet, Take 0.5 tablets (30 mg total) by mouth daily., Disp: 15 tablet, Rfl: 0   loperamide (IMODIUM) 2 MG capsule, Take 2 mg by mouth as needed for diarrhea or loose stools., Disp: , Rfl:    losartan  (COZAAR ) 100 MG tablet, SMARTSIG:1 Tablet(s) By Mouth Every Evening, Disp: , Rfl:    oxyCODONE  (ROXICODONE ) 5 MG immediate release tablet, Take 1-2 tablets (5-10 mg total) by mouth every 6 (six) hours as needed for severe pain., Disp: 60 tablet, Rfl: 0  Vitals   BP 138/69   Pulse 72   Temp 98.8 F (37.1 C)   Resp 10   LMP  (LMP Unknown)   SpO2 96%    Physical Exam   Constitutional: Appears frail. NAD.  Psych: Flattened affect and poor insight.  Eyes: No scleral injection.  HENT: No OP obstruction.  Head: Normocephalic.  Respiratory: Effort normal, non-labored breathing.    Neurologic Examination   See NIHSS  Labs/Imaging/Neurodiagnostic studies   CBC:   Recent Labs  Lab 03/04/24 1315  WBC 5.6  NEUTROABS 4.4  HGB 10.8*  HCT 34.0*  MCV 94.2  PLT 129*   Basic Metabolic Panel:  Lab Results  Component Value Date   NA 137 03/04/2024   K 6.2 (H) 03/04/2024   CO2 23 03/04/2024   GLUCOSE 80 03/04/2024   BUN 72 (H) 03/04/2024   CREATININE 9.67 (H) 03/04/2024   CALCIUM  8.7 (L) 03/04/2024   GFRNONAA 4 (L) 03/04/2024   GFRAA 3 (L) 06/15/2020   Lipid Panel:  Lab Results  Component Value Date   LDLCALC 58 12/11/2013   HgbA1c:  Lab Results  Component Value Date   HGBA1C 6.3 (H) 11/03/2019   Urine Drug Screen: No results found for: LABOPIA, COCAINSCRNUR, LABBENZ, AMPHETMU, THCU, LABBARB  Alcohol Level     Component Value Date/Time   ETH <10 10/28/2019 2055   INR  Lab Results  Component Value Date   INR 1.1 11/03/2019   APTT  Lab Results  Component Value Date   APTT 29 05/12/2018    ASSESSMENT  68 y.o. female with a PMHx of prior right hemispheric CVA, anemia, ESRD on HD, cervical cancer, DM2, HLD, HTN, PVD and pancreatitis who presents to the ED from home via EMS as a Code Stroke. LKN was 1700. Her sister noted sudden onset of right facial spasm versus and slurred speech at home that lasted for about 2-3 minutes and then resolved, prompting the call to EMS. Sister also felt that the patient was weak on the left. On EMS arrival, CBG was 110. She was afebrile with BP 121/54. Cardiac monitor revealed NSR with PVCs and a rate of 76. On  EMS assessment, she confused with inability to follow commands and left sided weakness.  - Exam reveals a confused patient with poor insight. Strength is normal x 4, but she is less attentive to stimuli on her left side than her right, consistent with her chronic right parietal lobe infarct. NIHSS 3.  - Labs: - BUN elevated at 47. Cr elevated at 7.33 with eGFR of 6.  - LFTs are normal.  - Na, K and Ca are normal - Glucose 100 - WBC normal - PT and INR are elevated - EtOH < 15 -  EKG: Right and left arm electrode reversal, interpretation assumes no reversal; Ectopic atrial rhythm; Probable left atrial enlargement consider left ventricular hypertrophy; abnormal T, consider ischemia, diffuse leads; ST elevation, consider anterior injury - CT head: No CT evidence of acute intracranial abnormality. 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 Extensive chronic microvascular ischemic changes with remote lacunar infarcts in the right caudate and right thalamus. ASPECTS is 10 - Impression: DDx for the patient's presentation includes TIA with symptoms referable to her left hemisphere, versus a partial complex seizure.   RECOMMENDATIONS  - MRI brain WITHOUT contrast - MRA head WITHOUT contrast - Carotid ultrasound - TTE - Cardiac telemetry - EEG (ordered) - HgbA1c, fasting lipid panel - PT consult, OT consult, Speech consult - ASA 325 mg po now followed by 81 mg po every day.  - Risk factor modification - Frequent neuro checks - NPO until passes stroke swallow screen  ______________________________________________________________________    Bonney SHARK, Ninette Cotta, MD [facility]

## 2024-03-06 ENCOUNTER — Encounter: Payer: Self-pay | Admitting: Internal Medicine

## 2024-03-06 ENCOUNTER — Observation Stay

## 2024-03-06 ENCOUNTER — Observation Stay (HOSPITAL_COMMUNITY)
Admit: 2024-03-06 | Discharge: 2024-03-06 | Disposition: A | Discharge status: Home / Self Care | Attending: Internal Medicine | Admitting: Internal Medicine

## 2024-03-06 DIAGNOSIS — G459 Transient cerebral ischemic attack, unspecified: Secondary | ICD-10-CM | POA: Diagnosis not present

## 2024-03-06 DIAGNOSIS — I10 Essential (primary) hypertension: Secondary | ICD-10-CM | POA: Diagnosis not present

## 2024-03-06 DIAGNOSIS — N186 End stage renal disease: Secondary | ICD-10-CM | POA: Diagnosis not present

## 2024-03-06 LAB — LIPID PANEL
Cholesterol: 167 mg/dL (ref 0–200)
HDL: 56 mg/dL (ref >40)
LDL Cholesterol: 96 mg/dL (ref 0–99)
Total CHOL/HDL Ratio: 3 ratio
Triglycerides: 76 mg/dL (ref <150)
VLDL: 15 mg/dL (ref 0–40)

## 2024-03-06 LAB — HIV ANTIBODY (ROUTINE TESTING W REFLEX): HIV Screen 4th Generation wRfx: NONREACTIVE

## 2024-03-06 LAB — HEMOGLOBIN A1C
Hgb A1c MFr Bld: 5.1 % (ref 4.8–5.6)
Mean Plasma Glucose: 99.67 mg/dL

## 2024-03-06 LAB — TROPONIN I (HIGH SENSITIVITY)
Troponin I (High Sensitivity): 43 ng/L — ABNORMAL HIGH (ref <18)
Troponin I (High Sensitivity): 41 ng/L — ABNORMAL HIGH (ref <18)

## 2024-03-06 MED ORDER — CARVEDILOL 25 MG PO TABS
25.0000 mg | ORAL_TABLET | Freq: Two times a day (BID) | ORAL | Status: DC
  Administered 2024-03-06 – 2024-03-28 (×42): 25 mg via ORAL
  Filled 2024-03-06 (×42): qty 1

## 2024-03-06 MED ORDER — CLONIDINE HCL 0.1 MG PO TABS
0.2000 mg | ORAL_TABLET | Freq: Three times a day (TID) | ORAL | Status: DC
  Administered 2024-03-06: 0.2 mg via ORAL
  Filled 2024-03-06: qty 2

## 2024-03-06 MED ORDER — LABETALOL HCL 5 MG/ML IV SOLN
10.0000 mg | INTRAVENOUS | Status: DC | PRN
  Administered 2024-03-06 – 2024-03-10 (×6): 10 mg via INTRAVENOUS
  Filled 2024-03-06 (×7): qty 4

## 2024-03-06 MED ORDER — CHLORHEXIDINE GLUCONATE CLOTH 2 % EX PADS
6.0000 | MEDICATED_PAD | Freq: Every day | CUTANEOUS | Status: DC
  Administered 2024-03-06 – 2024-04-02 (×27): 6 via TOPICAL

## 2024-03-06 MED ORDER — DILTIAZEM HCL ER COATED BEADS 120 MG PO CP24
120.0000 mg | ORAL_CAPSULE | Freq: Every day | ORAL | Status: DC
  Administered 2024-03-06 – 2024-03-21 (×15): 120 mg via ORAL
  Filled 2024-03-06 (×17): qty 1

## 2024-03-06 MED ORDER — HALOPERIDOL LACTATE 5 MG/ML IJ SOLN: 2.0000 mg | Freq: Four times a day (QID) | INTRAMUSCULAR | Status: DC | PRN

## 2024-03-06 MED ORDER — DOXAZOSIN MESYLATE 4 MG PO TABS
4.0000 mg | ORAL_TABLET | Freq: Every day | ORAL | Status: DC
  Administered 2024-03-06 – 2024-03-23 (×17): 4 mg via ORAL
  Filled 2024-03-06 (×19): qty 1

## 2024-03-06 MED ORDER — LORAZEPAM 2 MG/ML IJ SOLN
2.0000 mg | Freq: Once | INTRAMUSCULAR | Status: AC
  Administered 2024-03-06: 2 mg via INTRAVENOUS
  Filled 2024-03-06: qty 1

## 2024-03-06 MED ORDER — SODIUM CHLORIDE 0.9% FLUSH
10.0000 mL | INTRAVENOUS | Status: DC | PRN
  Administered 2024-03-10: 10 mL

## 2024-03-06 MED ORDER — LORAZEPAM 2 MG/ML IJ SOLN
0.5000 mg | Freq: Once | INTRAMUSCULAR | Status: AC | PRN
Start: 2024-03-06 — End: 2024-03-06
  Administered 2024-03-06: 0.5 mg via INTRAVENOUS
  Filled 2024-03-06: qty 1

## 2024-03-06 MED ORDER — LOSARTAN POTASSIUM 50 MG PO TABS
100.0000 mg | ORAL_TABLET | Freq: Every day | ORAL | Status: DC
  Administered 2024-03-06 – 2024-04-02 (×26): 100 mg via ORAL
  Filled 2024-03-06 (×26): qty 2

## 2024-03-06 MED ORDER — CHLORHEXIDINE GLUCONATE CLOTH 2 % EX PADS
6.0000 | MEDICATED_PAD | Freq: Every day | CUTANEOUS | Status: DC
  Administered 2024-03-06 – 2024-03-14 (×6): 6 via TOPICAL

## 2024-03-06 MED ORDER — ISOSORBIDE MONONITRATE ER 30 MG PO TB24
60.0000 mg | ORAL_TABLET | Freq: Every day | ORAL | Status: DC
  Administered 2024-03-06 – 2024-03-07 (×2): 60 mg via ORAL
  Filled 2024-03-06 (×3): qty 2

## 2024-03-06 NOTE — Progress Notes (Addendum)
 [facility]  ROUNDING NOTE   Subjective:   Tracy Jennings is a 68 year old female with past medical conditions including dementia, hypertension, diabetes, hyperlipidemia, cervical cancer, recurrent pancreatitis, PVD, and end-stage renal disease on hemodialysis.  Patient presents to the emergency department after experiencing slurred speech, left-sided facial spasms and weakness.  Patient has been admitted for TIA (transient ischemic attack) [G45.9]  Patient is known to our practice and receives outpatient dialysis treatments at [facility] on a MWF schedule, supervised by Dr. Dennise.  Last treatment received on Monday.  Patient states she was having a conversation with her sister when she began to have facial spasms.  Reports her mouth began to twist involuntarily and her speech slurred.  Labs on ED arrival unremarkable for renal patient.  Brain imaging negative for acute infarct.  Carotid ultrasound shows right carotid stenosis 50 to 60% without significant plaque.  We have been consulted to manage dialysis needs during this admission.   Objective:  Vital signs in last 24 hours:  Temp:  [97.9 F (36.6 C)-98.9 F (37.2 C)] 98.2 F (36.8 C) (06/25 1228) Pulse Rate:  [72-79] 77 (06/25 1330) Resp:  [9-24] 9 (06/25 1300) BP: (138-207)/(56-118) 207/78 (06/25 1330) SpO2:  [89 %-100 %] 100 % (06/25 1330) Weight:  [54.4 kg-56.1 kg] 54.4 kg (06/25 1228)  Weight change:  Filed Weights   03/06/24 0100 03/06/24 1228  Weight: 56.1 kg 54.4 kg    Intake/Output: No intake/output data recorded.   Intake/Output this shift:  No intake/output data recorded.  Physical Exam: General: NAD  Head: Normocephalic, atraumatic. Moist oral mucosal membranes  Eyes: Anicteric  Neck: Supple  Lungs:  Clear to auscultation  Heart: Regular rate and rhythm  Abdomen:  Soft, nontender  Extremities:  peripheral edema.  Neurologic: Awake, alert, conversant  Skin: Warm,dry, no rash   Access: Left aVF    Basic Metabolic Panel: Recent Labs  Lab 03/04/24 1239 03/05/24 1810  NA 137 135  K 6.2* 4.6  CL 100 95*  CO2 23 26  GLUCOSE 80 100*  BUN 72* 47*  CREATININE 9.67* 7.33*  CALCIUM  8.7* 9.2    Liver Function Tests: Recent Labs  Lab 03/04/24 1239 03/05/24 1810  AST 26 23  ALT 16 14  ALKPHOS 30* 29*  BILITOT 0.9 0.7  PROT 7.2 6.9  ALBUMIN 3.7 3.5   No results for input(s): LIPASE, AMYLASE in the last 168 hours. No results for input(s): AMMONIA in the last 168 hours.  CBC: Recent Labs  Lab 03/04/24 1315 03/05/24 1810  WBC 5.6 4.3  NEUTROABS 4.4 2.8  HGB 10.8* 10.9*  HCT 34.0* 33.9*  MCV 94.2 94.2  PLT 129* 150    Cardiac Enzymes: No results for input(s): CKTOTAL, CKMB, CKMBINDEX, TROPONINI in the last 168 hours.  BNP: Invalid input(s): POCBNP  CBG: No results for input(s): GLUCAP in the last 168 hours.  Microbiology: Results for orders placed or performed during the hospital encounter of 09/14/22  Resp panel by RT-PCR (RSV, Flu A&B, Covid) Anterior Nasal Swab     Status: None   Collection Time: 09/14/22  2:45 AM   Specimen: Anterior Nasal Swab  Result Value Ref Range Status   SARS Coronavirus 2 by RT PCR NEGATIVE NEGATIVE Final    Comment: (NOTE) SARS-CoV-2 target nucleic acids are NOT DETECTED.  The SARS-CoV-2 RNA is generally detectable in upper respiratory specimens during the acute phase of infection. The lowest concentration of SARS-CoV-2 viral copies this assay can detect is  138 copies/mL. A negative result does not preclude SARS-Cov-2 infection and should not be used as the sole basis for treatment or other patient management decisions. A negative result may occur with  improper specimen collection/handling, submission of specimen other than nasopharyngeal swab, presence of viral mutation(s) within the areas targeted by this assay, and inadequate number of viral copies(<138 copies/mL). A negative result  must be combined with clinical observations, patient history, and epidemiological information. The expected result is Negative.  Fact Sheet for Patients:  BloggerCourse.com  Fact Sheet for Healthcare Providers:  SeriousBroker.it  This test is no t yet approved or cleared by the [place]  FDA and  has been authorized for detection and/or diagnosis of SARS-CoV-2 by FDA under an Emergency Use Authorization (EUA). This EUA will remain  in effect (meaning this test can be used) for the duration of the COVID-19 declaration under Section 564(b)(1) of the Act, 21 U.S.C.section 360bbb-3(b)(1), unless the authorization is terminated  or revoked sooner.       Influenza A by PCR NEGATIVE NEGATIVE Final   Influenza B by PCR NEGATIVE NEGATIVE Final    Comment: (NOTE) The Xpert Xpress SARS-CoV-2/FLU/RSV plus assay is intended as an aid in the diagnosis of influenza from Nasopharyngeal swab specimens and should not be used as a sole basis for treatment. Nasal washings and aspirates are unacceptable for Xpert Xpress SARS-CoV-2/FLU/RSV testing.  Fact Sheet for Patients: BloggerCourse.com  Fact Sheet for Healthcare Providers: SeriousBroker.it  This test is not yet approved or cleared by the [place]  FDA and has been authorized for detection and/or diagnosis of SARS-CoV-2 by FDA under an Emergency Use Authorization (EUA). This EUA will remain in effect (meaning this test can be used) for the duration of the COVID-19 declaration under Section 564(b)(1) of the Act, 21 U.S.C. section 360bbb-3(b)(1), unless the authorization is terminated or revoked.     Resp Syncytial Virus by PCR NEGATIVE NEGATIVE Final    Comment: (NOTE) Fact Sheet for Patients: BloggerCourse.com  Fact Sheet for Healthcare Providers: SeriousBroker.it  This test is  not yet approved or cleared by the [place]  FDA and has been authorized for detection and/or diagnosis of SARS-CoV-2 by FDA under an Emergency Use Authorization (EUA). This EUA will remain in effect (meaning this test can be used) for the duration of the COVID-19 declaration under Section 564(b)(1) of the Act, 21 U.S.C. section 360bbb-3(b)(1), unless the authorization is terminated or revoked.  Performed at [facility], [address] [place], [place] [address]     Coagulation Studies: Recent Labs    03/05/24 1810  LABPROT 16.0*  INR 1.3*    Urinalysis: No results for input(s): COLORURINE, LABSPEC, PHURINE, GLUCOSEU, HGBUR, BILIRUBINUR, KETONESUR, PROTEINUR, UROBILINOGEN, NITRITE, LEUKOCYTESUR in the last 72 hours.  Invalid input(s): APPERANCEUR    Imaging: US  Carotid Bilateral (at [facility] and [facility] only) Result Date: 03/06/2024 CLINICAL DATA:  Transient ischemic attack EXAM: BILATERAL CAROTID DUPLEX ULTRASOUND TECHNIQUE: Elnor scale imaging, color Doppler and duplex ultrasound were performed of bilateral carotid and vertebral arteries in the neck. COMPARISON:  None Available. FINDINGS: Criteria: Quantification of carotid stenosis is based on velocity parameters that correlate the residual internal carotid diameter with NASCET-based stenosis levels, using the diameter of the distal internal carotid lumen as the denominator for stenosis measurement. The following velocity measurements were obtained: RIGHT ICA: 186 cm/sec CCA: 60 cm/sec SYSTOLIC ICA/CCA RATIO:  3.1 ECA: 112 cm/sec LEFT ICA: 103 cm/sec CCA: 166 cm/sec SYSTOLIC ICA/CCA RATIO:  0.6 ECA: 37 cm/sec RIGHT  CAROTID ARTERY: There is no significant plaque in the proximal right internal carotid artery. An elevated velocity is measured in the distal right internal carotid artery. RIGHT VERTEBRAL ARTERY:  Antegrade LEFT CAROTID ARTERY:  There is no significant plaque. LEFT VERTEBRAL ARTERY:  Antegrade.  IMPRESSION: 1. Right carotid system: An elevated velocity is measured within the distal right internal carotid artery which suggests a stenosis between 50 and 69%. There is no significant plaque identified and therefore this may be artifactual. Consideration can be given toward further evaluation by CT angiogram for further characterization if clinically appropriate. 2. Left carotid system: No evidence of significant flow limiting stenosis. Electronically Signed   By: Maude Naegeli M.D.   On: 03/06/2024 12:47   MR ANGIO HEAD WO CONTRAST Result Date: 03/06/2024 CLINICAL DATA:  Stroke/TIA, determine embolic source EXAM: MRA HEAD WITHOUT CONTRAST TECHNIQUE: Angiographic images of the Circle of Willis were acquired using MRA technique without intravenous contrast. COMPARISON:  CT head dated March 05, 2024. FINDINGS: Anterior circulation: Limited secondary to patient motion. Diminutive or stenotic right A1 segment. No obvious aneurysm or large vessel occlusion. Posterior circulation: Limited secondary to motion. Moderate focal stenosis of the proximal basilar artery. Anatomic variants: None. Other: None. IMPRESSION: 1. Limited study secondary to patient motion. 2. 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 Electronically Signed   By: Evalene Coho M.D.   On: 03/06/2024 10:36   MR BRAIN WO CONTRAST Result Date: 03/06/2024 CLINICAL DATA:  Transient ischemic attack (TIA) EXAM: MRI HEAD WITHOUT CONTRAST TECHNIQUE: Multiplanar, multiecho pulse sequences of the brain and surrounding structures were obtained without intravenous contrast. COMPARISON:  CT of the head dated March 05, 2024. FINDINGS: Brain: There is no restricted diffusion to indicate acute or recent infarction. There are encephalomalacia changes within the right parietal lobe. A chronic lacunar infarct is noted within the right caudate nucleus. There is moderate diffuse cerebral white matter disease present. There is no evidence of  hemorrhage, mass, acute cortical infarct or hydrocephalus. Vascular: Normal flow voids. Skull and upper cervical spine: Normal marrow signal. Sinuses/Orbits: Status post bilateral lens replacement. Mild mucosal disease within the maxillary sinuses. Other: None. IMPRESSION: 1. Chronic encephalomalacia within the right parietal lobe and moderately advanced diffuse cerebral white matter disease. Electronically Signed   By: Evalene Coho M.D.   On: 03/06/2024 10:31   CT HEAD CODE STROKE WO CONTRAST Result Date: 03/05/2024 CLINICAL DATA:  Code stroke.  Neuro deficit, concern for stroke. EXAM: CT HEAD WITHOUT CONTRAST TECHNIQUE: Contiguous axial images were obtained from the base of the skull through the vertex without intravenous contrast. RADIATION DOSE REDUCTION: This exam was performed according to the departmental dose-optimization program which includes automated exposure control, adjustment of the mA and/or kV according to patient size and/or use of iterative reconstruction technique. COMPARISON:  CT head 03/04/2024 and earlier. FINDINGS: Brain: No acute intracranial hemorrhage. No CT evidence of new large territory infarct. Encephalomalacia in the right parietal lobe compatible with remote infarct a similar appearance of associated calcification. Nonspecific hypoattenuation in the periventricular and subcortical white matter favored to reflect chronic microvascular ischemic changes. Additional punctate calcification in the right frontal lobe. Remote lacunar infarcts in the right caudate and right thalamus. Mild parenchymal volume loss. No midline shift. Basilar cisterns are patent. Ventricles: The ventricles are normal. Vascular: Atherosclerotic calcifications of the carotid siphons. No hyperdense vessel. Skull: No acute or aggressive finding. Orbits: Bilateral lens replacement.  Orbits otherwise unremarkable. Sinuses: Mucosal thickening in the right maxillary sinus.  Other: Trace fluid in the left mastoid  tip. Calcification of multiple vessels in the scalp. ASPECTS [facility] Stroke Program Early CT Score) - Ganglionic level infarction (caudate, lentiform nuclei, internal capsule, insula, M1-M3 cortex): 7 - Supraganglionic infarction (M4-M6 cortex): 3 Total score (0-10 with 10 being normal): 10 IMPRESSION: 1. No CT evidence of acute intracranial abnormality. 2. 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 Extensive chronic microvascular ischemic changes with remote lacunar infarcts in the right caudate and right thalamus. 3. ASPECTS is 10 These results were communicated to Dr. Lindzen at 6:15 pm on 03/05/2024 by text page via the [facility] messaging system. Electronically Signed   By: Donnice Mania M.D.   On: 03/05/2024 18:15     Medications:     aspirin   300 mg Rectal Daily   Or   aspirin   325 mg Oral Daily   atorvastatin   40 mg Oral Daily   carvedilol   25 mg Oral BID WC   Chlorhexidine  Gluconate Cloth  6 each Topical Daily   Chlorhexidine  Gluconate Cloth  6 each Topical Q0600   diltiazem   120 mg Oral Daily   doxazosin   4 mg Oral Daily   isosorbide  mononitrate  60 mg Oral Daily   losartan   100 mg Oral Daily   acetaminophen  **OR** acetaminophen  (TYLENOL ) oral liquid 160 mg/5 mL **OR** acetaminophen , enalaprilat , labetalol , LORazepam , ondansetron  (ZOFRAN ) IV, senna-docusate, sodium chloride  flush  Assessment/ Plan:  [name] is a 68 y.o.  female with past medical conditions including dementia, hypertension, diabetes, hyperlipidemia, cervical cancer, recurrent pancreatitis, PVD, and end-stage renal disease on hemodialysis.  Patient presents to the emergency department after experiencing slurred speech, left-sided facial spasms and weakness.  Patient has been admitted for TIA (transient ischemic attack) [G45.9]   Transient ischemic attack, reporting symptoms of slurred speech, involuntary left-sided facial spasms and weakness.  Symptoms have resolved.  Neurology consulted, TIA versus  partial complex seizure.  Brain imaging negative for acute infarct.  Carotid ultrasound shows right-sided 50 to 60% stenosis without plaque.  TEE ordered.  Blood pressure medications initially held.  2.  End-stage renal disease on hemodialysis.  Last treatment received on Friday.  Patient receiving treatment today, UF 1 to 1.5 L.  Next treatment scheduled for Wednesday.  Patient is displaying some disorientation during treatment, requiring multiple rounds of redirection.  If safety concerns arise, may terminate treatment early.  3.  Hypertensive urgency, blood pressure severely elevated on admission.  Home regimen includes amlodipine , carvedilol , doxazosin , losartan , and isosorbide .  All were held.  Patient's blood pressure remains elevated.  Will restart above antihypertensives at home doses.  Will continue to hold amlodipine  for now.  4. Secondary Hyperparathyroidism: with outpatient labs:   Lab Results  Component Value Date   CALCIUM  9.2 03/05/2024   PHOS 7.1 (H) 04/01/2022    Calcium  acceptable.  Will obtain updated phosphorus with dialysis.  Patient is prescribed calcium  acetate with meals outpatient.    LOS: 0 Cordie Buening 6/25/20253:07 PM

## 2024-03-06 NOTE — Procedures (Signed)
 Pt in dialysis today. Can try to do eeg later or tomorrow.

## 2024-03-06 NOTE — Discharge Summary (Addendum)
 Addendum: Upon being presented with AMA paperwork patient is unable to sign her name.  She is becoming increasingly confused.  Her sisters at bedside are refusing to take her home, they also will not cosign the [facility] paperwork.  Patient is now demonstrating that she does not have capacity to make these decisions.  She may require IVC and psychiatry consult in the morning.  For now we will proceed with Ativan  to assist in her agitation.    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 [facility] Course: This patient is a 68 year old female with ESRD on HD MWF, HTN, HLD, DM, PVD, dementia, chronic recurrent pancreatitis, stroke, cervical cancer, HIT, who presents with slurred speech, left facial spasm, left-sided weakness.  Patient had syncopal episode on 6/23 and was seen in the ED.  At that time she was found to have O2 desaturation to 80% due to pulmonary edema on chest x-ray as well as hyperkalemia with a potassium of 6.2.  She had negative head CT during that admission.  She received hemodialysis and then was discharged home.  Her last known normal was 6/24 at 1700.  Upon arrival to the ED patient's confusion had resolved entirely.  Labs in the ED mostly unremarkable, potassium 4.6, creatinine consistent with ESRD.  Blood pressure was found to be 252/79.   Patient was placed in telemetry bed for observation.  Code stroke was called.Blood pressure resolved.  Head CT was without acute intracranial abnormality.  Chronic infarct seen on right parietal lobe and extensive chronic microvascular ischemic changes with remote lacunar infarcts in the right caudate and right thalamus appreciated.  Patient was admitted for workup of presumed TIA.  LDL 96, hemoglobin A1c 5.1%.  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  MRI brain revealed chronic encephalomalacia within the right parietal lobe and advanced diffuse cerebral white matter disease.  Patient underwent carotid Dopplers which revealed elevated velocity within the distal right internal carotid artery suggesting stenosis of 50 to 69% though no significant plaque identified and thus this may be artifactual. Patient underwent hemodialysis on 6/25 but left her session early AMA.  When she returned to the room she reported that she wanted to leave AGAINST MEDICAL ADVICE.  I had extensive discussion with the patient at bedside.  Also present for this discussion was nurse, Camillia, and patient's 2 sisters.  The patient was able to demonstrate medical capacity.  She was alert and oriented x 4 and appropriately repeated my concerns with her leaving including stroke, heart attack, death.  Systolic blood pressure remains elevated above 200 despite dialysis today.  When I discussed with the patient that we are adding additional blood pressure agents that she reported that she had things to do and she would follow-up on this outpatient.  Ultimately she left AGAINST MEDICAL ADVICE despite my counseling. She was  encouraged to return to the ER.        Consultants: Nephrology, Neurology Procedures performed:   Disposition: AMA Diet recommendation:  Renal diet DISCHARGE MEDICATION:   Follow-up Information     Zachary Idelia LABOR, MD Follow up.   Specialty: Family Medicine Why: Hospital follow  up Contact information: [address] [place] [address] [phone]                Discharge Exam: Filed Weights   03/06/24 0100 03/06/24 1228  Weight: 56.1 kg 54.4 kg   Constitutional:  Normal appearance. Non toxic-appearing.  HENT: Head Normocephalic and atraumatic.  Mucous membranes are moist.  Eyes:  Extraocular intact. Conjunctivae normal. Pupils are equal, round, and reactive to light.  Cardiovascular: Rate and Rhythm: Normal rate and regular rhythm.  Pulmonary: Non labored, symmetric rise of chest wall.  Musculoskeletal:  Normal range of motion.  Skin: warm and dry. not jaundiced.  Neurological: No focal deficit present. alert. Oriented. Psychiatric: Mood and Affect congruent.    Condition at discharge: serious  The results of significant diagnostics from this hospitalization (including imaging, microbiology, ancillary and laboratory) are listed below for reference.   Imaging Studies: US  Carotid Bilateral (at [facility] and [facility] only) Result Date: 03/06/2024 CLINICAL DATA:  Transient ischemic attack EXAM: BILATERAL CAROTID DUPLEX ULTRASOUND TECHNIQUE: Elnor scale imaging, color Doppler and duplex ultrasound were performed of bilateral carotid and vertebral arteries in the neck. COMPARISON:  None Available. FINDINGS: Criteria: Quantification of carotid stenosis is based on velocity parameters that correlate the residual internal carotid diameter with NASCET-based stenosis levels, using the diameter of the distal internal carotid lumen as the denominator for stenosis measurement. The following velocity measurements were obtained: RIGHT ICA: 186 cm/sec CCA: 60 cm/sec SYSTOLIC ICA/CCA RATIO:  3.1 ECA: 112 cm/sec LEFT ICA: 103 cm/sec CCA: 166 cm/sec SYSTOLIC ICA/CCA RATIO:  0.6 ECA: 37 cm/sec RIGHT CAROTID ARTERY: There is no significant plaque in the proximal right internal carotid artery. An elevated velocity is measured in the distal right internal carotid artery. RIGHT  VERTEBRAL ARTERY:  Antegrade LEFT CAROTID ARTERY:  There is no significant plaque. LEFT VERTEBRAL ARTERY:  Antegrade. IMPRESSION: 1. Right carotid system: An elevated velocity is measured within the distal right internal carotid artery which suggests a stenosis between 50 and 69%. There is no significant plaque identified and therefore this may be artifactual. Consideration can be given toward further evaluation by CT angiogram for further characterization if clinically appropriate. 2. Left carotid system: No evidence of significant flow limiting stenosis. Electronically Signed   By: Maude Naegeli M.D.   On: 03/06/2024 12:47   MR ANGIO HEAD WO CONTRAST Result Date: 03/06/2024 CLINICAL DATA:  Stroke/TIA, determine embolic source EXAM: MRA HEAD WITHOUT CONTRAST TECHNIQUE: Angiographic images of the Circle of Willis were acquired using MRA technique without intravenous contrast. COMPARISON:  CT head dated March 05, 2024. FINDINGS: Anterior circulation: Limited secondary to patient motion. Diminutive or stenotic right A1 segment. No obvious aneurysm or large vessel occlusion. Posterior circulation: Limited secondary to motion. Moderate focal stenosis of the proximal basilar artery. Anatomic variants: None. Other: None. IMPRESSION: 1. Limited study secondary to patient motion. 2. 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 Electronically Signed   By: Evalene Coho M.D.   On: 03/06/2024 10:36   MR BRAIN WO CONTRAST Result Date: 03/06/2024 CLINICAL DATA:  Transient ischemic attack (TIA) EXAM: MRI HEAD WITHOUT CONTRAST TECHNIQUE: Multiplanar, multiecho pulse sequences of the brain and surrounding structures were  obtained without intravenous contrast. COMPARISON:  CT of the head dated March 05, 2024. FINDINGS: Brain: There is no restricted diffusion to indicate acute or recent infarction. There are encephalomalacia changes within the right parietal lobe. A chronic lacunar infarct is noted  within the right caudate nucleus. There is moderate diffuse cerebral white matter disease present. There is no evidence of hemorrhage, mass, acute cortical infarct or hydrocephalus. Vascular: Normal flow voids. Skull and upper cervical spine: Normal marrow signal. Sinuses/Orbits: Status post bilateral lens replacement. Mild mucosal disease within the maxillary sinuses. Other: None. IMPRESSION: 1. Chronic encephalomalacia within the right parietal lobe and moderately advanced diffuse cerebral white matter disease. Electronically Signed   By: Evalene Coho M.D.   On: 03/06/2024 10:31   CT HEAD CODE STROKE WO CONTRAST Result Date: 03/05/2024 CLINICAL DATA:  Code stroke.  Neuro deficit, concern for stroke. EXAM: CT HEAD WITHOUT CONTRAST TECHNIQUE: Contiguous axial images were obtained from the base of the skull through the vertex without intravenous contrast. RADIATION DOSE REDUCTION: This exam was performed according to the departmental dose-optimization program which includes automated exposure control, adjustment of the mA and/or kV according to patient size and/or use of iterative reconstruction technique. COMPARISON:  CT head 03/04/2024 and earlier. FINDINGS: Brain: No acute intracranial hemorrhage. No CT evidence of new large territory infarct. Encephalomalacia in the right parietal lobe compatible with remote infarct a similar appearance of associated calcification. Nonspecific hypoattenuation in the periventricular and subcortical white matter favored to reflect chronic microvascular ischemic changes. Additional punctate calcification in the right frontal lobe. Remote lacunar infarcts in the right caudate and right thalamus. Mild parenchymal volume loss. No midline shift. Basilar cisterns are patent. Ventricles: The ventricles are normal. Vascular: Atherosclerotic calcifications of the carotid siphons. No hyperdense vessel. Skull: No acute or aggressive finding. Orbits: Bilateral lens replacement.  Orbits  otherwise unremarkable. Sinuses: Mucosal thickening in the right maxillary sinus. Other: Trace fluid in the left mastoid tip. Calcification of multiple vessels in the scalp. ASPECTS [facility] Stroke Program Early CT Score) - Ganglionic level infarction (caudate, lentiform nuclei, internal capsule, insula, M1-M3 cortex): 7 - Supraganglionic infarction (M4-M6 cortex): 3 Total score (0-10 with 10 being normal): 10 IMPRESSION: 1. No CT evidence of acute intracranial abnormality. 2. 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 Extensive chronic microvascular ischemic changes with remote lacunar infarcts in the right caudate and right thalamus. 3. ASPECTS is 10 These results were communicated to Dr. Lindzen at 6:15 pm on 03/05/2024 by text page via the [facility] messaging system. Electronically Signed   By: Donnice Mania M.D.   On: 03/05/2024 18:15   DG Chest 2 View Result Date: 03/04/2024 CLINICAL DATA:  Shortness of breath EXAM: CHEST - 2 VIEW COMPARISON:  09/14/2022 FINDINGS: Right Port-A-Cath remains in place, unchanged. Heart and mediastinal contours within normal limits. Moderate left pleural effusion noted. Vascular congestion and interstitial/airspace opacities bilaterally, likely reflecting edema although infection not excluded. No visible significant right effusion. No pneumothorax. No acute bony abnormality. IMPRESSION: Moderate left pleural effusion. Vascular congestion with bilateral diffuse interstitial prominence and airspace opacities concerning for edema although infection not excluded. Electronically Signed   By: Franky Crease M.D.   On: 03/04/2024 15:11   CT Head Wo Contrast Result Date: 03/04/2024 CLINICAL DATA:  Mental status change, unknown cause.  Recent falls. EXAM: CT HEAD WITHOUT CONTRAST TECHNIQUE: Contiguous axial images were obtained from the base of the skull through the vertex without intravenous contrast. RADIATION DOSE REDUCTION: This exam was performed according  to the departmental  dose-optimization program which includes automated exposure control, adjustment of the mA and/or kV according to patient size and/or use of iterative reconstruction technique. COMPARISON:  Head CT 03/29/2022 FINDINGS: Brain: There is no evidence of an acute infarct, intracranial hemorrhage, mass, midline shift, or extra-axial fluid collection. A moderate-sized chronic right parietal cortical infarct is again noted with a small amount of associated calcification. A punctate hyperdense focus in the subcortical white matter of the anterior right frontal lobe is also unchanged and may reflect calcification. Patchy to confluent hypodensities elsewhere in the cerebral white matter bilaterally are nonspecific but compatible with extensive chronic small vessel ischemic disease. There unchanged chronic lacunar infarcts in the bilateral caudate nuclei. There is mild cerebral atrophy. Vascular: Calcified atherosclerosis at the skull base. No hyperdense vessel. Skull: No fracture or suspicious lesion. Sinuses/Orbits: Visualized paranasal sinuses are clear. Trace left mastoid effusion. Bilateral cataract extraction. Other: None. IMPRESSION: 1. No evidence of acute intracranial abnormality. 2. Chronic right parietal infarct and extensive chronic small vessel ischemic disease. Electronically Signed   By: Dasie Hamburg M.D.   On: 03/04/2024 14:47    Microbiology: Results for orders placed or performed during the hospital encounter of 09/14/22  Resp panel by RT-PCR (RSV, Flu A&B, Covid) Anterior Nasal Swab     Status: None   Collection Time: 09/14/22  2:45 AM   Specimen: Anterior Nasal Swab  Result Value Ref Range Status   SARS Coronavirus 2 by RT PCR NEGATIVE NEGATIVE Final    Comment: (NOTE) SARS-CoV-2 target nucleic acids are NOT DETECTED.  The SARS-CoV-2 RNA is generally detectable in upper respiratory specimens during the acute phase of infection. The lowest concentration of SARS-CoV-2 viral copies this assay can  detect is 138 copies/mL. A negative result does not preclude SARS-Cov-2 infection and should not be used as the sole basis for treatment or other patient management decisions. A negative result may occur with  improper specimen collection/handling, submission of specimen other than nasopharyngeal swab, presence of viral mutation(s) within the areas targeted by this assay, and inadequate number of viral copies(<138 copies/mL). A negative result must be combined with clinical observations, patient history, and epidemiological information. The expected result is Negative.  Fact Sheet for Patients:  BloggerCourse.com  Fact Sheet for Healthcare Providers:  SeriousBroker.it  This test is no t yet approved or cleared by the [place]  FDA and  has been authorized for detection and/or diagnosis of SARS-CoV-2 by FDA under an Emergency Use Authorization (EUA). This EUA will remain  in effect (meaning this test can be used) for the duration of the COVID-19 declaration under Section 564(b)(1) of the Act, 21 U.S.C.section 360bbb-3(b)(1), unless the authorization is terminated  or revoked sooner.       Influenza A by PCR NEGATIVE NEGATIVE Final   Influenza B by PCR NEGATIVE NEGATIVE Final    Comment: (NOTE) The Xpert Xpress SARS-CoV-2/FLU/RSV plus assay is intended as an aid in the diagnosis of influenza from Nasopharyngeal swab specimens and should not be used as a sole basis for treatment. Nasal washings and aspirates are unacceptable for Xpert Xpress SARS-CoV-2/FLU/RSV testing.  Fact Sheet for Patients: BloggerCourse.com  Fact Sheet for Healthcare Providers: SeriousBroker.it  This test is not yet approved or cleared by the [place]  FDA and has been authorized for detection and/or diagnosis of SARS-CoV-2 by FDA under an Emergency Use Authorization (EUA). This EUA will remain in  effect (meaning this test can be used) for the duration of the COVID-19 declaration  under Section 564(b)(1) of the Act, 21 U.S.C. section 360bbb-3(b)(1), unless the authorization is terminated or revoked.     Resp Syncytial Virus by PCR NEGATIVE NEGATIVE Final    Comment: (NOTE) Fact Sheet for Patients: BloggerCourse.com  Fact Sheet for Healthcare Providers: SeriousBroker.it  This test is not yet approved or cleared by the [place]  FDA and has been authorized for detection and/or diagnosis of SARS-CoV-2 by FDA under an Emergency Use Authorization (EUA). This EUA will remain in effect (meaning this test can be used) for the duration of the COVID-19 declaration under Section 564(b)(1) of the Act, 21 U.S.C. section 360bbb-3(b)(1), unless the authorization is terminated or revoked.  Performed at [facility], [address] [place], [place] [address]     Labs: CBC: Recent Labs  Lab 03/04/24 1315 03/05/24 1810  WBC 5.6 4.3  NEUTROABS 4.4 2.8  HGB 10.8* 10.9*  HCT 34.0* 33.9*  MCV 94.2 94.2  PLT 129* 150   Basic Metabolic Panel: Recent Labs  Lab 03/04/24 1239 03/05/24 1810  NA 137 135  K 6.2* 4.6  CL 100 95*  CO2 23 26  GLUCOSE 80 100*  BUN 72* 47*  CREATININE 9.67* 7.33*  CALCIUM  8.7* 9.2   Liver Function Tests: Recent Labs  Lab 03/04/24 1239 03/05/24 1810  AST 26 23  ALT 16 14  ALKPHOS 30* 29*  BILITOT 0.9 0.7  PROT 7.2 6.9  ALBUMIN 3.7 3.5   CBG: No results for input(s): GLUCAP in the last 168 hours.  Discharge time spent: 34 minutes.  Signed: Travarius Lange, DO [facility] 03/06/2024

## 2024-03-06 NOTE — Progress Notes (Addendum)
 IVT consult for accessing/deaccessing R chest PAC. R chest PAC assessed with big band aids on top of the transparent dressing coming off. HPN needle almost out. Per patient, port needle has been there for a long time. Educated patient that R chest port needle should be changed once a week to prevent infection.R chest port de accessed and re accessed with a new needle 20G x 1 with GBR. RN made aware.

## 2024-03-06 NOTE — Progress Notes (Addendum)
 PT Cancellation Note  Patient Details Name: Tracy Jennings MRN: [id number] DOB: 1956/02/09   Cancelled Treatment:    Reason Eval/Treat Not Completed: Medical issues which prohibited therapy Chart reviewed, attempted to see.  Pt having issues with nausea and elevated HR 201/77, then 219/77 this AM.  Will hold and attempt to see when pt is appropriate.   PM update: Pt is now s/p dialysis, continues to be hypotensive - not appropriate for PT this date.  [name], DPT 03/06/2024, 9:58 AM

## 2024-03-06 NOTE — Hospital Course (Addendum)
 Tracy Jennings is a 68 year old female with ESRD on HD MWF, HTN, HLD, DM, PVD, dementia, chronic recurrent pancreatitis, stroke, cervical cancer, HIT, who presents with slurred speech, left facial spasm, left-sided weakness.  Patient had syncopal episode on 6/23 and was seen in the ED.  At that time she was found to have O2 desaturation to 80% due to pulmonary edema on chest x-ray as well as hyperkalemia with a potassium of 6.2.  She had negative head CT during that admission.  She received hemodialysis and then was discharged home.  Her last known normal was 6/24 at 1700.  Upon arrival to the ED patient's confusion had resolved entirely.  Labs in the ED mostly unremarkable, potassium 4.6, creatinine consistent with ESRD.  Blood pressure was found to be 252/79.  Patient was placed in telemetry bed for observation.  Code stroke was called.Blood pressure resolved.  Head CT was without acute intracranial abnormality.  Chronic infarct seen on right parietal lobe and extensive chronic microvascular ischemic changes with remote lacunar infarcts in the right caudate and right thalamus appreciated.  Patient was admitted for workup of presumed TIA.  LDL 96, hemoglobin A1c 5.1%.  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  MRI brain revealed chronic encephalomalacia within the right parietal lobe and advanced diffuse cerebral white matter disease.  Patient underwent carotid Dopplers which revealed elevated velocity within the distal right internal carotid artery suggesting stenosis of 50 to 69% though no significant plaque identified and thus this may be artifactual. Patient underwent hemodialysis on 6/25 but left her session early AMA.she attempted to leave the hospital AMA but during this event began hallucinating and loss capacity.  Psychiatry was consulted and met with the patient on 6/26 and confirms acute delirium, currently the patient does not have capacity to make medical decisions.   She has been started on Seroquel  for presumed Lewy body dementia.  She has had persistent agitation and Seroquel  has been slowly increased. She is currently medically stable and undergoing routine HD while awaiting safe dispo. TOC is consulted.   BP is labile and high.  Family voiced concern about isosorbide  mononitrate.  Nephrology discontinued isosorbide  and possibly add some other medications.  Patient is currently on losartan , Coreg , clonidine , diltiazem , doxazosin  and the spironolactone .  7/6.

## 2024-03-06 NOTE — Progress Notes (Signed)
 Patient passed Yale swallow test

## 2024-03-06 NOTE — Progress Notes (Signed)
*  PRELIMINARY RESULTS* Echocardiogram 2D Echocardiogram has been performed.  Tracy Jennings Tracy Jennings 03/06/2024, 9:09 PM

## 2024-03-06 NOTE — Progress Notes (Signed)
 SLP Cancellation Note  Patient Details Name: Tracy Jennings MRN: [id number] DOB: 11/11/1955   Cancelled treatment:       Reason Eval/Treat Not Completed:  (chart reviewed; consulted NSG and observed pt in conversation w/ MD, family. Pt is wanting to D/C. from the hospital currently.)  Per chart notes, pt has a Baseline of Dementia.  PMH includes: hypertension, diabetes, hyperlipidemia, cervical cancer, recurrent pancreatitis, PVD, and end-stage renal disease on hemodialysis.  Patient presents to the emergency department after experiencing slurred speech, left-sided facial spasms and weakness. Patient admitted for TIA.  Per chart, LKW at 17:00 PM today (6/24). Her sister noted sudden onset of right facial spasm versus and slurred speech at home that lasted for about 2-3 minutes and then resolved. Per her sister, pt seems to have left-sided weakness. Initially pt had confusion which has resolved. When I saw patient in ED, patient is alert, oriented x 3. Her symptoms have resolved. No slurred speech, facial droop..   As pt is communicating w/ the MD/NSG indicating her wants/needs verbally and adequately, no further Acute ST services indicated at this time. In setting of pt's Baseline Dementia, would recommend monitoring once she returns to her Known environment to determine if any change in her communication ADLs there, and if so, recommend f/u w/ PCP and referral for OP ST services then. NSG will f/u w/ pt/family re: this as pt is wanting to D/C currently.      Tracy Portugal, MS, CCC-SLP Speech Language Pathologist Rehab Services; [facility] [phone] (ascom) Tracy Jennings 03/06/2024, 6:07 PM

## 2024-03-06 NOTE — Plan of Care (Signed)
  Problem: Education: Goal: Knowledge of disease or condition will improve Outcome: Progressing   Problem: Education: Goal: Knowledge of patient specific risk factors will improve (DELETE if not current risk factor) Outcome: Progressing   Problem: Ischemic Stroke/TIA Tissue Perfusion: Goal: Complications of ischemic stroke/TIA will be minimized Outcome: Progressing   Problem: Self-Care: Goal: Ability to participate in self-care as condition permits will improve Outcome: Progressing   Problem: Clinical Measurements: Goal: Ability to maintain clinical measurements within normal limits will improve Outcome: Progressing

## 2024-03-06 NOTE — Progress Notes (Signed)
 Pt came back from Dialysis  ready to leave the hospital. Attending Physician was called to asses the Pt and made her aware of risk from leaving still while her BP was over 200. Pt signature want legible. Pt is not able to  make logical safe decisions regarding her health. AC made aware

## 2024-03-06 NOTE — Progress Notes (Signed)
   03/06/24 1538  Vitals  Temp 98.1 F (36.7 C)  Pulse Rate 73  Resp 18  BP (!) 181/80  SpO2 100 %  O2 Device Room Air  Oxygen Therapy  Patient Activity (if Appropriate) In bed  Pulse Oximetry Type Continuous  Oximetry Probe Site Changed No  Post Treatment  Dialyzer Clearance Lightly streaked  Liters Processed 61.6  Fluid Removed (mL) -100 mL  Tolerated HD Treatment Yes  AVG/AVF Arterial Site Held (minutes) 5 minutes  AVG/AVF Venous Site Held (minutes) 5 minutes   Received patient in bed to unit.  Alert and oriented.  Informed consent signed and in chart.   TX duration: Two hours and thirty-four minutes  Pt AMA with 56 minutes of treatment remaining, NP-Breeze notified of AMA. Pt signed AMA form.  Transported back to the room  Alert, without acute distress.  Hand-off given to patient's nurse.   Access used: Right upper arm fistula Access issues: None  Total UF removed: - Medication(s) given: 25mg  carvedilol  PO, 4mg  doxazosin  mesylate PO

## 2024-03-06 NOTE — Progress Notes (Signed)
 OT Cancellation Note  Patient Details Name: Tracy Jennings MRN: [id number] DOB: [birth year]   Cancelled Treatment:    Reason Eval/Treat Not Completed: Other (comment) (pt is OTF for HD, OT will reattempt as able)  Therisa Sheffield, OTD OTR/L  03/06/24, 1:21 PM

## 2024-03-07 ENCOUNTER — Observation Stay

## 2024-03-07 DIAGNOSIS — W19XXXA Unspecified fall, initial encounter: Secondary | ICD-10-CM | POA: Diagnosis present

## 2024-03-07 DIAGNOSIS — R569 Unspecified convulsions: Secondary | ICD-10-CM

## 2024-03-07 DIAGNOSIS — G309 Alzheimer's disease, unspecified: Secondary | ICD-10-CM

## 2024-03-07 DIAGNOSIS — N19 Unspecified kidney failure: Secondary | ICD-10-CM | POA: Diagnosis not present

## 2024-03-07 DIAGNOSIS — N390 Urinary tract infection, site not specified: Secondary | ICD-10-CM | POA: Diagnosis not present

## 2024-03-07 DIAGNOSIS — I120 Hypertensive chronic kidney disease with stage 5 chronic kidney disease or end stage renal disease: Secondary | ICD-10-CM | POA: Diagnosis present

## 2024-03-07 DIAGNOSIS — G4089 Other seizures: Secondary | ICD-10-CM

## 2024-03-07 DIAGNOSIS — G9341 Metabolic encephalopathy: Secondary | ICD-10-CM | POA: Diagnosis present

## 2024-03-07 DIAGNOSIS — I358 Other nonrheumatic aortic valve disorders: Secondary | ICD-10-CM | POA: Diagnosis present

## 2024-03-07 DIAGNOSIS — N183 Chronic kidney disease, stage 3 unspecified: Secondary | ICD-10-CM

## 2024-03-07 DIAGNOSIS — E871 Hypo-osmolality and hyponatremia: Secondary | ICD-10-CM | POA: Diagnosis not present

## 2024-03-07 DIAGNOSIS — I2489 Other forms of acute ischemic heart disease: Secondary | ICD-10-CM | POA: Diagnosis present

## 2024-03-07 DIAGNOSIS — J9601 Acute respiratory failure with hypoxia: Secondary | ICD-10-CM | POA: Diagnosis present

## 2024-03-07 DIAGNOSIS — G3183 Dementia with Lewy bodies: Secondary | ICD-10-CM | POA: Diagnosis present

## 2024-03-07 DIAGNOSIS — F03918 Unspecified dementia, unspecified severity, with other behavioral disturbance: Secondary | ICD-10-CM | POA: Diagnosis not present

## 2024-03-07 DIAGNOSIS — E43 Unspecified severe protein-calorie malnutrition: Secondary | ICD-10-CM | POA: Diagnosis present

## 2024-03-07 DIAGNOSIS — I5A Non-ischemic myocardial injury (non-traumatic): Secondary | ICD-10-CM | POA: Diagnosis not present

## 2024-03-07 DIAGNOSIS — E1122 Type 2 diabetes mellitus with diabetic chronic kidney disease: Secondary | ICD-10-CM | POA: Diagnosis present

## 2024-03-07 DIAGNOSIS — I2721 Secondary pulmonary arterial hypertension: Secondary | ICD-10-CM | POA: Diagnosis present

## 2024-03-07 DIAGNOSIS — I10 Essential (primary) hypertension: Secondary | ICD-10-CM | POA: Diagnosis not present

## 2024-03-07 DIAGNOSIS — F05 Delirium due to known physiological condition: Secondary | ICD-10-CM

## 2024-03-07 DIAGNOSIS — R443 Hallucinations, unspecified: Secondary | ICD-10-CM | POA: Diagnosis present

## 2024-03-07 DIAGNOSIS — I160 Hypertensive urgency: Secondary | ICD-10-CM | POA: Diagnosis not present

## 2024-03-07 DIAGNOSIS — Z992 Dependence on renal dialysis: Secondary | ICD-10-CM | POA: Diagnosis not present

## 2024-03-07 DIAGNOSIS — G459 Transient cerebral ischemic attack, unspecified: Secondary | ICD-10-CM | POA: Diagnosis present

## 2024-03-07 DIAGNOSIS — Z681 Body mass index (BMI) 19 or less, adult: Secondary | ICD-10-CM | POA: Diagnosis not present

## 2024-03-07 DIAGNOSIS — D75829 Heparin-induced thrombocytopenia, unspecified: Secondary | ICD-10-CM | POA: Diagnosis present

## 2024-03-07 DIAGNOSIS — J811 Chronic pulmonary edema: Secondary | ICD-10-CM | POA: Diagnosis present

## 2024-03-07 DIAGNOSIS — Z5181 Encounter for therapeutic drug level monitoring: Secondary | ICD-10-CM | POA: Diagnosis not present

## 2024-03-07 DIAGNOSIS — D631 Anemia in chronic kidney disease: Secondary | ICD-10-CM | POA: Diagnosis present

## 2024-03-07 DIAGNOSIS — I651 Occlusion and stenosis of basilar artery: Secondary | ICD-10-CM | POA: Diagnosis present

## 2024-03-07 DIAGNOSIS — F0280 Dementia in other diseases classified elsewhere without behavioral disturbance: Secondary | ICD-10-CM | POA: Diagnosis not present

## 2024-03-07 DIAGNOSIS — Z8616 Personal history of COVID-19: Secondary | ICD-10-CM | POA: Diagnosis not present

## 2024-03-07 DIAGNOSIS — F0284 Dementia in other diseases classified elsewhere, unspecified severity, with anxiety: Secondary | ICD-10-CM | POA: Diagnosis present

## 2024-03-07 DIAGNOSIS — N2581 Secondary hyperparathyroidism of renal origin: Secondary | ICD-10-CM | POA: Diagnosis present

## 2024-03-07 DIAGNOSIS — E1151 Type 2 diabetes mellitus with diabetic peripheral angiopathy without gangrene: Secondary | ICD-10-CM | POA: Diagnosis present

## 2024-03-07 DIAGNOSIS — R410 Disorientation, unspecified: Secondary | ICD-10-CM | POA: Diagnosis not present

## 2024-03-07 DIAGNOSIS — F02818 Dementia in other diseases classified elsewhere, unspecified severity, with other behavioral disturbance: Secondary | ICD-10-CM | POA: Diagnosis present

## 2024-03-07 DIAGNOSIS — N186 End stage renal disease: Secondary | ICD-10-CM | POA: Diagnosis not present

## 2024-03-07 LAB — ECHOCARDIOGRAM COMPLETE
AR max vel: 0.9 cm2
AV Area VTI: 0.88 cm2
AV Area mean vel: 0.84 cm2
AV Mean grad: 16.5 mmHg
AV Peak grad: 31.8 mmHg
Ao pk vel: 2.82 m/s
Area-P 1/2: 3.23 cm2
Height: 63 in
MV VTI: 1.15 cm2
P 1/2 time: 485 ms
S' Lateral: 2.5 cm
Weight: 1918.88 [oz_av]

## 2024-03-07 LAB — GLUCOSE, CAPILLARY: Glucose-Capillary: 112 mg/dL — ABNORMAL HIGH (ref 70–99)

## 2024-03-07 MED ORDER — QUETIAPINE FUMARATE 25 MG PO TABS
12.5000 mg | ORAL_TABLET | Freq: Three times a day (TID) | ORAL | Status: DC
  Administered 2024-03-07: 12.5 mg via ORAL
  Filled 2024-03-07: qty 1

## 2024-03-07 MED ORDER — CLONIDINE HCL 0.1 MG PO TABS
0.3000 mg | ORAL_TABLET | Freq: Three times a day (TID) | ORAL | Status: DC
  Administered 2024-03-07 – 2024-03-17 (×29): 0.3 mg via ORAL
  Filled 2024-03-07 (×30): qty 3

## 2024-03-07 MED ORDER — QUETIAPINE FUMARATE 25 MG PO TABS
12.5000 mg | ORAL_TABLET | Freq: Three times a day (TID) | ORAL | Status: DC
  Administered 2024-03-07 – 2024-03-09 (×4): 12.5 mg via ORAL
  Filled 2024-03-07 (×4): qty 1

## 2024-03-07 NOTE — Progress Notes (Signed)
 NEUROLOGY CONSULT FOLLOW UP NOTE   Date of service: March 07, 2024 Patient Name: Tracy Jennings MRN:  [id number] DOB:  July 30, 1956  Interval Hx/subjective  The patient has been having formed visual hallucinations of children. Actively hallucinating during interview today. She describes two young black girls who appear on her periphery from time to time; she can see them playing. She states that she knows that the hallucinations are not real. They are silent and in full color. They are not distressing to the patient.   Vitals   Vitals:   03/07/24 0403 03/07/24 0424 03/07/24 0724 03/07/24 1012  BP: (!) 118/51 (!) 159/71 (!) 185/66 (!) 185/64  Pulse: 63 62 64   Resp: 19  18   Temp: (!) 97.5 F (36.4 C) 98 F (36.7 C)    TempSrc: Oral Oral    SpO2: 100% 95% 97%   Weight:      Height:         Body mass index is 21.24 kg/m.  Physical Exam   Constitutional: Appears well-developed and well-nourished.  Psych: Mildly flattened affect.  Eyes: No scleral injection.  HENT: No OP obstrucion.  Head: Normocephalic.  Respiratory: Effort normal, non-labored breathing.    Neurologic Examination   Mental Status: Awake and alert. Fully oriented to place and time. Thought content appropriate. Speech fluent without evidence of aphasia.  Able to follow all commands without difficulty. Actively visually hallucinating during exam; she states that she knows the hallucinations, which are of two young black girls playing, are not real.  Cranial Nerves: II: Temporal visual fields intact, but positive for extinction on the left to DSS.  III,IV, VI: No ptosis. EOMI, but has a right gaze preference with hesitancy when tracking to the left. No nystagmus.  VII: Smile symmetric VIII: Hearing intact to voice IX,X: No hypophonia or hoarseness XI: Symmetric XII: Midline tongue extension Motor: RUE: 5/5 LUE: 5/5 RLE: 5/5 LLE: 5/5 Sensory: FT intact x 4. No extinction to DSS. Cerebellar: No ataxia with  FNF bilaterally Gait: Deferred   Medications  Current Facility-Administered Medications:    acetaminophen  (TYLENOL ) tablet 650 mg, 650 mg, Oral, Q4H PRN **OR** acetaminophen  (TYLENOL ) 160 MG/5ML solution 650 mg, 650 mg, Per Tube, Q4H PRN **OR** acetaminophen  (TYLENOL ) suppository 650 mg, 650 mg, Rectal, Q4H PRN, Niu, Xilin, MD   aspirin  suppository 300 mg, 300 mg, Rectal, Daily **OR** aspirin  tablet 325 mg, 325 mg, Oral, Daily, Niu, Xilin, MD, 325 mg at 03/07/24 0945   atorvastatin  (LIPITOR) tablet 40 mg, 40 mg, Oral, Daily, Niu, Xilin, MD, 40 mg at 03/07/24 0944   carvedilol  (COREG ) tablet 25 mg, 25 mg, Oral, BID WC, Breeze, Shantelle, NP, 25 mg at 03/07/24 0944   Chlorhexidine  Gluconate Cloth 2 % PADS 6 each, 6 each, Topical, Daily, Dezii, Alexandra, DO, 6 each at 03/06/24 0910   Chlorhexidine  Gluconate Cloth 2 % PADS 6 each, 6 each, Topical, Q0600, Breeze, Shantelle, NP, 6 each at 03/07/24 0557   cloNIDine  (CATAPRES ) tablet 0.3 mg, 0.3 mg, Oral, TID, Dezii, Alexandra, DO, 0.3 mg at 03/07/24 0944   diltiazem  (CARDIZEM  CD) 24 hr capsule 120 mg, 120 mg, Oral, Daily, Breeze, Shantelle, NP, 120 mg at 03/07/24 0945   doxazosin  (CARDURA ) tablet 4 mg, 4 mg, Oral, Daily, Breeze, Shantelle, NP, 4 mg at 03/07/24 0945   enalaprilat  (VASOTEC) injection 0.625 mg, 0.625 mg, Intravenous, Q3H PRN, Niu, Xilin, MD   haloperidol  lactate (HALDOL ) injection 2 mg, 2 mg, Intravenous, Q6H PRN, Dezii, Alexandra, DO  isosorbide  mononitrate (IMDUR ) 24 hr tablet 60 mg, 60 mg, Oral, Daily, Breeze, Shantelle, NP, 60 mg at 03/07/24 0944   labetalol  (NORMODYNE ) injection 10 mg, 10 mg, Intravenous, Q2H PRN, Dezii, Alexandra, DO, 10 mg at 03/06/24 9148   LORazepam  (ATIVAN ) injection 2 mg, 2 mg, Intravenous, Q2H PRN, Niu, Xilin, MD   losartan  (COZAAR ) tablet 100 mg, 100 mg, Oral, Daily, Breeze, Shantelle, NP, 100 mg at 03/06/24 1901   ondansetron  (ZOFRAN ) injection 4 mg, 4 mg, Intravenous, Q8H PRN, Niu, Xilin, MD, 4 mg at  03/06/24 0810   QUEtiapine (SEROQUEL) tablet 12.5 mg, 12.5 mg, Oral, TID, Jadapalle, Sree, MD   senna-docusate (Senokot-S) tablet 1 tablet, 1 tablet, Oral, QHS PRN, Niu, Xilin, MD   sodium chloride  flush (NS) 0.9 % injection 10-40 mL, 10-40 mL, Intracatheter, PRN, Leesa Kast, DO  Labs and Diagnostic Imaging   CBC:  Recent Labs  Lab 03/04/24 1315 03/05/24 1810  WBC 5.6 4.3  NEUTROABS 4.4 2.8  HGB 10.8* 10.9*  HCT 34.0* 33.9*  MCV 94.2 94.2  PLT 129* 150    Basic Metabolic Panel:  Lab Results  Component Value Date   NA 135 03/05/2024   K 4.6 03/05/2024   CO2 26 03/05/2024   GLUCOSE 100 (H) 03/05/2024   BUN 47 (H) 03/05/2024   CREATININE 7.33 (H) 03/05/2024   CALCIUM  9.2 03/05/2024   GFRNONAA 6 (L) 03/05/2024   GFRAA 3 (L) 06/15/2020   Lipid Panel:  Lab Results  Component Value Date   LDLCALC 96 03/06/2024   HgbA1c:  Lab Results  Component Value Date   HGBA1C 5.1 03/05/2024   Urine Drug Screen: No results found for: LABOPIA, COCAINSCRNUR, LABBENZ, AMPHETMU, THCU, LABBARB  Alcohol Level     Component Value Date/Time   [facility] <15 03/05/2024 1810   INR  Lab Results  Component Value Date   INR 1.3 (H) 03/05/2024   APTT  Lab Results  Component Value Date   APTT 36 03/05/2024   TTE: 1. Left ventricular ejection fraction, by estimation, is 55 to 60%. The  left ventricle has normal function. The left ventricle has no regional  wall motion abnormalities. There is severe left ventricular hypertrophy.  Left ventricular diastolic parameters  are consistent with Grade II diastolic  dysfunction (pseudonormalization).   2. Right ventricular systolic function is normal. The right ventricular  size is normal. There is moderately elevated pulmonary artery systolic  pressure. The estimated right ventricular systolic pressure is 46.2 mmHg.   3. The mitral valve is normal in structure. No evidence of mitral valve  regurgitation. Mild mitral stenosis. The  mean mitral valve gradient is 5.0  mmHg. Moderate mitral annular calcification.   4. Tricuspid valve regurgitation is mild to moderate.   5. The aortic valve is calcified. There is moderate calcification of the  aortic valve. Aortic valve regurgitation is mild to moderate. Mild to  moderate aortic valve stenosis. Aortic valve mean gradient measures 16.5  mmHg.   6. The inferior vena cava is normal in size with greater than 50%  respiratory variability, suggesting right atrial pressure of 3 mmHg.   Assessment  68 y.o. female with a PMHx of prior right hemispheric CVA, anemia, ESRD on HD, cervical cancer, DM2, HLD, HTN, PVD and pancreatitis who presents to the ED from home via EMS as a Code Stroke. LKN was 1700. Her sister noted sudden onset of right facial spasm versus and slurred speech at home that lasted for about 2-3 minutes and then resolved,  prompting the call to EMS. Sister also felt that the patient was weak on the left. On EMS arrival, CBG was 110. She was afebrile with BP 121/54. Cardiac monitor revealed NSR with PVCs and a rate of 76. On EMS assessment, she was confused with inability to follow commands and left sided weakness. Weakness is now resolved and she is fully oriented today, but now with formed visual hallucinations.  - Exam reveals normal strength x 4, but she is slightly less attentive to stimuli on her left side than her right, consistent with her chronic right parietal lobe infarct. Actively hallucinating during exam (see interval history at the top of this note).  - Imaging:  - CT head: No CT evidence of acute intracranial abnormality. 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 Extensive chronic microvascular ischemic changes with remote lacunar infarcts in the right caudate and right thalamus. ASPECTS is 10 - MRI brain: Chronic encephalomalacia within the right parietal lobe and moderately advanced diffuse cerebral white matter disease.  - MRA head: Limited study  secondary to patient motion. 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  - Carotid ultrasound: Right carotid system: An elevated velocity is measured within the distal right internal carotid artery which suggests a stenosis between 50 and 69%. There is no significant plaque identified and therefore this may be artifactual. Left carotid system: No evidence of significant flow limiting stenosis. - TTE unremarkable from a neurological standpoint - Labs on admission: BUN was elevated at 47. Cr elevated at 7.33 with eGFR of 6. LFTs were normal. Na, K and Ca were normal - HgbA1c is normal. Lipid panel normal  - EKG: Right and left arm electrode reversal, interpretation assumes no reversal; Ectopic atrial rhythm; Probable left atrial enlargement consider left ventricular hypertrophy; abnormal T, consider ischemia, diffuse leads; ST elevation, consider anterior injury - EEG: Continuous slow,  right temporo-parietal region. This study is suggestive of cortical dysfunction arising from right temporo-parietal region likely secondary to underlying structural abnormality. No seizures or epileptiform discharges were seen throughout the recording. - Impression:  - DDx for the patient's presentation includes TIA with symptoms referable to her left hemisphere, versus a partial complex seizure. As EEG is negative for epileptiform abnormality, the DDx leans more towards TIA and no anticonvulsant is indicated.  - Formed visual hallucinations are suggestive of an underlying degenerative dementia. Lewy body dementia is felt to be more likely than Alzheimer's disease.   Recommendations  - Discontinue PRN Haldol . Avoid potent and typical antipsychotics, as these can exacerbate cognitive impairments in patients with Lewy body dementia, which is on her DDx.  - Agree with Seroquel for hallucinations, as recommended by Psychiatry - Cardiac telemetry - PT/OT/Speech - Continue ASA 81 mg po every day.   - Continue atorvastatin  - BP management per standard protocol. Out of the permissive HTN time window.  - Risk factor modification - Frequent neuro checks - Neurohospitalist service will sign off. Please call if there are additional questions.  - Will need outpatient Neurology follow up.   ______________________________________________________________________   Bonney SHARK, Comfort Iversen, MD [facility]

## 2024-03-07 NOTE — TOC Initial Note (Signed)
 Transition of Care [facility]) - Initial/Assessment Note    Patient Details  Name: Tracy Jennings MRN: [id number] Date of Birth: [birth year]  [facility]) CM/SW Contact:    Dalia GORMAN Fuse, RN Phone Number: 03/07/2024, 4:11 PM  Clinical Narrative:                  TOC spoke with the patient's sister Dionisio [phone]. The patient is from home independently. She occasionally uses a RW to amb. The plan is for the patient to go [facility] with [facility] PT/OT vs SNF. The patient's sister did not have a preference for SNF. TOC advised the FL2 will be sent out in [place] and the bed offers will be shared with the family to decide. Dionisio advised that ultimately her niece should make the decision.  TOC will continue to follow.   Expected Discharge Plan: Skilled Nursing Facility Barriers to Discharge: Continued Medical Work up   Patient Goals and CMS Choice            Expected Discharge Plan and Services   Discharge Planning Services: CM Consult   Living arrangements for the past 2 months: Single Family Home                                      Prior Living Arrangements/Services Living arrangements for the past 2 months: Single Family Home Lives with:: Self              Current home services: DME (walker)    Activities of Daily Living   ADL Screening (condition at time of admission) Independently performs ADLs?: Yes (appropriate for developmental age) Is the patient deaf or have difficulty hearing?: No Does the patient have difficulty seeing, even when wearing glasses/contacts?: No Does the patient have difficulty concentrating, remembering, or making decisions?: No  Permission Sought/Granted                  Emotional Assessment              Admission diagnosis:  TIA (transient ischemic attack) [G45.9] Hallucination [R44.3] Patient Active Problem List   Diagnosis Date Noted   Hallucination 03/07/2024   TIA (transient ischemic attack)  03/05/2024   Type II diabetes mellitus with renal manifestations (HCC) 03/05/2024   Anemia in ESRD (end-stage renal disease) (HCC) 03/05/2024   Myocardial injury 03/05/2024   Heart murmur, systolic 04/13/2022   Dementia with behavioral disturbance (HCC) 03/29/2022   Headache 03/29/2022   Hypertensive urgency 03/29/2022   Hypertensive emergency 03/28/2022   EKG, abnormal 03/28/2022   History of anesthesia reaction 07/06/2020   Failure to thrive in adult    Pressure injury of skin 06/27/2020   Thrush    Anemia of chronic disease    Palliative care by specialist    Generalized abdominal pain    Acute blood loss anemia    Intractable vomiting 06/21/2020   Hypokalemia 06/21/2020   Hyponatremia 06/21/2020   Hypomagnesemia 06/21/2020   Intractable nausea and vomiting 06/21/2020   Nausea vomiting and diarrhea 06/21/2020   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   Goals of care, counseling/discussion 03/20/2020   Cervical cancer (HCC) 03/11/2020   Orthostatic hypotension    Syncope 11/04/2019   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   Type 2 diabetes mellitus with other specified complication (HCC) 11/03/2019   Unable to care for self 11/03/2019   Accelerated hypertension 11/03/2019  Metabolic acidosis, increased anion gap (IAG)    Generalized weakness    Recurrent syncope 10/28/2019   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 [place] [address] Phone: [phone] Fax: [phone]     Social Drivers of Health (SDOH) Social History: SDOH Screenings   Food Insecurity: Food  Insecurity Present (03/06/2024)  Housing: Low Risk  (03/06/2024)  Transportation Needs: No Transportation Needs (03/06/2024)  Utilities: Not At Risk (03/06/2024)  Financial Resource Strain: Low Risk  (01/09/2024)   Received from [facility]  Social Connections: Unknown (03/06/2024)  Tobacco Use: Low Risk  (03/06/2024)   SDOH Interventions:     Readmission Risk Interventions     No data to display

## 2024-03-07 NOTE — Care Management Obs Status (Signed)
 MEDICARE OBSERVATION STATUS NOTIFICATION   Patient Details  Name: Tracy Jennings MRN: [id number] Date of Birth: June 06, 1956   Medicare Observation Status Notification Given:   patient is currently in a deep sleep, not waking.    Rojelio SHAUNNA Rattler 03/07/2024, 8:50 AM

## 2024-03-07 NOTE — Consult Note (Signed)
 Tracy [facility] - [facility] Psychiatric Consult Initial  Patient Name: .[name]  MRN: [id number]  DOB: [birth year]  Consult Order details:  Orders (From admission, onward)     Start     Ordered   03/07/24 0935  IP CONSULT TO PSYCHIATRY       Ordering Provider: Leesa Kast, DO  Provider:  (Not yet assigned)  Question Answer Comment  Location [facility]   Reason for Consult? Capacity eval to leave [facility]      03/07/24 0934             Mode of Visit: In person    Psychiatry Consult Evaluation  Service Date: March 07, 2024 LOS:  LOS: 0 days  Chief Complaint Capacity eval  Primary Psychiatric Diagnoses  Delirium due to multiple etiologies- uremia 2.   3.    Assessment  Tracy Jennings is a 68 y.o. female admitted: Medicallyfor 03/05/2024  5:50 PM  with ESRD on HD MWF, HTN, HLD, DM, PVD, dementia, chronic recurrent pancreatitis, stroke, cervical cancer, HIT, who presents with slurred speech, left facial spasm, left-sided weakness.  Patient had syncopal episode on 6/23 and was seen in the ED.  At that time she was found to have O2 desaturation to 80% due to pulmonary edema on chest x-ray as well as hyperkalemia with a potassium of 6.2.  She had negative head CT during that admission.  She received hemodialysis and then was discharged home.  Her last known normal was 6/24 at 1700.  Upon arrival to the ED patient's confusion had resolved entirely.  Labs in the ED mostly unremarkable, potassium 4.6, creatinine consistent with ESRD.  Blood pressure was found to be 252/79.  Patient was placed in telemetry bed for observation.  Code stroke was called.Blood pressure resolved.  Head CT was without acute intracranial abnormality.  Chronic infarct seen on right parietal lobe and extensive chronic microvascular ischemic changes with remote lacunar infarcts in the right caudate and right thalamus appreciated.  Patient was admitted for workup of presumed TIA.  LDL 96, hemoglobin A1c  5.1%.  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  MRI brain revealed chronic encephalomalacia within the right parietal lobe and advanced diffuse cerebral white matter disease.  Patient underwent carotid Dopplers which revealed elevated velocity within the distal right internal carotid artery suggesting stenosis of 50 to 69% though no significant plaque identified and thus this may be artifactual. Patient underwent hemodialysis on 6/25 but left her session early AMA.  When she returned to the room she reported that she wanted to leave AGAINST MEDICAL ADVICE. Family expressed significant concerns about patient signing AMA and patient reportedly couldn't even sign the paper properly. Psychiatry is consulted to evaluate for capacity due to concerns of delirium.   On assessment patient is noted to be actively hallucinating, displaying visual hallucinations saying she is seeing little children in the room and they are messing with her.  She is noted to be scanning the room throughout the interview.  She is able to answer the orientation questions stating she was told she is at [facility], month as June and the year is 2025.  She is unable to engage in any meaningful conversations due to the visual hallucination and is demanding to leave the hospital as she reports not feeling safe in the hospital.  She is able to acknowledge her multiple medical problems and the need for treatment but because she is not feeling safe in the hospital  she wants to leave AMA.  Given the psychosis it is our clinical opinion that patient lacks capacity to make that medical decision of discontinuing treatment and going home at this time.  Recommend Seroquel  12.5 mg 3 times daily to help with the visual hallucinations in the context of delirium.  Will recommend to monitor for orthostatic hypotension/dizziness/low blood pressure with Seroquel .  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  Given the psychosis, altered mental status patient is unable to make a reasonable decision at this time.  It is our clinical opinion that patient lacks capacity as of now to make the decision of leaving AMA.  Will recommend to reach out to family members for surrogate decision making.  ## Further Work-up:  -- Patient is seen and followed by neurology  -   ## Disposition:-- There are no psychiatric contraindications to discharge at this time  ## Behavioral / Environmental: -Delirium Precautions: Delirium Interventions for Nursing and Staff: - RN to open blinds every AM. - To Bedside: Glasses, hearing aide, and pt's own shoes. Make available to patients. when possible and encourage use. - Encourage po fluids when appropriate, keep fluids within reach. - OOB to chair with meals. - Passive ROM exercises to all extremities with AM & PM care. - RN to assess orientation to person, time and place QAM and PRN. - Recommend extended visitation hours with familiar family/friends as feasible. - Staff to minimize disturbances at night. Turn off television when pt asleep or when not in use.    ## Safety and Observation Level:  - Based on my clinical evaluation, I estimate the patient to be at low risk of self harm in the current setting. - At this time, we  recommend  routine. This decision is based on my review of the chart including patient's history and current presentation, interview of the patient, mental status examination, and consideration of suicide risk including evaluating suicidal ideation, plan, intent, suicidal or self-harm behaviors, risk factors, and protective factors. This judgment is based on our ability to directly address suicide risk, implement suicide prevention strategies, and develop a safety plan while the patient is in the clinical setting. Please contact our team if there is a concern that risk level has changed.  CSSR Risk Category:C-SSRS RISK CATEGORY: No Risk  Suicide Risk Assessment: Patient has following modifiable risk factors for suicide: None identified at this time Patient has following non-modifiable or demographic risk factors for suicide: None identified at this time Patient has the following protective factors against suicide: Supportive family and Cultural, spiritual, or religious beliefs that discourage suicide  Thank you for this consult request. Recommendations have been communicated to the primary team.  We will continue to follow-up at this time.   Tracy Marrin, MD       History of Present Illness  Masae Lukacs Lefevre is a 68  y.o. female admitted: Medicallyfor 03/05/2024  5:50 PM  with ESRD on HD MWF, HTN, HLD, DM, PVD, dementia, chronic recurrent pancreatitis, stroke, cervical cancer, HIT, who presents with slurred speech, left facial spasm, left-sided weakness.  Patient had syncopal episode on 6/23 and was seen in the ED.  At that time she was found to have O2 desaturation to 80% due to pulmonary edema on chest x-ray as well as hyperkalemia with a potassium of 6.2.  She had negative head CT during that admission.  She received hemodialysis and then was discharged home.  Her last known normal was 6/24 at 1700.  Upon arrival to the ED patient's confusion had resolved entirely.  Labs in the ED mostly  unremarkable, potassium 4.6, creatinine consistent with ESRD.  Blood pressure was found to be 252/79.  Patient was placed in telemetry bed for observation.  Code stroke was called.Blood pressure resolved.  Head CT was without acute intracranial abnormality.  Chronic infarct seen on right parietal lobe and extensive chronic microvascular ischemic changes with remote lacunar infarcts in the right caudate and right thalamus appreciated.  Patient was admitted for workup of presumed TIA.  LDL 96, hemoglobin A1c 5.1%.  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  MRI brain revealed chronic encephalomalacia within the right parietal lobe and advanced diffuse cerebral white matter disease.  Patient underwent carotid Dopplers which revealed elevated velocity within the distal right internal carotid artery suggesting stenosis of 50 to 69% though no significant plaque identified and thus this may be artifactual. Patient underwent hemodialysis on 6/25 but left her session early AMA.  When she returned to the room she reported that she wanted to leave AGAINST MEDICAL ADVICE. Family expressed significant concerns about patient signing AMA and patient reportedly couldn't even sign the paper properly. Psychiatry is consulted to evaluate for capacity due to concerns of delirium.  Patient Report:  Patient reports that she was home with her sister and she felt weakness on one side of her face and they were worried about having a stroke.  She is able to identify that she is at [facility] and able to answer the orientation questions as of June 2025.  She is unable to answer any of the open-ended questions and when specifically asked about any problems with heart or lungs or kidneys she is unable to answer.  When provider asked specifically about hemodialysis she was able to recall that she gets it 3 times in a week and is able to explain that her kidneys does not remove the toxins and she  needs dialysis to remove the toxins.  Throughout the interview patient is noted to be looking around and stated that the little children in the room are running around and trying to touch her.  She denies any auditory hallucinations.  She is noted to be scanning the room but is not comfortable in telling the provider everything she is seeing stating that staff is telling her that they are not able to see what she is seeing.  Patient reports not feeling safe being in the building and the need for her to leave the building immediately.  Patient did acknowledge the need for dialysis and the need to have the stroke workup and the need to manage her blood pressure.  Given the psychosis she reports not feeling safe being in the hospital.  Psych ROS:  Depression: Denies feeling hopeless or worthless but reports feeling helpless at times given the multiple medical problems  Anxiety: Denies Mania (lifetime and current): denies Psychosis: (lifetime and current): visual hallucinations    Psychiatric and Social History  Psychiatric History:  Information collected from patient  Prev Dx/Sx: denies Current Psych Provider: none reported Home Meds (current): none reported Previous Med Trials: denies Therapy: denies  Prior Psych Hospitalization: denies  Prior Self Harm: denies Prior Violence: denies  Family Psych History: denies Family Hx suicide: denies  Social History:   Educational Hx: HS Occupational Hx: disability Legal Hx: denies Living Situation: lives by herself but has family support Spiritual Hx: denies Access to weapons/lethal means: denies   Substance History Alcohol: denies  Tobacco: denies Illicit drugs: denies Prescription drug abuse: denies Rehab hx: denies  Exam Findings  Physical Exam: Reviewed and agree with the physical exam findings Vital Signs:  Temp:  [97.4 F (36.3 C)-98.1 F (36.7 C)] 97.4 F (36.3 C) (06/26 1232) Pulse Rate:  [56-86] 56 (06/26 1232) Resp:   [16-21] 18 (06/26 1232) BP: (118-225)/(51-86) 171/62 (06/26 1232) SpO2:  [91 %-100 %] 98 % (06/26 1232) Blood pressure (!) 171/62, pulse (!) 56, temperature (!) 97.4 F (36.3 C), resp. rate 18, height 5' 3 (1.6 m), weight 54.4 kg, SpO2 98%. Body mass index is 21.24 kg/m.    Mental Status Exam: General Appearance: Casual  Orientation:  Other:  to self,month, year  Memory:  Immediate;   Fair Recent;   Poor Remote;   Poor  Concentration:  Concentration: Poor and Attention Span: Poor  Recall:  Fair  Attention  Poor  Eye Contact:  Minimal  Speech:  Normal Rate  Language:  Fair  Volume:  Normal  Mood: fine  Affect:  Appropriate  Thought Process:  Disorganized  Thought Content:  Illogical and Hallucinations: Visual  Suicidal Thoughts:  No  Homicidal Thoughts:  No  Judgement:  Impaired  Insight:  Shallow  Psychomotor Activity:  Normal  Akathisia:  No  Fund of Knowledge:  Fair      Assets:  Communication Skills Desire for Improvement Housing Resilience  Cognition:  Impaired,  Mild  ADL's:  Intact  AIMS (if indicated):        Other History   These have been pulled in through the EMR, reviewed, and updated if appropriate.  Family History:  The patient's family history includes Cancer in her father; Diabetes in her maternal grandfather, maternal grandmother, paternal grandfather, paternal grandmother, and sister; Gout in her mother; Hypertension in her mother; Stroke in her mother.  Medical History: Past Medical History:  Diagnosis Date   Anemia 04/2018   low iron. to be started on supplements   Cervical cancer (HCC)    CKD (chronic kidney disease)    Stage IV   Complication of anesthesia    receceived too much anesthesia, that she was in coma for a couple days    COVID-19 virus detected 10/28/2019   Diabetes mellitus without complication (HCC)    type II   ESRD (end stage renal disease) (HCC)    Heart murmur    followed as a child only   HSIL (high grade  squamous intraepithelial lesion) on Pap smear of cervix    Hyperlipidemia associated with type 2 diabetes mellitus (HCC)    Hypertension    Pancreatitis    Peripheral vascular disease (HCC)     Surgical History: Past Surgical History:  Procedure Laterality Date   AMPUTATION TOE Left 2013   2nd toe. tip of toe (toe nail was infected)   AV FISTULA PLACEMENT Left 05/11/2018  Procedure: ARTERIOVENOUS (AV) FISTULA CREATION;  Surgeon: Jama Cordella MATSU, MD;  Location: [facility];  Service: Vascular;  Laterality: Left;   CATARACT EXTRACTION     CERVICAL CONIZATION W/BX N/A 04/08/2020   Procedure: CONIZATION CERVIX WITH BIOPSY;  Surgeon: Mancil Barter, MD;  Location: [facility];  Service: Gynecology;  Laterality: N/A;   CHOLECYSTECTOMY  2014   COLONOSCOPY     COLONOSCOPY WITH PROPOFOL  N/A 01/10/2018   Procedure: COLONOSCOPY WITH PROPOFOL ;  Surgeon: Toledo, Ladell POUR, MD;  Location: [facility];  Service: Gastroenterology;  Laterality: N/A;   DIALYSIS/PERMA CATHETER INSERTION N/A 05/21/2018   Procedure: DIALYSIS/PERMA CATHETER INSERTION;  Surgeon: Marea Selinda RAMAN, MD;  Location: [facility];  Service: Cardiovascular;  Laterality: N/A;   DIALYSIS/PERMA CATHETER INSERTION N/A 07/14/2020   Procedure: DIALYSIS/PERMA CATHETER INSERTION;  Surgeon: Marea Selinda RAMAN, MD;  Location: [facility];  Service: Cardiovascular;  Laterality: N/A;   DIALYSIS/PERMA CATHETER REMOVAL N/A 04/11/2019   Procedure: DIALYSIS/PERMA CATHETER REMOVAL;  Surgeon: Marea Selinda RAMAN, MD;  Location: [facility];  Service: Cardiovascular;  Laterality: N/A;   EYE SURGERY Bilateral 2018   cataract extractions   IR IMAGING GUIDED PORT INSERTION  06/05/2020   THROMBECTOMY W/ EMBOLECTOMY  05/11/2018   Procedure: THROMBECTOMY ARTERIOVENOUS FISTULA;  Surgeon: Jama Cordella MATSU, MD;  Location: [facility];  Service: Vascular;;   TUBAL LIGATION  1984     Medications:   Current Facility-Administered Medications:     acetaminophen  (TYLENOL ) tablet 650 mg, 650 mg, Oral, Q4H PRN **OR** acetaminophen  (TYLENOL ) 160 MG/5ML solution 650 mg, 650 mg, Per Tube, Q4H PRN **OR** acetaminophen  (TYLENOL ) suppository 650 mg, 650 mg, Rectal, Q4H PRN, Niu, Xilin, MD   aspirin  suppository 300 mg, 300 mg, Rectal, Daily **OR** aspirin  tablet 325 mg, 325 mg, Oral, Daily, Niu, Xilin, MD, 325 mg at 03/07/24 0945   atorvastatin  (LIPITOR) tablet 40 mg, 40 mg, Oral, Daily, Niu, Xilin, MD, 40 mg at 03/07/24 0944   carvedilol  (COREG ) tablet 25 mg, 25 mg, Oral, BID WC, Breeze, Shantelle, NP, 25 mg at 03/07/24 0944   Chlorhexidine  Gluconate Cloth 2 % PADS 6 each, 6 each, Topical, Daily, Dezii, Alexandra, DO, 6 each at 03/06/24 0910   Chlorhexidine  Gluconate Cloth 2 % PADS 6 each, 6 each, Topical, Q0600, Druscilla Bald, NP, 6 each at 03/07/24 0557   cloNIDine  (CATAPRES ) tablet 0.3 mg, 0.3 mg, Oral, TID, Dezii, Alexandra, DO, 0.3 mg at 03/07/24 0944   diltiazem  (CARDIZEM  CD) 24 hr capsule 120 mg, 120 mg, Oral, Daily, Breeze, Shantelle, NP, 120 mg at 03/07/24 0945   doxazosin  (CARDURA ) tablet 4 mg, 4 mg, Oral, Daily, Breeze, Shantelle, NP, 4 mg at 03/07/24 0945   enalaprilat  (VASOTEC) injection 0.625 mg, 0.625 mg, Intravenous, Q3H PRN, Niu, Xilin, MD   haloperidol  lactate (HALDOL ) injection 2 mg, 2 mg, Intravenous, Q6H PRN, Dezii, Alexandra, DO   isosorbide  mononitrate (IMDUR ) 24 hr tablet 60 mg, 60 mg, Oral, Daily, Breeze, Shantelle, NP, 60 mg at 03/07/24 0944   labetalol  (NORMODYNE ) injection 10 mg, 10 mg, Intravenous, Q2H PRN, Dezii, Alexandra, DO, 10 mg at 03/06/24 9148   LORazepam  (ATIVAN ) injection 2 mg, 2 mg, Intravenous, Q2H PRN, Niu, Xilin, MD   losartan  (COZAAR ) tablet 100 mg, 100 mg, Oral, Daily, Breeze, Shantelle, NP, 100 mg at 03/06/24 1901   ondansetron  (ZOFRAN ) injection 4 mg, 4 mg, Intravenous, Q8H PRN, Niu, Xilin, MD, 4 mg at 03/06/24 0810   QUEtiapine  (SEROQUEL ) tablet 12.5 mg, 12.5 mg, Oral, TID, Alyzae Hawkey,  Elif Yonts, MD, 12.5  mg at 03/07/24 1324   senna-docusate (Senokot-S) tablet 1 tablet, 1 tablet, Oral, QHS PRN, Niu, Xilin, MD   sodium chloride  flush (NS) 0.9 % injection 10-40 mL, 10-40 mL, Intracatheter, PRN, Dezii, Alexandra, DO  Allergies: Allergies  Allergen Reactions   Amlodipine  Swelling    Knees down to ankles Knees down to ankles   Hctz [Hydrochlorothiazide]     pancreatitis   Heparin  Other (See Comments)    Hx  HIT,   Pt reports cardiac arrest when given heparin    Lmw Heparin      Reports cardiac arrest when given heparin    Other Other (See Comments)    Reports cardiac arrest when given during surgery Reports cardiac arrest when given during surgery Reports cardiac arrest when given during surgery    Sulfa Antibiotics Rash   Lactose    Dairy Aid [Tilactase]     Runny nose   Hydralazine      Nausea and rash    Castle Lamons, MD

## 2024-03-07 NOTE — Procedures (Signed)
 Patient Name: Tracy Jennings  MRN: [id number]  Epilepsy Attending: Arlin MALVA Krebs  Referring Physician/Provider: Merrianne Locus, MD  Date: 03/07/2024 Duration: 41.54 mins  Patient history: 68yo F with slurred speech, left-sided facial spasms and weakness. EEG to evaluate for seizure  Level of alertness: Awake, asleep  AEDs during EEG study: None  Technical aspects: This EEG study was done with scalp electrodes positioned according to the 10-20 International system of electrode placement. Electrical activity was reviewed with band pass filter of 1-70Hz , sensitivity of 7 uV/mm, display speed of 37mm/sec with a 60Hz  notched filter applied as appropriate. EEG data were recorded continuously and digitally stored.  Video monitoring was available and reviewed as appropriate.  Description: The posterior dominant rhythm consists of 8-9 Hz activity of moderate voltage (25-35 uV) seen predominantly in posterior head regions, symmetric and reactive to eye opening and eye closing. Sleep was characterized by vertex waves, sleep spindles (12 to 14 Hz), maximal frontocentral region. EEG showed continuous 3 to 6 Hz theta-delta slowing  in right temporo-parietal region. Hyperventilation and photic stimulation were not performed.     ABNORMALITY - Continuous slow,  right temporo-parietal region.  IMPRESSION: This study is suggestive of cortical dysfunction arising from right temporo-parietal region likely secondary to underlying structural abnormality. No seizures or epileptiform discharges were seen throughout the recording.  Please note lac of epileptiform activity during interictal EEG does not exclude the diagnosis of epilepsy.   Malley Hauter O Oveda Dadamo

## 2024-03-07 NOTE — [place] FL2 (Signed)
 [place]  MEDICAID FL2 LEVEL OF CARE FORM     IDENTIFICATION  Patient Name: Tracy Jennings Birthdate: 11-12-1955 Sex: female Admission Date (Current Location): 03/05/2024  [facility] and [place] Number:  Chiropodist and Address:  [facility], [address], [place], [place] [address]      Provider Number: [id number]  Attending Physician Name and Address:  Leesa Kast, DO  Relative Name and Phone Number:  Dionisio [phone]    Current Level of Care: Hospital Recommended Level of Care: Skilled Nursing Facility Prior Approval Number:    Date Approved/Denied:   PASRR Number:  [id number]   Discharge Plan: SNF    Current Diagnoses: Patient Active Problem List   Diagnosis Date Noted   Hallucination 03/07/2024   TIA (transient ischemic attack) 03/05/2024   Type II diabetes mellitus with renal manifestations (HCC) 03/05/2024   Anemia in ESRD (end-stage renal disease) (HCC) 03/05/2024   Myocardial injury 03/05/2024   Heart murmur, systolic 04/13/2022   Dementia with behavioral disturbance (HCC) 03/29/2022   Headache 03/29/2022   Hypertensive urgency 03/29/2022   Hypertensive emergency 03/28/2022   EKG, abnormal 03/28/2022   History of anesthesia reaction 07/06/2020   Failure to thrive in adult    Pressure injury of skin 06/27/2020   Thrush    Anemia of chronic disease    Palliative care by specialist    Generalized abdominal pain    Acute blood loss anemia    Intractable vomiting 06/21/2020   Hypokalemia 06/21/2020   Hyponatremia 06/21/2020   Hypomagnesemia 06/21/2020   Intractable nausea and vomiting 06/21/2020   Nausea vomiting and diarrhea 06/21/2020   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   Goals of care, counseling/discussion 03/20/2020   Cervical cancer (HCC) 03/11/2020   Orthostatic hypotension    Syncope 11/04/2019   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   Type 2 diabetes mellitus  with other specified complication (HCC) 11/03/2019   Unable to care for self 11/03/2019   Accelerated hypertension 11/03/2019   Metabolic acidosis, increased anion gap (IAG)    Generalized weakness    Recurrent syncope 10/28/2019   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   Complication of vascular access for dialysis 05/11/2018   Hyperlipidemia 04/12/2018   Chronic recurrent pancreatitis (HCC) 11/01/2017   Essential hypertension 11/01/2017   ESRD (end stage renal disease) (HCC) 11/01/2017   Recurrent pancreatitis 11/01/2017    Orientation RESPIRATION BLADDER Height & Weight     Self, Place    Continent Weight: 54.4 kg Height:  5' 3 (160 cm)  BEHAVIORAL SYMPTOMS/MOOD NEUROLOGICAL BOWEL NUTRITION STATUS      Continent Diet (heart healthy carb)  AMBULATORY STATUS COMMUNICATION OF NEEDS Skin     Verbally (slurred speech)                         Personal Care Assistance Level of Assistance  Feeding, Dressing, Bathing Bathing Assistance: Limited assistance Feeding assistance: Limited assistance Dressing Assistance: Limited assistance     Functional Limitations Info             SPECIAL CARE FACTORS FREQUENCY  PT (By licensed PT), OT (By licensed OT)     PT Frequency: 5 x week OT Frequency: 5 x week            Contractures      Additional Factors Info  Code Status, Allergies Code Status Info: FULL Allergies Info: Amlodipine  Hctz (hydrochlorothiazide), Heparin ,  Lmw Heparin   Sulfa Antibiotics,  Lactose,     Dairy Aid (tilactase),  Hydralazine            Current Medications (03/07/2024):  This is the current hospital active medication list Current Facility-Administered Medications  Medication Dose Route Frequency Provider Last Rate Last Admin   acetaminophen  (TYLENOL ) tablet 650 mg  650 mg Oral Q4H PRN Niu, Xilin, MD       Or   acetaminophen  (TYLENOL ) 160 MG/5ML solution 650 mg  650 mg Per Tube Q4H PRN Niu, Xilin, MD       Or    acetaminophen  (TYLENOL ) suppository 650 mg  650 mg Rectal Q4H PRN Niu, Xilin, MD       aspirin  suppository 300 mg  300 mg Rectal Daily Niu, Xilin, MD       Or   aspirin  tablet 325 mg  325 mg Oral Daily Niu, Xilin, MD   325 mg at 03/07/24 0945   atorvastatin  (LIPITOR) tablet 40 mg  40 mg Oral Daily Niu, Xilin, MD   40 mg at 03/07/24 0944   carvedilol  (COREG ) tablet 25 mg  25 mg Oral BID WC Druscilla Bald, NP   25 mg at 03/07/24 0944   Chlorhexidine  Gluconate Cloth 2 % PADS 6 each  6 each Topical Daily Dezii, Alexandra, DO   6 each at 03/07/24 1434   Chlorhexidine  Gluconate Cloth 2 % PADS 6 each  6 each Topical Q0600 Druscilla Bald, NP   6 each at 03/07/24 0557   cloNIDine  (CATAPRES ) tablet 0.3 mg  0.3 mg Oral TID Dezii, Alexandra, DO   0.3 mg at 03/07/24 0944   diltiazem  (CARDIZEM  CD) 24 hr capsule 120 mg  120 mg Oral Daily Druscilla Bald, NP   120 mg at 03/07/24 0945   doxazosin  (CARDURA ) tablet 4 mg  4 mg Oral Daily Druscilla Bald, NP   4 mg at 03/07/24 0945   enalaprilat  (VASOTEC) injection 0.625 mg  0.625 mg Intravenous Q3H PRN Niu, Xilin, MD       haloperidol  lactate (HALDOL ) injection 2 mg  2 mg Intravenous Q6H PRN Dezii, Alexandra, DO       isosorbide  mononitrate (IMDUR ) 24 hr tablet 60 mg  60 mg Oral Daily Breeze, Bald, NP   60 mg at 03/07/24 0944   labetalol  (NORMODYNE ) injection 10 mg  10 mg Intravenous Q2H PRN Dezii, Alexandra, DO   10 mg at 03/06/24 9148   LORazepam  (ATIVAN ) injection 2 mg  2 mg Intravenous Q2H PRN Niu, Xilin, MD       losartan  (COZAAR ) tablet 100 mg  100 mg Oral Daily Druscilla Bald, NP   100 mg at 03/06/24 1901   ondansetron  (ZOFRAN ) injection 4 mg  4 mg Intravenous Q8H PRN Niu, Xilin, MD   4 mg at 03/06/24 0810   QUEtiapine (SEROQUEL) tablet 12.5 mg  12.5 mg Oral TID Jadapalle, Sree, MD   12.5 mg at 03/07/24 1324   senna-docusate (Senokot-S) tablet 1 tablet  1 tablet Oral QHS PRN Niu, Xilin, MD       sodium chloride  flush (NS) 0.9 % injection  10-40 mL  10-40 mL Intracatheter PRN Dezii, Alexandra, DO         Discharge Medications: Please see discharge summary for a list of discharge medications.  Relevant Imaging Results:  Relevant Lab Results:   Additional Information SSN    [id number]  Dalia GORMAN Fuse, RN

## 2024-03-07 NOTE — Care Management Obs Status (Signed)
 MEDICARE OBSERVATION STATUS NOTIFICATION   Patient Details  Name: KERIANA SARSFIELD MRN: [id number] Date of Birth: September 26, 1955   Medicare Observation Status Notification Given:  No (patient did not want a copy)    Rojelio SHAUNNA Rattler 03/07/2024, 9:32 AM

## 2024-03-07 NOTE — Evaluation (Signed)
 Occupational Therapy Evaluation Patient Details Name: Tracy Jennings MRN: [id number] DOB: 06-11-1956 Today's Date: 03/07/2024   History of Present Illness   Pt is a 68 year old female presents with slurred speech,  left facial spasm, left sided weakness.    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 She is pleasant and participatory but inconsistently follows one step directions and repeatedly asks for therapist to repeat questions/directives. PTA pt reports she was living alone, managing ADLs, had assist for IADLs from her sister. Will need to confirm as pt is a poor historian at this time. Pt presents with deficits in strength, endurance, activity tolerance, balance, cognition, vision, perception, affecting safe and optimal ADL completion. She required CGA-MIN A for bed mobility, STS, amb with RW with close chair follow. Her vision appears impaired at this time, will continue to assess. Anticipate pt will require post acute OT to address functional deficits and to facilitate optimal ADL performance. Pt is left in bedside chair, all needs met. Nurse/tech notified of pt status, OT will continue to follow.   Pt completed SLUMS examination this date scoring 7/30. Of note, it is not within occupational scope of practice to diagnose cognitive impairments, this screen indicates need for further testing. Pt does have a dementia diagnosis but she reports she lives alone and manages her medications. The SLUMS is a 30 point, 11 question screening questionnaire that tests orientation, memory, attention, and executive function. Pt with noted impairments in short term memory, problem solving, and executive function limiting ability to safely perform ADL/IADL tasks.  [employer] Mental  Status Examination Orientation: 2/3  Calculations: 0/3 Naming animals: 2/3 Patient named 10 animals (0 points is 0-4 animals; 1 is 5-9 animals; 2 is 10-14 animals; 3 is 15+ animals) Recall: 0/5  Attention: 1/2 Clock drawing: 0/4 Visual Processing: 0/2 -unable to locate circle on paper to draw clock, unable to locate shapes on page either, vision assessment limited due to current attention status but appears impaired during functional assessment  Paragraph Memory: 2/8  Total: 7/30;       If plan is discharge home, recommend the following:   A little help with walking and/or transfers;A little help with bathing/dressing/bathroom;Assistance with cooking/housework;Direct supervision/assist for medications management;Direct supervision/assist for financial management;Supervision due to cognitive status;Help with stairs or ramp for entrance;Assist for transportation     Functional Status Assessment   Patient has had a recent decline in their functional status and demonstrates the ability to make significant improvements in function in a reasonable and predictable amount of time.     Equipment Recommendations   BSC/3in1     Recommendations for Other Services         Precautions/Restrictions   Precautions Precautions: Fall Recall of Precautions/Restrictions: Impaired Restrictions Weight Bearing Restrictions Per Provider Order: No     Mobility Bed Mobility Overal bed mobility: Needs Assistance Bed Mobility: Supine to Sit     Supine to sit: Contact guard, Used rails          Transfers Overall transfer level: Needs assistance Equipment used: Rolling walker (2 wheels) Transfers: Sit to/from Stand Sit to Stand: Contact guard assist, Min assist                  Balance Overall balance assessment: Needs assistance Sitting-balance support: Feet supported  Sitting balance-Leahy Scale: Good     Standing balance support: Bilateral upper extremity supported,  During functional activity, Reliant on assistive device for balance Standing balance-Leahy Scale: Fair                             ADL either performed or assessed with clinical judgement   ADL Overall ADL's : Needs assistance/impaired Eating/Feeding: Minimal assistance;Sitting;Cueing for sequencing                   Lower Body Dressing: Maximal assistance;Sitting/lateral leans Lower Body Dressing Details (indicate cue type and reason): for L sock, does put R sock on with MIN A, step by step cues Toilet Transfer: Minimal assistance;Rolling walker (2 wheels);Ambulation;Cueing for safety;Cueing for sequencing Toilet Transfer Details (indicate cue type and reason): simulated         Functional mobility during ADLs: Minimal assistance;Rolling walker (2 wheels);Contact guard assist;Cueing for sequencing;Cueing for safety;+2 for safety/equipment (pt with poor spatial awareness, running into items while attempting to amb)       Vision Baseline Vision/History: 1 Wears glasses Patient Visual Report: No change from baseline Vision Assessment?: Yes Tracking/Visual Pursuits: Impaired - to be further tested in functional context Saccades: Impaired - to be further tested in functional context Convergence: Impaired - to be further tested in functional context Visual Fields: Impaired-to be further tested in functional context Additional Comments: due to poor attention, attempts at tracking,saccades, convergence appears impaired, but will need further asesssment functionally. Pt is able to locate items around her tray although it takes significantly increased time     Perception Perception: Impaired Preception Impairment Details: Spatial orientation     Praxis Praxis: Impaired Praxis Impairment Details: Motor planning     Pertinent Vitals/Pain Pain Assessment Pain Assessment: Faces Faces Pain Scale: No hurt     Extremity/Trunk Assessment Upper Extremity  Assessment Upper Extremity Assessment: Generalized weakness (no focal weakness appreciated; will continue to assess)   Lower Extremity Assessment Lower Extremity Assessment: Defer to PT evaluation       Communication Communication Communication: No apparent difficulties   Cognition Arousal: Alert Behavior During Therapy: Flat affect Cognition: No family/caregiver present to determine baseline, History of cognitive impairments, Cognition impaired   Orientation impairments: Time, Place, Situation Awareness: Online awareness impaired, Intellectual awareness impaired Memory impairment (select all impairments): Short-term memory, Declarative long-term memory Attention impairment (select first level of impairment): Focused attention Executive functioning impairment (select all impairments): Initiation, Organization, Sequencing, Reasoning, Problem solving OT - Cognition Comments: Participated in slums, scoring 7/30;                 Following commands: Impaired Following commands impaired: Follows one step commands inconsistently     Cueing  General Comments   Cueing Techniques: Verbal cues;Gestural cues;Tactile cues;Visual cues      Exercises Other Exercises Other Exercises: edu re: role of OT, role of rehab, safe ADL completion   Shoulder Instructions      Home Living Family/patient expects to be discharged to:: Private residence Living Arrangements: Alone Available Help at Discharge: Family;Available PRN/intermittently Type of Home: House Home Access: Stairs to enter [employer] of Steps: 5 Entrance Stairs-Rails: Right;Left Home Layout: One level     Bathroom Shower/Tub: Tub/shower unit         Home Equipment: Educational psychologist (4 wheels)   Additional Comments: will need to confirm PLOF      Prior Functioning/Environment Prior Level of Function : Patient  poor historian/Family not available             Mobility Comments: amb with  rollator ADLs Comments: sister helps w groceries/meds    OT Problem List: Decreased strength;Impaired balance (sitting and/or standing);Decreased cognition;Decreased knowledge of precautions;Impaired vision/perception;Decreased safety awareness;Decreased activity tolerance;Decreased coordination;Decreased knowledge of use of DME or AE   OT Treatment/Interventions: Self-care/ADL training;DME and/or AE instruction;Therapeutic activities;Balance training;Therapeutic exercise;Energy conservation;Patient/family education      OT Goals(Current goals can be found in the care plan section)   Acute Rehab OT Goals Patient Stated Goal: eat OT Goal Formulation: With patient Time For Goal Achievement: 03/20/24 Potential to Achieve Goals: Good ADL Goals Pt Will Perform Grooming: with supervision;sitting;standing Pt Will Perform Lower Body Dressing: with supervision;sitting/lateral leans;sit to/from stand Pt Will Transfer to Toilet: with supervision;ambulating Pt Will Perform Toileting - Clothing Manipulation and hygiene: with supervision;sit to/from stand;sitting/lateral leans Additional ADL Goal #1: Pt will participate in further cognitive assessment (i.e. pill box test) to facilitate improved/safe IADL completion   OT Frequency:  Min 2X/week    Co-evaluation PT/OT/SLP Co-Evaluation/Treatment: Yes Reason for Co-Treatment: To address functional/ADL transfers;Necessary to address cognition/behavior during functional activity;For patient/therapist safety (for portion of eval/mobility for safety)          AM-PAC OT 6 Clicks Daily Activity     Outcome Measure Help from another person eating meals?: A Little Help from another person taking care of personal grooming?: A Little Help from another person toileting, which includes using toliet, bedpan, or urinal?: A Lot Help from another person bathing (including washing, rinsing, drying)?: A Lot Help from another person to put on and taking off  regular upper body clothing?: A Little Help from another person to put on and taking off regular lower body clothing?: A Lot 6 Click Score: 15   End of Session Equipment Utilized During Treatment: Gait belt;Rolling walker (2 wheels) Nurse Communication: Mobility status;Other (comment) (team re: vision concerns, cognitive status)  Activity Tolerance: Patient tolerated treatment well Patient left: in chair;with call bell/phone within reach;with chair alarm set  OT Visit Diagnosis: Other abnormalities of gait and mobility (R26.89);Muscle weakness (generalized) (M62.81);Cognitive communication deficit (R41.841)                Time: 9146-9072 OT Time Calculation (min): 34 min Charges:  OT General Charges $OT Visit: 1 Visit OT Evaluation $OT Eval Moderate Complexity: 1 Mod Therisa Sheffield, OTD OTR/L  03/07/24, 10:20 AM

## 2024-03-07 NOTE — Progress Notes (Signed)
 Eeg done

## 2024-03-07 NOTE — Progress Notes (Signed)
 PROGRESS NOTE    Tracy Jennings  FMW:[id number] DOB: 1956-09-11 DOA: 03/05/2024 PCP: Zachary Idelia LABOR, MD  Chief Complaint  Patient presents with   Code Stroke    Hospital Course:  This patient is a 67 year old female with ESRD on HD MWF, HTN, HLD, DM, PVD, dementia, chronic recurrent pancreatitis, stroke, cervical cancer, HIT, who presents with slurred speech, left facial spasm, left-sided weakness.  Patient had syncopal episode on 6/23 and was seen in the ED.  At that time she was found to have O2 desaturation to 80% due to pulmonary edema on chest x-ray as well as hyperkalemia with a potassium of 6.2.  She had negative head CT during that admission.  She received hemodialysis and then was discharged home.  Her last known normal was 6/24 at 1700.  Upon arrival to the ED patient's confusion had resolved entirely.  Labs in the ED mostly unremarkable, potassium 4.6, creatinine consistent with ESRD.  Blood pressure was found to be 252/79.  Patient was placed in telemetry bed for observation.  Code stroke was called.Blood pressure resolved.  Head CT was without acute intracranial abnormality.  Chronic infarct seen on right parietal lobe and extensive chronic microvascular ischemic changes with remote lacunar infarcts in the right caudate and right thalamus appreciated.  Patient was admitted for workup of presumed TIA.  LDL 96, hemoglobin A1c 5.1%.  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  MRI brain revealed chronic encephalomalacia within the right parietal lobe and advanced diffuse cerebral white matter disease.  Patient underwent carotid Dopplers which revealed elevated velocity within the distal right internal carotid artery suggesting stenosis of 50 to 69% though no significant plaque identified and thus this may be artifactual. Patient underwent hemodialysis on 6/25 but left her session early AMA.  When she returned to the room she reported that she wanted to leave  AGAINST MEDICAL ADVICE.  I had extensive discussion with the patient at bedside.  Also present for this discussion was nurse, Camillia, and patient's 2 sisters.  At the time my evaluation patient was able to demonstrate medical capacity.  She was alert and oriented x 4 and appropriately repeated my concerns believing the hospital. However, when patient attempted to sign AMA paperwork her signature was illegible.  She began getting upset and responding to internal stimuli.  Her confusion increased to the point that it was no longer safe to discharge.  Psychiatry was consulted.  Psychiatry met with the patient on 6/26 and confirms acute delirium, currently the patient does not have capacity to make medical decisions.  She has been started on Seroquel.    Subjective: Patient is acutely confused this morning.  She is describing visual hallucinations of small children in the room.  She is oriented x 4 but requires repetition and significant prompting.  Psychiatry is currently evaluating the patient   Objective: Vitals:   03/07/24 0424 03/07/24 0724 03/07/24 1012 03/07/24 1232  BP: (!) 159/71 (!) 185/66 (!) 185/64 (!) 171/62  Pulse: 62 64  (!) 56  Resp:  18  18  Temp: 98 F (36.7 C)   (!) 97.4 F (36.3 C)  TempSrc: Oral     SpO2: 95% 97%  98%  Weight:      Height:        Intake/Output Summary (Last 24 hours) at 03/07/2024 1358 Last data filed at 03/06/2024 1538 Gross per 24 hour  Intake --  Output -100 ml  Net 100 ml   Filed  Weights   03/06/24 0100 03/06/24 1228  Weight: 56.1 kg 54.4 kg    Examination: General exam: Appears calm and comfortable, NAD  Respiratory system: No work of breathing, symmetric chest wall expansion Cardiovascular system: S1 & S2 heard, RRR.  Gastrointestinal system: Abdomen is nondistended, soft and nontender.  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  Throughout the stay her clinical picture is more consistent with acute delirium likely with underlying dementia. - MRA: Moderate stenosis of proximal basilar artery at 50% - MRI brain: Chronic encephalomalacia with the right parietal lobe and advanced diffuse cerebral white matter disease - Carotid Dopplers: Elevated velocity within the distal right internal carotid artery suggesting stenosis of 50 to 69% though no significant plaque identified and thus may be artifactual. - EEG: Cortical dysfunction arising from right temporal parietal region secondary to underlying structural abnormality.  No seizures or epileptiform discharges. - Echocardiogram: EF 55 to 60%, no regional wall motion abnormalities, severe left ventricular hypertrophy, grade 2 diastolic dysfunction elevated pulmonary arterial systolic pressures.  Calcification of aortic valve - Continue statin, continue aspirin . - Lipids and A1c at goal. - Neurology consulted.  Appreciate recommendations  Dementia with behavioral disturbance - Remote history of dementia.  Does not appear she has had significant outpatient workup.  Thought to be vascular secondary to uncontrolled hypertension - Mentation and capacity changing regularly - Psychiatry consulted, has recommended initiation of Seroquel - Have discussed with neurology who suspects Lewy body dementia is playing a role and recommends avoiding antipsychotics - Please see psychiatry notes for more  details - Presently patient does not have capacity to make medical decisions or leave AMA.  We have significant concerns about her ability to take care of herself at home.  She will likely require placement for safety  Hypertensive urgency - Systolic blood pressure severely elevated above 200.  Have resumed all home meds, have added clonidine  - Continue to titrate meds as able - Patient is not taking medications consistently at home.  Nonadherence making management more difficult.  Pulmonary arterial hypertension Grade 2 diastolic dysfunction - As seen on echocardiogram - Continue with GDMT - Continue with tight blood pressure control - Clinically euvolemic, volume to be removed via dialysis as needed  ESRD - On HD MWF - Nephrology consulted during this admission, continue with dialysis per renal  Elevated troponin - No chest pain, no shortness of breath.  Troponin 41 -> 43. likely secondary to demand ischemia and ESRD - Continue aspirin  and Lipitor  Type 2 diabetes with renal manifestations - Recent hemoglobin A1c 5.1%, well-controlled.  Not currently taking medications - Daily CBG  Anemia of chronic disease, ESRD - Trend CBC. - Hemoglobin currently stable  Hyperlipidemia - Statin  Heparin -induced thrombocytopenia - Avoid heparin  or Lovenox  products for now - SCDs as needed  DVT prophylaxis: SCDs   Code Status: Full Code Disposition:  Inpatient, needs placement. TOC consulted.  Attempted to reach patient's sister, no answer.  Have left  voicemail.  Consultants:    Procedures:    Antimicrobials:  Anti-infectives (From admission, onward)    None       Data Reviewed: I have personally reviewed following labs and imaging studies CBC: Recent Labs  Lab 03/04/24 1315 03/05/24 1810  WBC 5.6 4.3  NEUTROABS 4.4 2.8  HGB 10.8* 10.9*  HCT 34.0* 33.9*  MCV 94.2 94.2  PLT 129* 150   Basic Metabolic Panel: Recent Labs  Lab 03/04/24 1239 03/05/24 1810  NA 137  135  K 6.2* 4.6  CL 100 95*  CO2 23 26  GLUCOSE 80 100*  BUN 72* 47*  CREATININE 9.67* 7.33*  CALCIUM  8.7* 9.2   GFR: Estimated Creatinine Clearance: 6.2 mL/min (A) (by C-G formula based on SCr of 7.33 mg/dL (H)). Liver Function Tests: Recent Labs  Lab 03/04/24 1239 03/05/24 1810  AST 26 23  ALT 16 14  ALKPHOS 30* 29*  BILITOT 0.9 0.7  PROT 7.2 6.9  ALBUMIN 3.7 3.5   CBG: Recent Labs  Lab 03/07/24 0724  GLUCAP 112*    No results found for this or any previous visit (from the past 240 hours).   Radiology Studies: EEG adult Result Date: 03/07/2024 Tracy Arlin KIDD, MD     03/07/2024  1:23 PM Patient Name: Tracy Jennings MRN: [id number] Epilepsy Attending: Arlin Jennings Tracy Referring Physician/Provider: Merrianne Locus, MD Date: 03/07/2024 Duration: 41.54 mins Patient history: 68yo F with slurred speech, left-sided facial spasms and weakness. EEG to evaluate for seizure Level of alertness: Awake, asleep AEDs during EEG study: None Technical aspects: This EEG study was done with scalp electrodes positioned according to the 10-20 International system of electrode placement. Electrical activity was reviewed with band pass filter of 1-70Hz , sensitivity of 7 uV/mm, display speed of 70mm/sec with a 60Hz  notched filter applied as appropriate. EEG data were recorded continuously and digitally stored.  Video monitoring was available and reviewed as appropriate. Description: The posterior dominant rhythm consists of 8-9 Hz activity of moderate voltage (25-35 uV) seen predominantly in posterior head regions, symmetric and reactive to eye opening and eye closing. Sleep was characterized by vertex waves, sleep spindles (12 to 14 Hz), maximal frontocentral region. EEG showed continuous 3 to 6 Hz theta-delta slowing  in right temporo-parietal region. Hyperventilation and photic stimulation were not performed.   ABNORMALITY - Continuous slow,  right temporo-parietal region. IMPRESSION: This study is  suggestive of cortical dysfunction arising from right temporo-parietal region likely secondary to underlying structural abnormality. No seizures or epileptiform discharges were seen throughout the recording. Please note lac of epileptiform activity during interictal EEG does not exclude the diagnosis of epilepsy. Arlin Jennings Tracy   ECHOCARDIOGRAM COMPLETE Result Date: 03/07/2024    ECHOCARDIOGRAM REPORT   Patient Name:   Tracy Jennings Date of Exam: 03/06/2024 Medical Rec #:  [id number]        Height:       63.0 in Accession #:    [id number]       Weight:       119.9 lb Date of Birth:  [birth year]        BSA:          1.556 m Patient Age:    68 years         BP:           177/86 mmHg Patient Gender: F                HR:  56 bpm. Exam Location:  [facility] Procedure: 2D Echo, Cardiac Doppler and Color Doppler (Both Spectral and Color            Flow Doppler were utilized during procedure). Indications:     TIA G45.9  History:         Patient has prior history of Echocardiogram examinations.                  Signs/Symptoms:Murmur.  Sonographer:     Bari Roar Referring Phys:  [id number] XILIN NIU Diagnosing Phys: Evalene Lunger MD  Sonographer Comments: Image acquisition challenging due to uncooperative patient. IMPRESSIONS  1. Left ventricular ejection fraction, by estimation, is 55 to 60%. The left ventricle has normal function. The left ventricle has no regional wall motion abnormalities. There is severe left ventricular hypertrophy. Left ventricular diastolic parameters  are consistent with Grade II diastolic dysfunction (pseudonormalization).  2. Right ventricular systolic function is normal. The right ventricular size is normal. There is moderately elevated pulmonary artery systolic pressure. The estimated right ventricular systolic pressure is 46.2 mmHg.  3. The mitral valve is normal in structure. No evidence of mitral valve regurgitation. Mild mitral stenosis. The mean mitral valve gradient is 5.0 mmHg.  Moderate mitral annular calcification.  4. Tricuspid valve regurgitation is mild to moderate.  5. The aortic valve is calcified. There is moderate calcification of the aortic valve. Aortic valve regurgitation is mild to moderate. Mild to moderate aortic valve stenosis. Aortic valve mean gradient measures 16.5 mmHg.  6. The inferior vena cava is normal in size with greater than 50% respiratory variability, suggesting right atrial pressure of 3 mmHg. FINDINGS  Left Ventricle: Left ventricular ejection fraction, by estimation, is 55 to 60%. The left ventricle has normal function. The left ventricle has no regional wall motion abnormalities. Strain was performed and the global longitudinal strain is indeterminate. The left ventricular internal cavity size was normal in size. There is severe left ventricular hypertrophy. Left ventricular diastolic parameters are consistent with Grade II diastolic dysfunction (pseudonormalization). Right Ventricle: The right ventricular size is normal. No increase in right ventricular wall thickness. Right ventricular systolic function is normal. There is moderately elevated pulmonary artery systolic pressure. The tricuspid regurgitant velocity is 3.21 m/s, and with an assumed right atrial pressure of 5 mmHg, the estimated right ventricular systolic pressure is 46.2 mmHg. Left Atrium: Left atrial size was normal in size. Right Atrium: Right atrial size was normal in size. Pericardium: There is no evidence of pericardial effusion. Mitral Valve: The mitral valve is normal in structure. There is mild calcification of the mitral valve leaflet(s). Moderate mitral annular calcification. No evidence of mitral valve regurgitation. Mild mitral valve stenosis. MV peak gradient, 13.1 mmHg. The mean mitral valve gradient is 5.0 mmHg. Tricuspid Valve: The tricuspid valve is normal in structure. Tricuspid valve regurgitation is mild to moderate. No evidence of tricuspid stenosis. Aortic Valve: The  aortic valve is calcified. There is moderate calcification of the aortic valve. Aortic valve regurgitation is mild to moderate. Aortic regurgitation PHT measures 485 msec. Mild to moderate aortic stenosis is present. Aortic valve mean gradient measures 16.5 mmHg. Aortic valve peak gradient measures 31.8 mmHg. Aortic valve area, by VTI measures 0.88 cm. Pulmonic Valve: The pulmonic valve was normal in structure. Pulmonic valve regurgitation is not visualized. No evidence of pulmonic stenosis. Aorta: The aortic root is normal in size and structure. Venous: The inferior vena cava is normal in size with greater than 50% respiratory variability, suggesting  right atrial pressure of 3 mmHg. IAS/Shunts: No atrial level shunt detected by color flow Doppler. Additional Comments: 3D was performed not requiring image post processing on an independent workstation and was indeterminate. There is a small pleural effusion in the left lateral region.  LEFT VENTRICLE PLAX 2D LVIDd:         3.60 cm   Diastology LVIDs:         2.50 cm   LV e' medial:    4.57 cm/s LV PW:         1.70 cm   LV E/e' medial:  33.7 LV IVS:        1.60 cm   LV e' lateral:   5.00 cm/s LVOT diam:     1.70 cm   LV E/e' lateral: 30.8 LV SV:         52 LV SV Index:   34 LVOT Area:     2.27 cm  RIGHT VENTRICLE RV Basal diam:  3.30 cm RV Mid diam:    2.80 cm RV S prime:     11.00 cm/s TAPSE (M-mode): 2.1 cm LEFT ATRIUM             Index        RIGHT ATRIUM           Index LA diam:        4.00 cm 2.57 cm/m   RA Area:     16.30 cm LA Vol (A2C):   59.0 ml 37.92 ml/m  RA Volume:   42.70 ml  27.44 ml/m LA Vol (A4C):   59.0 ml 37.92 ml/m LA Biplane Vol: 59.0 ml 37.92 ml/m  AORTIC VALVE                     PULMONIC VALVE AV Area (Vmax):    0.90 cm      PV Vmax:          1.04 m/s AV Area (Vmean):   0.84 cm      PV Peak grad:     4.3 mmHg AV Area (VTI):     0.88 cm      PR End Diast Vel: 8.53 msec AV Vmax:           282.00 cm/s   RVOT Peak grad:   2 mmHg AV Vmean:           187.000 cm/s AV VTI:            0.592 m AV Peak Grad:      31.8 mmHg AV Mean Grad:      16.5 mmHg LVOT Vmax:         112.00 cm/s LVOT Vmean:        69.300 cm/s LVOT VTI:          0.230 m LVOT/AV VTI ratio: 0.39 AI PHT:            485 msec  AORTA Ao Root diam: 2.80 cm Ao Asc diam:  2.80 cm MITRAL VALVE                TRICUSPID VALVE MV Area (PHT): 3.23 cm     TR Peak grad:   41.2 mmHg MV Area VTI:   1.15 cm     TR Vmax:        321.00 cm/s MV Peak grad:  13.1 mmHg MV Mean grad:  5.0 mmHg     SHUNTS MV Vmax:       1.81  m/s     Systemic VTI:  0.23 m MV Vmean:      105.0 cm/s   Systemic Diam: 1.70 cm MV Decel Time: 235 msec MV E velocity: 154.00 cm/s MV A velocity: 121.00 cm/s MV E/A ratio:  1.27 MV A Prime:    9.2 cm/s Evalene Lunger MD Electronically signed by Evalene Lunger MD Signature Date/Time: 03/07/2024/12:12:01 PM    Final    US  Carotid Bilateral (at [facility] and [facility] only) Result Date: 03/06/2024 CLINICAL DATA:  Transient ischemic attack EXAM: BILATERAL CAROTID DUPLEX ULTRASOUND TECHNIQUE: Elnor scale imaging, color Doppler and duplex ultrasound were performed of bilateral carotid and vertebral arteries in the neck. COMPARISON:  None Available. FINDINGS: Criteria: Quantification of carotid stenosis is based on velocity parameters that correlate the residual internal carotid diameter with NASCET-based stenosis levels, using the diameter of the distal internal carotid lumen as the denominator for stenosis measurement. The following velocity measurements were obtained: RIGHT ICA: 186 cm/sec CCA: 60 cm/sec SYSTOLIC ICA/CCA RATIO:  3.1 ECA: 112 cm/sec LEFT ICA: 103 cm/sec CCA: 166 cm/sec SYSTOLIC ICA/CCA RATIO:  0.6 ECA: 37 cm/sec RIGHT CAROTID ARTERY: There is no significant plaque in the proximal right internal carotid artery. An elevated velocity is measured in the distal right internal carotid artery. RIGHT VERTEBRAL ARTERY:  Antegrade LEFT CAROTID ARTERY:  There is no significant plaque. LEFT VERTEBRAL  ARTERY:  Antegrade. IMPRESSION: 1. Right carotid system: An elevated velocity is measured within the distal right internal carotid artery which suggests a stenosis between 50 and 69%. There is no significant plaque identified and therefore this may be artifactual. Consideration can be given toward further evaluation by CT angiogram for further characterization if clinically appropriate. 2. Left carotid system: No evidence of significant flow limiting stenosis. Electronically Signed   By: Maude Naegeli M.D.   On: 03/06/2024 12:47   MR ANGIO HEAD WO CONTRAST Result Date: 03/06/2024 CLINICAL DATA:  Stroke/TIA, determine embolic source EXAM: MRA HEAD WITHOUT CONTRAST TECHNIQUE: Angiographic images of the Circle of Willis were acquired using MRA technique without intravenous contrast. COMPARISON:  CT head dated March 05, 2024. FINDINGS: Anterior circulation: Limited secondary to patient motion. Diminutive or stenotic right A1 segment. No obvious aneurysm or large vessel occlusion. Posterior circulation: Limited secondary to motion. Moderate focal stenosis of the proximal basilar artery. Anatomic variants: None. Other: None. IMPRESSION: 1. Limited study secondary to patient motion. 2. 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 Electronically Signed   By: Evalene Coho M.D.   On: 03/06/2024 10:36   MR BRAIN WO CONTRAST Result Date: 03/06/2024 CLINICAL DATA:  Transient ischemic attack (TIA) EXAM: MRI HEAD WITHOUT CONTRAST TECHNIQUE: Multiplanar, multiecho pulse sequences of the brain and surrounding structures were obtained without intravenous contrast. COMPARISON:  CT of the head dated March 05, 2024. FINDINGS: Brain: There is no restricted diffusion to indicate acute or recent infarction. There are encephalomalacia changes within the right parietal lobe. A chronic lacunar infarct is noted within the right caudate nucleus. There is moderate diffuse cerebral white matter disease present.  There is no evidence of hemorrhage, mass, acute cortical infarct or hydrocephalus. Vascular: Normal flow voids. Skull and upper cervical spine: Normal marrow signal. Sinuses/Orbits: Status post bilateral lens replacement. Mild mucosal disease within the maxillary sinuses. Other: None. IMPRESSION: 1. Chronic encephalomalacia within the right parietal lobe and moderately advanced diffuse cerebral white matter disease. Electronically Signed   By: Evalene Coho M.D.   On: 03/06/2024 10:31   CT HEAD CODE  STROKE WO CONTRAST Result Date: 03/05/2024 CLINICAL DATA:  Code stroke.  Neuro deficit, concern for stroke. EXAM: CT HEAD WITHOUT CONTRAST TECHNIQUE: Contiguous axial images were obtained from the base of the skull through the vertex without intravenous contrast. RADIATION DOSE REDUCTION: This exam was performed according to the departmental dose-optimization program which includes automated exposure control, adjustment of the mA and/or kV according to patient size and/or use of iterative reconstruction technique. COMPARISON:  CT head 03/04/2024 and earlier. FINDINGS: Brain: No acute intracranial hemorrhage. No CT evidence of new large territory infarct. Encephalomalacia in the right parietal lobe compatible with remote infarct a similar appearance of associated calcification. Nonspecific hypoattenuation in the periventricular and subcortical white matter favored to reflect chronic microvascular ischemic changes. Additional punctate calcification in the right frontal lobe. Remote lacunar infarcts in the right caudate and right thalamus. Mild parenchymal volume loss. No midline shift. Basilar cisterns are patent. Ventricles: The ventricles are normal. Vascular: Atherosclerotic calcifications of the carotid siphons. No hyperdense vessel. Skull: No acute or aggressive finding. Orbits: Bilateral lens replacement.  Orbits otherwise unremarkable. Sinuses: Mucosal thickening in the right maxillary sinus. Other: Trace  fluid in the left mastoid tip. Calcification of multiple vessels in the scalp. ASPECTS [facility] Stroke Program Early CT Score) - Ganglionic level infarction (caudate, lentiform nuclei, internal capsule, insula, M1-M3 cortex): 7 - Supraganglionic infarction (M4-M6 cortex): 3 Total score (0-10 with 10 being normal): 10 IMPRESSION: 1. No CT evidence of acute intracranial abnormality. 2. 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 Extensive chronic microvascular ischemic changes with remote lacunar infarcts in the right caudate and right thalamus. 3. ASPECTS is 10 These results were communicated to Dr. Lindzen at 6:15 pm on 03/05/2024 by text page via the [facility] messaging system. Electronically Signed   By: Donnice Mania M.D.   On: 03/05/2024 18:15    Scheduled Meds:  aspirin   300 mg Rectal Daily   Or   aspirin   325 mg Oral Daily   atorvastatin   40 mg Oral Daily   carvedilol   25 mg Oral BID WC   Chlorhexidine  Gluconate Cloth  6 each Topical Daily   Chlorhexidine  Gluconate Cloth  6 each Topical Q0600   cloNIDine   0.3 mg Oral TID   diltiazem   120 mg Oral Daily   doxazosin   4 mg Oral Daily   isosorbide  mononitrate  60 mg Oral Daily   losartan   100 mg Oral Daily   QUEtiapine  12.5 mg Oral TID   Continuous Infusions:   LOS: 0 days  MDM: Patient is high risk for one or more organ failure.  They necessitate ongoing hospitalization for continued IV therapies and subsequent lab monitoring. Total time spent interpreting labs and vitals, reviewing the medical record, coordinating care amongst consultants and care team members, directly assessing and discussing care with the patient and/or family: 55 min  Chrysta Fulcher, DO [facility]  To contact the attending physician between 7A-7P please use Epic Chat. To contact the covering physician during after hours 7P-7A, please review Amion.  03/07/2024, 1:58 PM   *This document has been created with the assistance of dictation software. Please  excuse typographical errors. *

## 2024-03-07 NOTE — Evaluation (Signed)
 Physical Therapy Evaluation Patient Details Name: Tracy Jennings MRN: [id number] DOB: [birth year] Today's Date: 03/07/2024  History of Present Illness  Pt is a 68 year old female presents with slurred speech,  left facial spasm, left sided weakness.    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  She was able to participate needing excessive and repeated cuing and encouragement but still had many moments of blank staring as well as difficulty managing walker, etc during ambulation (hitting obstacles, needing repeated direct assist to turn/advance walker at times as she would stop and stare).  Unsure of her baseline but pt clearly is not at lives alone baseline.  She will need continued PT to address functional limitations.      If plan is discharge home, recommend the following: A little help with walking and/or transfers;A little help with bathing/dressing/bathroom;Assistance with cooking/housework;Assist for transportation;Help with stairs or ramp for entrance;Supervision due to cognitive status   Can travel by private vehicle        Equipment Recommendations Rolling walker (2 wheels) (she indicates she has a 5TT)  Recommendations for Other Services       Functional Status Assessment Patient has had a recent decline in their functional status and demonstrates the ability to make significant improvements in function in a reasonable and predictable amount of time.     Precautions / Restrictions Precautions Precautions: Fall Recall of Precautions/Restrictions: Impaired Restrictions Weight Bearing Restrictions Per Provider Order: No      Mobility  Bed Mobility Overal bed mobility: Needs Assistance Bed Mobility: Supine to Sit     Supine to sit: Contact guard, Used rails           Transfers Overall transfer level: Needs assistance Equipment used: Rolling walker (2 wheels) Transfers: Sit to/from Stand Sit to Stand: Contact guard assist, Min assist           General transfer comment: slow to initiate movement, needing cuing but able to rise w/o direct assist with much encouragement    Ambulation/Gait Ambulation/Gait assistance: Min assist Gait Distance (Feet): 35 Feet Assistive device: Rolling walker (2 wheels)         General Gait Details: Pt struggled to control/manipulate walker needing direct assist and still hitting non-moving obstacles multiple times.  She tended to trail R LE behind (improved minimally with repeated cuing).  Pt c/o increasing back pain with increased distance, leaning forearms on the walker and ultimatley needing to sit. Unsure of baseline, but pt appears far from it at this point.  Stairs            Wheelchair Mobility     Tilt Bed    Modified Rankin (Stroke Patients Only)       Balance Overall balance assessment: Needs assistance Sitting-balance support: Feet supported Sitting balance-Leahy Scale: Good     Standing balance support: Bilateral upper extremity supported, During functional activity, Reliant on assistive device for balance Standing balance-Leahy Scale: Fair                               Pertinent Vitals/Pain Pain Assessment Faces Pain Scale: No hurt    Home Living Family/patient expects to be discharged to:: Unsure Living Arrangements: Alone Available Help at Discharge: Family;Available PRN/intermittently Type of Home: House Home Access: Stairs to enter Entrance Stairs-Rails: Right;Left  Entrance Stairs-Number of Steps: 5   Home Layout: One level Home Equipment: Educational psychologist (4 wheels) Additional Comments: will need to confirm PLOF    Prior Function Prior Level of Function : Patient poor historian/Family not available             Mobility Comments: amb with  rollator ADLs Comments: sister helps w groceries/meds     Extremity/Trunk Assessment   Upper Extremity Assessment Upper Extremity Assessment: Generalized weakness    Lower Extremity Assessment Lower Extremity Assessment: Generalized weakness (symmetrical b/l, struggled to follow simple cuing for gross motor/strength testing)       Communication   Communication Communication: No apparent difficulties    Cognition Arousal: Alert Behavior During Therapy: Flat affect   PT - Cognitive impairments: Difficult to assess                       PT - Cognition Comments: Pt struggled to process even simple single-step instructions, nearly consistent cuing to stay on task. Following commands: Impaired Following commands impaired: Follows one step commands inconsistently     Cueing Cueing Techniques: Verbal cues, Gestural cues, Tactile cues, Visual cues     General Comments      Exercises     Assessment/Plan    PT Assessment Patient needs continued PT services  PT Problem List Decreased strength;Decreased range of motion;Decreased activity tolerance;Decreased balance;Decreased mobility;Decreased cognition;Decreased knowledge of use of DME;Decreased safety awareness       PT Treatment Interventions DME instruction;Gait training;Stair training;Functional mobility training;Therapeutic activities;Therapeutic exercise;Balance training;Cognitive remediation;Patient/family education    PT Goals (Current goals can be found in the Care Plan section)  Acute Rehab PT Goals Patient Stated Goal: go home PT Goal Formulation: With patient Time For Goal Achievement: 03/20/24 Potential to Achieve Goals: Fair    Frequency Min 2X/week     Co-evaluation   Reason for Co-Treatment: To address functional/ADL transfers;Necessary to address cognition/behavior during functional activity;For patient/therapist safety (for portion of eval/mobility for safety)           AM-PAC PT 6  Clicks Mobility  Outcome Measure Help needed turning from your back to your side while in a flat bed without using bedrails?: A Little Help needed moving from lying on your back to sitting on the side of a flat bed without using bedrails?: A Little Help needed moving to and from a bed to a chair (including a wheelchair)?: A Little Help needed standing up from a chair using your arms (e.g., wheelchair or bedside chair)?: A Little Help needed to walk in hospital room?: A Little Help needed climbing 3-5 steps with a railing? : A Lot 6 Click Score: 17    End of Session Equipment Utilized During Treatment: Gait belt Activity Tolerance: Patient tolerated treatment well Patient left: with bed alarm set;with call bell/phone within reach (OT in room) Nurse Communication: Mobility status PT Visit Diagnosis: Muscle weakness (generalized) (M62.81);Difficulty in walking, not elsewhere classified (R26.2)    Time: 9143-9084 PT Time Calculation (min) (ACUTE ONLY): 19 min   Charges:   PT Evaluation $PT Eval Low Complexity: 1 Low   PT General Charges $$ ACUTE PT VISIT: 1 Visit         [name], DPT 03/07/2024, 10:58 AM

## 2024-03-07 NOTE — Progress Notes (Signed)
 [facility]  ROUNDING NOTE   Subjective:   Tracy Jennings is a 68 year old female with past medical conditions including dementia, hypertension, diabetes, hyperlipidemia, cervical cancer, recurrent pancreatitis, PVD, and end-stage renal disease on hemodialysis.  Patient presents to the emergency department after experiencing slurred speech, left-sided facial spasms and weakness.  Patient has been admitted for TIA (transient ischemic attack) [G45.9]  Patient is known to our practice and receives outpatient dialysis treatments at [facility] on a MWF schedule, supervised by Dr. Dennise.   Patient seen sitting up in chair Therapy at bedside assisting with tray set up Patient alert and oriented to self and place   Objective:  Vital signs in last 24 hours:  Temp:  [97.5 F (36.4 C)-98.2 F (36.8 C)] 98 F (36.7 C) (06/26 0424) Pulse Rate:  [62-86] 64 (06/26 0724) Resp:  [9-21] 18 (06/26 0724) BP: (118-225)/(51-86) 185/64 (06/26 1012) SpO2:  [89 %-100 %] 97 % (06/26 0724) Weight:  [54.4 kg] 54.4 kg (06/25 1228)  Weight change: -1.665 kg Filed Weights   03/06/24 0100 03/06/24 1228  Weight: 56.1 kg 54.4 kg    Intake/Output: I/O last 3 completed shifts: In: 0  Out: -100    Intake/Output this shift:  No intake/output data recorded.  Physical Exam: General: NAD  Head: Normocephalic, atraumatic. Moist oral mucosal membranes  Eyes: Anicteric  Neck: Supple  Lungs:  Clear to auscultation  Heart: Regular rate and rhythm  Abdomen:  Soft, nontender  Extremities: No peripheral edema.  Neurologic: Awake, alert, conversant  Skin: Warm,dry, no rash  Access: Left aVF    Basic Metabolic Panel: Recent Labs  Lab 03/04/24 1239 03/05/24 1810  NA 137 135  K 6.2* 4.6  CL 100 95*  CO2 23 26  GLUCOSE 80 100*  BUN 72* 47*  CREATININE 9.67* 7.33*  CALCIUM  8.7* 9.2    Liver Function Tests: Recent Labs  Lab 03/04/24 1239 03/05/24 1810  AST 26 23  ALT  16 14  ALKPHOS 30* 29*  BILITOT 0.9 0.7  PROT 7.2 6.9  ALBUMIN 3.7 3.5   No results for input(s): LIPASE, AMYLASE in the last 168 hours. No results for input(s): AMMONIA in the last 168 hours.  CBC: Recent Labs  Lab 03/04/24 1315 03/05/24 1810  WBC 5.6 4.3  NEUTROABS 4.4 2.8  HGB 10.8* 10.9*  HCT 34.0* 33.9*  MCV 94.2 94.2  PLT 129* 150    Cardiac Enzymes: No results for input(s): CKTOTAL, CKMB, CKMBINDEX, TROPONINI in the last 168 hours.  BNP: Invalid input(s): POCBNP  CBG: Recent Labs  Lab 03/07/24 0724  GLUCAP 112*    Microbiology: Results for orders placed or performed during the hospital encounter of 09/14/22  Resp panel by RT-PCR (RSV, Flu A&B, Covid) Anterior Nasal Swab     Status: None   Collection Time: 09/14/22  2:45 AM   Specimen: Anterior Nasal Swab  Result Value Ref Range Status   SARS Coronavirus 2 by RT PCR NEGATIVE NEGATIVE Final    Comment: (NOTE) SARS-CoV-2 target nucleic acids are NOT DETECTED.  The SARS-CoV-2 RNA is generally detectable in upper respiratory specimens during the acute phase of infection. The lowest concentration of SARS-CoV-2 viral copies this assay can detect is 138 copies/mL. A negative result does not preclude SARS-Cov-2 infection and should not be used as the sole basis for treatment or other patient management decisions. A negative result may occur with  improper specimen collection/handling, submission of specimen other than nasopharyngeal swab, presence of  viral mutation(s) within the areas targeted by this assay, and inadequate number of viral copies(<138 copies/mL). A negative result must be combined with clinical observations, patient history, and epidemiological information. The expected result is Negative.  Fact Sheet for Patients:  BloggerCourse.com  Fact Sheet for Healthcare Providers:  SeriousBroker.it  This test is no t yet approved or  cleared by the [place]  FDA and  has been authorized for detection and/or diagnosis of SARS-CoV-2 by FDA under an Emergency Use Authorization (EUA). This EUA will remain  in effect (meaning this test can be used) for the duration of the COVID-19 declaration under Section 564(b)(1) of the Act, 21 U.S.C.section 360bbb-3(b)(1), unless the authorization is terminated  or revoked sooner.       Influenza A by PCR NEGATIVE NEGATIVE Final   Influenza B by PCR NEGATIVE NEGATIVE Final    Comment: (NOTE) The Xpert Xpress SARS-CoV-2/FLU/RSV plus assay is intended as an aid in the diagnosis of influenza from Nasopharyngeal swab specimens and should not be used as a sole basis for treatment. Nasal washings and aspirates are unacceptable for Xpert Xpress SARS-CoV-2/FLU/RSV testing.  Fact Sheet for Patients: BloggerCourse.com  Fact Sheet for Healthcare Providers: SeriousBroker.it  This test is not yet approved or cleared by the [place]  FDA and has been authorized for detection and/or diagnosis of SARS-CoV-2 by FDA under an Emergency Use Authorization (EUA). This EUA will remain in effect (meaning this test can be used) for the duration of the COVID-19 declaration under Section 564(b)(1) of the Act, 21 U.S.C. section 360bbb-3(b)(1), unless the authorization is terminated or revoked.     Resp Syncytial Virus by PCR NEGATIVE NEGATIVE Final    Comment: (NOTE) Fact Sheet for Patients: BloggerCourse.com  Fact Sheet for Healthcare Providers: SeriousBroker.it  This test is not yet approved or cleared by the [place]  FDA and has been authorized for detection and/or diagnosis of SARS-CoV-2 by FDA under an Emergency Use Authorization (EUA). This EUA will remain in effect (meaning this test can be used) for the duration of the COVID-19 declaration under Section 564(b)(1) of the Act, 21  U.S.C. section 360bbb-3(b)(1), unless the authorization is terminated or revoked.  Performed at [facility], [address] [place], [place] [address]     Coagulation Studies: Recent Labs    03/05/24 1810  LABPROT 16.0*  INR 1.3*    Urinalysis: No results for input(s): COLORURINE, LABSPEC, PHURINE, GLUCOSEU, HGBUR, BILIRUBINUR, KETONESUR, PROTEINUR, UROBILINOGEN, NITRITE, LEUKOCYTESUR in the last 72 hours.  Invalid input(s): APPERANCEUR    Imaging: US  Carotid Bilateral (at [facility] and [facility] only) Result Date: 03/06/2024 CLINICAL DATA:  Transient ischemic attack EXAM: BILATERAL CAROTID DUPLEX ULTRASOUND TECHNIQUE: Elnor scale imaging, color Doppler and duplex ultrasound were performed of bilateral carotid and vertebral arteries in the neck. COMPARISON:  None Available. FINDINGS: Criteria: Quantification of carotid stenosis is based on velocity parameters that correlate the residual internal carotid diameter with NASCET-based stenosis levels, using the diameter of the distal internal carotid lumen as the denominator for stenosis measurement. The following velocity measurements were obtained: RIGHT ICA: 186 cm/sec CCA: 60 cm/sec SYSTOLIC ICA/CCA RATIO:  3.1 ECA: 112 cm/sec LEFT ICA: 103 cm/sec CCA: 166 cm/sec SYSTOLIC ICA/CCA RATIO:  0.6 ECA: 37 cm/sec RIGHT CAROTID ARTERY: There is no significant plaque in the proximal right internal carotid artery. An elevated velocity is measured in the distal right internal carotid artery. RIGHT VERTEBRAL ARTERY:  Antegrade LEFT CAROTID ARTERY:  There is no significant plaque. LEFT VERTEBRAL ARTERY:  Antegrade.  IMPRESSION: 1. Right carotid system: An elevated velocity is measured within the distal right internal carotid artery which suggests a stenosis between 50 and 69%. There is no significant plaque identified and therefore this may be artifactual. Consideration can be given toward further evaluation by CT angiogram for  further characterization if clinically appropriate. 2. Left carotid system: No evidence of significant flow limiting stenosis. Electronically Signed   By: Maude Naegeli M.D.   On: 03/06/2024 12:47   MR ANGIO HEAD WO CONTRAST Result Date: 03/06/2024 CLINICAL DATA:  Stroke/TIA, determine embolic source EXAM: MRA HEAD WITHOUT CONTRAST TECHNIQUE: Angiographic images of the Circle of Willis were acquired using MRA technique without intravenous contrast. COMPARISON:  CT head dated March 05, 2024. FINDINGS: Anterior circulation: Limited secondary to patient motion. Diminutive or stenotic right A1 segment. No obvious aneurysm or large vessel occlusion. Posterior circulation: Limited secondary to motion. Moderate focal stenosis of the proximal basilar artery. Anatomic variants: None. Other: None. IMPRESSION: 1. Limited study secondary to patient motion. 2. 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 Electronically Signed   By: Evalene Coho M.D.   On: 03/06/2024 10:36   MR BRAIN WO CONTRAST Result Date: 03/06/2024 CLINICAL DATA:  Transient ischemic attack (TIA) EXAM: MRI HEAD WITHOUT CONTRAST TECHNIQUE: Multiplanar, multiecho pulse sequences of the brain and surrounding structures were obtained without intravenous contrast. COMPARISON:  CT of the head dated March 05, 2024. FINDINGS: Brain: There is no restricted diffusion to indicate acute or recent infarction. There are encephalomalacia changes within the right parietal lobe. A chronic lacunar infarct is noted within the right caudate nucleus. There is moderate diffuse cerebral white matter disease present. There is no evidence of hemorrhage, mass, acute cortical infarct or hydrocephalus. Vascular: Normal flow voids. Skull and upper cervical spine: Normal marrow signal. Sinuses/Orbits: Status post bilateral lens replacement. Mild mucosal disease within the maxillary sinuses. Other: None. IMPRESSION: 1. Chronic encephalomalacia within the  right parietal lobe and moderately advanced diffuse cerebral white matter disease. Electronically Signed   By: Evalene Coho M.D.   On: 03/06/2024 10:31   CT HEAD CODE STROKE WO CONTRAST Result Date: 03/05/2024 CLINICAL DATA:  Code stroke.  Neuro deficit, concern for stroke. EXAM: CT HEAD WITHOUT CONTRAST TECHNIQUE: Contiguous axial images were obtained from the base of the skull through the vertex without intravenous contrast. RADIATION DOSE REDUCTION: This exam was performed according to the departmental dose-optimization program which includes automated exposure control, adjustment of the mA and/or kV according to patient size and/or use of iterative reconstruction technique. COMPARISON:  CT head 03/04/2024 and earlier. FINDINGS: Brain: No acute intracranial hemorrhage. No CT evidence of new large territory infarct. Encephalomalacia in the right parietal lobe compatible with remote infarct a similar appearance of associated calcification. Nonspecific hypoattenuation in the periventricular and subcortical white matter favored to reflect chronic microvascular ischemic changes. Additional punctate calcification in the right frontal lobe. Remote lacunar infarcts in the right caudate and right thalamus. Mild parenchymal volume loss. No midline shift. Basilar cisterns are patent. Ventricles: The ventricles are normal. Vascular: Atherosclerotic calcifications of the carotid siphons. No hyperdense vessel. Skull: No acute or aggressive finding. Orbits: Bilateral lens replacement.  Orbits otherwise unremarkable. Sinuses: Mucosal thickening in the right maxillary sinus. Other: Trace fluid in the left mastoid tip. Calcification of multiple vessels in the scalp. ASPECTS [facility] Stroke Program Early CT Score) - Ganglionic level infarction (caudate, lentiform nuclei, internal capsule, insula, M1-M3 cortex): 7 - Supraganglionic infarction (M4-M6 cortex): 3 Total score (0-10 with  10 being normal): 10 IMPRESSION: 1. No CT  evidence of acute intracranial abnormality. 2. 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 Extensive chronic microvascular ischemic changes with remote lacunar infarcts in the right caudate and right thalamus. 3. ASPECTS is 10 These results were communicated to Dr. Lindzen at 6:15 pm on 03/05/2024 by text page via the [facility] messaging system. Electronically Signed   By: Donnice Mania M.D.   On: 03/05/2024 18:15     Medications:     aspirin   300 mg Rectal Daily   Or   aspirin   325 mg Oral Daily   atorvastatin   40 mg Oral Daily   carvedilol   25 mg Oral BID WC   Chlorhexidine  Gluconate Cloth  6 each Topical Daily   Chlorhexidine  Gluconate Cloth  6 each Topical Q0600   cloNIDine   0.3 mg Oral TID   diltiazem   120 mg Oral Daily   doxazosin   4 mg Oral Daily   isosorbide  mononitrate  60 mg Oral Daily   losartan   100 mg Oral Daily   QUEtiapine  12.5 mg Oral TID   acetaminophen  **OR** acetaminophen  (TYLENOL ) oral liquid 160 mg/5 mL **OR** acetaminophen , enalaprilat , haloperidol  lactate, labetalol , LORazepam , ondansetron  (ZOFRAN ) IV, senna-docusate, sodium chloride  flush  Assessment/ Plan:  Ms. SAUSHA RAYMOND is a 68 y.o.  female with past medical conditions including dementia, hypertension, diabetes, hyperlipidemia, cervical cancer, recurrent pancreatitis, PVD, and end-stage renal disease on hemodialysis.  Patient presents to the emergency department after experiencing slurred speech, left-sided facial spasms and weakness.  Patient has been admitted for TIA (transient ischemic attack) [G45.9]   Transient ischemic attack, reporting symptoms of slurred speech, involuntary left-sided facial spasms and weakness.  Symptoms have resolved.  Neurology consulted, TIA versus partial complex seizure.  Brain imaging negative for acute infarct.  Carotid ultrasound shows right-sided 50 to 60% stenosis without plaque.  TEE ordered.    2.  End-stage renal disease on hemodialysis.  Patient received  dialysis yesterday.  Initially tolerated treatment well but as treatment progressed, confusion increased.  Patient required multiple attempts of redirection from staff.  Noted to be pulling at lines and bending access arms with needles placed for treatment.  Treatment terminated 56 minutes early due to safety concerns.  Next treatment scheduled for Friday.  3.  Hypertensive urgency, blood pressure severely elevated on admission.  Home regimen includes amlodipine , carvedilol , doxazosin , losartan , and isosorbide .  All were held initially however have been restarted.  Amlodipine  remains held.  Patient also receiving clonidine  at this time.  4. Secondary Hyperparathyroidism: with outpatient labs:   Lab Results  Component Value Date   CALCIUM  9.2 03/05/2024   PHOS 7.1 (H) 04/01/2022    Patient is prescribed calcium  acetate with meals outpatient.  Will continue to monitor bone minerals during this admission.  Will obtain updated phosphorus level in a.m. with dialysis.    LOS: 0 Damian Buckles 6/26/202510:42 AM

## 2024-03-08 DIAGNOSIS — G459 Transient cerebral ischemic attack, unspecified: Secondary | ICD-10-CM | POA: Diagnosis not present

## 2024-03-08 LAB — RENAL FUNCTION PANEL
Albumin: 3.3 g/dL — ABNORMAL LOW (ref 3.5–5.0)
Anion gap: 12 (ref 5–15)
BUN: 42 mg/dL — ABNORMAL HIGH (ref 8–23)
CO2: 27 mmol/L (ref 22–32)
Calcium: 8.6 mg/dL — ABNORMAL LOW (ref 8.9–10.3)
Chloride: 94 mmol/L — ABNORMAL LOW (ref 98–111)
Creatinine, Ser: 7.84 mg/dL — ABNORMAL HIGH (ref 0.44–1.00)
GFR, Estimated: 5 mL/min — ABNORMAL LOW (ref >60)
Glucose, Bld: 120 mg/dL — ABNORMAL HIGH (ref 70–99)
Phosphorus: 6.2 mg/dL — ABNORMAL HIGH (ref 2.5–4.6)
Potassium: 4.4 mmol/L (ref 3.5–5.1)
Sodium: 133 mmol/L — ABNORMAL LOW (ref 135–145)

## 2024-03-08 LAB — CBC
HCT: 31 % — ABNORMAL LOW (ref 36.0–46.0)
Hemoglobin: 10.2 g/dL — ABNORMAL LOW (ref 12.0–15.0)
MCH: 30.4 pg (ref 26.0–34.0)
MCHC: 32.9 g/dL (ref 30.0–36.0)
MCV: 92.5 fL (ref 80.0–100.0)
Platelets: 144 10*3/uL — ABNORMAL LOW (ref 150–400)
RBC: 3.35 MIL/uL — ABNORMAL LOW (ref 3.87–5.11)
RDW: 14.3 % (ref 11.5–15.5)
WBC: 4 10*3/uL (ref 4.0–10.5)
nRBC: 0 % (ref 0.0–0.2)

## 2024-03-08 MED ORDER — QUETIAPINE FUMARATE 25 MG PO TABS
12.5000 mg | ORAL_TABLET | Freq: Once | ORAL | Status: AC
  Administered 2024-03-08: 12.5 mg via ORAL
  Filled 2024-03-08: qty 1

## 2024-03-08 MED ORDER — ISOSORBIDE MONONITRATE ER 30 MG PO TB24
120.0000 mg | ORAL_TABLET | Freq: Every day | ORAL | Status: DC
  Administered 2024-03-09 – 2024-03-16 (×8): 120 mg via ORAL
  Filled 2024-03-08 (×8): qty 4
  Filled 2024-03-08: qty 2

## 2024-03-08 MED ORDER — CALCIUM ACETATE (PHOS BINDER) 667 MG PO CAPS
667.0000 mg | ORAL_CAPSULE | Freq: Three times a day (TID) | ORAL | Status: DC
  Administered 2024-03-08 – 2024-03-13 (×13): 667 mg via ORAL
  Filled 2024-03-08 (×14): qty 1

## 2024-03-08 NOTE — Plan of Care (Signed)

## 2024-03-08 NOTE — Progress Notes (Signed)
 Hemodialysis Note:  Received patient in bed to unit. Alert and oriented. Informed consent singed and in chart.  Treatment initiated: 0800 Treatment completed: 1122  Access used: Left AVF Access issues: None  Patient tolerated well. Transported back to room, alert without acute distress. Report given to patient's RN.  Total UF removed: 1 liter Medications given: None  Post HD weight: 57.8 Kg  [facility]

## 2024-03-08 NOTE — Progress Notes (Signed)
 PROGRESS NOTE    Tracy Jennings  FMW:[id number] DOB: Nov 24, 1955 DOA: 03/05/2024 PCP: Zachary Idelia LABOR, MD  Chief Complaint  Patient presents with   Code Stroke    Hospital Course:  This patient is a 68 year old female with ESRD on HD MWF, HTN, HLD, DM, PVD, dementia, chronic recurrent pancreatitis, stroke, cervical cancer, HIT, who presents with slurred speech, left facial spasm, left-sided weakness.  Patient had syncopal episode on 6/23 and was seen in the ED.  At that time she was found to have O2 desaturation to 80% due to pulmonary edema on chest x-ray as well as hyperkalemia with a potassium of 6.2.  She had negative head CT during that admission.  She received hemodialysis and then was discharged home.  Her last known normal was 6/24 at 1700.  Upon arrival to the ED patient's confusion had resolved entirely.  Labs in the ED mostly unremarkable, potassium 4.6, creatinine consistent with ESRD.  Blood pressure was found to be 252/79.  Patient was placed in telemetry bed for observation.  Code stroke was called.Blood pressure resolved.  Head CT was without acute intracranial abnormality.  Chronic infarct seen on right parietal lobe and extensive chronic microvascular ischemic changes with remote lacunar infarcts in the right caudate and right thalamus appreciated.  Patient was admitted for workup of presumed TIA.  LDL 96, hemoglobin A1c 5.1%.  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  MRI brain revealed chronic encephalomalacia within the right parietal lobe and advanced diffuse cerebral white matter disease.  Patient underwent carotid Dopplers which revealed elevated velocity within the distal right internal carotid artery suggesting stenosis of 50 to 69% though no significant plaque identified and thus this may be artifactual. Patient underwent hemodialysis on 6/25 but left her session early AMA.  When she returned to the room she reported that she wanted to leave  AGAINST MEDICAL ADVICE.  I had extensive discussion with the patient at bedside.  Also present for this discussion was nurse, Camillia, and patient's 2 sisters.  At the time my evaluation patient was able to demonstrate medical capacity.  She was alert and oriented x 4 and appropriately repeated my concerns believing the hospital. However, when patient attempted to sign AMA paperwork her signature was illegible.  She began getting upset and responding to internal stimuli.  Her confusion increased to the point that it was no longer safe to discharge.  Psychiatry was consulted.  Psychiatry met with the patient on 6/26 and confirms acute delirium, currently the patient does not have capacity to make medical decisions.  She has been started on Seroquel .    Subjective: No acute events overnight. On evaluation today patient is receiving HD. She has no complaints. She reports feeling like she ah as a clearer mind today.     Objective: Vitals:   03/08/24 1000 03/08/24 1030 03/08/24 1100 03/08/24 1122  BP: (!) 155/78 (!) 163/44 (!) 171/63 (!) 162/51  Pulse: 61 62 65 65  Resp: 14 10 (!) 21 11  Temp:    98.8 F (37.1 C)  TempSrc:    Axillary  SpO2: 100% 100% 100% 100%  Weight:    57.8 kg  Height:        Intake/Output Summary (Last 24 hours) at 03/08/2024 1606 Last data filed at 03/08/2024 1122 Gross per 24 hour  Intake 0 ml  Output 1000 ml  Net -1000 ml   Filed Weights   03/06/24 1228 03/08/24 0733 03/08/24 1122  Weight:  54.4 kg 58.8 kg 57.8 kg   Examination: General exam: Appears calm and comfortable, NAD  Respiratory system: No work of breathing, symmetric chest wall expansion Cardiovascular system: S1 & S2 heard, RRR.  Gastrointestinal system: Abdomen is nondistended, soft and nontender.  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  Throughout the stay her clinical picture is more consistent with acute delirium likely with underlying dementia. - MRA: Moderate stenosis of proximal basilar artery at 50% - MRI brain: Chronic encephalomalacia with the right parietal lobe and advanced diffuse cerebral white matter disease - Carotid Dopplers: Elevated velocity within the distal right internal carotid artery suggesting stenosis of 50 to 69% though no significant plaque identified and thus may be artifactual. - EEG: Cortical dysfunction arising from right temporal parietal region secondary to underlying structural abnormality.  No seizures or epileptiform discharges. - Echocardiogram: EF 55 to 60%, no regional wall motion abnormalities, severe left ventricular hypertrophy, grade 2 diastolic dysfunction elevated pulmonary arterial systolic pressures.  Calcification of aortic valve - Continue statin, continue aspirin . - Lipids and A1c at goal. - Neurology consulted.  Appreciate recommendations  Dementia with behavioral disturbance - Remote history of dementia. No outpatient work up. - Mentation and capacity changing regularly - Psychiatry consulted, has recommended initiation of Seroquel . Pt doing well now. - Have discussed with neurology who suspects Lewy body dementia is playing a role and recommends avoiding antipsychotics - Please see psychiatry notes for more details - Presently patient does not have capacity to make medical decisions or leave AMA.  We have significant  concerns about her ability to take care of herself at home.  She will likely require placement for safety  Hypertensive urgency - Systolic blood pressure severely elevated above 200. - Resume all home meds - Continue to titrate daily - Currently improved on clonidine , increase dose of Imdur  today - Not taking medication consistently at home.  Likely complicated by her dementia.  Pulmonary arterial hypertension Grade 2 diastolic dysfunction - As seen on echocardiogram - Continue with GDMT - Continue with tight blood pressure control - Clinically euvolemic, volume to be removed via dialysis as needed  ESRD - On HD MWF - Nephrology consulted during this admission, continue with dialysis per renal  Elevated troponin - No chest pain, no shortness of breath.  Troponin 41 -> 43. likely secondary to demand ischemia and ESRD - Continue aspirin  and Lipitor  Type 2 diabetes with renal manifestations - Recent hemoglobin A1c 5.1%, well-controlled.  Not currently taking medications - Daily CBG  Anemia of chronic disease, ESRD - Trend CBC. - Hemoglobin currently stable  Hyperlipidemia - Statin  Heparin -induced thrombocytopenia - Avoid heparin  or Lovenox  products for now - SCDs as needed  DVT prophylaxis: SCDs   Code Status: Full Code Disposition:  Inpatient, needs placement. TOC consulted to arrange Consultants:    Procedures:    Antimicrobials:  Anti-infectives (From admission, onward)    None  Data Reviewed: I have personally reviewed following labs and imaging studies CBC: Recent Labs  Lab 03/04/24 1315 03/05/24 1810 03/08/24 0802  WBC 5.6 4.3 4.0  NEUTROABS 4.4 2.8  --   HGB 10.8* 10.9* 10.2*  HCT 34.0* 33.9* 31.0*  MCV 94.2 94.2 92.5  PLT 129* 150 144*   Basic Metabolic Panel: Recent Labs  Lab 03/04/24 1239 03/05/24 1810 03/08/24 0802  NA 137 135 133*  K 6.2* 4.6 4.4  CL 100 95* 94*  CO2 23 26 27   GLUCOSE 80 100* 120*  BUN 72* 47* 42*   CREATININE 9.67* 7.33* 7.84*  CALCIUM  8.7* 9.2 8.6*  PHOS  --   --  6.2*   GFR: Estimated Creatinine Clearance: 5.8 mL/min (A) (by C-G formula based on SCr of 7.84 mg/dL (H)). Liver Function Tests: Recent Labs  Lab 03/04/24 1239 03/05/24 1810 03/08/24 0802  AST 26 23  --   ALT 16 14  --   ALKPHOS 30* 29*  --   BILITOT 0.9 0.7  --   PROT 7.2 6.9  --   ALBUMIN 3.7 3.5 3.3*   CBG: Recent Labs  Lab 03/07/24 0724  GLUCAP 112*    No results found for this or any previous visit (from the past 240 hours).   Radiology Studies: EEG adult Result Date: 03/07/2024 Shelton Arlin KIDD, MD     03/07/2024  1:23 PM Patient Name: Tracy Jennings MRN: [id number] Epilepsy Attending: Arlin KIDD Shelton Referring Physician/Provider: Merrianne Locus, MD Date: 03/07/2024 Duration: 41.54 mins Patient history: 68yo F with slurred speech, left-sided facial spasms and weakness. EEG to evaluate for seizure Level of alertness: Awake, asleep AEDs during EEG study: None Technical aspects: This EEG study was done with scalp electrodes positioned according to the 10-20 International system of electrode placement. Electrical activity was reviewed with band pass filter of 1-70Hz , sensitivity of 7 uV/mm, display speed of 54mm/sec with a 60Hz  notched filter applied as appropriate. EEG data were recorded continuously and digitally stored.  Video monitoring was available and reviewed as appropriate. Description: The posterior dominant rhythm consists of 8-9 Hz activity of moderate voltage (25-35 uV) seen predominantly in posterior head regions, symmetric and reactive to eye opening and eye closing. Sleep was characterized by vertex waves, sleep spindles (12 to 14 Hz), maximal frontocentral region. EEG showed continuous 3 to 6 Hz theta-delta slowing  in right temporo-parietal region. Hyperventilation and photic stimulation were not performed.   ABNORMALITY - Continuous slow,  right temporo-parietal region. IMPRESSION: This study is  suggestive of cortical dysfunction arising from right temporo-parietal region likely secondary to underlying structural abnormality. No seizures or epileptiform discharges were seen throughout the recording. Please note lac of epileptiform activity during interictal EEG does not exclude the diagnosis of epilepsy. Arlin KIDD Shelton   ECHOCARDIOGRAM COMPLETE Result Date: 03/07/2024    ECHOCARDIOGRAM REPORT   Patient Name:   Tracy Jennings Date of Exam: 03/06/2024 Medical Rec #:  [id number]        Height:       63.0 in Accession #:    [id number]       Weight:       119.9 lb Date of Birth:  07-Dec-1955        BSA:          1.556 m Patient Age:    68 years         BP:           177/86 mmHg Patient Gender: F  HR:           56 bpm. Exam Location:  [facility] Procedure: 2D Echo, Cardiac Doppler and Color Doppler (Both Spectral and Color            Flow Doppler were utilized during procedure). Indications:     TIA G45.9  History:         Patient has prior history of Echocardiogram examinations.                  Signs/Symptoms:Murmur.  Sonographer:     Bari Roar Referring Phys:  [id number] XILIN NIU Diagnosing Phys: Evalene Lunger MD  Sonographer Comments: Image acquisition challenging due to uncooperative patient. IMPRESSIONS  1. Left ventricular ejection fraction, by estimation, is 55 to 60%. The left ventricle has normal function. The left ventricle has no regional wall motion abnormalities. There is severe left ventricular hypertrophy. Left ventricular diastolic parameters  are consistent with Grade II diastolic dysfunction (pseudonormalization).  2. Right ventricular systolic function is normal. The right ventricular size is normal. There is moderately elevated pulmonary artery systolic pressure. The estimated right ventricular systolic pressure is 46.2 mmHg.  3. The mitral valve is normal in structure. No evidence of mitral valve regurgitation. Mild mitral stenosis. The mean mitral valve gradient is 5.0 mmHg.  Moderate mitral annular calcification.  4. Tricuspid valve regurgitation is mild to moderate.  5. The aortic valve is calcified. There is moderate calcification of the aortic valve. Aortic valve regurgitation is mild to moderate. Mild to moderate aortic valve stenosis. Aortic valve mean gradient measures 16.5 mmHg.  6. The inferior vena cava is normal in size with greater than 50% respiratory variability, suggesting right atrial pressure of 3 mmHg. FINDINGS  Left Ventricle: Left ventricular ejection fraction, by estimation, is 55 to 60%. The left ventricle has normal function. The left ventricle has no regional wall motion abnormalities. Strain was performed and the global longitudinal strain is indeterminate. The left ventricular internal cavity size was normal in size. There is severe left ventricular hypertrophy. Left ventricular diastolic parameters are consistent with Grade II diastolic dysfunction (pseudonormalization). Right Ventricle: The right ventricular size is normal. No increase in right ventricular wall thickness. Right ventricular systolic function is normal. There is moderately elevated pulmonary artery systolic pressure. The tricuspid regurgitant velocity is 3.21 m/s, and with an assumed right atrial pressure of 5 mmHg, the estimated right ventricular systolic pressure is 46.2 mmHg. Left Atrium: Left atrial size was normal in size. Right Atrium: Right atrial size was normal in size. Pericardium: There is no evidence of pericardial effusion. Mitral Valve: The mitral valve is normal in structure. There is mild calcification of the mitral valve leaflet(s). Moderate mitral annular calcification. No evidence of mitral valve regurgitation. Mild mitral valve stenosis. MV peak gradient, 13.1 mmHg. The mean mitral valve gradient is 5.0 mmHg. Tricuspid Valve: The tricuspid valve is normal in structure. Tricuspid valve regurgitation is mild to moderate. No evidence of tricuspid stenosis. Aortic Valve: The  aortic valve is calcified. There is moderate calcification of the aortic valve. Aortic valve regurgitation is mild to moderate. Aortic regurgitation PHT measures 485 msec. Mild to moderate aortic stenosis is present. Aortic valve mean gradient measures 16.5 mmHg. Aortic valve peak gradient measures 31.8 mmHg. Aortic valve area, by VTI measures 0.88 cm. Pulmonic Valve: The pulmonic valve was normal in structure. Pulmonic valve regurgitation is not visualized. No evidence of pulmonic stenosis. Aorta: The aortic root is normal in size and structure. Venous: The inferior vena cava  is normal in size with greater than 50% respiratory variability, suggesting right atrial pressure of 3 mmHg. IAS/Shunts: No atrial level shunt detected by color flow Doppler. Additional Comments: 3D was performed not requiring image post processing on an independent workstation and was indeterminate. There is a small pleural effusion in the left lateral region.  LEFT VENTRICLE PLAX 2D LVIDd:         3.60 cm   Diastology LVIDs:         2.50 cm   LV e' medial:    4.57 cm/s LV PW:         1.70 cm   LV E/e' medial:  33.7 LV IVS:        1.60 cm   LV e' lateral:   5.00 cm/s LVOT diam:     1.70 cm   LV E/e' lateral: 30.8 LV SV:         52 LV SV Index:   34 LVOT Area:     2.27 cm  RIGHT VENTRICLE RV Basal diam:  3.30 cm RV Mid diam:    2.80 cm RV S prime:     11.00 cm/s TAPSE (M-mode): 2.1 cm LEFT ATRIUM             Index        RIGHT ATRIUM           Index LA diam:        4.00 cm 2.57 cm/m   RA Area:     16.30 cm LA Vol (A2C):   59.0 ml 37.92 ml/m  RA Volume:   42.70 ml  27.44 ml/m LA Vol (A4C):   59.0 ml 37.92 ml/m LA Biplane Vol: 59.0 ml 37.92 ml/m  AORTIC VALVE                     PULMONIC VALVE AV Area (Vmax):    0.90 cm      PV Vmax:          1.04 m/s AV Area (Vmean):   0.84 cm      PV Peak grad:     4.3 mmHg AV Area (VTI):     0.88 cm      PR End Diast Vel: 8.53 msec AV Vmax:           282.00 cm/s   RVOT Peak grad:   2 mmHg AV Vmean:           187.000 cm/s AV VTI:            0.592 m AV Peak Grad:      31.8 mmHg AV Mean Grad:      16.5 mmHg LVOT Vmax:         112.00 cm/s LVOT Vmean:        69.300 cm/s LVOT VTI:          0.230 m LVOT/AV VTI ratio: 0.39 AI PHT:            485 msec  AORTA Ao Root diam: 2.80 cm Ao Asc diam:  2.80 cm MITRAL VALVE                TRICUSPID VALVE MV Area (PHT): 3.23 cm     TR Peak grad:   41.2 mmHg MV Area VTI:   1.15 cm     TR Vmax:        321.00 cm/s MV Peak grad:  13.1 mmHg MV Mean grad:  5.0 mmHg  SHUNTS MV Vmax:       1.81 m/s     Systemic VTI:  0.23 m MV Vmean:      105.0 cm/s   Systemic Diam: 1.70 cm MV Decel Time: 235 msec MV E velocity: 154.00 cm/s MV A velocity: 121.00 cm/s MV E/A ratio:  1.27 MV A Prime:    9.2 cm/s Evalene Lunger MD Electronically signed by Evalene Lunger MD Signature Date/Time: 03/07/2024/12:12:01 PM    Final     Scheduled Meds:  aspirin   300 mg Rectal Daily   Or   aspirin   325 mg Oral Daily   atorvastatin   40 mg Oral Daily   calcium  acetate  667 mg Oral TID WC   carvedilol   25 mg Oral BID WC   Chlorhexidine  Gluconate Cloth  6 each Topical Daily   Chlorhexidine  Gluconate Cloth  6 each Topical Q0600   cloNIDine   0.3 mg Oral TID   diltiazem   120 mg Oral Daily   doxazosin   4 mg Oral Daily   isosorbide  mononitrate  120 mg Oral Daily   losartan   100 mg Oral Daily   QUEtiapine   12.5 mg Oral TID   Continuous Infusions:   LOS: 1 day  MDM: Patient is high risk for one or more organ failure.  They necessitate ongoing hospitalization for continued IV therapies and subsequent lab monitoring. Total time spent interpreting labs and vitals, reviewing the medical record, coordinating care amongst consultants and care team members, directly assessing and discussing care with the patient and/or family: 55 min  Kahmari Herard, DO [facility]  To contact the attending physician between 7A-7P please use Epic Chat. To contact the covering physician during after hours 7P-7A,  please review Amion.  03/08/2024, 4:06 PM   *This document has been created with the assistance of dictation software. Please excuse typographical errors. *

## 2024-03-08 NOTE — Progress Notes (Signed)
 [facility]  ROUNDING NOTE   Subjective:   Tracy Jennings is a 68 year old female with past medical conditions including dementia, hypertension, diabetes, hyperlipidemia, cervical cancer, recurrent pancreatitis, PVD, and end-stage renal disease on hemodialysis.  Patient presents to the emergency department after experiencing slurred speech, left-sided facial spasms and weakness.  Patient has been admitted for TIA (transient ischemic attack) [G45.9] Hallucination [R44.3]  Patient is known to our practice and receives outpatient dialysis treatments at [facility] on a MWF schedule, supervised by Dr. Dennise.   Patient seen and evaluated during dialysis   HEMODIALYSIS FLOWSHEET:  Blood Flow Rate (mL/min): 349 mL/min Arterial Pressure (mmHg): -128.07 mmHg Venous Pressure (mmHg): 203.42 mmHg TMP (mmHg): 9.29 mmHg Ultrafiltration Rate (mL/min): 554 mL/min Dialysate Flow Rate (mL/min): 300 ml/min Dialysis Fluid Bolus: Normal Saline Bolus Amount (mL): 300 mL  More alert today   Objective:  Vital signs in last 24 hours:  Temp:  [97.4 F (36.3 C)-97.9 F (36.6 C)] 97.5 F (36.4 C) (06/27 0733) Pulse Rate:  [51-62] 62 (06/27 1030) Resp:  [10-20] 10 (06/27 1030) BP: (155-192)/(44-121) 163/44 (06/27 1030) SpO2:  [96 %-100 %] 100 % (06/27 1030) Weight:  [58.8 kg] 58.8 kg (06/27 0733)  Weight change:  Filed Weights   03/06/24 0100 03/06/24 1228 03/08/24 0733  Weight: 56.1 kg 54.4 kg 58.8 kg    Intake/Output: No intake/output data recorded.   Intake/Output this shift:  No intake/output data recorded.  Physical Exam: General: NAD  Head: Normocephalic, atraumatic. Moist oral mucosal membranes  Eyes: Anicteric  Neck: Supple  Lungs:  Clear to auscultation  Heart: Regular rate and rhythm  Abdomen:  Soft, nontender  Extremities: No peripheral edema.  Neurologic: Awake, alert, conversant  Skin: Warm,dry, no rash  Access: Left aVF    Basic Metabolic  Panel: Recent Labs  Lab 03/04/24 1239 03/05/24 1810 03/08/24 0802  NA 137 135 133*  K 6.2* 4.6 4.4  CL 100 95* 94*  CO2 23 26 27   GLUCOSE 80 100* 120*  BUN 72* 47* 42*  CREATININE 9.67* 7.33* 7.84*  CALCIUM  8.7* 9.2 8.6*  PHOS  --   --  6.2*    Liver Function Tests: Recent Labs  Lab 03/04/24 1239 03/05/24 1810 03/08/24 0802  AST 26 23  --   ALT 16 14  --   ALKPHOS 30* 29*  --   BILITOT 0.9 0.7  --   PROT 7.2 6.9  --   ALBUMIN 3.7 3.5 3.3*   No results for input(s): LIPASE, AMYLASE in the last 168 hours. No results for input(s): AMMONIA in the last 168 hours.  CBC: Recent Labs  Lab 03/04/24 1315 03/05/24 1810 03/08/24 0802  WBC 5.6 4.3 4.0  NEUTROABS 4.4 2.8  --   HGB 10.8* 10.9* 10.2*  HCT 34.0* 33.9* 31.0*  MCV 94.2 94.2 92.5  PLT 129* 150 144*    Cardiac Enzymes: No results for input(s): CKTOTAL, CKMB, CKMBINDEX, TROPONINI in the last 168 hours.  BNP: Invalid input(s): POCBNP  CBG: Recent Labs  Lab 03/07/24 0724  GLUCAP 112*    Microbiology: Results for orders placed or performed during the hospital encounter of 09/14/22  Resp panel by RT-PCR (RSV, Flu A&B, Covid) Anterior Nasal Swab     Status: None   Collection Time: 09/14/22  2:45 AM   Specimen: Anterior Nasal Swab  Result Value Ref Range Status   SARS Coronavirus 2 by RT PCR NEGATIVE NEGATIVE Final    Comment: (NOTE) SARS-CoV-2 target  nucleic acids are NOT DETECTED.  The SARS-CoV-2 RNA is generally detectable in upper respiratory specimens during the acute phase of infection. The lowest concentration of SARS-CoV-2 viral copies this assay can detect is 138 copies/mL. A negative result does not preclude SARS-Cov-2 infection and should not be used as the sole basis for treatment or other patient management decisions. A negative result may occur with  improper specimen collection/handling, submission of specimen other than nasopharyngeal swab, presence of viral mutation(s)  within the areas targeted by this assay, and inadequate number of viral copies(<138 copies/mL). A negative result must be combined with clinical observations, patient history, and epidemiological information. The expected result is Negative.  Fact Sheet for Patients:  BloggerCourse.com  Fact Sheet for Healthcare Providers:  SeriousBroker.it  This test is no t yet approved or cleared by the [place]  FDA and  has been authorized for detection and/or diagnosis of SARS-CoV-2 by FDA under an Emergency Use Authorization (EUA). This EUA will remain  in effect (meaning this test can be used) for the duration of the COVID-19 declaration under Section 564(b)(1) of the Act, 21 U.S.C.section 360bbb-3(b)(1), unless the authorization is terminated  or revoked sooner.       Influenza A by PCR NEGATIVE NEGATIVE Final   Influenza B by PCR NEGATIVE NEGATIVE Final    Comment: (NOTE) The Xpert Xpress SARS-CoV-2/FLU/RSV plus assay is intended as an aid in the diagnosis of influenza from Nasopharyngeal swab specimens and should not be used as a sole basis for treatment. Nasal washings and aspirates are unacceptable for Xpert Xpress SARS-CoV-2/FLU/RSV testing.  Fact Sheet for Patients: BloggerCourse.com  Fact Sheet for Healthcare Providers: SeriousBroker.it  This test is not yet approved or cleared by the [place]  FDA and has been authorized for detection and/or diagnosis of SARS-CoV-2 by FDA under an Emergency Use Authorization (EUA). This EUA will remain in effect (meaning this test can be used) for the duration of the COVID-19 declaration under Section 564(b)(1) of the Act, 21 U.S.C. section 360bbb-3(b)(1), unless the authorization is terminated or revoked.     Resp Syncytial Virus by PCR NEGATIVE NEGATIVE Final    Comment: (NOTE) Fact Sheet for  Patients: BloggerCourse.com  Fact Sheet for Healthcare Providers: SeriousBroker.it  This test is not yet approved or cleared by the [place]  FDA and has been authorized for detection and/or diagnosis of SARS-CoV-2 by FDA under an Emergency Use Authorization (EUA). This EUA will remain in effect (meaning this test can be used) for the duration of the COVID-19 declaration under Section 564(b)(1) of the Act, 21 U.S.C. section 360bbb-3(b)(1), unless the authorization is terminated or revoked.  Performed at [facility], [address] [place], [place] [address]     Coagulation Studies: Recent Labs    03/05/24 1810  LABPROT 16.0*  INR 1.3*    Urinalysis: No results for input(s): COLORURINE, LABSPEC, PHURINE, GLUCOSEU, HGBUR, BILIRUBINUR, KETONESUR, PROTEINUR, UROBILINOGEN, NITRITE, LEUKOCYTESUR in the last 72 hours.  Invalid input(s): APPERANCEUR    Imaging: EEG adult Result Date: 03/07/2024 Shelton Arlin KIDD, MD     03/07/2024  1:23 PM Patient Name: Tracy Jennings MRN: [id number] Epilepsy Attending: Arlin KIDD Shelton Referring Physician/Provider: Merrianne Locus, MD Date: 03/07/2024 Duration: 41.54 mins Patient history: 68yo F with slurred speech, left-sided facial spasms and weakness. EEG to evaluate for seizure Level of alertness: Awake, asleep AEDs during EEG study: None Technical aspects: This EEG study was done with scalp electrodes positioned according to the 10-20 International system of electrode placement.  Electrical activity was reviewed with band pass filter of 1-70Hz , sensitivity of 7 uV/mm, display speed of 63mm/sec with a 60Hz  notched filter applied as appropriate. EEG data were recorded continuously and digitally stored.  Video monitoring was available and reviewed as appropriate. Description: The posterior dominant rhythm consists of 8-9 Hz activity of moderate voltage (25-35 uV) seen  predominantly in posterior head regions, symmetric and reactive to eye opening and eye closing. Sleep was characterized by vertex waves, sleep spindles (12 to 14 Hz), maximal frontocentral region. EEG showed continuous 3 to 6 Hz theta-delta slowing  in right temporo-parietal region. Hyperventilation and photic stimulation were not performed.   ABNORMALITY - Continuous slow,  right temporo-parietal region. IMPRESSION: This study is suggestive of cortical dysfunction arising from right temporo-parietal region likely secondary to underlying structural abnormality. No seizures or epileptiform discharges were seen throughout the recording. Please note lac of epileptiform activity during interictal EEG does not exclude the diagnosis of epilepsy. Arlin MALVA Krebs   ECHOCARDIOGRAM COMPLETE Result Date: 03/07/2024    ECHOCARDIOGRAM REPORT   Patient Name:   Tracy Jennings Date of Exam: 03/06/2024 Medical Rec #:  [id number]        Height:       63.0 in Accession #:    [id number]       Weight:       119.9 lb Date of Birth:  Jan 26, 1956        BSA:          1.556 m Patient Age:    68 years         BP:           177/86 mmHg Patient Gender: F                HR:           56 bpm. Exam Location:  [facility] Procedure: 2D Echo, Cardiac Doppler and Color Doppler (Both Spectral and Color            Flow Doppler were utilized during procedure). Indications:     TIA G45.9  History:         Patient has prior history of Echocardiogram examinations.                  Signs/Symptoms:Murmur.  Sonographer:     Bari Roar Referring Phys:  [id number] XILIN NIU Diagnosing Phys: Evalene Lunger MD  Sonographer Comments: Image acquisition challenging due to uncooperative patient. IMPRESSIONS  1. Left ventricular ejection fraction, by estimation, is 55 to 60%. The left ventricle has normal function. The left ventricle has no regional wall motion abnormalities. There is severe left ventricular hypertrophy. Left ventricular diastolic parameters  are consistent  with Grade II diastolic dysfunction (pseudonormalization).  2. Right ventricular systolic function is normal. The right ventricular size is normal. There is moderately elevated pulmonary artery systolic pressure. The estimated right ventricular systolic pressure is 46.2 mmHg.  3. The mitral valve is normal in structure. No evidence of mitral valve regurgitation. Mild mitral stenosis. The mean mitral valve gradient is 5.0 mmHg. Moderate mitral annular calcification.  4. Tricuspid valve regurgitation is mild to moderate.  5. The aortic valve is calcified. There is moderate calcification of the aortic valve. Aortic valve regurgitation is mild to moderate. Mild to moderate aortic valve stenosis. Aortic valve mean gradient measures 16.5 mmHg.  6. The inferior vena cava is normal in size with greater than 50% respiratory variability, suggesting right atrial pressure of 3 mmHg. FINDINGS  Left Ventricle: Left ventricular ejection fraction, by estimation, is 55 to 60%. The left ventricle has normal function. The left ventricle has no regional wall motion abnormalities. Strain was performed and the global longitudinal strain is indeterminate. The left ventricular internal cavity size was normal in size. There is severe left ventricular hypertrophy. Left ventricular diastolic parameters are consistent with Grade II diastolic dysfunction (pseudonormalization). Right Ventricle: The right ventricular size is normal. No increase in right ventricular wall thickness. Right ventricular systolic function is normal. There is moderately elevated pulmonary artery systolic pressure. The tricuspid regurgitant velocity is 3.21 m/s, and with an assumed right atrial pressure of 5 mmHg, the estimated right ventricular systolic pressure is 46.2 mmHg. Left Atrium: Left atrial size was normal in size. Right Atrium: Right atrial size was normal in size. Pericardium: There is no evidence of pericardial effusion. Mitral Valve: The mitral valve is  normal in structure. There is mild calcification of the mitral valve leaflet(s). Moderate mitral annular calcification. No evidence of mitral valve regurgitation. Mild mitral valve stenosis. MV peak gradient, 13.1 mmHg. The mean mitral valve gradient is 5.0 mmHg. Tricuspid Valve: The tricuspid valve is normal in structure. Tricuspid valve regurgitation is mild to moderate. No evidence of tricuspid stenosis. Aortic Valve: The aortic valve is calcified. There is moderate calcification of the aortic valve. Aortic valve regurgitation is mild to moderate. Aortic regurgitation PHT measures 485 msec. Mild to moderate aortic stenosis is present. Aortic valve mean gradient measures 16.5 mmHg. Aortic valve peak gradient measures 31.8 mmHg. Aortic valve area, by VTI measures 0.88 cm. Pulmonic Valve: The pulmonic valve was normal in structure. Pulmonic valve regurgitation is not visualized. No evidence of pulmonic stenosis. Aorta: The aortic root is normal in size and structure. Venous: The inferior vena cava is normal in size with greater than 50% respiratory variability, suggesting right atrial pressure of 3 mmHg. IAS/Shunts: No atrial level shunt detected by color flow Doppler. Additional Comments: 3D was performed not requiring image post processing on an independent workstation and was indeterminate. There is a small pleural effusion in the left lateral region.  LEFT VENTRICLE PLAX 2D LVIDd:         3.60 cm   Diastology LVIDs:         2.50 cm   LV e' medial:    4.57 cm/s LV PW:         1.70 cm   LV E/e' medial:  33.7 LV IVS:        1.60 cm   LV e' lateral:   5.00 cm/s LVOT diam:     1.70 cm   LV E/e' lateral: 30.8 LV SV:         52 LV SV Index:   34 LVOT Area:     2.27 cm  RIGHT VENTRICLE RV Basal diam:  3.30 cm RV Mid diam:    2.80 cm RV S prime:     11.00 cm/s TAPSE (M-mode): 2.1 cm LEFT ATRIUM             Index        RIGHT ATRIUM           Index LA diam:        4.00 cm 2.57 cm/m   RA Area:     16.30 cm LA Vol  (A2C):   59.0 ml 37.92 ml/m  RA Volume:   42.70 ml  27.44 ml/m LA Vol (A4C):   59.0 ml 37.92 ml/m LA Biplane Vol: 59.0 ml  37.92 ml/m  AORTIC VALVE                     PULMONIC VALVE AV Area (Vmax):    0.90 cm      PV Vmax:          1.04 m/s AV Area (Vmean):   0.84 cm      PV Peak grad:     4.3 mmHg AV Area (VTI):     0.88 cm      PR End Diast Vel: 8.53 msec AV Vmax:           282.00 cm/s   RVOT Peak grad:   2 mmHg AV Vmean:          187.000 cm/s AV VTI:            0.592 m AV Peak Grad:      31.8 mmHg AV Mean Grad:      16.5 mmHg LVOT Vmax:         112.00 cm/s LVOT Vmean:        69.300 cm/s LVOT VTI:          0.230 m LVOT/AV VTI ratio: 0.39 AI PHT:            485 msec  AORTA Ao Root diam: 2.80 cm Ao Asc diam:  2.80 cm MITRAL VALVE                TRICUSPID VALVE MV Area (PHT): 3.23 cm     TR Peak grad:   41.2 mmHg MV Area VTI:   1.15 cm     TR Vmax:        321.00 cm/s MV Peak grad:  13.1 mmHg MV Mean grad:  5.0 mmHg     SHUNTS MV Vmax:       1.81 m/s     Systemic VTI:  0.23 m MV Vmean:      105.0 cm/s   Systemic Diam: 1.70 cm MV Decel Time: 235 msec MV E velocity: 154.00 cm/s MV A velocity: 121.00 cm/s MV E/A ratio:  1.27 MV A Prime:    9.2 cm/s Evalene Lunger MD Electronically signed by Evalene Lunger MD Signature Date/Time: 03/07/2024/12:12:01 PM    Final    US  Carotid Bilateral (at [facility] and [facility] only) Result Date: 03/06/2024 CLINICAL DATA:  Transient ischemic attack EXAM: BILATERAL CAROTID DUPLEX ULTRASOUND TECHNIQUE: Elnor scale imaging, color Doppler and duplex ultrasound were performed of bilateral carotid and vertebral arteries in the neck. COMPARISON:  None Available. FINDINGS: Criteria: Quantification of carotid stenosis is based on velocity parameters that correlate the residual internal carotid diameter with NASCET-based stenosis levels, using the diameter of the distal internal carotid lumen as the denominator for stenosis measurement. The following velocity measurements were obtained: RIGHT ICA:  186 cm/sec CCA: 60 cm/sec SYSTOLIC ICA/CCA RATIO:  3.1 ECA: 112 cm/sec LEFT ICA: 103 cm/sec CCA: 166 cm/sec SYSTOLIC ICA/CCA RATIO:  0.6 ECA: 37 cm/sec RIGHT CAROTID ARTERY: There is no significant plaque in the proximal right internal carotid artery. An elevated velocity is measured in the distal right internal carotid artery. RIGHT VERTEBRAL ARTERY:  Antegrade LEFT CAROTID ARTERY:  There is no significant plaque. LEFT VERTEBRAL ARTERY:  Antegrade. IMPRESSION: 1. Right carotid system: An elevated velocity is measured within the distal right internal carotid artery which suggests a stenosis between 50 and 69%. There is no significant plaque identified and therefore this may be artifactual. Consideration can be given toward further evaluation by  CT angiogram for further characterization if clinically appropriate. 2. Left carotid system: No evidence of significant flow limiting stenosis. Electronically Signed   By: Maude Naegeli M.D.   On: 03/06/2024 12:47     Medications:     aspirin   300 mg Rectal Daily   Or   aspirin   325 mg Oral Daily   atorvastatin   40 mg Oral Daily   carvedilol   25 mg Oral BID WC   Chlorhexidine  Gluconate Cloth  6 each Topical Daily   Chlorhexidine  Gluconate Cloth  6 each Topical Q0600   cloNIDine   0.3 mg Oral TID   diltiazem   120 mg Oral Daily   doxazosin   4 mg Oral Daily   isosorbide  mononitrate  120 mg Oral Daily   losartan   100 mg Oral Daily   QUEtiapine   12.5 mg Oral TID   acetaminophen  **OR** acetaminophen  (TYLENOL ) oral liquid 160 mg/5 mL **OR** acetaminophen , labetalol , LORazepam , ondansetron  (ZOFRAN ) IV, senna-docusate, sodium chloride  flush  Assessment/ Plan:  [name] is a 68 y.o.  female with past medical conditions including dementia, hypertension, diabetes, hyperlipidemia, cervical cancer, recurrent pancreatitis, PVD, and end-stage renal disease on hemodialysis.  Patient presents to the emergency department after experiencing slurred speech,  left-sided facial spasms and weakness.  Patient has been admitted for TIA (transient ischemic attack) [G45.9] Hallucination [R44.3]  [facility]/MWF/Lt AVG    End-stage renal disease on hemodialysis.  Patient receiving dialysis treatment today, UF goal 0.5 to 1 L as tolerated.  Next treatment scheduled for Monday.  Transient ischemic attack, reporting symptoms of slurred speech, involuntary left-sided facial spasms and weakness.  Symptoms resolved.  Neurology consulted, TIA versus partial complex seizure.  Brain imaging negative for acute infarct.  Carotid ultrasound shows right-sided 50 to 60% stenosis without plaque.    3.  Hypertensive urgency, blood pressure severely elevated on admission.  Home regimen includes amlodipine , carvedilol , doxazosin , losartan , and isosorbide .  All were held initially however have been restarted.  Amlodipine  remains held and clonidine  has been added.  Blood pressure has improved some today, 155/78 with dialysis.  4. Secondary Hyperparathyroidism: with outpatient labs:   Lab Results  Component Value Date   CALCIUM  8.6 (L) 03/08/2024   PHOS 6.2 (H) 03/08/2024    Patient is prescribed calcium  acetate with meals outpatient.  Phosphorus level remains slightly elevated.  Will restart calcium  acetate with meals during this admission.    LOS: 1 Leor Whyte 6/27/202510:32 AM

## 2024-03-08 NOTE — Significant Event (Signed)
       CROSS COVER NOTE  NAME: Tracy Jennings MRN: [id number] DOB : [birth year] ATTENDING PHYSICIAN: Dezii, Alexandra, DO    Date of Service   03/08/2024   HPI/Events of Note   Message received from nurse via secure chat hello Tracy Jennings is confused and having visual hallucinations. she had her seroquel  is there anything else she could get   Interventions   Assessment/Plan:    03/08/2024    8:42 PM 03/08/2024    5:00 PM 03/08/2024    4:23 PM  Vitals with BMI  Systolic 197 168 803  Diastolic 75 76 87  Pulse 64 68 68   Bedside patient sitting in chair. Able to tell me her name birthdate and place. Synopsis of why she is here as well. She said she couldn't sleep cause there were people bothering her at night but it was not there nurses or staff, and she had a brush in her hand that was not present Demonstrating some impulsivity and agitation with nurse prior to my arrival New order of seroquel  started tonight at 86.5. additional 12.5 of seroquel  1:1 sitter        Tracy Jennings [facility] NP [facility] Cross Cover 7pm-7am - check amion for availability Pager [phone]

## 2024-03-08 NOTE — Plan of Care (Signed)
  Problem: Ischemic Stroke/TIA Tissue Perfusion: Goal: Complications of ischemic stroke/TIA will be minimized 03/08/2024 1722 by Val Michaelle SAILOR, RN Outcome: Progressing 03/08/2024 1010 by Val Michaelle SAILOR, RN Outcome: Progressing

## 2024-03-08 NOTE — Progress Notes (Signed)
 Mobility Specialist - Progress Note   03/08/24 1641  Mobility  Activity Ambulated with assistance in hallway;Transferred from bed to chair  Level of Assistance Minimal assist, patient does 75% or more  Assistive Device Front wheel walker  Distance Ambulated (ft) 20 ft  Activity Response Tolerated fair  Mobility visit 1 Mobility     Pt transferring chair-bed-chair on arrival, utilizing RA. Pt confused and a little agitated on arrival. Pt reporting back pain. Redirection attempt to allow pt to ambulate in hallway with author, pt agreeable. Pt initially declined use of RW d/t it not being hers, but pt agreeable with encouragement. Continued ambulation into hallway with minA. Hand-over-hand assist for hand placement. RW negotiation to avoid obstacle collision. 1 LOB corrected with minA. Pt appears to have difficulty seeing at times---specifically on the L. Pt resting on forearms at times d/t pain. Pt returned to chair with alarm set, needs in reach. Pillow wedge support and warm blanket provided for comfort. Family at bedside made aware to notify nursing staff prior to exit; family showed understanding. RN notified.    Lennette Seip Mobility Specialist 03/08/24, 4:59 PM

## 2024-03-08 NOTE — Progress Notes (Signed)
 Physical Therapy Treatment Patient Details Name: Tracy Jennings MRN: [id number] DOB: Oct 17, 1955 Today's Date: 03/08/2024   History of Present Illness Pt is a 68 year old female presents with slurred speech,  left facial spasm, left sided weakness.    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 Pt required constant multimodal cues for hand placement and to stay on task throughout ambulation. Pt required physical assistance to guide RW during ambulation, per below. Pt required a rest break in between bouts of ambulation due to c/o LBP. Pt reported no adverse symptoms during the session, but refused have SPO2 measured via pulse ox, but HR remained WNL (measured via telemetry) throughout on room air. Pt requires constant multimodal cues for all mobility tasks to ensure patient safety. Pt will benefit from continued PT services upon discharge to safely address deficits listed in patient problem list for decreased caregiver assistance and eventual return to PLOF.   If plan is discharge home, recommend the following: A little help with walking and/or transfers;A little help with bathing/dressing/bathroom;Assistance with cooking/housework;Assist for transportation;Help with stairs or ramp for entrance;Supervision due to cognitive status   Can travel by private vehicle        Equipment Recommendations  Rolling walker (2 wheels)    Recommendations for Other Services       Precautions / Restrictions Precautions Precautions: Fall Recall of Precautions/Restrictions: Impaired Restrictions Weight Bearing Restrictions Per Provider Order: No     Mobility  Bed Mobility Overal bed mobility: Needs Assistance Bed Mobility: Supine to Sit,  Sit to Supine     Supine to sit: Supervision Sit to supine: Supervision   General bed mobility comments: Pt required supervision for all bed mobility tasks with constant VC's for rolling direction    Transfers Overall transfer level: Needs assistance Equipment used: Rolling walker (2 wheels) Transfers: Sit to/from Stand Sit to Stand: Contact guard assist           General transfer comment: Pt required multimodal cues for hand placement to push off from EOB to complete STS with RW, but no physical assistance required    Ambulation/Gait Ambulation/Gait assistance: Min assist Gait Distance (Feet): 45 Feet x 2 Assistive device: Rolling walker (2 wheels) Gait Pattern/deviations: Trunk flexed, Shuffle, Step-through pattern, Decreased stride length, Knee flexed in stance - left, Knee flexed in stance - right, Drifts right/left Gait velocity: decreased     General Gait Details: Pt required min A to control RW and prevent it from running into walls/chairs in room and hallway. Pt required tactile cues for proper hand placement on RW before beginning ambulation. Throughout ambulation, pt tended to push RW ahead beyond BOS and constant VC's were utilized to encourage keeping RW within BOS with no pt carryover. Pt demonstrated decreased foot clearance and heel strike bilaterally and required constant min A to guide RW to prevent going into walls or chairs. Pt required constant VC's to keep on task and to guide in which direction to walk in, as pt is easily distracted and would tend to drift away from established path.   Stairs             Wheelchair Mobility     Tilt Bed    Modified Rankin (Stroke Patients Only)  Balance Overall balance assessment: Needs assistance Sitting-balance support: Feet supported Sitting balance-Leahy Scale: Good     Standing balance support: Bilateral upper extremity supported, During functional activity, Reliant on assistive device for  balance Standing balance-Leahy Scale: Fair Standing balance comment: Pt is able to maintain balance in stance and ambulation with RW, but requiring constant VC's to keep RW within BOS, due to cognitive status.                            Communication Communication Communication: No apparent difficulties  Cognition Arousal: Alert Behavior During Therapy: Impulsive, Agitated   PT - Cognitive impairments: Difficult to assess                       PT - Cognition Comments: Pt required constant multimodal cues to stay on task and what direction to ambulate in Following commands: Impaired Following commands impaired: Follows one step commands inconsistently    Cueing Cueing Techniques: Verbal cues, Gestural cues, Tactile cues, Visual cues  Exercises      General Comments        Pertinent Vitals/Pain Pain Assessment Pain Assessment: 0-10 Pain Score: 6  Pain Location: Low back Pain Descriptors / Indicators: Constant Pain Intervention(s): Monitored during session    Home Living                          Prior Function            PT Goals (current goals can now be found in the care plan section) Progress towards PT goals: PT to reassess next treatment    Frequency    Min 2X/week      PT Plan      Co-evaluation              AM-PAC PT 6 Clicks Mobility   Outcome Measure  Help needed turning from your back to your side while in a flat bed without using bedrails?: None Help needed moving from lying on your back to sitting on the side of a flat bed without using bedrails?: None Help needed moving to and from a bed to a chair (including a wheelchair)?: A Little Help needed standing up from a chair using your arms (e.g., wheelchair or bedside chair)?: A Little Help needed to walk in hospital room?: A Little Help needed climbing 3-5 steps with a railing? : A Lot 6 Click Score: 19    End of Session Equipment Utilized During  Treatment: Gait belt Activity Tolerance: Patient tolerated treatment well Patient left: with bed alarm set;with call bell/phone within reach;in bed Nurse Communication: Mobility status PT Visit Diagnosis: Muscle weakness (generalized) (M62.81);Difficulty in walking, not elsewhere classified (R26.2)     Time: 8644-8584 PT Time Calculation (min) (ACUTE ONLY): 20 min  Charges:                            Leontine Ingles, SPT 03/08/24, 4:41 PM

## 2024-03-09 DIAGNOSIS — G459 Transient cerebral ischemic attack, unspecified: Secondary | ICD-10-CM | POA: Diagnosis not present

## 2024-03-09 LAB — GLUCOSE, CAPILLARY: Glucose-Capillary: 100 mg/dL — ABNORMAL HIGH (ref 70–99)

## 2024-03-09 MED ORDER — HYDRALAZINE HCL 20 MG/ML IJ SOLN
10.0000 mg | Freq: Once | INTRAMUSCULAR | Status: AC
  Administered 2024-03-09: 10 mg via INTRAVENOUS
  Filled 2024-03-09: qty 1

## 2024-03-09 MED ORDER — HYDRALAZINE HCL 20 MG/ML IJ SOLN: 20.0000 mg | Freq: Once | INTRAMUSCULAR | Status: DC

## 2024-03-09 MED ORDER — QUETIAPINE FUMARATE 25 MG PO TABS
25.0000 mg | ORAL_TABLET | Freq: Three times a day (TID) | ORAL | Status: DC
  Administered 2024-03-09 – 2024-04-02 (×54): 25 mg via ORAL
  Filled 2024-03-09 (×60): qty 1

## 2024-03-09 MED ORDER — QUETIAPINE FUMARATE 25 MG PO TABS
25.0000 mg | ORAL_TABLET | Freq: Three times a day (TID) | ORAL | Status: DC | PRN
  Administered 2024-03-14: 25 mg via ORAL
  Filled 2024-03-09 (×2): qty 1

## 2024-03-09 NOTE — Progress Notes (Signed)
 AT 2214 [name] aware that patient was having visual hallucinations and confused. Tracy Jennings came to bedside and ordered sitter and Seroquel  12.5.

## 2024-03-09 NOTE — Consult Note (Signed)
 [facility] Follow up  Patient Name: .DECARLA SIEMEN  MRN: [id number]  DOB: 30-Aug-1956  Consult Order details:  Orders (From admission, onward)     Start     Ordered   03/07/24 0935  IP CONSULT TO PSYCHIATRY       Ordering Provider: Leesa Kast, DO  Provider:  (Not yet assigned)  Question Answer Comment  Location [facility]   Reason for Consult? Capacity eval to leave [facility]      03/07/24 0934             Mode of Visit: In person    Psychiatry Consult Evaluation  Service Date: March 09, 2024 LOS:  LOS: 2 days  Chief Complaint Capacity eval  Primary Psychiatric Diagnoses  Delirium due to multiple etiologies- uremia 2.   3.    Assessment  Vandora D Outten is a 68 y.o. female admitted: Medicallyfor 03/05/2024  5:50 PM  with ESRD on HD MWF, HTN, HLD, DM, PVD, dementia, chronic recurrent pancreatitis, stroke, cervical cancer, HIT, who presents with slurred speech, left facial spasm, left-sided weakness.  Patient had syncopal episode on 6/23 and was seen in the ED.  At that time she was found to have O2 desaturation to 80% due to pulmonary edema on chest x-ray as well as hyperkalemia with a potassium of 6.2.  She had negative head CT during that admission.  She received hemodialysis and then was discharged home.  Her last known normal was 6/24 at 1700.  Upon arrival to the ED patient's confusion had resolved entirely.  Labs in the ED mostly unremarkable, potassium 4.6, creatinine consistent with ESRD.  Blood pressure was found to be 252/79.  Patient was placed in telemetry bed for observation.  Code stroke was called.Blood pressure resolved.  Head CT was without acute intracranial abnormality.  Chronic infarct seen on right parietal lobe and extensive chronic microvascular ischemic changes with remote lacunar infarcts in the right caudate and right thalamus appreciated.  Patient was admitted for workup of presumed TIA.  LDL 96, hemoglobin A1c  5.1%.  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  MRI brain revealed chronic encephalomalacia within the right parietal lobe and advanced diffuse cerebral white matter disease.  Patient underwent carotid Dopplers which revealed elevated velocity within the distal right internal carotid artery suggesting stenosis of 50 to 69% though no significant plaque identified and thus this may be artifactual. Patient underwent hemodialysis on 6/25 but left her session early AMA.  When she returned to the room she reported that she wanted to leave AGAINST MEDICAL ADVICE. Family expressed significant concerns about patient signing AMA and patient reportedly couldn't even sign the paper properly. Psychiatry is consulted to evaluate for capacity due to concerns of delirium.   03/09/24: Patient has ongoing visual hallucinations of little children playing around. Given the psychosis it is our clinical opinion that patient lacks capacity to make that medical decision of discontinuing treatment and going home at this time. Increased Seroquel  to 25mg  3 times daily to help with the visual hallucinations in the context of delirium.  Will recommend to monitor for orthostatic hypotension/dizziness/low blood pressure with Seroquel .  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  Given the psychosis, altered mental status patient is unable to make a reasonable decision at this time.  It is our clinical opinion that patient lacks capacity as of now to make the decision of leaving AMA.  Will recommend to reach out to family members for surrogate decision making.  ## Further Work-up:  -- Patient is seen and followed by neurology  -   ## Disposition:-- There are no psychiatric contraindications to discharge at this time  ## Behavioral / Environmental: -Delirium Precautions: Delirium Interventions for Nursing and Staff: - RN to open blinds every AM. - To Bedside: Glasses, hearing aide, and pt's own shoes. Make available to patients. when possible and encourage use. - Encourage po fluids when appropriate, keep fluids within reach. - OOB to chair with meals. - Passive ROM exercises to all extremities with AM & PM care. - RN to assess orientation to person, time and place QAM and PRN. - Recommend extended visitation hours with familiar family/friends as feasible. - Staff to minimize disturbances at night. Turn off television when pt asleep or when not in use.    ## Safety and Observation Level:  - Based on my clinical evaluation, I estimate the patient to be at low risk of self harm in the current setting. - At this time, we recommend  routine. This decision is based on my review of the chart including patient's history and current presentation, interview of the patient, mental status examination, and consideration of suicide risk including evaluating suicidal ideation, plan, intent, suicidal or self-harm behaviors, risk factors, and protective factors. This judgment is based on our ability to directly address suicide risk, implement suicide prevention strategies, and develop a safety plan while the patient is in the clinical setting. Please contact our team if there is a concern that risk level has changed.  CSSR Risk  Category:C-SSRS RISK CATEGORY: No Risk  Suicide Risk Assessment: Patient has following modifiable risk factors for suicide: None identified at this time Patient has following non-modifiable or demographic risk factors for suicide: None identified at this time Patient has the following protective factors against suicide: Supportive family and Cultural, spiritual, or religious beliefs that discourage suicide  Thank you for this consult request. Recommendations have been communicated to the primary team.  We will continue to follow-up at this time.   Allyn Foil, MD       History of Present Illness  Lanique Gonzalo Mehan is a 68 y.o. female admitted: Medicallyfor 03/05/2024  5:50 PM  with ESRD on HD MWF, HTN, HLD, DM, PVD, dementia, chronic recurrent pancreatitis, stroke, cervical cancer, HIT, who presents with slurred speech, left facial spasm, left-sided weakness.  Patient had syncopal episode on 6/23 and was seen in the ED.  At that time she was found to have O2 desaturation to 80% due to pulmonary edema on chest x-ray as well as hyperkalemia with a potassium of 6.2.  She had negative head CT during that admission.  She received hemodialysis and then was discharged home.  Her last known normal was 6/24 at 1700.  Upon arrival to the ED patient's confusion had  resolved entirely.  Labs in the ED mostly unremarkable, potassium 4.6, creatinine consistent with ESRD.  Blood pressure was found to be 252/79.  Patient was placed in telemetry bed for observation.  Code stroke was called.Blood pressure resolved.  Head CT was without acute intracranial abnormality.  Chronic infarct seen on right parietal lobe and extensive chronic microvascular ischemic changes with remote lacunar infarcts in the right caudate and right thalamus appreciated.  Patient was admitted for workup of presumed TIA.  LDL 96, hemoglobin A1c 5.1%.  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  MRI brain revealed chronic  encephalomalacia within the right parietal lobe and advanced diffuse cerebral white matter disease.  Patient underwent carotid Dopplers which revealed elevated velocity within the distal right internal carotid artery suggesting stenosis of 50 to 69% though no significant plaque identified and thus this may be artifactual. Patient underwent hemodialysis on 6/25 but left her session early AMA.  When she returned to the room she reported that she wanted to leave AGAINST MEDICAL ADVICE. Family expressed significant concerns about patient signing AMA and patient reportedly couldn't even sign the paper properly. Psychiatry is consulted to evaluate for capacity due to concerns of delirium.  03/09/24: Patient is noted to be resting in bed.  She is noted to be drowsy.  She did acknowledge that probably she got extra medication.  She continues to report the little children in the room playing around.  She talks about the little boy staying in the room probably missed his school today.  She reports that the children did not bother her for some time yesterday but that back today.  She denies hearing them talk.  She denies SI/HI/plan.  She reports fair appetite and sleep. Psychiatric and Social History  Psychiatric History:  Information collected from patient  Prev Dx/Sx: denies Current Psych Provider: none reported Home Meds (current): none reported Previous Med Trials: denies Therapy: denies  Prior Psych Hospitalization: denies  Prior Self Harm: denies Prior Violence: denies  Family Psych History: denies Family Hx suicide: denies  Social History:   Educational Hx: HS Occupational Hx: disability Legal Hx: denies Living Situation: lives by herself but has family support Spiritual Hx: denies Access to weapons/lethal means: denies   Substance History Alcohol: denies  Tobacco: denies Illicit drugs: denies Prescription drug abuse: denies Rehab hx: denies  Exam Findings  Physical Exam: Reviewed  and agree with the physical exam findings Vital Signs:  Temp:  [96.6 F (35.9 C)-98.4 F (36.9 C)] 98 F (36.7 C) (06/28 1507) Pulse Rate:  [62-75] 62 (06/28 1702) Resp:  [16-18] 18 (06/28 1507) BP: (157-239)/(55-89) 167/55 (06/28 1702) SpO2:  [95 %-100 %] 95 % (06/28 1507) Blood pressure (!) 167/55, pulse 62, temperature 98 F (36.7 C), temperature source Oral, resp. rate 18, height 5' 3 (1.6 m), weight 57.8 kg, SpO2 95%. Body mass index is 22.57 kg/m.    Mental Status Exam: General Appearance: Casual  Orientation:  Other:  to self,month, year  Memory:  Immediate;   Fair Recent;   Poor Remote;   Poor  Concentration:  Concentration: Poor and Attention Span: Poor  Recall:  Fair  Attention  Poor  Eye Contact:  Minimal  Speech:  Normal Rate  Language:  Fair  Volume:  Normal  Mood: fine  Affect:  Appropriate  Thought Process:  Disorganized  Thought Content:  Illogical and Hallucinations: Visual  Suicidal Thoughts:  No  Homicidal Thoughts:  No  Judgement:  Impaired  Insight:  Shallow  Psychomotor Activity:  Normal  Akathisia:  No  Fund of Knowledge:  Fair      Assets:  Communication Skills Desire for Improvement Housing Resilience  Cognition:  Impaired,  Mild  ADL's:  Intact  AIMS (if indicated):        Other History   These have been pulled in through the EMR, reviewed, and updated if appropriate.  Family History:  The patient's family history includes Cancer in her father; Diabetes in her maternal grandfather, maternal grandmother, paternal grandfather, paternal grandmother, and sister; Gout in her mother; Hypertension in her mother; Stroke in her mother.  Medical History: Past Medical History:  Diagnosis Date   Anemia 04/2018   low iron. to be started on supplements   Cervical cancer (HCC)    CKD (chronic kidney disease)    Stage IV   Complication of anesthesia    receceived too much anesthesia, that she was in coma for a couple days    COVID-19 virus  detected 10/28/2019   Diabetes mellitus without complication (HCC)    type II   ESRD (end stage renal disease) (HCC)    Heart murmur    followed as a child only   HSIL (high grade squamous intraepithelial lesion) on Pap smear of cervix    Hyperlipidemia associated with type 2 diabetes mellitus (HCC)    Hypertension    Pancreatitis    Peripheral vascular disease (HCC)     Surgical History: Past Surgical History:  Procedure Laterality Date   AMPUTATION TOE Left 2013   2nd toe. tip of toe (toe nail was infected)   AV FISTULA PLACEMENT Left 05/11/2018   Procedure: ARTERIOVENOUS (AV) FISTULA CREATION;  Surgeon: Jama Cordella MATSU, MD;  Location: [facility];  Service: Vascular;  Laterality: Left;   CATARACT EXTRACTION     CERVICAL CONIZATION W/BX N/A 04/08/2020   Procedure: CONIZATION CERVIX WITH BIOPSY;  Surgeon: Mancil Barter, MD;  Location: [facility];  Service: Gynecology;  Laterality: N/A;   CHOLECYSTECTOMY  2014   COLONOSCOPY     COLONOSCOPY WITH PROPOFOL  N/A 01/10/2018   Procedure: COLONOSCOPY WITH PROPOFOL ;  Surgeon: Toledo, Ladell POUR, MD;  Location: [facility];  Service: Gastroenterology;  Laterality: N/A;   DIALYSIS/PERMA CATHETER INSERTION N/A 05/21/2018   Procedure: DIALYSIS/PERMA CATHETER INSERTION;  Surgeon: Marea Selinda RAMAN, MD;  Location: [facility];  Service: Cardiovascular;  Laterality: N/A;   DIALYSIS/PERMA CATHETER INSERTION N/A 07/14/2020   Procedure: DIALYSIS/PERMA CATHETER INSERTION;  Surgeon: Marea Selinda RAMAN, MD;  Location: [facility];  Service: Cardiovascular;  Laterality: N/A;   DIALYSIS/PERMA CATHETER REMOVAL N/A 04/11/2019   Procedure: DIALYSIS/PERMA CATHETER REMOVAL;  Surgeon: Marea Selinda RAMAN, MD;  Location: [facility];  Service: Cardiovascular;  Laterality: N/A;   EYE SURGERY Bilateral 2018   cataract extractions   IR IMAGING GUIDED PORT INSERTION  06/05/2020   THROMBECTOMY W/ EMBOLECTOMY  05/11/2018   Procedure: THROMBECTOMY ARTERIOVENOUS  FISTULA;  Surgeon: Jama Cordella MATSU, MD;  Location: [facility];  Service: Vascular;;   TUBAL LIGATION  1984     Medications:   Current Facility-Administered Medications:    acetaminophen  (TYLENOL ) tablet 650 mg, 650 mg, Oral, Q4H PRN, 650 mg at 03/08/24 1716 **OR** acetaminophen  (TYLENOL ) 160 MG/5ML solution 650 mg, 650 mg, Per Tube, Q4H PRN **OR** acetaminophen  (TYLENOL ) suppository 650 mg, 650 mg, Rectal, Q4H PRN, Niu, Xilin, MD   aspirin  suppository 300 mg, 300 mg, Rectal, Daily **OR** aspirin  tablet 325 mg, 325 mg, Oral, Daily,  Niu, Xilin, MD, 325 mg at 03/09/24 0848   atorvastatin  (LIPITOR) tablet 40 mg, 40 mg, Oral, Daily, Niu, Xilin, MD, 40 mg at 03/09/24 0847   calcium  acetate (PHOSLO ) capsule 667 mg, 667 mg, Oral, TID WC, Breeze, Shantelle, NP, 667 mg at 03/09/24 1728   carvedilol  (COREG ) tablet 25 mg, 25 mg, Oral, BID WC, Breeze, Shantelle, NP, 25 mg at 03/09/24 1728   Chlorhexidine  Gluconate Cloth 2 % PADS 6 each, 6 each, Topical, Daily, Dezii, Alexandra, DO, 6 each at 03/09/24 1655   Chlorhexidine  Gluconate Cloth 2 % PADS 6 each, 6 each, Topical, Q0600, Druscilla Bald, NP, 6 each at 03/08/24 0630   cloNIDine  (CATAPRES ) tablet 0.3 mg, 0.3 mg, Oral, TID, Dezii, Alexandra, DO, 0.3 mg at 03/09/24 1727   diltiazem  (CARDIZEM  CD) 24 hr capsule 120 mg, 120 mg, Oral, Daily, Breeze, Shantelle, NP, 120 mg at 03/09/24 0849   doxazosin  (CARDURA ) tablet 4 mg, 4 mg, Oral, Daily, Breeze, Shantelle, NP, 4 mg at 03/09/24 0849   hydrALAZINE  (APRESOLINE ) injection 20 mg, 20 mg, Intravenous, Once, Jesus America, NP   isosorbide  mononitrate (IMDUR ) 24 hr tablet 120 mg, 120 mg, Oral, Daily, Dezii, Alexandra, DO, 120 mg at 03/09/24 0847   labetalol  (NORMODYNE ) injection 10 mg, 10 mg, Intravenous, Q2H PRN, Dezii, Alexandra, DO, 10 mg at 03/09/24 1035   LORazepam  (ATIVAN ) injection 2 mg, 2 mg, Intravenous, Q2H PRN, Niu, Xilin, MD   losartan  (COZAAR ) tablet 100 mg, 100 mg, Oral, Daily, Breeze,  Shantelle, NP, 100 mg at 03/09/24 1728   ondansetron  (ZOFRAN ) injection 4 mg, 4 mg, Intravenous, Q8H PRN, Niu, Xilin, MD, 4 mg at 03/06/24 9189   QUEtiapine  (SEROQUEL ) tablet 25 mg, 25 mg, Oral, TID, Sidonia Nutter, MD, 25 mg at 03/09/24 1728   QUEtiapine  (SEROQUEL ) tablet 25 mg, 25 mg, Oral, Q8H PRN, Emeline Simpson, MD   senna-docusate (Senokot-S) tablet 1 tablet, 1 tablet, Oral, QHS PRN, Niu, Xilin, MD   sodium chloride  flush (NS) 0.9 % injection 10-40 mL, 10-40 mL, Intracatheter, PRN, Dezii, Alexandra, DO  Allergies: Allergies  Allergen Reactions   Amlodipine  Swelling    Knees down to ankles Knees down to ankles   Hctz [Hydrochlorothiazide]     pancreatitis   Heparin  Other (See Comments)    Hx  HIT,   Pt reports cardiac arrest when given heparin    Lmw Heparin      Reports cardiac arrest when given heparin    Other Other (See Comments)    Reports cardiac arrest when given during surgery Reports cardiac arrest when given during surgery Reports cardiac arrest when given during surgery    Sulfa Antibiotics Rash   Lactose    Dairy Aid [Tilactase]     Runny nose   Hydralazine      Nausea and rash    Allyn Foil, MD

## 2024-03-09 NOTE — Progress Notes (Signed)
 PROGRESS NOTE    Tracy Jennings  FMW:[id number] DOB: [birth year] DOA: 03/05/2024 PCP: Zachary Idelia LABOR, MD  Chief Complaint  Patient presents with   Code Stroke    Hospital Course:  This patient is a 69 year old female with ESRD on HD MWF, HTN, HLD, DM, PVD, dementia, chronic recurrent pancreatitis, stroke, cervical cancer, HIT, who presents with slurred speech, left facial spasm, left-sided weakness.  Patient had syncopal episode on 6/23 and was seen in the ED.  At that time she was found to have O2 desaturation to 80% due to pulmonary edema on chest x-ray as well as hyperkalemia with a potassium of 6.2.  She had negative head CT during that admission.  She received hemodialysis and then was discharged home.  Her last known normal was 6/24 at 1700.  Upon arrival to the ED patient's confusion had resolved entirely.  Labs in the ED mostly unremarkable, potassium 4.6, creatinine consistent with ESRD.  Blood pressure was found to be 252/79.  Patient was placed in telemetry bed for observation.  Code stroke was called.Blood pressure resolved.  Head CT was without acute intracranial abnormality.  Chronic infarct seen on right parietal lobe and extensive chronic microvascular ischemic changes with remote lacunar infarcts in the right caudate and right thalamus appreciated.  Patient was admitted for workup of presumed TIA.  LDL 96, hemoglobin A1c 5.1%.  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  MRI brain revealed chronic encephalomalacia within the right parietal lobe and advanced diffuse cerebral white matter disease.  Patient underwent carotid Dopplers which revealed elevated velocity within the distal right internal carotid artery suggesting stenosis of 50 to 69% though no significant plaque identified and thus this may be artifactual. Patient underwent hemodialysis on 6/25 but left her session early AMA.  When she returned to the room she reported that she wanted to leave  AGAINST MEDICAL ADVICE.  I had extensive discussion with the patient at bedside.  Also present for this discussion was nurse, Camillia, and patient's 2 sisters.  At the time my evaluation patient was able to demonstrate medical capacity.  She was alert and oriented x 4 and appropriately repeated my concerns believing the hospital. However, when patient attempted to sign AMA paperwork her signature was illegible.  She began getting upset and responding to internal stimuli.  Her confusion increased to the point that it was no longer safe to discharge.  Psychiatry was consulted.  Psychiatry met with the patient on 6/26 and confirms acute delirium, currently the patient does not have capacity to make medical decisions.  She has been started on Seroquel .    Subjective: Patient had acute episode of delirium overnight for which she received an additional dose of Seroquel .  On my evaluation she is drowsy but arousable.  Denies any active hallucinations.  No issues throughout the day today.   Objective: Vitals:   03/09/24 0544 03/09/24 0832 03/09/24 1004 03/09/24 1507  BP: (!) 157/83 (!) 192/89 (!) 186/81 (!) 159/58  Pulse: 75 71  64  Resp:  18  18  Temp:  98.4 F (36.9 C)  98 F (36.7 C)  TempSrc:  Oral  Oral  SpO2:  96%  95%  Weight:      Height:       No intake or output data in the 24 hours ending 03/09/24 1620  Filed Weights   03/06/24 1228 03/08/24 0733 03/08/24 1122  Weight: 54.4 kg 58.8 kg 57.8 kg   Examination: General  exam: Appears calm and comfortable, NAD  Respiratory system: No work of breathing, symmetric chest wall expansion Cardiovascular system: S1 & S2 heard, RRR.  Gastrointestinal system: Abdomen is nondistended, soft and nontender.  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  Throughout the stay her clinical picture is more consistent with acute delirium likely with underlying dementia. - MRA: Moderate stenosis of proximal basilar artery at 50% - MRI brain: Chronic encephalomalacia with the right parietal lobe and advanced diffuse cerebral white matter disease - Carotid Dopplers: Elevated velocity within the distal right internal carotid artery suggesting stenosis of 50 to 69% though no significant plaque identified and thus may be artifactual. - EEG: Cortical dysfunction arising from right temporal parietal region secondary to underlying structural abnormality.  No seizures or epileptiform discharges. - Echocardiogram: EF 55 to 60%, no regional wall motion abnormalities, severe left ventricular hypertrophy, grade 2 diastolic dysfunction elevated pulmonary arterial systolic pressures.  Calcification of aortic valve - Continue statin, continue aspirin . - No events on telemetry.  Will discontinue further monitoring - Lipids and A1c at goal. - Neurology consulted.  Appreciate recommendations  Dementia with behavioral disturbance - Remote history of dementia. No outpatient work up. - Mentation and capacity changing regularly - Psychiatry consulted, has recommended initiation of Seroquel .  Required additional dose of Seroquel  overnight.  May require increase of 3 times daily dosing if she has persistent issues.  More stable throughout the day today. - Have discussed with neurology who suspects Lewy body dementia is playing a role and recommends avoiding antipsychotics -  Please see psychiatry notes for more details - Presently patient does not have capacity to make medical decisions or leave AMA.  We have significant concerns about her ability to take care of herself at home.  She will likely require placement for safety  Hypertensive urgency - Systolic blood pressure severely elevated above 200. - Resume all home meds (patient listed on amlodipine  but also has allergy.  On alternative CCB.) - Continue to titrate daily - Blood pressure much improved with a systolic in the 150s today.  Continue with clonidine  and Imdur  at current doses - Not taking medication consistently at home.  Likely complicated by her dementia.  Pulmonary arterial hypertension Grade 2 diastolic dysfunction - As seen on echocardiogram - Continue GDMT - Tight blood pressure control as able - Remains clinically euvolemic, volume to be removed via dialysis as needed  ESRD Electrolyte disturbance - On HD MWF - Nephrology consulted during this admission, continue with dialysis per renal  Elevated troponin - No chest pain, no shortness of breath.  Troponin 41 -> 43. likely secondary to demand ischemia and ESRD - Continue aspirin  and Lipitor  Type 2 diabetes with renal manifestations - Recent hemoglobin A1c 5.1%, well-controlled.  Not currently taking medications - Daily CBG  Anemia of chronic disease, ESRD - Trend CBC. - Hemoglobin currently stable  Hyperlipidemia - Statin  Heparin -induced thrombocytopenia - Avoid heparin  or Lovenox  products for now -  SCDs as needed  DVT prophylaxis: SCDs   Code Status: Full Code Disposition:  Inpatient, needs placement. TOC consulted to arrange Consultants:    Procedures:    Antimicrobials:  Anti-infectives (From admission, onward)    None       Data Reviewed: I have personally reviewed following labs and imaging studies CBC: Recent Labs  Lab 03/04/24 1315 03/05/24 1810 03/08/24 0802  WBC 5.6 4.3 4.0  NEUTROABS 4.4 2.8   --   HGB 10.8* 10.9* 10.2*  HCT 34.0* 33.9* 31.0*  MCV 94.2 94.2 92.5  PLT 129* 150 144*   Basic Metabolic Panel: Recent Labs  Lab 03/04/24 1239 03/05/24 1810 03/08/24 0802  NA 137 135 133*  K 6.2* 4.6 4.4  CL 100 95* 94*  CO2 23 26 27   GLUCOSE 80 100* 120*  BUN 72* 47* 42*  CREATININE 9.67* 7.33* 7.84*  CALCIUM  8.7* 9.2 8.6*  PHOS  --   --  6.2*   GFR: Estimated Creatinine Clearance: 5.8 mL/min (A) (by C-G formula based on SCr of 7.84 mg/dL (H)). Liver Function Tests: Recent Labs  Lab 03/04/24 1239 03/05/24 1810 03/08/24 0802  AST 26 23  --   ALT 16 14  --   ALKPHOS 30* 29*  --   BILITOT 0.9 0.7  --   PROT 7.2 6.9  --   ALBUMIN 3.7 3.5 3.3*   CBG: Recent Labs  Lab 03/07/24 0724 03/09/24 0808  GLUCAP 112* 100*    No results found for this or any previous visit (from the past 240 hours).   Radiology Studies: No results found.   Scheduled Meds:  aspirin   300 mg Rectal Daily   Or   aspirin   325 mg Oral Daily   atorvastatin   40 mg Oral Daily   calcium  acetate  667 mg Oral TID WC   carvedilol   25 mg Oral BID WC   Chlorhexidine  Gluconate Cloth  6 each Topical Daily   Chlorhexidine  Gluconate Cloth  6 each Topical Q0600   cloNIDine   0.3 mg Oral TID   diltiazem   120 mg Oral Daily   doxazosin   4 mg Oral Daily   hydrALAZINE   20 mg Intravenous Once   isosorbide  mononitrate  120 mg Oral Daily   losartan   100 mg Oral Daily   QUEtiapine   25 mg Oral TID   Continuous Infusions:   LOS: 2 days  MDM: Patient is high risk for one or more organ failure.  They necessitate ongoing hospitalization for continued IV therapies and subsequent lab monitoring. Total time spent interpreting labs and vitals, reviewing the medical record, coordinating care amongst consultants and care team members, directly assessing and discussing care with the patient and/or family: 55 min  Torez Beauregard, DO [facility]  To contact the attending physician between 7A-7P please use  Epic Chat. To contact the covering physician during after hours 7P-7A, please review Amion.  03/09/2024, 4:20 PM   *This document has been created with the assistance of dictation software. Please excuse typographical errors. *

## 2024-03-09 NOTE — Progress Notes (Signed)
 [facility]  ROUNDING NOTE   Subjective:   Tracy Jennings is a 68 year old female with past medical conditions including dementia, hypertension, diabetes, hyperlipidemia, cervical cancer, recurrent pancreatitis, PVD, and end-stage renal disease on hemodialysis.  Patient presents to the emergency department after experiencing slurred speech, left-sided facial spasms and weakness.  Patient has been admitted for TIA (transient ischemic attack) [G45.9] Hallucination [R44.3]  Patient is known to our practice and receives outpatient dialysis treatments at [facility] on a MWF schedule, supervised by Dr. Dennise.   Update: Patient sitting up in bed Eating breakfast Alert and oriented to self   Objective:  Vital signs in last 24 hours:  Temp:  [96.6 F (35.9 C)-98.8 F (37.1 C)] 98.4 F (36.9 C) (06/28 0832) Pulse Rate:  [64-75] 71 (06/28 0832) Resp:  [11-21] 18 (06/28 0832) BP: (157-239)/(51-89) 186/81 (06/28 1004) SpO2:  [94 %-100 %] 96 % (06/28 0832) Weight:  [57.8 kg] 57.8 kg (06/27 1122)  Weight change:  Filed Weights   03/06/24 1228 03/08/24 0733 03/08/24 1122  Weight: 54.4 kg 58.8 kg 57.8 kg    Intake/Output: I/O last 3 completed shifts: In: 0  Out: 1000 [Other:1000]   Intake/Output this shift:  No intake/output data recorded.  Physical Exam: General: NAD  Head: Normocephalic, atraumatic. Moist oral mucosal membranes  Eyes: Anicteric  Neck: Supple  Lungs:  Clear to auscultation  Heart: Regular rate and rhythm  Abdomen:  Soft, nontender  Extremities: No peripheral edema.  Neurologic: Awake, alert, conversant  Skin: Warm,dry, no rash  Access: Left aVF    Basic Metabolic Panel: Recent Labs  Lab 03/04/24 1239 03/05/24 1810 03/08/24 0802  NA 137 135 133*  K 6.2* 4.6 4.4  CL 100 95* 94*  CO2 23 26 27   GLUCOSE 80 100* 120*  BUN 72* 47* 42*  CREATININE 9.67* 7.33* 7.84*  CALCIUM  8.7* 9.2 8.6*  PHOS  --   --  6.2*    Liver  Function Tests: Recent Labs  Lab 03/04/24 1239 03/05/24 1810 03/08/24 0802  AST 26 23  --   ALT 16 14  --   ALKPHOS 30* 29*  --   BILITOT 0.9 0.7  --   PROT 7.2 6.9  --   ALBUMIN 3.7 3.5 3.3*   No results for input(s): LIPASE, AMYLASE in the last 168 hours. No results for input(s): AMMONIA in the last 168 hours.  CBC: Recent Labs  Lab 03/04/24 1315 03/05/24 1810 03/08/24 0802  WBC 5.6 4.3 4.0  NEUTROABS 4.4 2.8  --   HGB 10.8* 10.9* 10.2*  HCT 34.0* 33.9* 31.0*  MCV 94.2 94.2 92.5  PLT 129* 150 144*    Cardiac Enzymes: No results for input(s): CKTOTAL, CKMB, CKMBINDEX, TROPONINI in the last 168 hours.  BNP: Invalid input(s): POCBNP  CBG: Recent Labs  Lab 03/07/24 0724 03/09/24 0808  GLUCAP 112* 100*    Microbiology: Results for orders placed or performed during the hospital encounter of 09/14/22  Resp panel by RT-PCR (RSV, Flu A&B, Covid) Anterior Nasal Swab     Status: None   Collection Time: 09/14/22  2:45 AM   Specimen: Anterior Nasal Swab  Result Value Ref Range Status   SARS Coronavirus 2 by RT PCR NEGATIVE NEGATIVE Final    Comment: (NOTE) SARS-CoV-2 target nucleic acids are NOT DETECTED.  The SARS-CoV-2 RNA is generally detectable in upper respiratory specimens during the acute phase of infection. The lowest concentration of SARS-CoV-2 viral copies this assay can detect  is 138 copies/mL. A negative result does not preclude SARS-Cov-2 infection and should not be used as the sole basis for treatment or other patient management decisions. A negative result may occur with  improper specimen collection/handling, submission of specimen other than nasopharyngeal swab, presence of viral mutation(s) within the areas targeted by this assay, and inadequate number of viral copies(<138 copies/mL). A negative result must be combined with clinical observations, patient history, and epidemiological information. The expected result is  Negative.  Fact Sheet for Patients:  BloggerCourse.com  Fact Sheet for Healthcare Providers:  SeriousBroker.it  This test is no t yet approved or cleared by the [place]  FDA and  has been authorized for detection and/or diagnosis of SARS-CoV-2 by FDA under an Emergency Use Authorization (EUA). This EUA will remain  in effect (meaning this test can be used) for the duration of the COVID-19 declaration under Section 564(b)(1) of the Act, 21 U.S.C.section 360bbb-3(b)(1), unless the authorization is terminated  or revoked sooner.       Influenza A by PCR NEGATIVE NEGATIVE Final   Influenza B by PCR NEGATIVE NEGATIVE Final    Comment: (NOTE) The Xpert Xpress SARS-CoV-2/FLU/RSV plus assay is intended as an aid in the diagnosis of influenza from Nasopharyngeal swab specimens and should not be used as a sole basis for treatment. Nasal washings and aspirates are unacceptable for Xpert Xpress SARS-CoV-2/FLU/RSV testing.  Fact Sheet for Patients: BloggerCourse.com  Fact Sheet for Healthcare Providers: SeriousBroker.it  This test is not yet approved or cleared by the [place]  FDA and has been authorized for detection and/or diagnosis of SARS-CoV-2 by FDA under an Emergency Use Authorization (EUA). This EUA will remain in effect (meaning this test can be used) for the duration of the COVID-19 declaration under Section 564(b)(1) of the Act, 21 U.S.C. section 360bbb-3(b)(1), unless the authorization is terminated or revoked.     Resp Syncytial Virus by PCR NEGATIVE NEGATIVE Final    Comment: (NOTE) Fact Sheet for Patients: BloggerCourse.com  Fact Sheet for Healthcare Providers: SeriousBroker.it  This test is not yet approved or cleared by the [place]  FDA and has been authorized for detection and/or diagnosis of  SARS-CoV-2 by FDA under an Emergency Use Authorization (EUA). This EUA will remain in effect (meaning this test can be used) for the duration of the COVID-19 declaration under Section 564(b)(1) of the Act, 21 U.S.C. section 360bbb-3(b)(1), unless the authorization is terminated or revoked.  Performed at [facility], [address] [place], [place] [address]     Coagulation Studies: No results for input(s): LABPROT, INR in the last 72 hours.   Urinalysis: No results for input(s): COLORURINE, LABSPEC, PHURINE, GLUCOSEU, HGBUR, BILIRUBINUR, KETONESUR, PROTEINUR, UROBILINOGEN, NITRITE, LEUKOCYTESUR in the last 72 hours.  Invalid input(s): APPERANCEUR    Imaging: EEG adult Result Date: 03/07/2024 Shelton Arlin KIDD, MD     03/07/2024  1:23 PM Patient Name: RANESHA VAL MRN: [id number] Epilepsy Attending: Arlin KIDD Shelton Referring Physician/Provider: Merrianne Locus, MD Date: 03/07/2024 Duration: 41.54 mins Patient history: 67yo F with slurred speech, left-sided facial spasms and weakness. EEG to evaluate for seizure Level of alertness: Awake, asleep AEDs during EEG study: None Technical aspects: This EEG study was done with scalp electrodes positioned according to the 10-20 International system of electrode placement. Electrical activity was reviewed with band pass filter of 1-70Hz , sensitivity of 7 uV/mm, display speed of 1mm/sec with a 60Hz  notched filter applied as appropriate. EEG data were recorded continuously and digitally stored.  Video  monitoring was available and reviewed as appropriate. Description: The posterior dominant rhythm consists of 8-9 Hz activity of moderate voltage (25-35 uV) seen predominantly in posterior head regions, symmetric and reactive to eye opening and eye closing. Sleep was characterized by vertex waves, sleep spindles (12 to 14 Hz), maximal frontocentral region. EEG showed continuous 3 to 6 Hz theta-delta slowing  in right  temporo-parietal region. Hyperventilation and photic stimulation were not performed.   ABNORMALITY - Continuous slow,  right temporo-parietal region. IMPRESSION: This study is suggestive of cortical dysfunction arising from right temporo-parietal region likely secondary to underlying structural abnormality. No seizures or epileptiform discharges were seen throughout the recording. Please note lac of epileptiform activity during interictal EEG does not exclude the diagnosis of epilepsy. Priyanka O Yadav     Medications:     aspirin   300 mg Rectal Daily   Or   aspirin   325 mg Oral Daily   atorvastatin   40 mg Oral Daily   calcium  acetate  667 mg Oral TID WC   carvedilol   25 mg Oral BID WC   Chlorhexidine  Gluconate Cloth  6 each Topical Daily   Chlorhexidine  Gluconate Cloth  6 each Topical Q0600   cloNIDine   0.3 mg Oral TID   diltiazem   120 mg Oral Daily   doxazosin   4 mg Oral Daily   hydrALAZINE   20 mg Intravenous Once   isosorbide  mononitrate  120 mg Oral Daily   losartan   100 mg Oral Daily   QUEtiapine   25 mg Oral TID   acetaminophen  **OR** acetaminophen  (TYLENOL ) oral liquid 160 mg/5 mL **OR** acetaminophen , labetalol , LORazepam , ondansetron  (ZOFRAN ) IV, QUEtiapine , senna-docusate, sodium chloride  flush  Assessment/ Plan:  Ms. ANGELLEE COHILL is a 68 y.o.  female with past medical conditions including dementia, hypertension, diabetes, hyperlipidemia, cervical cancer, recurrent pancreatitis, PVD, and end-stage renal disease on hemodialysis.  Patient presents to the emergency department after experiencing slurred speech, left-sided facial spasms and weakness.  Patient has been admitted for TIA (transient ischemic attack) [G45.9] Hallucination [R44.3]  [facility]/MWF/Lt AVG    End-stage renal disease on hemodialysis.  Received dialysis yesterday with UF 1L achieved.  Next treatment scheduled for Monday.  Transient ischemic attack, reporting symptoms of slurred speech,  involuntary left-sided facial spasms and weakness.  Symptoms resolved.  Neurology consulted, TIA versus partial complex seizure.  Brain imaging negative for acute infarct.  Carotid ultrasound shows right-sided 50 to 60% stenosis without plaque.    3.  Hypertensive urgency, blood pressure severely elevated on admission.  Home regimen includes amlodipine , carvedilol , doxazosin , losartan , and isosorbide .  All were held initially however have been restarted.  Amlodipine  remains held and clonidine  has been added.  Blood pressure elevated, 186/81  4. Secondary Hyperparathyroidism: with outpatient labs:   Lab Results  Component Value Date   CALCIUM  8.6 (L) 03/08/2024   PHOS 6.2 (H) 03/08/2024    Patient is prescribed calcium  acetate with meals outpatient.  Continue calcium  acetate with meals. Will continue to monitor bone minerals.     LOS: 2 Braxtyn Dorff 6/28/202510:38 AM

## 2024-03-09 NOTE — Progress Notes (Signed)
 Per Dr Marquette Sites, dc tele monitoring

## 2024-03-09 NOTE — Plan of Care (Signed)
  Problem: Education: Goal: Knowledge of disease or condition will improve Outcome: Progressing   Problem: Education: Goal: Knowledge of secondary prevention will improve (MUST DOCUMENT ALL) Outcome: Progressing   Problem: Education: Goal: Knowledge of patient specific risk factors will improve (DELETE if not current risk factor) Outcome: Progressing   Problem: Ischemic Stroke/TIA Tissue Perfusion: Goal: Complications of ischemic stroke/TIA will be minimized Outcome: Progressing

## 2024-03-09 NOTE — Care Management Important Message (Signed)
 Important Message  Patient Details  Name: Tracy Jennings MRN: [id number] Date of Birth: [birth year]   Important Message Given:  Yes - Medicare IM     Tamikia Chowning W, CMA 03/09/2024, 11:39 AM

## 2024-03-10 DIAGNOSIS — G459 Transient cerebral ischemic attack, unspecified: Secondary | ICD-10-CM | POA: Diagnosis not present

## 2024-03-10 LAB — GLUCOSE, CAPILLARY: Glucose-Capillary: 107 mg/dL — ABNORMAL HIGH (ref 70–99)

## 2024-03-10 MED ORDER — HYDRALAZINE HCL 20 MG/ML IJ SOLN
20.0000 mg | Freq: Four times a day (QID) | INTRAMUSCULAR | Status: DC | PRN
  Administered 2024-03-10 – 2024-03-12 (×4): 20 mg via INTRAVENOUS
  Filled 2024-03-10 (×4): qty 1

## 2024-03-10 NOTE — Plan of Care (Signed)
  Problem: Coping: Goal: Will verbalize positive feelings about self Outcome: Progressing   Problem: Health Behavior/Discharge Planning: Goal: Goals will be collaboratively established with patient/family Outcome: Progressing   Problem: Self-Care: Goal: Ability to communicate needs accurately will improve Outcome: Progressing   Problem: Nutrition: Goal: Risk of aspiration will decrease Outcome: Progressing Goal: Dietary intake will improve Outcome: Progressing   Problem: Clinical Measurements: Goal: Ability to maintain clinical measurements within normal limits will improve Outcome: Progressing Goal: Will remain free from infection Outcome: Progressing Goal: Diagnostic test results will improve Outcome: Progressing Goal: Respiratory complications will improve Outcome: Progressing Goal: Cardiovascular complication will be avoided Outcome: Progressing   Problem: Nutrition: Goal: Adequate nutrition will be maintained Outcome: Progressing   Problem: Skin Integrity: Goal: Risk for impaired skin integrity will decrease Outcome: Progressing

## 2024-03-10 NOTE — Consult Note (Signed)
 [facility] Consult Follow up  Patient Name: .Tracy Jennings  MRN: [id number]  DOB: 08-Feb-1956  Consult Order details:  Orders (From admission, onward)     Start     Ordered   03/07/24 0935  IP CONSULT TO PSYCHIATRY       Ordering Provider: Leesa Kast, DO  Provider:  (Not yet assigned)  Question Answer Comment  Location [facility]   Reason for Consult? Capacity eval to leave [facility]      03/07/24 0934             Mode of Visit: In person    Psychiatry Consult Evaluation  Service Date: March 10, 2024 LOS:  LOS: 3 days  Chief Complaint Capacity eval  Primary Psychiatric Diagnoses  Delirium due to multiple etiologies- uremia 2.   3.    Assessment  Tracy Jennings is a 68 y.o. female admitted: Medicallyfor 03/05/2024  5:50 PM  with ESRD on HD MWF, HTN, HLD, DM, PVD, dementia, chronic recurrent pancreatitis, stroke, cervical cancer, HIT, who presents with slurred speech, left facial spasm, left-sided weakness.  Patient had syncopal episode on 6/23 and was seen in the ED.  At that time she was found to have O2 desaturation to 80% due to pulmonary edema on chest x-ray as well as hyperkalemia with a potassium of 6.2.  She had negative head CT during that admission.  She received hemodialysis and then was discharged home.  Her last known normal was 6/24 at 1700.  Upon arrival to the ED patient's confusion had resolved entirely.  Labs in the ED mostly unremarkable, potassium 4.6, creatinine consistent with ESRD.  Blood pressure was found to be 252/79.  Patient was placed in telemetry bed for observation.  Code stroke was called.Blood pressure resolved.  Head CT was without acute intracranial abnormality.  Chronic infarct seen on right parietal lobe and extensive chronic microvascular ischemic changes with remote lacunar infarcts in the right caudate and right thalamus appreciated.  Patient was admitted for workup of presumed TIA.  LDL 96, hemoglobin A1c  5.1%.  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  MRI brain revealed chronic encephalomalacia within the right parietal lobe and advanced diffuse cerebral white matter disease.  Patient underwent carotid Dopplers which revealed elevated velocity within the distal right internal carotid artery suggesting stenosis of 50 to 69% though no significant plaque identified and thus this may be artifactual. Patient underwent hemodialysis on 6/25 but left her session early AMA.  When she returned to the room she reported that she wanted to leave AGAINST MEDICAL ADVICE. Family expressed significant concerns about patient signing AMA and patient reportedly couldn't even sign the paper properly. Psychiatry is consulted to evaluate for capacity due to concerns of delirium.   03/10/24: On assessment patient is responding well to the increased dose of Seroquel  25 mg twice daily.  She denies any visual hallucinations today and is noted to be resting well.  No safety concerns as she consistently denies SI/HI/plan.  Psychiatry will follow-up as needed basis.    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  Given the psychosis, altered mental status patient is unable to make a reasonable decision at this time.  It is our clinical opinion that  patient lacks capacity as of now to make the decision of leaving AMA.  Will recommend to reach out to family members for surrogate decision making.  ## Further Work-up:  -- Patient is seen and followed by neurology  -   ## Disposition:-- There are no psychiatric contraindications to discharge at this time  ## Behavioral / Environmental: -Delirium Precautions: Delirium Interventions for Nursing and Staff: - RN to open blinds every AM. - To Bedside: Glasses, hearing aide, and pt's own shoes. Make available to patients. when possible and encourage use. - Encourage po fluids when appropriate, keep fluids within reach. - OOB to chair with meals. - Passive ROM exercises to all extremities with AM & PM care. - RN to assess orientation to person, time and place QAM and PRN. - Recommend extended visitation hours with familiar family/friends as feasible. - Staff to minimize disturbances at night. Turn off television when pt asleep or when not in use.    ## Safety and Observation Level:  - Based on my clinical evaluation, I estimate the patient to be at low risk of self harm in the current setting. - At this time, we recommend  routine. This decision is based on my review of the chart including patient's history and current presentation, interview of the patient, mental status examination, and consideration of suicide risk including evaluating suicidal ideation, plan, intent, suicidal or self-harm behaviors, risk factors, and protective factors. This judgment is based on our ability to directly address suicide risk, implement suicide prevention strategies, and develop a safety plan while the patient is in the clinical setting. Please contact our team if there is a concern that risk level has changed.  CSSR Risk Category:C-SSRS RISK CATEGORY: No Risk  Suicide Risk Assessment: Patient has following modifiable risk factors for suicide: None identified at this time Patient has following non-modifiable or  demographic risk factors for suicide: None identified at this time Patient has the following protective factors against suicide: Supportive family and Cultural, spiritual, or religious beliefs that discourage suicide  Thank you for this consult request. Recommendations have been communicated to the primary team.  We will continue to follow-up at this time.   Allyn Foil, MD       History of Present Illness  Cherie Lasalle Kunath is a 68 y.o. female admitted: Medicallyfor 03/05/2024  5:50 PM  with ESRD on HD MWF, HTN, HLD, DM, PVD, dementia, chronic recurrent pancreatitis, stroke, cervical cancer, HIT, who presents with slurred speech, left facial spasm, left-sided weakness.  Patient had syncopal episode on 6/23 and was seen in the ED.  At that time she was found to have O2 desaturation to 80% due to pulmonary edema on chest x-ray as well as hyperkalemia with a potassium of 6.2.  She had negative head CT during that admission.  She received hemodialysis and then was discharged home.  Her last known normal was 6/24 at 1700.  Upon arrival to the ED patient's confusion had resolved entirely.  Labs in the ED mostly unremarkable, potassium 4.6, creatinine consistent with ESRD.  Blood pressure was found to be  252/79.  Patient was placed in telemetry bed for observation.  Code stroke was called.Blood pressure resolved.  Head CT was without acute intracranial abnormality.  Chronic infarct seen on right parietal lobe and extensive chronic microvascular ischemic changes with remote lacunar infarcts in the right caudate and right thalamus appreciated.  Patient was admitted for workup of presumed TIA.  LDL 96, hemoglobin A1c 5.1%.  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  MRI brain revealed chronic encephalomalacia within the right parietal lobe and advanced diffuse cerebral white matter disease.  Patient underwent carotid Dopplers which revealed elevated velocity within the distal right  internal carotid artery suggesting stenosis of 50 to 69% though no significant plaque identified and thus this may be artifactual. Patient underwent hemodialysis on 6/25 but left her session early AMA.  When she returned to the room she reported that she wanted to leave AGAINST MEDICAL ADVICE. Family expressed significant concerns about patient signing AMA and patient reportedly couldn't even sign the paper properly. Psychiatry is consulted to evaluate for capacity due to concerns of delirium.  03/10/24: Patient is noted to be resting in bed.  Nurse has just helped her in changing her clothes.  She reports having fair sleep and appetite.  Provider asked about the little children that she was seen earlier in the room yesterday.  Patient denies feeling any little children or anybody else except for the provider and patient in the room.  She denies any other frustration or any confusion intermittently.  She denies auditory hallucinations.  She reports feeling frustrated about being in the hospital and not being able to go home.  She denies SI/HI/plan.  She is taking her medications with no reported side effects. Psychiatric and Social History  Psychiatric History:  Information collected from patient  Prev Dx/Sx: denies Current Psych Provider: none reported Home Meds (current): none reported Previous Med Trials: denies Therapy: denies  Prior Psych Hospitalization: denies  Prior Self Harm: denies Prior Violence: denies  Family Psych History: denies Family Hx suicide: denies  Social History:   Educational Hx: HS Occupational Hx: disability Legal Hx: denies Living Situation: lives by herself but has family support Spiritual Hx: denies Access to weapons/lethal means: denies   Substance History Alcohol: denies  Tobacco: denies Illicit drugs: denies Prescription drug abuse: denies Rehab hx: denies  Exam Findings  Physical Exam: Reviewed and agree with the physical exam findings Vital  Signs:  Temp:  [97.5 F (36.4 C)-98.1 F (36.7 C)] 97.8 F (36.6 C) (06/29 0526) Pulse Rate:  [62-70] 69 (06/29 0815) Resp:  [18] 18 (06/29 0815) BP: (142-216)/(45-70) 142/45 (06/29 0815) SpO2:  [95 %-100 %] 98 % (06/29 0815) Blood pressure (!) 142/45, pulse 69, temperature 97.8 F (36.6 C), resp. rate 18, height 5' 3 (1.6 m), weight 57.8 kg, SpO2 98%. Body mass index is 22.57 kg/m.    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  Family History:  The patient's family history includes Cancer in her father; Diabetes in her maternal grandfather, maternal grandmother, paternal grandfather, paternal grandmother, and sister; Gout in her mother; Hypertension in her mother; Stroke in her mother.  Medical History: Past Medical History:  Diagnosis Date   Anemia 04/2018   low iron. to be started on supplements   Cervical cancer (HCC)    CKD (chronic kidney disease)    Stage IV   Complication of anesthesia    receceived too much anesthesia, that she was in coma for a couple days    COVID-19 virus detected 10/28/2019   Diabetes mellitus without complication (HCC)    type II   ESRD (end stage  renal disease) (HCC)    Heart murmur    followed as a child only   HSIL (high grade squamous intraepithelial lesion) on Pap smear of cervix    Hyperlipidemia associated with type 2 diabetes mellitus (HCC)    Hypertension    Pancreatitis    Peripheral vascular disease (HCC)     Surgical History: Past Surgical History:  Procedure Laterality Date   AMPUTATION TOE Left 2013   2nd toe. tip of toe (toe nail was infected)   AV FISTULA PLACEMENT Left 05/11/2018   Procedure: ARTERIOVENOUS (AV) FISTULA CREATION;  Surgeon: Jama Cordella MATSU, MD;  Location: [facility];  Service: Vascular;  Laterality: Left;   CATARACT EXTRACTION     CERVICAL CONIZATION W/BX N/A 04/08/2020   Procedure: CONIZATION CERVIX WITH BIOPSY;  Surgeon: Mancil Barter, MD;  Location: [facility];  Service: Gynecology;  Laterality: N/A;   CHOLECYSTECTOMY  2014   COLONOSCOPY     COLONOSCOPY WITH PROPOFOL  N/A 01/10/2018   Procedure: COLONOSCOPY WITH PROPOFOL ;  Surgeon: Toledo, Ladell POUR, MD;  Location: [facility];  Service: Gastroenterology;  Laterality: N/A;   DIALYSIS/PERMA CATHETER INSERTION N/A 05/21/2018   Procedure: DIALYSIS/PERMA CATHETER INSERTION;  Surgeon: Marea Selinda RAMAN, MD;  Location: [facility];  Service: Cardiovascular;  Laterality: N/A;   DIALYSIS/PERMA CATHETER INSERTION N/A 07/14/2020   Procedure: DIALYSIS/PERMA CATHETER INSERTION;  Surgeon: Marea Selinda RAMAN, MD;  Location: [facility];  Service: Cardiovascular;  Laterality: N/A;   DIALYSIS/PERMA CATHETER REMOVAL N/A 04/11/2019   Procedure: DIALYSIS/PERMA CATHETER REMOVAL;  Surgeon: Marea Selinda RAMAN, MD;  Location: [facility];  Service: Cardiovascular;  Laterality: N/A;   EYE SURGERY Bilateral 2018   cataract extractions   IR IMAGING GUIDED PORT INSERTION  06/05/2020   THROMBECTOMY W/ EMBOLECTOMY  05/11/2018   Procedure: THROMBECTOMY ARTERIOVENOUS FISTULA;  Surgeon: Jama Cordella MATSU, MD;  Location: [facility];  Service: Vascular;;   TUBAL LIGATION   1984     Medications:   Current Facility-Administered Medications:    acetaminophen  (TYLENOL ) tablet 650 mg, 650 mg, Oral, Q4H PRN, 650 mg at 03/09/24 2159 **OR** acetaminophen  (TYLENOL ) 160 MG/5ML solution 650 mg, 650 mg, Per Tube, Q4H PRN **OR** acetaminophen  (TYLENOL ) suppository 650 mg, 650 mg, Rectal, Q4H PRN, Niu, Xilin, MD   aspirin  suppository 300 mg, 300 mg, Rectal, Daily **OR** aspirin  tablet 325 mg, 325 mg, Oral, Daily, Niu, Xilin, MD, 325 mg at 03/10/24 1100  atorvastatin  (LIPITOR) tablet 40 mg, 40 mg, Oral, Daily, Niu, Xilin, MD, 40 mg at 03/10/24 1100   calcium  acetate (PHOSLO ) capsule 667 mg, 667 mg, Oral, TID WC, Breeze, Shantelle, NP, 667 mg at 03/10/24 0806   carvedilol  (COREG ) tablet 25 mg, 25 mg, Oral, BID WC, Breeze, Shantelle, NP, 25 mg at 03/10/24 0806   Chlorhexidine  Gluconate Cloth 2 % PADS 6 each, 6 each, Topical, Daily, Dezii, Alexandra, DO, 6 each at 03/10/24 1100   Chlorhexidine  Gluconate Cloth 2 % PADS 6 each, 6 each, Topical, Q0600, Druscilla Bald, NP, 6 each at 03/08/24 0630   cloNIDine  (CATAPRES ) tablet 0.3 mg, 0.3 mg, Oral, TID, Dezii, Alexandra, DO, 0.3 mg at 03/10/24 1100   diltiazem  (CARDIZEM  CD) 24 hr capsule 120 mg, 120 mg, Oral, Daily, Breeze, Shantelle, NP, 120 mg at 03/10/24 1100   doxazosin  (CARDURA ) tablet 4 mg, 4 mg, Oral, Daily, Breeze, Shantelle, NP, 4 mg at 03/10/24 1100   hydrALAZINE  (APRESOLINE ) injection 20 mg, 20 mg, Intravenous, Q6H PRN, Dezii, Alexandra, DO, 20 mg at 03/10/24 0830   isosorbide  mononitrate (IMDUR ) 24 hr tablet 120 mg, 120 mg, Oral, Daily, Dezii, Alexandra, DO, 120 mg at 03/10/24 1100   labetalol  (NORMODYNE ) injection 10 mg, 10 mg, Intravenous, Q2H PRN, Dezii, Alexandra, DO, 10 mg at 03/10/24 0435   LORazepam  (ATIVAN ) injection 2 mg, 2 mg, Intravenous, Q2H PRN, Niu, Xilin, MD   losartan  (COZAAR ) tablet 100 mg, 100 mg, Oral, Daily, Breeze, Shantelle, NP, 100 mg at 03/09/24 1728   ondansetron  (ZOFRAN ) injection 4 mg, 4 mg,  Intravenous, Q8H PRN, Niu, Xilin, MD, 4 mg at 03/06/24 9189   QUEtiapine  (SEROQUEL ) tablet 25 mg, 25 mg, Oral, TID, Royalti Schauf, MD, 25 mg at 03/10/24 1100   QUEtiapine  (SEROQUEL ) tablet 25 mg, 25 mg, Oral, Q8H PRN, Orvin Netter, MD   senna-docusate (Senokot-S) tablet 1 tablet, 1 tablet, Oral, QHS PRN, Niu, Xilin, MD   sodium chloride  flush (NS) 0.9 % injection 10-40 mL, 10-40 mL, Intracatheter, PRN, Dezii, Alexandra, DO, 10 mL at 03/10/24 0830  Allergies: Allergies  Allergen Reactions   Amlodipine  Swelling    Knees down to ankles Knees down to ankles   Hctz [Hydrochlorothiazide]     pancreatitis   Heparin  Other (See Comments)    Hx  HIT,   Pt reports cardiac arrest when given heparin    Lmw Heparin      Reports cardiac arrest when given heparin    Other Other (See Comments)    Reports cardiac arrest when given during surgery Reports cardiac arrest when given during surgery Reports cardiac arrest when given during surgery    Sulfa Antibiotics Rash   Lactose    Dairy Aid [Tilactase]     Runny nose   Hydralazine      Nausea and rash    Shanley Furlough, MD

## 2024-03-10 NOTE — Progress Notes (Signed)
 [facility]  ROUNDING NOTE   Subjective:   Tracy Jennings is a 68 year old female with past medical conditions including dementia, hypertension, diabetes, hyperlipidemia, cervical cancer, recurrent pancreatitis, PVD, and end-stage renal disease on hemodialysis.  Patient presents to the emergency department after experiencing slurred speech, left-sided facial spasms and weakness.  Patient has been admitted for TIA (transient ischemic attack) [G45.9] Hallucination [R44.3]  Patient is known to our practice and receives outpatient dialysis treatments at [facility] on a MWF schedule, supervised by Dr. Dennise.   Update: Patient seen resting quietly in bed Awaiting breakfast Patient is easily aroused and oriented to person currently calm and denies any auditory or visual hallucinations   Objective:  Vital signs in last 24 hours:  Temp:  [97.5 F (36.4 C)-98.1 F (36.7 C)] 97.8 F (36.6 C) (06/29 0526) Pulse Rate:  [62-70] 65 (06/29 0729) Resp:  [18] 18 (06/29 0729) BP: (159-216)/(55-81) 212/64 (06/29 0729) SpO2:  [95 %-100 %] 99 % (06/29 0729)  Weight change:  Filed Weights   03/06/24 1228 03/08/24 0733 03/08/24 1122  Weight: 54.4 kg 58.8 kg 57.8 kg    Intake/Output: I/O last 3 completed shifts: In: 120 [P.O.:120] Out: -    Intake/Output this shift:  No intake/output data recorded.  Physical Exam: General: NAD  Head: Normocephalic, atraumatic. Moist oral mucosal membranes  Eyes: Anicteric  Neck: Supple  Lungs:  Clear to auscultation, normal effort  Heart: Regular rate and rhythm  Abdomen:  Soft, nontender  Extremities: No peripheral edema.  Neurologic: Awake, alert, conversant  Skin: Warm,dry, no rash  Access: Left aVF    Basic Metabolic Panel: Recent Labs  Lab 03/04/24 1239 03/05/24 1810 03/08/24 0802  NA 137 135 133*  K 6.2* 4.6 4.4  CL 100 95* 94*  CO2 23 26 27   GLUCOSE 80 100* 120*  BUN 72* 47* 42*  CREATININE 9.67* 7.33*  7.84*  CALCIUM  8.7* 9.2 8.6*  PHOS  --   --  6.2*    Liver Function Tests: Recent Labs  Lab 03/04/24 1239 03/05/24 1810 03/08/24 0802  AST 26 23  --   ALT 16 14  --   ALKPHOS 30* 29*  --   BILITOT 0.9 0.7  --   PROT 7.2 6.9  --   ALBUMIN 3.7 3.5 3.3*   No results for input(s): LIPASE, AMYLASE in the last 168 hours. No results for input(s): AMMONIA in the last 168 hours.  CBC: Recent Labs  Lab 03/04/24 1315 03/05/24 1810 03/08/24 0802  WBC 5.6 4.3 4.0  NEUTROABS 4.4 2.8  --   HGB 10.8* 10.9* 10.2*  HCT 34.0* 33.9* 31.0*  MCV 94.2 94.2 92.5  PLT 129* 150 144*    Cardiac Enzymes: No results for input(s): CKTOTAL, CKMB, CKMBINDEX, TROPONINI in the last 168 hours.  BNP: Invalid input(s): POCBNP  CBG: Recent Labs  Lab 03/07/24 0724 03/09/24 0808 03/10/24 0728  GLUCAP 112* 100* 107*    Microbiology: Results for orders placed or performed during the hospital encounter of 09/14/22  Resp panel by RT-PCR (RSV, Flu A&B, Covid) Anterior Nasal Swab     Status: None   Collection Time: 09/14/22  2:45 AM   Specimen: Anterior Nasal Swab  Result Value Ref Range Status   SARS Coronavirus 2 by RT PCR NEGATIVE NEGATIVE Final    Comment: (NOTE) SARS-CoV-2 target nucleic acids are NOT DETECTED.  The SARS-CoV-2 RNA is generally detectable in upper respiratory specimens during the acute phase of infection. The  lowest concentration of SARS-CoV-2 viral copies this assay can detect is 138 copies/mL. A negative result does not preclude SARS-Cov-2 infection and should not be used as the sole basis for treatment or other patient management decisions. A negative result may occur with  improper specimen collection/handling, submission of specimen other than nasopharyngeal swab, presence of viral mutation(s) within the areas targeted by this assay, and inadequate number of viral copies(<138 copies/mL). A negative result must be combined with clinical observations,  patient history, and epidemiological information. The expected result is Negative.  Fact Sheet for Patients:  BloggerCourse.com  Fact Sheet for Healthcare Providers:  SeriousBroker.it  This test is no t yet approved or cleared by the [place]  FDA and  has been authorized for detection and/or diagnosis of SARS-CoV-2 by FDA under an Emergency Use Authorization (EUA). This EUA will remain  in effect (meaning this test can be used) for the duration of the COVID-19 declaration under Section 564(b)(1) of the Act, 21 U.S.C.section 360bbb-3(b)(1), unless the authorization is terminated  or revoked sooner.       Influenza A by PCR NEGATIVE NEGATIVE Final   Influenza B by PCR NEGATIVE NEGATIVE Final    Comment: (NOTE) The Xpert Xpress SARS-CoV-2/FLU/RSV plus assay is intended as an aid in the diagnosis of influenza from Nasopharyngeal swab specimens and should not be used as a sole basis for treatment. Nasal washings and aspirates are unacceptable for Xpert Xpress SARS-CoV-2/FLU/RSV testing.  Fact Sheet for Patients: BloggerCourse.com  Fact Sheet for Healthcare Providers: SeriousBroker.it  This test is not yet approved or cleared by the [place]  FDA and has been authorized for detection and/or diagnosis of SARS-CoV-2 by FDA under an Emergency Use Authorization (EUA). This EUA will remain in effect (meaning this test can be used) for the duration of the COVID-19 declaration under Section 564(b)(1) of the Act, 21 U.S.C. section 360bbb-3(b)(1), unless the authorization is terminated or revoked.     Resp Syncytial Virus by PCR NEGATIVE NEGATIVE Final    Comment: (NOTE) Fact Sheet for Patients: BloggerCourse.com  Fact Sheet for Healthcare Providers: SeriousBroker.it  This test is not yet approved or cleared by the [place] FDA and has been authorized for detection and/or diagnosis of SARS-CoV-2 by FDA under an Emergency Use Authorization (EUA). This EUA will remain in effect (meaning this test can be used) for the duration of the COVID-19 declaration under Section 564(b)(1) of the Act, 21 U.S.C. section 360bbb-3(b)(1), unless the authorization is terminated or revoked.  Performed at [facility], [address] [place], [place] [address]     Coagulation Studies: No results for input(s): LABPROT, INR in the last 72 hours.   Urinalysis: No results for input(s): COLORURINE, LABSPEC, PHURINE, GLUCOSEU, HGBUR, BILIRUBINUR, KETONESUR, PROTEINUR, UROBILINOGEN, NITRITE, LEUKOCYTESUR in the last 72 hours.  Invalid input(s): APPERANCEUR    Imaging: No results found.    Medications:     aspirin   300 mg Rectal Daily   Or   aspirin   325 mg Oral Daily   atorvastatin   40 mg Oral Daily   calcium  acetate  667 mg Oral TID WC   carvedilol   25 mg Oral BID WC   Chlorhexidine  Gluconate Cloth  6 each Topical Daily   Chlorhexidine  Gluconate Cloth  6 each Topical Q0600   cloNIDine   0.3 mg Oral TID   diltiazem   120 mg Oral Daily   doxazosin   4 mg Oral Daily   isosorbide  mononitrate  120 mg Oral Daily   losartan   100 mg Oral Daily   QUEtiapine   25 mg Oral TID   acetaminophen  **OR** acetaminophen  (TYLENOL ) oral liquid 160 mg/5 mL **OR** acetaminophen , hydrALAZINE , labetalol , LORazepam , ondansetron  (ZOFRAN ) IV, QUEtiapine , senna-docusate, sodium chloride  flush  Assessment/ Plan:  Tracy Jennings is a 68 y.o.  female with past medical conditions including dementia, hypertension, diabetes, hyperlipidemia, cervical cancer, recurrent pancreatitis, PVD, and end-stage renal disease on hemodialysis.  Patient presents to the emergency department after experiencing slurred speech, left-sided facial spasms and weakness.  Patient has been admitted for TIA (transient ischemic  attack) [G45.9] Hallucination [R44.3]  [facility]/MWF/Lt AVG    End-stage renal disease on hemodialysis.  Next treatment scheduled for Monday.  Transient ischemic attack, reporting symptoms of slurred speech, involuntary left-sided facial spasms and weakness.  Symptoms resolved.  Neurology consulted, TIA versus partial complex seizure.  Brain imaging negative for acute infarct.  Carotid ultrasound shows right-sided 50 to 60% stenosis without plaque.    3.  Hypertensive urgency, blood pressure severely elevated on admission.  Home regimen includes amlodipine , carvedilol , doxazosin , losartan , and isosorbide .  Currently receiving carvedilol , clonidine , diltiazem , doxazosin , isosorbide , and losartan .  Amlodipine  remains held.  Blood pressure 194/70.  4. Secondary Hyperparathyroidism: with outpatient labs:   Lab Results  Component Value Date   CALCIUM  8.6 (L) 03/08/2024   PHOS 6.2 (H) 03/08/2024    Continue calcium  acetate with meals. Will continue to monitor bone minerals during this admission.     LOS: 3 Danel Requena 6/29/20259:27 AM

## 2024-03-10 NOTE — Progress Notes (Signed)
 PROGRESS NOTE    Tracy Jennings  FMW:[id number] DOB: [birth year] DOA: 03/05/2024 PCP: Zachary Idelia LABOR, MD  Chief Complaint  Patient presents with   Code Stroke    Hospital Course:  This patient is a 68 year old female with ESRD on HD MWF, HTN, HLD, DM, PVD, dementia, chronic recurrent pancreatitis, stroke, cervical cancer, HIT, who presents with slurred speech, left facial spasm, left-sided weakness.  Patient had syncopal episode on 6/23 and was seen in the ED.  At that time she was found to have O2 desaturation to 80% due to pulmonary edema on chest x-ray as well as hyperkalemia with a potassium of 6.2.  She had negative head CT during that admission.  She received hemodialysis and then was discharged home.  Her last known normal was 6/24 at 1700.  Upon arrival to the ED patient's confusion had resolved entirely.  Labs in the ED mostly unremarkable, potassium 4.6, creatinine consistent with ESRD.  Blood pressure was found to be 252/79.  Patient was placed in telemetry bed for observation.  Code stroke was called.Blood pressure resolved.  Head CT was without acute intracranial abnormality.  Chronic infarct seen on right parietal lobe and extensive chronic microvascular ischemic changes with remote lacunar infarcts in the right caudate and right thalamus appreciated.  Patient was admitted for workup of presumed TIA.  LDL 96, hemoglobin A1c 5.1%.  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  MRI brain revealed chronic encephalomalacia within the right parietal lobe and advanced diffuse cerebral white matter disease.  Patient underwent carotid Dopplers which revealed elevated velocity within the distal right internal carotid artery suggesting stenosis of 50 to 69% though no significant plaque identified and thus this may be artifactual. Patient underwent hemodialysis on 6/25 but left her session early AMA.  When she returned to the room she reported that she wanted to leave  AGAINST MEDICAL ADVICE.  I had extensive discussion with the patient at bedside.  Also present for this discussion was nurse, Camillia, and patient's 2 sisters.  At the time my evaluation patient was able to demonstrate medical capacity.  She was alert and oriented x 4 and appropriately repeated my concerns believing the hospital. However, when patient attempted to sign AMA paperwork her signature was illegible.  She began getting upset and responding to internal stimuli.  Her confusion increased to the point that it was no longer safe to discharge.  Psychiatry was consulted.  Psychiatry met with the patient on 6/26 and confirms acute delirium, currently the patient does not have capacity to make medical decisions.  She has been started on Seroquel .    Subjective: No acute events overnight. On evaluation today patient is sleeping, has no complaints. Was refusing most medications this AM. Patient's brother at bedside with cousin.  Objective: Vitals:   03/10/24 0526 03/10/24 0729 03/10/24 0815 03/10/24 1617  BP: (!) 194/70 (!) 212/64 (!) 142/65 (!) 162/64  Pulse: 68 65 69 71  Resp: 18 18 18 16   Temp: 97.8 F (36.6 C)   98.9 F (37.2 C)  TempSrc:      SpO2: 100% 99% 98% 96%  Weight:      Height:        Intake/Output Summary (Last 24 hours) at 03/10/2024 1634 Last data filed at 03/09/2024 2000 Gross per 24 hour  Intake 120 ml  Output --  Net 120 ml    Filed Weights   03/06/24 1228 03/08/24 0733 03/08/24 1122  Weight: 54.4 kg 58.8  kg 57.8 kg   Examination: General exam: Appears calm and comfortable, NAD  Respiratory system: No work of breathing, symmetric chest wall expansion Cardiovascular system: S1 & S2 heard, RRR.  Gastrointestinal system: Abdomen is nondistended, soft and nontender.  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  Throughout the stay her clinical picture is more consistent with acute delirium likely with underlying dementia. - MRA: Moderate stenosis of proximal basilar artery at 50% - MRI brain: Chronic encephalomalacia with the right parietal lobe and advanced diffuse cerebral white matter disease - Carotid Dopplers: Elevated velocity within the distal right internal carotid artery suggesting stenosis of 50 to 69% though no significant plaque identified and thus may be artifactual. - EEG: Cortical dysfunction arising from right temporal parietal region secondary to underlying structural abnormality.  No seizures or epileptiform discharges. - Echocardiogram: EF 55 to 60%, no regional wall motion abnormalities, severe left ventricular hypertrophy, grade 2 diastolic dysfunction elevated pulmonary arterial systolic pressures.  Calcification of aortic valve - Continue statin, continue aspirin . - No events on telemetry.  Will discontinue further monitoring - Lipids and A1c at goal. - Neurology consulted.  Appreciate recommendations  Dementia with behavioral disturbance - Remote history of dementia. No outpatient work up. - Mentation and capacity changing regularly - Psychiatry consulted, patient was initiated on Seroquel  but continues to have intermittent episodes of agitation.  I will increase Seroquel  dosing to 25 3 times daily today.  Will continue to monitor on this dose - Have discussed with neurology who suspects Lewy body dementia is playing a role and  recommends avoiding antipsychotics - Please see psychiatry notes for more details - Presently patient does not have capacity to make medical decisions or leave AMA.  We have significant concerns about her ability to take care of herself at home.  She will likely require placement for safety  Hypertensive urgency - Systolic blood pressure severely elevated above 200. - Resume all home meds (patient listed on amlodipine  but also has allergy.  On alternative CCB.) - Continue to titrate blood pressure daily.  Much improved when taking all medications.  Dimension delirium as above complicates consistent medication.  Pulmonary arterial hypertension Grade 2 diastolic dysfunction - As seen on echocardiogram - Continue GDMT - Tight blood pressure control as able - Remains clinically euvolemic.  Volume to be removed via dialysis if needed  ESRD Electrolyte disturbance - On HD MWF - Nephrology consulted during this admission, continue with dialysis per renal  Elevated troponin - No chest pain, no shortness of breath.  Troponin 41 -> 43. likely secondary to demand ischemia and ESRD - Continue aspirin  and Lipitor  Type 2 diabetes with renal manifestations - Recent hemoglobin A1c 5.1%, well-controlled.  Not currently taking medications - Daily CBG, currently at goal  Anemia of chronic disease, ESRD - Trend CBC. - Hemoglobin currently stable  Hyperlipidemia - Statin  Heparin -induced thrombocytopenia - Avoid heparin  or Lovenox  products for now - SCDs as needed  DVT  prophylaxis: SCDs   Code Status: Full Code Disposition:  Inpatient, needs placement. TOC consulted to arrange Consultants:    Procedures:    Antimicrobials:  Anti-infectives (From admission, onward)    None       Data Reviewed: I have personally reviewed following labs and imaging studies CBC: Recent Labs  Lab 03/04/24 1315 03/05/24 1810 03/08/24 0802  WBC 5.6 4.3 4.0  NEUTROABS 4.4 2.8  --   HGB 10.8*  10.9* 10.2*  HCT 34.0* 33.9* 31.0*  MCV 94.2 94.2 92.5  PLT 129* 150 144*   Basic Metabolic Panel: Recent Labs  Lab 03/04/24 1239 03/05/24 1810 03/08/24 0802  NA 137 135 133*  K 6.2* 4.6 4.4  CL 100 95* 94*  CO2 23 26 27   GLUCOSE 80 100* 120*  BUN 72* 47* 42*  CREATININE 9.67* 7.33* 7.84*  CALCIUM  8.7* 9.2 8.6*  PHOS  --   --  6.2*   GFR: Estimated Creatinine Clearance: 5.8 mL/min (A) (by C-G formula based on SCr of 7.84 mg/dL (H)). Liver Function Tests: Recent Labs  Lab 03/04/24 1239 03/05/24 1810 03/08/24 0802  AST 26 23  --   ALT 16 14  --   ALKPHOS 30* 29*  --   BILITOT 0.9 0.7  --   PROT 7.2 6.9  --   ALBUMIN 3.7 3.5 3.3*   CBG: Recent Labs  Lab 03/07/24 0724 03/09/24 0808 03/10/24 0728  GLUCAP 112* 100* 107*    No results found for this or any previous visit (from the past 240 hours).   Radiology Studies: No results found.   Scheduled Meds:  aspirin   300 mg Rectal Daily   Or   aspirin   325 mg Oral Daily   atorvastatin   40 mg Oral Daily   calcium  acetate  667 mg Oral TID WC   carvedilol   25 mg Oral BID WC   Chlorhexidine  Gluconate Cloth  6 each Topical Daily   Chlorhexidine  Gluconate Cloth  6 each Topical Q0600   cloNIDine   0.3 mg Oral TID   diltiazem   120 mg Oral Daily   doxazosin   4 mg Oral Daily   isosorbide  mononitrate  120 mg Oral Daily   losartan   100 mg Oral Daily   QUEtiapine   25 mg Oral TID   Continuous Infusions:   LOS: 3 days  MDM: Patient is high risk for one or more organ failure.  They necessitate ongoing hospitalization for continued IV therapies and subsequent lab monitoring. Total time spent interpreting labs and vitals, reviewing the medical record, coordinating care amongst consultants and care team members, directly assessing and discussing care with the patient and/or family: 55 min  Dao Memmott, DO [facility]  To contact the attending physician between 7A-7P please use Epic Chat. To contact the covering  physician during after hours 7P-7A, please review Amion.  03/10/2024, 4:34 PM   *This document has been created with the assistance of dictation software. Please excuse typographical errors. *

## 2024-03-10 NOTE — Plan of Care (Signed)
  Problem: Education: Goal: Knowledge of disease or condition will improve 03/10/2024 0535 by Mcneil Eleanor POUR, RN Outcome: Not Progressing 03/09/2024 1936 by Mcneil Eleanor POUR, RN Outcome: Progressing   Problem: Education: Goal: Knowledge of secondary prevention will improve (MUST DOCUMENT ALL) 03/10/2024 0535 by Shery Wauneka K, RN Outcome: Not Progressing 03/09/2024 1936 by Mcneil Eleanor POUR, RN Outcome: Progressing   Problem: Education: Goal: Knowledge of patient specific risk factors will improve (DELETE if not current risk factor) 03/10/2024 0535 by Jalon Squier K, RN Outcome: Not Progressing 03/09/2024 1936 by Mcneil Eleanor POUR, RN Outcome: Progressing   Problem: Ischemic Stroke/TIA Tissue Perfusion: Goal: Complications of ischemic stroke/TIA will be minimized 03/10/2024 0535 by Khamryn Calderone K, RN Outcome: Not Progressing 03/09/2024 1936 by Mcneil Eleanor POUR, RN Outcome: Progressing

## 2024-03-11 DIAGNOSIS — G459 Transient cerebral ischemic attack, unspecified: Secondary | ICD-10-CM | POA: Diagnosis not present

## 2024-03-11 LAB — GLUCOSE, CAPILLARY: Glucose-Capillary: 101 mg/dL — ABNORMAL HIGH (ref 70–99)

## 2024-03-11 NOTE — Progress Notes (Signed)
 Occupational Therapy Treatment Patient Details Name: Tracy Jennings MRN: [id number] DOB: 03-Sep-1956 Today's Date: 03/11/2024   History of present illness Pt is a 68 year old female presents with slurred speech,  left facial spasm, left sided weakness.    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 Upon arrival to room pt semi supine with NT at bedside, pt agreeable to tx with minimal encouragement. Pt requires supervision to complete bed mobility, CGA to STS from low bed height with MINA A for placement of RUE on RW. Pt took ~5 steps into recliner with long standing pause halfway through to reestablished stability and provide encouragement to continue. Pt required step by step multimodal cues for DME management sequencing of steps and safety cues throughout transfer. Pt declined further mobility attempts and ADL complete due to reports previous bath and PT session. Pt retired in [employer] illustr[employer] with all needs in reach. Pt making good progress toward goals, will continue to follow POC. Discharge recommendation remains appropriate.        If plan is discharge home, recommend the following:  A little help with walking and/or transfers;A little help with bathing/dressing/bathroom;Assistance with cooking/housework;Direct supervision/assist for medications management;Direct supervision/assist for financial management;Supervision due to cognitive status;Help with stairs or ramp for entrance;Assist for transportation   Equipment Recommendations  BSC/3in1    Recommendations for Other Services      Precautions / Restrictions Precautions Precautions: Fall Recall of Precautions/Restrictions: Impaired Restrictions Weight Bearing Restrictions Per Provider Order: No       Mobility Bed Mobility Overal bed mobility: Needs  Assistance Bed Mobility: Supine to Sit     Supine to sit: Supervision     General bed mobility comments: Verbal/tactile cues for hand placement during scooting    Transfers Overall transfer level: Needs assistance Equipment used: Rolling walker (2 wheels) Transfers: Sit to/from Stand, Bed to chair/wheelchair/BSC Sit to Stand: Contact guard assist     Step pivot transfers: Contact guard assist     General transfer comment: Heavy use of multimodal cues to complete t/f and to ensure maximum safety with DME     Balance Overall balance assessment: Needs assistance Sitting-balance support: Feet supported, No upper extremity supported Sitting balance-Leahy Scale: Good Sitting balance - Comments: Steady reaching within BOS   Standing balance support: Bilateral upper extremity supported, During functional activity, Reliant on assistive device for balance Standing balance-Leahy Scale: Fair Standing balance comment: reliant on RW for static standing                           ADL either performed or assessed with clinical judgement   ADL Overall ADL's : Needs assistance/impaired     Grooming: Wash/dry face;Sitting Grooming Details (indicate cue type and reason): Verbal cues to initiate task, no physical assist to complete                 Toilet Transfer: Minimal assistance;Ambulation;Rolling walker (2 wheels);Cueing for sequencing;Cueing for safety           Functional mobility during ADLs: Rolling walker (2 wheels);Cueing for sequencing;Cueing for safety;Minimal assistance General ADL Comments: Multi modal cues for mobility during simulated toilet transfer    Extremity/Trunk Assessment              Vision       Perception Perception  Perception: Impaired Preception Impairment Details: Spatial orientation   Praxis Praxis Praxis: Impaired Praxis Impairment Details: Motor planning   Communication Communication Communication: No apparent  difficulties   Cognition Arousal: Alert Behavior During Therapy: Flat affect Cognition: No family/caregiver present to determine baseline, History of cognitive impairments, Cognition impaired   Orientation impairments: Situation, Time Awareness: Online awareness impaired, Intellectual awareness impaired Memory impairment (select all impairments): Short-term memory, Declarative long-term memory Attention impairment (select first level of impairment): Focused attention Executive functioning impairment (select all impairments): Initiation, Organization, Sequencing, Reasoning, Problem solving OT - Cognition Comments: Verbal cues to reach to current date                 Following commands: Impaired Following commands impaired: Follows one step commands inconsistently      Cueing   Cueing Techniques: Verbal cues, Gestural cues, Tactile cues, Visual cues  Exercises Exercises: Other exercises Other Exercises Other Exercises: Edu: Safe DME management, benefits of sitting up in the recliner vs the bed    Shoulder Instructions       General Comments      Pertinent Vitals/ Pain       Pain Assessment Pain Assessment: No/denies pain  Home Living                                          Prior Functioning/Environment              Frequency  Min 2X/week        Progress Toward Goals  OT Goals(current goals can now be found in the care plan section)  Progress towards OT goals: Progressing toward goals  Acute Rehab OT Goals OT Goal Formulation: With patient Time For Goal Achievement: 03/20/24 Potential to Achieve Goals: Good ADL Goals Pt Will Perform Grooming: with supervision;sitting;standing Pt Will Perform Lower Body Dressing: with supervision;sitting/lateral leans;sit to/from stand Pt Will Transfer to Toilet: with supervision;ambulating Pt Will Perform Toileting - Clothing Manipulation and hygiene: with supervision;sit to/from  stand;sitting/lateral leans Additional ADL Goal #1: Pt will participate in further cognitive assessment (i.e. pill box test) to facilitate improved/safe IADL completion  Plan      Co-evaluation                 AM-PAC OT 6 Clicks Daily Activity     Outcome Measure   Help from another person eating meals?: A Little Help from another person taking care of personal grooming?: A Little Help from another person toileting, which includes using toliet, bedpan, or urinal?: A Lot Help from another person bathing (including washing, rinsing, drying)?: A Lot Help from another person to put on and taking off regular upper body clothing?: A Little Help from another person to put on and taking off regular lower body clothing?: A Little 6 Click Score: 16    End of Session Equipment Utilized During Treatment: Gait belt;Rolling walker (2 wheels)  OT Visit Diagnosis: Other abnormalities of gait and mobility (R26.89);Muscle weakness (generalized) (M62.81);Cognitive communication deficit (R41.841)   Activity Tolerance Patient tolerated treatment well   Patient Left in chair;with call bell/phone within reach;with chair alarm set   Nurse Communication Mobility status        Time: 1036-1050 OT Time Calculation (min): 14 min  Charges: OT General Charges $OT Visit: 1 Visit OT Treatments $Therapeutic Activity: 8-22 mins  Larraine Colas M.S. OTR/L  03/11/24, 12:07 PM

## 2024-03-11 NOTE — Plan of Care (Signed)

## 2024-03-11 NOTE — Progress Notes (Signed)
 PROGRESS NOTE    Tracy Jennings  FMW:[id number] DOB: 07/01/1956 DOA: 03/05/2024 PCP: Zachary Idelia LABOR, MD  Chief Complaint  Patient presents with   Code Stroke    Hospital Course:  This patient is a 68 year old female with ESRD on HD MWF, HTN, HLD, DM, PVD, dementia, chronic recurrent pancreatitis, stroke, cervical cancer, HIT, who presents with slurred speech, left facial spasm, left-sided weakness.  Patient had syncopal episode on 6/23 and was seen in the ED.  At that time she was found to have O2 desaturation to 80% due to pulmonary edema on chest x-ray as well as hyperkalemia with a potassium of 6.2.  She had negative head CT during that admission.  She received hemodialysis and then was discharged home.  Her last known normal was 6/24 at 1700.  Upon arrival to the ED patient's confusion had resolved entirely.  Labs in the ED mostly unremarkable, potassium 4.6, creatinine consistent with ESRD.  Blood pressure was found to be 252/79.  Patient was placed in telemetry bed for observation.  Code stroke was called.Blood pressure resolved.  Head CT was without acute intracranial abnormality.  Chronic infarct seen on right parietal lobe and extensive chronic microvascular ischemic changes with remote lacunar infarcts in the right caudate and right thalamus appreciated.  Patient was admitted for workup of presumed TIA.  LDL 96, hemoglobin A1c 5.1%.  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  MRI brain revealed chronic encephalomalacia within the right parietal lobe and advanced diffuse cerebral white matter disease.  Patient underwent carotid Dopplers which revealed elevated velocity within the distal right internal carotid artery suggesting stenosis of 50 to 69% though no significant plaque identified and thus this may be artifactual. Patient underwent hemodialysis on 6/25 but left her session early AMA.  When she returned to the room she reported that she wanted to leave  AGAINST MEDICAL ADVICE.  I had extensive discussion with the patient at bedside.  Also present for this discussion was nurse, Camillia, and patient's 2 sisters.  At the time my evaluation patient was able to demonstrate medical capacity.  She was alert and oriented x 4 and appropriately repeated my concerns believing the hospital. However, when patient attempted to sign AMA paperwork her signature was illegible.  She began getting upset and responding to internal stimuli.  Her confusion increased to the point that it was no longer safe to discharge.  Psychiatry was consulted.  Psychiatry met with the patient on 6/26 and confirms acute delirium, currently the patient does not have capacity to make medical decisions.  She has been started on Seroquel .    Subjective: No acute events overnight. On evaluation today patient is working well with physical therapy.   Objective: Vitals:   03/11/24 1200 03/11/24 1405 03/11/24 1420 03/11/24 1430  BP: (!) 147/61 (!) 136/52 (!) 147/61 137/87  Pulse: 66 (!) 58 (!) 59 61  Resp: 19 13 (!) 8 12  Temp: 98.4 F (36.9 C) (!) 97.5 F (36.4 C)    TempSrc:      SpO2: 91%  92% 97%  Weight:  57.4 kg    Height:       No intake or output data in the 24 hours ending 03/11/24 1510   Filed Weights   03/08/24 0733 03/08/24 1122 03/11/24 1405  Weight: 58.8 kg 57.8 kg 57.4 kg   Examination: General exam: Appears calm and comfortable, NAD  Respiratory system: No work of breathing, symmetric chest wall expansion Cardiovascular  system: S1 & S2 heard, RRR.  Gastrointestinal system: Abdomen is nondistended, soft and nontender.  Neuro: Alert, oriented with prompting.  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  Throughout the stay her clinical picture is more consistent with acute delirium likely with underlying dementia. - MRA: Moderate stenosis of proximal basilar artery at 50% - MRI brain: Chronic encephalomalacia with the right parietal lobe and advanced diffuse cerebral white matter disease - Carotid Dopplers: Elevated velocity within the distal right internal carotid artery suggesting stenosis of 50 to 69% though no significant plaque identified and thus may be artifactual. - EEG: Cortical dysfunction arising from right temporal parietal region secondary to underlying structural abnormality.  No seizures or epileptiform discharges. - Echocardiogram: EF 55 to 60%, no regional wall motion abnormalities, severe left ventricular hypertrophy, grade 2 diastolic dysfunction elevated pulmonary arterial systolic pressures.  Calcification of aortic valve - Continue statin, continue aspirin . - No events on telemetry.  Will discontinue further monitoring - Lipids and A1c at goal. - Neurology consulted.  Appreciate recommendations  Dementia with behavioral disturbance - Remote history of dementia. No outpatient work up. - Mentation and capacity changed regularly - Psychiatry consulted, patient was initiated on Seroquel  but had continued episodes of agitation.  Seroquel  was increased on 6/29.  Appears less agitated and more adherent with medications today - Please see psychiatry notes for more details - Presently patient does not have capacity to make medical decisions or leave AMA.  We have significant concerns about her ability to take care of herself at home.  She will likely require placement for safety  Hypertensive urgency - Systolic blood  pressure severely elevated above 200. - Resume all home meds (patient listed on amlodipine  but also has allergy.  On alternative CCB.) - Continue daily blood pressure titration.  Much improved when she takes all medications.  Medication adherence currently related to her underlying dementia.  Pulmonary arterial hypertension Grade 2 diastolic dysfunction - As seen on echocardiogram - Continue GDMT - Tight blood pressure control as able - Remains clinically euvolemic.  Volume to be removed via dialysis if needed  ESRD Electrolyte disturbance - On HD MWF - Nephrology consulted during this admission, continue with dialysis per renal  Elevated troponin - No chest pain, no shortness of breath.  Troponin 41 -> 43. likely secondary to demand ischemia and ESRD - Continue aspirin  and Lipitor  Type 2 diabetes with renal manifestations - Recent hemoglobin A1c 5.1%, well-controlled.  Not currently taking medications - Daily CBG, currently at goal  Anemia of chronic disease, ESRD - Trend CBC. - Hemoglobin currently stable  Hyperlipidemia - Statin  Heparin -induced thrombocytopenia - Avoid heparin  or Lovenox  products for now - SCDs as needed  DVT prophylaxis: SCDs   Code Status: Full Code Disposition:  Inpatient, needs placement. TOC consulted to arrange Consultants:    Procedures:    Antimicrobials:  Anti-infectives (From admission, onward)    None       Data Reviewed: I have personally reviewed following labs and imaging studies  CBC: Recent Labs  Lab 03/05/24 1810 03/08/24 0802  WBC 4.3 4.0  NEUTROABS 2.8  --   HGB 10.9* 10.2*  HCT 33.9* 31.0*  MCV 94.2 92.5  PLT 150 144*   Basic Metabolic Panel: Recent Labs  Lab 03/05/24 1810 03/08/24 0802  NA 135 133*  K 4.6 4.4  CL 95* 94*  CO2 26 27  GLUCOSE 100* 120*  BUN 47* 42*  CREATININE 7.33* 7.84*  CALCIUM  9.2 8.6*  PHOS  --  6.2*   GFR: Estimated Creatinine Clearance: 5.8 mL/min (A) (by C-G formula based  on SCr of 7.84 mg/dL (H)). Liver Function Tests: Recent Labs  Lab 03/05/24 1810 03/08/24 0802  AST 23  --   ALT 14  --   ALKPHOS 29*  --   BILITOT 0.7  --   PROT 6.9  --   ALBUMIN 3.5 3.3*   CBG: Recent Labs  Lab 03/07/24 0724 03/09/24 0808 03/10/24 0728 03/11/24 0738  GLUCAP 112* 100* 107* 101*    No results found for this or any previous visit (from the past 240 hours).   Radiology Studies: No results found.   Scheduled Meds:  aspirin   300 mg Rectal Daily   Or   aspirin   325 mg Oral Daily   atorvastatin   40 mg Oral Daily   calcium  acetate  667 mg Oral TID WC   carvedilol   25 mg Oral BID WC   Chlorhexidine  Gluconate Cloth  6 each Topical Daily   Chlorhexidine  Gluconate Cloth  6 each Topical Q0600   cloNIDine   0.3 mg Oral TID   diltiazem   120 mg Oral Daily   doxazosin   4 mg Oral Daily   isosorbide  mononitrate  120 mg Oral Daily   losartan   100 mg Oral Daily   QUEtiapine   25 mg Oral TID   Continuous Infusions:   LOS: 4 days  MDM: Patient is high risk for one or more organ failure.  They necessitate ongoing hospitalization for continued IV therapies and subsequent lab monitoring. Total time spent interpreting labs and vitals, reviewing the medical record, coordinating care amongst consultants and care team members, directly assessing and discussing care with the patient and/or family: 35 min  Margery Szostak, DO [facility]  To contact the attending physician between 7A-7P please use Epic Chat. To contact the covering physician during after hours 7P-7A, please review Amion.  03/11/2024, 3:10 PM   *This document has been created with the assistance of dictation software. Please excuse typographical errors. *

## 2024-03-11 NOTE — Plan of Care (Signed)
  Problem: Ischemic Stroke/TIA Tissue Perfusion: Goal: Complications of ischemic stroke/TIA will be minimized Outcome: Progressing   Problem: Health Behavior/Discharge Planning: Goal: Goals will be collaboratively established with patient/family Outcome: Progressing   Problem: Nutrition: Goal: Risk of aspiration will decrease Outcome: Progressing   Problem: Clinical Measurements: Goal: Will remain free from infection Outcome: Progressing Goal: Cardiovascular complication will be avoided Outcome: Progressing   Problem: Activity: Goal: Risk for activity intolerance will decrease Outcome: Progressing   Problem: Nutrition: Goal: Adequate nutrition will be maintained Outcome: Progressing   Problem: Safety: Goal: Ability to remain free from injury will improve Outcome: Progressing

## 2024-03-11 NOTE — Progress Notes (Signed)
   03/11/24 1747  Vitals  Temp 98.1 F (36.7 C)  Pulse Rate 62  Resp (!) 8  BP (!) 164/50  SpO2 96 %  O2 Device Room Air  Weight 56.6 kg  Type of Weight Post-Dialysis  Oxygen Therapy  Patient Activity (if Appropriate) In bed  Pulse Oximetry Type Continuous  Oximetry Probe Site Changed No  Post Treatment  Dialyzer Clearance Lightly streaked  Hemodialysis Intake (mL) 0 mL  Liters Processed 61.8  Fluid Removed (mL) 800 mL  Tolerated HD Treatment Yes  AVG/AVF Arterial Site Held (minutes) 5 minutes  AVG/AVF Venous Site Held (minutes) 5 minutes   Received patient in bed to unit.  Alert Informed consent signed and in chart.   TX duration: Three hours.  Patient tolerated well.  Transported back to the room  Alert, without acute distress.  Hand-off given to patient's nurse.   Access used: Left upper arm fistula. Access issues: None

## 2024-03-11 NOTE — Progress Notes (Signed)
 Physical Therapy Treatment Patient Details Name: Tracy Jennings MRN: [id number] DOB: 01/01/1956 Today's Date: 03/11/2024   History of Present Illness Pt is a 68 year old female presents with slurred speech,  left facial spasm, left sided weakness.    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 Pt required VC's throughout ambulation for proper hand placement on RW and to maintain RW within BOS with minimal pt carryover. Pt demonstrated flexed trunk over RW during ambulation and was limited in ambulation distance, due to LBP that increased throughout ambulation. Pt required frequent standing rest breaks during ambulation and a chair follow was utilized for pt safety. Pt reported no adverse respiratory symptoms during the session with SpO2 and HR WNL throughout on room air. Pt will benefit from continued PT services upon discharge to safely address deficits listed in patient problem list for decreased caregiver assistance and eventual return to PLOF.     If plan is discharge home, recommend the following: A little help with walking and/or transfers;A little help with bathing/dressing/bathroom;Assistance with cooking/housework;Assist for transportation;Help with stairs or ramp for entrance;Supervision due to cognitive status   Can travel by private vehicle        Equipment Recommendations  Rolling walker (2 wheels)    Recommendations for Other Services       Precautions / Restrictions Precautions Precautions: Fall Recall of Precautions/Restrictions: Impaired Restrictions Weight Bearing Restrictions Per Provider Order: No     Mobility  Bed Mobility Overal bed mobility: Needs Assistance Bed Mobility: Supine to Sit, Sit to Supine     Supine to sit:  Supervision Sit to supine: Supervision   General bed mobility comments: Pt required supervision for all bed mobility tasks with VC's for direction to scoot towards head of bed    Transfers Overall transfer level: Needs assistance Equipment used: Rolling walker (2 wheels) Transfers: Sit to/from Stand Sit to Stand: Contact guard assist           General transfer comment: Pt required multimodal cues for hand placement to push off from EOB to complete STS and where to place hands on RW once standing, but no physical assistance required    Ambulation/Gait Ambulation/Gait assistance: Min assist, +2 safety/equipment Gait Distance (Feet): 30 Feet x 1,  Assistive device: Rolling walker (2 wheels) Gait Pattern/deviations: Trunk flexed, Shuffle, Step-through pattern, Decreased stride length, Knee flexed in stance - left, Knee flexed in stance - right, Drifts right/left Gait velocity: decreased     General Gait Details: Pt required occasional VC's for direction to ambulate in and required frequent standing rest breaks due to LBP. After each standing rest break, pt required VC's for proper hand placement on RW. Pt also required constant VC's throughout ambulation to keep RW within BOS, as pt would tend to push RW forward beyond BOS. A chair follow was utilized for pt safety.    Stairs             Wheelchair Mobility     Tilt Bed    Modified Rankin (Stroke Patients Only)       Balance Overall balance assessment: Needs assistance Sitting-balance support: Feet supported, No upper extremity supported Sitting balance-Leahy Scale: Good     Standing balance support: Bilateral upper extremity supported, During functional activity, Reliant on assistive device for balance Standing balance-Leahy  Scale: Fair Standing balance comment: Pt is able to maintain balance in stance and ambulation with RW, but requiring constant VC's to keep RW within BOS                             Communication Communication Communication: No apparent difficulties  Cognition Arousal: Alert Behavior During Therapy: Flat affect   PT - Cognitive impairments: History of cognitive impairments                         Following commands: Impaired Following commands impaired: Follows one step commands inconsistently    Cueing Cueing Techniques: Verbal cues, Gestural cues, Tactile cues, Visual cues  Exercises      General Comments        Pertinent Vitals/Pain Pain Assessment Pain Assessment: No/denies pain    Home Living                          Prior Function            PT Goals (current goals can now be found in the care plan section) Progress towards PT goals: Progressing toward goals    Frequency    Min 2X/week      PT Plan      Co-evaluation              AM-PAC PT 6 Clicks Mobility   Outcome Measure  Help needed turning from your back to your side while in a flat bed without using bedrails?: None Help needed moving from lying on your back to sitting on the side of a flat bed without using bedrails?: None Help needed moving to and from a bed to a chair (including a wheelchair)?: A Little Help needed standing up from a chair using your arms (e.g., wheelchair or bedside chair)?: A Little Help needed to walk in hospital room?: A Little Help needed climbing 3-5 steps with a railing? : A Lot 6 Click Score: 19    End of Session Equipment Utilized During Treatment: Gait belt Activity Tolerance: Patient limited by pain Patient left: with bed alarm set;with call bell/phone within reach;in bed Nurse Communication: Mobility status PT Visit Diagnosis: Muscle weakness (generalized) (M62.81);Difficulty in walking, not elsewhere classified (R26.2)     Time: 8986-8967 PT Time Calculation (min) (ACUTE ONLY): 19 min  Charges:                            Leontine Ingles, SPT 03/11/24, 1:24 PM

## 2024-03-11 NOTE — Progress Notes (Signed)
 [facility]  ROUNDING NOTE   Subjective:   Tracy Jennings is a 68 year old female with past medical conditions including dementia, hypertension, diabetes, hyperlipidemia, cervical cancer, recurrent pancreatitis, PVD, and end-stage renal disease on hemodialysis.  Patient presents to the emergency department after experiencing slurred speech, left-sided facial spasms and weakness.  Patient has been admitted for TIA (transient ischemic attack) [G45.9] Hallucination [R44.3]  Patient is known to our practice and receives outpatient dialysis treatments at [facility] on a MWF schedule, supervised by Dr. Dennise.   Update: Patient seen sitting up in bed Eating breakfast Continues to state she's ready for discharge Becomes tearful  Dialysis scheduled for later today   Objective:  Vital signs in last 24 hours:  Temp:  [97.9 F (36.6 C)-98.9 F (37.2 C)] 98 F (36.7 C) (06/30 0739) Pulse Rate:  [61-71] 65 (06/30 0739) Resp:  [16-20] 18 (06/30 0739) BP: (138-223)/(40-99) 157/53 (06/30 0739) SpO2:  [91 %-97 %] 91 % (06/30 0739)  Weight change:  Filed Weights   03/06/24 1228 03/08/24 0733 03/08/24 1122  Weight: 54.4 kg 58.8 kg 57.8 kg    Intake/Output: I/O last 3 completed shifts: In: 120 [P.O.:120] Out: -    Intake/Output this shift:  No intake/output data recorded.  Physical Exam: General: NAD  Head: Normocephalic, atraumatic. Moist oral mucosal membranes  Eyes: Anicteric  Neck: Supple  Lungs:  Clear to auscultation, normal effort  Heart: Regular rate and rhythm  Abdomen:  Soft, nontender  Extremities: No peripheral edema.  Neurologic: Awake, alert, conversant  Skin: Warm,dry, no rash  Access: Left aVF    Basic Metabolic Panel: Recent Labs  Lab 03/04/24 1239 03/05/24 1810 03/08/24 0802  NA 137 135 133*  K 6.2* 4.6 4.4  CL 100 95* 94*  CO2 23 26 27   GLUCOSE 80 100* 120*  BUN 72* 47* 42*  CREATININE 9.67* 7.33* 7.84*  CALCIUM  8.7*  9.2 8.6*  PHOS  --   --  6.2*    Liver Function Tests: Recent Labs  Lab 03/04/24 1239 03/05/24 1810 03/08/24 0802  AST 26 23  --   ALT 16 14  --   ALKPHOS 30* 29*  --   BILITOT 0.9 0.7  --   PROT 7.2 6.9  --   ALBUMIN 3.7 3.5 3.3*   No results for input(s): LIPASE, AMYLASE in the last 168 hours. No results for input(s): AMMONIA in the last 168 hours.  CBC: Recent Labs  Lab 03/04/24 1315 03/05/24 1810 03/08/24 0802  WBC 5.6 4.3 4.0  NEUTROABS 4.4 2.8  --   HGB 10.8* 10.9* 10.2*  HCT 34.0* 33.9* 31.0*  MCV 94.2 94.2 92.5  PLT 129* 150 144*    Cardiac Enzymes: No results for input(s): CKTOTAL, CKMB, CKMBINDEX, TROPONINI in the last 168 hours.  BNP: Invalid input(s): POCBNP  CBG: Recent Labs  Lab 03/07/24 0724 03/09/24 0808 03/10/24 0728 03/11/24 0738  GLUCAP 112* 100* 107* 101*    Microbiology: Results for orders placed or performed during the hospital encounter of 09/14/22  Resp panel by RT-PCR (RSV, Flu A&B, Covid) Anterior Nasal Swab     Status: None   Collection Time: 09/14/22  2:45 AM   Specimen: Anterior Nasal Swab  Result Value Ref Range Status   SARS Coronavirus 2 by RT PCR NEGATIVE NEGATIVE Final    Comment: (NOTE) SARS-CoV-2 target nucleic acids are NOT DETECTED.  The SARS-CoV-2 RNA is generally detectable in upper respiratory specimens during the acute phase of infection.  The lowest concentration of SARS-CoV-2 viral copies this assay can detect is 138 copies/mL. A negative result does not preclude SARS-Cov-2 infection and should not be used as the sole basis for treatment or other patient management decisions. A negative result may occur with  improper specimen collection/handling, submission of specimen other than nasopharyngeal swab, presence of viral mutation(s) within the areas targeted by this assay, and inadequate number of viral copies(<138 copies/mL). A negative result must be combined with clinical observations,  patient history, and epidemiological information. The expected result is Negative.  Fact Sheet for Patients:  BloggerCourse.com  Fact Sheet for Healthcare Providers:  SeriousBroker.it  This test is no t yet approved or cleared by the [place]  FDA and  has been authorized for detection and/or diagnosis of SARS-CoV-2 by FDA under an Emergency Use Authorization (EUA). This EUA will remain  in effect (meaning this test can be used) for the duration of the COVID-19 declaration under Section 564(b)(1) of the Act, 21 U.S.C.section 360bbb-3(b)(1), unless the authorization is terminated  or revoked sooner.       Influenza A by PCR NEGATIVE NEGATIVE Final   Influenza B by PCR NEGATIVE NEGATIVE Final    Comment: (NOTE) The Xpert Xpress SARS-CoV-2/FLU/RSV plus assay is intended as an aid in the diagnosis of influenza from Nasopharyngeal swab specimens and should not be used as a sole basis for treatment. Nasal washings and aspirates are unacceptable for Xpert Xpress SARS-CoV-2/FLU/RSV testing.  Fact Sheet for Patients: BloggerCourse.com  Fact Sheet for Healthcare Providers: SeriousBroker.it  This test is not yet approved or cleared by the [place]  FDA and has been authorized for detection and/or diagnosis of SARS-CoV-2 by FDA under an Emergency Use Authorization (EUA). This EUA will remain in effect (meaning this test can be used) for the duration of the COVID-19 declaration under Section 564(b)(1) of the Act, 21 U.S.C. section 360bbb-3(b)(1), unless the authorization is terminated or revoked.     Resp Syncytial Virus by PCR NEGATIVE NEGATIVE Final    Comment: (NOTE) Fact Sheet for Patients: BloggerCourse.com  Fact Sheet for Healthcare Providers: SeriousBroker.it  This test is not yet approved or cleared by the [place] FDA and has been authorized for detection and/or diagnosis of SARS-CoV-2 by FDA under an Emergency Use Authorization (EUA). This EUA will remain in effect (meaning this test can be used) for the duration of the COVID-19 declaration under Section 564(b)(1) of the Act, 21 U.S.C. section 360bbb-3(b)(1), unless the authorization is terminated or revoked.  Performed at [facility], [address] [place], [place] [address]     Coagulation Studies: No results for input(s): LABPROT, INR in the last 72 hours.   Urinalysis: No results for input(s): COLORURINE, LABSPEC, PHURINE, GLUCOSEU, HGBUR, BILIRUBINUR, KETONESUR, PROTEINUR, UROBILINOGEN, NITRITE, LEUKOCYTESUR in the last 72 hours.  Invalid input(s): APPERANCEUR    Imaging: No results found.    Medications:     aspirin   300 mg Rectal Daily   Or   aspirin   325 mg Oral Daily   atorvastatin   40 mg Oral Daily   calcium  acetate  667 mg Oral TID WC   carvedilol   25 mg Oral BID WC   Chlorhexidine  Gluconate Cloth  6 each Topical Daily   Chlorhexidine  Gluconate Cloth  6 each Topical Q0600   cloNIDine   0.3 mg Oral TID   diltiazem   120 mg Oral Daily   doxazosin   4 mg Oral Daily   isosorbide  mononitrate  120 mg Oral Daily   losartan   100 mg Oral Daily   QUEtiapine   25 mg Oral TID   acetaminophen  **OR** acetaminophen  (TYLENOL ) oral liquid 160 mg/5 mL **OR** acetaminophen , hydrALAZINE , labetalol , LORazepam , ondansetron  (ZOFRAN ) IV, QUEtiapine , senna-docusate, sodium chloride  flush  Assessment/ Plan:  [name] is a 68 y.o.  female with past medical conditions including dementia, hypertension, diabetes, hyperlipidemia, cervical cancer, recurrent pancreatitis, PVD, and end-stage renal disease on hemodialysis.  Patient presents to the emergency department after experiencing slurred speech, left-sided facial spasms and weakness.  Patient has been admitted for TIA (transient ischemic  attack) [G45.9] Hallucination [R44.3]  [facility]/MWF/Lt AVG    End-stage renal disease on hemodialysis.  Dialysis scheduled for later today. Monitoring discharge plan to determine any outpatient needs.   Transient ischemic attack, reporting symptoms of slurred speech, involuntary left-sided facial spasms and weakness.  Symptoms resolved.  Neurology consulted, TIA versus partial complex seizure.  Brain imaging negative for acute infarct.  Carotid ultrasound shows right-sided 50 to 60% stenosis without plaque.    3.  Hypertensive urgency, blood pressure severely elevated on admission.  Home regimen includes amlodipine , carvedilol , doxazosin , losartan , and isosorbide .  Currently receiving carvedilol , clonidine , diltiazem , doxazosin , isosorbide , and losartan .  Amlodipine  remains held.  Blood pressure 138/40, stable  4. Secondary Hyperparathyroidism: with outpatient labs:   Lab Results  Component Value Date   CALCIUM  8.6 (L) 03/08/2024   PHOS 6.2 (H) 03/08/2024    Continue calcium  acetate with meals. Calcium  stable, will obtain updated phos with dialysis later today.     LOS: 4 Eulalio Reamy 6/30/202510:59 AM

## 2024-03-12 DIAGNOSIS — G459 Transient cerebral ischemic attack, unspecified: Secondary | ICD-10-CM | POA: Diagnosis not present

## 2024-03-12 LAB — GLUCOSE, CAPILLARY: Glucose-Capillary: 118 mg/dL — ABNORMAL HIGH (ref 70–99)

## 2024-03-12 MED ORDER — HALOPERIDOL LACTATE 5 MG/ML IJ SOLN
1.0000 mg | Freq: Four times a day (QID) | INTRAMUSCULAR | Status: DC | PRN
  Administered 2024-03-12 – 2024-03-18 (×5): 2 mg via INTRAMUSCULAR
  Filled 2024-03-12 (×5): qty 1

## 2024-03-12 MED ORDER — TRAZODONE HCL 50 MG PO TABS
25.0000 mg | ORAL_TABLET | Freq: Every evening | ORAL | Status: DC | PRN
  Administered 2024-03-12 – 2024-03-17 (×3): 25 mg via ORAL
  Filled 2024-03-12 (×5): qty 1

## 2024-03-12 NOTE — Plan of Care (Signed)
  Problem: Ischemic Stroke/TIA Tissue Perfusion: Goal: Complications of ischemic stroke/TIA will be minimized Outcome: Progressing   Problem: Nutrition: Goal: Risk of aspiration will decrease Outcome: Progressing   Problem: Clinical Measurements: Goal: Will remain free from infection Outcome: Progressing Goal: Diagnostic test results will improve Outcome: Progressing Goal: Respiratory complications will improve Outcome: Progressing Goal: Cardiovascular complication will be avoided Outcome: Progressing   Problem: Activity: Goal: Risk for activity intolerance will decrease Outcome: Progressing   Problem: Nutrition: Goal: Adequate nutrition will be maintained Outcome: Progressing   Problem: Coping: Goal: Level of anxiety will decrease Outcome: Progressing   Problem: Elimination: Goal: Will not experience complications related to bowel motility Outcome: Progressing Goal: Will not experience complications related to urinary retention Outcome: Progressing   Problem: Pain Managment: Goal: General experience of comfort will improve and/or be controlled Outcome: Progressing   Problem: Safety: Goal: Ability to remain free from injury will improve Outcome: Progressing   Problem: Skin Integrity: Goal: Risk for impaired skin integrity will decrease Outcome: Progressing

## 2024-03-12 NOTE — Progress Notes (Signed)
 Mobility Specialist - Progress Note   03/12/24 1100  Mobility  Activity Ambulated with assistance in room;Transferred from bed to chair;Transferred from chair to bed;Dangled on edge of bed  Level of Assistance Moderate assist, patient does 50-74%  Assistive Device None;Front wheel walker  Distance Ambulated (ft) 10 ft  Activity Response Tolerated well  Mobility visit 1 Mobility     Pt lying horizontally in bed with legs over rails upon arrival. Rails lowered and bed straightened for better balanced sitting. Pt stood with minA and took several steps in room with modA. Heavy multimodal cueing for navigation and safety of all tasks. Does constantly run into wall without ability to self-correct. Difficult to determine if patient has visual deficits but noted that pt does not follow direction of sound. Also still appears to be actively hallucinating; seeing multiple people in room. Pt assisted into new personal brief/gown before returning to bed. Alarm set, needs in reach.    Lennette Seip Mobility Specialist 03/12/24, 11:45 AM

## 2024-03-12 NOTE — Progress Notes (Signed)
 Pt impulsive, not easily redirected, anxious and confused PRN haldol  given

## 2024-03-12 NOTE — Plan of Care (Signed)
  Problem: Ischemic Stroke/TIA Tissue Perfusion: Goal: Complications of ischemic stroke/TIA will be minimized Outcome: Progressing   Problem: Nutrition: Goal: Risk of aspiration will decrease Outcome: Progressing   Problem: Clinical Measurements: Goal: Will remain free from infection Outcome: Progressing   Problem: Activity: Goal: Risk for activity intolerance will decrease Outcome: Progressing   Problem: Safety: Goal: Ability to remain free from injury will improve Outcome: Progressing

## 2024-03-12 NOTE — TOC Progression Note (Signed)
 Transition of Care [facility]) - Progression Note    Patient Details  Name: Tracy Jennings MRN: [id number] Date of Birth: 1955-10-25  [facility] of Care [facility]) CM/SW Contact  Elouise LULLA Capri, RN 03/12/2024, 10:54 AM  Clinical Narrative:     Alert received from Alan SQUIBB, RN regarding discharge care planning. Noted, 2 accepting SNFs, [facility] and [facility]. CM call to Tammy, Admissions, [facility] [place], phone: [phone] regarding bed offer. No answer, CM left message for return call. CM call to patient's sister, [name], phone: [phone] regarding 2 accepting SNFs. No answer, CM left message for return call.   Expected Discharge Plan: Skilled Nursing Facility Barriers to Discharge: Continued Medical Work up  Expected Discharge Plan and Services   Discharge Planning Services: CM Consult   Living arrangements for the past 2 months: Single Family Home   Social Determinants of Health (SDOH) Interventions SDOH Screenings   Food Insecurity: Food Insecurity Present (03/06/2024)  Housing: Low Risk  (03/06/2024)  Transportation Needs: No Transportation Needs (03/06/2024)  Utilities: Not At Risk (03/06/2024)  Financial Resource Strain: Low Risk  (01/09/2024)   Received from [facility]  Social Connections: Unknown (03/06/2024)  Tobacco Use: Low Risk  (03/06/2024)    Readmission Risk Interventions     No data to display

## 2024-03-12 NOTE — Progress Notes (Signed)
 PT Cancellation Note  Patient Details Name: Tracy Jennings MRN: [id number] DOB: [birth year]   Cancelled Treatment:    Reason Eval/Treat Not Completed: Fatigue/lethargy limiting ability to participate Patient sleeping soundly. RN states she was up all night. Will re-attempt at later time.   Khaalid Lefkowitz 03/12/2024, 11:24 AM

## 2024-03-12 NOTE — Progress Notes (Signed)
 PROGRESS NOTE    Tracy Jennings  FMW:[id number] DOB: 1955-11-18 DOA: 03/05/2024 PCP: Zachary Idelia LABOR, MD  Chief Complaint  Patient presents with   Code Stroke    Hospital Course:  This patient is a 68 year old female with ESRD on HD MWF, HTN, HLD, DM, PVD, dementia, chronic recurrent pancreatitis, stroke, cervical cancer, HIT, who presents with slurred speech, left facial spasm, left-sided weakness.  Patient had syncopal episode on 6/23 and was seen in the ED.  At that time she was found to have O2 desaturation to 80% due to pulmonary edema on chest x-ray as well as hyperkalemia with a potassium of 6.2.  She had negative head CT during that admission.  She received hemodialysis and then was discharged home.  Her last known normal was 6/24 at 1700.  Upon arrival to the ED patient's confusion had resolved entirely.  Labs in the ED mostly unremarkable, potassium 4.6, creatinine consistent with ESRD.  Blood pressure was found to be 252/79.  Patient was placed in telemetry bed for observation.  Code stroke was called.Blood pressure resolved.  Head CT was without acute intracranial abnormality.  Chronic infarct seen on right parietal lobe and extensive chronic microvascular ischemic changes with remote lacunar infarcts in the right caudate and right thalamus appreciated.  Patient was admitted for workup of presumed TIA.  LDL 96, hemoglobin A1c 5.1%.  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  MRI brain revealed chronic encephalomalacia within the right parietal lobe and advanced diffuse cerebral white matter disease.  Patient underwent carotid Dopplers which revealed elevated velocity within the distal right internal carotid artery suggesting stenosis of 50 to 69% though no significant plaque identified and thus this may be artifactual. Patient underwent hemodialysis on 6/25 but left her session early AMA.she attempted to leave the hospital AMA but during this event began  hallucinating and loss capacity.  Psychiatry was consulted and met with the patient on 6/26 and confirms acute delirium, currently the patient does not have capacity to make medical decisions.  She has been started on Seroquel  for presumed Lewy body dementia.  She has had persistent agitation and Seroquel  has been slowly increased.  Subjective: Patient was reportedly agitated all night.  She is still struggling with hallucinations.  She required Haldol  this morning.  This morning upon my evaluation is very drowsy. Bedside RN reports this is the first time she has rested   Objective: Vitals:   03/11/24 2123 03/12/24 0616 03/12/24 0741 03/12/24 1141  BP: (!) 163/51 (!) 182/53 (!) 197/76 (!) 174/61  Pulse: 64 65 66 65  Resp: 18 17 16 16   Temp: 97.9 F (36.6 C) 98.1 F (36.7 C) 98 F (36.7 C) 97.9 F (36.6 C)  TempSrc:    Axillary  SpO2: 100% 96% 99% 98%  Weight:      Height:        Intake/Output Summary (Last 24 hours) at 03/12/2024 1430 Last data filed at 03/12/2024 1420 Gross per 24 hour  Intake 0 ml  Output 800 ml  Net -800 ml     Filed Weights   03/08/24 1122 03/11/24 1405 03/11/24 1747  Weight: 57.8 kg 57.4 kg 56.6 kg   Examination: General exam: Appears calm and comfortable, NAD  Respiratory system: No work of breathing, symmetric chest wall expansion Cardiovascular system: S1 & S2 heard, RRR.  Gastrointestinal system: Abdomen is nondistended, soft and nontender.  Neuro: Drowsy but arousable with stimulation. 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 No outpatient work up.  Presumed Lewy body dementia at this time - Mentation and capacity continue to change regularly - Psychiatry has been consulted - Patient is currently on Seroquel  but has continued episodes of agitation and persistent hallucinations.  Seroquel  was increased last on 6/29.  When patient is adherent to medication she does do better.  Intermittently refuses - As needed Haldol .  Recruitment consultant. - Please see psychiatry notes for more details, appreciate further psychiatry recommendations.- Presently patient does not have capacity to make medical decisions or leave AMA.  We have significant concerns about her ability to take care of herself at home.  She will likely require placement for safety  Hypertensive urgency - Systolic blood pressure severely elevated above 200. - Resume all home meds (patient listed on amlodipine  but also has allergy.  On alternative CCB.) - Continue daily blood pressure titration.  Much improved when she takes all medications.  Medication adherence currently limited by her underlying dementia.  TIA rule out - Patient initially presented with concern for TIA versus seizure.  Throughout the stay her clinical picture is more consistent with acute delirium likely with underlying dementia. - MRA: Moderate stenosis of proximal basilar artery at 50% - MRI brain: Chronic encephalomalacia with the right parietal lobe and advanced diffuse cerebral white matter disease - Carotid Dopplers: Elevated velocity within the distal right internal carotid artery suggesting stenosis of 50 to 69% though no significant plaque identified and thus may be artifactual. - EEG: Cortical dysfunction arising from right temporal parietal region secondary to underlying structural abnormality.  No seizures or epileptiform discharges. - Echocardiogram: EF 55 to 60%, no regional wall motion abnormalities,  severe left ventricular hypertrophy, grade 2 diastolic dysfunction elevated pulmonary arterial systolic pressures.  Calcification of aortic valve - Continue statin, continue aspirin . - No events on telemetry.  Have discontinued further monitoring - Lipids and A1c at goal. - Neurology consulted.  Believes patient may have Lewy body dementia as above  Pulmonary arterial hypertension Grade 2 diastolic dysfunction - As seen on echocardiogram - Continue GDMT - Tight blood pressure control as able - Remains clinically euvolemic. Volume to be removed via dialysis if needed  ESRD Electrolyte disturbance - On HD MWF - Nephrology consulted during this admission, continue with dialysis per renal  Elevated troponin - No chest pain, no shortness of breath.  Troponin 41 -> 43. likely secondary to demand ischemia and ESRD - Continue aspirin  and Lipitor  Type 2 diabetes with renal manifestations - Recent hemoglobin A1c 5.1%, well-controlled.  Not currently taking medications - Daily CBG, currently at goal  Anemia of chronic disease, ESRD - Trend CBC. - Hemoglobin currently stable  Hyperlipidemia - Statin  Heparin -induced thrombocytopenia - Avoid heparin  or Lovenox  products  - SCDs as needed  DVT prophylaxis: SCDs   Code Status: Full Code Disposition:  Inpatient, needs placement. TOC consulted to arrange. Awaiting updates Consultants:    Procedures:    Antimicrobials:  Anti-infectives (From admission, onward)    None       Data  Reviewed: I have personally reviewed following labs and imaging studies CBC: Recent Labs  Lab 03/05/24 1810 03/08/24 0802  WBC 4.3 4.0  NEUTROABS 2.8  --   HGB 10.9* 10.2*  HCT 33.9* 31.0*  MCV 94.2 92.5  PLT 150 144*   Basic Metabolic Panel: Recent Labs  Lab 03/05/24 1810 03/08/24 0802  NA 135 133*  K 4.6 4.4  CL 95* 94*  CO2 26 27  GLUCOSE 100* 120*  BUN 47* 42*  CREATININE 7.33* 7.84*  CALCIUM  9.2 8.6*  PHOS  --  6.2*    GFR: Estimated Creatinine Clearance: 5.8 mL/min (A) (by C-G formula based on SCr of 7.84 mg/dL (H)). Liver Function Tests: Recent Labs  Lab 03/05/24 1810 03/08/24 0802  AST 23  --   ALT 14  --   ALKPHOS 29*  --   BILITOT 0.7  --   PROT 6.9  --   ALBUMIN 3.5 3.3*   CBG: Recent Labs  Lab 03/07/24 0724 03/09/24 0808 03/10/24 0728 03/11/24 0738 03/12/24 0742  GLUCAP 112* 100* 107* 101* 118*    No results found for this or any previous visit (from the past 240 hours).   Radiology Studies: No results found.   Scheduled Meds:  aspirin   300 mg Rectal Daily   Or   aspirin   325 mg Oral Daily   atorvastatin   40 mg Oral Daily   calcium  acetate  667 mg Oral TID WC   carvedilol   25 mg Oral BID WC   Chlorhexidine  Gluconate Cloth  6 each Topical Daily   Chlorhexidine  Gluconate Cloth  6 each Topical Q0600   cloNIDine   0.3 mg Oral TID   diltiazem   120 mg Oral Daily   doxazosin   4 mg Oral Daily   isosorbide  mononitrate  120 mg Oral Daily   losartan   100 mg Oral Daily   QUEtiapine   25 mg Oral TID   Continuous Infusions:   LOS: 5 days  MDM: Patient is high risk for one or more organ failure.  They necessitate ongoing hospitalization for continued IV therapies and subsequent lab monitoring. Total time spent interpreting labs and vitals, reviewing the medical record, coordinating care amongst consultants and care team members, directly assessing and discussing care with the patient and/or family: 35 min  Malesha Suliman, DO [facility]  To contact the attending physician between 7A-7P please use Epic Chat. To contact the covering physician during after hours 7P-7A, please review Amion.  03/12/2024, 2:30 PM   *This document has been created with the assistance of dictation software. Please excuse typographical errors. *

## 2024-03-12 NOTE — Progress Notes (Signed)
 [facility]  ROUNDING NOTE   Subjective:   Tracy Jennings is a 68 year old female with past medical conditions including dementia, hypertension, diabetes, hyperlipidemia, cervical cancer, recurrent pancreatitis, PVD, and end-stage renal disease on hemodialysis.  Patient presents to the emergency department after experiencing slurred speech, left-sided facial spasms and weakness.  Patient has been admitted for TIA (transient ischemic attack) [G45.9] Hallucination [R44.3]  Patient is known to our practice and receives outpatient dialysis treatments at [facility] on a MWF schedule, supervised by Dr. Dennise.   Update: Patient very drowsy today Arousable for very short periods of time Alert to self   Objective:  Vital signs in last 24 hours:  Temp:  [97.5 F (36.4 C)-98.1 F (36.7 C)] 97.9 F (36.6 C) (07/01 1141) Pulse Rate:  [56-66] 65 (07/01 1141) Resp:  [8-18] 16 (07/01 1141) BP: (83-197)/(39-87) 174/61 (07/01 1141) SpO2:  [92 %-100 %] 98 % (07/01 1141) Weight:  [56.6 kg-57.4 kg] 56.6 kg (06/30 1747)  Weight change:  Filed Weights   03/08/24 1122 03/11/24 1405 03/11/24 1747  Weight: 57.8 kg 57.4 kg 56.6 kg    Intake/Output: I/O last 3 completed shifts: In: -  Out: 800 [Other:800]   Intake/Output this shift:  No intake/output data recorded.  Physical Exam: General: NAD  Head: Normocephalic, atraumatic. Moist oral mucosal membranes  Eyes: Anicteric  Neck: Supple  Lungs:  Clear to auscultation, normal effort  Heart: Regular rate and rhythm  Abdomen:  Soft, nontender  Extremities: No peripheral edema.  Neurologic: Awake, alert, conversant  Skin: Warm,dry, no rash  Access: Left aVF    Basic Metabolic Panel: Recent Labs  Lab 03/05/24 1810 03/08/24 0802  NA 135 133*  K 4.6 4.4  CL 95* 94*  CO2 26 27  GLUCOSE 100* 120*  BUN 47* 42*  CREATININE 7.33* 7.84*  CALCIUM  9.2 8.6*  PHOS  --  6.2*    Liver Function Tests: Recent Labs   Lab 03/05/24 1810 03/08/24 0802  AST 23  --   ALT 14  --   ALKPHOS 29*  --   BILITOT 0.7  --   PROT 6.9  --   ALBUMIN 3.5 3.3*   No results for input(s): LIPASE, AMYLASE in the last 168 hours. No results for input(s): AMMONIA in the last 168 hours.  CBC: Recent Labs  Lab 03/05/24 1810 03/08/24 0802  WBC 4.3 4.0  NEUTROABS 2.8  --   HGB 10.9* 10.2*  HCT 33.9* 31.0*  MCV 94.2 92.5  PLT 150 144*    Cardiac Enzymes: No results for input(s): CKTOTAL, CKMB, CKMBINDEX, TROPONINI in the last 168 hours.  BNP: Invalid input(s): POCBNP  CBG: Recent Labs  Lab 03/07/24 0724 03/09/24 0808 03/10/24 0728 03/11/24 0738 03/12/24 0742  GLUCAP 112* 100* 107* 101* 118*    Microbiology: Results for orders placed or performed during the hospital encounter of 09/14/22  Resp panel by RT-PCR (RSV, Flu A&B, Covid) Anterior Nasal Swab     Status: None   Collection Time: 09/14/22  2:45 AM   Specimen: Anterior Nasal Swab  Result Value Ref Range Status   SARS Coronavirus 2 by RT PCR NEGATIVE NEGATIVE Final    Comment: (NOTE) SARS-CoV-2 target nucleic acids are NOT DETECTED.  The SARS-CoV-2 RNA is generally detectable in upper respiratory specimens during the acute phase of infection. The lowest concentration of SARS-CoV-2 viral copies this assay can detect is 138 copies/mL. A negative result does not preclude SARS-Cov-2 infection and should not be  used as the sole basis for treatment or other patient management decisions. A negative result may occur with  improper specimen collection/handling, submission of specimen other than nasopharyngeal swab, presence of viral mutation(s) within the areas targeted by this assay, and inadequate number of viral copies(<138 copies/mL). A negative result must be combined with clinical observations, patient history, and epidemiological information. The expected result is Negative.  Fact Sheet for Patients:   BloggerCourse.com  Fact Sheet for Healthcare Providers:  SeriousBroker.it  This test is no t yet approved or cleared by the [place]  FDA and  has been authorized for detection and/or diagnosis of SARS-CoV-2 by FDA under an Emergency Use Authorization (EUA). This EUA will remain  in effect (meaning this test can be used) for the duration of the COVID-19 declaration under Section 564(b)(1) of the Act, 21 U.S.C.section 360bbb-3(b)(1), unless the authorization is terminated  or revoked sooner.       Influenza A by PCR NEGATIVE NEGATIVE Final   Influenza B by PCR NEGATIVE NEGATIVE Final    Comment: (NOTE) The Xpert Xpress SARS-CoV-2/FLU/RSV plus assay is intended as an aid in the diagnosis of influenza from Nasopharyngeal swab specimens and should not be used as a sole basis for treatment. Nasal washings and aspirates are unacceptable for Xpert Xpress SARS-CoV-2/FLU/RSV testing.  Fact Sheet for Patients: BloggerCourse.com  Fact Sheet for Healthcare Providers: SeriousBroker.it  This test is not yet approved or cleared by the [place]  FDA and has been authorized for detection and/or diagnosis of SARS-CoV-2 by FDA under an Emergency Use Authorization (EUA). This EUA will remain in effect (meaning this test can be used) for the duration of the COVID-19 declaration under Section 564(b)(1) of the Act, 21 U.S.C. section 360bbb-3(b)(1), unless the authorization is terminated or revoked.     Resp Syncytial Virus by PCR NEGATIVE NEGATIVE Final    Comment: (NOTE) Fact Sheet for Patients: BloggerCourse.com  Fact Sheet for Healthcare Providers: SeriousBroker.it  This test is not yet approved or cleared by the [place]  FDA and has been authorized for detection and/or diagnosis of SARS-CoV-2 by FDA under an Emergency Use  Authorization (EUA). This EUA will remain in effect (meaning this test can be used) for the duration of the COVID-19 declaration under Section 564(b)(1) of the Act, 21 U.S.C. section 360bbb-3(b)(1), unless the authorization is terminated or revoked.  Performed at [facility], [address] [place], [place] [address]     Coagulation Studies: No results for input(s): LABPROT, INR in the last 72 hours.   Urinalysis: No results for input(s): COLORURINE, LABSPEC, PHURINE, GLUCOSEU, HGBUR, BILIRUBINUR, KETONESUR, PROTEINUR, UROBILINOGEN, NITRITE, LEUKOCYTESUR in the last 72 hours.  Invalid input(s): APPERANCEUR    Imaging: No results found.    Medications:     aspirin   300 mg Rectal Daily   Or   aspirin   325 mg Oral Daily   atorvastatin   40 mg Oral Daily   calcium  acetate  667 mg Oral TID WC   carvedilol   25 mg Oral BID WC   Chlorhexidine  Gluconate Cloth  6 each Topical Daily   Chlorhexidine  Gluconate Cloth  6 each Topical Q0600   cloNIDine   0.3 mg Oral TID   diltiazem   120 mg Oral Daily   doxazosin   4 mg Oral Daily   isosorbide  mononitrate  120 mg Oral Daily   losartan   100 mg Oral Daily   QUEtiapine   25 mg Oral TID   acetaminophen  **OR** acetaminophen  (TYLENOL ) oral liquid 160 mg/5 mL **OR** acetaminophen ,  haloperidol  lactate, hydrALAZINE , labetalol , LORazepam , ondansetron  (ZOFRAN ) IV, QUEtiapine , senna-docusate, sodium chloride  flush, traZODone   Assessment/ Plan:  Tracy Jennings is a 68 y.o.  female with past medical conditions including dementia, hypertension, diabetes, hyperlipidemia, cervical cancer, recurrent pancreatitis, PVD, and end-stage renal disease on hemodialysis.  Patient presents to the emergency department after experiencing slurred speech, left-sided facial spasms and weakness.  Patient has been admitted for TIA (transient ischemic attack) [G45.9] Hallucination [R44.3]  [facility] [place]/MWF/Lt AVG     End-stage renal disease on hemodialysis.  Received dialysis yesterday, UF 800 mL achieved.  Next treatment scheduled for Wednesday.  Transient ischemic attack, reporting symptoms of slurred speech, involuntary left-sided facial spasms and weakness.  Symptoms resolved.  Neurology consulted, TIA versus partial complex seizure.  Brain imaging negative for acute infarct.  Carotid ultrasound shows right-sided 50 to 60% stenosis without plaque.    3.  Hypertensive urgency, blood pressure severely elevated on admission.  Home regimen includes amlodipine , carvedilol , doxazosin , losartan , and isosorbide .  Currently receiving carvedilol , clonidine , diltiazem , doxazosin , isosorbide , and losartan .  Amlodipine  remains held.  Blood pressure elevated today, 197/76.  4. Secondary Hyperparathyroidism: with outpatient labs:   Lab Results  Component Value Date   CALCIUM  8.6 (L) 03/08/2024   PHOS 6.2 (H) 03/08/2024    Continue calcium  acetate with meals.  Will obtain updated labs with dialysis.    LOS: 5 Layla Kesling 7/1/202512:57 PM

## 2024-03-13 DIAGNOSIS — N19 Unspecified kidney failure: Secondary | ICD-10-CM | POA: Diagnosis not present

## 2024-03-13 DIAGNOSIS — N186 End stage renal disease: Secondary | ICD-10-CM | POA: Diagnosis not present

## 2024-03-13 DIAGNOSIS — G459 Transient cerebral ischemic attack, unspecified: Secondary | ICD-10-CM | POA: Diagnosis not present

## 2024-03-13 DIAGNOSIS — F05 Delirium due to known physiological condition: Secondary | ICD-10-CM | POA: Diagnosis not present

## 2024-03-13 LAB — RENAL FUNCTION PANEL
Albumin: 3.4 g/dL — ABNORMAL LOW (ref 3.5–5.0)
Anion gap: 13 (ref 5–15)
BUN: 49 mg/dL — ABNORMAL HIGH (ref 8–23)
CO2: 27 mmol/L (ref 22–32)
Calcium: 9.5 mg/dL (ref 8.9–10.3)
Chloride: 94 mmol/L — ABNORMAL LOW (ref 98–111)
Creatinine, Ser: 8.52 mg/dL — ABNORMAL HIGH (ref 0.44–1.00)
GFR, Estimated: 5 mL/min — ABNORMAL LOW (ref >60)
Glucose, Bld: 95 mg/dL (ref 70–99)
Phosphorus: 5.6 mg/dL — ABNORMAL HIGH (ref 2.5–4.6)
Potassium: 4.6 mmol/L (ref 3.5–5.1)
Sodium: 134 mmol/L — ABNORMAL LOW (ref 135–145)

## 2024-03-13 LAB — CBC
HCT: 31.7 % — ABNORMAL LOW (ref 36.0–46.0)
Hemoglobin: 10.1 g/dL — ABNORMAL LOW (ref 12.0–15.0)
MCH: 30 pg (ref 26.0–34.0)
MCHC: 31.9 g/dL (ref 30.0–36.0)
MCV: 94.1 fL (ref 80.0–100.0)
Platelets: 189 10*3/uL (ref 150–400)
RBC: 3.37 MIL/uL — ABNORMAL LOW (ref 3.87–5.11)
RDW: 14.9 % (ref 11.5–15.5)
WBC: 4.4 10*3/uL (ref 4.0–10.5)
nRBC: 0 % (ref 0.0–0.2)

## 2024-03-13 MED ORDER — SPIRONOLACTONE 25 MG PO TABS
25.0000 mg | ORAL_TABLET | Freq: Every day | ORAL | Status: DC
  Administered 2024-03-13 – 2024-04-02 (×20): 25 mg via ORAL
  Filled 2024-03-13 (×20): qty 1

## 2024-03-13 MED ORDER — LORAZEPAM 2 MG/ML IJ SOLN
0.5000 mg | Freq: Once | INTRAMUSCULAR | Status: AC
  Administered 2024-03-13: 0.5 mg via INTRAVENOUS
  Filled 2024-03-13: qty 1

## 2024-03-13 MED ORDER — SEVELAMER CARBONATE 800 MG PO TABS
1600.0000 mg | ORAL_TABLET | Freq: Three times a day (TID) | ORAL | Status: DC
  Administered 2024-03-14 – 2024-03-23 (×22): 1600 mg via ORAL
  Filled 2024-03-13 (×23): qty 2

## 2024-03-13 MED ORDER — QUETIAPINE FUMARATE 25 MG PO TABS
50.0000 mg | ORAL_TABLET | ORAL | Status: DC
  Administered 2024-03-13 – 2024-03-18 (×5): 50 mg via ORAL
  Filled 2024-03-13 (×5): qty 2

## 2024-03-13 NOTE — Progress Notes (Signed)
 [facility]  ROUNDING NOTE   Subjective:  Patient alert seen during dialysis rounding. No acute distress noted.  Hemodialysis dialysis treatment flowsheet  Blood flow rate (mL/min): 299 Arterial pressures (mmHg):-77.37 Venous pressures (mmHg): 285.24 TMP (mmHg): 18.79 Ultrafiltration rate (mL/min):864 Dialysate (mL/min): 299   Objective:  Vital signs in last 24 hours:  Temp:  [97.5 F (36.4 C)-97.6 F (36.4 C)] 97.6 F (36.4 C) (07/02 1556) Pulse Rate:  [62-68] 64 (07/02 1556) Resp:  [11-19] 14 (07/02 1556) BP: (106-185)/(39-129) 161/48 (07/02 1556) SpO2:  [94 %-100 %] 98 % (07/02 1556) Weight:  [52 kg-53 kg] 52 kg (07/02 1100)  Weight change:  Filed Weights   03/11/24 1747 03/13/24 0731 03/13/24 1100  Weight: 56.6 kg 53 kg 52 kg    Intake/Output: No intake/output data recorded.   Intake/Output this shift:  Total I/O In: -  Out: 1000 [Other:1000]  Physical Exam: General: NAD,   Head: Normocephalic, atraumatic. Moist oral mucosal membranes  Eyes: Anicteric, PERRL  Neck: Supple, trachea midline  Lungs:  Diminished to auscultation  Heart: Regular rate and rhythm  Abdomen:  Soft, nontender,   Extremities: No peripheral edema.  Neurologic: Nonfocal, moving all four extremities  Skin: No lesions  Access: Left AVF    Basic Metabolic Panel: Recent Labs  Lab 03/08/24 0802 03/13/24 0745  NA 133* 134*  K 4.4 4.6  CL 94* 94*  CO2 27 27  GLUCOSE 120* 95  BUN 42* 49*  CREATININE 7.84* 8.52*  CALCIUM  8.6* 9.5  PHOS 6.2* 5.6*    Liver Function Tests: Recent Labs  Lab 03/08/24 0802 03/13/24 0745  ALBUMIN 3.3* 3.4*   No results for input(s): LIPASE, AMYLASE in the last 168 hours. No results for input(s): AMMONIA in the last 168 hours.  CBC: Recent Labs  Lab 03/08/24 0802 03/13/24 0745  WBC 4.0 4.4  HGB 10.2* 10.1*  HCT 31.0* 31.7*  MCV 92.5 94.1  PLT 144* 189    Cardiac Enzymes: No results for input(s): CKTOTAL, CKMB,  CKMBINDEX, TROPONINI in the last 168 hours.  BNP: Invalid input(s): POCBNP  CBG: Recent Labs  Lab 03/07/24 0724 03/09/24 0808 03/10/24 0728 03/11/24 0738 03/12/24 0742  GLUCAP 112* 100* 107* 101* 118*    Microbiology: Results for orders placed or performed during the hospital encounter of 09/14/22  Resp panel by RT-PCR (RSV, Flu A&B, Covid) Anterior Nasal Swab     Status: None   Collection Time: 09/14/22  2:45 AM   Specimen: Anterior Nasal Swab  Result Value Ref Range Status   SARS Coronavirus 2 by RT PCR NEGATIVE NEGATIVE Final    Comment: (NOTE) SARS-CoV-2 target nucleic acids are NOT DETECTED.  The SARS-CoV-2 RNA is generally detectable in upper respiratory specimens during the acute phase of infection. The lowest concentration of SARS-CoV-2 viral copies this assay can detect is 138 copies/mL. A negative result does not preclude SARS-Cov-2 infection and should not be used as the sole basis for treatment or other patient management decisions. A negative result may occur with  improper specimen collection/handling, submission of specimen other than nasopharyngeal swab, presence of viral mutation(s) within the areas targeted by this assay, and inadequate number of viral copies(<138 copies/mL). A negative result must be combined with clinical observations, patient history, and epidemiological information. The expected result is Negative.  Fact Sheet for Patients:  BloggerCourse.com  Fact Sheet for Healthcare Providers:  SeriousBroker.it  This test is no t yet approved or cleared by the [place]  FDA and  has been authorized for detection and/or diagnosis of SARS-CoV-2 by FDA under an Emergency Use Authorization (EUA). This EUA will remain  in effect (meaning this test can be used) for the duration of the COVID-19 declaration under Section 564(b)(1) of the Act, 21 U.S.C.section 360bbb-3(b)(1), unless the  authorization is terminated  or revoked sooner.       Influenza A by PCR NEGATIVE NEGATIVE Final   Influenza B by PCR NEGATIVE NEGATIVE Final    Comment: (NOTE) The Xpert Xpress SARS-CoV-2/FLU/RSV plus assay is intended as an aid in the diagnosis of influenza from Nasopharyngeal swab specimens and should not be used as a sole basis for treatment. Nasal washings and aspirates are unacceptable for Xpert Xpress SARS-CoV-2/FLU/RSV testing.  Fact Sheet for Patients: BloggerCourse.com  Fact Sheet for Healthcare Providers: SeriousBroker.it  This test is not yet approved or cleared by the [place]  FDA and has been authorized for detection and/or diagnosis of SARS-CoV-2 by FDA under an Emergency Use Authorization (EUA). This EUA will remain in effect (meaning this test can be used) for the duration of the COVID-19 declaration under Section 564(b)(1) of the Act, 21 U.S.C. section 360bbb-3(b)(1), unless the authorization is terminated or revoked.     Resp Syncytial Virus by PCR NEGATIVE NEGATIVE Final    Comment: (NOTE) Fact Sheet for Patients: BloggerCourse.com  Fact Sheet for Healthcare Providers: SeriousBroker.it  This test is not yet approved or cleared by the [place]  FDA and has been authorized for detection and/or diagnosis of SARS-CoV-2 by FDA under an Emergency Use Authorization (EUA). This EUA will remain in effect (meaning this test can be used) for the duration of the COVID-19 declaration under Section 564(b)(1) of the Act, 21 U.S.C. section 360bbb-3(b)(1), unless the authorization is terminated or revoked.  Performed at [facility], [address] [place], [place] [address]     Coagulation Studies: No results for input(s): LABPROT, INR in the last 72 hours.  Urinalysis: No results for input(s): COLORURINE, LABSPEC, PHURINE,  GLUCOSEU, HGBUR, BILIRUBINUR, KETONESUR, PROTEINUR, UROBILINOGEN, NITRITE, LEUKOCYTESUR in the last 72 hours.  Invalid input(s): APPERANCEUR    Imaging: No results found.   Medications:     aspirin   300 mg Rectal Daily   Or   aspirin   325 mg Oral Daily   atorvastatin   40 mg Oral Daily   calcium  acetate  667 mg Oral TID WC   carvedilol   25 mg Oral BID WC   Chlorhexidine  Gluconate Cloth  6 each Topical Daily   Chlorhexidine  Gluconate Cloth  6 each Topical Q0600   cloNIDine   0.3 mg Oral TID   diltiazem   120 mg Oral Daily   doxazosin   4 mg Oral Daily   isosorbide  mononitrate  120 mg Oral Daily   losartan   100 mg Oral Daily   QUEtiapine   25 mg Oral TID   QUEtiapine   50 mg Oral Daily   spironolactone   25 mg Oral Daily   acetaminophen  **OR** acetaminophen  (TYLENOL ) oral liquid 160 mg/5 mL **OR** acetaminophen , haloperidol  lactate, LORazepam , ondansetron  (ZOFRAN ) IV, QUEtiapine , sodium chloride  flush, traZODone   Assessment/ Plan:  Tracy Jennings is a 68 y.o.  female with past medical conditions including dementia, hypertension, diabetes, hyperlipidemia, cervical cancer, recurrent pancreatitis, PVD, and end-stage renal disease on hemodialysis.  Patient presents to the emergency department after experiencing slurred speech, left-sided facial spasms and weakness.  Patient has been admitted for TIA (transient ischemic attack) [G45.9] Hallucination [R44.3]   [facility] [place]/MWF/Lt AVG  End-stage renal disease on hemodialysis.  Lat dialysis Monday, UF 800 mL achieved.  UF goal to 0.55mL today.   Transient ischemic attack, reporting symptoms of slurred speech, involuntary left-sided facial spasms and weakness.  Symptoms resolved.  Neurology consulted, TIA versus partial complex seizure.  Brain imaging negative for acute infarct.  Carotid ultrasound shows right-sided 50 to 60% stenosis without plaque.     3.  Hypertensive urgency, blood pressure severely  elevated on admission.  Home regimen includes amlodipine , carvedilol , doxazosin , losartan , and isosorbide .  Currently receiving carvedilol , clonidine , diltiazem , doxazosin , isosorbide , and losartan .  Amlodipine  remains held.  Blood pressure during dialysis 106/77   4. Secondary Hyperparathyroidism: with outpatient labs:    Recent Labs       Lab Results  Component Value Date    CALCIUM  8.6 (L) 03/08/2024    PHOS 6.2 (H) 03/08/2024       03/13/24 07:45  Calcium  9.5  Phosphorus 5.6 (H)  (H): Data is abnormally high Switching to sevelamere 2 tabs PO with meals.  Will monitor bone minerals this admission.   LOS: 6 Tracy Jennings 7/2/20254:19 PM

## 2024-03-13 NOTE — Plan of Care (Signed)
  Problem: Education: Goal: Knowledge of disease or condition will improve Outcome: Progressing   Problem: Education: Goal: Knowledge of secondary prevention will improve (MUST DOCUMENT ALL) Outcome: Progressing   

## 2024-03-13 NOTE — TOC Progression Note (Addendum)
 Transition of Care [facility]) - Progression Note    Patient Details  Name: Tracy Jennings MRN: [id number] Date of Birth: 03/13/1956  [facility] CM/SW Contact  Lorraine LILLETTE [place], [place] Phone Number: 03/13/2024, 3:50 PM  Clinical Narrative:     McIntosh from MD that patient medically ready for SNF. CSW reviewed notes, pt refused recent PT visit.  There are two facilities offering, CSW called sister [name] [phone], no answer sent a text message. CSW will determine which facility pt/family prefers and then initiate Auth.  TOC following.  Expected Discharge Plan: Skilled Nursing Facility Barriers to Discharge: No SNF bed  Expected Discharge Plan and Services   Discharge Planning Services: CM Consult   Living arrangements for the past 2 months: Single Family Home                                       Social Determinants of Health (SDOH) Interventions SDOH Screenings   Food Insecurity: Food Insecurity Present (03/06/2024)  Housing: Low Risk  (03/06/2024)  Transportation Needs: No Transportation Needs (03/06/2024)  Utilities: Not At Risk (03/06/2024)  Financial Resource Strain: Low Risk  (01/09/2024)   Received from [facility]  Social Connections: Unknown (03/06/2024)  Tobacco Use: Low Risk  (03/06/2024)    Readmission Risk Interventions     No data to display

## 2024-03-13 NOTE — Plan of Care (Signed)
 Pt alert to self, denies any c/o pain. Pt confused, talking to self at times. Had HD this AM and 1 Liter of fluid was removed. Sister in to visit with pt. Bed alarm on, bed low, locked and call light in reach for safety. Problem: Education: Goal: Knowledge of disease or condition will improve Outcome: Not Progressing Goal: Knowledge of secondary prevention will improve (MUST DOCUMENT ALL) Outcome: Not Progressing Goal: Knowledge of patient specific risk factors will improve (DELETE if not current risk factor) Outcome: Not Progressing   Problem: Ischemic Stroke/TIA Tissue Perfusion: Goal: Complications of ischemic stroke/TIA will be minimized Outcome: Not Progressing   Problem: Coping: Goal: Will verbalize positive feelings about self Outcome: Not Progressing Goal: Will identify appropriate support needs Outcome: Not Progressing   Problem: Health Behavior/Discharge Planning: Goal: Ability to manage health-related needs will improve Outcome: Not Progressing Goal: Goals will be collaboratively established with patient/family Outcome: Not Progressing   Problem: Self-Care: Goal: Ability to participate in self-care as condition permits will improve Outcome: Not Progressing Goal: Verbalization of feelings and concerns over difficulty with self-care will improve Outcome: Not Progressing Goal: Ability to communicate needs accurately will improve Outcome: Not Progressing   Problem: Nutrition: Goal: Risk of aspiration will decrease Outcome: Not Progressing Goal: Dietary intake will improve Outcome: Not Progressing   Problem: Education: Goal: Knowledge of General Education information will improve Description: Including pain rating scale, medication(s)/side effects and non-pharmacologic comfort measures Outcome: Not Progressing   Problem: Health Behavior/Discharge Planning: Goal: Ability to manage health-related needs will improve Outcome: Not Progressing   Problem: Clinical  Measurements: Goal: Ability to maintain clinical measurements within normal limits will improve Outcome: Not Progressing Goal: Will remain free from infection Outcome: Not Progressing Goal: Diagnostic test results will improve Outcome: Not Progressing Goal: Respiratory complications will improve Outcome: Not Progressing Goal: Cardiovascular complication will be avoided Outcome: Not Progressing   Problem: Activity: Goal: Risk for activity intolerance will decrease Outcome: Not Progressing   Problem: Nutrition: Goal: Adequate nutrition will be maintained Outcome: Not Progressing   Problem: Coping: Goal: Level of anxiety will decrease Outcome: Not Progressing   Problem: Elimination: Goal: Will not experience complications related to bowel motility Outcome: Not Progressing Goal: Will not experience complications related to urinary retention Outcome: Not Progressing   Problem: Pain Managment: Goal: General experience of comfort will improve and/or be controlled Outcome: Not Progressing   Problem: Safety: Goal: Ability to remain free from injury will improve Outcome: Not Progressing   Problem: Skin Integrity: Goal: Risk for impaired skin integrity will decrease Outcome: Not Progressing

## 2024-03-13 NOTE — Progress Notes (Signed)
 Hemodialysis Note:  Received patient in bed to unit, oriented to person Informed consent singed and in chart.  Treatment initiated: 0748 Treatment completed: 1100  Access used: Left AVF Access issues: None  Patient tolerated well. Transported back to room, alert without acute distress. Report given to patient's RN.  Total UF removed: 1 liter Medications given: None  Post HD weight: 52 kg  [facility]

## 2024-03-13 NOTE — Consult Note (Signed)
 [facility] Follow up  Patient Name: .DENECIA BRUNETTE  MRN: [id number]  DOB: 07-04-1956  Consult Order details:  Orders (From admission, onward)     Start     Ordered   03/07/24 0935  IP CONSULT TO PSYCHIATRY       Ordering Provider: Leesa Kast, DO  Provider:  (Not yet assigned)  Question Answer Comment  Location [facility]   Reason for Consult? Capacity eval to leave [facility]      03/07/24 0934             Mode of Visit: In person    Psychiatry Consult Evaluation  Service Date: March 13, 2024 LOS:  LOS: 6 days  Chief Complaint Capacity eval  Primary Psychiatric Diagnoses  Delirium due to multiple etiologies- uremia 2.   3.    Assessment  Josephina D Fiorentino is a 68 y.o. female admitted: Medicallyfor 03/05/2024  5:50 PM  with ESRD on HD MWF, HTN, HLD, DM, PVD, dementia, chronic recurrent pancreatitis, stroke, cervical cancer, HIT, who presents with slurred speech, left facial spasm, left-sided weakness.  Patient had syncopal episode on 6/23 and was seen in the ED.  At that time she was found to have O2 desaturation to 80% due to pulmonary edema on chest x-ray as well as hyperkalemia with a potassium of 6.2.  She had negative head CT during that admission.  She received hemodialysis and then was discharged home.  Her last known normal was 6/24 at 1700.  Upon arrival to the ED patient's confusion had resolved entirely.  Labs in the ED mostly unremarkable, potassium 4.6, creatinine consistent with ESRD.  Blood pressure was found to be 252/79.  Patient was placed in telemetry bed for observation.  Code stroke was called.Blood pressure resolved.  Head CT was without acute intracranial abnormality.  Chronic infarct seen on right parietal lobe and extensive chronic microvascular ischemic changes with remote lacunar infarcts in the right caudate and right thalamus appreciated.  Patient was admitted for workup of presumed TIA.  LDL 96, hemoglobin A1c  5.1%.  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  MRI brain revealed chronic encephalomalacia within the right parietal lobe and advanced diffuse cerebral white matter disease.  Patient underwent carotid Dopplers which revealed elevated velocity within the distal right internal carotid artery suggesting stenosis of 50 to 69% though no significant plaque identified and thus this may be artifactual. Patient underwent hemodialysis on 6/25 but left her session early AMA.  When she returned to the room she reported that she wanted to leave AGAINST MEDICAL ADVICE. Family expressed significant concerns about patient signing AMA and patient reportedly couldn't even sign the paper properly. Psychiatry is consulted to evaluate for capacity due to concerns of delirium.   03/13/2024; on assessment patient is noted to be tired and drowsy.  She is taking her medications but continues to have intermittent visual hallucinations and per nursing report patient is getting confused and agitated at night.  Recommend and added extra dose of Seroquel  at night.  If Seroquel  is not helping with visual hallucinations plan to switch it to Zyprexa at night.  Will continue to follow-up.  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  Seroquel  25 mg 3 times daily to help with the hallucinations due to delirium  If patient continues to have ongoing visual hallucinations with Seroquel  will consider Zyprexa at night  to see if it makes any difference.  ## Medical Decision Making Capacity: Patient's decision of leaving the hospital not completing the treatment is in the context of psychosis, visual hallucinations due to delirium.  Given the psychosis, altered mental status patient is unable to make a reasonable decision at this time.  It is our clinical opinion that patient lacks capacity as of now to make the decision of leaving AMA.  Will recommend to reach out to family members for surrogate decision making.  ## Further Work-up:  -- Patient is seen and followed by neurology  -   ## Disposition:-- There are no psychiatric contraindications to discharge at this time  ## Behavioral / Environmental: -Delirium Precautions: Delirium Interventions for Nursing and Staff: - RN to open blinds every AM. - To Bedside: Glasses, hearing aide, and pt's own shoes. Make available to patients. when possible and encourage use. - Encourage po fluids when appropriate, keep fluids within reach. - OOB to chair with meals. - Passive ROM exercises to all extremities with AM & PM care. - RN to assess orientation to person, time and place QAM and PRN. - Recommend extended visitation hours with familiar family/friends as feasible. - Staff to minimize disturbances at night. Turn off television when pt asleep or when not in use.    ## Safety and Observation Level:  - Based on my clinical evaluation, I estimate the patient to be at low risk of self harm in the current setting. - At this time, we recommend  routine. This decision is based on my review of the chart including patient's history and current presentation, interview of the patient, mental status examination, and consideration of suicide risk including evaluating suicidal ideation, plan, intent, suicidal or self-harm behaviors, risk factors, and protective factors. This judgment is based on our ability to directly address suicide risk, implement suicide prevention strategies, and  develop a safety plan while the patient is in the clinical setting. Please contact our team if there is a concern that risk level has changed.  CSSR Risk Category:C-SSRS RISK CATEGORY: No Risk  Suicide Risk Assessment: Patient has following modifiable risk factors for suicide: None identified at this time Patient has following non-modifiable or demographic risk factors for suicide: None identified at this time Patient has the following protective factors against suicide: Supportive family and Cultural, spiritual, or religious beliefs that discourage suicide  Thank you for this consult request. Recommendations have been communicated to the primary team.  We will continue to follow-up at this time.   Allyn Foil, MD       History of Present Illness  Geneieve Duell Neuhaus is a 68 y.o. female admitted: Medicallyfor 03/05/2024  5:50 PM  with ESRD on HD MWF, HTN, HLD, DM, PVD, dementia, chronic recurrent pancreatitis, stroke, cervical cancer, HIT, who presents with slurred speech, left facial spasm, left-sided weakness.  Patient had syncopal episode on 6/23 and was seen in the ED.  At that time she was found to have O2 desaturation to 80% due to pulmonary edema on chest x-ray as well as hyperkalemia with a potassium of 6.2.  She had negative head CT during that admission.  She received hemodialysis and then was discharged home.  Her last known normal was 6/24 at 1700.  Upon arrival to the ED patient's confusion had resolved entirely.  Labs in the ED mostly unremarkable, potassium 4.6, creatinine consistent with ESRD.  Blood pressure was found to be 252/79.  Patient was placed in telemetry bed for observation.  Code stroke was called.Blood pressure resolved.  Head CT was without acute intracranial abnormality.  Chronic infarct seen on right parietal lobe and extensive chronic microvascular ischemic changes with remote lacunar infarcts in the right caudate and right thalamus appreciated.  Patient was admitted  for workup of presumed TIA.  LDL 96, hemoglobin A1c 5.1%.  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  MRI brain revealed chronic encephalomalacia within the right parietal lobe and advanced diffuse cerebral white matter disease.  Patient underwent carotid Dopplers which revealed elevated velocity within the distal right internal carotid artery suggesting stenosis of 50 to 69% though no significant plaque identified and thus this may be artifactual. Patient underwent hemodialysis on 6/25 but left her session early AMA.  When she returned to the room she reported that she wanted to leave AGAINST MEDICAL ADVICE. Family expressed significant concerns about patient signing AMA and patient reportedly couldn't even sign the paper properly. Psychiatry is consulted to evaluate for capacity due to concerns of delirium.  03/13/24: Patient is noted to be getting dialysis today morning.  Patient is seen in the dialysis room.  She was able to answer some questions.  She is noted to be tired and drowsy.  She is not endorsing SI/HI and denies seeing any children in the room.  Per nursing report patient is getting confused and agitated at nights and continues to display visual hallucinations at times.  Psychiatric and Social History  Psychiatric History:  Information collected from patient  Prev Dx/Sx: denies Current Psych Provider: none reported Home Meds (current): none reported Previous Med Trials: denies Therapy: denies  Prior Psych Hospitalization: denies  Prior Self Harm: denies Prior Violence: denies  Family Psych History: denies Family Hx suicide: denies  Social History:   Educational Hx: HS Occupational Hx: disability Legal Hx: denies Living Situation: lives by herself but has family support Spiritual Hx: denies Access to weapons/lethal means: denies   Substance History Alcohol: denies  Tobacco: denies Illicit drugs: denies Prescription drug abuse: denies Rehab  hx: denies  Exam Findings  Physical Exam: Reviewed and agree with the physical exam findings Vital Signs:  Temp:  [97.5 F (36.4 C)-97.6 F (36.4 C)] 97.6 F (36.4 C) (07/02 1556) Pulse Rate:  [62-68] 65 (07/02 1720) Resp:  [11-19] 14 (07/02 1556) BP: (106-189)/(39-129) 189/62 (07/02 1720) SpO2:  [94 %-100 %] 98 % (07/02 1556) Weight:  [52 kg-53 kg] 52 kg (07/02 1100) Blood pressure (!) 189/62, pulse 65, temperature 97.6 F (36.4 C), resp. rate 14, height 5' 3 (1.6 m), weight 52 kg, SpO2 98%. Body mass index is 20.31 kg/m.    Mental Status Exam: General Appearance: Casual  Orientation:  Other:  to self,month, year  Memory:  Immediate;   Fair Recent;   Poor Remote;   Poor  Concentration:  Concentration: Poor and Attention Span: Poor  Recall:  Fair  Attention  Poor  Eye Contact:  Minimal  Speech:  Normal Rate  Language:  Fair  Volume:  Normal  Mood: fine  Affect:  Appropriate  Thought Process:  Disorganized  Thought Content:  coherent  Suicidal Thoughts:  No  Homicidal Thoughts:  No  Judgement:  Impaired  Insight:  Shallow  Psychomotor Activity:  Normal  Akathisia:  No  Fund of Knowledge:  Fair      Assets:  Communication Skills Desire for Improvement Housing Resilience  Cognition:  Impaired,  Mild  ADL's:  Intact  AIMS (if indicated):        Other History   These have been pulled in through the EMR, reviewed, and updated if appropriate.  Family History:  The patient's family history includes Cancer in her father; Diabetes in her maternal grandfather, maternal grandmother, paternal grandfather, paternal grandmother, and sister; Gout in her mother; Hypertension in her mother; Stroke in her mother.  Medical History: Past Medical History:  Diagnosis Date   Anemia 04/2018   low iron. to be started on supplements   Cervical cancer (HCC)    CKD (chronic kidney disease)    Stage IV   Complication of anesthesia    receceived too much anesthesia, that she  was in coma for a couple days    COVID-19 virus detected 10/28/2019   Diabetes mellitus without complication (HCC)    type II   ESRD (end stage renal disease) (HCC)    Heart murmur    followed as a child only   HSIL (high grade squamous intraepithelial lesion) on Pap smear of cervix    Hyperlipidemia associated with type 2 diabetes mellitus (HCC)    Hypertension    Pancreatitis    Peripheral vascular disease (HCC)     Surgical History: Past Surgical History:  Procedure Laterality Date   AMPUTATION TOE Left 2013   2nd toe. tip of toe (toe nail was infected)   AV FISTULA PLACEMENT Left 05/11/2018   Procedure: ARTERIOVENOUS (AV) FISTULA CREATION;  Surgeon: Jama Cordella MATSU, MD;  Location: [facility];  Service: Vascular;  Laterality: Left;   CATARACT EXTRACTION     CERVICAL CONIZATION W/BX N/A 04/08/2020   Procedure: CONIZATION CERVIX WITH BIOPSY;  Surgeon: Mancil Barter, MD;  Location: [facility];  Service: Gynecology;  Laterality: N/A;   CHOLECYSTECTOMY  2014   COLONOSCOPY     COLONOSCOPY WITH PROPOFOL  N/A 01/10/2018   Procedure: COLONOSCOPY WITH PROPOFOL ;  Surgeon: Toledo, Ladell POUR, MD;  Location: [facility];  Service: Gastroenterology;  Laterality: N/A;   DIALYSIS/PERMA CATHETER INSERTION N/A 05/21/2018   Procedure: DIALYSIS/PERMA CATHETER INSERTION;  Surgeon: Marea Selinda RAMAN, MD;  Location: [facility];  Service: Cardiovascular;  Laterality: N/A;   DIALYSIS/PERMA CATHETER INSERTION N/A 07/14/2020   Procedure: DIALYSIS/PERMA CATHETER INSERTION;  Surgeon: Marea Selinda RAMAN, MD;  Location: [facility];  Service: Cardiovascular;  Laterality: N/A;   DIALYSIS/PERMA CATHETER REMOVAL N/A 04/11/2019   Procedure: DIALYSIS/PERMA CATHETER REMOVAL;  Surgeon: Marea Selinda RAMAN, MD;  Location: [facility];  Service: Cardiovascular;  Laterality: N/A;   EYE SURGERY Bilateral 2018   cataract extractions   IR IMAGING GUIDED PORT INSERTION  06/05/2020   THROMBECTOMY W/ EMBOLECTOMY   05/11/2018   Procedure: THROMBECTOMY ARTERIOVENOUS FISTULA;  Surgeon: Jama Cordella MATSU, MD;  Location: [facility];  Service: Vascular;;   TUBAL LIGATION  1984     Medications:   Current Facility-Administered Medications:    acetaminophen  (TYLENOL ) tablet 650 mg, 650 mg, Oral, Q4H PRN, 650 mg at 03/09/24 2159 **OR** acetaminophen  (TYLENOL ) 160 MG/5ML solution 650 mg, 650 mg, Per Tube, Q4H PRN **OR** acetaminophen  (TYLENOL ) suppository 650 mg, 650 mg, Rectal, Q4H PRN, Niu, Xilin, MD  aspirin  suppository 300 mg, 300 mg, Rectal, Daily **OR** aspirin  tablet 325 mg, 325 mg, Oral, Daily, Niu, Xilin, MD, 325 mg at 03/13/24 1203   atorvastatin  (LIPITOR) tablet 40 mg, 40 mg, Oral, Daily, Niu, Xilin, MD, 40 mg at 03/13/24 1203   carvedilol  (COREG ) tablet 25 mg, 25 mg, Oral, BID WC, Breeze, Shantelle, NP, 25 mg at 03/13/24 1720   Chlorhexidine  Gluconate Cloth 2 % PADS 6 each, 6 each, Topical, Daily, Dezii, Alexandra, DO, 6 each at 03/13/24 1204   Chlorhexidine  Gluconate Cloth 2 % PADS 6 each, 6 each, Topical, Q0600, Druscilla Bald, NP, 6 each at 03/13/24 0607   cloNIDine  (CATAPRES ) tablet 0.3 mg, 0.3 mg, Oral, TID, Dezii, Alexandra, DO, 0.3 mg at 03/13/24 1720   diltiazem  (CARDIZEM  CD) 24 hr capsule 120 mg, 120 mg, Oral, Daily, Breeze, Shantelle, NP, 120 mg at 03/13/24 1202   doxazosin  (CARDURA ) tablet 4 mg, 4 mg, Oral, Daily, Breeze, Shantelle, NP, 4 mg at 03/13/24 1202   haloperidol  lactate (HALDOL ) injection 1-2 mg, 1-2 mg, Intramuscular, Q6H PRN, Mansy, Jan A, MD, 2 mg at 03/12/24 1755   isosorbide  mononitrate (IMDUR ) 24 hr tablet 120 mg, 120 mg, Oral, Daily, Dezii, Alexandra, DO, 120 mg at 03/13/24 1203   LORazepam  (ATIVAN ) injection 2 mg, 2 mg, Intravenous, Q2H PRN, Niu, Xilin, MD   losartan  (COZAAR ) tablet 100 mg, 100 mg, Oral, Daily, Breeze, Shantelle, NP, 100 mg at 03/13/24 1720   ondansetron  (ZOFRAN ) injection 4 mg, 4 mg, Intravenous, Q8H PRN, Niu, Xilin, MD, 4 mg at 03/06/24 9189    QUEtiapine  (SEROQUEL ) tablet 25 mg, 25 mg, Oral, TID, Taeya Theall, MD, 25 mg at 03/13/24 1720   QUEtiapine  (SEROQUEL ) tablet 25 mg, 25 mg, Oral, Q8H PRN, Vaunda Gutterman, MD   QUEtiapine  (SEROQUEL ) tablet 50 mg, 50 mg, Oral, Daily, Terriah Reggio, MD, 50 mg at 03/13/24 2013   [START ON 03/14/2024] sevelamer carbonate (RENVELA) tablet 1,600 mg, 1,600 mg, Oral, TID WC, Levorn, [place] P, NP   sodium chloride  flush (NS) 0.9 % injection 10-40 mL, 10-40 mL, Intracatheter, PRN, Dezii, Alexandra, DO, 10 mL at 03/10/24 0830   spironolactone  (ALDACTONE ) tablet 25 mg, 25 mg, Oral, Daily, Lenon Pons L, MD, 25 mg at 03/13/24 1204   traZODone  (DESYREL ) tablet 25 mg, 25 mg, Oral, QHS PRN, Mansy, Jan A, MD, 25 mg at 03/12/24 2213  Allergies: Allergies  Allergen Reactions   Amlodipine  Swelling    Knees down to ankles Knees down to ankles   Hctz [Hydrochlorothiazide]     pancreatitis   Heparin  Other (See Comments)    Hx  HIT,   Pt reports cardiac arrest when given heparin    Lmw Heparin      Reports cardiac arrest when given heparin    Other Other (See Comments)    Reports cardiac arrest when given during surgery Reports cardiac arrest when given during surgery Reports cardiac arrest when given during surgery    Sulfa Antibiotics Rash   Lactose    Dairy Aid [Tilactase]     Runny nose   Hydralazine      Nausea and rash    Arrayah Connors, MD

## 2024-03-13 NOTE — Progress Notes (Signed)
 PT Cancellation Note  Patient Details Name: DANAYE SOBH MRN: [id number] DOB: [birth year]   Cancelled Treatment:    Reason Eval/Treat Not Completed: Patient declined, no reason specified Despite encouragement to assist patient with re-positioning, or sitting up to eat lunch, patient declines assistance and declines mobility at this time. RN aware. Will continue to attempt as able.    Emillie Chasen 03/13/2024, 3:32 PM

## 2024-03-13 NOTE — Progress Notes (Signed)
 PROGRESS NOTE Tracy Jennings  FMW:[id number] DOB: [birth year] DOA: 03/05/2024 PCP: Zachary Idelia LABOR, MD   Hospital Course:  Tracy Jennings is a 68 year old female with ESRD on HD MWF, HTN, HLD, DM, PVD, dementia, chronic recurrent pancreatitis, stroke, cervical cancer, HIT, who presents with slurred speech, left facial spasm, left-sided weakness.  Patient had syncopal episode on 6/23 and was seen in the ED.  At that time she was found to have O2 desaturation to 80% due to pulmonary edema on chest x-ray as well as hyperkalemia with a potassium of 6.2.  She had negative head CT during that admission.  She received hemodialysis and then was discharged home.  Her last known normal was 6/24 at 1700.  Upon arrival to the ED patient's confusion had resolved entirely.  Labs in the ED mostly unremarkable, potassium 4.6, creatinine consistent with ESRD.  Blood pressure was found to be 252/79.  Patient was placed in telemetry bed for observation.  Code stroke was called.Blood pressure resolved.  Head CT was without acute intracranial abnormality.  Chronic infarct seen on right parietal lobe and extensive chronic microvascular ischemic changes with remote lacunar infarcts in the right caudate and right thalamus appreciated.  Patient was admitted for workup of presumed TIA.  LDL 96, hemoglobin A1c 5.1%.  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  MRI brain revealed chronic encephalomalacia within the right parietal lobe and advanced diffuse cerebral white matter disease.  Patient underwent carotid Dopplers which revealed elevated velocity within the distal right internal carotid artery suggesting stenosis of 50 to 69% though no significant plaque identified and thus this may be artifactual. Patient underwent hemodialysis on 6/25 but left her session early AMA.she attempted to leave the hospital AMA but during this event began hallucinating and loss capacity.  Psychiatry was consulted and met  with the patient on 6/26 and confirms acute delirium, currently the patient does not have capacity to make medical decisions.  She has been started on Seroquel  for presumed Lewy body dementia.  She has had persistent agitation and Seroquel  has been slowly increased. She is currently medically stable and undergoing routine HD while awaiting safe dispo. TOC is consulted.   Subjective: Patient states that she is feeling well and has no concerns. She does not remember going to HD today.   Objective: Vitals:   03/12/24 2122 03/13/24 0731 03/13/24 0748 03/13/24 0800  BP: (!) 155/45 (!) 185/73 (!) 129/96 (!) 165/48  Pulse: 64 68 66 66  Resp:  16 15 13   Temp:  (!) 97.5 F (36.4 C)    TempSrc:  Axillary    SpO2:  98% 95% 96%  Weight:  53 kg    Height:       Filed Weights   03/11/24 1405 03/11/24 1747 03/13/24 0731  Weight: 57.4 kg 56.6 kg 53 kg   Examination: General exam: Appears calm and comfortable, NAD  Respiratory system: Normal work of breathing, symmetric chest wall expansion Cardiovascular system: S1 & S2 heard, RRR.  Gastrointestinal system: Abdomen is nondistended, soft and nontender.  Neuro: alert and oriented to self, location Extremities: Symmetric, expected ROM Skin: No rashes, lesions Psychiatry: normal mood, then mildly agitated when confronted with the fact that she went to HD today and doesn't remember going.  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 No outpatient work up.  Presumed Lewy body dementia at this time - Mentation and capacity continue to change regularly -  Psychiatry has been consulted - Patient is currently on Seroquel - increased last on 6/29. Has been adherent recently.  - As needed Haldol .  - Please see psychiatry notes for more details, appreciate further psychiatry recommendations.- Presently patient does not have capacity to make medical decisions or leave AMA.  We have significant concerns about her ability to take care of herself at home.  She will likely require placement for safety  Hypertensive urgency - Systolic blood pressure severely elevated above 200. - Resume all home meds (patient listed on amlodipine  but also has allergy.  On alternative CCB.) - Continue daily blood pressure titration.  Much improved when she takes all medications.  Medication adherence currently limited by her underlying dementia. - poorly controlled on current regimen- added spironolactone  as all medications are currently at max recommended dose  TIA rule out- MRA: Moderate stenosis of proximal basilar artery at 50% - MRI brain: Chronic encephalomalacia with the right parietal lobe and advanced diffuse cerebral white matter disease - Carotid Dopplers: Elevated velocity within the distal right internal carotid artery suggesting stenosis of 50 to 69% though no significant plaque identified and thus may be artifactual. - EEG: Cortical dysfunction arising from right temporal parietal region secondary to underlying structural abnormality.  No seizures or epileptiform discharges. - Echocardiogram: EF 55 to 60%, no regional wall motion abnormalities, severe left ventricular hypertrophy, grade 2 diastolic dysfunction elevated pulmonary arterial systolic pressures.  Calcification of aortic valve - Continue statin, continue aspirin . - No events on telemetry.  Have discontinued further monitoring - Lipids and A1c at goal. - Neurology consulted.  Believes patient may have Lewy body dementia as above  Pulmonary arterial hypertension Grade 2 diastolic dysfunction - As  seen on echocardiogram - Continue GDMT - Tight blood pressure control as able - Remains clinically euvolemic. Volume to be removed via dialysis if needed  ESRD Electrolyte disturbance - On HD MWF - Nephrology consulted during this admission, continue with dialysis per renal  Elevated troponin - No chest pain, no shortness of breath.  Troponin 41 -> 43. likely secondary to demand ischemia and ESRD - Continue aspirin  and Lipitor  Type 2 diabetes with renal manifestations - Recent hemoglobin A1c 5.1%, well-controlled.  Not currently taking medications - Daily CBG, currently at goal  Anemia of chronic disease, ESRD - Trend CBC. - Hemoglobin currently stable  Hyperlipidemia - Statin  Heparin -induced thrombocytopenia - Avoid heparin  or Lovenox  products  - SCDs as needed  DVT prophylaxis: SCDs   Code Status: Full Code Disposition:  Inpatient, needs placement. TOC consulted to arrange. Awaiting updates Consultants:  nephrology Psychiatry   Procedures:  HD  Data Reviewed: I have personally reviewed following labs and imaging studies CBC: Recent Labs  Lab 03/08/24 0802 03/13/24 0745  WBC 4.0 4.4  HGB 10.2* 10.1*  HCT 31.0* 31.7*  MCV 92.5 94.1  PLT 144* 189   Basic Metabolic Panel: Recent Labs  Lab 03/08/24 0802  NA 133*  K 4.4  CL 94*  CO2 27  GLUCOSE 120*  BUN 42*  CREATININE 7.84*  CALCIUM  8.6*  PHOS 6.2*   GFR: Estimated Creatinine Clearance: 5.8 mL/min (A) (by C-G formula based  on SCr of 7.84 mg/dL (H)).   LOS: 6 days    Marien LITTIE Piety, DO [facility]  To contact the attending physician between 7A-7P please use Epic Chat. To contact the covering physician during after hours 7P-7A, please review Amion.  03/13/2024, 8:26 AM

## 2024-03-13 NOTE — TOC Progression Note (Incomplete Revision)
 Transition of Care [facility]) - Progression Note    Patient Details  Name: Tracy Jennings MRN: [id number] Date of Birth: 08/25/1956  [facility] CM/SW Contact  Lorraine LILLETTE [place], [place] Phone Number: 03/13/2024, 3:50 PM  Clinical Narrative:     [place] from MD that patient medically ready for SNF. CSW reviewed notes, pt refused recent PT visit.  There are two facilities offering, CSW called sister [name] [phone], no answer sent a text message. CSW will determine which facility pt/family prefers and then initiate Auth.  TOC following.  Addendum: There   Expected Discharge Plan: Skilled Nursing Facility Barriers to Discharge: No SNF bed  Expected Discharge Plan and Services   Discharge Planning Services: CM Consult   Living arrangements for the past 2 months: Single Family Home                                       Social Determinants of Health (SDOH) Interventions SDOH Screenings   Food Insecurity: Food Insecurity Present (03/06/2024)  Housing: Low Risk  (03/06/2024)  Transportation Needs: No Transportation Needs (03/06/2024)  Utilities: Not At Risk (03/06/2024)  Financial Resource Strain: Low Risk  (01/09/2024)   Received from [facility]  Social Connections: Unknown (03/06/2024)  Tobacco Use: Low Risk  (03/06/2024)    Readmission Risk Interventions     No data to display

## 2024-03-14 DIAGNOSIS — G459 Transient cerebral ischemic attack, unspecified: Secondary | ICD-10-CM | POA: Diagnosis not present

## 2024-03-14 DIAGNOSIS — N186 End stage renal disease: Secondary | ICD-10-CM | POA: Diagnosis not present

## 2024-03-14 DIAGNOSIS — F05 Delirium due to known physiological condition: Secondary | ICD-10-CM | POA: Diagnosis not present

## 2024-03-14 DIAGNOSIS — N19 Unspecified kidney failure: Secondary | ICD-10-CM | POA: Diagnosis not present

## 2024-03-14 LAB — GLUCOSE, CAPILLARY
Glucose-Capillary: 76 mg/dL (ref 70–99)
Glucose-Capillary: 102 mg/dL — ABNORMAL HIGH (ref 70–99)
Glucose-Capillary: 130 mg/dL — ABNORMAL HIGH (ref 70–99)

## 2024-03-14 MED ORDER — ANTICOAGULANT SODIUM CITRATE 4% (200MG/5ML) IV SOLN
5.0000 mL | Status: AC | PRN
  Administered 2024-03-14: 5 mL
  Filled 2024-03-14: qty 5

## 2024-03-14 NOTE — Plan of Care (Signed)
  Problem: Clinical Measurements: Goal: Ability to maintain clinical measurements within normal limits will improve Outcome: Progressing   Problem: Pain Managment: Goal: General experience of comfort will improve and/or be controlled Outcome: Progressing   Problem: Safety: Goal: Ability to remain free from injury will improve Outcome: Progressing   Problem: Skin Integrity: Goal: Risk for impaired skin integrity will decrease Outcome: Progressing

## 2024-03-14 NOTE — Consult Note (Signed)
 Tracy Jennings  Patient Name: .Tracy Jennings  MRN: [id number]  DOB: 1956-06-17  Consult Order details:  Orders (From admission, onward)     Start     Ordered   03/07/24 0935  IP CONSULT TO PSYCHIATRY       Ordering Provider: Leesa Kast, DO  Provider:  (Not yet assigned)  Question Answer Comment  Location [facility]   Reason for Consult? Capacity eval to leave [facility]      03/07/24 0934             Mode of Visit: In person    Psychiatry Consult Evaluation  Service Date: March 14, 2024 LOS:  LOS: 7 days  Chief Complaint Capacity eval  Primary Psychiatric Diagnoses  Delirium due to multiple etiologies- uremia 2.   3.    Assessment  Tracy Jennings is a 68 y.o. female admitted: Medicallyfor 03/05/2024  5:50 PM  with ESRD on HD MWF, HTN, HLD, DM, PVD, dementia, chronic recurrent pancreatitis, stroke, cervical cancer, HIT, who presents with slurred speech, left facial spasm, left-sided weakness.  Patient had syncopal episode on 6/23 and was seen in the ED.  At that time she was found to have O2 desaturation to 80% due to pulmonary edema on chest x-ray as well as hyperkalemia with a potassium of 6.2.  She had negative head CT during that admission.  She received hemodialysis and then was discharged home.  Her last known normal was 6/24 at 1700.  Upon arrival to the ED patient's confusion had resolved entirely.  Labs in the ED mostly unremarkable, potassium 4.6, creatinine consistent with ESRD.  Blood pressure was found to be 252/79.  Patient was placed in telemetry bed for observation.  Code stroke was called.Blood pressure resolved.  Head CT was without acute intracranial abnormality.  Chronic infarct seen on right parietal lobe and extensive chronic microvascular ischemic changes with remote lacunar infarcts in the right caudate and right thalamus appreciated.  Patient was admitted for workup of presumed TIA.  LDL 96, hemoglobin A1c  5.1%.  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  MRI brain revealed chronic encephalomalacia within the right parietal lobe and advanced diffuse cerebral white matter disease.  Patient underwent carotid Dopplers which revealed elevated velocity within the distal right internal carotid artery suggesting stenosis of 50 to 69% though no significant plaque identified and thus this may be artifactual. Patient underwent hemodialysis on 6/25 but left her session early AMA.  When she returned to the room she reported that she wanted to leave AGAINST MEDICAL ADVICE. Family expressed significant concerns about patient signing AMA and patient reportedly couldn't even sign the paper properly. Psychiatry is consulted to evaluate for capacity due to concerns of delirium.   03/14/24: On assessment patient is noted to be responding well to the medication at least during the daytime.  She denies hallucinations and is not responding to internal stimuli.  She denies any safety concerns including SI/HI.  Will continue to monitor on the current medication regimen.  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  Seroquel  25 mg 3 times daily to help with the hallucinations due to delirium  If patient continues to have ongoing visual hallucinations with Seroquel  will consider Zyprexa at night to see if it makes any difference.  ## Medical Decision Making Capacity: Patient's decision of leaving the  hospital not completing the treatment is in the context of psychosis, visual hallucinations due to delirium.  Given the psychosis, altered mental status patient is unable to make a reasonable decision at this time.  It is our clinical opinion that patient lacks capacity as of now to make the decision of leaving AMA.  Will recommend to reach out to family members for surrogate decision making.  ## Further Work-Jennings:  -- Patient is seen and followed by neurology  -   ## Disposition:-- There are no psychiatric contraindications to discharge at this time  ## Behavioral / Environmental: -Delirium Precautions: Delirium Interventions for Nursing and Staff: - RN to open blinds every AM. - To Bedside: Glasses, hearing aide, and pt's own shoes. Make available to patients. when possible and encourage use. - Encourage po fluids when appropriate, keep fluids within reach. - OOB to chair with meals. - Passive ROM exercises to all extremities with AM & PM care. - RN to assess orientation to person, time and place QAM and PRN. - Recommend extended visitation hours with familiar family/friends as feasible. - Staff to minimize disturbances at night. Turn off television when pt asleep or when not in use.    ## Safety and Observation Level:  - Based on my clinical evaluation, I estimate the patient to be at low risk of self harm in the current setting. - At this time, we recommend  routine. This decision is based on my review of the chart including patient's history and current presentation, interview of the patient, mental status examination, and consideration of suicide risk including evaluating suicidal ideation, plan, intent, suicidal or self-harm behaviors, risk factors, and protective factors. This judgment is based on our ability to directly address suicide risk, implement suicide prevention strategies, and develop a safety plan while the patient is in the clinical setting. Please contact our team if there is a  concern that risk level has changed.  CSSR Risk Category:C-SSRS RISK CATEGORY: No Risk  Suicide Risk Assessment: Patient has following modifiable risk factors for suicide: None identified at this time Patient has following non-modifiable or demographic risk factors for suicide: None identified at this time Patient has the following protective factors against suicide: Supportive family and Cultural, spiritual, or religious beliefs that discourage suicide  Thank you for this consult request. Recommendations have been communicated to the primary team.  We will continue to follow-Jennings at this time.   Allyn Foil, MD       History of Present Illness  Tracy Jennings is a 68 y.o. female admitted: Medicallyfor 03/05/2024  5:50 PM  with ESRD on HD MWF, HTN, HLD, DM, PVD, dementia, chronic recurrent pancreatitis, stroke, cervical cancer, HIT, who presents with slurred speech, left facial spasm, left-sided weakness.  Patient had syncopal episode on 6/23 and was seen in the ED.  At that time she was found to have O2 desaturation to 80% due to pulmonary edema on chest x-ray as well as hyperkalemia with a potassium of 6.2.  She had negative head CT during that admission.  She received hemodialysis and then was discharged home.  Her last known  normal was 6/24 at 1700.  Upon arrival to the ED patient's confusion had resolved entirely.  Labs in the ED mostly unremarkable, potassium 4.6, creatinine consistent with ESRD.  Blood pressure was found to be 252/79.  Patient was placed in telemetry bed for observation.  Code stroke was called.Blood pressure resolved.  Head CT was without acute intracranial abnormality.  Chronic infarct seen on right parietal lobe and extensive chronic microvascular ischemic changes with remote lacunar infarcts in the right caudate and right thalamus appreciated.  Patient was admitted for workup of presumed TIA.  LDL 96, hemoglobin A1c 5.1%.  MRA head revealed moderate stenosis of proximal  basilar artery of 50%.  MRI brain revealed chronic encephalomalacia within the right parietal lobe and advanced diffuse cerebral white matter disease.  Patient underwent carotid Dopplers which revealed elevated velocity within the distal right internal carotid artery suggesting stenosis of 50 to 69% though no significant plaque identified and thus this may be artifactual. Patient underwent hemodialysis on 6/25 but left her session early AMA.  When she returned to the room she reported that she wanted to leave AGAINST MEDICAL ADVICE. Family expressed significant concerns about patient signing AMA and patient reportedly couldn't even sign the paper properly. Psychiatry is consulted to evaluate for capacity due to concerns of delirium. 03/14/24: Patient is noted to be resting in bed.  She is very drowsy but is responding to verbal commands.  She is able to answer the questions related to orientation and was able to identify that she is at [facility].  She was able to acknowledge that she got hemodialysis yesterday.  She denies feeling depressed or anxious.  She denies hearing any children's voice or seeing any children running around or bothering her.  She denies any auditory or visual hallucinations at this time.  She is noted to be dozing off.  She denies SI/HI/plan.  She remains discharge focused.  Psychiatric and Social History  Psychiatric History:  Information collected from patient  Prev Dx/Sx: denies Current Psych Provider: none reported Home Meds (current): none reported Previous Med Trials: denies Therapy: denies  Prior Psych Hospitalization: denies  Prior Self Harm: denies Prior Violence: denies  Family Psych History: denies Family Hx suicide: denies  Social History:   Educational Hx: HS Occupational Hx: disability Legal Hx: denies Living Situation: lives by herself but has family support Spiritual Hx: denies Access to weapons/lethal means: denies   Substance  History Alcohol: denies  Tobacco: denies Illicit drugs: denies Prescription drug abuse: denies Rehab hx: denies  Exam Findings  Physical Exam: Reviewed and agree with the physical exam findings Vital Signs:  Temp:  [97.6 F (36.4 C)-98.7 F (37.1 C)] 98.7 F (37.1 C) (07/03 0800) Pulse Rate:  [63-69] 63 (07/03 1520) Resp:  [14-16] 16 (07/03 1520) BP: (133-192)/(48-62) 133/52 (07/03 1520) SpO2:  [94 %-98 %] 94 % (07/03 0800) Blood pressure (!) 133/52, pulse 63, temperature 98.7 F (37.1 C), temperature source Oral, resp. rate 16, height 5' 3 (1.6 m), weight 52 kg, SpO2 94%. Body mass index is 20.31 kg/m.    Mental Status Exam: General Appearance: Casual  Orientation:  Other:  to self,month, year  Memory:  Immediate;   Fair Recent;   Poor Remote;   Poor  Concentration:  Concentration: Poor and Attention Span: Poor  Recall:  Fair  Attention  Poor  Eye Contact:  Minimal  Speech:  Normal Rate  Language:  Fair  Volume:  Normal  Mood: fine  Affect:  Appropriate  Thought Process:  Disorganized  Thought Content:  coherent  Suicidal Thoughts:  No  Homicidal Thoughts:  No  Judgement:  Impaired  Insight:  Shallow  Psychomotor Activity:  Normal  Akathisia:  No  Fund of Knowledge:  Fair      Assets:  Communication Skills Desire for Improvement Housing Resilience  Cognition:  Impaired,  Mild  ADL's:  impaired  AIMS (if indicated):        Other History   These have been pulled in through the EMR, reviewed, and updated if appropriate.  Family History:  The patient's family history includes Cancer in her father; Diabetes in her maternal grandfather, maternal grandmother, paternal grandfather, paternal grandmother, and sister; Gout in her mother; Hypertension in her mother; Stroke in her mother.  Medical History: Past Medical History:  Diagnosis Date   Anemia 04/2018   low iron. to be started on supplements   Cervical cancer (HCC)    CKD (chronic kidney disease)     Stage IV   Complication of anesthesia    receceived too much anesthesia, that she was in coma for a couple days    COVID-19 virus detected 10/28/2019   Diabetes mellitus without complication (HCC)    type II   ESRD (end stage renal disease) (HCC)    Heart murmur    followed as a child only   HSIL (high grade squamous intraepithelial lesion) on Pap smear of cervix    Hyperlipidemia associated with type 2 diabetes mellitus (HCC)    Hypertension    Pancreatitis    Peripheral vascular disease (HCC)     Surgical History: Past Surgical History:  Procedure Laterality Date   AMPUTATION TOE Left 2013   2nd toe. tip of toe (toe nail was infected)   AV FISTULA PLACEMENT Left 05/11/2018   Procedure: ARTERIOVENOUS (AV) FISTULA CREATION;  Surgeon: Jama Cordella MATSU, MD;  Location: [facility];  Service: Vascular;  Laterality: Left;   CATARACT EXTRACTION     CERVICAL CONIZATION W/BX N/A 04/08/2020   Procedure: CONIZATION CERVIX WITH BIOPSY;  Surgeon: Mancil Barter, MD;  Location: [facility];  Service: Gynecology;  Laterality: N/A;   CHOLECYSTECTOMY  2014   COLONOSCOPY     COLONOSCOPY WITH PROPOFOL  N/A 01/10/2018   Procedure: COLONOSCOPY WITH PROPOFOL ;  Surgeon: Toledo, Ladell POUR, MD;  Location: [facility];  Service: Gastroenterology;  Laterality: N/A;   DIALYSIS/PERMA CATHETER INSERTION N/A 05/21/2018   Procedure: DIALYSIS/PERMA CATHETER INSERTION;  Surgeon: Marea Selinda RAMAN, MD;  Location: [facility];  Service: Cardiovascular;  Laterality: N/A;   DIALYSIS/PERMA CATHETER INSERTION N/A 07/14/2020   Procedure: DIALYSIS/PERMA CATHETER INSERTION;  Surgeon: Marea Selinda RAMAN, MD;  Location: [facility];  Service: Cardiovascular;  Laterality: N/A;   DIALYSIS/PERMA CATHETER REMOVAL N/A 04/11/2019   Procedure: DIALYSIS/PERMA CATHETER REMOVAL;  Surgeon: Marea Selinda RAMAN, MD;  Location: [facility];  Service: Cardiovascular;  Laterality: N/A;   EYE SURGERY Bilateral 2018   cataract  extractions   IR IMAGING GUIDED PORT INSERTION  06/05/2020   THROMBECTOMY W/ EMBOLECTOMY  05/11/2018   Procedure: THROMBECTOMY ARTERIOVENOUS FISTULA;  Surgeon: Jama Cordella MATSU, MD;  Location: [facility];  Service: Vascular;;   TUBAL LIGATION  1984     Medications:   Current Facility-Administered Medications:    acetaminophen  (TYLENOL ) tablet 650 mg, 650 mg, Oral, Q4H PRN, 650 mg at 03/09/24 2159 **OR** acetaminophen  (TYLENOL ) 160 MG/5ML solution 650 mg, 650 mg, Per Tube, Q4H PRN **OR** acetaminophen  (TYLENOL ) suppository 650 mg, 650  mg, Rectal, Q4H PRN, Niu, Xilin, MD   aspirin  suppository 300 mg, 300 mg, Rectal, Daily **OR** aspirin  tablet 325 mg, 325 mg, Oral, Daily, Niu, Xilin, MD, 325 mg at 03/14/24 9073   atorvastatin  (LIPITOR) tablet 40 mg, 40 mg, Oral, Daily, Niu, Xilin, MD, 40 mg at 03/14/24 0925   carvedilol  (COREG ) tablet 25 mg, 25 mg, Oral, BID WC, Breeze, Shantelle, NP, 25 mg at 03/14/24 9074   Chlorhexidine  Gluconate Cloth 2 % PADS 6 each, 6 each, Topical, Daily, Dezii, Alexandra, DO, 6 each at 03/14/24 1231   cloNIDine  (CATAPRES ) tablet 0.3 mg, 0.3 mg, Oral, TID, Dezii, Alexandra, DO, 0.3 mg at 03/14/24 9074   diltiazem  (CARDIZEM  CD) 24 hr capsule 120 mg, 120 mg, Oral, Daily, Breeze, Shantelle, NP, 120 mg at 03/14/24 9073   doxazosin  (CARDURA ) tablet 4 mg, 4 mg, Oral, Daily, Breeze, Shantelle, NP, 4 mg at 03/14/24 9074   haloperidol  lactate (HALDOL ) injection 1-2 mg, 1-2 mg, Intramuscular, Q6H PRN, Mansy, Jan A, MD, 2 mg at 03/12/24 1755   isosorbide  mononitrate (IMDUR ) 24 hr tablet 120 mg, 120 mg, Oral, Daily, Dezii, Alexandra, DO, 120 mg at 03/14/24 9074   LORazepam  (ATIVAN ) injection 2 mg, 2 mg, Intravenous, Q2H PRN, Niu, Xilin, MD   losartan  (COZAAR ) tablet 100 mg, 100 mg, Oral, Daily, Breeze, Shantelle, NP, 100 mg at 03/13/24 1720   ondansetron  (ZOFRAN ) injection 4 mg, 4 mg, Intravenous, Q8H PRN, Niu, Xilin, MD, 4 mg at 03/06/24 9189   QUEtiapine  (SEROQUEL ) tablet 25 mg, 25  mg, Oral, TID, Benetta Maclaren, MD, 25 mg at 03/14/24 9074   QUEtiapine  (SEROQUEL ) tablet 25 mg, 25 mg, Oral, Q8H PRN, Cherilyn Sautter, MD   QUEtiapine  (SEROQUEL ) tablet 50 mg, 50 mg, Oral, Daily, Laney Louderback, MD, 50 mg at 03/13/24 2013   sevelamer carbonate (RENVELA) tablet 1,600 mg, 1,600 mg, Oral, TID WC, Levorn, L[place] P, NP   sodium chloride  flush (NS) 0.9 % injection 10-40 mL, 10-40 mL, Intracatheter, PRN, Dezii, Alexandra, DO, 10 mL at 03/10/24 0830   spironolactone  (ALDACTONE ) tablet 25 mg, 25 mg, Oral, Daily, Lenon Pons L, MD, 25 mg at 03/14/24 9074   traZODone  (DESYREL ) tablet 25 mg, 25 mg, Oral, QHS PRN, Mansy, Jan A, MD, 25 mg at 03/12/24 2213  Allergies: Allergies  Allergen Reactions   Amlodipine  Swelling    Knees down to ankles Knees down to ankles   Hctz [Hydrochlorothiazide]     pancreatitis   Heparin  Other (See Comments)    Hx  HIT,   Pt reports cardiac arrest when given heparin    Lmw Heparin      Reports cardiac arrest when given heparin    Other Other (See Comments)    Reports cardiac arrest when given during surgery Reports cardiac arrest when given during surgery Reports cardiac arrest when given during surgery    Sulfa Antibiotics Rash   Lactose    Dairy Aid [Tilactase]     Runny nose   Hydralazine      Nausea and rash    Lynnley Doddridge, MD

## 2024-03-14 NOTE — Plan of Care (Signed)
  Problem: Education: Goal: Knowledge of disease or condition will improve Outcome: Progressing   Problem: Ischemic Stroke/TIA Tissue Perfusion: Goal: Complications of ischemic stroke/TIA will be minimized 03/14/2024 1702 by Merilee, Brinklee Cisse R, LPN Outcome: Progressing 03/14/2024 1700 by Merilee, Burwell Bethel R, LPN Outcome: Progressing   Problem: Coping: Goal: Will verbalize positive feelings about self 03/14/2024 1702 by Merilee, Ardine Iacovelli R, LPN Outcome: Progressing 03/14/2024 1700 by Merilee, Celenia Hruska R, LPN Outcome: Progressing Goal: Will identify appropriate support needs Outcome: Progressing   Problem: Health Behavior/Discharge Planning: Goal: Ability to manage health-related needs will improve Outcome: Progressing Goal: Goals will be collaboratively established with patient/family 03/14/2024 1702 by Merilee, Eilleen Davoli R, LPN Outcome: Progressing 03/14/2024 1700 by Merilee, Javonnie Illescas R, LPN Outcome: Progressing   Problem: Self-Care: Goal: Ability to participate in self-care as condition permits will improve Outcome: Progressing   Problem: Nutrition: Goal: Dietary intake will improve Outcome: Progressing

## 2024-03-14 NOTE — Plan of Care (Signed)
  Problem: Education: Goal: Knowledge of disease or condition will improve Outcome: Progressing   Problem: Ischemic Stroke/TIA Tissue Perfusion: Goal: Complications of ischemic stroke/TIA will be minimized Outcome: Progressing   Problem: Coping: Goal: Will verbalize positive feelings about self Outcome: Progressing   Problem: Health Behavior/Discharge Planning: Goal: Goals will be collaboratively established with patient/family Outcome: Progressing   Problem: Nutrition: Goal: Dietary intake will improve Outcome: Progressing

## 2024-03-14 NOTE — Progress Notes (Signed)
 Physical Therapy Treatment Patient Details Name: Tracy Jennings MRN: [id number] DOB: July 18, 1956 Today's Date: 03/14/2024   History of Present Illness Pt is a 68 year old female presents with slurred speech,  left facial spasm, left sided weakness.    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 Supervision with HOB raised to transfer to EOB requiring repeated cues to complete task keeping eyes closed 95% of session. Pt asked why it was so dark, Therapist had to remind her that her eyes were closed. ModA to stand from bed at [facility] and maneuver to bedside chair. Pt positioned to comfort with all needs in reach. STR may be most appropriate at this time.    If plan is discharge home, recommend the following: A little help with walking and/or transfers;A little help with bathing/dressing/bathroom;Assistance with cooking/housework;Assist for transportation;Help with stairs or ramp for entrance;Supervision due to cognitive status   Can travel by private vehicle     Yes  Equipment Recommendations  Rolling walker (2 wheels)    Recommendations for Other Services       Precautions / Restrictions Precautions Precautions: Fall Recall of Precautions/Restrictions: Impaired Restrictions Weight Bearing Restrictions Per Provider Order: No     Mobility  Bed Mobility Overal bed mobility: Needs Assistance Bed Mobility: Supine to Sit     Supine to sit: Supervision, HOB elevated     General bed mobility comments: Increased time to complete task due to lethargy with continuous cues    Transfers Overall transfer level: Needs assistance Equipment used: Rolling walker (2 wheels) Transfers: Sit to/from Stand Sit to Stand: Mod assist           General transfer comment: Heavy use of multimodal  cues to complete t/f and to ensure maximum safety with DME    Ambulation/Gait               General Gait Details: Pt unable to tolerate progressive gait due to lethargy, requiring MinA for safety and to advance a few steps with RW to bedside chair.   Stairs             Wheelchair Mobility     Tilt Bed    Modified Rankin (Stroke Patients Only)       Balance Overall balance assessment: Needs assistance Sitting-balance support: Feet supported, No upper extremity supported Sitting balance-Leahy Scale: Good     Standing balance support: Bilateral upper extremity supported, During functional activity, Reliant on assistive device for balance Standing balance-Leahy Scale: Fair Standing balance comment: reliant on RW for static standing. Poor dynamic with RW if lethargic                            Communication Communication Communication: No apparent difficulties  Cognition Arousal: Lethargic Behavior During Therapy: Flat affect   PT - Cognitive impairments: History of cognitive impairments                       PT - Cognition Comments: Pt required constant multimodal cues to stay on task and how to maneuver RW Following commands: Impaired Following commands impaired: Follows one step commands inconsistently    Cueing Cueing Techniques: Verbal cues, Gestural cues, Tactile cues, Visual cues  Exercises Other Exercises Other Exercises: Edu: On role of PT,  safe DME management, benefits of sitting up in the recliner vs the bed    General Comments General comments (skin integrity, edema, etc.): Pt received Seroquel  this am (scheduled TID) she was very lethargic, unable to keep her eyes open to properly participate in PT session      Pertinent Vitals/Pain Pain Assessment Pain Assessment: No/denies pain    Home Living                          Prior Function            PT Goals (current goals can now be found in the care plan  section) Acute Rehab PT Goals Patient Stated Goal: go home    Frequency    Min 2X/week      PT Plan      Co-evaluation              AM-PAC PT 6 Clicks Mobility   Outcome Measure  Help needed turning from your back to your side while in a flat bed without using bedrails?: None Help needed moving from lying on your back to sitting on the side of a flat bed without using bedrails?: None Help needed moving to and from a bed to a chair (including a wheelchair)?: A Little Help needed standing up from a chair using your arms (e.g., wheelchair or bedside chair)?: A Little Help needed to walk in hospital room?: A Little Help needed climbing 3-5 steps with a railing? : A Lot 6 Click Score: 19    End of Session Equipment Utilized During Treatment: Gait belt Activity Tolerance: Patient limited by lethargy Patient left: in chair;with call bell/phone within reach;with chair alarm set Nurse Communication: Mobility status PT Visit Diagnosis: Muscle weakness (generalized) (M62.81);Difficulty in walking, not elsewhere classified (R26.2)     Time: 8770-8762 PT Time Calculation (min) (ACUTE ONLY): 8 min  Charges:    $Therapeutic Activity: 8-22 mins PT General Charges $$ ACUTE PT VISIT: 1 Visit                    Darice Bohr, PTA  Darice JAYSON Bohr 03/14/2024, 2:11 PM

## 2024-03-14 NOTE — Progress Notes (Signed)
 Mobility Specialist - Progress Note   03/14/24 1500  Mobility  Activity Ambulated with assistance in room;Stood at bedside;Dangled on edge of bed  Level of Assistance Moderate assist, patient does 50-74%  Assistive Device Front wheel walker  Distance Ambulated (ft) 20 ft  Activity Response Tolerated well  Mobility visit 1 Mobility    Pt lying in bed upon arrival, utilizing RA. Pt agreeable to activity; however appears a little lethargic this date. Face cloth to increase arousal. Pt completed bed mobility independently. STS with minA and ambulation in room with min-modA as pt fatigues. Pt reports pain in lower back and BLE with mobility this date. Short standing rest break as pt places forearms onto RW. Increased flex-forward lean with fatigue. VC to stay close/inside RW. Pt does require directional cueing throughout session. RW negotiation to prevent obstacle collision. Pt returned to bed with alarm set, needs in reach.    Lennette Seip Mobility Specialist 03/14/24, 3:06 PM

## 2024-03-14 NOTE — Progress Notes (Signed)
 Occupational Therapy Treatment Patient Details Name: Tracy Jennings MRN: [id number] DOB: 1956-05-08 Today's Date: 03/14/2024   History of present illness Pt is a 68 year old female presents with slurred speech,  left facial spasm, left sided weakness.    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 Pt does have eyes closed often and reports getting medication to help her sleep but now having difficulty waking up. Sit >supine with min A and pt sits with supervision to wash hands and face. Pt stands with +2 assistance and use of RW to transfer to recliner chair for lunch. Call bell and all needed items within reach upon exiting the room.       If plan is discharge home, recommend the following:  A little help with walking and/or transfers;A little help with bathing/dressing/bathroom;Assistance with cooking/housework;Direct supervision/assist for medications management;Direct supervision/assist for financial management;Supervision due to cognitive status;Help with stairs or ramp for entrance;Assist for transportation   Equipment Recommendations  BSC/3in1       Precautions / Restrictions Precautions Precautions: Fall Recall of Precautions/Restrictions: Impaired       Mobility Bed Mobility Overal bed mobility: Needs Assistance Bed Mobility: Supine to Sit     Supine to sit: Supervision, HOB elevated Sit to supine: Supervision   General bed mobility comments: Increased time to complete task due to lethargy with continuous cues    Transfers Overall transfer level: Needs assistance Equipment used: Rolling walker (2 wheels) Transfers: Sit to/from Stand Sit to Stand: Mod assist                 Balance Overall balance assessment: Needs  assistance Sitting-balance support: Feet supported, No upper extremity supported Sitting balance-Leahy Scale: Good Sitting balance - Comments: Steady reaching within BOS   Standing balance support: Bilateral upper extremity supported, During functional activity, Reliant on assistive device for balance Standing balance-Leahy Scale: Fair Standing balance comment: reliant on RW for static standing. Poor dynamic with RW if lethargic                           ADL either performed or assessed with clinical judgement   ADL Overall ADL's : Needs assistance/impaired                         Toilet Transfer: Minimal assistance;Rolling walker (2 wheels) Toilet Transfer Details (indicate cue type and reason): simulated                Extremity/Trunk Assessment Upper Extremity Assessment Upper Extremity Assessment: Generalized weakness   Lower Extremity Assessment Lower Extremity Assessment: Generalized weakness        Vision Patient Visual Report: No change from baseline           Communication Communication Communication: No apparent difficulties   Cognition Arousal: Lethargic Behavior During Therapy: Flat affect                                 Following commands: Impaired Following commands impaired: Follows one step commands inconsistently      Cueing   Cueing Techniques: Verbal cues, Gestural cues, Tactile cues, Visual cues        General Comments Pt received Seroquel   this am (scheduled TID) she was very lethargic, unable to keep her eyes open to properly participate in PT session    Pertinent Vitals/ Pain       Pain Assessment Pain Assessment: No/denies pain         Frequency  Min 2X/week        Progress Toward Goals  OT Goals(current goals can now be found in the care plan section)  Progress towards OT goals: Progressing toward goals      AM-PAC OT 6 Clicks Daily Activity     Outcome Measure   Help from  another person eating meals?: A Little Help from another person taking care of personal grooming?: A Little Help from another person toileting, which includes using toliet, bedpan, or urinal?: A Lot Help from another person bathing (including washing, rinsing, drying)?: A Lot Help from another person to put on and taking off regular upper body clothing?: A Little   6 Click Score: 13    End of Session Equipment Utilized During Treatment: Rolling walker (2 wheels)  OT Visit Diagnosis: Other abnormalities of gait and mobility (R26.89);Muscle weakness (generalized) (M62.81);Cognitive communication deficit (R41.841)   Activity Tolerance Patient tolerated treatment well   Patient Left in chair;with call bell/phone within reach;with chair alarm set   Nurse Communication Mobility status        Time: 1221-1229 OT Time Calculation (min): 8 min  Charges: OT General Charges $OT Visit: 1 Visit OT Treatments $Therapeutic Activity: 8-22 mins  Izetta Claude, MS, OTR/L , CBIS ascom [phone]  03/14/24, 2:31 PM

## 2024-03-14 NOTE — Progress Notes (Signed)
 PROGRESS NOTE JASMA SEEVERS  FMW:[id number] DOB: 1956-04-15 DOA: 03/05/2024 PCP: Zachary Idelia LABOR, MD   Hospital Course:  Tracy Jennings is a 68 year old female with ESRD on HD MWF, HTN, HLD, DM, PVD, dementia, chronic recurrent pancreatitis, stroke, cervical cancer, HIT, who presents with slurred speech, left facial spasm, left-sided weakness.  Patient had syncopal episode on 6/23 and was seen in the ED.  At that time she was found to have O2 desaturation to 80% due to pulmonary edema on chest x-ray as well as hyperkalemia with a potassium of 6.2.  She had negative head CT during that admission.  She received hemodialysis and then was discharged home.  Her last known normal was 6/24 at 1700.  Upon arrival to the ED patient's confusion had resolved entirely.  Labs in the ED mostly unremarkable, potassium 4.6, creatinine consistent with ESRD.  Blood pressure was found to be 252/79.  Patient was placed in telemetry bed for observation.  Code stroke was called.Blood pressure resolved.  Head CT was without acute intracranial abnormality.  Chronic infarct seen on right parietal lobe and extensive chronic microvascular ischemic changes with remote lacunar infarcts in the right caudate and right thalamus appreciated.  Patient was admitted for workup of presumed TIA.  LDL 96, hemoglobin A1c 5.1%.  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  MRI brain revealed chronic encephalomalacia within the right parietal lobe and advanced diffuse cerebral white matter disease.  Patient underwent carotid Dopplers which revealed elevated velocity within the distal right internal carotid artery suggesting stenosis of 50 to 69% though no significant plaque identified and thus this may be artifactual. Patient underwent hemodialysis on 6/25 but left her session early AMA.she attempted to leave the hospital AMA but during this event began hallucinating and loss capacity.  Psychiatry was consulted and met  with the patient on 6/26 and confirms acute delirium, currently the patient does not have capacity to make medical decisions.  She has been started on Seroquel  for presumed Lewy body dementia.  She has had persistent agitation and Seroquel  has been slowly increased. She is currently medically stable and undergoing routine HD while awaiting safe dispo. TOC is consulted.   Subjective: Patient states that she is feeling well. Denies complaints.   Objective: Vitals:   03/13/24 1700 03/13/24 1720 03/13/24 2047 03/14/24 0430  BP: (!) 189/62 (!) 189/62 (!) 173/61 (!) 172/48  Pulse: 65 65 68 69  Resp:   16 16  Temp:   98.7 F (37.1 C) 98.5 F (36.9 C)  TempSrc:      SpO2:   96% 98%  Weight:      Height:       Filed Weights   03/11/24 1747 03/13/24 0731 03/13/24 1100  Weight: 56.6 kg 53 kg 52 kg   Examination: General exam: Appears calm and comfortable, NAD  Respiratory system: Normal work of breathing, symmetric chest wall expansion Cardiovascular system: S1 & S2 heard, RRR.  Gastrointestinal system: Abdomen is nondistended, soft and nontender.  Neuro: alert and oriented to self, location Extremities: Symmetric, expected ROM Skin: No rashes, lesions Psychiatry: flat mood.   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 No outpatient work up.  Presumed Lewy body dementia at this time - Mentation and capacity continue to change regularly - Psychiatry has been consulted - Patient is currently on Seroquel - increased last on 6/29. Has been adherent recently.  - As needed Haldol .  - Please see  psychiatry notes for more details, appreciate further psychiatry recommendations.- Presently patient does not have capacity to make medical decisions or leave AMA.  We have significant concerns about her ability to take care of herself at home.  She will likely require placement for safety  Hypertensive urgency - Systolic blood pressure severely elevated above 200. - Resume all home meds (patient listed on amlodipine  but also has allergy.  On alternative CCB.) - Continue daily blood pressure titration.  Much improved when she takes all medications.  Medication adherence currently limited by her underlying dementia. - poorly controlled on current regimen- added spironolactone  as all medications are currently at max recommended dose and has allergies to several other options. She declined her clonidine  last night so significantly elevated pressures this am.   TIA rule out- MRA: Moderate stenosis of proximal basilar artery at 50% - MRI brain: Chronic encephalomalacia with the right parietal lobe and advanced diffuse cerebral white matter disease - Carotid Dopplers: Elevated velocity within the distal right internal carotid artery suggesting stenosis of 50 to 69% though no significant plaque identified and thus may be artifactual. - EEG: Cortical dysfunction arising from right temporal parietal region secondary to underlying structural abnormality.  No seizures or epileptiform discharges. - Echocardiogram: EF 55 to 60%, no regional wall motion abnormalities, severe left ventricular hypertrophy, grade 2 diastolic dysfunction elevated pulmonary arterial systolic pressures.  Calcification of aortic valve - Continue statin, continue aspirin . - No events on telemetry.  Have discontinued further monitoring - Lipids and A1c at goal. - Neurology consulted.  Believes patient may have Lewy body dementia as above  Pulmonary arterial hypertension Grade 2 diastolic dysfunction - As seen on echocardiogram -  Continue GDMT - Tight blood pressure control as able - Remains clinically euvolemic. Volume to be removed via dialysis if needed  ESRD Electrolyte disturbance - On HD MWF - Nephrology consulted during this admission, continue with dialysis per renal  Elevated troponin - No chest pain, no shortness of breath.  Troponin 41 -> 43. likely secondary to demand ischemia and ESRD - Continue aspirin  and Lipitor  Type 2 diabetes with renal manifestations - Recent hemoglobin A1c 5.1%, well-controlled.  Not currently taking medications - Daily CBG, currently at goal  Anemia of chronic disease, ESRD - Trend CBC. - Hemoglobin currently stable  Hyperlipidemia - Statin  Heparin -induced thrombocytopenia - Avoid heparin  or Lovenox  products  - SCDs as needed  DVT prophylaxis: SCDs   Code Status: Full Code Disposition:  Inpatient, needs placement. TOC consulted to arrange. Awaiting updates Consultants:  nephrology Psychiatry   Procedures:  HD  Data Reviewed: I have personally reviewed following labs and imaging studies CBC: Recent Labs  Lab 03/08/24 0802 03/13/24 0745  WBC 4.0 4.4  HGB 10.2* 10.1*  HCT 31.0* 31.7*  MCV 92.5 94.1  PLT 144* 189   Basic Metabolic Panel: Recent Labs  Lab 03/08/24 0802 03/13/24 0745  NA 133* 134*  K 4.4 4.6  CL 94* 94*  CO2 27 27  GLUCOSE 120* 95  BUN 42* 49*  CREATININE 7.84* 8.52*  CALCIUM  8.6* 9.5  PHOS 6.2* 5.6*   GFR: Estimated Creatinine Clearance: 5.3 mL/min (A) (  by C-G formula based on SCr of 8.52 mg/dL (H)).   LOS: 7 days    Marien LITTIE Piety, DO [facility]  To contact the attending physician between 7A-7P please use Epic Chat. To contact the covering physician during after hours 7P-7A, please review Amion.  03/14/2024, 8:01 AM

## 2024-03-14 NOTE — Progress Notes (Signed)
 [facility]  ROUNDING NOTE   Subjective:   Tracy Jennings is a 68 year old female with past medical conditions including dementia, hypertension, diabetes, hyperlipidemia, cervical cancer, recurrent pancreatitis, PVD, and end-stage renal disease on hemodialysis.  Patient presents to the emergency department after experiencing slurred speech, left-sided facial spasms and weakness.  Patient has been admitted for TIA (transient ischemic attack) [G45.9] Hallucination [R44.3]  Patient is known to our practice and receives outpatient dialysis treatments at [facility] on a MWF schedule, supervised by Dr. Dennise.   Update: Patient underwent hemodialysis treatment yesterday. Patient tolerated well. Resting comfortably in bed today.   Objective:  Vital signs in last 24 hours:  Temp:  [97.6 F (36.4 C)-98.7 F (37.1 C)] 98.7 F (37.1 C) (07/03 0800) Pulse Rate:  [64-69] 68 (07/03 0800) Resp:  [14-16] 14 (07/03 0800) BP: (161-192)/(48-62) 192/58 (07/03 0800) SpO2:  [94 %-98 %] 94 % (07/03 0800)  Weight change:  Filed Weights   03/11/24 1747 03/13/24 0731 03/13/24 1100  Weight: 56.6 kg 53 kg 52 kg    Intake/Output: I/O last 3 completed shifts: In: 0  Out: 1000 [Other:1000]   Intake/Output this shift:  No intake/output data recorded.  Physical Exam: General: NAD  Head: Normocephalic, atraumatic. Moist oral mucosal membranes  Eyes: Anicteric  Neck: Supple  Lungs:  Clear to auscultation, normal effort  Heart: Regular rate and rhythm  Abdomen:  Soft, nontender  Extremities: No peripheral edema.  Neurologic: Resting comfortably  Skin: Warm,dry, no rash  Access: Left aVF    Basic Metabolic Panel: Recent Labs  Lab 03/08/24 0802 03/13/24 0745  NA 133* 134*  K 4.4 4.6  CL 94* 94*  CO2 27 27  GLUCOSE 120* 95  BUN 42* 49*  CREATININE 7.84* 8.52*  CALCIUM  8.6* 9.5  PHOS 6.2* 5.6*    Liver Function Tests: Recent Labs  Lab 03/08/24 0802  03/13/24 0745  ALBUMIN 3.3* 3.4*   No results for input(s): LIPASE, AMYLASE in the last 168 hours. No results for input(s): AMMONIA in the last 168 hours.  CBC: Recent Labs  Lab 03/08/24 0802 03/13/24 0745  WBC 4.0 4.4  HGB 10.2* 10.1*  HCT 31.0* 31.7*  MCV 92.5 94.1  PLT 144* 189    Cardiac Enzymes: No results for input(s): CKTOTAL, CKMB, CKMBINDEX, TROPONINI in the last 168 hours.  BNP: Invalid input(s): POCBNP  CBG: Recent Labs  Lab 03/10/24 0728 03/11/24 0738 03/12/24 0742 03/14/24 0802 03/14/24 1113  GLUCAP 107* 101* 118* 76 102*    Microbiology: Results for orders placed or performed during the hospital encounter of 09/14/22  Resp panel by RT-PCR (RSV, Flu A&B, Covid) Anterior Nasal Swab     Status: None   Collection Time: 09/14/22  2:45 AM   Specimen: Anterior Nasal Swab  Result Value Ref Range Status   SARS Coronavirus 2 by RT PCR NEGATIVE NEGATIVE Final    Comment: (NOTE) SARS-CoV-2 target nucleic acids are NOT DETECTED.  The SARS-CoV-2 RNA is generally detectable in upper respiratory specimens during the acute phase of infection. The lowest concentration of SARS-CoV-2 viral copies this assay can detect is 138 copies/mL. A negative result does not preclude SARS-Cov-2 infection and should not be used as the sole basis for treatment or other patient management decisions. A negative result may occur with  improper specimen collection/handling, submission of specimen other than nasopharyngeal swab, presence of viral mutation(s) within the areas targeted by this assay, and inadequate number of viral copies(<138 copies/mL). A negative  result must be combined with clinical observations, patient history, and epidemiological information. The expected result is Negative.  Fact Sheet for Patients:  BloggerCourse.com  Fact Sheet for Healthcare Providers:  SeriousBroker.it  This test is no t  yet approved or cleared by the [place]  FDA and  has been authorized for detection and/or diagnosis of SARS-CoV-2 by FDA under an Emergency Use Authorization (EUA). This EUA will remain  in effect (meaning this test can be used) for the duration of the COVID-19 declaration under Section 564(b)(1) of the Act, 21 U.S.C.section 360bbb-3(b)(1), unless the authorization is terminated  or revoked sooner.       Influenza A by PCR NEGATIVE NEGATIVE Final   Influenza B by PCR NEGATIVE NEGATIVE Final    Comment: (NOTE) The Xpert Xpress SARS-CoV-2/FLU/RSV plus assay is intended as an aid in the diagnosis of influenza from Nasopharyngeal swab specimens and should not be used as a sole basis for treatment. Nasal washings and aspirates are unacceptable for Xpert Xpress SARS-CoV-2/FLU/RSV testing.  Fact Sheet for Patients: BloggerCourse.com  Fact Sheet for Healthcare Providers: SeriousBroker.it  This test is not yet approved or cleared by the [place]  FDA and has been authorized for detection and/or diagnosis of SARS-CoV-2 by FDA under an Emergency Use Authorization (EUA). This EUA will remain in effect (meaning this test can be used) for the duration of the COVID-19 declaration under Section 564(b)(1) of the Act, 21 U.S.C. section 360bbb-3(b)(1), unless the authorization is terminated or revoked.     Resp Syncytial Virus by PCR NEGATIVE NEGATIVE Final    Comment: (NOTE) Fact Sheet for Patients: BloggerCourse.com  Fact Sheet for Healthcare Providers: SeriousBroker.it  This test is not yet approved or cleared by the [place]  FDA and has been authorized for detection and/or diagnosis of SARS-CoV-2 by FDA under an Emergency Use Authorization (EUA). This EUA will remain in effect (meaning this test can be used) for the duration of the COVID-19 declaration under Section 564(b)(1)  of the Act, 21 U.S.C. section 360bbb-3(b)(1), unless the authorization is terminated or revoked.  Performed at [facility], [address] [place], [place] [address]     Coagulation Studies: No results for input(s): LABPROT, INR in the last 72 hours.   Urinalysis: No results for input(s): COLORURINE, LABSPEC, PHURINE, GLUCOSEU, HGBUR, BILIRUBINUR, KETONESUR, PROTEINUR, UROBILINOGEN, NITRITE, LEUKOCYTESUR in the last 72 hours.  Invalid input(s): APPERANCEUR    Imaging: No results found.    Medications:     aspirin   300 mg Rectal Daily   Or   aspirin   325 mg Oral Daily   atorvastatin   40 mg Oral Daily   carvedilol   25 mg Oral BID WC   Chlorhexidine  Gluconate Cloth  6 each Topical Daily   cloNIDine   0.3 mg Oral TID   diltiazem   120 mg Oral Daily   doxazosin   4 mg Oral Daily   isosorbide  mononitrate  120 mg Oral Daily   losartan   100 mg Oral Daily   QUEtiapine   25 mg Oral TID   QUEtiapine   50 mg Oral Daily   sevelamer carbonate  1,600 mg Oral TID WC   spironolactone   25 mg Oral Daily   acetaminophen  **OR** acetaminophen  (TYLENOL ) oral liquid 160 mg/5 mL **OR** acetaminophen , haloperidol  lactate, LORazepam , ondansetron  (ZOFRAN ) IV, QUEtiapine , sodium chloride  flush, traZODone   Assessment/ Plan:  Tracy Jennings is a 68 y.o.  female with past medical conditions including dementia, hypertension, diabetes, hyperlipidemia, cervical cancer, recurrent pancreatitis, PVD, and end-stage renal disease  on hemodialysis.  Patient presents to the emergency department after experiencing slurred speech, left-sided facial spasms and weakness.  Patient has been admitted for TIA (transient ischemic attack) [G45.9] Hallucination [R44.3]  [facility]/MWF/Lt AVG    End-stage renal disease on hemodialysis.  Patient underwent hemodialysis treatment yesterday.  UF achieved was 0.8 kg.  We will plan for hemodialysis treatment again  tomorrow.  Transient ischemic attack, reporting symptoms of slurred speech, involuntary left-sided facial spasms and weakness.  Symptoms resolved.  Neurology consulted, TIA versus partial complex seizure.  Brain imaging negative for acute infarct.  Carotid ultrasound shows right-sided 50 to 60% stenosis without plaque.    3.  Hypertensive urgency, blood pressure severely elevated on admission.  Home regimen includes amlodipine , carvedilol , doxazosin , losartan , and isosorbide .  Currently receiving carvedilol , clonidine , diltiazem , doxazosin , isosorbide , and losartan .  Amlodipine  remains held.  Blood pressure continues to be labile.  Current blood pressure 192/58.  4. Secondary Hyperparathyroidism: with outpatient labs:   Lab Results  Component Value Date   CALCIUM  9.5 03/13/2024   PHOS 5.6 (H) 03/13/2024    Phosphorus close at target at 5.6.  Maintain the patient on Renvela 1600 mg 3 times daily.    LOS: 7 Belinda Schlichting 7/3/20251:32 PM

## 2024-03-15 DIAGNOSIS — G459 Transient cerebral ischemic attack, unspecified: Secondary | ICD-10-CM | POA: Diagnosis not present

## 2024-03-15 LAB — CBC
HCT: 32 % — ABNORMAL LOW (ref 36.0–46.0)
Hemoglobin: 10.4 g/dL — ABNORMAL LOW (ref 12.0–15.0)
MCH: 30.1 pg (ref 26.0–34.0)
MCHC: 32.5 g/dL (ref 30.0–36.0)
MCV: 92.5 fL (ref 80.0–100.0)
Platelets: 192 K/uL (ref 150–400)
RBC: 3.46 MIL/uL — ABNORMAL LOW (ref 3.87–5.11)
RDW: 14.4 % (ref 11.5–15.5)
WBC: 4.2 K/uL (ref 4.0–10.5)
nRBC: 0 % (ref 0.0–0.2)

## 2024-03-15 LAB — RENAL FUNCTION PANEL
Albumin: 3.4 g/dL — ABNORMAL LOW (ref 3.5–5.0)
Anion gap: 14 (ref 5–15)
BUN: 41 mg/dL — ABNORMAL HIGH (ref 8–23)
CO2: 27 mmol/L (ref 22–32)
Calcium: 9 mg/dL (ref 8.9–10.3)
Chloride: 90 mmol/L — ABNORMAL LOW (ref 98–111)
Creatinine, Ser: 7.95 mg/dL — ABNORMAL HIGH (ref 0.44–1.00)
GFR, Estimated: 5 mL/min — ABNORMAL LOW (ref >60)
Glucose, Bld: 104 mg/dL — ABNORMAL HIGH (ref 70–99)
Phosphorus: 5.2 mg/dL — ABNORMAL HIGH (ref 2.5–4.6)
Potassium: 4.3 mmol/L (ref 3.5–5.1)
Sodium: 131 mmol/L — ABNORMAL LOW (ref 135–145)

## 2024-03-15 MED ORDER — SODIUM CHLORIDE 0.9% FLUSH
10.0000 mL | Freq: Two times a day (BID) | INTRAVENOUS | Status: DC
  Administered 2024-03-15 – 2024-03-19 (×9): 10 mL
  Administered 2024-03-20: 20 mL
  Administered 2024-03-20 – 2024-04-02 (×25): 10 mL

## 2024-03-15 MED ORDER — HYDRALAZINE HCL 20 MG/ML IJ SOLN
10.0000 mg | Freq: Once | INTRAMUSCULAR | Status: AC
  Administered 2024-03-16: 10 mg via INTRAVENOUS
  Filled 2024-03-15: qty 1

## 2024-03-15 MED ORDER — SODIUM CHLORIDE 0.9% FLUSH: 10.0000 mL | INTRAVENOUS | Status: DC | PRN

## 2024-03-15 NOTE — Progress Notes (Signed)
 Progress Note   Patient: Tracy Jennings FMW:[id number] DOB: [birth year] DOA: 03/05/2024     8 DOS: the patient was seen and examined on 03/15/2024   Brief hospital course: Tracy Jennings is a 68 year old female with ESRD on HD MWF, HTN, HLD, DM, PVD, dementia, chronic recurrent pancreatitis, stroke, cervical cancer, HIT, who presents with slurred speech, left facial spasm, left-sided weakness.  Patient had syncopal episode on 6/23 and was seen in the ED.  At that time she was found to have O2 desaturation to 80% due to pulmonary edema on chest x-ray as well as hyperkalemia with a potassium of 6.2.  She had negative head CT during that admission.  She received hemodialysis and then was discharged home.  Her last known normal was 6/24 at 1700.  Upon arrival to the ED patient's confusion had resolved entirely.  Labs in the ED mostly unremarkable, potassium 4.6, creatinine consistent with ESRD.  Blood pressure was found to be 252/79.  Patient was placed in telemetry bed for observation.  Code stroke was called.Blood pressure resolved.  Head CT was without acute intracranial abnormality.  Chronic infarct seen on right parietal lobe and extensive chronic microvascular ischemic changes with remote lacunar infarcts in the right caudate and right thalamus appreciated.  Patient was admitted for workup of presumed TIA.  LDL 96, hemoglobin A1c 5.1%.  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  MRI brain revealed chronic encephalomalacia within the right parietal lobe and advanced diffuse cerebral white matter disease.  Patient underwent carotid Dopplers which revealed elevated velocity within the distal right internal carotid artery suggesting stenosis of 50 to 69% though no significant plaque identified and thus this may be artifactual. Patient underwent hemodialysis on 6/25 but left her session early AMA.she attempted to leave the hospital AMA but during this event began hallucinating and  loss capacity.  Psychiatry was consulted and met with the patient on 6/26 and confirms acute delirium, currently the patient does not have capacity to make medical decisions.  She has been started on Seroquel  for presumed Lewy body dementia.  She has had persistent agitation and Seroquel  has been slowly increased. She is currently medically stable and undergoing routine HD while awaiting safe dispo. TOC is consulted.  BP is labile and high   Assessment and Plan:  Dementia with behavioral disturbance - Remote history of dementia. No outpatient work up.  Presumed Lewy body dementia at this time - Mentation and capacity continue to change regularly - Psychiatry has been consulted - Patient is currently on Seroquel - increased last on 6/29. Has been adherent recently.  - As needed Haldol .  - Please see psychiatry notes for more details, appreciate further psychiatry recommendations.- Presently patient does not have capacity to make medical decisions or leave AMA.  We have significant concerns about her ability to take care of herself at home.  She will likely require placement for safety   Hypertensive urgency - Systolic blood pressure severely elevated above 200. - Resume all home meds (patient listed on amlodipine  but also has allergy.  On alternative CCB.) - Continue daily blood pressure titration.  Much improved when she takes all medications.  Medication adherence currently limited by her underlying dementia. - poorly controlled on current regimen- added spironolactone  as all medications are currently at max recommended dose and has allergies to several other options. She declined her clonidine  last night so significantly elevated pressures this am.    TIA rule out- MRA: Moderate stenosis of  proximal basilar artery at 50% - MRI brain: Chronic encephalomalacia with the right parietal lobe and advanced diffuse cerebral white matter disease - Carotid Dopplers: Elevated velocity within the distal  right internal carotid artery suggesting stenosis of 50 to 69% though no significant plaque identified and thus may be artifactual. - EEG: Cortical dysfunction arising from right temporal parietal region secondary to underlying structural abnormality.  No seizures or epileptiform discharges. - Echocardiogram: EF 55 to 60%, no regional wall motion abnormalities, severe left ventricular hypertrophy, grade 2 diastolic dysfunction elevated pulmonary arterial systolic pressures.  Calcification of aortic valve - Continue statin, continue aspirin . - No events on telemetry.  Have discontinued further monitoring - Lipids and A1c at goal. - Neurology consulted.  Believes patient may have Lewy body dementia as above   Pulmonary arterial hypertension Grade 2 diastolic dysfunction - As seen on echocardiogram - Continue GDMT - Tight blood pressure control as able - Remains clinically euvolemic. Volume to be removed via dialysis if needed   ESRD Electrolyte disturbance - On HD MWF - Nephrology consulted during this admission, continue with dialysis per renal   Elevated troponin - No chest pain, no shortness of breath.  Troponin 41 -> 43. likely secondary to demand ischemia and ESRD - Continue aspirin  and Lipitor   Type 2 diabetes with renal manifestations - Recent hemoglobin A1c 5.1%, well-controlled.  Not currently taking medications - Daily CBG, currently at goal   Anemia of chronic disease, ESRD - Trend CBC. - Hemoglobin currently stable   Hyperlipidemia - Statin   Heparin -induced thrombocytopenia - Avoid heparin  or Lovenox  products  - SCDs as needed     Subjective: none but BP remains high and labile  Physical Exam: Vitals:   03/15/24 1100 03/15/24 1130 03/15/24 1200 03/15/24 1559  BP: (!) 231/71 (!) 173/97 (!) 221/83 (!) 205/55  Pulse: 71 75 71 70  Resp: 13 20 15 16   Temp:   98.3 F (36.8 C) 98.1 F (36.7 C)  TempSrc:   Oral   SpO2: 100% 100% 100% 92%  Weight:       Height:       Constitutional: Alert, awake, calm, comfortable HEENT: Neck supple Respiratory: clear to auscultation bilaterally, no wheezing, no crackles. Normal respiratory effort. No accessory muscle use.  Cardiovascular: Regular rate and rhythm, no murmurs / rubs / gallops. No extremity edema. 2+ pedal pulses. No carotid bruits.  Abdomen: no tenderness, no masses palpated. No hepatosplenomegaly. Bowel sounds positive.  Musculoskeletal: no clubbing / cyanosis. No joint deformity upper and lower extremities. Good ROM, no contractures. Normal muscle tone.  Skin: no rashes, lesions, ulcers. No induration Neurologic: CN 2-12 grossly intact. Sensation intact, DTR normal. Strength 5/5 x all 4 extremities.  Psychiatric: Normal judgment and insight. Alert and oriented x 3. Normal mood.     Data Reviewed:  reviewed  Family Communication: Sisters [name] and Phylis, and brother [name]  Disposition: Status is: Inpatient Remains inpatient appropriate because: Uncontrolled BP   Planned Discharge Destination: Skilled nursing facility vs Home    Time spent: 37 minutes  Author: Nena Rebel, MD 03/15/2024 5:16 PM  For on call review [email or web address].

## 2024-03-15 NOTE — TOC Progression Note (Addendum)
 Transition of Care [facility]) - Progression Note    Patient Details  Name: Tracy Jennings MRN: [id number] Date of Birth: [birth year]  [facility] CM/SW Contact  Tracy LULLA Capri, RN 03/15/2024, 3:50 PM  Clinical Narrative:     CM follow up call placed to patient's sister, Tracy Jennings, phone: [phone] regarding SNF preferences; currently 2 accepting SNF offers, [facility] and [facility].    CM to patient's room to confirm if patient family at patient's bedside. Patient's sister, Tracy Jennings at patient's bedside and  declined SNF and states patient family will take patient home and agrees with home health RN/PT/OT/HHA. CM alert to Dr. Roann regarding patient's sister preference.   Orders noted for home health for RN and Physical Therapy. CM call to [facility], phone: [phone] regarding home health orders. Per [facility] has accepted patient for home health services.   Noted, DME orders for rolling walker and 3 in 1 bedside commode. Patient has diagnosis of Transient Ischemic Attack (TIA). Per Dr. Paudel, patient requires 3 in 1 bedside commode to treat dizziness. Patient's condition impairs their ability to ambulate to the bathroom. Patient is confined to one room and does not have a bathroom within safe distance that she can use.     Expected Discharge Plan: Skilled Nursing Facility Barriers to Discharge: No SNF bed  Expected Discharge Plan and Services   Discharge Planning Services: CM Consult   Living arrangements for the past 2 months: Single Family Home       Social Determinants of Health (SDOH) Interventions SDOH Screenings   Food Insecurity: Food Insecurity Present (03/06/2024)  Housing: Low Risk  (03/06/2024)  Transportation Needs: No Transportation Needs (03/06/2024)  Utilities: Not At Risk (03/06/2024)  Financial Resource Strain: Low Risk  (01/09/2024)   Received from [facility]   Social Connections: Unknown (03/06/2024)  Tobacco Use: Low Risk  (03/06/2024)    Readmission Risk Interventions     No data to display

## 2024-03-15 NOTE — Plan of Care (Signed)
  Problem: Ischemic Stroke/TIA Tissue Perfusion: Goal: Complications of ischemic stroke/TIA will be minimized Outcome: Progressing   Problem: Coping: Goal: Will identify appropriate support needs Outcome: Progressing   Problem: Health Behavior/Discharge Planning: Goal: Goals will be collaboratively established with patient/family Outcome: Progressing   Problem: Nutrition: Goal: Dietary intake will improve Outcome: Progressing   Problem: Pain Managment: Goal: General experience of comfort will improve and/or be controlled Outcome: Progressing   Problem: Safety: Goal: Ability to remain free from injury will improve Outcome: Progressing

## 2024-03-15 NOTE — Progress Notes (Signed)
 [facility]  ROUNDING NOTE   Subjective:   Tracy Jennings is a 68 year old female with past medical conditions including dementia, hypertension, diabetes, hyperlipidemia, cervical cancer, recurrent pancreatitis, PVD, and end-stage renal disease on hemodialysis.  Patient presents to the emergency department after experiencing slurred speech, left-sided facial spasms and weakness.  Patient has been admitted on 03/05/2024 for TIA (transient ischemic attack) [G45.9] Hallucination [R44.3]  Patient is known to our practice and receives outpatient dialysis treatments at [facility] on a MWF schedule, supervised by Dr. Dennise.   Seen and examined on hemodialysis treatment. Resting comfortably.     HEMODIALYSIS FLOWSHEET:  Blood Flow Rate (mL/min): 400 mL/min Arterial Pressure (mmHg): -146 mmHg Venous Pressure (mmHg): 340 mmHg TMP (mmHg): 2 mmHg Ultrafiltration Rate (mL/min): 686 mL/min Dialysate Flow Rate (mL/min): 300 ml/min Dialysis Fluid Bolus: Normal Saline Bolus Amount (mL): 300 mL    Objective:  Vital signs in last 24 hours:  Temp:  [98.2 F (36.8 C)] 98.2 F (36.8 C) (07/04 0750) Pulse Rate:  [63-72] 66 (07/04 0930) Resp:  [10-20] 12 (07/04 0930) BP: (133-215)/(47-95) 215/60 (07/04 0930) SpO2:  [94 %-100 %] 100 % (07/04 0930) Weight:  [52 kg] 52 kg (07/04 0750)  Weight change:  Filed Weights   03/13/24 0731 03/13/24 1100 03/15/24 0750  Weight: 53 kg 52 kg 52 kg    Intake/Output: I/O last 3 completed shifts: In: 5 [IV Piggyback:5] Out: -    Intake/Output this shift:  No intake/output data recorded.  Physical Exam: General: NAD, laying in bed  Head: Normocephalic, atraumatic. Moist oral mucosal membranes  Eyes: Anicteric  Neck: Supple  Lungs:  Clear to auscultation, normal effort  Heart: Regular rate and rhythm  Abdomen:  Soft, nontender  Extremities: No peripheral edema.  Neurologic: Resting comfortably  Skin: Warm,dry, no rash   Access: Left aVF    Basic Metabolic Panel: Recent Labs  Lab 03/13/24 0745 03/15/24 0800  NA 134* 131*  K 4.6 4.3  CL 94* 90*  CO2 27 27  GLUCOSE 95 104*  BUN 49* 41*  CREATININE 8.52* 7.95*  CALCIUM  9.5 9.0  PHOS 5.6* 5.2*    Liver Function Tests: Recent Labs  Lab 03/13/24 0745 03/15/24 0800  ALBUMIN 3.4* 3.4*   No results for input(s): LIPASE, AMYLASE in the last 168 hours. No results for input(s): AMMONIA in the last 168 hours.  CBC: Recent Labs  Lab 03/13/24 0745 03/15/24 0417  WBC 4.4 4.2  HGB 10.1* 10.4*  HCT 31.7* 32.0*  MCV 94.1 92.5  PLT 189 192    Cardiac Enzymes: No results for input(s): CKTOTAL, CKMB, CKMBINDEX, TROPONINI in the last 168 hours.  BNP: Invalid input(s): POCBNP  CBG: Recent Labs  Lab 03/11/24 0738 03/12/24 0742 03/14/24 0802 03/14/24 1113 03/14/24 1629  GLUCAP 101* 118* 76 102* 130*    Microbiology: Results for orders placed or performed during the hospital encounter of 09/14/22  Resp panel by RT-PCR (RSV, Flu A&B, Covid) Anterior Nasal Swab     Status: None   Collection Time: 09/14/22  2:45 AM   Specimen: Anterior Nasal Swab  Result Value Ref Range Status   SARS Coronavirus 2 by RT PCR NEGATIVE NEGATIVE Final    Comment: (NOTE) SARS-CoV-2 target nucleic acids are NOT DETECTED.  The SARS-CoV-2 RNA is generally detectable in upper respiratory specimens during the acute phase of infection. The lowest concentration of SARS-CoV-2 viral copies this assay can detect is 138 copies/mL. A negative result does not preclude SARS-Cov-2  infection and should not be used as the sole basis for treatment or other patient management decisions. A negative result may occur with  improper specimen collection/handling, submission of specimen other than nasopharyngeal swab, presence of viral mutation(s) within the areas targeted by this assay, and inadequate number of viral copies(<138 copies/mL). A negative result must  be combined with clinical observations, patient history, and epidemiological information. The expected result is Negative.  Fact Sheet for Patients:  BloggerCourse.com  Fact Sheet for Healthcare Providers:  SeriousBroker.it  This test is no t yet approved or cleared by the [place]  FDA and  has been authorized for detection and/or diagnosis of SARS-CoV-2 by FDA under an Emergency Use Authorization (EUA). This EUA will remain  in effect (meaning this test can be used) for the duration of the COVID-19 declaration under Section 564(b)(1) of the Act, 21 U.S.C.section 360bbb-3(b)(1), unless the authorization is terminated  or revoked sooner.       Influenza A by PCR NEGATIVE NEGATIVE Final   Influenza B by PCR NEGATIVE NEGATIVE Final    Comment: (NOTE) The Xpert Xpress SARS-CoV-2/FLU/RSV plus assay is intended as an aid in the diagnosis of influenza from Nasopharyngeal swab specimens and should not be used as a sole basis for treatment. Nasal washings and aspirates are unacceptable for Xpert Xpress SARS-CoV-2/FLU/RSV testing.  Fact Sheet for Patients: BloggerCourse.com  Fact Sheet for Healthcare Providers: SeriousBroker.it  This test is not yet approved or cleared by the [place]  FDA and has been authorized for detection and/or diagnosis of SARS-CoV-2 by FDA under an Emergency Use Authorization (EUA). This EUA will remain in effect (meaning this test can be used) for the duration of the COVID-19 declaration under Section 564(b)(1) of the Act, 21 U.S.C. section 360bbb-3(b)(1), unless the authorization is terminated or revoked.     Resp Syncytial Virus by PCR NEGATIVE NEGATIVE Final    Comment: (NOTE) Fact Sheet for Patients: BloggerCourse.com  Fact Sheet for Healthcare Providers: SeriousBroker.it  This test is not  yet approved or cleared by the [place]  FDA and has been authorized for detection and/or diagnosis of SARS-CoV-2 by FDA under an Emergency Use Authorization (EUA). This EUA will remain in effect (meaning this test can be used) for the duration of the COVID-19 declaration under Section 564(b)(1) of the Act, 21 U.S.C. section 360bbb-3(b)(1), unless the authorization is terminated or revoked.  Performed at [facility], [address] [place], [place] [address]     Coagulation Studies: No results for input(s): LABPROT, INR in the last 72 hours.   Urinalysis: No results for input(s): COLORURINE, LABSPEC, PHURINE, GLUCOSEU, HGBUR, BILIRUBINUR, KETONESUR, PROTEINUR, UROBILINOGEN, NITRITE, LEUKOCYTESUR in the last 72 hours.  Invalid input(s): APPERANCEUR    Imaging: No results found.    Medications:     aspirin   300 mg Rectal Daily   Or   aspirin   325 mg Oral Daily   atorvastatin   40 mg Oral Daily   carvedilol   25 mg Oral BID WC   Chlorhexidine  Gluconate Cloth  6 each Topical Daily   cloNIDine   0.3 mg Oral TID   diltiazem   120 mg Oral Daily   doxazosin   4 mg Oral Daily   isosorbide  mononitrate  120 mg Oral Daily   losartan   100 mg Oral Daily   QUEtiapine   25 mg Oral TID   QUEtiapine   50 mg Oral Daily   sevelamer  carbonate  1,600 mg Oral TID WC   spironolactone   25 mg Oral Daily  acetaminophen  **OR** acetaminophen  (TYLENOL ) oral liquid 160 mg/5 mL **OR** acetaminophen , haloperidol  lactate, LORazepam , ondansetron  (ZOFRAN ) IV, QUEtiapine , sodium chloride  flush, traZODone   Assessment/ Plan:  [name] is a 68 y.o.  female with past medical conditions including end stage renal disease on hemodialysis, dementia, hypertension, diabetes, hyperlipidemia, cervical cancer, recurrent pancreatitis, PVD, and end-stage renal disease on hemodialysis.  Patient presents to the emergency department after experiencing slurred speech,  left-sided facial spasms and weakness.  Patient has been admitted for TIA (transient ischemic attack) [G45.9] Hallucination [R44.3]  [facility]/MWF/Lt AVG    End-stage renal disease on hemodialysis.  Seen and examined on hemodialysis. Tolerating treatment. Dialysis for MWF schedule.   Transient ischemic attack, reporting symptoms of slurred speech, involuntary left-sided facial spasms and weakness.  Symptoms resolved.  Neurology consulted, TIA versus partial complex seizure.  Brain imaging negative for acute infarct.  Carotid ultrasound shows right-sided 50 to 60% stenosis without plaque.    3.  Hypertension: hypertensive urgency on admission.  - resumed on losartan , carvedilol , clonidine , diltiazem , doxazosin , isosorbide  mononitrate and spironolactone .  blood pressure severely elevated on admission.     4. Secondary Hyperparathyroidism:  - continue sevelamer   5. Anemia with chronic kidney disease: hemoglobin 10.4. Mircera as outpatient. Holding ESA due to acute ischemic event.        LOS: 8 Tracy Jennings 7/4/202510:11 AM

## 2024-03-16 DIAGNOSIS — G459 Transient cerebral ischemic attack, unspecified: Secondary | ICD-10-CM | POA: Diagnosis not present

## 2024-03-16 LAB — BASIC METABOLIC PANEL WITH GFR
Anion gap: 10 (ref 5–15)
BUN: 23 mg/dL (ref 8–23)
CO2: 31 mmol/L (ref 22–32)
Calcium: 8.6 mg/dL — ABNORMAL LOW (ref 8.9–10.3)
Chloride: 90 mmol/L — ABNORMAL LOW (ref 98–111)
Creatinine, Ser: 5.57 mg/dL — ABNORMAL HIGH (ref 0.44–1.00)
GFR, Estimated: 8 mL/min — ABNORMAL LOW (ref >60)
Glucose, Bld: 125 mg/dL — ABNORMAL HIGH (ref 70–99)
Potassium: 3.8 mmol/L (ref 3.5–5.1)
Sodium: 131 mmol/L — ABNORMAL LOW (ref 135–145)

## 2024-03-16 LAB — GLUCOSE, CAPILLARY: Glucose-Capillary: 126 mg/dL — ABNORMAL HIGH (ref 70–99)

## 2024-03-16 NOTE — Progress Notes (Signed)
 Progress Note   Patient: Tracy Jennings FMW:[id number] DOB: 02/13/1956 DOA: 03/05/2024     9 DOS: the patient was seen and examined on 03/16/2024   Brief hospital course: CHARRISSE MASLEY is a 68 year old female with ESRD on HD MWF, HTN, HLD, DM, PVD, dementia, chronic recurrent pancreatitis, stroke, cervical cancer, HIT, who presents with slurred speech, left facial spasm, left-sided weakness.  Patient had syncopal episode on 6/23 and was seen in the ED.  At that time she was found to have O2 desaturation to 80% due to pulmonary edema on chest x-ray as well as hyperkalemia with a potassium of 6.2.  She had negative head CT during that admission.  She received hemodialysis and then was discharged home.  Her last known normal was 6/24 at 1700.  Upon arrival to the ED patient's confusion had resolved entirely.  Labs in the ED mostly unremarkable, potassium 4.6, creatinine consistent with ESRD.  Blood pressure was found to be 252/79.  Patient was placed in telemetry bed for observation.  Code stroke was called.Blood pressure resolved.  Head CT was without acute intracranial abnormality.  Chronic infarct seen on right parietal lobe and extensive chronic microvascular ischemic changes with remote lacunar infarcts in the right caudate and right thalamus appreciated.  Patient was admitted for workup of presumed TIA.  LDL 96, hemoglobin A1c 5.1%.  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  MRI brain revealed chronic encephalomalacia within the right parietal lobe and advanced diffuse cerebral white matter disease.  Patient underwent carotid Dopplers which revealed elevated velocity within the distal right internal carotid artery suggesting stenosis of 50 to 69% though no significant plaque identified and thus this may be artifactual. Patient underwent hemodialysis on 6/25 but left her session early AMA.she attempted to leave the hospital AMA but during this event began hallucinating and  loss capacity.  Psychiatry was consulted and met with the patient on 6/26 and confirms acute delirium, currently the patient does not have capacity to make medical decisions.  She has been started on Seroquel  for presumed Lewy body dementia.  She has had persistent agitation and Seroquel  has been slowly increased. She is currently medically stable and undergoing routine HD while awaiting safe dispo. TOC is consulted.   BP is labile and high.  Family voiced concern about isosorbide  mononitrate.  Nephrology discontinued isosorbide  and possibly add some other medications.  Patient is currently on losartan , Coreg , clonidine , diltiazem , doxazosin  and the spironolactone .   Assessment and Plan:   Dementia with behavioral disturbance - Remote history of dementia. No outpatient work up.  Presumed Lewy body dementia at this time - Mentation and capacity continue to change regularly - Psychiatry has been consulted - Patient is currently on Seroquel - increased last on 6/29. Has been adherent recently.  - As needed Haldol .  - Please see psychiatry notes for more details, appreciate further psychiatry recommendations.- Presently patient does not have capacity to make medical decisions or leave AMA.  We have significant concerns about her ability to take care of herself at home.  She will likely require placement for safety   Hypertensive urgency - Systolic blood pressure severely elevated above 200. - Resume all home meds (patient listed on amlodipine  but also has allergy.  On alternative CCB.) - Continue daily blood pressure titration.  Much improved when she takes all medications.  Medication adherence currently limited by her underlying dementia. - Patient is currently on 5 medications for blood pressure.  Nephrology on board.  Isosorbide  mononitrate has been discontinued due to family's concern.  Nephrologist to add and manage blood pressure.   TIA rule out- MRA: Moderate stenosis of proximal basilar  artery at 50% - MRI brain: Chronic encephalomalacia with the right parietal lobe and advanced diffuse cerebral white matter disease - Carotid Dopplers: Elevated velocity within the distal right internal carotid artery suggesting stenosis of 50 to 69% though no significant plaque identified and thus may be artifactual. - EEG: Cortical dysfunction arising from right temporal parietal region secondary to underlying structural abnormality.  No seizures or epileptiform discharges. - Echocardiogram: EF 55 to 60%, no regional wall motion abnormalities, severe left ventricular hypertrophy, grade 2 diastolic dysfunction elevated pulmonary arterial systolic pressures.  Calcification of aortic valve - Continue statin, continue aspirin . - No events on telemetry.  Have discontinued further monitoring - Lipids and A1c at goal. - Neurology consulted.  Believes patient may have Lewy body dementia as above   Pulmonary arterial hypertension Grade 2 diastolic dysfunction - As seen on echocardiogram - Continue GDMT - Tight blood pressure control as able - Remains clinically euvolemic. Volume to be removed via dialysis if needed   ESRD Electrolyte disturbance - On HD MWF - Nephrology consulted during this admission, continue with dialysis per renal   Elevated troponin - No chest pain, no shortness of breath.  Troponin 41 -> 43. likely secondary to demand ischemia and ESRD - Continue aspirin  and Lipitor   Type 2 diabetes with renal manifestations - Recent hemoglobin A1c 5.1%, well-controlled.  Not currently taking medications - Daily CBG, currently at goal   Anemia of chronic disease, ESRD - Trend CBC. - Hemoglobin currently stable   Hyperlipidemia - Statin   Heparin -induced thrombocytopenia - Avoid heparin  or Lovenox  products  - SCDs as needed     Subjective: Patient was sleeping this morning did not have any concern.  Physical Exam: Vitals:   03/16/24 0400 03/16/24 0620 03/16/24 0802  03/16/24 1016  BP: (!) 203/61 (!) 157/67 (!) 185/52 (!) 173/69  Pulse: 66 63 63 63  Resp: 18 18 16    Temp: 98.3 F (36.8 C) 98.6 F (37 C) 98 F (36.7 C)   TempSrc:      SpO2: 95% 95% 100% 100%  Weight:      Height:       Constitutional: Alert, awake, calm, comfortable HEENT: Neck supple Respiratory: clear to auscultation bilaterally, no wheezing, no crackles. Normal respiratory effort. No accessory muscle use.  Cardiovascular: Regular rate and rhythm, no murmurs / rubs / gallops. No extremity edema. 2+ pedal pulses. No carotid bruits.  Abdomen: no tenderness, no masses palpated. No hepatosplenomegaly. Bowel sounds positive.  Musculoskeletal: no clubbing / cyanosis. No joint deformity upper and lower extremities. Good ROM, no contractures. Normal muscle tone.  Skin: no rashes, lesions, ulcers. No induration Neurologic: CN 2-12 grossly intact. Sensation intact, DTR normal. Strength 5/5 x all 4 extremities.  Psychiatric: Normal judgment and insight. Alert and oriented x 3. Normal mood.       Data Reviewed:  There are no new results to review at this time.  Family Communication: Brother at bedside  Disposition: Status is: Inpatient Remains inpatient appropriate because: Placement and BP control  Planned Discharge Destination: Skilled nursing facility vs Home     Time spent: 35 minutes  Author: Nena Rebel, MD 03/16/2024 2:39 PM  For on call review [email or web address].

## 2024-03-16 NOTE — Plan of Care (Signed)
 The patient shows no s/s of acute distress. The patient is confused, thinks she is at home.

## 2024-03-16 NOTE — Progress Notes (Signed)
 The patient's BP elevated 203/61. Hydralazine  10mg  IV X one dose given. Erminio Cone, Hospitalist was notified via secure chat. No new orders at present.

## 2024-03-16 NOTE — Progress Notes (Signed)
 [facility]  ROUNDING NOTE   Subjective:   Tracy Jennings is a 68 year old female with past medical conditions including dementia, hypertension, diabetes, hyperlipidemia, cervical cancer, recurrent pancreatitis, PVD, and end-stage renal disease on hemodialysis.  Patient presents to the emergency department after experiencing slurred speech, left-sided facial spasms and weakness.  Patient has been admitted on 03/05/2024 for TIA (transient ischemic attack) [G45.9] Hallucination [R44.3]  Patient is known to our practice and receives outpatient dialysis treatments at [facility] on a MWF schedule, supervised by Dr. Dennise.   Family at bedside. Family expresses concern that patient's confusion and headaches are worse with the isosorbide  mononitrate and are asking to have this discontinued.     Objective:  Vital signs in last 24 hours:  Temp:  [98 F (36.7 C)-98.6 F (37 C)] 98 F (36.7 C) (07/05 0802) Pulse Rate:  [63-70] 63 (07/05 1016) Resp:  [16-18] 16 (07/05 0802) BP: (157-206)/(52-79) 173/69 (07/05 1016) SpO2:  [92 %-100 %] 100 % (07/05 1016)  Weight change:  Filed Weights   03/13/24 0731 03/13/24 1100 03/15/24 0750  Weight: 53 kg 52 kg 52 kg    Intake/Output: I/O last 3 completed shifts: In: -  Out: 1500 [Other:1500]   Intake/Output this shift:  No intake/output data recorded.  Physical Exam: General: NAD, laying in bed  Head: Normocephalic, atraumatic. Moist oral mucosal membranes  Eyes: Anicteric  Neck: Supple  Lungs:  Clear to auscultation, normal effort  Heart: Regular rate and rhythm  Abdomen:  Soft, nontender  Extremities: No peripheral edema.  Neurologic: Alert to self, place and time.   Skin: Warm,dry, no rash  Access: Left aVF    Basic Metabolic Panel: Recent Labs  Lab 03/13/24 0745 03/15/24 0800  NA 134* 131*  K 4.6 4.3  CL 94* 90*  CO2 27 27  GLUCOSE 95 104*  BUN 49* 41*  CREATININE 8.52* 7.95*  CALCIUM  9.5 9.0   PHOS 5.6* 5.2*    Liver Function Tests: Recent Labs  Lab 03/13/24 0745 03/15/24 0800  ALBUMIN 3.4* 3.4*   No results for input(s): LIPASE, AMYLASE in the last 168 hours. No results for input(s): AMMONIA in the last 168 hours.  CBC: Recent Labs  Lab 03/13/24 0745 03/15/24 0417  WBC 4.4 4.2  HGB 10.1* 10.4*  HCT 31.7* 32.0*  MCV 94.1 92.5  PLT 189 192    Cardiac Enzymes: No results for input(s): CKTOTAL, CKMB, CKMBINDEX, TROPONINI in the last 168 hours.  BNP: Invalid input(s): POCBNP  CBG: Recent Labs  Lab 03/12/24 0742 03/14/24 0802 03/14/24 1113 03/14/24 1629 03/16/24 0804  GLUCAP 118* 76 102* 130* 126*    Microbiology: Results for orders placed or performed during the hospital encounter of 09/14/22  Resp panel by RT-PCR (RSV, Flu A&B, Covid) Anterior Nasal Swab     Status: None   Collection Time: 09/14/22  2:45 AM   Specimen: Anterior Nasal Swab  Result Value Ref Range Status   SARS Coronavirus 2 by RT PCR NEGATIVE NEGATIVE Final    Comment: (NOTE) SARS-CoV-2 target nucleic acids are NOT DETECTED.  The SARS-CoV-2 RNA is generally detectable in upper respiratory specimens during the acute phase of infection. The lowest concentration of SARS-CoV-2 viral copies this assay can detect is 138 copies/mL. A negative result does not preclude SARS-Cov-2 infection and should not be used as the sole basis for treatment or other patient management decisions. A negative result may occur with  improper specimen collection/handling, submission of specimen other than  nasopharyngeal swab, presence of viral mutation(s) within the areas targeted by this assay, and inadequate number of viral copies(<138 copies/mL). A negative result must be combined with clinical observations, patient history, and epidemiological information. The expected result is Negative.  Fact Sheet for Patients:  BloggerCourse.com  Fact Sheet for  Healthcare Providers:  SeriousBroker.it  This test is no t yet approved or cleared by the [place]  FDA and  has been authorized for detection and/or diagnosis of SARS-CoV-2 by FDA under an Emergency Use Authorization (EUA). This EUA will remain  in effect (meaning this test can be used) for the duration of the COVID-19 declaration under Section 564(b)(1) of the Act, 21 U.S.C.section 360bbb-3(b)(1), unless the authorization is terminated  or revoked sooner.       Influenza A by PCR NEGATIVE NEGATIVE Final   Influenza B by PCR NEGATIVE NEGATIVE Final    Comment: (NOTE) The Xpert Xpress SARS-CoV-2/FLU/RSV plus assay is intended as an aid in the diagnosis of influenza from Nasopharyngeal swab specimens and should not be used as a sole basis for treatment. Nasal washings and aspirates are unacceptable for Xpert Xpress SARS-CoV-2/FLU/RSV testing.  Fact Sheet for Patients: BloggerCourse.com  Fact Sheet for Healthcare Providers: SeriousBroker.it  This test is not yet approved or cleared by the [place]  FDA and has been authorized for detection and/or diagnosis of SARS-CoV-2 by FDA under an Emergency Use Authorization (EUA). This EUA will remain in effect (meaning this test can be used) for the duration of the COVID-19 declaration under Section 564(b)(1) of the Act, 21 U.S.C. section 360bbb-3(b)(1), unless the authorization is terminated or revoked.     Resp Syncytial Virus by PCR NEGATIVE NEGATIVE Final    Comment: (NOTE) Fact Sheet for Patients: BloggerCourse.com  Fact Sheet for Healthcare Providers: SeriousBroker.it  This test is not yet approved or cleared by the [place]  FDA and has been authorized for detection and/or diagnosis of SARS-CoV-2 by FDA under an Emergency Use Authorization (EUA). This EUA will remain in effect (meaning this  test can be used) for the duration of the COVID-19 declaration under Section 564(b)(1) of the Act, 21 U.S.C. section 360bbb-3(b)(1), unless the authorization is terminated or revoked.  Performed at [facility], [address] [place], [place] [address]     Coagulation Studies: No results for input(s): LABPROT, INR in the last 72 hours.   Urinalysis: No results for input(s): COLORURINE, LABSPEC, PHURINE, GLUCOSEU, HGBUR, BILIRUBINUR, KETONESUR, PROTEINUR, UROBILINOGEN, NITRITE, LEUKOCYTESUR in the last 72 hours.  Invalid input(s): APPERANCEUR    Imaging: No results found.    Medications:     aspirin   300 mg Rectal Daily   Or   aspirin   325 mg Oral Daily   atorvastatin   40 mg Oral Daily   carvedilol   25 mg Oral BID WC   Chlorhexidine  Gluconate Cloth  6 each Topical Daily   cloNIDine   0.3 mg Oral TID   diltiazem   120 mg Oral Daily   doxazosin   4 mg Oral Daily   losartan   100 mg Oral Daily   QUEtiapine   25 mg Oral TID   QUEtiapine   50 mg Oral Daily   sevelamer  carbonate  1,600 mg Oral TID WC   sodium chloride  flush  10-40 mL Intracatheter Q12H   spironolactone   25 mg Oral Daily   acetaminophen  **OR** acetaminophen  (TYLENOL ) oral liquid 160 mg/5 mL **OR** acetaminophen , haloperidol  lactate, LORazepam , ondansetron  (ZOFRAN ) IV, QUEtiapine , sodium chloride  flush, sodium chloride  flush, traZODone   Assessment/ Plan:  Ms. Metha  D Myer is a 68 y.o.  female with past medical conditions including end stage renal disease on hemodialysis, dementia, hypertension, diabetes, hyperlipidemia, cervical cancer, recurrent pancreatitis, PVD, and end-stage renal disease on hemodialysis.  Patient presents to the emergency department after experiencing slurred speech, left-sided facial spasms and weakness.  Patient has been admitted for TIA (transient ischemic attack) [G45.9] Hallucination [R44.3]  [facility]/MWF/Lt AVG    End-stage  renal disease on hemodialysis. - Dialysis for MWF schedule.   Transient ischemic attack, reporting symptoms of slurred speech, involuntary left-sided facial spasms and weakness.  Symptoms resolved.  Neurology consulted, TIA versus partial complex seizure.  Brain imaging negative for acute infarct.  Carotid ultrasound shows right-sided 50 to 60% stenosis without plaque.    3.  Hypertension: hypertensive urgency on admission.  - continue losartan , carvedilol , clonidine , diltiazem , doxazosin , and spironolactone .  blood pressure severely elevated on admission.    - discontinue isosorbide  mononitrate due to family concerns.   4. Secondary Hyperparathyroidism:  - continue sevelamer   5. Anemia with chronic kidney disease: hemoglobin 10.4. Mircera as outpatient. Holding ESA due to acute ischemic event.        LOS: 9 Ladarian Bonczek 7/5/202512:40 PM

## 2024-03-16 NOTE — Plan of Care (Signed)
   Problem: Ischemic Stroke/TIA Tissue Perfusion: Goal: Complications of ischemic stroke/TIA will be minimized Outcome: Progressing
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
## 2024-03-17 DIAGNOSIS — I160 Hypertensive urgency: Secondary | ICD-10-CM

## 2024-03-17 DIAGNOSIS — G9341 Metabolic encephalopathy: Secondary | ICD-10-CM | POA: Diagnosis not present

## 2024-03-17 DIAGNOSIS — F03918 Unspecified dementia, unspecified severity, with other behavioral disturbance: Secondary | ICD-10-CM

## 2024-03-17 DIAGNOSIS — G459 Transient cerebral ischemic attack, unspecified: Secondary | ICD-10-CM | POA: Diagnosis not present

## 2024-03-17 DIAGNOSIS — N186 End stage renal disease: Secondary | ICD-10-CM | POA: Diagnosis not present

## 2024-03-17 DIAGNOSIS — E871 Hypo-osmolality and hyponatremia: Secondary | ICD-10-CM

## 2024-03-17 LAB — CBC
HCT: 33.8 % — ABNORMAL LOW (ref 36.0–46.0)
Hemoglobin: 10.8 g/dL — ABNORMAL LOW (ref 12.0–15.0)
MCH: 29.6 pg (ref 26.0–34.0)
MCHC: 32 g/dL (ref 30.0–36.0)
MCV: 92.6 fL (ref 80.0–100.0)
Platelets: 188 K/uL (ref 150–400)
RBC: 3.65 MIL/uL — ABNORMAL LOW (ref 3.87–5.11)
RDW: 14 % (ref 11.5–15.5)
WBC: 5.2 K/uL (ref 4.0–10.5)
nRBC: 0 % (ref 0.0–0.2)

## 2024-03-17 LAB — GLUCOSE, CAPILLARY: Glucose-Capillary: 146 mg/dL — ABNORMAL HIGH (ref 70–99)

## 2024-03-17 MED ORDER — NEPRO/CARBSTEADY PO LIQD
237.0000 mL | Freq: Three times a day (TID) | ORAL | Status: DC
  Administered 2024-03-17 – 2024-03-26 (×13): 237 mL via ORAL

## 2024-03-17 MED ORDER — CLONIDINE HCL 0.1 MG PO TABS
0.3000 mg | ORAL_TABLET | ORAL | Status: AC
  Administered 2024-03-17: 0.3 mg via ORAL

## 2024-03-17 MED ORDER — CLONIDINE HCL 0.1 MG PO TABS: 0.1000 mg | ORAL_TABLET | Freq: Two times a day (BID) | ORAL | Status: DC

## 2024-03-17 MED ORDER — CLONIDINE HCL 0.1 MG PO TABS
0.2000 mg | ORAL_TABLET | Freq: Two times a day (BID) | ORAL | Status: AC
  Administered 2024-03-17 – 2024-03-18 (×2): 0.2 mg via ORAL
  Filled 2024-03-17 (×2): qty 2

## 2024-03-17 MED ORDER — LABETALOL HCL 5 MG/ML IV SOLN
20.0000 mg | INTRAVENOUS | Status: DC | PRN
  Administered 2024-03-17 – 2024-03-21 (×5): 20 mg via INTRAVENOUS
  Filled 2024-03-17 (×5): qty 4

## 2024-03-17 MED ORDER — CLONIDINE HCL 0.3 MG/24HR TD PTWK
0.3000 mg | MEDICATED_PATCH | TRANSDERMAL | Status: DC
  Administered 2024-03-17 – 2024-03-31 (×3): 0.3 mg via TRANSDERMAL
  Filled 2024-03-17 (×3): qty 1

## 2024-03-17 NOTE — Assessment & Plan Note (Signed)
Dialysis to manage

## 2024-03-17 NOTE — Plan of Care (Signed)
   Problem: Ischemic Stroke/TIA Tissue Perfusion: Goal: Complications of ischemic stroke/TIA will be minimized Outcome: Progressing

## 2024-03-17 NOTE — Assessment & Plan Note (Signed)
 Family thinks it may be secondary to medication once try to limit medication.  Imdur  discontinued, clonidine  pills changed over to patch.

## 2024-03-17 NOTE — Assessment & Plan Note (Signed)
 Patient on Seroquel  and as needed Haldol .  Patient seen by psychiatry does not have the capacity to leave AMA.  Family would like to take her home rather than going out to rehab.

## 2024-03-17 NOTE — Progress Notes (Signed)
 Progress Note   Patient: Tracy Jennings FMW:[id number] DOB: 12-Jun-1956 DOA: 03/05/2024     10 DOS: the patient was seen and examined on 03/17/2024   Brief hospital course: Tracy Jennings is a 68 year old female with ESRD on HD MWF, HTN, HLD, DM, PVD, dementia, chronic recurrent pancreatitis, stroke, cervical cancer, HIT, who presents with slurred speech, left facial spasm, left-sided weakness.  Patient had syncopal episode on 6/23 and was seen in the ED.  At that time she was found to have O2 desaturation to 80% due to pulmonary edema on chest x-ray as well as hyperkalemia with a potassium of 6.2.  She had negative head CT during that admission.  She received hemodialysis and then was discharged home.  Her last known normal was 6/24 at 1700.  Upon arrival to the ED patient's confusion had resolved entirely.  Labs in the ED mostly unremarkable, potassium 4.6, creatinine consistent with ESRD.  Blood pressure was found to be 252/79.  Patient was placed in telemetry bed for observation.  Code stroke was called.Blood pressure resolved.  Head CT was without acute intracranial abnormality.  Chronic infarct seen on right parietal lobe and extensive chronic microvascular ischemic changes with remote lacunar infarcts in the right caudate and right thalamus appreciated.  Patient was admitted for workup of presumed TIA.  LDL 96, hemoglobin A1c 5.1%.  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  MRI brain revealed chronic encephalomalacia within the right parietal lobe and advanced diffuse cerebral white matter disease.  Patient underwent carotid Dopplers which revealed elevated velocity within the distal right internal carotid artery suggesting stenosis of 50 to 69% though no significant plaque identified and thus this may be artifactual. Patient underwent hemodialysis on 6/25 but left her session early AMA.she attempted to leave the hospital AMA but during this event began hallucinating and  loss capacity.  Psychiatry was consulted and met with the patient on 6/26 and confirms acute delirium, currently the patient does not have capacity to make medical decisions.  She has been started on Seroquel  for presumed Lewy body dementia.  She has had persistent agitation and Seroquel  has been slowly increased. She is currently medically stable and undergoing routine HD while awaiting safe dispo. TOC is consulted.   BP is labile and high.  Family voiced concern about isosorbide  mononitrate.  Nephrology discontinued isosorbide  and possibly add some other medications.  Patient is currently on losartan , Coreg , clonidine , diltiazem , doxazosin  and the spironolactone .   Assessment and Plan: * Hypertensive urgency Nephrology to try to change clonidine  pills over to clonidine  patch.  Continue Coreg , Cardizem  CD, Cardura , losartan , spironolactone   TIA (transient ischemic attack) Ruled out.  MRI of the brain shows chronic encephalomalacia within the right parietal lobe and advanced diffuse cerebral white matter disease, carotid Doppler showing elevated velocity in the distal right internal carotid suggesting stenosis of 50 to 69%, EEG showing cortical dysfunction arising from the right temporal parietal region secondary to underlying structural abnormality no seizure or left to form discharges, EF normal range.  Acute metabolic encephalopathy Family thinks it may be secondary to medication once try to limit medication.  Imdur  discontinued yesterday.  Dementia with behavioral disturbance (HCC) Patient on Seroquel  and as needed Haldol .  Patient seen by psychiatry does not have the capacity to leave AMA.  Family would like to take her home rather than going out to rehab.  ESRD (end stage renal disease) (HCC) Continue dialysis Monday Wednesday Friday  Type II diabetes mellitus  with renal manifestations (HCC) Last hemoglobin A1c 5.1  Anemia in ESRD (end-stage renal disease) (HCC) Last hemoglobin  10.8  Hyperlipidemia Continue Lipitor  Hyponatremia Dialysis to manage        Subjective: Patient was sleeping initially but then woke up and was able to answer some questions.  Patient's blood pressure still on the higher side.  Nephrology discontinued Imdur .  Nephrology changing clonidine  pills over to clonidine  patch.  Physical Exam: Vitals:   03/16/24 2007 03/17/24 0453 03/17/24 0610 03/17/24 0744  BP: (!) 184/66 (!) 200/67 (!) 149/78 (!) 197/62  Pulse: 67 67 65 64  Resp: 16 20 18 16   Temp: 97.8 F (36.6 C)  99.9 F (37.7 C) 97.8 F (36.6 C)  TempSrc: Axillary     SpO2: 95% 94% 97% 98%  Weight:      Height:       Physical Exam HENT:     Head: Normocephalic.     Mouth/Throat:     Pharynx: No oropharyngeal exudate.  Eyes:     General: Lids are normal.     Conjunctiva/sclera: Conjunctivae normal.  Cardiovascular:     Rate and Rhythm: Normal rate and regular rhythm.     Heart sounds: S1 normal and S2 normal. Murmur heard.     Systolic murmur is present with a grade of 3/6.  Pulmonary:     Breath sounds: No decreased breath sounds, wheezing, rhonchi or rales.  Abdominal:     Palpations: Abdomen is soft.     Tenderness: There is no abdominal tenderness.  Musculoskeletal:     Right lower leg: No swelling.     Left lower leg: No swelling.  Skin:    General: Skin is warm.     Findings: No rash.  Neurological:     Mental Status: She is alert.     Comments: Answering questions.  Moves all extremities     Data Reviewed: Sodium 131, potassium 3.8, creatinine 5.57, white blood cell count  5.2, hemoglobin 10.8, platelet count 188.  Family Communication: Sister at bedside  Disposition: Status is: Inpatient Remains inpatient appropriate because: Blood pressure still elevated working on changing clonidine  pills over to clonidine  patch.  Planned Discharge Destination: Home with Home Health    Time spent: 28 minutes  Author: Charlie Patterson, MD 03/17/2024 2:11  PM  For on call review [email or web address].

## 2024-03-17 NOTE — Assessment & Plan Note (Signed)
 Nephrology to try to change clonidine  pills over to clonidine  patch.  Continue Coreg , Cardizem  CD, Cardura , losartan , spironolactone 

## 2024-03-17 NOTE — Assessment & Plan Note (Signed)
 Ruled out.  MRI of the brain shows chronic encephalomalacia within the right parietal lobe and advanced diffuse cerebral white matter disease, carotid Doppler showing elevated velocity in the distal right internal carotid suggesting stenosis of 50 to 69%, EEG showing cortical dysfunction arising from the right temporal parietal region secondary to underlying structural abnormality no seizure or left to form discharges, EF normal range.

## 2024-03-17 NOTE — Assessment & Plan Note (Signed)
 Continue dialysis Monday Wednesday Friday

## 2024-03-17 NOTE — Progress Notes (Signed)
 MEWS Progress Note  Patient Details Name: Tracy Jennings MRN: [id number] DOB: 1956-07-06 Today's Date: 03/17/2024   MEWS Flowsheet Documentation:  Assess: MEWS Score Temp: 99.1 F (37.3 C) BP: (!) 232/74 MAP (mmHg): 111 Pulse Rate: 72 ECG Heart Rate: 63 Resp: 16 Level of Consciousness: Alert SpO2: 97 % O2 Device: Room Air Patient Activity (if Appropriate): In bed Assess: MEWS Score MEWS Temp: 0 MEWS Systolic: 2 MEWS Pulse: 0 MEWS RR: 0 MEWS LOC: 0 MEWS Score: 2 MEWS Score Color: Yellow Assess: SIRS CRITERIA SIRS Temperature : 0 SIRS Respirations : 0 SIRS Pulse: 0 SIRS WBC: 0 SIRS Score Sum : 0 SIRS Temperature : 0 SIRS Pulse: 0 SIRS Respirations : 0 SIRS WBC: 0 SIRS Score Sum : 0 Assess: if the MEWS score is Yellow or Red Were vital signs accurate and taken at a resting state?: Yes Does the patient meet 2 or more of the SIRS criteria?: No MEWS guidelines implemented : Yes, yellow Treat MEWS Interventions: Considered administering scheduled or prn medications/treatments as ordered Take Vital Signs Increase Vital Sign Frequency : Yellow: Q2hr x1, continue Q4hrs until patient remains green for 12hrs Escalate MEWS: Escalate: Yellow: Discuss with charge nurse and consider notifying provider and/or RRT Notify: Charge Nurse/RN Name of Charge Nurse/RN Notified: Surveyor, quantity Provider Notification Provider Name/Title: Lawence MD Date Provider Notified: 03/17/24 Time Provider Notified: 2325 Method of Notification: Page (secure chat) Notification Reason: Other (Comment) (BP elevated 219/71 on monitor, manually 232/74. Pt c/o HA, dizziness and genrally not feeling well. primary MD started Clonidine  0.3 patch and 0.2 BID oral.)   Tracy Jennings 03/17/2024, 11:29 PM

## 2024-03-17 NOTE — Plan of Care (Signed)
 The patient did not take her bedtime medications due to she was too sleepy/lethargic to wake up to take them. The patient woke up at 0300am c/o being hungry. This Clinical research associate gave the patient graham crackers.

## 2024-03-17 NOTE — Progress Notes (Signed)
 [facility]  ROUNDING NOTE   Subjective:   Tracy Jennings is a 68 year old female with past medical conditions including dementia, hypertension, diabetes, hyperlipidemia, cervical cancer, recurrent pancreatitis, PVD, and end-stage renal disease on hemodialysis.  Patient presents to the emergency department after experiencing slurred speech, left-sided facial spasms and weakness.  Patient has been admitted on 03/05/2024 for TIA (transient ischemic attack) [G45.9] Hallucination [R44.3]  Patient is known to our practice and receives outpatient dialysis treatments at [facility] on a MWF schedule, supervised by Dr. Dennise.   Sister at bedside. Patient sleeping during assessment.     Objective:  Vital signs in last 24 hours:  Temp:  [97.8 F (36.6 C)-99.9 F (37.7 C)] 97.8 F (36.6 C) (07/06 0744) Pulse Rate:  [63-67] 64 (07/06 0744) Resp:  [16-20] 16 (07/06 0744) BP: (149-200)/(56-78) 197/62 (07/06 0744) SpO2:  [94 %-98 %] 98 % (07/06 0744)  Weight change:  Filed Weights   03/13/24 0731 03/13/24 1100 03/15/24 0750  Weight: 53 kg 52 kg 52 kg    Intake/Output: I/O last 3 completed shifts: In: 240 [P.O.:240] Out: -    Intake/Output this shift:  No intake/output data recorded.  Physical Exam: General: NAD, laying in bed  Head: Normocephalic, atraumatic. Moist oral mucosal membranes  Eyes: Anicteric  Neck: Supple  Lungs:  Clear to auscultation, normal effort  Heart: Regular rate and rhythm  Abdomen:  Soft, nontender  Extremities: No peripheral edema.  Neurologic: sleeping  Skin: Warm,dry, no rash  Access: Left aVF    Basic Metabolic Panel: Recent Labs  Lab 03/13/24 0745 03/15/24 0800 03/16/24 1515  NA 134* 131* 131*  K 4.6 4.3 3.8  CL 94* 90* 90*  CO2 27 27 31   GLUCOSE 95 104* 125*  BUN 49* 41* 23  CREATININE 8.52* 7.95* 5.57*  CALCIUM  9.5 9.0 8.6*  PHOS 5.6* 5.2*  --     Liver Function Tests: Recent Labs  Lab 03/13/24 0745  03/15/24 0800  ALBUMIN 3.4* 3.4*   No results for input(s): LIPASE, AMYLASE in the last 168 hours. No results for input(s): AMMONIA in the last 168 hours.  CBC: Recent Labs  Lab 03/13/24 0745 03/15/24 0417 03/17/24 0457  WBC 4.4 4.2 5.2  HGB 10.1* 10.4* 10.8*  HCT 31.7* 32.0* 33.8*  MCV 94.1 92.5 92.6  PLT 189 192 188    Cardiac Enzymes: No results for input(s): CKTOTAL, CKMB, CKMBINDEX, TROPONINI in the last 168 hours.  BNP: Invalid input(s): POCBNP  CBG: Recent Labs  Lab 03/14/24 0802 03/14/24 1113 03/14/24 1629 03/16/24 0804 03/17/24 0745  GLUCAP 76 102* 130* 126* 146*    Microbiology: Results for orders placed or performed during the hospital encounter of 09/14/22  Resp panel by RT-PCR (RSV, Flu A&B, Covid) Anterior Nasal Swab     Status: None   Collection Time: 09/14/22  2:45 AM   Specimen: Anterior Nasal Swab  Result Value Ref Range Status   SARS Coronavirus 2 by RT PCR NEGATIVE NEGATIVE Final    Comment: (NOTE) SARS-CoV-2 target nucleic acids are NOT DETECTED.  The SARS-CoV-2 RNA is generally detectable in upper respiratory specimens during the acute phase of infection. The lowest concentration of SARS-CoV-2 viral copies this assay can detect is 138 copies/mL. A negative result does not preclude SARS-Cov-2 infection and should not be used as the sole basis for treatment or other patient management decisions. A negative result may occur with  improper specimen collection/handling, submission of specimen other than nasopharyngeal swab, presence  of viral mutation(s) within the areas targeted by this assay, and inadequate number of viral copies(<138 copies/mL). A negative result must be combined with clinical observations, patient history, and epidemiological information. The expected result is Negative.  Fact Sheet for Patients:  BloggerCourse.com  Fact Sheet for Healthcare Providers:   SeriousBroker.it  This test is no t yet approved or cleared by the [place]  FDA and  has been authorized for detection and/or diagnosis of SARS-CoV-2 by FDA under an Emergency Use Authorization (EUA). This EUA will remain  in effect (meaning this test can be used) for the duration of the COVID-19 declaration under Section 564(b)(1) of the Act, 21 U.S.C.section 360bbb-3(b)(1), unless the authorization is terminated  or revoked sooner.       Influenza A by PCR NEGATIVE NEGATIVE Final   Influenza B by PCR NEGATIVE NEGATIVE Final    Comment: (NOTE) The Xpert Xpress SARS-CoV-2/FLU/RSV plus assay is intended as an aid in the diagnosis of influenza from Nasopharyngeal swab specimens and should not be used as a sole basis for treatment. Nasal washings and aspirates are unacceptable for Xpert Xpress SARS-CoV-2/FLU/RSV testing.  Fact Sheet for Patients: BloggerCourse.com  Fact Sheet for Healthcare Providers: SeriousBroker.it  This test is not yet approved or cleared by the [place]  FDA and has been authorized for detection and/or diagnosis of SARS-CoV-2 by FDA under an Emergency Use Authorization (EUA). This EUA will remain in effect (meaning this test can be used) for the duration of the COVID-19 declaration under Section 564(b)(1) of the Act, 21 U.S.C. section 360bbb-3(b)(1), unless the authorization is terminated or revoked.     Resp Syncytial Virus by PCR NEGATIVE NEGATIVE Final    Comment: (NOTE) Fact Sheet for Patients: BloggerCourse.com  Fact Sheet for Healthcare Providers: SeriousBroker.it  This test is not yet approved or cleared by the [place]  FDA and has been authorized for detection and/or diagnosis of SARS-CoV-2 by FDA under an Emergency Use Authorization (EUA). This EUA will remain in effect (meaning this test can be used) for  the duration of the COVID-19 declaration under Section 564(b)(1) of the Act, 21 U.S.C. section 360bbb-3(b)(1), unless the authorization is terminated or revoked.  Performed at [facility], [address] [place], [place] [address]     Coagulation Studies: No results for input(s): LABPROT, INR in the last 72 hours.   Urinalysis: No results for input(s): COLORURINE, LABSPEC, PHURINE, GLUCOSEU, HGBUR, BILIRUBINUR, KETONESUR, PROTEINUR, UROBILINOGEN, NITRITE, LEUKOCYTESUR in the last 72 hours.  Invalid input(s): APPERANCEUR    Imaging: No results found.    Medications:     aspirin   300 mg Rectal Daily   Or   aspirin   325 mg Oral Daily   atorvastatin   40 mg Oral Daily   carvedilol   25 mg Oral BID WC   Chlorhexidine  Gluconate Cloth  6 each Topical Daily   cloNIDine   0.3 mg Oral TID   diltiazem   120 mg Oral Daily   doxazosin   4 mg Oral Daily   feeding supplement (NEPRO CARB STEADY)  237 mL Oral TID BM   losartan   100 mg Oral Daily   QUEtiapine   25 mg Oral TID   QUEtiapine   50 mg Oral Daily   sevelamer  carbonate  1,600 mg Oral TID WC   sodium chloride  flush  10-40 mL Intracatheter Q12H   spironolactone   25 mg Oral Daily   acetaminophen  **OR** acetaminophen  (TYLENOL ) oral liquid 160 mg/5 mL **OR** acetaminophen , haloperidol  lactate, LORazepam , ondansetron  (ZOFRAN ) IV, QUEtiapine , sodium chloride  flush,  sodium chloride  flush, traZODone   Assessment/ Plan:  [name] is a 68 y.o.  female with past medical conditions including end stage renal disease on hemodialysis, dementia, hypertension, diabetes, hyperlipidemia, cervical cancer, recurrent pancreatitis, PVD, and end-stage renal disease on hemodialysis.  Patient presents to the emergency department after experiencing slurred speech, left-sided facial spasms and weakness.  Patient has been admitted for TIA (transient ischemic attack) [G45.9] Hallucination [R44.3]  [facility]  [place]/MWF/Lt AVG    End-stage renal disease on hemodialysis. - Dialysis for MWF schedule. Dialysis for tomorrow.   Transient ischemic attack, reporting symptoms of slurred speech, involuntary left-sided facial spasms and weakness.  Symptoms resolved.  Neurology consulted, TIA versus partial complex seizure.  Brain imaging negative for acute infarct.  Carotid ultrasound shows right-sided 50 to 60% stenosis without plaque.    3.  Hypertension: hypertensive urgency on admission.  - continue losartan , carvedilol , clonidine , diltiazem , doxazosin , and spironolactone .  blood pressure severely elevated on admission.    - discontinued isosorbide  mononitrate due to family concerns.   4. Secondary Hyperparathyroidism:  - continue sevelamer   5. Anemia with chronic kidney disease: hemoglobin 10.8. Mircera as outpatient. Holding ESA due to acute ischemic event.        LOS: 10 Tracy Jennings 7/6/20251:32 PM

## 2024-03-17 NOTE — Assessment & Plan Note (Signed)
 Last hemoglobin 10.8

## 2024-03-17 NOTE — Progress Notes (Signed)
 The patient's BP was elevated at 0500am, 200/67. The patient missed her 2200 dose of clonidine  0.3mg  due to she was too sleepy/lethargic. [facility] Hospitalist was notified, said to administer clonidine  0.3mg  now and recheck BP. Clonidine  was given at 0515am. Vital signs at 0610am, 149/78, 65, 18, 97% on RA.

## 2024-03-17 NOTE — Assessment & Plan Note (Signed)
 Last hemoglobin A1c 5.1

## 2024-03-17 NOTE — Assessment & Plan Note (Signed)
 -  Continue Lipitor

## 2024-03-18 DIAGNOSIS — N186 End stage renal disease: Secondary | ICD-10-CM | POA: Diagnosis not present

## 2024-03-18 DIAGNOSIS — I160 Hypertensive urgency: Secondary | ICD-10-CM | POA: Diagnosis not present

## 2024-03-18 DIAGNOSIS — G9341 Metabolic encephalopathy: Secondary | ICD-10-CM | POA: Diagnosis not present

## 2024-03-18 DIAGNOSIS — G459 Transient cerebral ischemic attack, unspecified: Secondary | ICD-10-CM | POA: Diagnosis not present

## 2024-03-18 DIAGNOSIS — E1129 Type 2 diabetes mellitus with other diabetic kidney complication: Secondary | ICD-10-CM

## 2024-03-18 LAB — GLUCOSE, CAPILLARY
Glucose-Capillary: 146 mg/dL — ABNORMAL HIGH (ref 70–99)
Glucose-Capillary: 113 mg/dL — ABNORMAL HIGH (ref 70–99)

## 2024-03-18 MED ORDER — HYDRALAZINE HCL 50 MG PO TABS
25.0000 mg | ORAL_TABLET | Freq: Four times a day (QID) | ORAL | Status: DC | PRN
  Administered 2024-03-19 (×2): 25 mg via ORAL
  Filled 2024-03-18 (×2): qty 1

## 2024-03-18 MED ORDER — POLYETHYLENE GLYCOL 3350 17 G PO PACK
17.0000 g | PACK | Freq: Every day | ORAL | Status: DC
  Administered 2024-03-18 – 2024-03-29 (×8): 17 g via ORAL
  Filled 2024-03-18 (×13): qty 1

## 2024-03-18 MED ORDER — HYDRALAZINE HCL 20 MG/ML IJ SOLN
10.0000 mg | Freq: Four times a day (QID) | INTRAMUSCULAR | Status: DC | PRN
  Administered 2024-03-18 – 2024-03-25 (×6): 10 mg via INTRAVENOUS
  Filled 2024-03-18 (×4): qty 1
  Filled 2024-03-18: qty 0.5
  Filled 2024-03-18: qty 1

## 2024-03-18 MED ORDER — ACETAMINOPHEN 325 MG PO TABS
ORAL_TABLET | ORAL | Status: AC
  Filled 2024-03-18: qty 2

## 2024-03-18 NOTE — Progress Notes (Signed)
Pt is off unit for Dialysis.

## 2024-03-18 NOTE — Procedures (Signed)
 Received patient in bed to unit.  Alert and oriented.  Informed consent signed and in chart.   TX duration:3hrs and 15 mins.   Patient tolerated well.  Transported back to the room  Alert, without acute distress.  Hand-off given to patient's nurse.   Access used: left upper arm graft.  Access issues: NONE.  Total UF removed: 1.5L Medication(s) given: tylenol  650mg  tab.   [facility]

## 2024-03-18 NOTE — Progress Notes (Signed)
 [facility]  ROUNDING NOTE   Subjective:   Tracy Jennings is a 68 year old female with past medical conditions including dementia, hypertension, diabetes, hyperlipidemia, cervical cancer, recurrent pancreatitis, PVD, and end-stage renal disease on hemodialysis.  Patient presents to the emergency department after experiencing slurred speech, left-sided facial spasms and weakness.  Patient has been admitted on 03/05/2024 for TIA (transient ischemic attack) [G45.9] Hallucination [R44.3]  Patient is known to our practice and receives outpatient dialysis treatments at [facility] on a MWF schedule, supervised by Dr. Dennise.   Update: Patient seen and evaluated during dialysis   HEMODIALYSIS FLOWSHEET:  Blood Flow Rate (mL/min): 400 mL/min Arterial Pressure (mmHg): -175.14 mmHg Venous Pressure (mmHg): 254.94 mmHg TMP (mmHg): -3.23 mmHg Ultrafiltration Rate (mL/min): 707 mL/min Dialysate Flow Rate (mL/min): 299 ml/min Dialysis Fluid Bolus: Normal Saline Bolus Amount (mL): 300 mL  Denies pain Denies shortness of breath   Objective:  Vital signs in last 24 hours:  Temp:  [97.4 F (36.3 C)-99.1 F (37.3 C)] 97.4 F (36.3 C) (07/07 0832) Pulse Rate:  [59-75] 59 (07/07 1100) Resp:  [9-18] 9 (07/07 1100) BP: (97-232)/(50-85) 97/50 (07/07 1130) SpO2:  [94 %-100 %] 99 % (07/07 1100) Weight:  [53.4 kg] 53.4 kg (07/07 0832)  Weight change:  Filed Weights   03/13/24 1100 03/15/24 0750 03/18/24 0832  Weight: 52 kg 52 kg 53.4 kg    Intake/Output: I/O last 3 completed shifts: In: 10 [I.V.:10] Out: -    Intake/Output this shift:  No intake/output data recorded.  Physical Exam: General: NAD, laying in bed  Head: Normocephalic, atraumatic. Moist oral mucosal membranes  Eyes: Anicteric  Neck: Supple  Lungs:  Clear to auscultation, normal effort  Heart: Regular rate and rhythm  Abdomen:  Soft, nontender  Extremities: No peripheral edema.  Neurologic:  Awake, alert  Skin: Warm,dry, no rash  Access: Left aVF    Basic Metabolic Panel: Recent Labs  Lab 03/13/24 0745 03/15/24 0800 03/16/24 1515  NA 134* 131* 131*  K 4.6 4.3 3.8  CL 94* 90* 90*  CO2 27 27 31   GLUCOSE 95 104* 125*  BUN 49* 41* 23  CREATININE 8.52* 7.95* 5.57*  CALCIUM  9.5 9.0 8.6*  PHOS 5.6* 5.2*  --     Liver Function Tests: Recent Labs  Lab 03/13/24 0745 03/15/24 0800  ALBUMIN 3.4* 3.4*   No results for input(s): LIPASE, AMYLASE in the last 168 hours. No results for input(s): AMMONIA in the last 168 hours.  CBC: Recent Labs  Lab 03/13/24 0745 03/15/24 0417 03/17/24 0457  WBC 4.4 4.2 5.2  HGB 10.1* 10.4* 10.8*  HCT 31.7* 32.0* 33.8*  MCV 94.1 92.5 92.6  PLT 189 192 188    Cardiac Enzymes: No results for input(s): CKTOTAL, CKMB, CKMBINDEX, TROPONINI in the last 168 hours.  BNP: Invalid input(s): POCBNP  CBG: Recent Labs  Lab 03/14/24 1113 03/14/24 1629 03/16/24 0804 03/17/24 0745 03/18/24 0726  GLUCAP 102* 130* 126* 146* 146*    Microbiology: Results for orders placed or performed during the hospital encounter of 09/14/22  Resp panel by RT-PCR (RSV, Flu A&B, Covid) Anterior Nasal Swab     Status: None   Collection Time: 09/14/22  2:45 AM   Specimen: Anterior Nasal Swab  Result Value Ref Range Status   SARS Coronavirus 2 by RT PCR NEGATIVE NEGATIVE Final    Comment: (NOTE) SARS-CoV-2 target nucleic acids are NOT DETECTED.  The SARS-CoV-2 RNA is generally detectable in upper respiratory specimens during the  acute phase of infection. The lowest concentration of SARS-CoV-2 viral copies this assay can detect is 138 copies/mL. A negative result does not preclude SARS-Cov-2 infection and should not be used as the sole basis for treatment or other patient management decisions. A negative result may occur with  improper specimen collection/handling, submission of specimen other than nasopharyngeal swab, presence of  viral mutation(s) within the areas targeted by this assay, and inadequate number of viral copies(<138 copies/mL). A negative result must be combined with clinical observations, patient history, and epidemiological information. The expected result is Negative.  Fact Sheet for Patients:  BloggerCourse.com  Fact Sheet for Healthcare Providers:  SeriousBroker.it  This test is no t yet approved or cleared by the [place]  FDA and  has been authorized for detection and/or diagnosis of SARS-CoV-2 by FDA under an Emergency Use Authorization (EUA). This EUA will remain  in effect (meaning this test can be used) for the duration of the COVID-19 declaration under Section 564(b)(1) of the Act, 21 U.S.C.section 360bbb-3(b)(1), unless the authorization is terminated  or revoked sooner.       Influenza A by PCR NEGATIVE NEGATIVE Final   Influenza B by PCR NEGATIVE NEGATIVE Final    Comment: (NOTE) The Xpert Xpress SARS-CoV-2/FLU/RSV plus assay is intended as an aid in the diagnosis of influenza from Nasopharyngeal swab specimens and should not be used as a sole basis for treatment. Nasal washings and aspirates are unacceptable for Xpert Xpress SARS-CoV-2/FLU/RSV testing.  Fact Sheet for Patients: BloggerCourse.com  Fact Sheet for Healthcare Providers: SeriousBroker.it  This test is not yet approved or cleared by the [place]  FDA and has been authorized for detection and/or diagnosis of SARS-CoV-2 by FDA under an Emergency Use Authorization (EUA). This EUA will remain in effect (meaning this test can be used) for the duration of the COVID-19 declaration under Section 564(b)(1) of the Act, 21 U.S.C. section 360bbb-3(b)(1), unless the authorization is terminated or revoked.     Resp Syncytial Virus by PCR NEGATIVE NEGATIVE Final    Comment: (NOTE) Fact Sheet for  Patients: BloggerCourse.com  Fact Sheet for Healthcare Providers: SeriousBroker.it  This test is not yet approved or cleared by the [place]  FDA and has been authorized for detection and/or diagnosis of SARS-CoV-2 by FDA under an Emergency Use Authorization (EUA). This EUA will remain in effect (meaning this test can be used) for the duration of the COVID-19 declaration under Section 564(b)(1) of the Act, 21 U.S.C. section 360bbb-3(b)(1), unless the authorization is terminated or revoked.  Performed at [facility], [address] [place], [place] [address]     Coagulation Studies: No results for input(s): LABPROT, INR in the last 72 hours.   Urinalysis: No results for input(s): COLORURINE, LABSPEC, PHURINE, GLUCOSEU, HGBUR, BILIRUBINUR, KETONESUR, PROTEINUR, UROBILINOGEN, NITRITE, LEUKOCYTESUR in the last 72 hours.  Invalid input(s): APPERANCEUR    Imaging: No results found.    Medications:     aspirin   300 mg Rectal Daily   Or   aspirin   325 mg Oral Daily   atorvastatin   40 mg Oral Daily   carvedilol   25 mg Oral BID WC   Chlorhexidine  Gluconate Cloth  6 each Topical Daily   cloNIDine   0.3 mg Transdermal Weekly   cloNIDine   0.1 mg Oral BID   diltiazem   120 mg Oral Daily   doxazosin   4 mg Oral Daily   feeding supplement (NEPRO CARB STEADY)  237 mL Oral TID BM   losartan   100 mg Oral  Daily   QUEtiapine   25 mg Oral TID   QUEtiapine   50 mg Oral Daily   sevelamer  carbonate  1,600 mg Oral TID WC   sodium chloride  flush  10-40 mL Intracatheter Q12H   spironolactone   25 mg Oral Daily   acetaminophen  **OR** acetaminophen  (TYLENOL ) oral liquid 160 mg/5 mL **OR** acetaminophen , haloperidol  lactate, hydrALAZINE , labetalol , LORazepam , ondansetron  (ZOFRAN ) IV, QUEtiapine , sodium chloride  flush, sodium chloride  flush, traZODone   Assessment/ Plan:  [name] is a 68 y.o.   female with past medical conditions including end stage renal disease on hemodialysis, dementia, hypertension, diabetes, hyperlipidemia, cervical cancer, recurrent pancreatitis, PVD, and end-stage renal disease on hemodialysis.  Patient presents to the emergency department after experiencing slurred speech, left-sided facial spasms and weakness.  Patient has been admitted for TIA (transient ischemic attack) [G45.9] Hallucination [R44.3]  [facility] [place]/MWF/Lt AVG    End-stage renal disease on hemodialysis. - Receiving dialysis today, UF 1.5L as tolerated. Next treatment scheduled for Wednesday.   Transient ischemic attack, reporting symptoms of slurred speech, involuntary left-sided facial spasms and weakness.  Symptoms resolved.  Neurology consulted, TIA versus partial complex seizure.  Brain imaging negative for acute infarct.  Carotid ultrasound shows right-sided 50 to 60% stenosis without plaque.    3.  Hypertension: hypertensive urgency on admission.  - continue losartan , carvedilol , clonidine , diltiazem , doxazosin , and spironolactone .  blood pressure severely elevated on admission.    - discontinued isosorbide  mononitrate due to family concerns.   - Patient ordered Clonidine  patch, blood pressure 112/70 during dialysis  4. Secondary Hyperparathyroidism:  - continue sevelamer   - Calcium  and phos acceptable  5. Anemia with chronic kidney disease: hemoglobin 10.8. Mircera as outpatient. Holding ESA due to acute ischemic event.   -Hgb stable, 10.8      LOS: 11 Tracy Jennings 7/7/202512:09 PM

## 2024-03-18 NOTE — Progress Notes (Signed)
 Prn labetalol  given after placing pt on telemetry. IV push given over . HR remained 74/76 throughout the push and post medication.

## 2024-03-18 NOTE — Progress Notes (Signed)
 Progress Note   Patient: Tracy Jennings FMW:[id number] DOB: 11-Dec-1955 DOA: 03/05/2024     11 DOS: the patient was seen and examined on 03/18/2024   Brief hospital course: MARCELLENE SHIVLEY is a 68 year old female with ESRD on HD MWF, HTN, HLD, DM, PVD, dementia, chronic recurrent pancreatitis, stroke, cervical cancer, HIT, who presents with slurred speech, left facial spasm, left-sided weakness.  Patient had syncopal episode on 6/23 and was seen in the ED.  At that time she was found to have O2 desaturation to 80% due to pulmonary edema on chest x-ray as well as hyperkalemia with a potassium of 6.2.  She had negative head CT during that admission.  She received hemodialysis and then was discharged home.  Her last known normal was 6/24 at 1700.  Upon arrival to the ED patient's confusion had resolved entirely.  Labs in the ED mostly unremarkable, potassium 4.6, creatinine consistent with ESRD.  Blood pressure was found to be 252/79.  Patient was placed in telemetry bed for observation.  Code stroke was called.Blood pressure resolved.  Head CT was without acute intracranial abnormality.  Chronic infarct seen on right parietal lobe and extensive chronic microvascular ischemic changes with remote lacunar infarcts in the right caudate and right thalamus appreciated.  Patient was admitted for workup of presumed TIA.  LDL 96, hemoglobin A1c 5.1%.  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  MRI brain revealed chronic encephalomalacia within the right parietal lobe and advanced diffuse cerebral white matter disease.  Patient underwent carotid Dopplers which revealed elevated velocity within the distal right internal carotid artery suggesting stenosis of 50 to 69% though no significant plaque identified and thus this may be artifactual. Patient underwent hemodialysis on 6/25 but left her session early AMA.she attempted to leave the hospital AMA but during this event began hallucinating and  loss capacity.  Psychiatry was consulted and met with the patient on 6/26 and confirms acute delirium, currently the patient does not have capacity to make medical decisions.  She has been started on Seroquel  for presumed Lewy body dementia.  She has had persistent agitation and Seroquel  has been slowly increased. She is currently medically stable and undergoing routine HD while awaiting safe dispo. TOC is consulted.   BP is labile and high.  Family voiced concern about isosorbide  mononitrate.  Nephrology discontinued isosorbide  and possibly add some other medications.  Patient is currently on losartan , Coreg , clonidine , diltiazem , doxazosin  and the spironolactone .  7/6.  Case discussed with nephrology and switching clonidine  pills over to clonidine  patch.  Imdur  discontinued the day prior.  Family interested in taking patient home rather than going to rehab 7/7.  Patient seen on dialysis and blood pressure on the lower side.  Continue to monitor closely.  Patient complains of pains and was given Tylenol .   Assessment and Plan: * Hypertensive urgency Blood pressure on the lower side with dialysis today.  Discontinue clonidine  pills since patient now on clonidine  patch.  Continue Coreg , Cardizem  CD, Cardura , losartan , spironolactone   TIA (transient ischemic attack) Ruled out.  MRI of the brain shows chronic encephalomalacia within the right parietal lobe and advanced diffuse cerebral white matter disease, carotid Doppler showing elevated velocity in the distal right internal carotid suggesting stenosis of 50 to 69%, EEG showing cortical dysfunction arising from the right temporal parietal region secondary to underlying structural abnormality no seizure or left to form discharges, EF normal range.  Acute metabolic encephalopathy Family thinks it may be secondary  to medication once try to limit medication.  Imdur  discontinued, clonidine  pills changed over to patch.  Dementia with behavioral  disturbance (HCC) Patient on Seroquel  and as needed Haldol .  Patient seen by psychiatry does not have the capacity to leave AMA.  Family would like to take her home rather than going out to rehab.  ESRD (end stage renal disease) (HCC) Continue dialysis Monday Wednesday Friday  Type II diabetes mellitus with renal manifestations (HCC) Last hemoglobin A1c 5.1  Anemia in ESRD (end-stage renal disease) (HCC) Last hemoglobin 10.8  Hyperlipidemia Continue Lipitor  Hyponatremia Dialysis to manage        Subjective: Blood pressure on the lower side with dialysis.  Continue to monitor.  Blood pressure was high earlier today.  Patient complains of pains in the legs and people manipulating her legs.  Physical Exam: Vitals:   03/18/24 1130 03/18/24 1200 03/18/24 1215 03/18/24 1221  BP: (!) 97/50 (!) 85/46 (!) 90/41 (!) 100/58  Pulse:  62 62 61  Resp:  10 11 12   Temp:   98.8 F (37.1 C)   TempSrc:   Axillary   SpO2:  96% 99% 98%  Weight:      Height:       Physical Exam HENT:     Head: Normocephalic.     Mouth/Throat:     Pharynx: No oropharyngeal exudate.  Eyes:     General: Lids are normal.     Conjunctiva/sclera: Conjunctivae normal.  Cardiovascular:     Rate and Rhythm: Normal rate and regular rhythm.     Heart sounds: S1 normal and S2 normal. Murmur heard.     Systolic murmur is present with a grade of 3/6.  Pulmonary:     Breath sounds: No decreased breath sounds, wheezing, rhonchi or rales.  Abdominal:     Palpations: Abdomen is soft.     Tenderness: There is no abdominal tenderness.  Musculoskeletal:     Right lower leg: No swelling.     Left lower leg: No swelling.  Skin:    General: Skin is warm.     Findings: No rash.  Neurological:     Mental Status: She is alert.     Comments: Answering questions.  Moves all extremities     Data Reviewed: No new data  Family Communication: Left message for sister  Disposition: Status is: Inpatient Remains  inpatient appropriate because: Receiving dialysis today and watching blood pressure closely to determine a good blood pressure better managed to go home with.  Planned Discharge Destination: Home with home health.  Family does not want her to go into rehab.    Time spent: 28 minutes  Author: Charlie Patterson, MD 03/18/2024 12:25 PM  For on call review [email or web address].

## 2024-03-18 NOTE — Progress Notes (Signed)
 Pt confused and calling out that there are people trying to stick needles in me and that her sister is here to pick her up. Trying to get OOB despite multiple attempt to redirect and decreased environmental stimuli.

## 2024-03-18 NOTE — Plan of Care (Signed)
 Elevated BP has been ongoing concern overnight. PRN medication ordered by night covering provider with minimal response. Nuero checks unchanged from earlier assessment.  Problem: Education: Goal: Knowledge of disease or condition will improve Outcome: Progressing   Problem: Coping: Goal: Will verbalize positive feelings about self Outcome: Progressing

## 2024-03-18 NOTE — Progress Notes (Signed)
 PT Cancellation Note  Patient Details Name: Tracy Jennings MRN: [id number] DOB: Mar 28, 1956   Cancelled Treatment:    Reason Eval/Treat Not Completed: Other (comment).  Pt currently of unit at dialysis.  Will re-attempt PT session at a later date/time.  Damien Caulk, PT 03/18/24, 11:03 AM

## 2024-03-18 NOTE — Progress Notes (Signed)
 BP continuous to be elevated this morning. Pt sleepy but responsive, bilateral strength equal and unchanged from earlier.   Consulted with ICU charge RN Clotilda regarding this patient to keep her informed.

## 2024-03-18 NOTE — TOC Progression Note (Signed)
 Transition of Care [facility]) - Progression Note    Patient Details  Name: DALA BREAULT MRN: [id number] Date of Birth: 08-08-1956  Transition of Care [facility] CM/SW Contact  Elouise LULLA Capri, RN Phone Number: 03/18/2024, 11:57 AM  Clinical Narrative:     CM discussed case with Dr. Josette regarding  discharge care planning. Hospital day 13 with diagnosis of Hypertensive urgency. EDD is 03/19/2024. [facility] is following to discharge. CM secure message to Thom, [employer], regarding update on DME, bedside commode  Expected Discharge Plan: Skilled Nursing Facility Barriers to Discharge: No SNF bed  Expected Discharge Plan and Services   Discharge Planning Services: CM Consult   Living arrangements for the past 2 months: Single Family Home                   Social Determinants of Health (SDOH) Interventions SDOH Screenings   Food Insecurity: Food Insecurity Present (03/06/2024)  Housing: Low Risk  (03/06/2024)  Transportation Needs: No Transportation Needs (03/06/2024)  Utilities: Not At Risk (03/06/2024)  Financial Resource Strain: Low Risk  (01/09/2024)   Received from [facility]  Social Connections: Unknown (03/06/2024)  Tobacco Use: Low Risk  (03/06/2024)    Readmission Risk Interventions     No data to display

## 2024-03-18 NOTE — Plan of Care (Signed)
  Problem: Nutrition: Goal: Risk of aspiration will decrease Outcome: Progressing Goal: Dietary intake will improve Outcome: Progressing   Problem: Clinical Measurements: Goal: Ability to maintain clinical measurements within normal limits will improve Outcome: Progressing Goal: Will remain free from infection Outcome: Progressing Goal: Diagnostic test results will improve Outcome: Progressing Goal: Respiratory complications will improve Outcome: Progressing Goal: Cardiovascular complication will be avoided Outcome: Progressing   Problem: Activity: Goal: Risk for activity intolerance will decrease Outcome: Progressing   Problem: Nutrition: Goal: Adequate nutrition will be maintained Outcome: Progressing   Problem: Safety: Goal: Ability to remain free from injury will improve Outcome: Progressing   Problem: Skin Integrity: Goal: Risk for impaired skin integrity will decrease Outcome: Progressing

## 2024-03-18 NOTE — Progress Notes (Signed)
 Mobility Specialist - Progress Note   03/18/24 1600  Mobility  Activity Ambulated with assistance in room;Stood at bedside;Dangled on edge of bed  Level of Assistance Minimal assist, patient does 75% or more  Assistive Device Front wheel walker  Distance Ambulated (ft) 10 ft  Activity Response Tolerated well  Mobility visit 1 Mobility     Pt lying in bed upon arrival, utilizing RA. Pt agreeable to activity. Reports generalized pain all over body, mostly abdomen and BLE. Pt completed bed mobility modI. STS x3 with mod-maxA as pt fatigues. Assist to don briefs upon pt request with minA. Pt ambulated ~10' in room before requesting to sit d/t weakness. Pt reports B knee buckling but none noticed by author. Pt returned to bed with alarm set, needs in reach. Family at bedside.    Lennette Seip Mobility Specialist 03/18/24, 4:22 PM

## 2024-03-19 DIAGNOSIS — R410 Disorientation, unspecified: Secondary | ICD-10-CM | POA: Diagnosis not present

## 2024-03-19 DIAGNOSIS — N186 End stage renal disease: Secondary | ICD-10-CM | POA: Diagnosis not present

## 2024-03-19 DIAGNOSIS — F03918 Unspecified dementia, unspecified severity, with other behavioral disturbance: Secondary | ICD-10-CM | POA: Diagnosis not present

## 2024-03-19 DIAGNOSIS — I160 Hypertensive urgency: Secondary | ICD-10-CM | POA: Diagnosis not present

## 2024-03-19 LAB — GLUCOSE, CAPILLARY: Glucose-Capillary: 121 mg/dL — ABNORMAL HIGH (ref 70–99)

## 2024-03-19 MED ORDER — CLONIDINE HCL 0.1 MG PO TABS
0.1000 mg | ORAL_TABLET | Freq: Two times a day (BID) | ORAL | Status: DC
  Filled 2024-03-19: qty 1

## 2024-03-19 MED ORDER — QUETIAPINE FUMARATE 25 MG PO TABS
75.0000 mg | ORAL_TABLET | ORAL | Status: DC
  Administered 2024-03-19 – 2024-03-28 (×9): 75 mg via ORAL
  Filled 2024-03-19 (×11): qty 3

## 2024-03-19 NOTE — Progress Notes (Signed)
 [facility]  ROUNDING NOTE   Subjective:   Tracy Jennings is a 68 year old female with past medical conditions including dementia, hypertension, diabetes, hyperlipidemia, cervical cancer, recurrent pancreatitis, PVD, and end-stage renal disease on hemodialysis.  Patient presents to the emergency department after experiencing slurred speech, left-sided facial spasms and weakness.  Patient has been admitted on 03/05/2024 for TIA (transient ischemic attack) [G45.9] Hallucination [R44.3]  Patient is known to our practice and receives outpatient dialysis treatments at [facility] on a MWF schedule, supervised by Dr. Dennise.   Update:  Patient very confused today Alert to self only   Objective:  Vital signs in last 24 hours:  Temp:  [98 F (36.7 C)-98.8 F (37.1 C)] 98 F (36.7 C) (07/08 0739) Pulse Rate:  [61-74] 74 (07/08 0739) Resp:  [10-18] 18 (07/08 0739) BP: (85-197)/(41-67) 117/50 (07/08 0739) SpO2:  [96 %-100 %] 100 % (07/08 0739) Weight:  [51.9 kg] 51.9 kg (07/07 1221)  Weight change:  Filed Weights   03/15/24 0750 03/18/24 0832 03/18/24 1221  Weight: 52 kg 53.4 kg 51.9 kg    Intake/Output: I/O last 3 completed shifts: In: 10 [I.V.:10] Out: 1500 [Other:1500]   Intake/Output this shift:  No intake/output data recorded.  Physical Exam: General: NAD, laying in bed  Head: Normocephalic, atraumatic. Moist oral mucosal membranes  Eyes: Anicteric  Neck: Supple  Lungs:  Clear to auscultation, normal effort  Heart: Regular rate and rhythm  Abdomen:  Soft, nontender  Extremities: No peripheral edema.  Neurologic: Awake, alert  Skin: Warm,dry, no rash  Access: Left aVF    Basic Metabolic Panel: Recent Labs  Lab 03/13/24 0745 03/15/24 0800 03/16/24 1515  NA 134* 131* 131*  K 4.6 4.3 3.8  CL 94* 90* 90*  CO2 27 27 31   GLUCOSE 95 104* 125*  BUN 49* 41* 23  CREATININE 8.52* 7.95* 5.57*  CALCIUM  9.5 9.0 8.6*  PHOS 5.6* 5.2*  --      Liver Function Tests: Recent Labs  Lab 03/13/24 0745 03/15/24 0800  ALBUMIN 3.4* 3.4*   No results for input(s): LIPASE, AMYLASE in the last 168 hours. No results for input(s): AMMONIA in the last 168 hours.  CBC: Recent Labs  Lab 03/13/24 0745 03/15/24 0417 03/17/24 0457  WBC 4.4 4.2 5.2  HGB 10.1* 10.4* 10.8*  HCT 31.7* 32.0* 33.8*  MCV 94.1 92.5 92.6  PLT 189 192 188    Cardiac Enzymes: No results for input(s): CKTOTAL, CKMB, CKMBINDEX, TROPONINI in the last 168 hours.  BNP: Invalid input(s): POCBNP  CBG: Recent Labs  Lab 03/16/24 0804 03/17/24 0745 03/18/24 0726 03/18/24 2042 03/19/24 0739  GLUCAP 126* 146* 146* 113* 121*    Microbiology: Results for orders placed or performed during the hospital encounter of 09/14/22  Resp panel by RT-PCR (RSV, Flu A&B, Covid) Anterior Nasal Swab     Status: None   Collection Time: 09/14/22  2:45 AM   Specimen: Anterior Nasal Swab  Result Value Ref Range Status   SARS Coronavirus 2 by RT PCR NEGATIVE NEGATIVE Final    Comment: (NOTE) SARS-CoV-2 target nucleic acids are NOT DETECTED.  The SARS-CoV-2 RNA is generally detectable in upper respiratory specimens during the acute phase of infection. The lowest concentration of SARS-CoV-2 viral copies this assay can detect is 138 copies/mL. A negative result does not preclude SARS-Cov-2 infection and should not be used as the sole basis for treatment or other patient management decisions. A negative result may occur with  improper  specimen collection/handling, submission of specimen other than nasopharyngeal swab, presence of viral mutation(s) within the areas targeted by this assay, and inadequate number of viral copies(<138 copies/mL). A negative result must be combined with clinical observations, patient history, and epidemiological information. The expected result is Negative.  Fact Sheet for Patients:   BloggerCourse.com  Fact Sheet for Healthcare Providers:  SeriousBroker.it  This test is no t yet approved or cleared by the [place]  FDA and  has been authorized for detection and/or diagnosis of SARS-CoV-2 by FDA under an Emergency Use Authorization (EUA). This EUA will remain  in effect (meaning this test can be used) for the duration of the COVID-19 declaration under Section 564(b)(1) of the Act, 21 U.S.C.section 360bbb-3(b)(1), unless the authorization is terminated  or revoked sooner.       Influenza A by PCR NEGATIVE NEGATIVE Final   Influenza B by PCR NEGATIVE NEGATIVE Final    Comment: (NOTE) The Xpert Xpress SARS-CoV-2/FLU/RSV plus assay is intended as an aid in the diagnosis of influenza from Nasopharyngeal swab specimens and should not be used as a sole basis for treatment. Nasal washings and aspirates are unacceptable for Xpert Xpress SARS-CoV-2/FLU/RSV testing.  Fact Sheet for Patients: BloggerCourse.com  Fact Sheet for Healthcare Providers: SeriousBroker.it  This test is not yet approved or cleared by the [place]  FDA and has been authorized for detection and/or diagnosis of SARS-CoV-2 by FDA under an Emergency Use Authorization (EUA). This EUA will remain in effect (meaning this test can be used) for the duration of the COVID-19 declaration under Section 564(b)(1) of the Act, 21 U.S.C. section 360bbb-3(b)(1), unless the authorization is terminated or revoked.     Resp Syncytial Virus by PCR NEGATIVE NEGATIVE Final    Comment: (NOTE) Fact Sheet for Patients: BloggerCourse.com  Fact Sheet for Healthcare Providers: SeriousBroker.it  This test is not yet approved or cleared by the [place]  FDA and has been authorized for detection and/or diagnosis of SARS-CoV-2 by FDA under an Emergency Use  Authorization (EUA). This EUA will remain in effect (meaning this test can be used) for the duration of the COVID-19 declaration under Section 564(b)(1) of the Act, 21 U.S.C. section 360bbb-3(b)(1), unless the authorization is terminated or revoked.  Performed at [facility], [address] [place], [place] [address]     Coagulation Studies: No results for input(s): LABPROT, INR in the last 72 hours.   Urinalysis: No results for input(s): COLORURINE, LABSPEC, PHURINE, GLUCOSEU, HGBUR, BILIRUBINUR, KETONESUR, PROTEINUR, UROBILINOGEN, NITRITE, LEUKOCYTESUR in the last 72 hours.  Invalid input(s): APPERANCEUR    Imaging: No results found.    Medications:     aspirin   300 mg Rectal Daily   Or   aspirin   325 mg Oral Daily   atorvastatin   40 mg Oral Daily   carvedilol   25 mg Oral BID WC   Chlorhexidine  Gluconate Cloth  6 each Topical Daily   cloNIDine   0.3 mg Transdermal Weekly   diltiazem   120 mg Oral Daily   doxazosin   4 mg Oral Daily   feeding supplement (NEPRO CARB STEADY)  237 mL Oral TID BM   losartan   100 mg Oral Daily   polyethylene glycol  17 g Oral Daily   QUEtiapine   25 mg Oral TID   QUEtiapine   50 mg Oral Daily   sevelamer  carbonate  1,600 mg Oral TID WC   sodium chloride  flush  10-40 mL Intracatheter Q12H   spironolactone   25 mg Oral Daily   acetaminophen  **OR**  acetaminophen  (TYLENOL ) oral liquid 160 mg/5 mL **OR** acetaminophen , haloperidol  lactate, hydrALAZINE , hydrALAZINE , labetalol , LORazepam , ondansetron  (ZOFRAN ) IV, QUEtiapine , sodium chloride  flush, sodium chloride  flush, traZODone   Assessment/ Plan:  Tracy Jennings is a 68 y.o.  female with past medical conditions including end stage renal disease on hemodialysis, dementia, hypertension, diabetes, hyperlipidemia, cervical cancer, recurrent pancreatitis, PVD, and end-stage renal disease on hemodialysis.  Patient presents to the emergency department after  experiencing slurred speech, left-sided facial spasms and weakness.  Patient has been admitted for TIA (transient ischemic attack) [G45.9] Hallucination [R44.3]  [facility]/MWF/Lt AVG    End-stage renal disease on hemodialysis. - Patient received dialysis yesterday, UF 1.5 L achieved. - Next treatment scheduled for Wednesday.  Transient ischemic attack, reporting symptoms of slurred speech, involuntary left-sided facial spasms and weakness.  Neurology consulted, TIA versus partial complex seizure.  Brain imaging negative for acute infarct.  Carotid ultrasound shows right-sided 50 to 60% stenosis without plaque.  Symptoms resolved.    3.  Hypertension: hypertensive urgency on admission.  - continue losartan , carvedilol , clonidine , diltiazem , doxazosin , and spironolactone .  blood pressure severely elevated on admission.    - discontinued isosorbide  mononitrate due to family concerns.   - Blood pressure stable with clonidine  patch, 117/50.  4. Secondary Hyperparathyroidism:  - continue sevelamer   - Will continue to monitor bone minerals during this admission.  5. Anemia with chronic kidney disease: hemoglobin 10.8. Mircera as outpatient. Holding ESA due to acute ischemic event.   -Hgb stable, 10.8      LOS: 12 Lanae Federer 7/8/202511:35 AM

## 2024-03-19 NOTE — Evaluation (Signed)
 Occupational Therapy Re- Evaluation Patient Details Name: Tracy Jennings MRN: [id number] DOB: 1956-02-25 Today's Date: 03/19/2024   History of Present Illness   Pt is a 68 year old female presents with slurred speech,  left facial spasm, left sided weakness.    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 Pt appears to be asking about someone that she believes is present in room but is not there. Pt is lethargic throughout. Initially agreeable to OT intervention but when give cues for bed mobility she does not initiate. Total A for bed mobility with pt giving no effort. Pt assisted back to supine and placed in L side lying position secondary to lack of pt participation this session. Goals remain the same. Pt has been inconsistent from session to session in regards to progress. Call bell and all needed items within reach.      If plan is discharge home, recommend the following:   A little help with walking and/or transfers;A little help with bathing/dressing/bathroom;Assistance with cooking/housework;Direct supervision/assist for medications management;Direct supervision/assist for financial management;Supervision due to cognitive status;Help with stairs or ramp for entrance;Assist for transportation      Equipment Recommendations   BSC/3in1      Precautions/Restrictions   Precautions Precautions: Fall     Mobility Bed Mobility Overal bed mobility: Needs Assistance Bed Mobility: Rolling, Supine to Sit, Sit to Supine Rolling: Total assist   Supine to sit: Total assist Sit to supine: Total assist   General bed mobility comments: Pt gives no effort    Transfers                              ADL either performed or assessed with clinical  judgement      Vision Patient Visual Report: No change from baseline              Pertinent Vitals/Pain Pain Assessment Pain Assessment: No/denies pain     Extremity/Trunk Assessment Upper Extremity Assessment Upper Extremity Assessment: Generalized weakness   Lower Extremity Assessment Lower Extremity Assessment: Generalized weakness       Communication Communication Communication: No apparent difficulties   Cognition Arousal: Lethargic Behavior During Therapy: Flat affect Cognition: No family/caregiver present to determine baseline, History of cognitive impairments, Cognition impaired   Orientation impairments: Situation, Time, Place, Person Awareness: Online awareness impaired, Intellectual awareness impaired   Attention impairment (select first level of impairment): Focused attention   OT - Cognition Comments: Pt appears to be visually hallucinating during session                 Following commands: Impaired Following commands impaired: Follows one step commands inconsistently     Cueing  General Comments   Cueing Techniques: Verbal cues;Gestural cues;Tactile cues;Visual cues                             OT Goals(Current goals can be found in the care plan section)   Acute Rehab OT Goals OT Goal Formulation: With patient Time For Goal Achievement: 04/15/24 Potential to Achieve Goals: Fair   OT Frequency:  Min 2X/week       AM-PAC OT 6 Clicks Daily Activity     Outcome Measure Help from another  person eating meals?: A Little Help from another person taking care of personal grooming?: A Little   Help from another person bathing (including washing, rinsing, drying)?: A Lot Help from another person to put on and taking off regular upper body clothing?: A Little Help from another person to put on and taking off regular lower body clothing?: A Little 6 Click Score: 14   End of Session Nurse Communication: Mobility  status  Activity Tolerance: Patient limited by lethargy Patient left: with call bell/phone within reach;in bed;with bed alarm set  OT Visit Diagnosis: Other abnormalities of gait and mobility (R26.89);Muscle weakness (generalized) (M62.81);Cognitive communication deficit (R41.841)                Time: 8890-8881 OT Time Calculation (min): 9 min Charges:  OT General Charges $OT Visit: 1 Visit OT Evaluation $OT Re-eval: 1 Re-eval OT Treatments $Therapeutic Activity: 8-22 mins  Izetta Claude, MS, OTR/L , CBIS ascom [phone]  03/19/24, 1:56 PM

## 2024-03-19 NOTE — Assessment & Plan Note (Addendum)
 With hallucinations.  Patient did not sleep last night and was sleeping during the day.  Patient may do better in her home environment rather than in the hospital.  Spoke with brother on the phone.  He will speak with other family members.  At this point he would need 24/7 care at home.  We can set up home health but they are not there all the time.  Increase dose of Seroquel  at night to 75 mg.  On Seroquel  during the day also.

## 2024-03-19 NOTE — Progress Notes (Signed)
 Progress Note   Patient: Tracy Jennings FMW:[id number] DOB: 1956-03-14 DOA: 03/05/2024     12 DOS: the patient was seen and examined on 03/19/2024   Brief hospital course: MARGRETT KALB is a 68 year old female with ESRD on HD MWF, HTN, HLD, DM, PVD, dementia, chronic recurrent pancreatitis, stroke, cervical cancer, HIT, who presents with slurred speech, left facial spasm, left-sided weakness.  Patient had syncopal episode on 6/23 and was seen in the ED.  At that time she was found to have O2 desaturation to 80% due to pulmonary edema on chest x-ray as well as hyperkalemia with a potassium of 6.2.  She had negative head CT during that admission.  She received hemodialysis and then was discharged home.  Her last known normal was 6/24 at 1700.  Upon arrival to the ED patient's confusion had resolved entirely.  Labs in the ED mostly unremarkable, potassium 4.6, creatinine consistent with ESRD.  Blood pressure was found to be 252/79.  Patient was placed in telemetry bed for observation.  Code stroke was called.Blood pressure resolved.  Head CT was without acute intracranial abnormality.  Chronic infarct seen on right parietal lobe and extensive chronic microvascular ischemic changes with remote lacunar infarcts in the right caudate and right thalamus appreciated.  Patient was admitted for workup of presumed TIA.  LDL 96, hemoglobin A1c 5.1%.  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  MRI brain revealed chronic encephalomalacia within the right parietal lobe and advanced diffuse cerebral white matter disease.  Patient underwent carotid Dopplers which revealed elevated velocity within the distal right internal carotid artery suggesting stenosis of 50 to 69% though no significant plaque identified and thus this may be artifactual. Patient underwent hemodialysis on 6/25 but left her session early AMA.she attempted to leave the hospital AMA but during this event began hallucinating and  loss capacity.  Psychiatry was consulted and met with the patient on 6/26 and confirms acute delirium, currently the patient does not have capacity to make medical decisions.  She has been started on Seroquel  for presumed Lewy body dementia.  She has had persistent agitation and Seroquel  has been slowly increased. She is currently medically stable and undergoing routine HD while awaiting safe dispo. TOC is consulted.   BP is labile and high.  Family voiced concern about isosorbide  mononitrate.  Nephrology discontinued isosorbide  and possibly add some other medications.  Patient is currently on losartan , Coreg , clonidine , diltiazem , doxazosin  and the spironolactone .  7/6.  Case discussed with nephrology and switching clonidine  pills over to clonidine  patch.  Imdur  discontinued the day prior.  Family interested in taking patient home rather than going to rehab 7/7.  Patient seen on dialysis and blood pressure on the lower side.  Continue to monitor closely.  Patient complains of pains and was given Tylenol . 7/8.  Patient did not sleep last night.  She was able to answer questions and follow commands for me this morning.  Was sleeping during the day.   Assessment and Plan: * Hypertensive urgency Blood pressure on the lower side with dialysis yesterday.  High blood pressure overnight but she did not sleep last night.  Last blood pressure normal.  Continue clonidine  patch.  Continue Coreg , Cardizem  CD, Cardura , losartan , spironolactone .  Acute delirium With hallucinations.  Patient did not sleep last night and was sleeping during the day.  Patient may do better in her home environment rather than in the hospital.  Spoke with brother on the phone.  He  will speak with other family members.  At this point he would need 24/7 care at home.  We can set up home health but they are not there all the time.  Increase dose of Seroquel  at night to 75 mg.  On Seroquel  during the day also.  Dementia with behavioral  disturbance (HCC) Patient on Seroquel  and as needed Haldol .  Patient seen by psychiatry does not have the capacity to leave AMA.  Family would like to take her home rather than going out to rehab.  ESRD (end stage renal disease) (HCC) Continue dialysis Monday Wednesday Friday  Type II diabetes mellitus with renal manifestations (HCC) Last hemoglobin A1c 5.1  Anemia in ESRD (end-stage renal disease) (HCC) Last hemoglobin 10.8  Hyperlipidemia Continue Lipitor  TIA (transient ischemic attack) Ruled out.  MRI of the brain shows chronic encephalomalacia within the right parietal lobe and advanced diffuse cerebral white matter disease, carotid Doppler showing elevated velocity in the distal right internal carotid suggesting stenosis of 50 to 69%, EEG showing cortical dysfunction arising from the right temporal parietal region secondary to underlying structural abnormality no seizure or left to form discharges, EF normal range.  Hyponatremia Dialysis to manage        Subjective: Patient feels okay.  Offers no complaints.  Blood pressure high overnight but patient also did not sleep.  Blood pressure better this morning after repeat blood pressure.  Physical Exam: Vitals:   03/18/24 2200 03/18/24 2334 03/19/24 0244 03/19/24 0739  BP: (!) 182/62 (!) 197/67 (!) 174/67 (!) 117/50  Pulse:  71 70 74  Resp:    18  Temp:  98.1 F (36.7 C) 98.8 F (37.1 C) 98 F (36.7 C)  TempSrc:      SpO2:  99% 97% 100%  Weight:      Height:       Physical Exam HENT:     Head: Normocephalic.     Mouth/Throat:     Pharynx: No oropharyngeal exudate.  Eyes:     General: Lids are normal.     Conjunctiva/sclera: Conjunctivae normal.  Cardiovascular:     Rate and Rhythm: Normal rate and regular rhythm.     Heart sounds: S1 normal and S2 normal. Murmur heard.     Systolic murmur is present with a grade of 3/6.  Pulmonary:     Breath sounds: No decreased breath sounds, wheezing, rhonchi or rales.   Abdominal:     Palpations: Abdomen is soft.     Tenderness: There is no abdominal tenderness.  Musculoskeletal:     Right lower leg: No swelling.     Left lower leg: No swelling.  Skin:    General: Skin is warm.     Findings: No rash.  Neurological:     Mental Status: She is alert.     Comments: Patient answers some questions this morning and follows some simple commands.     Data Reviewed: No new data  Family Communication: Unable to reach sister on the phone.  Spoke with brother on the phone.  He states he needs to speak with other family members about taking care of her at home.  States patient lives by herself.  I told him she would need 24/7 care at home.  Disposition: Status is: Inpatient Remains inpatient appropriate because: Family will speak with each other about plan.  Patient will need 24/7 care at home initially.  Planned Discharge Destination: Home with Home Health (physical therapy recommending rehab but family would rather  take home.)    Time spent: 28 minutes Case discussed with nursing staff.  Author: Charlie Patterson, MD 03/19/2024 5:31 PM  For on call review [email or web address].

## 2024-03-19 NOTE — Progress Notes (Signed)
 Physical Therapy Treatment Patient Details Name: Tracy Jennings MRN: [id number] DOB: 1956-08-11 Today's Date: 03/19/2024   History of Present Illness Pt is a 68 year old female presents with slurred speech,  left facial spasm, left sided weakness.    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 During session pt attempted 2x trials of STS from recliner to RW, required mod/max assist to get to standing and was unable to stand erect due to 10/10 pain generalized body pain. Following unsuccessful attempts at a standing pivot transfer, secondary PT assisted by providing a knee block to scoot laterally from recliner to bed (max/total x2). Pt would benefit from skilled PT to continue to work towards PT goals and return to PLOF.  Following treatment PT notified nurse of pt's mobility tolerance and request for pain meds.    If plan is discharge home, recommend the following: Assistance with cooking/housework;Assist for transportation;Help with stairs or ramp for entrance;Supervision due to cognitive status;A lot of help with walking and/or transfers;A lot of help with bathing/dressing/bathroom   Can travel by private vehicle     No  Equipment Recommendations  Rolling walker (2 wheels)    Recommendations for Other Services       Precautions / Restrictions Precautions Precautions: Fall Recall of Precautions/Restrictions: Impaired Restrictions Weight Bearing Restrictions Per Provider Order: No     Mobility  Bed Mobility Overal bed mobility: Needs Assistance Bed Mobility: Sit to Supine       Sit to supine: Total assist, +2 for physical assistance   General bed mobility comments: Pt gives no effort    Transfers Overall transfer level: Needs assistance Equipment used:  Rolling walker (2 wheels) Transfers: Sit to/from Stand, Bed to chair/wheelchair/BSC (attempted 2x STS required mod/max assist to get to standing, was unable to stand erect due to pain limiting treatment.) Sit to Stand: Mod assist, Max assist   Step pivot transfers: Max assist, Total assist, +2 physical assistance       General transfer comment: PT provided constant cues for movement intiation, hand placement, transfer safety, and sequencing. After 2x attempts, allowing pt to come to standing, pt could not fully initate the motion. Secondary PT assisted by providing a knee block to scoot laterally from recliner to bed.    Ambulation/Gait               General Gait Details: Deferred due to increased reported fatigue and pain   Stairs             Wheelchair Mobility     Tilt Bed    Modified Rankin (Stroke Patients Only)       Balance Overall balance assessment: Needs assistance Sitting-balance support: Feet supported, Bilateral upper extremity supported, Single extremity supported Sitting balance-Leahy Scale: Poor Sitting balance - Comments: Unable to maintain unsupported sitting balance       Standing balance comment: Deferred                            Communication Communication Communication: No apparent difficulties  Cognition Arousal: Lethargic Behavior During Therapy: Flat affect   PT - Cognitive impairments: History of cognitive impairments  Following commands: Impaired Following commands impaired: Follows one step commands inconsistently, Follows one step commands with increased time    Cueing Cueing Techniques: Verbal cues, Gestural cues, Tactile cues, Visual cues  Exercises      General Comments General comments (skin integrity, edema, etc.): Pre session HR: 68 bpm, SpO2:96 % on room air. Post session HR: 72 bpm, SpO2: 98+% on room air, R chest port: intact and dry with no noted change throughout  session.      Pertinent Vitals/Pain Pain Assessment Pain Assessment: 0-10 Pain Score: 10-Worst pain ever    Home Living                          Prior Function            PT Goals (current goals can now be found in the care plan section) Acute Rehab PT Goals Patient Stated Goal: go home PT Goal Formulation: With patient Time For Goal Achievement: 03/20/24 Potential to Achieve Goals: Fair Progress towards PT goals: Progressing toward goals    Frequency    Min 2X/week      PT Plan      Co-evaluation              AM-PAC PT 6 Clicks Mobility   Outcome Measure  Help needed turning from your back to your side while in a flat bed without using bedrails?: Total Help needed moving from lying on your back to sitting on the side of a flat bed without using bedrails?: Total Help needed moving to and from a bed to a chair (including a wheelchair)?: Total Help needed standing up from a chair using your arms (e.g., wheelchair or bedside chair)?: Total Help needed to walk in hospital room?: Total Help needed climbing 3-5 steps with a railing? : Total 6 Click Score: 6    End of Session Equipment Utilized During Treatment: Gait belt Activity Tolerance: Patient limited by lethargy;Patient limited by fatigue;Patient limited by pain Patient left: with call bell/phone within reach;in bed;with bed alarm set;with family/visitor present Nurse Communication: Mobility status;Patient requests pain meds PT Visit Diagnosis: Muscle weakness (generalized) (M62.81);Difficulty in walking, not elsewhere classified (R26.2);Pain Pain - part of body:  (Stomach)     Time: 8479-8454 PT Time Calculation (min) (ACUTE ONLY): 25 min  Charges:                            Voncile Schwarz, SPT 03/19/24, 4:41 PM

## 2024-03-20 DIAGNOSIS — I160 Hypertensive urgency: Secondary | ICD-10-CM | POA: Diagnosis not present

## 2024-03-20 LAB — BASIC METABOLIC PANEL WITH GFR
Anion gap: 12 (ref 5–15)
BUN: 42 mg/dL — ABNORMAL HIGH (ref 8–23)
CO2: 25 mmol/L (ref 22–32)
Calcium: 9.1 mg/dL (ref 8.9–10.3)
Chloride: 95 mmol/L — ABNORMAL LOW (ref 98–111)
Creatinine, Ser: 8.74 mg/dL — ABNORMAL HIGH (ref 0.44–1.00)
GFR, Estimated: 5 mL/min — ABNORMAL LOW (ref >60)
Glucose, Bld: 116 mg/dL — ABNORMAL HIGH (ref 70–99)
Potassium: 4.3 mmol/L (ref 3.5–5.1)
Sodium: 132 mmol/L — ABNORMAL LOW (ref 135–145)

## 2024-03-20 LAB — CBC
HCT: 30.9 % — ABNORMAL LOW (ref 36.0–46.0)
Hemoglobin: 10 g/dL — ABNORMAL LOW (ref 12.0–15.0)
MCH: 30 pg (ref 26.0–34.0)
MCHC: 32.4 g/dL (ref 30.0–36.0)
MCV: 92.8 fL (ref 80.0–100.0)
Platelets: 155 K/uL (ref 150–400)
RBC: 3.33 MIL/uL — ABNORMAL LOW (ref 3.87–5.11)
RDW: 14.8 % (ref 11.5–15.5)
WBC: 17.2 K/uL — ABNORMAL HIGH (ref 4.0–10.5)
nRBC: 0 % (ref 0.0–0.2)

## 2024-03-20 LAB — GLUCOSE, CAPILLARY
Glucose-Capillary: 102 mg/dL — ABNORMAL HIGH (ref 70–99)
Glucose-Capillary: 123 mg/dL — ABNORMAL HIGH (ref 70–99)

## 2024-03-20 LAB — PHOSPHORUS: Phosphorus: 3.6 mg/dL (ref 2.5–4.6)

## 2024-03-20 MED ORDER — SODIUM CHLORIDE 0.9 % IV SOLN
12.5000 mg | Freq: Four times a day (QID) | INTRAVENOUS | Status: DC | PRN
  Administered 2024-03-20 – 2024-03-25 (×3): 12.5 mg via INTRAVENOUS
  Filled 2024-03-20 (×2): qty 12.5
  Filled 2024-03-20 (×2): qty 0.5

## 2024-03-20 NOTE — Progress Notes (Signed)
 Progress Note   Patient: Tracy Jennings FMW:[id number] DOB: 05/21/1956 DOA: 03/05/2024     13 DOS: the patient was seen and examined on 03/20/2024   Brief hospital course:  Tracy Jennings is a 68 year old female with ESRD on HD MWF, HTN, HLD, DM, PVD, dementia, chronic recurrent pancreatitis, stroke, cervical cancer, HIT, who presents with slurred speech, left facial spasm, left-sided weakness.  Patient had syncopal episode on 6/23 and was seen in the ED.  At that time she was found to have O2 desaturation to 80% due to pulmonary edema on chest x-ray as well as hyperkalemia with a potassium of 6.2.  She had negative head CT during that admission.  She received hemodialysis and then was discharged home.  Her last known normal was 6/24 at 1700.  Upon arrival to the ED patient's confusion had resolved entirely.  Labs in the ED mostly unremarkable, potassium 4.6, creatinine consistent with ESRD.  Blood pressure was found to be 252/79.  Patient was placed in telemetry bed for observation.  Code stroke was called.Blood pressure resolved.  Head CT was without acute intracranial abnormality.  Chronic infarct seen on right parietal lobe and extensive chronic microvascular ischemic changes with remote lacunar infarcts in the right caudate and right thalamus appreciated.  Patient was admitted for workup of presumed TIA.  LDL 96, hemoglobin A1c 5.1%.  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  MRI brain revealed chronic encephalomalacia within the right parietal lobe and advanced diffuse cerebral white matter disease.  Patient underwent carotid Dopplers which revealed elevated velocity within the distal right internal carotid artery suggesting stenosis of 50 to 69% though no significant plaque identified and thus this may be artifactual. Patient underwent hemodialysis on 6/25 but left her session early AMA.she attempted to leave the hospital AMA but during this event began hallucinating  and loss capacity.  Psychiatry was consulted and met with the patient on 6/26 and confirms acute delirium, currently the patient does not have capacity to make medical decisions.  She has been started on Seroquel  for presumed Lewy body dementia.  She has had persistent agitation and Seroquel  has been slowly increased. She is currently medically stable and undergoing routine HD while awaiting safe dispo. TOC is consulted.    BP is labile and high.  Family voiced concern about isosorbide  mononitrate.  Nephrology discontinued isosorbide  and possibly add some other medications.  Patient is currently on losartan , Coreg , clonidine , diltiazem , doxazosin  and the spironolactone .   7/6.  Case discussed with nephrology and switching clonidine  pills over to clonidine  patch.  Imdur  discontinued the day prior.  Family interested in taking patient home rather than going to rehab 7/7.  Patient seen on dialysis and blood pressure on the lower side.  Continue to monitor closely.  Patient complains of pains and was given Tylenol . 7/8.  Patient did not sleep last night.  She was able to answer questions and follow commands for me this morning.  Was sleeping during the day.   I assumed care on 03/20/24.  Pt had dialysis this AM.  Patient remains medically stable for discharge, awaiting safe dispo plan to SNF versus home with 24/7 family/caregiver support in the home.   Assessment and Plan:  Hypertensive urgency BP's have improved, labile during dialysis.  --Continue clonidine  patch, Coreg , Cardizem  CD, Cardura , losartan , spironolactone  --Titrate regimen as needed  Acute delirium With hallucinations.  Issues with wake/sleep schedule since admission, started on Seroquel  with improvement.  Patient may do  better in her home environment rather than in the hospital.  Per Dr. Josette, brother planned to speak with other family members about potential for taking patient home with 24/7 care and home health  services. --Continued Seroquel  25 mg TID + 75 mg at bedtime --PRN Seroquel  also available for uncontrolled agitation/anxiety.  Dementia with behavioral disturbance (HCC) Patient on Seroquel  and as needed Haldol .   Patient was seen by psychiatry does not have the capacity to leave AMA.   Family discussing plans to take her home rather than SNF.  ESRD (end stage renal disease) [facility]) Nephrology following.  On dialysis Monday Wednesday Friday  Type II diabetes mellitus with renal manifestations (HCC) Last hemoglobin A1c 5.1  Anemia in ESRD (end-stage renal disease) (HCC) Last hemoglobin 10.8  Hyperlipidemia Continue Lipitor  TIA (transient ischemic attack) Ruled out.  MRI of the brain shows chronic encephalomalacia within the right parietal lobe and advanced diffuse cerebral white matter disease, carotid Doppler showing elevated velocity in the distal right internal carotid suggesting stenosis of 50 to 69%, EEG showing cortical dysfunction arising from the right temporal parietal region secondary to underlying structural abnormality no seizure or left to form discharges, EF normal range.  Hyponatremia Dialysis to manage      Subjective: Pt seen in dialysis today.  She reports a crick in her neck, pointing to the right side.  She otherwise reports feeling fine, no complaints, asking to go home.   No acute events reported.  Physical Exam: Vitals:   03/20/24 1200 03/20/24 1235 03/20/24 1238 03/20/24 1257  BP: (!) 123/47 (!) 83/58 (!) 134/99   Pulse: 72 67 76   Resp: 18 18 19    Temp:  (!) 97.5 F (36.4 C) 97.8 F (36.6 C)   TempSrc:      SpO2: 100% 100% 100%   Weight:    51.1 kg  Height:       General exam: awake, alert, no acute distress, receiving dialysis HEENT: hypertonic right-sided para-cervical spinal muscles Respiratory system: CTAB, no wheezes, rales or rhonchi, normal respiratory effort. Cardiovascular system: normal S1/S2, RRR Gastrointestinal system: soft, NT,  ND Central nervous system: A&O x self and hospital. no gross focal neurologic deficits, normal speech Skin: dry, intact, normal temperature Psychiatry: normal mood, congruent affect, abnormal judgement and insight    Data Reviewed:  Notable labs -- Na 132, Bl 95, glucose 115, BUN 42, Cr 8.74  Family Communication: None present, will attempt to call as time allows this afternoon.  Disposition: Status is: Inpatient Remains inpatient appropriate because: awaiting dispo to SNF versus home with [facility] and 24/7 family caregiver support - family discussing plans.    Planned Discharge Destination: SNF vs HH    Time spent: 42 minutes  Author: Burnard DELENA Cunning, DO 03/20/2024 12:57 PM  For on call review [email or web address].

## 2024-03-20 NOTE — Progress Notes (Signed)
 Pt has returned to unit from Dialysis. Pt is stable.

## 2024-03-20 NOTE — Progress Notes (Signed)
 [facility]  ROUNDING NOTE   Subjective:   Tracy Jennings is a 68 year old female with past medical conditions including dementia, hypertension, diabetes, hyperlipidemia, cervical cancer, recurrent pancreatitis, PVD, and end-stage renal disease on hemodialysis.  Patient presents to the emergency department after experiencing slurred speech, left-sided facial spasms and weakness.  Patient has been admitted on 03/05/2024 for TIA (transient ischemic attack) [G45.9] Hallucination [R44.3]  Patient is known to our practice and receives outpatient dialysis treatments at [facility] on a MWF schedule, supervised by Dr. Dennise.   Update:  Patient seen and evaluated during dialysis   HEMODIALYSIS FLOWSHEET:  Blood Flow Rate (mL/min): 299 mL/min Arterial Pressure (mmHg): -113.93 mmHg Venous Pressure (mmHg): 178.98 mmHg TMP (mmHg): 21.01 mmHg Ultrafiltration Rate (mL/min): 773 mL/min Dialysate Flow Rate (mL/min): 300 ml/min Dialysis Fluid Bolus: Normal Saline Bolus Amount (mL): 300 mL  Resting comfortably   Objective:  Vital signs in last 24 hours:  Temp:  [98 F (36.7 C)-98.9 F (37.2 C)] 98.4 F (36.9 C) (07/09 0818) Pulse Rate:  [67-79] 79 (07/09 1130) Resp:  [15-22] 20 (07/09 1130) BP: (116-176)/(43-90) 133/79 (07/09 1130) SpO2:  [93 %-100 %] 100 % (07/09 1130) Weight:  [41.4 kg] 41.4 kg (07/09 0818)  Weight change:  Filed Weights   03/18/24 0832 03/18/24 1221 03/20/24 0818  Weight: 53.4 kg 51.9 kg 41.4 kg    Intake/Output: I/O last 3 completed shifts: In: 120 [P.O.:120] Out: -    Intake/Output this shift:  No intake/output data recorded.  Physical Exam: General: NAD, laying in bed  Head: Normocephalic, atraumatic. Moist oral mucosal membranes  Eyes: Anicteric  Neck: Supple  Lungs:  Clear to auscultation, normal effort  Heart: Regular rate and rhythm  Abdomen:  Soft, nontender  Extremities: No peripheral edema.  Neurologic: Awake, alert   Skin: Warm,dry, no rash  Access: Left aVF    Basic Metabolic Panel: Recent Labs  Lab 03/15/24 0800 03/16/24 1515 03/20/24 0900  NA 131* 131* 132*  K 4.3 3.8 4.3  CL 90* 90* 95*  CO2 27 31 25   GLUCOSE 104* 125* 116*  BUN 41* 23 42*  CREATININE 7.95* 5.57* 8.74*  CALCIUM  9.0 8.6* 9.1  PHOS 5.2*  --   --     Liver Function Tests: Recent Labs  Lab 03/15/24 0800  ALBUMIN 3.4*   No results for input(s): LIPASE, AMYLASE in the last 168 hours. No results for input(s): AMMONIA in the last 168 hours.  CBC: Recent Labs  Lab 03/15/24 0417 03/17/24 0457  WBC 4.2 5.2  HGB 10.4* 10.8*  HCT 32.0* 33.8*  MCV 92.5 92.6  PLT 192 188    Cardiac Enzymes: No results for input(s): CKTOTAL, CKMB, CKMBINDEX, TROPONINI in the last 168 hours.  BNP: Invalid input(s): POCBNP  CBG: Recent Labs  Lab 03/17/24 0745 03/18/24 0726 03/18/24 2042 03/19/24 0739 03/20/24 0737  GLUCAP 146* 146* 113* 121* 102*    Microbiology: Results for orders placed or performed during the hospital encounter of 09/14/22  Resp panel by RT-PCR (RSV, Flu A&B, Covid) Anterior Nasal Swab     Status: None   Collection Time: 09/14/22  2:45 AM   Specimen: Anterior Nasal Swab  Result Value Ref Range Status   SARS Coronavirus 2 by RT PCR NEGATIVE NEGATIVE Final    Comment: (NOTE) SARS-CoV-2 target nucleic acids are NOT DETECTED.  The SARS-CoV-2 RNA is generally detectable in upper respiratory specimens during the acute phase of infection. The lowest concentration of SARS-CoV-2 viral copies  this assay can detect is 138 copies/mL. A negative result does not preclude SARS-Cov-2 infection and should not be used as the sole basis for treatment or other patient management decisions. A negative result may occur with  improper specimen collection/handling, submission of specimen other than nasopharyngeal swab, presence of viral mutation(s) within the areas targeted by this assay, and inadequate  number of viral copies(<138 copies/mL). A negative result must be combined with clinical observations, patient history, and epidemiological information. The expected result is Negative.  Fact Sheet for Patients:  BloggerCourse.com  Fact Sheet for Healthcare Providers:  SeriousBroker.it  This test is no t yet approved or cleared by the [place]  FDA and  has been authorized for detection and/or diagnosis of SARS-CoV-2 by FDA under an Emergency Use Authorization (EUA). This EUA will remain  in effect (meaning this test can be used) for the duration of the COVID-19 declaration under Section 564(b)(1) of the Act, 21 U.S.C.section 360bbb-3(b)(1), unless the authorization is terminated  or revoked sooner.       Influenza A by PCR NEGATIVE NEGATIVE Final   Influenza B by PCR NEGATIVE NEGATIVE Final    Comment: (NOTE) The Xpert Xpress SARS-CoV-2/FLU/RSV plus assay is intended as an aid in the diagnosis of influenza from Nasopharyngeal swab specimens and should not be used as a sole basis for treatment. Nasal washings and aspirates are unacceptable for Xpert Xpress SARS-CoV-2/FLU/RSV testing.  Fact Sheet for Patients: BloggerCourse.com  Fact Sheet for Healthcare Providers: SeriousBroker.it  This test is not yet approved or cleared by the [place]  FDA and has been authorized for detection and/or diagnosis of SARS-CoV-2 by FDA under an Emergency Use Authorization (EUA). This EUA will remain in effect (meaning this test can be used) for the duration of the COVID-19 declaration under Section 564(b)(1) of the Act, 21 U.S.C. section 360bbb-3(b)(1), unless the authorization is terminated or revoked.     Resp Syncytial Virus by PCR NEGATIVE NEGATIVE Final    Comment: (NOTE) Fact Sheet for Patients: BloggerCourse.com  Fact Sheet for Healthcare  Providers: SeriousBroker.it  This test is not yet approved or cleared by the [place]  FDA and has been authorized for detection and/or diagnosis of SARS-CoV-2 by FDA under an Emergency Use Authorization (EUA). This EUA will remain in effect (meaning this test can be used) for the duration of the COVID-19 declaration under Section 564(b)(1) of the Act, 21 U.S.C. section 360bbb-3(b)(1), unless the authorization is terminated or revoked.  Performed at [facility], [address] [place], [place] [address]     Coagulation Studies: No results for input(s): LABPROT, INR in the last 72 hours.   Urinalysis: No results for input(s): COLORURINE, LABSPEC, PHURINE, GLUCOSEU, HGBUR, BILIRUBINUR, KETONESUR, PROTEINUR, UROBILINOGEN, NITRITE, LEUKOCYTESUR in the last 72 hours.  Invalid input(s): APPERANCEUR    Imaging: No results found.    Medications:     aspirin   300 mg Rectal Daily   Or   aspirin   325 mg Oral Daily   atorvastatin   40 mg Oral Daily   carvedilol   25 mg Oral BID WC   Chlorhexidine  Gluconate Cloth  6 each Topical Daily   cloNIDine   0.3 mg Transdermal Weekly   diltiazem   120 mg Oral Daily   doxazosin   4 mg Oral Daily   feeding supplement (NEPRO CARB STEADY)  237 mL Oral TID BM   losartan   100 mg Oral Daily   polyethylene glycol  17 g Oral Daily   QUEtiapine   25 mg Oral TID  QUEtiapine   75 mg Oral Daily   sevelamer  carbonate  1,600 mg Oral TID WC   sodium chloride  flush  10-40 mL Intracatheter Q12H   spironolactone   25 mg Oral Daily   acetaminophen  **OR** acetaminophen  (TYLENOL ) oral liquid 160 mg/5 mL **OR** acetaminophen , haloperidol  lactate, hydrALAZINE , hydrALAZINE , labetalol , LORazepam , ondansetron  (ZOFRAN ) IV, QUEtiapine , sodium chloride  flush, sodium chloride  flush, traZODone   Assessment/ Plan:  Ms. LULABELLE DESTA is a 68 y.o.  female with past medical conditions including end stage  renal disease on hemodialysis, dementia, hypertension, diabetes, hyperlipidemia, cervical cancer, recurrent pancreatitis, PVD, and end-stage renal disease on hemodialysis.  Patient presents to the emergency department after experiencing slurred speech, left-sided facial spasms and weakness.  Patient has been admitted for TIA (transient ischemic attack) [G45.9] Hallucination [R44.3]  [facility]/MWF/Lt AVG    End-stage renal disease on hemodialysis. - Receiving dialysis today, UF 1.5 as tolerated. Next treatment scheduled for Friday.   Transient ischemic attack, reporting symptoms of slurred speech, involuntary left-sided facial spasms and weakness.  Neurology consulted, TIA versus partial complex seizure.  Brain imaging negative for acute infarct.  Carotid ultrasound shows right-sided 50 to 60% stenosis without plaque.  Symptoms resolved.    3.  Hypertension: hypertensive urgency on admission.  - continue losartan , carvedilol , clonidine , diltiazem , doxazosin , and spironolactone .  blood pressure severely elevated on admission.    - discontinued isosorbide  mononitrate due to family concerns.   - Blood pressure 151/43 during dialysis  4. Secondary Hyperparathyroidism:  - continue sevelamer   - Calcium  accepted. Awaiting phos  5. Anemia with chronic kidney disease: hemoglobin 10.8. Mircera as outpatient. Holding ESA due to acute ischemic event.   -Hgb stable, 10.8 at last check      LOS: 13 Shyheim Tanney [phone] PM

## 2024-03-20 NOTE — Plan of Care (Signed)
  Problem: Ischemic Stroke/TIA Tissue Perfusion: Goal: Complications of ischemic stroke/TIA will be minimized Outcome: Progressing   Problem: Health Behavior/Discharge Planning: Goal: Goals will be collaboratively established with patient/family Outcome: Progressing   Problem: Nutrition: Goal: Risk of aspiration will decrease Outcome: Progressing   Problem: Clinical Measurements: Goal: Ability to maintain clinical measurements within normal limits will improve Outcome: Progressing Goal: Will remain free from infection Outcome: Progressing Goal: Cardiovascular complication will be avoided Outcome: Progressing   Problem: Safety: Goal: Ability to remain free from injury will improve Outcome: Progressing   Problem: Skin Integrity: Goal: Risk for impaired skin integrity will decrease Outcome: Progressing

## 2024-03-20 NOTE — Progress Notes (Signed)
 Pt is off unit to Dialysis.

## 2024-03-20 NOTE — Progress Notes (Signed)
 PT Cancellation Note  Patient Details Name: Tracy Jennings MRN: [id number] DOB: 08-18-1956   Cancelled Treatment:    Reason Eval/Treat Not Completed: Patient at procedure or test/unavailable Patient off unit for HD. Will re-attempt at later time/date  Orlando Thalmann 03/20/2024, 8:43 AM

## 2024-03-21 ENCOUNTER — Inpatient Hospital Stay

## 2024-03-21 DIAGNOSIS — I160 Hypertensive urgency: Secondary | ICD-10-CM | POA: Diagnosis not present

## 2024-03-21 LAB — GLUCOSE, CAPILLARY
Glucose-Capillary: 140 mg/dL — ABNORMAL HIGH (ref 70–99)
Glucose-Capillary: 129 mg/dL — ABNORMAL HIGH (ref 70–99)
Glucose-Capillary: 121 mg/dL — ABNORMAL HIGH (ref 70–99)

## 2024-03-21 NOTE — Progress Notes (Signed)
 Progress Note   Patient: Tracy Jennings FMW:[id number] DOB: [birth year] DOA: 03/05/2024     14 DOS: the patient was seen and examined on 03/21/2024   Brief hospital course:  Tracy Jennings is a 68 year old female with ESRD on HD MWF, HTN, HLD, DM, PVD, dementia, chronic recurrent pancreatitis, stroke, cervical cancer, HIT, who presents with slurred speech, left facial spasm, left-sided weakness.  Patient had syncopal episode on 6/23 and was seen in the ED.  At that time she was found to have O2 desaturation to 80% due to pulmonary edema on chest x-ray as well as hyperkalemia with a potassium of 6.2.  She had negative head CT during that admission.  She received hemodialysis and then was discharged home.  Her last known normal was 6/24 at 1700.  Upon arrival to the ED patient's confusion had resolved entirely.  Labs in the ED mostly unremarkable, potassium 4.6, creatinine consistent with ESRD.  Blood pressure was found to be 252/79.  Patient was placed in telemetry bed for observation.  Code stroke was called.Blood pressure resolved.  Head CT was without acute intracranial abnormality.  Chronic infarct seen on right parietal lobe and extensive chronic microvascular ischemic changes with remote lacunar infarcts in the right caudate and right thalamus appreciated.  Patient was admitted for workup of presumed TIA.  LDL 96, hemoglobin A1c 5.1%.  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  MRI brain revealed chronic encephalomalacia within the right parietal lobe and advanced diffuse cerebral white matter disease.  Patient underwent carotid Dopplers which revealed elevated velocity within the distal right internal carotid artery suggesting stenosis of 50 to 69% though no significant plaque identified and thus this may be artifactual. Patient underwent hemodialysis on 6/25 but left her session early AMA.she attempted to leave the hospital AMA but during this event began hallucinating  and loss capacity.  Psychiatry was consulted and met with the patient on 6/26 and confirms acute delirium, currently the patient does not have capacity to make medical decisions.  She has been started on Seroquel  for presumed Lewy body dementia.  She has had persistent agitation and Seroquel  has been slowly increased. She is currently medically stable and undergoing routine HD while awaiting safe dispo. TOC is consulted.    BP is labile and high.  Family voiced concern about isosorbide  mononitrate.  Nephrology discontinued isosorbide  and possibly add some other medications.  Patient is currently on losartan , Coreg , clonidine , diltiazem , doxazosin  and the spironolactone .   7/6.  Case discussed with nephrology and switching clonidine  pills over to clonidine  patch.  Imdur  discontinued the day prior.  Family interested in taking patient home rather than going to rehab 7/7.  Patient seen on dialysis and blood pressure on the lower side.  Continue to monitor closely.  Patient complains of pains and was given Tylenol . 7/8.  Patient did not sleep last night.  She was able to answer questions and follow commands for me this morning.  Was sleeping during the day.   I assumed care on 03/20/24.   7/9 - Pt had dialysis. Otherwise uneventful day. 7/10 - new onset nausea/vomiting, leukocytosis, no tolerating PO meds, BP's uncontrolled   Assessment and Plan:  Hypertensive urgency BP's have improved, labile during dialysis.  --Continue clonidine  patch, Coreg , Cardizem  CD, Cardura , losartan , spironolactone  --Titrate regimen as needed  Leukocytosis - WBC elevated to 17k, unclear cause but pt is having nausea/vomiting --Check portable chest and abdominal x-rays --Monitor CBC  --Monitor clinically for s/sx's  of infection  Nausea and Vomiting - reported overnight 7/9-10 and morning of 7/10 pt vomited after PO meds. She is having BM's, unlikely obstruction.  RN documented emesis was undigested food,  ?gastroparesis. --PRN IV antiemetics --Abdominal x-ray  Acute delirium With hallucinations.  Issues with wake/sleep schedule since admission, started on Seroquel  with improvement.  Patient may do better in her home environment rather than in the hospital.  Per Dr. Josette, brother planned to speak with other family members about potential for taking patient home with 24/7 care and home health services. --Continued Seroquel  25 mg TID + 75 mg at bedtime --PRN Seroquel  also available for uncontrolled agitation/anxiety.  Dementia with behavioral disturbance (HCC) Patient on Seroquel  and as needed Haldol .   Patient was seen by psychiatry does not have the capacity to leave AMA.   Family discussing plans to take her home rather than SNF.  ESRD (end stage renal disease) [facility]) Nephrology following.  On dialysis Monday Wednesday Friday  Type II diabetes mellitus with renal manifestations (HCC) Last hemoglobin A1c 5.1  Anemia in ESRD (end-stage renal disease) (HCC) Last hemoglobin 10.8  Hyperlipidemia Continue Lipitor  TIA (transient ischemic attack) Ruled out.  MRI of the brain shows chronic encephalomalacia within the right parietal lobe and advanced diffuse cerebral white matter disease, carotid Doppler showing elevated velocity in the distal right internal carotid suggesting stenosis of 50 to 69%, EEG showing cortical dysfunction arising from the right temporal parietal region secondary to underlying structural abnormality no seizure or left to form discharges, EF normal range.  Hyponatremia Dialysis to manage      Subjective: Pt seen at bedside this AM, laying on her side awake but minimally interactive.  Nursing staff overnight and this AM reported pt having nausea and vomiting.  Last night, this improved after pt had a BM.  Recurrent today and pt threw up her PO medications.  Given Zofran .  Appears emesis has been undigested food.  Pt does not engage in answering questions about her  symptoms.   Physical Exam: Vitals:   03/21/24 0649 03/21/24 0732 03/21/24 1019 03/21/24 1204  BP: (!) 202/63 (!) 199/79 (!) 157/48 (!) 149/68  Pulse: 85 84 74 75  Resp: 18 19 18    Temp: 98.1 F (36.7 C) 98.6 F (37 C) 98.8 F (37.1 C)   TempSrc:   Oral   SpO2: 98% 97% 98%   Weight:      Height:       General exam: awake, minimally interactive, laying on left side HEENT: hearing grossly normal Respiratory system: CTAB, no wheezes, rales or rhonchi, normal respiratory effort. Cardiovascular system: normal S1/S2, RRR Gastrointestinal system: soft, NT, ND Central nervous system: A&O x 2+. no gross focal neurologic deficits, minimal but normal speech Skin: dry, intact, normal temperature Psychiatry: normal mood, congruent affect   Data Reviewed:  Notable labs -- Last BMP Na 132, Bl 95, glucose 115, BUN 42, Cr 8.74  CBC with new leukocytosis 17.2k, stable Hbg 10.0  CBG's at goal  Family Communication: None present, will attempt to call as time allows this afternoon.   Disposition: Status is: Inpatient Remains inpatient appropriate because: nausea & vomiting this AM, not tolerating PO meds.    Planned Discharge Destination: SNF vs HH with 24/7 family caregiver support     Time spent: 40 minutes  Author: Burnard DELENA Cunning, DO 03/21/2024 1:09 PM  For on call review [email or web address].

## 2024-03-21 NOTE — Plan of Care (Signed)

## 2024-03-21 NOTE — Plan of Care (Signed)
 Pt remains confused overnight. Pt  had multiple episodes of vomiting after complaining of abdominal cramping and the need to have BM. Pt has had multiple BM's overnight and cramping/pain subsided. Pt required zofran  and phenergan  to alleviate vomiting and nausea. Pt was able to sleep for long periods without agitation or combativeness.     Problem: Ischemic Stroke/TIA Tissue Perfusion: Goal: Complications of ischemic stroke/TIA will be minimized Outcome: Progressing   Problem: Self-Care: Goal: Ability to participate in self-care as condition permits will improve Outcome: Progressing   Problem: Self-Care: Goal: Ability to communicate needs accurately will improve Outcome: Progressing   Problem: Nutrition: Goal: Risk of aspiration will decrease Outcome: Progressing   Problem: Education: Goal: Knowledge of General Education information will improve Description: Including pain rating scale, medication(s)/side effects and non-pharmacologic comfort measures Outcome: Progressing   Problem: Activity: Goal: Risk for activity intolerance will decrease Outcome: Progressing   Problem: Coping: Goal: Level of anxiety will decrease Outcome: Progressing   Problem: Elimination: Goal: Will not experience complications related to bowel motility Outcome: Progressing Goal: Will not experience complications related to urinary retention Outcome: Progressing   Problem: Safety: Goal: Ability to remain free from injury will improve Outcome: Progressing   Problem: Pain Managment: Goal: General experience of comfort will improve and/or be controlled Outcome: Progressing   Problem: Skin Integrity: Goal: Risk for impaired skin integrity will decrease Outcome: Progressing

## 2024-03-21 NOTE — Progress Notes (Cosign Needed)
 Patient had multiple episodes of vomiting and nausea today. Medications were administered as documented in the [facility]. Patient not complaining of nausea at this time. Was able to take medications in pudding and tolerated well. Patient in bed with side rails up and bed in lowest position with call bell in reach.

## 2024-03-21 NOTE — Evaluation (Signed)
 Physical Therapy Re-Evaluation Patient Details Name: Tracy Jennings MRN: [id number] DOB: May 04, 1956 Today's Date: 03/21/2024  History of Present Illness  Pt is a 68 year old female presents with slurred speech,  left facial spasm, left sided weakness.    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  Clinical Impression  PT re-evaluation performed d/t extended hospitalization.  Pt resting in chair upon PT arrival with eyes closed; pt easily woken with vc's but tended to have eyes closed approximately 75% of session requiring intermittent cueing to open her eyes.  Currently pt is mod assist x2 to stand from recliner up to RW x2 trials (pt able to stand for a couple of minutes with min assist while NT assisted with peri-care d/t bowel incontinence) and min assist x2 to take 1 step in place with B LE's (pt then stopped taking steps despite cueing to continue).  Pt repositioned in chair end of session with all needs in reach and chair alarm on.  Pt would currently benefit from skilled PT to address noted impairments and functional limitations (see below for any additional details).  Upon hospital discharge, pt would benefit from ongoing therapy.  PT POC reviewed and updated.     If plan is discharge home, recommend the following: Assistance with cooking/housework;Assist for transportation;Help with stairs or ramp for entrance;Supervision due to cognitive status;Two people to help with walking and/or transfers;Two people to help with bathing/dressing/bathroom   Can travel by private vehicle   No    Equipment Recommendations Rolling walker (2 wheels)  Recommendations for Other Services       Functional Status Assessment Patient has had a recent decline in their functional status and demonstrates the ability to make significant improvements in function in a reasonable and  predictable amount of time.     Precautions / Restrictions Precautions Precautions: Fall Recall of Precautions/Restrictions: Impaired Precaution/Restrictions Comments: L UE AV fistula; R chest port Restrictions Weight Bearing Restrictions Per Provider Order: No      Mobility  Bed Mobility               General bed mobility comments: Deferred (pt in recliner beginning/end of session)    Transfers Overall transfer level: Needs assistance Equipment used: Rolling walker (2 wheels) Transfers: Sit to/from Stand Sit to Stand: Mod assist, +2 physical assistance           General transfer comment: x2 trials standing from recliner; vc's and tactile cues for UE placement and overall technique; assist to initiate and come to full stand (vc's for upright posture) and to control descent sitting    Ambulation/Gait Ambulation/Gait assistance: Min assist, +2 physical assistance Gait Distance (Feet):  (pt took 1 step in place B LE's but did not take any more steps with cueing) Assistive device: Rolling walker (2 wheels)   Gait velocity: decreased     General Gait Details: increased effort to take a step with L LE; decreased B LE foot clearance; forward flexed posture  Stairs            Wheelchair Mobility     Tilt Bed    Modified Rankin (Stroke Patients Only)       Balance Overall balance assessment: Needs assistance Sitting-balance support: No upper extremity supported, Feet supported Sitting balance-Leahy Scale: Poor Sitting balance - Comments: Pt able to maintain static sitting balance briefly before fatiguing and leaning back in recliner   Standing balance  support: Bilateral upper extremity supported, During functional activity, Reliant on assistive device for balance Standing balance-Leahy Scale: Poor Standing balance comment: min assist for static standing balance; B UE support on RW                             Pertinent Vitals/Pain Pain  Assessment Pain Assessment: Faces Faces Pain Scale: No hurt Pain Intervention(s): Limited activity within patient's tolerance, Monitored during session, Repositioned HR and SpO2 sats on room air stable during session.    Home Living Family/patient expects to be discharged to:: Unsure Living Arrangements: Alone Available Help at Discharge: Family;Available PRN/intermittently Type of Home: House Home Access: Stairs to enter Entrance Stairs-Rails: Doctor, general practice of Steps: 5   Home Layout: One level Home Equipment: Educational psychologist (4 wheels) Additional Comments: Above information per chart (will need to confirm PLOF; no family present during session)    Prior Function Prior Level of Function : Patient poor historian/Family not available             Mobility Comments: Per chart pt was ambulatory with rollator ADLs Comments: Per chart pt's sister helps w groceries/meds     Extremity/Trunk Assessment   Upper Extremity Assessment Upper Extremity Assessment: Generalized weakness;Difficult to assess due to impaired cognition    Lower Extremity Assessment Lower Extremity Assessment: Generalized weakness;Difficult to assess due to impaired cognition    Cervical / Trunk Assessment Cervical / Trunk Assessment: Other exceptions Cervical / Trunk Exceptions: forward head/shoulders  Communication   Communication Communication: No apparent difficulties    Cognition Arousal: Lethargic Behavior During Therapy: Flat affect   PT - Cognitive impairments: History of cognitive impairments, No family/caregiver present to determine baseline                       PT - Cognition Comments: Pt oriented to name, DOB, and July 2025; pt did not know where she was and (once told she was in hospital) why she was in the hospital Following commands: Impaired Following commands impaired: Follows one step commands inconsistently, Follows one step commands with  increased time     Cueing Cueing Techniques: Verbal cues, Gestural cues, Tactile cues, Visual cues     General Comments  Nursing cleared pt for participation in physical therapy.  Pt agreeable to PT session.    Exercises     Assessment/Plan    PT Assessment Patient needs continued PT services  PT Problem List Decreased strength;Decreased activity tolerance;Decreased balance;Decreased mobility;Decreased cognition;Decreased knowledge of use of DME;Decreased safety awareness;Decreased knowledge of precautions       PT Treatment Interventions DME instruction;Gait training;Stair training;Functional mobility training;Therapeutic activities;Therapeutic exercise;Balance training;Cognitive remediation;Patient/family education    PT Goals (Current goals can be found in the Care Plan section)  Acute Rehab PT Goals Patient Stated Goal: to go home PT Goal Formulation: With patient Time For Goal Achievement: 04/04/24 Potential to Achieve Goals: Fair    Frequency Min 2X/week     Co-evaluation               AM-PAC PT 6 Clicks Mobility  Outcome Measure Help needed turning from your back to your side while in a flat bed without using bedrails?: A Little Help needed moving from lying on your back to sitting on the side of a flat bed without using bedrails?: A Lot Help needed moving to and from a bed to a chair (including a wheelchair)?: Total Help needed  standing up from a chair using your arms (e.g., wheelchair or bedside chair)?: Total Help needed to walk in hospital room?: Total Help needed climbing 3-5 steps with a railing? : Total 6 Click Score: 9    End of Session Equipment Utilized During Treatment: Gait belt Activity Tolerance: Patient limited by lethargy;Patient limited by fatigue Patient left: in chair;with call bell/phone within reach;with chair alarm set;Other (comment) (pillows under pt's bottom to improve comfort in recliner) Nurse Communication: Mobility  status;Precautions PT Visit Diagnosis: Muscle weakness (generalized) (M62.81);Difficulty in walking, not elsewhere classified (R26.2)    Time: 8840-8782 PT Time Calculation (min) (ACUTE ONLY): 18 min   Charges:   PT Evaluation $PT Re-evaluation: 1 Re-eval   PT General Charges $$ ACUTE PT VISIT: 1 Visit        Damien Caulk, PT 03/21/24, 3:59 PM

## 2024-03-21 NOTE — Progress Notes (Signed)
 [facility]  ROUNDING NOTE   Subjective:   Tracy Jennings is a 68 year old female with past medical conditions including dementia, hypertension, diabetes, hyperlipidemia, cervical cancer, recurrent pancreatitis, PVD, and end-stage renal disease on hemodialysis.  Patient presents to the emergency department after experiencing slurred speech, left-sided facial spasms and weakness.  Patient has been admitted on 03/05/2024 for TIA (transient ischemic attack) [G45.9] Hallucination [R44.3]  Patient is known to our practice and receives outpatient dialysis treatments at [facility] on a MWF schedule, supervised by Dr. Dennise.   Update: Patient seen laying in bed Drowsy but easily aroused Alert and oriented to self Room air  Objective:  Vital signs in last 24 hours:  Temp:  [97.5 F (36.4 C)-99.4 F (37.4 C)] 98.8 F (37.1 C) (07/10 1019) Pulse Rate:  [67-85] 74 (07/10 1019) Resp:  [16-20] 18 (07/10 1019) BP: (83-202)/(38-99) 157/48 (07/10 1019) SpO2:  [95 %-100 %] 98 % (07/10 1019) Weight:  [51.1 kg] 51.1 kg (07/09 1257)  Weight change:  Filed Weights   03/18/24 1221 03/20/24 0818 03/20/24 1257  Weight: 51.9 kg 41.4 kg 51.1 kg    Intake/Output: I/O last 3 completed shifts: In: 175.2 [P.O.:120; IV Piggyback:55.2] Out: 1500 [Other:1500]   Intake/Output this shift:  No intake/output data recorded.  Physical Exam: General: NAD, laying in bed  Head: Normocephalic, atraumatic. Moist oral mucosal membranes  Eyes: Anicteric  Neck: Supple  Lungs:  Clear to auscultation, normal effort  Heart: Regular rate and rhythm  Abdomen:  Soft, nontender  Extremities: No peripheral edema.  Neurologic: Awake, alert  Skin: Warm,dry, no rash  Access: Left aVF    Basic Metabolic Panel: Recent Labs  Lab 03/15/24 0800 03/16/24 1515 03/20/24 0900  NA 131* 131* 132*  K 4.3 3.8 4.3  CL 90* 90* 95*  CO2 27 31 25   GLUCOSE 104* 125* 116*  BUN 41* 23 42*   CREATININE 7.95* 5.57* 8.74*  CALCIUM  9.0 8.6* 9.1  PHOS 5.2*  --  3.6    Liver Function Tests: Recent Labs  Lab 03/15/24 0800  ALBUMIN 3.4*   No results for input(s): LIPASE, AMYLASE in the last 168 hours. No results for input(s): AMMONIA in the last 168 hours.  CBC: Recent Labs  Lab 03/15/24 0417 03/17/24 0457 03/20/24 1452  WBC 4.2 5.2 17.2*  HGB 10.4* 10.8* 10.0*  HCT 32.0* 33.8* 30.9*  MCV 92.5 92.6 92.8  PLT 192 188 155    Cardiac Enzymes: No results for input(s): CKTOTAL, CKMB, CKMBINDEX, TROPONINI in the last 168 hours.  BNP: Invalid input(s): POCBNP  CBG: Recent Labs  Lab 03/18/24 2042 03/19/24 0739 03/20/24 0737 03/20/24 2115 03/21/24 0728  GLUCAP 113* 121* 102* 123* 140*    Microbiology: Results for orders placed or performed during the hospital encounter of 09/14/22  Resp panel by RT-PCR (RSV, Flu A&B, Covid) Anterior Nasal Swab     Status: None   Collection Time: 09/14/22  2:45 AM   Specimen: Anterior Nasal Swab  Result Value Ref Range Status   SARS Coronavirus 2 by RT PCR NEGATIVE NEGATIVE Final    Comment: (NOTE) SARS-CoV-2 target nucleic acids are NOT DETECTED.  The SARS-CoV-2 RNA is generally detectable in upper respiratory specimens during the acute phase of infection. The lowest concentration of SARS-CoV-2 viral copies this assay can detect is 138 copies/mL. A negative result does not preclude SARS-Cov-2 infection and should not be used as the sole basis for treatment or other patient management decisions. A negative result  may occur with  improper specimen collection/handling, submission of specimen other than nasopharyngeal swab, presence of viral mutation(s) within the areas targeted by this assay, and inadequate number of viral copies(<138 copies/mL). A negative result must be combined with clinical observations, patient history, and epidemiological information. The expected result is Negative.  Fact Sheet for  Patients:  BloggerCourse.com  Fact Sheet for Healthcare Providers:  SeriousBroker.it  This test is no t yet approved or cleared by the [place]  FDA and  has been authorized for detection and/or diagnosis of SARS-CoV-2 by FDA under an Emergency Use Authorization (EUA). This EUA will remain  in effect (meaning this test can be used) for the duration of the COVID-19 declaration under Section 564(b)(1) of the Act, 21 U.S.C.section 360bbb-3(b)(1), unless the authorization is terminated  or revoked sooner.       Influenza A by PCR NEGATIVE NEGATIVE Final   Influenza B by PCR NEGATIVE NEGATIVE Final    Comment: (NOTE) The Xpert Xpress SARS-CoV-2/FLU/RSV plus assay is intended as an aid in the diagnosis of influenza from Nasopharyngeal swab specimens and should not be used as a sole basis for treatment. Nasal washings and aspirates are unacceptable for Xpert Xpress SARS-CoV-2/FLU/RSV testing.  Fact Sheet for Patients: BloggerCourse.com  Fact Sheet for Healthcare Providers: SeriousBroker.it  This test is not yet approved or cleared by the [place]  FDA and has been authorized for detection and/or diagnosis of SARS-CoV-2 by FDA under an Emergency Use Authorization (EUA). This EUA will remain in effect (meaning this test can be used) for the duration of the COVID-19 declaration under Section 564(b)(1) of the Act, 21 U.S.C. section 360bbb-3(b)(1), unless the authorization is terminated or revoked.     Resp Syncytial Virus by PCR NEGATIVE NEGATIVE Final    Comment: (NOTE) Fact Sheet for Patients: BloggerCourse.com  Fact Sheet for Healthcare Providers: SeriousBroker.it  This test is not yet approved or cleared by the [place]  FDA and has been authorized for detection and/or diagnosis of SARS-CoV-2 by FDA under an Emergency  Use Authorization (EUA). This EUA will remain in effect (meaning this test can be used) for the duration of the COVID-19 declaration under Section 564(b)(1) of the Act, 21 U.S.C. section 360bbb-3(b)(1), unless the authorization is terminated or revoked.  Performed at [facility], [address] [place], [place] [address]     Coagulation Studies: No results for input(s): LABPROT, INR in the last 72 hours.   Urinalysis: No results for input(s): COLORURINE, LABSPEC, PHURINE, GLUCOSEU, HGBUR, BILIRUBINUR, KETONESUR, PROTEINUR, UROBILINOGEN, NITRITE, LEUKOCYTESUR in the last 72 hours.  Invalid input(s): APPERANCEUR    Imaging: No results found.    Medications:    promethazine  (PHENERGAN ) injection (IM or IVPB) 12.5 mg (03/20/24 2258)    aspirin   300 mg Rectal Daily   Or   aspirin   325 mg Oral Daily   atorvastatin   40 mg Oral Daily   carvedilol   25 mg Oral BID WC   Chlorhexidine  Gluconate Cloth  6 each Topical Daily   cloNIDine   0.3 mg Transdermal Weekly   diltiazem   120 mg Oral Daily   doxazosin   4 mg Oral Daily   feeding supplement (NEPRO CARB STEADY)  237 mL Oral TID BM   losartan   100 mg Oral Daily   polyethylene glycol  17 g Oral Daily   QUEtiapine   25 mg Oral TID   QUEtiapine   75 mg Oral Daily   sevelamer  carbonate  1,600 mg Oral TID WC   sodium chloride  flush  10-40 mL Intracatheter Q12H   spironolactone   25 mg Oral Daily   acetaminophen  **OR** acetaminophen  (TYLENOL ) oral liquid 160 mg/5 mL **OR** acetaminophen , haloperidol  lactate, hydrALAZINE , hydrALAZINE , labetalol , LORazepam , ondansetron  (ZOFRAN ) IV, promethazine  (PHENERGAN ) injection (IM or IVPB), QUEtiapine , sodium chloride  flush, sodium chloride  flush, traZODone   Assessment/ Plan:  Tracy Jennings is a 68 y.o.  female with past medical conditions including end stage renal disease on hemodialysis, dementia, hypertension, diabetes, hyperlipidemia, cervical cancer,  recurrent pancreatitis, PVD, and end-stage renal disease on hemodialysis.  Patient presents to the emergency department after experiencing slurred speech, left-sided facial spasms and weakness.  Patient has been admitted for TIA (transient ischemic attack) [G45.9] Hallucination [R44.3]  [facility]/MWF/Lt AVG    End-stage renal disease on hemodialysis. - Dialysis received yesterday, UF 1.5 L achieved.  Next treatment scheduled for Friday.   Transient ischemic attack, reporting symptoms of slurred speech, involuntary left-sided facial spasms and weakness.  Neurology consulted, TIA versus partial complex seizure.  Brain imaging negative for acute infarct.  Carotid ultrasound shows right-sided 50 to 60% stenosis without plaque.  Symptoms resolved.    3.  Hypertension: hypertensive urgency on admission.  - continue losartan , carvedilol , clonidine , diltiazem , doxazosin , and spironolactone .  blood pressure severely elevated on admission.    - discontinued isosorbide  mononitrate due to family concerns.   - Blood pressure 157/48  4. Secondary Hyperparathyroidism:  - continue sevelamer  with meals  - Bone mineral is acceptable.  Will continue to monitor for now  5. Anemia with chronic kidney disease: Mircera as outpatient. Holding ESA due to acute ischemic event.   -Hgb 10.0.  No indication for ESA at this time.      LOS: 14 Dare Spillman 7/10/202510:39 AM

## 2024-03-22 DIAGNOSIS — I160 Hypertensive urgency: Secondary | ICD-10-CM | POA: Diagnosis not present

## 2024-03-22 LAB — CBC
HCT: 30.9 % — ABNORMAL LOW (ref 36.0–46.0)
Hemoglobin: 10 g/dL — ABNORMAL LOW (ref 12.0–15.0)
MCH: 30.5 pg (ref 26.0–34.0)
MCHC: 32.4 g/dL (ref 30.0–36.0)
MCV: 94.2 fL (ref 80.0–100.0)
Platelets: 173 K/uL (ref 150–400)
RBC: 3.28 MIL/uL — ABNORMAL LOW (ref 3.87–5.11)
RDW: 15.3 % (ref 11.5–15.5)
WBC: 14.9 K/uL — ABNORMAL HIGH (ref 4.0–10.5)
nRBC: 0 % (ref 0.0–0.2)

## 2024-03-22 LAB — RENAL FUNCTION PANEL
Albumin: 3 g/dL — ABNORMAL LOW (ref 3.5–5.0)
Anion gap: 14 (ref 5–15)
BUN: 48 mg/dL — ABNORMAL HIGH (ref 8–23)
CO2: 26 mmol/L (ref 22–32)
Calcium: 9 mg/dL (ref 8.9–10.3)
Chloride: 92 mmol/L — ABNORMAL LOW (ref 98–111)
Creatinine, Ser: 7.66 mg/dL — ABNORMAL HIGH (ref 0.44–1.00)
GFR, Estimated: 5 mL/min — ABNORMAL LOW (ref >60)
Glucose, Bld: 104 mg/dL — ABNORMAL HIGH (ref 70–99)
Phosphorus: 4.8 mg/dL — ABNORMAL HIGH (ref 2.5–4.6)
Potassium: 4.3 mmol/L (ref 3.5–5.1)
Sodium: 132 mmol/L — ABNORMAL LOW (ref 135–145)

## 2024-03-22 LAB — GLUCOSE, CAPILLARY
Glucose-Capillary: 96 mg/dL (ref 70–99)
Glucose-Capillary: 97 mg/dL (ref 70–99)
Glucose-Capillary: 83 mg/dL (ref 70–99)

## 2024-03-22 MED ORDER — CALCIUM CARBONATE ANTACID 500 MG PO CHEW
2.0000 | CHEWABLE_TABLET | Freq: Two times a day (BID) | ORAL | Status: DC | PRN
  Administered 2024-03-22 – 2024-03-31 (×4): 400 mg via ORAL
  Filled 2024-03-22 (×4): qty 2

## 2024-03-22 MED ORDER — DILTIAZEM HCL ER COATED BEADS 180 MG PO CP24
180.0000 mg | ORAL_CAPSULE | Freq: Every day | ORAL | Status: DC
  Administered 2024-03-22 – 2024-04-02 (×11): 180 mg via ORAL
  Filled 2024-03-22 (×11): qty 1

## 2024-03-22 NOTE — Progress Notes (Addendum)
 Progress Note   Patient: Tracy Jennings FMW:[id number] DOB: April 21, 1956 DOA: 03/05/2024     15 DOS: the patient was seen and examined on 03/22/2024   Brief hospital course:  Tracy Jennings is a 68 year old female with ESRD on HD MWF, HTN, HLD, DM, PVD, dementia, chronic recurrent pancreatitis, stroke, cervical cancer, HIT, who presents with slurred speech, left facial spasm, left-sided weakness.  Patient had syncopal episode on 6/23 and was seen in the ED.  At that time she was found to have O2 desaturation to 80% due to pulmonary edema on chest x-ray as well as hyperkalemia with a potassium of 6.2.  She had negative head CT during that admission.  She received hemodialysis and then was discharged home.  Her last known normal was 6/24 at 1700.  Upon arrival to the ED patient's confusion had resolved entirely.  Labs in the ED mostly unremarkable, potassium 4.6, creatinine consistent with ESRD.  Blood pressure was found to be 252/79.  Patient was placed in telemetry bed for observation.  Code stroke was called.Blood pressure resolved.  Head CT was without acute intracranial abnormality.  Chronic infarct seen on right parietal lobe and extensive chronic microvascular ischemic changes with remote lacunar infarcts in the right caudate and right thalamus appreciated.  Patient was admitted for workup of presumed TIA.  LDL 96, hemoglobin A1c 5.1%.  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  MRI brain revealed chronic encephalomalacia within the right parietal lobe and advanced diffuse cerebral white matter disease.  Patient underwent carotid Dopplers which revealed elevated velocity within the distal right internal carotid artery suggesting stenosis of 50 to 69% though no significant plaque identified and thus this may be artifactual. Patient underwent hemodialysis on 6/25 but left her session early AMA.she attempted to leave the hospital AMA but during this event began hallucinating  and loss capacity.  Psychiatry was consulted and met with the patient on 6/26 and confirms acute delirium, currently the patient does not have capacity to make medical decisions.  She has been started on Seroquel  for presumed Lewy body dementia.  She has had persistent agitation and Seroquel  has been slowly increased. She is currently medically stable and undergoing routine HD while awaiting safe dispo. TOC is consulted.    BP is labile and high.  Family voiced concern about isosorbide  mononitrate.  Nephrology discontinued isosorbide  and possibly add some other medications.  Patient is currently on losartan , Coreg , clonidine , diltiazem , doxazosin  and the spironolactone .   7/6.  Case discussed with nephrology and switching clonidine  pills over to clonidine  patch.  Imdur  discontinued the day prior.  Family interested in taking patient home rather than going to rehab 7/7.  Patient seen on dialysis and blood pressure on the lower side.  Continue to monitor closely.  Patient complains of pains and was given Tylenol . 7/8.  Patient did not sleep last night.  She was able to answer questions and follow commands for me this morning.  Was sleeping during the day.   I assumed care on 03/20/24.   7/9 - Pt had dialysis. Otherwise uneventful day. 7/10 - new onset nausea/vomiting, leukocytosis, no tolerating PO meds, BP's uncontrolled 7/11 - BP's still significantly elevated. Pt vomited her PO meds yesterday AM.    Assessment and Plan:  Hypertensive urgency BP's have improved, labile during dialysis.  --Continue clonidine  patch, Coreg , Cardizem  CD, Cardura , losartan , spironolactone  --Increase diltiazem  120 >> 180 mg --Titrate regimen as needed  Leukocytosis - WBC elevated to 17k,  unclear cause but pt is having nausea/vomiting Possibly reactive with N/V --CXR no pneumonia, abdominal x-ray moderate stool burden --Check blood cultures - pending, follow --Monitor CBC  --Monitor clinically for s/sx's of  infection  Nausea and Vomiting - reported overnight 7/9-10 and morning of 7/10 pt vomited after PO meds. She is having BM's, unlikely obstruction.  RN documented emesis was undigested food, ?gastroparesis. --PRN IV antiemetics --Abdominal x-ray 7/10 showed moderate stool burden throughout colon, no signs of obstruction  Acute delirium With hallucinations.  Issues with wake/sleep schedule since admission, started on Seroquel  with improvement.  Patient may do better in her home environment rather than in the hospital.  Per Dr. Josette, brother planned to speak with other family members about potential for taking patient home with 24/7 care and home health services. --Continued Seroquel  25 mg TID + 75 mg at bedtime --PRN Seroquel  also available for uncontrolled agitation/anxiety.  Dementia with behavioral disturbance (HCC) Patient on Seroquel  and as needed Haldol .   Patient was seen by psychiatry does not have the capacity to leave AMA.   Family discussing plans to take her home rather than SNF.  ESRD (end stage renal disease) [name]) Nephrology following.  On dialysis Monday Wednesday Friday  Type II diabetes mellitus with renal manifestations (HCC) Last hemoglobin A1c 5.1  Anemia in ESRD (end-stage renal disease) (HCC) Last hemoglobin 10.8  Hyperlipidemia Continue Lipitor  TIA (transient ischemic attack) Ruled out.  MRI of the brain shows chronic encephalomalacia within the right parietal lobe and advanced diffuse cerebral white matter disease, carotid Doppler showing elevated velocity in the distal right internal carotid suggesting stenosis of 50 to 69%, EEG showing cortical dysfunction arising from the right temporal parietal region secondary to underlying structural abnormality no seizure or left to form discharges, EF normal range.  Hyponatremia Dialysis to manage      Subjective: Pt seen in dialysis.  She reports upset stomach.  No nausea/vomiting reported yet today, was having  yesterday.  BP's remain elevated.  Pt does not engage in much conversation.   Physical Exam: Vitals:   03/22/24 1250 03/22/24 1259 03/22/24 1320 03/22/24 1321  BP: (!) 184/60 (!) 211/56 (!) 155/65   Pulse: 71 75 76 76  Resp: (!) 25 15 15 18   Temp:  (!) 97.4 F (36.3 C)    TempSrc:      SpO2: 100% 100% 99% 99%  Weight:   51.7 kg   Height:       General exam: awake, in dialysis HEENT: hearing grossly normal Respiratory system: on room air, normal respiratory effort. Cardiovascular system: RRR Gastrointestinal system: soft, NT, ND Central nervous system: A&O x 2. no gross focal neurologic deficits, minimal but normal speech Skin: dry, intact, normal temperature Psychiatry: normal mood, congruent affect   Data Reviewed:  Notable labs -- Na 132, Cl 92, glucose 104, BUN 48, Cr 7.66, phos 4.8, albumin 3.0  CBC with WBC 17.2 >> 14.9, stable Hbg 10.0  CBG's at goal  Family Communication: Attempted to call patient's sister this afternoon, did not leave unsecured voicemail per patient privacy. Will attempt again tomorrow.   Disposition: Status is: Inpatient Remains inpatient appropriate because: uncontrolled BP's, adjusting medications.  Monitoring nausea & vomiting for ongoing improvement, ensure tolerating PO meds.    Planned Discharge Destination: SNF vs HH with 24/7 family caregiver support     Time spent: 38 minutes  Author: Burnard DELENA Cunning, DO 03/22/2024 3:36 PM  For on call review [email or web address].

## 2024-03-22 NOTE — Progress Notes (Signed)
 PT Cancellation Note  Patient Details Name: Tracy Jennings MRN: [id number] DOB: 08/08/1956   Cancelled Treatment:    Reason Eval/Treat Not Completed: Patient at procedure or test/unavailable.  Pt currently off unit at dialysis.  Will re-attempt PT session at a later date/time.  Damien Caulk, PT 03/22/24, 12:10 PM

## 2024-03-22 NOTE — Progress Notes (Signed)
 PRN hydralazine  10 mg given for systolic blood pressure greater than 180. Patient's blood pressure is now 155/62 post treatment

## 2024-03-22 NOTE — Plan of Care (Signed)

## 2024-03-22 NOTE — Plan of Care (Signed)
  Problem: Education: Goal: Knowledge of secondary prevention will improve (MUST DOCUMENT ALL) Outcome: Progressing   Problem: Ischemic Stroke/TIA Tissue Perfusion: Goal: Complications of ischemic stroke/TIA will be minimized Outcome: Progressing   Problem: Self-Care: Goal: Ability to participate in self-care as condition permits will improve Outcome: Progressing   Problem: Nutrition: Goal: Risk of aspiration will decrease Outcome: Progressing

## 2024-03-22 NOTE — Progress Notes (Signed)
 [facility]  ROUNDING NOTE   Subjective:   Tracy Jennings is a 68 year old female with past medical conditions including dementia, hypertension, diabetes, hyperlipidemia, cervical cancer, recurrent pancreatitis, PVD, and end-stage renal disease on hemodialysis.  Patient presents to the emergency department after experiencing slurred speech, left-sided facial spasms and weakness.  Patient has been admitted on 03/05/2024 for TIA (transient ischemic attack) [G45.9] Hallucination [R44.3]  Patient is known to our practice and receives outpatient dialysis treatments at [facility] on a MWF schedule, supervised by Dr. Dennise.   Update: Patient seen and evaluated during dialysis   HEMODIALYSIS FLOWSHEET:  Blood Flow Rate (mL/min): 299 mL/min Arterial Pressure (mmHg): -85.04 mmHg Venous Pressure (mmHg): 239.38 mmHg TMP (mmHg): 15.35 mmHg Ultrafiltration Rate (mL/min): 543 mL/min Dialysate Flow Rate (mL/min): 299 ml/min Dialysis Fluid Bolus: Normal Saline Bolus Amount (mL): 300 mL  Alert Resting quietly during treatment  Objective:  Vital signs in last 24 hours:  Temp:  [98.1 F (36.7 C)-99.9 F (37.7 C)] 99.2 F (37.3 C) (07/11 0725) Pulse Rate:  [65-75] 70 (07/11 1030) Resp:  [13-25] 14 (07/11 1000) BP: (147-208)/(43-68) 181/64 (07/11 1030) SpO2:  [93 %-100 %] 100 % (07/11 1030) Weight:  [41.4 kg] 41.4 kg (07/11 0835)  Weight change:  Filed Weights   03/20/24 1257 03/22/24 0740 03/22/24 0835  Weight: 51.1 kg 41.4 kg 41.4 kg    Intake/Output: I/O last 3 completed shifts: In: 55.2 [IV Piggyback:55.2] Out: -    Intake/Output this shift:  No intake/output data recorded.  Physical Exam: General: NAD, laying in bed  Head: Normocephalic, atraumatic. Moist oral mucosal membranes  Eyes: Anicteric  Neck: Supple  Lungs:  Clear to auscultation, normal effort  Heart: Regular rate and rhythm  Abdomen:  Soft, nontender  Extremities: No peripheral edema.   Neurologic: Awake, alert  Skin: Warm,dry, no rash  Access: Left aVF    Basic Metabolic Panel: Recent Labs  Lab 03/16/24 1515 03/20/24 0900 03/22/24 0602  NA 131* 132* 132*  K 3.8 4.3 4.3  CL 90* 95* 92*  CO2 31 25 26   GLUCOSE 125* 116* 104*  BUN 23 42* 48*  CREATININE 5.57* 8.74* 7.66*  CALCIUM  8.6* 9.1 9.0  PHOS  --  3.6 4.8*    Liver Function Tests: Recent Labs  Lab 03/22/24 0602  ALBUMIN 3.0*   No results for input(s): LIPASE, AMYLASE in the last 168 hours. No results for input(s): AMMONIA in the last 168 hours.  CBC: Recent Labs  Lab 03/17/24 0457 03/20/24 1452 03/22/24 0602  WBC 5.2 17.2* 14.9*  HGB 10.8* 10.0* 10.0*  HCT 33.8* 30.9* 30.9*  MCV 92.6 92.8 94.2  PLT 188 155 173    Cardiac Enzymes: No results for input(s): CKTOTAL, CKMB, CKMBINDEX, TROPONINI in the last 168 hours.  BNP: Invalid input(s): POCBNP  CBG: Recent Labs  Lab 03/20/24 2115 03/21/24 0728 03/21/24 1540 03/21/24 2000 03/22/24 0744  GLUCAP 123* 140* 129* 121* 96    Microbiology: Results for orders placed or performed during the hospital encounter of 03/05/24  Culture, blood (Routine X 2) w Reflex to ID Panel     Status: None (Preliminary result)   Collection Time: 03/21/24  5:32 PM   Specimen: BLOOD  Result Value Ref Range Status   Specimen Description BLOOD BLOOD RIGHT HAND  Final   Special Requests   Final    BOTTLES DRAWN AEROBIC AND ANAEROBIC Blood Culture adequate volume   Culture   Final    NO GROWTH <  12 HOURS Performed at [facility], [address] [place], [place] [address]    Report Status PENDING  Incomplete  Culture, blood (Routine X 2) w Reflex to ID Panel     Status: None (Preliminary result)   Collection Time: 03/21/24  5:32 PM   Specimen: BLOOD  Result Value Ref Range Status   Specimen Description BLOOD BLOOD RIGHT ARM  Final   Special Requests   Final    BOTTLES DRAWN AEROBIC AND ANAEROBIC Blood Culture adequate  volume   Culture   Final    NO GROWTH < 12 HOURS Performed at [facility], [address] [place], [place] [address]    Report Status PENDING  Incomplete    Coagulation Studies: No results for input(s): LABPROT, INR in the last 72 hours.   Urinalysis: No results for input(s): COLORURINE, LABSPEC, PHURINE, GLUCOSEU, HGBUR, BILIRUBINUR, KETONESUR, PROTEINUR, UROBILINOGEN, NITRITE, LEUKOCYTESUR in the last 72 hours.  Invalid input(s): APPERANCEUR    Imaging: DG Abd Portable 1V Result Date: 03/21/2024 CLINICAL DATA:  Leukocytosis. EXAM: PORTABLE ABDOMEN - 1 VIEW COMPARISON:  Abdominal radiograph dated 05/11/2018. FINDINGS: Moderate stool throughout the colon. No bowel dilatation or evidence of obstruction. No free air. Right upper quadrant cholecystectomy clips. Degenerative changes of the spine. No acute osseous pathology. IMPRESSION: Moderate colonic stool burden. No bowel obstruction. Electronically Signed   By: Vanetta Chou M.D.   On: 03/21/2024 16:30   DG Chest Port 1 View Result Date: 03/21/2024 CLINICAL DATA:  Leukocytosis. EXAM: PORTABLE CHEST 1 VIEW COMPARISON:  Chest radiograph dated 03/04/2024. FINDINGS: Right-sided Port-A-Cath in similar position. There is cardiomegaly with vascular congestion. Small left pleural effusion and left lung base atelectasis or infiltrate, improved since the prior radiograph. No pneumothorax. No acute pathology. IMPRESSION: 1. Cardiomegaly with vascular congestion. 2. Small left pleural effusion and left lung base atelectasis or infiltrate, improved since the prior radiograph. Electronically Signed   By: Vanetta Chou M.D.   On: 03/21/2024 16:30      Medications:    promethazine  (PHENERGAN ) injection (IM or IVPB) 12.5 mg (03/20/24 2258)    aspirin   300 mg Rectal Daily   Or   aspirin   325 mg Oral Daily   atorvastatin   40 mg Oral Daily   carvedilol   25 mg Oral BID WC   Chlorhexidine  Gluconate Cloth   6 each Topical Daily   cloNIDine   0.3 mg Transdermal Weekly   diltiazem   180 mg Oral Daily   doxazosin   4 mg Oral Daily   feeding supplement (NEPRO CARB STEADY)  237 mL Oral TID BM   losartan   100 mg Oral Daily   polyethylene glycol  17 g Oral Daily   QUEtiapine   25 mg Oral TID   QUEtiapine   75 mg Oral Daily   sevelamer  carbonate  1,600 mg Oral TID WC   sodium chloride  flush  10-40 mL Intracatheter Q12H   spironolactone   25 mg Oral Daily   acetaminophen  **OR** acetaminophen  (TYLENOL ) oral liquid 160 mg/5 mL **OR** acetaminophen , haloperidol  lactate, hydrALAZINE , hydrALAZINE , labetalol , LORazepam , ondansetron  (ZOFRAN ) IV, promethazine  (PHENERGAN ) injection (IM or IVPB), QUEtiapine , sodium chloride  flush, sodium chloride  flush, traZODone   Assessment/ Plan:  Tracy Jennings is a 68 y.o.  female with past medical conditions including end stage renal disease on hemodialysis, dementia, hypertension, diabetes, hyperlipidemia, cervical cancer, recurrent pancreatitis, PVD, and end-stage renal disease on hemodialysis.  Patient presents to the emergency department after experiencing slurred speech, left-sided facial spasms and weakness.  Patient has  been admitted for TIA (transient ischemic attack) [G45.9] Hallucination [R44.3]  [facility] [place]/MWF/Lt AVG    End-stage renal disease on hemodialysis. - Receiving dialysis today, UF 1L as tolerated. Next treatment scheduled for Monday.   Transient ischemic attack, reporting symptoms of slurred speech, involuntary left-sided facial spasms and weakness.  Neurology consulted, TIA versus partial complex seizure.  Brain imaging negative for acute infarct.  Carotid ultrasound shows right-sided 50 to 60% stenosis without plaque.  Symptoms resolved.    3.  Hypertension: hypertensive urgency on admission.  - continue losartan , carvedilol , clonidine , diltiazem , doxazosin , and spironolactone .  blood pressure severely elevated on admission.    -  discontinued isosorbide  mononitrate due to family concerns.   - Blood pressure 147/50 during dialysis  4. Secondary Hyperparathyroidism:  - continue sevelamer  with meals  - Will continue to monitor   5. Anemia with chronic kidney disease: Mircera as outpatient. Holding ESA due to acute ischemic event.   -Hgb 10.0.  No indication for ESA at this time.      LOS: 15 Renate Danh 7/11/202510:46 AM

## 2024-03-23 DIAGNOSIS — I160 Hypertensive urgency: Secondary | ICD-10-CM | POA: Diagnosis not present

## 2024-03-23 LAB — CBC
HCT: 31.3 % — ABNORMAL LOW (ref 36.0–46.0)
Hemoglobin: 9.9 g/dL — ABNORMAL LOW (ref 12.0–15.0)
MCH: 30.1 pg (ref 26.0–34.0)
MCHC: 31.6 g/dL (ref 30.0–36.0)
MCV: 95.1 fL (ref 80.0–100.0)
Platelets: 187 K/uL (ref 150–400)
RBC: 3.29 MIL/uL — ABNORMAL LOW (ref 3.87–5.11)
RDW: 14.7 % (ref 11.5–15.5)
WBC: 9.9 K/uL (ref 4.0–10.5)
nRBC: 0 % (ref 0.0–0.2)

## 2024-03-23 LAB — GLUCOSE, CAPILLARY: Glucose-Capillary: 76 mg/dL (ref 70–99)

## 2024-03-23 MED ORDER — SENNOSIDES-DOCUSATE SODIUM 8.6-50 MG PO TABS
1.0000 | ORAL_TABLET | Freq: Two times a day (BID) | ORAL | Status: DC
  Administered 2024-03-23 – 2024-04-02 (×13): 1 via ORAL
  Filled 2024-03-23 (×18): qty 1

## 2024-03-23 MED ORDER — TRAZODONE HCL 50 MG PO TABS
50.0000 mg | ORAL_TABLET | Freq: Every day | ORAL | Status: DC
  Administered 2024-03-23 – 2024-04-01 (×7): 50 mg via ORAL
  Filled 2024-03-23 (×10): qty 1

## 2024-03-23 MED ORDER — BISACODYL 5 MG PO TBEC
5.0000 mg | DELAYED_RELEASE_TABLET | Freq: Every day | ORAL | Status: DC | PRN
  Administered 2024-03-23: 5 mg via ORAL
  Filled 2024-03-23: qty 1

## 2024-03-23 NOTE — Plan of Care (Signed)
  Problem: Ischemic Stroke/TIA Tissue Perfusion: Goal: Complications of ischemic stroke/TIA will be minimized Outcome: Progressing   Problem: Self-Care: Goal: Ability to participate in self-care as condition permits will improve Outcome: Progressing   Problem: Clinical Measurements: Goal: Will remain free from infection Outcome: Progressing   Problem: Activity: Goal: Risk for activity intolerance will decrease Outcome: Progressing

## 2024-03-23 NOTE — Progress Notes (Addendum)
 [facility]  ROUNDING NOTE   Subjective:   Tracy Jennings is a 68 year old female with past medical conditions including dementia, hypertension, diabetes, hyperlipidemia, cervical cancer, recurrent pancreatitis, PVD, and end-stage renal disease on hemodialysis.  Patient presents to the emergency department after experiencing slurred speech, left-sided facial spasms and weakness.  Patient has been admitted on 03/05/2024 for TIA (transient ischemic attack) [G45.9] Hallucination [R44.3]   Patient is known to our practice and receives outpatient dialysis treatments at [facility] on a MWF schedule, supervised by Dr. Dennise.   Update: Patient seen and evaluated at the bedside.  On room air, sister and brother at bedside.  Patient is alert and awake. Patient reports sick on stomach, endorses nausea, denies vomiting. Primary team aware.   Objective:  Vital signs in last 24 hours:  Temp:  [98.4 F (36.9 C)-99.4 F (37.4 C)] 98.4 F (36.9 C) (07/12 0815) Pulse Rate:  [65-76] 65 (07/12 0815) Resp:  [16-18] 18 (07/12 0815) BP: (141-168)/(70-81) 168/81 (07/12 0815) SpO2:  [94 %-99 %] 97 % (07/12 0815)  Weight change:  Filed Weights   03/22/24 0740 03/22/24 0835 03/22/24 1320  Weight: 41.4 kg 41.4 kg 51.7 kg    Intake/Output: I/O last 3 completed shifts: In: -  Out: 1000 [Other:1000]   Intake/Output this shift:  No intake/output data recorded.  Physical Exam: General: NAD  Head: Normocephalic, atraumatic. Moist oral mucosal membranes  Eyes: Anicteric  Neck: Supple  Lungs:  Room air, normal effort  Heart: Regular rate and rhythm  Abdomen:  Soft, nontender,   Extremities: None peripheral edema.  Neurologic: Alert and awake  Skin: No lesions  Access: Left AV fistula    Basic Metabolic Panel: Recent Labs  Lab 03/16/24 1515 03/20/24 0900 03/22/24 0602  NA 131* 132* 132*  K 3.8 4.3 4.3  CL 90* 95* 92*  CO2 31 25 26   GLUCOSE 125* 116* 104*  BUN  23 42* 48*  CREATININE 5.57* 8.74* 7.66*  CALCIUM  8.6* 9.1 9.0  PHOS  --  3.6 4.8*    Liver Function Tests: Recent Labs  Lab 03/22/24 0602  ALBUMIN 3.0*   No results for input(s): LIPASE, AMYLASE in the last 168 hours. No results for input(s): AMMONIA in the last 168 hours.  CBC: Recent Labs  Lab 03/17/24 0457 03/20/24 1452 03/22/24 0602 03/23/24 0943  WBC 5.2 17.2* 14.9* 9.9  HGB 10.8* 10.0* 10.0* 9.9*  HCT 33.8* 30.9* 30.9* 31.3*  MCV 92.6 92.8 94.2 95.1  PLT 188 155 173 187    Cardiac Enzymes: No results for input(s): CKTOTAL, CKMB, CKMBINDEX, TROPONINI in the last 168 hours.  BNP: Invalid input(s): POCBNP  CBG: Recent Labs  Lab 03/21/24 2000 03/22/24 0744 03/22/24 1643 03/22/24 1929 03/23/24 0732  GLUCAP 121* 96 97 83 76    Microbiology: Results for orders placed or performed during the hospital encounter of 03/05/24  Culture, blood (Routine X 2) w Reflex to ID Panel     Status: None (Preliminary result)   Collection Time: 03/21/24  5:32 PM   Specimen: BLOOD  Result Value Ref Range Status   Specimen Description BLOOD BLOOD RIGHT HAND  Final   Special Requests   Final    BOTTLES DRAWN AEROBIC AND ANAEROBIC Blood Culture adequate volume   Culture   Final    NO GROWTH 2 DAYS Performed at [facility], [address], [place], [place] [address]    Report Status PENDING  Incomplete  Culture, blood (Routine X  2) w Reflex to ID Panel     Status: None (Preliminary result)   Collection Time: 03/21/24  5:32 PM   Specimen: BLOOD  Result Value Ref Range Status   Specimen Description BLOOD BLOOD RIGHT ARM  Final   Special Requests   Final    BOTTLES DRAWN AEROBIC AND ANAEROBIC Blood Culture adequate volume   Culture   Final    NO GROWTH 2 DAYS Performed at [facility], [address] [place], [place] [address]    Report Status PENDING  Incomplete    Coagulation Studies: No results for input(s): LABPROT, INR in  the last 72 hours.  Urinalysis: No results for input(s): COLORURINE, LABSPEC, PHURINE, GLUCOSEU, HGBUR, BILIRUBINUR, KETONESUR, PROTEINUR, UROBILINOGEN, NITRITE, LEUKOCYTESUR in the last 72 hours.  Invalid input(s): APPERANCEUR    Imaging: No results found.   Medications:    promethazine  (PHENERGAN ) injection (IM or IVPB) 12.5 mg (03/20/24 2258)    aspirin   300 mg Rectal Daily   Or   aspirin   325 mg Oral Daily   atorvastatin   40 mg Oral Daily   carvedilol   25 mg Oral BID WC   Chlorhexidine  Gluconate Cloth  6 each Topical Daily   cloNIDine   0.3 mg Transdermal Weekly   diltiazem   180 mg Oral Daily   feeding supplement (NEPRO CARB STEADY)  237 mL Oral TID BM   losartan   100 mg Oral Daily   polyethylene glycol  17 g Oral Daily   QUEtiapine   25 mg Oral TID   QUEtiapine   75 mg Oral Daily   senna-docusate  1 tablet Oral BID   sodium chloride  flush  10-40 mL Intracatheter Q12H   spironolactone   25 mg Oral Daily   traZODone   50 mg Oral QHS   acetaminophen  **OR** acetaminophen  (TYLENOL ) oral liquid 160 mg/5 mL **OR** acetaminophen , bisacodyl , calcium  carbonate, haloperidol  lactate, hydrALAZINE , hydrALAZINE , labetalol , LORazepam , ondansetron  (ZOFRAN ) IV, promethazine  (PHENERGAN ) injection (IM or IVPB), QUEtiapine , sodium chloride  flush, sodium chloride  flush  Assessment/ Plan:  [name] is a 68 y.o.  female with past medical conditions including end-stage renal disease on hemodialysis, dementia, hypertension, diabetes, hyperlipidemia, cervical cancer, recurrent pancreatitis, PVD and end-stage renal disease on hemodialysis.  Patient presents to the emergency department after experiencing slurred speech, left-sided facial spasms and weakness.  Patient has been admitted for TIA (transient ischemic attack) [G45.9] Hallucination [R44.3]   [facility] /MWF/Lt AVG      End-stage renal disease on hemodialysis. - Received dialysis Friday, UF 1L.  Next treatment scheduled for Monday.    Transient ischemic attack, reporting symptoms of slurred speech, involuntary left-sided facial spasms and weakness.  Neurology consulted, TIA versus partial complex seizure.  Brain imaging negative for acute infarct.  Carotid ultrasound shows right-sided 50 to 60% stenosis without plaque.  Symptoms resolved.     3.  Hypertension: hypertensive urgency on admission.  - continue losartan , carvedilol , clonidine , diltiazem , doxazosin , and spironolactone .  blood pressure severely elevated on admission.    - discontinued isosorbide  mononitrate due to family concerns.              - Blood pressure today 168/81   4. Secondary Hyperparathyroidism:             - continue sevelamer  with meals, Phos 4.8 03/22/2024             - Will continue to monitor    5. Anemia with chronic kidney disease: Mircera as outpatient. Holding ESA due to acute  ischemic event.              -Hgb 9.9.  No indication for ESA at this time.     LOS: 16 Carie Kapuscinski P Levorn 7/12/20252:53 PM

## 2024-03-23 NOTE — Progress Notes (Signed)
 Progress Note   Patient: Tracy Jennings FMW:[id number] DOB: 09-09-1956 DOA: 03/05/2024     16 DOS: the patient was seen and examined on 03/23/2024   Brief hospital course:  Tracy Jennings is a 68 year old female with ESRD on HD MWF, HTN, HLD, DM, PVD, dementia, chronic recurrent pancreatitis, stroke, cervical cancer, HIT, who presents with slurred speech, left facial spasm, left-sided weakness.  Patient had syncopal episode on 6/23 and was seen in the ED.  At that time she was found to have O2 desaturation to 80% due to pulmonary edema on chest x-ray as well as hyperkalemia with a potassium of 6.2.  She had negative head CT during that admission.  She received hemodialysis and then was discharged home.  Her last known normal was 6/24 at 1700.  Upon arrival to the ED patient's confusion had resolved entirely.  Labs in the ED mostly unremarkable, potassium 4.6, creatinine consistent with ESRD.  Blood pressure was found to be 252/79.  Patient was placed in telemetry bed for observation.  Code stroke was called.Blood pressure resolved.  Head CT was without acute intracranial abnormality.  Chronic infarct seen on right parietal lobe and extensive chronic microvascular ischemic changes with remote lacunar infarcts in the right caudate and right thalamus appreciated.  Patient was admitted for workup of presumed TIA.  LDL 96, hemoglobin A1c 5.1%.  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  MRI brain revealed chronic encephalomalacia within the right parietal lobe and advanced diffuse cerebral white matter disease.  Patient underwent carotid Dopplers which revealed elevated velocity within the distal right internal carotid artery suggesting stenosis of 50 to 69% though no significant plaque identified and thus this may be artifactual. Patient underwent hemodialysis on 6/25 but left her session early AMA.she attempted to leave the hospital AMA but during this event began hallucinating  and loss capacity.  Psychiatry was consulted and met with the patient on 6/26 and confirms acute delirium, currently the patient does not have capacity to make medical decisions.  She has been started on Seroquel  for presumed Lewy body dementia.  She has had persistent agitation and Seroquel  has been slowly increased. She is currently medically stable and undergoing routine HD while awaiting safe dispo. TOC is consulted.    BP is labile and high.  Family voiced concern about isosorbide  mononitrate.  Nephrology discontinued isosorbide  and possibly add some other medications.  Patient is currently on losartan , Coreg , clonidine , diltiazem , doxazosin  and the spironolactone .   7/6.  Case discussed with nephrology and switching clonidine  pills over to clonidine  patch.  Imdur  discontinued the day prior.  Family interested in taking patient home rather than going to rehab 7/7.  Patient seen on dialysis and blood pressure on the lower side.  Continue to monitor closely.  Patient complains of pains and was given Tylenol . 7/8.  Patient did not sleep last night.  She was able to answer questions and follow commands for me this morning.  Was sleeping during the day.   I assumed care on 03/20/24.   7/9 - Pt had dialysis. Otherwise uneventful day. 7/10 - new onset nausea/vomiting, leukocytosis, no tolerating PO meds, BP's uncontrolled 7/11 - BP's still significantly elevated. Pt vomited her PO meds yesterday AM.  7/12 - BP's improved with systolic's in 140's to 160's.  No N/V reported.  +Visual hallucinations, but no agitation issues reported since I assumed care.   Assessment and Plan:  Hypertensive urgency BP's remain overall elevated, at times still  up at 190's-200's systolic.  Improved with increased dose of Cardizem  7/11.  BP's labile in dialysis. --Continue clonidine  patch, Coreg , Cardizem  CD, losartan , spironolactone  --Increased Cardizem  from 120 >> 180 mg --Nephrology dc'd doxazosin  -- apparently pt  has had issues in the past with this medication in outpatient setting --Titrate regimen as needed  Leukocytosis - Resolved. WBC elevated to 17k on 7/10, suspect reactive in setting of nausea/vomiting.  CXR no pneumonia, abdominal x-ray moderate stool burden. 7/12 - WBC normalized to 9.9.  No fevers. --Blood cultures negative to date - follow to final --Monitor CBC  --Monitor clinically for s/sx's of infection  Nausea and Vomiting - reported overnight 7/9-10 and morning of 7/10 pt vomited after PO meds. She is having BM's, unlikely obstruction.  RN documented emesis was undigested food, ?gastroparesis. --PRN IV antiemetics --Abdominal x-ray 7/10 showed moderate stool burden throughout colon, no signs of obstruction  Acute delirium With hallucinations.  Issues with wake/sleep schedule since admission, started on Seroquel  with improvement.  Patient may do better in her home environment rather than in the hospital.  Per Dr. Josette, brother planned to speak with other family members about potential for taking patient home with 24/7 care and home health services. --Continued Seroquel  25 mg TID + 75 mg at bedtime --PRN Seroquel  also available for uncontrolled agitation/anxiety.  Dementia with behavioral disturbance (HCC) Patient on Seroquel  and as needed Haldol .   Patient was seen by psychiatry does not have the capacity to leave AMA.   Family discussing plans to take her home rather than SNF.  ESRD (end stage renal disease) [facility]) Nephrology following.  On dialysis Monday Wednesday Friday  Type II diabetes mellitus with renal manifestations (HCC) Last hemoglobin A1c 5.1  Anemia in ESRD (end-stage renal disease) (HCC) Hbg stable.  Hyperlipidemia Continue Lipitor  TIA (transient ischemic attack) Ruled out.  MRI of the brain shows chronic encephalomalacia within the right parietal lobe and advanced diffuse cerebral white matter disease, carotid Doppler showing elevated velocity in the  distal right internal carotid suggesting stenosis of 50 to 69%, EEG showing cortical dysfunction arising from the right temporal parietal region secondary to underlying structural abnormality no seizure or left to form discharges, EF normal range.  Hyponatremia Dialysis for volume management      Subjective: Pt seen with brother at bedside this AM.  Pt having visual hallucinations of three boys in the room and wants them to go away.  She states they've been poking at her.  Otherwise no reports of agitation or any behavioral issues.  No nausea/vomiting, tolerated some breakfast.  Brother reports pt will need rehab,that family will be unable to provide 24/7 in home support and pt now too weak to return home.   Physical Exam: Vitals:   03/22/24 1321 03/22/24 1640 03/22/24 1932 03/23/24 0815  BP:  (!) 141/70 (!) 160/79 (!) 168/81  Pulse: 76 76 72 65  Resp: 18 16 18 18   Temp:  99.4 F (37.4 C) 98.4 F (36.9 C) 98.4 F (36.9 C)  TempSrc:   Oral Oral  SpO2: 99% 94% 99% 97%  Weight:      Height:       General exam: awake & alert, sitting up in bed HEENT: hearing grossly normal, moist mucus membranes Respiratory system: CTAB, no wheezes or rhonchi, on room air, normal respiratory effort. Cardiovascular system: RRR Gastrointestinal system: soft, NT, ND Central nervous system: A&O x 2. no gross focal neurologic deficits, minimal but normal speech Skin: dry, intact, normal temperature  Psychiatry: normal mood, congruent affect, having visual hallucinations   Data Reviewed:  Notable labs --   CBC with WBC 14.9 >> 9.9, stable Hbg 9.9  CBG's 96 >> 97 >> 83 >> 76   Family Communication: Patient's brother at bedside on rounds this AM, updated in detail and questions answered.  He states family will be unable to provide 24/7 care at home, want to proceed with sending patient to rehab at time of discharge.   Disposition: Status is: Inpatient Remains inpatient appropriate because:  awaiting SNF placement.  Medically stable.    Planned Discharge Destination: SNF      Time spent: 38 minutes  Author: Burnard Tracy Cunning, DO 03/23/2024 1:33 PM  For on call review [email or web address].

## 2024-03-24 DIAGNOSIS — I160 Hypertensive urgency: Secondary | ICD-10-CM | POA: Diagnosis not present

## 2024-03-24 LAB — GLUCOSE, CAPILLARY
Glucose-Capillary: 68 mg/dL — ABNORMAL LOW (ref 70–99)
Glucose-Capillary: 139 mg/dL — ABNORMAL HIGH (ref 70–99)
Glucose-Capillary: 106 mg/dL — ABNORMAL HIGH (ref 70–99)
Glucose-Capillary: 102 mg/dL — ABNORMAL HIGH (ref 70–99)

## 2024-03-24 NOTE — Progress Notes (Signed)
 Pt cbg 68 at 0734-RN gave 4oz orange juice. See labs/chart review. Will continue to monitor. Parv Manthey S Gianni Mihalik

## 2024-03-24 NOTE — Progress Notes (Signed)
 [facility]  ROUNDING NOTE   Subjective:  Patient is known to our practice and receives outpatient dialysis treatments at [facility] on a MWF schedule, supervised by Dr. Dennise.  Update today: Patient seen and evaluated at bedside, sister in room. Doxazosin  discontinued the day before and improved mentation today. Memory improved as patient recognized us  by name from yesterday. Patient reports good sleep, feeling better, denies nausea, denies vomiting.   Objective:  Vital signs in last 24 hours:  Temp:  [98.1 F (36.7 C)-98.4 F (36.9 C)] 98.1 F (36.7 C) (07/13 0739) Pulse Rate:  [68-76] 76 (07/13 0739) Resp:  [18] 18 (07/13 0457) BP: (122-187)/(43-80) 166/80 (07/13 0739) SpO2:  [93 %-100 %] 99 % (07/13 0739)  Weight change:  Filed Weights   03/22/24 0740 03/22/24 0835 03/22/24 1320  Weight: 41.4 kg 41.4 kg 51.7 kg    Intake/Output: No intake/output data recorded.   Intake/Output this shift:  Total I/O In: 120 [P.O.:120] Out: -   Physical Exam: General: NAD, pleasant  Head: Normocephalic  Eyes: Anicteric  Neck: Supple  Lungs:  Normal effort  Heart: Regular rate and rhythm  Abdomen:  Soft, nontender,   Extremities:  No peripheral edema.  Neurologic: Alert and awake  Skin: warm  Access: Left AVF    Basic Metabolic Panel: Recent Labs  Lab 03/20/24 0900 03/22/24 0602  NA 132* 132*  K 4.3 4.3  CL 95* 92*  CO2 25 26  GLUCOSE 116* 104*  BUN 42* 48*  CREATININE 8.74* 7.66*  CALCIUM  9.1 9.0  PHOS 3.6 4.8*    Liver Function Tests: Recent Labs  Lab 03/22/24 0602  ALBUMIN 3.0*   No results for input(s): LIPASE, AMYLASE in the last 168 hours. No results for input(s): AMMONIA in the last 168 hours.  CBC: Recent Labs  Lab 03/20/24 1452 03/22/24 0602 03/23/24 0943  WBC 17.2* 14.9* 9.9  HGB 10.0* 10.0* 9.9*  HCT 30.9* 30.9* 31.3*  MCV 92.8 94.2 95.1  PLT 155 173 187    Cardiac Enzymes: No results for input(s):  CKTOTAL, CKMB, CKMBINDEX, TROPONINI in the last 168 hours.  BNP: Invalid input(s): POCBNP  CBG: Recent Labs  Lab 03/22/24 1643 03/22/24 1929 03/23/24 0732 03/24/24 0733 03/24/24 0902  GLUCAP 97 83 76 68* 139*    Microbiology: Results for orders placed or performed during the hospital encounter of 03/05/24  Culture, blood (Routine X 2) w Reflex to ID Panel     Status: None (Preliminary result)   Collection Time: 03/21/24  5:32 PM   Specimen: BLOOD  Result Value Ref Range Status   Specimen Description BLOOD BLOOD RIGHT HAND  Final   Special Requests   Final    BOTTLES DRAWN AEROBIC AND ANAEROBIC Blood Culture adequate volume   Culture   Final    NO GROWTH 3 DAYS Performed at [facility], [address] [place], [place] [address]    Report Status PENDING  Incomplete  Culture, blood (Routine X 2) w Reflex to ID Panel     Status: None (Preliminary result)   Collection Time: 03/21/24  5:32 PM   Specimen: BLOOD  Result Value Ref Range Status   Specimen Description BLOOD BLOOD RIGHT ARM  Final   Special Requests   Final    BOTTLES DRAWN AEROBIC AND ANAEROBIC Blood Culture adequate volume   Culture   Final    NO GROWTH 3 DAYS Performed at [facility], [address] [place], [place] [address]  Report Status PENDING  Incomplete    Coagulation Studies: No results for input(s): LABPROT, INR in the last 72 hours.  Urinalysis: No results for input(s): COLORURINE, LABSPEC, PHURINE, GLUCOSEU, HGBUR, BILIRUBINUR, KETONESUR, PROTEINUR, UROBILINOGEN, NITRITE, LEUKOCYTESUR in the last 72 hours.  Invalid input(s): APPERANCEUR    Imaging: No results found.   Medications:    promethazine  (PHENERGAN ) injection (IM or IVPB) 12.5 mg (03/20/24 2258)    aspirin   300 mg Rectal Daily   Or   aspirin   325 mg Oral Daily   atorvastatin   40 mg Oral Daily   carvedilol   25 mg Oral BID WC   Chlorhexidine  Gluconate Cloth  6  each Topical Daily   cloNIDine   0.3 mg Transdermal Weekly   diltiazem   180 mg Oral Daily   feeding supplement (NEPRO CARB STEADY)  237 mL Oral TID BM   losartan   100 mg Oral Daily   polyethylene glycol  17 g Oral Daily   QUEtiapine   25 mg Oral TID   QUEtiapine   75 mg Oral Daily   senna-docusate  1 tablet Oral BID   sodium chloride  flush  10-40 mL Intracatheter Q12H   spironolactone   25 mg Oral Daily   traZODone   50 mg Oral QHS   acetaminophen  **OR** acetaminophen  (TYLENOL ) oral liquid 160 mg/5 mL **OR** acetaminophen , bisacodyl , calcium  carbonate, haloperidol  lactate, hydrALAZINE , hydrALAZINE , labetalol , LORazepam , ondansetron  (ZOFRAN ) IV, promethazine  (PHENERGAN ) injection (IM or IVPB), QUEtiapine , sodium chloride  flush, sodium chloride  flush  Assessment/ Plan:  Ms. SUZZANE QUILTER is a 68 y.o.  female  with past medical conditions including end-stage renal disease on hemodialysis, dementia, hypertension, diabetes, hyperlipidemia, cervical cancer, recurrent pancreatitis, PVD and end-stage renal disease on hemodialysis.  Patient presents to the emergency department after experiencing slurred speech, left-sided facial spasms and weakness.  Patient has been admitted for TIA (transient ischemic attack) [G45.9] Hallucination [R44.3]   [facility] [place]/MWF/Lt AVG      End-stage renal disease on hemodialysis. Next treatment scheduled for Monday if patient still here.   Transient ischemic attack, reporting symptoms of slurred speech, involuntary left-sided facial spasms and weakness.  Neurology consulted, TIA versus partial complex seizure.  Brain imaging negative for acute infarct.  Carotid ultrasound shows right-sided 50 to 60% stenosis without plaque.  Symptoms resolved.     3.  Hypertension: hypertensive urgency on admission.  - Patient prescribed carvedilol , clonidine , diltiazem , losartan , and spironolactone . Hydralazine , labetalol  for PRN use             - Today BP is166/80   4.  Secondary Hyperparathyroidism:             - continue sevelamer  with meals             - Monitoring bone mineral this admission   5. Anemia with chronic kidney disease: Mircera as outpatient. Holding ESA due to acute ischemic event.              -Hgb 9.9.  Will continue to monitor need for ESA.   LOS: 17 Jacqueleen Pulver P Rockelle Heuerman 7/13/202511:28 AM

## 2024-03-24 NOTE — Plan of Care (Signed)
  Problem: Ischemic Stroke/TIA Tissue Perfusion: Goal: Complications of ischemic stroke/TIA will be minimized Outcome: Progressing   Problem: Coping: Goal: Will verbalize positive feelings about self Outcome: Progressing Goal: Will identify appropriate support needs Outcome: Progressing   Problem: Health Behavior/Discharge Planning: Goal: Goals will be collaboratively established with patient/family Outcome: Progressing   Problem: Self-Care: Goal: Ability to participate in self-care as condition permits will improve Outcome: Progressing Goal: Verbalization of feelings and concerns over difficulty with self-care will improve Outcome: Progressing Goal: Ability to communicate needs accurately will improve Outcome: Progressing   Problem: Nutrition: Goal: Dietary intake will improve Outcome: Progressing   Problem: Clinical Measurements: Goal: Will remain free from infection Outcome: Progressing Goal: Respiratory complications will improve Outcome: Progressing   Problem: Education: Goal: Knowledge of disease or condition will improve Outcome: Not Progressing Goal: Knowledge of secondary prevention will improve (MUST DOCUMENT ALL) Outcome: Not Progressing Goal: Knowledge of patient specific risk factors will improve (DELETE if not current risk factor) Outcome: Not Progressing   Problem: Health Behavior/Discharge Planning: Goal: Ability to manage health-related needs will improve Outcome: Not Progressing

## 2024-03-24 NOTE — Plan of Care (Signed)
  Problem: Education: Goal: Knowledge of secondary prevention will improve (MUST DOCUMENT ALL) Outcome: Progressing   Problem: Ischemic Stroke/TIA Tissue Perfusion: Goal: Complications of ischemic stroke/TIA will be minimized 03/24/2024 0549 by Roark Selinda CROME, RN Outcome: Progressing 03/24/2024 0547 by Roark Selinda CROME, RN Outcome: Progressing   Problem: Health Behavior/Discharge Planning: Goal: Ability to manage health-related needs will improve Outcome: Progressing   Problem: Self-Care: Goal: Ability to participate in self-care as condition permits will improve Outcome: Progressing Goal: Ability to communicate needs accurately will improve Outcome: Progressing   Problem: Clinical Measurements: Goal: Ability to maintain clinical measurements within normal limits will improve Outcome: Progressing Goal: Will remain free from infection Outcome: Progressing

## 2024-03-24 NOTE — Plan of Care (Signed)
  Problem: Ischemic Stroke/TIA Tissue Perfusion: Goal: Complications of ischemic stroke/TIA will be minimized Outcome: Progressing   Problem: Self-Care: Goal: Ability to participate in self-care as condition permits will improve Outcome: Progressing   Problem: Clinical Measurements: Goal: Ability to maintain clinical measurements within normal limits will improve Outcome: Progressing Goal: Will remain free from infection Outcome: Progressing

## 2024-03-24 NOTE — Progress Notes (Signed)
 Progress Note   Patient: Tracy Jennings FMW:[id number] DOB: 1955/12/06 DOA: 03/05/2024     17 DOS: the patient was seen and examined on 03/24/2024   Brief hospital course:  Tracy Jennings is a 68 year old female with ESRD on HD MWF, HTN, HLD, DM, PVD, dementia, chronic recurrent pancreatitis, stroke, cervical cancer, HIT, who presents with slurred speech, left facial spasm, left-sided weakness.  Patient had syncopal episode on 6/23 and was seen in the ED.  At that time she was found to have O2 desaturation to 80% due to pulmonary edema on chest x-ray as well as hyperkalemia with a potassium of 6.2.  She had negative head CT during that admission.  She received hemodialysis and then was discharged home.  Her last known normal was 6/24 at 1700.  Upon arrival to the ED patient's confusion had resolved entirely.  Labs in the ED mostly unremarkable, potassium 4.6, creatinine consistent with ESRD.  Blood pressure was found to be 252/79.  Patient was placed in telemetry bed for observation.  Code stroke was called.Blood pressure resolved.  Head CT was without acute intracranial abnormality.  Chronic infarct seen on right parietal lobe and extensive chronic microvascular ischemic changes with remote lacunar infarcts in the right caudate and right thalamus appreciated.  Patient was admitted for workup of presumed TIA.  LDL 96, hemoglobin A1c 5.1%.  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  MRI brain revealed chronic encephalomalacia within the right parietal lobe and advanced diffuse cerebral white matter disease.  Patient underwent carotid Dopplers which revealed elevated velocity within the distal right internal carotid artery suggesting stenosis of 50 to 69% though no significant plaque identified and thus this may be artifactual. Patient underwent hemodialysis on 6/25 but left her session early AMA.she attempted to leave the hospital AMA but during this event began hallucinating  and loss capacity.  Psychiatry was consulted and met with the patient on 6/26 and confirms acute delirium, currently the patient does not have capacity to make medical decisions.  She has been started on Seroquel  for presumed Lewy body dementia.  She has had persistent agitation and Seroquel  has been slowly increased. She is currently medically stable and undergoing routine HD while awaiting safe dispo. TOC is consulted.    BP is labile and high.  Family voiced concern about isosorbide  mononitrate.  Nephrology discontinued isosorbide  and possibly add some other medications.  Patient is currently on losartan , Coreg , clonidine , diltiazem , doxazosin  and the spironolactone .   7/6.  Case discussed with nephrology and switching clonidine  pills over to clonidine  patch.  Imdur  discontinued the day prior.  Family interested in taking patient home rather than going to rehab 7/7.  Patient seen on dialysis and blood pressure on the lower side.  Continue to monitor closely.  Patient complains of pains and was given Tylenol . 7/8.  Patient did not sleep last night.  She was able to answer questions and follow commands for me this morning.  Was sleeping during the day.   I assumed care on 03/20/24.   7/9 - Pt had dialysis. Otherwise uneventful day. 7/10 - new onset nausea/vomiting, leukocytosis, no tolerating PO meds, BP's uncontrolled 7/11 - BP's still significantly elevated. Pt vomited her PO meds yesterday AM.  7/12 - BP's improved with systolic's in 140's to 160's.  No N/V reported.  +Visual hallucinations, but no agitation issues reported since I assumed care. 7/13 -- BP's improved.  No N/V or complaints of stomach issues.  Assessment and Plan:  Hypertensive urgency BP's remain overall elevated, at times still up at 190's-200's systolic.  Improved with increased dose of Cardizem  7/11.  BP's labile in dialysis. --Continue clonidine  patch, Coreg , Cardizem  CD, losartan , spironolactone  --Increased Cardizem   from 120 >> 180 mg --Nephrology dc'd doxazosin  -- apparently pt has had issues in the past with this medication in outpatient setting --Titrate regimen as needed  Leukocytosis - Resolved. WBC elevated to 17k on 7/10, suspect reactive in setting of nausea/vomiting.  CXR no pneumonia, abdominal x-ray moderate stool burden. 7/12 - WBC normalized to 9.9.  No fevers. --Blood cultures negative to date - follow to final --Monitor CBC  --Monitor clinically for s/sx's of infection  Nausea and Vomiting - reported overnight 7/9-10 and morning of 7/10 pt vomited after PO meds. She is having BM's, unlikely obstruction.  RN documented emesis was undigested food, ?gastroparesis. --PRN IV antiemetics --Abdominal x-ray 7/10 showed moderate stool burden throughout colon, no signs of obstruction  Acute delirium With hallucinations.  Issues with wake/sleep schedule since admission, started on Seroquel  with improvement.  Patient may do better in her home environment rather than in the hospital.  Per Dr. Josette, brother planned to speak with other family members about potential for taking patient home with 24/7 care and home health services. --Continued Seroquel  25 mg TID + 75 mg at bedtime --PRN Seroquel  also available for uncontrolled agitation/anxiety.  Dementia with behavioral disturbance (HCC) Patient on Seroquel  and as needed Haldol .   Patient was seen by psychiatry does not have the capacity to leave AMA.   Family discussing plans to take her home rather than SNF.  ESRD (end stage renal disease) [facility]) Nephrology following.  On dialysis Monday Wednesday Friday  Type II diabetes mellitus with renal manifestations (HCC) Last hemoglobin A1c 5.1  Anemia in ESRD (end-stage renal disease) (HCC) Hbg stable.  Hyperlipidemia Continue Lipitor  TIA (transient ischemic attack) Ruled out.  MRI of the brain shows chronic encephalomalacia within the right parietal lobe and advanced diffuse cerebral white  matter disease, carotid Doppler showing elevated velocity in the distal right internal carotid suggesting stenosis of 50 to 69%, EEG showing cortical dysfunction arising from the right temporal parietal region secondary to underlying structural abnormality no seizure or left to form discharges, EF normal range.  Hyponatremia Dialysis for volume management      Subjective: Pt seen with two sisters at bedside this AM.  Pt reports feeling well and states she wants to go home.  Both sisters state the plan is for patient to return home and they are making arrangements for additional help on the days they cannot be there with her.  They feel she would not do well in a rehab facility.  Discussed readiness for discharge as soon as tomorrow after dialysis, given stability of medical issues at this time.   Physical Exam: Vitals:   03/23/24 1705 03/23/24 2000 03/24/24 0457 03/24/24 0739  BP: (!) 187/62 (!) 122/51 (!) 131/43 (!) 166/80  Pulse: 69 68 71 76  Resp: 18 18 18    Temp: 98.2 F (36.8 C) 98.4 F (36.9 C) 98.2 F (36.8 C) 98.1 F (36.7 C)  TempSrc:      SpO2: 100% 93% 100% 99%  Weight:      Height:       General exam: awake & alert, sitting up in bed, more talkative HEENT: hearing grossly normal, moist mucus membranes Respiratory system: CTAB, no wheezes or rhonchi, on room air, normal respiratory effort. Cardiovascular system:  RRR Gastrointestinal system: soft, NT, ND Central nervous system: A&O x 2. no gross focal neurologic deficits, minimal but normal speech Skin: dry, intact, normal temperature Psychiatry: normal mood, congruent affect, having visual hallucinations   Data Reviewed:  Notable labs --   CBC with WBC 14.9 >> 9.9, stable Hbg 9.9  CBG's 68 >> 139   Family Communication: Two sisters at bedside on rounds today -- they state plan is still for pt to return home & they are arranging in home support for days they cannot be with her.  TOC  updated.   Disposition: Status is: Inpatient Remains inpatient appropriate because: re-assessment with PT/OT needed to ensure  Dispo home with family support is feasible.  Otherwise will need SNF placement.  Medically stable.  If going home, plan to d/c Monday after dialysis vs Tuesday AM    Planned Discharge Destination: SNF      Time spent: 38 minutes  Author: Burnard DELENA Cunning, DO 03/24/2024 11:53 AM  For on call review [email or web address].

## 2024-03-24 NOTE — TOC Progression Note (Addendum)
 Transition of Care [facility]) - Progression Note    Patient Details  Name: Tracy Jennings MRN: [id number] Date of Birth: 1956/03/15  [facility]) CM/SW Contact  Marinda Cooks, RN Phone Number: 03/24/2024, 9:25 AM  Clinical Narrative:    This CM attempted to reach pt's (Son) Allyn Louder @  [phone] , pt's (Sister) [name] [phone] and (Brother) [name] @ [phone] to discuss dc planning  and was  sent to [facility]. Pt has a bed acceptance at [facility].   This CM spoke with pt's niece April Mitchell @ [phone] introduced my role and informed I was attempting to reach pt' son was informed she would inform get the message to them I provided my contact info TOC will cont to follow and update as applicable .    Expected Discharge Plan: Skilled Nursing Facility Barriers to Discharge: No SNF bed  Expected Discharge Plan and Services   Discharge Planning Services: CM Consult   Living arrangements for the past 2 months: Single Family Home                       Social Determinants of Health (SDOH) Interventions SDOH Screenings   Food Insecurity: Food Insecurity Present (03/06/2024)  Housing: Low Risk  (03/06/2024)  Transportation Needs: No Transportation Needs (03/06/2024)  Utilities: Not At Risk (03/06/2024)  Financial Resource Strain: Low Risk  (01/09/2024)   Received from [facility]  Social Connections: Unknown (03/06/2024)  Tobacco Use: Low Risk  (03/06/2024)    Readmission Risk Interventions     No data to display

## 2024-03-25 DIAGNOSIS — I160 Hypertensive urgency: Secondary | ICD-10-CM | POA: Diagnosis not present

## 2024-03-25 LAB — RENAL FUNCTION PANEL
Albumin: 3.4 g/dL — ABNORMAL LOW (ref 3.5–5.0)
Anion gap: 16 — ABNORMAL HIGH (ref 5–15)
BUN: 72 mg/dL — ABNORMAL HIGH (ref 8–23)
CO2: 25 mmol/L (ref 22–32)
Calcium: 9.1 mg/dL (ref 8.9–10.3)
Chloride: 92 mmol/L — ABNORMAL LOW (ref 98–111)
Creatinine, Ser: 8.94 mg/dL — ABNORMAL HIGH (ref 0.44–1.00)
GFR, Estimated: 4 mL/min — ABNORMAL LOW (ref >60)
Glucose, Bld: 108 mg/dL — ABNORMAL HIGH (ref 70–99)
Phosphorus: 4.9 mg/dL — ABNORMAL HIGH (ref 2.5–4.6)
Potassium: 4.1 mmol/L (ref 3.5–5.1)
Sodium: 133 mmol/L — ABNORMAL LOW (ref 135–145)

## 2024-03-25 LAB — HEPATIC FUNCTION PANEL
ALT: 9 U/L (ref 0–44)
AST: 13 U/L — ABNORMAL LOW (ref 15–41)
Albumin: 3.2 g/dL — ABNORMAL LOW (ref 3.5–5.0)
Alkaline Phosphatase: 36 U/L — ABNORMAL LOW (ref 38–126)
Bilirubin, Direct: <0.1 mg/dL (ref 0.0–0.2)
Total Bilirubin: 0.5 mg/dL (ref 0.0–1.2)
Total Protein: 6.8 g/dL (ref 6.5–8.1)

## 2024-03-25 LAB — HEPATITIS B SURFACE ANTIGEN: Hepatitis B Surface Ag: NONREACTIVE

## 2024-03-25 LAB — GLUCOSE, CAPILLARY: Glucose-Capillary: 107 mg/dL — ABNORMAL HIGH (ref 70–99)

## 2024-03-25 LAB — LIPASE, BLOOD: Lipase: 30 U/L (ref 11–51)

## 2024-03-25 MED ORDER — PANTOPRAZOLE SODIUM 40 MG IV SOLR
40.0000 mg | INTRAVENOUS | Status: DC
  Administered 2024-03-25 – 2024-03-26 (×2): 40 mg via INTRAVENOUS
  Filled 2024-03-25 (×2): qty 10

## 2024-03-25 MED ORDER — LIDOCAINE HCL (PF) 1 % IJ SOLN
5.0000 mL | INTRAMUSCULAR | Status: DC | PRN
Start: 2024-03-25 — End: 2024-03-25
  Filled 2024-03-25: qty 5

## 2024-03-25 MED ORDER — SIMETHICONE 40 MG/0.6ML PO SUSP
40.0000 mg | Freq: Once | ORAL | Status: DC
  Filled 2024-03-25: qty 0.6

## 2024-03-25 MED ORDER — LIDOCAINE-PRILOCAINE 2.5-2.5 % EX CREA
1.0000 | TOPICAL_CREAM | CUTANEOUS | Status: DC | PRN
  Filled 2024-03-25: qty 5

## 2024-03-25 MED ORDER — HYDROMORPHONE HCL 1 MG/ML IJ SOLN
0.5000 mg | Freq: Once | INTRAMUSCULAR | Status: AC
  Administered 2024-03-25: 0.5 mg via INTRAVENOUS
  Filled 2024-03-25: qty 0.5

## 2024-03-25 MED ORDER — SIMETHICONE 40 MG/0.6ML PO SUSP
40.0000 mg | Freq: Once | ORAL | Status: AC
  Administered 2024-03-25: 40 mg via ORAL
  Filled 2024-03-25: qty 30

## 2024-03-25 MED ORDER — PENTAFLUOROPROP-TETRAFLUOROETH EX AERO
1.0000 | INHALATION_SPRAY | CUTANEOUS | Status: DC | PRN
Start: 2024-03-25 — End: 2024-03-25
  Filled 2024-03-25: qty 30

## 2024-03-25 NOTE — Progress Notes (Signed)
 Pt currently refusing dialysis today and says I dont feel like it today. Vital signs WNL. Notified NP-Breeze. Pt awaiting transfer back to room.

## 2024-03-25 NOTE — Progress Notes (Signed)
 PT Cancellation Note  Patient Details Name: Tracy Jennings MRN: [id number] DOB: 1955/11/11   Cancelled Treatment:    Reason Eval/Treat Not Completed: Pain limiting ability to participate.  PT/OT co-treatment attempted.  Pt refusing to perform OOB mobility despite PT/OT encouragement.  Pt reporting feeling generally unwell and requesting pain medication (d/t abdominal pain and pain all over)--nurse notified/updated.  Damien Caulk, PT 03/25/24, 2:53 PM

## 2024-03-25 NOTE — Progress Notes (Signed)
 Progress Note   Patient: Tracy Jennings DOB: 1956/08/07 DOA: 03/05/2024     18 DOS: the patient was seen and examined on 03/25/2024   Brief hospital course:  [name] is a 67 year old female with ESRD on HD MWF, HTN, HLD, DM, PVD, dementia, chronic recurrent pancreatitis, stroke, cervical cancer, HIT, who presents with slurred speech, left facial spasm, left-sided weakness.  Patient had syncopal episode on 6/23 and was seen in the ED.  At that time she was found to have O2 desaturation to 80% due to pulmonary edema on chest x-ray as well as hyperkalemia with a potassium of 6.2.  She had negative head CT during that admission.  She received hemodialysis and then was discharged home.  Her last known normal was 6/24 at 1700.  Upon arrival to the ED patient's confusion had resolved entirely.  Labs in the ED mostly unremarkable, potassium 4.6, creatinine consistent with ESRD.  Blood pressure was found to be 252/79.  Patient was placed in telemetry bed for observation.  Code stroke was called.Blood pressure resolved.  Head CT was without acute intracranial abnormality.  Chronic infarct seen on right parietal lobe and extensive chronic microvascular ischemic changes with remote lacunar infarcts in the right caudate and right thalamus appreciated.  Patient was admitted for workup of presumed TIA.  LDL 96, hemoglobin A1c 5.1%.  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  MRI brain revealed chronic encephalomalacia within the right parietal lobe and advanced diffuse cerebral white matter disease.  Patient underwent carotid Dopplers which revealed elevated velocity within the distal right internal carotid artery suggesting stenosis of 50 to 69% though no significant plaque identified and thus this may be artifactual. Patient underwent hemodialysis on 6/25 but left her session early AMA.she attempted to leave the hospital AMA but during this event began hallucinating  and loss capacity.  Psychiatry was consulted and met with the patient on 6/26 and confirms acute delirium, currently the patient does not have capacity to make medical decisions.  She has been started on Seroquel  for presumed Lewy body dementia.  She has had persistent agitation and Seroquel  has been slowly increased. She is currently medically stable and undergoing routine HD while awaiting safe dispo. TOC is consulted.    BP is labile and high.  Family voiced concern about isosorbide  mononitrate.  Nephrology discontinued isosorbide  and possibly add some other medications.  Patient is currently on losartan , Coreg , clonidine , diltiazem , doxazosin  and the spironolactone .   7/6.  Case discussed with nephrology and switching clonidine  pills over to clonidine  patch.  Imdur  discontinued the day prior.  Family interested in taking patient home rather than going to rehab 7/7.  Patient seen on dialysis and blood pressure on the lower side.  Continue to monitor closely.  Patient complains of pains and was given Tylenol . 7/8.  Patient did not sleep last night.  She was able to answer questions and follow commands for me this morning.  Was sleeping during the day.   I assumed care on 03/20/24.   7/9 - Pt had dialysis. Otherwise uneventful day. 7/10 - new onset nausea/vomiting, leukocytosis, no tolerating PO meds, BP's uncontrolled 7/11 - BP's still significantly elevated. Pt vomited her PO meds yesterday AM.  7/12 - BP's improved with systolic's in 140's to 160's.  No N/V reported.  +Visual hallucinations, but no agitation issues reported since I assumed care. 7/13 -- BP's improved.  No N/V or complaints of stomach issues.   7/14 --  pt refused AM dialysis and PO meds, recurrent stomach pains but no N/V   Assessment and Plan:  Hypertensive urgency BP's remain overall elevated, at times still up at 190's-200's systolic.  Improved with increased dose of Cardizem  7/11.  BP's labile in dialysis. --Continue  clonidine  patch, Coreg , Cardizem  CD, losartan , spironolactone  --Increased Cardizem  from 120 >> 180 mg --Nephrology dc'd doxazosin  -- apparently pt has had issues in the past with this medication in outpatient setting --Titrate regimen as needed  Leukocytosis - Resolved. WBC elevated to 17k on 7/10, suspect reactive in setting of nausea/vomiting.  CXR no pneumonia, abdominal x-ray moderate stool burden. 7/12 - WBC normalized to 9.9.  No fevers. --Blood cultures negative to date - follow to final --Monitor CBC  --Monitor clinically for s/sx's of infection  Nausea and Vomiting - reported overnight 7/9-10 and morning of 7/10 pt vomited after PO meds. She is having BM's, unlikely obstruction.  RN documented emesis was undigested food, ?gastroparesis. Intermittent stomach pains - suspect gas pains from constipation vs ?IBS Abdominal x-ray 7/10 showed moderate stool burden throughout colon, no signs of obstruction. Of note, stools have been mixed, at times hard other times very loose. --Trial simethicone  this AM, will place PRN standing order if effective --Consider trial of Bentyl, but has anticholinergic effects would use with caution --PRN IV antiemetics  Acute delirium With hallucinations.  Issues with wake/sleep schedule since admission, started on Seroquel  with improvement.  Patient may do better in her home environment rather than in the hospital.  Per Dr. Josette, brother planned to speak with other family members about potential for taking patient home with 24/7 care and home health services. --Continued Seroquel  25 mg TID + 75 mg at bedtime --PRN Seroquel  also available for uncontrolled agitation/anxiety.  Dementia with behavioral disturbance (HCC) Patient on Seroquel  and as needed Haldol .   Patient was seen by psychiatry does not have the capacity to leave AMA.   Family discussing plans to take her home rather than SNF.  ESRD (end stage renal disease) [facility]) Nephrology following.  On  dialysis Monday Wednesday Friday  Type II diabetes mellitus with renal manifestations (HCC) Last hemoglobin A1c 5.1  Anemia in ESRD (end-stage renal disease) (HCC) Hbg stable.  Hyperlipidemia Continue Lipitor  TIA (transient ischemic attack) Ruled out.  MRI of the brain shows chronic encephalomalacia within the right parietal lobe and advanced diffuse cerebral white matter disease, carotid Doppler showing elevated velocity in the distal right internal carotid suggesting stenosis of 50 to 69%, EEG showing cortical dysfunction arising from the right temporal parietal region secondary to underlying structural abnormality no seizure or left to form discharges, EF normal range.  Hyponatremia Dialysis for volume management      Subjective: Pt refusing dialysis this AM stating she feels sick.  Pt reporting stomach pain.  Has not been vomiting or reported nausea.  RN reported pt had a BM, and complained of stomach pain.  Pt was taken down for dialysis but was not treated as she was refusing because of her stomach hurting.    Physical Exam: Vitals:   03/25/24 0146 03/25/24 0531 03/25/24 0727 03/25/24 0906  BP: (!) 120/54 (!) 165/66 (!) 158/70 (!) 150/53  Pulse:  68 77 70  Resp:   19 18  Temp:  99.5 F (37.5 C) 98.4 F (36.9 C)   TempSrc:      SpO2:  96% 99% 99%  Weight:      Height:       General exam: awake &  alert, laying in bed HEENT: hearing grossly normal, moist mucus membranes Respiratory system: lungs overall clear without wheezes or rhonchi, normal respiratory effort. Cardiovascular system: RRR, no peripheral edema Gastrointestinal system: soft, NT, ND Central nervous system: A&O x 2. no gross focal neurologic deficits, minimal but normal speech Skin: dry, intact, normal temperature Psychiatry: normal mood, congruent affect, having visual hallucinations   Data Reviewed:  Notable labs --   Na 133, Cl 92, glucose 108, BUN 72, Cr 8.94, gap 16, phos 4.9, albumin  3.4  CBG's 139 >> 106 >> 102   Family Communication: Two sisters at bedside on rounds 7/13 -- they state plan is still for pt to return home & they are arranging in home support for days they cannot be with her.  TOC updated.   Disposition: Status is: Inpatient Remains inpatient appropriate because: re-assessment with PT/OT needed to ensure  Dispo home with family support is feasible.  Otherwise will need SNF placement.  Medically stable.  If going home, plan to d/c Monday after dialysis vs Tuesday AM    Planned Discharge Destination: SNF      Time spent: 42 minutes  Author: Burnard DELENA Cunning, DO 03/25/2024 11:40 AM  For on call review [email or web address].

## 2024-03-25 NOTE — Progress Notes (Signed)
 OT Cancellation Note  Patient Details Name: DESHANNON SEIDE MRN: [id number] DOB: October 19, 1955   Cancelled Treatment:    Reason Eval/Treat Not Completed: Patient declined, no reason specified. Pt stating she feels generally unwell and requesting pain meds, pt refusing to perform OOB mobility despite OT/PT encouragement. RN updated. OT will re-attempt as able.  Vadie Principato L. Rise Traeger, OTR/L  03/25/24, 2:44 PM

## 2024-03-25 NOTE — Progress Notes (Signed)
 Patient alert and oriented x2, complained of nausea PRN IV Phenergan  given with improvement, blood pressure elevated, PRN hydralazine  IV given with improvement, family at bedside, will continue to monitor.

## 2024-03-25 NOTE — Progress Notes (Signed)
 [facility]  ROUNDING NOTE   Subjective:  Patient is known to our practice and receives outpatient dialysis treatments at [facility] on a MWF schedule, supervised by Dr. Dennise.   Update today: Patient seen in dialysis suite States she is having a lot of pain.  She is adamant to not complete dialysis and return to her room  Objective:  Vital signs in last 24 hours:  Temp:  [98.1 F (36.7 C)-99.5 F (37.5 C)] 98.4 F (36.9 C) (07/14 0727) Pulse Rate:  [64-77] 70 (07/14 0906) Resp:  [18-19] 18 (07/14 0906) BP: (120-185)/(52-70) 150/53 (07/14 0906) SpO2:  [96 %-100 %] 99 % (07/14 0906)  Weight change:  Filed Weights   03/22/24 0740 03/22/24 0835 03/22/24 1320  Weight: 41.4 kg 41.4 kg 51.7 kg    Intake/Output: I/O last 3 completed shifts: In: 120 [P.O.:120] Out: -    Intake/Output this shift:  No intake/output data recorded.  Physical Exam: General: NAD, pleasant  Head: Normocephalic  Eyes: Anicteric  Neck: Supple  Lungs:  Normal effort  Heart: Regular rate and rhythm  Abdomen:  Soft, nontender,   Extremities:  No peripheral edema.  Neurologic: Alert and awake  Skin: warm  Access: Left AVF    Basic Metabolic Panel: Recent Labs  Lab 03/20/24 0900 03/22/24 0602 03/25/24 0616  NA 132* 132* 133*  K 4.3 4.3 4.1  CL 95* 92* 92*  CO2 25 26 25   GLUCOSE 116* 104* 108*  BUN 42* 48* 72*  CREATININE 8.74* 7.66* 8.94*  CALCIUM  9.1 9.0 9.1  PHOS 3.6 4.8* 4.9*    Liver Function Tests: Recent Labs  Lab 03/22/24 0602 03/25/24 0616  ALBUMIN 3.0* 3.4*   No results for input(s): LIPASE, AMYLASE in the last 168 hours. No results for input(s): AMMONIA in the last 168 hours.  CBC: Recent Labs  Lab 03/20/24 1452 03/22/24 0602 03/23/24 0943  WBC 17.2* 14.9* 9.9  HGB 10.0* 10.0* 9.9*  HCT 30.9* 30.9* 31.3*  MCV 92.8 94.2 95.1  PLT 155 173 187    Cardiac Enzymes: No results for input(s): CKTOTAL, CKMB, CKMBINDEX,  TROPONINI in the last 168 hours.  BNP: Invalid input(s): POCBNP  CBG: Recent Labs  Lab 03/23/24 0732 03/24/24 0733 03/24/24 0902 03/24/24 1333 03/24/24 2137  GLUCAP 76 68* 139* 106* 102*    Microbiology: Results for orders placed or performed during the hospital encounter of 03/05/24  Culture, blood (Routine X 2) w Reflex to ID Panel     Status: None (Preliminary result)   Collection Time: 03/21/24  5:32 PM   Specimen: BLOOD  Result Value Ref Range Status   Specimen Description BLOOD BLOOD RIGHT HAND  Final   Special Requests   Final    BOTTLES DRAWN AEROBIC AND ANAEROBIC Blood Culture adequate volume   Culture   Final    NO GROWTH 3 DAYS Performed at [facility], [address] [place], [place] [address]    Report Status PENDING  Incomplete  Culture, blood (Routine X 2) w Reflex to ID Panel     Status: None (Preliminary result)   Collection Time: 03/21/24  5:32 PM   Specimen: BLOOD  Result Value Ref Range Status   Specimen Description BLOOD BLOOD RIGHT ARM  Final   Special Requests   Final    BOTTLES DRAWN AEROBIC AND ANAEROBIC Blood Culture adequate volume   Culture   Final    NO GROWTH 3 DAYS Performed at [facility], [address] [place], [place] [address]    Report Status PENDING  Incomplete    Coagulation Studies: No results for input(s): LABPROT, INR in the last 72 hours.  Urinalysis: No results for input(s): COLORURINE, LABSPEC, PHURINE, GLUCOSEU, HGBUR, BILIRUBINUR, KETONESUR, PROTEINUR, UROBILINOGEN, NITRITE, LEUKOCYTESUR in the last 72 hours.  Invalid input(s): APPERANCEUR    Imaging: No results found.   Medications:    promethazine  (PHENERGAN ) injection (IM or IVPB) 12.5 mg (03/25/24 1110)    aspirin   300 mg Rectal Daily   Or   aspirin   325 mg Oral Daily   atorvastatin   40 mg Oral Daily   carvedilol   25 mg Oral BID WC   Chlorhexidine  Gluconate Cloth  6 each Topical Daily    cloNIDine   0.3 mg Transdermal Weekly   diltiazem   180 mg Oral Daily   feeding supplement (NEPRO CARB STEADY)  237 mL Oral TID BM   losartan   100 mg Oral Daily   polyethylene glycol  17 g Oral Daily   QUEtiapine   25 mg Oral TID   QUEtiapine   75 mg Oral Daily   senna-docusate  1 tablet Oral BID   simethicone   40 mg Oral Once   sodium chloride  flush  10-40 mL Intracatheter Q12H   spironolactone   25 mg Oral Daily   traZODone   50 mg Oral QHS   acetaminophen  **OR** acetaminophen  (TYLENOL ) oral liquid 160 mg/5 mL **OR** acetaminophen , bisacodyl , calcium  carbonate, haloperidol  lactate, hydrALAZINE , hydrALAZINE , labetalol , LORazepam , ondansetron  (ZOFRAN ) IV, promethazine  (PHENERGAN ) injection (IM or IVPB), QUEtiapine , sodium chloride  flush, sodium chloride  flush  Assessment/ Plan:  Ms. DARLY MASSI is a 68 y.o.  female  with past medical conditions including end-stage renal disease on hemodialysis, dementia, hypertension, diabetes, hyperlipidemia, cervical cancer, recurrent pancreatitis, PVD and end-stage renal disease on hemodialysis.  Patient presents to the emergency department after experiencing slurred speech, left-sided facial spasms and weakness.  Patient has been admitted for TIA (transient ischemic attack) [G45.9] Hallucination [R44.3]   [facility] [place]/MWF/Lt AVG      End-stage renal disease on hemodialysis. Was scheduled to receive dialysis today, however patient refused treatment. Will attempt treatment in the morning.    Transient ischemic attack, reporting symptoms of slurred speech, involuntary left-sided facial spasms and weakness.  Neurology consulted, TIA versus partial complex seizure.  Brain imaging negative for acute infarct.  Carotid ultrasound shows right-sided 50 to 60% stenosis without plaque.  Symptoms resolved.     3.  Hypertension: hypertensive urgency on admission.  - Patient prescribed carvedilol , clonidine , diltiazem , losartan , and spironolactone .  Hydralazine , labetalol  for PRN use            - Blood pressure stable   4. Secondary Hyperparathyroidism:             - continue sevelamer  with meals             - Monitoring bone mineral this admission   5. Anemia with chronic kidney disease: Mircera as outpatient. Holding ESA due to acute ischemic event.              -Hgb 9.9 at last check.  Will continue to monitor need for ESA.   LOS: 18 Zanobia Griebel 7/14/202511:23 AM

## 2024-03-25 NOTE — TOC Progression Note (Signed)
 Transition of Care [facility]) - Progression Note    Patient Details  Name: Tracy Jennings MRN: [id number] Date of Birth: 07/17/1956  Transition of Care [facility]) CM/SW Contact  Dalia GORMAN Fuse, RN Phone Number: 03/25/2024, 4:26 PM  Clinical Narrative:     TOC spoke with the patient's sister in the room. Per her sister she and another sister, [name], and a friend have worked out a schedule to care for the patient in the home. TOC provided choice for HH and they did not have a preference. TOC will continue to follow.  Expected Discharge Plan: Skilled Nursing Facility Barriers to Discharge: No SNF bed  Expected Discharge Plan and Services   Discharge Planning Services: CM Consult   Living arrangements for the past 2 months: Single Family Home                                       Social Determinants of Health (SDOH) Interventions SDOH Screenings   Food Insecurity: Food Insecurity Present (03/06/2024)  Housing: Low Risk  (03/06/2024)  Transportation Needs: No Transportation Needs (03/06/2024)  Utilities: Not At Risk (03/06/2024)  Financial Resource Strain: Low Risk  (01/09/2024)   Received from [facility]  Social Connections: Unknown (03/06/2024)  Tobacco Use: Low Risk  (03/06/2024)    Readmission Risk Interventions     No data to display

## 2024-03-25 NOTE — Plan of Care (Signed)
  Problem: Education: Goal: Knowledge of disease or condition will improve Outcome: Progressing Goal: Knowledge of secondary prevention will improve (MUST DOCUMENT ALL) Outcome: Progressing Goal: Knowledge of patient specific risk factors will improve (DELETE if not current risk factor) Outcome: Progressing   Problem: Ischemic Stroke/TIA Tissue Perfusion: Goal: Complications of ischemic stroke/TIA will be minimized Outcome: Progressing   Problem: Coping: Goal: Will verbalize positive feelings about self Outcome: Progressing Goal: Will identify appropriate support needs Outcome: Progressing   Problem: Health Behavior/Discharge Planning: Goal: Ability to manage health-related needs will improve Outcome: Progressing Goal: Goals will be collaboratively established with patient/family Outcome: Progressing   Problem: Self-Care: Goal: Ability to participate in self-care as condition permits will improve Outcome: Progressing Goal: Verbalization of feelings and concerns over difficulty with self-care will improve Outcome: Progressing Goal: Ability to communicate needs accurately will improve Outcome: Progressing   Problem: Nutrition: Goal: Risk of aspiration will decrease Outcome: Progressing Goal: Dietary intake will improve Outcome: Progressing   Problem: Health Behavior/Discharge Planning: Goal: Ability to manage health-related needs will improve Outcome: Progressing   Problem: Clinical Measurements: Goal: Ability to maintain clinical measurements within normal limits will improve Outcome: Progressing Goal: Will remain free from infection Outcome: Progressing Goal: Diagnostic test results will improve Outcome: Progressing Goal: Respiratory complications will improve Outcome: Progressing Goal: Cardiovascular complication will be avoided Outcome: Progressing   Problem: Activity: Goal: Risk for activity intolerance will decrease Outcome: Progressing   Problem:  Nutrition: Goal: Adequate nutrition will be maintained Outcome: Progressing   Problem: Coping: Goal: Level of anxiety will decrease Outcome: Progressing   Problem: Elimination: Goal: Will not experience complications related to bowel motility Outcome: Progressing Goal: Will not experience complications related to urinary retention Outcome: Progressing   Problem: Pain Managment: Goal: General experience of comfort will improve and/or be controlled Outcome: Progressing   Problem: Safety: Goal: Ability to remain free from injury will improve Outcome: Progressing   Problem: Skin Integrity: Goal: Risk for impaired skin integrity will decrease Outcome: Progressing

## 2024-03-25 NOTE — TOC Progression Note (Signed)
 Transition of Care [facility]) - Progression Note    Patient Details  Name: Tracy Jennings MRN: [id number] Date of Birth: 1956/05/10  Transition [facility]) CM/SW Contact  Dalia GORMAN Fuse, RN Phone Number: 03/25/2024, 12:04 PM  Clinical Narrative:    TOC received a call from the patient's brother, [name] [phone]. TOC explained the patient has a STR bed offer from [employer] and shared the location of the facility. [name] advised the patient's niece typically makes the decisions. TOC asked about the patient's son, Dijuan, and he advised he doesn't know where he is. TOC outreached to Dijuan [phone] and  her niece, April [phone] and left a voicemail message requesting a callback.  TOC will continue to follow.   Expected Discharge Plan: Skilled Nursing Facility Barriers to Discharge: No SNF bed  Expected Discharge Plan and Services   Discharge Planning Services: CM Consult   Living arrangements for the past 2 months: Single Family Home                                       Social Determinants of Health (SDOH) Interventions SDOH Screenings   Food Insecurity: Food Insecurity Present (03/06/2024)  Housing: Low Risk  (03/06/2024)  Transportation Needs: No Transportation Needs (03/06/2024)  Utilities: Not At Risk (03/06/2024)  Financial Resource Strain: Low Risk  (01/09/2024)   Received from [facility]  Social Connections: Unknown (03/06/2024)  Tobacco Use: Low Risk  (03/06/2024)    Readmission Risk Interventions     No data to display

## 2024-03-26 DIAGNOSIS — I160 Hypertensive urgency: Secondary | ICD-10-CM | POA: Diagnosis not present

## 2024-03-26 LAB — CULTURE, BLOOD (ROUTINE X 2)
Culture: NO GROWTH
Culture: NO GROWTH
Special Requests: ADEQUATE
Special Requests: ADEQUATE

## 2024-03-26 LAB — GLUCOSE, CAPILLARY: Glucose-Capillary: 86 mg/dL (ref 70–99)

## 2024-03-26 MED ORDER — HYDROMORPHONE HCL 1 MG/ML IJ SOLN
0.5000 mg | Freq: Once | INTRAMUSCULAR | Status: AC
  Administered 2024-03-26: 0.5 mg via INTRAVENOUS
  Filled 2024-03-26: qty 0.5

## 2024-03-26 NOTE — Progress Notes (Signed)
 Progress Note   Patient: Tracy Jennings FMW:[id number] DOB: 09-24-1955 DOA: 03/05/2024     19 DOS: the patient was seen and examined on 03/26/2024   Brief hospital course:  ELVA MAURO is a 68 year old female with ESRD on HD MWF, HTN, HLD, DM, PVD, dementia, chronic recurrent pancreatitis, stroke, cervical cancer, HIT, who presents with slurred speech, left facial spasm, left-sided weakness.  Patient had syncopal episode on 6/23 and was seen in the ED.  At that time she was found to have O2 desaturation to 80% due to pulmonary edema on chest x-ray as well as hyperkalemia with a potassium of 6.2.  She had negative head CT during that admission.  She received hemodialysis and then was discharged home.  Her last known normal was 6/24 at 1700.  Upon arrival to the ED patient's confusion had resolved entirely.  Labs in the ED mostly unremarkable, potassium 4.6, creatinine consistent with ESRD.  Blood pressure was found to be 252/79.  Patient was placed in telemetry bed for observation.  Code stroke was called.Blood pressure resolved.  Head CT was without acute intracranial abnormality.  Chronic infarct seen on right parietal lobe and extensive chronic microvascular ischemic changes with remote lacunar infarcts in the right caudate and right thalamus appreciated.  Patient was admitted for workup of presumed TIA.  LDL 96, hemoglobin A1c 5.1%.  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  MRI brain revealed chronic encephalomalacia within the right parietal lobe and advanced diffuse cerebral white matter disease.  Patient underwent carotid Dopplers which revealed elevated velocity within the distal right internal carotid artery suggesting stenosis of 50 to 69% though no significant plaque identified and thus this may be artifactual. Patient underwent hemodialysis on 6/25 but left her session early AMA.she attempted to leave the hospital AMA but during this event began hallucinating  and loss capacity.  Psychiatry was consulted and met with the patient on 6/26 and confirms acute delirium, currently the patient does not have capacity to make medical decisions.  She has been started on Seroquel  for presumed Lewy body dementia.  She has had persistent agitation and Seroquel  has been slowly increased. She is currently medically stable and undergoing routine HD while awaiting safe dispo. TOC is consulted.    BP is labile and high.  Family voiced concern about isosorbide  mononitrate.  Nephrology discontinued isosorbide  and possibly add some other medications.  Patient is currently on losartan , Coreg , clonidine , diltiazem , doxazosin  and the spironolactone .   7/6.  Case discussed with nephrology and switching clonidine  pills over to clonidine  patch.  Imdur  discontinued the day prior.  Family interested in taking patient home rather than going to rehab 7/7.  Patient seen on dialysis and blood pressure on the lower side.  Continue to monitor closely.  Patient complains of pains and was given Tylenol . 7/8.  Patient did not sleep last night.  She was able to answer questions and follow commands for me this morning.  Was sleeping during the day.   I assumed care on 03/20/24.   7/9 - Pt had dialysis. Otherwise uneventful day. 7/10 - new onset nausea/vomiting, leukocytosis, no tolerating PO meds, BP's uncontrolled 7/11 - BP's still significantly elevated. Pt vomited her PO meds yesterday AM.  7/12 - BP's improved with systolic's in 140's to 160's.  No N/V reported.  +Visual hallucinations, but no agitation issues reported since I assumed care. 7/13 -- BP's improved.  No N/V or complaints of stomach issues.   7/14 --  pt refused AM dialysis and PO meds, recurrent stomach pains but no N/V. Normal lipase 7/15 -- no acute issues, ongoing abdominal pain from constipation/gas pains   Assessment and Plan:  Hypertensive urgency BP's remain overall elevated, at times still up at 190's-200's  systolic.  Improved with increased dose of Cardizem  7/11.  BP's labile in dialysis. --Continue clonidine  patch, Coreg , Cardizem  CD, losartan , spironolactone  --Increased Cardizem  from 120 >> 180 mg --Nephrology dc'd doxazosin  -- apparently pt has had issues in the past with this medication in outpatient setting --Titrate regimen as needed  Leukocytosis - Resolved. WBC elevated to 17k on 7/10, suspect reactive in setting of nausea/vomiting.  CXR no pneumonia, abdominal x-ray moderate stool burden. 7/12 - WBC normalized to 9.9.  No fevers. --Blood cultures negative to date - follow to final --Monitor CBC  --Monitor clinically for s/sx's of infection  Nausea and Vomiting - reported overnight 7/9-10 and morning of 7/10 pt vomited after PO meds. She is having BM's, unlikely obstruction.  RN documented emesis was undigested food, ?gastroparesis. Intermittent stomach pains - suspect gas pains from constipation vs ?IBS Abdominal x-ray 7/10 showed moderate stool burden throughout colon, no signs of obstruction. Of note, stools have been mixed, at times hard other times very loose. --Trial simethicone  this AM, will place PRN standing order if effective --Consider trial of Bentyl, but has anticholinergic effects would use with caution --PRN IV antiemetics  Acute delirium With hallucinations.  Issues with wake/sleep schedule since admission, started on Seroquel  with improvement.  Patient may do better in her home environment rather than in the hospital.  Per Dr. Josette, brother planned to speak with other family members about potential for taking patient home with 24/7 care and home health services. --Continued Seroquel  25 mg TID + 75 mg at bedtime --PRN Seroquel  also available for uncontrolled agitation/anxiety.  Dementia with behavioral disturbance (HCC) Patient on Seroquel  and as needed Haldol , per psychiatry Patient was seen by psychiatry does not have the capacity to leave AMA.  Seen by  psychiatry on 03/13/24   ESRD (end stage renal disease) [facility]) Nephrology following.  On dialysis Monday Wednesday Friday  Type II diabetes mellitus with renal manifestations (HCC) Last hemoglobin A1c 5.1  Anemia in ESRD (end-stage renal disease) (HCC) Hbg stable.  Hyperlipidemia Continue Lipitor  TIA (transient ischemic attack) Ruled out.  MRI of the brain shows chronic encephalomalacia within the right parietal lobe and advanced diffuse cerebral white matter disease, carotid Doppler showing elevated velocity in the distal right internal carotid suggesting stenosis of 50 to 69%, EEG showing cortical dysfunction arising from the right temporal parietal region secondary to underlying structural abnormality no seizure or left to form discharges, EF normal range.  Hyponatremia Dialysis for volume management      Subjective: Pt seen today with sister at bedside.  Pt having abdominal pain again today and reports she thinks it is from constipation.  No nausea/vomiting or other acute complaints.  Pt is now agreeable to go to rehab to get stronger before going home.  Sister present states agreement as long as pt wants to go.  Pt confirms agreement.   Patient's mental status has been better the past couple of days, no reports of or obvious signs of hallucinations.     Physical Exam: Vitals:   03/25/24 1808 03/25/24 1937 03/26/24 0511 03/26/24 0738  BP: (!) 155/55 (!) 176/60 (!) 183/66 (!) 148/73  Pulse: 73 75 76 81  Resp:  16 12 20   Temp:  98.4 F (  36.9 C)  98.2 F (36.8 C)  TempSrc:      SpO2:  95% 100% 97%  Weight:      Height:       General exam: awake, alert, no acute distress, talkative HEENT: moist mucus membranes, hearing grossly normal  Respiratory system: on room air, normal respiratory effort. Cardiovascular system: normal S1/S2, RRR, no pedal edema.   Gastrointestinal system: soft, NT, ND, no HSM felt, +bowel sounds. Central nervous system: A&O x 2. no gross focal  neurologic deficits, normal speech Extremities: moves all , no edema, normal tone Psychiatry: normal mood, congruent affect    Data Reviewed:  Notable labs --   Na 133, Cl 92, glucose 108, BUN 72, Cr 8.94, gap 16, phos 4.9, albumin 3.4  CBG's 139 >> 106 >> 102 >> 107 >> 86   Family Communication: Sister at bedside on rounds today   Disposition: Status is: Inpatient Remains inpatient appropriate because: needs SNF placement    Planned Discharge Destination: SNF      Time spent: 42 minutes  Author: Burnard DELENA Cunning, DO 03/26/2024 2:36 PM  For on call review [email or web address].

## 2024-03-26 NOTE — Plan of Care (Signed)
  Problem: Education: Goal: Knowledge of secondary prevention will improve (MUST DOCUMENT ALL) Outcome: Progressing   Problem: Education: Goal: Knowledge of patient specific risk factors will improve (DELETE if not current risk factor) Outcome: Progressing   Problem: Ischemic Stroke/TIA Tissue Perfusion: Goal: Complications of ischemic stroke/TIA will be minimized Outcome: Progressing

## 2024-03-26 NOTE — Progress Notes (Signed)
 Physical Therapy Treatment Patient Details Name: Tracy Jennings MRN: [id number] DOB: 1956-05-22 Today's Date: 03/26/2024   History of Present Illness Pt is a 68 year old female presents with slurred speech,  left facial spasm, left sided weakness.    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    PT Comments  Pt received in bed, sister at bedside. Modified session due to fatigue and B knee pain limiting standing tolerance. Pt continues to require Min/ModA for bed mobility, fair static sitting balance at EOB with minimal c/o dizziness. Sit>stand at [facility] with ModA on second attempt. Pt only able to side step a few steps with heavy support of RW prior to B knees buckling and pt safely assisted back to sitting position on edge of bed. Pt is currently not at baseline level of function and does not appear safe to return home. Initial recs for STR at d/c remain appropriate. Will continue PT acutely per POC.    If plan is discharge home, recommend the following: Assistance with cooking/housework;Assist for transportation;Help with stairs or ramp for entrance;Supervision due to cognitive status;Two people to help with walking and/or transfers;Two people to help with bathing/dressing/bathroom   Can travel by private vehicle     No  Equipment Recommendations  Rolling walker (2 wheels)    Recommendations for Other Services       Precautions / Restrictions Precautions Precautions: Fall Recall of Precautions/Restrictions: Impaired Precaution/Restrictions Comments: L UE AV fistula; R chest port Restrictions Weight Bearing Restrictions Per Provider Order: No     Mobility  Bed Mobility Overal bed mobility: Needs Assistance Bed Mobility: Sit to Supine, Supine to Sit     Supine to sit: Min assist, HOB elevated, Used rails Sit to supine: Min assist, HOB elevated, Used rails         Transfers Overall transfer level: Needs assistance Equipment used: Rolling walker (2 wheels) Transfers: Sit to/from Stand Sit to Stand: Mod assist, From elevated surface           General transfer comment: Pt took 3 side steps up towards HOB prior to knees giving out and requiring assist to safely sit on EOB    Ambulation/Gait               General Gait Details: Unable to safely tolerate this date   Stairs             Wheelchair Mobility     Tilt Bed    Modified Rankin (Stroke Patients Only)       Balance                                            Communication Communication Communication: No apparent difficulties  Cognition Arousal: Lethargic, Alert Behavior During Therapy: Flat affect   PT - Cognitive impairments: History of cognitive impairments, No family/caregiver present to determine baseline                         Following commands: Impaired Following commands impaired: Follows one step commands with increased time    Cueing Cueing Techniques: Verbal cues, Gestural cues, Tactile cues, Visual cues  Exercises Other Exercises Other Exercises: edu pt/sister re: role of PT, role of rehab, importance of progressing mobility attempts    General Comments  General comments (skin integrity, edema, etc.): B Knee pain limiting tolerance      Pertinent Vitals/Pain Pain Assessment Pain Assessment: 0-10 Pain Score: 5  Pain Location: B knees Pain Descriptors / Indicators: Discomfort, Aching Pain Intervention(s): Monitored during session    Home Living                          Prior Function            PT Goals (current goals can now be found in the care plan section) Acute Rehab PT Goals Patient Stated Goal: to go home    Frequency    Min 2X/week      PT Plan      Co-evaluation              AM-PAC PT 6 Clicks Mobility   Outcome Measure  Help needed turning from your  back to your side while in a flat bed without using bedrails?: A Lot Help needed moving from lying on your back to sitting on the side of a flat bed without using bedrails?: A Lot Help needed moving to and from a bed to a chair (including a wheelchair)?: Total Help needed standing up from a chair using your arms (e.g., wheelchair or bedside chair)?: Total Help needed to walk in hospital room?: Total Help needed climbing 3-5 steps with a railing? : Total 6 Click Score: 8    End of Session Equipment Utilized During Treatment: Gait belt Activity Tolerance: Patient limited by lethargy;Patient limited by fatigue Patient left: in bed;with call bell/phone within reach;with bed alarm set;with family/visitor present Nurse Communication: Mobility status;Precautions PT Visit Diagnosis: Muscle weakness (generalized) (M62.81);Difficulty in walking, not elsewhere classified (R26.2) Pain - part of body: Knee (B Knees)     Time: 8477-8463 PT Time Calculation (min) (ACUTE ONLY): 14 min  Charges:    $Therapeutic Activity: 8-22 mins PT General Charges $$ ACUTE PT VISIT: 1 Visit                    Darice Bohr, PTA  Darice JAYSON Bohr 03/26/2024, 3:40 PM

## 2024-03-26 NOTE — Plan of Care (Signed)

## 2024-03-26 NOTE — Progress Notes (Signed)
 Occupational Therapy Treatment Patient Details Name: Tracy Jennings MRN: [id number] DOB: 1955/11/05 Today's Date: 03/26/2024   History of present illness Pt is a 68 year old female presents with slurred speech,  left facial spasm, left sided weakness.    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   OT comments  Chart reviewed to date, pt greeted in bed with sister present. Pt is agreeable to OT tx session with encouragement targeting improving functional activity tolerance in prep for ADL tasks. Improvements noted on this date with pt performing bed mobility with MIN A, STS with MIN-MOD A +2 with RW; frequent multi modal cues for technique. Limited static standing tolerance to approx 10 seconds. Pt continues to make progress towards goals, discharge recommendation remains appropriate. OT will continue to follow.       If plan is discharge home, recommend the following:  A little help with walking and/or transfers;A little help with bathing/dressing/bathroom;Assistance with cooking/housework;Direct supervision/assist for medications management;Direct supervision/assist for financial management;Supervision due to cognitive status;Help with stairs or ramp for entrance;Assist for transportation   Equipment Recommendations  BSC/3in1    Recommendations for Other Services      Precautions / Restrictions Precautions Precautions: Fall Recall of Precautions/Restrictions: Impaired Precaution/Restrictions Comments: L UE AV fistula; R chest port Restrictions Weight Bearing Restrictions Per Provider Order: No       Mobility Bed Mobility Overal bed mobility: Needs Assistance Bed Mobility: Sit to Supine, Supine to Sit     Supine to sit: Min assist, HOB elevated, Used rails Sit to supine: Min assist, HOB elevated, Used rails        Transfers Overall transfer level:  Needs assistance Equipment used: Rolling walker (2 wheels) Transfers: Sit to/from Stand Sit to Stand: Min assist, Mod assist, +2 physical assistance (with frequent multi modal cues with RW)                 Balance Overall balance assessment: Needs assistance Sitting-balance support: No upper extremity supported, Feet supported Sitting balance-Leahy Scale: Fair     Standing balance support: Bilateral upper extremity supported, During functional activity, Reliant on assistive device for balance Standing balance-Leahy Scale: Poor                             ADL either performed or assessed with clinical judgement   ADL Overall ADL's : Needs assistance/impaired                         Toilet Transfer: Minimal assistance;+2 for safety/equipment;Rolling walker (2 wheels) Toilet Transfer Details (indicate cue type and reason): simulated with frequent multi modal cues Toileting- Clothing Manipulation and Hygiene: Maximal assistance              Extremity/Trunk Assessment              Vision       Perception     Praxis     Communication Communication Communication: No apparent difficulties   Cognition Arousal: Alert Behavior During Therapy: Flat affect Cognition: Cognition impaired   Orientation impairments: Situation, Time Awareness: Online awareness impaired, Intellectual awareness impaired Memory impairment (select all impairments): Short-term memory, Declarative long-term memory Attention impairment (select first level of impairment): Sustained attention Executive functioning impairment (select all impairments): Reasoning, Problem solving  Following commands: Impaired Following commands impaired: Follows one step commands with increased time (and multi modal cues)      Cueing   Cueing Techniques: Verbal cues, Gestural cues, Tactile cues, Visual cues  Exercises Other Exercises Other Exercises: edu pt/sister  re: role of OT, role of rehab, importance of progressing mobility attempts    Shoulder Instructions       General Comments vss throughout    Pertinent Vitals/ Pain       Pain Assessment Pain Assessment: 0-10 Pain Score: 5  Pain Location: B knees Pain Descriptors / Indicators: Discomfort, Aching Pain Intervention(s): Monitored during session, Repositioned  Home Living                                          Prior Functioning/Environment              Frequency  Min 2X/week        Progress Toward Goals  OT Goals(current goals can now be found in the care plan section)  Progress towards OT goals: Progressing toward goals  Acute Rehab OT Goals Time For Goal Achievement: 04/15/24  Plan      Co-evaluation                 AM-PAC OT 6 Clicks Daily Activity     Outcome Measure   Help from another person eating meals?: A Little Help from another person taking care of personal grooming?: A Little Help from another person toileting, which includes using toliet, bedpan, or urinal?: A Lot Help from another person bathing (including washing, rinsing, drying)?: A Lot Help from another person to put on and taking off regular upper body clothing?: A Little Help from another person to put on and taking off regular lower body clothing?: A Little 6 Click Score: 16    End of Session Equipment Utilized During Treatment: Rolling walker (2 wheels)  OT Visit Diagnosis: Other abnormalities of gait and mobility (R26.89);Muscle weakness (generalized) (M62.81);Cognitive communication deficit (R41.841)   Activity Tolerance Patient tolerated treatment well   Patient Left in bed;with call bell/phone within reach;with bed alarm set   Nurse Communication Patient requests pain meds        Time: 8583-8571 OT Time Calculation (min): 12 min  Charges: OT General Charges $OT Visit: 1 Visit OT Treatments $Therapeutic Activity: 8-22 mins  Therisa Sheffield, OTD  OTR/L  03/26/24, 3:30 PM

## 2024-03-26 NOTE — Plan of Care (Signed)
  Problem: Education: Goal: Knowledge of secondary prevention will improve (MUST DOCUMENT ALL) 03/26/2024 0648 by Mcneil Eleanor POUR, RN Outcome: Progressing 03/26/2024 0245 by Mcneil Eleanor POUR, RN Outcome: Progressing   Problem: Education: Goal: Knowledge of patient specific risk factors will improve (DELETE if not current risk factor) 03/26/2024 0648 by Mcneil Eleanor POUR, RN Outcome: Progressing 03/26/2024 0245 by Mcneil Eleanor POUR, RN Outcome: Progressing   Problem: Ischemic Stroke/TIA Tissue Perfusion: Goal: Complications of ischemic stroke/TIA will be minimized 03/26/2024 0648 by Jaia Alonge K, RN Outcome: Progressing 03/26/2024 0245 by Mcneil Eleanor POUR, RN Outcome: Progressing

## 2024-03-26 NOTE — Progress Notes (Signed)
 Per NP-Breeze, pt dialysis will be held today.

## 2024-03-26 NOTE — Progress Notes (Addendum)
 [facility]  ROUNDING NOTE   Subjective:  Patient is known to our practice and receives outpatient dialysis treatments at [facility] on a MWF schedule, supervised by Dr. Dennise.   Update   Patient seen sitting in bed Family at bedside Alert and oriented to self and place Remains on room air with no lower extremity edema  Objective:  Vital signs in last 24 hours:  Temp:  [98.2 F (36.8 C)-98.4 F (36.9 C)] 98.2 F (36.8 C) (07/15 0738) Pulse Rate:  [72-81] 81 (07/15 0738) Resp:  [12-20] 20 (07/15 0738) BP: (148-192)/(55-73) 148/73 (07/15 0738) SpO2:  [95 %-100 %] 97 % (07/15 0738)  Weight change:  Filed Weights   03/22/24 0740 03/22/24 0835 03/22/24 1320  Weight: 41.4 kg 41.4 kg 51.7 kg    Intake/Output: I/O last 3 completed shifts: In: 124.8 [P.O.:30; IV Piggyback:94.8] Out: -    Intake/Output this shift:  Total I/O In: 240 [P.O.:240] Out: -   Physical Exam: General: NAD, pleasant  Head: Normocephalic  Eyes: Anicteric  Neck: Supple  Lungs:  Normal effort  Heart: Regular rate and rhythm  Abdomen:  Soft, nontender,   Extremities:  No peripheral edema.  Neurologic: Alert and awake  Skin: warm  Access: Left AVF    Basic Metabolic Panel: Recent Labs  Lab 03/20/24 0900 03/22/24 0602 03/25/24 0616  NA 132* 132* 133*  K 4.3 4.3 4.1  CL 95* 92* 92*  CO2 25 26 25   GLUCOSE 116* 104* 108*  BUN 42* 48* 72*  CREATININE 8.74* 7.66* 8.94*  CALCIUM  9.1 9.0 9.1  PHOS 3.6 4.8* 4.9*    Liver Function Tests: Recent Labs  Lab 03/22/24 0602 03/25/24 0616 03/25/24 1531  AST  --   --  13*  ALT  --   --  9  ALKPHOS  --   --  36*  BILITOT  --   --  0.5  PROT  --   --  6.8  ALBUMIN 3.0* 3.4* 3.2*   Recent Labs  Lab 03/25/24 1531  LIPASE 30   No results for input(s): AMMONIA in the last 168 hours.  CBC: Recent Labs  Lab 03/20/24 1452 03/22/24 0602 03/23/24 0943  WBC 17.2* 14.9* 9.9  HGB 10.0* 10.0* 9.9*  HCT 30.9* 30.9*  31.3*  MCV 92.8 94.2 95.1  PLT 155 173 187    Cardiac Enzymes: No results for input(s): CKTOTAL, CKMB, CKMBINDEX, TROPONINI in the last 168 hours.  BNP: Invalid input(s): POCBNP  CBG: Recent Labs  Lab 03/24/24 0902 03/24/24 1333 03/24/24 2137 03/25/24 1220 03/26/24 0748  GLUCAP 139* 106* 102* 107* 86    Microbiology: Results for orders placed or performed during the hospital encounter of 03/05/24  Culture, blood (Routine X 2) w Reflex to ID Panel     Status: None   Collection Time: 03/21/24  5:32 PM   Specimen: BLOOD  Result Value Ref Range Status   Specimen Description BLOOD BLOOD RIGHT HAND  Final   Special Requests   Final    BOTTLES DRAWN AEROBIC AND ANAEROBIC Blood Culture adequate volume   Culture   Final    NO GROWTH 5 DAYS Performed at [facility], [address] [place], [place] [address]    Report Status 03/26/2024 FINAL  Final  Culture, blood (Routine X 2) w Reflex to ID Panel     Status: None   Collection Time: 03/21/24  5:32 PM   Specimen: BLOOD  Result Value Ref Range Status  Specimen Description BLOOD BLOOD RIGHT ARM  Final   Special Requests   Final    BOTTLES DRAWN AEROBIC AND ANAEROBIC Blood Culture adequate volume   Culture   Final    NO GROWTH 5 DAYS Performed at [facility], [address] [place], [place] [address]    Report Status 03/26/2024 FINAL  Final    Coagulation Studies: No results for input(s): LABPROT, INR in the last 72 hours.  Urinalysis: No results for input(s): COLORURINE, LABSPEC, PHURINE, GLUCOSEU, HGBUR, BILIRUBINUR, KETONESUR, PROTEINUR, UROBILINOGEN, NITRITE, LEUKOCYTESUR in the last 72 hours.  Invalid input(s): APPERANCEUR    Imaging: No results found.   Medications:    promethazine  (PHENERGAN ) injection (IM or IVPB) Stopped (03/25/24 1130)    aspirin   300 mg Rectal Daily   Or   aspirin   325 mg Oral Daily   atorvastatin   40 mg Oral Daily    carvedilol   25 mg Oral BID WC   Chlorhexidine  Gluconate Cloth  6 each Topical Daily   cloNIDine   0.3 mg Transdermal Weekly   diltiazem   180 mg Oral Daily   feeding supplement (NEPRO CARB STEADY)  237 mL Oral TID BM   losartan   100 mg Oral Daily   pantoprazole  (PROTONIX ) IV  40 mg Intravenous Q24H   polyethylene glycol  17 g Oral Daily   QUEtiapine   25 mg Oral TID   QUEtiapine   75 mg Oral Daily   senna-docusate  1 tablet Oral BID   sodium chloride  flush  10-40 mL Intracatheter Q12H   spironolactone   25 mg Oral Daily   traZODone   50 mg Oral QHS   acetaminophen  **OR** acetaminophen  (TYLENOL ) oral liquid 160 mg/5 mL **OR** acetaminophen , bisacodyl , calcium  carbonate, haloperidol  lactate, hydrALAZINE , hydrALAZINE , labetalol , LORazepam , ondansetron  (ZOFRAN ) IV, promethazine  (PHENERGAN ) injection (IM or IVPB), QUEtiapine , sodium chloride  flush, sodium chloride  flush  Assessment/ Plan:  Tracy Jennings is a 68 y.o.  female  with past medical conditions including end-stage renal disease on hemodialysis, dementia, hypertension, diabetes, hyperlipidemia, cervical cancer, recurrent pancreatitis, PVD and end-stage renal disease on hemodialysis.  Patient presents to the emergency department after experiencing slurred speech, left-sided facial spasms and weakness.  Patient has been admitted for TIA (transient ischemic attack) [G45.9] Hallucination [R44.3]   [facility]/MWF/Lt AVG      End-stage renal disease on hemodialysis. Patient refused treatment yesterday due to abdominal discomfort.  Will reattempt treatment later today, but without acute distress.  Could consider dialysis tomorrow to maintain outpatient schedule.   Transient ischemic attack, reporting symptoms of slurred speech, involuntary left-sided facial spasms and weakness.  Neurology consulted, TIA versus partial complex seizure.  Brain imaging negative for acute infarct.  Carotid ultrasound shows right-sided 50 to 60%  stenosis without plaque.  Symptoms resolved.     3.  Hypertension: hypertensive urgency on admission.  - Patient prescribed carvedilol , clonidine , diltiazem , losartan , and spironolactone . Hydralazine , labetalol  for PRN use            - Blood pressure 148/73.   4. Secondary Hyperparathyroidism:             - continue sevelamer  with meals             - Calcium  and phosphorus acceptable   5. Anemia with chronic kidney disease: Mircera as outpatient. Holding ESA due to acute ischemic event.              -Hgb 9.9 at last check.     LOS: 19 Ayham Word  7/15/202511:21 AM

## 2024-03-26 NOTE — TOC Progression Note (Addendum)
 Transition of Care [facility]) - Progression Note    Patient Details  Name: Tracy Jennings MRN: [id number] Date of Birth: 04/09/1956  Transition of [facility]) CM/SW Contact  Dalia GORMAN Fuse, RN Phone Number: 03/26/2024, 9:49 AM  Clinical Narrative:    [facility] sent referral for [facility] PT/OT, RN, and Aide to [place] [phone] accepted the referral. [facility] shared request for DME: RW and [facility] with Mitch [phone] from [employer].  TOC spoke with the patient's brother, [name], in the room. Per the brother the patient should go to STR. TOC advised the family that they need to come to a consensus today and let TOC know the decision.   The family has decided on SNF. TOC reached out to [facility] to request auth for [facility].  DC Disposition  SNF  TOC will continue to follow.   Expected Discharge Plan: Skilled Nursing Facility Barriers to Discharge: No SNF bed  Expected Discharge Plan and Services   Discharge Planning Services: CM Consult   Living arrangements for the past 2 months: Single Family Home                                       Social Determinants of Health (SDOH) Interventions SDOH Screenings   Food Insecurity: Food Insecurity Present (03/06/2024)  Housing: Low Risk  (03/06/2024)  Transportation Needs: No Transportation Needs (03/06/2024)  Utilities: Not At Risk (03/06/2024)  Financial Resource Strain: Low Risk  (01/09/2024)   Received from [facility]  Social Connections: Unknown (03/06/2024)  Tobacco Use: Low Risk  (03/06/2024)    Readmission Risk Interventions     No data to display

## 2024-03-27 DIAGNOSIS — I160 Hypertensive urgency: Secondary | ICD-10-CM | POA: Diagnosis not present

## 2024-03-27 LAB — RENAL FUNCTION PANEL
Albumin: 2.8 g/dL — ABNORMAL LOW (ref 3.5–5.0)
Anion gap: 18 — ABNORMAL HIGH (ref 5–15)
BUN: 98 mg/dL — ABNORMAL HIGH (ref 8–23)
CO2: 23 mmol/L (ref 22–32)
Calcium: 8.5 mg/dL — ABNORMAL LOW (ref 8.9–10.3)
Chloride: 89 mmol/L — ABNORMAL LOW (ref 98–111)
Creatinine, Ser: 12.37 mg/dL — ABNORMAL HIGH (ref 0.44–1.00)
GFR, Estimated: 3 mL/min — ABNORMAL LOW (ref >60)
Glucose, Bld: 90 mg/dL (ref 70–99)
Phosphorus: 6.5 mg/dL — ABNORMAL HIGH (ref 2.5–4.6)
Potassium: 4.3 mmol/L (ref 3.5–5.1)
Sodium: 130 mmol/L — ABNORMAL LOW (ref 135–145)

## 2024-03-27 LAB — CBC WITH DIFFERENTIAL/PLATELET
Abs Immature Granulocytes: 0.05 K/uL (ref 0.00–0.07)
Basophils Absolute: 0 K/uL (ref 0.0–0.1)
Basophils Relative: 0 %
Eosinophils Absolute: 0.1 K/uL (ref 0.0–0.5)
Eosinophils Relative: 1 %
HCT: 30.3 % — ABNORMAL LOW (ref 36.0–46.0)
Hemoglobin: 10 g/dL — ABNORMAL LOW (ref 12.0–15.0)
Immature Granulocytes: 0 %
Lymphocytes Relative: 3 %
Lymphs Abs: 0.4 K/uL — ABNORMAL LOW (ref 0.7–4.0)
MCH: 30.5 pg (ref 26.0–34.0)
MCHC: 33 g/dL (ref 30.0–36.0)
MCV: 92.4 fL (ref 80.0–100.0)
Monocytes Absolute: 1 K/uL (ref 0.1–1.0)
Monocytes Relative: 7 %
Neutro Abs: 11.6 K/uL — ABNORMAL HIGH (ref 1.7–7.7)
Neutrophils Relative %: 89 %
Platelets: 205 K/uL (ref 150–400)
RBC: 3.28 MIL/uL — ABNORMAL LOW (ref 3.87–5.11)
RDW: 14.7 % (ref 11.5–15.5)
WBC: 13.2 K/uL — ABNORMAL HIGH (ref 4.0–10.5)
nRBC: 0 % (ref 0.0–0.2)

## 2024-03-27 LAB — GLUCOSE, CAPILLARY: Glucose-Capillary: 94 mg/dL (ref 70–99)

## 2024-03-27 MED ORDER — PANTOPRAZOLE SODIUM 40 MG PO TBEC
40.0000 mg | DELAYED_RELEASE_TABLET | Freq: Every day | ORAL | Status: DC
  Administered 2024-03-27 – 2024-04-02 (×5): 40 mg via ORAL
  Filled 2024-03-27 (×6): qty 1

## 2024-03-27 NOTE — Progress Notes (Signed)
  Received patient in bed to unit.   Informed consent signed and in chart.    TX duration: 3.5 hrs     Transported back  to floor   Hand-off given to patient's nurse. No c/o and no acute distress noted  Right at rinseback pt became unresposive.  Not responding to pain.  Started rinsing back pt , pt pt started yawning and opening eyes.  NP at bedside .  Primary nurse made aware    Access used: L AVG Access issues: none   Total UF removed: 1.4L Medication(s) given: none Post HD VS: wnl Post HD weight: 42.3kg     Olivia Hurst LPN Kid[facility] Dialysis Unit

## 2024-03-27 NOTE — Progress Notes (Signed)
 SLP Cancellation Note  Patient Details Name: Tracy Jennings MRN: [id number] DOB: 03-May-1956   Cancelled treatment:       Reason Eval/Treat Not Completed: Patient at procedure or test/unavailable (Pt OTF for HD. Will continue efforts as appropriate.)  [name], M.S., CCC-SLP Speech-Language Pathologist [facility] [phone] (ASCOM)  [name] 03/27/2024, 9:02 AM

## 2024-03-27 NOTE — Evaluation (Signed)
 Clinical/Bedside Swallow Evaluation Patient Details  Name: Tracy Jennings MRN: [id number] Date of Birth: 06/26/1956  Today's Date: 03/27/2024 Time: SLP Start Time (ACUTE ONLY): 1345 SLP Stop Time (ACUTE ONLY): 1358 SLP Time Calculation (min) (ACUTE ONLY): 13 min  Past Medical History:  Past Medical History:  Diagnosis Date   Anemia 04/2018   low iron. to be started on supplements   Cervical cancer (HCC)    CKD (chronic kidney disease)    Stage IV   Complication of anesthesia    receceived too much anesthesia, that she was in coma for a couple days    COVID-19 virus detected 10/28/2019   Diabetes mellitus without complication (HCC)    type II   ESRD (end stage renal disease) (HCC)    Heart murmur    followed as a child only   HSIL (high grade squamous intraepithelial lesion) on Pap smear of cervix    Hyperlipidemia associated with type 2 diabetes mellitus (HCC)    Hypertension    Pancreatitis    Peripheral vascular disease (HCC)    Past Surgical History:  Past Surgical History:  Procedure Laterality Date   AMPUTATION TOE Left 2013   2nd toe. tip of toe (toe nail was infected)   AV FISTULA PLACEMENT Left 05/11/2018   Procedure: ARTERIOVENOUS (AV) FISTULA CREATION;  Surgeon: Jama Cordella MATSU, MD;  Location: [facility];  Service: Vascular;  Laterality: Left;   CATARACT EXTRACTION     CERVICAL CONIZATION W/BX N/A 04/08/2020   Procedure: CONIZATION CERVIX WITH BIOPSY;  Surgeon: Mancil Barter, MD;  Location: [facility];  Service: Gynecology;  Laterality: N/A;   CHOLECYSTECTOMY  2014   COLONOSCOPY     COLONOSCOPY WITH PROPOFOL  N/A 01/10/2018   Procedure: COLONOSCOPY WITH PROPOFOL ;  Surgeon: Toledo, Ladell POUR, MD;  Location: [facility];  Service: Gastroenterology;  Laterality: N/A;   DIALYSIS/PERMA CATHETER INSERTION N/A 05/21/2018   Procedure: DIALYSIS/PERMA CATHETER INSERTION;  Surgeon: Marea Selinda RAMAN, MD;  Location: [facility];  Service: Cardiovascular;  Laterality:  N/A;   DIALYSIS/PERMA CATHETER INSERTION N/A 07/14/2020   Procedure: DIALYSIS/PERMA CATHETER INSERTION;  Surgeon: Marea Selinda RAMAN, MD;  Location: [facility];  Service: Cardiovascular;  Laterality: N/A;   DIALYSIS/PERMA CATHETER REMOVAL N/A 04/11/2019   Procedure: DIALYSIS/PERMA CATHETER REMOVAL;  Surgeon: Marea Selinda RAMAN, MD;  Location: [facility];  Service: Cardiovascular;  Laterality: N/A;   EYE SURGERY Bilateral 2018   cataract extractions   IR IMAGING GUIDED PORT INSERTION  06/05/2020   THROMBECTOMY W/ EMBOLECTOMY  05/11/2018   Procedure: THROMBECTOMY ARTERIOVENOUS FISTULA;  Surgeon: Jama Cordella MATSU, MD;  Location: [facility];  Service: Vascular;;   TUBAL LIGATION  1984   HPI:  Pt is a 68 year old female presents with slurred speech,  left facial spasm, left sided weakness.    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    Assessment / Plan / Recommendation  Clinical Impression  Pt seen for clincial swallowing evaluation. Evaluation limited by pt's AMS and refusal of purees and solids despite education, encouragement, and choices. Pt demonstrated intermittent oral holding with thin liquids which RN noted occurred this morning with meds with water . Given limited assessment, recommend continuation of current diet with standard aspiration precautions; cues and assistance with feeding, as needed. Should pt demonstrate any increased difficulty chewing/swallowing, please adjust diet as clinically indicated and notify SLP. SLP to f/u next date  for further assessment.  SLP Visit Diagnosis: Dysphagia, oral phase (R13.11)    Aspiration Risk  Risk for inadequate nutrition/hydration;Mild aspiration risk;Moderate aspiration risk    Diet Recommendation Regular;Thin liquid    Medication Administration: Crushed with puree (prefers vanilla pudding) Supervision:  Patient able to self feed;Full supervision/cueing for compensatory strategies Compensations: Minimize environmental distractions;Slow rate;Small sips/bites Postural Changes: Seated upright at 90 degrees;Remain upright for at least 30 minutes after po intake    Other  Recommendations Recommended Consults:  (Consider RD consult) Oral Care Recommendations: Oral care BID;Staff/trained caregiver to provide oral care        Functional Status Assessment Patient has had a recent decline in their functional status and demonstrates the ability to make significant improvements in function in a reasonable and predictable amount of time.  Frequency and Duration min 2x/week  2 weeks       Prognosis Prognosis for improved oropharyngeal function: Fair Barriers to Reach Goals: Cognitive deficits;Severity of deficits      Swallow Study   General Date of Onset: 03/05/24 (admit date) HPI: Pt is a 68 year old female presents with slurred speech,  left facial spasm, left sided weakness.    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 Type of Study: Bedside Swallow Evaluation Previous Swallow Assessment: 2019 - mech soft, thin per EMR Diet Prior to this Study: Regular;Thin liquids (Level 0) Temperature Spikes Noted: No Respiratory Status: Room air History of Recent Intubation: No Behavior/Cognition: Alert;Confused;Doesn't follow directions Oral Cavity Assessment: Within Functional Limits Oral Cavity - Dentition: Poor condition Vision: Functional for self-feeding Self-Feeding Abilities: Able to feed self (needs cueing) Patient Positioning: Upright in bed Baseline Vocal Quality: Normal Volitional Cough: Cognitively unable to elicit Volitional Swallow: Unable to elicit    Oral/Motor/Sensory Function Overall Oral Motor/Sensory Function:  (UTA)   Ice Chips Ice chips: Not tested    Thin Liquid Thin Liquid: Impaired Presentation: Cup;Straw Oral Phase Impairments: Poor awareness of bolus Oral Phase Functional Implications: Oral holding Pharyngeal  Phase Impairments:  (WFL)    Nectar Thick Nectar Thick Liquid: Not tested   Honey Thick Honey Thick Liquid: Not tested   Puree Puree: Not tested Other Comments: pt refused despite education and encouragement   Solid     Solid: Not tested Other Comments: pt refused despite education and encouragement     [name], M.S., CCC-SLP Speech-Language Pathologist [facility] [phone] (ASCOM)  Delon HERO Vona Whiters 03/27/2024,2:10 PM

## 2024-03-27 NOTE — Progress Notes (Signed)
 PHARMACIST - PHYSICIAN COMMUNICATION  CONCERNING: IV to Oral Route Change Policy  RECOMMENDATION: This patient is receiving pantoprazole  by the intravenous route.  Based on criteria approved by the Pharmacy and Therapeutics Committee, the intravenous medication(s) is/are being converted to the equivalent oral dose form(s).  DESCRIPTION: These criteria include: The patient is eating (either orally or via tube) and/or has been taking other orally administered medications for a least 24 hours The patient has no evidence of active gastrointestinal bleeding or impaired GI absorption (gastrectomy, short bowel, patient on TNA or NPO).  If you have questions about this conversion, please contact the Pharmacy Department  []   [phone] )  Tracy Jennings [x]   [phone] )  Tracy Jennings (Dp/Snf) []   [phone] )  [facility] []   [phone] )  [facility] []   [phone] )  [facility]    Thank you for involving pharmacy in this patient's care.   Damien Napoleon, PharmD Clinical Pharmacist 03/27/2024 9:28 AM

## 2024-03-27 NOTE — Progress Notes (Signed)
 Progress Note   Patient: Tracy Jennings FMW:[id number] DOB: 1956/06/15 DOA: 03/05/2024     20 DOS: the patient was seen and examined on 03/27/2024  Brief hospital course:   Tracy Jennings is a 68 year old female with ESRD on HD MWF, HTN, HLD, DM, PVD, dementia, chronic recurrent pancreatitis, stroke, cervical cancer, HIT, who presents with slurred speech, left facial spasm, left-sided weakness.  Patient had syncopal episode on 6/23 and was seen in the ED.  At that time she was found to have O2 desaturation to 80% due to pulmonary edema on chest x-ray as well as hyperkalemia with a potassium of 6.2.  She had negative head CT during that admission.  She received hemodialysis and then was discharged home.  Her last known normal was 6/24 at 1700.  Upon arrival to the ED patient's confusion had resolved entirely.  Labs in the ED mostly unremarkable, potassium 4.6, creatinine consistent with ESRD.  Blood pressure was found to be 252/79.  Patient was placed in telemetry bed for observation.  Code stroke was called.Blood pressure resolved.  Head CT was without acute intracranial abnormality.  Chronic infarct seen on right parietal lobe and extensive chronic microvascular ischemic changes with remote lacunar infarcts in the right caudate and right thalamus appreciated.  Patient was admitted for workup of presumed TIA.  LDL 96, hemoglobin A1c 5.1%.  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  MRI brain revealed chronic encephalomalacia within the right parietal lobe and advanced diffuse cerebral white matter disease.  Patient underwent carotid Dopplers which revealed elevated velocity within the distal right internal carotid artery suggesting stenosis of 50 to 69% though no significant plaque identified and thus this may be artifactual. Patient underwent hemodialysis on 6/25 but left her session early AMA.she attempted to leave the hospital AMA but during this event began hallucinating  and loss capacity.  Psychiatry was consulted and met with the patient on 6/26 and confirms acute delirium, currently the patient does not have capacity to make medical decisions.  She has been started on Seroquel  for presumed Lewy body dementia.  She has had persistent agitation and Seroquel  has been slowly increased. She is currently medically stable and undergoing routine HD while awaiting safe dispo. TOC is consulted.    BP is labile and high.  Family voiced concern about isosorbide  mononitrate.  Nephrology discontinued isosorbide  and possibly add some other medications.  Patient is currently on losartan , Coreg , clonidine , diltiazem , doxazosin  and the spironolactone .   7/6.  Case discussed with nephrology and switching clonidine  pills over to clonidine  patch.  Imdur  discontinued the day prior.  Family interested in taking patient home rather than going to rehab 7/7.  Patient seen on dialysis and blood pressure on the lower side.  Continue to monitor closely.  Patient complains of pains and was given Tylenol . 7/8.  Patient did not sleep last night.  She was able to answer questions and follow commands for me this morning.  Was sleeping during the day.    7/9 - Pt had dialysis. Otherwise uneventful day. 7/10 - new onset nausea/vomiting, leukocytosis, no tolerating PO meds, BP's uncontrolled 7/11 - BP's still significantly elevated. Pt vomited her PO meds yesterday AM.  7/12 - BP's improved with systolic's in 140's to 160's.  No N/V reported.  +Visual hallucinations, but no agitation issues reported since I assumed care. 7/13 -- BP's improved.  No N/V or complaints of stomach issues.   7/14 -- pt refused AM dialysis and  PO meds, recurrent stomach pains but no N/V. Normal lipase 7/15 -- no acute issues, ongoing abdominal pain from constipation/gas pains     Assessment and Plan:   Hypertensive urgency BP's remain overall elevated, at times still up at 190's-200's systolic.  Improved with  increased dose of Cardizem  7/11.  BP's labile in dialysis. Continue clonidine  patch, Coreg , Cardizem  CD, losartan , spironolactone  --Increased Cardizem  from 120 >> 180 mg --Nephrology dc'd doxazosin  -- apparently pt has had issues in the past with this medication in outpatient setting --Titrate regimen as needed   Leukocytosis - Resolved. WBC elevated to 17k on 7/10, suspect reactive in setting of nausea/vomiting.  CXR no pneumonia, abdominal x-ray moderate stool burden. 7/12 - WBC normalized to 9.9.  No fevers. --Blood cultures negative to date - follow to final Monitor CBC --Monitor clinically for s/sx's of infection   Nausea and Vomiting - reported overnight 7/9-10 and morning of 7/10 pt vomited after PO meds. She is having BM's, unlikely obstruction.  RN documented emesis was undigested food, ?gastroparesis. Intermittent stomach pains - suspect gas pains from constipation vs ?IBS Abdominal x-ray 7/10 showed moderate stool burden throughout colon, no signs of obstruction. Of note, stools have been mixed, at times hard other times very loose. I will discontinue Bentyl --PRN IV antiemetics   Acute delirium Family have agreed with discharge to skilled nursing facility --Continued Seroquel  25 mg TID + 75 mg at bedtime --PRN Seroquel  also available for uncontrolled agitation/anxiety. Avoid sedating medication   Dementia with behavioral disturbance (HCC) Patient on Seroquel  and as needed Haldol , per psychiatry Patient was seen by psychiatry does not have the capacity to leave AMA.  Seen by psychiatry on 03/13/24    ESRD (end stage renal disease) [facility] Nephrology following.  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   Hyperlipidemia Continue Lipitor   TIA (transient ischemic attack) Ruled out.  MRI of the brain shows chronic encephalomalacia  within the right parietal lobe and advanced diffuse cerebral white matter disease, carotid Doppler showing elevated velocity in the distal right internal carotid suggesting stenosis of 50 to 69%, EEG showing cortical dysfunction arising from the right temporal parietal region secondary to underlying structural abnormality no seizure or left to form discharges, EF normal range.   Hyponatremia Dialysis for volume management     Subjective: Patient seen and examined at bedside this morning He appeared very drowsy and tired Patient will not be able to go to facility today     Physical Exam:  General exam: awake, alert, no acute distress, talkative HEENT: moist mucus membranes, hearing grossly normal  Respiratory system: on room air, normal respiratory effort. Cardiovascular system: normal S1/S2, RRR, no pedal edema.   Gastrointestinal system: soft, NT, ND, no HSM felt, +bowel sounds. Central nervous system: A&O x 2. no gross focal neurologic deficits, normal speech Extremities: moves all , no edema, normal tone Psychiatry: normal mood, congruent affect       Data Reviewed:      Latest Ref Rng & Units 03/27/2024    6:25 AM 03/23/2024    9:43 AM 03/22/2024    6:02 AM  CBC  WBC 4.0 - 10.5 K/uL 13.2  9.9  14.9   Hemoglobin 12.0 - 15.0 g/dL 89.9  9.9  89.9   Hematocrit 36.0 - 46.0 % 30.3  31.3  30.9   Platelets 150 -  400 K/uL 205  187  173        Latest Ref Rng & Units 03/27/2024    6:25 AM 03/25/2024    6:16 AM 03/22/2024    6:02 AM  BMP  Glucose 70 - 99 mg/dL 90  891  895   BUN 8 - 23 mg/dL 98  72  48   Creatinine 0.44 - 1.00 mg/dL 87.62  1.05  2.33   Sodium 135 - 145 mmol/L 130  133  132   Potassium 3.5 - 5.1 mmol/L 4.3  4.1  4.3   Chloride 98 - 111 mmol/L 89  92  92   CO2 22 - 32 mmol/L 23  25  26    Calcium  8.9 - 10.3 mg/dL 8.5  9.1  9.0         Family Communication: Patient seen with no family at bedside today     Disposition: Status is: Inpatient Remains inpatient  appropriate because: needs SNF placement      Planned Discharge Destination: SNF      Vitals:   03/27/24 1130 03/27/24 1150 03/27/24 1218 03/27/24 1301  BP: (!) 93/51 (!) 151/60  135/60  Pulse: 96 68  71  Resp: 14 (!) 29    Temp:  97.6 F (36.4 C)  97.9 F (36.6 C)  TempSrc:  Oral  Oral  SpO2: 97% 96%  98%  Weight:   42.3 kg   Height:          Latest Ref Rng & Units 03/27/2024    6:25 AM 03/23/2024    9:43 AM 03/22/2024    6:02 AM  CBC  WBC 4.0 - 10.5 K/uL 13.2  9.9  14.9   Hemoglobin 12.0 - 15.0 g/dL 89.9  9.9  89.9   Hematocrit 36.0 - 46.0 % 30.3  31.3  30.9   Platelets 150 - 400 K/uL 205  187  173        Latest Ref Rng & Units 03/27/2024    6:25 AM 03/25/2024    6:16 AM 03/22/2024    6:02 AM  BMP  Glucose 70 - 99 mg/dL 90  891  895   BUN 8 - 23 mg/dL 98  72  48   Creatinine 0.44 - 1.00 mg/dL 87.62  1.05  2.33   Sodium 135 - 145 mmol/L 130  133  132   Potassium 3.5 - 5.1 mmol/L 4.3  4.1  4.3   Chloride 98 - 111 mmol/L 89  92  92   CO2 22 - 32 mmol/L 23  25  26    Calcium  8.9 - 10.3 mg/dL 8.5  9.1  9.0      Author: Drue ONEIDA Potter, MD 03/27/2024 3:06 PM  For on call review [email or web address].

## 2024-03-27 NOTE — Progress Notes (Signed)
 [facility]  ROUNDING NOTE   Subjective:  Patient is known to our practice and receives outpatient dialysis treatments at [facility] on a MWF schedule, supervised by Dr. Dennise.   Update   Patient seen and evaluated during dialysis   HEMODIALYSIS FLOWSHEET:  Blood Flow Rate (mL/min): 400 mL/min Arterial Pressure (mmHg): -202.41 mmHg Venous Pressure (mmHg): 215.75 mmHg TMP (mmHg): 8.68 mmHg Ultrafiltration Rate (mL/min): 686 mL/min Dialysate Flow Rate (mL/min): 300 ml/min Dialysis Fluid Bolus: Normal Saline Bolus Amount (mL): 300 mL  Tolerating treatment well  Objective:  Vital signs in last 24 hours:  Temp:  [98 F (36.7 C)-98.6 F (37 C)] 98 F (36.7 C) (07/16 0723) Pulse Rate:  [57-66] 59 (07/16 1000) Resp:  [12-20] 13 (07/16 1000) BP: (99-137)/(47-61) 99/47 (07/16 1000) SpO2:  [94 %-99 %] 96 % (07/16 1000) Weight:  [43.5 kg] 43.5 kg (07/16 0801)  Weight change:  Filed Weights   03/22/24 0835 03/22/24 1320 03/27/24 0801  Weight: 41.4 kg 51.7 kg 43.5 kg    Intake/Output: I/O last 3 completed shifts: In: 344.8 [P.O.:240; I.V.:10; IV Piggyback:94.8] Out: -    Intake/Output this shift:  No intake/output data recorded.  Physical Exam: General: NAD, pleasant  Head: Normocephalic  Eyes: Anicteric  Neck: Supple  Lungs:  Normal effort  Heart: Regular rate and rhythm  Abdomen:  Soft, nontender,   Extremities:  No peripheral edema.  Neurologic: Alert and awake  Skin: warm  Access: Left AVF    Basic Metabolic Panel: Recent Labs  Lab 03/22/24 0602 03/25/24 0616 03/27/24 0625  NA 132* 133* 130*  K 4.3 4.1 4.3  CL 92* 92* 89*  CO2 26 25 23   GLUCOSE 104* 108* 90  BUN 48* 72* 98*  CREATININE 7.66* 8.94* 12.37*  CALCIUM  9.0 9.1 8.5*  PHOS 4.8* 4.9* 6.5*    Liver Function Tests: Recent Labs  Lab 03/22/24 0602 03/25/24 0616 03/25/24 1531 03/27/24 0625  AST  --   --  13*  --   ALT  --   --  9  --   ALKPHOS  --   --  36*   --   BILITOT  --   --  0.5  --   PROT  --   --  6.8  --   ALBUMIN 3.0* 3.4* 3.2* 2.8*   Recent Labs  Lab 03/25/24 1531  LIPASE 30   No results for input(s): AMMONIA in the last 168 hours.  CBC: Recent Labs  Lab 03/20/24 1452 03/22/24 0602 03/23/24 0943 03/27/24 0625  WBC 17.2* 14.9* 9.9 13.2*  NEUTROABS  --   --   --  11.6*  HGB 10.0* 10.0* 9.9* 10.0*  HCT 30.9* 30.9* 31.3* 30.3*  MCV 92.8 94.2 95.1 92.4  PLT 155 173 187 205    Cardiac Enzymes: No results for input(s): CKTOTAL, CKMB, CKMBINDEX, TROPONINI in the last 168 hours.  BNP: Invalid input(s): POCBNP  CBG: Recent Labs  Lab 03/24/24 1333 03/24/24 2137 03/25/24 1220 03/26/24 0748 03/27/24 0724  GLUCAP 106* 102* 107* 86 94    Microbiology: Results for orders placed or performed during the hospital encounter of 03/05/24  Culture, blood (Routine X 2) w Reflex to ID Panel     Status: None   Collection Time: 03/21/24  5:32 PM   Specimen: BLOOD  Result Value Ref Range Status   Specimen Description BLOOD BLOOD RIGHT HAND  Final   Special Requests   Final    BOTTLES DRAWN AEROBIC AND  ANAEROBIC Blood Culture adequate volume   Culture   Final    NO GROWTH 5 DAYS Performed at [facility], [address] [place], [place] [address]    Report Status 03/26/2024 FINAL  Final  Culture, blood (Routine X 2) w Reflex to ID Panel     Status: None   Collection Time: 03/21/24  5:32 PM   Specimen: BLOOD  Result Value Ref Range Status   Specimen Description BLOOD BLOOD RIGHT ARM  Final   Special Requests   Final    BOTTLES DRAWN AEROBIC AND ANAEROBIC Blood Culture adequate volume   Culture   Final    NO GROWTH 5 DAYS Performed at [facility], [address], [place], [place] [address]    Report Status 03/26/2024 FINAL  Final    Coagulation Studies: No results for input(s): LABPROT, INR in the last 72 hours.  Urinalysis: No results for input(s): COLORURINE, LABSPEC,  PHURINE, GLUCOSEU, HGBUR, BILIRUBINUR, KETONESUR, PROTEINUR, UROBILINOGEN, NITRITE, LEUKOCYTESUR in the last 72 hours.  Invalid input(s): APPERANCEUR    Imaging: No results found.   Medications:    promethazine  (PHENERGAN ) injection (IM or IVPB) Stopped (03/25/24 1130)    aspirin   300 mg Rectal Daily   Or   aspirin   325 mg Oral Daily   atorvastatin   40 mg Oral Daily   carvedilol   25 mg Oral BID WC   Chlorhexidine  Gluconate Cloth  6 each Topical Daily   cloNIDine   0.3 mg Transdermal Weekly   diltiazem   180 mg Oral Daily   feeding supplement (NEPRO CARB STEADY)  237 mL Oral TID BM   losartan   100 mg Oral Daily   pantoprazole   40 mg Oral Daily   polyethylene glycol  17 g Oral Daily   QUEtiapine   25 mg Oral TID   QUEtiapine   75 mg Oral Daily   senna-docusate  1 tablet Oral BID   sodium chloride  flush  10-40 mL Intracatheter Q12H   spironolactone   25 mg Oral Daily   traZODone   50 mg Oral QHS   acetaminophen  **OR** acetaminophen  (TYLENOL ) oral liquid 160 mg/5 mL **OR** acetaminophen , bisacodyl , calcium  carbonate, haloperidol  lactate, hydrALAZINE , hydrALAZINE , labetalol , LORazepam , ondansetron  (ZOFRAN ) IV, promethazine  (PHENERGAN ) injection (IM or IVPB), QUEtiapine , sodium chloride  flush, sodium chloride  flush  Assessment/ Plan:  Tracy Jennings is a 68 y.o.  female  with past medical conditions including end-stage renal disease on hemodialysis, dementia, hypertension, diabetes, hyperlipidemia, cervical cancer, recurrent pancreatitis, PVD and end-stage renal disease on hemodialysis.  Patient presents to the emergency department after experiencing slurred speech, left-sided facial spasms and weakness.  Patient has been admitted for TIA (transient ischemic attack) [G45.9] Hallucination [R44.3]   [facility]/MWF/Lt AVG      End-stage renal disease on hemodialysis. Patient receiving dialysis today, UF goal 1.5 to 2 L as tolerated.  Next treatment  scheduled for Friday.  Discharge planning underway.   Transient ischemic attack, reporting symptoms of slurred speech, involuntary left-sided facial spasms and weakness.  Neurology consulted, TIA versus partial complex seizure.  Brain imaging negative for acute infarct.  Carotid ultrasound shows right-sided 50 to 60% stenosis without plaque.  Symptoms resolved.     3.  Hypertension: hypertensive urgency on admission.  - Patient prescribed carvedilol , clonidine , diltiazem , losartan , and spironolactone . Hydralazine , labetalol  for PRN use            - Blood pressure 109/44 during dialysis.   4. Secondary Hyperparathyroidism:             -  continue sevelamer  with meals             - Phosphorus slightly elevated, likely secondary to lack of dialysis on Monday.  Should correct with dialysis today.   5. Anemia with chronic kidney disease: Mircera as outpatient. Holding ESA due to acute ischemic event.              -Hgb 10.0, acceptable.   LOS: 20 Tracy Jennings 7/16/202510:46 AM

## 2024-03-28 DIAGNOSIS — I160 Hypertensive urgency: Secondary | ICD-10-CM | POA: Diagnosis not present

## 2024-03-28 LAB — CBC WITH DIFFERENTIAL/PLATELET
Abs Immature Granulocytes: 0.1 K/uL — ABNORMAL HIGH (ref 0.00–0.07)
Basophils Absolute: 0 K/uL (ref 0.0–0.1)
Basophils Relative: 0 %
Eosinophils Absolute: 0.1 K/uL (ref 0.0–0.5)
Eosinophils Relative: 1 %
HCT: 29 % — ABNORMAL LOW (ref 36.0–46.0)
Hemoglobin: 9.4 g/dL — ABNORMAL LOW (ref 12.0–15.0)
Immature Granulocytes: 1 %
Lymphocytes Relative: 4 %
Lymphs Abs: 0.5 K/uL — ABNORMAL LOW (ref 0.7–4.0)
MCH: 29.8 pg (ref 26.0–34.0)
MCHC: 32.4 g/dL (ref 30.0–36.0)
MCV: 92.1 fL (ref 80.0–100.0)
Monocytes Absolute: 1 K/uL (ref 0.1–1.0)
Monocytes Relative: 9 %
Neutro Abs: 9.7 K/uL — ABNORMAL HIGH (ref 1.7–7.7)
Neutrophils Relative %: 85 %
Platelets: 231 K/uL (ref 150–400)
RBC: 3.15 MIL/uL — ABNORMAL LOW (ref 3.87–5.11)
RDW: 14.6 % (ref 11.5–15.5)
WBC: 11.5 K/uL — ABNORMAL HIGH (ref 4.0–10.5)
nRBC: 0 % (ref 0.0–0.2)

## 2024-03-28 LAB — BASIC METABOLIC PANEL WITH GFR
Anion gap: 11 (ref 5–15)
BUN: 48 mg/dL — ABNORMAL HIGH (ref 8–23)
CO2: 27 mmol/L (ref 22–32)
Calcium: 8.3 mg/dL — ABNORMAL LOW (ref 8.9–10.3)
Chloride: 97 mmol/L — ABNORMAL LOW (ref 98–111)
Creatinine, Ser: 6.9 mg/dL — ABNORMAL HIGH (ref 0.44–1.00)
GFR, Estimated: 6 mL/min — ABNORMAL LOW (ref >60)
Glucose, Bld: 98 mg/dL (ref 70–99)
Potassium: 3.7 mmol/L (ref 3.5–5.1)
Sodium: 135 mmol/L (ref 135–145)

## 2024-03-28 LAB — GLUCOSE, CAPILLARY: Glucose-Capillary: 109 mg/dL — ABNORMAL HIGH (ref 70–99)

## 2024-03-28 MED ORDER — PROSOURCE PLUS PO LIQD
30.0000 mL | Freq: Three times a day (TID) | ORAL | Status: DC
  Administered 2024-03-28 – 2024-04-01 (×5): 30 mL via ORAL

## 2024-03-28 MED ORDER — BOOST / RESOURCE BREEZE PO LIQD CUSTOM
1.0000 | Freq: Three times a day (TID) | ORAL | Status: DC
  Administered 2024-03-29 – 2024-04-01 (×4): 1 via ORAL

## 2024-03-28 MED ORDER — RENA-VITE PO TABS
1.0000 | ORAL_TABLET | Freq: Every day | ORAL | Status: DC
  Administered 2024-03-28 – 2024-04-01 (×3): 1 via ORAL
  Filled 2024-03-28 (×7): qty 1

## 2024-03-28 NOTE — Progress Notes (Signed)
 Initial Nutrition Assessment  DOCUMENTATION CODES:   Underweight, Severe malnutrition in context of chronic illness  INTERVENTION:   -Liberalize diet to regular for wider variety of meal selections -Renal MVI daily -Feeding assistance with meals -Boost Breeze po TID, each supplement provides 250 kcal and 9 grams of protein  -30 ml Prosource Plus TID, each supplement provides 100 kcals and 15 grams protein -D/c Nepro Shake po BID, each supplement provides 425 kcal and 19 grams protein   NUTRITION DIAGNOSIS:   Severe Malnutrition related to chronic illness (dementia, ESRD on HD) as evidenced by moderate fat depletion, severe fat depletion, moderate muscle depletion, severe muscle depletion, percent weight loss.  GOAL:   Patient will meet greater than or equal to 90% of their needs  MONITOR:   PO intake, Supplement acceptance  REASON FOR ASSESSMENT:   Consult Assessment of nutrition requirement/status, Poor PO  ASSESSMENT:      Pt admitted with TIA  6/26- s/p EEG 7/16- sa/p BSE- regular with thin liquids Reviewed I/O's: -1.4 L x 24 hours and -6 L since 03/14/24  Reviewed I/O's: -1.4 L x 24 hours and -6 L since 03/14/24  Spoke with pt at bedside, who was pleasant and in good spirits today. Pt smiling and eager to engage RD in conversation. Pt requested water , which RD provided, and consumed without difficulty. Pt shares that her appetite has improved today, but she tends to pick at foods and generally consumes bites and sips at meals. She endorses abdominal pain and feeling like food runs right through me, especially heavier foods. Meal completions 0-50%.   Pt unsure of UBW of EDW, but reports she has lost about 23# in the past year. When asked why she thinks she lost weight, pt reports that she is unsure. Reviewed wt hx; pt has experienced a 33.4% wt loss over the past 6 months, which is significant for time frame.   Discussed importance of good meal and supplement intake  to promote healing. Pt does not like Nepro shakes, but states it runs right through me. Reviewed other options on formulary; pt would like to try Boost Breeze.   Case discussed with SLP, who reports pt that pt's mentation waxes and wanes and will sometimes hold liquids in her mouth. RD did not observe this on visit. Pt more attentive with meals assistance. SLP reports that pt would benefit from meal assistance and engagement with meals.   Per TOC notes, plan for SNF vs home health at discharge.   Medications reviewed and include cardizem , protonix , miralax , and aldactone .   Lab Results  Component Value Date   HGBA1C 5.1 03/05/2024   PTA DM medications are none.   Labs reviewed: CBGS: 86-109 (inpatient orders for glycemic control are none).    NUTRITION - FOCUSED PHYSICAL EXAM:  Flowsheet Row Most Recent Value  Orbital Region Moderate depletion  Upper Arm Region Severe depletion  Thoracic and Lumbar Region Moderate depletion  Buccal Region Severe depletion  Temple Region Severe depletion  Clavicle Bone Region Moderate depletion  Clavicle and Acromion Bone Region Moderate depletion  Scapular Bone Region Moderate depletion  Dorsal Hand Severe depletion  Patellar Region Severe depletion  Anterior Thigh Region Severe depletion  Posterior Calf Region Severe depletion  Edema (RD Assessment) Mild  Hair Reviewed  Eyes Reviewed  Mouth Reviewed  Skin Reviewed  Nails Reviewed    Diet Order:   Diet Order             Diet regular Fluid consistency:  Thin  Diet effective now                   EDUCATION NEEDS:   Education needs have been addressed  Skin:  Skin Assessment: Skin Integrity Issues: Skin Integrity Issues:: Other (Comment) Other: wound to lt anterior toe  Last BM:  03/26/24 (type 7)  Height:   Ht Readings from Last 1 Encounters:  03/06/24 5' 3 (1.6 m)    Weight:   Wt Readings from Last 1 Encounters:  03/27/24 42.3 kg    Ideal Body Weight:  52.3  kg  BMI:  Body mass index is 16.52 kg/m.  Estimated Nutritional Needs:   Kcal:  1650-1850  Protein:  85-100 grams  Fluid:  1000 ml + UOP    Margery ORN, RD, LDN, CDCES Registered Dietitian III Certified Diabetes Care and Education Specialist If unable to reach this RD, please use RD Inpatient group chat on secure chat between hours of 8am-4 pm daily

## 2024-03-28 NOTE — Progress Notes (Addendum)
 Speech Language Pathology Treatment: Dysphagia  Patient Details Name: Tracy Jennings MRN: [id number] DOB: [birth year] Today's Date: 03/28/2024 Time: 9171-9161 SLP Time Calculation (min) (ACUTE ONLY): 10 min  Assessment / Plan / Recommendation Clinical Impression  Pt seen for diet tolerance. Discussed pt with RD. RD observed pt taking sips of thin liquids without difficulty this AM. RD agreed that a more liberal diet may support increased PO intake given poor intake. Pt with prolonged mastication of solid which is likely due to dental status. Adequate oral clearance. No s/sx oral holding this date. Pharyngeal swallow continues to appear [facility] per clinical assessment. Set up and cues to feed self needed due to AMS, cognitive status. Recommend continuation of a regular diet, thin liquids with safe swallowing strategies/aspiration precautions as outlined below. SLP to sign off as pt has no acute SLP needs at this time.    HPI HPI: Pt is a 67 year old female presents with slurred speech,  left facial spasm, left sided weakness.    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      SLP Plan  All goals met (D/C from ST in acute)          Recommendations  Diet recommendations: Regular;Thin liquid Medication Administration: Crushed with puree (prefers vanilla yogurt) Supervision: Full supervision/cueing for compensatory strategies;Patient able to self feed Compensations: Minimize environmental distractions;Slow rate;Small sips/bites                  Oral care BID;Staff/trained caregiver to provide oral care   Frequent or constant Supervision/Assistance Dysphagia, oral phase (R13.11)     All goals met (D/C from ST in acute)    Tracy Jennings, M.S., CCC-SLP Speech-Language Pathologist [facility] [phone]  (ASCOM)  Tracy Jennings  03/28/2024, 9:20 AM

## 2024-03-28 NOTE — Progress Notes (Signed)
 [facility]  ROUNDING NOTE   Subjective:  Patient is known to our practice and receives outpatient dialysis treatments at [facility] on a MWF schedule, supervised by Dr. Dennise.   Update   Patient seen resting in bed Breakfast tray at bedside untouched Patient easily aroused, alert and oriented to self No complaints to offer   Objective:  Vital signs in last 24 hours:  Temp:  [97.6 F (36.4 C)-98.6 F (37 C)] 98 F (36.7 C) (07/17 0716) Pulse Rate:  [63-96] 65 (07/17 1025) Resp:  [14-29] 18 (07/17 0716) BP: (93-154)/(42-64) 146/64 (07/17 1025) SpO2:  [93 %-100 %] 97 % (07/17 1025) Weight:  [42.3 kg] 42.3 kg (07/16 1218)  Weight change:  Filed Weights   03/22/24 1320 03/27/24 0801 03/27/24 1218  Weight: 51.7 kg 43.5 kg 42.3 kg    Intake/Output: I/O last 3 completed shifts: In: 20 [I.V.:20] Out: 1400 [Other:1400]   Intake/Output this shift:  Total I/O In: 240 [P.O.:240] Out: -   Physical Exam: General: NAD, pleasant  Head: Normocephalic  Eyes: Anicteric  Neck: Supple  Lungs:  Normal effort  Heart: Regular rate and rhythm  Abdomen:  Soft, nontender,   Extremities:  No peripheral edema.  Neurologic: Alert and awake  Skin: warm  Access: Left AVF    Basic Metabolic Panel: Recent Labs  Lab 03/22/24 0602 03/25/24 0616 03/27/24 0625 03/28/24 0425  NA 132* 133* 130* 135  K 4.3 4.1 4.3 3.7  CL 92* 92* 89* 97*  CO2 26 25 23 27   GLUCOSE 104* 108* 90 98  BUN 48* 72* 98* 48*  CREATININE 7.66* 8.94* 12.37* 6.90*  CALCIUM  9.0 9.1 8.5* 8.3*  PHOS 4.8* 4.9* 6.5*  --     Liver Function Tests: Recent Labs  Lab 03/22/24 0602 03/25/24 0616 03/25/24 1531 03/27/24 0625  AST  --   --  13*  --   ALT  --   --  9  --   ALKPHOS  --   --  36*  --   BILITOT  --   --  0.5  --   PROT  --   --  6.8  --   ALBUMIN 3.0* 3.4* 3.2* 2.8*   Recent Labs  Lab 03/25/24 1531  LIPASE 30   No results for input(s): AMMONIA in the last 168  hours.  CBC: Recent Labs  Lab 03/22/24 0602 03/23/24 0943 03/27/24 0625 03/28/24 0425  WBC 14.9* 9.9 13.2* 11.5*  NEUTROABS  --   --  11.6* 9.7*  HGB 10.0* 9.9* 10.0* 9.4*  HCT 30.9* 31.3* 30.3* 29.0*  MCV 94.2 95.1 92.4 92.1  PLT 173 187 205 231    Cardiac Enzymes: No results for input(s): CKTOTAL, CKMB, CKMBINDEX, TROPONINI in the last 168 hours.  BNP: Invalid input(s): POCBNP  CBG: Recent Labs  Lab 03/24/24 2137 03/25/24 1220 03/26/24 0748 03/27/24 0724 03/28/24 0717  GLUCAP 102* 107* 86 94 109*    Microbiology: Results for orders placed or performed during the hospital encounter of 03/05/24  Culture, blood (Routine X 2) w Reflex to ID Panel     Status: None   Collection Time: 03/21/24  5:32 PM   Specimen: BLOOD  Result Value Ref Range Status   Specimen Description BLOOD BLOOD RIGHT HAND  Final   Special Requests   Final    BOTTLES DRAWN AEROBIC AND ANAEROBIC Blood Culture adequate volume   Culture   Final    NO GROWTH 5 DAYS Performed at [facility], [address] [place], [place] [address]    Report Status 03/26/2024 FINAL  Final  Culture, blood (Routine X 2) w Reflex to ID Panel     Status: None   Collection Time: 03/21/24  5:32 PM   Specimen: BLOOD  Result Value Ref Range Status   Specimen Description BLOOD BLOOD RIGHT ARM  Final   Special Requests   Final    BOTTLES DRAWN AEROBIC AND ANAEROBIC Blood Culture adequate volume   Culture   Final    NO GROWTH 5 DAYS Performed at [facility], [address], [place], [place] [address]    Report Status 03/26/2024 FINAL  Final    Coagulation Studies: No results for input(s): LABPROT, INR in the last 72 hours.  Urinalysis: No results for input(s): COLORURINE, LABSPEC, PHURINE, GLUCOSEU, HGBUR, BILIRUBINUR, KETONESUR, PROTEINUR, UROBILINOGEN, NITRITE, LEUKOCYTESUR in the last 72 hours.  Invalid input(s): APPERANCEUR    Imaging: No  results found.   Medications:    promethazine  (PHENERGAN ) injection (IM or IVPB) Stopped (03/25/24 1130)    (feeding supplement) PROSource Plus  30 mL Oral TID BM   aspirin   300 mg Rectal Daily   Or   aspirin   325 mg Oral Daily   atorvastatin   40 mg Oral Daily   carvedilol   25 mg Oral BID WC   Chlorhexidine  Gluconate Cloth  6 each Topical Daily   cloNIDine   0.3 mg Transdermal Weekly   diltiazem   180 mg Oral Daily   feeding supplement  1 Container Oral TID BM   losartan   100 mg Oral Daily   multivitamin  1 tablet Oral QHS   pantoprazole   40 mg Oral Daily   polyethylene glycol  17 g Oral Daily   QUEtiapine   25 mg Oral TID   QUEtiapine   75 mg Oral Daily   senna-docusate  1 tablet Oral BID   sodium chloride  flush  10-40 mL Intracatheter Q12H   spironolactone   25 mg Oral Daily   traZODone   50 mg Oral QHS   acetaminophen  **OR** acetaminophen  (TYLENOL ) oral liquid 160 mg/5 mL **OR** acetaminophen , bisacodyl , calcium  carbonate, haloperidol  lactate, hydrALAZINE , hydrALAZINE , labetalol , LORazepam , ondansetron  (ZOFRAN ) IV, promethazine  (PHENERGAN ) injection (IM or IVPB), QUEtiapine , sodium chloride  flush, sodium chloride  flush  Assessment/ Plan:  Ms. ROKIA BOSKET is a 68 y.o.  female  with past medical conditions including end-stage renal disease on hemodialysis, dementia, hypertension, diabetes, hyperlipidemia, cervical cancer, recurrent pancreatitis, PVD and end-stage renal disease on hemodialysis.  Patient presents to the emergency department after experiencing slurred speech, left-sided facial spasms and weakness.  Patient has been admitted for TIA (transient ischemic attack) [G45.9] Hallucination [R44.3]   [facility]/MWF/Lt AVG      End-stage renal disease on hemodialysis. Patient received dialysis yesterday, UF 1.4 L achieved.  Next treatment scheduled for Friday.  Discharge planning to now include rehab placement.   Transient ischemic attack, reporting symptoms of  slurred speech, involuntary left-sided facial spasms and weakness.  Neurology consulted, TIA versus partial complex seizure.  Brain imaging negative for acute infarct.  Carotid ultrasound shows right-sided 50 to 60% stenosis without plaque.  Symptoms resolved.     3.  Hypertension: hypertensive urgency on admission.  - Patient prescribed carvedilol , clonidine , diltiazem , losartan , and spironolactone . Hydralazine , labetalol  for PRN use            - Blood pressure 146/64, acceptable for this patient.   4. Secondary Hyperparathyroidism:             -  continue sevelamer  with meals             - Calcium  acceptable however phosphorus elevated, likely due to missed dialysis treatment.  Will monitor with a.m. labs.   5. Anemia with chronic kidney disease: Mircera as outpatient. Holding ESA due to acute ischemic event.              -Hgb 9.4.  Will consider low-dose EPO with dialysis.   LOS: 21 Oddie Bottger 7/17/202511:10 AM

## 2024-03-28 NOTE — Plan of Care (Signed)
  Problem: Nutrition: Goal: Risk of aspiration will decrease Outcome: Progressing   Problem: Activity: Goal: Risk for activity intolerance will decrease Outcome: Progressing   Problem: Nutrition: Goal: Adequate nutrition will be maintained Outcome: Progressing   Problem: Coping: Goal: Level of anxiety will decrease Outcome: Progressing   Problem: Elimination: Goal: Will not experience complications related to bowel motility Outcome: Progressing   Problem: Safety: Goal: Ability to remain free from injury will improve Outcome: Progressing   Problem: Skin Integrity: Goal: Risk for impaired skin integrity will decrease Outcome: Progressing

## 2024-03-28 NOTE — Plan of Care (Signed)
  Problem: Education: Goal: Knowledge of disease or condition will improve Outcome: Progressing   Problem: Coping: Goal: Will verbalize positive feelings about self Outcome: Progressing   Problem: Self-Care: Goal: Ability to participate in self-care as condition permits will improve Outcome: Progressing   Problem: Nutrition: Goal: Risk of aspiration will decrease Outcome: Progressing   Problem: Activity: Goal: Risk for activity intolerance will decrease Outcome: Progressing   Problem: Nutrition: Goal: Adequate nutrition will be maintained Outcome: Progressing   Problem: Coping: Goal: Level of anxiety will decrease Outcome: Progressing   Problem: Elimination: Goal: Will not experience complications related to bowel motility Outcome: Progressing   Problem: Safety: Goal: Ability to remain free from injury will improve Outcome: Progressing   Problem: Skin Integrity: Goal: Risk for impaired skin integrity will decrease Outcome: Progressing

## 2024-03-28 NOTE — Progress Notes (Signed)
 Progress Note   Patient: Tracy Jennings FMW:[id number] DOB: 1956/01/21 DOA: 03/05/2024     21 DOS: the patient was seen and examined on 03/28/2024      Brief hospital course:   Tracy Jennings is a 68 year old female with ESRD on HD MWF, HTN, HLD, DM, PVD, dementia, chronic recurrent pancreatitis, stroke, cervical cancer, HIT, who presents with slurred speech, left facial spasm, left-sided weakness.  Patient had syncopal episode on 6/23 and was seen in the ED.  At that time she was found to have O2 desaturation to 80% due to pulmonary edema on chest x-ray as well as hyperkalemia with a potassium of 6.2.  She had negative head CT during that admission.  She received hemodialysis and then was discharged home.  Her last known normal was 6/24 at 1700.  Upon arrival to the ED patient's confusion had resolved entirely.  Labs in the ED mostly unremarkable, potassium 4.6, creatinine consistent with ESRD.  Blood pressure was found to be 252/79.  Patient was placed in telemetry bed for observation.  Code stroke was called.Blood pressure resolved.  Head CT was without acute intracranial abnormality.  Chronic infarct seen on right parietal lobe and extensive chronic microvascular ischemic changes with remote lacunar infarcts in the right caudate and right thalamus appreciated.  Patient was admitted for workup of presumed TIA.  LDL 96, hemoglobin A1c 5.1%.  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  MRI brain revealed chronic encephalomalacia within the right parietal lobe and advanced diffuse cerebral white matter disease.  Patient underwent carotid Dopplers which revealed elevated velocity within the distal right internal carotid artery suggesting stenosis of 50 to 69% though no significant plaque identified and thus this may be artifactual. Patient underwent hemodialysis on 6/25 but left her session early AMA.she attempted to leave the hospital AMA but during this event began  hallucinating and loss capacity.  Psychiatry was consulted and met with the patient on 6/26 and confirms acute delirium, currently the patient does not have capacity to make medical decisions.  She has been started on Seroquel  for presumed Lewy body dementia.  She has had persistent agitation and Seroquel  has been slowly increased. She is currently medically stable and undergoing routine HD while awaiting safe dispo. TOC is consulted.    BP is labile and high.  Family voiced concern about isosorbide  mononitrate.  Nephrology discontinued isosorbide  and possibly add some other medications.  Patient is currently on losartan , Coreg , clonidine , diltiazem , doxazosin  and the spironolactone .   7/6.  Case discussed with nephrology and switching clonidine  pills over to clonidine  patch.  Imdur  discontinued the day prior.  Family interested in taking patient home rather than going to rehab 7/7.  Patient seen on dialysis and blood pressure on the lower side.  Continue to monitor closely.  Patient complains of pains and was given Tylenol . 7/8.  Patient did not sleep last night.  She was able to answer questions and follow commands for me this morning.  Was sleeping during the day.     7/9 - Pt had dialysis. Otherwise uneventful day. 7/10 - new onset nausea/vomiting, leukocytosis, no tolerating PO meds, BP's uncontrolled 7/11 - BP's still significantly elevated. Pt vomited her PO meds yesterday AM.  7/12 - BP's improved with systolic's in 140's to 160's.  No N/V reported.  +Visual hallucinations, but no agitation issues reported since I assumed care. 7/13 -- BP's improved.  No N/V or complaints of stomach issues.   7/14 --  pt refused AM dialysis and PO meds, recurrent stomach pains but no N/V. Normal lipase 7/15 -- no acute issues, ongoing abdominal pain from constipation/gas pains 7/16-7/17-patient having hallucination and psychiatry eval requested     Assessment and Plan:   Hypertensive urgency BP's  remain overall elevated, at times still up at 190's-200's systolic.  Improved with increased dose of Cardizem  7/11.  BP's labile in dialysis. Continue clonidine  patch, Coreg , Cardizem  CD, losartan , spironolactone  --Increased Cardizem  from 120 >> 180 mg --Nephrology dc'd doxazosin  -- apparently pt has had issues in the past with this medication in outpatient setting --Titrate regimen as needed   Leukocytosis - Resolved.  Monitor CBC Blood culture showing no growth to date   Nausea and Vomiting-resolved Intermittent stomach pains -  Continue simethicone  Continue anticonstipation medication   Acute delirium Family have agreed with discharge to skilled nursing facility --Continued Seroquel  25 mg TID + 75 mg at bedtime --PRN Seroquel  also available for uncontrolled agitation/anxiety. Avoid sedating medication Continue delirium precaution   Dementia with behavioral disturbance (HCC) Patient on Seroquel  and as needed Haldol , per psychiatry Patient was seen by psychiatry does not have the capacity to leave AMA.  Seen by psychiatry on 03/13/24  I have reached out again to psychiatry for reevaluation given persistent hallucination   ESRD (end stage renal disease) [facility]) Nephrology following.  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   Hyperlipidemia Continue Lipitor   TIA (transient ischemic attack) Ruled out.  MRI of the brain shows chronic encephalomalacia within the right parietal lobe and advanced diffuse cerebral white matter disease, carotid Doppler showing elevated velocity in the distal right internal carotid suggesting stenosis of 50 to 69%, EEG showing cortical dysfunction arising from the right temporal parietal region secondary to underlying structural abnormality no seizure or left to form discharges, EF normal range.    Hyponatremia Dialysis for volume management     Subjective: Patient seen and examined at bedside this morning Patient did have some hallucination this afternoon Still not at her baseline     Physical Exam:   General exam: awake, alert, no acute distress, talkative HEENT: moist mucus membranes, hearing grossly normal  Respiratory system: on room air, normal respiratory effort. Cardiovascular system: normal S1/S2, RRR, no pedal edema.   Gastrointestinal system: soft, NT, ND, no HSM felt, +bowel sounds. Central nervous system: A&O x 2. no gross focal neurologic deficits, normal speech Extremities: moves all , no edema, normal tone Psychiatry: normal mood, congruent affect    Family Communication: Patient seen with no family at bedside today     Disposition: Status is: Inpatient Remains inpatient appropriate because: needs SNF placement      Planned Discharge Destination: SNF      Data Reviewed:    Latest Ref Rng & Units 03/28/2024    4:25 AM 03/27/2024    6:25 AM 03/23/2024    9:43 AM  CBC  WBC 4.0 - 10.5 K/uL 11.5  13.2  9.9   Hemoglobin 12.0 - 15.0 g/dL 9.4  89.9  9.9   Hematocrit 36.0 - 46.0 % 29.0  30.3  31.3   Platelets 150 - 400 K/uL 231  205  187        Latest Ref Rng & Units 03/28/2024    4:25 AM 03/27/2024    6:25 AM 03/25/2024    6:16 AM  BMP  Glucose 70 - 99 mg/dL 98  90  891   BUN 8 - 23 mg/dL 48  98  72   Creatinine 0.44 - 1.00 mg/dL 3.09  87.62  1.05   Sodium 135 - 145 mmol/L 135  130  133   Potassium 3.5 - 5.1 mmol/L 3.7  4.3  4.1   Chloride 98 - 111 mmol/L 97  89  92   CO2 22 - 32 mmol/L 27  23  25    Calcium  8.9 - 10.3 mg/dL 8.3  8.5  9.1      Vitals:   03/28/24 0415 03/28/24 0716 03/28/24 1025 03/28/24 1536  BP: (!) 125/50 (!) 124/42 (!) 146/64 (!) 138/94  Pulse: 63  65 64  Resp: 20 18  18   Temp: 97.8 F (36.6 C) 98 F (36.7 C)    TempSrc:      SpO2: 93%  97% 96%  Weight:      Height:        \   Author: Drue ONEIDA Potter,  MD 03/28/2024 3:48 PM  For on call review [email or web address].

## 2024-03-29 DIAGNOSIS — Z5181 Encounter for therapeutic drug level monitoring: Secondary | ICD-10-CM

## 2024-03-29 DIAGNOSIS — I160 Hypertensive urgency: Secondary | ICD-10-CM | POA: Diagnosis not present

## 2024-03-29 LAB — CBC WITH DIFFERENTIAL/PLATELET
Abs Immature Granulocytes: 0.09 K/uL — ABNORMAL HIGH (ref 0.00–0.07)
Basophils Absolute: 0 K/uL (ref 0.0–0.1)
Basophils Relative: 0 %
Eosinophils Absolute: 0.2 K/uL (ref 0.0–0.5)
Eosinophils Relative: 2 %
HCT: 29.2 % — ABNORMAL LOW (ref 36.0–46.0)
Hemoglobin: 9.3 g/dL — ABNORMAL LOW (ref 12.0–15.0)
Immature Granulocytes: 1 %
Lymphocytes Relative: 4 %
Lymphs Abs: 0.6 K/uL — ABNORMAL LOW (ref 0.7–4.0)
MCH: 29.9 pg (ref 26.0–34.0)
MCHC: 31.8 g/dL (ref 30.0–36.0)
MCV: 93.9 fL (ref 80.0–100.0)
Monocytes Absolute: 0.8 K/uL (ref 0.1–1.0)
Monocytes Relative: 6 %
Neutro Abs: 11.2 K/uL — ABNORMAL HIGH (ref 1.7–7.7)
Neutrophils Relative %: 87 %
Platelets: 265 K/uL (ref 150–400)
RBC: 3.11 MIL/uL — ABNORMAL LOW (ref 3.87–5.11)
RDW: 14.9 % (ref 11.5–15.5)
WBC: 12.8 K/uL — ABNORMAL HIGH (ref 4.0–10.5)
nRBC: 0 % (ref 0.0–0.2)

## 2024-03-29 LAB — BASIC METABOLIC PANEL WITH GFR
Anion gap: 15 (ref 5–15)
BUN: 60 mg/dL — ABNORMAL HIGH (ref 8–23)
CO2: 24 mmol/L (ref 22–32)
Calcium: 8.3 mg/dL — ABNORMAL LOW (ref 8.9–10.3)
Chloride: 98 mmol/L (ref 98–111)
Creatinine, Ser: 8.46 mg/dL — ABNORMAL HIGH (ref 0.44–1.00)
GFR, Estimated: 5 mL/min — ABNORMAL LOW (ref >60)
Glucose, Bld: 170 mg/dL — ABNORMAL HIGH (ref 70–99)
Potassium: 4 mmol/L (ref 3.5–5.1)
Sodium: 137 mmol/L (ref 135–145)

## 2024-03-29 LAB — PHOSPHORUS: Phosphorus: 3.4 mg/dL (ref 2.5–4.6)

## 2024-03-29 LAB — GLUCOSE, CAPILLARY
Glucose-Capillary: 175 mg/dL — ABNORMAL HIGH (ref 70–99)
Glucose-Capillary: 126 mg/dL — ABNORMAL HIGH (ref 70–99)

## 2024-03-29 MED ORDER — CARVEDILOL 6.25 MG PO TABS
12.5000 mg | ORAL_TABLET | Freq: Two times a day (BID) | ORAL | Status: DC
  Administered 2024-03-30 – 2024-04-02 (×6): 12.5 mg via ORAL
  Filled 2024-03-29 (×7): qty 2

## 2024-03-29 MED ORDER — QUETIAPINE FUMARATE 25 MG PO TABS
100.0000 mg | ORAL_TABLET | ORAL | Status: DC
  Administered 2024-03-30 – 2024-04-01 (×2): 100 mg via ORAL
  Filled 2024-03-29 (×3): qty 4

## 2024-03-29 NOTE — Progress Notes (Signed)
 Rapid Response Event Note   Reason for Call : Bradycardic episode   Initial Focused Assessment: Patient in bed and arousing to questions, per HD nurse and Faith Druscilla PIETY  patient at baseline. Dr. Dorinda at bedside. Patient on cardiac monitor with HR 60-65, BP 142/54      Interventions: Patient being assessed by Dr. Dorinda. CBG obtained and within normal limits.    Plan of Care: Patient to continue HD session and return to assigned room.     Event Summary: Patient hemodynamically stable   MD Notified: 10:10 Call Time:10:10 Arrival Time:10:13 End Time:10:18  Vernell Verla Horseman, RN

## 2024-03-29 NOTE — Progress Notes (Signed)
 OT Cancellation Note  Patient Details Name: Tracy Jennings MRN: [id number] DOB: [birth year]   Cancelled Treatment:    Reason Eval/Treat Not Completed: Other (comment) (pt is OTF for HD; OT will follow up as able.ALLIE Therisa Sheffield, OTD OTR/L  03/29/24, 8:47 AM

## 2024-03-29 NOTE — Progress Notes (Signed)
Pt left unit for dialysis

## 2024-03-29 NOTE — Progress Notes (Signed)
 Pt bp 98/48 at 1000. Pt blood pressure retaken at 10:04 and read 102/52. Pt bradycardia on monitor in 30's at 10:07. This nurse went to assess patient and pt did not initially respond, pt given sternal rubs, pt noted to have pulse and breathing, pt continued not to respond and RR called. Pt UF turned off and pt given 2 x 100ns bolus. Pt blood pressure retaken and bp 129/91. Pt responded after a second round of sternal rubs. Pt glucose 126. RR and MD at bedside, see orders. Pt alert. Pt continues on dialysis without UF. Will continue to monitor.

## 2024-03-29 NOTE — Progress Notes (Signed)
 [facility]  ROUNDING NOTE   Subjective:  Patient is known to our practice and receives outpatient dialysis treatments at [facility] on a MWF schedule, supervised by Dr. Dennise.   Update   Patient seen and evaluated during dialysis   HEMODIALYSIS FLOWSHEET:  Blood Flow Rate (mL/min): 400 mL/min Arterial Pressure (mmHg): -195.55 mmHg Venous Pressure (mmHg): 229.89 mmHg TMP (mmHg): 1.21 mmHg Ultrafiltration Rate (mL/min): 0 mL/min Dialysate Flow Rate (mL/min): 299 ml/min Dialysis Fluid Bolus: Normal Saline Bolus Amount (mL): 100 mL  Resting comfortably during dialysis  RRT called due to bradycardia and unresponsiveness.   Objective:  Vital signs in last 24 hours:  Temp:  [98 F (36.7 C)-98.6 F (37 C)] 98 F (36.7 C) (07/18 0746) Pulse Rate:  [58-67] 66 (07/18 1031) Resp:  [8-23] 15 (07/18 1100) BP: (91-147)/(40-94) 127/42 (07/18 1100) SpO2:  [96 %-100 %] 99 % (07/18 1015) Weight:  [40.1 kg] 40.1 kg (07/18 0746)  Weight change:  Filed Weights   03/27/24 0801 03/27/24 1218 03/29/24 0746  Weight: 43.5 kg 42.3 kg 40.1 kg    Intake/Output: I/O last 3 completed shifts: In: 250 [P.O.:240; I.V.:10] Out: -    Intake/Output this shift:  No intake/output data recorded.  Physical Exam: General: NAD  Head: Normocephalic  Eyes: Anicteric  Neck: Supple  Lungs:  Normal effort  Heart: Regular rate and rhythm  Abdomen:  Soft, nontender,   Extremities:  No peripheral edema.  Neurologic: Alert and awake  Skin: warm  Access: Left AVF    Basic Metabolic Panel: Recent Labs  Lab 03/25/24 0616 03/27/24 0625 03/28/24 0425 03/29/24 0520  NA 133* 130* 135 137  K 4.1 4.3 3.7 4.0  CL 92* 89* 97* 98  CO2 25 23 27 24   GLUCOSE 108* 90 98 170*  BUN 72* 98* 48* 60*  CREATININE 8.94* 12.37* 6.90* 8.46*  CALCIUM  9.1 8.5* 8.3* 8.3*  PHOS 4.9* 6.5*  --   --     Liver Function Tests: Recent Labs  Lab 03/25/24 0616 03/25/24 1531 03/27/24 0625   AST  --  13*  --   ALT  --  9  --   ALKPHOS  --  36*  --   BILITOT  --  0.5  --   PROT  --  6.8  --   ALBUMIN 3.4* 3.2* 2.8*   Recent Labs  Lab 03/25/24 1531  LIPASE 30   No results for input(s): AMMONIA in the last 168 hours.  CBC: Recent Labs  Lab 03/23/24 0943 03/27/24 0625 03/28/24 0425 03/29/24 0520  WBC 9.9 13.2* 11.5* 12.8*  NEUTROABS  --  11.6* 9.7* 11.2*  HGB 9.9* 10.0* 9.4* 9.3*  HCT 31.3* 30.3* 29.0* 29.2*  MCV 95.1 92.4 92.1 93.9  PLT 187 205 231 265    Cardiac Enzymes: No results for input(s): CKTOTAL, CKMB, CKMBINDEX, TROPONINI in the last 168 hours.  BNP: Invalid input(s): POCBNP  CBG: Recent Labs  Lab 03/25/24 1220 03/26/24 0748 03/27/24 0724 03/28/24 0717 03/29/24 0734  GLUCAP 107* 86 94 109* 175*    Microbiology: Results for orders placed or performed during the hospital encounter of 03/05/24  Culture, blood (Routine X 2) w Reflex to ID Panel     Status: None   Collection Time: 03/21/24  5:32 PM   Specimen: BLOOD  Result Value Ref Range Status   Specimen Description BLOOD BLOOD RIGHT HAND  Final   Special Requests   Final    BOTTLES DRAWN AEROBIC AND  ANAEROBIC Blood Culture adequate volume   Culture   Final    NO GROWTH 5 DAYS Performed at [facility], [address] [place], [place] [address]    Report Status 03/26/2024 FINAL  Final  Culture, blood (Routine X 2) w Reflex to ID Panel     Status: None   Collection Time: 03/21/24  5:32 PM   Specimen: BLOOD  Result Value Ref Range Status   Specimen Description BLOOD BLOOD RIGHT ARM  Final   Special Requests   Final    BOTTLES DRAWN AEROBIC AND ANAEROBIC Blood Culture adequate volume   Culture   Final    NO GROWTH 5 DAYS Performed at [facility], [address], [place], [place] [address]    Report Status 03/26/2024 FINAL  Final    Coagulation Studies: No results for input(s): LABPROT, INR in the last 72 hours.  Urinalysis: No results  for input(s): COLORURINE, LABSPEC, PHURINE, GLUCOSEU, HGBUR, BILIRUBINUR, KETONESUR, PROTEINUR, UROBILINOGEN, NITRITE, LEUKOCYTESUR in the last 72 hours.  Invalid input(s): APPERANCEUR    Imaging: No results found.   Medications:    promethazine  (PHENERGAN ) injection (IM or IVPB) Stopped (03/25/24 1130)    (feeding supplement) PROSource Plus  30 mL Oral TID BM   aspirin   300 mg Rectal Daily   Or   aspirin   325 mg Oral Daily   atorvastatin   40 mg Oral Daily   carvedilol   25 mg Oral BID WC   Chlorhexidine  Gluconate Cloth  6 each Topical Daily   cloNIDine   0.3 mg Transdermal Weekly   diltiazem   180 mg Oral Daily   feeding supplement  1 Container Oral TID BM   losartan   100 mg Oral Daily   multivitamin  1 tablet Oral QHS   pantoprazole   40 mg Oral Daily   polyethylene glycol  17 g Oral Daily   QUEtiapine   25 mg Oral TID   QUEtiapine   75 mg Oral Daily   senna-docusate  1 tablet Oral BID   sodium chloride  flush  10-40 mL Intracatheter Q12H   spironolactone   25 mg Oral Daily   traZODone   50 mg Oral QHS   acetaminophen  **OR** acetaminophen  (TYLENOL ) oral liquid 160 mg/5 mL **OR** acetaminophen , bisacodyl , calcium  carbonate, haloperidol  lactate, hydrALAZINE , hydrALAZINE , labetalol , LORazepam , ondansetron  (ZOFRAN ) IV, promethazine  (PHENERGAN ) injection (IM or IVPB), QUEtiapine , sodium chloride  flush, sodium chloride  flush  Assessment/ Plan:  Tracy Jennings is a 68 y.o.  female  with past medical conditions including end-stage renal disease on hemodialysis, dementia, hypertension, diabetes, hyperlipidemia, cervical cancer, recurrent pancreatitis, PVD and end-stage renal disease on hemodialysis.  Patient presents to the emergency department after experiencing slurred speech, left-sided facial spasms and weakness.  Patient has been admitted for TIA (transient ischemic attack) [G45.9] Hallucination [R44.3]   [facility] [place]/MWF/Lt AVG      End-stage  renal disease on hemodialysis. Receiving dialysis with UF 1L as tolerated. HD RN reports bradycardia to 30s and patient unresponsive.  Rapid response called.  UF held, patient received normal saline bolus.  Heart rate recovered to 60s.  Patient more alert.  Will hold UF for remainder of treatment.  Next treatment scheduled for Monday.   Transient ischemic attack, reporting symptoms of slurred speech, involuntary left-sided facial spasms and weakness.  Neurology consulted, TIA versus partial complex seizure.  Brain imaging negative for acute infarct.  Carotid ultrasound shows right-sided 50 to 60% stenosis without plaque.  Symptoms resolved.     3.  Hypertension: hypertensive  urgency on admission.  - Patient prescribed carvedilol , clonidine , diltiazem , losartan , and spironolactone . Hydralazine , labetalol  for PRN use            - Blood pressure 1142/54   4. Secondary Hyperparathyroidism:             - continue sevelamer  with meals             - Calcium  acceptable however phosphorus elevated, likely due to missed dialysis treatment.  Awaiting updated phosphorus.   5. Anemia with chronic kidney disease: Mircera as outpatient. Holding ESA due to acute ischemic event.              -Hgb 9.3.  Will continue to monitor   LOS: 22 Graylee Arutyunyan 7/18/202511:19 AM

## 2024-03-29 NOTE — Progress Notes (Signed)
 Progress Note   Patient: Tracy Jennings FMW:[id number] DOB: Apr 12, 1956 DOA: 03/05/2024     22 DOS: the patient was seen and examined on 03/29/2024      Brief hospital course:   Tracy Jennings is a 68 year old female with ESRD on HD MWF, HTN, HLD, DM, PVD, dementia, chronic recurrent pancreatitis, stroke, cervical cancer, HIT, who presents with slurred speech, left facial spasm, left-sided weakness.  Patient had syncopal episode on 6/23 and was seen in the ED.  At that time she was found to have O2 desaturation to 80% due to pulmonary edema on chest x-ray as well as hyperkalemia with a potassium of 6.2.  She had negative head CT during that admission.  She received hemodialysis and then was discharged home.  Her last known normal was 6/24 at 1700.  Upon arrival to the ED patient's confusion had resolved entirely.  Labs in the ED mostly unremarkable, potassium 4.6, creatinine consistent with ESRD.  Blood pressure was found to be 252/79.  Patient was placed in telemetry bed for observation.  Code stroke was called.Blood pressure resolved.  Head CT was without acute intracranial abnormality.  Chronic infarct seen on right parietal lobe and extensive chronic microvascular ischemic changes with remote lacunar infarcts in the right caudate and right thalamus appreciated.  Patient was admitted for workup of presumed TIA.  LDL 96, hemoglobin A1c 5.1%.  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  MRI brain revealed chronic encephalomalacia within the right parietal lobe and advanced diffuse cerebral white matter disease.  Patient underwent carotid Dopplers which revealed elevated velocity within the distal right internal carotid artery suggesting stenosis of 50 to 69% though no significant plaque identified and thus this may be artifactual. Patient underwent hemodialysis on 6/25 but left her session early AMA.she attempted to leave the hospital AMA but during this event began  hallucinating and loss capacity.  Psychiatry was consulted and met with the patient on 6/26 and confirms acute delirium, currently the patient does not have capacity to make medical decisions.  She has been started on Seroquel  for presumed Lewy body dementia.  She has had persistent agitation and Seroquel  has been slowly increased. She is currently medically stable and undergoing routine HD while awaiting safe dispo. TOC is consulted.    BP is labile and high.  Family voiced concern about isosorbide  mononitrate.  Nephrology discontinued isosorbide  and possibly add some other medications.  Patient is currently on losartan , Coreg , clonidine , diltiazem , doxazosin  and the spironolactone .   7/6.  Case discussed with nephrology and switching clonidine  pills over to clonidine  patch.  Imdur  discontinued the day prior.  Family interested in taking patient home rather than going to rehab 7/7.  Patient seen on dialysis and blood pressure on the lower side.  Continue to monitor closely.  Patient complains of pains and was given Tylenol . 7/8.  Patient did not sleep last night.  She was able to answer questions and follow commands for me this morning.  Was sleeping during the day.     7/9 - Pt had dialysis. Otherwise uneventful day. 7/10 - new onset nausea/vomiting, leukocytosis, no tolerating PO meds, BP's uncontrolled 7/11 - BP's still significantly elevated. Pt vomited her PO meds yesterday AM.  7/12 - BP's improved with systolic's in 140's to 160's.  No N/V reported.  +Visual hallucinations, but no agitation issues reported since I assumed care. 7/13 -- BP's improved.  No N/V or complaints of stomach issues.   7/14 --  pt refused AM dialysis and PO meds, recurrent stomach pains but no N/V. Normal lipase 7/15 -- no acute issues, ongoing abdominal pain from constipation/gas pains 7/16-7/17-patient having hallucination and psychiatry eval requested     Assessment and Plan:   Hypertensive urgency BP's  remain overall elevated, at times still up at 190's-200's systolic.  Improved with increased dose of Cardizem  7/11.  BP's labile in dialysis. Continue clonidine  patch, Coreg , Cardizem  CD, losartan , spironolactone  --Increased Cardizem  from 120 >> 180 mg --Nephrology dc'd doxazosin  -- apparently pt has had issues in the past with this medication in outpatient setting --Titrate regimen as needed   Leukocytosis - Resolved.  Monitor CBC Blood culture showing no growth to date   Nausea and Vomiting-resolved Intermittent stomach pains -  Continue simethicone  Continue anticonstipation medication   Acute delirium Family have agreed with discharge to skilled nursing facility --Continued Seroquel  25 mg TID + 75 mg at bedtime --PRN Seroquel  also available for uncontrolled agitation/anxiety. Continue to avoid sedating medication Continue delirium precaution   Dementia with behavioral disturbance (HCC) Patient on Seroquel  and as needed Haldol , per psychiatry Patient was seen by psychiatry does not have the capacity to leave AMA.  Seen by psychiatry on 03/13/24  Psychiatry reevaluation requested   ESRD (end stage renal disease) [facility]) Nephrology following.  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   Hyperlipidemia Continue Lipitor   TIA (transient ischemic attack) Ruled out.  MRI of the brain shows chronic encephalomalacia within the right parietal lobe and advanced diffuse cerebral white matter disease, carotid Doppler showing elevated velocity in the distal right internal carotid suggesting stenosis of 50 to 69%, EEG showing cortical dysfunction arising from the right temporal parietal region secondary to underlying structural abnormality no seizure or left to form discharges, EF normal range.   Hyponatremia Dialysis for volume management      Subjective: This morning patient had rapid response called on her on account of unresponsiveness and brief moment of bradycardia At the time we arrived at bedside patient telemetry reading showed pulse in the 60s from the initial recording of 30s by dialysis denies. She denied chest pain nausea or vomiting however appeared altered No sedation medication observed   Physical Exam:   General exam: awake, alert, no acute distress, talkative HEENT: moist mucus membranes, hearing grossly normal  Respiratory system: on room air, normal respiratory effort. Cardiovascular system: normal S1/S2, RRR, no pedal edema.   Gastrointestinal system: soft, NT, ND, no HSM felt, +bowel sounds. Central nervous system: A&O x 2. no gross focal neurologic deficits, normal speech Extremities: moves all , no edema, normal tone Psychiatry: normal mood, congruent affect    Family Communication: No family at bedside     Disposition: Status is: Inpatient Remains inpatient appropriate because: needs SNF placement      Planned Discharge Destination: SNF      Data Reviewed:    Latest Ref Rng & Units 03/29/2024    5:20 AM 03/28/2024    4:25 AM 03/27/2024    6:25 AM  CBC  WBC 4.0 - 10.5 K/uL 12.8  11.5  13.2   Hemoglobin 12.0 - 15.0 g/dL 9.3  9.4  89.9   Hematocrit 36.0 - 46.0 % 29.2  29.0  30.3   Platelets 150 - 400 K/uL 265  231  205        Latest Ref Rng &  Units 03/29/2024    5:20 AM 03/28/2024    4:25 AM 03/27/2024    6:25 AM  BMP  Glucose 70 - 99 mg/dL 829  98  90   BUN 8 - 23 mg/dL 60  48  98   Creatinine 0.44 - 1.00 mg/dL 1.53  3.09  87.62   Sodium 135 - 145 mmol/L 137  135  130   Potassium 3.5 - 5.1 mmol/L 4.0  3.7  4.3   Chloride 98 - 111 mmol/L 98  97  89   CO2 22 - 32 mmol/L 24  27  23    Calcium  8.9 - 10.3 mg/dL 8.3  8.3  8.5     Vitals:   03/29/24 1130 03/29/24 1139 03/29/24 1140 03/29/24 1317  BP: (!) 109/57 (!) 133/53 (!) 131/53 (!) 153/52  Pulse: 68 69 69 73  Resp: 14 13 12    Temp:    97.7 F (36.5 C)   TempSrc:      SpO2: 98% 97% 96% 99%  Weight:   52 kg   Height:         Author: Drue ONEIDA Potter, MD 03/29/2024 4:01 PM  For on call review [email or web address].

## 2024-03-29 NOTE — Plan of Care (Signed)
   Problem: Education: Goal: Knowledge of General Education information will improve Description Including pain rating scale, medication(s)/side effects and non-pharmacologic comfort measures Outcome: Progressing
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
## 2024-03-29 NOTE — Progress Notes (Signed)
 PT Cancellation Note  Patient Details Name: Tracy Jennings MRN: [id number] DOB: 1956/01/01   Cancelled Treatment:    Reason Eval/Treat Not Completed: Other (comment).  Pt currently off floor at dialysis.  Will re-attempt PT session at a later date/time as able.  Damien Caulk, PT 03/29/24, 10:06 AM

## 2024-03-29 NOTE — Progress Notes (Signed)
 No blood return from port this morning. Contacted IV team for guidance. Will notify lab to come draw labs peripherally.

## 2024-03-29 NOTE — Consult Note (Addendum)
 [facility] Follow [facility]  Patient Name: .RANDELL DETTER  MRN: [id number]  DOB: 12-19-1955  Consult Order details:  Orders (From admission, onward)     Start     Ordered   03/07/24 0935  IP CONSULT TO PSYCHIATRY       Ordering Provider: Leesa Kast, DO  Provider:  (Not yet assigned)  Question Answer Comment  [facility]   Reason for Consult? Capacity eval to leave [facility]      03/07/24 0934             Mode of Visit: In person    Psychiatry Consult Evaluation  Service Date: March 29, 2024 LOS:  LOS: 22 days  Chief Complaint Capacity eval  Primary Psychiatric Diagnoses  Delirium due to multiple etiologies- uremia 2.   3.    Assessment  Chasitee D Cottman is a 68 y.o. female admitted: Medicallyfor 03/05/2024  5:50 PM  with ESRD on HD MWF, HTN, HLD, DM, PVD, dementia, chronic recurrent pancreatitis, stroke, cervical cancer, HIT, who presents with slurred speech, left facial spasm, left-sided weakness.  Patient had syncopal episode on 6/23 and was seen in the ED.  At that time she was found to have O2 desaturation to 80% due to pulmonary edema on chest x-ray as well as hyperkalemia with a potassium of 6.2.  She had negative head CT during that admission.  She received hemodialysis and then was discharged home.  Her last known normal was 6/24 at 1700.  Upon arrival to the ED patient's confusion had resolved entirely.  Labs in the ED mostly unremarkable, potassium 4.6, creatinine consistent with ESRD.  Blood pressure was found to be 252/79.  Patient was placed in telemetry bed for observation.  Code stroke was called.Blood pressure resolved.  Head CT was without acute intracranial abnormality.  Chronic infarct seen on right parietal lobe and extensive chronic microvascular ischemic changes with remote lacunar infarcts in the right caudate and right thalamus appreciated.  Patient was admitted for workup of presumed TIA.  LDL 96, hemoglobin A1c  5.1%.  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  MRI brain revealed chronic encephalomalacia within the right parietal lobe and advanced diffuse cerebral white matter disease.  Patient underwent carotid Dopplers which revealed elevated velocity within the distal right internal carotid artery suggesting stenosis of 50 to 69% though no significant plaque identified and thus this may be artifactual. Patient underwent hemodialysis on 6/25 but left her session early AMA.  When she returned to the room she reported that she wanted to leave AGAINST MEDICAL ADVICE. Family expressed significant concerns about patient signing AMA and patient reportedly couldn't even sign the paper properly. Psychiatry is consulted to evaluate for capacity due to concerns of delirium.   03/29/2024 on assessment patient remains very confused and is not oriented and is seen after the hemodialysis.  She was awake but drowsy.  Per primary team patient is still displaying visual hallucinations as part of the delirium.  Will obtain an EKG to make sure the QTc is not prolonged as her last EKG 6/30 shows 486/QTc.   Seroquel  was initially selected as antipsychotic as after discussing with neurology there was a suspicion of Lewy body dementia and wanted to use least dopamine blocking agents to prevent any side effects like Parkinson's. Also patient is under cardiac monitoring for Afib. Will continue Seroquel  at this time with increasing at bedtime dosing to 100mg .  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   If patient continues to have ongoing visual hallucinations with Seroquel  will consider Zyprexa at night to see if it makes any difference.  ## Medical Decision Making Capacity: Patient's decision of leaving the hospital not completing the treatment is in the context of psychosis, visual hallucinations due to delirium.  Given the psychosis, altered mental status patient is unable to make a reasonable decision at this time.  It is our clinical opinion that patient lacks capacity as of now to make the decision of leaving AMA.  Will recommend to reach out to family members for surrogate decision making.  ## Further Work-up:  -- Patient is seen and followed by neurology  -   ## Disposition:-- There are no psychiatric contraindications to discharge at this time  ## Behavioral / Environmental: -Delirium Precautions: Delirium Interventions for Nursing and Staff: - RN to open blinds every AM. - To Bedside: Glasses, hearing aide, and pt's own shoes. Make available to patients. when possible and encourage use. - Encourage po fluids when appropriate, keep fluids within reach. - OOB to chair with meals. - Passive ROM exercises to all extremities with AM & PM care. - RN to assess orientation to person, time and place QAM and PRN. - Recommend extended visitation hours with familiar family/friends as feasible. - Staff to minimize disturbances at night. Turn off television when pt asleep or when not in use.    ## Safety and Observation Level:  - Based on my clinical evaluation, I estimate the patient to be at low risk of self harm in the current setting. - At this time, we recommend  routine. This decision is based on my review of the chart including patient's  history and current presentation, interview of the patient, mental status examination, and consideration of suicide risk including evaluating suicidal ideation, plan, intent, suicidal or self-harm behaviors, risk factors, and protective factors. This judgment is based on our ability to directly address suicide risk, implement suicide prevention strategies, and develop a safety plan while the patient is in the clinical setting. Please contact our team if there is a concern that risk level has changed.  CSSR Risk Category:C-SSRS RISK CATEGORY: No Risk  Suicide Risk Assessment: Patient has following modifiable risk factors for suicide: None identified at this time Patient has following non-modifiable or demographic risk factors for suicide: None identified at this time Patient has the following protective factors against suicide: Supportive family and Cultural, spiritual, or religious beliefs that discourage suicide  Thank you for this consult request. Recommendations have been communicated to the primary team.  We will continue to follow-up at this time.   Raza Bayless, MD       History of Present Illness  Jasslyn Finkel Bruster is a 68 y.o. female admitted: Medicallyfor 03/05/2024  5:50 PM  with ESRD on HD  MWF, HTN, HLD, DM, PVD, dementia, chronic recurrent pancreatitis, stroke, cervical cancer, HIT, who presents with slurred speech, left facial spasm, left-sided weakness.  Patient had syncopal episode on 6/23 and was seen in the ED.  At that time she was found to have O2 desaturation to 80% due to pulmonary edema on chest x-ray as well as hyperkalemia with a potassium of 6.2.  She had negative head CT during that admission.  She received hemodialysis and then was discharged home.  Her last known normal was 6/24 at 1700.  Upon arrival to the ED patient's confusion had resolved entirely.  Labs in the ED mostly unremarkable, potassium 4.6, creatinine consistent with ESRD.  Blood pressure was found to be  252/79.  Patient was placed in telemetry bed for observation.  Code stroke was called.Blood pressure resolved.  Head CT was without acute intracranial abnormality.  Chronic infarct seen on right parietal lobe and extensive chronic microvascular ischemic changes with remote lacunar infarcts in the right caudate and right thalamus appreciated.  Patient was admitted for workup of presumed TIA.  LDL 96, hemoglobin A1c 5.1%.  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  MRI brain revealed chronic encephalomalacia within the right parietal lobe and advanced diffuse cerebral white matter disease.  Patient underwent carotid Dopplers which revealed elevated velocity within the distal right internal carotid artery suggesting stenosis of 50 to 69% though no significant plaque identified and thus this may be artifactual. Patient underwent hemodialysis on 6/25 but left her session early AMA.  When she returned to the room she reported that she wanted to leave AGAINST MEDICAL ADVICE. Family expressed significant concerns about patient signing AMA and patient reportedly couldn't even sign the paper properly. Psychiatry is consulted to evaluate for capacity due to concerns of delirium. 03/14/24: On assessment patient is noted to be in dialysis room resting in the bed.  She was able to wake up and answer the questions.  She was unable to answer any of the orientation questions.  Patient was updated that she is at [facility]  When asked about the procedure she could not recall and provider educated her that she is getting dialysis.  She states she used to get PD at home.  She talks about how too many people including children are coming into the room and bothering her.  She reports that that being very loud.  The staff does mention that she is mistaking the staff members for some other family members of hers.  She is not endorsing SI/HI/plan.  Psychiatric and Social History  Psychiatric History:  Information  collected from patient  Prev Dx/Sx: denies Current Psych Provider: none reported Home Meds (current): none reported Previous Med Trials: denies Therapy: denies  Prior Psych Hospitalization: denies  Prior Self Harm: denies Prior Violence: denies  Family Psych History: denies Family Hx suicide: denies  Social History:   Educational Hx: HS Occupational Hx: disability Legal Hx: denies Living Situation: lives by herself but has family support Spiritual Hx: denies Access to weapons/lethal means: denies   Substance History Alcohol: denies  Tobacco: denies Illicit drugs: denies Prescription drug abuse: denies Rehab hx: denies  Exam Findings  Physical Exam: Reviewed and agree with the physical exam findings Vital Signs:  Temp:  [97.7 F (36.5 C)-98.6 F (37 C)] 97.7 F (36.5 C) (07/18 1140) Pulse Rate:  [58-69] 69 (07/18 1140) Resp:  [8-23] 12 (07/18 1140) BP: (91-147)/(40-94) 131/53 (07/18 1140) SpO2:  [96 %-100 %] 96 % (07/18  1140) Weight:  [40.1 kg-52 kg] 52 kg (07/18 1140) Blood pressure (!) 131/53, pulse 69, temperature 97.7 F (36.5 C), resp. rate 12, height 5' 3 (1.6 m), weight 52 kg, SpO2 96%. Body mass index is 20.31 kg/m.    Mental Status Exam: General Appearance: Casual  Orientation:  Other:  to self,month, year  Memory:  Immediate;   Fair Recent;   Poor Remote;   Poor  Concentration:  Concentration: Poor and Attention Span: Poor  Recall:  Fair  Attention  Poor  Eye Contact:  Minimal  Speech:  Normal Rate  Language:  Fair  Volume:  Normal  Mood: fine  Affect:  Appropriate  Thought Process:  Disorganized  Thought Content:  coherent  Suicidal Thoughts:  No  Homicidal Thoughts:  No  Judgement:  Impaired  Insight:  Shallow  Psychomotor Activity:  Normal  Akathisia:  No  Fund of Knowledge:  Fair      Assets:  Communication Skills Desire for Improvement Housing Resilience  Cognition:  Impaired,  Mild  ADL's:  impaired  AIMS (if indicated):         Other History   These have been pulled in through the EMR, reviewed, and updated if appropriate.  Family History:  The patient's family history includes Cancer in her father; Diabetes in her maternal grandfather, maternal grandmother, paternal grandfather, paternal grandmother, and sister; Gout in her mother; Hypertension in her mother; Stroke in her mother.  Medical History: Past Medical History:  Diagnosis Date   Anemia 04/2018   low iron. to be started on supplements   Cervical cancer (HCC)    CKD (chronic kidney disease)    Stage IV   Complication of anesthesia    receceived too much anesthesia, that she was in coma for a couple days    COVID-19 virus detected 10/28/2019   Diabetes mellitus without complication (HCC)    type II   ESRD (end stage renal disease) (HCC)    Heart murmur    followed as a child only   HSIL (high grade squamous intraepithelial lesion) on Pap smear of cervix    Hyperlipidemia associated with type 2 diabetes mellitus (HCC)    Hypertension    Pancreatitis    Peripheral vascular disease (HCC)     Surgical History: Past Surgical History:  Procedure Laterality Date   AMPUTATION TOE Left 2013   2nd toe. tip of toe (toe nail was infected)   AV FISTULA PLACEMENT Left 05/11/2018   Procedure: ARTERIOVENOUS (AV) FISTULA CREATION;  Surgeon: Jama Cordella MATSU, MD;  Location: [facility] OR[facility];  Service: Vascular;  Laterality: Left;   CATARACT EXTRACTION     CERVICAL CONIZATION W/BX N/A 04/08/2020   Procedure: CONIZATION CERVIX WITH BIOPSY;  Surgeon: Mancil Barter, MD;  Location: [facility];  Service: Gynecology;  Laterality: N/A;   CHOLECYSTECTOMY  2014   COLONOSCOPY     COLONOSCOPY WITH PROPOFOL  N/A 01/10/2018   Procedure: COLONOSCOPY WITH PROPOFOL ;  Surgeon: Toledo, Ladell POUR, MD;  Location: [facility];  Service: Gastroenterology;  Laterality: N/A;   DIALYSIS/PERMA CATHETER INSERTION N/A 05/21/2018   Procedure: DIALYSIS/PERMA CATHETER INSERTION;  Surgeon:  Marea Selinda RAMAN, MD;  Location: [facility];  Service: Cardiovascular;  Laterality: N/A;   DIALYSIS/PERMA CATHETER INSERTION N/A 07/14/2020   Procedure: DIALYSIS/PERMA CATHETER INSERTION;  Surgeon: Marea Selinda RAMAN, MD;  Location: [facility];  Service: Cardiovascular;  Laterality: N/A;   DIALYSIS/PERMA CATHETER REMOVAL N/A 04/11/2019   Procedure: DIALYSIS/PERMA CATHETER REMOVAL;  Surgeon: Marea,  Selinda RAMAN, MD;  Location: [facility];  Service: Cardiovascular;  Laterality: N/A;   EYE SURGERY Bilateral 2018   cataract extractions   IR IMAGING GUIDED PORT INSERTION  06/05/2020   THROMBECTOMY W/ EMBOLECTOMY  05/11/2018   Procedure: THROMBECTOMY ARTERIOVENOUS FISTULA;  Surgeon: Jama Cordella MATSU, MD;  Location: [facility];  Service: Vascular;;   TUBAL LIGATION  1984     Medications:   Current Facility-Administered Medications:    (feeding supplement) PROSource Plus liquid 30 mL, 30 mL, Oral, TID BM, Djan, Prince T, MD, 30 mL at 03/28/24 1052   acetaminophen  (TYLENOL ) tablet 650 mg, 650 mg, Oral, Q4H PRN, 650 mg at 03/26/24 2008 **OR** acetaminophen  (TYLENOL ) 160 MG/5ML solution 650 mg, 650 mg, Per Tube, Q4H PRN **OR** acetaminophen  (TYLENOL ) suppository 650 mg, 650 mg, Rectal, Q4H PRN, Niu, Xilin, MD   aspirin  suppository 300 mg, 300 mg, Rectal, Daily **OR** aspirin  tablet 325 mg, 325 mg, Oral, Daily, Niu, Xilin, MD, 325 mg at 03/28/24 1053   atorvastatin  (LIPITOR) tablet 40 mg, 40 mg, Oral, Daily, Niu, Xilin, MD, 40 mg at 03/28/24 1054   bisacodyl  (DULCOLAX) EC tablet 5 mg, 5 mg, Oral, Daily PRN, Fausto Sor A, DO, 5 mg at 03/23/24 1425   calcium  carbonate (TUMS - dosed in mg elemental calcium ) chewable tablet 400 mg of elemental calcium , 2 tablet, Oral, BID PRN, Fausto Sor A, DO, 400 mg of elemental calcium  at 03/25/24 0843   carvedilol  (COREG ) tablet 25 mg, 25 mg, Oral, BID WC, Breeze, Shantelle, NP, 25 mg at 03/28/24 1611   Chlorhexidine  Gluconate Cloth 2 % PADS 6 each, 6  each, Topical, Daily, Dezii, Alexandra, DO, 6 each at 03/28/24 1234   cloNIDine  (CATAPRES  - Dosed in mg/24 hr) patch 0.3 mg, 0.3 mg, Transdermal, Weekly, Kolluru, Sarath, MD, 0.3 mg at 03/24/24 1425   diltiazem  (CARDIZEM  CD) 24 hr capsule 180 mg, 180 mg, Oral, Daily, Fausto Sor A, DO, 180 mg at 03/28/24 1054   feeding supplement (BOOST / RESOURCE BREEZE) liquid 1 Container, 1 Container, Oral, TID BM, Djan, Drue DASEN, MD   haloperidol  lactate (HALDOL ) injection 1-2 mg, 1-2 mg, Intramuscular, Q6H PRN, Mansy, Jan A, MD, 2 mg at 03/18/24 0055   hydrALAZINE  (APRESOLINE ) injection 10 mg, 10 mg, Intravenous, Q6H PRN, Mansy, Jan A, MD, 10 mg at 03/25/24 1619   hydrALAZINE  (APRESOLINE ) tablet 25 mg, 25 mg, Oral, Q6H PRN, Elgergawy, Dawood S, MD, 25 mg at 03/19/24 2105   labetalol  (NORMODYNE ) injection 20 mg, 20 mg, Intravenous, Q3H PRN, Mansy, Jan A, MD, 20 mg at 03/21/24 9344   LORazepam  (ATIVAN ) injection 2 mg, 2 mg, Intravenous, Q2H PRN, Niu, Xilin, MD, 2 mg at 03/16/24 0502   losartan  (COZAAR ) tablet 100 mg, 100 mg, Oral, Daily, Breeze, Shantelle, NP, 100 mg at 03/28/24 1945   multivitamin (RENA-VIT) tablet 1 tablet, 1 tablet, Oral, QHS, Djan, Prince T, MD, 1 tablet at 03/28/24 2109   ondansetron  (ZOFRAN ) injection 4 mg, 4 mg, Intravenous, Q8H PRN, Niu, Xilin, MD, 4 mg at 03/25/24 1809   pantoprazole  (PROTONIX ) EC tablet 40 mg, 40 mg, Oral, Daily, Elesa Perkins, RPH, 40 mg at 03/28/24 1611   polyethylene glycol (MIRALAX  / GLYCOLAX ) packet 17 g, 17 g, Oral, Daily, Wieting, Richard, MD, 17 g at 03/28/24 1053   promethazine  (PHENERGAN ) 12.5 mg in sodium chloride  0.9 % 50 mL IVPB, 12.5 mg, Intravenous, Q6H PRN, Mansy, Madison LABOR, MD, Stopped at 03/25/24 1130   QUEtiapine  (SEROQUEL ) tablet 25 mg, 25 mg,  Oral, TID, Yovani Cogburn, MD, 25 mg at 03/28/24 2109   QUEtiapine  (SEROQUEL ) tablet 25 mg, 25 mg, Oral, Q8H PRN, Erie Radu, MD, 25 mg at 03/14/24 2357   QUEtiapine  (SEROQUEL ) tablet 75 mg, 75 mg,  Oral, Daily, Wieting, Richard, MD, 75 mg at 03/28/24 1940   senna-docusate (Senokot-S) tablet 1 tablet, 1 tablet, Oral, BID, Fausto Burnard LABOR, DO, 1 tablet at 03/28/24 2109   sodium chloride  flush (NS) 0.9 % injection 10-40 mL, 10-40 mL, Intracatheter, PRN, Dezii, Alexandra, DO, 10 mL at 03/10/24 0830   sodium chloride  flush (NS) 0.9 % injection 10-40 mL, 10-40 mL, Intracatheter, Q12H, Paudel, Keshab, MD, 10 mL at 03/28/24 2110   sodium chloride  flush (NS) 0.9 % injection 10-40 mL, 10-40 mL, Intracatheter, PRN, Paudel, Keshab, MD   spironolactone  (ALDACTONE ) tablet 25 mg, 25 mg, Oral, Daily, Lenon Pons L, MD, 25 mg at 03/28/24 1054   traZODone  (DESYREL ) tablet 50 mg, 50 mg, Oral, QHS, Dennise Capri, MD, 50 mg at 03/28/24 2109  Allergies: Allergies  Allergen Reactions   Amlodipine  Swelling    Knees down to ankles Knees down to ankles   Hctz [Hydrochlorothiazide]     pancreatitis   Heparin  Other (See Comments)    Hx  HIT,   Pt reports cardiac arrest when given heparin    Lmw Heparin      Reports cardiac arrest when given heparin    Other Other (See Comments)    Reports cardiac arrest when given during surgery Reports cardiac arrest when given during surgery Reports cardiac arrest when given during surgery    Sulfa Antibiotics Rash   Lactose    Dairy Aid [Tilactase]     Runny nose   Hydralazine      Nausea and rash    Kairah Leoni, MD

## 2024-03-29 NOTE — Progress Notes (Signed)
 Responded to overhead page of rapid response in Dialysis. Pt awake, has a pulse and breathing. On arrival Dorinda Homans MD was present, pt RN, RR ICU CN Vernell Horseman RN. Was reported pt was briefly not responsive and HR had gone down to 30bpm. Blood sugar checked by staff 126. When reviewed strip it appeared pt had a brief run of HB, as could see P wave, but no QRS then pwave with QRS. It appeared she was briefly dropping every other QRS complex. When I arrived, it appeared pt was in normal sinus rhythm with an occasional pvc. At conclusion of rapid response pt did not need a higher level of care. Were there approximately 10 minutes. MD was still in unit when I departed.

## 2024-03-29 NOTE — Progress Notes (Addendum)
   03/29/24 1140  Vitals  Temp 97.7 F (36.5 C)  Pulse Rate 69 (Simultaneous filing. User may not have seen previous data.)  Resp 12 (Simultaneous filing. User may not have seen previous data.)  BP (!) 131/53 (Simultaneous filing. User may not have seen previous data.)  SpO2 96 % (Simultaneous filing. User may not have seen previous data.)  O2 Device Room Air  Weight 52 kg (bed weight)  Oxygen Therapy  Patient Activity (if Appropriate) In bed  Pulse Oximetry Type Continuous  Oximetry Probe Site Changed No  Post Treatment  Dialyzer Clearance Lightly streaked  Liters Processed 84  Fluid Removed (mL) 138 mL  Tolerated HD Treatment Yes  Post-Hemodialysis Comments Please see notes regarding pt treatment. Pt completed full treatment time. Pt only removed .  AVG/AVF Arterial Site Held (minutes) 6 minutes  AVG/AVF Venous Site Held (minutes) 6 minutes

## 2024-03-29 NOTE — TOC Progression Note (Signed)
 Transition of Care [facility]) - Progression Note    Patient Details  Name: Tracy Jennings MRN: [id number] Date of Birth: 08-12-1956  [facility] CM/SW Contact  Dalia GORMAN Fuse, RN Phone Number: 03/29/2024, 2:55 PM  Clinical Narrative:    Late entry: Yesterday (7/17), patient asked to speak with TOC about her discharge. Per the patient she paid for her home for over 40 years and would like to return Home with [facility] PT. She has a lady that she pays to clean her home and there is also a lady who lives with her that can assist with her care. Not to mention her sisters, [name] and [name], have verbalized they can take turns caring for her. The patient advised that her brother, [name], is the one wanting her to go to SNF. The patient explained that she doesn't want to loose her home. TOC explained that the plan is for her to go to [facility] for STR and that insurance has authorized the stay. Typically, the state doesn't require people to relinquish property for short term stays. During the conversation the patient continued to ask about a person in the corner who wasn't in the room.   (11:00 AM) Patient out of the room for dialysis today when TOC went to visit. Patient with a bradycardic episode during dialysis today.  DC Disposition is Home with HH PT/OT vs STR at [facility]    Expected Discharge Plan: Skilled Nursing Facility Barriers to Discharge: No SNF bed  Expected Discharge Plan and Services   Discharge Planning Services: CM Consult   Living arrangements for the past 2 months: Single Family Home                                       Social Determinants of Health (SDOH) Interventions SDOH Screenings   Food Insecurity: Food Insecurity Present (03/06/2024)  Housing: Low Risk  (03/06/2024)  Transportation Needs: No Transportation Needs (03/06/2024)  Utilities: Not At Risk (03/06/2024)  Financial Resource Strain: Low Risk  (01/09/2024)   Received from [facility]  Social Connections: Unknown (03/06/2024)  Tobacco Use: Low Risk  (03/06/2024)    Readmission Risk Interventions     No data to display

## 2024-03-30 DIAGNOSIS — I160 Hypertensive urgency: Secondary | ICD-10-CM | POA: Diagnosis not present

## 2024-03-30 LAB — BASIC METABOLIC PANEL WITH GFR
Anion gap: 9 (ref 5–15)
BUN: 37 mg/dL — ABNORMAL HIGH (ref 8–23)
CO2: 30 mmol/L (ref 22–32)
Calcium: 8.5 mg/dL — ABNORMAL LOW (ref 8.9–10.3)
Chloride: 100 mmol/L (ref 98–111)
Creatinine, Ser: 5.17 mg/dL — ABNORMAL HIGH (ref 0.44–1.00)
GFR, Estimated: 9 mL/min — ABNORMAL LOW (ref >60)
Glucose, Bld: 143 mg/dL — ABNORMAL HIGH (ref 70–99)
Potassium: 4.2 mmol/L (ref 3.5–5.1)
Sodium: 139 mmol/L (ref 135–145)

## 2024-03-30 LAB — CBC WITH DIFFERENTIAL/PLATELET
Abs Immature Granulocytes: 0.1 K/uL — ABNORMAL HIGH (ref 0.00–0.07)
Basophils Absolute: 0.1 K/uL (ref 0.0–0.1)
Basophils Relative: 0 %
Eosinophils Absolute: 0.2 K/uL (ref 0.0–0.5)
Eosinophils Relative: 1 %
HCT: 30 % — ABNORMAL LOW (ref 36.0–46.0)
Hemoglobin: 9.6 g/dL — ABNORMAL LOW (ref 12.0–15.0)
Immature Granulocytes: 1 %
Lymphocytes Relative: 4 %
Lymphs Abs: 0.5 K/uL — ABNORMAL LOW (ref 0.7–4.0)
MCH: 30.3 pg (ref 26.0–34.0)
MCHC: 32 g/dL (ref 30.0–36.0)
MCV: 94.6 fL (ref 80.0–100.0)
Monocytes Absolute: 0.7 K/uL (ref 0.1–1.0)
Monocytes Relative: 5 %
Neutro Abs: 12.4 K/uL — ABNORMAL HIGH (ref 1.7–7.7)
Neutrophils Relative %: 89 %
Platelets: 273 K/uL (ref 150–400)
RBC: 3.17 MIL/uL — ABNORMAL LOW (ref 3.87–5.11)
RDW: 14.8 % (ref 11.5–15.5)
WBC: 13.9 K/uL — ABNORMAL HIGH (ref 4.0–10.5)
nRBC: 0 % (ref 0.0–0.2)

## 2024-03-30 LAB — GLUCOSE, CAPILLARY: Glucose-Capillary: 141 mg/dL — ABNORMAL HIGH (ref 70–99)

## 2024-03-30 MED ORDER — MORPHINE SULFATE (PF) 2 MG/ML IV SOLN
2.0000 mg | INTRAVENOUS | Status: DC | PRN
  Administered 2024-03-30: 2 mg via INTRAVENOUS
  Filled 2024-03-30: qty 1

## 2024-03-30 NOTE — Progress Notes (Signed)
 [facility]  PROGRESS NOTE   Subjective:   Patient is comfortable.  Objective:  Vital signs: Blood pressure (!) 115/35, pulse 77, temperature 98.3 F (36.8 C), resp. rate 16, height 5' 3 (1.6 m), weight 52 kg, SpO2 100%.  Intake/Output Summary (Last 24 hours) at 03/30/2024 1620 Last data filed at 03/30/2024 1027 Gross per 24 hour  Intake 120 ml  Output --  Net 120 ml   Filed Weights   03/27/24 1218 03/29/24 0746 03/29/24 1140  Weight: 42.3 kg 40.1 kg 52 kg     Physical Exam: General:  No acute distress  Head:  Normocephalic, atraumatic. Moist oral mucosal membranes  Eyes:  Anicteric  Neck:  Supple  Lungs:   Clear to auscultation, normal effort  Heart:  S1S2 no rubs  Abdomen:   Soft, nontender, bowel sounds present  Extremities:  peripheral edema.  Neurologic:  Awake, alert, following commands  Skin:  No lesions  Access:     Basic Metabolic Panel: Recent Labs  Lab 03/25/24 0616 03/27/24 0625 03/28/24 0425 03/29/24 0500 03/29/24 0520 03/30/24 0620  NA 133* 130* 135  --  137 139  K 4.1 4.3 3.7  --  4.0 4.2  CL 92* 89* 97*  --  98 100  CO2 25 23 27   --  24 30  GLUCOSE 108* 90 98  --  170* 143*  BUN 72* 98* 48*  --  60* 37*  CREATININE 8.94* 12.37* 6.90*  --  8.46* 5.17*  CALCIUM  9.1 8.5* 8.3*  --  8.3* 8.5*  PHOS 4.9* 6.5*  --  3.4  --   --    GFR: Estimated Creatinine Clearance: 8.8 mL/min (A) (by C-G formula based on SCr of 5.17 mg/dL (H)).  Liver Function Tests: Recent Labs  Lab 03/25/24 0616 03/25/24 1531 03/27/24 0625  AST  --  13*  --   ALT  --  9  --   ALKPHOS  --  36*  --   BILITOT  --  0.5  --   PROT  --  6.8  --   ALBUMIN 3.4* 3.2* 2.8*   Recent Labs  Lab 03/25/24 1531  LIPASE 30   No results for input(s): AMMONIA in the last 168 hours.  CBC: Recent Labs  Lab 03/27/24 0625 03/28/24 0425 03/29/24 0520 03/30/24 0830  WBC 13.2* 11.5* 12.8* 13.9*  NEUTROABS 11.6* 9.7* 11.2* 12.4*  HGB 10.0* 9.4* 9.3* 9.6*  HCT  30.3* 29.0* 29.2* 30.0*  MCV 92.4 92.1 93.9 94.6  PLT 205 231 265 273     HbA1C: Hemoglobin A1C  Date/Time Value Ref Range Status  06/13/2014 05:34 AM 11.2 (H) 4.2 - 6.3 % Final    Comment:    The American Diabetes Association recommends that a primary goal of therapy should be <7% and that physicians should reevaluate the treatment regimen in patients with HbA1c values consistently >8%.    Hgb A1c MFr Bld  Date/Time Value Ref Range Status  03/05/2024 06:10 PM 5.1 4.8 - 5.6 % Final    Comment:    (NOTE) Diagnosis of Diabetes The following HbA1c ranges recommended by the American Diabetes Association (ADA) may be used as an aid in the diagnosis of diabetes mellitus.  Hemoglobin             Suggested A1C NGSP%              Diagnosis  <5.7  Non Diabetic  5.7-6.4                Pre-Diabetic  >6.4                   Diabetic  <7.0                   Glycemic control for                       adults with diabetes.    11/03/2019 05:36 PM 6.3 (H) 4.8 - 5.6 % Final    Comment:    (NOTE) Pre diabetes:          5.7%-6.4% Diabetes:              >6.4% Glycemic control for   <7.0% adults with diabetes     Urinalysis: No results for input(s): COLORURINE, LABSPEC, PHURINE, GLUCOSEU, HGBUR, BILIRUBINUR, KETONESUR, PROTEINUR, UROBILINOGEN, NITRITE, LEUKOCYTESUR in the last 72 hours.  Invalid input(s): APPERANCEUR    Imaging: No results found.   Medications:    promethazine  (PHENERGAN ) injection (IM or IVPB) Stopped (03/25/24 1130)    (feeding supplement) PROSource Plus  30 mL Oral TID BM   aspirin   300 mg Rectal Daily   Or   aspirin   325 mg Oral Daily   atorvastatin   40 mg Oral Daily   carvedilol   12.5 mg Oral BID WC   Chlorhexidine  Gluconate Cloth  6 each Topical Daily   cloNIDine   0.3 mg Transdermal Weekly   diltiazem   180 mg Oral Daily   feeding supplement  1 Container Oral TID BM   losartan   100 mg Oral Daily    multivitamin  1 tablet Oral QHS   pantoprazole   40 mg Oral Daily   polyethylene glycol  17 g Oral Daily   QUEtiapine   100 mg Oral Daily   QUEtiapine   25 mg Oral TID   senna-docusate  1 tablet Oral BID   sodium chloride  flush  10-40 mL Intracatheter Q12H   spironolactone   25 mg Oral Daily   traZODone   50 mg Oral QHS    Assessment/ Plan:     68 y.o.  female  with past medical conditions including end-stage renal disease on hemodialysis, dementia, hypertension, diabetes, hyperlipidemia, cervical cancer, recurrent pancreatitis, PVD and end-stage renal disease on hemodialysis.  Patient presents to the emergency department after experiencing slurred speech, left-sided facial spasms and weakness.  Patient has been admitted for TIA (transient ischemic attack) [G45.9] Hallucination [R44.3]   [facility] [place]/MWF/Lt AVG      End-stage renal disease on hemodialysis. Had stable dialysis yesterday.  Next treatment scheduled for Monday.   Transient ischemic attack, reporting symptoms of slurred speech, involuntary left-sided facial spasms and weakness.  Neurology consulted, TIA versus partial complex seizure.  Brain imaging negative for acute infarct.  Carotid ultrasound shows right-sided 50 to 60% stenosis without plaque.  Symptoms resolved.     3.  Hypertension: hypertensive urgency on admission.  - Patient prescribed carvedilol , clonidine , diltiazem , losartan , and spironolactone . Hydralazine , labetalol  for PRN use            - Blood pressure is stable.     4. Secondary Hyperparathyroidism:             - continue sevelamer  with meals             - Calcium  acceptable however phosphorus elevated, likely due to missed dialysis treatment.  Awaiting updated phosphorus.  5. Anemia with chronic kidney disease: Mircera as outpatient. Holding ESA due to acute ischemic event.   Labs and medications reviewed. Will continue to follow along with you.   LOS: 23 Pinkey Edman, MD [facility] 7/19/20254:20 PM

## 2024-03-30 NOTE — Progress Notes (Signed)
 Progress Note   Patient: Tracy Jennings FMW:[id number] DOB: 14-Dec-1955 DOA: 03/05/2024     23 DOS: the patient was seen and examined on 03/30/2024    Brief hospital course:   JHOANNA HEYDE is a 68 year old female with ESRD on HD MWF, HTN, HLD, DM, PVD, dementia, chronic recurrent pancreatitis, stroke, cervical cancer, HIT, who presents with slurred speech, left facial spasm, left-sided weakness.  Patient had syncopal episode on 6/23 and was seen in the ED.  At that time she was found to have O2 desaturation to 80% due to pulmonary edema on chest x-ray as well as hyperkalemia with a potassium of 6.2.  She had negative head CT during that admission.  She received hemodialysis and then was discharged home.  Her last known normal was 6/24 at 1700.  Upon arrival to the ED patient's confusion had resolved entirely.  Labs in the ED mostly unremarkable, potassium 4.6, creatinine consistent with ESRD.  Blood pressure was found to be 252/79.  Patient was placed in telemetry bed for observation.  Code stroke was called.Blood pressure resolved.  Head CT was without acute intracranial abnormality.  Chronic infarct seen on right parietal lobe and extensive chronic microvascular ischemic changes with remote lacunar infarcts in the right caudate and right thalamus appreciated.  Patient was admitted for workup of presumed TIA.  LDL 96, hemoglobin A1c 5.1%.  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  MRI brain revealed chronic encephalomalacia within the right parietal lobe and advanced diffuse cerebral white matter disease.  Patient underwent carotid Dopplers which revealed elevated velocity within the distal right internal carotid artery suggesting stenosis of 50 to 69% though no significant plaque identified and thus this may be artifactual. Patient underwent hemodialysis on 6/25 but left her session early AMA.she attempted to leave the hospital AMA but during this event began  hallucinating and loss capacity.  Psychiatry was consulted and met with the patient on 6/26 and confirms acute delirium, currently the patient does not have capacity to make medical decisions.  She has been started on Seroquel  for presumed Lewy body dementia.  She has had persistent agitation and Seroquel  has been slowly increased. She is currently medically stable and undergoing routine HD while awaiting safe dispo. TOC is consulted.    BP is labile and high.  Family voiced concern about isosorbide  mononitrate.  Nephrology discontinued isosorbide  and possibly add some other medications.  Patient is currently on losartan , Coreg , clonidine , diltiazem , doxazosin  and the spironolactone .   7/6.  Case discussed with nephrology and switching clonidine  pills over to clonidine  patch.  Imdur  discontinued the day prior.  Family interested in taking patient home rather than going to rehab 7/7.  Patient seen on dialysis and blood pressure on the lower side.  Continue to monitor closely.  Patient complains of pains and was given Tylenol . 7/8.  Patient did not sleep last night.  She was able to answer questions and follow commands for me this morning.  Was sleeping during the day.     7/9 - Pt had dialysis. Otherwise uneventful day. 7/10 - new onset nausea/vomiting, leukocytosis, no tolerating PO meds, BP's uncontrolled 7/11 - BP's still significantly elevated. Pt vomited her PO meds yesterday AM.  7/12 - BP's improved with systolic's in 140's to 160's.  No N/V reported.  +Visual hallucinations, but no agitation issues reported since I assumed care. 7/13 -- BP's improved.  No N/V or complaints of stomach issues.   7/14 -- pt refused  AM dialysis and PO meds, recurrent stomach pains but no N/V. Normal lipase 7/15 -- no acute issues, ongoing abdominal pain from constipation/gas pains 7/16-7/17-patient having hallucination and psychiatry eval requested     Assessment and Plan:   Hypertensive urgency BP's  remain overall elevated, at times still up at 190's-200's systolic.  Improved with increased dose of Cardizem  7/11.  BP's labile in dialysis. Continue clonidine  patch, Coreg , Cardizem  CD, losartan , spironolactone  --Increased Cardizem  from 120 >> 180 mg --Nephrology dc'd doxazosin  -- apparently pt has had issues in the past with this medication in outpatient setting --Titrate regimen as needed   Leukocytosis - Resolved.  Monitor CBC Blood culture showing no growth to date   Nausea and Vomiting-resolved Intermittent stomach pains -  Continue simethicone  Continue anticonstipation medication   Acute delirium Family have agreed with discharge to skilled nursing facility --Continued Seroquel  25 mg TID + 75 mg at bedtime --PRN Seroquel  also available for uncontrolled agitation/anxiety. Continue to avoid sedating medication Continue delirium precaution   Dementia with behavioral disturbance (HCC) Patient on Seroquel  and as needed Haldol , per psychiatry Patient was seen by psychiatry does not have the capacity to leave AMA.  Seen by psychiatry on 03/13/24  Psychiatry reevaluation requested   ESRD (end stage renal disease) [facility]) Nephrology following.  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   Hyperlipidemia Continue Lipitor   TIA (transient ischemic attack) Ruled out.  MRI of the brain shows chronic encephalomalacia within the right parietal lobe and advanced diffuse cerebral white matter disease, carotid Doppler showing elevated velocity in the distal right internal carotid suggesting stenosis of 50 to 69%, EEG showing cortical dysfunction arising from the right temporal parietal region secondary to underlying structural abnormality no seizure or left to form discharges, EF normal range.   Hyponatremia Dialysis for volume management      Subjective: Patient seen and examined at bedside this morning Mental status improved Denies chest pain nausea vomiting abdominal pain Patient was returning home however family is wants SNF Patient has not been alerted to make her own decisions  Physical Exam:   General exam: awake, alert, no acute distress, talkative HEENT: moist mucus membranes, hearing grossly normal  Respiratory system: on room air, normal respiratory effort. Cardiovascular system: normal S1/S2, RRR, no pedal edema.   Gastrointestinal system: soft, NT, ND, no HSM felt, +bowel sounds. Central nervous system: A&O x 2. no gross focal neurologic deficits, normal speech Extremities: moves all , no edema, normal tone Psychiatry: normal mood, congruent affect    Family Communication: No family at bedside     Disposition: Status is: Inpatient Remains inpatient appropriate because: needs SNF placement      Planned Discharge Destination: SNF      Data Reviewed:   Vitals:   03/29/24 2003 03/30/24 0306 03/30/24 0733 03/30/24 1201  BP: (!) 132/53 (!) 154/54 (!) 133/49 (!) 118/45  Pulse: 68 68 74 76  Resp: 18  19 17   Temp: 98.5 F (36.9 C) 98.8 F (37.1 C) 98.9 F (37.2 C) 98.5 F (36.9 C)  TempSrc: Oral Oral  Oral  SpO2: 92% 98% 96% 98%  Weight:      Height:          Latest Ref Rng & Units 03/30/2024    8:30 AM 03/29/2024    5:20 AM 03/28/2024    4:25 AM  CBC  WBC 4.0 - 10.5 K/uL 13.9  12.8  11.5   Hemoglobin 12.0 - 15.0 g/dL 9.6  9.3  9.4   Hematocrit 36.0 - 46.0 % 30.0  29.2  29.0   Platelets 150 - 400 K/uL 273  265  231        Latest Ref Rng & Units 03/30/2024    6:20 AM 03/29/2024    5:20 AM 03/28/2024    4:25 AM  BMP  Glucose 70 - 99 mg/dL 856  829  98   BUN 8 - 23 mg/dL 37  60  48   Creatinine 0.44 - 1.00 mg/dL 4.82  1.53  3.09   Sodium 135 - 145 mmol/L 139  137  135   Potassium 3.5 - 5.1 mmol/L 4.2  4.0  3.7   Chloride 98 - 111 mmol/L 100  98  97   CO2 22 - 32 mmol/L 30  24  27     Calcium  8.9 - 10.3 mg/dL 8.5  8.3  8.3      Author: Drue ONEIDA Potter, MD 03/30/2024 1:57 PM  For on call review [email or web address].

## 2024-03-30 NOTE — Plan of Care (Signed)
  Problem: Education: Goal: Knowledge of disease or condition will improve Outcome: Progressing   Problem: Ischemic Stroke/TIA Tissue Perfusion: Goal: Complications of ischemic stroke/TIA will be minimized Outcome: Progressing   Problem: Coping: Goal: Will verbalize positive feelings about self Outcome: Progressing   Problem: Coping: Goal: Will verbalize positive feelings about self Outcome: Progressing   Problem: Education: Goal: Knowledge of General Education information will improve Description: Including pain rating scale, medication(s)/side effects and non-pharmacologic comfort measures Outcome: Progressing

## 2024-03-31 DIAGNOSIS — I160 Hypertensive urgency: Secondary | ICD-10-CM | POA: Diagnosis not present

## 2024-03-31 LAB — GLUCOSE, CAPILLARY: Glucose-Capillary: 101 mg/dL — ABNORMAL HIGH (ref 70–99)

## 2024-03-31 MED ORDER — SODIUM CHLORIDE 0.9 % IV SOLN
1.0000 g | INTRAVENOUS | Status: DC
  Administered 2024-03-31 – 2024-04-02 (×3): 1 g via INTRAVENOUS
  Filled 2024-03-31 (×3): qty 10

## 2024-03-31 NOTE — Plan of Care (Signed)
 The patient has a temp of 100.5. PRN Tylenol  650mg  PO given.

## 2024-03-31 NOTE — Progress Notes (Signed)
 Progress Note   Patient: Tracy Jennings FMW:[id number] DOB: 02-Jul-1956 DOA: 03/05/2024     24 DOS: the patient was seen and examined on 03/31/2024     Brief hospital course:   Tracy Jennings is a 68 year old female with ESRD on HD MWF, HTN, HLD, DM, PVD, dementia, chronic recurrent pancreatitis, stroke, cervical cancer, HIT, who presents with slurred speech, left facial spasm, left-sided weakness.  Patient had syncopal episode on 6/23 and was seen in the ED.  At that time she was found to have O2 desaturation to 80% due to pulmonary edema on chest x-ray as well as hyperkalemia with a potassium of 6.2.  She had negative head CT during that admission.  She received hemodialysis and then was discharged home.  Her last known normal was 6/24 at 1700.  Upon arrival to the ED patient's confusion had resolved entirely.  Labs in the ED mostly unremarkable, potassium 4.6, creatinine consistent with ESRD.  Blood pressure was found to be 252/79.  Patient was placed in telemetry bed for observation.  Code stroke was called.Blood pressure resolved.  Head CT was without acute intracranial abnormality.  Chronic infarct seen on right parietal lobe and extensive chronic microvascular ischemic changes with remote lacunar infarcts in the right caudate and right thalamus appreciated.  Patient was admitted for workup of presumed TIA.  LDL 96, hemoglobin A1c 5.1%.  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  MRI brain revealed chronic encephalomalacia within the right parietal lobe and advanced diffuse cerebral white matter disease.  Patient underwent carotid Dopplers which revealed elevated velocity within the distal right internal carotid artery suggesting stenosis of 50 to 69% though no significant plaque identified and thus this may be artifactual. Patient underwent hemodialysis on 6/25 but left her session early AMA.she attempted to leave the hospital AMA but during this event began  hallucinating and loss capacity.  Psychiatry was consulted and met with the patient on 6/26 and confirms acute delirium, currently the patient does not have capacity to make medical decisions.  She has been started on Seroquel  for presumed Lewy body dementia.  She has had persistent agitation and Seroquel  has been slowly increased. She is currently medically stable and undergoing routine HD while awaiting safe dispo. TOC is consulted.    BP is labile and high.  Family voiced concern about isosorbide  mononitrate.  Nephrology discontinued isosorbide  and possibly add some other medications.  Patient is currently on losartan , Coreg , clonidine , diltiazem , doxazosin  and the spironolactone .   7/6.  Case discussed with nephrology and switching clonidine  pills over to clonidine  patch.  Imdur  discontinued the day prior.  Family interested in taking patient home rather than going to rehab 7/7.  Patient seen on dialysis and blood pressure on the lower side.  Continue to monitor closely.  Patient complains of pains and was given Tylenol . 7/8.  Patient did not sleep last night.  She was able to answer questions and follow commands for me this morning.  Was sleeping during the day.     7/9 - Pt had dialysis. Otherwise uneventful day. 7/10 - new onset nausea/vomiting, leukocytosis, no tolerating PO meds, BP's uncontrolled 7/11 - BP's still significantly elevated. Pt vomited her PO meds yesterday AM.  7/12 - BP's improved with systolic's in 140's to 160's.  No N/V reported.  +Visual hallucinations, but no agitation issues reported since I assumed care. 7/13 -- BP's improved.  No N/V or complaints of stomach issues.   7/14 -- pt  refused AM dialysis and PO meds, recurrent stomach pains but no N/V. Normal lipase 7/15 -- no acute issues, ongoing abdominal pain from constipation/gas pains 7/16-7/17-patient having hallucination and psychiatry eval requested     Assessment and Plan:   Hypertensive urgency BP's  remain overall elevated, at times still up at 190's-200's systolic.  Improved with increased dose of Cardizem  7/11.  BP's labile in dialysis. Continue clonidine  patch, Coreg , Cardizem  CD, losartan , spironolactone  --Increased Cardizem  from 120 >> 180 mg --Nephrology dc'd doxazosin  -- apparently pt has had issues in the past with this medication in outpatient setting --Titrate regimen as needed   Leukocytosis -possibly due to urinary tract infection Monitor CBC Blood culture showing no growth to date Patient initiated on empiric ceftriaxone  for possible UTI   Nausea and Vomiting-resolved Intermittent stomach pains -  Continue simethicone  Continue anticonstipation medication   Acute delirium Family have agreed with discharge to skilled nursing facility --Continued Seroquel  25 mg TID + 75 mg at bedtime --PRN Seroquel  also available for uncontrolled agitation/anxiety. Continue to avoid sedating medication Continue delirium precaution   Dementia with behavioral disturbance (HCC) Patient on Seroquel  and as needed Haldol , per psychiatry Patient was seen by psychiatry does not have the capacity to leave AMA.  Seen by psychiatry on 03/13/24  Psychiatry reevaluation requested   ESRD (end stage renal disease) [facility]) Nephrology following.  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   Hyperlipidemia Continue Lipitor   TIA (transient ischemic attack) Ruled out.  MRI of the brain shows chronic encephalomalacia within the right parietal lobe and advanced diffuse cerebral white matter disease, carotid Doppler showing elevated velocity in the distal right internal carotid suggesting stenosis of 50 to 69%, EEG showing cortical dysfunction arising from the right temporal parietal region secondary to underlying structural abnormality no seizure or left to form  discharges, EF normal range.   Hyponatremia Dialysis for volume management     Subjective: Patient seen and examined at bedside this morning Patient spiked temperature up to 100.5 with leukocytosis Unable to obtain urine sample given ESRD Chest x-ray is clear Given altered mentation we will cover empirically for UTI   Physical Exam:   General exam: awake, alert, no acute distress, talkative HEENT: moist mucus membranes, hearing grossly normal  Respiratory system: on room air, normal respiratory effort. Cardiovascular system: normal S1/S2, RRR, no pedal edema.   Gastrointestinal system: soft, NT, ND, no HSM felt, +bowel sounds. Central nervous system: A&O x 2. no gross focal neurologic deficits, normal speech Extremities: moves all , no edema, normal tone Psychiatry: normal mood, congruent affect    Family Communication: No family at bedside     Disposition: Status is: Inpatient Remains inpatient appropriate because: needs SNF placement      Planned Discharge Destination: SNF      Data Reviewed:    Vitals:   03/31/24 0111 03/31/24 0112 03/31/24 0355 03/31/24 0738  BP: 133/66  (!) 160/56 (!) 151/59  Pulse: 81 80 81 78  Resp: 18  17 16   Temp: 98.1 F (36.7 C)  (!) 100.5 F (38.1 C) 99.3 F (37.4 C)  TempSrc:      SpO2:  98% 95% 100%  Weight:      Height:          Latest Ref Rng & Units 03/30/2024    8:30 AM 03/29/2024    5:20  AM 03/28/2024    4:25 AM  CBC  WBC 4.0 - 10.5 K/uL 13.9  12.8  11.5   Hemoglobin 12.0 - 15.0 g/dL 9.6  9.3  9.4   Hematocrit 36.0 - 46.0 % 30.0  29.2  29.0   Platelets 150 - 400 K/uL 273  265  231        Latest Ref Rng & Units 03/30/2024    6:20 AM 03/29/2024    5:20 AM 03/28/2024    4:25 AM  BMP  Glucose 70 - 99 mg/dL 856  829  98   BUN 8 - 23 mg/dL 37  60  48   Creatinine 0.44 - 1.00 mg/dL 4.82  1.53  3.09   Sodium 135 - 145 mmol/L 139  137  135   Potassium 3.5 - 5.1 mmol/L 4.2  4.0  3.7   Chloride 98 - 111 mmol/L 100  98  97    CO2 22 - 32 mmol/L 30  24  27    Calcium  8.9 - 10.3 mg/dL 8.5  8.3  8.3      Author: Drue ONEIDA Potter, MD 03/31/2024 2:33 PM  For on call review [email or web address].

## 2024-03-31 NOTE — Progress Notes (Signed)
 [facility]  PROGRESS NOTE   Subjective:   Patient seen at bedside.  Comfortable.  Objective:  Vital signs: Blood pressure (!) 151/59, pulse 78, temperature 99.3 F (37.4 C), resp. rate 16, height 5' 3 (1.6 m), weight 52 kg, SpO2 100%. No intake or output data in the 24 hours ending 03/31/24 1239 Filed Weights   03/27/24 1218 03/29/24 0746 03/29/24 1140  Weight: 42.3 kg 40.1 kg 52 kg     Physical Exam: General:  No acute distress  Head:  Normocephalic, atraumatic. Moist oral mucosal membranes  Eyes:  Anicteric  Neck:  Supple  Lungs:   Clear to auscultation, normal effort  Heart:  S1S2 no rubs  Abdomen:   Soft, nontender, bowel sounds present  Extremities:  peripheral edema.  Neurologic:  Awake, alert, following commands  Skin:  No lesions  Access:     Basic Metabolic Panel: Recent Labs  Lab 03/25/24 0616 03/27/24 0625 03/28/24 0425 03/29/24 0500 03/29/24 0520 03/30/24 0620  NA 133* 130* 135  --  137 139  K 4.1 4.3 3.7  --  4.0 4.2  CL 92* 89* 97*  --  98 100  CO2 25 23 27   --  24 30  GLUCOSE 108* 90 98  --  170* 143*  BUN 72* 98* 48*  --  60* 37*  CREATININE 8.94* 12.37* 6.90*  --  8.46* 5.17*  CALCIUM  9.1 8.5* 8.3*  --  8.3* 8.5*  PHOS 4.9* 6.5*  --  3.4  --   --    GFR: Estimated Creatinine Clearance: 8.8 mL/min (A) (by C-G formula based on SCr of 5.17 mg/dL (H)).  Liver Function Tests: Recent Labs  Lab 03/25/24 0616 03/25/24 1531 03/27/24 0625  AST  --  13*  --   ALT  --  9  --   ALKPHOS  --  36*  --   BILITOT  --  0.5  --   PROT  --  6.8  --   ALBUMIN 3.4* 3.2* 2.8*   Recent Labs  Lab 03/25/24 1531  LIPASE 30   No results for input(s): AMMONIA in the last 168 hours.  CBC: Recent Labs  Lab 03/27/24 0625 03/28/24 0425 03/29/24 0520 03/30/24 0830  WBC 13.2* 11.5* 12.8* 13.9*  NEUTROABS 11.6* 9.7* 11.2* 12.4*  HGB 10.0* 9.4* 9.3* 9.6*  HCT 30.3* 29.0* 29.2* 30.0*  MCV 92.4 92.1 93.9 94.6  PLT 205 231 265 273      HbA1C: Hemoglobin A1C  Date/Time Value Ref Range Status  06/13/2014 05:34 AM 11.2 (H) 4.2 - 6.3 % Final    Comment:    The American Diabetes Association recommends that a primary goal of therapy should be <7% and that physicians should reevaluate the treatment regimen in patients with HbA1c values consistently >8%.    Hgb A1c MFr Bld  Date/Time Value Ref Range Status  03/05/2024 06:10 PM 5.1 4.8 - 5.6 % Final    Comment:    (NOTE) Diagnosis of Diabetes The following HbA1c ranges recommended by the American Diabetes Association (ADA) may be used as an aid in the diagnosis of diabetes mellitus.  Hemoglobin             Suggested A1C NGSP%              Diagnosis  <5.7                   Non Diabetic  5.7-6.4  Pre-Diabetic  >6.4                   Diabetic  <7.0                   Glycemic control for                       adults with diabetes.    11/03/2019 05:36 PM 6.3 (H) 4.8 - 5.6 % Final    Comment:    (NOTE) Pre diabetes:          5.7%-6.4% Diabetes:              >6.4% Glycemic control for   <7.0% adults with diabetes     Urinalysis: No results for input(s): COLORURINE, LABSPEC, PHURINE, GLUCOSEU, HGBUR, BILIRUBINUR, KETONESUR, PROTEINUR, UROBILINOGEN, NITRITE, LEUKOCYTESUR in the last 72 hours.  Invalid input(s): APPERANCEUR    Imaging: No results found.   Medications:    cefTRIAXone  (ROCEPHIN )  IV 1 g (03/31/24 1015)   promethazine  (PHENERGAN ) injection (IM or IVPB) Stopped (03/25/24 1130)    (feeding supplement) PROSource Plus  30 mL Oral TID BM   aspirin   300 mg Rectal Daily   Or   aspirin   325 mg Oral Daily   atorvastatin   40 mg Oral Daily   carvedilol   12.5 mg Oral BID WC   Chlorhexidine  Gluconate Cloth  6 each Topical Daily   cloNIDine   0.3 mg Transdermal Weekly   diltiazem   180 mg Oral Daily   feeding supplement  1 Container Oral TID BM   losartan   100 mg Oral Daily   multivitamin  1 tablet Oral  QHS   pantoprazole   40 mg Oral Daily   polyethylene glycol  17 g Oral Daily   QUEtiapine   100 mg Oral Daily   QUEtiapine   25 mg Oral TID   senna-docusate  1 tablet Oral BID   sodium chloride  flush  10-40 mL Intracatheter Q12H   spironolactone   25 mg Oral Daily   traZODone   50 mg Oral QHS    Assessment/ Plan:     68 y.o. female  with past medical conditions including end-stage renal disease on hemodialysis, dementia, hypertension, diabetes, hyperlipidemia, cervical cancer, recurrent pancreatitis, PVD and end-stage renal disease on hemodialysis.  Patient presented to the emergency department after experiencing slurred speech, left-sided facial spasms and weakness.     End-stage renal disease on hemodialysis. Had stable dialysis Friday.  Next treatment scheduled for Monday.   Transient ischemic attack, reporting symptoms of slurred speech, involuntary left-sided facial spasms and weakness.  Neurology consulted, TIA versus partial complex seizure.  Brain imaging negative for acute infarct.  Carotid ultrasound shows right-sided 50 to 60% stenosis without plaque. Symptoms resolved.     3.  Hypertension: hypertensive urgency on admission.  - Patient prescribed carvedilol , clonidine , diltiazem , losartan , and spironolactone . Hydralazine , labetalol  for PRN use            - Blood pressure is stable.     4. Secondary Hyperparathyroidism:             - continue sevelamer  with meals             - Calcium  acceptable however phosphorus elevated, likely due to missed dialysis treatment.    5. Anemia with chronic kidney disease: Mircera as outpatient. Holding ESA due to acute ischemic event.     Labs and medications reviewed. Will continue to follow along with you.  LOS: 24 Pinkey Edman, MD [facility] 7/20/202512:39 PM

## 2024-03-31 NOTE — Plan of Care (Signed)
   Problem: Education: Goal: Knowledge of disease or condition will improve Outcome: Progressing Goal: Knowledge of secondary prevention will improve (MUST DOCUMENT ALL) Outcome: Progressing Goal: Knowledge of patient specific risk factors will improve (DELETE if not current risk factor) Outcome: Progressing   Problem: Ischemic Stroke/TIA Tissue Perfusion: Goal: Complications of ischemic stroke/TIA will be minimized Outcome: Progressing   Problem: Coping: Goal: Will verbalize positive feelings about self Outcome: Progressing Goal: Will identify appropriate support needs Outcome: Progressing   Problem: Health Behavior/Discharge Planning: Goal: Ability to manage health-related needs will improve Outcome: Progressing Goal: Goals will be collaboratively established with patient/family Outcome: Progressing

## 2024-03-31 NOTE — Progress Notes (Signed)
 Patient unable to swallow pills with water . Tried giving patient a sip of water  with one capsule. Patient coughed and unable to swallow without coughing. Speech evaluation ordered. MD notified.

## 2024-03-31 NOTE — Consult Note (Signed)
 [place] [facility] Consult Follow up  Patient Name: .WRENN WILLCOX  MRN: [id number]  DOB: Feb 06, 1956  Consult Order details:  Orders (From admission, onward)     Start     Ordered   03/07/24 0935  IP CONSULT TO PSYCHIATRY       Ordering Provider: Leesa Kast, DO  Provider:  (Not yet assigned)  Question Answer Comment  Location [facility]   Reason for Consult? Capacity eval to leave [facility]      03/07/24 0934             Mode of Visit: In person    Psychiatry Consult Evaluation  Service Date: April 01, 2024 LOS:  LOS: 25 days  Chief Complaint Capacity eval  Primary Psychiatric Diagnoses  Delirium due to multiple etiologies- uremia 2.   3.    Assessment  Saia D Wamble is a 68 y.o. female admitted: Medicallyfor 03/05/2024  5:50 PM  with ESRD on HD MWF, HTN, HLD, DM, PVD, dementia, chronic recurrent pancreatitis, stroke, cervical cancer, HIT, who presents with slurred speech, left facial spasm, left-sided weakness.  Patient had syncopal episode on 6/23 and was seen in the ED.  At that time she was found to have O2 desaturation to 80% due to pulmonary edema on chest x-ray as well as hyperkalemia with a potassium of 6.2.  She had negative head CT during that admission.  She received hemodialysis and then was discharged home.  Her last known normal was 6/24 at 1700.  Upon arrival to the ED patient's confusion had resolved entirely.  Labs in the ED mostly unremarkable, potassium 4.6, creatinine consistent with ESRD.  Blood pressure was found to be 252/79.  Patient was placed in telemetry bed for observation.  Code stroke was called.Blood pressure resolved.  Head CT was without acute intracranial abnormality.  Chronic infarct seen on right parietal lobe and extensive chronic microvascular ischemic changes with remote lacunar infarcts in the right caudate and right thalamus appreciated.  Patient was admitted for workup of presumed TIA.  LDL 96, hemoglobin A1c  5.1%.  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  MRI brain revealed chronic encephalomalacia within the right parietal lobe and advanced diffuse cerebral white matter disease.  Patient underwent carotid Dopplers which revealed elevated velocity within the distal right internal carotid artery suggesting stenosis of 50 to 69% though no significant plaque identified and thus this may be artifactual. Patient underwent hemodialysis on 6/25 but left her session early AMA.  When she returned to the room she reported that she wanted to leave AGAINST MEDICAL ADVICE. Family expressed significant concerns about patient signing AMA and patient reportedly couldn't even sign the paper properly. Psychiatry is consulted to evaluate for capacity due to concerns of delirium.   03/31/24: On assessment patient is noted to be sleepy but is able to wake up and talk to the provider.  She is able to recognize her brother.  She offers no complaints and denies auditory/visual hallucinations.  She is not responding to internal stimuli today.  Patient's visual hallucinations is in the context of delirium due to ongoing medical complications with uremia and high blood pressure.  Psychiatry will continue to follow up till her discharge.  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   If patient continues to have ongoing visual hallucinations with Seroquel  will consider Zyprexa at night to see if it makes any difference.  ## Medical Decision Making Capacity: Patient's decision of leaving the hospital not completing the treatment is in the context of psychosis, visual hallucinations due to delirium.  Given the psychosis, altered mental status patient is unable to make a reasonable decision at this time.  It is our clinical opinion that patient lacks capacity as of now to make the decision of leaving AMA.  Will recommend to reach out to family members for surrogate decision making.  ## Further Work-up:  -- Patient is seen and followed by neurology  -   ## Disposition:-- There are no psychiatric contraindications to discharge at this time  ## Behavioral / Environmental: -Delirium Precautions: Delirium Interventions for Nursing and Staff: - RN to open blinds every AM. - To Bedside: Glasses, hearing aide, and pt's own shoes. Make available to patients. when possible and encourage use. - Encourage po fluids when appropriate, keep fluids within reach. - OOB to chair with meals. - Passive ROM exercises to all extremities with AM & PM care. - RN to assess orientation to person, time and place QAM and PRN. - Recommend extended visitation hours with familiar family/friends as feasible. - Staff to minimize disturbances at night. Turn off television when pt asleep or when not in use.    ## Safety and Observation Level:  - Based on my clinical evaluation, I estimate the patient to be at low risk of self harm in the current setting. - At this time, we recommend  routine. This decision is based on my review of the chart including patient's history and current presentation, interview of the patient, mental status examination, and consideration of suicide risk including evaluating suicidal ideation, plan, intent, suicidal or self-harm behaviors, risk factors, and  protective factors. This judgment is based on our ability to directly address suicide risk, implement suicide prevention strategies, and develop a safety plan while the patient is in the clinical setting. Please contact our team if there is a concern that risk level has changed.  CSSR Risk Category:C-SSRS RISK CATEGORY: No Risk  Suicide Risk Assessment: Patient has following modifiable risk factors for suicide: None identified at this time Patient has following non-modifiable or demographic risk factors for suicide: None identified at this time Patient has the following protective factors against suicide: Supportive family and Cultural, spiritual, or religious beliefs that discourage suicide  Thank you for this consult request. Recommendations have been communicated to the primary team.  We will continue to follow-up at this time.   Allyn Foil, MD       History of Present Illness  Deneka Greenwalt Mihok is a 68 y.o. female admitted: Medicallyfor 03/05/2024  5:50 PM  with ESRD on HD MWF, HTN, HLD, DM, PVD, dementia, chronic recurrent pancreatitis, stroke, cervical cancer, HIT, who presents with slurred speech, left facial spasm, left-sided weakness.  Patient had syncopal episode on 6/23 and was seen in the ED.  At  that time she was found to have O2 desaturation to 80% due to pulmonary edema on chest x-ray as well as hyperkalemia with a potassium of 6.2.  She had negative head CT during that admission.  She received hemodialysis and then was discharged home.  Her last known normal was 6/24 at 1700.  Upon arrival to the ED patient's confusion had resolved entirely.  Labs in the ED mostly unremarkable, potassium 4.6, creatinine consistent with ESRD.  Blood pressure was found to be 252/79.  Patient was placed in telemetry bed for observation.  Code stroke was called.Blood pressure resolved.  Head CT was without acute intracranial abnormality.  Chronic infarct seen on right parietal lobe and extensive chronic  microvascular ischemic changes with remote lacunar infarcts in the right caudate and right thalamus appreciated.  Patient was admitted for workup of presumed TIA.  LDL 96, hemoglobin A1c 5.1%.  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  MRI brain revealed chronic encephalomalacia within the right parietal lobe and advanced diffuse cerebral white matter disease.  Patient underwent carotid Dopplers which revealed elevated velocity within the distal right internal carotid artery suggesting stenosis of 50 to 69% though no significant plaque identified and thus this may be artifactual. Patient underwent hemodialysis on 6/25 but left her session early AMA.  When she returned to the room she reported that she wanted to leave AGAINST MEDICAL ADVICE. Family expressed significant concerns about patient signing AMA and patient reportedly couldn't even sign the paper properly. Psychiatry is consulted to evaluate for capacity due to concerns of delirium. 03/31/24: Patient is noted to be resting in bed and her brother was bedside.  She offers no complaints.  She is able to acknowledge that she is in the hospital but is unable to give any details about date and month.  She denies hearing any voices or seeing too many people in the room.  She is not responding to internal stimuli.  She denies SI/HI/plan.  Psychiatric and Social History  Psychiatric History:  Information collected from patient  Prev Dx/Sx: denies Current Psych Provider: none reported Home Meds (current): none reported Previous Med Trials: denies Therapy: denies  Prior Psych Hospitalization: denies  Prior Self Harm: denies Prior Violence: denies  Family Psych History: denies Family Hx suicide: denies  Social History:   Educational Hx: HS Occupational Hx: disability Legal Hx: denies Living Situation: lives by herself but has family support Spiritual Hx: denies Access to weapons/lethal means: denies   Substance  History Alcohol: denies  Tobacco: denies Illicit drugs: denies Prescription drug abuse: denies Rehab hx: denies  Exam Findings  Physical Exam: Reviewed and agree with the physical exam findings Vital Signs:  Temp:  [97.5 F (36.4 C)-100.5 F (38.1 C)] 97.5 F (36.4 C) (07/20 1941) Pulse Rate:  [76-81] 76 (07/20 1941) Resp:  [16-20] 20 (07/20 1941) BP: (123-160)/(55-72) 141/55 (07/20 1941) SpO2:  [95 %-100 %] 98 % (07/20 1941) Blood pressure (!) 141/55, pulse 76, temperature (!) 97.5 F (36.4 C), resp. rate 20, height 5' 3 (1.6 m), weight 52 kg, SpO2 98%. Body mass index is 20.31 kg/m.    Mental Status Exam: General Appearance: Casual  Orientation:  Other:  to self,month, year  Memory:  Immediate;   Fair Recent;   Poor Remote;   Poor  Concentration:  Concentration: Poor and Attention Span: Poor  Recall:  Fair  Attention  Poor  Eye Contact:  Minimal  Speech:  Normal Rate  Language:  Fair  Volume:  Normal  Mood: fine  Affect:  Appropriate  Thought Process:  Disorganized  Thought Content:  coherent  Suicidal Thoughts:  No  Homicidal Thoughts:  No  Judgement:  Impaired  Insight:  Shallow  Psychomotor Activity:  Normal  Akathisia:  No  Fund of Knowledge:  Fair      Assets:  Communication Skills Desire for Improvement Housing Resilience  Cognition:  Impaired,  Mild  ADL's:  impaired  AIMS (if indicated):        Other History   These have been pulled in through the EMR, reviewed, and updated if appropriate.  Family History:  The patient's family history includes Cancer in her father; Diabetes in her maternal grandfather, maternal grandmother, paternal grandfather, paternal grandmother, and sister; Gout in her mother; Hypertension in her mother; Stroke in her mother.  Medical History: Past Medical History:  Diagnosis Date   Anemia 04/2018   low iron. to be started on supplements   Cervical cancer (HCC)    CKD (chronic kidney disease)    Stage IV    Complication of anesthesia    receceived too much anesthesia, that she was in coma for a couple days    COVID-19 virus detected 10/28/2019   Diabetes mellitus without complication (HCC)    type II   ESRD (end stage renal disease) (HCC)    Heart murmur    followed as a child only   HSIL (high grade squamous intraepithelial lesion) on Pap smear of cervix    Hyperlipidemia associated with type 2 diabetes mellitus (HCC)    Hypertension    Pancreatitis    Peripheral vascular disease (HCC)     Surgical History: Past Surgical History:  Procedure Laterality Date   AMPUTATION TOE Left 2013   2nd toe. tip of toe (toe nail was infected)   AV FISTULA PLACEMENT Left 05/11/2018   Procedure: ARTERIOVENOUS (AV) FISTULA CREATION;  Surgeon: Jama Cordella MATSU, MD;  Location: [facility];  Service: Vascular;  Laterality: Left;   CATARACT EXTRACTION     CERVICAL CONIZATION W/BX N/A 04/08/2020   Procedure: CONIZATION CERVIX WITH BIOPSY;  Surgeon: Mancil Barter, MD;  Location: [facility];  Service: Gynecology;  Laterality: N/A;   CHOLECYSTECTOMY  2014   COLONOSCOPY     COLONOSCOPY WITH PROPOFOL  N/A 01/10/2018   Procedure: COLONOSCOPY WITH PROPOFOL ;  Surgeon: Toledo, Ladell POUR, MD;  Location: [facility];  Service: Gastroenterology;  Laterality: N/A;   DIALYSIS/PERMA CATHETER INSERTION N/A 05/21/2018   Procedure: DIALYSIS/PERMA CATHETER INSERTION;  Surgeon: Marea Selinda RAMAN, MD;  Location: [facility];  Service: Cardiovascular;  Laterality: N/A;   DIALYSIS/PERMA CATHETER INSERTION N/A 07/14/2020   Procedure: DIALYSIS/PERMA CATHETER INSERTION;  Surgeon: Marea Selinda RAMAN, MD;  Location: [facility];  Service: Cardiovascular;  Laterality: N/A;   DIALYSIS/PERMA CATHETER REMOVAL N/A 04/11/2019   Procedure: DIALYSIS/PERMA CATHETER REMOVAL;  Surgeon: Marea Selinda RAMAN, MD;  Location: [facility];  Service: Cardiovascular;  Laterality: N/A;   EYE SURGERY Bilateral 2018   cataract extractions   IR  IMAGING GUIDED PORT INSERTION  06/05/2020   THROMBECTOMY W/ EMBOLECTOMY  05/11/2018   Procedure: THROMBECTOMY ARTERIOVENOUS FISTULA;  Surgeon: Jama Cordella MATSU, MD;  Location: [facility];  Service: Vascular;;   TUBAL LIGATION  1984     Medications:   Current Facility-Administered Medications:    (feeding supplement) PROSource Plus liquid 30 mL, 30 mL, Oral, TID BM, Djan, Prince T, MD, 30 mL at 03/31/24 1439   acetaminophen  (TYLENOL )  tablet 650 mg, 650 mg, Oral, Q4H PRN, 650 mg at 03/31/24 0418 **OR** acetaminophen  (TYLENOL ) 160 MG/5ML solution 650 mg, 650 mg, Per Tube, Q4H PRN **OR** acetaminophen  (TYLENOL ) suppository 650 mg, 650 mg, Rectal, Q4H PRN, Niu, Xilin, MD   aspirin  suppository 300 mg, 300 mg, Rectal, Daily **OR** aspirin  tablet 325 mg, 325 mg, Oral, Daily, Niu, Xilin, MD, 325 mg at 03/30/24 0850   atorvastatin  (LIPITOR) tablet 40 mg, 40 mg, Oral, Daily, Niu, Xilin, MD, 40 mg at 03/30/24 0849   bisacodyl  (DULCOLAX) EC tablet 5 mg, 5 mg, Oral, Daily PRN, Fausto Sor A, DO, 5 mg at 03/23/24 1425   calcium  carbonate (TUMS - dosed in mg elemental calcium ) chewable tablet 400 mg of elemental calcium , 2 tablet, Oral, BID PRN, Fausto Sor A, DO, 400 mg of elemental calcium  at 03/31/24 1740   carvedilol  (COREG ) tablet 12.5 mg, 12.5 mg, Oral, BID WC, Singh, Harmeet, MD, 12.5 mg at 03/31/24 1738   cefTRIAXone  (ROCEPHIN ) 1 g in sodium chloride  0.9 % 100 mL IVPB, 1 g, Intravenous, Q24H, Djan, Prince T, MD, Last Rate: 200 mL/hr at 03/31/24 1015, 1 g at 03/31/24 1015   Chlorhexidine  Gluconate Cloth 2 % PADS 6 each, 6 each, Topical, Daily, Dezii, Alexandra, DO, 6 each at 03/31/24 9166   cloNIDine  (CATAPRES  - Dosed in mg/24 hr) patch 0.3 mg, 0.3 mg, Transdermal, Weekly, Kolluru, Sarath, MD, 0.3 mg at 03/31/24 1441   diltiazem  (CARDIZEM  CD) 24 hr capsule 180 mg, 180 mg, Oral, Daily, Fausto Sor A, DO, 180 mg at 03/31/24 9170   feeding supplement (BOOST / RESOURCE BREEZE) liquid 1 Container, 1  Container, Oral, TID BM, Djan, Prince T, MD, 1 Container at 03/31/24 1439   haloperidol  lactate (HALDOL ) injection 1-2 mg, 1-2 mg, Intramuscular, Q6H PRN, Mansy, Jan A, MD, 2 mg at 03/18/24 0055   hydrALAZINE  (APRESOLINE ) injection 10 mg, 10 mg, Intravenous, Q6H PRN, Mansy, Jan A, MD, 10 mg at 03/25/24 1619   hydrALAZINE  (APRESOLINE ) tablet 25 mg, 25 mg, Oral, Q6H PRN, Elgergawy, Dawood S, MD, 25 mg at 03/19/24 2105   labetalol  (NORMODYNE ) injection 20 mg, 20 mg, Intravenous, Q3H PRN, Mansy, Jan A, MD, 20 mg at 03/21/24 9344   LORazepam  (ATIVAN ) injection 2 mg, 2 mg, Intravenous, Q2H PRN, Niu, Xilin, MD, 2 mg at 03/16/24 0502   losartan  (COZAAR ) tablet 100 mg, 100 mg, Oral, Daily, Breeze, Shantelle, NP, 100 mg at 03/31/24 1740   morphine  (PF) 2 MG/ML injection 2 mg, 2 mg, Intravenous, Q4H PRN, Dorinda Homans T, MD, 2 mg at 03/30/24 1630   multivitamin (RENA-VIT) tablet 1 tablet, 1 tablet, Oral, QHS, Djan, Prince T, MD, 1 tablet at 03/28/24 2109   ondansetron  (ZOFRAN ) injection 4 mg, 4 mg, Intravenous, Q8H PRN, Niu, Xilin, MD, 4 mg at 03/25/24 1809   pantoprazole  (PROTONIX ) EC tablet 40 mg, 40 mg, Oral, Daily, Elesa Perkins, RPH, 40 mg at 03/31/24 1739   polyethylene glycol (MIRALAX  / GLYCOLAX ) packet 17 g, 17 g, Oral, Daily, Wieting, Richard, MD, 17 g at 03/29/24 1405   promethazine  (PHENERGAN ) 12.5 mg in sodium chloride  0.9 % 50 mL IVPB, 12.5 mg, Intravenous, Q6H PRN, Mansy, Madison LABOR, MD, Stopped at 03/25/24 1130   QUEtiapine  (SEROQUEL ) tablet 100 mg, 100 mg, Oral, Daily, Michela Herst, MD, 100 mg at 03/30/24 2010   QUEtiapine  (SEROQUEL ) tablet 25 mg, 25 mg, Oral, TID, Carra Brindley, MD, 25 mg at 03/31/24 1739   QUEtiapine  (SEROQUEL ) tablet 25 mg, 25 mg, Oral, Q8H PRN, Conrad Zajkowski,  Mansel Strother, MD, 25 mg at 03/14/24 2357   senna-docusate (Senokot-S) tablet 1 tablet, 1 tablet, Oral, BID, Fausto Burnard LABOR, DO, 1 tablet at 03/29/24 1406   sodium chloride  flush (NS) 0.9 % injection 10-40 mL, 10-40 mL,  Intracatheter, PRN, Dezii, Alexandra, DO, 10 mL at 03/10/24 0830   sodium chloride  flush (NS) 0.9 % injection 10-40 mL, 10-40 mL, Intracatheter, Q12H, Paudel, Keshab, MD, 10 mL at 03/31/24 2207   sodium chloride  flush (NS) 0.9 % injection 10-40 mL, 10-40 mL, Intracatheter, PRN, Paudel, Keshab, MD   spironolactone  (ALDACTONE ) tablet 25 mg, 25 mg, Oral, Daily, Lenon Pons L, MD, 25 mg at 03/31/24 0830   traZODone  (DESYREL ) tablet 50 mg, 50 mg, Oral, QHS, Dennise Capri, MD, 50 mg at 03/29/24 2035  Allergies: Allergies  Allergen Reactions   Amlodipine  Swelling    Knees down to ankles Knees down to ankles   Hctz [Hydrochlorothiazide]     pancreatitis   Heparin  Other (See Comments)    Hx  HIT,   Pt reports cardiac arrest when given heparin    Lmw Heparin      Reports cardiac arrest when given heparin    Other Other (See Comments)    Reports cardiac arrest when given during surgery Reports cardiac arrest when given during surgery Reports cardiac arrest when given during surgery    Sulfa Antibiotics Rash   Lactose    Dairy Aid [Tilactase]     Runny nose   Hydralazine      Nausea and rash    Allyn Foil, MD

## 2024-03-31 NOTE — Evaluation (Signed)
 Clinical/Bedside Swallow Evaluation Patient Details  Name: Tracy Jennings MRN: [id number] Date of Birth: 1956-03-02  Today's Date: 03/31/2024 Time: SLP Start Time (ACUTE ONLY): 0940 SLP Stop Time (ACUTE ONLY): 0955 SLP Time Calculation (min) (ACUTE ONLY): 15 min  Past Medical History:  Past Medical History:  Diagnosis Date   Anemia 04/2018   low iron. to be started on supplements   Cervical cancer (HCC)    CKD (chronic kidney disease)    Stage IV   Complication of anesthesia    receceived too much anesthesia, that she was in coma for a couple days    COVID-19 virus detected 10/28/2019   Diabetes mellitus without complication (HCC)    type II   ESRD (end stage renal disease) (HCC)    Heart murmur    followed as a child only   HSIL (high grade squamous intraepithelial lesion) on Pap smear of cervix    Hyperlipidemia associated with type 2 diabetes mellitus (HCC)    Hypertension    Pancreatitis    Peripheral vascular disease (HCC)    Past Surgical History:  Past Surgical History:  Procedure Laterality Date   AMPUTATION TOE Left 2013   2nd toe. tip of toe (toe nail was infected)   AV FISTULA PLACEMENT Left 05/11/2018   Procedure: ARTERIOVENOUS (AV) FISTULA CREATION;  Surgeon: Jama Cordella MATSU, MD;  Location: [facility];  Service: Vascular;  Laterality: Left;   CATARACT EXTRACTION     CERVICAL CONIZATION W/BX N/A 04/08/2020   Procedure: CONIZATION CERVIX WITH BIOPSY;  Surgeon: Mancil Barter, MD;  Location: [facility];  Service: Gynecology;  Laterality: N/A;   CHOLECYSTECTOMY  2014   COLONOSCOPY     COLONOSCOPY WITH PROPOFOL  N/A 01/10/2018   Procedure: COLONOSCOPY WITH PROPOFOL ;  Surgeon: Toledo, Ladell POUR, MD;  Location: [facility];  Service: Gastroenterology;  Laterality: N/A;   DIALYSIS/PERMA CATHETER INSERTION N/A 05/21/2018   Procedure: DIALYSIS/PERMA CATHETER INSERTION;  Surgeon: Marea Selinda RAMAN, MD;  Location: [facility];  Service: Cardiovascular;  Laterality:  N/A;   DIALYSIS/PERMA CATHETER INSERTION N/A 07/14/2020   Procedure: DIALYSIS/PERMA CATHETER INSERTION;  Surgeon: Marea Selinda RAMAN, MD;  Location: [facility];  Service: Cardiovascular;  Laterality: N/A;   DIALYSIS/PERMA CATHETER REMOVAL N/A 04/11/2019   Procedure: DIALYSIS/PERMA CATHETER REMOVAL;  Surgeon: Marea Selinda RAMAN, MD;  Location: [facility];  Service: Cardiovascular;  Laterality: N/A;   EYE SURGERY Bilateral 2018   cataract extractions   IR IMAGING GUIDED PORT INSERTION  06/05/2020   THROMBECTOMY W/ EMBOLECTOMY  05/11/2018   Procedure: THROMBECTOMY ARTERIOVENOUS FISTULA;  Surgeon: Jama Cordella MATSU, MD;  Location: [facility];  Service: Vascular;;   TUBAL LIGATION  1984   HPI:  Pt is a 68 year old female presents with slurred speech,  left facial spasm, left sided weakness.    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 Evaluated by SLP on 03/27/24 with recommendation for regular/thin and meds crushed in vanilla pudding. SLP reconsulted as nsg reported coughing when meds given with water . Also reported difficulty with swallowing water .    Assessment / Plan / Recommendation  Clinical Impression   Patient function appears commensurate with assessment on 03/27/24. Patient continues to require encouragement to accept POs, and when she does accept solids, on takes a very small amount (1/2 tsp or less). Oral holding continues for thin liquids and for solids, with verbal encouragement and small amount  of puree to facilitate transfer of solid, she initiates oral transit. Pharyngeal swallow appears WFL. Crushed meds are recommended due to cognitive impairment and oral holding. Will message MD to request any uncrushable meds to be switched to liquid form or IV. Pt remains at mild-moderate risk for aspiration, requires cues, set up, and may need assistance with feeding due  to AMS/ cognitive deficits. Sister reports pt swallows when she wants to. Feed and give meds only when alert and accepting POs, continue to follow aspiration precautions as posted at [facility]: Fully upright, small bites/sips, slow rate, alternate solids/liquids, clear mouth between bites, reduce distractions at mealtimes. Straws OK. SLP to s/o.     SLP Visit Diagnosis: Dysphagia, oral phase (R13.11)    Aspiration Risk  Risk for inadequate nutrition/hydration;Mild aspiration risk;Moderate aspiration risk    Diet Recommendation Regular;Thin liquid    Liquid Administration via: Spoon;Cup;Straw Medication Administration: Crushed with puree (prefers vanilla yogurt or pudding) Supervision: Patient able to self feed;Full supervision/cueing for compensatory strategies Compensations: Minimize environmental distractions;Slow rate;Small sips/bites Postural Changes: Seated upright at 90 degrees    Other  Recommendations Oral Care Recommendations: Oral care BID;Staff/trained caregiver to provide oral care     Assistance Recommended at Discharge    Functional Status Assessment Patient has had a recent decline in their functional status and demonstrates the ability to make significant improvements in function in a reasonable and predictable amount of time.  Frequency and Duration            Prognosis Prognosis for improved oropharyngeal function: Fair Barriers to Reach Goals: Cognitive deficits;Severity of deficits      Swallow Study   General Date of Onset: 03/05/24 (admit date) HPI: Pt is a 68 year old female presents with slurred speech,  left facial spasm, left sided weakness.    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 Evaluated by SLP on 03/27/24 with recommendation for regular/thin and meds crushed in vanilla pudding. SLP reconsulted as nsg reported coughing when meds  given with water . Also reported difficulty with swallowing water . Type of Study: Bedside Swallow Evaluation Previous Swallow Assessment: 2019 - mech soft, thin per EMR, 03/2024 reg/thin, SLP consulted with dietitian and more liberal diet was felt to benefit patient due to poor intake Diet Prior to this Study: NPO Temperature Spikes Noted: No Respiratory Status: Room air History of Recent Intubation: No Behavior/Cognition: Alert;Confused;Doesn't follow directions Oral Cavity Assessment: Within Functional Limits Oral Care Completed by SLP: No Oral Cavity - Dentition: Poor condition Vision: Functional for self-feeding Self-Feeding Abilities: Able to feed self (needs support/cueing due to weakness) Patient Positioning: Upright in bed Baseline Vocal Quality: Normal Volitional Cough: Cognitively unable to elicit Volitional Swallow: Unable to elicit    Oral/Motor/Sensory Function Overall Oral Motor/Sensory Function: Other (comment) (limited assessment but appears WFL)   Ice Chips Ice chips: Not tested   Thin Liquid Thin Liquid: Impaired Presentation: Cup;Straw Oral Phase Impairments: Poor awareness of bolus Oral Phase Functional Implications: Oral holding Pharyngeal  Phase Impairments: Other (comments) (WFL)    Nectar Thick Nectar Thick Liquid: Not tested   Honey Thick Honey Thick Liquid: Not tested   Puree Puree: Impaired Presentation: Spoon Oral Phase Impairments: Poor awareness of bolus Oral Phase Functional Implications: Oral holding Pharyngeal Phase Impairments:  (WFL)   Solid     Solid: Impaired Oral Phase Impairments: Poor awareness of bolus Oral Phase Functional Implications: Oral holding Pharyngeal Phase Impairments: Other (  comments) [facility]) Other Comments:  (very tiny piece of saltine)     Ronal Landry [place], MS, [employer] Speech-Language Pathologist Office: [phone] ASCOM: [phone]  Ronal FORBES Scotland 03/31/2024,10:01 AM

## 2024-03-31 NOTE — Plan of Care (Signed)
 Tamea Faith, LPN

## 2024-04-01 ENCOUNTER — Inpatient Hospital Stay

## 2024-04-01 DIAGNOSIS — E43 Unspecified severe protein-calorie malnutrition: Secondary | ICD-10-CM | POA: Insufficient documentation

## 2024-04-01 DIAGNOSIS — I160 Hypertensive urgency: Secondary | ICD-10-CM | POA: Diagnosis not present

## 2024-04-01 LAB — CBC WITH DIFFERENTIAL/PLATELET
Abs Immature Granulocytes: 0.34 K/uL — ABNORMAL HIGH (ref 0.00–0.07)
Basophils Absolute: 0.1 K/uL (ref 0.0–0.1)
Basophils Relative: 0 %
Eosinophils Absolute: 0.1 K/uL (ref 0.0–0.5)
Eosinophils Relative: 0 %
HCT: 28.7 % — ABNORMAL LOW (ref 36.0–46.0)
Hemoglobin: 9 g/dL — ABNORMAL LOW (ref 12.0–15.0)
Immature Granulocytes: 1 %
Lymphocytes Relative: 2 %
Lymphs Abs: 0.5 K/uL — ABNORMAL LOW (ref 0.7–4.0)
MCH: 29.8 pg (ref 26.0–34.0)
MCHC: 31.4 g/dL (ref 30.0–36.0)
MCV: 95 fL (ref 80.0–100.0)
Monocytes Absolute: 1.1 K/uL — ABNORMAL HIGH (ref 0.1–1.0)
Monocytes Relative: 4 %
Neutro Abs: 25.1 K/uL — ABNORMAL HIGH (ref 1.7–7.7)
Neutrophils Relative %: 93 %
Platelets: 276 K/uL (ref 150–400)
RBC: 3.02 MIL/uL — ABNORMAL LOW (ref 3.87–5.11)
RDW: 15.1 % (ref 11.5–15.5)
Smear Review: NORMAL
WBC: 27.2 K/uL — ABNORMAL HIGH (ref 4.0–10.5)
nRBC: 0 % (ref 0.0–0.2)

## 2024-04-01 LAB — BASIC METABOLIC PANEL WITH GFR
Anion gap: 19 — ABNORMAL HIGH (ref 5–15)
BUN: 65 mg/dL — ABNORMAL HIGH (ref 8–23)
CO2: 24 mmol/L (ref 22–32)
Calcium: 8.7 mg/dL — ABNORMAL LOW (ref 8.9–10.3)
Chloride: 90 mmol/L — ABNORMAL LOW (ref 98–111)
Creatinine, Ser: 8.47 mg/dL — ABNORMAL HIGH (ref 0.44–1.00)
GFR, Estimated: 5 mL/min — ABNORMAL LOW (ref >60)
Glucose, Bld: 109 mg/dL — ABNORMAL HIGH (ref 70–99)
Potassium: 4.9 mmol/L (ref 3.5–5.1)
Sodium: 133 mmol/L — ABNORMAL LOW (ref 135–145)

## 2024-04-01 LAB — GLUCOSE, CAPILLARY: Glucose-Capillary: 101 mg/dL — ABNORMAL HIGH (ref 70–99)

## 2024-04-01 NOTE — Progress Notes (Signed)
 [facility]  ROUNDING NOTE   Subjective:  Patient is known to our practice and receives outpatient dialysis treatments at [facility] on a MWF schedule, supervised by Dr. Dennise.   Update   Patient seen sitting up in bed earlier today Eating breakfast with brother and sister at bedside Agreeable to dialysis at that time   Objective:  Vital signs in last 24 hours:  Temp:  [97.5 F (36.4 C)-98.8 F (37.1 C)] 98.8 F (37.1 C) (07/21 0727) Pulse Rate:  [76-81] 77 (07/21 0727) Resp:  [16-20] 20 (07/21 0727) BP: (123-178)/(55-72) 178/57 (07/21 0727) SpO2:  [98 %-100 %] 100 % (07/21 0727)  Weight change:  Filed Weights   03/27/24 1218 03/29/24 0746 03/29/24 1140  Weight: 42.3 kg 40.1 kg 52 kg    Intake/Output: I/O last 3 completed shifts: In: 10 [I.V.:10] Out: -    Intake/Output this shift:  Total I/O In: 120 [P.O.:120] Out: -   Physical Exam: General: NAD  Head: Normocephalic  Eyes: Anicteric  Neck: Supple  Lungs:  Normal effort  Heart: Regular rate and rhythm  Abdomen:  Soft, nontender,   Extremities:  No peripheral edema.  Neurologic: Alert and awake  Skin: warm  Access: Left AVF    Basic Metabolic Panel: Recent Labs  Lab 03/27/24 0625 03/28/24 0425 03/29/24 0500 03/29/24 0520 03/30/24 0620 04/01/24 0433  NA 130* 135  --  137 139 133*  K 4.3 3.7  --  4.0 4.2 4.9  CL 89* 97*  --  98 100 90*  CO2 23 27  --  24 30 24   GLUCOSE 90 98  --  170* 143* 109*  BUN 98* 48*  --  60* 37* 65*  CREATININE 12.37* 6.90*  --  8.46* 5.17* 8.47*  CALCIUM  8.5* 8.3*  --  8.3* 8.5* 8.7*  PHOS 6.5*  --  3.4  --   --   --     Liver Function Tests: Recent Labs  Lab 03/25/24 1531 03/27/24 0625  AST 13*  --   ALT 9  --   ALKPHOS 36*  --   BILITOT 0.5  --   PROT 6.8  --   ALBUMIN 3.2* 2.8*   Recent Labs  Lab 03/25/24 1531  LIPASE 30   No results for input(s): AMMONIA in the last 168 hours.  CBC: Recent Labs  Lab 03/27/24 0625  03/28/24 0425 03/29/24 0520 03/30/24 0830 04/01/24 0433  WBC 13.2* 11.5* 12.8* 13.9* 27.2*  NEUTROABS 11.6* 9.7* 11.2* 12.4* 25.1*  HGB 10.0* 9.4* 9.3* 9.6* 9.0*  HCT 30.3* 29.0* 29.2* 30.0* 28.7*  MCV 92.4 92.1 93.9 94.6 95.0  PLT 205 231 265 273 276    Cardiac Enzymes: No results for input(s): CKTOTAL, CKMB, CKMBINDEX, TROPONINI in the last 168 hours.  BNP: Invalid input(s): POCBNP  CBG: Recent Labs  Lab 03/29/24 0734 03/29/24 1016 03/30/24 0736 03/31/24 0740 04/01/24 0723  GLUCAP 175* 126* 141* 101* 101*    Microbiology: Results for orders placed or performed during the hospital encounter of 03/05/24  Culture, blood (Routine X 2) w Reflex to ID Panel     Status: None   Collection Time: 03/21/24  5:32 PM   Specimen: BLOOD  Result Value Ref Range Status   Specimen Description BLOOD BLOOD RIGHT HAND  Final   Special Requests   Final    BOTTLES DRAWN AEROBIC AND ANAEROBIC Blood Culture adequate volume   Culture   Final    NO GROWTH 5 DAYS  Performed at [facility], [address] [place], [place] [address]    Report Status 03/26/2024 FINAL  Final  Culture, blood (Routine X 2) w Reflex to ID Panel     Status: None   Collection Time: 03/21/24  5:32 PM   Specimen: BLOOD  Result Value Ref Range Status   Specimen Description BLOOD BLOOD RIGHT ARM  Final   Special Requests   Final    BOTTLES DRAWN AEROBIC AND ANAEROBIC Blood Culture adequate volume   Culture   Final    NO GROWTH 5 DAYS Performed at [facility], [address] [place], [place] [address]    Report Status 03/26/2024 FINAL  Final    Coagulation Studies: No results for input(s): LABPROT, INR in the last 72 hours.  Urinalysis: No results for input(s): COLORURINE, LABSPEC, PHURINE, GLUCOSEU, HGBUR, BILIRUBINUR, KETONESUR, PROTEINUR, UROBILINOGEN, NITRITE, LEUKOCYTESUR in the last 72 hours.  Invalid input(s): APPERANCEUR    Imaging: DG  Chest Port 1 View Result Date: 04/01/2024 CLINICAL DATA:  Leukocytosis. EXAM: PORTABLE CHEST 1 VIEW COMPARISON:  Radiographs 03/21/2024 and 03/04/2024.  CT 07/15/2022. FINDINGS: 0953 hours. Right IJ Port-A-Cath extends to the mid SVC level. The heart size and mediastinal contours are stable with aortic atherosclerosis. There is interval improved aeration of both lung bases with a possible decreasing left pleural effusion. No pneumothorax or edema. No acute osseous findings. IMPRESSION: Interval improved aeration of both lung bases with possible decreasing left pleural effusion. No new or progressive findings. Electronically Signed   By: Elsie Perone M.D.   On: 04/01/2024 10:25     Medications:    cefTRIAXone  (ROCEPHIN )  IV 1 g (04/01/24 0947)   promethazine  (PHENERGAN ) injection (IM or IVPB) Stopped (03/25/24 1130)    (feeding supplement) PROSource Plus  30 mL Oral TID BM   aspirin   300 mg Rectal Daily   Or   aspirin   325 mg Oral Daily   atorvastatin   40 mg Oral Daily   carvedilol   12.5 mg Oral BID WC   Chlorhexidine  Gluconate Cloth  6 each Topical Daily   cloNIDine   0.3 mg Transdermal Weekly   diltiazem   180 mg Oral Daily   feeding supplement  1 Container Oral TID BM   losartan   100 mg Oral Daily   multivitamin  1 tablet Oral QHS   pantoprazole   40 mg Oral Daily   polyethylene glycol  17 g Oral Daily   QUEtiapine   100 mg Oral Daily   QUEtiapine   25 mg Oral TID   senna-docusate  1 tablet Oral BID   sodium chloride  flush  10-40 mL Intracatheter Q12H   spironolactone   25 mg Oral Daily   traZODone   50 mg Oral QHS   acetaminophen  **OR** acetaminophen  (TYLENOL ) oral liquid 160 mg/5 mL **OR** acetaminophen , bisacodyl , calcium  carbonate, haloperidol  lactate, hydrALAZINE , hydrALAZINE , labetalol , LORazepam , morphine  injection, ondansetron  (ZOFRAN ) IV, promethazine  (PHENERGAN ) injection (IM or IVPB), QUEtiapine , sodium chloride  flush, sodium chloride  flush  Assessment/ Plan:  Ms. NIKI PAYMENT is a 68 y.o.  female  with past medical conditions including end-stage renal disease on hemodialysis, dementia, hypertension, diabetes, hyperlipidemia, cervical cancer, recurrent pancreatitis, PVD and end-stage renal disease on hemodialysis.  Patient presents to the emergency department after experiencing slurred speech, left-sided facial spasms and weakness.  Patient has been admitted for TIA (transient ischemic attack) [G45.9] Hallucination [R44.3]   [facility]/MWF/Lt AVG      End-stage renal disease on hemodialysis. Patient scheduled for dialysis today, according  to HD staff.  Patient initially called for first shift however refused.  Patient is agreeable during visit to proceed with dialysis after breakfast.  HD team notified.  Patient placed on second shift.   Transient ischemic attack, reporting symptoms of slurred speech, involuntary left-sided facial spasms and weakness.  Neurology consulted, TIA versus partial complex seizure.  Brain imaging negative for acute infarct.  Carotid ultrasound shows right-sided 50 to 60% stenosis without plaque.  Symptoms resolved.     3.  Hypertension: hypertensive urgency on admission.  - Patient prescribed carvedilol , clonidine , diltiazem , losartan , and spironolactone . Hydralazine , labetalol  for PRN use            - Blood pressure 178/57 this morning.  Continue current regimen.   4. Secondary Hyperparathyroidism:             - continue sevelamer  with meals             -Calcium  acceptable at 8.7.  Phosphorus 3.4 at last check on 7/18.   5. Anemia with chronic kidney disease: Mircera as outpatient. Holding ESA due to acute ischemic event.              -Hgb 9.0.  Will continue to monitor   LOS: 25 Natacha Jepsen 7/21/20251:07 PM

## 2024-04-01 NOTE — TOC Progression Note (Signed)
 Transition of Care [facility]) - Progression Note    Patient Details  Name: Tracy Jennings MRN: [id number] Date of Birth: [birth year]  [facility]) CM/SW Contact  Dalia GORMAN Fuse, RN Phone Number: 04/01/2024, 12:14 PM  Clinical Narrative:    TOC spoke with the patient's brother [name], the family continues to wax and wane on the patient's dc dispositions. Currently, the patient is being followed by psych for hallucinations and is not medically ready for discharge.    Expected Discharge Plan: Skilled Nursing Facility Barriers to Discharge: No SNF bed  Expected Discharge Plan and Services   Discharge Planning Services: CM Consult   Living arrangements for the past 2 months: Single Family Home                                       Social Determinants of Health (SDOH) Interventions SDOH Screenings   Food Insecurity: Food Insecurity Present (03/06/2024)  Housing: Low Risk  (03/06/2024)  Transportation Needs: No Transportation Needs (03/06/2024)  Utilities: Not At Risk (03/06/2024)  Financial Resource Strain: Low Risk  (01/09/2024)   Received from [facility]  Social Connections: Unknown (03/06/2024)  Tobacco Use: Low Risk  (03/06/2024)    Readmission Risk Interventions     No data to display

## 2024-04-01 NOTE — Progress Notes (Signed)
 Occupational Therapy Treatment Patient Details Name: Tracy Jennings MRN: [id number] DOB: [birth year] Today's Date: 04/01/2024   History of present illness Pt is a 68 year old female presents with slurred speech,  left facial spasm, left sided weakness.    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   OT comments  Pt seen for OT treatment on this date. Upon arrival to room pt supine with HOB elevated, agreeable to tx at EOB however refused to stand. Pt requires CGA to Min A for supine to sit EOB, seated grooming and oral care items at EOB with SBA following set-up.  Patient required cuing for task initiation and follow-through with basic ADL task engagement, patient somewhat lethargic.  At end of session, patient requested to return to bed with CGA/Min A overall. Bed alarm activated and call button within reach.  Pt making progress toward goals, will continue to follow POC. Discharge recommendation remains appropriate.        If plan is discharge home, recommend the following:      Equipment Recommendations       Recommendations for Other Services      Precautions / Restrictions Precautions Precautions: Fall Recall of Precautions/Restrictions: Impaired Precaution/Restrictions Comments: L UE AV fistula; R chest port       Mobility Bed Mobility Overal bed mobility: Needs Assistance Bed Mobility: Supine to Sit, Sit to Supine     Supine to sit: Min assist Sit to supine: Contact guard assist        Transfers                         Balance   Sitting-balance support: Single extremity supported, Feet supported Sitting balance-Leahy Scale: Fair                                     ADL either performed or assessed with clinical judgement   ADL       Grooming: Wash/dry face;Sitting;Oral care Grooming Details (indicate cue  type and reason): Verbal cues to initiate task, no physical assist to complete                               General ADL Comments: only willing to engage in grooming/oral care tasks at EOB, refused to stand at sink    Extremity/Trunk Assessment Upper Extremity Assessment Upper Extremity Assessment: Generalized weakness            Vision Baseline Vision/History: 1 Wears glasses     Perception     Praxis     Communication Communication Communication: No apparent difficulties   Cognition Arousal: Alert Behavior During Therapy: Flat affect Cognition: Cognition impaired                               Following commands: Impaired Following commands impaired: Follows one step commands inconsistently      Cueing   Cueing Techniques: Verbal cues, Gestural cues, Tactile cues, Visual cues  Exercises Other Exercises Other Exercises: Education on benefits of attempting tasks OOB to support increasing activity tolerance    Shoulder Instructions       General Comments      Pertinent Vitals/ Pain  Pain Assessment Pain Assessment: 0-10 Pain Score: 5  Pain Location: abdomen Pain Descriptors / Indicators: Guarding, Aching, Discomfort  Home Living                                          Prior Functioning/Environment              Frequency           Progress Toward Goals  OT Goals(current goals can now be found in the care plan section)        Plan      Co-evaluation                 AM-PAC OT 6 Clicks Daily Activity     Outcome Measure   Help from another person eating meals?: A Little Help from another person taking care of personal grooming?: A Little Help from another person toileting, which includes using toliet, bedpan, or urinal?: A Lot Help from another person bathing (including washing, rinsing, drying)?: A Lot Help from another person to put on and taking off regular upper body  clothing?: A Little Help from another person to put on and taking off regular lower body clothing?: A Little 6 Click Score: 16    End of Session    OT Visit Diagnosis: Other abnormalities of gait and mobility (R26.89);Muscle weakness (generalized) (M62.81);Cognitive communication deficit (R41.841)   Activity Tolerance     Patient Left in bed;with call bell/phone within reach;with bed alarm set   Nurse Communication          Time: 8841-8773 OT Time Calculation (min): 28 min  Charges: OT General Charges $OT Visit: 1 Visit  Harlene Sharps OTR/L   Harlene LITTIE Sharps 04/01/2024, 12:36 PM

## 2024-04-01 NOTE — Progress Notes (Signed)
 Physical Therapy Treatment Patient Details Name: Tracy Jennings MRN: [id number] DOB: 02-06-1956 Today's Date: 04/01/2024   History of Present Illness Pt is a 68 year old female presents with slurred speech,  left facial spasm, left sided weakness.    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    PT Comments  Pt resting in bed upon PT arrival; pt initially declining therapy d/t abdominal pain (pt reporting pain significant but did not rate; pt reports d/t constipation; MD present during session and notified; nurse also notified) but then pt agreeable to therapy if she could walk.  Pt able to sit up on own but requiring min assist to scoot to edge of bed.  Pt with good static sitting balance but pt fatigued and then leaning on forearms to maintain upright (pt then declining to walk but agreeable to standing and taking steps next to the bed).  Pt able to stand 2x's from bed up to RW with min assist but unable to fully stand upright 2nd attempt d/t abdominal pain; able to take 5 steps in place B LE's and side step to R along bed a couple feet with min assist (with RW use).  Limited activity d/t pt fatigue and abdominal pain.  Will continue to focus on strengthening, activity tolerance, and progressive functional mobility during hospitalization.   If plan is discharge home, recommend the following: Assistance with cooking/housework;Assist for transportation;Help with stairs or ramp for entrance;Supervision due to cognitive status;A lot of help with walking and/or transfers;A lot of help with bathing/dressing/bathroom   Can travel by private vehicle     No  Equipment Recommendations  Rolling walker (2 wheels)    Recommendations for Other Services       Precautions / Restrictions Precautions Precautions: Fall Recall of Precautions/Restrictions:  Impaired Precaution/Restrictions Comments: L UE AV fistula; R chest port Restrictions Weight Bearing Restrictions Per Provider Order: No     Mobility  Bed Mobility Overal bed mobility: Needs Assistance Bed Mobility: Supine to Sit, Sit to Supine     Supine to sit: Min assist (assist to scoot to edge of bed) Sit to supine: Min assist        Transfers Overall transfer level: Needs assistance Equipment used: Rolling walker (2 wheels) Transfers: Sit to/from Stand Sit to Stand: Min assist           General transfer comment: x2 trials standing from bed (pt unable to stand fully upright 2nd attempt d/t abdominal pain); vc's for UE/LE positioning and overall technique    Ambulation/Gait Ambulation/Gait assistance: Min assist Gait Distance (Feet):  (x5 steps in place B LE's; side step to R along bed a couple feet) Assistive device: Rolling walker (2 wheels)   Gait velocity: decreased     General Gait Details: trunk flexed; increased time to take steps; decreased B LE foot clearance   Stairs             Wheelchair Mobility     Tilt Bed    Modified Rankin (Stroke Patients Only)       Balance Overall balance assessment: Needs assistance Sitting-balance support: Bilateral upper extremity supported, Feet supported Sitting balance-Leahy Scale: Fair Sitting balance - Comments: steady static sitting but pt fatigues and leans forward onto forearms to support herself   Standing balance support: Bilateral upper extremity supported, Reliant on assistive device for balance Standing balance-Leahy Scale: Poor Standing balance comment: min assist for  dynamic standing balance; B UE support on RW                            Communication Communication Communication: No apparent difficulties  Cognition Arousal: Alert Behavior During Therapy: Flat affect                           PT - Cognition Comments: Oriented to name, DOB, July (not year), and  general situation Following commands: Impaired Following commands impaired: Follows multi-step commands with increased time    Cueing Cueing Techniques: Verbal cues, Gestural cues, Tactile cues, Visual cues  Exercises      General Comments  Nursing cleared pt for participation in physical therapy.  Pt's brother present but left during session.  BP 161/61 (MAP 91) with HR 74 bpm and SpO2 sats 95% on room air at rest beginning of session.      Pertinent Vitals/Pain Pain Assessment Pain Assessment: Faces Faces Pain Scale: Hurts even more Pain Location: abdomen (pt reports d/t being constipated) Pain Descriptors / Indicators: Guarding, Aching, Discomfort Pain Intervention(s): Limited activity within patient's tolerance, Monitored during session, Repositioned, Other (comment) (RN and MD notified)    Home Living                          Prior Function            PT Goals (current goals can now be found in the care plan section) Acute Rehab PT Goals Patient Stated Goal: to go home PT Goal Formulation: With patient Time For Goal Achievement: 04/04/24 Potential to Achieve Goals: Fair Progress towards PT goals: Progressing toward goals    Frequency    Min 2X/week      PT Plan      Co-evaluation              AM-PAC PT 6 Clicks Mobility   Outcome Measure  Help needed turning from your back to your side while in a flat bed without using bedrails?: A Little Help needed moving from lying on your back to sitting on the side of a flat bed without using bedrails?: A Little Help needed moving to and from a bed to a chair (including a wheelchair)?: A Lot Help needed standing up from a chair using your arms (e.g., wheelchair or bedside chair)?: A Little Help needed to walk in hospital room?: Total Help needed climbing 3-5 steps with a railing? : Total 6 Click Score: 13    End of Session Equipment Utilized During Treatment: Gait belt Activity Tolerance: Patient  limited by fatigue Patient left: in bed;with call bell/phone within reach;with bed alarm set;Other (comment) (Lab present) Nurse Communication: Mobility status;Precautions;Other (comment) (Pt's abdominal pain (d/t constipation per pt report)) PT Visit Diagnosis: Muscle weakness (generalized) (M62.81);Difficulty in walking, not elsewhere classified (R26.2) Pain - part of body:  (abdomen)     Time: 8983-8964 PT Time Calculation (min) (ACUTE ONLY): 19 min  Charges:    $Therapeutic Activity: 8-22 mins PT General Charges $$ ACUTE PT VISIT: 1 Visit                     Damien Caulk, PT 04/01/24, 11:38 AM

## 2024-04-01 NOTE — Plan of Care (Signed)
  Problem: Ischemic Stroke/TIA Tissue Perfusion: Goal: Complications of ischemic stroke/TIA will be minimized Outcome: Progressing   Problem: Clinical Measurements: Goal: Ability to maintain clinical measurements within normal limits will improve Outcome: Progressing   Problem: Skin Integrity: Goal: Risk for impaired skin integrity will decrease Outcome: Progressing   Problem: Pain Managment: Goal: General experience of comfort will improve and/or be controlled Outcome: Progressing   Problem: Nutrition: Goal: Adequate nutrition will be maintained 04/01/2024 0406 by Melven Odilia PARAS, RN Outcome: Not Progressing

## 2024-04-01 NOTE — Progress Notes (Signed)
 Progress Note   Patient: Tracy Jennings FMW:[id number] DOB: 08/23/1956 DOA: 03/05/2024     25 DOS: the patient was seen and examined on 04/01/2024     Brief hospital course:   MYKENZIE EBANKS is a 68 year old female with ESRD on HD MWF, HTN, HLD, DM, PVD, dementia, chronic recurrent pancreatitis, stroke, cervical cancer, HIT, who presents with slurred speech, left facial spasm, left-sided weakness.  Patient had syncopal episode on 6/23 and was seen in the ED.  At that time she was found to have O2 desaturation to 80% due to pulmonary edema on chest x-ray as well as hyperkalemia with a potassium of 6.2.  She had negative head CT during that admission.  She received hemodialysis and then was discharged home.  Her last known normal was 6/24 at 1700.  Upon arrival to the ED patient's confusion had resolved entirely.  Labs in the ED mostly unremarkable, potassium 4.6, creatinine consistent with ESRD.  Blood pressure was found to be 252/79.  Patient was placed in telemetry bed for observation.  Code stroke was called.Blood pressure resolved.  Head CT was without acute intracranial abnormality.  Chronic infarct seen on right parietal lobe and extensive chronic microvascular ischemic changes with remote lacunar infarcts in the right caudate and right thalamus appreciated.  Patient was admitted for workup of presumed TIA.  LDL 96, hemoglobin A1c 5.1%.  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  MRI brain revealed chronic encephalomalacia within the right parietal lobe and advanced diffuse cerebral white matter disease.  Patient underwent carotid Dopplers which revealed elevated velocity within the distal right internal carotid artery suggesting stenosis of 50 to 69% though no significant plaque identified and thus this may be artifactual. Patient underwent hemodialysis on 6/25 but left her session early AMA.she attempted to leave the hospital AMA but during this event began  hallucinating and loss capacity.  Psychiatry was consulted and met with the patient on 6/26 and confirms acute delirium, currently the patient does not have capacity to make medical decisions.  She has been started on Seroquel  for presumed Lewy body dementia.  She has had persistent agitation and Seroquel  has been slowly increased. TOC is consulted for dispo.     Assessment and Plan:   Hypertensive urgency Blood pressure improved  Continue clonidine  patch, Coreg , Cardizem  CD, losartan , spironolactone  Nephrologist discontinue doxazosin  -- apparently pt has had issues in the past with this medication in outpatient setting Monitor blood pressure closely   Leukocytosis -possibly due to urinary tract infection Monitor CBC Blood culture showing no growth to date Follow-up on repeat blood cultures Continue empiric ceftriaxone  for possible UTI   Nausea and Vomiting-resolved Intermittent stomach pains -  Continue simethicone  Continue anticonstipation medication   Acute delirium Family have agreed with discharge to skilled nursing facility --Continued Seroquel  25 mg TID + 75 mg at bedtime --PRN Seroquel  also available for uncontrolled agitation/anxiety. Continue to avoid sedating medication Continue delirium precaution   Dementia with behavioral disturbance (HCC) Patient on Seroquel  and as needed Haldol , per psychiatry Patient was seen by psychiatry does not have the capacity to leave AMA.  Patient has been reevaluated by psychiatry    ESRD (end stage renal disease) [facility]) Nephrology following.  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   Hyperlipidemia Continue Lipitor   TIA (transient ischemic attack) Ruled out.  MRI of the brain shows chronic encephalomalacia within the right parietal lobe and advanced diffuse cerebral white matter disease,  carotid Doppler showing elevated velocity in the distal right internal carotid suggesting stenosis of 50 to 69%, EEG showing cortical dysfunction arising from the right temporal parietal region secondary to underlying structural abnormality no seizure or left to form discharges, EF normal range.   Hyponatremia Dialysis for volume management     Subjective: Patient seen and examined at bedside this morning Patient noted to have leukocytosis today up to 27,000 Blood cultures requested as well as chest x-ray    Physical Exam:   General exam: awake, alert, no acute distress, talkative HEENT: moist mucus membranes, hearing grossly normal  Respiratory system: on room air, normal respiratory effort. Cardiovascular system: normal S1/S2, RRR, no pedal edema.   Gastrointestinal system: soft, NT, ND, no HSM felt, +bowel sounds. Central nervous system: A&O x 2. no gross focal neurologic deficits, normal speech Extremities: moves all , no edema, normal tone Psychiatry: normal mood, congruent affect    Family Communication: No family at bedside     Disposition: Status is: Inpatient Remains inpatient appropriate because: needs SNF placement      Planned Discharge Destination: SNF      Data Reviewed:      Vitals:   03/31/24 1647 03/31/24 1941 04/01/24 0359 04/01/24 0727  BP: 123/72 (!) 141/55 (!) 150/63 (!) 178/57  Pulse: 81 76 77 77  Resp: 16 20 18 20   Temp: 98.4 F (36.9 C) (!) 97.5 F (36.4 C) 98.1 F (36.7 C) 98.8 F (37.1 C)  TempSrc:      SpO2: 99% 98% 98% 100%  Weight:      Height:          Latest Ref Rng & Units 04/01/2024    4:33 AM 03/30/2024    8:30 AM 03/29/2024    5:20 AM  CBC  WBC 4.0 - 10.5 K/uL 27.2  13.9  12.8   Hemoglobin 12.0 - 15.0 g/dL 9.0  9.6  9.3   Hematocrit 36.0 - 46.0 % 28.7  30.0  29.2   Platelets 150 - 400 K/uL 276  273  265        Latest Ref Rng & Units 04/01/2024    4:33 AM 03/30/2024    6:20 AM 03/29/2024    5:20 AM  BMP  Glucose 70 - 99  mg/dL 890  856  829   BUN 8 - 23 mg/dL 65  37  60   Creatinine 0.44 - 1.00 mg/dL 1.52  4.82  1.53   Sodium 135 - 145 mmol/L 133  139  137   Potassium 3.5 - 5.1 mmol/L 4.9  4.2  4.0   Chloride 98 - 111 mmol/L 90  100  98   CO2 22 - 32 mmol/L 24  30  24    Calcium  8.9 - 10.3 mg/dL 8.7  8.5  8.3      Author: Drue ONEIDA Potter, MD 04/01/2024 2:32 PM  For on call review [email or web address].

## 2024-04-02 DIAGNOSIS — I160 Hypertensive urgency: Secondary | ICD-10-CM | POA: Diagnosis not present

## 2024-04-02 LAB — GLUCOSE, CAPILLARY: Glucose-Capillary: 109 mg/dL — ABNORMAL HIGH (ref 70–99)

## 2024-04-02 LAB — CBC WITH DIFFERENTIAL/PLATELET
Abs Immature Granulocytes: 0.3 K/uL — ABNORMAL HIGH (ref 0.00–0.07)
Basophils Absolute: 0.1 K/uL (ref 0.0–0.1)
Basophils Relative: 0 %
Eosinophils Absolute: 0.1 K/uL (ref 0.0–0.5)
Eosinophils Relative: 1 %
HCT: 30.9 % — ABNORMAL LOW (ref 36.0–46.0)
Hemoglobin: 9.8 g/dL — ABNORMAL LOW (ref 12.0–15.0)
Immature Granulocytes: 2 %
Lymphocytes Relative: 3 %
Lymphs Abs: 0.5 K/uL — ABNORMAL LOW (ref 0.7–4.0)
MCH: 30.4 pg (ref 26.0–34.0)
MCHC: 31.7 g/dL (ref 30.0–36.0)
MCV: 96 fL (ref 80.0–100.0)
Monocytes Absolute: 1.2 K/uL — ABNORMAL HIGH (ref 0.1–1.0)
Monocytes Relative: 7 %
Neutro Abs: 15 K/uL — ABNORMAL HIGH (ref 1.7–7.7)
Neutrophils Relative %: 87 %
Platelets: 258 K/uL (ref 150–400)
RBC: 3.22 MIL/uL — ABNORMAL LOW (ref 3.87–5.11)
RDW: 15 % (ref 11.5–15.5)
WBC: 17.1 K/uL — ABNORMAL HIGH (ref 4.0–10.5)
nRBC: 0 % (ref 0.0–0.2)

## 2024-04-02 LAB — BASIC METABOLIC PANEL WITH GFR
Anion gap: 14 (ref 5–15)
BUN: 34 mg/dL — ABNORMAL HIGH (ref 8–23)
CO2: 30 mmol/L (ref 22–32)
Calcium: 8.5 mg/dL — ABNORMAL LOW (ref 8.9–10.3)
Chloride: 93 mmol/L — ABNORMAL LOW (ref 98–111)
Creatinine, Ser: 4.76 mg/dL — ABNORMAL HIGH (ref 0.44–1.00)
GFR, Estimated: 10 mL/min — ABNORMAL LOW (ref >60)
Glucose, Bld: 95 mg/dL (ref 70–99)
Potassium: 3.8 mmol/L (ref 3.5–5.1)
Sodium: 137 mmol/L (ref 135–145)

## 2024-04-02 MED ORDER — CLONIDINE 0.3 MG/24HR TD PTWK: 0.3000 mg | MEDICATED_PATCH | TRANSDERMAL | 12 refills | Status: DC

## 2024-04-02 MED ORDER — QUETIAPINE FUMARATE 25 MG PO TABS
25.0000 mg | ORAL_TABLET | Freq: Three times a day (TID) | ORAL | Status: DC | PRN
Start: 2024-04-02 — End: 2024-06-25

## 2024-04-02 MED ORDER — DILTIAZEM HCL ER COATED BEADS 180 MG PO CP24: 180.0000 mg | ORAL_CAPSULE | Freq: Every day | ORAL | Status: DC

## 2024-04-02 MED ORDER — ASPIRIN 81 MG PO TBEC: 81.0000 mg | DELAYED_RELEASE_TABLET | Freq: Every day | ORAL | 12 refills | Status: DC

## 2024-04-02 MED ORDER — SPIRONOLACTONE 25 MG PO TABS: 25.0000 mg | ORAL_TABLET | Freq: Every day | ORAL | Status: DC

## 2024-04-02 MED ORDER — PANTOPRAZOLE SODIUM 40 MG PO TBEC: 40.0000 mg | DELAYED_RELEASE_TABLET | Freq: Every day | ORAL | Status: DC

## 2024-04-02 MED ORDER — HYDRALAZINE HCL 25 MG PO TABS: 25.0000 mg | ORAL_TABLET | Freq: Four times a day (QID) | ORAL | Status: DC | PRN

## 2024-04-02 MED ORDER — BISACODYL 5 MG PO TBEC: 5.0000 mg | DELAYED_RELEASE_TABLET | Freq: Every day | ORAL | Status: DC | PRN

## 2024-04-02 MED ORDER — QUETIAPINE FUMARATE 100 MG PO TABS: 100.0000 mg | ORAL_TABLET | Freq: Every day | ORAL | Status: DC

## 2024-04-02 MED ORDER — HEPARIN SOD (PORK) LOCK FLUSH 100 UNIT/ML IV SOLN
500.0000 [IU] | Freq: Once | INTRAVENOUS | Status: AC
  Administered 2024-04-02: 500 [IU] via INTRAVENOUS
  Filled 2024-04-02: qty 5

## 2024-04-02 MED ORDER — AMOXICILLIN-POT CLAVULANATE 875-125 MG PO TABS: 1.0000 | ORAL_TABLET | Freq: Two times a day (BID) | ORAL | Status: AC

## 2024-04-02 MED ORDER — ATORVASTATIN CALCIUM 40 MG PO TABS: 40.0000 mg | ORAL_TABLET | Freq: Every day | ORAL | Status: DC

## 2024-04-02 MED ORDER — QUETIAPINE FUMARATE 25 MG PO TABS
25.0000 mg | ORAL_TABLET | Freq: Three times a day (TID) | ORAL | Status: DC
Start: 2024-04-02 — End: 2024-05-05

## 2024-04-02 MED ORDER — ACETAMINOPHEN 325 MG PO TABS: 650.0000 mg | ORAL_TABLET | ORAL | Status: DC | PRN

## 2024-04-02 MED ORDER — TRAZODONE HCL 50 MG PO TABS: 50.0000 mg | ORAL_TABLET | Freq: Every day | ORAL | Status: DC

## 2024-04-02 NOTE — Plan of Care (Signed)
  Problem: Coping: Goal: Will verbalize positive feelings about self Outcome: Progressing   Problem: Ischemic Stroke/TIA Tissue Perfusion: Goal: Complications of ischemic stroke/TIA will be minimized Outcome: Progressing   Problem: Clinical Measurements: Goal: Ability to maintain clinical measurements within normal limits will improve Outcome: Progressing   Problem: Activity: Goal: Risk for activity intolerance will decrease Outcome: Progressing   Problem: Safety: Goal: Ability to remain free from injury will improve Outcome: Progressing   Problem: Skin Integrity: Goal: Risk for impaired skin integrity will decrease Outcome: Progressing

## 2024-04-02 NOTE — TOC Transition Note (Signed)
 Transition of Care [facility]) - Discharge Note   Patient Details  Name: Tracy Jennings MRN: [id number] Date of Birth: 09-05-1956  [facility]) CM/SW Contact:  Dalia GORMAN Fuse, RN Phone Number: 04/02/2024, 3:42 PM   Clinical Narrative:    The patient is medically clear for discharge to [facility]. The patient is agreeable with the discharge plan. [facility] will transport.  Final next level of care: Skilled Nursing Facility Barriers to Discharge: Barriers Resolved   Patient Goals and CMS Choice            Discharge Placement              Patient chooses bed at: [facility] Patient to be transferred to facility by: [facility]   Patient and family notified of of transfer: 04/02/24  Discharge Plan and Services Additional resources added to the After Visit Summary for     Discharge Planning Services: CM Consult                                 Social Drivers of Health (SDOH) Interventions SDOH Screenings   Food Insecurity: Food Insecurity Present (03/06/2024)  Housing: Low Risk  (03/06/2024)  Transportation Needs: No Transportation Needs (03/06/2024)  Utilities: Not At Risk (03/06/2024)  Financial Resource Strain: Low Risk  (01/09/2024)   Received from [facility]  Social Connections: Unknown (03/06/2024)  Tobacco Use: Low Risk  (03/06/2024)     Readmission Risk Interventions     No data to display

## 2024-04-02 NOTE — Progress Notes (Signed)
 Home meds returned to pt s sister

## 2024-04-02 NOTE — Progress Notes (Addendum)
 Progress Note   Patient: Tracy Jennings FMW:[id number] DOB: [birth year] DOA: 03/05/2024     26 DOS: the patient was seen and examined on 04/02/2024     Brief hospital course:   Tracy Jennings is a 68 year old female with ESRD on HD MWF, HTN, HLD, DM, PVD, dementia, chronic recurrent pancreatitis, stroke, cervical cancer, HIT, who presents with slurred speech, left facial spasm, left-sided weakness.  Patient had syncopal episode on 6/23 and was seen in the ED.  At that time she was found to have O2 desaturation to 80% due to pulmonary edema on chest x-ray as well as hyperkalemia with a potassium of 6.2.  She had negative head CT during that admission.  She received hemodialysis and then was discharged home.  Her last known normal was 6/24 at 1700.  Upon arrival to the ED patient's confusion had resolved entirely.  Labs in the ED mostly unremarkable, potassium 4.6, creatinine consistent with ESRD.  Blood pressure was found to be 252/79.  Patient was placed in telemetry bed for observation.  Code stroke was called.Blood pressure resolved.  Head CT was without acute intracranial abnormality.  Chronic infarct seen on right parietal lobe and extensive chronic microvascular ischemic changes with remote lacunar infarcts in the right caudate and right thalamus appreciated.  Patient was admitted for workup of presumed TIA.  LDL 96, hemoglobin A1c 5.1%.  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  MRI brain revealed chronic encephalomalacia within the right parietal lobe and advanced diffuse cerebral white matter disease.  Patient underwent carotid Dopplers which revealed elevated velocity within the distal right internal carotid artery suggesting stenosis of 50 to 69% though no significant plaque identified and thus this may be artifactual. Patient underwent hemodialysis on 6/25 but left her session early AMA.she attempted to leave the hospital AMA but during this event began  hallucinating and loss capacity.  Psychiatry was consulted and met with the patient on 6/26 and confirms acute delirium, currently the patient does not have capacity to make medical decisions.  She has been started on Seroquel  for presumed Lewy body dementia.  She has had persistent agitation and Seroquel  has been slowly increased. TOC is consulted for dispo.      Assessment and Plan:   Hypertensive urgency Blood pressure improved  Continue clonidine  patch, Coreg , Cardizem  CD, losartan , spironolactone  Nephrologist discontinued doxazosin  -- apparently pt has had issues in the past with this medication in outpatient setting Monitor blood pressure closely   Leukocytosis -possibly due to urinary tract infection Monitor CBC Blood culture showing no growth to date Follow-up on repeat blood cultures Continue empiric ceftriaxone  for possible UTI to complete 5 days course   Nausea and Vomiting-resolved Intermittent stomach pains -  Continue simethicone  Continue anticonstipation medication   Acute delirium Family have agreed with discharge to skilled nursing facility --Continued Seroquel  25 mg TID + 75 mg at bedtime --PRN Seroquel  also available for uncontrolled agitation/anxiety. Continue to avoid sedating medication Continue delirium precaution   Dementia with behavioral disturbance (HCC) Patient on Seroquel  and as needed Haldol , per psychiatry Patient was seen by psychiatry does not have the capacity to leave AMA.  Patient has been reevaluated by psychiatry     ESRD (end stage renal disease) [facility]) Nephrology following.  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   Hyperlipidemia Continue Lipitor  TIA (transient ischemic attack) Ruled out.  MRI of the brain shows chronic encephalomalacia within the right parietal lobe and advanced diffuse  cerebral white matter disease, carotid Doppler showing elevated velocity in the distal right internal carotid suggesting stenosis of 50 to 69%, EEG showing cortical dysfunction arising from the right temporal parietal region secondary to underlying structural abnormality no seizure or left to form discharges, EF normal range.   Hyponatremia Dialysis for volume management     Subjective: Patient seen and examined at bedside this morning Leukocytosis improved Mental status much improved today Patient has been having waxing and waning of mentation Psych condition appears more controlled     Physical Exam:   General exam: awake, alert, no acute distress HEENT: moist mucus membranes, hearing grossly normal  Respiratory system: on room air, normal respiratory effort. Cardiovascular system: normal S1/S2, RRR, no pedal edema.   Gastrointestinal system: soft, NT, ND, no HSM felt, +bowel sounds. Central nervous system: A&O x 2. no gross focal neurologic deficits, normal speech Extremities: moves all , no edema, normal tone Psychiatry: normal mood, congruent affect    Family Communication: No family at bedside     Disposition: Status is: Inpatient Remains inpatient appropriate because: needs SNF placement      Planned Discharge Destination: SNF      Data Reviewed:          Latest Ref Rng & Units 04/02/2024    4:11 AM 04/01/2024    4:33 AM 03/30/2024    8:30 AM  CBC  WBC 4.0 - 10.5 K/uL 17.1  27.2  13.9   Hemoglobin 12.0 - 15.0 g/dL 9.8  9.0  9.6   Hematocrit 36.0 - 46.0 % 30.9  28.7  30.0   Platelets 150 - 400 K/uL 258  276  273        Latest Ref Rng & Units 04/02/2024    4:11 AM 04/01/2024    4:33 AM 03/30/2024    6:20 AM  BMP  Glucose 70 - 99 mg/dL 95  890  856   BUN 8 - 23 mg/dL 34  65  37   Creatinine 0.44 - 1.00 mg/dL 5.23  1.52  4.82   Sodium 135 - 145 mmol/L 137  133  139   Potassium 3.5 - 5.1 mmol/L 3.8  4.9  4.2   Chloride 98 - 111 mmol/L 93  90  100   CO2 22 -  32 mmol/L 30  24  30    Calcium  8.9 - 10.3 mg/dL 8.5  8.7  8.5      Vitals:   04/01/24 1855 04/01/24 2141 04/02/24 0450 04/02/24 0850  BP: 127/66 (!) 112/58 (!) 116/43 (!) 148/58  Pulse: 62 69 63 66  Resp: 18 19 16 19   Temp:  98.5 F (36.9 C)  97.8 F (36.6 C)  TempSrc:  Oral    SpO2: 100% 98% 100% 98%  Weight:      Height:         Author: Drue ONEIDA Potter, MD 04/02/2024 1:28 PM  For on call review [email or web address].

## 2024-04-02 NOTE — Progress Notes (Signed)
 [facility]  ROUNDING NOTE   Subjective:  Patient is known to our practice and receives outpatient dialysis treatments at [facility] on a MWF schedule, supervised by Dr. Dennise.   Update   Patient seen sitting up in bed earlier today Sister at bedside Alert, calm Tolerating small meals    Objective:  Vital signs in last 24 hours:  Temp:  [97.8 F (36.6 C)-98.5 F (36.9 C)] 97.8 F (36.6 C) (07/22 0850) Pulse Rate:  [59-73] 66 (07/22 0850) Resp:  [9-19] 19 (07/22 0850) BP: (79-148)/(43-66) 148/58 (07/22 0850) SpO2:  [98 %-100 %] 98 % (07/22 0850) Weight:  [52 kg] 52 kg (07/21 1510)  Weight change:  Filed Weights   03/29/24 0746 03/29/24 1140 04/01/24 1510  Weight: 40.1 kg 52 kg 52 kg    Intake/Output: I/O last 3 completed shifts: In: 565 [P.O.:345; I.V.:20; IV Piggyback:200] Out: 0.3 [Other:0.3]   Intake/Output this shift:  No intake/output data recorded.  Physical Exam: General: NAD  Head: Normocephalic  Eyes: Anicteric  Neck: Supple  Lungs:  Normal effort  Heart: Regular rate and rhythm  Abdomen:  Soft, nontender,   Extremities:  No peripheral edema.  Neurologic: Alert and awake  Skin: warm  Access: Left AVF    Basic Metabolic Panel: Recent Labs  Lab 03/27/24 0625 03/28/24 0425 03/29/24 0500 03/29/24 0520 03/30/24 0620 04/01/24 0433 04/02/24 0411  NA 130* 135  --  137 139 133* 137  K 4.3 3.7  --  4.0 4.2 4.9 3.8  CL 89* 97*  --  98 100 90* 93*  CO2 23 27  --  24 30 24 30   GLUCOSE 90 98  --  170* 143* 109* 95  BUN 98* 48*  --  60* 37* 65* 34*  CREATININE 12.37* 6.90*  --  8.46* 5.17* 8.47* 4.76*  CALCIUM  8.5* 8.3*  --  8.3* 8.5* 8.7* 8.5*  PHOS 6.5*  --  3.4  --   --   --   --     Liver Function Tests: Recent Labs  Lab 03/27/24 0625  ALBUMIN 2.8*   No results for input(s): LIPASE, AMYLASE in the last 168 hours.  No results for input(s): AMMONIA in the last 168 hours.  CBC: Recent Labs  Lab  03/28/24 0425 03/29/24 0520 03/30/24 0830 04/01/24 0433 04/02/24 0411  WBC 11.5* 12.8* 13.9* 27.2* 17.1*  NEUTROABS 9.7* 11.2* 12.4* 25.1* 15.0*  HGB 9.4* 9.3* 9.6* 9.0* 9.8*  HCT 29.0* 29.2* 30.0* 28.7* 30.9*  MCV 92.1 93.9 94.6 95.0 96.0  PLT 231 265 273 276 258    Cardiac Enzymes: No results for input(s): CKTOTAL, CKMB, CKMBINDEX, TROPONINI in the last 168 hours.  BNP: Invalid input(s): POCBNP  CBG: Recent Labs  Lab 03/29/24 1016 03/30/24 0736 03/31/24 0740 04/01/24 0723 04/02/24 0719  GLUCAP 126* 141* 101* 101* 109*    Microbiology: Results for orders placed or performed during the hospital encounter of 03/05/24  Culture, blood (Routine X 2) w Reflex to ID Panel     Status: None   Collection Time: 03/21/24  5:32 PM   Specimen: BLOOD  Result Value Ref Range Status   Specimen Description BLOOD BLOOD RIGHT HAND  Final   Special Requests   Final    BOTTLES DRAWN AEROBIC AND ANAEROBIC Blood Culture adequate volume   Culture   Final    NO GROWTH 5 DAYS Performed at [facility], [address] [place], [place] [address]    Report Status  03/26/2024 FINAL  Final  Culture, blood (Routine X 2) w Reflex to ID Panel     Status: None   Collection Time: 03/21/24  5:32 PM   Specimen: BLOOD  Result Value Ref Range Status   Specimen Description BLOOD BLOOD RIGHT ARM  Final   Special Requests   Final    BOTTLES DRAWN AEROBIC AND ANAEROBIC Blood Culture adequate volume   Culture   Final    NO GROWTH 5 DAYS Performed at [facility], [address], [place], [place] [address]    Report Status 03/26/2024 FINAL  Final    Coagulation Studies: No results for input(s): LABPROT, INR in the last 72 hours.  Urinalysis: No results for input(s): COLORURINE, LABSPEC, PHURINE, GLUCOSEU, HGBUR, BILIRUBINUR, KETONESUR, PROTEINUR, UROBILINOGEN, NITRITE, LEUKOCYTESUR in the last 72 hours.  Invalid input(s): APPERANCEUR     Imaging: DG Chest Port 1 View Result Date: 04/01/2024 CLINICAL DATA:  Leukocytosis. EXAM: PORTABLE CHEST 1 VIEW COMPARISON:  Radiographs 03/21/2024 and 03/04/2024.  CT 07/15/2022. FINDINGS: 0953 hours. Right IJ Port-A-Cath extends to the mid SVC level. The heart size and mediastinal contours are stable with aortic atherosclerosis. There is interval improved aeration of both lung bases with a possible decreasing left pleural effusion. No pneumothorax or edema. No acute osseous findings. IMPRESSION: Interval improved aeration of both lung bases with possible decreasing left pleural effusion. No new or progressive findings. Electronically Signed   By: Elsie Perone M.D.   On: 04/01/2024 10:25     Medications:    cefTRIAXone  (ROCEPHIN )  IV 1 g (04/02/24 1001)   promethazine  (PHENERGAN ) injection (IM or IVPB) Stopped (03/25/24 1130)    (feeding supplement) PROSource Plus  30 mL Oral TID BM   aspirin   300 mg Rectal Daily   Or   aspirin   325 mg Oral Daily   atorvastatin   40 mg Oral Daily   carvedilol   12.5 mg Oral BID WC   Chlorhexidine  Gluconate Cloth  6 each Topical Daily   cloNIDine   0.3 mg Transdermal Weekly   diltiazem   180 mg Oral Daily   feeding supplement  1 Container Oral TID BM   losartan   100 mg Oral Daily   multivitamin  1 tablet Oral QHS   pantoprazole   40 mg Oral Daily   polyethylene glycol  17 g Oral Daily   QUEtiapine   100 mg Oral Daily   QUEtiapine   25 mg Oral TID   senna-docusate  1 tablet Oral BID   sodium chloride  flush  10-40 mL Intracatheter Q12H   spironolactone   25 mg Oral Daily   traZODone   50 mg Oral QHS   acetaminophen  **OR** acetaminophen  (TYLENOL ) oral liquid 160 mg/5 mL **OR** acetaminophen , bisacodyl , calcium  carbonate, haloperidol  lactate, hydrALAZINE , hydrALAZINE , labetalol , LORazepam , morphine  injection, ondansetron  (ZOFRAN ) IV, promethazine  (PHENERGAN ) injection (IM or IVPB), QUEtiapine , sodium chloride  flush, sodium chloride  flush  Assessment/  Plan:  Tracy Jennings is a 68 y.o.  female  with past medical conditions including end-stage renal disease on hemodialysis, dementia, hypertension, diabetes, hyperlipidemia, cervical cancer, recurrent pancreatitis, PVD and end-stage renal disease on hemodialysis.  Patient presents to the emergency department after experiencing slurred speech, left-sided facial spasms and weakness.  Patient has been admitted for TIA (transient ischemic attack) [G45.9] Hallucination [R44.3]   [facility] [place]/MWF/Lt AVG      End-stage renal disease on hemodialysis.Patient received dialysis yesterday, UF achieved. Next treatment scheduled for Wednesday.    Transient ischemic attack, reporting symptoms of slurred speech, involuntary  left-sided facial spasms and weakness.  Neurology consulted, TIA versus partial complex seizure.  Brain imaging negative for acute infarct.  Carotid ultrasound shows right-sided 50 to 60% stenosis without plaque.  Symptoms resolved.     3.  Hypertension: hypertensive urgency on admission.  - Patient prescribed carvedilol , clonidine , diltiazem , losartan , and spironolactone . Hydralazine , labetalol  for PRN use            - Blood pressure 148/58, acceptable   4. Secondary Hyperparathyroidism:             - continue sevelamer  with meals             -Calcium  acceptable at 8.5.   - Will continue to monitor.   5. Anemia with chronic kidney disease: Mircera as outpatient. Holding ESA due to acute ischemic event.              -Hgb 9.8.    LOS: 26 Doylene Splinter 7/22/202511:20 AM

## 2024-04-02 NOTE — TOC Progression Note (Addendum)
 Transition of Care [facility]) - Progression Note    Patient Details  Name: Tracy Jennings MRN: [id number] Date of Birth: 09/09/1956  [facility] CM/SW Contact  Dalia GORMAN Fuse, RN Phone Number: 04/02/2024, 2:25 PM  Clinical Narrative:    TOC spoke with the patient and her brother, [name], and her sister, [name], in the room. The patient verbalized that she would like to go to [facility] for STR. Auth received for [facility] Approved  JluyPI#[id number]  Dates:7/22-7/24/2025  Next Review Date:  04/04/2024  Expected Discharge Plan: Skilled Nursing Facility Barriers to Discharge: No SNF bed  Expected Discharge Plan and Services   Discharge Planning Services: CM Consult   Living arrangements for the past 2 months: Single Family Home                                       Social Determinants of Health (SDOH) Interventions SDOH Screenings   Food Insecurity: Food Insecurity Present (03/06/2024)  Housing: Low Risk  (03/06/2024)  Transportation Needs: No Transportation Needs (03/06/2024)  Utilities: Not At Risk (03/06/2024)  Financial Resource Strain: Low Risk  (01/09/2024)   Received from [facility]  Social Connections: Unknown (03/06/2024)  Tobacco Use: Low Risk  (03/06/2024)    Readmission Risk Interventions     No data to display

## 2024-04-02 NOTE — Discharge Summary (Signed)
 Physician Discharge Summary   Patient: Tracy Jennings DOB: 1956/09/02  Admit date:     03/05/2024  Discharge date: 04/02/24  Discharge Physician: Drue ONEIDA Potter   PCP: Zachary Idelia LABOR, MD   Recommendations at discharge:  Follow-up with PCP, nephrology as well as psychiatry  Discharge Diagnoses: Principal Problem:   Hypertensive urgency Active Problems:   Acute delirium   Dementia with behavioral disturbance (HCC)   ESRD (end stage renal disease) (HCC)   Myocardial injury   Type II diabetes mellitus with renal manifestations (HCC)   Anemia in ESRD (end-stage renal disease) (HCC)   Hyperlipidemia   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 [facility] Course:  Tracy Jennings is a 69 year old female with ESRD on HD MWF, HTN, HLD, DM, PVD, dementia, chronic recurrent pancreatitis, stroke, cervical cancer, HIT, who presents with slurred speech, left facial spasm, left-sided weakness.  Patient had syncopal episode on 6/23 and was seen in the ED.  At that time she was found to have O2 desaturation to 80% due to pulmonary edema on chest x-ray as well as hyperkalemia with a potassium of 6.2.  She had negative head CT during that admission.  She received hemodialysis and then was discharged home.  Her last known normal was 6/24 at 1700.  Upon arrival to the ED patient's confusion had resolved entirely.  Labs in the ED mostly unremarkable, potassium 4.6, creatinine consistent with ESRD.  Blood pressure was found to be 252/79.  Patient was placed in telemetry bed for observation.  Code stroke was called.Blood pressure resolved.  Head CT was without acute intracranial abnormality.  Chronic infarct seen on right parietal lobe and extensive chronic microvascular ischemic changes with remote lacunar infarcts in the right  caudate and right thalamus appreciated.  Patient was admitted for workup of presumed TIA.  LDL 96, hemoglobin A1c 5.1%.  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  MRI brain revealed chronic encephalomalacia within the right parietal lobe and advanced diffuse cerebral white matter disease.  Patient underwent carotid Dopplers which revealed elevated velocity within the distal right internal carotid artery suggesting stenosis of 50 to 69% though no significant plaque identified and thus this may be artifactual. Patient underwent hemodialysis on 6/25 but left her session early AMA.she attempted to leave the hospital AMA but during this event began hallucinating and loss capacity.  Psychiatry was consulted and met with the patient on 6/26 and confirms acute delirium, currently the patient does not have capacity to make medical decisions.  She has been started on Seroquel  for presumed Lewy body dementia.  She has had persistent agitation and Seroquel  has been slowly increased. TOC is consulted for dispo.   Detailed hospital course as outlined below:  Assessment and Plan: Hypertensive urgency Blood pressure improved  Continue clonidine  patch, Coreg , Cardizem  CD, losartan , spironolactone  Nephrologist discontinued doxazosin  -- apparently pt has had issues in the past with this medication in outpatient setting Monitor blood pressure closely   Leukocytosis -possibly due to urinary tract infection Monitor CBC Blood culture showing no growth to date Follow-up on repeat blood cultures Patient received ceftriaxone  for empiric antibiotics-been transition to Augmentin    Nausea and Vomiting-resolved Intermittent stomach pains -  Continue simethicone  Continue anticonstipation medication   Acute delirium Family have agreed with discharge to skilled nursing facility --Continued  Seroquel  25 mg TID + 75 mg at bedtime --PRN Seroquel  also available for uncontrolled  agitation/anxiety. Continue to avoid sedating medication Continue delirium precaution   Dementia with behavioral disturbance (HCC) Patient on Seroquel  and as needed Haldol , per psychiatry Patient was seen by psychiatry does not have the capacity to leave AMA.  Patient has been reevaluated by psychiatry     ESRD (end stage renal disease) [facility]) Nephrology following.  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   Hyperlipidemia Continue Lipitor   TIA (transient ischemic attack) Ruled out.  MRI of the brain shows chronic encephalomalacia within the right parietal lobe and advanced diffuse cerebral white matter disease, carotid Doppler showing elevated velocity in the distal right internal carotid suggesting stenosis of 50 to 69%, EEG showing cortical dysfunction arising from the right temporal parietal region secondary to underlying structural abnormality no seizure or left to form discharges, EF normal range.   Hyponatremia Dialysis for volume management     Consultants: Nephrology, psychiatry Procedures performed: Hemodialysis Disposition: Skilled nursing facility Diet recommendation:  Cardiac diet DISCHARGE MEDICATION: Allergies as of 04/02/2024       Reactions   Amlodipine  Swelling   Knees down to ankles Knees down to ankles   Hctz [hydrochlorothiazide]    pancreatitis   Heparin  Other (See Comments)   Hx  HIT,   Pt reports cardiac arrest when given heparin    Lmw Heparin     Reports cardiac arrest when given heparin    Other Other (See Comments)   Reports cardiac arrest when given during surgery Reports cardiac arrest when given during surgery Reports cardiac arrest when given during surgery   Sulfa Antibiotics Rash   Lactose    Dairy Aid [tilactase]    Runny nose   Hydralazine     Nausea and rash        Medication List      STOP taking these medications    amLODipine  10 MG tablet Commonly known as: NORVASC    calcium  acetate 667 MG capsule Commonly known as: PHOSLO    cinacalcet 30 MG tablet Commonly known as: SENSIPAR   cyclobenzaprine  10 MG tablet Commonly known as: FLEXERIL    diazepam  5 MG tablet Commonly known as: VALIUM    doxazosin  4 MG tablet Commonly known as: CARDURA    isosorbide  mononitrate 60 MG 24 hr tablet Commonly known as: IMDUR    loperamide 2 MG capsule Commonly known as: IMODIUM   oxyCODONE  5 MG immediate release tablet Commonly known as: Roxicodone        TAKE these medications    acetaminophen  325 MG tablet Commonly known as: TYLENOL  Take 2 tablets (650 mg total) by mouth every 4 (four) hours as needed for mild pain (pain score 1-3) or fever (or temp > 37.5 C (99.5 F)).   amoxicillin -clavulanate 875-125 MG tablet Commonly known as: AUGMENTIN  Take 1 tablet by mouth 2 (two) times daily for 3 days.   aspirin  EC 81 MG tablet Take 1 tablet (81 mg total) by mouth daily. Swallow whole.   atorvastatin  40 MG tablet Commonly known as: LIPITOR Take 1 tablet (40 mg total) by mouth daily. Start taking on: April 03, 2024   bisacodyl  5 MG EC tablet Commonly known as: DULCOLAX Take 1 tablet (5 mg total) by mouth daily as needed for moderate constipation.   carvedilol  25 MG tablet Commonly known as: COREG  Take 1 tablet (25 mg  total) by mouth 2 (two) times daily with a meal.   cloNIDine  0.3 mg/24hr patch Commonly known as: CATAPRES  - Dosed in mg/24 hr Place 1 patch (0.3 mg total) onto the skin once a week. Start taking on: April 07, 2024   diltiazem  180 MG 24 hr capsule Commonly known as: CARDIZEM  CD Take 1 capsule (180 mg total) by mouth daily. Start taking on: April 03, 2024   hydrALAZINE  25 MG tablet Commonly known as: APRESOLINE  Take 1 tablet (25 mg total) by mouth every 6 (six) hours as needed (SBP >160).   losartan  100 MG tablet Commonly known as:  COZAAR  SMARTSIG:1 Tablet(s) By Mouth Every Evening   pantoprazole  40 MG tablet Commonly known as: PROTONIX  Take 1 tablet (40 mg total) by mouth daily.   QUEtiapine  25 MG tablet Commonly known as: SEROQUEL  Take 1 tablet (25 mg total) by mouth 3 (three) times daily.   QUEtiapine  25 MG tablet Commonly known as: SEROQUEL  Take 1 tablet (25 mg total) by mouth every 8 (eight) hours as needed (psychosis/anxiety).   QUEtiapine  100 MG tablet Commonly known as: SEROQUEL  Take 1 tablet (100 mg total) by mouth at bedtime.   spironolactone  25 MG tablet Commonly known as: ALDACTONE  Take 1 tablet (25 mg total) by mouth daily. Start taking on: April 03, 2024   traZODone  50 MG tablet Commonly known as: DESYREL  Take 1 tablet (50 mg total) by mouth at bedtime.               Durable Medical Equipment  (From admission, onward)           Start     Ordered   03/15/24 1607  For home use only DME Bedside commode  Once       Comments: 3 IN 1 BEDSIDE COMMODE  Question:  Patient needs a bedside commode to treat with the following condition  Answer:  Generalized weakness   03/15/24 1606   03/15/24 1605  For home use only DME Walker rolling  Once       Question Answer Comment  Walker: With 5 Inch Wheels   Patient needs a walker to treat with the following condition Generalized weakness      03/15/24 1605   03/15/24 0000  For home use only DME 4 wheeled rolling walker with seat       Question:  Patient needs a walker to treat with the following condition  Answer:  Dizziness   03/15/24 1613   03/15/24 0000  DME Bedside commode       Question:  Patient needs a bedside commode to treat with the following condition  Answer:  Dizziness   03/15/24 1613            Contact information for follow-up providers     Zachary Idelia LABOR, MD Follow up.   Specialty: Family Medicine Why: Hospital follow up Contact information: [address] [place] [address] [phone]          Care, [facility] Follow up.   Why: 07/04--Per [facility] following for HHRN/PT/OT/HHA Contact information: [address] [place] [address] [phone]         [facility], [facility] Follow up.   Why: 07/04---Per [name], processing bedside commode. Due to patient receiving a   wheelchair in 2021, insurance will not cover rolling walker. Contact information: [address] [place] [address] [phone]  Contact information for after-discharge care     Destination     [facility] .   Service: Skilled Nursing Contact information: [address] [place]  [phone]                    Discharge Exam: Tracy Jennings   03/29/24 0746 03/29/24 1140 04/01/24 1510  Weight: 40.1 kg 52 kg 52 kg   General exam: awake, alert, no acute distress, talkative HEENT: moist mucus membranes, hearing grossly normal  Respiratory system: on room air, normal respiratory effort. Cardiovascular system: normal S1/S2, RRR, no pedal edema.   Gastrointestinal system: soft, NT, ND, no HSM felt, +bowel sounds. Central nervous system: A&O x 2. no gross focal neurologic deficits, normal speech Extremities: moves all , no edema, normal tone Psychiatry: normal mood, congruent affect    Condition at discharge: good  The results of significant diagnostics from this hospitalization (including imaging, microbiology, ancillary and laboratory) are listed below for reference.   Imaging Studies: DG Chest Port 1 View Result Date: 04/01/2024 CLINICAL DATA:  Leukocytosis. EXAM: PORTABLE CHEST 1 VIEW COMPARISON:  Radiographs 03/21/2024 and 03/04/2024.  CT 07/15/2022. FINDINGS: 0953 hours. Right IJ Port-A-Cath extends to the mid SVC level. The heart size and mediastinal contours are stable with aortic atherosclerosis. There is interval improved aeration of both lung bases with a possible  decreasing left pleural effusion. No pneumothorax or edema. No acute osseous findings. IMPRESSION: Interval improved aeration of both lung bases with possible decreasing left pleural effusion. No new or progressive findings. Electronically Signed   By: Elsie Perone M.D.   On: 04/01/2024 10:25   DG Abd Portable 1V Result Date: 03/21/2024 CLINICAL DATA:  Leukocytosis. EXAM: PORTABLE ABDOMEN - 1 VIEW COMPARISON:  Abdominal radiograph dated 05/11/2018. FINDINGS: Moderate stool throughout the colon. No bowel dilatation or evidence of obstruction. No free air. Right upper quadrant cholecystectomy clips. Degenerative changes of the spine. No acute osseous pathology. IMPRESSION: Moderate colonic stool burden. No bowel obstruction. Electronically Signed   By: Vanetta Chou M.D.   On: 03/21/2024 16:30   DG Chest Port 1 View Result Date: 03/21/2024 CLINICAL DATA:  Leukocytosis. EXAM: PORTABLE CHEST 1 VIEW COMPARISON:  Chest radiograph dated 03/04/2024. FINDINGS: Right-sided Port-A-Cath in similar position. There is cardiomegaly with vascular congestion. Small left pleural effusion and left lung base atelectasis or infiltrate, improved since the prior radiograph. No pneumothorax. No acute pathology. IMPRESSION: 1. Cardiomegaly with vascular congestion. 2. Small left pleural effusion and left lung base atelectasis or infiltrate, improved since the prior radiograph. Electronically Signed   By: Vanetta Chou M.D.   On: 03/21/2024 16:30   EEG adult Result Date: 03/07/2024 Shelton Arlin KIDD, MD     03/07/2024  1:23 PM Patient Name: Tracy Jennings MRN: [id number] Epilepsy Attending: Arlin KIDD Shelton Referring Physician/Provider: Merrianne Locus, MD Date: 03/07/2024 Duration: 41.54 mins Patient history: 68yo F with slurred speech, left-sided facial spasms and weakness. EEG to evaluate for seizure Level of alertness: Awake, asleep AEDs during EEG study: None Technical aspects: This EEG study was done with scalp  electrodes positioned according to the 10-20 International system of electrode placement. Electrical activity was reviewed with band pass filter of 1-70Hz , sensitivity of 7 uV/mm, display speed of 51mm/sec with a 60Hz  notched filter applied as appropriate. EEG data were recorded continuously and digitally stored.  Video monitoring was available and reviewed as appropriate. Description: The posterior dominant rhythm consists of 8-9 Hz activity of moderate voltage (  25-35 uV) seen predominantly in posterior head regions, symmetric and reactive to eye opening and eye closing. Sleep was characterized by vertex waves, sleep spindles (12 to 14 Hz), maximal frontocentral region. EEG showed continuous 3 to 6 Hz theta-delta slowing  in right temporo-parietal region. Hyperventilation and photic stimulation were not performed.   ABNORMALITY - Continuous slow,  right temporo-parietal region. IMPRESSION: This study is suggestive of cortical dysfunction arising from right temporo-parietal region likely secondary to underlying structural abnormality. No seizures or epileptiform discharges were seen throughout the recording. Please note lac of epileptiform activity during interictal EEG does not exclude the diagnosis of epilepsy. Arlin MALVA Krebs   ECHOCARDIOGRAM COMPLETE Result Date: 03/07/2024    ECHOCARDIOGRAM REPORT   Patient Name:   Tracy Jennings Date of Exam: 03/06/2024 Medical Rec #:  Jennings        Height:       63.0 in Accession #:    [id number]       Weight:       119.9 lb Date of Birth:  26-Nov-1955        BSA:          1.556 m Patient Age:    68 years         BP:           177/86 mmHg Patient Gender: F                HR:           56 bpm. Exam Location:  [facility] Procedure: 2D Echo, Cardiac Doppler and Color Doppler (Both Spectral and Color            Flow Doppler were utilized during procedure). Indications:     TIA G45.9  History:         Patient has prior history of Echocardiogram examinations.                   Signs/Symptoms:Murmur.  Sonographer:     Bari Roar Referring Phys:  [id number] XILIN NIU Diagnosing Phys: Evalene Lunger MD  Sonographer Comments: Image acquisition challenging due to uncooperative patient. IMPRESSIONS  1. Left ventricular ejection fraction, by estimation, is 55 to 60%. The left ventricle has normal function. The left ventricle has no regional wall motion abnormalities. There is severe left ventricular hypertrophy. Left ventricular diastolic parameters  are consistent with Grade II diastolic dysfunction (pseudonormalization).  2. Right ventricular systolic function is normal. The right ventricular size is normal. There is moderately elevated pulmonary artery systolic pressure. The estimated right ventricular systolic pressure is 46.2 mmHg.  3. The mitral valve is normal in structure. No evidence of mitral valve regurgitation. Mild mitral stenosis. The mean mitral valve gradient is 5.0 mmHg. Moderate mitral annular calcification.  4. Tricuspid valve regurgitation is mild to moderate.  5. The aortic valve is calcified. There is moderate calcification of the aortic valve. Aortic valve regurgitation is mild to moderate. Mild to moderate aortic valve stenosis. Aortic valve mean gradient measures 16.5 mmHg.  6. The inferior vena cava is normal in size with greater than 50% respiratory variability, suggesting right atrial pressure of 3 mmHg. FINDINGS  Left Ventricle: Left ventricular ejection fraction, by estimation, is 55 to 60%. The left ventricle has normal function. The left ventricle has no regional wall motion abnormalities. Strain was performed and the global longitudinal strain is indeterminate. The left ventricular internal cavity size was normal in size. There is severe left ventricular hypertrophy. Left ventricular diastolic parameters  are consistent with Grade II diastolic dysfunction (pseudonormalization). Right Ventricle: The right ventricular size is normal. No increase in right ventricular  wall thickness. Right ventricular systolic function is normal. There is moderately elevated pulmonary artery systolic pressure. The tricuspid regurgitant velocity is 3.21 m/s, and with an assumed right atrial pressure of 5 mmHg, the estimated right ventricular systolic pressure is 46.2 mmHg. Left Atrium: Left atrial size was normal in size. Right Atrium: Right atrial size was normal in size. Pericardium: There is no evidence of pericardial effusion. Mitral Valve: The mitral valve is normal in structure. There is mild calcification of the mitral valve leaflet(s). Moderate mitral annular calcification. No evidence of mitral valve regurgitation. Mild mitral valve stenosis. MV peak gradient, 13.1 mmHg. The mean mitral valve gradient is 5.0 mmHg. Tricuspid Valve: The tricuspid valve is normal in structure. Tricuspid valve regurgitation is mild to moderate. No evidence of tricuspid stenosis. Aortic Valve: The aortic valve is calcified. There is moderate calcification of the aortic valve. Aortic valve regurgitation is mild to moderate. Aortic regurgitation PHT measures 485 msec. Mild to moderate aortic stenosis is present. Aortic valve mean gradient measures 16.5 mmHg. Aortic valve peak gradient measures 31.8 mmHg. Aortic valve area, by VTI measures 0.88 cm. Pulmonic Valve: The pulmonic valve was normal in structure. Pulmonic valve regurgitation is not visualized. No evidence of pulmonic stenosis. Aorta: The aortic root is normal in size and structure. Venous: The inferior vena cava is normal in size with greater than 50% respiratory variability, suggesting right atrial pressure of 3 mmHg. IAS/Shunts: No atrial level shunt detected by color flow Doppler. Additional Comments: 3D was performed not requiring image post processing on an independent workstation and was indeterminate. There is a small pleural effusion in the left lateral region.  LEFT VENTRICLE PLAX 2D LVIDd:         3.60 cm   Diastology LVIDs:         2.50 cm    LV e' medial:    4.57 cm/s LV PW:         1.70 cm   LV E/e' medial:  33.7 LV IVS:        1.60 cm   LV e' lateral:   5.00 cm/s LVOT diam:     1.70 cm   LV E/e' lateral: 30.8 LV SV:         52 LV SV Index:   34 LVOT Area:     2.27 cm  RIGHT VENTRICLE RV Basal diam:  3.30 cm RV Mid diam:    2.80 cm RV S prime:     11.00 cm/s TAPSE (M-mode): 2.1 cm LEFT ATRIUM             Index        RIGHT ATRIUM           Index LA diam:        4.00 cm 2.57 cm/m   RA Area:     16.30 cm LA Vol (A2C):   59.0 ml 37.92 ml/m  RA Volume:   42.70 ml  27.44 ml/m LA Vol (A4C):   59.0 ml 37.92 ml/m LA Biplane Vol: 59.0 ml 37.92 ml/m  AORTIC VALVE                     PULMONIC VALVE AV Area (Vmax):    0.90 cm      PV Vmax:          1.04 m/s AV Area (Vmean):  0.84 cm      PV Peak grad:     4.3 mmHg AV Area (VTI):     0.88 cm      PR End Diast Vel: 8.53 msec AV Vmax:           282.00 cm/s   RVOT Peak grad:   2 mmHg AV Vmean:          187.000 cm/s AV VTI:            0.592 m AV Peak Grad:      31.8 mmHg AV Mean Grad:      16.5 mmHg LVOT Vmax:         112.00 cm/s LVOT Vmean:        69.300 cm/s LVOT VTI:          0.230 m LVOT/AV VTI ratio: 0.39 AI PHT:            485 msec  AORTA Ao Root diam: 2.80 cm Ao Asc diam:  2.80 cm MITRAL VALVE                TRICUSPID VALVE MV Area (PHT): 3.23 cm     TR Peak grad:   41.2 mmHg MV Area VTI:   1.15 cm     TR Vmax:        321.00 cm/s MV Peak grad:  13.1 mmHg MV Mean grad:  5.0 mmHg     SHUNTS MV Vmax:       1.81 m/s     Systemic VTI:  0.23 m MV Vmean:      105.0 cm/s   Systemic Diam: 1.70 cm MV Decel Time: 235 msec MV E velocity: 154.00 cm/s MV A velocity: 121.00 cm/s MV E/A ratio:  1.27 MV A Prime:    9.2 cm/s Evalene Lunger MD Electronically signed by Evalene Lunger MD Signature Date/Time: 03/07/2024/12:12:01 PM    Final    US  Carotid Bilateral (at [facility] and [facility] only) Result Date: 03/06/2024 CLINICAL DATA:  Transient ischemic attack EXAM: BILATERAL CAROTID DUPLEX ULTRASOUND TECHNIQUE: Elnor scale  imaging, color Doppler and duplex ultrasound were performed of bilateral carotid and vertebral arteries in the neck. COMPARISON:  None Available. FINDINGS: Criteria: Quantification of carotid stenosis is based on velocity parameters that correlate the residual internal carotid diameter with NASCET-based stenosis levels, using the diameter of the distal internal carotid lumen as the denominator for stenosis measurement. The following velocity measurements were obtained: RIGHT ICA: 186 cm/sec CCA: 60 cm/sec SYSTOLIC ICA/CCA RATIO:  3.1 ECA: 112 cm/sec LEFT ICA: 103 cm/sec CCA: 166 cm/sec SYSTOLIC ICA/CCA RATIO:  0.6 ECA: 37 cm/sec RIGHT CAROTID ARTERY: There is no significant plaque in the proximal right internal carotid artery. An elevated velocity is measured in the distal right internal carotid artery. RIGHT VERTEBRAL ARTERY:  Antegrade LEFT CAROTID ARTERY:  There is no significant plaque. LEFT VERTEBRAL ARTERY:  Antegrade. IMPRESSION: 1. Right carotid system: An elevated velocity is measured within the distal right internal carotid artery which suggests a stenosis between 50 and 69%. There is no significant plaque identified and therefore this may be artifactual. Consideration can be given toward further evaluation by CT angiogram for further characterization if clinically appropriate. 2. Left carotid system: No evidence of significant flow limiting stenosis. Electronically Signed   By: Maude Naegeli M.D.   On: 03/06/2024 12:47   MR ANGIO HEAD WO CONTRAST Result Date: 03/06/2024 CLINICAL DATA:  Stroke/TIA, determine embolic source EXAM: MRA HEAD WITHOUT CONTRAST TECHNIQUE: Angiographic images  of the Circle of Willis were acquired using MRA technique without intravenous contrast. COMPARISON:  CT head dated March 05, 2024. FINDINGS: Anterior circulation: Limited secondary to patient motion. Diminutive or stenotic right A1 segment. No obvious aneurysm or large vessel occlusion. Posterior circulation: Limited  secondary to motion. Moderate focal stenosis of the proximal basilar artery. Anatomic variants: None. Other: None. IMPRESSION: 1. Limited study secondary to patient motion. 2. 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 Electronically Signed   By: Evalene Coho M.D.   On: 03/06/2024 10:36   MR BRAIN WO CONTRAST Result Date: 03/06/2024 CLINICAL DATA:  Transient ischemic attack (TIA) EXAM: MRI HEAD WITHOUT CONTRAST TECHNIQUE: Multiplanar, multiecho pulse sequences of the brain and surrounding structures were obtained without intravenous contrast. COMPARISON:  CT of the head dated March 05, 2024. FINDINGS: Brain: There is no restricted diffusion to indicate acute or recent infarction. There are encephalomalacia changes within the right parietal lobe. A chronic lacunar infarct is noted within the right caudate nucleus. There is moderate diffuse cerebral white matter disease present. There is no evidence of hemorrhage, mass, acute cortical infarct or hydrocephalus. Vascular: Normal flow voids. Skull and upper cervical spine: Normal marrow signal. Sinuses/Orbits: Status post bilateral lens replacement. Mild mucosal disease within the maxillary sinuses. Other: None. IMPRESSION: 1. Chronic encephalomalacia within the right parietal lobe and moderately advanced diffuse cerebral white matter disease. Electronically Signed   By: Evalene Coho M.D.   On: 03/06/2024 10:31   CT HEAD CODE STROKE WO CONTRAST Result Date: 03/05/2024 CLINICAL DATA:  Code stroke.  Neuro deficit, concern for stroke. EXAM: CT HEAD WITHOUT CONTRAST TECHNIQUE: Contiguous axial images were obtained from the base of the skull through the vertex without intravenous contrast. RADIATION DOSE REDUCTION: This exam was performed according to the departmental dose-optimization program which includes automated exposure control, adjustment of the mA and/or kV according to patient size and/or use of iterative reconstruction  technique. COMPARISON:  CT head 03/04/2024 and earlier. FINDINGS: Brain: No acute intracranial hemorrhage. No CT evidence of new large territory infarct. Encephalomalacia in the right parietal lobe compatible with remote infarct a similar appearance of associated calcification. Nonspecific hypoattenuation in the periventricular and subcortical white matter favored to reflect chronic microvascular ischemic changes. Additional punctate calcification in the right frontal lobe. Remote lacunar infarcts in the right caudate and right thalamus. Mild parenchymal volume loss. No midline shift. Basilar cisterns are patent. Ventricles: The ventricles are normal. Vascular: Atherosclerotic calcifications of the carotid siphons. No hyperdense vessel. Skull: No acute or aggressive finding. Orbits: Bilateral lens replacement.  Orbits otherwise unremarkable. Sinuses: Mucosal thickening in the right maxillary sinus. Other: Trace fluid in the left mastoid tip. Calcification of multiple vessels in the scalp. ASPECTS [facility] Stroke Program Early CT Score) - Ganglionic level infarction (caudate, lentiform nuclei, internal capsule, insula, M1-M3 cortex): 7 - Supraganglionic infarction (M4-M6 cortex): 3 Total score (0-10 with 10 being normal): 10 IMPRESSION: 1. No CT evidence of acute intracranial abnormality. 2. 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 Extensive chronic microvascular ischemic changes with remote lacunar infarcts in the right caudate and right thalamus. 3. ASPECTS is 10 These results were communicated to Dr. Lindzen at 6:15 pm on 03/05/2024 by text page via the [facility] messaging system. Electronically Signed   By: Donnice Mania M.D.   On: 03/05/2024 18:15   DG Chest 2 View Result Date: 03/04/2024 CLINICAL DATA:  Shortness of breath EXAM: CHEST - 2 VIEW COMPARISON:  09/14/2022 FINDINGS: Right Port-A-Cath remains  in place, unchanged. Heart and mediastinal contours within normal limits. Moderate left pleural  effusion noted. Vascular congestion and interstitial/airspace opacities bilaterally, likely reflecting edema although infection not excluded. No visible significant right effusion. No pneumothorax. No acute bony abnormality. IMPRESSION: Moderate left pleural effusion. Vascular congestion with bilateral diffuse interstitial prominence and airspace opacities concerning for edema although infection not excluded. Electronically Signed   By: Franky Crease M.D.   On: 03/04/2024 15:11   CT Head Wo Contrast Result Date: 03/04/2024 CLINICAL DATA:  Mental status change, unknown cause.  Recent falls. EXAM: CT HEAD WITHOUT CONTRAST TECHNIQUE: Contiguous axial images were obtained from the base of the skull through the vertex without intravenous contrast. RADIATION DOSE REDUCTION: This exam was performed according to the departmental dose-optimization program which includes automated exposure control, adjustment of the mA and/or kV according to patient size and/or use of iterative reconstruction technique. COMPARISON:  Head CT 03/29/2022 FINDINGS: Brain: There is no evidence of an acute infarct, intracranial hemorrhage, mass, midline shift, or extra-axial fluid collection. A moderate-sized chronic right parietal cortical infarct is again noted with a small amount of associated calcification. A punctate hyperdense focus in the subcortical white matter of the anterior right frontal lobe is also unchanged and may reflect calcification. Patchy to confluent hypodensities elsewhere in the cerebral white matter bilaterally are nonspecific but compatible with extensive chronic small vessel ischemic disease. There unchanged chronic lacunar infarcts in the bilateral caudate nuclei. There is mild cerebral atrophy. Vascular: Calcified atherosclerosis at the skull base. No hyperdense vessel. Skull: No fracture or suspicious lesion. Sinuses/Orbits: Visualized paranasal sinuses are clear. Trace left mastoid effusion. Bilateral cataract  extraction. Other: None. IMPRESSION: 1. No evidence of acute intracranial abnormality. 2. Chronic right parietal infarct and extensive chronic small vessel ischemic disease. Electronically Signed   By: Dasie Hamburg M.D.   On: 03/04/2024 14:47    Microbiology: Results for orders placed or performed during the hospital encounter of 03/05/24  Culture, blood (Routine X 2) w Reflex to ID Panel     Status: None   Collection Time: 03/21/24  5:32 PM   Specimen: BLOOD  Result Value Ref Range Status   Specimen Description BLOOD BLOOD RIGHT HAND  Final   Special Requests   Final    BOTTLES DRAWN AEROBIC AND ANAEROBIC Blood Culture adequate volume   Culture   Final    NO GROWTH 5 DAYS Performed at [facility], [address] [place], [place] [address]    Report Status 03/26/2024 FINAL  Final  Culture, blood (Routine X 2) w Reflex to ID Panel     Status: None   Collection Time: 03/21/24  5:32 PM   Specimen: BLOOD  Result Value Ref Range Status   Specimen Description BLOOD BLOOD RIGHT ARM  Final   Special Requests   Final    BOTTLES DRAWN AEROBIC AND ANAEROBIC Blood Culture adequate volume   Culture   Final    NO GROWTH 5 DAYS Performed at [facility], [address] [place], [place] [address]    Report Status 03/26/2024 FINAL  Final    Labs: CBC: Recent Labs  Lab 03/28/24 0425 03/29/24 0520 03/30/24 0830 04/01/24 0433 04/02/24 0411  WBC 11.5* 12.8* 13.9* 27.2* 17.1*  NEUTROABS 9.7* 11.2* 12.4* 25.1* 15.0*  HGB 9.4* 9.3* 9.6* 9.0* 9.8*  HCT 29.0* 29.2* 30.0* 28.7* 30.9*  MCV 92.1 93.9 94.6 95.0 96.0  PLT 231 265 273 276 258   Basic Metabolic Panel: Recent Labs  Lab 03/27/24 0625 03/28/24 0425 03/29/24 0500 03/29/24 0520 03/30/24 0620 04/01/24 0433 04/02/24 0411  NA 130* 135  --  137 139 133* 137  K 4.3 3.7  --  4.0 4.2 4.9 3.8  CL 89* 97*  --  98 100 90* 93*  CO2 23 27  --  24 30 24 30   GLUCOSE 90 98  --  170* 143* 109* 95  BUN 98* 48*  --  60*  37* 65* 34*  CREATININE 12.37* 6.90*  --  8.46* 5.17* 8.47* 4.76*  CALCIUM  8.5* 8.3*  --  8.3* 8.5* 8.7* 8.5*  PHOS 6.5*  --  3.4  --   --   --   --    Liver Function Tests: Recent Labs  Lab 03/27/24 0625  ALBUMIN 2.8*   CBG: Recent Labs  Lab 03/29/24 1016 03/30/24 0736 03/31/24 0740 04/01/24 0723 04/02/24 0719  GLUCAP 126* 141* 101* 101* 109*    Discharge time spent:  36 minutes.  Signed: Drue ONEIDA Potter, MD [facility] 04/02/2024

## 2024-04-06 LAB — CULTURE, BLOOD (ROUTINE X 2)
Culture: NO GROWTH
Culture: NO GROWTH
Special Requests: ADEQUATE

## 2024-04-28 ENCOUNTER — Other Ambulatory Visit: Payer: Self-pay

## 2024-04-28 ENCOUNTER — Observation Stay
Admission: EM | Admit: 2024-04-28 | Discharge: 2024-05-05 | Disposition: A | Discharge status: Home / Self Care | Attending: Emergency Medicine | Admitting: Emergency Medicine

## 2024-04-28 ENCOUNTER — Emergency Department

## 2024-04-28 DIAGNOSIS — S32059A Unspecified fracture of fifth lumbar vertebra, initial encounter for closed fracture: Secondary | ICD-10-CM | POA: Insufficient documentation

## 2024-04-28 DIAGNOSIS — J90 Pleural effusion, not elsewhere classified: Secondary | ICD-10-CM | POA: Diagnosis not present

## 2024-04-28 DIAGNOSIS — E876 Hypokalemia: Secondary | ICD-10-CM | POA: Insufficient documentation

## 2024-04-28 DIAGNOSIS — I739 Peripheral vascular disease, unspecified: Secondary | ICD-10-CM | POA: Diagnosis not present

## 2024-04-28 DIAGNOSIS — R1084 Generalized abdominal pain: Principal | ICD-10-CM

## 2024-04-28 DIAGNOSIS — Z7982 Long term (current) use of aspirin: Secondary | ICD-10-CM | POA: Insufficient documentation

## 2024-04-28 DIAGNOSIS — F03918 Unspecified dementia, unspecified severity, with other behavioral disturbance: Secondary | ICD-10-CM | POA: Diagnosis present

## 2024-04-28 DIAGNOSIS — S32049A Unspecified fracture of fourth lumbar vertebra, initial encounter for closed fracture: Secondary | ICD-10-CM | POA: Insufficient documentation

## 2024-04-28 DIAGNOSIS — W19XXXA Unspecified fall, initial encounter: Secondary | ICD-10-CM | POA: Diagnosis not present

## 2024-04-28 DIAGNOSIS — N186 End stage renal disease: Secondary | ICD-10-CM | POA: Diagnosis not present

## 2024-04-28 DIAGNOSIS — D631 Anemia in chronic kidney disease: Secondary | ICD-10-CM | POA: Diagnosis not present

## 2024-04-28 DIAGNOSIS — I10 Essential (primary) hypertension: Secondary | ICD-10-CM | POA: Diagnosis present

## 2024-04-28 DIAGNOSIS — R109 Unspecified abdominal pain: Secondary | ICD-10-CM | POA: Diagnosis present

## 2024-04-28 DIAGNOSIS — E785 Hyperlipidemia, unspecified: Secondary | ICD-10-CM | POA: Diagnosis present

## 2024-04-28 DIAGNOSIS — I120 Hypertensive chronic kidney disease with stage 5 chronic kidney disease or end stage renal disease: Secondary | ICD-10-CM | POA: Diagnosis not present

## 2024-04-28 DIAGNOSIS — A0472 Enterocolitis due to Clostridium difficile, not specified as recurrent: Secondary | ICD-10-CM | POA: Diagnosis not present

## 2024-04-28 DIAGNOSIS — M6281 Muscle weakness (generalized): Secondary | ICD-10-CM | POA: Diagnosis not present

## 2024-04-28 DIAGNOSIS — Z992 Dependence on renal dialysis: Secondary | ICD-10-CM | POA: Insufficient documentation

## 2024-04-28 DIAGNOSIS — I700 Atherosclerosis of aorta: Secondary | ICD-10-CM | POA: Insufficient documentation

## 2024-04-28 DIAGNOSIS — K219 Gastro-esophageal reflux disease without esophagitis: Secondary | ICD-10-CM

## 2024-04-28 DIAGNOSIS — R2681 Unsteadiness on feet: Secondary | ICD-10-CM | POA: Diagnosis not present

## 2024-04-28 DIAGNOSIS — D638 Anemia in other chronic diseases classified elsewhere: Secondary | ICD-10-CM | POA: Diagnosis present

## 2024-04-28 LAB — CBC
HCT: 25.7 % — ABNORMAL LOW (ref 36.0–46.0)
HCT: 26.7 % — ABNORMAL LOW (ref 36.0–46.0)
Hemoglobin: 7.8 g/dL — ABNORMAL LOW (ref 12.0–15.0)
Hemoglobin: 8 g/dL — ABNORMAL LOW (ref 12.0–15.0)
MCH: 30.1 pg (ref 26.0–34.0)
MCH: 29.9 pg (ref 26.0–34.0)
MCHC: 30.4 g/dL (ref 30.0–36.0)
MCHC: 30 g/dL (ref 30.0–36.0)
MCV: 99.2 fL (ref 80.0–100.0)
MCV: 99.6 fL (ref 80.0–100.0)
Platelets: 276 K/uL (ref 150–400)
Platelets: 204 K/uL (ref 150–400)
RBC: 2.59 MIL/uL — ABNORMAL LOW (ref 3.87–5.11)
RBC: 2.68 MIL/uL — ABNORMAL LOW (ref 3.87–5.11)
RDW: 15.3 % (ref 11.5–15.5)
RDW: 15.2 % (ref 11.5–15.5)
WBC: 15.1 K/uL — ABNORMAL HIGH (ref 4.0–10.5)
WBC: 13.8 K/uL — ABNORMAL HIGH (ref 4.0–10.5)
nRBC: 0 % (ref 0.0–0.2)
nRBC: 0 % (ref 0.0–0.2)

## 2024-04-28 LAB — GASTROINTESTINAL PANEL BY PCR, STOOL (REPLACES STOOL CULTURE)

## 2024-04-28 LAB — COMPREHENSIVE METABOLIC PANEL WITH GFR
ALT: 9 U/L (ref 0–44)
AST: 13 U/L — ABNORMAL LOW (ref 15–41)
Albumin: 2.3 g/dL — ABNORMAL LOW (ref 3.5–5.0)
Alkaline Phosphatase: 71 U/L (ref 38–126)
Anion gap: 16 — ABNORMAL HIGH (ref 5–15)
BUN: 26 mg/dL — ABNORMAL HIGH (ref 8–23)
CO2: 24 mmol/L (ref 22–32)
Calcium: 8.5 mg/dL — ABNORMAL LOW (ref 8.9–10.3)
Chloride: 98 mmol/L (ref 98–111)
Creatinine, Ser: 5.08 mg/dL — ABNORMAL HIGH (ref 0.44–1.00)
GFR, Estimated: 9 mL/min — ABNORMAL LOW (ref >60)
Glucose, Bld: 59 mg/dL — ABNORMAL LOW (ref 70–99)
Potassium: 3.4 mmol/L — ABNORMAL LOW (ref 3.5–5.1)
Sodium: 138 mmol/L (ref 135–145)
Total Bilirubin: 1.4 mg/dL — ABNORMAL HIGH (ref 0.0–1.2)
Total Protein: 6.5 g/dL (ref 6.5–8.1)

## 2024-04-28 LAB — C DIFFICILE QUICK SCREEN W PCR REFLEX
C Diff antigen: POSITIVE — AB
C Diff toxin: NEGATIVE

## 2024-04-28 LAB — GLUCOSE, CAPILLARY
Glucose-Capillary: 61 mg/dL — ABNORMAL LOW (ref 70–99)
Glucose-Capillary: 63 mg/dL — ABNORMAL LOW (ref 70–99)

## 2024-04-28 LAB — LIPASE, BLOOD: Lipase: 21 U/L (ref 11–51)

## 2024-04-28 LAB — CLOSTRIDIUM DIFFICILE BY PCR, REFLEXED
Hypervirulent Strain: NEGATIVE
Toxigenic C. Difficile by PCR: POSITIVE — AB

## 2024-04-28 MED ORDER — LIDOCAINE-PRILOCAINE 2.5-2.5 % EX CREA: 1.0000 | TOPICAL_CREAM | CUTANEOUS | Status: DC | PRN

## 2024-04-28 MED ORDER — INSULIN ASPART 100 UNIT/ML IJ SOLN
0.0000 [IU] | Freq: Three times a day (TID) | INTRAMUSCULAR | Status: DC
  Administered 2024-05-02: 1 [IU] via SUBCUTANEOUS
  Filled 2024-04-28 (×2): qty 1

## 2024-04-28 MED ORDER — HYDROCODONE-ACETAMINOPHEN 5-325 MG PO TABS
1.0000 | ORAL_TABLET | ORAL | Status: DC | PRN
  Administered 2024-05-04: 1 via ORAL
  Filled 2024-04-28: qty 1

## 2024-04-28 MED ORDER — MORPHINE SULFATE (PF) 2 MG/ML IV SOLN: 1.0000 mg | Freq: Four times a day (QID) | INTRAVENOUS | Status: DC | PRN

## 2024-04-28 MED ORDER — HEPARIN SODIUM (PORCINE) 1000 UNIT/ML DIALYSIS: 1000.0000 [IU] | INTRAMUSCULAR | Status: DC | PRN

## 2024-04-28 MED ORDER — SENNOSIDES-DOCUSATE SODIUM 8.6-50 MG PO TABS: 1.0000 | ORAL_TABLET | Freq: Every evening | ORAL | Status: DC | PRN

## 2024-04-28 MED ORDER — ACETAMINOPHEN 650 MG RE SUPP: 650.0000 mg | Freq: Four times a day (QID) | RECTAL | Status: DC | PRN

## 2024-04-28 MED ORDER — LACTATED RINGERS IV BOLUS
500.0000 mL | Freq: Once | INTRAVENOUS | Status: AC
  Administered 2024-04-28: 500 mL via INTRAVENOUS

## 2024-04-28 MED ORDER — PENTAFLUOROPROP-TETRAFLUOROETH EX AERO: 1.0000 | INHALATION_SPRAY | CUTANEOUS | Status: DC | PRN

## 2024-04-28 MED ORDER — ACETAMINOPHEN 325 MG PO TABS: 650.0000 mg | ORAL_TABLET | Freq: Four times a day (QID) | ORAL | Status: DC | PRN

## 2024-04-28 MED ORDER — SODIUM CHLORIDE 0.9% FLUSH
3.0000 mL | Freq: Two times a day (BID) | INTRAVENOUS | Status: DC
  Administered 2024-04-28 – 2024-05-05 (×12): 3 mL via INTRAVENOUS

## 2024-04-28 MED ORDER — ONDANSETRON HCL 4 MG PO TABS: 4.0000 mg | ORAL_TABLET | Freq: Four times a day (QID) | ORAL | Status: DC | PRN

## 2024-04-28 MED ORDER — FIDAXOMICIN 200 MG PO TABS
200.0000 mg | ORAL_TABLET | Freq: Two times a day (BID) | ORAL | Status: DC
  Filled 2024-04-28: qty 1

## 2024-04-28 MED ORDER — BISACODYL 5 MG PO TBEC: 5.0000 mg | DELAYED_RELEASE_TABLET | Freq: Every day | ORAL | Status: DC | PRN

## 2024-04-28 MED ORDER — SODIUM CHLORIDE 0.9 % IV SOLN: INTRAVENOUS | Status: DC

## 2024-04-28 MED ORDER — ALTEPLASE 2 MG IJ SOLR: 2.0000 mg | Freq: Once | INTRAMUSCULAR | Status: DC | PRN

## 2024-04-28 MED ORDER — CHLORHEXIDINE GLUCONATE CLOTH 2 % EX PADS
6.0000 | MEDICATED_PAD | Freq: Every day | CUTANEOUS | Status: DC
  Administered 2024-04-29 – 2024-05-05 (×6): 6 via TOPICAL

## 2024-04-28 MED ORDER — LIDOCAINE HCL (PF) 1 % IJ SOLN
5.0000 mL | INTRAMUSCULAR | Status: DC | PRN
  Filled 2024-04-28: qty 5

## 2024-04-28 MED ORDER — ONDANSETRON HCL 4 MG/2ML IJ SOLN
4.0000 mg | Freq: Four times a day (QID) | INTRAMUSCULAR | Status: DC | PRN
Start: 2024-04-28 — End: 2024-05-05

## 2024-04-28 MED ORDER — INSULIN ASPART 100 UNIT/ML IJ SOLN
0.0000 [IU] | Freq: Every day | INTRAMUSCULAR | Status: DC
  Filled 2024-04-28: qty 1

## 2024-04-28 MED ORDER — POTASSIUM CHLORIDE CRYS ER 20 MEQ PO TBCR
40.0000 meq | EXTENDED_RELEASE_TABLET | Freq: Two times a day (BID) | ORAL | Status: AC
  Administered 2024-04-28 – 2024-04-29 (×2): 40 meq via ORAL
  Filled 2024-04-28 (×2): qty 2

## 2024-04-28 MED ORDER — FIDAXOMICIN 200 MG PO TABS
200.0000 mg | ORAL_TABLET | Freq: Two times a day (BID) | ORAL | Status: DC
  Administered 2024-04-28: 200 mg via ORAL
  Filled 2024-04-28 (×2): qty 1

## 2024-04-28 NOTE — ED Notes (Signed)
 IV team at bedside starting line at this time.

## 2024-04-28 NOTE — ED Provider Notes (Signed)
 [facility] Provider Note    Event Date/Time   First MD Initiated Contact with Patient 04/28/24 1504     (approximate)   History   Abdominal Pain   HPI  Tracy Jennings is a 68 y.o. female with history of diabetes, ESRD on dialysis, hypertension, presenting with abdominal pain as well as diarrhea.  Diarrhea is nonbloody, not melanotic.  Abdominal pain started 5 days ago, associated to with generalized weakness and nausea.  States that she has been compliant with her dialysis, gets it done Monday Wednesday Friday, has not missed any treatments.  Patient states that she voids but maybe once every 2 weeks.  No fever chest pain no shortness of breath.  No vomiting.  No rash or hives.  No unilateral calf swelling or tenderness.  No lightheadedness or headache.  Per independent history from family at bedside, patient lives at home, since her last hospitalization she has been quite debilitated and weak.  No recent trauma or falls.  They felt that her face was slightly puffy yesterday, states that the puffiness is improved today, still has some very mild periorbital swelling.  No new medications recently.  No new exposures.  She has an indwelling port from her prior chemotherapy, no longer on chemo at this time.  She just completed antibiotics for possible pneumonia.    On independent review, she was admitted in late July for delirium, hypertensive urgency, also had some focal deficits that self resolved, MRI showed chronic encephalomalacia.  She has history of dementia with behavioral disturbances.    Physical Exam   Triage Vital Signs: ED Triage Vitals  Encounter Vitals Group     BP 04/28/24 1350 (!) 124/55     Girls Systolic BP Percentile --      Girls Diastolic BP Percentile --      Boys Systolic BP Percentile --      Boys Diastolic BP Percentile --      Pulse Rate 04/28/24 1350 65     Resp 04/28/24 1350 19     Temp 04/28/24 1350 98.1 F (36.7 C)     Temp  src --      SpO2 04/28/24 1350 99 %     Weight 04/28/24 1351 114 lb (51.7 kg)     Height --      Head Circumference --      Peak Flow --      Pain Score 04/28/24 1351 4     Pain Loc --      Pain Education --      Exclude from Growth Chart --     Most recent vital signs: Vitals:   04/28/24 1530 04/28/24 1830  BP: (!) 145/47 (!) 139/47  Pulse: 65 64  Resp: 12 16  Temp:  98.1 F (36.7 C)  SpO2: 99% 100%     General: Awake, no distress.  CV:  Good peripheral perfusion.  Resp:  Normal effort.  No tachypnea or respiratory distress Abd:  No distention.  Soft nontender Other:  Very slight periorbital swelling bilaterally, no other obvious facial swelling, no unilateral capsular tenderness, trace edema to her ankles, she is moving all 4 extremities without focal weakness or numbness, no facial droop, left upper extremity AV fistula with palpable thrill, she has a port to her right chest that is nontender to palpation, no overlying erythema, induration, purulent drainage.   ED Results / Procedures / Treatments   Labs (all labs ordered are listed, but  only abnormal results are displayed) Labs Reviewed  C DIFFICILE QUICK SCREEN W PCR REFLEX   - Abnormal; Notable for the following components:      Result Value   C Diff antigen POSITIVE (*)    All other components within normal limits  CLOSTRIDIUM DIFFICILE BY PCR, REFLEXED - Abnormal; Notable for the following components:   Toxigenic C. Difficile by PCR POSITIVE (*)    All other components within normal limits  COMPREHENSIVE METABOLIC PANEL WITH GFR - Abnormal; Notable for the following components:   Potassium 3.4 (*)    Glucose, Bld 59 (*)    BUN 26 (*)    Creatinine, Ser 5.08 (*)    Calcium  8.5 (*)    Albumin 2.3 (*)    AST 13 (*)    Total Bilirubin 1.4 (*)    GFR, Estimated 9 (*)    Anion gap 16 (*)    All other components within normal limits  CBC - Abnormal; Notable for the following components:   WBC 15.1 (*)    RBC  2.59 (*)    Hemoglobin 7.8 (*)    HCT 25.7 (*)    All other components within normal limits  GASTROINTESTINAL PANEL BY PCR, STOOL (REPLACES STOOL CULTURE)  LIPASE, BLOOD     EKG  EKG shows, sinus rhythm, rate 64, normal QS, normal QTc, no obvious ischemic ST elevation, T wave flattening 3, aVF, V3, T wave changes are new compared to prior   RADIOLOGY On my independent interpretation, CT without obvious free air   PROCEDURES:  Critical Care performed: No  Procedures   MEDICATIONS ORDERED IN ED: Medications  fidaxomicin  (DIFICID ) tablet 200 mg (has no administration in time range)  lactated ringers  bolus 500 mL (0 mLs Intravenous Stopped 04/28/24 1847)     IMPRESSION / MDM / ASSESSMENT AND PLAN / ED COURSE  I reviewed the triage vital signs and the nursing notes.                              Differential diagnosis includes, but is not limited to, viral illness, colitis, diverticulitis, electrolyte derangements, UTI, dehydration, medication side effects.  Labs, CT abdomen pelvis.   Patient's presentation is most consistent with acute presentation with potential threat to life or bodily function.  Independent interpretation of labs and imaging below.  Labs notable for anemia, rectal exam was done with chaperone, no hematochezia or melena.  Suspect this might be anemia of chronic disease.  No other source of bleeding elsewhere.  On reassessment patient is not complaining of any pain, states that she feels fine.  Discussed with her about imaging lab results.  CT does show subtle effusion and some anasarca, wonder if she is third spacing given her low albumin.  Family said that she has had decreased p.o. intake recently.  Will give her a little bit of fluids here given that she had some dry mucous membranes.  C. difficile PCR came back positive for toxigenic C. difficile, positive antigen, GI PCR is negative.  Will start her on antibiotics and plan to have her admitted for further  management.  Consult the hospitalist was agreeable with plan for mission will evaluate patient.  She is admitted.  The patient is on the cardiac monitor to evaluate for evidence of arrhythmia and/or significant heart rate changes.   Clinical Course as of 04/28/24 2038  [place] Apr 28, 2024  1557 Independent review of labs,  she has leukocytosis, lipase is normal, electrolytes are not severely deranged, creatinine is elevated but she has history of ESRD, her hemoglobin is 7.8 which is down from 9 compared to prior [TT]  1621 CT ABDOMEN PELVIS WO CONTRAST IMPRESSION: 1. Chronic compression deformity of the inferior endplate of the L4 vertebral body, with impaction of the L5 vertebral body as above. This results in progressive retrolisthesis of L5 relative to L4 and worsening facet hypertrophic changes at that level. 2. Diffuse body wall edema consistent with anasarca. 3. Patchy bibasilar consolidation, left greater than right, favor atelectasis over airspace disease. 4. Trace bilateral pleural effusions. 5. Significant fecal retention throughout the colon compatible with constipation. No bowel obstruction or ileus.   [TT]  1821 Straight cath attempted but no urine in bladder. [TT]  2017 Gastrointestinal Panel by PCR , Stool Negative [TT]  2017 Patient is C. difficile antigen positive, toxin negative, PCR flex is positive for toxigenic C. difficile with little to no toxin production. [TT]    Clinical Course User Index [TT] [place], Lorelle Cummins, MD     FINAL CLINICAL IMPRESSION(S) / ED DIAGNOSES   Final diagnoses:  Generalized abdominal pain  C. difficile colitis     Rx / DC Orders   ED Discharge Orders     None        Note:  This document was prepared using Dragon voice recognition software and may include unintentional dictation errors.    [place] Lorelle Cummins, MD 04/28/24 2038

## 2024-04-28 NOTE — ED Triage Notes (Signed)
 Pt BIB EMS with c/o ABD pain that stared 5 days ago. Pt reports generalized weakness, diarrhea, and nausea. Pt is a HD and has treatments MWF. Pt has not missed any treatments. Pt reports swelling to her eyes and ABD.

## 2024-04-28 NOTE — ED Notes (Signed)
 Assisted pt into bed from wheelchair. Pt had diarrhea while trying to stand to get into bed. PT changed, clean brief applied, warm blanket and pillow provided to pt. Sister at bedside. Call bell in reach.

## 2024-04-28 NOTE — H&P (Addendum)
 History and Physical   [facility] [place] @ [facility] Admission History and Physical [employer], D.O.    Patient Name: Tracy Jennings MR#: [id number] Date of Birth: [birth year] Date of Admission: 04/28/2024  Referring MD/NP/PA: Dr. Tam Primary Care Physician: Zachary Idelia LABOR, MD  Chief Complaint:  Chief Complaint  Patient presents with   Abdominal Pain    HPI: Tracy Jennings is a 68 y.o. female with a known history of diabetes, end-stage renal disease on dialysis Monday Wednesday Friday last session was 2 days ago, hypertension, iron deficiency anemia, hyperlipidemia, peripheral vascular disease, recurrent pancreatitis, dementia, TIA, cervical cancer previously on chemotherapy presents to the emergency department for evaluation of abdominal pain.  Patient was in a usual state of health until 5 days ago she reports the onset of abdominal pain described as generalized, associated with nausea and diarrhea..  She reports that her abdominal pain is entirely resolved.  Of note patient was taking an unknown antibiotics for a possible pneumonia.  She was admitted in July for hypertensive emergency and altered mental status.  Patient denies fevers/chills, weakness, dizziness, chest pain, shortness of breath, dysuria/frequency, changes in mental status.    Otherwise there has been no change in status. Patient has been taking medication as prescribed and there has been no recent change in medication or diet.  No recent antibiotics.  There has been no recent illness, hospitalizations, travel or sick contacts.    EMS/ED Course: Patient received fidaxomicin , lactated Ringer 's. Medical admission has been requested for further management of C. difficile colitis.  Review of Systems:  CONSTITUTIONAL: No fever/chills, fatigue, weakness, weight gain/loss, headache. EYES: No blurry or double vision. ENT: No tinnitus, postnasal drip, redness or soreness of the oropharynx. RESPIRATORY: No  cough, dyspnea, wheeze.  No hemoptysis.  CARDIOVASCULAR: No chest pain, palpitations, syncope, orthopnea. No lower extremity edema.  GASTROINTESTINAL: Positive abdominal pain, nausea, diarrhea.  No vomiting or constipation.  No hematemesis or melena. GENITOURINARY: No dysuria, frequency, hematuria. ENDOCRINE: No polyuria or nocturia. No heat or cold intolerance. HEMATOLOGY: No anemia, bruising, bleeding. INTEGUMENTARY: No rashes, ulcers, lesions. MUSCULOSKELETAL: No arthritis, gout. NEUROLOGIC: No numbness, tingling, ataxia, seizure-type activity, weakness. PSYCHIATRIC: No anxiety, depression, insomnia.   Past Medical History:  Diagnosis Date   Anemia 04/2018   low iron. to be started on supplements   Cervical cancer (HCC)    CKD (chronic kidney disease)    Stage IV   Complication of anesthesia    receceived too much anesthesia, that she was in coma for a couple days    COVID-19 virus detected 10/28/2019   Diabetes mellitus without complication (HCC)    type II   ESRD (end stage renal disease) (HCC)    Heart murmur    followed as a child only   HSIL (high grade squamous intraepithelial lesion) on Pap smear of cervix    Hyperlipidemia associated with type 2 diabetes mellitus (HCC)    Hypertension    Pancreatitis    Peripheral vascular disease (HCC)     Past Surgical History:  Procedure Laterality Date   AMPUTATION TOE Left 2013   2nd toe. tip of toe (toe nail was infected)   AV FISTULA PLACEMENT Left 05/11/2018   Procedure: ARTERIOVENOUS (AV) FISTULA CREATION;  Surgeon: Jama Cordella MATSU, MD;  Location: [facility];  Service: Vascular;  Laterality: Left;   CATARACT EXTRACTION     CERVICAL CONIZATION W/BX N/A 04/08/2020   Procedure: CONIZATION CERVIX WITH BIOPSY;  Surgeon: Mancil Barter, MD;  Location:  [facility];  Service: Gynecology;  Laterality: N/A;   CHOLECYSTECTOMY  2014   COLONOSCOPY     COLONOSCOPY WITH PROPOFOL  N/A 01/10/2018   Procedure: COLONOSCOPY WITH PROPOFOL ;   Surgeon: Toledo, Ladell POUR, MD;  Location: [facility];  Service: Gastroenterology;  Laterality: N/A;   DIALYSIS/PERMA CATHETER INSERTION N/A 05/21/2018   Procedure: DIALYSIS/PERMA CATHETER INSERTION;  Surgeon: Marea Selinda RAMAN, MD;  Location: [facility];  Service: Cardiovascular;  Laterality: N/A;   DIALYSIS/PERMA CATHETER INSERTION N/A 07/14/2020   Procedure: DIALYSIS/PERMA CATHETER INSERTION;  Surgeon: Marea Selinda RAMAN, MD;  Location: [facility];  Service: Cardiovascular;  Laterality: N/A;   DIALYSIS/PERMA CATHETER REMOVAL N/A 04/11/2019   Procedure: DIALYSIS/PERMA CATHETER REMOVAL;  Surgeon: Marea Selinda RAMAN, MD;  Location: [facility];  Service: Cardiovascular;  Laterality: N/A;   EYE SURGERY Bilateral 2018   cataract extractions   IR IMAGING GUIDED PORT INSERTION  06/05/2020   THROMBECTOMY W/ EMBOLECTOMY  05/11/2018   Procedure: THROMBECTOMY ARTERIOVENOUS FISTULA;  Surgeon: Jama Cordella MATSU, MD;  Location: [facility];  Service: Vascular;;   TUBAL LIGATION  1984     reports that she has never smoked. She has never used smokeless tobacco. She reports that she does not drink alcohol and does not use drugs.  Allergies  Allergen Reactions   Amlodipine  Swelling    Knees down to ankles Knees down to ankles   Hctz [Hydrochlorothiazide]     pancreatitis   Heparin  Other (See Comments)    Hx  HIT,   Pt reports cardiac arrest when given heparin    Lmw Heparin      Reports cardiac arrest when given heparin    Other Other (See Comments)    Reports cardiac arrest when given during surgery Reports cardiac arrest when given during surgery Reports cardiac arrest when given during surgery    Sulfa Antibiotics Rash   Lactose    Dairy Aid [Tilactase]     Runny nose   Hydralazine      Nausea and rash    Family History  Problem Relation Age of Onset   Stroke Mother    Hypertension Mother    Gout Mother    Cancer Father    Diabetes Sister    Diabetes Maternal Grandmother     Diabetes Maternal Grandfather    Diabetes Paternal Grandmother    Diabetes Paternal Grandfather     Prior to Admission medications   Medication Sig Start Date End Date Taking? Authorizing Provider  acetaminophen  (TYLENOL ) 325 MG tablet Take 2 tablets (650 mg total) by mouth every 4 (four) hours as needed for mild pain (pain score 1-3) or fever (or temp > 37.5 C (99.5 F)). 04/02/24   Dorinda Drue DASEN, MD  aspirin  EC 81 MG tablet Take 1 tablet (81 mg total) by mouth daily. Swallow whole. 04/02/24   Dorinda Drue DASEN, MD  atorvastatin  (LIPITOR) 40 MG tablet Take 1 tablet (40 mg total) by mouth daily. 04/03/24   Dorinda Drue DASEN, MD  bisacodyl  (DULCOLAX) 5 MG EC tablet Take 1 tablet (5 mg total) by mouth daily as needed for moderate constipation. 04/02/24   Dorinda Drue DASEN, MD  carvedilol  (COREG ) 25 MG tablet Take 1 tablet (25 mg total) by mouth 2 (two) times daily with a meal. 04/01/22   Krishnan, Sendil K, MD  cloNIDine  (CATAPRES  - DOSED IN MG/24 HR) 0.3 mg/24hr patch Place 1 patch (0.3 mg total) onto the skin once a week. 04/07/24   Dorinda Drue DASEN, MD  diltiazem  (CARDIZEM  CD) 180 MG 24 hr capsule Take 1 capsule (180 mg total) by mouth daily. 04/03/24   Dorinda Drue DASEN, MD  hydrALAZINE  (APRESOLINE ) 25 MG tablet Take 1 tablet (25 mg total) by mouth every 6 (six) hours as needed (SBP >160). 04/02/24   Dorinda Drue DASEN, MD  losartan  (COZAAR ) 100 MG tablet SMARTSIG:1 Tablet(s) By Mouth Every Evening 02/03/22   [provider]  pantoprazole  (PROTONIX ) 40 MG tablet Take 1 tablet (40 mg total) by mouth daily. 04/02/24   Dorinda Drue DASEN, MD  QUEtiapine  (SEROQUEL ) 100 MG tablet Take 1 tablet (100 mg total) by mouth at bedtime. 04/02/24   Dorinda Drue DASEN, MD  QUEtiapine  (SEROQUEL ) 25 MG tablet Take 1 tablet (25 mg total) by mouth 3 (three) times daily. 04/02/24   Dorinda Drue DASEN, MD  QUEtiapine  (SEROQUEL ) 25 MG tablet Take 1 tablet (25 mg total) by mouth every 8 (eight) hours as needed (psychosis/anxiety). 04/02/24   Dorinda Drue DASEN, MD  spironolactone  (ALDACTONE ) 25 MG tablet Take 1 tablet (25 mg total) by mouth daily. 04/03/24   Dorinda Drue DASEN, MD  traZODone  (DESYREL ) 50 MG tablet Take 1 tablet (50 mg total) by mouth at bedtime. 04/02/24   Dorinda Drue DASEN, MD    Physical Exam: Vitals:   04/28/24 1350 04/28/24 1351 04/28/24 1530 04/28/24 1830  BP: (!) 124/55  (!) 145/47 (!) 139/47  Pulse: 65  65 64  Resp: 19  12 16   Temp: 98.1 F (36.7 C)   98.1 F (36.7 C)  SpO2: 99%  99% 100%  Weight:  51.7 kg      GENERAL: 68 y.o.-year-old black female patient, well-developed, well-nourished lying in the bed in no acute distress.  Pleasant and cooperative.   HEENT: Head atraumatic, normocephalic. Pupils equal. Mucus membranes moist. NECK: Supple. No JVD. CHEST: Normal breath sounds bilaterally. No wheezing, rales, rhonchi or crackles. No use of accessory muscles of respiration.  No reproducible chest wall tenderness.  CARDIOVASCULAR: S1, S2 normal. No murmurs, rubs, or gallops. Cap refill <2 seconds. Pulses intact distally.  ABDOMEN: Soft, nondistended, nontender. No rebound, guarding, rigidity. Normoactive bowel sounds present in all four quadrants.  EXTREMITIES: No pedal edema, cyanosis, or clubbing. No calf tenderness or Homan's sign.  NEUROLOGIC: The patient is alert and oriented x 3. Cranial nerves II through XII are grossly intact with no focal sensorimotor deficit. PSYCHIATRIC:  Normal affect, mood, thought content. SKIN: Warm, dry, and intact without obvious rash, lesion, or ulcer.    Labs on Admission:  CBC: Recent Labs  Lab 04/28/24 1505  WBC 15.1*  HGB 7.8*  HCT 25.7*  MCV 99.2  PLT 276   Basic Metabolic Panel: Recent Labs  Lab 04/28/24 1505  NA 138  K 3.4*  CL 98  CO2 24  GLUCOSE 59*  BUN 26*  CREATININE 5.08*  CALCIUM  8.5*   GFR: Estimated Creatinine Clearance: 8.9 mL/min (A) (by C-G formula based on SCr of 5.08 mg/dL (H)). Liver Function Tests: Recent Labs  Lab 04/28/24 1505   AST 13*  ALT 9  ALKPHOS 71  BILITOT 1.4*  PROT 6.5  ALBUMIN 2.3*   Recent Labs  Lab 04/28/24 1505  LIPASE 21   No results for input(s): AMMONIA in the last 168 hours. Coagulation Profile: No results for input(s): INR, PROTIME in the last 168 hours. Cardiac Enzymes: No results for input(s): CKTOTAL, CKMB, CKMBINDEX, TROPONINI in the last 168 hours. BNP (last 3 results) No results for input(s): PROBNP in  the last 8760 hours. HbA1C: No results for input(s): HGBA1C in the last 72 hours. CBG: No results for input(s): GLUCAP in the last 168 hours. Lipid Profile: No results for input(s): CHOL, HDL, LDLCALC, TRIG, CHOLHDL, LDLDIRECT in the last 72 hours. Thyroid Function Tests: No results for input(s): TSH, T4TOTAL, FREET4, T3FREE, THYROIDAB in the last 72 hours. Anemia Panel: No results for input(s): VITAMINB12, FOLATE, FERRITIN, TIBC, IRON, RETICCTPCT in the last 72 hours. Urine analysis:    Component Value Date/Time   COLORURINE YELLOW (A) 10/31/2017 1959   APPEARANCEUR HAZY (A) 10/31/2017 1959   APPEARANCEUR Hazy 12/09/2013 1046   LABSPEC 1.017 10/31/2017 1959   LABSPEC 1.031 12/09/2013 1046   PHURINE 6.0 10/31/2017 1959   GLUCOSEU >=500 (A) 10/31/2017 1959   GLUCOSEU >=500 12/09/2013 1046   HGBUR NEGATIVE 10/31/2017 1959   BILIRUBINUR NEGATIVE 10/31/2017 1959   BILIRUBINUR Negative 12/09/2013 1046   KETONESUR NEGATIVE 10/31/2017 1959   PROTEINUR >=300 (A) 10/31/2017 1959   NITRITE NEGATIVE 10/31/2017 1959   LEUKOCYTESUR NEGATIVE 10/31/2017 1959   LEUKOCYTESUR Negative 12/09/2013 1046   Sepsis Labs: @LABRCNTIP (procalcitonin:4,lacticidven:4) ) Recent Results (from the past 240 hours)  Gastrointestinal Panel by PCR , Stool     Status: None   Collection Time: 04/28/24  5:52 PM   Specimen: Stool  Result Value Ref Range Status   Campylobacter species NOT DETECTED NOT DETECTED Final   Plesimonas shigelloides NOT  DETECTED NOT DETECTED Final   Salmonella species NOT DETECTED NOT DETECTED Final   Yersinia enterocolitica NOT DETECTED NOT DETECTED Final   Vibrio species NOT DETECTED NOT DETECTED Final   Vibrio cholerae NOT DETECTED NOT DETECTED Final   Enteroaggregative E coli (EAEC) NOT DETECTED NOT DETECTED Final   Enteropathogenic E coli (EPEC) NOT DETECTED NOT DETECTED Final   Enterotoxigenic E coli (ETEC) NOT DETECTED NOT DETECTED Final   Shiga like toxin producing E coli (STEC) NOT DETECTED NOT DETECTED Final   Shigella/Enteroinvasive E coli (EIEC) NOT DETECTED NOT DETECTED Final   Cryptosporidium NOT DETECTED NOT DETECTED Final   Cyclospora cayetanensis NOT DETECTED NOT DETECTED Final   Entamoeba histolytica NOT DETECTED NOT DETECTED Final   Giardia lamblia NOT DETECTED NOT DETECTED Final   Adenovirus F40/41 NOT DETECTED NOT DETECTED Final   Astrovirus NOT DETECTED NOT DETECTED Final   Norovirus GI/GII NOT DETECTED NOT DETECTED Final   Rotavirus A NOT DETECTED NOT DETECTED Final   Sapovirus (I, II, IV, and V) NOT DETECTED NOT DETECTED Final    Comment: Performed at [facility], [address] [place], [place] [address]  C Difficile Quick Screen w PCR reflex     Status: Abnormal   Collection Time: 04/28/24  5:52 PM   Specimen: Stool  Result Value Ref Range Status   C Diff antigen POSITIVE (A) NEGATIVE Final   C Diff toxin NEGATIVE NEGATIVE Final   C Diff interpretation Results are indeterminate. See PCR results.  Final    Comment: Performed at [facility], [address] [place], [place] [address]  C. Diff by PCR, Reflexed     Status: Abnormal   Collection Time: 04/28/24  5:52 PM  Result Value Ref Range Status   Toxigenic C. Difficile by PCR POSITIVE (A) NEGATIVE Final    Comment: Positive for toxigenic C. difficile with little to no toxin production. Only treat if clinical presentation suggests symptomatic illness.   Hypervirulent Strain PRESUMPTIVE NEGATIVE  PRESUMPTIVE NEGATIVE Final    Comment: Performed at [facility], [address] [place], [place] [address]     Radiological Exams on Admission: CT ABDOMEN PELVIS WO CONTRAST Result Date: 04/28/2024 CLINICAL DATA:  Acute abdominal pain EXAM: CT ABDOMEN AND PELVIS WITHOUT CONTRAST TECHNIQUE: Multidetector CT imaging of the abdomen and pelvis was performed following the standard protocol without IV contrast. Unenhanced CT was performed per clinician order. Lack of IV contrast limits sensitivity and specificity, especially for evaluation of abdominal/pelvic solid viscera. RADIATION DOSE REDUCTION: This exam was performed according to the departmental dose-optimization program which includes automated exposure control, adjustment of the mA and/or kV according to patient size and/or use of iterative reconstruction technique. COMPARISON:  03/21/2024, 07/15/2022 FINDINGS: Lower chest: There are patchy areas of consolidation at the lung bases, left greater than right, favor atelectasis over airspace disease. Trace bilateral pleural effusions. Hepatobiliary: Cholecystectomy. Unremarkable unenhanced appearance of the liver. Pancreas: Unremarkable unenhanced appearance. Spleen: Unremarkable unenhanced appearance. Adrenals/Urinary Tract: Bilateral renal cortical atrophy. No urinary tract calculi or obstructive uropathy. The bladder is decompressed. The adrenals appear stable. Stomach/Bowel: No bowel obstruction or ileus. There is a large amount of retained stool throughout the colon compatible with constipation. Normal retrocecal appendix. No bowel wall thickening or inflammatory change. Vascular/Lymphatic: Diffuse atherosclerosis throughout the aorta and its branches. No pathologic adenopathy. Reproductive: Uterus is atrophic.  No adnexal masses. Other: No free fluid or free intraperitoneal gas. Musculoskeletal: Diffuse body wall edema consistent with anasarca. Since the prior exam, there has been progressive  invagination of the inferior endplate of the L4 vertebral body with impaction of the L5 vertebral body into the inferior endplate at L4. Moderate retrolisthesis of L5 relative to L4 as a result. Progressive facet hypertrophic changes at the L4-5 level. Stable spondylosis and facet hypertrophy at L5-S1. No acute fractures. Reconstructed images demonstrate no additional findings. IMPRESSION: 1. Chronic compression deformity of the inferior endplate of the L4 vertebral body, with impaction of the L5 vertebral body as above. This results in progressive retrolisthesis of L5 relative to L4 and worsening facet hypertrophic changes at that level. 2. Diffuse body wall edema consistent with anasarca. 3. Patchy bibasilar consolidation, left greater than right, favor atelectasis over airspace disease. 4. Trace bilateral pleural effusions. 5. Significant fecal retention throughout the colon compatible with constipation. No bowel obstruction or ileus. 6.  Aortic Atherosclerosis (ICD10-I70.0). Electronically Signed   By: Ozell Daring M.D.   On: 04/28/2024 16:05    EKG: Normal sinus rhythm at 64 bpm with normal axis and flattened T waves in lead III, aVF, V3 nonspecific ST-T wave changes.   Assessment/Plan  This is a 68 y.o. female with a history of diabetes, end-stage renal disease on dialysis Monday Wednesday Friday last session was 2 days ago, hypertension, iron deficiency anemia, hyperlipidemia, peripheral vascular disease, recurrent pancreatitis, dementia, TIA, cervical cancer  now being admitted with:  #.  C. difficile colitis - Admit inpatient - Continue Dificid  - Follow-up stool cultures - Enteric precautions - Pain control and antiemetics as needed  #.  Mild hypokalemia likely secondary to above - Replace orally  #.  Acute on chronic normocytic anemia - Check FOBT, iron panel, B12, folate levels - Transfuse as needed  #.  History of ESRD on dialysis Monday Wednesday Friday - Will consult  nephrology for ongoing care  #. History of PVD, TIA - Continue aspirin ,   #. History of hypertension - Continue clonidine , diltiazem , hydralazine , losartan , spironolactone , carvedilol   #.  History of hyperlipidemia - Continue atorvastatin   #. History of dementia - Continue quetiapine , trazodone   #.  History of GERD - Continue pantoprazole   #. History of diabetes - Accu-Cheks ACHS with ISS  Will need to reconcile remainder of home medications  Admission status: Inpatient IV Fluids: Normal saline Diet/Nutrition: Clear liquids advance as tolerated Consults called: Nephrology DVT Px: SCDs and early ambulation.  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 Management plans discussed with the patient and/or family who express understanding and agree with plan of care.  Meri Pelot D.O. on 04/28/2024 at 9:10 PM CC: Primary care physician; Zachary Idelia LABOR, MD   04/28/2024, 9:10 PM

## 2024-04-29 ENCOUNTER — Other Ambulatory Visit (HOSPITAL_COMMUNITY): Payer: Self-pay

## 2024-04-29 ENCOUNTER — Encounter: Payer: Self-pay | Admitting: Oncology

## 2024-04-29 ENCOUNTER — Telehealth (HOSPITAL_COMMUNITY): Payer: Self-pay | Admitting: Pharmacy Technician

## 2024-04-29 DIAGNOSIS — N186 End stage renal disease: Secondary | ICD-10-CM

## 2024-04-29 DIAGNOSIS — K219 Gastro-esophageal reflux disease without esophagitis: Secondary | ICD-10-CM

## 2024-04-29 DIAGNOSIS — F03918 Unspecified dementia, unspecified severity, with other behavioral disturbance: Secondary | ICD-10-CM | POA: Diagnosis not present

## 2024-04-29 DIAGNOSIS — D638 Anemia in other chronic diseases classified elsewhere: Secondary | ICD-10-CM | POA: Diagnosis not present

## 2024-04-29 DIAGNOSIS — I10 Essential (primary) hypertension: Secondary | ICD-10-CM

## 2024-04-29 DIAGNOSIS — E785 Hyperlipidemia, unspecified: Secondary | ICD-10-CM

## 2024-04-29 DIAGNOSIS — I739 Peripheral vascular disease, unspecified: Secondary | ICD-10-CM

## 2024-04-29 DIAGNOSIS — A0472 Enterocolitis due to Clostridium difficile, not specified as recurrent: Secondary | ICD-10-CM | POA: Diagnosis present

## 2024-04-29 DIAGNOSIS — E876 Hypokalemia: Secondary | ICD-10-CM

## 2024-04-29 LAB — CBC
HCT: 24.7 % — ABNORMAL LOW (ref 36.0–46.0)
Hemoglobin: 7.6 g/dL — ABNORMAL LOW (ref 12.0–15.0)
MCH: 29.9 pg (ref 26.0–34.0)
MCHC: 30.8 g/dL (ref 30.0–36.0)
MCV: 97.2 fL (ref 80.0–100.0)
Platelets: 281 K/uL (ref 150–400)
RBC: 2.54 MIL/uL — ABNORMAL LOW (ref 3.87–5.11)
RDW: 15.1 % (ref 11.5–15.5)
WBC: 15 K/uL — ABNORMAL HIGH (ref 4.0–10.5)
nRBC: 0 % (ref 0.0–0.2)

## 2024-04-29 LAB — IRON AND TIBC: Iron: 20 ug/dL — ABNORMAL LOW (ref 28–170)

## 2024-04-29 LAB — RENAL FUNCTION PANEL
Albumin: 2.3 g/dL — ABNORMAL LOW (ref 3.5–5.0)
Anion gap: 16 — ABNORMAL HIGH (ref 5–15)
BUN: 29 mg/dL — ABNORMAL HIGH (ref 8–23)
CO2: 25 mmol/L (ref 22–32)
Calcium: 8.4 mg/dL — ABNORMAL LOW (ref 8.9–10.3)
Chloride: 98 mmol/L (ref 98–111)
Creatinine, Ser: 5.05 mg/dL — ABNORMAL HIGH (ref 0.44–1.00)
GFR, Estimated: 9 mL/min — ABNORMAL LOW (ref >60)
Glucose, Bld: 63 mg/dL — ABNORMAL LOW (ref 70–99)
Phosphorus: 4.3 mg/dL (ref 2.5–4.6)
Potassium: 3.3 mmol/L — ABNORMAL LOW (ref 3.5–5.1)
Sodium: 139 mmol/L (ref 135–145)

## 2024-04-29 LAB — GLUCOSE, CAPILLARY
Glucose-Capillary: 103 mg/dL — ABNORMAL HIGH (ref 70–99)
Glucose-Capillary: 78 mg/dL (ref 70–99)
Glucose-Capillary: 92 mg/dL (ref 70–99)
Glucose-Capillary: 95 mg/dL (ref 70–99)

## 2024-04-29 LAB — COMPREHENSIVE METABOLIC PANEL WITH GFR
ALT: 6 U/L (ref 0–44)
AST: 10 U/L — ABNORMAL LOW (ref 15–41)
Albumin: 2.1 g/dL — ABNORMAL LOW (ref 3.5–5.0)
Alkaline Phosphatase: 65 U/L (ref 38–126)
Anion gap: 16 — ABNORMAL HIGH (ref 5–15)
BUN: 29 mg/dL — ABNORMAL HIGH (ref 8–23)
CO2: 23 mmol/L (ref 22–32)
Calcium: 8.2 mg/dL — ABNORMAL LOW (ref 8.9–10.3)
Chloride: 100 mmol/L (ref 98–111)
Creatinine, Ser: 5.39 mg/dL — ABNORMAL HIGH (ref 0.44–1.00)
GFR, Estimated: 8 mL/min — ABNORMAL LOW (ref >60)
Glucose, Bld: 91 mg/dL (ref 70–99)
Potassium: 3.9 mmol/L (ref 3.5–5.1)
Sodium: 139 mmol/L (ref 135–145)
Total Bilirubin: 0.9 mg/dL (ref 0.0–1.2)
Total Protein: 5.7 g/dL — ABNORMAL LOW (ref 6.5–8.1)

## 2024-04-29 LAB — MAGNESIUM: Magnesium: 2 mg/dL (ref 1.7–2.4)

## 2024-04-29 LAB — OCCULT BLOOD X 1 CARD TO LAB, STOOL: Fecal Occult Bld: NEGATIVE

## 2024-04-29 LAB — HEPATITIS B SURFACE ANTIGEN: Hepatitis B Surface Ag: NONREACTIVE

## 2024-04-29 LAB — FOLATE: Folate: 5.3 ng/mL — ABNORMAL LOW (ref >5.9)

## 2024-04-29 LAB — VITAMIN B12: Vitamin B-12: 643 pg/mL (ref 180–914)

## 2024-04-29 LAB — FERRITIN: Ferritin: 1494 ng/mL — ABNORMAL HIGH (ref 11–307)

## 2024-04-29 MED ORDER — DILTIAZEM HCL ER COATED BEADS 180 MG PO CP24
180.0000 mg | ORAL_CAPSULE | Freq: Every day | ORAL | Status: DC
  Administered 2024-04-29 – 2024-05-05 (×5): 180 mg via ORAL
  Filled 2024-04-29 (×7): qty 1

## 2024-04-29 MED ORDER — PANTOPRAZOLE SODIUM 40 MG PO TBEC
40.0000 mg | DELAYED_RELEASE_TABLET | Freq: Every day | ORAL | Status: DC
  Administered 2024-04-29 – 2024-05-05 (×5): 40 mg via ORAL
  Filled 2024-04-29 (×5): qty 1

## 2024-04-29 MED ORDER — CARVEDILOL 25 MG PO TABS
25.0000 mg | ORAL_TABLET | Freq: Two times a day (BID) | ORAL | Status: DC
  Administered 2024-04-29 – 2024-05-05 (×11): 25 mg via ORAL
  Filled 2024-04-29 (×11): qty 1

## 2024-04-29 MED ORDER — HYDRALAZINE HCL 25 MG PO TABS
25.0000 mg | ORAL_TABLET | Freq: Four times a day (QID) | ORAL | Status: DC | PRN
  Administered 2024-05-02 (×2): 25 mg via ORAL
  Filled 2024-04-29 (×2): qty 1

## 2024-04-29 MED ORDER — VANCOMYCIN HCL 125 MG PO CAPS
125.0000 mg | ORAL_CAPSULE | Freq: Four times a day (QID) | ORAL | Status: DC
  Administered 2024-04-29 – 2024-05-05 (×22): 125 mg via ORAL
  Filled 2024-04-29 (×29): qty 1

## 2024-04-29 MED ORDER — ATORVASTATIN CALCIUM 20 MG PO TABS
40.0000 mg | ORAL_TABLET | Freq: Every day | ORAL | Status: DC
  Administered 2024-04-29 – 2024-05-04 (×6): 40 mg via ORAL
  Filled 2024-04-29 (×6): qty 2

## 2024-04-29 MED ORDER — ASPIRIN 81 MG PO TBEC
81.0000 mg | DELAYED_RELEASE_TABLET | Freq: Every day | ORAL | Status: DC
  Administered 2024-04-30 – 2024-05-05 (×4): 81 mg via ORAL
  Filled 2024-04-29 (×4): qty 1

## 2024-04-29 MED ORDER — QUETIAPINE FUMARATE 25 MG PO TABS: 25.0000 mg | ORAL_TABLET | Freq: Three times a day (TID) | ORAL | Status: DC | PRN

## 2024-04-29 MED ORDER — CLONIDINE HCL 0.3 MG/24HR TD PTWK
0.3000 mg | MEDICATED_PATCH | TRANSDERMAL | Status: DC
  Administered 2024-04-29: 0.3 mg via TRANSDERMAL
  Filled 2024-04-29: qty 1

## 2024-04-29 MED ORDER — TRAZODONE HCL 50 MG PO TABS
50.0000 mg | ORAL_TABLET | Freq: Every day | ORAL | Status: DC
  Administered 2024-04-29 – 2024-05-04 (×6): 50 mg via ORAL
  Filled 2024-04-29 (×6): qty 1

## 2024-04-29 MED ORDER — QUETIAPINE FUMARATE 25 MG PO TABS
25.0000 mg | ORAL_TABLET | Freq: Three times a day (TID) | ORAL | Status: DC
  Administered 2024-04-29 – 2024-05-05 (×16): 25 mg via ORAL
  Filled 2024-04-29 (×16): qty 1

## 2024-04-29 MED ORDER — SPIRONOLACTONE 25 MG PO TABS
25.0000 mg | ORAL_TABLET | Freq: Every day | ORAL | Status: DC
  Administered 2024-04-29 – 2024-05-05 (×5): 25 mg via ORAL
  Filled 2024-04-29 (×5): qty 1

## 2024-04-29 MED ORDER — QUETIAPINE FUMARATE 25 MG PO TABS
100.0000 mg | ORAL_TABLET | Freq: Every day | ORAL | Status: DC
  Administered 2024-04-29 – 2024-05-04 (×6): 100 mg via ORAL
  Filled 2024-04-29 (×6): qty 4

## 2024-04-29 NOTE — Progress Notes (Signed)
 Progress Note   Patient: Tracy Jennings FMW:[id number] DOB: 24-Jul-1956 DOA: 04/28/2024     1 DOS: the patient was seen and examined on 04/29/2024   Brief hospital course: 68 y.o. female with a known history of diabetes, end-stage renal disease on dialysis Monday Wednesday Friday last session was 2 days ago, hypertension, iron deficiency anemia, hyperlipidemia, peripheral vascular disease, recurrent pancreatitis, dementia, TIA, cervical cancer previously on chemotherapy presents to the emergency department for evaluation of abdominal pain.  Patient was in a usual state of health until 5 days ago she reports the onset of abdominal pain described as generalized, associated with nausea and diarrhea..  She reports that her abdominal pain is entirely resolved.   Of note patient was taking an unknown antibiotics for a possible pneumonia.  She was admitted in July for hypertensive emergency and altered mental status.   Patient denies fevers/chills, weakness, dizziness, chest pain, shortness of breath, dysuria/frequency, changes in mental status.    Otherwise there has been no change in status. Patient has been taking medication as prescribed and there has been no recent change in medication or diet.  No recent antibiotics.  There has been no recent illness, hospitalizations, travel or sick contacts.     EMS/ED Course: Patient received fidaxomicin , lactated Ringer 's. Medical admission has been requested for further management of C. difficile colitis.  8/18.  Dificid  will be too expensive for patient upon getting out of the hospital.  Switch over to p.o. vancomycin .  Watch for further diarrhea.  Patient will have dialysis today.  Assessment and Plan: * C. difficile colitis Dificid  will be too expensive for patient.  Will switch over to p.o. vancomycin .  Advance diet to full liquid diet for lunch and solid food for dinner.  Dementia with behavioral disturbance (HCC) On Seroquel  and trazodone   ESRD  (end stage renal disease) [facility]) Nephrology consulted for dialysis.  Anemia of chronic disease Hemoglobin 7.6.  Many of them needing blood transfusion.  Check hemoglobin again tomorrow.  Hyperlipidemia On atorvastatin   GERD (gastroesophageal reflux disease) On PPI  PVD (peripheral vascular disease) (HCC) On aspirin   Hypokalemia Replaced  Essential hypertension On clonidine , Cardizem , hydralazine , losartan , spironolactone  and Coreg         Subjective: Patient normally has constipation admitted with diarrhea C. difficile positive and started on Dificid  but will be too expensive for the patient and will switch over to p.o. vancomycin .  Patient does have some abdominal pain.  Some nausea.  Some diarrhea.  Physical Exam: Vitals:   04/28/24 2237 04/29/24 0423 04/29/24 0500 04/29/24 0837  BP: (!) 142/55 (!) 179/52 (!) 191/45 (!) 158/39  Pulse: 66 68  68  Resp: 16 15  16   Temp: 98.2 F (36.8 C) 98.5 F (36.9 C)  98.4 F (36.9 C)  TempSrc: Oral   Oral  SpO2: 100% 99%  98%  Weight:  52.5 kg     Physical Exam HENT:     Head: Normocephalic.  Eyes:     General: Lids are normal.     Conjunctiva/sclera: Conjunctivae normal.  Cardiovascular:     Rate and Rhythm: Normal rate and regular rhythm.     Heart sounds: Normal heart sounds, S1 normal and S2 normal.  Pulmonary:     Breath sounds: No decreased breath sounds, wheezing, rhonchi or rales.  Abdominal:     Palpations: Abdomen is soft.     Tenderness: There is no abdominal tenderness.  Musculoskeletal:     Right lower leg: No swelling.  Left lower leg: No swelling.  Skin:    General: Skin is warm.     Findings: No rash.  Neurological:     Mental Status: She is alert.     Data Reviewed: Creatinine 5.38, white blood cell count 15.0, hemoglobin 7.6, platelet count 281  Family Communication: Brother at bedside  Disposition: Status is: Observation Patient will have dialysis today.  Monitor for further diarrhea.   Switch Dificid  over to p.o. vancomycin  and advance diet.  Planned Discharge Destination: Home with Home Health    Time spent: 28 minutes  Author: Charlie Patterson, MD 04/29/2024 1:34 PM  For on call review [email or web address].

## 2024-04-29 NOTE — Assessment & Plan Note (Signed)
 On Seroquel  and trazodone 

## 2024-04-29 NOTE — Progress Notes (Signed)
 [facility]  ROUNDING NOTE   Subjective:   Tracy Jennings is a 68 year old female with past medical conditions including dementia, hypertension, diabetes, hyperlipidemia, cervical cancer, recurrent pancreatitis, PVD, and end-stage renal disease on hemodialysis. Patient presents to the emergency department complaining of abd pain, weakness, nausea and diarrhea. She is admitted under observation for Generalized abdominal pain [R10.84] C. difficile diarrhea [A04.72] C. difficile colitis [A04.72]  Patient is known to our practice and receives outpatient dialysis treatments at [facility] on a MWF schedule, supervised by Dr. Dennise. Last treatment received on Friday. Patient seen laying in bed, brother at bedside. Alert, oriented and calm. Room air. No lower extremity edema.   Labs normal renal patient.  Hemoglobin decreased, 7.6 today.  C. difficile positive.  CT abdomen pelvis shows patchy bibasilar consolidation left greater than right, and trace bilateral pleural effusions.  Constipation also noted.  We have been consulted to manage dialysis needs.   Objective:  Vital signs in last 24 hours:  Temp:  [98.1 F (36.7 C)-98.5 F (36.9 C)] 98.4 F (36.9 C) (08/18 0837) Pulse Rate:  [64-68] 68 (08/18 0837) Resp:  [12-16] 16 (08/18 0837) BP: (139-191)/(39-55) 158/39 (08/18 0837) SpO2:  [98 %-100 %] 98 % (08/18 0837) Weight:  [52.5 kg] 52.5 kg (08/18 0423)  Weight change:  Filed Weights   04/28/24 1351 04/29/24 0423  Weight: 51.7 kg 52.5 kg    Intake/Output: I/O last 3 completed shifts: In: 445.2 [P.O.:300; I.V.:145.2] Out: -    Intake/Output this shift:  No intake/output data recorded.  Physical Exam: General: NAD  Head: Normocephalic, atraumatic. Moist oral mucosal membranes  Eyes: Anicteric  Neck: Supple  Lungs:  Clear to auscultation  Heart: Regular rate and rhythm  Abdomen:  Soft, nontender  Extremities: No peripheral edema.  Neurologic:  Awake, alert, conversant  Skin: Warm,dry, no rash  Access: Left AVG    Basic Metabolic Panel: Recent Labs  Lab 04/28/24 1505 04/28/24 2302 04/29/24 0511  NA 138 139 139  K 3.4* 3.3* 3.9  CL 98 98 100  CO2 24 25 23   GLUCOSE 59* 63* 91  BUN 26* 29* 29*  CREATININE 5.08* 5.05* 5.39*  CALCIUM  8.5* 8.4* 8.2*  PHOS  --  4.3  --     Liver Function Tests: Recent Labs  Lab 04/28/24 1505 04/28/24 2302 04/29/24 0511  AST 13*  --  10*  ALT 9  --  6  ALKPHOS 71  --  65  BILITOT 1.4*  --  0.9  PROT 6.5  --  5.7*  ALBUMIN 2.3* 2.3* 2.1*   Recent Labs  Lab 04/28/24 1505  LIPASE 21   No results for input(s): AMMONIA in the last 168 hours.  CBC: Recent Labs  Lab 04/28/24 1505 04/28/24 2302 04/29/24 0511  WBC 15.1* 13.8* 15.0*  HGB 7.8* 8.0* 7.6*  HCT 25.7* 26.7* 24.7*  MCV 99.2 99.6 97.2  PLT 276 204 281    Cardiac Enzymes: No results for input(s): CKTOTAL, CKMB, CKMBINDEX, TROPONINI in the last 168 hours.  BNP: Invalid input(s): POCBNP  CBG: Recent Labs  Lab 04/28/24 2244 04/28/24 2344 04/29/24 0040 04/29/24 0832 04/29/24 1137  GLUCAP 61* 63* 78 95 103*    Microbiology: Results for orders placed or performed during the hospital encounter of 04/28/24  Gastrointestinal Panel by PCR , Stool     Status: None   Collection Time: 04/28/24  5:52 PM   Specimen: Stool  Result Value Ref Range Status   Campylobacter  species NOT DETECTED NOT DETECTED Final   Plesimonas shigelloides NOT DETECTED NOT DETECTED Final   Salmonella species NOT DETECTED NOT DETECTED Final   Yersinia enterocolitica NOT DETECTED NOT DETECTED Final   Vibrio species NOT DETECTED NOT DETECTED Final   Vibrio cholerae NOT DETECTED NOT DETECTED Final   Enteroaggregative E coli (EAEC) NOT DETECTED NOT DETECTED Final   Enteropathogenic E coli (EPEC) NOT DETECTED NOT DETECTED Final   Enterotoxigenic E coli (ETEC) NOT DETECTED NOT DETECTED Final   Shiga like toxin producing E coli  (STEC) NOT DETECTED NOT DETECTED Final   Shigella/Enteroinvasive E coli (EIEC) NOT DETECTED NOT DETECTED Final   Cryptosporidium NOT DETECTED NOT DETECTED Final   Cyclospora cayetanensis NOT DETECTED NOT DETECTED Final   Entamoeba histolytica NOT DETECTED NOT DETECTED Final   Giardia lamblia NOT DETECTED NOT DETECTED Final   Adenovirus F40/41 NOT DETECTED NOT DETECTED Final   Astrovirus NOT DETECTED NOT DETECTED Final   Norovirus GI/GII NOT DETECTED NOT DETECTED Final   Rotavirus A NOT DETECTED NOT DETECTED Final   Sapovirus (I, II, IV, and V) NOT DETECTED NOT DETECTED Final    Comment: Performed at [facility], [address] [place], [place] [address]  C Difficile Quick Screen w PCR reflex     Status: Abnormal   Collection Time: 04/28/24  5:52 PM   Specimen: Stool  Result Value Ref Range Status   C Diff antigen POSITIVE (A) NEGATIVE Final   C Diff toxin NEGATIVE NEGATIVE Final   C Diff interpretation Results are indeterminate. See PCR results.  Final    Comment: Performed at [facility], [address] [place], [place] [address]  C. Diff by PCR, Reflexed     Status: Abnormal   Collection Time: 04/28/24  5:52 PM  Result Value Ref Range Status   Toxigenic C. Difficile by PCR POSITIVE (A) NEGATIVE Final    Comment: Positive for toxigenic C. difficile with little to no toxin production. Only treat if clinical presentation suggests symptomatic illness.   Hypervirulent Strain PRESUMPTIVE NEGATIVE PRESUMPTIVE NEGATIVE Final    Comment: Performed at [facility], [address] [place], [place] [address]    Coagulation Studies: No results for input(s): LABPROT, INR in the last 72 hours.  Urinalysis: No results for input(s): COLORURINE, LABSPEC, PHURINE, GLUCOSEU, HGBUR, BILIRUBINUR, KETONESUR, PROTEINUR, UROBILINOGEN, NITRITE, LEUKOCYTESUR in the last 72 hours.  Invalid input(s): APPERANCEUR    Imaging: CT ABDOMEN PELVIS  WO CONTRAST Result Date: 04/28/2024 CLINICAL DATA:  Acute abdominal pain EXAM: CT ABDOMEN AND PELVIS WITHOUT CONTRAST TECHNIQUE: Multidetector CT imaging of the abdomen and pelvis was performed following the standard protocol without IV contrast. Unenhanced CT was performed per clinician order. Lack of IV contrast limits sensitivity and specificity, especially for evaluation of abdominal/pelvic solid viscera. RADIATION DOSE REDUCTION: This exam was performed according to the departmental dose-optimization program which includes automated exposure control, adjustment of the mA and/or kV according to patient size and/or use of iterative reconstruction technique. COMPARISON:  03/21/2024, 07/15/2022 FINDINGS: Lower chest: There are patchy areas of consolidation at the lung bases, left greater than right, favor atelectasis over airspace disease. Trace bilateral pleural effusions. Hepatobiliary: Cholecystectomy. Unremarkable unenhanced appearance of the liver. Pancreas: Unremarkable unenhanced appearance. Spleen: Unremarkable unenhanced appearance. Adrenals/Urinary Tract: Bilateral renal cortical atrophy. No urinary tract calculi or obstructive uropathy. The bladder is decompressed. The adrenals appear stable. Stomach/Bowel: No bowel obstruction or ileus. There is a large amount of retained stool throughout the colon compatible with  constipation. Normal retrocecal appendix. No bowel wall thickening or inflammatory change. Vascular/Lymphatic: Diffuse atherosclerosis throughout the aorta and its branches. No pathologic adenopathy. Reproductive: Uterus is atrophic.  No adnexal masses. Other: No free fluid or free intraperitoneal gas. Musculoskeletal: Diffuse body wall edema consistent with anasarca. Since the prior exam, there has been progressive invagination of the inferior endplate of the L4 vertebral body with impaction of the L5 vertebral body into the inferior endplate at L4. Moderate retrolisthesis of L5 relative to  L4 as a result. Progressive facet hypertrophic changes at the L4-5 level. Stable spondylosis and facet hypertrophy at L5-S1. No acute fractures. Reconstructed images demonstrate no additional findings. IMPRESSION: 1. Chronic compression deformity of the inferior endplate of the L4 vertebral body, with impaction of the L5 vertebral body as above. This results in progressive retrolisthesis of L5 relative to L4 and worsening facet hypertrophic changes at that level. 2. Diffuse body wall edema consistent with anasarca. 3. Patchy bibasilar consolidation, left greater than right, favor atelectasis over airspace disease. 4. Trace bilateral pleural effusions. 5. Significant fecal retention throughout the colon compatible with constipation. No bowel obstruction or ileus. 6.  Aortic Atherosclerosis (ICD10-I70.0). Electronically Signed   By: Ozell Daring M.D.   On: 04/28/2024 16:05     Medications:     aspirin  EC  81 mg Oral Daily   atorvastatin   40 mg Oral QHS   carvedilol   25 mg Oral BID WC   Chlorhexidine  Gluconate Cloth  6 each Topical Q0600   cloNIDine   0.3 mg Transdermal Weekly   diltiazem   180 mg Oral Daily   insulin  aspart  0-5 Units Subcutaneous QHS   insulin  aspart  0-6 Units Subcutaneous TID WC   pantoprazole   40 mg Oral Daily   QUEtiapine   100 mg Oral QHS   QUEtiapine   25 mg Oral TID   sodium chloride  flush  3 mL Intravenous Q12H   spironolactone   25 mg Oral Daily   traZODone   50 mg Oral QHS   vancomycin   125 mg Oral QID   acetaminophen  **OR** acetaminophen , alteplase , heparin , hydrALAZINE , HYDROcodone -acetaminophen , lidocaine  (PF), lidocaine -prilocaine , morphine  injection, ondansetron  **OR** ondansetron  (ZOFRAN ) IV, pentafluoroprop-tetrafluoroeth, QUEtiapine   Assessment/ Plan:  Tracy Jennings is a 68 y.o.  female  with past medical conditions including dementia, hypertension, diabetes, hyperlipidemia, cervical cancer, recurrent pancreatitis, PVD, and end-stage renal disease on  hemodialysis. Patient presents to the emergency department with weakness, diarrhea and abd pain. She has been admitted for Generalized abdominal pain [R10.84] C. difficile diarrhea [A04.72] C. difficile colitis [A04.72]   [facility]/MWF/Lt AVG   C difficile colitits with positive antigen. Oral vancomycin . Continue supportive care.  2. End stage renal disease on hemodialysis. Last treatment received on Friday. Will receive treatment later today.   3. Anemia of chronic kidney disease Lab Results  Component Value Date   HGB 7.6 (L) 04/29/2024    Hgb decreased. Patient received Mircera at outpatient clinic.   4. Secondary Hyperparathyroidism: with outpatient labs: PTH 548, phosphorus 4.3, calcium  8.0 on 04/22/24.   Lab Results  Component Value Date   CALCIUM  8.2 (L) 04/29/2024   PHOS 4.3 04/28/2024    Will continue to monitor bone minerals.     LOS: 1 Ponce Skillman 8/18/20253:03 PM

## 2024-04-29 NOTE — Care Management CC44 (Signed)
 Condition Code 44 Documentation Completed  Patient Details  Name: Tracy Jennings MRN: [id number] Date of Birth: 02/22/1956   Condition Code 44 given:  Yes Patient signature on Condition Code 44 notice:  Yes Documentation of 2 MD's agreement:  Yes Code 44 added to claim:  Yes    Anessia Oakland, LCSW 04/29/2024, 11:59 AM

## 2024-04-29 NOTE — Assessment & Plan Note (Signed)
 On clonidine , Cardizem , hydralazine , losartan , spironolactone  and Coreg 

## 2024-04-29 NOTE — Plan of Care (Signed)
   Problem: Activity: Goal: Risk for activity intolerance will decrease Outcome: Progressing   Problem: Coping: Goal: Level of anxiety will decrease Outcome: Progressing   Problem: Pain Managment: Goal: General experience of comfort will improve and/or be controlled Outcome: Progressing   Problem: Safety: Goal: Ability to remain free from injury will improve Outcome: Progressing   Problem: Skin Integrity: Goal: Risk for impaired skin integrity will decrease Outcome: Progressing

## 2024-04-29 NOTE — Assessment & Plan Note (Signed)
 On PPI

## 2024-04-29 NOTE — Plan of Care (Signed)
   Problem: Health Behavior/Discharge Planning: Goal: Ability to manage health-related needs will improve Outcome: Progressing

## 2024-04-29 NOTE — Assessment & Plan Note (Signed)
 Hemoglobin 7.6.  Many of them needing blood transfusion.  Check hemoglobin again tomorrow.

## 2024-04-29 NOTE — Assessment & Plan Note (Signed)
 On atorvastatin

## 2024-04-29 NOTE — Progress Notes (Signed)
 At 2244 on 8/17 CBG 61, orange juice was given. At 2344, her CBG was 63, and apple juice was given. At 0040, her CBG was 78. Dr. Florie was informed.

## 2024-04-29 NOTE — Assessment & Plan Note (Signed)
 Dificid  will be too expensive for patient.  Will switch over to p.o. vancomycin .  Advance diet to full liquid diet for lunch and solid food for dinner.

## 2024-04-29 NOTE — Assessment & Plan Note (Signed)
 Replaced

## 2024-04-29 NOTE — Progress Notes (Signed)
  Received patient in bed to unit.   Informed consent signed and in chart.    TX duration:3:00     Transported by  Hand-off given to patient's nurse.    Access used: L upper arm Fistula Access issues: venous and arteria Pressures ran high   Total UF removed: 1 L Medication(s) given: none Post HD VS: wnl      GEANNIE Sar LPN [facility]

## 2024-04-29 NOTE — Assessment & Plan Note (Signed)
 On aspirin

## 2024-04-29 NOTE — Hospital Course (Signed)
 68 y.o. female with a known history of diabetes, end-stage renal disease on dialysis Monday Wednesday Friday last session was 2 days ago, hypertension, iron deficiency anemia, hyperlipidemia, peripheral vascular disease, recurrent pancreatitis, dementia, TIA, cervical cancer previously on chemotherapy presents to the emergency department for evaluation of abdominal pain.  Patient was in a usual state of health until 5 days ago she reports the onset of abdominal pain described as generalized, associated with nausea and diarrhea..  She reports that her abdominal pain is entirely resolved.   Of note patient was taking an unknown antibiotics for a possible pneumonia.  She was admitted in July for hypertensive emergency and altered mental status.   Patient denies fevers/chills, weakness, dizziness, chest pain, shortness of breath, dysuria/frequency, changes in mental status.    Otherwise there has been no change in status. Patient has been taking medication as prescribed and there has been no recent change in medication or diet.  No recent antibiotics.  There has been no recent illness, hospitalizations, travel or sick contacts.     EMS/ED Course: Patient received fidaxomicin , lactated Ringer 's. Medical admission has been requested for further management of C. difficile colitis.  8/18.  Dificid  will be too expensive for patient upon getting out of the hospital.  Switch over to p.o. vancomycin .  Watch for further diarrhea.  Patient will have dialysis today.

## 2024-04-29 NOTE — Assessment & Plan Note (Signed)
 Nephrology consulted for dialysis.

## 2024-04-29 NOTE — Progress Notes (Signed)
   04/29/24 1830  Vitals  Temp 98 F (36.7 C)  Temp Source Oral  BP (!) 122/53  MAP (mmHg) 160  BP Location Right Arm  BP Method Automatic  Patient Position (if appropriate) Lying  Pulse Rate 67  Pulse Rate Source Monitor  ECG Heart Rate 67  Oxygen Therapy  SpO2 99 %  During Treatment Monitoring  Blood Flow Rate (mL/min) 0 mL/min  Arterial Pressure (mmHg) -16.77 mmHg  Venous Pressure (mmHg) 202.41 mmHg  TMP (mmHg) 12.52 mmHg  Ultrafiltration Rate (mL/min) 449 mL/min  Dialysate Flow Rate (mL/min) 300 ml/min  Dialysate Potassium Concentration 3  Dialysate Calcium  Concentration 2.5  Duration of HD Treatment -hour(s) 2.8 hour(s)  Cumulative Fluid Removed (mL) per Treatment  909.9  HD Safety Checks Performed Yes  Intra-Hemodialysis Comments Tx completed  Post Treatment  Dialyzer Clearance Clear  Hemodialysis Intake (mL) 0 mL  Liters Processed 68.3  Fluid Removed (mL) 1000 mL  Tolerated HD Treatment Yes  Post-Hemodialysis Comments Tx (goal has been met. VS are stable)  AVG/AVF Arterial Site Held (minutes) 7 minutes  AVG/AVF Venous Site Held (minutes) 7 minutes  Fistula / Graft Left Upper arm Arteriovenous fistula  No placement date or time found.   Orientation: Left  Access Location: Upper arm  Access Type: Arteriovenous fistula  Site Condition No complications  Fistula / Graft Assessment Present;Thrill;Bruit  Status Deaccessed;Patent (Hemostasis achieved)  Drainage Description None

## 2024-04-29 NOTE — Telephone Encounter (Addendum)
 Patient Product/process development scientist completed.    The patient is insured through [facility]. Patient has Medicare and is not eligible for a copay card, but may be able to apply for patient assistance or Medicare RX Payment Plan (Patient Must reach out to their plan, if eligible for payment plan), if available.    Ran test claim for Dificid  200 mg and the current 10 day co-pay is $1,645.07.  Ran test claim for vancomycin  125 mg and the current 10 day co-pay is $108.96.   This test claim was processed through [facility]- copay amounts may vary at other pharmacies due to pharmacy/plan contracts, or as the patient moves through the different stages of their insurance plan.     Tracy Jennings, CPHT Pharmacy Technician III Certified Patient Advocate [facility] Patient Advocate Team Direct Number: [phone]  Fax: [phone]

## 2024-04-30 DIAGNOSIS — I10 Essential (primary) hypertension: Secondary | ICD-10-CM | POA: Diagnosis not present

## 2024-04-30 DIAGNOSIS — F03918 Unspecified dementia, unspecified severity, with other behavioral disturbance: Secondary | ICD-10-CM | POA: Diagnosis not present

## 2024-04-30 DIAGNOSIS — D638 Anemia in other chronic diseases classified elsewhere: Secondary | ICD-10-CM | POA: Diagnosis not present

## 2024-04-30 DIAGNOSIS — A0472 Enterocolitis due to Clostridium difficile, not specified as recurrent: Secondary | ICD-10-CM | POA: Diagnosis not present

## 2024-04-30 LAB — CBC
HCT: 24.7 % — ABNORMAL LOW (ref 36.0–46.0)
Hemoglobin: 7.5 g/dL — ABNORMAL LOW (ref 12.0–15.0)
MCH: 30.1 pg (ref 26.0–34.0)
MCHC: 30.4 g/dL (ref 30.0–36.0)
MCV: 99.2 fL (ref 80.0–100.0)
Platelets: 237 K/uL (ref 150–400)
RBC: 2.49 MIL/uL — ABNORMAL LOW (ref 3.87–5.11)
RDW: 15 % (ref 11.5–15.5)
WBC: 10.8 K/uL — ABNORMAL HIGH (ref 4.0–10.5)
nRBC: 0 % (ref 0.0–0.2)

## 2024-04-30 LAB — BASIC METABOLIC PANEL WITH GFR
Anion gap: 8 (ref 5–15)
BUN: 17 mg/dL (ref 8–23)
CO2: 29 mmol/L (ref 22–32)
Calcium: 8.2 mg/dL — ABNORMAL LOW (ref 8.9–10.3)
Chloride: 99 mmol/L (ref 98–111)
Creatinine, Ser: 3.73 mg/dL — ABNORMAL HIGH (ref 0.44–1.00)
GFR, Estimated: 13 mL/min — ABNORMAL LOW (ref >60)
Glucose, Bld: 79 mg/dL (ref 70–99)
Potassium: 3.8 mmol/L (ref 3.5–5.1)
Sodium: 136 mmol/L (ref 135–145)

## 2024-04-30 LAB — GLUCOSE, CAPILLARY
Glucose-Capillary: 80 mg/dL (ref 70–99)
Glucose-Capillary: 113 mg/dL — ABNORMAL HIGH (ref 70–99)
Glucose-Capillary: 99 mg/dL (ref 70–99)
Glucose-Capillary: 97 mg/dL (ref 70–99)

## 2024-04-30 LAB — PREPARE RBC (CROSSMATCH)

## 2024-04-30 MED ORDER — FUROSEMIDE 10 MG/ML IJ SOLN
60.0000 mg | Freq: Once | INTRAMUSCULAR | Status: AC
  Administered 2024-04-30: 60 mg via INTRAVENOUS
  Filled 2024-04-30: qty 8

## 2024-04-30 MED ORDER — SODIUM CHLORIDE 0.9% IV SOLUTION: Freq: Once | INTRAVENOUS | Status: AC

## 2024-04-30 NOTE — Progress Notes (Signed)
 Progress Note   Patient: Tracy Jennings FMW:[id number] DOB: [birth year] DOA: 04/28/2024     1 DOS: the patient was seen and examined on 04/30/2024   Brief hospital course: 68 y.o. female with a known history of diabetes, end-stage renal disease on dialysis Monday Wednesday Friday last session was 2 days ago, hypertension, iron deficiency anemia, hyperlipidemia, peripheral vascular disease, recurrent pancreatitis, dementia, TIA, cervical cancer previously on chemotherapy presents to the emergency department for evaluation of abdominal pain.  Patient was in a usual state of health until 5 days ago she reports the onset of abdominal pain described as generalized, associated with nausea and diarrhea..  She reports that her abdominal pain is entirely resolved.   Of note patient was taking an unknown antibiotics for a possible pneumonia.  She was admitted in July for hypertensive emergency and altered mental status.   Patient denies fevers/chills, weakness, dizziness, chest pain, shortness of breath, dysuria/frequency, changes in mental status.    Otherwise there has been no change in status. Patient has been taking medication as prescribed and there has been no recent change in medication or diet.  No recent antibiotics.  There has been no recent illness, hospitalizations, travel or sick contacts.     EMS/ED Course: Patient received fidaxomicin , lactated Ringer 's. Medical admission has been requested for further management of C. difficile colitis.  8/18.  Dificid  will be too expensive for patient upon getting out of the hospital.  Switch over to p.o. vancomycin .  Patient with 3 episodes of diarrhea.  Patient will have dialysis today. 8/19.  Hemoglobin down to 7.5.  Patient agreeable to blood transfusion.  Physical therapy recommending rehab.  Assessment and Plan: * C. difficile colitis Continue p.o. vancomycin .  Had 3 episodes of diarrhea yesterday.  Did not have diarrhea yet today.  Anemia of  chronic disease Hemoglobin 7.5.  Benefits and risk of blood transfusion explained to patient and sister at the bedside.  Will give 1 unit of packed red blood cells today.  Dementia with behavioral disturbance (HCC) On Seroquel  and trazodone   Essential hypertension On clonidine , Cardizem , hydralazine , losartan , spironolactone  and Coreg   ESRD (end stage renal disease) [facility] Nephrology dialysis yesterday.  Hyperlipidemia On atorvastatin   Hypokalemia Replaced  PVD (peripheral vascular disease) (HCC) On aspirin   GERD (gastroesophageal reflux disease) On PPI        Subjective: Patient complains of some abdominal pain.  Patient interested in going home but physical therapy recommending rehab.  Hemoglobin down to 7.5 today.  Admitted with C. difficile colitis  Physical Exam: Vitals:   04/29/24 1830 04/29/24 2100 04/30/24 0300 04/30/24 0351  BP: (!) 122/53 (!) 155/52 (!) 128/36 (!) 114/102  Pulse: 67 69 68 84  Resp:  20 19 17   Temp: 98 F (36.7 C) 98 F (36.7 C) 98 F (36.7 C) 98.3 F (36.8 C)  TempSrc: Oral Oral    SpO2: 99% 100% 97% 93%  Weight:       Physical Exam HENT:     Head: Normocephalic.  Eyes:     General: Lids are normal.     Comments: Conjunctiva pale  Cardiovascular:     Rate and Rhythm: Normal rate and regular rhythm.     Heart sounds: Normal heart sounds, S1 normal and S2 normal.  Pulmonary:     Breath sounds: No decreased breath sounds, wheezing, rhonchi or rales.  Abdominal:     Palpations: Abdomen is soft.     Tenderness: There is no abdominal tenderness.  Musculoskeletal:  Right lower leg: No swelling.     Left lower leg: No swelling.  Skin:    General: Skin is warm.     Findings: No rash.  Neurological:     Mental Status: She is alert.     Data Reviewed: Creatinine 3.73, hemoglobin 7.5, white blood cell count 10.8  Family Communication: Left message for primary contact sister.  Did speak with another sister at the  bedside.  Disposition: Status is: Observation Physical therapy recommending rehab.  Unable to reach to the daughter who will make that decision.  Planned Discharge Destination: Rehab    Time spent: 28 minutes  Author: Charlie Patterson, MD 04/30/2024 2:57 PM  For on call review [email or web address].

## 2024-04-30 NOTE — Progress Notes (Signed)
 [facility]  ROUNDING NOTE   Subjective:   Tracy Jennings is a 68 year old female with past medical conditions including dementia, hypertension, diabetes, hyperlipidemia, cervical cancer, recurrent pancreatitis, PVD, and end-stage renal disease on hemodialysis. Patient presents to the emergency department complaining of abd pain, weakness, nausea and diarrhea. She is admitted under observation for Generalized abdominal pain [R10.84] C. difficile diarrhea [A04.72] C. difficile colitis [A04.72]  Patient is known to our practice and receives outpatient dialysis treatments at [facility] on a MWF schedule, supervised by Dr. Dennise.  Patient seen sitting up in bed No family present at bedside Patient is alert Awaiting breakfast Room air   Objective:  Vital signs in last 24 hours:  Temp:  [97.9 F (36.6 C)-98.3 F (36.8 C)] 98.3 F (36.8 C) (08/19 0351) Pulse Rate:  [65-84] 84 (08/19 0351) Resp:  [17-20] 17 (08/19 0351) BP: (107-180)/(36-102) 114/102 (08/19 0351) SpO2:  [93 %-100 %] 93 % (08/19 0351) Weight:  [53.8 kg] 53.8 kg (08/18 1510)  Weight change: 2.09 kg Filed Weights   04/28/24 1351 04/29/24 0423 04/29/24 1510  Weight: 51.7 kg 52.5 kg 53.8 kg    Intake/Output: I/O last 3 completed shifts: In: 545.2 [P.O.:400; I.V.:145.2] Out: 1000 [Other:1000]   Intake/Output this shift:  No intake/output data recorded.  Physical Exam: General: NAD  Head: Normocephalic, atraumatic. Moist oral mucosal membranes  Eyes: Anicteric  Lungs:  Clear to auscultation  Heart: Regular rate and rhythm  Abdomen:  Soft, nontender  Extremities: No peripheral edema.  Neurologic: Awake, alert, conversant  Skin: Warm,dry, no rash  Access: Left AVG    Basic Metabolic Panel: Recent Labs  Lab 04/28/24 1505 04/28/24 2302 04/29/24 0511 04/30/24 0429  NA 138 139 139 136  K 3.4* 3.3* 3.9 3.8  CL 98 98 100 99  CO2 24 25 23 29   GLUCOSE 59* 63* 91 79  BUN 26* 29*  29* 17  CREATININE 5.08* 5.05* 5.39* 3.73*  CALCIUM  8.5* 8.4* 8.2* 8.2*  MG  --   --  2.0  --   PHOS  --  4.3  --   --     Liver Function Tests: Recent Labs  Lab 04/28/24 1505 04/28/24 2302 04/29/24 0511  AST 13*  --  10*  ALT 9  --  6  ALKPHOS 71  --  65  BILITOT 1.4*  --  0.9  PROT 6.5  --  5.7*  ALBUMIN 2.3* 2.3* 2.1*   Recent Labs  Lab 04/28/24 1505  LIPASE 21   No results for input(s): AMMONIA in the last 168 hours.  CBC: Recent Labs  Lab 04/28/24 1505 04/28/24 2302 04/29/24 0511 04/30/24 0429  WBC 15.1* 13.8* 15.0* 10.8*  HGB 7.8* 8.0* 7.6* 7.5*  HCT 25.7* 26.7* 24.7* 24.7*  MCV 99.2 99.6 97.2 99.2  PLT 276 204 281 237    Cardiac Enzymes: No results for input(s): CKTOTAL, CKMB, CKMBINDEX, TROPONINI in the last 168 hours.  BNP: Invalid input(s): POCBNP  CBG: Recent Labs  Lab 04/29/24 0832 04/29/24 1137 04/29/24 2102 04/30/24 0811 04/30/24 1154  GLUCAP 95 103* 92 80 113*    Microbiology: Results for orders placed or performed during the hospital encounter of 04/28/24  Gastrointestinal Panel by PCR , Stool     Status: None   Collection Time: 04/28/24  5:52 PM   Specimen: Stool  Result Value Ref Range Status   Campylobacter species NOT DETECTED NOT DETECTED Final   Plesimonas shigelloides NOT DETECTED NOT DETECTED Final  Salmonella species NOT DETECTED NOT DETECTED Final   Yersinia enterocolitica NOT DETECTED NOT DETECTED Final   Vibrio species NOT DETECTED NOT DETECTED Final   Vibrio cholerae NOT DETECTED NOT DETECTED Final   Enteroaggregative E coli (EAEC) NOT DETECTED NOT DETECTED Final   Enteropathogenic E coli (EPEC) NOT DETECTED NOT DETECTED Final   Enterotoxigenic E coli (ETEC) NOT DETECTED NOT DETECTED Final   Shiga like toxin producing E coli (STEC) NOT DETECTED NOT DETECTED Final   Shigella/Enteroinvasive E coli (EIEC) NOT DETECTED NOT DETECTED Final   Cryptosporidium NOT DETECTED NOT DETECTED Final   Cyclospora  cayetanensis NOT DETECTED NOT DETECTED Final   Entamoeba histolytica NOT DETECTED NOT DETECTED Final   Giardia lamblia NOT DETECTED NOT DETECTED Final   Adenovirus F40/41 NOT DETECTED NOT DETECTED Final   Astrovirus NOT DETECTED NOT DETECTED Final   Norovirus GI/GII NOT DETECTED NOT DETECTED Final   Rotavirus A NOT DETECTED NOT DETECTED Final   Sapovirus (I, II, IV, and V) NOT DETECTED NOT DETECTED Final    Comment: Performed at [facility], [address] [place], [place] [address]  C Difficile Quick Screen w PCR reflex     Status: Abnormal   Collection Time: 04/28/24  5:52 PM   Specimen: Stool  Result Value Ref Range Status   C Diff antigen POSITIVE (A) NEGATIVE Final   C Diff toxin NEGATIVE NEGATIVE Final   C Diff interpretation Results are indeterminate. See PCR results.  Final    Comment: Performed at [facility], [address] [place], [place] [address]  C. Diff by PCR, Reflexed     Status: Abnormal   Collection Time: 04/28/24  5:52 PM  Result Value Ref Range Status   Toxigenic C. Difficile by PCR POSITIVE (A) NEGATIVE Final    Comment: Positive for toxigenic C. difficile with little to no toxin production. Only treat if clinical presentation suggests symptomatic illness.   Hypervirulent Strain PRESUMPTIVE NEGATIVE PRESUMPTIVE NEGATIVE Final    Comment: Performed at [facility], [address] [place], [place] [address]    Coagulation Studies: No results for input(s): LABPROT, INR in the last 72 hours.  Urinalysis: No results for input(s): COLORURINE, LABSPEC, PHURINE, GLUCOSEU, HGBUR, BILIRUBINUR, KETONESUR, PROTEINUR, UROBILINOGEN, NITRITE, LEUKOCYTESUR in the last 72 hours.  Invalid input(s): APPERANCEUR    Imaging: CT ABDOMEN PELVIS WO CONTRAST Result Date: 04/28/2024 CLINICAL DATA:  Acute abdominal pain EXAM: CT ABDOMEN AND PELVIS WITHOUT CONTRAST TECHNIQUE: Multidetector CT imaging of the abdomen and  pelvis was performed following the standard protocol without IV contrast. Unenhanced CT was performed per clinician order. Lack of IV contrast limits sensitivity and specificity, especially for evaluation of abdominal/pelvic solid viscera. RADIATION DOSE REDUCTION: This exam was performed according to the departmental dose-optimization program which includes automated exposure control, adjustment of the mA and/or kV according to patient size and/or use of iterative reconstruction technique. COMPARISON:  03/21/2024, 07/15/2022 FINDINGS: Lower chest: There are patchy areas of consolidation at the lung bases, left greater than right, favor atelectasis over airspace disease. Trace bilateral pleural effusions. Hepatobiliary: Cholecystectomy. Unremarkable unenhanced appearance of the liver. Pancreas: Unremarkable unenhanced appearance. Spleen: Unremarkable unenhanced appearance. Adrenals/Urinary Tract: Bilateral renal cortical atrophy. No urinary tract calculi or obstructive uropathy. The bladder is decompressed. The adrenals appear stable. Stomach/Bowel: No bowel obstruction or ileus. There is a large amount of retained stool throughout the colon compatible with constipation. Normal retrocecal appendix. No bowel wall thickening or inflammatory change. Vascular/Lymphatic: Diffuse atherosclerosis throughout the aorta  and its branches. No pathologic adenopathy. Reproductive: Uterus is atrophic.  No adnexal masses. Other: No free fluid or free intraperitoneal gas. Musculoskeletal: Diffuse body wall edema consistent with anasarca. Since the prior exam, there has been progressive invagination of the inferior endplate of the L4 vertebral body with impaction of the L5 vertebral body into the inferior endplate at L4. Moderate retrolisthesis of L5 relative to L4 as a result. Progressive facet hypertrophic changes at the L4-5 level. Stable spondylosis and facet hypertrophy at L5-S1. No acute fractures. Reconstructed images  demonstrate no additional findings. IMPRESSION: 1. Chronic compression deformity of the inferior endplate of the L4 vertebral body, with impaction of the L5 vertebral body as above. This results in progressive retrolisthesis of L5 relative to L4 and worsening facet hypertrophic changes at that level. 2. Diffuse body wall edema consistent with anasarca. 3. Patchy bibasilar consolidation, left greater than right, favor atelectasis over airspace disease. 4. Trace bilateral pleural effusions. 5. Significant fecal retention throughout the colon compatible with constipation. No bowel obstruction or ileus. 6.  Aortic Atherosclerosis (ICD10-I70.0). Electronically Signed   By: Ozell Daring M.D.   On: 04/28/2024 16:05     Medications:     aspirin  EC  81 mg Oral Daily   atorvastatin   40 mg Oral QHS   carvedilol   25 mg Oral BID WC   Chlorhexidine  Gluconate Cloth  6 each Topical Q0600   cloNIDine   0.3 mg Transdermal Weekly   diltiazem   180 mg Oral Daily   insulin  aspart  0-5 Units Subcutaneous QHS   insulin  aspart  0-6 Units Subcutaneous TID WC   pantoprazole   40 mg Oral Daily   QUEtiapine   100 mg Oral QHS   QUEtiapine   25 mg Oral TID   sodium chloride  flush  3 mL Intravenous Q12H   spironolactone   25 mg Oral Daily   traZODone   50 mg Oral QHS   vancomycin   125 mg Oral QID   acetaminophen  **OR** acetaminophen , hydrALAZINE , HYDROcodone -acetaminophen , morphine  injection, ondansetron  **OR** ondansetron  (ZOFRAN ) IV, QUEtiapine   Assessment/ Plan:  Tracy Jennings is a 68 y.o.  female  with past medical conditions including dementia, hypertension, diabetes, hyperlipidemia, cervical cancer, recurrent pancreatitis, PVD, and end-stage renal disease on hemodialysis. Patient presents to the emergency department with weakness, diarrhea and abd pain. She has been admitted for Generalized abdominal pain [R10.84] C. difficile diarrhea [A04.72] C. difficile colitis [A04.72]   [facility]/MWF/Lt AVG    C difficile colitits with positive antigen. Oral vancomycin .  2. End stage renal disease on hemodialysis. Received dialysis yesterday, UF 1L achieved. Next treatment scheduled for Wednesday.   3. Anemia of chronic kidney disease Lab Results  Component Value Date   HGB 7.5 (L) 04/30/2024    Patient received Mircera at outpatient clinic. Will continue with inpatient dialysis, Retacrit  10000 units IV with dialysis.   4. Secondary Hyperparathyroidism: with outpatient labs: PTH 548, phosphorus 4.3, calcium  8.0 on 04/22/24.   Lab Results  Component Value Date   CALCIUM  8.2 (L) 04/30/2024   PHOS 4.3 04/28/2024    Will continue to monitor bone minerals.     LOS: 1 Meisha Salone 8/19/202512:32 PM

## 2024-04-30 NOTE — Progress Notes (Signed)
 1501 Blood consent signed and placed in chart. 1prbc unit started at this time  1516 Pt tolerated first . No transfusion reaction. Pt sister at bedside.

## 2024-04-30 NOTE — Progress Notes (Signed)
 Pt receives outpt HD at [facility] on MWF. Navigator following to assist with any HD needs.  Suzen Satchel Dialysis Navigator [phone]

## 2024-04-30 NOTE — Evaluation (Signed)
 Physical Therapy Evaluation Patient Details Name: Tracy Jennings MRN: [id number] DOB: [birth year] Today's Date: 04/30/2024  History of Present Illness  Patient is a 68 year old female with abdominal pain, weakness, diarrhea. C difficile colitits with positive antigen. PMH: dementia, HTN, DM, HLD, cervical cancer, recurrent pancreatitis, ESRD on HD.  Clinical Impression  Patient is lethargic during evaluation. She reports she was ambulatory with assistance and lives with her sister prior to this hospital stay.  Today the patient required physical assistance with most all mobility. She has poor sitting balance and required assistance to maintain midline. Partial stand complete with Max A required. She is fatigued with minimal activity. Anticipate patient will require caregiver support for safe return home. Otherwise need to consider rehabilitation < 3 hours/day after this hospital stay. PT will continue to follow to maximize independence and decrease caregiver burden.     If plan is discharge home, recommend the following: A lot of help with walking and/or transfers;A lot of help with bathing/dressing/bathroom;Assist for transportation;Help with stairs or ramp for entrance;Supervision due to cognitive status;Assistance with cooking/housework;Direct supervision/assist for medications management;Direct supervision/assist for financial management   Can travel by private vehicle   No    Equipment Recommendations None recommended by PT  Recommendations for Other Services       Functional Status Assessment Patient has had a recent decline in their functional status and demonstrates the ability to make significant improvements in function in a reasonable and predictable amount of time.     Precautions / Restrictions Precautions Precautions: Fall Recall of Precautions/Restrictions: Impaired Restrictions Weight Bearing Restrictions Per Provider Order: No      Mobility  Bed Mobility Overal  bed mobility: Needs Assistance Bed Mobility: Supine to Sit, Sit to Supine, Rolling Rolling: Supervision   Supine to sit: Mod assist Sit to supine: Mod assist   General bed mobility comments: assistance for trunk support to sit upright. assistance for BLE to return to bed. multi modal cues required    Transfers Overall transfer level: Needs assistance Equipment used: Rolling walker (2 wheels) Transfers: Sit to/from Stand Sit to Stand: Max assist           General transfer comment: patient only able to clear hips partially with standing with maximal assistance required. activity tolerance limited by weakness, fatigue with minimal activity, and lethargy    Ambulation/Gait               General Gait Details: unable to at this time  Stairs            Wheelchair Mobility     Tilt Bed    Modified Rankin (Stroke Patients Only)       Balance Overall balance assessment: Needs assistance Sitting-balance support: Feet supported Sitting balance-Leahy Scale: Poor Sitting balance - Comments: right side and anterior lean. external support required to maintain midline. patient is limited by lethargy Postural control: Right lateral lean                                   Pertinent Vitals/Pain Pain Assessment Pain Assessment: No/denies pain    Home Living Family/patient expects to be discharged to:: Private residence Living Arrangements: Other relatives (sister) Available Help at Discharge: Family;Available PRN/intermittently Type of Home: House Home Access: Stairs to enter Entrance Stairs-Rails: Doctor, general practice of Steps: 5   Home Layout: One level Home Equipment: Educational psychologist (4 wheels);Wheelchair - manual Additional Comments: patient  is a poor historian. some information from prior visit    Prior Function Prior Level of Function : Patient poor historian/Family not available             Mobility Comments: patient  reports short distance ambulation with rollator. she reports also having a wheelchair. presumably at least intermittent assistance required       Extremity/Trunk Assessment   Upper Extremity Assessment Upper Extremity Assessment: Generalized weakness    Lower Extremity Assessment Lower Extremity Assessment: Generalized weakness       Communication   Communication Communication: No apparent difficulties    Cognition Arousal: Lethargic Behavior During Therapy: Flat affect   PT - Cognitive impairments: History of cognitive impairments, Awareness, Attention, Memory, Initiation, Orientation, Sequencing, Problem solving   Orientation impairments: Time, Situation                     Following commands: Impaired Following commands impaired: Follows one step commands inconsistently     Cueing Cueing Techniques: Verbal cues, Gestural cues, Visual cues, Tactile cues     General Comments      Exercises     Assessment/Plan    PT Assessment Patient needs continued PT services  PT Problem List Decreased strength;Decreased range of motion;Decreased activity tolerance;Decreased balance;Decreased mobility;Decreased coordination;Decreased cognition;Decreased knowledge of use of DME;Decreased safety awareness;Decreased knowledge of precautions       PT Treatment Interventions DME instruction;Gait training;Stair training;Functional mobility training;Therapeutic activities;Therapeutic exercise;Balance training;Neuromuscular re-education;Cognitive remediation;Wheelchair mobility training;Patient/family education    PT Goals (Current goals can be found in the Care Plan section)  Acute Rehab PT Goals Patient Stated Goal: patient unable to participate with goal setting PT Goal Formulation: Patient unable to participate in goal setting Time For Goal Achievement: 05/14/24 Potential to Achieve Goals: Fair    Frequency Min 2X/week     Co-evaluation               AM-PAC  PT 6 Clicks Mobility  Outcome Measure Help needed turning from your back to your side while in a flat bed without using bedrails?: A Little Help needed moving from lying on your back to sitting on the side of a flat bed without using bedrails?: A Lot Help needed moving to and from a bed to a chair (including a wheelchair)?: Total Help needed standing up from a chair using your arms (e.g., wheelchair or bedside chair)?: Total Help needed to walk in hospital room?: Total Help needed climbing 3-5 steps with a railing? : Total 6 Click Score: 9    End of Session   Activity Tolerance: Patient limited by lethargy Patient left: in bed;with call bell/phone within reach;with bed alarm set Nurse Communication: Mobility status PT Visit Diagnosis: Muscle weakness (generalized) (M62.81);Unsteadiness on feet (R26.81)    Time: 9182-9164 PT Time Calculation (min) (ACUTE ONLY): 18 min   Charges:   PT Evaluation $PT Eval Moderate Complexity: 1 Mod   PT General Charges $$ ACUTE PT VISIT: 1 Visit         Tracy Jennings, PT, MPT   Tracy Jennings 04/30/2024, 9:50 AM

## 2024-05-01 DIAGNOSIS — A0472 Enterocolitis due to Clostridium difficile, not specified as recurrent: Secondary | ICD-10-CM | POA: Diagnosis not present

## 2024-05-01 LAB — TYPE AND SCREEN
ABO/RH(D): A POS
Antibody Screen: NEGATIVE
Unit division: 0

## 2024-05-01 LAB — BPAM RBC
Blood Product Expiration Date: 202509172359
ISSUE DATE / TIME: 202508191453
Unit Type and Rh: 6200

## 2024-05-01 LAB — GLUCOSE, CAPILLARY
Glucose-Capillary: 93 mg/dL (ref 70–99)
Glucose-Capillary: 96 mg/dL (ref 70–99)

## 2024-05-01 MED ORDER — LOSARTAN POTASSIUM 50 MG PO TABS
100.0000 mg | ORAL_TABLET | Freq: Every day | ORAL | Status: DC
  Administered 2024-05-02 – 2024-05-05 (×3): 100 mg via ORAL
  Filled 2024-05-01 (×3): qty 2

## 2024-05-01 MED ORDER — EPOETIN ALFA-EPBX 10000 UNIT/ML IJ SOLN
10000.0000 [IU] | INTRAMUSCULAR | Status: DC
  Administered 2024-05-01 – 2024-05-03 (×2): 10000 [IU] via INTRAVENOUS
  Filled 2024-05-01 (×2): qty 2

## 2024-05-01 MED ORDER — HYDRALAZINE HCL 20 MG/ML IJ SOLN
10.0000 mg | Freq: Once | INTRAMUSCULAR | Status: AC
  Administered 2024-05-01: 10 mg via INTRAVENOUS
  Filled 2024-05-01: qty 0.5

## 2024-05-01 NOTE — Progress Notes (Signed)
   05/01/24 1300  Vitals  Temp 97.6 F (36.4 C)  Temp Source Oral  BP (!) 202/62  MAP (mmHg) 100  BP Location Right Arm  BP Method Automatic  Pulse Rate 67  Pulse Rate Source Monitor  ECG Heart Rate 68  Resp 14  Weight 52.6 kg  Type of Weight Post-Dialysis  Oxygen Therapy  SpO2 100 %  During Treatment Monitoring  Blood Flow Rate (mL/min) 0 mL/min  Arterial Pressure (mmHg) 29.89 mmHg  Venous Pressure (mmHg) -33.13 mmHg  TMP (mmHg) 20 mmHg  Ultrafiltration Rate (mL/min) 401 mL/min  Dialysate Flow Rate (mL/min) 299 ml/min  Duration of HD Treatment -hour(s) 3.25 hour(s)  Cumulative Fluid Removed (mL) per Treatment  1000.06  Post Treatment  Dialyzer Clearance Clear  Hemodialysis Intake (mL) 0 mL  Liters Processed 67.3  Fluid Removed (mL) 1000 mL  Tolerated HD Treatment Yes  Post-Hemodialysis Comments tx completed high b/p noted NP notified  AVG/AVF Arterial Site Held (minutes) 7 minutes  AVG/AVF Venous Site Held (minutes) 7 minutes  Fistula / Graft Left Upper arm Arteriovenous fistula  No placement date or time found.   Orientation: Left  Access Location: Upper arm  Access Type: Arteriovenous fistula  Site Condition No complications  Fistula / Graft Assessment Present;Thrill;Bruit  Status Deaccessed  Drainage Description None   Received patient in bed to unit.  Alert and oriented.  Informed consent signed and in chart.   TX duration:3.15  Patient tolerated well.  Transported back to the room  Alert, without acute distress.  Hand-off given to patient's nurse.   Access used: RUA fistula Access issues: high VP  Total UF removed: 1000 Medication(s) given: hydralazine  10 mg Post HD VS: see above Post HD weight: 56kg   Doc[facility] CHRISTELLA [facility]

## 2024-05-01 NOTE — Progress Notes (Signed)
 Progress Note   Patient: Tracy Jennings FMW:[id number] DOB: 02-07-1956 DOA: 04/28/2024     1 DOS: the patient was seen and examined on 05/01/2024   Brief hospital course: 68 y.o. female with a known history of diabetes, end-stage renal disease on dialysis Monday Wednesday Friday last session was 2 days ago, hypertension, iron deficiency anemia, hyperlipidemia, peripheral vascular disease, recurrent pancreatitis, dementia, TIA, cervical cancer previously on chemotherapy presents to the emergency department for evaluation of abdominal pain.  Patient was in a usual state of health until 5 days ago she reports the onset of abdominal pain described as generalized, associated with nausea and diarrhea..  She reports that her abdominal pain is entirely resolved.   Of note patient was taking an unknown antibiotics for a possible pneumonia.  She was admitted in July for hypertensive emergency and altered mental status.   Patient denies fevers/chills, weakness, dizziness, chest pain, shortness of breath, dysuria/frequency, changes in mental status.    Otherwise there has been no change in status. Patient has been taking medication as prescribed and there has been no recent change in medication or diet.  No recent antibiotics.  There has been no recent illness, hospitalizations, travel or sick contacts.     EMS/ED Course: Patient received fidaxomicin , lactated Ringer 's. Medical admission has been requested for further management of C. difficile colitis.  8/18.  Dificid  will be too expensive for patient upon getting out of the hospital.  Switch over to p.o. vancomycin .  Patient with 3 episodes of diarrhea.  Patient will have dialysis today. 8/19.  Hemoglobin down to 7.5.  Patient agreeable to blood transfusion.  Physical therapy recommending rehab.  Assessment and Plan: * C. difficile colitis Continue p.o. vancomycin .  Had 3 episodes of diarrhea yesterday.  Day 3 of 10 of antibiotics.   Anemia of chronic  disease    Latest Ref Rng & Units 04/30/2024    4:29 AM 04/29/2024    5:11 AM 04/28/2024   11:02 PM  CBC  WBC 4.0 - 10.5 K/uL 10.8  15.0  13.8   Hemoglobin 12.0 - 15.0 g/dL 7.5  7.6  8.0   Hematocrit 36.0 - 46.0 % 24.7  24.7  26.7   Platelets 150 - 400 K/uL 237  281  204   Hgb stable. ESA per nephrology.    Dementia with behavioral disturbance (HCC) On Seroquel  and trazodone   Essential hypertension Remains poorly controlled.  On clonidine , Cardizem , hydralazine , losartan , spironolactone  and Coreg  at home. Will resume home losartan  in the AM.   ESRD (end stage renal disease) [facility]) Nephrology dialysis this AM .  Hyperlipidemia On atorvastatin   Hypokalemia Resolved.   PVD (peripheral vascular disease) (HCC) On aspirin   GERD (gastroesophageal reflux disease) On PPI      Subjective: Patient had 3 bowel movements.  Continues to have mild abdominal discomfort.  Physical Exam: Vitals:   05/01/24 1300 05/01/24 1332 05/01/24 1357 05/01/24 1713  BP: (!) 202/62 (!) 194/59 (!) 158/44 (!) 131/54  Pulse: 67  67 70  Resp: 14  16 16   Temp: 97.6 F (36.4 C)  97.9 F (36.6 C) 97.9 F (36.6 C)  TempSrc: Oral  Oral Oral  SpO2: 100%  99% 99%  Weight: 52.6 kg       Physical Exam  Constitutional: In no distress.  Cardiovascular: Normal rate, regular rhythm. Systolic murmur. No lower extremity edema  Pulmonary: Non labored breathing on room air, no wheezing or rales.   Abdominal: Soft. Normal bowel sounds. Non distended  and non tender Musculoskeletal: Normal range of motion.     Neurological: Alert and oriented to person, place, and time. Non focal  Skin: Skin is warm and dry.   Data Reviewed:    Latest Ref Rng & Units 04/30/2024    4:29 AM 04/29/2024    5:11 AM 04/28/2024   11:02 PM  BMP  Glucose 70 - 99 mg/dL 79  91  63   BUN 8 - 23 mg/dL 17  29  29    Creatinine 0.44 - 1.00 mg/dL 6.26  4.60  4.94   Sodium 135 - 145 mmol/L 136  139  139   Potassium 3.5 - 5.1 mmol/L 3.8   3.9  3.3   Chloride 98 - 111 mmol/L 99  100  98   CO2 22 - 32 mmol/L 29  23  25    Calcium  8.9 - 10.3 mg/dL 8.2  8.2  8.4       Latest Ref Rng & Units 04/30/2024    4:29 AM 04/29/2024    5:11 AM 04/28/2024   11:02 PM  CBC  WBC 4.0 - 10.5 K/uL 10.8  15.0  13.8   Hemoglobin 12.0 - 15.0 g/dL 7.5  7.6  8.0   Hematocrit 36.0 - 46.0 % 24.7  24.7  26.7   Platelets 150 - 400 K/uL 237  281  204    Iron/TIBC/Ferritin/ %Sat    Component Value Date/Time   IRON 20 (L) 04/29/2024 0511   TIBC NOT CALCULATED 04/29/2024 0511   FERRITIN 1,494 (H) 04/29/2024 0511   IRONPCTSAT NOT CALCULATED 04/29/2024 0511     Family Communication: Sister at bedside.   Disposition: Status is: Observation Physical therapy recommending rehab.  Planned Discharge Destination: Rehab    Time spent: 35 minutes  Author: Alban Pepper, MD 05/01/2024 5:38 PM  For on call review [email or web address].

## 2024-05-01 NOTE — Progress Notes (Signed)
 Physical Therapy Treatment Patient Details Name: Tracy Jennings MRN: [id number] DOB: 07/02/1956 Today's Date: 05/01/2024   History of Present Illness Patient is a 68 year old female with abdominal pain, weakness, diarrhea. C difficile colitits with positive antigen. PMH: dementia, HTN, DM, HLD, cervical cancer, recurrent pancreatitis, ESRD on HD.    PT Comments  Patient less lethargic today. Multi modal cues required with mobility tasks. Patient required Max A to stand with limited standing tolerance. She reports mostly only pivoting to the wheelchair at home. She is generally weak and will likely require continued caregiver support if returning home. Consider rehabilitation < 3 hours/day after this hospital stay. PT will continue to follow.    If plan is discharge home, recommend the following: A lot of help with walking and/or transfers;A lot of help with bathing/dressing/bathroom;Assist for transportation;Help with stairs or ramp for entrance;Supervision due to cognitive status;Assistance with cooking/housework;Direct supervision/assist for medications management;Direct supervision/assist for financial management   Can travel by private vehicle     No  Equipment Recommendations  None recommended by PT    Recommendations for Other Services       Precautions / Restrictions Precautions Precautions: Fall Recall of Precautions/Restrictions: Impaired Restrictions Weight Bearing Restrictions Per Provider Order: No     Mobility  Bed Mobility Overal bed mobility: Needs Assistance Bed Mobility: Sit to Supine, Supine to Sit     Supine to sit: Mod assist Sit to supine: Min assist   General bed mobility comments: cues for initiation and sequencing    Transfers Overall transfer level: Needs assistance Equipment used: Rolling walker (2 wheels) Transfers: Sit to/from Stand Sit to Stand: Max assist           General transfer comment: lifting assistance required. cues for  anterior weight shifting and initiation of standing.    Ambulation/Gait               General Gait Details: unable to. today she reports she only pivots to the wheelchair at home   Stairs             Wheelchair Mobility     Tilt Bed    Modified Rankin (Stroke Patients Only)       Balance Overall balance assessment: Needs assistance Sitting-balance support: Feet supported Sitting balance-Leahy Scale: Poor Sitting balance - Comments: initial left lateral lean with sitting with improved sitting balance with cues. Postural control: Right lateral lean Standing balance support: Bilateral upper extremity supported Standing balance-Leahy Scale: Poor Standing balance comment: heavy reliance on rolling walker for support in standing. right side lean. very limited standing tolerance                            Communication Communication Communication: No apparent difficulties  Cognition Arousal: Alert Behavior During Therapy: WFL for tasks assessed/performed   PT - Cognitive impairments: History of cognitive impairments, No family/caregiver present to determine baseline, Memory, Sequencing, Initiation                       PT - Cognition Comments: patient is less lethargic today. she needs encouragement to participate with mobility. multi modal cues required Following commands: Impaired Following commands impaired: Follows one step commands with increased time    Cueing Cueing Techniques: Verbal cues, Gestural cues, Tactile cues, Visual cues  Exercises      General Comments        Pertinent Vitals/Pain Pain Assessment Pain Assessment:  No/denies pain    Home Living                          Prior Function            PT Goals (current goals can now be found in the care plan section) Acute Rehab PT Goals Patient Stated Goal: none stated PT Goal Formulation: With patient Time For Goal Achievement: 05/14/24 Potential to  Achieve Goals: Fair Progress towards PT goals: Progressing toward goals    Frequency    Min 2X/week      PT Plan      Co-evaluation              AM-PAC PT 6 Clicks Mobility   Outcome Measure  Help needed turning from your back to your side while in a flat bed without using bedrails?: A Little Help needed moving from lying on your back to sitting on the side of a flat bed without using bedrails?: A Lot Help needed moving to and from a bed to a chair (including a wheelchair)?: Total Help needed standing up from a chair using your arms (e.g., wheelchair or bedside chair)?: Total Help needed to walk in hospital room?: Total Help needed climbing 3-5 steps with a railing? : Total 6 Click Score: 9    End of Session   Activity Tolerance: Patient limited by fatigue Patient left: in bed;with call bell/phone within reach;with bed alarm set (set-up to finish lunch) Nurse Communication: Mobility status PT Visit Diagnosis: Muscle weakness (generalized) (M62.81);Unsteadiness on feet (R26.81)     Time: 1449-1510 PT Time Calculation (min) (ACUTE ONLY): 21 min  Charges:    $Therapeutic Activity: 8-22 mins PT General Charges $$ ACUTE PT VISIT: 1 Visit                     Randine Essex, PT, MPT    Randine LULLA Essex 05/01/2024, 3:20 PM

## 2024-05-01 NOTE — Progress Notes (Signed)
 [facility]  ROUNDING NOTE   Subjective:   Tracy Jennings is a 68 year old female with past medical conditions including dementia, hypertension, diabetes, hyperlipidemia, cervical cancer, recurrent pancreatitis, PVD, and end-stage renal disease on hemodialysis. Patient presents to the emergency department complaining of abd pain, weakness, nausea and diarrhea. She is admitted under observation for Generalized abdominal pain [R10.84] C. difficile diarrhea [A04.72] C. difficile colitis [A04.72]  Patient is known to our practice and receives outpatient dialysis treatments at [facility] on a MWF schedule, supervised by Dr. Dennise.  Update: Patient seen and evaluated during dialysis   HEMODIALYSIS FLOWSHEET:  Blood Flow Rate (mL/min): 400 mL/min Arterial Pressure (mmHg): -170.09 mmHg Venous Pressure (mmHg): 271.1 mmHg TMP (mmHg): -5.86 mmHg Ultrafiltration Rate (mL/min): 554 mL/min Dialysate Flow Rate (mL/min): 300 ml/min Dialysis Fluid Bolus: Normal Saline Bolus Amount (mL): 300 mL  Resting quietly during treatment   Objective:  Vital signs in last 24 hours:  Temp:  [97.8 F (36.6 C)-98.6 F (37 C)] 98.6 F (37 C) (08/20 0915) Pulse Rate:  [60-71] 61 (08/20 1000) Resp:  [3-19] 3 (08/20 1000) BP: (134-186)/(48-72) 184/55 (08/20 1000) SpO2:  [96 %-100 %] 99 % (08/20 1000) Weight:  [53.6 kg] 53.6 kg (08/20 0915)  Weight change:  Filed Weights   04/29/24 0423 04/29/24 1510 05/01/24 0915  Weight: 52.5 kg 53.8 kg 53.6 kg    Intake/Output: I/O last 3 completed shifts: In: 641 [P.O.:150; I.V.:91; Blood:400] Out: -    Intake/Output this shift:  No intake/output data recorded.  Physical Exam: General: NAD  Head: Normocephalic, atraumatic. Moist oral mucosal membranes  Eyes: Anicteric  Lungs:  Clear to auscultation  Heart: Regular rate and rhythm  Abdomen:  Soft, nontender  Extremities: No peripheral edema.  Neurologic: Awake, alert,  conversant  Skin: Warm,dry, no rash  Access: Left AVG    Basic Metabolic Panel: Recent Labs  Lab 04/28/24 1505 04/28/24 2302 04/29/24 0511 04/30/24 0429  NA 138 139 139 136  K 3.4* 3.3* 3.9 3.8  CL 98 98 100 99  CO2 24 25 23 29   GLUCOSE 59* 63* 91 79  BUN 26* 29* 29* 17  CREATININE 5.08* 5.05* 5.39* 3.73*  CALCIUM  8.5* 8.4* 8.2* 8.2*  MG  --   --  2.0  --   PHOS  --  4.3  --   --     Liver Function Tests: Recent Labs  Lab 04/28/24 1505 04/28/24 2302 04/29/24 0511  AST 13*  --  10*  ALT 9  --  6  ALKPHOS 71  --  65  BILITOT 1.4*  --  0.9  PROT 6.5  --  5.7*  ALBUMIN 2.3* 2.3* 2.1*   Recent Labs  Lab 04/28/24 1505  LIPASE 21   No results for input(s): AMMONIA in the last 168 hours.  CBC: Recent Labs  Lab 04/28/24 1505 04/28/24 2302 04/29/24 0511 04/30/24 0429  WBC 15.1* 13.8* 15.0* 10.8*  HGB 7.8* 8.0* 7.6* 7.5*  HCT 25.7* 26.7* 24.7* 24.7*  MCV 99.2 99.6 97.2 99.2  PLT 276 204 281 237    Cardiac Enzymes: No results for input(s): CKTOTAL, CKMB, CKMBINDEX, TROPONINI in the last 168 hours.  BNP: Invalid input(s): POCBNP  CBG: Recent Labs  Lab 04/30/24 0811 04/30/24 1154 04/30/24 1657 04/30/24 2030 05/01/24 0810  GLUCAP 80 113* 99 97 93    Microbiology: Results for orders placed or performed during the hospital encounter of 04/28/24  Gastrointestinal Panel by PCR , Stool  Status: None   Collection Time: 04/28/24  5:52 PM   Specimen: Stool  Result Value Ref Range Status   Campylobacter species NOT DETECTED NOT DETECTED Final   Plesimonas shigelloides NOT DETECTED NOT DETECTED Final   Salmonella species NOT DETECTED NOT DETECTED Final   Yersinia enterocolitica NOT DETECTED NOT DETECTED Final   Vibrio species NOT DETECTED NOT DETECTED Final   Vibrio cholerae NOT DETECTED NOT DETECTED Final   Enteroaggregative E coli (EAEC) NOT DETECTED NOT DETECTED Final   Enteropathogenic E coli (EPEC) NOT DETECTED NOT DETECTED Final    Enterotoxigenic E coli (ETEC) NOT DETECTED NOT DETECTED Final   Shiga like toxin producing E coli (STEC) NOT DETECTED NOT DETECTED Final   Shigella/Enteroinvasive E coli (EIEC) NOT DETECTED NOT DETECTED Final   Cryptosporidium NOT DETECTED NOT DETECTED Final   Cyclospora cayetanensis NOT DETECTED NOT DETECTED Final   Entamoeba histolytica NOT DETECTED NOT DETECTED Final   Giardia lamblia NOT DETECTED NOT DETECTED Final   Adenovirus F40/41 NOT DETECTED NOT DETECTED Final   Astrovirus NOT DETECTED NOT DETECTED Final   Norovirus GI/GII NOT DETECTED NOT DETECTED Final   Rotavirus A NOT DETECTED NOT DETECTED Final   Sapovirus (I, II, IV, and V) NOT DETECTED NOT DETECTED Final    Comment: Performed at [facility], [address] [place], [place] [address]  C Difficile Quick Screen w PCR reflex     Status: Abnormal   Collection Time: 04/28/24  5:52 PM   Specimen: Stool  Result Value Ref Range Status   C Diff antigen POSITIVE (A) NEGATIVE Final   C Diff toxin NEGATIVE NEGATIVE Final   C Diff interpretation Results are indeterminate. See PCR results.  Final    Comment: Performed at [facility], [address] [place], [place] [address]  C. Diff by PCR, Reflexed     Status: Abnormal   Collection Time: 04/28/24  5:52 PM  Result Value Ref Range Status   Toxigenic C. Difficile by PCR POSITIVE (A) NEGATIVE Final    Comment: Positive for toxigenic C. difficile with little to no toxin production. Only treat if clinical presentation suggests symptomatic illness.   Hypervirulent Strain PRESUMPTIVE NEGATIVE PRESUMPTIVE NEGATIVE Final    Comment: Performed at [facility], [address] [place], [place] [address]    Coagulation Studies: No results for input(s): LABPROT, INR in the last 72 hours.  Urinalysis: No results for input(s): COLORURINE, LABSPEC, PHURINE, GLUCOSEU, HGBUR, BILIRUBINUR, KETONESUR, PROTEINUR, UROBILINOGEN, NITRITE,  LEUKOCYTESUR in the last 72 hours.  Invalid input(s): APPERANCEUR    Imaging: No results found.    Medications:     aspirin  EC  81 mg Oral Daily   atorvastatin   40 mg Oral QHS   carvedilol   25 mg Oral BID WC   Chlorhexidine  Gluconate Cloth  6 each Topical Q0600   cloNIDine   0.3 mg Transdermal Weekly   diltiazem   180 mg Oral Daily   insulin  aspart  0-5 Units Subcutaneous QHS   insulin  aspart  0-6 Units Subcutaneous TID WC   pantoprazole   40 mg Oral Daily   QUEtiapine   100 mg Oral QHS   QUEtiapine   25 mg Oral TID   sodium chloride  flush  3 mL Intravenous Q12H   spironolactone   25 mg Oral Daily   traZODone   50 mg Oral QHS   vancomycin   125 mg Oral QID   acetaminophen  **OR** acetaminophen , hydrALAZINE , HYDROcodone -acetaminophen , morphine  injection, ondansetron  **OR** ondansetron  (ZOFRAN ) IV, QUEtiapine   Assessment/ Plan:  Ms.  Tracy Jennings is a 68 y.o.  female  with past medical conditions including dementia, hypertension, diabetes, hyperlipidemia, cervical cancer, recurrent pancreatitis, PVD, and end-stage renal disease on hemodialysis. Patient presents to the emergency department with weakness, diarrhea and abd pain. She has been admitted for Generalized abdominal pain [R10.84] C. difficile diarrhea [A04.72] C. difficile colitis [A04.72]   [facility]/MWF/Lt AVG   C difficile colitits with positive antigen. Oral vancomycin . Continue supportive care  2. End stage renal disease on hemodialysis. Receiving dialysis today, UF 1L as tolerated. Next treatment scheduled for Friday.   3. Anemia of chronic kidney disease Lab Results  Component Value Date   HGB 7.5 (L) 04/30/2024    Patient received Mircera at outpatient clinic. Continue Retacrit  10000 units IV with dialysis.   4. Secondary Hyperparathyroidism: with outpatient labs: PTH 548, phosphorus 4.3, calcium  8.0 on 04/22/24.   Lab Results  Component Value Date   CALCIUM  8.2 (L) 04/30/2024   PHOS 4.3  04/28/2024    Calcium  and phos stable.     LOS: 1 Zenda Herskowitz 8/20/202510:36 AM

## 2024-05-02 DIAGNOSIS — A0472 Enterocolitis due to Clostridium difficile, not specified as recurrent: Secondary | ICD-10-CM | POA: Diagnosis not present

## 2024-05-02 LAB — CBC
HCT: 33.1 % — ABNORMAL LOW (ref 36.0–46.0)
Hemoglobin: 10.1 g/dL — ABNORMAL LOW (ref 12.0–15.0)
MCH: 30.5 pg (ref 26.0–34.0)
MCHC: 30.5 g/dL (ref 30.0–36.0)
MCV: 100 fL (ref 80.0–100.0)
Platelets: 208 K/uL (ref 150–400)
RBC: 3.31 MIL/uL — ABNORMAL LOW (ref 3.87–5.11)
RDW: 14.7 % (ref 11.5–15.5)
WBC: 11.7 K/uL — ABNORMAL HIGH (ref 4.0–10.5)
nRBC: 0 % (ref 0.0–0.2)

## 2024-05-02 LAB — GLUCOSE, CAPILLARY
Glucose-Capillary: 187 mg/dL — ABNORMAL HIGH (ref 70–99)
Glucose-Capillary: 98 mg/dL (ref 70–99)
Glucose-Capillary: 86 mg/dL (ref 70–99)

## 2024-05-02 MED ORDER — SODIUM CHLORIDE 0.9 % IV SOLN
1.0000 mg | Freq: Once | INTRAVENOUS | Status: AC
  Administered 2024-05-02: 1 mg via INTRAVENOUS
  Filled 2024-05-02: qty 0.2

## 2024-05-02 NOTE — Progress Notes (Signed)
 [facility]  ROUNDING NOTE   Subjective:   Tracy Jennings is a 68 year old female with past medical conditions including dementia, hypertension, diabetes, hyperlipidemia, cervical cancer, recurrent pancreatitis, PVD, and end-stage renal disease on hemodialysis. Patient presents to the emergency department complaining of abd pain, weakness, nausea and diarrhea. She is admitted under observation for Generalized abdominal pain [R10.84] C. difficile diarrhea [A04.72] C. difficile colitis [A04.72]  Patient is known to our practice and receives outpatient dialysis treatments at [facility] on a MWF schedule, supervised by Dr. Dennise.  Update:  Patient seen laying quietly in bed No family at bedside Denies pain Awaiting breakfast   Objective:  Vital signs in last 24 hours:  Temp:  [97.6 F (36.4 C)-98.3 F (36.8 C)] 98.3 F (36.8 C) (08/21 0844) Pulse Rate:  [62-76] 76 (08/21 0844) Resp:  [0-18] 17 (08/21 0844) BP: (127-202)/(44-92) 153/62 (08/21 0844) SpO2:  [97 %-100 %] 99 % (08/21 0844) Weight:  [52.6 kg] 52.6 kg (08/20 1300)  Weight change:  Filed Weights   04/29/24 1510 05/01/24 0915 05/01/24 1300  Weight: 53.8 kg 53.6 kg 52.6 kg    Intake/Output: I/O last 3 completed shifts: In: 22 [I.V.:91] Out: 1000 [Other:1000]   Intake/Output this shift:  No intake/output data recorded.  Physical Exam: General: NAD  Head: Normocephalic, atraumatic. Moist oral mucosal membranes  Eyes: Anicteric  Lungs:  Clear to auscultation  Heart: Regular rate and rhythm  Abdomen:  Soft, nontender  Extremities: No peripheral edema.  Neurologic: Awake, alert, conversant  Skin: Warm,dry, no rash  Access: Left AVG    Basic Metabolic Panel: Recent Labs  Lab 04/28/24 1505 04/28/24 2302 04/29/24 0511 04/30/24 0429  NA 138 139 139 136  K 3.4* 3.3* 3.9 3.8  CL 98 98 100 99  CO2 24 25 23 29   GLUCOSE 59* 63* 91 79  BUN 26* 29* 29* 17  CREATININE 5.08* 5.05*  5.39* 3.73*  CALCIUM  8.5* 8.4* 8.2* 8.2*  MG  --   --  2.0  --   PHOS  --  4.3  --   --     Liver Function Tests: Recent Labs  Lab 04/28/24 1505 04/28/24 2302 04/29/24 0511  AST 13*  --  10*  ALT 9  --  6  ALKPHOS 71  --  65  BILITOT 1.4*  --  0.9  PROT 6.5  --  5.7*  ALBUMIN 2.3* 2.3* 2.1*   Recent Labs  Lab 04/28/24 1505  LIPASE 21   No results for input(s): AMMONIA in the last 168 hours.  CBC: Recent Labs  Lab 04/28/24 1505 04/28/24 2302 04/29/24 0511 04/30/24 0429 05/02/24 0922  WBC 15.1* 13.8* 15.0* 10.8* 11.7*  HGB 7.8* 8.0* 7.6* 7.5* 10.1*  HCT 25.7* 26.7* 24.7* 24.7* 33.1*  MCV 99.2 99.6 97.2 99.2 100.0  PLT 276 204 281 237 208    Cardiac Enzymes: No results for input(s): CKTOTAL, CKMB, CKMBINDEX, TROPONINI in the last 168 hours.  BNP: Invalid input(s): POCBNP  CBG: Recent Labs  Lab 04/30/24 1154 04/30/24 1657 04/30/24 2030 05/01/24 0810 05/01/24 2101  GLUCAP 113* 99 97 93 96    Microbiology: Results for orders placed or performed during the hospital encounter of 04/28/24  Gastrointestinal Panel by PCR , Stool     Status: None   Collection Time: 04/28/24  5:52 PM   Specimen: Stool  Result Value Ref Range Status   Campylobacter species NOT DETECTED NOT DETECTED Final   Plesimonas shigelloides NOT DETECTED  NOT DETECTED Final   Salmonella species NOT DETECTED NOT DETECTED Final   Yersinia enterocolitica NOT DETECTED NOT DETECTED Final   Vibrio species NOT DETECTED NOT DETECTED Final   Vibrio cholerae NOT DETECTED NOT DETECTED Final   Enteroaggregative E coli (EAEC) NOT DETECTED NOT DETECTED Final   Enteropathogenic E coli (EPEC) NOT DETECTED NOT DETECTED Final   Enterotoxigenic E coli (ETEC) NOT DETECTED NOT DETECTED Final   Shiga like toxin producing E coli (STEC) NOT DETECTED NOT DETECTED Final   Shigella/Enteroinvasive E coli (EIEC) NOT DETECTED NOT DETECTED Final   Cryptosporidium NOT DETECTED NOT DETECTED Final    Cyclospora cayetanensis NOT DETECTED NOT DETECTED Final   Entamoeba histolytica NOT DETECTED NOT DETECTED Final   Giardia lamblia NOT DETECTED NOT DETECTED Final   Adenovirus F40/41 NOT DETECTED NOT DETECTED Final   Astrovirus NOT DETECTED NOT DETECTED Final   Norovirus GI/GII NOT DETECTED NOT DETECTED Final   Rotavirus A NOT DETECTED NOT DETECTED Final   Sapovirus (I, II, IV, and V) NOT DETECTED NOT DETECTED Final    Comment: Performed at [facility], [address] [place], [place] [address]  C Difficile Quick Screen w PCR reflex     Status: Abnormal   Collection Time: 04/28/24  5:52 PM   Specimen: Stool  Result Value Ref Range Status   C Diff antigen POSITIVE (A) NEGATIVE Final   C Diff toxin NEGATIVE NEGATIVE Final   C Diff interpretation Results are indeterminate. See PCR results.  Final    Comment: Performed at [facility], [address] [place], [place] [address]  C. Diff by PCR, Reflexed     Status: Abnormal   Collection Time: 04/28/24  5:52 PM  Result Value Ref Range Status   Toxigenic C. Difficile by PCR POSITIVE (A) NEGATIVE Final    Comment: Positive for toxigenic C. difficile with little to no toxin production. Only treat if clinical presentation suggests symptomatic illness.   Hypervirulent Strain PRESUMPTIVE NEGATIVE PRESUMPTIVE NEGATIVE Final    Comment: Performed at [facility], [address] [place], [place] [address]    Coagulation Studies: No results for input(s): LABPROT, INR in the last 72 hours.  Urinalysis: No results for input(s): COLORURINE, LABSPEC, PHURINE, GLUCOSEU, HGBUR, BILIRUBINUR, KETONESUR, PROTEINUR, UROBILINOGEN, NITRITE, LEUKOCYTESUR in the last 72 hours.  Invalid input(s): APPERANCEUR    Imaging: No results found.    Medications:     aspirin  EC  81 mg Oral Daily   atorvastatin   40 mg Oral QHS   carvedilol   25 mg Oral BID WC   Chlorhexidine  Gluconate Cloth  6 each  Topical Q0600   cloNIDine   0.3 mg Transdermal Weekly   diltiazem   180 mg Oral Daily   epoetin  alfa-epbx (RETACRIT ) injection  10,000 Units Intravenous Q M,W,F-1800   insulin  aspart  0-5 Units Subcutaneous QHS   insulin  aspart  0-6 Units Subcutaneous TID WC   losartan   100 mg Oral Daily   pantoprazole   40 mg Oral Daily   QUEtiapine   100 mg Oral QHS   QUEtiapine   25 mg Oral TID   sodium chloride  flush  3 mL Intravenous Q12H   spironolactone   25 mg Oral Daily   traZODone   50 mg Oral QHS   vancomycin   125 mg Oral QID   acetaminophen  **OR** acetaminophen , hydrALAZINE , HYDROcodone -acetaminophen , morphine  injection, ondansetron  **OR** ondansetron  (ZOFRAN ) IV, QUEtiapine   Assessment/ Plan:  [name] is a 68 y.o.  female  with past medical conditions including  dementia, hypertension, diabetes, hyperlipidemia, cervical cancer, recurrent pancreatitis, PVD, and end-stage renal disease on hemodialysis. Patient presents to the emergency department with weakness, diarrhea and abd pain. She has been admitted for Generalized abdominal pain [R10.84] C. difficile diarrhea [A04.72] C. difficile colitis [A04.72]   [facility]/MWF/Lt AVG   C difficile colitits with positive antigen. Will continue oral vancomycin  for 10 day course. Diarrhea episodes have decreased.   2. End stage renal disease on hemodialysis. Tolerated treatment well yesterday, UF 1L achieved.  Next treatment scheduled for Friday.   3. Anemia of chronic kidney disease Lab Results  Component Value Date   HGB 10.1 (L) 05/02/2024    Patient received Mircera at outpatient clinic. Continue Retacrit  10000 units IV with dialysis.   4. Secondary Hyperparathyroidism: with outpatient labs: PTH 548, phosphorus 4.3, calcium  8.0 on 04/22/24.   Lab Results  Component Value Date   CALCIUM  8.2 (L) 04/30/2024   PHOS 4.3 04/28/2024    Calcium  and phos stable. Will continue to monitor    LOS: 1 Biagio Snelson 8/21/202510:59 AM

## 2024-05-02 NOTE — Progress Notes (Signed)
 Progress Note   Patient: Tracy Jennings FMW:[id number] DOB: 1956-04-17 DOA: 04/28/2024     1 DOS: the patient was seen and examined on 05/02/2024   Brief hospital course: 68 y.o. female with a known history of diabetes, end-stage renal disease on dialysis Monday Wednesday Friday last session was 2 days ago, hypertension, iron deficiency anemia, hyperlipidemia, peripheral vascular disease, recurrent pancreatitis, dementia, TIA, cervical cancer previously on chemotherapy presents to the emergency department for evaluation of abdominal pain.  Patient was in a usual state of health until 5 days ago she reports the onset of abdominal pain described as generalized, associated with nausea and diarrhea..  She reports that her abdominal pain is entirely resolved.   Of note patient was taking an unknown antibiotics for a possible pneumonia.  She was admitted in July for hypertensive emergency and altered mental status.   Patient denies fevers/chills, weakness, dizziness, chest pain, shortness of breath, dysuria/frequency, changes in mental status.    Otherwise there has been no change in status. Patient has been taking medication as prescribed and there has been no recent change in medication or diet.  No recent antibiotics.  There has been no recent illness, hospitalizations, travel or sick contacts.     EMS/ED Course: Patient received fidaxomicin , lactated Ringer 's. Medical admission has been requested for further management of C. difficile colitis.  8/18.  Dificid  will be too expensive for patient upon getting out of the hospital.  Switch over to p.o. vancomycin .  Patient with 3 episodes of diarrhea.  Patient will have dialysis today. 8/19.  Hemoglobin down to 7.5.  Patient agreeable to blood transfusion.  Physical therapy recommending rehab.  Assessment and Plan: * C. difficile colitis Continue p.o. vancomycin .  Day 4 of 10 of antibiotics. 2 Bms recording yeseterday.   Anemia of chronic disease     Latest Ref Rng & Units 05/02/2024    9:22 AM 04/30/2024    4:29 AM 04/29/2024    5:11 AM  CBC  WBC 4.0 - 10.5 K/uL 11.7  10.8  15.0   Hemoglobin 12.0 - 15.0 g/dL 89.8  7.5  7.6   Hematocrit 36.0 - 46.0 % 33.1  24.7  24.7   Platelets 150 - 400 K/uL 208  237  281   Hgb stable. Possibly erroneously elevated. ESA per nephrology.  Iron/TIBC/Ferritin/ %Sat    Dementia with behavioral disturbance (HCC) On Seroquel  and trazodone   Essential hypertension Improved control On clonidine , Cardizem , hydralazine , losartan , spironolactone  and Coreg  at home.  Losartan  resume this a.m.  Continue to monitor  ESRD (end stage renal disease) (HCC) HD 8/21  Hyperlipidemia On atorvastatin   Hypokalemia Resolved.   PVD (peripheral vascular disease) (HCC) On aspirin   GERD (gastroesophageal reflux disease) On PPI      Subjective: Patient had 2 Bms.  No complaints.  Physical Exam: Vitals:   05/01/24 1944 05/02/24 0341 05/02/24 0631 05/02/24 0844  BP: (!) 142/59 (!) 166/62 (!) 134/57 (!) 153/62  Pulse: 74 74 75 76  Resp: 18   17  Temp: 97.9 F (36.6 C) 98.1 F (36.7 C)  98.3 F (36.8 C)  TempSrc: Oral Oral  Oral  SpO2: 100% 97%  99%  Weight:        Physical Exam  Constitutional: In no distress.  Sitting up in bed eating. Cardiovascular: Normal rate, regular rhythm. No lower extremity edema  Pulmonary: Non labored breathing on room air, no wheezing or rales.   Abdominal: Soft. Non distended and non tender Musculoskeletal: Normal range  of motion.     Neurological: Alert and oriented to person, place, but not time. Non focal  Skin: Skin is warm and dry.    Data Reviewed:    Latest Ref Rng & Units 04/30/2024    4:29 AM 04/29/2024    5:11 AM 04/28/2024   11:02 PM  BMP  Glucose 70 - 99 mg/dL 79  91  63   BUN 8 - 23 mg/dL 17  29  29    Creatinine 0.44 - 1.00 mg/dL 6.26  4.60  4.94   Sodium 135 - 145 mmol/L 136  139  139   Potassium 3.5 - 5.1 mmol/L 3.8  3.9  3.3   Chloride 98 -  111 mmol/L 99  100  98   CO2 22 - 32 mmol/L 29  23  25    Calcium  8.9 - 10.3 mg/dL 8.2  8.2  8.4       Latest Ref Rng & Units 05/02/2024    9:22 AM 04/30/2024    4:29 AM 04/29/2024    5:11 AM  CBC  WBC 4.0 - 10.5 K/uL 11.7  10.8  15.0   Hemoglobin 12.0 - 15.0 g/dL 89.8  7.5  7.6   Hematocrit 36.0 - 46.0 % 33.1  24.7  24.7   Platelets 150 - 400 K/uL 208  237  281    Iron/TIBC/Ferritin/ %Sat    Component Value Date/Time   IRON 20 (L) 04/29/2024 0511   TIBC NOT CALCULATED 04/29/2024 0511   FERRITIN 1,494 (H) 04/29/2024 0511   IRONPCTSAT NOT CALCULATED 04/29/2024 0511     Family Communication: Sister at bedside.   Disposition: Status is: Observation Physical therapy recommending rehab.  Planned Discharge Destination: Rehab    Time spent: 35 minutes  Author: Alban Pepper, MD 05/02/2024 4:07 PM  For on call review [email or web address].

## 2024-05-02 NOTE — Progress Notes (Signed)
 PT Cancellation Note  Patient Details Name: Tracy Jennings MRN: [id number] DOB: 1956/03/21   Cancelled Treatment:     PT attempt. Pt refused. Barely touched lunch tray in front of pt with pt's eyes closed. Supportive sister sitting at bedside.Sister also encouraged pt to participate. Pt remains unwilling. I'm just too tired and sleepy right now. Encouraged pt to participate, especially on non HD day. Pt remained firm on unwillingness to participate.   Rankin KATHEE Essex 05/02/2024, 2:25 PM

## 2024-05-02 NOTE — Plan of Care (Signed)

## 2024-05-03 DIAGNOSIS — A0472 Enterocolitis due to Clostridium difficile, not specified as recurrent: Secondary | ICD-10-CM | POA: Diagnosis not present

## 2024-05-03 LAB — RENAL FUNCTION PANEL
Albumin: 1.8 g/dL — ABNORMAL LOW (ref 3.5–5.0)
Anion gap: 11 (ref 5–15)
BUN: 27 mg/dL — ABNORMAL HIGH (ref 8–23)
CO2: 29 mmol/L (ref 22–32)
Calcium: 8.3 mg/dL — ABNORMAL LOW (ref 8.9–10.3)
Chloride: 95 mmol/L — ABNORMAL LOW (ref 98–111)
Creatinine, Ser: 5 mg/dL — ABNORMAL HIGH (ref 0.44–1.00)
GFR, Estimated: 9 mL/min — ABNORMAL LOW (ref >60)
Glucose, Bld: 124 mg/dL — ABNORMAL HIGH (ref 70–99)
Phosphorus: 2.6 mg/dL (ref 2.5–4.6)
Potassium: 3.7 mmol/L (ref 3.5–5.1)
Sodium: 135 mmol/L (ref 135–145)

## 2024-05-03 LAB — CBC
HCT: 29.5 % — ABNORMAL LOW (ref 36.0–46.0)
Hemoglobin: 9.2 g/dL — ABNORMAL LOW (ref 12.0–15.0)
MCH: 30.7 pg (ref 26.0–34.0)
MCHC: 31.2 g/dL (ref 30.0–36.0)
MCV: 98.3 fL (ref 80.0–100.0)
Platelets: 224 K/uL (ref 150–400)
RBC: 3 MIL/uL — ABNORMAL LOW (ref 3.87–5.11)
RDW: 14.7 % (ref 11.5–15.5)
WBC: 12.2 K/uL — ABNORMAL HIGH (ref 4.0–10.5)
nRBC: 0 % (ref 0.0–0.2)

## 2024-05-03 LAB — GLUCOSE, CAPILLARY
Glucose-Capillary: 109 mg/dL — ABNORMAL HIGH (ref 70–99)
Glucose-Capillary: 179 mg/dL — ABNORMAL HIGH (ref 70–99)

## 2024-05-03 MED ORDER — EPOETIN ALFA-EPBX 4000 UNIT/ML IJ SOLN: 4000.0000 [IU] | INTRAMUSCULAR | Status: DC

## 2024-05-03 MED ORDER — FOLIC ACID 1 MG PO TABS
1.0000 mg | ORAL_TABLET | Freq: Every day | ORAL | Status: DC
  Administered 2024-05-04 – 2024-05-05 (×2): 1 mg via ORAL
  Filled 2024-05-03 (×2): qty 1

## 2024-05-03 MED ORDER — BOOST PLUS PO LIQD
237.0000 mL | Freq: Three times a day (TID) | ORAL | Status: DC
  Administered 2024-05-04 – 2024-05-05 (×5): 237 mL via ORAL

## 2024-05-03 MED ORDER — EPOETIN ALFA-EPBX 10000 UNIT/ML IJ SOLN
INTRAMUSCULAR | Status: AC
  Filled 2024-05-03: qty 1

## 2024-05-03 NOTE — Progress Notes (Addendum)
  Received patient in bed to unit.   Informed consent signed and in chart.    TX duration:3.15     Transported by  Hand-off given to patient's nurse.    Access used: Left upper arm graft Access issues: high venous pressures   Total UF removed: 800 Medication(s) given: epo 10,000 u Post HD VS: wnl Post HD weight: 54.1 kg     N. Marisal Swarey LPN [facility]

## 2024-05-03 NOTE — Progress Notes (Signed)
 Progress Note   Patient: Tracy Jennings FMW:[id number] DOB: [birth year] DOA: 04/28/2024     1 DOS: the patient was seen and examined on 05/03/2024   Brief hospital course: 68 y.o. female with a known history of diabetes, end-stage renal disease on dialysis Monday Wednesday Friday last session was 2 days ago, hypertension, iron deficiency anemia, hyperlipidemia, peripheral vascular disease, recurrent pancreatitis, dementia, TIA, cervical cancer previously on chemotherapy presents to the emergency department for evaluation of abdominal pain.  Patient was in a usual state of health until 5 days ago she reports the onset of abdominal pain described as generalized, associated with nausea and diarrhea..  She reports that her abdominal pain is entirely resolved.   Of note patient was taking an unknown antibiotics for a possible pneumonia.  She was admitted in July for hypertensive emergency and altered mental status.   Patient denies fevers/chills, weakness, dizziness, chest pain, shortness of breath, dysuria/frequency, changes in mental status.    Otherwise there has been no change in status. Patient has been taking medication as prescribed and there has been no recent change in medication or diet.  No recent antibiotics.  There has been no recent illness, hospitalizations, travel or sick contacts.     EMS/ED Course: Patient received fidaxomicin , lactated Ringer 's. Medical admission has been requested for further management of C. difficile colitis.  8/18.  Dificid  will be too expensive for patient upon getting out of the hospital.  Switch over to p.o. vancomycin .  Patient with 3 episodes of diarrhea.  Patient will have dialysis today. 8/19.  Hemoglobin down to 7.5.  Patient agreeable to blood transfusion.  Physical therapy recommending rehab.  Assessment and Plan: * C. difficile colitis Continue p.o. vancomycin .  Day 5 of 10 of antibiotics.  1 Bm yesterday per patient.   Anemia of chronic  disease    Latest Ref Rng & Units 05/03/2024    8:01 AM 05/02/2024    9:22 AM 04/30/2024    4:29 AM  CBC  WBC 4.0 - 10.5 K/uL 12.2  11.7  10.8   Hemoglobin 12.0 - 15.0 g/dL 9.2  89.8  7.5   Hematocrit 36.0 - 46.0 % 29.5  33.1  24.7   Platelets 150 - 400 K/uL 224  208  237   Hgb stable. Possibly erroneously elevated. ESA per nephrology.  Iron/TIBC/Ferritin/ %Sat    Component Value Date/Time   IRON 20 (L) 04/29/2024 0511   TIBC NOT CALCULATED 04/29/2024 0511   FERRITIN 1,494 (H) 04/29/2024 0511   IRONPCTSAT NOT CALCULATED 04/29/2024 0511   Folate decreased. B12 WNL. Supplement folate  Dementia with behavioral disturbance (HCC) On Seroquel  and trazodone   Essential hypertension Improved control On clonidine , Cardizem , hydralazine , losartan , spironolactone  and Coreg  at home.      05/03/2024    5:33 PM 05/03/2024   11:30 AM 05/03/2024   11:00 AM  Vitals with BMI  Weight  119 lbs 4 oz   Systolic 124 118 878  Diastolic 39 49 43  Pulse 70 60 58   Continue to monitor  ESRD (end stage renal disease) (HCC) HD 8/21  Hyperlipidemia On atorvastatin     PVD (peripheral vascular disease) (HCC) On aspirin   GERD (gastroesophageal reflux disease) On PPI      Subjective: Remove 1 bowel movement overnight.  Physical Exam: Vitals:   05/03/24 1030 05/03/24 1100 05/03/24 1130 05/03/24 1733  BP: 131/72 (!) 121/43 (!) 118/49 (!) 124/39  Pulse: (!) 59 (!) 58 60 70  Resp: 12 13 11  Temp:   97.8 F (36.6 C) 97.9 F (36.6 C)  TempSrc:      SpO2: 99% 99% 100% 99%  Weight:   54.1 kg     Physical Exam  Constitutional: In no distress.  Cardiovascular: Normal rate, regular rhythm. No lower extremity edema  Pulmonary: Non labored breathing on room air, no wheezing or rales.   Abdominal: Soft. Non distended and non tender Musculoskeletal: Normal range of motion.     Neurological: Alert and oriented to person, place, and time. Non focal  Skin: Skin is warm and dry.    Data  Reviewed:    Latest Ref Rng & Units 05/03/2024    8:01 AM 04/30/2024    4:29 AM 04/29/2024    5:11 AM  BMP  Glucose 70 - 99 mg/dL 875  79  91   BUN 8 - 23 mg/dL 27  17  29    Creatinine 0.44 - 1.00 mg/dL 4.99  6.26  4.60   Sodium 135 - 145 mmol/L 135  136  139   Potassium 3.5 - 5.1 mmol/L 3.7  3.8  3.9   Chloride 98 - 111 mmol/L 95  99  100   CO2 22 - 32 mmol/L 29  29  23    Calcium  8.9 - 10.3 mg/dL 8.3  8.2  8.2       Latest Ref Rng & Units 05/03/2024    8:01 AM 05/02/2024    9:22 AM 04/30/2024    4:29 AM  CBC  WBC 4.0 - 10.5 K/uL 12.2  11.7  10.8   Hemoglobin 12.0 - 15.0 g/dL 9.2  89.8  7.5   Hematocrit 36.0 - 46.0 % 29.5  33.1  24.7   Platelets 150 - 400 K/uL 224  208  237    Iron/TIBC/Ferritin/ %Sat    Component Value Date/Time   IRON 20 (L) 04/29/2024 0511   TIBC NOT CALCULATED 04/29/2024 0511   FERRITIN 1,494 (H) 04/29/2024 0511   IRONPCTSAT NOT CALCULATED 04/29/2024 0511     Family Communication: None at bedside. Updated 8/21.   Disposition: Status is: Observation Physical therapy recommending rehab.  Planned Discharge Destination: Rehab Patient now declining rehab. Will go home with her family in the AM.     Time spent: 35 minutes  Author: Alban Pepper, MD 05/03/2024 6:40 PM  For on call review [email or web address].

## 2024-05-03 NOTE — Plan of Care (Signed)

## 2024-05-03 NOTE — Progress Notes (Signed)
 [facility]  ROUNDING NOTE   Subjective:   Tracy Jennings is a 68 year old female with past medical conditions including dementia, hypertension, diabetes, hyperlipidemia, cervical cancer, recurrent pancreatitis, PVD, and end-stage renal disease on hemodialysis. Patient presents to the emergency department complaining of abd pain, weakness, nausea and diarrhea. She is admitted under observation for Generalized abdominal pain [R10.84] C. difficile diarrhea [A04.72] C. difficile colitis [A04.72]  Patient is known to our practice and receives outpatient dialysis treatments at [facility] on a MWF schedule, supervised by Dr. Dennise.  Update:  Patient seen and evaluated during dialysis   HEMODIALYSIS FLOWSHEET:  Blood Flow Rate (mL/min): 299 mL/min Arterial Pressure (mmHg): -103.83 mmHg Venous Pressure (mmHg): 315.14 mmHg TMP (mmHg): 11.92 mmHg Ultrafiltration Rate (mL/min): 616 mL/min Dialysate Flow Rate (mL/min): 299 ml/min Dialysis Fluid Bolus: Normal Saline Bolus Amount (mL): 300 mL  Tolerating treatment  Resting quielty   Objective:  Vital signs in last 24 hours:  Temp:  [97.9 F (36.6 C)-98.6 F (37 C)] 98.1 F (36.7 C) (08/22 0738) Pulse Rate:  [54-71] 59 (08/22 1000) Resp:  [13-20] 13 (08/22 1000) BP: (69-194)/(32-66) 116/46 (08/22 1000) SpO2:  [94 %-100 %] 100 % (08/22 1000) Weight:  [51.6 kg] 51.6 kg (08/22 0735)  Weight change:  Filed Weights   05/01/24 0915 05/01/24 1300 05/03/24 0735  Weight: 53.6 kg 52.6 kg 51.6 kg    Intake/Output: I/O last 3 completed shifts: In: 465.7 [P.O.:420; IV Piggyback:45.7] Out: -    Intake/Output this shift:  No intake/output data recorded.  Physical Exam: General: NAD  Head: Normocephalic, atraumatic. Moist oral mucosal membranes  Eyes: Anicteric  Lungs:  Clear to auscultation  Heart: Regular rate and rhythm  Abdomen:  Soft, nontender  Extremities: No peripheral edema.  Neurologic: Awake,  alert, conversant  Skin: Warm,dry, no rash  Access: Left AVG    Basic Metabolic Panel: Recent Labs  Lab 04/28/24 1505 04/28/24 2302 04/29/24 0511 04/30/24 0429 05/03/24 0801  NA 138 139 139 136 135  K 3.4* 3.3* 3.9 3.8 3.7  CL 98 98 100 99 95*  CO2 24 25 23 29 29   GLUCOSE 59* 63* 91 79 124*  BUN 26* 29* 29* 17 27*  CREATININE 5.08* 5.05* 5.39* 3.73* 5.00*  CALCIUM  8.5* 8.4* 8.2* 8.2* 8.3*  MG  --   --  2.0  --   --   PHOS  --  4.3  --   --  2.6    Liver Function Tests: Recent Labs  Lab 04/28/24 1505 04/28/24 2302 04/29/24 0511 05/03/24 0801  AST 13*  --  10*  --   ALT 9  --  6  --   ALKPHOS 71  --  65  --   BILITOT 1.4*  --  0.9  --   PROT 6.5  --  5.7*  --   ALBUMIN 2.3* 2.3* 2.1* 1.8*   Recent Labs  Lab 04/28/24 1505  LIPASE 21   No results for input(s): AMMONIA in the last 168 hours.  CBC: Recent Labs  Lab 04/28/24 2302 04/29/24 0511 04/30/24 0429 05/02/24 0922 05/03/24 0801  WBC 13.8* 15.0* 10.8* 11.7* 12.2*  HGB 8.0* 7.6* 7.5* 10.1* 9.2*  HCT 26.7* 24.7* 24.7* 33.1* 29.5*  MCV 99.6 97.2 99.2 100.0 98.3  PLT 204 281 237 208 224    Cardiac Enzymes: No results for input(s): CKTOTAL, CKMB, CKMBINDEX, TROPONINI in the last 168 hours.  BNP: Invalid input(s): POCBNP  CBG: Recent Labs  Lab 05/01/24  9189 05/01/24 2101 05/02/24 1152 05/02/24 1633 05/02/24 2046  GLUCAP 93 96 187* 98 86    Microbiology: Results for orders placed or performed during the hospital encounter of 04/28/24  Gastrointestinal Panel by PCR , Stool     Status: None   Collection Time: 04/28/24  5:52 PM   Specimen: Stool  Result Value Ref Range Status   Campylobacter species NOT DETECTED NOT DETECTED Final   Plesimonas shigelloides NOT DETECTED NOT DETECTED Final   Salmonella species NOT DETECTED NOT DETECTED Final   Yersinia enterocolitica NOT DETECTED NOT DETECTED Final   Vibrio species NOT DETECTED NOT DETECTED Final   Vibrio cholerae NOT DETECTED  NOT DETECTED Final   Enteroaggregative E coli (EAEC) NOT DETECTED NOT DETECTED Final   Enteropathogenic E coli (EPEC) NOT DETECTED NOT DETECTED Final   Enterotoxigenic E coli (ETEC) NOT DETECTED NOT DETECTED Final   Shiga like toxin producing E coli (STEC) NOT DETECTED NOT DETECTED Final   Shigella/Enteroinvasive E coli (EIEC) NOT DETECTED NOT DETECTED Final   Cryptosporidium NOT DETECTED NOT DETECTED Final   Cyclospora cayetanensis NOT DETECTED NOT DETECTED Final   Entamoeba histolytica NOT DETECTED NOT DETECTED Final   Giardia lamblia NOT DETECTED NOT DETECTED Final   Adenovirus F40/41 NOT DETECTED NOT DETECTED Final   Astrovirus NOT DETECTED NOT DETECTED Final   Norovirus GI/GII NOT DETECTED NOT DETECTED Final   Rotavirus A NOT DETECTED NOT DETECTED Final   Sapovirus (I, II, IV, and V) NOT DETECTED NOT DETECTED Final    Comment: Performed at [facility], [address] [place], [place] [address]  C Difficile Quick Screen w PCR reflex     Status: Abnormal   Collection Time: 04/28/24  5:52 PM   Specimen: Stool  Result Value Ref Range Status   C Diff antigen POSITIVE (A) NEGATIVE Final   C Diff toxin NEGATIVE NEGATIVE Final   C Diff interpretation Results are indeterminate. See PCR results.  Final    Comment: Performed at [facility], [address] [place], [place] [address]  C. Diff by PCR, Reflexed     Status: Abnormal   Collection Time: 04/28/24  5:52 PM  Result Value Ref Range Status   Toxigenic C. Difficile by PCR POSITIVE (A) NEGATIVE Final    Comment: Positive for toxigenic C. difficile with little to no toxin production. Only treat if clinical presentation suggests symptomatic illness.   Hypervirulent Strain PRESUMPTIVE NEGATIVE PRESUMPTIVE NEGATIVE Final    Comment: Performed at [facility], [address] [place], [place] [address]    Coagulation Studies: No results for input(s): LABPROT, INR in the last 72 hours.  Urinalysis: No  results for input(s): COLORURINE, LABSPEC, PHURINE, GLUCOSEU, HGBUR, BILIRUBINUR, KETONESUR, PROTEINUR, UROBILINOGEN, NITRITE, LEUKOCYTESUR in the last 72 hours.  Invalid input(s): APPERANCEUR    Imaging: No results found.    Medications:     aspirin  EC  81 mg Oral Daily   atorvastatin   40 mg Oral QHS   carvedilol   25 mg Oral BID WC   Chlorhexidine  Gluconate Cloth  6 each Topical Q0600   cloNIDine   0.3 mg Transdermal Weekly   diltiazem   180 mg Oral Daily   epoetin  alfa-epbx (RETACRIT ) injection  10,000 Units Intravenous Q M,W,F-1800   insulin  aspart  0-5 Units Subcutaneous QHS   insulin  aspart  0-6 Units Subcutaneous TID WC   losartan   100 mg Oral Daily   pantoprazole   40 mg Oral Daily   QUEtiapine   100 mg Oral  QHS   QUEtiapine   25 mg Oral TID   sodium chloride  flush  3 mL Intravenous Q12H   spironolactone   25 mg Oral Daily   traZODone   50 mg Oral QHS   vancomycin   125 mg Oral QID   acetaminophen  **OR** acetaminophen , hydrALAZINE , HYDROcodone -acetaminophen , morphine  injection, ondansetron  **OR** ondansetron  (ZOFRAN ) IV, QUEtiapine   Assessment/ Plan:  Tracy Jennings is a 68 y.o.  female  with past medical conditions including dementia, hypertension, diabetes, hyperlipidemia, cervical cancer, recurrent pancreatitis, PVD, and end-stage renal disease on hemodialysis. Patient presents to the emergency department with weakness, diarrhea and abd pain. She has been admitted for Generalized abdominal pain [R10.84] C. difficile diarrhea [A04.72] C. difficile colitis [A04.72]   [facility]/MWF/Lt AVG   End stage renal disease on hemodialysis. Receiving dialysis today, UF 1.5L as tolerated. Next treatment scheduled for Monday.   C difficile colitits with positive antigen. Will continue oral vancomycin  for 10 day course.   3. Anemia of chronic kidney disease Lab Results  Component Value Date   HGB 9.2 (L) 05/03/2024    Patient received  Mircera at outpatient clinic. Will decrease to Retacrit  4000 units with next treatment.   4. Secondary Hyperparathyroidism: with outpatient labs: PTH 548, phosphorus 4.3, calcium  8.0 on 04/22/24.   Lab Results  Component Value Date   CALCIUM  8.3 (L) 05/03/2024   PHOS 2.6 05/03/2024    Will continue to monitor bone minerals during this admission.     LOS: 1 Rakel Junio 8/22/202510:54 AM

## 2024-05-04 ENCOUNTER — Observation Stay

## 2024-05-04 DIAGNOSIS — A0472 Enterocolitis due to Clostridium difficile, not specified as recurrent: Secondary | ICD-10-CM | POA: Diagnosis not present

## 2024-05-04 LAB — CBC WITH DIFFERENTIAL/PLATELET
Abs Immature Granulocytes: 0.16 K/uL — ABNORMAL HIGH (ref 0.00–0.07)
Basophils Absolute: 0.1 K/uL (ref 0.0–0.1)
Basophils Relative: 1 %
Eosinophils Absolute: 0.1 K/uL (ref 0.0–0.5)
Eosinophils Relative: 1 %
HCT: 32.4 % — ABNORMAL LOW (ref 36.0–46.0)
Hemoglobin: 9.9 g/dL — ABNORMAL LOW (ref 12.0–15.0)
Immature Granulocytes: 1 %
Lymphocytes Relative: 5 %
Lymphs Abs: 0.6 K/uL — ABNORMAL LOW (ref 0.7–4.0)
MCH: 30.4 pg (ref 26.0–34.0)
MCHC: 30.6 g/dL (ref 30.0–36.0)
MCV: 99.4 fL (ref 80.0–100.0)
Monocytes Absolute: 1.1 K/uL — ABNORMAL HIGH (ref 0.1–1.0)
Monocytes Relative: 9 %
Neutro Abs: 9.5 K/uL — ABNORMAL HIGH (ref 1.7–7.7)
Neutrophils Relative %: 83 %
Platelets: 220 K/uL (ref 150–400)
RBC: 3.26 MIL/uL — ABNORMAL LOW (ref 3.87–5.11)
RDW: 14.7 % (ref 11.5–15.5)
WBC: 11.5 K/uL — ABNORMAL HIGH (ref 4.0–10.5)
nRBC: 0 % (ref 0.0–0.2)

## 2024-05-04 LAB — GLUCOSE, CAPILLARY
Glucose-Capillary: 130 mg/dL — ABNORMAL HIGH (ref 70–99)
Glucose-Capillary: 83 mg/dL (ref 70–99)
Glucose-Capillary: 93 mg/dL (ref 70–99)

## 2024-05-04 NOTE — TOC Progression Note (Signed)
 Transition of Care [facility]) - Progression Note    Patient Details  Name: TAHEERA THOMANN MRN: [id number] Date of Birth: October 04, 1955  [facility] CM/SW Contact  [place] L M[place]iah [place], [place] Phone Number: 05/04/2024, 2:38 PM  Clinical Narrative:     CSW spoke with Shaun, [facility], to confirm services. CSW advised that patient is on their roster for services. Orders for Home Health in chart for PT.   CSW advised that patient is discharging today.                     Expected Discharge Plan and Services                                               Social Drivers of Health (SDOH) Interventions SDOH Screenings   Food Insecurity: No Food Insecurity (04/30/2024)  Recent Concern: Food Insecurity - Food Insecurity Present (04/24/2024)   Received from [facility]  Housing: Low Risk  (04/30/2024)  Transportation Needs: No Transportation Needs (04/30/2024)  Recent Concern: Transportation Needs - Unmet Transportation Needs (04/24/2024)   Received from [facility]  Utilities: Not At Risk (04/30/2024)  Financial Resource Strain: High Risk (04/24/2024)   Received from [facility]  Social Connections: Unknown (04/30/2024)  Tobacco Use: Low Risk  (04/28/2024)    Readmission Risk Interventions     No data to display

## 2024-05-04 NOTE — Progress Notes (Signed)
 Bed was alarming and patient was found face down in floor to the left side of bed. States she was reaching for her pillow in the floor which was her fall mat. Patient assessed and assisted back to bed x3 assist. Pt denies pain but says she may have hit her head. Neuro checks unremarkable. VS stable. No injuries noted. Patient is alert, but confused. Dr notified and new orders for CT in place. Bed alarm placed on highest sensitivity. Fall mats continue to be in place.

## 2024-05-04 NOTE — Plan of Care (Signed)
   Problem: Education: Goal: Knowledge of General Education information will improve Description: Including pain rating scale, medication(s)/side effects and non-pharmacologic comfort measures Outcome: Progressing   Problem: Clinical Measurements: Goal: Will remain free from infection Outcome: Progressing Goal: Diagnostic test results will improve Outcome: Progressing Goal: Respiratory complications will improve Outcome: Progressing

## 2024-05-04 NOTE — Plan of Care (Signed)
 Problem: Education: Goal: Knowledge of General Education information will improve Description: Including pain rating scale, medication(s)/side effects and non-pharmacologic comfort measures 05/04/2024 2302 by Teresa Jon HERO, RN Outcome: Progressing 05/04/2024 2301 by Teresa Jon HERO, RN Outcome: Progressing   Problem: Health Behavior/Discharge Planning: Goal: Ability to manage health-related needs will improve 05/04/2024 2302 by Teresa Jon HERO, RN Outcome: Progressing 05/04/2024 2301 by Teresa Jon HERO, RN Outcome: Progressing   Problem: Clinical Measurements: Goal: Ability to maintain clinical measurements within normal limits will improve 05/04/2024 2302 by Teresa Jon HERO, RN Outcome: Progressing 05/04/2024 2301 by Teresa Jon HERO, RN Outcome: Progressing Goal: Will remain free from infection 05/04/2024 2302 by Teresa Jon HERO, RN Outcome: Progressing 05/04/2024 2301 by Teresa Jon HERO, RN Outcome: Progressing Goal: Diagnostic test results will improve 05/04/2024 2302 by Teresa Jon HERO, RN Outcome: Progressing 05/04/2024 2301 by Teresa Jon HERO, RN Outcome: Progressing Goal: Respiratory complications will improve 05/04/2024 2302 by Teresa Jon HERO, RN Outcome: Progressing 05/04/2024 2301 by Teresa Jon HERO, RN Outcome: Progressing Goal: Cardiovascular complication will be avoided 05/04/2024 2302 by Teresa Jon HERO, RN Outcome: Progressing 05/04/2024 2301 by Teresa Jon HERO, RN Outcome: Progressing   Problem: Activity: Goal: Risk for activity intolerance will decrease 05/04/2024 2302 by Teresa Jon HERO, RN Outcome: Progressing 05/04/2024 2301 by Teresa Jon HERO, RN Outcome: Progressing   Problem: Nutrition: Goal: Adequate nutrition will be maintained 05/04/2024 2302 by Teresa Jon HERO, RN Outcome: Progressing 05/04/2024 2301 by Teresa Jon HERO, RN Outcome: Progressing   Problem: Coping: Goal: Level of anxiety will decrease 05/04/2024 2302 by Teresa Jon HERO, RN Outcome:  Progressing 05/04/2024 2301 by Teresa Jon HERO, RN Outcome: Progressing   Problem: Elimination: Goal: Will not experience complications related to bowel motility 05/04/2024 2302 by Teresa Jon HERO, RN Outcome: Progressing 05/04/2024 2301 by Teresa Jon HERO, RN Outcome: Progressing Goal: Will not experience complications related to urinary retention 05/04/2024 2302 by Teresa Jon HERO, RN Outcome: Progressing 05/04/2024 2301 by Teresa Jon HERO, RN Outcome: Progressing   Problem: Pain Managment: Goal: General experience of comfort will improve and/or be controlled 05/04/2024 2302 by Teresa Jon HERO, RN Outcome: Progressing 05/04/2024 2301 by Teresa Jon HERO, RN Outcome: Progressing   Problem: Safety: Goal: Ability to remain free from injury will improve 05/04/2024 2302 by Teresa Jon HERO, RN Outcome: Progressing 05/04/2024 2301 by Teresa Jon HERO, RN Outcome: Progressing   Problem: Skin Integrity: Goal: Risk for impaired skin integrity will decrease 05/04/2024 2302 by Teresa Jon HERO, RN Outcome: Progressing 05/04/2024 2301 by Teresa Jon HERO, RN Outcome: Progressing   Problem: Education: Goal: Ability to describe self-care measures that may prevent or decrease complications (Diabetes Survival Skills Education) will improve 05/04/2024 2302 by Teresa Jon HERO, RN Outcome: Progressing 05/04/2024 2301 by Teresa Jon HERO, RN Outcome: Progressing Goal: Individualized Educational Video(s) 05/04/2024 2302 by Teresa Jon HERO, RN Outcome: Progressing 05/04/2024 2301 by Teresa Jon HERO, RN Outcome: Progressing   Problem: Coping: Goal: Ability to adjust to condition or change in health will improve 05/04/2024 2302 by Teresa Jon HERO, RN Outcome: Progressing 05/04/2024 2301 by Teresa Jon HERO, RN Outcome: Progressing   Problem: Fluid Volume: Goal: Ability to maintain a balanced intake and output will improve 05/04/2024 2302 by Teresa Jon HERO, RN Outcome: Progressing 05/04/2024 2301 by  Teresa Jon HERO, RN Outcome: Progressing   Problem: Health Behavior/Discharge Planning: Goal: Ability to identify and utilize available resources and services will improve 05/04/2024 2302 by Teresa Jon HERO, RN Outcome: Progressing 05/04/2024 2301 by  Teresa Jon HERO, RN Outcome: Progressing Goal: Ability to manage health-related needs will improve 05/04/2024 2302 by Teresa Jon HERO, RN Outcome: Progressing 05/04/2024 2301 by Teresa Jon HERO, RN Outcome: Progressing   Problem: Metabolic: Goal: Ability to maintain appropriate glucose levels will improve 05/04/2024 2302 by Teresa Jon HERO, RN Outcome: Progressing 05/04/2024 2301 by Teresa Jon HERO, RN Outcome: Progressing   Problem: Nutritional: Goal: Maintenance of adequate nutrition will improve 05/04/2024 2302 by Teresa Jon HERO, RN Outcome: Progressing 05/04/2024 2301 by Teresa Jon HERO, RN Outcome: Progressing Goal: Progress toward achieving an optimal weight will improve 05/04/2024 2302 by Teresa Jon HERO, RN Outcome: Progressing 05/04/2024 2301 by Teresa Jon HERO, RN Outcome: Progressing   Problem: Skin Integrity: Goal: Risk for impaired skin integrity will decrease 05/04/2024 2302 by Teresa Jon HERO, RN Outcome: Progressing 05/04/2024 2301 by Teresa Jon HERO, RN Outcome: Progressing   Problem: Tissue Perfusion: Goal: Adequacy of tissue perfusion will improve 05/04/2024 2302 by Teresa Jon HERO, RN Outcome: Progressing 05/04/2024 2301 by Teresa Jon HERO, RN Outcome: Progressing

## 2024-05-04 NOTE — Progress Notes (Signed)
 [facility]  ROUNDING NOTE   Subjective:  Tracy Jennings is a 68 year old female with past medical conditions including dementia, hypertension, diabetes, hyperlipidemia, cervical cancer, recurrent pancreatitis, PVD, and end-stage renal disease on hemodialysis. Patient presents to the emergency department complaining of abd pain, weakness, nausea and diarrhea. She is admitted under observation for Generalized abdominal pain [R10.84] C. difficile diarrhea [A04.72] C. difficile colitis [A04.72]   Patient is known to our practice and receives outpatient dialysis treatments at [facility] on a MWF schedule, supervised by Dr. Dennise.   Update: No acute events overnight. Seen resting comfortably in bed. No distress noted.    Objective:  Vital signs in last 24 hours:  Temp:  [97.9 F (36.6 C)-98.5 F (36.9 C)] 98.2 F (36.8 C) (08/23 1226) Pulse Rate:  [70-73] 72 (08/23 1226) Resp:  [15-18] 18 (08/23 1226) BP: (124-187)/(39-68) 151/59 (08/23 1226) SpO2:  [90 %-99 %] 98 % (08/23 1226)  Weight change:  Filed Weights   05/01/24 1300 05/03/24 0735 05/03/24 1130  Weight: 52.6 kg 51.6 kg 54.1 kg    Intake/Output: I/O last 3 completed shifts: In: 3 [I.V.:3] Out: 800 [Other:800]   Intake/Output this shift:  No intake/output data recorded.  Physical Exam: General: NAD  Head: Normocephalic  Eyes: Anicteric, PERRL  Neck: Supple  Lungs:  Room air  Heart: Regular rate   Abdomen:  Soft  Extremities:  no peripheral edema.  Neurologic: alert  Skin: No lesions  Access: Left Avf    Basic Metabolic Panel: Recent Labs  Lab 04/28/24 1505 04/28/24 2302 04/29/24 0511 04/30/24 0429 05/03/24 0801  NA 138 139 139 136 135  K 3.4* 3.3* 3.9 3.8 3.7  CL 98 98 100 99 95*  CO2 24 25 23 29 29   GLUCOSE 59* 63* 91 79 124*  BUN 26* 29* 29* 17 27*  CREATININE 5.08* 5.05* 5.39* 3.73* 5.00*  CALCIUM  8.5* 8.4* 8.2* 8.2* 8.3*  MG  --   --  2.0  --   --   PHOS  --  4.3   --   --  2.6    Liver Function Tests: Recent Labs  Lab 04/28/24 1505 04/28/24 2302 04/29/24 0511 05/03/24 0801  AST 13*  --  10*  --   ALT 9  --  6  --   ALKPHOS 71  --  65  --   BILITOT 1.4*  --  0.9  --   PROT 6.5  --  5.7*  --   ALBUMIN 2.3* 2.3* 2.1* 1.8*   Recent Labs  Lab 04/28/24 1505  LIPASE 21   No results for input(s): AMMONIA in the last 168 hours.  CBC: Recent Labs  Lab 04/29/24 0511 04/30/24 0429 05/02/24 0922 05/03/24 0801 05/04/24 0338  WBC 15.0* 10.8* 11.7* 12.2* 11.5*  NEUTROABS  --   --   --   --  9.5*  HGB 7.6* 7.5* 10.1* 9.2* 9.9*  HCT 24.7* 24.7* 33.1* 29.5* 32.4*  MCV 97.2 99.2 100.0 98.3 99.4  PLT 281 237 208 224 220    Cardiac Enzymes: No results for input(s): CKTOTAL, CKMB, CKMBINDEX, TROPONINI in the last 168 hours.  BNP: Invalid input(s): POCBNP  CBG: Recent Labs  Lab 05/02/24 1633 05/02/24 2046 05/03/24 1203 05/03/24 2113 05/04/24 1132  GLUCAP 98 86 109* 179* 130*    Microbiology: Results for orders placed or performed during the hospital encounter of 04/28/24  Gastrointestinal Panel by PCR , Stool     Status: None  Collection Time: 04/28/24  5:52 PM   Specimen: Stool  Result Value Ref Range Status   Campylobacter species NOT DETECTED NOT DETECTED Final   Plesimonas shigelloides NOT DETECTED NOT DETECTED Final   Salmonella species NOT DETECTED NOT DETECTED Final   Yersinia enterocolitica NOT DETECTED NOT DETECTED Final   Vibrio species NOT DETECTED NOT DETECTED Final   Vibrio cholerae NOT DETECTED NOT DETECTED Final   Enteroaggregative E coli (EAEC) NOT DETECTED NOT DETECTED Final   Enteropathogenic E coli (EPEC) NOT DETECTED NOT DETECTED Final   Enterotoxigenic E coli (ETEC) NOT DETECTED NOT DETECTED Final   Shiga like toxin producing E coli (STEC) NOT DETECTED NOT DETECTED Final   Shigella/Enteroinvasive E coli (EIEC) NOT DETECTED NOT DETECTED Final   Cryptosporidium NOT DETECTED NOT DETECTED Final    Cyclospora cayetanensis NOT DETECTED NOT DETECTED Final   Entamoeba histolytica NOT DETECTED NOT DETECTED Final   Giardia lamblia NOT DETECTED NOT DETECTED Final   Adenovirus F40/41 NOT DETECTED NOT DETECTED Final   Astrovirus NOT DETECTED NOT DETECTED Final   Norovirus GI/GII NOT DETECTED NOT DETECTED Final   Rotavirus A NOT DETECTED NOT DETECTED Final   Sapovirus (I, II, IV, and V) NOT DETECTED NOT DETECTED Final    Comment: Performed at [facility], [address] [place], [place] [address]  C Difficile Quick Screen w PCR reflex     Status: Abnormal   Collection Time: 04/28/24  5:52 PM   Specimen: Stool  Result Value Ref Range Status   C Diff antigen POSITIVE (A) NEGATIVE Final   C Diff toxin NEGATIVE NEGATIVE Final   C Diff interpretation Results are indeterminate. See PCR results.  Final    Comment: Performed at [facility], [address] [place], [place] [address]  C. Diff by PCR, Reflexed     Status: Abnormal   Collection Time: 04/28/24  5:52 PM  Result Value Ref Range Status   Toxigenic C. Difficile by PCR POSITIVE (A) NEGATIVE Final    Comment: Positive for toxigenic C. difficile with little to no toxin production. Only treat if clinical presentation suggests symptomatic illness.   Hypervirulent Strain PRESUMPTIVE NEGATIVE PRESUMPTIVE NEGATIVE Final    Comment: Performed at [facility], [address] [place], [place] [address]    Coagulation Studies: No results for input(s): LABPROT, INR in the last 72 hours.  Urinalysis: No results for input(s): COLORURINE, LABSPEC, PHURINE, GLUCOSEU, HGBUR, BILIRUBINUR, KETONESUR, PROTEINUR, UROBILINOGEN, NITRITE, LEUKOCYTESUR in the last 72 hours.  Invalid input(s): APPERANCEUR    Imaging: No results found.   Medications:     aspirin  EC  81 mg Oral Daily   atorvastatin   40 mg Oral QHS   carvedilol   25 mg Oral BID WC   Chlorhexidine  Gluconate Cloth  6 each  Topical Q0600   cloNIDine   0.3 mg Transdermal Weekly   diltiazem   180 mg Oral Daily   [START ON 05/06/2024] epoetin  alfa-epbx (RETACRIT ) injection  4,000 Units Intravenous Q M,W,F-1800   folic acid   1 mg Oral Daily   insulin  aspart  0-5 Units Subcutaneous QHS   insulin  aspart  0-6 Units Subcutaneous TID WC   lactose free nutrition  237 mL Oral TID WC   losartan   100 mg Oral Daily   pantoprazole   40 mg Oral Daily   QUEtiapine   100 mg Oral QHS   QUEtiapine   25 mg Oral TID   sodium chloride  flush  3 mL Intravenous Q12H   spironolactone   25 mg  Oral Daily   traZODone   50 mg Oral QHS   vancomycin   125 mg Oral QID   acetaminophen  **OR** acetaminophen , hydrALAZINE , HYDROcodone -acetaminophen , morphine  injection, ondansetron  **OR** ondansetron  (ZOFRAN ) IV, QUEtiapine   Assessment/ Plan:  Ms. TRIANA COOVER is a 68 y.o.  female with past medical conditions including dementia, hypertension, diabetes, hyperlipidemia, cervical cancer, recurrent pancreatitis, PVD, and end-stage renal disease on hemodialysis. Patient presents to the emergency department with weakness, diarrhea and abd pain. She has been admitted for Generalized abdominal pain [R10.84] C. difficile diarrhea [A04.72] C. difficile colitis [A04.72]     [facility]/MWF/Lt AVG    End stage renal disease on hemodialysis. Receiving dialysis yesterday, UF 1.5L removed. Next treatment scheduled for Monday.    C difficile colitits with positive antigen. Will continue oral vancomycin  for 10 day course.    3. Anemia of chronic kidney disease Recent Labs       Lab Results  Component Value Date    HGB 9.9 (L) 05/04/2024      Patient received Mircera at outpatient clinic. Will decrease to Retacrit  4000 units with next treatment.    4. Secondary Hyperparathyroidism: with outpatient labs: PTH 548, phosphorus 4.3, calcium  8.0 on 04/22/24.    Recent Labs       Lab Results  Component Value Date    CALCIUM  8.3 (L) 05/03/2024     PHOS 2.6 05/03/2024      Will continue to monitor bone minerals during this admission.      LOS: 1 Alphonsa Brickle SHAUNNA Dines 8/23/202512:38 PM

## 2024-05-04 NOTE — Plan of Care (Signed)
  Problem: Clinical Measurements: Goal: Will remain free from infection Outcome: Progressing Goal: Diagnostic test results will improve Outcome: Progressing Goal: Respiratory complications will improve Outcome: Progressing Goal: Cardiovascular complication will be avoided Outcome: Progressing   Problem: Safety: Goal: Ability to remain free from injury will improve Outcome: Progressing   Problem: Education: Goal: Knowledge of General Education information will improve Description: Including pain rating scale, medication(s)/side effects and non-pharmacologic comfort measures Outcome: Not Progressing   Problem: Health Behavior/Discharge Planning: Goal: Ability to manage health-related needs will improve Outcome: Not Progressing   Problem: Clinical Measurements: Goal: Ability to maintain clinical measurements within normal limits will improve Outcome: Not Progressing   Problem: Activity: Goal: Risk for activity intolerance will decrease Outcome: Not Progressing   Problem: Nutrition: Goal: Adequate nutrition will be maintained Outcome: Not Progressing   Problem: Coping: Goal: Level of anxiety will decrease Outcome: Not Progressing   Problem: Elimination: Goal: Will not experience complications related to bowel motility Outcome: Not Progressing Goal: Will not experience complications related to urinary retention Outcome: Not Progressing   Problem: Pain Managment: Goal: General experience of comfort will improve and/or be controlled Outcome: Not Progressing   Problem: Skin Integrity: Goal: Risk for impaired skin integrity will decrease Outcome: Not Progressing   Problem: Education: Goal: Ability to describe self-care measures that may prevent or decrease complications (Diabetes Survival Skills Education) will improve Outcome: Not Progressing Goal: Individualized Educational Video(s) Outcome: Not Progressing   Problem: Coping: Goal: Ability to adjust to condition  or change in health will improve Outcome: Not Progressing   Problem: Fluid Volume: Goal: Ability to maintain a balanced intake and output will improve Outcome: Not Progressing   Problem: Health Behavior/Discharge Planning: Goal: Ability to identify and utilize available resources and services will improve Outcome: Not Progressing Goal: Ability to manage health-related needs will improve Outcome: Not Progressing   Problem: Metabolic: Goal: Ability to maintain appropriate glucose levels will improve Outcome: Not Progressing   Problem: Nutritional: Goal: Maintenance of adequate nutrition will improve Outcome: Not Progressing Goal: Progress toward achieving an optimal weight will improve Outcome: Not Progressing   Problem: Skin Integrity: Goal: Risk for impaired skin integrity will decrease Outcome: Not Progressing   Problem: Tissue Perfusion: Goal: Adequacy of tissue perfusion will improve Outcome: Not Progressing

## 2024-05-04 NOTE — TOC Progression Note (Addendum)
 Transition of Care [facility]) - Progression Note    Patient Details  Name: Tracy Jennings MRN: [id number] Date of Birth: 29-Jun-1956  Transition of Care [facility]) CM/SW Contact  [place] Ahmet Schank, [place] Phone Number: 05/04/2024, 10:21 AM  Clinical Narrative:     CSW met with patient. Sister was at bedside. CSW spoke with patient regarding SNF recommendations. Patient and sister declined. She stated that she was home for only a week after being discharged from [facility]. She stated that she did not like the facility. The sister advised that patient can discharge home with home health and the family will work together to help patient.   Sister advised of the following DME at home, RW, shower seat, BSC and hospital bed. Sister inquired about a fall mat. CSW advised that [employer] does not have fall mats but recommending [employer], [employer] or [employer].   Attending was notified that SNF placement was declined.      [facility] was the patients choice.                Expected Discharge Plan and Services                                               Social Drivers of Health (SDOH) Interventions SDOH Screenings   Food Insecurity: No Food Insecurity (04/30/2024)  Recent Concern: Food Insecurity - Food Insecurity Present (04/24/2024)   Received from [facility]  Housing: Low Risk  (04/30/2024)  Transportation Needs: No Transportation Needs (04/30/2024)  Recent Concern: Transportation Needs - Unmet Transportation Needs (04/24/2024)   Received from [facility]  Utilities: Not At Risk (04/30/2024)  Financial Resource Strain: High Risk (04/24/2024)   Received from [facility]  Social Connections: Unknown (04/30/2024)  Tobacco Use: Low Risk  (04/28/2024)    Readmission Risk Interventions     No data to display

## 2024-05-04 NOTE — Progress Notes (Signed)
 Progress Note   Patient: Tracy Jennings FMW:[id number] DOB: 03-30-1956 DOA: 04/28/2024     1 DOS: the patient was seen and examined on 05/04/2024   Brief hospital course: 68 y.o. female with a known history of diabetes, end-stage renal disease on dialysis Monday Wednesday Friday last session was 2 days ago, hypertension, iron deficiency anemia, hyperlipidemia, peripheral vascular disease, recurrent pancreatitis, dementia, TIA, cervical cancer previously on chemotherapy presents to the emergency department for evaluation of abdominal pain.  Patient was in a usual state of health until 5 days ago she reports the onset of abdominal pain described as generalized, associated with nausea and diarrhea..  She reports that her abdominal pain is entirely resolved.   Of note patient was taking an unknown antibiotics for a possible pneumonia.  She was admitted in July for hypertensive emergency and altered mental status.   Patient denies fevers/chills, weakness, dizziness, chest pain, shortness of breath, dysuria/frequency, changes in mental status.    Otherwise there has been no change in status. Patient has been taking medication as prescribed and there has been no recent change in medication or diet.  No recent antibiotics.  There has been no recent illness, hospitalizations, travel or sick contacts.     EMS/ED Course: Patient received fidaxomicin , lactated Ringer 's. Medical admission has been requested for further management of C. difficile colitis.  8/18.  Dificid  will be too expensive for patient upon getting out of the hospital.  Switch over to p.o. vancomycin .  Patient with 3 episodes of diarrhea.  Patient will have dialysis today. 8/19.  Hemoglobin down to 7.5.  Patient agreeable to blood transfusion.  Physical therapy recommending rehab.  Assessment and Plan: * C. difficile colitis Continue p.o. vancomycin .  Day 6 of 10 of antibiotics.  1 Bm yesterday per patient.   Anemia of chronic  disease    Latest Ref Rng & Units 05/04/2024    3:38 AM 05/03/2024    8:01 AM 05/02/2024    9:22 AM  CBC  WBC 4.0 - 10.5 K/uL 11.5  12.2  11.7   Hemoglobin 12.0 - 15.0 g/dL 9.9  9.2  89.8   Hematocrit 36.0 - 46.0 % 32.4  29.5  33.1   Platelets 150 - 400 K/uL 220  224  208   Hgb stable. Possibly erroneously elevated. ESA per nephrology.  Iron/TIBC/Ferritin/ %Sat    Component Value Date/Time   IRON 20 (L) 04/29/2024 0511   TIBC NOT CALCULATED 04/29/2024 0511   FERRITIN 1,494 (H) 04/29/2024 0511   IRONPCTSAT NOT CALCULATED 04/29/2024 0511   Folate decreased. B12 WNL. Supplement folate  Dementia with behavioral disturbance (HCC) On Seroquel  and trazodone   Essential hypertension Improved control On clonidine , Cardizem , hydralazine , losartan , spironolactone  and Coreg  at home.      05/04/2024    3:37 PM 05/04/2024    3:34 PM 05/04/2024   12:26 PM  Vitals with BMI  Systolic 122 148 848  Diastolic 56 68 59  Pulse 79 71 72   Continue to monitor  ESRD (end stage renal disease) (HCC) HD 8/21  Hyperlipidemia On atorvastatin     PVD (peripheral vascular disease) (HCC) On aspirin   GERD (gastroesophageal reflux disease) On PPI      Subjective: Had one bowel movement. Did have a fall reaching for something on the floor. Fell on soft mat states she did hit head. No evidence of skin break down. She will go to have CT of her head.   Physical Exam: Vitals:   05/04/24 0420 05/04/24  1226 05/04/24 1534 05/04/24 1537  BP: (!) 146/68 (!) 151/59 (!) 148/68 (!) 122/56  Pulse: 72 72 71 79  Resp: 15 18 16 17   Temp: 98.2 F (36.8 C) 98.2 F (36.8 C) 97.9 F (36.6 C) 98.3 F (36.8 C)  TempSrc: Oral Oral Oral Oral  SpO2: 93% 98% 97% 96%  Weight:        Physical Exam  Constitutional: In no distress.  Cardiovascular: Normal rate, regular rhythm. No lower extremity edema  Pulmonary: Non labored breathing on room air, no wheezing or rales.   Abdominal: Soft. Normal bowel sounds.  Non distended and non tender Musculoskeletal: Normal range of motion.     Neurological: Alert and oriented to person, place, and time. Speech is slowed.  Skin: Skin is warm and dry.     Data Reviewed:    Latest Ref Rng & Units 05/03/2024    8:01 AM 04/30/2024    4:29 AM 04/29/2024    5:11 AM  BMP  Glucose 70 - 99 mg/dL 875  79  91   BUN 8 - 23 mg/dL 27  17  29    Creatinine 0.44 - 1.00 mg/dL 4.99  6.26  4.60   Sodium 135 - 145 mmol/L 135  136  139   Potassium 3.5 - 5.1 mmol/L 3.7  3.8  3.9   Chloride 98 - 111 mmol/L 95  99  100   CO2 22 - 32 mmol/L 29  29  23    Calcium  8.9 - 10.3 mg/dL 8.3  8.2  8.2       Latest Ref Rng & Units 05/04/2024    3:38 AM 05/03/2024    8:01 AM 05/02/2024    9:22 AM  CBC  WBC 4.0 - 10.5 K/uL 11.5  12.2  11.7   Hemoglobin 12.0 - 15.0 g/dL 9.9  9.2  89.8   Hematocrit 36.0 - 46.0 % 32.4  29.5  33.1   Platelets 150 - 400 K/uL 220  224  208    Iron/TIBC/Ferritin/ %Sat    Component Value Date/Time   IRON 20 (L) 04/29/2024 0511   TIBC NOT CALCULATED 04/29/2024 0511   FERRITIN 1,494 (H) 04/29/2024 0511   IRONPCTSAT NOT CALCULATED 04/29/2024 0511     Family Communication: None at bedside. Updated 8/21.   Disposition: Status is: Observation Physical therapy recommending rehab.  Planned Discharge Destination: Rehab Patient now declining rehab. Will go home with her family in the AM. She remained hospitalized to ensure she had no brain bleed given her fall from her bed.     Time spent: 35 minutes  Author: Alban Pepper, MD 05/04/2024 6:17 PM  For on call review [email or web address].

## 2024-05-05 DIAGNOSIS — A0472 Enterocolitis due to Clostridium difficile, not specified as recurrent: Secondary | ICD-10-CM | POA: Diagnosis not present

## 2024-05-05 LAB — GLUCOSE, CAPILLARY
Glucose-Capillary: 83 mg/dL (ref 70–99)
Glucose-Capillary: 74 mg/dL (ref 70–99)
Glucose-Capillary: 88 mg/dL (ref 70–99)

## 2024-05-05 MED ORDER — QUETIAPINE FUMARATE 25 MG PO TABS
12.5000 mg | ORAL_TABLET | Freq: Three times a day (TID) | ORAL | Status: DC
  Administered 2024-05-05: 12.5 mg via ORAL
  Filled 2024-05-05: qty 1

## 2024-05-05 MED ORDER — VANCOMYCIN HCL 125 MG PO CAPS: 125.0000 mg | ORAL_CAPSULE | Freq: Four times a day (QID) | ORAL | 0 refills | Status: DC

## 2024-05-05 MED ORDER — NEPHRO-VITE 0.8 MG PO TABS: 1.0000 | ORAL_TABLET | Freq: Every day | ORAL | 2 refills | Status: DC

## 2024-05-05 MED ORDER — QUETIAPINE FUMARATE 25 MG PO TABS: 12.5000 mg | ORAL_TABLET | Freq: Three times a day (TID) | ORAL | Status: DC

## 2024-05-05 NOTE — Discharge Summary (Signed)
 Physician Discharge Summary   Patient: Tracy Jennings MRN: [id number] DOB: 05-01-1956  Admit date:     04/28/2024  Discharge date: 05/05/24  Discharge Physician: Alban Pepper   PCP: Zachary Idelia LABOR, MD   Recommendations at discharge:    F/u dosing of seroquel  and trazodone . Patient drowsy during the day and day time seroquel  dose decreased to 12.5mg    Discharge Diagnoses: Principal Problem:   C. difficile colitis Active Problems:   Anemia of chronic disease   Dementia with behavioral disturbance (HCC)   Essential hypertension   ESRD (end stage renal disease) (HCC)   Hyperlipidemia   Hypokalemia   PVD (peripheral vascular disease) (HCC)   GERD (gastroesophageal reflux disease)  Resolved Problems:   * No resolved hospital problems. [facility] Course: 68 y.o. female with a known history of diabetes, end-stage renal disease on dialysis Monday Wednesday Friday last session was 2 days ago, hypertension, iron deficiency anemia, hyperlipidemia, peripheral vascular disease, recurrent pancreatitis, dementia, TIA, cervical cancer previously on chemotherapy presents to the emergency department for evaluation of abdominal pain.  Patient was in a usual state of health until 5 days ago she reports the onset of abdominal pain described as generalized, associated with nausea and diarrhea..  She reports that her abdominal pain is entirely resolved.   Of note patient was taking an unknown antibiotics for a possible pneumonia.  She was admitted in July for hypertensive emergency and altered mental status.   Patient denies fevers/chills, weakness, dizziness, chest pain, shortness of breath, dysuria/frequency, changes in mental status.    Otherwise there has been no change in status. Patient has been taking medication as prescribed and there has been no recent change in medication or diet.  No recent antibiotics.  There has been no recent illness, hospitalizations, travel or sick contacts.      EMS/ED Course: Patient received fidaxomicin , lactated Ringer 's. Medical admission has been requested for further management of C. difficile colitis.  8/18.  Dificid  will be too expensive for patient upon getting out of the hospital.  Switch over to p.o. vancomycin .  Patient with 3 episodes of diarrhea.  Patient will have dialysis today. 8/19.  Hemoglobin down to 7.5.  Patient agreeable to blood transfusion.  Physical therapy recommending rehab.  Assessment and Plan: * C. difficile colitis Patient started on oral vancomycin  given Dificid , cost prohibitive.  Day 7 of 10 of antibiotics.  She will complete her course of antibiotics at home  Anemia of chronic disease Folate deficiency     Latest Ref Rng & Units 05/04/2024    3:38 AM 05/03/2024    8:01 AM 05/02/2024    9:22 AM  CBC  WBC 4.0 - 10.5 K/uL 11.5  12.2  11.7   Hemoglobin 12.0 - 15.0 g/dL 9.9  9.2  89.8   Hematocrit 36.0 - 46.0 % 32.4  29.5  33.1   Platelets 150 - 400 K/uL 220  224  208    Hemoglobin stable status post transfusion with 1 unit. Patient will continue folate supplementation at home  Dementia with behavioral disturbance (HCC) On Seroquel  and trazodone .  Drowsy during the day.  Seroquel  dose decreased to 12 and half milligrams 3 times daily.  Essential hypertension Patient continued on home clonidine , Cardizem , hydralazine , losartan , spironolactone  and Coreg     05/05/2024    8:27 AM 05/05/2024    3:58 AM 05/04/2024    8:42 PM  Vitals with BMI  Systolic 145 151 850  Diastolic 43 49 37  Pulse 62 62 70     ESRD (end stage renal disease) (HCC) Patient received hemodialysis while inpatient.  She will resume her outpatient schedule on discharge.  Hyperlipidemia On atorvastatin   Hypokalemia Resolved  PVD (peripheral vascular disease) (HCC) On aspirin   GERD (gastroesophageal reflux disease) On PPI  Fall ON the day prior to discharge patient had a fall when attempting to get out of bed. She sated that she  hit her head. There were no visible lacerations or other injuries noted on her head or body.  She underwent a CT scan of the head which showed no evidence of bleeding or other acute intracranial abnormality. On exam she also had no new neurologic deficits.  Will discharge home with home health PT as she and her family are declining SNF.       Consultants: Nephrology Procedures performed: None Disposition: Physical therapy and Occupational Therapy recommending SNF however patient and her siblings would like her to go home.  She will discharge home with the assistance of her siblings and home health PT Diet recommendation:  Regular diet DISCHARGE MEDICATION: Allergies as of 05/05/2024       Reactions   Amlodipine  Swelling   Knees down to ankles Knees down to ankles   Hctz [hydrochlorothiazide]    pancreatitis   Heparin  Other (See Comments)   Hx  HIT,   Pt reports cardiac arrest when given heparin    Lmw Heparin     Reports cardiac arrest when given heparin    Other Other (See Comments)   Reports cardiac arrest when given during surgery Reports cardiac arrest when given during surgery Reports cardiac arrest when given during surgery   Sulfa Antibiotics Rash   Lactose    Dairy Aid [tilactase]    Runny nose   Hydralazine     Nausea and rash        Medication List     TAKE these medications    acetaminophen  325 MG tablet Commonly known as: TYLENOL  Take 2 tablets (650 mg total) by mouth every 4 (four) hours as needed for mild pain (pain score 1-3) or fever (or temp > 37.5 C (99.5 F)).   aspirin  EC 81 MG tablet Take 1 tablet (81 mg total) by mouth daily. Swallow whole.   atorvastatin  40 MG tablet Commonly known as: LIPITOR Take 1 tablet (40 mg total) by mouth daily.   b complex-vitamin c-folic acid  0.8 MG Tabs tablet Take 1 tablet by mouth at bedtime.   bisacodyl  5 MG EC tablet Commonly known as: DULCOLAX Take 1 tablet (5 mg total) by mouth daily as needed for  moderate constipation.   carvedilol  25 MG tablet Commonly known as: COREG  Take 1 tablet (25 mg total) by mouth 2 (two) times daily with a meal.   cloNIDine  0.3 mg/24hr patch Commonly known as: CATAPRES  - Dosed in mg/24 hr Place 1 patch (0.3 mg total) onto the skin once a week.   diltiazem  180 MG 24 hr capsule Commonly known as: CARDIZEM  CD Take 1 capsule (180 mg total) by mouth daily.   hydrALAZINE  25 MG tablet Commonly known as: APRESOLINE  Take 1 tablet (25 mg total) by mouth every 6 (six) hours as needed (SBP >160).   losartan  100 MG tablet Commonly known as: COZAAR  SMARTSIG:1 Tablet(s) By Mouth Every Evening   pantoprazole  40 MG tablet Commonly known as: PROTONIX  Take 1 tablet (40 mg total) by mouth daily.   QUEtiapine  25 MG tablet Commonly known as: SEROQUEL  Take 1 tablet (25 mg total) by mouth  every 8 (eight) hours as needed (psychosis/anxiety). What changed: Another medication with the same name was changed. Make sure you understand how and when to take each.   QUEtiapine  100 MG tablet Commonly known as: SEROQUEL  Take 1 tablet (100 mg total) by mouth at bedtime. What changed: Another medication with the same name was changed. Make sure you understand how and when to take each.   QUEtiapine  25 MG tablet Commonly known as: SEROQUEL  Take 0.5 tablets (12.5 mg total) by mouth 3 (three) times daily. What changed: how much to take   spironolactone  25 MG tablet Commonly known as: ALDACTONE  Take 1 tablet (25 mg total) by mouth daily.   traZODone  50 MG tablet Commonly known as: DESYREL  Take 1 tablet (50 mg total) by mouth at bedtime.   vancomycin  125 MG capsule Commonly known as: VANCOCIN  Take 1 capsule (125 mg total) by mouth 4 (four) times daily.        Follow-up Information     George, Sionne A, MD. Schedule an appointment as soon as possible for a visit in 1 week(s).   Specialty: Family Medicine Contact information: [address] [place]  [address] [phone]                Discharge Exam: Filed Weights   05/01/24 1300 05/03/24 0735 05/03/24 1130  Weight: 52.6 kg 51.6 kg 54.1 kg    Constitutional: In no distress.  Drowsy Cardiovascular: Normal rate, regular rhythm. No lower extremity edema  Pulmonary: Non labored breathing on room air, no wheezing or rales.   Abdominal: Soft. Non distended and non tender Musculoskeletal: Normal range of motion.     Neurological: Alert and oriented to person, place, and time. Non focal  Skin: Skin is warm and dry.    Condition at discharge: stable  The results of significant diagnostics from this hospitalization (including imaging, microbiology, ancillary and laboratory) are listed below for reference.   Imaging Studies: CT HEAD WO CONTRAST ( ) Result Date: 05/04/2024 CLINICAL DATA:  fall, hit head EXAM: CT HEAD WITHOUT CONTRAST TECHNIQUE: Contiguous axial images were obtained from the base of the skull through the vertex without intravenous contrast. RADIATION DOSE REDUCTION: This exam was performed according to the departmental dose-optimization program which includes automated exposure control, adjustment of the mA and/or kV according to patient size and/or use of iterative reconstruction technique. COMPARISON:  CT head March 05, 2024. FINDINGS: Brain: Stable encephalomalacia in the right parietal lobe with areas of calcification. Similar patchy white matter hypodensities, compatible with chronic microvascular ischemic disease. No evidence of acute large vascular territory infarct, acute hemorrhage, mass lesion or midline shift. No hydrocephalus. Vascular: No hyperdense vessel. Skull: No acute fracture. Sinuses/Orbits: Mostly clear sinuses.  No acute orbital findings. Other: No mastoid effusions. IMPRESSION: No evidence of acute intracranial abnormality. Electronically Signed   By: Gilmore GORMAN Molt M.D.   On: 05/04/2024 21:41   CT ABDOMEN PELVIS WO CONTRAST Result Date:  04/28/2024 CLINICAL DATA:  Acute abdominal pain EXAM: CT ABDOMEN AND PELVIS WITHOUT CONTRAST TECHNIQUE: Multidetector CT imaging of the abdomen and pelvis was performed following the standard protocol without IV contrast. Unenhanced CT was performed per clinician order. Lack of IV contrast limits sensitivity and specificity, especially for evaluation of abdominal/pelvic solid viscera. RADIATION DOSE REDUCTION: This exam was performed according to the departmental dose-optimization program which includes automated exposure control, adjustment of the mA and/or kV according to patient size and/or use of iterative reconstruction technique. COMPARISON:  03/21/2024, 07/15/2022 FINDINGS: Lower chest:  There are patchy areas of consolidation at the lung bases, left greater than right, favor atelectasis over airspace disease. Trace bilateral pleural effusions. Hepatobiliary: Cholecystectomy. Unremarkable unenhanced appearance of the liver. Pancreas: Unremarkable unenhanced appearance. Spleen: Unremarkable unenhanced appearance. Adrenals/Urinary Tract: Bilateral renal cortical atrophy. No urinary tract calculi or obstructive uropathy. The bladder is decompressed. The adrenals appear stable. Stomach/Bowel: No bowel obstruction or ileus. There is a large amount of retained stool throughout the colon compatible with constipation. Normal retrocecal appendix. No bowel wall thickening or inflammatory change. Vascular/Lymphatic: Diffuse atherosclerosis throughout the aorta and its branches. No pathologic adenopathy. Reproductive: Uterus is atrophic.  No adnexal masses. Other: No free fluid or free intraperitoneal gas. Musculoskeletal: Diffuse body wall edema consistent with anasarca. Since the prior exam, there has been progressive invagination of the inferior endplate of the L4 vertebral body with impaction of the L5 vertebral body into the inferior endplate at L4. Moderate retrolisthesis of L5 relative to L4 as a result.  Progressive facet hypertrophic changes at the L4-5 level. Stable spondylosis and facet hypertrophy at L5-S1. No acute fractures. Reconstructed images demonstrate no additional findings. IMPRESSION: 1. Chronic compression deformity of the inferior endplate of the L4 vertebral body, with impaction of the L5 vertebral body as above. This results in progressive retrolisthesis of L5 relative to L4 and worsening facet hypertrophic changes at that level. 2. Diffuse body wall edema consistent with anasarca. 3. Patchy bibasilar consolidation, left greater than right, favor atelectasis over airspace disease. 4. Trace bilateral pleural effusions. 5. Significant fecal retention throughout the colon compatible with constipation. No bowel obstruction or ileus. 6.  Aortic Atherosclerosis (ICD10-I70.0). Electronically Signed   By: Ozell Daring M.D.   On: 04/28/2024 16:05    Microbiology: Results for orders placed or performed during the hospital encounter of 04/28/24  Gastrointestinal Panel by PCR , Stool     Status: None   Collection Time: 04/28/24  5:52 PM   Specimen: Stool  Result Value Ref Range Status   Campylobacter species NOT DETECTED NOT DETECTED Final   Plesimonas shigelloides NOT DETECTED NOT DETECTED Final   Salmonella species NOT DETECTED NOT DETECTED Final   Yersinia enterocolitica NOT DETECTED NOT DETECTED Final   Vibrio species NOT DETECTED NOT DETECTED Final   Vibrio cholerae NOT DETECTED NOT DETECTED Final   Enteroaggregative E coli (EAEC) NOT DETECTED NOT DETECTED Final   Enteropathogenic E coli (EPEC) NOT DETECTED NOT DETECTED Final   Enterotoxigenic E coli (ETEC) NOT DETECTED NOT DETECTED Final   Shiga like toxin producing E coli (STEC) NOT DETECTED NOT DETECTED Final   Shigella/Enteroinvasive E coli (EIEC) NOT DETECTED NOT DETECTED Final   Cryptosporidium NOT DETECTED NOT DETECTED Final   Cyclospora cayetanensis NOT DETECTED NOT DETECTED Final   Entamoeba histolytica NOT DETECTED NOT  DETECTED Final   Giardia lamblia NOT DETECTED NOT DETECTED Final   Adenovirus F40/41 NOT DETECTED NOT DETECTED Final   Astrovirus NOT DETECTED NOT DETECTED Final   Norovirus GI/GII NOT DETECTED NOT DETECTED Final   Rotavirus A NOT DETECTED NOT DETECTED Final   Sapovirus (I, II, IV, and V) NOT DETECTED NOT DETECTED Final    Comment: Performed at [facility], [address] [place], [place] [address]  C Difficile Quick Screen w PCR reflex     Status: Abnormal   Collection Time: 04/28/24  5:52 PM   Specimen: Stool  Result Value Ref Range Status   C Diff antigen POSITIVE (A) NEGATIVE Final   C Diff toxin NEGATIVE NEGATIVE  Final   C Diff interpretation Results are indeterminate. See PCR results.  Final    Comment: Performed at [facility], [address] [place], [place] [address]  C. Diff by PCR, Reflexed     Status: Abnormal   Collection Time: 04/28/24  5:52 PM  Result Value Ref Range Status   Toxigenic C. Difficile by PCR POSITIVE (A) NEGATIVE Final    Comment: Positive for toxigenic C. difficile with little to no toxin production. Only treat if clinical presentation suggests symptomatic illness.   Hypervirulent Strain PRESUMPTIVE NEGATIVE PRESUMPTIVE NEGATIVE Final    Comment: Performed at [facility], [address] [place], [place] [address]    Labs: CBC: Recent Labs  Lab 04/29/24 [phone] 04/30/24 0429 05/02/24 0922 05/03/24 0801 05/04/24 0338  WBC 15.0* 10.8* 11.7* 12.2* 11.5*  NEUTROABS  --   --   --   --  9.5*  HGB 7.6* 7.5* 10.1* 9.2* 9.9*  HCT 24.7* 24.7* 33.1* 29.5* 32.4*  MCV 97.2 99.2 100.0 98.3 99.4  PLT 281 237 208 224 220   Basic Metabolic Panel: Recent Labs  Lab 04/28/24 1505 04/28/24 2302 04/29/24 0511 04/30/24 0429 05/03/24 0801  NA 138 139 139 136 135  K 3.4* 3.3* 3.9 3.8 3.7  CL 98 98 100 99 95*  CO2 24 25 23 29 29   GLUCOSE 59* 63* 91 79 124*  BUN 26* 29* 29* 17 27*  CREATININE 5.08* 5.05* 5.39* 3.73* 5.00*   CALCIUM  8.5* 8.4* 8.2* 8.2* 8.3*  MG  --   --  2.0  --   --   PHOS  --  4.3  --   --  2.6   Liver Function Tests: Recent Labs  Lab 04/28/24 1505 04/28/24 2302 04/29/24 0511 05/03/24 0801  AST 13*  --  10*  --   ALT 9  --  6  --   ALKPHOS 71  --  65  --   BILITOT 1.4*  --  0.9  --   PROT 6.5  --  5.7*  --   ALBUMIN 2.3* 2.3* 2.1* 1.8*   CBG: Recent Labs  Lab 05/04/24 1132 05/04/24 1630 05/04/24 2040 05/05/24 0817 05/05/24 1123  GLUCAP 130* 83 93 83 74    Discharge time spent: greater than 30 minutes.  Signed: Alban Pepper, MD [facility] 05/05/2024

## 2024-05-05 NOTE — TOC Transition Note (Signed)
 Transition of Care [facility]) - Discharge Note   Patient Details  Name: Tracy Jennings MRN: [id number] Date of Birth: 03/02/1956  [facility] CM/SW Contact:  Racheal LITTIE Schimke, RN Phone Number: 05/05/2024, 3:59 PM    Final next level of care: Home w Home Health Services Barriers to Discharge: Barriers Resolved   Patient Goals and CMS Choice Patient states their goals for this hospitalization and ongoing recovery are:: Declines SNF, Prefer to go home with [facility] CMS Medicare.gov Compare Post Acute Care list provided to:: Patient Choice offered to / list presented to : Patient, Sibling      Discharge Placement                  Name of family member notified: [name] Patient and family notified of of transfer: 05/05/24  Discharge Plan and Services Additional resources added to the After Visit Summary for                  DME Arranged: N/A DME Agency: NA       HH Arranged: PT, OT, RN HH Agency: [facility] Date HH Agency Contacted: 05/03/24      Social Drivers of Health (SDOH) Interventions SDOH Screenings   Food Insecurity: No Food Insecurity (04/30/2024)  Recent Concern: Food Insecurity - Food Insecurity Present (04/24/2024)   Received [facility]  Housing: Low Risk  (04/30/2024)  Transportation Needs: No Transportation Needs (04/30/2024)  Recent Concern: Transportation Needs - Unmet Transportation Needs (04/24/2024)   Received from [facility]  Utilities: Not At Risk (04/30/2024)  Financial Resource Strain: High Risk (04/24/2024)   Received from [facility]  Social Connections: Unknown (04/30/2024)  Tobacco Use: Low Risk  (04/28/2024)     Readmission Risk Interventions     No data to display

## 2024-05-05 NOTE — Progress Notes (Signed)
 [facility]  ROUNDING NOTE   Subjective:  Tracy Jennings is a 68 year old female with past medical conditions including dementia, hypertension, diabetes, hyperlipidemia, cervical cancer, recurrent pancreatitis, PVD, and end-stage renal disease on hemodialysis. Patient presents to the emergency department complaining of abd pain, weakness, nausea and diarrhea. She is admitted under observation for Generalized abdominal pain [R10.84] C. difficile diarrhea [A04.72] C. difficile colitis [A04.72]   Patient is known to our practice and receives outpatient dialysis treatments at [facility] on a MWF schedule, supervised by Dr. Dennise.   Update: No acute events overnight. Husband at bedside. Patient sitting up trying to eat breakfast. Endorses poor appetite.Reports feeling tired. Per husband, plans to discharge today.  Objective:  Vital signs in last 24 hours:  Temp:  [97.9 F (36.6 C)-98.3 F (36.8 C)] 98 F (36.7 C) (08/24 0827) Pulse Rate:  [62-79] 62 (08/24 0827) Resp:  [15-18] 16 (08/24 0827) BP: (122-151)/(37-68) 145/43 (08/24 0827) SpO2:  [95 %-98 %] 95 % (08/24 0827)  Weight change:  Filed Weights   05/01/24 1300 05/03/24 0735 05/03/24 1130  Weight: 52.6 kg 51.6 kg 54.1 kg    Intake/Output: I/O last 3 completed shifts: In: 260 [P.O.:257; I.V.:3] Out: -    Intake/Output this shift:  No intake/output data recorded.  Physical Exam: General: Ill-appearing  Head: Normocephalic  Eyes: Anicteric  Neck: Supple  Lungs:  Room air  Heart: Regular rate and rhythm  Abdomen:  Soft  Extremities:  No peripheral edema.  Neurologic: alert  Skin: No lesions  Access: Left AVF    Basic Metabolic Panel: Recent Labs  Lab 04/28/24 1505 04/28/24 2302 04/29/24 0511 04/30/24 0429 05/03/24 0801  NA 138 139 139 136 135  K 3.4* 3.3* 3.9 3.8 3.7  CL 98 98 100 99 95*  CO2 24 25 23 29 29   GLUCOSE 59* 63* 91 79 124*  BUN 26* 29* 29* 17 27*  CREATININE 5.08*  5.05* 5.39* 3.73* 5.00*  CALCIUM  8.5* 8.4* 8.2* 8.2* 8.3*  MG  --   --  2.0  --   --   PHOS  --  4.3  --   --  2.6    Liver Function Tests: Recent Labs  Lab 04/28/24 1505 04/28/24 2302 04/29/24 0511 05/03/24 0801  AST 13*  --  10*  --   ALT 9  --  6  --   ALKPHOS 71  --  65  --   BILITOT 1.4*  --  0.9  --   PROT 6.5  --  5.7*  --   ALBUMIN 2.3* 2.3* 2.1* 1.8*   Recent Labs  Lab 04/28/24 1505  LIPASE 21   No results for input(s): AMMONIA in the last 168 hours.  CBC: Recent Labs  Lab 04/29/24 0511 04/30/24 0429 05/02/24 0922 05/03/24 0801 05/04/24 0338  WBC 15.0* 10.8* 11.7* 12.2* 11.5*  NEUTROABS  --   --   --   --  9.5*  HGB 7.6* 7.5* 10.1* 9.2* 9.9*  HCT 24.7* 24.7* 33.1* 29.5* 32.4*  MCV 97.2 99.2 100.0 98.3 99.4  PLT 281 237 208 224 220    Cardiac Enzymes: No results for input(s): CKTOTAL, CKMB, CKMBINDEX, TROPONINI in the last 168 hours.  BNP: Invalid input(s): POCBNP  CBG: Recent Labs  Lab 05/04/24 1132 05/04/24 1630 05/04/24 2040 05/05/24 0817 05/05/24 1123  GLUCAP 130* 83 93 83 74    Microbiology: Results for orders placed or performed during the hospital encounter of 04/28/24  Gastrointestinal  Panel by PCR , Stool     Status: None   Collection Time: 04/28/24  5:52 PM   Specimen: Stool  Result Value Ref Range Status   Campylobacter species NOT DETECTED NOT DETECTED Final   Plesimonas shigelloides NOT DETECTED NOT DETECTED Final   Salmonella species NOT DETECTED NOT DETECTED Final   Yersinia enterocolitica NOT DETECTED NOT DETECTED Final   Vibrio species NOT DETECTED NOT DETECTED Final   Vibrio cholerae NOT DETECTED NOT DETECTED Final   Enteroaggregative E coli (EAEC) NOT DETECTED NOT DETECTED Final   Enteropathogenic E coli (EPEC) NOT DETECTED NOT DETECTED Final   Enterotoxigenic E coli (ETEC) NOT DETECTED NOT DETECTED Final   Shiga like toxin producing E coli (STEC) NOT DETECTED NOT DETECTED Final    Shigella/Enteroinvasive E coli (EIEC) NOT DETECTED NOT DETECTED Final   Cryptosporidium NOT DETECTED NOT DETECTED Final   Cyclospora cayetanensis NOT DETECTED NOT DETECTED Final   Entamoeba histolytica NOT DETECTED NOT DETECTED Final   Giardia lamblia NOT DETECTED NOT DETECTED Final   Adenovirus F40/41 NOT DETECTED NOT DETECTED Final   Astrovirus NOT DETECTED NOT DETECTED Final   Norovirus GI/GII NOT DETECTED NOT DETECTED Final   Rotavirus A NOT DETECTED NOT DETECTED Final   Sapovirus (I, II, IV, and V) NOT DETECTED NOT DETECTED Final    Comment: Performed at [facility], [address] [place], [place] [address]  C Difficile Quick Screen w PCR reflex     Status: Abnormal   Collection Time: 04/28/24  5:52 PM   Specimen: Stool  Result Value Ref Range Status   C Diff antigen POSITIVE (A) NEGATIVE Final   C Diff toxin NEGATIVE NEGATIVE Final   C Diff interpretation Results are indeterminate. See PCR results.  Final    Comment: Performed at [facility], [address] [place], [place] [address]  C. Diff by PCR, Reflexed     Status: Abnormal   Collection Time: 04/28/24  5:52 PM  Result Value Ref Range Status   Toxigenic C. Difficile by PCR POSITIVE (A) NEGATIVE Final    Comment: Positive for toxigenic C. difficile with little to no toxin production. Only treat if clinical presentation suggests symptomatic illness.   Hypervirulent Strain PRESUMPTIVE NEGATIVE PRESUMPTIVE NEGATIVE Final    Comment: Performed at [facility], [address] [place], [place] [address]    Coagulation Studies: No results for input(s): LABPROT, INR in the last 72 hours.  Urinalysis: No results for input(s): COLORURINE, LABSPEC, PHURINE, GLUCOSEU, HGBUR, BILIRUBINUR, KETONESUR, PROTEINUR, UROBILINOGEN, NITRITE, LEUKOCYTESUR in the last 72 hours.  Invalid input(s): APPERANCEUR    Imaging: CT HEAD WO CONTRAST ( ) Result Date: 05/04/2024 CLINICAL  DATA:  fall, hit head EXAM: CT HEAD WITHOUT CONTRAST TECHNIQUE: Contiguous axial images were obtained from the base of the skull through the vertex without intravenous contrast. RADIATION DOSE REDUCTION: This exam was performed according to the departmental dose-optimization program which includes automated exposure control, adjustment of the mA and/or kV according to patient size and/or use of iterative reconstruction technique. COMPARISON:  CT head March 05, 2024. FINDINGS: Brain: Stable encephalomalacia in the right parietal lobe with areas of calcification. Similar patchy white matter hypodensities, compatible with chronic microvascular ischemic disease. No evidence of acute large vascular territory infarct, acute hemorrhage, mass lesion or midline shift. No hydrocephalus. Vascular: No hyperdense vessel. Skull: No acute fracture. Sinuses/Orbits: Mostly clear sinuses.  No acute orbital findings. Other: No mastoid effusions. IMPRESSION: No evidence of acute intracranial abnormality. Electronically Signed  By: Gilmore GORMAN Molt M.D.   On: 05/04/2024 21:41     Medications:     aspirin  EC  81 mg Oral Daily   atorvastatin   40 mg Oral QHS   carvedilol   25 mg Oral BID WC   Chlorhexidine  Gluconate Cloth  6 each Topical Q0600   cloNIDine   0.3 mg Transdermal Weekly   diltiazem   180 mg Oral Daily   [START ON 05/06/2024] epoetin  alfa-epbx (RETACRIT ) injection  4,000 Units Intravenous Q M,W,F-1800   folic acid   1 mg Oral Daily   insulin  aspart  0-5 Units Subcutaneous QHS   insulin  aspart  0-6 Units Subcutaneous TID WC   lactose free nutrition  237 mL Oral TID WC   losartan   100 mg Oral Daily   pantoprazole   40 mg Oral Daily   QUEtiapine   100 mg Oral QHS   QUEtiapine   25 mg Oral TID   sodium chloride  flush  3 mL Intravenous Q12H   spironolactone   25 mg Oral Daily   traZODone   50 mg Oral QHS   vancomycin   125 mg Oral QID   acetaminophen  **OR** acetaminophen , hydrALAZINE , HYDROcodone -acetaminophen ,  morphine  injection, ondansetron  **OR** ondansetron  (ZOFRAN ) IV, QUEtiapine   Assessment/ Plan:  [name] is a 68 y.o.  female with past medical conditions including dementia, hypertension, diabetes, hyperlipidemia, cervical cancer, recurrent pancreatitis, PVD, and end-stage renal disease on hemodialysis. Patient presents to the emergency department with weakness, diarrhea and abd pain. She has been admitted for Generalized abdominal pain [R10.84] C. difficile diarrhea [A04.72] C. difficile colitis [A04.72]   [facility]/MWF/Lt AVG    End stage renal disease on hemodialysis. Received dialysis Friday, UF 1.5L removed. Next treatment scheduled for tomorrow if patient is still here.    C difficile colitits with positive antigen. Continuing oral vancomycin  for 10 day course.    3. Anemia of chronic kidney disease Recent Labs           Lab Results  Component Value Date    HGB 9.9 (L) 05/04/2024      Patient received Mircera at outpatient clinic. Continue Retacrit  4000 units with next treatment.    4. Secondary Hyperparathyroidism: with outpatient labs: PTH 548, phosphorus 4.3, calcium  8.0 on 04/22/24.    Recent Labs           Lab Results  Component Value Date    CALCIUM  8.3 (L) 05/03/2024    PHOS 2.6 05/03/2024      Continuing to monitor bone minerals during this admission.    LOS: 1 Thelia Tanksley P Levorn 8/24/202511:39 AM

## 2024-05-05 NOTE — Discharge Instructions (Addendum)
 Please take your vancomycin  tablets at 6 pm and 10pm tonight. Then follow the instructions on the bottle for the last three days.   Please follow up with your primary care physician about the doses of your medications to help dementia. Your seroquel  dose was decreased to 12.5mg  or Half a tablet during the day because this was making you sleepy.

## 2024-05-05 NOTE — Progress Notes (Signed)
 PT Cancellation Note  Patient Details Name: Tracy Jennings MRN: [id number] DOB: Oct 30, 1955   Cancelled Treatment:    Reason Eval/Treat Not Completed: Patient declined, no reason specified  Pt in bed.  Breakfast tray in front of pt (10:47)  stated she is eating and cannot participate.  Stated her lunch will be here soon so she will again be unable to do therapy.  She then closes her eyes and does not answer any other questions and turns her head away to the left.     Lauraine Gills 05/05/2024, 1:50 PM

## 2024-05-06 NOTE — Progress Notes (Signed)
 D/C order noted.  Contacted  [facility] to be advised of pt and d/c today. The pat should resume care on  05/06/2024.  Requested documents faxed to clinic for continuation of care.  Suzen Satchel Dialysis Navigator [phone]

## 2024-05-16 ENCOUNTER — Emergency Department

## 2024-05-16 ENCOUNTER — Encounter (HOSPITAL_COMMUNITY): Payer: Self-pay | Admitting: Internal Medicine

## 2024-05-16 ENCOUNTER — Other Ambulatory Visit: Payer: Self-pay

## 2024-05-16 ENCOUNTER — Emergency Department
Admission: EM | Admit: 2024-05-16 | Discharge: 2024-05-16 | Disposition: A | Discharge status: Other Acute Inpatient Hospital | Attending: Emergency Medicine | Admitting: Emergency Medicine

## 2024-05-16 ENCOUNTER — Inpatient Hospital Stay (HOSPITAL_COMMUNITY)
Admission: EM | Admit: 2024-05-16 | Discharge: 2024-06-25 | DRG: 981 | Disposition: A | Discharge status: Hospice | Attending: Family Medicine | Admitting: Family Medicine

## 2024-05-16 ENCOUNTER — Encounter (HOSPITAL_COMMUNITY): Payer: Self-pay

## 2024-05-16 DIAGNOSIS — E875 Hyperkalemia: Secondary | ICD-10-CM | POA: Diagnosis present

## 2024-05-16 DIAGNOSIS — F0390 Unspecified dementia without behavioral disturbance: Secondary | ICD-10-CM | POA: Diagnosis not present

## 2024-05-16 DIAGNOSIS — D72829 Elevated white blood cell count, unspecified: Secondary | ICD-10-CM | POA: Diagnosis not present

## 2024-05-16 DIAGNOSIS — I739 Peripheral vascular disease, unspecified: Secondary | ICD-10-CM | POA: Diagnosis not present

## 2024-05-16 DIAGNOSIS — Z79899 Other long term (current) drug therapy: Secondary | ICD-10-CM

## 2024-05-16 DIAGNOSIS — R7989 Other specified abnormal findings of blood chemistry: Secondary | ICD-10-CM | POA: Insufficient documentation

## 2024-05-16 DIAGNOSIS — R188 Other ascites: Secondary | ICD-10-CM | POA: Diagnosis present

## 2024-05-16 DIAGNOSIS — N939 Abnormal uterine and vaginal bleeding, unspecified: Secondary | ICD-10-CM

## 2024-05-16 DIAGNOSIS — Y842 Radiological procedure and radiotherapy as the cause of abnormal reaction of the patient, or of later complication, without mention of misadventure at the time of the procedure: Secondary | ICD-10-CM | POA: Diagnosis present

## 2024-05-16 DIAGNOSIS — I82C29 Chronic embolism and thrombosis of unspecified internal jugular vein: Secondary | ICD-10-CM

## 2024-05-16 DIAGNOSIS — D72825 Bandemia: Secondary | ICD-10-CM | POA: Diagnosis present

## 2024-05-16 DIAGNOSIS — M7989 Other specified soft tissue disorders: Secondary | ICD-10-CM | POA: Diagnosis not present

## 2024-05-16 DIAGNOSIS — Z923 Personal history of irradiation: Secondary | ICD-10-CM

## 2024-05-16 DIAGNOSIS — Z5982 Transportation insecurity: Secondary | ICD-10-CM

## 2024-05-16 DIAGNOSIS — E1165 Type 2 diabetes mellitus with hyperglycemia: Secondary | ICD-10-CM | POA: Diagnosis present

## 2024-05-16 DIAGNOSIS — E1122 Type 2 diabetes mellitus with diabetic chronic kidney disease: Secondary | ICD-10-CM | POA: Diagnosis present

## 2024-05-16 DIAGNOSIS — A0471 Enterocolitis due to Clostridium difficile, recurrent: Secondary | ICD-10-CM | POA: Diagnosis present

## 2024-05-16 DIAGNOSIS — E1169 Type 2 diabetes mellitus with other specified complication: Secondary | ICD-10-CM | POA: Diagnosis not present

## 2024-05-16 DIAGNOSIS — Z9841 Cataract extraction status, right eye: Secondary | ICD-10-CM

## 2024-05-16 DIAGNOSIS — Z992 Dependence on renal dialysis: Secondary | ICD-10-CM

## 2024-05-16 DIAGNOSIS — Z5941 Food insecurity: Secondary | ICD-10-CM

## 2024-05-16 DIAGNOSIS — N823 Fistula of vagina to large intestine: Secondary | ICD-10-CM | POA: Diagnosis not present

## 2024-05-16 DIAGNOSIS — G9341 Metabolic encephalopathy: Secondary | ICD-10-CM | POA: Diagnosis present

## 2024-05-16 DIAGNOSIS — D490 Neoplasm of unspecified behavior of digestive system: Secondary | ICD-10-CM | POA: Diagnosis present

## 2024-05-16 DIAGNOSIS — K861 Other chronic pancreatitis: Secondary | ICD-10-CM | POA: Diagnosis present

## 2024-05-16 DIAGNOSIS — R197 Diarrhea, unspecified: Secondary | ICD-10-CM | POA: Diagnosis not present

## 2024-05-16 DIAGNOSIS — F03918 Unspecified dementia, unspecified severity, with other behavioral disturbance: Secondary | ICD-10-CM | POA: Diagnosis present

## 2024-05-16 DIAGNOSIS — A0472 Enterocolitis due to Clostridium difficile, not specified as recurrent: Secondary | ICD-10-CM | POA: Insufficient documentation

## 2024-05-16 DIAGNOSIS — E8809 Other disorders of plasma-protein metabolism, not elsewhere classified: Secondary | ICD-10-CM | POA: Diagnosis present

## 2024-05-16 DIAGNOSIS — F0394 Unspecified dementia, unspecified severity, with anxiety: Secondary | ICD-10-CM | POA: Diagnosis present

## 2024-05-16 DIAGNOSIS — Z882 Allergy status to sulfonamides status: Secondary | ICD-10-CM

## 2024-05-16 DIAGNOSIS — K6289 Other specified diseases of anus and rectum: Secondary | ICD-10-CM | POA: Diagnosis not present

## 2024-05-16 DIAGNOSIS — Z59868 Other specified financial insecurity: Secondary | ICD-10-CM

## 2024-05-16 DIAGNOSIS — Z9221 Personal history of antineoplastic chemotherapy: Secondary | ICD-10-CM

## 2024-05-16 DIAGNOSIS — E46 Unspecified protein-calorie malnutrition: Secondary | ICD-10-CM | POA: Diagnosis not present

## 2024-05-16 DIAGNOSIS — R918 Other nonspecific abnormal finding of lung field: Secondary | ICD-10-CM

## 2024-05-16 DIAGNOSIS — R000 Tachycardia, unspecified: Secondary | ICD-10-CM | POA: Diagnosis present

## 2024-05-16 DIAGNOSIS — L299 Pruritus, unspecified: Secondary | ICD-10-CM | POA: Diagnosis not present

## 2024-05-16 DIAGNOSIS — E11649 Type 2 diabetes mellitus with hypoglycemia without coma: Secondary | ICD-10-CM | POA: Diagnosis present

## 2024-05-16 DIAGNOSIS — N824 Other female intestinal-genital tract fistulae: Secondary | ICD-10-CM

## 2024-05-16 DIAGNOSIS — K921 Melena: Secondary | ICD-10-CM | POA: Diagnosis not present

## 2024-05-16 DIAGNOSIS — E1151 Type 2 diabetes mellitus with diabetic peripheral angiopathy without gangrene: Secondary | ICD-10-CM | POA: Diagnosis present

## 2024-05-16 DIAGNOSIS — R6889 Other general symptoms and signs: Secondary | ICD-10-CM | POA: Diagnosis not present

## 2024-05-16 DIAGNOSIS — K91840 Postprocedural hemorrhage and hematoma of a digestive system organ or structure following a digestive system procedure: Secondary | ICD-10-CM | POA: Diagnosis not present

## 2024-05-16 DIAGNOSIS — Y838 Other surgical procedures as the cause of abnormal reaction of the patient, or of later complication, without mention of misadventure at the time of the procedure: Secondary | ICD-10-CM | POA: Diagnosis not present

## 2024-05-16 DIAGNOSIS — D631 Anemia in chronic kidney disease: Secondary | ICD-10-CM | POA: Diagnosis present

## 2024-05-16 DIAGNOSIS — R531 Weakness: Secondary | ICD-10-CM | POA: Diagnosis not present

## 2024-05-16 DIAGNOSIS — Z7401 Bed confinement status: Secondary | ICD-10-CM | POA: Diagnosis not present

## 2024-05-16 DIAGNOSIS — I120 Hypertensive chronic kidney disease with stage 5 chronic kidney disease or end stage renal disease: Secondary | ICD-10-CM | POA: Diagnosis present

## 2024-05-16 DIAGNOSIS — I953 Hypotension of hemodialysis: Secondary | ICD-10-CM | POA: Diagnosis not present

## 2024-05-16 DIAGNOSIS — Z823 Family history of stroke: Secondary | ICD-10-CM

## 2024-05-16 DIAGNOSIS — M533 Sacrococcygeal disorders, not elsewhere classified: Secondary | ICD-10-CM | POA: Diagnosis present

## 2024-05-16 DIAGNOSIS — K56691 Other complete intestinal obstruction: Secondary | ICD-10-CM | POA: Diagnosis present

## 2024-05-16 DIAGNOSIS — E871 Hypo-osmolality and hyponatremia: Secondary | ICD-10-CM | POA: Diagnosis present

## 2024-05-16 DIAGNOSIS — E876 Hypokalemia: Secondary | ICD-10-CM | POA: Diagnosis present

## 2024-05-16 DIAGNOSIS — Z515 Encounter for palliative care: Secondary | ICD-10-CM

## 2024-05-16 DIAGNOSIS — L988 Other specified disorders of the skin and subcutaneous tissue: Secondary | ICD-10-CM | POA: Diagnosis not present

## 2024-05-16 DIAGNOSIS — Z8616 Personal history of COVID-19: Secondary | ICD-10-CM

## 2024-05-16 DIAGNOSIS — Z888 Allergy status to other drugs, medicaments and biological substances status: Secondary | ICD-10-CM

## 2024-05-16 DIAGNOSIS — R0902 Hypoxemia: Secondary | ICD-10-CM | POA: Diagnosis not present

## 2024-05-16 DIAGNOSIS — I2510 Atherosclerotic heart disease of native coronary artery without angina pectoris: Secondary | ICD-10-CM | POA: Diagnosis present

## 2024-05-16 DIAGNOSIS — D62 Acute posthemorrhagic anemia: Secondary | ICD-10-CM | POA: Diagnosis present

## 2024-05-16 DIAGNOSIS — Z6822 Body mass index (BMI) 22.0-22.9, adult: Secondary | ICD-10-CM

## 2024-05-16 DIAGNOSIS — Z8742 Personal history of other diseases of the female genital tract: Secondary | ICD-10-CM | POA: Diagnosis not present

## 2024-05-16 DIAGNOSIS — K6389 Other specified diseases of intestine: Secondary | ICD-10-CM | POA: Diagnosis not present

## 2024-05-16 DIAGNOSIS — E43 Unspecified severe protein-calorie malnutrition: Secondary | ICD-10-CM | POA: Diagnosis present

## 2024-05-16 DIAGNOSIS — D649 Anemia, unspecified: Secondary | ICD-10-CM | POA: Diagnosis not present

## 2024-05-16 DIAGNOSIS — J90 Pleural effusion, not elsewhere classified: Secondary | ICD-10-CM | POA: Diagnosis present

## 2024-05-16 DIAGNOSIS — Z8542 Personal history of malignant neoplasm of other parts of uterus: Secondary | ICD-10-CM

## 2024-05-16 DIAGNOSIS — Z789 Other specified health status: Secondary | ICD-10-CM | POA: Diagnosis not present

## 2024-05-16 DIAGNOSIS — Z66 Do not resuscitate: Secondary | ICD-10-CM | POA: Diagnosis present

## 2024-05-16 DIAGNOSIS — Z7189 Other specified counseling: Secondary | ICD-10-CM | POA: Diagnosis not present

## 2024-05-16 DIAGNOSIS — I10 Essential (primary) hypertension: Secondary | ICD-10-CM | POA: Diagnosis not present

## 2024-05-16 DIAGNOSIS — Z9049 Acquired absence of other specified parts of digestive tract: Secondary | ICD-10-CM

## 2024-05-16 DIAGNOSIS — R627 Adult failure to thrive: Secondary | ICD-10-CM | POA: Diagnosis present

## 2024-05-16 DIAGNOSIS — K529 Noninfective gastroenteritis and colitis, unspecified: Secondary | ICD-10-CM | POA: Diagnosis not present

## 2024-05-16 DIAGNOSIS — R4589 Other symptoms and signs involving emotional state: Secondary | ICD-10-CM | POA: Diagnosis not present

## 2024-05-16 DIAGNOSIS — Z8673 Personal history of transient ischemic attack (TIA), and cerebral infarction without residual deficits: Secondary | ICD-10-CM

## 2024-05-16 DIAGNOSIS — C539 Malignant neoplasm of cervix uteri, unspecified: Secondary | ICD-10-CM | POA: Diagnosis not present

## 2024-05-16 DIAGNOSIS — N186 End stage renal disease: Secondary | ICD-10-CM | POA: Diagnosis present

## 2024-05-16 DIAGNOSIS — G893 Neoplasm related pain (acute) (chronic): Secondary | ICD-10-CM | POA: Diagnosis not present

## 2024-05-16 DIAGNOSIS — Z7982 Long term (current) use of aspirin: Secondary | ICD-10-CM

## 2024-05-16 DIAGNOSIS — I82C11 Acute embolism and thrombosis of right internal jugular vein: Secondary | ICD-10-CM | POA: Diagnosis not present

## 2024-05-16 DIAGNOSIS — Z751 Person awaiting admission to adequate facility elsewhere: Secondary | ICD-10-CM

## 2024-05-16 DIAGNOSIS — Z8619 Personal history of other infectious and parasitic diseases: Secondary | ICD-10-CM

## 2024-05-16 DIAGNOSIS — A419 Sepsis, unspecified organism: Secondary | ICD-10-CM | POA: Diagnosis present

## 2024-05-16 DIAGNOSIS — R451 Restlessness and agitation: Secondary | ICD-10-CM | POA: Diagnosis not present

## 2024-05-16 DIAGNOSIS — Z7901 Long term (current) use of anticoagulants: Secondary | ICD-10-CM

## 2024-05-16 DIAGNOSIS — Z89422 Acquired absence of other left toe(s): Secondary | ICD-10-CM

## 2024-05-16 DIAGNOSIS — Z8249 Family history of ischemic heart disease and other diseases of the circulatory system: Secondary | ICD-10-CM

## 2024-05-16 DIAGNOSIS — Z8541 Personal history of malignant neoplasm of cervix uteri: Secondary | ICD-10-CM

## 2024-05-16 DIAGNOSIS — Z9842 Cataract extraction status, left eye: Secondary | ICD-10-CM

## 2024-05-16 DIAGNOSIS — Z833 Family history of diabetes mellitus: Secondary | ICD-10-CM

## 2024-05-16 DIAGNOSIS — C530 Malignant neoplasm of endocervix: Secondary | ICD-10-CM | POA: Diagnosis not present

## 2024-05-16 DIAGNOSIS — H5789 Other specified disorders of eye and adnexa: Secondary | ICD-10-CM | POA: Diagnosis present

## 2024-05-16 DIAGNOSIS — E8770 Fluid overload, unspecified: Secondary | ICD-10-CM | POA: Diagnosis present

## 2024-05-16 DIAGNOSIS — R590 Localized enlarged lymph nodes: Secondary | ICD-10-CM | POA: Diagnosis present

## 2024-05-16 DIAGNOSIS — R109 Unspecified abdominal pain: Secondary | ICD-10-CM | POA: Diagnosis present

## 2024-05-16 LAB — CBC WITH DIFFERENTIAL/PLATELET
Abs Immature Granulocytes: 0.14 K/uL — ABNORMAL HIGH (ref 0.00–0.07)
Basophils Absolute: 0.1 K/uL (ref 0.0–0.1)
Basophils Relative: 0 %
Eosinophils Absolute: 0 K/uL (ref 0.0–0.5)
Eosinophils Relative: 0 %
HCT: 26.6 % — ABNORMAL LOW (ref 36.0–46.0)
Hemoglobin: 8.3 g/dL — ABNORMAL LOW (ref 12.0–15.0)
Immature Granulocytes: 1 %
Lymphocytes Relative: 2 %
Lymphs Abs: 0.4 K/uL — ABNORMAL LOW (ref 0.7–4.0)
MCH: 30.4 pg (ref 26.0–34.0)
MCHC: 31.2 g/dL (ref 30.0–36.0)
MCV: 97.4 fL (ref 80.0–100.0)
Monocytes Absolute: 0.8 K/uL (ref 0.1–1.0)
Monocytes Relative: 4 %
Neutro Abs: 20.2 K/uL — ABNORMAL HIGH (ref 1.7–7.7)
Neutrophils Relative %: 93 %
Platelets: 252 K/uL (ref 150–400)
RBC: 2.73 MIL/uL — ABNORMAL LOW (ref 3.87–5.11)
RDW: 15.9 % — ABNORMAL HIGH (ref 11.5–15.5)
Smear Review: NORMAL
WBC: 21.7 K/uL — ABNORMAL HIGH (ref 4.0–10.5)
nRBC: 0 % (ref 0.0–0.2)

## 2024-05-16 LAB — COMPREHENSIVE METABOLIC PANEL WITH GFR
ALT: 7 U/L (ref 0–44)
AST: 12 U/L — ABNORMAL LOW (ref 15–41)
Albumin: 2 g/dL — ABNORMAL LOW (ref 3.5–5.0)
Alkaline Phosphatase: 55 U/L (ref 38–126)
Anion gap: 13 (ref 5–15)
BUN: 24 mg/dL — ABNORMAL HIGH (ref 8–23)
CO2: 24 mmol/L (ref 22–32)
Calcium: 8 mg/dL — ABNORMAL LOW (ref 8.9–10.3)
Chloride: 102 mmol/L (ref 98–111)
Creatinine, Ser: 4.34 mg/dL — ABNORMAL HIGH (ref 0.44–1.00)
GFR, Estimated: 11 mL/min — ABNORMAL LOW (ref >60)
Glucose, Bld: 86 mg/dL (ref 70–99)
Potassium: 3.4 mmol/L — ABNORMAL LOW (ref 3.5–5.1)
Sodium: 139 mmol/L (ref 135–145)
Total Bilirubin: 1.3 mg/dL — ABNORMAL HIGH (ref 0.0–1.2)
Total Protein: 5.8 g/dL — ABNORMAL LOW (ref 6.5–8.1)

## 2024-05-16 LAB — LACTIC ACID, PLASMA
Lactic Acid, Venous: 0.7 mmol/L (ref 0.5–1.9)
Lactic Acid, Venous: 1.2 mmol/L (ref 0.5–1.9)

## 2024-05-16 LAB — LIPASE, BLOOD: Lipase: 22 U/L (ref 11–51)

## 2024-05-16 LAB — TROPONIN I (HIGH SENSITIVITY)
Troponin I (High Sensitivity): 37 ng/L — ABNORMAL HIGH (ref <18)
Troponin I (High Sensitivity): 30 ng/L — ABNORMAL HIGH (ref <18)

## 2024-05-16 MED ORDER — VANCOMYCIN HCL 250 MG PO CAPS
500.0000 mg | ORAL_CAPSULE | Freq: Four times a day (QID) | ORAL | Status: DC
  Administered 2024-05-16: 500 mg via ORAL
  Filled 2024-05-16 (×2): qty 2

## 2024-05-16 MED ORDER — METRONIDAZOLE 500 MG/100ML IV SOLN
500.0000 mg | Freq: Three times a day (TID) | INTRAVENOUS | Status: DC
  Administered 2024-05-16: 500 mg via INTRAVENOUS
  Filled 2024-05-16: qty 100

## 2024-05-16 MED ORDER — CHLORHEXIDINE GLUCONATE CLOTH 2 % EX PADS
6.0000 | MEDICATED_PAD | Freq: Every day | CUTANEOUS | Status: DC
  Administered 2024-05-17 – 2024-05-19 (×3): 6 via TOPICAL

## 2024-05-16 MED ORDER — SODIUM CHLORIDE 0.9% FLUSH
10.0000 mL | Freq: Two times a day (BID) | INTRAVENOUS | Status: DC
  Administered 2024-05-17 – 2024-06-14 (×48): 10 mL
  Administered 2024-06-14: 20 mL
  Administered 2024-06-15 (×2): 10 mL
  Administered 2024-06-16: 20 mL
  Administered 2024-06-18 – 2024-06-21 (×5): 10 mL

## 2024-05-16 NOTE — Progress Notes (Signed)
 Plan of Care Note for accepted transfer   Patient: Tracy Jennings MRN: [id number]   DOA: 05/16/2024  Facility requesting transfer: [facility] Requesting Provider: Ernest Reason for transfer: Surgical management Facility course: 68 yo w ESRD on HD presented persistent abdominal pain and diarrhea despite oral vanc treatment for Cdiff.  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  The fistula cannot be managed at [facility] and the surgeons here at [facility] are willing to take care of it but first the patient will need her C. difficile treated effectively.  Infectious disease was consulted and they recommended adding Flagyl  and increasing the oral vancomycin  dosing. The patient is hemodynamically stable but did meet sepsis criteria.  Plan of care: The patient is accepted for admission to Telemetry unit, at [facility]..    Author: ARTHEA CHILD, MD 05/16/2024  Check [email or web address] for on-call coverage.  Nursing staff, Please call [facility] Admits & Consults System-Wide number on Amion as soon as patient's arrival, so appropriate admitting provider can evaluate the pt.

## 2024-05-16 NOTE — H&P (Incomplete)
 History and Physical    Tracy Jennings FMW:[id number] DOB: 06/23/1956 DOA: 05/16/2024  Patient coming from: Patient was transferred from [facility].  Chief Complaint: Abdominal pain.  HPI: Tracy Jennings is a 67 y.o. female with history of dementia, ESRD on hemodialysis, prior history of cervical cancer, hypertension, anemia, hyperlipidemia, TIA who was recently treated for C. difficile colitis and discharged from the hospital on May 05, 2024 presents back to the ER at [facility] with complaints of abdominal pain.  Patient is a poor historian and has dementia and the only complaint patient has is that her abdomen is hurting.  No nausea vomiting was noticed.  After admission patient did have watery diarrhea.  ED Course: In the ER patient had CT abdomen pelvis which shows features concerning for Colo uterine fistula.  General surgery at [facility] was consulted requested that patient may need to be transferred to higher level of care and the ER physician discussed with Dr. Dasie general surgeon at [facility] who agreed to see patient in consult.  At the time of my exam patient is not in distress.  Labs show WBC of 21.7 hemoglobin 8.3 troponins are flat at 37 and 30.  Potassium 3.1 creatinine 4.3.  Lactic acid was normal.  Review of Systems: As per HPI, rest all negative.   Past Medical History:  Diagnosis Date   Anemia 04/2018   low iron. to be started on supplements   Cervical cancer (HCC)    CKD (chronic kidney disease)    Stage IV   Complication of anesthesia    receceived too much anesthesia, that she was in coma for a couple days    COVID-19 virus detected 10/28/2019   Diabetes mellitus without complication (HCC)    type II   ESRD (end stage renal disease) (HCC)    Heart murmur    followed as a child only   HSIL (high grade squamous intraepithelial lesion) on Pap smear of cervix    Hyperlipidemia associated with type 2 diabetes mellitus (HCC)    Hypertension     Pancreatitis    Peripheral vascular disease (HCC)     Past Surgical History:  Procedure Laterality Date   AMPUTATION TOE Left 2013   2nd toe. tip of toe (toe nail was infected)   AV FISTULA PLACEMENT Left 05/11/2018   Procedure: ARTERIOVENOUS (AV) FISTULA CREATION;  Surgeon: Jama Cordella MATSU, MD;  Location: [facility] ORS;  Service: Vascular;  Laterality: Left;   CATARACT EXTRACTION     CERVICAL CONIZATION W/BX N/A 04/08/2020   Procedure: CONIZATION CERVIX WITH BIOPSY;  Surgeon: Mancil Barter, MD;  Location: [facility] ORS;  Service: Gynecology;  Laterality: N/A;   CHOLECYSTECTOMY  2014   COLONOSCOPY     COLONOSCOPY WITH PROPOFOL  N/A 01/10/2018   Procedure: COLONOSCOPY WITH PROPOFOL ;  Surgeon: Toledo, Ladell POUR, MD;  Location: [facility] ENDOSCOPY;  Service: Gastroenterology;  Laterality: N/A;   DIALYSIS/PERMA CATHETER INSERTION N/A 05/21/2018   Procedure: DIALYSIS/PERMA CATHETER INSERTION;  Surgeon: Marea Selinda RAMAN, MD;  Location: [facility] INVASIVE CV LAB;  Service: Cardiovascular;  Laterality: N/A;   DIALYSIS/PERMA CATHETER INSERTION N/A 07/14/2020   Procedure: DIALYSIS/PERMA CATHETER INSERTION;  Surgeon: Marea Selinda RAMAN, MD;  Location: [facility] INVASIVE CV LAB;  Service: Cardiovascular;  Laterality: N/A;   DIALYSIS/PERMA CATHETER REMOVAL N/A 04/11/2019   Procedure: DIALYSIS/PERMA CATHETER REMOVAL;  Surgeon: Marea Selinda RAMAN, MD;  Location: [facility] INVASIVE CV LAB;  Service: Cardiovascular;  Laterality: N/A;   EYE SURGERY Bilateral 2018   cataract  extractions   IR IMAGING GUIDED PORT INSERTION  06/05/2020   THROMBECTOMY W/ EMBOLECTOMY  05/11/2018   Procedure: THROMBECTOMY ARTERIOVENOUS FISTULA;  Surgeon: Jama Cordella MATSU, MD;  Location: [facility] ORS;  Service: Vascular;;   TUBAL LIGATION  1984     reports that she has never smoked. She has never used smokeless tobacco. She reports that she does not drink alcohol and does not use drugs.  Allergies  Allergen Reactions   Amlodipine  Swelling    Knees down to ankles Knees down  to ankles   Hctz [Hydrochlorothiazide]     pancreatitis   Heparin  Other (See Comments)    Hx  HIT,   Pt reports cardiac arrest when given heparin    Lmw Heparin      Reports cardiac arrest when given heparin    Other Other (See Comments)    Reports cardiac arrest when given during surgery Reports cardiac arrest when given during surgery Reports cardiac arrest when given during surgery    Sulfa Antibiotics Rash   Lactose    Dairy Aid [Tilactase]     Runny nose   Hydralazine      Nausea and rash    Family History  Problem Relation Age of Onset   Stroke Mother    Hypertension Mother    Gout Mother    Cancer Father    Diabetes Sister    Diabetes Maternal Grandmother    Diabetes Maternal Grandfather    Diabetes Paternal Grandmother    Diabetes Paternal Grandfather     Prior to Admission medications   Medication Sig Start Date End Date Taking? Authorizing Provider  acetaminophen  (TYLENOL ) 325 MG tablet Take 2 tablets (650 mg total) by mouth every 4 (four) hours as needed for mild pain (pain score 1-3) or fever (or temp > 37.5 C (99.5 F)). 04/02/24   Dorinda Drue DASEN, MD  aspirin  EC 81 MG tablet Take 1 tablet (81 mg total) by mouth daily. Swallow whole. 04/02/24   Dorinda Drue DASEN, MD  atorvastatin  (LIPITOR) 40 MG tablet Take 1 tablet (40 mg total) by mouth daily. 04/03/24   Dorinda Drue DASEN, MD  b complex-vitamin c-folic acid  (NEPHRO-VITE) 0.8 MG TABS tablet Take 1 tablet by mouth at bedtime. 05/05/24   Franchot Novel, MD  bisacodyl  (DULCOLAX) 5 MG EC tablet Take 1 tablet (5 mg total) by mouth daily as needed for moderate constipation. 04/02/24   Dorinda Drue DASEN, MD  carvedilol  (COREG ) 25 MG tablet Take 1 tablet (25 mg total) by mouth 2 (two) times daily with a meal. 04/01/22   Krishnan, Sendil K, MD  cloNIDine  (CATAPRES  - DOSED IN MG/24 HR) 0.3 mg/24hr patch Place 1 patch (0.3 mg total) onto the skin once a week. 04/07/24   Dorinda Drue DASEN, MD  diltiazem  (CARDIZEM  CD) 180 MG 24 hr capsule Take 1  capsule (180 mg total) by mouth daily. 04/03/24   Dorinda Drue DASEN, MD  doxycycline (VIBRAMYCIN) 100 MG capsule Take 100 mg by mouth 2 (two) times daily. 04/23/24   [provider]  ferrous sulfate  325 (65 FE) MG tablet Take 325 mg by mouth daily. 04/23/24   [provider]  hydrALAZINE  (APRESOLINE ) 25 MG tablet Take 1 tablet (25 mg total) by mouth every 6 (six) hours as needed (SBP >160). 04/02/24   Dorinda Drue DASEN, MD  isosorbide  mononitrate (IMDUR ) 120 MG 24 hr tablet Take 120 mg by mouth daily. Patient not taking: Reported on 05/16/2024 04/28/24   [provider]  losartan  (COZAAR ) 100 MG  tablet SMARTSIG:1 Tablet(s) By Mouth Every Evening 02/03/22   [provider]  pantoprazole  (PROTONIX ) 40 MG tablet Take 1 tablet (40 mg total) by mouth daily. 04/02/24   Dorinda Drue DASEN, MD  QUEtiapine  (SEROQUEL ) 100 MG tablet Take 1 tablet (100 mg total) by mouth at bedtime. Patient not taking: Reported on 05/16/2024 04/02/24   Dorinda Drue DASEN, MD  QUEtiapine  (SEROQUEL ) 25 MG tablet Take 1 tablet (25 mg total) by mouth every 8 (eight) hours as needed (psychosis/anxiety). Patient not taking: Reported on 05/16/2024 04/02/24   Dorinda Drue DASEN, MD  QUEtiapine  (SEROQUEL ) 25 MG tablet Take 0.5 tablets (12.5 mg total) by mouth 3 (three) times daily. Patient not taking: Reported on 05/16/2024 05/05/24   Franchot Novel, MD  SENEXON-S 8.6-50 MG tablet Take 1 tablet by mouth daily. 04/23/24   [provider]  spironolactone  (ALDACTONE ) 25 MG tablet Take 1 tablet (25 mg total) by mouth daily. 04/03/24   Dorinda Drue DASEN, MD  traZODone  (DESYREL ) 50 MG tablet Take 1 tablet (50 mg total) by mouth at bedtime. 04/02/24   Dorinda Drue DASEN, MD  vancomycin  (VANCOCIN ) 125 MG capsule Take 1 capsule (125 mg total) by mouth 4 (four) times daily. 05/05/24   Franchot Novel, MD    Physical Exam: Constitutional: Moderately built and nourished. Vitals:   05/16/24 2200  BP: (!) 152/58  Pulse: 70   Eyes:  Anicteric no pallor. ENMT: No discharge from the ears eyes nose or mouth. Neck: No mass felt.  No neck rigidity. Respiratory: No rhonchi or crepitations. Cardiovascular: S1-S2 heard. Abdomen: Soft nontender bowel sound present. Musculoskeletal: No edema. Skin: No rash. Neurologic: Alert awake oriented to her name.  Moving all extremities. Psychiatric: Oriented to her name.   Labs on Admission: I have personally reviewed following labs and imaging studies  CBC: Recent Labs  Lab 05/16/24 1308  WBC 21.7*  NEUTROABS 20.2*  HGB 8.3*  HCT 26.6*  MCV 97.4  PLT 252   Basic Metabolic Panel: Recent Labs  Lab 05/16/24 1308  NA 139  K 3.4*  CL 102  CO2 24  GLUCOSE 86  BUN 24*  CREATININE 4.34*  CALCIUM  8.0*   GFR: Estimated Creatinine Clearance: 10.5 mL/min (A) (by C-G formula based on SCr of 4.34 mg/dL (H)). Liver Function Tests: Recent Labs  Lab 05/16/24 1308  AST 12*  ALT 7  ALKPHOS 55  BILITOT 1.3*  PROT 5.8*  ALBUMIN 2.0*   Recent Labs  Lab 05/16/24 1308  LIPASE 22   No results for input(s): AMMONIA in the last 168 hours. Coagulation Profile: No results for input(s): INR, PROTIME in the last 168 hours. Cardiac Enzymes: No results for input(s): CKTOTAL, CKMB, CKMBINDEX, TROPONINI in the last 168 hours. BNP (last 3 results) No results for input(s): PROBNP in the last 8760 hours. HbA1C: No results for input(s): HGBA1C in the last 72 hours. CBG: No results for input(s): GLUCAP in the last 168 hours. Lipid Profile: No results for input(s): CHOL, HDL, LDLCALC, TRIG, CHOLHDL, LDLDIRECT in the last 72 hours. Thyroid Function Tests: No results for input(s): TSH, T4TOTAL, FREET4, T3FREE, THYROIDAB in the last 72 hours. Anemia Panel: No results for input(s): VITAMINB12, FOLATE, FERRITIN, TIBC, IRON, RETICCTPCT in the last 72 hours. Urine analysis:    Component Value Date/Time   COLORURINE YELLOW (A)  10/31/2017 1959   APPEARANCEUR HAZY (A) 10/31/2017 1959   APPEARANCEUR Hazy 12/09/2013 1046   LABSPEC 1.017 10/31/2017 1959   LABSPEC 1.031 12/09/2013 1046   PHURINE  6.0 10/31/2017 1959   GLUCOSEU >=500 (A) 10/31/2017 1959   GLUCOSEU >=500 12/09/2013 1046   HGBUR NEGATIVE 10/31/2017 1959   BILIRUBINUR NEGATIVE 10/31/2017 1959   BILIRUBINUR Negative 12/09/2013 1046   KETONESUR NEGATIVE 10/31/2017 1959   PROTEINUR >=300 (A) 10/31/2017 1959   NITRITE NEGATIVE 10/31/2017 1959   LEUKOCYTESUR NEGATIVE 10/31/2017 1959   LEUKOCYTESUR Negative 12/09/2013 1046   Sepsis Labs: @LABRCNTIP (procalcitonin:4,lacticidven:4) )No results found for this or any previous visit (from the past 240 hours).   Radiological Exams on Admission: CT ABDOMEN PELVIS WO CONTRAST Result Date: 05/16/2024 CLINICAL DATA:  Weakness.  C diff. EXAM: CT ABDOMEN AND PELVIS WITHOUT CONTRAST TECHNIQUE: Multidetector CT imaging of the abdomen and pelvis was performed following the standard protocol without IV contrast. RADIATION DOSE REDUCTION: This exam was performed according to the departmental dose-optimization program which includes automated exposure control, adjustment of the mA and/or kV according to patient size and/or use of iterative reconstruction technique. COMPARISON:  Radiograph earlier today.  CT 04/28/2024 FINDINGS: Lower chest: Increased bilateral pleural effusions, left greater than right. An area of rounded density within the anterior left lower lobe measuring 3.1 cm, series 4, image 8, is more rounded mid left amorphous than on prior exam. Hepatobiliary: Unremarkable unenhanced appearance of the liver. Post cholecystectomy with stable biliary tree. Pancreas: No ductal dilatation or inflammation. Spleen: Normal in size without focal abnormality. Adrenals/Urinary Tract: Stable adrenal thickening. Chronic bilateral renal parenchymal atrophy. No hydronephrosis or renal stone. Minimally distended urinary bladder, diffusely  thick walled. Stomach/Bowel: Persistent circumferential wall thickening of the rectosigmoid which is decompressed. Immediately adjacent is an area of colonic distention with stool, however also demonstrates wall thickening. There is a moderate to large volume of formed stool throughout the remainder of the colon, without additional sites of colonic inflammation. No small bowel obstruction or small bowel inflammation. Normal appendix tentatively visualized. Stomach is nondistended Vascular/Lymphatic: Advanced aortic and branch atherosclerosis. No abdominopelvic adenopathy. Reproductive: There is a large amount of air within the endometrial canal, series 2, image 69 and series 6, image 58. 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 Large uterine soft tissue defect with stool in the region of suspected fistula, series 2 image 69 and series 6, image 59. Marked uterine calcifications. Other: Generalized body wall edema, progressive from prior exam. Small volume abdominopelvic ascites. No free intra-abdominal air. Musculoskeletal: The bones are subjectively under mineralized. Stable appearance of L4-L5 with invagination of inferior L4 vertebral body impacted on L5. Stable anterolisthesis at the L4-L5 level with spinal canal narrowing. No new osseous abnormalities. IMPRESSION: 1. Circumferential wall thickening of the rectosigmoid colon suspicious for colitis. 2. Findings consistent with colo-uterine fistula, large defect in the posterior uterus which contains stool, and air in the endometrial canal. Recommend surgical consultation. 3. Increased bilateral pleural effusions, left greater than right. An area of rounded density within the anterior left lower lobe measuring 3.1 cm is more rounded than on prior exam. This may represent rounded atelectasis, however underlying mass is not excluded. Recommend  follow-up chest CT after course of treatment. 4. Generalized body wall edema, progressive from prior exam. Small volume abdominopelvic ascites. Aortic Atherosclerosis (ICD10-I70.0). Electronically Signed   By: Andrea Gasman M.D.   On: 05/16/2024 16:49   DG Abdomen 1 View Result Date: 05/16/2024 CLINICAL DATA:  Toxic megacolon. EXAM: ABDOMEN - 1 VIEW COMPARISON:  March 21, 2024.  April 28, 2024. FINDINGS: Status  post cholecystectomy. No abnormal bowel dilatation is noted. Large amount of stool seen throughout the colon and rectum. IMPRESSION: Large stool burden. No abnormal bowel dilatation. Electronically Signed   By: Lynwood Landy Raddle M.D.   On: 05/16/2024 16:23   DG Chest Portable 1 View Result Date: 05/16/2024 CLINICAL DATA:  Weakness. EXAM: PORTABLE CHEST 1 VIEW COMPARISON:  04/01/2024. FINDINGS: Stable cardiomediastinal contours. Stable right chest Port-A-Cath. Moderate left pleural effusion with left basilar opacities favored to reflect atelectasis, increased since the prior exam. Low lung volumes with bronchovascular crowding. No pneumothorax. No acute osseous abnormality. IMPRESSION: Moderate left pleural effusion with left basilar opacities favored to reflect atelectasis, increased since the prior exam. Electronically Signed   By: Harrietta Sherry M.D.   On: 05/16/2024 13:44    EKG: Independently reviewed.  Normal sinus rhythm.  Assessment/Plan Principal Problem:   Colouterine fistula Active Problems:   C. difficile colitis   Dementia with behavioral disturbance (HCC)   Essential hypertension   ESRD (end stage renal disease) (HCC)   Chronic recurrent pancreatitis (HCC)   PVD (peripheral vascular disease) (HCC)   Type 2 diabetes mellitus with other specified complication (HCC)   Cervical cancer (HCC)    Colo uterine fistula -    discussed with Dr. Dasie on-call general surgeon.  Okay for clear liquid diet and also okay with oral medication at this time.  General surgery will be seeing  patient in consult for further recommendations. Recent C. difficile colitis with CT scan still showing features concerning for colitis and patient still having diarrhea.  Accepting physician had discussed with infectious disease who had recommended continuing with oral medications for C. difficile along with IV Flagyl  given the fistula. Leukocytosis could be secondary to C. difficile.  Cultures were obtained. Hypertension on clonidine  Cardizem  hydralazine  losartan  spironolactone  and Coreg .  Follow blood pressure trends. ESRD on hemodialysis CT scan shows fluid overload status.  Presently not in distress.  Consult nephrology for dialysis. Anemia likely from renal disease follow CBC. Diabetes mellitus type II last hemoglobin A1c in June 2025 was 5.1.  Presently not on any antidiabetic medications.  Will check CBGs closely. History of TIA takes statins and aspirin . History of dementia.  Takes trazodone  used to be on Seroquel .  Need to confirm medications. History of cervical cancer status postchemotherapy per records. Prior history of recurrent pancreatitis.  Since patient has a Colo uterine fistula with possible recurrent C. difficile will need close monitoring further workup and more than 2 midnight stay.   DVT prophylaxis: SCDs. Code Status: Full code. Family Communication: Will need to discuss with family. Disposition Plan: Medical floor. Consults called: General Surgery. Admission status: Inpatient

## 2024-05-16 NOTE — ED Notes (Signed)
 Called lab to obtain cultures and lactic

## 2024-05-16 NOTE — ED Triage Notes (Signed)
 Pt arrives via EMS from [facility] for weakness for the last several days. Pt sts that she was dx with Cdiff and has been taking PO abx and not getting any better.

## 2024-05-16 NOTE — ED Provider Notes (Addendum)
 [facility] Provider Note    Event Date/Time   First MD Initiated Contact with Patient 05/16/24 1253     (approximate)   History   No chief complaint on file.   HPI  JERIANN SAYRES is a 68 y.o. female with ESRD on dialysis Monday Wednesday Friday, dementia who comes in for abdominal pain.  Patient is currently being treated for C. difficile.  She reports having increasing nausea, diarrhea and weakness.  Patient's discharge summary was not reviewed from 05/05/2024.  She was discharged on oral vancomycin .  However since then she is continue to have worsening diarrhea and continued abdominal discomfort.  She reports just feeling very weak and they had difficulties even getting patient up.  She was seen by the primary care doctor who had patient sent over here for evaluation due to her weakness.  Patient denies any falls or hitting her head she only reports worsening abdominal pain.  She reports just feeling really weak.  She reports having dialysis yesterday.  Physical Exam   Triage Vital Signs: ED Triage Vitals  Encounter Vitals Group     BP      Girls Systolic BP Percentile      Girls Diastolic BP Percentile      Boys Systolic BP Percentile      Boys Diastolic BP Percentile      Pulse      Resp      Temp      Temp src      SpO2      Weight      Height      Head Circumference      Peak Flow      Pain Score      Pain Loc      Pain Education      Exclude from Growth Chart     Most recent vital signs: Vitals:   05/16/24 1302  BP: (!) 137/43  Pulse: 71  Resp: 17  Temp: 98 F (36.7 C)  SpO2: 99%     General: Awake, no distress.  CV:  Good peripheral perfusion.  Resp:  Normal effort.  Abd:  No distention.  Tenderness throughout her abdomen Other:     ED Results / Procedures / Treatments   Labs (all labs ordered are listed, but only abnormal results are displayed) Labs Reviewed  CBC WITH DIFFERENTIAL/PLATELET - Abnormal; Notable  for the following components:      Result Value   WBC 21.7 (*)    RBC 2.73 (*)    Hemoglobin 8.3 (*)    HCT 26.6 (*)    RDW 15.9 (*)    All other components within normal limits  COMPREHENSIVE METABOLIC PANEL WITH GFR - Abnormal; Notable for the following components:   Potassium 3.4 (*)    BUN 24 (*)    Creatinine, Ser 4.34 (*)    Calcium  8.0 (*)    Total Protein 5.8 (*)    Albumin 2.0 (*)    AST 12 (*)    Total Bilirubin 1.3 (*)    GFR, Estimated 11 (*)    All other components within normal limits  TROPONIN I (HIGH SENSITIVITY) - Abnormal; Notable for the following components:   Troponin I (High Sensitivity) 37 (*)    All other components within normal limits  CULTURE, BLOOD (ROUTINE X 2)  CULTURE, BLOOD (ROUTINE X 2)  LIPASE, BLOOD  URINALYSIS, ROUTINE W REFLEX MICROSCOPIC  LACTIC ACID, PLASMA  LACTIC ACID,  PLASMA     EKG  My interpretation of EKG:  Sinus rate of 71 without any ST elevation or T wave inversions, normal intervals  RADIOLOGY I have reviewed the xray personally and interpreted patient is in moderate left pleural effusion   PROCEDURES:  Critical Care performed: No  Procedures   MEDICATIONS ORDERED IN ED: Medications - No data to display   IMPRESSION / MDM / ASSESSMENT AND PLAN / ED COURSE  I reviewed the triage vital signs and the nursing notes.   Patient's presentation is most consistent with acute presentation with potential threat to life or bodily function.   Patient comes in with concern for continued diarrhea, abdominal pain in the setting of known C. difficile.  Currently on oral vancomycin .  I am hesitant to give her fluids due to her being a dialysis patient her x-ray does show some pleural effusion.  I will check labs evaluate for Electra abnormalities, AKI.  Troponin is slightly elevated but similar to prior.  CBC does show elevated white count up to 21,000.  Hemoglobin slightly lower.  CMP shows creatinine that is elevated but  patient has known ESRD.  Blood pressures are elevated.  Given the pleural effusion at this time going to hold off on any fluids but I am concerned about her rising white count.  I will get CT imaging to ensure no toxic megacolon.  Patient be handed off to oncoming team pending CT imaging and I suspect admission due to worsening symptoms in the setting of C. difficile with new leukocytosis.   Family now at bedside who does report that patient just finished the antibiotics a few days ago but they feel that she is getting worse since antibiotics have stopped.  This was the oral vancomycin .  She reports little spurts of diarrhea.  The patient is on the cardiac monitor to evaluate for evidence of arrhythmia and/or significant heart rate changes.     FINAL CLINICAL IMPRESSION(S) / ED DIAGNOSES   Final diagnoses:  Weakness  C. difficile colitis  Leukocytosis, unspecified type     Rx / DC Orders   ED Discharge Orders     None        Note:  This document was prepared using Dragon voice recognition software and may include unintentional dictation errors.   Ernest Ronal BRAVO, MD 05/16/24 1452    Ernest Ronal BRAVO, MD 05/16/24 [phone]

## 2024-05-16 NOTE — ED Provider Notes (Signed)
 Clinical Course as of 05/16/24 1904  Thu May 16, 2024  1504 Received signout: diagnosed with c-diff, admitted, placed on abx, oral vanc recently, increasing weakness, dialysis patient. General weakness. Concern for reoccurrance of C-diff.  [HD]  1529 Reached out to infectious disease specialist on call via epic chat regarding any abx recommendations:   [HD]  1556 Per ID specialist Dr. Claressa: the stool cdif on 8/17 was antigen positive and toxin neg and she got 10 days- send another stool for cdiff- if you are suspecting megacolon she will need high dose vanco 500mg  Q6, IV metronidazole  and Likely vanco enema as well. Recommends also getting KUB given CT delay; I had ordered the repeat C-diff testing and also the KUB  [HD]  1557 She recommends: vanco 500mg  PO Q6 and flagyl  500mg  IV q8 until you get a ll the studies back Will order such [HD]  1558 Antibiotics ordered [HD]  1659 1. Circumferential wall thickening of the rectosigmoid colon suspicious for colitis. 2. Findings consistent with colo-uterine fistula, large defect in the posterior uterus which contains stool, and air in the endometrial canal. Recommend surgical consultation.  Will discuss with general surgery on-call   [HD]  1705 Case discussed with General surgery consultant Dr. Tye.  He will reach out to on-call gynecologist to determine whether they can handle this here or whether patient will require transfer. He will call me back [HD]  1716 Patient updated and family updated.  They understand the circumstances [HD]  1726 General Surgery told me that they are incapable of handling this held secondary to the gynecology will reach out to transfer center, likely [facility] but will discuss with patient [HD]  1736 Reaching out to [facility] initially and patient's family in agreement [HD]  1808 Case discussed with Dr. Peggye of general surgery at [facility], they are capable of managing but patient will be hospital admission  admit.  They will page out to medicine [HD]  1833 Accepted by medicine/hospitalist Dr. KYM Bernard [HD]  [phone] Patient updated and agreeable to [facility] [HD]    Clinical Course User Index [HD] Nicholaus Rolland BRAVO, MD   .Critical Care  Performed by: Nicholaus Rolland BRAVO, MD Authorized by: Nicholaus Rolland BRAVO, MD   Critical care provider statement:    Critical care time (minutes):  40   Critical care was necessary to treat or prevent imminent or life-threatening deterioration of the following conditions:  Sepsis   Critical care was time spent personally by me on the following activities:  Development of treatment plan with patient or surrogate, discussions with consultants, evaluation of patient's response to treatment, examination of patient, ordering and review of laboratory studies, ordering and review of radiographic studies, ordering and performing treatments and interventions, pulse oximetry, re-evaluation of patient's condition and review of old charts Comments:     Colitis with Colo uterine fistula.  Requiring touching base with infectious disease, general surgery gynecology     Nicholaus Rolland BRAVO, MD 05/16/24 1904

## 2024-05-17 ENCOUNTER — Other Ambulatory Visit (HOSPITAL_COMMUNITY): Payer: Self-pay

## 2024-05-17 ENCOUNTER — Telehealth (HOSPITAL_COMMUNITY): Payer: Self-pay | Admitting: Pharmacy Technician

## 2024-05-17 DIAGNOSIS — C539 Malignant neoplasm of cervix uteri, unspecified: Secondary | ICD-10-CM

## 2024-05-17 DIAGNOSIS — F03918 Unspecified dementia, unspecified severity, with other behavioral disturbance: Secondary | ICD-10-CM

## 2024-05-17 DIAGNOSIS — A0472 Enterocolitis due to Clostridium difficile, not specified as recurrent: Secondary | ICD-10-CM | POA: Diagnosis not present

## 2024-05-17 DIAGNOSIS — I10 Essential (primary) hypertension: Secondary | ICD-10-CM

## 2024-05-17 DIAGNOSIS — K861 Other chronic pancreatitis: Secondary | ICD-10-CM

## 2024-05-17 DIAGNOSIS — L988 Other specified disorders of the skin and subcutaneous tissue: Secondary | ICD-10-CM | POA: Diagnosis present

## 2024-05-17 DIAGNOSIS — I739 Peripheral vascular disease, unspecified: Secondary | ICD-10-CM

## 2024-05-17 DIAGNOSIS — N824 Other female intestinal-genital tract fistulae: Secondary | ICD-10-CM | POA: Diagnosis not present

## 2024-05-17 LAB — CBC WITH DIFFERENTIAL/PLATELET
Basophils Absolute: 0 K/uL (ref 0.0–0.1)
Basophils Relative: 0 %
Eosinophils Absolute: 0 K/uL (ref 0.0–0.5)
Eosinophils Relative: 0 %
HCT: 31.7 % — ABNORMAL LOW (ref 36.0–46.0)
Hemoglobin: 9.8 g/dL — ABNORMAL LOW (ref 12.0–15.0)
Lymphocytes Relative: 1 %
Lymphs Abs: 0.2 K/uL — ABNORMAL LOW (ref 0.7–4.0)
MCH: 30.2 pg (ref 26.0–34.0)
MCHC: 30.9 g/dL (ref 30.0–36.0)
MCV: 97.8 fL (ref 80.0–100.0)
Monocytes Absolute: 0.2 K/uL (ref 0.1–1.0)
Monocytes Relative: 1 %
Neutro Abs: 21.3 K/uL — ABNORMAL HIGH (ref 1.7–7.7)
Neutrophils Relative %: 98 %
Platelets: 266 K/uL (ref 150–400)
RBC: 3.24 MIL/uL — ABNORMAL LOW (ref 3.87–5.11)
RDW: 16 % — ABNORMAL HIGH (ref 11.5–15.5)
WBC: 21.7 K/uL — ABNORMAL HIGH (ref 4.0–10.5)
nRBC: 0 % (ref 0.0–0.2)

## 2024-05-17 LAB — GLUCOSE, CAPILLARY
Glucose-Capillary: 81 mg/dL (ref 70–99)
Glucose-Capillary: 79 mg/dL (ref 70–99)

## 2024-05-17 LAB — BASIC METABOLIC PANEL WITH GFR
Anion gap: 15 (ref 5–15)
BUN: 23 mg/dL (ref 8–23)
CO2: 23 mmol/L (ref 22–32)
Calcium: 8.6 mg/dL — ABNORMAL LOW (ref 8.9–10.3)
Chloride: 104 mmol/L (ref 98–111)
Creatinine, Ser: 4.94 mg/dL — ABNORMAL HIGH (ref 0.44–1.00)
GFR, Estimated: 9 mL/min — ABNORMAL LOW (ref >60)
Glucose, Bld: 80 mg/dL (ref 70–99)
Potassium: 3.6 mmol/L (ref 3.5–5.1)
Sodium: 142 mmol/L (ref 135–145)

## 2024-05-17 LAB — HEPATIC FUNCTION PANEL
ALT: 8 U/L (ref 0–44)
AST: 13 U/L — ABNORMAL LOW (ref 15–41)
Albumin: 1.9 g/dL — ABNORMAL LOW (ref 3.5–5.0)
Alkaline Phosphatase: 66 U/L (ref 38–126)
Bilirubin, Direct: 0.2 mg/dL (ref 0.0–0.2)
Indirect Bilirubin: 0.9 mg/dL (ref 0.3–0.9)
Total Bilirubin: 1.1 mg/dL (ref 0.0–1.2)
Total Protein: 6 g/dL — ABNORMAL LOW (ref 6.5–8.1)

## 2024-05-17 MED ORDER — CHLORHEXIDINE GLUCONATE CLOTH 2 % EX PADS: 6.0000 | MEDICATED_PAD | Freq: Every day | CUTANEOUS | Status: DC

## 2024-05-17 MED ORDER — DILTIAZEM HCL ER COATED BEADS 180 MG PO CP24
360.0000 mg | ORAL_CAPSULE | Freq: Every day | ORAL | Status: DC
  Administered 2024-05-18 – 2024-06-13 (×24): 360 mg via ORAL
  Filled 2024-05-17 (×25): qty 2

## 2024-05-17 MED ORDER — ATORVASTATIN CALCIUM 40 MG PO TABS
40.0000 mg | ORAL_TABLET | Freq: Every day | ORAL | Status: DC
  Administered 2024-05-17 – 2024-05-29 (×13): 40 mg via ORAL
  Filled 2024-05-17 (×13): qty 1

## 2024-05-17 MED ORDER — METRONIDAZOLE 500 MG/100ML IV SOLN
500.0000 mg | Freq: Two times a day (BID) | INTRAVENOUS | Status: DC
  Administered 2024-05-17 – 2024-05-20 (×8): 500 mg via INTRAVENOUS
  Filled 2024-05-17 (×8): qty 100

## 2024-05-17 MED ORDER — FERROUS SULFATE 325 (65 FE) MG PO TABS
325.0000 mg | ORAL_TABLET | Freq: Every day | ORAL | Status: DC
  Administered 2024-05-17 – 2024-06-13 (×27): 325 mg via ORAL
  Filled 2024-05-17 (×28): qty 1

## 2024-05-17 MED ORDER — CARVEDILOL 25 MG PO TABS
25.0000 mg | ORAL_TABLET | Freq: Two times a day (BID) | ORAL | Status: DC
  Administered 2024-05-17 – 2024-06-13 (×45): 25 mg via ORAL
  Filled 2024-05-17 (×47): qty 1

## 2024-05-17 MED ORDER — PANTOPRAZOLE SODIUM 40 MG PO TBEC
40.0000 mg | DELAYED_RELEASE_TABLET | Freq: Every day | ORAL | Status: DC
  Administered 2024-05-17 – 2024-05-29 (×13): 40 mg via ORAL
  Filled 2024-05-17 (×13): qty 1

## 2024-05-17 MED ORDER — DILTIAZEM HCL ER COATED BEADS 180 MG PO CP24
180.0000 mg | ORAL_CAPSULE | Freq: Every day | ORAL | Status: DC
  Administered 2024-05-17: 180 mg via ORAL
  Filled 2024-05-17: qty 1

## 2024-05-17 MED ORDER — ACETAMINOPHEN 650 MG RE SUPP
650.0000 mg | Freq: Four times a day (QID) | RECTAL | Status: DC | PRN
Start: 2024-05-17 — End: 2024-05-30

## 2024-05-17 MED ORDER — CLONIDINE HCL 0.3 MG/24HR TD PTWK
0.3000 mg | MEDICATED_PATCH | TRANSDERMAL | Status: DC
  Administered 2024-05-17 – 2024-06-07 (×4): 0.3 mg via TRANSDERMAL
  Filled 2024-05-17 (×5): qty 1

## 2024-05-17 MED ORDER — FIDAXOMICIN 200 MG PO TABS
200.0000 mg | ORAL_TABLET | Freq: Two times a day (BID) | ORAL | Status: AC
  Administered 2024-05-17 – 2024-05-26 (×20): 200 mg via ORAL
  Filled 2024-05-17 (×21): qty 1

## 2024-05-17 MED ORDER — MIRTAZAPINE 15 MG PO TABS
7.5000 mg | ORAL_TABLET | Freq: Every day | ORAL | Status: DC
  Administered 2024-05-18 – 2024-05-29 (×13): 7.5 mg via ORAL
  Filled 2024-05-17 (×13): qty 1

## 2024-05-17 MED ORDER — HYDRALAZINE HCL 25 MG PO TABS: 25.0000 mg | ORAL_TABLET | Freq: Four times a day (QID) | ORAL | Status: DC | PRN

## 2024-05-17 MED ORDER — ACETAMINOPHEN 325 MG PO TABS
650.0000 mg | ORAL_TABLET | Freq: Four times a day (QID) | ORAL | Status: DC | PRN
  Administered 2024-05-18 – 2024-05-26 (×6): 650 mg via ORAL
  Filled 2024-05-17 (×6): qty 2

## 2024-05-17 MED ORDER — ORAL CARE MOUTH RINSE: 15.0000 mL | OROMUCOSAL | Status: DC | PRN

## 2024-05-17 MED ORDER — DILTIAZEM HCL ER COATED BEADS 180 MG PO CP24
180.0000 mg | ORAL_CAPSULE | Freq: Once | ORAL | Status: AC
Start: 2024-05-17 — End: 2024-05-17
  Administered 2024-05-17: 180 mg via ORAL
  Filled 2024-05-17: qty 1

## 2024-05-17 MED ORDER — TRAZODONE HCL 50 MG PO TABS
50.0000 mg | ORAL_TABLET | Freq: Every day | ORAL | Status: DC
  Administered 2024-05-17: 50 mg via ORAL
  Filled 2024-05-17: qty 1

## 2024-05-17 MED ORDER — SPIRONOLACTONE 25 MG PO TABS
25.0000 mg | ORAL_TABLET | Freq: Every day | ORAL | Status: DC
  Administered 2024-05-17 – 2024-05-22 (×6): 25 mg via ORAL
  Filled 2024-05-17 (×6): qty 1

## 2024-05-17 MED ORDER — LOSARTAN POTASSIUM 50 MG PO TABS
100.0000 mg | ORAL_TABLET | Freq: Every day | ORAL | Status: DC
  Administered 2024-05-17 – 2024-05-23 (×7): 100 mg via ORAL
  Filled 2024-05-17 (×7): qty 2

## 2024-05-17 NOTE — Consult Note (Signed)
 Tracy Jennings 1956/08/25  [id number].    Requesting MD: Redia Cleaver, MD Chief Complaint/Reason for Consult: C difficile, colouterine fistula  HPI:  Tracy Jennings is a 68 yo female with a history of dementia, ESRD on HD, HTN, and TIA who was recently treated for C difficile colitis in August. She presented to the ED at [facility] yesterday with recurrent diarrhea and abdominal pain, which she says started about a week ago. WBC is 21. KUB did not show colonic dilation. A CT noncontrast CT scan showed signs of colitis with a colouterine fistula. Transfer to [facility] was requested for further management. On my exam this morning, the patient denies any abdominal pain. She has had multiple episodes of diarrhea.  Prior abdominal surgeries include a cholecystectomy and tubal ligation. Colonoscopy in 2019 showed sigmoid diverticulosis and a 4mm tubular adenoma in the ascending colon.  ROS: Review of Systems  Constitutional:  Negative for fever.  Gastrointestinal:  Positive for diarrhea. Negative for abdominal pain.    Family History  Problem Relation Age of Onset   Stroke Mother    Hypertension Mother    Gout Mother    Cancer Father    Diabetes Sister    Diabetes Maternal Grandmother    Diabetes Maternal Grandfather    Diabetes Paternal Grandmother    Diabetes Paternal Grandfather     Past Medical History:  Diagnosis Date   Anemia 04/2018   low iron. to be started on supplements   Cervical cancer (HCC)    CKD (chronic kidney disease)    Stage IV   Complication of anesthesia    receceived too much anesthesia, that she was in coma for a couple days    COVID-19 virus detected 10/28/2019   Diabetes mellitus without complication (HCC)    type II   ESRD (end stage renal disease) (HCC)    Heart murmur    followed as a child only   HSIL (high grade squamous intraepithelial lesion) on Pap smear of cervix    Hyperlipidemia associated with type 2 diabetes mellitus (HCC)    Hypertension     Pancreatitis    Peripheral vascular disease (HCC)     Past Surgical History:  Procedure Laterality Date   AMPUTATION TOE Left 2013   2nd toe. tip of toe (toe nail was infected)   AV FISTULA PLACEMENT Left 05/11/2018   Procedure: ARTERIOVENOUS (AV) FISTULA CREATION;  Surgeon: Jama Cordella MATSU, MD;  Location: [facility] ORS;  Service: Vascular;  Laterality: Left;   CATARACT EXTRACTION     CERVICAL CONIZATION W/BX N/A 04/08/2020   Procedure: CONIZATION CERVIX WITH BIOPSY;  Surgeon: Mancil Barter, MD;  Location: [facility] ORS;  Service: Gynecology;  Laterality: N/A;   CHOLECYSTECTOMY  2014   COLONOSCOPY     COLONOSCOPY WITH PROPOFOL  N/A 01/10/2018   Procedure: COLONOSCOPY WITH PROPOFOL ;  Surgeon: Toledo, Ladell POUR, MD;  Location: [facility] ENDOSCOPY;  Service: Gastroenterology;  Laterality: N/A;   DIALYSIS/PERMA CATHETER INSERTION N/A 05/21/2018   Procedure: DIALYSIS/PERMA CATHETER INSERTION;  Surgeon: Marea Selinda RAMAN, MD;  Location: [facility] INVASIVE CV LAB;  Service: Cardiovascular;  Laterality: N/A;   DIALYSIS/PERMA CATHETER INSERTION N/A 07/14/2020   Procedure: DIALYSIS/PERMA CATHETER INSERTION;  Surgeon: Marea Selinda RAMAN, MD;  Location: [facility] INVASIVE CV LAB;  Service: Cardiovascular;  Laterality: N/A;   DIALYSIS/PERMA CATHETER REMOVAL N/A 04/11/2019   Procedure: DIALYSIS/PERMA CATHETER REMOVAL;  Surgeon: Marea Selinda RAMAN, MD;  Location: [facility] INVASIVE CV LAB;  Service: Cardiovascular;  Laterality: N/A;   EYE SURGERY Bilateral  2018   cataract extractions   IR IMAGING GUIDED PORT INSERTION  06/05/2020   THROMBECTOMY W/ EMBOLECTOMY  05/11/2018   Procedure: THROMBECTOMY ARTERIOVENOUS FISTULA;  Surgeon: Jama Cordella MATSU, MD;  Location: [facility] ORS;  Service: Vascular;;   TUBAL LIGATION  1984    Social History:  reports that she has never smoked. She has never used smokeless tobacco. She reports that she does not drink alcohol and does not use drugs.  Allergies:  Allergies  Allergen Reactions   Amlodipine  Swelling     Knees down to ankles Knees down to ankles   Hctz [Hydrochlorothiazide]     pancreatitis   Heparin  Other (See Comments)    Hx  HIT,   Pt reports cardiac arrest when given heparin    Lmw Heparin      Reports cardiac arrest when given heparin    Other Other (See Comments)    Reports cardiac arrest when given during surgery Reports cardiac arrest when given during surgery Reports cardiac arrest when given during surgery    Sulfa Antibiotics Rash   Lactose    Dairy Aid [Tilactase]     Runny nose   Hydralazine      Nausea and rash    Medications Prior to Admission  Medication Sig Dispense Refill   acetaminophen  (TYLENOL ) 325 MG tablet Take 2 tablets (650 mg total) by mouth every 4 (four) hours as needed for mild pain (pain score 1-3) or fever (or temp > 37.5 C (99.5 F)).     aspirin  EC 81 MG tablet Take 1 tablet (81 mg total) by mouth daily. Swallow whole. 30 tablet 12   atorvastatin  (LIPITOR) 40 MG tablet Take 1 tablet (40 mg total) by mouth daily.     b complex-vitamin c-folic acid  (NEPHRO-VITE) 0.8 MG TABS tablet Take 1 tablet by mouth at bedtime. 30 tablet 2   bisacodyl  (DULCOLAX) 5 MG EC tablet Take 1 tablet (5 mg total) by mouth daily as needed for moderate constipation.     carvedilol  (COREG ) 25 MG tablet Take 1 tablet (25 mg total) by mouth 2 (two) times daily with a meal. 60 tablet 3   cloNIDine  (CATAPRES  - DOSED IN MG/24 HR) 0.3 mg/24hr patch Place 1 patch (0.3 mg total) onto the skin once a week. 4 patch 12   diltiazem  (CARDIZEM  CD) 180 MG 24 hr capsule Take 1 capsule (180 mg total) by mouth daily.     doxycycline (VIBRAMYCIN) 100 MG capsule Take 100 mg by mouth 2 (two) times daily.     ferrous sulfate  325 (65 FE) MG tablet Take 325 mg by mouth daily.     hydrALAZINE  (APRESOLINE ) 25 MG tablet Take 1 tablet (25 mg total) by mouth every 6 (six) hours as needed (SBP >160).     isosorbide  mononitrate (IMDUR ) 120 MG 24 hr tablet Take 120 mg by mouth daily. (Patient not taking: Reported  on 05/16/2024)     losartan  (COZAAR ) 100 MG tablet SMARTSIG:1 Tablet(s) By Mouth Every Evening     pantoprazole  (PROTONIX ) 40 MG tablet Take 1 tablet (40 mg total) by mouth daily.     QUEtiapine  (SEROQUEL ) 100 MG tablet Take 1 tablet (100 mg total) by mouth at bedtime. (Patient not taking: Reported on 05/16/2024)     QUEtiapine  (SEROQUEL ) 25 MG tablet Take 1 tablet (25 mg total) by mouth every 8 (eight) hours as needed (psychosis/anxiety). (Patient not taking: Reported on 05/16/2024)     QUEtiapine  (SEROQUEL ) 25 MG tablet Take 0.5 tablets (12.5 mg total) by mouth  3 (three) times daily. (Patient not taking: Reported on 05/16/2024)     SENEXON-S 8.6-50 MG tablet Take 1 tablet by mouth daily.     spironolactone  (ALDACTONE ) 25 MG tablet Take 1 tablet (25 mg total) by mouth daily.     traZODone  (DESYREL ) 50 MG tablet Take 1 tablet (50 mg total) by mouth at bedtime.     vancomycin  (VANCOCIN ) 125 MG capsule Take 1 capsule (125 mg total) by mouth 4 (four) times daily. 14 capsule 0     Physical Exam: Blood pressure (!) 152/58, pulse 70, height 5' 3 (1.6 m). General: resting comfortably, NAD Neuro: alert, no focal deficits Resp: normal work of breathing on room air Abdomen: soft, nondistended, nontender to deep palpation. Multiple well-healed surgical scars. Extremities: warm and well-perfused   Results for orders placed or performed during the hospital encounter of 05/16/24 (from the past 48 hours)  Basic metabolic panel     Status: Abnormal   Collection Time: 05/17/24 12:47 AM  Result Value Ref Range   Sodium 142 135 - 145 mmol/L   Potassium 3.6 3.5 - 5.1 mmol/L   Chloride 104 98 - 111 mmol/L   CO2 23 22 - 32 mmol/L   Glucose, Bld 80 70 - 99 mg/dL    Comment: Glucose reference range applies only to samples taken after fasting for at least 8 hours.   BUN 23 8 - 23 mg/dL   Creatinine, Ser 5.05 (H) 0.44 - 1.00 mg/dL   Calcium  8.6 (L) 8.9 - 10.3 mg/dL   GFR, Estimated 9 (L) >60 mL/min    Comment:  (NOTE) Calculated using the CKD-EPI Creatinine Equation (2021)    Anion gap 15 5 - 15    Comment: Performed at [facility], [address] [place], [place] [address]  Hepatic function panel     Status: Abnormal   Collection Time: 05/17/24 12:47 AM  Result Value Ref Range   Total Protein 6.0 (L) 6.5 - 8.1 g/dL   Albumin 1.9 (L) 3.5 - 5.0 g/dL   AST 13 (L) 15 - 41 U/L   ALT 8 0 - 44 U/L   Alkaline Phosphatase 66 38 - 126 U/L   Total Bilirubin 1.1 0.0 - 1.2 mg/dL   Bilirubin, Direct 0.2 0.0 - 0.2 mg/dL   Indirect Bilirubin 0.9 0.3 - 0.9 mg/dL    Comment: Performed at [facility], [address], [place], [place] [address]  CBC with Differential/Platelet     Status: Abnormal   Collection Time: 05/17/24 12:47 AM  Result Value Ref Range   WBC 21.7 (H) 4.0 - 10.5 K/uL   RBC 3.24 (L) 3.87 - 5.11 MIL/uL   Hemoglobin 9.8 (L) 12.0 - 15.0 g/dL   HCT 68.2 (L) 63.9 - 53.9 %   MCV 97.8 80.0 - 100.0 fL   MCH 30.2 26.0 - 34.0 pg   MCHC 30.9 30.0 - 36.0 g/dL   RDW 83.9 (H) 88.4 - 84.4 %   Platelets 266 150 - 400 K/uL   nRBC 0.0 0.0 - 0.2 %   Neutrophils Relative % 98 %   Neutro Abs 21.3 (H) 1.7 - 7.7 K/uL   Lymphocytes Relative 1 %   Lymphs Abs 0.2 (L) 0.7 - 4.0 K/uL   Monocytes Relative 1 %   Monocytes Absolute 0.2 0.1 - 1.0 K/uL   Eosinophils Relative 0 %   Eosinophils Absolute 0.0 0.0 - 0.5 K/uL   Basophils Relative 0 %   Basophils Absolute  0.0 0.0 - 0.1 K/uL   WBC Morphology See Note     Comment:  Increased Bands. >20% Bands   Smear Review See Note     Comment:  Normal Platelet Morphology   Polychromasia PRESENT     Comment: Performed at [facility], [address], [place], [place] [address]  Glucose, capillary     Status: None   Collection Time: 05/17/24  1:19 AM  Result Value Ref Range   Glucose-Capillary 81 70 - 99 mg/dL    Comment: Glucose reference range applies only to samples taken after fasting for at least 8 hours.   CT ABDOMEN PELVIS WO  CONTRAST Result Date: 05/16/2024 CLINICAL DATA:  Weakness.  C diff. EXAM: CT ABDOMEN AND PELVIS WITHOUT CONTRAST TECHNIQUE: Multidetector CT imaging of the abdomen and pelvis was performed following the standard protocol without IV contrast. RADIATION DOSE REDUCTION: This exam was performed according to the departmental dose-optimization program which includes automated exposure control, adjustment of the mA and/or kV according to patient size and/or use of iterative reconstruction technique. COMPARISON:  Radiograph earlier today.  CT 04/28/2024 FINDINGS: Lower chest: Increased bilateral pleural effusions, left greater than right. An area of rounded density within the anterior left lower lobe measuring 3.1 cm, series 4, image 8, is more rounded mid left amorphous than on prior exam. Hepatobiliary: Unremarkable unenhanced appearance of the liver. Post cholecystectomy with stable biliary tree. Pancreas: No ductal dilatation or inflammation. Spleen: Normal in size without focal abnormality. Adrenals/Urinary Tract: Stable adrenal thickening. Chronic bilateral renal parenchymal atrophy. No hydronephrosis or renal stone. Minimally distended urinary bladder, diffusely thick walled. Stomach/Bowel: Persistent circumferential wall thickening of the rectosigmoid which is decompressed. Immediately adjacent is an area of colonic distention with stool, however also demonstrates wall thickening. There is a moderate to large volume of formed stool throughout the remainder of the colon, without additional sites of colonic inflammation. No small bowel obstruction or small bowel inflammation. Normal appendix tentatively visualized. Stomach is nondistended Vascular/Lymphatic: Advanced aortic and branch atherosclerosis. No abdominopelvic adenopathy. Reproductive: There is a large amount of air within the endometrial canal, series 2, image 69 and series 6, image 58. Loss of fat plane between the posterior uterus and adjacent sigmoid  colon that demonstrates wall thickening is most consistent with a uterine colonic fistula. Large uterine soft tissue defect with stool in the region of suspected fistula, series 2 image 69 and series 6, image 59. Marked uterine calcifications. Other: Generalized body wall edema, progressive from prior exam. Small volume abdominopelvic ascites. No free intra-abdominal air. Musculoskeletal: The bones are subjectively under mineralized. Stable appearance of L4-L5 with invagination of inferior L4 vertebral body impacted on L5. Stable anterolisthesis at the L4-L5 level with spinal canal narrowing. No new osseous abnormalities. IMPRESSION: 1. Circumferential wall thickening of the rectosigmoid colon suspicious for colitis. 2. Findings consistent with colo-uterine fistula, large defect in the posterior uterus which contains stool, and air in the endometrial canal. Recommend surgical consultation. 3. Increased bilateral pleural effusions, left greater than right. An area of rounded density within the anterior left lower lobe measuring 3.1 cm is more rounded than on prior exam. This may represent rounded atelectasis, however underlying mass is not excluded. Recommend follow-up chest CT after course of treatment. 4. Generalized body wall edema, progressive from prior exam. Small volume abdominopelvic ascites. Aortic Atherosclerosis (ICD10-I70.0). Electronically Signed   By: Andrea Gasman M.D.   On: 05/16/2024 16:49   DG Abdomen 1 View Result Date: 05/16/2024 CLINICAL DATA:  Toxic megacolon. EXAM: ABDOMEN - 1 VIEW COMPARISON:  March 21, 2024.  April 28, 2024. FINDINGS: Status post cholecystectomy. No abnormal bowel dilatation is noted. Large amount of stool seen throughout the colon and rectum. IMPRESSION: Large stool burden. No abnormal bowel dilatation. Electronically Signed   By: Lynwood Landy Raddle M.D.   On: 05/16/2024 16:23   DG Chest Portable 1 View Result Date: 05/16/2024 CLINICAL DATA:  Weakness. EXAM: PORTABLE  CHEST 1 VIEW COMPARISON:  04/01/2024. FINDINGS: Stable cardiomediastinal contours. Stable right chest Port-A-Cath. Moderate left pleural effusion with left basilar opacities favored to reflect atelectasis, increased since the prior exam. Low lung volumes with bronchovascular crowding. No pneumothorax. No acute osseous abnormality. IMPRESSION: Moderate left pleural effusion with left basilar opacities favored to reflect atelectasis, increased since the prior exam. Electronically Signed   By: Harrietta Sherry M.D.   On: 05/16/2024 13:44      Assessment/Plan 68 yo female with C difficile colitis and imaging evidence of a colouterine fistula. I personally reviewed her labs, imaging and notes. Her abdominal exam is benign, and she has no clinical signs of fulminant colitis. No indication for acute surgical intervention for C diff colitis. Her CT scan is suspicious for a colouterine fistula, however this can be managed electively and urgent surgical intervention is not indicated. Recommend continued treatment of C diff and outpatient follow up once patient recovers. Ok for diet advancement as tolerated.   Leonor Dawn, MD [facility] Surgery General, Hepatobiliary and Pancreatic Surgery 05/17/24 6:58 AM

## 2024-05-17 NOTE — Progress Notes (Signed)
 Pt receives out-pt HD at [facility] on MWF 2nd shift. Clinic staff to fax pt's HD orders and med list to inpt HD unit for nephrologist to review per his request. Will assist as needed.   Randine Mungo Dialysis Navigator [phone]

## 2024-05-17 NOTE — Progress Notes (Signed)
 PROGRESS NOTE  Tracy Jennings FMW:[id number] DOB: [birth year]   PCP: George, Sionne A, MD  Patient is from: Home.  Lives alone.  Uses rolling walker at baseline.  Was able to take care of her ADLs including cooking until her recent hospitalization at [facility] in July.   DOA: 05/16/2024 LOS: 1  Chief complaints No chief complaint on file.    Brief Narrative / Interim history: 68 year old F with PMH of cognitive impairment, ESRD on HD MWF, cervical cancer, HTN, HLD, anemia and recent hospitalizations from 6/24-7/22 for hypertensive urgency and delirium, and 8/17-8/24 for C. difficile colitis for which she was treated with p.o. vancomycin  brought back to [facility] from PCP office due to weakness, nausea, diarrhea and abdominal discomfort.  In ED, WBC 21.7.  Hgb 8.3.  Troponin 37.  CT abdomen and pelvis showed circumferential wall thickening of rectosigmoid colon suspicious for colitis, Colo uterine fistula, increased bilateral pleural effusions, left > right and area of rounded density in anterior LLL measuring 3.1 cm, and progressive anasarca.  General surgery at [facility] was consulted and recommended higher level of care and the ER physician discussed with Dr. Dasie general surgeon at [facility] who agreed to see patient in consult.   Patient was started on Dificid  and Flagyl  for C. difficile colitis.  Evaluated by general surgery.    Subjective: Seen and examined earlier this morning.  No major events overnight or this morning.  Patient is sleepy but wakes to voice.  Reports some nausea and abdominal pain pointing that umbilical area.  Not able to describe the pain.  She rates her pain 5/10.  Not sure about diarrhea.  Patient's brother and sister at bedside.  They state that patient has declined since her hospitalization in 03/2024.  She was able to cook for herself and take care of herself before that.  She had a very poor appetite and lost a lot of weight since then.  They are asking if Megace would be  helpful for appetite.  We have discussed risk of VTE with Megace but we can try Remeron  which could help with mood, appetite and sleep.  We will discontinue trazodone .  Objective: Vitals:   05/16/24 2200 05/17/24 1008 05/17/24 1127  BP: (!) 152/58 (!) 174/55 (!) 174/55  Pulse: 70    Height: 5' 3 (1.6 m)      Examination:  GENERAL: Appears frail.  No apparent distress. HEENT: MMM.  Vision and hearing grossly intact.  NECK: Supple.  No apparent JVD.  RESP:  No IWOB.  Fair aeration bilaterally. CVS:  RRR. Heart sounds normal.  ABD/GI/GU: BS+. Abd soft, NTND.  MSK/EXT:  Moves extremities.  Significant muscle mass and subcu fat loss. SKIN: no apparent skin lesion or wound NEURO: Sleepy but wakes to voice.  Oriented to self and family.  She thinks she is at [employer]  Follows commands.  No apparent focal neuro deficit. PSYCH: Calm. Normal affect.   Consultants:  General Surgery Infectious disease   Procedures: None  Microbiology summarized: None  Assessment and plan: C. difficile colitis: Completed 10 days of oral vancomycin  about a week ago.  Ongoing abdominal pain and diarrhea.  CT suggested rectosigmoid colitis.  Significant elevated leukocytosis.  Only bowel movement since arrival at [facility]. 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  Abdominal exam benign. - General Surgery recommends elective management and treating C. difficile. - Advance to full liquid diet  ESRD on HD MWF-last HD on Wednesday.  No emergent need. - Nephrology consulted  Uncontrolled hypertension: On clonidine  patch, Cardizem , losartan , hydralazine , Aldactone  and Coreg .  BP remains elevated. - May need ultrafiltration with dialysis - Continue home meds.  Dementia without behavioral disturbance: Currently sleepy but wakes to voice.  Oriented to self and family. - Reorientation and delirium precaution  Failure to thrive/poor  appetite/unintentional weight loss: family reports significant weight loss since her hospitalization in July 2025.  Not able to quantify.  She has had very poor appetite. - Trial of p.o. Remeron  - Consult dietitian - Treat reversible causes.  Anemia of renal disease - Continue monitoring  History of uterine cancer: - Outpatient follow-up.  Anterior LLL mass/density: CT showed anterior LLL rounded density measuring 3.1 cm - Needs follow-up CT in 4 to 6 weeks  Leukocytosis/bandemia - Antibiotics as above - Continue monitoring  Physical deconditioning - PT/OT eval  Hypokalemia -Monitor replenish K and Mg as appropriate  Body mass index is 22.26 kg/m.           DVT prophylaxis:  SCDs Start: 05/17/24 0000  Code Status: Full code Family Communication: Updated patient's brother and sister at bedside Level of care: Telemetry Medical Status is: Inpatient Remains inpatient appropriate because: C. difficile colitis   Final disposition: To be determined   55 minutes with more than 50% spent in reviewing records, counseling patient/family and coordinating care.   Sch Meds:  Scheduled Meds:  atorvastatin   40 mg Oral Daily   carvedilol   25 mg Oral BID WC   Chlorhexidine  Gluconate Cloth  6 each Topical Daily   cloNIDine   0.3 mg Transdermal Weekly   diltiazem   180 mg Oral Daily   ferrous sulfate   325 mg Oral Daily   fidaxomicin   200 mg Oral BID   losartan   100 mg Oral Daily   mirtazapine   7.5 mg Oral QHS   pantoprazole   40 mg Oral Daily   sodium chloride  flush  10-40 mL Intracatheter Q12H   spironolactone   25 mg Oral Daily   Continuous Infusions:  metronidazole  500 mg (05/17/24 0102)   PRN Meds:.acetaminophen  **OR** acetaminophen , hydrALAZINE , mouth rinse  Antimicrobials: Anti-infectives (From admission, onward)    Start     Dose/Rate Route Frequency Ordered Stop   05/17/24 0100  fidaxomicin  (DIFICID ) tablet 200 mg        200 mg Oral 2 times daily 05/17/24 0003  05/26/24 2159   05/17/24 0100  metroNIDAZOLE  (FLAGYL ) IVPB 500 mg        500 mg 100 mL/hr over 60 Minutes Intravenous Every 12 hours 05/17/24 0003          I have personally reviewed the following labs and images: CBC: Recent Labs  Lab 05/16/24 1308 05/17/24 0047  WBC 21.7* 21.7*  NEUTROABS 20.2* 21.3*  HGB 8.3* 9.8*  HCT 26.6* 31.7*  MCV 97.4 97.8  PLT 252 266   BMP &GFR Recent Labs  Lab 05/16/24 1308 05/17/24 0047  NA 139 142  K 3.4* 3.6  CL 102 104  CO2 24 23  GLUCOSE 86 80  BUN 24* 23  CREATININE 4.34* 4.94*  CALCIUM  8.0* 8.6*   Estimated Creatinine Clearance: 9.3 mL/min (A) (by C-G formula based on SCr of 4.94 mg/dL (H)). Liver & Pancreas: Recent Labs  Lab 05/16/24 1308 05/17/24 0047  AST 12* 13*  ALT 7 8  ALKPHOS 55 66  BILITOT 1.3* 1.1  PROT 5.8* 6.0*  ALBUMIN 2.0* 1.9*   Recent Labs  Lab 05/16/24 1308  LIPASE 22   No results for input(s): AMMONIA in the last 168 hours. Diabetic: No results for input(s): HGBA1C in the last 72 hours. Recent Labs  Lab 05/17/24 0119  GLUCAP 81   Cardiac Enzymes: No results for input(s): CKTOTAL, CKMB, CKMBINDEX, TROPONINI in the last 168 hours. No results for input(s): PROBNP in the last 8760 hours. Coagulation Profile: No results for input(s): INR, PROTIME in the last 168 hours. Thyroid Function Tests: No results for input(s): TSH, T4TOTAL, FREET4, T3FREE, THYROIDAB in the last 72 hours. Lipid Profile: No results for input(s): CHOL, HDL, LDLCALC, TRIG, CHOLHDL, LDLDIRECT in the last 72 hours. Anemia Panel: No results for input(s): VITAMINB12, FOLATE, FERRITIN, TIBC, IRON, RETICCTPCT in the last 72 hours. Urine analysis:    Component Value Date/Time   COLORURINE YELLOW (A) 10/31/2017 1959   APPEARANCEUR HAZY (A) 10/31/2017 1959   APPEARANCEUR Hazy 12/09/2013 1046   LABSPEC 1.017 10/31/2017 1959   LABSPEC 1.031 12/09/2013 1046   PHURINE 6.0  10/31/2017 1959   GLUCOSEU >=500 (A) 10/31/2017 1959   GLUCOSEU >=500 12/09/2013 1046   HGBUR NEGATIVE 10/31/2017 1959   BILIRUBINUR NEGATIVE 10/31/2017 1959   BILIRUBINUR Negative 12/09/2013 1046   KETONESUR NEGATIVE 10/31/2017 1959   PROTEINUR >=300 (A) 10/31/2017 1959   NITRITE NEGATIVE 10/31/2017 1959   LEUKOCYTESUR NEGATIVE 10/31/2017 1959   LEUKOCYTESUR Negative 12/09/2013 1046   Sepsis Labs: Invalid input(s): PROCALCITONIN, LACTICIDVEN  Microbiology: Recent Results (from the past 240 hours)  Blood culture (routine x 2)     Status: None (Preliminary result)   Collection Time: 05/16/24  4:43 PM   Specimen: BLOOD  Result Value Ref Range Status   Specimen Description BLOOD BLOOD RIGHT HAND  Final   Special Requests   Final    BOTTLES DRAWN AEROBIC AND ANAEROBIC BOTTLES DRAWN AEROBIC ONLY   Culture   Final    NO GROWTH < 12 HOURS Performed at [facility], [address] [place], [place] [address]    Report Status PENDING  Incomplete    Radiology Studies: CT ABDOMEN PELVIS WO CONTRAST Result Date: 05/16/2024 CLINICAL DATA:  Weakness.  C diff. EXAM: CT ABDOMEN AND PELVIS WITHOUT CONTRAST TECHNIQUE: Multidetector CT imaging of the abdomen and pelvis was performed following the standard protocol without IV contrast. RADIATION DOSE REDUCTION: This exam was performed according to the departmental dose-optimization program which includes automated exposure control, adjustment of the mA and/or kV according to patient size and/or use of iterative reconstruction technique. COMPARISON:  Radiograph earlier today.  CT 04/28/2024 FINDINGS: Lower chest: Increased bilateral pleural effusions, left greater than right. An area of rounded density within the anterior left lower lobe measuring 3.1 cm, series 4, image 8, is more rounded mid left amorphous than on prior exam. Hepatobiliary: Unremarkable unenhanced appearance of the liver. Post cholecystectomy with stable biliary tree.  Pancreas: No ductal dilatation or inflammation. Spleen: Normal in size without focal abnormality. Adrenals/Urinary Tract: Stable adrenal thickening. Chronic bilateral renal parenchymal atrophy. No hydronephrosis or renal stone. Minimally distended urinary bladder, diffusely thick walled. Stomach/Bowel: Persistent circumferential wall thickening of the rectosigmoid which is decompressed. Immediately adjacent is an area of colonic distention with stool, however also demonstrates wall thickening. There is a moderate to large volume of formed stool throughout the remainder of the colon, without additional sites of colonic inflammation. No small bowel obstruction or small bowel inflammation. Normal appendix tentatively visualized. Stomach is nondistended Vascular/Lymphatic: Advanced aortic and branch atherosclerosis. No abdominopelvic adenopathy. Reproductive: There is  a large amount of air within the endometrial canal, series 2, image 69 and series 6, image 58. 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 Large uterine soft tissue defect with stool in the region of suspected fistula, series 2 image 69 and series 6, image 59. Marked uterine calcifications. Other: Generalized body wall edema, progressive from prior exam. Small volume abdominopelvic ascites. No free intra-abdominal air. Musculoskeletal: The bones are subjectively under mineralized. Stable appearance of L4-L5 with invagination of inferior L4 vertebral body impacted on L5. Stable anterolisthesis at the L4-L5 level with spinal canal narrowing. No new osseous abnormalities. IMPRESSION: 1. Circumferential wall thickening of the rectosigmoid colon suspicious for colitis. 2. Findings consistent with colo-uterine fistula, large defect in the posterior uterus which contains stool, and air in the endometrial canal. Recommend surgical consultation. 3.  Increased bilateral pleural effusions, left greater than right. An area of rounded density within the anterior left lower lobe measuring 3.1 cm is more rounded than on prior exam. This may represent rounded atelectasis, however underlying mass is not excluded. Recommend follow-up chest CT after course of treatment. 4. Generalized body wall edema, progressive from prior exam. Small volume abdominopelvic ascites. Aortic Atherosclerosis (ICD10-I70.0). Electronically Signed   By: Andrea Gasman M.D.   On: 05/16/2024 16:49   DG Abdomen 1 View Result Date: 05/16/2024 CLINICAL DATA:  Toxic megacolon. EXAM: ABDOMEN - 1 VIEW COMPARISON:  March 21, 2024.  April 28, 2024. FINDINGS: Status post cholecystectomy. No abnormal bowel dilatation is noted. Large amount of stool seen throughout the colon and rectum. IMPRESSION: Large stool burden. No abnormal bowel dilatation. Electronically Signed   By: Lynwood Landy Raddle M.D.   On: 05/16/2024 16:23   DG Chest Portable 1 View Result Date: 05/16/2024 CLINICAL DATA:  Weakness. EXAM: PORTABLE CHEST 1 VIEW COMPARISON:  04/01/2024. FINDINGS: Stable cardiomediastinal contours. Stable right chest Port-A-Cath. Moderate left pleural effusion with left basilar opacities favored to reflect atelectasis, increased since the prior exam. Low lung volumes with bronchovascular crowding. No pneumothorax. No acute osseous abnormality. IMPRESSION: Moderate left pleural effusion with left basilar opacities favored to reflect atelectasis, increased since the prior exam. Electronically Signed   By: Harrietta Sherry M.D.   On: 05/16/2024 13:44      Jaidin Ugarte T. Maelin Kurkowski [facility]  If 7PM-7AM, please contact night-coverage [email or web address] 05/17/2024, 11:57 AM

## 2024-05-17 NOTE — Telephone Encounter (Signed)
 Patient Product/process development scientist completed.    The patient is insured through [facility]. Patient has Medicare and is not eligible for a copay card, but may be able to apply for patient assistance or Medicare RX Payment Plan (Patient Must reach out to their plan, if eligible for payment plan), if available.    Ran test claim for Dificid  200 mg and the current 10 day co-pay is $1,618.14.   This test claim was processed through [facility]- copay amounts may vary at other pharmacies due to pharmacy/plan contracts, or as the patient moves through the different stages of their insurance plan.     Reyes Sharps, CPHT Pharmacy Technician III Certified Patient Advocate [facility] Patient Advocate Team Direct Number: [phone]  Fax: [phone]

## 2024-05-17 NOTE — Progress Notes (Signed)
Report given to dialysis nurse.

## 2024-05-17 NOTE — H&P (Incomplete)
 History and Physical    LASHAI Jennings FMW:[id number] DOB: 1956-01-17 DOA: 05/16/2024  Patient coming from: ***  Chief Complaint: ***  HPI: Tracy Jennings is a 68 y.o. female with ***   ED Course: ***  Review of Systems: As per HPI, rest all negative.   Past Medical History:  Diagnosis Date  . Anemia 04/2018   low iron. to be started on supplements  . Cervical cancer (HCC)   . CKD (chronic kidney disease)    Stage IV  . Complication of anesthesia    receceived too much anesthesia, that she was in coma for a couple days   . COVID-19 virus detected 10/28/2019  . Diabetes mellitus without complication (HCC)    type II  . ESRD (end stage renal disease) (HCC)   . Heart murmur    followed as a child only  . HSIL (high grade squamous intraepithelial lesion) on Pap smear of cervix   . Hyperlipidemia associated with type 2 diabetes mellitus (HCC)   . Hypertension   . Pancreatitis   . Peripheral vascular disease [facility])     Past Surgical History:  Procedure Laterality Date  . AMPUTATION TOE Left 2013   2nd toe. tip of toe (toe nail was infected)  . AV FISTULA PLACEMENT Left 05/11/2018   Procedure: ARTERIOVENOUS (AV) FISTULA CREATION;  Surgeon: Jama Cordella MATSU, MD;  Location: [facility] ORS;  Service: Vascular;  Laterality: Left;  . CATARACT EXTRACTION    . CERVICAL CONIZATION W/BX N/A 04/08/2020   Procedure: CONIZATION CERVIX WITH BIOPSY;  Surgeon: Mancil Barter, MD;  Location: [facility];  Service: Gynecology;  Laterality: N/A;  . CHOLECYSTECTOMY  2014  . COLONOSCOPY    . COLONOSCOPY WITH PROPOFOL  N/A 01/10/2018   Procedure: COLONOSCOPY WITH PROPOFOL ;  Surgeon: Toledo, Ladell POUR, MD;  Location: [facility];  Service: Gastroenterology;  Laterality: N/A;  . DIALYSIS/PERMA CATHETER INSERTION N/A 05/21/2018   Procedure: DIALYSIS/PERMA CATHETER INSERTION;  Surgeon: Marea Selinda RAMAN, MD;  Location: [facility];  Service: Cardiovascular;  Laterality: N/A;  . DIALYSIS/PERMA  CATHETER INSERTION N/A 07/14/2020   Procedure: DIALYSIS/PERMA CATHETER INSERTION;  Surgeon: Marea Selinda RAMAN, MD;  Location: [facility];  Service: Cardiovascular;  Laterality: N/A;  . DIALYSIS/PERMA CATHETER REMOVAL N/A 04/11/2019   Procedure: DIALYSIS/PERMA CATHETER REMOVAL;  Surgeon: Marea Selinda RAMAN, MD;  Location: [facility];  Service: Cardiovascular;  Laterality: N/A;  . EYE SURGERY Bilateral 2018   cataract extractions  . IR IMAGING GUIDED PORT INSERTION  06/05/2020  . THROMBECTOMY W/ EMBOLECTOMY  05/11/2018   Procedure: THROMBECTOMY ARTERIOVENOUS FISTULA;  Surgeon: Jama Cordella MATSU, MD;  Location: [facility];  Service: Vascular;;  . TUBAL LIGATION  1984     reports that she has never smoked. She has never used smokeless tobacco. She reports that she does not drink alcohol and does not use drugs.  Allergies  Allergen Reactions  . Amlodipine  Swelling    Knees down to ankles Knees down to ankles  . Hctz [Hydrochlorothiazide]     pancreatitis  . Heparin  Other (See Comments)    Hx  HIT,   Pt reports cardiac arrest when given heparin   . Lmw Heparin      Reports cardiac arrest when given heparin   . Other Other (See Comments)    Reports cardiac arrest when given during surgery Reports cardiac arrest when given during surgery Reports cardiac arrest when given during surgery   . Sulfa Antibiotics Rash  . Lactose   .  Dairy Aid [Tilactase]     Runny nose  . Hydralazine      Nausea and rash    Family History  Problem Relation Age of Onset  . Stroke Mother   . Hypertension Mother   . Gout Mother   . Cancer Father   . Diabetes Sister   . Diabetes Maternal Grandmother   . Diabetes Maternal Grandfather   . Diabetes Paternal Grandmother   . Diabetes Paternal Grandfather     Prior to Admission medications   Medication Sig Start Date End Date Taking? Authorizing Provider  acetaminophen  (TYLENOL ) 325 MG tablet Take 2 tablets (650 mg total) by mouth every 4 (four) hours as  needed for mild pain (pain score 1-3) or fever (or temp > 37.5 C (99.5 F)). 04/02/24   Dorinda Drue DASEN, MD  aspirin  EC 81 MG tablet Take 1 tablet (81 mg total) by mouth daily. Swallow whole. 04/02/24   Dorinda Drue DASEN, MD  atorvastatin  (LIPITOR) 40 MG tablet Take 1 tablet (40 mg total) by mouth daily. 04/03/24   Dorinda Drue DASEN, MD  b complex-vitamin c-folic acid  (NEPHRO-VITE) 0.8 MG TABS tablet Take 1 tablet by mouth at bedtime. 05/05/24   Franchot Novel, MD  bisacodyl  (DULCOLAX) 5 MG EC tablet Take 1 tablet (5 mg total) by mouth daily as needed for moderate constipation. 04/02/24   Dorinda Drue DASEN, MD  carvedilol  (COREG ) 25 MG tablet Take 1 tablet (25 mg total) by mouth 2 (two) times daily with a meal. 04/01/22   Krishnan, Sendil K, MD  cloNIDine  (CATAPRES  - DOSED IN MG/24 HR) 0.3 mg/24hr patch Place 1 patch (0.3 mg total) onto the skin once a week. 04/07/24   Dorinda Drue DASEN, MD  diltiazem  (CARDIZEM  CD) 180 MG 24 hr capsule Take 1 capsule (180 mg total) by mouth daily. 04/03/24   Dorinda Drue DASEN, MD  doxycycline (VIBRAMYCIN) 100 MG capsule Take 100 mg by mouth 2 (two) times daily. 04/23/24   [provider]  ferrous sulfate  325 (65 FE) MG tablet Take 325 mg by mouth daily. 04/23/24   [provider]  hydrALAZINE  (APRESOLINE ) 25 MG tablet Take 1 tablet (25 mg total) by mouth every 6 (six) hours as needed (SBP >160). 04/02/24   Dorinda Drue DASEN, MD  isosorbide  mononitrate (IMDUR ) 120 MG 24 hr tablet Take 120 mg by mouth daily. Patient not taking: Reported on 05/16/2024 04/28/24   [provider]  losartan  (COZAAR ) 100 MG tablet SMARTSIG:1 Tablet(s) By Mouth Every Evening 02/03/22   [provider]  pantoprazole  (PROTONIX ) 40 MG tablet Take 1 tablet (40 mg total) by mouth daily. 04/02/24   Dorinda Drue DASEN, MD  QUEtiapine  (SEROQUEL ) 100 MG tablet Take 1 tablet (100 mg total) by mouth at bedtime. Patient not taking: Reported on 05/16/2024 04/02/24   Dorinda Drue DASEN, MD  QUEtiapine  (SEROQUEL )  25 MG tablet Take 1 tablet (25 mg total) by mouth every 8 (eight) hours as needed (psychosis/anxiety). Patient not taking: Reported on 05/16/2024 04/02/24   Dorinda Drue DASEN, MD  QUEtiapine  (SEROQUEL ) 25 MG tablet Take 0.5 tablets (12.5 mg total) by mouth 3 (three) times daily. Patient not taking: Reported on 05/16/2024 05/05/24   Franchot Novel, MD  SENEXON-S 8.6-50 MG tablet Take 1 tablet by mouth daily. 04/23/24   [provider]  spironolactone  (ALDACTONE ) 25 MG tablet Take 1 tablet (25 mg total) by mouth daily. 04/03/24   Dorinda Drue DASEN, MD  traZODone  (DESYREL ) 50 MG tablet Take 1 tablet (50  mg total) by mouth at bedtime. 04/02/24   Dorinda Drue DASEN, MD  vancomycin  (VANCOCIN ) 125 MG capsule Take 1 capsule (125 mg total) by mouth 4 (four) times daily. 05/05/24   Franchot Novel, MD    Physical Exam: Constitutional: *** Vitals:   05/16/24 2200  BP: (!) 152/58  Pulse: 70   Eyes: *** ENMT: *** Neck: *** Respiratory: *** Cardiovascular: *** Abdomen: *** Musculoskeletal: *** Skin: *** Neurologic:*** Psychiatric: ***   Labs on Admission: I have personally reviewed following labs and imaging studies  CBC: Recent Labs  Lab 05/16/24 1308  WBC 21.7*  NEUTROABS 20.2*  HGB 8.3*  HCT 26.6*  MCV 97.4  PLT 252   Basic Metabolic Panel: Recent Labs  Lab 05/16/24 1308  NA 139  K 3.4*  CL 102  CO2 24  GLUCOSE 86  BUN 24*  CREATININE 4.34*  CALCIUM  8.0*   GFR: Estimated Creatinine Clearance: 10.5 mL/min (A) (by C-G formula based on SCr of 4.34 mg/dL (H)). Liver Function Tests: Recent Labs  Lab 05/16/24 1308  AST 12*  ALT 7  ALKPHOS 55  BILITOT 1.3*  PROT 5.8*  ALBUMIN 2.0*   Recent Labs  Lab 05/16/24 1308  LIPASE 22   No results for input(s): AMMONIA in the last 168 hours. Coagulation Profile: No results for input(s): INR, PROTIME in the last 168 hours. Cardiac Enzymes: No results for input(s): CKTOTAL, CKMB, CKMBINDEX, TROPONINI in the last  168 hours. BNP (last 3 results) No results for input(s): PROBNP in the last 8760 hours. HbA1C: No results for input(s): HGBA1C in the last 72 hours. CBG: No results for input(s): GLUCAP in the last 168 hours. Lipid Profile: No results for input(s): CHOL, HDL, LDLCALC, TRIG, CHOLHDL, LDLDIRECT in the last 72 hours. Thyroid Function Tests: No results for input(s): TSH, T4TOTAL, FREET4, T3FREE, THYROIDAB in the last 72 hours. Anemia Panel: No results for input(s): VITAMINB12, FOLATE, FERRITIN, TIBC, IRON, RETICCTPCT in the last 72 hours. Urine analysis:    Component Value Date/Time   COLORURINE YELLOW (A) 10/31/2017 1959   APPEARANCEUR HAZY (A) 10/31/2017 1959   APPEARANCEUR Hazy 12/09/2013 1046   LABSPEC 1.017 10/31/2017 1959   LABSPEC 1.031 12/09/2013 1046   PHURINE 6.0 10/31/2017 1959   GLUCOSEU >=500 (A) 10/31/2017 1959   GLUCOSEU >=500 12/09/2013 1046   HGBUR NEGATIVE 10/31/2017 1959   BILIRUBINUR NEGATIVE 10/31/2017 1959   BILIRUBINUR Negative 12/09/2013 1046   KETONESUR NEGATIVE 10/31/2017 1959   PROTEINUR >=300 (A) 10/31/2017 1959   NITRITE NEGATIVE 10/31/2017 1959   LEUKOCYTESUR NEGATIVE 10/31/2017 1959   LEUKOCYTESUR Negative 12/09/2013 1046   Sepsis Labs: @LABRCNTIP (procalcitonin:4,lacticidven:4) )No results found for this or any previous visit (from the past 240 hours).   Radiological Exams on Admission: CT ABDOMEN PELVIS WO CONTRAST Result Date: 05/16/2024 CLINICAL DATA:  Weakness.  C diff. EXAM: CT ABDOMEN AND PELVIS WITHOUT CONTRAST TECHNIQUE: Multidetector CT imaging of the abdomen and pelvis was performed following the standard protocol without IV contrast. RADIATION DOSE REDUCTION: This exam was performed according to the departmental dose-optimization program which includes automated exposure control, adjustment of the mA and/or kV according to patient size and/or use of iterative reconstruction technique. COMPARISON:   Radiograph earlier today.  CT 04/28/2024 FINDINGS: Lower chest: Increased bilateral pleural effusions, left greater than right. An area of rounded density within the anterior left lower lobe measuring 3.1 cm, series 4, image 8, is more rounded mid left amorphous than on prior exam. Hepatobiliary: Unremarkable unenhanced appearance of the liver. Post  cholecystectomy with stable biliary tree. Pancreas: No ductal dilatation or inflammation. Spleen: Normal in size without focal abnormality. Adrenals/Urinary Tract: Stable adrenal thickening. Chronic bilateral renal parenchymal atrophy. No hydronephrosis or renal stone. Minimally distended urinary bladder, diffusely thick walled. Stomach/Bowel: Persistent circumferential wall thickening of the rectosigmoid which is decompressed. Immediately adjacent is an area of colonic distention with stool, however also demonstrates wall thickening. There is a moderate to large volume of formed stool throughout the remainder of the colon, without additional sites of colonic inflammation. No small bowel obstruction or small bowel inflammation. Normal appendix tentatively visualized. Stomach is nondistended Vascular/Lymphatic: Advanced aortic and branch atherosclerosis. No abdominopelvic adenopathy. Reproductive: There is a large amount of air within the endometrial canal, series 2, image 69 and series 6, image 58. 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 Large uterine soft tissue defect with stool in the region of suspected fistula, series 2 image 69 and series 6, image 59. Marked uterine calcifications. Other: Generalized body wall edema, progressive from prior exam. Small volume abdominopelvic ascites. No free intra-abdominal air. Musculoskeletal: The bones are subjectively under mineralized. Stable appearance of L4-L5 with invagination of inferior L4 vertebral body  impacted on L5. Stable anterolisthesis at the L4-L5 level with spinal canal narrowing. No new osseous abnormalities. IMPRESSION: 1. Circumferential wall thickening of the rectosigmoid colon suspicious for colitis. 2. Findings consistent with colo-uterine fistula, large defect in the posterior uterus which contains stool, and air in the endometrial canal. Recommend surgical consultation. 3. Increased bilateral pleural effusions, left greater than right. An area of rounded density within the anterior left lower lobe measuring 3.1 cm is more rounded than on prior exam. This may represent rounded atelectasis, however underlying mass is not excluded. Recommend follow-up chest CT after course of treatment. 4. Generalized body wall edema, progressive from prior exam. Small volume abdominopelvic ascites. Aortic Atherosclerosis (ICD10-I70.0). Electronically Signed   By: Andrea Gasman M.D.   On: 05/16/2024 16:49   DG Abdomen 1 View Result Date: 05/16/2024 CLINICAL DATA:  Toxic megacolon. EXAM: ABDOMEN - 1 VIEW COMPARISON:  March 21, 2024.  April 28, 2024. FINDINGS: Status post cholecystectomy. No abnormal bowel dilatation is noted. Large amount of stool seen throughout the colon and rectum. IMPRESSION: Large stool burden. No abnormal bowel dilatation. Electronically Signed   By: Lynwood Landy Raddle M.D.   On: 05/16/2024 16:23   DG Chest Portable 1 View Result Date: 05/16/2024 CLINICAL DATA:  Weakness. EXAM: PORTABLE CHEST 1 VIEW COMPARISON:  04/01/2024. FINDINGS: Stable cardiomediastinal contours. Stable right chest Port-A-Cath. Moderate left pleural effusion with left basilar opacities favored to reflect atelectasis, increased since the prior exam. Low lung volumes with bronchovascular crowding. No pneumothorax. No acute osseous abnormality. IMPRESSION: Moderate left pleural effusion with left basilar opacities favored to reflect atelectasis, increased since the prior exam. Electronically Signed   By: Harrietta Sherry  M.D.   On: 05/16/2024 13:44    EKG: Independently reviewed. ***  Assessment/Plan Principal Problem:   Colouterine fistula Active Problems:   C. difficile colitis   Dementia with behavioral disturbance (HCC)   Essential hypertension   ESRD (end stage renal disease) (HCC)   Chronic recurrent pancreatitis (HCC)   PVD (peripheral vascular disease) (HCC)   Type 2 diabetes mellitus with other specified complication (HCC)   Cervical cancer (HCC)    ***   DVT prophylaxis: *** Code Status: ***  Family Communication: ***  Disposition Plan: ***  Consults called: ***  Admission status: ***

## 2024-05-17 NOTE — Progress Notes (Signed)
 Patient blood sugar 79 at this time. CBG machine is having an issue transferring information over to patient's chart.

## 2024-05-17 NOTE — Progress Notes (Signed)
 Report given to dialysis nurse. Consent given by sister [name] Glatter @[id number]  05/17/24 with Daina DASEN, RN as a witness.

## 2024-05-17 NOTE — Telephone Encounter (Signed)
 Patient Advocate Encounter  Patient is approved through the [employer] Patient Assistance Program for [facility]  through 09/11/2024.   Medication will be delivered to Patient's home   Reyes Sharps, CPhT Pharmacy Patient Advocate Specialist [facility] Patient Advocate Team Direct Number: [phone]  Fax: [phone]

## 2024-05-17 NOTE — Consult Note (Signed)
 Renal Service Consult Note [facility] Tracy JONETTA Fret, MD  Patient: Tracy Jennings Date: 05/17/2024 Requesting Physician: Dr. Kathrin  Reason for Consult: ESRD pt w/ colo-uterine fistula HPI: The patient is a 68 y.o. year-old w/ PMH as below who presented to ED at [address] [facility] yesterday for gen'd weakness for the last several days.  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  Also history of cervical cancer, ESRD on HD, dementia, hypertension, TIA.  In the ED WBC 22K, Hgb 8.3, troponins flat, K+ 3.1, creatinine 4.3.  LA was normal.  BP 159/76, HR 78, RR 18, temp 98.  95% on room air.  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  The patient was transferred to [facility] due to need for higher level of care.  Patient was admitted.  We are asked to see for dialysis.   Pt seen in room.  Family is at the bedside.  Patient is resting and does not give elaborate answers to questions.  Minimal history obtained.  Patient has no complaints.   ROS - n/a   Past Medical History  Past Medical History:  Diagnosis Date   Anemia 04/2018   low iron. to be started on supplements   Cervical cancer (HCC)    CKD (chronic kidney disease)    Stage IV   Complication of anesthesia    receceived too much anesthesia, that she was in coma for a couple days    COVID-19 virus detected 10/28/2019   Diabetes mellitus without complication (HCC)    type II   ESRD (end stage renal disease) (HCC)    Heart murmur    followed as a child only   HSIL (high grade squamous intraepithelial lesion) on Pap smear of cervix    Hyperlipidemia associated with type 2 diabetes mellitus (HCC)    Hypertension    Pancreatitis    Peripheral vascular disease (HCC)    Past Surgical History  Past Surgical History:  Procedure Laterality Date   AMPUTATION TOE Left 2013   2nd toe. tip of toe (toe nail was infected)   AV FISTULA PLACEMENT Left 05/11/2018    Procedure: ARTERIOVENOUS (AV) FISTULA CREATION;  Surgeon: Jama Cordella MATSU, MD;  Location: [facility] ORS;  Service: Vascular;  Laterality: Left;   CATARACT EXTRACTION     CERVICAL CONIZATION W/BX N/A 04/08/2020   Procedure: CONIZATION CERVIX WITH BIOPSY;  Surgeon: Mancil Barter, MD;  Location: [facility] ORS;  Service: Gynecology;  Laterality: N/A;   CHOLECYSTECTOMY  2014   COLONOSCOPY     COLONOSCOPY WITH PROPOFOL  N/A 01/10/2018   Procedure: COLONOSCOPY WITH PROPOFOL ;  Surgeon: Toledo, Ladell POUR, MD;  Location: [facility] ENDOSCOPY;  Service: Gastroenterology;  Laterality: N/A;   DIALYSIS/PERMA CATHETER INSERTION N/A 05/21/2018   Procedure: DIALYSIS/PERMA CATHETER INSERTION;  Surgeon: Marea Selinda RAMAN, MD;  Location: [facility] INVASIVE CV LAB;  Service: Cardiovascular;  Laterality: N/A;   DIALYSIS/PERMA CATHETER INSERTION N/A 07/14/2020   Procedure: DIALYSIS/PERMA CATHETER INSERTION;  Surgeon: Marea Selinda RAMAN, MD;  Location: [facility] INVASIVE CV LAB;  Service: Cardiovascular;  Laterality: N/A;   DIALYSIS/PERMA CATHETER REMOVAL N/A 04/11/2019   Procedure: DIALYSIS/PERMA CATHETER REMOVAL;  Surgeon: Marea Selinda RAMAN, MD;  Location: [facility] INVASIVE CV LAB;  Service: Cardiovascular;  Laterality: N/A;   EYE SURGERY Bilateral 2018   cataract extractions   IR IMAGING GUIDED PORT INSERTION  06/05/2020   THROMBECTOMY W/ EMBOLECTOMY  05/11/2018   Procedure: THROMBECTOMY ARTERIOVENOUS FISTULA;  Surgeon: Jama Cordella  G, MD;  Location: [facility] ORS;  Service: Vascular;;   TUBAL LIGATION  1984   Family History  Family History  Problem Relation Age of Onset   Stroke Mother    Hypertension Mother    Gout Mother    Cancer Father    Diabetes Sister    Diabetes Maternal Grandmother    Diabetes Maternal Grandfather    Diabetes Paternal Grandmother    Diabetes Paternal Grandfather    Social History  reports that she has never smoked. She has never used smokeless tobacco. She reports that she does not drink alcohol and does not use  drugs. Allergies  Allergies  Allergen Reactions   Amlodipine  Swelling    Knees down to ankles Knees down to ankles   Hctz [Hydrochlorothiazide]     pancreatitis   Heparin  Other (See Comments)    Hx  HIT,   Pt reports cardiac arrest when given heparin    Lmw Heparin      Reports cardiac arrest when given heparin    Other Other (See Comments)    Reports cardiac arrest when given during surgery Reports cardiac arrest when given during surgery Reports cardiac arrest when given during surgery    Sulfa Antibiotics Rash   Lactose    Dairy Aid [Tilactase]     Runny nose   Hydralazine      Nausea and rash   Home medications Prior to Admission medications   Medication Sig Start Date End Date Taking? Authorizing Provider  acetaminophen  (TYLENOL ) 325 MG tablet Take 2 tablets (650 mg total) by mouth every 4 (four) hours as needed for mild pain (pain score 1-3) or fever (or temp > 37.5 C (99.5 F)). 04/02/24   Dorinda Drue DASEN, MD  aspirin  EC 81 MG tablet Take 1 tablet (81 mg total) by mouth daily. Swallow whole. 04/02/24   Dorinda Drue DASEN, MD  atorvastatin  (LIPITOR) 40 MG tablet Take 1 tablet (40 mg total) by mouth daily. 04/03/24   Dorinda Drue DASEN, MD  b complex-vitamin c-folic acid  (NEPHRO-VITE) 0.8 MG TABS tablet Take 1 tablet by mouth at bedtime. 05/05/24   Franchot Novel, MD  bisacodyl  (DULCOLAX) 5 MG EC tablet Take 1 tablet (5 mg total) by mouth daily as needed for moderate constipation. 04/02/24   Dorinda Drue DASEN, MD  carvedilol  (COREG ) 25 MG tablet Take 1 tablet (25 mg total) by mouth 2 (two) times daily with a meal. 04/01/22   Krishnan, Sendil K, MD  cloNIDine  (CATAPRES  - DOSED IN MG/24 HR) 0.3 mg/24hr patch Place 1 patch (0.3 mg total) onto the skin once a week. 04/07/24   Dorinda Drue DASEN, MD  diltiazem  (CARDIZEM  CD) 180 MG 24 hr capsule Take 1 capsule (180 mg total) by mouth daily. 04/03/24   Dorinda Drue DASEN, MD  doxycycline (VIBRAMYCIN) 100 MG capsule Take 100 mg by mouth 2 (two) times daily.  04/23/24   [provider]  ferrous sulfate  325 (65 FE) MG tablet Take 325 mg by mouth daily. 04/23/24   [provider]  hydrALAZINE  (APRESOLINE ) 25 MG tablet Take 1 tablet (25 mg total) by mouth every 6 (six) hours as needed (SBP >160). 04/02/24   Dorinda Drue DASEN, MD  isosorbide  mononitrate (IMDUR ) 120 MG 24 hr tablet Take 120 mg by mouth daily. Patient not taking: Reported on 05/16/2024 04/28/24   [provider]  losartan  (COZAAR ) 100 MG tablet SMARTSIG:1 Tablet(s) By Mouth Every Evening 02/03/22   [provider]  pantoprazole  (PROTONIX ) 40 MG tablet Take 1 tablet (  40 mg total) by mouth daily. 04/02/24   Dorinda Drue DASEN, MD  QUEtiapine  (SEROQUEL ) 100 MG tablet Take 1 tablet (100 mg total) by mouth at bedtime. Patient not taking: Reported on 05/16/2024 04/02/24   Dorinda Drue DASEN, MD  QUEtiapine  (SEROQUEL ) 25 MG tablet Take 1 tablet (25 mg total) by mouth every 8 (eight) hours as needed (psychosis/anxiety). Patient not taking: Reported on 05/16/2024 04/02/24   Dorinda Drue DASEN, MD  QUEtiapine  (SEROQUEL ) 25 MG tablet Take 0.5 tablets (12.5 mg total) by mouth 3 (three) times daily. Patient not taking: Reported on 05/16/2024 05/05/24   Franchot Novel, MD  SENEXON-S 8.6-50 MG tablet Take 1 tablet by mouth daily. 04/23/24   [provider]  spironolactone  (ALDACTONE ) 25 MG tablet Take 1 tablet (25 mg total) by mouth daily. 04/03/24   Dorinda Drue DASEN, MD  traZODone  (DESYREL ) 50 MG tablet Take 1 tablet (50 mg total) by mouth at bedtime. 04/02/24   Dorinda Drue DASEN, MD  vancomycin  (VANCOCIN ) 125 MG capsule Take 1 capsule (125 mg total) by mouth 4 (four) times daily. 05/05/24   Franchot Novel, MD     Vitals:   05/16/24 2200 05/17/24 1008 05/17/24 1127  BP: (!) 152/58 (!) 174/55 (!) 174/55  Pulse: 70    Height: 5' 3 (1.6 m)     Exam Gen alert, no distress Sclera anicteric, throat clear  No jvd or bruits Chest clear bilat to bases RRR no MRG Abd soft ntnd no mass or  ascites +bs Ext no LE or UE edema, no other edema Neuro is alert , nonfocal    ABG + bruit  Home bp meds: Coreg  25 mg bid Catapres  patch 0.3 weekly Cardizem  CD 180 every day Hydralazine  25 qid prn sbp > 160 Losartan  100 every day  Aldactone  25mg  every day    OP HD: MWF [facility], other records pending   Assessment/ Plan: Colo-uterine fistula: per abd CT. Seen by gen surg. Per surgery/ pmd Cdif colitis: per pmd ESRD: on HD MWF. Last HD Wed. No acute needs for HD. Plan HD tonight or at latest tomorrow am.  HTN: on multiple BP lowering meds at home and here. BP's stable.  Volume: euvolemic on exam, no edema, CXR no edema. UF 2-3 L as tol w/ next HD.  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        Myer Fret  MD [facility] 05/17/2024, 3:48 PM  Recent Labs  Lab 05/16/24 1308 05/17/24 0047  HGB 8.3* 9.8*  ALBUMIN 2.0* 1.9*  CALCIUM  8.0* 8.6*  CREATININE 4.34* 4.94*  K 3.4* 3.6   Inpatient medications:  atorvastatin   40 mg Oral Daily   carvedilol   25 mg Oral BID WC   Chlorhexidine  Gluconate Cloth  6 each Topical Daily   cloNIDine   0.3 mg Transdermal Weekly   diltiazem   180 mg Oral Once   [START ON 05/18/2024] diltiazem   360 mg Oral Daily   ferrous sulfate   325 mg Oral Daily   fidaxomicin   200 mg Oral BID   losartan   100 mg Oral Daily   mirtazapine   7.5 mg Oral QHS   pantoprazole   40 mg Oral Daily   sodium chloride  flush  10-40 mL Intracatheter Q12H   spironolactone   25 mg Oral Daily    metronidazole  500 mg (05/17/24 1243)   acetaminophen  **OR** acetaminophen , hydrALAZINE , mouth rinse

## 2024-05-17 NOTE — TOC CM/SW Note (Signed)
 Patient active with [facility] prior to admission for HHRN,PT,and OT. Will need resumption of care orders to continue services . Await PT/OT evaluations

## 2024-05-18 DIAGNOSIS — Z7189 Other specified counseling: Secondary | ICD-10-CM | POA: Diagnosis not present

## 2024-05-18 DIAGNOSIS — A0472 Enterocolitis due to Clostridium difficile, not specified as recurrent: Secondary | ICD-10-CM | POA: Diagnosis not present

## 2024-05-18 DIAGNOSIS — Z515 Encounter for palliative care: Secondary | ICD-10-CM

## 2024-05-18 DIAGNOSIS — N186 End stage renal disease: Secondary | ICD-10-CM | POA: Diagnosis not present

## 2024-05-18 DIAGNOSIS — N824 Other female intestinal-genital tract fistulae: Secondary | ICD-10-CM | POA: Diagnosis not present

## 2024-05-18 LAB — RENAL FUNCTION PANEL
Albumin: 1.7 g/dL — ABNORMAL LOW (ref 3.5–5.0)
Anion gap: 13 (ref 5–15)
BUN: 12 mg/dL (ref 8–23)
CO2: 25 mmol/L (ref 22–32)
Calcium: 8 mg/dL — ABNORMAL LOW (ref 8.9–10.3)
Chloride: 98 mmol/L (ref 98–111)
Creatinine, Ser: 2.99 mg/dL — ABNORMAL HIGH (ref 0.44–1.00)
GFR, Estimated: 17 mL/min — ABNORMAL LOW (ref >60)
Glucose, Bld: 116 mg/dL — ABNORMAL HIGH (ref 70–99)
Phosphorus: 2.2 mg/dL — ABNORMAL LOW (ref 2.5–4.6)
Potassium: 3.5 mmol/L (ref 3.5–5.1)
Sodium: 136 mmol/L (ref 135–145)

## 2024-05-18 LAB — CBC WITH DIFFERENTIAL/PLATELET
Basophils Absolute: 0 K/uL (ref 0.0–0.1)
Basophils Relative: 0 %
Eosinophils Absolute: 0.2 K/uL (ref 0.0–0.5)
Eosinophils Relative: 1 %
HCT: 26.1 % — ABNORMAL LOW (ref 36.0–46.0)
Hemoglobin: 8.1 g/dL — ABNORMAL LOW (ref 12.0–15.0)
Lymphocytes Relative: 2 %
Lymphs Abs: 0.4 K/uL — ABNORMAL LOW (ref 0.7–4.0)
MCH: 29.9 pg (ref 26.0–34.0)
MCHC: 31 g/dL (ref 30.0–36.0)
MCV: 96.3 fL (ref 80.0–100.0)
Monocytes Absolute: 0.4 K/uL (ref 0.1–1.0)
Monocytes Relative: 2 %
Neutro Abs: 19.5 K/uL — ABNORMAL HIGH (ref 1.7–7.7)
Neutrophils Relative %: 95 %
Platelets: 258 K/uL (ref 150–400)
RBC: 2.71 MIL/uL — ABNORMAL LOW (ref 3.87–5.11)
RDW: 16.2 % — ABNORMAL HIGH (ref 11.5–15.5)
WBC: 20.5 K/uL — ABNORMAL HIGH (ref 4.0–10.5)
nRBC: 0 % (ref 0.0–0.2)

## 2024-05-18 LAB — GLUCOSE, CAPILLARY
Glucose-Capillary: 101 mg/dL — ABNORMAL HIGH (ref 70–99)
Glucose-Capillary: 81 mg/dL (ref 70–99)
Glucose-Capillary: 87 mg/dL (ref 70–99)
Glucose-Capillary: 89 mg/dL (ref 70–99)
Glucose-Capillary: 96 mg/dL (ref 70–99)

## 2024-05-18 LAB — MAGNESIUM: Magnesium: 1.8 mg/dL (ref 1.7–2.4)

## 2024-05-18 MED ORDER — BOOST / RESOURCE BREEZE PO LIQD CUSTOM
1.0000 | Freq: Three times a day (TID) | ORAL | Status: DC
  Administered 2024-05-18 – 2024-05-19 (×2): 237 mL via ORAL
  Administered 2024-05-19 – 2024-05-21 (×4): 1 via ORAL

## 2024-05-18 MED ORDER — ONDANSETRON HCL 4 MG/2ML IJ SOLN
4.0000 mg | Freq: Four times a day (QID) | INTRAMUSCULAR | Status: AC | PRN
  Administered 2024-05-18: 4 mg via INTRAVENOUS
  Filled 2024-05-18: qty 2

## 2024-05-18 NOTE — Consult Note (Signed)
 Consultation Note Date: 05/18/2024   Patient Name: Tracy Jennings  DOB: 1956/05/31  MRN: [id number]  Age / Sex: 68 y.o., female  PCP: Zachary Idelia LABOR, MD Referring Physician: Kathrin Mignon DASEN, MD  Reason for Consultation: Establishing goals of care  HPI/Patient Profile: 68 y.o. female  with past medical history of cognitive impairment, ESRD on HD MWF, cervical cancer, HTN, HLD, anemia and recent hospitalizations from 6/24-7/22 for hypertensive urgency and delirium, and 8/17-8/24 for C. difficile colitis for which she was treated with p.o. vancomycin   admitted on 05/16/2024 with weakness, nausea, diarrhea and abdominal discomfort. CT abdomen and pelvis showed circumferential wall thickening of rectosigmoid colon suspicious for colitis, Colo uterine fistula, increased bilateral pleural effusions, left > right and area of rounded density in anterior LLL measuring 3.1 cm, and progressive anasarca.  General surgery at [facility] was consulted and recommended higher level of care and the ER physician discussed with Dr. Dasie general surgeon at [facility] who agreed to see patient in consult. Patient was started on Dificid  and Flagyl  for C. difficile colitis.  Evaluated by general surgery - urgent surgical intervention is not indicated. PMT consulted to discuss GOC.   Clinical Assessment and Goals of Care: I have reviewed medical records including EPIC notes, labs and imaging, assessed the patient and then spoke with her sister [name]  to discuss diagnosis prognosis, GOC, EOL wishes, disposition and options.  Evaluated Tracy Jennings - she was not able to participate in goals of care discussion. She could tell me she had abdominal pain but when I asked her why she was in the hospital she was unable to tell me. She did tell me that it was okay to call her family.   No HCPOA listed in chart. During previous evaluation in 2021 patient mentioned her husband as Management consultant -  he is no longer listed in demographics and it is documented patient lives alone. I attempted to first call son - no answer, voicemail with call back number left. Called niece - no answer, voicemail with callback number left. Called sister [name] who answered. I asked her about medical decision makers, sharing that it appears son would be next of kin and decision maker - she tells me the make decisions together as a family.   Ms. Dorner was seen by PMT in 2021 - from review of note seems she did not engage in goals of care conversation much but did confirm a desire for full code/full scope care.   While speaking with [name], I introduced Palliative Medicine as specialized medical care for people living with serious illness. It focuses on providing relief from the symptoms and stress of a serious illness. The goal is to improve quality of life for both the patient and the family.  [name] reviews patient's recent hospitalizations and a stay at [facility] for rehab. Tells me prior to all of that, patient was doing well and living independently - cared for herself well. Has not done well since. She has been in a wheelchair and had some difficulty with PO intake. Lost significant amount of weight. She tells me of more confusion while she was at [facility] - she actually thinks it is better now in the hospital than it was prior.    We discussed patient's current illness and what it means in the larger context of patient's on-going co-morbidities. We discuss her c diff colitis and how this has taken a toll on her body - she is weaker and not eating  well. We discuss her fistula - no urgent need for surgery.   I attempted to elicit values and goals of care important to the patient.  [name] reports patient would be open to any medical care offered to prolong life - this would include CPR and mechanical ventilation. This is consistent with patient's desires charted in 2021.   We discuss potential need for  rehabilitation before returning home - [name] reports the family is not interested in this. They want the patient to return home with home health. The family will provide around the clock care for Tracy Jennings.   Discussed with [name] the importance of continued conversation with family and the medical providers regarding overall plan of care and treatment options, ensuring decisions are within the context of the patient's values and GOCs.    Questions and concerns were addressed. The family was encouraged to call with questions or concerns.    Primary Decision Maker NEXT OF KIN - [name], son - though unable to get in touch with Spoke with sister who reports they typically make decisions for Ms [name] as a family  SUMMARY OF RECOMMENDATIONS   Full code/full scope reported by sister (consistent with patient's prior statements) Family interested in patient discharging home with home health - family to provide around the clock care, had bad experience at rehab facility Discussed pain management with RN - patient drowsy - RN will administer tylenol  and reassess PMT will follow PRN  Code Status/Advance Care Planning: Full code     Primary Diagnoses: Present on Admission:  Dementia with behavioral disturbance (HCC)  Chronic recurrent pancreatitis (HCC)  ESRD (end stage renal disease) (HCC)  Type 2 diabetes mellitus with other specified complication (HCC)  Essential hypertension  Cervical cancer (HCC)  C. difficile colitis  PVD (peripheral vascular disease) (HCC)  Fistula   I have reviewed the medical record, interviewed the patient and family, and examined the patient. The following aspects are pertinent.  Past Medical History:  Diagnosis Date   Anemia 04/2018   low iron. to be started on supplements   Cervical cancer (HCC)    CKD (chronic kidney disease)    Stage IV   Complication of anesthesia    receceived too much anesthesia, that she was in coma for a couple days     COVID-19 virus detected 10/28/2019   Diabetes mellitus without complication (HCC)    type II   ESRD (end stage renal disease) (HCC)    Heart murmur    followed as a child only   HSIL (high grade squamous intraepithelial lesion) on Pap smear of cervix    Hyperlipidemia associated with type 2 diabetes mellitus (HCC)    Hypertension    Pancreatitis    Peripheral vascular disease (HCC)    Social History   Socioeconomic History   Marital status: Married    Spouse name: clarence   Number of children: 2   Years of education: Not on file   Highest education level: Not on file  Occupational History    Comment: works nights  Tobacco Use   Smoking status: Never   Smokeless tobacco: Never  Vaping Use   Vaping status: Never Used  Substance and Sexual Activity   Alcohol use: No   Drug use: Never   Sexual activity: Not Currently  Other Topics Concern   Not on file  Social History Narrative   Not on file   Social Drivers of Health   Financial Resource Strain: High Risk (04/24/2024)  Received from [facility]   Overall Financial Resource Strain (CARDIA)    Difficulty of Paying Living Expenses: Hard  Food Insecurity: No Food Insecurity (05/16/2024)   Hunger Vital Sign    Worried About Running Out of Food in the Last Year: Never true    Ran Out of Food in the Last Year: Never true  Recent Concern: Food Insecurity - Food Insecurity Present (04/24/2024)   Received from [facility]   Hunger Vital Sign    Within the past 12 months, you worried that your food would run out before you got the money to buy more.: Often true    Within the past 12 months, the food you bought just didn't last and you didn't have money to get more.: Sometimes true  Transportation Needs: No Transportation Needs (05/16/2024)   PRAPARE - Administrator, Civil Service (Medical): No    Lack of Transportation (Non-Medical): No  Recent Concern: Transportation Needs - Unmet  Transportation Needs (04/24/2024)   Received from [facility] - Transportation    In the past 12 months, has lack of transportation kept you from medical appointments or from getting medications?: Yes    Lack of Transportation (Non-Medical): No  Physical Activity: Not on file  Stress: Not on file  Social Connections: Unknown (05/16/2024)   Social Connection and Isolation Panel    Frequency of Communication with Friends and Family: Never    Frequency of Social Gatherings with Friends and Family: Once a week    Attends Religious Services: Never    Database administrator or Organizations: No    Attends Engineer, structural: Never    Marital Status: Patient declined   Family History  Problem Relation Age of Onset   Stroke Mother    Hypertension Mother    Gout Mother    Cancer Father    Diabetes Sister    Diabetes Maternal Grandmother    Diabetes Maternal Grandfather    Diabetes Paternal Grandmother    Diabetes Paternal Grandfather    Scheduled Meds:  atorvastatin   40 mg Oral Daily   carvedilol   25 mg Oral BID WC   Chlorhexidine  Gluconate Cloth  6 each Topical Daily   cloNIDine   0.3 mg Transdermal Weekly   diltiazem   360 mg Oral Daily   ferrous sulfate   325 mg Oral Daily   fidaxomicin   200 mg Oral BID   losartan   100 mg Oral Daily   mirtazapine   7.5 mg Oral QHS   pantoprazole   40 mg Oral Daily   sodium chloride  flush  10-40 mL Intracatheter Q12H   spironolactone   25 mg Oral Daily   Continuous Infusions:  metronidazole  500 mg (05/18/24 0032)   PRN Meds:.acetaminophen  **OR** acetaminophen , hydrALAZINE , mouth rinse Allergies  Allergen Reactions   Amlodipine  Swelling    Knees down to ankles Knees down to ankles   Hctz [Hydrochlorothiazide]     pancreatitis   Heparin  Other (See Comments)    Hx  HIT,   Pt reports cardiac arrest when given heparin    Lmw Heparin      Reports cardiac arrest when given heparin    Other Other (See Comments)     Reports cardiac arrest when given during surgery Reports cardiac arrest when given during surgery Reports cardiac arrest when given during surgery    Sulfa Antibiotics Rash   Lactose    Dairy Aid [Tilactase]     Runny nose   Hydralazine   Nausea and rash   Review of Systems  Unable to perform ROS: Dementia    Physical Exam Constitutional:      General: She is not in acute distress.    Appearance: She is ill-appearing.     Comments: somnolent  Pulmonary:     Effort: Pulmonary effort is normal.  Skin:    General: Skin is warm and dry.  Neurological:     Mental Status: She is disoriented.     Vital Signs: BP (!) 145/56 (BP Location: Right Arm)   Pulse 62   Temp 97.7 F (36.5 C) (Oral)   Resp 16   Ht 5' 3 (1.6 m)   Wt 58.6 kg   LMP  (LMP Unknown)   SpO2 92%   BMI 22.88 kg/m  Pain Scale: 0-10   Pain Score: 0-No pain   SpO2: SpO2: 92 % O2 Device:SpO2: 92 % O2 Flow Rate: .   IO: Intake/output summary:  Intake/Output Summary (Last 24 hours) at 05/18/2024 1312 Last data filed at 05/18/2024 0032 Gross per 24 hour  Intake 150 ml  Output 1000 ml  Net -850 ml    LBM: Last BM Date : 05/18/24 Baseline Weight: Weight: 58.6 kg Most recent weight: Weight: 58.6 kg     Palliative Assessment/Data: PPS 40%     *Please note that this is a verbal dictation therefore any spelling or grammatical errors are due to the Dragon Medical One system interpretation.   Time Total: 60 minutes Time spent includes: Detailed review of medical records (labs, imaging, vital signs), medically appropriate exam, discussion with treatment team, counseling and educating patient, family and/or staff, documenting clinical information, medication management and coordination of care.    Tobey Jama Barnacle, DNP, AGNP-C Palliative Medicine Team [phone] Pager: [phone]

## 2024-05-18 NOTE — Evaluation (Signed)
 Occupational Therapy Evaluation Patient Details Name: Tracy Jennings MRN: [id number] DOB: 1956/03/03 Today's Date: 05/18/2024   History of Present Illness   Patient is 68 yo female admitted on 05/16/24 for generalized weakness concerns for colouterine fistula. PMH significant for dementia, HTN, DM, HLD, cervical cancer, recurrent pancreatitis, ESRD on HD.     Clinical Impressions Pt on second attempt needed encouragement with working with therapist and also noted to have a BM. Pt required total assist for peri care and cues to roll side to side. Pt then was fatigued and requesting to rest.  Pt's brother was at bedside and reported having 24/7 support due to the decline in the last 2 months. She needed assist with ADLs, transfers to [facility] and ambulation. He is agreeable to SNF at this time but will update recommendation if close to baseline.      If plan is discharge home, recommend the following:   A lot of help with walking and/or transfers;A lot of help with bathing/dressing/bathroom;Assistance with cooking/housework;Assistance with feeding;Direct supervision/assist for medications management;Direct supervision/assist for financial management;Assist for transportation;Help with stairs or ramp for entrance;Supervision due to cognitive status     Functional Status Assessment   Patient has had a recent decline in their functional status and demonstrates the ability to make significant improvements in function in a reasonable and predictable amount of time.     Equipment Recommendations    (TBD)     Recommendations for Other Services         Precautions/Restrictions   Precautions Precautions: Fall Recall of Precautions/Restrictions: Impaired Restrictions Weight Bearing Restrictions Per Provider Order: No     Mobility Bed Mobility Overal bed mobility: Needs Assistance Bed Mobility: Rolling Rolling: Supervision         General bed mobility comments: pt decline OOB     Transfers                   General transfer comment: pt decline      Balance                                           ADL either performed or assessed with clinical judgement   ADL Overall ADL's : Needs assistance/impaired Eating/Feeding: Set up;Sitting   Grooming: Wash/dry hands;Sitting;Moderate assistance Grooming Details (indicate cue type and reason): pt wanted assist with hygiene only after telling them they placed into bm Upper Body Bathing: Moderate assistance;Sitting   Lower Body Bathing: Moderate assistance;Maximal assistance;Bed level   Upper Body Dressing : Moderate assistance;Bed level   Lower Body Dressing: Maximal assistance;Bed level       Toileting- Clothing Manipulation and Hygiene: Total assistance               Vision         Perception         Praxis         Pertinent Vitals/Pain Pain Assessment Pain Assessment: Faces Faces Pain Scale: Hurts whole lot Breathing: normal Negative Vocalization: none Facial Expression: smiling or inexpressive Body Language: relaxed Consolability: no need to console PAINAD Score: 0 Pain Location: B LE and peri-area Pain Descriptors / Indicators: Tender, Discomfort Pain Intervention(s): Limited activity within patient's tolerance, Monitored during session, Repositioned     Extremity/Trunk Assessment Upper Extremity Assessment Upper Extremity Assessment: Generalized weakness   Lower Extremity Assessment Lower Extremity Assessment: Defer to PT evaluation  Communication Communication Communication: No apparent difficulties   Cognition Arousal: Lethargic Behavior During Therapy: Flat affect Cognition: History of cognitive impairments             OT - Cognition Comments: difficult to assess as did not want to speak to therapist                 Following commands: Impaired Following commands impaired: Follows one step commands with increased  time     Cueing  General Comments   Cueing Techniques: Verbal cues;Gestural cues;Tactile cues;Visual cues  Unable to assess seated balance or transfer quality due to patient's reports of fatigue and lethargy throughout session.   Exercises     Shoulder Instructions      Home Living Family/patient expects to be discharged to:: Private residence Living Arrangements: Other relatives;Other (Comment) (sister provide 24/7 support) Available Help at Discharge: Family;Available 24 hours/day Type of Home: House Home Access: Stairs to enter [employer] of Steps: 5 Entrance Stairs-Rails: Right;Left Home Layout: One level     Bathroom Shower/Tub: Chief Strategy Officer: Standard     Home Equipment: Educational psychologist (4 wheels);Wheelchair - Forensic psychologist (2 wheels);Hospital bed;BSC/3in1   Additional Comments: Patient is poor historian, information provided from chart review and patient's brother.      Prior Functioning/Environment Prior Level of Function : Needs assist       Physical Assist : Mobility (physical);ADLs (physical)     Mobility Comments: Per brother, since 2 months ago, patient requiring assist for all functional mobility and ADLs. Not ambulatory, assist required for toileting when able to and transfers to w/c. Family has been present 24/7 since her discharge home. ADLs Comments: Per chart pt's sister helps w groceries/meds    OT Problem List: Decreased strength;Decreased activity tolerance;Impaired balance (sitting and/or standing);Decreased safety awareness;Decreased knowledge of use of DME or AE;Decreased cognition;Pain   OT Treatment/Interventions: Self-care/ADL training;Therapeutic exercise;Therapeutic activities;Cognitive remediation/compensation;Patient/family education;Balance training      OT Goals(Current goals can be found in the care plan section)   Acute Rehab OT Goals Patient Stated Goal: to get her as strong as  possible OT Goal Formulation: With patient Time For Goal Achievement: 06/01/24 Potential to Achieve Goals: Fair   OT Frequency:  Min 2X/week    Co-evaluation PT/OT/SLP Co-Evaluation/Treatment: Yes Reason for Co-Treatment: To address functional/ADL transfers PT goals addressed during session: Mobility/safety with mobility OT goals addressed during session: ADL's and self-care      AM-PAC OT 6 Clicks Daily Activity     Outcome Measure Help from another person eating meals?: A Little Help from another person taking care of personal grooming?: A Little Help from another person toileting, which includes using toliet, bedpan, or urinal?: Total Help from another person bathing (including washing, rinsing, drying)?: A Lot Help from another person to put on and taking off regular upper body clothing?: A Little   6 Click Score: 12   End of Session Nurse Communication: Mobility status  Activity Tolerance: Patient limited by fatigue Patient left: in bed;with call bell/phone within reach;with bed alarm set;with family/visitor present  OT Visit Diagnosis: Unsteadiness on feet (R26.81);Other abnormalities of gait and mobility (R26.89);Repeated falls (R29.6);Muscle weakness (generalized) (M62.81);Pain Pain - part of body:  (anus)                Time: 9066-8995 OT Time Calculation (min): 31 min Charges:  OT General Charges $OT Visit: 1 Visit OT Evaluation $OT Eval Low Complexity: 1 Low  Elie Leppo K OTR/L  Acute Rehab Services  [phone] office number   Tracy Jennings 05/18/2024, 11:58 AM

## 2024-05-18 NOTE — Evaluation (Signed)
 Physical Therapy Evaluation Patient Details Name: Tracy Jennings MRN: [id number] DOB: [birth year] Today's Date: 05/18/2024  History of Present Illness  Patient is 68 yo female admitted on 05/16/24 for generalized weakness concerns for colouterine fistula. PMH significant for dementia, HTN, DM, HLD, cervical cancer, recurrent pancreatitis, ESRD on HD.  Clinical Impression  Patient met in supine with brother at bedside. PTA, patient was living at home with 24/7 care from family due to functional decline over the past 2 months. Brother reports patient was participating in minimal ambulation and mostly assisted for w/c transfers. Patient presents with generalized weakness B LE, poor activity tolerance, reports of B LE pain, and impaired functional mobility. Currently completes R/L rolling with supervision assist, mod cues for completion. Patient declined participation in EOB activity due to fatigue, minimal eye opening throughout session. Per brother, patient has not been eating or hydrating. Patient will continue to benefit from skilled acute PT to address the above impairments. Recommend post-acute rehab < 3 hours/day at this time until able to demonstrate ability to participate in OOB mobility. May progress home with 24/7 supervision/assist.      If plan is discharge home, recommend the following: A lot of help with walking and/or transfers;A lot of help with bathing/dressing/bathroom;Assist for transportation;Help with stairs or ramp for entrance;Supervision due to cognitive status;Assistance with cooking/housework;Direct supervision/assist for medications management;Direct supervision/assist for financial management   Can travel by private vehicle   No    Equipment Recommendations None recommended by PT;Other (comment) (Patient has necessary equipment at this time.)  Recommendations for Other Services       Functional Status Assessment Patient has had a recent decline in their functional status  and demonstrates the ability to make significant improvements in function in a reasonable and predictable amount of time.     Precautions / Restrictions Precautions Precautions: Fall Recall of Precautions/Restrictions: Impaired Restrictions Weight Bearing Restrictions Per Provider Order: No      Mobility  Bed Mobility Overal bed mobility: Needs Assistance   Rolling: Supervision         General bed mobility comments: supervision for multiple repetitions of R/L rolling. Patient declined participation in EOB activity secondary to fatigue. MaxA x2 for supine scooting toward HOB.    Transfers                   General transfer comment: patient declined    Ambulation/Gait                  Stairs            Wheelchair Mobility     Tilt Bed    Modified Rankin (Stroke Patients Only)       Balance                                             Pertinent Vitals/Pain Pain Assessment Pain Assessment: Faces Faces Pain Scale: Hurts whole lot Pain Location: B LE and peri-area Pain Descriptors / Indicators: Tender, Discomfort Pain Intervention(s): Limited activity within patient's tolerance, Monitored during session, Other (comment) (warm blankets for comfort)    Home Living Family/patient expects to be discharged to:: Private residence Living Arrangements: Other relatives;Other (Comment) (sisters provide 24/7 care) Available Help at Discharge: Family;Available 24 hours/day Type of Home: House Home Access: Stairs to enter Entrance Stairs-Rails: Right;Left Entrance Stairs-Number of Steps: 5   Home Layout:  One level Home Equipment: Educational psychologist (4 wheels);Wheelchair - Forensic psychologist (2 wheels);Hospital bed;BSC/3in1 Additional Comments: Patient is poor historian, information provided from chart review and patient's brother.    Prior Function Prior Level of Function : Needs assist       Physical Assist : Mobility  (physical);ADLs (physical)     Mobility Comments: Per brother, since 2 months ago, patient requiring assist for all functional mobility and ADLs. Not ambulatory, assist required for toileting when able to and transfers to w/c. Family has been present 24/7 since her discharge home.       Extremity/Trunk Assessment   Upper Extremity Assessment Upper Extremity Assessment: Defer to OT evaluation    Lower Extremity Assessment Lower Extremity Assessment: Generalized weakness       Communication   Communication Communication: No apparent difficulties    Cognition Arousal: Lethargic Behavior During Therapy: Flat affect   PT - Cognitive impairments: History of cognitive impairments, Initiation, Awareness, Safety/Judgement   Orientation impairments: Time, Situation, Place                   PT - Cognition Comments: limited eye opening throughout session but following verbal/tactile commands Following commands: Impaired Following commands impaired: Follows one step commands with increased time     Cueing Cueing Techniques: Verbal cues, Gestural cues, Tactile cues, Visual cues     General Comments General comments (skin integrity, edema, etc.): Unable to assess seated balance or transfer quality due to patient's reports of fatigue and lethargy throughout session.    Exercises     Assessment/Plan    PT Assessment Patient needs continued PT services  PT Problem List Decreased strength;Decreased range of motion;Decreased activity tolerance;Decreased balance;Decreased mobility;Decreased coordination;Decreased cognition;Decreased knowledge of use of DME;Decreased safety awareness;Decreased knowledge of precautions       PT Treatment Interventions DME instruction;Gait training;Stair training;Functional mobility training;Therapeutic activities;Therapeutic exercise;Balance training;Neuromuscular re-education;Cognitive remediation;Wheelchair mobility training;Patient/family  education    PT Goals (Current goals can be found in the Care Plan section)  Acute Rehab PT Goals Patient Stated Goal: Patient did not state personal goals. PT Goal Formulation: With patient/family Time For Goal Achievement: 06/01/24 Potential to Achieve Goals: Fair    Frequency Min 2X/week     Co-evaluation PT/OT/SLP Co-Evaluation/Treatment: Yes Reason for Co-Treatment: To address functional/ADL transfers PT goals addressed during session: Mobility/safety with mobility OT goals addressed during session: ADL's and self-care       AM-PAC PT 6 Clicks Mobility  Outcome Measure Help needed turning from your back to your side while in a flat bed without using bedrails?: A Little Help needed moving from lying on your back to sitting on the side of a flat bed without using bedrails?: A Lot Help needed moving to and from a bed to a chair (including a wheelchair)?: Total Help needed standing up from a chair using your arms (e.g., wheelchair or bedside chair)?: Total Help needed to walk in hospital room?: Total Help needed climbing 3-5 steps with a railing? : Total 6 Click Score: 9    End of Session   Activity Tolerance: Patient limited by fatigue Patient left: in bed;with call bell/phone within reach;with bed alarm set;with family/visitor present Nurse Communication: Mobility status PT Visit Diagnosis: Muscle weakness (generalized) (M62.81);Unsteadiness on feet (R26.81)    Time: 9066-8995 PT Time Calculation (min) (ACUTE ONLY): 31 min   Charges:   PT Evaluation $PT Eval Low Complexity: 1 Low PT Treatments $Therapeutic Activity: 8-22 mins PT General Charges $$ ACUTE PT VISIT: 1 Visit  Sherryle De Pere, PT, DPT [facility] Acute Rehabilitation Office: [phone]   Sherryle VEAR [place] 05/18/2024, 10:26 AM

## 2024-05-18 NOTE — Progress Notes (Signed)
 Initial Nutrition Assessment  DOCUMENTATION CODES:   Not applicable  INTERVENTION:   Boost Breeze po TID, each supplement provides 250 kcal and 9 grams of protein  Encourage PO intake   NUTRITION DIAGNOSIS:   Increased nutrient needs related to  (ESRD on HD and new colo-uterine fistula) as evidenced by estimated needs.  GOAL:   Patient will meet greater than or equal to 90% of their needs  MONITOR:   PO intake, Supplement acceptance  REASON FOR ASSESSMENT:   Consult Assessment of nutrition requirement/status  ASSESSMENT:   Pt with PMH of dementia, ESRD on HD MWF, cervical ca, HTN, HLD, anemia, recent hospitalizations from 6/24 - 7/22 for hypertensive urgency and delirium, and 8/17 - 8/24 for Cdiff colitis now admitted from home with abd discomfort, weakness, Nausea, and diarrhea.   CT of abd and pelvis showed rectosigmoid colon suspicious for colitis, colo-uterine fistula, increased bilateral pleural effusions, and progressive anasarca.  Surgery, Dasie, consulted and recommends continued treatment for cdiff and outpt follow up and ok for diet advancement.   Palliative care consulted who spoke with sister. Family would like full care/code status. Sister does note that prior to recent hospitalizations that pt was doing well, living alone. Noted pt with significant weight loss per sister, poor intake, and needing wheel-chair. Pt from home where she receives 24/7 care from family. They would prefer d/c home instead of rehab when time.   Pt met criteria for malnutrition back in 03/2024 Per RD notes pt did not tolerate Nepro during that visit due to diarrhea.    Renal following, pt receives her HD at [facility] with EDW of 53 kg   Medications reviewed and include: ferrous sulfate  325 mg daily, remeron , protonix , aldactone  Flagyl    Labs reviewed:  K 3.5 Phos 2.2 CBG's: 81-101  NUTRITION - FOCUSED PHYSICAL EXAM:  Deferred to follow up  Diet Order:   Diet  Order             DIET DYS 3 Room service appropriate? Yes; Fluid consistency: Thin  Diet effective now                   EDUCATION NEEDS:   Not appropriate for education at this time  Skin:  Skin Assessment: Reviewed RN Assessment  Last BM:  9/4 type 6; medium  Height:   Ht Readings from Last 1 Encounters:  05/16/24 5' 3 (1.6 m)    Weight:   Wt Readings from Last 1 Encounters:  05/17/24 58.6 kg    BMI:  Body mass index is 22.88 kg/m.  Estimated Nutritional Needs:   Kcal:  1700-1900  Protein:  80-90 grams  Fluid:  1L + UOP  Xzayvion Vaeth P., RD, LDN, CNSC See AMiON for contact information

## 2024-05-18 NOTE — Progress Notes (Signed)
 [facility] Progress Note   Subjective:    Seen and examined patient at bedside. A family member is also at bedside. She's lying in bed with minimal engagement. Noted history of dementia. Family member reported she has minimal appetite. Noted max UF was not reached yesterday 2nd hypotension. 1L was removed. Next HD 9/8.  Objective Vitals:   05/17/24 2308 05/17/24 2322 05/17/24 2330 05/18/24 0422  BP: (!) 156/58 (!) 147/50 127/60 (!) 145/56  Pulse: (!) 59 65  62  Resp: 17 (!) 0 16   Temp:  98.2 F (36.8 C) 97.6 F (36.4 C) 97.7 F (36.5 C)  TempSrc:  Oral Oral Oral  SpO2: 100% 98%  92%  Weight:      Height:       Physical Exam General: Awake, chronically ill-appearing; on RA, NAD Heart: S1 and S2; No murmurs, gallops, rubs Lungs: Clear anteriorly Abdomen:Soft and non-tender Extremities: No LE edema Dialysis Access: L AVG   Filed Weights   05/17/24 1938  Weight: 58.6 kg    Intake/Output Summary (Last 24 hours) at 05/18/2024 1312 Last data filed at 05/18/2024 0032 Gross per 24 hour  Intake 150 ml  Output 1000 ml  Net -850 ml    Additional Objective Labs: Basic Metabolic Panel: Recent Labs  Lab 05/16/24 1308 05/17/24 0047 05/18/24 0305  NA 139 142 136  K 3.4* 3.6 3.5  CL 102 104 98  CO2 24 23 25   GLUCOSE 86 80 116*  BUN 24* 23 12  CREATININE 4.34* 4.94* 2.99*  CALCIUM  8.0* 8.6* 8.0*  PHOS  --   --  2.2*   Liver Function Tests: Recent Labs  Lab 05/16/24 1308 05/17/24 0047 05/18/24 0305  AST 12* 13*  --   ALT 7 8  --   ALKPHOS 55 66  --   BILITOT 1.3* 1.1  --   PROT 5.8* 6.0*  --   ALBUMIN 2.0* 1.9* 1.7*   Recent Labs  Lab 05/16/24 1308  LIPASE 22   CBC: Recent Labs  Lab 05/16/24 1308 05/17/24 0047 05/18/24 0305  WBC 21.7* 21.7* 20.5*  NEUTROABS 20.2* 21.3* 19.5*  HGB 8.3* 9.8* 8.1*  HCT 26.6* 31.7* 26.1*  MCV 97.4 97.8 96.3  PLT 252 266 258   Blood Culture    Component Value Date/Time   SDES BLOOD BLOOD RIGHT HAND  05/16/2024 1643   SPECREQUEST  05/16/2024 1643    BOTTLES DRAWN AEROBIC AND ANAEROBIC BOTTLES DRAWN AEROBIC ONLY   CULT  05/16/2024 1643    NO GROWTH 2 DAYS Performed at [facility], [address] [place], [place] [address]    REPTSTATUS PENDING 05/16/2024 1643    Cardiac Enzymes: No results for input(s): CKTOTAL, CKMB, CKMBINDEX, TROPONINI in the last 168 hours. CBG: Recent Labs  Lab 05/17/24 0119 05/17/24 1732 05/18/24 0009 05/18/24 0801 05/18/24 1215  GLUCAP 81 79 81 96 101*   Iron Studies: No results for input(s): IRON, TIBC, TRANSFERRIN, FERRITIN in the last 72 hours. Lab Results  Component Value Date   INR 1.3 (H) 03/05/2024   INR 1.1 11/03/2019   INR 1.04 05/12/2018   Studies/Results: CT ABDOMEN PELVIS WO CONTRAST Result Date: 05/16/2024 CLINICAL DATA:  Weakness.  C diff. EXAM: CT ABDOMEN AND PELVIS WITHOUT CONTRAST TECHNIQUE: Multidetector CT imaging of the abdomen and pelvis was performed following the standard protocol without IV contrast. RADIATION DOSE REDUCTION: This exam was performed according to the departmental dose-optimization program which includes automated exposure control, adjustment of the  mA and/or kV according to patient size and/or use of iterative reconstruction technique. COMPARISON:  Radiograph earlier today.  CT 04/28/2024 FINDINGS: Lower chest: Increased bilateral pleural effusions, left greater than right. An area of rounded density within the anterior left lower lobe measuring 3.1 cm, series 4, image 8, is more rounded mid left amorphous than on prior exam. Hepatobiliary: Unremarkable unenhanced appearance of the liver. Post cholecystectomy with stable biliary tree. Pancreas: No ductal dilatation or inflammation. Spleen: Normal in size without focal abnormality. Adrenals/Urinary Tract: Stable adrenal thickening. Chronic bilateral renal parenchymal atrophy. No hydronephrosis or renal stone. Minimally distended urinary  bladder, diffusely thick walled. Stomach/Bowel: Persistent circumferential wall thickening of the rectosigmoid which is decompressed. Immediately adjacent is an area of colonic distention with stool, however also demonstrates wall thickening. There is a moderate to large volume of formed stool throughout the remainder of the colon, without additional sites of colonic inflammation. No small bowel obstruction or small bowel inflammation. Normal appendix tentatively visualized. Stomach is nondistended Vascular/Lymphatic: Advanced aortic and branch atherosclerosis. No abdominopelvic adenopathy. Reproductive: There is a large amount of air within the endometrial canal, series 2, image 69 and series 6, image 58. 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 Large uterine soft tissue defect with stool in the region of suspected fistula, series 2 image 69 and series 6, image 59. Marked uterine calcifications. Other: Generalized body wall edema, progressive from prior exam. Small volume abdominopelvic ascites. No free intra-abdominal air. Musculoskeletal: The bones are subjectively under mineralized. Stable appearance of L4-L5 with invagination of inferior L4 vertebral body impacted on L5. Stable anterolisthesis at the L4-L5 level with spinal canal narrowing. No new osseous abnormalities. IMPRESSION: 1. Circumferential wall thickening of the rectosigmoid colon suspicious for colitis. 2. Findings consistent with colo-uterine fistula, large defect in the posterior uterus which contains stool, and air in the endometrial canal. Recommend surgical consultation. 3. Increased bilateral pleural effusions, left greater than right. An area of rounded density within the anterior left lower lobe measuring 3.1 cm is more rounded than on prior exam. This may represent rounded atelectasis, however underlying mass is not  excluded. Recommend follow-up chest CT after course of treatment. 4. Generalized body wall edema, progressive from prior exam. Small volume abdominopelvic ascites. Aortic Atherosclerosis (ICD10-I70.0). Electronically Signed   By: Andrea Gasman M.D.   On: 05/16/2024 16:49   DG Abdomen 1 View Result Date: 05/16/2024 CLINICAL DATA:  Toxic megacolon. EXAM: ABDOMEN - 1 VIEW COMPARISON:  March 21, 2024.  April 28, 2024. FINDINGS: Status post cholecystectomy. No abnormal bowel dilatation is noted. Large amount of stool seen throughout the colon and rectum. IMPRESSION: Large stool burden. No abnormal bowel dilatation. Electronically Signed   By: Lynwood Landy Raddle M.D.   On: 05/16/2024 16:23   DG Chest Portable 1 View Result Date: 05/16/2024 CLINICAL DATA:  Weakness. EXAM: PORTABLE CHEST 1 VIEW COMPARISON:  04/01/2024. FINDINGS: Stable cardiomediastinal contours. Stable right chest Port-A-Cath. Moderate left pleural effusion with left basilar opacities favored to reflect atelectasis, increased since the prior exam. Low lung volumes with bronchovascular crowding. No pneumothorax. No acute osseous abnormality. IMPRESSION: Moderate left pleural effusion with left basilar opacities favored to reflect atelectasis, increased since the prior exam. Electronically Signed   By: Harrietta Sherry M.D.   On: 05/16/2024 13:44    Medications:  metronidazole  500 mg (05/18/24 0032)    atorvastatin   40 mg Oral Daily  carvedilol   25 mg Oral BID WC   Chlorhexidine  Gluconate Cloth  6 each Topical Daily   cloNIDine   0.3 mg Transdermal Weekly   diltiazem   360 mg Oral Daily   ferrous sulfate   325 mg Oral Daily   fidaxomicin   200 mg Oral BID   losartan   100 mg Oral Daily   mirtazapine   7.5 mg Oral QHS   pantoprazole   40 mg Oral Daily   sodium chloride  flush  10-40 mL Intracatheter Q12H   spironolactone   25 mg Oral Daily    Dialysis Orders: MWF [facility]  3.5hrs BFR 400 DFR 500 EDW 53kg 2K/2.5Ca Heparin   bolus 600 units with HD then 400 units/hr Calcitriol  1.54mcg with HD - last dose 9/3 Sensipar 60mg  with HD - last dose 9/3 Micera 75mcg Q 4 weeks - per records, dose just ordered Calcium  Acetate 667mh TID with meals  Assessment/Plan: Colo-uterine fistula: per abd CT. Per surgery/ pmd Cdif colitis: per pmd ESRD: on HD MWF. Received HD yesterday. Next HD 9/8. HTN: on multiple BP lowering meds at home and here. BP's stable.  Volume: euvolemic on exam, no edema, CXR no edema. Didn't reach max UF yesterday 2nd hypotension. Lower UFG at next HD. 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 Holding binders, VDRA, and Sensipar for now.   Charmaine Piety, NP [facility] 05/18/2024,1:12 PM  LOS: 2 days

## 2024-05-18 NOTE — Progress Notes (Signed)
 PROGRESS NOTE  Tracy Jennings FMW:[id number] DOB: 02-23-1956   PCP: George, Sionne A, MD  Patient is from: Home.  Lives alone.  Uses rolling walker at baseline.  Was able to take care of her ADLs including cooking until her recent hospitalization at [facility] in July.   DOA: 05/16/2024 LOS: 2  Chief complaints No chief complaint on file.    Brief Narrative / Interim history: 68 year old F with PMH of cognitive impairment, ESRD on HD MWF, cervical cancer, HTN, HLD, anemia and recent hospitalizations from 6/24-7/22 for hypertensive urgency and delirium, and 8/17-8/24 for C. difficile colitis for which she was treated with p.o. vancomycin  brought back to [facility] from PCP office due to weakness, nausea, diarrhea and abdominal discomfort.  In ED, WBC 21.7.  Hgb 8.3.  Troponin 37.  CT abdomen and pelvis showed circumferential wall thickening of rectosigmoid colon suspicious for colitis, Colo uterine fistula, increased bilateral pleural effusions, left > right and area of rounded density in anterior LLL measuring 3.1 cm, and progressive anasarca.  General surgery at [facility] was consulted and recommended higher level of care and the ER physician discussed with Dr. Dasie general surgeon at [facility] who agreed to see patient in consult.   Patient was started on Dificid  and Flagyl  for C. difficile colitis.  Evaluated by general surgery, and they recommended elective management, and treating for C. difficile..    Subjective: Seen and examined earlier this morning.  No major events overnight or this morning.  Patient's brother at bedside.  She has 1 loose bowel movement this morning.  Nonbloody.  Reports pain in perineal area.  Therapy cleaning her up.   Objective: Vitals:   05/17/24 2308 05/17/24 2322 05/17/24 2330 05/18/24 0422  BP: (!) 156/58 (!) 147/50 127/60 (!) 145/56  Pulse: (!) 59 65  62  Resp: 17 (!) 0 16   Temp:  98.2 F (36.8 C) 97.6 F (36.4 C) 97.7 F (36.5 C)  TempSrc:  Oral Oral Oral   SpO2: 100% 98%  92%  Weight:      Height:        Examination:  GENERAL: Appears frail.  No apparent distress. HEENT: MMM.  Vision and hearing grossly intact.  NECK: Supple.  No apparent JVD.  RESP:  No IWOB.  Fair aeration bilaterally. CVS:  RRR. Heart sounds normal.  ABD/GI/GU: BS+. Abd soft, NTND.  MSK/EXT:  Moves extremities.  Significant muscle mass and subcu fat loss.  Some swelling in left forearm.  F ROM.  No erythema.  No tenderness. SKIN: no apparent skin lesion or wound NEURO: Sleepy but wakes to voice.  Oriented to self and family.   Follows commands.  No apparent focal neuro deficit. PSYCH: Calm. Normal affect.   Consultants:  General Surgery Infectious disease   Procedures: None  Microbiology summarized: None  Assessment and plan: C. difficile colitis: Completed 10 days of oral vancomycin  about a week ago.  Ongoing abdominal pain and diarrhea.  CT suggested rectosigmoid colitis.  Significant elevated leukocytosis.  She has loose stool but not frequently.  Abdominal exam benign. - Continue Dificid  and IV Flagyl  - Advance to soft diet.  Colouterine fistula-reason for transfer from [name] to [facility]: Lambert Rhonda W.  Abdominal exam benign. - General Surgery recommends elective management and treating C. difficile. - Advance to soft diet.  ESRD on HD MWF-last HD on Wednesday.  No emergent need. - Per nephrology.  Uncontrolled hypertension: On clonidine  patch, Cardizem , losartan , hydralazine , Aldactone  and Coreg .  BP improved. -Continue home clonidine   patch, losartan , Coreg , hydralazine  and Aldactone  -Increased Cardizem  from 180 mg daily to 360 mg daily - Continue monitoring  Dementia without behavioral disturbance: Oriented to self and family. - Reorientation and delirium precaution  Failure to thrive/poor appetite/unintentional weight loss: family reports significant weight loss since her hospitalization in July 2025.  Not able to quantify.  She has had very poor appetite. -  Started low-dose Remeron  -Appreciate input by dietitian -Treat reversible causes. - Appreciate input by palliative  Anemia of renal disease: H&H relatively stable.  No overt bleeding such as melena or hematochezia. Recent Labs    04/28/24 1505 04/28/24 2302 04/29/24 0511 04/30/24 0429 05/02/24 0922 05/03/24 0801 05/04/24 0338 05/16/24 1308 05/17/24 0047 05/18/24 0305  HGB 7.8* 8.0* 7.6* 7.5* 10.1* 9.2* 9.9* 8.3* 9.8* 8.1*  - Continue monitoring  History of uterine cancer: - Outpatient follow-up.  Anterior LLL mass/density: CT showed anterior LLL rounded density measuring 3.1 cm - Needs follow-up CT in 4 to 6 weeks  Leukocytosis/bandemia: Elevated but stable. - Antibiotics as above - Continue monitoring  Physical deconditioning - PT/OT.  SNF recommended but family prefers to take her home with [facility] due to bad experience in the past  Hypokalemia -Monitor replenish K and Mg as appropriate  Body mass index is 22.88 kg/m.           DVT prophylaxis:  Place and maintain sequential compression device Start: 05/17/24 1416 SCDs Start: 05/17/24 0000  Code Status: Full code Family Communication: Updated patient's brother at bedside. Level of care: Telemetry Medical Status is: Inpatient Remains inpatient appropriate because: C. difficile colitis   Final disposition: Likely home with home health in the next 24 to 48 hours   55 minutes with more than 50% spent in reviewing records, counseling patient/family and coordinating care.   Sch Meds:  Scheduled Meds:  atorvastatin   40 mg Oral Daily   carvedilol   25 mg Oral BID WC   Chlorhexidine  Gluconate Cloth  6 each Topical Daily   cloNIDine   0.3 mg Transdermal Weekly   diltiazem   360 mg Oral Daily   ferrous sulfate   325 mg Oral Daily   fidaxomicin   200 mg Oral BID   losartan   100 mg Oral Daily   mirtazapine   7.5 mg Oral QHS   pantoprazole   40 mg Oral Daily   sodium chloride  flush  10-40 mL Intracatheter Q12H    spironolactone   25 mg Oral Daily   Continuous Infusions:  metronidazole  500 mg (05/18/24 1346)   PRN Meds:.acetaminophen  **OR** acetaminophen , hydrALAZINE , mouth rinse  Antimicrobials: Anti-infectives (From admission, onward)    Start     Dose/Rate Route Frequency Ordered Stop   05/17/24 0100  fidaxomicin  (DIFICID ) tablet 200 mg        200 mg Oral 2 times daily 05/17/24 0003 05/26/24 2159   05/17/24 0100  metroNIDAZOLE  (FLAGYL ) IVPB 500 mg        500 mg 100 mL/hr over 60 Minutes Intravenous Every 12 hours 05/17/24 0003          I have personally reviewed the following labs and images: CBC: Recent Labs  Lab 05/16/24 1308 05/17/24 0047 05/18/24 0305  WBC 21.7* 21.7* 20.5*  NEUTROABS 20.2* 21.3* 19.5*  HGB 8.3* 9.8* 8.1*  HCT 26.6* 31.7* 26.1*  MCV 97.4 97.8 96.3  PLT 252 266 258   BMP &GFR Recent Labs  Lab 05/16/24 1308 05/17/24 0047 05/18/24 0305  NA 139 142 136  K 3.4* 3.6 3.5  CL 102 104 98  CO2 24 23 25   GLUCOSE 86 80 116*  BUN 24* 23 12  CREATININE 4.34* 4.94* 2.99*  CALCIUM  8.0* 8.6* 8.0*  MG  --   --  1.8  PHOS  --   --  2.2*   Estimated Creatinine Clearance: 15.3 mL/min (A) (by C-G formula based on SCr of 2.99 mg/dL (H)). Liver & Pancreas: Recent Labs  Lab 05/16/24 1308 05/17/24 0047 05/18/24 0305  AST 12* 13*  --   ALT 7 8  --   ALKPHOS 55 66  --   BILITOT 1.3* 1.1  --   PROT 5.8* 6.0*  --   ALBUMIN 2.0* 1.9* 1.7*   Recent Labs  Lab 05/16/24 1308  LIPASE 22   No results for input(s): AMMONIA in the last 168 hours. Diabetic: No results for input(s): HGBA1C in the last 72 hours. Recent Labs  Lab 05/17/24 0119 05/17/24 1732 05/18/24 0009 05/18/24 0801 05/18/24 1215  GLUCAP 81 79 81 96 101*   Cardiac Enzymes: No results for input(s): CKTOTAL, CKMB, CKMBINDEX, TROPONINI in the last 168 hours. No results for input(s): PROBNP in the last 8760 hours. Coagulation Profile: No results for input(s): INR, PROTIME in  the last 168 hours. Thyroid Function Tests: No results for input(s): TSH, T4TOTAL, FREET4, T3FREE, THYROIDAB in the last 72 hours. Lipid Profile: No results for input(s): CHOL, HDL, LDLCALC, TRIG, CHOLHDL, LDLDIRECT in the last 72 hours. Anemia Panel: No results for input(s): VITAMINB12, FOLATE, FERRITIN, TIBC, IRON, RETICCTPCT in the last 72 hours. Urine analysis:    Component Value Date/Time   COLORURINE YELLOW (A) 10/31/2017 1959   APPEARANCEUR HAZY (A) 10/31/2017 1959   APPEARANCEUR Hazy 12/09/2013 1046   LABSPEC 1.017 10/31/2017 1959   LABSPEC 1.031 12/09/2013 1046   PHURINE 6.0 10/31/2017 1959   GLUCOSEU >=500 (A) 10/31/2017 1959   GLUCOSEU >=500 12/09/2013 1046   HGBUR NEGATIVE 10/31/2017 1959   BILIRUBINUR NEGATIVE 10/31/2017 1959   BILIRUBINUR Negative 12/09/2013 1046   KETONESUR NEGATIVE 10/31/2017 1959   PROTEINUR >=300 (A) 10/31/2017 1959   NITRITE NEGATIVE 10/31/2017 1959   LEUKOCYTESUR NEGATIVE 10/31/2017 1959   LEUKOCYTESUR Negative 12/09/2013 1046   Sepsis Labs: Invalid input(s): PROCALCITONIN, LACTICIDVEN  Microbiology: Recent Results (from the past 240 hours)  Blood culture (routine x 2)     Status: None (Preliminary result)   Collection Time: 05/16/24  4:43 PM   Specimen: BLOOD  Result Value Ref Range Status   Specimen Description BLOOD BLOOD RIGHT HAND  Final   Special Requests   Final    BOTTLES DRAWN AEROBIC AND ANAEROBIC BOTTLES DRAWN AEROBIC ONLY   Culture   Final    NO GROWTH 2 DAYS Performed at [facility], [address], [place], [place] [address]    Report Status PENDING  Incomplete    Radiology Studies: No results found.     Kenetha Cozza T. Fumio Vandam [facility]  If 7PM-7AM, please contact night-coverage [email or web address] 05/18/2024, 4:21 PM

## 2024-05-18 NOTE — Plan of Care (Signed)
   Problem: Nutrition: Goal: Adequate nutrition will be maintained Outcome: Progressing   Problem: Pain Managment: Goal: General experience of comfort will improve and/or be controlled Outcome: Progressing   Problem: Safety: Goal: Ability to remain free from injury will improve Outcome: Progressing

## 2024-05-19 ENCOUNTER — Inpatient Hospital Stay (HOSPITAL_COMMUNITY)

## 2024-05-19 DIAGNOSIS — M7989 Other specified soft tissue disorders: Secondary | ICD-10-CM | POA: Diagnosis not present

## 2024-05-19 DIAGNOSIS — F03918 Unspecified dementia, unspecified severity, with other behavioral disturbance: Secondary | ICD-10-CM | POA: Diagnosis not present

## 2024-05-19 DIAGNOSIS — A0472 Enterocolitis due to Clostridium difficile, not specified as recurrent: Secondary | ICD-10-CM | POA: Diagnosis not present

## 2024-05-19 DIAGNOSIS — N824 Other female intestinal-genital tract fistulae: Secondary | ICD-10-CM | POA: Diagnosis not present

## 2024-05-19 DIAGNOSIS — C539 Malignant neoplasm of cervix uteri, unspecified: Secondary | ICD-10-CM | POA: Diagnosis not present

## 2024-05-19 LAB — CBC WITH DIFFERENTIAL/PLATELET
Basophils Absolute: 0 K/uL (ref 0.0–0.1)
Basophils Relative: 0 %
Eosinophils Absolute: 0 K/uL (ref 0.0–0.5)
Eosinophils Relative: 0 %
HCT: 27.7 % — ABNORMAL LOW (ref 36.0–46.0)
Hemoglobin: 8.6 g/dL — ABNORMAL LOW (ref 12.0–15.0)
Lymphocytes Relative: 2 %
Lymphs Abs: 0.4 K/uL — ABNORMAL LOW (ref 0.7–4.0)
MCH: 29.9 pg (ref 26.0–34.0)
MCHC: 31 g/dL (ref 30.0–36.0)
MCV: 96.2 fL (ref 80.0–100.0)
Monocytes Absolute: 0.2 K/uL (ref 0.1–1.0)
Monocytes Relative: 1 %
Neutro Abs: 20.8 K/uL — ABNORMAL HIGH (ref 1.7–7.7)
Neutrophils Relative %: 97 %
Platelets: 263 K/uL (ref 150–400)
RBC: 2.88 MIL/uL — ABNORMAL LOW (ref 3.87–5.11)
RDW: 16.1 % — ABNORMAL HIGH (ref 11.5–15.5)
WBC: 21.4 K/uL — ABNORMAL HIGH (ref 4.0–10.5)
nRBC: 0 % (ref 0.0–0.2)

## 2024-05-19 LAB — RENAL FUNCTION PANEL
Albumin: 1.6 g/dL — ABNORMAL LOW (ref 3.5–5.0)
Anion gap: 13 (ref 5–15)
BUN: 22 mg/dL (ref 8–23)
CO2: 25 mmol/L (ref 22–32)
Calcium: 8.1 mg/dL — ABNORMAL LOW (ref 8.9–10.3)
Chloride: 98 mmol/L (ref 98–111)
Creatinine, Ser: 3.87 mg/dL — ABNORMAL HIGH (ref 0.44–1.00)
GFR, Estimated: 12 mL/min — ABNORMAL LOW (ref >60)
Glucose, Bld: 147 mg/dL — ABNORMAL HIGH (ref 70–99)
Phosphorus: 3.1 mg/dL (ref 2.5–4.6)
Potassium: 3.3 mmol/L — ABNORMAL LOW (ref 3.5–5.1)
Sodium: 136 mmol/L (ref 135–145)

## 2024-05-19 LAB — HEPATITIS B SURFACE ANTIGEN: Hepatitis B Surface Ag: NONREACTIVE

## 2024-05-19 LAB — GLUCOSE, CAPILLARY
Glucose-Capillary: 122 mg/dL — ABNORMAL HIGH (ref 70–99)
Glucose-Capillary: 125 mg/dL — ABNORMAL HIGH (ref 70–99)
Glucose-Capillary: 114 mg/dL — ABNORMAL HIGH (ref 70–99)
Glucose-Capillary: 101 mg/dL — ABNORMAL HIGH (ref 70–99)

## 2024-05-19 LAB — MAGNESIUM: Magnesium: 2 mg/dL (ref 1.7–2.4)

## 2024-05-19 MED ORDER — CHLORHEXIDINE GLUCONATE CLOTH 2 % EX PADS
6.0000 | MEDICATED_PAD | Freq: Every day | CUTANEOUS | Status: DC
  Administered 2024-05-20 – 2024-06-16 (×28): 6 via TOPICAL

## 2024-05-19 MED ORDER — POTASSIUM CHLORIDE CRYS ER 20 MEQ PO TBCR
40.0000 meq | EXTENDED_RELEASE_TABLET | Freq: Once | ORAL | Status: AC
  Administered 2024-05-19: 40 meq via ORAL
  Filled 2024-05-19: qty 2

## 2024-05-19 MED ORDER — APIXABAN 5 MG PO TABS
5.0000 mg | ORAL_TABLET | Freq: Two times a day (BID) | ORAL | Status: DC
  Administered 2024-05-26 – 2024-06-01 (×13): 5 mg via ORAL
  Filled 2024-05-19 (×13): qty 1

## 2024-05-19 MED ORDER — APIXABAN 5 MG PO TABS
10.0000 mg | ORAL_TABLET | Freq: Two times a day (BID) | ORAL | Status: AC
  Administered 2024-05-19 – 2024-05-26 (×14): 10 mg via ORAL
  Filled 2024-05-19 (×14): qty 2

## 2024-05-19 NOTE — Progress Notes (Signed)
 PROGRESS NOTE  Tracy Jennings FMW:[id number] DOB: September 07, 1956   PCP: George, Sionne A, MD  Patient is from: Home.  Lives alone.  Uses rolling walker at baseline.  Was able to take care of her ADLs including cooking until her recent hospitalization at [facility] in July.   DOA: 05/16/2024 LOS: 3  Chief complaints No chief complaint on file.    Brief Narrative / Interim history: 68 year old F with PMH of cognitive impairment, ESRD on HD MWF, cervical cancer, HTN, HLD, anemia and recent hospitalizations from 6/24-7/22 for hypertensive urgency and delirium, and 8/17-8/24 for C. difficile colitis for which she was treated with p.o. vancomycin  brought back to [facility] from PCP office due to weakness, nausea, diarrhea and abdominal discomfort.  In ED, WBC 21.7.  Hgb 8.3.  Troponin 37.  CT abdomen and pelvis showed circumferential wall thickening of rectosigmoid colon suspicious for colitis, Colo uterine fistula, increased bilateral pleural effusions, left > right and area of rounded density in anterior LLL measuring 3.1 cm, and progressive anasarca.  General surgery at [facility] was consulted and recommended higher level of care and the ER physician discussed with Dr. Dasie general surgeon at [facility] who agreed to see patient in consult.   Patient was started on Dificid  and Flagyl  for C. difficile colitis.  Evaluated by general surgery, and they recommended elective management, and treating for C. difficile..   Persistent leukocytosis.    Subjective: Seen and examined earlier this morning.  No major events overnight or this morning.  No complaints.  Patient's brother and sister at bedside.  Objective: Vitals:   05/17/24 2330 05/18/24 0422 05/18/24 2037 05/19/24 0345  BP: 127/60 (!) 145/56 (!) 153/49 (!) 159/61  Pulse:  62 68 70  Resp: 16  16   Temp: 97.6 F (36.4 C) 97.7 F (36.5 C) 98.2 F (36.8 C) 98 F (36.7 C)  TempSrc: Oral Oral Oral Oral  SpO2:  92% 97% 96%  Weight:      Height:         Examination:  GENERAL: Appears frail.  No apparent distress. HEENT: MMM.  Vision and hearing grossly intact.  NECK: Supple.  No apparent JVD.  RESP:  No IWOB.  Fair aeration bilaterally. CVS:  RRR. Heart sounds normal.  ABD/GI/GU: BS+. Abd soft, NTND.  MSK/EXT:  Moves extremities.  Significant muscle mass and subcu fat loss.  Left forearm swelling improved. SKIN: no apparent skin lesion or wound NEURO: Sleepy but wakes to voice.  Oriented to self and family.   Follows commands.  No apparent focal neuro deficit. PSYCH: Calm. Normal affect.   Consultants:  General Surgery Infectious disease   Procedures: None  Microbiology summarized: 9/4-single set blood culture at [facility] NGTD.  Assessment and plan: C. difficile colitis: Completed 10 days of oral vancomycin  about a week ago.  Ongoing abdominal pain and diarrhea.  CT suggested rectosigmoid colitis.  She has loose stool but not frequently.  Abdominal exam benign.  Still with significant leukocytosis - Continue Dificid  and IV Flagyl  - Continue soft diet.  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  Abdominal exam benign. - General Surgery recommends elective management and treating C. difficile. - Continue soft diet.  Leukocytosis/bandemia: Persisted despite treatment of C. difficile.  Has no respiratory symptoms but chronic left pleural effusion.  Left forearm swelling but no signs of infection.  Her blood cultures negative for 9/4.  Colouterine fistula could also increase her risk of gynecologic infection. - IR thoracocentesis with fluid analysis -  ID consult if no improvement  ESRD on HD MWF-last HD on Wednesday.  No emergent need. - Per nephrology.  Uncontrolled hypertension: Improved but still elevated. -Continue home clonidine  patch, losartan , Coreg , hydralazine  and Aldactone  -Increased Cardizem  from 180 mg daily to 360 mg daily on 9/5 -Continue monitoring  Dementia without behavioral disturbance:  Oriented to self and family. - Reorientation and delirium precaution  Layering left pleural effusion: Seems chronic. - Thoracocentesis with fluid analysis.  Failure to thrive/poor appetite/unintentional weight loss: family reports significant weight loss since her hospitalization in July 2025.  Not able to quantify.  She has had very poor appetite. -Started low-dose Remeron  -Appreciate input by dietitian -Treat reversible causes. -Appreciate input by palliative  Anemia of renal disease: H&H relatively stable.  No overt bleeding such as melena or hematochezia. Recent Labs    04/28/24 2302 04/29/24 0511 04/30/24 0429 05/02/24 0922 05/03/24 0801 05/04/24 0338 05/16/24 1308 05/17/24 0047 05/18/24 0305 05/19/24 0440  HGB 8.0* 7.6* 7.5* 10.1* 9.2* 9.9* 8.3* 9.8* 8.1* 8.6*  - Continue monitoring  History of uterine cancer: - Outpatient follow-up.  Anterior LLL mass/density: CT showed anterior LLL rounded density measuring 3.1 cm - Needs follow-up CT in 4 to 6 weeks  Physical deconditioning - PT/OT.  SNF recommended but family prefers to take her home with [facility] due to bad experience in the past  Hypokalemia -Monitor replenish K and Mg as appropriate   Increased nutrient needs Body mass index is 22.88 kg/m. Nutrition Problem: Increased nutrient needs Etiology:  (ESRD on HD and new colo-uterine fistula) Signs/Symptoms: estimated needs Interventions: Boost Breeze   DVT prophylaxis:  Place and maintain sequential compression device Start: 05/17/24 1416 SCDs Start: 05/17/24 0000  Code Status: Full code Family Communication: Updated patient's brother and sister at bedside. Level of care: Telemetry Medical Status is: Inpatient Remains inpatient appropriate because: C. difficile colitis, Colouterine fistula and persistent leukocytosis   Final disposition: Likely home with home health in the next 24 to 48 hours  55 minutes with more than 50% spent in reviewing records,  counseling patient/family and coordinating care.   Sch Meds:  Scheduled Meds:  atorvastatin   40 mg Oral Daily   carvedilol   25 mg Oral BID WC   Chlorhexidine  Gluconate Cloth  6 each Topical Daily   cloNIDine   0.3 mg Transdermal Weekly   diltiazem   360 mg Oral Daily   feeding supplement  1 Container Oral TID BM   ferrous sulfate   325 mg Oral Daily   fidaxomicin   200 mg Oral BID   losartan   100 mg Oral Daily   mirtazapine   7.5 mg Oral QHS   pantoprazole   40 mg Oral Daily   sodium chloride  flush  10-40 mL Intracatheter Q12H   spironolactone   25 mg Oral Daily   Continuous Infusions:  metronidazole  500 mg (05/19/24 1336)   PRN Meds:.acetaminophen  **OR** acetaminophen , hydrALAZINE , mouth rinse  Antimicrobials: Anti-infectives (From admission, onward)    Start     Dose/Rate Route Frequency Ordered Stop   05/17/24 0100  fidaxomicin  (DIFICID ) tablet 200 mg        200 mg Oral 2 times daily 05/17/24 0003 05/26/24 2159   05/17/24 0100  metroNIDAZOLE  (FLAGYL ) IVPB 500 mg        500 mg 100 mL/hr over 60 Minutes Intravenous Every 12 hours 05/17/24 0003          I have personally reviewed the following labs and images: CBC: Recent Labs  Lab 05/16/24 1308 05/17/24 0047 05/18/24  0305 05/19/24 0440  WBC 21.7* 21.7* 20.5* 21.4*  NEUTROABS 20.2* 21.3* 19.5* 20.8*  HGB 8.3* 9.8* 8.1* 8.6*  HCT 26.6* 31.7* 26.1* 27.7*  MCV 97.4 97.8 96.3 96.2  PLT 252 266 258 263   BMP &GFR Recent Labs  Lab 05/16/24 1308 05/17/24 0047 05/18/24 0305 05/19/24 0440  NA 139 142 136 136  K 3.4* 3.6 3.5 3.3*  CL 102 104 98 98  CO2 24 23 25 25   GLUCOSE 86 80 116* 147*  BUN 24* 23 12 22   CREATININE 4.34* 4.94* 2.99* 3.87*  CALCIUM  8.0* 8.6* 8.0* 8.1*  MG  --   --  1.8 2.0  PHOS  --   --  2.2* 3.1   Estimated Creatinine Clearance: 11.8 mL/min (A) (by C-G formula based on SCr of 3.87 mg/dL (H)). Liver & Pancreas: Recent Labs  Lab 05/16/24 1308 05/17/24 0047 05/18/24 0305 05/19/24 0440   AST 12* 13*  --   --   ALT 7 8  --   --   ALKPHOS 55 66  --   --   BILITOT 1.3* 1.1  --   --   PROT 5.8* 6.0*  --   --   ALBUMIN 2.0* 1.9* 1.7* 1.6*   Recent Labs  Lab 05/16/24 1308  LIPASE 22   No results for input(s): AMMONIA in the last 168 hours. Diabetic: No results for input(s): HGBA1C in the last 72 hours. Recent Labs  Lab 05/18/24 1215 05/18/24 1656 05/18/24 2142 05/19/24 0809 05/19/24 1210  GLUCAP 101* 89 87 122* 125*   Cardiac Enzymes: No results for input(s): CKTOTAL, CKMB, CKMBINDEX, TROPONINI in the last 168 hours. No results for input(s): PROBNP in the last 8760 hours. Coagulation Profile: No results for input(s): INR, PROTIME in the last 168 hours. Thyroid Function Tests: No results for input(s): TSH, T4TOTAL, FREET4, T3FREE, THYROIDAB in the last 72 hours. Lipid Profile: No results for input(s): CHOL, HDL, LDLCALC, TRIG, CHOLHDL, LDLDIRECT in the last 72 hours. Anemia Panel: No results for input(s): VITAMINB12, FOLATE, FERRITIN, TIBC, IRON, RETICCTPCT in the last 72 hours. Urine analysis:    Component Value Date/Time   COLORURINE YELLOW (A) 10/31/2017 1959   APPEARANCEUR HAZY (A) 10/31/2017 1959   APPEARANCEUR Hazy 12/09/2013 1046   LABSPEC 1.017 10/31/2017 1959   LABSPEC 1.031 12/09/2013 1046   PHURINE 6.0 10/31/2017 1959   GLUCOSEU >=500 (A) 10/31/2017 1959   GLUCOSEU >=500 12/09/2013 1046   HGBUR NEGATIVE 10/31/2017 1959   BILIRUBINUR NEGATIVE 10/31/2017 1959   BILIRUBINUR Negative 12/09/2013 1046   KETONESUR NEGATIVE 10/31/2017 1959   PROTEINUR >=300 (A) 10/31/2017 1959   NITRITE NEGATIVE 10/31/2017 1959   LEUKOCYTESUR NEGATIVE 10/31/2017 1959   LEUKOCYTESUR Negative 12/09/2013 1046   Sepsis Labs: Invalid input(s): PROCALCITONIN, LACTICIDVEN  Microbiology: Recent Results (from the past 240 hours)  Blood culture (routine x 2)     Status: None (Preliminary result)   Collection  Time: 05/16/24  4:43 PM   Specimen: BLOOD  Result Value Ref Range Status   Specimen Description BLOOD BLOOD RIGHT HAND  Final   Special Requests   Final    BOTTLES DRAWN AEROBIC AND ANAEROBIC BOTTLES DRAWN AEROBIC ONLY   Culture   Final    NO GROWTH 3 DAYS Performed at [facility], [address] [place], [place] [address]    Report Status PENDING  Incomplete    Radiology Studies: DG Forearm Left Result Date: 05/19/2024 CLINICAL DATA:  Forearm swelling. EXAM: LEFT FOREARM - 2  VIEW COMPARISON:  None Available. FINDINGS: Proximal forearm edema. No underlying osseous erosion to suggest osteomyelitis. IMPRESSION: Proximal forearm swelling.  No underlying osseous abnormality. Electronically Signed   By: Newell Eke M.D.   On: 05/19/2024 13:33   DG Chest 2 View Result Date: 05/19/2024 CLINICAL DATA:  Pleural effusion, left arm swelling. EXAM: CHEST - 2 VIEW COMPARISON:  05/16/2024 and CT chest 07/15/2022. FINDINGS: Right IJ Port-A-Cath terminates in the SVC. Heart is enlarged, stable. Thoracic aorta is calcified. Lungs are low in volume with central pulmonary vascular congestion and a moderate layering left pleural effusion. Left basilar collapse/consolidation. Findings are similar to 05/16/2024. IMPRESSION: 1. Moderately layering with pleural effusion with left basilar collapse/consolidation, due to atelectasis or pneumonia. 2. Central pulmonary vascular congestion. Electronically Signed   By: Newell Eke M.D.   On: 05/19/2024 13:32       Aluel Schwarz T. Sterling Mondo [facility]  If 7PM-7AM, please contact night-coverage [email or web address] 05/19/2024, 2:41 PM

## 2024-05-19 NOTE — Progress Notes (Signed)
 PHARMACY - ANTICOAGULATION CONSULT NOTE  Pharmacy Consult for Apixaban  Indication: DVT  Allergies  Allergen Reactions   Amlodipine  Swelling    Knees down to ankles Knees down to ankles   Hctz [Hydrochlorothiazide]     pancreatitis   Heparin  Other (See Comments)    Hx  HIT,   Pt reports cardiac arrest when given heparin    Lmw Heparin      Reports cardiac arrest when given heparin    Other Other (See Comments)    Reports cardiac arrest when given during surgery Reports cardiac arrest when given during surgery Reports cardiac arrest when given during surgery    Sulfa Antibiotics Rash   Lactose    Dairy Aid [Tilactase]     Runny nose   Hydralazine      Nausea and rash    Patient Measurements: Height: 5' 3 (160 cm) Weight: 58.6 kg (129 lb 3 oz) IBW/kg (Calculated) : 52.4  Vital Signs: Temp: 98.2 F (36.8 C) (09/07 1515) Temp Source: Oral (09/07 1515) BP: 164/48 (09/07 1515) Pulse Rate: 74 (09/07 1515)  Labs: Recent Labs    05/17/24 0047 05/18/24 0305 05/19/24 0440  HGB 9.8* 8.1* 8.6*  HCT 31.7* 26.1* 27.7*  PLT 266 258 263  CREATININE 4.94* 2.99* 3.87*    Estimated Creatinine Clearance: 11.8 mL/min (A) (by C-G formula based on SCr of 3.87 mg/dL (H)).   Medical History: Past Medical History:  Diagnosis Date   Anemia 04/2018   low iron. to be started on supplements   Cervical cancer (HCC)    CKD (chronic kidney disease)    Stage IV   Complication of anesthesia    receceived too much anesthesia, that she was in coma for a couple days    COVID-19 virus detected 10/28/2019   Diabetes mellitus without complication (HCC)    type II   ESRD (end stage renal disease) (HCC)    Heart murmur    followed as a child only   HSIL (high grade squamous intraepithelial lesion) on Pap smear of cervix    Hyperlipidemia associated with type 2 diabetes mellitus (HCC)    Hypertension    Pancreatitis    Peripheral vascular disease (HCC)     Medications:  Medications  Prior to Admission  Medication Sig Dispense Refill Last Dose/Taking   acetaminophen  (TYLENOL ) 325 MG tablet Take 2 tablets (650 mg total) by mouth every 4 (four) hours as needed for mild pain (pain score 1-3) or fever (or temp > 37.5 C (99.5 F)).      aspirin  EC 81 MG tablet Take 1 tablet (81 mg total) by mouth daily. Swallow whole. 30 tablet 12    atorvastatin  (LIPITOR) 40 MG tablet Take 1 tablet (40 mg total) by mouth daily.      b complex-vitamin c-folic acid  (NEPHRO-VITE) 0.8 MG TABS tablet Take 1 tablet by mouth at bedtime. 30 tablet 2    bisacodyl  (DULCOLAX) 5 MG EC tablet Take 1 tablet (5 mg total) by mouth daily as needed for moderate constipation.      carvedilol  (COREG ) 25 MG tablet Take 1 tablet (25 mg total) by mouth 2 (two) times daily with a meal. 60 tablet 3    cloNIDine  (CATAPRES  - DOSED IN MG/24 HR) 0.3 mg/24hr patch Place 1 patch (0.3 mg total) onto the skin once a week. 4 patch 12    diltiazem  (CARDIZEM  CD) 180 MG 24 hr capsule Take 1 capsule (180 mg total) by mouth daily.      doxycycline (VIBRAMYCIN) 100  MG capsule Take 100 mg by mouth 2 (two) times daily.      ferrous sulfate  325 (65 FE) MG tablet Take 325 mg by mouth daily.      hydrALAZINE  (APRESOLINE ) 25 MG tablet Take 1 tablet (25 mg total) by mouth every 6 (six) hours as needed (SBP >160).      isosorbide  mononitrate (IMDUR ) 120 MG 24 hr tablet Take 120 mg by mouth daily. (Patient not taking: Reported on 05/16/2024)      losartan  (COZAAR ) 100 MG tablet SMARTSIG:1 Tablet(s) By Mouth Every Evening      pantoprazole  (PROTONIX ) 40 MG tablet Take 1 tablet (40 mg total) by mouth daily.      QUEtiapine  (SEROQUEL ) 100 MG tablet Take 1 tablet (100 mg total) by mouth at bedtime. (Patient not taking: Reported on 05/16/2024)      QUEtiapine  (SEROQUEL ) 25 MG tablet Take 1 tablet (25 mg total) by mouth every 8 (eight) hours as needed (psychosis/anxiety). (Patient not taking: Reported on 05/16/2024)      QUEtiapine  (SEROQUEL ) 25 MG tablet  Take 0.5 tablets (12.5 mg total) by mouth 3 (three) times daily. (Patient not taking: Reported on 05/16/2024)      SENEXON-S 8.6-50 MG tablet Take 1 tablet by mouth daily.      spironolactone  (ALDACTONE ) 25 MG tablet Take 1 tablet (25 mg total) by mouth daily.      traZODone  (DESYREL ) 50 MG tablet Take 1 tablet (50 mg total) by mouth at bedtime.      vancomycin  (VANCOCIN ) 125 MG capsule Take 1 capsule (125 mg total) by mouth 4 (four) times daily. 14 capsule 0    Scheduled:   apixaban   10 mg Oral BID   Followed by   [name] ON 05/26/2024] apixaban   5 mg Oral BID   atorvastatin   40 mg Oral Daily   carvedilol   25 mg Oral BID WC   [START ON 05/20/2024] Chlorhexidine  Gluconate Cloth  6 each Topical Q0600   cloNIDine   0.3 mg Transdermal Weekly   diltiazem   360 mg Oral Daily   feeding supplement  1 Container Oral TID BM   ferrous sulfate   325 mg Oral Daily   fidaxomicin   200 mg Oral BID   losartan   100 mg Oral Daily   mirtazapine   7.5 mg Oral QHS   pantoprazole   40 mg Oral Daily   sodium chloride  flush  10-40 mL Intracatheter Q12H   spironolactone   25 mg Oral Daily    Assessment: Consulted for therapeutic anticoagulation is apixaban  for DVT Goal of Therapy:  Therapeutic anticoagulation Monitor platelets by anticoagulation protocol: Yes   Plan:  Apixaban  10 mg BID X 14 doses then Apixaban  5 mg BID after  Tracy Jennings 05/19/2024,7:04 PM

## 2024-05-19 NOTE — Progress Notes (Addendum)
 [facility] Progress Note   Subjective:    Seen and examined patient at bedside. Hospitalist also at bedside. Noted current hypokalemia and PO KCL X 1 already ordered by Hospitalist. LUE edema appears to be coming down. No issues with AVG noted thus far. Patient is malnourished, thus, diet is liberalized. Also persistent leukocytosis, w/u by Hospitalist. Palliative consulted. Next HD 9/8.  Objective Vitals:   05/17/24 2330 05/18/24 0422 05/18/24 2037 05/19/24 0345  BP: 127/60 (!) 145/56 (!) 153/49 (!) 159/61  Pulse:  62 68 70  Resp: 16  16   Temp: 97.6 F (36.4 C) 97.7 F (36.5 C) 98.2 F (36.8 C) 98 F (36.7 C)  TempSrc: Oral Oral Oral Oral  SpO2:  92% 97% 96%  Weight:      Height:       Physical Exam General: Awake, chronically ill-appearing; thin, on RA, NAD Heart: S1 and S2; No murmurs, gallops, rubs Lungs: Clear anteriorly Abdomen:Soft and non-tender Extremities: No LE edema Dialysis Access: L AVG   Filed Weights   05/17/24 1938  Weight: 58.6 kg    Intake/Output Summary (Last 24 hours) at 05/19/2024 1548 Last data filed at 05/18/2024 2230 Gross per 24 hour  Intake 50 ml  Output --  Net 50 ml    Additional Objective Labs: Basic Metabolic Panel: Recent Labs  Lab 05/17/24 0047 05/18/24 0305 05/19/24 0440  NA 142 136 136  K 3.6 3.5 3.3*  CL 104 98 98  CO2 23 25 25   GLUCOSE 80 116* 147*  BUN 23 12 22   CREATININE 4.94* 2.99* 3.87*  CALCIUM  8.6* 8.0* 8.1*  PHOS  --  2.2* 3.1   Liver Function Tests: Recent Labs  Lab 05/16/24 1308 05/17/24 0047 05/18/24 0305 05/19/24 0440  AST 12* 13*  --   --   ALT 7 8  --   --   ALKPHOS 55 66  --   --   BILITOT 1.3* 1.1  --   --   PROT 5.8* 6.0*  --   --   ALBUMIN 2.0* 1.9* 1.7* 1.6*   Recent Labs  Lab 05/16/24 1308  LIPASE 22   CBC: Recent Labs  Lab 05/16/24 1308 05/17/24 0047 05/18/24 0305 05/19/24 0440  WBC 21.7* 21.7* 20.5* 21.4*  NEUTROABS 20.2* 21.3* 19.5* 20.8*  HGB 8.3* 9.8*  8.1* 8.6*  HCT 26.6* 31.7* 26.1* 27.7*  MCV 97.4 97.8 96.3 96.2  PLT 252 266 258 263   Blood Culture    Component Value Date/Time   SDES BLOOD BLOOD RIGHT HAND 05/16/2024 1643   SPECREQUEST  05/16/2024 1643    BOTTLES DRAWN AEROBIC AND ANAEROBIC BOTTLES DRAWN AEROBIC ONLY   CULT  05/16/2024 1643    NO GROWTH 3 DAYS Performed at [facility], [address] [place], [place] [address]    REPTSTATUS PENDING 05/16/2024 1643    Cardiac Enzymes: No results for input(s): CKTOTAL, CKMB, CKMBINDEX, TROPONINI in the last 168 hours. CBG: Recent Labs  Lab 05/18/24 1215 05/18/24 1656 05/18/24 2142 05/19/24 0809 05/19/24 1210  GLUCAP 101* 89 87 122* 125*   Iron Studies: No results for input(s): IRON, TIBC, TRANSFERRIN, FERRITIN in the last 72 hours. Lab Results  Component Value Date   INR 1.3 (H) 03/05/2024   INR 1.1 11/03/2019   INR 1.04 05/12/2018   Studies/Results: DG Forearm Left Result Date: 05/19/2024 CLINICAL DATA:  Forearm swelling. EXAM: LEFT FOREARM - 2 VIEW COMPARISON:  None Available. FINDINGS: Proximal forearm edema. No underlying osseous  erosion to suggest osteomyelitis. IMPRESSION: Proximal forearm swelling.  No underlying osseous abnormality. Electronically Signed   By: Newell Eke M.D.   On: 05/19/2024 13:33   DG Chest 2 View Result Date: 05/19/2024 CLINICAL DATA:  Pleural effusion, left arm swelling. EXAM: CHEST - 2 VIEW COMPARISON:  05/16/2024 and CT chest 07/15/2022. FINDINGS: Right IJ Port-A-Cath terminates in the SVC. Heart is enlarged, stable. Thoracic aorta is calcified. Lungs are low in volume with central pulmonary vascular congestion and a moderate layering left pleural effusion. Left basilar collapse/consolidation. Findings are similar to 05/16/2024. IMPRESSION: 1. Moderately layering with pleural effusion with left basilar collapse/consolidation, due to atelectasis or pneumonia. 2. Central pulmonary vascular congestion.  Electronically Signed   By: Newell Eke M.D.   On: 05/19/2024 13:32    Medications:  metronidazole  500 mg (05/19/24 1336)    atorvastatin   40 mg Oral Daily   carvedilol   25 mg Oral BID WC   Chlorhexidine  Gluconate Cloth  6 each Topical Daily   cloNIDine   0.3 mg Transdermal Weekly   diltiazem   360 mg Oral Daily   feeding supplement  1 Container Oral TID BM   ferrous sulfate   325 mg Oral Daily   fidaxomicin   200 mg Oral BID   losartan   100 mg Oral Daily   mirtazapine   7.5 mg Oral QHS   pantoprazole   40 mg Oral Daily   sodium chloride  flush  10-40 mL Intracatheter Q12H   spironolactone   25 mg Oral Daily    Dialysis Orders: MWF [facility]  3.5hrs BFR 400 DFR 500 EDW 53kg 2K/2.5Ca Heparin  bolus 600 units with HD then 400 units/hr Calcitriol  1.5mcg with HD - last dose 9/3 Sensipar 60mg  with HD - last dose 9/3 Micera 75mcg Q 4 weeks - per records, dose just ordered Calcium  Acetate 667mh TID with meals   Assessment/Plan: Colo-uterine fistula: per abd CT. Per surgery/ pmd Cdif colitis: per pmd Leukocytosis: Per Primary: CXR showed L pleural effusion. Thoracentesis ordered. Blood cx 9/4 NGTD. ID consult if no improvement LUE edema: LUE duplex ordered by Hospitalist. No issues with AVG thus far. Monitor. ESRD: on HD MWF. Next HD 9/8. HTN: Not overloaded but Bps are up. On 5 BP agents here. Noted allergy to Amlodipine .  Volume: euvolemic on exam, no edema, CXR no edema. Didn't reach max UF 9/5 2nd hypotension. She is nowhere near being overloaded. No UF with HD tomorrow. 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 Holding binders, VDRA, and Sensipar for now.  GOC: Palliative consulted  Charmaine Piety, NP [facility] 05/19/2024,3:48 PM  LOS: 3 days

## 2024-05-19 NOTE — Progress Notes (Signed)
 VASCULAR LAB    Left upper extremity venous duplex has been performed.  See CV proc for preliminary results.   Avyon Herendeen, RVT 05/19/2024, 5:16 PM

## 2024-05-20 ENCOUNTER — Inpatient Hospital Stay (HOSPITAL_COMMUNITY)

## 2024-05-20 ENCOUNTER — Telehealth (HOSPITAL_COMMUNITY): Payer: Self-pay | Admitting: Pharmacy Technician

## 2024-05-20 ENCOUNTER — Other Ambulatory Visit (HOSPITAL_COMMUNITY): Payer: Self-pay

## 2024-05-20 DIAGNOSIS — J90 Pleural effusion, not elsewhere classified: Secondary | ICD-10-CM

## 2024-05-20 DIAGNOSIS — D72829 Elevated white blood cell count, unspecified: Secondary | ICD-10-CM | POA: Diagnosis not present

## 2024-05-20 DIAGNOSIS — N823 Fistula of vagina to large intestine: Secondary | ICD-10-CM | POA: Diagnosis not present

## 2024-05-20 DIAGNOSIS — F03918 Unspecified dementia, unspecified severity, with other behavioral disturbance: Secondary | ICD-10-CM | POA: Diagnosis not present

## 2024-05-20 DIAGNOSIS — A0472 Enterocolitis due to Clostridium difficile, not specified as recurrent: Secondary | ICD-10-CM | POA: Diagnosis not present

## 2024-05-20 DIAGNOSIS — C539 Malignant neoplasm of cervix uteri, unspecified: Secondary | ICD-10-CM | POA: Diagnosis not present

## 2024-05-20 DIAGNOSIS — K529 Noninfective gastroenteritis and colitis, unspecified: Secondary | ICD-10-CM

## 2024-05-20 DIAGNOSIS — N824 Other female intestinal-genital tract fistulae: Secondary | ICD-10-CM | POA: Diagnosis not present

## 2024-05-20 LAB — RENAL FUNCTION PANEL
Albumin: 1.6 g/dL — ABNORMAL LOW (ref 3.5–5.0)
Anion gap: 12 (ref 5–15)
BUN: 28 mg/dL — ABNORMAL HIGH (ref 8–23)
CO2: 25 mmol/L (ref 22–32)
Calcium: 7.8 mg/dL — ABNORMAL LOW (ref 8.9–10.3)
Chloride: 97 mmol/L — ABNORMAL LOW (ref 98–111)
Creatinine, Ser: 4.54 mg/dL — ABNORMAL HIGH (ref 0.44–1.00)
GFR, Estimated: 10 mL/min — ABNORMAL LOW (ref >60)
Glucose, Bld: 110 mg/dL — ABNORMAL HIGH (ref 70–99)
Phosphorus: 3.4 mg/dL (ref 2.5–4.6)
Potassium: 3.4 mmol/L — ABNORMAL LOW (ref 3.5–5.1)
Sodium: 134 mmol/L — ABNORMAL LOW (ref 135–145)

## 2024-05-20 LAB — CBC WITH DIFFERENTIAL/PLATELET
Abs Immature Granulocytes: 0.16 K/uL — ABNORMAL HIGH (ref 0.00–0.07)
Basophils Absolute: 0.1 K/uL (ref 0.0–0.1)
Basophils Relative: 0 %
Eosinophils Absolute: 0 K/uL (ref 0.0–0.5)
Eosinophils Relative: 0 %
HCT: 27.4 % — ABNORMAL LOW (ref 36.0–46.0)
Hemoglobin: 8.4 g/dL — ABNORMAL LOW (ref 12.0–15.0)
Immature Granulocytes: 1 %
Lymphocytes Relative: 1 %
Lymphs Abs: 0.3 K/uL — ABNORMAL LOW (ref 0.7–4.0)
MCH: 29.4 pg (ref 26.0–34.0)
MCHC: 30.7 g/dL (ref 30.0–36.0)
MCV: 95.8 fL (ref 80.0–100.0)
Monocytes Absolute: 0.7 K/uL (ref 0.1–1.0)
Monocytes Relative: 4 %
Neutro Abs: 18.5 K/uL — ABNORMAL HIGH (ref 1.7–7.7)
Neutrophils Relative %: 94 %
Platelets: 258 K/uL (ref 150–400)
RBC: 2.86 MIL/uL — ABNORMAL LOW (ref 3.87–5.11)
RDW: 16.3 % — ABNORMAL HIGH (ref 11.5–15.5)
Smear Review: NORMAL
WBC: 19.7 K/uL — ABNORMAL HIGH (ref 4.0–10.5)
nRBC: 0 % (ref 0.0–0.2)

## 2024-05-20 LAB — GLUCOSE, CAPILLARY
Glucose-Capillary: 99 mg/dL (ref 70–99)
Glucose-Capillary: 90 mg/dL (ref 70–99)
Glucose-Capillary: 80 mg/dL (ref 70–99)
Glucose-Capillary: 91 mg/dL (ref 70–99)

## 2024-05-20 LAB — PROTEIN, TOTAL: Total Protein: 5.7 g/dL — ABNORMAL LOW (ref 6.5–8.1)

## 2024-05-20 LAB — LACTATE DEHYDROGENASE: LDH: 174 U/L (ref 98–192)

## 2024-05-20 MED ORDER — THIAMINE MONONITRATE 100 MG PO TABS
100.0000 mg | ORAL_TABLET | Freq: Every day | ORAL | Status: DC
Start: 2024-05-21 — End: 2024-06-14
  Administered 2024-05-21 – 2024-06-13 (×23): 100 mg via ORAL
  Filled 2024-05-20 (×24): qty 1

## 2024-05-20 MED ORDER — PENTAFLUOROPROP-TETRAFLUOROETH EX AERO: 1.0000 | INHALATION_SPRAY | CUTANEOUS | Status: DC | PRN

## 2024-05-20 MED ORDER — AMOXICILLIN-POT CLAVULANATE 500-125 MG PO TABS
1.0000 | ORAL_TABLET | Freq: Two times a day (BID) | ORAL | Status: AC
  Administered 2024-05-20 – 2024-05-29 (×20): 1 via ORAL
  Filled 2024-05-20 (×22): qty 1

## 2024-05-20 MED ORDER — RENA-VITE PO TABS
1.0000 | ORAL_TABLET | Freq: Every day | ORAL | Status: DC
  Administered 2024-05-20 – 2024-06-13 (×25): 1 via ORAL
  Filled 2024-05-20 (×25): qty 1

## 2024-05-20 MED ORDER — PROSOURCE PLUS PO LIQD
30.0000 mL | Freq: Two times a day (BID) | ORAL | Status: DC
  Administered 2024-05-21 – 2024-05-22 (×3): 30 mL via ORAL
  Filled 2024-05-20 (×3): qty 30

## 2024-05-20 MED ORDER — ACETAMINOPHEN 325 MG PO TABS
ORAL_TABLET | ORAL | Status: AC
  Filled 2024-05-20: qty 2

## 2024-05-20 NOTE — Progress Notes (Signed)
  Received patient in bed to unit.   Informed consent signed and in chart.    TX duration:3.25     Transported by  Hand-off given to patient's nurse.    Access used: Left graft Access issues: none   Total UF removed: 0.1 Medication(s) given: tylenol  650 mg Post HD VS: 130/57     Hunter Hacking LPN [facility]

## 2024-05-20 NOTE — Progress Notes (Signed)
 Interventional Radiology Brief Note:  Patient brought to [facility] Radiology for possible thoracentesis.  Very small left-sided pleural effusion identified with lung flap in field of view. No procedure performed due to risk outweighing benefit.   Patient returned to unit.   Tashya Alberty, MS RD PA-C 11:07 AM

## 2024-05-20 NOTE — Progress Notes (Signed)
 PT Cancellation Note  Patient Details Name: Tracy Jennings MRN: [id number] DOB: 07-04-1956   Cancelled Treatment:    Reason Eval/Treat Not Completed: (P) Patient at procedure or test/unavailable, pt off unit in IR in AM and at HD in PM. Will check back as schedule allows to continue with PT POC.  [name]. PTA Acute Rehabilitation Services Office: [phone]    [name] 05/20/2024, 1:27 PM

## 2024-05-20 NOTE — Progress Notes (Signed)
 Pt arrived back to 6 north room 15 from HD alert. Husband at bedside. Bed in lowest position. Call light in reach. All needs met at this time.

## 2024-05-20 NOTE — Progress Notes (Signed)
 PROGRESS NOTE  Tracy Jennings FMW:[id number] DOB: [birth year]   PCP: George, Sionne A, MD  Patient is from: Home.  Lives alone.  Uses rolling walker at baseline.  Was able to take care of her ADLs including cooking until her recent hospitalization at [facility] in July.   DOA: 05/16/2024 LOS: 4  Chief complaints No chief complaint on file.    Brief Narrative / Interim history: 68 year old F with PMH of cognitive impairment, ESRD on HD MWF, cervical cancer, HTN, HLD, anemia and recent hospitalizations from 6/24-7/22 for hypertensive urgency and delirium, and 8/17-8/24 for C. difficile colitis for which she was treated with p.o. vancomycin  brought back to [facility] from PCP office due to weakness, nausea, diarrhea and abdominal discomfort.  In ED, WBC 21.7.  Hgb 8.3.  Troponin 37.  CT abdomen and pelvis showed circumferential wall thickening of rectosigmoid colon suspicious for colitis, Colo uterine fistula, increased bilateral pleural effusions, left > right and area of rounded density in anterior LLL measuring 3.1 cm, and progressive anasarca.  General surgery at [facility] was consulted and recommended higher level of care and the ER physician discussed with Dr. Dasie general surgeon at [facility] who agreed to see patient in consult.   Patient was started on Dificid  and Flagyl  for C. difficile colitis.  Evaluated by general surgery, and they recommended elective management, and treating for C. difficile..   Persistent leukocytosis.   Subjective: Seen and examined earlier this morning.  No major events overnight or this morning.  Left pleural effusion felt to be small for thoracocentesis.  Currently on HD.  Objective: Vitals:   05/20/24 0810 05/20/24 0830 05/20/24 1306 05/20/24 1324  BP: (!) 189/51 (!) 189/51 (!) 121/54 (!) 121/48  Pulse: 71  69 66  Resp:   (!) 95 15  Temp: 98.3 F (36.8 C)  98 F (36.7 C)   TempSrc: Oral     SpO2: 99%  100% 97%  Weight:   58.3 kg   Height:         Examination:  GENERAL: Appears frail.  No apparent distress. HEENT: MMM.  Vision and hearing grossly intact.  NECK: Supple.  No apparent JVD.  RESP:  No IWOB.  Fair aeration bilaterally. CVS:  RRR. Heart sounds normal.  ABD/GI/GU: BS+. Abd soft, NTND.  MSK/EXT:  Moves extremities.  Significant muscle mass and subcu fat loss.  Left forearm swelling improved. SKIN: no apparent skin lesion or wound NEURO: Sleepy but wakes to voice.  Oriented to self and family.   Follows commands.  No apparent focal neuro deficit. PSYCH: Calm. Normal affect.   Consultants:  General Surgery Infectious disease Interventional radiology   Procedures: None  Microbiology summarized: 9/4-single set blood culture at [facility].  Assessment and plan: C. difficile colitis: Completed 10 days of oral vancomycin  about a week ago.  Ongoing abdominal pain and diarrhea.  CT suggested rectosigmoid colitis.  She has loose stool but not frequently.  Abdominal exam benign.  Still with significant leukocytosis - Continue Dificid  and IV Flagyl  - Continue soft diet. 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  Abdominal exam benign. - General Surgery recommends elective management and treating C. difficile. - Continue soft diet.  Leukocytosis/bandemia: Persisted despite treatment of C. difficile.  Has no respiratory symptoms but chronic left pleural effusion which was felt to be small to tap.  Left forearm swelling but no signs of infection.  Incidental right IJ acute DVT.  Blood culture negative on  9/4.  She also have chronic left pleural effusion with LLL density which raises concern for malignancy. - ID consulted - Needs repeat CT in 4 to 6 weeks for LLL mass.  ESRD on HD MWF-last HD on Wednesday.  No emergent need. - Per nephrology.  Uncontrolled hypertension: Improved but still elevated. -Continue home clonidine  patch, losartan , Coreg , hydralazine  and Aldactone  -Cardizem   increased from 180 mg daily to 360 mg daily on 9/5 -Ultrafiltration per nephrology. -Continue monitoring  Dementia without behavioral disturbance: Oriented to self and family. - Reorientation and delirium precaution  Layering left pleural effusion/LLL mass/density: Seems chronic.  CT chest showed anterior LLL rounded density measuring 3.1 cm.  Malignancy?  Repeat CXR with moderate right layering left pleural effusion with left basilar collapse/consolidation.  IR consulted for thoracocentesis but pleural effusion felt to be small to tap safely. - Needs follow-up CT in 4 to 6 weeks  Failure to thrive/poor appetite/unintentional weight loss: family reports significant weight loss since her hospitalization in July 2025.  Not able to quantify.  Appetite remains poor even after addition of low-dose Remeron .  Dietitian recommends tube feed -Continue low-dose Remeron  for now -Appreciate input by dietitian-recommends cortrack and tube feed. -Appreciate input by palliative-full code with full scope of care per family  Anemia of renal disease: H&H relatively stable.  No overt bleeding such as melena or hematochezia. Recent Labs    04/29/24 0511 04/30/24 0429 05/02/24 0922 05/03/24 0801 05/04/24 0338 05/16/24 1308 05/17/24 0047 05/18/24 0305 05/19/24 0440 05/20/24 0555  HGB 7.6* 7.5* 10.1* 9.2* 9.9* 8.3* 9.8* 8.1* 8.6* 8.4*  - Continue monitoring  Acute right IJ DVT: Incidental finding.  Venous Doppler was ordered for left forearm swelling but showed acute right IJ DVT. - Started on Eliquis   Physical deconditioning - PT/OT.  SNF recommended but family prefers to take her home with [facility] due to bad experience in the past  History of uterine cancer: - Outpatient follow-up.  Hypokalemia -Monitor replenish K and Mg as appropriate  Severe malnutrition Body mass index is 22.77 kg/m. Nutrition Problem: Severe Malnutrition Etiology: chronic illness Signs/Symptoms: severe muscle depletion,  severe fat depletion, meal completion < 25%, per patient/family report, energy intake < or equal to 75% for > or equal to 1 month Interventions: Refer to RD note for recommendations   DVT prophylaxis:  Place and maintain sequential compression device Start: 05/17/24 1416 SCDs Start: 05/17/24 0000 apixaban  (ELIQUIS ) tablet 10 mg  apixaban  (ELIQUIS ) tablet 5 mg  Code Status: Full code Family Communication: Updated patient's brother at the bedside this morning Level of care: Telemetry Medical Status is: Inpatient Remains inpatient appropriate because: C. difficile colitis, Colouterine fistula and persistent leukocytosis   Final disposition: Likely home with home health in the next 24 to 48 hours  55 minutes with more than 50% spent in reviewing records, counseling patient/family and coordinating care.   Sch Meds:  Scheduled Meds:  apixaban   10 mg Oral BID   Followed by   [name] ON 05/26/2024] apixaban   5 mg Oral BID   atorvastatin   40 mg Oral Daily   carvedilol   25 mg Oral BID WC   Chlorhexidine  Gluconate Cloth  6 each Topical Q0600   cloNIDine   0.3 mg Transdermal Weekly   diltiazem   360 mg Oral Daily   feeding supplement  1 Container Oral TID BM   ferrous sulfate   325 mg Oral Daily   fidaxomicin   200 mg Oral BID   losartan   100 mg Oral Daily  mirtazapine   7.5 mg Oral QHS   pantoprazole   40 mg Oral Daily   sodium chloride  flush  10-40 mL Intracatheter Q12H   spironolactone   25 mg Oral Daily   Continuous Infusions:  metronidazole  500 mg (05/20/24 1210)   PRN Meds:.acetaminophen  **OR** acetaminophen , hydrALAZINE , mouth rinse  Antimicrobials: Anti-infectives (From admission, onward)    Start     Dose/Rate Route Frequency Ordered Stop   05/17/24 0100  fidaxomicin  (DIFICID ) tablet 200 mg        200 mg Oral 2 times daily 05/17/24 0003 05/26/24 2159   05/17/24 0100  metroNIDAZOLE  (FLAGYL ) IVPB 500 mg        500 mg 100 mL/hr over 60 Minutes Intravenous Every 12 hours 05/17/24  0003          I have personally reviewed the following labs and images: CBC: Recent Labs  Lab 05/16/24 1308 05/17/24 0047 05/18/24 0305 05/19/24 0440 05/20/24 0555  WBC 21.7* 21.7* 20.5* 21.4* 19.7*  NEUTROABS 20.2* 21.3* 19.5* 20.8* 18.5*  HGB 8.3* 9.8* 8.1* 8.6* 8.4*  HCT 26.6* 31.7* 26.1* 27.7* 27.4*  MCV 97.4 97.8 96.3 96.2 95.8  PLT 252 266 258 263 258   BMP &GFR Recent Labs  Lab 05/16/24 1308 05/17/24 0047 05/18/24 0305 05/19/24 0440 05/20/24 0555  NA 139 142 136 136 134*  K 3.4* 3.6 3.5 3.3* 3.4*  CL 102 104 98 98 97*  CO2 24 23 25 25 25   GLUCOSE 86 80 116* 147* 110*  BUN 24* 23 12 22  28*  CREATININE 4.34* 4.94* 2.99* 3.87* 4.54*  CALCIUM  8.0* 8.6* 8.0* 8.1* 7.8*  MG  --   --  1.8 2.0  --   PHOS  --   --  2.2* 3.1 3.4   Estimated Creatinine Clearance: 10.1 mL/min (A) (by C-G formula based on SCr of 4.54 mg/dL (H)). Liver & Pancreas: Recent Labs  Lab 05/16/24 1308 05/17/24 0047 05/18/24 0305 05/19/24 0440 05/20/24 0555 05/20/24 1010  AST 12* 13*  --   --   --   --   ALT 7 8  --   --   --   --   ALKPHOS 55 66  --   --   --   --   BILITOT 1.3* 1.1  --   --   --   --   PROT 5.8* 6.0*  --   --   --  5.7*  ALBUMIN 2.0* 1.9* 1.7* 1.6* 1.6*  --    Recent Labs  Lab 05/16/24 1308  LIPASE 22   No results for input(s): AMMONIA in the last 168 hours. Diabetic: No results for input(s): HGBA1C in the last 72 hours. Recent Labs  Lab 05/19/24 1210 05/19/24 1721 05/19/24 2231 05/20/24 0808 05/20/24 1228  GLUCAP 125* 114* 101* 99 90   Cardiac Enzymes: No results for input(s): CKTOTAL, CKMB, CKMBINDEX, TROPONINI in the last 168 hours. No results for input(s): PROBNP in the last 8760 hours. Coagulation Profile: No results for input(s): INR, PROTIME in the last 168 hours. Thyroid Function Tests: No results for input(s): TSH, T4TOTAL, FREET4, T3FREE, THYROIDAB in the last 72 hours. Lipid Profile: No results for input(s):  CHOL, HDL, LDLCALC, TRIG, CHOLHDL, LDLDIRECT in the last 72 hours. Anemia Panel: No results for input(s): VITAMINB12, FOLATE, FERRITIN, TIBC, IRON, RETICCTPCT in the last 72 hours. Urine analysis:    Component Value Date/Time   COLORURINE YELLOW (A) 10/31/2017 1959   APPEARANCEUR HAZY (A) 10/31/2017 1959   APPEARANCEUR Hazy 12/09/2013  1046   LABSPEC 1.017 10/31/2017 1959   LABSPEC 1.031 12/09/2013 1046   PHURINE 6.0 10/31/2017 1959   GLUCOSEU >=500 (A) 10/31/2017 1959   GLUCOSEU >=500 12/09/2013 1046   HGBUR NEGATIVE 10/31/2017 1959   BILIRUBINUR NEGATIVE 10/31/2017 1959   BILIRUBINUR Negative 12/09/2013 1046   KETONESUR NEGATIVE 10/31/2017 1959   PROTEINUR >=300 (A) 10/31/2017 1959   NITRITE NEGATIVE 10/31/2017 1959   LEUKOCYTESUR NEGATIVE 10/31/2017 1959   LEUKOCYTESUR Negative 12/09/2013 1046   Sepsis Labs: Invalid input(s): PROCALCITONIN, LACTICIDVEN  Microbiology: Recent Results (from the past 240 hours)  Blood culture (routine x 2)     Status: None (Preliminary result)   Collection Time: 05/16/24  4:43 PM   Specimen: BLOOD  Result Value Ref Range Status   Specimen Description BLOOD BLOOD RIGHT HAND  Final   Special Requests   Final    BOTTLES DRAWN AEROBIC AND ANAEROBIC BOTTLES DRAWN AEROBIC ONLY   Culture   Final    NO GROWTH 4 DAYS Performed at [facility], [address], [place], [place] [address]    Report Status PENDING  Incomplete    Radiology Studies: IR US  CHEST Result Date: 05/20/2024 INDICATION: 68 year old female with renal dysfunction, C diff colitis with left pleural effusion by abdominopelvic CT imaging. Left thoracentesis requested. EXAM: CHEST ULTRASOUND COMPARISON:  None Available. FINDINGS: Small left-sided pleural effusion. No significant pocket of fluid or percutaneous window to allow safe thoracentesis. Risks outweigh the benefits. IMPRESSION: Small left-sided pleural effusion with no safe window for  percutaneous access Performed by: Kacie Matthews PA-C Electronically Signed   By: CHRISTELLA.  Shick M.D.   On: 05/20/2024 11:23   VAS US  UPPER EXTREMITY VENOUS DUPLEX Result Date: 05/19/2024 UPPER VENOUS STUDY  Patient Name:  Tracy Jennings  Date of Exam:   05/19/2024 Medical Rec #: [id number]         Accession #:    [id number] Date of Birth: March 20, 1956         Patient Gender: F Patient Age:   32 years Exam Location:  [facility] Procedure:      VAS US  UPPER EXTREMITY VENOUS DUPLEX Referring Phys: MIGNON Renell Allum --------------------------------------------------------------------------------  Indications: Left forearm swelling Risk Factors: Cancer cervical, on chemotherapy ESRD on dialysis. Limitations: Poor ultrasound/tissue interface, musculoskeletal features and Patient with dementia/non compliant with directions. Had to call staff to help with repositioning. Comparison Study: No prior study on file Performing Technologist: Alberta Lis RVS  Examination Guidelines: A complete evaluation includes B-mode imaging, spectral Doppler, color Doppler, and power Doppler as needed of all accessible portions of each vessel. Bilateral testing is considered an integral part of a complete examination. Limited examinations for reoccurring indications may be performed as noted.  Right Findings: +----------+------------+---------+-----------+----------+---------------------+ RIGHT     CompressiblePhasicitySpontaneousProperties       Summary        +----------+------------+---------+-----------+----------+---------------------+ IJV         Partial                                         Acute         +----------+------------+---------+-----------+----------+---------------------+ Subclavian                                           Not well visualized  secondary to port in                                                            chest and                                                               musculoskeletal                                                              features        +----------+------------+---------+-----------+----------+---------------------+  Left Findings: +----------+------------+---------+-----------+----------+--------------+ LEFT      CompressiblePhasicitySpontaneousProperties   Summary     +----------+------------+---------+-----------+----------+--------------+ IJV           Full       Yes       Yes                             +----------+------------+---------+-----------+----------+--------------+ Subclavian    Full       Yes       No                              +----------+------------+---------+-----------+----------+--------------+ Axillary                 Yes       Yes                             +----------+------------+---------+-----------+----------+--------------+ Radial        Full                                                 +----------+------------+---------+-----------+----------+--------------+ Ulnar         Full                                                 +----------+------------+---------+-----------+----------+--------------+ Cephalic                                            Not visualized +----------+------------+---------+-----------+----------+--------------+ Basilic       Full                                                 +----------+------------+---------+-----------+----------+--------------+  Summary:  Right:  Acute, non occlusive DVT in the right IJV.  Left: No evidence of deep vein thrombosis in the visualized veins of the left upper extremity. No evidence of superficial vein thrombosis in the visualized veins of the upper extremity. Significant subcutaneous edema throughout the left upper extremity. Acoustic shadowing throughout left upper arm. Limited visualization of dialysis access, but it appears patent.  *See  table(s) above for measurements and observations.  Diagnosing physician: Gaile New MD Electronically signed by Gaile New MD on 05/19/2024 at 8:27:08 PM.    Final        Vaishnavi Dalby T. Favio Moder [facility]  If 7PM-7AM, please contact night-coverage [email or web address] 05/20/2024, 1:28 PM

## 2024-05-20 NOTE — Progress Notes (Signed)
   05/20/24 1816  Vitals  BP 116/68  MAP (mmHg) 83  BP Location Right Arm  BP Method Automatic  Patient Position (if appropriate) Lying  Pulse Rate 62  Pulse Rate Source Monitor  Resp 14  Level of Consciousness  Level of Consciousness Alert  MEWS COLOR  MEWS Score Color Green  Oxygen Therapy  SpO2 100 %  O2 Device Room Air  Pain Assessment  Pain Scale 0-10  Pain Score 0  MEWS Score  MEWS Temp 0  MEWS Systolic 0  MEWS Pulse 0  MEWS RR 0  MEWS LOC 0  MEWS Score 0

## 2024-05-20 NOTE — Discharge Instructions (Addendum)
 Patient is being discharged to hospice of [place].

## 2024-05-20 NOTE — Plan of Care (Signed)
     Referral previously received for [name] for goals of care discussion. Noted most recent palliative in-person assessment dated 05/18/2024 at which time it was recommended to follow from a distance/chart check.   Chart reviewed for Recent provider notes, nurse notes, vitals, and labs. Noted for persistent leukocytosis (improving from 21.4 to 19.7)  may potentially be related to ongoing c-diff colitis currently being treated. Creatinine increased to 4.54 from 3.87 yesterday, scheduled for dialysis today. Blood pressure remains suboptimal, currently being addressed with antihypertensives.     At this time patient appears stable. Continue with medical management per family's desire including HD session scheduled for today. Plan unchanged, likely home with home health.  No plan for in person follow-up at this time. Please contact the palliative medicine provider on service for any new/urgent needs that require our assistance with this patient.  Thank you for your referral and allowing PMT to assist in Tracy Jennings's care.   Kathlyne FALCON. Lovetta Condie Jr., AGNP-C Palliative Medicine Team Phone: [phone]  NO CHARGE

## 2024-05-20 NOTE — Progress Notes (Signed)
 OT Cancellation Note  Patient Details Name: Tracy Jennings MRN: [id number] DOB: [birth year]   Cancelled Treatment:    Reason Eval/Treat Not Completed: Patient at procedure or test/ unavailable (in IR. will follow up as schedule allows and pt available.)  Zaakirah Kistner D Walton, OTD, OTR/L [facility] Acute Rehabilitation Office: [phone]   [name] 05/20/2024, 9:11 AM

## 2024-05-20 NOTE — Care Management Important Message (Signed)
 Important Message  Patient Details  Name: Tracy Jennings MRN: [id number] Date of Birth: [birth year]   Important Message Given:  Yes - Medicare IM     Jon Cruel 05/20/2024, 1:04 PM

## 2024-05-20 NOTE — Telephone Encounter (Signed)
 Patient Product/process development scientist completed.    The patient is insured through [facility]. Patient has Medicare and is not eligible for a copay card, but may be able to apply for patient assistance or Medicare RX Payment Plan (Patient Must reach out to their plan, if eligible for payment plan), if available.    Ran test claim for Eliquis  5 mg and the current 30 day co-pay is $121.79 due to a deductible.  Will be $47.00 once deductible is met.   This test claim was processed through [facility]- copay amounts may vary at other pharmacies due to pharmacy/plan contracts, or as the patient moves through the different stages of their insurance plan.     Reyes Sharps, CPHT Pharmacy Technician III Certified Patient Advocate [facility] Patient Advocate Team Direct Number: [phone]  Fax: [phone]

## 2024-05-20 NOTE — Progress Notes (Signed)
 Progress Note     Subjective: Patient minimally participates during encounter. Reports some generalized abdominal pain. Tolerating soft diet. Denies nausea and vomiting today. Unclear if patient had episode of emesis yesterday. Patient had a bowel movement yesterday.   ROS  All negative with the exception of above.  Objective: Vital signs in last 24 hours: Temp:  [97.5 F (36.4 C)-98.3 F (36.8 C)] 98.3 F (36.8 C) (09/08 0810) Pulse Rate:  [70-74] 71 (09/08 0810) Resp:  [16] 16 (09/07 1515) BP: (164-189)/(48-55) 189/51 (09/08 0830) SpO2:  [93 %-100 %] 99 % (09/08 0810) Last BM Date : 05/19/24  Intake/Output from previous day: No intake/output data recorded. Intake/Output this shift: No intake/output data recorded.  PE: General: Female who is laying in bed in NAD. Lungs: Respiratory effort nonlabored Abd: Soft, ND. Mild generalized tenderness to deep palpation. Scarring noted on abdomen. No rebound tenderness or guarding. Psych: A&Ox3 with an appropriate affect.  Skin: Warm and dry   Lab Results:  Recent Labs    05/19/24 0440 05/20/24 0555  WBC 21.4* 19.7*  HGB 8.6* 8.4*  HCT 27.7* 27.4*  PLT 263 258   BMET Recent Labs    05/19/24 0440 05/20/24 0555  NA 136 134*  K 3.3* 3.4*  CL 98 97*  CO2 25 25  GLUCOSE 147* 110*  BUN 22 28*  CREATININE 3.87* 4.54*  CALCIUM  8.1* 7.8*   PT/INR No results for input(s): LABPROT, INR in the last 72 hours. CMP     Component Value Date/Time   NA 134 (L) 05/20/2024 0555   NA 139 06/14/2014 0543   K 3.4 (L) 05/20/2024 0555   K 3.6 06/14/2014 0543   CL 97 (L) 05/20/2024 0555   CL 105 06/14/2014 0543   CO2 25 05/20/2024 0555   CO2 29 06/14/2014 0543   GLUCOSE 110 (H) 05/20/2024 0555   GLUCOSE 152 (H) 06/14/2014 0543   BUN 28 (H) 05/20/2024 0555   BUN 21 (H) 06/14/2014 0543   CREATININE 4.54 (H) 05/20/2024 0555   CREATININE 1.04 06/14/2014 0543   CALCIUM  7.8 (L) 05/20/2024 0555   CALCIUM  8.4 (L) 06/14/2014  0543   PROT 6.0 (L) 05/17/2024 0047   PROT 8.0 06/12/2014 2134   ALBUMIN 1.6 (L) 05/20/2024 0555   ALBUMIN 2.9 (L) 06/12/2014 2134   AST 13 (L) 05/17/2024 0047   AST 66 (H) 06/12/2014 2134   ALT 8 05/17/2024 0047   ALT 68 (H) 06/12/2014 2134   ALKPHOS 66 05/17/2024 0047   ALKPHOS 110 06/12/2014 2134   BILITOT 1.1 05/17/2024 0047   BILITOT 0.4 06/12/2014 2134   GFRNONAA 10 (L) 05/20/2024 0555   GFRNONAA 58 (L) 06/14/2014 0543   GFRNONAA >60 12/10/2013 0407   GFRAA 3 (L) 06/15/2020 0841   GFRAA >60 06/14/2014 0543   GFRAA >60 12/10/2013 0407   Lipase     Component Value Date/Time   LIPASE 22 05/16/2024 1308   LIPASE 535 (H) 12/11/2013 0405       Studies/Results: VAS US  UPPER EXTREMITY VENOUS DUPLEX Result Date: 05/19/2024 UPPER VENOUS STUDY  Patient Name:  PORSHA SKILTON  Date of Exam:   05/19/2024 Medical Rec #: [id number]         Accession #:    [id number] Date of Birth: [birth year]         Patient Gender: F Patient Age:   68 years Exam Location:  [facility] Procedure:      VAS US  UPPER EXTREMITY VENOUS DUPLEX Referring  Phys: TAYE GONFA --------------------------------------------------------------------------------  Indications: Left forearm swelling Risk Factors: Cancer cervical, on chemotherapy ESRD on dialysis. Limitations: Poor ultrasound/tissue interface, musculoskeletal features and Patient with dementia/non compliant with directions. Had to call staff to help with repositioning. Comparison Study: No prior study on file Performing Technologist: Alberta Lis RVS  Examination Guidelines: A complete evaluation includes B-mode imaging, spectral Doppler, color Doppler, and power Doppler as needed of all accessible portions of each vessel. Bilateral testing is considered an integral part of a complete examination. Limited examinations for reoccurring indications may be performed as noted.  Right Findings:  +----------+------------+---------+-----------+----------+---------------------+ RIGHT     CompressiblePhasicitySpontaneousProperties       Summary        +----------+------------+---------+-----------+----------+---------------------+ IJV         Partial                                         Acute         +----------+------------+---------+-----------+----------+---------------------+ Subclavian                                           Not well visualized                                                      secondary to port in                                                            chest and                                                              musculoskeletal                                                              features        +----------+------------+---------+-----------+----------+---------------------+  Left Findings: +----------+------------+---------+-----------+----------+--------------+ LEFT      CompressiblePhasicitySpontaneousProperties   Summary     +----------+------------+---------+-----------+----------+--------------+ IJV           Full       Yes       Yes                             +----------+------------+---------+-----------+----------+--------------+ Subclavian    Full       Yes       No                              +----------+------------+---------+-----------+----------+--------------+  Axillary                 Yes       Yes                             +----------+------------+---------+-----------+----------+--------------+ Radial        Full                                                 +----------+------------+---------+-----------+----------+--------------+ Ulnar         Full                                                 +----------+------------+---------+-----------+----------+--------------+ Cephalic                                            Not visualized  +----------+------------+---------+-----------+----------+--------------+ Basilic       Full                                                 +----------+------------+---------+-----------+----------+--------------+  Summary:  Right: Acute, non occlusive DVT in the right IJV.  Left: No evidence of deep vein thrombosis in the visualized veins of the left upper extremity. No evidence of superficial vein thrombosis in the visualized veins of the upper extremity. Significant subcutaneous edema throughout the left upper extremity. Acoustic shadowing throughout left upper arm. Limited visualization of dialysis access, but it appears patent.  *See table(s) above for measurements and observations.  Diagnosing physician: Gaile New MD Electronically signed by Gaile New MD on 05/19/2024 at 8:27:08 PM.    Final    DG Forearm Left Result Date: 05/19/2024 CLINICAL DATA:  Forearm swelling. EXAM: LEFT FOREARM - 2 VIEW COMPARISON:  None Available. FINDINGS: Proximal forearm edema. No underlying osseous erosion to suggest osteomyelitis. IMPRESSION: Proximal forearm swelling.  No underlying osseous abnormality. Electronically Signed   By: Newell Eke M.D.   On: 05/19/2024 13:33   DG Chest 2 View Result Date: 05/19/2024 CLINICAL DATA:  Pleural effusion, left arm swelling. EXAM: CHEST - 2 VIEW COMPARISON:  05/16/2024 and CT chest 07/15/2022. FINDINGS: Right IJ Port-A-Cath terminates in the SVC. Heart is enlarged, stable. Thoracic aorta is calcified. Lungs are low in volume with central pulmonary vascular congestion and a moderate layering left pleural effusion. Left basilar collapse/consolidation. Findings are similar to 05/16/2024. IMPRESSION: 1. Moderately layering with pleural effusion with left basilar collapse/consolidation, due to atelectasis or pneumonia. 2. Central pulmonary vascular congestion. Electronically Signed   By: Newell Eke M.D.   On: 05/19/2024 13:32    Anti-infectives: Anti-infectives  (From admission, onward)    Start     Dose/Rate Route Frequency Ordered Stop   05/17/24 0100  fidaxomicin  (DIFICID ) tablet 200 mg        200 mg Oral 2 times daily 05/17/24 0003 05/26/24 2159   05/17/24 0100  metroNIDAZOLE  (FLAGYL ) IVPB 500 mg  500 mg 100 mL/hr over 60 Minutes Intravenous Every 12 hours 05/17/24 0003          Assessment/Plan 68 yo female with C difficile colitis and imaging evidence of a colouterine fistula - Hypertensive. Afebrile. Hemodynamically stable. - WBC 19.7 from 21.4; HGB 8.4 from 8.6 (stable) - Minimal pain during exam. Having bowel function and tolerating diet. - No indication for acute surgical intervention for C diff colitis. 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 - Continue treatment for C diff and will prepare for outpatient follow up. General surgery will sign off. Please contact for further questions or concerns.  FEN: D3/Boost/IVF per nephrology/primary team VTE: Eliquis /ferrous sulfate  ID: Dificid / metronidazole     LOS: 4 days   I reviewed specialist notes, nursing notes, last 24 h vitals and pain scores, last 48 h intake and output, last 24 h labs and trends, and last 24 h imaging results.  This care required moderate level of medical decision making.    Marjorie Carlyon Favre, [facility] 05/20/2024, 8:50 AM Please see Amion for pager number during day hours 7:00am-4:30pm

## 2024-05-20 NOTE — Progress Notes (Signed)
Pt transported to IR via bed by transportation staff. 

## 2024-05-20 NOTE — Progress Notes (Signed)
Pt transported to HD via bed by transportation staff.  

## 2024-05-20 NOTE — Consult Note (Signed)
 [facility]    Date of Admission:  05/16/2024           Reason for Consult: persistent wbc    Principal Problem:   Colouterine fistula Active Problems:   Chronic recurrent pancreatitis (HCC)   ESRD (end stage renal disease) (HCC)   Type 2 diabetes mellitus with other specified complication (HCC)   Essential hypertension   Cervical cancer (HCC)   Dementia with behavioral disturbance (HCC)   C. difficile colitis   PVD (peripheral vascular disease) (HCC)   Fistula   Assessment: 68 year old female with history of dementia, ESRD on HD, prior history of cervical cancer, HTN, recent hospitalization for C. difficile colitis with Dificid  through 8/27 1010 days, antigen and PCR positive.  Presents with abdominal pain found to have #Persistent leukocytosis in setting of colitis, Colo uterine fistula, pleural effusion concern for mass -CT abdomen pelvis showed circumferential wall thickening of the rectosigmoid colon suspicious for colitis, Colo uterine fistula recommend surgical consultation, bilateral pleural effusions with area rounded density to be mass. -Radiology engaged per pleural fluid aspiration, too small for paracentesis - General Surgery engaged per [facility] uterine fistula, noted Colo uterine fistula can be managed electively Recommendations:  -I spoke with patient and she states that she was not actually having diarrhea but stool coming from her private parts. She states she did not have worsening diarrhea since discharge from previous hospitalization - Continue Dificid , discontinue metronidazole .  Okay to complete another 10 days of treatment patient history fluctuates - start augmentin  UTI given colo uterine-fistula, and possible PNA vs mass seen on  ct. will get dedicated ct chest. -Enteric precautions -Communicated to primary Microbiology:   Antibiotics: Dificid  9/4- IV metronidazole  9/4-  Cultures: Blood 9/4 no  growth Urine  Other   HPI: Tracy Jennings is a 68 y.o. female with history of dementia, ESRD on HD, prior history of cervical cancer, hypertension, anemia, hyperlipidemia, TIA recently diagnosed with C. difficile colitis discharged hospital on 05/05/24 with Dificid  through 8/27.  Presents with abdominal pain.  Patient is a poor historian.CT abdomen pelvis showed circumferential wall thickening of the rectosigmoid colon suspicious for colitis, Colo uterine fistula recommend surgical consultation, bilateral pleural effusions with area rounded density to be mass. Patient was started on Dificid  and metronidazole .  Due to persistent leukocytosis ID engaged.  Review of Systems: Review of Systems  All other systems reviewed and are negative.   Past Medical History:  Diagnosis Date   Anemia 04/2018   low iron. to be started on supplements   Cervical cancer (HCC)    CKD (chronic kidney disease)    Stage IV   Complication of anesthesia    receceived too much anesthesia, that she was in coma for a couple days    COVID-19 virus detected 10/28/2019   Diabetes mellitus without complication (HCC)    type II   ESRD (end stage renal disease) (HCC)    Heart murmur    followed as a child only   HSIL (high grade squamous intraepithelial lesion) on Pap smear of cervix    Hyperlipidemia associated with type 2 diabetes mellitus (HCC)    Hypertension    Pancreatitis    Peripheral vascular disease (HCC)     Social History   Tobacco Use   Smoking status: Never   Smokeless tobacco: Never  Vaping Use   Vaping status: Never Used  Substance Use Topics   Alcohol use: No  Drug use: Never    Family History  Problem Relation Age of Onset   Stroke Mother    Hypertension Mother    Gout Mother    Cancer Father    Diabetes Sister    Diabetes Maternal Grandmother    Diabetes Maternal Grandfather    Diabetes Paternal Grandmother    Diabetes Paternal Grandfather    Scheduled Meds:  apixaban   10  mg Oral BID   Followed by   [name] ON 05/26/2024] apixaban   5 mg Oral BID   atorvastatin   40 mg Oral Daily   carvedilol   25 mg Oral BID WC   Chlorhexidine  Gluconate Cloth  6 each Topical Q0600   cloNIDine   0.3 mg Transdermal Weekly   diltiazem   360 mg Oral Daily   feeding supplement  1 Container Oral TID BM   ferrous sulfate   325 mg Oral Daily   fidaxomicin   200 mg Oral BID   losartan   100 mg Oral Daily   mirtazapine   7.5 mg Oral QHS   pantoprazole   40 mg Oral Daily   sodium chloride  flush  10-40 mL Intracatheter Q12H   spironolactone   25 mg Oral Daily   Continuous Infusions:  metronidazole  500 mg (05/20/24 0053)   PRN Meds:.acetaminophen  **OR** acetaminophen , hydrALAZINE , mouth rinse Allergies  Allergen Reactions   Amlodipine  Swelling    Knees down to ankles Knees down to ankles   Hctz [Hydrochlorothiazide]     pancreatitis   Heparin  Other (See Comments)    Hx  HIT,   Pt reports cardiac arrest when given heparin    Lmw Heparin      Reports cardiac arrest when given heparin    Other Other (See Comments)    Reports cardiac arrest when given during surgery Reports cardiac arrest when given during surgery Reports cardiac arrest when given during surgery    Sulfa Antibiotics Rash   Lactose    Dairy Aid [Tilactase]     Runny nose   Hydralazine      Nausea and rash    OBJECTIVE: Blood pressure (!) 189/51, pulse 71, temperature 98.3 F (36.8 C), temperature source Oral, resp. rate 16, height 5' 3 (1.6 m), weight 58.6 kg, SpO2 99%.  Physical Exam Constitutional:      Appearance: Normal appearance.  HENT:     Head: Normocephalic and atraumatic.     Right Ear: Tympanic membrane normal.     Left Ear: Tympanic membrane normal.     Nose: Nose normal.     Mouth/Throat:     Mouth: Mucous membranes are moist.  Eyes:     Extraocular Movements: Extraocular movements intact.     Conjunctiva/sclera: Conjunctivae normal.     Pupils: Pupils are equal, round, and reactive to light.   Cardiovascular:     Rate and Rhythm: Normal rate and regular rhythm.     Heart sounds: No murmur heard.    No friction rub. No gallop.  Pulmonary:     Effort: Pulmonary effort is normal.     Breath sounds: Normal breath sounds.  Abdominal:     General: Abdomen is flat.     Palpations: Abdomen is soft.  Musculoskeletal:        General: Normal range of motion.  Skin:    General: Skin is warm and dry.  Neurological:     General: No focal deficit present.     Mental Status: She is alert and oriented to person, place, and time.  Psychiatric:        Mood  and Affect: Mood normal.     Lab Results Lab Results  Component Value Date   WBC 19.7 (H) 05/20/2024   HGB 8.4 (L) 05/20/2024   HCT 27.4 (L) 05/20/2024   MCV 95.8 05/20/2024   PLT 258 05/20/2024    Lab Results  Component Value Date   CREATININE 4.54 (H) 05/20/2024   BUN 28 (H) 05/20/2024   NA 134 (L) 05/20/2024   K 3.4 (L) 05/20/2024   CL 97 (L) 05/20/2024   CO2 25 05/20/2024    Lab Results  Component Value Date   ALT 8 05/17/2024   AST 13 (L) 05/17/2024   ALKPHOS 66 05/17/2024   BILITOT 1.1 05/17/2024       Loney Stank, MD [facility] 05/20/2024, 11:57 AM

## 2024-05-20 NOTE — Progress Notes (Signed)
 Nutrition Follow-up  DOCUMENTATION CODES:   Severe malnutrition in context of chronic illness  INTERVENTION:  Encourage PO intake - Currently on Dysphagia 3, thin liquids  Nursing to assist with meals  Boost Breeze po TID, each supplement provides 250 kcal and 9 grams of protein Prosource Plus, each packet provides 100 kcal and 15 gm protein  Rena-Vite daily  100 mg Thiamine  daily   If enteral nutrition is within GOC, recommend cortrak placement and regimen of: Osmolite 1.5 at 20 ml/h and increase by 10 ml every 8 hours until goal of 45 ml/hr (1080 ml per day) Prosource TF20 60 ml daily Banatrol BID per tube   Provides 1800 kcal, 86 gm protein, 823 ml free water  daily  NUTRITION DIAGNOSIS:   Severe Malnutrition related to chronic illness as evidenced by severe muscle depletion, severe fat depletion, meal completion < 25%, per patient/family report, energy intake < or equal to 75% for > or equal to 1 month. - New Dx  GOAL:   Patient will meet greater than or equal to 90% of their needs - Progressing   MONITOR:   PO intake, Supplement acceptance, Labs, I & O's  REASON FOR ASSESSMENT:   Consult Assessment of nutrition requirement/status  ASSESSMENT:  Pt with PMH of dementia, ESRD on HD MWF, cervical ca, HTN, HLD, anemia, recent hospitalizations from 6/24 - 7/22 for hypertensive urgency and delirium, and 8/17 - 8/24 for Cdiff colitis now admitted from home with abd discomfort, weakness, Nausea, and diarrhea.  9/4 - CT of abd and pelvis showed rectosigmoid colon suspicious for colitis, colo-uterine fistula, increased bilateral pleural effusions, and progressive anasarca. Surgery, Dasie, consulted and recommends continued treatment for cdiff and outpt follow up and ok for diet advancement.  9/8 - Left pleural effusion too small for Thoracocentesis, concern for malignancy. HD today   Pt lying curled in bed with eyes closed, brother at bedside. He reports pt has gone downhill  since being hospitalized in June of this year. Prior to recent hospitalizations pt was doing well, living alone, eating well, and had a good appetite. Since July son reports pt has not been eating and only taking bites of meals. He has seen a significant decline with her mobility and weight loss. She used to walk with a walker but now he reports she has been mostly bed bound. He is unsure of her UBW/EDW. Renal following, pt receives her HD at [facility] with EDW of 53 kg. Last HD was 9/5. Pt with current weight of 58.6 kg. Suspect this is fluid related as pt has severe muscle and fat loss.   Since current admission pt has not been eating well and continues to only eat bites of meals. Son reports she had a couple bites of eggs and sausage and meatloaf yesterday. Not drinking a lot of fluids. Today pt did not have any breakfast. Not drinking Boost Breeze. Son reports part of the reason why pt has not been eating is due to her ongoing BM which has been going on since July. Has not tolerated Ensure or Nepro d/t diarrhea in the past and per last RD note pt was willing to try Boost Breeze. Pt continues to have loose bowel movements.   Pt meets criteria for severe malnutrition, spoke to brother about need for cortrak feeding tube placement, he is agreeable however pt is not. Discussed with MD and Palliative acre. Pt is not able to make decisions for herself. Family would like full care/code status.Will need sons  consent in order for cortrak to be placed. Pt to be in HD later in the afternoon.Possible for cortrak on Wednesday if pt still admitted and if son consents. MD and Palliative aware.   Disposition: Family is hoping to take pt home   Admit weight: 57 kg -Fluid? Current weight: 58.6 kg - Fluids?  EDW: 53 kg  Average Meal Intake: 9/5-9/6: 8% intake x 3 recorded meals  Nutritionally Relevant Medications: Scheduled Meds:  (feeding supplement) PROSource Plus  30 mL Oral BID BM   feeding  supplement  1 Container Oral TID BM   ferrous sulfate   325 mg Oral Daily   mirtazapine   7.5 mg Oral QHS   multivitamin  1 tablet Oral QHS   thiamine   100 mg Oral Daily   Continuous Infusions:  metronidazole  500 mg (05/20/24 1210)   Labs Reviewed: Sodium 134 Potassium 3.4 BUN 28 Creatinine 4.54 Calcium  7.8 GFR 10 CBG ranges from 90-125 mg/dL over the last 24 hours HgbA1c 5.1  NUTRITION - FOCUSED PHYSICAL EXAM:  Flowsheet Row Most Recent Value  Orbital Region Unable to assess  [Fluid]  Upper Arm Region Severe depletion  Thoracic and Lumbar Region Severe depletion  [Fluid]  Buccal Region Unable to assess  Temple Region Moderate depletion  Clavicle Bone Region Severe depletion  Clavicle and Acromion Bone Region Severe depletion  Scapular Bone Region Severe depletion  Dorsal Hand Severe depletion  Patellar Region Severe depletion  Anterior Thigh Region Severe depletion  Posterior Calf Region Severe depletion  Edema (RD Assessment) Moderate  Hair Reviewed  Eyes Unable to assess  Mouth Unable to assess  Skin Reviewed  Nails Reviewed    Diet Order:   Diet Order             DIET DYS 3 Room service appropriate? Yes; Fluid consistency: Thin  Diet effective now                   EDUCATION NEEDS:   Not appropriate for education at this time  Skin:  Skin Assessment: Reviewed RN Assessment  Last BM:  9/4 type 6; medium  Height:   Ht Readings from Last 1 Encounters:  05/16/24 5' 3 (1.6 m)    Weight:   Wt Readings from Last 1 Encounters:  05/20/24 58.3 kg    BMI:  Body mass index is 22.77 kg/m.  Estimated Nutritional Needs:   Kcal:  1600-1900 kcal  Protein:  80-100 gm  Fluid:  1 L + UOP   Tracy Jennings, RD Registered Dietitian  See Amion for more information

## 2024-05-20 NOTE — Progress Notes (Signed)
 [facility] Progress Note   Subjective:    Seen and examined patient at bedside. Brother is bedside this AM.  Pt denies any specific complaint this AM.  Palliative consulted. Next HD today  Objective Vitals:   05/19/24 2000 05/20/24 0500 05/20/24 0810 05/20/24 0830  BP: (!) 164/50 (!) 168/55 (!) 189/51 (!) 189/51  Pulse: 70 70 71   Resp:      Temp: (!) 97.5 F (36.4 C) 97.7 F (36.5 C) 98.3 F (36.8 C)   TempSrc: Oral Oral Oral   SpO2: 100% 98% 99%   Weight:      Height:       Physical Exam General: Awake, chronically ill-appearing; thin, on RA, NAD Heart: S1 and S2; No murmurs, gallops, rubs Lungs: Clear anteriorly Abdomen:Soft and non-tender Extremities: No LE edema Dialysis Access: L AVG +t/b, she declined for me to removethe dressing from last HD today  [facility] Weights   05/17/24 1938  Weight: 58.6 kg   No intake or output data in the 24 hours ending 05/20/24 0932   Additional Objective Labs: Basic Metabolic Panel: Recent Labs  Lab 05/18/24 0305 05/19/24 0440 05/20/24 0555  NA 136 136 134*  K 3.5 3.3* 3.4*  CL 98 98 97*  CO2 25 25 25   GLUCOSE 116* 147* 110*  BUN 12 22 28*  CREATININE 2.99* 3.87* 4.54*  CALCIUM  8.0* 8.1* 7.8*  PHOS 2.2* 3.1 3.4   Liver Function Tests: Recent Labs  Lab 05/16/24 1308 05/17/24 0047 05/18/24 0305 05/19/24 0440 05/20/24 0555  AST 12* 13*  --   --   --   ALT 7 8  --   --   --   ALKPHOS 55 66  --   --   --   BILITOT 1.3* 1.1  --   --   --   PROT 5.8* 6.0*  --   --   --   ALBUMIN 2.0* 1.9* 1.7* 1.6* 1.6*   Recent Labs  Lab 05/16/24 1308  LIPASE 22   CBC: Recent Labs  Lab 05/16/24 1308 05/17/24 0047 05/18/24 0305 05/19/24 0440 05/20/24 0555  WBC 21.7* 21.7* 20.5* 21.4* 19.7*  NEUTROABS 20.2* 21.3* 19.5* 20.8* 18.5*  HGB 8.3* 9.8* 8.1* 8.6* 8.4*  HCT 26.6* 31.7* 26.1* 27.7* 27.4*  MCV 97.4 97.8 96.3 96.2 95.8  PLT 252 266 258 263 258   Blood Culture    Component Value Date/Time   SDES  BLOOD BLOOD RIGHT HAND 05/16/2024 1643   SPECREQUEST  05/16/2024 1643    BOTTLES DRAWN AEROBIC AND ANAEROBIC BOTTLES DRAWN AEROBIC ONLY   CULT  05/16/2024 1643    NO GROWTH 4 DAYS Performed at [facility], [address] [place], [place] [address]    REPTSTATUS PENDING 05/16/2024 1643    Cardiac Enzymes: No results for input(s): CKTOTAL, CKMB, CKMBINDEX, TROPONINI in the last 168 hours. CBG: Recent Labs  Lab 05/19/24 0809 05/19/24 1210 05/19/24 1721 05/19/24 2231 05/20/24 0808  GLUCAP 122* 125* 114* 101* 99   Iron Studies: No results for input(s): IRON, TIBC, TRANSFERRIN, FERRITIN in the last 72 hours. Lab Results  Component Value Date   INR 1.3 (H) 03/05/2024   INR 1.1 11/03/2019   INR 1.04 05/12/2018   Studies/Results: VAS US  UPPER EXTREMITY VENOUS DUPLEX Result Date: 05/19/2024 UPPER VENOUS STUDY  Patient Name:  JULIANNAH OHMANN  Date of Exam:   05/19/2024 Medical Rec #: [id number]         Accession #:  [id number] Date of Birth: 1956-09-08         Patient Gender: F Patient Age:   68 years Exam [facility]:  [facility] Procedure:      VAS US  UPPER EXTREMITY VENOUS DUPLEX Referring Phys: MIGNON GONFA --------------------------------------------------------------------------------  Indications: Left forearm swelling Risk Factors: Cancer cervical, on chemotherapy ESRD on dialysis. Limitations: Poor ultrasound/tissue interface, musculoskeletal features and Patient with dementia/non compliant with directions. Had to call staff to help with repositioning. Comparison Study: No prior study on file Performing Technologist: Alberta Lis RVS  Examination Guidelines: A complete evaluation includes B-mode imaging, spectral Doppler, color Doppler, and power Doppler as needed of all accessible portions of each vessel. Bilateral testing is considered an integral part of a complete examination. Limited examinations for reoccurring indications may be performed as noted.   Right Findings: +----------+------------+---------+-----------+----------+---------------------+ RIGHT     CompressiblePhasicitySpontaneousProperties       Summary        +----------+------------+---------+-----------+----------+---------------------+ IJV         Partial                                         Acute         +----------+------------+---------+-----------+----------+---------------------+ Subclavian                                           Not well visualized                                                      secondary to port in                                                            chest and                                                              musculoskeletal                                                              features        +----------+------------+---------+-----------+----------+---------------------+  Left Findings: +----------+------------+---------+-----------+----------+--------------+ LEFT      CompressiblePhasicitySpontaneousProperties   Summary     +----------+------------+---------+-----------+----------+--------------+ IJV           Full       Yes       Yes                             +----------+------------+---------+-----------+----------+--------------+ Subclavian    Full  Yes       No                              +----------+------------+---------+-----------+----------+--------------+ Axillary                 Yes       Yes                             +----------+------------+---------+-----------+----------+--------------+ Radial        Full                                                 +----------+------------+---------+-----------+----------+--------------+ Ulnar         Full                                                 +----------+------------+---------+-----------+----------+--------------+ Cephalic                                            Not  visualized +----------+------------+---------+-----------+----------+--------------+ Basilic       Full                                                 +----------+------------+---------+-----------+----------+--------------+  Summary:  Right: Acute, non occlusive DVT in the right IJV.  Left: No evidence of deep vein thrombosis in the visualized veins of the left upper extremity. No evidence of superficial vein thrombosis in the visualized veins of the upper extremity. Significant subcutaneous edema throughout the left upper extremity. Acoustic shadowing throughout left upper arm. Limited visualization of dialysis access, but it appears patent.  *See table(s) above for measurements and observations.  Diagnosing physician: Gaile New MD Electronically signed by Gaile New MD on 05/19/2024 at 8:27:08 PM.    Final    DG Forearm Left Result Date: 05/19/2024 CLINICAL DATA:  Forearm swelling. EXAM: LEFT FOREARM - 2 VIEW COMPARISON:  None Available. FINDINGS: Proximal forearm edema. No underlying osseous erosion to suggest osteomyelitis. IMPRESSION: Proximal forearm swelling.  No underlying osseous abnormality. Electronically Signed   By: Newell Eke M.D.   On: 05/19/2024 13:33   DG Chest 2 View Result Date: 05/19/2024 CLINICAL DATA:  Pleural effusion, left arm swelling. EXAM: CHEST - 2 VIEW COMPARISON:  05/16/2024 and CT chest 07/15/2022. FINDINGS: Right IJ Port-A-Cath terminates in the SVC. Heart is enlarged, stable. Thoracic aorta is calcified. Lungs are low in volume with central pulmonary vascular congestion and a moderate layering left pleural effusion. Left basilar collapse/consolidation. Findings are similar to 05/16/2024. IMPRESSION: 1. Moderately layering with pleural effusion with left basilar collapse/consolidation, due to atelectasis or pneumonia. 2. Central pulmonary vascular congestion. Electronically Signed   By: Newell Eke M.D.   On: 05/19/2024 13:32    Medications:   metronidazole  500 mg (05/20/24 0053)    apixaban   10 mg Oral BID   Followed by   [name] ON  05/26/2024] apixaban   5 mg Oral BID   atorvastatin   40 mg Oral Daily   carvedilol   25 mg Oral BID WC   Chlorhexidine  Gluconate Cloth  6 each Topical Q0600   cloNIDine   0.3 mg Transdermal Weekly   diltiazem   360 mg Oral Daily   feeding supplement  1 Container Oral TID BM   ferrous sulfate   325 mg Oral Daily   fidaxomicin   200 mg Oral BID   losartan   100 mg Oral Daily   mirtazapine   7.5 mg Oral QHS   pantoprazole   40 mg Oral Daily   sodium chloride  flush  10-40 mL Intracatheter Q12H   spironolactone   25 mg Oral Daily    Dialysis Orders: MWF [facility]  3.5hrs BFR 400 DFR 500 EDW 53kg 2K/2.5Ca Heparin  bolus 600 units with HD then 400 units/hr Calcitriol  1.25mcg with HD - last dose 9/3 Sensipar 60mg  with HD - last dose 9/3 Micera 75mcg Q 4 weeks - per records, dose just ordered Calcium  Acetate 667mh TID with meals   Assessment/Plan: Colo-uterine fistula: per abd CT. 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  No issues with AVG thus far. Monitor. ESRD: on HD MWF. Next HD 9/8. HTN: HTN on 5 BP agents. Noted allergy to Amlodipine .  Had hypotension w HD 9/5 - BP appears labile, will titrate/add meds as needed if she remains high.  Volume: euvolemic on exam, no edema, CXR no edema. Didn't reach max UF 9/5 2nd hypotension. Not eating much, limited UF with HD today. 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 Holding binders, VDRA, and Sensipar for now.  GOC: Palliative consulted  Manuelita Barters MD [facility] Pager [phone]

## 2024-05-21 ENCOUNTER — Telehealth (HOSPITAL_COMMUNITY): Payer: Self-pay | Admitting: Pharmacy Technician

## 2024-05-21 ENCOUNTER — Other Ambulatory Visit (HOSPITAL_COMMUNITY): Payer: Self-pay

## 2024-05-21 DIAGNOSIS — C539 Malignant neoplasm of cervix uteri, unspecified: Secondary | ICD-10-CM | POA: Diagnosis not present

## 2024-05-21 DIAGNOSIS — K529 Noninfective gastroenteritis and colitis, unspecified: Secondary | ICD-10-CM | POA: Diagnosis not present

## 2024-05-21 DIAGNOSIS — A0472 Enterocolitis due to Clostridium difficile, not specified as recurrent: Secondary | ICD-10-CM | POA: Diagnosis not present

## 2024-05-21 DIAGNOSIS — N823 Fistula of vagina to large intestine: Secondary | ICD-10-CM | POA: Diagnosis not present

## 2024-05-21 DIAGNOSIS — D72829 Elevated white blood cell count, unspecified: Secondary | ICD-10-CM | POA: Diagnosis not present

## 2024-05-21 DIAGNOSIS — J90 Pleural effusion, not elsewhere classified: Secondary | ICD-10-CM | POA: Diagnosis not present

## 2024-05-21 DIAGNOSIS — N824 Other female intestinal-genital tract fistulae: Secondary | ICD-10-CM | POA: Diagnosis not present

## 2024-05-21 DIAGNOSIS — F03918 Unspecified dementia, unspecified severity, with other behavioral disturbance: Secondary | ICD-10-CM | POA: Diagnosis not present

## 2024-05-21 LAB — GLUCOSE, CAPILLARY
Glucose-Capillary: 110 mg/dL — ABNORMAL HIGH (ref 70–99)
Glucose-Capillary: 125 mg/dL — ABNORMAL HIGH (ref 70–99)
Glucose-Capillary: 150 mg/dL — ABNORMAL HIGH (ref 70–99)
Glucose-Capillary: 135 mg/dL — ABNORMAL HIGH (ref 70–99)

## 2024-05-21 LAB — RENAL FUNCTION PANEL
Albumin: 1.6 g/dL — ABNORMAL LOW (ref 3.5–5.0)
Anion gap: 13 (ref 5–15)
BUN: 19 mg/dL (ref 8–23)
CO2: 27 mmol/L (ref 22–32)
Calcium: 7.7 mg/dL — ABNORMAL LOW (ref 8.9–10.3)
Chloride: 97 mmol/L — ABNORMAL LOW (ref 98–111)
Creatinine, Ser: 3.27 mg/dL — ABNORMAL HIGH (ref 0.44–1.00)
GFR, Estimated: 15 mL/min — ABNORMAL LOW (ref >60)
Glucose, Bld: 139 mg/dL — ABNORMAL HIGH (ref 70–99)
Phosphorus: 2.7 mg/dL (ref 2.5–4.6)
Potassium: 3.5 mmol/L (ref 3.5–5.1)
Sodium: 137 mmol/L (ref 135–145)

## 2024-05-21 LAB — MAGNESIUM: Magnesium: 1.7 mg/dL (ref 1.7–2.4)

## 2024-05-21 LAB — CBC
HCT: 25 % — ABNORMAL LOW (ref 36.0–46.0)
Hemoglobin: 7.6 g/dL — ABNORMAL LOW (ref 12.0–15.0)
MCH: 29.7 pg (ref 26.0–34.0)
MCHC: 30.4 g/dL (ref 30.0–36.0)
MCV: 97.7 fL (ref 80.0–100.0)
Platelets: 227 K/uL (ref 150–400)
RBC: 2.56 MIL/uL — ABNORMAL LOW (ref 3.87–5.11)
RDW: 16.4 % — ABNORMAL HIGH (ref 11.5–15.5)
WBC: 21.7 K/uL — ABNORMAL HIGH (ref 4.0–10.5)
nRBC: 0 % (ref 0.0–0.2)

## 2024-05-21 MED ORDER — VANCOMYCIN HCL 125 MG PO CAPS
125.0000 mg | ORAL_CAPSULE | Freq: Every day | ORAL | Status: AC
  Administered 2024-05-27 – 2024-06-02 (×7): 125 mg via ORAL
  Filled 2024-05-21 (×8): qty 1

## 2024-05-21 MED ORDER — BOOST / RESOURCE BREEZE PO LIQD CUSTOM
1.0000 | Freq: Two times a day (BID) | ORAL | Status: DC
  Administered 2024-05-22 – 2024-05-29 (×8): 1 via ORAL

## 2024-05-21 MED ORDER — BOOST PLUS PO LIQD
237.0000 mL | Freq: Every day | ORAL | Status: DC
  Administered 2024-05-21 – 2024-05-28 (×6): 237 mL via ORAL
  Filled 2024-05-21 (×9): qty 237

## 2024-05-21 NOTE — Progress Notes (Addendum)
 Nutrition Brief Note  Pt lying curled up with eyes closed. Pt still only eating bites of food. Brother, sister, and family friend at bedside. They report pt chews solid food but is unable to swallow. Able to swallow liquids fine. Had 1 cup of cream of chicken soup and a couple sips of juice this morning. Not drinking supplements. Will downgrade diet to dysphagia 2.   RD attempted to call pt's son for permission for cortrak feeding tube tomorrow, call went straight to voicemail. Discussed with MD, ok to place cortrak feeding tube with brother and sister's permission. Both agreed. Plan for cortrak tomorrow.   INTERVENTION:  Encourage PO intake  Downgrade diet to Dysphagia 2, thin liquids to help with swallowing  Nursing to assist with meals  Boost Breeze po BID, each supplement provides 250 kcal and 9 grams of protein Boost Plus daily , Each supplement provides 360 kcal and 14 gm protein Prosource Plus BID, each packet provides 100 kcal and 15 gm protein  Rena-Vite daily  100 mg Thiamine  daily    If enteral nutrition is within GOC, recommend cortrak placement and regimen of: Osmolite 1.5 at 20 ml/h and increase by 10 ml every 8 hours until goal of 45 ml/hr (1080 ml per day) Prosource TF20 60 ml daily Banatrol BID per tube    Provides 1800 kcal, 86 gm protein, 823 ml free water  daily  *Pt may need PEG tube if PO intake does not increase   NUTRITION DIAGNOSIS:    Severe Malnutrition related to chronic illness as evidenced by severe muscle depletion, severe fat depletion, meal completion < 25%, per patient/family report, energy intake < or equal to 75% for > or equal to 1 month. - New Dx   GOAL:    Patient will meet greater than or equal to 90% of their needs - Progressing    MONITOR:    PO intake, Supplement acceptance, Labs, I & O's  Olivia Kenning, RD Registered Dietitian  See Amion for more information

## 2024-05-21 NOTE — Progress Notes (Signed)
Pt given and taught how to use incentive spirometer.

## 2024-05-21 NOTE — Progress Notes (Signed)
 PROGRESS NOTE  Tracy Jennings FMW:[id number] DOB: March 23, 1956   PCP: George, Sionne A, MD  Patient is from: Home.  Lives alone.  Uses rolling walker at baseline.  Was able to take care of her ADLs including cooking until her recent hospitalization at [facility] in July.   DOA: 05/16/2024 LOS: 5  Chief complaints No chief complaint on file.    Brief Narrative / Interim history: 68 year old F with PMH of cognitive impairment, ESRD on HD MWF, cervical cancer, HTN, HLD, anemia and recent hospitalizations from 6/24-7/22 for hypertensive urgency and delirium, and 8/17-8/24 for C. difficile colitis for which she was treated with p.o. vancomycin  brought back to [facility] from PCP office due to weakness, nausea, diarrhea and abdominal discomfort.  In ED, WBC 21.7.  Hgb 8.3.  Troponin 37.  CT abdomen and pelvis showed circumferential wall thickening of rectosigmoid colon suspicious for colitis, Colo uterine fistula, increased bilateral pleural effusions, left > right and area of rounded density in anterior LLL measuring 3.1 cm, and progressive anasarca.  General surgery at [facility] was consulted and recommended higher level of care and the ER physician discussed with Dr. Dasie general surgeon at [facility] who agreed to see patient in consult.   Patient was started on Dificid  and Flagyl  for C. difficile colitis.  Evaluated by general surgery, and they recommended elective management, and treating for C. difficile.  ID consulted for persistent leukocytosis and started Augmentin  and ordered CT chest that showed small slightly loculated appearing left pleural effusion, subpleural rounded consolidation at lingula, ovoid masslike consolidation at the anterior left lung base contagious with curvilinear consolidation posteriorly suggesting atelectasis, patchy areas of mild bilateral ground glass density and anasarca.  Poor p.o. intake.  Dietitian recommended tube feed and planning to place cortrak on 9/10 unless her appetite  magically improves.  Subjective: Seen and examined earlier this morning.  No major events overnight or this morning.  She has loose stool earlier this morning.  No complaints.  Denies pain, cough or shortness of breath.  She says she is hungry this morning.  Objective: Vitals:   05/20/24 2211 05/21/24 0412 05/21/24 0829 05/21/24 0835  BP: (!) 124/54 132/68 (!) 149/48 129/62  Pulse: 65 62    Resp: 15 18    Temp: 97.9 F (36.6 C) 97.8 F (36.6 C) 98.2 F (36.8 C)   TempSrc: Oral Oral Oral   SpO2: 100% 100%    Weight:      Height:        Examination:  GENERAL: Appears frail.  No apparent distress. HEENT: MMM.  Vision and hearing grossly intact.  NECK: Supple.  No apparent JVD.  RESP:  No IWOB.  Fair aeration bilaterally. CVS:  RRR. Heart sounds normal.  ABD/GI/GU: BS+. Abd soft, NTND.  MSK/EXT:  Moves extremities.  Significant muscle mass and subcu fat loss.  Left forearm swelling improved. SKIN: no apparent skin lesion or wound NEURO: Sleepy but wakes to voice.  Oriented to self and family.   Follows commands.  No apparent focal neuro deficit. PSYCH: Calm. Normal affect.   Consultants:  General Surgery Infectious disease Interventional radiology   Procedures: None  Microbiology summarized: 9/4-single set blood culture at [facility].  Assessment and plan: C. difficile colitis: Completed 10 days of oral vancomycin  about a week ago.  Ongoing abdominal pain and diarrhea.  CT suggested rectosigmoid colitis.  She has loose stool but not frequently.  Abdominal exam benign.  Still with significant leukocytosis - Continue Dificid  - Continue  soft diet.  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  Abdominal exam benign. - General Surgery recommends elective management and treating C. difficile. - Continue soft diet.  Persistent leukocytosis/bandemia: Persisted despite treatment of C. difficile.  Has no respiratory symptoms but chronic left pleural effusion which  was felt to be small to tap.  Left forearm swelling but no signs of infection.  Blood culture negative on 9/4. Incidental right IJ acute DVT.  CT showed small slightly loculated appearing left pleural effusion and masslike consolidations as above. -Appreciate help by ID-started p.o. Augmentin  -Encourage incentive telemetry -Needs repeat CT in 4 to 6 weeks for LLL mass.  Loculated left pleural effusion/masslike consolidation: Seems chronic.  Has no respiratory symptoms.  IR consulted for thoracocentesis but pleural effusion felt to be small to tap safely. CT chest finding as above.   - Now on p.o. Augmentin  per ID - Encourage incentive telemetry - Needs repeat CT in 4 to 6 weeks  Dementia without behavioral disturbance: Oriented to self and family. Failure to thrive/poor appetite/unintentional weight loss: family reports significant weight loss since her hospitalization in July 2025.  Not able to quantify.  Appetite remains poor even after addition of low-dose Remeron .  Dietitian recommends tube feed -Continue low-dose Remeron  -Appreciate input by dietitian-recommends cortrack and tube feed. -Appreciate input by palliative-full code with full scope of care per family  Uncontrolled hypertension: Improved  -Continue home clonidine  patch, losartan , Coreg , hydralazine  and Aldactone  -Cardizem  increased from 180 mg daily to 360 mg daily on 9/5 -Ultrafiltration per nephrology. -Continue monitoring  Acute right IJ DVT: Incidental finding.  Venous Doppler was ordered for left forearm swelling but showed acute right IJ DVT. - Started on Eliquis   ESRD on HD MWF-last HD on Wednesday.  No emergent need. - Per nephrology.  Anemia of renal disease: H&H relatively stable.  No overt bleeding such as melena or hematochezia. Recent Labs    04/29/24 0511 04/30/24 0429 05/02/24 0922 05/03/24 0801 05/04/24 0338 05/16/24 1308 05/17/24 0047 05/18/24 0305 05/19/24 0440 05/20/24 0555  HGB 7.6* 7.5*  10.1* 9.2* 9.9* 8.3* 9.8* 8.1* 8.6* 8.4*  - Continue monitoring  Physical deconditioning - Therapy recommended SNF.  Seems family is in agreement now  History of uterine cancer: - Outpatient follow-up.  Hypokalemia -Monitor replenish K and Mg as appropriate  Severe malnutrition/failure to thrive/hypoalbuminemia Body mass index is 22.77 kg/m. Nutrition Problem: Severe Malnutrition Etiology: chronic illness Signs/Symptoms: severe muscle depletion, severe fat depletion, meal completion < 25%, per patient/family report, energy intake < or equal to 75% for > or equal to 1 month Interventions: Refer to RD note for recommendations   DVT prophylaxis:  Place and maintain sequential compression device Start: 05/17/24 1416 SCDs Start: 05/17/24 0000 apixaban  (ELIQUIS ) tablet 10 mg  apixaban  (ELIQUIS ) tablet 5 mg  Code Status: Full code Family Communication: Updated patient's brother and sister at bedside. Level of care: Telemetry Medical Status is: Inpatient Remains inpatient appropriate because: C. difficile colitis, Colouterine fistula and persistent leukocytosis   Final disposition: Likely home with home health in the next 24 to 48 hours  55 minutes with more than 50% spent in reviewing records, counseling patient/family and coordinating care.   Sch Meds:  Scheduled Meds:  (feeding supplement) PROSource Plus  30 mL Oral BID BM   amoxicillin -clavulanate  1 tablet Oral Q12H   apixaban   10 mg Oral BID   Followed by   [name] ON 05/26/2024] apixaban   5 mg Oral BID   atorvastatin   40 mg Oral Daily   carvedilol   25 mg Oral BID WC   Chlorhexidine  Gluconate Cloth  6 each Topical Q0600   cloNIDine   0.3 mg Transdermal Weekly   diltiazem   360 mg Oral Daily   feeding supplement  1 Container Oral TID BM   ferrous sulfate   325 mg Oral Daily   fidaxomicin   200 mg Oral BID   losartan   100 mg Oral Daily   mirtazapine   7.5 mg Oral QHS   multivitamin  1 tablet Oral QHS   pantoprazole   40 mg  Oral Daily   sodium chloride  flush  10-40 mL Intracatheter Q12H   spironolactone   25 mg Oral Daily   thiamine   100 mg Oral Daily   [START ON 05/27/2024] vancomycin   125 mg Oral Daily   Continuous Infusions:   PRN Meds:.acetaminophen  **OR** acetaminophen , hydrALAZINE , mouth rinse  Antimicrobials: Anti-infectives (From admission, onward)    Start     Dose/Rate Route Frequency Ordered Stop   05/27/24 1000  vancomycin  (VANCOCIN ) capsule 125 mg        125 mg Oral Daily 05/21/24 1019 06/04/24 0959   05/20/24 1530  amoxicillin -clavulanate (AUGMENTIN ) 500-125 MG per tablet 1 tablet        1 tablet Oral Every 12 hours 05/20/24 1519 05/30/24 0959   05/17/24 0100  fidaxomicin  (DIFICID ) tablet 200 mg        200 mg Oral 2 times daily 05/17/24 0003 05/26/24 2159   05/17/24 0100  metroNIDAZOLE  (FLAGYL ) IVPB 500 mg  Status:  Discontinued        500 mg 100 mL/hr over 60 Minutes Intravenous Every 12 hours 05/17/24 0003 05/20/24 1519        I have personally reviewed the following labs and images: CBC: Recent Labs  Lab 05/16/24 1308 05/17/24 0047 05/18/24 0305 05/19/24 0440 05/20/24 0555  WBC 21.7* 21.7* 20.5* 21.4* 19.7*  NEUTROABS 20.2* 21.3* 19.5* 20.8* 18.5*  HGB 8.3* 9.8* 8.1* 8.6* 8.4*  HCT 26.6* 31.7* 26.1* 27.7* 27.4*  MCV 97.4 97.8 96.3 96.2 95.8  PLT 252 266 258 263 258   BMP &GFR Recent Labs  Lab 05/16/24 1308 05/17/24 0047 05/18/24 0305 05/19/24 0440 05/20/24 0555  NA 139 142 136 136 134*  K 3.4* 3.6 3.5 3.3* 3.4*  CL 102 104 98 98 97*  CO2 24 23 25 25 25   GLUCOSE 86 80 116* 147* 110*  BUN 24* 23 12 22  28*  CREATININE 4.34* 4.94* 2.99* 3.87* 4.54*  CALCIUM  8.0* 8.6* 8.0* 8.1* 7.8*  MG  --   --  1.8 2.0  --   PHOS  --   --  2.2* 3.1 3.4   Estimated Creatinine Clearance: 10.1 mL/min (A) (by C-G formula based on SCr of 4.54 mg/dL (H)). Liver & Pancreas: Recent Labs  Lab 05/16/24 1308 05/17/24 0047 05/18/24 0305 05/19/24 0440 05/20/24 0555 05/20/24 1010   AST 12* 13*  --   --   --   --   ALT 7 8  --   --   --   --   ALKPHOS 55 66  --   --   --   --   BILITOT 1.3* 1.1  --   --   --   --   PROT 5.8* 6.0*  --   --   --  5.7*  ALBUMIN 2.0* 1.9* 1.7* 1.6* 1.6*  --    Recent Labs  Lab 05/16/24 1308  LIPASE 22   No results for input(s):  AMMONIA in the last 168 hours. Diabetic: No results for input(s): HGBA1C in the last 72 hours. Recent Labs  Lab 05/20/24 1228 05/20/24 1811 05/20/24 2141 05/21/24 0826 05/21/24 1150  GLUCAP 90 80 91 110* 125*   Cardiac Enzymes: No results for input(s): CKTOTAL, CKMB, CKMBINDEX, TROPONINI in the last 168 hours. No results for input(s): PROBNP in the last 8760 hours. Coagulation Profile: No results for input(s): INR, PROTIME in the last 168 hours. Thyroid Function Tests: No results for input(s): TSH, T4TOTAL, FREET4, T3FREE, THYROIDAB in the last 72 hours. Lipid Profile: No results for input(s): CHOL, HDL, LDLCALC, TRIG, CHOLHDL, LDLDIRECT in the last 72 hours. Anemia Panel: No results for input(s): VITAMINB12, FOLATE, FERRITIN, TIBC, IRON, RETICCTPCT in the last 72 hours. Urine analysis:    Component Value Date/Time   COLORURINE YELLOW (A) 10/31/2017 1959   APPEARANCEUR HAZY (A) 10/31/2017 1959   APPEARANCEUR Hazy 12/09/2013 1046   LABSPEC 1.017 10/31/2017 1959   LABSPEC 1.031 12/09/2013 1046   PHURINE 6.0 10/31/2017 1959   GLUCOSEU >=500 (A) 10/31/2017 1959   GLUCOSEU >=500 12/09/2013 1046   HGBUR NEGATIVE 10/31/2017 1959   BILIRUBINUR NEGATIVE 10/31/2017 1959   BILIRUBINUR Negative 12/09/2013 1046   KETONESUR NEGATIVE 10/31/2017 1959   PROTEINUR >=300 (A) 10/31/2017 1959   NITRITE NEGATIVE 10/31/2017 1959   LEUKOCYTESUR NEGATIVE 10/31/2017 1959   LEUKOCYTESUR Negative 12/09/2013 1046   Sepsis Labs: Invalid input(s): PROCALCITONIN, LACTICIDVEN  Microbiology: Recent Results (from the past 240 hours)  Blood culture (routine x  2)     Status: None (Preliminary result)   Collection Time: 05/16/24  4:43 PM   Specimen: BLOOD  Result Value Ref Range Status   Specimen Description BLOOD BLOOD RIGHT HAND  Final   Special Requests   Final    BOTTLES DRAWN AEROBIC AND ANAEROBIC BOTTLES DRAWN AEROBIC ONLY   Culture   Final    NO GROWTH 4 DAYS Performed at [facility], [address], [place], [place] [address]    Report Status PENDING  Incomplete    Radiology Studies: CT CHEST WO CONTRAST Result Date: 05/20/2024 CLINICAL DATA:  Pleural effusion assess for lung mass EXAM: CT CHEST WITHOUT CONTRAST TECHNIQUE: Multidetector CT imaging of the chest was performed following the standard protocol without IV contrast. RADIATION DOSE REDUCTION: This exam was performed according to the departmental dose-optimization program which includes automated exposure control, adjustment of the mA and/or kV according to patient size and/or use of iterative reconstruction technique. COMPARISON:  Chest x-ray 05/19/2024, chest CT 07/15/2022, CT 05/16/2024, FINDINGS: Cardiovascular: Limited assessment without intravenous contrast. Moderate aortic atherosclerosis. No aneurysm. Right-sided central venous port with tip in the SVC. Multi vessel coronary vascular calcification. Normal cardiac size. No sizable pericardial effusion. Mediastinum/Nodes: Patent trachea. No obvious thyroid mass. No suspicious lymph nodes. Esophagus within normal limits. Lungs/Pleura: Trace right-sided pleural effusion. Small slightly loculated appearing left pleural effusion. Subpleural rounded consolidation at the lingula measures about 18 x 15 mm on series 7, image 69. Ovoid masslike consolidation at the anterior left lung base, contiguous with curvilinear consolidation posteriorly. Nodular consolidation anteriorly measures up to 3.2 cm. Elsewhere patchy areas of mild bilateral ground-glass density. Upper Abdomen: See previously performed abdominopelvic CT Musculoskeletal:  Gene considerable generalized subcutaneous edema. No acute osseous abnormality. Chronic compression deformity and loss of disc space at T6-T7 which may be related to remote trauma or infection. Advanced disc space narrowing T3 through T6. IMPRESSION: 1. Small slightly loculated appearing left pleural effusion. Trace right-sided pleural effusion. 2.  Subpleural rounded consolidation at the lingula measuring up to 18 mm. Ovoid masslike consolidation at the anterior left lung base, contiguous with curvilinear consolidation posteriorly. Appearance is suggestive of rounded atelectasis, however surveillance on CT follow-up is recommended to exclude nodule, given history of prior malignancy. 3. Elsewhere patchy areas of mild bilateral ground-glass density, either infectious or inflammatory 4. Considerable generalized subcutaneous edema consistent with anasarca. 5. Aortic atherosclerosis. Aortic Atherosclerosis (ICD10-I70.0). Electronically Signed   By: Luke Bun M.D.   On: 05/20/2024 23:27       Gustin Zobrist T. Ronie Fleeger [facility]  If 7PM-7AM, please contact night-coverage [email or web address] 05/21/2024, 12:22 PM

## 2024-05-21 NOTE — Progress Notes (Signed)
 [facility] Progress Note   Subjective:   Seen and examined patient at bedside. Brother and sister are bedside this AM.  Pt denies any specific complaint this AM. Had HD yesterday, ran even.   Objective Vitals:   05/20/24 1816 05/20/24 2211 05/21/24 0412 05/21/24 0835  BP: 116/68 (!) 124/54 132/68 129/62  Pulse: 62 65 62   Resp: 14 15 18    Temp:  97.9 F (36.6 C) 97.8 F (36.6 C)   TempSrc:  Oral Oral   SpO2: 100% 100% 100%   Weight:      Height:       Physical Exam General: Awake, chronically ill-appearing; thin, on RA, NAD Heart: S1 and S2; No murmurs, gallops, rubs Lungs: Clear anteriorly Abdomen:Soft and non-tender Extremities: No LE edema Dialysis Access: L AVG +t/b no bleeding when dressing removed  Filed Weights   05/17/24 1938 05/20/24 1306  Weight: 58.6 kg 58.3 kg    Intake/Output Summary (Last 24 hours) at 05/21/2024 0850 Last data filed at 05/20/2024 2300 Gross per 24 hour  Intake 120 ml  Output 0.1 ml  Net 119.9 ml     Additional Objective Labs: Basic Metabolic Panel: Recent Labs  Lab 05/18/24 0305 05/19/24 0440 05/20/24 0555  NA 136 136 134*  K 3.5 3.3* 3.4*  CL 98 98 97*  CO2 25 25 25   GLUCOSE 116* 147* 110*  BUN 12 22 28*  CREATININE 2.99* 3.87* 4.54*  CALCIUM  8.0* 8.1* 7.8*  PHOS 2.2* 3.1 3.4   Liver Function Tests: Recent Labs  Lab 05/16/24 1308 05/17/24 0047 05/18/24 0305 05/19/24 0440 05/20/24 0555 05/20/24 1010  AST 12* 13*  --   --   --   --   ALT 7 8  --   --   --   --   ALKPHOS 55 66  --   --   --   --   BILITOT 1.3* 1.1  --   --   --   --   PROT 5.8* 6.0*  --   --   --  5.7*  ALBUMIN 2.0* 1.9* 1.7* 1.6* 1.6*  --    Recent Labs  Lab 05/16/24 1308  LIPASE 22   CBC: Recent Labs  Lab 05/16/24 1308 05/17/24 0047 05/18/24 0305 05/19/24 0440 05/20/24 0555  WBC 21.7* 21.7* 20.5* 21.4* 19.7*  NEUTROABS 20.2* 21.3* 19.5* 20.8* 18.5*  HGB 8.3* 9.8* 8.1* 8.6* 8.4*  HCT 26.6* 31.7* 26.1* 27.7* 27.4*   MCV 97.4 97.8 96.3 96.2 95.8  PLT 252 266 258 263 258   Blood Culture    Component Value Date/Time   SDES BLOOD BLOOD RIGHT HAND 05/16/2024 1643   SPECREQUEST  05/16/2024 1643    BOTTLES DRAWN AEROBIC AND ANAEROBIC BOTTLES DRAWN AEROBIC ONLY   CULT  05/16/2024 1643    NO GROWTH 4 DAYS Performed at [facility], [address] [place], [place] [address]    REPTSTATUS PENDING 05/16/2024 1643    Cardiac Enzymes: No results for input(s): CKTOTAL, CKMB, CKMBINDEX, TROPONINI in the last 168 hours. CBG: Recent Labs  Lab 05/20/24 0808 05/20/24 1228 05/20/24 1811 05/20/24 2141 05/21/24 0826  GLUCAP 99 90 80 91 110*   Iron Studies: No results for input(s): IRON, TIBC, TRANSFERRIN, FERRITIN in the last 72 hours. Lab Results  Component Value Date   INR 1.3 (H) 03/05/2024   INR 1.1 11/03/2019   INR 1.04 05/12/2018   Studies/Results: CT CHEST WO CONTRAST Result Date: 05/20/2024 CLINICAL DATA:  Pleural  effusion assess for lung mass EXAM: CT CHEST WITHOUT CONTRAST TECHNIQUE: Multidetector CT imaging of the chest was performed following the standard protocol without IV contrast. RADIATION DOSE REDUCTION: This exam was performed according to the departmental dose-optimization program which includes automated exposure control, adjustment of the mA and/or kV according to patient size and/or use of iterative reconstruction technique. COMPARISON:  Chest x-ray 05/19/2024, chest CT 07/15/2022, CT 05/16/2024, FINDINGS: Cardiovascular: Limited assessment without intravenous contrast. Moderate aortic atherosclerosis. No aneurysm. Right-sided central venous port with tip in the SVC. Multi vessel coronary vascular calcification. Normal cardiac size. No sizable pericardial effusion. Mediastinum/Nodes: Patent trachea. No obvious thyroid mass. No suspicious lymph nodes. Esophagus within normal limits. Lungs/Pleura: Trace right-sided pleural effusion. Small slightly loculated  appearing left pleural effusion. Subpleural rounded consolidation at the lingula measures about 18 x 15 mm on series 7, image 69. Ovoid masslike consolidation at the anterior left lung base, contiguous with curvilinear consolidation posteriorly. Nodular consolidation anteriorly measures up to 3.2 cm. Elsewhere patchy areas of mild bilateral ground-glass density. Upper Abdomen: See previously performed abdominopelvic CT Musculoskeletal: Gene considerable generalized subcutaneous edema. No acute osseous abnormality. Chronic compression deformity and loss of disc space at T6-T7 which may be related to remote trauma or infection. Advanced disc space narrowing T3 through T6. IMPRESSION: 1. Small slightly loculated appearing left pleural effusion. Trace right-sided pleural effusion. 2. Subpleural rounded consolidation at the lingula measuring up to 18 mm. Ovoid masslike consolidation at the anterior left lung base, contiguous with curvilinear consolidation posteriorly. Appearance is suggestive of rounded atelectasis, however surveillance on CT follow-up is recommended to exclude nodule, given history of prior malignancy. 3. Elsewhere patchy areas of mild bilateral ground-glass density, either infectious or inflammatory 4. Considerable generalized subcutaneous edema consistent with anasarca. 5. Aortic atherosclerosis. Aortic Atherosclerosis (ICD10-I70.0). Electronically Signed   By: Luke Bun M.D.   On: 05/20/2024 23:27   IR US  CHEST Result Date: 05/20/2024 INDICATION: 68 year old female with renal dysfunction, C diff colitis with left pleural effusion by abdominopelvic CT imaging. Left thoracentesis requested. EXAM: CHEST ULTRASOUND COMPARISON:  None Available. FINDINGS: Small left-sided pleural effusion. No significant pocket of fluid or percutaneous window to allow safe thoracentesis. Risks outweigh the benefits. IMPRESSION: Small left-sided pleural effusion with no safe window for percutaneous access Performed  by: Kacie Matthews PA-C Electronically Signed   By: CHRISTELLA.  Shick M.D.   On: 05/20/2024 11:23   VAS US  UPPER EXTREMITY VENOUS DUPLEX Result Date: 05/19/2024 UPPER VENOUS STUDY  Patient Name:  LEANDA PADMORE  Date of Exam:   05/19/2024 Medical Rec #: [id number]         Accession #:    [id number] Date of Birth: Nov 24, 1955         Patient Gender: F Patient Age:   24 years Exam Location:  [facility] Procedure:      VAS US  UPPER EXTREMITY VENOUS DUPLEX Referring Phys: MIGNON GONFA --------------------------------------------------------------------------------  Indications: Left forearm swelling Risk Factors: Cancer cervical, on chemotherapy ESRD on dialysis. Limitations: Poor ultrasound/tissue interface, musculoskeletal features and Patient with dementia/non compliant with directions. Had to call staff to help with repositioning. Comparison Study: No prior study on file Performing Technologist: Alberta Lis RVS  Examination Guidelines: A complete evaluation includes B-mode imaging, spectral Doppler, color Doppler, and power Doppler as needed of all accessible portions of each vessel. Bilateral testing is considered an integral part of a complete examination. Limited examinations for reoccurring indications may be performed as noted.  Right Findings: +----------+------------+---------+-----------+----------+---------------------+ RIGHT  CompressiblePhasicitySpontaneousProperties       Summary        +----------+------------+---------+-----------+----------+---------------------+ IJV         Partial                                         Acute         +----------+------------+---------+-----------+----------+---------------------+ Subclavian                                           Not well visualized                                                      secondary to port in                                                            chest and                                                               musculoskeletal                                                              features        +----------+------------+---------+-----------+----------+---------------------+  Left Findings: +----------+------------+---------+-----------+----------+--------------+ LEFT      CompressiblePhasicitySpontaneousProperties   Summary     +----------+------------+---------+-----------+----------+--------------+ IJV           Full       Yes       Yes                             +----------+------------+---------+-----------+----------+--------------+ Subclavian    Full       Yes       No                              +----------+------------+---------+-----------+----------+--------------+ Axillary                 Yes       Yes                             +----------+------------+---------+-----------+----------+--------------+ Radial        Full                                                 +----------+------------+---------+-----------+----------+--------------+  Ulnar         Full                                                 +----------+------------+---------+-----------+----------+--------------+ Cephalic                                            Not visualized +----------+------------+---------+-----------+----------+--------------+ Basilic       Full                                                 +----------+------------+---------+-----------+----------+--------------+  Summary:  Right: Acute, non occlusive DVT in the right IJV.  Left: No evidence of deep vein thrombosis in the visualized veins of the left upper extremity. No evidence of superficial vein thrombosis in the visualized veins of the upper extremity. Significant subcutaneous edema throughout the left upper extremity. Acoustic shadowing throughout left upper arm. Limited visualization of dialysis access, but it appears patent.  *See table(s) above for  measurements and observations.  Diagnosing physician: Gaile New MD Electronically signed by Gaile New MD on 05/19/2024 at 8:27:08 PM.    Final    DG Forearm Left Result Date: 05/19/2024 CLINICAL DATA:  Forearm swelling. EXAM: LEFT FOREARM - 2 VIEW COMPARISON:  None Available. FINDINGS: Proximal forearm edema. No underlying osseous erosion to suggest osteomyelitis. IMPRESSION: Proximal forearm swelling.  No underlying osseous abnormality. Electronically Signed   By: Newell Eke M.D.   On: 05/19/2024 13:33   DG Chest 2 View Result Date: 05/19/2024 CLINICAL DATA:  Pleural effusion, left arm swelling. EXAM: CHEST - 2 VIEW COMPARISON:  05/16/2024 and CT chest 07/15/2022. FINDINGS: Right IJ Port-A-Cath terminates in the SVC. Heart is enlarged, stable. Thoracic aorta is calcified. Lungs are low in volume with central pulmonary vascular congestion and a moderate layering left pleural effusion. Left basilar collapse/consolidation. Findings are similar to 05/16/2024. IMPRESSION: 1. Moderately layering with pleural effusion with left basilar collapse/consolidation, due to atelectasis or pneumonia. 2. Central pulmonary vascular congestion. Electronically Signed   By: Newell Eke M.D.   On: 05/19/2024 13:32    Medications:    (feeding supplement) PROSource Plus  30 mL Oral BID BM   amoxicillin -clavulanate  1 tablet Oral Q12H   apixaban   10 mg Oral BID   Followed by   [name] ON 05/26/2024] apixaban   5 mg Oral BID   atorvastatin   40 mg Oral Daily   carvedilol   25 mg Oral BID WC   Chlorhexidine  Gluconate Cloth  6 each Topical Q0600   cloNIDine   0.3 mg Transdermal Weekly   diltiazem   360 mg Oral Daily   feeding supplement  1 Container Oral TID BM   ferrous sulfate   325 mg Oral Daily   fidaxomicin   200 mg Oral BID   losartan   100 mg Oral Daily   mirtazapine   7.5 mg Oral QHS   multivitamin  1 tablet Oral QHS   pantoprazole   40 mg Oral Daily   sodium chloride  flush  10-40 mL Intracatheter Q12H    spironolactone   25 mg Oral Daily   thiamine   100 mg Oral Daily  Dialysis Orders: MWF [facility]  3.5hrs BFR 400 DFR 500 EDW 53kg 2K/2.5Ca Heparin  bolus 600 units with HD then 400 units/hr Calcitriol  1.4mcg with HD - last dose 9/3 Sensipar 60mg  with HD - last dose 9/3 Micera 75mcg Q 4 weeks - per records, dose just ordered Calcium  Acetate 667mh TID with meals   Assessment/Plan: Colo-uterine fistula: per abd CT. Per surgery/ pmd - appears surgery has signed off and plans outpt elective management; ID recommended augmentin  as well + imaging of chest, pending Cdif colitis: per ID on fidaxomicin  LUE edema: LUE duplex ordered by Hospitalist - no issue with LUE but acute non occlusive RIJ clot noted - anticoag plan per primary.  No issues with AVG thus far. Monitor. ESRD: on HD MWF. Next HD 9/10 HTN: HTN on 5 BP agents. Noted allergy to Amlodipine .  BP much better in past ~24h, cont current care.  Volume: euvolemic on exam, no edema, CXR no edema. Didn't reach max UF 9/5 2nd hypotension. Not eating much, limited UF with HD. 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 Holding binders, VDRA, and Sensipar for now.  GOC: Palliative consulted - full scope at this time it appears  Manuelita Barters MD [facility] Pager [phone]

## 2024-05-21 NOTE — Telephone Encounter (Signed)
 Patient Product/process development scientist completed.    The patient is insured through [employer] [facility]. Patient has Medicare and is not eligible for a copay card, but may be able to apply for patient assistance or Medicare RX Payment Plan (Patient Must reach out to their plan, if eligible for payment plan), if available.    Ran test claim for vancomycin  125 mg and the current 8 day co-pay is $87.32 due to a deductible.   This test claim was processed through [facility]- copay amounts may vary at other pharmacies due to pharmacy/plan contracts, or as the patient moves through the different stages of their insurance plan.     Reyes Sharps, CPHT Pharmacy Technician III Certified Patient Advocate [facility] Patient Advocate Team Direct Number: [phone]  Fax: [phone]

## 2024-05-21 NOTE — Progress Notes (Signed)
 Physical Therapy Treatment Patient Details Name: Tracy Jennings MRN: [id number] DOB: 1956-03-13 Today's Date: 05/21/2024   History of Present Illness Patient is 68 yo female admitted on 05/16/24 for generalized weakness concerns for colouterine fistula. + C. Diff. PMH significant for dementia, HTN, DM, HLD, cervical cancer, recurrent pancreatitis, ESRD on HD.    PT Comments  Pt resting in sidelying on arrival and agreeable to session, however session limited due to increased pain at sacrum in sitting. Pt seen in conjunction with OT to maximize pt activity tolerance and functional mobility progression. Pt able to come to sitting EOB with max A +2 with dense cues for sequencing. Pt with strong L lateral lean initially due to sacral pain, pt able to correct with mod A briefly, however returning to either L lateral or strong anterior flexion. Pt able to attempt standing x2 with total A +2 to elevate hips from sitting surface with pt maintaining 90 degree flexion at trunk throughout attempts. Pt declining transfer to chair and transfer deferred due to flexed posture for pt safety. Pt returned to supine and self positioning in R sidelying, pillows placed under L hip for pressure relief. Encouraged continued frequent positional changes for continued pressure relief. Pt continues to benefit from skilled PT services to progress toward functional mobility goals.     If plan is discharge home, recommend the following: A lot of help with walking and/or transfers;A lot of help with bathing/dressing/bathroom;Assist for transportation;Help with stairs or ramp for entrance;Supervision due to cognitive status;Assistance with cooking/housework;Direct supervision/assist for medications management;Direct supervision/assist for financial management   Can travel by private vehicle     No  Equipment Recommendations  None recommended by PT;Other (comment)    Recommendations for Other Services       Precautions /  Restrictions Precautions Precautions: Fall Recall of Precautions/Restrictions: Impaired Restrictions Weight Bearing Restrictions Per Provider Order: No     Mobility  Bed Mobility Overal bed mobility: Needs Assistance Bed Mobility: Supine to Sit, Sit to Supine Rolling: Min assist   Supine to sit: Max assist, +2 for physical assistance, +2 for safety/equipment, HOB elevated, Used rails Sit to supine: Mod assist, HOB elevated, Used rails (for BLE)   General bed mobility comments: pt decline OOB    Transfers Overall transfer level: Needs assistance Equipment used: Rolling walker (2 wheels) Transfers: Sit to/from Stand Sit to Stand: Total assist, +2 physical assistance, +2 safety/equipment           General transfer comment: attempted x2 trials, pt kept a 90 deg position when attempting to use walker    Ambulation/Gait                   Stairs             Wheelchair Mobility     Tilt Bed    Modified Rankin (Stroke Patients Only)       Balance Overall balance assessment: Needs assistance Sitting-balance support: Feet supported Sitting balance-Leahy Scale: Poor Sitting balance - Comments: Pt leaning side to side to get off peri areea Postural control: Right lateral lean, Left lateral lean Standing balance support: Bilateral upper extremity supported Standing balance-Leahy Scale: Poor Standing balance comment: unable to come up into a standing position                            Communication Communication Communication: No apparent difficulties  Cognition Arousal: Lethargic Behavior During Therapy: Flat affect  Following commands: Impaired Following commands impaired: Follows one step commands with increased time    Cueing Cueing Techniques: Verbal cues, Gestural cues, Tactile cues, Visual cues  Exercises      General Comments General comments (skin integrity, edema, etc.): pt sister  present thoughout session and supportive      Pertinent Vitals/Pain Pain Assessment Pain Assessment: Faces Faces Pain Scale: Hurts whole lot Breathing: occasional labored breathing, short period of hyperventilation Negative Vocalization: occasional moan/groan, low speech, negative/disapproving quality Facial Expression: smiling or inexpressive Body Language: tense, distressed pacing, fidgeting Consolability: distracted or reassured by voice/touch PAINAD Score: 4 Pain Location: peri area Pain Descriptors / Indicators: Tender, Discomfort Pain Intervention(s): Monitored during session, Limited activity within patient's tolerance, Repositioned    Home Living                          Prior Function            PT Goals (current goals can now be found in the care plan section) Acute Rehab PT Goals PT Goal Formulation: With patient/family Time For Goal Achievement: 06/01/24 Progress towards PT goals: Progressing toward goals    Frequency    Min 2X/week      PT Plan      Co-evaluation PT/OT/SLP Co-Evaluation/Treatment: Yes Reason for Co-Treatment: To address functional/ADL transfers PT goals addressed during session: Mobility/safety with mobility OT goals addressed during session: ADL's and self-care      AM-PAC PT 6 Clicks Mobility   Outcome Measure  Help needed turning from your back to your side while in a flat bed without using bedrails?: A Little Help needed moving from lying on your back to sitting on the side of a flat bed without using bedrails?: A Lot Help needed moving to and from a bed to a chair (including a wheelchair)?: Total Help needed standing up from a chair using your arms (e.g., wheelchair or bedside chair)?: Total Help needed to walk in hospital room?: Total Help needed climbing 3-5 steps with a railing? : Total 6 Click Score: 9    End of Session   Activity Tolerance: Patient limited by fatigue;Patient limited by pain Patient left:  in bed;with call bell/phone within reach;with bed alarm set;with family/visitor present Nurse Communication: Mobility status PT Visit Diagnosis: Muscle weakness (generalized) (M62.81);Unsteadiness on feet (R26.81)     Time: 9052-8987 PT Time Calculation (min) (ACUTE ONLY): 25 min  Charges:    $Therapeutic Activity: 8-22 mins PT General Charges $$ ACUTE PT VISIT: 1 Visit                     Naliya Gish R. PTA Acute Rehabilitation Services Office: [phone]   [name] 05/21/2024, 12:57 PM

## 2024-05-21 NOTE — Progress Notes (Signed)
  Order placed for IV team to draw labs from port

## 2024-05-21 NOTE — TOC Initial Note (Signed)
 Transition of Care [facility]) - Initial/Assessment Note    Patient Details  Name: Tracy Jennings MRN: [id number] Date of Birth: [birth year]  Transition of Care [facility]) CM/SW Contact:    Jeoffrey LITTIE Moose, [place] Phone Number: 05/21/2024, 12:26 PM  Clinical Narrative:                 Pt admitted from [facility] due to weakness for several days. Pt currently Ox1. CSW called pt sister, [name], to complete SNF workup but she did not answer and CSW was unable to leave a voicemail.  CSW then called pt son, [name], to complete SNF workup but he did not answer. CSW left him a voicemail and provided callback information. CSW will continue to follow.  Expected Discharge Plan: Skilled Nursing Facility Barriers to Discharge: Continued Medical Work up, English as a second language teacher, SNF Pending bed offer   Patient Goals and CMS Choice Patient states their goals for this hospitalization and ongoing recovery are:: SNF          Expected Discharge Plan and Services       Living arrangements for the past 2 months: Single Family Home                                      Prior Living Arrangements/Services Living arrangements for the past 2 months: Single Family Home   Patient language and need for interpreter reviewed:: Yes Do you feel safe going back to the place where you live?: Yes      Need for Family Participation in Patient Care: Yes (Comment) Care giver support system in place?: Yes (comment)   Criminal Activity/Legal Involvement Pertinent to Current Situation/Hospitalization: No - Comment as needed  Activities of Daily Living   ADL Screening (condition at time of admission) Independently performs ADLs?: No Does the patient have a NEW difficulty with bathing/dressing/toileting/self-feeding that is expected to last >3 days?: No Does the patient have a NEW difficulty with getting in/out of bed, walking, or climbing stairs that is expected to last >3 days?: No Does the patient have a  NEW difficulty with communication that is expected to last >3 days?: No Is the patient deaf or have difficulty hearing?: No Does the patient have difficulty seeing, even when wearing glasses/contacts?: No Does the patient have difficulty concentrating, remembering, or making decisions?: Yes  Permission Sought/Granted Permission sought to share information with : Facility Medical sales representative, Family Supports    Share Information with NAME: [name]     Permission granted to share info w Relationship: Son  Permission granted to share info w Contact Information: [phone]  Emotional Assessment Appearance:: Appears stated age Attitude/Demeanor/Rapport: Unable to Assess Affect (typically observed): Unable to Assess Orientation: : Oriented to Self Alcohol / Substance Use: Not Applicable Psych Involvement: No (comment)  Admission diagnosis:  Sepsis (HCC) [A41.9] Other female intestinal-genital tract fistulae [N82.4] Other specified bacterial intestinal infections [A04.8] Fistula [L98.8] Patient Active Problem List   Diagnosis Date Noted   Fistula 05/17/2024   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   Anemia in ESRD (end-stage renal disease) (HCC) 03/05/2024   Myocardial injury 03/05/2024  Heart murmur, systolic 04/13/2022   Dementia with behavioral disturbance (HCC) 03/29/2022   Headache 03/29/2022   Hypertensive urgency 03/29/2022   Hypertensive emergency 03/28/2022   EKG, abnormal 03/28/2022   History of anesthesia reaction 07/06/2020   Failure to thrive in adult    Pressure injury of skin 06/27/2020   Thrush    Anemia of  chronic disease    Palliative care by specialist    Generalized abdominal pain    Acute blood loss anemia    Intractable vomiting 06/21/2020   Hypokalemia 06/21/2020   Hyponatremia 06/21/2020   Hypomagnesemia 06/21/2020   Intractable nausea and vomiting 06/21/2020   Nausea vomiting and diarrhea 06/21/2020   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   Goals of care, counseling/discussion 03/20/2020   Cervical cancer (HCC) 03/11/2020   Orthostatic hypotension    Syncope 11/04/2019   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   Type 2 diabetes mellitus with other specified complication (HCC) 11/03/2019   Unable to care for self 11/03/2019   Essential hypertension 11/03/2019   Metabolic acidosis, increased anion gap (IAG)    Generalized weakness    Recurrent syncope 10/28/2019   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 [place] [address] Phone: [phone] Fax: [phone]  [facility] [address] [place] [address] Phone: [phone] Fax: [phone]     Social Drivers of Health (SDOH) Social History: SDOH Screenings   Food Insecurity: No Food Insecurity (05/16/2024)  Recent Concern: Food Insecurity - Food Insecurity Present (04/24/2024)   Received from [facility]  Housing: Low Risk  (05/16/2024)  Transportation Needs: No Transportation Needs (05/16/2024)  Recent Concern: Transportation Needs - Unmet Transportation Needs (04/24/2024)   Received from [facility]  Utilities: Not At Risk (05/16/2024)  Financial Resource Strain: High Risk  (04/24/2024)   Received from [facility]  Social Connections: Unknown (05/16/2024)  Tobacco Use: Low Risk  (05/16/2024)   SDOH Interventions:     Readmission Risk Interventions     No data to display

## 2024-05-21 NOTE — Progress Notes (Signed)
 Occupational Therapy Treatment Patient Details Name: Tracy Jennings MRN: [id number] DOB: May 18, 1956 Today's Date: 05/21/2024   History of present illness Patient is 68 yo female admitted on 05/16/24 for generalized weakness concerns for colouterine fistula. + C. Diff. PMH significant for dementia, HTN, DM, HLD, cervical cancer, recurrent pancreatitis, ESRD on HD.   OT comments  Pt presented in bed with covers over her head and her sister present throughout the session trying to encouraging the patient. Pt required max x2 to sit at EOB, max assist while sitting attempting to lean away from peri area, total assist x2 to attempt to stand x2 trials and dependent for lateral transfers x3 to [facility] Pt then required total assist at bed level for peri care post BM. At this time family wants pt to go to SNF. Patient will benefit from continued inpatient follow up therapy, <3 hours/day.      If plan is discharge home, recommend the following:  A lot of help with walking and/or transfers;A lot of help with bathing/dressing/bathroom;Assistance with cooking/housework;Assistance with feeding;Direct supervision/assist for medications management;Direct supervision/assist for financial management;Assist for transportation;Help with stairs or ramp for entrance;Supervision due to cognitive status   Equipment Recommendations   (TBD)    Recommendations for Other Services      Precautions / Restrictions Precautions Precautions: Fall Recall of Precautions/Restrictions: Impaired Restrictions Weight Bearing Restrictions Per Provider Order: No       Mobility Bed Mobility Overal bed mobility: Needs Assistance Bed Mobility: Supine to Sit, Sit to Supine Rolling: Min assist   Supine to sit: Max assist, +2 for physical assistance, +2 for safety/equipment, HOB elevated, Used rails Sit to supine: Mod assist, HOB elevated, Used rails (for BLE)   General bed mobility comments: pt decline OOB    Transfers Overall  transfer level: Needs assistance Equipment used: Rolling walker (2 wheels) Transfers: Sit to/from Stand Sit to Stand: Total assist, +2 physical assistance, +2 safety/equipment           General transfer comment: attempted x2 trials, pt kept a 90 deg position when attempting to use walker     Balance Overall balance assessment: Needs assistance Sitting-balance support: Feet supported Sitting balance-Leahy Scale: Poor Sitting balance - Comments: Pt leaning side to side to get off peri areea Postural control: Right lateral lean, Left lateral lean Standing balance support: Bilateral upper extremity supported Standing balance-Leahy Scale: Poor Standing balance comment: unable to come up into a standing position                           ADL either performed or assessed with clinical judgement   ADL Overall ADL's : Needs assistance/impaired   Eating/Feeding Details (indicate cue type and reason): Pt was set up for self feeding but did not want to complete in session     Upper Body Bathing: Maximal assistance;Sitting   Lower Body Bathing: Total assistance;Bed level   Upper Body Dressing : Maximal assistance;Sitting   Lower Body Dressing: Total assistance;Bed level       Toileting- Clothing Manipulation and Hygiene: Total assistance              Extremity/Trunk Assessment Upper Extremity Assessment Upper Extremity Assessment: Generalized weakness;LUE deficits/detail LUE Deficits / Details: edema in LUE( md was amde aware at last seesion) was repositioned in session/elevated as was rolled onto at the start of session            Vision  Perception     Praxis     Communication Communication Communication: No apparent difficulties   Cognition Arousal: Lethargic Behavior During Therapy: Flat affect Cognition: History of cognitive impairments             OT - Cognition Comments: pt still making very limited comments                  Following commands: Impaired Following commands impaired: Follows one step commands with increased time      Cueing   Cueing Techniques: Verbal cues, Gestural cues, Tactile cues, Visual cues  Exercises      Shoulder Instructions       General Comments      Pertinent Vitals/ Pain       Pain Assessment Pain Assessment: Faces Faces Pain Scale: Hurts whole lot Breathing: occasional labored breathing, short period of hyperventilation Negative Vocalization: occasional moan/groan, low speech, negative/disapproving quality Facial Expression: smiling or inexpressive Body Language: tense, distressed pacing, fidgeting Consolability: distracted or reassured by voice/touch PAINAD Score: 4 Pain Location: peri area Pain Descriptors / Indicators: Tender, Discomfort Pain Intervention(s): Limited activity within patient's tolerance, Monitored during session, Repositioned  Home Living                                          Prior Functioning/Environment              Frequency  Min 2X/week        Progress Toward Goals  OT Goals(current goals can now be found in the care plan section)  Progress towards OT goals: Progressing toward goals  Acute Rehab OT Goals Patient Stated Goal: to go to rehab OT Goal Formulation: With family Time For Goal Achievement: 06/01/24 Potential to Achieve Goals: Fair ADL Goals Pt Will Perform Grooming: with supervision;sitting Pt Will Perform Upper Body Bathing: with supervision;sitting Pt Will Perform Lower Body Bathing: with min assist;sit to/from stand Pt Will Perform Upper Body Dressing: with supervision;sitting Pt Will Perform Lower Body Dressing: with min assist;sit to/from stand Pt Will Transfer to Toilet: with min assist;bedside commode  Plan      Co-evaluation    PT/OT/SLP Co-Evaluation/Treatment: Yes Reason for Co-Treatment: To address functional/ADL transfers   OT goals addressed during session: ADL's and  self-care      AM-PAC OT 6 Clicks Daily Activity     Outcome Measure   Help from another person eating meals?: A Little Help from another person taking care of personal grooming?: A Lot Help from another person toileting, which includes using toliet, bedpan, or urinal?: Total Help from another person bathing (including washing, rinsing, drying)?: Total Help from another person to put on and taking off regular upper body clothing?: A Lot Help from another person to put on and taking off regular lower body clothing?: Total 6 Click Score: 10    End of Session Equipment Utilized During Treatment: Gait belt;Rolling walker (2 wheels)  OT Visit Diagnosis: Unsteadiness on feet (R26.81);Other abnormalities of gait and mobility (R26.89);Repeated falls (R29.6);Muscle weakness (generalized) (M62.81);Pain Pain - part of body:  (peri area)   Activity Tolerance Patient limited by fatigue;Patient limited by pain   Patient Left in bed;with call bell/phone within reach;with bed alarm set;with family/visitor present   Nurse Communication Mobility status        Time: 9052-8986 OT Time Calculation (min): 26 min  Charges: OT General Charges $OT Visit:  1 Visit Warrick POUR OTR/L  Acute Rehab Services  [phone] office number   Warrick Berber 05/21/2024, 10:34 AM

## 2024-05-21 NOTE — TOC Progression Note (Signed)
 Transition of Care (TOC) - Progression Note   Spoke to patient's brother [name].   Discussed PT recommendation for short term rehab . NCM received message that family would rather take her home at discharge. Per [name] family prefers that patient goes to SNF for short term rehab at discharge. They would like her stronger before returning home. SW aware  Patient Details  Name: Tracy Jennings MRN: [id number] Date of Birth: March 18, 1956  [facility]) CM/SW Contact  Nnenna Meador, Powell Jansky, RN Phone Number: 05/21/2024, 9:30 AM  Clinical Narrative:                         Expected Discharge Plan and Services                                               Social Drivers of Health (SDOH) Interventions SDOH Screenings   Food Insecurity: No Food Insecurity (05/16/2024)  Recent Concern: Food Insecurity - Food Insecurity Present (04/24/2024)   Received from [facility]  Housing: Low Risk  (05/16/2024)  Transportation Needs: No Transportation Needs (05/16/2024)  Recent Concern: Transportation Needs - Unmet Transportation Needs (04/24/2024)   [facility] from [facility]  Utilities: Not At Risk (05/16/2024)  Financial Resource Strain: High Risk (04/24/2024)   Received from [facility]  Social Connections: Unknown (05/16/2024)  Tobacco Use: Low Risk  (05/16/2024)    Readmission Risk Interventions     No data to display

## 2024-05-21 NOTE — Progress Notes (Signed)
 Order placed for IV team to draw labs from port

## 2024-05-22 ENCOUNTER — Inpatient Hospital Stay (HOSPITAL_COMMUNITY)

## 2024-05-22 DIAGNOSIS — F03918 Unspecified dementia, unspecified severity, with other behavioral disturbance: Secondary | ICD-10-CM | POA: Diagnosis not present

## 2024-05-22 DIAGNOSIS — D72825 Bandemia: Secondary | ICD-10-CM

## 2024-05-22 DIAGNOSIS — Z7189 Other specified counseling: Secondary | ICD-10-CM | POA: Diagnosis not present

## 2024-05-22 DIAGNOSIS — Z515 Encounter for palliative care: Secondary | ICD-10-CM | POA: Diagnosis not present

## 2024-05-22 DIAGNOSIS — A0472 Enterocolitis due to Clostridium difficile, not specified as recurrent: Secondary | ICD-10-CM | POA: Diagnosis not present

## 2024-05-22 DIAGNOSIS — N824 Other female intestinal-genital tract fistulae: Secondary | ICD-10-CM | POA: Diagnosis not present

## 2024-05-22 DIAGNOSIS — I10 Essential (primary) hypertension: Secondary | ICD-10-CM | POA: Diagnosis not present

## 2024-05-22 DIAGNOSIS — E46 Unspecified protein-calorie malnutrition: Secondary | ICD-10-CM

## 2024-05-22 LAB — RENAL FUNCTION PANEL
Albumin: <1.5 g/dL — ABNORMAL LOW (ref 3.5–5.0)
Anion gap: 9 (ref 5–15)
BUN: 24 mg/dL — ABNORMAL HIGH (ref 8–23)
CO2: 27 mmol/L (ref 22–32)
Calcium: 7.6 mg/dL — ABNORMAL LOW (ref 8.9–10.3)
Chloride: 98 mmol/L (ref 98–111)
Creatinine, Ser: 3.77 mg/dL — ABNORMAL HIGH (ref 0.44–1.00)
GFR, Estimated: 13 mL/min — ABNORMAL LOW (ref >60)
Glucose, Bld: 122 mg/dL — ABNORMAL HIGH (ref 70–99)
Phosphorus: 2.9 mg/dL (ref 2.5–4.6)
Potassium: 3.2 mmol/L — ABNORMAL LOW (ref 3.5–5.1)
Sodium: 134 mmol/L — ABNORMAL LOW (ref 135–145)

## 2024-05-22 LAB — GLUCOSE, CAPILLARY
Glucose-Capillary: 119 mg/dL — ABNORMAL HIGH (ref 70–99)
Glucose-Capillary: 96 mg/dL (ref 70–99)
Glucose-Capillary: 150 mg/dL — ABNORMAL HIGH (ref 70–99)

## 2024-05-22 LAB — CBC
HCT: 23.8 % — ABNORMAL LOW (ref 36.0–46.0)
Hemoglobin: 7.4 g/dL — ABNORMAL LOW (ref 12.0–15.0)
MCH: 30.2 pg (ref 26.0–34.0)
MCHC: 31.1 g/dL (ref 30.0–36.0)
MCV: 97.1 fL (ref 80.0–100.0)
Platelets: 201 K/uL (ref 150–400)
RBC: 2.45 MIL/uL — ABNORMAL LOW (ref 3.87–5.11)
RDW: 16.6 % — ABNORMAL HIGH (ref 11.5–15.5)
WBC: 22.7 K/uL — ABNORMAL HIGH (ref 4.0–10.5)
nRBC: 0 % (ref 0.0–0.2)

## 2024-05-22 LAB — MAGNESIUM: Magnesium: 1.7 mg/dL (ref 1.7–2.4)

## 2024-05-22 MED ORDER — ZINC OXIDE 40 % EX OINT
1.0000 | TOPICAL_OINTMENT | CUTANEOUS | Status: DC | PRN
  Administered 2024-05-22: 1 via TOPICAL
  Filled 2024-05-22 (×2): qty 57

## 2024-05-22 MED ORDER — PROSOURCE TF20 ENFIT COMPATIBL EN LIQD
60.0000 mL | Freq: Every day | ENTERAL | Status: DC
  Administered 2024-05-22 – 2024-05-27 (×6): 60 mL
  Filled 2024-05-22 (×6): qty 60

## 2024-05-22 MED ORDER — BANATROL TF EN LIQD
60.0000 mL | Freq: Three times a day (TID) | ENTERAL | Status: DC
  Administered 2024-05-22 – 2024-05-27 (×14): 60 mL
  Filled 2024-05-22 (×14): qty 60

## 2024-05-22 MED ORDER — OSMOLITE 1.5 CAL PO LIQD
1000.0000 mL | ORAL | Status: DC
  Administered 2024-05-22 – 2024-05-25 (×3): 1000 mL
  Filled 2024-05-22 (×4): qty 1000

## 2024-05-22 NOTE — TOC Progression Note (Signed)
 Transition of Care [facility]) - Progression Note    Patient Details  Name: Tracy Jennings MRN: [id number] Date of Birth: 15-Dec-1955  Transition [facility] Care [facility]) CM/SW Contact  Ashante Yellin LITTIE Moose, [place] Phone Number: 05/22/2024, 2:05 PM  Clinical Narrative:    CSW completed SNF workup with pt son, [name] in pt room. [name] agreeable to discharge plan, CSW completed Fl2 and sent out SNF referrals. CSW will follow up and provide bed offers.   Expected Discharge Plan: Skilled Nursing Facility Barriers to Discharge: Continued Medical Work up, English as a second language teacher, SNF Pending bed offer               Expected Discharge Plan and Services       Living arrangements for the past 2 months: Single Family Home                                       Social Drivers of Health (SDOH) Interventions SDOH Screenings   Food Insecurity: No Food Insecurity (05/16/2024)  Recent Concern: Food Insecurity - Food Insecurity Present (04/24/2024)   Received [facility]  Housing: Low Risk  (05/16/2024)  Transportation Needs: No Transportation Needs (05/16/2024)  Recent Concern: Transportation Needs - Unmet Transportation Needs (04/24/2024)   Received from [facility]  Utilities: Not At Risk (05/16/2024)  Financial Resource Strain: High Risk (04/24/2024)   Received from [facility]  Social Connections: Unknown (05/16/2024)  Tobacco Use: Low Risk  (05/16/2024)    Readmission Risk Interventions     No data to display

## 2024-05-22 NOTE — Progress Notes (Addendum)
 [facility]  Date of Admission:  05/16/2024   Total days of inpatient antibiotics 5  Principal Problem:   Colouterine fistula Active Problems:   Chronic recurrent pancreatitis (HCC)   ESRD (end stage renal disease) (HCC)   Type 2 diabetes mellitus with other specified complication (HCC)   Essential hypertension   Cervical cancer (HCC)   Dementia with behavioral disturbance (HCC)   C. difficile colitis   PVD (peripheral vascular disease) (HCC)   Fistula          Assessment: 68 year old female with history of dementia, ESRD on HD, prior history of cervical cancer, HTN, recent hospitalization for C. difficile colitis with Dificid  through 8/27 1010 days, antigen and PCR positive.  Presents with abdominal pain found to have #Persistent leukocytosis in setting of colitis, Colo uterine fistula, pleural effusion concern for mass -CT abdomen pelvis showed circumferential wall thickening of the rectosigmoid colon suspicious for colitis, Colo uterine fistula recommend surgical consultation, bilateral pleural effusions with area rounded density to be mass. -Radiology engaged per pleural fluid aspiration, too small for paracentesis - General Surgery engaged per [facility] uterine fistula, noted Colo uterine fistula can be managed electively - Yesterday patient noted stools out of her private parts.  Denies dysuria - CT chest showed small slightly loculated appearing left pleural effusion. Recommendations:  -Continue Augmentin  to complete 10 days on antibiotics for possible pneumonia with possible or loculation EOT 9/17.  Patient reports minimal respiratory symptoms - Complete Dificid  x 10 days then vancomycin  p.o. daily 7 days out form completing Augmentin .  Patient denies any loose stools today -  etiology of persistent leukocytosis is likely colovesicular fistula -Enteric precautions -Communicated to primary -ID will SO   Microbiology:    Antibiotics: Dificid  9/4- IV metronidazole  9/4-   Cultures: Blood 9/4 no growth   SUBJECTIVE: Resting in bed Interval: Afebrile overnight  Review of Systems: Review of Systems  All other systems reviewed and are negative.    Scheduled Meds:  (feeding supplement) PROSource Plus  30 mL Oral BID BM   amoxicillin -clavulanate  1 tablet Oral Q12H   apixaban   10 mg Oral BID   Followed by   [name] ON 05/26/2024] apixaban   5 mg Oral BID   atorvastatin   40 mg Oral Daily   carvedilol   25 mg Oral BID WC   Chlorhexidine  Gluconate Cloth  6 each Topical Q0600   cloNIDine   0.3 mg Transdermal Weekly   diltiazem   360 mg Oral Daily   feeding supplement  1 Container Oral BID BM   ferrous sulfate   325 mg Oral Daily   fidaxomicin   200 mg Oral BID   lactose free nutrition  237 mL Oral Daily   losartan   100 mg Oral Daily   mirtazapine   7.5 mg Oral QHS   multivitamin  1 tablet Oral QHS   pantoprazole   40 mg Oral Daily   sodium chloride  flush  10-40 mL Intracatheter Q12H   spironolactone   25 mg Oral Daily   thiamine   100 mg Oral Daily   [START ON 05/27/2024] vancomycin   125 mg Oral Daily   Continuous Infusions: PRN Meds:.acetaminophen  **OR** acetaminophen , hydrALAZINE , liver oil-zinc  oxide, mouth rinse Allergies  Allergen Reactions   Amlodipine  Swelling    Knees down to ankles Knees down to ankles   Hctz [Hydrochlorothiazide]     pancreatitis   Heparin  Other (See Comments)    Hx  HIT,   Pt reports cardiac arrest when  given heparin    Lmw Heparin      Reports cardiac arrest when given heparin    Other Other (See Comments)    Reports cardiac arrest when given during surgery Reports cardiac arrest when given during surgery Reports cardiac arrest when given during surgery    Sulfa Antibiotics Rash   Lactose    Dairy Aid [Tilactase]     Runny nose   Hydralazine      Nausea and rash    OBJECTIVE: Vitals:   05/21/24 0829 05/21/24 0835 05/21/24 1537 05/21/24 1959  BP: (!) 149/48  129/62 (!) 100/45 107/60  Pulse:   60 83  Resp:   16 18  Temp: 98.2 F (36.8 C)  97.9 F (36.6 C) 98.2 F (36.8 C)  TempSrc: Oral  Oral Oral  SpO2:   99% 97%  Weight:      Height:       Body mass index is 22.77 kg/m.  Physical Exam Constitutional:      Appearance: Normal appearance.  HENT:     Head: Normocephalic and atraumatic.     Right Ear: Tympanic membrane normal.     Left Ear: Tympanic membrane normal.     Nose: Nose normal.     Mouth/Throat:     Mouth: Mucous membranes are moist.  Eyes:     Extraocular Movements: Extraocular movements intact.     Conjunctiva/sclera: Conjunctivae normal.     Pupils: Pupils are equal, round, and reactive to light.  Cardiovascular:     Rate and Rhythm: Normal rate and regular rhythm.     Heart sounds: No murmur heard.    No friction rub. No gallop.  Pulmonary:     Effort: Pulmonary effort is normal.     Breath sounds: Normal breath sounds.  Abdominal:     General: Abdomen is flat.     Palpations: Abdomen is soft.  Musculoskeletal:        General: Normal range of motion.  Skin:    General: Skin is warm and dry.  Neurological:     General: No focal deficit present.     Mental Status: She is alert and oriented to person, place, and time.  Psychiatric:        Mood and Affect: Mood normal.       Lab Results Lab Results  Component Value Date   WBC 22.7 (H) 05/22/2024   HGB 7.4 (L) 05/22/2024   HCT 23.8 (L) 05/22/2024   MCV 97.1 05/22/2024   PLT 201 05/22/2024    Lab Results  Component Value Date   CREATININE 3.77 (H) 05/22/2024   BUN 24 (H) 05/22/2024   NA 134 (L) 05/22/2024   K 3.2 (L) 05/22/2024   CL 98 05/22/2024   CO2 27 05/22/2024    Lab Results  Component Value Date   ALT 8 05/17/2024   AST 13 (L) 05/17/2024   ALKPHOS 66 05/17/2024   BILITOT 1.1 05/17/2024        Loney Stank, MD [facility] 05/22/2024, 5:54 AM Evaluation of this patient  requires complex antimicrobial therapy evaluation and counseling + isolation needs for disease transmission risk assessment and mitigation

## 2024-05-22 NOTE — Progress Notes (Signed)
 [facility] Progress Note   Subjective:   Seen and examined patient at bedside in dialysis.  Says not eating much.  Doesn't offer much history but denies dyspnea, says diarrhea improved  Objective Vitals:   05/21/24 1537 05/21/24 1959 05/22/24 0600 05/22/24 0838  BP: (!) 100/45 107/60 (!) 115/56 118/61  Pulse: 60 83 (!) 59   Resp: 16 18 16    Temp: 97.9 F (36.6 C) 98.2 F (36.8 C) (!) 97 F (36.1 C)   TempSrc: Oral Oral Oral   SpO2: 99% 97% 100%   Weight:      Height:       Physical Exam General: Awake, chronically ill-appearing; thin, on RA, NAD Heart: S1 and S2; No murmurs, gallops, rubs Lungs: Clear anteriorly Extremities: No LE edema Dialysis Access: L AVG +t/b  Filed Weights   05/17/24 1938 05/20/24 1306  Weight: 58.6 kg 58.3 kg    Intake/Output Summary (Last 24 hours) at 05/22/2024 0901 Last data filed at 05/21/2024 2000 Gross per 24 hour  Intake 120 ml  Output --  Net 120 ml     Additional Objective Labs: Basic Metabolic Panel: Recent Labs  Lab 05/20/24 0555 05/21/24 1125 05/22/24 0314  NA 134* 137 134*  K 3.4* 3.5 3.2*  CL 97* 97* 98  CO2 25 27 27   GLUCOSE 110* 139* 122*  BUN 28* 19 24*  CREATININE 4.54* 3.27* 3.77*  CALCIUM  7.8* 7.7* 7.6*  PHOS 3.4 2.7 2.9   Liver Function Tests: Recent Labs  Lab 05/16/24 1308 05/17/24 0047 05/18/24 0305 05/20/24 0555 05/20/24 1010 05/21/24 1125 05/22/24 0314  AST 12* 13*  --   --   --   --   --   ALT 7 8  --   --   --   --   --   ALKPHOS 55 66  --   --   --   --   --   BILITOT 1.3* 1.1  --   --   --   --   --   PROT 5.8* 6.0*  --   --  5.7*  --   --   ALBUMIN 2.0* 1.9*   < > 1.6*  --  1.6* <1.5*   < > = values in this interval not displayed.   Recent Labs  Lab 05/16/24 1308  LIPASE 22   CBC: Recent Labs  Lab 05/18/24 0305 05/19/24 0440 05/20/24 0555 05/21/24 1125 05/22/24 0314  WBC 20.5* 21.4* 19.7* 21.7* 22.7*  NEUTROABS 19.5* 20.8* 18.5*  --   --   HGB 8.1* 8.6* 8.4*  7.6* 7.4*  HCT 26.1* 27.7* 27.4* 25.0* 23.8*  MCV 96.3 96.2 95.8 97.7 97.1  PLT 258 263 258 227 201   Blood Culture    Component Value Date/Time   SDES BLOOD BLOOD RIGHT HAND 05/16/2024 1643   SPECREQUEST  05/16/2024 1643    BOTTLES DRAWN AEROBIC AND ANAEROBIC BOTTLES DRAWN AEROBIC ONLY   CULT  05/16/2024 1643    NO GROWTH 4 DAYS Performed at [facility], [address] [place], [place] [address]    REPTSTATUS PENDING 05/16/2024 1643    Cardiac Enzymes: No results for input(s): CKTOTAL, CKMB, CKMBINDEX, TROPONINI in the last 168 hours. CBG: Recent Labs  Lab 05/21/24 0826 05/21/24 1150 05/21/24 1707 05/21/24 2243 05/22/24 0833  GLUCAP 110* 125* 150* 135* 119*   Iron Studies: No results for input(s): IRON, TIBC, TRANSFERRIN, FERRITIN in the last 72 hours. Lab Results  Component Value Date  INR 1.3 (H) 03/05/2024   INR 1.1 11/03/2019   INR 1.04 05/12/2018   Studies/Results: CT CHEST WO CONTRAST Result Date: 05/20/2024 CLINICAL DATA:  Pleural effusion assess for lung mass EXAM: CT CHEST WITHOUT CONTRAST TECHNIQUE: Multidetector CT imaging of the chest was performed following the standard protocol without IV contrast. RADIATION DOSE REDUCTION: This exam was performed according to the departmental dose-optimization program which includes automated exposure control, adjustment of the mA and/or kV according to patient size and/or use of iterative reconstruction technique. COMPARISON:  Chest x-ray 05/19/2024, chest CT 07/15/2022, CT 05/16/2024, FINDINGS: Cardiovascular: Limited assessment without intravenous contrast. Moderate aortic atherosclerosis. No aneurysm. Right-sided central venous port with tip in the SVC. Multi vessel coronary vascular calcification. Normal cardiac size. No sizable pericardial effusion. Mediastinum/Nodes: Patent trachea. No obvious thyroid mass. No suspicious lymph nodes. Esophagus within normal limits. Lungs/Pleura: Trace  right-sided pleural effusion. Small slightly loculated appearing left pleural effusion. Subpleural rounded consolidation at the lingula measures about 18 x 15 mm on series 7, image 69. Ovoid masslike consolidation at the anterior left lung base, contiguous with curvilinear consolidation posteriorly. Nodular consolidation anteriorly measures up to 3.2 cm. Elsewhere patchy areas of mild bilateral ground-glass density. Upper Abdomen: See previously performed abdominopelvic CT Musculoskeletal: Gene considerable generalized subcutaneous edema. No acute osseous abnormality. Chronic compression deformity and loss of disc space at T6-T7 which may be related to remote trauma or infection. Advanced disc space narrowing T3 through T6. IMPRESSION: 1. Small slightly loculated appearing left pleural effusion. Trace right-sided pleural effusion. 2. Subpleural rounded consolidation at the lingula measuring up to 18 mm. Ovoid masslike consolidation at the anterior left lung base, contiguous with curvilinear consolidation posteriorly. Appearance is suggestive of rounded atelectasis, however surveillance on CT follow-up is recommended to exclude nodule, given history of prior malignancy. 3. Elsewhere patchy areas of mild bilateral ground-glass density, either infectious or inflammatory 4. Considerable generalized subcutaneous edema consistent with anasarca. 5. Aortic atherosclerosis. Aortic Atherosclerosis (ICD10-I70.0). Electronically Signed   By: Luke Bun M.D.   On: 05/20/2024 23:27   IR US  CHEST Result Date: 05/20/2024 INDICATION: 68 year old female with renal dysfunction, C diff colitis with left pleural effusion by abdominopelvic CT imaging. Left thoracentesis requested. EXAM: CHEST ULTRASOUND COMPARISON:  None Available. FINDINGS: Small left-sided pleural effusion. No significant pocket of fluid or percutaneous window to allow safe thoracentesis. Risks outweigh the benefits. IMPRESSION: Small left-sided pleural effusion  with no safe window for percutaneous access Performed by: Kacie Matthews PA-C Electronically Signed   By: CHRISTELLA.  Shick M.D.   On: 05/20/2024 11:23    Medications:    (feeding supplement) PROSource Plus  30 mL Oral BID BM   amoxicillin -clavulanate  1 tablet Oral Q12H   apixaban   10 mg Oral BID   Followed by   [name] ON 05/26/2024] apixaban   5 mg Oral BID   atorvastatin   40 mg Oral Daily   carvedilol   25 mg Oral BID WC   Chlorhexidine  Gluconate Cloth  6 each Topical Q0600   cloNIDine   0.3 mg Transdermal Weekly   diltiazem   360 mg Oral Daily   feeding supplement  1 Container Oral BID BM   ferrous sulfate   325 mg Oral Daily   fidaxomicin   200 mg Oral BID   lactose free nutrition  237 mL Oral Daily   losartan   100 mg Oral Daily   mirtazapine   7.5 mg Oral QHS   multivitamin  1 tablet Oral QHS   pantoprazole   40 mg Oral Daily  sodium chloride  flush  10-40 mL Intracatheter Q12H   spironolactone   25 mg Oral Daily   thiamine   100 mg Oral Daily   [START ON 05/27/2024] vancomycin   125 mg Oral Daily    Dialysis Orders: MWF [facility]  3.5hrs BFR 400 DFR 500 EDW 53kg 2K/2.5Ca Heparin  bolus 600 units with HD then 400 units/hr Calcitriol  1.61mcg with HD - last dose 9/3 Sensipar 60mg  with HD - last dose 9/3 Micera 75mcg Q 4 weeks - per records, dose just ordered Calcium  Acetate 667mh TID with meals   Assessment/Plan: Colo-uterine fistula: per abd CT. Per surgery/ pmd - appears surgery has signed off and plans outpt elective management; ID recommended augmentin  as well Cdif colitis: per ID on fidaxomicin  LUE edema: LUE duplex ordered by Hospitalist - no issue with LUE but acute non occlusive RIJ clot noted - started on eliquis .  No issues with AVG thus far. Monitor. ESRD: on HD MWF. On schedule today HTN: HTN on 5 BP agents. Noted allergy to Amlodipine .  BP on lower side now, may need med reduction Volume: euvolemic on exam, no edema, CXR no edema. Didn't reach max UF 9/5 2nd  hypotension. Not eating much, limited UF with HD - even today. 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 Holding binders, VDRA, and Sensipar for now.  Dementia Malnutrition: has prosource between meals GOC: Palliative consulted - full scope at this time it appears Awaiting SNF  Manuelita Barters MD [facility] Pager [phone]

## 2024-05-22 NOTE — Progress Notes (Signed)
 Nutrition Follow-up  DOCUMENTATION CODES:   Severe malnutrition in context of chronic illness  INTERVENTION:  Initiate tube feeding via Cortrak once placed and confirmed: Osmolite 1.5 at 20 ml/h and increase by 10 ml every 8 hours until goal of 45 ml/hr (1080 ml per day) Prosource TF20 60 ml daily   Provides 1700 kcal, 87 gm protein, 822 ml free water  daily  Monitor magnesium , potassium, and phosphorus daily for at least 3 days, MD to replete as needed, as pt is at risk for refeeding syndrome  Banatrol TID per tube Encourage PO intake - Currently on Dysphagia 2, thin liquids  Nursing to assist with meals  Boost Breeze po BID, each supplement provides 250 kcal and 9 grams of protein Boost Plus daily , Each supplement provides 360 kcal and 14 gm protein Rena-Vite daily  100 mg Thiamine  daily    NUTRITION DIAGNOSIS:   Severe Malnutrition related to chronic illness as evidenced by severe muscle depletion, severe fat depletion, meal completion < 25%, per patient/family report, energy intake < or equal to 75% for > or equal to 1 month. - Ongoing   GOAL:   Patient will meet greater than or equal to 90% of their needs - Progressing with initiation of TF   MONITOR:   PO intake, Supplement acceptance, Labs, I & O's  REASON FOR ASSESSMENT:   Consult Assessment of nutrition requirement/status  ASSESSMENT:   Pt with PMH of dementia, ESRD on HD MWF, cervical ca, HTN, HLD, anemia, recent hospitalizations from 6/24 - 7/22 for hypertensive urgency and delirium, and 8/17 - 8/24 for Cdiff colitis now admitted from home with abd discomfort, weakness, Nausea, and diarrhea.  9/4 - CT of abd and pelvis showed rectosigmoid colon suspicious for colitis, colo-uterine fistula, increased bilateral pleural effusions, and progressive anasarca. Surgery, Dasie, consulted and recommends continued treatment for cdiff and outpt follow up and ok for diet advancement.  9/8 - Left pleural effusion too small  for Thoracocentesis, concern for malignancy. HD today  9/10 - Cortrak placed (needs to be confirmed by x-ray before use), TF to be started, HD today   Pt in HD today, son and brother at bedside. Discussed nutritional plan with son who is in agreement with cortrak tube being placed. Pt continues to have trouble chewing, pt chews solid food but is unable to swallow well. Able to swallow liquids fine. Not drinking supplements.   When tube feeds started pt will be at refeeding risk, monitor Mag, Phos, Potassium. Continue MVI and Thiamine .    Disposition: SNF  Admit weight: 57 kg -Fluid? Current weight: 56.9 kg - Fluid?  EDW: 53 kg   Average Meal Intake: 9/5-9/9: 6% intake x 4 recorded meals  Nutritionally Relevant Medications: Scheduled Meds:  feeding supplement  1 Container Oral BID BM   feeding supplement (PROSource TF20)  60 mL Per Tube Daily   ferrous sulfate   325 mg Oral Daily   fiber supplement (BANATROL TF)  60 mL Per Tube TID   fidaxomicin   200 mg Oral BID   lactose free nutrition  237 mL Oral Daily   losartan   100 mg Oral Daily   mirtazapine   7.5 mg Oral QHS   multivitamin  1 tablet Oral QHS   thiamine   100 mg Oral Daily   Labs Reviewed: Sodium 134 Potassium 3.2 BUN 24 Creatinine 3.77 Calcium  7.6 GFR 13 CBG ranges from 96-150 mg/dL over the last 24 hours HgbA1c 5.1  Diet Order:   Diet Order  DIET DYS 2 Room service appropriate? Yes; Fluid consistency: Thin  Diet effective now                   EDUCATION NEEDS:   Not appropriate for education at this time  Skin:  Skin Assessment: Reviewed RN Assessment  Last BM:  9/4 type 6; medium  Height:   Ht Readings from Last 1 Encounters:  05/16/24 5' 3 (1.6 m)    Weight:   Wt Readings from Last 1 Encounters:  05/22/24 56.9 kg   BMI:  Body mass index is 22.22 kg/m.  Estimated Nutritional Needs:   Kcal:  1600-1900 kcal  Protein:  80-100 gm  Fluid:  1 L + UOP   Olivia Kenning,  RD Registered Dietitian  See Amion for more information

## 2024-05-22 NOTE — TOC Progression Note (Signed)
 Transition of Care [facility]) - Progression Note    Patient Details  Name: Tracy Jennings MRN: [id number] Date of Birth: 1956-01-23  Transition of Care [facility]) CM/SW Contact  Kanaya Gunnarson LITTIE Moose, [place] Phone Number: 05/22/2024, 8:59 AM  Clinical Narrative:    CSW attempted to call pt son, sister and niece to complete SNF workup. None of them answered the phone but CSW was able to leave pt niece, April a voicemail. CSW will continue to follow.   Expected Discharge Plan: Skilled Nursing Facility Barriers to Discharge: Continued Medical Work up, English as a second language teacher, SNF Pending bed offer               Expected Discharge Plan and Services       Living arrangements for the past 2 months: Single Family Home                                       Social Drivers of Health (SDOH) Interventions SDOH Screenings   Food Insecurity: No Food Insecurity (05/16/2024)  Recent Concern: Food Insecurity - Food Insecurity Present (04/24/2024)   [facility] from [facility]  Housing: Low Risk  (05/16/2024)  Transportation Needs: No Transportation Needs (05/16/2024)  Recent Concern: Transportation Needs - Unmet Transportation Needs (04/24/2024)   Received from [facility]  Utilities: Not At Risk (05/16/2024)  Financial Resource Strain: High Risk (04/24/2024)   Received from [facility]  Social Connections: Unknown (05/16/2024)  Tobacco Use: Low Risk  (05/16/2024)    Readmission Risk Interventions     No data to display

## 2024-05-22 NOTE — NC FL2 (Signed)
 [place]  MEDICAID FL2 LEVEL OF CARE FORM     IDENTIFICATION  Patient Name: Tracy Jennings Birthdate: July 25, 1956 Sex: female Admission Date (Current Location): 05/16/2024  [facility] and [place] Number:  Producer, television/film/video and Address:  [facility] [place], 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   Anemia in ESRD (end-stage renal disease) (HCC) 03/05/2024   Myocardial injury 03/05/2024   Heart murmur, systolic 04/13/2022   Dementia with behavioral disturbance (HCC) 03/29/2022   Headache 03/29/2022   Hypertensive urgency 03/29/2022   Hypertensive emergency 03/28/2022   EKG, abnormal 03/28/2022   History of anesthesia reaction 07/06/2020   Failure to thrive in adult    Pressure injury of skin 06/27/2020   Thrush    Anemia of chronic disease    Palliative care by specialist    Generalized abdominal pain    Acute blood loss anemia    Intractable vomiting 06/21/2020   Hypokalemia 06/21/2020   Hyponatremia 06/21/2020   Hypomagnesemia 06/21/2020   Intractable nausea and vomiting 06/21/2020   Nausea vomiting and diarrhea 06/21/2020   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   Goals of care, counseling/discussion 03/20/2020   Cervical cancer (HCC) 03/11/2020   Orthostatic hypotension    Syncope 11/04/2019   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   Type 2 diabetes mellitus with other specified complication (HCC) 11/03/2019   Unable to care for self 11/03/2019   Essential hypertension 11/03/2019   Metabolic acidosis, increased anion gap (IAG)    Generalized weakness    Recurrent syncope 10/28/2019   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   Complication of vascular access for dialysis 05/11/2018   Hyperlipidemia 04/12/2018   Chronic recurrent pancreatitis (HCC) 11/01/2017   ESRD (end stage renal disease) (HCC) 11/01/2017   Recurrent pancreatitis 11/01/2017    Orientation RESPIRATION BLADDER Height & Weight     Self, Place  Normal Incontinent Weight: 125 lb 7.1 oz (56.9 kg) Height:  5' 3 (160 cm)  BEHAVIORAL SYMPTOMS/MOOD NEUROLOGICAL BOWEL NUTRITION STATUS      Incontinent Diet (See DC Summary)  AMBULATORY STATUS COMMUNICATION OF NEEDS Skin   Extensive Assist Verbally Normal                       Personal Care Assistance Level of Assistance  Bathing, Feeding, Dressing Bathing Assistance: Maximum assistance Feeding assistance: Limited assistance Dressing Assistance: Maximum assistance     Functional Limitations Info  Sight, Hearing, Speech Sight Info: Adequate Hearing Info: Adequate Speech Info: Adequate    SPECIAL CARE FACTORS FREQUENCY  PT (By licensed PT), OT (By licensed OT)     PT Frequency: 5x/week OT Frequency: 5x/week  Contractures Contractures Info: Not present    Additional Factors Info  Allergies, Code Status Code Status Info: Full Allergies Info: Amlodipine ; Hctz (hydrochlorothiazide); Heparin ; Lmw Heparin ; Sulfa Antibiotics; Lactose; Dairy Aid (tilactase); Hydralazine            Current Medications (05/22/2024):  This is the current  hospital active medication list Current Facility-Administered Medications  Medication Dose Route Frequency Provider Last Rate Last Admin   (feeding supplement) PROSource Plus liquid 30 mL  30 mL Oral BID BM Gonfa, Taye T, MD   30 mL at 05/22/24 0838   acetaminophen  (TYLENOL ) tablet 650 mg  650 mg Oral Q6H PRN Kakrakandy, Arshad N, MD   650 mg at 05/20/24 1544   Or   acetaminophen  (TYLENOL ) suppository 650 mg  650 mg Rectal Q6H PRN Franky Redia SAILOR, MD       amoxicillin -clavulanate (AUGMENTIN ) 500-125 MG per tablet 1 tablet  1 tablet Oral Q12H Dennise Kingsley, MD   1 tablet at 05/22/24 9161   apixaban  (ELIQUIS ) tablet 10 mg  10 mg Oral BID Uhlorn, Garett M, RPH   10 mg at 05/22/24 9161   Followed by   [name] ON 05/26/2024] apixaban  (ELIQUIS ) tablet 5 mg  5 mg Oral BID Marten Peat M, RPH       atorvastatin  (LIPITOR) tablet 40 mg  40 mg Oral Daily Kakrakandy, Arshad N, MD   40 mg at 05/22/24 9161   carvedilol  (COREG ) tablet 25 mg  25 mg Oral BID WC Kakrakandy, Arshad N, MD   25 mg at 05/22/24 9161   Chlorhexidine  Gluconate Cloth 2 % PADS 6 each  6 each Topical Q0600 Lenon Charmaine BRAVO, NP   6 each at 05/22/24 9386   cloNIDine  (CATAPRES  - Dosed in mg/24 hr) patch 0.3 mg  0.3 mg Transdermal Weekly Kakrakandy, Arshad N, MD   0.3 mg at 05/17/24 1127   diltiazem  (CARDIZEM  CD) 24 hr capsule 360 mg  360 mg Oral Daily Gonfa, Taye T, MD   360 mg at 05/22/24 9161   feeding supplement (BOOST / RESOURCE BREEZE) liquid 1 Container  1 Container Oral BID BM Gonfa, Taye T, MD   1 Container at 05/22/24 0840   ferrous sulfate  tablet 325 mg  325 mg Oral Daily Kakrakandy, Arshad N, MD   325 mg at 05/22/24 9161   fidaxomicin  (DIFICID ) tablet 200 mg  200 mg Oral BID Kakrakandy, Arshad N, MD   200 mg at 05/22/24 9161   hydrALAZINE  (APRESOLINE ) tablet 25 mg  25 mg Oral Q6H PRN Franky Redia SAILOR, MD       lactose free nutrition (BOOST PLUS) liquid 237 mL  237 mL Oral Daily Gonfa, Taye T, MD   237 mL at 05/21/24  1820   liver oil-zinc  oxide (DESITIN) 40 % ointment 1 Application  1 Application Topical PRN Daniels, James K, NP   1 Application at 05/22/24 0622   losartan  (COZAAR ) tablet 100 mg  100 mg Oral Daily Kakrakandy, Arshad N, MD   100 mg at 05/22/24 9161   mirtazapine  (REMERON ) tablet 7.5 mg  7.5 mg Oral QHS Gonfa, Taye T, MD   7.5 mg at 05/21/24 2300   multivitamin (RENA-VIT) tablet 1 tablet  1 tablet Oral QHS Kathrin Mignon DASEN, MD   1 tablet at 05/21/24 2301   Oral care mouth rinse  15 mL Mouth Rinse PRN Franky Redia SAILOR, MD       pantoprazole  (PROTONIX ) EC tablet 40 mg  40 mg Oral Daily Franky Redia SAILOR,  MD   40 mg at 05/22/24 0838   sodium chloride  flush (NS) 0.9 % injection 10-40 mL  10-40 mL Intracatheter Q12H Arthea Child, MD   10 mL at 05/22/24 0841   spironolactone  (ALDACTONE ) tablet 25 mg  25 mg Oral Daily Kakrakandy, Arshad N, MD   25 mg at 05/22/24 9161   thiamine  (VITAMIN B1) tablet 100 mg  100 mg Oral Daily Gonfa, Taye T, MD   100 mg at 05/22/24 0838   [START ON 05/27/2024] vancomycin  (VANCOCIN ) capsule 125 mg  125 mg Oral Daily Dennise Kingsley, MD         Discharge Medications: Please see discharge summary for a list of discharge medications.  Relevant Imaging Results:  Relevant Lab Results:   Additional Information SSN: [id number]  Jeoffrey LITTIE Moose, LCSWA

## 2024-05-22 NOTE — Progress Notes (Signed)
 Started Osmolite 1.5 at 20 ml/h and its to increase by 10 ml every 8 hours until goal of 45 ml/hr (1080 ml per day)

## 2024-05-22 NOTE — Progress Notes (Signed)
   05/22/24 1403  Vitals  Temp 97.9 F (36.6 C)  Temp Source Oral  BP (!) 118/46  MAP (mmHg) 69  BP Location Right Arm  BP Method Automatic  Patient Position (if appropriate) Lying  Pulse Rate 64  Pulse Rate Source Monitor  ECG Heart Rate 65  Resp 17  Level of Consciousness  Level of Consciousness Alert  MEWS COLOR  MEWS Score Color Green  Oxygen Therapy  SpO2 94 %  O2 Device Room Air  Pain Assessment  Pain Scale 0-10  Pain Score 0  MEWS Score  MEWS Temp 0  MEWS Systolic 0  MEWS Pulse 0  MEWS RR 0  MEWS LOC 0  MEWS Score 0

## 2024-05-22 NOTE — Procedures (Signed)
 Cortrak  Person Inserting Tube:  Mady Dolly, RD Tube Type:  Cortrak - 43 inches Tube Size:  10 Tube Location:  Left nare Secured by: Bridle Technique Used to Measure Tube Placement:  Marking at nare/corner of mouth Cortrak Secured At:  57 cm Initial Placement Verification:  Xray  Cortrak Tube Team Note:  Consult received to place a Cortrak feeding tube.   X-ray is required. RN may begin using tube post confirmation of appropriate placement.   If the tube becomes dislodged please keep the tube and contact the Cortrak team at [email or web address] for replacement.  If after hours and replacement cannot be delayed, place a NG tube and confirm placement with an abdominal x-ray.    Dolly Mady MS, RD, LDN Registered Dietitian Clinical Nutrition RD Inpatient Contact Info in Amion

## 2024-05-22 NOTE — Progress Notes (Signed)
 Pt arrived back from dialysis. Tele box 6N05 placed back on pt. Bed in lowest position. Floor mats beside bed. Family at bedside. Call light in reach. All needs met at this time.

## 2024-05-22 NOTE — Progress Notes (Signed)
 Pt transported to hemodialysis via bed by transportation staff

## 2024-05-22 NOTE — Progress Notes (Signed)
 Daily Progress Note   Date: 05/22/2024   Patient Name: Tracy Jennings  DOB: September 16, 1955  MRN: [id number]  Age / Sex: 68 y.o., female  Attending Physician: Tracy Sabas RAMAN, MD Primary Care Physician: Tracy Idelia LABOR, MD Admit Date: 05/16/2024 Length of Stay: 6 days  Reason for Follow-up: Establishing goals of care  Past Medical History:  Diagnosis Date   Anemia 04/2018   low iron. to be started on supplements   Cervical cancer (HCC)    CKD (chronic kidney disease)    Stage IV   Complication of anesthesia    receceived too much anesthesia, that she was in coma for a couple days    COVID-19 virus detected 10/28/2019   Diabetes mellitus without complication (HCC)    type II   ESRD (end stage renal disease) (HCC)    Heart murmur    followed as a child only   HSIL (high grade squamous intraepithelial lesion) on Pap smear of cervix    Hyperlipidemia associated with type 2 diabetes mellitus (HCC)    Hypertension    Pancreatitis    Peripheral vascular disease (HCC)     Subjective:   Subjective: Chart Reviewed. Updates received. Patient Assessed. Created space and opportunity for patient  and family to explore thoughts and feelings regarding current medical situation.  Today's Discussion: Today before meeting with the patient/family, I reviewed the chart notes including Family medicine note from today, TOC note from today, nephrology note from today. I also reviewed vital signs, nursing flowsheets, medication administrations record, labs, and imaging. Labs reviewed include renal function panel which shows hyponatremia at 134, hypokalemia at 3.2, creatinine elevated at 3.77 in the setting of ESRD with planned dialysis today.  Also albumin very low at less than 1.5 in the setting of chronic illness, malnutrition and progressive weight loss in recent months.  Today I received a call from family requesting assistance talking through nutrition issues and possible feeding tube.  In the morning  I went to the bedside but the patient had gone to hemodialysis and no family was present.  I went to hemodialysis to see the patient at bedside.  She states that she is hungry and would eat.  I shared that I would ask dialysis nurse to bring her something to eat.  She denies any other complaints, but overall is feeling not great.  Spoke with dialysis nurse she shares that she did provide her crackers but the patient did not eat them, they ended up spread around her bed.  She is attempting to obtain something for her to eat as well but they do not generally keep stock food.  Later in the afternoon I returned to the bedside and both the patient's brother and son Tracy Jennings were present.  We spent a significant amount of time talking about the patient with a specific focus on nutrition.  We talked a lot about recent significant weight loss, continued poor intake.  We discussed possible issues with swallowing versus cognitive impairment.  Family states the patient will chew her food quite a bit but did not swallow.  I discussed my gut feeling that is more of a cognitive issue versus a physical impairment.  Speech therapy is apparently coming to assess the patient shortly.  Dietitian has also discussed possible core track tubes.  At patient's family's request we discussed feeding tubes.  I spent time talk about the pros and cons of feeding tubes including temporary feeding tube such as a core track.  However,  if the patient does not remove her oral intake, even with the core track tube in place, this would likely need lead to discussion about permanent feeding tube.  The patient's brother does not seem so convinced that permanent feeding tube would be a good idea.  We discussed the pros and cons of both types of feeding tubes, both temporary and permanent.  At this time family seems open and agreeable to placing a core track temporary feeding tube and allowing time for outcomes.  There hopeful for improvement and for  the patient's appetite to pick up and her ability to increase her oral intake.  I shared that palliative medicine would remain involved in the patient's care and would likely need to follow-up periodically to check on improvement.  I shared that I would check back tomorrow to see how the patient is doing and confirm core track placement.  I provided our contact information for any questions or concerns the patient's family may have in the interim.  I provided emotional and general support through therapeutic listening, empathy, sharing of stories, therapeutic touch, and other techniques. I answered all questions and addressed all concerns to the best of my ability.  Review of Systems  Objective:   Primary Diagnoses: Present on Admission:  Dementia with behavioral disturbance (HCC)  Chronic recurrent pancreatitis (HCC)  ESRD (end stage renal disease) (HCC)  Type 2 diabetes mellitus with other specified complication (HCC)  Essential hypertension  Cervical cancer (HCC)  C. difficile colitis  PVD (peripheral vascular disease) (HCC)  Fistula   Vital Signs:  BP (!) 121/50   Pulse 64   Temp 98.2 F (36.8 C)   Resp (!) 23   Ht 5' 3 (1.6 m)   Wt 56.9 kg   LMP  (LMP Unknown)   SpO2 99%   BMI 22.22 kg/m   Physical Exam  Palliative Assessment/Data: 20%   Advanced Care Planning:   Existing Vynca/ACP Documentation: None  Primary Decision Maker: NEXT OF KIN  Pertinent diagnosis: ESRD, severe protein calorie malnutrition, C. difficile colitis, Colouterine fistula, persistent leukocytosis, loculated pleural effusion dementia, failure to thrive  The patient and/or family consented to a voluntary Advance Care Planning Conversation in person. Individuals present for the conversation: Patient, patient's son Tracy Jennings, patient's brother, Tracy Kays, NP  Summary of the conversation: We discussed patient's clinical situation, difficulties with nutrition, possible etiologies by nutrition  difficulties, options including temporary feeding tube, possible need for further discussions around permanent feeding tube if intake does not improve.  Outcome of the conversations and/or documents completed: Remain full code, full scope of care.  Open to placing core track temporary feeding tube, time for outcomes, ongoing goals of care.  I spent 30 minutes providing separately identifiable ACP services with the patient and/or surrogate decision maker in a voluntary, in-person conversation discussing the patient's wishes and goals as detailed in the above note.  Assessment & Plan:   HPI/Patient Profile:  68 y.o. female  with past medical history of cognitive impairment, ESRD on HD MWF, cervical cancer, HTN, HLD, anemia and recent hospitalizations from 6/24-7/22 for hypertensive urgency and delirium, and 8/17-8/24 for C. difficile colitis for which she was treated with p.o. vancomycin   admitted on 05/16/2024 with weakness, nausea, diarrhea and abdominal discomfort. CT abdomen and pelvis showed circumferential wall thickening of rectosigmoid colon suspicious for colitis, Colo uterine fistula, increased bilateral pleural effusions, left > right and area of rounded density in anterior LLL measuring 3.1 cm, and progressive anasarca.  General surgery  at [facility] was consulted and recommended higher level of care and the ER physician discussed with Dr. Dasie general surgeon at [facility] who agreed to see patient in consult. Patient was started on Dificid  and Flagyl  for C. difficile colitis.  Evaluated by general surgery - urgent surgical intervention is not indicated. PMT consulted to discuss GOC.   SUMMARY OF RECOMMENDATIONS   Full code Full scope of care Open to temporary feeding tube placement Time for outcomes Ongoing goals of care pending evolution of clinical picture Palliative medicine will continue to follow  Symptom Management:  Per primary team Palliative medicine is available to assist as  needed  Code Status: Full Code  Prognosis: Unable to determine  Discharge Planning: To Be Determined  Discussed with: Patient's family, medical team, nursing team  Thank you for allowing us  to participate in the care of KAMARRI FISCHETTI PMT will continue to support holistically.  Billing based on MDM: High  Problems Addressed: One or more chronic illnesses with severe exacerbation, progression, or side effects of treatment.  Amount and/or Complexity of Data: Category 1:Review of prior external note(s) from each unique source, Review of the result(s) of each unique test, and Assessment requiring an independent historian(s) and Category 3:Discussion of management or test interpretation with external physician/other qualified health care professional/appropriate source (not separately reported)  Risks: N/A  Detailed review of medical records (labs, imaging, vital signs), medically appropriate exam, discussed with treatment team, counseling and education to patient, family, & staff, documenting clinical information, medication management, coordination of care  Tracy Kays, NP Palliative Medicine Team  Team Phone # [phone] (Nights/Weekends)  05/11/2021, 8:17 AM

## 2024-05-22 NOTE — Progress Notes (Signed)
 [facility]  PROGRESS NOTE  Tracy Jennings FMW:[id number] DOB: December 12, 1955 DOA: 05/16/2024 PCP: Zachary Idelia LABOR, MD   Brief HPI:   68 year old F with PMH of cognitive impairment, ESRD on HD MWF, cervical cancer, HTN, HLD, anemia and recent hospitalizations from 6/24-7/22 for hypertensive urgency and delirium, and 8/17-8/24 for C. difficile colitis for which she was treated with p.o. vancomycin  brought back to [facility] from PCP office due to weakness, nausea, diarrhea and abdominal discomfort.   ID consulted for persistent leukocytosis and started Augmentin  and ordered CT chest that showed small slightly loculated appearing left pleural effusion, subpleural rounded consolidation at lingula, ovoid masslike consolidation at the anterior left lung base contagious with curvilinear consolidation posteriorly suggesting atelectasis, patchy areas of mild bilateral ground glass density and anasarca.     Assessment/Plan:   C. difficile colitis: Completed 10 days of oral vancomycin  about a week ago.  Ongoing abdominal pain and diarrhea.  CT suggested rectosigmoid colitis.  She has loose stool but not frequently.  Abdominal exam benign.  Still with significant leukocytosis - Continue Dificid  - Continue soft diet. As per ID recommendation  -Continue Augmentin  to complete 10 days on antibiotics for possible pneumonia with possible or loculation EOT 9/17.  Patient reports minimal respiratory symptoms - Complete Dificid  x 10 days then vancomycin  p.o. daily 7 days out form completing Augmentin .  Colouterine fistula-reason for transfer from [facility] to [facility].  Abdominal exam benign. - General Surgery recommends elective management and treating C. difficile. - Continue soft diet.   Persistent leukocytosis/bandemia: Persisted despite treatment of C. difficile.  Has no respiratory symptoms but chronic left pleural effusion which was felt to be small to tap.  Left forearm swelling but no signs of infection.  Blood  culture negative on 9/4. Incidental right IJ acute DVT.  CT showed small slightly loculated appearing left pleural effusion and masslike consolidations as above. -Appreciate help by ID-started p.o. Augmentin  -Encourage incentive telemetry -Needs repeat CT in 4 to 6 weeks for LLL mass.   Loculated left pleural effusion/masslike consolidation: Seems chronic.  Has no respiratory symptoms.  IR consulted for thoracocentesis but pleural effusion felt to be small to tap safely. CT chest finding as above.   - Now on p.o. Augmentin  per ID - Encourage incentive telemetry - Needs repeat CT in 4 to 6 weeks   Dementia without behavioral disturbance: Oriented to self and family. Failure to thrive/poor appetite/unintentional weight loss: family reports significant weight loss since her hospitalization in July 2025.  Not able to quantify.  Appetite remains poor even after addition of low-dose Remeron .  Dietitian recommends tube feed -Continue low-dose Remeron  -Appreciate input by dietitian-recommends cortrack and tube feed. -Appreciate input by palliative-full code with full scope of care per family   Uncontrolled hypertension: Improved  -Continue home clonidine  patch, losartan , Coreg , hydralazine  and Aldactone  -Cardizem  increased from 180 mg daily to 360 mg daily on 9/5 -Ultrafiltration per nephrology. -Continue monitoring   Acute right IJ DVT: Incidental finding.  Venous Doppler was ordered for left forearm swelling but showed acute right IJ DVT. - Started on Eliquis    ESRD on HD MWF-last HD on Wednesday.  No emergent need. - Per nephrology.   Anemia of renal disease: H&H relatively stable.  No overt bleeding such as melena or hematochezia.      Physical deconditioning - Therapy recommended SNF.  Seems family is in agreement now   History of uterine cancer: - Outpatient follow-up.   Hypokalemia -Monitor replenish K and Mg as  appropriate   Severe malnutrition/failure to  thrive/hypoalbuminemia Body mass index is 22.77 kg/m. Nutrition Problem: Severe Malnutrition Etiology: chronic illness Signs/Symptoms: severe muscle depletion, severe fat depletion, meal completion < 25%, per patient/family report, energy intake < or equal to 75% for > or equal to 1 month Interventions: Refer to RD note for recommendations    Medications     (feeding supplement) PROSource Plus  30 mL Oral BID BM   amoxicillin -clavulanate  1 tablet Oral Q12H   apixaban   10 mg Oral BID   Followed by   [name] ON 05/26/2024] apixaban   5 mg Oral BID   atorvastatin   40 mg Oral Daily   carvedilol   25 mg Oral BID WC   Chlorhexidine  Gluconate Cloth  6 each Topical Q0600   cloNIDine   0.3 mg Transdermal Weekly   diltiazem   360 mg Oral Daily   feeding supplement  1 Container Oral BID BM   ferrous sulfate   325 mg Oral Daily   fidaxomicin   200 mg Oral BID   lactose free nutrition  237 mL Oral Daily   losartan   100 mg Oral Daily   mirtazapine   7.5 mg Oral QHS   multivitamin  1 tablet Oral QHS   pantoprazole   40 mg Oral Daily   sodium chloride  flush  10-40 mL Intracatheter Q12H   spironolactone   25 mg Oral Daily   thiamine   100 mg Oral Daily   [START ON 05/27/2024] vancomycin   125 mg Oral Daily     Data Reviewed:   CBG:  Recent Labs  Lab 05/20/24 2141 05/21/24 0826 05/21/24 1150 05/21/24 1707 05/21/24 2243  GLUCAP 91 110* 125* 150* 135*    SpO2: 100 %    Vitals:   05/21/24 0835 05/21/24 1537 05/21/24 1959 05/22/24 0600  BP: 129/62 (!) 100/45 107/60 (!) 115/56  Pulse:  60 83 (!) 59  Resp:  16 18 16   Temp:  97.9 F (36.6 C) 98.2 F (36.8 C) (!) 97 F (36.1 C)  TempSrc:  Oral Oral Oral  SpO2:  99% 97% 100%  Weight:      Height:          Data Reviewed:  Basic Metabolic Panel: Recent Labs  Lab 05/18/24 0305 05/19/24 0440 05/20/24 0555 05/21/24 1125 05/22/24 0314  NA 136 136 134* 137 134*  K 3.5 3.3* 3.4* 3.5 3.2*  CL 98 98 97* 97* 98  CO2 25 25 25 27 27    GLUCOSE 116* 147* 110* 139* 122*  BUN 12 22 28* 19 24*  CREATININE 2.99* 3.87* 4.54* 3.27* 3.77*  CALCIUM  8.0* 8.1* 7.8* 7.7* 7.6*  MG 1.8 2.0  --  1.7 1.7  PHOS 2.2* 3.1 3.4 2.7 2.9    CBC: Recent Labs  Lab 05/16/24 1308 05/17/24 0047 05/18/24 0305 05/19/24 0440 05/20/24 0555 05/21/24 1125 05/22/24 0314  WBC 21.7* 21.7* 20.5* 21.4* 19.7* 21.7* 22.7*  NEUTROABS 20.2* 21.3* 19.5* 20.8* 18.5*  --   --   HGB 8.3* 9.8* 8.1* 8.6* 8.4* 7.6* 7.4*  HCT 26.6* 31.7* 26.1* 27.7* 27.4* 25.0* 23.8*  MCV 97.4 97.8 96.3 96.2 95.8 97.7 97.1  PLT 252 266 258 263 258 227 201    LFT Recent Labs  Lab 05/16/24 1308 05/17/24 0047 05/18/24 0305 05/19/24 0440 05/20/24 0555 05/20/24 1010 05/21/24 1125 05/22/24 0314  AST 12* 13*  --   --   --   --   --   --   ALT 7 8  --   --   --   --   --   --  ALKPHOS 55 66  --   --   --   --   --   --   BILITOT 1.3* 1.1  --   --   --   --   --   --   PROT 5.8* 6.0*  --   --   --  5.7*  --   --   ALBUMIN 2.0* 1.9* 1.7* 1.6* 1.6*  --  1.6* <1.5*     Antibiotics: Anti-infectives (From admission, onward)    Start     Dose/Rate Route Frequency Ordered Stop   05/27/24 1000  vancomycin  (VANCOCIN ) capsule 125 mg        125 mg Oral Daily 05/21/24 1019 06/04/24 0959   05/20/24 1530  amoxicillin -clavulanate (AUGMENTIN ) 500-125 MG per tablet 1 tablet        1 tablet Oral Every 12 hours 05/20/24 1519 05/30/24 0959   05/17/24 0100  fidaxomicin  (DIFICID ) tablet 200 mg        200 mg Oral 2 times daily 05/17/24 0003 05/26/24 2159   05/17/24 0100  metroNIDAZOLE  (FLAGYL ) IVPB 500 mg  Status:  Discontinued        500 mg 100 mL/hr over 60 Minutes Intravenous Every 12 hours 05/17/24 0003 05/20/24 1519        DVT prophylaxis: Apixaban   Code Status: Full code  Family Communication: Discussed with patient's son at bedside   CONSULTS nephrology   Subjective   Denies any pain   Objective    Physical Examination:   Appears lethargic S1-S2,  regular Lungs clear to auscultation bilaterally Abdomen is soft, nontender, no organomegaly  Status is: Inpatient:             Sabas GORMAN Brod   [facility] If 7PM-7AM, please contact night-coverage at [email or web address], Office  [phone]   05/22/2024, 8:18 AM  LOS: 6 days

## 2024-05-23 DIAGNOSIS — Z7189 Other specified counseling: Secondary | ICD-10-CM | POA: Diagnosis not present

## 2024-05-23 DIAGNOSIS — A0472 Enterocolitis due to Clostridium difficile, not specified as recurrent: Secondary | ICD-10-CM | POA: Diagnosis not present

## 2024-05-23 DIAGNOSIS — Z515 Encounter for palliative care: Secondary | ICD-10-CM | POA: Diagnosis not present

## 2024-05-23 DIAGNOSIS — N824 Other female intestinal-genital tract fistulae: Secondary | ICD-10-CM | POA: Diagnosis not present

## 2024-05-23 DIAGNOSIS — F03918 Unspecified dementia, unspecified severity, with other behavioral disturbance: Secondary | ICD-10-CM | POA: Diagnosis not present

## 2024-05-23 DIAGNOSIS — I10 Essential (primary) hypertension: Secondary | ICD-10-CM | POA: Diagnosis not present

## 2024-05-23 LAB — BASIC METABOLIC PANEL WITH GFR
Anion gap: 13 (ref 5–15)
BUN: 16 mg/dL (ref 8–23)
CO2: 27 mmol/L (ref 22–32)
Calcium: 7.5 mg/dL — ABNORMAL LOW (ref 8.9–10.3)
Chloride: 97 mmol/L — ABNORMAL LOW (ref 98–111)
Creatinine, Ser: 2.64 mg/dL — ABNORMAL HIGH (ref 0.44–1.00)
GFR, Estimated: 19 mL/min — ABNORMAL LOW (ref >60)
Glucose, Bld: 225 mg/dL — ABNORMAL HIGH (ref 70–99)
Potassium: 3.4 mmol/L — ABNORMAL LOW (ref 3.5–5.1)
Sodium: 137 mmol/L (ref 135–145)

## 2024-05-23 LAB — GLUCOSE, CAPILLARY
Glucose-Capillary: 221 mg/dL — ABNORMAL HIGH (ref 70–99)
Glucose-Capillary: 219 mg/dL — ABNORMAL HIGH (ref 70–99)

## 2024-05-23 LAB — HEPATITIS B SURFACE ANTIBODY, QUANTITATIVE: Hep B S AB Quant (Post): 2410 m[IU]/mL

## 2024-05-23 NOTE — Progress Notes (Signed)
 [facility] Progress Note   Subjective:   Seen and examined patient at bedside.  Cortrak has been inserted and TF initiated.   Doesn't offer much history but denies dyspnea, says diarrhea improved.  No family bedside this AM  Objective Vitals:   05/22/24 1315 05/22/24 1403 05/22/24 2029 05/23/24 0330  BP:  (!) 118/46 (!) 110/47 (!) 114/44  Pulse:  64 65 65  Resp:  17 18 16   Temp:  97.9 F (36.6 C) 98.2 F (36.8 C) 98.5 F (36.9 C)  TempSrc:  Oral Oral Oral  SpO2: 92% 94% 94% (!) 86%  Weight:      Height:       Physical Exam General: Awake, chronically ill-appearing; thin, on RA, NAD Heart: S1 and S2; No murmurs, gallops, rubs Lungs: Clear anteriorly Extremities: No LE edema Dialysis Access: L AVG +t/b  Filed Weights   05/17/24 1938 05/20/24 1306 05/22/24 0926  Weight: 58.6 kg 58.3 kg 56.9 kg    Intake/Output Summary (Last 24 hours) at 05/23/2024 0845 Last data filed at 05/23/2024 0400 Gross per 24 hour  Intake 451.5 ml  Output 500 ml  Net -48.5 ml     Additional Objective Labs: Basic Metabolic Panel: Recent Labs  Lab 05/20/24 0555 05/21/24 1125 05/22/24 0314 05/23/24 0346  NA 134* 137 134* 137  K 3.4* 3.5 3.2* 3.4*  CL 97* 97* 98 97*  CO2 25 27 27 27   GLUCOSE 110* 139* 122* 225*  BUN 28* 19 24* 16  CREATININE 4.54* 3.27* 3.77* 2.64*  CALCIUM  7.8* 7.7* 7.6* 7.5*  PHOS 3.4 2.7 2.9  --    Liver Function Tests: Recent Labs  Lab 05/16/24 1308 05/17/24 0047 05/18/24 0305 05/20/24 0555 05/20/24 1010 05/21/24 1125 05/22/24 0314  AST 12* 13*  --   --   --   --   --   ALT 7 8  --   --   --   --   --   ALKPHOS 55 66  --   --   --   --   --   BILITOT 1.3* 1.1  --   --   --   --   --   PROT 5.8* 6.0*  --   --  5.7*  --   --   ALBUMIN 2.0* 1.9*   < > 1.6*  --  1.6* <1.5*   < > = values in this interval not displayed.   Recent Labs  Lab 05/16/24 1308  LIPASE 22   CBC: Recent Labs  Lab 05/18/24 0305 05/19/24 0440 05/20/24 0555  05/21/24 1125 05/22/24 0314  WBC 20.5* 21.4* 19.7* 21.7* 22.7*  NEUTROABS 19.5* 20.8* 18.5*  --   --   HGB 8.1* 8.6* 8.4* 7.6* 7.4*  HCT 26.1* 27.7* 27.4* 25.0* 23.8*  MCV 96.3 96.2 95.8 97.7 97.1  PLT 258 263 258 227 201   Blood Culture    Component Value Date/Time   SDES BLOOD BLOOD RIGHT HAND 05/16/2024 1643   SPECREQUEST  05/16/2024 1643    BOTTLES DRAWN AEROBIC AND ANAEROBIC BOTTLES DRAWN AEROBIC ONLY   CULT  05/16/2024 1643    NO GROWTH 4 DAYS Performed at [facility], [address] [place], [place] [address]    REPTSTATUS PENDING 05/16/2024 1643    Cardiac Enzymes: No results for input(s): CKTOTAL, CKMB, CKMBINDEX, TROPONINI in the last 168 hours. CBG: Recent Labs  Lab 05/21/24 1707 05/21/24 2243 05/22/24 0833 05/22/24 1349 05/22/24 2023  GLUCAP 150* 135*  119* 96 150*   Iron Studies: No results for input(s): IRON, TIBC, TRANSFERRIN, FERRITIN in the last 72 hours. Lab Results  Component Value Date   INR 1.3 (H) 03/05/2024   INR 1.1 11/03/2019   INR 1.04 05/12/2018   Studies/Results: DG Abd Portable 1V Result Date: 05/22/2024 CLINICAL DATA:  [id number] Encounter for feeding tube placement [id number] EXAM: PORTABLE ABDOMEN - 1 VIEW COMPARISON:  05/16/2024. FINDINGS: The bowel gas pattern is non-obstructive. No evidence of pneumoperitoneum, within the limitations of a supine film. No acute osseous abnormalities. There is small left pleural effusion blunting the left lateral costophrenic angle. No significant right pleural effusion. Surgical changes, devices, tubes and lines: Weighted Dobbhoff tube noted coursing below the left hemidiaphragm with its tip overlying the left lower abdomen, over the region of distal stomach. Right-sided CT Port-A-Cath is seen with its tip overlying the lower portion of superior vena cava. IMPRESSION: *Weighted Dobbhoff tube noted coursing below the left hemidiaphragm with its tip overlying the left lower abdomen, over  the region of distal stomach. Electronically Signed   By: Ree Molt M.D.   On: 05/22/2024 15:35    Medications:  feeding supplement (OSMOLITE 1.5 CAL) 1,000 mL (05/22/24 1758)     amoxicillin -clavulanate  1 tablet Oral Q12H   apixaban   10 mg Oral BID   Followed by   [name] ON 05/26/2024] apixaban   5 mg Oral BID   atorvastatin   40 mg Oral Daily   carvedilol   25 mg Oral BID WC   Chlorhexidine  Gluconate Cloth  6 each Topical Q0600   cloNIDine   0.3 mg Transdermal Weekly   diltiazem   360 mg Oral Daily   feeding supplement  1 Container Oral BID BM   feeding supplement (PROSource TF20)  60 mL Per Tube Daily   ferrous sulfate   325 mg Oral Daily   fiber supplement (BANATROL TF)  60 mL Per Tube TID   fidaxomicin   200 mg Oral BID   lactose free nutrition  237 mL Oral Daily   losartan   100 mg Oral Daily   mirtazapine   7.5 mg Oral QHS   multivitamin  1 tablet Oral QHS   pantoprazole   40 mg Oral Daily   sodium chloride  flush  10-40 mL Intracatheter Q12H   spironolactone   25 mg Oral Daily   thiamine   100 mg Oral Daily   [START ON 05/27/2024] vancomycin   125 mg Oral Daily    Dialysis Orders: MWF [facility]  3.5hrs BFR 400 DFR 500 EDW 53kg 2K/2.5Ca Heparin  bolus 600 units with HD then 400 units/hr Calcitriol  1.36mcg with HD - last dose 9/3 Sensipar 60mg  with HD - last dose 9/3 Micera 75mcg Q 4 weeks - per records, dose just ordered Calcium  Acetate 667mh TID with meals   Assessment/Plan: Colo-uterine fistula: per abd CT. Per surgery/ pmd - appears surgery has signed off and plans outpt elective management; ID recommended augmentin  as well Cdif colitis: per ID on fidaxomicin  LUE edema: LUE duplex ordered by Hospitalist - no issue with LUE but acute non occlusive RIJ clot noted - started on eliquis .  No issues with AVG thus far. Monitor. ESRD: on HD MWF. Next 9/12 HTN: HTN on 5 BP agents. Noted allergy to Amlodipine .  BP on lower side now, d/c spironolactone  Volume:  euvolemic on exam, no edema, CXR no edema. 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 Holding binders, VDRA, and Sensipar for now.  Dementia Malnutrition: had  cortrak inserted and TF started 9/10 GOC: Palliative consulted - full scope at this time it appears  Awaiting SNF  Manuelita Barters MD [facility] Pager [phone]

## 2024-05-23 NOTE — Plan of Care (Signed)
  Problem: Pain Managment: Goal: General experience of comfort will improve and/or be controlled Outcome: Progressing   Problem: Safety: Goal: Ability to remain free from injury will improve Outcome: Progressing   Problem: Skin Integrity: Goal: Risk for impaired skin integrity will decrease Outcome: Progressing

## 2024-05-23 NOTE — TOC Progression Note (Signed)
 Transition of Care [facility]) - Progression Note    Patient Details  Name: Tracy Jennings MRN: [id number] Date of Birth: 09/20/1955  Transition of Care [facility]) CM/SW Contact  Swayzee Wadley LITTIE Moose, [place] Phone Number: 05/23/2024, 2:13 PM  Clinical Narrative:    CSW emailed pt son, [name] to touch base and inform him that SNF referrals had not been sent out yet due to pt having a cortrak placed. CSW will continue to follow.    Expected Discharge Plan: Skilled Nursing Facility Barriers to Discharge: Continued Medical Work up, English as a second language teacher, SNF Pending bed offer               Expected Discharge Plan and Services       Living arrangements for the past 2 months: Single Family Home                                       Social Drivers of Health (SDOH) Interventions SDOH Screenings   Food Insecurity: No Food Insecurity (05/16/2024)  Recent Concern: Food Insecurity - Food Insecurity Present (04/24/2024)   Received from [facility]  Housing: Low Risk  (05/16/2024)  Transportation Needs: No Transportation Needs (05/16/2024)  Recent Concern: Transportation Needs - Unmet Transportation Needs (04/24/2024)   Received from [facility]  Utilities: Not At Risk (05/16/2024)  Financial Resource Strain: High Risk (04/24/2024)   Received from [facility]  Social Connections: Unknown (05/16/2024)  Tobacco Use: Low Risk  (05/16/2024)    Readmission Risk Interventions     No data to display

## 2024-05-23 NOTE — Plan of Care (Signed)
  Problem: Pain Managment: Goal: General experience of comfort will improve and/or be controlled Outcome: Progressing   Problem: Safety: Goal: Ability to remain free from injury will improve Outcome: Progressing

## 2024-05-23 NOTE — Progress Notes (Signed)
 Daily Progress Note   Date: 05/23/2024   Patient Name: Tracy Jennings  DOB: July 17, 1956  MRN: [id number]  Age / Sex: 68 y.o., female  Attending Physician: Drusilla Sabas RAMAN, MD Primary Care Physician: Zachary Idelia LABOR, MD Admit Date: 05/16/2024 Length of Stay: 7 days  Reason for Follow-up: Establishing goals of care  Past Medical History:  Diagnosis Date   Anemia 04/2018   low iron. to be started on supplements   Cervical cancer (HCC)    CKD (chronic kidney disease)    Stage IV   Complication of anesthesia    receceived too much anesthesia, that she was in coma for a couple days    COVID-19 virus detected 10/28/2019   Diabetes mellitus without complication (HCC)    type II   ESRD (end stage renal disease) (HCC)    Heart murmur    followed as a child only   HSIL (high grade squamous intraepithelial lesion) on Pap smear of cervix    Hyperlipidemia associated with type 2 diabetes mellitus (HCC)    Hypertension    Pancreatitis    Peripheral vascular disease (HCC)     Subjective:   Subjective: Chart Reviewed. Updates received. Patient Assessed. Created space and opportunity for patient  and family to explore thoughts and feelings regarding current medical situation.  Today's Discussion: Today before meeting with the patient/family, I reviewed the chart notes including social work note from yesterday, nursing notes from yesterday, dietitian notes from yesterday including core track placement note, nursing note from today, family medicine note from today, nephrology note from today, TOC/social worker note from today. I also reviewed vital signs, nursing flowsheets, medication administrations record, labs, and imaging. Labs reviewed include BMP which shows improved sodium at 137, hypokalemia at 3. 4 (improved), creatinine elevated at 2.64 after dialysis yesterday in the setting of ESRD.  Today I saw the patient at bedside, her brother was present.  The patient is sleeping soundly and I  elected to not wake her.  Her brother provided history to assist with the assessment. Her son was not present.  We discussed that core track was placed yesterday, and I reminded him that this is a temporary feeding tube to see if they can help her with strengthening and any improvement.  He is hopeful that if she is able to work with therapy and move more with better nutrition that she may improve her appetite.  He notes that it seems like her appetite has improved with the appetite stimulant over the past 24 to 48 hours.  This morning he helped to feed her and feels that she ate a decent amount.  However, he notes that it did require persistent encouragement from him.  At the end of our conversation we agreed to allow some time for outcomes, check in a few days to see how she is doing.  I stated that if she does not improve her oral intake that feeding tube conversations may have to shift to a more permanent feeding tube such as a PEG tube.  I shared that it appeared family did not seem to be very keen on this idea, but we would have that conversation if we needed to increase days.  He is in agreement.  I provided emotional and general support through therapeutic listening, empathy, sharing of stories, therapeutic touch, and other techniques. I answered all questions and addressed all concerns to the best of my ability.  Review of Systems  Unable to perform ROS  Objective:   Primary Diagnoses: Present on Admission:  Dementia with behavioral disturbance (HCC)  Chronic recurrent pancreatitis (HCC)  ESRD (end stage renal disease) (HCC)  Type 2 diabetes mellitus with other specified complication (HCC)  Essential hypertension  Cervical cancer (HCC)  C. difficile colitis  PVD (peripheral vascular disease) (HCC)  Fistula   Vital Signs:  BP (!) 112/52 (BP Location: Right Arm)   Pulse 66   Temp 98.2 F (36.8 C) (Oral)   Resp 16   Ht 5' 3 (1.6 m)   Wt 56.9 kg   LMP  (LMP Unknown)   SpO2 92%    BMI 22.22 kg/m   Physical Exam Vitals and nursing note reviewed.  Constitutional:      General: She is sleeping. She is not in acute distress.    Appearance: She is ill-appearing.  HENT:     Head: Normocephalic and atraumatic.  Cardiovascular:     Rate and Rhythm: Normal rate.  Pulmonary:     Effort: Pulmonary effort is normal. No respiratory distress.  Abdominal:     General: Abdomen is flat. There is no distension.  Skin:    General: Skin is warm and dry.     Palliative Assessment/Data: 20-30% (with CorTrak in place)   Assessment & Plan:   HPI/Patient Profile:  68 y.o. female  with past medical history of cognitive impairment, ESRD on HD MWF, cervical cancer, HTN, HLD, anemia and recent hospitalizations from 6/24-7/22 for hypertensive urgency and delirium, and 8/17-8/24 for C. difficile colitis for which she was treated with p.o. vancomycin   admitted on 05/16/2024 with weakness, nausea, diarrhea and abdominal discomfort. CT abdomen and pelvis showed circumferential wall thickening of rectosigmoid colon suspicious for colitis, Colo uterine fistula, increased bilateral pleural effusions, left > right and area of rounded density in anterior LLL measuring 3.1 cm, and progressive anasarca.  General surgery at [facility] was consulted and recommended higher level of care and the ER physician discussed with Dr. Dasie general surgeon at [facility] who agreed to see patient in consult. Patient was started on Dificid  and Flagyl  for C. difficile colitis.  Evaluated by general surgery - urgent surgical intervention is not indicated. PMT consulted to discuss GOC.   SUMMARY OF RECOMMENDATIONS   Full code Full scope of care Time for outcomes and other core track has been placed May need to discuss permanent feeding tube/PEG tube in the coming days Ongoing goals of care pending evolution of clinical picture Palliative medicine will follow-up in a few days  Symptom Management:  Per primary  team Palliative medicine is available to assist as needed  Code Status: Full Code  Prognosis: Unable to determine  Discharge Planning: To Be Determined  Discussed with: Patient's family, medical team, nursing team  Thank you for allowing us  to participate in the care of SHIREL MALLIS PMT will continue to support holistically.  Billing based on MDM: High  Problems Addressed: One or more chronic illnesses with severe exacerbation, progression, or side effects of treatment.  Amount and/or Complexity of Data: Category 1:Review of prior external note(s) from each unique source, Review of the result(s) of each unique test, and Assessment requiring an independent historian(s) and Category 3:Discussion of management or test interpretation with external physician/other qualified health care professional/appropriate source (not separately reported)  Risks: N/A  Detailed review of medical records (labs, imaging, vital signs), medically appropriate exam, discussed with treatment team, counseling and education to patient, family, & staff, documenting clinical information, medication management, coordination of  care  [name], NP Palliative Medicine Team  Team Phone # [phone] (Nights/Weekends)  05/11/2021, 8:17 AM

## 2024-05-23 NOTE — Progress Notes (Signed)
 [facility]  PROGRESS NOTE  Tracy Jennings FMW:[id number] DOB: [birth year] DOA: 05/16/2024 PCP: Zachary Idelia LABOR, MD   Brief HPI:   68 year old F with PMH of cognitive impairment, ESRD on HD MWF, cervical cancer, HTN, HLD, anemia and recent hospitalizations from 6/24-7/22 for hypertensive urgency and delirium, and 8/17-8/24 for C. difficile colitis for which she was treated with p.o. vancomycin  brought back to [facility] from PCP office due to weakness, nausea, diarrhea and abdominal discomfort.   ID consulted for persistent leukocytosis and started Augmentin  and ordered CT chest that showed small slightly loculated appearing left pleural effusion, subpleural rounded consolidation at lingula, ovoid masslike consolidation at the anterior left lung base contagious with curvilinear consolidation posteriorly suggesting atelectasis, patchy areas of mild bilateral ground glass density and anasarca.     Assessment/Plan:   C. difficile colitis: Completed 10 days of oral vancomycin  about a week ago.  Ongoing abdominal pain and diarrhea.  CT suggested rectosigmoid colitis.  She has loose stool but not frequently.  Abdominal exam benign.  Still with significant leukocytosis - Continue Dificid  - Continue soft diet. As per ID recommendation  -Continue Augmentin  to complete 10 days on antibiotics for possible pneumonia with possible or loculation EOT 9/17.  Patient reports minimal respiratory symptoms - Complete Dificid  x 10 days then vancomycin  p.o. daily 7 days out form completing Augmentin .  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  Abdominal exam benign. - General Surgery recommends elective management and treating C. difficile. - Continue soft diet.   Persistent leukocytosis/bandemia: Persisted despite treatment of C. difficile.  Has no respiratory symptoms but chronic left pleural effusion which was felt to be small to tap.  Left forearm swelling but no signs of infection.  Blood  culture negative on 9/4. Incidental right IJ acute DVT.  CT showed small slightly loculated appearing left pleural effusion and masslike consolidations as above. -Appreciate help by ID-started p.o. Augmentin  -Encourage incentive telemetry -Needs repeat CT in 4 to 6 weeks for LLL mass.   Loculated left pleural effusion/masslike consolidation: Seems chronic.  Has no respiratory symptoms.  IR consulted for thoracocentesis but pleural effusion felt to be small to tap safely. CT chest finding as above.   - Now on p.o. Augmentin  per ID - Encourage incentive telemetry - Needs repeat CT in 4 to 6 weeks   Dementia without behavioral disturbance: Oriented to self and family. Failure to thrive/poor appetite/unintentional weight loss: family reports significant weight loss since her hospitalization in July 2025.  Not able to quantify.  Appetite remains poor even after addition of low-dose Remeron .  Dietitian recommends tube feed -Continue low-dose Remeron  -Appreciate input by dietitian-recommends cortrack and tube feed. -Appreciate input by palliative-full code with full scope of care per family   Uncontrolled hypertension: Improved  -Continue home clonidine  patch, losartan , Coreg , hydralazine  and Aldactone  -Cardizem  increased from 180 mg daily to 360 mg daily on 9/5 -Ultrafiltration per nephrology. -Continue monitoring   Acute right IJ DVT: Incidental finding.  Venous Doppler was ordered for left forearm swelling but showed acute right IJ DVT. - Started on Eliquis    ESRD on HD MWF-last HD on Wednesday.  No emergent need. - Per nephrology.   Anemia of renal disease: H&H relatively stable.  No overt bleeding such as melena or hematochezia.      Physical deconditioning - Therapy recommended SNF.  Seems family is in agreement now   History of uterine cancer: - Outpatient follow-up.   Hypokalemia -Monitor replenish K and Mg as  appropriate   Severe malnutrition/failure to  thrive/hypoalbuminemia Body mass index is 22.77 kg/m. Nutrition Problem: Severe Malnutrition Etiology: chronic illness Signs/Symptoms: severe muscle depletion, severe fat depletion, meal completion < 25%, per patient/family report, energy intake < or equal to 75% for > or equal to 1 month Interventions: Refer to RD note for recommendations    Medications     amoxicillin -clavulanate  1 tablet Oral Q12H   apixaban   10 mg Oral BID   Followed by   [name] ON 05/26/2024] apixaban   5 mg Oral BID   atorvastatin   40 mg Oral Daily   carvedilol   25 mg Oral BID WC   Chlorhexidine  Gluconate Cloth  6 each Topical Q0600   cloNIDine   0.3 mg Transdermal Weekly   diltiazem   360 mg Oral Daily   feeding supplement  1 Container Oral BID BM   feeding supplement (PROSource TF20)  60 mL Per Tube Daily   ferrous sulfate   325 mg Oral Daily   fiber supplement (BANATROL TF)  60 mL Per Tube TID   fidaxomicin   200 mg Oral BID   lactose free nutrition  237 mL Oral Daily   losartan   100 mg Oral Daily   mirtazapine   7.5 mg Oral QHS   multivitamin  1 tablet Oral QHS   pantoprazole   40 mg Oral Daily   sodium chloride  flush  10-40 mL Intracatheter Q12H   spironolactone   25 mg Oral Daily   thiamine   100 mg Oral Daily   [START ON 05/27/2024] vancomycin   125 mg Oral Daily     Data Reviewed:   CBG:  Recent Labs  Lab 05/21/24 1707 05/21/24 2243 05/22/24 0833 05/22/24 1349 05/22/24 2023  GLUCAP 150* 135* 119* 96 150*    SpO2: (!) 86 %    Vitals:   05/22/24 1315 05/22/24 1403 05/22/24 2029 05/23/24 0330  BP:  (!) 118/46 (!) 110/47 (!) 114/44  Pulse:  64 65 65  Resp:  17 18 16   Temp:  97.9 F (36.6 C) 98.2 F (36.8 C) 98.5 F (36.9 C)  TempSrc:  Oral Oral Oral  SpO2: 92% 94% 94% (!) 86%  Weight:      Height:          Data Reviewed:  Basic Metabolic Panel: Recent Labs  Lab 05/18/24 0305 05/19/24 0440 05/20/24 0555 05/21/24 1125 05/22/24 0314 05/23/24 0346  NA 136 136 134* 137 134*  137  K 3.5 3.3* 3.4* 3.5 3.2* 3.4*  CL 98 98 97* 97* 98 97*  CO2 25 25 25 27 27 27   GLUCOSE 116* 147* 110* 139* 122* 225*  BUN 12 22 28* 19 24* 16  CREATININE 2.99* 3.87* 4.54* 3.27* 3.77* 2.64*  CALCIUM  8.0* 8.1* 7.8* 7.7* 7.6* 7.5*  MG 1.8 2.0  --  1.7 1.7  --   PHOS 2.2* 3.1 3.4 2.7 2.9  --     CBC: Recent Labs  Lab 05/16/24 1308 05/17/24 0047 05/18/24 0305 05/19/24 0440 05/20/24 0555 05/21/24 1125 05/22/24 0314  WBC 21.7* 21.7* 20.5* 21.4* 19.7* 21.7* 22.7*  NEUTROABS 20.2* 21.3* 19.5* 20.8* 18.5*  --   --   HGB 8.3* 9.8* 8.1* 8.6* 8.4* 7.6* 7.4*  HCT 26.6* 31.7* 26.1* 27.7* 27.4* 25.0* 23.8*  MCV 97.4 97.8 96.3 96.2 95.8 97.7 97.1  PLT 252 266 258 263 258 227 201    LFT Recent Labs  Lab 05/16/24 1308 05/17/24 0047 05/18/24 0305 05/19/24 0440 05/20/24 0555 05/20/24 1010 05/21/24 1125 05/22/24 0314  AST 12*  13*  --   --   --   --   --   --   ALT 7 8  --   --   --   --   --   --   ALKPHOS 55 66  --   --   --   --   --   --   BILITOT 1.3* 1.1  --   --   --   --   --   --   PROT 5.8* 6.0*  --   --   --  5.7*  --   --   ALBUMIN 2.0* 1.9* 1.7* 1.6* 1.6*  --  1.6* <1.5*     Antibiotics: Anti-infectives (From admission, onward)    Start     Dose/Rate Route Frequency Ordered Stop   05/27/24 1000  vancomycin  (VANCOCIN ) capsule 125 mg        125 mg Oral Daily 05/21/24 1019 06/04/24 0959   05/20/24 1530  amoxicillin -clavulanate (AUGMENTIN ) 500-125 MG per tablet 1 tablet        1 tablet Oral Every 12 hours 05/20/24 1519 05/30/24 0959   05/17/24 0100  fidaxomicin  (DIFICID ) tablet 200 mg        200 mg Oral 2 times daily 05/17/24 0003 05/26/24 2159   05/17/24 0100  metroNIDAZOLE  (FLAGYL ) IVPB 500 mg  Status:  Discontinued        500 mg 100 mL/hr over 60 Minutes Intravenous Every 12 hours 05/17/24 0003 05/20/24 1519        DVT prophylaxis: Apixaban   Code Status: Full code  Family Communication: Discussed with patient's husband at bedside   CONSULTS  nephrology   Subjective   Denies any complaints.  Says that she did not sleep well last night.  Core track feeding tube in place   Objective    Physical Examination:   Appears in no acute distress S1-S2, regular Lungs clear to auscultation bilaterally Abdomen is soft, nontender  Status is: Inpatient:             Sabas GORMAN Brod   [facility] If 7PM-7AM, please contact night-coverage at [email or web address], Office  [phone]   05/23/2024, 8:28 AM  LOS: 7 days

## 2024-05-23 NOTE — Progress Notes (Signed)
 Contacted CSW to be advised of pt's HD clinic and schedule due to snf placement. Pt goes to [facility] on MWF 2nd shift. Will assist as needed.    Randine Mungo Dialysis Navigator [phone]

## 2024-05-24 DIAGNOSIS — N824 Other female intestinal-genital tract fistulae: Secondary | ICD-10-CM | POA: Diagnosis not present

## 2024-05-24 DIAGNOSIS — I10 Essential (primary) hypertension: Secondary | ICD-10-CM | POA: Diagnosis not present

## 2024-05-24 DIAGNOSIS — F03918 Unspecified dementia, unspecified severity, with other behavioral disturbance: Secondary | ICD-10-CM | POA: Diagnosis not present

## 2024-05-24 DIAGNOSIS — A0472 Enterocolitis due to Clostridium difficile, not specified as recurrent: Secondary | ICD-10-CM | POA: Diagnosis not present

## 2024-05-24 LAB — CBC
HCT: 22.3 % — ABNORMAL LOW (ref 36.0–46.0)
Hemoglobin: 6.6 g/dL — CL (ref 12.0–15.0)
MCH: 29.6 pg (ref 26.0–34.0)
MCHC: 29.6 g/dL — ABNORMAL LOW (ref 30.0–36.0)
MCV: 100 fL (ref 80.0–100.0)
Platelets: 183 K/uL (ref 150–400)
RBC: 2.23 MIL/uL — ABNORMAL LOW (ref 3.87–5.11)
RDW: 17 % — ABNORMAL HIGH (ref 11.5–15.5)
WBC: 23.4 K/uL — ABNORMAL HIGH (ref 4.0–10.5)
nRBC: 0 % (ref 0.0–0.2)

## 2024-05-24 LAB — PHOSPHORUS: Phosphorus: 1.3 mg/dL — ABNORMAL LOW (ref 2.5–4.6)

## 2024-05-24 LAB — CULTURE, BLOOD (ROUTINE X 2): Culture: NO GROWTH

## 2024-05-24 LAB — GLUCOSE, CAPILLARY
Glucose-Capillary: 222 mg/dL — ABNORMAL HIGH (ref 70–99)
Glucose-Capillary: 237 mg/dL — ABNORMAL HIGH (ref 70–99)

## 2024-05-24 LAB — RENAL FUNCTION PANEL
Albumin: <1.5 g/dL — ABNORMAL LOW (ref 3.5–5.0)
Anion gap: 9 (ref 5–15)
BUN: 33 mg/dL — ABNORMAL HIGH (ref 8–23)
CO2: 27 mmol/L (ref 22–32)
Calcium: 7.6 mg/dL — ABNORMAL LOW (ref 8.9–10.3)
Chloride: 98 mmol/L (ref 98–111)
Creatinine, Ser: 3.44 mg/dL — ABNORMAL HIGH (ref 0.44–1.00)
GFR, Estimated: 14 mL/min — ABNORMAL LOW (ref >60)
Glucose, Bld: 270 mg/dL — ABNORMAL HIGH (ref 70–99)
Phosphorus: 1.3 mg/dL — ABNORMAL LOW (ref 2.5–4.6)
Potassium: 3.8 mmol/L (ref 3.5–5.1)
Sodium: 134 mmol/L — ABNORMAL LOW (ref 135–145)

## 2024-05-24 LAB — MAGNESIUM: Magnesium: 1.7 mg/dL (ref 1.7–2.4)

## 2024-05-24 LAB — PREPARE RBC (CROSSMATCH): Order Confirmation: POSITIVE

## 2024-05-24 LAB — POTASSIUM: Potassium: 3.7 mmol/L (ref 3.5–5.1)

## 2024-05-24 MED ORDER — PENTAFLUOROPROP-TETRAFLUOROETH EX AERO: 1.0000 | INHALATION_SPRAY | CUTANEOUS | Status: DC | PRN

## 2024-05-24 MED ORDER — ANTICOAGULANT SODIUM CITRATE 4% (200MG/5ML) IV SOLN: 5.0000 mL | Status: DC | PRN

## 2024-05-24 MED ORDER — LOSARTAN POTASSIUM 50 MG PO TABS
50.0000 mg | ORAL_TABLET | Freq: Every day | ORAL | Status: DC
Start: 2024-05-24 — End: 2024-06-14
  Administered 2024-05-24 – 2024-06-13 (×18): 50 mg via ORAL
  Filled 2024-05-24 (×19): qty 1

## 2024-05-24 MED ORDER — LIDOCAINE-PRILOCAINE 2.5-2.5 % EX CREA: 1.0000 | TOPICAL_CREAM | CUTANEOUS | Status: DC | PRN

## 2024-05-24 MED ORDER — DARBEPOETIN ALFA 60 MCG/0.3ML IJ SOSY
60.0000 ug | PREFILLED_SYRINGE | INTRAMUSCULAR | Status: DC
  Administered 2024-05-24: 60 ug via SUBCUTANEOUS
  Filled 2024-05-24: qty 0.3

## 2024-05-24 MED ORDER — SODIUM CHLORIDE 0.9% IV SOLUTION: Freq: Once | INTRAVENOUS | Status: AC

## 2024-05-24 MED ORDER — LIDOCAINE HCL (PF) 1 % IJ SOLN: 5.0000 mL | INTRAMUSCULAR | Status: DC | PRN

## 2024-05-24 MED ORDER — ALTEPLASE 2 MG IJ SOLR: 2.0000 mg | Freq: Once | INTRAMUSCULAR | Status: DC | PRN

## 2024-05-24 NOTE — Progress Notes (Signed)
 PT Cancellation Note  Patient Details Name: Tracy Jennings MRN: [id number] DOB: 07/06/1956   Cancelled Treatment:    Reason Eval/Treat Not Completed: (P) Patient at procedure or test/unavailable, pt off unit at HD. Will check back as schedule allows to continue with PT POC.  Tracy Jennings. PTA Acute Rehabilitation Services Office: [phone]    Tracy Jennings 05/24/2024, 9:21 AM

## 2024-05-24 NOTE — Progress Notes (Addendum)
 Type and a screen sent down to lab at 10:53AM from [facility].  Tracy Brasil, LPN-Kidney Dialysis Unit

## 2024-05-24 NOTE — Progress Notes (Signed)
 Received patient in bed to unit.  Alert and oriented.  Informed consent signed and in chart.   TX duration: 3 hours  Patient tolerated well.  Transported back to the room  Alert, without acute distress.  Hand-off given to patient's nurse.   Access used: Left AV Fistula Upper Arm Access issues: none  Total UF removed: 1L Medication(s) given: none   05/24/24 1206  Vitals  Temp 97.9 F (36.6 C)  BP (!) 98/47  MAP (mmHg) (!) 63  Pulse Rate 68  ECG Heart Rate 66  Resp 13  Oxygen Therapy  SpO2 96 %  O2 Device Room Air  During Treatment Monitoring  Duration of HD Treatment -hour(s) 3 hour(s)  HD Safety Checks Performed Yes  Intra-Hemodialysis Comments Tx completed  Post Treatment  Dialyzer Clearance Lightly streaked  Liters Processed 68.7  Fluid Removed (mL) 1000 mL  Tolerated HD Treatment Yes  Fistula / Graft Left Upper arm Arteriovenous fistula  No placement date or time found.   Orientation: Left  Access Location: Upper arm  Access Type: Arteriovenous fistula  Site Condition No complications  Fistula / Graft Assessment Present;Thrill;Bruit  Status Deaccessed  Drainage Description None     Camellia Brasil LPN Kidney Dialysis Unit

## 2024-05-24 NOTE — Progress Notes (Signed)
 [facility]  PROGRESS NOTE  Tracy Jennings FMW:[id number] DOB: 1956/05/10 DOA: 05/16/2024 PCP: Zachary Idelia LABOR, MD   Brief HPI:   68 year old F with PMH of cognitive impairment, ESRD on HD MWF, cervical cancer, HTN, HLD, anemia and recent hospitalizations from 6/24-7/22 for hypertensive urgency and delirium, and 8/17-8/24 for C. difficile colitis for which she was treated with p.o. vancomycin  brought back to [facility] from PCP office due to weakness, nausea, diarrhea and abdominal discomfort.   ID consulted for persistent leukocytosis and started Augmentin  and ordered CT chest that showed small slightly loculated appearing left pleural effusion, subpleural rounded consolidation at lingula, ovoid masslike consolidation at the anterior left lung base contagious with curvilinear consolidation posteriorly suggesting atelectasis, patchy areas of mild bilateral ground glass density and anasarca.     Assessment/Plan:   C. difficile colitis: Completed 10 days of oral vancomycin  about a week ago.  Ongoing abdominal pain and diarrhea.  CT suggested rectosigmoid colitis.  She has loose stool but not frequently.  Abdominal exam benign.  Still with significant leukocytosis - Continue Dificid  - Continue soft diet. As per ID recommendation  -Continue Augmentin  to complete 10 days on antibiotics for possible pneumonia with possible or loculation EOT 9/17.  Patient reports minimal respiratory symptoms - Complete Dificid  x 10 days then vancomycin  p.o. daily 7 days out form completing Augmentin .  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  Abdominal exam benign. - General Surgery recommends elective management and treating C. difficile. - Continue soft diet.   Persistent leukocytosis/bandemia: Persisted despite treatment of C. difficile.  Has no respiratory symptoms but chronic left pleural effusion which was felt to be small to tap.  Left forearm swelling but no signs of infection.  Blood  culture negative on 9/4. Incidental right IJ acute DVT.  CT showed small slightly loculated appearing left pleural effusion and masslike consolidations as above. -Appreciate help by ID-started p.o. Augmentin  -Encourage incentive telemetry -Needs repeat CT in 4 to 6 weeks for LLL mass.   Loculated left pleural effusion/masslike consolidation: Seems chronic.  Has no respiratory symptoms.  IR consulted for thoracocentesis but pleural effusion felt to be small to tap safely. CT chest finding as above.   - Now on p.o. Augmentin  per ID - Encourage incentive telemetry - Needs repeat CT in 4 to 6 weeks   Dementia without behavioral disturbance: Oriented to self and family. Failure to thrive/poor appetite/unintentional weight loss: family reports significant weight loss since her hospitalization in July 2025.  Not able to quantify.  Appetite remains poor even after addition of low-dose Remeron .  Dietitian recommends tube feed -Continue low-dose Remeron  -Appreciate input by dietitian-recommends cortrack and tube feed. -Appreciate input by palliative-full code with full scope of care per family   Uncontrolled hypertension: Improved  -Continue home clonidine  patch, losartan , Coreg , hydralazine  and Aldactone  -Cardizem  increased from 180 mg daily to 360 mg daily on 9/5 -Ultrafiltration per nephrology. -Continue monitoring   Acute right IJ DVT: Incidental finding.  Venous Doppler was ordered for left forearm swelling but showed acute right IJ DVT. - Started on Eliquis    ESRD on HD MWF-last HD on Wednesday.  No emergent need. - Per nephrology.   Anemia of renal disease: H&H relatively stable.  No overt bleeding such as melena or hematochezia. - Hemoglobin dropped to 6.6, will order 1 unit PRBC - Follow CBC in a.m.      Physical deconditioning - Therapy recommended SNF.  Seems family is in agreement now   History  of uterine cancer: - Outpatient follow-up.   Hypokalemia -Monitor replenish K  and Mg as appropriate   Severe malnutrition/failure to thrive/hypoalbuminemia Body mass index is 22.77 kg/m. Nutrition Problem: Severe Malnutrition Etiology: chronic illness Signs/Symptoms: severe muscle depletion, severe fat depletion, meal completion < 25%, per patient/family report, energy intake < or equal to 75% for > or equal to 1 month Interventions: Refer to RD note for recommendations    Medications     amoxicillin -clavulanate  1 tablet Oral Q12H   apixaban   10 mg Oral BID   Followed by   [name] ON 05/26/2024] apixaban   5 mg Oral BID   atorvastatin   40 mg Oral Daily   carvedilol   25 mg Oral BID WC   Chlorhexidine  Gluconate Cloth  6 each Topical Q0600   cloNIDine   0.3 mg Transdermal Weekly   darbepoetin (ARANESP ) injection - DIALYSIS  60 mcg Subcutaneous Q Fri-1800   diltiazem   360 mg Oral Daily   feeding supplement  1 Container Oral BID BM   feeding supplement (PROSource TF20)  60 mL Per Tube Daily   ferrous sulfate   325 mg Oral Daily   fiber supplement (BANATROL TF)  60 mL Per Tube TID   fidaxomicin   200 mg Oral BID   lactose free nutrition  237 mL Oral Daily   losartan   50 mg Oral Daily   mirtazapine   7.5 mg Oral QHS   multivitamin  1 tablet Oral QHS   pantoprazole   40 mg Oral Daily   sodium chloride  flush  10-40 mL Intracatheter Q12H   thiamine   100 mg Oral Daily   [START ON 05/27/2024] vancomycin   125 mg Oral Daily     Data Reviewed:   CBG:  Recent Labs  Lab 05/22/24 1349 05/22/24 2023 05/23/24 1210 05/23/24 2054 05/24/24 1642  GLUCAP 96 150* 221* 219* 222*    SpO2: 97 %    Vitals:   05/24/24 1230 05/24/24 1330 05/24/24 1606 05/24/24 1648  BP:  (!) 126/54 (!) 124/47 118/60  Pulse:  62 68 71  Resp:   16 18  Temp:   98.1 F (36.7 C) 98 F (36.7 C)  TempSrc:   Oral Oral  SpO2:   97%   Weight: 55.4 kg     Height:          Data Reviewed:  Basic Metabolic Panel: Recent Labs  Lab 05/18/24 0305 05/19/24 0440 05/20/24 0555  05/21/24 1125 05/22/24 0314 05/23/24 0346 05/24/24 0403 05/24/24 0946  NA 136 136 134* 137 134* 137  --  134*  K 3.5 3.3* 3.4* 3.5 3.2* 3.4* 3.7 3.8  CL 98 98 97* 97* 98 97*  --  98  CO2 25 25 25 27 27 27   --  27  GLUCOSE 116* 147* 110* 139* 122* 225*  --  270*  BUN 12 22 28* 19 24* 16  --  33*  CREATININE 2.99* 3.87* 4.54* 3.27* 3.77* 2.64*  --  3.44*  CALCIUM  8.0* 8.1* 7.8* 7.7* 7.6* 7.5*  --  7.6*  MG 1.8 2.0  --  1.7 1.7  --  1.7  --   PHOS 2.2* 3.1 3.4 2.7 2.9  --  1.3* 1.3*    CBC: Recent Labs  Lab 05/18/24 0305 05/19/24 0440 05/20/24 0555 05/21/24 1125 05/22/24 0314 05/24/24 0946  WBC 20.5* 21.4* 19.7* 21.7* 22.7* 23.4*  NEUTROABS 19.5* 20.8* 18.5*  --   --   --   HGB 8.1* 8.6* 8.4* 7.6* 7.4* 6.6*  HCT  26.1* 27.7* 27.4* 25.0* 23.8* 22.3*  MCV 96.3 96.2 95.8 97.7 97.1 100.0  PLT 258 263 258 227 201 183    LFT Recent Labs  Lab 05/19/24 0440 05/20/24 0555 05/20/24 1010 05/21/24 1125 05/22/24 0314 05/24/24 0946  PROT  --   --  5.7*  --   --   --   ALBUMIN 1.6* 1.6*  --  1.6* <1.5* <1.5*     Antibiotics: Anti-infectives (From admission, onward)    Start     Dose/Rate Route Frequency Ordered Stop   05/27/24 1000  vancomycin  (VANCOCIN ) capsule 125 mg        125 mg Oral Daily 05/21/24 1019 06/04/24 0959   05/20/24 1530  amoxicillin -clavulanate (AUGMENTIN ) 500-125 MG per tablet 1 tablet        1 tablet Oral Every 12 hours 05/20/24 1519 05/30/24 0959   05/17/24 0100  fidaxomicin  (DIFICID ) tablet 200 mg        200 mg Oral 2 times daily 05/17/24 0003 05/26/24 2159   05/17/24 0100  metroNIDAZOLE  (FLAGYL ) IVPB 500 mg  Status:  Discontinued        500 mg 100 mL/hr over 60 Minutes Intravenous Every 12 hours 05/17/24 0003 05/20/24 1519        DVT prophylaxis: Apixaban   Code Status: Full code  Family Communication: Discussed with patient's husband at bedside   CONSULTS nephrology   Subjective   Hemoglobin dropped to 6.6 today   Objective     Physical Examination:   Appears in no acute distress S1-S2, regular Lungs clear to auscultation bilaterally  Status is: Inpatient:             Sabas GORMAN Brod   [facility] If 7PM-7AM, please contact night-coverage at [email or web address], Office  [phone]   05/24/2024, 5:06 PM  LOS: 8 days

## 2024-05-24 NOTE — Progress Notes (Signed)
 [facility] Progress Note   Subjective:   Seen and examined patient at bedside.  Cortrak has been inserted and TF initiated.   Doesn't offer much history but denies dyspnea, says diarrhea improved.  No family bedside this AM  Objective Vitals:   05/23/24 0330 05/23/24 0935 05/23/24 1720 05/23/24 2100  BP: (!) 114/44 (!) 112/52 (!) 113/47 (!) 117/54  Pulse: 65 66 68 70  Resp: 16 16 16 17   Temp: 98.5 F (36.9 C) 98.2 F (36.8 C) 98.3 F (36.8 C) 97.9 F (36.6 C)  TempSrc: Oral Oral Oral Oral  SpO2: (!) 86% 92% 98% 94%  Weight:      Height:       Physical Exam General: Awake with eyes closed, chronically ill-appearing; thin, on RA, NAD Heart: S1 and S2; No murmurs, gallops, rubs Lungs: Clear anteriorly Extremities: No LE edema Dialysis Access: L AVG +t/b  Filed Weights   05/17/24 1938 05/20/24 1306 05/22/24 0926  Weight: 58.6 kg 58.3 kg 56.9 kg    Intake/Output Summary (Last 24 hours) at 05/24/2024 0758 Last data filed at 05/24/2024 0230 Gross per 24 hour  Intake 5 ml  Output --  Net 5 ml     Additional Objective Labs: Basic Metabolic Panel: Recent Labs  Lab 05/21/24 1125 05/22/24 0314 05/23/24 0346 05/24/24 0403  NA 137 134* 137  --   K 3.5 3.2* 3.4* 3.7  CL 97* 98 97*  --   CO2 27 27 27   --   GLUCOSE 139* 122* 225*  --   BUN 19 24* 16  --   CREATININE 3.27* 3.77* 2.64*  --   CALCIUM  7.7* 7.6* 7.5*  --   PHOS 2.7 2.9  --  1.3*   Liver Function Tests: Recent Labs  Lab 05/20/24 0555 05/20/24 1010 05/21/24 1125 05/22/24 0314  PROT  --  5.7*  --   --   ALBUMIN 1.6*  --  1.6* <1.5*   No results for input(s): LIPASE, AMYLASE in the last 168 hours.  CBC: Recent Labs  Lab 05/18/24 0305 05/19/24 0440 05/20/24 0555 05/21/24 1125 05/22/24 0314  WBC 20.5* 21.4* 19.7* 21.7* 22.7*  NEUTROABS 19.5* 20.8* 18.5*  --   --   HGB 8.1* 8.6* 8.4* 7.6* 7.4*  HCT 26.1* 27.7* 27.4* 25.0* 23.8*  MCV 96.3 96.2 95.8 97.7 97.1  PLT 258 263 258  227 201   Blood Culture    Component Value Date/Time   SDES BLOOD BLOOD RIGHT HAND 05/16/2024 1643   SPECREQUEST  05/16/2024 1643    BOTTLES DRAWN AEROBIC AND ANAEROBIC BOTTLES DRAWN AEROBIC ONLY   CULT  05/16/2024 1643    NO GROWTH 4 DAYS Performed at [facility], [address] [place], [place] [address]    REPTSTATUS PENDING 05/16/2024 1643    Cardiac Enzymes: No results for input(s): CKTOTAL, CKMB, CKMBINDEX, TROPONINI in the last 168 hours. CBG: Recent Labs  Lab 05/22/24 0833 05/22/24 1349 05/22/24 2023 05/23/24 1210 05/23/24 2054  GLUCAP 119* 96 150* 221* 219*   Iron Studies: No results for input(s): IRON, TIBC, TRANSFERRIN, FERRITIN in the last 72 hours. Lab Results  Component Value Date   INR 1.3 (H) 03/05/2024   INR 1.1 11/03/2019   INR 1.04 05/12/2018   Studies/Results: DG Abd Portable 1V Result Date: 05/22/2024 CLINICAL DATA:  [id number] Encounter for feeding tube placement [id number] EXAM: PORTABLE ABDOMEN - 1 VIEW COMPARISON:  05/16/2024. FINDINGS: The bowel gas pattern is non-obstructive. No evidence of pneumoperitoneum, within  the limitations of a supine film. No acute osseous abnormalities. There is small left pleural effusion blunting the left lateral costophrenic angle. No significant right pleural effusion. Surgical changes, devices, tubes and lines: Weighted Dobbhoff tube noted coursing below the left hemidiaphragm with its tip overlying the left lower abdomen, over the region of distal stomach. Right-sided CT Port-A-Cath is seen with its tip overlying the lower portion of superior vena cava. IMPRESSION: *Weighted Dobbhoff tube noted coursing below the left hemidiaphragm with its tip overlying the left lower abdomen, over the region of distal stomach. Electronically Signed   By: Ree Molt M.D.   On: 05/22/2024 15:35    Medications:  anticoagulant sodium citrate      feeding supplement (OSMOLITE 1.5 CAL) 1,000 mL (05/24/24 0327)      amoxicillin -clavulanate  1 tablet Oral Q12H   apixaban   10 mg Oral BID   Followed by   [name] ON 05/26/2024] apixaban   5 mg Oral BID   atorvastatin   40 mg Oral Daily   carvedilol   25 mg Oral BID WC   Chlorhexidine  Gluconate Cloth  6 each Topical Q0600   cloNIDine   0.3 mg Transdermal Weekly   diltiazem   360 mg Oral Daily   feeding supplement  1 Container Oral BID BM   feeding supplement (PROSource TF20)  60 mL Per Tube Daily   ferrous sulfate   325 mg Oral Daily   fiber supplement (BANATROL TF)  60 mL Per Tube TID   fidaxomicin   200 mg Oral BID   lactose free nutrition  237 mL Oral Daily   losartan   100 mg Oral Daily   mirtazapine   7.5 mg Oral QHS   multivitamin  1 tablet Oral QHS   pantoprazole   40 mg Oral Daily   sodium chloride  flush  10-40 mL Intracatheter Q12H   thiamine   100 mg Oral Daily   [START ON 05/27/2024] vancomycin   125 mg Oral Daily    Dialysis Orders: MWF [facility]  3.5hrs BFR 400 DFR 500 EDW 53kg 2K/2.5Ca Heparin  bolus 600 units with HD then 400 units/hr Calcitriol  1.38mcg with HD - last dose 9/3 Sensipar 60mg  with HD - last dose 9/3 Micera 75mcg Q 4 weeks - per records, dose just ordered Calcium  Acetate 667mh TID with meals   Assessment/Plan: Colo-uterine fistula: per abd CT. Per surgery/ pmd - appears surgery has signed off and plans outpt elective management; ID recommended augmentin  as well Cdif colitis: per ID on fidaxomicin  LUE edema: LUE duplex ordered by Hospitalist - no issue with LUE but acute non occlusive RIJ clot noted - started on eliquis .  No issues with AVG thus far. Monitor. ESRD: on HD MWF. Next 9/12 HTN: HTN on 5 BP agents. Noted allergy to Amlodipine .  BP on lower side now, d/c'd spironolactone  and remains low, decrease losartan  50%.  Volume: euvolemic on exam, no edema, CXR no edema. Anemia of esrd: has trended down into the 7s, no overt bleeding. Start ESA 9/12. 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  Holding binders, VDRA, and Sensipar for now.  Dementia Malnutrition: had cortrak inserted and TF started 9/10 GOC: Palliative consulted - full scope at this time it appears - to see how she's doing after a few days of TF, family doesn't seem interested in PEG permanently and I agree.      Awaiting SNF - on hold give new NG tube and TF  Manuelita Barters MD [facility] Pager [phone]

## 2024-05-24 NOTE — Progress Notes (Signed)
 Informed by lab that patient's hemoglobin collected 05/24/24 at 0946 resulted 6.6. Sabas Brod MD notified via secure chat.

## 2024-05-25 DIAGNOSIS — I10 Essential (primary) hypertension: Secondary | ICD-10-CM | POA: Diagnosis not present

## 2024-05-25 DIAGNOSIS — N824 Other female intestinal-genital tract fistulae: Secondary | ICD-10-CM | POA: Diagnosis not present

## 2024-05-25 DIAGNOSIS — A0472 Enterocolitis due to Clostridium difficile, not specified as recurrent: Secondary | ICD-10-CM | POA: Diagnosis not present

## 2024-05-25 DIAGNOSIS — F03918 Unspecified dementia, unspecified severity, with other behavioral disturbance: Secondary | ICD-10-CM | POA: Diagnosis not present

## 2024-05-25 LAB — TYPE AND SCREEN
ABO/RH(D): A POS
Antibody Screen: NEGATIVE
Unit division: 0

## 2024-05-25 LAB — COMPREHENSIVE METABOLIC PANEL WITH GFR
ALT: 8 U/L (ref 0–44)
AST: 15 U/L (ref 15–41)
Albumin: 1.6 g/dL — ABNORMAL LOW (ref 3.5–5.0)
Alkaline Phosphatase: 68 U/L (ref 38–126)
Anion gap: 10 (ref 5–15)
BUN: 26 mg/dL — ABNORMAL HIGH (ref 8–23)
CO2: 28 mmol/L (ref 22–32)
Calcium: 7.4 mg/dL — ABNORMAL LOW (ref 8.9–10.3)
Chloride: 96 mmol/L — ABNORMAL LOW (ref 98–111)
Creatinine, Ser: 2.43 mg/dL — ABNORMAL HIGH (ref 0.44–1.00)
GFR, Estimated: 21 mL/min — ABNORMAL LOW (ref >60)
Glucose, Bld: 252 mg/dL — ABNORMAL HIGH (ref 70–99)
Potassium: 3.9 mmol/L (ref 3.5–5.1)
Sodium: 134 mmol/L — ABNORMAL LOW (ref 135–145)
Total Bilirubin: 0.6 mg/dL (ref 0.0–1.2)
Total Protein: 5.4 g/dL — ABNORMAL LOW (ref 6.5–8.1)

## 2024-05-25 LAB — GLUCOSE, CAPILLARY
Glucose-Capillary: 267 mg/dL — ABNORMAL HIGH (ref 70–99)
Glucose-Capillary: 288 mg/dL — ABNORMAL HIGH (ref 70–99)
Glucose-Capillary: 348 mg/dL — ABNORMAL HIGH (ref 70–99)
Glucose-Capillary: 288 mg/dL — ABNORMAL HIGH (ref 70–99)

## 2024-05-25 LAB — MAGNESIUM: Magnesium: 1.6 mg/dL — ABNORMAL LOW (ref 1.7–2.4)

## 2024-05-25 LAB — CBC
HCT: 29.1 % — ABNORMAL LOW (ref 36.0–46.0)
Hemoglobin: 9.2 g/dL — ABNORMAL LOW (ref 12.0–15.0)
MCH: 28.3 pg (ref 26.0–34.0)
MCHC: 31.6 g/dL (ref 30.0–36.0)
MCV: 89.5 fL (ref 80.0–100.0)
Platelets: 174 K/uL (ref 150–400)
RBC: 3.25 MIL/uL — ABNORMAL LOW (ref 3.87–5.11)
RDW: 23.9 % — ABNORMAL HIGH (ref 11.5–15.5)
WBC: 24.2 K/uL — ABNORMAL HIGH (ref 4.0–10.5)
nRBC: 0 % (ref 0.0–0.2)

## 2024-05-25 LAB — BPAM RBC
Blood Product Expiration Date: 202510032359
ISSUE DATE / TIME: 202509121623
Unit Type and Rh: 6200

## 2024-05-25 LAB — PHOSPHORUS: Phosphorus: <1 mg/dL — CL (ref 2.5–4.6)

## 2024-05-25 MED ORDER — INSULIN ASPART 100 UNIT/ML IJ SOLN
0.0000 [IU] | Freq: Three times a day (TID) | INTRAMUSCULAR | Status: DC
  Administered 2024-05-26: 5 [IU] via SUBCUTANEOUS
  Administered 2024-05-26: 11 [IU] via SUBCUTANEOUS
  Administered 2024-05-27: 3 [IU] via SUBCUTANEOUS
  Administered 2024-05-28: 2 [IU] via SUBCUTANEOUS
  Administered 2024-05-30: 3 [IU] via SUBCUTANEOUS

## 2024-05-25 MED ORDER — INSULIN GLARGINE 100 UNIT/ML ~~LOC~~ SOLN
10.0000 [IU] | Freq: Every day | SUBCUTANEOUS | Status: DC
  Administered 2024-05-25: 10 [IU] via SUBCUTANEOUS
  Filled 2024-05-25 (×2): qty 0.1

## 2024-05-25 MED ORDER — POTASSIUM PHOSPHATES 15 MMOLE/5ML IV SOLN
15.0000 mmol | Freq: Two times a day (BID) | INTRAVENOUS | Status: AC
  Administered 2024-05-25 (×2): 15 mmol via INTRAVENOUS
  Filled 2024-05-25 (×2): qty 5

## 2024-05-25 MED ORDER — MAGNESIUM SULFATE 2 GM/50ML IV SOLN
2.0000 g | Freq: Once | INTRAVENOUS | Status: AC
  Administered 2024-05-25: 2 g via INTRAVENOUS
  Filled 2024-05-25: qty 50

## 2024-05-25 NOTE — Progress Notes (Signed)
 Notified Dr Drusilla via secured chat about the new wound/abrasions on Pt's Labia (Swollen, painful, few abrasions and bleeding). Photo available in Media.

## 2024-05-25 NOTE — Progress Notes (Signed)
 Blood sugar level 348 mg/dL @17 :12 (05/25/24) Notified Dr Drusilla.

## 2024-05-25 NOTE — Plan of Care (Signed)
  Problem: Clinical Measurements: Goal: Will remain free from infection Outcome: Progressing   Problem: Nutrition: Goal: Adequate nutrition will be maintained Outcome: Progressing   Problem: Pain Managment: Goal: General experience of comfort will improve and/or be controlled 05/25/2024 0024 by Fredda Honor Leash, RN Outcome: Progressing 05/25/2024 0010 by Fredda Honor Leash, RN Outcome: Progressing   Problem: Safety: Goal: Ability to remain free from injury will improve 05/25/2024 0024 by Fredda Honor Leash, RN Outcome: Progressing 05/25/2024 0010 by Fredda Honor Leash, RN Outcome: Progressing

## 2024-05-25 NOTE — Plan of Care (Signed)
   Problem: Clinical Measurements: Goal: Will remain free from infection Outcome: Progressing   Problem: Pain Managment: Goal: General experience of comfort will improve and/or be controlled Outcome: Progressing   Problem: Safety: Goal: Ability to remain free from injury will improve Outcome: Progressing

## 2024-05-25 NOTE — Plan of Care (Signed)
  Problem: Education: Goal: Knowledge of General Education information will improve Description: Including pain rating scale, medication(s)/side effects and non-pharmacologic comfort measures Outcome: Progressing   Problem: Clinical Measurements: Goal: Ability to maintain clinical measurements within normal limits will improve Outcome: Progressing Goal: Will remain free from infection Outcome: Progressing Goal: Diagnostic test results will improve Outcome: Progressing   Problem: Pain Managment: Goal: General experience of comfort will improve and/or be controlled Outcome: Progressing

## 2024-05-25 NOTE — Progress Notes (Signed)
 Lab called for a critical result of 1.0.Provider made aware with new order.

## 2024-05-25 NOTE — Progress Notes (Signed)
 [facility]  PROGRESS NOTE  Tracy Jennings FMW:[id number] DOB: 07/19/1956 DOA: 05/16/2024 PCP: Zachary Idelia LABOR, MD   Brief HPI:   68 year old F with PMH of cognitive impairment, ESRD on HD MWF, cervical cancer, HTN, HLD, anemia and recent hospitalizations from 6/24-7/22 for hypertensive urgency and delirium, and 8/17-8/24 for C. difficile colitis for which she was treated with p.o. vancomycin  brought back to [facility] from PCP office due to weakness, nausea, diarrhea and abdominal discomfort.   ID consulted for persistent leukocytosis and started Augmentin  and ordered CT chest that showed small slightly loculated appearing left pleural effusion, subpleural rounded consolidation at lingula, ovoid masslike consolidation at the anterior left lung base contagious with curvilinear consolidation posteriorly suggesting atelectasis, patchy areas of mild bilateral ground glass density and anasarca.     Assessment/Plan:   C. difficile colitis: Completed 10 days of oral vancomycin  about a week ago.  Ongoing abdominal pain and diarrhea.  CT suggested rectosigmoid colitis.  She has loose stool but not frequently.  Abdominal exam benign.  Still with significant leukocytosis - Continue Dificid  - Continue soft diet. As per ID recommendation  -Continue Augmentin  to complete 10 days on antibiotics for possible pneumonia with possible or loculation EOT 9/17.  Patient reports minimal respiratory symptoms - Complete Dificid  x 10 days then vancomycin  p.o. daily 7 days out form completing Augmentin .  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  Abdominal exam benign. - General Surgery recommends elective management and treating C. difficile. - Continue soft diet.   Persistent leukocytosis/bandemia: Persisted despite treatment of C. difficile.  Has no respiratory symptoms but chronic left pleural effusion which was felt to be small to tap.  Left forearm swelling but no signs of infection.  Blood  culture negative on 9/4. Incidental right IJ acute DVT.  CT showed small slightly loculated appearing left pleural effusion and masslike consolidations as above. -Appreciate help by ID-started p.o. Augmentin  -Encourage incentive telemetry -Needs repeat CT in 4 to 6 weeks for LLL mass.   Loculated left pleural effusion/masslike consolidation: Seems chronic.  Has no respiratory symptoms.  IR consulted for thoracocentesis but pleural effusion felt to be small to tap safely. CT chest finding as above.   - Now on p.o. Augmentin  per ID - Encourage incentive telemetry - Needs repeat CT in 4 to 6 weeks   Dementia without behavioral disturbance: Oriented to self and family. Failure to thrive/poor appetite/unintentional weight loss: family reports significant weight loss since her hospitalization in July 2025.  Not able to quantify.  Appetite remains poor even after addition of low-dose Remeron .  Dietitian recommends tube feed -Continue low-dose Remeron  -Appreciate input by dietitian-recommends cortrack and tube feed. -Appreciate input by palliative-full code with full scope of care per family   Uncontrolled hypertension: Improved  -Continue home clonidine  patch, losartan , Coreg , hydralazine  and Aldactone  -Cardizem  increased from 180 mg daily to 360 mg daily on 9/5 -Ultrafiltration per nephrology. -Continue monitoring   Acute right IJ DVT: Incidental finding.  Venous Doppler was ordered for left forearm swelling but showed acute right IJ DVT. - Started on Eliquis    ESRD on HD MWF-last HD on Wednesday.  No emergent need. - Per nephrology.   Anemia of renal disease: H&H relatively stable.  No overt bleeding such as melena or hematochezia. - Hemoglobin dropped to 6.6, status post 1 unit PRBC  - Hemoglobin up to 9.2 today  - Follow CBC in a.m.      Physical deconditioning - Therapy recommended SNF.  Seems  family is in agreement now   History of uterine cancer: - Outpatient follow-up.    Hypokalemia -Monitor replenish K and Mg as appropriate   Severe malnutrition/failure to thrive/hypoalbuminemia Body mass index is 22.77 kg/m. Nutrition Problem: Severe Malnutrition Etiology: chronic illness Signs/Symptoms: severe muscle depletion, severe fat depletion, meal completion < 25%, per patient/family report, energy intake < or equal to 75% for > or equal to 1 month Interventions: Refer to RD note for recommendations    Medications     amoxicillin -clavulanate  1 tablet Oral Q12H   apixaban   10 mg Oral BID   Followed by   [name] ON 05/26/2024] apixaban   5 mg Oral BID   atorvastatin   40 mg Oral Daily   carvedilol   25 mg Oral BID WC   Chlorhexidine  Gluconate Cloth  6 each Topical Q0600   cloNIDine   0.3 mg Transdermal Weekly   darbepoetin (ARANESP ) injection - DIALYSIS  60 mcg Subcutaneous Q Fri-1800   diltiazem   360 mg Oral Daily   feeding supplement  1 Container Oral BID BM   feeding supplement (PROSource TF20)  60 mL Per Tube Daily   ferrous sulfate   325 mg Oral Daily   fiber supplement (BANATROL TF)  60 mL Per Tube TID   fidaxomicin   200 mg Oral BID   lactose free nutrition  237 mL Oral Daily   losartan   50 mg Oral Daily   mirtazapine   7.5 mg Oral QHS   multivitamin  1 tablet Oral QHS   pantoprazole   40 mg Oral Daily   sodium chloride  flush  10-40 mL Intracatheter Q12H   thiamine   100 mg Oral Daily   [START ON 05/27/2024] vancomycin   125 mg Oral Daily     Data Reviewed:   CBG:  Recent Labs  Lab 05/23/24 1210 05/23/24 2054 05/24/24 1642 05/24/24 2019 05/25/24 0806  GLUCAP 221* 219* 222* 237* 267*    SpO2: 100 %    Vitals:   05/24/24 1648 05/24/24 2022 05/25/24 0434 05/25/24 0808  BP: 118/60 (!) 138/53 114/74 (!) 154/60  Pulse: 71 75 72 67  Resp: 18 18 18 14   Temp: 98 F (36.7 C) 97.9 F (36.6 C) 99 F (37.2 C) 97.8 F (36.6 C)  TempSrc: Oral Oral Oral Oral  SpO2:  100% 100% 100%  Weight:      Height:          Data Reviewed:  Basic  Metabolic Panel: Recent Labs  Lab 05/19/24 0440 05/20/24 0555 05/21/24 1125 05/22/24 0314 05/23/24 0346 05/24/24 0403 05/24/24 0946 05/25/24 0410  NA 136   < > 137 134* 137  --  134* 134*  K 3.3*   < > 3.5 3.2* 3.4* 3.7 3.8 3.9  CL 98   < > 97* 98 97*  --  98 96*  CO2 25   < > 27 27 27   --  27 28  GLUCOSE 147*   < > 139* 122* 225*  --  270* 252*  BUN 22   < > 19 24* 16  --  33* 26*  CREATININE 3.87*   < > 3.27* 3.77* 2.64*  --  3.44* 2.43*  CALCIUM  8.1*   < > 7.7* 7.6* 7.5*  --  7.6* 7.4*  MG 2.0  --  1.7 1.7  --  1.7  --  1.6*  PHOS 3.1   < > 2.7 2.9  --  1.3* 1.3* <1.0*   < > = values in this interval not displayed.  CBC: Recent Labs  Lab 05/19/24 0440 05/20/24 0555 05/21/24 1125 05/22/24 0314 05/24/24 0946 05/25/24 0410  WBC 21.4* 19.7* 21.7* 22.7* 23.4* 24.2*  NEUTROABS 20.8* 18.5*  --   --   --   --   HGB 8.6* 8.4* 7.6* 7.4* 6.6* 9.2*  HCT 27.7* 27.4* 25.0* 23.8* 22.3* 29.1*  MCV 96.2 95.8 97.7 97.1 100.0 89.5  PLT 263 258 227 201 183 174    LFT Recent Labs  Lab 05/20/24 0555 05/20/24 1010 05/21/24 1125 05/22/24 0314 05/24/24 0946 05/25/24 0410  AST  --   --   --   --   --  15  ALT  --   --   --   --   --  8  ALKPHOS  --   --   --   --   --  68  BILITOT  --   --   --   --   --  0.6  PROT  --  5.7*  --   --   --  5.4*  ALBUMIN 1.6*  --  1.6* <1.5* <1.5* 1.6*     Antibiotics: Anti-infectives (From admission, onward)    Start     Dose/Rate Route Frequency Ordered Stop   05/27/24 1000  vancomycin  (VANCOCIN ) capsule 125 mg        125 mg Oral Daily 05/21/24 1019 06/04/24 0959   05/20/24 1530  amoxicillin -clavulanate (AUGMENTIN ) 500-125 MG per tablet 1 tablet        1 tablet Oral Every 12 hours 05/20/24 1519 05/30/24 0959   05/17/24 0100  fidaxomicin  (DIFICID ) tablet 200 mg        200 mg Oral 2 times daily 05/17/24 0003 05/26/24 2159   05/17/24 0100  metroNIDAZOLE  (FLAGYL ) IVPB 500 mg  Status:  Discontinued        500 mg 100 mL/hr over 60 Minutes  Intravenous Every 12 hours 05/17/24 0003 05/20/24 1519        DVT prophylaxis: Apixaban   Code Status: Full code  Family Communication: Discussed with patient's husband at bedside   CONSULTS nephrology   Subjective   Denies any complaints   Objective    Physical Examination:   Appears in no acute distress S1-S2, regular Lungs clear to auscultation bilaterally  Status is: Inpatient:             Tracy Jennings   [facility] If 7PM-7AM, please contact night-coverage at [email or web address], Office  [phone]   05/25/2024, 8:43 AM  LOS: 9 days

## 2024-05-25 NOTE — Progress Notes (Signed)
 [facility] Progress Note   Subjective:   Seen and examined patient at bedside.  Cortrak has been inserted and TF continue.   Doesn't offer much history but denies dyspnea, says diarrhea improved.  No family bedside this AM  Objective Vitals:   05/24/24 1648 05/24/24 2022 05/25/24 0434 05/25/24 0808  BP: 118/60 (!) 138/53 114/74 (!) 154/60  Pulse: 71 75 72 67  Resp: 18 18 18 14   Temp: 98 F (36.7 C) 97.9 F (36.6 C) 99 F (37.2 C) 97.8 F (36.6 C)  TempSrc: Oral Oral Oral Oral  SpO2:  100% 100% 100%  Weight:      Height:       Physical Exam General: Awake with eyes closed, chronically ill-appearing; thin, on RA, NAD Heart: S1 and S2; No murmurs, gallops, rubs Lungs: Clear anteriorly Extremities: No LE edema Dialysis Access: L AVG +t/b  Filed Weights   05/24/24 0837 05/24/24 0858 05/24/24 1230  Weight: (P) 56.4 kg 56.4 kg 55.4 kg    Intake/Output Summary (Last 24 hours) at 05/25/2024 0825 Last data filed at 05/25/2024 9378 Gross per 24 hour  Intake 2174.17 ml  Output 2000 ml  Net 174.17 ml     Additional Objective Labs: Basic Metabolic Panel: Recent Labs  Lab 05/23/24 0346 05/24/24 0403 05/24/24 0946 05/25/24 0410  NA 137  --  134* 134*  K 3.4* 3.7 3.8 3.9  CL 97*  --  98 96*  CO2 27  --  27 28  GLUCOSE 225*  --  270* 252*  BUN 16  --  33* 26*  CREATININE 2.64*  --  3.44* 2.43*  CALCIUM  7.5*  --  7.6* 7.4*  PHOS  --  1.3* 1.3* <1.0*   Liver Function Tests: Recent Labs  Lab 05/20/24 1010 05/21/24 1125 05/22/24 0314 05/24/24 0946 05/25/24 0410  AST  --   --   --   --  15  ALT  --   --   --   --  8  ALKPHOS  --   --   --   --  68  BILITOT  --   --   --   --  0.6  PROT 5.7*  --   --   --  5.4*  ALBUMIN  --    < > <1.5* <1.5* 1.6*   < > = values in this interval not displayed.   No results for input(s): LIPASE, AMYLASE in the last 168 hours.  CBC: Recent Labs  Lab 05/19/24 0440 05/20/24 0555 05/21/24 1125 05/22/24 0314  05/24/24 0946 05/25/24 0410  WBC 21.4* 19.7* 21.7* 22.7* 23.4* 24.2*  NEUTROABS 20.8* 18.5*  --   --   --   --   HGB 8.6* 8.4* 7.6* 7.4* 6.6* 9.2*  HCT 27.7* 27.4* 25.0* 23.8* 22.3* 29.1*  MCV 96.2 95.8 97.7 97.1 100.0 89.5  PLT 263 258 227 201 183 174   Blood Culture    Component Value Date/Time   SDES  05/16/2024 1643    BLOOD BLOOD RIGHT HAND Performed at [facility], [address], [place], [place] [address]    [facility]  05/16/2024 1643    BOTTLES DRAWN AEROBIC AND ANAEROBIC BOTTLES DRAWN AEROBIC ONLY Performed at [facility], [address], [place], [place] [address]    CULT  05/16/2024 1643    NO GROWTH 5 DAYS Performed at [facility], [address] [place], [place] [address]    REPTSTATUS 05/24/2024 FINAL 05/16/2024  1643    Cardiac Enzymes: No results for input(s): CKTOTAL, CKMB, CKMBINDEX, TROPONINI in the last 168 hours. CBG: Recent Labs  Lab 05/23/24 1210 05/23/24 2054 05/24/24 1642 05/24/24 2019 05/25/24 0806  GLUCAP 221* 219* 222* 237* 267*   Iron Studies: No results for input(s): IRON, TIBC, TRANSFERRIN, FERRITIN in the last 72 hours. Lab Results  Component Value Date   INR 1.3 (H) 03/05/2024   INR 1.1 11/03/2019   INR 1.04 05/12/2018   Studies/Results: No results found.   Medications:  feeding supplement (OSMOLITE 1.5 CAL) 1,000 mL (05/25/24 0153)   potassium PHOSPHATE  IVPB (in mmol)       amoxicillin -clavulanate  1 tablet Oral Q12H   apixaban   10 mg Oral BID   Followed by   [name] ON 05/26/2024] apixaban   5 mg Oral BID   atorvastatin   40 mg Oral Daily   carvedilol   25 mg Oral BID WC   Chlorhexidine  Gluconate Cloth  6 each Topical Q0600   cloNIDine   0.3 mg Transdermal Weekly   darbepoetin (ARANESP ) injection - DIALYSIS  60 mcg Subcutaneous Q Fri-1800   diltiazem   360 mg Oral Daily   feeding supplement  1 Container Oral BID BM   feeding supplement (PROSource TF20)  60 mL Per Tube Daily    ferrous sulfate   325 mg Oral Daily   fiber supplement (BANATROL TF)  60 mL Per Tube TID   fidaxomicin   200 mg Oral BID   lactose free nutrition  237 mL Oral Daily   losartan   50 mg Oral Daily   mirtazapine   7.5 mg Oral QHS   multivitamin  1 tablet Oral QHS   pantoprazole   40 mg Oral Daily   sodium chloride  flush  10-40 mL Intracatheter Q12H   thiamine   100 mg Oral Daily   [START ON 05/27/2024] vancomycin   125 mg Oral Daily    Dialysis Orders: MWF [facility]  3.5hrs BFR 400 DFR 500 EDW 53kg 2K/2.5Ca Heparin  bolus 600 units with HD then 400 units/hr Calcitriol  1.41mcg with HD - last dose 9/3 Sensipar 60mg  with HD - last dose 9/3 Micera 75mcg Q 4 weeks - per records, dose just ordered Calcium  Acetate 667mh TID with meals   Assessment/Plan: Colo-uterine fistula: per abd CT. Per surgery/ pmd - appears surgery has signed off and plans outpt elective management; ID recommended augmentin  as well Cdif colitis: per ID on fidaxomicin  LUE edema: LUE duplex ordered by Hospitalist - no issue with LUE but acute non occlusive RIJ clot noted - started on eliquis .  No issues with AVG thus far. Monitor. ESRD: on HD MWF. Next 9/15 HTN: HTN on 5 BP agents. Noted allergy to Amlodipine .  BP on lower side now, d/c'd spironolactone  and remains low, decreased losartan  50%.  Volume: euvolemic on exam, no edema, CXR no edema. Anemia of esrd: has trended down into the 7s, no overt bleeding. Start ESA 9/12. 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 Holding binders, VDRA, and Sensipar for now. This AM phos 1 - giving KPhos IV Dementia Malnutrition: had cortrak inserted and TF started 9/10 GOC: Palliative consulted - full scope at this time it appears - to see how she's doing after a few days of TF, family doesn't seem interested in PEG permanently and I agree.  I would think it would be reasonable to pursue a more palliative approach perhaps even stopping dialysis.       Awaiting  SNF - on hold  give new NG tube and TF  Tracy Barters MD [facility] Pager [phone]

## 2024-05-26 DIAGNOSIS — Z7189 Other specified counseling: Secondary | ICD-10-CM | POA: Diagnosis not present

## 2024-05-26 DIAGNOSIS — A0472 Enterocolitis due to Clostridium difficile, not specified as recurrent: Secondary | ICD-10-CM | POA: Diagnosis not present

## 2024-05-26 DIAGNOSIS — N824 Other female intestinal-genital tract fistulae: Secondary | ICD-10-CM | POA: Diagnosis not present

## 2024-05-26 DIAGNOSIS — Z515 Encounter for palliative care: Secondary | ICD-10-CM | POA: Diagnosis not present

## 2024-05-26 LAB — GLUCOSE, CAPILLARY
Glucose-Capillary: 345 mg/dL — ABNORMAL HIGH (ref 70–99)
Glucose-Capillary: 206 mg/dL — ABNORMAL HIGH (ref 70–99)
Glucose-Capillary: 55 mg/dL — ABNORMAL LOW (ref 70–99)
Glucose-Capillary: 72 mg/dL (ref 70–99)
Glucose-Capillary: 85 mg/dL (ref 70–99)
Glucose-Capillary: 109 mg/dL — ABNORMAL HIGH (ref 70–99)
Glucose-Capillary: 167 mg/dL — ABNORMAL HIGH (ref 70–99)

## 2024-05-26 LAB — MAGNESIUM: Magnesium: 2.1 mg/dL (ref 1.7–2.4)

## 2024-05-26 LAB — POTASSIUM: Potassium: 5.8 mmol/L — ABNORMAL HIGH (ref 3.5–5.1)

## 2024-05-26 LAB — PHOSPHORUS: Phosphorus: 2.5 mg/dL (ref 2.5–4.6)

## 2024-05-26 MED ORDER — SODIUM ZIRCONIUM CYCLOSILICATE 10 G PO PACK
10.0000 g | PACK | Freq: Once | ORAL | Status: AC
  Administered 2024-05-26: 10 g via ORAL
  Filled 2024-05-26: qty 1

## 2024-05-26 MED ORDER — GERHARDT'S BUTT CREAM
TOPICAL_CREAM | Freq: Four times a day (QID) | CUTANEOUS | Status: DC
  Administered 2024-06-09 – 2024-06-14 (×4): 1 via TOPICAL
  Filled 2024-05-26 (×6): qty 60

## 2024-05-26 MED ORDER — OXYCODONE HCL 5 MG PO TABS
5.0000 mg | ORAL_TABLET | ORAL | Status: DC | PRN
  Administered 2024-05-26 – 2024-05-29 (×4): 5 mg via ORAL
  Filled 2024-05-26 (×5): qty 1

## 2024-05-26 NOTE — Progress Notes (Signed)
 [facility] Progress Note   Subjective:   Seen and examined patient at bedside.  Cortrak has been inserted and TF continue.   Doesn't offer much history but denies dyspnea, says diarrhea improved.  No family bedside this AM.   Objective Vitals:   05/25/24 0808 05/25/24 1603 05/25/24 1954 05/26/24 0436  BP: (!) 154/60 (!) 128/55 (!) 108/94 (!) 137/53  Pulse: 67 61 61 62  Resp: 14 18 18 18   Temp: 97.8 F (36.6 C) 97.8 F (36.6 C) 98.1 F (36.7 C) 98 F (36.7 C)  TempSrc: Oral Oral Oral Oral  SpO2: 100% 96% 95% 96%  Weight:      Height:       Physical Exam General: Awake with eyes closed, chronically ill-appearing; thin, on RA, NAD Heart: S1 and S2; No murmurs, gallops, rubs Lungs: Clear anteriorly Extremities: No LE edema Dialysis Access: L AVG +t/b  Filed Weights   05/24/24 0837 05/24/24 0858 05/24/24 1230  Weight: (P) 56.4 kg 56.4 kg 55.4 kg    Intake/Output Summary (Last 24 hours) at 05/26/2024 0911 Last data filed at 05/26/2024 0436 Gross per 24 hour  Intake 1655.95 ml  Output --  Net 1655.95 ml     Additional Objective Labs: Basic Metabolic Panel: Recent Labs  Lab 05/23/24 0346 05/24/24 0403 05/24/24 0946 05/25/24 0410 05/26/24 0457  NA 137  --  134* 134*  --   K 3.4*   < > 3.8 3.9 5.8*  CL 97*  --  98 96*  --   CO2 27  --  27 28  --   GLUCOSE 225*  --  270* 252*  --   BUN 16  --  33* 26*  --   CREATININE 2.64*  --  3.44* 2.43*  --   CALCIUM  7.5*  --  7.6* 7.4*  --   PHOS  --    < > 1.3* <1.0* 2.5   < > = values in this interval not displayed.   Liver Function Tests: Recent Labs  Lab 05/20/24 1010 05/21/24 1125 05/22/24 0314 05/24/24 0946 05/25/24 0410  AST  --   --   --   --  15  ALT  --   --   --   --  8  ALKPHOS  --   --   --   --  68  BILITOT  --   --   --   --  0.6  PROT 5.7*  --   --   --  5.4*  ALBUMIN  --    < > <1.5* <1.5* 1.6*   < > = values in this interval not displayed.   No results for input(s): LIPASE,  AMYLASE in the last 168 hours.  CBC: Recent Labs  Lab 05/20/24 0555 05/21/24 1125 05/22/24 0314 05/24/24 0946 05/25/24 0410  WBC 19.7* 21.7* 22.7* 23.4* 24.2*  NEUTROABS 18.5*  --   --   --   --   HGB 8.4* 7.6* 7.4* 6.6* 9.2*  HCT 27.4* 25.0* 23.8* 22.3* 29.1*  MCV 95.8 97.7 97.1 100.0 89.5  PLT 258 227 201 183 174   Blood Culture    Component Value Date/Time   SDES  05/16/2024 1643    BLOOD BLOOD RIGHT HAND Performed at [facility], [address] [place], [place] [address]    [facility]  05/16/2024 1643    BOTTLES DRAWN AEROBIC AND ANAEROBIC BOTTLES DRAWN AEROBIC ONLY Performed at [facility], [address] [place],  [place] [address]    CULT  05/16/2024 1643    NO GROWTH 5 DAYS Performed at [facility], [address] [place], [place] [address]    REPTSTATUS 05/24/2024 FINAL 05/16/2024 1643    Cardiac Enzymes: No results for input(s): CKTOTAL, CKMB, CKMBINDEX, TROPONINI in the last 168 hours. CBG: Recent Labs  Lab 05/25/24 0806 05/25/24 1208 05/25/24 1712 05/25/24 2121 05/26/24 0837  GLUCAP 267* 288* 348* 288* 345*   Iron Studies: No results for input(s): IRON, TIBC, TRANSFERRIN, FERRITIN in the last 72 hours. Lab Results  Component Value Date   INR 1.3 (H) 03/05/2024   INR 1.1 11/03/2019   INR 1.04 05/12/2018   Studies/Results: No results found.   Medications:  feeding supplement (OSMOLITE 1.5 CAL) 45 mL/hr at 05/26/24 0436     amoxicillin -clavulanate  1 tablet Oral Q12H   apixaban   10 mg Oral BID   Followed by   apixaban   5 mg Oral BID   atorvastatin   40 mg Oral Daily   carvedilol   25 mg Oral BID WC   Chlorhexidine  Gluconate Cloth  6 each Topical Q0600   cloNIDine   0.3 mg Transdermal Weekly   darbepoetin (ARANESP ) injection - DIALYSIS  60 mcg Subcutaneous Q Fri-1800   diltiazem   360 mg Oral Daily   feeding supplement  1 Container Oral BID BM   feeding supplement (PROSource TF20)  60 mL Per  Tube Daily   ferrous sulfate   325 mg Oral Daily   fiber supplement (BANATROL TF)  60 mL Per Tube TID   fidaxomicin   200 mg Oral BID   Gerhardt's butt cream   Topical QID   insulin  aspart  0-15 Units Subcutaneous TID WC   insulin  glargine  10 Units Subcutaneous QHS   lactose free nutrition  237 mL Oral Daily   losartan   50 mg Oral Daily   mirtazapine   7.5 mg Oral QHS   multivitamin  1 tablet Oral QHS   pantoprazole   40 mg Oral Daily   sodium chloride  flush  10-40 mL Intracatheter Q12H   thiamine   100 mg Oral Daily   [START ON 05/27/2024] vancomycin   125 mg Oral Daily    Dialysis Orders: MWF [facility]  3.5hrs BFR 400 DFR 500 EDW 53kg 2K/2.5Ca Heparin  bolus 600 units with HD then 400 units/hr Calcitriol  1.25mcg with HD - last dose 9/3 Sensipar 60mg  with HD - last dose 9/3 Micera 75mcg Q 4 weeks - per records, dose just ordered Calcium  Acetate 667mh TID with meals   Assessment/Plan: Colo-uterine fistula: per abd CT. Per surgery/ pmd - appears surgery has signed off and plans outpt elective management; ID recommended augmentin  as well Cdif colitis: per ID on fidaxomicin  LUE edema: LUE duplex ordered by Hospitalist - no issue with LUE but acute non occlusive RIJ clot noted - started on eliquis .  No issues with AVG thus far. Monitor. ESRD: on HD MWF. Next 9/15 HTN: HTN on 5 BP agents. Noted allergy to Amlodipine .  BP on lower side now, d/c'd spironolactone  and remains low, decreased losartan  50%.  Volume: euvolemic on exam, no edema, CXR no edema. Anemia of esrd: has trended down into the 7s, no overt bleeding. Start ESA 9/12. 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 Holding binders, VDRA, and Sensipar for now. This AM phos 1 - giving KPhos IV Dementia Malnutrition: had cortrak inserted and TF started 9/10 Hyperkalemia: K 5.8 this AM, lokelma  10g x 1 dose,  RD can change TF if remains issue GOC: Palliative consulted - full scope at this time it appears -  to see how she's doing after a few days of TF, family doesn't seem interested in PEG permanently and I agree.  I would think it would be reasonable to pursue a more palliative approach perhaps even stopping dialysis.      Manuelita Barters MD [facility] Pager [phone]

## 2024-05-26 NOTE — Plan of Care (Signed)
  Problem: Health Behavior/Discharge Planning: Goal: Ability to manage health-related needs will improve Outcome: Progressing   Problem: Clinical Measurements: Goal: Ability to maintain clinical measurements within normal limits will improve Outcome: Progressing Goal: Will remain free from infection Outcome: Progressing Goal: Diagnostic test results will improve Outcome: Progressing   Problem: Nutrition: Goal: Adequate nutrition will be maintained Outcome: Progressing   Problem: Pain Managment: Goal: General experience of comfort will improve and/or be controlled Outcome: Progressing   Problem: Safety: Goal: Ability to remain free from injury will improve Outcome: Progressing

## 2024-05-26 NOTE — Progress Notes (Addendum)
 Pt Blood glucose 55mg /dL - Pt feed ice cream and boost improve to 72 mg/dL,  Pt ate a bit of dinner ad orange juice it improve to 85 mg/dL. Checked after 45 minutess blood glucose went up to 109 mg/dL.  Encouraged PT to eat.

## 2024-05-26 NOTE — Progress Notes (Addendum)
 [facility]  PROGRESS NOTE  Tracy Jennings FMW:[id number] DOB: June 12, 1956 DOA: 05/16/2024 PCP: Zachary Idelia LABOR, MD   Brief HPI:   68 year old F with PMH of cognitive impairment, ESRD on HD MWF, cervical cancer, HTN, HLD, anemia and recent hospitalizations from 6/24-7/22 for hypertensive urgency and delirium, and 8/17-8/24 for C. difficile colitis for which she was treated with p.o. vancomycin  brought back to [facility] from PCP office due to weakness, nausea, diarrhea and abdominal discomfort.   ID consulted for persistent leukocytosis and started Augmentin  and ordered CT chest that showed small slightly loculated appearing left pleural effusion, subpleural rounded consolidation at lingula, ovoid masslike consolidation at the anterior left lung base contagious with curvilinear consolidation posteriorly suggesting atelectasis, patchy areas of mild bilateral ground glass density and anasarca.     Assessment/Plan:   C. difficile colitis: Completed 10 days of oral vancomycin  about a week ago.  Ongoing abdominal pain and diarrhea.  CT suggested rectosigmoid colitis.  She has loose stool but not frequently.  Abdominal exam benign.  Still with significant leukocytosis - Continue Dificid  - Continue soft diet. As per ID recommendation  -Continue Augmentin  to complete 10 days on antibiotics for possible pneumonia with possible or loculation EOT 9/17.  Patient reports minimal respiratory symptoms - Complete Dificid  x 10 days then vancomycin  p.o. daily 7 days out form completing Augmentin .  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  Abdominal exam benign. - General Surgery recommends elective management and treating C. difficile. - Continue soft diet. -Tube feed was inserted, patient continues to have persistent liquid stools from the colon uterine fistula passing through the vagina. -Patient is in severe pain and discomfort, discussed with family, will hold tube feeding at this time.   Patient is eating better, will let her eat by mouth.   Persistent leukocytosis/bandemia: Persisted despite treatment of C. difficile.  Has no respiratory symptoms but chronic left pleural effusion which was felt to be small to tap.  Left forearm swelling but no signs of infection.  Blood culture negative on 9/4. Incidental right IJ acute DVT.  CT showed small slightly loculated appearing left pleural effusion and masslike consolidations as above. -Appreciate help by ID-started p.o. Augmentin  -Encourage incentive telemetry -Needs repeat CT in 4 to 6 weeks for LLL mass.   Loculated left pleural effusion/masslike consolidation: Seems chronic.  Has no respiratory symptoms.  IR consulted for thoracocentesis but pleural effusion felt to be small to tap safely. CT chest finding as above.   - Now on p.o. Augmentin  per ID - Encourage incentive telemetry - Needs repeat CT in 4 to 6 weeks  Diabetes mellitus -Glucose went up to 340s, started on sliding scale insulin  NovoLog  -Will hold Lantus  due to persistent diarrhea and stopping tube feeds -Continue sliding scale insulin  NovoLog  -Check hemoglobin A1c   Dementia without behavioral disturbance: Oriented to self and family. Failure to thrive/poor appetite/unintentional weight loss: family reports significant weight loss since her hospitalization in July 2025.  Not able to quantify.  Appetite remains poor even after addition of low-dose Remeron .  Dietitian recommends tube feed.  Tube feed was started.  However patient has liquid stools.  Will hold tube feeding at this time as above. -Continue low-dose Remeron  -Appreciate input by dietitian-recommends cortrack and tube feed. -Appreciate input by palliative-full code with full scope of care per family   Uncontrolled hypertension: Improved  -Continue home clonidine  patch, losartan , Coreg , hydralazine  and Aldactone  -Cardizem  increased from 180 mg daily to 360 mg daily on 9/5 -  Ultrafiltration per  nephrology. -Continue monitoring   Acute right IJ DVT: Incidental finding.  Venous Doppler was ordered for left forearm swelling but showed acute right IJ DVT. - Started on Eliquis    ESRD on HD MWF-last HD on Wednesday.  No emergent need. - Per nephrology.   Anemia of renal disease: H&H relatively stable.  No overt bleeding such as melena or hematochezia. - Hemoglobin dropped to 6.6, status post 1 unit PRBC  - Hemoglobin up to 9.2 today  - Follow CBC in a.m.      Physical deconditioning - Therapy recommended SNF.  Seems family is in agreement now   History of uterine cancer: - Outpatient follow-up.   Hyperkalemia -Potassium is 5.8 today -Lokelma  ordered per nephrology   Severe malnutrition/failure to thrive/hypoalbuminemia Body mass index is 22.77 kg/m. Nutrition Problem: Severe Malnutrition Etiology: chronic illness Signs/Symptoms: severe muscle depletion, severe fat depletion, meal completion < 25%, per patient/family report, energy intake < or equal to 75% for > or equal to 1 month Interventions: Refer to RD note for recommendations    Medications     amoxicillin -clavulanate  1 tablet Oral Q12H   apixaban   10 mg Oral BID   Followed by   apixaban   5 mg Oral BID   atorvastatin   40 mg Oral Daily   carvedilol   25 mg Oral BID WC   Chlorhexidine  Gluconate Cloth  6 each Topical Q0600   cloNIDine   0.3 mg Transdermal Weekly   darbepoetin (ARANESP ) injection - DIALYSIS  60 mcg Subcutaneous Q Fri-1800   diltiazem   360 mg Oral Daily   feeding supplement  1 Container Oral BID BM   feeding supplement (PROSource TF20)  60 mL Per Tube Daily   ferrous sulfate   325 mg Oral Daily   fiber supplement (BANATROL TF)  60 mL Per Tube TID   fidaxomicin   200 mg Oral BID   Gerhardt's butt cream   Topical QID   insulin  aspart  0-15 Units Subcutaneous TID WC   insulin  glargine  10 Units Subcutaneous QHS   lactose free nutrition  237 mL Oral Daily   losartan   50 mg Oral Daily    mirtazapine   7.5 mg Oral QHS   multivitamin  1 tablet Oral QHS   pantoprazole   40 mg Oral Daily   sodium chloride  flush  10-40 mL Intracatheter Q12H   thiamine   100 mg Oral Daily   [START ON 05/27/2024] vancomycin   125 mg Oral Daily     Data Reviewed:   CBG:  Recent Labs  Lab 05/25/24 0806 05/25/24 1208 05/25/24 1712 05/25/24 2121 05/26/24 0837  GLUCAP 267* 288* 348* 288* 345*    SpO2: 96 %    Vitals:   05/25/24 0808 05/25/24 1603 05/25/24 1954 05/26/24 0436  BP: (!) 154/60 (!) 128/55 (!) 108/94 (!) 137/53  Pulse: 67 61 61 62  Resp: 14 18 18 18   Temp: 97.8 F (36.6 C) 97.8 F (36.6 C) 98.1 F (36.7 C) 98 F (36.7 C)  TempSrc: Oral Oral Oral Oral  SpO2: 100% 96% 95% 96%  Weight:      Height:          Data Reviewed:  Basic Metabolic Panel: Recent Labs  Lab 05/21/24 1125 05/22/24 0314 05/23/24 0346 05/24/24 0403 05/24/24 0946 05/25/24 0410 05/26/24 0457  NA 137 134* 137  --  134* 134*  --   K 3.5 3.2* 3.4* 3.7 3.8 3.9 5.8*  CL 97* 98 97*  --  98 96*  --  CO2 27 27 27   --  27 28  --   GLUCOSE 139* 122* 225*  --  270* 252*  --   BUN 19 24* 16  --  33* 26*  --   CREATININE 3.27* 3.77* 2.64*  --  3.44* 2.43*  --   CALCIUM  7.7* 7.6* 7.5*  --  7.6* 7.4*  --   MG 1.7 1.7  --  1.7  --  1.6* 2.1  PHOS 2.7 2.9  --  1.3* 1.3* <1.0* 2.5    CBC: Recent Labs  Lab 05/20/24 0555 05/21/24 1125 05/22/24 0314 05/24/24 0946 05/25/24 0410  WBC 19.7* 21.7* 22.7* 23.4* 24.2*  NEUTROABS 18.5*  --   --   --   --   HGB 8.4* 7.6* 7.4* 6.6* 9.2*  HCT 27.4* 25.0* 23.8* 22.3* 29.1*  MCV 95.8 97.7 97.1 100.0 89.5  PLT 258 227 201 183 174    LFT Recent Labs  Lab 05/20/24 0555 05/20/24 1010 05/21/24 1125 05/22/24 0314 05/24/24 0946 05/25/24 0410  AST  --   --   --   --   --  15  ALT  --   --   --   --   --  8  ALKPHOS  --   --   --   --   --  68  BILITOT  --   --   --   --   --  0.6  PROT  --  5.7*  --   --   --  5.4*  ALBUMIN 1.6*  --  1.6* <1.5* <1.5*  1.6*     Antibiotics: Anti-infectives (From admission, onward)    Start     Dose/Rate Route Frequency Ordered Stop   05/27/24 1000  vancomycin  (VANCOCIN ) capsule 125 mg        125 mg Oral Daily 05/21/24 1019 06/04/24 0959   05/20/24 1530  amoxicillin -clavulanate (AUGMENTIN ) 500-125 MG per tablet 1 tablet        1 tablet Oral Every 12 hours 05/20/24 1519 05/30/24 0959   05/17/24 0100  fidaxomicin  (DIFICID ) tablet 200 mg        200 mg Oral 2 times daily 05/17/24 0003 05/26/24 2159   05/17/24 0100  metroNIDAZOLE  (FLAGYL ) IVPB 500 mg  Status:  Discontinued        500 mg 100 mL/hr over 60 Minutes Intravenous Every 12 hours 05/17/24 0003 05/20/24 1519        DVT prophylaxis: Apixaban   Code Status: Full code  Family Communication: Discussed with patient's husband at bedside   CONSULTS nephrology   Subjective   Patient continues to have significant diarrhea.  She has Colo uterine fistula and is passing stool through the vagina.  Complains of significant discomfort in the vagina with swelling of vulva.   Objective    Physical Examination:   Appears in no acute distress GU-significant labial swelling noted, stool passing through vagina Abdomen is soft, nontender Extremities no edema  Status is: Inpatient:             Tracy Jennings   [facility] If 7PM-7AM, please contact night-coverage at [email or web address], Office  [phone]   05/26/2024, 8:49 AM  LOS: 10 days

## 2024-05-26 NOTE — Progress Notes (Addendum)
 Daily Progress Note   Patient Name: Tracy Jennings       Date: 05/26/2024 DOB: 17-Dec-1955  Age: 68 y.o. MRN#: [id number] Attending Physician: Drusilla Sabas RAMAN, MD Primary Care Physician: Zachary Idelia LABOR, MD Admit Date: 05/16/2024  Reason for Consultation/Follow-up: Establishing goals of care  Subjective: Medical records reviewed including progress notes, labs and imaging. Patient assessed at the bedside. She reports severe 10/10 pain on her bottom. Her brother and two sisters are present visiting, shared that RN was requesting stronger pain medicine from primary attending. Discussed with MD.  Created space and opportunity for patient and family's thoughts and feelings on patient's current illness. Family understands that artificial nutrition is hurting more than it is helping Valarie. She has a lot of irritation from her loose stools. She is in a lot more pain, especially today, compared to a few days ago. At the same time, they are somewhat encouraged that she is eating a little bit better.   We discussed that patient would not benefit from permanent artificial nutrition given how poorly the temporary trial has gone. They agree. I explored thoughts on focus of patient's care, whether quantity of life or quality of life, and family's readiness for possible comfort-care or hospice. One of her sisters looked surprised this was mentioned. Overall, her siblings would like to see if she can eat more over the next few days and then revisit goals of care when my colleague is back on service. They do want to try a stronger pain medication in the meantime but also still try to aim for further recovery.  Questions and concerns addressed. PMT will continue to support holistically.   Length of Stay: 10   Physical Exam Vitals and nursing note reviewed.   Constitutional:      Appearance: She is ill-appearing.     Comments: Grimacing in pain  HENT:     Head: Normocephalic.  Cardiovascular:     Rate and Rhythm: Normal rate.  Pulmonary:     Effort: Pulmonary effort is normal.  Neurological:     Mental Status: She is alert.  Psychiatric:     Comments: Restless             Vital Signs: BP (!) 137/46   Pulse 62   Temp 98 F (36.7 C) (Axillary)   Resp 16   Ht 5' 3 (1.6 m)   Wt 55.4 kg   LMP  (LMP Unknown)   SpO2 100%   BMI 21.64 kg/m  SpO2: SpO2: 100 % O2 Device: O2 Device: Room Air O2 Flow Rate:        Palliative Assessment/Data: 30% (on tube feeds)   Palliative Care Assessment & Plan   Patient Profile: 68 y.o. female  with past medical history of cognitive impairment, ESRD on HD MWF, cervical cancer, HTN, HLD, anemia and recent hospitalizations from 6/24-7/22 for hypertensive urgency and delirium, and 8/17-8/24 for C. difficile colitis for which she was treated with p.o. vancomycin   admitted on 05/16/2024 with weakness, nausea, diarrhea and abdominal discomfort. CT abdomen and pelvis showed circumferential wall thickening of rectosigmoid colon suspicious for colitis, Colo uterine fistula, increased bilateral pleural effusions, left > right and area of rounded  density in anterior LLL measuring 3.1 cm, and progressive anasarca.  General surgery at [facility] was consulted and recommended higher level of care and the ER physician discussed with Dr. Dasie general surgeon at [facility] who agreed to see patient in consult. Patient was started on Dificid  and Flagyl  for C. difficile colitis.  Evaluated by general surgery - urgent surgical intervention is not indicated. PMT consulted to discuss GOC.    Assessment: Goals of care conversation FTT C diff colitis Dementia ESRD on HD Acute right IJ DVT Acute on chronic anemia  Recommendations/Plan: Continue full code/full scope treatment Patient's family agreed to discontinue cortrak and  would like to allow more time for outcomes before considering de-escalation of care or comfort-focused care No PEG Psychosocial and emotional support provided PMT will see again on 9/17 for continued GOC conversations with my colleague [name] NP. Please reach out to team line if urgent needs arise in the interim    Prognosis:  Poor  Discharge Planning: To Be Determined   Care plan was discussed with Patient, patient's 3 siblings, MD          Mickle SHAUNNA Fell, PA-C  Palliative Medicine Team Team phone # [phone]  Thank you for allowing the Palliative Medicine Team to assist in the care of this patient. Please utilize secure chat with additional questions, if there is no response within 30 minutes please call the above phone number.  Palliative Medicine Team providers are available by phone from 7am to 7pm daily and can be reached through the team cell phone.  Should this patient require assistance outside of these hours, please call the patient's attending physician.    Time Total: 35  Visit consisted of counseling and education dealing with the complex and emotionally intense issues of symptom management and palliative care in the setting of serious and potentially life-threatening illness. Greater than 50% of this time was spent counseling and coordinating care related to the above assessment and plan.  Personally spent 35 minutes in patient care including extensive chart review (labs, imaging, progress/consult notes, vital signs), medically appropraite exam, discussed with treatment team, education to patient, family, and staff, documenting clinical information, medication review and management, coordination of care, and available advanced directive documents.

## 2024-05-27 DIAGNOSIS — A0472 Enterocolitis due to Clostridium difficile, not specified as recurrent: Secondary | ICD-10-CM | POA: Diagnosis not present

## 2024-05-27 DIAGNOSIS — Z515 Encounter for palliative care: Secondary | ICD-10-CM | POA: Diagnosis not present

## 2024-05-27 DIAGNOSIS — N824 Other female intestinal-genital tract fistulae: Secondary | ICD-10-CM | POA: Diagnosis not present

## 2024-05-27 DIAGNOSIS — Z7189 Other specified counseling: Secondary | ICD-10-CM | POA: Diagnosis not present

## 2024-05-27 LAB — GLUCOSE, CAPILLARY
Glucose-Capillary: 113 mg/dL — ABNORMAL HIGH (ref 70–99)
Glucose-Capillary: 124 mg/dL — ABNORMAL HIGH (ref 70–99)
Glucose-Capillary: 153 mg/dL — ABNORMAL HIGH (ref 70–99)
Glucose-Capillary: 185 mg/dL — ABNORMAL HIGH (ref 70–99)
Glucose-Capillary: 67 mg/dL — ABNORMAL LOW (ref 70–99)

## 2024-05-27 LAB — BASIC METABOLIC PANEL WITH GFR
Anion gap: 11 (ref 5–15)
BUN: 54 mg/dL — ABNORMAL HIGH (ref 8–23)
CO2: 28 mmol/L (ref 22–32)
Calcium: 7.4 mg/dL — ABNORMAL LOW (ref 8.9–10.3)
Chloride: 91 mmol/L — ABNORMAL LOW (ref 98–111)
Creatinine, Ser: 3.55 mg/dL — ABNORMAL HIGH (ref 0.44–1.00)
GFR, Estimated: 14 mL/min — ABNORMAL LOW (ref >60)
Glucose, Bld: 154 mg/dL — ABNORMAL HIGH (ref 70–99)
Potassium: 6 mmol/L — ABNORMAL HIGH (ref 3.5–5.1)
Sodium: 130 mmol/L — ABNORMAL LOW (ref 135–145)

## 2024-05-27 LAB — HEMOGLOBIN A1C
Hgb A1c MFr Bld: 5.4 % (ref 4.8–5.6)
Mean Plasma Glucose: 108.28 mg/dL

## 2024-05-27 MED ORDER — SODIUM ZIRCONIUM CYCLOSILICATE 10 G PO PACK
10.0000 g | PACK | ORAL | Status: AC
  Administered 2024-05-27: 10 g via ORAL
  Filled 2024-05-27: qty 1

## 2024-05-27 NOTE — Progress Notes (Addendum)
 Nutrition Follow-up  DOCUMENTATION CODES:   Severe malnutrition in context of chronic illness  INTERVENTION:  Discontinue tube feeding via Cortrak  Encourage PO intake - Currently on Dysphagia 2, thin liquids  Nursing to assist with meals  Boost Breeze po BID, each supplement provides 250 kcal and 9 grams of protein Boost Plus daily , Each supplement provides 360 kcal and 14 gm protein Rena-Vite daily  100 mg Thiamine  daily  If within GOC, TPN may be an option  *Per family, pt is lactose intolerant   NUTRITION DIAGNOSIS:   Severe Malnutrition related to chronic illness as evidenced by severe muscle depletion, severe fat depletion, meal completion < 25%, per patient/family report, energy intake < or equal to 75% for > or equal to 1 month. - Ongoing   GOAL:   Patient will meet greater than or equal to 90% of their needs - Ongoing   MONITOR:   PO intake, Supplement acceptance, Labs, I & O's  REASON FOR ASSESSMENT:   Consult Assessment of nutrition requirement/status  ASSESSMENT:   Pt with PMH of dementia, ESRD on HD MWF, cervical ca, HTN, HLD, anemia, recent hospitalizations from 6/24 - 7/22 for hypertensive urgency and delirium, and 8/17 - 8/24 for Cdiff colitis now admitted from home with abd discomfort, weakness, Nausea, and diarrhea.   9/4 - CT of abd and pelvis showed rectosigmoid colon suspicious for colitis, colo-uterine fistula, increased bilateral pleural effusions, and progressive anasarca. Surgery, Dasie, consulted and recommends continued treatment for cdiff and outpt follow up and ok for diet advancement.  9/8 - Left pleural effusion too small for Thoracocentesis, concern for malignancy. HD today  9/10 - Cortrak placed (needs to be confirmed by x-ray before use), TF to be started, HD today  9/14 - tube feeds held 9/15 - Tube feeds discontinued, cortrak only for meds  Pt in HD. Brother and sister at bedside, they report pt was doing well Friday and Saturday but  then started going downhill yesterday from having multiple bowel movements that were causing her immense pain. Even though pt was doing better Friday and Saturday, pt's intake is still minimal. Family reports she only had 25% of chicken and mashed potatoes and 100% of tomato soup on Saturday. Pt did not eat or drink anything yesterday or anything today. Per RN has been refusing meds and supplements.   Palliative had meeting with family yesterday and discussed how artifical nutrition is hurting more than it is helping as pt has had a lot of irritation from her loose stools. More so caused by C.diff infection and colouterine fistula compared to her tube feeds. Pt continues to be in pain. RD reached out to palliative and MD to discuss plan moving forward. Tube feeds to be discontinued and cortrak to only be used for medications for now. Family wants to see if pt will eat more.   Per MD, pt most likely not a surgical candidate and surgery was planning to see her outpatient for her colouterine fistula and infection. Likely t will not be strong enough for outpatient, may need TPN if within GOC?  Admit weight: 57 kg -Fluid? Current weight: 55.4 kg  EDW: 53 kg  Average Meal Intake: 9/5-9/9: 6% intake x 4 recorded meals 9/10-9/12: 0% intake x 2 recorded meals  Nutritionally Relevant Medications: Scheduled Meds:  amoxicillin -clavulanate  1 tablet Oral Q12H   feeding supplement  1 Container Oral BID BM   ferrous sulfate   325 mg Oral Daily   insulin  aspart  0-15  Units Subcutaneous TID WC   lactose free nutrition  237 mL Oral Daily   losartan   50 mg Oral Daily   mirtazapine   7.5 mg Oral QHS   multivitamin  1 tablet Oral QHS   thiamine   100 mg Oral Daily   vancomycin   125 mg Oral Daily   Labs Reviewed: Sodium 130 Potassium 6.0 BUN 54 Creatinine 3.55 Calcium  7.4 GFR 14 CBG ranges from 72-167 mg/dL over the last 24 hours HgbA1c 5.4  Diet Order:   Diet Order             DIET DYS 2 Room  service appropriate? Yes; Fluid consistency: Thin  Diet effective now                   EDUCATION NEEDS:   Not appropriate for education at this time  Skin:  Skin Assessment: Reviewed RN Assessment  Last BM:  05/26/2024 x 5, type 7  Height:   Ht Readings from Last 1 Encounters:  05/16/24 5' 3 (1.6 m)    Weight:   Wt Readings from Last 1 Encounters:  05/24/24 55.4 kg    BMI:  Body mass index is 21.64 kg/m.  Estimated Nutritional Needs:   Kcal:  1600-1900 kcal  Protein:  80-100 gm  Fluid:  1 L + UOP   Olivia Kenning, RD Registered Dietitian  See Amion for more information

## 2024-05-27 NOTE — Progress Notes (Signed)
 [facility]  PROGRESS NOTE  Tracy Jennings FMW:[id number] DOB: 1956/08/29 DOA: 05/16/2024 PCP: Zachary Idelia LABOR, MD   Brief HPI:   68 year old F with PMH of cognitive impairment, ESRD on HD MWF, cervical cancer, HTN, HLD, anemia and recent hospitalizations from 6/24-7/22 for hypertensive urgency and delirium, and 8/17-8/24 for C. difficile colitis for which she was treated with p.o. vancomycin  brought back to [facility] from PCP office due to weakness, nausea, diarrhea and abdominal discomfort.   ID consulted for persistent leukocytosis and started Augmentin  and ordered CT chest that showed small slightly loculated appearing left pleural effusion, subpleural rounded consolidation at lingula, ovoid masslike consolidation at the anterior left lung base contagious with curvilinear consolidation posteriorly suggesting atelectasis, patchy areas of mild bilateral ground glass density and anasarca.     Assessment/Plan:   C. difficile colitis: Completed 10 days of oral vancomycin  about a week ago.  Ongoing abdominal pain and diarrhea.  CT suggested rectosigmoid colitis.  She has loose stool but not frequently.  Abdominal exam benign.  Still with significant leukocytosis - Continue Dificid  - Continue soft diet. As per ID recommendation  -Continue Augmentin  to complete 10 days on antibiotics for possible pneumonia with possible or loculation EOT 9/17.  Patient reports minimal respiratory symptoms - Complete Dificid  x 10 days then vancomycin  p.o. daily 7 days out form completing Augmentin .  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  Abdominal exam benign. - General Surgery recommends elective management and treating C. difficile. - Continue soft diet. -Tube feed was inserted, patient continues to have persistent liquid stools from the colon uterine fistula passing through the vagina. -Patient is in severe pain and discomfort, discussed with family, will hold tube feeding at this time.   Patient is eating better, will let her eat by mouth.   Persistent leukocytosis/bandemia: Persisted despite treatment of C. difficile.  Has no respiratory symptoms but chronic left pleural effusion which was felt to be small to tap.  Left forearm swelling but no signs of infection.  Blood culture negative on 9/4. Incidental right IJ acute DVT.  CT showed small slightly loculated appearing left pleural effusion and masslike consolidations as above. -Appreciate help by ID-started p.o. Augmentin  -Encourage incentive telemetry -Needs repeat CT in 4 to 6 weeks for LLL mass.   Loculated left pleural effusion/masslike consolidation: Seems chronic.  Has no respiratory symptoms.  IR consulted for thoracocentesis but pleural effusion felt to be small to tap safely. CT chest finding as above.   - Now on p.o. Augmentin  per ID - Encourage incentive telemetry - Needs repeat CT in 4 to 6 weeks  Diabetes mellitus -Glucose went up to 340s, started on sliding scale insulin  NovoLog  -Will hold Lantus  due to persistent diarrhea and stopping tube feeds -Continue sliding scale insulin  NovoLog  -Check hemoglobin A1c   Dementia without behavioral disturbance: Oriented to self and family. Failure to thrive/poor appetite/unintentional weight loss: family reports significant weight loss since her hospitalization in July 2025.  Not able to quantify.  Appetite remains poor even after addition of low-dose Remeron .  Dietitian recommends tube feed.  Tube feed was started.  However patient has liquid stools.  Will hold tube feeding at this time as above. -Continue low-dose Remeron  -Appreciate input by dietitian-recommends cortrack and tube feed. -Appreciate input by palliative-full code with full scope of care per family   Uncontrolled hypertension: Improved  -Continue home clonidine  patch, losartan , Coreg , hydralazine  and Aldactone  -Cardizem  increased from 180 mg daily to 360 mg daily on 9/5 -  Ultrafiltration per  nephrology. -Continue monitoring   Acute right IJ DVT: Incidental finding.  Venous Doppler was ordered for left forearm swelling but showed acute right IJ DVT. - Started on Eliquis    ESRD on HD MWF-last HD on Wednesday.  No emergent need. - Per nephrology.   Anemia of renal disease: H&H relatively stable.  No overt bleeding such as melena or hematochezia. - Hemoglobin dropped to 6.6, status post 1 unit PRBC  - Hemoglobin up to 9.2 today  - Follow CBC in a.m.      Physical deconditioning - Therapy recommended SNF.  Seems family is in agreement now   History of uterine cancer: - Outpatient follow-up.   Hyperkalemia -Potassium is 5.8 today -Lokelma  ordered per nephrology   Severe malnutrition/failure to thrive/hypoalbuminemia Body mass index is 22.77 kg/m. Nutrition Problem: Severe Malnutrition Etiology: chronic illness Signs/Symptoms: severe muscle depletion, severe fat depletion, meal completion < 25%, per patient/family report, energy intake < or equal to 75% for > or equal to 1 month Interventions: Refer to RD note for recommendations  Goals of care Palliative care consulted for goals of care  Medications     amoxicillin -clavulanate  1 tablet Oral Q12H   apixaban   5 mg Oral BID   atorvastatin   40 mg Oral Daily   carvedilol   25 mg Oral BID WC   Chlorhexidine  Gluconate Cloth  6 each Topical Q0600   cloNIDine   0.3 mg Transdermal Weekly   darbepoetin (ARANESP ) injection - DIALYSIS  60 mcg Subcutaneous Q Fri-1800   diltiazem   360 mg Oral Daily   feeding supplement  1 Container Oral BID BM   feeding supplement (PROSource TF20)  60 mL Per Tube Daily   ferrous sulfate   325 mg Oral Daily   fiber supplement (BANATROL TF)  60 mL Per Tube TID   Gerhardt's butt cream   Topical QID   insulin  aspart  0-15 Units Subcutaneous TID WC   lactose free nutrition  237 mL Oral Daily   losartan   50 mg Oral Daily   mirtazapine   7.5 mg Oral QHS   multivitamin  1 tablet Oral QHS    pantoprazole   40 mg Oral Daily   sodium chloride  flush  10-40 mL Intracatheter Q12H   thiamine   100 mg Oral Daily   vancomycin   125 mg Oral Daily     Data Reviewed:   CBG:  Recent Labs  Lab 05/26/24 1850 05/26/24 2037 05/27/24 0055 05/27/24 0418 05/27/24 0804  GLUCAP 109* 167* 185* 124* 113*    SpO2: 97 %    Vitals:   05/26/24 0930 05/26/24 1653 05/26/24 2025 05/27/24 0442  BP: (!) 137/46 (!) 105/47 (!) 126/49 (!) 133/54  Pulse: 62 62 64 62  Resp: 16 16 18 18   Temp: 98 F (36.7 C) 97.9 F (36.6 C) 98.3 F (36.8 C) 98.2 F (36.8 C)  TempSrc: Axillary Axillary Oral Oral  SpO2: 100% 96% 97% 97%  Weight:      Height:          Data Reviewed:  Basic Metabolic Panel: Recent Labs  Lab 05/21/24 1125 05/22/24 0314 05/23/24 0346 05/24/24 0403 05/24/24 0946 05/25/24 0410 05/26/24 0457 05/27/24 0221  NA 137 134* 137  --  134* 134*  --  130*  K 3.5 3.2* 3.4* 3.7 3.8 3.9 5.8* 6.0*  CL 97* 98 97*  --  98 96*  --  91*  CO2 27 27 27   --  27 28  --  28  GLUCOSE  139* 122* 225*  --  270* 252*  --  154*  BUN 19 24* 16  --  33* 26*  --  54*  CREATININE 3.27* 3.77* 2.64*  --  3.44* 2.43*  --  3.55*  CALCIUM  7.7* 7.6* 7.5*  --  7.6* 7.4*  --  7.4*  MG 1.7 1.7  --  1.7  --  1.6* 2.1  --   PHOS 2.7 2.9  --  1.3* 1.3* <1.0* 2.5  --     CBC: Recent Labs  Lab 05/21/24 1125 05/22/24 0314 05/24/24 0946 05/25/24 0410  WBC 21.7* 22.7* 23.4* 24.2*  HGB 7.6* 7.4* 6.6* 9.2*  HCT 25.0* 23.8* 22.3* 29.1*  MCV 97.7 97.1 100.0 89.5  PLT 227 201 183 174    LFT Recent Labs  Lab 05/20/24 1010 05/21/24 1125 05/22/24 0314 05/24/24 0946 05/25/24 0410  AST  --   --   --   --  15  ALT  --   --   --   --  8  ALKPHOS  --   --   --   --  68  BILITOT  --   --   --   --  0.6  PROT 5.7*  --   --   --  5.4*  ALBUMIN  --  1.6* <1.5* <1.5* 1.6*     Antibiotics: Anti-infectives (From admission, onward)    Start     Dose/Rate Route Frequency Ordered Stop   05/27/24 1000   vancomycin  (VANCOCIN ) capsule 125 mg        125 mg Oral Daily 05/21/24 1019 06/04/24 0959   05/20/24 1530  amoxicillin -clavulanate (AUGMENTIN ) 500-125 MG per tablet 1 tablet        1 tablet Oral Every 12 hours 05/20/24 1519 05/30/24 0959   05/17/24 0100  fidaxomicin  (DIFICID ) tablet 200 mg        200 mg Oral 2 times daily 05/17/24 0003 05/26/24 1037   05/17/24 0100  metroNIDAZOLE  (FLAGYL ) IVPB 500 mg  Status:  Discontinued        500 mg 100 mL/hr over 60 Minutes Intravenous Every 12 hours 05/17/24 0003 05/20/24 1519        DVT prophylaxis: Apixaban   Code Status: Full code  Family Communication: Discussed with patient's husband at bedside   CONSULTS nephrology   Subjective   Tube feed was stopped yesterday due to ongoing liquid stools.  Patient has poor p.o. intake   Objective    Physical Examination:  Appears lethargic S1-S2, regular Lungs clear to auscultation bilaterally  Status is: Inpatient:             Sabas GORMAN Brod   [facility] If 7PM-7AM, please contact night-coverage at [email or web address], Office  [phone]   05/27/2024, 8:23 AM  LOS: 11 days

## 2024-05-27 NOTE — Progress Notes (Signed)
  [facility] Progress Note   Subjective:   Seen in room - curled up on side, answers questions a little, will not change position for exam. NG tube in place.  Objective Vitals:   05/26/24 0930 05/26/24 1653 05/26/24 2025 05/27/24 0442  BP: (!) 137/46 (!) 105/47 (!) 126/49 (!) 133/54  Pulse: 62 62 64 62  Resp: 16 16 18 18   Temp: 98 F (36.7 C) 97.9 F (36.6 C) 98.3 F (36.8 C) 98.2 F (36.8 C)  TempSrc: Axillary Axillary Oral Oral  SpO2: 100% 96% 97% 97%  Weight:      Height:       Physical Exam General: Frail woman, curled on side, NG tube in place Heart: RRR Lungs: CTAB Abdomen: unable to examine Extremities: no LE edema Dialysis Access: unable to examine  Additional Objective Labs: Basic Metabolic Panel: Recent Labs  Lab 05/24/24 0946 05/25/24 0410 05/26/24 0457 05/27/24 0221  NA 134* 134*  --  130*  K 3.8 3.9 5.8* 6.0*  CL 98 96*  --  91*  CO2 27 28  --  28  GLUCOSE 270* 252*  --  154*  BUN 33* 26*  --  54*  CREATININE 3.44* 2.43*  --  3.55*  CALCIUM  7.6* 7.4*  --  7.4*  PHOS 1.3* <1.0* 2.5  --    Liver Function Tests: Recent Labs  Lab 05/22/24 0314 05/24/24 0946 05/25/24 0410  AST  --   --  15  ALT  --   --  8  ALKPHOS  --   --  68  BILITOT  --   --  0.6  PROT  --   --  5.4*  ALBUMIN <1.5* <1.5* 1.6*   CBC: Recent Labs  Lab 05/21/24 1125 05/22/24 0314 05/24/24 0946 05/25/24 0410  WBC 21.7* 22.7* 23.4* 24.2*  HGB 7.6* 7.4* 6.6* 9.2*  HCT 25.0* 23.8* 22.3* 29.1*  MCV 97.7 97.1 100.0 89.5  PLT 227 201 183 174   Medications:  feeding supplement (OSMOLITE 1.5 CAL) 45 mL/hr at 05/26/24 0436    amoxicillin -clavulanate  1 tablet Oral Q12H   apixaban   5 mg Oral BID   atorvastatin   40 mg Oral Daily   carvedilol   25 mg Oral BID WC   Chlorhexidine  Gluconate Cloth  6 each Topical Q0600   cloNIDine   0.3 mg Transdermal Weekly   darbepoetin (ARANESP ) injection - DIALYSIS  60 mcg Subcutaneous Q Fri-1800   diltiazem   360 mg Oral  Daily   feeding supplement  1 Container Oral BID BM   feeding supplement (PROSource TF20)  60 mL Per Tube Daily   ferrous sulfate   325 mg Oral Daily   fiber supplement (BANATROL TF)  60 mL Per Tube TID   Gerhardt's butt cream   Topical QID   insulin  aspart  0-15 Units Subcutaneous TID WC   lactose free nutrition  237 mL Oral Daily   losartan   50 mg Oral Daily   mirtazapine   7.5 mg Oral QHS   multivitamin  1 tablet Oral QHS   pantoprazole   40 mg Oral Daily   sodium chloride  flush  10-40 mL Intracatheter Q12H   thiamine   100 mg Oral Daily   vancomycin   125 mg Oral Daily    Dialysis Orders MWF [facility]   3.5hrs BFR 400 DFR 500 EDW 53kg 2K/2.5Ca Heparin  bolus 600 units with HD then 400 units/hr Calcitriol  1.43mcg with HD - last dose 9/3 Sensipar 60mg  with HD - last dose 9/3  Micera 75mcg Q 4 weeks - per records, dose just ordered Calcium  Acetate 667mh TID with meals   Assessment/Plan: Colo-uterine fistula: per abd CT. Per surgery/ pmd - appears surgery has signed off and plans outpt elective management; ID recommended augmentin  as well Cdif colitis: per ID on fidaxomicin  LUE edema: LUE duplex ordered by Hospitalist - no issue with LUE but acute non occlusive RIJ clot noted - started on eliquis .  No issues with AVG thus far. Monitor. ESRD: on HD MWF- for HD today. Mild hyperK this AM - ordered 1X Lokelma  to hold her over until HD. Will reach out to pharmacy to change TF to Nepro. HTN/volume: HTN on 5 BP agents, BP lower here - have stopped spironolactone . No edema on exam. Anemia of ESRD: Hgb much better today, continue Aranesp  q Friday. 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 Dementia Malnutrition: NG tube placed, TF started 9/10 Hyperkalemia: K 6 today, for 1 dose Lokelma  -> have asked RD to change TF for now. GOC: Palliative consulted -  ongoing loose stools, they are considering hospice but want to see how it goes over the next  few days - remains full code for now   Izetta Boehringer, PA-C 05/27/2024, 12:43 PM  [employer]

## 2024-05-28 DIAGNOSIS — Z515 Encounter for palliative care: Secondary | ICD-10-CM | POA: Diagnosis not present

## 2024-05-28 DIAGNOSIS — A0472 Enterocolitis due to Clostridium difficile, not specified as recurrent: Secondary | ICD-10-CM | POA: Diagnosis not present

## 2024-05-28 DIAGNOSIS — Z7189 Other specified counseling: Secondary | ICD-10-CM | POA: Diagnosis not present

## 2024-05-28 DIAGNOSIS — N824 Other female intestinal-genital tract fistulae: Secondary | ICD-10-CM | POA: Diagnosis not present

## 2024-05-28 LAB — GLUCOSE, CAPILLARY
Glucose-Capillary: 105 mg/dL — ABNORMAL HIGH (ref 70–99)
Glucose-Capillary: 106 mg/dL — ABNORMAL HIGH (ref 70–99)
Glucose-Capillary: 123 mg/dL — ABNORMAL HIGH (ref 70–99)
Glucose-Capillary: 141 mg/dL — ABNORMAL HIGH (ref 70–99)
Glucose-Capillary: 70 mg/dL (ref 70–99)
Glucose-Capillary: 81 mg/dL (ref 70–99)

## 2024-05-28 NOTE — Plan of Care (Signed)
  Problem: Nutrition: Goal: Adequate nutrition will be maintained Outcome: Progressing   Problem: Coping: Goal: Level of anxiety will decrease Outcome: Progressing   Problem: Pain Managment: Goal: General experience of comfort will improve and/or be controlled Outcome: Progressing   Problem: Safety: Goal: Ability to remain free from injury will improve Outcome: Progressing   Problem: Activity: Goal: Risk for activity intolerance will decrease Outcome: Not Progressing   Problem: Elimination: Goal: Will not experience complications related to bowel motility Outcome: Not Progressing

## 2024-05-28 NOTE — Progress Notes (Signed)
 Occupational Therapy Treatment Patient Details Name: Tracy Jennings MRN: [id number] DOB: 10-06-1955 Today's Date: 05/28/2024   History of present illness Patient is 68 yo female admitted on 05/16/24 for generalized weakness concerns for colouterine fistula. + C. Diff. PMH significant for dementia, HTN, DM, HLD, cervical cancer, recurrent pancreatitis, ESRD on HD.   OT comments  Pt with poor progression toward established OT goals this session. Pt needing up to max-total A +2 this session for bed mobility and STS transfers. Pt with pain in peri-area and decreased awareness of need for mobility with constant attempts to return to supine. Soiled in bed with cues for rolling and total A for pericare. Pt needing max encouragement for mobility this session. Will continue to follow.       If plan is discharge home, recommend the following:  A lot of help with walking and/or transfers;A lot of help with bathing/dressing/bathroom;Assistance with cooking/housework;Assistance with feeding;Direct supervision/assist for medications management;Direct supervision/assist for financial management;Assist for transportation;Help with stairs or ramp for entrance;Supervision due to cognitive status   Equipment Recommendations  Other (comment) (defer)    Recommendations for Other Services      Precautions / Restrictions Precautions Precautions: Fall Recall of Precautions/Restrictions: Impaired Restrictions Weight Bearing Restrictions Per Provider Order: No       Mobility Bed Mobility Overal bed mobility: Needs Assistance Bed Mobility: Supine to Sit, Sit to Supine, Rolling Rolling: Min assist   Supine to sit: Max assist, +2 for physical assistance, +2 for safety/equipment, HOB elevated, Used rails Sit to supine: Mod assist, HOB elevated, Used rails (for BLE)   General bed mobility comments: pt decline OOB    Transfers Overall transfer level: Needs assistance   Transfers: Sit to/from Stand Sit to  Stand: Total assist, +2 physical assistance, +2 safety/equipment           General transfer comment: bil knee block with poor knee and hip extension     Balance Overall balance assessment: Needs assistance Sitting-balance support: Feet supported Sitting balance-Leahy Scale: Poor Sitting balance - Comments: Pt leaning side to side to get off peri areea   Standing balance support: Bilateral upper extremity supported Standing balance-Leahy Scale: Poor Standing balance comment: unable to come up into a standing position                           ADL either performed or assessed with clinical judgement   ADL Overall ADL's : Needs assistance/impaired                                     Functional mobility during ADLs: Total assistance;+2 for physical assistance;+2 for safety/equipment General ADL Comments: pt with limited functional participation this session with constant attempts to return to supine EOB    Extremity/Trunk Assessment Upper Extremity Assessment Upper Extremity Assessment: Generalized weakness;LUE deficits/detail LUE Deficits / Details: edema in LUE( md was amde aware at last seesion) was repositioned in session/elevated as was rolled onto at the start of session   Lower Extremity Assessment Lower Extremity Assessment: Defer to PT evaluation        Vision   Additional Comments: R orbital area edema   Perception     Praxis     Communication Communication Communication: No apparent difficulties   Cognition Arousal: Lethargic Behavior During Therapy: Flat affect Cognition: History of cognitive impairments  OT - Cognition Comments: follows one step commands, needs cues to sustain tasks                 Following commands: Impaired Following commands impaired: Follows one step commands with increased time      Cueing   Cueing Techniques: Verbal cues, Gestural cues, Tactile cues, Visual cues  Exercises       Shoulder Instructions       General Comments      Pertinent Vitals/ Pain       Pain Assessment Pain Assessment: Faces Faces Pain Scale: Hurts whole lot Pain Location: peri area Pain Descriptors / Indicators: Tender, Discomfort Pain Intervention(s): Limited activity within patient's tolerance, Monitored during session  Home Living                                          Prior Functioning/Environment              Frequency  Min 2X/week        Progress Toward Goals  OT Goals(current goals can now be found in the care plan section)  Progress towards OT goals: Progressing toward goals  Acute Rehab OT Goals Patient Stated Goal: lay down OT Goal Formulation: With patient Time For Goal Achievement: 06/01/24 Potential to Achieve Goals: Fair ADL Goals Pt Will Perform Grooming: with supervision;sitting Pt Will Perform Upper Body Bathing: with supervision;sitting Pt Will Perform Lower Body Bathing: with min assist;sit to/from stand Pt Will Perform Upper Body Dressing: with supervision;sitting Pt Will Perform Lower Body Dressing: with min assist;sit to/from stand Pt Will Transfer to Toilet: with min assist;bedside commode  Plan      Co-evaluation    PT/OT/SLP Co-Evaluation/Treatment: Yes Reason for Co-Treatment: To address functional/ADL transfers PT goals addressed during session: Mobility/safety with mobility OT goals addressed during session: ADL's and self-care;Strengthening/ROM      AM-PAC OT 6 Clicks Daily Activity     Outcome Measure   Help from another person eating meals?: A Little Help from another person taking care of personal grooming?: A Lot Help from another person toileting, which includes using toliet, bedpan, or urinal?: Total Help from another person bathing (including washing, rinsing, drying)?: Total Help from another person to put on and taking off regular upper body clothing?: A Lot Help from another person to  put on and taking off regular lower body clothing?: Total 6 Click Score: 10    End of Session    OT Visit Diagnosis: Unsteadiness on feet (R26.81);Other abnormalities of gait and mobility (R26.89);Repeated falls (R29.6);Muscle weakness (generalized) (M62.81);Pain   Activity Tolerance Patient limited by fatigue;Patient limited by pain   Patient Left in bed;with call bell/phone within reach;with bed alarm set;with family/visitor present   Nurse Communication Mobility status        Time: 0950-1016 OT Time Calculation (min): 26 min  Charges: OT General Charges $OT Visit: 1 Visit OT Treatments $Therapeutic Activity: 8-22 mins  [name], OTD, OTR/L [facility] Office: [phone]   [name] 05/28/2024, 1:23 PM

## 2024-05-28 NOTE — Progress Notes (Signed)
 Physical Therapy Treatment Patient Details Name: Tracy Jennings MRN: [id number] DOB: Nov 19, 1955 Today's Date: 05/28/2024   History of Present Illness Patient is 68 yo female admitted on 05/16/24 for generalized weakness concerns for colouterine fistula. + C. Diff. PMH significant for dementia, HTN, DM, HLD, cervical cancer, recurrent pancreatitis, ESRD on HD.    PT Comments  Pt resting in bed on arrival, needing max encouragement for participation and education on importance of mobility with constant attempts to return to supine throughout. Pt seen in conjunction with OT to maximize pt activity tolerance and functional mobility progression. Pt requiring max-total A +2 for bed mobility and x2 transfers to stand with HHA x2. Pt continues to benefit from skilled PT services to progress toward functional mobility goals.     If plan is discharge home, recommend the following: A lot of help with walking and/or transfers;A lot of help with bathing/dressing/bathroom;Assist for transportation;Help with stairs or ramp for entrance;Supervision due to cognitive status;Assistance with cooking/housework;Direct supervision/assist for medications management;Direct supervision/assist for financial management   Can travel by private vehicle     No  Equipment Recommendations  None recommended by PT;Other (comment)    Recommendations for Other Services       Precautions / Restrictions Precautions Precautions: Fall Recall of Precautions/Restrictions: Impaired Restrictions Weight Bearing Restrictions Per Provider Order: No     Mobility  Bed Mobility Overal bed mobility: Needs Assistance Bed Mobility: Supine to Sit, Sit to Supine, Rolling Rolling: Min assist   Supine to sit: Max assist, +2 for physical assistance, +2 for safety/equipment, HOB elevated, Used rails Sit to supine: Mod assist, HOB elevated, Used rails (for BLE)   General bed mobility comments: pt decline OOB    Transfers Overall  transfer level: Needs assistance Equipment used: 2 person hand held assist Transfers: Sit to/from Stand Sit to Stand: Total assist, +2 physical assistance, +2 safety/equipment           General transfer comment: bil knee block with poor knee and hip extension    Ambulation/Gait                   Stairs             Wheelchair Mobility     Tilt Bed    Modified Rankin (Stroke Patients Only)       Balance Overall balance assessment: Needs assistance Sitting-balance support: Feet supported Sitting balance-Leahy Scale: Poor Sitting balance - Comments: Pt leaning side to side to get off peri areea   Standing balance support: Bilateral upper extremity supported Standing balance-Leahy Scale: Poor Standing balance comment: unable to come up into a standing position                            Communication Communication Communication: No apparent difficulties  Cognition Arousal: Lethargic Behavior During Therapy: Flat affect                             Following commands: Impaired Following commands impaired: Follows one step commands with increased time    Cueing Cueing Techniques: Verbal cues, Gestural cues, Tactile cues, Visual cues  Exercises Other Exercises Other Exercises: quad sets x5 ea side, attempted SLR with pt declining    General Comments General comments (skin integrity, edema, etc.): pt family present and supportive throughout session      Pertinent Vitals/Pain Pain Assessment Pain Assessment: Faces Faces Pain Scale:  Hurts whole lot Pain Location: peri area Pain Descriptors / Indicators: Tender, Discomfort Pain Intervention(s): Monitored during session, Limited activity within patient's tolerance    Home Living                          Prior Function            PT Goals (current goals can now be found in the care plan section) Acute Rehab PT Goals PT Goal Formulation: With patient/family Time  For Goal Achievement: 06/01/24 Progress towards PT goals: Not progressing toward goals - comment (self limiting)    Frequency    Min 2X/week      PT Plan      Co-evaluation PT/OT/SLP Co-Evaluation/Treatment: Yes Reason for Co-Treatment: To address functional/ADL transfers PT goals addressed during session: Mobility/safety with mobility OT goals addressed during session: ADL's and self-care;Strengthening/ROM      AM-PAC PT 6 Clicks Mobility   Outcome Measure  Help needed turning from your back to your side while in a flat bed without using bedrails?: A Little Help needed moving from lying on your back to sitting on the side of a flat bed without using bedrails?: A Lot Help needed moving to and from a bed to a chair (including a wheelchair)?: Total Help needed standing up from a chair using your arms (e.g., wheelchair or bedside chair)?: Total Help needed to walk in hospital room?: Total Help needed climbing 3-5 steps with a railing? : Total 6 Click Score: 9    End of Session   Activity Tolerance: Patient limited by fatigue (self limiting) Patient left: in bed;with call bell/phone within reach;with bed alarm set Nurse Communication: Mobility status PT Visit Diagnosis: Muscle weakness (generalized) (M62.81);Unsteadiness on feet (R26.81)     Time: 9049-8983 PT Time Calculation (min) (ACUTE ONLY): 26 min  Charges:    $Therapeutic Activity: 8-22 mins PT General Charges $$ ACUTE PT VISIT: 1 Visit                     Yajaira Doffing R. PTA Acute Rehabilitation Services Office: [phone]   [name] 05/28/2024, 4:27 PM

## 2024-05-28 NOTE — Progress Notes (Signed)
 This patient has continuous bowel movements skin is raw. Washed and cleaned her all through the night and used the cream that is prescribed. She is in quite a bit of pain from the breakdown. She did, however, eat a can of vegetable soup with crackers...was fed to her. And drank a cup of juice and water . (Ate about two bites of potatoes and chicken, but requested soup. )

## 2024-05-28 NOTE — Progress Notes (Signed)
 [facility] Progress Note   Subjective:  Seen in room. Pt hard to wake and barely answering my questions, does endorse buttocks pain. RN reports ate a few bites of food this AM. Palliative discussions ongoing. Did poorly with TF, exacerbated diarrhea and sacral pain. NG tube remains in place, however, to give meds. Explained to family that although she technically tolerates dialysis (did ok yesterday with 2L off), not sure it is helping her QOL. They are hopeful that she will start to eat better and improve.  Objective Vitals:   05/27/24 2111 05/28/24 0431 05/28/24 0830 05/28/24 0915  BP: 124/60 (!) 140/53 (!) 158/67 (!) 158/67  Pulse: 78 79 73   Resp: 18 16 18    Temp: 98 F (36.7 C) 98.1 F (36.7 C) 97.8 F (36.6 C)   TempSrc: Oral Oral Oral   SpO2: 98% 97% 100%   Weight:      Height:       Physical Exam General: Frail woman, curled on side, NG tube in place Heart: RRR Lungs: CTAB Extremities: no LE edema Dialysis Access: L AVG  Additional Objective Labs: Basic Metabolic Panel: Recent Labs  Lab 05/24/24 0946 05/25/24 0410 05/26/24 0457 05/27/24 0221  NA 134* 134*  --  130*  K 3.8 3.9 5.8* 6.0*  CL 98 96*  --  91*  CO2 27 28  --  28  GLUCOSE 270* 252*  --  154*  BUN 33* 26*  --  54*  CREATININE 3.44* 2.43*  --  3.55*  CALCIUM  7.6* 7.4*  --  7.4*  PHOS 1.3* <1.0* 2.5  --    Liver Function Tests: Recent Labs  Lab 05/22/24 0314 05/24/24 0946 05/25/24 0410  AST  --   --  15  ALT  --   --  8  ALKPHOS  --   --  68  BILITOT  --   --  0.6  PROT  --   --  5.4*  ALBUMIN <1.5* <1.5* 1.6*   CBC: Recent Labs  Lab 05/22/24 0314 05/24/24 0946 05/25/24 0410  WBC 22.7* 23.4* 24.2*  HGB 7.4* 6.6* 9.2*  HCT 23.8* 22.3* 29.1*  MCV 97.1 100.0 89.5  PLT 201 183 174   Medications:   amoxicillin -clavulanate  1 tablet Oral Q12H   apixaban   5 mg Oral BID   atorvastatin   40 mg Oral Daily   carvedilol   25 mg Oral BID WC   Chlorhexidine  Gluconate Cloth   6 each Topical Q0600   cloNIDine   0.3 mg Transdermal Weekly   darbepoetin (ARANESP ) injection - DIALYSIS  60 mcg Subcutaneous Q Fri-1800   diltiazem   360 mg Oral Daily   feeding supplement  1 Container Oral BID BM   ferrous sulfate   325 mg Oral Daily   Gerhardt's butt cream   Topical QID   insulin  aspart  0-15 Units Subcutaneous TID WC   lactose free nutrition  237 mL Oral Daily   losartan   50 mg Oral Daily   mirtazapine   7.5 mg Oral QHS   multivitamin  1 tablet Oral QHS   pantoprazole   40 mg Oral Daily   sodium chloride  flush  10-40 mL Intracatheter Q12H   thiamine   100 mg Oral Daily   vancomycin   125 mg Oral Daily    Dialysis Orders MWF [facility]  3.5hrs, 400/500, EDW 53kg, 2K/2.5Ca bath, AVG, heparin  6000 unit bolus + 400u/hr pump - Calcitriol  1.36mcg with HD - Sensipar 60mg  with HD - Micera 75mcg  Q 4 weeks - per records, dose just ordered (not given) - Binder: Calcium  Acetate 667mh TID with meals   Assessment/Plan: Colo-uterine fistula: per abd CT. Appears surgery has signed off and plans outpt elective management; ID recommended Augmentin . C-diff colitis: On PO Vancomycin , WBC remains high. LUE edema: LUE duplex ordered by Hospitalist - no issue with LUE but acute non occlusive RIJ clot noted - started on eliquis .  No issues with AVG thus far. Monitor. ESRD: on HD MWF - next HD tomorrow. HTN/volume: HTN on 5 BP agents, BP lower here - have stopped spironolactone . No edema on exam. Anemia of ESRD: Hgb 9.2, continue Aranesp  q Friday. 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 Dementia Malnutrition: NG tube placed, TF started 9/10 - stopped 9/14 (exacerbated diarrhea) Hyperkalemia: S/p 1 dose Lokelma  followed by HD on 9/15 - await repeat. GOC: Palliative consulted -  ongoing loose stools, they are considering hospice but want to see how it goes over the next few days to see if can start eating better - remains full code for  now   Izetta Boehringer, PA-C 05/28/2024, 1:20 PM  [employer]

## 2024-05-28 NOTE — Plan of Care (Signed)
  Problem: Nutrition: Goal: Adequate nutrition will be maintained Outcome: Progressing   Problem: Education: Goal: Knowledge of General Education information will improve Description: Including pain rating scale, medication(s)/side effects and non-pharmacologic comfort measures Outcome: Not Progressing   Problem: Health Behavior/Discharge Planning: Goal: Ability to manage health-related needs will improve Outcome: Not Progressing   Problem: Clinical Measurements: Goal: Will remain free from infection Outcome: Not Progressing   Problem: Activity: Goal: Risk for activity intolerance will decrease Outcome: Not Progressing   Problem: Elimination: Goal: Will not experience complications related to bowel motility Outcome: Not Progressing Goal: Will not experience complications related to urinary retention Outcome: Not Progressing   Problem: Pain Managment: Goal: General experience of comfort will improve and/or be controlled Outcome: Not Progressing   Problem: Skin Integrity: Goal: Risk for impaired skin integrity will decrease Outcome: Not Progressing

## 2024-05-28 NOTE — Inpatient Diabetes Management (Signed)
 Inpatient Diabetes Program Recommendations  AACE/ADA: New Consensus Statement on Inpatient Glycemic Control (2015)  Target Ranges:  Prepandial:   less than 140 mg/dL      Peak postprandial:   less than 180 mg/dL (1-2 hours)      Critically ill patients:  140 - 180 mg/dL   Lab Results  Component Value Date   GLUCAP 123 (H) 05/28/2024   HGBA1C 5.4 05/27/2024    Review of Glycemic Control  Latest Reference Range & Units 05/27/24 04:18 05/27/24 08:04 05/27/24 12:02 05/27/24 21:08 05/28/24 00:41 05/28/24 08:27  Glucose-Capillary 70 - 99 mg/dL 875 (H) 886 (H) 846 (H) 67 (L) 141 (H) 123 (H)  (H): Data is abnormally high (L): Data is abnormally low Diabetes history: Type 2 DM Outpatient Diabetes medications: none Current orders for Inpatient glycemic control: Novolog  0-15 units Q4H  Inpatient Diabetes Program Recommendations:    Noted hypoglycemia following correction. Consider reducing Novolog  0-6 units Q4H.   Thanks, Tinnie Minus, MSN, RNC-OB Diabetes Coordinator [phone] (8a-5p)

## 2024-05-28 NOTE — Progress Notes (Addendum)
 [facility]  PROGRESS NOTE  Tracy Jennings FMW:[id number] DOB: 07-Jul-1956 DOA: 05/16/2024 PCP: Zachary Idelia LABOR, MD   Brief HPI:   68 year old F with PMH of cognitive impairment, ESRD on HD MWF, cervical cancer, HTN, HLD, anemia and recent hospitalizations from 6/24-7/22 for hypertensive urgency and delirium, and 8/17-8/24 for C. difficile colitis for which she was treated with p.o. vancomycin  brought back to [facility] from PCP office due to weakness, nausea, diarrhea and abdominal discomfort.   ID consulted for persistent leukocytosis and started Augmentin  and ordered CT chest that showed small slightly loculated appearing left pleural effusion, subpleural rounded consolidation at lingula, ovoid masslike consolidation at the anterior left lung base contagious with curvilinear consolidation posteriorly suggesting atelectasis, patchy areas of mild bilateral ground glass density and anasarca.     Assessment/Plan:   C. difficile colitis: Completed 10 days of oral vancomycin  about a week ago.  Ongoing abdominal pain and diarrhea.  CT suggested rectosigmoid colitis.  She has loose stool but not frequently.  Abdominal exam benign.  Still with significant leukocytosis - Completed Dificid  on 05/26/2024  As per ID recommendation  -Continue Augmentin  to complete 10 days on antibiotics for possible pneumonia with possible or loculation EOT 9/17.  Patient reports minimal respiratory symptoms - Complete Dificid  x 10 days then vancomycin  p.o. daily 7 days out form completing Augmentin . - Continue both Augmentin  and p.o. vancomycin   Colouterine fistula-reason for transfer from [facility] to [facility].  Abdominal exam benign. - General Surgery recommends elective management and treating C. difficile. -Tube feed was initiated, patient continues to have persistent liquid stools from the colon uterine fistula passing through the vagina. -Patient is in severe pain and discomfort, discussed with family, will hold tube  feeding at this time.  Patient is eating better, will let her eat by mouth.   Persistent leukocytosis/bandemia: Persisted despite treatment of C. difficile.  Has no respiratory symptoms but chronic left pleural effusion which was felt to be small to tap.  Left forearm swelling but no signs of infection.  Blood culture negative on 9/4. Incidental right IJ acute DVT.  CT showed small slightly loculated appearing left pleural effusion and masslike consolidations as above. -Appreciate help by ID-started p.o. Augmentin  -Encourage incentive telemetry -Needs repeat CT in 4 to 6 weeks for LLL mass.   Loculated left pleural effusion/masslike consolidation: Seems chronic.  Has no respiratory symptoms.  IR consulted for thoracocentesis but pleural effusion felt to be small to tap safely. CT chest finding as above.   - Now on p.o. Augmentin  per ID - Encourage incentive telemetry - Needs repeat CT in 4 to 6 weeks  Diabetes mellitus -Glucose went up to 340s, started on sliding scale insulin  NovoLog  -Will hold Lantus  due to persistent diarrhea and stopping tube feeds -Continue sliding scale insulin  NovoLog  -Check hemoglobin A1c   Dementia without behavioral disturbance: Oriented to self and family. Failure to thrive/poor appetite/unintentional weight loss: family reports significant weight loss since her hospitalization in July 2025.  Not able to quantify.  Appetite remains poor even after addition of low-dose Remeron .  Dietitian recommends tube feed.  Tube feed was started.  However patient has liquid stools.  Will hold tube feeding at this time as above. -Continue low-dose Remeron  -Appreciate input by dietitian-recommends cortrack and tube feed. -Appreciate input by palliative-full code with full scope of care per family   Uncontrolled hypertension: Improved  -Continue home clonidine  patch, losartan , Coreg , hydralazine  and Aldactone  -Cardizem  increased from 180 mg daily to 360 mg daily  on  9/5 -Ultrafiltration per nephrology. -Continue monitoring  Hyperkalemia -Potassium is 6.0 -Status post 1 dose of Lokelma  Nephrology following -   Acute right IJ DVT: Incidental finding.  Venous Doppler was ordered for left forearm swelling but showed acute right IJ DVT. - Started on Eliquis    ESRD on HD MWF-last HD on Wednesday.  No emergent need. - Per nephrology.   Anemia of renal disease: H&H relatively stable.  No overt bleeding such as melena or hematochezia. - Hemoglobin dropped to 6.6, status post 1 unit PRBC  - Hemoglobin up to 9.2 today  - Follow CBC in a.m.    Physical deconditioning - Therapy recommended SNF.  Seems family is in agreement now   History of uterine cancer: - Outpatient follow-up.   Hyperkalemia -Potassium is 5.8 today -Lokelma  ordered per nephrology   Severe malnutrition/failure to thrive/hypoalbuminemia Body mass index is 22.77 kg/m. Nutrition Problem: Severe Malnutrition Etiology: chronic illness Signs/Symptoms: severe muscle depletion, severe fat depletion, meal completion < 25%, per patient/family report, energy intake < or equal to 75% for > or equal to 1 month Interventions: Refer to RD note for recommendations  Goals of care Palliative care consulted for goals of care.  Patient has poor quality of life due to ongoing liquid stools from the [facility] uterine fistula.  Palliative care is following for discussions of goals of care.  Tube feeding has been stopped due to ongoing diarrhea.  Started on p.o. diet.  She will be a poor candidate for PEG tube placement.  Not sure if she will be a good surgical candidate for repair of Colo uterine fistula.  Palliative care following for ongoing discussion with family  Medications     amoxicillin -clavulanate  1 tablet Oral Q12H   apixaban   5 mg Oral BID   atorvastatin   40 mg Oral Daily   carvedilol   25 mg Oral BID WC   Chlorhexidine  Gluconate Cloth  6 each Topical Q0600   cloNIDine   0.3 mg Transdermal  Weekly   darbepoetin (ARANESP ) injection - DIALYSIS  60 mcg Subcutaneous Q Fri-1800   diltiazem   360 mg Oral Daily   feeding supplement  1 Container Oral BID BM   ferrous sulfate   325 mg Oral Daily   Gerhardt's butt cream   Topical QID   insulin  aspart  0-15 Units Subcutaneous TID WC   lactose free nutrition  237 mL Oral Daily   losartan   50 mg Oral Daily   mirtazapine   7.5 mg Oral QHS   multivitamin  1 tablet Oral QHS   pantoprazole   40 mg Oral Daily   sodium chloride  flush  10-40 mL Intracatheter Q12H   thiamine   100 mg Oral Daily   vancomycin   125 mg Oral Daily     Data Reviewed:   CBG:  Recent Labs  Lab 05/27/24 2108 05/28/24 0041 05/28/24 0827 05/28/24 1407 05/28/24 1606  GLUCAP 67* 141* 123* 81 70    SpO2: 100 %    Vitals:   05/28/24 0431 05/28/24 0830 05/28/24 0915 05/28/24 1611  BP: (!) 140/53 (!) 158/67 (!) 158/67 (!) 93/48  Pulse: 79 73  64  Resp: 16 18  17   Temp: 98.1 F (36.7 C) 97.8 F (36.6 C)  (!) 97.5 F (36.4 C)  TempSrc: Oral Oral  Axillary  SpO2: 97% 100%  100%  Weight:      Height:          Data Reviewed:  Basic Metabolic Panel: Recent Labs  Lab 05/22/24 0314  05/23/24 0346 05/24/24 0403 05/24/24 0946 05/25/24 0410 05/26/24 0457 05/27/24 0221  NA 134* 137  --  134* 134*  --  130*  K 3.2* 3.4* 3.7 3.8 3.9 5.8* 6.0*  CL 98 97*  --  98 96*  --  91*  CO2 27 27  --  27 28  --  28  GLUCOSE 122* 225*  --  270* 252*  --  154*  BUN 24* 16  --  33* 26*  --  54*  CREATININE 3.77* 2.64*  --  3.44* 2.43*  --  3.55*  CALCIUM  7.6* 7.5*  --  7.6* 7.4*  --  7.4*  MG 1.7  --  1.7  --  1.6* 2.1  --   PHOS 2.9  --  1.3* 1.3* <1.0* 2.5  --     CBC: Recent Labs  Lab 05/22/24 0314 05/24/24 0946 05/25/24 0410  WBC 22.7* 23.4* 24.2*  HGB 7.4* 6.6* 9.2*  HCT 23.8* 22.3* 29.1*  MCV 97.1 100.0 89.5  PLT 201 183 174    LFT Recent Labs  Lab 05/22/24 0314 05/24/24 0946 05/25/24 0410  AST  --   --  15  ALT  --   --  8  ALKPHOS  --    --  68  BILITOT  --   --  0.6  PROT  --   --  5.4*  ALBUMIN <1.5* <1.5* 1.6*     Antibiotics: Anti-infectives (From admission, onward)    Start     Dose/Rate Route Frequency Ordered Stop   05/27/24 1000  vancomycin  (VANCOCIN ) capsule 125 mg        125 mg Oral Daily 05/21/24 1019 06/04/24 0959   05/20/24 1530  amoxicillin -clavulanate (AUGMENTIN ) 500-125 MG per tablet 1 tablet        1 tablet Oral Every 12 hours 05/20/24 1519 05/30/24 0959   05/17/24 0100  fidaxomicin  (DIFICID ) tablet 200 mg        200 mg Oral 2 times daily 05/17/24 0003 05/26/24 1037   05/17/24 0100  metroNIDAZOLE  (FLAGYL ) IVPB 500 mg  Status:  Discontinued        500 mg 100 mL/hr over 60 Minutes Intravenous Every 12 hours 05/17/24 0003 05/20/24 1519        DVT prophylaxis: Apixaban   Code Status: Full code  Family Communication: Discussed with patient's husband at bedside   CONSULTS nephrology   Subjective   Patient continues to be lethargic, has better p.o. intake today.  Tube feeds currently on hold.  Diarrhea improving   Objective    Physical Examination:  Appears lethargic, opens eyes to verbal stimuli S1-S2, regular Lungs clear to auscultation bilaterally Abdomen is soft, nontender, no organomegaly  Status is: Inpatient:             Sabas GORMAN Brod   [facility] If 7PM-7AM, please contact night-coverage at [email or web address], Office  [phone]   05/28/2024, 4:40 PM  LOS: 12 days

## 2024-05-29 DIAGNOSIS — Z515 Encounter for palliative care: Secondary | ICD-10-CM | POA: Diagnosis not present

## 2024-05-29 DIAGNOSIS — N824 Other female intestinal-genital tract fistulae: Secondary | ICD-10-CM | POA: Diagnosis not present

## 2024-05-29 DIAGNOSIS — Z7189 Other specified counseling: Secondary | ICD-10-CM | POA: Diagnosis not present

## 2024-05-29 DIAGNOSIS — C530 Malignant neoplasm of endocervix: Secondary | ICD-10-CM

## 2024-05-29 DIAGNOSIS — A0472 Enterocolitis due to Clostridium difficile, not specified as recurrent: Secondary | ICD-10-CM | POA: Diagnosis not present

## 2024-05-29 LAB — GLUCOSE, CAPILLARY
Glucose-Capillary: 78 mg/dL (ref 70–99)
Glucose-Capillary: 98 mg/dL (ref 70–99)
Glucose-Capillary: 140 mg/dL — ABNORMAL HIGH (ref 70–99)

## 2024-05-29 LAB — RENAL FUNCTION PANEL
Albumin: <1.5 g/dL — ABNORMAL LOW (ref 3.5–5.0)
Anion gap: 13 (ref 5–15)
BUN: 45 mg/dL — ABNORMAL HIGH (ref 8–23)
CO2: 29 mmol/L (ref 22–32)
Calcium: 8 mg/dL — ABNORMAL LOW (ref 8.9–10.3)
Chloride: 91 mmol/L — ABNORMAL LOW (ref 98–111)
Creatinine, Ser: 3.79 mg/dL — ABNORMAL HIGH (ref 0.44–1.00)
GFR, Estimated: 13 mL/min — ABNORMAL LOW (ref >60)
Glucose, Bld: 97 mg/dL (ref 70–99)
Phosphorus: 3.8 mg/dL (ref 2.5–4.6)
Potassium: 5.4 mmol/L — ABNORMAL HIGH (ref 3.5–5.1)
Sodium: 133 mmol/L — ABNORMAL LOW (ref 135–145)

## 2024-05-29 LAB — CBC
HCT: 27.5 % — ABNORMAL LOW (ref 36.0–46.0)
Hemoglobin: 8.4 g/dL — ABNORMAL LOW (ref 12.0–15.0)
MCH: 27.9 pg (ref 26.0–34.0)
MCHC: 30.5 g/dL (ref 30.0–36.0)
MCV: 91.4 fL (ref 80.0–100.0)
Platelets: 212 K/uL (ref 150–400)
RBC: 3.01 MIL/uL — ABNORMAL LOW (ref 3.87–5.11)
RDW: 21.5 % — ABNORMAL HIGH (ref 11.5–15.5)
WBC: 14.6 K/uL — ABNORMAL HIGH (ref 4.0–10.5)
nRBC: 0 % (ref 0.0–0.2)

## 2024-05-29 MED ORDER — HYDRALAZINE HCL 20 MG/ML IJ SOLN
10.0000 mg | INTRAMUSCULAR | Status: DC | PRN
  Administered 2024-06-03 – 2024-06-14 (×4): 10 mg via INTRAVENOUS
  Filled 2024-05-29 (×4): qty 1

## 2024-05-29 MED ORDER — FAMOTIDINE 20 MG PO TABS
20.0000 mg | ORAL_TABLET | Freq: Every day | ORAL | Status: DC
  Administered 2024-05-30 – 2024-06-14 (×15): 20 mg via ORAL
  Filled 2024-05-29 (×15): qty 1

## 2024-05-29 MED ORDER — PROSOURCE PLUS PO LIQD
30.0000 mL | Freq: Two times a day (BID) | ORAL | Status: DC
  Administered 2024-05-30 – 2024-06-25 (×37): 30 mL via ORAL
  Filled 2024-05-29 (×36): qty 30

## 2024-05-29 MED ORDER — BOOST PLUS PO LIQD
237.0000 mL | Freq: Three times a day (TID) | ORAL | Status: DC
  Administered 2024-05-30 – 2024-06-12 (×28): 237 mL via ORAL
  Filled 2024-05-29 (×51): qty 237

## 2024-05-29 MED ORDER — OXYCODONE HCL 5 MG PO TABS
5.0000 mg | ORAL_TABLET | ORAL | Status: DC | PRN
  Administered 2024-05-29 – 2024-05-30 (×2): 5 mg via ORAL
  Filled 2024-05-29 (×2): qty 1

## 2024-05-29 NOTE — Plan of Care (Signed)
  Problem: Nutrition: Goal: Adequate nutrition will be maintained Outcome: Progressing   Problem: Coping: Goal: Level of anxiety will decrease Outcome: Progressing   Problem: Safety: Goal: Ability to remain free from injury will improve Outcome: Progressing   Problem: Activity: Goal: Risk for activity intolerance will decrease Outcome: Not Progressing

## 2024-05-29 NOTE — Progress Notes (Signed)
 Received patient in bed.Alert and oriented to person and place.Consent verified.  Access used: Left avg that worked well.  Duration of treatment : 3 hours.  Uf goal: 1.5 L  Hand off to the patient's nurse,with stable condition via transporeter.

## 2024-05-29 NOTE — Progress Notes (Signed)
 [facility] Progress Note Patient: Tracy Jennings FMW:[id number] DOB: 22-May-1956  DOA: 05/16/2024 DOS: the patient was seen and examined on 05/29/2024  Brief Hospital Course: 68 year old F with PMH of cognitive impairment, ESRD on HD MWF, cervical cancer, HTN, HLD, anemia and recent hospitalizations from 6/24-7/22 for hypertensive urgency and delirium, and 8/17-8/24 for C. difficile colitis for which she was treated with p.o. vancomycin  brought back to [facility] from PCP office due to weakness, nausea, diarrhea and abdominal discomfort.   ID consulted for persistent leukocytosis and started Augmentin  and ordered CT chest that showed small slightly loculated appearing left pleural effusion, subpleural rounded consolidation at lingula, ovoid masslike consolidation at the anterior left lung base contagious with curvilinear consolidation posteriorly suggesting atelectasis, patchy areas of mild bilateral ground glass density and anasarca.  Assessment and Plan: C. difficile colitis:  ID was consulted. Was initially on oral vancomycin . Later on was on Dificid  completed for 10 days. Now back on oral vancomycin  for 7 more days. Continues to have ongoing diarrhea. May need Questran.   Colouterine fistula- reason for transfer from [facility] to [facility]. Had significant abdominal pain.  Currently pain resolved. General surgery was consulted and recommend conservative management of treatment of C. difficile as well as outpatient follow-up. No indication for inpatient surgery per surgery. Tube feedings discontinued.  Cortrak removed due to ongoing diarrhea.   Persistent leukocytosis/bandemia:  Improving. Will monitor.   Loculated left pleural effusion/masslike consolidation:  Seems chronic.  Has no respiratory symptoms.  IR consulted for thoracocentesis but pleural effusion felt to be small to tap safely.  Treated with Augmentin . Repeat chest x-ray in 4 to 6 weeks.  Diabetes mellitus Monitor CBG. Currently  on sliding scale insulin .   Dementia without behavioral disturbance: Oriented to self and family.  Failure to thrive/poor appetite/unintentional weight loss: Significant weight loss. Hopefully oral intake will improve. Will monitor.   Uncontrolled hypertension: Improved  -Continue home clonidine  patch, losartan , Coreg , hydralazine  and Aldactone  -Cardizem  increased from 180 mg daily to 360 mg daily on 9/5 -Ultrafiltration per nephrology. -Continue monitoring   Hyperkalemia Monitor.   Acute right IJ DVT:  Incidental finding.  Venous Doppler was ordered for left forearm swelling but showed acute right IJ DVT. - Started on Eliquis    ESRD on HD MWF-last HD on Wednesday.  No emergent need. - Per nephrology.   Anemia of renal disease:  Hemoglobin drifting low. Will monitor.   Physical deconditioning - Therapy recommended SNF.  Seems family is in agreement now   History of uterine cancer: - Outpatient follow-up.   Severe malnutrition/failure to thrive/hypoalbuminemia Will add Nepro.  Goals of care Palliative care consulted for goals of care.  Patient has poor quality of life due to ongoing liquid stools from the [facility] uterine fistula.  Palliative care is following for discussions of goals of care.  Tube feeding has been stopped due to ongoing diarrhea.  Started on p.o. diet.  She will be a poor candidate for PEG tube placement.  Not sure if she will be a good surgical candidate for repair of Colo uterine fistula.  Palliative care following for ongoing discussion with family   Subjective: Abdominal pain improving.  No nausea no vomiting no fever no chills.  Oral intake improving.  Physical Exam: Clear to auscultation. S1-S2 present Bowel sound present. Nontender. No edema.  Data Reviewed: I have Reviewed nursing notes, Vitals, and Lab results. Since last encounter, pertinent lab results CBC and BMP   . I have ordered test including CBC  and BMP  .  Discussed with palliative  care Disposition: Status is: Inpatient Remains inpatient appropriate because: Monitor for improvement in oral intake  Place and maintain sequential compression device Start: 05/17/24 1416 SCDs Start: 05/17/24 0000 apixaban  (ELIQUIS ) tablet 5 mg    Family Communication: Family at bedside Level of care: Telemetry Medical   Vitals:   05/29/24 1130 05/29/24 1215 05/29/24 1321 05/29/24 1831  BP: (!) 119/49 (!) 167/51 (!) 134/55 (!) 138/58  Pulse:  70 75 79  Resp: 12 12 16 16   Temp:  98.1 F (36.7 C) 98.2 F (36.8 C) 98.5 F (36.9 C)  TempSrc:  Oral Oral Oral  SpO2: 100% 95% 96% 95%  Weight:      Height:         Author: Yetta Blanch, MD 05/29/2024 7:26 PM  Please look on [email or web address] to find out who is on call.

## 2024-05-29 NOTE — Progress Notes (Signed)
 NG tube has been removed per order to discontinue. Patient tolerated well. Bed in lowest position and call light within reach.

## 2024-05-29 NOTE — Hospital Course (Addendum)
 68 y.o. 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   The initial plan after general surgery evaluation was discharged with outpatient follow-up for surgical resection of fistula, but it became clear that the patient's functional status had declined so much that she was too weak to sit up in a chair for dialysis, and her hospitalization was prolonged.   She subsequently then developed bleeding from her colo-uterine fistula, and GI were consulted.   On flexible sigmoidoscopy, she was found to have a large malignant appearing mass at the site of the fistula   Post-sigmoidoscopy developed heavy bleeding and was taken for embolization.   Post-embolization, biopsy inconclusive, but patient's functional status continues to decline.idoscopy requiring embolization.

## 2024-05-29 NOTE — Progress Notes (Signed)
  [facility] Progress Note   Subjective:  Seen on HD - 1.5L UFG and tolerating. Speaking a little today your hands are cold, don't touch me. C/o pain all over.    Objective Vitals:   05/29/24 0902 05/29/24 0918 05/29/24 0930 05/29/24 1000  BP: (!) 158/50  (!) 144/56 (!) 125/54  Pulse: 65  65 66  Resp: (!) 4  (!) 9 10  Temp:      TempSrc:      SpO2: 94%  98% 100%  Weight:  59 kg    Height:       Physical Exam General: Frail woman, NAD. NG tube in place, + R supraorbital edema Heart: RRR Lungs: CTAB Extremities: Trace LE edema Dialysis Access: L AVG  Additional Objective Labs: Basic Metabolic Panel: Recent Labs  Lab 05/25/24 0410 05/26/24 0457 05/27/24 0221 05/29/24 0813  NA 134*  --  130* 133*  K 3.9 5.8* 6.0* 5.4*  CL 96*  --  91* 91*  CO2 28  --  28 29  GLUCOSE 252*  --  154* 97  BUN 26*  --  54* 45*  CREATININE 2.43*  --  3.55* 3.79*  CALCIUM  7.4*  --  7.4* 8.0*  PHOS <1.0* 2.5  --  3.8   Liver Function Tests: Recent Labs  Lab 05/24/24 0946 05/25/24 0410 05/29/24 0813  AST  --  15  --   ALT  --  8  --   ALKPHOS  --  68  --   BILITOT  --  0.6  --   PROT  --  5.4*  --   ALBUMIN <1.5* 1.6* <1.5*   CBC: Recent Labs  Lab 05/24/24 0946 05/25/24 0410 05/29/24 0813  WBC 23.4* 24.2* 14.6*  HGB 6.6* 9.2* 8.4*  HCT 22.3* 29.1* 27.5*  MCV 100.0 89.5 91.4  PLT 183 174 212   Medications:   amoxicillin -clavulanate  1 tablet Oral Q12H   apixaban   5 mg Oral BID   atorvastatin   40 mg Oral Daily   carvedilol   25 mg Oral BID WC   Chlorhexidine  Gluconate Cloth  6 each Topical Q0600   cloNIDine   0.3 mg Transdermal Weekly   darbepoetin (ARANESP ) injection - DIALYSIS  60 mcg Subcutaneous Q Fri-1800   diltiazem   360 mg Oral Daily   feeding supplement  1 Container Oral BID BM   ferrous sulfate   325 mg Oral Daily   Gerhardt's butt cream   Topical QID   insulin  aspart  0-15 Units Subcutaneous TID WC   lactose free nutrition  237 mL Oral  Daily   losartan   50 mg Oral Daily   mirtazapine   7.5 mg Oral QHS   multivitamin  1 tablet Oral QHS   pantoprazole   40 mg Oral Daily   sodium chloride  flush  10-40 mL Intracatheter Q12H   thiamine   100 mg Oral Daily   vancomycin   125 mg Oral Daily    Dialysis Orders MWF [facility]  3.5hrs, 400/500, EDW 53kg, 2K/2.5Ca bath, AVG, heparin  6000 unit bolus + 400u/hr pump - Calcitriol  1.35mcg with HD - Sensipar 60mg  with HD - Micera 75mcg Q 4 weeks - per records, dose just ordered (not given) - Binder: Calcium  Acetate 667mh TID with meals   Assessment/Plan: Colo-uterine fistula: per abd CT. Appears surgery has signed off and plans outpt elective management; ID recommended Augmentin . C-diff colitis: On PO Vancomycin , WBC remains high but improving. LUE edema: LUE duplex ordered by Hospitalist - no issue  with LUE but acute non occlusive RIJ clot noted - started on eliquis .  No issues with AVG thus far. Monitor. ESRD: on HD MWF - HD now, 1.5L UFG. HTN/volume: HTN on 5 BP agents, BP lower here - have stopped spironolactone . Min edema on exam. Anemia of ESRD: Hgb 8.4, continue Aranesp  q Friday. 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 Dementia Malnutrition: NG tube placed, TF started 9/10 - stopped 9/14 (exacerbated diarrhea) Hyperkalemia: S/p 1 dose Lokelma  followed by HD on 9/15 - await repeat. GOC: Palliative consulted -  ongoing loose stools, they are considering hospice but want to see how it goes over the next few days to see if can start eating better - remains full code for now. Technically tolerates HD, but doubt it is contributing to QOL.   Izetta Boehringer, PA-C 05/29/2024, 10:12 AM  [employer]

## 2024-05-29 NOTE — Progress Notes (Signed)
 Patient arrived to 6N15 from HD with a 0/10 pain, no concerns voiced at this time. Vitals within range, patient is currently laying in a up right position in bed eating with the assistance of a family member. All needs met at this time, bed in lowest position and call light within reach.

## 2024-05-29 NOTE — Progress Notes (Signed)
 Daily Progress Note   Date: 05/29/2024   Patient Name: Tracy Jennings  DOB: [birth year]  MRN: [id number]  Age / Sex: 68 y.o., female  Attending Physician: Tobie Yetta HERO, MD Primary Care Physician: Zachary Idelia LABOR, MD Admit Date: 05/16/2024 Length of Stay: 13 days  Reason for Follow-up: Establishing goals of care  Past Medical History:  Diagnosis Date   Anemia 04/2018   low iron. to be started on supplements   Cervical cancer (HCC)    CKD (chronic kidney disease)    Stage IV   Complication of anesthesia    receceived too much anesthesia, that she was in coma for a couple days    COVID-19 virus detected 10/28/2019   Diabetes mellitus without complication (HCC)    type II   ESRD (end stage renal disease) (HCC)    Heart murmur    followed as a child only   HSIL (high grade squamous intraepithelial lesion) on Pap smear of cervix    Hyperlipidemia associated with type 2 diabetes mellitus (HCC)    Hypertension    Pancreatitis    Peripheral vascular disease (HCC)     Subjective:   Subjective: Chart Reviewed. Updates received. Patient Assessed. Created space and opportunity for patient  and family to explore thoughts and feelings regarding current medical situation.  Today's Discussion: Today before meeting with the patient/family, I reviewed the chart notes including nursing note from yesterday, family medicine note from yesterday, PT and OT notes from yesterday, nephrology note from yesterday, nursing note from today, nephrology note from today. I also reviewed vital signs, nursing flowsheets, medication administrations record, labs, and imaging. Labs reviewed include renal function panel which shows hyponatremia at 133, hyperkalemia at 5.4 is improved from 6.2 yesterday after dose of Lokelma , creatinine of 3.79 in the setting of ESRD with planned HD today.  Today I saw the patient at bedside, her brother was present.  Hospitalist Dr.  Tobie was in the room and wrapping up his  visit.  We discussed the plan of pulling her core track tube, encouraging her to eat, should finish antibiotics today, time for outcomes.  The patient is much more interactive than she has been over the past several days of seeing her.  Her brother states that she is more awake.  She greets me and makes and keeps eye contact.  Her brother states that she has been eating well.  We discussed complications from tube feeding and the decision that no PEG would be excepted given her profound diarrhea from the tube feeding which caused substantial pain.  He notes that pain is much better since the tube feeding is stopped with subsequent improvement in diarrhea.  He states that she ate 75% of her breakfast and he is currently feeding her lunch of which she has completed about a third and still eating well.  When I ask her she denies pain currently.  We discussed plan moving forward including plans for discontinuing the core track and continuing to encourage her to eat, time for outcomes and improvement.  They are also hopeful that diarrhea will continue to improve given that today is her last day of Augmentin .  Family continues to wish for full code and full scope of care.  I shared that palliative medicine would back off for couple days and allow time for improvement, will follow-up on Saturday.  Patient's brother is in agreement.  I provided emotional and general support through therapeutic listening, empathy, sharing of stories, therapeutic touch, and  other techniques. I answered all questions and addressed all concerns to the best of my ability.  Review of Systems  Constitutional:        Denies pain in general  Respiratory:  Negative for shortness of breath.   Gastrointestinal:  Negative for abdominal pain, nausea and vomiting.    Objective:   Primary Diagnoses: Present on Admission:  Dementia with behavioral disturbance (HCC)  Chronic recurrent pancreatitis (HCC)  ESRD (end stage renal disease)  (HCC)  Type 2 diabetes mellitus with other specified complication (HCC)  Essential hypertension  Cervical cancer (HCC)  C. difficile colitis  PVD (peripheral vascular disease) (HCC)  Fistula   Vital Signs:  BP (!) 134/55 (BP Location: Right Arm)   Pulse 75   Temp 98.2 F (36.8 C) (Oral)   Resp 16   Ht 5' 3 (1.6 m)   Wt 59 kg   LMP  (LMP Unknown)   SpO2 96%   BMI 23.04 kg/m   Physical Exam Vitals and nursing note reviewed.  Constitutional:      General: She is sleeping. She is not in acute distress.    Appearance: She is ill-appearing. She is not toxic-appearing.  HENT:     Head: Normocephalic and atraumatic.  Cardiovascular:     Rate and Rhythm: Normal rate.  Pulmonary:     Effort: Pulmonary effort is normal. No respiratory distress.  Abdominal:     General: Abdomen is flat. There is no distension.  Skin:    General: Skin is warm and dry.  Neurological:     General: No focal deficit present.  Psychiatric:        Mood and Affect: Mood normal.        Behavior: Behavior normal.     Palliative Assessment/Data: 40%   Assessment & Plan:   HPI/Patient Profile:  68 y.o. female  with past medical history of cognitive impairment, ESRD on HD MWF, cervical cancer, HTN, HLD, anemia and recent hospitalizations from 6/24-7/22 for hypertensive urgency and delirium, and 8/17-8/24 for C. difficile colitis for which she was treated with p.o. vancomycin   admitted on 05/16/2024 with weakness, nausea, diarrhea and abdominal discomfort. CT abdomen and pelvis showed circumferential wall thickening of rectosigmoid colon suspicious for colitis, Colo uterine fistula, increased bilateral pleural effusions, left > right and area of rounded density in anterior LLL measuring 3.1 cm, and progressive anasarca.  General surgery at [facility] was consulted and recommended higher level of care and the ER physician discussed with Dr. Dasie general surgeon at [facility] who agreed to see patient in consult.  Patient was started on Dificid  and Flagyl  for C. difficile colitis.  Evaluated by general surgery - urgent surgical intervention is not indicated. PMT consulted to discuss GOC.   SUMMARY OF RECOMMENDATIONS   Full code Full scope of care Anticipate DC core track today Time for outcomes Ongoing goals of care pending evolution of clinical picture Palliative medicine will follow-up on Saturday Please notify us  of any significant change or new needs in the interim  Symptom Management:  Per primary team Palliative medicine is available to assist as needed  Code Status: Full Code  Prognosis: Unable to determine  Discharge Planning: To Be Determined  Discussed with: Patient, patient's family, medical team, nursing team  Thank you for allowing us  to participate in the care of LATICA HOHMANN PMT will continue to support holistically.  Billing based on MDM: High  Problems Addressed: One or more chronic illnesses with severe exacerbation, progression, or  side effects of treatment.  (Severe C. difficile requiring hospitalization and antibiotics, core track for nutrition given malnutrition and failure to thrive)  Amount and/or Complexity of Data: Category 1:Review of prior external note(s) from each unique source, Review of the result(s) of each unique test, and Assessment requiring an independent historian(s) and Category 3:Discussion of management or test interpretation with external physician/other qualified health care professional/appropriate source (not separately reported)  Risks: N/A  Detailed review of medical records (labs, imaging, vital signs), medically appropriate exam, discussed with treatment team, counseling and education to patient, family, & staff, documenting clinical information, medication management, coordination of care  [name], NP Palliative Medicine Team  Team Phone # [phone] (Nights/Weekends)  05/11/2021, 8:17 AM

## 2024-05-29 NOTE — Plan of Care (Signed)
   Problem: Coping: Goal: Level of anxiety will decrease Outcome: Progressing   Problem: Pain Managment: Goal: General experience of comfort will improve and/or be controlled Outcome: Progressing

## 2024-05-30 DIAGNOSIS — N824 Other female intestinal-genital tract fistulae: Secondary | ICD-10-CM | POA: Diagnosis not present

## 2024-05-30 LAB — CBC WITH DIFFERENTIAL/PLATELET
Abs Immature Granulocytes: 0.11 K/uL — ABNORMAL HIGH (ref 0.00–0.07)
Basophils Absolute: 0 K/uL (ref 0.0–0.1)
Basophils Relative: 0 %
Eosinophils Absolute: 0 K/uL (ref 0.0–0.5)
Eosinophils Relative: 0 %
HCT: 25.8 % — ABNORMAL LOW (ref 36.0–46.0)
Hemoglobin: 7.8 g/dL — ABNORMAL LOW (ref 12.0–15.0)
Immature Granulocytes: 1 %
Lymphocytes Relative: 2 %
Lymphs Abs: 0.4 K/uL — ABNORMAL LOW (ref 0.7–4.0)
MCH: 28.1 pg (ref 26.0–34.0)
MCHC: 30.2 g/dL (ref 30.0–36.0)
MCV: 92.8 fL (ref 80.0–100.0)
Monocytes Absolute: 1.2 K/uL — ABNORMAL HIGH (ref 0.1–1.0)
Monocytes Relative: 8 %
Neutro Abs: 13.2 K/uL — ABNORMAL HIGH (ref 1.7–7.7)
Neutrophils Relative %: 89 %
Platelets: 232 K/uL (ref 150–400)
RBC: 2.78 MIL/uL — ABNORMAL LOW (ref 3.87–5.11)
RDW: 21.3 % — ABNORMAL HIGH (ref 11.5–15.5)
WBC: 14.9 K/uL — ABNORMAL HIGH (ref 4.0–10.5)
nRBC: 0 % (ref 0.0–0.2)

## 2024-05-30 LAB — COMPREHENSIVE METABOLIC PANEL WITH GFR
ALT: 12 U/L (ref 0–44)
AST: 17 U/L (ref 15–41)
Albumin: <1.5 g/dL — ABNORMAL LOW (ref 3.5–5.0)
Alkaline Phosphatase: 68 U/L (ref 38–126)
Anion gap: 15 (ref 5–15)
BUN: 32 mg/dL — ABNORMAL HIGH (ref 8–23)
CO2: 28 mmol/L (ref 22–32)
Calcium: 7.8 mg/dL — ABNORMAL LOW (ref 8.9–10.3)
Chloride: 92 mmol/L — ABNORMAL LOW (ref 98–111)
Creatinine, Ser: 2.99 mg/dL — ABNORMAL HIGH (ref 0.44–1.00)
GFR, Estimated: 17 mL/min — ABNORMAL LOW (ref >60)
Glucose, Bld: 232 mg/dL — ABNORMAL HIGH (ref 70–99)
Potassium: 4.4 mmol/L (ref 3.5–5.1)
Sodium: 135 mmol/L (ref 135–145)
Total Bilirubin: 0.7 mg/dL (ref 0.0–1.2)
Total Protein: 5.4 g/dL — ABNORMAL LOW (ref 6.5–8.1)

## 2024-05-30 LAB — CBC
HCT: 24.3 % — ABNORMAL LOW (ref 36.0–46.0)
Hemoglobin: 7.5 g/dL — ABNORMAL LOW (ref 12.0–15.0)
MCH: 28.3 pg (ref 26.0–34.0)
MCHC: 30.9 g/dL (ref 30.0–36.0)
MCV: 91.7 fL (ref 80.0–100.0)
Platelets: 252 K/uL (ref 150–400)
RBC: 2.65 MIL/uL — ABNORMAL LOW (ref 3.87–5.11)
RDW: 21.9 % — ABNORMAL HIGH (ref 11.5–15.5)
WBC: 13.1 K/uL — ABNORMAL HIGH (ref 4.0–10.5)
nRBC: 0 % (ref 0.0–0.2)

## 2024-05-30 LAB — GLUCOSE, CAPILLARY
Glucose-Capillary: 180 mg/dL — ABNORMAL HIGH (ref 70–99)
Glucose-Capillary: 109 mg/dL — ABNORMAL HIGH (ref 70–99)
Glucose-Capillary: 67 mg/dL — ABNORMAL LOW (ref 70–99)
Glucose-Capillary: 91 mg/dL (ref 70–99)
Glucose-Capillary: 80 mg/dL (ref 70–99)

## 2024-05-30 LAB — MAGNESIUM: Magnesium: 1.9 mg/dL (ref 1.7–2.4)

## 2024-05-30 LAB — PHOSPHORUS: Phosphorus: 3.5 mg/dL (ref 2.5–4.6)

## 2024-05-30 MED ORDER — OXYCODONE HCL 5 MG PO TABS
2.5000 mg | ORAL_TABLET | Freq: Four times a day (QID) | ORAL | Status: DC | PRN
  Administered 2024-05-31 – 2024-06-23 (×11): 2.5 mg via ORAL
  Filled 2024-05-30 (×11): qty 1

## 2024-05-30 MED ORDER — ACETAMINOPHEN 325 MG PO TABS
650.0000 mg | ORAL_TABLET | Freq: Four times a day (QID) | ORAL | Status: DC | PRN
  Administered 2024-05-30 – 2024-05-31 (×2): 650 mg via ORAL
  Filled 2024-05-30 (×2): qty 2

## 2024-05-30 MED ORDER — DARBEPOETIN ALFA 100 MCG/0.5ML IJ SOSY
100.0000 ug | PREFILLED_SYRINGE | INTRAMUSCULAR | Status: DC
Start: 2024-05-31 — End: 2024-06-10
  Administered 2024-05-31: 100 ug via SUBCUTANEOUS
  Filled 2024-05-30 (×2): qty 0.5

## 2024-05-30 MED ORDER — GLUCOSE 40 % PO GEL: 1.0000 | ORAL | Status: AC

## 2024-05-30 MED ORDER — INSULIN ASPART 100 UNIT/ML IJ SOLN
0.0000 [IU] | Freq: Three times a day (TID) | INTRAMUSCULAR | Status: DC
  Administered 2024-05-31 – 2024-06-01 (×2): 1 [IU] via SUBCUTANEOUS
  Administered 2024-06-08: 2 [IU] via SUBCUTANEOUS
  Administered 2024-06-09: 1 [IU] via SUBCUTANEOUS

## 2024-05-30 MED ORDER — ACETAMINOPHEN 650 MG RE SUPP: 650.0000 mg | Freq: Four times a day (QID) | RECTAL | Status: DC | PRN

## 2024-05-30 MED ORDER — INSULIN ASPART 100 UNIT/ML IJ SOLN: 0.0000 [IU] | Freq: Every day | INTRAMUSCULAR | Status: DC

## 2024-05-30 MED ORDER — GLUCOSE 40 % PO GEL
ORAL | Status: AC
  Administered 2024-05-30: 31 g via ORAL
  Filled 2024-05-30: qty 1.21

## 2024-05-30 NOTE — Plan of Care (Signed)
  Problem: Nutrition: Goal: Adequate nutrition will be maintained Outcome: Progressing   Problem: Pain Managment: Goal: General experience of comfort will improve and/or be controlled Outcome: Progressing

## 2024-05-30 NOTE — Progress Notes (Signed)
 Pt BS was 69 before the protocol. Recheck BS in 15 mintues 91.

## 2024-05-30 NOTE — TOC Progression Note (Signed)
 Transition of Care [facility]) - Progression Note    Patient Details  Name: Tracy Jennings MRN: [id number] Date of Birth: 07-19-1956  Transition of Care [facility]) CM/SW Contact  Geralda Baumgardner LITTIE Moose, [place] Phone Number: 05/30/2024, 12:17 PM  Clinical Narrative:    CSW emailed pt son, [name], a list of SNF bed offers and provided call back informaiton once a decision had been made. CSW will continue to follow.   Expected Discharge Plan: Skilled Nursing Facility Barriers to Discharge: Continued Medical Work up, English as a second language teacher, SNF Pending bed offer               Expected Discharge Plan and Services       Living arrangements for the past 2 months: Single Family Home                                       Social Drivers of Health (SDOH) Interventions SDOH Screenings   Food Insecurity: No Food Insecurity (05/16/2024)  Recent Concern: Food Insecurity - Food Insecurity Present (04/24/2024)   Received from [facility]  Housing: Low Risk  (05/16/2024)  Transportation Needs: No Transportation Needs (05/16/2024)  Recent Concern: Transportation Needs - Unmet Transportation Needs (04/24/2024)   Received from [facility]  Utilities: Not At Risk (05/16/2024)  Financial Resource Strain: High Risk (04/24/2024)   Received from [facility]  Social Connections: Unknown (05/16/2024)  Tobacco Use: Low Risk  (05/16/2024)    Readmission Risk Interventions     No data to display

## 2024-05-30 NOTE — Progress Notes (Signed)
 Physical Therapy Treatment Patient Details Name: Tracy Jennings MRN: [id number] DOB: 10-30-1955 Today's Date: 05/30/2024   History of Present Illness Patient is 68 yo female admitted on 05/16/24 for generalized weakness concerns for colouterine fistula. + C. Diff. PMH significant for dementia, HTN, DM, HLD, cervical cancer, recurrent pancreatitis, ESRD on HD.    PT Comments  Pt received supine in bed on arrival, agreeable to session with pt verbalizing yea a few times throughout session. Pt with improved initiation and sequencing of mobility this session with pt able to roll R/L with min A to complete per-care. Pt continues to require assist to manage Les to perform bed mobility however pt able to elevate trunk with mod A and maintain sitting balance without assist with improved midline posture. Standing attempts deferred this session as pt returning trunk to supine. Pt continues to benefit from skilled PT services to progress toward functional mobility goals.     If plan is discharge home, recommend the following: A lot of help with walking and/or transfers;A lot of help with bathing/dressing/bathroom;Assist for transportation;Help with stairs or ramp for entrance;Supervision due to cognitive status;Assistance with cooking/housework;Direct supervision/assist for medications management;Direct supervision/assist for financial management   Can travel by private vehicle     No  Equipment Recommendations  None recommended by PT;Other (comment)    Recommendations for Other Services       Precautions / Restrictions Precautions Precautions: Fall Recall of Precautions/Restrictions: Impaired Restrictions Weight Bearing Restrictions Per Provider Order: No     Mobility  Bed Mobility Overal bed mobility: Needs Assistance Bed Mobility: Supine to Sit, Sit to Supine, Rolling Rolling: Min assist   Supine to sit: HOB elevated, Used rails, Mod assist Sit to supine: Mod assist, HOB elevated, Used  rails   General bed mobility comments: pt with improved initiation to roll R/L and to elevate trunk to sitting    Transfers Overall transfer level: Needs assistance                 General transfer comment: not attempted this session as pt laying back down and declining to come back to sitting    Ambulation/Gait                   Stairs             Wheelchair Mobility     Tilt Bed    Modified Rankin (Stroke Patients Only)       Balance Overall balance assessment: Needs assistance Sitting-balance support: Feet supported Sitting balance-Leahy Scale: Fair Sitting balance - Comments: able to maintain without assist, at midline with fair posture                                    Communication Communication Communication: No apparent difficulties  Cognition Arousal: Lethargic Behavior During Therapy: Flat affect   PT - Cognitive impairments: History of cognitive impairments, Initiation, Awareness, Safety/Judgement                       PT - Cognition Comments: pt with eyes open but minally verbally responsive, stating yea a few times Following commands: Impaired Following commands impaired: Follows one step commands with increased time    Cueing Cueing Techniques: Verbal cues, Gestural cues, Tactile cues, Visual cues  Exercises      General Comments General comments (skin integrity, edema, etc.): pt brother present and bedside throughout  session      Pertinent Vitals/Pain Pain Assessment Pain Assessment: Faces Faces Pain Scale: Hurts little more Pain Location: peri area with peri-care Pain Descriptors / Indicators: Tender, Discomfort Pain Intervention(s): Monitored during session, Limited activity within patient's tolerance    Home Living                          Prior Function            PT Goals (current goals can now be found in the care plan section) Acute Rehab PT Goals Patient Stated Goal:  Patient did not state personal goals. PT Goal Formulation: With patient/family Time For Goal Achievement: 06/01/24 Progress towards PT goals: Progressing toward goals    Frequency    Min 2X/week      PT Plan      Co-evaluation              AM-PAC PT 6 Clicks Mobility   Outcome Measure  Help needed turning from your back to your side while in a flat bed without using bedrails?: A Little Help needed moving from lying on your back to sitting on the side of a flat bed without using bedrails?: A Lot Help needed moving to and from a bed to a chair (including a wheelchair)?: Total Help needed standing up from a chair using your arms (e.g., wheelchair or bedside chair)?: Total Help needed to walk in hospital room?: Total Help needed climbing 3-5 steps with a railing? : Total 6 Click Score: 9    End of Session   Activity Tolerance: Patient limited by fatigue (self limiting) Patient left: in bed;with call bell/phone within reach;with bed alarm set Nurse Communication: Mobility status PT Visit Diagnosis: Muscle weakness (generalized) (M62.81);Unsteadiness on feet (R26.81)     Time: 8549-8490 PT Time Calculation (min) (ACUTE ONLY): 19 min  Charges:    $Therapeutic Activity: 8-22 mins PT General Charges $$ ACUTE PT VISIT: 1 Visit                     Ariel Dimitri R. PTA Acute Rehabilitation Services Office: [phone]   [name] 05/30/2024, 3:50 PM

## 2024-05-30 NOTE — Progress Notes (Signed)
 PT Cancellation Note  Patient Details Name: Tracy Jennings MRN: [id number] DOB: 03/17/1956   Cancelled Treatment:    Reason Eval/Treat Not Completed: (P) Fatigue/lethargy limiting ability to participate, pt lethargic, suspect from pain medication as pt brother present and stating she has been sleeping all morning after medication and he was unable to fed her breakfast due to lethargy. Will check back as schedule allows to continue with PT POC.  [name]. PTA Acute Rehabilitation Services Office: [phone]    [name] 05/30/2024, 1:45 PM

## 2024-05-30 NOTE — Progress Notes (Signed)
 [facility] Progress Note Patient: Tracy Jennings FMW:[id number] DOB: [birth year]  DOA: 05/16/2024 DOS: the patient was seen and examined on 05/30/2024  Brief Hospital Course: 68 year old F with PMH of cognitive impairment, ESRD on HD MWF, cervical cancer, HTN, HLD, anemia and recent hospitalizations from 6/24-7/22 for hypertensive urgency and delirium, and 8/17-8/24 for C. difficile colitis for which she was treated with p.o. vancomycin  brought back to [facility] from PCP office due to weakness, nausea, diarrhea and abdominal discomfort.  Was brought to [facility] for concern for Colo uterine fistula.  Assessment and Plan: C. difficile colitis ID was consulted. Was initially on oral vancomycin . Later on was on Dificid  completed for 10 days. Now back on oral vancomycin  for 7 more days.  EOT 9/24. Diarrhea appears to be improving on 9/18. May need Questran.   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 Had significant abdominal pain.  Currently pain resolved. General surgery was consulted and recommend conservative management of treatment of C. difficile as well as outpatient follow-up. No indication for inpatient surgery per surgery. Tube feedings discontinued.  Cortrak removed due to ongoing diarrhea.   Persistent leukocytosis/bandemia:  Improving. Will monitor.   Loculated left pleural effusion/masslike consolidation:  Seems chronic.  Has no respiratory symptoms.  IR consulted for thoracocentesis but pleural effusion felt to be small to tap safely.  Treated with Augmentin . Repeat chest x-ray in 4 to 6 weeks.  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 Currently on sliding scale insulin .   Dementia without behavioral disturbance:  Acute delirium. Appears to be confused. Minimizing pain medication. Oriented to self and family.  Failure to thrive/poor appetite/unintentional weight loss:  Significant weight loss. Hopefully oral intake will improve. Will monitor.   Uncontrolled hypertension:  Improved  Continue home clonidine  patch, losartan , Coreg , hydralazine  and Aldactone  Cardizem  increased from 180 mg daily to 360 mg daily on 9/5 Ultrafiltration per nephrology. Continue monitoring   Hyperkalemia Monitor.   Acute right IJ DVT:  Incidental finding.  Venous Doppler was ordered for left forearm swelling but showed acute right IJ DVT. Started on Eliquis    ESRD on HD MWF Nephrology following. Appreciate assistance. Continue hemodialysis as scheduled.  Anemia of renal disease:  Hemoglobin drifting low.  Monitor CBC twice daily.  Received PRBC on 9/12. Will monitor.   Physical deconditioning Therapy recommended SNF.  Seems family is in agreement now   History of uterine cancer: Outpatient follow-up.   Severe malnutrition/failure to thrive/hypoalbuminemia Will add Nepro.  Goals of care Palliative care consulted for goals of care.  For now holding off on further conversation.  Monitor for improvement.   Subjective: More sleepy after receiving oxycodone .  No nausea no vomiting no fever no chills.  Diarrhea improving.  Physical Exam: Clear to auscultation. Aortic systolic murmur.  S1-S2 present. Bowel sound present.  Nontender. No edema. Left upper extremity swelling seen No focal deficit, oriented to self.  Data Reviewed: I have Reviewed nursing notes, Vitals, and Lab results. Since last encounter, pertinent lab results CBC and BMP   . I have ordered test including CBC and BMP  .   Disposition: Status is: Inpatient Remains inpatient appropriate because: Monitor for improvement in diarrhea and oral intake  Place and maintain sequential compression device Start: 05/17/24 1416 SCDs Start: 05/17/24 0000 apixaban  (ELIQUIS ) tablet 5 mg    Family Communication: Family at bedside Level of care: Telemetry Medical continue Vitals:   05/29/24 2222  05/30/24 0519 05/30/24 0851 05/30/24 1846  BP: (!) 116/54 (!) 146/55 (!) 128/98 136/67  Pulse: 75 71 67 64  Resp: 16 16 16    Temp: 98.1 F (36.7 C) 98 F (36.7 C) 98.6 F (37 C) 98.3 F (36.8 C)  TempSrc: Oral Axillary  Axillary  SpO2: 94% 97% 99% 98%  Weight:      Height:         Author: Yetta Blanch, MD 05/30/2024 7:04 PM  Please look on [email or web address] to find out who is on call.

## 2024-05-30 NOTE — Plan of Care (Signed)
  Problem: Activity: Goal: Risk for activity intolerance will decrease Outcome: Progressing   Problem: Nutrition: Goal: Adequate nutrition will be maintained Outcome: Not Progressing   Problem: Elimination: Goal: Will not experience complications related to bowel motility Outcome: Progressing   Problem: Pain Managment: Goal: General experience of comfort will improve and/or be controlled Outcome: Not Progressing

## 2024-05-30 NOTE — Progress Notes (Signed)
 [name] Progress Note   Subjective:   Seen in room. Nods head yes a few times, but otherwise not speaking. Endorses same pain. Nods yes that she has been eating. NG tube is out.  Objective Vitals:   05/29/24 1831 05/29/24 2222 05/30/24 0519 05/30/24 0851  BP: (!) 138/58 (!) 116/54 (!) 146/55 (!) 128/98  Pulse: 79 75 71 67  Resp: 16 16 16 16   Temp: 98.5 F (36.9 C) 98.1 F (36.7 C) 98 F (36.7 C) 98.6 F (37 C)  TempSrc: Oral Oral Axillary   SpO2: 95% 94% 97% 99%  Weight:      Height:       Physical Exam General: Frail woman, NAD. Room air Heart: RRR Lungs: CTAB Extremities: Trace LE edema Dialysis Access: L AVG  Additional Objective Labs: Basic Metabolic Panel: Recent Labs  Lab 05/26/24 0457 05/27/24 0221 05/29/24 0813 05/30/24 0314  NA  --  130* 133* 135  K 5.8* 6.0* 5.4* 4.4  CL  --  91* 91* 92*  CO2  --  28 29 28   GLUCOSE  --  154* 97 232*  BUN  --  54* 45* 32*  CREATININE  --  3.55* 3.79* 2.99*  CALCIUM   --  7.4* 8.0* 7.8*  PHOS 2.5  --  3.8 3.5   Liver Function Tests: Recent Labs  Lab 05/25/24 0410 05/29/24 0813 05/30/24 0314  AST 15  --  17  ALT 8  --  12  ALKPHOS 68  --  68  BILITOT 0.6  --  0.7  PROT 5.4*  --  5.4*  ALBUMIN 1.6* <1.5* <1.5*   CBC: Recent Labs  Lab 05/24/24 0946 05/25/24 0410 05/29/24 0813 05/30/24 0314  WBC 23.4* 24.2* 14.6* 14.9*  NEUTROABS  --   --   --  13.2*  HGB 6.6* 9.2* 8.4* 7.8*  HCT 22.3* 29.1* 27.5* 25.8*  MCV 100.0 89.5 91.4 92.8  PLT 183 174 212 232   Medications:   (feeding supplement) PROSource Plus  30 mL Oral BID BM   apixaban   5 mg Oral BID   carvedilol   25 mg Oral BID WC   Chlorhexidine  Gluconate Cloth  6 each Topical Q0600   cloNIDine   0.3 mg Transdermal Weekly   darbepoetin (ARANESP ) injection - DIALYSIS  60 mcg Subcutaneous Q Fri-1800   diltiazem   360 mg Oral Daily   famotidine   20 mg Oral Daily   ferrous sulfate   325 mg Oral Daily   Gerhardt's butt cream   Topical  QID   insulin  aspart  0-15 Units Subcutaneous TID WC   lactose free nutrition  237 mL Oral TID WC   losartan   50 mg Oral Daily   mirtazapine   7.5 mg Oral QHS   multivitamin  1 tablet Oral QHS   sodium chloride  flush  10-40 mL Intracatheter Q12H   thiamine   100 mg Oral Daily   vancomycin   125 mg Oral Daily    Dialysis Orders MWF [facility]  3.5hrs, 400/500, EDW 53kg, 2K/2.5Ca bath, AVG, heparin  6000 unit bolus + 400u/hr pump - Calcitriol  1.62mcg with HD - Sensipar 60mg  with HD - Micera 75mcg Q 4 weeks - per records, dose just ordered (not given) - Binder: Calcium  Acetate 667mh TID with meals   Assessment/Plan: Colo-uterine fistula: per abd CT. Appears surgery has signed off and plans outpt elective management; ID recommended Augmentin . C-diff colitis: On PO Vancomycin , WBC remains high but improving. LUE edema: LUE duplex ordered by Hospitalist -  no issue with LUE but acute non occlusive RIJ clot noted - started on eliquis .  No issues with AVG thus far. Monitor. ESRD: on HD MWF - next HD tomorrow. HTN/volume: HTN on 5 BP agents, BP lower here - have stopped spironolactone . Min edema on exam. Anemia of ESRD: Hgb 7.8, continue Aranesp  q Friday, will ^ dose. 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 Dementia Malnutrition: NG tube placed, TF started 9/10 - stopped 9/14 (exacerbated diarrhea), NG tube now out. Eating better-ish. Hyperkalemia: S/p 1 dose Lokelma  followed by HD on 9/15. GOC: Palliative consulted -  ongoing loose stools, hospice considered but now eating a little better, so family has elected to stay the course, full scope for now. Technically tolerates HD, but doubt it is contributing to QOL. SNF placement pending.   Tracy Boehringer, PA-C 05/30/2024, 1:22 PM  [employer]

## 2024-05-31 DIAGNOSIS — N824 Other female intestinal-genital tract fistulae: Secondary | ICD-10-CM | POA: Diagnosis not present

## 2024-05-31 LAB — CBC
HCT: 25.2 % — ABNORMAL LOW (ref 36.0–46.0)
Hemoglobin: 7.6 g/dL — ABNORMAL LOW (ref 12.0–15.0)
MCH: 27.7 pg (ref 26.0–34.0)
MCHC: 30.2 g/dL (ref 30.0–36.0)
MCV: 92 fL (ref 80.0–100.0)
Platelets: 257 K/uL (ref 150–400)
RBC: 2.74 MIL/uL — ABNORMAL LOW (ref 3.87–5.11)
RDW: 21.8 % — ABNORMAL HIGH (ref 11.5–15.5)
WBC: 13 K/uL — ABNORMAL HIGH (ref 4.0–10.5)
nRBC: 0 % (ref 0.0–0.2)

## 2024-05-31 LAB — CBC WITH DIFFERENTIAL/PLATELET
Abs Immature Granulocytes: 0.09 K/uL — ABNORMAL HIGH (ref 0.00–0.07)
Basophils Absolute: 0.1 K/uL (ref 0.0–0.1)
Basophils Relative: 1 %
Eosinophils Absolute: 0.1 K/uL (ref 0.0–0.5)
Eosinophils Relative: 0 %
HCT: 23.2 % — ABNORMAL LOW (ref 36.0–46.0)
Hemoglobin: 7.1 g/dL — ABNORMAL LOW (ref 12.0–15.0)
Immature Granulocytes: 1 %
Lymphocytes Relative: 4 %
Lymphs Abs: 0.5 K/uL — ABNORMAL LOW (ref 0.7–4.0)
MCH: 28.1 pg (ref 26.0–34.0)
MCHC: 30.6 g/dL (ref 30.0–36.0)
MCV: 91.7 fL (ref 80.0–100.0)
Monocytes Absolute: 0.9 K/uL (ref 0.1–1.0)
Monocytes Relative: 8 %
Neutro Abs: 10.3 K/uL — ABNORMAL HIGH (ref 1.7–7.7)
Neutrophils Relative %: 86 %
Platelets: 222 K/uL (ref 150–400)
RBC: 2.53 MIL/uL — ABNORMAL LOW (ref 3.87–5.11)
RDW: 21.6 % — ABNORMAL HIGH (ref 11.5–15.5)
WBC: 11.9 K/uL — ABNORMAL HIGH (ref 4.0–10.5)
nRBC: 0 % (ref 0.0–0.2)

## 2024-05-31 LAB — FOLATE: Folate: 11.4 ng/mL (ref >5.9)

## 2024-05-31 LAB — COMPREHENSIVE METABOLIC PANEL WITH GFR
ALT: 12 U/L (ref 0–44)
AST: 16 U/L (ref 15–41)
Albumin: <1.5 g/dL — ABNORMAL LOW (ref 3.5–5.0)
Alkaline Phosphatase: 84 U/L (ref 38–126)
Anion gap: 10 (ref 5–15)
BUN: 39 mg/dL — ABNORMAL HIGH (ref 8–23)
CO2: 30 mmol/L (ref 22–32)
Calcium: 7.7 mg/dL — ABNORMAL LOW (ref 8.9–10.3)
Chloride: 95 mmol/L — ABNORMAL LOW (ref 98–111)
Creatinine, Ser: 3.69 mg/dL — ABNORMAL HIGH (ref 0.44–1.00)
GFR, Estimated: 13 mL/min — ABNORMAL LOW (ref >60)
Glucose, Bld: 110 mg/dL — ABNORMAL HIGH (ref 70–99)
Potassium: 4.8 mmol/L (ref 3.5–5.1)
Sodium: 135 mmol/L (ref 135–145)
Total Bilirubin: 0.4 mg/dL (ref 0.0–1.2)
Total Protein: 5.3 g/dL — ABNORMAL LOW (ref 6.5–8.1)

## 2024-05-31 LAB — IRON AND TIBC: Iron: 31 ug/dL (ref 28–170)

## 2024-05-31 LAB — GLUCOSE, CAPILLARY
Glucose-Capillary: 91 mg/dL (ref 70–99)
Glucose-Capillary: 136 mg/dL — ABNORMAL HIGH (ref 70–99)
Glucose-Capillary: 125 mg/dL — ABNORMAL HIGH (ref 70–99)
Glucose-Capillary: 104 mg/dL — ABNORMAL HIGH (ref 70–99)

## 2024-05-31 LAB — FERRITIN: Ferritin: 3278 ng/mL — ABNORMAL HIGH (ref 11–307)

## 2024-05-31 LAB — MAGNESIUM: Magnesium: 1.8 mg/dL (ref 1.7–2.4)

## 2024-05-31 LAB — VITAMIN B12: Vitamin B-12: 603 pg/mL (ref 180–914)

## 2024-05-31 MED ORDER — ACETAMINOPHEN 325 MG PO TABS
ORAL_TABLET | ORAL | Status: AC
  Filled 2024-05-31: qty 2

## 2024-05-31 MED ORDER — B COMPLEX-C PO TABS
1.0000 | ORAL_TABLET | Freq: Every day | ORAL | Status: DC
  Administered 2024-05-31 – 2024-06-14 (×14): 1 via ORAL
  Filled 2024-05-31 (×16): qty 1

## 2024-05-31 NOTE — Progress Notes (Signed)
 [facility] Progress Note   Subjective:   Seen in room. Just completed dialysis. Conversant, talkative today. Wants to eat.   Objective Vitals:   05/31/24 1100 05/31/24 1130 05/31/24 1146 05/31/24 1155  BP: (!) 144/69 (!) 125/45 (!) 161/57 (!) 176/53  Pulse: 69 70  73  Resp: 15 10 16 12   Temp:   98.7 F (37.1 C)   TempSrc:   Oral   SpO2: 96% 99% 97% 91%  Weight:   53.1 kg   Height:       Physical Exam General: Frail woman, NAD. Room air Heart: RRR Lungs: CTAB Extremities: Trace LE edema Dialysis Access: L AVG  Additional Objective Labs: Basic Metabolic Panel: Recent Labs  Lab 05/26/24 0457 05/27/24 0221 05/29/24 0813 05/30/24 0314 05/31/24 0240  NA  --    < > 133* 135 135  K 5.8*   < > 5.4* 4.4 4.8  CL  --    < > 91* 92* 95*  CO2  --    < > 29 28 30   GLUCOSE  --    < > 97 232* 110*  BUN  --    < > 45* 32* 39*  CREATININE  --    < > 3.79* 2.99* 3.69*  CALCIUM   --    < > 8.0* 7.8* 7.7*  PHOS 2.5  --  3.8 3.5  --    < > = values in this interval not displayed.   Liver Function Tests: Recent Labs  Lab 05/25/24 0410 05/29/24 0813 05/30/24 0314 05/31/24 0240  AST 15  --  17 16  ALT 8  --  12 12  ALKPHOS 68  --  68 84  BILITOT 0.6  --  0.7 0.4  PROT 5.4*  --  5.4* 5.3*  ALBUMIN 1.6* <1.5* <1.5* <1.5*   CBC: Recent Labs  Lab 05/25/24 0410 05/29/24 0813 05/30/24 0314 05/30/24 1717 05/31/24 0240  WBC 24.2* 14.6* 14.9* 13.1* 11.9*  NEUTROABS  --   --  13.2*  --  10.3*  HGB 9.2* 8.4* 7.8* 7.5* 7.1*  HCT 29.1* 27.5* 25.8* 24.3* 23.2*  MCV 89.5 91.4 92.8 91.7 91.7  PLT 174 212 232 252 222   Medications:   (feeding supplement) PROSource Plus  30 mL Oral BID BM   apixaban   5 mg Oral BID   carvedilol   25 mg Oral BID WC   Chlorhexidine  Gluconate Cloth  6 each Topical Q0600   cloNIDine   0.3 mg Transdermal Weekly   darbepoetin (ARANESP ) injection - DIALYSIS  100 mcg Subcutaneous Q Fri-1800   diltiazem   360 mg Oral Daily   famotidine   20  mg Oral Daily   ferrous sulfate   325 mg Oral Daily   Gerhardt's butt cream   Topical QID   insulin  aspart  0-5 Units Subcutaneous QHS   insulin  aspart  0-9 Units Subcutaneous TID WC   lactose free nutrition  237 mL Oral TID WC   losartan   50 mg Oral Daily   multivitamin  1 tablet Oral QHS   sodium chloride  flush  10-40 mL Intracatheter Q12H   thiamine   100 mg Oral Daily   vancomycin   125 mg Oral Daily    Dialysis Orders MWF [facility]  3.5hrs, 400/500, EDW 53kg, 2K/2.5Ca bath, AVG, heparin  6000 unit bolus + 400u/hr pump - Calcitriol  1.34mcg with HD - Sensipar 60mg  with HD - Micera 75mcg Q 4 weeks - per records, dose just ordered (not given) - Binder: Calcium   Acetate 667mh TID with meals   Assessment/Plan: Colo-uterine fistula: per abd CT. Appears surgery has signed off and plans outpt elective management; ID recommended Augmentin . C-diff colitis: On PO Vancomycin , WBC remains high but improving. LUE edema: LUE duplex ordered by Hospitalist - no issue with LUE but acute non occlusive RIJ clot noted - started on eliquis .  No issues with AVG thus far. Monitor. ESRD: on HD MWF. HD today  HTN/volume: HTN on 5 BP agents, BP lower here - have stopped spironolactone . Min edema on exam. Anemia of ESRD: Hgb 7.8, continue Aranesp  q Friday. Last 100 mcg on 9/19.  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 Dementia Malnutrition: NG tube placed, TF started 9/10 - stopped 9/14 (exacerbated diarrhea), NG tube now out. Eating better-ish. Hyperkalemia: S/p 1 dose Lokelma  followed by HD on 9/15. GOC: Palliative consulted -  ongoing loose stools, hospice considered but now eating a little better, so family has elected to stay the course, full scope for now. Technically tolerates HD, but doubt it is contributing to QOL. SNF placement pending.  Maisie Ronnald Acosta PA-C [facility] 05/31/2024,12:33 PM

## 2024-05-31 NOTE — Progress Notes (Signed)
 Received patient in bed to unit.  Alert and oriented.  Informed consent signed and in chart.   TX duration: 3 hours  Patient tolerated well.  Transported back to the room  Alert, without acute distress.  Hand-off given to patient's nurse.   Access used: Left AV fistula Access issues: none  Total UF removed: 1.5L Medication(s) given: Tylenol    05/31/24 1146  Vitals  Temp 98.7 F (37.1 C)  Temp Source Oral  BP (!) 161/57  ECG Heart Rate 73  Resp 16  Weight 53.1 kg  Type of Weight Post-Dialysis  Oxygen Therapy  SpO2 97 %  O2 Device Room Air  During Treatment Monitoring  Duration of HD Treatment -hour(s) 3 hour(s)  HD Safety Checks Performed Yes  Intra-Hemodialysis Comments Tx completed  Post Treatment  Dialyzer Clearance Clear  Liters Processed 60.7  Fluid Removed (mL) 1500 mL  Tolerated HD Treatment Yes  Post-Hemodialysis Comments goal met  AVG/AVF Arterial Site Held (minutes) 7 minutes  AVG/AVF Venous Site Held (minutes) 7 minutes  Fistula / Graft Left Upper arm Arteriovenous fistula  No placement date or time found.   Orientation: Left  Access Location: Upper arm  Access Type: Arteriovenous fistula  Site Condition No complications  Fistula / Graft Assessment Present;Thrill;Bruit  Status Deaccessed  Drainage Description None     Camellia Brasil LPN [facility]

## 2024-05-31 NOTE — Inpatient Diabetes Management (Signed)
 Inpatient Diabetes Program Recommendations  AACE/ADA: New Consensus Statement on Inpatient Glycemic Control  Target Ranges:  Prepandial:   less than 140 mg/dL      Peak postprandial:   less than 180 mg/dL (1-2 hours)      Critically ill patients:  140 - 180 mg/dL    Latest Reference Range & Units 05/30/24 08:48 05/30/24 12:01 05/30/24 17:47 05/30/24 18:34 05/30/24 21:10  Glucose-Capillary 70 - 99 mg/dL 819 (H)  Novolog  3 units 109 (H) 67 (L) 91 80   Review of Glycemic Control  Diabetes history: DM2 Outpatient Diabetes medications: None Current orders for Inpatient glycemic control: Novolog  0-9 units TID with meals, Novolog  0-5 units QHS  Inpatient Diabetes Program Recommendations:    Insulin : May want to consider decreasing Novolog  correction to 0-6 units TID with meals.  Thanks, Earnie Gainer, RN, MSN, CDCES Diabetes Coordinator Inpatient Diabetes Program [phone] (Team Pager from 8am to 5pm)

## 2024-05-31 NOTE — Progress Notes (Signed)
 [facility] Progress Note Patient: Tracy Jennings FMW:[id number] DOB: [birth year]  DOA: 05/16/2024 DOS: the patient was seen and examined on 05/31/2024  Brief Hospital Course: 68 year old F with PMH of cognitive impairment, ESRD on HD MWF, cervical cancer, HTN, HLD, anemia and recent hospitalizations from 6/24-7/22 for hypertensive urgency and delirium, and 8/17-8/24 for C. difficile colitis for which she was treated with p.o. vancomycin  brought back to [facility] from PCP office due to weakness, nausea, diarrhea and abdominal discomfort.  Was brought to [facility] for concern for Colo uterine fistula.  Assessment and Plan: C. difficile colitis ID was consulted. Was initially on oral vancomycin . Later on was on Dificid  completed for 10 days. Now back on oral vancomycin  for 7 more days.  EOT 9/24. Diarrhea appears to be improving starting 9/18.   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 Had significant abdominal pain.  Currently pain resolved. General surgery was consulted and recommend conservative management of treatment of C. difficile as well as outpatient follow-up. No indication for inpatient surgery per surgery. Tolerating oral diet.  So far no diarrhea.   Persistent leukocytosis/bandemia:  Improving.  Loculated left pleural effusion/masslike consolidation:  Seems chronic.  Has no respiratory symptoms.  IR consulted for thoracocentesis but pleural effusion felt to be small to tap safely.  Treated with Augmentin . Repeat chest x-ray in 4 to 6 weeks.  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 Currently on sliding scale insulin .   Dementia without behavioral disturbance:  Acute delirium. Appears to be confused. Minimizing pain medication. Oriented to self and family.  Failure to thrive/poor appetite/unintentional weight loss: Significant weight loss. Hopefully oral intake will improve. Will  monitor.   Uncontrolled hypertension:  Improved  Continue home clonidine  patch, losartan , Coreg , hydralazine  and Aldactone  Cardizem  increased from 180 mg daily to 360 mg daily on 9/5 Ultrafiltration per nephrology. Continue monitoring   Hyperkalemia Monitor.   Acute right IJ DVT:  Incidental finding.  Venous Doppler was ordered for left forearm swelling but showed acute right IJ DVT. Started on Eliquis    ESRD on HD MWF Nephrology following. Appreciate assistance. Continue hemodialysis as scheduled.  Anemia of renal disease:  Hemoglobin stable at around 7 right now. Folic acid  was low but within normal range. B12 also within low normal range. Will initiate supplementation. Received PRBC on 9/12. Will monitor.   Physical deconditioning Therapy recommended SNF.  Seems family is in agreement now   History of uterine cancer: Outpatient follow-up.   Severe malnutrition/failure to thrive/hypoalbuminemia Will add Nepro.  Goals of care Palliative care consulted for goals of care.  For now holding off on further conversation.  Monitor for improvement.   Subjective: More alert today.  No nausea vomiting no fever no chills.  Denies having any pain.  No diarrhea.  Physical Exam: Clear to auscultation. S1-S2 present Bowel sounds present.  Nontender. No edema.  Data Reviewed: I have Reviewed nursing notes, Vitals, and Lab results. Since last encounter, pertinent lab results CBC and BMP   . I have ordered test including CBC BMP B12 folic acid  iron level  .   Disposition: Status is: Inpatient Remains inpatient appropriate because: Monitor for stability of H&H  Place and maintain sequential compression device Start: 05/17/24 1416 SCDs Start: 05/17/24 0000 apixaban  (ELIQUIS ) tablet 5 mg   Family Communication: Family at bedside Level of care: Telemetry Medical continue Vitals:   05/31/24 1146 05/31/24 1155 05/31/24 1256 05/31/24 1559  BP: (!) 161/57 (!) 176/53 (!) 147/56  (!) 137/48  Pulse:  73  77  Resp: 16 12  16   Temp: 98.7 F (37.1 C)   98.9 F (37.2 C)  TempSrc: Oral   Oral  SpO2: 97% 91%  99%  Weight: 53.1 kg     Height:         Author: Yetta Blanch, MD 05/31/2024 5:51 PM  Please look on [email or web address] to find out who is on call.

## 2024-05-31 NOTE — Plan of Care (Signed)
  Problem: Skin Integrity: Goal: Risk for impaired skin integrity will decrease Outcome: Progressing   Problem: Pain Managment: Goal: General experience of comfort will improve and/or be controlled Outcome: Progressing

## 2024-06-01 DIAGNOSIS — Z7189 Other specified counseling: Secondary | ICD-10-CM | POA: Diagnosis not present

## 2024-06-01 DIAGNOSIS — N824 Other female intestinal-genital tract fistulae: Secondary | ICD-10-CM | POA: Diagnosis not present

## 2024-06-01 DIAGNOSIS — A0472 Enterocolitis due to Clostridium difficile, not specified as recurrent: Secondary | ICD-10-CM | POA: Diagnosis not present

## 2024-06-01 DIAGNOSIS — Z515 Encounter for palliative care: Secondary | ICD-10-CM | POA: Diagnosis not present

## 2024-06-01 LAB — CBC WITH DIFFERENTIAL/PLATELET
Abs Immature Granulocytes: 0.13 K/uL — ABNORMAL HIGH (ref 0.00–0.07)
Basophils Absolute: 0.1 K/uL (ref 0.0–0.1)
Basophils Relative: 1 %
Eosinophils Absolute: 0.1 K/uL (ref 0.0–0.5)
Eosinophils Relative: 1 %
HCT: 22.4 % — ABNORMAL LOW (ref 36.0–46.0)
Hemoglobin: 6.9 g/dL — CL (ref 12.0–15.0)
Immature Granulocytes: 1 %
Lymphocytes Relative: 4 %
Lymphs Abs: 0.5 K/uL — ABNORMAL LOW (ref 0.7–4.0)
MCH: 28.8 pg (ref 26.0–34.0)
MCHC: 30.8 g/dL (ref 30.0–36.0)
MCV: 93.3 fL (ref 80.0–100.0)
Monocytes Absolute: 1 K/uL (ref 0.1–1.0)
Monocytes Relative: 7 %
Neutro Abs: 11.5 K/uL — ABNORMAL HIGH (ref 1.7–7.7)
Neutrophils Relative %: 86 %
Platelets: 235 K/uL (ref 150–400)
RBC: 2.4 MIL/uL — ABNORMAL LOW (ref 3.87–5.11)
RDW: 21.8 % — ABNORMAL HIGH (ref 11.5–15.5)
WBC: 13.3 K/uL — ABNORMAL HIGH (ref 4.0–10.5)
nRBC: 0 % (ref 0.0–0.2)

## 2024-06-01 LAB — COMPREHENSIVE METABOLIC PANEL WITH GFR
ALT: 10 U/L (ref 0–44)
AST: 15 U/L (ref 15–41)
Albumin: <1.5 g/dL — ABNORMAL LOW (ref 3.5–5.0)
Alkaline Phosphatase: 69 U/L (ref 38–126)
Anion gap: 13 (ref 5–15)
BUN: 24 mg/dL — ABNORMAL HIGH (ref 8–23)
CO2: 28 mmol/L (ref 22–32)
Calcium: 7.7 mg/dL — ABNORMAL LOW (ref 8.9–10.3)
Chloride: 95 mmol/L — ABNORMAL LOW (ref 98–111)
Creatinine, Ser: 2.9 mg/dL — ABNORMAL HIGH (ref 0.44–1.00)
GFR, Estimated: 17 mL/min — ABNORMAL LOW (ref >60)
Glucose, Bld: 132 mg/dL — ABNORMAL HIGH (ref 70–99)
Potassium: 4.3 mmol/L (ref 3.5–5.1)
Sodium: 136 mmol/L (ref 135–145)
Total Bilirubin: 0.4 mg/dL (ref 0.0–1.2)
Total Protein: 5.3 g/dL — ABNORMAL LOW (ref 6.5–8.1)

## 2024-06-01 LAB — GLUCOSE, CAPILLARY
Glucose-Capillary: 142 mg/dL — ABNORMAL HIGH (ref 70–99)
Glucose-Capillary: 101 mg/dL — ABNORMAL HIGH (ref 70–99)
Glucose-Capillary: 74 mg/dL (ref 70–99)
Glucose-Capillary: 73 mg/dL (ref 70–99)
Glucose-Capillary: 90 mg/dL (ref 70–99)

## 2024-06-01 LAB — MAGNESIUM: Magnesium: 1.8 mg/dL (ref 1.7–2.4)

## 2024-06-01 LAB — HEMOGLOBIN AND HEMATOCRIT, BLOOD
HCT: 28.4 % — ABNORMAL LOW (ref 36.0–46.0)
Hemoglobin: 9.1 g/dL — ABNORMAL LOW (ref 12.0–15.0)

## 2024-06-01 LAB — PREPARE RBC (CROSSMATCH)

## 2024-06-01 MED ORDER — SODIUM CHLORIDE 0.9% IV SOLUTION: Freq: Once | INTRAVENOUS | Status: AC

## 2024-06-01 NOTE — Progress Notes (Signed)
 Daily Progress Note   Date: 06/01/2024   Patient Name: Tracy Jennings  DOB: 1956/06/24  MRN: [id number]  Age / Sex: 68 y.o., female  Attending Physician: Tobie Yetta HERO, MD Primary Care Physician: Zachary Idelia LABOR, MD Admit Date: 05/16/2024 Length of Stay: 16 days  Reason for Follow-up: Establishing goals of care  Past Medical History:  Diagnosis Date   Anemia 04/2018   low iron. to be started on supplements   Cervical cancer (HCC)    CKD (chronic kidney disease)    Stage IV   Complication of anesthesia    receceived too much anesthesia, that she was in coma for a couple days    COVID-19 virus detected 10/28/2019   Diabetes mellitus without complication (HCC)    type II   ESRD (end stage renal disease) (HCC)    Heart murmur    followed as a child only   HSIL (high grade squamous intraepithelial lesion) on Pap smear of cervix    Hyperlipidemia associated with type 2 diabetes mellitus (HCC)    Hypertension    Pancreatitis    Peripheral vascular disease (HCC)     Subjective:   Subjective: Chart Reviewed. Updates received. Patient Assessed. Created space and opportunity for patient  and family to explore thoughts and feelings regarding current medical situation.  Today's Discussion: Today before meeting with the patient/family, I reviewed the chart notes including nursing note from yesterday, nephrology note from yesterday, internal medicine note from yesterday, nurse note from today, nephrology note from today. I also reviewed vital signs, nursing flowsheets, medication administrations record, labs, and imaging.   Today I saw the patient at bedside, no family was present.  Initially when I entered the room the patient was asleep but appeared to be in an uncomfortable position with her gown pulled up and she appeared to have sold herself.  I obtained assistance from the patient's CNA.  I worked the patient up and asked if she was comfortable laying like that she said yes, that  is how I sleep.  We noted that she needed to be cleaned up and the CNA was getting prepped for this.  She denies pain, nausea, vomiting.  She states her appetite has been good and she has been eating.  Her brother was here earlier today.  We discussed that she is improving and they are hoping for rehab placement.  I indicated that because of her improvement and clear goals at this time, palliative medicine would back off but be available for any needs in the future.  At that point I excused myself in the room to allow the bedside staff to provide personal care.  I provided emotional and general support through therapeutic listening, empathy, sharing of stories, therapeutic touch, and other techniques. I answered all questions and addressed all concerns to the best of my ability.  Review of Systems  Constitutional:        Denies pain in general  Respiratory:  Negative for shortness of breath.   Gastrointestinal:  Negative for abdominal pain, nausea and vomiting.    Objective:   Primary Diagnoses: Present on Admission:  Dementia with behavioral disturbance (HCC)  Chronic recurrent pancreatitis (HCC)  ESRD (end stage renal disease) (HCC)  Type 2 diabetes mellitus with other specified complication (HCC)  Essential hypertension  Cervical cancer (HCC)  C. difficile colitis  PVD (peripheral vascular disease) (HCC)  Fistula   Vital Signs:  BP (!) 140/53   Pulse 69   Temp 98.4 F (  36.9 C) (Oral)   Resp 16   Ht 5' 3 (1.6 m)   Wt 53.1 kg   LMP  (LMP Unknown)   SpO2 92%   BMI 20.74 kg/m   Physical Exam Vitals and nursing note reviewed.  Constitutional:      General: She is sleeping. She is not in acute distress.    Appearance: She is ill-appearing. She is not toxic-appearing.  HENT:     Head: Normocephalic and atraumatic.  Cardiovascular:     Rate and Rhythm: Normal rate.  Pulmonary:     Effort: Pulmonary effort is normal. No respiratory distress.  Abdominal:     General:  Abdomen is flat. There is no distension.  Skin:    General: Skin is warm and dry.  Neurological:     General: No focal deficit present.     Mental Status: She is easily aroused.  Psychiatric:        Mood and Affect: Mood normal.        Behavior: Behavior normal.     Palliative Assessment/Data: 40-50%   Assessment & Plan:   HPI/Patient Profile:  68 y.o. female  with past medical history of cognitive impairment, ESRD on HD MWF, cervical cancer, HTN, HLD, anemia and recent hospitalizations from 6/24-7/22 for hypertensive urgency and delirium, and 8/17-8/24 for C. difficile colitis for which she was treated with p.o. vancomycin   admitted on 05/16/2024 with weakness, nausea, diarrhea and abdominal discomfort. CT abdomen and pelvis showed circumferential wall thickening of rectosigmoid colon suspicious for colitis, Colo uterine fistula, increased bilateral pleural effusions, left > right and area of rounded density in anterior LLL measuring 3.1 cm, and progressive anasarca.  General surgery at [facility] was consulted and recommended higher level of care and the ER physician discussed with Dr. Dasie general surgeon at [facility] who agreed to see patient in consult. Patient was started on Dificid  and Flagyl  for C. difficile colitis.  Evaluated by general surgery - urgent surgical intervention is not indicated. PMT consulted to discuss GOC.   SUMMARY OF RECOMMENDATIONS   Full code Full scope of care Patient appears to be improving and now pending SNF placement Goals are clear Palliative medicine will back off at this time Please notify us  of any significant change or new needs  Symptom Management:  Per primary team Palliative medicine is available to assist as needed  Code Status: Full Code  Prognosis: Unable to determine  Discharge Planning: SNF/rehab  Discussed with: Patient, medical team, nursing team  Thank you for allowing us  to participate in the care of Tracy Jennings PMT will  continue to support holistically.  Billing based on MDM: Moderate  Detailed review of medical records (labs, imaging, vital signs), medically appropriate exam, discussed with treatment team, counseling and education to patient, family, & staff, documenting clinical information, medication management, coordination of care  [name], NP Palliative Medicine Team  Team Phone # [phone] (Nights/Weekends)  05/11/2021, 8:17 AM

## 2024-06-01 NOTE — Progress Notes (Signed)
 [facility] Progress Note   Subjective:   Seen in room. Transfusion in progress for Hgb 6.9. Answering questions.   Objective Vitals:   06/01/24 0600 06/01/24 0821 06/01/24 0932 06/01/24 0947  BP: (!) 113/44 (!) 154/59 (!) 156/57 (!) 143/53  Pulse: 66 67 68 68  Resp: 16 16 16 16   Temp: 98.8 F (37.1 C) 98.5 F (36.9 C) 98.3 F (36.8 C) 98.1 F (36.7 C)  TempSrc: Oral Oral Oral Oral  SpO2: 95% 100% 98% 97%  Weight:      Height:       Physical Exam General: Frail woman, NAD. Room air Heart: RRR Lungs: CTAB Extremities: Trace LE edema Dialysis Access: L AVG  Additional Objective Labs: Basic Metabolic Panel: Recent Labs  Lab 05/26/24 0457 05/27/24 0221 05/29/24 0813 05/30/24 0314 05/31/24 0240 06/01/24 0322  NA  --    < > 133* 135 135 136  K 5.8*   < > 5.4* 4.4 4.8 4.3  CL  --    < > 91* 92* 95* 95*  CO2  --    < > 29 28 30 28   GLUCOSE  --    < > 97 232* 110* 132*  BUN  --    < > 45* 32* 39* 24*  CREATININE  --    < > 3.79* 2.99* 3.69* 2.90*  CALCIUM   --    < > 8.0* 7.8* 7.7* 7.7*  PHOS 2.5  --  3.8 3.5  --   --    < > = values in this interval not displayed.   Liver Function Tests: Recent Labs  Lab 05/30/24 0314 05/31/24 0240 06/01/24 0322  AST 17 16 15   ALT 12 12 10   ALKPHOS 68 84 69  BILITOT 0.7 0.4 0.4  PROT 5.4* 5.3* 5.3*  ALBUMIN <1.5* <1.5* <1.5*   CBC: Recent Labs  Lab 05/30/24 0314 05/30/24 1717 05/31/24 0240 05/31/24 1553 06/01/24 0322  WBC 14.9* 13.1* 11.9* 13.0* 13.3*  NEUTROABS 13.2*  --  10.3*  --  11.5*  HGB 7.8* 7.5* 7.1* 7.6* 6.9*  HCT 25.8* 24.3* 23.2* 25.2* 22.4*  MCV 92.8 91.7 91.7 92.0 93.3  PLT 232 252 222 257 235   Medications:   (feeding supplement) PROSource Plus  30 mL Oral BID BM   apixaban   5 mg Oral BID   B-complex with vitamin C  1 tablet Oral Daily   carvedilol   25 mg Oral BID WC   Chlorhexidine  Gluconate Cloth  6 each Topical Q0600   cloNIDine   0.3 mg Transdermal Weekly   darbepoetin  (ARANESP ) injection - DIALYSIS  100 mcg Subcutaneous Q Fri-1800   diltiazem   360 mg Oral Daily   famotidine   20 mg Oral Daily   ferrous sulfate   325 mg Oral Daily   Gerhardt's butt cream   Topical QID   insulin  aspart  0-5 Units Subcutaneous QHS   insulin  aspart  0-9 Units Subcutaneous TID WC   lactose free nutrition  237 mL Oral TID WC   losartan   50 mg Oral Daily   multivitamin  1 tablet Oral QHS   sodium chloride  flush  10-40 mL Intracatheter Q12H   thiamine   100 mg Oral Daily   vancomycin   125 mg Oral Daily    Dialysis Orders MWF [facility]  3.5hrs, 400/500, EDW 53kg, 2K/2.5Ca bath, AVG, heparin  6000 unit bolus + 400u/hr pump - Calcitriol  1.61mcg with HD - Sensipar 60mg  with HD - Micera 75mcg Q 4 weeks -  per records, dose just ordered (not given) - Binder: Calcium  Acetate 667mh TID with meals   Assessment/Plan: Colo-uterine fistula: per abd CT. Appears surgery has signed off and plans outpt elective management; ID recommended Augmentin . C-diff colitis: On PO Vancomycin , WBC remains high but improving. LUE edema: LUE duplex ordered by Hospitalist - no issue with LUE but acute non occlusive RIJ clot noted - started on eliquis .  No issues with AVG thus far. Monitor. ESRD: on HD MWF.  Next HD Monday HTN/volume: HTN on 5 BP agents, BP lower here - have stopped spironolactone . Min edema on exam. Anemia of ESRD: Hgb 6.9 --for 1 U pRBCs today.  continue Aranesp  q Friday. Last 100 mcg on 9/19.  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 Dementia Malnutrition: NG tube placed, TF started 9/10 - stopped 9/14 (exacerbated diarrhea), NG tube now out. Eating better-ish. Hyperkalemia: S/p 1 dose Lokelma  followed by HD on 9/15. GOC: Palliative consulted -  ongoing loose stools, hospice considered but now eating a little better, so family has elected to stay the course, full scope for now. Technically tolerates HD, but doubt it is contributing to  QOL. SNF placement pending.  Tracy Ronnald Acosta PA-C [facility] 06/01/2024,10:53 AM

## 2024-06-01 NOTE — TOC Progression Note (Signed)
 Transition of Care [facility]) - Progression Note    Patient Details  Name: Tracy Jennings MRN: [id number] Date of Birth: Jul 11, 1956  [facility]) CM/SW Contact  Isaiah Public, LCSWA Phone Number: 06/01/2024, 4:21 PM  Clinical Narrative:     CSW LVM with patients son [name]. CSW awaiting call back to follow up on patients SNF bed offers/SNF choice. CSW will continue to follow.  Expected Discharge Plan: Skilled Nursing Facility Barriers to Discharge: Continued Medical Work up, English as a second language teacher, SNF Pending bed offer               Expected Discharge Plan and Services       Living arrangements for the past 2 months: Single Family Home                                       Social Drivers of Health (SDOH) Interventions SDOH Screenings   Food Insecurity: No Food Insecurity (05/16/2024)  Recent Concern: Food Insecurity - Food Insecurity Present (04/24/2024)   Received from [facility]  Housing: Low Risk  (05/16/2024)  Transportation Needs: No Transportation Needs (05/16/2024)  Recent Concern: Transportation Needs - Unmet Transportation Needs (04/24/2024)   Received from [facility]  Utilities: Not At Risk (05/16/2024)  Financial Resource Strain: High Risk (04/24/2024)   Received from [facility]  Social Connections: Unknown (05/16/2024)  Tobacco Use: Low Risk  (05/16/2024)    Readmission Risk Interventions     No data to display

## 2024-06-01 NOTE — Progress Notes (Signed)
 [facility] Progress Note Patient: Tracy Jennings FMW:[id number] DOB: 01/06/1956  DOA: 05/16/2024 DOS: the patient was seen and examined on 06/01/2024  Brief Hospital Course: 68 year old F with PMH of cognitive impairment, ESRD on HD MWF, cervical cancer, HTN, HLD, anemia and recent hospitalizations from 6/24-7/22 for hypertensive urgency and delirium, and 8/17-8/24 for C. difficile colitis for which she was treated with p.o. vancomycin  brought back to [facility] from PCP office due to weakness, nausea, diarrhea and abdominal discomfort.  Was brought to [facility] for concern for Colo uterine fistula.  Assessment and Plan: C. difficile colitis ID was consulted. Was initially on oral vancomycin . Later on was on Dificid  completed for 10 days. Now back on oral vancomycin  for 7 more days.  EOT 9/24. Diarrhea appears to be improving starting 9/18.   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 Had significant abdominal pain.  Currently pain resolved. General surgery was consulted and recommend conservative management of treatment of C. difficile as well as outpatient follow-up. No indication for inpatient surgery per surgery. Tolerating oral diet.  So far no diarrhea.   Persistent leukocytosis/bandemia:  Improving.  Loculated left pleural effusion/masslike consolidation:  Seems chronic.  Has no respiratory symptoms.  IR consulted for thoracocentesis but pleural effusion felt to be small to tap safely.  Treated with Augmentin . Repeat chest x-ray in 4 to 6 weeks.  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 Currently on sliding scale insulin .   Dementia without behavioral disturbance:  Acute delirium. Appears to be confused. Minimizing pain medication. Oriented to self and family.  Failure to thrive/poor appetite/unintentional weight loss: Significant weight loss. Hopefully oral intake will improve. Will  monitor.   Uncontrolled hypertension:  Improved  Continue home clonidine  patch, losartan , Coreg , hydralazine  and Aldactone  Cardizem  increased from 180 mg daily to 360 mg daily on 9/5 Ultrafiltration per nephrology. Continue monitoring   Hyperkalemia Monitor.   Acute right IJ DVT:  Incidental finding.  Venous Doppler was ordered for left forearm swelling but showed acute right IJ DVT. Started on Eliquis    ESRD on HD MWF Nephrology following. Appreciate assistance. Continue hemodialysis as scheduled.  Anemia of renal disease:  Hemoglobin stable at around 7 right now. Folic acid  was low but within normal range. B12 also within low normal range. Will initiate supplementation. Received PRBC on 9/12.  Repeat PRBC ordered on 9/28 due to hemoglobin dropping down below 7. Follow-up H&H adequate. Will monitor.   Physical deconditioning Therapy recommended SNF.  Seems family is in agreement now   History of uterine cancer: Outpatient follow-up.   Severe malnutrition/failure to thrive/hypoalbuminemia Will add Nepro.  Goals of care Palliative care consulted for goals of care.  For now holding off on further conversation.  Monitor for improvement.   Subjective: No acute complaint.  No nausea no vomiting.  Abdominal pain resolved.  Physical Exam: Clear to auscultation Bowel sound present.  Nontender. No edema. No bleeding reported by staff.  Data Reviewed: I have Reviewed nursing notes, Vitals, and Lab results. Since last encounter, pertinent lab results CBC and BMP   . I have ordered test including CBC and BMP  .   Disposition: Status is: Inpatient Remains inpatient appropriate because: Monitor for stabilization of hemoglobin  Place and maintain sequential compression device Start: 05/17/24 1416 SCDs Start: 05/17/24 0000 apixaban  (ELIQUIS ) tablet 5 mg    Family Communication: Family at bedside Level of care: Telemetry Medical continue Vitals:  06/01/24 0932  06/01/24 0947 06/01/24 1230 06/01/24 1548  BP: (!) 156/57 (!) 143/53 (!) 140/53 (!) 158/58  Pulse: 68 68 69 71  Resp: 16 16 16 16   Temp: 98.3 F (36.8 C) 98.1 F (36.7 C) 98.4 F (36.9 C) (!) 97.5 F (36.4 C)  TempSrc: Oral Oral Oral   SpO2: 98% 97% 92% 95%  Weight:      Height:         Author: Yetta Blanch, MD 06/01/2024 5:54 PM  Please look on [email or web address] to find out who is on call.

## 2024-06-01 NOTE — Plan of Care (Signed)
  Problem: Pain Managment: Goal: General experience of comfort will improve and/or be controlled Outcome: Progressing   Problem: Metabolic: Goal: Ability to maintain appropriate glucose levels will improve Outcome: Progressing   Problem: Nutritional: Goal: Maintenance of adequate nutrition will improve Outcome: Progressing

## 2024-06-02 DIAGNOSIS — N824 Other female intestinal-genital tract fistulae: Secondary | ICD-10-CM | POA: Diagnosis not present

## 2024-06-02 LAB — CBC WITH DIFFERENTIAL/PLATELET
Abs Immature Granulocytes: 0.11 K/uL — ABNORMAL HIGH (ref 0.00–0.07)
Basophils Absolute: 0.1 K/uL (ref 0.0–0.1)
Basophils Relative: 1 %
Eosinophils Absolute: 0.1 K/uL (ref 0.0–0.5)
Eosinophils Relative: 1 %
HCT: 28.4 % — ABNORMAL LOW (ref 36.0–46.0)
Hemoglobin: 9 g/dL — ABNORMAL LOW (ref 12.0–15.0)
Immature Granulocytes: 1 %
Lymphocytes Relative: 4 %
Lymphs Abs: 0.5 K/uL — ABNORMAL LOW (ref 0.7–4.0)
MCH: 28.7 pg (ref 26.0–34.0)
MCHC: 31.7 g/dL (ref 30.0–36.0)
MCV: 90.4 fL (ref 80.0–100.0)
Monocytes Absolute: 1 K/uL (ref 0.1–1.0)
Monocytes Relative: 8 %
Neutro Abs: 11.2 K/uL — ABNORMAL HIGH (ref 1.7–7.7)
Neutrophils Relative %: 85 %
Platelets: 240 K/uL (ref 150–400)
RBC: 3.14 MIL/uL — ABNORMAL LOW (ref 3.87–5.11)
RDW: 20.9 % — ABNORMAL HIGH (ref 11.5–15.5)
WBC: 12.9 K/uL — ABNORMAL HIGH (ref 4.0–10.5)
nRBC: 0 % (ref 0.0–0.2)

## 2024-06-02 LAB — COMPREHENSIVE METABOLIC PANEL WITH GFR
ALT: 10 U/L (ref 0–44)
AST: 13 U/L — ABNORMAL LOW (ref 15–41)
Albumin: <1.5 g/dL — ABNORMAL LOW (ref 3.5–5.0)
Alkaline Phosphatase: 63 U/L (ref 38–126)
Anion gap: 12 (ref 5–15)
BUN: 35 mg/dL — ABNORMAL HIGH (ref 8–23)
CO2: 28 mmol/L (ref 22–32)
Calcium: 7.6 mg/dL — ABNORMAL LOW (ref 8.9–10.3)
Chloride: 95 mmol/L — ABNORMAL LOW (ref 98–111)
Creatinine, Ser: 3.63 mg/dL — ABNORMAL HIGH (ref 0.44–1.00)
GFR, Estimated: 13 mL/min — ABNORMAL LOW (ref >60)
Glucose, Bld: 107 mg/dL — ABNORMAL HIGH (ref 70–99)
Potassium: 5 mmol/L (ref 3.5–5.1)
Sodium: 135 mmol/L (ref 135–145)
Total Bilirubin: 0.7 mg/dL (ref 0.0–1.2)
Total Protein: 5.5 g/dL — ABNORMAL LOW (ref 6.5–8.1)

## 2024-06-02 LAB — TYPE AND SCREEN
ABO/RH(D): A POS
Antibody Screen: NEGATIVE
Unit division: 0

## 2024-06-02 LAB — GLUCOSE, CAPILLARY
Glucose-Capillary: 94 mg/dL (ref 70–99)
Glucose-Capillary: 80 mg/dL (ref 70–99)
Glucose-Capillary: 79 mg/dL (ref 70–99)
Glucose-Capillary: 93 mg/dL (ref 70–99)

## 2024-06-02 LAB — BPAM RBC
Blood Product Expiration Date: 202510122359
ISSUE DATE / TIME: 202509200913
Unit Type and Rh: 6200

## 2024-06-02 LAB — MAGNESIUM: Magnesium: 1.9 mg/dL (ref 1.7–2.4)

## 2024-06-02 MED ORDER — APIXABAN 5 MG PO TABS
5.0000 mg | ORAL_TABLET | Freq: Two times a day (BID) | ORAL | Status: DC
  Administered 2024-06-02 – 2024-06-10 (×17): 5 mg via ORAL
  Filled 2024-06-02 (×16): qty 1

## 2024-06-02 NOTE — TOC Progression Note (Signed)
 Transition of Care [facility]) - Progression Note    Patient Details  Name: Tracy Jennings MRN: [id number] Date of Birth: [birth year]  Transition of Care [facility]) CM/SW Contact  Yisell Sprunger E Jakalyn Kratky, LCSW Phone Number: 06/02/2024, 10:23 AM  Clinical Narrative:    Attempted follow-up call to patient's son [name], left VM requesting return call.    Expected Discharge Plan: Skilled Nursing Facility Barriers to Discharge: Continued Medical Work up, English as a second language teacher, SNF Pending bed offer               Expected Discharge Plan and Services       Living arrangements for the past 2 months: Single Family Home                                       Social Drivers of Health (SDOH) Interventions SDOH Screenings   Food Insecurity: No Food Insecurity (05/16/2024)  Recent Concern: Food Insecurity - Food Insecurity Present (04/24/2024)   Received from [facility]  Housing: Low Risk  (05/16/2024)  Transportation Needs: No Transportation Needs (05/16/2024)  Recent Concern: Transportation Needs - Unmet Transportation Needs (04/24/2024)   Received from [facility]  Utilities: Not At Risk (05/16/2024)  Financial Resource Strain: High Risk (04/24/2024)   Received from [facility]  Social Connections: Unknown (05/16/2024)  Tobacco Use: Low Risk  (05/16/2024)    Readmission Risk Interventions     No data to display

## 2024-06-02 NOTE — Progress Notes (Signed)
 [facility] Progress Note   Subjective:   Seen in room. Seems more confused this morning. Says she's bleeding but no signs of bleeding on exam. Seen by palliative care yesterday - continue full scope of care    Objective Vitals:   06/01/24 1548 06/01/24 1939 06/02/24 0500 06/02/24 0804  BP: (!) 158/58 (!) 154/62 (!) 158/47 (!) 153/58  Pulse: 71 68 63 68  Resp: 16 17 16 18   Temp: (!) 97.5 F (36.4 C) 98.2 F (36.8 C) 98.6 F (37 C) 98.6 F (37 C)  TempSrc:  Oral Oral Oral  SpO2: 95% 93% 98% 93%  Weight:      Height:       Physical Exam General: Frail woman, NAD. Room air Heart: RRR Lungs: CTAB Extremities: No  LE edema Dialysis Access: L AVG  Additional Objective Labs: Basic Metabolic Panel: Recent Labs  Lab 05/29/24 0813 05/30/24 0314 05/31/24 0240 06/01/24 0322 06/02/24 0433  NA 133* 135 135 136 135  K 5.4* 4.4 4.8 4.3 5.0  CL 91* 92* 95* 95* 95*  CO2 29 28 30 28 28   GLUCOSE 97 232* 110* 132* 107*  BUN 45* 32* 39* 24* 35*  CREATININE 3.79* 2.99* 3.69* 2.90* 3.63*  CALCIUM  8.0* 7.8* 7.7* 7.7* 7.6*  PHOS 3.8 3.5  --   --   --    Liver Function Tests: Recent Labs  Lab 05/31/24 0240 06/01/24 0322 06/02/24 0433  AST 16 15 13*  ALT 12 10 10   ALKPHOS 84 69 63  BILITOT 0.4 0.4 0.7  PROT 5.3* 5.3* 5.5*  ALBUMIN <1.5* <1.5* <1.5*   CBC: Recent Labs  Lab 05/30/24 1717 05/31/24 0240 05/31/24 1553 06/01/24 0322 06/01/24 1636 06/02/24 0433  WBC 13.1* 11.9* 13.0* 13.3*  --  12.9*  NEUTROABS  --  10.3*  --  11.5*  --  11.2*  HGB 7.5* 7.1* 7.6* 6.9* 9.1* 9.0*  HCT 24.3* 23.2* 25.2* 22.4* 28.4* 28.4*  MCV 91.7 91.7 92.0 93.3  --  90.4  PLT 252 222 257 235  --  240   Medications:   (feeding supplement) PROSource Plus  30 mL Oral BID BM   apixaban   5 mg Oral BID   B-complex with vitamin C  1 tablet Oral Daily   carvedilol   25 mg Oral BID WC   Chlorhexidine  Gluconate Cloth  6 each Topical Q0600   cloNIDine   0.3 mg Transdermal Weekly    darbepoetin (ARANESP ) injection - DIALYSIS  100 mcg Subcutaneous Q Fri-1800   diltiazem   360 mg Oral Daily   famotidine   20 mg Oral Daily   ferrous sulfate   325 mg Oral Daily   Gerhardt's butt cream   Topical QID   insulin  aspart  0-5 Units Subcutaneous QHS   insulin  aspart  0-9 Units Subcutaneous TID WC   lactose free nutrition  237 mL Oral TID WC   losartan   50 mg Oral Daily   multivitamin  1 tablet Oral QHS   sodium chloride  flush  10-40 mL Intracatheter Q12H   thiamine   100 mg Oral Daily   vancomycin   125 mg Oral Daily    Dialysis Orders MWF [facility]  3.5hrs, 400/500, EDW 53kg, 2K/2.5Ca bath, AVG, heparin  6000 unit bolus + 400u/hr pump - Calcitriol  1.64mcg with HD - Sensipar 60mg  with HD - Micera 75mcg Q 4 weeks - per records, dose just ordered (not given) - Binder: Calcium  Acetate 667mh TID with meals   Assessment/Plan: Colo-uterine fistula: per abd  CT. Appears surgery has signed off and plans outpt elective management; ID recommended Augmentin . C-diff colitis: On PO Vancomycin , WBC remains high but improving. LUE edema: LUE duplex ordered by Hospitalist - no issue with LUE but acute non occlusive RIJ clot noted - started on eliquis .  No issues with AVG thus far. Monitor. ESRD: on HD MWF.  Next HD Monday HTN/volume: HTN on 5 BP agents, BP lower here - have stopped spironolactone . Min edema on exam. 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   continue Aranesp  q Friday. Last 100 mcg on 9/19.  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 Dementia Malnutrition: NG tube placed, TF started 9/10 - stopped 9/14 (exacerbated diarrhea), NG tube now out. Eating better-ish. Hyperkalemia: S/p 1 dose Lokelma  followed by HD on 9/15. GOC: Palliative consulted -  ongoing loose stools, hospice considered but now eating a little better, so family has elected to stay the course, full scope for now. Technically tolerates HD, but doubt it is  contributing to QOL. SNF placement pending.  Maisie Ronnald Acosta PA-C [facility] 06/02/2024,11:10 AM

## 2024-06-02 NOTE — Progress Notes (Signed)
 [facility] Progress Note Patient: Tracy Jennings FMW:[id number] DOB: Dec 15, 1955  DOA: 05/16/2024 DOS: the patient was seen and examined on 06/02/2024  Brief Hospital Course: 68 year old F with PMH of cognitive impairment, ESRD on HD MWF, cervical cancer, HTN, HLD, anemia and recent hospitalizations from 6/24-7/22 for hypertensive urgency and delirium, and 8/17-8/24 for C. difficile colitis for which she was treated with p.o. vancomycin  brought back to [facility] from PCP office due to weakness, nausea, diarrhea and abdominal discomfort.  Was brought to [facility] for concern for Colo uterine fistula.  Assessment and Plan: C. difficile colitis ID was consulted. Was initially on oral vancomycin . Later on was on Dificid  completed for 10 days. Now back on oral vancomycin  for 7 more days.  EOT 9/24. Diarrhea appears to be improving starting 9/18.   Colouterine fistula reason for transfer from [facility] to [facility]. Had significant abdominal pain.  Currently pain resolved. General surgery was consulted and recommend conservative management of treatment of C. difficile as well as outpatient follow-up. No indication for inpatient surgery per surgery. Tolerating oral diet.  So far no diarrhea.   Persistent leukocytosis/bandemia:  Improving.  Loculated left pleural effusion/masslike consolidation:  Seems chronic.  Has no respiratory symptoms.  IR consulted for thoracocentesis but pleural effusion felt to be small to tap safely.  Treated with Augmentin . Repeat chest x-ray in 4 to 6 weeks.  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 Currently on sliding scale insulin .   Dementia without behavioral disturbance:  Acute delirium. Appears to be confused. Minimizing pain medication. Oriented to self and family.  Failure to thrive/poor appetite/unintentional weight loss: Significant weight loss. Hopefully oral intake will improve. Will  monitor.   Uncontrolled hypertension:  Improved  Continue home clonidine  patch, losartan , Coreg , hydralazine  and Aldactone  Cardizem  increased from 180 mg daily to 360 mg daily on 9/5 Ultrafiltration per nephrology. Continue monitoring   Hyperkalemia Monitor.   Acute right IJ DVT:  Incidental finding.  Venous Doppler was ordered for left forearm swelling but showed acute right IJ DVT. Started on Eliquis    ESRD on HD MWF Nephrology following. Appreciate assistance. Continue hemodialysis as scheduled.  Anemia of renal disease:  Hemoglobin stable at around 7 right now. Folic acid  was low but within normal range. B12 also within low normal range. Will initiate supplementation. Received PRBC on 9/12.  Repeat PRBC ordered on 9/28 due to hemoglobin dropping down below 7. Follow-up H&H adequate. Will monitor.   Physical deconditioning Therapy recommended SNF.  Seems family is in agreement now   History of uterine cancer: Outpatient follow-up.   Severe malnutrition/failure to thrive/hypoalbuminemia Will add Nepro.  Goals of care Palliative care consulted for goals of care.  For now holding off on further conversation.  Monitor for improvement.   Subjective: No nausea no vomiting no fever no chills.  Was hungry but looks like requires assistance for feeding.  Physical Exam: Clear to auscultation. S1-S2 present Bowel sound present Aortic systolic murmur. No edema.  Data Reviewed: I have Reviewed nursing notes, Vitals, and Lab results. Since last encounter, pertinent lab results CBC and BMP   . I have ordered test including BMP  .    Disposition: Status is: Inpatient Remains inpatient appropriate because: Awaiting placement.  Place and maintain sequential compression device Start: 05/17/24 1416 SCDs Start: 05/17/24 0000 apixaban  (ELIQUIS ) tablet 5 mg   Family Communication: No one bedside today Level of care: Telemetry Medical   Vitals:  06/01/24 1939 06/02/24  0500 06/02/24 0804 06/02/24 1455  BP: (!) 154/62 (!) 158/47 (!) 153/58 (!) 176/57  Pulse: 68 63 68 76  Resp: 17 16 18 19   Temp: 98.2 F (36.8 C) 98.6 F (37 C) 98.6 F (37 C) 98.6 F (37 C)  TempSrc: Oral Oral Oral Oral  SpO2: 93% 98% 93% 96%  Weight:      Height:         Author: Yetta Blanch, MD 06/02/2024 7:27 PM  Please look on [email or web address] to find out who is on call.

## 2024-06-02 NOTE — Plan of Care (Signed)
  Problem: Nutrition: Goal: Adequate nutrition will be maintained Outcome: Progressing   Problem: Pain Managment: Goal: General experience of comfort will improve and/or be controlled Outcome: Progressing

## 2024-06-03 DIAGNOSIS — Z7189 Other specified counseling: Secondary | ICD-10-CM | POA: Diagnosis not present

## 2024-06-03 DIAGNOSIS — N824 Other female intestinal-genital tract fistulae: Secondary | ICD-10-CM | POA: Diagnosis not present

## 2024-06-03 DIAGNOSIS — Z515 Encounter for palliative care: Secondary | ICD-10-CM | POA: Diagnosis not present

## 2024-06-03 DIAGNOSIS — A0472 Enterocolitis due to Clostridium difficile, not specified as recurrent: Secondary | ICD-10-CM | POA: Diagnosis not present

## 2024-06-03 LAB — COMPREHENSIVE METABOLIC PANEL WITH GFR
ALT: 10 U/L (ref 0–44)
AST: 13 U/L — ABNORMAL LOW (ref 15–41)
Albumin: 1.5 g/dL — ABNORMAL LOW (ref 3.5–5.0)
Alkaline Phosphatase: 59 U/L (ref 38–126)
Anion gap: 11 (ref 5–15)
BUN: 44 mg/dL — ABNORMAL HIGH (ref 8–23)
CO2: 27 mmol/L (ref 22–32)
Calcium: 7.7 mg/dL — ABNORMAL LOW (ref 8.9–10.3)
Chloride: 96 mmol/L — ABNORMAL LOW (ref 98–111)
Creatinine, Ser: 4.58 mg/dL — ABNORMAL HIGH (ref 0.44–1.00)
GFR, Estimated: 10 mL/min — ABNORMAL LOW (ref >60)
Glucose, Bld: 107 mg/dL — ABNORMAL HIGH (ref 70–99)
Potassium: 5.3 mmol/L — ABNORMAL HIGH (ref 3.5–5.1)
Sodium: 134 mmol/L — ABNORMAL LOW (ref 135–145)
Total Bilirubin: 0.7 mg/dL (ref 0.0–1.2)
Total Protein: 5.3 g/dL — ABNORMAL LOW (ref 6.5–8.1)

## 2024-06-03 LAB — CBC WITH DIFFERENTIAL/PLATELET
Abs Immature Granulocytes: 0.09 K/uL — ABNORMAL HIGH (ref 0.00–0.07)
Basophils Absolute: 0.1 K/uL (ref 0.0–0.1)
Basophils Relative: 1 %
Eosinophils Absolute: 0.2 K/uL (ref 0.0–0.5)
Eosinophils Relative: 1 %
HCT: 28.5 % — ABNORMAL LOW (ref 36.0–46.0)
Hemoglobin: 8.8 g/dL — ABNORMAL LOW (ref 12.0–15.0)
Immature Granulocytes: 1 %
Lymphocytes Relative: 4 %
Lymphs Abs: 0.4 K/uL — ABNORMAL LOW (ref 0.7–4.0)
MCH: 28.4 pg (ref 26.0–34.0)
MCHC: 30.9 g/dL (ref 30.0–36.0)
MCV: 91.9 fL (ref 80.0–100.0)
Monocytes Absolute: 1 K/uL (ref 0.1–1.0)
Monocytes Relative: 8 %
Neutro Abs: 9.8 K/uL — ABNORMAL HIGH (ref 1.7–7.7)
Neutrophils Relative %: 85 %
Platelets: 241 K/uL (ref 150–400)
RBC: 3.1 MIL/uL — ABNORMAL LOW (ref 3.87–5.11)
RDW: 20.6 % — ABNORMAL HIGH (ref 11.5–15.5)
Smear Review: NORMAL
WBC: 11.5 K/uL — ABNORMAL HIGH (ref 4.0–10.5)
nRBC: 0 % (ref 0.0–0.2)

## 2024-06-03 LAB — GLUCOSE, CAPILLARY
Glucose-Capillary: 85 mg/dL (ref 70–99)
Glucose-Capillary: 114 mg/dL — ABNORMAL HIGH (ref 70–99)
Glucose-Capillary: 117 mg/dL — ABNORMAL HIGH (ref 70–99)

## 2024-06-03 LAB — MAGNESIUM: Magnesium: 2.1 mg/dL (ref 1.7–2.4)

## 2024-06-03 MED ORDER — LIDOCAINE-PRILOCAINE 2.5-2.5 % EX CREA: 1.0000 | TOPICAL_CREAM | CUTANEOUS | Status: DC | PRN

## 2024-06-03 MED ORDER — ANTICOAGULANT SODIUM CITRATE 4% (200MG/5ML) IV SOLN: 5.0000 mL | Status: DC | PRN

## 2024-06-03 MED ORDER — LIDOCAINE HCL (PF) 1 % IJ SOLN: 5.0000 mL | INTRAMUSCULAR | Status: DC | PRN

## 2024-06-03 MED ORDER — PENTAFLUOROPROP-TETRAFLUOROETH EX AERO: 1.0000 | INHALATION_SPRAY | CUTANEOUS | Status: DC | PRN

## 2024-06-03 MED ORDER — ALTEPLASE 2 MG IJ SOLR: 2.0000 mg | Freq: Once | INTRAMUSCULAR | Status: DC | PRN

## 2024-06-03 NOTE — Procedures (Signed)
 Patient seen on Hemodialysis. BP (!) 133/43   Pulse 69   Temp 98.3 F (36.8 C) (Oral)   Resp 12   Ht 5' 3 (1.6 m)   Wt 57.2 kg   LMP  (LMP Unknown)   SpO2 96%   BMI 22.34 kg/m   QB 400, UF goal 1.5L Tolerating treatment without complaints at this time- sleepy and somewhat confused.   Gordy Blanch MD [facility]. Office # [phone] Pager # [phone] 9:44 AM

## 2024-06-03 NOTE — Progress Notes (Signed)
 [facility] Progress Note Patient: Tracy Jennings FMW:[id number] DOB: 1956-08-20  DOA: 05/16/2024 DOS: the patient was seen and examined on 06/03/2024  Brief Hospital Course: 68 year old F with PMH of cognitive impairment, ESRD on HD MWF, cervical cancer, HTN, HLD, anemia and recent hospitalizations from 6/24-7/22 for hypertensive urgency and delirium, and 8/17-8/24 for C. difficile colitis for which she was treated with p.o. vancomycin  brought back to [facility] from PCP office due to weakness, nausea, diarrhea and abdominal discomfort.  Was brought to [facility] for concern for Colo uterine fistula. Now medically stable. Assessment and Plan: C. difficile colitis ID was consulted. Was initially on oral vancomycin . Later on was on Dificid  completed for 10 days. Now back on oral vancomycin  for 7 more days.  EOT 9/24. Diarrhea appears to be improving starting 9/18.   Colouterine fistula reason for transfer from [facility] to [facility]. Had significant abdominal pain.  Currently pain resolved. General surgery was consulted and recommend conservative management of treatment of C. difficile as well as outpatient follow-up. No indication for inpatient surgery per surgery. Tolerating oral diet.  So far no diarrhea.   Persistent leukocytosis/bandemia:  Improving.  Loculated left pleural effusion/masslike consolidation:  Seems chronic.  Has no respiratory symptoms.  IR consulted for thoracocentesis but pleural effusion felt to be small to tap safely.  Treated with Augmentin . Repeat chest x-ray in 4 to 6 weeks.  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 Currently on sliding scale insulin .   Dementia without behavioral disturbance:  Acute delirium. Appears to be confused. Minimizing pain medication. Oriented to self and family.  Failure to thrive/poor appetite/unintentional weight loss: Significant weight loss. Hopefully oral intake  will improve. Will monitor.   Uncontrolled hypertension:  Improved  Continue home clonidine  patch, losartan , Coreg , hydralazine  and Aldactone  Cardizem  increased from 180 mg daily to 360 mg daily on 9/5 Ultrafiltration per nephrology. Continue monitoring   Hyperkalemia Monitor.   Acute right IJ DVT:  Incidental finding.  Venous Doppler was ordered for left forearm swelling but showed acute right IJ DVT. Started on Eliquis    ESRD on HD MWF Nephrology following. Appreciate assistance. Continue hemodialysis as scheduled.  Anemia of renal disease:  Hemoglobin stable at around 7 right now. Folic acid  was low but within normal range. B12 also within low normal range. Will initiate supplementation. Received PRBC on 9/12.  Repeat PRBC ordered on 9/28  Follow-up H&H adequate. Will monitor.   Physical deconditioning Therapy recommended SNF.  Seems family is in agreement now   History of uterine cancer: Outpatient follow-up.   Severe malnutrition/failure to thrive/hypoalbuminemia Continue supplements.  Goals of care Palliative care consulted for goals of care.  For now holding off on further conversation.  Monitor for improvement.   Subjective: No vomiting.  Seen in dialysis.  Physical Exam: Clear to auscultation.  S1-S2 present. Bowel sound present Nontender.  Data Reviewed: I have Reviewed nursing notes, Vitals, and Lab results. Since last encounter, pertinent lab results CBC BMP   . I have ordered test including CBC BMP  .   Disposition: Status is: Inpatient Remains inpatient appropriate because: Medically stable.  Awaiting placement.  Place and maintain sequential compression device Start: 05/17/24 1416 SCDs Start: 05/17/24 0000 apixaban  (ELIQUIS ) tablet 5 mg    Family Communication: No one at bedside Level of care: Telemetry Medical   Vitals:   06/03/24 1130 06/03/24 1143 06/03/24 1145 06/03/24 1606  BP: (!) 180/49 (!) 170/53 (!) 189/52 ROLLEN)  159/63  Pulse:  70 71 73 79  Resp: 14 13 12 16   Temp:   98.3 F (36.8 C) 98.2 F (36.8 C)  TempSrc:    Oral  SpO2: 100% 97% 96% 100%  Weight:   55.5 kg   Height:         Author: Yetta Blanch, MD 06/03/2024 7:18 PM  Please look on [email or web address] to find out who is on call.

## 2024-06-03 NOTE — Progress Notes (Signed)
 Received patient in bed.Alert and oriented to person and place.  Access used: Left arm avf that worked well.  Duration of treatment : 3.25 hours.  UF : Met 1.5 L,tolerated treatment:  Hemo comment: Moved her bowel twice and cleaned by hd staff.  Hand off to the patient's nurse ,back into her room with stable condition via transporter.

## 2024-06-03 NOTE — TOC Progression Note (Addendum)
 Transition of Care [facility]) - Progression Note    Patient Details  Name: DAJANE VALLI MRN: [id number] Date of Birth: Jan 12, 1956  Transition of Care [facility]) CM/SW Contact  Inocente GORMAN Kindle, LCSW Phone Number: 06/03/2024, 1:03 PM  Clinical Narrative:    1:03 PM-CSW left voicemail  and sent HIPAA compliant text to patient's son to discuss SNF choice; requested call back.   2pm-Son texted back and stated Ok.   5:28 PM-Patient only has one bed offer in [employer], which she needs for her dialysis center location and transport ability. CSW followed up with [facility], which patient has recently been at in July, but they do not have any beds opening. CSW requested [facility] review.     Expected Discharge Plan: Skilled Nursing Facility Barriers to Discharge: Continued Medical Work up, English as a second language teacher, SNF Pending bed offer               Expected Discharge Plan and Services       Living arrangements for the past 2 months: Single Family Home                                       Social Drivers of Health (SDOH) Interventions SDOH Screenings   Food Insecurity: No Food Insecurity (05/16/2024)  Recent Concern: Food Insecurity - Food Insecurity Present (04/24/2024)   Received from [facility]  Housing: Low Risk  (05/16/2024)  Transportation Needs: No Transportation Needs (05/16/2024)  Recent Concern: Transportation Needs - Unmet Transportation Needs (04/24/2024)   Received from [facility]  Utilities: Not At Risk (05/16/2024)  Financial Resource Strain: High Risk (04/24/2024)   Received from [facility]  Social Connections: Unknown (05/16/2024)  Tobacco Use: Low Risk  (05/16/2024)    Readmission Risk Interventions     No data to display

## 2024-06-03 NOTE — Plan of Care (Signed)
  Problem: Pain Managment: Goal: General experience of comfort will improve and/or be controlled Outcome: Progressing   Problem: Nutritional: Goal: Maintenance of adequate nutrition will improve Outcome: Progressing

## 2024-06-04 DIAGNOSIS — Z515 Encounter for palliative care: Secondary | ICD-10-CM | POA: Diagnosis not present

## 2024-06-04 DIAGNOSIS — N824 Other female intestinal-genital tract fistulae: Secondary | ICD-10-CM | POA: Diagnosis not present

## 2024-06-04 DIAGNOSIS — A0472 Enterocolitis due to Clostridium difficile, not specified as recurrent: Secondary | ICD-10-CM | POA: Diagnosis not present

## 2024-06-04 DIAGNOSIS — Z7189 Other specified counseling: Secondary | ICD-10-CM | POA: Diagnosis not present

## 2024-06-04 LAB — GLUCOSE, CAPILLARY
Glucose-Capillary: 101 mg/dL — ABNORMAL HIGH (ref 70–99)
Glucose-Capillary: 107 mg/dL — ABNORMAL HIGH (ref 70–99)
Glucose-Capillary: 97 mg/dL (ref 70–99)
Glucose-Capillary: 87 mg/dL (ref 70–99)

## 2024-06-04 MED ORDER — ISOSORBIDE MONONITRATE ER 30 MG PO TB24
30.0000 mg | ORAL_TABLET | Freq: Every day | ORAL | Status: DC
Start: 2024-06-04 — End: 2024-06-14
  Administered 2024-06-04 – 2024-06-13 (×9): 30 mg via ORAL
  Filled 2024-06-04 (×9): qty 1

## 2024-06-04 MED ORDER — VANCOMYCIN HCL 125 MG PO CAPS
125.0000 mg | ORAL_CAPSULE | Freq: Every day | ORAL | Status: AC
  Administered 2024-06-04 – 2024-06-05 (×2): 125 mg via ORAL
  Filled 2024-06-04 (×2): qty 1

## 2024-06-04 NOTE — TOC Progression Note (Addendum)
 Transition of Care [facility]) - Progression Note    Patient Details  Name: [name] MRN: [id number] Date of Birth: [birth year]  Transition [facility] Care [facility]) CM/SW Contact  Inocente GORMAN Kindle, LCSW Phone Number: 06/04/2024, 11:49 AM  Clinical Narrative:    11:49am-No beds available at [facility]. CSW requested [facility] review.   3:14 PM-CSW's coworker received call from patient's son, [name], stating for CSW to contact patient's eldest brother, [name] Glatter ([phone]) for SNF decision. CSW left him a voicemail.   3:17 PM-Bernard returned the call. CSW made him aware that [facility] can accept patient. They checked patient's Medicare days (was at [facility] 7/22-8/12) and confirmed with her insurance plan that her out of pocket was met so will not have to pay co-payments. [name] reported agreement with plan as he is hoping [facility] will have better therapy than [facility] did.   CSW will initiate insurance process once therapy note is in for today.     Expected Discharge Plan: Skilled Nursing Facility Barriers to Discharge: Continued Medical Work up, English as a second language teacher, SNF Pending bed offer               Expected Discharge Plan and Services       Living arrangements for the past 2 months: Single Family Home                                       Social Drivers of Health (SDOH) Interventions SDOH Screenings   Food Insecurity: No Food Insecurity (05/16/2024)  Recent Concern: Food Insecurity - Food Insecurity Present (04/24/2024)   Received from [facility]  Housing: Low Risk  (05/16/2024)  Transportation Needs: No Transportation Needs (05/16/2024)  Recent Concern: Transportation Needs - Unmet Transportation Needs (04/24/2024)   Received from [facility]  Utilities: Not At Risk (05/16/2024)  Financial Resource Strain: High Risk (04/24/2024)   Received from [facility]  Social  Connections: Unknown (05/16/2024)  Tobacco Use: Low Risk  (05/16/2024)    Readmission Risk Interventions     No data to display

## 2024-06-04 NOTE — Progress Notes (Signed)
 Occupational Therapy Treatment Patient Details Name: Tracy Jennings MRN: [id number] DOB: 1956/09/01 Today's Date: 06/04/2024   History of present illness Patient is 68 yo female admitted on 05/16/24 for generalized weakness concerns for colouterine fistula. + C. Diff. PMH significant for dementia, HTN, DM, HLD, cervical cancer, recurrent pancreatitis, ESRD on HD.   OT comments  Pt seen for progress update with goals prolonged with two downgraded due to slow progression, limited participation in several sessions. Pt more willing for participation this session as compared to prior session; with good initiation of bed mobility after pericare to come to EOB. Engaged in therapeutic activity EOB with pt needing step by step cues to initiate and sustain tasks. Will continue to follow. Patient will benefit from continued inpatient follow up therapy, <3 hours/day       If plan is discharge home, recommend the following:  A lot of help with walking and/or transfers;A lot of help with bathing/dressing/bathroom;Assistance with cooking/housework;Assistance with feeding;Direct supervision/assist for medications management;Direct supervision/assist for financial management;Assist for transportation;Help with stairs or ramp for entrance;Supervision due to cognitive status   Equipment Recommendations  Other (comment) (defer)    Recommendations for Other Services      Precautions / Restrictions Precautions Precautions: Fall Recall of Precautions/Restrictions: Impaired Restrictions Weight Bearing Restrictions Per Provider Order: No       Mobility Bed Mobility Overal bed mobility: Needs Assistance Bed Mobility: Supine to Sit, Rolling Rolling: Min assist   Supine to sit: Min assist, +2 for physical assistance     General bed mobility comments: min A to roll R/L and maintain L sidelying, min A +2 to bring LEs to and off EOB and to elevate trunk to sitting. pt with good initiation this session     Transfers                   General transfer comment: pt declining     Balance Overall balance assessment: Needs assistance Sitting-balance support: Feet supported Sitting balance-Leahy Scale: Poor Sitting balance - Comments: R lateral and anterior flexion due to cervical pain                                   ADL either performed or assessed with clinical judgement   ADL Overall ADL's : Needs assistance/impaired     Grooming: Minimal assistance;Sitting;Wash/dry face Grooming Details (indicate cue type and reason): for sitting balance                   Toilet Transfer Details (indicate cue type and reason): pt deferred Toileting- Clothing Manipulation and Hygiene: Total assistance;Bed level              Extremity/Trunk Assessment Upper Extremity Assessment Upper Extremity Assessment: Generalized weakness;RUE deficits/detail;LUE deficits/detail RUE Deficits / Details: tightness in musculature surrounding scappula with shoulder mobility LUE Deficits / Details: tightness in musculature surrounding scappula with shoulder mobility   Lower Extremity Assessment Lower Extremity Assessment: Defer to PT evaluation        Vision   Additional Comments: continued swelling in face R>L around eyes   Perception     Praxis     Communication Communication Communication: No apparent difficulties   Cognition Arousal: Lethargic, Alert (increased alertness with transfer to sitting EOB) Behavior During Therapy: Flat affect Cognition: History of cognitive impairments             OT - Cognition Comments: follows one  step commands, needs cues to sustain tasks                 Following commands: Impaired Following commands impaired: Follows one step commands with increased time      Cueing   Cueing Techniques: Verbal cues, Gestural cues, Tactile cues, Visual cues  Exercises Exercises: Other exercises Other Exercises Other Exercises:  engaged in BUE AAROM shoulder flexion at EOB with reaching toward therapist hand; step by step cues to initiate and sustain task (look up, reach for my hand) with repeated commands Other Exercises: cervical flex/ext and rotation L and R with light guidance.    Shoulder Instructions       General Comments      Pertinent Vitals/ Pain       Pain Assessment Pain Assessment: Faces Faces Pain Scale: Hurts little more Pain Location: peri area with peri-care, head and neck Pain Descriptors / Indicators: Tender, Discomfort Pain Intervention(s): Limited activity within patient's tolerance, Monitored during session  Home Living                                          Prior Functioning/Environment              Frequency  Min 1X/week        Progress Toward Goals  OT Goals(current goals can now be found in the care plan section)  Progress towards OT goals: Progressing toward goals  Acute Rehab OT Goals Time For Goal Achievement: 06/18/24 Potential to Achieve Goals: Fair ADL Goals Pt Will Perform Lower Body Dressing: with mod assist;sitting/lateral leans Pt Will Transfer to Toilet: with mod assist;bedside commode  Plan      Co-evaluation                 AM-PAC OT 6 Clicks Daily Activity     Outcome Measure   Help from another person eating meals?: A Little Help from another person taking care of personal grooming?: A Lot Help from another person toileting, which includes using toliet, bedpan, or urinal?: Total Help from another person bathing (including washing, rinsing, drying)?: Total Help from another person to put on and taking off regular upper body clothing?: A Lot Help from another person to put on and taking off regular lower body clothing?: Total 6 Click Score: 10    End of Session    OT Visit Diagnosis: Unsteadiness on feet (R26.81);Other abnormalities of gait and mobility (R26.89);Repeated falls (R29.6);Muscle weakness  (generalized) (M62.81);Pain   Activity Tolerance Patient limited by fatigue;Patient limited by pain   Patient Left in bed;with call bell/phone within reach;with bed alarm set   Nurse Communication Mobility status        Time: 1450-1510 OT Time Calculation (min): 20 min  Charges: OT General Charges $OT Visit: 1 Visit OT Treatments $Therapeutic Activity: 8-22 mins  Elma JONETTA Lebron FREDERICK, OTR/L [facility] Acute Rehabilitation Office: [phone]   Elma JONETTA Lebron 06/04/2024, 5:02 PM

## 2024-06-04 NOTE — Progress Notes (Signed)
 [facility] Progress Note Patient: Tracy Jennings FMW:[id number] DOB: 09-21-1955  DOA: 05/16/2024 DOS: the patient was seen and examined on 06/04/2024  Brief Hospital Course: 68 year old F with PMH of cognitive impairment, ESRD on HD MWF, cervical cancer, HTN, HLD, anemia and recent hospitalizations from 6/24-7/22 for hypertensive urgency and delirium, and 8/17-8/24 for C. difficile colitis for which she was treated with p.o. vancomycin  brought back to [facility] from PCP office due to weakness, nausea, diarrhea and abdominal discomfort.  Was brought to [facility] for concern for Colo uterine fistula. Now medically stable. Assessment and Plan: C. difficile colitis ID was consulted. Was initially on oral vancomycin . Later on was on Dificid  completed for 10 days. Now back on oral vancomycin  for 7 more days.  EOT 9/24. Diarrhea appears to be improving starting 9/18.   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 Had significant abdominal pain.  Currently pain resolved. General surgery was consulted and recommend conservative management of treatment of C. difficile as well as outpatient follow-up. No indication for inpatient surgery per surgery. Tolerating oral diet.  So far no diarrhea.   Persistent leukocytosis/bandemia:  Improving.  Loculated left pleural effusion/masslike consolidation:  Seems chronic.  Has no respiratory symptoms.  IR consulted for thoracocentesis but pleural effusion felt to be small to tap safely.  Treated with Augmentin . Repeat chest x-ray in 4 to 6 weeks.  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 Currently on sliding scale insulin .   Dementia without behavioral disturbance:  Acute delirium. Appears to be confused. Minimizing pain medication. Oriented to self and family.  Failure to thrive/poor appetite/unintentional weight loss: Significant weight loss. Hopefully oral intake  will improve. Will monitor.   Uncontrolled hypertension:  Improved  Continue home clonidine  patch, losartan , Coreg , hydralazine  and Aldactone  Cardizem  increased from 180 mg daily to 360 mg daily on 9/5 Ultrafiltration per nephrology. Added low-dose Imdur . Unable to take Norvasc .  Holding off on increase losartan  due to right eye swelling.  Continue monitoring   Hyperkalemia Monitor.   Acute right IJ DVT:  Incidental finding.  Venous Doppler was ordered for left forearm swelling but showed acute right IJ DVT. Started on Eliquis    ESRD on HD MWF Nephrology following. Appreciate assistance. Continue hemodialysis as scheduled.  Anemia of renal disease:  Hemoglobin stable at around 7 right now. Folic acid  was low but within normal range. B12 also within low normal range. Will initiate supplementation. Received PRBC on 9/12.  Repeat PRBC ordered on 9/28  Follow-up H&H adequate. Will monitor.   Physical deconditioning Therapy recommended SNF.  Seems family is in agreement now   History of uterine cancer: Outpatient follow-up.   Severe malnutrition/failure to thrive/hypoalbuminemia Continue supplements.  Goals of care Palliative care consulted for goals of care.  For now holding off on further conversation.  Monitor for improvement.  Right facial swelling. Family reports that the right side of her face is swollen.  Pupils are equal and reactive to light.  No tenderness on extraocular muscle movement.  No tenderness on examination.  No warmth or redness seen as well. At present this is positional only.   Subjective: Family reported right facial swelling.  No nausea no vomiting.  No fever no chills.  Diarrhea resolved.  Physical Exam: Oral mucosa clear.  No crowding. No stridor. Pupils are equal and reactive to light. No conjunctival redness. No facial redness, warmth or tenderness on right. Right upper and lower eyelid  swelling seen. S1-S2 present. Bowel sound  present nontender. Aortic systolic murmur. Trace edema.  Data Reviewed: I have Reviewed nursing notes, Vitals, and Lab results. Since last encounter, pertinent lab results CBC and BMP   . I have ordered test including CBC and BMP  .   Disposition: Status is: Inpatient Remains inpatient appropriate because: Awaiting placement.  Place and maintain sequential compression device Start: 05/17/24 1416 SCDs Start: 05/17/24 0000 apixaban  (ELIQUIS ) tablet 5 mg    Family Communication: Family at bedside Level of care: Telemetry Medical   Vitals:   06/04/24 0529 06/04/24 0654 06/04/24 0808 06/04/24 1715  BP: (!) 200/54 (!) 157/54 (!) 160/63 (!) 144/51  Pulse: 81  82 73  Resp: 16  15 14   Temp: 98.2 F (36.8 C)  98 F (36.7 C) 97.9 F (36.6 C)  TempSrc: Oral  Axillary Axillary  SpO2: 97%  100% 97%  Weight:      Height:         Author: Yetta Blanch, MD 06/04/2024 8:34 PM  Please look on [email or web address] to find out who is on call.

## 2024-06-04 NOTE — Progress Notes (Signed)
 [facility] Progress Note   Subjective:   Seen in room. Family helping with breakfast. Completed dialysis yesterday - net UF 1.5L. Some facial edema today.   Objective Vitals:   06/03/24 2058 06/04/24 0529 06/04/24 0654 06/04/24 0808  BP: (!) 189/64 (!) 200/54 (!) 157/54 (!) 160/63  Pulse:  81  82  Resp:  16  15  Temp:  98.2 F (36.8 C)  98 F (36.7 C)  TempSrc:  Oral  Axillary  SpO2:  97%  100%  Weight:      Height:       Physical Exam General: Frail woman, NAD. Room air Heart: RRR Lungs: CTAB Extremities: No  LE edema Dialysis Access: L AVG  Additional Objective Labs: Basic Metabolic Panel: Recent Labs  Lab 05/29/24 0813 05/30/24 0314 05/31/24 0240 06/01/24 0322 06/02/24 0433 06/03/24 0501  NA 133* 135   < > 136 135 134*  K 5.4* 4.4   < > 4.3 5.0 5.3*  CL 91* 92*   < > 95* 95* 96*  CO2 29 28   < > 28 28 27   GLUCOSE 97 232*   < > 132* 107* 107*  BUN 45* 32*   < > 24* 35* 44*  CREATININE 3.79* 2.99*   < > 2.90* 3.63* 4.58*  CALCIUM  8.0* 7.8*   < > 7.7* 7.6* 7.7*  PHOS 3.8 3.5  --   --   --   --    < > = values in this interval not displayed.   Liver Function Tests: Recent Labs  Lab 06/01/24 0322 06/02/24 0433 06/03/24 0501  AST 15 13* 13*  ALT 10 10 10   ALKPHOS 69 63 59  BILITOT 0.4 0.7 0.7  PROT 5.3* 5.5* 5.3*  ALBUMIN <1.5* <1.5* 1.5*   CBC: Recent Labs  Lab 05/31/24 0240 05/31/24 1553 06/01/24 0322 06/01/24 1636 06/02/24 0433 06/03/24 0501  WBC 11.9* 13.0* 13.3*  --  12.9* 11.5*  NEUTROABS 10.3*  --  11.5*  --  11.2* 9.8*  HGB 7.1* 7.6* 6.9* 9.1* 9.0* 8.8*  HCT 23.2* 25.2* 22.4* 28.4* 28.4* 28.5*  MCV 91.7 92.0 93.3  --  90.4 91.9  PLT 222 257 235  --  240 241   Medications:   (feeding supplement) PROSource Plus  30 mL Oral BID BM   apixaban   5 mg Oral BID   B-complex with vitamin C  1 tablet Oral Daily   carvedilol   25 mg Oral BID WC   Chlorhexidine  Gluconate Cloth  6 each Topical Q0600   cloNIDine   0.3 mg  Transdermal Weekly   darbepoetin (ARANESP ) injection - DIALYSIS  100 mcg Subcutaneous Q Fri-1800   diltiazem   360 mg Oral Daily   famotidine   20 mg Oral Daily   ferrous sulfate   325 mg Oral Daily   Gerhardt's butt cream   Topical QID   insulin  aspart  0-5 Units Subcutaneous QHS   insulin  aspart  0-9 Units Subcutaneous TID WC   lactose free nutrition  237 mL Oral TID WC   losartan   50 mg Oral Daily   multivitamin  1 tablet Oral QHS   sodium chloride  flush  10-40 mL Intracatheter Q12H   thiamine   100 mg Oral Daily    Dialysis Orders MWF [facility]  3.5hrs, 400/500, EDW 53kg, 2K/2.5Ca bath, AVG, heparin  6000 unit bolus + 400u/hr pump - Calcitriol  1.42mcg with HD - Sensipar 60mg  with HD - Micera 75mcg Q 4 weeks - per records, dose  just ordered (not given) - Binder: Calcium  Acetate 667mh TID with meals   Assessment/Plan: Colo-uterine fistula: per abd CT. Appears surgery has signed off and plans outpt elective management; ID recommended Augmentin . C-diff colitis: On PO Vancomycin , WBC remains high but improving. LUE edema: LUE duplex ordered by Hospitalist - no issue with LUE but acute non occlusive RIJ clot noted - started on eliquis .  No issues with AVG thus far. Monitor. ESRD: on HD MWF.  Continue on schedule.  HTN/volume: HTN on 5 BP agents, BP lower here - have stopped spironolactone . Continue volume lowering with HD.  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   continue Aranesp  q Friday. Last 100 mcg on 9/19.  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 Dementia Malnutrition: NG tube placed, TF started 9/10 - stopped 9/14 (exacerbated diarrhea), NG tube now out. Eating better-ish. Hyperkalemia: S/p 1 dose Lokelma  followed by HD on 9/15. GOC: Palliative consulted -  ongoing loose stools, hospice considered but now eating a little better, so family has elected to stay the course, full scope for now. Technically tolerates HD, but  doubt it is contributing to QOL. SNF placement pending.  Tracy Ronnald Acosta PA-C [facility] 06/04/2024,10:11 AM

## 2024-06-04 NOTE — Progress Notes (Signed)
 Physical Therapy Treatment Patient Details Name: Tracy Jennings MRN: [id number] DOB: Feb 19, 1956 Today's Date: 06/04/2024   History of Present Illness Patient is 68 yo female admitted on 05/16/24 for generalized weakness concerns for colouterine fistula. + C. Diff. PMH significant for dementia, HTN, DM, HLD, cervical cancer, recurrent pancreatitis, ESRD on HD.    PT Comments  Pt resting in bed on arrival, agreeable to session with encouragement. Pt rolling R/L with min A with pt requiring min A to maintain side-lying for peri-care. Pt able to come to sitting EOB with min A +2 and maintain with light min A as pt with R lateral lean due to pain. Pt remaining sitting up EOB at end of session on handoff to OT as pt declining standing trials this session. Pt continues to benefit from skilled PT services to progress toward functional mobility goals.     If plan is discharge home, recommend the following: A lot of help with walking and/or transfers;A lot of help with bathing/dressing/bathroom;Assist for transportation;Help with stairs or ramp for entrance;Supervision due to cognitive status;Assistance with cooking/housework;Direct supervision/assist for medications management;Direct supervision/assist for financial management   Can travel by private vehicle     No  Equipment Recommendations  None recommended by PT;Other (comment)    Recommendations for Other Services       Precautions / Restrictions Precautions Precautions: Fall Recall of Precautions/Restrictions: Impaired Restrictions Weight Bearing Restrictions Per Provider Order: No     Mobility  Bed Mobility Overal bed mobility: Needs Assistance Bed Mobility: Supine to Sit, Rolling Rolling: Min assist   Supine to sit: Min assist, +2 for physical assistance     General bed mobility comments: min A to roll R/L and maintain L sidelying, min A +2 to bring LEs to and off EOB and to elevate trunk to sitting    Transfers                    General transfer comment: pt declining sitting up EOB    Ambulation/Gait                   Stairs             Wheelchair Mobility     Tilt Bed    Modified Rankin (Stroke Patients Only)       Balance Overall balance assessment: Needs assistance Sitting-balance support: Feet supported Sitting balance-Leahy Scale: Poor Sitting balance - Comments: R lateral and anterior flexion due to cervical pain                                    Communication Communication Communication: No apparent difficulties  Cognition Arousal: Lethargic, Alert (increased alertness with transfer to sitting EOB) Behavior During Therapy: Flat affect   PT - Cognitive impairments: History of cognitive impairments, Initiation, Awareness, Safety/Judgement                         Following commands: Impaired Following commands impaired: Follows one step commands with increased time    Cueing Cueing Techniques: Verbal cues, Gestural cues, Tactile cues, Visual cues  Exercises      General Comments        Pertinent Vitals/Pain Pain Assessment Pain Assessment: Faces Faces Pain Scale: Hurts little more Pain Location: peri area with peri-care, head and neck Pain Descriptors / Indicators: Tender, Discomfort Pain Intervention(s): Monitored during session, Limited activity  within patient's tolerance, Other (comment) (seated stretching)    Home Living                          Prior Function            PT Goals (current goals can now be found in the care plan section) Acute Rehab PT Goals PT Goal Formulation: With patient/family Time For Goal Achievement: 06/01/24 Progress towards PT goals: Not progressing toward goals - comment    Frequency    Min 2X/week      PT Plan      Co-evaluation              AM-PAC PT 6 Clicks Mobility   Outcome Measure  Help needed turning from your back to your side while in a flat bed  without using bedrails?: A Little Help needed moving from lying on your back to sitting on the side of a flat bed without using bedrails?: A Lot Help needed moving to and from a bed to a chair (including a wheelchair)?: Total Help needed standing up from a chair using your arms (e.g., wheelchair or bedside chair)?: Total Help needed to walk in hospital room?: Total Help needed climbing 3-5 steps with a railing? : Total 6 Click Score: 9    End of Session   Activity Tolerance: Patient limited by fatigue (self limiting) Patient left: in bed;Other (comment) (on handoff to OT) Nurse Communication: Mobility status PT Visit Diagnosis: Muscle weakness (generalized) (M62.81);Unsteadiness on feet (R26.81)     Time: 1440-1459 PT Time Calculation (min) (ACUTE ONLY): 19 min  Charges:    $Therapeutic Activity: 8-22 mins PT General Charges $$ ACUTE PT VISIT: 1 Visit                     Tran Randle R. PTA Acute Rehabilitation Services Office: [phone]   [name] 06/04/2024, 3:08 PM

## 2024-06-05 DIAGNOSIS — N824 Other female intestinal-genital tract fistulae: Secondary | ICD-10-CM | POA: Diagnosis not present

## 2024-06-05 DIAGNOSIS — I10 Essential (primary) hypertension: Secondary | ICD-10-CM | POA: Diagnosis not present

## 2024-06-05 DIAGNOSIS — F03918 Unspecified dementia, unspecified severity, with other behavioral disturbance: Secondary | ICD-10-CM | POA: Diagnosis not present

## 2024-06-05 DIAGNOSIS — A0472 Enterocolitis due to Clostridium difficile, not specified as recurrent: Secondary | ICD-10-CM | POA: Diagnosis not present

## 2024-06-05 LAB — GLUCOSE, CAPILLARY
Glucose-Capillary: 78 mg/dL (ref 70–99)
Glucose-Capillary: 96 mg/dL (ref 70–99)
Glucose-Capillary: 111 mg/dL — ABNORMAL HIGH (ref 70–99)

## 2024-06-05 MED ORDER — PENTAFLUOROPROP-TETRAFLUOROETH EX AERO: 1.0000 | INHALATION_SPRAY | CUTANEOUS | Status: DC | PRN

## 2024-06-05 MED ORDER — OXYCODONE HCL 5 MG PO TABS
ORAL_TABLET | ORAL | Status: AC
  Filled 2024-06-05: qty 1

## 2024-06-05 MED ORDER — LIDOCAINE HCL (PF) 1 % IJ SOLN: 5.0000 mL | INTRAMUSCULAR | Status: DC | PRN

## 2024-06-05 MED ORDER — ALTEPLASE 2 MG IJ SOLR: 2.0000 mg | Freq: Once | INTRAMUSCULAR | Status: DC | PRN

## 2024-06-05 MED ORDER — ANTICOAGULANT SODIUM CITRATE 4% (200MG/5ML) IV SOLN: 5.0000 mL | Status: DC | PRN

## 2024-06-05 MED ORDER — LIDOCAINE-PRILOCAINE 2.5-2.5 % EX CREA: 1.0000 | TOPICAL_CREAM | CUTANEOUS | Status: DC | PRN

## 2024-06-05 NOTE — Procedures (Signed)
 Patient seen on Hemodialysis. BP (!) 119/55 (BP Location: Right Arm)   Pulse 66   Temp 98.3 F (36.8 C) (Oral)   Resp (!) 9   Ht 5' 3 (1.6 m)   Wt 54 kg   LMP  (LMP Unknown)   SpO2 97%   BMI 21.09 kg/m   QB 400, UF goal revised to 2.5L Tolerating treatment without complaints at this time.   Gordy Blanch MD [facility]. Office # [phone] Pager # [phone] 10:31 AM

## 2024-06-05 NOTE — Progress Notes (Signed)
 [facility]  PROGRESS NOTE  Tracy Jennings FMW:[id number] DOB: 1955/11/07 DOA: 05/16/2024 PCP: Zachary Idelia LABOR, MD   Brief HPI:   68 year old F with PMH of cognitive impairment, ESRD on HD MWF, cervical cancer, HTN, HLD, anemia and recent hospitalizations from 6/24-7/22 for hypertensive urgency and delirium, and 8/17-8/24 for C. difficile colitis for which she was treated with p.o. vancomycin  brought back to [place] from PCP office due to weakness, nausea, diarrhea and abdominal discomfort.  Was brought to [facility] for concern for Colo uterine fistula. Now medically stable.    Assessment/Plan:   C. difficile colitis ID was consulted. Was initially on oral vancomycin . Later on was on Dificid  completed for 10 days. Now back on oral vancomycin  for 7 more days.  EOT 9/24. Diarrhea appears to be improving starting 9/18.   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 Had significant abdominal pain.  Currently pain resolved. General surgery was consulted and recommend conservative management of treatment of C. difficile as well as outpatient follow-up. No indication for inpatient surgery per surgery. Tolerating oral diet.  So far no diarrhea.   Persistent leukocytosis/bandemia:  Improving.   Loculated left pleural effusion/masslike consolidation:  Seems chronic.  Has no respiratory symptoms.  IR consulted for thoracocentesis but pleural effusion felt to be small to tap safely.  Treated with Augmentin . Repeat chest x-ray in 4 to 6 weeks.   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 Currently on sliding scale insulin .   Dementia without behavioral disturbance:  Acute delirium. Appears to be confused. Minimizing pain medication. Oriented to self and family.   Failure to thrive/poor appetite/unintentional weight loss: Significant weight loss. Hopefully oral intake will improve. Will monitor.    Uncontrolled hypertension:  Improved  Continue home clonidine  patch, losartan , Coreg , hydralazine  and Aldactone  Cardizem  increased from 180 mg daily to 360 mg daily on 9/5 Ultrafiltration per nephrology. Added low-dose Imdur . Unable to take Norvasc .  Holding off on increase losartan  due to right eye swelling.  Continue monitoring   Hyperkalemia Monitor.   Acute right IJ DVT:  Incidental finding.  Venous Doppler was ordered for left forearm swelling but showed acute right IJ DVT. Started on Eliquis    ESRD on HD MWF Nephrology following. Appreciate assistance. Continue hemodialysis as scheduled.   Anemia of renal disease:  Hemoglobin stable at around 7 right now. Folic acid  was low but within normal range. B12 also within low normal range. Will initiate supplementation. Received PRBC on 9/12.  Repeat PRBC ordered on 9/28  Follow-up H&H adequate. Will monitor.   Physical deconditioning Therapy recommended SNF.  Seems family is in agreement now   History of uterine cancer: Outpatient follow-up.   Severe malnutrition/failure to thrive/hypoalbuminemia Continue supplements.   Goals of care Palliative care consulted for goals of care.  For now holding off on further conversation.  Monitor for improvement.   Right facial swelling. Family reports that the right side of her face is swollen.  Pupils are equal and reactive to light.  No tenderness on extraocular muscle movement.  No tenderness on examination.  No warmth or redness seen as well. At present this is positional only.     Medications     (feeding supplement) PROSource Plus  30 mL Oral BID BM   apixaban   5 mg Oral BID   B-complex with vitamin C  1 tablet Oral Daily   carvedilol   25 mg Oral BID WC  Chlorhexidine  Gluconate Cloth  6 each Topical Q0600   cloNIDine   0.3 mg Transdermal Weekly   darbepoetin (ARANESP ) injection - DIALYSIS  100 mcg Subcutaneous Q Fri-1800   diltiazem   360 mg Oral Daily   famotidine    20 mg Oral Daily   ferrous sulfate   325 mg Oral Daily   Gerhardt's butt cream   Topical QID   insulin  aspart  0-5 Units Subcutaneous QHS   insulin  aspart  0-9 Units Subcutaneous TID WC   isosorbide  mononitrate  30 mg Oral Daily   lactose free nutrition  237 mL Oral TID WC   losartan   50 mg Oral Daily   multivitamin  1 tablet Oral QHS   sodium chloride  flush  10-40 mL Intracatheter Q12H   thiamine   100 mg Oral Daily     Data Reviewed:   CBG:  Recent Labs  Lab 06/04/24 1212 06/04/24 1709 06/04/24 2109 06/05/24 0819 06/05/24 1741  GLUCAP 107* 97 87 78 96    SpO2: 98 %    Vitals:   06/05/24 1130 06/05/24 1200 06/05/24 1230 06/05/24 1240  BP: (!) 109/48 (!) 133/44 (!) 138/58 (!) 134/45  Pulse: 71 71 (!) 33 73  Resp: 17 15 14 13   Temp:    98.4 F (36.9 C)  TempSrc:      SpO2: 100% 93% 98% 98%  Weight:    53.7 kg  Height:          Data Reviewed:  Basic Metabolic Panel: Recent Labs  Lab 05/30/24 0314 05/31/24 0240 06/01/24 0322 06/02/24 0433 06/03/24 0501  NA 135 135 136 135 134*  K 4.4 4.8 4.3 5.0 5.3*  CL 92* 95* 95* 95* 96*  CO2 28 30 28 28 27   GLUCOSE 232* 110* 132* 107* 107*  BUN 32* 39* 24* 35* 44*  CREATININE 2.99* 3.69* 2.90* 3.63* 4.58*  CALCIUM  7.8* 7.7* 7.7* 7.6* 7.7*  MG 1.9 1.8 1.8 1.9 2.1  PHOS 3.5  --   --   --   --     CBC: Recent Labs  Lab 05/30/24 0314 05/30/24 1717 05/31/24 0240 05/31/24 1553 06/01/24 0322 06/01/24 1636 06/02/24 0433 06/03/24 0501  WBC 14.9*   < > 11.9* 13.0* 13.3*  --  12.9* 11.5*  NEUTROABS 13.2*  --  10.3*  --  11.5*  --  11.2* 9.8*  HGB 7.8*   < > 7.1* 7.6* 6.9* 9.1* 9.0* 8.8*  HCT 25.8*   < > 23.2* 25.2* 22.4* 28.4* 28.4* 28.5*  MCV 92.8   < > 91.7 92.0 93.3  --  90.4 91.9  PLT 232   < > 222 257 235  --  240 241   < > = values in this interval not displayed.    LFT Recent Labs  Lab 05/30/24 0314 05/31/24 0240 06/01/24 0322 06/02/24 0433 06/03/24 0501  AST 17 16 15  13* 13*  ALT 12 12 10 10  10   ALKPHOS 68 84 69 63 59  BILITOT 0.7 0.4 0.4 0.7 0.7  PROT 5.4* 5.3* 5.3* 5.5* 5.3*  ALBUMIN <1.5* <1.5* <1.5* <1.5* 1.5*     Antibiotics: Anti-infectives (From admission, onward)    Start     Dose/Rate Route Frequency Ordered Stop   06/04/24 1215  vancomycin  (VANCOCIN ) capsule 125 mg        125 mg Oral Daily 06/04/24 1116 06/05/24 0621   05/27/24 1000  vancomycin  (VANCOCIN ) capsule 125 mg        125 mg Oral Daily 05/21/24  1019 06/04/24 0959   05/20/24 1530  amoxicillin -clavulanate (AUGMENTIN ) 500-125 MG per tablet 1 tablet        1 tablet Oral Every 12 hours 05/20/24 1519 05/29/24 2152   05/17/24 0100  fidaxomicin  (DIFICID ) tablet 200 mg        200 mg Oral 2 times daily 05/17/24 0003 05/26/24 1037   05/17/24 0100  metroNIDAZOLE  (FLAGYL ) IVPB 500 mg  Status:  Discontinued        500 mg 100 mL/hr over 60 Minutes Intravenous Every 12 hours 05/17/24 0003 05/20/24 1519        DVT prophylaxis: Apixaban   Code Status: Full code  Family Communication: No family at bedside   CONSULTS nephrology   Subjective   Patient seen during hemodialysis.  Denies any complaints   Objective    Physical Examination:  General-appears in no acute distress Heart-S1-S2, regular, no murmur auscultated Lungs-clear to auscultation bilaterally, no wheezing or crackles auscultated Abdomen-soft, nontender, no organomegaly Extremities-no edema in the lower extremities Neuro-alert, oriented x3, no focal deficit noted  Status is: Inpatient:             Sabas GORMAN Brod   [facility] If 7PM-7AM, please contact night-coverage at [email or web address], Office  [phone]   06/05/2024, 7:14 PM  LOS: 20 days

## 2024-06-05 NOTE — Progress Notes (Signed)
 Notified CSW of pt's HD times at out-pt clinic for d/c planning purposes. Pt goes to [facility] on [address]. Pt should arrive at 11:25 am for 11:40 am chair time. Will assist as needed.   Randine Mungo Dialysis Navigator [phone]

## 2024-06-05 NOTE — TOC Progression Note (Addendum)
 Transition of Care [facility]) - Progression Note    Patient Details  Name: GRACELAND WACHTER MRN: [id number] Date of Birth: [birth year]  [facility] CM/SW Contact  Inocente GORMAN Kindle, LCSW Phone Number: 06/05/2024, 9:46 AM  Clinical Narrative:    9:46am-Insurance approval pending for [facility], Ref# [id number].    2:50 PM-Insurance authorization still pending.    Expected Discharge Plan: Skilled Nursing Facility Barriers to Discharge: Insurance Authorization               Expected Discharge Plan and Services       Living arrangements for the past 2 months: Single Family Home                                       Social Drivers of Health (SDOH) Interventions SDOH Screenings   Food Insecurity: No Food Insecurity (05/16/2024)  Recent Concern: Food Insecurity - Food Insecurity Present (04/24/2024)   Received [facility]  Housing: Low Risk  (05/16/2024)  Transportation Needs: No Transportation Needs (05/16/2024)  Recent Concern: Transportation Needs - Unmet Transportation Needs (04/24/2024)   Received from [facility]  Utilities: Not At Risk (05/16/2024)  Financial Resource Strain: High Risk (04/24/2024)   Received from [facility]  Social Connections: Unknown (05/16/2024)  Tobacco Use: Low Risk  (05/16/2024)    Readmission Risk Interventions     No data to display

## 2024-06-05 NOTE — Progress Notes (Signed)
 Nutrition Follow-up  DOCUMENTATION CODES:   Severe malnutrition in context of chronic illness  INTERVENTION:  Encourage PO intake - Currently on Dysphagia 2, thin liquids  Nursing to assist with meals  Boost Plus TID , Each supplement provides 360 kcal and 14 gm protein Prosource Plus BID, each supplement provides 100 kcal and 15 gm protein  Rena-Vite daily  100 mg Thiamine  daily    *Per family, pt is lactose intolerant    NUTRITION DIAGNOSIS:   Severe Malnutrition related to chronic illness as evidenced by severe muscle depletion, severe fat depletion, meal completion < 25%, per patient/family report, energy intake < or equal to 75% for > or equal to 1 month. - Ongogin  GOAL:   Patient will meet greater than or equal to 90% of their needs - Progressing   MONITOR:   PO intake, Supplement acceptance, Labs, I & O's  REASON FOR ASSESSMENT:   Consult Assessment of nutrition requirement/status  ASSESSMENT:   Pt with PMH of dementia, ESRD on HD MWF, cervical ca, HTN, HLD, anemia, recent hospitalizations from 6/24 - 7/22 for hypertensive urgency and delirium, and 8/17 - 8/24 for Cdiff colitis now admitted from home with abd discomfort, weakness, Nausea, and diarrhea.  9/4 - CT of abd and pelvis showed rectosigmoid colon suspicious for colitis, colo-uterine fistula, increased bilateral pleural effusions, and progressive anasarca. Surgery, Dasie, consulted and recommends continued treatment for cdiff and outpt follow up and ok for diet advancement.  9/8 - Left pleural effusion too small for Thoracocentesis, concern for malignancy. HD today  9/10 - Cortrak placed (needs to be confirmed by x-ray before use), TF to be started, HD today  9/14 - tube feeds held 9/15 - Tube feeds discontinued, cortrak only for meds 9/17 - NGT removed    Pt in HD at time of visit, no family bedside. Spoke to RN who reports pt's intake is improving. Had 100% of scrambled eggs and sausage this morning  with 50% of soy milk and bites of cream of wheat. Meals document show pt consuming on average 30% of all her meals with some meals eating 100% but then pt seems to only eat bites on the next meal. Drinking at least 1 Boost Plus per day, taking Prosource Plus. Noted that diarrhea seems to be resolving however pt did have 2 type 7 BM yesterday.   Per GOC, family considered hospice but now that pt is eating better will remain full scope. Tolerating HD however nephrology is questioning if it is contributing to QOL.   Disposition: SNF pending bed offer and insurance authorization   Admit weight: 57 kg Current weight: 55.5 kg EDW: 53 kg   Average Meal Intake: 9/5-9/9: 6% intake x 4 recorded meals 9/10-9/12: 0% intake x 2 recorded meals 9/17-9/23: 30% intake x 6 recorded meals   Nutritionally Relevant Medications: Scheduled Meds:  (feeding supplement) PROSource Plus  30 mL Oral BID BM   B-complex with vitamin C  1 tablet Oral Daily   diltiazem   360 mg Oral Daily   famotidine   20 mg Oral Daily   ferrous sulfate   325 mg Oral Daily   insulin  aspart  0-5 Units Subcutaneous QHS   insulin  aspart  0-9 Units Subcutaneous TID WC   lactose free nutrition  237 mL Oral TID WC   losartan   50 mg Oral Daily   multivitamin  1 tablet Oral QHS   thiamine   100 mg Oral Daily   Continuous Infusions:  anticoagulant sodium citrate   Labs Reviewed: CBG ranges from 78-87 mg/dL over the last 24 hours HgbA1c 5.4  Diet Order:   Diet Order             DIET DYS 2 Room service appropriate? Yes; Fluid consistency: Thin  Diet effective now                   EDUCATION NEEDS:   Not appropriate for education at this time  Skin:  Skin Assessment: Reviewed RN Assessment  Last BM:  9/23 x 2, type 7  Height:   Ht Readings from Last 1 Encounters:  05/16/24 5' 3 (1.6 m)    Weight:   Wt Readings from Last 1 Encounters:  06/03/24 55.5 kg    BMI:  Body mass index is 21.67 kg/m.  Estimated  Nutritional Needs:   Kcal:  1600-1900 kcal  Protein:  80-100 gm  Fluid:  1 L + UOP   Olivia Kenning, RD Registered Dietitian  See Amion for more information

## 2024-06-05 NOTE — Progress Notes (Signed)
 The patient goal not met d/t cut off 40 mins.  The patient was restless and tried off the machine by herself. Dialysis system was clotted twice. Dr. Tobie notified and no order received.  Oxycodone  2.5 mg po given during HD.  06/05/24 1240  Vitals  Temp 98.4 F (36.9 C)  BP (!) 134/45  BP Location Right Arm  BP Method Automatic  Patient Position (if appropriate) Lying  Pulse Rate 73  Resp 13  Weight 53.7 kg  Type of Weight Post-Dialysis  Oxygen Therapy  SpO2 98 %  O2 Device Room Air  Patient Activity (if Appropriate) In bed  Pulse Oximetry Type Continuous  Oximetry Probe Site Changed No  During Treatment Monitoring  HD Safety Checks Performed Yes  Intra-Hemodialysis Comments Tx completed;Tolerated well  Post Treatment  Dialyzer Clearance Lightly streaked  Liters Processed 51.5  Fluid Removed (mL) 1.3 mL  Tolerated HD Treatment Yes  Post-Hemodialysis Comments see notes.  AVG/AVF Arterial Site Held (minutes) 5 minutes  AVG/AVF Venous Site Held (minutes) 5 minutes  Fistula / Graft Left Upper arm Arteriovenous fistula  No placement date or time found.   Orientation: Left  Access Location: Upper arm  Access Type: Arteriovenous fistula  Site Condition No complications  Fistula / Graft Assessment Present;Thrill;Bruit  Status Deaccessed  Needle Size 15  Drainage Description None

## 2024-06-06 DIAGNOSIS — N824 Other female intestinal-genital tract fistulae: Secondary | ICD-10-CM | POA: Diagnosis not present

## 2024-06-06 DIAGNOSIS — I10 Essential (primary) hypertension: Secondary | ICD-10-CM | POA: Diagnosis not present

## 2024-06-06 DIAGNOSIS — A0472 Enterocolitis due to Clostridium difficile, not specified as recurrent: Secondary | ICD-10-CM | POA: Diagnosis not present

## 2024-06-06 DIAGNOSIS — F03918 Unspecified dementia, unspecified severity, with other behavioral disturbance: Secondary | ICD-10-CM | POA: Diagnosis not present

## 2024-06-06 LAB — GLUCOSE, CAPILLARY
Glucose-Capillary: 80 mg/dL (ref 70–99)
Glucose-Capillary: 78 mg/dL (ref 70–99)
Glucose-Capillary: 111 mg/dL — ABNORMAL HIGH (ref 70–99)

## 2024-06-06 NOTE — Progress Notes (Signed)
 [facility] Progress Note   Subjective:   Seen in room. No complaints this am, just tired. Dialysis yesterday - circuit clotted and had some issues with restlessness.   Objective Vitals:   06/05/24 1240 06/05/24 2009 06/06/24 0357 06/06/24 0904  BP: (!) 134/45 113/67 (!) 132/52 133/76  Pulse: 73 (!) 41 65 64  Resp: 13 18 17 16   Temp: 98.4 F (36.9 C) 97.8 F (36.6 C) (!) 97.5 F (36.4 C) 98.3 F (36.8 C)  TempSrc:  Oral Oral Oral  SpO2: 98% 90% 98% 99%  Weight: 53.7 kg     Height:       Physical Exam General: Frail woman, NAD. Room air Heart: RRR Lungs: CTAB Extremities: No  LE edema Dialysis Access: L AVG  Additional Objective Labs: Basic Metabolic Panel: Recent Labs  Lab 06/01/24 0322 06/02/24 0433 06/03/24 0501  NA 136 135 134*  K 4.3 5.0 5.3*  CL 95* 95* 96*  CO2 28 28 27   GLUCOSE 132* 107* 107*  BUN 24* 35* 44*  CREATININE 2.90* 3.63* 4.58*  CALCIUM  7.7* 7.6* 7.7*   Liver Function Tests: Recent Labs  Lab 06/01/24 0322 06/02/24 0433 06/03/24 0501  AST 15 13* 13*  ALT 10 10 10   ALKPHOS 69 63 59  BILITOT 0.4 0.7 0.7  PROT 5.3* 5.5* 5.3*  ALBUMIN <1.5* <1.5* 1.5*   CBC: Recent Labs  Lab 05/31/24 0240 05/31/24 1553 06/01/24 0322 06/01/24 1636 06/02/24 0433 06/03/24 0501  WBC 11.9* 13.0* 13.3*  --  12.9* 11.5*  NEUTROABS 10.3*  --  11.5*  --  11.2* 9.8*  HGB 7.1* 7.6* 6.9* 9.1* 9.0* 8.8*  HCT 23.2* 25.2* 22.4* 28.4* 28.4* 28.5*  MCV 91.7 92.0 93.3  --  90.4 91.9  PLT 222 257 235  --  240 241   Medications:   (feeding supplement) PROSource Plus  30 mL Oral BID BM   apixaban   5 mg Oral BID   B-complex with vitamin C  1 tablet Oral Daily   carvedilol   25 mg Oral BID WC   Chlorhexidine  Gluconate Cloth  6 each Topical Q0600   cloNIDine   0.3 mg Transdermal Weekly   darbepoetin (ARANESP ) injection - DIALYSIS  100 mcg Subcutaneous Q Fri-1800   diltiazem   360 mg Oral Daily   famotidine   20 mg Oral Daily   ferrous sulfate   325  mg Oral Daily   Gerhardt's butt cream   Topical QID   insulin  aspart  0-5 Units Subcutaneous QHS   insulin  aspart  0-9 Units Subcutaneous TID WC   isosorbide  mononitrate  30 mg Oral Daily   lactose free nutrition  237 mL Oral TID WC   losartan   50 mg Oral Daily   multivitamin  1 tablet Oral QHS   sodium chloride  flush  10-40 mL Intracatheter Q12H   thiamine   100 mg Oral Daily    Dialysis Orders MWF [facility]  3.5hrs, 400/500, EDW 53kg, 2K/2.5Ca bath, AVG, heparin  6000 unit bolus + 400u/hr pump - Calcitriol  1.17mcg with HD - Sensipar 60mg  with HD - Micera 75mcg Q 4 weeks - per records, dose just ordered (not given) - Binder: Calcium  Acetate 667mh TID with meals   Assessment/Plan: Colo-uterine fistula: per abd CT. Appears surgery has signed off and plans outpt elective management;  C-diff colitis: s/p course of po vancomycin .  LUE edema: LUE duplex ordered by Hospitalist - no issue with LUE but acute non occlusive RIJ clot noted - started on eliquis .  No issues with AVG thus far. Monitor. ESRD: on HD MWF.  Continue on schedule. Next HD Friday  HTN/volume: HTN on 5 BP agents, BP lower here - have stopped spironolactone . Continue volume lowering with HD. BP improving.   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   continue Aranesp  q Friday. Last 100 mcg on 9/19.  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 Dementia Malnutrition: NG tube placed, TF started 9/10 - stopped 9/14 (exacerbated diarrhea), NG tube now out. Eating better-ish. GOC: Palliative consulted -  ongoing loose stools, hospice considered but now eating a little better, so family has elected to stay the course, full scope for now. Technically tolerates HD, but doubt it is contributing to QOL. SNF placement pending.  Maisie Ronnald Acosta PA-C [facility] 06/06/2024,9:30 AM

## 2024-06-06 NOTE — Progress Notes (Signed)
 PT Cancellation Note  Patient Details Name: Tracy Jennings MRN: [id number] DOB: 1956/05/12   Cancelled Treatment:    Reason Eval/Treat Not Completed: (P) Other (comment), pt declining all mobility despite max encouragement and attempts to initiate movement and mobility. Will check back as schedule allows to continue with PT POC.  [name]. PTA Acute Rehabilitation Services Office: [phone]    [name] 06/06/2024, 2:22 PM

## 2024-06-06 NOTE — TOC Progression Note (Signed)
 Transition of Care [facility]) - Progression Note    Patient Details  Name: Tracy Jennings MRN: [id number] Date of Birth: Jun 29, 1956  [facility]) CM/SW Contact  Isaiah Public, LCSWA Phone Number: 06/06/2024, 11:22 AM  Clinical Narrative:     Insurance authorization currently pending for SNF. Patient has SNF bed at [facility]. CSW will continue to follow.  Expected Discharge Plan: Skilled Nursing Facility Barriers to Discharge: Insurance Authorization               Expected Discharge Plan and Services       Living arrangements for the past 2 months: Single Family Home                                       Social Drivers of Health (SDOH) Interventions SDOH Screenings   Food Insecurity: No Food Insecurity (05/16/2024)  Recent Concern: Food Insecurity - Food Insecurity Present (04/24/2024)   Received from [facility]  Housing: Low Risk  (05/16/2024)  Transportation Needs: No Transportation Needs (05/16/2024)  Recent Concern: Transportation Needs - Unmet Transportation Needs (04/24/2024)   Received from [facility]  Utilities: Not At Risk (05/16/2024)  Financial Resource Strain: High Risk (04/24/2024)   Received from [facility]  Social Connections: Unknown (05/16/2024)  Tobacco Use: Low Risk  (05/16/2024)    Readmission Risk Interventions     No data to display

## 2024-06-06 NOTE — Progress Notes (Signed)
 [facility]  PROGRESS NOTE  Tracy Jennings FMW:[id number] DOB: 1956/07/07 DOA: 05/16/2024 PCP: Zachary Idelia LABOR, MD   Brief HPI:   68 year old F with PMH of cognitive impairment, ESRD on HD MWF, cervical cancer, HTN, HLD, anemia and recent hospitalizations from 6/24-7/22 for hypertensive urgency and delirium, and 8/17-8/24 for C. difficile colitis for which she was treated with p.o. vancomycin  brought back to [facility] from PCP office due to weakness, nausea, diarrhea and abdominal discomfort.  Was brought to [facility] for concern for Colo uterine fistula. Now medically stable.    Assessment/Plan:   C. difficile colitis ID was consulted. Was initially on oral vancomycin . Later on was on Dificid  completed for 10 days. Now back on oral vancomycin  for 7 more days.  EOT 9/24. Diarrhea appears to be improving starting 9/18.   Colouterine fistula reason for transfer from [facility] to [facility]. Had significant abdominal pain.  Currently pain resolved. General surgery was consulted and recommend conservative management of treatment of C. difficile as well as outpatient follow-up. No indication for inpatient surgery per surgery. Tolerating oral diet.  So far no diarrhea.   Persistent leukocytosis/bandemia:  Improving.   Loculated left pleural effusion/masslike consolidation:  Seems chronic.  Has no respiratory symptoms.  IR consulted for thoracocentesis but pleural effusion felt to be small to tap safely.  Treated with Augmentin . Repeat chest x-ray in 4 to 6 weeks.   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 Currently on sliding scale insulin .   Dementia without behavioral disturbance:  Acute delirium. Appears to be confused. Minimizing pain medication. Oriented to self and family.   Failure to thrive/poor appetite/unintentional weight loss: Significant weight loss. Hopefully oral intake will improve. Will monitor.    Uncontrolled hypertension:  Improved  Continue home clonidine  patch, losartan , Coreg , hydralazine  and Aldactone  Cardizem  increased from 180 mg daily to 360 mg daily on 9/5 Ultrafiltration per nephrology. Added low-dose Imdur . Unable to take Norvasc .  Holding off on increase losartan  due to right eye swelling.  Continue monitoring   Hyperkalemia Monitor.   Acute right IJ DVT:  Incidental finding.  Venous Doppler was ordered for left forearm swelling but showed acute right IJ DVT. Started on Eliquis    ESRD on HD MWF Nephrology following. Appreciate assistance. Continue hemodialysis as scheduled.   Anemia of renal disease:  Hemoglobin stable at around 7 right now. Folic acid  was low but within normal range. B12 also within low normal range. Will initiate supplementation. Received PRBC on 9/12.  Repeat PRBC ordered on 9/28  Follow-up H&H adequate. Will monitor.   Physical deconditioning Therapy recommended SNF.  Seems family is in agreement now   History of uterine cancer: Outpatient follow-up.   Severe malnutrition/failure to thrive/hypoalbuminemia Continue supplements.   Goals of care Palliative care consulted for goals of care.  For now holding off on further conversation.  Monitor for improvement.   Right facial swelling. Family reports that the right side of her face is swollen.  Pupils are equal and reactive to light.  No tenderness on extraocular muscle movement.  No tenderness on examination.  No warmth or redness seen as well. At present this is positional only.     Medications     (feeding supplement) PROSource Plus  30 mL Oral BID BM   apixaban   5 mg Oral BID   B-complex with vitamin C  1 tablet Oral Daily   carvedilol   25 mg Oral BID WC  Chlorhexidine  Gluconate Cloth  6 each Topical Q0600   cloNIDine   0.3 mg Transdermal Weekly   darbepoetin (ARANESP ) injection - DIALYSIS  100 mcg Subcutaneous Q Fri-1800   diltiazem   360 mg Oral Daily   famotidine    20 mg Oral Daily   ferrous sulfate   325 mg Oral Daily   Gerhardt's butt cream   Topical QID   insulin  aspart  0-5 Units Subcutaneous QHS   insulin  aspart  0-9 Units Subcutaneous TID WC   isosorbide  mononitrate  30 mg Oral Daily   lactose free nutrition  237 mL Oral TID WC   losartan   50 mg Oral Daily   multivitamin  1 tablet Oral QHS   sodium chloride  flush  10-40 mL Intracatheter Q12H   thiamine   100 mg Oral Daily     Data Reviewed:   CBG:  Recent Labs  Lab 06/04/24 2109 06/05/24 0819 06/05/24 1741 06/05/24 2153 06/06/24 0920  GLUCAP 87 78 96 111* 80    SpO2: 99 %    Vitals:   06/05/24 1240 06/05/24 2009 06/06/24 0357 06/06/24 0904  BP: (!) 134/45 113/67 (!) 132/52 133/76  Pulse: 73 (!) 41 65 64  Resp: 13 18 17 16   Temp: 98.4 F (36.9 C) 97.8 F (36.6 C) (!) 97.5 F (36.4 C) 98.3 F (36.8 C)  TempSrc:  Oral Oral Oral  SpO2: 98% 90% 98% 99%  Weight: 53.7 kg     Height:          Data Reviewed:  Basic Metabolic Panel: Recent Labs  Lab 05/31/24 0240 06/01/24 0322 06/02/24 0433 06/03/24 0501  NA 135 136 135 134*  K 4.8 4.3 5.0 5.3*  CL 95* 95* 95* 96*  CO2 30 28 28 27   GLUCOSE 110* 132* 107* 107*  BUN 39* 24* 35* 44*  CREATININE 3.69* 2.90* 3.63* 4.58*  CALCIUM  7.7* 7.7* 7.6* 7.7*  MG 1.8 1.8 1.9 2.1    CBC: Recent Labs  Lab 05/31/24 0240 05/31/24 1553 06/01/24 0322 06/01/24 1636 06/02/24 0433 06/03/24 0501  WBC 11.9* 13.0* 13.3*  --  12.9* 11.5*  NEUTROABS 10.3*  --  11.5*  --  11.2* 9.8*  HGB 7.1* 7.6* 6.9* 9.1* 9.0* 8.8*  HCT 23.2* 25.2* 22.4* 28.4* 28.4* 28.5*  MCV 91.7 92.0 93.3  --  90.4 91.9  PLT 222 257 235  --  240 241    LFT Recent Labs  Lab 05/31/24 0240 06/01/24 0322 06/02/24 0433 06/03/24 0501  AST 16 15 13* 13*  ALT 12 10 10 10   ALKPHOS 84 69 63 59  BILITOT 0.4 0.4 0.7 0.7  PROT 5.3* 5.3* 5.5* 5.3*  ALBUMIN <1.5* <1.5* <1.5* 1.5*     Antibiotics: Anti-infectives (From admission, onward)    Start      Dose/Rate Route Frequency Ordered Stop   06/04/24 1215  vancomycin  (VANCOCIN ) capsule 125 mg        125 mg Oral Daily 06/04/24 1116 06/05/24 0621   05/27/24 1000  vancomycin  (VANCOCIN ) capsule 125 mg        125 mg Oral Daily 05/21/24 1019 06/04/24 0959   05/20/24 1530  amoxicillin -clavulanate (AUGMENTIN ) 500-125 MG per tablet 1 tablet        1 tablet Oral Every 12 hours 05/20/24 1519 05/29/24 2152   05/17/24 0100  fidaxomicin  (DIFICID ) tablet 200 mg        200 mg Oral 2 times daily 05/17/24 0003 05/26/24 1037   05/17/24 0100  metroNIDAZOLE  (FLAGYL ) IVPB 500 mg  Status:  Discontinued        500 mg 100 mL/hr over 60 Minutes Intravenous Every 12 hours 05/17/24 0003 05/20/24 1519        DVT prophylaxis: Apixaban   Code Status: Full code  Family Communication: No family at bedside   CONSULTS nephrology   Subjective   Denies any complaints.  Diarrhea has resolved.   Objective    Physical Examination:  Appears in no acute distress S1-S2, regular, no murmur auscultated Abdomen is soft, nontender, no organomegaly Extremities no edema  Status is: Inpatient:             Tracy Jennings   [facility] If 7PM-7AM, please contact night-coverage at [email or web address], Office  [phone]   06/06/2024, 11:00 AM  LOS: 21 days

## 2024-06-06 NOTE — Progress Notes (Signed)
 Contacted by CSW with update regarding d/c planning. Pt may have to d/c to home if pt's insurance does not approve snf. Contacted renal PA to inquire if there are any concerns of pt being able to tolerate HD in chair. PA feels it will likely not be an issue but will plan to trial HD in chair tomorrow. Contacted inpt HD unit staff to be made aware of this info. Will assist as needed.   Randine Mungo Dialysis Navigator [phone]

## 2024-06-06 NOTE — TOC Progression Note (Addendum)
 Transition of Care [facility]) - Progression Note    Patient Details  Name: Tracy Jennings MRN: [id number] Date of Birth: 1955/11/24  Transition of Care [facility]) CM/SW Contact  Inocente GORMAN Kindle, LCSW Phone Number: 06/06/2024, 12:36 PM  Clinical Narrative:    12:36 PM-CSW received insurance request for a peer to peer for snf, phone number is [phone] option 5, deadline is td by 4:30pm, her member id: [id number]. MD updated.    12:43 PM-CSW received call from [facility] admissions who stated they are now on an admissions hold. If peer to peer is approved, will need to check daily with SNF to see when bed is available.   2:43 PM-Peer to Peer was completed and denied. CSW spoke with patient's brother. He would like to appeal the denial. CSW completed appeal and faxed to [facility] at f. [phone]. CSW updated renal navigator to ensure patient can sit for outpatient Dialysis if she ends up discharging home. Brother stated he would start arranging someone to come and stay with her in the event the appeal is denied.    Expected Discharge Plan: Skilled Nursing Facility Barriers to Discharge: Insurance Authorization               Expected Discharge Plan and Services       Living arrangements for the past 2 months: Single Family Home                                       Social Drivers of Health (SDOH) Interventions SDOH Screenings   Food Insecurity: No Food Insecurity (05/16/2024)  Recent Concern: Food Insecurity - Food Insecurity Present (04/24/2024)   Received from [facility]  Housing: Low Risk  (05/16/2024)  Transportation Needs: No Transportation Needs (05/16/2024)  Recent Concern: Transportation Needs - Unmet Transportation Needs (04/24/2024)   Received from [facility]  Utilities: Not At Risk (05/16/2024)  Financial Resource Strain: High Risk (04/24/2024)   Received from [facility]  Social Connections: Unknown (05/16/2024)   Tobacco Use: Low Risk  (05/16/2024)    Readmission Risk Interventions     No data to display

## 2024-06-07 DIAGNOSIS — F03918 Unspecified dementia, unspecified severity, with other behavioral disturbance: Secondary | ICD-10-CM | POA: Diagnosis not present

## 2024-06-07 DIAGNOSIS — I10 Essential (primary) hypertension: Secondary | ICD-10-CM | POA: Diagnosis not present

## 2024-06-07 DIAGNOSIS — A0472 Enterocolitis due to Clostridium difficile, not specified as recurrent: Secondary | ICD-10-CM | POA: Diagnosis not present

## 2024-06-07 DIAGNOSIS — N824 Other female intestinal-genital tract fistulae: Secondary | ICD-10-CM | POA: Diagnosis not present

## 2024-06-07 LAB — GLUCOSE, CAPILLARY
Glucose-Capillary: 117 mg/dL — ABNORMAL HIGH (ref 70–99)
Glucose-Capillary: 121 mg/dL — ABNORMAL HIGH (ref 70–99)
Glucose-Capillary: 103 mg/dL — ABNORMAL HIGH (ref 70–99)
Glucose-Capillary: 91 mg/dL (ref 70–99)
Glucose-Capillary: 111 mg/dL — ABNORMAL HIGH (ref 70–99)

## 2024-06-07 LAB — RENAL FUNCTION PANEL
Albumin: 1.6 g/dL — ABNORMAL LOW (ref 3.5–5.0)
Anion gap: 13 (ref 5–15)
BUN: 30 mg/dL — ABNORMAL HIGH (ref 8–23)
CO2: 28 mmol/L (ref 22–32)
Calcium: 7.9 mg/dL — ABNORMAL LOW (ref 8.9–10.3)
Chloride: 95 mmol/L — ABNORMAL LOW (ref 98–111)
Creatinine, Ser: 4.1 mg/dL — ABNORMAL HIGH (ref 0.44–1.00)
GFR, Estimated: 11 mL/min — ABNORMAL LOW (ref >60)
Glucose, Bld: 107 mg/dL — ABNORMAL HIGH (ref 70–99)
Phosphorus: 4.5 mg/dL (ref 2.5–4.6)
Potassium: 4.3 mmol/L (ref 3.5–5.1)
Sodium: 136 mmol/L (ref 135–145)

## 2024-06-07 LAB — CBC
HCT: 26.8 % — ABNORMAL LOW (ref 36.0–46.0)
Hemoglobin: 8.1 g/dL — ABNORMAL LOW (ref 12.0–15.0)
MCH: 28.3 pg (ref 26.0–34.0)
MCHC: 30.2 g/dL (ref 30.0–36.0)
MCV: 93.7 fL (ref 80.0–100.0)
Platelets: 238 K/uL (ref 150–400)
RBC: 2.86 MIL/uL — ABNORMAL LOW (ref 3.87–5.11)
RDW: 20.7 % — ABNORMAL HIGH (ref 11.5–15.5)
WBC: 14.2 K/uL — ABNORMAL HIGH (ref 4.0–10.5)
nRBC: 0 % (ref 0.0–0.2)

## 2024-06-07 MED ORDER — LIDOCAINE HCL (PF) 1 % IJ SOLN: 5.0000 mL | INTRAMUSCULAR | Status: DC | PRN

## 2024-06-07 MED ORDER — LIDOCAINE-PRILOCAINE 2.5-2.5 % EX CREA: 1.0000 | TOPICAL_CREAM | CUTANEOUS | Status: DC | PRN

## 2024-06-07 MED ORDER — PENTAFLUOROPROP-TETRAFLUOROETH EX AERO: 1.0000 | INHALATION_SPRAY | CUTANEOUS | Status: DC | PRN

## 2024-06-07 MED ORDER — ANTICOAGULANT SODIUM CITRATE 4% (200MG/5ML) IV SOLN: 5.0000 mL | Status: DC | PRN

## 2024-06-07 MED ORDER — ALTEPLASE 2 MG IJ SOLR: 2.0000 mg | Freq: Once | INTRAMUSCULAR | Status: DC | PRN

## 2024-06-07 NOTE — Plan of Care (Signed)
  Problem: Health Behavior/Discharge Planning: Goal: Ability to manage health-related needs will improve Outcome: Progressing   Problem: Clinical Measurements: Goal: Ability to maintain clinical measurements within normal limits will improve Outcome: Progressing Goal: Respiratory complications will improve Outcome: Progressing

## 2024-06-07 NOTE — Progress Notes (Signed)
 Patient ID: Tracy Jennings, female   DOB: February 17, 1956, 68 y.o.   MRN: [id number] [facility] Progress Note   Assessment/ Plan:   1.  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  This has been complicated by C. difficile colitis for which she was on oral vancomycin  2. ESRD: Continue hemodialysis on MWF schedule with dialysis today to be attempted in recliner to assess for ability/safety of outpatient dialysis 3. Anemia: Low hemoglobin/hematocrit without any overt blood loss, continue to monitor with ongoing ESA therapy.   4. CKD-MBD: Calcium  and phosphorus levels are both at goal, binders on hold.  Not on VDR A. 5. Nutrition: Continue renal diet with ongoing protein supplementation to help improve caloric intake. 6. Hypertension: Blood pressure currently at goal, monitor on hemodialysis with ultrafiltration.  Subjective:   Still having intermittent diarrhea overnight.  Denies chest pain or shortness of breath.   Objective:   BP (!) 124/55 (BP Location: Right Arm)   Pulse 62   Temp 97.7 F (36.5 C) (Oral)   Resp 16   Ht 5' 3 (1.6 m)   Wt 53.7 kg   LMP  (LMP Unknown)   SpO2 100%   BMI 20.97 kg/m   Physical Exam: Gen: Appears fatigued but comfortable resting in bed CVS: Pulse regular rhythm, normal rate, S1 and S2 normal Resp: Clear to auscultation bilaterally, no distinct rales or rhonchi Abd: Soft, flat, nontender, bowel sounds normal Ext: No lower extremity edema.  Left upper arm AV graft with intact dressings  Labs: BMET Recent Labs  Lab 06/01/24 0322 06/02/24 0433 06/03/24 0501 06/07/24 0503  NA 136 135 134* 136  K 4.3 5.0 5.3* 4.3  CL 95* 95* 96* 95*  CO2 28 28 27 28   GLUCOSE 132* 107* 107* 107*  BUN 24* 35* 44* 30*  CREATININE 2.90* 3.63* 4.58* 4.10*  CALCIUM  7.7* 7.6* 7.7* 7.9*  PHOS  --   --   --  4.5   CBC Recent Labs  Lab 06/01/24 0322 06/01/24 1636 06/02/24 0433  06/03/24 0501 06/07/24 0503  WBC 13.3*  --  12.9* 11.5* 14.2*  NEUTROABS 11.5*  --  11.2* 9.8*  --   HGB 6.9* 9.1* 9.0* 8.8* 8.1*  HCT 22.4* 28.4* 28.4* 28.5* 26.8*  MCV 93.3  --  90.4 91.9 93.7  PLT 235  --  240 241 238      Medications:     (feeding supplement) PROSource Plus  30 mL Oral BID BM   apixaban   5 mg Oral BID   B-complex with vitamin C  1 tablet Oral Daily   carvedilol   25 mg Oral BID WC   Chlorhexidine  Gluconate Cloth  6 each Topical Q0600   cloNIDine   0.3 mg Transdermal Weekly   darbepoetin (ARANESP ) injection - DIALYSIS  100 mcg Subcutaneous Q Fri-1800   diltiazem   360 mg Oral Daily   famotidine   20 mg Oral Daily   ferrous sulfate   325 mg Oral Daily   Gerhardt's butt cream   Topical QID   insulin  aspart  0-5 Units Subcutaneous QHS   insulin  aspart  0-9 Units Subcutaneous TID WC   isosorbide  mononitrate  30 mg Oral Daily   lactose free nutrition  237 mL Oral TID WC   losartan   50 mg Oral Daily   multivitamin  1 tablet Oral QHS   sodium chloride  flush  10-40 mL Intracatheter Q12H   thiamine   100 mg Oral Daily  Gordy Blanch, MD 06/07/2024, 9:21 AM

## 2024-06-07 NOTE — Progress Notes (Signed)
 Physical Therapy Progress Note Patient Details Name: Tracy Jennings MRN: [id number] DOB: Feb 06, 1956 Today's Date: 06/07/2024   History of Present Illness Patient is 68 yo female admitted on 05/16/24 for generalized weakness concerns for colouterine fistula. + C. Diff. PMH significant for dementia, HTN, DM, HLD, cervical cancer, recurrent pancreatitis, ESRD on HD.    PT Comments  Pt goals were assessed and updated as appropriate this session. Pt is very slowly progressing with goals and has had some decline from initial evaluation. Currently pt is Max A +2 for bed mobility and stand pivot transfer. Pt is non-ambulatory at baseline. Due to pt current functional status, home set up and available assistance at home recommending skilled physical therapy services < 3 hours/day in order to address strength, balance and functional mobility to decrease risk for falls, injury, immobility, skin break down and re-hospitalization.      If plan is discharge home, recommend the following: A lot of help with walking and/or transfers;Assist for transportation;Help with stairs or ramp for entrance;Supervision due to cognitive status;Assistance with cooking/housework   Can travel by private vehicle     No  Equipment Recommendations  None recommended by PT       Precautions / Restrictions Precautions Precautions: Fall Recall of Precautions/Restrictions: Impaired Restrictions Weight Bearing Restrictions Per Provider Order: No     Mobility  Bed Mobility Overal bed mobility: Needs Assistance Bed Mobility: Supine to Sit     Supine to sit: +2 for physical assistance, Max assist     General bed mobility comments: verbal cues throughout for sequencing and body mechanics. Tactile cues to initiate each part of movement.    Transfers Overall transfer level: Needs assistance Equipment used: 2 person hand held assist Transfers: Sit to/from Stand, Bed to chair/wheelchair/BSC Sit to Stand: Max assist, +2  physical assistance, +2 safety/equipment Stand pivot transfers: Max assist, +2 physical assistance, +2 safety/equipment         General transfer comment: 2 person Max A for sit to stand and stand pivot to recliner with verbal cues for body mechanics and sequencing. Multi modal cues throughout. Pt attempting to assist with bil LE; alot of difficulty following directions.    Ambulation/Gait     General Gait Details: unable at this time. Pt is non-ambulatory at baseline     Balance Overall balance assessment: Needs assistance Sitting-balance support: Feet supported Sitting balance-Leahy Scale: Poor Sitting balance - Comments: L lateral lean with Mod A initially to maintain mid line; improved to SBA with cues. Postural control: Left lateral lean Standing balance support: Bilateral upper extremity supported Standing balance-Leahy Scale: Poor        Communication Communication Communication: No apparent difficulties  Cognition Arousal: Lethargic, Alert (increased alertness after sitting EOB) Behavior During Therapy: Flat affect   PT - Cognitive impairments: History of cognitive impairments, Initiation, Awareness, Safety/Judgement   Orientation impairments: Time, Situation, Place     PT - Cognition Comments: pt with eyes open but minally verbally responsive, stating yea a few times, more talkative with time sitting Following commands: Impaired Following commands impaired: Follows one step commands with increased time, Follows one step commands inconsistently    Cueing Cueing Techniques: Verbal cues, Gestural cues, Tactile cues, Visual cues     General Comments General comments (skin integrity, edema, etc.): family presented at end of session      Pertinent Vitals/Pain Pain Assessment Pain Assessment: Faces Faces Pain Scale: No hurt Breathing: normal Negative Vocalization: none Facial Expression: smiling or inexpressive Body Language:  relaxed Consolability: no need to  console PAINAD Score: 0 Pain Intervention(s): Monitored during session     PT Goals (current goals can now be found in the care plan section) Acute Rehab PT Goals Patient Stated Goal: Patient did not state personal goals. PT Goal Formulation: With patient/family Time For Goal Achievement: 06/21/24 Potential to Achieve Goals: Fair Progress towards PT goals: Progressing toward goals    Frequency    Min 2X/week      PT Plan  Continue with current POC        AM-PAC PT 6 Clicks Mobility   Outcome Measure  Help needed turning from your back to your side while in a flat bed without using bedrails?: A Little Help needed moving from lying on your back to sitting on the side of a flat bed without using bedrails?: A Lot Help needed moving to and from a bed to a chair (including a wheelchair)?: Total Help needed standing up from a chair using your arms (e.g., wheelchair or bedside chair)?: Total Help needed to walk in hospital room?: Total Help needed climbing 3-5 steps with a railing? : Total 6 Click Score: 9    End of Session Equipment Utilized During Treatment: Gait belt Activity Tolerance: Patient limited by fatigue;Patient tolerated treatment well Patient left: in chair;with call bell/phone within reach;with chair alarm set;with family/visitor present Nurse Communication: Mobility status PT Visit Diagnosis: Muscle weakness (generalized) (M62.81);Unsteadiness on feet (R26.81)     Time: 8897-8867 PT Time Calculation (min) (ACUTE ONLY): 30 min  Charges:    $Therapeutic Activity: 23-37 mins PT General Charges $$ ACUTE PT VISIT: 1 Visit                    Dorothyann Maier, DPT, CLT  Acute Rehabilitation Services Office: [phone] (Secure chat preferred)    Dorothyann VEAR Maier 06/07/2024, 2:44 PM

## 2024-06-07 NOTE — Progress Notes (Signed)
 [facility]  PROGRESS NOTE  Tracy Jennings FMW:[id number] DOB: 1956/06/15 DOA: 05/16/2024 PCP: Zachary Idelia LABOR, MD   Brief HPI:   68 year old F with PMH of cognitive impairment, ESRD on HD MWF, cervical cancer, HTN, HLD, anemia and recent hospitalizations from 6/24-7/22 for hypertensive urgency and delirium, and 8/17-8/24 for C. difficile colitis for which she was treated with p.o. vancomycin  brought back to [facility] from PCP office due to weakness, nausea, diarrhea and abdominal discomfort.  Was brought to [facility] for concern for Colo uterine fistula. Now medically stable.    Assessment/Plan:   C. difficile colitis ID was consulted. Was initially on oral vancomycin . Later on was on Dificid  completed for 10 days. Now back on oral vancomycin  for 7 more days.  EOT 9/24. Diarrhea appears to be improving starting 9/18.   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 Had significant abdominal pain.  Currently pain resolved. General surgery was consulted and recommend conservative management of treatment of C. difficile as well as outpatient follow-up. No indication for inpatient surgery per surgery. Tolerating oral diet.  So far no diarrhea.   Persistent leukocytosis/bandemia:  Improving.   Loculated left pleural effusion/masslike consolidation:  Seems chronic.  Has no respiratory symptoms.  IR consulted for thoracocentesis but pleural effusion felt to be small to tap safely.  Treated with Augmentin . Repeat chest x-ray in 4 to 6 weeks.   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 Currently on sliding scale insulin .   Dementia without behavioral disturbance:  Acute delirium. Appears to be confused. Minimizing pain medication. Oriented to self and family.   Failure to thrive/poor appetite/unintentional weight loss: Significant weight loss. Hopefully oral intake will improve. Will monitor.    Uncontrolled hypertension:  Improved  Continue home clonidine  patch, losartan , Coreg , hydralazine  and Aldactone  Cardizem  increased from 180 mg daily to 360 mg daily on 9/5 Ultrafiltration per nephrology. Added low-dose Imdur . Unable to take Norvasc .  Holding off on increase losartan  due to right eye swelling.  Continue monitoring   Hyperkalemia Monitor.   Acute right IJ DVT:  Incidental finding.  Venous Doppler was ordered for left forearm swelling but showed acute right IJ DVT. Started on Eliquis    ESRD on HD MWF Nephrology following. Appreciate assistance. Continue hemodialysis as scheduled.   Anemia of renal disease:  Hemoglobin stable at around 7 right now. Folic acid  was low but within normal range. B12 also within low normal range. Will initiate supplementation. Received PRBC on 9/12.  Repeat PRBC ordered on 9/28  Follow-up H&H adequate. Will monitor.   Physical deconditioning Therapy recommended SNF.  Seems family is in agreement now   History of uterine cancer: Outpatient follow-up.   Severe malnutrition/failure to thrive/hypoalbuminemia Continue supplements.   Goals of care Palliative care consulted for goals of care.  For now holding off on further conversation.  Monitor for improvement.   Right facial swelling. --In setting of anasarca due to hypoalbuminemia -Swelling shifts once patient changes her position from left to right Family reports that the right side of her face is swollen.  Pupils are equal and reactive to light.  No tenderness on extraocular muscle movement.  No tenderness on examination.  No warmth or redness seen as well. At present this is positional only.  - Discussed with nephrology, due to anasarca.  Disposition - Insurance company has refused skilled nursing facility for rehab - Family has appealed [place]  Medications     (feeding supplement) PROSource Plus  30 mL Oral BID BM   apixaban   5 mg  Oral BID   B-complex with vitamin C  1 tablet Oral Daily   carvedilol   25 mg Oral BID WC   Chlorhexidine  Gluconate Cloth  6 each Topical Q0600   cloNIDine   0.3 mg Transdermal Weekly   darbepoetin (ARANESP ) injection - DIALYSIS  100 mcg Subcutaneous Q Fri-1800   diltiazem   360 mg Oral Daily   famotidine   20 mg Oral Daily   ferrous sulfate   325 mg Oral Daily   Gerhardt's butt cream   Topical QID   insulin  aspart  0-5 Units Subcutaneous QHS   insulin  aspart  0-9 Units Subcutaneous TID WC   isosorbide  mononitrate  30 mg Oral Daily   lactose free nutrition  237 mL Oral TID WC   losartan   50 mg Oral Daily   multivitamin  1 tablet Oral QHS   sodium chloride  flush  10-40 mL Intracatheter Q12H   thiamine   100 mg Oral Daily     Data Reviewed:   CBG:  Recent Labs  Lab 06/06/24 1154 06/06/24 1634 06/06/24 2145 06/07/24 0029 06/07/24 0853  GLUCAP 78 111* 121* 117* 103*    SpO2: 100 %    Vitals:   06/06/24 1639 06/06/24 2045 06/07/24 0645 06/07/24 0740  BP: 130/65 (!) 148/58 (!) 148/78 (!) 124/55  Pulse: 68 69 74 62  Resp: 16 16 18 16   Temp:  98 F (36.7 C) 98 F (36.7 C) 97.7 F (36.5 C)  TempSrc:  Oral Oral Oral  SpO2: 95% 100% 99% 100%  Weight:      Height:          Data Reviewed:  Basic Metabolic Panel: Recent Labs  Lab 06/01/24 0322 06/02/24 0433 06/03/24 0501 06/07/24 0503  NA 136 135 134* 136  K 4.3 5.0 5.3* 4.3  CL 95* 95* 96* 95*  CO2 28 28 27 28   GLUCOSE 132* 107* 107* 107*  BUN 24* 35* 44* 30*  CREATININE 2.90* 3.63* 4.58* 4.10*  CALCIUM  7.7* 7.6* 7.7* 7.9*  MG 1.8 1.9 2.1  --   PHOS  --   --   --  4.5    CBC: Recent Labs  Lab 05/31/24 1553 06/01/24 0322 06/01/24 1636 06/02/24 0433 06/03/24 0501 06/07/24 0503  WBC 13.0* 13.3*  --  12.9* 11.5* 14.2*  NEUTROABS  --  11.5*  --  11.2* 9.8*  --   HGB 7.6* 6.9* 9.1* 9.0* 8.8* 8.1*  HCT 25.2* 22.4* 28.4* 28.4* 28.5* 26.8*  MCV 92.0 93.3  --  90.4 91.9 93.7  PLT 257 235  --  240 241 238     LFT Recent Labs  Lab 06/01/24 0322 06/02/24 0433 06/03/24 0501 06/07/24 0503  AST 15 13* 13*  --   ALT 10 10 10   --   ALKPHOS 69 63 59  --   BILITOT 0.4 0.7 0.7  --   PROT 5.3* 5.5* 5.3*  --   ALBUMIN <1.5* <1.5* 1.5* 1.6*     Antibiotics: Anti-infectives (From admission, onward)    Start     Dose/Rate Route Frequency Ordered Stop   06/04/24 1215  vancomycin  (VANCOCIN ) capsule 125 mg        125 mg Oral Daily 06/04/24 1116 06/05/24 0621   05/27/24 1000  vancomycin  (VANCOCIN ) capsule 125 mg        125 mg Oral Daily 05/21/24 1019 06/04/24 0959  05/20/24 1530  amoxicillin -clavulanate (AUGMENTIN ) 500-125 MG per tablet 1 tablet        1 tablet Oral Every 12 hours 05/20/24 1519 05/29/24 2152   05/17/24 0100  fidaxomicin  (DIFICID ) tablet 200 mg        200 mg Oral 2 times daily 05/17/24 0003 05/26/24 1037   05/17/24 0100  metroNIDAZOLE  (FLAGYL ) IVPB 500 mg  Status:  Discontinued        500 mg 100 mL/hr over 60 Minutes Intravenous Every 12 hours 05/17/24 0003 05/20/24 1519        DVT prophylaxis: Apixaban   Code Status: Full code  Family Communication: No family at bedside   CONSULTS nephrology   Subjective   Patient family concerned about facial swelling.   Objective    Physical Examination:  Appearance no acute distress, left-sided facial swelling S1-S2, regular Lungs clear to auscultation bilaterally  Status is: Inpatient:             Tracy Jennings   [facility] If 7PM-7AM, please contact night-coverage at [email or web address], Office  [phone]   06/07/2024, 12:16 PM  LOS: 22 days

## 2024-06-08 DIAGNOSIS — N824 Other female intestinal-genital tract fistulae: Secondary | ICD-10-CM | POA: Diagnosis not present

## 2024-06-08 DIAGNOSIS — I10 Essential (primary) hypertension: Secondary | ICD-10-CM | POA: Diagnosis not present

## 2024-06-08 DIAGNOSIS — F03918 Unspecified dementia, unspecified severity, with other behavioral disturbance: Secondary | ICD-10-CM | POA: Diagnosis not present

## 2024-06-08 DIAGNOSIS — A0472 Enterocolitis due to Clostridium difficile, not specified as recurrent: Secondary | ICD-10-CM | POA: Diagnosis not present

## 2024-06-08 LAB — GLUCOSE, CAPILLARY
Glucose-Capillary: 101 mg/dL — ABNORMAL HIGH (ref 70–99)
Glucose-Capillary: 95 mg/dL (ref 70–99)
Glucose-Capillary: 161 mg/dL — ABNORMAL HIGH (ref 70–99)
Glucose-Capillary: 91 mg/dL (ref 70–99)

## 2024-06-08 MED ORDER — LOPERAMIDE HCL 2 MG PO CAPS
2.0000 mg | ORAL_CAPSULE | Freq: Once | ORAL | Status: AC
  Administered 2024-06-08: 2 mg via ORAL
  Filled 2024-06-08: qty 1

## 2024-06-08 NOTE — Plan of Care (Signed)
  Problem: Education: Goal: Knowledge of General Education information will improve Description: Including pain rating scale, medication(s)/side effects and non-pharmacologic comfort measures Outcome: Progressing   Problem: Clinical Measurements: Goal: Ability to maintain clinical measurements within normal limits will improve Outcome: Progressing Goal: Will remain free from infection Outcome: Progressing Goal: Diagnostic test results will improve Outcome: Progressing Goal: Respiratory complications will improve Outcome: Progressing Goal: Cardiovascular complication will be avoided Outcome: Progressing   Problem: Activity: Goal: Risk for activity intolerance will decrease Outcome: Progressing   Problem: Coping: Goal: Level of anxiety will decrease Outcome: Progressing   Problem: Elimination: Goal: Will not experience complications related to urinary retention Outcome: Progressing   Problem: Safety: Goal: Ability to remain free from injury will improve Outcome: Progressing   Problem: Coping: Goal: Ability to adjust to condition or change in health will improve Outcome: Progressing   Problem: Metabolic: Goal: Ability to maintain appropriate glucose levels will improve Outcome: Progressing   Problem: Tissue Perfusion: Goal: Adequacy of tissue perfusion will improve Outcome: Progressing   Problem: Health Behavior/Discharge Planning: Goal: Ability to manage health-related needs will improve Outcome: Not Progressing   Problem: Nutrition: Goal: Adequate nutrition will be maintained Outcome: Not Progressing   Problem: Elimination: Goal: Will not experience complications related to bowel motility Outcome: Not Progressing   Problem: Pain Managment: Goal: General experience of comfort will improve and/or be controlled Outcome: Not Progressing   Problem: Skin Integrity: Goal: Risk for impaired skin integrity will decrease Outcome: Not Progressing   Problem:  Education: Goal: Ability to describe self-care measures that may prevent or decrease complications (Diabetes Survival Skills Education) will improve Outcome: Not Progressing Goal: Individualized Educational Video(s) Outcome: Not Progressing   Problem: Fluid Volume: Goal: Ability to maintain a balanced intake and output will improve Outcome: Not Progressing   Problem: Health Behavior/Discharge Planning: Goal: Ability to identify and utilize available resources and services will improve Outcome: Not Progressing Goal: Ability to manage health-related needs will improve Outcome: Not Progressing   Problem: Nutritional: Goal: Maintenance of adequate nutrition will improve Outcome: Not Progressing Goal: Progress toward achieving an optimal weight will improve Outcome: Not Progressing   Problem: Skin Integrity: Goal: Risk for impaired skin integrity will decrease Outcome: Not Progressing

## 2024-06-08 NOTE — Progress Notes (Signed)
 [facility]  PROGRESS NOTE  Tracy Jennings FMW:[id number] DOB: August 04, 1956 DOA: 05/16/2024 PCP: Zachary Idelia LABOR, MD   Brief HPI:   68 year old F with PMH of cognitive impairment, ESRD on HD MWF, cervical cancer, HTN, HLD, anemia and recent hospitalizations from 6/24-7/22 for hypertensive urgency and delirium, and 8/17-8/24 for C. difficile colitis for which she was treated with p.o. vancomycin  brought back to [facility] from PCP office due to weakness, nausea, diarrhea and abdominal discomfort.  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 Now medically stable.    Assessment/Plan:   C. difficile colitis ID was consulted. Was initially on oral vancomycin . Later on was on Dificid  completed for 10 days. Now back on oral vancomycin  for 7 more days.  EOT 9/24. Diarrhea appears to be improving starting 9/18. 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 Had significant abdominal pain.  Currently pain resolved. General surgery was consulted and recommend conservative management of treatment of C. difficile as well as outpatient follow-up. No indication for inpatient surgery per surgery. Tolerating oral diet.  So far no diarrhea.   Persistent leukocytosis/bandemia:  Improving.   Loculated left pleural effusion/masslike consolidation:  Seems chronic.  Has no respiratory symptoms.  IR consulted for thoracocentesis but pleural effusion felt to be small to tap safely.  Treated with Augmentin . Repeat chest x-ray in 4 to 6 weeks.   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 Currently on sliding scale insulin .   Dementia without behavioral disturbance:  Acute delirium. Appears to be confused. Minimizing pain medication. Oriented to self and family.   Failure to thrive/poor appetite/unintentional weight loss:  Significant weight loss. Hopefully oral intake will improve. Will monitor.   Uncontrolled hypertension:  Improved  Continue home clonidine  patch, losartan , Coreg , hydralazine  and Aldactone  Cardizem  increased from 180 mg daily to 360 mg daily on 9/5 Ultrafiltration per nephrology. Added low-dose Imdur . Unable to take Norvasc .  Holding off on increase losartan  due to right eye swelling.  Continue monitoring   Hyperkalemia Monitor.   Acute right IJ DVT:  Incidental finding.  Venous Doppler was ordered for left forearm swelling but showed acute right IJ DVT. Started on Eliquis    ESRD on HD MWF Nephrology following. Appreciate assistance. Continue hemodialysis as scheduled.   Anemia of renal disease:  Hemoglobin stable at around 7 right now. Folic acid  was low but within normal range. B12 also within low normal range. Will initiate supplementation. Received PRBC on 9/12.  Repeat PRBC ordered on 9/28  Follow-up H&H adequate. Will monitor.   Physical deconditioning Therapy recommended SNF.  Seems family is in agreement now   History of uterine cancer: Outpatient follow-up.   Severe malnutrition/failure to thrive/hypoalbuminemia Continue supplements.   Goals of care Palliative care consulted for goals of care.  For now holding off on further conversation.  Monitor for improvement.   Right facial swelling. --In setting of anasarca due to hypoalbuminemia -Swelling shifts once patient changes her position from left to right Family reports that the right side of her face is swollen.  Pupils are equal and reactive to light.  No tenderness on extraocular muscle movement.  No tenderness on examination.  No warmth or redness seen as well. At present this is positional only.  - Discussed with nephrology, due to anasarca.  Disposition [employer] company has refused skilled nursing  facility for rehab - Family has appealed against insurance companies  decision    Medications     (feeding supplement) PROSource Plus  30 mL Oral BID BM   apixaban   5 mg Oral BID   B-complex with vitamin C  1 tablet Oral Daily   carvedilol   25 mg Oral BID WC   Chlorhexidine  Gluconate Cloth  6 each Topical Q0600   cloNIDine   0.3 mg Transdermal Weekly   darbepoetin (ARANESP ) injection - DIALYSIS  100 mcg Subcutaneous Q Fri-1800   diltiazem   360 mg Oral Daily   famotidine   20 mg Oral Daily   ferrous sulfate   325 mg Oral Daily   Gerhardt's butt cream   Topical QID   insulin  aspart  0-5 Units Subcutaneous QHS   insulin  aspart  0-9 Units Subcutaneous TID WC   isosorbide  mononitrate  30 mg Oral Daily   lactose free nutrition  237 mL Oral TID WC   losartan   50 mg Oral Daily   multivitamin  1 tablet Oral QHS   sodium chloride  flush  10-40 mL Intracatheter Q12H   thiamine   100 mg Oral Daily     Data Reviewed:   CBG:  Recent Labs  Lab 06/06/24 2145 06/07/24 0029 06/07/24 0853 06/07/24 1222 06/07/24 2303  GLUCAP 121* 117* 103* 91 111*    SpO2: 100 %    Vitals:   06/07/24 2014 06/07/24 2044 06/07/24 2239 06/08/24 0527  BP: (!) 162/130 130/63 (!) 120/57 (!) 166/46  Pulse: 80 82 81 78  Resp: 17 15 18 19   Temp:  98.4 F (36.9 C) 98.2 F (36.8 C) 98.3 F (36.8 C)  TempSrc:  Oral Oral Oral  SpO2:  100% 100% 100%  Weight:  52.1 kg    Height:          Data Reviewed:  Basic Metabolic Panel: Recent Labs  Lab 06/02/24 0433 06/03/24 0501 06/07/24 0503  NA 135 134* 136  K 5.0 5.3* 4.3  CL 95* 96* 95*  CO2 28 27 28   GLUCOSE 107* 107* 107*  BUN 35* 44* 30*  CREATININE 3.63* 4.58* 4.10*  CALCIUM  7.6* 7.7* 7.9*  MG 1.9 2.1  --   PHOS  --   --  4.5    CBC: Recent Labs  Lab 06/01/24 1636 06/02/24 0433 06/03/24 0501 06/07/24 0503  WBC  --  12.9* 11.5* 14.2*  NEUTROABS  --  11.2* 9.8*  --   HGB 9.1* 9.0* 8.8* 8.1*  HCT 28.4* 28.4* 28.5* 26.8*  MCV  --  90.4 91.9 93.7  PLT  --  240 241 238    LFT Recent Labs  Lab  06/02/24 0433 06/03/24 0501 06/07/24 0503  AST 13* 13*  --   ALT 10 10  --   ALKPHOS 63 59  --   BILITOT 0.7 0.7  --   PROT 5.5* 5.3*  --   ALBUMIN <1.5* 1.5* 1.6*     Antibiotics: Anti-infectives (From admission, onward)    Start     Dose/Rate Route Frequency Ordered Stop   06/04/24 1215  vancomycin  (VANCOCIN ) capsule 125 mg        125 mg Oral Daily 06/04/24 1116 06/05/24 0621   05/27/24 1000  vancomycin  (VANCOCIN ) capsule 125 mg        125 mg Oral Daily 05/21/24 1019 06/04/24 0959   05/20/24 1530  amoxicillin -clavulanate (AUGMENTIN ) 500-125 MG per tablet 1 tablet        1 tablet Oral Every 12 hours  05/20/24 1519 05/29/24 2152   05/17/24 0100  fidaxomicin  (DIFICID ) tablet 200 mg        200 mg Oral 2 times daily 05/17/24 0003 05/26/24 1037   05/17/24 0100  metroNIDAZOLE  (FLAGYL ) IVPB 500 mg  Status:  Discontinued        500 mg 100 mL/hr over 60 Minutes Intravenous Every 12 hours 05/17/24 0003 05/20/24 1519        DVT prophylaxis: Apixaban   Code Status: Full code  Family Communication: No family at bedside   CONSULTS nephrology   Subjective   Patient started having loose stools this morning.  Completed treatment with vancomycin .   Objective    Physical Examination:  Appears no acute distress S1-S2, regular, no murmur auscultated Abdomen is soft, nontender Extremities no edema  Status is: Inpatient:             Sabas GORMAN Brod   [facility] If 7PM-7AM, please contact night-coverage at [email or web address], Office  [phone]   06/08/2024, 8:47 AM  LOS: 23 days

## 2024-06-08 NOTE — Progress Notes (Signed)
 Patient ID: Tracy Jennings, female   DOB: Dec 10, 1955, 68 y.o.   MRN: [id number] [facility] Progress Note   Assessment/ Plan:   1.  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  This has been complicated by C. difficile colitis for which she was on oral vancomycin  2. ESRD: Continue hemodialysis on MWF schedule with next hemodialysis tentatively scheduled for Monday 9/29 again in a recliner to verify safety/ability of her tolerating outpatient dialysis.  Will monitor through the weekend for acute concerns/indications for intervention. 3. Anemia: Low hemoglobin/hematocrit without any overt blood loss, continue to monitor with ongoing ESA therapy.   4. CKD-MBD: Calcium  and phosphorus levels are both at goal, binders on hold.  Not on VDR A. 5. Nutrition: Continue renal diet with ongoing protein supplementation to help improve caloric intake. 6. Hypertension: Blood pressure currently at goal, monitor on hemodialysis with ultrafiltration.  Subjective:   Tolerated dialysis yesterday in a recliner.  Frequency of stools decreased.   Objective:   BP (!) 167/54 (BP Location: Right Arm)   Pulse 79   Temp 98.3 F (36.8 C) (Oral)   Resp 17   Ht 5' 3 (1.6 m)   Wt 52.1 kg   LMP  (LMP Unknown)   SpO2 99%   BMI 20.35 kg/m   Physical Exam: Gen: Resting comfortably in bed, appears chronically ill CVS: Pulse regular rhythm, normal rate, S1 and S2 normal Resp: Clear to auscultation bilaterally, no distinct rales or rhonchi Abd: Soft, flat, nontender, bowel sounds normal Ext: No lower extremity edema.  Left upper arm AV graft with intact dressings  Labs: BMET Recent Labs  Lab 06/02/24 0433 06/03/24 0501 06/07/24 0503  NA 135 134* 136  K 5.0 5.3* 4.3  CL 95* 96* 95*  CO2 28 27 28   GLUCOSE 107* 107* 107*  BUN 35* 44* 30*  CREATININE 3.63* 4.58* 4.10*  CALCIUM  7.6* 7.7* 7.9*  PHOS  --   --  4.5   CBC Recent  Labs  Lab 06/01/24 1636 06/02/24 0433 06/03/24 0501 06/07/24 0503  WBC  --  12.9* 11.5* 14.2*  NEUTROABS  --  11.2* 9.8*  --   HGB 9.1* 9.0* 8.8* 8.1*  HCT 28.4* 28.4* 28.5* 26.8*  MCV  --  90.4 91.9 93.7  PLT  --  240 241 238      Medications:     (feeding supplement) PROSource Plus  30 mL Oral BID BM   apixaban   5 mg Oral BID   B-complex with vitamin C  1 tablet Oral Daily   carvedilol   25 mg Oral BID WC   Chlorhexidine  Gluconate Cloth  6 each Topical Q0600   cloNIDine   0.3 mg Transdermal Weekly   darbepoetin (ARANESP ) injection - DIALYSIS  100 mcg Subcutaneous Q Fri-1800   diltiazem   360 mg Oral Daily   famotidine   20 mg Oral Daily   ferrous sulfate   325 mg Oral Daily   Gerhardt's butt cream   Topical QID   insulin  aspart  0-5 Units Subcutaneous QHS   insulin  aspart  0-9 Units Subcutaneous TID WC   isosorbide  mononitrate  30 mg Oral Daily   lactose free nutrition  237 mL Oral TID WC   losartan   50 mg Oral Daily   multivitamin  1 tablet Oral QHS   sodium chloride  flush  10-40 mL Intracatheter Q12H   thiamine   100 mg Oral Daily   Gordy Blanch, MD 06/08/2024, 9:42  AM

## 2024-06-09 DIAGNOSIS — F03918 Unspecified dementia, unspecified severity, with other behavioral disturbance: Secondary | ICD-10-CM | POA: Diagnosis not present

## 2024-06-09 DIAGNOSIS — I10 Essential (primary) hypertension: Secondary | ICD-10-CM | POA: Diagnosis not present

## 2024-06-09 DIAGNOSIS — A0472 Enterocolitis due to Clostridium difficile, not specified as recurrent: Secondary | ICD-10-CM | POA: Diagnosis not present

## 2024-06-09 DIAGNOSIS — N824 Other female intestinal-genital tract fistulae: Secondary | ICD-10-CM | POA: Diagnosis not present

## 2024-06-09 LAB — GLUCOSE, CAPILLARY
Glucose-Capillary: 113 mg/dL — ABNORMAL HIGH (ref 70–99)
Glucose-Capillary: 121 mg/dL — ABNORMAL HIGH (ref 70–99)
Glucose-Capillary: 119 mg/dL — ABNORMAL HIGH (ref 70–99)
Glucose-Capillary: 108 mg/dL — ABNORMAL HIGH (ref 70–99)

## 2024-06-09 MED ORDER — LOPERAMIDE HCL 2 MG PO CAPS
2.0000 mg | ORAL_CAPSULE | Freq: Once | ORAL | Status: AC
Start: 2024-06-09 — End: 2024-06-09
  Administered 2024-06-09: 2 mg via ORAL
  Filled 2024-06-09: qty 1

## 2024-06-09 NOTE — Progress Notes (Signed)
 Patient ID: Tracy Jennings, female   DOB: 01-20-1956, 68 y.o.   MRN: [id number] [facility] Progress Note   Assessment/ Plan:   1.  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  This has been complicated by C. difficile colitis for which she was treated with oral vancomycin  with completion of course.  She continues to have fecal incontinence/diarrhea with associated protein wasting/hypoalbuminemia.  She has been afebrile and without evidence of infection. 2. ESRD: Continue hemodialysis on MWF schedule with next hemodialysis tentatively scheduled for tomorrow in a recliner to verify safety/ability of her tolerating outpatient dialysis. 3. Anemia: Low hemoglobin/hematocrit without any overt blood loss, continue to monitor with ongoing ESA therapy.   4. CKD-MBD: Calcium  and phosphorus levels are both at goal, binders on hold.  Not on VDR A. 5. Nutrition: She is currently on a dysphagia 2 diet and we may need to reevaluate those restriction/liberalize as appropriate to help increase caloric intake/protein. 6. Hypertension: Blood pressure currently at goal, monitor on hemodialysis with ultrafiltration.  Subjective:   Continues to have intermittent diarrhea and reportedly had a single episode of hematochezia overnight.  Nurse reports that she has perineal edema.   Objective:   BP (!) 121/53 (BP Location: Right Arm)   Pulse 70   Temp 98.6 F (37 C) (Oral)   Resp 14   Ht 5' 3 (1.6 m)   Wt 52.7 kg   LMP  (LMP Unknown)   SpO2 90%   BMI 20.58 kg/m   Physical Exam: Gen: Appears comfortable resting in bed, being assisted with medications by nurse CVS: Pulse regular rhythm, normal rate, S1 and S2 normal Resp: Clear to auscultation bilaterally, no distinct rales or rhonchi Abd: Soft, flat, nontender, bowel sounds normal Ext: No lower extremity edema.  Left upper arm AV graft with intact  dressings  Labs: BMET Recent Labs  Lab 06/03/24 0501 06/07/24 0503  NA 134* 136  K 5.3* 4.3  CL 96* 95*  CO2 27 28  GLUCOSE 107* 107*  BUN 44* 30*  CREATININE 4.58* 4.10*  CALCIUM  7.7* 7.9*  PHOS  --  4.5   CBC Recent Labs  Lab 06/03/24 0501 06/07/24 0503  WBC 11.5* 14.2*  NEUTROABS 9.8*  --   HGB 8.8* 8.1*  HCT 28.5* 26.8*  MCV 91.9 93.7  PLT 241 238      Medications:     (feeding supplement) PROSource Plus  30 mL Oral BID BM   apixaban   5 mg Oral BID   B-complex with vitamin C  1 tablet Oral Daily   carvedilol   25 mg Oral BID WC   Chlorhexidine  Gluconate Cloth  6 each Topical Q0600   cloNIDine   0.3 mg Transdermal Weekly   darbepoetin (ARANESP ) injection - DIALYSIS  100 mcg Subcutaneous Q Fri-1800   diltiazem   360 mg Oral Daily   famotidine   20 mg Oral Daily   ferrous sulfate   325 mg Oral Daily   Gerhardt's butt cream   Topical QID   insulin  aspart  0-5 Units Subcutaneous QHS   insulin  aspart  0-9 Units Subcutaneous TID WC   isosorbide  mononitrate  30 mg Oral Daily   lactose free nutrition  237 mL Oral TID WC   losartan   50 mg Oral Daily   multivitamin  1 tablet Oral QHS   sodium chloride  flush  10-40 mL Intracatheter Q12H   thiamine   100 mg Oral Daily   Gordy Blanch, MD  06/09/2024, 10:05 AM

## 2024-06-09 NOTE — Plan of Care (Signed)

## 2024-06-09 NOTE — Progress Notes (Signed)
 [facility]  PROGRESS NOTE  Tracy Jennings FMW:[id number] DOB: December 07, 1955 DOA: 05/16/2024 PCP: Zachary Idelia LABOR, MD   Brief HPI:   68 year old F with PMH of cognitive impairment, ESRD on HD MWF, cervical cancer, HTN, HLD, anemia and recent hospitalizations from 6/24-7/22 for hypertensive urgency and delirium, and 8/17-8/24 for C. difficile colitis for which she was treated with p.o. vancomycin  brought back to [facility] from PCP office due to weakness, nausea, diarrhea and abdominal discomfort.  Was brought to [facility] for concern for Colo uterine fistula. Now medically stable.    Assessment/Plan:   C. difficile colitis ID was consulted. Was initially on oral vancomycin . Later on was on Dificid  completed for 10 days. Now back on oral vancomycin  for 7 more days.  EOT 9/24. Diarrhea appears to be improving starting 9/18. Developed loose stools yesterday, received 1 dose of Imodium.  Some improvement -Amlodipine  dose of Imodium 2 mg today.   Colouterine fistula reason for transfer from [facility] to [facility]. Had significant abdominal pain.  Currently pain resolved. General surgery was consulted and recommend conservative management of treatment of C. difficile as well as outpatient follow-up. No indication for inpatient surgery per surgery. Tolerating oral diet.   Diarrhea started as above. Hello Persistent leukocytosis/bandemia:  Improving.   Loculated left pleural effusion/masslike consolidation:  Seems chronic.  Has no respiratory symptoms.  IR consulted for thoracocentesis but pleural effusion felt to be small to tap safely.  Treated with Augmentin . Repeat chest x-ray in 4 to 6 weeks.   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 Currently on sliding scale insulin .   Dementia without behavioral disturbance:  Acute delirium. Appears to be confused. Minimizing pain medication. Oriented to self and family.    Failure to thrive/poor appetite/unintentional weight loss: Significant weight loss. Hopefully oral intake will improve. Will monitor.   Uncontrolled hypertension:  Improved  Continue home clonidine  patch, losartan , Coreg , hydralazine  and Aldactone  Cardizem  increased from 180 mg daily to 360 mg daily on 9/5 Ultrafiltration per nephrology. Added low-dose Imdur . Unable to take Norvasc .  Holding off on increase losartan  due to right eye swelling.  Continue monitoring   Hyperkalemia Monitor.   Acute right IJ DVT:  Incidental finding.  Venous Doppler was ordered for left forearm swelling but showed acute right IJ DVT. Started on Eliquis    ESRD on HD MWF Nephrology following. Appreciate assistance. Continue hemodialysis as scheduled.   Anemia of renal disease:  Hemoglobin stable at around 7 right now. Folic acid  was low but within normal range. B12 also within low normal range. Will initiate supplementation. Received PRBC on 9/12.  Repeat PRBC ordered on 9/28  Follow-up H&H adequate. Will monitor.   Physical deconditioning Therapy recommended SNF.  Seems family is in agreement now   History of uterine cancer: Outpatient follow-up.   Severe malnutrition/failure to thrive/hypoalbuminemia Continue supplements.   Goals of care Palliative care consulted for goals of care.  For now holding off on further conversation.  Monitor for improvement.   Right facial swelling. --In setting of anasarca due to hypoalbuminemia -Swelling shifts once patient changes her position from left to right Family reports that the right side of her face is swollen.  Pupils are equal and reactive to light.  No tenderness on extraocular muscle movement.  No tenderness on examination.  No warmth or redness seen as well. At present this is positional only.  - Discussed with nephrology, due to anasarca.  Disposition -  Insurance company has refused skilled nursing facility for rehab - Family has  appealed [employer]    Medications     (feeding supplement) PROSource Plus  30 mL Oral BID BM   apixaban   5 mg Oral BID   B-complex with vitamin C  1 tablet Oral Daily   carvedilol   25 mg Oral BID WC   Chlorhexidine  Gluconate Cloth  6 each Topical Q0600   cloNIDine   0.3 mg Transdermal Weekly   darbepoetin (ARANESP ) injection - DIALYSIS  100 mcg Subcutaneous Q Fri-1800   diltiazem   360 mg Oral Daily   famotidine   20 mg Oral Daily   ferrous sulfate   325 mg Oral Daily   Gerhardt's butt cream   Topical QID   insulin  aspart  0-5 Units Subcutaneous QHS   insulin  aspart  0-9 Units Subcutaneous TID WC   isosorbide  mononitrate  30 mg Oral Daily   lactose free nutrition  237 mL Oral TID WC   losartan   50 mg Oral Daily   multivitamin  1 tablet Oral QHS   sodium chloride  flush  10-40 mL Intracatheter Q12H   thiamine   100 mg Oral Daily     Data Reviewed:   CBG:  Recent Labs  Lab 06/08/24 0850 06/08/24 1223 06/08/24 1633 06/08/24 2244 06/09/24 0803  GLUCAP 101* 95 161* 91 113*    SpO2: 90 %    Vitals:   06/08/24 1610 06/08/24 2040 06/09/24 0500 06/09/24 0807  BP: 131/61 (!) 172/50 (!) 118/59 (!) 121/53  Pulse: 85 80 75 70  Resp: 17 16 16 14   Temp: 97.9 F (36.6 C) 98.3 F (36.8 C) 98.3 F (36.8 C) 98.6 F (37 C)  TempSrc: Oral Oral Oral Oral  SpO2: 100% 99% 98% 90%  Weight:   52.7 kg   Height:          Data Reviewed:  Basic Metabolic Panel: Recent Labs  Lab 06/03/24 0501 06/07/24 0503  NA 134* 136  K 5.3* 4.3  CL 96* 95*  CO2 27 28  GLUCOSE 107* 107*  BUN 44* 30*  CREATININE 4.58* 4.10*  CALCIUM  7.7* 7.9*  MG 2.1  --   PHOS  --  4.5    CBC: Recent Labs  Lab 06/03/24 0501 06/07/24 0503  WBC 11.5* 14.2*  NEUTROABS 9.8*  --   HGB 8.8* 8.1*  HCT 28.5* 26.8*  MCV 91.9 93.7  PLT 241 238    LFT Recent Labs  Lab 06/03/24 0501 06/07/24 0503  AST 13*  --   ALT 10  --   ALKPHOS 59  --   BILITOT 0.7  --   PROT  5.3*  --   ALBUMIN 1.5* 1.6*     Antibiotics: Anti-infectives (From admission, onward)    Start     Dose/Rate Route Frequency Ordered Stop   06/04/24 1215  vancomycin  (VANCOCIN ) capsule 125 mg        125 mg Oral Daily 06/04/24 1116 06/05/24 0621   05/27/24 1000  vancomycin  (VANCOCIN ) capsule 125 mg        125 mg Oral Daily 05/21/24 1019 06/04/24 0959   05/20/24 1530  amoxicillin -clavulanate (AUGMENTIN ) 500-125 MG per tablet 1 tablet        1 tablet Oral Every 12 hours 05/20/24 1519 05/29/24 2152   05/17/24 0100  fidaxomicin  (DIFICID ) tablet 200 mg        200 mg Oral 2 times daily 05/17/24 0003 05/26/24 1037   05/17/24 0100  metroNIDAZOLE  (  FLAGYL ) IVPB 500 mg  Status:  Discontinued        500 mg 100 mL/hr over 60 Minutes Intravenous Every 12 hours 05/17/24 0003 05/20/24 1519        DVT prophylaxis: Apixaban   Code Status: Full code  Family Communication: No family at bedside   CONSULTS nephrology   Subjective   Diarrhea has improved.  Still having loose stools through vagina.   Objective    Physical Examination:  General-appears in no acute distress Heart-S1-S2, regular, no murmur auscultated Lungs-clear to auscultation bilaterally, no wheezing or crackles auscultated Abdomen-soft, nontender, no organomegaly Extremities-no edema in the lower extremities Neuro-alert, oriented x3, no focal deficit noted  Status is: Inpatient:             Sabas GORMAN Brod   [facility] If 7PM-7AM, please contact night-coverage at [email or web address], Office  [phone]   06/09/2024, 8:32 AM  LOS: 24 days

## 2024-06-10 DIAGNOSIS — I10 Essential (primary) hypertension: Secondary | ICD-10-CM | POA: Diagnosis not present

## 2024-06-10 DIAGNOSIS — F03918 Unspecified dementia, unspecified severity, with other behavioral disturbance: Secondary | ICD-10-CM | POA: Diagnosis not present

## 2024-06-10 DIAGNOSIS — A0472 Enterocolitis due to Clostridium difficile, not specified as recurrent: Secondary | ICD-10-CM | POA: Diagnosis not present

## 2024-06-10 DIAGNOSIS — N824 Other female intestinal-genital tract fistulae: Secondary | ICD-10-CM | POA: Diagnosis not present

## 2024-06-10 LAB — GLUCOSE, CAPILLARY
Glucose-Capillary: 98 mg/dL (ref 70–99)
Glucose-Capillary: 89 mg/dL (ref 70–99)
Glucose-Capillary: 85 mg/dL (ref 70–99)
Glucose-Capillary: 112 mg/dL — ABNORMAL HIGH (ref 70–99)
Glucose-Capillary: 94 mg/dL (ref 70–99)

## 2024-06-10 MED ORDER — DARBEPOETIN ALFA 100 MCG/0.5ML IJ SOSY
100.0000 ug | PREFILLED_SYRINGE | INTRAMUSCULAR | Status: DC
  Administered 2024-06-10: 100 ug via SUBCUTANEOUS
  Filled 2024-06-10: qty 0.5

## 2024-06-10 NOTE — Plan of Care (Signed)
     Referral previously received for Tracy Jennings for goals of care discussion. Noted most recent palliative in-person assessment dated 06/01/2024 at which time it was recommended to back off at that time and feel free to notify for any significant changes or needs.  However, the patient remains admitted over a week later and so we decided to chart check for any significant changes or needs.  Chart reviewed for Recent provider notes, nurse notes, TOC notes, vitals, and labs and updates received from RN.   At this time patient appears stable.  It appears primary hurdle now is disposition issue with insurance denying SNF/rehab.  Family is appealing this decision.  Additionally, due to possibility of patient being discharged home if SNF/rehab continues to be denied by insurance, nephrology is assessing for chair dialysis today which would be needed if she discharges home.   No plan for in person follow-up today.  Will chart check tomorrow for tolerance of HD and any significant changes or new needs at that time.  Please contact the palliative medicine provider on service for any new/urgent needs that require our assistance with this patient.  Thank you for your referral and allowing PMT to assist in Tracy Jennings's care.   [name], NP Palliative Medicine Team Phone: [phone]  NO CHARGE

## 2024-06-10 NOTE — Progress Notes (Signed)
 [facility] Progress Note   Subjective:  Seen in room - c/o being cold. Tells me she is eating. For HD today - plan is in recliner if possible.   Objective Vitals:   06/09/24 1005 06/09/24 1619 06/10/24 0526 06/10/24 0902  BP: (!) 121/53 (!) 148/55 (!) 121/54 104/62  Pulse:  78 78 64  Resp:  15 16 16   Temp:  98.6 F (37 C) 98.8 F (37.1 C) 98.3 F (36.8 C)  TempSrc:  Oral Oral Oral  SpO2:  100% 100%   Weight:      Height:       Physical Exam General: Frail woman, NAD. Room air Heart: RRR Lungs: CTAB Extremities: No  LE edema Dialysis Access: L AVG    Additional Objective Labs: Basic Metabolic Panel: Recent Labs  Lab 06/07/24 0503  NA 136  K 4.3  CL 95*  CO2 28  GLUCOSE 107*  BUN 30*  CREATININE 4.10*  CALCIUM  7.9*  PHOS 4.5   Liver Function Tests: Recent Labs  Lab 06/07/24 0503  ALBUMIN 1.6*   CBC: Recent Labs  Lab 06/07/24 0503  WBC 14.2*  HGB 8.1*  HCT 26.8*  MCV 93.7  PLT 238   Medications:   (feeding supplement) PROSource Plus  30 mL Oral BID BM   apixaban   5 mg Oral BID   B-complex with vitamin C  1 tablet Oral Daily   carvedilol   25 mg Oral BID WC   Chlorhexidine  Gluconate Cloth  6 each Topical Q0600   cloNIDine   0.3 mg Transdermal Weekly   darbepoetin (ARANESP ) injection - DIALYSIS  100 mcg Subcutaneous Q Mon-1800   diltiazem   360 mg Oral Daily   famotidine   20 mg Oral Daily   ferrous sulfate   325 mg Oral Daily   Gerhardt's butt cream   Topical QID   insulin  aspart  0-5 Units Subcutaneous QHS   insulin  aspart  0-9 Units Subcutaneous TID WC   isosorbide  mononitrate  30 mg Oral Daily   lactose free nutrition  237 mL Oral TID WC   losartan   50 mg Oral Daily   multivitamin  1 tablet Oral QHS   sodium chloride  flush  10-40 mL Intracatheter Q12H   thiamine   100 mg Oral Daily    Dialysis Orders MWF [facility]  3.5hrs, 400/500, EDW 53kg, 2K/2.5Ca bath, AVG, heparin  6000 unit bolus + 400u/hr pump -  Calcitriol  1.41mcg with HD - Sensipar 60mg  with HD - Micera 75mcg Q 4 weeks - per records, dose just ordered (not given) - Binder: Calcium  Acetate 667mh TID with meals   Assessment/Plan: Colo-uterine fistula: per abd CT. Appears surgery has signed off and plans outpt elective management;  C-diff colitis: s/p course of po vancomycin .  LUE edema: LUE duplex ordered by Hospitalist - no issue with LUE but acute non occlusive RIJ clot noted - started on eliquis .  No issues with AVG thus far. Monitor. ESRD: on HD MWF.  For HD today. HTN/volume: HTN on 5 BP agents, BP lower here - have stopped spironolactone . Continue volume lowering with HD. BP improving.   Anemia of ESRD: Hgb 8.1 - s/p 1 U pRBCs 9/20. Continue Aranesp  q Monday this admit.   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 Dementia Malnutrition: NG tube placed, TF started 9/10 - stopped 9/14 (exacerbated diarrhea), NG tube now out. Eating better-ish. GOC: Palliative consulted -  ongoing loose stools, hospice considered but now eating a  little better, so family has elected to stay the course, full scope for now. Technically tolerates HD, but doubt it is contributing to QOL. SNF recommended, insurance denied, family is appealing this.   Tracy Boehringer, PA-C 06/10/2024, 1:12 PM  [employer]

## 2024-06-10 NOTE — Plan of Care (Signed)
   Problem: Education: Goal: Knowledge of General Education information will improve Description Including pain rating scale, medication(s)/side effects and non-pharmacologic comfort measures Outcome: Progressing   Problem: Health Behavior/Discharge Planning: Goal: Ability to manage health-related needs will improve Outcome: Progressing

## 2024-06-10 NOTE — Progress Notes (Signed)
 [facility]  PROGRESS NOTE  Tracy Jennings FMW:[id number] DOB: 08-23-1956 DOA: 05/16/2024 PCP: Zachary Idelia LABOR, MD   Brief HPI:   68 year old F with PMH of cognitive impairment, ESRD on HD MWF, cervical cancer, HTN, HLD, anemia and recent hospitalizations from 6/24-7/22 for hypertensive urgency and delirium, and 8/17-8/24 for C. difficile colitis for which she was treated with p.o. vancomycin  brought back to [facility] from PCP office due to weakness, nausea, diarrhea and abdominal discomfort.  Was brought to [facility] for concern for Colo uterine fistula. Now medically stable.    Assessment/Plan:   C. difficile colitis ID was consulted. Was initially on oral vancomycin . Later on was on Dificid  completed for 10 days. Now back on oral vancomycin  for 7 more days.  EOT 9/24. Diarrhea appears to be improving starting 9/18. Developed loose stools yesterday, received 1 dose of Imodium.  Some improvement - Was given a dose of Imodium 2 mg x 1 yesterday -Reconsult ID for further recommendations for recurrent C. difficile colitis   Colouterine fistula reason for transfer from [facility] to [facility]. Had significant abdominal pain.  Currently pain resolved. General surgery was consulted and recommend conservative management of treatment of C. difficile as well as outpatient follow-up. No indication for inpatient surgery per surgery. Tolerating oral diet.   Diarrhea started as above.  Persistent leukocytosis/bandemia:  Improving.   Loculated left pleural effusion/masslike consolidation:  Seems chronic.  Has no respiratory symptoms.  IR consulted for thoracocentesis but pleural effusion felt to be small to tap safely.  Treated with Augmentin . Repeat chest x-ray in 4 to 6 weeks.   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 Currently on sliding scale insulin .   Dementia without behavioral disturbance:  Acute delirium. Appears  to be confused. Minimizing pain medication. Oriented to self and family.   Failure to thrive/poor appetite/unintentional weight loss: Significant weight loss. Hopefully oral intake will improve. Will monitor.   Uncontrolled hypertension:  Improved  Continue home clonidine  patch, losartan , Coreg , hydralazine  and Aldactone  Cardizem  increased from 180 mg daily to 360 mg daily on 9/5 Ultrafiltration per nephrology. Added low-dose Imdur . Unable to take Norvasc .  Holding off on increase losartan  due to right eye swelling.  Continue monitoring   Hyperkalemia Monitor.   Acute right IJ DVT:  Incidental finding.  Venous Doppler was ordered for left forearm swelling but showed acute right IJ DVT. Started on Eliquis    ESRD on HD MWF Nephrology following. Appreciate assistance. Continue hemodialysis as scheduled.   Anemia of renal disease:  Hemoglobin stable at around 7 right now. Folic acid  was low but within normal range. B12 also within low normal range. Will initiate supplementation. Received PRBC on 9/12.  Repeat PRBC ordered on 9/28  Follow-up H&H adequate. Will monitor.   Physical deconditioning Therapy recommended SNF.  Seems family is in agreement now   History of uterine cancer: Outpatient follow-up.   Severe malnutrition/failure to thrive/hypoalbuminemia Continue supplements.   Goals of care Palliative care consulted for goals of care.  For now holding off on further conversation.  Monitor for improvement.   Right facial swelling. --In setting of anasarca due to hypoalbuminemia -Swelling shifts once patient changes her position from left to right Family reports that the right side of her face is swollen.  Pupils are equal and reactive to light.  No tenderness on extraocular muscle movement.  No tenderness on examination.  No warmth or redness seen as well. At present  this is positional only.  - Discussed with nephrology, due to anasarca.  Disposition -  Insurance company has refused skilled nursing facility for rehab - Family has appealed against insurance companies decision    Medications     (feeding supplement) PROSource Plus  30 mL Oral BID BM   apixaban   5 mg Oral BID   B-complex with vitamin C  1 tablet Oral Daily   carvedilol   25 mg Oral BID WC   Chlorhexidine  Gluconate Cloth  6 each Topical Q0600   cloNIDine   0.3 mg Transdermal Weekly   darbepoetin (ARANESP ) injection - DIALYSIS  100 mcg Subcutaneous Q Mon-1800   diltiazem   360 mg Oral Daily   famotidine   20 mg Oral Daily   ferrous sulfate   325 mg Oral Daily   Gerhardt's butt cream   Topical QID   insulin  aspart  0-5 Units Subcutaneous QHS   insulin  aspart  0-9 Units Subcutaneous TID WC   isosorbide  mononitrate  30 mg Oral Daily   lactose free nutrition  237 mL Oral TID WC   losartan   50 mg Oral Daily   multivitamin  1 tablet Oral QHS   sodium chloride  flush  10-40 mL Intracatheter Q12H   thiamine   100 mg Oral Daily     Data Reviewed:   CBG:  Recent Labs  Lab 06/08/24 2244 06/09/24 0803 06/09/24 1227 06/09/24 1814 06/09/24 2113  GLUCAP 91 113* 121* 119* 108*    SpO2: 100 %    Vitals:   06/09/24 0807 06/09/24 1005 06/09/24 1619 06/10/24 0526  BP: (!) 121/53 (!) 121/53 (!) 148/55 (!) 121/54  Pulse: 70  78 78  Resp: 14  15 16   Temp: 98.6 F (37 C)  98.6 F (37 C) 98.8 F (37.1 C)  TempSrc: Oral  Oral Oral  SpO2: 90%  100% 100%  Weight:      Height:          Data Reviewed:  Basic Metabolic Panel: Recent Labs  Lab 06/07/24 0503  NA 136  K 4.3  CL 95*  CO2 28  GLUCOSE 107*  BUN 30*  CREATININE 4.10*  CALCIUM  7.9*  PHOS 4.5    CBC: Recent Labs  Lab 06/07/24 0503  WBC 14.2*  HGB 8.1*  HCT 26.8*  MCV 93.7  PLT 238    LFT Recent Labs  Lab 06/07/24 0503  ALBUMIN 1.6*     Antibiotics: Anti-infectives (From admission, onward)    Start     Dose/Rate Route Frequency Ordered Stop   06/04/24 1215  vancomycin  (VANCOCIN )  capsule 125 mg        125 mg Oral Daily 06/04/24 1116 06/05/24 0621   05/27/24 1000  vancomycin  (VANCOCIN ) capsule 125 mg        125 mg Oral Daily 05/21/24 1019 06/04/24 0959   05/20/24 1530  amoxicillin -clavulanate (AUGMENTIN ) 500-125 MG per tablet 1 tablet        1 tablet Oral Every 12 hours 05/20/24 1519 05/29/24 2152   05/17/24 0100  fidaxomicin  (DIFICID ) tablet 200 mg        200 mg Oral 2 times daily 05/17/24 0003 05/26/24 1037   05/17/24 0100  metroNIDAZOLE  (FLAGYL ) IVPB 500 mg  Status:  Discontinued        500 mg 100 mL/hr over 60 Minutes Intravenous Every 12 hours 05/17/24 0003 05/20/24 1519        DVT prophylaxis: Apixaban   Code Status: Full code  Family Communication: No family at bedside  CONSULTS nephrology   Subjective   Now having persistent diarrhea.   Objective    Physical Examination:  Appears in no acute distress Abdomen is soft, tender to palpation Extremities no edema Lungs clear to auscultation bilaterally  Status is: Inpatient:             Sabas GORMAN Brod   [facility] If 7PM-7AM, please contact night-coverage at [email or web address], Office  [phone]   06/10/2024, 8:23 AM  LOS: 25 days

## 2024-06-11 ENCOUNTER — Inpatient Hospital Stay (HOSPITAL_COMMUNITY)

## 2024-06-11 DIAGNOSIS — N824 Other female intestinal-genital tract fistulae: Secondary | ICD-10-CM | POA: Diagnosis not present

## 2024-06-11 DIAGNOSIS — Z8742 Personal history of other diseases of the female genital tract: Secondary | ICD-10-CM

## 2024-06-11 DIAGNOSIS — K921 Melena: Secondary | ICD-10-CM

## 2024-06-11 DIAGNOSIS — R197 Diarrhea, unspecified: Secondary | ICD-10-CM

## 2024-06-11 DIAGNOSIS — N186 End stage renal disease: Secondary | ICD-10-CM | POA: Diagnosis not present

## 2024-06-11 DIAGNOSIS — D649 Anemia, unspecified: Secondary | ICD-10-CM

## 2024-06-11 DIAGNOSIS — A0472 Enterocolitis due to Clostridium difficile, not specified as recurrent: Secondary | ICD-10-CM | POA: Diagnosis not present

## 2024-06-11 DIAGNOSIS — I10 Essential (primary) hypertension: Secondary | ICD-10-CM | POA: Diagnosis not present

## 2024-06-11 LAB — CBC WITH DIFFERENTIAL/PLATELET
Abs Immature Granulocytes: 0.17 K/uL — ABNORMAL HIGH (ref 0.00–0.07)
Abs Immature Granulocytes: 0.17 K/uL — ABNORMAL HIGH (ref 0.00–0.07)
Basophils Absolute: 0.1 K/uL (ref 0.0–0.1)
Basophils Absolute: 0.1 K/uL (ref 0.0–0.1)
Basophils Relative: 1 %
Basophils Relative: 1 %
Eosinophils Absolute: 0.4 K/uL (ref 0.0–0.5)
Eosinophils Absolute: 0.3 K/uL (ref 0.0–0.5)
Eosinophils Relative: 3 %
Eosinophils Relative: 2 %
HCT: 23.5 % — ABNORMAL LOW (ref 36.0–46.0)
HCT: 22.2 % — ABNORMAL LOW (ref 36.0–46.0)
Hemoglobin: 7.1 g/dL — ABNORMAL LOW (ref 12.0–15.0)
Hemoglobin: 6.8 g/dL — CL (ref 12.0–15.0)
Immature Granulocytes: 1 %
Immature Granulocytes: 1 %
Lymphocytes Relative: 4 %
Lymphocytes Relative: 3 %
Lymphs Abs: 0.6 K/uL — ABNORMAL LOW (ref 0.7–4.0)
Lymphs Abs: 0.6 K/uL — ABNORMAL LOW (ref 0.7–4.0)
MCH: 29.2 pg (ref 26.0–34.0)
MCH: 29.4 pg (ref 26.0–34.0)
MCHC: 30.2 g/dL (ref 30.0–36.0)
MCHC: 30.6 g/dL (ref 30.0–36.0)
MCV: 96.7 fL (ref 80.0–100.0)
MCV: 96.1 fL (ref 80.0–100.0)
Monocytes Absolute: 1.2 K/uL — ABNORMAL HIGH (ref 0.1–1.0)
Monocytes Absolute: 1.6 K/uL — ABNORMAL HIGH (ref 0.1–1.0)
Monocytes Relative: 9 %
Monocytes Relative: 9 %
Neutro Abs: 11.9 K/uL — ABNORMAL HIGH (ref 1.7–7.7)
Neutro Abs: 14.2 K/uL — ABNORMAL HIGH (ref 1.7–7.7)
Neutrophils Relative %: 82 %
Neutrophils Relative %: 84 %
Platelets: 249 K/uL (ref 150–400)
Platelets: 234 K/uL (ref 150–400)
RBC: 2.43 MIL/uL — ABNORMAL LOW (ref 3.87–5.11)
RBC: 2.31 MIL/uL — ABNORMAL LOW (ref 3.87–5.11)
RDW: 21.7 % — ABNORMAL HIGH (ref 11.5–15.5)
RDW: 22.5 % — ABNORMAL HIGH (ref 11.5–15.5)
Smear Review: NORMAL
WBC: 14.3 K/uL — ABNORMAL HIGH (ref 4.0–10.5)
WBC: 16.9 K/uL — ABNORMAL HIGH (ref 4.0–10.5)
nRBC: 0 % (ref 0.0–0.2)
nRBC: 0 % (ref 0.0–0.2)

## 2024-06-11 LAB — PREPARE RBC (CROSSMATCH)

## 2024-06-11 LAB — RENAL FUNCTION PANEL
Albumin: 1.7 g/dL — ABNORMAL LOW (ref 3.5–5.0)
Anion gap: 12 (ref 5–15)
BUN: 46 mg/dL — ABNORMAL HIGH (ref 8–23)
CO2: 27 mmol/L (ref 22–32)
Calcium: 8.2 mg/dL — ABNORMAL LOW (ref 8.9–10.3)
Chloride: 100 mmol/L (ref 98–111)
Creatinine, Ser: 5.43 mg/dL — ABNORMAL HIGH (ref 0.44–1.00)
GFR, Estimated: 8 mL/min — ABNORMAL LOW (ref >60)
Glucose, Bld: 98 mg/dL (ref 70–99)
Phosphorus: 3.9 mg/dL (ref 2.5–4.6)
Potassium: 4.7 mmol/L (ref 3.5–5.1)
Sodium: 139 mmol/L (ref 135–145)

## 2024-06-11 LAB — GLUCOSE, CAPILLARY
Glucose-Capillary: 78 mg/dL (ref 70–99)
Glucose-Capillary: 88 mg/dL (ref 70–99)
Glucose-Capillary: 140 mg/dL — ABNORMAL HIGH (ref 70–99)

## 2024-06-11 MED ORDER — IOHEXOL 350 MG/ML SOLN
75.0000 mL | Freq: Once | INTRAVENOUS | Status: AC | PRN
  Administered 2024-06-11: 75 mL via INTRAVENOUS

## 2024-06-11 MED ORDER — SODIUM CHLORIDE 0.9% IV SOLUTION: Freq: Once | INTRAVENOUS | Status: AC

## 2024-06-11 NOTE — Progress Notes (Addendum)
 Patient is known to surgery after we were consulted earlier in her stay after discovery of a colo-uterine fistula in the setting of c.diff colitis. No urgent intervention was indicated and it was recommended that she follow up as an outpatient.  I received a page today regarding new onset rectal and vaginal bleeding. I arrived at bedside to evaluate.  On exam, patient is resting comfortably in bed. Her ability to provide history appears limited by her dementia. Per family and nursing at the bedside, she had been complaining of lower abdominal pain earlier today and then after dialysis she was noted to have a few clots coming from her vagina. She later developed a more steady stream of blood from the vagina/rectum that has since slowed. Her last Hb was 7.1 early this morning. No recent vitals in the chart.  Her abdomen is soft, non-distended, and she does not respond to deep palpation.  A/P: 68 y/o F with a colo-uterine fistula in the setting of recent C.Diff colitis and history of cervical cancer s/p brachytherapy.  - No indication for urgent surgical intervention. She appears stable at this time and without signs of peritonitis - Would recommend STAT H&H with transfusion as indicated - Can consider CTA to localize if there are signs of ongoing bleeding - If CTA shows signs of active bleeding then IR should be consulted to evaluate for embolization - Patient should be in a closely monitored setting with frequent vital checks - Surgery will continue to follow  Cordella Idler, MD   General Surgeon [facility], [place]

## 2024-06-11 NOTE — Plan of Care (Signed)
     Referral previously received for Tracy Jennings for goals of care discussion. Noted most recent palliative in-person assessment dated 06/01/2024 at which time it was recommended to back off at that time and feel free to notify for any significant changes or needs.  See chart check note from yesterday when patient appears to be Tracy Jennings disposition issue with insurance denying//rehab, plan to test for chair stability for dialysis given possibility of discharge home..   Chart reviewed for Recent provider notes, nurse notes, vitals, and labs.    At this time patient appears stable.  The patient's dialysis was delayed until today, unfortunately per nephrology PA patient and bedside nurse refused to get her into a chair to trial chair dialysis.  At this time goals are clear.  May possibly need revisitation of goals of care if she does not tolerate chair dialysis.  Discussed with the medical team importance of trialing chair dialysis due to possibility of home discharge and outpatient dialysis with need to revisit goals of care if outpatient dialysis no longer possible.   No plan for in person follow-up today.  Will chart check in a couple days for updated status.   Thank you for your referral and allowing PMT to assist in Tracy Jennings's care.    [name], NP Palliative Medicine Team Phone: [phone]   NO CHARGE

## 2024-06-11 NOTE — Procedures (Signed)
 HD Note:  Some information was entered later than the data was gathered due to patient care needs. The stated time with the data is accurate.  Received patient in bed to unit.   Alert and oriented to self.  She appears to be aware that she is on dialysis treatment but not consistently aware of where she is.   Informed consent signed and in chart.   Access used: Left upper arm fistula Access issues: None  Patient began to ask for food around the 2 hour mark.  Patient is ordered puree diet with thin liquids.  The unit is not able to provide pureed food.  Liquid supplement was attempted with Patient taking a couple of sips and refusing to take more.   TX duration:  Alert, without acute distress.  Total UF removed: 1500 ml  Hand-off given to patient's nurse.   Transported back to the room   Tracy Dung L. Lenon, RN [facility] Unit.

## 2024-06-11 NOTE — Consult Note (Addendum)
 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  Today started passing blood with clots from vagina and rectum ( on Eliquis ) Unclear where source of bleeding is though presumably coming from the colon. Unusual to develop fistula with acute bowel inflammation. Bleeding from colitis ( infectious, IBD) ? Stercoral colitis given findings of large volume stool on CT scan on admission. Diverticular  hemorrhage?  Also, she has a history of cervical cancer,  s/p high brachytherapy in 2021.  Status of that cancer?   ESRD on HD  Loculated left pleural effusion/masslike consolidation:  Has no respiratory symptoms.  IR consulted for thoracocentesis but pleural effusion felt to be small to tap safely.  Treated with Augmentin . Repeat chest x-ray in 4 to 6 weeks.  Hypertension  Dementia  History of cervical cancer  See PMH for any additional medical history  / medical problems  Principal Problem:   Colouterine fistula Active Problems:   Chronic recurrent pancreatitis (HCC)   ESRD (end stage renal disease) (HCC)   Type 2 diabetes mellitus with other specified complication (HCC)   Essential hypertension   Cervical cancer (HCC)   Dementia with behavioral disturbance (HCC)   C. difficile colitis   PVD (peripheral vascular disease)   Fistula   PLAN:   --Stat CBC already ordered.  --May need CT angio if continues to bleed. --Reconsult Surgery at this point?    HPI   Patient has dementia, she is a poor historian.  Brother and sister in the room and help provide history.   Patient hospitalized in  August with C-diff. Treated with oral Vanco. Discharged home 05/05/24. Returned to [facility] ED on 9/4 with abdominal pain.  CT scan  showed circumferential wall thickening of the rectosigmoid colon suspicious for colitis,  a colo-uterine fistula, large defect in the posterior uterus which contains stool, and air in the endometrial canal. Surgeon at [facility] recommended referral to a tertiary care center for higher level of care.  Patient was transferred to [facility].  She was started on Dificid  and ( ? Flagyl ). She was seen by surgery on 05/17/24 and it was felt that the [facility]- uterine fistula could be managed electively and that there was no urgent surgical intervention needed.  They signed off case on 05/20/2024 with plans for outpatient follow-up.  Infectious diseases also became involved earlier this admission for persistent leukocytosis in the setting of not only Colo uterine fistula but a pleural effusion (concerning for a mass) for which patient was given Augmentin .  This admission she completed vancomycin  and Dificid .  For a period of time she was put back on oral vancomycin  for ongoing diarrhea.  She has been passing stool through the vagina. Eventually the diarrhea improved with Imodium.  Then patient began having recurrent diarrhea.  Infectious disease was reconsulted (today) and recommended conservative Imodium.  This afternoon patient began passing blood from her rectum and also vaginally.  Again I am unable to get much meaningful history from the patient.  The RN tells me she was complaining earlier of lower abdominal pain in both lower quadrants but sounds like this is the same pain she was admitted to the hospital for (per family at bedside).  I did see a photo of the blood which was pretty bright red with some clots.   Labs this am: WBC 14.3, up from 11 last week Hemoglobin 7.1 around midnight (prior to start of GI bleed) BUN 46  (up from 30 overnight) Creatinine 5.43  Patient has been on Eliquis  which has  now been placed on   Labs and Imaging:  Recent Labs    06/11/24 0028  ALBUMIN 1.7*   Recent Labs    06/11/24 0028  WBC 14.3*  HGB 7.1*  HCT 23.5*  MCV 96.7  PLT 249   Recent Labs    06/11/24 0028  NA 139  K 4.7  CL 100  CO2 27  GLUCOSE 98  BUN 46*  CREATININE 5.43*  CALCIUM  8.2*     DG Abd Portable 1V CLINICAL DATA:  [id number] Encounter for feeding tube placement [id number]  EXAM: PORTABLE ABDOMEN - 1 VIEW  COMPARISON:  05/16/2024.  FINDINGS: The bowel gas pattern is non-obstructive.  No evidence of pneumoperitoneum, within the limitations of a supine film.  No acute osseous abnormalities.  There is small left pleural effusion blunting the left lateral costophrenic angle. No significant right pleural effusion.  Surgical changes, devices, tubes and lines: Weighted Dobbhoff tube noted coursing below the left hemidiaphragm with its tip overlying the left lower abdomen, over the region of distal stomach. Right-sided CT Port-A-Cath is seen with its tip overlying the lower portion of superior vena cava.  IMPRESSION: *Weighted Dobbhoff tube noted coursing below the left hemidiaphragm with its tip overlying the left lower abdomen, over the region of distal stomach.  Electronically Signed   By: Ree Molt M.D.   On: 05/22/2024 15:35    Past Medical History:  Diagnosis Date   Anemia 04/2018   low iron. to be started on supplements   Cervical cancer (HCC)    CKD (chronic kidney disease)    Stage IV   Complication of anesthesia    receceived too much anesthesia, that she was in coma for a couple days    COVID-19 virus detected 10/28/2019   Diabetes mellitus without complication (HCC)    type II   ESRD (end stage renal disease) (HCC)    Heart murmur    followed as a child only   HSIL (high grade squamous intraepithelial lesion) on Pap smear of cervix    Hyperlipidemia associated with type 2 diabetes mellitus (HCC)    Hypertension    Pancreatitis     Peripheral vascular disease     Past Surgical History:  Procedure Laterality Date   AMPUTATION TOE Left 2013   2nd toe. tip of toe (toe nail was infected)   AV FISTULA PLACEMENT Left 05/11/2018   Procedure: ARTERIOVENOUS (AV) FISTULA CREATION;  Surgeon: Jama Cordella MATSU, MD;  Location: [facility] ORS;  Service: Vascular;  Laterality: Left;   CATARACT EXTRACTION     CERVICAL CONIZATION W/BX N/A 04/08/2020   Procedure: CONIZATION CERVIX WITH BIOPSY;  Surgeon: Mancil Barter, MD;  Location: [facility] ORS;  Service: Gynecology;  Laterality: N/A;   CHOLECYSTECTOMY  2014   COLONOSCOPY     COLONOSCOPY WITH PROPOFOL   N/A 01/10/2018   Procedure: COLONOSCOPY WITH PROPOFOL ;  Surgeon: Toledo, Ladell POUR, MD;  Location: [facility] ENDOS[facility];  Service: Gastroenterology;  Laterality: N/A;   DIALYSIS/PERMA CATHETER INSERTION N/A 05/21/2018   Procedure: DIALYSIS/PERMA CATHETER INSERTION;  Surgeon: Marea Selinda RAMAN, MD;  Location: [facility];  Service: Cardiovascular;  Laterality: N/A;   DIALYSIS/PERMA CATHETER INSERTION N/A 07/14/2020   Procedure: DIALYSIS/PERMA CATHETER INSERTION;  Surgeon: Marea Selinda RAMAN, MD;  Location: [facility];  Service: Cardiovascular;  Laterality: N/A;   DIALYSIS/PERMA CATHETER REMOVAL N/A 04/11/2019   Procedure: DIALYSIS/PERMA CATHETER REMOVAL;  Surgeon: Marea Selinda RAMAN, MD;  Location: [facility];  Service: Cardiovascular;  Laterality: N/A;   EYE SURGERY Bilateral 2018   cataract extractions   IR IMAGING GUIDED PORT INSERTION  06/05/2020   THROMBECTOMY W/ EMBOLECTOMY  05/11/2018   Procedure: THROMBECTOMY ARTERIOVENOUS FISTULA;  Surgeon: Jama Cordella MATSU, MD;  Location: [facility] ORS;  Service: Vascular;;   TUBAL LIGATION  1984    Family History  Problem Relation Age of Onset   Stroke Mother    Hypertension Mother    Gout Mother    Cancer Father    Diabetes Sister    Diabetes Maternal Grandmother    Diabetes Maternal Grandfather    Diabetes Paternal Grandmother    Diabetes  Paternal Grandfather     Prior to Admission medications   Medication Sig Start Date End Date Taking? Authorizing Provider  acetaminophen  (TYLENOL ) 325 MG tablet Take 2 tablets (650 mg total) by mouth every 4 (four) hours as needed for mild pain (pain score 1-3) or fever (or temp > 37.5 C (99.5 F)). 04/02/24   Dorinda Drue DASEN, MD  aspirin  EC 81 MG tablet Take 1 tablet (81 mg total) by mouth daily. Swallow whole. 04/02/24   Dorinda Drue DASEN, MD  atorvastatin  (LIPITOR) 40 MG tablet Take 1 tablet (40 mg total) by mouth daily. 04/03/24   Dorinda Drue DASEN, MD  b complex-vitamin c-folic acid  (NEPHRO-VITE) 0.8 MG TABS tablet Take 1 tablet by mouth at bedtime. 05/05/24   Franchot Novel, MD  bisacodyl  (DULCOLAX) 5 MG EC tablet Take 1 tablet (5 mg total) by mouth daily as needed for moderate constipation. 04/02/24   Dorinda Drue DASEN, MD  carvedilol  (COREG ) 25 MG tablet Take 1 tablet (25 mg total) by mouth 2 (two) times daily with a meal. 04/01/22   Krishnan, Sendil K, MD  cloNIDine  (CATAPRES  - DOSED IN MG/24 HR) 0.3 mg/24hr patch Place 1 patch (0.3 mg total) onto the skin once a week. 04/07/24   Dorinda Drue DASEN, MD  diltiazem  (CARDIZEM  CD) 180 MG 24 hr capsule Take 1 capsule (180 mg total) by mouth daily. 04/03/24   Dorinda Drue DASEN, MD  doxycycline (VIBRAMYCIN) 100 MG capsule Take 100 mg by mouth 2 (two) times daily. 04/23/24   [provider]  ferrous sulfate  325 (65 FE) MG tablet Take 325 mg by mouth daily. 04/23/24   [provider]  hydrALAZINE  (APRESOLINE ) 25 MG tablet Take 1 tablet (25 mg total) by mouth every 6 (six) hours as needed (SBP >160). 04/02/24   Dorinda Drue DASEN, MD  isosorbide  mononitrate (IMDUR ) 120 MG 24 hr tablet Take 120 mg by mouth daily. Patient not taking: Reported on 05/16/2024 04/28/24   [provider]  losartan  (COZAAR ) 100 MG tablet SMARTSIG:1 Tablet(s) By Mouth Every Evening 02/03/22   [provider]  pantoprazole  (PROTONIX ) 40 MG tablet Take 1 tablet (40 mg  total) by mouth daily.  04/02/24   Dorinda Drue DASEN, MD  QUEtiapine  (SEROQUEL ) 100 MG tablet Take 1 tablet (100 mg total) by mouth at bedtime. Patient not taking: Reported on 05/16/2024 04/02/24   Dorinda Drue DASEN, MD  QUEtiapine  (SEROQUEL ) 25 MG tablet Take 1 tablet (25 mg total) by mouth every 8 (eight) hours as needed (psychosis/anxiety). Patient not taking: Reported on 05/16/2024 04/02/24   Dorinda Drue DASEN, MD  QUEtiapine  (SEROQUEL ) 25 MG tablet Take 0.5 tablets (12.5 mg total) by mouth 3 (three) times daily. Patient not taking: Reported on 05/16/2024 05/05/24   Franchot Novel, MD  SENEXON-S 8.6-50 MG tablet Take 1 tablet by mouth daily. 04/23/24   [provider]  spironolactone  (ALDACTONE ) 25 MG tablet Take 1 tablet (25 mg total) by mouth daily. 04/03/24   Dorinda Drue DASEN, MD  traZODone  (DESYREL ) 50 MG tablet Take 1 tablet (50 mg total) by mouth at bedtime. 04/02/24   Dorinda Drue DASEN, MD  vancomycin  (VANCOCIN ) 125 MG capsule Take 1 capsule (125 mg total) by mouth 4 (four) times daily. 05/05/24   Franchot Novel, MD    Current Facility-Administered Medications  Medication Dose Route Frequency Provider Last Rate Last Admin   (feeding supplement) PROSource Plus liquid 30 mL  30 mL Oral BID BM Patel, Pranav M, MD   30 mL at 06/11/24 1604   acetaminophen  (TYLENOL ) tablet 650 mg  650 mg Oral Q6H PRN Patel, Pranav M, MD   650 mg at 05/31/24 1052   Or   acetaminophen  (TYLENOL ) suppository 650 mg  650 mg Rectal Q6H PRN Patel, Pranav M, MD       B-complex with vitamin C tablet 1 tablet  1 tablet Oral Daily Patel, Pranav M, MD   1 tablet at 06/11/24 1605   carvedilol  (COREG ) tablet 25 mg  25 mg Oral BID WC Kakrakandy, Arshad N, MD   25 mg at 06/09/24 1814   Chlorhexidine  Gluconate Cloth 2 % PADS 6 each  6 each Topical Q0600 Lenon Charmaine BRAVO, NP   6 each at 06/11/24 9473   cloNIDine  (CATAPRES  - Dosed in mg/24 hr) patch 0.3 mg  0.3 mg Transdermal Weekly Kakrakandy, Arshad N, MD   0.3 mg at 06/07/24 1039    Darbepoetin Alfa  (ARANESP ) injection 100 mcg  100 mcg Subcutaneous Q Mon-1800 Reome, Earle J, RPH   100 mcg at 06/10/24 1845   diltiazem  (CARDIZEM  CD) 24 hr capsule 360 mg  360 mg Oral Daily Gonfa, Taye T, MD   360 mg at 06/09/24 1005   famotidine  (PEPCID ) tablet 20 mg  20 mg Oral Daily Patel, Pranav M, MD   20 mg at 06/11/24 1604   ferrous sulfate  tablet 325 mg  325 mg Oral Daily Franky Redia SAILOR, MD   325 mg at 06/11/24 1605   Gerhardt's butt cream   Topical QID Daniels, James K, NP   Given at 06/11/24 1500   hydrALAZINE  (APRESOLINE ) injection 10 mg  10 mg Intravenous Q4H PRN Patel, Pranav M, MD   10 mg at 06/11/24 0556   insulin  aspart (novoLOG ) injection 0-5 Units  0-5 Units Subcutaneous QHS Patel, Pranav M, MD       insulin  aspart (novoLOG ) injection 0-9 Units  0-9 Units Subcutaneous TID WC Patel, Pranav M, MD   1 Units at 06/09/24 1300   isosorbide  mononitrate (IMDUR ) 24 hr tablet 30 mg  30 mg Oral Daily Patel, Pranav M, MD   30 mg at 06/11/24 1605   lactose free nutrition (BOOST PLUS) liquid  237 mL  237 mL Oral TID WC Tobie Yetta HERO, MD   237 mL at 06/10/24 1246   liver oil-zinc  oxide (DESITIN) 40 % ointment 1 Application  1 Application Topical PRN Blondie Lynwood POUR, NP   1 Application at 05/22/24 9377   losartan  (COZAAR ) tablet 50 mg  50 mg Oral Daily Norine Manuelita LABOR, MD   50 mg at 06/09/24 1006   multivitamin (RENA-VIT) tablet 1 tablet  1 tablet Oral QHS Kathrin Mignon DASEN, MD   1 tablet at 06/10/24 2149   Oral care mouth rinse  15 mL Mouth Rinse PRN Franky Redia SAILOR, MD       oxyCODONE  (Oxy IR/ROXICODONE ) immediate release tablet 2.5 mg  2.5 mg Oral Q6H PRN Patel, Pranav M, MD   2.5 mg at 06/11/24 1558   sodium chloride  flush (NS) 0.9 % injection 10-40 mL  10-40 mL Intracatheter Q12H Arthea Child, MD   10 mL at 06/10/24 2150   thiamine  (VITAMIN B1) tablet 100 mg  100 mg Oral Daily Gonfa, Taye T, MD   100 mg at 06/11/24 1605    Allergies as of 05/16/2024 - Review Complete  05/16/2024  Allergen Reaction Noted   Amlodipine  Swelling 04/24/2017   Hctz [hydrochlorothiazide]  10/19/2015   Heparin  Other (See Comments) 05/21/2018   Lmw heparin   10/30/2019   Other Other (See Comments) 09/03/2018   Sulfa antibiotics Rash 10/19/2015   Lactose  04/13/2022   Dairy aid [tilactase]  05/04/2018   Hydralazine   10/09/2018    Social History   Socioeconomic History   Marital status: Married    Spouse name: clarence   Number of children: 2   Years of education: Not on file   Highest education level: Not on file  Occupational History    Comment: works nights  Tobacco Use   Smoking status: Never   Smokeless tobacco: Never  Vaping Use   Vaping status: Never Used  Substance and Sexual Activity   Alcohol use: No   Drug use: Never   Sexual activity: Not Currently  Other Topics Concern   Not on file  Social History Narrative   Not on file   Social Drivers of Health   Financial Resource Strain: High Risk (04/24/2024)   Received from [facility]   Overall Financial Resource Strain (CARDIA)    Difficulty of Paying Living Expenses: Hard  Food Insecurity: No Food Insecurity (05/16/2024)   Hunger Vital Sign    Worried About Running Out of Food in the Last Year: Never true    Ran Out of Food in the Last Year: Never true  Recent Concern: Food Insecurity - Food Insecurity Present (04/24/2024)   Received from [facility]   Hunger Vital Sign    Within the past 12 months, you worried that your food would run out before you got the money to buy more.: Often true    Within the past 12 months, the food you bought just didn't last and you didn't have money to get more.: Sometimes true  Transportation Needs: No Transportation Needs (05/16/2024)   PRAPARE - Administrator, Civil Service (Medical): No    Lack of Transportation (Non-Medical): No  Recent Concern: Transportation Needs - Unmet Transportation Needs (04/24/2024)   Received  from [facility] - Transportation    In the past 12 months, has lack of transportation kept you from medical appointments or from getting medications?: Yes  Lack of Transportation (Non-Medical): No  Physical Activity: Not on file  Stress: Not on file  Social Connections: Unknown (05/16/2024)   Social Connection and Isolation Panel    Frequency of Communication with Friends and Family: Never    Frequency of Social Gatherings with Friends and Family: Once a week    Attends Religious Services: Never    Database administrator or Organizations: No    Attends Banker Meetings: Never    Marital Status: Patient declined  Catering manager Violence: Not At Risk (05/16/2024)   Humiliation, Afraid, Rape, and Kick questionnaire    Fear of Current or Ex-Partner: No    Emotionally Abused: No    Physically Abused: No    Sexually Abused: No     Code Status   Code Status: Full Code  Review of Systems: Unable to obtain secondary to dementia .  Physical Exam: Vital signs in last 24 hours: Temp:  [97.8 F (36.6 C)-99.1 F (37.3 C)] 97.8 F (36.6 C) (09/30 0840) Pulse Rate:  [62-99] 93 (09/30 1221) Resp:  [12-19] 12 (09/30 1221) BP: (104-196)/(49-76) 163/54 (09/30 1221) SpO2:  [96 %-100 %] 98 % (09/30 1221) Last BM Date : 06/10/24  General:  Frail female in NAD Psych:  Cooperative.  Eyes: Pupils equal Ears:  Normal auditory acuity Nose: No deformity, discharge or lesions Neck:  Supple, no masses felt Lungs:  Clear to auscultation.  Heart:  Regular rate Abdomen:  Soft, nondistended, nontender, active bowel sounds, no masses felt Rectal :  Deferred Msk: Symmetrical without gross deformities.  Extremities : No edema Skin:  Intact without significant lesions.    Intake/Output from previous day: 09/29 0701 - 09/30 0700 In: 100 [P.O.:100] Out: -  Intake/Output this shift:  Total I/O In: -  Out: 1500 [Other:1500]   Vina Dasen, NP-C    06/11/2024, 4:41 PM   Attending physician's note   I have taken a history, reviewed the chart, and examined the patient. I performed a substantive portion of this encounter, including complete performance of at least one of the key components, in conjunction with the APP. I agree with the APP's note, impression, and recommendations with my edits.    68 year old female with complex medical history as outlined above, to include history of ESRD on HD, HTN, diabetes, dementia, history of cervical cancer, recent C. difficile infection.  GI service consulted for evaluation of hematochezia.  Patient is a limited historian, but does relay she is passing blood from rectum and vagina for the last couple days.    CT on 05/16/24 with circumferential wall thickening of the rectosigmoid with immediately adjacent area of colonic distention with stool also with wall thickening and moderate to large formed stool throughout the remainder of the colon, along with uterine colonic fistula with large posterior uterine soft tissue defect containing stool and air.  She was transferred from outside facility to [facility] and surgical service consulted.  Reviewed prior CT from 04/28/2024 which did not demonstrate any Colo uterine fistula, and did show significant fecal retention.  Last colonoscopy was 01/2018 and aside from mild sigmoid diverticulosis, largely unremarkable.  1) Hematochezia 2) Colo uterine fistula Etiology for potential Colo uterine fistula not entirely clear.  DDx includes prior radiation history (although would expect to have seen fistula on prior imaging in August), IBD (no known history of Crohn's), instrumentation, diverticular disease (no reported history of recurrent diverticulitis), or malignancy (does have a prior history of cervical cancer with prior radiation).  Would  not expect this to be sequelae of recent C. difficile.  While colonoscopy could be helpful as a diagnostic tool, therapeutic options could be  limited if she is truly having any significant bleeding related to the [facility] uterine fistula.  Based on her significant comorbidities, would need to approach any endoscopic procedures cautiously and in conjunction with family conversation.  Otherwise not currently having any pain. - Stat CTA this evening to evaluate source of bleeding - Serial CBC checks with blood products as needed per protocol - In light of active bleeding, I [facility] service to obtain surgical re-consult  3) History of C. difficile colitis -Completed course of vancomycin  then changed to Dificid  x 10 days and back to vancomycin  prophylaxis due to [facility] uterine fistula with last dose on 9/24.  ID service consulted and managing  4) ESRD - HD per Nephrology service  5) Dementia 6) HTN - Management per [facility], DO, FACG [phone] office

## 2024-06-11 NOTE — Plan of Care (Signed)

## 2024-06-11 NOTE — Progress Notes (Signed)
 Patient started to pass blood and blood clots from vagina and Eliquis .  Notified MD, verbal order to hold eliquis .  Sabas Brod MD came to assess patient at the bedside.

## 2024-06-11 NOTE — Progress Notes (Signed)
 OT Cancellation Note  Patient Details Name: Tracy Jennings MRN: [id number] DOB: 02-Dec-1955   Cancelled Treatment:    Reason Eval/Treat Not Completed: Other (comment) Per Pt family member at bedside, Pt is experiencing vaginal bleeding and unable to participate in therapy this date. Informed family that OT will continue per POC and follow-up with Pt as medically able.   Maurilio CROME, OTR/LSABRA  [facility]  Office: [phone]   Maurilio JINNY Peek 06/11/2024, 3:43 PM

## 2024-06-11 NOTE — Progress Notes (Signed)
 [facility]  Date of Admission:  05/16/2024      Total days of antibiotics 0          ASSESSMENT: Tracy Jennings is a 68 y.o. female   H/O Recent CDiff Infection -  Recurrent Diarrhea -  Tx 8/17 - 8/24 with PO vancomycin  with CDiff Ag (+), TOxin (-) and PCR (+). Treatment was changed to Dificid  during inpatient stay and completed 10-d course. During treatment for colouterine fistula had recurrence she was maintained on oral vancomycin  prophylaxis 125 mg every day with last dose on 9/24.  We were notified yesterday that she had recurrent diarrhea starting back on 9/27. Was treated with a dose of imodium.  She is not able to really clarify much regarding quantity, symptoms or volume. Described in flowsheets to be 4-6 small liquid stools (brown/green). She has had trouble with maintaining adequate nutrition and has required supplementation. She has no abdominal discomfort on exam. No fevers, No dehydration, Resolving leukocytosis. Not uncommon to have diarrhea following treatment. Clinically low concern for relapse.  Would continue with conservative PRN imodium for now and encourage tolerable diet.  If she worsens with regards to frequency or volume of stools or has worsened leukocytosis or develops more systemic symptoms would repeat testing and call back.   H/O Colouterine Fistula -  Surgery suggested to manage conservatively (treated).   Discharge Plan -  Seems plan is going to be SNF for her.   Enteric Precautions -  Noted to continue throughout hospitalization per IP procedure.    PLAN: OK to do conservative Imodium for now  If she worsens with regards to frequency or volume of stools or has worsened leukocytosis or develops more systemic symptoms would repeat testing and call us  back.    Principal Problem:   Colouterine fistula Active Problems:   Chronic recurrent pancreatitis (HCC)   ESRD (end stage renal disease) (HCC)   Type 2  diabetes mellitus with other specified complication (HCC)   Essential hypertension   Cervical cancer (HCC)   Dementia with behavioral disturbance (HCC)   C. difficile colitis   PVD (peripheral vascular disease)   Fistula    (feeding supplement) PROSource Plus  30 mL Oral BID BM   apixaban   5 mg Oral BID   B-complex with vitamin C  1 tablet Oral Daily   carvedilol   25 mg Oral BID WC   Chlorhexidine  Gluconate Cloth  6 each Topical Q0600   cloNIDine   0.3 mg Transdermal Weekly   darbepoetin (ARANESP ) injection - DIALYSIS  100 mcg Subcutaneous Q Mon-1800   diltiazem   360 mg Oral Daily   famotidine   20 mg Oral Daily   ferrous sulfate   325 mg Oral Daily   Gerhardt's butt cream   Topical QID   insulin  aspart  0-5 Units Subcutaneous QHS   insulin  aspart  0-9 Units Subcutaneous TID WC   isosorbide  mononitrate  30 mg Oral Daily   lactose free nutrition  237 mL Oral TID WC   losartan   50 mg Oral Daily   multivitamin  1 tablet Oral QHS   sodium chloride  flush  10-40 mL Intracatheter Q12H   thiamine   100 mg Oral Daily    SUBJECTIVE: I am hungry   Review of Systems: ROS  Allergies  Allergen Reactions   Amlodipine  Swelling    Knees down to ankles Knees down to ankles   Hctz [Hydrochlorothiazide]     pancreatitis  Heparin  Other (See Comments)    Hx  HIT,   Pt reports cardiac arrest when given heparin    Lmw Heparin      Reports cardiac arrest when given heparin    Other Other (See Comments)    Reports cardiac arrest when given during surgery Reports cardiac arrest when given during surgery Reports cardiac arrest when given during surgery    Sulfa Antibiotics Rash   Lactose Diarrhea    Per family report   Dairy Aid [Tilactase]     Runny nose   Hydralazine      Nausea and rash    OBJECTIVE: Vitals:   06/11/24 1120 06/11/24 1135 06/11/24 1206 06/11/24 1221  BP: 131/61 (!) 126/49 116/76 (!) 163/54  Pulse: 95 96 93 93  Resp: 17 17 12 12   Temp:      TempSrc:      SpO2:  100% 100% 100% 98%  Weight:      Height:       Body mass index is 20.58 kg/m.  Physical Exam Constitutional:      Appearance: She is not ill-appearing.  Cardiovascular:     Rate and Rhythm: Normal rate.  Pulmonary:     Effort: Pulmonary effort is normal.     Breath sounds: Normal breath sounds.  Abdominal:     General: There is no distension.     Palpations: Abdomen is soft.     Tenderness: There is no abdominal tenderness.  Skin:    General: Skin is warm and dry.     Capillary Refill: Capillary refill takes less than 2 seconds.  Neurological:     Mental Status: She is alert. She is disoriented.     Lab Results Lab Results  Component Value Date   WBC 14.3 (H) 06/11/2024   HGB 7.1 (L) 06/11/2024   HCT 23.5 (L) 06/11/2024   MCV 96.7 06/11/2024   PLT 249 06/11/2024    Lab Results  Component Value Date   CREATININE 5.43 (H) 06/11/2024   BUN 46 (H) 06/11/2024   NA 139 06/11/2024   K 4.7 06/11/2024   CL 100 06/11/2024   CO2 27 06/11/2024    Lab Results  Component Value Date   ALT 10 06/03/2024   AST 13 (L) 06/03/2024   ALKPHOS 59 06/03/2024   BILITOT 0.7 06/03/2024     Microbiology: No results found for this or any previous visit (from the past 240 hours).  Corean Fireman, MSN, NP-C [facility] Infectious [facility] Pager: [phone]  @TODAY @ 2:21 PM   Total Encounter Time: 15 m

## 2024-06-11 NOTE — Progress Notes (Signed)
 [facility] Progress Note   Subjective:   Seen on HD (rolled over from Monday). BP very high to start but coming down - 1.5L UFG and tolerating. Denies CP/dyspnea. She is hungry.  Objective Vitals:   06/11/24 0935 06/11/24 0950 06/11/24 1005 06/11/24 1020  BP: (!) 155/72 (!) 178/64 135/70 (!) 156/76  Pulse: 96 97 96 97  Resp:   17 17  Temp:      TempSrc:      SpO2: 99% 100% 100% 100%  Weight:      Height:       Physical Exam General: Frail woman, NAD. Room air. + eyelid edema Heart: RRR Lungs: CTAB Extremities: No LE edema Dialysis Access: L AVG  Additional Objective Labs: Basic Metabolic Panel: Recent Labs  Lab 06/07/24 0503 06/11/24 0028  NA 136 139  K 4.3 4.7  CL 95* 100  CO2 28 27  GLUCOSE 107* 98  BUN 30* 46*  CREATININE 4.10* 5.43*  CALCIUM  7.9* 8.2*  PHOS 4.5 3.9   Liver Function Tests: Recent Labs  Lab 06/07/24 0503 06/11/24 0028  ALBUMIN 1.6* 1.7*   CBC: Recent Labs  Lab 06/07/24 0503 06/11/24 0028  WBC 14.2* 14.3*  NEUTROABS  --  11.9*  HGB 8.1* 7.1*  HCT 26.8* 23.5*  MCV 93.7 96.7  PLT 238 249   Medications:   (feeding supplement) PROSource Plus  30 mL Oral BID BM   apixaban   5 mg Oral BID   B-complex with vitamin C  1 tablet Oral Daily   carvedilol   25 mg Oral BID WC   Chlorhexidine  Gluconate Cloth  6 each Topical Q0600   cloNIDine   0.3 mg Transdermal Weekly   darbepoetin (ARANESP ) injection - DIALYSIS  100 mcg Subcutaneous Q Mon-1800   diltiazem   360 mg Oral Daily   famotidine   20 mg Oral Daily   ferrous sulfate   325 mg Oral Daily   Gerhardt's butt cream   Topical QID   insulin  aspart  0-5 Units Subcutaneous QHS   insulin  aspart  0-9 Units Subcutaneous TID WC   isosorbide  mononitrate  30 mg Oral Daily   lactose free nutrition  237 mL Oral TID WC   losartan   50 mg Oral Daily   multivitamin  1 tablet Oral QHS   sodium chloride  flush  10-40 mL Intracatheter Q12H   thiamine   100 mg Oral Daily    Dialysis  Orders MWF [facility]  3.5hrs, 400/500, EDW 53kg, 2K/2.5Ca bath, AVG, heparin  6000 unit bolus + 400u/hr pump - Calcitriol  1.21mcg with HD - Sensipar 60mg  with HD - Micera 75mcg Q 4 weeks - per records, dose just ordered (not given) - Binder: Calcium  Acetate 667mh TID with meals   Assessment/Plan: Colo-uterine fistula: per abd CT. Appears surgery has signed off and plans outpt elective management;  C-diff colitis: s/p course of po vancomycin .  LUE edema: LUE duplex ordered by Hospitalist - no issue with LUE but acute non occlusive RIJ clot noted - started on eliquis .  No issues with AVG thus far. Monitor. ESRD: on HD MWF. Unable to be dialyzed yesterday d/t high HD census -> for HD now.  HTN/volume: HTN on 5 BP agents, BP lower here - have stopped spironolactone . Continue volume lowering with HD. BP improving.   Anemia of ESRD: Hgb 7.1 - s/p 1 U pRBCs 9/20. Continue Aranesp  q Monday this admit. Consider another transfusion. 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 Dementia  Malnutrition: NG tube placed, TF started 9/10 - stopped 9/14 (exacerbated diarrhea), NG tube now out. Eating better-ish. GOC: Palliative consulted -  ongoing loose stools, hospice considered but now eating a little better, so family has elected to stay the course, full scope for now. Technically tolerates HD, but doubt it is contributing to QOL. SNF recommended, insurance denied, family is appealing this.   Izetta Boehringer, PA-C 06/11/2024, 10:29 AM  [employer]

## 2024-06-11 NOTE — Procedures (Signed)
 Received patient in bed to unit.  Alert and oriented to self. Informed consent signed and in chart.   TX duration: 3.25hrs  Patient tolerated well.  Transported back to the room  Alert, without acute distress.  Hand-off given to patient's nurse.   Access used: L arm [place] Access issues: None  Total UF removed: 1.5L Medication(s) given:  Post HD weight:  Post HD VS:    Tracy Jennings S [facility]

## 2024-06-11 NOTE — Progress Notes (Signed)
 OT Cancellation Note  Patient Details Name: Tracy Jennings MRN: [id number] DOB: 24-Jun-1956   Cancelled Treatment:    Reason Eval/Treat Not Completed: Patient at procedure or test/ unavailable Pt currently with HD. OT to re-check and continue POC as Pt is available and schedule allows.   Maurilio CROME, OTR/LSABRA  [facility]  Office: [phone]   Maurilio PARAS Floria Brandau 06/11/2024, 10:34 AM

## 2024-06-11 NOTE — Progress Notes (Signed)
 PROGRESS NOTE  Tracy Jennings FMW:[id number] DOB: [birth year] DOA: 05/16/2024 PCP: Zachary Idelia LABOR, MD   Brief HPI:   68 year old F with PMH of cognitive impairment, ESRD on HD MWF, cervical cancer, HTN, HLD, anemia and recent hospitalizations from 6/24-7/22 for hypertensive urgency and delirium, and 8/17-8/24 for C. difficile colitis for which she was treated with p.o. vancomycin  brought back to [facility] from PCP office due to weakness, nausea, diarrhea and abdominal discomfort.  Was brought to [facility] for concern for Colo uterine fistula. Now medically stable.    Assessment/Plan:    Bleeding from Colo uterine fistula - Consulted GI, will check CBC - Will discontinue Eliquis  - [facility] recommends to consult general surgery - Discussed with general surgeon Dr. Cordella Char on-call - He recommends checking stat CBC and if hemoglobin drops getting CTA and getting IR for embolization of bleeding vessel   C. difficile colitis ID was consulted. Was initially on oral vancomycin . Later on was on Dificid  completed for 10 days. Now back on oral vancomycin  for 7 more days.  EOT 9/24. Diarrhea appears to be improving starting 9/18. Developed loose stools yesterday, received 1 dose of Imodium.  Some improvement - Was given a dose of Imodium 2 mg x 1 yesterday -Reconsult ID for further recommendations for recurrent C. difficile colitis -ID recommends to continue with Imodium as needed   Colouterine fistula reason for transfer from [facility] to [facility]. Had significant abdominal pain.  Currently pain resolved. General surgery was consulted and recommend conservative management of treatment of C. difficile as well as outpatient follow-up. No indication for inpatient surgery per surgery. -Patient to follow-up with general surgery as outpatient for elective surgery  Persistent leukocytosis/bandemia:  Improving.   Loculated left pleural effusion/masslike consolidation:  Seems chronic.  Has  no respiratory symptoms.  IR consulted for thoracocentesis but pleural effusion felt to be small to tap safely.  Treated with Augmentin . Repeat chest x-ray in 4 to 6 weeks.   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 Currently on sliding scale insulin .   Dementia without behavioral disturbance:  Acute delirium. Appears to be confused. Minimizing pain medication. Oriented to self and family.   Failure to thrive/poor appetite/unintentional weight loss: Significant weight loss. Hopefully oral intake will improve. Will monitor.   Uncontrolled hypertension:  Improved  Continue home clonidine  patch, losartan , Coreg , hydralazine  and Aldactone  Cardizem  increased from 180 mg daily to 360 mg daily on 9/5 Ultrafiltration per nephrology. Added low-dose Imdur . Unable to take Norvasc .     Hyperkalemia Monitor.   Acute right IJ DVT:  Incidental finding.  Venous Doppler was ordered for left forearm swelling but showed acute right IJ DVT. Started on Eliquis , currently on hold due to bleeding as above   ESRD on HD MWF Nephrology following. Appreciate assistance. Continue hemodialysis as scheduled.   Anemia of renal disease:  Hemoglobin stable at around 7 right now. Folic acid  was low but within normal range. B12 also within low normal range. Will initiate supplementation. Received PRBC on 9/12.  Repeat PRBC ordered on 9/28  Follow-up H&H adequate.    Physical deconditioning Therapy recommended SNF.  Seems family is in agreement now   History of uterine cancer: Outpatient follow-up.   Severe malnutrition/failure to thrive/hypoalbuminemia Continue supplements.   Goals of care Palliative care consulted for goals of care.  For now holding off on further conversation.  Monitor for improvement.   Right facial swelling. --In  setting of anasarca due to hypoalbuminemia -Swelling shifts once patient changes her position  from left to right Family reports that the right side of her face is swollen.  Pupils are equal and reactive to light.  No tenderness on extraocular muscle movement.  No tenderness on examination.  No warmth or redness seen as well. At present this is positional only.  - Discussed with nephrology, due to anasarca.  Disposition - Insurance company has refused skilled nursing facility for rehab - Family has appealed against insurance companies decision    Medications     (feeding supplement) PROSource Plus  30 mL Oral BID BM   B-complex with vitamin C  1 tablet Oral Daily   carvedilol   25 mg Oral BID WC   Chlorhexidine  Gluconate Cloth  6 each Topical Q0600   cloNIDine   0.3 mg Transdermal Weekly   darbepoetin (ARANESP ) injection - DIALYSIS  100 mcg Subcutaneous Q Mon-1800   diltiazem   360 mg Oral Daily   famotidine   20 mg Oral Daily   ferrous sulfate   325 mg Oral Daily   Gerhardt's butt cream   Topical QID   insulin  aspart  0-5 Units Subcutaneous QHS   insulin  aspart  0-9 Units Subcutaneous TID WC   isosorbide  mononitrate  30 mg Oral Daily   lactose free nutrition  237 mL Oral TID WC   losartan   50 mg Oral Daily   multivitamin  1 tablet Oral QHS   sodium chloride  flush  10-40 mL Intracatheter Q12H   thiamine   100 mg Oral Daily     Data Reviewed:   CBG:  Recent Labs  Lab 06/10/24 1217 06/10/24 1718 06/10/24 2107 06/11/24 0816 06/11/24 1243  GLUCAP 85 112* 94 78 88    SpO2: 98 %    Vitals:   06/11/24 1120 06/11/24 1135 06/11/24 1206 06/11/24 1221  BP: 131/61 (!) 126/49 116/76 (!) 163/54  Pulse: 95 96 93 93  Resp: 17 17 12 12   Temp:      TempSrc:      SpO2: 100% 100% 100% 98%  Weight:      Height:          Data Reviewed:  Basic Metabolic Panel: Recent Labs  Lab 06/07/24 0503 06/11/24 0028  NA 136 139  K 4.3 4.7  CL 95* 100  CO2 28 27  GLUCOSE 107* 98  BUN 30* 46*  CREATININE 4.10* 5.43*  CALCIUM  7.9* 8.2*  PHOS 4.5 3.9    CBC: Recent Labs   Lab 06/07/24 0503 06/11/24 0028  WBC 14.2* 14.3*  NEUTROABS  --  11.9*  HGB 8.1* 7.1*  HCT 26.8* 23.5*  MCV 93.7 96.7  PLT 238 249    LFT Recent Labs  Lab 06/07/24 0503 06/11/24 0028  ALBUMIN 1.6* 1.7*     Antibiotics: Anti-infectives (From admission, onward)    Start     Dose/Rate Route Frequency Ordered Stop   06/04/24 1215  vancomycin  (VANCOCIN ) capsule 125 mg        125 mg Oral Daily 06/04/24 1116 06/05/24 0621   05/27/24 1000  vancomycin  (VANCOCIN ) capsule 125 mg        125 mg Oral Daily 05/21/24 1019 06/04/24 0959   05/20/24 1530  amoxicillin -clavulanate (AUGMENTIN ) 500-125 MG per tablet 1 tablet        1 tablet Oral Every 12 hours 05/20/24 1519 05/29/24 2152   05/17/24 0100  fidaxomicin  (DIFICID ) tablet 200 mg        200 mg Oral  2 times daily 05/17/24 0003 05/26/24 1037   05/17/24 0100  metroNIDAZOLE  (FLAGYL ) IVPB 500 mg  Status:  Discontinued        500 mg 100 mL/hr over 60 Minutes Intravenous Every 12 hours 05/17/24 0003 05/20/24 1519        DVT prophylaxis: Apixaban   Code Status: Full code  Family Communication: No family at bedside   CONSULTS nephrology   Subjective   Called by RN that patient started having blood clots passing through the vagina and rectum.  Denies abdominal pain   Objective    Physical Examination:  Appears in no acute distress Abdomen is soft, nontender Lungs clear to auscultation bilaterally   Status is: Inpatient:             Sabas GORMAN Brod   [facility] If 7PM-7AM, please contact night-coverage at [email or web address], Office  [phone]   06/11/2024, 6:42 PM  LOS: 26 days

## 2024-06-12 DIAGNOSIS — N824 Other female intestinal-genital tract fistulae: Secondary | ICD-10-CM | POA: Diagnosis not present

## 2024-06-12 DIAGNOSIS — N186 End stage renal disease: Secondary | ICD-10-CM | POA: Diagnosis not present

## 2024-06-12 DIAGNOSIS — K921 Melena: Secondary | ICD-10-CM | POA: Diagnosis not present

## 2024-06-12 DIAGNOSIS — F03918 Unspecified dementia, unspecified severity, with other behavioral disturbance: Secondary | ICD-10-CM | POA: Diagnosis not present

## 2024-06-12 LAB — GLUCOSE, CAPILLARY
Glucose-Capillary: 109 mg/dL — ABNORMAL HIGH (ref 70–99)
Glucose-Capillary: 110 mg/dL — ABNORMAL HIGH (ref 70–99)
Glucose-Capillary: 113 mg/dL — ABNORMAL HIGH (ref 70–99)
Glucose-Capillary: 91 mg/dL (ref 70–99)

## 2024-06-12 LAB — HEMOGLOBIN AND HEMATOCRIT, BLOOD
HCT: 30.3 % — ABNORMAL LOW (ref 36.0–46.0)
Hemoglobin: 9.8 g/dL — ABNORMAL LOW (ref 12.0–15.0)

## 2024-06-12 MED ORDER — POLYETHYLENE GLYCOL 3350 17 GM/SCOOP PO POWD
119.0000 g | Freq: Once | ORAL | Status: AC
  Administered 2024-06-12: 119 g via ORAL
  Filled 2024-06-12: qty 119

## 2024-06-12 NOTE — Plan of Care (Signed)
   Problem: Elimination: Goal: Will not experience complications related to bowel motility Outcome: Progressing   Problem: Safety: Goal: Ability to remain free from injury will improve Outcome: Progressing

## 2024-06-12 NOTE — Progress Notes (Signed)
 Nutrition Follow-up  DOCUMENTATION CODES:   Severe malnutrition in context of chronic illness  INTERVENTION:  Encourage PO intake - Currently on Dysphagia 2, thin liquids per family request   Nursing to assist with meals  House trays  Boost Plus TID , Each supplement provides 360 kcal and 14 gm protein Prosource Plus BID, each supplement provides 100 kcal and 15 gm protein  Add extra gravies and soup to trays per family request Rena-Vite daily  100 mg Thiamine  daily  May need TPN if PO intake decreases   *Per family, pt is lactose intolerant   NUTRITION DIAGNOSIS:   Severe Malnutrition related to chronic illness as evidenced by severe muscle depletion, severe fat depletion, meal completion < 25%, per patient/family report, energy intake < or equal to 75% for > or equal to 1 month. - Ongoing   GOAL:   Patient will meet greater than or equal to 90% of their needs - Progressing   MONITOR:   PO intake, Supplement acceptance, Labs, I & O's  REASON FOR ASSESSMENT:   Consult Assessment of nutrition requirement/status  ASSESSMENT:   Pt with PMH of dementia, ESRD on HD MWF, cervical ca, HTN, HLD, anemia, recent hospitalizations from 6/24 - 7/22 for hypertensive urgency and delirium, and 8/17 - 8/24 for Cdiff colitis now admitted from home with abd discomfort, weakness, Nausea, and diarrhea.  9/4 - CT of abd and pelvis showed rectosigmoid colon suspicious for colitis, colo-uterine fistula, increased bilateral pleural effusions, and progressive anasarca. Surgery, Dasie, consulted and recommends continued treatment for cdiff and outpt follow up and ok for diet advancement.  9/8 - Left pleural effusion too small for Thoracocentesis, concern for malignancy. HD today  9/10 - Cortrak placed (needs to be confirmed by x-ray before use), TF to be started, HD today  9/14 - tube feeds held 9/15 - Tube feeds discontinued as pt with increased stools per MD, cortrak only for meds 9/17 - NGT  removed   Pt a little more awake today compared to last visit, more interactive. Per family at bedside, pt's intake has been improving. Now eats on average 50% of her meals. Had 100% of her scrambled eggs, and 75% of her sausage this morning. Family wants pt to continue on dysphagia 2 diet as she has trouble chewing. Refuses Boost Plus, maybe drinks 1 per day. Has been taking Prosource Plus po BID. Family wanted to add extra gravies and soup to tray to supplement intake. Family has been ordering meals for pt.   Per surgery, new onset rectal and vaginal bleeding. No indication for urgent surgical intervention. If intake declines again may need TPN. Continues to have loose BM, x6 loose BM documented 9/29. 2 BM already today.   Ongoing GOC, family considered hospice but now that pt is eating better will remain full scope. Tolerating HD.   Disposition: SNF pending bed offer and insurance authorization   Admit weight: 57 kg Current weight: 52.7 kg EDW: 53 kg  Average Meal Intake: 9/5-9/9: 6% intake x 4 recorded meals 9/10-9/12: 0% intake x 2 recorded meals 9/17-9/23: 30% intake x 6 recorded meals  9/25-10/1: 37% intake x 7 recorded meals   Nutritionally Relevant Medications: Scheduled Meds:  (feeding supplement) PROSource Plus  30 mL Oral BID BM   B-complex with vitamin C  1 tablet Oral Daily   famotidine   20 mg Oral Daily   ferrous sulfate   325 mg Oral Daily   insulin  aspart  0-5 Units Subcutaneous QHS   insulin   aspart  0-9 Units Subcutaneous TID WC   lactose free nutrition  237 mL Oral TID WC   losartan   50 mg Oral Daily   multivitamin  1 tablet Oral QHS   thiamine   100 mg Oral Daily   Labs Reviewed: No updated labs  CBG ranges from 78-140 mg/dL over the last 24 hours HgbA1c 5.4  NUTRITION - FOCUSED PHYSICAL EXAM:  Flowsheet Row Most Recent Value  Orbital Region Severe depletion  Upper Arm Region Severe depletion  Thoracic and Lumbar Region Severe depletion  Buccal Region  Severe depletion  Temple Region Severe depletion  Clavicle Bone Region Severe depletion  Clavicle and Acromion Bone Region Severe depletion  Scapular Bone Region Severe depletion  Dorsal Hand Severe depletion  Patellar Region Severe depletion  Anterior Thigh Region Severe depletion  Posterior Calf Region Severe depletion  Edema (RD Assessment) None  Hair Reviewed  Eyes Unable to assess  [Eyes closed]  Mouth Unable to assess  Skin Reviewed  Nails Reviewed    Diet Order:   Diet Order             DIET DYS 2 Room service appropriate? Yes; Fluid consistency: Thin  Diet effective now                   EDUCATION NEEDS:   Not appropriate for education at this time  Skin:  Skin Assessment: Reviewed RN Assessment  Last BM:  10/1 x 2, type 7, 9/30 x 6 type 7  Height:   Ht Readings from Last 1 Encounters:  05/16/24 5' 3 (1.6 m)    Weight:   Wt Readings from Last 1 Encounters:  06/09/24 52.7 kg    BMI:  Body mass index is 20.58 kg/m.  Estimated Nutritional Needs:   Kcal:  1600-1900 kcal  Protein:  80-100 gm  Fluid:  1 L + UOP   Olivia Kenning, RD Registered Dietitian  See Amion for more information

## 2024-06-12 NOTE — H&P (View-Only) (Signed)
 [facility] ROUNDING NOTE   Subjective: CT angiogram completed yesterday without any acute bleeding.  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  No bleeding today but H/H slightly downtrending at 6.8/22; transfused 2 units.   Objective: Vital signs in last 24 hours: Temp:  [97.2 F (36.2 C)-100 F (37.8 C)] 97.2 F (36.2 C) (10/01 1739) Pulse Rate:  [76-108] 76 (10/01 1739) Resp:  [15-20] 17 (10/01 1739) BP: (120-198)/(53-98) 190/65 (10/01 1739) SpO2:  [97 %-100 %] 100 % (10/01 1739) Last BM Date : 06/11/24 General: NAD Lungs:  CTA b/l, no w/r/r Heart:  RRR, no m/r/g Abdomen:  Soft, NT, ND, +BS.   Rectal: Brown stool Ext:  No c/c/e    Intake/Output from previous day: 09/30 0701 - 10/01 0700 In: 323.8 [I.V.:23.8; Blood:300] Out: 1500  Intake/Output this shift: No intake/output data recorded.   Lab Results: Recent Labs    06/11/24 0028 06/11/24 2150 06/12/24 1544  WBC 14.3* 16.9*  --   HGB 7.1* 6.8* 9.8*  PLT 249 234  --   MCV 96.7 96.1  --    BMET Recent Labs    06/11/24 0028  NA 139  K 4.7  CL 100  CO2 27  GLUCOSE 98  BUN 46*  CREATININE 5.43*  CALCIUM  8.2*   LFT Recent Labs    06/11/24 0028  ALBUMIN 1.7*   PT/INR No results for input(s): INR in the last 72 hours.    Imaging/Other results: CT ANGIO GI BLEED Result Date: 06/11/2024 CLINICAL DATA:  Lower GI bleed, cervical cancer * Tracking Code: BO * EXAM: CTA ABDOMEN AND PELVIS WITHOUT AND WITH CONTRAST TECHNIQUE: Multidetector CT imaging of the abdomen and pelvis was performed using the standard protocol during bolus administration of intravenous contrast. Multiplanar reconstructed images and MIPs were obtained and reviewed to evaluate the vascular anatomy. RADIATION DOSE REDUCTION: This exam was performed according to the departmental  dose-optimization program which includes automated exposure control, adjustment of the mA and/or kV according to patient size and/or use of iterative reconstruction technique. CONTRAST:  75mL OMNIPAQUE  IOHEXOL  350 MG/ML SOLN COMPARISON:  05/16/2024 FINDINGS: VASCULAR Normal contour and caliber of the abdominal aorta. Moderate aortic atherosclerosis and vascular calcinosis. No evidence of aneurysm, dissection, or other acute aortic pathology. Standard branching pattern of the abdominal aorta with solitary bilateral renal arteries. Review of the MIP images confirms the above findings. NON-VASCULAR Lower Chest: Unchanged moderate left pleural effusion associated atelectasis or consolidation. Coronary artery calcifications. Aortic valve calcifications. Hepatobiliary: No focal liver abnormality is seen. Status post cholecystectomy. No biliary dilatation. Pancreas: Unremarkable. No pancreatic ductal dilatation or surrounding inflammatory changes. Spleen: Normal in size without significant abnormality. Adrenals/Urinary Tract: Benign adenomatous thickening of the adrenal glands. Atrophic kidneys. Renal vascular calcifications without calculi. No hydronephrosis. Bladder is unremarkable. Stomach/Bowel: Stomach is within normal limits. Appendix appears normal. 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 Other: No abdominal wall hernia.  Anasarca.  No ascites. Musculoskeletal: No acute osseous findings. IMPRESSION: 1. No evidence of intraluminal contrast extravasation. 2. 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 3. Unchanged  enlarged inferior mesenteric lymph nodes, concerning for nodal  metastatic disease. 4. Unchanged moderate left pleural effusion and associated atelectasis or consolidation. 5. Coronary artery disease. 6. Aortic valve calcifications. Correlate for echocardiographic evidence of aortic valve dysfunction. Aortic Atherosclerosis (ICD10-I70.0). Electronically Signed   By: Marolyn JONETTA Jaksch M.D.   On: 06/11/2024 21:14      Assessment and Plan:  1) Colo uterine fistula 2) Hematochezia 3) Vaginal bleeding Etiology for potential Colo uterine fistula not entirely clear. DDx includes prior radiation history (although would expect to have seen fistula on prior imaging in August), IBD (no known history of Crohn's), instrumentation, diverticular disease (no reported history of recurrent diverticulitis), or malignancy (does have a prior history of cervical cancer with prior radiation). Would not expect this to be sequelae of recent C. difficile.  CTA on 9/30 without acute extravasation, shows persistent Colo uterine fistula, but there is also an enlarged inferior mesenteric lymph node concerning for metastatic disease.  While elevated risk given anatomy, need to establish diagnosis for etiology of this fistula, particularly with cancer being in DDx.  - Plan for flexible sigmoidoscopy tomorrow - Per patient request, plan discussed with her sister by phone who agrees - Evaluated by Surgical service.  No acute surgical needs right now - Continue serial CBC checks - Holding Eliquis   4) Dementia - As above, plan discussed with patient's sister and consent obtained over the phone  I spent 50 minutes of time, including in depth chart review, independent review of results as outlined above, communicating results with the patient directly, face-to-face time with the patient, coordinating care, ordering studies and medications as appropriate, and documentation.    Sandor LULLA Flatter, DO  06/12/2024, 7:13 PM [facility] Pager [phone]

## 2024-06-12 NOTE — Progress Notes (Addendum)
 Subjective: CC: Patient reports abdominal pain around her navel. She is taking in some po. No n/v. She believes she had BRBPR. She reports vaginal d/c but is unsure if this is stool or blood. Reports she is scared and is tearful.   Afebrile. HR 108. BP 174/97. Hgb 6.8 from 7.1. 2U PRBC pending.   Objective: Vital signs in last 24 hours: Temp:  [97.8 F (36.6 C)-100 F (37.8 C)] 98.5 F (36.9 C) (10/01 0831) Pulse Rate:  [91-108] 108 (10/01 0831) Resp:  [12-20] 17 (10/01 0831) BP: (104-198)/(49-98) 174/97 (10/01 0831) SpO2:  [96 %-100 %] 98 % (10/01 0831) Last BM Date : 06/11/24  Intake/Output from previous day: 09/30 0701 - 10/01 0700 In: 323.8 [I.V.:23.8; Blood:300] Out: 1500  Intake/Output this shift: No intake/output data recorded.  PE: Gen:  Alert, tearful Abd: Soft, ND, NT.  Unable to get chaperone at time of exam for rectal or vaginal exams -- deferred. Discussed with RN after who reported she did have vaginal bleeding and BRBPR this morning.   Lab Results:  Recent Labs    06/11/24 0028 06/11/24 2150  WBC 14.3* 16.9*  HGB 7.1* 6.8*  HCT 23.5* 22.2*  PLT 249 234   BMET Recent Labs    06/11/24 0028  NA 139  K 4.7  CL 100  CO2 27  GLUCOSE 98  BUN 46*  CREATININE 5.43*  CALCIUM  8.2*   PT/INR No results for input(s): LABPROT, INR in the last 72 hours. CMP     Component Value Date/Time   NA 139 06/11/2024 0028   NA 139 06/14/2014 0543   K 4.7 06/11/2024 0028   K 3.6 06/14/2014 0543   CL 100 06/11/2024 0028   CL 105 06/14/2014 0543   CO2 27 06/11/2024 0028   CO2 29 06/14/2014 0543   GLUCOSE 98 06/11/2024 0028   GLUCOSE 152 (H) 06/14/2014 0543   BUN 46 (H) 06/11/2024 0028   BUN 21 (H) 06/14/2014 0543   CREATININE 5.43 (H) 06/11/2024 0028   CREATININE 1.04 06/14/2014 0543   CALCIUM  8.2 (L) 06/11/2024 0028   CALCIUM  8.4 (L) 06/14/2014 0543   PROT 5.3 (L) 06/03/2024 0501   PROT 8.0 06/12/2014 2134   ALBUMIN 1.7 (L) 06/11/2024 0028    ALBUMIN 2.9 (L) 06/12/2014 2134   AST 13 (L) 06/03/2024 0501   AST 66 (H) 06/12/2014 2134   ALT 10 06/03/2024 0501   ALT 68 (H) 06/12/2014 2134   ALKPHOS 59 06/03/2024 0501   ALKPHOS 110 06/12/2014 2134   BILITOT 0.7 06/03/2024 0501   BILITOT 0.4 06/12/2014 2134   GFRNONAA 8 (L) 06/11/2024 0028   GFRNONAA 58 (L) 06/14/2014 0543   GFRNONAA >60 12/10/2013 0407   GFRAA 3 (L) 06/15/2020 0841   GFRAA >60 06/14/2014 0543   GFRAA >60 12/10/2013 0407   Lipase     Component Value Date/Time   LIPASE 22 05/16/2024 1308   LIPASE 535 (H) 12/11/2013 0405    Studies/Results: CT ANGIO GI BLEED Result Date: 06/11/2024 CLINICAL DATA:  Lower GI bleed, cervical cancer * Tracking Code: BO * EXAM: CTA ABDOMEN AND PELVIS WITHOUT AND WITH CONTRAST TECHNIQUE: Multidetector CT imaging of the abdomen and pelvis was performed using the standard protocol during bolus administration of intravenous contrast. Multiplanar reconstructed images and MIPs were obtained and reviewed to evaluate the vascular anatomy. RADIATION DOSE REDUCTION: This exam was performed according to the departmental dose-optimization program which includes automated exposure control, adjustment of the  mA and/or kV according to patient size and/or use of iterative reconstruction technique. CONTRAST:  75mL OMNIPAQUE  IOHEXOL  350 MG/ML SOLN COMPARISON:  05/16/2024 FINDINGS: VASCULAR Normal contour and caliber of the abdominal aorta. Moderate aortic atherosclerosis and vascular calcinosis. No evidence of aneurysm, dissection, or other acute aortic pathology. Standard branching pattern of the abdominal aorta with solitary bilateral renal arteries. Review of the MIP images confirms the above findings. NON-VASCULAR Lower Chest: Unchanged moderate left pleural effusion associated atelectasis or consolidation. Coronary artery calcifications. Aortic valve calcifications. Hepatobiliary: No focal liver abnormality is seen. Status post cholecystectomy. No  biliary dilatation. Pancreas: Unremarkable. No pancreatic ductal dilatation or surrounding inflammatory changes. Spleen: Normal in size without significant abnormality. Adrenals/Urinary Tract: Benign adenomatous thickening of the adrenal glands. Atrophic kidneys. Renal vascular calcifications without calculi. No hydronephrosis. Bladder is unremarkable. Stomach/Bowel: Stomach is within normal limits. Appendix appears normal. 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 Other: No abdominal wall hernia.  Anasarca.  No ascites. Musculoskeletal: No acute osseous findings. IMPRESSION: 1. No evidence of intraluminal contrast extravasation. 2. 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 3. Unchanged enlarged inferior mesenteric lymph nodes, concerning for nodal metastatic disease. 4. Unchanged moderate left pleural effusion and associated atelectasis or consolidation. 5. Coronary artery disease. 6. Aortic valve calcifications. Correlate for echocardiographic evidence of aortic valve dysfunction. Aortic Atherosclerosis (ICD10-I70.0). Electronically Signed   By: Marolyn JONETTA Jaksch M.D.   On: 06/11/2024 21:14    Anti-infectives: Anti-infectives (From admission, onward)    Start     Dose/Rate Route Frequency Ordered Stop   06/04/24 1215  vancomycin  (VANCOCIN ) capsule 125 mg        125 mg Oral Daily 06/04/24 1116 06/05/24 0621   05/27/24 1000  vancomycin  (VANCOCIN ) capsule 125 mg        125 mg Oral Daily 05/21/24 1019 06/04/24 0959   05/20/24 1530  amoxicillin -clavulanate (AUGMENTIN ) 500-125 MG per tablet 1 tablet        1 tablet Oral Every 12 hours 05/20/24 1519 05/29/24 2152   05/17/24  0100  fidaxomicin  (DIFICID ) tablet 200 mg        200 mg Oral 2 times daily 05/17/24 0003 05/26/24 1037   05/17/24 0100  metroNIDAZOLE  (FLAGYL ) IVPB 500 mg  Status:  Discontinued        500 mg 100 mL/hr over 60 Minutes Intravenous Every 12 hours 05/17/24 0003 05/20/24 1519        Assessment/Plan 68 yo female with C difficile colitis admitted last month and tx w/ Vanc and Dificid  as well as imaging evidence of a colouterine fistula that our team previously saw and recommended outpatient follow up who represented w/ new onset rectal and vaginal bleeding  - Hold anticoagulation - CTA GI bleed 9/30 with No evidence of intraluminal contrast extravasation.  - No indication for urgent surgical intervention. She appears stable at this time and without signs of peritonitis  - Recommend GI consult for colonoscopy - If not bleeding on Colonoscopy and continued BRBPR, consider IR consult - Trend hgb and monitor hemodynamics. Tansfuse for hgb < 7 - Noted radiographic evidence of colouterine fistula with stool in the endometrial cavity and vagina. Mod L pleural effusion and mesenteric lymph nodes.  Consider Colonoscopy as noted above.   FEN - NPO for GI eval. VTE - SCDs, hold Eliquis  for anemia  ID - Seen by ID 9/30. No current abx.   - Per TRH -  Hx C. Diff - Per GI and ID. Imodium. No current abx.  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    LOS: 27 days    Ozell CHRISTELLA Shaper, [facility] 06/12/2024, 8:47 AM Please see Amion for pager number during  day hours 7:00am-4:30pm

## 2024-06-12 NOTE — Progress Notes (Signed)
 Physical Therapy Treatment Patient Details Name: Tracy Jennings MRN: [id number] DOB: Jul 21, 1956 Today's Date: 06/12/2024   History of Present Illness Patient is 68 yo female admitted on 05/16/24 for generalized weakness concerns for colouterine fistula. + C. Diff. PMH significant for dementia, HTN, DM, HLD, cervical cancer, recurrent pancreatitis, ESRD on HD.    PT Comments  Pt not making significant progress towards her physical therapy goals. Pt requesting her brother retrieve her ice cream; does not seem motivated to participate in therapy session, although cannot verbalize rationale. RN/NT present to assist with peri care, donning underwear and linen change. Pt requiring +2 max assist for bed mobility and transfers to standing. Deferred transfer to chair due to pt reporting pain and requesting to lie back down. Patient will benefit from continued inpatient follow up therapy, <3 hours/day.    If plan is discharge home, recommend the following: A lot of help with walking and/or transfers;Assist for transportation;Help with stairs or ramp for entrance;Supervision due to cognitive status;Assistance with cooking/housework   Can travel by private vehicle     No  Equipment Recommendations  None recommended by PT    Recommendations for Other Services       Precautions / Restrictions Precautions Precautions: Fall Recall of Precautions/Restrictions: Impaired Restrictions Weight Bearing Restrictions Per Provider Order: No     Mobility  Bed Mobility Overal bed mobility: Needs Assistance Bed Mobility: Rolling, Supine to Sit, Sit to Supine Rolling: Mod assist   Supine to sit: Max assist, +2 for physical assistance Sit to supine: Max assist, +2 for physical assistance   General bed mobility comments: Decreased initiation by pt    Transfers Overall transfer level: Needs assistance Equipment used: 2 person hand held assist Transfers: Sit to/from Stand Sit to Stand: Max assist, +2  physical assistance           General transfer comment: MaxA + 2 to partially stand from edge of bed    Ambulation/Gait                   Stairs             Wheelchair Mobility     Tilt Bed    Modified Rankin (Stroke Patients Only)       Balance Overall balance assessment: Needs assistance Sitting-balance support: Feet supported Sitting balance-Leahy Scale: Poor Sitting balance - Comments: frequently tries to lie back down   Standing balance support: Bilateral upper extremity supported Standing balance-Leahy Scale: Poor                              Communication Communication Communication: No apparent difficulties  Cognition Arousal: Alert Behavior During Therapy: Flat affect   PT - Cognitive impairments: History of cognitive impairments, Initiation, Awareness, Safety/Judgement                         Following commands: Impaired Following commands impaired: Follows one step commands with increased time, Follows one step commands inconsistently    Cueing Cueing Techniques: Verbal cues, Gestural cues, Tactile cues, Visual cues  Exercises      General Comments        Pertinent Vitals/Pain Pain Assessment Pain Assessment: Faces Faces Pain Scale: Hurts whole lot Pain Location: peri area with peri care, BLE Pain Descriptors / Indicators: Discomfort, Tender, Moaning Pain Intervention(s): Monitored during session    Home Living  Prior Function            PT Goals (current goals can now be found in the care plan section) Acute Rehab PT Goals Patient Stated Goal: to eat ice cream Potential to Achieve Goals: Fair Progress towards PT goals: Not progressing toward goals - comment    Frequency    Min 1X/week      PT Plan      Co-evaluation              AM-PAC PT 6 Clicks Mobility   Outcome Measure  Help needed turning from your back to your side while in a flat  bed without using bedrails?: A Lot Help needed moving from lying on your back to sitting on the side of a flat bed without using bedrails?: A Lot Help needed moving to and from a bed to a chair (including a wheelchair)?: Total Help needed standing up from a chair using your arms (e.g., wheelchair or bedside chair)?: Total Help needed to walk in hospital room?: Total Help needed climbing 3-5 steps with a railing? : Total 6 Click Score: 8    End of Session Equipment Utilized During Treatment: Gait belt Activity Tolerance: Patient limited by pain Patient left: in bed;with call bell/phone within reach;with nursing/sitter in room Nurse Communication: Mobility status PT Visit Diagnosis: Muscle weakness (generalized) (M62.81);Unsteadiness on feet (R26.81)     Time: 8885-8866 PT Time Calculation (min) (ACUTE ONLY): 19 min  Charges:    $Therapeutic Activity: 8-22 mins PT General Charges $$ ACUTE PT VISIT: 1 Visit                     Aleck Daring, PT, DPT Acute Rehabilitation Services Office [phone]    Aleck ONEIDA Daring 06/12/2024, 12:56 PM

## 2024-06-12 NOTE — Progress Notes (Signed)
 OT Cancellation Note  Patient Details Name: Tracy Jennings MRN: [id number] DOB: [birth year]   Cancelled Treatment:    Reason Eval/Treat Not Completed: Patient declined, no reason specified. Pt refused multiple times, despite encouragement from therapist and family that was present. OT will follow up next available time  Jacques Karna Loose 06/12/2024, 2:10 PM

## 2024-06-12 NOTE — Plan of Care (Signed)

## 2024-06-12 NOTE — Progress Notes (Signed)
 [facility] Progress Note   Subjective:    Had dialysis off schedule Tues - net UF 1.5L No c/os. Sleeping soundly  Transfused pRBCs yesterday. Per notes having vaginal bleeding   Objective Vitals:   06/12/24 0624 06/12/24 0831 06/12/24 0910 06/12/24 1128  BP: (!) 164/83 (!) 174/97 (!) 167/70 (!) 167/70  Pulse:  (!) 108 96   Resp:  17 16   Temp:  98.5 F (36.9 C) 98.4 F (36.9 C)   TempSrc:  Oral Oral   SpO2: 100% 98% 97%   Weight:      Height:       Physical Exam General: Frail woman, NAD. Room air. + eyelid edema Heart: RRR Lungs: CTAB Extremities: No LE edema Dialysis Access: L AVG  Additional Objective Labs: Basic Metabolic Panel: Recent Labs  Lab 06/07/24 0503 06/11/24 0028  NA 136 139  K 4.3 4.7  CL 95* 100  CO2 28 27  GLUCOSE 107* 98  BUN 30* 46*  CREATININE 4.10* 5.43*  CALCIUM  7.9* 8.2*  PHOS 4.5 3.9   Liver Function Tests: Recent Labs  Lab 06/07/24 0503 06/11/24 0028  ALBUMIN 1.6* 1.7*   CBC: Recent Labs  Lab 06/07/24 0503 06/11/24 0028 06/11/24 2150  WBC 14.2* 14.3* 16.9*  NEUTROABS  --  11.9* 14.2*  HGB 8.1* 7.1* 6.8*  HCT 26.8* 23.5* 22.2*  MCV 93.7 96.7 96.1  PLT 238 249 234   Medications:   (feeding supplement) PROSource Plus  30 mL Oral BID BM   B-complex with vitamin C  1 tablet Oral Daily   carvedilol   25 mg Oral BID WC   Chlorhexidine  Gluconate Cloth  6 each Topical Q0600   cloNIDine   0.3 mg Transdermal Weekly   darbepoetin (ARANESP ) injection - DIALYSIS  100 mcg Subcutaneous Q Mon-1800   diltiazem   360 mg Oral Daily   famotidine   20 mg Oral Daily   ferrous sulfate   325 mg Oral Daily   Gerhardt's butt cream   Topical QID   insulin  aspart  0-5 Units Subcutaneous QHS   insulin  aspart  0-9 Units Subcutaneous TID WC   isosorbide  mononitrate  30 mg Oral Daily   lactose free nutrition  237 mL Oral TID WC   losartan   50 mg Oral Daily   multivitamin  1 tablet Oral QHS   sodium chloride  flush  10-40 mL  Intracatheter Q12H   thiamine   100 mg Oral Daily    Dialysis Orders MWF [facility]  3.5hrs, 400/500, EDW 53kg, 2K/2.5Ca bath, AVG, heparin  6000 unit bolus + 400u/hr pump - Calcitriol  1.68mcg with HD - Sensipar 60mg  with HD - Micera 75mcg Q 4 weeks - per records, dose just ordered (not given) - Binder: Calcium  Acetate 667mh TID with meals   Assessment/Plan: Colo-uterine fistula: per abd CT. Initial plans for outpt elective management; Surgery re consulted 9/30 for new onset rectal/vaginal bleeding from fistula. No urgent surgical intervention. Per primary  C-diff colitis: s/p course of po vancomycin .  LUE edema: LUE duplex ordered by Hospitalist - no issue with LUE but acute non occlusive RIJ clot noted - started on eliquis .  No issues with AVG thus far. Monitor. ESRD: on HD MWF. Off schedule this week. Had HD Tues. Next HD 10/2. Resume regular MWF schedule next week.  HTN/volume: HTN on 5 BP agents, BP lower here - have stopped spironolactone . Continue volume lowering with HD. BP improving.   Anemia of ESRD: Hgb low. Transfuse prn.  Continue Aranesp  q Monday  this admit. 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 Dementia Malnutrition: NG tube placed, TF started 9/10 - stopped 9/14 (exacerbated diarrhea), NG tube now out. Eating better-ish. GOC: Palliative consulted -  ongoing loose stools, hospice considered but now eating a little better, so family has elected to stay the course, full scope for now. Technically tolerates HD, but doubt it is contributing to QOL. SNF recommended, insurance denied, family is appealing this.   Maisie Ronnald Acosta PA-C [facility] 06/12/2024,12:42 PM

## 2024-06-12 NOTE — Progress Notes (Incomplete)
 Daily Progress Note  DOA: 05/16/2024 Hospital Day: 62  Cc: colon-uterine fistula, GI bleeding  ASSESSMENT    68 year old  with C-diff infection in August ( completed course of Vanco and Dificid  in addition to another course of Vancomycin . Readmitted with abdominal pain, recurrent diarrhea, findings of  rectosigmoid thickening and a colon-uterine fistula on CT scan .  Cause of the fistulization? Unusual to develop in setting of acute infection / inflammation. No history of IBD. No bouts of diverticulitis as far as we know. She does have a history of cervical cancer with XRT which can increase risk for fistula. Also need to consider a GI / GYN malignancy especially with enlarged mesenteric lymph nodes concerning for nodal mets on CT angio last night.   Bleeding from rectum and vagina ( started 06/11/24).   Possibly colonic bleeding through fistula. Diverticular ? Malignancy? Also consider uterine neoplasm. CT angio yesterday evening was negative for active bleeding.   ESRD on HD   Loculated left pleural effusion/masslike consolidation:  Has no respiratory symptoms.  IR consulted for thoracocentesis but pleural effusion felt to be small to tap safely.  Treated with Augmentin . Repeat chest x-ray in 4 to 6 weeks.   Hypertension   Dementia   History of cervical cancer   Principal Problem:   Colouterine fistula Active Problems:   Chronic recurrent pancreatitis (HCC)   ESRD (end stage renal disease) (HCC)   Type 2 diabetes mellitus with other specified complication (HCC)   Essential hypertension   Cervical cancer (HCC)   Dementia with behavioral disturbance (HCC)   C. difficile colitis   PVD (peripheral vascular disease)   Fistula   Hematochezia   Acute on chronic anemia    PLAN   --Surgery is following.  --There is risk for bowel perforation    Subjective   No complaints today. Family not in room  Objective   GI Studies:    Recent Labs    06/11/24 0028  06/11/24 2150 06/12/24 1544  WBC 14.3* 16.9*  --   HGB 7.1* 6.8* 9.8*  HCT 23.5* 22.2* 30.3*  MCV 96.7 96.1  --   PLT 249 234  --    No results for input(s): FOLATE, VITAMINB12, FERRITIN, TIBC, IRONPCTSAT in the last 72 hours. Recent Labs    06/11/24 0028  NA 139  K 4.7  CL 100  CO2 27  GLUCOSE 98  BUN 46*  CREATININE 5.43*  CALCIUM  8.2*   Recent Labs    06/11/24 0028  ALBUMIN 1.7*      Imaging:  CT ANGIO GI BLEED CLINICAL DATA:  Lower GI bleed, cervical cancer * Tracking Code: BO *  EXAM: CTA ABDOMEN AND PELVIS WITHOUT AND WITH CONTRAST  TECHNIQUE: Multidetector CT imaging of the abdomen and pelvis was performed using the standard protocol during bolus administration of intravenous contrast. Multiplanar reconstructed images and MIPs were obtained and reviewed to evaluate the vascular anatomy.  RADIATION DOSE REDUCTION: This exam was performed according to the departmental dose-optimization program which includes automated exposure control, adjustment of the mA and/or kV according to patient size and/or use of iterative reconstruction technique.  CONTRAST:  75mL OMNIPAQUE  IOHEXOL  350 MG/ML SOLN  COMPARISON:  05/16/2024  FINDINGS: VASCULAR  Normal contour and caliber of the abdominal aorta. Moderate aortic atherosclerosis and vascular calcinosis. No evidence of aneurysm, dissection, or other acute aortic pathology. Standard branching pattern of the abdominal aorta with solitary bilateral renal arteries.  Review of the MIP images confirms the  above findings.  NON-VASCULAR  Lower Chest: Unchanged moderate left pleural effusion associated atelectasis or consolidation. Coronary artery calcifications. Aortic valve calcifications.  Hepatobiliary: No focal liver abnormality is seen. Status post cholecystectomy. No biliary dilatation.  Pancreas: Unremarkable. No pancreatic ductal dilatation or surrounding inflammatory changes.  Spleen:  Normal in size without significant abnormality.  Adrenals/Urinary Tract: Benign adenomatous thickening of the adrenal glands. Atrophic kidneys. Renal vascular calcifications without calculi. No hydronephrosis. Bladder is unremarkable.  Stomach/Bowel: Stomach is within normal limits. Appendix appears normal. 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  Lymphatic: Unchanged enlarged inferior mesenteric lymph nodes measuring up to 2.0 x 1.5 cm (series 13, image 116)  Reproductive: Unchanged frank colouterine fistula with stool in the endometrial cavity and vagina.  Other: No abdominal wall hernia.  Anasarca.  No ascites.  Musculoskeletal: No acute osseous findings.  IMPRESSION: 1. No evidence of intraluminal contrast extravasation. 2. 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 3. Unchanged enlarged inferior mesenteric lymph nodes, concerning for nodal metastatic disease. 4. Unchanged moderate left pleural effusion and associated atelectasis or consolidation. 5. Coronary artery disease. 6. Aortic valve calcifications. Correlate for echocardiographic evidence of aortic valve dysfunction.  Aortic Atherosclerosis (ICD10-I70.0).  Electronically Signed   By: Marolyn JONETTA Jaksch M.D.   On: 06/11/2024 21:14     Scheduled inpatient medications:   (feeding supplement) PROSource Plus  30 mL Oral BID BM   B-complex with vitamin C  1 tablet Oral Daily   carvedilol   25 mg Oral BID WC   Chlorhexidine  Gluconate Cloth  6 each Topical Q0600   cloNIDine   0.3 mg Transdermal Weekly   darbepoetin (ARANESP ) injection - DIALYSIS  100 mcg Subcutaneous Q Mon-1800   diltiazem   360 mg Oral Daily   famotidine   20 mg Oral Daily   ferrous sulfate   325 mg Oral Daily   Gerhardt's butt cream   Topical QID   insulin  aspart  0-5 Units Subcutaneous QHS   insulin  aspart  0-9 Units  Subcutaneous TID WC   isosorbide  mononitrate  30 mg Oral Daily   lactose free nutrition  237 mL Oral TID WC   losartan   50 mg Oral Daily   multivitamin  1 tablet Oral QHS   sodium chloride  flush  10-40 mL Intracatheter Q12H   thiamine   100 mg Oral Daily   Continuous inpatient infusions:  PRN inpatient medications: acetaminophen  **OR** acetaminophen , hydrALAZINE , liver oil-zinc  oxide, mouth rinse, oxyCODONE   Vital signs in last 24 hours: Temp:  [97.8 F (36.6 C)-100 F (37.8 C)] 98.4 F (36.9 C) (10/01 0910) Pulse Rate:  [91-108] 96 (10/01 0910) Resp:  [15-20] 16 (10/01 0910) BP: (120-198)/(53-98) 167/70 (10/01 1128) SpO2:  [97 %-100 %] 97 % (10/01 0910) Last BM Date : 06/11/24  Intake/Output Summary (Last 24 hours) at 06/12/2024 1620 Last data filed at 06/12/2024 1000 Gross per 24 hour  Intake 1007.83 ml  Output --  Net 1007.83 ml    Intake/Output from previous day: 09/30 0701 - 10/01 0700 In: 323.8 [I.V.:23.8; Blood:300] Out: 1500  Intake/Output this shift: Total I/O In: 684 [P.O.:240; I.V.:10; Blood:434] Out: -    Physical Exam:  General: Alert ***female in NAD Heart:  Regular rate and rhythm.  Pulmonary: Normal respiratory effort Abdomen: Soft, nondistended, nontender. Normal bowel sounds. Extremities: No lower extremity edema  Neurologic: Alert and oriented Psych: Pleasant. Cooperative     LOS: 27  days   Vina Dasen ,NP 06/12/2024, 4:20 PM

## 2024-06-12 NOTE — Progress Notes (Signed)
  Progress Note   Patient: Tracy Jennings FMW:[id number] DOB: November 01, 1955 DOA: 05/16/2024     27 DOS: the patient was seen and examined on 06/12/2024 at 11:55AM      Brief hospital course: 68 y.o. 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  Hospitalization prolonged due to failure to thrive, weakness, inability to complete HD in chair.  Now hospitalization complicated by bleeding from fistula.     Assessment and Plan: Bleeding from colo-uterine fistula Seems to have stopped. - Repeat angiogram if bleeding starts again - Hold Eliquis   Colo-uterine fistula General Surgery consulted and recommended outpatient management.  Unfortunately, patient unable to be discharged due to weakness and inability to sit in chair.    Anemia Transfused 2u PRBCs overnight.  Post-transfusion H/H good - Trend Hgb  Severe protein calorie malnutrition Failed trial of tube feeds due to worsening diarrhea.  Recent C diff Resolved  Masslike consolidation left lung Pleural effusion IR consulted, not enough fluid to aspirate. - Repeat chest imaging at appropriate interval  Dementia  Diabetes Glucose controlled - Continue SS corrections  Hypertension BP elevated - Continue coreg , diltiazem , clonidine , Imdur , losartan  - Continue PRN hydralazine   Right internal jugular DVT - Eliquis  on hold given bleeding  ESRD - Consult Nephrology for HD           Subjective: Tells me to leave her alone, asks why we keep messing with her.  Complains of pain all over can't localize, asks me to leave.     Physical Exam: BP (!) 190/65 (BP Location: Left Arm)   Pulse 76   Temp (!) 97.2 F (36.2 C)   Resp 17   Ht 5' 3 (1.6 m)   Wt 52.7 kg   LMP  (LMP Unknown)   SpO2 100%   BMI 20.58 kg/m   General: Pt is awake tired, keeps eyes closed  Cardiovascular: RRR, nl S1-S2,  no murmurs appreciated.   No LE edema.   Respiratory: Normal respiratory rate and rhythm.  CTAB without rales or wheezes. Abdominal: Abdomen diffusely tender but no guarding or rebound.  No distension.    Data Reviewed: CBC shows Hgb up to 9.8  Family Communication:     Disposition: Status is: Inpatient         Author: [name], MD 06/12/2024 6:39 PM  For on call review [email or web address].

## 2024-06-12 NOTE — Progress Notes (Signed)
 [facility] ROUNDING NOTE   Subjective: CT angiogram completed yesterday without any acute bleeding.  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  No bleeding today but H/H slightly downtrending at 6.8/22; transfused 2 units.   Objective: Vital signs in last 24 hours: Temp:  [97.2 F (36.2 C)-100 F (37.8 C)] 97.2 F (36.2 C) (10/01 1739) Pulse Rate:  [76-108] 76 (10/01 1739) Resp:  [15-20] 17 (10/01 1739) BP: (120-198)/(53-98) 190/65 (10/01 1739) SpO2:  [97 %-100 %] 100 % (10/01 1739) Last BM Date : 06/11/24 General: NAD Lungs:  CTA b/l, no w/r/r Heart:  RRR, no m/r/g Abdomen:  Soft, NT, ND, +BS.   Rectal: Brown stool Ext:  No c/c/e    Intake/Output from previous day: 09/30 0701 - 10/01 0700 In: 323.8 [I.V.:23.8; Blood:300] Out: 1500  Intake/Output this shift: No intake/output data recorded.   Lab Results: Recent Labs    06/11/24 0028 06/11/24 2150 06/12/24 1544  WBC 14.3* 16.9*  --   HGB 7.1* 6.8* 9.8*  PLT 249 234  --   MCV 96.7 96.1  --    BMET Recent Labs    06/11/24 0028  NA 139  K 4.7  CL 100  CO2 27  GLUCOSE 98  BUN 46*  CREATININE 5.43*  CALCIUM  8.2*   LFT Recent Labs    06/11/24 0028  ALBUMIN 1.7*   PT/INR No results for input(s): INR in the last 72 hours.    Imaging/Other results: CT ANGIO GI BLEED Result Date: 06/11/2024 CLINICAL DATA:  Lower GI bleed, cervical cancer * Tracking Code: BO * EXAM: CTA ABDOMEN AND PELVIS WITHOUT AND WITH CONTRAST TECHNIQUE: Multidetector CT imaging of the abdomen and pelvis was performed using the standard protocol during bolus administration of intravenous contrast. Multiplanar reconstructed images and MIPs were obtained and reviewed to evaluate the vascular anatomy. RADIATION DOSE REDUCTION: This exam was performed according to the departmental  dose-optimization program which includes automated exposure control, adjustment of the mA and/or kV according to patient size and/or use of iterative reconstruction technique. CONTRAST:  75mL OMNIPAQUE  IOHEXOL  350 MG/ML SOLN COMPARISON:  05/16/2024 FINDINGS: VASCULAR Normal contour and caliber of the abdominal aorta. Moderate aortic atherosclerosis and vascular calcinosis. No evidence of aneurysm, dissection, or other acute aortic pathology. Standard branching pattern of the abdominal aorta with solitary bilateral renal arteries. Review of the MIP images confirms the above findings. NON-VASCULAR Lower Chest: Unchanged moderate left pleural effusion associated atelectasis or consolidation. Coronary artery calcifications. Aortic valve calcifications. Hepatobiliary: No focal liver abnormality is seen. Status post cholecystectomy. No biliary dilatation. Pancreas: Unremarkable. No pancreatic ductal dilatation or surrounding inflammatory changes. Spleen: Normal in size without significant abnormality. Adrenals/Urinary Tract: Benign adenomatous thickening of the adrenal glands. Atrophic kidneys. Renal vascular calcifications without calculi. No hydronephrosis. Bladder is unremarkable. Stomach/Bowel: Stomach is within normal limits. Appendix appears normal. 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 Other: No abdominal wall hernia.  Anasarca.  No ascites. Musculoskeletal: No acute osseous findings. IMPRESSION: 1. No evidence of intraluminal contrast extravasation. 2. 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 3. Unchanged  enlarged inferior mesenteric lymph nodes, concerning for nodal  metastatic disease. 4. Unchanged moderate left pleural effusion and associated atelectasis or consolidation. 5. Coronary artery disease. 6. Aortic valve calcifications. Correlate for echocardiographic evidence of aortic valve dysfunction. Aortic Atherosclerosis (ICD10-I70.0). Electronically Signed   By: Marolyn JONETTA Jaksch M.D.   On: 06/11/2024 21:14      Assessment and Plan:  1) Colo uterine fistula 2) Hematochezia 3) Vaginal bleeding Etiology for potential Colo uterine fistula not entirely clear. DDx includes prior radiation history (although would expect to have seen fistula on prior imaging in August), IBD (no known history of Crohn's), instrumentation, diverticular disease (no reported history of recurrent diverticulitis), or malignancy (does have a prior history of cervical cancer with prior radiation). Would not expect this to be sequelae of recent C. difficile.  CTA on 9/30 without acute extravasation, shows persistent Colo uterine fistula, but there is also an enlarged inferior mesenteric lymph node concerning for metastatic disease.  While elevated risk given anatomy, need to establish diagnosis for etiology of this fistula, particularly with cancer being in DDx.  - Plan for flexible sigmoidoscopy tomorrow - Per patient request, plan discussed with her sister by phone who agrees - Evaluated by Surgical service.  No acute surgical needs right now - Continue serial CBC checks - Holding Eliquis   4) Dementia - As above, plan discussed with patient's sister and consent obtained over the phone  I spent 50 minutes of time, including in depth chart review, independent review of results as outlined above, communicating results with the patient directly, face-to-face time with the patient, coordinating care, ordering studies and medications as appropriate, and documentation.    Sandor LULLA Flatter, DO  06/12/2024, 7:13 PM [facility] Pager [phone]

## 2024-06-13 ENCOUNTER — Inpatient Hospital Stay (HOSPITAL_COMMUNITY): Admitting: Anesthesiology

## 2024-06-13 ENCOUNTER — Encounter (HOSPITAL_COMMUNITY): Payer: Self-pay | Admitting: Internal Medicine

## 2024-06-13 ENCOUNTER — Inpatient Hospital Stay (HOSPITAL_COMMUNITY)

## 2024-06-13 ENCOUNTER — Encounter (HOSPITAL_COMMUNITY)
Admission: EM | Disposition: A | Discharge status: Hospice | Payer: Self-pay | Source: Home / Self Care | Attending: Family Medicine

## 2024-06-13 DIAGNOSIS — A0472 Enterocolitis due to Clostridium difficile, not specified as recurrent: Secondary | ICD-10-CM | POA: Diagnosis not present

## 2024-06-13 DIAGNOSIS — F03918 Unspecified dementia, unspecified severity, with other behavioral disturbance: Secondary | ICD-10-CM | POA: Diagnosis not present

## 2024-06-13 DIAGNOSIS — D649 Anemia, unspecified: Secondary | ICD-10-CM | POA: Diagnosis not present

## 2024-06-13 DIAGNOSIS — K6289 Other specified diseases of anus and rectum: Secondary | ICD-10-CM

## 2024-06-13 DIAGNOSIS — K921 Melena: Secondary | ICD-10-CM | POA: Diagnosis not present

## 2024-06-13 DIAGNOSIS — C539 Malignant neoplasm of cervix uteri, unspecified: Secondary | ICD-10-CM | POA: Diagnosis not present

## 2024-06-13 DIAGNOSIS — K56691 Other complete intestinal obstruction: Secondary | ICD-10-CM | POA: Diagnosis not present

## 2024-06-13 DIAGNOSIS — D631 Anemia in chronic kidney disease: Secondary | ICD-10-CM

## 2024-06-13 DIAGNOSIS — I120 Hypertensive chronic kidney disease with stage 5 chronic kidney disease or end stage renal disease: Secondary | ICD-10-CM

## 2024-06-13 DIAGNOSIS — N186 End stage renal disease: Secondary | ICD-10-CM

## 2024-06-13 DIAGNOSIS — D490 Neoplasm of unspecified behavior of digestive system: Secondary | ICD-10-CM

## 2024-06-13 DIAGNOSIS — N824 Other female intestinal-genital tract fistulae: Secondary | ICD-10-CM

## 2024-06-13 HISTORY — PX: IR EMBO ART  VEN HEMORR LYMPH EXTRAV  INC GUIDE ROADMAPPING: IMG5450

## 2024-06-13 HISTORY — PX: IR ANGIOGRAM PELVIS SELECTIVE OR SUPRASELECTIVE: IMG661

## 2024-06-13 HISTORY — PX: IR US GUIDE VASC ACCESS RIGHT: IMG2390

## 2024-06-13 HISTORY — PX: IR ANGIOGRAM SELECTIVE EACH ADDITIONAL VESSEL: IMG667

## 2024-06-13 HISTORY — PX: FLEXIBLE SIGMOIDOSCOPY: SHX5431

## 2024-06-13 HISTORY — PX: IR ANGIOGRAM VISCERAL SELECTIVE: IMG657

## 2024-06-13 LAB — COMPREHENSIVE METABOLIC PANEL WITH GFR
ALT: 10 U/L (ref 0–44)
AST: 13 U/L — ABNORMAL LOW (ref 15–41)
Albumin: 1.8 g/dL — ABNORMAL LOW (ref 3.5–5.0)
Alkaline Phosphatase: 51 U/L (ref 38–126)
Anion gap: 10 (ref 5–15)
BUN: 41 mg/dL — ABNORMAL HIGH (ref 8–23)
CO2: 25 mmol/L (ref 22–32)
Calcium: 8.4 mg/dL — ABNORMAL LOW (ref 8.9–10.3)
Chloride: 101 mmol/L (ref 98–111)
Creatinine, Ser: 4.76 mg/dL — ABNORMAL HIGH (ref 0.44–1.00)
GFR, Estimated: 10 mL/min — ABNORMAL LOW (ref >60)
Glucose, Bld: 109 mg/dL — ABNORMAL HIGH (ref 70–99)
Potassium: 4.9 mmol/L (ref 3.5–5.1)
Sodium: 136 mmol/L (ref 135–145)
Total Bilirubin: 0.5 mg/dL (ref 0.0–1.2)
Total Protein: 5.8 g/dL — ABNORMAL LOW (ref 6.5–8.1)

## 2024-06-13 LAB — CBC
HCT: 30 % — ABNORMAL LOW (ref 36.0–46.0)
HCT: 19.6 % — ABNORMAL LOW (ref 36.0–46.0)
Hemoglobin: 9.5 g/dL — ABNORMAL LOW (ref 12.0–15.0)
Hemoglobin: 6.1 g/dL — CL (ref 12.0–15.0)
MCH: 29.3 pg (ref 26.0–34.0)
MCH: 29.6 pg (ref 26.0–34.0)
MCHC: 31.7 g/dL (ref 30.0–36.0)
MCHC: 31.1 g/dL (ref 30.0–36.0)
MCV: 92.6 fL (ref 80.0–100.0)
MCV: 95.1 fL (ref 80.0–100.0)
Platelets: 219 K/uL (ref 150–400)
Platelets: 163 K/uL (ref 150–400)
RBC: 3.24 MIL/uL — ABNORMAL LOW (ref 3.87–5.11)
RBC: 2.06 MIL/uL — ABNORMAL LOW (ref 3.87–5.11)
RDW: 21.1 % — ABNORMAL HIGH (ref 11.5–15.5)
RDW: 20.9 % — ABNORMAL HIGH (ref 11.5–15.5)
WBC: 17.2 K/uL — ABNORMAL HIGH (ref 4.0–10.5)
WBC: 21.9 K/uL — ABNORMAL HIGH (ref 4.0–10.5)
nRBC: 0.1 % (ref 0.0–0.2)
nRBC: 0.1 % (ref 0.0–0.2)

## 2024-06-13 LAB — GLUCOSE, CAPILLARY
Glucose-Capillary: 86 mg/dL (ref 70–99)
Glucose-Capillary: 84 mg/dL (ref 70–99)
Glucose-Capillary: 107 mg/dL — ABNORMAL HIGH (ref 70–99)

## 2024-06-13 LAB — PREPARE RBC (CROSSMATCH)

## 2024-06-13 SURGERY — SIGMOIDOSCOPY, FLEXIBLE
Anesthesia: Monitor Anesthesia Care

## 2024-06-13 MED ORDER — FENTANYL CITRATE (PF) 100 MCG/2ML IJ SOLN
INTRAMUSCULAR | Status: AC
  Filled 2024-06-13: qty 2

## 2024-06-13 MED ORDER — DEXAMETHASONE SODIUM PHOSPHATE 10 MG/ML IJ SOLN
INTRAMUSCULAR | Status: DC | PRN
  Administered 2024-06-13: 4 mg via INTRAVENOUS

## 2024-06-13 MED ORDER — PHENYLEPHRINE 80 MCG/ML (10ML) SYRINGE FOR IV PUSH (FOR BLOOD PRESSURE SUPPORT)
PREFILLED_SYRINGE | INTRAVENOUS | Status: DC | PRN
Start: 2024-06-13 — End: 2024-06-13
  Administered 2024-06-13 (×4): 80 ug via INTRAVENOUS

## 2024-06-13 MED ORDER — IOHEXOL 300 MG/ML  SOLN
100.0000 mL | Freq: Once | INTRAMUSCULAR | Status: AC | PRN
  Administered 2024-06-13: 55 mL via INTRA_ARTERIAL

## 2024-06-13 MED ORDER — LIDOCAINE-EPINEPHRINE 1 %-1:100000 IJ SOLN
20.0000 mL | Freq: Once | INTRAMUSCULAR | Status: AC
  Administered 2024-06-13: 10 mL via INTRADERMAL

## 2024-06-13 MED ORDER — ANTICOAGULANT SODIUM CITRATE 4% (200MG/5ML) IV SOLN: 5.0000 mL | Status: DC | PRN

## 2024-06-13 MED ORDER — ALBUMIN HUMAN 5 % IV SOLN
INTRAVENOUS | Status: DC | PRN
Start: 2024-06-13 — End: 2024-06-13

## 2024-06-13 MED ORDER — LIDOCAINE-PRILOCAINE 2.5-2.5 % EX CREA: 1.0000 | TOPICAL_CREAM | CUTANEOUS | Status: DC | PRN

## 2024-06-13 MED ORDER — IOHEXOL 300 MG/ML  SOLN
100.0000 mL | Freq: Once | INTRAMUSCULAR | Status: AC | PRN
  Administered 2024-06-13: 45 mL via INTRA_ARTERIAL

## 2024-06-13 MED ORDER — ALTEPLASE 2 MG IJ SOLR: 2.0000 mg | Freq: Once | INTRAMUSCULAR | Status: DC | PRN

## 2024-06-13 MED ORDER — GELATIN ABSORBABLE 12-7 MM EX MISC
CUTANEOUS | Status: AC
  Filled 2024-06-13: qty 2

## 2024-06-13 MED ORDER — LIDOCAINE HCL 1 % IJ SOLN
INTRAMUSCULAR | Status: AC
  Filled 2024-06-13: qty 20

## 2024-06-13 MED ORDER — FENTANYL CITRATE (PF) 100 MCG/2ML IJ SOLN
25.0000 ug | INTRAMUSCULAR | Status: DC | PRN
  Administered 2024-06-13: 25 ug via INTRAVENOUS

## 2024-06-13 MED ORDER — SODIUM CHLORIDE 0.9 % IV SOLN
INTRAVENOUS | Status: DC
Start: 2024-06-13 — End: 2024-06-13

## 2024-06-13 MED ORDER — ONDANSETRON HCL 4 MG/2ML IJ SOLN
INTRAMUSCULAR | Status: DC | PRN
  Administered 2024-06-13: 4 mg via INTRAVENOUS

## 2024-06-13 MED ORDER — OXYCODONE HCL 5 MG PO TABS: 5.0000 mg | ORAL_TABLET | Freq: Once | ORAL | Status: DC | PRN

## 2024-06-13 MED ORDER — SODIUM CHLORIDE 0.9 % IV SOLN: INTRAVENOUS | Status: DC | PRN

## 2024-06-13 MED ORDER — MEPERIDINE HCL 25 MG/ML IJ SOLN: 6.2500 mg | INTRAMUSCULAR | Status: DC | PRN

## 2024-06-13 MED ORDER — SODIUM CHLORIDE 0.9% IV SOLUTION: Freq: Once | INTRAVENOUS | Status: AC

## 2024-06-13 MED ORDER — LIDOCAINE HCL (PF) 1 % IJ SOLN: 5.0000 mL | INTRAMUSCULAR | Status: DC | PRN

## 2024-06-13 MED ORDER — ONDANSETRON HCL 4 MG/2ML IJ SOLN: 4.0000 mg | Freq: Once | INTRAMUSCULAR | Status: DC | PRN

## 2024-06-13 MED ORDER — PROPOFOL 500 MG/50ML IV EMUL
INTRAVENOUS | Status: DC | PRN
  Administered 2024-06-13 (×4): 10 mg via INTRAVENOUS
  Administered 2024-06-13 (×2): 20 mg via INTRAVENOUS
  Administered 2024-06-13 (×2): 10 mg via INTRAVENOUS
  Administered 2024-06-13: 25 mg via INTRAVENOUS
  Administered 2024-06-13: 10 mg via INTRAVENOUS
  Administered 2024-06-13: 125 ug/kg/min via INTRAVENOUS

## 2024-06-13 MED ORDER — OXYCODONE HCL 5 MG/5ML PO SOLN: 5.0000 mg | Freq: Once | ORAL | Status: DC | PRN

## 2024-06-13 MED ORDER — PHENYLEPHRINE HCL-NACL 20-0.9 MG/250ML-% IV SOLN
INTRAVENOUS | Status: DC | PRN
  Administered 2024-06-13: 30 ug/min via INTRAVENOUS

## 2024-06-13 MED ORDER — ROCURONIUM BROMIDE 10 MG/ML (PF) SYRINGE
PREFILLED_SYRINGE | INTRAVENOUS | Status: DC | PRN
Start: 2024-06-13 — End: 2024-06-13
  Administered 2024-06-13: 50 mg via INTRAVENOUS

## 2024-06-13 MED ORDER — PROPOFOL 10 MG/ML IV BOLUS
INTRAVENOUS | Status: DC | PRN
Start: 2024-06-13 — End: 2024-06-13
  Administered 2024-06-13: 100 mg via INTRAVENOUS

## 2024-06-13 MED ORDER — PENTAFLUOROPROP-TETRAFLUOROETH EX AERO: 1.0000 | INHALATION_SPRAY | CUTANEOUS | Status: DC | PRN

## 2024-06-13 MED ORDER — SUGAMMADEX SODIUM 200 MG/2ML IV SOLN
INTRAVENOUS | Status: DC | PRN
  Administered 2024-06-13: 125 mg via INTRAVENOUS

## 2024-06-13 MED ORDER — ALBUMIN HUMAN 5 % IV SOLN
INTRAVENOUS | Status: AC
  Filled 2024-06-13: qty 250

## 2024-06-13 NOTE — Consult Note (Signed)
 Chief Complaint: Post-operative rectal bleeding after flexible sigmoidoscopy biopsy - IR consulted for pelvic angiogram and possible embolization  Referring Provider(s): Cirigliano, Vito V, DO   Supervising Physician: Jenna Hacker  Patient Status: [facility] - In-pt  History of Present Illness: Tracy Jennings is a 68 y.o. female with hx of anemia, CKD4, DM2, ESRD, HTN, cervical cancer previously on chemotherapy. Initially presented to [facility] ED on 04/28/24, admitted and found to have c diff colitis. Was then discharged 05/05/24. Returned back to the ED 05/16/24 and having N/V/D and weakness. 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 Patient was then transferred to [facility] for higher level of care. Stay here has been prolonged and complicated by c diff and persistent leukocytosis. GI was consulted on 06/11/24 as patient had began passing clots from her vagina and rectum, unclear source of bleeding. A CTA was obtained showing enlarged inferior mesenteric lymph nodes alone with persistent colo-uterine fistula. Today, GI took patient for flexible sigmoidoscopy and biopsy of node vs mass of distal colon. Patient experience large amount of bleeding after biopsy. IR has been consulted for pelvic angiogram and biopsy.  Pt sedated with anesthesia at bedside. Spoke with sister on phone about angiogram/embolization with approval/consent to proceed. All questions answered.   Patient is Full Code  Past Medical History:  Diagnosis Date   Anemia 04/2018   low iron. to be started on supplements   Cervical cancer (HCC)    CKD (chronic kidney disease)    Stage IV   Complication of anesthesia    receceived too much anesthesia, that she was in coma for a couple days    COVID-19 virus detected 10/28/2019   Diabetes mellitus without complication (HCC)    type II   ESRD (end stage renal disease) (HCC)    Heart murmur    followed as a child only   HSIL (high grade squamous  intraepithelial lesion) on Pap smear of cervix    Hyperlipidemia associated with type 2 diabetes mellitus (HCC)    Hypertension    Pancreatitis    Peripheral vascular disease     Past Surgical History:  Procedure Laterality Date   AMPUTATION TOE Left 2013   2nd toe. tip of toe (toe nail was infected)   AV FISTULA PLACEMENT Left 05/11/2018   Procedure: ARTERIOVENOUS (AV) FISTULA CREATION;  Surgeon: Jama Hacker MATSU, MD;  Location: [facility] ORS;  Service: Vascular;  Laterality: Left;   CATARACT EXTRACTION     CERVICAL CONIZATION W/BX N/A 04/08/2020   Procedure: CONIZATION CERVIX WITH BIOPSY;  Surgeon: Mancil Barter, MD;  Location: [facility] ORS;  Service: Gynecology;  Laterality: N/A;   CHOLECYSTECTOMY  2014   COLONOSCOPY     COLONOSCOPY WITH PROPOFOL  N/A 01/10/2018   Procedure: COLONOSCOPY WITH PROPOFOL ;  Surgeon: Toledo, Ladell POUR, MD;  Location: [facility] ENDOSCOPY;  Service: Gastroenterology;  Laterality: N/A;   DIALYSIS/PERMA CATHETER INSERTION N/A 05/21/2018   Procedure: DIALYSIS/PERMA CATHETER INSERTION;  Surgeon: Marea Selinda RAMAN, MD;  Location: [facility] INVASIVE CV LAB;  Service: Cardiovascular;  Laterality: N/A;   DIALYSIS/PERMA CATHETER INSERTION N/A 07/14/2020   Procedure: DIALYSIS/PERMA CATHETER INSERTION;  Surgeon: Marea Selinda RAMAN, MD;  Location: [facility] INVASIVE CV LAB;  Service: Cardiovascular;  Laterality: N/A;   DIALYSIS/PERMA CATHETER REMOVAL N/A 04/11/2019   Procedure: DIALYSIS/PERMA CATHETER REMOVAL;  Surgeon: Marea Selinda RAMAN, MD;  Location: [facility] INVASIVE CV LAB;  Service: Cardiovascular;  Laterality: N/A;   EYE SURGERY Bilateral 2018   cataract extractions   IR  IMAGING GUIDED PORT INSERTION  06/05/2020   THROMBECTOMY W/ EMBOLECTOMY  05/11/2018   Procedure: THROMBECTOMY ARTERIOVENOUS FISTULA;  Surgeon: Jama Cordella MATSU, MD;  Location: [facility] ORS;  Service: Vascular;;   TUBAL LIGATION  1984    Allergies: Amlodipine , Hctz [hydrochlorothiazide], Heparin , Lmw heparin , Other, Sulfa antibiotics, Lactose,  Dairy aid [tilactase], and Hydralazine   Medications: Prior to Admission medications   Medication Sig Start Date End Date Taking? Authorizing Provider  acetaminophen  (TYLENOL ) 325 MG tablet Take 2 tablets (650 mg total) by mouth every 4 (four) hours as needed for mild pain (pain score 1-3) or fever (or temp > 37.5 C (99.5 F)). 04/02/24   Dorinda Drue DASEN, MD  aspirin  EC 81 MG tablet Take 1 tablet (81 mg total) by mouth daily. Swallow whole. 04/02/24   Dorinda Drue DASEN, MD  atorvastatin  (LIPITOR) 40 MG tablet Take 1 tablet (40 mg total) by mouth daily. 04/03/24   Dorinda Drue DASEN, MD  b complex-vitamin c-folic acid  (NEPHRO-VITE) 0.8 MG TABS tablet Take 1 tablet by mouth at bedtime. 05/05/24   Franchot Novel, MD  bisacodyl  (DULCOLAX) 5 MG EC tablet Take 1 tablet (5 mg total) by mouth daily as needed for moderate constipation. 04/02/24   Dorinda Drue DASEN, MD  carvedilol  (COREG ) 25 MG tablet Take 1 tablet (25 mg total) by mouth 2 (two) times daily with a meal. 04/01/22   Krishnan, Sendil K, MD  cloNIDine  (CATAPRES  - DOSED IN MG/24 HR) 0.3 mg/24hr patch Place 1 patch (0.3 mg total) onto the skin once a week. 04/07/24   Dorinda Drue DASEN, MD  diltiazem  (CARDIZEM  CD) 180 MG 24 hr capsule Take 1 capsule (180 mg total) by mouth daily. 04/03/24   Dorinda Drue DASEN, MD  doxycycline (VIBRAMYCIN) 100 MG capsule Take 100 mg by mouth 2 (two) times daily. 04/23/24   [provider]  ferrous sulfate  325 (65 FE) MG tablet Take 325 mg by mouth daily. 04/23/24   [provider]  hydrALAZINE  (APRESOLINE ) 25 MG tablet Take 1 tablet (25 mg total) by mouth every 6 (six) hours as needed (SBP >160). 04/02/24   Dorinda Drue DASEN, MD  isosorbide  mononitrate (IMDUR ) 120 MG 24 hr tablet Take 120 mg by mouth daily. Patient not taking: Reported on 05/16/2024 04/28/24   [provider]  losartan  (COZAAR ) 100 MG tablet SMARTSIG:1 Tablet(s) By Mouth Every Evening 02/03/22   [provider]  pantoprazole  (PROTONIX ) 40 MG  tablet Take 1 tablet (40 mg total) by mouth daily. 04/02/24   Dorinda Drue DASEN, MD  QUEtiapine  (SEROQUEL ) 100 MG tablet Take 1 tablet (100 mg total) by mouth at bedtime. Patient not taking: Reported on 05/16/2024 04/02/24   Dorinda Drue DASEN, MD  QUEtiapine  (SEROQUEL ) 25 MG tablet Take 1 tablet (25 mg total) by mouth every 8 (eight) hours as needed (psychosis/anxiety). Patient not taking: Reported on 05/16/2024 04/02/24   Dorinda Drue DASEN, MD  QUEtiapine  (SEROQUEL ) 25 MG tablet Take 0.5 tablets (12.5 mg total) by mouth 3 (three) times daily. Patient not taking: Reported on 05/16/2024 05/05/24   Franchot Novel, MD  SENEXON-S 8.6-50 MG tablet Take 1 tablet by mouth daily. 04/23/24   [provider]  spironolactone  (ALDACTONE ) 25 MG tablet Take 1 tablet (25 mg total) by mouth daily. 04/03/24   Dorinda Drue DASEN, MD  traZODone  (DESYREL ) 50 MG tablet Take 1 tablet (50 mg total) by mouth at bedtime. 04/02/24   Dorinda Drue DASEN, MD  vancomycin  (VANCOCIN ) 125 MG capsule Take 1 capsule (125  mg total) by mouth 4 (four) times daily. 05/05/24   Franchot Novel, MD     Family History  Problem Relation Age of Onset   Stroke Mother    Hypertension Mother    Gout Mother    Cancer Father    Diabetes Sister    Diabetes Maternal Grandmother    Diabetes Maternal Grandfather    Diabetes Paternal Grandmother    Diabetes Paternal Grandfather     Social History   Socioeconomic History   Marital status: Married    Spouse name: clarence   Number of children: 2   Years of education: Not on file   Highest education level: Not on file  Occupational History    Comment: works nights  Tobacco Use   Smoking status: Never   Smokeless tobacco: Never  Vaping Use   Vaping status: Never Used  Substance and Sexual Activity   Alcohol use: No   Drug use: Never   Sexual activity: Not Currently  Other Topics Concern   Not on file  Social History Narrative   Not on file   Social Drivers of Health   Financial Resource  Strain: High Risk (04/24/2024)   Received from [facility]   Overall Financial Resource Strain (CARDIA)    Difficulty of Paying Living Expenses: Hard  Food Insecurity: No Food Insecurity (05/16/2024)   Hunger Vital Sign    Worried About Running Out of Food in the Last Year: Never true    Ran Out of Food in the Last Year: Never true  Recent Concern: Food Insecurity - Food Insecurity Present (04/24/2024)   Received from [facility]   Hunger Vital Sign    Within the past 12 months, you worried that your food would run out before you got the money to buy more.: Often true    Within the past 12 months, the food you bought just didn't last and you didn't have money to get more.: Sometimes true  Transportation Needs: No Transportation Needs (05/16/2024)   PRAPARE - Administrator, Civil Service (Medical): No    Lack of Transportation (Non-Medical): No  Recent Concern: Transportation Needs - Unmet Transportation Needs (04/24/2024)   Received from [facility] - Transportation    In the past 12 months, has lack of transportation kept you from medical appointments or from getting medications?: Yes    Lack of Transportation (Non-Medical): No  Physical Activity: Not on file  Stress: Not on file  Social Connections: Unknown (05/16/2024)   Social Connection and Isolation Panel    Frequency of Communication with Friends and Family: Never    Frequency of Social Gatherings with Friends and Family: Once a week    Attends Religious Services: Never    Database administrator or Organizations: No    Attends Engineer, structural: Never    Marital Status: Patient declined     Review of Systems  Unable to perform ROS: Acuity of condition    Vital Signs: BP (!) 170/62   Pulse 72   Temp 97.7 F (36.5 C) (Temporal)   Resp 20   Ht 5' 3 (1.6 m)   Wt 116 lb 2.9 oz (52.7 kg)   LMP  (LMP Unknown)   SpO2 100%   BMI 20.58 kg/m    Advance Care Plan: No documents on file  Physical Exam Vitals and nursing note reviewed.  Constitutional:      Comments: sedated  Cardiovascular:     Rate and Rhythm: Normal rate and regular rhythm.  Pulmonary:     Effort: Pulmonary effort is normal.     Breath sounds: Normal breath sounds.  Abdominal:     Palpations: Abdomen is soft.     Comments: Moderate amount of bright red blood pooling on bed near rectum, appears to be slow active bleed  Musculoskeletal:     Right lower leg: No edema.     Left lower leg: No edema.  Skin:    General: Skin is warm and dry.  Neurological:     Comments: Sedated, unable to assess     Imaging: CT ANGIO GI BLEED Result Date: 06/11/2024 CLINICAL DATA:  Lower GI bleed, cervical cancer * Tracking Code: BO * EXAM: CTA ABDOMEN AND PELVIS WITHOUT AND WITH CONTRAST TECHNIQUE: Multidetector CT imaging of the abdomen and pelvis was performed using the standard protocol during bolus administration of intravenous contrast. Multiplanar reconstructed images and MIPs were obtained and reviewed to evaluate the vascular anatomy. RADIATION DOSE REDUCTION: This exam was performed according to the departmental dose-optimization program which includes automated exposure control, adjustment of the mA and/or kV according to patient size and/or use of iterative reconstruction technique. CONTRAST:  75mL OMNIPAQUE  IOHEXOL  350 MG/ML SOLN COMPARISON:  05/16/2024 FINDINGS: VASCULAR Normal contour and caliber of the abdominal aorta. Moderate aortic atherosclerosis and vascular calcinosis. No evidence of aneurysm, dissection, or other acute aortic pathology. Standard branching pattern of the abdominal aorta with solitary bilateral renal arteries. Review of the MIP images confirms the above findings. NON-VASCULAR Lower Chest: Unchanged moderate left pleural effusion associated atelectasis or consolidation. Coronary artery calcifications. Aortic valve calcifications. Hepatobiliary: No  focal liver abnormality is seen. Status post cholecystectomy. No biliary dilatation. Pancreas: Unremarkable. No pancreatic ductal dilatation or surrounding inflammatory changes. Spleen: Normal in size without significant abnormality. Adrenals/Urinary Tract: Benign adenomatous thickening of the adrenal glands. Atrophic kidneys. Renal vascular calcifications without calculi. No hydronephrosis. Bladder is unremarkable. Stomach/Bowel: Stomach is within normal limits. Appendix appears normal. 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 Other: No abdominal wall hernia.  Anasarca.  No ascites. Musculoskeletal: No acute osseous findings. IMPRESSION: 1. No evidence of intraluminal contrast extravasation. 2. 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 3. Unchanged enlarged inferior mesenteric lymph nodes, concerning for nodal metastatic disease. 4. Unchanged moderate left pleural effusion and associated atelectasis or consolidation. 5. Coronary artery disease. 6. Aortic valve calcifications. Correlate for echocardiographic evidence of aortic valve dysfunction. Aortic Atherosclerosis (ICD10-I70.0). Electronically Signed   By: Marolyn JONETTA Jaksch M.D.   On: 06/11/2024 21:14   DG Abd Portable 1V Result Date: 05/22/2024 CLINICAL DATA:  [id number] Encounter for feeding tube placement [id number] EXAM: PORTABLE ABDOMEN - 1 VIEW COMPARISON:  05/16/2024. FINDINGS: The bowel gas pattern is non-obstructive. No evidence of pneumoperitoneum, within the limitations of a supine film. No acute osseous abnormalities. There is small left pleural effusion blunting the left lateral costophrenic angle. No significant right  pleural effusion. Surgical changes, devices, tubes and lines: Weighted Dobbhoff tube noted coursing below the left hemidiaphragm with its tip overlying the left lower abdomen, over the region of distal stomach. Right-sided CT Port-A-Cath is seen with its tip overlying the lower  portion of superior vena cava. IMPRESSION: *Weighted Dobbhoff tube noted coursing below the left hemidiaphragm with its tip overlying the left lower abdomen, over the region of distal stomach. Electronically Signed   By: Ree Molt M.D.   On: 05/22/2024 15:35   CT CHEST WO CONTRAST Result Date: 05/20/2024 CLINICAL DATA:  Pleural effusion assess for lung mass EXAM: CT CHEST WITHOUT CONTRAST TECHNIQUE: Multidetector CT imaging of the chest was performed following the standard protocol without IV contrast. RADIATION DOSE REDUCTION: This exam was performed according to the departmental dose-optimization program which includes automated exposure control, adjustment of the mA and/or kV according to patient size and/or use of iterative reconstruction technique. COMPARISON:  Chest x-ray 05/19/2024, chest CT 07/15/2022, CT 05/16/2024, FINDINGS: Cardiovascular: Limited assessment without intravenous contrast. Moderate aortic atherosclerosis. No aneurysm. Right-sided central venous port with tip in the SVC. Multi vessel coronary vascular calcification. Normal cardiac size. No sizable pericardial effusion. Mediastinum/Nodes: Patent trachea. No obvious thyroid mass. No suspicious lymph nodes. Esophagus within normal limits. Lungs/Pleura: Trace right-sided pleural effusion. Small slightly loculated appearing left pleural effusion. Subpleural rounded consolidation at the lingula measures about 18 x 15 mm on series 7, image 69. Ovoid masslike consolidation at the anterior left lung base, contiguous with curvilinear consolidation posteriorly. Nodular consolidation anteriorly measures up to 3.2 cm. Elsewhere patchy areas of mild bilateral ground-glass  density. Upper Abdomen: See previously performed abdominopelvic CT Musculoskeletal: Gene considerable generalized subcutaneous edema. No acute osseous abnormality. Chronic compression deformity and loss of disc space at T6-T7 which may be related to remote trauma or infection. Advanced disc space narrowing T3 through T6. IMPRESSION: 1. Small slightly loculated appearing left pleural effusion. Trace right-sided pleural effusion. 2. Subpleural rounded consolidation at the lingula measuring up to 18 mm. Ovoid masslike consolidation at the anterior left lung base, contiguous with curvilinear consolidation posteriorly. Appearance is suggestive of rounded atelectasis, however surveillance on CT follow-up is recommended to exclude nodule, given history of prior malignancy. 3. Elsewhere patchy areas of mild bilateral ground-glass density, either infectious or inflammatory 4. Considerable generalized subcutaneous edema consistent with anasarca. 5. Aortic atherosclerosis. Aortic Atherosclerosis (ICD10-I70.0). Electronically Signed   By: Luke Bun M.D.   On: 05/20/2024 23:27   IR US  CHEST Result Date: 05/20/2024 INDICATION: 68 year old female with renal dysfunction, C diff colitis with left pleural effusion by abdominopelvic CT imaging. Left thoracentesis requested. EXAM: CHEST ULTRASOUND COMPARISON:  None Available. FINDINGS: Small left-sided pleural effusion. No significant pocket of fluid or percutaneous window to allow safe thoracentesis. Risks outweigh the benefits. IMPRESSION: Small left-sided pleural effusion with no safe window for percutaneous access Performed by: Kacie Matthews PA-C Electronically Signed   By: CHRISTELLA.  Shick M.D.   On: 05/20/2024 11:23   VAS US  UPPER EXTREMITY VENOUS DUPLEX Result Date: 05/19/2024 UPPER VENOUS STUDY  Patient Name:  Tracy Jennings  Date of Exam:   05/19/2024 Medical Rec #: [id number]         Accession #:    [id number] Date of Birth: 28-Sep-1955         Patient Gender: F Patient Age:    54 years Exam Location:  [facility] Procedure:      VAS US  UPPER EXTREMITY VENOUS DUPLEX Referring Phys: MIGNON GONFA --------------------------------------------------------------------------------  Indications: Left forearm swelling Risk Factors: Cancer cervical, on chemotherapy ESRD on dialysis. Limitations: Poor ultrasound/tissue interface, musculoskeletal features and Patient with dementia/non compliant with directions. Had to call staff to help with repositioning. Comparison Study: No prior study on file  Performing Technologist: Alberta Lis RVS  Examination Guidelines: A complete evaluation includes B-mode imaging, spectral Doppler, color Doppler, and power Doppler as needed of all accessible portions of each vessel. Bilateral testing is considered an integral part of a complete examination. Limited examinations for reoccurring indications may be performed as noted.  Right Findings: +----------+------------+---------+-----------+----------+---------------------+ RIGHT     CompressiblePhasicitySpontaneousProperties       Summary        +----------+------------+---------+-----------+----------+---------------------+ IJV         Partial                                         Acute         +----------+------------+---------+-----------+----------+---------------------+ Subclavian                                           Not well visualized                                                      secondary to port in                                                            chest and                                                              musculoskeletal                                                              features        +----------+------------+---------+-----------+----------+---------------------+  Left Findings: +----------+------------+---------+-----------+----------+--------------+ LEFT       CompressiblePhasicitySpontaneousProperties   Summary     +----------+------------+---------+-----------+----------+--------------+ IJV           Full       Yes       Yes                             +----------+------------+---------+-----------+----------+--------------+ Subclavian    Full       Yes       No                              +----------+------------+---------+-----------+----------+--------------+ Axillary                 Yes       Yes                             +----------+------------+---------+-----------+----------+--------------+  Radial        Full                                                 +----------+------------+---------+-----------+----------+--------------+ Ulnar         Full                                                 +----------+------------+---------+-----------+----------+--------------+ Cephalic                                            Not visualized +----------+------------+---------+-----------+----------+--------------+ Basilic       Full                                                 +----------+------------+---------+-----------+----------+--------------+  Summary:  Right: Acute, non occlusive DVT in the right IJV.  Left: No evidence of deep vein thrombosis in the visualized veins of the left upper extremity. No evidence of superficial vein thrombosis in the visualized veins of the upper extremity. Significant subcutaneous edema throughout the left upper extremity. Acoustic shadowing throughout left upper arm. Limited visualization of dialysis access, but it appears patent.  *See table(s) above for measurements and observations.  Diagnosing physician: Gaile New MD Electronically signed by Gaile New MD on 05/19/2024 at 8:27:08 PM.    Final    DG Forearm Left Result Date: 05/19/2024 CLINICAL DATA:  Forearm swelling. EXAM: LEFT FOREARM - 2 VIEW COMPARISON:  None Available. FINDINGS: Proximal forearm edema. No  underlying osseous erosion to suggest osteomyelitis. IMPRESSION: Proximal forearm swelling.  No underlying osseous abnormality. Electronically Signed   By: Newell Eke M.D.   On: 05/19/2024 13:33   DG Chest 2 View Result Date: 05/19/2024 CLINICAL DATA:  Pleural effusion, left arm swelling. EXAM: CHEST - 2 VIEW COMPARISON:  05/16/2024 and CT chest 07/15/2022. FINDINGS: Right IJ Port-A-Cath terminates in the SVC. Heart is enlarged, stable. Thoracic aorta is calcified. Lungs are low in volume with central pulmonary vascular congestion and a moderate layering left pleural effusion. Left basilar collapse/consolidation. Findings are similar to 05/16/2024. IMPRESSION: 1. Moderately layering with pleural effusion with left basilar collapse/consolidation, due to atelectasis or pneumonia. 2. Central pulmonary vascular congestion. Electronically Signed   By: Newell Eke M.D.   On: 05/19/2024 13:32   CT ABDOMEN PELVIS WO CONTRAST Result Date: 05/16/2024 CLINICAL DATA:  Weakness.  C diff. EXAM: CT ABDOMEN AND PELVIS WITHOUT CONTRAST TECHNIQUE: Multidetector CT imaging of the abdomen and pelvis was performed following the standard protocol without IV contrast. RADIATION DOSE REDUCTION: This exam was performed according to the departmental dose-optimization program which includes automated exposure control, adjustment of the mA and/or kV according to patient size and/or use of iterative reconstruction technique. COMPARISON:  Radiograph earlier today.  CT 04/28/2024 FINDINGS: Lower chest: Increased bilateral pleural effusions, left greater than right. An area of rounded density within the anterior left lower lobe measuring 3.1 cm, series 4, image 8, is more rounded mid left amorphous than  on prior exam. Hepatobiliary: Unremarkable unenhanced appearance of the liver. Post cholecystectomy with stable biliary tree. Pancreas: No ductal dilatation or inflammation. Spleen: Normal in size without focal abnormality.  Adrenals/Urinary Tract: Stable adrenal thickening. Chronic bilateral renal parenchymal atrophy. No hydronephrosis or renal stone. Minimally distended urinary bladder, diffusely thick walled. Stomach/Bowel: Persistent circumferential wall thickening of the rectosigmoid which is decompressed. Immediately adjacent is an area of colonic distention with stool, however also demonstrates wall thickening. There is a moderate to large volume of formed stool throughout the remainder of the colon, without additional sites of colonic inflammation. No small bowel obstruction or small bowel inflammation. Normal appendix tentatively visualized. Stomach is nondistended Vascular/Lymphatic: Advanced aortic and branch atherosclerosis. No abdominopelvic adenopathy. Reproductive: There is a large amount of air within the endometrial canal, series 2, image 69 and series 6, image 58. 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 Large uterine soft tissue defect with stool in the region of suspected fistula, series 2 image 69 and series 6, image 59. Marked uterine calcifications. Other: Generalized body wall edema, progressive from prior exam. Small volume abdominopelvic ascites. No free intra-abdominal air. Musculoskeletal: The bones are subjectively under mineralized. Stable appearance of L4-L5 with invagination of inferior L4 vertebral body impacted on L5. Stable anterolisthesis at the L4-L5 level with spinal canal narrowing. No new osseous abnormalities. IMPRESSION: 1. Circumferential wall thickening of the rectosigmoid colon suspicious for colitis. 2. Findings consistent with colo-uterine fistula, large defect in the posterior uterus which contains stool, and air in the endometrial canal. Recommend surgical consultation. 3. Increased bilateral pleural effusions, left greater than right. An area of rounded density within the  anterior left lower lobe measuring 3.1 cm is more rounded than on prior exam. This may represent rounded atelectasis, however underlying mass is not excluded. Recommend follow-up chest CT after course of treatment. 4. Generalized body wall edema, progressive from prior exam. Small volume abdominopelvic ascites. Aortic Atherosclerosis (ICD10-I70.0). Electronically Signed   By: Andrea Gasman M.D.   On: 05/16/2024 16:49   DG Abdomen 1 View Result Date: 05/16/2024 CLINICAL DATA:  Toxic megacolon. EXAM: ABDOMEN - 1 VIEW COMPARISON:  March 21, 2024.  April 28, 2024. FINDINGS: Status post cholecystectomy. No abnormal bowel dilatation is noted. Large amount of stool seen throughout the colon and rectum. IMPRESSION: Large stool burden. No abnormal bowel dilatation. Electronically Signed   By: Lynwood Landy Raddle M.D.   On: 05/16/2024 16:23   DG Chest Portable 1 View Result Date: 05/16/2024 CLINICAL DATA:  Weakness. EXAM: PORTABLE CHEST 1 VIEW COMPARISON:  04/01/2024. FINDINGS: Stable cardiomediastinal contours. Stable right chest Port-A-Cath. Moderate left pleural effusion with left basilar opacities favored to reflect atelectasis, increased since the prior exam. Low lung volumes with bronchovascular crowding. No pneumothorax. No acute osseous abnormality. IMPRESSION: Moderate left pleural effusion with left basilar opacities favored to reflect atelectasis, increased since the prior exam. Electronically Signed   By: Harrietta Sherry M.D.   On: 05/16/2024 13:44    Labs:  CBC: Recent Labs    06/07/24 0503 06/11/24 0028 06/11/24 2150 06/12/24 1544 06/13/24 0355  WBC 14.2* 14.3* 16.9*  --  17.2*  HGB 8.1* 7.1* 6.8* 9.8* 9.5*  HCT 26.8* 23.5* 22.2* 30.3* 30.0*  PLT 238 249 234  --  219    COAGS: Recent Labs    03/05/24 1810  INR 1.3*  APTT 36    BMP: Recent Labs  06/03/24 0501 06/07/24 0503 06/11/24 0028 06/13/24 0355  NA 134* 136 139 136  K 5.3* 4.3 4.7 4.9  CL 96* 95* 100 101  CO2 27 28  27 25   GLUCOSE 107* 107* 98 109*  BUN 44* 30* 46* 41*  CALCIUM  7.7* 7.9* 8.2* 8.4*  CREATININE 4.58* 4.10* 5.43* 4.76*  GFRNONAA 10* 11* 8* 10*    LIVER FUNCTION TESTS: Recent Labs    06/01/24 0322 06/02/24 0433 06/03/24 0501 06/07/24 0503 06/11/24 0028 06/13/24 0355  BILITOT 0.4 0.7 0.7  --   --  0.5  AST 15 13* 13*  --   --  13*  ALT 10 10 10   --   --  10  ALKPHOS 69 63 59  --   --  51  PROT 5.3* 5.5* 5.3*  --   --  5.8*  ALBUMIN <1.5* <1.5* 1.5* 1.6* 1.7* 1.8*    TUMOR MARKERS: No results for input(s): AFPTM, CEA, CA199, CHROMGRNA in the last 8760 hours.  Assessment and Plan:  Tracy Jennings is a 68 y.o. female with hx of anemia, CKD4, DM2, ESRD, HTN, cervical cancer previously on chemotherapy. Initially presented to [facility] ED on 04/28/24, admitted and found to have c diff colitis. Was then discharged 05/05/24. Returned back to the ED 05/16/24 and having N/V/D and weakness. 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 Patient was then transferred to [facility] for higher level of care. Stay here has been prolonged and complicated by c diff and persistent leukocytosis. GI was consulted on 06/11/24 as patient had began passing clots from her vagina and rectum, unclear source of bleeding. A CTA was obtained showing enlarged inferior mesenteric lymph nodes alone with persistent colo-uterine fistula. Today, GI took patient for flexible sigmoidoscopy and biopsy of node vs mass of distal colon. Patient experience large amount of bleeding after biopsy. IR has been consulted for pelvic angiogram and biopsy.  Pt sedated with anesthesia at bedside. Spoke with sister on phone about angiogram/embolization with approval/consent to proceed. All questions answered.  The Risks and benefits of embolization were discussed with the patient including, but not limited to bleeding, infection, vascular injury, post operative pain, or contrast induced renal failure.  This  procedure involves the use of X-rays and because of the nature of the planned procedure, it is possible that we will have prolonged use of X-ray fluoroscopy.  Potential radiation risks to you include (but are not limited to) the following: - A slightly elevated risk for cancer several years later in life. This risk is typically less than 0.5% percent. This risk is low in comparison to the normal incidence of human cancer, which is 33% for women and 50% for men according to the [employer] Society. - Radiation induced injury can include skin redness, resembling a rash, tissue breakdown / ulcers and hair loss (which can be temporary or permanent).   The likelihood of either of these occurring depends on the difficulty of the procedure and whether you are sensitive to radiation due to previous procedures, disease, or genetic conditions.   IF your procedure requires a prolonged use of radiation, you will be notified and given written instructions for further action.  It is your responsibility to monitor the irradiated area for the 2 weeks following the procedure and to notify your physician if you are concerned that you have suffered a radiation induced injury.    All of the sister's questions were answered, sister is agreeable to proceed.  Consent signed and in chart.   Thank you for allowing our service to participate in Tracy Jennings 's care.  Electronically Signed: Kimble VEAR Clas, PA-C   06/13/2024, 12:50 PM      I spent a total of 40 Minutes    in face to face in clinical consultation, greater than 50% of which was counseling/coordinating care for pelvic angiogram and possible embolization.

## 2024-06-13 NOTE — Procedures (Signed)
 Interventional Radiology Procedure Note  Procedure: Arterial embolization  Complications: None  Estimated Blood Loss: < 10 mL  Findings: Left and right internal iliac artery evaluation with left internal iliac anterior division embolized with gelfoam.  IMA unremarkable for bleed.  Cordella DELENA Banner, MD

## 2024-06-13 NOTE — Anesthesia Procedure Notes (Signed)
 Procedure Name: Intubation Date/Time: 06/13/2024 1:48 PM  Performed by: Marva Lonni PARAS, CRNAPre-anesthesia Checklist: Patient identified, Emergency Drugs available, Suction available and Patient being monitored Patient Re-evaluated:Patient Re-evaluated prior to induction Oxygen Delivery Method: Circle System Utilized Preoxygenation: Pre-oxygenation with 100% oxygen Induction Type: IV induction Ventilation: Mask ventilation without difficulty Laryngoscope Size: Mac and 3 Grade View: Grade I Tube type: Oral Tube size: 7.0 mm Number of attempts: 1 Airway Equipment and Method: Stylet Placement Confirmation: ETT inserted through vocal cords under direct vision, positive ETCO2 and breath sounds checked- equal and bilateral Secured at: 21 cm Tube secured with: Tape Dental Injury: Teeth and Oropharynx as per pre-operative assessment

## 2024-06-13 NOTE — Progress Notes (Signed)
 Subjective: CC: Spoke with RN. No reported vaginal bleeding or BRBPR this AM. Patient denies abdominal pain but tells me she did have bloody vaginal d/c and bloody BM's.   Afebrile. Tachycardia resolved. No hypotension. Hgb stable after 2U PRBC yesterday. (Hgb 6.8 --> 2U PRBC --> 9.8 --> 9.5 this AM)  Objective: Vital signs in last 24 hours: Temp:  [97.2 F (36.2 C)-98.7 F (37.1 C)] 97.9 F (36.6 C) (10/02 0820) Pulse Rate:  [67-108] 70 (10/02 0820) Resp:  [14-18] 14 (10/02 0820) BP: (165-190)/(63-97) 173/63 (10/02 0820) SpO2:  [97 %-100 %] 100 % (10/02 0820) Last BM Date : 06/12/24  Intake/Output from previous day: 10/01 0701 - 10/02 0700 In: 1044 [P.O.:600; I.V.:10; Blood:434] Out: -  Intake/Output this shift: No intake/output data recorded.  PE: Gen:  Alert, NAD Abd: Soft, ND, NT.  Lab Results:  Recent Labs    06/11/24 2150 06/12/24 1544 06/13/24 0355  WBC 16.9*  --  17.2*  HGB 6.8* 9.8* 9.5*  HCT 22.2* 30.3* 30.0*  PLT 234  --  219   BMET Recent Labs    06/11/24 0028 06/13/24 0355  NA 139 136  K 4.7 4.9  CL 100 101  CO2 27 25  GLUCOSE 98 109*  BUN 46* 41*  CREATININE 5.43* 4.76*  CALCIUM  8.2* 8.4*   PT/INR No results for input(s): LABPROT, INR in the last 72 hours. CMP     Component Value Date/Time   NA 136 06/13/2024 0355   NA 139 06/14/2014 0543   K 4.9 06/13/2024 0355   K 3.6 06/14/2014 0543   CL 101 06/13/2024 0355   CL 105 06/14/2014 0543   CO2 25 06/13/2024 0355   CO2 29 06/14/2014 0543   GLUCOSE 109 (H) 06/13/2024 0355   GLUCOSE 152 (H) 06/14/2014 0543   BUN 41 (H) 06/13/2024 0355   BUN 21 (H) 06/14/2014 0543   CREATININE 4.76 (H) 06/13/2024 0355   CREATININE 1.04 06/14/2014 0543   CALCIUM  8.4 (L) 06/13/2024 0355   CALCIUM  8.4 (L) 06/14/2014 0543   PROT 5.8 (L) 06/13/2024 0355   PROT 8.0 06/12/2014 2134   ALBUMIN 1.8 (L) 06/13/2024 0355   ALBUMIN 2.9 (L) 06/12/2014 2134   AST 13 (L) 06/13/2024 0355   AST 66 (H)  06/12/2014 2134   ALT 10 06/13/2024 0355   ALT 68 (H) 06/12/2014 2134   ALKPHOS 51 06/13/2024 0355   ALKPHOS 110 06/12/2014 2134   BILITOT 0.5 06/13/2024 0355   BILITOT 0.4 06/12/2014 2134   GFRNONAA 10 (L) 06/13/2024 0355   GFRNONAA 58 (L) 06/14/2014 0543   GFRNONAA >60 12/10/2013 0407   GFRAA 3 (L) 06/15/2020 0841   GFRAA >60 06/14/2014 0543   GFRAA >60 12/10/2013 0407   Lipase     Component Value Date/Time   LIPASE 22 05/16/2024 1308   LIPASE 535 (H) 12/11/2013 0405    Studies/Results: CT ANGIO GI BLEED Result Date: 06/11/2024 CLINICAL DATA:  Lower GI bleed, cervical cancer * Tracking Code: BO * EXAM: CTA ABDOMEN AND PELVIS WITHOUT AND WITH CONTRAST TECHNIQUE: Multidetector CT imaging of the abdomen and pelvis was performed using the standard protocol during bolus administration of intravenous contrast. Multiplanar reconstructed images and MIPs were obtained and reviewed to evaluate the vascular anatomy. RADIATION DOSE REDUCTION: This exam was performed according to the departmental dose-optimization program which includes automated exposure control, adjustment of the mA and/or kV according to patient size and/or use of iterative reconstruction technique. CONTRAST:  75mL OMNIPAQUE  IOHEXOL  350 MG/ML SOLN COMPARISON:  05/16/2024 FINDINGS: VASCULAR Normal contour and caliber of the abdominal aorta. Moderate aortic atherosclerosis and vascular calcinosis. No evidence of aneurysm, dissection, or other acute aortic pathology. Standard branching pattern of the abdominal aorta with solitary bilateral renal arteries. Review of the MIP images confirms the above findings. NON-VASCULAR Lower Chest: Unchanged moderate left pleural effusion associated atelectasis or consolidation. Coronary artery calcifications. Aortic valve calcifications. Hepatobiliary: No focal liver abnormality is seen. Status post cholecystectomy. No biliary dilatation. Pancreas: Unremarkable. No pancreatic ductal dilatation or  surrounding inflammatory changes. Spleen: Normal in size without significant abnormality. Adrenals/Urinary Tract: Benign adenomatous thickening of the adrenal glands. Atrophic kidneys. Renal vascular calcifications without calculi. No hydronephrosis. Bladder is unremarkable. Stomach/Bowel: Stomach is within normal limits. Appendix appears normal. 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 Other: No abdominal wall hernia.  Anasarca.  No ascites. Musculoskeletal: No acute osseous findings. IMPRESSION: 1. No evidence of intraluminal contrast extravasation. 2. 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 3. Unchanged enlarged inferior mesenteric lymph nodes, concerning for nodal metastatic disease. 4. Unchanged moderate left pleural effusion and associated atelectasis or consolidation. 5. Coronary artery disease. 6. Aortic valve calcifications. Correlate for echocardiographic evidence of aortic valve dysfunction. Aortic Atherosclerosis (ICD10-I70.0). Electronically Signed   By: Marolyn JONETTA Jaksch M.D.   On: 06/11/2024 21:14    Anti-infectives: Anti-infectives (From admission, onward)    Start     Dose/Rate Route Frequency Ordered Stop   06/04/24 1215  vancomycin  (VANCOCIN ) capsule 125 mg        125 mg Oral Daily 06/04/24 1116 06/05/24 0621   05/27/24 1000  vancomycin  (VANCOCIN ) capsule 125 mg        125 mg Oral Daily 05/21/24 1019 06/04/24 0959   05/20/24 1530  amoxicillin -clavulanate (AUGMENTIN ) 500-125 MG per tablet 1 tablet        1 tablet Oral Every 12 hours 05/20/24 1519 05/29/24 2152   05/17/24 0100  fidaxomicin  (DIFICID ) tablet 200 mg        200 mg Oral 2 times daily  05/17/24 0003 05/26/24 1037   05/17/24 0100  metroNIDAZOLE  (FLAGYL ) IVPB 500 mg  Status:  Discontinued        500 mg 100 mL/hr over 60 Minutes Intravenous Every 12 hours 05/17/24 0003 05/20/24 1519        Assessment/Plan 68 yo female with C difficile colitis admitted last month and tx w/ Vanc and Dificid  as well as imaging evidence of a colouterine fistula that our team previously saw and recommended outpatient follow up who represented w/ new onset rectal and vaginal bleeding  - Hold anticoagulation. Suspect bleeding from fistula which could be d/t radiation since she has a hx of radiation.  - CTA GI bleed 9/30 with no evidence of intraluminal contrast extravasation.  - No indication for urgent surgical intervention. She appears stable at this time and without signs of peritonitis  - Recommend GI consult for colonoscopy  - If not bleeding on Colonoscopy and continued BRBPR, consider IR consult - Trend hgb and monitor hemodynamics. Tansfuse for hgb < 7 - Noted radiographic evidence of colouterine fistula with stool in the endometrial cavity and vagina. Mod L pleural effusion and  mesenteric lymph nodes.  - GI planning flex sig today  FEN - NPO for flex sig with GI VTE - SCDs, hold Eliquis  for anemia  ID - Seen by ID 9/30. No current abx.   - Per TRH -  Hx C. Diff - Per GI and ID. Imodium. No current abx.  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    LOS: 28 days    Ozell CHRISTELLA Shaper, [facility] 06/13/2024, 8:27 AM Please see Amion for  pager number during day hours 7:00am-4:30pm

## 2024-06-13 NOTE — Anesthesia Preprocedure Evaluation (Addendum)
 Anesthesia Evaluation  Patient identified by MRN, date of birth, ID band Patient awake    Reviewed: Allergy & Precautions, H&P , NPO status , Patient's Chart, lab work & pertinent test results  History of Anesthesia Complications (+) history of anesthetic complications (in a coma fior three days post-op)  Airway Mallampati: II       Dental  (+) Poor Dentition   Pulmonary neg pulmonary ROS, neg sleep apnea, neg COPD, Not current smoker   breath sounds clear to auscultation       Cardiovascular Exercise Tolerance: Good hypertension, Pt. on medications + Peripheral Vascular Disease  (-) Past MI and (-) CHF (-) dysrhythmias + Valvular Problems/Murmurs  Rhythm:Regular Rate:Normal  ECHO 6/25   1. Left ventricular ejection fraction, by estimation, is 55 to 60%. The  left ventricle has normal function. The left ventricle has no regional  wall motion abnormalities. There is severe left ventricular hypertrophy.  Left ventricular diastolic parameters   are consistent with Grade II diastolic dysfunction (pseudonormalization).   2. Right ventricular systolic function is normal. The right ventricular  size is normal. There is moderately elevated pulmonary artery systolic  pressure. The estimated right ventricular systolic pressure is 46.2 mmHg.   3. The mitral valve is normal in structure. No evidence of mitral valve  regurgitation. Mild mitral stenosis. The mean mitral valve gradient is 5.0  mmHg. Moderate mitral annular calcification.   4. Tricuspid valve regurgitation is mild to moderate.   5. The aortic valve is calcified. There is moderate calcification of the  aortic valve. Aortic valve regurgitation is mild to moderate. Mild to  moderate aortic valve stenosis. Aortic valve mean gradient measures 16.5  mmHg.   6. The inferior vena cava is normal in size with greater than 50%  respiratory variability, suggesting right atrial  pressure of 3 mmHg.     Neuro/Psych neg Seizures negative neurological ROS  negative psych ROS   GI/Hepatic negative GI ROS, Neg liver ROS,neg GERD  ,,  Endo/Other  diabetes, Type 2, Oral Hypoglycemic Agents    Renal/GU ESRF and DialysisRenal disease  negative genitourinary   Musculoskeletal   Abdominal   Peds  Hematology  (+) Blood dyscrasia, anemia   Anesthesia Other Findings   Reproductive/Obstetrics negative OB ROS                              Anesthesia Physical Anesthesia Plan  ASA: 4  Anesthesia Plan: MAC   Post-op Pain Management: Minimal or no pain anticipated   Induction: Intravenous  PONV Risk Score and Plan: 2 and Propofol  infusion  Airway Management Planned: Natural Airway and Simple Face Mask  Additional Equipment: None  Intra-op Plan:   Post-operative Plan:   Informed Consent: I have reviewed the patients History and Physical, chart, labs and discussed the procedure including the risks, benefits and alternatives for the proposed anesthesia with the patient or authorized representative who has indicated his/her understanding and acceptance.       Plan Discussed with: Anesthesiologist and CRNA  Anesthesia Plan Comments: (  History of Present Illness: OVIE EASTEP is a 68 y.o. female with hx of anemia, CKD4, DM2, ESRD, HTN, cervical cancer previously on chemotherapy. Initially presented to [facility] ED on 04/28/24, admitted and found to have c diff colitis. Was then discharged 05/05/24. Returned back to the ED 05/16/24 and having N/V/D and weakness. 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 Patient  was then transferred to [facility] for higher level of care. Stay here has been prolonged and complicated by c diff and persistent leukocytosis. GI was consulted on 06/11/24 as patient had began passing clots from her vagina and rectum, unclear source of bleeding. A CTA was obtained showing enlarged inferior  mesenteric lymph nodes alone with persistent colo-uterine fistula. Today, GI took patient for flexible sigmoidoscopy and biopsy of node vs mass of distal colon. Patient experience large amount of bleeding after biopsy. IR has been consulted for pelvic angiogram and biopsy.   )         Anesthesia Quick Evaluation

## 2024-06-13 NOTE — Progress Notes (Signed)
 Progress Note   Patient: Tracy Jennings FMW:[id number] DOB: Jul 17, 1956 DOA: 05/16/2024     28 DOS: the patient was seen and examined on 06/13/2024 at 9:48AM      Brief hospital course: 68 y.o. 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  See prior note, initial plan after general surgery evaluation was quick discharge with outpatient follow-up, but hospitalization prolonged due to FTT, weakness which prevented completing HD in the chair.  Subsequently patient developed bleeding from her Colo uterine fistula, and GI were consulted.     Assessment and Plan: Colo uterine fistula Acute vagino-colic bleeding General surgery consulted, recommended outpatient management.  Unfortunately patient unable to be discharged due to weakness and inability to sit in chair.  Eventually she developed bleeding from the [facility] uterine fistula, and GI were consulted.  Their differential for the fistula was likely malignancy recurrence.  Less likely considerations included radiation colitis (unlikely to develop after 4 year interval), IBD (unlikely in absence of IBD history), diverticular disease (unlikely without history diverticulitis).  No recent instrumentation.  Cdiff not related.  Underwent flex sig today that showed large rectal mass, which bled upon biopsy. - Consult IR for embolization - Consult Gen Surg and Gyn Onc for further surgical recommendations - Follow biopsy - Trend Hgb  -Hold Eliquis   History of cervical adenocarcinoma s/p Chemorad and brachytherapy, lost to follow up Masslike consolidation of left lung Pelvic lymphadenopathy Pleural effusion IR were consulted for pleural effusion, felt to respond enough fluid to aspirate. - Consult Gyn Onc - Follow biopsy -Defer outpatient PET/CT to GYN oncology  Right IJ DVT - Need to hold Eliquis  for now given bleeding - After  embolization could consider heparin  infusion without bolus  ESRD - Consult nephrology for HD  Acute blood loss anemia Baseline hemoglobin 9-10 due to ESRD.  Hemoglobin here down to 6.8 the other day, status posttransfusion 2 units PRBC on 10/1. - Trend hemoglobin  Recent C. difficile Resolved  Severe protein calorie malnutrition Failure to thrive Recently failed trial of tube feeds due to worsening of diarrhea  Diabetes Glucose controlled - Continue sliding scale corrections  Hypertension Blood pressure elevated earlier today - Continue Coreg , diltiazem , clonidine , Imdur , losartan  - As needed hydralazine        Subjective: Patient has no complaints.  She has had no bleeding overnight.  No fever.  During her flexible sigmoidoscopy today, she had a biopsy, and afterwards had some persistent bleeding.  The [facility] side of this was injected with epinephrine , but the vaginal side continued bleeding and so she was taken to IR     Physical Exam: BP (!) 81/49 (BP Location: Right Arm) Comment: neo 80mcg given by CRNA  Pulse 67   Temp 97.7 F (36.5 C)   Resp 13   Ht 5' 3 (1.6 m)   Wt 52.7 kg   LMP  (LMP Unknown)   SpO2 97%   BMI 20.58 kg/m   Thin adult female, lying in bed, interactive and appropriate RRR, no murmurs, no peripheral edema Respiratory rate normal, lungs clear without rales or wheezes Abdomen soft, no tenderness palpation or guarding, no ascites or distention   Data Reviewed: Basic metabolic panel shows findings consistent with ESRD CBC shows leukocytosis, stable anemia Discussed with Gen Surg and GI    Family Communication: Brother    Disposition: Status is: Inpatient  Author: [name], MD 06/13/2024 5:09 PM  For on call review [email or web address].

## 2024-06-13 NOTE — Progress Notes (Signed)
 06/13/24 1855  Charting Type  Charting Type Admission/Transfer Assessment  [place] Work Intensity Score (Update with each assessment and as needed)  Work Intensity Score (Level) 4  Level 4 Intensity F.Ongoing blood products or fluid resuscitation  Neurological  Neuro (WDL) X  Orientation Level Oriented to person;Disoriented to place;Disoriented to time;Disoriented to situation  Environmental consultant awareness;Memory impairment (Some commands)  Speech Slurred/Dysarthria  R Pupil Size (mm) 3  R Pupil Shape Round  R Pupil Reaction Brisk  L Pupil Size (mm) 3  L Pupil Shape Round  L Pupil Reaction Brisk  Motor Function/Sensation Assessment Grip;Motor response;Sensation;Motor strength  R Hand Grip Weak  L Hand Grip Weak   RUE Motor Response Purposeful movement  RUE Motor Strength 4  LUE Motor Response Purposeful movement  LUE Motor Strength 4  RLE Motor Response Purposeful movement  RLE Motor Strength 4  LLE Motor Response Purposeful movement  LLE Motor Strength 4  Neuro Symptoms Fatigue;Forgetful  Neuro symptoms relieved by Rest  NuDESC - Delirium Risk Factor Assessment (Complete for non-ICU patients)  Delirium Risk Factor Assessment Age greater than or equal to 65 years  NuDESC - Nursing Delirium Screening Scale (Complete for non-ICU patients)  Disorientation 2  Inappropriate Behavior 0  Inappropriate Communications 1  Illusions/hallucinations 0  Psychomotor Retardation 0  NuDESC Total Score 3  NuDESC - Delirium Prevention:  Universal Requirements (Complete for all non-ICU patients with a delirium risk factor)  Universal Precautions Initiated *See Row Information* Yes  NuDESC - Interventions for patient with NuDESC equal to or greater than 2 (Complete for non-ICU patients)  NuDESC Positive:  Interventions Continue Universal (preventative) measures;Utilize bed alarms  Respiratory  Respiratory Pattern  Regular;Unlabored  Chest Assessment Chest expansion symmetrical  Bilateral Breath Sounds Clear;Diminished  Cough None  Respiratory (WDL) X  Cardiac  Cardiac (WDL) WDL  Pulse Regular  Heart Sounds S1, S2  Jugular Venous Distention (JVD) No  ECG Monitor Yes  ECG Monitoring  ECG Heart Rate 67  First Vascular Site Assessment  #1 - Location of Site Assessment Right femoral  #1 - Vascular Site Assessment Scale Level 0  #1 - Hematoma present? No  #1 - Dressing Type Gauze (Comment)  #1 - Dressing Status Clean, Dry, Intact  Fistula / Graft Left Upper arm Arteriovenous fistula  No placement date or time found.   Orientation: Left  Access Location: Upper arm  Access Type: Arteriovenous fistula  Site Condition No complications  Fistula / Graft Assessment Present;Thrill;Bruit  Status Deaccessed  Drainage Description None  Stool Measurement/Characteristics  Bowel Incontinence Yes  Stool Descriptors Red (Bright and dark red clots)  Stool Amount Medium  Stool Source Rectum  GU Assessment  Genitourinary (WDL) X  Genitourinary Symptoms Anuria  Genitalia  Female Genitalia Intact  Drainage Description Moderate;Bright Red;Clots;Dark Red  Integumentary  Integumentary (WDL) X  Nurse assisting at admit/transfer - Full skin assessment Ayana RN  Skin Color Appropriate for ethnicity  Skin Condition Dry  Skin Integrity Other (Comment) (R femoral site)  Skin Turgor Non-tenting  Sacral Foam Prophylactic Dressing  Dressing Interventions Dressing placed and dated/protocol started  Medical Device Prophylactic Dressing  Skin Assessed Under All Medical Device Prophylactic Dressings (if applicable) Done - Skin Intact  Wound 06/13/24 1421 Other (Comment) Thigh Anterior;Proximal;Right  Date First Assessed/Time First Assessed: 06/13/24 1421   Primary Wound Type: (c) Other (Comment)  Location: Thigh  Location Orientation: Anterior;Proximal;Right  Site / Wound Assessment Clean;Dry  Peri-wound Assessment  Intact  Drainage Amount None  Dressing Type Gauze (Comment)  Dressing Status Clean, Dry, Intact  Neurological  Level of Consciousness Responds to Voice   Patient admitted to 4N12 after IR procedure. Responds to voice and is oriented to self only. Family at bedside and personal belongings with patient. Vitals recorded and assessment completed. 96.12F axillary temp. Patient skin cold and dry. Patient actively passing mix of bright and dark red clots through rectum and vagina. Ayana RN received critical result of 6.1 Hgb. Danford MD notified. See new orders. Cardiac tele applied, call bell within reach, and bed alarm on.

## 2024-06-13 NOTE — Plan of Care (Signed)
     Referral previously received for Tracy Jennings for goals of care discussion. Noted most recent palliative in-person assessment dated 06/01/2024 at which time it was recommended to back off at that time and feel free to notify for any significant changes or needs.  See chart check note from 06/11/2024 when patient appeared to be stable and pending disposition issue with insurance denying//rehab, plan to test for chair stability for dialysis given possibility of discharge home.  Chart check completed today due to patient being off the floor for multiple procedures during today and unable to see at bedside.   Chart reviewed for Recent provider notes, nurse notes, vitals, and labs.    At this time patient appears to have a change.  She did have further vaginal bleeding and imaging appears to showed enlarged lymph node.  Plans for colonoscopy today.  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  Discussing options with multidisciplinary team including possible IR embolization versus surgical involvement.  Of note it appears that the appeal for SNF denial has been overturned and 60-day grace period in place for admission to SNF/rehab.   No plan for in person follow-up today.  Will ask palliative medicine team to follow-up over the weekend.   Thank you for your referral and allowing PMT to assist in Tracy Jennings's care.    [name], NP Palliative Medicine Team Phone: [phone]   NO CHARGE

## 2024-06-13 NOTE — Consult Note (Signed)
 Gynecologic Oncology Consultation  Tracy Jennings 68 y.o. female  HPI: Tracy Jennings is a 68 year old female who presented to the [facility] ER on 05/16/2024 with abdominal pain, weakness, recent diagnosis of c diff. She was subsequently transferred to [facility] on 05/16/2024 for further workup. Imaging studies while in the ER on 05/16/2024 included: 1) Chest xray: Moderate left pleural effusion with left basilar opacities favored to reflect atelectasis, increased since the prior exam. 2) Abd 1 view xray: Large stool burden. No abnormal bowel dilatation.  3) CT AP without contrast:  -Circumferential wall thickening of the rectosigmoid colon suspicious for colitis. -Findings consistent with colo-uterine fistula, large defect in the posterior uterus which contains stool, and air in the endometrial canal. Recommend surgical consultation. -Increased bilateral pleural effusions, left greater than right. An area of rounded density within the anterior left lower lobe measuring 3.1 cm is more rounded than on prior exam. This may represent rounded atelectasis, however underlying mass is not excluded. Recommend follow-up chest CT after course of treatment. -Generalized body wall edema, progressive from prior exam. Small volume abdominopelvic ascites. -Aortic Atherosclerosis  She was seen in consultation by General Surgery with urgent surgical intervention not indicated. Her hospital course has been complicated by weakness, failure to thrive, bleeding from fistula. SNF was recommended with insurance denying this request. On 06/11/2024, she developed bleeding from the vagina and rectum with GI being consulted. CT angio was performed same day revealing: 1. No evidence of intraluminal contrast extravasation. 2. 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 3. Unchanged enlarged inferior mesenteric lymph nodes, concerning for nodal metastatic disease. 4. Unchanged moderate left  pleural effusion and associated atelectasis or consolidation. 5. Coronary artery disease. 6. Aortic valve calcifications. Correlate for echocardiographic evidence of aortic valve dysfunction.  She ultimately underwent a flexible sigmoidoscopy on 06/13/2024 with Dr. San. Findings included: -an infiltrative completely obstructing large mass found in the recto-sigmoid colon located 16 cm from anal verge. Biopsies were taken.    IR was then consulted due to rectal bleeding post procedure with plans for pelvic angiogram with possible embolization today.  The patient does have a history of clear cell adenocarcinoma involving the cervix (unclear if cervical or uterine primary but strongly favored cervical), diagnosed 6/21 s/p 3 cycles of cisplatin  with pelvic radiation followed by brachytherapy 07/10/2020-08/14/2020. Per Dr. 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 See prior notes for complete detail.     -03/10/20: Colposcopic exam: TZ not seen entirely, AWE not seen. No lesions seen.  ECC was done, random cervical biopsies were taken Pathology 10:00 - suspicious for adenocarcinoma, 6:00 bx and ECC - adenocarcinoma   -PET scan 03/13/20 shows some low level FDG activity in the cervix (none in uterus), but no prominent cervical mass.     -[facility] biopsy 04/08/20  A. CERVIX, CONIZATION BIOPSY:  - INVASIVE ADENOCARCINOMA WITH INVOLVEMENT OF ENDOCERVICAL AND DEEP MARGINS.  B. ENDOCERVIX, CURETTAGE:  - SCANT DETACHED AND DEGENERATED CELLS, SUSPICIOUS FOR MALIGNANCY.  C. ENDOMETRIUM, CURETTAGE: - INVASIVE ADENOCARCINOMA.    -In view of pathology report and PET scan believe this is most likely a primary cervical clear cell adenocarcinoma and she started chemoradiation 05/11/20.    05/21/20-06/08/20- 3 cycles cisplatin  05/13/20-10/7-  EBRT w/  Dr. Lenn at [facility] 06/21/20 admitted to [facility] for nausea/vomiting/diarrhea due  to colitis from radiation 07/06/20-08/13/20- HDR w/ Dr. Lurline at [facility]  It is noted at her visit on 08/31/2022, symptoms were reported that were concerning for recurrence. A PET scan was ordered but does not appear to have been performed in Care Everywhere review. There were no other GYN ONC visits since that time.   Medical history includes dementia, ESRD on hemodialysis, history of gynecologic cancer (cervical vs uterine with cervical favored), HTN, anemia, hyperlipidemia, TIA, recent c diff colitis.  Interval History:  Patient currently in PACU recovering from IR procedure. Sleeping intermittently. When awake, not responding to questions. Disoriented.   Review of Systems: See HPI/Interval.   Current Meds: Current inpatient and outpatient medications reviewed.  Allergy:  Allergies  Allergen Reactions   Amlodipine  Swelling    Knees down to ankles Knees down to ankles   Hctz [Hydrochlorothiazide]     pancreatitis   Heparin  Other (See Comments)    Hx  HIT,   Pt reports cardiac arrest when given heparin    Lmw Heparin      Reports cardiac arrest when given heparin    Other Other (See Comments)    Reports cardiac arrest when given during surgery Reports cardiac arrest when given during surgery Reports cardiac arrest when given during surgery    Sulfa Antibiotics Rash   Lactose Diarrhea    Per family report   Dairy Aid [Tilactase]     Runny nose   Hydralazine      Nausea and rash    Social Hx:   Social History   Socioeconomic History   Marital status: Married    Spouse name: clarence   Number of children: 2   Years of education: Not on file   Highest education level: Not on file  Occupational History    Comment: works nights  Tobacco Use   Smoking status: Never   Smokeless tobacco: Never  Vaping Use   Vaping status: Never Used  Substance and Sexual Activity   Alcohol use: No   Drug use:  Never   Sexual activity: Not Currently  Other Topics Concern   Not on file  Social History Narrative   Not on file   Social Drivers of Health   Financial Resource Strain: High Risk (04/24/2024)   Received from [facility]   Overall Financial Resource Strain (CARDIA)    Difficulty of Paying Living Expenses: Hard  Food Insecurity: No Food Insecurity (05/16/2024)   Hunger Vital Sign    Worried About Running Out of Food in the Last Year: Never true    Ran Out of Food in the Last Year: Never true  Recent Concern: Food Insecurity - Food Insecurity Present (04/24/2024)   Received from [facility]   Hunger Vital Sign    Within the past 12 months, you worried that your food would run out before you got the money to buy more.: Often true    Within the past 12 months, the food you bought just didn't last and you didn't have money to get more.: Sometimes true  Transportation Needs: No Transportation Needs (05/16/2024)   PRAPARE - Administrator, Civil Service (Medical): No    Lack of Transportation (Non-Medical): No  Recent Concern: Transportation Needs - Unmet Transportation Needs (04/24/2024)   Received from [facility] - Transportation    In the past 12 months, has lack of transportation kept you from medical appointments or from getting medications?: Yes    Lack of  Transportation (Non-Medical): No  Physical Activity: Not on file  Stress: Not on file  Social Connections: Unknown (05/16/2024)   Social Connection and Isolation Panel    Frequency of Communication with Friends and Family: Never    Frequency of Social Gatherings with Friends and Family: Once a week    Attends Religious Services: Never    Database administrator or Organizations: No    Attends Banker Meetings: Never    Marital Status: Patient declined  Catering manager Violence: Not At Risk (05/16/2024)   Humiliation, Afraid, Rape, and Kick  questionnaire    Fear of Current or Ex-Partner: No    Emotionally Abused: No    Physically Abused: No    Sexually Abused: No    Past Surgical Hx:  Past Surgical History:  Procedure Laterality Date   AMPUTATION TOE Left 2013   2nd toe. tip of toe (toe nail was infected)   AV FISTULA PLACEMENT Left 05/11/2018   Procedure: ARTERIOVENOUS (AV) FISTULA CREATION;  Surgeon: Jama Cordella MATSU, MD;  Location: [facility] ORS;  Service: Vascular;  Laterality: Left;   CATARACT EXTRACTION     CERVICAL CONIZATION W/BX N/A 04/08/2020   Procedure: CONIZATION CERVIX WITH BIOPSY;  Surgeon: Mancil Barter, MD;  Location: [facility] ORS;  Service: Gynecology;  Laterality: N/A;   CHOLECYSTECTOMY  2014   COLONOSCOPY     COLONOSCOPY WITH PROPOFOL  N/A 01/10/2018   Procedure: COLONOSCOPY WITH PROPOFOL ;  Surgeon: Toledo, Ladell POUR, MD;  Location: [facility] ENDOSCOPY;  Service: Gastroenterology;  Laterality: N/A;   DIALYSIS/PERMA CATHETER INSERTION N/A 05/21/2018   Procedure: DIALYSIS/PERMA CATHETER INSERTION;  Surgeon: Marea Selinda RAMAN, MD;  Location: [facility] INVASIVE CV LAB;  Service: Cardiovascular;  Laterality: N/A;   DIALYSIS/PERMA CATHETER INSERTION N/A 07/14/2020   Procedure: DIALYSIS/PERMA CATHETER INSERTION;  Surgeon: Marea Selinda RAMAN, MD;  Location: [facility] INVASIVE CV LAB;  Service: Cardiovascular;  Laterality: N/A;   DIALYSIS/PERMA CATHETER REMOVAL N/A 04/11/2019   Procedure: DIALYSIS/PERMA CATHETER REMOVAL;  Surgeon: Marea Selinda RAMAN, MD;  Location: [facility] INVASIVE CV LAB;  Service: Cardiovascular;  Laterality: N/A;   EYE SURGERY Bilateral 2018   cataract extractions   IR IMAGING GUIDED PORT INSERTION  06/05/2020   THROMBECTOMY W/ EMBOLECTOMY  05/11/2018   Procedure: THROMBECTOMY ARTERIOVENOUS FISTULA;  Surgeon: Jama Cordella MATSU, MD;  Location: [facility] ORS;  Service: Vascular;;   TUBAL LIGATION  1984    Past Medical Hx:  Past Medical History:  Diagnosis Date   Anemia 04/2018   low iron. to be started on supplements   Cervical cancer  (HCC)    CKD (chronic kidney disease)    Stage IV   Complication of anesthesia    receceived too much anesthesia, that she was in coma for a couple days    COVID-19 virus detected 10/28/2019   Diabetes mellitus without complication (HCC)    type II   ESRD (end stage renal disease) (HCC)    Heart murmur    followed as a child only   HSIL (high grade squamous intraepithelial lesion) on Pap smear of cervix    Hyperlipidemia associated with type 2 diabetes mellitus (HCC)    Hypertension    Pancreatitis    Peripheral vascular disease     Family Hx:  Family History  Problem Relation Age of Onset   Stroke Mother    Hypertension Mother    Gout Mother    Cancer Father    Diabetes Sister    Diabetes Maternal Grandmother    Diabetes Maternal  Grandfather    Diabetes Paternal Grandmother    Diabetes Paternal Grandfather     Vitals:  Blood pressure (!) 115/50, pulse 64, temperature 97.7 F (36.5 C), resp. rate 19, height 5' 3 (1.6 m), weight 116 lb 2.9 oz (52.7 kg), SpO2 99%.  Physical Exam:  Patient currently laying in bed recovering from IR procedure Grimacing and moaning intermittently. Cachetic.  Disoriented Pelvic exam deferred No lower extrem edema.  Assessment/Plan: 68 year old female currently admitted with large rectal mass noted today on flex sigmoidoscopy with complex hospital course and medical history including hx of clear cell adenocarcinoma gynecologic malignancy (favored cervical over uterine) with treatment at [facility] finished in 08/2020. S/P IR embolization. Colonoscopy performed today with rectal mass biopsy pathology pending. Possibility for the mass to be recurrence of her cervical cancer or new malignancy. Given current performance status, would not be considered a candidate for cancer related treatments from a gyn oncology standpoint. See addition to note from Dr. Viktoria. Dr. Jonel updated and planning on ordering updated labs.   Eleanor JONETTA Epps, NP 06/13/2024,  5:43 PM

## 2024-06-13 NOTE — Progress Notes (Signed)
 Lab called with critical value for Hbg 6.1. Dr. Jonel paged

## 2024-06-13 NOTE — Interval H&P Note (Signed)
 History and Physical Interval Note:  06/13/2024 11:07 AM  Tracy Jennings  has presented today for surgery, with the diagnosis of lower gastrointestinal bleeding, colo-uterine fistula.  The various methods of treatment have been discussed with the patient and family. After consideration of risks, benefits and other options for treatment, the patient has consented to  Procedure(s): SIGMOIDOSCOPY, FLEXIBLE (N/A) as a surgical intervention.  The patient's history has been reviewed, patient examined, no change in status, stable for surgery.  I have reviewed the patient's chart and labs.  Questions were answered to the patient's satisfaction.     Sandor GAILS [name]

## 2024-06-13 NOTE — TOC Progression Note (Addendum)
 Transition of Care [facility]) - Progression Note    Patient Details  Name: Tracy Jennings MRN: [id number] Date of Birth: [birth year]  [facility] CM/SW Contact  Lacresha Fusilier LITTIE Moose, [place] Phone Number: 06/13/2024, 9:29 AM  Clinical Narrative:    Pt appeal for [facility] overturned, effective 9/27-10/1. CSW contacted [facility] and inquired about grace period to admit to SNF. [employer] representative stated the pt has 60 days to admit to SNF from the time the appeal is overturned, which would be August 07, 2024. (This is only for pt who have [employer]). CSW will continue to follow.   Expected Discharge Plan: Skilled Nursing Facility Barriers to Discharge: Insurance Authorization               Expected Discharge Plan and Services       Living arrangements for the past 2 months: Single Family Home                                       Social Drivers of Health (SDOH) Interventions SDOH Screenings   Food Insecurity: No Food Insecurity (05/16/2024)  Recent Concern: Food Insecurity - Food Insecurity Present (04/24/2024)   [facility]  Housing: Low Risk  (05/16/2024)  Transportation Needs: No Transportation Needs (05/16/2024)  Recent Concern: Transportation Needs - Unmet Transportation Needs (04/24/2024)   Received from [facility]  Utilities: Not At Risk (05/16/2024)  Financial Resource Strain: High Risk (04/24/2024)   Received from [facility]  Social Connections: Unknown (05/16/2024)  Tobacco Use: Low Risk  (05/16/2024)    Readmission Risk Interventions     No data to display

## 2024-06-13 NOTE — Progress Notes (Signed)
 I received a chat message from GI requesting a callback.  I called GI back who reported they had just performed flex sig and found a mass on flex sig, 16 cm from the anal verge, that was unable to be traversed and appeared completely obstructing. The mass was biopsied. Patient had large amount of blood per rectum and vaginal bleeding after biopsy.  They are able to control the bleeding per rectum with purastat gel and nexpowder.  They reported that patient had ongoing vaginal bleeding however.  She remained hemodynamically stable.    Gynecology was consulted given findings of mass on flex-sig, colouterine fistula on imaging, vaginal bleeding and patient's history of adenocarcinoma of the cervix s/p 3 cycles of cisplatin  with pelvic radiation followed by brachytherapy 07/10/2020-08/14/2020.  They report they spoke with GYN oncology, Dr. Viktoria, who will be taking over as consult given patient's history and will examine the patient.  GI had consulted interventional radiology for possible embolization. Interventional radiology is planning for pelvic angiogram and possible embolization.  If patient has ongoing bleeding despite IR embolization, she will likely need OR in combination with general surgery and GYN oncology.  I was in contact with my attending during above who agrees with plan. I have communicated events and plan with [facility]. We will follow with you  Tracy Jennings , [facility] Surgery 06/13/2024, 1:48 PM Please see Amion for pager number during day hours 7:00am-4:30pm

## 2024-06-13 NOTE — Progress Notes (Signed)
 Patient was only able to drink 300cc of bowel prep, refused to drink the rest, unable to educate patient d/t dementia. Patient had 5 large loose ,foul smelling bowel movements despite the fact.

## 2024-06-13 NOTE — Progress Notes (Signed)
 Called patient's sister and emergency contact after seeing the patient. No answer, left voicemail letting [name] know I would call back again tomorrow.  Comer Dollar MD Gynecologic Oncology

## 2024-06-13 NOTE — Op Note (Signed)
 [facility] Patient Name: Tracy Jennings Procedure Date : 06/13/2024 MRN: [id number] Attending MD: Sandor Flatter , MD, [id number] Date of Birth: 11-11-1955 CSN: [id number] Age: 68 Admit Type: Inpatient Procedure:                Flexible Sigmoidoscopy Indications:              Hematochezia, Abnormal CT of the GI tract Providers:                Sandor Flatter, MD, Willy Hummer, RN, Haskel Chris, Technician Referring MD:             Rolland BRAVO. Davis Medicines:                Monitored Anesthesia Care Complications:            Hemorrhage, controlled as above Estimated Blood Loss:     Estimated blood loss: 100 mL. Procedure:                Pre-Anesthesia Assessment:                           - Prior to the procedure, a History and Physical                            was performed, and patient medications and                            allergies were reviewed. The patient's tolerance of                            previous anesthesia was also reviewed. The risks                            and benefits of the procedure and the sedation                            options and risks were discussed with the patient.                            All questions were answered, and informed consent                            was obtained. Prior Anticoagulants: The patient has                            taken no anticoagulant or antiplatelet agents. ASA                            Grade Assessment: III - A patient with severe                            systemic disease. After reviewing the risks and  benefits, the patient was deemed in satisfactory                            condition to undergo the procedure.                           After obtaining informed consent, the scope was                            passed under direct vision. The [id number]-[id number] ([id number])                            Olympus endoscope was introduced through the  anus                            and advanced to the the rectosigmoid junction. The                            flexible sigmoidoscopy was technically difficult                            and complex. Scope In: 11:37:16 AM Scope Out: 11:55:37 AM Total Procedure Duration: 0 hours 18 minutes 21 seconds  Findings:      The perianal and digital rectal examinations were normal. There was       stool anteriorly at the vagina.      An infiltrative completely obstructing large mass was found in the       recto-sigmoid colon. This was located approximately 16 cm from the anal       verge. There was mild oozing present upon endoscopic entry. This was       biopsied with a cold forceps for histology. The lesion then started       bleeding precipitously. Initially attempted to treat with Purastat gel,       but continued bleeding. This was then treated with Nexpowder hemostatic       spray with control of bleeding. The endoscope was then reintroduced into       the rectum and no active bleeding noted. 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 Biopsied. hemostatic spray                            applied. Unclear if this is primary GYN malignancy                            invading into the rectal wall. Will follow-up on                            pathology results.                           The patient remained hemodynamically stable  throughout the case.                           After conclusion of the case, it was noted that she                            still had active bleeding from the vagina. I called                            General Surgery, Interventional Radiology, and her                            primary Hospitalist. GYN Oncology was also                            contacted by General Surgery. Plan currently in                            development, but may consider  taking to the IR                            suite for attempt at embolization, and depending on                            results, patient may need to head to the OR for a                            combined General Surgery?"GYN Oncology intervention.                           I updated the patient's sister by phone. Recommendation:           - Patient will remain in the Endoscopy Unit for                            close observation and to allow multiple consulting                            services to evaluate patient and develop                            multidisciplinary plan.                           - Keep NPO.                           - Repeat CBC check. Procedure Code(s):        --- Professional ---                           [phone], 52, Sigmoidoscopy, flexible; with control of                            bleeding, any method Diagnosis  Code(s):        --- Professional ---                           D49.0, Neoplasm of unspecified behavior of                            digestive system                           K56.691, Other complete intestinal obstruction                           K92.1, Melena (includes Hematochezia)                           R93.3, Abnormal findings on diagnostic imaging of                            other parts of digestive tract CPT copyright 2022 American Medical Association. All rights reserved. The codes documented in this report are preliminary and upon coder review may  be revised to meet current compliance requirements. Sandor Flatter, MD 06/13/2024 12:51:49 PM Number of Addenda: 0

## 2024-06-13 NOTE — Transfer of Care (Signed)
 Immediate Anesthesia Transfer of Care Note  Patient: Tracy Jennings  Procedure(s) Performed: SIGMOIDOSCOPY, FLEXIBLE IR ANGIOGRAM PELVIS SELECTIVE OR SUPRASELECTIVE IR EMBO ART  VEN HEMORR LYMPH EXTRAV  INC GUIDE ROADMAPPING IR US  GUIDE VASC ACCESS RIGHT IR ABD/PEL BILAT ART BRANCH 3RD ORDER IR ANGIOGRAM VISCERAL SELECTIVE  Patient Location: PACU  Anesthesia Type:General  Level of Consciousness: drowsy  Airway & Oxygen Therapy: Patient Spontanous Breathing and Patient connected to face mask oxygen  Post-op Assessment: Report given to RN and Post -op Vital signs reviewed and stable  Post vital signs: Reviewed and stable  Last Vitals:  Vitals Value Taken Time  BP 106/67 06/13/24 16:45  Temp    Pulse 65 06/13/24 16:45  Resp 22 06/13/24 16:45  SpO2 100 % 06/13/24 16:45  Vitals shown include unfiled device data.  Last Pain:  Vitals:   06/13/24 1100  TempSrc: Temporal  PainSc: 0-No pain      Patients Stated Pain Goal: 2 (05/22/24 2045)  Complications: No notable events documented.

## 2024-06-14 DIAGNOSIS — N186 End stage renal disease: Secondary | ICD-10-CM | POA: Diagnosis not present

## 2024-06-14 DIAGNOSIS — C539 Malignant neoplasm of cervix uteri, unspecified: Secondary | ICD-10-CM | POA: Diagnosis not present

## 2024-06-14 DIAGNOSIS — D649 Anemia, unspecified: Secondary | ICD-10-CM | POA: Diagnosis not present

## 2024-06-14 DIAGNOSIS — N939 Abnormal uterine and vaginal bleeding, unspecified: Secondary | ICD-10-CM

## 2024-06-14 DIAGNOSIS — F03918 Unspecified dementia, unspecified severity, with other behavioral disturbance: Secondary | ICD-10-CM | POA: Diagnosis not present

## 2024-06-14 DIAGNOSIS — A0472 Enterocolitis due to Clostridium difficile, not specified as recurrent: Secondary | ICD-10-CM | POA: Diagnosis not present

## 2024-06-14 DIAGNOSIS — N824 Other female intestinal-genital tract fistulae: Secondary | ICD-10-CM | POA: Diagnosis not present

## 2024-06-14 LAB — TYPE AND SCREEN
Unit division: 0
Unit division: 0
Unit division: 0
Unit division: 0

## 2024-06-14 LAB — POCT I-STAT EG7
Acid-Base Excess: 3 mmol/L — ABNORMAL HIGH (ref 0.0–2.0)
Bicarbonate: 28.5 mmol/L — ABNORMAL HIGH (ref 20.0–28.0)
Calcium, Ion: 1.17 mmol/L (ref 1.15–1.40)
HCT: 25 % — ABNORMAL LOW (ref 36.0–46.0)
Hemoglobin: 8.5 g/dL — ABNORMAL LOW (ref 12.0–15.0)
O2 Saturation: 79 %
Potassium: 4.7 mmol/L (ref 3.5–5.1)
Sodium: 137 mmol/L (ref 135–145)
TCO2: 30 mmol/L (ref 22–32)
pCO2, Ven: 47.7 mmHg (ref 44–60)
pH, Ven: 7.384 (ref 7.25–7.43)
pO2, Ven: 44 mmHg (ref 32–45)

## 2024-06-14 LAB — COMPREHENSIVE METABOLIC PANEL WITH GFR
ALT: 9 U/L (ref 0–44)
AST: 12 U/L — ABNORMAL LOW (ref 15–41)
Albumin: 2.2 g/dL — ABNORMAL LOW (ref 3.5–5.0)
Alkaline Phosphatase: 43 U/L (ref 38–126)
Anion gap: 15 (ref 5–15)
BUN: 49 mg/dL — ABNORMAL HIGH (ref 8–23)
CO2: 21 mmol/L — ABNORMAL LOW (ref 22–32)
Calcium: 8.5 mg/dL — ABNORMAL LOW (ref 8.9–10.3)
Chloride: 100 mmol/L (ref 98–111)
Creatinine, Ser: 5.29 mg/dL — ABNORMAL HIGH (ref 0.44–1.00)
GFR, Estimated: 8 mL/min — ABNORMAL LOW (ref >60)
Glucose, Bld: 111 mg/dL — ABNORMAL HIGH (ref 70–99)
Potassium: 5.2 mmol/L — ABNORMAL HIGH (ref 3.5–5.1)
Sodium: 136 mmol/L (ref 135–145)
Total Bilirubin: 1.3 mg/dL — ABNORMAL HIGH (ref 0.0–1.2)
Total Protein: 5.7 g/dL — ABNORMAL LOW (ref 6.5–8.1)

## 2024-06-14 LAB — BPAM RBC
Blood Product Expiration Date: 202510312359
Blood Product Expiration Date: 202510212359
Blood Product Unit Number: 202510312359
Blood Product Unit Number: 202510312359
ISSUE DATE / TIME: 202510010114
ISSUE DATE / TIME: 202510010518
ISSUE DATE / TIME: 202510222359
ISSUING PHYSICIAN: 202510010114
PRODUCT CODE: 202510021855
PRODUCT CODE: 202510312359
Unit Type and Rh: 6200
Unit Type and Rh: 202510022238
Unit Type and Rh: 202510022238
Unit Type and Rh: 202510222359
Unit Type and Rh: 6200
Unit Type and Rh: 6200
Unit Type and Rh: 6200

## 2024-06-14 LAB — GLUCOSE, CAPILLARY
Glucose-Capillary: 111 mg/dL — ABNORMAL HIGH (ref 70–99)
Glucose-Capillary: 111 mg/dL — ABNORMAL HIGH (ref 70–99)
Glucose-Capillary: 78 mg/dL (ref 70–99)
Glucose-Capillary: 80 mg/dL (ref 70–99)
Glucose-Capillary: 79 mg/dL (ref 70–99)
Glucose-Capillary: 79 mg/dL (ref 70–99)

## 2024-06-14 LAB — HEMOGLOBIN AND HEMATOCRIT, BLOOD
HCT: 30.1 % — ABNORMAL LOW (ref 36.0–46.0)
HCT: 31.9 % — ABNORMAL LOW (ref 36.0–46.0)
Hemoglobin: 10.2 g/dL — ABNORMAL LOW (ref 12.0–15.0)
Hemoglobin: 10.7 g/dL — ABNORMAL LOW (ref 12.0–15.0)

## 2024-06-14 MED ORDER — FENTANYL CITRATE PF 50 MCG/ML IJ SOSY
12.5000 ug | PREFILLED_SYRINGE | INTRAMUSCULAR | Status: DC | PRN
  Administered 2024-06-14 – 2024-06-16 (×13): 12.5 ug via INTRAVENOUS
  Filled 2024-06-14 (×15): qty 1

## 2024-06-14 MED ORDER — LABETALOL HCL 5 MG/ML IV SOLN
10.0000 mg | Freq: Four times a day (QID) | INTRAVENOUS | Status: DC | PRN
  Administered 2024-06-15: 10 mg via INTRAVENOUS
  Filled 2024-06-14 (×2): qty 4

## 2024-06-14 MED ORDER — HYDRALAZINE HCL 20 MG/ML IJ SOLN
10.0000 mg | INTRAMUSCULAR | Status: DC | PRN
  Administered 2024-06-14 – 2024-06-19 (×7): 10 mg via INTRAVENOUS
  Filled 2024-06-14 (×8): qty 1

## 2024-06-14 MED ORDER — FENTANYL CITRATE PF 50 MCG/ML IJ SOSY
25.0000 ug | PREFILLED_SYRINGE | Freq: Once | INTRAMUSCULAR | Status: AC
  Administered 2024-06-15: 25 ug via INTRAVENOUS
  Filled 2024-06-14: qty 1

## 2024-06-14 NOTE — TOC Progression Note (Signed)
 Transition of Care [facility]) - Progression Note    Patient Details  Name: Tracy Jennings MRN: [id number] Date of Birth: Sep 27, 1955  Transition of [facility] CM/SW Contact  Montie LOISE Louder, [place] Phone Number: 06/14/2024, 4:43 PM  Clinical Narrative:     TOC will continue to follow and assist with discharge planning.  Montie Louder, MSW, LCSW Clinical Social Worker    Expected Discharge Plan: Skilled Nursing Facility Barriers to Discharge: Insurance Authorization               Expected Discharge Plan and Services       Living arrangements for the past 2 months: Single Family Home                                       Social Drivers of Health (SDOH) Interventions SDOH Screenings   Food Insecurity: No Food Insecurity (05/16/2024)  Recent Concern: Food Insecurity - Food Insecurity Present (04/24/2024)   Received from [facility]  Housing: Low Risk  (05/16/2024)  Transportation Needs: No Transportation Needs (05/16/2024)  Recent Concern: Transportation Needs - Unmet Transportation Needs (04/24/2024)   Received from [facility]  Utilities: Not At Risk (05/16/2024)  Financial Resource Strain: High Risk (04/24/2024)   Received from [facility]  Social Connections: Unknown (05/16/2024)  Tobacco Use: Low Risk  (06/13/2024)    Readmission Risk Interventions     No data to display

## 2024-06-14 NOTE — Progress Notes (Addendum)
 Progress Note   Patient: Tracy Jennings FMW:[id number] DOB: [birth year] DOA: 05/16/2024     29 DOS: the patient was seen and examined on 06/14/2024 at 7:35AM      Brief hospital course: 68 y.o. 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  The initial plan after general surgery evaluation was discharged with outpatient follow-up for surgical resection of fistula, but it became clear that the patient's functional status had declined so much that she was too weak to sit up in a chair for dialysis, and her hospitalization was prolonged.  She subsequently then developed bleeding from her colo-uterine fistula, and GI were consulted.  On flexible sigmoidoscopy, she was found to have a large malignant appearing mass at the site of the fistula, and this developed bleeding post sigmoidoscopy requiring embolization.     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 Bleeding slowed overnight, still oozing, but hemodynamics normal, patient appears comfortable and only changed pad 3x overnight. - Hold anticoagulation for now - Follow biopsy   Acute blood loss anemia Baseline 9-10.  Transfused 2u PRBC 10/1 and another 2u post-embo on 10/2 Hgb up to 10 today.  Oozing has slowed - Trend hemoglobin   History of cervical clear cell adenoCA Masslike consolidation of the left lung Pelvic lymphadenopathy As noted previously, the patient underwent chemotherapy and radiation as well as brachytherapy in 2021 for her unusual cervical cancer.  In 2023, GYN oncology notes imply that she had some thickening of the colon and pelvic lymphadenopathy and a PET scan and follow-up imaging was planned, but does not appear to ever been completed and I see no follow-up since then.  Although biopsy is pending, the  appearance of the mass in the colon is strongly suspected to be malignant by GYN oncology and gastroenterology.  - Have discussed with [facility], family meeting is pending   Acute metabolic encephalopathy Due to underlying dementia, systemic illness. - Standard delirium precautions: blinds open and lights on during day, TV off, minimize interruptions at night, glasses/hearing aids, PT/OT, avoiding Beers list medications   - SLP consult - Meds to IV, advance as mentation improves - Hold iron, vit B12, famotidine , prosource/Boost until mentation improves   Right internal jugular DVT - Risk of anticoagulation outweighs benefit at this time - Monitor for resumption pending goals of care  ESRD - HD per Nephrology  Pleural effusion IR previously consulted, not enough fluid to tap.  Hypertension BP elevated again - Continue Coreg , diltiazem , losartan  - Hold clonidine , Imdur  for now  Recent C. difficile Resolved   Severe protein calorie malnutrition Failure to thrive Recently failed trial of tube feeds due to worsening of diarrhea   Diabetes Glucose controlled - Continue sliding scale corrections         Subjective: Sluggish.  Had embo yesterday.  Ovrernight, oozing slowed, only 3x pad change.  This morning, improving still.  No fever. No focal weakness.     Physical Exam: BP (!) 149/67   Pulse (!) 13   Temp 97.7 F (36.5 C)   Resp 12   Ht 5' 3 (1.6 m)   Wt 53.2 kg   LMP  (LMP Unknown)   SpO2 99%   BMI 20.78 kg/m   Frail elderly female, opens eyes, says name, doesn't answer any other questions RRR no murmurs, no  LE edema Respiraotry rate normal, lungs diminished due to low inspiratory effort but clear Abdomen seems soft, no grimace to palpation Resists movement of both arms equally but does not follow commands well.  Opens eyes, makes eye contact, then closes them.  Data Reviewed: BMP shows mild hyperkalemia, elevated creatinine CBC shows Hgb up  to 10 Discussed with Nephrology     Family Communication:     Disposition: Status is: Inpatient         Author: [name], MD 06/14/2024 11:24 AM  For on call review [email or web address].

## 2024-06-14 NOTE — Progress Notes (Signed)
 Patient received 2 Units of PRBC and tolerated well. Hgb improved to 10.1. Patient continues to have moderate bright red vaginal bleeding.

## 2024-06-14 NOTE — Progress Notes (Signed)
 [facility] Progress Note   Subjective:    Seen in [facility]. She is asleep on dialysis and does not respond to questions.  Yesterday's events noted -- Found to have new rectal/vaginal bleeding from colouterine fistula -large mass found on colonoscopy then IR embolization for continued bleeding. Transfused 2 u prbcs after procedures Per RN still bleeding some this am.   Objective Vitals:   06/14/24 0309 06/14/24 0845 06/14/24 0856 06/14/24 0900  BP: (!) 173/79 (!) 151/59  (!) 151/64  Pulse: 80 73  73  Resp: 10 14 12 13   Temp: 98.2 F (36.8 C) 98.2 F (36.8 C)  97.7 F (36.5 C)  TempSrc: Axillary Axillary    SpO2: 100% 99%  97%  Weight:    53.2 kg  Height:       Physical Exam General: Frail woman, NAD. Room air. + eyelid edema Heart: RRR Lungs: CTAB Extremities: No LE edema Dialysis Access: L AVG  Additional Objective Labs: Basic Metabolic Panel: Recent Labs  Lab 06/11/24 0028 06/13/24 0355  NA 139 136  K 4.7 4.9  CL 100 101  CO2 27 25  GLUCOSE 98 109*  BUN 46* 41*  CREATININE 5.43* 4.76*  CALCIUM  8.2* 8.4*  PHOS 3.9  --    Liver Function Tests: Recent Labs  Lab 06/11/24 0028 06/13/24 0355  AST  --  13*  ALT  --  10  ALKPHOS  --  51  BILITOT  --  0.5  PROT  --  5.8*  ALBUMIN 1.7* 1.8*   CBC: Recent Labs  Lab 06/11/24 0028 06/11/24 2150 06/12/24 1544 06/13/24 0355 06/13/24 1811 06/14/24 0500  WBC 14.3* 16.9*  --  17.2* 21.9*  --   NEUTROABS 11.9* 14.2*  --   --   --   --   HGB 7.1* 6.8*   < > 9.5* 6.1* 10.2*  HCT 23.5* 22.2*   < > 30.0* 19.6* 30.1*  MCV 96.7 96.1  --  92.6 95.1  --   PLT 249 234  --  219 163  --    < > = values in this interval not displayed.   Medications:  anticoagulant sodium citrate       (feeding supplement) PROSource Plus  30 mL Oral BID BM   B-complex with vitamin C  1 tablet Oral Daily   carvedilol   25 mg Oral BID WC   Chlorhexidine  Gluconate Cloth  6 each Topical Q0600   cloNIDine   0.3 mg Transdermal  Weekly   darbepoetin (ARANESP ) injection - DIALYSIS  100 mcg Subcutaneous Q Mon-1800   diltiazem   360 mg Oral Daily   famotidine   20 mg Oral Daily   ferrous sulfate   325 mg Oral Daily   Gerhardt's butt cream   Topical QID   insulin  aspart  0-5 Units Subcutaneous QHS   insulin  aspart  0-9 Units Subcutaneous TID WC   isosorbide  mononitrate  30 mg Oral Daily   lactose free nutrition  237 mL Oral TID WC   losartan   50 mg Oral Daily   multivitamin  1 tablet Oral QHS   sodium chloride  flush  10-40 mL Intracatheter Q12H   thiamine   100 mg Oral Daily    Dialysis Orders MWF [facility]  3.5hrs, 400/500, EDW 53kg, 2K/2.5Ca bath, AVG, heparin  6000 unit bolus + 400u/hr pump - Calcitriol  1.70mcg with HD - Sensipar 60mg  with HD - Micera 75mcg Q 4 weeks - per records, dose just ordered (not given) - Binder: Calcium  Acetate  TID with meals   Assessment/Plan: Colo-uterine fistula: per abd CT. Initial plans for outpt management; Surgery/GI consulted 9/30 for new onset rectal/vaginal bleeding from fistula. Subsequently found to have large rectal mass suggesting malignancy; Required IR embolization for continued bleeding. Surgery also following, C-diff colitis: s/p course of po vancomycin .  ESRD: on HD MWF. Off schedule this week. Had HD Tues. HD today -on schedule  HTN/volume:  BP remains elevated. Continue UF with HD as able.  Anemia of ESRD + ABLA.  Hgb 10.2   s/p 2 u prbcs.  Continue Aranesp  q Monday this admit. 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 Dementia Malnutrition: NG tube placed, TF started 9/10 - stopped 9/14 (exacerbated diarrhea), NG tube now out.  GOC: Palliative care is following and family has been wanting full scope of care. Technically is tolerating HD but doubt contributing to QOL. With new finding of malignancy need to reassess further GOC.    Tracy Ronnald Acosta PA-C [facility] 06/14/2024,9:10 AM

## 2024-06-14 NOTE — Progress Notes (Signed)
   06/14/24 1352  Vitals  Temp 98.7 F (37.1 C)  Temp Source Oral  BP (!) 179/60  MAP (mmHg) 94  BP Location Right Arm  BP Method Automatic  Patient Position (if appropriate) Lying  Pulse Rate 79  Pulse Rate Source Monitor  ECG Heart Rate 78  Resp 12  Level of Consciousness  Level of Consciousness Alert  MEWS COLOR  MEWS Score Color Green  Oxygen Therapy  SpO2 99 %  O2 Device Room Air  Pain Assessment  Pain Scale PAINAD  PAINAD (Pain Assessment in Advanced Dementia)  Breathing 0  Negative Vocalization 0  Facial Expression 0  Body Language 0  Consolability 0  PAINAD Score 0  MEWS Score  MEWS Temp 0  MEWS Systolic 0  MEWS Pulse 0  MEWS RR 1  MEWS LOC 0  MEWS Score 1   1352- Pt return to unit from Dialysis. Pt A&Ox1.  1404- RN attempted to administer PO medications to pt. Pt able to swallow one spoonful of crushed medications in applesauce. Pt refused to finish and swallow all PO medications in applesauce. Pt's mouth suctioned, and remainder of applesauce removed. 1415- Dr. Jonel notified of pt's refusal to swallow PO medications.  1500- Dr. Jonel at bedside. 1530- New orders receieved.

## 2024-06-14 NOTE — Progress Notes (Signed)
 1 Day Post-Op  Subjective: S/p embolization yesterday by IR for rectal and vaginal bleeding.  Mass noted on colonoscopy by GI at site of fistula.  Patient in HD right now.  Will not move to help with my exam and won't even speak to me.  HD tech informed me she was late to HD due to being cleaned up as she was still bleeding earlier.  Objective: Vital signs in last 24 hours: Temp:  [97.4 F (36.3 C)-98.5 F (36.9 C)] 97.7 F (36.5 C) (10/03 0900) Pulse Rate:  [61-80] 74 (10/03 1000) Resp:  [0-32] 15 (10/03 1000) BP: (81-173)/(37-79) 141/66 (10/03 1000) SpO2:  [93 %-100 %] 100 % (10/03 1000) Weight:  [53.2 kg] 53.2 kg (10/03 0900) Last BM Date : 06/13/24  Intake/Output from previous day: 10/02 0701 - 10/03 0700 In: 2206.8 [I.V.:750; Blood:1206.8; IV Piggyback:250] Out: 250 [Blood:250] Intake/Output this shift: No intake/output data recorded.  PE: Gen:  NAD Abd: Soft, ND, NT.  As best as I can see as patient will not move or cooperate with my exam, she appears to have blood on her gown c/w persistent oozing at least.  Lab Results:  Recent Labs    06/13/24 0355 06/13/24 1811 06/14/24 0500  WBC 17.2* 21.9*  --   HGB 9.5* 6.1* 10.2*  HCT 30.0* 19.6* 30.1*  PLT 219 163  --    BMET Recent Labs    06/13/24 0355 06/14/24 0922  NA 136 136  K 4.9 5.2*  CL 101 100  CO2 25 21*  GLUCOSE 109* 111*  BUN 41* 49*  CREATININE 4.76* 5.29*  CALCIUM  8.4* 8.5*   PT/INR No results for input(s): LABPROT, INR in the last 72 hours. CMP     Component Value Date/Time   NA 136 06/14/2024 0922   NA 139 06/14/2014 0543   K 5.2 (H) 06/14/2024 0922   K 3.6 06/14/2014 0543   CL 100 06/14/2024 0922   CL 105 06/14/2014 0543   CO2 21 (L) 06/14/2024 0922   CO2 29 06/14/2014 0543   GLUCOSE 111 (H) 06/14/2024 0922   GLUCOSE 152 (H) 06/14/2014 0543   BUN 49 (H) 06/14/2024 0922   BUN 21 (H) 06/14/2014 0543   CREATININE 5.29 (H) 06/14/2024 0922   CREATININE 1.04 06/14/2014 0543    CALCIUM  8.5 (L) 06/14/2024 0922   CALCIUM  8.4 (L) 06/14/2014 0543   PROT 5.7 (L) 06/14/2024 0922   PROT 8.0 06/12/2014 2134   ALBUMIN 2.2 (L) 06/14/2024 0922   ALBUMIN 2.9 (L) 06/12/2014 2134   AST 12 (L) 06/14/2024 0922   AST 66 (H) 06/12/2014 2134   ALT 9 06/14/2024 0922   ALT 68 (H) 06/12/2014 2134   ALKPHOS 43 06/14/2024 0922   ALKPHOS 110 06/12/2014 2134   BILITOT 1.3 (H) 06/14/2024 0922   BILITOT 0.4 06/12/2014 2134   GFRNONAA 8 (L) 06/14/2024 0922   GFRNONAA 58 (L) 06/14/2014 0543   GFRNONAA >60 12/10/2013 0407   GFRAA 3 (L) 06/15/2020 0841   GFRAA >60 06/14/2014 0543   GFRAA >60 12/10/2013 0407   Lipase     Component Value Date/Time   LIPASE 22 05/16/2024 1308   LIPASE 535 (H) 12/11/2013 0405    Studies/Results: No results found.   Anti-infectives: Anti-infectives (From admission, onward)    Start     Dose/Rate Route Frequency Ordered Stop   06/04/24 1215  vancomycin  (VANCOCIN ) capsule 125 mg        125 mg Oral Daily 06/04/24 1116  06/05/24 0621   05/27/24 1000  vancomycin  (VANCOCIN ) capsule 125 mg        125 mg Oral Daily 05/21/24 1019 06/04/24 0959   05/20/24 1530  amoxicillin -clavulanate (AUGMENTIN ) 500-125 MG per tablet 1 tablet        1 tablet Oral Every 12 hours 05/20/24 1519 05/29/24 2152   05/17/24 0100  fidaxomicin  (DIFICID ) tablet 200 mg        200 mg Oral 2 times daily 05/17/24 0003 05/26/24 1037   05/17/24 0100  metroNIDAZOLE  (FLAGYL ) IVPB 500 mg  Status:  Discontinued        500 mg 100 mL/hr over 60 Minutes Intravenous Every 12 hours 05/17/24 0003 05/20/24 1519        Assessment/Plan 68 yo female with C difficile colitis admitted last month and tx w/ Vanc and Dificid  as well as imaging evidence of a colouterine fistula and now with near obstructing infiltrative mass 16cm from anal verge, unclear if this is GYN related or new malignancy, GI bleed - Hold anticoagulation.  - IR embo yesterday and hgb 10 from 6 today; however, as best as I can  tell patient seems to be still be bleeding or oozing some.  Her vitals appear stable so doubt she is briskly bleeding -biopsies pending. -patient does not interact with me today or participate in her care with me.  I agree with Dr. Viktoria that surgical intervention would be high risk and morbid for this patient with numerous co-morbidities. -agree with continued palliative involvement as when I asked the patient to talk to me so we can figure out the best plan of care for her, she continued to not speak to me and not participate. -no immediate surgical plans at this time, but will continue to follow and monitor given bleeding and obstructive nature.  FEN - renal diet VTE - SCDs, hold Eliquis  for anemia  ID - Seen by ID 9/30. No current abx.   - Per TRH -  Hx C. Diff - Per GI and ID. Imodium. No current abx.  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    LOS: 29 days    Burnard FORBES Banter, [facility] 06/14/2024, 10:23 AM Please see Amion for pager number during day hours 7:00am-4:30pm

## 2024-06-14 NOTE — Progress Notes (Signed)
 PT Cancellation Note  Patient Details Name: Tracy Jennings MRN: [id number] DOB: 1956/09/01   Cancelled Treatment:    Reason Eval/Treat Not Completed: (P) Patient at procedure or test/unavailable. Pt at HD. Will plan to follow-up as able.   Theo Ferretti, PT, DPT Acute Rehabilitation Services  Office: [phone]    Theo CHRISTELLA Ferretti 06/14/2024, 9:08 AM

## 2024-06-14 NOTE — Progress Notes (Signed)
 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 This was treated with definitive chemoRT completed in 08/2020. She was followed until 2023, at which time PET was recommended given concern for symptoms indicating a recurrence. The patient was then lost to follow-up.   Currently admitted at [facility] since 05/16/2024. On 9/30, the patient developed vaginal and rectal bleeding. No obvious active bleeding was noted on CT angiography. Yesterday she under went flex sigmoidoscopy with an infiltrative/obstructing mass 16 cm proximal to the anal verge (biopsies taken). Given quantity of post-procedure bleeding, the patient was urgently taken for IR evaluation with embolization of the anterior branch of the left internal iliac artery.  Subjective: Patient is sleeping. Will open eyes intermittently. Giving one word answers randomly. Not oriented.   Objective:    06/14/2024    5:00 PM 06/14/2024    4:10 PM 06/14/2024    1:52 PM  Vitals with BMI  Systolic 178 176 820  Diastolic 81 62 60  Pulse 76 78 79    Resting in bed comfortably. Sleeping and opening eyes intermittently. Not interactive. Orange size amount of dark blood noted on bed pad.    A. RECTUM, BIOPSY:  -  Predominantly fibrinopurulent debris and focal fragments of crushed granulation tissue with dystrophic calcifications.      Latest Ref Rng & Units 06/14/2024    2:56 PM 06/14/2024    5:00 AM 06/13/2024    6:11 PM  CBC  WBC 4.0 - 10.5 K/uL   21.9   Hemoglobin 12.0 - 15.0 g/dL 89.2  89.7  6.1   Hematocrit 36.0 - 46.0 % 31.9  30.1  19.6   Platelets 150 - 400 K/uL   163        Latest Ref Rng & Units 06/14/2024    9:22 AM 06/13/2024    3:55 AM 06/11/2024   12:28 AM  BMP  Glucose 70 - 99 mg/dL 888  890  98   BUN 8 - 23 mg/dL 49  41  46   Creatinine 0.44 - 1.00 mg/dL  4.70  5.23  4.56   Sodium 135 - 145 mmol/L 136  136  139   Potassium 3.5 - 5.1 mmol/L 5.2  4.9  4.7   Chloride 98 - 111 mmol/L 100  101  100   CO2 22 - 32 mmol/L 21  25  27    Calcium  8.9 - 10.3 mg/dL 8.5  8.4  8.2      Assessment/Plan: 68 year old female currently admitted with hx of cervical cancer with large rectal mass and abnormal bleeding. Rectal biopsy from flex sigmoidscopy from yesterday non-diagnostic. Given performance status, not a candidate for cancer related care. Dr. Viktoria called and spoke with sister. See addition to note.

## 2024-06-14 NOTE — Progress Notes (Signed)
 [facility] ROUNDING NOTE   Subjective: Flexible sigmoidoscopy completed yesterday and notable for obstructing, inflammatory lesion in the rectum with precipitous bleeding which was managed with Nexpowder.  While there was no bleeding in the rectum, she continued to have vaginal bleeding and was taken to the IR suite with embolization of the anterior branch of the left internal iliac artery.  Transfused 3 units with good serologic response with H/H 10.2/30 today.  Patient seen by the GYN Oncology service and General Surgery service as well.   Objective: Vital signs in last 24 hours: Temp:  [97.4 F (36.3 C)-98.7 F (37.1 C)] 98.7 F (37.1 C) (10/03 1352) Pulse Rate:  [13-80] 79 (10/03 1352) Resp:  [0-24] 12 (10/03 1352) BP: (81-179)/(37-79) 179/60 (10/03 1352) SpO2:  [93 %-100 %] 99 % (10/03 1352) Weight:  [52.2 kg-53.2 kg] 52.2 kg (10/03 1224) Last BM Date : 06/13/24 General: NAD Abdomen:  Soft, NT, ND.  No rectal blood, but still blood from vagina Ext:  No c/c/e    Intake/Output from previous day: 10/02 0701 - 10/03 0700 In: 2206.8 [I.V.:750; Blood:1206.8; IV Piggyback:250] Out: 250 [Blood:250] Intake/Output this shift: Total I/O In: -  Out: 1000 [Other:1000]   Lab Results: Recent Labs    06/11/24 2150 06/12/24 1544 06/13/24 0355 06/13/24 1811 06/14/24 0500  WBC 16.9*  --  17.2* 21.9*  --   HGB 6.8*   < > 9.5* 6.1* 10.2*  PLT 234  --  219 163  --   MCV 96.1  --  92.6 95.1  --    < > = values in this interval not displayed.   BMET Recent Labs    06/13/24 0355 06/14/24 0922  NA 136 136  K 4.9 5.2*  CL 101 100  CO2 25 21*  GLUCOSE 109* 111*  BUN 41* 49*  CREATININE 4.76* 5.29*  CALCIUM  8.4* 8.5*   LFT Recent Labs    06/13/24 0355 06/14/24 0922  PROT 5.8* 5.7*  ALBUMIN 1.8* 2.2*  AST 13* 12*  ALT 10 9  ALKPHOS 51 43  BILITOT 0.5 1.3*   PT/INR No results for input(s): INR in the last 72 hours.    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 During colonoscopy biopsy, patient began bleeding excessively. IR requested for urgent arterial embolization for active exsanguination. EXAM: Angiogram with embolization ANESTHESIA/SEDATION: General anesthesia CONTRAST:  100 mL Omnipaque  300 FLUOROSCOPY: Radiation Exposure Index (as provided by the fluoroscopic device): 495 mGy Kerma COMPLICATIONS: None immediate. PROCEDURE: Informed consent was obtained from the patient following explanation of the procedure, risks, benefits and alternatives. The patient understands, agrees and consents for the procedure. All questions were addressed. A time out was performed prior to the initiation of the procedure. Maximal barrier sterile technique utilized including caps, mask, sterile gowns, sterile gloves, large sterile drape, hand hygiene, and Betadine prep. In a supine position, the right groin was prepped and draped in the usual sterile fashion. Ultrasound was used to evaluate the right common femoral artery which was pulsatile and anechoic indicating patency. A needle was advanced with ultrasound guidance from the skin incision to the midpoint of the right common femoral artery and a final image was obtained and stored in patient's permanent medical record. Access was then exchanged over an 018 guidewire for a micropuncture sheath. Access was then exchanged over an 035 wire which was advanced under fluoroscopic guidance for a  6 French sheath. An Omni flush catheter was advanced over the guidewire and reformed in the distal abdominal aorta. From this location a pelvic angiogram was performed. No active extravasation identified. 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 No significant identification of  anterior division from the internal iliac artery on the right. Large anterior division from the left internal iliac artery. Using standard catheter and guidewire technique, the Omni flush catheter was used to selectively cannulate the left common iliac artery. Guidewire and catheter were then advanced to the mid femoral artery on the left in order to advance the catheter and subsequently a 35 cm sheath into the common iliac artery on the left side. Selective cannulation of the left internal iliac artery was performed. An angiogram of the left internal iliac artery was then performed from the glide catheter which was placed into the proximal portion of this artery. Again no active extravasation identified, however numerous small vessels seen scattered throughout the pelvis from the anterior division. The anterior division of the internal iliac artery on the left side was then cannulated using standard catheter and guidewire technique with the glide catheter and a Glidewire. Once the catheter was in position within the distal anterior division a repeat angiogram was performed with only 1 region suggestive of active bleeding and the rest with angiogenesis from pelvic radiation. The catheter was then flushed and Gel-Foam slurry was made at the table and slowly injected through the catheter under fluoroscopic guidance for adequacy of Gel-Foam embolization. After embolization, Gel-Foam was then cleared from the catheter with the guidewire and then slow gentle flushing. Final image demonstrates slow injection of contrast and a static contrast column indicating satisfactory Gel-Foam embolization of the entire anterior division. The sheath was then partially withdrawn to the right common iliac artery and the guidewire was reversed in order to create a Waltman loop and advanced the glide catheter into the abdominal aorta. The guidewire was again reversed into its normal position and the catheter was used to selectively  cannulate the right internal iliac artery. With the glide catheter tip in the right internal iliac artery, a selective angiogram of this vessel was performed. No significant anterior division identified. The posterior division does demonstrate a few arterial branches filling the hemipelvis. Multiple attempts using coaxial technique was attempted to fill the small vessels in the right hemipelvis potentially contributing to the colonic hemorrhage without success. Using standard catheter and guidewire technique, catheter and guidewire were advanced into the distal abdominal aorta. And aortic angiogram was then performed in order to locate the inferior mesenteric artery and also scout for potential medial sacral artery. The IMA was cannulated using a Sim catheter and Glidewire. A dedicated inferior mesenteric angiogram was then performed. No identification of any active extravasation. No significant filling of the rectal arteries. Coaxial technique was used to advance the microcatheter and microwire into the superior rectal artery and a dedicated superior rectal artery angiogram was performed. This angiogram was performed separately secondary to abrupt truncation of this vessel during the initial IMA angiogram. With the microcatheter in the superior rectal artery, no significant filling of the rectum identified. Sheath injection of the right common femoral artery was performed in order to evaluate access site prior to deployment of the Angio-Seal device. Angio-Seal was used for closure without complication. IMPRESSION: 1. Successful Gel-Foam embolization of the anterior division of the left internal iliac artery. No active extravasation identified, however  embolization of the pelvic arterial anatomy from this location seem justified based on CT findings and angiogram in order to decrease the likelihood of active arterial bleed. 2. Attempted access to the right internal iliac artery anterior division was unsuccessful. No  active extravasation and overall decreased arterial filling compared to the left. 3. IMA and superior rectal artery angiogram fail to demonstrate any active extravasation. 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 During colonoscopy biopsy, patient began bleeding excessively. IR requested for urgent arterial embolization for active exsanguination. EXAM: Angiogram with embolization ANESTHESIA/SEDATION: General anesthesia CONTRAST:  100 mL Omnipaque  300 FLUOROSCOPY: Radiation Exposure Index (as provided by the fluoroscopic device): 495 mGy Kerma COMPLICATIONS: None immediate. PROCEDURE: Informed consent was obtained from the patient following explanation of the procedure, risks, benefits and alternatives. The patient understands, agrees and consents for the procedure. All questions were addressed. A time out was performed prior to the initiation of the procedure. Maximal barrier sterile technique utilized including caps, mask, sterile gowns, sterile gloves, large sterile drape, hand hygiene, and Betadine prep. In a supine position, the right groin was prepped and draped in the usual sterile fashion. Ultrasound was used to evaluate the right common femoral artery which was pulsatile and anechoic indicating patency. A needle was advanced with ultrasound guidance from the skin incision to the midpoint of the right common femoral artery and a final image was obtained and stored in patient's permanent medical record. Access was then exchanged over an 018 guidewire for a micropuncture sheath. Access was then exchanged over an 035 wire which was advanced under fluoroscopic guidance for a 6 French sheath. An Omni flush catheter was advanced over the guidewire and reformed in the distal abdominal aorta. From this location a pelvic  angiogram was performed. No active extravasation identified. 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 No significant identification of anterior division from the internal iliac artery on the right. Large anterior division from the left internal iliac artery. Using standard catheter and guidewire technique, the Omni flush catheter was used to selectively cannulate the left common iliac artery. Guidewire and catheter were then advanced to the mid femoral artery on the left in order to advance the catheter and subsequently a 35 cm sheath into the common iliac artery on the left side. Selective cannulation of the left internal iliac artery was performed. An angiogram of the left internal iliac artery was then performed from the glide catheter which was placed into the proximal portion of this artery. Again no active extravasation identified, however numerous small vessels seen scattered throughout the pelvis from the anterior division. The anterior division of the internal iliac artery on the left side was then cannulated using standard catheter and guidewire technique with the glide catheter and a Glidewire. Once the catheter was in position within the distal anterior division a repeat angiogram was performed with only 1 region suggestive of active bleeding and the rest with angiogenesis from pelvic radiation. The catheter was then flushed and Gel-Foam slurry was made at the table and slowly injected through the catheter under fluoroscopic guidance for adequacy of Gel-Foam embolization. After embolization, Gel-Foam was then cleared from the catheter with the guidewire and then slow gentle flushing. Final image  demonstrates slow injection of contrast and a static contrast column indicating satisfactory Gel-Foam embolization of the entire anterior division. The sheath was then partially withdrawn to  the right common iliac artery and the guidewire was reversed in order to create a Waltman loop and advanced the glide catheter into the abdominal aorta. The guidewire was again reversed into its normal position and the catheter was used to selectively cannulate the right internal iliac artery. With the glide catheter tip in the right internal iliac artery, a selective angiogram of this vessel was performed. No significant anterior division identified. The posterior division does demonstrate a few arterial branches filling the hemipelvis. Multiple attempts using coaxial technique was attempted to fill the small vessels in the right hemipelvis potentially contributing to the colonic hemorrhage without success. Using standard catheter and guidewire technique, catheter and guidewire were advanced into the distal abdominal aorta. And aortic angiogram was then performed in order to locate the inferior mesenteric artery and also scout for potential medial sacral artery. The IMA was cannulated using a Sim catheter and Glidewire. A dedicated inferior mesenteric angiogram was then performed. No identification of any active extravasation. No significant filling of the rectal arteries. Coaxial technique was used to advance the microcatheter and microwire into the superior rectal artery and a dedicated superior rectal artery angiogram was performed. This angiogram was performed separately secondary to abrupt truncation of this vessel during the initial IMA angiogram. With the microcatheter in the superior rectal artery, no significant filling of the rectum identified. Sheath injection of the right common femoral artery was performed in order to evaluate access site prior to deployment of the Angio-Seal device. Angio-Seal was used for closure without complication. IMPRESSION: 1. Successful Gel-Foam embolization of the anterior division of the left internal iliac artery. No active extravasation identified, however embolization of  the pelvic arterial anatomy from this location seem justified based on CT findings and angiogram in order to decrease the likelihood of active arterial bleed. 2. Attempted access to the right internal iliac artery anterior division was unsuccessful. No active extravasation and overall decreased arterial filling compared to the left. 3. IMA and superior rectal artery angiogram fail to demonstrate any active extravasation. 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 During colonoscopy biopsy, patient began bleeding excessively. IR requested for urgent arterial embolization for active exsanguination. EXAM: Angiogram with embolization ANESTHESIA/SEDATION: General anesthesia CONTRAST:  100 mL Omnipaque  300 FLUOROSCOPY: Radiation Exposure Index (as provided by the fluoroscopic device): 495 mGy Kerma COMPLICATIONS: None immediate. PROCEDURE: Informed consent was obtained from the patient following explanation of the procedure, risks, benefits and alternatives. The patient understands, agrees and consents for the procedure. All questions were addressed. A time out was performed prior to the initiation of the procedure. Maximal barrier sterile technique utilized including caps, mask, sterile gowns, sterile gloves, large sterile drape, hand hygiene, and Betadine prep. In a supine position, the right groin was prepped and draped in the usual sterile fashion. Ultrasound was used to evaluate the right common femoral artery which was pulsatile and anechoic indicating patency. A needle was advanced with ultrasound guidance from the skin incision to the midpoint of the right common femoral artery and a final image was obtained and stored in patient's permanent medical record. Access was then exchanged over an 018 guidewire for a micropuncture sheath. Access was  then  exchanged over an 035 wire which was advanced under fluoroscopic guidance for a 6 French sheath. An Omni flush catheter was advanced over the guidewire and reformed in the distal abdominal aorta. From this location a pelvic angiogram was performed. No active extravasation identified. 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 No significant identification of anterior division from the internal iliac artery on the right. Large anterior division from the left internal iliac artery. Using standard catheter and guidewire technique, the Omni flush catheter was used to selectively cannulate the left common iliac artery. Guidewire and catheter were then advanced to the mid femoral artery on the left in order to advance the catheter and subsequently a 35 cm sheath into the common iliac artery on the left side. Selective cannulation of the left internal iliac artery was performed. An angiogram of the left internal iliac artery was then performed from the glide catheter which was placed into the proximal portion of this artery. Again no active extravasation identified, however numerous small vessels seen scattered throughout the pelvis from the anterior division. The anterior division of the internal iliac artery on the left side was then cannulated using standard catheter and guidewire technique with the glide catheter and a Glidewire. Once the catheter was in position within the distal anterior division a repeat angiogram was performed with only 1 region suggestive of active bleeding and the rest with angiogenesis from pelvic radiation. The catheter was then flushed and Gel-Foam slurry was made at the table and slowly injected through the catheter under fluoroscopic guidance for adequacy of Gel-Foam embolization. After embolization, Gel-Foam was then cleared from the catheter with the guidewire and then  slow gentle flushing. Final image demonstrates slow injection of contrast and a static contrast column indicating satisfactory Gel-Foam embolization of the entire anterior division. The sheath was then partially withdrawn to the right common iliac artery and the guidewire was reversed in order to create a Waltman loop and advanced the glide catheter into the abdominal aorta. The guidewire was again reversed into its normal position and the catheter was used to selectively cannulate the right internal iliac artery. With the glide catheter tip in the right internal iliac artery, a selective angiogram of this vessel was performed. No significant anterior division identified. The posterior division does demonstrate a few arterial branches filling the hemipelvis. Multiple attempts using coaxial technique was attempted to fill the small vessels in the right hemipelvis potentially contributing to the colonic hemorrhage without success. Using standard catheter and guidewire technique, catheter and guidewire were advanced into the distal abdominal aorta. And aortic angiogram was then performed in order to locate the inferior mesenteric artery and also scout for potential medial sacral artery. The IMA was cannulated using a Sim catheter and Glidewire. A dedicated inferior mesenteric angiogram was then performed. No identification of any active extravasation. No significant filling of the rectal arteries. Coaxial technique was used to advance the microcatheter and microwire into the superior rectal artery and a dedicated superior rectal artery angiogram was performed. This angiogram was performed separately secondary to abrupt truncation of this vessel during the initial IMA angiogram. With the microcatheter in the superior rectal artery, no significant filling of the rectum identified. Sheath injection of the right common femoral artery was performed in order to evaluate access site prior to deployment of the Angio-Seal  device. Angio-Seal was used for closure without complication. IMPRESSION: 1. Successful Gel-Foam embolization  of the anterior division of the left internal iliac artery. No active extravasation identified, however embolization of the pelvic arterial anatomy from this location seem justified based on CT findings and angiogram in order to decrease the likelihood of active arterial bleed. 2. Attempted access to the right internal iliac artery anterior division was unsuccessful. No active extravasation and overall decreased arterial filling compared to the left. 3. IMA and superior rectal artery angiogram fail to demonstrate any active extravasation. 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 During colonoscopy biopsy, patient began bleeding excessively. IR requested for urgent arterial embolization for active exsanguination. EXAM: Angiogram with embolization ANESTHESIA/SEDATION: General anesthesia CONTRAST:  100 mL Omnipaque  300 FLUOROSCOPY: Radiation Exposure Index (as provided by the fluoroscopic device): 495 mGy Kerma COMPLICATIONS: None immediate. PROCEDURE: Informed consent was obtained from the patient following explanation of the procedure, risks, benefits and alternatives. The patient understands, agrees and consents for the procedure. All questions were addressed. A time out was performed prior to the initiation of the procedure. Maximal barrier sterile technique utilized including caps, mask, sterile gowns, sterile gloves, large sterile drape, hand hygiene, and Betadine prep. In a supine position, the right groin was prepped and draped in the usual sterile fashion. Ultrasound was used to evaluate the right common femoral artery which was pulsatile and anechoic indicating patency. A needle was advanced with ultrasound guidance  from the skin incision to the midpoint of the right common femoral artery and a final image was obtained and stored in patient's permanent medical record. Access was then exchanged over an 018 guidewire for a micropuncture sheath. Access was then exchanged over an 035 wire which was advanced under fluoroscopic guidance for a 6 French sheath. An Omni flush catheter was advanced over the guidewire and reformed in the distal abdominal aorta. From this location a pelvic angiogram was performed. No active extravasation identified. 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 No significant identification of anterior division from the internal iliac artery on the right. Large anterior division from the left internal iliac artery. Using standard catheter and guidewire technique, the Omni flush catheter was used to selectively cannulate the left common iliac artery. Guidewire and catheter were then advanced to the mid femoral artery on the left in order to advance the catheter and subsequently a 35 cm sheath into the common iliac artery on the left side. Selective cannulation of the left internal iliac artery was performed. An angiogram of the left internal iliac artery was then performed from the glide catheter which was placed into the proximal portion of this artery. Again no active extravasation identified, however numerous small vessels seen scattered throughout the pelvis from the anterior division. The anterior division of the internal iliac artery on the left side was then cannulated using standard catheter and guidewire technique with the glide catheter and a Glidewire. Once the catheter was in position within the distal anterior division a repeat angiogram was performed with only 1 region suggestive of active bleeding and the rest with angiogenesis from pelvic radiation. The catheter was then  flushed and Gel-Foam slurry was made at the table and slowly injected through the catheter under fluoroscopic guidance for adequacy of Gel-Foam embolization. After embolization, Gel-Foam was then cleared from the catheter with  the guidewire and then slow gentle flushing. Final image demonstrates slow injection of contrast and a static contrast column indicating satisfactory Gel-Foam embolization of the entire anterior division. The sheath was then partially withdrawn to the right common iliac artery and the guidewire was reversed in order to create a Waltman loop and advanced the glide catheter into the abdominal aorta. The guidewire was again reversed into its normal position and the catheter was used to selectively cannulate the right internal iliac artery. With the glide catheter tip in the right internal iliac artery, a selective angiogram of this vessel was performed. No significant anterior division identified. The posterior division does demonstrate a few arterial branches filling the hemipelvis. Multiple attempts using coaxial technique was attempted to fill the small vessels in the right hemipelvis potentially contributing to the colonic hemorrhage without success. Using standard catheter and guidewire technique, catheter and guidewire were advanced into the distal abdominal aorta. And aortic angiogram was then performed in order to locate the inferior mesenteric artery and also scout for potential medial sacral artery. The IMA was cannulated using a Sim catheter and Glidewire. A dedicated inferior mesenteric angiogram was then performed. No identification of any active extravasation. No significant filling of the rectal arteries. Coaxial technique was used to advance the microcatheter and microwire into the superior rectal artery and a dedicated superior rectal artery angiogram was performed. This angiogram was performed separately secondary to abrupt truncation of this vessel during the initial IMA  angiogram. With the microcatheter in the superior rectal artery, no significant filling of the rectum identified. Sheath injection of the right common femoral artery was performed in order to evaluate access site prior to deployment of the Angio-Seal device. Angio-Seal was used for closure without complication. IMPRESSION: 1. Successful Gel-Foam embolization of the anterior division of the left internal iliac artery. No active extravasation identified, however embolization of the pelvic arterial anatomy from this location seem justified based on CT findings and angiogram in order to decrease the likelihood of active arterial bleed. 2. Attempted access to the right internal iliac artery anterior division was unsuccessful. No active extravasation and overall decreased arterial filling compared to the left. 3. IMA and superior rectal artery angiogram fail to demonstrate any active extravasation. 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 During colonoscopy biopsy, patient began bleeding excessively. IR requested for urgent arterial embolization for active exsanguination. EXAM: Angiogram with embolization ANESTHESIA/SEDATION: General anesthesia CONTRAST:  100 mL Omnipaque  300 FLUOROSCOPY: Radiation Exposure Index (as provided by the fluoroscopic device): 495 mGy Kerma COMPLICATIONS: None immediate. PROCEDURE: Informed consent was obtained from the patient following explanation of the procedure, risks, benefits and alternatives. The patient understands, agrees and consents for the procedure. All questions were addressed. A time out was performed prior to the initiation of the procedure. Maximal barrier sterile technique utilized including caps, mask, sterile gowns, sterile gloves, large sterile drape, hand hygiene, and Betadine prep. In a supine  position, the right groin was prepped and draped in the usual sterile fashion. Ultrasound was used to evaluate the right common femoral artery which was pulsatile and anechoic indicating patency. A needle was advanced with ultrasound guidance from the skin incision to the midpoint of the right common femoral artery and a final image was obtained and stored in patient's permanent medical record. Access was then exchanged  over an 018 guidewire for a micropuncture sheath. Access was then exchanged over an 035 wire which was advanced under fluoroscopic guidance for a 6 French sheath. An Omni flush catheter was advanced over the guidewire and reformed in the distal abdominal aorta. From this location a pelvic angiogram was performed. No active extravasation identified. 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 No significant identification of anterior division from the internal iliac artery on the right. Large anterior division from the left internal iliac artery. Using standard catheter and guidewire technique, the Omni flush catheter was used to selectively cannulate the left common iliac artery. Guidewire and catheter were then advanced to the mid femoral artery on the left in order to advance the catheter and subsequently a 35 cm sheath into the common iliac artery on the left side. Selective cannulation of the left internal iliac artery was performed. An angiogram of the left internal iliac artery was then performed from the glide catheter which was placed into the proximal portion of this artery. Again no active extravasation identified, however numerous small vessels seen scattered throughout the pelvis from the anterior division. The anterior division of the internal iliac artery on the left side was then cannulated using standard catheter and guidewire technique with the glide catheter and a  Glidewire. Once the catheter was in position within the distal anterior division a repeat angiogram was performed with only 1 region suggestive of active bleeding and the rest with angiogenesis from pelvic radiation. The catheter was then flushed and Gel-Foam slurry was made at the table and slowly injected through the catheter under fluoroscopic guidance for adequacy of Gel-Foam embolization. After embolization, Gel-Foam was then cleared from the catheter with the guidewire and then slow gentle flushing. Final image demonstrates slow injection of contrast and a static contrast column indicating satisfactory Gel-Foam embolization of the entire anterior division. The sheath was then partially withdrawn to the right common iliac artery and the guidewire was reversed in order to create a Waltman loop and advanced the glide catheter into the abdominal aorta. The guidewire was again reversed into its normal position and the catheter was used to selectively cannulate the right internal iliac artery. With the glide catheter tip in the right internal iliac artery, a selective angiogram of this vessel was performed. No significant anterior division identified. The posterior division does demonstrate a few arterial branches filling the hemipelvis. Multiple attempts using coaxial technique was attempted to fill the small vessels in the right hemipelvis potentially contributing to the colonic hemorrhage without success. Using standard catheter and guidewire technique, catheter and guidewire were advanced into the distal abdominal aorta. And aortic angiogram was then performed in order to locate the inferior mesenteric artery and also scout for potential medial sacral artery. The IMA was cannulated using a Sim catheter and Glidewire. A dedicated inferior mesenteric angiogram was then performed. No identification of any active extravasation. No significant filling of the rectal arteries. Coaxial technique was used to advance the  microcatheter and microwire into the superior rectal artery and a dedicated superior rectal artery angiogram was performed. This angiogram was performed separately secondary to abrupt truncation of this vessel during the initial IMA angiogram. With the microcatheter in the superior rectal artery, no significant filling of the rectum identified. Sheath injection of the right common femoral artery was performed in order to evaluate access site prior to deployment of the Angio-Seal device. Angio-Seal was used  for closure without complication. IMPRESSION: 1. Successful Gel-Foam embolization of the anterior division of the left internal iliac artery. No active extravasation identified, however embolization of the pelvic arterial anatomy from this location seem justified based on CT findings and angiogram in order to decrease the likelihood of active arterial bleed. 2. Attempted access to the right internal iliac artery anterior division was unsuccessful. No active extravasation and overall decreased arterial filling compared to the left. 3. IMA and superior rectal artery angiogram fail to demonstrate any active extravasation. 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 During colonoscopy biopsy, patient began bleeding excessively. IR requested for urgent arterial embolization for active exsanguination. EXAM: Angiogram with embolization ANESTHESIA/SEDATION: General anesthesia CONTRAST:  100 mL Omnipaque  300 FLUOROSCOPY: Radiation Exposure Index (as provided by the fluoroscopic device): 495 mGy Kerma COMPLICATIONS: None immediate. PROCEDURE: Informed consent was obtained from the patient following explanation of the procedure, risks, benefits and alternatives. The patient understands, agrees and consents for the procedure. All questions were addressed. A  time out was performed prior to the initiation of the procedure. Maximal barrier sterile technique utilized including caps, mask, sterile gowns, sterile gloves, large sterile drape, hand hygiene, and Betadine prep. In a supine position, the right groin was prepped and draped in the usual sterile fashion. Ultrasound was used to evaluate the right common femoral artery which was pulsatile and anechoic indicating patency. A needle was advanced with ultrasound guidance from the skin incision to the midpoint of the right common femoral artery and a final image was obtained and stored in patient's permanent medical record. Access was then exchanged over an 018 guidewire for a micropuncture sheath. Access was then exchanged over an 035 wire which was advanced under fluoroscopic guidance for a 6 French sheath. An Omni flush catheter was advanced over the guidewire and reformed in the distal abdominal aorta. From this location a pelvic angiogram was performed. No active extravasation identified. 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 No significant identification of anterior division from the internal iliac artery on the right. Large anterior division from the left internal iliac artery. Using standard catheter and guidewire technique, the Omni flush catheter was used to selectively cannulate the left common iliac artery. Guidewire and catheter were then advanced to the mid femoral artery on the left in order to advance the catheter and subsequently a 35 cm sheath into the common iliac artery on the left side. Selective cannulation of the left internal iliac artery was performed. An angiogram of the left internal iliac artery was then performed from the glide catheter which was placed into the proximal portion of this artery. Again no active extravasation identified, however numerous small vessels seen  scattered throughout the pelvis from the anterior division. The anterior division of the internal iliac artery on the left side was then cannulated using standard catheter and guidewire technique with the glide catheter and a Glidewire. Once the catheter was in position within the distal anterior division a repeat angiogram was performed with only 1 region suggestive of active bleeding and the rest with angiogenesis from pelvic radiation. The catheter was then flushed and Gel-Foam slurry was made at the table and slowly injected through the catheter under fluoroscopic guidance for adequacy of Gel-Foam embolization. After embolization, Gel-Foam  was then cleared from the catheter with the guidewire and then slow gentle flushing. Final image demonstrates slow injection of contrast and a static contrast column indicating satisfactory Gel-Foam embolization of the entire anterior division. The sheath was then partially withdrawn to the right common iliac artery and the guidewire was reversed in order to create a Waltman loop and advanced the glide catheter into the abdominal aorta. The guidewire was again reversed into its normal position and the catheter was used to selectively cannulate the right internal iliac artery. With the glide catheter tip in the right internal iliac artery, a selective angiogram of this vessel was performed. No significant anterior division identified. The posterior division does demonstrate a few arterial branches filling the hemipelvis. Multiple attempts using coaxial technique was attempted to fill the small vessels in the right hemipelvis potentially contributing to the colonic hemorrhage without success. Using standard catheter and guidewire technique, catheter and guidewire were advanced into the distal abdominal aorta. And aortic angiogram was then performed in order to locate the inferior mesenteric artery and also scout for potential medial sacral artery. The IMA was cannulated using a  Sim catheter and Glidewire. A dedicated inferior mesenteric angiogram was then performed. No identification of any active extravasation. No significant filling of the rectal arteries. Coaxial technique was used to advance the microcatheter and microwire into the superior rectal artery and a dedicated superior rectal artery angiogram was performed. This angiogram was performed separately secondary to abrupt truncation of this vessel during the initial IMA angiogram. With the microcatheter in the superior rectal artery, no significant filling of the rectum identified. Sheath injection of the right common femoral artery was performed in order to evaluate access site prior to deployment of the Angio-Seal device. Angio-Seal was used for closure without complication. IMPRESSION: 1. Successful Gel-Foam embolization of the anterior division of the left internal iliac artery. No active extravasation identified, however embolization of the pelvic arterial anatomy from this location seem justified based on CT findings and angiogram in order to decrease the likelihood of active arterial bleed. 2. Attempted access to the right internal iliac artery anterior division was unsuccessful. No active extravasation and overall decreased arterial filling compared to the left. 3. IMA and superior rectal artery angiogram fail to demonstrate any active extravasation. Electronically Signed   By: Cordella Banner   On: 06/14/2024 11:01      Assessment and Plan:  1) Colo uterine fistula 2) Hematochezia 3) Vaginal bleeding 67 year old female admitted with Colo uterine fistula, FTT.  Completed treatment for C. difficile.  Had acute bleeding on 9/30, prompting CTA which was notable for persistent Colo uterine fistula with stool but also notes enlarged inferior mesenteric lymph nodes measuring up to 2 x 1.5 cm with concern for nodal metastatic disease.  Underwent flexible sigmoidoscopy on 10/2 and there was an obstruction in the  rectosigmoid colon, located approximately 60 cm from the anal verge with mild oozing upon endoscope entry.  This bled further with mucosal biopsies, which was controlled endoscopically with Nexpowder.  While there was no rectal bleeding at the conclusion of the procedure, she continued to have vaginal bleeding, presumably through the [employer] uterine fistula.  Was taken to IR with embolization of the left internal iliac artery.  Transfused 3 units with good response (has received 6 units total on this admission).  Pathology results show predominantly fibropurulent debris and focal fragments of crushed granulation tissue with dystrophic calcifications.  Additional immunohistochemical staining underway.  Patient evaluated by General Surgery and  GYN Oncology.  High risk for colon resection.  I had an in-depth conversation with patient's brother and sister at bedside today.  We discussed recent imaging, endoscopic, and lab results.  Discussed yesterday's flexible sigmoidoscopy at length, to include endoscopic findings and bleeding event.  We discussed DDx for her Colo uterine fistula, and well malignant source remains high on the differential, biopsies are not definitive.  Given her underlying comorbidities, surgical risks are quite elevated, but I certainly defer to the expertise of the General Surgery and GYN Oncology consulting services on that front.  Palliative care conversation  appropriate.  GI service will remain available as needed.  Dr. Belvie Just will be covering the GI service through the weekend should any questions or issues arise.  A total of 50 minutes of time was spent on this encounter, including in depth chart review, independent review of results as outlined above, communicating results with the patient directly, face-to-face time with the patient, coordinating care, and ordering studies and medications as appropriate, and documentation.   Sandor LULLA Flatter, DO  06/14/2024, 5:08 PM [facility] Pager [phone]

## 2024-06-14 NOTE — Progress Notes (Signed)
 Patient off unit headed towards HD.

## 2024-06-14 NOTE — Anesthesia Postprocedure Evaluation (Signed)
 Anesthesia Post Note  Patient: Tracy Jennings  Procedure(s) Performed: SIGMOIDOSCOPY, FLEXIBLE IR ANGIOGRAM PELVIS SELECTIVE OR SUPRASELECTIVE IR EMBO ART  VEN HEMORR LYMPH EXTRAV  INC GUIDE ROADMAPPING IR US  GUIDE VASC ACCESS RIGHT IR ABD/PEL BILAT ART BRANCH 3RD ORDER IR ANGIOGRAM VISCERAL SELECTIVE     Patient location during evaluation: PACU Anesthesia Type: General Level of consciousness: awake and confused Pain management: pain level controlled Vital Signs Assessment: post-procedure vital signs reviewed and stable Respiratory status: spontaneous breathing, nonlabored ventilation, respiratory function stable and patient connected to nasal cannula oxygen Cardiovascular status: blood pressure returned to baseline and stable Postop Assessment: no apparent nausea or vomiting Anesthetic complications: no   No notable events documented.          Lynwood MARLA Cornea

## 2024-06-14 NOTE — Progress Notes (Signed)
 OT Cancellation Note  Patient Details Name: Tracy Jennings MRN: [id number] DOB: 30-Jul-1956   Cancelled Treatment:    Reason Eval/Treat Not Completed: Patient at procedure or test/ unavailable (HD)  Ely Molt 06/14/2024, 9:22 AM

## 2024-06-15 DIAGNOSIS — I82C29 Chronic embolism and thrombosis of unspecified internal jugular vein: Secondary | ICD-10-CM

## 2024-06-15 DIAGNOSIS — K6289 Other specified diseases of anus and rectum: Secondary | ICD-10-CM

## 2024-06-15 DIAGNOSIS — D649 Anemia, unspecified: Secondary | ICD-10-CM | POA: Diagnosis not present

## 2024-06-15 DIAGNOSIS — A0472 Enterocolitis due to Clostridium difficile, not specified as recurrent: Secondary | ICD-10-CM | POA: Diagnosis not present

## 2024-06-15 DIAGNOSIS — G9341 Metabolic encephalopathy: Secondary | ICD-10-CM

## 2024-06-15 DIAGNOSIS — N824 Other female intestinal-genital tract fistulae: Secondary | ICD-10-CM | POA: Diagnosis not present

## 2024-06-15 DIAGNOSIS — N939 Abnormal uterine and vaginal bleeding, unspecified: Secondary | ICD-10-CM

## 2024-06-15 DIAGNOSIS — J90 Pleural effusion, not elsewhere classified: Secondary | ICD-10-CM | POA: Insufficient documentation

## 2024-06-15 DIAGNOSIS — C539 Malignant neoplasm of cervix uteri, unspecified: Secondary | ICD-10-CM | POA: Diagnosis not present

## 2024-06-15 LAB — CBC
HCT: 32.1 % — ABNORMAL LOW (ref 36.0–46.0)
Hemoglobin: 10.6 g/dL — ABNORMAL LOW (ref 12.0–15.0)
MCH: 29.8 pg (ref 26.0–34.0)
MCHC: 33 g/dL (ref 30.0–36.0)
MCV: 90.2 fL (ref 80.0–100.0)
Platelets: 203 K/uL (ref 150–400)
RBC: 3.56 MIL/uL — ABNORMAL LOW (ref 3.87–5.11)
RDW: 18.8 % — ABNORMAL HIGH (ref 11.5–15.5)
WBC: 24.2 K/uL — ABNORMAL HIGH (ref 4.0–10.5)
nRBC: 0.1 % (ref 0.0–0.2)

## 2024-06-15 LAB — GLUCOSE, CAPILLARY
Glucose-Capillary: 74 mg/dL (ref 70–99)
Glucose-Capillary: 70 mg/dL (ref 70–99)
Glucose-Capillary: 64 mg/dL — ABNORMAL LOW (ref 70–99)
Glucose-Capillary: 139 mg/dL — ABNORMAL HIGH (ref 70–99)
Glucose-Capillary: 73 mg/dL (ref 70–99)
Glucose-Capillary: 71 mg/dL (ref 70–99)

## 2024-06-15 LAB — BASIC METABOLIC PANEL WITH GFR
Anion gap: 14 (ref 5–15)
BUN: 28 mg/dL — ABNORMAL HIGH (ref 8–23)
CO2: 25 mmol/L (ref 22–32)
Calcium: 8.5 mg/dL — ABNORMAL LOW (ref 8.9–10.3)
Chloride: 97 mmol/L — ABNORMAL LOW (ref 98–111)
Creatinine, Ser: 3.53 mg/dL — ABNORMAL HIGH (ref 0.44–1.00)
GFR, Estimated: 14 mL/min — ABNORMAL LOW (ref >60)
Glucose, Bld: 72 mg/dL (ref 70–99)
Potassium: 4 mmol/L (ref 3.5–5.1)
Sodium: 136 mmol/L (ref 135–145)

## 2024-06-15 MED ORDER — LABETALOL HCL 5 MG/ML IV SOLN
10.0000 mg | Freq: Four times a day (QID) | INTRAVENOUS | Status: DC
Start: 2024-06-15 — End: 2024-06-19
  Administered 2024-06-15 – 2024-06-19 (×16): 10 mg via INTRAVENOUS
  Filled 2024-06-15 (×14): qty 4

## 2024-06-15 MED ORDER — SODIUM CHLORIDE 0.9 % IV SOLN
1.0000 g | Freq: Once | INTRAVENOUS | Status: AC
  Administered 2024-06-15: 1 g via INTRAVENOUS
  Filled 2024-06-15: qty 10

## 2024-06-15 MED ORDER — LORAZEPAM 2 MG/ML IJ SOLN
1.0000 mg | Freq: Once | INTRAMUSCULAR | Status: AC
  Administered 2024-06-15: 1 mg via INTRAVENOUS
  Filled 2024-06-15: qty 1

## 2024-06-15 MED ORDER — DEXTROSE 50 % IV SOLN
12.5000 g | INTRAVENOUS | Status: AC
  Administered 2024-06-15: 12.5 g via INTRAVENOUS

## 2024-06-15 MED ORDER — SODIUM CHLORIDE 0.9 % IV SOLN
1.0000 g | Freq: Every evening | INTRAVENOUS | Status: DC
  Administered 2024-06-16: 1 g via INTRAVENOUS
  Filled 2024-06-15 (×4): qty 10

## 2024-06-15 MED ORDER — VANCOMYCIN HCL 750 MG/150ML IV SOLN
750.0000 mg | Freq: Once | INTRAVENOUS | Status: AC
  Administered 2024-06-15: 750 mg via INTRAVENOUS
  Filled 2024-06-15: qty 150

## 2024-06-15 MED ORDER — DEXTROSE 5 % IV SOLN
INTRAVENOUS | Status: AC
Start: 2024-06-15 — End: 2024-06-16

## 2024-06-15 MED ORDER — DEXTROSE 50 % IV SOLN
INTRAVENOUS | Status: AC
  Filled 2024-06-15: qty 50

## 2024-06-15 MED ORDER — LOSARTAN POTASSIUM 50 MG PO TABS: 50.0000 mg | ORAL_TABLET | Freq: Every day | ORAL | Status: DC

## 2024-06-15 MED ORDER — VANCOMYCIN HCL 500 MG/100ML IV SOLN
500.0000 mg | INTRAVENOUS | Status: DC
  Filled 2024-06-15: qty 100

## 2024-06-15 NOTE — Assessment & Plan Note (Signed)
 Dementia At baseline, patient interactive and lives at home, familiar with surroundings but with memory loss.  Here, she has been alert but oriented only to self for most of hospital stay.  Since flex sig on 10/2, she is less responsive, somnolent, due to blood loss, malnutrition primarily.  No focal signs but exam limited due to somnolence. - Standard delirium precautions: blinds open and lights on during day, TV off, minimize interruptions at night, glasses/hearing aids, PT/OT, avoiding Beers list medications   - SLP consult - Meds to IV, advance as mentation improves - Hold iron, vit B12, famotidine , prosource/Boost until mentation improves

## 2024-06-15 NOTE — Assessment & Plan Note (Signed)
 Failure to thrive Recently failed trial of tube feeds due to worsening of diarrhea.   Not a candidate for TPN. - Consult Palliative care - Consult Dietitian

## 2024-06-15 NOTE — Progress Notes (Signed)
  [facility] Progress Note   Subjective:   Patient seen and examined at bedside.  Sleepy, but responds to questions and an very few commands.  Denies bleeding overnight.  Tolerated dialysis well.  Denies CP, SOB, abdominal pain and n/v/d.  Hgb stable for last 24 hours.   Objective Vitals:   06/15/24 0400 06/15/24 0800 06/15/24 1200 06/15/24 1350  BP: (!) 184/71 (!) 157/64 (!) 166/65 (!) 185/65  Pulse: 81  80 86  Resp: 11 20 (!) 21 12  Temp:   98.9 F (37.2 C)   TempSrc:   Axillary   SpO2: 95% 94% 91% 97%  Weight:      Height:       Physical Exam General:chronically ill appearing, thin, somnolent female in NAD Heart:RRR Lungs:CTAB, nml WOB on RA Abdomen:soft, NTND Extremities:no LE edema Dialysis Access: LU AVG +b/t   Filed Weights   06/14/24 0900 06/14/24 1219 06/14/24 1224  Weight: 53.2 kg 52.2 kg 52.2 kg   No intake or output data in the 24 hours ending 06/15/24 1443  Additional Objective Labs: Basic Metabolic Panel: Recent Labs  Lab 06/11/24 0028 06/13/24 0355 06/13/24 1302 06/14/24 0922 06/15/24 0500  NA 139 136 137 136 136  K 4.7 4.9 4.7 5.2* 4.0  CL 100 101  --  100 97*  CO2 27 25  --  21* 25  GLUCOSE 98 109*  --  111* 72  BUN 46* 41*  --  49* 28*  CREATININE 5.43* 4.76*  --  5.29* 3.53*  CALCIUM  8.2* 8.4*  --  8.5* 8.5*  PHOS 3.9  --   --   --   --    Liver Function Tests: Recent Labs  Lab 06/11/24 0028 06/13/24 0355 06/14/24 0922  AST  --  13* 12*  ALT  --  10 9  ALKPHOS  --  51 43  BILITOT  --  0.5 1.3*  PROT  --  5.8* 5.7*  ALBUMIN 1.7* 1.8* 2.2*   No results for input(s): LIPASE, AMYLASE in the last 168 hours. CBC: Recent Labs  Lab 06/11/24 0028 06/11/24 2150 06/12/24 1544 06/13/24 0355 06/13/24 1302 06/13/24 1811 06/14/24 0500 06/14/24 1456 06/15/24 0500  WBC 14.3* 16.9*  --  17.2*  --  21.9*  --   --  24.2*  NEUTROABS 11.9* 14.2*  --   --   --   --   --   --   --   HGB 7.1* 6.8*   < > 9.5*   < > 6.1*  10.2* 10.7* 10.6*  HCT 23.5* 22.2*   < > 30.0*   < > 19.6* 30.1* 31.9* 32.1*  MCV 96.7 96.1  --  92.6  --  95.1  --   --  90.2  PLT 249 234  --  219  --  163  --   --  203   < > = values in this interval not displayed.    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 During colonoscopy biopsy, patient began bleeding excessively. IR requested for urgent arterial embolization for active exsanguination. EXAM: Angiogram with embolization ANESTHESIA/SEDATION: General anesthesia CONTRAST:  100 mL Omnipaque  300 FLUOROSCOPY: Radiation Exposure Index (as provided by the fluoroscopic device): 495 mGy Kerma COMPLICATIONS: None immediate. PROCEDURE: Informed consent was obtained from the patient following explanation of the procedure, risks, benefits and alternatives. The patient understands, agrees and consents for  the procedure. All questions were addressed. A time out was performed prior to the initiation of the procedure. Maximal barrier sterile technique utilized including caps, mask, sterile gowns, sterile gloves, large sterile drape, hand hygiene, and Betadine prep. In a supine position, the right groin was prepped and draped in the usual sterile fashion. Ultrasound was used to evaluate the right common femoral artery which was pulsatile and anechoic indicating patency. A needle was advanced with ultrasound guidance from the skin incision to the midpoint of the right common femoral artery and a final image was obtained and stored in patient's permanent medical record. Access was then exchanged over an 018 guidewire for a micropuncture sheath. Access was then exchanged over an 035 wire which was advanced under fluoroscopic guidance for a 6 French sheath. An Omni flush catheter was advanced over the guidewire and reformed in the distal abdominal aorta. From this location a pelvic angiogram  was performed. No active extravasation identified. 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 No significant identification of anterior division from the internal iliac artery on the right. Large anterior division from the left internal iliac artery. Using standard catheter and guidewire technique, the Omni flush catheter was used to selectively cannulate the left common iliac artery. Guidewire and catheter were then advanced to the mid femoral artery on the left in order to advance the catheter and subsequently a 35 cm sheath into the common iliac artery on the left side. Selective cannulation of the left internal iliac artery was performed. An angiogram of the left internal iliac artery was then performed from the glide catheter which was placed into the proximal portion of this artery. Again no active extravasation identified, however numerous small vessels seen scattered throughout the pelvis from the anterior division. The anterior division of the internal iliac artery on the left side was then cannulated using standard catheter and guidewire technique with the glide catheter and a Glidewire. Once the catheter was in position within the distal anterior division a repeat angiogram was performed with only 1 region suggestive of active bleeding and the rest with angiogenesis from pelvic radiation. The catheter was then flushed and Gel-Foam slurry was made at the table and slowly injected through the catheter under fluoroscopic guidance for adequacy of Gel-Foam embolization. After embolization, Gel-Foam was then cleared from the catheter with the guidewire and then slow gentle flushing. Final image demonstrates slow injection of contrast and a static contrast column indicating satisfactory Gel-Foam embolization of the entire anterior division. The sheath was then partially withdrawn to the right  common iliac artery and the guidewire was reversed in order to create a Waltman loop and advanced the glide catheter into the abdominal aorta. The guidewire was again reversed into its normal position and the catheter was used to selectively cannulate the right internal iliac artery. With the glide catheter tip in the right internal iliac artery, a selective angiogram of this vessel was performed. No significant anterior division identified. The posterior division does demonstrate a few arterial branches filling the hemipelvis. Multiple attempts using coaxial technique was attempted to fill the small vessels in the right hemipelvis potentially contributing to the colonic hemorrhage without success. Using standard catheter and guidewire technique, catheter and guidewire were advanced into the distal abdominal aorta. And aortic angiogram was then performed in order to locate the inferior mesenteric artery and also scout for potential medial sacral artery. The IMA was cannulated using  a Sim catheter and Glidewire. A dedicated inferior mesenteric angiogram was then performed. No identification of any active extravasation. No significant filling of the rectal arteries. Coaxial technique was used to advance the microcatheter and microwire into the superior rectal artery and a dedicated superior rectal artery angiogram was performed. This angiogram was performed separately secondary to abrupt truncation of this vessel during the initial IMA angiogram. With the microcatheter in the superior rectal artery, no significant filling of the rectum identified. Sheath injection of the right common femoral artery was performed in order to evaluate access site prior to deployment of the Angio-Seal device. Angio-Seal was used for closure without complication. IMPRESSION: 1. Successful Gel-Foam embolization of the anterior division of the left internal iliac artery. No active extravasation identified, however embolization of the pelvic  arterial anatomy from this location seem justified based on CT findings and angiogram in order to decrease the likelihood of active arterial bleed. 2. Attempted access to the right internal iliac artery anterior division was unsuccessful. No active extravasation and overall decreased arterial filling compared to the left. 3. IMA and superior rectal artery angiogram fail to demonstrate any active extravasation. 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 During colonoscopy biopsy, patient began bleeding excessively. IR requested for urgent arterial embolization for active exsanguination. EXAM: Angiogram with embolization ANESTHESIA/SEDATION: General anesthesia CONTRAST:  100 mL Omnipaque  300 FLUOROSCOPY: Radiation Exposure Index (as provided by the fluoroscopic device): 495 mGy Kerma COMPLICATIONS: None immediate. PROCEDURE: Informed consent was obtained from the patient following explanation of the procedure, risks, benefits and alternatives. The patient understands, agrees and consents for the procedure. All questions were addressed. A time out was performed prior to the initiation of the procedure. Maximal barrier sterile technique utilized including caps, mask, sterile gowns, sterile gloves, large sterile drape, hand hygiene, and Betadine prep. In a supine position, the right groin was prepped and draped in the usual sterile fashion. Ultrasound was used to evaluate the right common femoral artery which was pulsatile and anechoic indicating patency. A needle was advanced with ultrasound guidance from the skin incision to the midpoint of the right common femoral artery and a final image was obtained and stored in patient's permanent medical record. Access was then exchanged over an 018 guidewire for a micropuncture sheath.  Access was then exchanged over an 035 wire which was advanced under fluoroscopic guidance for a 6 French sheath. An Omni flush catheter was advanced over the guidewire and reformed in the distal abdominal aorta. From this location a pelvic angiogram was performed. No active extravasation identified. 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 No significant identification of anterior division from the internal iliac artery on the right. Large anterior division from the left internal iliac artery. Using standard catheter and guidewire technique, the Omni flush catheter was used to selectively cannulate the left common iliac artery. Guidewire and catheter were then advanced to the mid femoral artery on the left in order to advance the catheter and subsequently a 35 cm sheath into the common iliac artery on the left side. Selective cannulation of the left internal iliac artery was performed. An angiogram of the left internal iliac artery was then performed from the glide catheter which was  placed into the proximal portion of this artery. Again no active extravasation identified, however numerous small vessels seen scattered throughout the pelvis from the anterior division. The anterior division of the internal iliac artery on the left side was then cannulated using standard catheter and guidewire technique with the glide catheter and a Glidewire. Once the catheter was in position within the distal anterior division a repeat angiogram was performed with only 1 region suggestive of active bleeding and the rest with angiogenesis from pelvic radiation. The catheter was then flushed and Gel-Foam slurry was made at the table and slowly injected through the catheter under fluoroscopic guidance for adequacy of Gel-Foam embolization. After embolization, Gel-Foam was then cleared from the catheter with the  guidewire and then slow gentle flushing. Final image demonstrates slow injection of contrast and a static contrast column indicating satisfactory Gel-Foam embolization of the entire anterior division. The sheath was then partially withdrawn to the right common iliac artery and the guidewire was reversed in order to create a Waltman loop and advanced the glide catheter into the abdominal aorta. The guidewire was again reversed into its normal position and the catheter was used to selectively cannulate the right internal iliac artery. With the glide catheter tip in the right internal iliac artery, a selective angiogram of this vessel was performed. No significant anterior division identified. The posterior division does demonstrate a few arterial branches filling the hemipelvis. Multiple attempts using coaxial technique was attempted to fill the small vessels in the right hemipelvis potentially contributing to the colonic hemorrhage without success. Using standard catheter and guidewire technique, catheter and guidewire were advanced into the distal abdominal aorta. And aortic angiogram was then performed in order to locate the inferior mesenteric artery and also scout for potential medial sacral artery. The IMA was cannulated using a Sim catheter and Glidewire. A dedicated inferior mesenteric angiogram was then performed. No identification of any active extravasation. No significant filling of the rectal arteries. Coaxial technique was used to advance the microcatheter and microwire into the superior rectal artery and a dedicated superior rectal artery angiogram was performed. This angiogram was performed separately secondary to abrupt truncation of this vessel during the initial IMA angiogram. With the microcatheter in the superior rectal artery, no significant filling of the rectum identified. Sheath injection of the right common femoral artery was performed in order to evaluate access site prior to deployment of  the Angio-Seal device. Angio-Seal was used for closure without complication. IMPRESSION: 1. Successful Gel-Foam embolization of the anterior division of the left internal iliac artery. No active extravasation identified, however embolization of the pelvic arterial anatomy from this location seem justified based on CT findings and angiogram in order to decrease the likelihood of active arterial bleed. 2. Attempted access to the right internal iliac artery anterior division was unsuccessful. No active extravasation and overall decreased arterial filling compared to the left. 3. IMA and superior rectal artery angiogram fail to demonstrate any active extravasation. 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 During colonoscopy biopsy, patient began bleeding excessively. IR requested for urgent arterial embolization for active exsanguination. EXAM: Angiogram with embolization ANESTHESIA/SEDATION: General anesthesia CONTRAST:  100 mL Omnipaque  300 FLUOROSCOPY: Radiation Exposure Index (as provided by the fluoroscopic device): 495 mGy Kerma COMPLICATIONS: None immediate. PROCEDURE: Informed consent was obtained from the patient following explanation  of the procedure, risks, benefits and alternatives. The patient understands, agrees and consents for the procedure. All questions were addressed. A time out was performed prior to the initiation of the procedure. Maximal barrier sterile technique utilized including caps, mask, sterile gowns, sterile gloves, large sterile drape, hand hygiene, and Betadine prep. In a supine position, the right groin was prepped and draped in the usual sterile fashion. Ultrasound was used to evaluate the right common femoral artery which was pulsatile and anechoic indicating patency. A needle was advanced with ultrasound guidance from  the skin incision to the midpoint of the right common femoral artery and a final image was obtained and stored in patient's permanent medical record. Access was then exchanged over an 018 guidewire for a micropuncture sheath. Access was then exchanged over an 035 wire which was advanced under fluoroscopic guidance for a 6 French sheath. An Omni flush catheter was advanced over the guidewire and reformed in the distal abdominal aorta. From this location a pelvic angiogram was performed. No active extravasation identified. 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 No significant identification of anterior division from the internal iliac artery on the right. Large anterior division from the left internal iliac artery. Using standard catheter and guidewire technique, the Omni flush catheter was used to selectively cannulate the left common iliac artery. Guidewire and catheter were then advanced to the mid femoral artery on the left in order to advance the catheter and subsequently a 35 cm sheath into the common iliac artery on the left side. Selective cannulation of the left internal iliac artery was performed. An angiogram of the left internal iliac artery was then performed from the glide catheter which was placed into the proximal portion of this artery. Again no active extravasation identified, however numerous small vessels seen scattered throughout the pelvis from the anterior division. The anterior division of the internal iliac artery on the left side was then cannulated using standard catheter and guidewire technique with the glide catheter and a Glidewire. Once the catheter was in position within the distal anterior division a repeat angiogram was performed with only 1 region suggestive of active bleeding and the rest with angiogenesis from pelvic radiation. The catheter was then flushed  and Gel-Foam slurry was made at the table and slowly injected through the catheter under fluoroscopic guidance for adequacy of Gel-Foam embolization. After embolization, Gel-Foam was then cleared from the catheter with the guidewire and then slow gentle flushing. Final image demonstrates slow injection of contrast and a static contrast column indicating satisfactory Gel-Foam embolization of the entire anterior division. The sheath was then partially withdrawn to the right common iliac artery and the guidewire was reversed in order to create a Waltman loop and advanced the glide catheter into the abdominal aorta. The guidewire was again reversed into its normal position and the catheter was used to selectively cannulate the right internal iliac artery. With the glide catheter tip in the right internal iliac artery, a selective angiogram of this vessel was performed. No significant anterior division identified. The posterior division does demonstrate a few arterial branches filling the hemipelvis. Multiple attempts using coaxial technique was attempted to fill the small vessels in the right hemipelvis potentially contributing to the colonic hemorrhage without success. Using standard catheter and guidewire technique, catheter and guidewire were advanced into the distal abdominal aorta. And aortic angiogram was then performed in order to locate the inferior mesenteric  artery and also scout for potential medial sacral artery. The IMA was cannulated using a Sim catheter and Glidewire. A dedicated inferior mesenteric angiogram was then performed. No identification of any active extravasation. No significant filling of the rectal arteries. Coaxial technique was used to advance the microcatheter and microwire into the superior rectal artery and a dedicated superior rectal artery angiogram was performed. This angiogram was performed separately secondary to abrupt truncation of this vessel during the initial IMA angiogram.  With the microcatheter in the superior rectal artery, no significant filling of the rectum identified. Sheath injection of the right common femoral artery was performed in order to evaluate access site prior to deployment of the Angio-Seal device. Angio-Seal was used for closure without complication. IMPRESSION: 1. Successful Gel-Foam embolization of the anterior division of the left internal iliac artery. No active extravasation identified, however embolization of the pelvic arterial anatomy from this location seem justified based on CT findings and angiogram in order to decrease the likelihood of active arterial bleed. 2. Attempted access to the right internal iliac artery anterior division was unsuccessful. No active extravasation and overall decreased arterial filling compared to the left. 3. IMA and superior rectal artery angiogram fail to demonstrate any active extravasation. 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 During colonoscopy biopsy, patient began bleeding excessively. IR requested for urgent arterial embolization for active exsanguination. EXAM: Angiogram with embolization ANESTHESIA/SEDATION: General anesthesia CONTRAST:  100 mL Omnipaque  300 FLUOROSCOPY: Radiation Exposure Index (as provided by the fluoroscopic device): 495 mGy Kerma COMPLICATIONS: None immediate. PROCEDURE: Informed consent was obtained from the patient following explanation of the procedure, risks, benefits and alternatives. The patient understands, agrees and consents for the procedure. All questions were addressed. A time out was performed prior to the initiation of the procedure. Maximal barrier sterile technique utilized including caps, mask, sterile gowns, sterile gloves, large sterile drape, hand hygiene, and Betadine prep. In a supine  position, the right groin was prepped and draped in the usual sterile fashion. Ultrasound was used to evaluate the right common femoral artery which was pulsatile and anechoic indicating patency. A needle was advanced with ultrasound guidance from the skin incision to the midpoint of the right common femoral artery and a final image was obtained and stored in patient's permanent medical record. Access was then exchanged over an 018 guidewire for a micropuncture sheath. Access was then exchanged over an 035 wire which was advanced under fluoroscopic guidance for a 6 French sheath. An Omni flush catheter was advanced over the guidewire and reformed in the distal abdominal aorta. From this location a pelvic angiogram was performed. No active extravasation identified. 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 No significant identification of anterior division from the internal iliac artery on the right. Large anterior division from the left internal iliac artery. Using standard catheter and guidewire technique, the Omni flush catheter was used to selectively cannulate the left common iliac artery. Guidewire and catheter were then advanced to the mid femoral artery on the left in order to advance the catheter and subsequently a 35 cm sheath into the common iliac artery on the left side. Selective cannulation of the left internal iliac artery was performed. An angiogram of the left internal iliac artery was  then performed from the glide catheter which was placed into the proximal portion of this artery. Again no active extravasation identified, however numerous small vessels seen scattered throughout the pelvis from the anterior division. The anterior division of the internal iliac artery on the left side was then cannulated using standard catheter and guidewire technique with the glide catheter and a  Glidewire. Once the catheter was in position within the distal anterior division a repeat angiogram was performed with only 1 region suggestive of active bleeding and the rest with angiogenesis from pelvic radiation. The catheter was then flushed and Gel-Foam slurry was made at the table and slowly injected through the catheter under fluoroscopic guidance for adequacy of Gel-Foam embolization. After embolization, Gel-Foam was then cleared from the catheter with the guidewire and then slow gentle flushing. Final image demonstrates slow injection of contrast and a static contrast column indicating satisfactory Gel-Foam embolization of the entire anterior division. The sheath was then partially withdrawn to the right common iliac artery and the guidewire was reversed in order to create a Waltman loop and advanced the glide catheter into the abdominal aorta. The guidewire was again reversed into its normal position and the catheter was used to selectively cannulate the right internal iliac artery. With the glide catheter tip in the right internal iliac artery, a selective angiogram of this vessel was performed. No significant anterior division identified. The posterior division does demonstrate a few arterial branches filling the hemipelvis. Multiple attempts using coaxial technique was attempted to fill the small vessels in the right hemipelvis potentially contributing to the colonic hemorrhage without success. Using standard catheter and guidewire technique, catheter and guidewire were advanced into the distal abdominal aorta. And aortic angiogram was then performed in order to locate the inferior mesenteric artery and also scout for potential medial sacral artery. The IMA was cannulated using a Sim catheter and Glidewire. A dedicated inferior mesenteric angiogram was then performed. No identification of any active extravasation. No significant filling of the rectal arteries. Coaxial technique was used to advance the  microcatheter and microwire into the superior rectal artery and a dedicated superior rectal artery angiogram was performed. This angiogram was performed separately secondary to abrupt truncation of this vessel during the initial IMA angiogram. With the microcatheter in the superior rectal artery, no significant filling of the rectum identified. Sheath injection of the right common femoral artery was performed in order to evaluate access site prior to deployment of the Angio-Seal device. Angio-Seal was used for closure without complication. IMPRESSION: 1. Successful Gel-Foam embolization of the anterior division of the left internal iliac artery. No active extravasation identified, however embolization of the pelvic arterial anatomy from this location seem justified based on CT findings and angiogram in order to decrease the likelihood of active arterial bleed. 2. Attempted access to the right internal iliac artery anterior division was unsuccessful. No active extravasation and overall decreased arterial filling compared to the left. 3. IMA and superior rectal artery angiogram fail to demonstrate any active extravasation. 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 During colonoscopy biopsy, patient began bleeding excessively. IR requested for urgent arterial embolization for active exsanguination. EXAM: Angiogram with embolization ANESTHESIA/SEDATION: General anesthesia CONTRAST:  100 mL Omnipaque  300 FLUOROSCOPY: Radiation Exposure Index (as provided by the fluoroscopic device): 495 mGy Kerma COMPLICATIONS: None immediate. PROCEDURE:  Informed consent was obtained from the patient following explanation of the procedure, risks, benefits and alternatives. The patient understands, agrees and consents for the procedure. All questions were addressed.  A time out was performed prior to the initiation of the procedure. Maximal barrier sterile technique utilized including caps, mask, sterile gowns, sterile gloves, large sterile drape, hand hygiene, and Betadine prep. In a supine position, the right groin was prepped and draped in the usual sterile fashion. Ultrasound was used to evaluate the right common femoral artery which was pulsatile and anechoic indicating patency. A needle was advanced with ultrasound guidance from the skin incision to the midpoint of the right common femoral artery and a final image was obtained and stored in patient's permanent medical record. Access was then exchanged over an 018 guidewire for a micropuncture sheath. Access was then exchanged over an 035 wire which was advanced under fluoroscopic guidance for a 6 French sheath. An Omni flush catheter was advanced over the guidewire and reformed in the distal abdominal aorta. From this location a pelvic angiogram was performed. No active extravasation identified. 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 No significant identification of anterior division from the internal iliac artery on the right. Large anterior division from the left internal iliac artery. Using standard catheter and guidewire technique, the Omni flush catheter was used to selectively cannulate the left common iliac artery. Guidewire and catheter were then advanced to the mid femoral artery on the left in order to advance the catheter and subsequently a 35 cm sheath into the common iliac artery on the left side. Selective cannulation of the left internal iliac artery was performed. An angiogram of the left internal iliac artery was then performed from the glide catheter which was placed into the proximal portion of this artery. Again no active extravasation identified, however numerous small vessels  seen scattered throughout the pelvis from the anterior division. The anterior division of the internal iliac artery on the left side was then cannulated using standard catheter and guidewire technique with the glide catheter and a Glidewire. Once the catheter was in position within the distal anterior division a repeat angiogram was performed with only 1 region suggestive of active bleeding and the rest with angiogenesis from pelvic radiation. The catheter was then flushed and Gel-Foam slurry was made at the table and slowly injected through the catheter under fluoroscopic guidance for adequacy of Gel-Foam embolization. After embolization, Gel-Foam was then cleared from the catheter with the guidewire and then slow gentle flushing. Final image demonstrates slow injection of contrast and a static contrast column indicating satisfactory Gel-Foam embolization of the entire anterior division. The sheath was then partially withdrawn to the right common iliac artery and the guidewire was reversed in order to create a Waltman loop and advanced the glide catheter into the abdominal aorta. The guidewire was again reversed into its normal position and the catheter was used to selectively cannulate the right internal iliac artery. With the glide catheter tip in the right internal iliac artery, a selective angiogram of this vessel was performed. No significant anterior division identified. The posterior division does demonstrate a few arterial branches filling the hemipelvis. Multiple attempts using coaxial technique was attempted to fill the small vessels in the right hemipelvis potentially contributing to the colonic hemorrhage without success. Using standard catheter and guidewire technique, catheter and guidewire were advanced into the distal abdominal aorta. And aortic angiogram was  then performed in order to locate the inferior mesenteric artery and also scout for potential medial sacral artery. The IMA was cannulated  using a Sim catheter and Glidewire. A dedicated inferior mesenteric angiogram was then performed. No identification of any active extravasation. No significant filling of the rectal arteries. Coaxial technique was used to advance the microcatheter and microwire into the superior rectal artery and a dedicated superior rectal artery angiogram was performed. This angiogram was performed separately secondary to abrupt truncation of this vessel during the initial IMA angiogram. With the microcatheter in the superior rectal artery, no significant filling of the rectum identified. Sheath injection of the right common femoral artery was performed in order to evaluate access site prior to deployment of the Angio-Seal device. Angio-Seal was used for closure without complication. IMPRESSION: 1. Successful Gel-Foam embolization of the anterior division of the left internal iliac artery. No active extravasation identified, however embolization of the pelvic arterial anatomy from this location seem justified based on CT findings and angiogram in order to decrease the likelihood of active arterial bleed. 2. Attempted access to the right internal iliac artery anterior division was unsuccessful. No active extravasation and overall decreased arterial filling compared to the left. 3. IMA and superior rectal artery angiogram fail to demonstrate any active extravasation. 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 During colonoscopy biopsy, patient began bleeding excessively. IR requested for urgent arterial embolization for active exsanguination. EXAM: Angiogram with embolization ANESTHESIA/SEDATION: General anesthesia CONTRAST:  100 mL Omnipaque  300 FLUOROSCOPY: Radiation Exposure Index (as provided by the fluoroscopic device): 495 mGy Kerma COMPLICATIONS: None  immediate. PROCEDURE: Informed consent was obtained from the patient following explanation of the procedure, risks, benefits and alternatives. The patient understands, agrees and consents for the procedure. All questions were addressed. A time out was performed prior to the initiation of the procedure. Maximal barrier sterile technique utilized including caps, mask, sterile gowns, sterile gloves, large sterile drape, hand hygiene, and Betadine prep. In a supine position, the right groin was prepped and draped in the usual sterile fashion. Ultrasound was used to evaluate the right common femoral artery which was pulsatile and anechoic indicating patency. A needle was advanced with ultrasound guidance from the skin incision to the midpoint of the right common femoral artery and a final image was obtained and stored in patient's permanent medical record. Access was then exchanged over an 018 guidewire for a micropuncture sheath. Access was then exchanged over an 035 wire which was advanced under fluoroscopic guidance for a 6 French sheath. An Omni flush catheter was advanced over the guidewire and reformed in the distal abdominal aorta. From this location a pelvic angiogram was performed. No active extravasation identified. 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 No significant identification of anterior division from the internal iliac artery on the right. Large anterior division from the left internal iliac artery. Using standard catheter and guidewire technique, the Omni flush catheter was used to selectively cannulate the left common iliac artery. Guidewire and catheter were then advanced to the mid femoral artery on the left in order to advance the catheter and subsequently a 35 cm sheath into the common iliac artery on the left side. Selective cannulation of the left internal iliac artery was  performed. An  angiogram of the left internal iliac artery was then performed from the glide catheter which was placed into the proximal portion of this artery. Again no active extravasation identified, however numerous small vessels seen scattered throughout the pelvis from the anterior division. The anterior division of the internal iliac artery on the left side was then cannulated using standard catheter and guidewire technique with the glide catheter and a Glidewire. Once the catheter was in position within the distal anterior division a repeat angiogram was performed with only 1 region suggestive of active bleeding and the rest with angiogenesis from pelvic radiation. The catheter was then flushed and Gel-Foam slurry was made at the table and slowly injected through the catheter under fluoroscopic guidance for adequacy of Gel-Foam embolization. After embolization, Gel-Foam was then cleared from the catheter with the guidewire and then slow gentle flushing. Final image demonstrates slow injection of contrast and a static contrast column indicating satisfactory Gel-Foam embolization of the entire anterior division. The sheath was then partially withdrawn to the right common iliac artery and the guidewire was reversed in order to create a Waltman loop and advanced the glide catheter into the abdominal aorta. The guidewire was again reversed into its normal position and the catheter was used to selectively cannulate the right internal iliac artery. With the glide catheter tip in the right internal iliac artery, a selective angiogram of this vessel was performed. No significant anterior division identified. The posterior division does demonstrate a few arterial branches filling the hemipelvis. Multiple attempts using coaxial technique was attempted to fill the small vessels in the right hemipelvis potentially contributing to the colonic hemorrhage without success. Using standard catheter and guidewire technique,  catheter and guidewire were advanced into the distal abdominal aorta. And aortic angiogram was then performed in order to locate the inferior mesenteric artery and also scout for potential medial sacral artery. The IMA was cannulated using a Sim catheter and Glidewire. A dedicated inferior mesenteric angiogram was then performed. No identification of any active extravasation. No significant filling of the rectal arteries. Coaxial technique was used to advance the microcatheter and microwire into the superior rectal artery and a dedicated superior rectal artery angiogram was performed. This angiogram was performed separately secondary to abrupt truncation of this vessel during the initial IMA angiogram. With the microcatheter in the superior rectal artery, no significant filling of the rectum identified. Sheath injection of the right common femoral artery was performed in order to evaluate access site prior to deployment of the Angio-Seal device. Angio-Seal was used for closure without complication. IMPRESSION: 1. Successful Gel-Foam embolization of the anterior division of the left internal iliac artery. No active extravasation identified, however embolization of the pelvic arterial anatomy from this location seem justified based on CT findings and angiogram in order to decrease the likelihood of active arterial bleed. 2. Attempted access to the right internal iliac artery anterior division was unsuccessful. No active extravasation and overall decreased arterial filling compared to the left. 3. IMA and superior rectal artery angiogram fail to demonstrate any active extravasation. Electronically Signed   By: Cordella Banner   On: 06/14/2024 11:01    Medications:  [START ON 06/16/2024] ceFEPime  (MAXIPIME ) IV     dextrose      [START ON 06/17/2024] vancomycin       (feeding supplement) PROSource Plus  30 mL Oral BID BM   Chlorhexidine  Gluconate Cloth  6 each Topical Q0600   darbepoetin (ARANESP ) injection -  DIALYSIS  100 mcg Subcutaneous Q Mon-1800  dextrose        Gerhardt's butt cream   Topical QID   labetalol   10 mg Intravenous Q6H   sodium chloride  flush  10-40 mL Intracatheter Q12H    Dialysis Orders: MWF [facility]  3.5hrs, 400/500, EDW 53kg, 2K/2.5Ca bath, AVG, heparin  6000 unit bolus + 400u/hr pump - Calcitriol  1.35mcg with HD - Sensipar 60mg  with HD - Micera 75mcg Q 4 weeks - per records, dose just ordered (not given) - Binder: Calcium  Acetate 667mh TID with meals   Assessment/Plan: Colo-uterine fistula: per abd CT. Initial plans for outpt management; Surgery/GI consulted 9/30 for new onset rectal/vaginal bleeding from fistula. Subsequently found to have large rectal mass suggesting malignancy; biopsy non diagnostic; Required IR embolization for continued bleeding. GYN oncology consulted.  No plans for surgery due to comorbidities and overall status.  Surgery team agrees patient high risk for morbility with surgery. Palliative care consulting. Family meeting pending.  C-diff colitis: s/p course of po vancomycin . Cefepime  and Vancomycin  restarted today d/t rising WBC.  ESRD: on HD MWF. Next HD on 10/6.  HTN/volume:  BP remains elevated. Continue UF with HD as able. On IV meds due to somnolence.  Anemia of ESRD + ABLA.  Hgb 10.6   s/p 4 u prbcs this admission, last 10/2.  Continue Aranesp  q Monday this admit. 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 Dementia Malnutrition: NG tube placed, TF started 9/10 - stopped 9/14 (exacerbated diarrhea), NG tube now out.  GOC: Palliative care is following and family has been wanting full scope of care. Technically is tolerating HD but doubt contributing to QOL. With new finding of malignancy need to reassess further GOC.    Manuelita Labella, PA-C [facility] 06/15/2024,2:43 PM  LOS: 30 days

## 2024-06-15 NOTE — Assessment & Plan Note (Signed)
 IR previously consulted, not enough fluid to tap.

## 2024-06-15 NOTE — Progress Notes (Signed)
 Hypoglycemic Event  CBG: 64  Treatment: D50 25 mL (12.5 gm)  Symptoms: None  Follow-up CBG: Time:1420 CBG Result:139  Possible Reasons for Event: Inadequate meal intake  Comments/MD notified:YES    Tracy Jennings [place]

## 2024-06-15 NOTE — Assessment & Plan Note (Signed)
-   Hold aspirin  and Lipitor in setting of poor oral intake

## 2024-06-15 NOTE — Assessment & Plan Note (Signed)
 BP remains elevated.  Poor alertness and Poor PO intake limit home oral meds - Continue scheduled labetalol  - Attempt to resume losartan , spironolactone  - Hold Coreg , diltiazem  - Increase clonidine  patch

## 2024-06-15 NOTE — Progress Notes (Signed)
 Pharmacy Antibiotic Note  Tracy Jennings is a 68 y.o. female admitted on 05/16/2024 with obstructing, inflammatory lesion in the rectum and rising WBC.  Pharmacy has been consulted for vancomycin  and cefepime  dosing. Patient gets dialyzed MWF, last 10/3.   Plan: Cefepime  1g q24hr Vancomycin  750mg  x1 then 500mg  MWF post HD  Monitor cultures, clinical status, renal function, vancomycin  level Narrow abx as able and f/u duration  Height: 5' 3 (160 cm) Weight: 52.2 kg (115 lb 1.3 oz) IBW/kg (Calculated) : 52.4  Temp (24hrs), Avg:98.4 F (36.9 C), Min:97.5 F (36.4 C), Max:99.2 F (37.3 C)  Recent Labs  Lab 06/11/24 0028 06/11/24 2150 06/13/24 0355 06/13/24 1811 06/14/24 0922 06/15/24 0500  WBC 14.3* 16.9* 17.2* 21.9*  --  24.2*  CREATININE 5.43*  --  4.76*  --  5.29* 3.53*    Estimated Creatinine Clearance: 12.9 mL/min (A) (by C-G formula based on SCr of 3.53 mg/dL (H)).    Allergies  Allergen Reactions   Amlodipine  Swelling    Knees down to ankles Knees down to ankles   Hctz [Hydrochlorothiazide]     pancreatitis   Heparin  Other (See Comments)    Hx  HIT,   Pt reports cardiac arrest when given heparin    Lmw Heparin      Reports cardiac arrest when given heparin    Other Other (See Comments)    Reports cardiac arrest when given during surgery Reports cardiac arrest when given during surgery Reports cardiac arrest when given during surgery    Sulfa Antibiotics Rash   Lactose Diarrhea    Per family report   Dairy Aid [Tilactase]     Runny nose   Hydralazine      Nausea and rash    Antimicrobials this admission: Vanc  10/4 >>  cefe 10/4 >>   Dose adjustments this admission: N/a  Microbiology results: none  Thank you for allowing pharmacy to be a part of this patient's care.  Jinnie Door, PharmD, BCPS, BCCP Clinical Pharmacist  Please check AMION for all [facility] Pharmacy phone numbers After 10:00 PM, call Main Pharmacy [phone]

## 2024-06-15 NOTE — Assessment & Plan Note (Signed)
 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  In 2023, GYN oncology notes imply that she had some thickening of the colon and pelvic lymphadenopathy and a PET scan and follow-up imaging was planned, but does not appear to ever been completed and unfortunately no follow-up since then.  Biopsy inconclusive, but strongly clinically suspect cancer.  Continues to struffle to stay awake, to eat, and has a high symptom burden with stool from vagina, abdominal pain.  Yesterday's dialysis was halted early.  Shared frankly with family Sunday and Monday that there are no curative options left, and each week in the hospital has seen her weaker and weaker.  Family wish to observe another dialysis session.  Paitent no longer able to participate in goals of care discussions, decision makers are her son [name] and sister [name]

## 2024-06-15 NOTE — Progress Notes (Signed)
 Administered 10mg  labetalol  iv for bp 185/65 (98). Rechecked bp 178/69 (100)

## 2024-06-15 NOTE — Assessment & Plan Note (Signed)
 Resolved

## 2024-06-15 NOTE — Assessment & Plan Note (Signed)
 She has had life-threatening bleeding. Risk of anticoagulation outweighs benefit at this time - Monitor arm clinically

## 2024-06-15 NOTE — Assessment & Plan Note (Signed)
 Glucose low due to FTT - Stop SSI - Accuchecks once daily

## 2024-06-15 NOTE — Assessment & Plan Note (Signed)
-   Consult Nephrology for HD

## 2024-06-15 NOTE — Assessment & Plan Note (Addendum)
 Acute colo-uterine bleeding from mass  Biopsy with debris only, no cellular material.  Immunohistochemical stains still pending.  Had bleeding after flex sig biopsy from mass, now mostly resolved.  WBC rising - Hold anticoagulation - Follow immunohistochemistry - Start antibiotics while monitoring WBC - Obtain blood cultures - Consult Palliative Care

## 2024-06-15 NOTE — Progress Notes (Signed)
 Progress Note   Patient: Tracy Jennings FMW:[id number] DOB: 1955-10-06 DOA: 05/16/2024     30 DOS: the patient was seen and examined on 06/15/2024 at 7:57AM      Brief hospital course: 68 y.o. 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  The initial plan after general surgery evaluation was discharged with outpatient follow-up for surgical resection of fistula, but it became clear that the patient's functional status had declined so much that she was too weak to sit up in a chair for dialysis, and her hospitalization was prolonged.  She subsequently then developed bleeding from her colo-uterine fistula, and GI were consulted.  On flexible sigmoidoscopy, she was found to have a large malignant appearing mass at the site of the fistula, and this developed bleeding post sigmoidoscopy requiring embolization.     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  Immunohistochemical stains still pending.  Had bleeding after flex sig biopsy from mass, now mostly resolved.  WBC rising - Hold anticoagulation - Follow immunohistochemistry - Start antibiotics while monitoring WBC - Obtain blood cultures - Consult Palliative Care    History of cervical clear cell adenoCA Masslike consolidation of the left lung Pelvic lymphadenopathy As noted previously, the patient underwent chemotherapy and radiation as well as brachytherapy in 2021 for her unusual cervical cancer.  In 2023, GYN oncology notes imply that she had some thickening of the colon and pelvic lymphadenopathy and a PET scan and follow-up imaging was planned, but does not appear to ever been completed and I see no follow-up since then.   Although biopsy is pending, the appearance of the mass in the colon  is strongly suspected to be malignant by GYN oncology and gastroenterology.   - Have discussed with [facility], family meeting is pending    Acute metabolic encephalopathy Dementia At baseline, patient interactive and lives at home, familiar with surroundings but with memory loss.  Here, she has been alert but oriented only to self for most of hospital stay.  Since flex sig on 10/2, she is less responsive, somnolent, due to blood loss, malnutrition primarily.  No focal signs but exam limited due to somnolence. - Standard delirium precautions: blinds open and lights on during day, TV off, minimize interruptions at night, glasses/hearing aids, PT/OT, avoiding Beers list medications   - SLP consult - Meds to IV, advance as mentation improves - Hold iron, vit B12, famotidine , prosource/Boost until mentation improves   Acute blood loss anemia due to fistula on chronic anemia of CKD Baseline 9-10.  Transfused 2u PRBC 10/1 and another 2u post-embo on 10/2 Hgb up to 10 today.  Oozing has slowed - Trend hemoglobin    ESRD (end stage renal disease) (HCC) - Consult Nephrology for HD  C. difficile colitis Resolved  Acute right internal jugular DVT - Risk of anticoagulation outweighs benefit at this time - Monitor for resumption pending goals of care  PVD (peripheral vascular disease) - Hold aspirin  and Lipitor in setting of poor oral intake  Pleural effusion IR previously consulted, not enough fluid to tap.  Protein-calorie malnutrition, severe Failure to thrive Recently failed trial of tube feeds due to worsening of diarrhea.   Not a candidate for TPN. - Consult Palliative care - Consult Dietitian  Essential hypertension BP elevated again -  Continue Coreg , diltiazem , losartan  - Hold clonidine , Imdur  for now  Type 2 diabetes mellitus with other specified complication (HCC) Glucose controlled - Continue sliding scale corrections          Subjective: No change  overnight.  Still somnolent, poorly responsive, although does open eyes, respond to questions briefly, does not cooperate with exam.  No fever, no hypotension. No respiraotry symptoms.  Biopsy nondiagnostic.  GI have signed off.  Gyn Onc and Surgery obviously will not do surgery given her condition.  This mass will not go away by itself, and no medical therapies will help either.  Pallitive conversations pending.     Physical Exam: BP (!) 157/64 (BP Location: Right Arm)   Pulse 81   Temp 98.7 F (37.1 C) (Axillary)   Resp 20   Ht 5' 3 (1.6 m)   Wt 52.2 kg   LMP  (LMP Unknown)   SpO2 94%   BMI 20.39 kg/m   Frail elderly female, curled up in bed, opens eyes to voice, makes eye contact, then closes them again and does not cooperate further with exam.  Does not answer orientation questions, does not answer any questions. RRR, no murmurs, no pitting in the extremities, mild nonpitting edema throughout Respiratory rate seems normal, lung sounds very diminished, does not cooperate with exam, I do not hear rales or wheezes Abdomen soft, she does not grimace to palpation or guarding She opens eyes and makes eye contact, then closes them again.  She resists movement of the left arm, but does not move in such a way that I can test leg strength or right arm movement.  Her voice does not sound dysarthric, but she refuses to answer questions.  Overall just appears very somnolent.    Data Reviewed: Basic metabolic panel shows normal sodium, potassium.  BUN slightly elevated, creatinine at baseline CBC shows rising white count, hemoglobin stable at 10.6 Discussed case with GYN oncology.    Family Communication: None present    Disposition: Status is: Inpatient         Author: [name], MD 06/15/2024 10:11 AM  For on call review [email or web address].

## 2024-06-15 NOTE — Progress Notes (Signed)
 Patient ID: Tracy Jennings, female   DOB: [birth year], 68 y.o.   MRN: [id number] 2 Days Post-Op    Subjective: Denies bleeding, reports some abdominal pain ROS negative except as listed above. Objective: Vital signs in last 24 hours: Temp:  [97.5 F (36.4 C)-99.2 F (37.3 C)] 98.7 F (37.1 C) (10/04 0320) Pulse Rate:  [13-83] 81 (10/04 0400) Resp:  [10-20] 20 (10/04 0800) BP: (141-197)/(59-81) 157/64 (10/04 0800) SpO2:  [92 %-100 %] 95 % (10/04 0400) Weight:  [52.2 kg-53.2 kg] 52.2 kg (10/03 1224) Last BM Date : 06/14/24  Intake/Output from previous day: 10/03 0701 - 10/04 0700 In: -  Out: 1000  Intake/Output this shift: No intake/output data recorded.  General appearance: cooperative Resp: clear to auscultation bilaterally GI: soft, nontender, ND  Lab Results: CBC  Recent Labs    06/13/24 1811 06/14/24 0500 06/14/24 1456 06/15/24 0500  WBC 21.9*  --   --  24.2*  HGB 6.1*   < > 10.7* 10.6*  HCT 19.6*   < > 31.9* 32.1*  PLT 163  --   --  203   < > = values in this interval not displayed.   BMET Recent Labs    06/14/24 0922 06/15/24 0500  NA 136 136  K 5.2* 4.0  CL 100 97*  CO2 21* 25  GLUCOSE 111* 72  BUN 49* 28*  CREATININE 5.29* 3.53*  CALCIUM  8.5* 8.5*   PT/INR No results for input(s): LABPROT, INR in the last 72 hours. ABG Recent Labs    06/13/24 1302  HCO3 28.5*    Studies/Results:   Anti-infectives: Anti-infectives (From admission, onward)    Start     Dose/Rate Route Frequency Ordered Stop   06/17/24 1800  vancomycin  (VANCOREADY) IVPB 500 mg/100 mL        500 mg 100 mL/hr over 60 Minutes Intravenous Every M-W-F (Hemodialysis) 06/15/24 0738     06/16/24 1800  ceFEPIme  (MAXIPIME ) 1 g in sodium chloride  0.9 % 100 mL IVPB        1 g 200 mL/hr over 30 Minutes Intravenous Every evening 06/15/24 0736     06/15/24 0830  vancomycin  (VANCOREADY) IVPB 750 mg/150 mL        750 mg 150 mL/hr over 60 Minutes Intravenous  Once 06/15/24 0735      06/15/24 0745  ceFEPIme  (MAXIPIME ) 1 g in sodium chloride  0.9 % 100 mL IVPB        1 g 200 mL/hr over 30 Minutes Intravenous Once 06/15/24 0735     06/04/24 1215  vancomycin  (VANCOCIN ) capsule 125 mg        125 mg Oral Daily 06/04/24 1116 06/05/24 0621   05/27/24 1000  vancomycin  (VANCOCIN ) capsule 125 mg        125 mg Oral Daily 05/21/24 1019 06/04/24 0959   05/20/24 1530  amoxicillin -clavulanate (AUGMENTIN ) 500-125 MG per tablet 1 tablet        1 tablet Oral Every 12 hours 05/20/24 1519 05/29/24 2152   05/17/24 0100  fidaxomicin  (DIFICID ) tablet 200 mg        200 mg Oral 2 times daily 05/17/24 0003 05/26/24 1037   05/17/24 0100  metroNIDAZOLE  (FLAGYL ) IVPB 500 mg  Status:  Discontinued        500 mg 100 mL/hr over 60 Minutes Intravenous Every 12 hours 05/17/24 0003 05/20/24 1519       Assessment/Plan: 68 yo female with C difficile colitis admitted last month and tx w/ Vanc and  Dificid  as well as imaging evidence of a colouterine fistula and now with near obstructing infiltrative mass 16cm from anal verge, unclear if this is GYN related or new malignancy, GI bleed - Hold anticoagulation.  - IR embo 10/2, hgb 10.6 -biopsies pending. -agree with Dr. Viktoria that surgical intervention would be high risk and morbid for this patient with numerous co-morbidities. -agree with continued palliative involvement -no immediate surgical plans at this time, but will continue to follow and monitor given bleeding and obstructive nature.  FEN - renal diet VTE - SCDs, hold Eliquis  for anemia  ID - Seen by ID 9/30. No current abx.   - Per TRH -  Hx C. Diff - Per GI and ID. Imodium. No current abx.  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

## 2024-06-15 NOTE — Evaluation (Signed)
 Clinical/Bedside Swallow Evaluation Patient Details  Name: Tracy Jennings MRN: [id number] Date of Birth: October 29, 1955  Today's Date: 06/15/2024 Time: SLP Start Time (ACUTE ONLY): 1030 SLP Stop Time (ACUTE ONLY): 1048 SLP Time Calculation (min) (ACUTE ONLY): 18 min  Past Medical History:  Past Medical History:  Diagnosis Date   Anemia 04/2018   low iron. to be started on supplements   Cervical cancer (HCC)    CKD (chronic kidney disease)    Stage IV   Complication of anesthesia    receceived too much anesthesia, that she was in coma for a couple days    COVID-19 virus detected 10/28/2019   Diabetes mellitus without complication (HCC)    type II   ESRD (end stage renal disease) (HCC)    Heart murmur    followed as a child only   HSIL (high grade squamous intraepithelial lesion) on Pap smear of cervix    Hyperlipidemia associated with type 2 diabetes mellitus (HCC)    Hypertension    Pancreatitis    Peripheral vascular disease    Past Surgical History:  Past Surgical History:  Procedure Laterality Date   AMPUTATION TOE Left 2013   2nd toe. tip of toe (toe nail was infected)   AV FISTULA PLACEMENT Left 05/11/2018   Procedure: ARTERIOVENOUS (AV) FISTULA CREATION;  Surgeon: Jama Cordella MATSU, MD;  Location: [facility];  Service: Vascular;  Laterality: Left;   CATARACT EXTRACTION     CERVICAL CONIZATION W/BX N/A 04/08/2020   Procedure: CONIZATION CERVIX WITH BIOPSY;  Surgeon: Mancil Barter, MD;  Location: [facility];  Service: Gynecology;  Laterality: N/A;   CHOLECYSTECTOMY  2014   COLONOSCOPY     COLONOSCOPY WITH PROPOFOL  N/A 01/10/2018   Procedure: COLONOSCOPY WITH PROPOFOL ;  Surgeon: Toledo, Ladell POUR, MD;  Location: [facility];  Service: Gastroenterology;  Laterality: N/A;   DIALYSIS/PERMA CATHETER INSERTION N/A 05/21/2018   Procedure: DIALYSIS/PERMA CATHETER INSERTION;  Surgeon: Marea Selinda RAMAN, MD;  Location: [facility];  Service: Cardiovascular;  Laterality: N/A;    DIALYSIS/PERMA CATHETER INSERTION N/A 07/14/2020   Procedure: DIALYSIS/PERMA CATHETER INSERTION;  Surgeon: Marea Selinda RAMAN, MD;  Location: [facility];  Service: Cardiovascular;  Laterality: N/A;   DIALYSIS/PERMA CATHETER REMOVAL N/A 04/11/2019   Procedure: DIALYSIS/PERMA CATHETER REMOVAL;  Surgeon: Marea Selinda RAMAN, MD;  Location: [facility];  Service: Cardiovascular;  Laterality: N/A;   EYE SURGERY Bilateral 2018   cataract extractions   FLEXIBLE SIGMOIDOSCOPY N/A 06/13/2024   Procedure: SIGMOIDOSCOPY, FLEXIBLE;  Surgeon: San Sandor GAILS, DO;  Location: [facility];  Service: Gastroenterology;  Laterality: N/A;   IR ANGIOGRAM PELVIS SELECTIVE OR SUPRASELECTIVE  06/13/2024   IR ANGIOGRAM PELVIS SELECTIVE OR SUPRASELECTIVE  06/13/2024   IR ANGIOGRAM SELECTIVE EACH ADDITIONAL VESSEL  06/13/2024   IR ANGIOGRAM VISCERAL SELECTIVE  06/13/2024   IR EMBO ART  VEN HEMORR LYMPH EXTRAV  INC GUIDE ROADMAPPING  06/13/2024   IR IMAGING GUIDED PORT INSERTION  06/05/2020   IR US  GUIDE VASC ACCESS RIGHT  06/13/2024   THROMBECTOMY W/ EMBOLECTOMY  05/11/2018   Procedure: THROMBECTOMY ARTERIOVENOUS FISTULA;  Surgeon: Jama Cordella MATSU, MD;  Location: [facility];  Service: Vascular;;   TUBAL LIGATION  1984   HPI:  Patient is a 68 y.o. 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 She developed  bleeding from colo-uterine fistula and GI was consulted. GI performed  flexible sigmoidoscopy on 10/2, she was found to have a large malignant appearing mass at the site of the fistula, and this developed bleeding post sigmoidoscopy requiring embolization. She underwent embolization of anterior branch of left internal iliac artery on 10/2. SLP swallow evaluation ordered 10/3.    Assessment / Plan / Recommendation  Clinical  Impression  Patient did not exhibit any overt s/s of aspiration or swallow initiation delays with limited PO's (a couple small sips of water ) tested. She did endorse pain after swallowing but when asked to clarify, she only said the pain was in my swallow. She refused any other PO's despite encouragement from brother and SLP. She continued to endorse pain which she indicated was in her stomach but also she would grimace in pain when HOB raised. Per her brother, patient had been on a pureed versus cut-up diet. SLP reviewed order history and found that she had been placed on Dys 2 (minced) solids, thin liquids diet on 06/13/24 and this was changed to regular texture solids (renal diet), thin liquids on 10/2. SLP reviewed recent past BSE's (7/16 and 03/31/24) which both indicated patient's swallow appeared [facility] with liquids however she refused and/or required maximal encouragement to try any solids. SLP will follow for diet toleration and to assist with determining safest and least restrictive PO diet consistency. SLP Visit Diagnosis: Dysphagia, unspecified (R13.10)    Aspiration Risk  Mild aspiration risk    Diet Recommendation Other (Comment);Thin liquid (unable to determine toleration of solids)    Liquid Administration via: Straw;Cup Medication Administration: Other (Comment) (as tolerated) Supervision: Full supervision/cueing for compensatory strategies;Staff to assist with self feeding Compensations: Slow rate;Small sips/bites Postural Changes: Seated upright at 90 degrees    Other  Recommendations Oral Care Recommendations: Oral care BID;Oral care before and after PO;Staff/trained caregiver to provide oral care     Assistance Recommended at Discharge    Functional Status Assessment Patient has had a recent decline in their functional status and/or demonstrates limited ability to make significant improvements in function in a reasonable and predictable amount of time  Frequency and Duration  min 1 x/week  1 week       Prognosis Prognosis for improved oropharyngeal function: Guarded Barriers to Reach Goals: Cognitive deficits      Swallow Study   General Date of Onset: 06/14/24 HPI: Patient is a 68 y.o. 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 She developed bleeding from colo-uterine fistula and GI was consulted. GI performed  flexible sigmoidoscopy on 10/2, she was found to have a large malignant appearing mass at the site of the fistula, and this developed bleeding post sigmoidoscopy requiring embolization. She underwent embolization of anterior branch of left internal iliac artery on 10/2. SLP swallow evaluation ordered 10/3. Type of Study: Bedside Swallow Evaluation Previous Swallow Assessment: BSE July 2025 Diet Prior to this Study: Regular;Thin liquids (Level 0) Temperature Spikes Noted: No Respiratory Status: Room air History of Recent Intubation: Yes Total duration of intubation (days):  (for surgery only on 06/13/24) Behavior/Cognition: Lethargic/Drowsy;Requires cueing Oral Cavity Assessment: Dry Oral Care Completed by SLP: No Self-Feeding Abilities: Total assist Patient Positioning: Partially reclined Baseline Vocal Quality: Low vocal intensity Volitional Cough: Cognitively unable to elicit Volitional Swallow: Unable to elicit    Oral/Motor/Sensory  Function Overall Oral Motor/Sensory Function: Generalized oral weakness   Ice Chips     Thin Liquid Thin Liquid: Within functional limits Presentation: Straw    Nectar Thick     Honey Thick     Puree Puree: Not tested Other Comments: patient refused   Solid     Solid: Not tested Other Comments: patient refused     Norleen IVAR Blase, MA, CCC-SLP Speech Therapy

## 2024-06-15 NOTE — Assessment & Plan Note (Signed)
 Baseline 9-10.  Transfused 2u PRBC 10/1 and another 2u post-embo on 10/2 Hgb up to 10 today.  Oozing has slowed - Trend hemoglobin

## 2024-06-16 DIAGNOSIS — N186 End stage renal disease: Secondary | ICD-10-CM | POA: Diagnosis not present

## 2024-06-16 DIAGNOSIS — D649 Anemia, unspecified: Secondary | ICD-10-CM | POA: Diagnosis not present

## 2024-06-16 DIAGNOSIS — K6389 Other specified diseases of intestine: Secondary | ICD-10-CM

## 2024-06-16 DIAGNOSIS — Z515 Encounter for palliative care: Secondary | ICD-10-CM | POA: Diagnosis not present

## 2024-06-16 DIAGNOSIS — E43 Unspecified severe protein-calorie malnutrition: Secondary | ICD-10-CM

## 2024-06-16 DIAGNOSIS — G9341 Metabolic encephalopathy: Secondary | ICD-10-CM

## 2024-06-16 DIAGNOSIS — N824 Other female intestinal-genital tract fistulae: Secondary | ICD-10-CM | POA: Diagnosis not present

## 2024-06-16 DIAGNOSIS — F03918 Unspecified dementia, unspecified severity, with other behavioral disturbance: Secondary | ICD-10-CM | POA: Diagnosis not present

## 2024-06-16 LAB — COMPREHENSIVE METABOLIC PANEL WITH GFR
ALT: 10 U/L (ref 0–44)
AST: 15 U/L (ref 15–41)
Albumin: 1.9 g/dL — ABNORMAL LOW (ref 3.5–5.0)
Alkaline Phosphatase: 55 U/L (ref 38–126)
Anion gap: 15 (ref 5–15)
BUN: 36 mg/dL — ABNORMAL HIGH (ref 8–23)
CO2: 24 mmol/L (ref 22–32)
Calcium: 8.1 mg/dL — ABNORMAL LOW (ref 8.9–10.3)
Chloride: 93 mmol/L — ABNORMAL LOW (ref 98–111)
Creatinine, Ser: 4.29 mg/dL — ABNORMAL HIGH (ref 0.44–1.00)
GFR, Estimated: 11 mL/min — ABNORMAL LOW (ref >60)
Glucose, Bld: 145 mg/dL — ABNORMAL HIGH (ref 70–99)
Potassium: 3.7 mmol/L (ref 3.5–5.1)
Sodium: 132 mmol/L — ABNORMAL LOW (ref 135–145)
Total Bilirubin: 0.7 mg/dL (ref 0.0–1.2)
Total Protein: 5.4 g/dL — ABNORMAL LOW (ref 6.5–8.1)

## 2024-06-16 LAB — CBC
HCT: 30.6 % — ABNORMAL LOW (ref 36.0–46.0)
Hemoglobin: 10 g/dL — ABNORMAL LOW (ref 12.0–15.0)
MCH: 29.9 pg (ref 26.0–34.0)
MCHC: 32.7 g/dL (ref 30.0–36.0)
MCV: 91.6 fL (ref 80.0–100.0)
Platelets: 199 K/uL (ref 150–400)
RBC: 3.34 MIL/uL — ABNORMAL LOW (ref 3.87–5.11)
RDW: 18.6 % — ABNORMAL HIGH (ref 11.5–15.5)
WBC: 22.5 K/uL — ABNORMAL HIGH (ref 4.0–10.5)
nRBC: 0.1 % (ref 0.0–0.2)

## 2024-06-16 LAB — GLUCOSE, CAPILLARY
Glucose-Capillary: 69 mg/dL — ABNORMAL LOW (ref 70–99)
Glucose-Capillary: 119 mg/dL — ABNORMAL HIGH (ref 70–99)
Glucose-Capillary: 76 mg/dL (ref 70–99)
Glucose-Capillary: 96 mg/dL (ref 70–99)
Glucose-Capillary: 89 mg/dL (ref 70–99)
Glucose-Capillary: 93 mg/dL (ref 70–99)

## 2024-06-16 MED ORDER — CHLORHEXIDINE GLUCONATE CLOTH 2 % EX PADS
6.0000 | MEDICATED_PAD | Freq: Every day | CUTANEOUS | Status: DC
  Administered 2024-06-17 – 2024-06-18 (×2): 6 via TOPICAL

## 2024-06-16 MED ORDER — CLONIDINE HCL 0.2 MG/24HR TD PTWK
0.2000 mg | MEDICATED_PATCH | TRANSDERMAL | Status: DC
  Administered 2024-06-16: 0.2 mg via TRANSDERMAL
  Filled 2024-06-16: qty 1

## 2024-06-16 MED ORDER — DEXTROSE 50 % IV SOLN
1.0000 | Freq: Once | INTRAVENOUS | Status: AC
  Administered 2024-06-16: 50 mL via INTRAVENOUS
  Filled 2024-06-16: qty 50

## 2024-06-16 NOTE — Progress Notes (Signed)
  [facility] Progress Note   Subjective:   Patient seen and examined at bedside. No acute events overnight.  Remains somnolent  Objective Vitals:   06/16/24 1015 06/16/24 1030 06/16/24 1113 06/16/24 1130  BP: (!) 159/70 (!) 152/69 (!) 182/74 (!) 147/91  Pulse: 84 83 88 85  Resp: 16 17 19  (!) 21  Temp:   98.2 F (36.8 C)   TempSrc:   Oral   SpO2: 95% 91% 100% 99%  Weight:      Height:       Physical Exam General:somnolent, thin, frail female in NAD Heart:RRR Lungs:CTAB, nml WOB on RA Abdomen:soft, non distended Extremities:no LE edema Dialysis Access: LU AVG +b/t   Filed Weights   06/14/24 0900 06/14/24 1219 06/14/24 1224  Weight: 53.2 kg 52.2 kg 52.2 kg    Intake/Output Summary (Last 24 hours) at 06/16/2024 1234 Last data filed at 06/16/2024 1113 Gross per 24 hour  Intake 335.54 ml  Output --  Net 335.54 ml    Additional Objective Labs: Basic Metabolic Panel: Recent Labs  Lab 06/11/24 0028 06/13/24 0355 06/14/24 0922 06/15/24 0500 06/16/24 0500  NA 139   < > 136 136 132*  K 4.7   < > 5.2* 4.0 3.7  CL 100   < > 100 97* 93*  CO2 27   < > 21* 25 24  GLUCOSE 98   < > 111* 72 145*  BUN 46*   < > 49* 28* 36*  CREATININE 5.43*   < > 5.29* 3.53* 4.29*  CALCIUM  8.2*   < > 8.5* 8.5* 8.1*  PHOS 3.9  --   --   --   --    < > = values in this interval not displayed.   Liver Function Tests: Recent Labs  Lab 06/13/24 0355 06/14/24 0922 06/16/24 0500  AST 13* 12* 15  ALT 10 9 10   ALKPHOS 51 43 55  BILITOT 0.5 1.3* 0.7  PROT 5.8* 5.7* 5.4*  ALBUMIN 1.8* 2.2* 1.9*   CBC: Recent Labs  Lab 06/11/24 0028 06/11/24 2150 06/12/24 1544 06/13/24 0355 06/13/24 1302 06/13/24 1811 06/14/24 0500 06/14/24 1456 06/15/24 0500 06/16/24 0500  WBC 14.3* 16.9*  --  17.2*  --  21.9*  --   --  24.2* 22.5*  NEUTROABS 11.9* 14.2*  --   --   --   --   --   --   --   --   HGB 7.1* 6.8*   < > 9.5*   < > 6.1*   < > 10.7* 10.6* 10.0*  HCT 23.5* 22.2*   < >  30.0*   < > 19.6*   < > 31.9* 32.1* 30.6*  MCV 96.7 96.1  --  92.6  --  95.1  --   --  90.2 91.6  PLT 249 234  --  219  --  163  --   --  203 199   < > = values in this interval not displayed.   Blood Culture    Component Value Date/Time   SDES BLOOD RIGHT ARM 06/15/2024 1414   SPECREQUEST  06/15/2024 1414    BOTTLES DRAWN AEROBIC ONLY Blood Culture results may not be optimal due to an inadequate volume of blood received in culture bottles   CULT  06/15/2024 1414    NO GROWTH < 24 HOURS Performed at [facility], [address] [place], [place] [address]    REPTSTATUS PENDING 06/15/2024 1414  CBG: Recent Labs  Lab 06/15/24 2143 06/16/24 0251 06/16/24 0356 06/16/24 0836 06/16/24 1155  GLUCAP 71 69* 119* 76 96    Medications:  ceFEPime  (MAXIPIME ) IV     dextrose  40 mL/hr at 06/15/24 1642   [START ON 06/17/2024] vancomycin       (feeding supplement) PROSource Plus  30 mL Oral BID BM   Chlorhexidine  Gluconate Cloth  6 each Topical Q0600   cloNIDine   0.2 mg Transdermal Q Sun   darbepoetin (ARANESP ) injection - DIALYSIS  100 mcg Subcutaneous Q Mon-1800   Gerhardt's butt cream   Topical QID   labetalol   10 mg Intravenous Q6H   sodium chloride  flush  10-40 mL Intracatheter Q12H    Dialysis Orders: MWF [facility]  3.5hrs, 400/500, EDW 53kg, 2K/2.5Ca bath, AVG, heparin  6000 unit bolus + 400u/hr pump - Calcitriol  1.62mcg with HD - Sensipar 60mg  with HD - Micera 75mcg Q 4 weeks - per records, dose just ordered (not given) - Binder: Calcium  Acetate 667mh TID with meals   Assessment/Plan: Colo-uterine fistula: per abd CT. Initial plans for outpt management; Surgery/GI consulted 9/30 for new onset rectal/vaginal bleeding from fistula. Subsequently found to have large rectal mass suggesting malignancy; biopsy non diagnostic; Required IR embolization for continued bleeding. GYN oncology consulted.  No plans for surgery due to comorbidities and overall status.   Surgery team agrees patient high risk for morbility with surgery. Palliative care consulting. Family meeting pending.  C-diff colitis: s/p course of po vancomycin . Cefepime  and Vancomycin  restarted today d/t rising WBC.  ESRD: on HD MWF. Next HD on 10/6.  HTN/volume:  BP remains elevated. Continue UF with HD as able. On IV meds due to somnolence.  Anemia of ESRD + ABLA.  Hgb 10.6   s/p 4 u prbcs this admission, last 10/2.  Continue Aranesp  q Monday this admit. 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 Dementia Malnutrition: NG tube placed, TF started 9/10 - stopped 9/14 (exacerbated diarrhea), NG tube now out.  GOC: Palliative care is following and family has been wanting full scope of care. Technically is tolerating HD but doubt contributing to QOL. With new finding of malignancy need to reassess further GOC.    Manuelita Labella, PA-C [facility] 06/16/2024,12:34 PM  LOS: 31 days

## 2024-06-16 NOTE — Progress Notes (Addendum)
   06/16/24 1614  Assess: MEWS Score  BP (!) 180/76  MAP (mmHg) 107  Pulse Rate 89  Resp 13  SpO2 100 %  O2 Device Room Air  Assess: MEWS Score  MEWS Temp 0  MEWS Systolic 0  MEWS Pulse 0  MEWS RR 1  MEWS LOC 0  MEWS Score 1  MEWS Score Color Green  Assess: SIRS CRITERIA  SIRS Temperature  0  SIRS Respirations  0  SIRS Pulse 0  SIRS WBC 0  SIRS Score Sum  0   Prn hydralazine  10mg  given

## 2024-06-16 NOTE — IPAL (Signed)
  Interdisciplinary Goals of Care Family Meeting   Date carried out: 06/16/2024  Location of the meeting: Conference room  Member's involved: Physician, Family Member or next of kin, and Palliative care team member  Durable Power of Attorney or acting medical decision maker: Son [name], also sister [name], brother [name], and other brother and sister     Discussion: We discussed goals of care for Tracy Jennings .  We reviewed her medical course to this point, and the current status of repeated recent hospitalizations, being one month in the hospital with persistent inability to take much by mouth, inability to sit up in a chair, and decreased alertness, as well as the new finding of a mass at the site of the colouterine fistula.  I shared the assessment of myself, GI and Gyn-Oncology that clinically this appeared to be cancer despite inconclusive biopsy, but that she was unfortunately too weak to safely undergo en bloc resection.  With her current functional status, she is also not able to survive chemotherapy, were we to repeat biopsy (which would be life-threatening, given her bleeding after the last biopsy).  Family will take time to process and formulate questions.   Code status:   Code Status: Full Code   Disposition: Continue current acute care  Time spent for the meeting: 45 minutes    [name], MD  06/16/2024, 2:02 PM

## 2024-06-16 NOTE — Plan of Care (Signed)

## 2024-06-16 NOTE — Progress Notes (Signed)
 Daily Progress Note   Patient Name: Tracy Jennings       Date: 06/16/2024 DOB: 21-Oct-1955  Age: 68 y.o. MRN#: [id number] Attending Physician: Jonel Lonni SQUIBB, * Primary Care Physician: Zachary Idelia LABOR, MD Admit Date: 05/16/2024  Reason for Consultation/Follow-up: Establishing goals of care  Subjective: Medical records reviewed including progress notes, labs and imaging. Discussed with Dr. Jonel. Patient assessed at the bedside and relocated to [facility] conference room for scheduled family meeting at 1pm with patient's 3 siblings and son while other family members remained present with patient for support.  Provided palliative support as family was update on poor long-term prognosis and lack of treatment options for new findings of likely cancerous mass at the colouterine fistula. They now understand that she is unfortunately approaching end of life despite all available medical interventions. I also shared my concern for patient's ability to tolerate HD in sitting position, which would be required to leave the hospital setting.   Created space and opportunity for family's thoughts and feelings on patient's current illness. They share that they have never discussion patient's EOL preferences with her, though some of them might have better luck than others if they were to attempt to explore with her moving forward. They are not sure where she would want to spend her final time. We discussed the importance of ensuring patient's peace, comfort and dignity for as long as she might have, no matter where she is cared for. Family grateful to hear that she can be transferred back to 6N after monitoring the past few days. They are also grateful for additional time to reflect and process this difficult information before considering next steps and anticipatory  care needs in more detail.    Questions and concerns addressed. PMT will continue to support holistically.   Length of Stay: 31   Physical Exam Vitals and nursing note reviewed.  Constitutional:      General: She is awake. She is not in acute distress.    Appearance: She is ill-appearing.  HENT:     Head: Normocephalic.  Cardiovascular:     Rate and Rhythm: Normal rate.  Pulmonary:     Effort: Pulmonary effort is normal.  Neurological:     Mental Status: She is alert.            Vital Signs: BP (!) 187/72 (BP Location: Right Arm)   Pulse 85   Temp 97.9 F (36.6 C) (Oral)   Resp 18   Ht 5' 3 (1.6 m)   Wt 52.2 kg   LMP  (LMP Unknown)   SpO2 99%   BMI 20.39 kg/m  SpO2: SpO2: 99 % O2 Device: O2 Device: Room Air O2 Flow Rate:        Palliative Assessment/Data: 30% (on tube feeds)   Palliative Care Assessment & Plan   Patient Profile: 69 y.o. female  with past medical history of cognitive impairment, ESRD on HD MWF, cervical cancer, HTN, HLD, anemia and recent hospitalizations from 6/24-7/22 for hypertensive urgency and delirium, and 8/17-8/24 for C. difficile colitis for which she was treated with p.o. vancomycin   admitted on 05/16/2024 with weakness, nausea, diarrhea and abdominal discomfort. CT abdomen and pelvis showed  circumferential wall thickening of rectosigmoid colon suspicious for colitis, Colo uterine fistula, increased bilateral pleural effusions, left > right and area of rounded density in anterior LLL measuring 3.1 cm, and progressive anasarca.  General surgery at [facility] was consulted and recommended higher level of care and the ER physician discussed with Dr. Dasie general surgeon at [facility] who agreed to see patient in consult. Patient was started on Dificid  and Flagyl  for C. difficile colitis.  Evaluated by general surgery - urgent surgical intervention is not indicated. PMT consulted to discuss GOC.    Assessment: Goals of care  conversation FTT Dementia ESRD on HD Acute metabolic encephalopathy superimposed on dementia Colouterine fistula Concern for malignant mass at colouterine fistula    Recommendations/Plan: Continue current care plan  Patient's family will continue to process today's updates regarding likely cancerous mass and poor prognosis without treatment options Ongoing GOC discussions Psychosocial and emotional support provided PMT will continue to follow and support   Prognosis:  Poor long-term prognosis  Discharge Planning: To Be Determined   Care plan was discussed with Patient's son, patient's 3 siblings, MD          Mickle SHAUNNA Fell, PA-C  Palliative Medicine Team Team phone # [phone]  Thank you for allowing the Palliative Medicine Team to assist in the care of this patient. Please utilize secure chat with additional questions, if there is no response within 30 minutes please call the above phone number.  Palliative Medicine Team providers are available by phone from 7am to 7pm daily and can be reached through the team cell phone.  Should this patient require assistance outside of these hours, please call the patient's attending physician.     Time Total: 65  Visit consisted of counseling and education dealing with the complex and emotionally intense issues of symptom management and palliative care in the setting of serious and potentially life-threatening illness. Greater than 50% of this time was spent counseling and coordinating care related to the above assessment and plan.  Personally spent 65 minutes in patient care including extensive chart review (labs, imaging, progress/consult notes, vital signs), medically appropraite exam, discussed with treatment team, education to patient, family, and staff, documenting clinical information, medication review and management, coordination of care, and available advanced directive documents.

## 2024-06-16 NOTE — Progress Notes (Signed)
 3 Days Post-Op   Subjective/Chief Complaint: Complains of abd pain   Objective: Vital signs in last 24 hours: Temp:  [97.6 F (36.4 C)-98.9 F (37.2 C)] 97.9 F (36.6 C) (10/05 0812) Pulse Rate:  [80-88] 85 (10/05 0812) Resp:  [11-21] 18 (10/05 0812) BP: (145-197)/(63-90) 187/72 (10/05 0812) SpO2:  [91 %-100 %] 99 % (10/05 0812) Last BM Date : 06/16/24  Intake/Output from previous day: 10/04 0701 - 10/05 0700 In: 155.5 [I.V.:5.5; IV Piggyback:150] Out: -  Intake/Output this shift: No intake/output data recorded.  General appearance: appears older than stated age and slowed mentation Resp: clear to auscultation bilaterally Cardio: regular rate and rhythm GI: soft, moderate tenderness  Lab Results:  Recent Labs    06/15/24 0500 06/16/24 0500  WBC 24.2* 22.5*  HGB 10.6* 10.0*  HCT 32.1* 30.6*  PLT 203 199   BMET Recent Labs    06/15/24 0500 06/16/24 0500  NA 136 132*  K 4.0 3.7  CL 97* 93*  CO2 25 24  GLUCOSE 72 145*  BUN 28* 36*  CREATININE 3.53* 4.29*  CALCIUM  8.5* 8.1*   PT/INR No results for input(s): LABPROT, INR in the last 72 hours. ABG Recent Labs    06/13/24 1302  HCO3 28.5*    Studies/Results: No results found.  Anti-infectives: Anti-infectives (From admission, onward)    Start     Dose/Rate Route Frequency Ordered Stop   06/17/24 1800  vancomycin  (VANCOREADY) IVPB 500 mg/100 mL        500 mg 100 mL/hr over 60 Minutes Intravenous Every M-W-F (Hemodialysis) 06/15/24 0738     06/16/24 1800  ceFEPIme  (MAXIPIME ) 1 g in sodium chloride  0.9 % 100 mL IVPB        1 g 200 mL/hr over 30 Minutes Intravenous Every evening 06/15/24 0736     06/15/24 0830  vancomycin  (VANCOREADY) IVPB 750 mg/150 mL        750 mg 150 mL/hr over 60 Minutes Intravenous  Once 06/15/24 0735 06/15/24 1513   06/15/24 0745  ceFEPIme  (MAXIPIME ) 1 g in sodium chloride  0.9 % 100 mL IVPB        1 g 200 mL/hr over 30 Minutes Intravenous Once 06/15/24 0735 06/15/24 0902    06/04/24 1215  vancomycin  (VANCOCIN ) capsule 125 mg        125 mg Oral Daily 06/04/24 1116 06/05/24 0621   05/27/24 1000  vancomycin  (VANCOCIN ) capsule 125 mg        125 mg Oral Daily 05/21/24 1019 06/04/24 0959   05/20/24 1530  amoxicillin -clavulanate (AUGMENTIN ) 500-125 MG per tablet 1 tablet        1 tablet Oral Every 12 hours 05/20/24 1519 05/29/24 2152   05/17/24 0100  fidaxomicin  (DIFICID ) tablet 200 mg        200 mg Oral 2 times daily 05/17/24 0003 05/26/24 1037   05/17/24 0100  metroNIDAZOLE  (FLAGYL ) IVPB 500 mg  Status:  Discontinued        500 mg 100 mL/hr over 60 Minutes Intravenous Every 12 hours 05/17/24 0003 05/20/24 1519       Assessment/Plan: s/p Procedure(s): SIGMOIDOSCOPY, FLEXIBLE (N/A) Agree with palliative care consult 68 yo female with C difficile colitis admitted last month and tx w/ Vanc and Dificid  as well as imaging evidence of a colouterine fistula and now with near obstructing infiltrative mass 16cm from anal verge, unclear if this is GYN related or new malignancy, GI bleed - Hold anticoagulation.  - IR embo 10/2, hgb 10.6 -biopsies  pending. -agree with Dr. Viktoria that surgical intervention would be high risk and morbid for this patient with numerous co-morbidities. -agree with continued palliative involvement -no immediate surgical plans at this time, but will continue to follow and monitor given bleeding and obstructive nature.   FEN - renal diet VTE - SCDs, hold Eliquis  for anemia  ID - Seen by ID 9/30. No current abx.    - Per TRH -  Hx C. Diff - Per GI and ID. Imodium. No current abx.  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

## 2024-06-16 NOTE — Progress Notes (Signed)
 Messaged Dr. Jonel via epic chat about elevated BP.

## 2024-06-16 NOTE — Progress Notes (Signed)
 Progress Note   Patient: Tracy Jennings FMW:[id number] DOB: [birth year] DOA: 05/16/2024     31 DOS: the patient was seen and examined on 06/16/2024 at 8:14AM      Brief hospital course: 68 y.o. F with ESRD on HD MWF, cervical cancer lost to follow up, HTN, and cognitive impairment who presented with stool from vagina, found to have colo-uterine fistula, due to mass, failure to thrive.        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  There is broad consensus from general surgery, GYN oncology that the patient is not an operative candidate and her fistula is incurable.  There is also high clinical suspicion that this is cancer.    More alert today, but not able or willing to engage in discussions of situation, goals of care -Continue to engage with family with regarding goals of care -Follow immunohistochemistry - Continue Vanco and cefepime , day 2 of 5 - Follow-up blood cultures      History of cervical clear cell adenoCA Masslike consolidation of the left lung Pelvic lymphadenopathy See summary from yesterday, and Appell note from today    Acute metabolic encephalopathy Dementia See summary from 10/4.  This is slightly better today  Acute blood loss anemia due to fistula on chronic anemia of CKD See summary 10/4 Hemoglobin stable today, no clinical bleeding reported  ESRD (end stage renal disease) (HCC) - Consult Nephrology for HD  Acute right internal jugular DVT -Risk of anticoagulation outweighs benefit  PVD (peripheral vascular disease) - Hold aspirin  and Lipitor at this time, due to poor intake  Pleural effusion IR previously consulted, not enough fluid to tap.  Protein-calorie malnutrition, severe Failure to thrive Recent failed trial of tube feeding due to diarrhea.  Not a candidate for TPN - Consult palliative care  and dietitian  Essential hypertension Blood pressure better controlled - Continue clonidine  patch - Continue scheduled labetalol  - Continue as needed hydralazine   Type 2 diabetes mellitus with other specified complication (HCC) Glucose controlled - Continue sliding scale corrections   C. difficile colitis Resolved        Subjective: Patient is weak and tired, no new fever, she is slightly more alert today.  She has no respiratory symptoms, she complains of abdominal pain     Physical Exam: BP (!) 147/91 (BP Location: Right Arm)   Pulse 85   Temp 98.2 F (36.8 C) (Oral)   Resp (!) 21   Ht 5' 3 (1.6 m)   Wt 52.2 kg   LMP  (LMP Unknown)   SpO2 99%   BMI 20.39 kg/m   Frail elderly female, appears wasted way, debilitated, lying in fetal position Heart rate normal, no murmurs, no peripheral edema Respiratory rate seems normal, lung sounds overall diminished, no rales or wheezes appreciated Abdomen soft, tender to palpation throughout, nothing focal, just grimace, no guarding Attention diminished, makes eye contact, oriented to self, hospital, but psychomotor slowing is noted and she will not answer further questions after 1 or 2 answers, she has severe generalized weakness, she grimaces in pain with any movement    Data Reviewed: Basic metabolic panel shows hyponatremia, elevated creatinine CBC shows persistent leukocytosis, stable anemia     Family Communication: See IPAL note from later today    Disposition: Status is: Inpatient         Author: [name], MD 06/16/2024  3:07 PM  For on call review [email or web address].

## 2024-06-16 NOTE — Progress Notes (Signed)
   06/16/24 1533  Hand-off documentation  Hand-off Given Given to Transfer Unit/facility (6N)  Report given to (Full Name) Eleanore, RN   Pt transferred to 6N-03 with all personal belongings. Pt's family called and updated.

## 2024-06-17 DIAGNOSIS — F03918 Unspecified dementia, unspecified severity, with other behavioral disturbance: Secondary | ICD-10-CM | POA: Diagnosis not present

## 2024-06-17 DIAGNOSIS — N186 End stage renal disease: Secondary | ICD-10-CM | POA: Diagnosis not present

## 2024-06-17 DIAGNOSIS — N824 Other female intestinal-genital tract fistulae: Secondary | ICD-10-CM | POA: Diagnosis not present

## 2024-06-17 DIAGNOSIS — Z515 Encounter for palliative care: Secondary | ICD-10-CM | POA: Diagnosis not present

## 2024-06-17 LAB — CBC
HCT: 32.3 % — ABNORMAL LOW (ref 36.0–46.0)
Hemoglobin: 10.5 g/dL — ABNORMAL LOW (ref 12.0–15.0)
MCH: 29.9 pg (ref 26.0–34.0)
MCHC: 32.5 g/dL (ref 30.0–36.0)
MCV: 92 fL (ref 80.0–100.0)
Platelets: 193 K/uL (ref 150–400)
RBC: 3.51 MIL/uL — ABNORMAL LOW (ref 3.87–5.11)
RDW: 18.2 % — ABNORMAL HIGH (ref 11.5–15.5)
WBC: 23.1 K/uL — ABNORMAL HIGH (ref 4.0–10.5)
nRBC: 0 % (ref 0.0–0.2)

## 2024-06-17 LAB — RENAL FUNCTION PANEL
Albumin: 1.8 g/dL — ABNORMAL LOW (ref 3.5–5.0)
Anion gap: 13 (ref 5–15)
BUN: 44 mg/dL — ABNORMAL HIGH (ref 8–23)
CO2: 21 mmol/L — ABNORMAL LOW (ref 22–32)
Calcium: 8 mg/dL — ABNORMAL LOW (ref 8.9–10.3)
Chloride: 97 mmol/L — ABNORMAL LOW (ref 98–111)
Creatinine, Ser: 5.19 mg/dL — ABNORMAL HIGH (ref 0.44–1.00)
GFR, Estimated: 9 mL/min — ABNORMAL LOW
Glucose, Bld: 69 mg/dL — ABNORMAL LOW (ref 70–99)
Phosphorus: 4.8 mg/dL — ABNORMAL HIGH (ref 2.5–4.6)
Potassium: 4 mmol/L (ref 3.5–5.1)
Sodium: 131 mmol/L — ABNORMAL LOW (ref 135–145)

## 2024-06-17 LAB — GLUCOSE, CAPILLARY
Glucose-Capillary: 59 mg/dL — ABNORMAL LOW (ref 70–99)
Glucose-Capillary: 63 mg/dL — ABNORMAL LOW (ref 70–99)
Glucose-Capillary: 96 mg/dL (ref 70–99)
Glucose-Capillary: 89 mg/dL (ref 70–99)

## 2024-06-17 LAB — SURGICAL PATHOLOGY

## 2024-06-17 MED ORDER — OXYCODONE HCL 5 MG PO TABS
ORAL_TABLET | ORAL | Status: AC
  Filled 2024-06-17: qty 1

## 2024-06-17 MED ORDER — SPIRONOLACTONE 25 MG PO TABS
25.0000 mg | ORAL_TABLET | Freq: Every day | ORAL | Status: DC
Start: 2024-06-18 — End: 2024-06-19
  Administered 2024-06-18: 25 mg via ORAL
  Filled 2024-06-17: qty 1

## 2024-06-17 MED ORDER — HYDROMORPHONE HCL 1 MG/ML IJ SOLN
0.5000 mg | INTRAMUSCULAR | Status: DC | PRN
  Administered 2024-06-17 – 2024-06-18 (×4): 0.5 mg via INTRAVENOUS
  Filled 2024-06-17 (×4): qty 0.5

## 2024-06-17 MED ORDER — CLONIDINE HCL 0.3 MG/24HR TD PTWK
0.3000 mg | MEDICATED_PATCH | TRANSDERMAL | Status: DC
  Administered 2024-06-23: 0.3 mg via TRANSDERMAL
  Filled 2024-06-17: qty 1

## 2024-06-17 MED ORDER — LOSARTAN POTASSIUM 50 MG PO TABS
100.0000 mg | ORAL_TABLET | Freq: Every day | ORAL | Status: DC
Start: 2024-06-18 — End: 2024-06-19
  Administered 2024-06-18: 100 mg via ORAL
  Filled 2024-06-17: qty 2

## 2024-06-17 NOTE — Progress Notes (Signed)
 Called portable to order an airbed/mattress for the patient. No airbed/mattress available rt now. Will inform 1st shift RN to try to call later.

## 2024-06-17 NOTE — Progress Notes (Signed)
 [facility] Progress Note   Subjective:    Patient seen in [facility]. On dialysis.  Increase UF goal today  She's sleeping, doesn't rouse to voice, touch   Objective Vitals:   06/17/24 0508 06/17/24 0628 06/17/24 0810 06/17/24 0815  BP: (!) 169/68 (!) 159/68 (!) 202/64 (!) 185/74  Pulse: 83 86 85 84  Resp: 17  14 (!) 7  Temp: 98 F (36.7 C)  98.8 F (37.1 C)   TempSrc: Oral     SpO2: 100%  99% 97%  Weight:   51.1 kg   Height:       Physical Exam General:s omnolent, frail, nad  Heart: RRR Lungs: CTAB, nml WOB on RA Abdomen: soft, non distended Extremities: no LE edema Dialysis Access: LU AVG +b/t   Filed Weights   06/14/24 1219 06/14/24 1224 06/17/24 0810  Weight: 52.2 kg 52.2 kg 51.1 kg    Intake/Output Summary (Last 24 hours) at 06/17/2024 0825 Last data filed at 06/17/2024 0500 Gross per 24 hour  Intake 500 ml  Output --  Net 500 ml    Additional Objective Labs: Basic Metabolic Panel: Recent Labs  Lab 06/11/24 0028 06/13/24 0355 06/14/24 0922 06/15/24 0500 06/16/24 0500  NA 139   < > 136 136 132*  K 4.7   < > 5.2* 4.0 3.7  CL 100   < > 100 97* 93*  CO2 27   < > 21* 25 24  GLUCOSE 98   < > 111* 72 145*  BUN 46*   < > 49* 28* 36*  CREATININE 5.43*   < > 5.29* 3.53* 4.29*  CALCIUM  8.2*   < > 8.5* 8.5* 8.1*  PHOS 3.9  --   --   --   --    < > = values in this interval not displayed.   Liver Function Tests: Recent Labs  Lab 06/13/24 0355 06/14/24 0922 06/16/24 0500  AST 13* 12* 15  ALT 10 9 10   ALKPHOS 51 43 55  BILITOT 0.5 1.3* 0.7  PROT 5.8* 5.7* 5.4*  ALBUMIN 1.8* 2.2* 1.9*   CBC: Recent Labs  Lab 06/11/24 0028 06/11/24 2150 06/12/24 1544 06/13/24 0355 06/13/24 1302 06/13/24 1811 06/14/24 0500 06/14/24 1456 06/15/24 0500 06/16/24 0500  WBC 14.3* 16.9*  --  17.2*  --  21.9*  --   --  24.2* 22.5*  NEUTROABS 11.9* 14.2*  --   --   --   --   --   --   --   --   HGB 7.1* 6.8*   < > 9.5*   < > 6.1*   < > 10.7* 10.6* 10.0*   HCT 23.5* 22.2*   < > 30.0*   < > 19.6*   < > 31.9* 32.1* 30.6*  MCV 96.7 96.1  --  92.6  --  95.1  --   --  90.2 91.6  PLT 249 234  --  219  --  163  --   --  203 199   < > = values in this interval not displayed.   Blood Culture    Component Value Date/Time   SDES BLOOD RIGHT ARM 06/15/2024 1414   SPECREQUEST  06/15/2024 1414    BOTTLES DRAWN AEROBIC ONLY Blood Culture results may not be optimal due to an inadequate volume of blood received in culture bottles   CULT  06/15/2024 1414    NO GROWTH < 24 HOURS Performed at [facility], [address], [place], [place] [address]    REPTSTATUS PENDING 06/15/2024 1414    CBG: Recent Labs  Lab 06/16/24 0356 06/16/24 0836 06/16/24 1155 06/16/24 1718 06/16/24 2120  GLUCAP 119* 76 96 89 93    Medications:  ceFEPime  (MAXIPIME ) IV 1 g (06/16/24 2010)   vancomycin       (feeding supplement) PROSource Plus  30 mL Oral BID BM   Chlorhexidine  Gluconate Cloth  6 each Topical Q0600   cloNIDine   0.2 mg Transdermal Q Sun   darbepoetin (ARANESP ) injection - DIALYSIS  100 mcg Subcutaneous Q Mon-1800   Gerhardt's butt cream   Topical QID   labetalol   10 mg Intravenous Q6H   sodium chloride  flush  10-40 mL Intracatheter Q12H    Dialysis Orders: MWF [facility]  3.5hrs, 400/500, EDW 53kg, 2K/2.5Ca bath, AVG, heparin  6000 unit bolus + 400u/hr pump - Calcitriol  1.40mcg with HD - Sensipar 60mg  with HD - Micera 75mcg Q 4 weeks - per records, dose just ordered (not given) - Binder: Calcium  Acetate 667mh TID with meals   Assessment/Plan: Colo-uterine fistula: per abd CT. Initial plans for outpt management; Surgery/GI consulted 9/30 for new onset rectal/vaginal bleeding from fistula. Subsequently found to have large rectal mass suggesting malignancy; biopsy non diagnostic. Not a surgical candidate. IV antibiotics started. Palliative care involved for GOC discussions C-diff colitis: s/p course of po vancomycin .  ESRD: on HD  MWF. HD today.  HTN/volume:  BP remains elevated. Continue UF with HD as able. On IV meds due to somnolence.  Anemia of ESRD + ABLA.  Hgb 10.0  s/p 4 u prbcs this admission, last 10/2.  Hold ESA for now.  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 Dementia Malnutrition: NG tube placed, TF started 9/10 - stopped 9/14 (exacerbated diarrhea), NG tube now out.  GOC: Palliative care is following and family has been wanting full scope of care. Technically is tolerating HD but doubt contributing to QOL. With new finding of malignancy reassessing  further GOC.    Maisie Ronnald Acosta PA-C [facility] 06/17/2024,8:25 AM

## 2024-06-17 NOTE — Progress Notes (Signed)
 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 This was treated with definitive chemoRT completed in 08/2020. She was followed until 2023, at which time PET was recommended given concern for symptoms indicating a recurrence. The patient was then lost to follow-up.   Currently admitted at [facility] since 05/16/2024. On 9/30, the patient developed vaginal and rectal bleeding. No obvious active bleeding was noted on CT angiography. Yesterday she under went flex sigmoidoscopy with an infiltrative/obstructing mass 16 cm proximal to the anal verge (biopsies taken). Given quantity of post-procedure bleeding, the patient was urgently taken for IR evaluation with embolization of the anterior branch of the left internal iliac artery.  Subjective: Patient is currently receiving dialysis in the bed. She is sleeping. Will mumble one word answers without opening eyes. Reporting yes to abdominal pain, yes to nausea.  Objective:    06/17/2024    6:28 AM 06/17/2024    5:08 AM 06/17/2024   12:23 AM  Vitals with BMI  Systolic 159 169 829  Diastolic 68 68 79  Pulse 86 83 83    Resting in bed comfortably receiving dialysis. Sleeping intermittently. Very limited interaction.  A. RECTUM, BIOPSY:  -  Predominantly fibrinopurulent debris and focal fragments of crushed granulation tissue with dystrophic calcifications.      Latest Ref Rng & Units 06/16/2024    5:00 AM 06/15/2024    5:00 AM 06/14/2024    2:56 PM  CBC  WBC 4.0 - 10.5 K/uL 22.5  24.2    Hemoglobin 12.0 - 15.0 g/dL 89.9  89.3  89.2   Hematocrit 36.0 - 46.0 % 30.6  32.1  31.9   Platelets 150 - 400 K/uL 199  203         Latest Ref Rng & Units 06/16/2024    5:00 AM 06/15/2024    5:00 AM 06/14/2024    9:22 AM  BMP  Glucose 70 - 99 mg/dL 854  72  888   BUN 8 - 23 mg/dL 36  28  49    Creatinine 0.44 - 1.00 mg/dL 5.70  6.46  4.70   Sodium 135 - 145 mmol/L 132  136  136   Potassium 3.5 - 5.1 mmol/L 3.7  4.0  5.2   Chloride 98 - 111 mmol/L 93  97  100   CO2 22 - 32 mmol/L 24  25  21    Calcium  8.9 - 10.3 mg/dL 8.1  8.5  8.5      Assessment/Plan: 68 year old female currently admitted with hx of cervical cancer with large rectal mass and abnormal bleeding. Rectal biopsy from flex sigmoidscopy from 06/13/2024 non-diagnostic. Patient unable to engage in a conversation today. Given performance status, pt remains not a candidate for cancer related care and surgery being very high risk. Continue with palliative care management. See addition to note.

## 2024-06-17 NOTE — Plan of Care (Signed)

## 2024-06-17 NOTE — Progress Notes (Signed)
 Progress Note   Patient: Tracy Jennings FMW:[id number] DOB: 1955/11/04 DOA: 05/16/2024     32 DOS: the patient was seen and examined on 06/17/2024 at 10:40AM and 3:40PM      Brief hospital course: 68 y.o. 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  The initial plan after general surgery evaluation was discharged with outpatient follow-up for surgical resection of fistula, but it became clear that the patient's functional status had declined so much that she was too weak to sit up in a chair for dialysis, and her hospitalization was prolonged.  She subsequently then developed bleeding from her colo-uterine fistula, and GI were consulted.  On flexible sigmoidoscopy, she was found to have a large malignant appearing mass at the site of the fistula  Post-sigmoidoscopy developed heavy bleeding and was taken for embolization.  Post-embolization, biopsy inconclusive, but patient's functional status continues to decline.     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  Continues to struggle to stay awake, to eat, and has a high symptom burden with stool from vagina, abdominal pain.  Shared frankly with family that there are no curative options left, and each week in the hospital has seen her weaker and weaker.  They are processing, we are exploring plans for next steps - Consult Palliative    Acute metabolic encephalopathy Dementia Mentation remains poor  Acute blood loss anemia due to fistula on chronic anemia of CKD Hgb stable - Check iron  studies  ESRD (end stage renal disease) (HCC) Did not tolerate HD well today - Consult Nephrology   Acute right internal jugular DVT See previous, not safe for anticoagulation.  PVD (peripheral vascular disease) - Hold aspirin  and Lipitor in setting of poor oral intake  Pleural effusion IR previously consulted, not enough fluid to tap.  Protein-calorie malnutrition, severe Failure to thrive See above  Essential hypertension BP remains elevated.  Poor alertness and Poor PO intake limit home oral meds  - Continue scheduled labetalol  - Attempt to resume losartan , spironolactone  - Hold Coreg , diltiazem  - Increase clonidine  patch  Type 2 diabetes mellitus with other specified complication (HCC) Glucose low due to FTT - Stop SSI          Subjective: Seen on HD, crying out, changed to hydromorphone , rested better afterwards.  No fever, no respiraotry symptoms, no heavy bleeding.     Physical Exam: BP (!) 186/49 (BP Location: Right Arm)   Pulse 80   Temp 98.4 F (36.9 C) (Axillary)   Resp 16   Ht 5' 3 (1.6 m)   Wt 51.1 kg   LMP  (LMP Unknown)   SpO2 99%   BMI 19.96 kg/m   General: Seen on HD, appears chronically ill, opens eyes briefly, the ncloses them, only groans in pain, shakes head yes to pain in abdomen Cardiovascular: Tachycardic on my exam, soft SEM, diffuse anasarca  Respiratory: Fast due to pain, diminished.  CTAB without rales or wheezes. Abdominal: Abdomen soft tender in lower quadrants Neuro/Psych: Weak, does not follow exam.     Data Reviewed: BMP shows nyponatremia CBC shows leukocytosis, anemia  Family Communication: Brother, son, sister at bedside    Disposition: Status is: Inpatient         Author: [name], MD 06/17/2024 3:48 PM  For on call review [email or web address].

## 2024-06-17 NOTE — Progress Notes (Signed)
   06/17/24 1110  Vitals  Pulse Rate 86  Resp 18  BP (!) 194/78  SpO2 99 %  Post Treatment  Dialyzer Clearance Lightly streaked  Hemodialysis Intake (mL) 0 mL  Liters Processed 61.3  Fluid Removed (mL) 1200 mL  Tolerated HD Treatment No (Comment)  Post-Hemodialysis Comments TERMINATED 22 MINS EARLY DUE TO DISCOMFORT  AVG/AVF Arterial Site Held (minutes) 10 minutes  AVG/AVF Venous Site Held (minutes) 10 minutes   Received patient in bed to unit.  Alert and oriented.  Informed consent signed and in chart.   TX duration:2HRS  Transported back to the room  Alert, without acute distress.  Hand-off given to patient's nurse.   Access used: LAVG Access issues: NONE  Total UF removed: 1.2L Medication(s) given: OXY IR    Na'Shaminy T [facility]

## 2024-06-17 NOTE — Progress Notes (Signed)
 SLP Cancellation Note  Patient Details Name: Tracy Jennings MRN: [id number] DOB: 26-Apr-1956   Cancelled treatment:       Reason Eval/Treat Not Completed: Patient at procedure or test/unavailable (pt in HD this am). Will f/u as able.    Leita SAILOR., M.A. CCC-SLP Acute Rehabilitation Services Office: [phone]  Secure chat preferred  06/17/2024, 8:32 AM

## 2024-06-17 NOTE — Progress Notes (Signed)
 Soke with Dr. Jonel regarding this patient.  She is currently being followed by palliative care and by him for discussions for GOC.  Patient is not deemed to be candidate for [facility] resection or for chemotherapy.  Agree with this surgically as patient has multiple co-morbidities and would not tolerate a large operation for resection etc.  She is currently still moving her bowels with 2 BMs documented yesterday so she is not clinically obstructed.  No plans for diverting colostomy at this time.  We will be available if questions arise, but for now will defer further plans to palliative and primary service.  Burnard FORBES Banter 9:17 AM 06/17/2024

## 2024-06-17 NOTE — Progress Notes (Signed)
 Daily Progress Note   Patient Name: Tracy Jennings       Date: 06/17/2024 DOB: October 20, 1955  Age: 68 y.o. MRN#: [id number] Attending Physician: Jonel Lonni SQUIBB, * Primary Care Physician: Zachary Idelia LABOR, MD Admit Date: 05/16/2024  Reason for Consultation/Follow-up: Establishing goals of care  Subjective: Medical records reviewed including progress notes, labs and imaging. Patient assessed at the bedside. She is curled over after receiving personal care from nursing. Just returned from HD. Tells me she is more comfortable now, though still would like some pain medication. Discussed with RN.   Met with patient's family in the waiting room (2 brothers, 1 sister and her son). Debriefed yesterday's GOC discussion and created space and opportunity for family's thoughts and feelings on patient's current illness. I provided an update on patient's escalating discomfort and the medical team's inability to console her or determine specific source of discomfort during today's HD session. They are surprised to hear this, as she has never had issues with dialysis before. Emotional support and therapeutic listening was provided.   I encouraged them to continue reflecting on the overall care plan and how to support patient's comfort and quality of life without adding additional distress. Shared my concern that dialysis will cause more harm than good in the long run. Family requested confirmation that the next session would be on Wednesday and will talk with other siblings not currently present. I also encouraged family to re-discuss code status in light of her terminal cancer diagnosis, ensuring that everyone is on the same page if feelings have changed. Patient's son wondered if there was a way to access records for the procedures that have been done and I  provided him with review of MyChart access process and their phone number. No other needs at this time.    Questions and concerns addressed. PMT will continue to support holistically.   Length of Stay: 32   Physical Exam Vitals and nursing note reviewed.  Constitutional:      General: She is awake. She is not in acute distress.    Appearance: She is ill-appearing.  HENT:     Head: Normocephalic.  Cardiovascular:     Rate and Rhythm: Normal rate.  Pulmonary:     Effort: Pulmonary effort is normal.  Neurological:     Mental Status: She is alert.  Psychiatric:        Behavior: Behavior is withdrawn.            Vital Signs: BP (!) 151/73   Pulse 88   Temp 98.8 F (37.1 C)   Resp 18   Ht 5' 3 (1.6 m)   Wt 51.1 kg   LMP  (LMP Unknown)   SpO2 100%   BMI 19.96 kg/m  SpO2: SpO2: 100 % O2 Device: O2 Device: Room Air O2 Flow Rate:        Palliative Assessment/Data: 20%   Palliative Care Assessment & Plan   Patient Profile: 68 y.o. female  with past medical history of cognitive impairment, ESRD on HD MWF, cervical cancer, HTN, HLD, anemia and recent hospitalizations from 6/24-7/22 for hypertensive urgency and delirium, and 8/17-8/24 for C. difficile colitis for which she was treated with  p.o. vancomycin   admitted on 05/16/2024 with weakness, nausea, diarrhea and abdominal discomfort. CT abdomen and pelvis showed circumferential wall thickening of rectosigmoid colon suspicious for colitis, Colo uterine fistula, increased bilateral pleural effusions, left > right and area of rounded density in anterior LLL measuring 3.1 cm, and progressive anasarca.  General surgery at [facility] was consulted and recommended higher level of care and the ER physician discussed with Dr. Dasie general surgeon at [facility] who agreed to see patient in consult. Patient was started on Dificid  and Flagyl  for C. difficile colitis.  Evaluated by general surgery - urgent surgical intervention is not indicated. PMT  consulted to discuss GOC.    Assessment: Goals of care conversation FTT Dementia ESRD on HD Acute metabolic encephalopathy superimposed on dementia, improving Colouterine fistula Concern for malignant mass at colouterine fistula   Recommendations/Plan: Continue full code for now, encouraged family to renew conversations  Continue current care plan for now. Family will continue discussions regarding HD and other life-prolonging interventions Ongoing GOC discussions Psychosocial and emotional support provided PMT will continue to follow and support   Prognosis:  Poor long-term prognosis  Discharge Planning: To Be Determined   Care plan was discussed with Patient's son, patient's 3 siblings, RN, primary attending, nephrology PA          Brooklee Michelin SHAUNNA Fell, PA-C  Palliative Medicine Team Team phone # [phone]  Thank you for allowing the Palliative Medicine Team to assist in the care of this patient. Please utilize secure chat with additional questions, if there is no response within 30 minutes please call the above phone number.  Palliative Medicine Team providers are available by phone from 7am to 7pm daily and can be reached through the team cell phone.  Should this patient require assistance outside of these hours, please call the patient's attending physician.

## 2024-06-17 NOTE — Progress Notes (Signed)
 PT Cancellation Note  Patient Details Name: Tracy Jennings MRN: [id number] DOB: 02-15-1956   Cancelled Treatment:    Reason Eval/Treat Not Completed: (P) Patient at procedure or test/unavailable, pt off unit at HD. Will check back as schedule allows to continue with PT POC.  [name]. PTA Acute Rehabilitation Services Office: [phone]    [name] 06/17/2024, 10:30 AM

## 2024-06-17 NOTE — Procedures (Signed)
 Patient seen and examined on Hemodialysis. The procedure was supervised and I have made appropriate changes. BP (!) 194/78   Pulse 86   Temp 98.8 F (37.1 C)   Resp 18   Ht 5' 3 (1.6 m)   Wt 51.1 kg   LMP  (LMP Unknown)   SpO2 99%   BMI 19.96 kg/m   QB 300 mL min , UF goal even  Crying out, inconsolable.  Is not able to express what is bothering her.  Treatment terminated.     Almarie Bonine MD [facility] [phone] 11:17 AM

## 2024-06-18 DIAGNOSIS — N824 Other female intestinal-genital tract fistulae: Secondary | ICD-10-CM | POA: Diagnosis not present

## 2024-06-18 DIAGNOSIS — Z515 Encounter for palliative care: Secondary | ICD-10-CM | POA: Diagnosis not present

## 2024-06-18 DIAGNOSIS — G9341 Metabolic encephalopathy: Secondary | ICD-10-CM | POA: Diagnosis not present

## 2024-06-18 DIAGNOSIS — N186 End stage renal disease: Secondary | ICD-10-CM | POA: Diagnosis not present

## 2024-06-18 DIAGNOSIS — F03918 Unspecified dementia, unspecified severity, with other behavioral disturbance: Secondary | ICD-10-CM | POA: Diagnosis not present

## 2024-06-18 LAB — COMPREHENSIVE METABOLIC PANEL WITH GFR
ALT: 10 U/L (ref 0–44)
AST: 14 U/L — ABNORMAL LOW (ref 15–41)
Albumin: 1.9 g/dL — ABNORMAL LOW (ref 3.5–5.0)
Alkaline Phosphatase: 46 U/L (ref 38–126)
Anion gap: 12 (ref 5–15)
BUN: 24 mg/dL — ABNORMAL HIGH (ref 8–23)
CO2: 26 mmol/L (ref 22–32)
Calcium: 7.9 mg/dL — ABNORMAL LOW (ref 8.9–10.3)
Chloride: 96 mmol/L — ABNORMAL LOW (ref 98–111)
Creatinine, Ser: 3.52 mg/dL — ABNORMAL HIGH (ref 0.44–1.00)
GFR, Estimated: 14 mL/min — ABNORMAL LOW (ref >60)
Glucose, Bld: 76 mg/dL (ref 70–99)
Potassium: 3.9 mmol/L (ref 3.5–5.1)
Sodium: 134 mmol/L — ABNORMAL LOW (ref 135–145)
Total Bilirubin: 1 mg/dL (ref 0.0–1.2)
Total Protein: 5.4 g/dL — ABNORMAL LOW (ref 6.5–8.1)

## 2024-06-18 LAB — FERRITIN: Ferritin: 4277 ng/mL — ABNORMAL HIGH (ref 11–307)

## 2024-06-18 LAB — CBC
HCT: 32.3 % — ABNORMAL LOW (ref 36.0–46.0)
Hemoglobin: 10.2 g/dL — ABNORMAL LOW (ref 12.0–15.0)
MCH: 30.2 pg (ref 26.0–34.0)
MCHC: 31.6 g/dL (ref 30.0–36.0)
MCV: 95.6 fL (ref 80.0–100.0)
Platelets: 196 K/uL (ref 150–400)
RBC: 3.38 MIL/uL — ABNORMAL LOW (ref 3.87–5.11)
RDW: 18.1 % — ABNORMAL HIGH (ref 11.5–15.5)
WBC: 22.4 K/uL — ABNORMAL HIGH (ref 4.0–10.5)
nRBC: 0 % (ref 0.0–0.2)

## 2024-06-18 LAB — IRON AND TIBC: Iron: 35 ug/dL (ref 28–170)

## 2024-06-18 LAB — GLUCOSE, CAPILLARY: Glucose-Capillary: 67 mg/dL — ABNORMAL LOW (ref 70–99)

## 2024-06-18 MED ORDER — CHLORHEXIDINE GLUCONATE CLOTH 2 % EX PADS
6.0000 | MEDICATED_PAD | Freq: Every day | CUTANEOUS | Status: DC
  Administered 2024-06-18 – 2024-06-24 (×5): 6 via TOPICAL

## 2024-06-18 NOTE — Plan of Care (Signed)
  Problem: Clinical Measurements: Goal: Ability to maintain clinical measurements within normal limits will improve Outcome: Progressing   Problem: Clinical Measurements: Goal: Will remain free from infection Outcome: Progressing   Problem: Clinical Measurements: Goal: Cardiovascular complication will be avoided Outcome: Progressing   Problem: Elimination: Goal: Will not experience complications related to urinary retention Outcome: Progressing   Problem: Pain Managment: Goal: General experience of comfort will improve and/or be controlled Outcome: Progressing   Problem: Education: Goal: Ability to describe self-care measures that may prevent or decrease complications (Diabetes Survival Skills Education) will improve Outcome: Progressing   Problem: Health Behavior/Discharge Planning: Goal: Ability to manage health-related needs will improve Outcome: Progressing   Problem: Skin Integrity: Goal: Risk for impaired skin integrity will decrease Outcome: Progressing

## 2024-06-18 NOTE — Progress Notes (Signed)
 Progress Note   Patient: Tracy Jennings FMW:[id number] DOB: 1955/10/02 DOA: 05/16/2024     66 DOS: the patient was seen and examined on 06/18/2024 at 7:10AM      Brief hospital course: 68 y.o. 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   The initial plan after general surgery evaluation was discharged with outpatient follow-up for surgical resection of fistula, but it became clear that the patient's functional status had declined so much that she was too weak to sit up in a chair for dialysis, and her hospitalization was prolonged.   She subsequently then developed bleeding from her colo-uterine fistula, and GI were consulted.   On flexible sigmoidoscopy, she was found to have a large malignant appearing mass at the site of the fistula   Post-sigmoidoscopy developed heavy bleeding and was taken for embolization.   Post-embolization, biopsy inconclusive, but patient's functional status continues to decline.idoscopy requiring embolization.     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  Due to weakness, failure to thrive, patient unable to progress to discharge (couldn't sit up in chair for HD) and eventually developed DVT, was started on Eliquis , and started to have rectal/vaginal bleeding.    GI consulted, flexible sigmoidoscopy performed 10/2, and found to have malignant appearing mass at fistula.  Biopsy led to brisk bleeding, requiring emergency IR embolization of anterior division of left internal iliac artery.  Bleeding abated and has been stable since then.  Biopsy with debris only, no cellular material.  Immunohistochemical stains still pending.  There is broad consensus from general  surgery, GYN oncology that the patient is not an operative candidate and her fistula is incurable. There is also high clinical suspicion that this is cancer, but that biopsy nor chemo are feasible given her overall malnutrition and functional status. - Continue to engage family regarding goals of care    History of cervical clear cell adenoCA Masslike consolidation of the left lung Pelvic lymphadenopathy Failure to thrive As noted previously, the patient underwent chemotherapy and radiation as well as brachytherapy in 2021 for her unusual cervical cancer.  In 2023, GYN oncology notes imply that she had some thickening of the colon and pelvic lymphadenopathy and a PET scan and follow-up imaging was planned, but does not appear to ever been completed and unfortunately no follow-up since then.  Biopsy inconclusive, but strongly clinically suspect cancer.  Continues to struffle to stay awake, to eat, and has a high symptom burden with stool from vagina, abdominal pain.  Yesterday's dialysis was halted early.  Shared frankly with family Sunday and Monday that there are no curative options left, and each week in the hospital has seen her weaker and weaker.  Family wish to observe another dialysis session.  Paitent no longer able to participate in goals of care discussions, decision makers are her son [name] and sister [name]  Acute metabolic encephalopathy Dementia At baseline, patient interactive and lives at home, familiar with surroundings but with memory loss.  Here, she has been alert but oriented only to self for most of hospital stay.  Since flex sig on 10/2, she is less responsive, somnolent, due to blood loss, malnutrition primarily.  No focal signs but exam limited due to somnolence.    Acute blood loss  anemia due to fistula on chronic anemia of CKD Baseline 9-10.  Required 2 units transfusion early in hospital stay.    Then transfused 2u PRBC 10/1 and another 2u post-embo on 10/2,  stable since.    - Trend hemoglobin  ESRD (end stage renal disease) (HCC) - Consult Nephrology for HD  Acute right internal jugular DVT She has had life-threatening bleeding. Risk of anticoagulation outweighs benefit at this time - Monitor arm clinically  PVD (peripheral vascular disease) - Hold aspirin  and Lipitor in setting of poor oral intake  Pleural effusion IR previously consulted, not enough fluid to tap.  C. difficile colitis Resolved  Protein-calorie malnutrition, severe Failure to thrive Failed trial of tube feeds earlier in this hospital stay due to worsening of diarrhea.   Not a candidate for TPN. - Consult Palliative care - Consult Dietitian  Essential hypertension BP measurements may or may not be accurate, patient lies on side and due to her terminal state, is hard to reposition for accurate BP measurements.    - Continue clonidine  patch, losartan , spironolactone  - IV hydralazine  PRN and labetalol  scheduled  Type 2 diabetes mellitus with other specified complication (HCC) Glucose low due to FTT - Stop SSI - Accuchecks once daily          Subjective: Patient does not engage.  Blanket over head.  When I call name, says yeah. When I ask any other question, even direct simple ones, she does not answer.     Physical Exam: BP (!) 157/65 (BP Location: Right Arm)   Pulse 80   Temp 98 F (36.7 C) (Oral)   Resp 16   Ht 5' 3 (1.6 m)   Wt 51.1 kg   LMP  (LMP Unknown)   SpO2 99%   BMI 19.96 kg/m   Cachectic adult female, lying in bed, blanket over head, will not let me pull it down Over blanket, HR is fast, there is a loud systolic murmur, unchanged thorughout the week Respiratory rate seems normal unlabored, lungs sound diminished, does not take a deep breath, no obvious rales, whezing Abdomen exam limited but does not tense Lays in fetal position on right side, does not engage.     Data Reviewed: Discussed with Palliative Care BMP shows  hyponatremia, stable Cr CBC shows stable anemia Feritin elevated.     Family Communication: son at bedside briefly    Disposition: Status is: Inpatient 68 yo F transferred for evaluation of colouterine fistula.    Hospitalization prolonged by deconditioning, inability to sit up for dialysis.  Eventually developed bleeding and discovered that fistula was due to malignant appearing mass.  Patient's functional status and alertness are worsening.  Family are working with Palliative Care to come up with management plan.  She has too high a symptom burden to safely care for at home.  A transition to comfort care with stopping HD and residential hospice would be reasonable.  If stopping HD were unconscionable to family, she would need to remain hospitalized indefinitely until she deteriorated.           Author: [name], MD 06/18/2024 9:21 PM  For on call review [email or web address].

## 2024-06-18 NOTE — Progress Notes (Signed)
   06/18/24 0930  Assess: MEWS Score  BP (!) 212/73 (2nd recheck- RN made aware.)  MAP (mmHg) 111  Pulse Rate 87  Resp 16  Level of Consciousness Alert  SpO2 100 %  O2 Device Room Air  Assess: MEWS Score  MEWS Temp 0  MEWS Systolic 2  MEWS Pulse 0  MEWS RR 0  MEWS LOC 0  MEWS Score 2  MEWS Score Color Yellow  Assess: if the MEWS score is Yellow or Red  Were vital signs accurate and taken at a resting state? Yes  Does the patient meet 2 or more of the SIRS criteria? No  MEWS guidelines implemented  Yes, yellow  Treat  MEWS Interventions Considered administering scheduled or prn medications/treatments as ordered  Take Vital Signs  Increase Vital Sign Frequency  Yellow: Q2hr x1, continue Q4hrs until patient remains green for 12hrs  Escalate  MEWS: Escalate Yellow: Discuss with charge nurse and consider notifying provider and/or RRT  Provider Notification  Provider Name/Title Danford,MD  Date Provider Notified 06/18/24  Time Provider Notified 1037  Method of Notification Page  Notification Reason Other (Comment) (yellow MEWS)  Provider response No new orders  Date of Provider Response 06/18/24  Time of Provider Response 1111  Assess: SIRS CRITERIA  SIRS Temperature  0  SIRS Respirations  0  SIRS Pulse 0  SIRS WBC 1  SIRS Score Sum  1

## 2024-06-18 NOTE — Progress Notes (Addendum)
 Nutrition Follow-up  DOCUMENTATION CODES:   Severe malnutrition in context of chronic illness  INTERVENTION:  Encourage PO intake - Currently on Dysphagia 1, thin liquids per family request  Nursing to assist with meals  House trays  Prosource Plus BID, each supplement provides 100 kcal and 15 gm protein  Rena-Vite daily  100 mg Thiamine  daily  Follow along for GOC    *Per family, pt is lactose intolerant  *Pt continues to refuse all ONS   NUTRITION DIAGNOSIS:   Severe Malnutrition related to chronic illness as evidenced by severe muscle depletion, severe fat depletion, meal completion < 25%, per patient/family report, energy intake < or equal to 75% for > or equal to 1 month. - Ongoing   GOAL:   Patient will meet greater than or equal to 90% of their needs - Ongoing   MONITOR:   PO intake, Supplement acceptance, Labs, I & O's  REASON FOR ASSESSMENT:   Consult Assessment of nutrition requirement/status  ASSESSMENT:   Pt with PMH of dementia, ESRD on HD MWF, cervical ca, HTN, HLD, anemia, recent hospitalizations from 6/24 - 7/22 for hypertensive urgency and delirium, and 8/17 - 8/24 for Cdiff colitis now admitted from home with abd discomfort, weakness, Nausea, and diarrhea.   9/4 - CT of abd and pelvis showed rectosigmoid colon suspicious for colitis, colo-uterine fistula, increased bilateral pleural effusions, and progressive anasarca. Surgery, Dasie, consulted and recommends continued treatment for cdiff and outpt follow up and ok for diet advancement.  9/8 - Left pleural effusion too small for Thoracocentesis, concern for malignancy. HD today  9/10 - Cortrak placed (needs to be confirmed by x-ray before use), TF to be started, HD today  9/14 - tube feeds held 9/15 - Tube feeds discontinued as pt with increased stools per MD, cortrak only for meds 9/17 - NGT removed  10/2- s/p flex sigmoidoscopy with an infiltrative/obstructing mass 16 cm proximal to the anal verge  suggesting malignancy, biopsies pending   Pt more awake today and interactive however, notably in pain. Pt kept trying to move to get comfortable in bed and was crying out in pain.  Per family at bedside, pt's intake has been improving. Diet downgraded by speech to purees. Had 50% of her eggs and sausage this morning. In the past week however pt has been consuming on average only 8% of her meals as she refuses to eat sometimes. Pt continues to decline and is struggling to stay awake long enough to eat.Pt continues to drop below her EDW, FTT.  Refuses Boost Plus, Prosource Plus, and any other ONS. Family has been ordering meals for pt.    Pt has with  with new mass in colon, suspecting malignancy. Pt remains not a candidate for cancer related care and surgery being very high risk.  Ongoing GOC, family considered hospice but now that pt is eating better will remain full scope. Did not tolerate HD Monday.     Disposition: SNF pending bed offer and insurance authorization   Admit weight: 57 kg Current weight: 52.7 kg EDW: 53 kg   Average Meal Intake: 9/5-9/9: 6% intake x 4 recorded meals 9/10-9/12: 0% intake x 2 recorded meals 9/17-9/23: 30% intake x 6 recorded meals  9/25-10/1: 37% intake x 7 recorded meals  10/5-10/7: 8% intake x 8 recorded meals.   Nutritionally Relevant Medications: Scheduled Meds:  (feeding supplement) PROSource Plus  30 mL Oral BID BM   Labs Reviewed: Sodium 134 BUN 24 Creatinine 3.52 AST 14 GFR  14 CBG ranges from 59-119 mg/dL over the last 24 hours HgbA1c 5.4  Diet Order:   Diet Order             DIET - DYS 1 Room service appropriate? Yes; Fluid consistency: Thin  Diet effective now                   EDUCATION NEEDS:   Not appropriate for education at this time  Skin:  Skin Assessment: Reviewed RN Assessment  Last BM:  10/6 X 4, TYPE 7, 10/6 X 2 TYPE 7  Height:   Ht Readings from Last 1 Encounters:  05/16/24 5' 3 (1.6 m)    Weight:    Wt Readings from Last 1 Encounters:  06/17/24 51.1 kg    BMI:  Body mass index is 19.96 kg/m.  Estimated Nutritional Needs:   Kcal:  1600-1900 kcal  Protein:  80-100 gm  Fluid:  1 L + UOP   Olivia Kenning, RD Registered Dietitian  See Amion for more information

## 2024-06-18 NOTE — Progress Notes (Signed)
 [facility] Progress Note   Subjective:    Tolerated yesterday's HD with net UF 1.2L off. Denies SOB.   Objective Vitals:   06/17/24 1934 06/18/24 0020 06/18/24 0400 06/18/24 0500  BP: (!) 171/69 (!) 183/84 (!) 208/84 (!) 180/88  Pulse: 90 73 89   Resp: 18 17 17    Temp: 98.1 F (36.7 C) 97.9 F (36.6 C) 97.8 F (36.6 C)   TempSrc: Oral Oral Oral   SpO2:   98%   Weight:      Height:       Physical Exam General:s omnolent, frail, nad  Heart: RRR Lungs: CTAB, nml WOB on RA Abdomen: soft, non distended Extremities: no LE edema Dialysis Access: LU AVG +b/t   Filed Weights   06/14/24 1219 06/14/24 1224 06/17/24 0810  Weight: 52.2 kg 52.2 kg 51.1 kg    Intake/Output Summary (Last 24 hours) at 06/18/2024 0923 Last data filed at 06/17/2024 1850 Gross per 24 hour  Intake 70 ml  Output 1200 ml  Net -1130 ml    Additional Objective Labs: Basic Metabolic Panel: Recent Labs  Lab 06/16/24 0500 06/17/24 0730 06/18/24 0203  NA 132* 131* 134*  K 3.7 4.0 3.9  CL 93* 97* 96*  CO2 24 21* 26  GLUCOSE 145* 69* 76  BUN 36* 44* 24*  CREATININE 4.29* 5.19* 3.52*  CALCIUM  8.1* 8.0* 7.9*  PHOS  --  4.8*  --    Liver Function Tests: Recent Labs  Lab 06/14/24 0922 06/16/24 0500 06/17/24 0730 06/18/24 0203  AST 12* 15  --  14*  ALT 9 10  --  10  ALKPHOS 43 55  --  46  BILITOT 1.3* 0.7  --  1.0  PROT 5.7* 5.4*  --  5.4*  ALBUMIN 2.2* 1.9* 1.8* 1.9*   No results for input(s): LIPASE, AMYLASE in the last 168 hours. CBC: Recent Labs  Lab 06/11/24 2150 06/12/24 1544 06/13/24 1811 06/14/24 0500 06/15/24 0500 06/16/24 0500 06/17/24 0730 06/18/24 0203  WBC 16.9*   < > 21.9*  --  24.2* 22.5* 23.1* 22.4*  NEUTROABS 14.2*  --   --   --   --   --   --   --   HGB 6.8*   < > 6.1*   < > 10.6* 10.0* 10.5* 10.2*  HCT 22.2*   < > 19.6*   < > 32.1* 30.6* 32.3* 32.3*  MCV 96.1   < > 95.1  --  90.2 91.6 92.0 95.6  PLT 234   < > 163  --  203 199 193 196   < >  = values in this interval not displayed.   Blood Culture    Component Value Date/Time   SDES BLOOD RIGHT ARM 06/15/2024 1414   SPECREQUEST  06/15/2024 1414    BOTTLES DRAWN AEROBIC ONLY Blood Culture results may not be optimal due to an inadequate volume of blood received in culture bottles   CULT  06/15/2024 1414    NO GROWTH 2 DAYS Performed at [facility], [address], [place], [place] [address]    REPTSTATUS PENDING 06/15/2024 1414    Cardiac Enzymes: No results for input(s): CKTOTAL, CKMB, CKMBINDEX, TROPONINI in the last 168 hours. CBG: Recent Labs  Lab 06/17/24 1209 06/17/24 1253 06/17/24 1434 06/17/24 1801 06/18/24 0400  GLUCAP 59* 63* 96 89 67*   Iron Studies:  Recent Labs    06/18/24 0203  IRON 35  TIBC NOT CALCULATED  FERRITIN  5,722*   Lab Results  Component Value Date   INR 1.3 (H) 03/05/2024   INR 1.1 11/03/2019   INR 1.04 05/12/2018   Studies/Results: No results found.  Medications:   (feeding supplement) PROSource Plus  30 mL Oral BID BM   Chlorhexidine  Gluconate Cloth  6 each Topical Q0600   [START ON 06/23/2024] cloNIDine   0.3 mg Transdermal Q Sun   Gerhardt's butt cream   Topical QID   labetalol   10 mg Intravenous Q6H   losartan   100 mg Oral Daily   sodium chloride  flush  10-40 mL Intracatheter Q12H   spironolactone   25 mg Oral Daily    Dialysis Orders: MWF [facility]  3.5hrs, 400/500, EDW 53kg, 2K/2.5Ca bath, AVG, heparin  6000 unit bolus + 400u/hr pump - Calcitriol  1.25mcg with HD - Sensipar 60mg  with HD - Micera 75mcg Q 4 weeks - per records, dose just ordered (not given) - Binder: Calcium  Acetate 667mh TID with meals  Assessment/Plan: Colo-uterine fistula: per abd CT. Initial plans for outpt management; Surgery/GI consulted 9/30 for new onset rectal/vaginal bleeding from fistula. Subsequently found to have large rectal mass suggesting malignancy; biopsy non diagnostic. Not a surgical candidate. IV  antibiotics started. Palliative care involved for GOC discussions C-diff colitis: s/p course of po vancomycin .  ESRD: on HD MWF. Next HD 10/8.  HTN/volume:  BP remains elevated. Continue UF with HD as able. On IV meds due to somnolence.  Anemia of ESRD + ABLA.  Hgb 10.0  s/p 4 u prbcs this admission, last 10/2.  Hold ESA for now.  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 Dementia Malnutrition: NG tube placed, TF started 9/10 - stopped 9/14 (exacerbated diarrhea), NG tube now out.  GOC: Palliative care is following and family has been wanting full scope of care. Technically is tolerating HD but doubt contributing to QOL. With new finding of malignancy reassessing  further GOC.    Charmaine Piety, NP [facility] 06/18/2024,9:23 AM  LOS: 33 days

## 2024-06-18 NOTE — Progress Notes (Signed)
 SLP Cancellation Note  Patient Details Name: Tracy Jennings MRN: [id number] DOB: 01-Sep-1956   Cancelled treatment:       Reason Eval/Treat Not Completed: Other (comment) (Patient receiving personal care from nursing staff. SLP will continue efforts.)  Tracy IVAR Blase, MA, CCC-SLP Speech Therapy

## 2024-06-18 NOTE — Progress Notes (Signed)
 Physical Therapy Treatment Patient Details Name: Tracy Jennings MRN: [id number] DOB: 20-Sep-1955 Today's Date: 06/18/2024   History of Present Illness Patient is 68 yo female admitted on 05/16/24 for generalized weakness concerns for colouterine fistula. + C. Diff. 9/30 developed rectal/ vaginal bleeding.SABRA PMH significant for dementia, HTN, DM, HLD, cervical cancer, recurrent pancreatitis, ESRD on HD.    PT Comments  Pt resting in bed on arrival, initially agreeable to session and demonstrating ability to come to sitting EOB with mod A to elevate trunk. Once seated up EOB pt crying out in pain and attempting to return self to side lying multiple times. Pt able to tolerate sitting up EOB ~ 30-45 seconds. Pt requiring mod A to roll R/L for bed pad change, pt declining assist with peri-care during session. Pt requiring total A to reposition at [facility] and positioned to comfort in L side lying at end of session. Pt continues to benefit from skilled PT services to progress toward functional mobility goals.     If plan is discharge home, recommend the following: A lot of help with walking and/or transfers;Assist for transportation;Help with stairs or ramp for entrance;Supervision due to cognitive status;Assistance with cooking/housework   Can travel by private vehicle     No  Equipment Recommendations  None recommended by PT    Recommendations for Other Services       Precautions / Restrictions Precautions Precautions: Fall Recall of Precautions/Restrictions: Impaired Restrictions Weight Bearing Restrictions Per Provider Order: No     Mobility  Bed Mobility Overal bed mobility: Needs Assistance Bed Mobility: Supine to Sit, Rolling Rolling: Mod assist   Supine to sit: Mod assist Sit to supine: Min assist   General bed mobility comments: mod A to roll R/L for bed pad change, mod A to elevate trunk to sitting, poor sitting tolerance due to pain with pt returning self to sidelying without  assist and needing min A to bring LEs into bed    Transfers                        Ambulation/Gait                   Stairs             Wheelchair Mobility     Tilt Bed    Modified Rankin (Stroke Patients Only)       Balance Overall balance assessment: Needs assistance Sitting-balance support: Feet supported Sitting balance-Leahy Scale: Poor Sitting balance - Comments: frequently tries to lie back down Postural control: Left lateral lean                                  Communication Communication Communication: No apparent difficulties  Cognition Arousal: Alert Behavior During Therapy: Flat affect                             Following commands: Impaired Following commands impaired: Follows one step commands with increased time, Follows one step commands inconsistently    Cueing Cueing Techniques: Verbal cues, Gestural cues, Tactile cues, Visual cues  Exercises Other Exercises Other Exercises: pt unable to tolerate due to pain    General Comments General comments (skin integrity, edema, etc.): family present during session and supportive      Pertinent Vitals/Pain Pain Assessment Pain Assessment: Faces Faces Pain Scale: Hurts whole  lot Breathing: normal Negative Vocalization: occasional moan/groan, low speech, negative/disapproving quality Facial Expression: smiling or inexpressive Body Language: tense, distressed pacing, fidgeting Consolability: no need to console PAINAD Score: 2 Pain Location: general, rectal Pain Descriptors / Indicators: Discomfort, Moaning, Grimacing, Guarding Pain Intervention(s): Monitored during session, Limited activity within patient's tolerance, Repositioned    Home Living                          Prior Function            PT Goals (current goals can now be found in the care plan section) Acute Rehab PT Goals PT Goal Formulation: With patient/family Time For  Goal Achievement: 06/21/24 Progress towards PT goals: Not progressing toward goals - comment    Frequency    Min 1X/week      PT Plan      Co-evaluation              AM-PAC PT 6 Clicks Mobility   Outcome Measure  Help needed turning from your back to your side while in a flat bed without using bedrails?: A Lot Help needed moving from lying on your back to sitting on the side of a flat bed without using bedrails?: A Lot Help needed moving to and from a bed to a chair (including a wheelchair)?: Total Help needed standing up from a chair using your arms (e.g., wheelchair or bedside chair)?: Total Help needed to walk in hospital room?: Total Help needed climbing 3-5 steps with a railing? : Total 6 Click Score: 8    End of Session   Activity Tolerance: Patient limited by pain Patient left: in bed;with call bell/phone within reach;with bed alarm set;with family/visitor present Nurse Communication: Mobility status       Time: 1413-1430 PT Time Calculation (min) (ACUTE ONLY): 17 min  Charges:    $Therapeutic Activity: 8-22 mins PT General Charges $$ ACUTE PT VISIT: 1 Visit                     Therisa R. PTA Acute Rehabilitation Services Office: [phone]   [name] 06/18/2024, 2:55 PM

## 2024-06-18 NOTE — Progress Notes (Signed)
 Occupational Therapy Treatment Patient Details Name: Tracy Jennings MRN: [id number] DOB: 03-10-1956 Today's Date: 06/18/2024   History of present illness Patient is 68 yo female admitted on 05/16/24 for generalized weakness concerns for colouterine fistula. + C. Diff. 9/30 developed rectal/ vaginal bleeding.Tracy Jennings PMH significant for dementia, HTN, DM, HLD, cervical cancer, recurrent pancreatitis, ESRD on HD.   OT comments  Pt presented with NT finishing cleaning up and required total x2 to reposition to [facility]. Pt then agreed to attempt to sit at EOB but with two attempts started to cry. Pt then was reposition into the bed. Pt was not clear on if they want to continue with therapy at this time. Family was entering the room at the end of session and reported how we follow depending on what Palliative notes say.        If plan is discharge home, recommend the following:  Two people to help with walking and/or transfers;Two people to help with bathing/dressing/bathroom;Assistance with cooking/housework;Assistance with feeding;Direct supervision/assist for medications management;Direct supervision/assist for financial management;Assist for transportation;Help with stairs or ramp for entrance;Supervision due to cognitive status   Equipment Recommendations   (TBD depending progress)    Recommendations for Other Services      Precautions / Restrictions Precautions Precautions: Fall Recall of Precautions/Restrictions: Impaired Restrictions Weight Bearing Restrictions Per Provider Order: No       Mobility Bed Mobility Overal bed mobility: Needs Assistance Bed Mobility: Supine to Sit Rolling: Mod assist         General bed mobility comments: attempted x2 to go to sitting but started to become tearful and reporting it was to painfu;    Transfers                         Balance                                           ADL either performed or assessed with  clinical judgement   ADL Overall ADL's : Needs assistance/impaired         Upper Body Bathing: Maximal assistance;Bed level   Lower Body Bathing: Total assistance;Bed level   Upper Body Dressing : Maximal assistance   Lower Body Dressing: Total assistance;Bed level                      Extremity/Trunk Assessment Upper Extremity Assessment Upper Extremity Assessment: Generalized weakness            Vision       Perception     Praxis     Communication Communication Communication: No apparent difficulties   Cognition Arousal: Alert Behavior During Therapy: Flat affect Cognition: History of cognitive impairments             OT - Cognition Comments: following ionce step cues                 Following commands: Impaired Following commands impaired: Follows one step commands with increased time, Follows one step commands inconsistently      Cueing   Cueing Techniques: Verbal cues, Gestural cues, Tactile cues, Visual cues  Exercises      Shoulder Instructions       General Comments family came in at the end of session    Pertinent Vitals/ Pain       Pain Assessment Pain Assessment:  Faces Faces Pain Scale: Hurts whole lot Breathing: normal Negative Vocalization: occasional moan/groan, low speech, negative/disapproving quality Facial Expression: smiling or inexpressive Body Language: tense, distressed pacing, fidgeting Consolability: no need to console PAINAD Score: 2 Pain Location: general, rectal Pain Descriptors / Indicators: Discomfort, Moaning, Grimacing, Guarding Pain Intervention(s): Limited activity within patient's tolerance, Monitored during session, Repositioned  Home Living                                          Prior Functioning/Environment              Frequency  Min 1X/week        Progress Toward Goals  OT Goals(current goals can now be found in the care plan section)  Progress  towards OT goals: Not progressing toward goals - comment  Acute Rehab OT Goals Patient Stated Goal: to be comfortable OT Goal Formulation: With patient Time For Goal Achievement: 06/18/24 Potential to Achieve Goals: Fair ADL Goals Pt Will Perform Grooming: with supervision;sitting Pt Will Perform Upper Body Bathing: with supervision;sitting Pt Will Perform Lower Body Bathing: with min assist;sit to/from stand Pt Will Perform Upper Body Dressing: with supervision;sitting Pt Will Perform Lower Body Dressing: with mod assist;sitting/lateral leans Pt Will Transfer to Toilet: with mod assist;bedside commode  Plan      Co-evaluation                 AM-PAC OT 6 Clicks Daily Activity     Outcome Measure   Help from another person eating meals?: A Little Help from another person taking care of personal grooming?: A Lot Help from another person toileting, which includes using toliet, bedpan, or urinal?: Total Help from another person bathing (including washing, rinsing, drying)?: Total Help from another person to put on and taking off regular upper body clothing?: A Lot Help from another person to put on and taking off regular lower body clothing?: Total 6 Click Score: 10    End of Session    OT Visit Diagnosis: Unsteadiness on feet (R26.81);Other abnormalities of gait and mobility (R26.89);Repeated falls (R29.6);Muscle weakness (generalized) (M62.81);Pain   Activity Tolerance Patient limited by pain   Patient Left in bed;with call bell/phone within reach;with bed alarm set;with family/visitor present   Nurse Communication Mobility status        Time: 1250-1309 OT Time Calculation (min): 19 min  Charges: OT General Charges $OT Visit: 1 Visit OT Treatments $Self Care/Home Management : 8-22 mins  Warrick POUR OTR/L  Acute Rehab Services  [phone] office number   Warrick Berber 06/18/2024, 12:17 PM

## 2024-06-18 NOTE — Progress Notes (Signed)
 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 This was treated with definitive chemoRT completed in 08/2020. She was followed until 2023, at which time PET was recommended given concern for symptoms indicating a recurrence. The patient was then lost to follow-up.   Currently admitted at [facility] since 05/16/2024. On 9/30, the patient developed vaginal and rectal bleeding. No obvious active bleeding was noted on CT angiography. Yesterday she under went flex sigmoidoscopy with an infiltrative/obstructing mass 16 cm proximal to the anal verge (biopsies taken). Given quantity of post-procedure bleeding, the patient was urgently taken for IR evaluation with embolization of the anterior branch of the left internal iliac artery.  Subjective: She is sleeping. Responding to verbal stimuli. Answered no to question about abdominal pain.   Objective:    06/18/2024    5:00 AM 06/18/2024    4:00 AM 06/18/2024   12:20 AM  Vitals with BMI  Systolic 180 208 816  Diastolic 88 84 84  Pulse  89 73    Resting in bed sleeping. After responding to stimuli, pulled covers over head and no further interaction.   A. RECTUM, BIOPSY:  -  Predominantly fibrinopurulent debris and focal fragments of crushed granulation tissue with dystrophic calcifications.      Latest Ref Rng & Units 06/18/2024    2:03 AM 06/17/2024    7:30 AM 06/16/2024    5:00 AM  CBC  WBC 4.0 - 10.5 K/uL 22.4  23.1  22.5   Hemoglobin 12.0 - 15.0 g/dL 89.7  89.4  89.9   Hematocrit 36.0 - 46.0 % 32.3  32.3  30.6   Platelets 150 - 400 K/uL 196  193  199        Latest Ref Rng & Units 06/18/2024    2:03 AM 06/17/2024    7:30 AM 06/16/2024    5:00 AM  BMP  Glucose 70 - 99 mg/dL 76  69  854   BUN 8 - 23 mg/dL 24  44  36   Creatinine 0.44 - 1.00 mg/dL 6.47  4.80  5.70    Sodium 135 - 145 mmol/L 134  131  132   Potassium 3.5 - 5.1 mmol/L 3.9  4.0  3.7   Chloride 98 - 111 mmol/L 96  97  93   CO2 22 - 32 mmol/L 26  21  24    Calcium  8.9 - 10.3 mg/dL 7.9  8.0  8.1      Assessment/Plan: 68 year old female currently admitted with hx of cervical cancer with large rectal mass and abnormal bleeding. Rectal biopsy from flex sigmoidscopy from 06/13/2024 non-diagnostic. No change from GYN ONC standpoint. Given performance status, pt remains not a candidate for cancer related care and surgery being very high risk. Continue with palliative care management.

## 2024-06-18 NOTE — Progress Notes (Signed)
 Daily Progress Note   Patient Name: Tracy Jennings       Date: 06/18/2024 DOB: [birth year]  Age: 68 y.o. MRN#: [id number] Attending Physician: Jonel Lonni SQUIBB, * Primary Care Physician: Zachary Idelia LABOR, MD Admit Date: 05/16/2024  Reason for Consultation/Follow-up: Establishing goals of care  Subjective: Medical records reviewed including progress notes, labs and imaging. Patient assessed at the bedside.  She initially states she is not sure of any discomfort she may have, then reports 10 out of 10 abdominal pain after repositioning.  Her sister and a family friend are present visiting at the bedside.  Created space and opportunity for patient and family's thoughts and feelings on patient's current illness.  Patient's sister does not have any questions or concerns, neither does she have updates on family's discussions about how to proceed with her care.  She acknowledges that even attempts at therapy are causing patient additional discomfort and distress, however she states that decisions regarding how to proceed are up to patient's son.  She seems to be concerned about patient's current state as well as her overall comfort, informing me that patient needs to be cleaned up again and shaking her head no from out of view of the patient.  Discussed with RN.  I attempted to call patient's son Dijuan, brother [name], and sister [name] for ongoing goals of care discussions but was unable to get in contact with them.  I provided each family member with a voicemail and PMT contact information.  Discussed with primary attending.  Questions and concerns addressed. PMT will continue to support holistically.   Length of Stay: 49   Physical Exam Vitals and nursing note reviewed.  Constitutional:      General: She is awake. She is not in acute  distress.    Appearance: She is ill-appearing.  HENT:     Head: Normocephalic.  Cardiovascular:     Rate and Rhythm: Normal rate.  Pulmonary:     Effort: Pulmonary effort is normal.  Neurological:     Mental Status: She is alert.  Psychiatric:        Behavior: Behavior is withdrawn.            Vital Signs: BP (!) 180/88   Pulse 89   Temp 97.8 F (36.6 C) (Oral)   Resp 17   Ht 5' 3 (1.6 m)   Wt 51.1 kg   LMP  (LMP Unknown)   SpO2 98%   BMI 19.96 kg/m  SpO2: SpO2: 98 % O2 Device: O2 Device: Room Air O2 Flow Rate:        Palliative Assessment/Data: 20%   Palliative Care Assessment & Plan   Patient Profile: 69 y.o. female  with past medical history of cognitive impairment, ESRD on HD MWF, cervical cancer, HTN, HLD, anemia and recent hospitalizations from 6/24-7/22 for hypertensive urgency and delirium, and 8/17-8/24 for C. difficile colitis for which she was treated with p.o. vancomycin   admitted on 05/16/2024 with weakness, nausea, diarrhea and abdominal discomfort. CT abdomen and pelvis showed circumferential wall thickening of rectosigmoid colon suspicious for colitis, Colo uterine fistula, increased bilateral pleural effusions, left > right and area of rounded density in anterior LLL measuring 3.1  cm, and progressive anasarca.  General surgery at [facility] was consulted and recommended higher level of care and the ER physician discussed with Dr. Dasie general surgeon at [facility] who agreed to see patient in consult. Patient was started on Dificid  and Flagyl  for C. difficile colitis.  Evaluated by general surgery - urgent surgical intervention is not indicated. PMT consulted to discuss GOC.    Assessment: Goals of care conversation FTT Dementia ESRD on HD Acute metabolic encephalopathy superimposed on dementia, improving Colouterine fistula Concern for malignant mass at colouterine fistula   Recommendations/Plan: Continue full code/full scope treatment for now Unable to  get in contact with patient's primary decision makers today.  Voicemails were left with request for return call to continue goals of care discussions Will attempt to discuss boundaries/limitations and plan for how to proceed after tomorrow's dialysis based on how she tolerates this.  Patient would likely benefit from transition to comfort focused care Psychosocial and emotional support provided PMT will continue to follow and support   Prognosis:  Poor long-term prognosis  Discharge Planning: To Be Determined   Care plan was discussed with Patient, patient's sister, RN, MD          Mickle SHAUNNA Fell, PA-C  Palliative Medicine Team Team phone # [phone]  Thank you for allowing the Palliative Medicine Team to assist in the care of this patient. Please utilize secure chat with additional questions, if there is no response within 30 minutes please call the above phone number.  Palliative Medicine Team providers are available by phone from 7am to 7pm daily and can be reached through the team cell phone.  Should this patient require assistance outside of these hours, please call the patient's attending physician.   Time Total: 35  Visit consisted of counseling and education dealing with the complex and emotionally intense issues of symptom management and palliative care in the setting of serious and potentially life-threatening illness. Greater than 50% of this time was spent counseling and coordinating care related to the above assessment and plan.  Personally spent 35 minutes in patient care including extensive chart review (labs, imaging, progress/consult notes, vital signs), medically appropraite exam, discussed with treatment team, education to patient, family, and staff, documenting clinical information, medication review and management, coordination of care, and available advanced directive documents.

## 2024-06-19 DIAGNOSIS — Z66 Do not resuscitate: Secondary | ICD-10-CM

## 2024-06-19 DIAGNOSIS — Z789 Other specified health status: Secondary | ICD-10-CM

## 2024-06-19 DIAGNOSIS — N824 Other female intestinal-genital tract fistulae: Secondary | ICD-10-CM | POA: Diagnosis not present

## 2024-06-19 LAB — CBC WITH DIFFERENTIAL/PLATELET
Abs Immature Granulocytes: 0.14 K/uL — ABNORMAL HIGH (ref 0.00–0.07)
Basophils Absolute: 0 K/uL (ref 0.0–0.1)
Basophils Relative: 0 %
Eosinophils Absolute: 0.2 K/uL (ref 0.0–0.5)
Eosinophils Relative: 1 %
HCT: 33.2 % — ABNORMAL LOW (ref 36.0–46.0)
Hemoglobin: 10.6 g/dL — ABNORMAL LOW (ref 12.0–15.0)
Immature Granulocytes: 1 %
Lymphocytes Relative: 3 %
Lymphs Abs: 0.5 K/uL — ABNORMAL LOW (ref 0.7–4.0)
MCH: 30 pg (ref 26.0–34.0)
MCHC: 31.9 g/dL (ref 30.0–36.0)
MCV: 94.1 fL (ref 80.0–100.0)
Monocytes Absolute: 1.5 K/uL — ABNORMAL HIGH (ref 0.1–1.0)
Monocytes Relative: 8 %
Neutro Abs: 15.4 K/uL — ABNORMAL HIGH (ref 1.7–7.7)
Neutrophils Relative %: 87 %
Platelets: 221 K/uL (ref 150–400)
RBC: 3.53 MIL/uL — ABNORMAL LOW (ref 3.87–5.11)
RDW: 18.1 % — ABNORMAL HIGH (ref 11.5–15.5)
WBC: 17.7 K/uL — ABNORMAL HIGH (ref 4.0–10.5)
nRBC: 0 % (ref 0.0–0.2)

## 2024-06-19 LAB — GLUCOSE, CAPILLARY
Glucose-Capillary: 71 mg/dL (ref 70–99)
Glucose-Capillary: 72 mg/dL (ref 70–99)

## 2024-06-19 MED ORDER — LORAZEPAM 2 MG/ML PO CONC: 1.0000 mg | ORAL | Status: DC | PRN

## 2024-06-19 MED ORDER — ONDANSETRON HCL 4 MG/2ML IJ SOLN: 4.0000 mg | Freq: Four times a day (QID) | INTRAMUSCULAR | Status: DC | PRN

## 2024-06-19 MED ORDER — GLYCOPYRROLATE 0.2 MG/ML IJ SOLN: 0.2000 mg | INTRAMUSCULAR | Status: DC | PRN

## 2024-06-19 MED ORDER — HALOPERIDOL 0.5 MG PO TABS: 0.5000 mg | ORAL_TABLET | ORAL | Status: DC | PRN

## 2024-06-19 MED ORDER — HALOPERIDOL LACTATE 2 MG/ML PO CONC: 0.5000 mg | ORAL | Status: DC | PRN

## 2024-06-19 MED ORDER — ONDANSETRON 4 MG PO TBDP
4.0000 mg | ORAL_TABLET | Freq: Four times a day (QID) | ORAL | Status: DC | PRN
  Administered 2024-06-22 – 2024-06-23 (×2): 4 mg via ORAL
  Filled 2024-06-19 (×2): qty 1

## 2024-06-19 MED ORDER — POLYVINYL ALCOHOL 1.4 % OP SOLN: 1.0000 [drp] | Freq: Four times a day (QID) | OPHTHALMIC | Status: DC | PRN

## 2024-06-19 MED ORDER — GLYCOPYRROLATE 1 MG PO TABS: 1.0000 mg | ORAL_TABLET | ORAL | Status: DC | PRN

## 2024-06-19 MED ORDER — HYDROMORPHONE HCL 1 MG/ML IJ SOLN
0.5000 mg | INTRAMUSCULAR | Status: DC | PRN
  Administered 2024-06-19: 2 mg via INTRAVENOUS
  Administered 2024-06-20 – 2024-06-25 (×10): 1 mg via INTRAVENOUS
  Filled 2024-06-19 (×4): qty 1
  Filled 2024-06-19: qty 2
  Filled 2024-06-19 (×5): qty 1
  Filled 2024-06-19: qty 2

## 2024-06-19 MED ORDER — LABETALOL HCL 5 MG/ML IV SOLN
INTRAVENOUS | Status: AC
  Filled 2024-06-19: qty 4

## 2024-06-19 MED ORDER — HALOPERIDOL LACTATE 5 MG/ML IJ SOLN: 0.5000 mg | INTRAMUSCULAR | Status: DC | PRN

## 2024-06-19 MED ORDER — LORAZEPAM 1 MG PO TABS
1.0000 mg | ORAL_TABLET | ORAL | Status: DC | PRN
  Administered 2024-06-20: 1 mg via ORAL
  Filled 2024-06-19: qty 1

## 2024-06-19 MED ORDER — LORAZEPAM 2 MG/ML IJ SOLN
1.0000 mg | INTRAMUSCULAR | Status: DC | PRN
  Administered 2024-06-25: 1 mg via INTRAVENOUS
  Filled 2024-06-19 (×2): qty 1

## 2024-06-19 NOTE — Progress Notes (Signed)
 Daily Progress Note   Date: 06/19/2024   Patient Name: Tracy Jennings  DOB: 1956/02/09  MRN: [id number]  Age / Sex: 68 y.o., female  Attending Physician: Leotis Bogus, MD Primary Care Physician: Zachary Idelia LABOR, MD Admit Date: 05/16/2024 Length of Stay: 34 days  Reason for Follow-up: {Reason for Consult:23484}  Past Medical History:  Diagnosis Date   Anemia 04/2018   low iron. to be started on supplements   Cervical cancer (HCC)    CKD (chronic kidney disease)    Stage IV   Complication of anesthesia    receceived too much anesthesia, that she was in coma for a couple days    COVID-19 virus detected 10/28/2019   Diabetes mellitus without complication (HCC)    type II   ESRD (end stage renal disease) (HCC)    Heart murmur    followed as a child only   HSIL (high grade squamous intraepithelial lesion) on Pap smear of cervix    Hyperlipidemia associated with type 2 diabetes mellitus (HCC)    Hypertension    Pancreatitis    Peripheral vascular disease     Subjective:   Subjective: Chart Reviewed. Updates received. Patient Assessed. Created space and opportunity for patient  and family to explore thoughts and feelings regarding current medical situation.  Today's Discussion: Today before meeting with the patient/family, I reviewed the chart notes including ***. I also reviewed vital signs, nursing flowsheets, medication administrations record, labs, and imaging. Labs reviewed include ***.  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 difficile colitis  PVD (peripheral vascular disease)  Protein-calorie malnutrition, severe   Vital Signs:  BP (!) 187/71   Pulse  86   Temp (!) 97 F (36.1 C)   Resp 12   Ht 5' 3 (1.6 m)   Wt 48.3 kg   LMP  (LMP Unknown)   SpO2 100%   BMI 18.86 kg/m   Physical Exam  Palliative Assessment/Data: ***   Existing Vynca/ACP Documentation: ***  Advanced Care Planning:   Existing Vynca/ACP Documentation: ***  Primary Decision Maker: {Primary Decision Fjxzm:78612}  Pertinent diagnosis: ***  The patient and/or family consented to a voluntary Advance Care Planning Conversation in person/over the phone***. Individuals present for the conversation: ***  Summary of the conversation: ***  Outcome of the conversations and/or documents completed: ***  I spent *** minutes providing separately identifiable ACP services with the patient and/or surrogate decision maker in a voluntary, in-person conversation discussing the patient's wishes and goals as detailed in the above note.  Assessment & Plan:   HPI/Patient Profile:  ***  SUMMARY OF RECOMMENDATIONS   ***  Symptom Management:  ***  Code Status: {Updated Palliative Code Status:33307}  Prognosis: {Palliative Care Prognosis:23504}  Discharge Planning: {Palliative dispostion:23505}  Discussed with: ***  Thank you for allowing us  to participate in the care of Tara D Skousen PMT will continue to support holistically.  Time Total: ***  Detailed review of medical records (labs, imaging, vital signs), medically appropriate exam, discussed with treatment team, counseling and education to patient, family, & staff, documenting clinical information, medication management, coordination of care  [name], NP Palliative Medicine Team  Team Phone # [phone] (Nights/Weekends)  05/11/2021, 8:17  AM

## 2024-06-19 NOTE — Progress Notes (Signed)
   06/19/24 1011  Vitals  Temp (!) 97 F (36.1 C)  Pulse Rate 86  Resp 12  BP (!) 187/71  SpO2 100 %  O2 Device Room Air  Oxygen Therapy  Patient Activity (if Appropriate) In bed  Pulse Oximetry Type Continuous  Oximetry Probe Site Changed No  Post Treatment  Dialyzer Clearance Lightly streaked  Hemodialysis Intake (mL) 0 mL  Liters Processed 36.9  Fluid Removed (mL) 300 mL  Tolerated HD Treatment Yes  AVG/AVF Arterial Site Held (minutes) 5 minutes  AVG/AVF Venous Site Held (minutes) 5 minutes   Received patient in bed to unit.  Alert Orient, self, place  Informed consent signed and in chart.   TX duration: 3  Patient Hemodialysis Discontinued Per. MD Gearline, see Provider Notification  Transported back to the room  Alert, Patient confused  Hand-off given to patient's nurse.   Access used: Yes Access issues: No  Total UF removed: 300 Medication(s) given: See MAR Post HD VS: See Above Grid Post HD weight: 50.5 kg   Zebedee DELENA [facility]

## 2024-06-19 NOTE — Progress Notes (Signed)
 Placed order for IV team blood draw from port per lab

## 2024-06-19 NOTE — Progress Notes (Signed)
 PROGRESS NOTE    Tracy Jennings  FMW:[id number] DOB: [birth year] DOA: 05/16/2024 PCP: Zachary Idelia LABOR, MD    Brief Narrative:  This 68 yrs old Female with ESRD on HD MWF, Cervical cancer lost to follow up, HTN, and cognitive impairment who presented with stool from vagina, found to have colo-uterine fistula. Initial plan after general surgery evaluation was discharge with outpatient follow-up for surgical resection of fistula, but it became clear that the patient's functional status had declined so much that she was too weak to sit up in a chair for dialysis, and her hospitalization was prolonged. She subsequently then developed bleeding from her colo-uterine fistula, and GI were consulted. On flexible sigmoidoscopy, she was found to have a large malignant appearing mass at the site of the fistula. Post-sigmoidoscopy She developed heavy bleeding and was taken for embolization. Post-embolization, biopsy inconclusive, but patient's functional status continues to decline.   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 -Due to weakness, failure to thrive, patient unable to progress to discharge (couldn't sit up in chair for HD) and eventually developed DVT, She was  started on Eliquis , and started to have rectal/vaginal bleeding.   -GI consulted, flexible sigmoidoscopy performed on 10/2, and found to have malignant appearing mass at fistula.   -Biopsy led to brisk bleeding, requiring emergency IR embolization of anterior division of left internal iliac artery.   -Bleeding abated and has been stable since then. -Biopsy with debris only, no cellular material.  Immunohistochemical stains still pending.   # There is broad consensus from general surgery, GYN oncology that the patient is not an operative candidate and her fistula is incurable. There is also high clinical suspicion that this is cancer, but that biopsy nor chemo are feasible given her overall malnutrition. and functional status. - Continue to engage family regarding goals of care.     History of cervical clear cell adenoCA: Masslike consolidation of the left lung: Pelvic lymphadenopathy Failure to thrive: As noted previously, the patient underwent chemotherapy and radiation as well as brachytherapy in 2021 for her unusual cervical cancer.   In 2023, GYN oncology notes imply that she had some thickening of the colon and pelvic lymphadenopathy and a PET scan and follow-up imaging was planned, but does not appear to ever been completed and unfortunately no follow-up since then.   Biopsy inconclusive, but strongly clinically suspect cancer. Continues to struffle to stay awake, to eat, and has a high symptom burden with stool from vagina, abdominal pain.   Yesterday's dialysis was halted early.Today HD stopped and Nephrology states, She is not a candidate for HD.   Shared frankly with family Sunday and Monday that there are no curative options left, and each week in the hospital has seen her weaker and weaker.  Family wish to  observe another dialysis session.  Paitent no longer able to participate in goals of care discussions, decision makers are her son [name] and sister [name].   Acute metabolic  encephalopathy: Dementia At baseline, patient interactive and lives at home, familiar with surroundings but with memory loss.  Here, she has been alert but oriented only to self for most of hospital stay.  Since flex sig on 10/2, she is less responsive, somnolent, due to blood loss, malnutrition primarily.  No focal signs but exam limited due to somnolence.     Acute blood loss anemia due to fistula on chronic anemia of CKD: Baseline 9-10.  Required 2 units transfusion early in hospital stay.   Then transfused 2u PRBC 10/1 and another 2u post-embo on 10/2, stable since.    - Trend hemoglobin. Remains stable above 10.6   ESRD (end stage renal disease) [facility]) - Nephrology following, hemodialysis stopped due to patient agitation. - Nephrology states patient is not a candidate for further hemodialysis.    Acute right internal jugular DVT She has had life-threatening bleeding. Risk of anticoagulation outweighs benefit at this time. - Monitor arm clinically.   PVD (peripheral vascular disease): - Hold aspirin  and Lipitor in setting of poor oral intake   Pleural effusion: IR previously consulted, not enough fluid to tap.   C. difficile colitis: Resolved   Protein-calorie malnutrition, severe: Failure to thrive: Failed trial of tube feeds earlier in this hospital stay due to worsening of diarrhea.   Not a candidate for TPN. - Consult Palliative care. - Consult Dietitian.   Essential hypertension: BP measurements may or may not be accurate, patient lies on side and due to her terminal state, is hard to reposition for accurate BP measurements.    - Continue clonidine  patch, losartan , spironolactone . - IV hydralazine  PRN and labetalol  scheduled   Type 2 diabetes mellitus with other specified complication (HCC) Glucose low due to FTT. - Stop SSI - Accuchecks once daily   DVT prophylaxis: SCDS Code Status: Full code Family Communication: No family at bed side Disposition Plan:    Status is: Inpatient Remains inpatient appropriate because: Severity of illness    Consultants:  Nephrology Vascular surgery Palliative care  Procedures:  Antimicrobials:  Anti-infectives (From admission, onward)    Start     Dose/Rate Route Frequency Ordered Stop   06/17/24 1800  vancomycin  (VANCOREADY) IVPB 500 mg/100 mL  Status:  Discontinued        500 mg 100 mL/hr over 60 Minutes Intravenous Every M-W-F (Hemodialysis) 06/15/24 0738 06/17/24 1549   06/16/24 1800  ceFEPIme  (MAXIPIME ) 1 g in sodium chloride  0.9 % 100 mL IVPB  Status:  Discontinued        1 g 200 mL/hr over 30 Minutes Intravenous Every evening 06/15/24 0736 06/17/24 1549   06/15/24 0830  vancomycin  (VANCOREADY) IVPB 750 mg/150 mL        750 mg 150 mL/hr over 60 Minutes Intravenous  Once 06/15/24 0735 06/15/24 1513   06/15/24 0745  ceFEPIme  (MAXIPIME ) 1 g in sodium chloride  0.9 % 100 mL IVPB        1 g 200 mL/hr over 30 Minutes Intravenous Once 06/15/24 0735 06/15/24 0902   06/04/24 1215  vancomycin  (VANCOCIN ) capsule 125 mg        125 mg Oral Daily 06/04/24 1116 06/05/24 0621   05/27/24 1000  vancomycin  (VANCOCIN ) capsule 125 mg        125 mg Oral Daily 05/21/24 1019 06/04/24 0959   05/20/24  1530  amoxicillin -clavulanate (AUGMENTIN ) 500-125 MG per tablet 1 tablet        1 tablet Oral Every 12 hours 05/20/24 1519 05/29/24 2152   05/17/24 0100  fidaxomicin  (DIFICID ) tablet 200 mg        200 mg Oral 2 times daily 05/17/24 0003 05/26/24 1037   05/17/24 0100  metroNIDAZOLE  (FLAGYL ) IVPB 500 mg  Status:  Discontinued        500 mg 100 mL/hr over 60 Minutes Intravenous Every 12 hours 05/17/24 0003 05/20/24 1519        Subjective: Patient was seen in the hemodialysis unit.  Patient appears agitated and trying to move a lot.   Discussed with nephrology at the hemodialysis unit.  Patient is not a candidate for further hemodialysis. Patient complains of any severe pain despite receiving pain medicine before  dialysis.  Objective: Vitals:   06/19/24 0900 06/19/24 0930 06/19/24 1010 06/19/24 1011  BP: (!) 191/62 (!) 169/58 (!) 150/79 (!) 187/71  Pulse: 80 75 86 86  Resp: 14 10 12 12   Temp:    (!) 97 F (36.1 C)  TempSrc:      SpO2: 98% 97% 100% 100%  Weight:      Height:        Intake/Output Summary (Last 24 hours) at 06/19/2024 1107 Last data filed at 06/19/2024 1011 Gross per 24 hour  Intake 150 ml  Output 300 ml  Net -150 ml   Filed Weights   06/14/24 1224 06/17/24 0810 06/19/24 0806  Weight: 52.2 kg 51.1 kg 48.3 kg    Examination:  General exam: Appears calm and comfortable, deconditioned, not in any acute distress. Respiratory system: Decreased breath sounds, respiratory effort normal.  RR 14 Cardiovascular system: S1 & S2 heard, RRR. No JVD, murmurs, rubs, gallops or clicks. No pedal edema. Gastrointestinal system: Abdomen is non distended, soft and nontender.  Normal bowel sounds heard. Central nervous system: Alert and oriented x1. No focal neurological deficits. Extremities: No edema , no cyanosis, no clubbing. Skin: No rashes, lesions or ulcers Psychiatry: Judgement and insight appear normal. Mood & affect appropriate.     Data Reviewed: I have personally reviewed following labs and imaging studies  CBC: Recent Labs  Lab 06/15/24 0500 06/16/24 0500 06/17/24 0730 06/18/24 0203 06/19/24 0643  WBC 24.2* 22.5* 23.1* 22.4* 17.7*  NEUTROABS  --   --   --   --  15.4*  HGB 10.6* 10.0* 10.5* 10.2* 10.6*  HCT 32.1* 30.6* 32.3* 32.3* 33.2*  MCV 90.2 91.6 92.0 95.6 94.1  PLT 203 199 193 196 221   Basic Metabolic Panel: Recent Labs  Lab 06/14/24 0922 06/15/24 0500 06/16/24 0500 06/17/24 0730 06/18/24 0203  NA 136 136 132* 131* 134*  K 5.2* 4.0 3.7 4.0 3.9  CL 100 97* 93* 97* 96*  CO2 21* 25 24 21* 26  GLUCOSE 111* 72 145* 69* 76  BUN 49* 28* 36* 44* 24*  CREATININE 5.29* 3.53* 4.29* 5.19* 3.52*  CALCIUM  8.5* 8.5* 8.1* 8.0* 7.9*  PHOS  --   --   --  4.8*   --    GFR: Estimated Creatinine Clearance: 12 mL/min (A) (by C-G formula based on SCr of 3.52 mg/dL (H)). Liver Function Tests: Recent Labs  Lab 06/13/24 0355 06/14/24 0922 06/16/24 0500 06/17/24 0730 06/18/24 0203  AST 13* 12* 15  --  14*  ALT 10 9 10   --  10  ALKPHOS 51 43 55  --  46  BILITOT 0.5  1.3* 0.7  --  1.0  PROT 5.8* 5.7* 5.4*  --  5.4*  ALBUMIN 1.8* 2.2* 1.9* 1.8* 1.9*   No results for input(s): LIPASE, AMYLASE in the last 168 hours. No results for input(s): AMMONIA in the last 168 hours. Coagulation Profile: No results for input(s): INR, PROTIME in the last 168 hours. Cardiac Enzymes: No results for input(s): CKTOTAL, CKMB, CKMBINDEX, TROPONINI in the last 168 hours. BNP (last 3 results) No results for input(s): PROBNP in the last 8760 hours. HbA1C: No results for input(s): HGBA1C in the last 72 hours. CBG: Recent Labs  Lab 06/17/24 1434 06/17/24 1801 06/18/24 0400 06/19/24 0601 06/19/24 0656  GLUCAP 96 89 67* 71 72   Lipid Profile: No results for input(s): CHOL, HDL, LDLCALC, TRIG, CHOLHDL, LDLDIRECT in the last 72 hours. Thyroid Function Tests: No results for input(s): TSH, T4TOTAL, FREET4, T3FREE, THYROIDAB in the last 72 hours. Anemia Panel: Recent Labs    06/18/24 0203  FERRITIN 4,277*  TIBC NOT CALCULATED  IRON 35   Sepsis Labs: No results for input(s): PROCALCITON, LATICACIDVEN in the last 168 hours.  Recent Results (from the past 240 hours)  Culture, blood (Routine X 2) w Reflex to ID Panel     Status: None (Preliminary result)   Collection Time: 06/15/24  1:10 PM   Specimen: BLOOD RIGHT HAND  Result Value Ref Range Status   Specimen Description BLOOD RIGHT HAND  Final   Special Requests   Final    BOTTLES DRAWN AEROBIC AND ANAEROBIC Blood Culture adequate volume   Culture   Final    NO GROWTH 4 DAYS Performed at [facility], [address] [place], [place] [address]    Report  Status PENDING  Incomplete  Culture, blood (Routine X 2) w Reflex to ID Panel     Status: None (Preliminary result)   Collection Time: 06/15/24  2:14 PM   Specimen: BLOOD RIGHT ARM  Result Value Ref Range Status   Specimen Description BLOOD RIGHT ARM  Final   Special Requests   Final    BOTTLES DRAWN AEROBIC ONLY Blood Culture results may not be optimal due to an inadequate volume of blood received in culture bottles   Culture   Final    NO GROWTH 4 DAYS Performed at [facility], [address] [place], [place] [address]    Report Status PENDING  Incomplete    Radiology Studies: No results found.  Scheduled Meds:  (feeding supplement) PROSource Plus  30 mL Oral BID BM   Chlorhexidine  Gluconate Cloth  6 each Topical Q0600   [START ON 06/23/2024] cloNIDine   0.3 mg Transdermal Q Sun   Gerhardt's butt cream   Topical QID   labetalol   10 mg Intravenous Q6H   losartan   100 mg Oral Daily   sodium chloride  flush  10-40 mL Intracatheter Q12H   spironolactone   25 mg Oral Daily   Continuous Infusions:   LOS: 34 days    Time spent: 50 mins.    Darcel Dawley, MD [facility]   If 7PM-7AM, please contact night-coverage

## 2024-06-19 NOTE — Progress Notes (Signed)
 Nutrition Brief Note  Chart reviewed. Pt now transitioning to comfort care.  No further nutrition interventions planned at this time.  Please re-consult as needed.   Olivia Kenning, RD Registered Dietitian  See Amion for more information

## 2024-06-19 NOTE — Progress Notes (Signed)
 Report given to Dialysis RN. Reported last BP and CBG. This RN encouraged PO liquids and was able to get about 3 spoons of thicken OJ. BP is elevated and RN states she will monitor and give labetalol  if needed.

## 2024-06-19 NOTE — Progress Notes (Signed)
Pt transported to HD via bed by transportation staff.  

## 2024-06-19 NOTE — Progress Notes (Signed)
 Pt arrived back to 6 north room 3 alert to self. Family at bedside. Bed in lowest position. Call light in reach. Flor mats down. All needs met at this time

## 2024-06-19 NOTE — Progress Notes (Signed)
 [facility] Progress Note   Subjective:     Agitated, crying out in pain, switches between sleeping and screaming.    Objective Vitals:   06/19/24 0900 06/19/24 0930 06/19/24 1010 06/19/24 1011  BP: (!) 191/62 (!) 169/58 (!) 150/79 (!) 187/71  Pulse: 80 75 86 86  Resp: 14 10 12 12   Temp:    (!) 97 F (36.1 C)  TempSrc:      SpO2: 98% 97% 100% 100%  Weight:      Height:       Physical Exam General:somnolent, frail, nad  Heart: RRR Lungs: CTAB, nml WOB on RA Abdomen: soft, non distended Extremities: no LE edema Dialysis Access: LU AVG +b/t   Filed Weights   06/14/24 1224 06/17/24 0810 06/19/24 0806  Weight: 52.2 kg 51.1 kg 48.3 kg    Intake/Output Summary (Last 24 hours) at 06/19/2024 1102 Last data filed at 06/19/2024 1011 Gross per 24 hour  Intake 150 ml  Output 300 ml  Net -150 ml    Additional Objective Labs: Basic Metabolic Panel: Recent Labs  Lab 06/16/24 0500 06/17/24 0730 06/18/24 0203  NA 132* 131* 134*  K 3.7 4.0 3.9  CL 93* 97* 96*  CO2 24 21* 26  GLUCOSE 145* 69* 76  BUN 36* 44* 24*  CREATININE 4.29* 5.19* 3.52*  CALCIUM  8.1* 8.0* 7.9*  PHOS  --  4.8*  --    Liver Function Tests: Recent Labs  Lab 06/14/24 0922 06/16/24 0500 06/17/24 0730 06/18/24 0203  AST 12* 15  --  14*  ALT 9 10  --  10  ALKPHOS 43 55  --  46  BILITOT 1.3* 0.7  --  1.0  PROT 5.7* 5.4*  --  5.4*  ALBUMIN 2.2* 1.9* 1.8* 1.9*   No results for input(s): LIPASE, AMYLASE in the last 168 hours. CBC: Recent Labs  Lab 06/15/24 0500 06/16/24 0500 06/17/24 0730 06/18/24 0203 06/19/24 0643  WBC 24.2* 22.5* 23.1* 22.4* 17.7*  NEUTROABS  --   --   --   --  15.4*  HGB 10.6* 10.0* 10.5* 10.2* 10.6*  HCT 32.1* 30.6* 32.3* 32.3* 33.2*  MCV 90.2 91.6 92.0 95.6 94.1  PLT 203 199 193 196 221   Blood Culture    Component Value Date/Time   SDES BLOOD RIGHT ARM 06/15/2024 1414   SPECREQUEST  06/15/2024 1414    BOTTLES DRAWN AEROBIC ONLY Blood  Culture results may not be optimal due to an inadequate volume of blood received in culture bottles   CULT  06/15/2024 1414    NO GROWTH 4 DAYS Performed at [facility], [address] [place], [place] [address]    REPTSTATUS PENDING 06/15/2024 1414    Cardiac Enzymes: No results for input(s): CKTOTAL, CKMB, CKMBINDEX, TROPONINI in the last 168 hours. CBG: Recent Labs  Lab 06/17/24 1434 06/17/24 1801 06/18/24 0400 06/19/24 0601 06/19/24 0656  GLUCAP 96 89 67* 71 72   Iron Studies:  Recent Labs    06/18/24 0203  IRON 35  TIBC NOT CALCULATED  FERRITIN 4,277*   Lab Results  Component Value Date   INR 1.3 (H) 03/05/2024   INR 1.1 11/03/2019   INR 1.04 05/12/2018   Studies/Results: No results found.  Medications:   (feeding supplement) PROSource Plus  30 mL Oral BID BM   Chlorhexidine  Gluconate Cloth  6 each Topical Q0600   [START ON 06/23/2024] cloNIDine   0.3 mg Transdermal Q Sun   Gerhardt's butt cream  Topical QID   labetalol   10 mg Intravenous Q6H   losartan   100 mg Oral Daily   sodium chloride  flush  10-40 mL Intracatheter Q12H   spironolactone   25 mg Oral Daily    Dialysis Orders: MWF [facility]  3.5hrs, 400/500, EDW 53kg, 2K/2.5Ca bath, AVG, heparin  6000 unit bolus + 400u/hr pump - Calcitriol  1.58mcg with HD - Sensipar 60mg  with HD - Micera 75mcg Q 4 weeks - per records, dose just ordered (not given) - Binder: Calcium  Acetate 667mh TID with meals  Assessment/Plan: Colo-uterine fistula: per abd CT. Initial plans for outpt management; Surgery/GI consulted 9/30 for new onset rectal/vaginal bleeding from fistula. Subsequently found to have large rectal mass suggesting malignancy; biopsy non diagnostic. Not a surgical candidate. IV antibiotics started. Palliative care involved for GOC discussions C-diff colitis: s/p course of po vancomycin .  ESRD: on HD MWF. Next HD 10/8.  Agitated and screaming on dialysis. She cannot keep her  access arm still and there is significant concern re: pulling needles.  She is no longer an appropriate dialysis candidate.  I appreciate palliative care.    HTN/volume:  BP remains elevated. Continue UF with HD as able. On IV meds due to somnolence.  Anemia of ESRD + ABLA.  Hgb 10.0  s/p 4 u prbcs this admission, last 10/2.  Hold ESA for now.  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 Dementia Malnutrition: NG tube placed, TF started 9/10 - stopped 9/14 (exacerbated diarrhea), NG tube now out.  GOC: Palliative care is following and family has been wanting full scope of care. She is not tolerating dialysis- agitated, screaming, safety concern.  Doubt that this is contributing to QOL.     Almarie Bonine MD [facility] 06/19/2024,11:02 AM  LOS: 34 days

## 2024-06-19 NOTE — Procedures (Signed)
 Patient seen and examined on Hemodialysis. The procedure was supervised and I have made appropriate changes. BP (!) 187/71   Pulse 86   Temp (!) 97 F (36.1 C)   Resp 12   Ht 5' 3 (1.6 m)   Wt 48.3 kg   LMP  (LMP Unknown)   SpO2 100%   BMI 18.86 kg/m   QB 400 mL/min, UF goal even.  Agitated and screaming.  Concern for needle dislodgement.  Treatment terminated early per my orders.  Almarie Bonine MD [facility] [phone] 11:05 AM

## 2024-06-20 DIAGNOSIS — N824 Other female intestinal-genital tract fistulae: Secondary | ICD-10-CM | POA: Diagnosis not present

## 2024-06-20 LAB — BASIC METABOLIC PANEL WITH GFR
Anion gap: 12 (ref 5–15)
BUN: 30 mg/dL — ABNORMAL HIGH (ref 8–23)
CO2: 27 mmol/L (ref 22–32)
Calcium: 7.9 mg/dL — ABNORMAL LOW (ref 8.9–10.3)
Chloride: 97 mmol/L — ABNORMAL LOW (ref 98–111)
Creatinine, Ser: 3.86 mg/dL — ABNORMAL HIGH (ref 0.44–1.00)
GFR, Estimated: 12 mL/min — ABNORMAL LOW (ref >60)
Glucose, Bld: 79 mg/dL (ref 70–99)
Potassium: 3.8 mmol/L (ref 3.5–5.1)
Sodium: 136 mmol/L (ref 135–145)

## 2024-06-20 LAB — CULTURE, BLOOD (ROUTINE X 2)
Culture: NO GROWTH
Culture: NO GROWTH
Special Requests: ADEQUATE

## 2024-06-20 MED ORDER — LABETALOL HCL 5 MG/ML IV SOLN: 10.0000 mg | Freq: Once | INTRAVENOUS | Status: DC

## 2024-06-20 NOTE — Progress Notes (Addendum)
 IV Team requested to bedside due to pt removing her PAC needle.  Pt noted to be comfort care in need of IV medications. This RN to bedside, PAC site noted to be deaccessed, clean, dry and intact.  Pt curled into fetal position.  Assisted pt into better positioning for [facility] access.  When cleaning, pt restless and contaminating sterile field. Site cleaned once again, using sterile technique and when this RN attempted to access port, pt pulled away pushing at my hands. Attempt aborted to prevent contamination and injury to pt and myself.  Pt's RN requested to bedside and with assistance from primary RN, port accessed successfully.  Would consider not re-accessing again if pt were to inadvertently remove needle again for patient and staff safety.

## 2024-06-20 NOTE — Progress Notes (Signed)
 PROGRESS NOTE    Tracy Jennings  FMW:[id number] DOB: 1955/11/02 DOA: 05/16/2024 PCP: Zachary Idelia LABOR, MD    Brief Narrative:  This 68 yrs old Female with ESRD on HD MWF, Cervical cancer lost to follow up, HTN, and cognitive impairment who presented with stool from vagina, found to have colo-uterine fistula. Initial plan after general surgery evaluation was discharge with outpatient follow-up for surgical resection of fistula, but it became clear that the patient's functional status had declined so much that she was too weak to sit up in a chair for dialysis, and her hospitalization was prolonged. She subsequently then developed bleeding from her colo-uterine fistula, and GI were consulted. On flexible sigmoidoscopy, she was found to have a large malignant appearing mass at the site of the fistula. Post-sigmoidoscopy She developed heavy bleeding and was taken for embolization. Post-embolization, biopsy inconclusive, but patient's functional status continues to decline. Now medical care transitioned to full comfort care.   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 -Due to weakness, failure to thrive, patient unable to progress to discharge (couldn't sit up in  chair for HD) and eventually developed DVT, She was started on Eliquis , and started to have rectal/vaginal bleeding.   -GI consulted, flexible sigmoidoscopy performed on 10/2, and found to have malignant appearing mass at fistula.   -Biopsy led to brisk bleeding, requiring emergency IR embolization of anterior division of left internal iliac artery.   -Bleeding abated and has been stable since then. -Biopsy with debris only, no cellular material.  Immunohistochemical stains still pending.   # There is broad consensus from general surgery, GYN oncology that the patient is not an operative candidate and her fistula is incurable. There is also high clinical suspicion that this is cancer, but that biopsy nor chemo are feasible given her overall malnutrition. and functional status. -Continue to engage family regarding goals of care. - Now medical care is transitioned to full comfort care.     History of cervical clear cell adenoCA: Masslike consolidation of the left lung: Pelvic lymphadenopathy Failure to thrive: As noted previously, the patient underwent chemotherapy and radiation as well as brachytherapy in 2021 for her unusual cervical cancer.   In 2023, GYN oncology notes imply that she had some thickening of the colon and pelvic lymphadenopathy and a PET scan and follow-up imaging was planned, but does not appear to ever been completed and unfortunately no follow-up since then.   Biopsy inconclusive, but strongly clinically suspect cancer. Continues to struffle to stay awake, to eat, and has a high symptom burden with stool from vagina, abdominal pain.   Yesterday's dialysis was halted early.Today HD stopped and Nephrology states, She is not a candidate for HD.   Shared frankly with family Sunday and Monday that there are no curative options  left, and each week in the hospital has seen her weaker and weaker.  Family wish to observe another dialysis session.  Paitent no longer able to  participate in goals of care discussions, decision makers are her son [name] and sister [name].   Acute metabolic encephalopathy: Dementia At baseline, patient interactive and lives at home, familiar with surroundings but with memory loss.  Here, she has been alert but oriented only to self for most of hospital stay.  Since flex sig on 10/2, she is less responsive, somnolent, due to blood loss, malnutrition primarily.  No focal signs but exam limited due to somnolence.   Acute blood loss anemia due to fistula on chronic anemia of CKD: Baseline 9-10.  Required 2 units transfusion early in hospital stay.   Then transfused 2u PRBC 10/1 and another 2u post-embo on 10/2, stable since.    - Trend hemoglobin. Remains stable above 10.6   ESRD (end stage renal disease) [facility]) -Nephrology following, hemodialysis stopped due to patient agitation. - Nephrology states patient is not a candidate for further hemodialysis. - Now medical care is transitioned to full comfort care.    Acute right internal jugular DVT She has had life-threatening bleeding. Risk of anticoagulation outweighs benefit at this time. - Monitor arm clinically.   PVD (peripheral vascular disease): - Hold aspirin  and Lipitor in setting of poor oral intake   Pleural effusion: IR previously consulted, not enough fluid to tap.   C. difficile colitis: Resolved   Protein-calorie malnutrition, severe: Failure to thrive: Failed trial of tube feeds earlier in this hospital stay due to worsening of diarrhea.   Not a candidate for TPN. - Consult Palliative care. - Consult Dietitian.   Essential hypertension: BP measurements may or may not be accurate, patient lies on side and due to her terminal state, is hard to reposition for accurate BP measurements.    - Continue clonidine  patch, losartan , spironolactone . - IV hydralazine  PRN and labetalol  scheduled   Type 2 diabetes mellitus with other specified complication (HCC) Glucose low  due to FTT. - Stop SSI - Accuchecks once daily   DVT prophylaxis: SCDS Code Status: Full code Family Communication: No family at bed side. Disposition Plan:   Status is: Inpatient Remains inpatient appropriate because:   Now medical care is transitioned to full comfort care.  Consultants:  Nephrology Vascular surgery Palliative care  Procedures:  Antimicrobials:  Anti-infectives (From admission, onward)    Start     Dose/Rate Route Frequency Ordered Stop   06/17/24 1800  vancomycin  (VANCOREADY) IVPB 500 mg/100 mL  Status:  Discontinued        500 mg 100 mL/hr over 60 Minutes Intravenous Every M-W-F (Hemodialysis) 06/15/24 0738 06/17/24 1549   06/16/24 1800  ceFEPIme  (MAXIPIME ) 1 g in sodium chloride  0.9 % 100 mL IVPB  Status:  Discontinued        1 g 200 mL/hr over 30 Minutes Intravenous Every evening 06/15/24 0736 06/17/24 1549   06/15/24 0830  vancomycin  (VANCOREADY) IVPB 750 mg/150 mL        750 mg 150 mL/hr over 60 Minutes Intravenous  Once 06/15/24 0735 06/15/24 1513   06/15/24 0745  ceFEPIme  (MAXIPIME ) 1 g in sodium chloride  0.9 % 100 mL IVPB        1 g 200 mL/hr over 30 Minutes Intravenous Once 06/15/24 0735 06/15/24 0902   06/04/24 1215  vancomycin  (VANCOCIN ) capsule 125 mg        125 mg Oral Daily 06/04/24 1116  06/05/24 0621   05/27/24 1000  vancomycin  (VANCOCIN ) capsule 125 mg        125 mg Oral Daily 05/21/24 1019 06/04/24 0959   05/20/24 1530  amoxicillin -clavulanate (AUGMENTIN ) 500-125 MG per tablet 1 tablet        1 tablet Oral Every 12 hours 05/20/24 1519 05/29/24 2152   05/17/24 0100  fidaxomicin  (DIFICID ) tablet 200 mg        200 mg Oral 2 times daily 05/17/24 0003 05/26/24 1037   05/17/24 0100  metroNIDAZOLE  (FLAGYL ) IVPB 500 mg  Status:  Discontinued        500 mg 100 mL/hr over 60 Minutes Intravenous Every 12 hours 05/17/24 0003 05/20/24 1519        Subjective: Patient was seen and examined at bedside.  She appears comfortable.  She is alert,  following commands. Patient is not a candidate for further hemodialysis. Now medical care is transitioned to full comfort care.  Objective: Vitals:   06/19/24 1011 06/19/24 1414 06/20/24 0510 06/20/24 0944  BP: (!) 187/71 (!) 151/67 (!) 225/71 (!) 161/131  Pulse: 86 94 84 86  Resp: 12 16 16 18   Temp: (!) 97 F (36.1 C)   98.6 F (37 C)  TempSrc:    Oral  SpO2: 100% 100% 100% 100%  Weight:      Height:        Intake/Output Summary (Last 24 hours) at 06/20/2024 1128 Last data filed at 06/19/2024 2051 Gross per 24 hour  Intake 0 ml  Output --  Net 0 ml   Filed Weights   06/14/24 1224 06/17/24 0810 06/19/24 0806  Weight: 52.2 kg 51.1 kg 48.3 kg    Examination:  General exam: Appears calm and comfortable, deconditioned, not in any acute distress. Respiratory system: Decreased breath sounds, respiratory effort normal.  RR 14 Cardiovascular system: S1 & S2 heard, RRR. No JVD, murmurs, rubs, gallops or clicks. No pedal edema. Gastrointestinal system: Abdomen is non distended, soft and nontender.  Normal bowel sounds heard. Central nervous system: Alert and oriented x1. No focal neurological deficits. Extremities: No edema , no cyanosis, no clubbing. Skin: No rashes, lesions or ulcers Psychiatry: Judgement and insight appear normal. Mood & affect appropriate.     Data Reviewed: I have personally reviewed following labs and imaging studies  CBC: Recent Labs  Lab 06/15/24 0500 06/16/24 0500 06/17/24 0730 06/18/24 0203 06/19/24 0643  WBC 24.2* 22.5* 23.1* 22.4* 17.7*  NEUTROABS  --   --   --   --  15.4*  HGB 10.6* 10.0* 10.5* 10.2* 10.6*  HCT 32.1* 30.6* 32.3* 32.3* 33.2*  MCV 90.2 91.6 92.0 95.6 94.1  PLT 203 199 193 196 221   Basic Metabolic Panel: Recent Labs  Lab 06/15/24 0500 06/16/24 0500 06/17/24 0730 06/18/24 0203 06/20/24 0310  NA 136 132* 131* 134* 136  K 4.0 3.7 4.0 3.9 3.8  CL 97* 93* 97* 96* 97*  CO2 25 24 21* 26 27  GLUCOSE 72 145* 69* 76 79   BUN 28* 36* 44* 24* 30*  CREATININE 3.53* 4.29* 5.19* 3.52* 3.86*  CALCIUM  8.5* 8.1* 8.0* 7.9* 7.9*  PHOS  --   --  4.8*  --   --    GFR: Estimated Creatinine Clearance: 10.9 mL/min (A) (by C-G formula based on SCr of 3.86 mg/dL (H)). Liver Function Tests: Recent Labs  Lab 06/14/24 0922 06/16/24 0500 06/17/24 0730 06/18/24 0203  AST 12* 15  --  14*  ALT 9 10  --  10  ALKPHOS 43 55  --  46  BILITOT 1.3* 0.7  --  1.0  PROT 5.7* 5.4*  --  5.4*  ALBUMIN 2.2* 1.9* 1.8* 1.9*   No results for input(s): LIPASE, AMYLASE in the last 168 hours. No results for input(s): AMMONIA in the last 168 hours. Coagulation Profile: No results for input(s): INR, PROTIME in the last 168 hours. Cardiac Enzymes: No results for input(s): CKTOTAL, CKMB, CKMBINDEX, TROPONINI in the last 168 hours. BNP (last 3 results) No results for input(s): PROBNP in the last 8760 hours. HbA1C: No results for input(s): HGBA1C in the last 72 hours. CBG: Recent Labs  Lab 06/17/24 1434 06/17/24 1801 06/18/24 0400 06/19/24 0601 06/19/24 0656  GLUCAP 96 89 67* 71 72   Lipid Profile: No results for input(s): CHOL, HDL, LDLCALC, TRIG, CHOLHDL, LDLDIRECT in the last 72 hours. Thyroid Function Tests: No results for input(s): TSH, T4TOTAL, FREET4, T3FREE, THYROIDAB in the last 72 hours. Anemia Panel: Recent Labs    06/18/24 0203  FERRITIN 4,277*  TIBC NOT CALCULATED  IRON 35   Sepsis Labs: No results for input(s): PROCALCITON, LATICACIDVEN in the last 168 hours.  Recent Results (from the past 240 hours)  Culture, blood (Routine X 2) w Reflex to ID Panel     Status: None   Collection Time: 06/15/24  1:10 PM   Specimen: BLOOD RIGHT HAND  Result Value Ref Range Status   Specimen Description BLOOD RIGHT HAND  Final   Special Requests   Final    BOTTLES DRAWN AEROBIC AND ANAEROBIC Blood Culture adequate volume   Culture   Final    NO GROWTH 5 DAYS Performed at  [facility], [address] [place], [place] [address]    Report Status 06/20/2024 FINAL  Final  Culture, blood (Routine X 2) w Reflex to ID Panel     Status: None   Collection Time: 06/15/24  2:14 PM   Specimen: BLOOD RIGHT ARM  Result Value Ref Range Status   Specimen Description BLOOD RIGHT ARM  Final   Special Requests   Final    BOTTLES DRAWN AEROBIC ONLY Blood Culture results may not be optimal due to an inadequate volume of blood received in culture bottles   Culture   Final    NO GROWTH 5 DAYS Performed at [facility], [address] [place], [place] [address]    Report Status 06/20/2024 FINAL  Final    Radiology Studies: No results found.  Scheduled Meds:  (feeding supplement) PROSource Plus  30 mL Oral BID BM   Chlorhexidine  Gluconate Cloth  6 each Topical Q0600   [START ON 06/23/2024] cloNIDine   0.3 mg Transdermal Q Sun   Gerhardt's butt cream   Topical QID   labetalol   10 mg Intravenous Once   sodium chloride  flush  10-40 mL Intracatheter Q12H   Continuous Infusions:   LOS: 35 days    Time spent: 35 mins.    Darcel Dawley, MD [facility]   If 7PM-7AM, please contact night-coverage

## 2024-06-20 NOTE — Plan of Care (Signed)
  Problem: Pain Managment: Goal: General experience of comfort will improve and/or be controlled Outcome: Progressing

## 2024-06-20 NOTE — Progress Notes (Signed)
 Comfort care noted. Contacted pt out-pt HD clinic, [facility]. This update was provided to them at this time. Will continue to assist.   Lavanda Fredrickson Dialysis Navigator [phone]

## 2024-06-20 NOTE — Progress Notes (Signed)
 Daily Progress Note   Date: 06/20/2024   Patient Name: Tracy Jennings  DOB: [birth year]  MRN: [id number]  Age / Sex: 68 y.o., female  Attending Physician: Leotis Bogus, MD Primary Care Physician: Zachary Idelia LABOR, MD Admit Date: 05/16/2024 Length of Stay: 35 days  Reason for Follow-up: Establishing goals of care, Non pain symptom management, Pain control, Psychosocial/spiritual support, and Terminal Care  Past Medical History:  Diagnosis Date   Anemia 04/2018   low iron. to be started on supplements   Cervical cancer (HCC)    CKD (chronic kidney disease)    Stage IV   Complication of anesthesia    receceived too much anesthesia, that she was in coma for a couple days    COVID-19 virus detected 10/28/2019   Diabetes mellitus without complication (HCC)    type II   ESRD (end stage renal disease) (HCC)    Heart murmur    followed as a child only   HSIL (high grade squamous intraepithelial lesion) on Pap smear of cervix    Hyperlipidemia associated with type 2 diabetes mellitus (HCC)    Hypertension    Pancreatitis    Peripheral vascular disease     Subjective:   Subjective: Chart Reviewed. Updates received. Patient Assessed. Created space and opportunity for patient  and family to explore thoughts and feelings regarding current medical situation.  Today's Discussion: Today before meeting with the patient/family, I reviewed the chart notes including nursing note from today, social work from today. I also reviewed vital signs, nursing flowsheets, medication administrations record, labs, and imaging. Did not review labs due to comfort care status.  Vital signs today include temperature of 97, heart rate 84, respiratory rate 16, blood pressure 225/71, satting 100% on room air.  Comfort medications administered include 1 dose of Dilaudid  at 5:43 PM yesterday, none today.  Patient has not required any doses of Ativan , Haldol , Robinul , oxycodone  in the past 24 hours.  Prior to  seeing the patient I spoke with the bedside nurse.  She notes that the patient pulled out her port access, and is tearful but denying pain.  We discussed comfort medications available including pain medicine, Ativan  for anxiety.  She notes her blood pressure was 160 systolic and the hospitalist ordered labetalol  despite comfort care.  She is waiting for IV team consult to reaccess her port.  Today saw the patient at bedside, no family was present.  She is sleeping but easily awakened to voice.  Efforts she denies pain, but then she becomes tearful and asked her what was the matter and she states pain.  I asked her where she is hurting and she notes her hips.  I asked her if she wants pain medicine she says yes.  She also seems to have a lot of anxiety and fear.  Discussed with the bedside nurse and given no port access will provide for oral Ativan  for now until access is reestablished at which point she will medicate for pain.  I shared that I would come check on her tomorrow.  I told the nurse that I am available for any questions or needs. I provided emotional and general support through therapeutic listening, empathy, sharing of stories, and other techniques. I answered all questions and addressed all concerns to the best of my ability.  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 difficile colitis  PVD (peripheral vascular disease)  Protein-calorie malnutrition, severe   Vital Signs:  BP (!) 225/71 (BP Location: Right Arm)   Pulse 84   Temp (!) 97 F (36.1 C)   Resp 16   Ht 5' 3 (1.6 m)   Wt 48.3 kg   LMP  (LMP Unknown)    SpO2 100%   BMI 18.86 kg/m   Physical Exam Vitals and nursing note reviewed.  Constitutional:      General: She is sleeping. She is not in acute distress.    Appearance: She is ill-appearing.  HENT:     Head: Normocephalic and atraumatic.  Cardiovascular:     Rate and Rhythm: Normal rate.  Pulmonary:     Effort: Pulmonary effort is normal. No respiratory distress.  Abdominal:     General: Abdomen is flat.     Palpations: Abdomen is soft.  Neurological:     General: No focal deficit present.     Mental Status: She is easily aroused. She is confused.  Psychiatric:        Mood and Affect: Affect is tearful.     Palliative Assessment/Data: 20-30%   Existing Vynca/ACP Documentation: None  Assessment & Plan:   HPI/Patient Profile:  68 y.o. female with past medical history of cognitive impairment, ESRD on HD MWF, cervical cancer, HTN, HLD, anemia and recent hospitalizations from 6/24-7/22 for hypertensive urgency and delirium, and 8/17-8/24 for C. difficile colitis for which she was treated with p.o. vancomycin  admitted on 05/16/2024 with weakness, nausea, diarrhea and abdominal discomfort. CT abdomen and pelvis showed circumferential wall thickening of rectosigmoid colon suspicious for colitis, Colo uterine fistula, increased bilateral pleural effusions, left > right and area of rounded density in anterior LLL measuring 3.1 cm, and progressive anasarca. General surgery at [facility] was consulted and recommended higher level of care and the ER physician discussed with Dr. Dasie general surgeon at [facility] who agreed to see patient in consult. Patient was started on Dificid  and Flagyl  for C. difficile colitis. Evaluated by general surgery - urgent surgical intervention is not indicated. PMT consulted to discuss GOC.  SUMMARY OF RECOMMENDATIONS   DNR-comfort Continue comfort care See symptom management orders below Palliative medicine will continue to follow  Symptom Management:   Tylenol  650 mg PR every 6 hours as needed mild pain (1-3), fever Artificial tears 1 drop OU 4 times daily as needed dry eyes Robinul  0.2 mg IV every 4 hours as needed excessive secretions Haldol  0.5 mg IV every 4 hours as needed agitation or delirium Dilaudid  0.5 to 2 mg IV every 30 minutes as needed severe pain (7-10), signs/symptoms of distress Ativan  1 mg IV every 4 hours as needed anxiety Zofran  4 mg IV every 6 hours as needed nausea Oxycodone  2.5 mg p.o. every 6 hours as needed pain  Code Status: DNR - Comfort  Prognosis: < 2 weeks  Discharge Planning: Anticipated Hospital Death  Discussed with: Patient, medical team, nursing team  Thank you for allowing us  to participate in the care of Yaritzel D Broers PMT will continue to support holistically.  Billing based on MDM: High  Problems Addressed: One acute or chronic illness or injury that poses a threat to life or bodily function  Risks: Parenteral  controlled substances  Detailed review of medical records (labs, imaging, vital signs), medically appropriate exam, discussed with treatment team, counseling and education to patient, family, & staff, documenting clinical information, medication management, coordination of care  [name], NP Palliative Medicine Team  Team Phone # [phone] (Nights/Weekends)  05/11/2021, 8:17 AM

## 2024-06-21 DIAGNOSIS — N824 Other female intestinal-genital tract fistulae: Secondary | ICD-10-CM | POA: Diagnosis not present

## 2024-06-21 NOTE — Plan of Care (Signed)
   Problem: Health Behavior/Discharge Planning: Goal: Ability to manage health-related needs will improve Outcome: Progressing   Problem: Clinical Measurements: Goal: Ability to maintain clinical measurements within normal limits will improve Outcome: Progressing Goal: Will remain free from infection Outcome: Progressing

## 2024-06-21 NOTE — Progress Notes (Signed)
 Daily Progress Note   Date: 06/21/2024   Patient Name: Tracy Jennings  DOB: [birth year]  MRN: [id number]  Age / Sex: 68 y.o., female  Attending Physician: Leotis Bogus, MD Primary Care Physician: Zachary Idelia LABOR, MD Admit Date: 05/16/2024 Length of Stay: 36 days  Reason for Follow-up: Establishing goals of care, Non pain symptom management, Pain control, Psychosocial/spiritual support, and Terminal Care  Past Medical History:  Diagnosis Date   Anemia 04/2018   low iron. to be started on supplements   Cervical cancer (HCC)    CKD (chronic kidney disease)    Stage IV   Complication of anesthesia    receceived too much anesthesia, that she was in coma for a couple days    COVID-19 virus detected 10/28/2019   Diabetes mellitus without complication (HCC)    type II   ESRD (end stage renal disease) (HCC)    Heart murmur    followed as a child only   HSIL (high grade squamous intraepithelial lesion) on Pap smear of cervix    Hyperlipidemia associated with type 2 diabetes mellitus (HCC)    Hypertension    Pancreatitis    Peripheral vascular disease     Subjective:   Subjective: Chart Reviewed. Updates received. Patient Assessed. Created space and opportunity for patient  and family to explore thoughts and feelings regarding current medical situation.  Today's Discussion: Today before meeting with the patient/family, I reviewed vital signs, nursing flowsheets, medication administrations record, labs, and imaging. No labs drawn due to comfort care status.  Vital signs earlier this morning include temperature of 98.6, heart rate 83, respiratory rate 16, blood pressure 189/67, satting 96% on room air.  Comfort medications administered include 2 doses of Dilaudid  yesterday at 11:24 AM and 3:34 PM, as well as a dose of Dilaudid  this morning at 00: 21 AM.  She also received 1 dose of Ativan  at 10:10 AM yesterday, none today.  No doses of Robinul  or Haldol  required in the past 24  hours.  Will continue parenteral opioids due to need thus far and anticipated need moving forward.  Today saw the patient at bedside, no family was present.  She is sleeping soundly and because of the early morning hours I elected to not try to wake her.  Her respirations are even and unlabored, no moaning or groaning or other audible signs of discomfort or distress.  No audible secretions.  The patient has her head covered into the blanket so I cannot assess her face for any grimacing or other objective signs of discomfort or distress.  Will continue to monitor for any comfort care and palliative needs.  Nursing staff is aware to contact palliative medicine for any questions, concerns, patient needs related to comfort care and end-of-life.  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 difficile colitis  PVD (peripheral vascular disease)  Protein-calorie malnutrition, severe   Vital Signs:  BP (!) 189/67 (BP Location: Right Arm)   Pulse 83   Temp 98.6 F (37 C) (Oral)   Resp 16   Ht 5' 3 (1.6 m)   Wt 48.3 kg   LMP  (LMP Unknown)   SpO2 96%   BMI 18.86  kg/m   Physical Exam Vitals and nursing note reviewed.  Constitutional:      General: She is sleeping. She is not in acute distress.    Appearance: She is ill-appearing.  HENT:     Head: Normocephalic and atraumatic.  Cardiovascular:     Rate and Rhythm: Normal rate.  Pulmonary:     Effort: Pulmonary effort is normal. No respiratory distress.  Abdominal:     General: Abdomen is flat.  Neurological:     Mental Status: She is confused.  Psychiatric:        Mood and Affect: Affect is tearful.      Palliative Assessment/Data: 20-30%   Existing Vynca/ACP Documentation: None  Assessment & Plan:   HPI/Patient Profile:  68 y.o. female with past medical history of cognitive impairment, ESRD on HD MWF, cervical cancer, HTN, HLD, anemia and recent hospitalizations from 6/24-7/22 for hypertensive urgency and delirium, and 8/17-8/24 for C. difficile colitis for which she was treated with p.o. vancomycin  admitted on 05/16/2024 with weakness, nausea, diarrhea and abdominal discomfort. CT abdomen and pelvis showed circumferential wall thickening of rectosigmoid colon suspicious for colitis, Colo uterine fistula, increased bilateral pleural effusions, left > right and area of rounded density in anterior LLL measuring 3.1 cm, and progressive anasarca. General surgery at [facility] was consulted and recommended higher level of care and the ER physician discussed with Dr. Dasie general surgeon at [facility] who agreed to see patient in consult. Patient was started on Dificid  and Flagyl  for C. difficile colitis. Evaluated by general surgery - urgent surgical intervention is not indicated. PMT consulted to discuss GOC.  SUMMARY OF RECOMMENDATIONS   DNR-comfort Continue comfort care See symptom management orders below Palliative medicine will continue to follow  Symptom Management:  Tylenol  650 mg PR every 6 hours as needed mild pain (1-3), fever Artificial tears 1 drop OU 4 times daily as needed dry eyes Robinul  0.2 mg IV every 4 hours as needed excessive secretions Haldol  0.5 mg IV every 4 hours as needed agitation or delirium Dilaudid  0.5 to 2 mg IV every 30 minutes as needed severe pain (7-10), signs/symptoms of distress Ativan  1 mg IV every 4 hours as needed anxiety Zofran  4 mg IV every 6 hours as needed nausea Oxycodone  2.5 mg p.o. every 6 hours as needed pain  Code Status: DNR - Comfort  Prognosis: < 2 weeks  Discharge Planning: Anticipated Hospital Death  Discussed with: Patient, medical  team, nursing team  Thank you for allowing us  to participate in the care of Chaselyn D Bidwell PMT will continue to support holistically.  Billing based on MDM: High  Problems Addressed: One acute or chronic illness or injury that poses a threat to life or bodily function  Risks: Parenteral controlled substances  Detailed review of medical records (labs, imaging, vital signs), medically appropriate exam, discussed with treatment team, counseling and education to patient, family, & staff, documenting clinical information, medication management, coordination of care  [name], NP Palliative Medicine Team  Team Phone # [phone] (Nights/Weekends)  05/11/2021, 8:17 AM

## 2024-06-21 NOTE — Progress Notes (Signed)
 PROGRESS NOTE    Tracy Jennings  FMW:[id number] DOB: [birth year] DOA: 05/16/2024 PCP: Zachary Idelia LABOR, MD    Brief Narrative:  This 68 yrs old Female with ESRD on HD MWF, Cervical cancer lost to follow up, HTN, and cognitive impairment who presented with stool from vagina, found to have colo-uterine fistula. Initial plan after general surgery evaluation was discharge with outpatient follow-up for surgical resection of fistula, but it became clear that the patient's functional status had declined so much that she was too weak to sit up in a chair for dialysis, and her hospitalization was prolonged. She subsequently then developed bleeding from her colo-uterine fistula, and GI were consulted. On flexible sigmoidoscopy, she was found to have a large malignant appearing mass at the site of the fistula. Post-sigmoidoscopy She developed heavy bleeding and was taken for embolization. Post-embolization, biopsy inconclusive, but patient's functional status continues to decline. Now medical care transitioned to full comfort care.   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 -Due to weakness, failure to thrive, patient unable to progress to discharge (couldn't sit up in  chair for HD) and eventually developed DVT, She was started on Eliquis , and started to have rectal/vaginal bleeding.   -GI consulted, flexible sigmoidoscopy performed on 10/2, and found to have malignant appearing mass at fistula.   -Biopsy led to brisk bleeding, requiring emergency IR embolization of anterior division of left internal iliac artery.   -Bleeding abated and has been stable since then. -Biopsy with debris only, no cellular material.  Immunohistochemical stains still pending.   # There is broad consensus from general surgery, GYN oncology that the patient is not an operative candidate and her fistula is incurable. There is also high clinical suspicion that this is cancer, but that biopsy nor chemo are feasible given her overall malnutrition. and functional status. -Continue to engage family regarding goals of care. - Now medical care is transitioned to full comfort care.     History of cervical clear cell adenoCA: Masslike consolidation of the left lung: Pelvic lymphadenopathy Failure to thrive: As noted previously, the patient underwent chemotherapy and radiation as well as brachytherapy in 2021 for her unusual cervical cancer.   In 2023, GYN oncology notes imply that she had some thickening of the colon and pelvic lymphadenopathy and a PET scan and follow-up imaging was planned, but does not appear to ever been completed and unfortunately no follow-up since then.   Biopsy inconclusive, but strongly clinically suspect cancer. Continues to struffle to stay awake, to eat, and has a high symptom burden with stool from vagina, abdominal pain.   Yesterday's dialysis was halted early.Today HD stopped and Nephrology states, She is not a candidate for HD.   Shared frankly with family Sunday and Monday that there are no curative options  left, and each week in the hospital has seen her weaker and weaker.  Family wish to observe another dialysis session.  Paitent no longer able to  participate in goals of care discussions, decision makers are her son [name] and sister [name].   Acute metabolic encephalopathy: Dementia At baseline, patient interactive and lives at home, familiar with surroundings but with memory loss.  Here, she has been alert but oriented only to self for most of hospital stay.  Since flex sig on 10/2, she is less responsive, somnolent, due to blood loss, malnutrition primarily.  No focal signs but exam limited due to somnolence.   Acute blood loss anemia due to fistula on chronic anemia of CKD: Baseline 9-10.  Required 2 units transfusion early in hospital stay.   Then transfused 2u PRBC 10/1 and another 2u post-embo on 10/2, stable since.    - Trend hemoglobin. Remains stable above 10.6   ESRD (end stage renal disease) [facility]) -Nephrology following, hemodialysis stopped due to patient agitation. - Nephrology states patient is not a candidate for further hemodialysis. - Now medical care is transitioned to full comfort care.    Acute right internal jugular DVT She has had life-threatening bleeding. Risk of anticoagulation outweighs benefit at this time. - Monitor arm clinically.   PVD (peripheral vascular disease): - Hold aspirin  and Lipitor in setting of poor oral intake   Pleural effusion: IR previously consulted, not enough fluid to tap.   C. difficile colitis: Resolved   Protein-calorie malnutrition, severe: Failure to thrive: Failed trial of tube feeds earlier in this hospital stay due to worsening of diarrhea.   Not a candidate for TPN. - Consult Palliative care. - Consult Dietitian.   Essential hypertension: BP measurements may or may not be accurate, patient lies on side and due to her terminal state, is hard to reposition for accurate BP measurements.    - Continue clonidine  patch, losartan , spironolactone . - IV hydralazine  PRN and labetalol  scheduled   Type 2 diabetes mellitus with other specified complication (HCC) Glucose low  due to FTT. - Stop SSI - Accuchecks once daily   DVT prophylaxis: SCDS Code Status: Full code Family Communication: No family at bed side. Disposition Plan:   Status is: Inpatient Remains inpatient appropriate because:   Now medical care is transitioned to full comfort care.  Consultants:  Nephrology Vascular surgery Palliative care  Procedures:  Antimicrobials:  Anti-infectives (From admission, onward)    Start     Dose/Rate Route Frequency Ordered Stop   06/17/24 1800  vancomycin  (VANCOREADY) IVPB 500 mg/100 mL  Status:  Discontinued        500 mg 100 mL/hr over 60 Minutes Intravenous Every M-W-F (Hemodialysis) 06/15/24 0738 06/17/24 1549   06/16/24 1800  ceFEPIme  (MAXIPIME ) 1 g in sodium chloride  0.9 % 100 mL IVPB  Status:  Discontinued        1 g 200 mL/hr over 30 Minutes Intravenous Every evening 06/15/24 0736 06/17/24 1549   06/15/24 0830  vancomycin  (VANCOREADY) IVPB 750 mg/150 mL        750 mg 150 mL/hr over 60 Minutes Intravenous  Once 06/15/24 0735 06/15/24 1513   06/15/24 0745  ceFEPIme  (MAXIPIME ) 1 g in sodium chloride  0.9 % 100 mL IVPB        1 g 200 mL/hr over 30 Minutes Intravenous Once 06/15/24 0735 06/15/24 0902   06/04/24 1215  vancomycin  (VANCOCIN ) capsule 125 mg        125 mg Oral Daily 06/04/24 1116  06/05/24 0621   05/27/24 1000  vancomycin  (VANCOCIN ) capsule 125 mg        125 mg Oral Daily 05/21/24 1019 06/04/24 0959   05/20/24 1530  amoxicillin -clavulanate (AUGMENTIN ) 500-125 MG per tablet 1 tablet        1 tablet Oral Every 12 hours 05/20/24 1519 05/29/24 2152   05/17/24 0100  fidaxomicin  (DIFICID ) tablet 200 mg        200 mg Oral 2 times daily 05/17/24 0003 05/26/24 1037   05/17/24 0100  metroNIDAZOLE  (FLAGYL ) IVPB 500 mg  Status:  Discontinued        500 mg 100 mL/hr over 60 Minutes Intravenous Every 12 hours 05/17/24 0003 05/20/24 1519        Subjective: Patient was seen and examined at bedside. She appears comfortable.   She is alert,  following commands. Patient is not a candidate for further hemodialysis.  Now medical care is transitioned to full comfort care.  Objective: Vitals:   06/20/24 0510 06/20/24 0944 06/20/24 1132 06/21/24 0102  BP: (!) 225/71 (!) 161/131 (!) 171/57 (!) 189/67  Pulse: 84 86 80 83  Resp: 16 18 17 16   Temp:  98.6 F (37 C)    TempSrc:  Oral    SpO2: 100% 100%  96%  Weight:      Height:        Intake/Output Summary (Last 24 hours) at 06/21/2024 1152 Last data filed at 06/21/2024 0100 Gross per 24 hour  Intake 200 ml  Output --  Net 200 ml   Filed Weights   06/14/24 1224 06/17/24 0810 06/19/24 0806  Weight: 52.2 kg 51.1 kg 48.3 kg    Examination:  General exam: Appears calm and comfortable, deconditioned, not in any acute distress. Respiratory system: Decreased breath sounds, respiratory effort normal.  RR 15 Cardiovascular system: S1 & S2 heard, RRR. No JVD, murmurs, rubs, gallops or clicks. No pedal edema. Gastrointestinal system: Abdomen is non distended, soft and nontender.  Normal bowel sounds heard. Central nervous system: Alert and oriented x1. No focal neurological deficits. Extremities: No edema , no cyanosis, no clubbing. Skin: No rashes, lesions or ulcers Psychiatry: Judgement and insight appear normal. Mood & affect appropriate.     Data Reviewed: I have personally reviewed following labs and imaging studies  CBC: Recent Labs  Lab 06/15/24 0500 06/16/24 0500 06/17/24 0730 06/18/24 0203 06/19/24 0643  WBC 24.2* 22.5* 23.1* 22.4* 17.7*  NEUTROABS  --   --   --   --  15.4*  HGB 10.6* 10.0* 10.5* 10.2* 10.6*  HCT 32.1* 30.6* 32.3* 32.3* 33.2*  MCV 90.2 91.6 92.0 95.6 94.1  PLT 203 199 193 196 221   Basic Metabolic Panel: Recent Labs  Lab 06/15/24 0500 06/16/24 0500 06/17/24 0730 06/18/24 0203 06/20/24 0310  NA 136 132* 131* 134* 136  K 4.0 3.7 4.0 3.9 3.8  CL 97* 93* 97* 96* 97*  CO2 25 24 21* 26 27  GLUCOSE 72 145* 69* 76 79  BUN 28* 36* 44*  24* 30*  CREATININE 3.53* 4.29* 5.19* 3.52* 3.86*  CALCIUM  8.5* 8.1* 8.0* 7.9* 7.9*  PHOS  --   --  4.8*  --   --    GFR: Estimated Creatinine Clearance: 10.9 mL/min (A) (by C-G formula based on SCr of 3.86 mg/dL (H)). Liver Function Tests: Recent Labs  Lab 06/16/24 0500 06/17/24 0730 06/18/24 0203  AST 15  --  14*  ALT 10  --  10  ALKPHOS 55  --  46  BILITOT 0.7  --  1.0  PROT 5.4*  --  5.4*  ALBUMIN 1.9* 1.8* 1.9*   No results for input(s): LIPASE, AMYLASE in the last 168 hours. No results for input(s): AMMONIA in the last 168 hours. Coagulation Profile: No results for input(s): INR, PROTIME in the last 168 hours. Cardiac Enzymes: No results for input(s): CKTOTAL, CKMB, CKMBINDEX, TROPONINI in the last 168 hours. BNP (last 3 results) No results for input(s): PROBNP in the last 8760 hours. HbA1C: No results for input(s): HGBA1C in the last 72 hours. CBG: Recent Labs  Lab 06/17/24 1434 06/17/24 1801 06/18/24 0400 06/19/24 0601 06/19/24 0656  GLUCAP 96 89 67* 71 72   Lipid Profile: No results for input(s): CHOL, HDL, LDLCALC, TRIG, CHOLHDL, LDLDIRECT in the last 72 hours. Thyroid Function Tests: No results for input(s): TSH, T4TOTAL, FREET4, T3FREE, THYROIDAB in the last 72 hours. Anemia Panel: No results for input(s): VITAMINB12, FOLATE, FERRITIN, TIBC, IRON, RETICCTPCT in the last 72 hours.  Sepsis Labs: No results for input(s): PROCALCITON, LATICACIDVEN in the last 168 hours.  Recent Results (from the past 240 hours)  Culture, blood (Routine X 2) w Reflex to ID Panel     Status: None   Collection Time: 06/15/24  1:10 PM   Specimen: BLOOD RIGHT HAND  Result Value Ref Range Status   Specimen Description BLOOD RIGHT HAND  Final   Special Requests   Final    BOTTLES DRAWN AEROBIC AND ANAEROBIC Blood Culture adequate volume   Culture   Final    NO GROWTH 5 DAYS Performed at [facility],  [address] [place], [place] [address]    Report Status 06/20/2024 FINAL  Final  Culture, blood (Routine X 2) w Reflex to ID Panel     Status: None   Collection Time: 06/15/24  2:14 PM   Specimen: BLOOD RIGHT ARM  Result Value Ref Range Status   Specimen Description BLOOD RIGHT ARM  Final   Special Requests   Final    BOTTLES DRAWN AEROBIC ONLY Blood Culture results may not be optimal due to an inadequate volume of blood received in culture bottles   Culture   Final    NO GROWTH 5 DAYS Performed at [facility], [address] [place], [place] [address]    Report Status 06/20/2024 FINAL  Final    Radiology Studies: No results found.  Scheduled Meds:  (feeding supplement) PROSource Plus  30 mL Oral BID BM   Chlorhexidine  Gluconate Cloth  6 each Topical Q0600   [START ON 06/23/2024] cloNIDine   0.3 mg Transdermal Q Sun   Gerhardt's butt cream   Topical QID   labetalol   10 mg Intravenous Once   sodium chloride  flush  10-40 mL Intracatheter Q12H   Continuous Infusions:   LOS: 36 days    Time spent: 35 mins.    Darcel Dawley, MD [facility]   If 7PM-7AM, please contact night-coverage

## 2024-06-22 DIAGNOSIS — N824 Other female intestinal-genital tract fistulae: Secondary | ICD-10-CM | POA: Diagnosis not present

## 2024-06-22 NOTE — Progress Notes (Signed)
 Daily Progress Note   Date: 06/22/2024   Patient Name: Tracy Jennings  DOB: 22-Mar-1956  MRN: [id number]  Age / Sex: 68 y.o., female  Attending Physician: Leotis Bogus, MD Primary Care Physician: Zachary Idelia LABOR, MD Admit Date: 05/16/2024 Length of Stay: 37 days  Reason for Follow-up: Establishing goals of care, Non pain symptom management, Pain control, Psychosocial/spiritual support, and Terminal Care  Past Medical History:  Diagnosis Date   Anemia 04/2018   low iron. to be started on supplements   Cervical cancer (HCC)    CKD (chronic kidney disease)    Stage IV   Complication of anesthesia    receceived too much anesthesia, that she was in coma for a couple days    COVID-19 virus detected 10/28/2019   Diabetes mellitus without complication (HCC)    type II   ESRD (end stage renal disease) (HCC)    Heart murmur    followed as a child only   HSIL (high grade squamous intraepithelial lesion) on Pap smear of cervix    Hyperlipidemia associated with type 2 diabetes mellitus (HCC)    Hypertension    Pancreatitis    Peripheral vascular disease     Subjective:   Subjective: Chart Reviewed. Updates received. Patient Assessed. Created space and opportunity for patient  and family to explore thoughts and feelings regarding current medical situation.  Today's Discussion: Today before meeting with the patient/family, I reviewed vital signs, nursing flowsheets, medication administrations record, labs, and imaging. No labs drawn due to comfort care status.  Vital signs earlier this morning include temperature of 97.6, heart rate 92, respiratory rate 16, blood pressure 223/78, satting 100% on room air.  Comfort medications administered include 1 dose of oxycodone  today, a dose of Zofran  today.  In the past 48 hours she has required Dilaudid  and Ativan .  Will continue parenteral opioids due to need thus far and anticipated need moving forward.  Today I saw the patient at bedside,  no family was present.  She was awake, making Eye contact.  She complains that she is very cold and I retrieved two warm blankets which she stated helped.  She is complaining of abdominal pain today as well as nausea.  After seeing the patient I requested pain and nausea medicine from the nurse.  She seems a bit withdrawn today, not really wanting to converse much.  She hit her head under the blankets and so I elected to allow her to rest.  I had received a message from her son requesting a callback to discuss her clinical situation.  He notes that she is eating better and wants to talk more about her plan.  I made 2 attempts to call the patient's son back at 10:45 in the morning and at 1:15 in the afternoon.  Both times I received a voicemail left a message for callback.  I will handoff to a colleague to follow-up with family as we are able to reach them.  Currently I feel that, although they think her appetite is improved, that is not her major problem.  Regardless of her appetite we cannot biopsy nor treat her cancer.  Her end-of-life is approaching.  Options forward include comfort care and no further hemodialysis which will result in a more peaceful passing.  If family elects to revert to full scope this will likely result in her cancer taking over and passing in more of a suffering manner.  The multidisciplinary medical team and palliative teams are all in agreement  that comfort care is the best path forward given the inability to treat or cure her disease processes.  Will continue to monitor for any comfort care and palliative needs.  Nursing staff is aware to contact palliative medicine for any questions, concerns, patient needs related to comfort care and end-of-life.  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 difficile colitis  PVD (peripheral vascular disease)  Protein-calorie malnutrition, severe   Vital Signs:  BP (!) 223/78 (BP Location: Right Arm)   Pulse 92   Temp 97.6 F (36.4 C) (Oral)   Resp 16   Ht 5' 3 (1.6 m)   Wt 48.3 kg   LMP  (LMP Unknown)   SpO2 100%   BMI 18.86 kg/m   Physical Exam Vitals and nursing note reviewed.  Constitutional:      General: She is sleeping. She is not in acute distress.    Appearance: She is ill-appearing.  HENT:     Head: Normocephalic and atraumatic.  Cardiovascular:     Rate and Rhythm: Normal rate.  Pulmonary:     Effort: Pulmonary effort is normal. No respiratory distress.  Abdominal:     General: Abdomen is flat.  Neurological:     Mental Status: She is confused.  Psychiatric:        Mood and Affect: Affect is tearful.     Palliative Assessment/Data: 20-30%   Existing Vynca/ACP Documentation: None  Assessment & Plan:   HPI/Patient Profile:  68 y.o. female with past medical history of cognitive impairment, ESRD on HD MWF, cervical cancer, HTN, HLD, anemia and recent hospitalizations from 6/24-7/22 for hypertensive urgency and delirium, and 8/17-8/24 for C. difficile colitis for which she was treated with p.o. vancomycin  admitted on 05/16/2024 with weakness, nausea, diarrhea and abdominal discomfort. CT abdomen and pelvis showed circumferential wall thickening of rectosigmoid colon suspicious for colitis, Colo uterine fistula, increased bilateral pleural effusions, left > right and area of rounded density in anterior LLL measuring 3.1 cm, and progressive anasarca. General surgery at [facility] was consulted and recommended higher level of care and the ER physician discussed with Dr. Dasie general surgeon at [facility] who agreed to see patient in consult. Patient was started on Dificid  and  Flagyl  for C. difficile colitis. Evaluated by general surgery - urgent surgical intervention is not indicated. PMT consulted to discuss GOC.  SUMMARY OF RECOMMENDATIONS   DNR-comfort Continue comfort care See symptom management orders below Continue attempts to reach out to patient's son to answer questions Palliative medicine will continue to follow  Symptom Management:  Tylenol  650 mg PR every 6 hours as needed mild pain (1-3), fever Artificial tears 1 drop OU 4 times daily as needed dry eyes Robinul  0.2 mg IV every 4 hours as needed excessive secretions Haldol  0.5 mg IV every 4 hours as needed agitation or delirium Dilaudid  0.5 to 2 mg IV every 30 minutes as needed severe pain (7-10), signs/symptoms of distress Ativan  1 mg IV every 4 hours as needed anxiety Zofran  4 mg IV every 6 hours as needed nausea Oxycodone  2.5 mg p.o. every 6 hours as needed pain  Code Status: DNR - Comfort  Prognosis: < 2 weeks  Discharge  Planning: Anticipated Hospital Death  Discussed with: Patient, medical team, nursing team  Thank you for allowing us  to participate in the care of BIANCE MONCRIEF PMT will continue to support holistically.  Billing based on MDM: High  Problems Addressed: One acute or chronic illness or injury that poses a threat to life or bodily function  Risks: Parenteral controlled substances  Detailed review of medical records (labs, imaging, vital signs), medically appropriate exam, discussed with treatment team, counseling and education to patient, family, & staff, documenting clinical information, medication management, coordination of care  [name], NP Palliative Medicine Team  Team Phone # [phone] (Nights/Weekends)  05/11/2021, 8:17 AM

## 2024-06-22 NOTE — Plan of Care (Signed)
  Problem: Education: Goal: Knowledge of General Education information will improve Description: Including pain rating scale, medication(s)/side effects and non-pharmacologic comfort measures Outcome: Progressing   Problem: Health Behavior/Discharge Planning: Goal: Ability to manage health-related needs will improve Outcome: Progressing   Problem: Clinical Measurements: Goal: Ability to maintain clinical measurements within normal limits will improve Outcome: Progressing Goal: Will remain free from infection Outcome: Progressing Goal: Diagnostic test results will improve Outcome: Progressing Goal: Respiratory complications will improve Outcome: Progressing Goal: Cardiovascular complication will be avoided Outcome: Progressing   Problem: Activity: Goal: Risk for activity intolerance will decrease Outcome: Progressing   Problem: Nutrition: Goal: Adequate nutrition will be maintained Outcome: Progressing   Problem: Coping: Goal: Level of anxiety will decrease Outcome: Progressing   Problem: Elimination: Goal: Will not experience complications related to bowel motility Outcome: Progressing Goal: Will not experience complications related to urinary retention Outcome: Progressing   Problem: Pain Managment: Goal: General experience of comfort will improve and/or be controlled Outcome: Progressing   Problem: Safety: Goal: Ability to remain free from injury will improve Outcome: Progressing   Problem: Skin Integrity: Goal: Risk for impaired skin integrity will decrease Outcome: Progressing   Problem: Education: Goal: Ability to describe self-care measures that may prevent or decrease complications (Diabetes Survival Skills Education) will improve Outcome: Progressing Goal: Individualized Educational Video(s) Outcome: Progressing   Problem: Coping: Goal: Ability to adjust to condition or change in health will improve Outcome: Progressing   Problem: Fluid  Volume: Goal: Ability to maintain a balanced intake and output will improve Outcome: Progressing   Problem: Health Behavior/Discharge Planning: Goal: Ability to identify and utilize available resources and services will improve Outcome: Progressing Goal: Ability to manage health-related needs will improve Outcome: Progressing   Problem: Metabolic: Goal: Ability to maintain appropriate glucose levels will improve Outcome: Progressing   Problem: Nutritional: Goal: Maintenance of adequate nutrition will improve Outcome: Progressing Goal: Progress toward achieving an optimal weight will improve Outcome: Progressing   Problem: Skin Integrity: Goal: Risk for impaired skin integrity will decrease Outcome: Progressing   Problem: Tissue Perfusion: Goal: Adequacy of tissue perfusion will improve Outcome: Progressing   Problem: Education: Goal: Understanding of CV disease, CV risk reduction, and recovery process will improve Outcome: Progressing Goal: Individualized Educational Video(s) Outcome: Progressing   Problem: Activity: Goal: Ability to return to baseline activity level will improve Outcome: Progressing   Problem: Cardiovascular: Goal: Ability to achieve and maintain adequate cardiovascular perfusion will improve Outcome: Progressing Goal: Vascular access site(s) Level 0-1 will be maintained Outcome: Progressing   Problem: Health Behavior/Discharge Planning: Goal: Ability to safely manage health-related needs after discharge will improve Outcome: Progressing   Problem: Education: Goal: Knowledge of the prescribed therapeutic regimen will improve Outcome: Progressing   Problem: Coping: Goal: Ability to identify and develop effective coping behavior will improve Outcome: Progressing   Problem: Clinical Measurements: Goal: Quality of life will improve Outcome: Progressing   Problem: Respiratory: Goal: Verbalizations of increased ease of respirations will  increase Outcome: Progressing   Problem: Role Relationship: Goal: Family's ability to cope with current situation will improve Outcome: Progressing Goal: Ability to verbalize concerns, feelings, and thoughts to partner or family member will improve Outcome: Progressing   Problem: Pain Management: Goal: Satisfaction with pain management regimen will improve Outcome: Progressing

## 2024-06-22 NOTE — Progress Notes (Signed)
 PROGRESS NOTE    Tracy Jennings  FMW:[id number] DOB: 12-Jan-1956 DOA: 05/16/2024 PCP: Zachary Idelia LABOR, MD    Brief Narrative:  This 68 yrs old Female with ESRD on HD MWF, Cervical cancer lost to follow up, HTN, and cognitive impairment who presented with stool from vagina, found to have colo-uterine fistula. Initial plan after general surgery evaluation was discharge with outpatient follow-up for surgical resection of fistula, but it became clear that the patient's functional status had declined so much that she was too weak to sit up in a chair for dialysis, and her hospitalization was prolonged. She subsequently then developed bleeding from her colo-uterine fistula, and GI were consulted. On flexible sigmoidoscopy, she was found to have a large malignant appearing mass at the site of the fistula. Post-sigmoidoscopy She developed heavy bleeding and was taken for embolization. Post-embolization, biopsy inconclusive, but patient's functional status continues to decline. Now medical care transitioned to full comfort care.   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 -Due to weakness, failure to thrive, patient unable to progress to discharge (couldn't sit up in  chair for HD) and eventually developed DVT, She was started on Eliquis , and started to have rectal/vaginal bleeding.   -GI consulted, flexible sigmoidoscopy performed on 10/2, and found to have malignant appearing mass at fistula.   -Biopsy led to brisk bleeding, requiring emergency IR embolization of anterior division of left internal iliac artery.   -Bleeding abated and has been stable since then. -Biopsy with debris only, no cellular material.  Immunohistochemical stains still pending.   # There is broad consensus from general surgery, GYN oncology that the patient is not an operative candidate and her fistula is incurable. There is also high clinical suspicion that this is cancer, but that biopsy nor chemo are feasible given her overall malnutrition. and functional status. -Continue to engage family regarding goals of care. - Now medical care is transitioned to full comfort care. - Anticipate hospital Death.     History of cervical clear cell adenoCA: Masslike consolidation of the left lung: Pelvic lymphadenopathy Failure to thrive: As noted previously, the patient underwent chemotherapy and radiation as well as brachytherapy in 2021 for her unusual cervical cancer.   In 2023, GYN oncology notes imply that she had some thickening of the colon and pelvic lymphadenopathy and a PET scan and follow-up imaging was planned, but does not appear to ever been completed and unfortunately no follow-up since then.   Biopsy inconclusive, but strongly clinically suspect cancer. Continues to struffle to stay awake, to eat, and has a high symptom burden with stool from vagina, abdominal pain.   Yesterday's dialysis was halted early.Today HD stopped and Nephrology states, She is not a candidate for HD.   Shared frankly with family Sunday and Monday that there  are no curative options left, and each week in the hospital has seen her weaker and weaker.  Family wish to observe another dialysis session.   Paitent no longer able to participate in goals of care discussions, decision makers are her son Tracy Jennings and sister Tracy Jennings.   Acute metabolic encephalopathy: Dementia At baseline, patient interactive and lives at home, familiar with surroundings but with memory loss.  Here, she has been alert but oriented only to self for most of hospital stay.  Since flex sig on 10/2, she is less responsive, somnolent, due to blood loss, malnutrition primarily.  No focal signs but exam limited due to somnolence.   Acute blood loss anemia due to fistula on chronic anemia of CKD: Baseline 9-10.  Required 2 units transfusion early in hospital stay.   Then transfused 2u PRBC 10/1 and another 2u post-embo on 10/2, stable since.    - Trend hemoglobin. Remains stable above 10.6   ESRD (end stage renal disease) [facility]) -Nephrology following, hemodialysis stopped due to patient agitation. - Nephrology states patient is not a candidate for further hemodialysis. - Now medical care is transitioned to full comfort care.    Acute right internal jugular DVT She has had life-threatening bleeding. Risk of anticoagulation outweighs benefit at this time. - Monitor arm clinically.   PVD (peripheral vascular disease): - Hold aspirin  and Lipitor in setting of poor oral intake   Pleural effusion: IR previously consulted, not enough fluid to tap.   C. difficile colitis: Resolved   Protein-calorie malnutrition, severe: Failure to thrive: Failed trial of tube feeds earlier in this hospital stay due to worsening of diarrhea.   Not a candidate for TPN. - Consult Palliative care. - Consult Dietitian.   Essential hypertension: BP measurements may or may not be accurate, patient lies on side and due to her terminal state, is hard to reposition for accurate BP measurements.    - Continue clonidine  patch, losartan , spironolactone . - IV hydralazine  PRN and labetalol  scheduled   Type 2 diabetes mellitus with other specified  complication (HCC) Glucose low due to FTT. - Stop SSI - Accuchecks once daily   DVT prophylaxis: SCDS Code Status: Full code Family Communication: No family at bed side. Disposition Plan:   Status is: Inpatient Remains inpatient appropriate because:   Now medical care is transitioned to full comfort care.  Consultants:  Nephrology Vascular surgery Palliative care  Procedures:  Antimicrobials:  Anti-infectives (From admission, onward)    Start     Dose/Rate Route Frequency Ordered Stop   06/17/24 1800  vancomycin  (VANCOREADY) IVPB 500 mg/100 mL  Status:  Discontinued        500 mg 100 mL/hr over 60 Minutes Intravenous Every M-W-F (Hemodialysis) 06/15/24 0738 06/17/24 1549   06/16/24 1800  ceFEPIme  (MAXIPIME ) 1 g in sodium chloride  0.9 % 100 mL IVPB  Status:  Discontinued        1 g 200 mL/hr over 30 Minutes Intravenous Every evening 06/15/24 0736 06/17/24 1549   06/15/24 0830  vancomycin  (VANCOREADY) IVPB 750 mg/150 mL        750 mg 150 mL/hr over 60 Minutes Intravenous  Once 06/15/24 0735 06/15/24 1513   06/15/24 0745  ceFEPIme  (MAXIPIME ) 1 g in sodium chloride  0.9 % 100 mL IVPB        1 g 200 mL/hr over 30 Minutes Intravenous Once 06/15/24 0735 06/15/24 0902   06/04/24 1215  vancomycin  (VANCOCIN ) capsule 125 mg        125 mg  Oral Daily 06/04/24 1116 06/05/24 0621   05/27/24 1000  vancomycin  (VANCOCIN ) capsule 125 mg        125 mg Oral Daily 05/21/24 1019 06/04/24 0959   05/20/24 1530  amoxicillin -clavulanate (AUGMENTIN ) 500-125 MG per tablet 1 tablet        1 tablet Oral Every 12 hours 05/20/24 1519 05/29/24 2152   05/17/24 0100  fidaxomicin  (DIFICID ) tablet 200 mg        200 mg Oral 2 times daily 05/17/24 0003 05/26/24 1037   05/17/24 0100  metroNIDAZOLE  (FLAGYL ) IVPB 500 mg  Status:  Discontinued        500 mg 100 mL/hr over 60 Minutes Intravenous Every 12 hours 05/17/24 0003 05/20/24 1519        Subjective: Patient was seen and examined at bedside. She appears  comfortable.   She is alert, following commands. Patient is not a candidate for further hemodialysis.  Now medical care is transitioned to full comfort care.  Objective: Vitals:   06/20/24 1132 06/21/24 0102 06/21/24 1813 06/22/24 0538  BP: (!) 171/57 (!) 189/67 (!) 140/76 (!) 223/78  Pulse: 80 83 91 92  Resp: 17 16 16 16   Temp:    97.6 F (36.4 C)  TempSrc:    Oral  SpO2:  96% 100% 100%  Weight:      Height:        Intake/Output Summary (Last 24 hours) at 06/22/2024 1120 Last data filed at 06/22/2024 0846 Gross per 24 hour  Intake 240 ml  Output --  Net 240 ml   Filed Weights   06/14/24 1224 06/17/24 0810 06/19/24 0806  Weight: 52.2 kg 51.1 kg 48.3 kg    Examination:  General exam: Appears calm and comfortable, deconditioned, not in any acute distress. Respiratory system: Decreased breath sounds, respiratory effort normal.  RR 14 Cardiovascular system: S1 & S2 heard, RRR. No JVD, murmurs, rubs, gallops or clicks. No pedal edema. Gastrointestinal system: Abdomen is non distended, soft and nontender.  Normal bowel sounds heard. Central nervous system: Alert and oriented x1. No focal neurological deficits. Extremities: No edema , no cyanosis, no clubbing. Skin: No rashes, lesions or ulcers Psychiatry: Judgement and insight appear normal. Mood & affect appropriate.     Data Reviewed: I have personally reviewed following labs and imaging studies  CBC: Recent Labs  Lab 06/16/24 0500 06/17/24 0730 06/18/24 0203 06/19/24 0643  WBC 22.5* 23.1* 22.4* 17.7*  NEUTROABS  --   --   --  15.4*  HGB 10.0* 10.5* 10.2* 10.6*  HCT 30.6* 32.3* 32.3* 33.2*  MCV 91.6 92.0 95.6 94.1  PLT 199 193 196 221   Basic Metabolic Panel: Recent Labs  Lab 06/16/24 0500 06/17/24 0730 06/18/24 0203 06/20/24 0310  NA 132* 131* 134* 136  K 3.7 4.0 3.9 3.8  CL 93* 97* 96* 97*  CO2 24 21* 26 27  GLUCOSE 145* 69* 76 79  BUN 36* 44* 24* 30*  CREATININE 4.29* 5.19* 3.52* 3.86*  CALCIUM   8.1* 8.0* 7.9* 7.9*  PHOS  --  4.8*  --   --    GFR: Estimated Creatinine Clearance: 10.9 mL/min (A) (by C-G formula based on SCr of 3.86 mg/dL (H)). Liver Function Tests: Recent Labs  Lab 06/16/24 0500 06/17/24 0730 06/18/24 0203  AST 15  --  14*  ALT 10  --  10  ALKPHOS 55  --  46  BILITOT 0.7  --  1.0  PROT 5.4*  --  5.4*  ALBUMIN  1.9* 1.8* 1.9*   No results for input(s): LIPASE, AMYLASE in the last 168 hours. No results for input(s): AMMONIA in the last 168 hours. Coagulation Profile: No results for input(s): INR, PROTIME in the last 168 hours. Cardiac Enzymes: No results for input(s): CKTOTAL, CKMB, CKMBINDEX, TROPONINI in the last 168 hours. BNP (last 3 results) No results for input(s): PROBNP in the last 8760 hours. HbA1C: No results for input(s): HGBA1C in the last 72 hours. CBG: Recent Labs  Lab 06/17/24 1434 06/17/24 1801 06/18/24 0400 06/19/24 0601 06/19/24 0656  GLUCAP 96 89 67* 71 72   Lipid Profile: No results for input(s): CHOL, HDL, LDLCALC, TRIG, CHOLHDL, LDLDIRECT in the last 72 hours. Thyroid Function Tests: No results for input(s): TSH, T4TOTAL, FREET4, T3FREE, THYROIDAB in the last 72 hours. Anemia Panel: No results for input(s): VITAMINB12, FOLATE, FERRITIN, TIBC, IRON, RETICCTPCT in the last 72 hours.  Sepsis Labs: No results for input(s): PROCALCITON, LATICACIDVEN in the last 168 hours.  Recent Results (from the past 240 hours)  Culture, blood (Routine X 2) w Reflex to ID Panel     Status: None   Collection Time: 06/15/24  1:10 PM   Specimen: BLOOD RIGHT HAND  Result Value Ref Range Status   Specimen Description BLOOD RIGHT HAND  Final   Special Requests   Final    BOTTLES DRAWN AEROBIC AND ANAEROBIC Blood Culture adequate volume   Culture   Final    NO GROWTH 5 DAYS Performed at [facility], [address] [place], [place] [address]    Report Status 06/20/2024 FINAL   Final  Culture, blood (Routine X 2) w Reflex to ID Panel     Status: None   Collection Time: 06/15/24  2:14 PM   Specimen: BLOOD RIGHT ARM  Result Value Ref Range Status   Specimen Description BLOOD RIGHT ARM  Final   Special Requests   Final    BOTTLES DRAWN AEROBIC ONLY Blood Culture results may not be optimal due to an inadequate volume of blood received in culture bottles   Culture   Final    NO GROWTH 5 DAYS Performed at [facility], [address], [place], [place] [address]    Report Status 06/20/2024 FINAL  Final    Radiology Studies: No results found.  Scheduled Meds:  (feeding supplement) PROSource Plus  30 mL Oral BID BM   Chlorhexidine  Gluconate Cloth  6 each Topical Q0600   [START ON 06/23/2024] cloNIDine   0.3 mg Transdermal Q Sun   Gerhardt's butt cream   Topical QID   labetalol   10 mg Intravenous Once   sodium chloride  flush  10-40 mL Intracatheter Q12H   Continuous Infusions:   LOS: 37 days    Time spent: 35 mins.    Darcel Dawley, MD [facility]   If 7PM-7AM, please contact night-coverage

## 2024-06-23 DIAGNOSIS — N824 Other female intestinal-genital tract fistulae: Secondary | ICD-10-CM | POA: Diagnosis not present

## 2024-06-23 DIAGNOSIS — G893 Neoplasm related pain (acute) (chronic): Secondary | ICD-10-CM

## 2024-06-23 MED ORDER — OXYCODONE HCL 5 MG PO TABS
2.5000 mg | ORAL_TABLET | Freq: Three times a day (TID) | ORAL | Status: DC
  Administered 2024-06-23 – 2024-06-25 (×7): 2.5 mg via ORAL
  Filled 2024-06-23 (×7): qty 1

## 2024-06-23 NOTE — Progress Notes (Signed)
 PROGRESS NOTE    Tracy Jennings  FMW:[id number] DOB: 1956-03-18 DOA: 05/16/2024 PCP: Tracy Jennings LABOR, MD    Brief Narrative:  This 68 yrs old Female with ESRD on HD MWF, Cervical cancer lost to follow up, HTN, and cognitive impairment who presented with stool from vagina, found to have colo-uterine fistula. Initial plan after general surgery evaluation was discharge with outpatient follow-up for surgical resection of fistula, but it became clear that the patient's functional status had declined so much that she was too weak to sit up in a chair for dialysis, and her hospitalization was prolonged. She subsequently then developed bleeding from her colo-uterine fistula, and GI were consulted. On flexible sigmoidoscopy, she was found to have a large malignant appearing mass at the site of the fistula. Post-sigmoidoscopy She developed heavy bleeding and was taken for embolization. Post-embolization, biopsy inconclusive, but patient's functional status continues to decline. Now medical care transitioned to full comfort care.   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 -Due to weakness, failure to thrive, patient unable to progress to discharge (couldn't sit up in  chair for HD) and eventually developed DVT, She was started on Eliquis , and started to have rectal/vaginal bleeding.   -GI consulted, flexible sigmoidoscopy performed on 10/2, and found to have malignant appearing mass at fistula.   -Biopsy led to brisk bleeding, requiring emergency IR embolization of anterior division of left internal iliac artery.   -Bleeding abated and has been stable since then. -Biopsy with debris only, no cellular material.  Immunohistochemical stains still pending.   # There is broad consensus from general surgery, GYN oncology that the patient is not an operative candidate and her fistula is incurable. There is also high clinical suspicion that this is cancer, but that biopsy nor chemo are feasible given her overall malnutrition. and functional status. -Continue to engage family regarding goals of care. - Now medical care is transitioned to full comfort care. - Anticipate hospital Death.     History of cervical clear cell adenoCA: Masslike consolidation of the left lung: Pelvic lymphadenopathy Failure to thrive: As noted previously, the patient underwent chemotherapy and radiation as well as brachytherapy in 2021 for her unusual cervical cancer.   In 2023, GYN oncology notes imply that she had some thickening of the colon and pelvic lymphadenopathy and a PET scan and follow-up imaging was planned, but does not appear to ever been completed and unfortunately no follow-up since then.   Biopsy inconclusive, but strongly clinically suspect cancer. Continues to struffle to stay awake, to eat, and has a high symptom burden with stool from vagina, abdominal pain.   Yesterday's dialysis was halted early.Today HD stopped and Nephrology states, She is not a candidate for HD.   Shared frankly with family Sunday and Monday that there  are no curative options left, and each week in the hospital has seen her weaker and weaker.  Family wish to observe another dialysis session.   Paitent no longer able to participate in goals of care discussions, decision makers are her son Tracy Jennings and sister Tracy Jennings.   Acute metabolic encephalopathy: Dementia At baseline, patient interactive and lives at home, familiar with surroundings but with memory loss.  Here, she has been alert but oriented only to self for most of hospital stay.  Since flex sig on 10/2, she is less responsive, somnolent, due to blood loss, malnutrition primarily.  No focal signs but exam limited due to somnolence.   Acute blood loss anemia due to fistula on chronic anemia of CKD: Baseline 9-10.  Required 2 units transfusion early in hospital stay.   Then transfused 2u PRBC 10/1 and another 2u post-embo on 10/2, stable since.    - Trend hemoglobin. Remains stable above 10.6   ESRD (end stage renal disease) [facility]) -Nephrology following, hemodialysis stopped due to patient agitation. - Nephrology states patient is not a candidate for further hemodialysis. - Now medical care is transitioned to full comfort care.    Acute right internal jugular DVT She has had life-threatening bleeding. Risk of anticoagulation outweighs benefit at this time. - Monitor arm clinically.   PVD (peripheral vascular disease): - Hold aspirin  and Lipitor in setting of poor oral intake   Pleural effusion: IR previously consulted, not enough fluid to tap.   C. difficile colitis: Resolved   Protein-calorie malnutrition, severe: Failure to thrive: Failed trial of tube feeds earlier in this hospital stay due to worsening of diarrhea.   Not a candidate for TPN. - Consult Palliative care. - Consult Dietitian.   Essential hypertension: BP measurements may or may not be accurate, patient lies on side and due to her terminal state, is hard to reposition for accurate BP measurements.    - Continue clonidine  patch, losartan , spironolactone . - IV hydralazine  PRN and labetalol  scheduled   Type 2 diabetes mellitus with other specified  complication (HCC) Glucose low due to FTT. - Stop SSI - Accuchecks once daily   DVT prophylaxis: SCDS Code Status: Full code Family Communication: No family at bed side. Disposition Plan:   Status is: Inpatient Remains inpatient appropriate because:   Now medical care is transitioned to full comfort care.  Consultants:  Nephrology Vascular surgery Palliative care  Procedures:  Antimicrobials:  Anti-infectives (From admission, onward)    Start     Dose/Rate Route Frequency Ordered Stop   06/17/24 1800  vancomycin  (VANCOREADY) IVPB 500 mg/100 mL  Status:  Discontinued        500 mg 100 mL/hr over 60 Minutes Intravenous Every M-W-F (Hemodialysis) 06/15/24 0738 06/17/24 1549   06/16/24 1800  ceFEPIme  (MAXIPIME ) 1 g in sodium chloride  0.9 % 100 mL IVPB  Status:  Discontinued        1 g 200 mL/hr over 30 Minutes Intravenous Every evening 06/15/24 0736 06/17/24 1549   06/15/24 0830  vancomycin  (VANCOREADY) IVPB 750 mg/150 mL        750 mg 150 mL/hr over 60 Minutes Intravenous  Once 06/15/24 0735 06/15/24 1513   06/15/24 0745  ceFEPIme  (MAXIPIME ) 1 g in sodium chloride  0.9 % 100 mL IVPB        1 g 200 mL/hr over 30 Minutes Intravenous Once 06/15/24 0735 06/15/24 0902   06/04/24 1215  vancomycin  (VANCOCIN ) capsule 125 mg        125 mg  Oral Daily 06/04/24 1116 06/05/24 0621   05/27/24 1000  vancomycin  (VANCOCIN ) capsule 125 mg        125 mg Oral Daily 05/21/24 1019 06/04/24 0959   05/20/24 1530  amoxicillin -clavulanate (AUGMENTIN ) 500-125 MG per tablet 1 tablet        1 tablet Oral Every 12 hours 05/20/24 1519 05/29/24 2152   05/17/24 0100  fidaxomicin  (DIFICID ) tablet 200 mg        200 mg Oral 2 times daily 05/17/24 0003 05/26/24 1037   05/17/24 0100  metroNIDAZOLE  (FLAGYL ) IVPB 500 mg  Status:  Discontinued        500 mg 100 mL/hr over 60 Minutes Intravenous Every 12 hours 05/17/24 0003 05/20/24 1519        Subjective: Patient was seen and examined at bedside. She appears  comfortable.   She is alert, following commands. Patient is not a candidate for further hemodialysis.  Now medical care is transitioned to full comfort care.  Objective: Vitals:   06/21/24 0102 06/21/24 1813 06/22/24 0538 06/23/24 0417  BP: (!) 189/67 (!) 140/76 (!) 223/78 131/64  Pulse: 83 91 92   Resp: 16 16 16 16   Temp:   97.6 F (36.4 C) (!) 97.4 F (36.3 C)  TempSrc:   Oral   SpO2: 96% 100% 100%   Weight:      Height:        Intake/Output Summary (Last 24 hours) at 06/23/2024 1118 Last data filed at 06/22/2024 1800 Gross per 24 hour  Intake 240 ml  Output --  Net 240 ml   Filed Weights   06/14/24 1224 06/17/24 0810 06/19/24 0806  Weight: 52.2 kg 51.1 kg 48.3 kg    Examination:  General exam: Appears calm and comfortable, deconditioned, not in any acute distress. Respiratory system: Decreased breath sounds, respiratory effort normal.  RR 14 Cardiovascular system: S1 & S2 heard, RRR. No JVD, murmurs, rubs, gallops or clicks. No pedal edema. Gastrointestinal system: Abdomen is non distended, soft and nontender.  Normal bowel sounds heard. Central nervous system: Alert and oriented x1. No focal neurological deficits. Extremities: No edema , no cyanosis, no clubbing. Skin: No rashes, lesions or ulcers Psychiatry: Judgement and insight appear normal. Mood & affect appropriate.     Data Reviewed: I have personally reviewed following labs and imaging studies  CBC: Recent Labs  Lab 06/17/24 0730 06/18/24 0203 06/19/24 0643  WBC 23.1* 22.4* 17.7*  NEUTROABS  --   --  15.4*  HGB 10.5* 10.2* 10.6*  HCT 32.3* 32.3* 33.2*  MCV 92.0 95.6 94.1  PLT 193 196 221   Basic Metabolic Panel: Recent Labs  Lab 06/17/24 0730 06/18/24 0203 06/20/24 0310  NA 131* 134* 136  K 4.0 3.9 3.8  CL 97* 96* 97*  CO2 21* 26 27  GLUCOSE 69* 76 79  BUN 44* 24* 30*  CREATININE 5.19* 3.52* 3.86*  CALCIUM  8.0* 7.9* 7.9*  PHOS 4.8*  --   --    GFR: Estimated Creatinine Clearance:  10.9 mL/min (A) (by C-G formula based on SCr of 3.86 mg/dL (H)). Liver Function Tests: Recent Labs  Lab 06/17/24 0730 06/18/24 0203  AST  --  14*  ALT  --  10  ALKPHOS  --  46  BILITOT  --  1.0  PROT  --  5.4*  ALBUMIN 1.8* 1.9*   No results for input(s): LIPASE, AMYLASE in the last 168 hours. No results for input(s): AMMONIA in the last 168 hours. Coagulation Profile:  No results for input(s): INR, PROTIME in the last 168 hours. Cardiac Enzymes: No results for input(s): CKTOTAL, CKMB, CKMBINDEX, TROPONINI in the last 168 hours. BNP (last 3 results) No results for input(s): PROBNP in the last 8760 hours. HbA1C: No results for input(s): HGBA1C in the last 72 hours. CBG: Recent Labs  Lab 06/17/24 1434 06/17/24 1801 06/18/24 0400 06/19/24 0601 06/19/24 0656  GLUCAP 96 89 67* 71 72   Lipid Profile: No results for input(s): CHOL, HDL, LDLCALC, TRIG, CHOLHDL, LDLDIRECT in the last 72 hours. Thyroid Function Tests: No results for input(s): TSH, T4TOTAL, FREET4, T3FREE, THYROIDAB in the last 72 hours. Anemia Panel: No results for input(s): VITAMINB12, FOLATE, FERRITIN, TIBC, IRON, RETICCTPCT in the last 72 hours.  Sepsis Labs: No results for input(s): PROCALCITON, LATICACIDVEN in the last 168 hours.  Recent Results (from the past 240 hours)  Culture, blood (Routine X 2) w Reflex to ID Panel     Status: None   Collection Time: 06/15/24  1:10 PM   Specimen: BLOOD RIGHT HAND  Result Value Ref Range Status   Specimen Description BLOOD RIGHT HAND  Final   Special Requests   Final    BOTTLES DRAWN AEROBIC AND ANAEROBIC Blood Culture adequate volume   Culture   Final    NO GROWTH 5 DAYS Performed at [facility], [address], [place], [place] [address]    Report Status 06/20/2024 FINAL  Final  Culture, blood (Routine X 2) w Reflex to ID Panel     Status: None   Collection Time: 06/15/24  2:14 PM   Specimen:  BLOOD RIGHT ARM  Result Value Ref Range Status   Specimen Description BLOOD RIGHT ARM  Final   Special Requests   Final    BOTTLES DRAWN AEROBIC ONLY Blood Culture results may not be optimal due to an inadequate volume of blood received in culture bottles   Culture   Final    NO GROWTH 5 DAYS Performed at [facility], [address] [place], [place] [address]    Report Status 06/20/2024 FINAL  Final    Radiology Studies: No results found.  Scheduled Meds:  (feeding supplement) PROSource Plus  30 mL Oral BID BM   Chlorhexidine  Gluconate Cloth  6 each Topical Q0600   cloNIDine   0.3 mg Transdermal Q Sun   Gerhardt's butt cream   Topical QID   labetalol   10 mg Intravenous Once   sodium chloride  flush  10-40 mL Intracatheter Q12H   Continuous Infusions:   LOS: 38 days    Time spent: 35 mins.    Darcel Dawley, MD [facility]   If 7PM-7AM, please contact night-coverage

## 2024-06-23 NOTE — Progress Notes (Signed)
 Palliative Medicine Inpatient Follow Up Note HPI:  68 y.o. female with past medical history of cognitive impairment, ESRD on HD MWF, cervical cancer, HTN, HLD, anemia and recent hospitalizations from 6/24-7/22 for hypertensive urgency and delirium, and 8/17-8/24 for C. difficile colitis for which she was treated with p.o. vancomycin  admitted on 05/16/2024 with weakness, nausea, diarrhea and abdominal discomfort. CT abdomen and pelvis showed circumferential wall thickening of rectosigmoid colon suspicious for colitis, Colo uterine fistula, increased bilateral pleural effusions, left > right and area of rounded density in anterior LLL measuring 3.1 cm, and progressive anasarca. General surgery at [facility] was consulted and recommended higher level of care and the ER physician discussed with Dr. Dasie general surgeon at [facility] who agreed to see patient in consult. Patient was started on Dificid  and Flagyl  for C. difficile colitis. Evaluated by general surgery - urgent surgical intervention is not indicated. PMT consulted to discuss GOC   Today's Discussion 06/23/2024  *Please note that this is a verbal dictation therefore any spelling or grammatical errors are due to the Dragon Medical One system interpretation.  Chart reviewed inclusive of vital signs, progress notes, laboratory results, and diagnostic images.   I met with Tracy Jennings this morning. She is generally uncomfortably in appearance. She shares with me that she is experiencing pain this morning located in her abdomen and back. I shared that I would request she get medication from her RN. She otherwise denies nausea or shortness of breath.  I met this afternoon with patients brother, [name] and son, [name]. We discussed her current clinical situation outside of the room. Patients brother and son feel that she did have some good days this last week whereby she was more interactive and was able to eat more. They shared that this provided them with  hope for the future in terms of potential recovery. Patients brother notes however in thinking about it if Tracy Jennings was more optimized that if she undergoes additional surgical intervention she is unlikely to make it through the surgery itself and even if she does she would be unable to tolerate chemo and or radiation for her malignant fistula. He shares we would be in the same position potentially enhancing the amount of suffrage that Tracy Jennings would need to endure. [name]'s son, [name] agrees.   We discussed additionally that [name] did not like hemodialysis and that if she had to undergo this intervention again she would not want it. She has been clear with her family about this.   Created space and opportunity for patients brother and son to explore thoughts feelings and fears regarding current medical situation. They share understanding of her current prognosis. We reviewed it's wonderful that she has had some good days. I discussed with them the rally which can take place as we near end of life.  We reviewed what to anticipate from a physiological perspective as patients uremia worsens and as she actively transitions throughout the dying process. They express knowing.   We discussed adding around the clock medications to better enhance symptom management which patients brother and son are in agreement with.  We reviewed the option of possible transition to inpatient hospice. I shared this may be a more therapeutic environment and allow family to more easily visit with her. Family agree as they are all predominantly in the [facility] area.   Provided Gone From My Site booklet to better inform family on what to anticipate.  Have spoken to nursing staff to place IV in anticipation of  needing more symptom support in the days ahead.   Questions and concerns addressed/Palliative Support Provided.   Objective Assessment: Vital Signs Vitals:   06/22/24 0538 06/23/24 0417  BP: (!) 223/78 131/64   Pulse: 92   Resp: 16 16  Temp: 97.6 F (36.4 C) (!) 97.4 F (36.3 C)  SpO2: 100%     Intake/Output Summary (Last 24 hours) at 06/23/2024 1223 Last data filed at 06/22/2024 1800 Gross per 24 hour  Intake 120 ml  Output --  Net 120 ml   Last Weight  Most recent update: 06/19/2024  8:07 AM    Weight  48.3 kg (106 lb 7.7 oz)            Gen:  Frail elderly Caucasian F chronically ill HEENT: Dry mucous membranes CV: Regular rate and rhythm  PULM:  On RA, breathing is even and nonlabored ABD: Distended EXT: (+) muscle wasting Neuro:  Aware of self and place  SUMMARY OF RECOMMENDATIONS   DNAR/DNI  Comfort Care  Have requested IV access for medications  Have added oxycodone  2.5mg  Q8H ATC  Additional comfort medications per [facility]  Referral to [facility] for inpatient hospice home in [place]  The PMT will continue to follow along and offer ongoing support ______________________________________________________________________________________ [name] Palliative Medicine Team Team Cell Phone: [phone] Please utilize secure chat with additional questions, if there is no response within 30 minutes please call the above phone number  Time Spent: 63 Billing based on MDM: High  Palliative Medicine Team providers are available by phone from 7am to 7pm daily and can be reached through the team cell phone.  Should this patient require assistance outside of these hours, please call the patient's attending physician.

## 2024-06-23 NOTE — Progress Notes (Signed)
 Tracy Jennings 3W96 [facility] Liaison Note:   Referral received for Hospice Home.    [facility] Liaison to follow up on tomorrow. TOC Notified   Thank you for the opportunity to participate in this patient's plan of care.    Nat Babe, BSN, [employer] [phone]

## 2024-06-24 DIAGNOSIS — N824 Other female intestinal-genital tract fistulae: Secondary | ICD-10-CM | POA: Diagnosis not present

## 2024-06-24 MED ORDER — HYDROXYZINE HCL 10 MG PO TABS
10.0000 mg | ORAL_TABLET | Freq: Two times a day (BID) | ORAL | Status: DC
Start: 2024-06-24 — End: 2024-06-26
  Administered 2024-06-24 – 2024-06-25 (×4): 10 mg via ORAL
  Filled 2024-06-24 (×4): qty 1

## 2024-06-24 NOTE — Plan of Care (Addendum)
  Problem: Cardiovascular: Goal: Ability to achieve and maintain adequate cardiovascular perfusion will improve Outcome: Progressing   Problem: Clinical Measurements: Goal: Quality of life will improve Outcome: Progressing   Problem: Respiratory: Goal: Verbalizations of increased ease of respirations will increase Outcome: Progressing   Problem: Role Relationship: Goal: Family's ability to cope with current situation will improve Outcome: Progressing Goal: Ability to verbalize concerns, feelings, and thoughts to partner or family member will improve Outcome: Progressing   Problem: Pain Management: Goal: Satisfaction with pain management regimen will improve Outcome: Progressing   Problem: Education: Goal: Knowledge of General Education information will improve Description: Including pain rating scale, medication(s)/side effects and non-pharmacologic comfort measures Outcome: Not Progressing   Problem: Health Behavior/Discharge Planning: Goal: Ability to manage health-related needs will improve Outcome: Not Progressing   Problem: Clinical Measurements: Goal: Ability to maintain clinical measurements within normal limits will improve Outcome: Not Progressing Goal: Will remain free from infection Outcome: Not Progressing Goal: Diagnostic test results will improve Outcome: Not Progressing Goal: Respiratory complications will improve Outcome: Not Progressing Goal: Cardiovascular complication will be avoided Outcome: Not Progressing   Problem: Activity: Goal: Risk for activity intolerance will decrease Outcome: Not Progressing   Problem: Nutrition: Goal: Adequate nutrition will be maintained Outcome: Not Progressing   Problem: Coping: Goal: Level of anxiety will decrease Outcome: Not Progressing   Problem: Elimination: Goal: Will not experience complications related to bowel motility Outcome: Not Progressing Goal: Will not experience complications related to  urinary retention Outcome: Not Progressing   Problem: Pain Managment: Goal: General experience of comfort will improve and/or be controlled Outcome: Not Progressing   Problem: Safety: Goal: Ability to remain free from injury will improve Outcome: Not Progressing   Problem: Skin Integrity: Goal: Risk for impaired skin integrity will decrease Outcome: Not Progressing   Problem: Education: Goal: Ability to describe self-care measures that may prevent or decrease complications (Diabetes Survival Skills Education) will improve Outcome: Not Progressing Goal: Individualized Educational Video(s) Outcome: Not Progressing   Problem: Coping: Goal: Ability to adjust to condition or change in health will improve Outcome: Not Progressing   Problem: Fluid Volume: Goal: Ability to maintain a balanced intake and output will improve Outcome: Not Progressing   Problem: Health Behavior/Discharge Planning: Goal: Ability to identify and utilize available resources and services will improve Outcome: Not Progressing Goal: Ability to manage health-related needs will improve Outcome: Not Progressing   Problem: Metabolic: Goal: Ability to maintain appropriate glucose levels will improve Outcome: Not Progressing   Problem: Nutritional: Goal: Maintenance of adequate nutrition will improve Outcome: Not Progressing Goal: Progress toward achieving an optimal weight will improve Outcome: Not Progressing   Problem: Skin Integrity: Goal: Risk for impaired skin integrity will decrease Outcome: Not Progressing   Problem: Tissue Perfusion: Goal: Adequacy of tissue perfusion will improve Outcome: Not Progressing   Problem: Education: Goal: Understanding of CV disease, CV risk reduction, and recovery process will improve Outcome: Not Progressing Goal: Individualized Educational Video(s) Outcome: Not Progressing   Problem: Activity: Goal: Ability to return to baseline activity level will  improve Outcome: Not Progressing   Problem: Health Behavior/Discharge Planning: Goal: Ability to safely manage health-related needs after discharge will improve Outcome: Not Progressing   Problem: Education: Goal: Knowledge of the prescribed therapeutic regimen will improve Outcome: Not Progressing   Problem: Coping: Goal: Ability to identify and develop effective coping behavior will improve Outcome: Not Progressing

## 2024-06-24 NOTE — Progress Notes (Addendum)
 Daily Progress Note   Patient Name: Tracy Jennings       Date: 06/24/2024 DOB: 1956-08-26  Age: 68 y.o. MRN#: [id number] Attending Physician: Leotis Bogus, MD Primary Care Physician: Zachary Idelia LABOR, MD Admit Date: 05/16/2024  Reason for Consultation/Follow-up: Establishing goals of care  Length of Stay: 39  Current Medications: Scheduled Meds:   (feeding supplement) PROSource Plus  30 mL Oral BID BM   Chlorhexidine  Gluconate Cloth  6 each Topical Q0600   cloNIDine   0.3 mg Transdermal Q Sun   Gerhardt's butt cream   Topical QID   oxyCODONE   2.5 mg Oral Q8H    Continuous Infusions:   PRN Meds: acetaminophen  **OR** acetaminophen , artificial tears, glycopyrrolate  **OR** glycopyrrolate  **OR** glycopyrrolate , HYDROmorphone  (DILAUDID ) injection, liver oil-zinc  oxide, LORazepam  **OR** LORazepam  **OR** LORazepam , ondansetron  **OR** ondansetron  (ZOFRAN ) IV, mouth rinse, oxyCODONE   Physical Exam Vitals reviewed.  Constitutional:      General: She is awake. She is not in acute distress.    Appearance: She is ill-appearing.     Comments: mitts  Cardiovascular:     Rate and Rhythm: Normal rate.  Pulmonary:     Effort: Pulmonary effort is normal.  Skin:    General: Skin is dry.             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 difficile colitis 04/29/2024   PVD (peripheral vascular disease) 04/29/2024   GERD (gastroesophageal reflux disease) 04/29/2024   Protein-calorie malnutrition, severe 04/01/2024   Acute delirium 03/19/2024   Hallucination 03/07/2024   TIA (transient ischemic attack) 03/05/2024  Type II diabetes mellitus with renal manifestations (HCC) 03/05/2024   Anemia in ESRD (end-stage renal disease) (HCC) 03/05/2024   Myocardial injury 03/05/2024   Heart murmur, systolic 04/13/2022   Dementia with behavioral disturbance (HCC) 03/29/2022   Headache 03/29/2022   Hypertensive urgency 03/29/2022   Hypertensive emergency 03/28/2022   EKG, abnormal 03/28/2022   History of anesthesia reaction 07/06/2020   Failure to thrive in adult    Pressure injury of skin 06/27/2020   Thrush    Anemia of chronic disease    Palliative care by specialist    Generalized abdominal pain    Acute blood loss anemia    Intractable vomiting 06/21/2020   Hypokalemia 06/21/2020   Hyponatremia 06/21/2020   Hypomagnesemia 06/21/2020   Intractable nausea and vomiting 06/21/2020   Nausea vomiting and diarrhea 06/21/2020   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   Goals of care, counseling/discussion 03/20/2020   History of cervical clear cell adenoCA 03/11/2020   Orthostatic hypotension    Syncope 11/04/2019   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   Type 2 diabetes mellitus with other specified complication (HCC) 11/03/2019   Unable to care for self 11/03/2019   Essential hypertension 11/03/2019   Metabolic acidosis, increased anion gap (IAG)    Generalized weakness    Recurrent syncope 10/28/2019   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 female with past medical history of cognitive impairment, ESRD on HD MWF, cervical cancer, HTN, HLD, anemia and recent hospitalizations from 6/24-7/22 for hypertensive urgency and delirium, and 8/17-8/24 for C. difficile colitis for which she was treated with p.o. vancomycin  admitted on 05/16/2024 with weakness, nausea, diarrhea and abdominal discomfort. CT abdomen and pelvis showed circumferential wall thickening of rectosigmoid colon suspicious for colitis, Colo uterine fistula, increased bilateral pleural effusions, left > right and area of rounded density in anterior LLL measuring 3.1 cm, and progressive anasarca. General surgery at [facility] was consulted and recommended higher level of care and the ER physician discussed with Dr. Dasie general surgeon at [facility] who agreed to see patient in consult. Patient was started on Dificid  and Flagyl  for C. difficile colitis. Evaluated by general surgery - urgent surgical intervention is not indicated. PMT consulted to discuss GOC.   Today's Discussion: Reviewed chart and received update from nurse tech at bedside. Patient remains on scheduled oxycodone  and received two prn doses of dilaudid  over the last 24 hours. Most of her pain is with movement/turns. She has severe itching and currently has mitts on to prevent her from injuring her legs scratching. Will trial hydroxyzine for itching.  10:30 am: Spoke to the patient's brother [name] Glatter by phone. Discussed plan for inpatient hospice evaluation. We discussed the natural disease trajectory and expectations at EOL for ESRD patients who discontinue dialysis. Discussed that the patient likely had a rally this week. [name] shared his other sisters are having a hard time coming to terms with the patient's condition and her being  evaluated for hospice. Emotional support and therapeutic listening provided. [name] asked that I call the patient's son [name]-- left voicemail with callback information.  1:30 pm Notified by [facility] that [name] requested PMT reach out to patient's sisters to discuss patient's condition and prognosis. All family is  not on same page about hospice. Will get PMT provider to follow up tomorrow.  2:00 pm: Spoke to patient's son by phone. He understands his mother is hospice appropriate and does not want her to suffer.  He is willing to override his aunts wishes to ensure his mother is comfortable at end of life. He would like to see if a family meeting can be set up to discuss further so the patient's sisters will hopefully be onboard.  Encouraged [name] to call PMT with questions or concerns. PMT will continue to follow.  Recommendations/Plan: DNR/DNI Full comfort measures Add hydroxyzine 10 mg every 12 hours for itching TOC referral for inpatient hospice- [place] May need another family meeting? Not everybody onboard with hospice. PMT will continue to support    Code Status:    Code Status Orders  (From admission, onward)           Start     Ordered   06/19/24 1540  Do not attempt resuscitation (DNR) - Comfort care  Continuous       Question Answer Comment  If patient has no pulse and is not breathing Do Not Attempt Resuscitation   In Pre-Arrest Conditions (Patient Is Breathing and Has a Pulse) Provide comfort measures. Relieve any mechanical airway obstruction. Avoid transfer unless required for comfort.   Consent: Discussion documented in EHR or advanced directives reviewed      06/19/24 1541         Extensive chart review has been completed prior to seeing the patient including labs, vital signs, imaging, progress/consult notes, orders, medications, and available advance directive documents.  Care plan was discussed with Dr. Leotis  Time spent: 55 minutes  Thank you  for allowing the Palliative Medicine Team to assist in the care of this patient.   Stephane CHRISTELLA Palin, NP  Please contact Palliative Medicine Team phone at [phone] for questions and concerns.

## 2024-06-24 NOTE — Progress Notes (Signed)
 PROGRESS NOTE    Tracy Jennings  FMW:[id number] DOB: 1955/11/07 DOA: 05/16/2024 PCP: Tracy Jennings LABOR, MD    Brief Narrative:  This 68 yrs old Female with ESRD on HD MWF, Cervical cancer lost to follow up, HTN, and cognitive impairment who presented with stool from vagina, found to have colo-uterine fistula. Initial plan after general surgery evaluation was discharge with outpatient follow-up for surgical resection of fistula, but it became clear that the patient's functional status had declined so much that she was too weak to sit up in a chair for dialysis, and her hospitalization was prolonged. She subsequently then developed bleeding from her colo-uterine fistula, and GI were consulted. On flexible sigmoidoscopy, she was found to have a large malignant appearing mass at the site of the fistula. Post-sigmoidoscopy She developed heavy bleeding and was taken for embolization. Post-embolization, biopsy inconclusive, but patient's functional status continues to decline. Now medical care transitioned to full comfort care.   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 -Due to weakness, failure to thrive, patient unable to progress to discharge (couldn't sit up in  chair for HD) and eventually developed DVT, She was started on Eliquis , and started to have rectal/vaginal bleeding.   -GI consulted, flexible sigmoidoscopy performed on 10/2, and found to have malignant appearing mass at fistula.   -Biopsy led to brisk bleeding, requiring emergency IR embolization of anterior division of left internal iliac artery.   -Bleeding abated and has been stable since then. -Biopsy with debris only, no cellular material.  Immunohistochemical stains still pending.   # There is broad consensus from general surgery, GYN oncology that the patient is not an operative candidate and her fistula is incurable. There is also high clinical suspicion that this is cancer, but that biopsy nor chemo are feasible given her overall malnutrition. and functional status. -Continue to engage family regarding goals of care. - Now medical care is transitioned to full comfort care. - Anticipate hospital Death or discharge to inpatient hospice.     History of cervical clear cell adenoCA: Masslike consolidation of the left lung: Pelvic lymphadenopathy Failure to thrive: As noted previously, the patient underwent chemotherapy and radiation as well as brachytherapy in 2021 for her unusual cervical cancer.   In 2023, GYN oncology notes imply that she had some thickening of the colon and pelvic lymphadenopathy and a PET scan and follow-up imaging was planned, but does not appear to ever been completed and unfortunately no follow-up since then.   Biopsy inconclusive, but strongly clinically suspect cancer. Continues to struffle to stay awake, to eat, and has a high symptom burden with stool from vagina, abdominal pain.   Yesterday's dialysis was halted early.Today HD stopped and Nephrology states, She is not a candidate for HD.   Shared frankly with family  Sunday and Monday that there are no curative options left, and each week in the hospital has seen her weaker and weaker.  Family wish to observe  another dialysis session.  Paitent no longer able to participate in goals of care discussions, decision makers are her son Tracy Jennings and sister Tracy Jennings.   Acute metabolic encephalopathy: Dementia At baseline, patient interactive and lives at home, familiar with surroundings but with memory loss.  Here, she has been alert but oriented only to self for most of hospital stay.  Since flex sig on 10/2, she is less responsive, somnolent, due to blood loss, malnutrition primarily.  No focal signs but exam limited due to somnolence.   Acute blood loss anemia due to fistula on chronic anemia of CKD: Baseline 9-10.  Required 2 units transfusion early in hospital stay.   Then transfused 2u PRBC 10/1 and another 2u post-embo on 10/2, stable since.    - Trend hemoglobin. Remains stable above 10.6   ESRD (end stage renal disease) (HCC) -Nephrology following, hemodialysis stopped due to patient agitation. - Nephrology states patient is not a candidate for further hemodialysis. - Now medical care is transitioned to full comfort care.    Acute right internal jugular DVT She has had life-threatening bleeding. Risk of anticoagulation outweighs benefit at this time. - Monitor arm clinically.   PVD (peripheral vascular disease): - Hold aspirin and Lipitor in setting of poor oral intake   Pleural effusion: IR previously consulted, not enough fluid to tap.   C. difficile colitis: Resolved   Protein-calorie malnutrition, severe: Failure to thrive: Failed trial of tube feeds earlier in this hospital stay due to worsening of diarrhea.   Not a candidate for TPN. - Consult Palliative care. - Consult Dietitian.   Essential hypertension: BP measurements may or may not be accurate, patient lies on side and due to her terminal state, is hard to reposition for accurate BP measurements.    - Continue clonidine patch, losartan, spironolactone. - IV hydralazine PRN and labetalol scheduled   Type 2 diabetes mellitus  with other specified complication (HCC) Glucose low due to FTT. - Stop SSI - Accuchecks once daily   DVT prophylaxis: SCDS Code Status: Full code Family Communication: No family at bed side. Disposition Plan:   Status is: Inpatient Remains inpatient appropriate because:  Now medical care is transitioned to full comfort care.  Consultants:  Nephrology Vascular surgery Palliative care  Procedures:  Antimicrobials:  Anti-infectives (From admission, onward)    Start     Dose/Rate Route Frequency Ordered Stop   06/17/24 1800  vancomycin (VANCOREADY) IVPB 500 mg/100 mL  Status:  Discontinued        500 mg 100 mL/hr over 60 Minutes Intravenous Every M-W-F (Hemodialysis) 06/15/24 0738 06/17/24 1549   06/16/24 1800  ceFEPIme (MAXIPIME) 1 g in sodium chloride 0.9 % 100 mL IVPB  Status:  Discontinued        1 g 200 mL/hr over 30 Minutes Intravenous Every evening 06/15/24 0736 06/17/24 1549   06/15/24 0830  vancomycin (VANCOREADY) IVPB 750 mg/150 mL        75 0 mg 150 mL/hr over 60 Minutes Intravenous  Once 06/15/24 0735 06/15/24 1513   06/15/24 0745  ceFEPIme  (MAXIPIME ) 1 g in sodium chloride  0.9 % 100 mL IVPB        1 g 200 mL/hr over 30 Minutes Intravenous Once 06/15/24 0735 06/15/24 0902   06/04/24 1215  vancomycin  (VANCOCIN ) capsule 125 mg  125 mg Oral Daily 06/04/24 1116 06/05/24 0621   05/27/24 1000  vancomycin  (VANCOCIN ) capsule 125 mg        125 mg Oral Daily 05/21/24 1019 06/04/24 0959   05/20/24 1530  amoxicillin -clavulanate (AUGMENTIN ) 500-125 MG per tablet 1 tablet        1 tablet Oral Every 12 hours 05/20/24 1519 05/29/24 2152   05/17/24 0100  fidaxomicin  (DIFICID ) tablet 200 mg        200 mg Oral 2 times daily 05/17/24 0003 05/26/24 1037   05/17/24 0100  metroNIDAZOLE  (FLAGYL ) IVPB 500 mg  Status:  Discontinued        500 mg 100 mL/hr over 60 Minutes Intravenous Every 12 hours 05/17/24 0003 05/20/24 1519        Subjective: Patient was seen and examined at  bedside. She appears comfortable.   She is alert, following commands. Patient is not a candidate for further hemodialysis.  Now medical care is transitioned to full comfort care.  Objective: Vitals:   06/21/24 1813 06/22/24 0538 06/23/24 0417 06/24/24 0630  BP: (!) 140/76 (!) 223/78 131/64 (!) 142/71  Pulse: 91 92  70  Resp: 16 16 16 17   Temp:  97.6 F (36.4 C) (!) 97.4 F (36.3 C) (!) 97.4 F (36.3 C)  TempSrc:  Oral  Oral  SpO2: 100% 100%  94%  Weight:      Height:        Intake/Output Summary (Last 24 hours) at 06/24/2024 1340 Last data filed at 06/24/2024 0845 Gross per 24 hour  Intake 390 ml  Output --  Net 390 ml   Filed Weights   06/14/24 1224 06/17/24 0810 06/19/24 0806  Weight: 52.2 kg 51.1 kg 48.3 kg    Examination:  General exam: Appears calm and comfortable, deconditioned, not in any acute distress. Respiratory system: Decreased breath sounds, respiratory effort normal.  RR 12 Cardiovascular system: S1 & S2 heard, RRR. No JVD, murmurs, rubs, gallops or clicks. No pedal edema. Gastrointestinal system: Abdomen is non distended, soft and nontender.  Normal bowel sounds heard. Central nervous system: Alert and oriented x1. No focal neurological deficits. Extremities: No edema , no cyanosis, no clubbing. Skin: No rashes, lesions or ulcers Psychiatry: Judgement and insight appear normal. Mood & affect appropriate.     Data Reviewed: I have personally reviewed following labs and imaging studies  CBC: Recent Labs  Lab 06/18/24 0203 06/19/24 0643  WBC 22.4* 17.7*  NEUTROABS  --  15.4*  HGB 10.2* 10.6*  HCT 32.3* 33.2*  MCV 95.6 94.1  PLT 196 221   Basic Metabolic Panel: Recent Labs  Lab 06/18/24 0203 06/20/24 0310  NA 134* 136  K 3.9 3.8  CL 96* 97*  CO2 26 27  GLUCOSE 76 79  BUN 24* 30*  CREATININE 3.52* 3.86*  CALCIUM  7.9* 7.9*   GFR: Estimated Creatinine Clearance: 10.9 mL/min (A) (by C-G formula based on SCr of 3.86 mg/dL (H)). Liver  Function Tests: Recent Labs  Lab 06/18/24 0203  AST 14*  ALT 10  ALKPHOS 46  BILITOT 1.0  PROT 5.4*  ALBUMIN 1.9*   No results for input(s): LIPASE, AMYLASE in the last 168 hours. No results for input(s): AMMONIA in the last 168 hours. Coagulation Profile: No results for input(s): INR, PROTIME in the last 168 hours. Cardiac Enzymes: No results for input(s): CKTOTAL, CKMB, CKMBINDEX, TROPONINI in the last 168 hours. BNP (last 3 results) No results for input(s): PROBNP in the last 8760 hours. HbA1C:  No results for input(s): HGBA1C in the last 72 hours. CBG: Recent Labs  Lab 06/17/24 1434 06/17/24 1801 06/18/24 0400 06/19/24 0601 06/19/24 0656  GLUCAP 96 89 67* 71 72   Lipid Profile: No results for input(s): CHOL, HDL, LDLCALC, TRIG, CHOLHDL, LDLDIRECT in the last 72 hours. Thyroid Function Tests: No results for input(s): TSH, T4TOTAL, FREET4, T3FREE, THYROIDAB in the last 72 hours. Anemia Panel: No results for input(s): VITAMINB12, FOLATE, FERRITIN, TIBC, IRON, RETICCTPCT in the last 72 hours.  Sepsis Labs: No results for input(s): PROCALCITON, LATICACIDVEN in the last 168 hours.  Recent Results (from the past 240 hours)  Culture, blood (Routine X 2) w Reflex to ID Panel     Status: None   Collection Time: 06/15/24  1:10 PM   Specimen: BLOOD RIGHT HAND  Result Value Ref Range Status   Specimen Description BLOOD RIGHT HAND  Final   Special Requests   Final    BOTTLES DRAWN AEROBIC AND ANAEROBIC Blood Culture adequate volume   Culture   Final    NO GROWTH 5 DAYS Performed at [facility], [address], [place], [place] [address]    Report Status 06/20/2024 FINAL  Final  Culture, blood (Routine X 2) w Reflex to ID Panel     Status: None   Collection Time: 06/15/24  2:14 PM   Specimen: BLOOD RIGHT ARM  Result Value Ref Range Status   Specimen Description BLOOD RIGHT ARM  Final   Special Requests    Final    BOTTLES DRAWN AEROBIC ONLY Blood Culture results may not be optimal due to an inadequate volume of blood received in culture bottles   Culture   Final    NO GROWTH 5 DAYS Performed at [facility], [address] [place], [place] [address]    Report Status 06/20/2024 FINAL  Final    Radiology Studies: No results found.  Scheduled Meds:  (feeding supplement) PROSource Plus  30 mL Oral BID BM   Chlorhexidine  Gluconate Cloth  6 each Topical Q0600   cloNIDine   0.3 mg Transdermal Q Sun   Gerhardt's butt cream   Topical QID   hydrOXYzine  10 mg Oral Q12H   oxyCODONE   2.5 mg Oral Q8H   Continuous Infusions:   LOS: 39 days    Time spent: 35 mins.    Darcel Dawley, MD [facility]   If 7PM-7AM, please contact night-coverage

## 2024-06-25 DIAGNOSIS — N824 Other female intestinal-genital tract fistulae: Secondary | ICD-10-CM | POA: Diagnosis not present

## 2024-06-25 DIAGNOSIS — R4589 Other symptoms and signs involving emotional state: Secondary | ICD-10-CM

## 2024-06-25 NOTE — Progress Notes (Signed)
 [id number]   Received request from Transitions of Care Manager for family interest in Hospice Home. Chart reviewed and spoke with family to acknowledge referral. Family agreeable to transfer today. Son confirmed will sign consent in person today.   Transition of Care Manager aware. RN please call report to [phone] [facility]) prior to patient leaving the unit.   Please send signed and completed DNR with patient at discharge  Please do not hesitate to call with questions.   Thank you.  Dick Hansen -BSN [employer]  [phone]

## 2024-06-25 NOTE — Progress Notes (Signed)
 Daily Progress Note   Date: 06/25/2024   Patient Name: Tracy Jennings  DOB: 12/30/1955  MRN: [id number]  Age / Sex: 68 y.o., female  Attending Physician: Leotis Bogus, MD Primary Care Physician: Zachary Idelia LABOR, MD Admit Date: 05/16/2024 Length of Stay: 40 days  Reason for Follow-up: Establishing goals of care, Non pain symptom management, Pain control, Psychosocial/spiritual support, and Terminal Care  Past Medical History:  Diagnosis Date   Anemia 04/2018   low iron. to be started on supplements   Cervical cancer (HCC)    CKD (chronic kidney disease)    Stage IV   Complication of anesthesia    receceived too much anesthesia, that she was in coma for a couple days    COVID-19 virus detected 10/28/2019   Diabetes mellitus without complication (HCC)    type II   ESRD (end stage renal disease) (HCC)    Heart murmur    followed as a child only   HSIL (high grade squamous intraepithelial lesion) on Pap smear of cervix    Hyperlipidemia associated with type 2 diabetes mellitus (HCC)    Hypertension    Pancreatitis    Peripheral vascular disease     Subjective:   Subjective: Chart Reviewed. Updates received. Patient Assessed. Created space and opportunity for patient  and family to explore thoughts and feelings regarding current medical situation.  Today's Discussion: Today before meeting with the patient/family, I reviewed vital signs, nursing flowsheets, medication administrations record, labs, and imaging. No labs drawn due to comfort care status.  Vital signs earlier this morning include blood pressure 193/63.  Comfort medications administered include 3 doses of Dilaudid  yesterday and 3 doses thus far today at 00:01 and, 3:01 AM, and 10:08 AM.  Receiving Atarax scheduled every 12 hours for itching, has received 3 doses.  No Ativan  or Robinul  received in the last 24 hours.  Is receiving scheduled oxycodone  every 8 hours.  Will continue parenteral opioids due to need thus  far and anticipated need moving forward.  Today I saw the patient at bedside, she is peacefully sleeping and I elected to not wake her.  Multiple family members are present including son Tracy Jennings, brother Tracy Jennings.  Per previous notes some family members are struggling to except comfort care/hospice care.  Family had requested us  to meet with other family nurse to explain clinical situation and options.  We excused ourselves to I conference room to have a discussion/family meeting about the current situation.  Spent time reviewing the patient's clinical situation.  In summary, she has been admitted for 40 days.  Previously her big hurdle was poor nutrition, poor oral intake.  This improved and there was some hope that the patient could go to SNF/rehab to work on strengthening and potentially recover some amount of a functional and quality life.  However, things changed significantly when she began having vaginal bleeding and that was found to be due to a large cancerous mass in her sigmoid colon abutting a Coloutero fistula which bled profusely with simple biopsy.  We reviewed that she is not a candidate for any kind of cancer treatment including surgery, chemotherapy, radiation; also unlikely candidate for attempted repeat biopsy because of the profound bleeding after the last biopsy.  Also discussed the patient's difficulty in tolerating dialysis with the last 2 treatments cut abruptly short.  In summary, the patient is approaching end-of-life regardless of how we proceed with her care.  We spent time again reviewing elements of comfort care,  which she is currently receiving per her son/decision makers request.  See previous notes for details.  We discussed the option to transfer to inpatient hospice for more peaceful and comfortable environment for ongoing comfort care at end-of-life. I again described hospice as a service for patients who have a life expectancy of 6 months or less. The goal of  hospice is the preservation of dignity and quality at the end phases of life. Under hospice care, the focus changes from curative to symptom relief. I explained the three setting where hospice services can be provided including the home, at a living facility (such as LTC SNF, Assisted Living, etc), and a hospice facility. I explained that acceptance to hospice in any specific location is the final decision of the hospice medical director and bed availability, if applicable. They verbalized understanding.  Family is understandably tearful.  After significant review of the above topics family is in agreement to continue comfort care, request evaluation for placement at inpatient hospice with [facility] in [place].  I shared that I would follow-up with the patient tomorrow if she remains admitted, will contact TOC and hospice liaison for evaluation.  I encouraged them to call for any questions or concerns and ensured they had the palliative medicine contact number. I provided emotional and general support through therapeutic listening, empathy, sharing of stories, and other techniques. I answered all questions and addressed all concerns to the best of my ability.  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 difficile colitis  PVD (peripheral vascular disease)  Protein-calorie malnutrition, severe   Vital Signs:  BP (!) 193/63 (BP Location: Right Arm)   Pulse 70   Temp (!) 97.4 F (36.3 C) (Oral)   Resp 17   Ht 5' 3 (1.6 m)   Wt 48.3 kg   LMP  (LMP Unknown)   SpO2 94%   BMI 18.86 kg/m   Physical Exam Vitals and nursing note reviewed.   Constitutional:      General: She is sleeping. She is not in acute distress.    Appearance: She is ill-appearing.     Comments: Peacefully sleeping  HENT:     Head: Normocephalic and atraumatic.  Cardiovascular:     Rate and Rhythm: Normal rate.  Pulmonary:     Effort: Pulmonary effort is normal. No respiratory distress.  Abdominal:     General: Abdomen is flat.  Neurological:     Mental Status: She is confused.  Psychiatric:        Mood and Affect: Affect is tearful.     Palliative Assessment/Data: 20-30%   Existing Vynca/ACP Documentation: None  Advanced Care Planning:   Pertinent diagnosis: ESRD/HD dependent, likely colon cancer/untreatable, poor nutrition, failure to thrive  The patient and/or family consented to a voluntary Advance Care Planning Conversation in person. Individuals present for the conversation: Patient's son Tracy Jennings, patient's brother Tracy, patient's Sister Tracy Jennings; Tracy Kays, NP  Summary of the conversation: Discussed clinical journey up to this point, the untreatable nature of her illness, end-of-life is approaching, options moving forward and continue comfort care and hospice transfer versus reengaging with supportive care  Outcome of the conversations and/or documents completed: Remain DNR-comfort, continue comfort care  inpatient, request evaluation for inpatient hospice in [place]  I spent 20 minutes providing separately identifiable ACP services with the patient and/or surrogate decision maker in a voluntary, in-person conversation discussing the patient's wishes and goals as detailed in the above note.  Assessment & Plan:   HPI/Patient Profile:  68 y.o. female with past medical history of cognitive impairment, ESRD on HD MWF, cervical cancer, HTN, HLD, anemia and recent hospitalizations from 6/24-7/22 for hypertensive urgency and delirium, and 8/17-8/24 for C. difficile colitis for which she was treated with p.o. vancomycin  admitted on 05/16/2024  with weakness, nausea, diarrhea and abdominal discomfort. CT abdomen and pelvis showed circumferential wall thickening of rectosigmoid colon suspicious for colitis, Colo uterine fistula, increased bilateral pleural effusions, left > right and area of rounded density in anterior LLL measuring 3.1 cm, and progressive anasarca. General surgery at [facility] was consulted and recommended higher level of care and the ER physician discussed with Dr. Dasie general surgeon at [facility] who agreed to see patient in consult. Patient was started on Dificid  and Flagyl  for C. difficile colitis. Evaluated by general surgery - urgent surgical intervention is not indicated. PMT consulted to discuss GOC.  SUMMARY OF RECOMMENDATIONS   DNR-comfort Continue comfort care See symptom management orders below Request evaluation by [facility] collective for inpatient hospice in [facility] Palliative medicine will continue to follow  Symptom Management:  Tylenol  650 mg PR every 6 hours as needed mild pain (1-3), fever Artificial tears 1 drop OU 4 times daily as needed dry eyes Robinul  0.2 mg IV every 4 hours as needed excessive secretions Haldol  0.5 mg IV every 4 hours as needed agitation or delirium Dilaudid  0.5 to 2 mg IV every 30 minutes as needed severe pain (7-10), signs/symptoms of distress Ativan  1 mg IV every 4 hours as needed anxiety Zofran  4 mg IV every 6 hours as needed nausea Oxycodone  2.5 mg p.o. every 8 hours scheduled STOPPED (redundant) oxycodone  2.5 mg p.o. every 6 hours as needed pain  Code Status: DNR - Comfort  Prognosis: Hours - Days  Discharge Planning: Hospice facility  Discussed with: Patient, patient's family, medical team, nursing team, [facility] team, hospice liaison  Thank you for allowing us  to participate in the care of Tracy Jennings PMT will continue to support holistically.  Billing based on MDM: High  Problems Addressed: One acute or chronic illness or injury that poses a threat to  life or bodily function  Risks: Parenteral controlled substances  Detailed review of medical records (labs, imaging, vital signs), medically appropriate exam, discussed with treatment team, counseling and education to patient, family, & staff, documenting clinical information, medication management, coordination of care  Tracy Kays, NP Palliative Medicine Team  Team Phone # [phone] (Nights/Weekends)  05/11/2021, 8:17 AM

## 2024-06-25 NOTE — Progress Notes (Signed)
 PROGRESS NOTE    Tracy Jennings  FMW:[id number] DOB: 08-28-1956 DOA: 05/16/2024 PCP: Zachary Idelia LABOR, MD    Brief Narrative:  This 68 yrs old Female with ESRD on HD MWF, Cervical cancer lost to follow up, HTN, and cognitive impairment who presented with stool from vagina, found to have colo-uterine fistula. Initial plan after general surgery evaluation was discharge with outpatient follow-up for surgical resection of fistula, but it became clear that the patient's functional status had declined so much that she was too weak to sit up in a chair for dialysis, and her hospitalization was prolonged. She subsequently then developed bleeding from her colo-uterine fistula, and GI were consulted. On flexible sigmoidoscopy, she was found to have a large malignant appearing mass at the site of the fistula. Post-sigmoidoscopy She developed heavy bleeding and was taken for embolization. Post-embolization, biopsy inconclusive, but patient's functional status continues to decline. Now medical care transitioned to full comfort care.   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 difficile colitis   Pleural effusion  Colo- uterine fistula due to malignant-appearing mass: Acute colo-uterine bleeding from mass : There is broad consensus from general surgery, GYN oncology that the patient is not an operative candidate and her fistula is incurable.  There is also high clinical suspicion that this is cancer, but  that biopsy nor chemo are feasible given her overall malnutrition. and functional status. -Now medical care is transitioned to full comfort care. -Anticipate hospital Death or discharge to inpatient hospice.     History of cervical clear cell adenoCA: Masslike consolidation of the left lung: Pelvic lymphadenopathy Failure to thrive: Now medical care is transitioned to full comfort care. Anticipate hospital Death or discharge to inpatient hospice.   Acute metabolic encephalopathy: Dementia At baseline, patient interactive and lives at home, familiar with surroundings but with memory loss.   Here, she has been alert but oriented only to self for most of hospital stay.     Acute blood loss anemia due to fistula on chronic anemia of CKD: Baseline 9-10.  Required 2 units transfusion early in hospital stay.   Then transfused 2u PRBC 10/1 and another 2u post-embo on 10/2, stable since.    - Trend hemoglobin. Remains stable above 10.6   ESRD (end stage renal disease) [facility]) -Nephrology following, hemodialysis stopped due to patient agitation. - Nephrology states patient is not a candidate for further hemodialysis. - Now medical care is transitioned to full comfort care.    Acute right internal jugular DVT She has had life-threatening bleeding. Risk of anticoagulation outweighs benefit at this time. - Monitor arm clinically.   PVD (peripheral vascular disease): - Hold aspirin  and Lipitor in setting of poor oral intake   Pleural effusion: IR previously consulted, not enough fluid to tap.   C. difficile colitis: Resolved   Protein-calorie malnutrition, severe: Failure to thrive: Failed trial of tube feeds earlier in this hospital stay due to worsening of diarrhea.   Not a candidate for TPN. -  Consult Palliative care. - Consult Dietitian.   Essential hypertension: BP measurements may or may not be accurate, patient lies on side and due to her terminal state, is hard to reposition  for accurate BP measurements.    - Continue clonidine  patch, losartan , spironolactone . - IV hydralazine  PRN and labetalol  scheduled   Type 2 diabetes mellitus with other specified complication (HCC) Glucose low due to FTT. - Stop SSI - Accuchecks once daily   DVT prophylaxis: SCDS Code Status: Full code Family Communication: No family at bed side. Disposition Plan:   Status is: Inpatient Remains inpatient appropriate because:  Now medical care is transitioned to full comfort care.  Consultants:  Nephrology Vascular surgery Palliative care  Procedures:  Antimicrobials:  Anti-infectives (From admission, onward)    Start     Dose/Rate Route Frequency Ordered Stop   06/17/24 1800  vancomycin  (VANCOREADY) IVPB 500 mg/100 mL  Status:  Discontinued        500 mg 100 mL/hr over 60 Minutes Intravenous Every M-W-F (Hemodialysis) 06/15/24 0738 06/17/24 1549   06/16/24 1800  ceFEPIme  (MAXIPIME ) 1 g in sodium chloride  0.9 % 100 mL IVPB  Status:  Discontinued        1 g 200 mL/hr over 30 Minutes Intravenous Every evening 06/15/24 0736 06/17/24 1549   06/15/24 0830  vancomycin  (VANCOREADY) IVPB 750 mg/150 mL        750 mg 150 mL/hr over 60 Minutes Intravenous  Once 06/15/24 0735 06/15/24 1513   06/15/24 0745  ceFEPIme  (MAXIPIME ) 1 g in sodium chloride  0.9 % 100 mL IVPB        1 g 200 mL/hr over 30 Minutes Intravenous Once 06/15/24 0735 06/15/24 0902   06/04/24 1215  vancomycin  (VANCOCIN ) capsule 125 mg        125 mg Oral Daily 06/04/24 1116 06/05/24 0621   05/27/24 1000  vancomycin  (VANCOCIN ) capsule 125 mg        125 mg Oral Daily 05/21/24 1019 06/04/24 0959   05/20/24 1530  amoxicillin -clavulanate (AUGMENTIN ) 500-125 MG per tablet 1 tablet        1 tablet Oral Every 12 hours 05/20/24 1519 05/29/24 2152   05/17/24 0100  fidaxomicin  (DIFICID ) tablet 200 mg        200 mg Oral 2 times daily 05/17/24 0003 05/26/24 1037   05/17/24 0100  metroNIDAZOLE  (FLAGYL ) IVPB 500 mg  Status:   Discontinued        500 mg 100 mL/hr over 60 Minutes Intravenous Every 12 hours 05/17/24 0003 05/20/24 1519        Subjective: Patient was seen and examined at bedside. She appears comfortable.   She is alert, following commands. Patient is not a candidate for further hemodialysis.  Now medical care is transitioned to full comfort care.  Objective: Vitals:   06/22/24 0538 06/23/24 0417 06/24/24 0630 06/25/24 0630  BP: (!) 223/78 131/64 (!) 142/71 (!) 193/63  Pulse: 92  70   Resp: 16 16 17    Temp: 97.6 F (36.4 C) (!) 97.4 F (36.3 C) (!) 97.4 F (36.3 C)   TempSrc: Oral  Oral   SpO2: 100%  94%   Weight:      Height:        Intake/Output Summary (Last 24 hours) at 06/25/2024 1439 Last data filed at 06/25/2024 1230 Gross per 24 hour  Intake 240 ml  Output --  Net 240 ml   Filed Weights   06/14/24 1224 06/17/24 0810 06/19/24 0806  Weight: 52.2  kg 51.1 kg 48.3 kg    Examination:  General exam: Appears calm and comfortable, deconditioned, not in any acute distress. Respiratory system: Decreased breath sounds, respiratory effort normal.  RR 12 Cardiovascular system: S1 & S2 heard, RRR. No JVD, murmurs, rubs, gallops or clicks. No pedal edema. Gastrointestinal system: Abdomen is non distended, soft and nontender.  Normal bowel sounds heard. Central nervous system: Alert and oriented x1. No focal neurological deficits. Extremities: No edema , no cyanosis, no clubbing. Skin: No rashes, lesions or ulcers Psychiatry: Judgement and insight appear normal. Mood & affect appropriate.     Data Reviewed: I have personally reviewed following labs and imaging studies  CBC: Recent Labs  Lab 06/19/24 0643  WBC 17.7*  NEUTROABS 15.4*  HGB 10.6*  HCT 33.2*  MCV 94.1  PLT 221   Basic Metabolic Panel: Recent Labs  Lab 06/20/24 0310  NA 136  K 3.8  CL 97*  CO2 27  GLUCOSE 79  BUN 30*  CREATININE 3.86*  CALCIUM  7.9*   GFR: Estimated Creatinine Clearance: 10.9  mL/min (A) (by C-G formula based on SCr of 3.86 mg/dL (H)). Liver Function Tests: No results for input(s): AST, ALT, ALKPHOS, BILITOT, PROT, ALBUMIN in the last 168 hours.  No results for input(s): LIPASE, AMYLASE in the last 168 hours. No results for input(s): AMMONIA in the last 168 hours. Coagulation Profile: No results for input(s): INR, PROTIME in the last 168 hours. Cardiac Enzymes: No results for input(s): CKTOTAL, CKMB, CKMBINDEX, TROPONINI in the last 168 hours. BNP (last 3 results) No results for input(s): PROBNP in the last 8760 hours. HbA1C: No results for input(s): HGBA1C in the last 72 hours. CBG: Recent Labs  Lab 06/19/24 0601 06/19/24 0656  GLUCAP 71 72   Lipid Profile: No results for input(s): CHOL, HDL, LDLCALC, TRIG, CHOLHDL, LDLDIRECT in the last 72 hours. Thyroid Function Tests: No results for input(s): TSH, T4TOTAL, FREET4, T3FREE, THYROIDAB in the last 72 hours. Anemia Panel: No results for input(s): VITAMINB12, FOLATE, FERRITIN, TIBC, IRON, RETICCTPCT in the last 72 hours.  Sepsis Labs: No results for input(s): PROCALCITON, LATICACIDVEN in the last 168 hours.  No results found for this or any previous visit (from the past 240 hours).   Radiology Studies: No results found.  Scheduled Meds:  (feeding supplement) PROSource Plus  30 mL Oral BID BM   Chlorhexidine  Gluconate Cloth  6 each Topical Q0600   cloNIDine   0.3 mg Transdermal Q Sun   Gerhardt's butt cream   Topical QID   hydrOXYzine  10 mg Oral Q12H   oxyCODONE   2.5 mg Oral Q8H   Continuous Infusions:   LOS: 40 days    Time spent: 35 mins.    Darcel Dawley, MD [facility]   If 7PM-7AM, please contact night-coverage

## 2024-06-25 NOTE — Plan of Care (Signed)
  Problem: Clinical Measurements: Goal: Cardiovascular complication will be avoided Outcome: Progressing   Problem: Nutrition: Goal: Adequate nutrition will be maintained Outcome: Progressing   Problem: Coping: Goal: Level of anxiety will decrease Outcome: Progressing   Problem: Pain Managment: Goal: General experience of comfort will improve and/or be controlled Outcome: Progressing   Problem: Safety: Goal: Ability to remain free from injury will improve Outcome: Progressing   Problem: Skin Integrity: Goal: Risk for impaired skin integrity will decrease Outcome: Progressing   Problem: Fluid Volume: Goal: Ability to maintain a balanced intake and output will improve Outcome: Progressing   Problem: Metabolic: Goal: Ability to maintain appropriate glucose levels will improve Outcome: Progressing   Problem: Nutritional: Goal: Maintenance of adequate nutrition will improve Outcome: Progressing Goal: Progress toward achieving an optimal weight will improve Outcome: Progressing   Problem: Skin Integrity: Goal: Risk for impaired skin integrity will decrease Outcome: Progressing   Problem: Tissue Perfusion: Goal: Adequacy of tissue perfusion will improve Outcome: Progressing   Problem: Cardiovascular: Goal: Ability to achieve and maintain adequate cardiovascular perfusion will improve Outcome: Progressing   Problem: Clinical Measurements: Goal: Quality of life will improve Outcome: Progressing   Problem: Respiratory: Goal: Verbalizations of increased ease of respirations will increase Outcome: Progressing   Problem: Role Relationship: Goal: Family's ability to cope with current situation will improve Outcome: Progressing   Problem: Pain Management: Goal: Satisfaction with pain management regimen will improve Outcome: Progressing   Problem: Education: Goal: Knowledge of General Education information will improve Description: Including pain rating scale,  medication(s)/side effects and non-pharmacologic comfort measures Outcome: Not Progressing   Problem: Health Behavior/Discharge Planning: Goal: Ability to manage health-related needs will improve Outcome: Not Progressing   Problem: Clinical Measurements: Goal: Ability to maintain clinical measurements within normal limits will improve Outcome: Not Progressing Goal: Will remain free from infection Outcome: Not Progressing Goal: Diagnostic test results will improve Outcome: Not Progressing Goal: Respiratory complications will improve Outcome: Not Progressing   Problem: Activity: Goal: Risk for activity intolerance will decrease Outcome: Not Progressing   Problem: Elimination: Goal: Will not experience complications related to bowel motility Outcome: Not Progressing Goal: Will not experience complications related to urinary retention Outcome: Not Progressing   Problem: Education: Goal: Ability to describe self-care measures that may prevent or decrease complications (Diabetes Survival Skills Education) will improve Outcome: Not Progressing Goal: Individualized Educational Video(s) Outcome: Not Progressing   Problem: Coping: Goal: Ability to adjust to condition or change in health will improve Outcome: Not Progressing   Problem: Health Behavior/Discharge Planning: Goal: Ability to identify and utilize available resources and services will improve Outcome: Not Progressing Goal: Ability to manage health-related needs will improve Outcome: Not Progressing   Problem: Education: Goal: Understanding of CV disease, CV risk reduction, and recovery process will improve Outcome: Not Progressing Goal: Individualized Educational Video(s) Outcome: Not Progressing   Problem: Activity: Goal: Ability to return to baseline activity level will improve Outcome: Not Progressing   Problem: Health Behavior/Discharge Planning: Goal: Ability to safely manage health-related needs after  discharge will improve Outcome: Not Progressing   Problem: Education: Goal: Knowledge of the prescribed therapeutic regimen will improve Outcome: Not Progressing   Problem: Coping: Goal: Ability to identify and develop effective coping behavior will improve Outcome: Not Progressing   Problem: Role Relationship: Goal: Ability to verbalize concerns, feelings, and thoughts to partner or family member will improve Outcome: Not Progressing

## 2024-06-25 NOTE — Discharge Summary (Signed)
 Physician Discharge Summary  Tracy Jennings FMW:[id number] DOB: August 18, 1956 DOA: 05/16/2024  PCP: Zachary Idelia LABOR, MD  Admit date: 05/16/2024  Discharge date: 06/25/2024  Admitted From: Home.  Disposition:  [facility].  Recommendations for Outpatient Follow-up:  Patient is being discharged to [facility].   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 Initial plan after general surgery evaluation was discharge with outpatient follow-up for surgical resection of fistula, but it became clear that the patient's functional status had declined so much that she was too weak to sit up in a chair for dialysis, and her hospitalization was prolonged. She subsequently then developed bleeding from her colo-uterine fistula, and GI were consulted. On flexible sigmoidoscopy, she was found to have a large malignant appearing mass at the site of the fistula. Post-sigmoidoscopy She developed heavy bleeding and was taken for embolization. Post-embolization, biopsy inconclusive, but patient's functional status continues to decline. Now medical care transitioned to full comfort care. Subsequently Patient approved for hospice facility.  Patient is being discharged to [facility].  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 difficile colitis   Pleural effusion    Discharge Instructions  Discharge Instructions     Call MD for:  difficulty breathing, headache or visual disturbances   Complete by: As directed    Call MD for:  persistant nausea and vomiting   Complete by: As directed    Diet - low sodium heart healthy   Complete by: As directed    Diet general   Complete by: As directed    Discharge instructions   Complete by: As directed    Patient is being discharged to [facility].   Increase activity slowly   Complete by: As directed    No wound care   Complete by: As directed       Allergies as of 06/25/2024       Reactions   Amlodipine  Swelling   Knees down to ankles Knees down to ankles   Hctz [hydrochlorothiazide]    pancreatitis   Heparin  Other (See Comments)   Hx  HIT,   Pt reports cardiac arrest when given heparin    Lmw Heparin     Reports cardiac arrest when given heparin    Other Other (See Comments)   Reports cardiac arrest when given during surgery Reports cardiac arrest when given during surgery Reports cardiac arrest when given during surgery   Sulfa Antibiotics Rash   Lactose Diarrhea   Per family report   Dairy Aid [tilactase]    Runny nose   Hydralazine     Nausea and rash        Medication List     STOP taking these medications    aspirin  EC 81 MG tablet  atorvastatin  40 MG tablet Commonly known as: LIPITOR   b complex-vitamin c-folic acid  0.8 MG Tabs tablet   bisacodyl  5 MG EC tablet Commonly known as: DULCOLAX   carvedilol  25 MG tablet Commonly known as: COREG    cloNIDine  0.3 mg/24hr patch Commonly known as: CATAPRES  - Dosed in mg/24 hr   diltiazem  180 MG 24 hr capsule Commonly known as: CARDIZEM  CD   doxycycline 100 MG capsule Commonly known as: VIBRAMYCIN   ferrous sulfate  325 (65 FE) MG  tablet   hydrALAZINE  25 MG tablet Commonly known as: APRESOLINE    isosorbide  mononitrate 120 MG 24 hr tablet Commonly known as: IMDUR    losartan  100 MG tablet Commonly known as: COZAAR    pantoprazole  40 MG tablet Commonly known as: PROTONIX    QUEtiapine  100 MG tablet Commonly known as: SEROQUEL    QUEtiapine  25 MG tablet Commonly known as: SEROQUEL    Senexon-S 8.6-50 MG tablet Generic drug: senna-docusate   spironolactone  25 MG tablet Commonly known as: ALDACTONE    traZODone  50 MG tablet Commonly known as: DESYREL    vancomycin  125 MG capsule Commonly known as: VANCOCIN        TAKE these medications    acetaminophen  325 MG tablet Commonly known as: TYLENOL  Take 2 tablets (650 mg total) by mouth every 4 (four) hours as needed for mild pain (pain score 1-3) or fever (or temp > 37.5 C (99.5 F)).        Contact information for after-discharge care     Destination     [facility] [place] .   Service: Skilled Nursing Contact information: [address] [place]  [phone]                    Allergies  Allergen Reactions   Amlodipine  Swelling    Knees down to ankles Knees down to ankles   Hctz [Hydrochlorothiazide]     pancreatitis   Heparin  Other (See Comments)    Hx  HIT,   Pt reports cardiac arrest when given heparin    Lmw Heparin      Reports cardiac arrest when given heparin    Other Other (See Comments)    Reports cardiac arrest when given during surgery Reports cardiac arrest when given during surgery Reports cardiac arrest when given during surgery    Sulfa Antibiotics Rash   Lactose Diarrhea    Per family report   Dairy Aid [Tilactase]     Runny nose   Hydralazine      Nausea and rash    Consultations: Palliative care Nephrology   Procedures/Studies: IR Angiogram Pelvis Selective Or Supraselective Result Date: 06/14/2024 INDICATION: Patient has a colo uterine fistula with a history of  pelvic radiation. During colonoscopy biopsy, patient began bleeding excessively. IR requested for urgent arterial embolization for active exsanguination. EXAM: Angiogram with embolization ANESTHESIA/SEDATION: General anesthesia CONTRAST:  100 mL Omnipaque  300 FLUOROSCOPY: Radiation Exposure Index (as provided by the fluoroscopic device): 495 mGy Kerma COMPLICATIONS: None immediate. PROCEDURE: Informed consent was obtained from the patient following explanation of the procedure, risks, benefits and alternatives. The patient understands, agrees and consents for the procedure. All questions were addressed. A time out was performed prior to the initiation of the procedure. Maximal barrier sterile technique utilized including caps, mask, sterile gowns, sterile gloves, large sterile drape, hand hygiene, and Betadine prep. In a supine position, the right groin was prepped and draped in the usual sterile fashion. Ultrasound was used to evaluate the right common femoral artery which was pulsatile and anechoic indicating  patency. A needle was advanced with ultrasound guidance from the skin incision to the midpoint of the right common femoral artery and a final image was obtained and stored in patient's permanent medical record. Access was then exchanged over an 018 guidewire for a micropuncture sheath. Access was then exchanged over an 035 wire which was advanced under fluoroscopic guidance for a 6 French sheath. An Omni flush catheter was advanced over the guidewire and reformed in the distal abdominal aorta. From this location a pelvic angiogram was performed. No active extravasation identified. 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 No significant identification of anterior division from the internal iliac artery on the right. Large anterior division from the left internal iliac artery. Using standard  catheter and guidewire technique, the Omni flush catheter was used to selectively cannulate the left common iliac artery. Guidewire and catheter were then advanced to the mid femoral artery on the left in order to advance the catheter and subsequently a 35 cm sheath into the common iliac artery on the left side. Selective cannulation of the left internal iliac artery was performed. An angiogram of the left internal iliac artery was then performed from the glide catheter which was placed into the proximal portion of this artery. Again no active extravasation identified, however numerous small vessels seen scattered throughout the pelvis from the anterior division. The anterior division of the internal iliac artery on the left side was then cannulated using standard catheter and guidewire technique with the glide catheter and a Glidewire. Once the catheter was in position within the distal anterior division a repeat angiogram was performed with only 1 region suggestive of active bleeding and the rest with angiogenesis from pelvic radiation. The catheter was then flushed and Gel-Foam slurry was made at the table and slowly injected through the catheter under fluoroscopic guidance for adequacy of Gel-Foam embolization. After embolization, Gel-Foam was then cleared from the catheter with the guidewire and then slow gentle flushing. Final image demonstrates slow injection of contrast and a static contrast column indicating satisfactory Gel-Foam embolization of the entire anterior division. The sheath was then partially withdrawn to the right common iliac artery and the guidewire was reversed in order to create a Waltman loop and advanced the glide catheter into the abdominal aorta. The guidewire was again reversed into its normal position and the catheter was used to selectively cannulate the right internal iliac artery. With the glide catheter tip in the right internal iliac artery, a selective angiogram of this vessel  was performed. No significant anterior division identified. The posterior division does demonstrate a few arterial branches filling the hemipelvis. Multiple attempts using coaxial technique was attempted to fill the small vessels in the right hemipelvis potentially contributing to the colonic hemorrhage without success. Using standard catheter and guidewire technique, catheter and guidewire were advanced into the distal abdominal aorta. And aortic angiogram was then performed in order to locate the inferior mesenteric artery and also scout for potential medial sacral artery. The IMA was cannulated using a Sim catheter and Glidewire. A dedicated inferior mesenteric angiogram was then performed. No identification of any active extravasation. No significant filling of the rectal arteries. Coaxial technique was used to advance the microcatheter and microwire into the superior rectal artery and a dedicated superior rectal artery angiogram was performed. This angiogram was performed separately secondary to abrupt truncation of this vessel during the initial IMA angiogram. With the microcatheter  in the superior rectal artery, no significant filling of the rectum identified. Sheath injection of the right common femoral artery was performed in order to evaluate access site prior to deployment of the Angio-Seal device. Angio-Seal was used for closure without complication. IMPRESSION: 1. Successful Gel-Foam embolization of the anterior division of the left internal iliac artery. No active extravasation identified, however embolization of the pelvic arterial anatomy from this location seem justified based on CT findings and angiogram in order to decrease the likelihood of active arterial bleed. 2. Attempted access to the right internal iliac artery anterior division was unsuccessful. No active extravasation and overall decreased arterial filling compared to the left. 3. IMA and superior rectal artery angiogram fail to  demonstrate any active extravasation. 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 During colonoscopy biopsy, patient began bleeding excessively. IR requested for urgent arterial embolization for active exsanguination. EXAM: Angiogram with embolization ANESTHESIA/SEDATION: General anesthesia CONTRAST:  100 mL Omnipaque  300 FLUOROSCOPY: Radiation Exposure Index (as provided by the fluoroscopic device): 495 mGy Kerma COMPLICATIONS: None immediate. PROCEDURE: Informed consent was obtained from the patient following explanation of the procedure, risks, benefits and alternatives. The patient understands, agrees and consents for the procedure. All questions were addressed. A time out was performed prior to the initiation of the procedure. Maximal barrier sterile technique utilized including caps, mask, sterile gowns, sterile gloves, large sterile drape, hand hygiene, and Betadine prep. In a supine position, the right groin was prepped and draped in the usual sterile fashion. Ultrasound was used to evaluate the right common femoral artery which was pulsatile and anechoic indicating patency. A needle was advanced with ultrasound guidance from the skin incision to the midpoint of the right common femoral artery and a final image was obtained and stored in patient's permanent medical record. Access was then exchanged over an 018 guidewire for a micropuncture sheath. Access was then exchanged over an 035 wire which was advanced under fluoroscopic guidance for a 6 French sheath. An Omni flush catheter was advanced over the guidewire and reformed in the distal abdominal aorta. From this location a pelvic angiogram was performed. No active extravasation identified. Predominant arterial flow within the pelvis occurs from small arterial  tributaries consistent with angiogenesis likely from patient's history of pelvic radiation. No significant identification of anterior division from the internal iliac artery on the right. Large anterior division from the left internal iliac artery. Using standard catheter and guidewire technique, the Omni flush catheter was used to selectively cannulate the left common iliac artery. Guidewire and catheter were then advanced to the mid femoral artery on the left in order to advance the catheter and subsequently a 35 cm sheath into the common iliac artery on the left side. Selective cannulation of the left internal iliac artery was performed. An angiogram of the left internal iliac artery was then performed from the glide catheter which was placed into the proximal portion of this artery. Again no active extravasation identified, however numerous small vessels seen scattered throughout the pelvis from the anterior division. The anterior division of the internal iliac artery on the left side was then cannulated using standard catheter and guidewire technique with the glide catheter and a Glidewire. Once the catheter was in position within the distal anterior division a repeat angiogram was performed  with only 1 region suggestive of active bleeding and the rest with angiogenesis from pelvic radiation. The catheter was then flushed and Gel-Foam slurry was made at the table and slowly injected through the catheter under fluoroscopic guidance for adequacy of Gel-Foam embolization. After embolization, Gel-Foam was then cleared from the catheter with the guidewire and then slow gentle flushing. Final image demonstrates slow injection of contrast and a static contrast column indicating satisfactory Gel-Foam embolization of the entire anterior division. The sheath was then partially withdrawn to the right common iliac artery and the guidewire was reversed in order to create a Waltman loop and advanced the glide catheter into  the abdominal aorta. The guidewire was again reversed into its normal position and the catheter was used to selectively cannulate the right internal iliac artery. With the glide catheter tip in the right internal iliac artery, a selective angiogram of this vessel was performed. No significant anterior division identified. The posterior division does demonstrate a few arterial branches filling the hemipelvis. Multiple attempts using coaxial technique was attempted to fill the small vessels in the right hemipelvis potentially contributing to the colonic hemorrhage without success. Using standard catheter and guidewire technique, catheter and guidewire were advanced into the distal abdominal aorta. And aortic angiogram was then performed in order to locate the inferior mesenteric artery and also scout for potential medial sacral artery. The IMA was cannulated using a Sim catheter and Glidewire. A dedicated inferior mesenteric angiogram was then performed. No identification of any active extravasation. No significant filling of the rectal arteries. Coaxial technique was used to advance the microcatheter and microwire into the superior rectal artery and a dedicated superior rectal artery angiogram was performed. This angiogram was performed separately secondary to abrupt truncation of this vessel during the initial IMA angiogram. With the microcatheter in the superior rectal artery, no significant filling of the rectum identified. Sheath injection of the right common femoral artery was performed in order to evaluate access site prior to deployment of the Angio-Seal device. Angio-Seal was used for closure without complication. IMPRESSION: 1. Successful Gel-Foam embolization of the anterior division of the left internal iliac artery. No active extravasation identified, however embolization of the pelvic arterial anatomy from this location seem justified based on CT findings and angiogram in order to decrease the likelihood  of active arterial bleed. 2. Attempted access to the right internal iliac artery anterior division was unsuccessful. No active extravasation and overall decreased arterial filling compared to the left. 3. IMA and superior rectal artery angiogram fail to demonstrate any active extravasation. 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 During colonoscopy biopsy, patient began bleeding excessively. IR requested for urgent arterial embolization for active exsanguination. EXAM: Angiogram with embolization ANESTHESIA/SEDATION: General anesthesia CONTRAST:  100 mL Omnipaque  300 FLUOROSCOPY: Radiation Exposure Index (as provided by the fluoroscopic device): 495 mGy Kerma COMPLICATIONS: None immediate. PROCEDURE: Informed consent was obtained from the patient following explanation of the procedure, risks, benefits and alternatives. The patient understands, agrees and consents for the procedure. All questions were addressed. A time out was performed prior to the initiation of the procedure. Maximal barrier sterile technique utilized including caps, mask, sterile gowns, sterile gloves, large sterile drape, hand hygiene, and Betadine prep. In a supine position, the right groin was prepped and draped in the usual sterile fashion. Ultrasound was  used to evaluate the right common femoral artery which was pulsatile and anechoic indicating patency. A needle was advanced with ultrasound guidance from the skin incision to the midpoint of the right common femoral artery and a final image was obtained and stored in patient's permanent medical record. Access was then exchanged over an 018 guidewire for a micropuncture sheath. Access was then exchanged over an 035 wire which was advanced under fluoroscopic guidance for a 6 French sheath. An Omni flush catheter was advanced  over the guidewire and reformed in the distal abdominal aorta. From this location a pelvic angiogram was performed. No active extravasation identified. 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 No significant identification of anterior division from the internal iliac artery on the right. Large anterior division from the left internal iliac artery. Using standard catheter and guidewire technique, the Omni flush catheter was used to selectively cannulate the left common iliac artery. Guidewire and catheter were then advanced to the mid femoral artery on the left in order to advance the catheter and subsequently a 35 cm sheath into the common iliac artery on the left side. Selective cannulation of the left internal iliac artery was performed. An angiogram of the left internal iliac artery was then performed from the glide catheter which was placed into the proximal portion of this artery. Again no active extravasation identified, however numerous small vessels seen scattered throughout the pelvis from the anterior division. The anterior division of the internal iliac artery on the left side was then cannulated using standard catheter and guidewire technique with the glide catheter and a Glidewire. Once the catheter was in position within the distal anterior division a repeat angiogram was performed with only 1 region suggestive of active bleeding and the rest with angiogenesis from pelvic radiation. The catheter was then flushed and Gel-Foam slurry was made at the table and slowly injected through the catheter under fluoroscopic guidance for adequacy of Gel-Foam embolization. After embolization, Gel-Foam was then cleared from the catheter with the guidewire and then slow gentle flushing. Final image demonstrates slow injection of contrast and a static contrast column indicating satisfactory Gel-Foam  embolization of the entire anterior division. The sheath was then partially withdrawn to the right common iliac artery and the guidewire was reversed in order to create a Waltman loop and advanced the glide catheter into the abdominal aorta. The guidewire was again reversed into its normal position and the catheter was used to selectively cannulate the right internal iliac artery. With the glide catheter tip in the right internal iliac artery, a selective angiogram of this vessel was performed. No significant anterior division identified. The posterior division does demonstrate a few arterial branches filling the hemipelvis. Multiple attempts using coaxial technique was attempted to fill the small vessels in the right hemipelvis potentially contributing to the colonic hemorrhage without success. Using standard catheter and guidewire technique, catheter and guidewire were advanced into the distal abdominal aorta. And aortic angiogram was then performed in order to locate the inferior mesenteric artery and also scout for potential medial sacral artery. The IMA was cannulated using a Sim catheter and Glidewire. A dedicated inferior mesenteric angiogram was then performed. No identification of any active extravasation. No significant filling of the rectal arteries. Coaxial technique was used to advance the microcatheter and microwire into the superior rectal artery and a dedicated superior rectal artery angiogram was performed. This angiogram was performed separately secondary  to abrupt truncation of this vessel during the initial IMA angiogram. With the microcatheter in the superior rectal artery, no significant filling of the rectum identified. Sheath injection of the right common femoral artery was performed in order to evaluate access site prior to deployment of the Angio-Seal device. Angio-Seal was used for closure without complication. IMPRESSION: 1. Successful Gel-Foam embolization of the anterior division of the  left internal iliac artery. No active extravasation identified, however embolization of the pelvic arterial anatomy from this location seem justified based on CT findings and angiogram in order to decrease the likelihood of active arterial bleed. 2. Attempted access to the right internal iliac artery anterior division was unsuccessful. No active extravasation and overall decreased arterial filling compared to the left. 3. IMA and superior rectal artery angiogram fail to demonstrate any active extravasation. 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 During colonoscopy biopsy, patient began bleeding excessively. IR requested for urgent arterial embolization for active exsanguination. EXAM: Angiogram with embolization ANESTHESIA/SEDATION: General anesthesia CONTRAST:  100 mL Omnipaque  300 FLUOROSCOPY: Radiation Exposure Index (as provided by the fluoroscopic device): 495 mGy Kerma COMPLICATIONS: None immediate. PROCEDURE: Informed consent was obtained from the patient following explanation of the procedure, risks, benefits and alternatives. The patient understands, agrees and consents for the procedure. All questions were addressed. A time out was performed prior to the initiation of the procedure. Maximal barrier sterile technique utilized including caps, mask, sterile gowns, sterile gloves, large sterile drape, hand hygiene, and Betadine prep. In a supine position, the right groin was prepped and draped in the usual sterile fashion. Ultrasound was used to evaluate the right common femoral artery which was pulsatile and anechoic indicating patency. A needle was advanced with ultrasound guidance from the skin incision to the midpoint of the right common femoral artery and a final image was obtained and stored in patient's permanent  medical record. Access was then exchanged over an 018 guidewire for a micropuncture sheath. Access was then exchanged over an 035 wire which was advanced under fluoroscopic guidance for a 6 French sheath. An Omni flush catheter was advanced over the guidewire and reformed in the distal abdominal aorta. From this location a pelvic angiogram was performed. No active extravasation identified. 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 No significant identification of anterior division from the internal iliac artery on the right. Large anterior division from the left internal iliac artery. Using standard catheter and guidewire technique, the Omni flush catheter was used to selectively cannulate the left common iliac artery. Guidewire and catheter were then advanced to the mid femoral artery on the left in order to advance the catheter and subsequently a 35 cm sheath into the common iliac artery on the left side. Selective cannulation of the left internal iliac artery was performed. An angiogram of the left internal iliac artery was then performed from the glide catheter which was placed into the proximal portion of this artery. Again no active extravasation identified, however numerous small vessels seen scattered throughout the pelvis from the anterior division. The anterior division of the internal iliac artery on the left side was then cannulated using standard catheter and guidewire technique with the glide catheter and a Glidewire. Once the catheter was in position within the  distal anterior division a repeat angiogram was performed with only 1 region suggestive of active bleeding and the rest with angiogenesis from pelvic radiation. The catheter was then flushed and Gel-Foam slurry was made at the table and slowly injected through the catheter under fluoroscopic guidance for adequacy of Gel-Foam  embolization. After embolization, Gel-Foam was then cleared from the catheter with the guidewire and then slow gentle flushing. Final image demonstrates slow injection of contrast and a static contrast column indicating satisfactory Gel-Foam embolization of the entire anterior division. The sheath was then partially withdrawn to the right common iliac artery and the guidewire was reversed in order to create a Waltman loop and advanced the glide catheter into the abdominal aorta. The guidewire was again reversed into its normal position and the catheter was used to selectively cannulate the right internal iliac artery. With the glide catheter tip in the right internal iliac artery, a selective angiogram of this vessel was performed. No significant anterior division identified. The posterior division does demonstrate a few arterial branches filling the hemipelvis. Multiple attempts using coaxial technique was attempted to fill the small vessels in the right hemipelvis potentially contributing to the colonic hemorrhage without success. Using standard catheter and guidewire technique, catheter and guidewire were advanced into the distal abdominal aorta. And aortic angiogram was then performed in order to locate the inferior mesenteric artery and also scout for potential medial sacral artery. The IMA was cannulated using a Sim catheter and Glidewire. A dedicated inferior mesenteric angiogram was then performed. No identification of any active extravasation. No significant filling of the rectal arteries. Coaxial technique was used to advance the microcatheter and microwire into the superior rectal artery and a dedicated superior rectal artery angiogram was performed. This angiogram was performed separately secondary to abrupt truncation of this vessel during the initial IMA angiogram. With the microcatheter in the superior rectal artery, no significant filling of the rectum identified. Sheath injection of the right common  femoral artery was performed in order to evaluate access site prior to deployment of the Angio-Seal device. Angio-Seal was used for closure without complication. IMPRESSION: 1. Successful Gel-Foam embolization of the anterior division of the left internal iliac artery. No active extravasation identified, however embolization of the pelvic arterial anatomy from this location seem justified based on CT findings and angiogram in order to decrease the likelihood of active arterial bleed. 2. Attempted access to the right internal iliac artery anterior division was unsuccessful. No active extravasation and overall decreased arterial filling compared to the left. 3. IMA and superior rectal artery angiogram fail to demonstrate any active extravasation. 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 During colonoscopy biopsy, patient began bleeding excessively. IR requested for urgent arterial embolization for active exsanguination. EXAM: Angiogram with embolization ANESTHESIA/SEDATION: General anesthesia CONTRAST:  100 mL Omnipaque  300 FLUOROSCOPY: Radiation Exposure Index (as provided by the fluoroscopic device): 495 mGy Kerma COMPLICATIONS: None immediate. PROCEDURE: Informed consent was obtained from the patient following explanation of the procedure, risks, benefits and alternatives. The patient understands, agrees and consents for the procedure. All questions were addressed. A time out was performed prior to the initiation of the procedure. Maximal barrier sterile technique utilized including caps, mask, sterile gowns, sterile gloves, large sterile drape, hand hygiene, and Betadine prep. In a supine position, the right groin was prepped  and draped in the usual sterile fashion. Ultrasound was used to evaluate the right common femoral artery which was  pulsatile and anechoic indicating patency. A needle was advanced with ultrasound guidance from the skin incision to the midpoint of the right common femoral artery and a final image was obtained and stored in patient's permanent medical record. Access was then exchanged over an 018 guidewire for a micropuncture sheath. Access was then exchanged over an 035 wire which was advanced under fluoroscopic guidance for a 6 French sheath. An Omni flush catheter was advanced over the guidewire and reformed in the distal abdominal aorta. From this location a pelvic angiogram was performed. No active extravasation identified. 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 No significant identification of anterior division from the internal iliac artery on the right. Large anterior division from the left internal iliac artery. Using standard catheter and guidewire technique, the Omni flush catheter was used to selectively cannulate the left common iliac artery. Guidewire and catheter were then advanced to the mid femoral artery on the left in order to advance the catheter and subsequently a 35 cm sheath into the common iliac artery on the left side. Selective cannulation of the left internal iliac artery was performed. An angiogram of the left internal iliac artery was then performed from the glide catheter which was placed into the proximal portion of this artery. Again no active extravasation identified, however numerous small vessels seen scattered throughout the pelvis from the anterior division. The anterior division of the internal iliac artery on the left side was then cannulated using standard catheter and guidewire technique with the glide catheter and a Glidewire. Once the catheter was in position within the distal anterior division a repeat angiogram was performed with only 1 region suggestive of active  bleeding and the rest with angiogenesis from pelvic radiation. The catheter was then flushed and Gel-Foam slurry was made at the table and slowly injected through the catheter under fluoroscopic guidance for adequacy of Gel-Foam embolization. After embolization, Gel-Foam was then cleared from the catheter with the guidewire and then slow gentle flushing. Final image demonstrates slow injection of contrast and a static contrast column indicating satisfactory Gel-Foam embolization of the entire anterior division. The sheath was then partially withdrawn to the right common iliac artery and the guidewire was reversed in order to create a Waltman loop and advanced the glide catheter into the abdominal aorta. The guidewire was again reversed into its normal position and the catheter was used to selectively cannulate the right internal iliac artery. With the glide catheter tip in the right internal iliac artery, a selective angiogram of this vessel was performed. No significant anterior division identified. The posterior division does demonstrate a few arterial branches filling the hemipelvis. Multiple attempts using coaxial technique was attempted to fill the small vessels in the right hemipelvis potentially contributing to the colonic hemorrhage without success. Using standard catheter and guidewire technique, catheter and guidewire were advanced into the distal abdominal aorta. And aortic angiogram was then performed in order to locate the inferior mesenteric artery and also scout for potential medial sacral artery. The IMA was cannulated using a Sim catheter and Glidewire. A dedicated inferior mesenteric angiogram was then performed. No identification of any active extravasation. No significant filling of the rectal arteries. Coaxial technique was used to advance the microcatheter and microwire into the superior rectal artery and a dedicated superior rectal artery  angiogram was performed. This angiogram was performed  separately secondary to abrupt truncation of this vessel during the initial IMA angiogram. With the microcatheter in the superior rectal artery, no significant filling of the rectum identified. Sheath injection of the right common femoral artery was performed in order to evaluate access site prior to deployment of the Angio-Seal device. Angio-Seal was used for closure without complication. IMPRESSION: 1. Successful Gel-Foam embolization of the anterior division of the left internal iliac artery. No active extravasation identified, however embolization of the pelvic arterial anatomy from this location seem justified based on CT findings and angiogram in order to decrease the likelihood of active arterial bleed. 2. Attempted access to the right internal iliac artery anterior division was unsuccessful. No active extravasation and overall decreased arterial filling compared to the left. 3. IMA and superior rectal artery angiogram fail to demonstrate any active extravasation. 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 During colonoscopy biopsy, patient began bleeding excessively. IR requested for urgent arterial embolization for active exsanguination. EXAM: Angiogram with embolization ANESTHESIA/SEDATION: General anesthesia CONTRAST:  100 mL Omnipaque  300 FLUOROSCOPY: Radiation Exposure Index (as provided by the fluoroscopic device): 495 mGy Kerma COMPLICATIONS: None immediate. PROCEDURE: Informed consent was obtained from the patient following explanation of the procedure, risks, benefits and alternatives. The patient understands, agrees and consents for the procedure. All questions were addressed. A time out was performed prior to the initiation of the procedure. Maximal barrier sterile technique utilized including caps, mask, sterile gowns, sterile  gloves, large sterile drape, hand hygiene, and Betadine prep. In a supine position, the right groin was prepped and draped in the usual sterile fashion. Ultrasound was used to evaluate the right common femoral artery which was pulsatile and anechoic indicating patency. A needle was advanced with ultrasound guidance from the skin incision to the midpoint of the right common femoral artery and a final image was obtained and stored in patient's permanent medical record. Access was then exchanged over an 018 guidewire for a micropuncture sheath. Access was then exchanged over an 035 wire which was advanced under fluoroscopic guidance for a 6 French sheath. An Omni flush catheter was advanced over the guidewire and reformed in the distal abdominal aorta. From this location a pelvic angiogram was performed. No active extravasation identified. 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 No significant identification of anterior division from the internal iliac artery on the right. Large anterior division from the left internal iliac artery. Using standard catheter and guidewire technique, the Omni flush catheter was used to selectively cannulate the left common iliac artery. Guidewire and catheter were then advanced to the mid femoral artery on the left in order to advance the catheter and subsequently a 35 cm sheath into the common iliac artery on the left side. Selective cannulation of the left internal iliac artery was performed. An angiogram of the left internal iliac artery was then performed from the glide catheter which was placed into the proximal portion of this artery. Again no active extravasation identified, however numerous small vessels seen scattered throughout the pelvis from the anterior division. The anterior division of the internal iliac artery on the left side was then cannulated using  standard catheter and guidewire technique with the glide catheter and a Glidewire.  Once the catheter was in position within the distal anterior division a repeat angiogram was performed with only 1 region suggestive of active bleeding and the rest with angiogenesis from pelvic radiation. The catheter was then flushed and Gel-Foam slurry was made at the table and slowly injected through the catheter under fluoroscopic guidance for adequacy of Gel-Foam embolization. After embolization, Gel-Foam was then cleared from the catheter with the guidewire and then slow gentle flushing. Final image demonstrates slow injection of contrast and a static contrast column indicating satisfactory Gel-Foam embolization of the entire anterior division. The sheath was then partially withdrawn to the right common iliac artery and the guidewire was reversed in order to create a Waltman loop and advanced the glide catheter into the abdominal aorta. The guidewire was again reversed into its normal position and the catheter was used to selectively cannulate the right internal iliac artery. With the glide catheter tip in the right internal iliac artery, a selective angiogram of this vessel was performed. No significant anterior division identified. The posterior division does demonstrate a few arterial branches filling the hemipelvis. Multiple attempts using coaxial technique was attempted to fill the small vessels in the right hemipelvis potentially contributing to the colonic hemorrhage without success. Using standard catheter and guidewire technique, catheter and guidewire were advanced into the distal abdominal aorta. And aortic angiogram was then performed in order to locate the inferior mesenteric artery and also scout for potential medial sacral artery. The IMA was cannulated using a Sim catheter and Glidewire. A dedicated inferior mesenteric angiogram was then performed. No identification of any active extravasation. No significant  filling of the rectal arteries. Coaxial technique was used to advance the microcatheter and microwire into the superior rectal artery and a dedicated superior rectal artery angiogram was performed. This angiogram was performed separately secondary to abrupt truncation of this vessel during the initial IMA angiogram. With the microcatheter in the superior rectal artery, no significant filling of the rectum identified. Sheath injection of the right common femoral artery was performed in order to evaluate access site prior to deployment of the Angio-Seal device. Angio-Seal was used for closure without complication. IMPRESSION: 1. Successful Gel-Foam embolization of the anterior division of the left internal iliac artery. No active extravasation identified, however embolization of the pelvic arterial anatomy from this location seem justified based on CT findings and angiogram in order to decrease the likelihood of active arterial bleed. 2. Attempted access to the right internal iliac artery anterior division was unsuccessful. No active extravasation and overall decreased arterial filling compared to the left. 3. IMA and superior rectal artery angiogram fail to demonstrate any active extravasation. Electronically Signed   By: Cordella Banner   On: 06/14/2024 11:01   CT ANGIO GI BLEED Result Date: 06/11/2024 CLINICAL DATA:  Lower GI bleed, cervical cancer * Tracking Code: BO * EXAM: CTA ABDOMEN AND PELVIS WITHOUT AND WITH CONTRAST TECHNIQUE: Multidetector CT imaging of the abdomen and pelvis was performed using the standard protocol during bolus administration of intravenous contrast. Multiplanar reconstructed images and MIPs were obtained and reviewed to evaluate the vascular anatomy. RADIATION DOSE REDUCTION: This exam was performed according to the departmental dose-optimization program which includes automated exposure control, adjustment of the mA and/or kV according to patient size and/or use of iterative  reconstruction technique. CONTRAST:  75mL OMNIPAQUE  IOHEXOL  350 MG/ML SOLN COMPARISON:  05/16/2024 FINDINGS: VASCULAR Normal contour and caliber of the abdominal aorta. Moderate aortic atherosclerosis and vascular calcinosis. No evidence of aneurysm, dissection, or  other acute aortic pathology. Standard branching pattern of the abdominal aorta with solitary bilateral renal arteries. Review of the MIP images confirms the above findings. NON-VASCULAR Lower Chest: Unchanged moderate left pleural effusion associated atelectasis or consolidation. Coronary artery calcifications. Aortic valve calcifications. Hepatobiliary: No focal liver abnormality is seen. Status post cholecystectomy. No biliary dilatation. Pancreas: Unremarkable. No pancreatic ductal dilatation or surrounding inflammatory changes. Spleen: Normal in size without significant abnormality. Adrenals/Urinary Tract: Benign adenomatous thickening of the adrenal glands. Atrophic kidneys. Renal vascular calcifications without calculi. No hydronephrosis. Bladder is unremarkable. Stomach/Bowel: Stomach is within normal limits. Appendix appears normal. 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 Other: No abdominal wall hernia.  Anasarca.  No ascites. Musculoskeletal: No acute osseous findings. IMPRESSION: 1. No evidence of intraluminal contrast extravasation. 2. 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 3. Unchanged enlarged inferior mesenteric lymph nodes, concerning for nodal metastatic disease. 4. Unchanged moderate left pleural effusion and associated atelectasis or consolidation. 5. Coronary artery disease. 6. Aortic valve  calcifications. Correlate for echocardiographic evidence of aortic valve dysfunction. Aortic Atherosclerosis (ICD10-I70.0). Electronically Signed   By: Marolyn JONETTA Jaksch M.D.   On: 06/11/2024 21:14    Subjective: Patient was seen and examined at bedside.  Overnight events noted. Patient reports feeling fair.  Patient being discharged to [facility].  Discharge Exam: Vitals:   06/24/24 0630 06/25/24 0630  BP: (!) 142/71 (!) 193/63  Pulse: 70   Resp: 17   Temp: (!) 97.4 F (36.3 C)   SpO2: 94%    Vitals:   06/22/24 0538 06/23/24 0417 06/24/24 0630 06/25/24 0630  BP: (!) 223/78 131/64 (!) 142/71 (!) 193/63  Pulse: 92  70   Resp: 16 16 17    Temp: 97.6 F (36.4 C) (!) 97.4 F (36.3 C) (!) 97.4 F (36.3 C)   TempSrc: Oral  Oral   SpO2: 100%  94%   Weight:      Height:        General: Pt is alert, awake, not in acute distress Cardiovascular: RRR, S1/S2 +, no rubs, no gallops Respiratory: CTA bilaterally, no wheezing, no rhonchi Abdominal: Soft, NT, ND, bowel sounds + Extremities: no edema, no cyanosis    The results of significant diagnostics from this hospitalization (including imaging, microbiology, ancillary and laboratory) are listed below for reference.     Microbiology: No results found for this or any previous visit (from the past 240 hours).   Labs: BNP (last 3 results) No results for input(s): BNP in the last 8760 hours. Basic Metabolic Panel: Recent Labs  Lab 06/20/24 0310  NA 136  K 3.8  CL 97*  CO2 27  GLUCOSE 79  BUN 30*  CREATININE 3.86*  CALCIUM  7.9*   Liver Function Tests: No results for input(s): AST, ALT, ALKPHOS, BILITOT, PROT, ALBUMIN in the last 168 hours. No results for input(s): LIPASE, AMYLASE in the last 168 hours. No results for input(s): AMMONIA in the last 168 hours. CBC: Recent Labs  Lab 06/19/24 0643  WBC 17.7*  NEUTROABS 15.4*  HGB 10.6*  HCT 33.2*  MCV 94.1  PLT 221   Cardiac Enzymes: No  results for input(s): CKTOTAL, CKMB, CKMBINDEX, TROPONINI in the last 168  hours. BNP: Invalid input(s): POCBNP CBG: Recent Labs  Lab 06/19/24 0601 06/19/24 0656  GLUCAP 71 72   D-Dimer No results for input(s): DDIMER in the last 72 hours. Hgb A1c No results for input(s): HGBA1C in the last 72 hours. Lipid Profile No results for input(s): CHOL, HDL, LDLCALC, TRIG, CHOLHDL, LDLDIRECT in the last 72 hours. Thyroid function studies No results for input(s): TSH, T4TOTAL, T3FREE, THYROIDAB in the last 72 hours.  Invalid input(s): FREET3 Anemia work up No results for input(s): VITAMINB12, FOLATE, FERRITIN, TIBC, IRON, RETICCTPCT in the last 72 hours. Urinalysis    Component Value Date/Time   COLORURINE YELLOW (A) 10/31/2017 1959   APPEARANCEUR HAZY (A) 10/31/2017 1959   APPEARANCEUR Hazy 12/09/2013 1046   LABSPEC 1.017 10/31/2017 1959   LABSPEC 1.031 12/09/2013 1046   PHURINE 6.0 10/31/2017 1959   GLUCOSEU >=500 (A) 10/31/2017 1959   GLUCOSEU >=500 12/09/2013 1046   HGBUR NEGATIVE 10/31/2017 1959   BILIRUBINUR NEGATIVE 10/31/2017 1959   BILIRUBINUR Negative 12/09/2013 1046   KETONESUR NEGATIVE 10/31/2017 1959   PROTEINUR >=300 (A) 10/31/2017 1959   NITRITE NEGATIVE 10/31/2017 1959   LEUKOCYTESUR NEGATIVE 10/31/2017 1959   LEUKOCYTESUR Negative 12/09/2013 1046   Sepsis Labs Recent Labs  Lab 06/19/24 0643  WBC 17.7*   Microbiology No results found for this or any previous visit (from the past 240 hours).   Time coordinating discharge: Over 30 minutes  SIGNED:   Darcel Dawley, MD  [facility] 06/25/2024, 4:40 PM Pager   If 7PM-7AM, please contact night-coverage

## 2024-06-25 NOTE — Progress Notes (Signed)
 [facility] here to pick up patient.  Patient being transferred to [facility] in [place], [place].   Patient alert & O x 1 at this time.  Report called to Clancy, RN at 2230. Phone #[phone].  Called to notify patient's son, [name] @ [phone] but no answer. Left message to return call to Boby Barge, LPN

## 2024-06-26 NOTE — Progress Notes (Signed)
 Late Note Entry- June 26, 2024  Contacted [facility] to advise clinic staff that pt was d/c to hospice home yesterday. Clinic requested d/c summary for documentation regarding pt being comfort care. D/C summary faxed to clinic per their request.   Randine Mungo Dialysis Navigator [phone]

## 2024-07-13 DEATH — deceased
